# Patient Record
Sex: Male | Born: 1940
Health system: Southern US, Community
[De-identification: ages and names within clinical notes are randomized; demographics above are authoritative.]

## PROBLEM LIST (undated history)

## (undated) DIAGNOSIS — E139 Other specified diabetes mellitus without complications: Secondary | ICD-10-CM

## (undated) DIAGNOSIS — E1042 Type 1 diabetes mellitus with diabetic polyneuropathy: Secondary | ICD-10-CM

## (undated) DIAGNOSIS — E785 Hyperlipidemia, unspecified: Secondary | ICD-10-CM

## (undated) DIAGNOSIS — N183 Chronic kidney disease, stage 3 unspecified: Secondary | ICD-10-CM

## (undated) DIAGNOSIS — K274 Chronic or unspecified peptic ulcer, site unspecified, with hemorrhage: Secondary | ICD-10-CM

## (undated) DIAGNOSIS — Z951 Presence of aortocoronary bypass graft: Secondary | ICD-10-CM

## (undated) DIAGNOSIS — R0609 Other forms of dyspnea: Secondary | ICD-10-CM

## (undated) DIAGNOSIS — I350 Nonrheumatic aortic (valve) stenosis: Secondary | ICD-10-CM

## (undated) DIAGNOSIS — R600 Localized edema: Secondary | ICD-10-CM

## (undated) DIAGNOSIS — J189 Pneumonia, unspecified organism: Secondary | ICD-10-CM

## (undated) DIAGNOSIS — I10 Essential (primary) hypertension: Secondary | ICD-10-CM

## (undated) DIAGNOSIS — G4733 Obstructive sleep apnea (adult) (pediatric): Secondary | ICD-10-CM

## (undated) DIAGNOSIS — M109 Gout, unspecified: Secondary | ICD-10-CM

## (undated) DIAGNOSIS — I639 Cerebral infarction, unspecified: Secondary | ICD-10-CM

## (undated) DIAGNOSIS — I219 Acute myocardial infarction, unspecified: Secondary | ICD-10-CM

## (undated) DIAGNOSIS — R1310 Dysphagia, unspecified: Secondary | ICD-10-CM

## (undated) DIAGNOSIS — G629 Polyneuropathy, unspecified: Secondary | ICD-10-CM

## (undated) DIAGNOSIS — E6609 Other obesity due to excess calories: Secondary | ICD-10-CM

## (undated) DIAGNOSIS — R413 Other amnesia: Secondary | ICD-10-CM

## (undated) DIAGNOSIS — IMO0001 Reserved for inherently not codable concepts without codable children: Secondary | ICD-10-CM

## (undated) DIAGNOSIS — E119 Type 2 diabetes mellitus without complications: Secondary | ICD-10-CM

## (undated) DIAGNOSIS — I2510 Atherosclerotic heart disease of native coronary artery without angina pectoris: Secondary | ICD-10-CM

## (undated) DIAGNOSIS — I5032 Chronic diastolic (congestive) heart failure: Secondary | ICD-10-CM

## (undated) DIAGNOSIS — D696 Thrombocytopenia, unspecified: Secondary | ICD-10-CM

## (undated) DIAGNOSIS — E039 Hypothyroidism, unspecified: Secondary | ICD-10-CM

## (undated) DIAGNOSIS — M6282 Rhabdomyolysis: Secondary | ICD-10-CM

## (undated) DIAGNOSIS — E669 Obesity, unspecified: Secondary | ICD-10-CM

## (undated) DIAGNOSIS — Z794 Long term (current) use of insulin: Secondary | ICD-10-CM

## (undated) DIAGNOSIS — I872 Venous insufficiency (chronic) (peripheral): Secondary | ICD-10-CM

## (undated) DIAGNOSIS — I358 Other nonrheumatic aortic valve disorders: Secondary | ICD-10-CM

## (undated) DIAGNOSIS — R531 Weakness: Secondary | ICD-10-CM

## (undated) DIAGNOSIS — M21372 Foot drop, left foot: Secondary | ICD-10-CM

## (undated) DIAGNOSIS — Z9989 Dependence on other enabling machines and devices: Secondary | ICD-10-CM

## (undated) DIAGNOSIS — Z9289 Personal history of other medical treatment: Secondary | ICD-10-CM

## (undated) DIAGNOSIS — C801 Malignant (primary) neoplasm, unspecified: Secondary | ICD-10-CM

## (undated) HISTORY — DX: Atherosclerotic heart disease of native coronary artery without angina pectoris: I25.10

## (undated) HISTORY — DX: Other obesity due to excess calories: E66.09

## (undated) HISTORY — DX: Obstructive sleep apnea (adult) (pediatric): Z99.89

## (undated) HISTORY — DX: Hyperlipidemia, unspecified: E78.5

## (undated) HISTORY — DX: Obstructive sleep apnea (adult) (pediatric): G47.33

## (undated) HISTORY — PX: COLONOSCOPY: SHX174

## (undated) HISTORY — DX: Other amnesia: R41.3

## (undated) HISTORY — PX: SINUS ENDO W/FUSION: SHX777

## (undated) HISTORY — DX: Essential (primary) hypertension: I10

## (undated) HISTORY — DX: Polyneuropathy, unspecified: G62.9

## (undated) HISTORY — DX: Type 2 diabetes mellitus without complications: E11.9

## (undated) HISTORY — DX: Venous insufficiency (chronic) (peripheral): I87.2

## (undated) HISTORY — PX: ROTATOR CUFF REPAIR: SHX139

## (undated) HISTORY — DX: Long term (current) use of insulin: Z79.4

## (undated) HISTORY — PX: EYE SURGERY: SHX253

## (undated) HISTORY — PX: TONSILLECTOMY: SUR1361

## (undated) HISTORY — DX: Rhabdomyolysis: M62.82

## (undated) HISTORY — DX: Foot drop, left foot: M21.372

## (undated) HISTORY — PX: CHOLECYSTECTOMY: SHX55

## (undated) HISTORY — DX: Personal history of other medical treatment: Z92.89

## (undated) HISTORY — DX: Reserved for inherently not codable concepts without codable children: IMO0001

## (undated) HISTORY — DX: Cerebral infarction, unspecified: I63.9

## (undated) HISTORY — DX: Gout, unspecified: M10.9

---

## 1989-09-21 HISTORY — PX: CORONARY ANGIOPLASTY: SHX604

## 1996-10-13 HISTORY — PX: CORONARY ANGIOPLASTY: SHX604

## 1997-12-03 HISTORY — PX: CORONARY ANGIOPLASTY: SHX604

## 1999-02-01 ENCOUNTER — Ambulatory Visit (HOSPITAL_COMMUNITY): Admission: RE | Admit: 1999-02-01 | Discharge: 1999-02-01 | Payer: Self-pay | Admitting: Neurological Surgery

## 1999-02-01 ENCOUNTER — Encounter: Payer: Self-pay | Admitting: Neurological Surgery

## 1999-02-21 ENCOUNTER — Encounter: Payer: Self-pay | Admitting: Neurological Surgery

## 1999-02-25 ENCOUNTER — Inpatient Hospital Stay (HOSPITAL_COMMUNITY): Admission: RE | Admit: 1999-02-25 | Discharge: 1999-02-25 | Payer: Self-pay | Admitting: Neurological Surgery

## 1999-02-25 ENCOUNTER — Encounter: Payer: Self-pay | Admitting: Neurological Surgery

## 1999-05-19 ENCOUNTER — Ambulatory Visit (HOSPITAL_COMMUNITY): Admission: RE | Admit: 1999-05-19 | Discharge: 1999-05-19 | Payer: Self-pay | Admitting: Neurological Surgery

## 1999-05-19 ENCOUNTER — Encounter: Payer: Self-pay | Admitting: Neurological Surgery

## 1999-06-30 ENCOUNTER — Ambulatory Visit (HOSPITAL_COMMUNITY): Admission: RE | Admit: 1999-06-30 | Discharge: 1999-07-01 | Payer: Self-pay | Admitting: Neurological Surgery

## 1999-06-30 ENCOUNTER — Encounter: Payer: Self-pay | Admitting: Neurological Surgery

## 1999-10-14 ENCOUNTER — Encounter: Payer: Self-pay | Admitting: Cardiovascular Disease

## 1999-10-14 ENCOUNTER — Ambulatory Visit (HOSPITAL_COMMUNITY): Admission: RE | Admit: 1999-10-14 | Discharge: 1999-10-14 | Payer: Self-pay | Admitting: Cardiovascular Disease

## 1999-10-14 HISTORY — PX: CORONARY ANGIOPLASTY: SHX604

## 1999-10-20 ENCOUNTER — Encounter: Admission: RE | Admit: 1999-10-20 | Discharge: 1999-10-20 | Payer: Self-pay | Admitting: Cardiovascular Disease

## 1999-10-20 ENCOUNTER — Encounter: Payer: Self-pay | Admitting: Cardiovascular Disease

## 1999-10-29 ENCOUNTER — Encounter: Admission: RE | Admit: 1999-10-29 | Discharge: 1999-10-29 | Payer: Self-pay | Admitting: Neurological Surgery

## 1999-10-29 ENCOUNTER — Encounter: Payer: Self-pay | Admitting: Neurological Surgery

## 2000-02-18 ENCOUNTER — Encounter: Admission: RE | Admit: 2000-02-18 | Discharge: 2000-02-18 | Payer: Self-pay | Admitting: Family Medicine

## 2000-02-18 ENCOUNTER — Encounter: Payer: Self-pay | Admitting: Family Medicine

## 2000-03-10 ENCOUNTER — Encounter: Admission: RE | Admit: 2000-03-10 | Discharge: 2000-03-10 | Payer: Self-pay | Admitting: Family Medicine

## 2000-03-10 ENCOUNTER — Encounter: Payer: Self-pay | Admitting: Family Medicine

## 2000-04-09 ENCOUNTER — Encounter: Payer: Self-pay | Admitting: Family Medicine

## 2000-04-09 ENCOUNTER — Encounter: Admission: RE | Admit: 2000-04-09 | Discharge: 2000-04-09 | Payer: Self-pay | Admitting: Family Medicine

## 2000-10-07 ENCOUNTER — Encounter: Admission: RE | Admit: 2000-10-07 | Discharge: 2001-01-05 | Payer: Self-pay | Admitting: Internal Medicine

## 2000-12-14 HISTORY — PX: BACK SURGERY: SHX140

## 2000-12-30 ENCOUNTER — Ambulatory Visit (HOSPITAL_COMMUNITY): Admission: RE | Admit: 2000-12-30 | Discharge: 2000-12-30 | Payer: Self-pay | Admitting: Neurological Surgery

## 2000-12-30 ENCOUNTER — Encounter: Payer: Self-pay | Admitting: Neurological Surgery

## 2001-01-11 ENCOUNTER — Encounter: Payer: Self-pay | Admitting: Neurological Surgery

## 2001-01-13 ENCOUNTER — Observation Stay (HOSPITAL_COMMUNITY): Admission: RE | Admit: 2001-01-13 | Discharge: 2001-01-14 | Payer: Self-pay | Admitting: Neurological Surgery

## 2001-02-09 ENCOUNTER — Encounter: Payer: Self-pay | Admitting: Neurological Surgery

## 2001-02-09 ENCOUNTER — Encounter: Admission: RE | Admit: 2001-02-09 | Discharge: 2001-02-09 | Payer: Self-pay | Admitting: Neurological Surgery

## 2001-06-28 ENCOUNTER — Inpatient Hospital Stay (HOSPITAL_COMMUNITY): Admission: RE | Admit: 2001-06-28 | Discharge: 2001-06-29 | Payer: Self-pay | Admitting: Neurological Surgery

## 2001-06-28 ENCOUNTER — Encounter: Payer: Self-pay | Admitting: Neurological Surgery

## 2001-09-09 ENCOUNTER — Encounter: Admission: RE | Admit: 2001-09-09 | Discharge: 2001-09-09 | Payer: Self-pay | Admitting: Neurological Surgery

## 2001-09-09 ENCOUNTER — Encounter: Payer: Self-pay | Admitting: Neurological Surgery

## 2002-06-01 ENCOUNTER — Encounter: Admission: RE | Admit: 2002-06-01 | Discharge: 2002-06-01 | Payer: Self-pay | Admitting: Family Medicine

## 2002-06-01 ENCOUNTER — Encounter: Payer: Self-pay | Admitting: Family Medicine

## 2002-08-17 ENCOUNTER — Ambulatory Visit (HOSPITAL_BASED_OUTPATIENT_CLINIC_OR_DEPARTMENT_OTHER): Admission: RE | Admit: 2002-08-17 | Discharge: 2002-08-17 | Payer: Self-pay | Admitting: Family Medicine

## 2002-10-03 ENCOUNTER — Encounter (HOSPITAL_BASED_OUTPATIENT_CLINIC_OR_DEPARTMENT_OTHER): Admission: RE | Admit: 2002-10-03 | Discharge: 2003-01-01 | Payer: Self-pay | Admitting: Internal Medicine

## 2002-10-26 ENCOUNTER — Ambulatory Visit (HOSPITAL_BASED_OUTPATIENT_CLINIC_OR_DEPARTMENT_OTHER): Admission: RE | Admit: 2002-10-26 | Discharge: 2002-10-26 | Payer: Self-pay | Admitting: Family Medicine

## 2003-03-05 ENCOUNTER — Encounter (HOSPITAL_BASED_OUTPATIENT_CLINIC_OR_DEPARTMENT_OTHER): Admission: RE | Admit: 2003-03-05 | Discharge: 2003-06-03 | Payer: Self-pay | Admitting: Internal Medicine

## 2003-06-11 ENCOUNTER — Encounter (HOSPITAL_BASED_OUTPATIENT_CLINIC_OR_DEPARTMENT_OTHER): Admission: RE | Admit: 2003-06-11 | Discharge: 2003-06-12 | Payer: Self-pay | Admitting: Internal Medicine

## 2003-09-13 ENCOUNTER — Encounter (HOSPITAL_BASED_OUTPATIENT_CLINIC_OR_DEPARTMENT_OTHER): Admission: RE | Admit: 2003-09-13 | Discharge: 2003-12-12 | Payer: Self-pay | Admitting: Internal Medicine

## 2004-01-03 ENCOUNTER — Encounter (HOSPITAL_BASED_OUTPATIENT_CLINIC_OR_DEPARTMENT_OTHER): Admission: RE | Admit: 2004-01-03 | Discharge: 2004-01-15 | Payer: Self-pay | Admitting: Internal Medicine

## 2004-03-11 ENCOUNTER — Ambulatory Visit (HOSPITAL_COMMUNITY): Admission: RE | Admit: 2004-03-11 | Discharge: 2004-03-11 | Payer: Self-pay | Admitting: Cardiovascular Disease

## 2004-04-03 ENCOUNTER — Inpatient Hospital Stay (HOSPITAL_COMMUNITY): Admission: EM | Admit: 2004-04-03 | Discharge: 2004-04-06 | Payer: Self-pay | Admitting: Emergency Medicine

## 2004-04-04 HISTORY — PX: CORONARY ANGIOPLASTY WITH STENT PLACEMENT: SHX49

## 2004-04-16 ENCOUNTER — Encounter (HOSPITAL_BASED_OUTPATIENT_CLINIC_OR_DEPARTMENT_OTHER): Admission: RE | Admit: 2004-04-16 | Discharge: 2004-04-24 | Payer: Self-pay | Admitting: Internal Medicine

## 2004-04-24 ENCOUNTER — Observation Stay (HOSPITAL_COMMUNITY): Admission: EM | Admit: 2004-04-24 | Discharge: 2004-04-25 | Payer: Self-pay | Admitting: Emergency Medicine

## 2004-06-19 ENCOUNTER — Encounter: Admission: RE | Admit: 2004-06-19 | Discharge: 2004-06-19 | Payer: Self-pay | Admitting: Family Medicine

## 2004-06-29 ENCOUNTER — Encounter: Admission: RE | Admit: 2004-06-29 | Discharge: 2004-06-29 | Payer: Self-pay | Admitting: Family Medicine

## 2004-07-24 ENCOUNTER — Encounter (HOSPITAL_BASED_OUTPATIENT_CLINIC_OR_DEPARTMENT_OTHER): Admission: RE | Admit: 2004-07-24 | Discharge: 2004-08-01 | Payer: Self-pay | Admitting: Internal Medicine

## 2004-10-28 ENCOUNTER — Encounter (HOSPITAL_BASED_OUTPATIENT_CLINIC_OR_DEPARTMENT_OTHER): Admission: RE | Admit: 2004-10-28 | Discharge: 2004-11-26 | Payer: Self-pay | Admitting: Internal Medicine

## 2004-11-14 DIAGNOSIS — I2510 Atherosclerotic heart disease of native coronary artery without angina pectoris: Secondary | ICD-10-CM | POA: Insufficient documentation

## 2004-11-21 ENCOUNTER — Encounter: Admission: RE | Admit: 2004-11-21 | Discharge: 2004-11-21 | Payer: Self-pay | Admitting: Neurological Surgery

## 2005-02-09 ENCOUNTER — Encounter (HOSPITAL_BASED_OUTPATIENT_CLINIC_OR_DEPARTMENT_OTHER): Admission: RE | Admit: 2005-02-09 | Discharge: 2005-02-20 | Payer: Self-pay | Admitting: Internal Medicine

## 2005-05-29 ENCOUNTER — Ambulatory Visit (HOSPITAL_COMMUNITY): Admission: RE | Admit: 2005-05-29 | Discharge: 2005-05-29 | Payer: Self-pay | Admitting: Family Medicine

## 2005-06-03 ENCOUNTER — Inpatient Hospital Stay (HOSPITAL_COMMUNITY): Admission: EM | Admit: 2005-06-03 | Discharge: 2005-06-09 | Payer: Self-pay | Admitting: Emergency Medicine

## 2005-06-07 ENCOUNTER — Encounter (INDEPENDENT_AMBULATORY_CARE_PROVIDER_SITE_OTHER): Payer: Self-pay | Admitting: Specialist

## 2005-07-24 ENCOUNTER — Ambulatory Visit (HOSPITAL_COMMUNITY): Admission: RE | Admit: 2005-07-24 | Discharge: 2005-07-24 | Payer: Self-pay | Admitting: Internal Medicine

## 2006-07-13 ENCOUNTER — Emergency Department (HOSPITAL_COMMUNITY): Admission: EM | Admit: 2006-07-13 | Discharge: 2006-07-13 | Payer: Self-pay | Admitting: Emergency Medicine

## 2006-09-28 ENCOUNTER — Encounter: Admission: RE | Admit: 2006-09-28 | Discharge: 2006-09-28 | Payer: Self-pay | Admitting: Family Medicine

## 2006-12-22 ENCOUNTER — Encounter: Admission: RE | Admit: 2006-12-22 | Discharge: 2006-12-22 | Payer: Self-pay | Admitting: Family Medicine

## 2007-01-16 ENCOUNTER — Inpatient Hospital Stay (HOSPITAL_COMMUNITY): Admission: EM | Admit: 2007-01-16 | Discharge: 2007-02-02 | Payer: Self-pay | Admitting: Emergency Medicine

## 2007-01-16 ENCOUNTER — Ambulatory Visit: Payer: Self-pay | Admitting: Emergency Medicine

## 2007-01-17 ENCOUNTER — Encounter (INDEPENDENT_AMBULATORY_CARE_PROVIDER_SITE_OTHER): Payer: Self-pay | Admitting: Cardiovascular Disease

## 2007-02-01 ENCOUNTER — Ambulatory Visit: Payer: Self-pay | Admitting: Physical Medicine & Rehabilitation

## 2007-02-02 ENCOUNTER — Inpatient Hospital Stay (HOSPITAL_COMMUNITY)
Admission: RE | Admit: 2007-02-02 | Discharge: 2007-02-16 | Payer: Self-pay | Admitting: Physical Medicine & Rehabilitation

## 2007-03-21 ENCOUNTER — Encounter: Admission: RE | Admit: 2007-03-21 | Discharge: 2007-06-19 | Payer: Self-pay | Admitting: Family Medicine

## 2007-03-23 ENCOUNTER — Encounter
Admission: RE | Admit: 2007-03-23 | Discharge: 2007-06-21 | Payer: Self-pay | Admitting: Physical Medicine & Rehabilitation

## 2007-03-23 ENCOUNTER — Ambulatory Visit: Payer: Self-pay | Admitting: Physical Medicine & Rehabilitation

## 2007-05-12 ENCOUNTER — Encounter: Admission: RE | Admit: 2007-05-12 | Discharge: 2007-05-12 | Payer: Self-pay | Admitting: Endocrinology

## 2007-05-16 ENCOUNTER — Ambulatory Visit: Payer: Self-pay | Admitting: Physical Medicine & Rehabilitation

## 2007-06-20 ENCOUNTER — Encounter: Admission: RE | Admit: 2007-06-20 | Discharge: 2007-09-18 | Payer: Self-pay | Admitting: Family Medicine

## 2007-07-11 ENCOUNTER — Ambulatory Visit: Payer: Self-pay | Admitting: Physical Medicine & Rehabilitation

## 2007-07-11 ENCOUNTER — Encounter
Admission: RE | Admit: 2007-07-11 | Discharge: 2007-09-13 | Payer: Self-pay | Admitting: Physical Medicine & Rehabilitation

## 2007-07-12 ENCOUNTER — Encounter: Admission: RE | Admit: 2007-07-12 | Discharge: 2007-10-10 | Payer: Self-pay | Admitting: Family Medicine

## 2007-12-05 ENCOUNTER — Encounter
Admission: RE | Admit: 2007-12-05 | Discharge: 2008-02-23 | Payer: Self-pay | Admitting: Physical Medicine & Rehabilitation

## 2007-12-05 ENCOUNTER — Ambulatory Visit: Payer: Self-pay | Admitting: Physical Medicine & Rehabilitation

## 2008-01-30 ENCOUNTER — Encounter
Admission: RE | Admit: 2008-01-30 | Discharge: 2008-04-29 | Payer: Self-pay | Admitting: Physical Medicine & Rehabilitation

## 2008-01-30 ENCOUNTER — Ambulatory Visit: Payer: Self-pay | Admitting: Physical Medicine & Rehabilitation

## 2008-03-21 ENCOUNTER — Ambulatory Visit: Payer: Self-pay | Admitting: Physical Medicine & Rehabilitation

## 2010-02-03 ENCOUNTER — Encounter: Admission: RE | Admit: 2010-02-03 | Discharge: 2010-02-03 | Payer: Self-pay | Admitting: Neurological Surgery

## 2010-05-05 ENCOUNTER — Encounter: Admission: RE | Admit: 2010-05-05 | Discharge: 2010-05-05 | Payer: Self-pay | Admitting: Rheumatology

## 2010-07-01 ENCOUNTER — Encounter: Admission: RE | Admit: 2010-07-01 | Discharge: 2010-07-01 | Payer: Self-pay | Admitting: Rheumatology

## 2010-07-22 ENCOUNTER — Encounter: Admission: RE | Admit: 2010-07-22 | Discharge: 2010-07-22 | Payer: Self-pay | Admitting: Rheumatology

## 2010-08-11 ENCOUNTER — Encounter
Admission: RE | Admit: 2010-08-11 | Discharge: 2010-08-11 | Payer: Self-pay | Source: Home / Self Care | Admitting: Endocrinology

## 2011-04-28 NOTE — Assessment & Plan Note (Signed)
The patient is back regarding his pain complaints.  His left ribs are  doing much better after the injection.  His pain is 3/10.  His biggest  problem has been left leg swelling.  He is wearing a compression  stocking, although, he is still having issues.  He came off the  Neurontin and that has helped his swelling a bit, but still symptoms are  there.  He does not know that he is on anything else that would  exacerbate swelling symptoms.  He rates his pain as 3/10 today.  Sleep  is good.  He does have some occasional pain in the right leg when he  walks for longer distances.  He uses his double upright AFO for gait and  stability at the ankle.   REVIEW OF SYSTEMS:  Notable for the above as well as numbness, trouble  walking, easy bleeding.  Full review is in the written health and  history section of the chart.   SOCIAL HISTORY:  Without change.   PHYSICAL EXAMINATION:  VITAL SIGNS:  Blood pressure 142/53, pulse 55,  respiratory rate 16, and saturations 97% on room air.  GENERAL:  The patient is pleasant, alert and oriented x3.  He remains  overweight.  He walks with double upright AFO, still favors the left  side.  Left leg has 2+ edema below the knee.  He has a below the knee  TED stocking in place.  The end of the stocking and the top of his brace  tends to tourniquet his leg effectively.  The sensation is decreased at  1-2 in the sciatic nerve distribution.  He does have persistent weakness  in ankle dorsiflexion and plantar flexion today.  Rib cage is minimally  tender on the right.  Right leg is generally intact.  HEART:  Regular.  CHEST:  Clear.  ABDOMEN:  Soft and nontender.  Cognitively he is appropriate.   ASSESSMENT:  1. Right rotator cuff syndrome.  2. Left sciatic nerve injury.  3. Right leg length discrepancy.  4. Right rib pain improved after injection.   PLAN:  1. Continue ice/stretching for right rib pain.  I gave him Percocet to      use for severe  breakthrough symptoms, although, he uses this      sparingly.  This will replace Oxycodone which he was not able to      fill previously due to a shortage.  2. I discussed left lower extremity edema.  I know that the Neurontin      likely led to some of this, although, his persistent symptoms are      probably a combination of factors including venous insufficiency,      persistent leg weakness, and the setup he has with his brace and      stocking.  I would move to a thigh high stocking to avoid the      bunching at popliteal crease.  He also needs to release his Velcro      strap at the knee a bit when he is resting to take pressure off and      keep from creating a tourniquet effect.  I also recommended      elevation of the leg      during the day and on a pillow at night.  He will continue with his      diuretics per primary medical team.  3. I will see him back on as-needed basis in the future.  [name], M.D.  Electronically Signed     ZTS/MedQ  D:  03/21/2008 11:07:05  T:  03/21/2008 11:38:05  Job #:  [id number]   cc:   Brooke Bonito, M.D.  Fax: [phone]   Bryan Lemma. Manus Gunning, M.D.  Fax: [phone]

## 2011-04-28 NOTE — Assessment & Plan Note (Signed)
Randall Wilkins is here in followup of his pain issues.  He had been doing  well until recently when the right shoulder and flank have been acting  up again.  We have performed shoulder and rib injections last year with  good results.  Today, he is wearing his AFO and shoe build-ups.  He was  advised to stay active.  He does some exercise on a regular basis during  the week.  He can walk 20 to 30 minutes without having to stop.  He  rates his pain as 7/10.  He describes it as sharp, sometimes aching.  He  has been on Neurontin recently for neuropathy.  He states that he was  placed on this for the numbness, although he does have some occasional  pain, which he feels like it provides some benefit for.  Sleep is  reasonably good.   REVIEW OF SYSTEMS:  He notes troube with walking, numbness, weight gain.  Other pertinent positives are listed above.  He is currently on Plavix  and aspirin as opposed to Coumadin for any coagulation.   SOCIAL HISTORY:  Patient is married and is without significant change.   PHYSICAL EXAMINATION:  VITAL SIGNS:  Blood pressure is 159/53, pulse is  60, respiratory rate 18, he is sating at 94% on room air.  GENERAL:  Patient is pleasant, alert, and oriented x3.  He walks with  some rotation of the pelvis and problems with the advancement of the  left gait, but gait is generally functional.  PELVIS:  Pelvis is neutral in position.  He has some pain along the 11th  rib with palpation on the right side near the mid axillary line.  May  have been soft tissue discomfort as well.  Pain was worse with bending  to the left.  RIGHT SHOULDER:  Notable for decreased range of motion with flexion and  rotation today.  He had pain with impingement maneuvers today as well.  Bicipital tendons were minimally uncomfortable.  NEUROLOGICAL EXAM IN THE LEFT LOWER EXTREMITY:  Stable.  HEART:  Regular.  CHEST:  Clear.  ABDOMEN:  Soft, nontender.  Patient is overweight.   ASSESSMENT:  1. Right rotator cuff syndrome.  2. Left sciatic nerve injury.  3. Right leg length discrepancy.  4. Myofascial and postural abnormalities associated with gait, leading      to right rib and flank pain.   PLAN:  1. After informed consent, we injected the right shoulder via      posterior approach in the right 11th rib using 1% lidocaine and      Kenalog.  Patient tolerated it well.  We used 3-in-1 combination      and a 2-in-0.75 combination in the respective areas.  The patient      advised to work on shoulder range of motion at home.  Continue with      bracing and build-up as well.  2. I gave him a few oxycodone for breakthrough pain.  3. I will see him back in the office in about 2 months' time.      [name], M.D.  Electronically Signed     ZTS/MedQ  D:  12/06/2007 09:40:54  T:  12/06/2007 11:03:04  Job #:  [id number]   cc:   Brooke Bonito, M.D.  Fax: [phone]   Bryan Lemma. Manus Gunning, M.D.  Fax: [phone]

## 2011-04-28 NOTE — Assessment & Plan Note (Signed)
HISTORY OF PRESENT ILLNESS:  Randall Wilkins is back regarding his pain. His  right shoulder is doing well. He had a month and a half of relief with  the last rib injection. The rib pain is back, particularly in the  morning, when he first awakens. The pain is a 6 out of 10 in intensity.  Describes it as aching and stabbing at times. Sleep is good, otherwise,  until he first gets up in the morning, when the pain starts. He has some  numbness still in the left foot. He wears his AFO for his peroneal  neuropathy. He continues to exercise as tolerated.   REVIEW OF SYSTEMS:  Notable for the above. Full review as in the written  health and history section of the chart. He is following up with his  family physician regarding __________ and hypertension.   SOCIAL HISTORY:  The patient is married. Lives with his wife. No other  changes are noted.   PHYSICAL EXAMINATION:  VITAL SIGNS:  Blood pressure 156/59, pulse 59,  respiratory rate 18, sating 99% on room air.  GENERAL:  Pleasant, alert, oriented x3. Affect is bright and  appropriate. He walks with a double upright AFO and has a slight limp,  veering to the left side. He continues to have pain along the 11th rib  on the right, near the axillary line. The pain is worse with deep  palpation and twisting. Soft tissue is less tender around the area.  Right shoulder is notable with decreased range of motion  but improved  pain intolerance today. Mild impingement signs are noted as well as mild  bicipital tenderness.  HEART:  Regular.  CHEST:  Clear.  ABDOMEN:  Soft, nontender.  NEUROLOGIC:  Cognitively, he is intact.   ASSESSMENT:  1. Right rotator cuff syndrome.  2. Left sciatic nerve injury.  3. Right leg length discrepancy.  4. Ongoing right 11th rib pain, likely due to occult trauma or      fracture, related to his fall.   PLAN:  1. After informed consent, we injected at the right 11th rib,      performing an intercostal nerve block. The  patient tolerated well      and had relief of his pain before he left the room today.  2. Will add Skelaxin 400 mg at bedtime for night time spasm, to see if      this provides him any relief, if pain should return.  3. Encourage activity and appropriate diet and exercise.  4. I will see him back in 2 months.      [name], M.D.  Electronically Signed     ZTS/MedQ  D:  01/31/2008 09:34:21  T:  01/31/2008 18:38:08  Job #:  [id number]   cc:   Brooke Bonito, M.D.  Fax: [phone]

## 2011-04-28 NOTE — Assessment & Plan Note (Signed)
Randall Wilkins is back regarding his deconditioning.  Since I last saw him he has  had some increasing pain in his right shoulder and right low back.  He  said he was being examined by physical therapy, found a 1/4 to 1/2-inch  leg length discrepancy on the right, and he was fitted for a shoe  buildup, which apparently he tells me he is not wearing the shoes  however today.  The pain worsens when he uses his walker or his cane.  He is graduating off the walker to the cane in general.  The right  shoulder has been bothering him for about a month.  He does have a  history of prior back surgeries which have caused him pain in the past.  He denies any weakness or numbness in the right leg.  His left ankle  seems to be improving from a motor standpoint, although he still has  some numbness there.   REVIEW OF SYSTEMS:  Notable for the above.  Full review is in the  health and history section.  Patient does report that he is coming off  Coumadin per Cardiology's advice.   SOCIAL HISTORY:  Patient is married, lives with his wife without any  current issues.  His wife works.   PHYSICAL EXAM:  VITAL SIGNS:  Blood pressure is 160/79, pulse of 64,  respiratory rate 17.  He is satting 95% on room air.  MUSCULOSKELETAL:  Patient is walking with a straight cane.  He is using his off-the-shelf  left AFO.  He may have some trace ankle dorsiflexion today to 1/5  dorsiflexion.  Plantar flexion is trace to 1+.  Sensation remains  decreased in the peroneal and tibial distributions.  Right hip is lower  than the left on examination today by about half an inch.  He has pain  along the right flank along the 12th rib edge.  Pain is worse with deep  palpation as well as bending to that side.  Right shoulder was notable  for impingement signs and decreased range of motion with blocking in  passive abduction.  HEART:  Was regular.  CHEST:  Was clear.  ABDOMEN:  Soft, nontender.  NEURO:  Cognitively he is intact.   ASSESSMENT:  1. History of deconditioning.  2. Right rotator cuff syndrome.  3. Sciatic nerve injury with left foot drop.  4. Right myofascial pain related to postural abnormalities, leg length      discrepancy, prior back surgeries, etc.   PLAN:  1. After informed consent we injected the right shoulder via posterior      approach with 40 mg of Kenalog and 3 mL of 1% lidocaine.  2. I also injected the right flank musculature along the right 12th      rib for pain relief.  Hopefully we can encourage appropriate      posture and positioning by decreasing his pain.  He needs to have a      shoe adaptation on at all times to account for his leg length      difference.  He needs to work on lumbar flexion and stretching      exercises as well.  He can do this on his own through a home      exercise program.  3. I will see the patient back in about 2 months' time.  Overall he      has done quite nicely.      Randall Wilkins, M.D.  Electronically Signed  ZTS/MedQ  D:  05/17/2007 11:37:48  T:  05/17/2007 12:12:58  Job #:  [id number]   cc:   Gerlene Burdock A. Alanda Amass, M.D.  Fax: [phone]   Bethann Berkshire, Dr.

## 2011-04-28 NOTE — Assessment & Plan Note (Signed)
[name] is back regarding his deconditioning.  He has been fitted  with a left double upright AFO which is accommodating his skin needs  better.  He benefitted from the right shoulder and rib injections last  visit, but he still has a few catches but is moving them better.  Overall his balance has improved.  He is hopeful that he will have some  more return in his motor function but has not really seen a substantial  change in his foot movement since last visit.   REVIEW OF SYSTEMS:  Review of systems is notable for numbness, trouble  walking.  The pertinent positive list above and full review is in the  health and history section.   SOCIAL HISTORY:  Patient is married, living with his wife.   PHYSICAL EXAMINATION:  VITAL SIGNS:  Blood pressure is 171/77, pulse is  65, respiratory rate 18, he is saturating 95% on room air.  The patient  is pleasant, alert and oriented x3.  MUSCULOSKELETAL:  His pelvic positioning is more neutral today.  It does  seem as if he steps down into the left leg now when he walks.  Ankle  dorsiflexion remains 1out of 5 today.  Plantar flexion is 1+ out of 5.  He continues to have decreased sensation of the distal leg.  Right  shoulder range of motion has improved with 90-degrees of abduction today  with some discomfort.  He had some mild tenderness over the right flank.  Cognition was appropriate.  HEART:  Regular.  CHEST:  Clear.  ABDOMEN:  Soft, nontender.   ASSESSMENT:  1. Right rotator cuff syndrome.  2. Sciatic nerve injury on the left.  3. Right leg length discrepancy with shoe buildup in place.  4. Myofascial and postural abnormalities in the right flank and low      back.   PLAN:  1. Continue with range of motion and exercising at home to improve      endurance and function.  2. Continue with double upright AFO on left side, as well as shoe      buildup on the right.  3. Patient should follow up with his cardiologist regarding blood  pressure and heart issues.  He seems to be doing generally well.      Otherwise I will see him back on an as-needed basis in the future.      [name], M.D.  Electronically Signed     ZTS/MedQ  D:  07/12/2007 13:07:21  T:  07/13/2007 09:24:35  Job #:  [id number]   cc:   Gerlene Burdock A. Alanda Amass, M.D.  Fax: [phone]   Bryan Lemma. Manus Gunning, M.D.  Fax: [phone]

## 2011-05-01 NOTE — Discharge Summary (Signed)
NAME:  Randall Wilkins, Randall Wilkins                ACCOUNT NO.:  [id number]   MEDICAL RECORD NO.:  [id number]          PATIENT TYPE:  IPS   LOCATION:  4039                         FACILITY:  [facility]   PHYSICIAN:  [name], M.D.DATE OF BIRTH:  [birth year]   DATE OF ADMISSION:  02/02/2007  DATE OF DISCHARGE:  02/16/2007                               DISCHARGE SUMMARY   DISCHARGE DIAGNOSES:  1. Deconditioning as well as ventilator-dependent respiratory failure.  2. Paroxysmal atrial fibrillation.  3. Rhabdomyolysis, resolved.  4. Insulin-dependent diabetes mellitus.  5. Hypertension.  6. Gout.  7. History of coronary artery disease.  8. Obstructive sleep apnea.   HISTORY:  This is a 70 year old white male with history of coronary  artery disease with insulin-dependent diabetes mellitus, admitted  February 3 with shortness of breath and chest congestion, chest x-ray  with pulmonary vascular congestion.  EKG with ST depressions,  nonspecific, with positive cardiac enzymes.  Creatinine 2.1 and placed  on intravenous fluids.  [facility] followup for bouts  of atrial fibrillation with Coumadin therapy initiated.  Echocardiogram  with ejection fraction 50% to 60% and normal left ventricular function.   VDRF, slowly extubated.  Suspect sepsis, placed on broad-spectrum  antibiotics, blood cultures negative.  Followup chest x-ray, January 29, 2007, with no significant effusion and decreased edema.  Renal  consult for elevated creatinine and responded well to intravenous  fluids.  Panda tube initially in place for nutrition, diet later  advanced to a mechanical soft.   PAST MEDICAL HISTORY:  See discharge diagnoses.   HABITS:  No alcohol.  Remote smoker.   ALLERGIES:  LYRICA, ACE INHIBITORS and ANGIOTENSIN RECEPTORS.   SOCIAL HISTORY:  Lives with his wife.  Wife works day shift.  Son to  assist during the day.  They live in a 1-level home.   MEDICATIONS PRIOR TO ADMISSION:  1. Aspirin 81 mg daily.  2. Niacin 1000 mg twice daily.  3. Norvasc 10 mg twice daily.  4. Plavix 75 mg daily.  5. Demadex 50 mg daily.  6. Allopurinol 300 mg daily.  7. Neurontin 600 mg three times daily.  8. Metoprolol 50 mg twice daily.  9. Humalog three times daily.  10.Lantus insulin 30 units in the a.m., 46 units in the p.m.  11.Morphine extended release 30 mg twice daily.   REHABILITATION HOSPITAL COURSE:  The patient was admitted to inpatient  rehab services with therapies initiated on a 3-hour-daily basis  consisting of physical therapy, occupational therapy, speech therapy and  rehabilitation nursing.  The following issues were addressed during the  patient's rehabilitation stay:  Pertaining to Randall Wilkins's  deconditioning as well as VDRF, he continued to progress in all areas of  functional mobility.  He was now minimal assistance for ambulation,  supervision for upper body dressing, moderate assist for lower body  dressing, minimal assistance for bathing.  He still had some minimal  assistance issues for basic expression and comprehension, needing some  cues for his overall recall.  During his hospital course, he remained on  Coumadin therapy for atrial fibrillation, followed  by [facility].  It was at the family's request he be followed by Dr.  Manus Gunning for his Coumadin therapy, who was contacted prior to the  patient's departure from the hospital.  His latest INR was 3.0 and  cardiac rate controlled.  Blood pressures were monitored on Lopressor,  lisinopril and Lasix with diastolic pressures 65-72.  His blood sugars  had some variables; his Lantus insulin was adjusted, latest blood sugars  129, 141 and 61.  He had a history of peripheral neuropathy related to  his diabetes mellitus with a left foot drop.  He was wearing a PRAFO  boot.  Close monitoring of skin with pressure release.  Santyl was being  used topically for skin maintenance.   Latest  labs showed an INR 3.0, hemoglobin 10.4, hematocrit 31.1,  platelet 331,000, WBC of 9.2; sodium 137, potassium 3.6, BUN 26,  creatinine 1.25.   DISCHARGE MEDICATIONS AT TIME OF DICTATION:  1. Coumadin, with latest dose of 10 mg, adjusted accordingly with goal      INR to be 2.0 to 3.0.  2. Lopressor 25 mg three times daily.  3. Lisinopril 10 mg daily.  4. Protonix 40 mg daily.  5. Potassium chloride 40 mEq daily.  6. Lasix 40 mg twice daily.  7. Flexeril 5 mg three times daily as needed -- spasms.  8. Lantus insulin 44 units in the a.m., 30 units in the p.m.  9. Tylenol as needed.  10.Vicodin 5/325 one or two tablets every 6 hours as needed for pain,      dispense of 60 tablets.   ACTIVITY:  As tolerated.  Patient advised no smoking, no drinking, no  driving.   FOLLOWUP:  With Dr. Manus Gunning for Coumadin therapy, with home health nurse  to be arranged; follow up with Dr. Alanda Amass, [facility], for any issues related to his atrial fibrillation.  much      Mariam Dollar, P.A.      [name], M.D.  Electronically Signed    DA/MEDQ  D:  02/15/2007  T:  02/15/2007  Job:  [id number]   cc:   Bryan Lemma. Manus Gunning, M.D.  Richard A. Alanda Amass, M.D.  Richard F. Caryn Section, M.D.

## 2011-05-01 NOTE — Consult Note (Signed)
NAME:  Randall Wilkins, Randall Wilkins                ACCOUNT NO.:  [id number]   MEDICAL RECORD NO.:  [id number]           PATIENT TYPE:   LOCATION:                               FACILITY:  [facility]   PHYSICIAN:  Anselm Pancoast. Weatherly, M.D.DATE OF BIRTH:  07-09-1941   DATE OF CONSULTATION:  06/06/2005  DATE OF DISCHARGE:                                   CONSULTATION   CONSULTING PHYSICIAN:  Anselm Pancoast. Zachery Dakins, M.D.   CHIEF COMPLAINT:  Abdominal pain secondary to acute cholecystitis.   HISTORY:  Randall Wilkins is a diabetic who was admitted to the hospitalist  service.  Dr. Donia Guiles is his regular physician.  And, he was seen in  the emergency room and I think a CT was performed that was thought to be  related to diverticulitis and was started on antibiotics orally and treated  for this.  However, the patient failed to improve and after approximately 3-  4 days of Cipro and Flagyl saw Dr. Donia Guiles and he suggested the  patient be admitted and was admitted to the hospitalist service, Dr.  Pincus Sanes service.  The patient is a diabetic longstanding on pretty high  dose insulin plus cardiac medications.  And, a CT was repeated now two days  later as his pain is more upper right upper quadrant and not lower  generalized.  He is a pretty large diabetic and the x-ray today really shows  an acute cholecystitis but no evidence of diverticulosis or diverticulitis.  The patient's white count today is not elevated.  It is 7,400 and his  glucose is mildly elevated at 217.  He has not had repeat liver function  studies.  They were normal when he was admitted on the 20th.  Today with the  change in the diagnosis, __________  asked me to see him for a surgical  consultation and I requested that he be seen to get cardiac clearance which  has been done.  The patient at this time is afebrile.  He is mildly tender  with deep palpation in the right upper quadrant but he is sitting up  visiting with his family and  certainly not had any acute problems.  He is on  Plavix and a baby aspirin daily.  But because of the worsening of his  gallbladder, I think that we ought to proceed on with a cholecystectomy and  cholangiogram which hopefully can be done tomorrow.  I do not think that he  has been on any type of Lovenox and his sugar is kind of chronically runs  about the 140 to 200 range, and it has been running about 140 today.  The  patient and his wife understand and agree to the planned procedure.  They  understand that with the aspirin and the Plavix, there may be a little more  bleeding than usual but since these medications would take a week to get out  of his system and he is not improving, we do not have that week but we will  add on plan on the OR tomorrow.  He has been cleared  by the cardiologist  that he is not having an acute heart problem.   He is a on a long list of medications for blood pressure, diabetes, and  hyperlipidemia, etcetera.   1.  The patient is presently on Zosyn which we will continue.  2.  I have added him to the OR schedule for a tentative laparoscopic      cholecystectomy and cholangiogram on Sunday morning.  3.  The patient will be placed NPO.  4.  I am going to repeat a B-MET, a C-MET, and coagulation study, and a CBC      in the morning prior to the planned surgery.           ______________________________  Anselm Pancoast. Zachery Dakins, M.D.     WJW/MEDQ  D:  06/06/2005  T:  06/07/2005  Job:  [id number]   cc:   Anselm Pancoast. Zachery Dakins, M.D.  [address], [address]  [place]  [place] [address]   patient's chart  Room [id number]

## 2011-05-01 NOTE — Discharge Summary (Signed)
NAME:  Randall Wilkins, Garion S                          ACCOUNT NO.:  [id number]   MEDICAL RECORD NO.:  [id number]                   PATIENT TYPE:  INP   LOCATION:  6526                                 FACILITY:  [facility]   PHYSICIAN:  Richard A. Alanda Amass, M.D.          DATE OF BIRTH:  1941-09-13   DATE OF ADMISSION:  04/24/2004  DATE OF DISCHARGE:  04/25/2004                                 DISCHARGE SUMMARY   [name] is a 70 year old Caucasian gentleman, patient of Dr. Alanda Amass,  presented to the emergency room yesterday in the afternoon, after he was  seen by Dr. Alanda Amass in the [place] office earlier the same day.  Recently, the patient underwent stenting of the RCA after abnormal  Cardiolite stress test.  The procedure was done at the end of April and  recent restenosis was suspected.  The patient was admitted for observation  and rule out MI protocol.  At the same time, his pain had a pleuritic and  pericarditic component, and the patient was feeling worse in recumbent  position.  Dr. Allyson Sabal, who assessed the patient on admission, suspected  possible pericarditis and administered Decadron injection.   We cycled serial cardiac enzymes and three sets of enzymes revealed negative  values.   EKG also did not show any acute changes.   Chest x-ray revealed bilateral atelectasis and changes compatible with  chronic bronchitis.   The next morning after admission, the patient felt improved and was pain  free and free of shortness of breath, and after Dr. Allyson Sabal assessed the  patient, the decision was made to discharge him home.   DISCHARGE MEDICATIONS:  There were no changes made to the medication  regimen.  The patient is to remain on the same medications which are:  1. Aspirin 81 mg every day.  2. Plavix 75 mg every day.  3. Lopressor 50 mg b.i.d.  4. Lipitor 40 mg every day.  5. Niacin 1000 mg every day.  6. Protonix 40 mg every day.  7. Norvasc 5 mg every day.  8. Demadex 20 mg  every day.  9. KCl 20 mEq every day.  10.      Colchicine 0.6 mg b.i.d.  11.      Allopurinol 300 mg every day.  12.      Insulin, resume home dose.   ACTIVITY:  As tolerated, no  __________.   DIET:  Low cholesterol and diabetic diet.   LABS:  As I mentioned, cardiac enzymes were negative.  Sodium 142, potassium  3.5, chloride 105, CO2 28, glucose 140, BUN 18, creatinine 1.6.  Liver  function test within normal limits.  CBC showed white blood cell count 7.8,  hemoglobin 13.7, hematocrit 41, and platelet count 164.   DISCHARGE FOLLOWUP:  Dr. Alanda Amass will see the patient on June 21st at  10:45 a.m., per Dr. Hazle Coca recommendation.      [place], [place].A.  Richard A. Alanda Amass, M.D.    MK/MEDQ  D:  04/25/2004  T:  04/26/2004  Job:  [id number]   cc:   [facility]

## 2011-05-01 NOTE — Discharge Summary (Signed)
NAME:  Randall Wilkins, Randall Wilkins                ACCOUNT NO.:  [id number]   MEDICAL RECORD NO.:  [id number]          PATIENT TYPE:  INP   LOCATION:  1511                         FACILITY:  [facility]   PHYSICIAN:  Kela Millin, M.D.DATE OF BIRTH:  04/18/1941   DATE OF ADMISSION:  06/02/2005  DATE OF DISCHARGE:  06/09/2005                           DISCHARGE SUMMARY - REFERRING   DISCHARGE DIAGNOSES:  1.  Acute cholecystitis, status post laparoscopic cholecystectomy.  2.  Diabetes mellitus.  3.  Coronary artery disease - history of myocardial infarction with stent      placement.  4.  Hypertension.  5.  History of chronic renal insufficiency.  6.  History of hyperlipidemia.  7.  Gout.  8.  ? question of congestive heart failure - per patient report, the      patient's ejection fraction per catheterization in April 2005 showed a      preserved ejection fraction.   PROCEDURES:  1.  Interval development of changes of acute cholecystitis.  2.  Laparoscopic cholecystectomy - per Dr. Zachery Dakins on June 07, 2005.   HISTORY OF PRESENT ILLNESS:  The patient is a 70 year old white male with  multiple medical problems who presented to the emergency room with  complaints of worsening abdominal pain. The patient had been diagnosed with  diverticulitis and discharged home from the emergency room on ciprofloxacin  and Flagyl for about 4 days. He had worsening abdominal pain on the a.m. of  presentation and came back to the emergency room. The patient reported that  the pain was across his upper abdomen. He denied diarrhea, also denied  hematochezia. The patient reported decreased appetite. Denied vomiting. He  admitted to feeling bloated. The patient stated that he had been compliant  with his antibiotics.   PHYSICAL EXAMINATION:  VITAL SIGNS:  On examination on admission as per  Crista Curb, M.D. revealed a temperature of 100.3 with a blood pressure  OF 135/78, pulse 85, respiratory rate 22, O2  saturation of 96% on room air.  GENERAL:  The patient was noted to be obese, in no acute distress.  ABDOMEN:  Soft. Bowel sounds present. Upper abdominal tenderness with  voluntary guarding noted.  EXTREMITIES:  He had +1 bilateral edema with a skin tear on the anterior  distal left lower extremity with some surrounding edema.  The rest of his examination was noted to be within normal limits.   LABORATORY DATA:  White cell count was noted to be 14,000 and the rest of  his CBC was within normal limits.   An acute abdominal series was done and it was noted to be unremarkable.   HOSPITAL COURSE BY PROBLEM:  PROBLEM 1:  Acute cholecystitis. Upon  admisison, the patient was started on intravenous antibiotics of  ciprofloxacin and Flagyl and a CT abdomen with contrast was ordered. Because  the patient's creatinine was elevated, 1.8, this could not be done  immediately. The patient was closely monitored and it was noted that his  white cell count was trending upward and so the patient's antibiotics were  changed to Zosyn. The patient's symptoms seemed  to improve on Zosyn and also  his leukocytosis resolved. White blood cell count 7.6. A CT scan of the  abdomen was subsequently done and it showed acute cholecystitis. Following  this finding, surgery was consulted and Dr. Zachery Dakins saw the patient. Dr.  Alanda Amass was also consulted for cardiac clearance prior to the surgery and  Dr. Domingo Sep saw the patient. The patient had a laparoscopic cholecystectomy  done on June 07, 2005. He tolerated the procedure well. His aspirin and  Plavix were held for a couple of days after the surgery. The patient is  doing well at this time. He is tolerating p.o. well and has been advanced to  a diabetic diet at this time. He will be discharged home with the drains as  per Dr. Zachery Dakins and he is to followup with him as scheduled.   PROBLEM 2:  Diabetes mellitus. The patient was on Lantus as well as sliding   scale insulin in the hospital. The dose of his Lantus will be specified at  the time of discharge.   PROBLEM 3:  Coronary artery disease, status post stent placements. As  discussed above, cardiology was consulted for cardiac clearance. The patient  has remained chest pain free and hemodynamically stable throughout his  hospital stay.   PROBLEM 4:  Hypertension. The patient's blood pressure was maintained with  Lopressor during his hospital stay.   PROBLEM 5:  Gout. The patient was maintained on Allopurinol as well as  colchicine during his hospital stay. His colchicine dose was decreased to  0.6 mg q.d. while he was in the hospital.   PROBLEM 6:  History of chronic renal insufficiency. As indicated, the  patient's creatinine upon admission was 1.8, it increased to 1.9 but  subsequently with hydration and decreasing the colchicine to once a day, the  creatinine improved. The creatinine at the time of this discharge today is  1.6 with a BUN of 12.   DISCHARGE MEDICATIONS:  1.  Vicodin 5 mg 1 to 2 tablets p.o. q. 4 to 6 hours p.r.n.  2.  Protonix 40 mg 1 p.o. q.d.  3.  Lantus - dose to be specified at the time of discharge.  4.  The patient to continue pre-admission medications except as indicated      above.  5.  Discontinue ciprofloxacin and Flagyl.   DIET:  Diabetic diet.   FOLLOW UP:  1.  Dr. Zachery Dakins as scheduled.  2.  Dr. Manus Gunning in 1 week.  3.  Dr. Alanda Amass as scheduled.   CONDITION ON DISCHARGE:  Improved and stable.       ACV/MEDQ  D:  06/08/2005  T:  06/08/2005  Job:  [id number]   cc:   Bryan Lemma. Manus Gunning, M.D.  [address] [place]  [place] [address]  Fax: [phone]   Anselm Pancoast. Zachery Dakins, M.D.  [address], [address]  [place]  [place] [address]   Gerlene Burdock A. Alanda Amass, M.D.  [phone] [address], [address]  [place]  [place] [address]  Fax: [phone]

## 2011-05-01 NOTE — Op Note (Signed)
NAME:  Randall Wilkins, Jahfari                ACCOUNT NO.:  [id number]   MEDICAL RECORD NO.:  [id number]          PATIENT TYPE:  INP   LOCATION:  2306                         FACILITY:  [facility]   PHYSICIAN:  Coralyn Helling, MD        DATE OF BIRTH:  Dec 18, 1940   DATE OF PROCEDURE:  01/21/2007  DATE OF DISCHARGE:                               OPERATIVE REPORT   SURGEON:  Coralyn Helling, MD.   PROCEDURE:  Bronchoscopy with airway inspection and suctioning of the  secretions.   PREOPERATIVE DIAGNOSIS:  Respiratory failure and pneumonia.   POSTOPERATIVE DIAGNOSIS:  Respiratory failure and pneumonia.   INDICATIONS FOR PROCEDURE:  [name] is a 66yomi, who is currently in  the intensive care unit, intubated from pneumonia.  He has significant  mucous plug with lung collapse.   DESCRIPTION OF PROCEDURE:  The patient is currently on intravenous  fentanyl and Versed infusions for sedation and analgesia.  He was  preoxygenated to 100% FIO2.  The bronchoscope was entered through the  endotracheal tube.  The carina was visualized.  The airways appeared  hyperemic, more so on the left than the right.  There was a large amount  of thick brownish secretions exuding mostly from the left lower lobe  bronchus, but also from the right middle lobe bronchus.  Approximately  50 mL of saline was instilled for bronchial washings.  The patient had a  transient drop in his oxygen level to 92%.  The procedure was  interrupted temporarily.  His oxygenation was improved, and the  bronchoscope was then reentered into the endotracheal tube.  The  remainder or the secretions were retracted.  There were no other  immediate complications.  As the patient had the recent BAL done and he  has been on antibiotics, specimens were not sent for further testing at  this time.      Coralyn Helling, MD  Electronically Signed     VS/MEDQ  D:  01/21/2007  T:  01/21/2007  Job:  [id number]

## 2011-05-01 NOTE — Discharge Summary (Signed)
NAME:  [name], [name]                ACCOUNT NO.:  [id number]   MEDICAL RECORD NO.:  [id number]          PATIENT TYPE:  INP   LOCATION:  1511                         [facility]ACILITY[facility]  [facility]   PHYSICIAN:  Corinna L. Lendell Caprice, MDDATE OF BIRTH:  04/29/1941   DATE OF ADMISSION:  06/02/2005  DATE OF DISCHARGE:  06/09/2005                                 DISCHARGE SUMMARY   ADDENDUM:  The patient is to take Lantus 70 units in the morning and skip  his evening dose until his appetite returns to normal.  He is also being  discharged with a JP drain in place.  His wife has been instructed on the  care and recording output.  She is to bring outpatient information to Dr.  Zachery Dakins. The patient is to followup with Dr. Zachery Dakins on Friday morning  and is to call for an appointment.       CLS/MEDQ  D:  06/09/2005  T:  06/09/2005  Job:  [id number]

## 2011-05-01 NOTE — Op Note (Signed)
[facility]  Patient:    Randall Wilkins, Randall Wilkins                       MRN: [id number] Proc. Date: 06/28/01 Adm. Date:  [id number] Attending:  Jonne Ply                           Operative Report  PREOPERATIVE DIAGNOSIS:  C3-4 spondylosis with myelopathy.  POSTOPERATIVE DIAGNOSIS:  C3-4 spondylosis with myelopathy.  OPERATION PERFORMED:  Anterior cervical diskectomy and arthrodesis, C3-4. Structural allograft, Synthes fixation.  SURGEON:  Stefani Dama, M.D.  ASSISTANT:  Hewitt Shorts, M.D.  ANESTHESIA:  General endotracheal.  INDICATIONS FOR PROCEDURE:  The patient is a 70 year old individual who has had significant neck, shoulder and arm pain.  He has had a severe spondylitic process at the C3-4 level.  This was felt to be asymptomatic but because of his increase in symptoms, he has been advised regarding surgical decompression and stabilization.  DESCRIPTION OF PROCEDURE:  The patient was brought to the operating room and placed on the table in supine position.  After a smooth induction of general endotracheal anesthesia, he was placed in five pounds of halter traction. The neck was shaved, prepped with DuraPrep and draped in sterile fashion. Cervical incision was made in the left upper portion of the neck above his previously made incision for his C5-6 diskectomy.  This was dissected down through the superficial soft tissues and the platysma and then the prevertebral space was reached.  The first identifiable disk space was noted to be that of C3-4 on the radiograph.  Then by carefully dissecting the esophageal pharyngeal border and esophagus off the front of the neck, a self-retaining retractor retractor was placed behind the longus colli muscle on either side.  The anterior longitudinal ligament in this area was noted to be quite thickened.  This was stripped off the bone.  The disk space was entered and diskectomy was  performed.  There was a remarkable amount of severely degenerated disk material with a remarkable posterior protrusion of the disk from the subligamentous space and there was bone spur that was forming off the inferior margin of the body of C3.  This was taken down with a 2.3 mm dissecting tool and the Anspach drill.  Dissection was carried out laterally and the large uncinate processes were removed from the site at C3-4. Once this area was decompressed and hemostasis was achieved and the posterior longitudinal ligament was completely open, the dura was felt to be well decompressed, the area was checked for hemostasis and a 7 mm tricortical graft was placed into the interspace in the inverted position so that the tricortical bone was facing dorsally.  The ventral edges were cleared smoothly and once this was achieved, then an 18 mm plate was affixed to the ventral aspect of the vertebral body with four locking 4 x 14 mm screws.  This area was then copiously irrigated with antibiotic irrigating solution.  The soft tissues were checked for hemostasis and then the platysma muscle was reapproximated and the subcuticular tissues were closed with 3-0 Vicryl in interrupted fashion.  The patient tolerated the procedure well and was returned to the recovery room in stable condition. DD:  06/28/01 TD:  06/28/01 Job: [id number]

## 2011-05-01 NOTE — Discharge Summary (Signed)
NAME:  Randall Wilkins, Randall Wilkins                          ACCOUNT NO.:  [id number]   MEDICAL RECORD NO.:  [id number]                   PATIENT TYPE:  INP   LOCATION:  2910                                 FACILITY:  [facility]   PHYSICIAN:  Richard A. Alanda Amass, M.D.          DATE OF BIRTH:  [birth year]   DATE OF ADMISSION:  04/03/2004  DATE OF DISCHARGE:  04/06/2004                                 DISCHARGE SUMMARY   DISCHARGE DIAGNOSES:  1. Coronary artery disease, status post myocardial infarction remotely and     multiple interventions, status post stenting of the right coronary artery     lesion this admission.  2. Diabetes mellitus insulin dependent.  3. Hyperlipidemia.  4. Hypertension controlled.  5. Known renal insufficiency, chronic problem.  6. Renal insufficiency, improved.  7. Gouty arthritis, treated.  8. Exogenous obesity.  9. Remote smoker.   HISTORY OF PRESENT ILLNESS:  This is a 70 year old Caucasian gentleman  patient of Richard A. Alanda Amass, M.D., who was evaluated in the office for  complaints of chest pain and underwent Cardiolite stress test which revealed  abnormal results.  His angiography was postponed due to the patient'Wilkins  history of renal insufficiency and at that time elevated BUN and creatinine.  His BUN went up 59 and creatinine was 3.1.  Zestril was stopped and dose of  Demadex as an outpatient was reduced from 100 to 20 mg daily and two weeks  later renal function was rechecked prior to catheterization and showed  significant improvement with creatinine being 1.7 and BUN 36.  The patient  was scheduled for coronary angiography and intervention on April 04, 2004,  and was admitted for hydration prior to the procedure on April 03, 2004.  Prior to the procedure, he was placed on Mucomyst dose.   His BUN and creatinine on April 03, 2004, were 14 and 1.2, respectively.   PROCEDURE:  Cardiac catheterization performed by Dr. Alanda Amass on April 04, 2004.  The patient  underwent angioplasty and stenting of the high grade  lesion of proximal RCA with the reduction of the stenosis from 99 to less  than 0%. He tolerated the procedure well and was transferred to the holding  area and later telemetry unit in stable condition.   HOSPITAL COURSE:  Postprocedure BUN and creatinine were 9 and 1.1.  He was  without any complaints of chest pain or shortness of breath.  His groin  revealed just mild ecchymosis, no bleeding, no hematoma.   On the day of discharge, he was assessed by Loney Laurence, M.D. and  deemed to be stable for discharge home.  Telemetry showed sinus rhythm.  His  creatinine was 1.3 the day of discharge, BUN 13, potassium 3.7, hemoglobin  12.6, hematocrit 36.4.   DISCHARGE MEDICATIONS:  1. Plavix 75 mg daily.  2. Aspirin 81 mg daily.  3. Niaspan 1000 mg daily.  4. Lipitor 40 mg daily.  5.  Metoprolol 50 mg b.i.d.  6. Norvasc 5 mg daily.  7. Demadex 20 mg daily.  8. Kay Ciel 20 mEq daily.  9. Insulin home dose.  10.      Colchicine 0.6 mg b.i.d.  11.      Allopurinol 300 mg daily.  12.      Amitriptyline 25 mg q.h.Wilkins. p.r.n.   ACTIVITY:  No driving, no lifting greater than 5 pounds.  No strenuous  activity for 72 hours after discharge.   DIET:  Low cholesterol, low salt diet.   FOLLOW UP:  The patient was instructed to contact Dr. Kandis Cocking office to  be seen in two to three weeks post catheterization.      Raymon Mutton, P.A.                    Richard A. Alanda Amass, M.D.    MK/MEDQ  D:  04/06/2004  T:  04/07/2004  Job:  [id number]

## 2011-05-01 NOTE — Op Note (Signed)
NAME:  Randall Wilkins, Randall Wilkins                ACCOUNT NO.:  [id number]   MEDICAL RECORD NO.:  [id number]          PATIENT TYPE:  INP   LOCATION:  1511                         FACILITY:  [facility]   PHYSICIAN:  Anselm Pancoast. Weatherly, M.D.DATE OF BIRTH:  [birth year]   DATE OF PROCEDURE:  06/07/2005  DATE OF DISCHARGE:                                 OPERATIVE REPORT   PREOPERATIVE DIAGNOSES:  1.  Acute cholecystitis.  2.  Diabetes mellitus.  3.  Coronary artery disease.  4.  Hypertension.   POSTOPERATIVE DIAGNOSES:  1.  Acute cholecystitis.  2.  Diabetes mellitus.  3.  Coronary artery disease.  4.  Hypertension.   PROCEDURE:  Laparoscopic cholecystectomy.   SURGEON:  Anselm Pancoast. Zachery Dakins, M.D.   ASSISTANT:  Sharlet Salina T. Hoxworth, M.D.   ANESTHESIA:  General.   INDICATIONS:  Everest Randall Wilkins is a 70 year old diabetic disabled because of his  medical problems, hypertension, diabetes and coronary artery disease, and  about two weeks ago he started having abdominal pain.  At first it was kind  of more generalized and I think he came to the emergency room and was  diagnosed as having diverticulitis and started on Flagyl and Cipro.  Several  days later followup with his medical doctor and no improvement, and he was  admitted to the hospital service with the presumptive diagnosis of  diverticulitis.  His antibiotics were then given intravenously and after  several days of no real improvement at his first his BUN and creatinine were  elevated.  They had done one CT and then a second CT was performed, and this  showed no evidence of diverticulitis, but it showed an acutely inflamed  gallbladder.  This was yesterday, and I was asked to see the patient.  We  had a very busy work schedule, and I recommend that we do coronary clearance  which was done and then add him to the OR schedule for today.  The patient  is afebrile but mildly tender in the upper abdomen.  He is on fairly high  dose insulin for  management of his diabetes.  This morning he was held NPO,  switched over to a Glucomander and the procedure explained to the patient.  He was in agreement.  He is on aspirin and Plavix which will take a week to  get out of his system.  He has a markedly inflamed gallbladder, so I think  we have no choice but to proceed on promptly.  Fortunately, his liver  function studies are all normal.   DESCRIPTION OF PROCEDURE:  The patient was taken to the operative suite.  He  got PAS stockings positioned on the operating room table.  Induction of  general anesthesia by Dr. Rica Mast, and then the abdomen was prepped with  Betadine surgical scrub and solution.  He is a large male with very  protuberant abdomen and after draping sterilely, a small incision was made  below  the umbilicus.  The fascia was identified, picked up between two  Kochers and a small opening made.  The underlying peritoneum was identified  and then  we carefully opened it to the peritoneal cavity.  A pursestring  suture of 0 Vicryl was placed, and a Hasson cannula was  introduced.  The  camera was inserted and you could see the omentum just plastered down and a  markedly inflamed gallbladder but fortunately no evidence of any generalized  peritonitis.  The upper 10 mm trocar was placed and then the two lateral 5  mm trocars were placed in the appropriate position.  Very quickly we  switched on over to a 30 degree scope since you could not see anything in  the right upper quadrant because of his size, the angle, etc.  We also then  used an additional 5 mm port and squiggly to push down on the transverse  colon and the omentum so we could see the more proximal gallbladder.  First,  we aspirated this acutely inflamed gallbladder with the __________ aspirator  and then it allowed [place] to be able to grasp it.  The proximal portion of the  gallbladder we kind of teased down the surrounding inflammatory.  Some were  actually divided with  hot scissors and the others kind of bluntly dissected.  The proximal portion of the gallbladder we think there was a big stone  impacted in the neck and this was kind of teased around it and the  peritoneum kind of opened.  As far as the actual cystic duct we never really  saw something that we could definitely identify via cholangiogram. There was  a little area that we felt that was a cystic artery that was doubly clipped  proximally, and then this was divided and then working down around the area  where the cystic duct should have been all of the little strictures were  clipped before being divided but we did not identify anything obviously a  cystic duct so a cholangiogram could be obtained.  We had x-ray available  but after we basically separated this markedly inflamed gallbladder from the  cystic duct, cystic artery area.  We released the C-arm people.  Again, the  gallbladder was kind of teased from its bed using the hook electrocautery  with spatula.  The gallbladder was placed in an EndoCatch bag.  There was a  little bleeding from the liver bed.  It was cauterized and then some  Surgicel placed over the gallbladder fossa.  We did have good hemostasis.  We did place an 66 Blake drain via the lateral port in the gallbladder fossa  since we were sure we were definitely good clip on the cystic duct.  He is  also having enough kind of oozing from the Plavix and aspirin that he ought  to be drained also.  The gallbladder was in the EndoCatch bag and brought  out the umbilicus.  We inserted the camera in the umbilical port.  We  reinspected.  I was satisfied with the hemostasis and drain placement.  Then  the camera was again switched to the upper 10 mm port.  The squiggly and the  umbilical retractor were removed.  We used two figure-of-eight sutures in  addition to the pursestring at the umbilicus and anesthetized the fascia at the umbilicus.  The other 5 mm port had been withdrawn and  then the carbon  dioxide released and the upper 10 mm trocar withdrawn.  The subcutaneous  wounds were closed with 4-0 Vicryl and Benzoin and Steri-Strips on the skin.  I would keep him on the antibiotics for another 24 hours  and would go  lightly duty with his diet and __________ today, and his Regular insulin  doses will be left up to the medical doctors.  I would definitely like to  keep him definitely at least another 24 hours and will discharge him with  the drain in place and will remove it in the office at the later part of the  week if he is not having any bilious drainage from the JP.  The patient  tolerated the procedure well.  Estimated blood loss is probably 150 mL which  is more than usual but at no time were there no obvious arterial type  bleeding.       WJW/MEDQ  D:  06/07/2005  T:  06/07/2005  Job:  [id number]   cc:   Donia Guiles, M.D.  [address] [place]  [place] [address]  Fax: [phone]

## 2011-05-01 NOTE — Consult Note (Signed)
NAME:  Randall Wilkins, Develle                ACCOUNT NO.:  [id number]   MEDICAL RECORD NO.:  [id number]          PATIENT TYPE:  INP   LOCATION:  2306                         FACILITY:  [facility]   PHYSICIAN:  Coralyn Helling, MD        DATE OF BIRTH:  1941/11/26   DATE OF CONSULTATION:  01/17/2007  DATE OF DISCHARGE:                                 CONSULTATION   REASON FOR CONSULTATION:  Acute respiratory failure.   HISTORY OF PRESENT ILLNESS:  [name] is a 70 year old male who was  admitted on February 3 with acute onset of shortness of breath.  He says  that this actually was progressing over the last few days with symptoms  of nasal congestion, postnasal drip, cough and increasing shortness of  breath which got significantly worse on February 3.  The paramedics were  called and initial vital signs showed that he had a blood pressure  152/104, respirations 26, heart rate 100 and oxygen saturation 65%.  He  was subsequently started on supplemental oxygen and then noninvasive  positive pressure ventilation.  He is also given Lasix and nitroglycerin  as well as being started on Rocephin and Zithromax.  His initial chest x-  ray showed pulmonary vascular congestion and possible infiltrate on  right.  Since that time, he says that his symptoms have improved to some  degree, although he still requires a significant amount of supplemental  oxygen as well as noninvasive positive pressure ventilation.  He denied  having any fevers.  He denies having any chest pains also.   PAST MEDICAL HISTORY:  1. Coronary disease.  2. Myocardial infarction status post stenting diabetes.  3. Gout.  4. Acute renal insufficiency secondary to ACE inhibitor and NSAID use.  5. Arthritis  6. Hypertension.  7. Obesity.  8. Cervical spine surgery.  9. Diabetic neuropathy.  10.Cholecystectomy.  11.Sleep apnea for which he uses a CPAP machine at home.   OUTPATIENT MEDICATIONS:  1. Lopressor.  2. Aspirin.  3.  Allopurinol.  4. Demadex.  5. Crestor.  6. Humalog.  7. Lantus.  8. Plavix.  9. Niaspan.  10.Norvasc.  11.Morphine.   ALLERGIES:  He has no known drug allergies.   SOCIAL HISTORY:  He quit smoking approximately 30 years ago.  He is  currently on disability.  He is married.   FAMILY HISTORY:  Noncontributory.   REVIEW OF SYSTEMS:  Negative except as stated above.   PHYSICAL EXAMINATION:  VITAL SIGNS:  T-max is 39.1, temperature is  currently 37.4, blood pressure is 118/47, heart rate 70, oxygen  saturation 97% on BiPAP of 16/8 with a 90% FIO2, respirations 24.  HEENT:  Pupils reactive.  There was no sinus tenderness.  No oral  lesions.  No lymphadenopathy.  HEART:  S1-S2.  CHEST:  He has bilateral crackles.  ABDOMEN:  Obese, soft, nontender.  EXTREMITIES:  He has 1+ edema, but per the patient, has decreased since  the previous day.  NEUROLOGIC:  He was awake and alert, able to follow commands and move  all four extremities.   STUDIES:  Chest x-ray shows pulmonary vascular congestion with a  possible infiltrate on the right.  PH of 7.30, pCO2 is 47.1, pO2 is 52.  WBC is 14.4, hemoglobin is 13%, hematocrit 41, platelet count is 181.  Sodium is 138, potassium is 5, chloride is 104, CO2 is 24, BUN is 40,  creatinine is 2.1, glucose 329.  Troponin went from 0.06 to 3.3 4 to  3.09.  BNP went from 752 to 1307.  Blood cultures and urine cultures are  pending.   IMPRESSION:  1. Acute respiratory failure secondary to pulmonary edema and possibly      pneumonia.  Agree with the choice of antibiotics.  I would continue      him on noninvasive positive pressure ventilation and titrate oxygen      to keep his oxygen saturation greater than 90%.  I would follow up      on his chest x-ray and arterial blood gas.  If he were to      deteriorate, then he may need intubation, mechanical ventilation.      He currently being diuresed and appears to have improved      symptomatically  somewhat in responses.  2. Coronary disease with an elevation in his troponin.  He is being      followed by Cardiology for this.  3. Diabetes mellitus.  He has been started on hyperglycemia protocol.  4. Acute on chronic renal insufficiency.  A nephrology consult has      been requested.      Coralyn Helling, MD  Electronically Signed     VS/MEDQ  D:  01/17/2007  T:  01/17/2007  Job:  [id number]

## 2011-05-01 NOTE — Consult Note (Signed)
NAME:  Randall Wilkins, Randall Wilkins                          ACCOUNT NO.:  [id number]   MEDICAL RECORD NO.:  [id number]                   PATIENT TYPE:  REC   LOCATION:  FOOT                                 FACILITY:  [facility]   PHYSICIAN:  Jonelle Sports. Sevier, M.D.              DATE OF BIRTH:  [birth year]   DATE OF CONSULTATION:  10/05/2002  DATE OF DISCHARGE:                                   CONSULTATION   HISTORY OF PRESENT ILLNESS:  This 70 year old, white male with type 2  diabetes diagnosed some six years ago, is seen for evaluation of foot and  lower extremity care.  The patient is quite obese and has been for some time  and clinically would appear likely to represent metabolic syndrome.  His  diabetes has apparently not been well-controlled despite combination of  insulin oral agents with recent hemoglobin A1C of 9.8%.  he has a history of  coronary artery disease with prior stents.  He has also had chronic venous  insufficiency in the lower extremities and has had numerous ulcers in the  past, but none are active at the present time.  He religiously wears  compression hose and is very happy with these.   The patient has had no ulcerations or foot ulcers, but is sent today for our  advice from a preventive perspective.   PAST MEDICAL HISTORY:  1. Hypertension.  2. Hyperlipidemia.  3. Kidney stones.  4. Coronary artery disease.  5. Diabetes.   ALLERGIES:  No known drug allergies.   MEDICATIONS:  1. Metoprolol.  2. Prinivil.  3. Furosemide.  4. Norvasc.  5. Zaroxolyn.  6. K-Dur.  7. Enteric coated aspirin.  8. Lipitor.  9. Plavix.  10.      Lantus.  11.      Niaspan.  12.      Colchicine.  13.      Glucotrol XL.  14.      Demodex.  15.      70/30 insulin.  16.      Imdur.  17.      Amitriptyline.  18.      Calcium.  19.      Magnesium.  20.      Zinc.   PHYSICAL EXAMINATION:  EXTREMITIES:  The patient has chronic, low-grade  edema of both lower extremities below the knees  and chronic stasis  dermatitis changes.  He has no open ulcerations at this time.  Pulses in his  feet are palpable, but diminished and the toes showed poor capillary  filling.  Skin temperatures are essentially normal and symmetrical.  Monofilament testing shows that he lacks protective sensation on the toes  and metatarsal head areas bilaterally.  He has no significant callous  formation, no open ulcerations.   RECOMMENDATIONS:  1. The patient is given general instructions regarding foot care and     diabetes via video with nurse  and physician reinforcement.  2. The patient is advised that diabetes control is extremely important to     slow the development of his neuropathy and to protect his feet from     future problems.  He is encouraged to make every effort to improve that     control and to work with Dr. Juleen China.  3. It is discussed with the patient since he has gotten to the point with     loss of protective sensation in his feet that we place him in custom     diabetic inserts and he is amenable to this.  Accordingly, he is given     the appropriate prescription and referred to [facility] to obtain these     orthotics.  4. He is advised to take several pairs of shoes at that time so they can     check them for the adequacy and for the fit of the inserts.  5. The patient is given nail care by the nurse.  6. Return visit here will be on a p.r.n. basis, likely simply q.3 months for     nail care.                                               Jonelle Sports. Cheryll Cockayne, M.D.    RES/MEDQ  D:  10/05/2002  T:  10/05/2002  Job:  [id number]   cc:   Brooke Bonito, M.D.  [address]  [place]  [place] [address]  Fax: [phone]
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
## 2011-05-01 NOTE — H&P (Signed)
[place]. [facility]  Patient:    Randall Wilkins, Randall Wilkins                         MRN: [id number] Adm. Date:  06/28/01 Attending:  Stefani Dama, M.D.                         History and Physical  ADMITTING DIAGNOSIS:  Cervical spondylosis with myelopathy, C3-4.  HISTORY OF PRESENT ILLNESS:  [name] is a 70 year old individual who has had problems with neck, shoulder, and left arm pain.  His whole hand will tend to tingle and go numb.  The onset was rather insidious for the past several month.  The patient saw Dr. Gaylan Gerold who suggested further follow-up. The patient notes that he cannot get comfortable in any given position in bed. He feels that the arm has been weak, overall, but does not notice any particularly position that will tend to make the pain worse or alleviate the pain.  He notes that flexing his neck forward will tend to aggravate his pain and extending his neck backwards will also tend to aggravate the pain. Coughing and sneezing do not seem to affect it.  He denies anything that sounds like a Lhermitte phenomenon.  PAST MEDICAL HISTORY:  Reveals that he has had significant spine problems in the past, and, in fact, in January underwent the posterior foraminotomy for a C7 radiculopathy on the left side.  This was relieved post-surgically and the patient states that the current pain and problem seems different than what he had had previously.  The patient has had coronary artery disease in the past, and had other previous disk surgery in 1994.  He has had lumbar disk disease in 1992, with surgery done on his back at that time.  Cardiac catheterization was last performed in 1997.  He has had a heart attack in the past.  He has had lumbar disk surgery in 1970, 1976, 1978, and 1984, aside from the surgeries noted above.  ALLERGIES:  He has no allergies to any medications.  SOCIAL HISTORY:  He does not smoke and does not drink alcohol.  CURRENT  MEDICATIONS:  1. Metoprolol.  2. Zestril.  3. Norvasc.  4. Lipitor.  5. Coated aspirin daily.  6. Lasix.  7. K-Dur.  8. Plavix.  9. Niaspan. 10. Zaroxolyn. 11. Glucovance. 12. Lantus insulin. 13. Celebrex.  REVIEW OF SYSTEMS:  Notable for leg pain on walking.  There is arm pain, neck pain, in addition to history of diabetes on a 14-point review sheet discussed with the patient.  FAMILY HISTORY:  Negative for any significant medical problems, such as diabetes, high blood pressure, liver, heart, or kidney disease.  PHYSICAL EXAMINATION:  VITAL SIGNS:  Reveals that his blood pressure is 160/92, heart rate 72, respirations 16.  HEENT/NEURO:  No masses are palpable in the neck and no bruits are heard. Range of motion of his neck reveals poor turning only 30 degrees to the left and 30 degrees to the right, extension to only 15 degrees, and flexion of 20 degrees, which aggravates pain.  Axial compression also aggravates pain. Motor strength in the upper extremities reveals deltoid, biceps, triceps, grips, and intrinsics all have 4/5 strength on the left compared to the right. Sensation is intact to pin and vibration in the distal upper extremities. Station and gait were normal.  Lower extremity strength and reflexes are normal, as  an cranial nerve examination reveals the pupils are 4 mm and briskly reactive to light and accommodation.  The extraocular movements are full.  Face is symmetric to grimace.  Tongue and uvula are in the midline. Sclerae and conjunctivae are clear.  Fundi reveal the disks to be flat.  NECK:  The neck is thick, no masses are palpable, no bruits are heard.  LUNGS:  Clear to auscultation.  HEART:  Regular rate and rhythm.  ABDOMEN:  Soft, protuberant.  Bowel sounds are positive.  No masses are palpable.  EXTREMITIES:  Reveal no clubbing, cyanosis, or edema.  IMPRESSION:  The patient is a 70 year old individual with significant diabetes and  significant previous spondylitic disease.  He now has a herniated disk at the C3-4 level which has been noted previously, but was felt to be asymptomatic.  He is being admitted to undergo surgical decompression and stabilization at the C3-4 level. DD:  06/28/01 TD:  06/28/01 Job: [id number]

## 2011-05-01 NOTE — Discharge Summary (Signed)
NAME:  Randall Wilkins, Randall Wilkins                ACCOUNT NO.:  [id number]   MEDICAL RECORD NO.:  [id number]          PATIENT TYPE:  INP   LOCATION:  4706                         FACILITY:  [facility]   PHYSICIAN:  Isidor Holts, M.D.  DATE OF BIRTH:  [birth year]   DATE OF ADMISSION:  01/16/2007  DATE OF DISCHARGE:                         DISCHARGE SUMMARY - REFERRING   DISCHARGE DIAGNOSES:  1. Ventilator dependent respiratory failure, secondary to congestive      heart failure/volume overload, pneumonia, acute pericarditis.      Extubated January 27, 2007.  2. Paroxysmal atrial fibrillation.  3. Rhabdomyolysis.  4. Acute on chronic renal insufficiency secondary to rhabdomyolysis.  5. Pneumonia.  6. Hypernatremia.  7. Antibiotic associated diarrhea, Clostridium difficile toxin      negative.  8. Type 2 diabetes mellitus with peripheral neuropathy.  9. Gout.  10.History of coronary artery disease status post previous myocardial      infarction.  11.Hypertension.  12.Obstructive sleep apnea syndrome on home CPAP.  13.Chronic obstructive pulmonary disease.  14.History of previous cervical spine surgery.  15.Moderate protein calorie malnutrition.  16.Oropharyngeal dysphagia.  17.Deconditioning.   DISCHARGE MEDICATIONS:  1. Lovenox 130 mg SQ q.12 hours until INR is therapeutic x48 hours.  2. Coumadin per INR, currently on 15 mg p.o. daily.  3. Lopressor 25 mg p.o. t.i.d.  4. Protonix 40 mg p.o. daily.  5. Gabapentin 600 mg p.o. t.i.d.  6. Lasix 40 mg p.o. b.i.d.  7. Lantus insulin 30 units SQ q.12 hours.  8. K-Dur 40 mEq p.o. daily.  9. Lisinopril 10 mg p.o. daily.  10.Dysphagia-3 diet with thin liquids, until reevaluated by speech      pathologist.   PROCEDURES:  1. Chest x-ray dated January 16, 2007 showed interval extensive      bilateral pneumonia or alveolar edema, greater on the right.  2. Portable chest x-ray dated January 17, 2007 showed improving      pulmonary edematous changes;  perihilar air space disease.  3. Portable chest x-ray dated January 18, 2007 showed persistent bi-      hilar upper lobe air space disease, also improved aeration of the      left base.  4. Chest x-ray dated January 19, 2007 showed improving pulmonary edema      particularly in the right upper lobe.  5. Chest x-ray dated January 20, 2007 showed satisfactory support.      Scant improvement in aeration of the left lung with persistent      dense lower lobe atelectasis; mild right basilar atelectasis is      unchanged.  6. Chest CT scan dated January 20, 2007 showed opacified left      hemithorax primarily due to collapsed lung; a small left pleural      effusion is also evident and small right pleural effusion with new      collapse of right lower lobe; attenuation of right middle and upper      lobe likely due to presence of additional  areas of atelectasis;      cardiomegaly; coronary artery disease.  7. Chest x-ray dated January 21, 2007 showed worsening pulmonary      edema; left lower lobe collapse has progressed.  8. Chest x-ray dated January 22, 2007 showed increased perihilar      edema; persistent dense consolidation in the left lower lobe.  9. Chest x-ray dated January 23, 2007 showed persistent edema;      bibasilar atelectasis.  10.Abdominal x-ray dated February 10/ 2008 showed Panda tube in the      distal stomach.  11.Portable chest x-ray dated January 24, 2007 showed increased right      effusion and basilar air space disease with persistent pulmonary      edema.  12.Chest x-ray dated January 25, 2007 showed decreased right      effusion, improved edema and left air space disease.  13.Abdominal x-ray dated January 25, 2007 showed Panda tube in the      first portion of the duodenum in good position.  14.Chest x-ray dated January 25, 2007 showed right-sided PICC line      just above the superior caval/atrial junction, all the support      aparatus appropriately  positoned; slight improvement in pulmonary      edema with similar air space disease and right-sided pleural      effusion.  15.Chest x-ray dated January 27, 2007 showed slight improvement in      pulmonary edema, right effusion.  16.Chest x-ray dated January 29, 2007 showed no significant change in      bilateral air space disease, most likely due to pulmonary edema.  17.Abdominal x-ray dated January 31, 2007 - no tube identified from      T7 or lower.  18.Two dimensional echocardiogram dated January 17, 2007 showed left      ventricle size upper limits of normal. Left ventricular systolic      function was normal overall. Ejection fraction 50-60%. No      diagnostic evidence of regional wall motion abnormalities. Doppler      parameters are consistent with mean elevated left atrial filling      pressure. There was systolic mitral bowing of anterior leaflet but      without diagnostic evidence of mitral valve prolapse. Left atrial      size upper limits of normal. Left ventricular size upper limits of      normal. Possible trivial pericardial effusion at the apex versus      apical fat pad.   CONSULTATIONS:  Pulmonary/critical care team, Dr. Coralyn Helling.   ADMISSION HISTORY:  As in consultation notes of January 17, 2007  dictated by Dr. Coralyn Helling. However in brief, this is a 70 year old  male, with known history of coronary artery disease status post  myocardial infarction requiring PCI, type 2 diabetes mellitus, gout,  renal insufficiency, arthritis, hypertension, obesity, obstructive sleep  apnea syndrome on home CPAP, status post previous cervical spine  surgery, diabetic neuropathy, who presented on January 16, 2007 with  acute shortness of breath. A chest x-ray showed pulmonary edema and  possible pneumonic infiltrate. He was managed with intravenous Lasix and  was started on Rocephin and Azithromycin, also required noninvasive positive pressure ventilation with BiPAP. He  was subsequently  transferred to the pulmonary service and because of respiratory failure  deemed secondary to a combination of CHF, pneumonia, volume overload and  pericarditis, was intubated.  His clinical course was complicated by  rhabdomyolysis, volume overload, pulmonary edema, hypernatriemia, acute  renal failure on chronic renal insufficiency, as well as diarrhea.  He  was  subsequently successfully extubated on January 27, 2007 and  transferred to the medical service of  [facility] on January 29, 2007.   HOSPITAL COURSE:  PROBLEM #1: Ventilator dependent respiratory failure.  For details of presentation, refer to admission history above. As noted,  patient was extubated successfully on January 27, 2007. In the  subsequent few days, i.e., from January 29, 2007 until the date of this  dictation on February 01, 2007, patient showed no further clinical  evidence of congestive heart failure. He completed his course of Zosyn  for presumed pneumonia on January 29, 2007 and thereafter has shown no  symptoms referable to respiratory tract. He was subsequently switched to  oral Lasix on February 01, 2007 and has been commenced on an ACE  inhibitor.   PROBLEM #2: Paroxysmal atrial fibrillation. The patient reverted to  sinus rhythm, and has remained so ever since. He continues on  anticoagulation with a combination of scheduled Lovenox and Coumadin. As  of the date of this dictation, INR was still subtherapeutic at 1.2. It  is anticipated that he will continue therapeutic Lovenox until 48 hour  overlap with therapeutic INR, is achieved.   PROBLEM #3: Acute renal insufficiency. At the time of presentation, BUN  was 40 with a creatinine of 2.1. This is deemed secondary to  rhabdomyolysis, against a background of preexistent renal insufficiency.  Over the course of his hospitalization creatinine kinase has dropped  from 2865 to 673 on January 31, 2007. The patient's renal  function has  also significantly improved and as of February 01, 2007 his BUN was 28  with a creatinine of 1.15.  I suspect that this is close to patient's  baseline.   PROBLEM #4: Type 2 diabetes mellitus. This has been controlled with a  combination of scheduled Lantus insulin and sliding scale insulin  coverage.   PROBLEM #5: Hypernatremia. This was addressed with hypotonic fluids, and  as of February 01, 2007, he had a sodium level of 143.   PROBLEM #6. Obstructive sleep apnea syndrome. Once patient was extubated  he was commenced on nocturnal CPAP which he has tolerated quite well,  with no problems whatsoever.   PROBLEM #7: Hypertension. This is managed with a combination of beta  blocker and ACE inhibitor treatment as well as diuretics.   PROBLEM #8: Dysphagia. Following extubation, patient underwent speech  pathology evaluation, on January 26, 2007. Unfortunately he failed the FEES at that time, due to severe oropharyngeal dysphagia and was  recommended Panda tube feeds. Repeat evaluation on February 01, 2007  showed markedly improved swallowing without symptoms of overt  aspiration. However he is deemed still at risk for aspiration of liquids  of any consistency, due to residue in the upper esophageal sphincter. He  has been recommended a D-3 diet with thin liquids and strict aspiration  precautions, and Panda tube feeds have ben discontinued. E-Stim has been  recommended, during the course of rehabilitation.   PROBLEM #9. Diarrhea. Patient had diarrheal stools while on antibiotics,  and as tests for C. Difficle toxins, were negative, it was assumed that  this was likely to be antibiotic associated. Predictably, after  completion of antibiotic course, and a brief treatment with probiotics  and Questran, diarrhea quickly resolved.   DISPOSITION:  The patient was considered sufficiently recovered and  clinically stable to be discharged to rehabilitation on February 01, 2007. He has been evaluated by rehabilitation team and will be  transferred to rehabilitation unit,  as soon as feasible.      Isidor Holts, M.D.  Electronically Signed     CO/MEDQ  D:  02/01/2007  T:  02/01/2007  Job:  [id number]

## 2011-05-01 NOTE — H&P (Signed)
NAME:  Randall Wilkins, Randall Wilkins                ACCOUNT NO.:  [id number]   MEDICAL RECORD NO.:  [id number]          PATIENT TYPE:  EMS   LOCATION:  ED                           FACILITY:  [facility]   PHYSICIAN:  Corinna L. Lendell Caprice, MDDATE OF BIRTH:  05-Jan-1941   DATE OF ADMISSION:  06/02/2005  DATE OF DISCHARGE:                                HISTORY & PHYSICAL   CHIEF COMPLAINT:  Abdominal pain.   HISTORY OF PRESENT ILLNESS:  [name] is a 70 year old white male with  multiple medical problems who presents to the emergency room on the advice  of Dr. Arvilla Market, after worsening abdominal pain.  He was diagnosed with  diverticulitis and sent home from the ER with ciprofloxacin and Flagyl about  four days ago.  He had worsening abdominal pain starting this morning.  It  goes across his upper abdomen.  He has had no diarrhea.  No hematochezia.  His appetite has been poor.  He has had no vomiting.  He feels bloated.  He  has not eaten much today.  He has been compliant with his antibiotics.  He  has received Compazine and Dilaudid here in the emergency room.   PAST MEDICAL HISTORY:  1.  Diabetes.  2.  Coronary artery disease.  3.  History of MI with stent placement.  4.  Hypertension.  5.  Gout.  6.  History of renal insufficiency, worsened by ACE inhibitors, nonsteroidal      anti-inflammatories.  7.  He reports a history of congestive heart failure.  His ejection fraction      on catheterization, in April 2005, shows a preserved ejection fraction.   MEDICATIONS:  1.  Lantus insulin 74 units subcutaneously q.h.s.  2.  KCl 40 mEq p.o. b.i.d.  3.  Crestor 20 mg p.o. every day.  4.  Aspirin 81 mg p.o. every day.  5.  Plavix 75 mg p.o. every day.  6.  Demadex 50 mg p.o. every day.  7.  Metoprolol 100 mg p.o. b.i.d.  8.  Norvasc 5 mg p.o. every day.  9.  Allopurinol 600 mg p.o. every day.  10. Humalog 20 units subcutaneously q.a.c.  11. Niaspan 1,000 mg p.o. every day.  12. Folic acid 1 mg p.o.  every day.  13. Nortriptyline 50 mg p.o. every day.  14. Cipro and Flagyl, starting four days ago.  15. Colchicine 0.6 mg p.o. b.i.d.   SOCIAL HISTORY:  The patient is married.  He is disabled.  He does not  smoke, drink, or use drugs.   FAMILY HISTORY:  Noncontributory.   REVIEW OF SYSTEMS:  As above, otherwise negative.   PHYSICAL EXAMINATION:  VITAL SIGNS:  His temperature is 100.3, blood  pressure 135/78, pulse 85, respiratory rate 22, oxygen saturation 96% on  room air.  GENERAL:  The patient is an obese white male in no acute distress.  HEENT:  Normocephalic, atraumatic.  Pupils are equal, round, and reactive to  light.  Sclerae are nonicteric.  Moist mucous membranes.  NECK:  Supple.  No carotid bruits.  No JVD.  LUNGS:  Clear  to auscultation bilaterally without wheezes, rhonchi, or  rales.  CARDIOVASCULAR:  Regular rate and rhythm without murmurs, gallops, rubs.  ABDOMEN:  Normal bowel sounds.  Soft.  He has some upper abdominal  tenderness with voluntary guarding.  GU:  Deferred.  RECTAL:  Deferred.  EXTREMITIES:  No clubbing or cyanosis.  He has 1+ bilateral edema and a skin  tear on his anterior distal left lower extremity with some surrounding  erythema.  Pulses are intact.  SKIN:  No rash.  PSYCHIATRIC:  Normal affect.  NEUROLOGIC:  Alert and oriented.  Cranial nerves and sensory motor exam are  intact.   LABS:  White blood cell count is 14,000, the rest of the CBC is normal.  Acute abdominal series is unremarkable.   ASSESSMENT/PLAN:  1.  Abdominal pain/diverticulitis, failed outpatient therapy.  I will repeat      the CT scan, give intravenous antibiotics, pain control, antiemetics as      needed.  2.  Diabetes.  I will continue Lantus and decrease the Humalog to 10 units      q.a.c.  Start on clear liquids.  His Humalog may need to be increased      further back up to his home dose.  3.  Hypertension.  Continue outpatient medications.  4.  Gout.  5.   Coronary artery disease/stent placement.  Continue his outpatient      medications.  6.  Peripheral edema.  I will hold his Demadex for now.  In addition to      above, I will check a C-MET, amylase, and lipase.       CLS/MEDQ  D:  06/03/2005  T:  06/03/2005  Job:  [id number]   cc:   Bryan Lemma. Manus Gunning, M.D.  [address] [place]  [place] [address]  Fax: [phone]

## 2011-05-01 NOTE — Assessment & Plan Note (Signed)
INTERVAL HISTORY:  Randall Wilkins is here in followup of his rehab stay for  deconditioning and VDRF.  He had a significant problem with his left  foot with weakness and foot drop.  He had not had this prior to his  hospitalization.  He has been fitted with an off-the-shelf AFO and seems  to be fairly happy although still has some problems with the quality of  his gait.  He is still involved at outpatient therapies and doing well  with them.  He denies any pain today.  He has been sleeping well.  His  appetite has been good.  His mood has been improved.  He uses a walker  for gait in and outside the house.  He can walk about 20 minutes without  having to stop.   REVIEW OF SYSTEMS:  Notable for some ongoing numbness of the left leg.  Otherwise, he is stable.  Full review is in the written health and  history section.   SOCIAL HISTORY:  The patient is married.  Family remains supportive.  Wife works days.   PHYSICAL EXAMINATION:  Blood pressure is 178/71, pulse 67, respiratory  rate 16, saturating 100% in room air.  The patient is pleasant, no acute  distress.  He is alert and oriented x3.  Affect is bright and  appropriate.  He walks with shuffling gait on the left foot, has some  difficulty with clearance due to the weakness and the brace.  He is able  to throw the foot out per se using the left knee with some steppage  observed but this was minimal and patient is fairly functional.  He  continues to have some weakness with ankle dorsiflexion which is 0/5.  Plantar flexion is trace out of 5.  He has decreased sensation in both  the peroneal and tibial nerve distribution.  Femoral nerve sensation is  intact.  He has strength at the knee and the hip with adduction and  extension.  Heart was regular.  Chest was clear.  Abdomen soft,  nontender.   ASSESSMENT:  1. History of deconditioning.  2. Rhabdomyolysis which is resolved.  3. Left foot drop likely due to sciatic nerve injury which is  somewhat      improved.  The patient seems to be functioning much better with off-      the-shelf ankle-foot orthosis.   PLAN:  1. I have refilled the patient's Coumadin which he will continue on      for now per Dr. Kandis Cocking advice.  2. Continue with off-the-shelf AFO.  Patient may benefit from custom      device depending on if he improves over the next few months' time.      If we are looking at a more chronic neuropathy at this point may      benefit more from a custom brace.  3. We discussed nerve conduction studies which really would not change      the clinical plan at this point.  May consider at a later date.      Judging by the look of his foot today, I would say that he likely      will have long-term weakness at the left foot.  4. See the patient back in about 2 months' time.  Did recommend that      he follow up with his PCP or cardiologist for his blood pressure      which remains elevated at 178/71 today despite adding a beta  blocker.      [name], M.D.  Electronically Signed     ZTS/MedQ  D:  03/28/2007 13:45:22  T:  03/28/2007 17:29:34  Job #:  [id number]   cc:   Bryan Lemma. Manus Gunning, M.D.  Fax: [phone]   Richard A. Alanda Amass, M.D.  Fax: [phone]

## 2011-05-01 NOTE — Cardiovascular Report (Signed)
NAME:  Randall Wilkins, Randall Wilkins                          ACCOUNT NO.:  [id number]   MEDICAL RECORD NO.:  [id number]                   PATIENT TYPE:  INP   LOCATION:  2910                                 FACILITY:  [facility]   PHYSICIAN:  Richard A. Alanda Amass, M.D.          DATE OF BIRTH:  08-Sep-1941   DATE OF PROCEDURE:  04/04/2004  DATE OF DISCHARGE:                              CARDIAC CATHETERIZATION   PROCEDURES PERFORMED:  1. Retrograde central aortic catheterization.  2. Selective right coronary angiography pre- and post intracoronary     nitroglycerin administration.  3. Predilatation, high-grade disrupted proximal thrombotic right coronary     artery stenosis.  4. AngioJet thrombectomy, right coronary artery.  5. Temporary transvenous pacemaker insertion.  6. Continued Aggrastat infusion, aspirin and an additional 300 milligrams of     Plavix in the laboratory.  7. Large vessel chromium cobalt Guidant proximal right coronary artery     stenting and post dilatation.  8. Cutting balloon atherectomy, high-grade mid posterior descending artery     stenosis.   CARDIOLOGIST:  Richard A. Alanda Amass, M.D.   BRIEF HISTORY:  Please refer to admission H&P and complete cath note of  April 03, 2004 for details.   This 70 year old married grandfather and remote smoker has multiple medical  problems with known coronary disease since suffering a DMI in 20.  He  underwent emergency PTCA at that time and subsequently was treated  medically.  The patient has hyperlipidemia and in the interim he developed  IDDM.  Because of recurrent symptoms he underwent optional diagonal and  large twin diagonal stenting in 1997.  At last follow up study in October  2000 he had no restenosis of his diagonal, optional diagonal, old distal  RCA, or dominant RCA PTCA site; and, had 50% proximal RCA stenosis with  normal LV function and, this was treated medically.  He recently has had  increasing angina compatible with  an accelerated pattern over the last four  to six weeks along with a positive Cardiolite with inferior ischemia.  Catheterization was planned for March 11, 2004; however, because of elevated  BUN and creatinine with creatinine up to 3.1 and BUN 59, this was postponed.  It was found at that time that the patient had been on nonsteroidals with  Indocin because of recent gout.  He was also on ACE inhibitors and high-dose  Demadex for venous edema.  The ACE inhibitors and NSAIDs were discontinued.  Diuretics were decreased and he was  hydrated as an outpatient, and more  intensively treated for his angina, which remained stable angina.  His  creatinine subsequently normalized and on April 02, 2004 was 14/1.2.  He  underwent catheterization as an outpatient April 03, 2004.  This  demonstrated high-grade, disrupted, segmental 95-99% RCA stenosis with TIMI  II to II.5 flow.  The patient did have collaterals to the distal right from  the left coronary system, which were  non-jeopardized and the did not have  any restenosis of his optional diagonal or large twin diagonal stents with  noncritical residual left coronary disease and near normal LV function.   The patient was admitted to [facility].  He was continued on aspirin and  Plavix.  Intravenous heparin was begun, and IIb, IIIa inhibitor with  Aggrastat bolus plus infusion was continued.  He was continued on Mucomyst,  which he had had prior to his catheterization as an outpatient and on the  morning of the procedure was started on a bicarbonate drip as prophylaxis  for PCI.  His creatinine on the day of the procedure  was 1.2.   DESCRIPTION OF PROCEDURE:  The patient was brought to the second floor CP  lab in the post absorptive state.  Heparin was held, Aggrastat was  continued, and he received 300 mg additional Plavix in the lab p.o. and 300  mg the day before; and,  he was continued on aspirin.  He was in stable  condition.   The  common right femoral artery was entered with a single anterior puncture  using an 18 thin-walled needle and the right femoral vein was also entered.  Six French sidearm sheaths were inserted without difficulty using standard  modified Seldinger technique.  Temporary transvenous pacing was established  in anticipation of AngioJet thrombectomy with the pacer in back up in the  right ventricle in good position fluoroscopically, and electrically.  The  patient was given intermittent bolus heparin, monitoring ACTs and received  6,000 units.  He received 2 mg of Versed for sedation.  Visipaque (nonionic  iso-osmolar) was used and the bicarbonate drip was continued.  Aggrastat was  continued.  ACTs were monitored and were therapeutic.  The right coronary  was intubated with 6 [place] JR-4 guiding catheter.   The lesion was crossed carefully with a 0.014 inch Asahi light soft-tipped  guidewire.  We then predilated the lesion with a 3.0/15 undersized Guidant  Voyager balloon at low pressures of four to six atmospheres for 15-30  seconds.  We then crossed the lesion with a Possis 4 French coronary XMI  AngioJet device.  AngioJet runs were done across the stenosis into the mid  RCA.  Because the AngioJet did not move well throughout the lesion it was  withdrawn and not reused.  There was further disruption of the lesion post  AngioJet, so the lesion was with diminished flow.  Initially the lesion was  95-99%, disrupted, segmental, proximal  with streaming of dye distally and  no definite thrombus seen distally.  The PDA and PLA were intact, and  initial TIMI III flow.  After AngioJet thrombectomy TIMI flow was down to II  and after redilatation with a 3.0 Voyager balloon at six atmospheres it  returned to III without further disruption or dissection.  The lesion was  then stented with a Guidant Multi-Link Vision Chromium Cobalt stent,  which was positioned fluoroscopically, 4.0/23 mm, deployed at 12-35  and redilated  at 12-30.  The stent was then crossed using exchange technique with a 5.0/15  Quantum Sci-Med balloon and inflations within the stent were done at 12-50  and 10-35, 10-30 and 10-35.   Prior to stent deployment the patient was given 18 mcg of adenosine IC and  IC nitroglycerin.  He did require transient temporary pacing during AngioJet  thrombectomy, but not during the remainder of the procedure.  There was good  angiographic result proximally and we then addressed the  mid PDA lesion,  which was approximately 85% eccentric in a large PDA branch.  This lesion  was crossed with a 2.5/10 Sci-Med cutting balloon and positioned  fluoroscopically, and dilated 5-40 and 6-35.  The cutting balloon was pulled  back through the stent without difficulty.  IC adenosine was again  administered at 18 mcg.  Final injections were performed by hand.   The proximal stenosis was reduced from 99% to 0.  There was no visible  thrombus or dissection, and good TIMI III flow.  The mid PDA lesion was  reduced from 85% to less than 20% with good flow and no dissection.  The RV,  and PDA/PLA branches were widely patent with no evidence of residual  thrombus and there was no reflow phenomenon.   The patient tolerated the procedure well.  The final ACT was 274 seconds.  The sidearm sheath was flushed, secured to the skin and the patient was  transferred to the holding area for postoperative care in stable condition.  We plan to continue him on IIb, IIIa inhibitor Aggrastat for 18 hours, and  continue aspirin and Plavix.  Obviously this patient needs an exercise  program and weight reduction, which have not been possible in the past, but  fortunately he is a nonsmoker.  He will need continued medical therapy of  his associated problems.  He will need careful follow up of renal function  with recent renal failure as outlined above.   CATHETERIZATION DIAGNOSES:  1. Atherosclerotic heart disease -  unstable angina with positive Cardiolite.  2. Recent renal failure, resolved, off ACE, nonsteroidals and diminished     diuretics.  3. Successful balloon dilatation and AngioJet thrombectomy, and subsequent     large vessel chromium cobalt stenting, and large balloon post dilatation     high-grade disruptive proximal right coronary artery stenosis.  4. Successful mid posterior descending artery cutting balloon atherectomy.  5. Noncritical left coronary disease with widely patent optional diagonal     and twin diagonal stenting from 1997, and collaterals to right.  6. Well-preserved left ventricular function; ejection fraction greater than     55% with segmental wall motion abnormalities on recent angiography, April 03, 2004.  7. Exogenous obesity.  8. Systemic hypertension, remote, normal renal arteries.  9. Insulin-dependent diabetes mellitus.  10.      Hyperuricemia and gout.  11.      Recent renal failure, resolved. 12.      Ex-smoker, quit 30 years ago.  13.      Past back and cervical spine surgeries, Dr. Danielle Dess.                                               Richard A. Alanda Amass, M.D.    RAW/MEDQ  D:  04/04/2004  T:  04/06/2004  Job:  [id number]   cc:   CP Laboratory   Bryan Lemma. Manus Gunning, M.D.  [address] [place]  [place] [address]  Fax: [phone]   Aundra Dubin, M.D.   Brooke Bonito, M.D.  [address]  [place]  [place] [address]  Fax: [phone]   Mindi Slicker. Lowell Guitar, M.D.  [address]  [place]  [place] [address]  Fax: [phone]

## 2011-05-01 NOTE — Op Note (Signed)
[facility]  Patient:    Randall Wilkins, Randall Wilkins                       MRN: [id number] Proc. Date: 01/13/01 Adm. Date:  [id number] Attending:  Jonne Ply                           Operative Report  PREOPERATIVE DIAGNOSIS:  C6-7 left cervical radiculopathy secondary to cervical foraminal stenosis.  POSTOPERATIVE DIAGNOSIS:  C6-7 left cervical radiculopathy secondary to cervical foraminal stenosis.  PROCEDURE:  C6-7 foraminotomy with Met-Rx System and operating microscope, microdissection technique.  SURGEON:  Stefani Dama, M.D.  ASSISTANT:  Tanya Nones. Jeral Fruit, M.D.  ANESTHESIA:  General endotracheal.  INDICATIONS:  The patient is a 70 year old individual, who has had significant neck, shoulder and left arm pain.  He has had previous anterior diskectomy and arthrodesis at C6-7.  A follow-up MRI demonstrated the patient has foraminal stenosis at the left C6-7 foramen.  He was advised regarding surgical decompression via foraminotomy in the sitting position using the Met-Rx System.  PROCEDURE:  The patient was brought to the operating room and placed on the table in supine position.  After smooth induction of general endotracheal anesthesia and placement of a central venous monitoring line, he was placed in a 3-pin headrest into a sitting position.  The back of the neck was prepped with Duraprep and draped in a sterile fashion.  The C6-7 area was localized with fluoroscopy and then a K-wire was passed through the C6-7 laminar facet junction and then using a wanding technique, a series of dilators were placed over this to expose the C6-7 interspace.  A 7 cm long x 18 mm wide endoscope was placed down to the intralaminar space at the facet junction at C6-7.  This was locked to the operating table.  Then, with the microscope being draped and brought into the field, the superficial tissues were cleared using the monopolar cautery and then a  laminotomy and facetectomy of C6-C7 was performed on the left side.  The bone was drilled away using an Anspach drill and a 2.3 mm dissector.  The underlying tissue was then dissected free.  The nerve root was noted to be rather sclerotic in this area.  It was freed and in the lateral recess was freed completely until the nerve root could be sounded out the edge of a foraminal defect.  Once this area was well-decompressed, hemostasis from some bleeding epidural veins particularly underneath the nerve root was contained with some small pledgets of Gelfoam soaked in thrombin, which were later removed.  The area was copiously irrigated with antibiotic irrigating solution.  After all the microdissection was completed, the microscope was removed from the field and then the subcutaneous tissues were closed with 2-0 Vicryl in an interrupted fashion and the skin was closed with 3-0 Vicryl inverted, interrupted fashion. The patient tolerated the procedure well.  Blood loss was estimated at less than 100 cc. DD:  01/13/01 TD:  01/13/01 Job: [id number]

## 2011-07-15 DIAGNOSIS — Z9289 Personal history of other medical treatment: Secondary | ICD-10-CM

## 2011-07-15 HISTORY — DX: Personal history of other medical treatment: Z92.89

## 2011-08-28 ENCOUNTER — Other Ambulatory Visit: Payer: Self-pay | Admitting: Rheumatology

## 2011-08-28 DIAGNOSIS — M5410 Radiculopathy, site unspecified: Secondary | ICD-10-CM

## 2011-09-03 ENCOUNTER — Other Ambulatory Visit: Payer: Self-pay | Admitting: Rheumatology

## 2011-09-03 ENCOUNTER — Ambulatory Visit
Admission: RE | Admit: 2011-09-03 | Discharge: 2011-09-03 | Disposition: A | Discharge status: Home / Self Care | Payer: Medicare Other | Source: Ambulatory Visit | Attending: Rheumatology | Admitting: Rheumatology

## 2011-09-03 DIAGNOSIS — M5410 Radiculopathy, site unspecified: Secondary | ICD-10-CM

## 2011-09-03 MED ORDER — IOHEXOL 180 MG/ML  SOLN
1.0000 mL | Freq: Once | INTRAMUSCULAR | Status: AC | PRN
  Administered 2011-09-03: 1 mL via EPIDURAL

## 2011-09-03 MED ORDER — METHYLPREDNISOLONE ACETATE 40 MG/ML INJ SUSP (RADIOLOG
120.0000 mg | Freq: Once | INTRAMUSCULAR | Status: AC
  Administered 2011-09-03: 120 mg via EPIDURAL

## 2012-01-20 ENCOUNTER — Other Ambulatory Visit: Payer: Self-pay | Admitting: Rheumatology

## 2012-01-20 DIAGNOSIS — M5416 Radiculopathy, lumbar region: Secondary | ICD-10-CM

## 2012-01-26 ENCOUNTER — Ambulatory Visit
Admission: RE | Admit: 2012-01-26 | Discharge: 2012-01-26 | Disposition: A | Discharge status: Home / Self Care | Payer: Medicare Other | Source: Ambulatory Visit | Attending: Rheumatology | Admitting: Rheumatology

## 2012-01-26 VITALS — BP 166/64 | HR 52 | Ht 69.0 in | Wt 272.0 lb

## 2012-01-26 DIAGNOSIS — M5126 Other intervertebral disc displacement, lumbar region: Secondary | ICD-10-CM

## 2012-01-26 DIAGNOSIS — M5416 Radiculopathy, lumbar region: Secondary | ICD-10-CM

## 2012-07-26 ENCOUNTER — Other Ambulatory Visit: Payer: Self-pay | Admitting: Rheumatology

## 2012-07-26 DIAGNOSIS — M542 Cervicalgia: Secondary | ICD-10-CM

## 2012-07-29 ENCOUNTER — Ambulatory Visit
Admission: RE | Admit: 2012-07-29 | Discharge: 2012-07-29 | Disposition: A | Discharge status: Home / Self Care | Payer: Medicare Other | Source: Ambulatory Visit | Attending: Rheumatology | Admitting: Rheumatology

## 2012-07-29 DIAGNOSIS — M542 Cervicalgia: Secondary | ICD-10-CM

## 2012-08-02 ENCOUNTER — Other Ambulatory Visit: Payer: Self-pay | Admitting: Rheumatology

## 2012-08-02 ENCOUNTER — Ambulatory Visit
Admission: RE | Admit: 2012-08-02 | Discharge: 2012-08-02 | Disposition: A | Discharge status: Home / Self Care | Payer: Medicare Other | Source: Ambulatory Visit | Attending: Rheumatology | Admitting: Rheumatology

## 2012-08-02 DIAGNOSIS — R9389 Abnormal findings on diagnostic imaging of other specified body structures: Secondary | ICD-10-CM

## 2012-08-03 ENCOUNTER — Other Ambulatory Visit: Payer: Self-pay | Admitting: Rheumatology

## 2012-08-03 DIAGNOSIS — M4800 Spinal stenosis, site unspecified: Secondary | ICD-10-CM

## 2012-08-09 ENCOUNTER — Ambulatory Visit
Admission: RE | Admit: 2012-08-09 | Discharge: 2012-08-09 | Disposition: A | Discharge status: Home / Self Care | Payer: Medicare Other | Source: Ambulatory Visit | Attending: Rheumatology | Admitting: Rheumatology

## 2012-08-09 DIAGNOSIS — M4800 Spinal stenosis, site unspecified: Secondary | ICD-10-CM

## 2012-08-09 MED ORDER — IOHEXOL 300 MG/ML  SOLN
1.0000 mL | Freq: Once | INTRAMUSCULAR | Status: AC | PRN
  Administered 2012-08-09: 1 mL via EPIDURAL

## 2012-08-09 MED ORDER — TRIAMCINOLONE ACETONIDE 40 MG/ML IJ SUSP (RADIOLOGY)
60.0000 mg | Freq: Once | INTRAMUSCULAR | Status: AC
  Administered 2012-08-09: 60 mg via EPIDURAL

## 2012-08-22 ENCOUNTER — Other Ambulatory Visit: Payer: Self-pay | Admitting: Rheumatology

## 2012-08-22 DIAGNOSIS — M545 Low back pain, unspecified: Secondary | ICD-10-CM

## 2012-08-29 ENCOUNTER — Ambulatory Visit
Admission: RE | Admit: 2012-08-29 | Discharge: 2012-08-29 | Disposition: A | Discharge status: Home / Self Care | Payer: Medicare Other | Source: Ambulatory Visit | Attending: Rheumatology | Admitting: Rheumatology

## 2012-08-29 VITALS — BP 159/61 | HR 46

## 2012-08-29 DIAGNOSIS — M545 Low back pain, unspecified: Secondary | ICD-10-CM

## 2012-08-29 MED ORDER — IOHEXOL 180 MG/ML  SOLN
1.0000 mL | Freq: Once | INTRAMUSCULAR | Status: AC | PRN
  Administered 2012-08-29: 1 mL via EPIDURAL

## 2012-08-29 MED ORDER — METHYLPREDNISOLONE ACETATE 40 MG/ML INJ SUSP (RADIOLOG
120.0000 mg | Freq: Once | INTRAMUSCULAR | Status: AC
  Administered 2012-08-29: 120 mg via EPIDURAL

## 2012-11-24 ENCOUNTER — Other Ambulatory Visit: Payer: Self-pay | Admitting: Cardiovascular Disease

## 2012-11-24 DIAGNOSIS — R413 Other amnesia: Secondary | ICD-10-CM

## 2012-11-30 ENCOUNTER — Ambulatory Visit
Admission: RE | Admit: 2012-11-30 | Discharge: 2012-11-30 | Disposition: A | Discharge status: Home / Self Care | Payer: Medicare Other | Source: Ambulatory Visit | Attending: Cardiovascular Disease | Admitting: Cardiovascular Disease

## 2012-11-30 DIAGNOSIS — R413 Other amnesia: Secondary | ICD-10-CM

## 2013-02-04 ENCOUNTER — Encounter: Payer: Self-pay | Admitting: Nurse Practitioner

## 2013-02-04 DIAGNOSIS — E119 Type 2 diabetes mellitus without complications: Secondary | ICD-10-CM

## 2013-02-04 DIAGNOSIS — R413 Other amnesia: Secondary | ICD-10-CM | POA: Insufficient documentation

## 2013-02-04 DIAGNOSIS — I639 Cerebral infarction, unspecified: Secondary | ICD-10-CM | POA: Insufficient documentation

## 2013-02-04 DIAGNOSIS — E785 Hyperlipidemia, unspecified: Secondary | ICD-10-CM | POA: Insufficient documentation

## 2013-02-04 DIAGNOSIS — E139 Other specified diabetes mellitus without complications: Secondary | ICD-10-CM | POA: Insufficient documentation

## 2013-02-04 DIAGNOSIS — M6282 Rhabdomyolysis: Secondary | ICD-10-CM | POA: Insufficient documentation

## 2013-02-04 HISTORY — DX: Other specified diabetes mellitus without complications: E13.9

## 2013-03-06 ENCOUNTER — Ambulatory Visit (INDEPENDENT_AMBULATORY_CARE_PROVIDER_SITE_OTHER): Payer: Medicare Other | Admitting: Nurse Practitioner

## 2013-03-06 ENCOUNTER — Encounter: Payer: Self-pay | Admitting: Nurse Practitioner

## 2013-03-06 VITALS — BP 126/61 | HR 70 | Ht 69.5 in | Wt 265.0 lb

## 2013-03-06 DIAGNOSIS — I639 Cerebral infarction, unspecified: Secondary | ICD-10-CM

## 2013-03-06 DIAGNOSIS — I6350 Cerebral infarction due to unspecified occlusion or stenosis of unspecified cerebral artery: Secondary | ICD-10-CM

## 2013-03-06 DIAGNOSIS — R413 Other amnesia: Secondary | ICD-10-CM

## 2013-03-06 NOTE — Patient Instructions (Addendum)
Mild memory difficulties, perform strategy games to help memory. Continue Plavix and aspirin for secondary stroke prevention. Continue to be as active as possible and ambulating with a cane to prevent falls. Return to the clinic in 6 months for evaluation of memory.

## 2013-03-06 NOTE — Progress Notes (Signed)
HPI: Pleasant male returns for followup for mild memory trouble. MRI reviewed by Dr. Terrace Arabia with evidence of old left parietal subcortical stroke and mild small vessel disease. Memory is stable according to patient and wife. Labs for treatable causes of memory loss were negative. Diabetes is stable per patient. He denies any falls. He ambulates with a cane and has a AFO to left lower leg. He also has peripheral neuropathy from his diabetes.  ROS:  See above visit information  Physical Exam General: well developed, well nourished, obese male seated, in no evident distress Head: head normocephalic and atraumatic. Orohparynx benign Neck: supple with no carotid or supraclavicular bruits Cardiovascular: regular rate and rhythm, no murmurs  Neurologic Exam Mental Status: Awake and fully alert. Oriented to place and time. Recent and remote memory intact. Attention span, concentration and fund of knowledge appropriate. Mood and affect appropriate.  Cranial Nerves:  Pupils equal, briskly reactive to light. Extraocular movements full without nystagmus. Visual fields full to confrontation. Hearing intact and symmetric to finer snap. Facial sensation intact. Face, tongue, palate move normally and symmetrically. Neck flexion and extension normal.  Motor: Normal bulk and tone on the right. Normal strength on right. Left lower ext weakness, AFO in place.   Sensory.:decreased touch,  pinprick and vibratory to knee left extremity.  Coordination: Rapid alternating movements normal in all extremities. Finger-to-nose and heel-to-shin performed accurately bilaterally. Gait and Station: Arises from chair without difficulty. Stance is wide based. Ambulated short distance with cane, no difficulty with turns.   Reflexes: 1+ and symmetric except absent achilles. Plantars are flexor.     ASSESSMENT: Mild memory difficulty which is stable, vascular risk factors of hypertension, hyperlipidemia, obesity, coronary artery disease  and stroke in left parietal area.     PLAN: Continue aspirin and Plavix for secondary stroke prevention, labs for memory loss returned normal. Followup 6 months.    Nilda Riggs, GNP-BC APRN

## 2013-03-14 HISTORY — PX: OTHER SURGICAL HISTORY: SHX169

## 2013-03-17 ENCOUNTER — Other Ambulatory Visit (HOSPITAL_COMMUNITY): Payer: Self-pay | Admitting: Cardiovascular Disease

## 2013-03-17 DIAGNOSIS — I639 Cerebral infarction, unspecified: Secondary | ICD-10-CM

## 2013-03-17 DIAGNOSIS — R0989 Other specified symptoms and signs involving the circulatory and respiratory systems: Secondary | ICD-10-CM

## 2013-03-28 ENCOUNTER — Ambulatory Visit (HOSPITAL_COMMUNITY)
Admission: RE | Admit: 2013-03-28 | Discharge: 2013-03-28 | Disposition: A | Discharge status: Home / Self Care | Payer: Medicare Other | Source: Ambulatory Visit | Attending: Cardiovascular Disease | Admitting: Cardiovascular Disease

## 2013-03-28 DIAGNOSIS — I6350 Cerebral infarction due to unspecified occlusion or stenosis of unspecified cerebral artery: Secondary | ICD-10-CM | POA: Insufficient documentation

## 2013-03-28 DIAGNOSIS — I639 Cerebral infarction, unspecified: Secondary | ICD-10-CM

## 2013-03-28 DIAGNOSIS — R0989 Other specified symptoms and signs involving the circulatory and respiratory systems: Secondary | ICD-10-CM

## 2013-03-28 NOTE — Progress Notes (Signed)
Carotid Duplex Completed. Randall Wilkins D  

## 2013-07-28 ENCOUNTER — Other Ambulatory Visit: Payer: Self-pay | Admitting: *Deleted

## 2013-07-28 DIAGNOSIS — I350 Nonrheumatic aortic (valve) stenosis: Secondary | ICD-10-CM

## 2013-08-08 ENCOUNTER — Ambulatory Visit (HOSPITAL_COMMUNITY)
Admission: RE | Admit: 2013-08-08 | Discharge: 2013-08-08 | Disposition: A | Discharge status: Home / Self Care | Payer: Medicare Other | Source: Ambulatory Visit | Attending: Cardiology | Admitting: Cardiology

## 2013-08-08 DIAGNOSIS — I517 Cardiomegaly: Secondary | ICD-10-CM | POA: Insufficient documentation

## 2013-08-08 DIAGNOSIS — Z8673 Personal history of transient ischemic attack (TIA), and cerebral infarction without residual deficits: Secondary | ICD-10-CM | POA: Insufficient documentation

## 2013-08-08 DIAGNOSIS — I350 Nonrheumatic aortic (valve) stenosis: Secondary | ICD-10-CM

## 2013-08-08 DIAGNOSIS — I079 Rheumatic tricuspid valve disease, unspecified: Secondary | ICD-10-CM | POA: Insufficient documentation

## 2013-08-08 DIAGNOSIS — I359 Nonrheumatic aortic valve disorder, unspecified: Secondary | ICD-10-CM | POA: Insufficient documentation

## 2013-08-08 DIAGNOSIS — I059 Rheumatic mitral valve disease, unspecified: Secondary | ICD-10-CM | POA: Insufficient documentation

## 2013-08-08 DIAGNOSIS — G4733 Obstructive sleep apnea (adult) (pediatric): Secondary | ICD-10-CM | POA: Insufficient documentation

## 2013-08-08 HISTORY — PX: TRANSTHORACIC ECHOCARDIOGRAM: SHX275

## 2013-08-08 NOTE — Progress Notes (Signed)
[facility]   2D echo completed 08/08/2013.   Cindy Jehieli Brassell, RDCS  

## 2013-09-06 ENCOUNTER — Ambulatory Visit (INDEPENDENT_AMBULATORY_CARE_PROVIDER_SITE_OTHER): Payer: Medicare Other | Admitting: Nurse Practitioner

## 2013-09-06 ENCOUNTER — Encounter: Payer: Self-pay | Admitting: Nurse Practitioner

## 2013-09-06 VITALS — BP 130/80 | HR 68 | Ht 69.0 in | Wt 265.0 lb

## 2013-09-06 DIAGNOSIS — I639 Cerebral infarction, unspecified: Secondary | ICD-10-CM

## 2013-09-06 DIAGNOSIS — R413 Other amnesia: Secondary | ICD-10-CM

## 2013-09-06 DIAGNOSIS — I6350 Cerebral infarction due to unspecified occlusion or stenosis of unspecified cerebral artery: Secondary | ICD-10-CM

## 2013-09-06 MED ORDER — DONEPEZIL HCL 5 MG PO TABS: 5.0000 mg | ORAL_TABLET | Freq: Every day | ORAL | Status: DC

## 2013-09-06 NOTE — Progress Notes (Signed)
[facility]  PATIENT: Randall Wilkins DOB: April 08, 1941   REASON FOR VISIT: Followup for history of stroke and memory loss   HISTORY OF PRESENT ILLNESS: [name], 72 year old male returns for followup. He has not had further stroke or TIA symptoms. He is currently on Plavix and aspirin with moderate bruising. MRI reviewed by Dr. Terrace Arabia with evidence of old left parietal subcortical stroke and mild small vessel disease. Memory is slightly worse  according to patient and wife. Labs for treatable causes of memory loss were negative. Diabetes is stable per patient. He denies any falls. He ambulates with a cane and has a AFO to left lower leg. He also has peripheral neuropathy from his diabetes.   HISTORY: He had a past medical history of hypertension, insulin-dependent diabetes, hyperlipidemia, coronary artery disease, diabetic peripheral neuropathy, He had 12 years of education, was disabled in his early fifties, he worked as a Clinical biochemist, because of back injury, multiple low back surgery, he also had a history of rhabdomyolysis, ventilation hemodialysis dependent for 2 weeks, following a fall accident At baseline he ambulates with a left ankle brace since his fall injury 5 years ago, still active at home, manage his own finances, driving without difficulty, was able to fix home electricity such as lawn mow without difficulty, over the past 6 months, he became forgetful, he tends to repeat himself, forgot name, He had MRI at [facility]   reviewed the film,  there is evidence of old left parietal subcortical stroke, mild small vessel disease,   echocardiogram July 2013 showed ejection fraction 55%,RVSP of 36, mild aortic valvular stenosis,  BUN and creatinine 23/1 point 38, LDL 45, triglyceride 104,      REVIEW OF SYSTEMS: Full 14 system review of systems performed and notable only for:  Constitutional: Fatigue Cardiovascular: Swelling in the  legs Ear/Nose/Throat: N/A  Skin: Easy bruising Eyes: N/A  Respiratory: N/A  Gastroitestinal: N/A  Hematology/Lymphatic: N/A  Endocrine: Increased thirst, feeling cold Musculoskeletal:N/A  Allergy/Immunology: N/A  Neurological: Memory loss Psychiatric: N/A   ALLERGIES: Allergies  Allergen Reactions  . Niaspan [Niacin Er] Itching and Rash    HOME MEDICATIONS: Outpatient Prescriptions Prior to Visit  Medication Sig Dispense Refill  . allopurinol (ZYLOPRIM) 100 MG tablet Take 100 mg by mouth daily.      Marland Kitchen amitriptyline (ELAVIL) 25 MG tablet Take 25 mg by mouth at bedtime.      Marland Kitchen amLODipine (NORVASC) 5 MG tablet Take 5 mg by mouth daily.      Marland Kitchen aspirin 81 MG tablet Take 81 mg by mouth daily.      . clopidogrel (PLAVIX) 75 MG tablet Take 75 mg by mouth daily.      . fish oil-omega-3 fatty acids 1000 MG capsule Take 1 g by mouth daily.      Marland Kitchen HYDROcodone-acetaminophen (NORCO) 10-325 MG per tablet Take 1 tablet by mouth every 6 (six) hours as needed for pain.      Marland Kitchen insulin glargine (LANTUS) 100 UNIT/ML injection Inject into the skin at bedtime.      . insulin lispro (HUMALOG) 100 UNIT/ML injection Inject into the skin 3 (three) times daily before meals.      Marland Kitchen lisinopril (PRINIVIL,ZESTRIL) 40 MG tablet Take 40 mg by mouth daily.      . metoprolol succinate (TOPROL-XL) 25 MG 24 hr tablet Take 25 mg by mouth daily.      . rosuvastatin (CRESTOR) 20 MG tablet Take 20 mg by  mouth daily.      . Tamsulosin HCl (FLOMAX) 0.4 MG CAPS Take by mouth.      . torsemide (DEMADEX) 10 MG tablet Take 50 mg by mouth daily.       No facility-administered medications prior to visit.    PAST MEDICAL HISTORY: Past Medical History  Diagnosis Date  . Rhabdomyolysis   . Rhabdomyolysis   . Rhabdomyolysis   . Rhabdomyolysis   . Rhabdomyolysis   . Memory loss     PAST SURGICAL HISTORY: Past Surgical History  Procedure Laterality Date  . Back surgery    . Cholecystectomy    . Sinus endo w/fusion       FAMILY HISTORY: Family History  Problem Relation Age of Onset  . Heart Problems Father     SOCIAL HISTORY: History   Social History  . Marital Status: Married    Spouse Name: N/A    Number of Children: N/A  . Years of Education: High school   Occupational History  . Not on file.   Social History Main Topics  . Smoking status: Former Smoker -- 2.00 packs/day for 20 years    Types: Cigarettes    Quit date: 01/25/1973  . Smokeless tobacco: Never Used  . Alcohol Use: No  . Drug Use: No  . Sexual Activity: Not on file   Other Topics Concern  . Not on file   Social History Narrative  . No narrative on file     PHYSICAL EXAM  Filed Vitals:   09/06/13 0948  BP: 130/80  Pulse: 68  Height: 5\' 9"  (1.753 m)  Weight: 265 lb (120.203 kg)   Body mass index is 39.12 kg/(m^2).  Generalized: Well developed, in no acute distress  Head: normocephalic and atraumatic,. Oropharynx benign  Neck: Supple, no carotid bruits  Cardiac: Regular rate rhythm, no murmur    Neurological examination   Mentation: Alert oriented to time, place, history taking. MMSE 26/30. AFT 10. Follows all commands speech and language fluent  Cranial nerve II-XII: Pupils were equal round reactive to light extraocular movements were full, visual field were full on confrontational test. Facial sensation and strength were normal. hearing was intact to finger rubbing bilaterally. Uvula tongue midline. head turning and shoulder shrug and were normal and symmetric.Tongue protrusion into cheek strength was normal. Motor: normal bulk and tone on the right, normal strength on the right. Left lower extremity weakness with AFO in place and foot drop Sensory: Decreased touch pinprick and vibratory to knee on the left extremity   Coordination: finger-nose-finger, performed smoothly, unable to perform heel-to-shin on the left. Reflexes 1+ and symmetric except absent Achilles, plantar  responses were flexor  bilaterally. Gait and Station: Rising up from seated position without assistance,wide  stance, ambulate short distance with cane, no difficulty with turns   DIAGNOSTIC DATA (LABS, IMAGING, TESTING) -None to review   ASSESSMENT AND PLAN  72 y.o. year old male  has a past medical history of Rhabdomyolysis; left parietal subcortical stroke, mild small vessel disease and Memory loss. here for followup. No further stroke or TIA symptoms. Memory loss is slightly worse her patient and wife, would like to try a medication to see if it is beneficial   Aricept 5 mg by mouth daily Continue aspirin and Plavix for secondary stroke prevention Continue CPAP for sleep apnea Followup in 6 months Nilda Riggs, [facility], [facility], APRN  [facility] [address], [address] [place], [place] [address] [phone]

## 2013-09-06 NOTE — Patient Instructions (Addendum)
Aricept 5 mg by mouth daily Continue aspirin and Plavix for secondary stroke prevention Continue CPAP for sleep apnea Followup in 6 months

## 2013-09-07 ENCOUNTER — Encounter: Payer: Self-pay | Admitting: Cardiovascular Disease

## 2013-11-13 ENCOUNTER — Encounter: Payer: Self-pay | Admitting: *Deleted

## 2013-11-14 ENCOUNTER — Ambulatory Visit (INDEPENDENT_AMBULATORY_CARE_PROVIDER_SITE_OTHER): Payer: Medicare Other | Admitting: Internal Medicine

## 2013-11-14 ENCOUNTER — Encounter: Payer: Self-pay | Admitting: Internal Medicine

## 2013-11-14 VITALS — BP 128/62 | HR 56 | Ht 69.0 in | Wt 265.2 lb

## 2013-11-14 DIAGNOSIS — I350 Nonrheumatic aortic (valve) stenosis: Secondary | ICD-10-CM | POA: Insufficient documentation

## 2013-11-14 DIAGNOSIS — E1049 Type 1 diabetes mellitus with other diabetic neurological complication: Secondary | ICD-10-CM

## 2013-11-14 DIAGNOSIS — Z23 Encounter for immunization: Secondary | ICD-10-CM

## 2013-11-14 DIAGNOSIS — E1042 Type 1 diabetes mellitus with diabetic polyneuropathy: Secondary | ICD-10-CM

## 2013-11-14 DIAGNOSIS — E1021 Type 1 diabetes mellitus with diabetic nephropathy: Secondary | ICD-10-CM | POA: Insufficient documentation

## 2013-11-14 DIAGNOSIS — I359 Nonrheumatic aortic valve disorder, unspecified: Secondary | ICD-10-CM

## 2013-11-14 DIAGNOSIS — G909 Disorder of the autonomic nervous system, unspecified: Secondary | ICD-10-CM

## 2013-11-14 DIAGNOSIS — E119 Type 2 diabetes mellitus without complications: Secondary | ICD-10-CM

## 2013-11-14 DIAGNOSIS — E669 Obesity, unspecified: Secondary | ICD-10-CM

## 2013-11-14 DIAGNOSIS — N058 Unspecified nephritic syndrome with other morphologic changes: Secondary | ICD-10-CM

## 2013-11-14 DIAGNOSIS — E1029 Type 1 diabetes mellitus with other diabetic kidney complication: Secondary | ICD-10-CM

## 2013-11-14 DIAGNOSIS — I639 Cerebral infarction, unspecified: Secondary | ICD-10-CM

## 2013-11-14 DIAGNOSIS — I2510 Atherosclerotic heart disease of native coronary artery without angina pectoris: Secondary | ICD-10-CM

## 2013-11-14 DIAGNOSIS — I4949 Other premature depolarization: Secondary | ICD-10-CM

## 2013-11-14 DIAGNOSIS — I6350 Cerebral infarction due to unspecified occlusion or stenosis of unspecified cerebral artery: Secondary | ICD-10-CM

## 2013-11-14 DIAGNOSIS — I493 Ventricular premature depolarization: Secondary | ICD-10-CM | POA: Insufficient documentation

## 2013-11-14 HISTORY — DX: Obesity, unspecified: E66.9

## 2013-11-14 HISTORY — DX: Nonrheumatic aortic (valve) stenosis: I35.0

## 2013-11-14 HISTORY — DX: Type 1 diabetes mellitus with diabetic polyneuropathy: E10.42

## 2013-11-14 NOTE — Patient Instructions (Signed)
Your physician wants you to follow-up in: 6 months with Dr. Rennis Golden. You will receive a reminder letter in the mail two months in advance. If you don't receive a letter, please call our office to schedule the follow-up appointment.  You got the FLU vaccine today.

## 2013-11-14 NOTE — Progress Notes (Signed)
OFFICE NOTE  Chief Complaint:  Some LE swelling  Primary Care Physician: Thora Lance, MD  HPI:  Brace S [name] is a pleasant 72 year old male patient who was formerly followed by Dr. Alanda Amass. He is here to establish cardiac care with [place] today. He has a history of obesity, multiple comorbidities including insulin-dependent diabetes, diabetic peripheral neuropathy, diabetic nephropathy, venous insufficiency and peripheral neuropathy. He also has obstructive sleep apnea on CPAP and coronary artery disease. He unfortunately suffered a left parietal stroke with small lacunar infarcts noted on MRI in 2013. He is a left foot drop from an L4/L5 neuropathy and wears a brace for this. From a cardiac standpoint he had a large non-drug-eluting stent placed in the RCA in 2005. He has had a stent placed at 97 to a diagonal which was noted to be patent. He's had some problems with dizziness which is improved. He [place] as large to medium secondary to neuropathy and venous insufficiency. Unfortunately not been able to lose a lot of weight due to his difficulty in ambulating and painful neuropathy.  He denies any cardiac chest pain or worsening shortness of breath.  PMHx:  Past Medical History  Diagnosis Date  . Rhabdomyolysis   . Memory loss   . Exogenous obesity   . Insulin dependent diabetes mellitus   . Peripheral neuropathy   . Venous insufficiency   . OSA on CPAP   . CAD (coronary artery disease)   . Hyperlipidemia   . Gout   . Stroke     L patietal with small scattered lacunar infarcts  . Left foot drop   . Hypertension   . History of nuclear stress test 07/2011    dipyridamole; fixed inferolateral defect, worse at stress than rest; no reversible ischemia; low risk scan     Past Surgical History  Procedure Laterality Date  . Back surgery      lumbosacral  . Cholecystectomy    . Sinus endo w/fusion    . Coronary angioplasty  10/13/1996  . Rotator cuff repair Right   .  Transthoracic echocardiogram  08/08/2013    EF 55-60%, mild conc hypertrophy, grade 1 diastolic dysfunction; AV with mild stenosis; LA & RA mildly dilated  . Coronary angioplasty  09/21/1989    emergency PTCA  . Coronary angioplasty  10/13/1996    Multi-Link diagonal & OD stenting (Dr. Jonette Eva)  . Coronary angioplasty  12/03/1997    disease of mid DX-1 ~50% & in mid PLA & PDA (distal lesions) (Dr. Jonette Eva)   . Coronary angioplasty  10/14/1999    progression of disease distal PLA & PDA; progression of disease prox RCA - moderate (Dr. Jonette Eva)   . Coronary angioplasty with stent placement  04/04/2004    4.0x41mm non-DES (thrombectomy via AngioJet) to RCA for high grade stenosis (Dr. Jonette Eva)  . Carotid doppler  03/2013    bilat bulb/prox ICAs - mild amount of fibrous plaque with no evidence of diameter reduction    FAMHx:  Family History  Problem Relation Age of Onset  . Coronary artery disease Father   . Heart disease Mother   . Cancer Maternal Grandmother   . Heart Problems Maternal Grandfather     SOCHx:   reports that he quit smoking about 40 years ago. His smoking use included Cigarettes. He has a 40 pack-year smoking history. He has never used smokeless tobacco. He reports that he does not drink alcohol or use illicit drugs.  ALLERGIES:  Allergies  Allergen Reactions  . Niaspan [Niacin Er] Itching and Rash    ROS: A comprehensive review of systems was negative except for: Cardiovascular: positive for lower extremity edema Neurological: positive for gait problems and neuropathy Endocrine: positive for diabetic neuropathy  HOME MEDS: Current Outpatient Prescriptions  Medication Sig Dispense Refill  . allopurinol (ZYLOPRIM) 100 MG tablet Take 300 mg by mouth daily.       Marland Kitchen amitriptyline (ELAVIL) 25 MG tablet Take 75 mg by mouth at bedtime.       Marland Kitchen amLODipine (NORVASC) 5 MG tablet Take 5 mg by mouth daily.      Marland Kitchen aspirin EC 81 MG tablet Take 81 mg by mouth  daily.      . clopidogrel (PLAVIX) 75 MG tablet Take 75 mg by mouth daily.      . colchicine 0.6 MG tablet Take 0.6 mg by mouth daily as needed.      . donepezil (ARICEPT) 5 MG tablet Take 1 tablet (5 mg total) by mouth daily.  30 tablet  6  . fish oil-omega-3 fatty acids 1000 MG capsule Take 2 g by mouth daily.       Marland Kitchen HYDROcodone-acetaminophen (NORCO) 10-325 MG per tablet Take 1 tablet by mouth every 6 (six) hours as needed for pain.      Marland Kitchen insulin glargine (LANTUS) 100 UNIT/ML injection Inject 45 Units into the skin at bedtime.       . insulin lispro (HUMALOG) 100 UNIT/ML injection Inject into the skin 3 (three) times daily before meals.      Marland Kitchen lisinopril (PRINIVIL,ZESTRIL) 5 MG tablet Take 5 mg by mouth daily.      . metoprolol succinate (TOPROL-XL) 25 MG 24 hr tablet Take 25 mg by mouth daily.      . NON FORMULARY at bedtime. CPAP      . potassium chloride SA (K-DUR,KLOR-CON) 20 MEQ tablet Take 20 mEq by mouth 2 (two) times daily.      . rosuvastatin (CRESTOR) 20 MG tablet Take 20 mg by mouth daily.      . Tamsulosin HCl (FLOMAX) 0.4 MG CAPS Take by mouth.      . torsemide (DEMADEX) 10 MG tablet Take 50 mg by mouth daily.       No current facility-administered medications for this visit.    LABS/IMAGING: No results found for this or any previous visit (from the past 48 hour(s)). No results found.  VITALS: BP 128/62  Pulse 56  Ht 5\' 9"  (1.753 m)  Wt 265 lb 3.2 oz (120.294 kg)  BMI 39.15 kg/m2  EXAM: General appearance: alert and no distress Neck: no carotid bruit and no JVD Lungs: clear to auscultation bilaterally Heart: regular rate and rhythm and occasional missed beats Abdomen: obese, soft, non-tender, +BS Extremities: edema trace to 1+ bilaterally, brace on the left leg Pulses: 2+ and symmetric Skin: Skin color, texture, turgor normal. No rashes or lesions Neurologic: Mental status: Alert, oriented, thought content appropriate Psych: Pleasant mood,  affect  EKG: Sinus bradycardia with first degree AV block at 56, PVCs  ASSESSMENT: 1. Coronary artery disease status post BMS to the RCA in 2005, and remote BMS to the diagonal and 1997 2. Obesity 3. Hypertension-controlled 4. Dyslipidemia 5. Prior stroke 6. Diabetic neuropathy 7. Diabetic nephropathy 8. Type 1 diabetes on insulin  PLAN: 1.   Mr. [name] is doing fairly well despite multiple comorbidities. He had a stroke in the distant past but has relatively few deficits. Unfortunately he  has some problems with memory loss. He does suffer from painful neuropathy and has a foot drop from the L4-L5 level. His diabetes is fairly well controlled by Dr. Juleen China. He also has dyslipidemia which is followed regularly. He denies any cardiac chest pain.  Unfortunately he is unable to lose weight due to his neuropathy. We'll go ahead and administer a flu shot today as he's not had one this year. Plan to see him back in 6 months along with his wife.  Chrystie Nose, MD, [facility] Attending Cardiologist [facility]  HILTY,Kenneth C 11/14/2013, 9:34 AM

## 2014-03-08 ENCOUNTER — Ambulatory Visit (INDEPENDENT_AMBULATORY_CARE_PROVIDER_SITE_OTHER): Payer: Medicare Other | Admitting: Nurse Practitioner

## 2014-03-08 ENCOUNTER — Encounter: Payer: Self-pay | Admitting: Nurse Practitioner

## 2014-03-08 ENCOUNTER — Encounter (INDEPENDENT_AMBULATORY_CARE_PROVIDER_SITE_OTHER): Payer: Self-pay

## 2014-03-08 VITALS — BP 115/56 | HR 59 | Ht 69.0 in | Wt 263.0 lb

## 2014-03-08 DIAGNOSIS — I639 Cerebral infarction, unspecified: Secondary | ICD-10-CM

## 2014-03-08 DIAGNOSIS — I6350 Cerebral infarction due to unspecified occlusion or stenosis of unspecified cerebral artery: Secondary | ICD-10-CM

## 2014-03-08 DIAGNOSIS — R413 Other amnesia: Secondary | ICD-10-CM

## 2014-03-08 MED ORDER — DONEPEZIL HCL 10 MG PO TABS: 10.0000 mg | ORAL_TABLET | Freq: Every day | ORAL | Status: DC

## 2014-03-08 NOTE — Progress Notes (Signed)
[facility]  PATIENT: Randall Wilkins DOB: 1941/10/02   REASON FOR VISIT: follow up for memory loss and history of stroke    HISTORY OF PRESENT ILLNESS:Randall Wilkins, 73 year old male returns for followup. He has not had further stroke or TIA symtoms. He is currently on Plavix and aspirin with moderate bruising. Aricept was begun for memory at last visit. He denies side effects to the drug.  Labs for treatable causes of memory loss were negative. Diabetes is stable per patient he just had another med added. Marland Kitchen He denies any falls. He ambulates with a cane and has a AFO to left lower leg. He also has peripheral neuropathy from his diabetes. He has a history of gout. He returns for reevaluation.   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, multiple low back surgery, he also had a history of rhabdomyolysis, ventilation hemodialysis dependent for 2 weeks, following a fall accident  At baseline he ambulates with a left ankle brace since his fall injury 5 years ago, still active at home, manage his own finances, driving without difficulty, was able to fix home electricity such as lawn mow without difficulty, over the past 6 months, he became forgetful, he tends to repeat himself, forgot name,  He had MRI at [facility] reviewed the film, there is evidence of old left parietal subcortical stroke, mild small vessel disease,  echocardiogram July 2013 showed ejection fraction 55%,RVSP of 36, mild aortic valvular stenosis,  BUN and creatinine 23/1 point 38, LDL 45, triglyceride 104,      REVIEW OF SYSTEMS: Full 14 system review of systems performed and notable only for those listed, all others are neg:  Constitutional: N/A  Cardiovascular:  leg swelling Ear/Nose/Throat: N/A  Skin: N/A  Eyes: N/A  Respiratory: N/A  Gastroitestinal: N/A  Hematology/Lymphatic: N/A  Endocrine: excessive thirst Musculoskeletal:joint pain, walking difficulty  Allergy/Immunology: N/A  Hematologic: easy bruising Neurological memory loss, headache, numbness Psychiatric: N/A Sleep OSA, daytime sleepiness   ALLERGIES: Allergies  Allergen Reactions  . Niaspan [Niacin Er] Itching and Rash    HOME MEDICATIONS: Outpatient Prescriptions Prior to Visit  Medication Sig Dispense Refill  . allopurinol (ZYLOPRIM) 100 MG tablet Take 300 mg by mouth daily.       Marland Kitchen amitriptyline (ELAVIL) 25 MG tablet Take 75 mg by mouth at bedtime.       Marland Kitchen amLODipine (NORVASC) 5 MG tablet Take 5 mg by mouth daily.      Marland Kitchen aspirin EC 81 MG tablet Take 81 mg by mouth daily.      . clopidogrel (PLAVIX) 75 MG tablet Take 75 mg by mouth daily.      . colchicine 0.6 MG tablet Take 0.6 mg by mouth daily as needed.      . donepezil (ARICEPT) 5 MG tablet Take 1 tablet (5 mg total) by mouth daily.  30 tablet  6  . fish oil-omega-3 fatty acids 1000 MG capsule Take 2 g by mouth daily.       Marland Kitchen HYDROcodone-acetaminophen (NORCO) 10-325 MG per tablet Take 1 tablet by mouth every 6 (six) hours as needed for pain.      Marland Kitchen insulin glargine (LANTUS) 100 UNIT/ML injection Inject 45 Units into the skin at bedtime.       Marland Kitchen  insulin lispro (HUMALOG) 100 UNIT/ML injection Inject into the skin 3 (three) times daily before meals.      Marland Kitchen lisinopril (PRINIVIL,ZESTRIL) 5 MG tablet Take 5 mg by mouth daily.      . metoprolol succinate (TOPROL-XL) 25 MG 24 hr tablet Take 25 mg by mouth daily.      . NON FORMULARY at bedtime. CPAP      . potassium chloride SA (K-DUR,KLOR-CON) 20 MEQ tablet Take 20 mEq by mouth 2 (two) times daily.      . rosuvastatin (CRESTOR) 20 MG tablet Take 20 mg by mouth daily.      . Tamsulosin HCl (FLOMAX) 0.4 MG CAPS Take by mouth.      . torsemide (DEMADEX) 10 MG tablet Take 50  mg by mouth daily.       No facility-administered medications prior to visit.    PAST MEDICAL HISTORY: Past Medical History  Diagnosis Date  . Rhabdomyolysis   . Memory loss   . Exogenous obesity   . Insulin dependent diabetes mellitus   . Peripheral neuropathy   . Venous insufficiency   . OSA on CPAP   . CAD (coronary artery disease)   . Hyperlipidemia   . Gout   . Stroke     L patietal with small scattered lacunar infarcts  . Left foot drop   . Hypertension   . History of nuclear stress test 07/2011    dipyridamole; fixed inferolateral defect, worse at stress than rest; no reversible ischemia; low risk scan     PAST SURGICAL HISTORY: Past Surgical History  Procedure Laterality Date  . Back surgery      lumbosacral  . Cholecystectomy    . Sinus endo w/fusion    . Coronary angioplasty  10/13/1996  . Rotator cuff repair Right   . Transthoracic echocardiogram  08/08/2013    EF 55-60%, mild conc hypertrophy, grade 1 diastolic dysfunction; AV with mild stenosis; LA & RA mildly dilated  . Coronary angioplasty  09/21/1989    emergency PTCA  . Coronary angioplasty  10/13/1996    Multi-Link diagonal & OD stenting (Dr. Marella Chimes)  . Coronary angioplasty  12/03/1997    disease of mid DX-1 ~50% & in mid PLA & PDA (distal lesions) (Dr. Marella Chimes)   . Coronary angioplasty  10/14/1999    progression of disease distal PLA & PDA; progression of disease prox RCA - moderate (Dr. Marella Chimes)   . Coronary angioplasty with stent placement  04/04/2004    4.0x37mm non-DES (thrombectomy via AngioJet) to RCA for high grade stenosis (Dr. Marella Chimes)  . Carotid doppler  03/2013    bilat bulb/prox ICAs - mild amount of fibrous plaque with no evidence of diameter reduction    FAMILY HISTORY: Family History  Problem Relation Age of Onset  . Coronary artery disease Father   . Heart disease Mother   . Cancer Maternal Grandmother   . Heart Problems Maternal Grandfather     SOCIAL  HISTORY: History   Social History  . Marital Status: Married    Spouse Name: N/A    Number of Children: 3  . Years of Education: 12   Occupational History  . Not on file.   Social History Main Topics  . Smoking status: Former Smoker -- 2.00 packs/day for 20 years    Types: Cigarettes    Quit date: 01/25/1973  . Smokeless tobacco: Never Used  . Alcohol Use: No  . Drug Use: No  . Sexual  Activity: Not on file   Other Topics Concern  . Not on file   Social History Narrative  . No narrative on file     PHYSICAL EXAM  Filed Vitals:   03/08/14 0808  BP: 115/56  Pulse: 59  Height: 5\' 9"  (1.753 m)  Weight: 263 lb (119.296 kg)   Body mass index is 38.82 kg/(m^2).  Generalized: Well developed, obese male in no acute distress  Head: normocephalic and atraumatic,. Oropharynx benign  Neck: Supple, no carotid bruits  Cardiac: Regular rate rhythm, no murmur  Musculoskeletal: Left foot drop AFO Neurological examination   Mentation: Alert oriented to time, place, history taking.MMSE 29/30. AFT 11.  Follows all commands speech and language fluent  Cranial nerve II-XII: Pupils were equal round reactive to light extraocular movements were full, visual field were full on confrontational test. Facial sensation and strength were normal. Hearing was intact to finger rubbing bilaterally. Uvula tongue midline. head turning and shoulder shrug were normal and symmetric.Tongue protrusion into cheek strength was normal. Motor: normal bulk and tone, full strength in the BUE, BLE, on the right , left lower extremity weakness with AFO in place and footdrop Sensory: Decreased pinprick and vibratory to last ankle, normal on the right   Coordination: finger-nose-finger, heel-to-shin normal on the right, unable to perform on the left  Reflexes: 1+ and symmetric except absent Achilles plantar responses were flexor bilaterally. Gait and Station: Rising up from seated position without assistance, wide  based stance,  moderate stride, ambulated a short distance with a single-point cane, no difficulty with turns. Tandem gait unsteady  DIAGNOSTIC DATA (LABS, IMAGING, TESTING)     ASSESSMENT AND PLAN  73 y.o. year old male  has a past medical history of Rhabdomyolysis; left parietal subcortical stroke, small vessel disease and Memory loss.here to followup .He is a patient of Dr. Krista Blue is out of the office today  Memory score is stable at 29/30.  Aricept 10mg  daily will refill Multivitamin daily F/U in 6 months Dennie Bible, [facility], [facility], APRN  [facility] [address] [place],  [address] [phone]

## 2014-03-08 NOTE — Patient Instructions (Signed)
Memory score is stable Aricept 10mg  daily will refill Multivitamin daily F/U in 6 months

## 2014-03-08 NOTE — Progress Notes (Signed)
I have read the note, and I agree with the clinical assessment and plan.  WILLIS,CHARLES KEITH   

## 2014-05-17 ENCOUNTER — Ambulatory Visit (INDEPENDENT_AMBULATORY_CARE_PROVIDER_SITE_OTHER): Payer: Medicare Other | Admitting: Internal Medicine

## 2014-05-17 ENCOUNTER — Encounter: Payer: Self-pay | Admitting: Internal Medicine

## 2014-05-17 VITALS — BP 164/68 | HR 56 | Ht 69.0 in | Wt 258.1 lb

## 2014-05-17 DIAGNOSIS — I2510 Atherosclerotic heart disease of native coronary artery without angina pectoris: Secondary | ICD-10-CM

## 2014-05-17 DIAGNOSIS — I4949 Other premature depolarization: Secondary | ICD-10-CM

## 2014-05-17 DIAGNOSIS — E785 Hyperlipidemia, unspecified: Secondary | ICD-10-CM

## 2014-05-17 DIAGNOSIS — E1042 Type 1 diabetes mellitus with diabetic polyneuropathy: Secondary | ICD-10-CM

## 2014-05-17 DIAGNOSIS — I359 Nonrheumatic aortic valve disorder, unspecified: Secondary | ICD-10-CM

## 2014-05-17 DIAGNOSIS — E1142 Type 2 diabetes mellitus with diabetic polyneuropathy: Secondary | ICD-10-CM

## 2014-05-17 DIAGNOSIS — I350 Nonrheumatic aortic (valve) stenosis: Secondary | ICD-10-CM

## 2014-05-17 DIAGNOSIS — E1049 Type 1 diabetes mellitus with other diabetic neurological complication: Secondary | ICD-10-CM

## 2014-05-17 DIAGNOSIS — I493 Ventricular premature depolarization: Secondary | ICD-10-CM

## 2014-05-17 DIAGNOSIS — E669 Obesity, unspecified: Secondary | ICD-10-CM

## 2014-05-17 DIAGNOSIS — R600 Localized edema: Secondary | ICD-10-CM

## 2014-05-17 DIAGNOSIS — R609 Edema, unspecified: Secondary | ICD-10-CM

## 2014-05-17 NOTE — Patient Instructions (Signed)
Your physician wants you to follow-up in:  6 months. You will receive a reminder letter in the mail two months in advance. If you don't receive a letter, please call our office to schedule the follow-up appointment.   

## 2014-05-21 ENCOUNTER — Encounter: Payer: Self-pay | Admitting: Internal Medicine

## 2014-05-21 DIAGNOSIS — R600 Localized edema: Secondary | ICD-10-CM | POA: Insufficient documentation

## 2014-05-21 HISTORY — DX: Localized edema: R60.0

## 2014-05-21 NOTE — Progress Notes (Signed)
OFFICE NOTE  Chief Complaint:  Some LE swelling  Primary Care Physician: Simona Huh, MD  HPI:  Randall Wilkins is a pleasant 73 year old male patient who was formerly followed by Dr. Rollene Fare. He is here to establish cardiac care with [place] today. He has a history of obesity, multiple comorbidities including insulin-dependent diabetes, diabetic peripheral neuropathy, diabetic nephropathy, venous insufficiency and peripheral neuropathy. He also has obstructive sleep apnea on CPAP and coronary artery disease. He unfortunately suffered a left parietal stroke with small lacunar infarcts noted on MRI in 2013. He is a left foot drop from an L4/L5 neuropathy and wears a brace for this. From a cardiac standpoint he had a large non-drug-eluting stent placed in the RCA in 2005. He has had a stent placed at 97 to a diagonal which was noted to be patent. He's had some problems with dizziness which is improved. He [place] as large to medium secondary to neuropathy and venous insufficiency. Unfortunately not been able to lose a lot of weight due to his difficulty in ambulating and painful neuropathy.  He denies any cardiac chest pain or worsening shortness of breath.  Randall Wilkins returns today for a six-month followup. He is having no complaints. He does report some lower extremity swelling which is stable.  PMHx:  Past Medical History  Diagnosis Date  . Rhabdomyolysis   . Memory loss   . Exogenous obesity   . Insulin dependent diabetes mellitus   . Peripheral neuropathy   . Venous insufficiency   . OSA on CPAP   . CAD (coronary artery disease)   . Hyperlipidemia   . Gout   . Stroke     L patietal with small scattered lacunar infarcts  . Left foot drop   . Hypertension   . History of nuclear stress test 07/2011    dipyridamole; fixed inferolateral defect, worse at stress than rest; no reversible ischemia; low risk scan     Past Surgical History  Procedure Laterality Date  . Back surgery     lumbosacral  . Cholecystectomy    . Sinus endo w/fusion    . Coronary angioplasty  10/13/1996  . Rotator cuff repair Right   . Transthoracic echocardiogram  08/08/2013    EF 55-60%, mild conc hypertrophy, grade 1 diastolic dysfunction; AV with mild stenosis; LA & RA mildly dilated  . Coronary angioplasty  09/21/1989    emergency PTCA  . Coronary angioplasty  10/13/1996    Multi-Link diagonal & OD stenting (Dr. Marella Chimes)  . Coronary angioplasty  12/03/1997    disease of mid DX-1 ~50% & in mid PLA & PDA (distal lesions) (Dr. Marella Chimes)   . Coronary angioplasty  10/14/1999    progression of disease distal PLA & PDA; progression of disease prox RCA - moderate (Dr. Marella Chimes)   . Coronary angioplasty with stent placement  04/04/2004    4.0x12mm non-DES (thrombectomy via AngioJet) to RCA for high grade stenosis (Dr. Marella Chimes)  . Carotid doppler  03/2013    bilat bulb/prox ICAs - mild amount of fibrous plaque with no evidence of diameter reduction    FAMHx:  Family History  Problem Relation Age of Onset  . Coronary artery disease Father   . Heart disease Mother   . Cancer Maternal Grandmother   . Heart Problems Maternal Grandfather     SOCHx:   reports that he quit smoking about 41 years ago. His smoking use included Cigarettes. He has a 40 pack-year smoking  history. He has never used smokeless tobacco. He reports that he does not drink alcohol or use illicit drugs.  ALLERGIES:  Allergies  Allergen Reactions  . Niaspan [Niacin Er] Itching and Rash    ROS: A comprehensive review of systems was negative except for: Cardiovascular: positive for lower extremity edema Neurological: positive for gait problems and neuropathy Endocrine: positive for diabetic neuropathy  HOME MEDS: Current Outpatient Prescriptions  Medication Sig Dispense Refill  . allopurinol (ZYLOPRIM) 100 MG tablet Take 300 mg by mouth daily.       Marland Kitchen amLODipine (NORVASC) 5 MG tablet Take 5 mg by mouth  daily.      Marland Kitchen aspirin EC 81 MG tablet Take 81 mg by mouth daily.      . clopidogrel (PLAVIX) 75 MG tablet Take 75 mg by mouth daily.      . colchicine 0.6 MG tablet Take 0.6 mg by mouth daily as needed.      . donepezil (ARICEPT) 10 MG tablet Take 1 tablet (10 mg total) by mouth daily.  30 tablet  6  . Empagliflozin (JARDIANCE) 10 MG TABS Take 10 mg by mouth.      . fish oil-omega-3 fatty acids 1000 MG capsule Take 2 g by mouth daily.       Marland Kitchen HYDROcodone-acetaminophen (VICODIN) 5-500 MG per tablet Take 1 tablet by mouth every 6 (six) hours as needed for pain.      Marland Kitchen insulin glargine (LANTUS) 100 UNIT/ML injection Inject 30 Units into the skin at bedtime.       . insulin lispro (HUMALOG) 100 UNIT/ML injection Inject into the skin 3 (three) times daily before meals.      Marland Kitchen lisinopril (PRINIVIL,ZESTRIL) 5 MG tablet Take 5 mg by mouth daily.      . metoprolol succinate (TOPROL-XL) 25 MG 24 hr tablet Take 25 mg by mouth daily.      . NON FORMULARY at bedtime. CPAP      . potassium chloride SA (K-DUR,KLOR-CON) 20 MEQ tablet Take 20 mEq by mouth 2 (two) times daily.      . rosuvastatin (CRESTOR) 20 MG tablet Take 20 mg by mouth daily.      . Tamsulosin HCl (FLOMAX) 0.4 MG CAPS Take by mouth.      . torsemide (DEMADEX) 10 MG tablet Take 50 mg by mouth daily.       No current facility-administered medications for this visit.    LABS/IMAGING: No results found for this or any previous visit (from the past 48 hour(s)). No results found.  VITALS: BP 164/68  Pulse 56  Ht 5\' 9"  (1.753 m)  Wt 258 lb 1.6 oz (117.073 kg)  BMI 38.10 kg/m2  EXAM: General appearance: alert and no distress Neck: no carotid bruit and no JVD Lungs: clear to auscultation bilaterally Heart: regular rate and rhythm and occasional missed beats Abdomen: obese, soft, non-tender, +BS Extremities: edema trace to 1+ bilaterally, brace on the left leg Pulses: 2+ and symmetric Skin: Skin color, texture, turgor normal. No rashes  or lesions Neurologic: Mental status: Alert, oriented, thought content appropriate Psych: Pleasant mood, affect  EKG: Sinus bradycardia with first degree AV block at 56  ASSESSMENT: 1. Coronary artery disease status post BMS to the RCA in 2005, and remote BMS to the diagonal and 1997 2. Obesity 3. Hypertension-controlled 4. Dyslipidemia 5. Prior stroke 6. Diabetic neuropathy 7. Diabetic nephropathy 8. Type 1 diabetes on insulin 9. LE edema  PLAN: 1.   Randall Wilkins is  doing fairly well despite multiple comorbidities. He had a stroke in the distant past but has relatively few deficits. Unfortunately he has some problems with memory loss. He does suffer from painful neuropathy and has a foot drop from the L4-L5 level. His diabetes is fairly well controlled by Dr. [name]. He has chronic leg edema, probably from neuropathy, which is stable. He also has dyslipidemia which is followed regularly. He denies any cardiac chest pain.  Unfortunately he is unable to lose weight due to his neuropathy. Plan to see him back in 6 months.  Pixie Casino, MD, [facility] Attending Cardiologist [place] 05/21/2014, 5:08 PM

## 2014-06-26 ENCOUNTER — Encounter (INDEPENDENT_AMBULATORY_CARE_PROVIDER_SITE_OTHER): Payer: Self-pay

## 2014-06-26 ENCOUNTER — Ambulatory Visit (INDEPENDENT_AMBULATORY_CARE_PROVIDER_SITE_OTHER): Payer: Medicare Other | Admitting: Neurology

## 2014-06-26 ENCOUNTER — Ambulatory Visit (INDEPENDENT_AMBULATORY_CARE_PROVIDER_SITE_OTHER): Payer: Self-pay

## 2014-06-26 DIAGNOSIS — G5603 Carpal tunnel syndrome, bilateral upper limbs: Secondary | ICD-10-CM

## 2014-06-26 DIAGNOSIS — Z0289 Encounter for other administrative examinations: Secondary | ICD-10-CM

## 2014-06-26 DIAGNOSIS — G5600 Carpal tunnel syndrome, unspecified upper limb: Secondary | ICD-10-CM

## 2014-06-26 NOTE — Procedures (Signed)
     HISTORY:  Randall Wilkins is a 73 year old gentleman with a ten-year history of diabetes. He reports a several year history of numbness in both hands, left greater than right. The symptoms have significantly worsened over the last 2-3 months. He is having increased numbness at night, difficulty sleeping, dropping things from his hands. He denies any neck discomfort. He is being evaluated for possible neuropathy or a cervical radiculopathy. He does have a history of a diabetic peripheral neuropathy.   NERVE CONDUCTION STUDIES:  Nerve conduction studies were performed on both upper extremities. The distal motor latency for the right median nerve was prolonged, with a low motor amplitude for this nerve. No response was seen for the left median nerve. The distal motor latencies and motor amplitudes for the ulnar nerves were normal bilaterally. The F wave latency for the right median nerve was prolonged, and was unobtainable on the left. Nerve conduction velocities for the right median nerve was normal. The F wave latencies for the ulnar nerves were slightly prolonged on the right, borderline normal on the left, with normal nerve conduction velocities seen for these nerves bilaterally. The sensory latencies for the median nerves were unobtainable bilaterally, normal for the ulnar nerves bilaterally.  EMG STUDIES:  EMG study was performed on the left upper extremity:  The first dorsal interosseous muscle reveals 2 to 4 K units with full recruitment. No fibrillations or positive waves were noted. The abductor pollicis brevis muscle reveals no voluntary units with no recruitment. 3+ fibrillations and positive waves were noted. The extensor indicis proprius muscle reveals 1 to 3 K units with full recruitment. No fibrillations or positive waves were noted. The pronator teres muscle reveals 2 to 3 K units with full recruitment. No fibrillations or positive waves were noted. The biceps muscle reveals 1 to 2  K units with full recruitment. No fibrillations or positive waves were noted. The triceps muscle reveals 2 to 4 K units with full recruitment. No fibrillations or positive waves were noted. The anterior deltoid muscle reveals 2 to 3 K units with full recruitment. No fibrillations or positive waves were noted. The cervical paraspinal muscles were tested at 2 levels. No abnormalities of insertional activity were seen at either level tested. There was good relaxation.  A limited EMG study was performed on the right upper extremity:  The first dorsal interosseous muscle reveals 2 to 5 K units with slightly decreased recruitment. No fibrillations or positive waves were noted. The abductor pollicis brevis muscle reveals 2 to 6 K units with decreased recruitment. No fibrillations or positive waves were noted. The extensor indicis proprius muscle reveals 1 to 3 K units with full recruitment. No fibrillations or positive waves were noted.    IMPRESSION:  Nerve conduction studies done on both upper extremities shows evidence of a moderate severity carpal tunnel syndrome on the right. On the left, there is evidence of an end-stage carpal tunnel syndrome. No evidence of a left cervical radiculopathy is seen. EMG evaluation of both arms shows findings consistent with carpal tunnel syndrome.  Jill Alexanders MD 06/26/2014 1:57 PM  [facility] [address] [place], [place] [address]  Phone [phone] Fax [phone]

## 2014-08-01 ENCOUNTER — Telehealth: Payer: Self-pay | Admitting: Internal Medicine

## 2014-08-01 NOTE — Telephone Encounter (Signed)
Randall Wilkins patient is having Bilateral carpel tunnel surgery. Please have Dr.Hilty address if he hasn't already.

## 2014-08-01 NOTE — Telephone Encounter (Signed)
Pt called in stating that he is having surgery in both hands on 08/27 and he would like to know if he should continue to take his plavix and aspirin or not. Please call  Thanks

## 2014-08-03 ENCOUNTER — Other Ambulatory Visit: Payer: Self-pay | Admitting: Orthopedic Surgery

## 2014-08-03 NOTE — Telephone Encounter (Signed)
Ok to stop aspirin 7 days prior to surgery and plavix 5 days prior to surgery. Resume after.  Dr. Lemmie Evens

## 2014-08-03 NOTE — Telephone Encounter (Signed)
LMTCB

## 2014-08-03 NOTE — Telephone Encounter (Signed)
LM for patient to return call about holding medications for surgery.

## 2014-08-06 ENCOUNTER — Encounter (HOSPITAL_BASED_OUTPATIENT_CLINIC_OR_DEPARTMENT_OTHER): Payer: Self-pay | Admitting: *Deleted

## 2014-08-06 NOTE — Progress Notes (Signed)
Saw dr Debara Pickett 6/15-all stress and echos good- Waiting to clear from him-stopping plavix-not asa Is having both ctr done Coming in for bmet Will bring cpap and uses every night

## 2014-08-06 NOTE — Telephone Encounter (Signed)
patient returned call. He had been out of town so was unavailable to communicate information to regarding holding medications. Advised him to hold asa 7 days and plavix for 5 days per Dr. Debara Pickett.   Also advised him to tell Dr. Fredna Dow that he just now got this message and he won't be able to hold the medications as long as Dr. Debara Pickett recommended as he was unable to be reached last week.

## 2014-08-07 ENCOUNTER — Other Ambulatory Visit: Payer: Medicare Other

## 2014-08-07 ENCOUNTER — Encounter (HOSPITAL_BASED_OUTPATIENT_CLINIC_OR_DEPARTMENT_OTHER)
Admission: RE | Admit: 2014-08-07 | Discharge: 2014-08-07 | Disposition: A | Discharge status: Home / Self Care | Payer: Medicare Other | Source: Ambulatory Visit | Attending: Orthopedic Surgery | Admitting: Orthopedic Surgery

## 2014-08-07 DIAGNOSIS — E119 Type 2 diabetes mellitus without complications: Secondary | ICD-10-CM | POA: Diagnosis not present

## 2014-08-07 DIAGNOSIS — Z8673 Personal history of transient ischemic attack (TIA), and cerebral infarction without residual deficits: Secondary | ICD-10-CM | POA: Diagnosis not present

## 2014-08-07 DIAGNOSIS — E785 Hyperlipidemia, unspecified: Secondary | ICD-10-CM | POA: Diagnosis not present

## 2014-08-07 DIAGNOSIS — M19049 Primary osteoarthritis, unspecified hand: Secondary | ICD-10-CM | POA: Diagnosis not present

## 2014-08-07 DIAGNOSIS — Z7902 Long term (current) use of antithrombotics/antiplatelets: Secondary | ICD-10-CM | POA: Diagnosis not present

## 2014-08-07 DIAGNOSIS — E669 Obesity, unspecified: Secondary | ICD-10-CM | POA: Diagnosis not present

## 2014-08-07 DIAGNOSIS — Z7982 Long term (current) use of aspirin: Secondary | ICD-10-CM | POA: Diagnosis not present

## 2014-08-07 DIAGNOSIS — G4733 Obstructive sleep apnea (adult) (pediatric): Secondary | ICD-10-CM | POA: Diagnosis not present

## 2014-08-07 DIAGNOSIS — Z6837 Body mass index (BMI) 37.0-37.9, adult: Secondary | ICD-10-CM | POA: Diagnosis not present

## 2014-08-07 DIAGNOSIS — Z87891 Personal history of nicotine dependence: Secondary | ICD-10-CM | POA: Diagnosis not present

## 2014-08-07 DIAGNOSIS — I2510 Atherosclerotic heart disease of native coronary artery without angina pectoris: Secondary | ICD-10-CM | POA: Diagnosis not present

## 2014-08-07 DIAGNOSIS — I10 Essential (primary) hypertension: Secondary | ICD-10-CM | POA: Diagnosis not present

## 2014-08-07 DIAGNOSIS — G609 Hereditary and idiopathic neuropathy, unspecified: Secondary | ICD-10-CM | POA: Diagnosis not present

## 2014-08-07 DIAGNOSIS — Z794 Long term (current) use of insulin: Secondary | ICD-10-CM | POA: Diagnosis not present

## 2014-08-07 DIAGNOSIS — M109 Gout, unspecified: Secondary | ICD-10-CM | POA: Diagnosis not present

## 2014-08-07 DIAGNOSIS — G5600 Carpal tunnel syndrome, unspecified upper limb: Secondary | ICD-10-CM | POA: Diagnosis present

## 2014-08-07 LAB — BASIC METABOLIC PANEL
Anion gap: 12 (ref 5–15)
BUN: 29 mg/dL — ABNORMAL HIGH (ref 6–23)
CO2: 29 mEq/L (ref 19–32)
Calcium: 9.5 mg/dL (ref 8.4–10.5)
Chloride: 100 mEq/L (ref 96–112)
Creatinine, Ser: 1.49 mg/dL — ABNORMAL HIGH (ref 0.50–1.35)
GFR calc Af Amer: 52 mL/min — ABNORMAL LOW (ref >90)
GFR calc non Af Amer: 45 mL/min — ABNORMAL LOW (ref >90)
Glucose, Bld: 238 mg/dL — ABNORMAL HIGH (ref 70–99)
Potassium: 4.6 mEq/L (ref 3.7–5.3)
Sodium: 141 mEq/L (ref 137–147)

## 2014-08-09 ENCOUNTER — Encounter (HOSPITAL_BASED_OUTPATIENT_CLINIC_OR_DEPARTMENT_OTHER): Payer: Medicare Other | Admitting: Certified Registered"

## 2014-08-09 ENCOUNTER — Ambulatory Visit (HOSPITAL_BASED_OUTPATIENT_CLINIC_OR_DEPARTMENT_OTHER)
Admission: RE | Admit: 2014-08-09 | Discharge: 2014-08-09 | Disposition: A | Discharge status: Home / Self Care | Payer: Medicare Other | Source: Ambulatory Visit | Attending: Orthopedic Surgery | Admitting: Orthopedic Surgery

## 2014-08-09 ENCOUNTER — Encounter (HOSPITAL_BASED_OUTPATIENT_CLINIC_OR_DEPARTMENT_OTHER): Payer: Self-pay | Admitting: Certified Registered"

## 2014-08-09 ENCOUNTER — Ambulatory Visit (HOSPITAL_BASED_OUTPATIENT_CLINIC_OR_DEPARTMENT_OTHER): Payer: Medicare Other | Admitting: Certified Registered"

## 2014-08-09 ENCOUNTER — Encounter (HOSPITAL_BASED_OUTPATIENT_CLINIC_OR_DEPARTMENT_OTHER)
Admission: RE | Disposition: A | Discharge status: Home / Self Care | Payer: Self-pay | Source: Ambulatory Visit | Attending: Orthopedic Surgery

## 2014-08-09 DIAGNOSIS — Z7982 Long term (current) use of aspirin: Secondary | ICD-10-CM | POA: Insufficient documentation

## 2014-08-09 DIAGNOSIS — E119 Type 2 diabetes mellitus without complications: Secondary | ICD-10-CM | POA: Diagnosis not present

## 2014-08-09 DIAGNOSIS — G5600 Carpal tunnel syndrome, unspecified upper limb: Secondary | ICD-10-CM | POA: Diagnosis not present

## 2014-08-09 DIAGNOSIS — Z794 Long term (current) use of insulin: Secondary | ICD-10-CM | POA: Insufficient documentation

## 2014-08-09 DIAGNOSIS — I10 Essential (primary) hypertension: Secondary | ICD-10-CM | POA: Insufficient documentation

## 2014-08-09 DIAGNOSIS — Z87891 Personal history of nicotine dependence: Secondary | ICD-10-CM | POA: Insufficient documentation

## 2014-08-09 DIAGNOSIS — Z7902 Long term (current) use of antithrombotics/antiplatelets: Secondary | ICD-10-CM | POA: Insufficient documentation

## 2014-08-09 DIAGNOSIS — G609 Hereditary and idiopathic neuropathy, unspecified: Secondary | ICD-10-CM | POA: Diagnosis not present

## 2014-08-09 DIAGNOSIS — E785 Hyperlipidemia, unspecified: Secondary | ICD-10-CM | POA: Insufficient documentation

## 2014-08-09 DIAGNOSIS — E669 Obesity, unspecified: Secondary | ICD-10-CM | POA: Insufficient documentation

## 2014-08-09 DIAGNOSIS — M109 Gout, unspecified: Secondary | ICD-10-CM | POA: Insufficient documentation

## 2014-08-09 DIAGNOSIS — Z8673 Personal history of transient ischemic attack (TIA), and cerebral infarction without residual deficits: Secondary | ICD-10-CM | POA: Insufficient documentation

## 2014-08-09 DIAGNOSIS — I2510 Atherosclerotic heart disease of native coronary artery without angina pectoris: Secondary | ICD-10-CM | POA: Insufficient documentation

## 2014-08-09 DIAGNOSIS — Z6837 Body mass index (BMI) 37.0-37.9, adult: Secondary | ICD-10-CM | POA: Insufficient documentation

## 2014-08-09 DIAGNOSIS — G4733 Obstructive sleep apnea (adult) (pediatric): Secondary | ICD-10-CM | POA: Insufficient documentation

## 2014-08-09 DIAGNOSIS — M19049 Primary osteoarthritis, unspecified hand: Secondary | ICD-10-CM | POA: Insufficient documentation

## 2014-08-09 HISTORY — PX: CARPAL TUNNEL RELEASE: SHX101

## 2014-08-09 LAB — GLUCOSE, CAPILLARY
Glucose-Capillary: 119 mg/dL — ABNORMAL HIGH (ref 70–99)
Glucose-Capillary: 138 mg/dL — ABNORMAL HIGH (ref 70–99)

## 2014-08-09 SURGERY — CARPAL TUNNEL RELEASE
Anesthesia: General | Site: Wrist | Laterality: Bilateral

## 2014-08-09 MED ORDER — CEFAZOLIN SODIUM-DEXTROSE 2-3 GM-% IV SOLR: 2.0000 g | INTRAVENOUS | Status: DC

## 2014-08-09 MED ORDER — LIDOCAINE HCL (CARDIAC) 20 MG/ML IV SOLN
INTRAVENOUS | Status: DC | PRN
  Administered 2014-08-09: 60 mg via INTRAVENOUS

## 2014-08-09 MED ORDER — PROPOFOL 10 MG/ML IV BOLUS
INTRAVENOUS | Status: AC
  Filled 2014-08-09: qty 20

## 2014-08-09 MED ORDER — FENTANYL CITRATE 0.05 MG/ML IJ SOLN
INTRAMUSCULAR | Status: AC
  Filled 2014-08-09: qty 4

## 2014-08-09 MED ORDER — FENTANYL CITRATE 0.05 MG/ML IJ SOLN: 25.0000 ug | INTRAMUSCULAR | Status: DC | PRN

## 2014-08-09 MED ORDER — OXYCODONE HCL 5 MG PO TABS: 5.0000 mg | ORAL_TABLET | Freq: Once | ORAL | Status: DC | PRN

## 2014-08-09 MED ORDER — DEXAMETHASONE SODIUM PHOSPHATE 10 MG/ML IJ SOLN
INTRAMUSCULAR | Status: DC | PRN
  Administered 2014-08-09: 4 mg via INTRAVENOUS

## 2014-08-09 MED ORDER — ONDANSETRON HCL 4 MG/2ML IJ SOLN
INTRAMUSCULAR | Status: DC | PRN
  Administered 2014-08-09: 4 mg via INTRAVENOUS

## 2014-08-09 MED ORDER — EPHEDRINE SULFATE 50 MG/ML IJ SOLN
INTRAMUSCULAR | Status: DC | PRN
  Administered 2014-08-09 (×2): 10 mg via INTRAVENOUS
  Administered 2014-08-09: 5 mg via INTRAVENOUS
  Administered 2014-08-09: 10 mg via INTRAVENOUS

## 2014-08-09 MED ORDER — HYDROCODONE-ACETAMINOPHEN 10-325 MG PO TABS: 1.0000 | ORAL_TABLET | Freq: Four times a day (QID) | ORAL | Status: DC | PRN

## 2014-08-09 MED ORDER — LACTATED RINGERS IV SOLN
INTRAVENOUS | Status: DC
  Administered 2014-08-09: 11:00:00 via INTRAVENOUS

## 2014-08-09 MED ORDER — BUPIVACAINE HCL (PF) 0.25 % IJ SOLN
INTRAMUSCULAR | Status: DC | PRN
  Administered 2014-08-09: 12 mL

## 2014-08-09 MED ORDER — OXYCODONE HCL 5 MG/5ML PO SOLN: 5.0000 mg | Freq: Once | ORAL | Status: DC | PRN

## 2014-08-09 MED ORDER — CHLORHEXIDINE GLUCONATE 4 % EX LIQD: 60.0000 mL | Freq: Once | CUTANEOUS | Status: DC

## 2014-08-09 MED ORDER — FENTANYL CITRATE 0.05 MG/ML IJ SOLN
INTRAMUSCULAR | Status: DC | PRN
  Administered 2014-08-09: 50 ug via INTRAVENOUS

## 2014-08-09 MED ORDER — MIDAZOLAM HCL 2 MG/2ML IJ SOLN: 1.0000 mg | INTRAMUSCULAR | Status: DC | PRN

## 2014-08-09 MED ORDER — ONDANSETRON HCL 4 MG/2ML IJ SOLN: 4.0000 mg | Freq: Four times a day (QID) | INTRAMUSCULAR | Status: DC | PRN

## 2014-08-09 MED ORDER — MIDAZOLAM HCL 2 MG/2ML IJ SOLN
INTRAMUSCULAR | Status: AC
  Filled 2014-08-09: qty 2

## 2014-08-09 MED ORDER — CEFAZOLIN SODIUM-DEXTROSE 2-3 GM-% IV SOLR
2.0000 g | INTRAVENOUS | Status: AC
  Administered 2014-08-09: 2 g via INTRAVENOUS

## 2014-08-09 MED ORDER — FENTANYL CITRATE 0.05 MG/ML IJ SOLN: 50.0000 ug | INTRAMUSCULAR | Status: DC | PRN

## 2014-08-09 MED ORDER — CEFAZOLIN SODIUM-DEXTROSE 2-3 GM-% IV SOLR
INTRAVENOUS | Status: AC
  Filled 2014-08-09: qty 50

## 2014-08-09 MED ORDER — PROPOFOL 10 MG/ML IV BOLUS
INTRAVENOUS | Status: DC | PRN
  Administered 2014-08-09: 150 mg via INTRAVENOUS

## 2014-08-09 SURGICAL SUPPLY — 41 items
BLADE SURG 15 STRL LF DISP TIS (BLADE) ×1 IMPLANT
BLADE SURG 15 STRL SS (BLADE) ×1
BNDG COHESIVE 3X5 TAN STRL LF (GAUZE/BANDAGES/DRESSINGS) ×4 IMPLANT
BNDG ESMARK 4X9 LF (GAUZE/BANDAGES/DRESSINGS) ×2 IMPLANT
BNDG GAUZE ELAST 4 BULKY (GAUZE/BANDAGES/DRESSINGS) ×4 IMPLANT
CHLORAPREP W/TINT 26ML (MISCELLANEOUS) ×4 IMPLANT
CORDS BIPOLAR (ELECTRODE) ×2 IMPLANT
COVER MAYO STAND STRL (DRAPES) ×4 IMPLANT
COVER TABLE BACK 60X90 (DRAPES) ×2 IMPLANT
CUFF TOURNIQUET SINGLE 18IN (TOURNIQUET CUFF) ×4 IMPLANT
DERMABOND ADVANCED (GAUZE/BANDAGES/DRESSINGS) ×2
DERMABOND ADVANCED .7 DNX12 (GAUZE/BANDAGES/DRESSINGS) ×2 IMPLANT
DRAPE EXTREMITY T 121X128X90 (DRAPE) ×4 IMPLANT
DRAPE SURG 17X23 STRL (DRAPES) ×4 IMPLANT
DRSG PAD ABDOMINAL 8X10 ST (GAUZE/BANDAGES/DRESSINGS) ×2 IMPLANT
GAUZE SPONGE 4X4 12PLY STRL (GAUZE/BANDAGES/DRESSINGS) ×2 IMPLANT
GAUZE XEROFORM 1X8 LF (GAUZE/BANDAGES/DRESSINGS) ×2 IMPLANT
GLOVE BIOGEL PI IND STRL 7.0 (GLOVE) ×1 IMPLANT
GLOVE BIOGEL PI IND STRL 8.5 (GLOVE) ×1 IMPLANT
GLOVE BIOGEL PI INDICATOR 7.0 (GLOVE) ×1
GLOVE BIOGEL PI INDICATOR 8.5 (GLOVE) ×1
GLOVE ECLIPSE 6.5 STRL STRAW (GLOVE) ×2 IMPLANT
GLOVE SURG ORTHO 8.0 STRL STRW (GLOVE) ×2 IMPLANT
GOWN STRL REUS W/ TWL LRG LVL3 (GOWN DISPOSABLE) ×1 IMPLANT
GOWN STRL REUS W/TWL LRG LVL3 (GOWN DISPOSABLE) ×1
GOWN STRL REUS W/TWL XL LVL3 (GOWN DISPOSABLE) ×2 IMPLANT
NEEDLE 27GAX1X1/2 (NEEDLE) ×2 IMPLANT
NS IRRIG 1000ML POUR BTL (IV SOLUTION) ×2 IMPLANT
PACK BASIN DAY SURGERY FS (CUSTOM PROCEDURE TRAY) ×2 IMPLANT
PAD ALCOHOL SWAB (MISCELLANEOUS) ×4 IMPLANT
PAD CAST 3X4 CTTN HI CHSV (CAST SUPPLIES) IMPLANT
PADDING CAST ABS 4INX4YD NS (CAST SUPPLIES)
PADDING CAST ABS COTTON 4X4 ST (CAST SUPPLIES) IMPLANT
PADDING CAST COTTON 3X4 STRL (CAST SUPPLIES)
STOCKINETTE 4X48 STRL (DRAPES) ×4 IMPLANT
SUT VICRYL 4-0 PS2 18IN ABS (SUTURE) IMPLANT
SUT VICRYL RAPIDE 4/0 PS 2 (SUTURE) ×4 IMPLANT
SYR BULB 3OZ (MISCELLANEOUS) ×2 IMPLANT
SYR CONTROL 10ML LL (SYRINGE) ×2 IMPLANT
TOWEL OR 17X24 6PK STRL BLUE (TOWEL DISPOSABLE) ×2 IMPLANT
UNDERPAD 30X30 INCONTINENT (UNDERPADS AND DIAPERS) IMPLANT

## 2014-08-09 NOTE — Anesthesia Procedure Notes (Signed)
Procedure Name: LMA Insertion Date/Time: 08/09/2014 10:58 AM Performed by: Baxter Flattery Pre-anesthesia Checklist: Patient identified, Emergency Drugs available, Suction available and Patient being monitored Patient Re-evaluated:Patient Re-evaluated prior to inductionOxygen Delivery Method: Circle System Utilized Preoxygenation: Pre-oxygenation with 100% oxygen Intubation Type: IV induction Ventilation: Mask ventilation without difficulty LMA: LMA inserted LMA Size: 5.0 Number of attempts: 1 Airway Equipment and Method: bite block Placement Confirmation: positive ETCO2 and breath sounds checked- equal and bilateral Tube secured with: Tape Dental Injury: Teeth and Oropharynx as per pre-operative assessment

## 2014-08-09 NOTE — Anesthesia Preprocedure Evaluation (Signed)
Anesthesia Evaluation  Patient identified by MRN, date of birth, ID band Patient awake    Reviewed: Allergy & Precautions, H&P , NPO status , Patient's Chart, lab work & pertinent test results  Airway Mallampati: II  Neck ROM: full    Dental   Pulmonary sleep apnea , former smoker,          Cardiovascular hypertension, + CAD, + Cardiac Stents and + Peripheral Vascular Disease     Neuro/Psych  Neuromuscular disease CVA, Residual Symptoms    GI/Hepatic   Endo/Other  diabetes, Type 2obese  Renal/GU      Musculoskeletal   Abdominal   Peds  Hematology   Anesthesia Other Findings   Reproductive/Obstetrics                           Anesthesia Physical Anesthesia Plan  ASA: III  Anesthesia Plan: MAC and Bier Block   Post-op Pain Management:    Induction: Intravenous  Airway Management Planned: Simple Face Mask  Additional Equipment:   Intra-op Plan:   Post-operative Plan:   Informed Consent: I have reviewed the patients History and Physical, chart, labs and discussed the procedure including the risks, benefits and alternatives for the proposed anesthesia with the patient or authorized representative who has indicated his/her understanding and acceptance.     Plan Discussed with: CRNA, Anesthesiologist and Surgeon  Anesthesia Plan Comments:         Anesthesia Quick Evaluation

## 2014-08-09 NOTE — H&P (Signed)
[name] is a 73 year-old right-hand dominant male complaining of bilateral hand numbness and tingling.  He has had nerve conductions done revealing severe carpal tunnel syndrome bilaterally with no response to the nerve on his left side. He complains of constant numbness and tingling on both sides.  This has been going on for the past three to four months.  He has history of diabetes, peripheral neuropathy, he has had neck fusion by Dr. Ellene Route.  He has no history of injury to the hand.  He is awakened 7 out of 7 nights.  He has history of diabetes, arthritis and gout.  No history of thyroid problems.  He complains of a constant, severe aching type pain with numbness and tingling.   Dr. Debara Pickett takes care of his Plavix.  ALLERGIES:       None. MEDICATIONS:      Amitriptyline, K-Dur, Crestor, Metoprolol, amlodipine, Lisinopril, Flomax, Plavix,      torsemide, allopurinol, hydrocodone, donepezil, coated aspirin, Nerve Renew,  fish      oil, cinnamon, Humalog and Lantus. SURGICAL HISTORY:     Rhabdomyolysis, cholecystectomy, tonsillectomy and stents placed. FAMILY MEDICAL HISTORY:    Positive for heart disease, high blood pressure and arthritis. SOCIAL HISTORY:      He does not smoke or drink.   REVIEW OF SYSTEMS:      Positive for weight loss, glasses, heart attack, stroke, sleep disorder, easy bleeding,      bruising, otherwise negative.  Randall Wilkins is an 73 y.o. male.   Chief Complaint: Bilateral Carpal Tunnel Syndrome HPI: see above  Past Medical History  Diagnosis Date  . Rhabdomyolysis   . Memory loss   . Exogenous obesity   . Insulin dependent diabetes mellitus   . Peripheral neuropathy   . Venous insufficiency   . CAD (coronary artery disease)   . Hyperlipidemia   . Gout   . Stroke     L patietal with small scattered lacunar infarcts  . Left foot drop   . Hypertension   . History of nuclear stress test 07/2011    dipyridamole; fixed inferolateral defect, worse at stress than  rest; no reversible ischemia; low risk scan   . OSA on CPAP     uses a cpap    Past Surgical History  Procedure Laterality Date  . Cholecystectomy    . Sinus endo w/fusion    . Coronary angioplasty  10/13/1996  . Rotator cuff repair Right   . Transthoracic echocardiogram  08/08/2013    EF 55-60%, mild conc hypertrophy, grade 1 diastolic dysfunction; AV with mild stenosis; LA & RA mildly dilated  . Coronary angioplasty  09/21/1989    emergency PTCA  . Coronary angioplasty  10/13/1996    Multi-Link diagonal & OD stenting (Dr. Marella Chimes)  . Coronary angioplasty  12/03/1997    disease of mid DX-1 ~50% & in mid PLA & PDA (distal lesions) (Dr. Marella Chimes)   . Coronary angioplasty  10/14/1999    progression of disease distal PLA & PDA; progression of disease prox RCA - moderate (Dr. Marella Chimes)   . Coronary angioplasty with stent placement  04/04/2004    4.0x69mm non-DES (thrombectomy via AngioJet) to RCA for high grade stenosis (Dr. Marella Chimes)  . Carotid doppler  03/2013    bilat bulb/prox ICAs - mild amount of fibrous plaque with no evidence of diameter reduction  . Back surgery  2002    lumbosacral  . Tonsillectomy    .  Colonoscopy      Family History  Problem Relation Age of Onset  . Coronary artery disease Father   . Heart disease Mother   . Cancer Maternal Grandmother   . Heart Problems Maternal Grandfather    Social History:  reports that he quit smoking about 41 years ago. His smoking use included Cigarettes. He has a 40 pack-year smoking history. He has never used smokeless tobacco. He reports that he does not drink alcohol or use illicit drugs.  Allergies:  Allergies  Allergen Reactions  . Niaspan [Niacin Er] Itching and Rash    Medications Prior to Admission  Medication Sig Dispense Refill  . amitriptyline (ELAVIL) 50 MG tablet Take 50 mg by mouth at bedtime.      Marland Kitchen allopurinol (ZYLOPRIM) 100 MG tablet Take 300 mg by mouth daily.       Marland Kitchen amLODipine  (NORVASC) 5 MG tablet Take 5 mg by mouth daily.      Marland Kitchen aspirin EC 81 MG tablet Take 81 mg by mouth daily.      . clopidogrel (PLAVIX) 75 MG tablet Take 75 mg by mouth daily.      . colchicine 0.6 MG tablet Take 0.6 mg by mouth daily as needed.      . donepezil (ARICEPT) 10 MG tablet Take 1 tablet (10 mg total) by mouth daily.  30 tablet  6  . fish oil-omega-3 fatty acids 1000 MG capsule Take 2 g by mouth daily.       Marland Kitchen HYDROcodone-acetaminophen (VICODIN) 5-500 MG per tablet Take 1 tablet by mouth every 6 (six) hours as needed for pain.      Marland Kitchen insulin glargine (LANTUS) 100 UNIT/ML injection Inject 30 Units into the skin at bedtime.       . insulin lispro (HUMALOG) 100 UNIT/ML injection Inject into the skin 3 (three) times daily before meals.      Marland Kitchen lisinopril (PRINIVIL,ZESTRIL) 5 MG tablet Take 5 mg by mouth daily.      . metoprolol succinate (TOPROL-XL) 25 MG 24 hr tablet Take 25 mg by mouth daily.      . NON FORMULARY at bedtime. CPAP      . potassium chloride SA (K-DUR,KLOR-CON) 20 MEQ tablet Take 20 mEq by mouth 2 (two) times daily.      . rosuvastatin (CRESTOR) 20 MG tablet Take 20 mg by mouth daily.      . Tamsulosin HCl (FLOMAX) 0.4 MG CAPS Take by mouth.      . torsemide (DEMADEX) 10 MG tablet Take 50 mg by mouth daily.        No results found for this or any previous visit (from the past 48 hour(s)).  No results found.   Pertinent items are noted in HPI.  Height 5\' 9"  (1.753 m), weight 116.121 kg (256 lb).  General appearance: cooperative and appears stated age Head: Normocephalic, without obvious abnormality, atraumatic Neck: no JVD Resp: clear to auscultation bilaterally Cardio: regular rate and rhythm, S1, S2 normal, no murmur, click, rub or gallop GI: soft, non-tender; bowel sounds normal; no masses,  no organomegaly Extremities: extremities normal, atraumatic, no cyanosis or edema Pulses: 2+ and symmetric Skin: Skin color, texture, turgor normal. No rashes or  lesions Neurologic: Grossly normal Incision/Wound: na  Assessment/Plan RADIOGRAPHS:    X-rays of his hand reveal mild degenerative changes,  STT and DIP joints, otherwise negative.   DIAGNOSIS:      Asymptomatic degenerative arthritis with bilateral carpal tunnel syndrome.   RECOMMENDATIONS/PLAN:  We have discussed the possibility of surgical release, he is seen with his wife, they are aware there is no guarantee with the surgery, possibility of infection, recurrence, injury to arteries, nerves, tendons, incomplete relief of symptoms and dystrophy, that he does have peripheral neuropathy, that he is unlikely to have return to normal, that we are going to attempt to halt the process.  Hopefully, he can see some improvement, but there is no guarantee of that.    He isscheduled as an outpatient for bilateral carpal tunnel release, he would like to have both sides done at the same time.    Charliegh Vasudevan R 08/09/2014, 9:30 AM

## 2014-08-09 NOTE — Transfer of Care (Signed)
Immediate Anesthesia Transfer of Care Note  Patient: Randall Wilkins  Procedure(s) Performed: Procedure(s) with comments: BILATERAL CARPAL TUNNEL RELEASE (Bilateral) - ANESTHESIA:  IV REGIONAL BIL FAB  Patient Location: PACU  Anesthesia Type:General  Level of Consciousness: awake, alert  and oriented  Airway & Oxygen Therapy: Patient Spontanous Breathing and Patient connected to face mask oxygen  Post-op Assessment: Report given to PACU RN, Post -op Vital signs reviewed and stable and Patient moving all extremities  Post vital signs: Reviewed and stable  Complications: No apparent anesthesia complications

## 2014-08-09 NOTE — Brief Op Note (Signed)
08/09/2014  12:01 PM  PATIENT:  Randall Wilkins  73 y.o. male  PRE-OPERATIVE DIAGNOSIS:  BILATERAL CARPAL TUNNEL SYNDROME  POST-OPERATIVE DIAGNOSIS:  BILATERAL CARPAL TUNNEL SYNDROME  PROCEDURE:  Procedure(s) with comments: BILATERAL CARPAL TUNNEL RELEASE (Bilateral) - ANESTHESIA:  IV REGIONAL BIL FAB  SURGEON:  Surgeon(s) and Role:    * Daryll Brod, MD - Primary  PHYSICIAN ASSISTANT:   ASSISTANTS: none   ANESTHESIA:   local and general  EBL:  Total I/O In: 850 [I.V.:850] Out: -   BLOOD ADMINISTERED:none  DRAINS: none   LOCAL MEDICATIONS USED:  BUPIVICAINE   SPECIMEN:  No Specimen  DISPOSITION OF SPECIMEN:  N/A  COUNTS:  YES  TOURNIQUET:   Total Tourniquet Time Documented: Forearm (Right) - 25 minutes Forearm (Right) - 20 minutes Total: Forearm (Right) - 45 minutes   DICTATION: .Other Dictation: Dictation Number [id number]  PLAN OF CARE: Discharge to home after PACU  PATIENT DISPOSITION:  PACU - hemodynamically stable.

## 2014-08-09 NOTE — Discharge Instructions (Addendum)

## 2014-08-09 NOTE — Op Note (Signed)
Dictation Number [id number]

## 2014-08-09 NOTE — Anesthesia Postprocedure Evaluation (Signed)
Anesthesia Post Note  Patient: Randall Wilkins  Procedure(s) Performed: Procedure(s) (LRB): BILATERAL CARPAL TUNNEL RELEASE (Bilateral)  Anesthesia type: General  Patient location: PACU  Post pain: Pain level controlled and Adequate analgesia  Post assessment: Post-op Vital signs reviewed, Patient's Cardiovascular Status Stable, Respiratory Function Stable, Patent Airway and Pain level controlled  Last Vitals:  Filed Vitals:   08/09/14 1346  BP: 110/52  Pulse: 58  Temp: 36.6 C  Resp: 18    Post vital signs: Reviewed and stable  Level of consciousness: awake, alert  and oriented  Complications: No apparent anesthesia complications

## 2014-08-09 NOTE — Op Note (Signed)
NAME:  Randall Wilkins, Randall Wilkins                ACCOUNT NO.:  [id number]  MEDICAL RECORD NO.:  [id number]  LOCATION:                                 FACILITY:  PHYSICIAN:  Daryll Brod, M.D.            DATE OF BIRTH:  DATE OF PROCEDURE:  08/09/2014 DATE OF DISCHARGE:                              OPERATIVE REPORT   PREOPERATIVE DIAGNOSIS:  Bilateral carpal tunnel syndrome.  POSTOPERATIVE DIAGNOSIS:  Bilateral carpal tunnel syndrome.  OPERATION:  Decompression median nerve, left hand.  SURGEON:  Daryll Brod, MD  ANESTHESIA:  General with local infiltration.  ANESTHESIOLOGIST:  Albertha Ghee, MD  HISTORY:  The patient is a 73 year old male with a history of bilateral carpal tunnel syndrome.  Nerve conduction is positive.  This has not responded to conservative treatment.  He has elected to undergo surgical decompression.  Pre, peri, and postoperative course have been discussed along with risks and complications.  He is aware that there is no guarantee with the surgery, possibility of infection, recurrence of injury to arteries, nerves, tendons, incomplete relief of symptoms and dystrophy.  In the preoperative area, the patient is seen, the extremity marked by both patient and surgeon, antibiotic given.  PROCEDURE IN DETAIL:  The patient was brought to the operating room where general anesthetic was carried out without difficulty by Dr. Marcie Bal.  He was prepped using ChloraPrep, supine position, right arm free.  A 3-minute dry time was allowed.  Time-out taken confirming the patient and procedure.  A longitudinal incision was made in the right palm, carried down through subcutaneous tissue.  Bleeders were electrocauterized.  Palmar fascia was split.  Superficial palmar arch identified.  The flexor tendon in the ring and little finger identified. To the ulnar side of median nerve, the carpal retinaculum was incised with sharp dissection.  Right angle and Sewall retractor were placed between  skin and forearm fascia.  The fascia released for approximately 2 cm proximal to the wrist crease under direct vision.  Canal was explored.  Area of compression to the nerve was apparent.  Motor branch entered in the muscle.  The wound was irrigated and closed with a subcuticular 4-0 Vicryl Rapide.  Dermabond was applied.  A local infiltration with 0.25% Marcaine without epinephrine was given, approximately 5 mL was used.  The tourniquet was deflated after placement of a compressive dressing with the fingers free.  All fingers pinked.  He was prepped on the left side with ChloraPrep.  Time-out again taken confirmed patient and procedure.  The limb was exsanguinated with an Esmarch bandage.  Tourniquet placed on forearm, was inflated to 250 mmHg as was done on the right side.  A longitudinal incision was made in the right palm, carried down through subcutaneous tissue. Bleeders again electrocauterized with bipolar.  Palmar fascia was split. Superficial palmar arch was identified.  I traced through Guyon's canal. The carpal retinaculum was incised on its lateral medial side protecting the Guyon's canal and median nerve.  A right angle and Sewall retractor were placed between the forearm fascia and skin and the fascia released for approximately 2 cm proximal to the wrist crease under  direct vision. Canal was explored.  Area of compression again was apparent. No further lesions were identified.  The wound was irrigated.  The skin closed with a subcuticular 4-0 Vicryl.  A local infiltration with 0.25% Marcaine without epinephrine was given. Dermabond applied.  On deflation of the tourniquet after placement of a compressive dressing with the fingers free was applied.  All fingers pinked.  He was taken to the recovery room for observation in satisfactory condition.  He will be discharged home to return to the [facility] in 1 week on Vicodin.           ______________________________ Daryll Brod, M.D.     GK/MEDQ  D:  08/09/2014  T:  08/09/2014  Job:  [id number]

## 2014-08-10 ENCOUNTER — Encounter (HOSPITAL_BASED_OUTPATIENT_CLINIC_OR_DEPARTMENT_OTHER): Payer: Self-pay | Admitting: Orthopedic Surgery

## 2014-08-16 ENCOUNTER — Encounter: Payer: Self-pay | Admitting: Internal Medicine

## 2014-09-10 ENCOUNTER — Encounter (INDEPENDENT_AMBULATORY_CARE_PROVIDER_SITE_OTHER): Payer: Self-pay

## 2014-09-10 ENCOUNTER — Encounter: Payer: Self-pay | Admitting: Neurology

## 2014-09-10 ENCOUNTER — Ambulatory Visit (INDEPENDENT_AMBULATORY_CARE_PROVIDER_SITE_OTHER): Payer: Medicare Other | Admitting: Neurology

## 2014-09-10 VITALS — BP 146/50 | HR 60 | Ht 69.0 in | Wt 258.0 lb

## 2014-09-10 DIAGNOSIS — E1029 Type 1 diabetes mellitus with other diabetic kidney complication: Secondary | ICD-10-CM

## 2014-09-10 DIAGNOSIS — R609 Edema, unspecified: Secondary | ICD-10-CM

## 2014-09-10 DIAGNOSIS — E1021 Type 1 diabetes mellitus with diabetic nephropathy: Secondary | ICD-10-CM

## 2014-09-10 DIAGNOSIS — R413 Other amnesia: Secondary | ICD-10-CM

## 2014-09-10 DIAGNOSIS — G589 Mononeuropathy, unspecified: Secondary | ICD-10-CM

## 2014-09-10 DIAGNOSIS — G629 Polyneuropathy, unspecified: Secondary | ICD-10-CM

## 2014-09-10 DIAGNOSIS — M6282 Rhabdomyolysis: Secondary | ICD-10-CM

## 2014-09-10 DIAGNOSIS — N058 Unspecified nephritic syndrome with other morphologic changes: Secondary | ICD-10-CM

## 2014-09-10 DIAGNOSIS — R600 Localized edema: Secondary | ICD-10-CM

## 2014-09-10 MED ORDER — DONEPEZIL HCL 10 MG PO TABS: 10.0000 mg | ORAL_TABLET | Freq: Every day | ORAL | Status: DC

## 2014-09-10 NOTE — Progress Notes (Signed)
[facility]  PATIENT: Randall Wilkins DOB: 10-10-1941   REASON FOR VISIT: follow up for memory loss and history of stroke  HISTORY OF PRESENT ILLNESS He had a past medical history of hypertension, insulin-dependent diabetes, hyperlipidemia, coronary artery disease, diabetic peripheral neuropathy,  He had 12 years of education, was disabled in his early 61, he worked as a Therapist, art, because of back injury, multiple low back surgery, he also had a history of rhabdomyolysis around 2009, ventilation, hemodialysis dependent for 2 weeks, following a fall accident, also with persistent left ankle drop, left leg, foot numbness.  At baseline he ambulates with a left ankle brace, still active at home, manage his own finances, driving without difficulty, was able to fix home electricity such as lawn mow without difficulty, over the past 6 months, he became forgetful, he tends to repeat himself, forgot name,   He had MRI at [place], there is evidence of old left parietal subcortical stroke, mild small vessel disease,  echocardiogram July 2013 showed ejection fraction 55%,RVSP of 36, mild aortic valvular stenosis,  BUN and creatinine 23/1 point 38, LDL 45, triglyceride 104,   He has not had further stroke or TIA symtoms. He is currently on Plavix and aspirin with moderate bruising.  He is also taking Aricept, tolerating medication well, he has no recurrent strokelike symptoms, he complains of bilateral hand, and feet paresthesia, previously tried and failed different neuropathic pain medications, this including Neurontin, Lyrica, Cymbalta,  He was put on amitriptyline around March 2015, 50 mg twice a day, tolerating it well, it does help him moderately, but he still complains of dense numbness of his bilateral fingertips, and toes,  REVIEW OF SYSTEMS: Full 14 system review of systems performed and notable only for those listed, all others are neg:  Eye  discharge, bruise easily, walking difficulty, neck stiffness, daytime sleepiness, apnea, neck swelling, trouble swallowing, memory loss   ALLERGIES: Allergies  Allergen Reactions  . Niaspan [Niacin Er] Itching and Rash    HOME MEDICATIONS: Outpatient Prescriptions Prior to Visit  Medication Sig Dispense Refill  . allopurinol (ZYLOPRIM) 100 MG tablet Take 300 mg by mouth daily.       Marland Kitchen amitriptyline (ELAVIL) 50 MG tablet Take 50 mg by mouth at bedtime.      Marland Kitchen amLODipine (NORVASC) 5 MG tablet Take 5 mg by mouth daily.      Marland Kitchen aspirin EC 81 MG tablet Take 81 mg by mouth daily.      . clopidogrel (PLAVIX) 75 MG tablet Take 75 mg by mouth daily.      . colchicine 0.6 MG tablet Take 0.6 mg by mouth daily as needed.      . donepezil (ARICEPT) 10 MG tablet Take 1 tablet (10 mg total) by mouth daily.  30 tablet  6  . fish oil-omega-3 fatty acids 1000 MG capsule Take 2 g by mouth daily.       Marland Kitchen HYDROcodone-acetaminophen (NORCO) 10-325 MG per tablet Take 1 tablet by mouth every 6 (six) hours as needed.  30 tablet  0  . HYDROcodone-acetaminophen (VICODIN) 5-500 MG per tablet Take 1 tablet by mouth every 6 (six) hours as needed for pain.      Marland Kitchen insulin glargine (LANTUS) 100 UNIT/ML injection Inject 30 Units into the skin at bedtime.       . insulin lispro (HUMALOG) 100 UNIT/ML injection Inject into the skin 3 (three) times daily before meals.      Marland Kitchen lisinopril (  PRINIVIL,ZESTRIL) 5 MG tablet Take 5 mg by mouth daily.      . metoprolol succinate (TOPROL-XL) 25 MG 24 hr tablet Take 25 mg by mouth daily.      . NON FORMULARY at bedtime. CPAP      . potassium chloride SA (K-DUR,KLOR-CON) 20 MEQ tablet Take 20 mEq by mouth 2 (two) times daily.      . rosuvastatin (CRESTOR) 20 MG tablet Take 20 mg by mouth daily.      . Tamsulosin HCl (FLOMAX) 0.4 MG CAPS Take by mouth.      . torsemide (DEMADEX) 10 MG tablet Take 50 mg by mouth daily.       No facility-administered medications prior to visit.    PAST  MEDICAL HISTORY: Past Medical History  Diagnosis Date  . Rhabdomyolysis   . Memory loss   . Exogenous obesity   . Insulin dependent diabetes mellitus   . Peripheral neuropathy   . Venous insufficiency   . CAD (coronary artery disease)   . Hyperlipidemia   . Gout   . Stroke     L patietal with small scattered lacunar infarcts  . Left foot drop   . Hypertension   . History of nuclear stress test 07/2011    dipyridamole; fixed inferolateral defect, worse at stress than rest; no reversible ischemia; low risk scan   . OSA on CPAP     uses a cpap    PAST SURGICAL HISTORY: Past Surgical History  Procedure Laterality Date  . Cholecystectomy    . Sinus endo w/fusion    . Coronary angioplasty  10/13/1996  . Rotator cuff repair Right   . Transthoracic echocardiogram  08/08/2013    EF 55-60%, mild conc hypertrophy, grade 1 diastolic dysfunction; AV with mild stenosis; LA & RA mildly dilated  . Coronary angioplasty  09/21/1989    emergency PTCA  . Coronary angioplasty  10/13/1996    Multi-Link diagonal & OD stenting (Dr. Marella Chimes)  . Coronary angioplasty  12/03/1997    disease of mid DX-1 ~50% & in mid PLA & PDA (distal lesions) (Dr. Marella Chimes)   . Coronary angioplasty  10/14/1999    progression of disease distal PLA & PDA; progression of disease prox RCA - moderate (Dr. Marella Chimes)   . Coronary angioplasty with stent placement  04/04/2004    4.0x64mm non-DES (thrombectomy via AngioJet) to RCA for high grade stenosis (Dr. Marella Chimes)  . Carotid doppler  03/2013    bilat bulb/prox ICAs - mild amount of fibrous plaque with no evidence of diameter reduction  . Back surgery  2002    lumbosacral  . Tonsillectomy    . Colonoscopy    . Carpal tunnel release Bilateral 08/09/2014    Procedure: BILATERAL CARPAL TUNNEL RELEASE;  Surgeon: Daryll Brod, MD;  Location: [place];  Service: Orthopedics;  Laterality: Bilateral;  ANESTHESIA:  IV REGIONAL BIL FAB    FAMILY  HISTORY: Family History  Problem Relation Age of Onset  . Coronary artery disease Father   . Heart disease Mother   . Cancer Maternal Grandmother   . Heart Problems Maternal Grandfather     SOCIAL HISTORY: History   Social History  . Marital Status: Married    Spouse Name: N/A    Number of Children: 3  . Years of Education: 12   Occupational History  . Not on file.   Social History Main Topics  . Smoking status: Former Smoker -- 2.00 packs/day for  20 years    Types: Cigarettes    Quit date: 01/25/1973  . Smokeless tobacco: Never Used  . Alcohol Use: No  . Drug Use: No  . Sexual Activity: Not on file   Other Topics Concern  . Not on file   Social History Narrative  . No narrative on file     PHYSICAL EXAM  Filed Vitals:   09/10/14 1129  BP: 146/50  Pulse: 60  Height: 5\' 9"  (1.753 m)  Weight: 258 lb (117.028 kg)   Body mass index is 38.08 kg/(m^2).  Generalized: Well developed, obese male in no acute distress  Head: normocephalic and atraumatic,. Oropharynx benign  Neck: Supple, no carotid bruits  Cardiac: Regular rate rhythm, no murmur  Musculoskeletal: Left foot drop AFO Neurological examination   Mentation: Alert oriented to time, place, history taking.MMSE 28/30. Missed 2/3 recalls Cranial nerve II-XII: Pupils were equal round reactive to light extraocular movements were full, visual field were full on confrontational test. Facial sensation and strength were normal. Hearing was intact to finger rubbing bilaterally. Uvula tongue midline. head turning and shoulder shrug were normal and symmetric.Tongue protrusion into cheek strength was normal. Motor: normal bulk and tone, full strength in the BUE, BLE, on the right , left lower extremity weakness with AFO in place and footdrop  Sensory: Decreased pinprick and vibratory at both legs to midshin level, decreased pinprick at finger tips Coordination: finger-nose-finger, heel-to-shin normal on the right, unable  to perform on the left  Reflexes: 1+ and symmetric except absent Achilles plantar responses were flexor bilaterally. Gait and Station: left foot drop with left AFO, cautious, mildly unsteady  ASSESSMENT AND PLAN  73 y.o. year old male  has a past medical history of rhabdomyolysis, with persistent left foot drop, wear left AFO, baseline gait difficulty,; left parietal subcortical stroke, small vessel disease and Memory loss. he continued to complain bilateral hands and feet neuropathic pain, numbness, which has improved with amitriptyline 50 mg twice a day,  1. Refill his Aricept 2. Encouraged moderate exercise 3. If he continued to complains of bilateral hands and feet paresthesia, may consider other neuropathic pain medications such as Trileptal, or higher dose of tricyclic     [facility] [address] [place], [place] [address] [phone]

## 2014-09-11 DIAGNOSIS — G629 Polyneuropathy, unspecified: Secondary | ICD-10-CM | POA: Insufficient documentation

## 2014-10-31 ENCOUNTER — Encounter: Payer: Self-pay | Admitting: Neurology

## 2014-11-06 ENCOUNTER — Encounter: Payer: Self-pay | Admitting: Neurology

## 2014-11-14 ENCOUNTER — Ambulatory Visit (INDEPENDENT_AMBULATORY_CARE_PROVIDER_SITE_OTHER): Payer: Medicare Other | Admitting: Internal Medicine

## 2014-11-14 ENCOUNTER — Encounter: Payer: Self-pay | Admitting: Internal Medicine

## 2014-11-14 VITALS — BP 142/60 | HR 58 | Ht 69.0 in | Wt 257.1 lb

## 2014-11-14 DIAGNOSIS — I350 Nonrheumatic aortic (valve) stenosis: Secondary | ICD-10-CM

## 2014-11-14 DIAGNOSIS — I2510 Atherosclerotic heart disease of native coronary artery without angina pectoris: Secondary | ICD-10-CM

## 2014-11-14 DIAGNOSIS — I493 Ventricular premature depolarization: Secondary | ICD-10-CM

## 2014-11-14 DIAGNOSIS — I2583 Coronary atherosclerosis due to lipid rich plaque: Secondary | ICD-10-CM

## 2014-11-14 DIAGNOSIS — E1021 Type 1 diabetes mellitus with diabetic nephropathy: Secondary | ICD-10-CM

## 2014-11-14 NOTE — Patient Instructions (Signed)
Your physician wants you to follow-up in: 6 Months You will receive a reminder letter in the mail two months in advance. If you don't receive a letter, please call our office to schedule the follow-up appointment.  

## 2014-11-14 NOTE — Progress Notes (Signed)
OFFICE NOTE  Chief Complaint:  Some LE swelling  Primary Care Physician: Simona Huh, MD  HPI:  Randall Wilkins is a pleasant 73 year old male patient who was formerly followed by Dr. Rollene Fare. He is here to establish cardiac care with [place] today. He has a history of obesity, multiple comorbidities including insulin-dependent diabetes, diabetic peripheral neuropathy, diabetic nephropathy, venous insufficiency and peripheral neuropathy. He also has obstructive sleep apnea on CPAP and coronary artery disease. He unfortunately suffered a left parietal stroke with small lacunar infarcts noted on MRI in 2013. He is a left foot drop from an L4/L5 neuropathy and wears a brace for this. From a cardiac standpoint he had a large non-drug-eluting stent placed in the RCA in 2005. He has had a stent placed at 89 to a diagonal which was noted to be patent. He's had some problems with dizziness which is improved. He [place] as large to medium secondary to neuropathy and venous insufficiency. Unfortunately not been able to lose a lot of weight due to his difficulty in ambulating and painful neuropathy.  He denies any cardiac chest pain or worsening shortness of breath.  [name] for a six-month followup. He is having no complaints. He does report some lower extremity swelling which is stable. He has mild aortic stenosis which is been stable as well. His last echo was in August 2014. He denies any chest pain or worsening shortness of breath. He recently had lab work to Dr. Eugenio Hoes office which we will obtain.  PMHx:  Past Medical History  Diagnosis Date  . Rhabdomyolysis   . Memory loss   . Exogenous obesity   . Insulin dependent diabetes mellitus   . Peripheral neuropathy   . Venous insufficiency   . CAD (coronary artery disease)   . Hyperlipidemia   . Gout   . Stroke     L patietal with small scattered lacunar infarcts  . Left foot drop   . Hypertension   . History of nuclear stress  test 07/2011    dipyridamole; fixed inferolateral defect, worse at stress than rest; no reversible ischemia; low risk scan   . OSA on CPAP     uses a cpap    Past Surgical History  Procedure Laterality Date  . Cholecystectomy    . Sinus endo w/fusion    . Coronary angioplasty  10/13/1996  . Rotator cuff repair Right   . Transthoracic echocardiogram  08/08/2013    EF 55-60%, mild conc hypertrophy, grade 1 diastolic dysfunction; AV with mild stenosis; LA & RA mildly dilated  . Coronary angioplasty  09/21/1989    emergency PTCA  . Coronary angioplasty  10/13/1996    Multi-Link diagonal & OD stenting (Dr. Marella Chimes)  . Coronary angioplasty  12/03/1997    disease of mid DX-1 ~50% & in mid PLA & PDA (distal lesions) (Dr. Marella Chimes)   . Coronary angioplasty  10/14/1999    progression of disease distal PLA & PDA; progression of disease prox RCA - moderate (Dr. Marella Chimes)   . Coronary angioplasty with stent placement  04/04/2004    4.0x71mm non-DES (thrombectomy via AngioJet) to RCA for high grade stenosis (Dr. Marella Chimes)  . Carotid doppler  03/2013    bilat bulb/prox ICAs - mild amount of fibrous plaque with no evidence of diameter reduction  . Back surgery  2002    lumbosacral  . Tonsillectomy    . Colonoscopy    . Carpal tunnel release Bilateral  08/09/2014    Procedure: BILATERAL CARPAL TUNNEL RELEASE;  Surgeon: Daryll Brod, MD;  Location: [place];  Service: Orthopedics;  Laterality: Bilateral;  ANESTHESIA:  IV REGIONAL BIL FAB    FAMHx:  Family History  Problem Relation Age of Onset  . Coronary artery disease Father   . Heart disease Mother   . Cancer Maternal Grandmother   . Heart Problems Maternal Grandfather     SOCHx:   reports that he quit smoking about 41 years ago. His smoking use included Cigarettes. He has a 40 pack-year smoking history. He has never used smokeless tobacco. He reports that he does not drink alcohol or use illicit  drugs.  ALLERGIES:  Allergies  Allergen Reactions  . Niaspan [Niacin Er] Itching and Rash    ROS: A comprehensive review of systems was negative except for: Cardiovascular: positive for lower extremity edema Neurological: positive for gait problems and neuropathy Endocrine: positive for diabetic neuropathy  HOME MEDS: Current Outpatient Prescriptions  Medication Sig Dispense Refill  . allopurinol (ZYLOPRIM) 100 MG tablet Take 300 mg by mouth daily.     Marland Kitchen amitriptyline (ELAVIL) 50 MG tablet Take 50 mg by mouth at bedtime.    Marland Kitchen amLODipine (NORVASC) 5 MG tablet Take 5 mg by mouth daily.    Marland Kitchen aspirin EC 81 MG tablet Take 81 mg by mouth daily.    . clopidogrel (PLAVIX) 75 MG tablet Take 75 mg by mouth daily.    . colchicine 0.6 MG tablet Take 0.6 mg by mouth daily as needed.    . donepezil (ARICEPT) 10 MG tablet Take 1 tablet (10 mg total) by mouth daily. 90 tablet 3  . fish oil-omega-3 fatty acids 1000 MG capsule Take 2 g by mouth daily.     Marland Kitchen HYDROcodone-acetaminophen (VICODIN) 5-500 MG per tablet Take 1 tablet by mouth every 6 (six) hours as needed for pain.    Marland Kitchen insulin glargine (LANTUS) 100 UNIT/ML injection Inject 30 Units into the skin at bedtime.     . insulin lispro (HUMALOG) 100 UNIT/ML injection Inject into the skin 3 (three) times daily before meals.    Marland Kitchen lisinopril (PRINIVIL,ZESTRIL) 5 MG tablet Take 5 mg by mouth daily.    . metoprolol succinate (TOPROL-XL) 25 MG 24 hr tablet Take 25 mg by mouth daily.    . NON FORMULARY at bedtime. CPAP    . potassium chloride SA (K-DUR,KLOR-CON) 20 MEQ tablet Take 20 mEq by mouth 2 (two) times daily.    . rosuvastatin (CRESTOR) 20 MG tablet Take 20 mg by mouth daily.    . Tamsulosin HCl (FLOMAX) 0.4 MG CAPS Take by mouth.    . torsemide (DEMADEX) 10 MG tablet Take 50 mg by mouth daily.     No current facility-administered medications for this visit.    LABS/IMAGING: No results found for this or any previous visit (from the past 48  hour(s)). No results found.  VITALS: BP 142/60 mmHg  Pulse 58  Ht 5\' 9"  (1.753 m)  Wt 257 lb 1.6 oz (116.62 kg)  BMI 37.95 kg/m2  EXAM: General appearance: alert and no distress Neck: no carotid bruit and no JVD Lungs: clear to auscultation bilaterally Heart: regular rate and rhythm, S1, S2 normal, systolic murmur: 3/6 early to mid-peaking systolic murmur 3/6, crescendo at 2nd right intercostal space and occasional missed beats Abdomen: obese, soft, non-tender, +BS Extremities: edema trace to 1+ bilaterally, brace on the left leg Pulses: 2+ and symmetric Skin: Skin color, texture,  turgor normal. No rashes or lesions Neurologic: Mental status: Alert, oriented, thought content appropriate Psych: Pleasant mood, affect  EKG: Sinus bradycardia with first degree AV block at 58, PAC  ASSESSMENT: 1. Coronary artery disease status post BMS to the RCA in 2005, and remote BMS to the diagonal and 1997 2. Obesity 3. Hypertension-controlled 4. Dyslipidemia 5. Prior stroke 6. Diabetic neuropathy 7. Diabetic nephropathy 8. Type 1 diabetes on insulin 9. LE edema 10. Mild aortic stenosis  PLAN: 1.   [name] is doing fairly well despite multiple comorbidities. He had a stroke in the distant past but has relatively few deficits. Unfortunately he has some problems with memory loss. He does suffer from painful neuropathy and has a foot drop from the L4-L5 level. His diabetes is fairly well controlled by Dr. [name], as well as his lipid management.. He has chronic leg edema, probably from neuropathy, which is stable. He also has dyslipidemia which is followed regularly. He denies any cardiac chest pain. His aortic stenosis murmur is stable. He will need repeat echocardiography in the next 1-2 years. Unfortunately he is unable to lose weight due to his neuropathy. Plan to see him back in 6 months.  Pixie Casino, MD, [facility] Attending Cardiologist [facility]  HILTY,Kenneth C 11/14/2014,  8:58 AM

## 2014-11-19 ENCOUNTER — Encounter: Payer: Self-pay | Admitting: Internal Medicine

## 2014-12-11 ENCOUNTER — Ambulatory Visit (INDEPENDENT_AMBULATORY_CARE_PROVIDER_SITE_OTHER): Payer: Medicare Other | Admitting: Neurology

## 2014-12-11 ENCOUNTER — Encounter: Payer: Self-pay | Admitting: Neurology

## 2014-12-11 VITALS — Ht 69.0 in | Wt 260.0 lb

## 2014-12-11 DIAGNOSIS — I2510 Atherosclerotic heart disease of native coronary artery without angina pectoris: Secondary | ICD-10-CM

## 2014-12-11 DIAGNOSIS — I2583 Coronary atherosclerosis due to lipid rich plaque: Principal | ICD-10-CM

## 2014-12-11 NOTE — Progress Notes (Signed)
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  At baseline he ambulates with a left ankle brace, still active at home, manage his own finances, driving without difficulty, was able to fix home electricity such as lawn mow without difficulty, over the past 6 months, he became forgetful, he tends to repeat himself, forgot name,   He had MRI at [facility], there is evidence of old left parietal subcortical stroke, mild small vessel disease,  echocardiogram July 2013 showed ejection fraction 55%,RVSP of 36, mild aortic valvular stenosis,  BUN and creatinine 23/1 point 38, LDL 45, triglyceride 104,   He has not had further stroke or TIA symtoms. He is currently on Plavix and aspirin with moderate bruising.  He is also taking Aricept, tolerating medication well, he has no recurrent strokelike symptoms, he complains of bilateral hand, and feet paresthesia, previously tried and failed different neuropathic pain medications, this including Neurontin, Lyrica, Cymbalta,  He was put on amitriptyline around March 2015, 50 mg twice a day, tolerating it well, it does help him moderately, but he still complains of dense numbness of his bilateral fingertips, and toes,  UPDATE 12/11/2014: He is overall doing very well, tolerating current medications, amitriptyline 50 mg every night has helped his sleep, he  continued to ambulate with left ankle brace   REVIEW OF SYSTEMS: Full 14 system review of systems performed and notable only for those listed, all others are neg:  Walking difficulty   ALLERGIES: Allergies  Allergen Reactions  . Niaspan [Niacin Er] Itching and Rash    HOME MEDICATIONS: Outpatient Prescriptions Prior to Visit  Medication Sig Dispense Refill  . allopurinol (ZYLOPRIM) 100 MG tablet Take 300 mg by mouth daily.     Marland Kitchen amitriptyline (ELAVIL) 50 MG tablet Take 50 mg by mouth at bedtime.    Marland Kitchen amLODipine (NORVASC) 5 MG tablet Take 5 mg by mouth daily.    Marland Kitchen aspirin EC 81 MG tablet Take 81 mg by mouth daily.    . clopidogrel (PLAVIX) 75 MG tablet Take 75 mg by mouth daily.    . colchicine 0.6 MG tablet Take 0.6 mg by mouth daily as needed.    . donepezil (ARICEPT) 10 MG tablet Take 1 tablet (10 mg total) by mouth daily. 90 tablet 3  . fish oil-omega-3 fatty acids 1000 MG capsule Take 2 g by mouth daily.     Marland Kitchen HYDROcodone-acetaminophen (VICODIN) 5-500 MG per tablet Take 1 tablet by mouth every 6 (six) hours as needed for pain.    Marland Kitchen insulin glargine (LANTUS) 100 UNIT/ML injection Inject 30 Units into the skin at bedtime.     . insulin lispro (HUMALOG) 100 UNIT/ML injection Inject into the skin 3 (three) times daily before meals.    Marland Kitchen lisinopril (PRINIVIL,ZESTRIL) 5 MG tablet Take 5 mg by mouth daily.    . metoprolol succinate (TOPROL-XL) 25 MG 24 hr tablet Take 25 mg by mouth daily.    Marland Kitchen  NON FORMULARY at bedtime. CPAP    . potassium chloride SA (K-DUR,KLOR-CON) 20 MEQ tablet Take 20 mEq by mouth 2 (two) times daily.    . rosuvastatin (CRESTOR) 20 MG tablet Take 20 mg by mouth daily.    . Tamsulosin HCl (FLOMAX) 0.4 MG CAPS Take by mouth.    . torsemide (DEMADEX) 10 MG tablet Take 50 mg by mouth daily.     No facility-administered medications prior to visit.    PAST MEDICAL HISTORY: Past Medical History  Diagnosis Date  . Rhabdomyolysis   . Memory loss   . Exogenous  obesity   . Insulin dependent diabetes mellitus   . Peripheral neuropathy   . Venous insufficiency   . CAD (coronary artery disease)   . Hyperlipidemia   . Gout   . Stroke     L patietal with small scattered lacunar infarcts  . Left foot drop   . Hypertension   . History of nuclear stress test 07/2011    dipyridamole; fixed inferolateral defect, worse at stress than rest; no reversible ischemia; low risk scan   . OSA on CPAP     uses a cpap    PAST SURGICAL HISTORY: Past Surgical History  Procedure Laterality Date  . Cholecystectomy    . Sinus endo w/fusion    . Coronary angioplasty  10/13/1996  . Rotator cuff repair Right   . Transthoracic echocardiogram  08/08/2013    EF 55-60%, mild conc hypertrophy, grade 1 diastolic dysfunction; AV with mild stenosis; LA & RA mildly dilated  . Coronary angioplasty  09/21/1989    emergency PTCA  . Coronary angioplasty  10/13/1996    Multi-Link diagonal & OD stenting (Dr. Marella Chimes)  . Coronary angioplasty  12/03/1997    disease of mid DX-1 ~50% & in mid PLA & PDA (distal lesions) (Dr. Marella Chimes)   . Coronary angioplasty  10/14/1999    progression of disease distal PLA & PDA; progression of disease prox RCA - moderate (Dr. Marella Chimes)   . Coronary angioplasty with stent placement  04/04/2004    4.0x7mm non-DES (thrombectomy via AngioJet) to RCA for high grade stenosis (Dr. Marella Chimes)  . Carotid doppler  03/2013    bilat bulb/prox ICAs - mild amount of fibrous plaque with no evidence of diameter reduction  . Back surgery  2002    lumbosacral  . Tonsillectomy    . Colonoscopy    . Carpal tunnel release Bilateral 08/09/2014    Procedure: BILATERAL CARPAL TUNNEL RELEASE;  Surgeon: Daryll Brod, MD;  Location: [facility];  Service: Orthopedics;  Laterality: Bilateral;  ANESTHESIA:  IV REGIONAL BIL FAB    FAMILY HISTORY: Family History  Problem Relation Age of Onset  . Coronary artery disease Father   . Heart disease  Mother   . Cancer Maternal Grandmother   . Heart Problems Maternal Grandfather     SOCIAL HISTORY: History   Social History  . Marital Status: Married    Spouse Name: N/A    Number of Children: 3  . Years of Education: 12   Occupational History  . Not on file.   Social History Main Topics  . Smoking status: Former Smoker -- 2.00 packs/day for 20 years    Types: Cigarettes    Quit date: 01/25/1973  . Smokeless tobacco: Never Used  . Alcohol Use: No  . Drug Use: No  . Sexual Activity: Not on file   Other Topics Concern  . Not on file  Social History Narrative     PHYSICAL EXAM  Filed Vitals:   12/11/14 1118  Height: 5\' 9"  (1.753 m)  Weight: 260 lb (117.935 kg)   Body mass index is 38.38 kg/(m^2).  Generalized: Well developed, obese male in no acute distress  Head: normocephalic and atraumatic,. Oropharynx benign  Neck: Supple, no carotid bruits  Cardiac: Regular rate rhythm, no murmur  Musculoskeletal: Left foot drop AFO Neurological examination   Mentation: Alert oriented to time, place, history taking.MMSE 28/30. Missed 2/3 recalls Cranial nerve II-XII: Pupils were equal round reactive to light extraocular movements were full, visual field were full on confrontational test. Facial sensation and strength were normal. Hearing was intact to finger rubbing bilaterally. Uvula tongue midline. head turning and shoulder shrug were normal and symmetric.Tongue protrusion into cheek strength was normal. Motor: normal bulk and tone, full strength in the BUE, BLE, on the right , left lower extremity weakness with AFO in place and footdrop  Sensory: Decreased pinprick and vibratory at both legs to midshin level, decreased pinprick at finger tips Coordination: finger-nose-finger, heel-to-shin normal on the right, unable to perform on the left  Reflexes: 1+ and symmetric except absent Achilles plantar responses were flexor bilaterally. Gait and Station: left foot drop with  left AFO, cautious, mildly unsteady  ASSESSMENT AND PLAN  73 y.o. year old male  has a past medical history of rhabdomyolysis, with persistent left foot drop, wear left AFO, baseline gait difficulty,; left parietal subcortical stroke, small vessel disease and Memory loss. he continued to complain bilateral hands and feet neuropathic pain, numbness, which has improved with amitriptyline 50 mg twice a day,  1. Refill his Aricept 2. Encouraged moderate exercise 3. Return to clinic in one year  Marcial Pacas, M.D. Ph.D.  [facility] [place], [place] [address] Phone: [phone] Fax:      [phone]

## 2015-02-21 ENCOUNTER — Other Ambulatory Visit: Payer: Self-pay | Admitting: Family Medicine

## 2015-02-21 DIAGNOSIS — R1310 Dysphagia, unspecified: Secondary | ICD-10-CM

## 2015-02-26 ENCOUNTER — Other Ambulatory Visit: Payer: Self-pay

## 2015-02-27 ENCOUNTER — Ambulatory Visit
Admission: RE | Admit: 2015-02-27 | Discharge: 2015-02-27 | Disposition: A | Discharge status: Home / Self Care | Payer: Medicare Other | Source: Ambulatory Visit | Attending: Family Medicine | Admitting: Family Medicine

## 2015-02-27 DIAGNOSIS — R1310 Dysphagia, unspecified: Secondary | ICD-10-CM

## 2015-03-21 ENCOUNTER — Inpatient Hospital Stay (HOSPITAL_COMMUNITY)
Admission: EM | Admit: 2015-03-21 | Discharge: 2015-04-03 | DRG: 981 | Disposition: A | Discharge status: Home / Self Care | Payer: Medicare Other | Attending: Thoracic Surgery (Cardiothoracic Vascular Surgery) | Admitting: Thoracic Surgery (Cardiothoracic Vascular Surgery)

## 2015-03-21 ENCOUNTER — Encounter (HOSPITAL_COMMUNITY): Payer: Self-pay | Admitting: Emergency Medicine

## 2015-03-21 ENCOUNTER — Inpatient Hospital Stay (HOSPITAL_COMMUNITY): Payer: Medicare Other

## 2015-03-21 DIAGNOSIS — N3000 Acute cystitis without hematuria: Secondary | ICD-10-CM | POA: Diagnosis not present

## 2015-03-21 DIAGNOSIS — E860 Dehydration: Secondary | ICD-10-CM | POA: Diagnosis present

## 2015-03-21 DIAGNOSIS — R1310 Dysphagia, unspecified: Secondary | ICD-10-CM

## 2015-03-21 DIAGNOSIS — J810 Acute pulmonary edema: Secondary | ICD-10-CM | POA: Diagnosis not present

## 2015-03-21 DIAGNOSIS — Z7902 Long term (current) use of antithrombotics/antiplatelets: Secondary | ICD-10-CM | POA: Diagnosis not present

## 2015-03-21 DIAGNOSIS — Y95 Nosocomial condition: Secondary | ICD-10-CM | POA: Diagnosis not present

## 2015-03-21 DIAGNOSIS — D696 Thrombocytopenia, unspecified: Secondary | ICD-10-CM | POA: Diagnosis present

## 2015-03-21 DIAGNOSIS — R0600 Dyspnea, unspecified: Secondary | ICD-10-CM | POA: Insufficient documentation

## 2015-03-21 DIAGNOSIS — I2582 Chronic total occlusion of coronary artery: Secondary | ICD-10-CM | POA: Diagnosis present

## 2015-03-21 DIAGNOSIS — M109 Gout, unspecified: Secondary | ICD-10-CM | POA: Diagnosis present

## 2015-03-21 DIAGNOSIS — J9611 Chronic respiratory failure with hypoxia: Secondary | ICD-10-CM | POA: Diagnosis not present

## 2015-03-21 DIAGNOSIS — Z951 Presence of aortocoronary bypass graft: Secondary | ICD-10-CM

## 2015-03-21 DIAGNOSIS — D62 Acute posthemorrhagic anemia: Secondary | ICD-10-CM | POA: Diagnosis not present

## 2015-03-21 DIAGNOSIS — N390 Urinary tract infection, site not specified: Secondary | ICD-10-CM | POA: Diagnosis present

## 2015-03-21 DIAGNOSIS — Z7982 Long term (current) use of aspirin: Secondary | ICD-10-CM

## 2015-03-21 DIAGNOSIS — I519 Heart disease, unspecified: Secondary | ICD-10-CM | POA: Diagnosis present

## 2015-03-21 DIAGNOSIS — Z8673 Personal history of transient ischemic attack (TIA), and cerebral infarction without residual deficits: Secondary | ICD-10-CM

## 2015-03-21 DIAGNOSIS — E876 Hypokalemia: Secondary | ICD-10-CM | POA: Diagnosis not present

## 2015-03-21 DIAGNOSIS — I872 Venous insufficiency (chronic) (peripheral): Secondary | ICD-10-CM | POA: Diagnosis present

## 2015-03-21 DIAGNOSIS — A419 Sepsis, unspecified organism: Secondary | ICD-10-CM | POA: Diagnosis not present

## 2015-03-21 DIAGNOSIS — R600 Localized edema: Secondary | ICD-10-CM | POA: Diagnosis present

## 2015-03-21 DIAGNOSIS — Z955 Presence of coronary angioplasty implant and graft: Secondary | ICD-10-CM | POA: Diagnosis not present

## 2015-03-21 DIAGNOSIS — E785 Hyperlipidemia, unspecified: Secondary | ICD-10-CM | POA: Diagnosis present

## 2015-03-21 DIAGNOSIS — E139 Other specified diabetes mellitus without complications: Secondary | ICD-10-CM | POA: Diagnosis present

## 2015-03-21 DIAGNOSIS — R197 Diarrhea, unspecified: Secondary | ICD-10-CM | POA: Insufficient documentation

## 2015-03-21 DIAGNOSIS — K224 Dyskinesia of esophagus: Secondary | ICD-10-CM | POA: Diagnosis present

## 2015-03-21 DIAGNOSIS — R0902 Hypoxemia: Secondary | ICD-10-CM

## 2015-03-21 DIAGNOSIS — Z1623 Resistance to quinolones and fluoroquinolones: Secondary | ICD-10-CM | POA: Diagnosis present

## 2015-03-21 DIAGNOSIS — G3184 Mild cognitive impairment, so stated: Secondary | ICD-10-CM | POA: Diagnosis present

## 2015-03-21 DIAGNOSIS — J9811 Atelectasis: Secondary | ICD-10-CM | POA: Diagnosis not present

## 2015-03-21 DIAGNOSIS — K2210 Ulcer of esophagus without bleeding: Secondary | ICD-10-CM | POA: Diagnosis present

## 2015-03-21 DIAGNOSIS — K921 Melena: Secondary | ICD-10-CM | POA: Diagnosis not present

## 2015-03-21 DIAGNOSIS — I350 Nonrheumatic aortic (valve) stenosis: Secondary | ICD-10-CM

## 2015-03-21 DIAGNOSIS — E669 Obesity, unspecified: Secondary | ICD-10-CM | POA: Diagnosis present

## 2015-03-21 DIAGNOSIS — D649 Anemia, unspecified: Secondary | ICD-10-CM | POA: Diagnosis not present

## 2015-03-21 DIAGNOSIS — J9601 Acute respiratory failure with hypoxia: Secondary | ICD-10-CM | POA: Diagnosis not present

## 2015-03-21 DIAGNOSIS — N170 Acute kidney failure with tubular necrosis: Secondary | ICD-10-CM | POA: Diagnosis not present

## 2015-03-21 DIAGNOSIS — I2510 Atherosclerotic heart disease of native coronary artery without angina pectoris: Secondary | ICD-10-CM | POA: Diagnosis present

## 2015-03-21 DIAGNOSIS — R0689 Other abnormalities of breathing: Secondary | ICD-10-CM

## 2015-03-21 DIAGNOSIS — I5032 Chronic diastolic (congestive) heart failure: Secondary | ICD-10-CM | POA: Diagnosis present

## 2015-03-21 DIAGNOSIS — S301XXA Contusion of abdominal wall, initial encounter: Secondary | ICD-10-CM | POA: Insufficient documentation

## 2015-03-21 DIAGNOSIS — Z794 Long term (current) use of insulin: Secondary | ICD-10-CM

## 2015-03-21 DIAGNOSIS — W19XXXA Unspecified fall, initial encounter: Secondary | ICD-10-CM | POA: Insufficient documentation

## 2015-03-21 DIAGNOSIS — J690 Pneumonitis due to inhalation of food and vomit: Secondary | ICD-10-CM | POA: Diagnosis not present

## 2015-03-21 DIAGNOSIS — R0609 Other forms of dyspnea: Secondary | ICD-10-CM

## 2015-03-21 DIAGNOSIS — E1042 Type 1 diabetes mellitus with diabetic polyneuropathy: Secondary | ICD-10-CM | POA: Diagnosis present

## 2015-03-21 DIAGNOSIS — I509 Heart failure, unspecified: Secondary | ICD-10-CM

## 2015-03-21 DIAGNOSIS — R410 Disorientation, unspecified: Secondary | ICD-10-CM | POA: Insufficient documentation

## 2015-03-21 DIAGNOSIS — N179 Acute kidney failure, unspecified: Secondary | ICD-10-CM | POA: Diagnosis present

## 2015-03-21 DIAGNOSIS — Z8249 Family history of ischemic heart disease and other diseases of the circulatory system: Secondary | ICD-10-CM

## 2015-03-21 DIAGNOSIS — R413 Other amnesia: Secondary | ICD-10-CM | POA: Diagnosis present

## 2015-03-21 DIAGNOSIS — K269 Duodenal ulcer, unspecified as acute or chronic, without hemorrhage or perforation: Secondary | ICD-10-CM | POA: Diagnosis present

## 2015-03-21 DIAGNOSIS — B9620 Unspecified Escherichia coli [E. coli] as the cause of diseases classified elsewhere: Secondary | ICD-10-CM | POA: Diagnosis present

## 2015-03-21 DIAGNOSIS — G4733 Obstructive sleep apnea (adult) (pediatric): Secondary | ICD-10-CM | POA: Diagnosis present

## 2015-03-21 DIAGNOSIS — Z79899 Other long term (current) drug therapy: Secondary | ICD-10-CM | POA: Diagnosis not present

## 2015-03-21 DIAGNOSIS — R531 Weakness: Secondary | ICD-10-CM

## 2015-03-21 DIAGNOSIS — I222 Subsequent non-ST elevation (NSTEMI) myocardial infarction: Secondary | ICD-10-CM | POA: Diagnosis not present

## 2015-03-21 DIAGNOSIS — I639 Cerebral infarction, unspecified: Secondary | ICD-10-CM | POA: Diagnosis present

## 2015-03-21 DIAGNOSIS — I214 Non-ST elevation (NSTEMI) myocardial infarction: Secondary | ICD-10-CM | POA: Diagnosis not present

## 2015-03-21 DIAGNOSIS — Z01810 Encounter for preprocedural cardiovascular examination: Secondary | ICD-10-CM | POA: Diagnosis not present

## 2015-03-21 DIAGNOSIS — G92 Toxic encephalopathy: Secondary | ICD-10-CM | POA: Diagnosis present

## 2015-03-21 DIAGNOSIS — N183 Chronic kidney disease, stage 3 unspecified: Secondary | ICD-10-CM | POA: Diagnosis present

## 2015-03-21 DIAGNOSIS — I129 Hypertensive chronic kidney disease with stage 1 through stage 4 chronic kidney disease, or unspecified chronic kidney disease: Secondary | ICD-10-CM | POA: Diagnosis present

## 2015-03-21 DIAGNOSIS — R739 Hyperglycemia, unspecified: Secondary | ICD-10-CM | POA: Insufficient documentation

## 2015-03-21 DIAGNOSIS — R350 Frequency of micturition: Secondary | ICD-10-CM | POA: Insufficient documentation

## 2015-03-21 DIAGNOSIS — Z87891 Personal history of nicotine dependence: Secondary | ICD-10-CM | POA: Diagnosis not present

## 2015-03-21 DIAGNOSIS — E1022 Type 1 diabetes mellitus with diabetic chronic kidney disease: Secondary | ICD-10-CM | POA: Diagnosis present

## 2015-03-21 DIAGNOSIS — Z9861 Coronary angioplasty status: Secondary | ICD-10-CM

## 2015-03-21 DIAGNOSIS — R4182 Altered mental status, unspecified: Secondary | ICD-10-CM | POA: Diagnosis not present

## 2015-03-21 DIAGNOSIS — K274 Chronic or unspecified peptic ulcer, site unspecified, with hemorrhage: Secondary | ICD-10-CM | POA: Diagnosis not present

## 2015-03-21 DIAGNOSIS — R0602 Shortness of breath: Secondary | ICD-10-CM

## 2015-03-21 HISTORY — DX: Obstructive sleep apnea (adult) (pediatric): G47.33

## 2015-03-21 HISTORY — DX: Obesity, unspecified: E66.9

## 2015-03-21 HISTORY — DX: Other forms of dyspnea: R06.09

## 2015-03-21 HISTORY — DX: Dysphagia, unspecified: R13.10

## 2015-03-21 HISTORY — DX: Chronic or unspecified peptic ulcer, site unspecified, with hemorrhage: K27.4

## 2015-03-21 HISTORY — DX: Chronic diastolic (congestive) heart failure: I50.32

## 2015-03-21 HISTORY — DX: Localized edema: R60.0

## 2015-03-21 HISTORY — DX: Weakness: R53.1

## 2015-03-21 HISTORY — DX: Nonrheumatic aortic (valve) stenosis: I35.0

## 2015-03-21 HISTORY — DX: Dyspnea, unspecified: R06.00

## 2015-03-21 HISTORY — DX: Thrombocytopenia, unspecified: D69.6

## 2015-03-21 HISTORY — DX: Acute myocardial infarction, unspecified: I21.9

## 2015-03-21 HISTORY — DX: Type 1 diabetes mellitus with diabetic polyneuropathy: E10.42

## 2015-03-21 HISTORY — DX: Chronic kidney disease, stage 3 unspecified: N18.30

## 2015-03-21 HISTORY — DX: Other nonrheumatic aortic valve disorders: I35.8

## 2015-03-21 HISTORY — DX: Chronic kidney disease, stage 3 (moderate): N18.3

## 2015-03-21 HISTORY — DX: Other specified diabetes mellitus without complications: E13.9

## 2015-03-21 HISTORY — DX: Presence of aortocoronary bypass graft: Z95.1

## 2015-03-21 HISTORY — DX: Atherosclerotic heart disease of native coronary artery without angina pectoris: I25.10

## 2015-03-21 LAB — COMPREHENSIVE METABOLIC PANEL
ALT: 14 U/L (ref 0–53)
AST: 22 U/L (ref 0–37)
Albumin: 3.2 g/dL — ABNORMAL LOW (ref 3.5–5.2)
Alkaline Phosphatase: 69 U/L (ref 39–117)
Anion gap: 13 (ref 5–15)
BUN: 27 mg/dL — ABNORMAL HIGH (ref 6–23)
CO2: 24 mmol/L (ref 19–32)
Calcium: 8.6 mg/dL (ref 8.4–10.5)
Chloride: 99 mmol/L (ref 96–112)
Creatinine, Ser: 2.06 mg/dL — ABNORMAL HIGH (ref 0.50–1.35)
GFR calc Af Amer: 35 mL/min — ABNORMAL LOW (ref >90)
GFR calc non Af Amer: 30 mL/min — ABNORMAL LOW (ref >90)
Glucose, Bld: 299 mg/dL — ABNORMAL HIGH (ref 70–99)
Potassium: 3.9 mmol/L (ref 3.5–5.1)
Sodium: 136 mmol/L (ref 135–145)
Total Bilirubin: 1.5 mg/dL — ABNORMAL HIGH (ref 0.3–1.2)
Total Protein: 6.2 g/dL (ref 6.0–8.3)

## 2015-03-21 LAB — CBC
HCT: 39.7 % (ref 39.0–52.0)
Hemoglobin: 13.3 g/dL (ref 13.0–17.0)
MCH: 30.4 pg (ref 26.0–34.0)
MCHC: 33.5 g/dL (ref 30.0–36.0)
MCV: 90.6 fL (ref 78.0–100.0)
Platelets: 107 10*3/uL — ABNORMAL LOW (ref 150–400)
RBC: 4.38 MIL/uL (ref 4.22–5.81)
RDW: 14.9 % (ref 11.5–15.5)
WBC: 18.4 10*3/uL — ABNORMAL HIGH (ref 4.0–10.5)

## 2015-03-21 LAB — URINALYSIS, ROUTINE W REFLEX MICROSCOPIC
Glucose, UA: 100 mg/dL — AB
Ketones, ur: NEGATIVE mg/dL
Nitrite: NEGATIVE
Protein, ur: 100 mg/dL — AB
Specific Gravity, Urine: 1.017 (ref 1.005–1.030)
Urobilinogen, UA: 1 mg/dL (ref 0.0–1.0)
pH: 5 (ref 5.0–8.0)

## 2015-03-21 LAB — URINE MICROSCOPIC-ADD ON

## 2015-03-21 LAB — PROTIME-INR
INR: 1.12 (ref 0.00–1.49)
Prothrombin Time: 14.5 seconds (ref 11.6–15.2)

## 2015-03-21 LAB — GLUCOSE, CAPILLARY
Glucose-Capillary: 261 mg/dL — ABNORMAL HIGH (ref 70–99)
Glucose-Capillary: 158 mg/dL — ABNORMAL HIGH (ref 70–99)

## 2015-03-21 MED ORDER — ACETAMINOPHEN 650 MG RE SUPP: 650.0000 mg | Freq: Four times a day (QID) | RECTAL | Status: DC | PRN

## 2015-03-21 MED ORDER — AMLODIPINE BESYLATE 5 MG PO TABS
5.0000 mg | ORAL_TABLET | Freq: Every day | ORAL | Status: DC
  Administered 2015-03-22 – 2015-03-29 (×8): 5 mg via ORAL
  Filled 2015-03-21 (×8): qty 1

## 2015-03-21 MED ORDER — CEFTRIAXONE SODIUM 1 G IJ SOLR
1.0000 g | Freq: Once | INTRAMUSCULAR | Status: AC
  Administered 2015-03-21: 1 g via INTRAVENOUS
  Filled 2015-03-21: qty 10

## 2015-03-21 MED ORDER — INSULIN ASPART 100 UNIT/ML ~~LOC~~ SOLN: 0.0000 [IU] | Freq: Every day | SUBCUTANEOUS | Status: DC

## 2015-03-21 MED ORDER — TAMSULOSIN HCL 0.4 MG PO CAPS
0.4000 mg | ORAL_CAPSULE | Freq: Every day | ORAL | Status: DC
  Filled 2015-03-21 (×2): qty 1

## 2015-03-21 MED ORDER — AMITRIPTYLINE HCL 50 MG PO TABS
50.0000 mg | ORAL_TABLET | Freq: Every day | ORAL | Status: DC
  Administered 2015-03-21 – 2015-03-28 (×7): 50 mg via ORAL
  Filled 2015-03-21 (×10): qty 1

## 2015-03-21 MED ORDER — CEFTRIAXONE SODIUM IN DEXTROSE 20 MG/ML IV SOLN
1.0000 g | INTRAVENOUS | Status: DC
  Administered 2015-03-22: 1 g via INTRAVENOUS
  Filled 2015-03-21: qty 50

## 2015-03-21 MED ORDER — ROSUVASTATIN CALCIUM 20 MG PO TABS
20.0000 mg | ORAL_TABLET | Freq: Every day | ORAL | Status: DC
  Administered 2015-03-22 – 2015-04-03 (×12): 20 mg via ORAL
  Filled 2015-03-21 (×13): qty 1

## 2015-03-21 MED ORDER — ASPIRIN EC 325 MG PO TBEC
325.0000 mg | DELAYED_RELEASE_TABLET | Freq: Every day | ORAL | Status: DC
  Administered 2015-03-21 – 2015-03-25 (×5): 325 mg via ORAL
  Filled 2015-03-21 (×5): qty 1

## 2015-03-21 MED ORDER — INSULIN ASPART 100 UNIT/ML ~~LOC~~ SOLN
0.0000 [IU] | Freq: Three times a day (TID) | SUBCUTANEOUS | Status: DC
  Administered 2015-03-21: 11 [IU] via SUBCUTANEOUS
  Administered 2015-03-22: 4 [IU] via SUBCUTANEOUS
  Administered 2015-03-22 (×2): 3 [IU] via SUBCUTANEOUS

## 2015-03-21 MED ORDER — METOPROLOL SUCCINATE ER 25 MG PO TB24
25.0000 mg | ORAL_TABLET | Freq: Every day | ORAL | Status: DC
  Administered 2015-03-22 – 2015-03-27 (×6): 25 mg via ORAL
  Filled 2015-03-21 (×6): qty 1

## 2015-03-21 MED ORDER — ALUM & MAG HYDROXIDE-SIMETH 200-200-20 MG/5ML PO SUSP: 30.0000 mL | Freq: Four times a day (QID) | ORAL | Status: DC | PRN

## 2015-03-21 MED ORDER — INSULIN ASPART 100 UNIT/ML ~~LOC~~ SOLN
15.0000 [IU] | Freq: Three times a day (TID) | SUBCUTANEOUS | Status: DC
  Administered 2015-03-22 (×3): 15 [IU] via SUBCUTANEOUS

## 2015-03-21 MED ORDER — ONDANSETRON HCL 4 MG/2ML IJ SOLN: 4.0000 mg | Freq: Four times a day (QID) | INTRAMUSCULAR | Status: DC | PRN

## 2015-03-21 MED ORDER — DONEPEZIL HCL 10 MG PO TABS
10.0000 mg | ORAL_TABLET | Freq: Every day | ORAL | Status: DC
  Administered 2015-03-22 – 2015-04-03 (×12): 10 mg via ORAL
  Filled 2015-03-21 (×13): qty 1

## 2015-03-21 MED ORDER — INSULIN ASPART 100 UNIT/ML ~~LOC~~ SOLN: 0.0000 [IU] | Freq: Three times a day (TID) | SUBCUTANEOUS | Status: DC

## 2015-03-21 MED ORDER — OMEGA-3-ACID ETHYL ESTERS 1 G PO CAPS
1.0000 g | ORAL_CAPSULE | Freq: Two times a day (BID) | ORAL | Status: DC
  Administered 2015-03-21 – 2015-04-03 (×18): 1 g via ORAL
  Filled 2015-03-21 (×29): qty 1

## 2015-03-21 MED ORDER — INSULIN GLARGINE 100 UNIT/ML ~~LOC~~ SOLN
50.0000 [IU] | Freq: Every morning | SUBCUTANEOUS | Status: DC
  Administered 2015-03-22: 50 [IU] via SUBCUTANEOUS
  Filled 2015-03-21 (×2): qty 0.5

## 2015-03-21 MED ORDER — CLOPIDOGREL BISULFATE 75 MG PO TABS
75.0000 mg | ORAL_TABLET | Freq: Every day | ORAL | Status: DC
  Administered 2015-03-22 – 2015-03-28 (×7): 75 mg via ORAL
  Filled 2015-03-21 (×7): qty 1

## 2015-03-21 MED ORDER — OMEGA-3 FATTY ACIDS 1000 MG PO CAPS: 1.0000 g | ORAL_CAPSULE | Freq: Two times a day (BID) | ORAL | Status: DC

## 2015-03-21 MED ORDER — OXYCODONE HCL 5 MG PO TABS: 5.0000 mg | ORAL_TABLET | ORAL | Status: DC | PRN

## 2015-03-21 MED ORDER — ACETAMINOPHEN 325 MG PO TABS: 650.0000 mg | ORAL_TABLET | Freq: Four times a day (QID) | ORAL | Status: DC | PRN

## 2015-03-21 MED ORDER — SODIUM CHLORIDE 0.9 % IV SOLN
INTRAVENOUS | Status: DC
  Administered 2015-03-21: 18:00:00 via INTRAVENOUS

## 2015-03-21 MED ORDER — ONDANSETRON HCL 4 MG PO TABS
4.0000 mg | ORAL_TABLET | Freq: Four times a day (QID) | ORAL | Status: DC | PRN
  Administered 2015-03-22: 4 mg via ORAL
  Filled 2015-03-21: qty 1

## 2015-03-21 NOTE — Progress Notes (Signed)
Called report at 54, Martie Round, Therapist, sports.    Penni Bombard, RN 03/21/2015 4:08 PM

## 2015-03-21 NOTE — H&P (Signed)
[facility] History and Physical  Randall Wilkins UXL:[id number] DOB: Mar 30, 1941 DOA: 03/21/2015  Referring physician: EDP PCP: Simona Huh, MD   Chief Complaint: UTI, confusion  HPI: Randall Wilkins is a 74 y.o. male with a PMH of stroke, MI, CAD, DM, Rhabdomyolysis, short term memory loss and hypertension who presents today with 2-3 days of increased confusion, generalized weakness and increased urinary frequency. Wife states for the past 2-3 days she has noticed he has been dragging his feet when he is walking more than usual and also that he normally uses a cane but has had to use a walker during this time. Pt has residual L leg weakness from the rhabdomyolysis 6 years ago but his R leg has had minimal weakness until this current episode. Pt has also been forgetting to take his insulin in the mornings while the wife has been at work for the past week, this is unusual for him. Early this morning pt fell 3 times while he was trying to get to the restroom and the wife had to assist him in standing each time. He denies hitting his head or LOC with each fall. Pt was found to have a large hematoma to his R lateral abdomen this afternoon, was not there yesterday and pt and wife believe it was due to the falls. Pt has had unexplained daily diarrhea for the past two weeks.Today pt's wife has noticed he has had two small episodes of bowel incontinence, new for the patient. Pt was diagnosed prior to arrival in the ED with a UTI by his PCP and was told to come to the ED to have IV abx administered. Pt has also been complaining of worsening exertional dyspnea over the past few days, this is new for him. He denies any cp, abdominal pain, nausea, vomiting, constipation, changes in vision, dizziness.   Of note, when patient had his stroke 2 years ago pt was on plavix at the time from his previous MI and stent placement. His wife states his short term memory loss began after the stroke.   In the ED patient  was found to have a fever, have leukocytosis, mild confusion and evidence of a UTI. [facility] asked for admission for UTI.   Review of Systems:  Constitutional: No fevers, chills. HEENT: No changes in vision Cardio-vascular: No chest pain, PND, worsening swelling in lower extremities, anasarca, dizziness, palpitations  GI: Positive for diarrhea, bowel incontinence. No heartburn, indigestion, abdominal pain, nausea, vomiting. Resp: Positive for exertional dypsnea.  Skin: Positive for large hematoma to R lateral abdomen. GU: Positive for increased urinary frequency Musculoskeletal: No joint pain or swelling. No decreased range of motion. No back pain.  Neuro: Positive for increased weakness of the R leg, generalized weakness, confusion, worsening short term memory loss.  Psych: No change in mood or affect. No depression or anxiety.  Past Medical History  Diagnosis Date  . Rhabdomyolysis   . Memory loss   . Exogenous obesity   . Insulin dependent diabetes mellitus   . Peripheral neuropathy   . Venous insufficiency   . CAD (coronary artery disease)   . Hyperlipidemia   . Gout   . Stroke     L patietal with small scattered lacunar infarcts  . Left foot drop   . Hypertension   . History of nuclear stress test 07/2011    dipyridamole; fixed inferolateral defect, worse at stress than rest; no reversible ischemia; low risk scan   . OSA on CPAP  uses a cpap  . Heart attack    Past Surgical History  Procedure Laterality Date  . Cholecystectomy    . Sinus endo w/fusion    . Coronary angioplasty  10/13/1996  . Rotator cuff repair Right   . Transthoracic echocardiogram  08/08/2013    EF 55-60%, mild conc hypertrophy, grade 1 diastolic dysfunction; AV with mild stenosis; LA & RA mildly dilated  . Coronary angioplasty  09/21/1989    emergency PTCA  . Coronary angioplasty  10/13/1996    Multi-Link diagonal & OD stenting (Dr. Marella Chimes)  . Coronary angioplasty  12/03/1997    disease of mid  DX-1 ~50% & in mid PLA & PDA (distal lesions) (Dr. Marella Chimes)   . Coronary angioplasty  10/14/1999    progression of disease distal PLA & PDA; progression of disease prox RCA - moderate (Dr. Marella Chimes)   . Coronary angioplasty with stent placement  04/04/2004    4.0x75mm non-DES (thrombectomy via AngioJet) to RCA for high grade stenosis (Dr. Marella Chimes)  . Carotid doppler  03/2013    bilat bulb/prox ICAs - mild amount of fibrous plaque with no evidence of diameter reduction  . Back surgery  2002    lumbosacral  . Tonsillectomy    . Colonoscopy    . Carpal tunnel release Bilateral 08/09/2014    Procedure: BILATERAL CARPAL TUNNEL RELEASE;  Surgeon: Daryll Brod, MD;  Location: [facility];  Service: Orthopedics;  Laterality: Bilateral;  ANESTHESIA:  IV REGIONAL BIL FAB   Social History:  reports that he quit smoking about 42 years ago. His smoking use included Cigarettes. He has a 40 pack-year smoking history. He has never used smokeless tobacco. He reports that he does not drink alcohol or use illicit drugs.  Allergies  Allergen Reactions  . Actos [Pioglitazone] Swelling  . Metformin And Related Nausea Only  . Niaspan [Niacin Er] Itching and Rash    Family History  Problem Relation Age of Onset  . Coronary artery disease Father   . Heart disease Mother   . Cancer Maternal Grandmother   . Heart Problems Maternal Grandfather     Prior to Admission medications   Medication Sig Start Date End Date Taking? Authorizing Provider  allopurinol (ZYLOPRIM) 100 MG tablet Take 300 mg by mouth daily.    Yes Historical Provider, MD  amitriptyline (ELAVIL) 50 MG tablet Take 50 mg by mouth at bedtime.   Yes Historical Provider, MD  amLODipine (NORVASC) 5 MG tablet Take 5 mg by mouth daily.   Yes Historical Provider, MD  aspirin EC 81 MG tablet Take 81 mg by mouth daily.   Yes Historical Provider, MD  clopidogrel (PLAVIX) 75 MG tablet Take 75 mg by mouth daily.   Yes Historical  Provider, MD  colchicine 0.6 MG tablet Take 0.6 mg by mouth daily as needed.   Yes Historical Provider, MD  donepezil (ARICEPT) 10 MG tablet Take 1 tablet (10 mg total) by mouth daily. 09/10/14  Yes Marcial Pacas, MD  fish oil-omega-3 fatty acids 1000 MG capsule Take 1 g by mouth 2 (two) times daily.    Yes Historical Provider, MD  HYDROcodone-acetaminophen (VICODIN) 5-500 MG per tablet Take 2 tablets by mouth at bedtime.    Yes Historical Provider, MD  Insulin Glargine 300 UNIT/ML SOPN Inject 60 Units into the skin every morning.   Yes Historical Provider, MD  insulin lispro (HUMALOG) 100 UNIT/ML injection Inject 20-25 Units into the skin 4 (four) times daily. Takes  25u in am, 20u at lunch, 20u in evening and 25u at bedtime   Yes Historical Provider, MD  lisinopril (PRINIVIL,ZESTRIL) 5 MG tablet Take 10 mg by mouth daily.    Yes Historical Provider, MD  metoprolol succinate (TOPROL-XL) 25 MG 24 hr tablet Take 25 mg by mouth daily.   Yes Historical Provider, MD  NON FORMULARY Inhale 1 application into the lungs at bedtime. CPAP   Yes Historical Provider, MD  potassium chloride SA (K-DUR,KLOR-CON) 20 MEQ tablet Take 20 mEq by mouth daily.    Yes Historical Provider, MD  rosuvastatin (CRESTOR) 20 MG tablet Take 20 mg by mouth daily.   Yes Historical Provider, MD  Tamsulosin HCl (FLOMAX) 0.4 MG CAPS Take 0.4 mg by mouth daily.    Yes Historical Provider, MD  torsemide (DEMADEX) 10 MG tablet Take 50 mg by mouth daily.   Yes Historical Provider, MD   Physical Exam: Filed Vitals:   03/21/15 1445 03/21/15 1502 03/21/15 1530 03/21/15 1600  BP: 106/37  97/69 111/71  Pulse: 74  74 71  Temp:  99.7 F (37.6 C)    TempSrc:  Oral    Resp: 16  22 25   Height:      Weight:      SpO2: 94%  92% 93%    Wt Readings from Last 3 Encounters:  03/21/15 117.935 kg (260 lb)  12/11/14 117.935 kg (260 lb)  11/14/14 116.62 kg (257 lb 1.6 oz)    General:  Appears calm and comfortable, wife at bedside. Eyes: PERRL,  normal lids, irises. ENT: grossly normal hearing. Mildly dry mucus membranes Neck: no LAD, masses or thyromegaly Cardiovascular: RRR, no m/r/g. No change in LE edema (baseline L>R) Respiratory: CTA bilaterally, no w/r/r. Normal respiratory effort. Abdomen: soft, ntnd. Large hematoma to R lateral abdomen  9"x5" Skin: no rash or induration seen on limited exam Musculoskeletal: grossly normal tone BUE/BLE Psychiatric: grossly normal mood and affect, speech fluent and appropriate Neurologic: Strength: LLE: 3/5, RLE: 4/5; Bilateral UE: 5/5. EOM intact.           Labs on Admission:  Basic Metabolic Panel:  Recent Labs Lab 03/21/15 1326  NA 136  K 3.9  CL 99  CO2 24  GLUCOSE 299*  BUN 27*  CREATININE 2.06*  CALCIUM 8.6   Liver Function Tests:  Recent Labs Lab 03/21/15 1326  AST 22  ALT 14  ALKPHOS 69  BILITOT 1.5*  PROT 6.2  ALBUMIN 3.2*   CBC:  Recent Labs Lab 03/21/15 1326  WBC 18.4*  HGB 13.3  HCT 39.7  MCV 90.6  PLT 107*    EKG: Independently reviewed. Sinus rhythm.  Assessment/Plan Principal Problem:   UTI (urinary tract infection) Active Problems:   Confusion   Memory impairment   Hyperlipidemia   Diabetes 1.5, managed as type 1   CVA (cerebral infarction)   CAD (coronary artery disease)   Bilateral leg edema   Weakness generalized   Hyperglycemia   Thrombocytopenia   Fall   Hematoma of abdominal wall   Diarrhea   Obstructive sleep apnea   Acute renal failure   Mild diastolic dysfunction   Dyspnea on exertion  UTI - 2-3 day hx of increased urinary frequency.  - Diagnosed in PCP office and sent to ED for treatment. - UA positive, send urine culture.  - IV Rocephin  Acute renal failure: - Creatinine 2.06 (baseline 1.5), likely due to dehydration in the setting of diarrhea, he has also been on Demadex -  gentle hydration overnight.   Acute encephalopathy - improved on our evaluation - continue antibiotics for UTI  Diabetes: - Will  continue to provide lantus and novolog - obtain A1C.   Dyspnea on exertion: - With hx of diastolic dysfunction will obtain 2D echo while admitted.  - CXR pending.   Hematoma of abdominal wall  - following a fall at home - Will monitor size of hematoma as well as HBG and HCT levels.  - Continuing plavix.   Diarrhea:  - Obtaining stool culture and PCR. - If c. Diff negative, will give imodium.   Thrombocytopenia: Have no baseline, unclear etiology, monitor   Fall: - likely due to increased weakness from current UTI - PT eval  OSA: - CPAP QHS   Code Status: Full code DVT Prophylaxis: SCDs Family Communication:  Wife is at bedside and voices understanding and agreement with plan. Disposition Plan: admit    Damaris Hippo, PA-S Imogene Burn, PA-C [facility] Pager [phone]    Sharetta Ricchio M. Cruzita Lederer, MD [facility] [phone]

## 2015-03-21 NOTE — ED Provider Notes (Signed)
CSN: [id number]     Arrival date & time 03/21/15  1255 History   First MD Initiated Contact with Patient 03/21/15 1424     Chief Complaint  Patient presents with  . Urinary Frequency  . Altered Mental Status   Patient is a 74 y.o. male presenting with frequency and altered mental status. The history is provided by the patient and the spouse.  Urinary Frequency This is a new problem. The current episode started yesterday. The problem occurs daily. The problem has been gradually worsening. Pertinent negatives include no chest pain and no abdominal pain. Nothing aggravates the symptoms. Nothing relieves the symptoms.  Altered Mental Status Presenting symptoms: confusion   Associated symptoms: no abdominal pain   Patient presents for increased confusion, urinary frequency and fatigue This has increased over past 24 Hrs No known fever No HA No vomiting He did fall today due to unsteadiness but no injury reported  Seen by PCP today Found to have UTI and sent for admission   Past Medical History  Diagnosis Date  . Rhabdomyolysis   . Memory loss   . Exogenous obesity   . Insulin dependent diabetes mellitus   . Peripheral neuropathy   . Venous insufficiency   . CAD (coronary artery disease)   . Hyperlipidemia   . Gout   . Stroke     L patietal with small scattered lacunar infarcts  . Left foot drop   . Hypertension   . History of nuclear stress test 07/2011    dipyridamole; fixed inferolateral defect, worse at stress than rest; no reversible ischemia; low risk scan   . OSA on CPAP     uses a cpap   Past Surgical History  Procedure Laterality Date  . Cholecystectomy    . Sinus endo w/fusion    . Coronary angioplasty  10/13/1996  . Rotator cuff repair Right   . Transthoracic echocardiogram  08/08/2013    EF 55-60%, mild conc hypertrophy, grade 1 diastolic dysfunction; AV with mild stenosis; LA & RA mildly dilated  . Coronary angioplasty  09/21/1989    emergency PTCA  . Coronary  angioplasty  10/13/1996    Multi-Link diagonal & OD stenting (Dr. Marella Chimes)  . Coronary angioplasty  12/03/1997    disease of mid DX-1 ~50% & in mid PLA & PDA (distal lesions) (Dr. Marella Chimes)   . Coronary angioplasty  10/14/1999    progression of disease distal PLA & PDA; progression of disease prox RCA - moderate (Dr. Marella Chimes)   . Coronary angioplasty with stent placement  04/04/2004    4.0x7mm non-DES (thrombectomy via AngioJet) to RCA for high grade stenosis (Dr. Marella Chimes)  . Carotid doppler  03/2013    bilat bulb/prox ICAs - mild amount of fibrous plaque with no evidence of diameter reduction  . Back surgery  2002    lumbosacral  . Tonsillectomy    . Colonoscopy    . Carpal tunnel release Bilateral 08/09/2014    Procedure: BILATERAL CARPAL TUNNEL RELEASE;  Surgeon: Daryll Brod, MD;  Location: [facility];  Service: Orthopedics;  Laterality: Bilateral;  ANESTHESIA:  IV REGIONAL BIL FAB   Family History  Problem Relation Age of Onset  . Coronary artery disease Father   . Heart disease Mother   . Cancer Maternal Grandmother   . Heart Problems Maternal Grandfather    History  Substance Use Topics  . Smoking status: Former Smoker -- 2.00 packs/day for 20 years    Types:  Cigarettes    Quit date: 01/25/1973  . Smokeless tobacco: Never Used  . Alcohol Use: No    Review of Systems  Constitutional: Positive for fatigue.  Cardiovascular: Negative for chest pain.  Gastrointestinal: Negative for abdominal pain.  Genitourinary: Positive for frequency.  Psychiatric/Behavioral: Positive for confusion.  All other systems reviewed and are negative.     Allergies  Niaspan  Home Medications   Prior to Admission medications   Medication Sig Start Date End Date Taking? Authorizing Provider  allopurinol (ZYLOPRIM) 100 MG tablet Take 300 mg by mouth daily.     Historical Provider, MD  amitriptyline (ELAVIL) 50 MG tablet Take 50 mg by mouth at bedtime.     Historical Provider, MD  amLODipine (NORVASC) 5 MG tablet Take 5 mg by mouth daily.    Historical Provider, MD  aspirin EC 81 MG tablet Take 81 mg by mouth daily.    Historical Provider, MD  clopidogrel (PLAVIX) 75 MG tablet Take 75 mg by mouth daily.    Historical Provider, MD  colchicine 0.6 MG tablet Take 0.6 mg by mouth daily as needed.    Historical Provider, MD  donepezil (ARICEPT) 10 MG tablet Take 1 tablet (10 mg total) by mouth daily. 09/10/14   Marcial Pacas, MD  fish oil-omega-3 fatty acids 1000 MG capsule Take 2 g by mouth daily.     Historical Provider, MD  HYDROcodone-acetaminophen (VICODIN) 5-500 MG per tablet Take 1 tablet by mouth every 6 (six) hours as needed for pain.    Historical Provider, MD  insulin glargine (LANTUS) 100 UNIT/ML injection Inject 30 Units into the skin at bedtime.     Historical Provider, MD  insulin lispro (HUMALOG) 100 UNIT/ML injection Inject into the skin 3 (three) times daily before meals.    Historical Provider, MD  lisinopril (PRINIVIL,ZESTRIL) 5 MG tablet Take 5 mg by mouth daily.    Historical Provider, MD  metoprolol succinate (TOPROL-XL) 25 MG 24 hr tablet Take 25 mg by mouth daily.    Historical Provider, MD  NON FORMULARY at bedtime. CPAP    Historical Provider, MD  potassium chloride SA (K-DUR,KLOR-CON) 20 MEQ tablet Take 20 mEq by mouth 2 (two) times daily.    Historical Provider, MD  rosuvastatin (CRESTOR) 20 MG tablet Take 20 mg by mouth daily.    Historical Provider, MD  Tamsulosin HCl (FLOMAX) 0.4 MG CAPS Take by mouth.    Historical Provider, MD  torsemide (DEMADEX) 10 MG tablet Take 50 mg by mouth daily.    Historical Provider, MD   BP 106/37 mmHg  Pulse 74  Temp(Src) 99.7 F (37.6 C) (Oral)  Resp 16  Ht 5\' 9"  (1.753 m)  Wt 260 lb (117.935 kg)  BMI 38.38 kg/m2  SpO2 94% Physical Exam CONSTITUTIONAL: elderly but no acute distress noted HEAD: Normocephalic/atraumatic EYES: EOMI/PERRL ENMT: Mucous membranes moist NECK: supple no  meningeal signs SPINE/BACK:entire spine nontender CV: S1/S2 noted, no murmurs/rubs/gallops noted LUNGS: Lungs are clear to auscultation bilaterally, no apparent distress ABDOMEN: soft, nontender, no rebound or guarding, bowel sounds noted throughout abdomen GU:no cva tenderness NEURO: Pt is awake/alert/appropriate, moves all extremitiesx4.  No facial droop.  He is not confused currently EXTREMITIES: pulses normal/equal, full ROM, no tenderness, All extremities/joints palpated/ranged and nontender SKIN: warm, color normal PSYCH: no abnormalities of mood noted, alert and oriented to situation  ED Course  Procedures    3:28 PM Pt sent for admission His u/a at office showed UTI He is in no distress  He also has acute on chronic renal failure D/w [facility] will admit (d/w [employer]) Pt is currently awake/alert, he is not confused and is not septic appearing  Labs Review Labs Reviewed  CBC - Abnormal; Notable for the following:    WBC 18.4 (*)    Platelets 107 (*)    All other components within normal limits  COMPREHENSIVE METABOLIC PANEL - Abnormal; Notable for the following:    Glucose, Bld 299 (*)    BUN 27 (*)    Creatinine, Ser 2.06 (*)    Albumin 3.2 (*)    Total Bilirubin 1.5 (*)    GFR calc non Af Amer 30 (*)    GFR calc Af Amer 35 (*)    All other components within normal limits  URINE CULTURE  PROTIME-INR  URINALYSIS, ROUTINE W REFLEX MICROSCOPIC     EKG Interpretation   Date/Time:  Thursday March 21 2015 13:21:43 EDT Ventricular Rate:  78 PR Interval:  248 QRS Duration: 82 QT Interval:  354 QTC Calculation: 403 R Axis:   -33 Text Interpretation:  Sinus rhythm with 1st degree A-V block Left axis  deviation Abnormal ECG artifact noted Confirmed by Christy Gentles  MD, Elenore Rota  [id number]) on 03/21/2015 2:25:30 PM     Medications  cefTRIAXone (ROCEPHIN) 1 g in dextrose 5 % 50 mL IVPB (1 g Intravenous New Bag/Given 03/21/15 1510)    MDM   Final diagnoses:  UTI (lower  urinary tract infection)  Dehydration  AKI (acute kidney injury)    Nursing notes including past medical history and social history reviewed and considered in documentation Labs/vital reviewed myself and considered during evaluation     Ripley Fraise, MD 03/21/15 1529

## 2015-03-21 NOTE — ED Notes (Signed)
Pt went to PCP today for evaluation of urinary frequency with some incontinence that is usually controlled with Flomax but for the past 2 days the medication has not been working.  Pt admits to generalized weakness and has had some confusion that started 2 days ago.  Pt alert and oriented X 4 at present.

## 2015-03-22 ENCOUNTER — Inpatient Hospital Stay (HOSPITAL_COMMUNITY): Payer: Medicare Other

## 2015-03-22 DIAGNOSIS — R0600 Dyspnea, unspecified: Secondary | ICD-10-CM

## 2015-03-22 DIAGNOSIS — N179 Acute kidney failure, unspecified: Secondary | ICD-10-CM

## 2015-03-22 LAB — GLUCOSE, CAPILLARY
Glucose-Capillary: 194 mg/dL — ABNORMAL HIGH (ref 70–99)
Glucose-Capillary: 164 mg/dL — ABNORMAL HIGH (ref 70–99)
Glucose-Capillary: 128 mg/dL — ABNORMAL HIGH (ref 70–99)
Glucose-Capillary: 113 mg/dL — ABNORMAL HIGH (ref 70–99)

## 2015-03-22 LAB — BASIC METABOLIC PANEL
Anion gap: 8 (ref 5–15)
BUN: 38 mg/dL — ABNORMAL HIGH (ref 6–23)
CO2: 26 mmol/L (ref 19–32)
Calcium: 8.3 mg/dL — ABNORMAL LOW (ref 8.4–10.5)
Chloride: 102 mmol/L (ref 96–112)
Creatinine, Ser: 2.38 mg/dL — ABNORMAL HIGH (ref 0.50–1.35)
GFR calc Af Amer: 29 mL/min — ABNORMAL LOW (ref >90)
GFR calc non Af Amer: 25 mL/min — ABNORMAL LOW (ref >90)
Glucose, Bld: 187 mg/dL — ABNORMAL HIGH (ref 70–99)
Potassium: 4 mmol/L (ref 3.5–5.1)
Sodium: 136 mmol/L (ref 135–145)

## 2015-03-22 LAB — SODIUM, URINE, RANDOM: Sodium, Ur: <10 mmol/L

## 2015-03-22 LAB — CLOSTRIDIUM DIFFICILE BY PCR: Toxigenic C. Difficile by PCR: NEGATIVE

## 2015-03-22 LAB — CREATININE, URINE, RANDOM: Creatinine, Urine: 159.56 mg/dL

## 2015-03-22 MED ORDER — TAMSULOSIN HCL 0.4 MG PO CAPS
0.4000 mg | ORAL_CAPSULE | Freq: Every day | ORAL | Status: DC
  Administered 2015-03-22 – 2015-03-31 (×9): 0.4 mg via ORAL
  Filled 2015-03-22 (×13): qty 1

## 2015-03-22 MED ORDER — GLUCERNA SHAKE PO LIQD: 237.0000 mL | ORAL | Status: DC | PRN

## 2015-03-22 NOTE — Progress Notes (Signed)
[facility] PROGRESS NOTE  Randall Wilkins PJK:[id number] DOB: [birth year] DOA: 03/21/2015 PCP: Simona Huh, MD  Assessment/Plan: 1. Confusion and encephalopathy: Unclear etiology. Resolved. He is alert and oriented today.   Urinary tract infection:  On rocephin . Cultures are pending.   Hypertension: controlled.    Hyperlipidemia: Resume statin.   CAD: No chest pain or sob. Resume plavix, BB and statin.    DYSPHAGIA; Speech eval, as per the patient he was supposed to see Dr Oletta Lamas, please call Dr Oletta Lamas in am after speech evaluation.    Diabetes Mellitus: CBG (last 3)   Recent Labs  03/22/15 0744 03/22/15 1126 03/22/15 1644  GLUCAP 194* 164* 128*    Resume lantus SSI.    Acute renal failure: Worsened with hydyration. Stop fluids. Get US renal. Urine studies.    Questionable fall: Get PT EVAL  Mild thrombocytopenia: Monitor.    Leukocytosis: Possibly from UTI   ANEMIA: Normocytic. Monitor.       Code Status: full code  Family Communication: family at bedside Disposition Plan: pending.    Consultants:  none  Procedures:  none  Antibiotics:  rocpehin  HPI/Subjective:feel fine , want to go home.   Objective: Filed Vitals:   03/22/15 1700  BP: 120/50  Pulse: 70  Temp: 98.6 F (37 C)  Resp: 20    Intake/Output Summary (Last 24 hours) at 03/22/15 1822 Last data filed at 03/22/15 0900  Gross per 24 hour  Intake    240 ml  Output      0 ml  Net    240 ml   Filed Weights   03/21/15 1308 03/21/15 2100  Weight: 117.935 kg (260 lb) 117.164 kg (258 lb 4.8 oz)    Exam:   General:  Alert afebrile comfortable  Cardiovascular:s1s2  Respiratory:ctab  Abdomen: soft non tender non distended bowel sounds heard  Musculoskeletal: trace pedal edema.   Data Reviewed: Basic Metabolic Panel:  Recent Labs Lab 03/21/15 1326 03/22/15 0420  NA 136 136  K 3.9 4.0  CL 99 102  CO2 24 26  GLUCOSE 299* 187*  BUN 27*  38*  CREATININE 2.06* 2.38*  CALCIUM 8.6 8.3*   Liver Function Tests:  Recent Labs Lab 03/21/15 1326  AST 22  ALT 14  ALKPHOS 69  BILITOT 1.5*  PROT 6.2  ALBUMIN 3.2*   No results for input(s): LIPASE, AMYLASE in the last 168 hours. No results for input(s): AMMONIA in the last 168 hours. CBC:  Recent Labs Lab 03/21/15 1326 03/22/15 0420  WBC 18.4* 16.7*  HGB 13.3 11.6*  HCT 39.7 35.1*  MCV 90.6 90.0  PLT 107* 109*   Cardiac Enzymes: No results for input(s): CKTOTAL, CKMB, CKMBINDEX, TROPONINI in the last 168 hours. BNP (last 3 results) No results for input(s): BNP in the last 8760 hours.  ProBNP (last 3 results) No results for input(s): PROBNP in the last 8760 hours.  CBG:  Recent Labs Lab 03/21/15 1646 03/21/15 2244 03/22/15 0744 03/22/15 1126 03/22/15 1644  GLUCAP 261* 158* 194* 164* 128*    Recent Results (from the past 240 hour(s))  Urine culture     Status: None (Preliminary result)   Collection Time: 03/21/15  3:02 PM  Result Value Ref Range Status   Specimen Description URINE, CLEAN CATCH  Final   Special Requests NONE  Final   Colony Count   Final    >=100,000 COLONIES/ML Performed at [employer] [facility]   Final  ESCHERICHIA COLI Performed at [facility]    Report Status PENDING  Incomplete  Clostridium Difficile by PCR     Status: None   Collection Time: 03/21/15  9:33 PM  Result Value Ref Range Status   C difficile by pcr NEGATIVE NEGATIVE Final     Studies: X-ray Chest Pa And Lateral  03/22/2015   CLINICAL DATA:  Shortness of breath  EXAM: CHEST  2 VIEW  COMPARISON:  11/14/2009  FINDINGS: No cardiomegaly for technique. Aortic and hilar contours are negative. 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 No edema, consolidation, effusion, or pneumothorax.  IMPRESSION: No active cardiopulmonary disease.   Electronically Signed   By: Monte Fantasia M.D.   On: 03/22/2015 01:10    Scheduled  Meds: . amitriptyline  50 mg Oral QHS  . amLODipine  5 mg Oral Daily  . aspirin EC  325 mg Oral Daily  . cefTRIAXone (ROCEPHIN)  IV  1 g Intravenous Q24H  . clopidogrel  75 mg Oral Daily  . donepezil  10 mg Oral Daily  . insulin aspart  0-20 Units Subcutaneous TID WC  . insulin aspart  0-5 Units Subcutaneous QHS  . insulin aspart  15 Units Subcutaneous TID WC  . insulin glargine  50 Units Subcutaneous q morning - 10a  . metoprolol succinate  25 mg Oral Daily  . omega-3 acid ethyl esters  1 g Oral BID  . rosuvastatin  20 mg Oral Daily  . tamsulosin  0.4 mg Oral QHS   Continuous Infusions: . sodium chloride 50 mL/hr at 03/21/15 1812    Principal Problem:   UTI (urinary tract infection) Active Problems:   Memory impairment   Hyperlipidemia   Diabetes 1.5, managed as type 1   CVA (cerebral infarction)   CAD (coronary artery disease)   Bilateral leg edema   Confusion   Weakness generalized   Hyperglycemia   Thrombocytopenia   Fall   Hematoma of abdominal wall   Diarrhea   Obstructive sleep apnea   Acute renal failure   Mild diastolic dysfunction   Dyspnea on exertion    Time spent: 35 minutes    [facility] Pager [phone]. If 7PM-7AM, please contact night-coverage at [email or web address], password [facility] 03/22/2015, 6:22 PM  LOS: 1 day

## 2015-03-22 NOTE — Evaluation (Signed)
Physical Therapy Evaluation Patient Details Name: Randall Wilkins MRN: [id number] DOB: 10/06/1941 Today's Date: 03/22/2015   History of Present Illness  Patient is a 74 yo male admitted 03/21/15 with UTI, confusion, and recent falls.  PMH:  CVA, CAD, MI, DM, peripheral neuropathy, HTN, LLE weakness post rhabdo, OSA on CPAP    Clinical Impression  Patient presents with problems listed below.  Will benefit from acute PT to maximize independence prior to discharge home with family.    Follow Up Recommendations Home health PT;Supervision/Assistance - 24 hour    Equipment Recommendations  None recommended by PT    Recommendations for Other Services       Precautions / Restrictions Precautions Precautions: Fall Precaution Comments: LLE weak and decreased sensation Required Braces or Orthoses: Other Brace/Splint Other Brace/Splint: Per wife, patient has brace for LLE. Restrictions Weight Bearing Restrictions: No      Mobility  Bed Mobility Overal bed mobility: Needs Assistance;+2 for physical assistance Bed Mobility: Rolling;Supine to Sit Rolling: Mod assist   Supine to sit: Mod assist;+2 for physical assistance     General bed mobility comments: Verbal and tactile cues for technique. Cues for using rail to assist with rolling.  Assist to bring LE's off of bed, and to raise trunk to sitting.  Initially patient with poor sitting balance, with posterior lean.  Assist to scoot to EOB and to shift weight forward over hips.  Able to sit with min guard assist.  Transfers Overall transfer level: Needs assistance Equipment used: Rolling walker (2 wheeled) Transfers: Sit to/from [employer] Sit to Stand: Mod assist;+2 physical assistance Stand pivot transfers: Mod assist;+2 physical assistance       General transfer comment: Verbal cues for hand placement and technique.  Attempted to stand - unable to raise hips from bed.  On second attempt, required +2 mod assist to  power up to standing and for balance.  Patient able to take several shuffle steps to pivot to chair.  Assist to control descent into chair.  Ambulation/Gait                Stairs            Wheelchair Mobility    Modified Rankin (Stroke Patients Only)       Balance Overall balance assessment: Needs assistance Sitting-balance support: Bilateral upper extremity supported;Feet supported Sitting balance-Leahy Scale: Poor Sitting balance - Comments: Required UE support, with posterior lean Postural control: Posterior lean Standing balance support: Bilateral upper extremity supported Standing balance-Leahy Scale: Poor                               Pertinent Vitals/Pain Pain Assessment: No/denies pain    Home Living Family/patient expects to be discharged to:: Private residence Living Arrangements: Spouse/significant other Available Help at Discharge: Family;Available 24 hours/day (wife and son) Type of Home: House Home Access: Ramped entrance     Home Layout: One level Home Equipment: [place] - 2 wheels;Cane - single point;Shower seat;Wheelchair - Education officer, community - power (Elevated toilets)      Prior Function Level of Independence: Independent with assistive device(s)         Comments: Patient using cane for ambulation     Hand Dominance        Extremity/Trunk Assessment   Upper Extremity Assessment: Overall WFL for tasks assessed           Lower Extremity Assessment: Generalized weakness;LLE deficits/detail  LLE Deficits / Details: Strength grossly 4-5 with decreased sensation.  Cervical / Trunk Assessment: Normal  Communication   Communication: No difficulties  Cognition Arousal/Alertness: Awake/alert Behavior During Therapy: WFL for tasks assessed/performed Overall Cognitive Status: Impaired/Different from baseline Area of Impairment: Orientation;Memory;Problem solving Orientation Level: Disoriented to;Time   Memory:  Decreased short-term memory       Problem Solving: Slow processing;Decreased initiation;Difficulty sequencing;Requires verbal cues General Comments: Wife reports cognition is improving, but not yet at baseline.  Patient having difficulty sequencing supine to sit, requiring verbal and tactile cues.      General Comments      Exercises        Assessment/Plan    PT Assessment Patient needs continued PT services  PT Diagnosis Difficulty walking;Generalized weakness;Altered mental status   PT Problem List Decreased strength;Decreased activity tolerance;Decreased balance;Decreased mobility;Decreased cognition;Decreased knowledge of use of DME;Obesity  PT Treatment Interventions DME instruction;Gait training;Functional mobility training;Therapeutic activities;Therapeutic exercise;Cognitive remediation;Patient/family education   PT Goals (Current goals can be found in the Care Plan section) Acute Rehab PT Goals Patient Stated Goal: To get stronger PT Goal Formulation: With patient/family Time For Goal Achievement: 03/29/15 Potential to Achieve Goals: Good    Frequency Min 3X/week   Barriers to discharge        Co-evaluation               End of Session Equipment Utilized During Treatment: Gait belt Activity Tolerance: Patient limited by fatigue Patient left: in chair;with call bell/phone within reach;with family/visitor present Nurse Communication: Mobility status         Time: 9563-8756 PT Time Calculation (min) (ACUTE ONLY): 28 min   Charges:   PT Evaluation $Initial PT Evaluation Tier I: 1 Procedure PT Treatments $Therapeutic Activity: 8-22 mins   PT G Codes:        Randall Wilkins 03-24-2015, 3:01 PM Randall Wilkins, Randall Wilkins Pager [phone]

## 2015-03-22 NOTE — Progress Notes (Signed)
Speech Language Pathology  Patient Details Name: Randall Wilkins MRN: [id number] DOB: [birth year] Today's Date: 03/22/2015 Time:  -       Order received. Will assess swallow next date.  Orbie Pyo [place].Ed [employer] [phone]

## 2015-03-22 NOTE — Progress Notes (Signed)
INITIAL NUTRITION ASSESSMENT  DOCUMENTATION CODES Per approved criteria  -Obesity Unspecified   INTERVENTION: - Glucerna Shake po PRN, each supplement provides 220 kcal and 10 grams of protein  NUTRITION DIAGNOSIS: Inadequate oral intake related to poor appetite as evidenced by poor po.   Goal: Pt to meet >/= 90% of their estimated nutrition needs   Monitor:  Weight trend, po intake, labs  Reason for Assessment: Malnutrition Screening Tool  74 y.o. male  Admitting Dx: UTI (urinary tract infection)  ASSESSMENT: 74 y.o. male with a PMH of stroke, MI, CAD, DM, Rhabdomyolysis, short term memory loss and hypertension who presents today with 2-3 days of increased confusion, generalized weakness and increased urinary frequency.   - Pt asleep during RD visit. Nutritional history obtained from chart history and pt's wife.  - Pt's wife reports that pt's appetite has been poor for the past 3 days. He has only been eating bites of food since admission to [facility]. Meal completion recorded as 75%.  - No recent weight loss - RD to order PRN supplements if pt consumes <50% of meals.  - No signs of fat or muscle depletion.  - Labs and medications reviewed.   Height: Ht Readings from Last 1 Encounters:  03/21/15 5\' 9"  (1.753 m)    Weight: Wt Readings from Last 1 Encounters:  03/21/15 258 lb 4.8 oz (117.164 kg)    Ideal Body Weight: 70.7 kg  % Ideal Body Weight: 166%  Wt Readings from Last 10 Encounters:  03/21/15 258 lb 4.8 oz (117.164 kg)  12/11/14 260 lb (117.935 kg)  11/14/14 257 lb 1.6 oz (116.62 kg)  09/10/14 258 lb (117.028 kg)  08/06/14 256 lb (116.121 kg)  05/17/14 258 lb 1.6 oz (117.073 kg)  03/08/14 263 lb (119.296 kg)  11/14/13 265 lb 3.2 oz (120.294 kg)  09/06/13 265 lb (120.203 kg)  03/06/13 265 lb (120.203 kg)    Usual Body Weight: 260 lbs  % Usual Body Weight: 99%  BMI:  Body mass index is 38.13 kg/(m^2).  Estimated Nutritional Needs: Kcal:  2100-2300 Protein: 140-150 g Fluid: 2.1-2.3 L/day  Skin: intact  Diet Order: Diet heart healthy/carb modified Room service appropriate?: Yes; Fluid consistency:: Thin  EDUCATION NEEDS: -Education needs addressed   Intake/Output Summary (Last 24 hours) at 03/22/15 1350 Last data filed at 03/22/15 0900  Gross per 24 hour  Intake    240 ml  Output      0 ml  Net    240 ml    Last BM: 4/8   Labs:   Recent Labs Lab 03/21/15 1326 03/22/15 0420  NA 136 136  K 3.9 4.0  CL 99 102  CO2 24 26  BUN 27* 38*  CREATININE 2.06* 2.38*  CALCIUM 8.6 8.3*  GLUCOSE 299* 187*    CBG (last 3)   Recent Labs  03/21/15 2244 03/22/15 0744 03/22/15 1126  GLUCAP 158* 194* 164*    Scheduled Meds: . amitriptyline  50 mg Oral QHS  . amLODipine  5 mg Oral Daily  . aspirin EC  325 mg Oral Daily  . cefTRIAXone (ROCEPHIN)  IV  1 g Intravenous Q24H  . clopidogrel  75 mg Oral Daily  . donepezil  10 mg Oral Daily  . insulin aspart  0-20 Units Subcutaneous TID WC  . insulin aspart  0-5 Units Subcutaneous QHS  . insulin aspart  15 Units Subcutaneous TID WC  . insulin glargine  50 Units Subcutaneous q morning - 10a  . metoprolol  succinate  25 mg Oral Daily  . omega-3 acid ethyl esters  1 g Oral BID  . rosuvastatin  20 mg Oral Daily  . tamsulosin  0.4 mg Oral QHS    Continuous Infusions: . sodium chloride 50 mL/hr at 03/21/15 1812    Past Medical History  Diagnosis Date  . Rhabdomyolysis   . Memory loss   . Exogenous obesity   . Insulin dependent diabetes mellitus   . Peripheral neuropathy   . Venous insufficiency   . CAD (coronary artery disease)   . Hyperlipidemia   . Gout   . Stroke     L patietal with small scattered lacunar infarcts  . Left foot drop   . Hypertension   . History of nuclear stress test 07/2011    dipyridamole; fixed inferolateral defect, worse at stress than rest; no reversible ischemia; low risk scan   . OSA on CPAP     uses a cpap  . Heart attack      Past Surgical History  Procedure Laterality Date  . Cholecystectomy    . Sinus endo w/fusion    . Coronary angioplasty  10/13/1996  . Rotator cuff repair Right   . Transthoracic echocardiogram  08/08/2013    EF 55-60%, mild conc hypertrophy, grade 1 diastolic dysfunction; AV with mild stenosis; LA & RA mildly dilated  . Coronary angioplasty  09/21/1989    emergency PTCA  . Coronary angioplasty  10/13/1996    Multi-Link diagonal & OD stenting (Dr. Marella Chimes)  . Coronary angioplasty  12/03/1997    disease of mid DX-1 ~50% & in mid PLA & PDA (distal lesions) (Dr. Marella Chimes)   . Coronary angioplasty  10/14/1999    progression of disease distal PLA & PDA; progression of disease prox RCA - moderate (Dr. Marella Chimes)   . Coronary angioplasty with stent placement  04/04/2004    4.0x67mm non-DES (thrombectomy via AngioJet) to RCA for high grade stenosis (Dr. Marella Chimes)  . Carotid doppler  03/2013    bilat bulb/prox ICAs - mild amount of fibrous plaque with no evidence of diameter reduction  . Back surgery  2002    lumbosacral  . Tonsillectomy    . Colonoscopy    . Carpal tunnel release Bilateral 08/09/2014    Procedure: BILATERAL CARPAL TUNNEL RELEASE;  Surgeon: Daryll Brod, MD;  Location: [place];  Service: Orthopedics;  Laterality: Bilateral;  ANESTHESIA:  IV REGIONAL BIL FAB    Laurette Schimke [place], [place], [place]

## 2015-03-22 NOTE — Progress Notes (Signed)
*  PRELIMINARY RESULTS* Echocardiogram 2D Echocardiogram has been performed.  Randall Wilkins 03/22/2015, 11:02 AM

## 2015-03-22 NOTE — Progress Notes (Signed)
Utilization Chart  Review completed.

## 2015-03-23 ENCOUNTER — Inpatient Hospital Stay (HOSPITAL_COMMUNITY): Payer: Medicare Other

## 2015-03-23 DIAGNOSIS — I519 Heart disease, unspecified: Secondary | ICD-10-CM

## 2015-03-23 DIAGNOSIS — G4733 Obstructive sleep apnea (adult) (pediatric): Secondary | ICD-10-CM

## 2015-03-23 DIAGNOSIS — R0602 Shortness of breath: Secondary | ICD-10-CM

## 2015-03-23 LAB — COMPREHENSIVE METABOLIC PANEL
ALT: 22 U/L (ref 0–53)
AST: 67 U/L — ABNORMAL HIGH (ref 0–37)
Albumin: 2.6 g/dL — ABNORMAL LOW (ref 3.5–5.2)
Alkaline Phosphatase: 64 U/L (ref 39–117)
Anion gap: 15 (ref 5–15)
BUN: 49 mg/dL — ABNORMAL HIGH (ref 6–23)
CO2: 22 mmol/L (ref 19–32)
Calcium: 8.3 mg/dL — ABNORMAL LOW (ref 8.4–10.5)
Chloride: 98 mmol/L (ref 96–112)
Creatinine, Ser: 2.44 mg/dL — ABNORMAL HIGH (ref 0.50–1.35)
GFR calc Af Amer: 28 mL/min — ABNORMAL LOW (ref >90)
GFR calc non Af Amer: 25 mL/min — ABNORMAL LOW (ref >90)
Glucose, Bld: 205 mg/dL — ABNORMAL HIGH (ref 70–99)
Potassium: 3.9 mmol/L (ref 3.5–5.1)
Sodium: 135 mmol/L (ref 135–145)
Total Bilirubin: 1.1 mg/dL (ref 0.3–1.2)
Total Protein: 5.9 g/dL — ABNORMAL LOW (ref 6.0–8.3)

## 2015-03-23 LAB — URINE MICROSCOPIC-ADD ON

## 2015-03-23 LAB — BLOOD GAS, ARTERIAL
Acid-Base Excess: 1.2 mmol/L (ref 0.0–2.0)
Acid-Base Excess: 0.7 mmol/L (ref 0.0–2.0)
Bicarbonate: 25.6 mEq/L — ABNORMAL HIGH (ref 20.0–24.0)
Bicarbonate: 24.5 mEq/L — ABNORMAL HIGH (ref 20.0–24.0)
Delivery systems: POSITIVE
Drawn by: 43098
Drawn by: 257081
Expiratory PAP: 8
FIO2: 1 %
FIO2: 0.6 %
Inspiratory PAP: 16
Mode: POSITIVE
O2 Saturation: 93.3 %
O2 Saturation: 96.6 %
Patient temperature: 98.6
Patient temperature: 100.6
TCO2: 26.9 mmol/L (ref 0–100)
TCO2: 25.7 mmol/L (ref 0–100)
pCO2 arterial: 42.9 mmHg (ref 35.0–45.0)
pCO2 arterial: 39.9 mmHg (ref 35.0–45.0)
pH, Arterial: 7.394 (ref 7.350–7.450)
pH, Arterial: 7.411 (ref 7.350–7.450)
pO2, Arterial: 74.1 mmHg — ABNORMAL LOW (ref 80.0–100.0)
pO2, Arterial: 98.2 mmHg (ref 80.0–100.0)

## 2015-03-23 LAB — GLUCOSE, CAPILLARY
Glucose-Capillary: 186 mg/dL — ABNORMAL HIGH (ref 70–99)
Glucose-Capillary: 228 mg/dL — ABNORMAL HIGH (ref 70–99)
Glucose-Capillary: 186 mg/dL — ABNORMAL HIGH (ref 70–99)
Glucose-Capillary: 216 mg/dL — ABNORMAL HIGH (ref 70–99)

## 2015-03-23 LAB — URINALYSIS, ROUTINE W REFLEX MICROSCOPIC
Bilirubin Urine: NEGATIVE
Glucose, UA: NEGATIVE mg/dL
Ketones, ur: 15 mg/dL — AB
Nitrite: NEGATIVE
Protein, ur: 100 mg/dL — AB
Specific Gravity, Urine: 1.016 (ref 1.005–1.030)
Urobilinogen, UA: 0.2 mg/dL (ref 0.0–1.0)
pH: 5 (ref 5.0–8.0)

## 2015-03-23 LAB — CBC
HCT: 35.1 % — ABNORMAL LOW (ref 39.0–52.0)
HCT: 38.5 % — ABNORMAL LOW (ref 39.0–52.0)
Hemoglobin: 11.6 g/dL — ABNORMAL LOW (ref 13.0–17.0)
Hemoglobin: 12.8 g/dL — ABNORMAL LOW (ref 13.0–17.0)
MCH: 29.7 pg (ref 26.0–34.0)
MCH: 30 pg (ref 26.0–34.0)
MCHC: 33 g/dL (ref 30.0–36.0)
MCHC: 33.2 g/dL (ref 30.0–36.0)
MCV: 90 fL (ref 78.0–100.0)
MCV: 90.4 fL (ref 78.0–100.0)
Platelets: 109 10*3/uL — ABNORMAL LOW (ref 150–400)
Platelets: 114 10*3/uL — ABNORMAL LOW (ref 150–400)
RBC: 3.9 MIL/uL — ABNORMAL LOW (ref 4.22–5.81)
RBC: 4.26 MIL/uL (ref 4.22–5.81)
RDW: 15.1 % (ref 11.5–15.5)
RDW: 15.1 % (ref 11.5–15.5)
WBC: 16.7 10*3/uL — ABNORMAL HIGH (ref 4.0–10.5)
WBC: 12 10*3/uL — ABNORMAL HIGH (ref 4.0–10.5)

## 2015-03-23 LAB — URINE CULTURE
Colony Count: >=100000
Colony Count: NO GROWTH
Culture: NO GROWTH

## 2015-03-23 LAB — SODIUM, URINE, RANDOM: Sodium, Ur: 21 mmol/L

## 2015-03-23 LAB — HEMOGLOBIN A1C
Hgb A1c MFr Bld: 7.7 % — ABNORMAL HIGH (ref 4.8–5.6)
Mean Plasma Glucose: 174 mg/dL

## 2015-03-23 LAB — CREATININE, URINE, RANDOM: Creatinine, Urine: 129.97 mg/dL

## 2015-03-23 LAB — TROPONIN I
Troponin I: 9.21 ng/mL (ref <0.031)
Troponin I: 23.54 ng/mL (ref <0.031)

## 2015-03-23 LAB — HEPARIN LEVEL (UNFRACTIONATED): Heparin Unfractionated: 0.11 IU/mL — ABNORMAL LOW (ref 0.30–0.70)

## 2015-03-23 LAB — MRSA PCR SCREENING: MRSA by PCR: NEGATIVE

## 2015-03-23 LAB — OSMOLALITY, URINE: Osmolality, Ur: 419 mOsm/kg (ref 390–1090)

## 2015-03-23 LAB — BRAIN NATRIURETIC PEPTIDE: B Natriuretic Peptide: 733.8 pg/mL — ABNORMAL HIGH (ref 0.0–100.0)

## 2015-03-23 LAB — LACTIC ACID, PLASMA: Lactic Acid, Venous: 1.3 mmol/L (ref 0.5–2.0)

## 2015-03-23 LAB — OSMOLALITY: Osmolality: 305 mOsm/kg — ABNORMAL HIGH (ref 275–300)

## 2015-03-23 MED ORDER — ALBUTEROL SULFATE (2.5 MG/3ML) 0.083% IN NEBU
2.5000 mg | INHALATION_SOLUTION | RESPIRATORY_TRACT | Status: DC | PRN
  Administered 2015-03-23: 2.5 mg via RESPIRATORY_TRACT
  Filled 2015-03-23: qty 3

## 2015-03-23 MED ORDER — FUROSEMIDE 10 MG/ML IJ SOLN
40.0000 mg | Freq: Once | INTRAMUSCULAR | Status: AC
  Administered 2015-03-23: 40 mg via INTRAVENOUS
  Filled 2015-03-23: qty 4

## 2015-03-23 MED ORDER — HEPARIN (PORCINE) IN NACL 100-0.45 UNIT/ML-% IJ SOLN
1300.0000 [IU]/h | INTRAMUSCULAR | Status: DC
  Administered 2015-03-23: 1000 [IU]/h via INTRAVENOUS
  Filled 2015-03-23 (×2): qty 250

## 2015-03-23 MED ORDER — PIPERACILLIN-TAZOBACTAM 3.375 G IVPB
3.3750 g | Freq: Three times a day (TID) | INTRAVENOUS | Status: DC
  Administered 2015-03-23 – 2015-03-29 (×18): 3.375 g via INTRAVENOUS
  Filled 2015-03-23 (×24): qty 50

## 2015-03-23 MED ORDER — HEPARIN BOLUS VIA INFUSION
2900.0000 [IU] | Freq: Once | INTRAVENOUS | Status: AC
  Administered 2015-03-23: 2900 [IU] via INTRAVENOUS
  Filled 2015-03-23: qty 2900

## 2015-03-23 MED ORDER — INSULIN GLARGINE 100 UNIT/ML ~~LOC~~ SOLN
20.0000 [IU] | Freq: Two times a day (BID) | SUBCUTANEOUS | Status: DC
  Administered 2015-03-23 – 2015-03-28 (×10): 20 [IU] via SUBCUTANEOUS
  Filled 2015-03-23 (×14): qty 0.2

## 2015-03-23 MED ORDER — VANCOMYCIN HCL 10 G IV SOLR
1250.0000 mg | INTRAVENOUS | Status: DC
  Administered 2015-03-23 – 2015-03-24 (×2): 1250 mg via INTRAVENOUS
  Filled 2015-03-23 (×3): qty 1250

## 2015-03-23 MED ORDER — HEPARIN BOLUS VIA INFUSION
4000.0000 [IU] | Freq: Once | INTRAVENOUS | Status: AC
  Administered 2015-03-23: 4000 [IU] via INTRAVENOUS
  Filled 2015-03-23: qty 4000

## 2015-03-23 MED ORDER — INSULIN ASPART 100 UNIT/ML ~~LOC~~ SOLN
0.0000 [IU] | SUBCUTANEOUS | Status: DC
  Administered 2015-03-23 (×2): 7 [IU] via SUBCUTANEOUS
  Administered 2015-03-23 (×2): 4 [IU] via SUBCUTANEOUS
  Administered 2015-03-24: 3 [IU] via SUBCUTANEOUS
  Administered 2015-03-24: 7 [IU] via SUBCUTANEOUS
  Administered 2015-03-24: 4 [IU] via SUBCUTANEOUS
  Administered 2015-03-24: 7 [IU] via SUBCUTANEOUS
  Administered 2015-03-24 (×2): 4 [IU] via SUBCUTANEOUS

## 2015-03-23 MED ORDER — SODIUM CHLORIDE 0.9 % IV SOLN
INTRAVENOUS | Status: AC
  Administered 2015-03-23: 75 mL/h via INTRAVENOUS

## 2015-03-23 NOTE — Progress Notes (Signed)
CRITICAL VALUE ALERT  Critical value received:  Troponin 9.21  Date of notification:  03/23/15  Time of notification:  11:58  Critical value read back:Yes.    Nurse who received alert:  Zigmund Gottron   MD notified (1st page):  Ronnie Derby Time of first page:  12:00  MD notified (2nd page):  Time of second page:  Responding MD: Candiss Norse  Time MD responded:  12:05

## 2015-03-23 NOTE — Progress Notes (Signed)
Patient taken off BiPAP per MD at this time and placed on NRB. RT will continue to monitor.

## 2015-03-23 NOTE — Progress Notes (Signed)
ANTIBIOTIC CONSULT NOTE - INITIAL  Pharmacy Consult for Vanc/Zosyn Indication: rule out sepsis and aspiration penumonia  Allergies  Allergen Reactions  . Actos [Pioglitazone] Swelling  . Metformin And Related Nausea Only  . Niaspan [Niacin Er] Itching and Rash    Patient Measurements: Height: 5\' 9"  (175.3 cm) Weight: 258 lb 4.8 oz (117.164 kg) IBW/kg (Calculated) : 70.7  Vital Signs: Temp: 101.2 F (38.4 C) (04/09 0559) Temp Source: Axillary (04/09 0559) BP: 115/48 mmHg (04/09 0748) Pulse Rate: 88 (04/09 0748) Intake/Output from previous day: 04/08 0701 - 04/09 0700 In: 1480 [P.O.:240; I.V.:1190; IV Piggyback:50] Out: 750 [Urine:750] Intake/Output from this shift:    Labs:  Recent Labs  03/21/15 1326 03/22/15 0420 03/22/15 1949  WBC 18.4* 16.7*  --   HGB 13.3 11.6*  --   PLT 107* 109*  --   LABCREA  --   --  159.56  CREATININE 2.06* 2.38*  --    Estimated Creatinine Clearance: 34.4 mL/min (by C-G formula based on Cr of 2.38). No results for input(s): VANCOTROUGH, VANCOPEAK, VANCORANDOM, GENTTROUGH, GENTPEAK, GENTRANDOM, TOBRATROUGH, TOBRAPEAK, TOBRARND, AMIKACINPEAK, AMIKACINTROU, AMIKACIN in the last 72 hours.   Microbiology: Recent Results (from the past 720 hour(s))  Urine culture     Status: None (Preliminary result)   Collection Time: 03/21/15  3:02 PM  Result Value Ref Range Status   Specimen Description URINE, CLEAN CATCH  Final   Special Requests NONE  Final   Colony Count   Final    >=100,000 COLONIES/ML Performed at [facility]    Culture   Final    ESCHERICHIA COLI Performed at [facility]    Report Status PENDING  Incomplete  Urine culture     Status: None   Collection Time: 03/21/15  6:00 PM  Result Value Ref Range Status   Specimen Description URINE, RANDOM  Final   Special Requests NONE  Final   Colony Count NO GROWTH Performed at [facility]   Final   Culture NO GROWTH Performed at [facility]   Final   Report Status 03/23/2015 FINAL  Final  Clostridium Difficile by PCR     Status: None   Collection Time: 03/21/15  9:33 PM  Result Value Ref Range Status   C difficile by pcr NEGATIVE NEGATIVE Final    Medical History: Past Medical History  Diagnosis Date  . Rhabdomyolysis   . Memory loss   . Exogenous obesity   . Insulin dependent diabetes mellitus   . Peripheral neuropathy   . Venous insufficiency   . CAD (coronary artery disease)   . Hyperlipidemia   . Gout   . Stroke     L patietal with small scattered lacunar infarcts  . Left foot drop   . Hypertension   . History of nuclear stress test 07/2011    dipyridamole; fixed inferolateral defect, worse at stress than rest; no reversible ischemia; low risk scan   . OSA on CPAP     uses a cpap  . Heart attack     Medications:  Scheduled:  . amitriptyline  50 mg Oral QHS  . amLODipine  5 mg Oral Daily  . aspirin EC  325 mg Oral Daily  . clopidogrel  75 mg Oral Daily  . donepezil  10 mg Oral Daily  . insulin aspart  0-20 Units Subcutaneous Q4H  . insulin glargine  20 Units Subcutaneous BID  . metoprolol succinate  25 mg Oral Daily  . omega-3 acid  ethyl esters  1 g Oral BID  . rosuvastatin  20 mg Oral Daily  . tamsulosin  0.4 mg Oral QHS   Assessment: 74yoM presented to the ED on 4/7 w/ generalized weakness and confusion. Pt also complaining of urinary frequency which he recently went to the MD for and was prescribed flomax, without relief. Dx w. UTI at PCP office, told to come to ED for IV abx. CTX started. Pt cyanotic w/ difficulty breathing this AM, WBC 16.7, T 101.2. Pharmacy consulted to broaden abx coverage to vanc and zosyn for possible sepsis/aspiration pneumonia. SCr trending up, now 2.38, BL 1.5. UOP ok 0.57mL/kg/hr  Goal of Therapy:  Vancomycin trough level 15-20 mcg/ml  Plan:  Vanc 1250mg  q24h Zosyn 3.375g IV q8h, EI Montior renal function, cultures, and clinical progress  Obtain VT at [facility] if clinical  warranted  Thank you for allowing pharmacy to be part of this patient's care team  [place], Pharm.D Clinical Pharmacy Resident Pager: [phone] 03/23/2015 .8:29 AM

## 2015-03-23 NOTE — Progress Notes (Signed)
RN called me urgently to assess patient. She stated that the pt had his BIPAP mask off two times during the night. Upon my arrival, pt was tachyphemic, diaphoretic, and confused. She had put him in a PRB mask. His SAT was 95, RR 40, BS dec and clear, HR 94 . ABG drawn and sent per verbal order. I asked RN to call a rapid response, as pts HR dropped to 20's, LOC is a bit worse worse. I remained at bedside until RRT team arrived.

## 2015-03-23 NOTE — Consult Note (Addendum)
CARDIOLOGY CONSULT NOTE   Patient ID: Randall Wilkins MRN: [id number], DOB/AGE: [birth year]   Admit date: 03/21/2015 Date of Consult: 03/23/2015   Primary Physician: Simona Huh, MD Primary Cardiologist: Dr. Debara Pickett  Pt. Profile  74 year old gentleman with known coronary artery disease and multiple previous PCI's admitted with confusion and weakness and urinary tract infection.  This morning had an episode of acute respiratory distress and subsequent troponin is elevated.  Problem List  Past Medical History  Diagnosis Date  . Rhabdomyolysis   . Memory loss   . Exogenous obesity   . Insulin dependent diabetes mellitus   . Peripheral neuropathy   . Venous insufficiency   . CAD (coronary artery disease)   . Hyperlipidemia   . Gout   . Stroke     L patietal with small scattered lacunar infarcts  . Left foot drop   . Hypertension   . History of nuclear stress test 07/2011    dipyridamole; fixed inferolateral defect, worse at stress than rest; no reversible ischemia; low risk scan   . OSA on CPAP     uses a cpap  . Heart attack     Past Surgical History  Procedure Laterality Date  . Cholecystectomy    . Sinus endo w/fusion    . Coronary angioplasty  10/13/1996  . Rotator cuff repair Right   . Transthoracic echocardiogram  08/08/2013    EF 55-60%, mild conc hypertrophy, grade 1 diastolic dysfunction; AV with mild stenosis; LA & RA mildly dilated  . Coronary angioplasty  09/21/1989    emergency PTCA  . Coronary angioplasty  10/13/1996    Multi-Link diagonal & OD stenting (Dr. Marella Chimes)  . Coronary angioplasty  12/03/1997    disease of mid DX-1 ~50% & in mid PLA & PDA (distal lesions) (Dr. Marella Chimes)   . Coronary angioplasty  10/14/1999    progression of disease distal PLA & PDA; progression of disease prox RCA - moderate (Dr. Marella Chimes)   . Coronary angioplasty with stent placement  04/04/2004    4.0x20mm non-DES (thrombectomy via AngioJet) to RCA for high grade  stenosis (Dr. Marella Chimes)  . Carotid doppler  03/2013    bilat bulb/prox ICAs - mild amount of fibrous plaque with no evidence of diameter reduction  . Back surgery  2002    lumbosacral  . Tonsillectomy    . Colonoscopy    . Carpal tunnel release Bilateral 08/09/2014    Procedure: BILATERAL CARPAL TUNNEL RELEASE;  Surgeon: Daryll Brod, MD;  Location: [place];  Service: Orthopedics;  Laterality: Bilateral;  ANESTHESIA:  IV REGIONAL BIL FAB     Allergies  Allergies  Allergen Reactions  . Actos [Pioglitazone] Swelling  . Metformin And Related Nausea Only  . Niaspan [Niacin Er] Itching and Rash    HPI   This 74 year old gentleman is a cardiology patient of Dr. Debara Pickett.  He is a medical patient of Dr. Donnie Coffin.  His wife took him to see Dr. Alroy Dust on 03/21/15 because of weakness and disorientation.  The patient had fallen earlier that day with development of a large ecchymosis of the right flank.  He was found to have evidence of a severe urinary tract infection and was sent to the hospital for IV antibiotics.  He was stable until this morning when the nurse found him tachypnea And diaphoretic and confused.  Was noted to have bradycardia and cyanosis and his oxygen level off CPAP was 85%.  He was placed  on a nonrebreather with improvement in oxygen saturation.  The patient denies any recent chest pain.  Chest x-ray shows a new area of airspace disease in the right lung suspicious for pneumonia.  May also be an element of interstitial bilateral pulmonary vascular congestion and asymmetric pulmonary edema.  Troponin is drawn and came back elevated at 9.21.  His electrocardiogram taken after the episode this morning showed nonspecific anterolateral ST-T wave changes and first-degree heart block but no evidence of a STEMI. Patient does have known ischemic heart disease.  He has had multiple PCI's by Dr. Rollene Fare, most recently in 2005.  His last ischemic workup was in 2012 and  showed no reversible ischemia and showed a fixed inferolateral defect felt to represent artifact and his ejection fraction was 60% and this was felt to be a low risk scan.  Inpatient Medications  . amitriptyline  50 mg Oral QHS  . amLODipine  5 mg Oral Daily  . aspirin EC  325 mg Oral Daily  . clopidogrel  75 mg Oral Daily  . donepezil  10 mg Oral Daily  . insulin aspart  0-20 Units Subcutaneous Q4H  . insulin glargine  20 Units Subcutaneous BID  . metoprolol succinate  25 mg Oral Daily  . omega-3 acid ethyl esters  1 g Oral BID  . piperacillin-tazobactam (ZOSYN)  IV  3.375 g Intravenous 3 times per day  . rosuvastatin  20 mg Oral Daily  . tamsulosin  0.4 mg Oral QHS  . vancomycin  1,250 mg Intravenous Q24H    Family History Family History  Problem Relation Age of Onset  . Coronary artery disease Father   . Heart disease Mother   . Cancer Maternal Grandmother   . Heart Problems Maternal Grandfather      Social History History   Social History  . Marital Status: Married    Spouse Name: N/A  . Number of Children: 3  . Years of Education: 12   Occupational History  . Not on file.   Social History Main Topics  . Smoking status: Former Smoker -- 2.00 packs/day for 20 years    Types: Cigarettes    Quit date: 01/25/1973  . Smokeless tobacco: Never Used  . Alcohol Use: No  . Drug Use: No  . Sexual Activity: Not on file   Other Topics Concern  . Not on file   Social History Narrative     Review of Systems  General:  No chills, fever, night sweats or weight changes.  Cardiovascular:  No chest pain, dyspnea on exertion, edema, orthopnea, palpitations, paroxysmal nocturnal dyspnea. Dermatological: No rash, lesions/masses Respiratory: No cough, dyspnea Urologic: Recent urinary tract infection with urgency and frequency and confusion. Abdominal:   No nausea, vomiting, diarrhea, bright red blood per rectum, melena, or hematemesis Neurologic:  No visual changes, wkns,  changes in mental status. All other systems reviewed and are otherwise negative except as noted above.  Physical Exam  Blood pressure 126/74, pulse 76, temperature 99.2 F (37.3 C), temperature source Oral, resp. rate 32, height 5\' 9"  (1.753 m), weight 258 lb 4.8 oz (117.164 kg), SpO2 98 %.  General: Pleasant, NAD Psych: Normal affect. Neuro: Alert and oriented X 3. Moves all extremities spontaneously. HEENT: Normal  Neck: Supple without bruits .  Jugular venous pressure elevated Lungs:  Bilateral wheezes and rhonchi Heart: RRR no s3, s4, or murmurs. Abdomen: Soft, non-tender, non-distended, BS + x 4.  Extremities: No clubbing, cyanosis or edema. DP/PT/Radials 2+ and equal  bilaterally.  Labs   Recent Labs  03/23/15 1010  TROPONINI 9.21*   Lab Results  Component Value Date   WBC 12.0* 03/23/2015   HGB 12.8* 03/23/2015   HCT 38.5* 03/23/2015   MCV 90.4 03/23/2015   PLT 114* 03/23/2015     Recent Labs Lab 03/23/15 1018  NA 135  K 3.9  CL 98  CO2 22  BUN 49*  CREATININE 2.44*  CALCIUM 8.3*  PROT 5.9*  BILITOT 1.1  ALKPHOS 64  ALT 22  AST 67*  GLUCOSE 205*   No results found for: CHOL, HDL, LDLCALC, TRIG No results found for: DDIMER  Radiology/Studies  X-ray Chest Pa And Lateral  03/22/2015   CLINICAL DATA:  Shortness of breath  EXAM: CHEST  2 VIEW  COMPARISON:  11/14/2009  FINDINGS: No cardiomegaly for technique. Aortic and hilar contours are negative. 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 No edema, consolidation, effusion, or pneumothorax.  IMPRESSION: No active cardiopulmonary disease.   Electronically Signed   By: Monte Fantasia M.D.   On: 03/22/2015 01:10   Dg Esophagus  02/27/2015   CLINICAL DATA:  Dysphagia  EXAM: ESOPHOGRAM / BARIUM SWALLOW / BARIUM TABLET STUDY  TECHNIQUE: Combined double contrast and single contrast examination performed using effervescent crystals, thick barium liquid, and thin barium liquid. The patient  was observed with fluoroscopy swallowing a 47mm barium sulphate tablet.  FLUOROSCOPY TIME:  4 minutes 0 seconds  COMPARISON:  None  FINDINGS: No cervical esophageal narrowing. Cervical plate noted from anterior fusion at C3-4. Separate plate at D5-3.  Fluoroscopic evaluation of swallowing demonstrates disruption of 3 out of 4 primary esophageal peristaltic waves with moderate tertiary contractions. No fold thickening.  There is mild distal esophageal short segment stricture, best seen on the upright views and after administration of the barium tablet. The barium tablet sticks for several minutes in the distal esophagus. The tablet also initially sticks to the right of midline in the cervical esophagus, reproduce in the patient's symptoms. This is presumably within the right piriform sinus. The barium tablet finally passes into the esophagus but lodges distally in area of mild distal esophageal stricture.  IMPRESSION: The barium tablet initially lodges in the right piriform sinus, reproducing the patient's symptoms. This finally passes into the esophagus, but lodges in the distal esophagus at an area of mild short segment distal esophageal stricture.  Esophageal dysmotility with disruption of primary peristaltic waves an moderate tertiary contractions.   Electronically Signed   By: Rolm Baptise M.D.   On: 02/27/2015 12:38   Dg Chest Port 1 View  03/23/2015   CLINICAL DATA:  Shortness of breath.  EXAM: PORTABLE CHEST - 1 VIEW  COMPARISON:  03/21/2015  FINDINGS: Lungs are hypoinflated and demonstrate a new moderate airspace process over the central right lung. No evidence of effusion or pneumothorax. Cardiomediastinal silhouette and remainder of the exam is unchanged.  IMPRESSION: New moderate airspace process over the central right lung likely infection and less likely asymmetric edema.   Electronically Signed   By: Marin Olp M.D.   On: 03/23/2015 08:12    ECG  Normal sinus rhythm with first-degree AV  block.  ST and T-wave abnormalities consistent with lateral ischemia.  Since the previous tracing of 03/21/15, no significant change.  Personally reviewed  ASSESSMENT AND PLAN  1.  Non-STEMI. 2.  History of known ischemic heart disease with remote bare-metal stent to the diagonal in 1997 and bare-metal stent to the right  coronary artery in 2005 3.  Essential hypertension 4.  Dyslipidemia 5.  Diabetes with diabetic neuropathy 6.  Memory disorder 7.  Acute respiratory insufficiency early this morning with marked tachypnea and diaphoresis.  This may have represented aspiration pneumonia.  However cannot also exclude the possibility of asymmetric pulmonary edema with rales.  At home the patient has been on chronic torsemide 50 mg daily, last dose Wednesday.  Plan: Serial troponins.  IV heparin has already been started.  The patient has not been experiencing any chest pain.  He is not currently a candidate for cardiac catheterization because of his renal insufficiency.  We will get follow-up EKGs.  I will give him one dose of IV Lasix now for possibly CHF component to his acute dyspnea.   Will follow with you  Signed, Darlin Coco, MD  03/23/2015, 3:20 PM

## 2015-03-23 NOTE — Consult Note (Addendum)
Referring Provider: Dr. Candiss Norse Primary Care Physician:  Simona Huh, MD Primary Gastroenterologist:  Dr. Oletta Lamas  Reason for Consultation:  Dysphagia  HPI: Randall Wilkins is a 74 y.o. male admitted for a UTI and developed an aspiration pneumonia and non-ST elevated MI and currently is on a non-rebreather after having been on Bipap prior to that. He is on a heparin drip for his MI along with aspirin and Plavix and additional meds per the MI protocol. He is being seen for a GI consult due to dysphagia stating that starting 2 months ago he developed coughing and a strangling sensation when he would eat, drink, or take his pills. His wife says he would complain that he would feel pills stick on the right side of his neck. Denies any trouble swallowing prior to 2 months ago. His wife reports that 10 years ago when he had back surgery his "esophagus was ripped" but he did not have any trouble swallowing until recently. He saw his PCP, Dr. Marisue Humble for the dysphagia on 02/21/15, who sent him for a barium swallow and the barium tablet lodged in the right pyriform sinus reproducing his symptoms and then when the tablet passed it lodged again in the distal esophagus consistent with a short segment distal esophageal stricture. Coughing developed during PO trial by speech path and modified barium swallow ordered. Denies abdominal pain, nausea, or vomiting. Wife and children at bedside.   Past Medical History  Diagnosis Date  . Rhabdomyolysis   . Memory loss   . Exogenous obesity   . Insulin dependent diabetes mellitus   . Peripheral neuropathy   . Venous insufficiency   . CAD (coronary artery disease)   . Hyperlipidemia   . Gout   . Stroke     L patietal with small scattered lacunar infarcts  . Left foot drop   . Hypertension   . History of nuclear stress test 07/2011    dipyridamole; fixed inferolateral defect, worse at stress than rest; no reversible ischemia; low risk scan   . OSA on CPAP      uses a cpap  . Heart attack     Past Surgical History  Procedure Laterality Date  . Cholecystectomy    . Sinus endo w/fusion    . Coronary angioplasty  10/13/1996  . Rotator cuff repair Right   . Transthoracic echocardiogram  08/08/2013    EF 55-60%, mild conc hypertrophy, grade 1 diastolic dysfunction; AV with mild stenosis; LA & RA mildly dilated  . Coronary angioplasty  09/21/1989    emergency PTCA  . Coronary angioplasty  10/13/1996    Multi-Link diagonal & OD stenting (Dr. Marella Chimes)  . Coronary angioplasty  12/03/1997    disease of mid DX-1 ~50% & in mid PLA & PDA (distal lesions) (Dr. Marella Chimes)   . Coronary angioplasty  10/14/1999    progression of disease distal PLA & PDA; progression of disease prox RCA - moderate (Dr. Marella Chimes)   . Coronary angioplasty with stent placement  04/04/2004    4.0x21mm non-DES (thrombectomy via AngioJet) to RCA for high grade stenosis (Dr. Marella Chimes)  . Carotid doppler  03/2013    bilat bulb/prox ICAs - mild amount of fibrous plaque with no evidence of diameter reduction  . Back surgery  2002    lumbosacral  . Tonsillectomy    . Colonoscopy    . Carpal tunnel release Bilateral 08/09/2014    Procedure: BILATERAL CARPAL TUNNEL RELEASE;  Surgeon: Daryll Brod, MD;  Location: [facility];  Service: Orthopedics;  Laterality: Bilateral;  ANESTHESIA:  IV REGIONAL BIL FAB    Prior to Admission medications   Medication Sig Start Date End Date Taking? Authorizing Provider  allopurinol (ZYLOPRIM) 100 MG tablet Take 300 mg by mouth daily.    Yes Historical Provider, MD  amitriptyline (ELAVIL) 50 MG tablet Take 50 mg by mouth at bedtime.   Yes Historical Provider, MD  amLODipine (NORVASC) 5 MG tablet Take 5 mg by mouth daily.   Yes Historical Provider, MD  aspirin EC 81 MG tablet Take 81 mg by mouth daily.   Yes Historical Provider, MD  clopidogrel (PLAVIX) 75 MG tablet Take 75 mg by mouth daily.   Yes Historical Provider, MD   colchicine 0.6 MG tablet Take 0.6 mg by mouth daily as needed.   Yes Historical Provider, MD  donepezil (ARICEPT) 10 MG tablet Take 1 tablet (10 mg total) by mouth daily. 09/10/14  Yes Marcial Pacas, MD  fish oil-omega-3 fatty acids 1000 MG capsule Take 1 g by mouth 2 (two) times daily.    Yes Historical Provider, MD  HYDROcodone-acetaminophen (VICODIN) 5-500 MG per tablet Take 2 tablets by mouth at bedtime.    Yes Historical Provider, MD  Insulin Glargine 300 UNIT/ML SOPN Inject 60 Units into the skin every morning.   Yes Historical Provider, MD  insulin lispro (HUMALOG) 100 UNIT/ML injection Inject 20-25 Units into the skin 4 (four) times daily. Takes 25u in am, 20u at lunch, 20u in evening and 25u at bedtime   Yes Historical Provider, MD  lisinopril (PRINIVIL,ZESTRIL) 5 MG tablet Take 10 mg by mouth daily.    Yes Historical Provider, MD  metoprolol succinate (TOPROL-XL) 25 MG 24 hr tablet Take 25 mg by mouth daily.   Yes Historical Provider, MD  NON FORMULARY Inhale 1 application into the lungs at bedtime. CPAP   Yes Historical Provider, MD  potassium chloride SA (K-DUR,KLOR-CON) 20 MEQ tablet Take 20 mEq by mouth daily.    Yes Historical Provider, MD  rosuvastatin (CRESTOR) 20 MG tablet Take 20 mg by mouth daily.   Yes Historical Provider, MD  Tamsulosin HCl (FLOMAX) 0.4 MG CAPS Take 0.4 mg by mouth daily.    Yes Historical Provider, MD  torsemide (DEMADEX) 10 MG tablet Take 50 mg by mouth daily.   Yes Historical Provider, MD    Scheduled Meds: . amitriptyline  50 mg Oral QHS  . amLODipine  5 mg Oral Daily  . aspirin EC  325 mg Oral Daily  . clopidogrel  75 mg Oral Daily  . donepezil  10 mg Oral Daily  . furosemide  40 mg Intravenous Once  . insulin aspart  0-20 Units Subcutaneous Q4H  . insulin glargine  20 Units Subcutaneous BID  . metoprolol succinate  25 mg Oral Daily  . omega-3 acid ethyl esters  1 g Oral BID  . piperacillin-tazobactam (ZOSYN)  IV  3.375 g Intravenous 3 times per day   . rosuvastatin  20 mg Oral Daily  . tamsulosin  0.4 mg Oral QHS  . vancomycin  1,250 mg Intravenous Q24H   Continuous Infusions: . sodium chloride 75 mL/hr at 03/23/15 0900  . heparin 1,000 Units/hr (03/23/15 1800)   PRN Meds:.acetaminophen **OR** acetaminophen, albuterol, alum & mag hydroxide-simeth, feeding supplement (GLUCERNA SHAKE), ondansetron **OR** ondansetron (ZOFRAN) IV, oxyCODONE  Allergies as of 03/21/2015 - Review Complete 03/21/2015  Allergen Reaction Noted  . Actos [pioglitazone] Swelling 03/21/2015  . Metformin and related  Nausea Only 03/21/2015  . Niaspan [niacin er] Itching and Rash 08/09/2012    Family History  Problem Relation Age of Onset  . Coronary artery disease Father   . Heart disease Mother   . Cancer Maternal Grandmother   . Heart Problems Maternal Grandfather     History   Social History  . Marital Status: Married    Spouse Name: N/A  . Number of Children: 3  . Years of Education: 12   Occupational History  . Not on file.   Social History Main Topics  . Smoking status: Former Smoker -- 2.00 packs/day for 20 years    Types: Cigarettes    Quit date: 01/25/1973  . Smokeless tobacco: Never Used  . Alcohol Use: No  . Drug Use: No  . Sexual Activity: Not on file   Other Topics Concern  . Not on file   Social History Narrative    Review of Systems: All negative from a GI standpoint except as stated above in HPI.  Physical Exam: Vital signs: Filed Vitals:   03/23/15 1715  BP: 136/60  Pulse: 69  Temp: 99.8  Resp: 32   Last BM Date: 03/23/15 General:   Lethargic, Well-developed, well-nourished, pleasant and cooperative, mild acute distress Head: atraumatic and normocephalic Eyes: pupils reactive and anicteric sclerae Neck: supple, nontender Lungs:  Coarse breath sounds Heart:  Regular rate and rhythm; no murmurs, clicks, rubs,  or gallops. Abdomen: soft, nontender, nondistended, +BS  Rectal:  Deferred Ext: no edema Skin: no  rash  GI:  Lab Results:  Recent Labs  03/21/15 1326 03/22/15 0420 03/23/15 1018  WBC 18.4* 16.7* 12.0*  HGB 13.3 11.6* 12.8*  HCT 39.7 35.1* 38.5*  PLT 107* 109* 114*   BMET  Recent Labs  03/21/15 1326 03/22/15 0420 03/23/15 1018  NA 136 136 135  K 3.9 4.0 3.9  CL 99 102 98  CO2 24 26 22   GLUCOSE 299* 187* 205*  BUN 27* 38* 49*  CREATININE 2.06* 2.38* 2.44*  CALCIUM 8.6 8.3* 8.3*   LFT  Recent Labs  03/23/15 1018  PROT 5.9*  ALBUMIN 2.6*  AST 67*  ALT 22  ALKPHOS 64  BILITOT 1.1   PT/INR  Recent Labs  03/21/15 1326  LABPROT 14.5  INR 1.12     Studies/Results: X-ray Chest Pa And Lateral  03/22/2015   CLINICAL DATA:  Shortness of breath  EXAM: CHEST  2 VIEW  COMPARISON:  11/14/2009  FINDINGS: No cardiomegaly for technique. Aortic and hilar contours are negative. 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 No edema, consolidation, effusion, or pneumothorax.  IMPRESSION: No active cardiopulmonary disease.   Electronically Signed   By: Monte Fantasia M.D.   On: 03/22/2015 01:10   Dg Chest Port 1 View  03/23/2015   CLINICAL DATA:  Shortness of breath.  EXAM: PORTABLE CHEST - 1 VIEW  COMPARISON:  03/21/2015  FINDINGS: Lungs are hypoinflated and demonstrate a new moderate airspace process over the central right lung. No evidence of effusion or pneumothorax. Cardiomediastinal silhouette and remainder of the exam is unchanged.  IMPRESSION: New moderate airspace process over the central right lung likely infection and less likely asymmetric edema.   Electronically Signed   By: Marin Olp M.D.   On: 03/23/2015 08:12    Impression/Plan: 74 yo with recent NSTEMI and aspiration pneumonia being seen for a 2 month history of solid and liquid dysphagia. Suspect oropharyngeal component main source of his dysphagia over an  esophageal component. He has a mild short segment distal esophageal stricture that is playing a role with his dysphagia but his  gagging, coughing, and feeling of strangulation suggest an oropharyngeal source from possible aspiration. Agree with modified barium swallow planned by speech path. No role for EGD with recent MI unless modified barium swallow unrevealing and then may need an esophageal dilation prior to discharge to help with PO intake. Will await modified barium swallow and final recs from speech path. Please call us back when that has been finalized. Dr. Watt Climes available next week to see if necessary. Thank you for this consultation.    LOS: 2 days   [place] C.  03/23/2015, 6:42 PM

## 2015-03-23 NOTE — Evaluation (Signed)
Clinical/Bedside Swallow Evaluation Patient Details  Name: Randall Wilkins MRN: [id number] Date of Birth: Dec 27, 1940  Today's Date: 03/23/2015 Time: SLP Start Time (ACUTE ONLY): 1420 SLP Stop Time (ACUTE ONLY): 1435 SLP Time Calculation (min) (ACUTE ONLY): 15 min  Past Medical History:  Past Medical History  Diagnosis Date  . Rhabdomyolysis   . Memory loss   . Exogenous obesity   . Insulin dependent diabetes mellitus   . Peripheral neuropathy   . Venous insufficiency   . CAD (coronary artery disease)   . Hyperlipidemia   . Gout   . Stroke     L patietal with small scattered lacunar infarcts  . Left foot drop   . Hypertension   . History of nuclear stress test 07/2011    dipyridamole; fixed inferolateral defect, worse at stress than rest; no reversible ischemia; low risk scan   . OSA on CPAP     uses a cpap  . Heart attack    Past Surgical History:  Past Surgical History  Procedure Laterality Date  . Cholecystectomy    . Sinus endo w/fusion    . Coronary angioplasty  10/13/1996  . Rotator cuff repair Right   . Transthoracic echocardiogram  08/08/2013    EF 55-60%, mild conc hypertrophy, grade 1 diastolic dysfunction; AV with mild stenosis; LA & RA mildly dilated  . Coronary angioplasty  09/21/1989    emergency PTCA  . Coronary angioplasty  10/13/1996    Multi-Link diagonal & OD stenting (Dr. Marella Chimes)  . Coronary angioplasty  12/03/1997    disease of mid DX-1 ~50% & in mid PLA & PDA (distal lesions) (Dr. Marella Chimes)   . Coronary angioplasty  10/14/1999    progression of disease distal PLA & PDA; progression of disease prox RCA - moderate (Dr. Marella Chimes)   . Coronary angioplasty with stent placement  04/04/2004    4.0x29mm non-DES (thrombectomy via AngioJet) to RCA for high grade stenosis (Dr. Marella Chimes)  . Carotid doppler  03/2013    bilat bulb/prox ICAs - mild amount of fibrous plaque with no evidence of diameter reduction  . Back surgery  2002    lumbosacral   . Tonsillectomy    . Colonoscopy    . Carpal tunnel release Bilateral 08/09/2014    Procedure: BILATERAL CARPAL TUNNEL RELEASE;  Surgeon: Daryll Brod, MD;  Location: [facility];  Service: Orthopedics;  Laterality: Bilateral;  ANESTHESIA:  IV REGIONAL BIL FAB   HPI:  Randall Wilkins is a 74 y.o. male with a PMH of stroke, MI, CAD, DM, Rhabdomyolysis, short term memory loss and hypertension who presents today with 2-3 days of increased confusion, generalized weakness and increased urinary frequency. Wife states for the past 2-3 days she has noticed he has been dragging his feet when he is walking more than usual and also that he normally uses a cane but has had to use a walker during this time. Pt has residual L leg weakness from the rhabdomyolysis 6 years ago but his R leg has had minimal weakness until this current episode. Early this morning pt fell 3 times while he was trying to get to the restroom and the wife had to assist him in standing each time. He denies hitting his head or LOC with each fall. Pt was found to have a large hematoma to his R lateral abdomen this afternoon that was not there yesterday and pt and wife believe it was due to the falls. Pt has had  unexplained daily diarrhea for the past two weeks.Today pt's wife has noticed he has had two small episodes of bowel incontinence which is also new for the patient. Pt was diagnosed prior to arrival in the ED with a UTI by his PCP and was told to come to the ED to have IV abx administered. Pt has also been complaining of worsening exertional dyspnea over the past few days. He denies any cp, abdominal pain, nausea, vomiting, constipation, changes in vision, dizziness. Of note, when patient had his stroke 2 years ago pt was on plavix at the time from his previous MI and stent placement. His wife states his short term memory loss began after the stroke. In the ED patient was found to have a fever, have leukocytosis, mild confusion and  evidence of a UTI. TR[facility] asked for admission for UTI. Current chest x-ray shows bew moderate airspace process over the central right lung likely infection and less likely asymmetric edema.  Of note, the pt had a regular barium swallow 02/27/2015 which showed the following:  barium tablet initially lodges in the right piriform sinus, reproducing the patient's symptoms. This finally passes into the esophagus, but lodges in the distal esophagus at an area of mild   Assessment / Plan / Recommendation Clinical Impression  Clinical evaluation of swallowing was completed.  The pt presents with mild oral/pharyngeal dysphagia characterized by delayed oral transit/manipulation and mildly delayed swallow trigger.  The pt did not present with overt s/s of aspiration across all textures.   Oral stasis was observed for the dry solid.  The pt is currently on a full liquid diet.  Rx advance when appropriate to dysphagia 3 with thin liquids.  ST to follow up for therapeutic diet tolerance and possible diet advancement.      Aspiration Risk  Mild    Diet Recommendation Dysphagia 3 (Mechanical Soft);Thin liquid   Liquid Administration via: Cup;Straw Medication Administration: Crushed with puree Supervision: Staff to assist with self feeding Compensations: Slow rate;Small sips/bites;Follow solids with liquid Postural Changes and/or Swallow Maneuvers: Seated upright 90 degrees;Upright 30-60 min after meal    Other  Recommendations Oral Care Recommendations: Oral care BID   Follow Up Recommendations   (to be determined)    Frequency and Duration min 2x/week  2 weeks    Swallow Study Prior Functional Status       General Date of Onset: 03/21/15 HPI: Randall Wilkins is a 74 y.o. male with a PMH of stroke, MI, CAD, DM, Rhabdomyolysis, short term memory loss and hypertension who presents today with 2-3 days of increased confusion, generalized weakness and increased urinary frequency. Wife states for the past 2-3  days she has noticed he has been dragging his feet when he is walking more than usual and also that he normally uses a cane but has had to use a walker during this time. Pt has residual L leg weakness from the rhabdomyolysis 6 years ago but his R leg has had minimal weakness until this current episode. Early this morning pt fell 3 times while he was trying to get to the restroom and the wife had to assist him in standing each time. He denies hitting his head or LOC with each fall. Pt was found to have a large hematoma to his R lateral abdomen this afternoon that was not there yesterday and pt and wife believe it was due to the falls. Pt has had unexplained daily diarrhea for the past two weeks.Today pt's wife has noticed  he has had two small episodes of bowel incontinence which is also new for the patient. Pt was diagnosed prior to arrival in the ED with a UTI by his PCP and was told to come to the ED to have IV abx administered. Pt has also been complaining of worsening exertional dyspnea over the past few days. He denies any cp, abdominal pain, nausea, vomiting, constipation, changes in vision, dizziness. Of note, when patient had his stroke 2 years ago pt was on plavix at the time from his previous MI and stent placement. His wife states his short term memory loss began after the stroke. In the ED patient was found to have a fever, have leukocytosis, mild confusion and evidence of a UTI. TRH asked for admission for UTI. Current chest x-ray shows bew moderate airspace process over the central right lung likely infection and less likely asymmetric edema.  Of note, the pt had a regular barium swallow 02/27/2015 which showed the following:  barium tablet initially lodges in the right piriform sinus, reproducing the patient's symptoms. This finally passes into the esophagus, but lodges in the distal esophagus at an area of mild Type of Study: Bedside swallow evaluation Previous Swallow Assessment: none noted Diet  Prior to this Study: Regular;Thin liquids Temperature Spikes Noted: Yes Respiratory Status: non-rebreather History of Recent Intubation: No Behavior/Cognition: Alert;Cooperative;Pleasant mood Oral Cavity - Dentition: Adequate natural dentition Self-Feeding Abilities: Needs assist Patient Positioning: Upright in bed Baseline Vocal Quality: Clear Volitional Cough: Strong Volitional Swallow: Able to elicit    Oral/Motor/Sensory Function Overall Oral Motor/Sensory Function: Appears within functional limits for tasks assessed Labial ROM: Within Functional Limits Labial Symmetry: Within Functional Limits Labial Strength: Within Functional Limits Lingual ROM: Within Functional Limits (issues with lingual elevation) Lingual Symmetry: Within Functional Limits Lingual Strength: Within Functional Limits Facial ROM: Within Functional Limits Facial Symmetry: Within Functional Limits Mandible: Within Functional Limits   Ice Chips Ice chips: Not tested   Thin Liquid Thin Liquid: Impaired Presentation: Cup;Spoon;Straw Oral Phase Impairments: Impaired anterior to posterior transit Oral Phase Functional Implications: Prolonged oral transit Pharyngeal  Phase Impairments: Suspected delayed Swallow (mild)    Nectar Thick Nectar Thick Liquid: Not tested   Honey Thick Honey Thick Liquid: Not tested   Puree Puree: Impaired Presentation: Spoon Oral Phase Impairments: Impaired anterior to posterior transit Oral Phase Functional Implications: Prolonged oral transit Pharyngeal Phase Impairments: Suspected delayed Swallow (mild)   Solid   GO    Solid: Impaired Presentation: Self Fed Oral Phase Impairments: Impaired mastication Oral Phase Functional Implications: Oral residue Pharyngeal Phase Impairments: Suspected delayed Swallow (mild)       Shelly Flatten N 03/23/2015,2:48 PM  Shelly Flatten, [place], [facility] SLP [phone]

## 2015-03-23 NOTE — Progress Notes (Signed)
Speech Language Pathology Treatment:    Patient Details Name: Randall Wilkins MRN: [id number] DOB: [birth year] Today's Date: 03/23/2015 Time: 3893-7342 SLP Time Calculation (min) (ACUTE ONLY): 15 min  Assessment / Plan / Recommendation Clinical Impression  While the ST was charting results of the BSE the patient's full liquid tray arrived.  The pt's wife began feeding him using a straw to feed the patient liquids and soup.  An increase in coughing was noted.  Suggested that the use of a straw be discontinued and that the pt try some of the vanilla pudding on his tray and the coughing continued.  Given the increase in the amount of coughing observed rx keep the pt NPO and proceed with an MBS tomorrow to determine safest diet.     HPI HPI: Randall Wilkins is a 74 y.o. male with a PMH of stroke, MI, CAD, DM, Rhabdomyolysis, short term memory loss and hypertension who presents today with 2-3 days of increased confusion, generalized weakness and increased urinary frequency. Wife states for the past 2-3 days she has noticed he has been dragging his feet when he is walking more than usual and also that he normally uses a cane but has had to use a walker during this time. Pt has residual L leg weakness from the rhabdomyolysis 6 years ago but his R leg has had minimal weakness until this current episode. Early this morning pt fell 3 times while he was trying to get to the restroom and the wife had to assist him in standing each time. He denies hitting his head or LOC with each fall. Pt was found to have a large hematoma to his R lateral abdomen this afternoon that was not there yesterday and pt and wife believe it was due to the falls. Pt has had unexplained daily diarrhea for the past two weeks.Today pt's wife has noticed he has had two small episodes of bowel incontinence which is also new for the patient. Pt was diagnosed prior to arrival in the ED with a UTI by his PCP and was told to come to the ED to have IV abx  administered. Pt has also been complaining of worsening exertional dyspnea over the past few days. He denies any cp, abdominal pain, nausea, vomiting, constipation, changes in vision, dizziness. Of note, when patient had his stroke 2 years ago pt was on plavix at the time from his previous MI and stent placement. His wife states his short term memory loss began after the stroke. In the ED patient was found to have a fever, have leukocytosis, mild confusion and evidence of a UTI. TRH asked for admission for UTI. Current chest x-ray shows bew moderate airspace process over the central right lung likely infection and less likely asymmetric edema.  Of note, the pt had a regular barium swallow 02/27/2015 which showed the following:  barium tablet initially lodges in the right piriform sinus, reproducing the patient's symptoms. This finally passes into the esophagus, but lodges in the distal esophagus at an area of mild   Pertinent Vitals    SLP Plan  MBS (keep the pt NPO pending results of MBS.)    Recommendations Diet recommendations: NPO Medication Administration: Crushed with puree Supervision: Staff to assist with self feeding Compensations: Slow rate;Small sips/bites;Follow solids with liquid Postural Changes and/or Swallow Maneuvers: Seated upright 90 degrees;Upright 30-60 min after meal             Oral Care Recommendations: Oral care Q4 per protocol Follow  up Recommendations:  (to be determined) Plan: MBS (keep the pt NPO pending results of MBS.)    GO     Lamar Sprinkles 03/23/2015, 3:03 PM  Shelly Flatten, [place], [facility] SLP [phone]

## 2015-03-23 NOTE — Progress Notes (Signed)
Subjective/Objective Admitted 4/7  2-3 days of increased confusion, generalized weakness and increased urinary frequency.Urine cx with ecoli. Also having diarrhea- cdiff negative, GI pathogen panel pending. Patient found by RN this morning after telemetry showed bradycardia with cyanosis and having difficulty breathing. He had removed his CPAP. Oxygen saturation 85%. Placed on NRB 100% with improvement in oxygen saturation.  Physical exam: General : lethargic, but arousable. Confused Lungs: Scatter wheezes RUL, diminished throughout Heart RRR  Scheduled Meds: . amitriptyline  50 mg Oral QHS  . amLODipine  5 mg Oral Daily  . aspirin EC  325 mg Oral Daily  . cefTRIAXone (ROCEPHIN)  IV  1 g Intravenous Q24H  . clopidogrel  75 mg Oral Daily  . donepezil  10 mg Oral Daily  . insulin aspart  0-20 Units Subcutaneous TID WC  . insulin aspart  0-5 Units Subcutaneous QHS  . insulin aspart  15 Units Subcutaneous TID WC  . insulin glargine  50 Units Subcutaneous q morning - 10a  . metoprolol succinate  25 mg Oral Daily  . omega-3 acid ethyl esters  1 g Oral BID  . rosuvastatin  20 mg Oral Daily  . tamsulosin  0.4 mg Oral QHS   Continuous Infusions:  PRN Meds:acetaminophen **OR** acetaminophen, alum & mag hydroxide-simeth, feeding supplement (GLUCERNA SHAKE), ondansetron **OR** ondansetron (ZOFRAN) IV, oxyCODONE  Vital signs in last 24 hours: Temp:  [98.6 F (37 C)-101.2 F (38.4 C)] 101.2 F (38.4 C) (04/09 0559) Pulse Rate:  [70-96] 96 (04/09 0559) Resp:  [18-40] 40 (04/09 0559) BP: (120-180)/(45-103) 180/71 mmHg (04/09 0559) SpO2:  [85 %-94 %] 92 % (04/09 0559)  Intake/Output last 3 shifts: I/O last 3 completed shifts: In: 1480 [P.O.:240; I.V.:1190; IV Piggyback:50] Out: 750 [Urine:750] Intake/Output this shift:    Problem Assessment/Plan  Hypoxia/respiratory distress: ABG obtained on NRB 7.394     pCO2 arterial 35.0 - 45.0 mmHg 42.9   pO2, Arterial 80.0 - 100.0 mmHg 74.1  (L)   Bicarbonate 20.0 - 24.0 mEq/L 25.6 (H)   TCO2 0 - 100 mmol/L 26.9   Acid-Base Excess 0.0 - 2.0 mmol/L 1.2   O2 Saturation % 93.3   Patient temperature  98.6       Transfer to SDU. Evaluate repeat CXR

## 2015-03-23 NOTE — Progress Notes (Addendum)
Patient Demographics  Randall Wilkins, is a 74 y.o. male, DOB - 05/28/1941, BMW:[id number]  Admit date - 03/21/2015   Admitting Physician Costin Karlyne Greenspan, MD  Outpatient Primary MD for the patient is Simona Huh, MD  LOS - 2   Chief Complaint  Patient presents with  . Urinary Frequency  . Altered Mental Status        Subjective:   [name] today has, No headache, No chest pain, No abdominal pain - No Nausea, No new weakness tingling or numbness, No Cough - +ve SOB.    Assessment & Plan    1.Acute Hypoxic Resp Failure - due to NSTEMI + Aspiration HCAP - has been placed on BiPAP and stepdown,   A. Cardiology consulted, have initiated heparin drip per MI protocol, on aspirin, Plavix, statin and beta blocker, he is chest pain-free. Echo done yesterday reviewed. EF 55-60% does have baseline chronic hypokinesis.   B. Aspiration pneumonia. Likely last night after MI, blood and sputum cultures, IV antibiotics per CAP protocol. BiPAP along with nebulizer treatments and supportive care as needed.    2. Acute renal failure on chronic kidney disease stage III. Baseline creatinine close to 1.5. Sodium is less than 10 on admission, likely dehydration, hydrate gently and monitor.   3. DM type II. Since the status is questionable due to aspiration pneumonia and BiPAP. Have reduced Lantus and short-acting insulin for now.  Lab Results  Component Value Date   HGBA1C 7.7* 03/21/2015    CBG (last 3)   Recent Labs  03/22/15 2210 03/23/15 0915 03/23/15 1127  GLUCAP 113* 186* 228*     4. Toxic encephalopathy due to UTI upon admission. Has resolved. Is baseline. Continue antibiotics as in #1 above.   5.Dyslipidemia - on statin.   6.CAD - as in #1.   7. Dysphagia. Have switched him to  full liquid diet for now, he is likely aspirated, have called speech and will initiate GI evaluation as well. He was due to see Dr. Oletta Lamas in the outpatient setting.     Code Status: Full  Family Communication: Message left for the wife  Disposition Plan: TBD   Procedures   TTE  - Left ventricle: The cavity size was normal. Wall thickness wasincreased in a pattern of mild LVH. Systolic function was normal.The estimated ejection fraction was in the range of 55% to 60%.There is hypokinesis of the basalinferolateral myocardium.Doppler parameters are consistent with abnormal left ventricularrelaxation (grade 1 diastolic dysfunction). Doppler parametersare consistent with high ventricular filling pressure. - Mitral valve: Calcified annulus.  Impressions: Mild hypokinesis of the basal inferolateral wall with overallnormal LV function; grade 1 diastolic dysfunction with elevatedLV filling pressure; calcified aortic valve; mean gradient of 50mmHg suggests mild AS but visually valve opens well.   Consults  Cards   Medications  Scheduled Meds: . amitriptyline  50 mg Oral QHS  . amLODipine  5 mg Oral Daily  . aspirin EC  325 mg Oral Daily  . clopidogrel  75 mg Oral Daily  . donepezil  10 mg Oral Daily  . insulin aspart  0-20 Units Subcutaneous Q4H  . insulin glargine  20 Units Subcutaneous BID  . metoprolol succinate  25 mg Oral Daily  . omega-3 acid  ethyl esters  1 g Oral BID  . piperacillin-tazobactam (ZOSYN)  IV  3.375 g Intravenous 3 times per day  . rosuvastatin  20 mg Oral Daily  . tamsulosin  0.4 mg Oral QHS  . vancomycin  1,250 mg Intravenous Q24H   Continuous Infusions: . sodium chloride 75 mL/hr at 03/23/15 0900  . heparin 1,000 Units/hr (03/23/15 1311)   PRN Meds:.acetaminophen **OR** acetaminophen, albuterol, alum & mag hydroxide-simeth, feeding supplement (GLUCERNA SHAKE), ondansetron **OR** ondansetron (ZOFRAN) IV, oxyCODONE  DVT Prophylaxis  Heparin -  SCDs   Lab Results  Component Value Date   PLT 114* 03/23/2015    Antibiotics     Anti-infectives    Start     Dose/Rate Route Frequency Ordered Stop   03/23/15 0845  vancomycin (VANCOCIN) 1,250 mg in sodium chloride 0.9 % 250 mL IVPB     1,250 mg 166.7 mL/hr over 90 Minutes Intravenous Every 24 hours 03/23/15 0830     03/23/15 0845  piperacillin-tazobactam (ZOSYN) IVPB 3.375 g     3.375 g 12.5 mL/hr over 240 Minutes Intravenous 3 times per day 03/23/15 0830     03/22/15 1500  cefTRIAXone (ROCEPHIN) 1 g in dextrose 5 % 50 mL IVPB - Premix  Status:  Discontinued     1 g 100 mL/hr over 30 Minutes Intravenous Every 24 hours 03/21/15 1710 03/23/15 0752   03/21/15 1500  cefTRIAXone (ROCEPHIN) 1 g in dextrose 5 % 50 mL IVPB     1 g 100 mL/hr over 30 Minutes Intravenous  Once 03/21/15 1452 03/21/15 1540          Objective:   Filed Vitals:   03/23/15 0748 03/23/15 0945 03/23/15 1045 03/23/15 1133  BP: 115/48 141/61 126/74   Pulse: 88 80 77 76  Temp: 100.8 F (38.2 C)   99.2 F (37.3 C)  TempSrc: Axillary   Oral  Resp: 35 25 22 32  Height:      Weight:      SpO2: 98% 99% 100% 98%    Wt Readings from Last 3 Encounters:  03/21/15 117.164 kg (258 lb 4.8 oz)  12/11/14 117.935 kg (260 lb)  11/14/14 116.62 kg (257 lb 1.6 oz)     Intake/Output Summary (Last 24 hours) at 03/23/15 1326 Last data filed at 03/23/15 0908  Gross per 24 hour  Intake 753.75 ml  Output    250 ml  Net 503.75 ml     Physical Exam  Awake Alert, Oriented X 3, No new F.N deficits, Normal affect,  [place].AT,PERRAL Supple Neck,No JVD, No cervical lymphadenopathy appriciated.  Symmetrical Chest wall movement, Good air movement bilaterally, RML rales RRR,No Gallops,Rubs or new Murmurs, No Parasternal Heave +ve B.Sounds, Abd Soft, No tenderness, No organomegaly appriciated, No rebound - guarding or rigidity. No Cyanosis, Clubbing or edema, No new Rash or bruise     Data Review   Micro  Results Recent Results (from the past 240 hour(s))  Urine culture     Status: None (Preliminary result)   Collection Time: 03/21/15  3:02 PM  Result Value Ref Range Status   Specimen Description URINE, CLEAN CATCH  Final   Special Requests NONE  Final   Colony Count   Final    >=100,000 COLONIES/ML Performed at [facility]    Culture   Final    ESCHERICHIA COLI Performed at [facility]    Report Status PENDING  Incomplete  Urine culture     Status: None   Collection  Time: 03/21/15  6:00 PM  Result Value Ref Range Status   Specimen Description URINE, RANDOM  Final   Special Requests NONE  Final   Colony Count NO GROWTH Performed at [facility]   Final   Culture NO GROWTH Performed at [facility]   Final   Report Status 03/23/2015 FINAL  Final  Clostridium Difficile by PCR     Status: None   Collection Time: 03/21/15  9:33 PM  Result Value Ref Range Status   C difficile by pcr NEGATIVE NEGATIVE Final  MRSA PCR Screening     Status: None   Collection Time: 03/23/15  8:06 AM  Result Value Ref Range Status   MRSA by PCR NEGATIVE NEGATIVE Final    Comment:        The GeneXpert MRSA Assay (FDA approved for NASAL specimens only), is one component of a comprehensive MRSA colonization surveillance program. It is not intended to diagnose MRSA infection nor to guide or monitor treatment for MRSA infections.     Radiology Reports X-ray Chest Pa And Lateral  03/22/2015   CLINICAL DATA:  Shortness of breath  EXAM: CHEST  2 VIEW  COMPARISON:  11/14/2009  FINDINGS: No cardiomegaly for technique. Aortic and hilar contours are negative. 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 No edema, consolidation, effusion, or pneumothorax.  IMPRESSION: No active cardiopulmonary disease.   Electronically Signed   By: Monte Fantasia M.D.   On: 03/22/2015 01:10   Dg Esophagus  02/27/2015   CLINICAL DATA:  Dysphagia  EXAM: ESOPHOGRAM /  BARIUM SWALLOW / BARIUM TABLET STUDY  TECHNIQUE: Combined double contrast and single contrast examination performed using effervescent crystals, thick barium liquid, and thin barium liquid. The patient was observed with fluoroscopy swallowing a 65mm barium sulphate tablet.  FLUOROSCOPY TIME:  4 minutes 0 seconds  COMPARISON:  None  FINDINGS: No cervical esophageal narrowing. Cervical plate noted from anterior fusion at C3-4. Separate plate at Z6-1.  Fluoroscopic evaluation of swallowing demonstrates disruption of 3 out of 4 primary esophageal peristaltic waves with moderate tertiary contractions. No fold thickening.  There is mild distal esophageal short segment stricture, best seen on the upright views and after administration of the barium tablet. The barium tablet sticks for several minutes in the distal esophagus. The tablet also initially sticks to the right of midline in the cervical esophagus, reproduce in the patient's symptoms. This is presumably within the right piriform sinus. The barium tablet finally passes into the esophagus but lodges distally in area of mild distal esophageal stricture.  IMPRESSION: The barium tablet initially lodges in the right piriform sinus, reproducing the patient's symptoms. This finally passes into the esophagus, but lodges in the distal esophagus at an area of mild short segment distal esophageal stricture.  Esophageal dysmotility with disruption of primary peristaltic waves an moderate tertiary contractions.   Electronically Signed   By: Rolm Baptise M.D.   On: 02/27/2015 12:38   Dg Chest Port 1 View  03/23/2015   CLINICAL DATA:  Shortness of breath.  EXAM: PORTABLE CHEST - 1 VIEW  COMPARISON:  03/21/2015  FINDINGS: Lungs are hypoinflated and demonstrate a new moderate airspace process over the central right lung. No evidence of effusion or pneumothorax. Cardiomediastinal silhouette and remainder of the exam is unchanged.  IMPRESSION: New moderate airspace process over the  central right lung likely infection and less likely asymmetric edema.   Electronically Signed   By: Marin Olp M.D.  On: 03/23/2015 08:12     CBC  Recent Labs Lab 03/21/15 1326 03/22/15 0420 03/23/15 1018  WBC 18.4* 16.7* 12.0*  HGB 13.3 11.6* 12.8*  HCT 39.7 35.1* 38.5*  PLT 107* 109* 114*  MCV 90.6 90.0 90.4  MCH 30.4 29.7 30.0  MCHC 33.5 33.0 33.2  RDW 14.9 15.1 15.1    Chemistries   Recent Labs Lab 03/21/15 1326 03/22/15 0420 03/23/15 1018  NA 136 136 135  K 3.9 4.0 3.9  CL 99 102 98  CO2 24 26 22   GLUCOSE 299* 187* 205*  BUN 27* 38* 49*  CREATININE 2.06* 2.38* 2.44*  CALCIUM 8.6 8.3* 8.3*  AST 22  --  67*  ALT 14  --  22  ALKPHOS 69  --  64  BILITOT 1.5*  --  1.1   ------------------------------------------------------------------------------------------------------------------ estimated creatinine clearance is 33.5 mL/min (by C-G formula based on Cr of 2.44). ------------------------------------------------------------------------------------------------------------------  Recent Labs  03/21/15 1815  HGBA1C 7.7*   ------------------------------------------------------------------------------------------------------------------ No results for input(s): CHOL, HDL, LDLCALC, TRIG, CHOLHDL, LDLDIRECT in the last 72 hours. ------------------------------------------------------------------------------------------------------------------ No results for input(s): TSH, T4TOTAL, T3FREE, THYROIDAB in the last 72 hours.  Invalid input(s): FREET3 ------------------------------------------------------------------------------------------------------------------ No results for input(s): VITAMINB12, FOLATE, FERRITIN, TIBC, IRON, RETICCTPCT in the last 72 hours.  Coagulation profile  Recent Labs Lab 03/21/15 1326  INR 1.12    No results for input(s): DDIMER in the last 72 hours.  Cardiac Enzymes  Recent Labs Lab 03/23/15 1010  TROPONINI 9.21*    ------------------------------------------------------------------------------------------------------------------ Invalid input(s): POCBNP     Time Spent in minutes   35   Travers Goodley K M.D on 03/23/2015 at 1:26 PM  Between 7am to 7pm - Pager - [phone]  After 7pm go to [email or web address] - password [facility]  [facility]   Office  [phone]

## 2015-03-23 NOTE — H&P (Signed)
Found pt cyanotic and having difficulty breathing, diaphoretic. His CPAP off. This was the second time I found him off CPAP. Placed non- rebreather. Vs 101.2-96-40-180/71. 92% via non-rebreather. Jonette Eva NP notified.

## 2015-03-23 NOTE — Progress Notes (Signed)
ANTICOAGULATION CONSULT NOTE - Initial Consult  Pharmacy Consult for heparin Indication: chest pain/ACS  Allergies  Allergen Reactions  . Actos [Pioglitazone] Swelling  . Metformin And Related Nausea Only  . Niaspan [Niacin Er] Itching and Rash    Patient Measurements: Height: 5\' 9"  (175.3 cm) Weight: 258 lb 4.8 oz (117.164 kg) IBW/kg (Calculated) : 70.7 Heparin Dosing Weight: 97kg   Vital Signs: Temp: 100.8 F (38.2 C) (04/09 0748) Temp Source: Axillary (04/09 0748) BP: 126/74 mmHg (04/09 1045) Pulse Rate: 76 (04/09 1133)  Labs:  Recent Labs  03/21/15 1326 03/22/15 0420 03/23/15 1010 03/23/15 1018  HGB 13.3 11.6*  --  12.8*  HCT 39.7 35.1*  --  38.5*  PLT 107* 109*  --  114*  LABPROT 14.5  --   --   --   INR 1.12  --   --   --   CREATININE 2.06* 2.38*  --  2.44*  TROPONINI  --   --  9.21*  --     Estimated Creatinine Clearance: 33.5 mL/min (by C-G formula based on Cr of 2.44).   Medical History: Past Medical History  Diagnosis Date  . Rhabdomyolysis   . Memory loss   . Exogenous obesity   . Insulin dependent diabetes mellitus   . Peripheral neuropathy   . Venous insufficiency   . CAD (coronary artery disease)   . Hyperlipidemia   . Gout   . Stroke     L patietal with small scattered lacunar infarcts  . Left foot drop   . Hypertension   . History of nuclear stress test 07/2011    dipyridamole; fixed inferolateral defect, worse at stress than rest; no reversible ischemia; low risk scan   . OSA on CPAP     uses a cpap  . Heart attack     Medications:  Scheduled:  . amitriptyline  50 mg Oral QHS  . amLODipine  5 mg Oral Daily  . aspirin EC  325 mg Oral Daily  . clopidogrel  75 mg Oral Daily  . donepezil  10 mg Oral Daily  . insulin aspart  0-20 Units Subcutaneous Q4H  . insulin glargine  20 Units Subcutaneous BID  . metoprolol succinate  25 mg Oral Daily  . omega-3 acid ethyl esters  1 g Oral BID  . piperacillin-tazobactam (ZOSYN)  IV  3.375  g Intravenous 3 times per day  . rosuvastatin  20 mg Oral Daily  . tamsulosin  0.4 mg Oral QHS  . vancomycin  1,250 mg Intravenous Q24H    Assessment: 74yoM presented to the ED on 4/7 w/ generalized weakness and confusion. Troponin found to be elevated at 9.21. Pharmacy consulted to start heparin for ACS. Hgb 12.8, plts 114.     Goal of Therapy:  Heparin level 0.3-0.7 units/ml Monitor platelets by anticoagulation protocol: Yes   Plan:  Heparin bolus 4000 units Heparin gtt 1000 units/hr 8hr HL @ 2030 Daily HL/CBC Monitor s/sx of bleeding  Thank you for allowing pharmacy to be part of this patient's care team  [place], Pharm.D Clinical Pharmacy Resident Pager: [phone] 03/23/2015 .12:21 PM

## 2015-03-24 ENCOUNTER — Inpatient Hospital Stay (HOSPITAL_COMMUNITY): Payer: Medicare Other

## 2015-03-24 DIAGNOSIS — N170 Acute kidney failure with tubular necrosis: Secondary | ICD-10-CM

## 2015-03-24 DIAGNOSIS — J9601 Acute respiratory failure with hypoxia: Secondary | ICD-10-CM

## 2015-03-24 DIAGNOSIS — I214 Non-ST elevation (NSTEMI) myocardial infarction: Secondary | ICD-10-CM

## 2015-03-24 DIAGNOSIS — J810 Acute pulmonary edema: Secondary | ICD-10-CM

## 2015-03-24 DIAGNOSIS — I222 Subsequent non-ST elevation (NSTEMI) myocardial infarction: Secondary | ICD-10-CM

## 2015-03-24 LAB — GLUCOSE, CAPILLARY
Glucose-Capillary: 124 mg/dL — ABNORMAL HIGH (ref 70–99)
Glucose-Capillary: 164 mg/dL — ABNORMAL HIGH (ref 70–99)
Glucose-Capillary: 190 mg/dL — ABNORMAL HIGH (ref 70–99)
Glucose-Capillary: 194 mg/dL — ABNORMAL HIGH (ref 70–99)
Glucose-Capillary: 196 mg/dL — ABNORMAL HIGH (ref 70–99)
Glucose-Capillary: 198 mg/dL — ABNORMAL HIGH (ref 70–99)
Glucose-Capillary: 225 mg/dL — ABNORMAL HIGH (ref 70–99)

## 2015-03-24 LAB — CBC
HCT: 37.6 % — ABNORMAL LOW (ref 39.0–52.0)
Hemoglobin: 12.4 g/dL — ABNORMAL LOW (ref 13.0–17.0)
MCH: 29.4 pg (ref 26.0–34.0)
MCHC: 33 g/dL (ref 30.0–36.0)
MCV: 89.1 fL (ref 78.0–100.0)
Platelets: 123 10*3/uL — ABNORMAL LOW (ref 150–400)
RBC: 4.22 MIL/uL (ref 4.22–5.81)
RDW: 15.2 % (ref 11.5–15.5)
WBC: 10 10*3/uL (ref 4.0–10.5)

## 2015-03-24 LAB — BASIC METABOLIC PANEL
Anion gap: 15 (ref 5–15)
BUN: 52 mg/dL — ABNORMAL HIGH (ref 6–23)
CO2: 21 mmol/L (ref 19–32)
Calcium: 8 mg/dL — ABNORMAL LOW (ref 8.4–10.5)
Chloride: 103 mmol/L (ref 96–112)
Creatinine, Ser: 2.08 mg/dL — ABNORMAL HIGH (ref 0.50–1.35)
GFR calc Af Amer: 34 mL/min — ABNORMAL LOW (ref >90)
GFR calc non Af Amer: 30 mL/min — ABNORMAL LOW (ref >90)
Glucose, Bld: 202 mg/dL — ABNORMAL HIGH (ref 70–99)
Potassium: 4 mmol/L (ref 3.5–5.1)
Sodium: 139 mmol/L (ref 135–145)

## 2015-03-24 LAB — HEPARIN LEVEL (UNFRACTIONATED)
Heparin Unfractionated: 0.28 IU/mL — ABNORMAL LOW (ref 0.30–0.70)
Heparin Unfractionated: 0.3 IU/mL (ref 0.30–0.70)

## 2015-03-24 LAB — TROPONIN I: Troponin I: 26.86 ng/mL (ref <0.031)

## 2015-03-24 MED ORDER — HEPARIN BOLUS VIA INFUSION
1500.0000 [IU] | Freq: Once | INTRAVENOUS | Status: AC
  Administered 2015-03-24: 1500 [IU] via INTRAVENOUS
  Filled 2015-03-24: qty 1500

## 2015-03-24 MED ORDER — SODIUM CHLORIDE 0.9 % IV SOLN
INTRAVENOUS | Status: DC
  Administered 2015-03-24: 1000 mL via INTRAVENOUS
  Administered 2015-03-24: 75 mL/h via INTRAVENOUS

## 2015-03-24 MED ORDER — HEPARIN (PORCINE) IN NACL 100-0.45 UNIT/ML-% IJ SOLN
1650.0000 [IU]/h | INTRAMUSCULAR | Status: DC
  Administered 2015-03-24: 1650 [IU]/h via INTRAVENOUS
  Filled 2015-03-24 (×5): qty 250

## 2015-03-24 NOTE — Progress Notes (Signed)
Patient Demographics  Randall Wilkins, is a 74 y.o. male, DOB - 05-Aug-1941, IFO:[id number]  Admit date - 03/21/2015   Admitting Physician Costin Karlyne Greenspan, MD  Outpatient Primary MD for the patient is Randall Huh, MD  LOS - 3   Chief Complaint  Patient presents with  . Urinary Frequency  . Altered Mental Status        Subjective:   Randall Wilkins today has, No headache, No chest pain, No abdominal pain - No Nausea, No new weakness tingling or numbness, No Cough, much improved shortness of breath.   Assessment & Plan    1.Acute Hypoxic Resp Failure - due to NSTEMI + Aspiration HCAP - has been placed on BiPAP and stepdown,    A. Cardiology consulted, continue on heparin drip per MI protocol, on aspirin, Plavix, statin and beta blocker, he is chest pain-free. Echo done earlier this admission reviewed. EF 55-60% does have baseline chronic hypokinesis. Troponin trend now stabilizing. Certainly had NSTEMI. Likely MI brought in by severe hypoxia from pneumonia in the presence of underlying CAD.   B. Aspiration pneumonia. History of dysphagia, he remembers choking on food night of 03/22/2015, right middle lobe opacity, fever of 101 on 03/23/2015 with hypoxic respiratory failure. We will follow blood and sputum cultures, continue IV antibiotics per HCAP protocol. Hour of BiPAP, and try nasal cannula oxygen along with nebulizer treatments and supportive care as needed.    2. Acute renal failure on chronic kidney disease stage III. Baseline creatinine close to 1.5. Ur Sodium was less than 10 on admission, likely dehydration, proving with IV fluids continue.     3. DM type II. Since the status is questionable due to aspiration pneumonia and BiPAP. Have reduced Lantus and short-acting insulin for  now.  Lab Results  Component Value Date   HGBA1C 7.7* 03/21/2015    CBG (last 3)   Recent Labs  03/23/15 2357 03/24/15 0404 03/24/15 0744  GLUCAP 198* 196* 225*     4. Toxic encephalopathy due to UTI upon admission. Has resolved. Is baseline. Continue antibiotics as in #1 above.   5.Dyslipidemia - on statin.   6.CAD - as in #1.   7. Dysphagia. GI following. Has long-standing history of dysphagia after an episode of esophageal rupture in the past. Currently nothing by mouth, speech following, due for modified barium swallow on 03/24/2015.     Code Status: Full  Family Communication: Wife, son  - clearly explained poor prognosis and critical state.  Disposition Plan: TBD   Procedures   TTE  - Left ventricle: The cavity size was normal. Wall thickness wasincreased in a pattern of mild LVH. Systolic function was normal.The estimated ejection fraction was in the range of 55% to 60%.There is hypokinesis of the basalinferolateral myocardium.Doppler parameters are consistent with abnormal left ventricularrelaxation (grade 1 diastolic dysfunction). Doppler parametersare consistent with high ventricular filling pressure. - Mitral valve: Calcified annulus.  Impressions: Mild hypokinesis of the basal inferolateral wall with overallnormal LV function; grade 1 diastolic dysfunction with elevatedLV filling pressure; calcified aortic valve; mean gradient of 60mmHg suggests mild AS but visually valve opens well.   Consults  Cards, GI, PCCM   Medications  Scheduled Meds: . amitriptyline  50 mg Oral QHS  .  amLODipine  5 mg Oral Daily  . aspirin EC  325 mg Oral Daily  . clopidogrel  75 mg Oral Daily  . donepezil  10 mg Oral Daily  . insulin aspart  0-20 Units Subcutaneous Q4H  . insulin glargine  20 Units Subcutaneous BID  . metoprolol succinate  25 mg Oral Daily  . omega-3 acid ethyl esters  1 g Oral BID  . piperacillin-tazobactam (ZOSYN)  IV  3.375 g Intravenous  3 times per day  . rosuvastatin  20 mg Oral Daily  . tamsulosin  0.4 mg Oral QHS  . vancomycin  1,250 mg Intravenous Q24H   Continuous Infusions: . heparin 1,500 Units/hr (03/24/15 0621)   PRN Meds:.acetaminophen **OR** acetaminophen, albuterol, alum & mag hydroxide-simeth, feeding supplement (GLUCERNA SHAKE), ondansetron **OR** ondansetron (ZOFRAN) IV, oxyCODONE  DVT Prophylaxis  Heparin - SCDs   Lab Results  Component Value Date   PLT 123* 03/24/2015    Antibiotics     Anti-infectives    Start     Dose/Rate Route Frequency Ordered Stop   03/23/15 0845  vancomycin (VANCOCIN) 1,250 mg in sodium chloride 0.9 % 250 mL IVPB     1,250 mg 166.7 mL/hr over 90 Minutes Intravenous Every 24 hours 03/23/15 0830     03/23/15 0845  piperacillin-tazobactam (ZOSYN) IVPB 3.375 g     3.375 g 12.5 mL/hr over 240 Minutes Intravenous 3 times per day 03/23/15 0830     03/22/15 1500  cefTRIAXone (ROCEPHIN) 1 g in dextrose 5 % 50 mL IVPB - Premix  Status:  Discontinued     1 g 100 mL/hr over 30 Minutes Intravenous Every 24 hours 03/21/15 1710 03/23/15 0752   03/21/15 1500  cefTRIAXone (ROCEPHIN) 1 g in dextrose 5 % 50 mL IVPB     1 g 100 mL/hr over 30 Minutes Intravenous  Once 03/21/15 1452 03/21/15 1540          Objective:   Filed Vitals:   03/23/15 2359 03/24/15 0405 03/24/15 0500 03/24/15 0700  BP: 130/63 111/52    Pulse: 71 63    Temp: 100.4 F (38 C) 99.1 F (37.3 C)  97.7 F (36.5 C)  TempSrc: Axillary Axillary  Oral  Resp: 40 32    Height:      Weight:   116.4 kg (256 lb 9.9 oz)   SpO2: 100% 100%      Wt Readings from Last 3 Encounters:  03/24/15 116.4 kg (256 lb 9.9 oz)  12/11/14 117.935 kg (260 lb)  11/14/14 116.62 kg (257 lb 1.6 oz)     Intake/Output Summary (Last 24 hours) at 03/24/15 0922 Last data filed at 03/24/15 0630  Gross per 24 hour  Intake 1547.17 ml  Output   1075 ml  Net 472.17 ml     Physical Exam  Awake Alert, Oriented X 3, No new F.N  deficits, Normal affect,  [place].AT,PERRAL Supple Neck,No JVD, No cervical lymphadenopathy appriciated.  Symmetrical Chest wall movement, Good air movement bilaterally, RML rales RRR,No Gallops,Rubs or new Murmurs, No Parasternal Heave +ve B.Sounds, Abd Soft, No tenderness, No organomegaly appriciated, No rebound - guarding or rigidity. No Cyanosis, Clubbing or edema, No new Rash or bruise     Data Review   Micro Results Recent Results (from the past 240 hour(s))  Urine culture     Status: None   Collection Time: 03/21/15  3:02 PM  Result Value Ref Range Status   Specimen Description URINE, CLEAN CATCH  Final   Special  Requests NONE  Final   Colony Count   Final    >=100,000 COLONIES/ML Performed at [facility]    Culture   Final    ESCHERICHIA COLI Performed at [facility]    Report Status 03/23/2015 FINAL  Final   Organism ID, Bacteria ESCHERICHIA COLI  Final      Susceptibility   Escherichia coli - MIC*    AMPICILLIN 8 SENSITIVE Sensitive     CEFAZOLIN <=4 SENSITIVE Sensitive     CEFTRIAXONE <=1 SENSITIVE Sensitive     CIPROFLOXACIN >=4 RESISTANT Resistant     GENTAMICIN <=1 SENSITIVE Sensitive     LEVOFLOXACIN >=8 RESISTANT Resistant     NITROFURANTOIN 64 INTERMEDIATE Intermediate     TOBRAMYCIN <=1 SENSITIVE Sensitive     TRIMETH/SULFA <=20 SENSITIVE Sensitive     PIP/TAZO <=4 SENSITIVE Sensitive     * ESCHERICHIA COLI  Urine culture     Status: None   Collection Time: 03/21/15  6:00 PM  Result Value Ref Range Status   Specimen Description URINE, RANDOM  Final   Special Requests NONE  Final   Colony Count NO GROWTH Performed at [facility]   Final   Culture NO GROWTH Performed at [facility]   Final   Report Status 03/23/2015 FINAL  Final  Clostridium Difficile by PCR     Status: None   Collection Time: 03/21/15  9:33 PM  Result Value Ref Range Status   C difficile by pcr NEGATIVE NEGATIVE Final  MRSA PCR Screening      Status: None   Collection Time: 03/23/15  8:06 AM  Result Value Ref Range Status   MRSA by PCR NEGATIVE NEGATIVE Final    Comment:        The GeneXpert MRSA Assay (FDA approved for NASAL specimens only), is one component of a comprehensive MRSA colonization surveillance program. It is not intended to diagnose MRSA infection nor to guide or monitor treatment for MRSA infections.   Culture, blood (routine x 2)     Status: None (Preliminary result)   Collection Time: 03/23/15  8:25 AM  Result Value Ref Range Status   Specimen Description BLOOD RIGHT ARM  Final   Special Requests BOTTLES DRAWN AEROBIC AND ANAEROBIC 10CC  Final   Culture   Final           BLOOD CULTURE RECEIVED NO GROWTH TO DATE CULTURE WILL BE HELD FOR 5 DAYS BEFORE ISSUING A FINAL NEGATIVE REPORT Performed at [facility]    Report Status PENDING  Incomplete  Culture, blood (routine x 2)     Status: None (Preliminary result)   Collection Time: 03/23/15  8:32 AM  Result Value Ref Range Status   Specimen Description BLOOD RIGHT ARM  Final   Special Requests BOTTLES DRAWN AEROBIC AND ANAEROBIC [place]  Final   Culture   Final           BLOOD CULTURE RECEIVED NO GROWTH TO DATE CULTURE WILL BE HELD FOR 5 DAYS BEFORE ISSUING A FINAL NEGATIVE REPORT Performed at [facility]    Report Status PENDING  Incomplete  Culture, blood (routine x 2)     Status: None (Preliminary result)   Collection Time: 03/23/15 10:00 AM  Result Value Ref Range Status   Specimen Description BLOOD LEFT HAND  Final   Special Requests BOTTLES DRAWN AEROBIC ONLY [place]  Final   Culture   Final  BLOOD CULTURE RECEIVED NO GROWTH TO DATE CULTURE WILL BE HELD FOR 5 DAYS BEFORE ISSUING A FINAL NEGATIVE REPORT Performed at [facility]    Report Status PENDING  Incomplete  Culture, blood (routine x 2)     Status: None (Preliminary result)   Collection Time: 03/23/15 10:15 AM  Result Value Ref Range Status   Specimen  Description BLOOD RIGHT HAND  Final   Special Requests BOTTLES DRAWN AEROBIC ONLY 2CC  Final   Culture   Final           BLOOD CULTURE RECEIVED NO GROWTH TO DATE CULTURE WILL BE HELD FOR 5 DAYS BEFORE ISSUING A FINAL NEGATIVE REPORT Performed at [facility]    Report Status PENDING  Incomplete    Radiology Reports X-ray Chest Pa And Lateral  03/22/2015   CLINICAL DATA:  Shortness of breath  EXAM: CHEST  2 VIEW  COMPARISON:  11/14/2009  FINDINGS: No cardiomegaly for technique. Aortic and hilar contours are negative. 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 No edema, consolidation, effusion, or pneumothorax.  IMPRESSION: No active cardiopulmonary disease.   Electronically Signed   By: Monte Fantasia M.D.   On: 03/22/2015 01:10   Dg Esophagus  02/27/2015   CLINICAL DATA:  Dysphagia  EXAM: ESOPHOGRAM / BARIUM SWALLOW / BARIUM TABLET STUDY  TECHNIQUE: Combined double contrast and single contrast examination performed using effervescent crystals, thick barium liquid, and thin barium liquid. The patient was observed with fluoroscopy swallowing a 76mm barium sulphate tablet.  FLUOROSCOPY TIME:  4 minutes 0 seconds  COMPARISON:  None  FINDINGS: No cervical esophageal narrowing. Cervical plate noted from anterior fusion at C3-4. Separate plate at W5-4.  Fluoroscopic evaluation of swallowing demonstrates disruption of 3 out of 4 primary esophageal peristaltic waves with moderate tertiary contractions. No fold thickening.  There is mild distal esophageal short segment stricture, best seen on the upright views and after administration of the barium tablet. The barium tablet sticks for several minutes in the distal esophagus. The tablet also initially sticks to the right of midline in the cervical esophagus, reproduce in the patient's symptoms. This is presumably within the right piriform sinus. The barium tablet finally passes into the esophagus but lodges distally in area of  mild distal esophageal stricture.  IMPRESSION: The barium tablet initially lodges in the right piriform sinus, reproducing the patient's symptoms. This finally passes into the esophagus, but lodges in the distal esophagus at an area of mild short segment distal esophageal stricture.  Esophageal dysmotility with disruption of primary peristaltic waves an moderate tertiary contractions.   Electronically Signed   By: Rolm Baptise M.D.   On: 02/27/2015 12:38   Dg Chest Port 1 View  03/23/2015   CLINICAL DATA:  Shortness of breath.  EXAM: PORTABLE CHEST - 1 VIEW  COMPARISON:  03/21/2015  FINDINGS: Lungs are hypoinflated and demonstrate a new moderate airspace process over the central right lung. No evidence of effusion or pneumothorax. Cardiomediastinal silhouette and remainder of the exam is unchanged.  IMPRESSION: New moderate airspace process over the central right lung likely infection and less likely asymmetric edema.   Electronically Signed   By: Marin Olp M.D.   On: 03/23/2015 08:12     CBC  Recent Labs Lab 03/21/15 1326 03/22/15 0420 03/23/15 1018 03/24/15 0445  WBC 18.4* 16.7* 12.0* 10.0  HGB 13.3 11.6* 12.8* 12.4*  HCT 39.7 35.1* 38.5* 37.6*  PLT 107* 109* 114* 123*  MCV  90.6 90.0 90.4 89.1  MCH 30.4 29.7 30.0 29.4  MCHC 33.5 33.0 33.2 33.0  RDW 14.9 15.1 15.1 15.2    Chemistries   Recent Labs Lab 03/21/15 1326 03/22/15 0420 03/23/15 1018 03/24/15 0445  NA 136 136 135 139  K 3.9 4.0 3.9 4.0  CL 99 102 98 103  CO2 24 26 22 21   GLUCOSE 299* 187* 205* 202*  BUN 27* 38* 49* 52*  CREATININE 2.06* 2.38* 2.44* 2.08*  CALCIUM 8.6 8.3* 8.3* 8.0*  AST 22  --  67*  --   ALT 14  --  22  --   ALKPHOS 69  --  64  --   BILITOT 1.5*  --  1.1  --    ------------------------------------------------------------------------------------------------------------------ estimated creatinine clearance is 39.2 mL/min (by C-G formula based on Cr of  2.08). ------------------------------------------------------------------------------------------------------------------  Recent Labs  03/21/15 1815  HGBA1C 7.7*   ------------------------------------------------------------------------------------------------------------------ No results for input(s): CHOL, HDL, LDLCALC, TRIG, CHOLHDL, LDLDIRECT in the last 72 hours. ------------------------------------------------------------------------------------------------------------------ No results for input(s): TSH, T4TOTAL, T3FREE, THYROIDAB in the last 72 hours.  Invalid input(s): FREET3 ------------------------------------------------------------------------------------------------------------------ No results for input(s): VITAMINB12, FOLATE, FERRITIN, TIBC, IRON, RETICCTPCT in the last 72 hours.  Coagulation profile  Recent Labs Lab 03/21/15 1326  INR 1.12    No results for input(s): DDIMER in the last 72 hours.  Cardiac Enzymes  Recent Labs Lab 03/23/15 1010 03/23/15 1628 03/23/15 2240  TROPONINI 9.21* 23.54* 26.86*   ------------------------------------------------------------------------------------------------------------------ Invalid input(s): POCBNP   Time Spent in minutes   35   Jamice Carreno K M.D on 03/24/2015 at 9:22 AM  Between 7am to 7pm - Pager - [phone]  After 7pm go to [email or web address] - password [facility]  [facility]   Office  [phone]

## 2015-03-24 NOTE — Progress Notes (Signed)
ANTICOAGULATION CONSULT NOTE - Follow up [place] for heparin Indication: chest pain/ACS  Allergies  Allergen Reactions  . Actos [Pioglitazone] Swelling  . Metformin And Related Nausea Only  . Niaspan [Niacin Er] Itching and Rash    Patient Measurements: Height: 5\' 9"  (175.3 cm) Weight: 256 lb 9.9 oz (116.4 kg) IBW/kg (Calculated) : 70.7 Heparin Dosing Weight: 97kg   Vital Signs: Temp: 97.9 F (36.6 C) (04/10 1150) Temp Source: Oral (04/10 1150) BP: 125/57 mmHg (04/10 0740) Pulse Rate: 60 (04/10 1030)  Labs:  Recent Labs  03/22/15 0420 03/23/15 1010 03/23/15 1018 03/23/15 1628 03/23/15 2040 03/23/15 2240 03/24/15 0445 03/24/15 1530  HGB 11.6*  --  12.8*  --   --   --  12.4*  --   HCT 35.1*  --  38.5*  --   --   --  37.6*  --   PLT 109*  --  114*  --   --   --  123*  --   HEPARINUNFRC  --   --   --   --  0.11*  --  0.28* 0.30  CREATININE 2.38*  --  2.44*  --   --   --  2.08*  --   TROPONINI  --  9.21*  --  23.54*  --  26.86*  --   --     Estimated Creatinine Clearance: 39.2 mL/min (by C-G formula based on Cr of 2.08).   Medications:  Infusions:  . sodium chloride 75 mL/hr (03/24/15 1004)  . heparin 1,500 Units/hr (03/24/15 1002)    Assessment: 74yoM presented to the ED on 4/7 w/ generalized weakness and confusion. Troponin found to be elevated at 9.21 and pharmacy consulted to start heparin for ACS.  Troponin >> 26.86  Heparin level is 0.3 on 1500 units/hr which is low end of goal Hgb 12.4, plts 123K     No bleeding noted per RN's report.  Goal of Therapy:  Heparin level 0.3-0.7 units/ml Monitor platelets by anticoagulation protocol: Yes   Plan:  Increase heparin drip to 1650 units/hr Daily heparin level and CBC Monitor s/sx of bleeding  [facility], [place].D., BCPS Clinical Pharmacist Pager: [phone] 03/24/2015 4:48 PM

## 2015-03-24 NOTE — Progress Notes (Signed)
SLP Cancellation Note  Patient Details Name: Randall Wilkins MRN: [id number] DOB: [birth year]   Cancelled treatment:       MBS not Completed: Medical issues which prohibited therapy (pt on Bipap when attempted MBS)   Jonel Sick,PAT, M.S., CCC-SLP 03/24/2015, 10:54 AM

## 2015-03-24 NOTE — Progress Notes (Signed)
ANTICOAGULATION CONSULT NOTE -Follow up [place] [facility] for heparin Indication: chest pain/ACS  Allergies  Allergen Reactions  . Actos [Pioglitazone] Swelling  . Metformin And Related Nausea Only  . Niaspan [Niacin Er] Itching and Rash    Patient Measurements: Height: 5\' 9"  (175.3 cm) Weight: 258 lb 4.8 oz (117.164 kg) IBW/kg (Calculated) : 70.7 Heparin Dosing Weight: 97kg   Vital Signs: Temp: 99.1 F (37.3 C) (04/10 0405) Temp Source: Axillary (04/10 0405) BP: 111/52 mmHg (04/10 0405) Pulse Rate: 63 (04/10 0405)  Labs:  Recent Labs  03/21/15 1326 03/22/15 0420 03/23/15 1010 03/23/15 1018 03/23/15 1628 03/23/15 2040 03/23/15 2240 03/24/15 0445  HGB 13.3 11.6*  --  12.8*  --   --   --  12.4*  HCT 39.7 35.1*  --  38.5*  --   --   --  37.6*  PLT 107* 109*  --  114*  --   --   --  123*  LABPROT 14.5  --   --   --   --   --   --   --   INR 1.12  --   --   --   --   --   --   --   HEPARINUNFRC  --   --   --   --   --  0.11*  --  0.28*  CREATININE 2.06* 2.38*  --  2.44*  --   --   --   --   TROPONINI  --   --  9.21*  --  23.54*  --  26.86*  --     Estimated Creatinine Clearance: 33.5 mL/min (by C-G formula based on Cr of 2.44).   Medical History: Past Medical History  Diagnosis Date  . Rhabdomyolysis   . Memory loss   . Exogenous obesity   . Insulin dependent diabetes mellitus   . Peripheral neuropathy   . Venous insufficiency   . CAD (coronary artery disease)   . Hyperlipidemia   . Gout   . Stroke     L patietal with small scattered lacunar infarcts  . Left foot drop   . Hypertension   . History of nuclear stress test 07/2011    dipyridamole; fixed inferolateral defect, worse at stress than rest; no reversible ischemia; low risk scan   . OSA on CPAP     uses a cpap  . Heart attack     Medications:  Scheduled:  . amitriptyline  50 mg Oral QHS  . amLODipine  5 mg Oral Daily  . aspirin EC  325 mg Oral Daily  . clopidogrel  75 mg Oral Daily   . donepezil  10 mg Oral Daily  . insulin aspart  0-20 Units Subcutaneous Q4H  . insulin glargine  20 Units Subcutaneous BID  . metoprolol succinate  25 mg Oral Daily  . omega-3 acid ethyl esters  1 g Oral BID  . piperacillin-tazobactam (ZOSYN)  IV  3.375 g Intravenous 3 times per day  . rosuvastatin  20 mg Oral Daily  . tamsulosin  0.4 mg Oral QHS  . vancomycin  1,250 mg Intravenous Q24H    Assessment: 74yoM presented to the ED on 4/7 w/ generalized weakness and confusion. Troponin found to be elevated at 9.21 Pharmacy consulted to start heparin for ACS.  Troponin >> 26.86  Heparin level = 0.28 on 1300 units/hr Hgb 12.4, plts 123K     No bleeding noted per RN's report.  Goal of Therapy:  Heparin level 0.3-0.7 units/ml Monitor platelets by anticoagulation protocol: Yes   Plan:  Heparin bolus 1500 units x1 Increase Heparin gtt to 1500 units/hr 8hr HL @ 1430 Daily HL/CBC Monitor s/sx of bleeding  Thank you for allowing pharmacy to be part of this patient's care team Nicole Cella, RPh Clinical Pharmacist Pager: [phone] 03/24/2015 .6:06 AM

## 2015-03-24 NOTE — Progress Notes (Signed)
Patient ID: Randall Wilkins, male   DOB: [birth year], 74 y.o.   MRN: [id number]    Subjective:  Denies SSCP, palpitations or Dyspnea Family visiting  Feels much better  Objective:  Filed Vitals:   03/24/15 0700 03/24/15 0740 03/24/15 1030 03/24/15 1150  BP:  125/57    Pulse:  63 60   Temp: 97.7 F (36.5 C)   97.9 F (36.6 C)  TempSrc: Oral   Oral  Resp:  28 24   Height:      Weight:      SpO2:  100% 100%     Intake/Output from previous day:  Intake/Output Summary (Last 24 hours) at 03/24/15 1245 Last data filed at 03/24/15 1150  Gross per 24 hour  Intake 1869.97 ml  Output   1475 ml  Net 394.97 ml    Physical Exam: Affect appropriate Obese white male  HEENT: normal Neck supple with no adenopathy JVP normal no bruits no thyromegaly Lungs basilar crackles left greater than right  no wheezing and good diaphragmatic motion Heart:  S1/S2 no murmur, no rub, gallop or click PMI normal Abdomen: benighn, BS positve, no tenderness, no AAA no bruit.  No HSM or HJR Distal pulses intact with no bruits No edema Neuro non-focal Skin warm and dry No muscular weakness   Lab Results: Basic Metabolic Panel:  Recent Labs  03/23/15 1018 03/24/15 0445  NA 135 139  K 3.9 4.0  CL 98 103  CO2 22 21  GLUCOSE 205* 202*  BUN 49* 52*  CREATININE 2.44* 2.08*  CALCIUM 8.3* 8.0*   Liver Function Tests:  Recent Labs  03/21/15 1326 03/23/15 1018  AST 22 67*  ALT 14 22  ALKPHOS 69 64  BILITOT 1.5* 1.1  PROT 6.2 5.9*  ALBUMIN 3.2* 2.6*   CBC:  Recent Labs  03/23/15 1018 03/24/15 0445  WBC 12.0* 10.0  HGB 12.8* 12.4*  HCT 38.5* 37.6*  MCV 90.4 89.1  PLT 114* 123*   Cardiac Enzymes:  Recent Labs  03/23/15 1010 03/23/15 1628 03/23/15 2240  TROPONINI 9.21* 23.54* 26.86*   Hemoglobin A1C:  Recent Labs  03/21/15 1815  HGBA1C 7.7*    Imaging: US Renal  03/24/2015   CLINICAL DATA:  74 year old male with acute renal failure.  EXAM: RENAL/URINARY TRACT  ULTRASOUND COMPLETE  COMPARISON:  None.  FINDINGS: Right Kidney:  Length: 10.2 cm. A 3.8 x 3.9 cm lower pole cyst is present. Echogenicity within normal limits. No mass or hydronephrosis visualized.  Left Kidney:  Length: 11.5 cm. Echogenicity within normal limits. No mass or hydronephrosis visualized.  Bladder:  Appears normal for degree of bladder distention.  IMPRESSION: Right renal cyst, otherwise unremarkable renal ultrasound. No evidence of hydronephrosis.   Electronically Signed   By: Margarette Canada M.D.   On: 03/24/2015 12:18   Dg Chest Port 1 View  03/24/2015   CLINICAL DATA:  Shortness of breath and respiratory failure.  EXAM: PORTABLE CHEST - 1 VIEW  COMPARISON:  03/23/2015 and prior radiographs  FINDINGS: The cardiomediastinal silhouette is unchanged.  Right lung airspace disease is again identified.  No pneumothorax or definite effusion noted.  Elevation of right hemidiaphragm again identified.  Prior cervical fusion changes noted.  IMPRESSION: Right lung airspace disease - not significantly changed.   Electronically Signed   By: Margarette Canada M.D.   On: 03/24/2015 09:57   Dg Chest Port 1 View  03/23/2015   CLINICAL DATA:  Shortness of breath.  EXAM: PORTABLE CHEST -  1 VIEW  COMPARISON:  03/21/2015  FINDINGS: Lungs are hypoinflated and demonstrate a new moderate airspace process over the central right lung. No evidence of effusion or pneumothorax. Cardiomediastinal silhouette and remainder of the exam is unchanged.  IMPRESSION: New moderate airspace process over the central right lung likely infection and less likely asymmetric edema.   Electronically Signed   By: Marin Olp M.D.   On: 03/23/2015 08:12    Cardiac Studies:  ECG:  SR LAFB  Poor R wave progression nonspecific ST changes no acute ST elevation    Telemetry:  Sinus rates 60's  No VT  Echo: 03/22/15 reviewed   Study Conclusions  - Left ventricle: The cavity size was normal. Wall thickness was increased in a pattern of mild LVH.  Systolic function was normal. The estimated ejection fraction was in the range of 55% to 60%. There is hypokinesis of the basalinferolateral myocardium. Doppler parameters are consistent with abnormal left ventricular relaxation (grade 1 diastolic dysfunction). Doppler parameters are consistent with high ventricular filling pressure. - Mitral valve: Calcified annulus.  Impressions:  - Mild hypokinesis of the basal inferolateral wall with overall normal LV function; grade 1 diastolic dysfunction with elevated LV filling pressure; calcified aortic valve; mean gradient of 18 mmHg suggests mild AS but visually valve opens well.  Medications:   . amitriptyline  50 mg Oral QHS  . amLODipine  5 mg Oral Daily  . aspirin EC  325 mg Oral Daily  . clopidogrel  75 mg Oral Daily  . donepezil  10 mg Oral Daily  . insulin aspart  0-20 Units Subcutaneous Q4H  . insulin glargine  20 Units Subcutaneous BID  . metoprolol succinate  25 mg Oral Daily  . omega-3 acid ethyl esters  1 g Oral BID  . piperacillin-tazobactam (ZOSYN)  IV  3.375 g Intravenous 3 times per day  . rosuvastatin  20 mg Oral Daily  . tamsulosin  0.4 mg Oral QHS  . vancomycin  1,250 mg Intravenous Q24H     . sodium chloride 75 mL/hr (03/24/15 1004)  . heparin 1,500 Units/hr (03/24/15 1002)    Assessment/Plan:  CAD:  Distant history of diagonal stent 97 and RCA stent  2005.  Troponin over 20 in setting of respiratory distress and likely mild CHF.  Surprisingly EF ok by echo Continue heparin aspirin and plavix  And beta blocker   No chest pain.  Comorbidities improving  Discussed cath with patient and family  May be ready for by  Tuesday.  Not scheduled yet  CHF:  BNP 700 range CXR no CHF but responded to one dose lasix  Respiratory distress also from ? Aspiration .  I/O positive and Cr improved will give one dose today 20mg  iv  Elevated CR:  Improving [place] no hydronephroses  Wait on cath and constrast load until  at baseline BMET in am   ID:  Aspiration swallowing study pending on empiric antibiotics  Jenkins Rouge 03/24/2015, 12:45 PM

## 2015-03-24 NOTE — Evaluation (Signed)
Occupational Therapy Evaluation Patient Details Name: Randall Wilkins [place] MRN: [id number] DOB: Dec 17, 1940 Today'Wilkins Date: 03/24/2015    History of Present Illness Patient is a 74 yo male admitted 03/21/15 with UTI, confusion, and recent falls.  PMH:  CVA, CAD, MI, DM, peripheral neuropathy, HTN, LLE weakness post rhabdo, OSA on CPAP. 4/9 respiratory issues with increaesed tropin but NO STEMI (per cardiology note feel increased tropin due to respiratory issues)   Clinical Impression   This 74 yo male admitted with above presents to acute OT with decreased standing balance, O2 sat issues overnight, obesity, decreased mobility all affecting his ability to care for himself as he was pta (with only A for some LBD). He will benefit from acute OT with follow up at home.    Follow Up Recommendations  Home health OT    Equipment Recommendations  None recommended by OT       Precautions / Restrictions Precautions Precautions: Fall Precaution Comments: LLE weak and decreased sensation--from PMHx of rhabdomylosis Required Braces or Orthoses: Other Brace/Splint Other Brace/Splint: for LLE (in closet)--he usually wears compression stockings with them which are also in closet Restrictions Weight Bearing Restrictions: No      Mobility Bed Mobility Overal bed mobility: Modified Independent Bed Mobility: Supine to Sit     Supine to sit: Modified independent (Device/Increase time);HOB elevated        Transfers Overall transfer level: Needs assistance Equipment used: Rolling walker (2 wheeled) Transfers: Sit to/from [facility] Sit to Stand: Mod assist Stand pivot transfers: Mod assist            Balance Overall balance assessment: Needs assistance Sitting-balance support: No upper extremity supported;Feet supported Sitting balance-Leahy Scale: Good     Standing balance support: Bilateral upper extremity supported Standing balance-Leahy Scale: Poor                              ADL Overall ADL'Wilkins : Needs assistance/impaired Eating/Feeding: NPO   Grooming: Set up;Sitting   Upper Body Bathing: Set up;Sitting   Lower Body Bathing: Moderate assistance (with Mod A sit<>stand and to maintain standing)   Upper Body Dressing : Set up;Sitting   Lower Body Dressing: Moderate assistance (with Mod A sit<>stand and to maintain standing)   Toilet Transfer: Moderate assistance;Stand-pivot;RW (bed>recliner going to pt'Wilkins right)   Toileting- Clothing Manipulation and Hygiene: Maximal assistance (with Mod A sit<>stand and to maintain standing)               Vision Additional Comments: No change          Pertinent Vitals/Pain Pain Assessment: No/denies pain     Hand Dominance Right   Extremity/Trunk Assessment Upper Extremity Assessment Upper Extremity Assessment: Overall WFL for tasks assessed           Communication Communication Communication: No difficulties   Cognition Arousal/Alertness: Awake/alert Behavior During Therapy: WFL for tasks assessed/performed Overall Cognitive Status: Within Functional Limits for tasks assessed                                Home Living Family/patient expects to be discharged to:: Private residence Living Arrangements: Spouse/significant other Available Help at Discharge: Family;Available 24 hours/day (wife and son) Type of Home: House Home Access: Ramped entrance     Home Layout: One level     Bathroom Shower/Tub: Walk-in Hydrologist: Handicapped height  Home Equipment: Boykins - 2 wheels;Cane - single point;Shower seat;Wheelchair - manual;Wheelchair - power          Prior Functioning/Environment Level of Independence: Needs assistance    ADL'Wilkins / Homemaking Assistance Needed: For compression stockings, LLE brace and shoes   Comments: Wife reports that most of the time in the house he uses a RW    OT Diagnosis: Generalized weakness   OT  Problem List: Decreased strength;Impaired balance (sitting and/or standing);Cardiopulmonary status limiting activity;Obesity   OT Treatment/Interventions: Self-care/ADL training;Energy conservation;DME and/or AE instruction;Patient/family education;Balance training;Therapeutic activities    OT Goals(Current goals can be found in the care plan section) Acute Rehab OT Goals Patient Stated Goal: to get swallow test OT Goal Formulation: With patient/family Time For Goal Achievement: 04/07/15 Potential to Achieve Goals: Good  OT Frequency: Min 2X/week              End of Session Equipment Utilized During Treatment: Rolling walker Nurse Communication: Mobility status  Activity Tolerance: Patient tolerated treatment well Patient left: in chair;with family/visitor present   Time: 3474-2595 OT Time Calculation (min): 24 min Charges:  OT General Charges $OT Visit: 1 Procedure OT Evaluation $Initial OT Evaluation Tier I: 1 Procedure OT Treatments $Self Care/Home Management : 8-22 mins  Almon Register [phone] 03/24/2015, 4:08 PM

## 2015-03-24 NOTE — Consult Note (Signed)
PULMONARY / CRITICAL CARE MEDICINE   Name: Randall Wilkins MRN: [id number] DOB: 18-Jun-1941    ADMISSION DATE:  03/21/2015 CONSULTATION DATE:  03/24/2015  REFERRING MD :  Candiss Norse  CHIEF COMPLAINT:  Short of breath  INITIAL PRESENTATION:  74 yo male former smoker admitted with weakness, confusion, urinary frequency, dyspnea from UTI.  STUDIES:  4/08 Echo >> EF 55 to 54%, grade 1 diastolic dysfx  SIGNIFICANT EVENTS: 4/08 Admit 4/09 Respiratory distress, cardiology consulted, GI consulted  HISTORY OF PRESENT ILLNESS:   74 yo male presented with weakness, confusion, urinary frequency and progressive dyspnea.  He was found to have UTI and started on Abx.  After admission he developed severe respiratory distress with diaphoresis.  He denies chest pain/pressure.  He was started on BiPAP.  CXR was showed asymmetric infiltrates with ?PNA vs pulmonary edema.  He was seen by cardiology and given lasix.  His abx were also increased.  He feels that after lasix his breathing got better.  He has since been transitioned off BiPAP during the course of the night.  He currently feels his breathing is okay at rest.  He denies chest pain, cough, palpitations, abdominal pain.  His WBC has improved since admission, and no fever during hospital stay.  PAST MEDICAL HISTORY :   has a past medical history of Rhabdomyolysis; Memory loss; Exogenous obesity; Insulin dependent diabetes mellitus; Peripheral neuropathy; Venous insufficiency; CAD (coronary artery disease); Hyperlipidemia; Gout; Stroke; Left foot drop; Hypertension; History of nuclear stress test (07/2011); OSA on CPAP; and Heart attack.  has past surgical history that includes Cholecystectomy; Sinus endo w/fusion; Coronary angioplasty (10/13/1996); Rotator cuff repair (Right); transthoracic echocardiogram (08/08/2013); Coronary angioplasty (09/21/1989); Coronary angioplasty (10/13/1996); Coronary angioplasty (12/03/1997); Coronary angioplasty (10/14/1999);  Coronary angioplasty with stent (04/04/2004); Carotid Doppler (03/2013); Back surgery (2002); Tonsillectomy; Colonoscopy; and Carpal tunnel release (Bilateral, 08/09/2014). Prior to Admission medications   Medication Sig Start Date End Date Taking? Authorizing Provider  allopurinol (ZYLOPRIM) 100 MG tablet Take 300 mg by mouth daily.    Yes Historical Provider, MD  amitriptyline (ELAVIL) 50 MG tablet Take 50 mg by mouth at bedtime.   Yes Historical Provider, MD  amLODipine (NORVASC) 5 MG tablet Take 5 mg by mouth daily.   Yes Historical Provider, MD  aspirin EC 81 MG tablet Take 81 mg by mouth daily.   Yes Historical Provider, MD  clopidogrel (PLAVIX) 75 MG tablet Take 75 mg by mouth daily.   Yes Historical Provider, MD  colchicine 0.6 MG tablet Take 0.6 mg by mouth daily as needed.   Yes Historical Provider, MD  donepezil (ARICEPT) 10 MG tablet Take 1 tablet (10 mg total) by mouth daily. 09/10/14  Yes Marcial Pacas, MD  fish oil-omega-3 fatty acids 1000 MG capsule Take 1 g by mouth 2 (two) times daily.    Yes Historical Provider, MD  HYDROcodone-acetaminophen (VICODIN) 5-500 MG per tablet Take 2 tablets by mouth at bedtime.    Yes Historical Provider, MD  Insulin Glargine 300 UNIT/ML SOPN Inject 60 Units into the skin every morning.   Yes Historical Provider, MD  insulin lispro (HUMALOG) 100 UNIT/ML injection Inject 20-25 Units into the skin 4 (four) times daily. Takes 25u in am, 20u at lunch, 20u in evening and 25u at bedtime   Yes Historical Provider, MD  lisinopril (PRINIVIL,ZESTRIL) 5 MG tablet Take 10 mg by mouth daily.    Yes Historical Provider, MD  metoprolol succinate (TOPROL-XL) 25 MG 24 hr tablet Take 25 mg by mouth  daily.   Yes Historical Provider, MD  NON FORMULARY Inhale 1 application into the lungs at bedtime. CPAP   Yes Historical Provider, MD  potassium chloride SA (K-DUR,KLOR-CON) 20 MEQ tablet Take 20 mEq by mouth daily.    Yes Historical Provider, MD  rosuvastatin (CRESTOR) 20 MG  tablet Take 20 mg by mouth daily.   Yes Historical Provider, MD  Tamsulosin HCl (FLOMAX) 0.4 MG CAPS Take 0.4 mg by mouth daily.    Yes Historical Provider, MD  torsemide (DEMADEX) 10 MG tablet Take 50 mg by mouth daily.   Yes Historical Provider, MD   Allergies  Allergen Reactions  . Actos [Pioglitazone] Swelling  . Metformin And Related Nausea Only  . Niaspan [Niacin Er] Itching and Rash    FAMILY HISTORY:  indicated that his mother is deceased. He indicated that his father is deceased.  SOCIAL HISTORY:  reports that he quit smoking about 42 years ago. His smoking use included Cigarettes. He has a 40 pack-year smoking history. He has never used smokeless tobacco. He reports that he does not drink alcohol or use illicit drugs.  REVIEW OF SYSTEMS:   Negative except above.  SUBJECTIVE:   VITAL SIGNS: Temp:  [98.9 F (37.2 C)-100.8 F (38.2 C)] 99.1 F (37.3 C) (04/10 0405) Pulse Rate:  [63-88] 63 (04/10 0405) Resp:  [22-40] 32 (04/10 0405) BP: (111-141)/(48-74) 111/52 mmHg (04/10 0405) SpO2:  [96 %-100 %] 100 % (04/10 0405) FiO2 (%):  [50 %-100 %] 100 % (04/09 1133) Weight:  [256 lb 9.9 oz (116.4 kg)] 256 lb 9.9 oz (116.4 kg) (04/10 0500) INTAKE / OUTPUT:  Intake/Output Summary (Last 24 hours) at 03/24/15 0705 Last data filed at 03/24/15 0630  Gross per 24 hour  Intake 1850.92 ml  Output   1075 ml  Net 775.92 ml    PHYSICAL EXAMINATION: General:  pleasant Neuro:  Awake, normal strength, CN intact HEENT:  No sinus tenderness, no LAN Cardiovascular:  Regular, no murmur Lungs:  Faint basilar crackles, no wheeze Abdomen:  Soft, non tender Musculoskeletal:  No edema Skin:  No rashes  LABS:  CBC  Recent Labs Lab 03/22/15 0420 03/23/15 1018 03/24/15 0445  WBC 16.7* 12.0* 10.0  HGB 11.6* 12.8* 12.4*  HCT 35.1* 38.5* 37.6*  PLT 109* 114* 123*   Coag's  Recent Labs Lab 03/21/15 1326  INR 1.12   BMET  Recent Labs Lab 03/22/15 0420 03/23/15 1018  03/24/15 0445  NA 136 135 139  K 4.0 3.9 4.0  CL 102 98 103  CO2 26 22 21   BUN 38* 49* 52*  CREATININE 2.38* 2.44* 2.08*  GLUCOSE 187* 205* 202*   Electrolytes  Recent Labs Lab 03/22/15 0420 03/23/15 1018 03/24/15 0445  CALCIUM 8.3* 8.3* 8.0*   Sepsis Markers  Recent Labs Lab 03/23/15 1010  LATICACIDVEN 1.3   ABG  Recent Labs Lab 03/23/15 0620 03/23/15 0900  PHART 7.394 7.411  PCO2ART 42.9 39.9  PO2ART 74.1* 98.2   Liver Enzymes  Recent Labs Lab 03/21/15 1326 03/23/15 1018  AST 22 67*  ALT 14 22  ALKPHOS 69 64  BILITOT 1.5* 1.1  ALBUMIN 3.2* 2.6*   Cardiac Enzymes  Recent Labs Lab 03/23/15 1010 03/23/15 1628 03/23/15 2240  TROPONINI 9.21* 23.54* 26.86*   Glucose  Recent Labs Lab 03/23/15 0915 03/23/15 1127 03/23/15 1546 03/23/15 2140 03/23/15 2357 03/24/15 0404  GLUCAP 186* 228* 186* 216* 198* 196*    Imaging Dg Chest Port 1 View  03/23/2015   CLINICAL  DATA:  Shortness of breath.  EXAM: PORTABLE CHEST - 1 VIEW  COMPARISON:  03/21/2015  FINDINGS: Lungs are hypoinflated and demonstrate a new moderate airspace process over the central right lung. No evidence of effusion or pneumothorax. Cardiomediastinal silhouette and remainder of the exam is unchanged.  IMPRESSION: New moderate airspace process over the central right lung likely infection and less likely asymmetric edema.   Electronically Signed   By: Marin Olp M.D.   On: 03/23/2015 08:12     ASSESSMENT / PLAN:  PULMONARY A: Acute respiratory failure >> given rapid improvement after diuresis my suspicion is that he had pulmonary edema rather than aspiration pneumonia. Hx of OSA. P:   Wean oxygen to keep SpO2 > 92% F/u CXR as needed >> unfortunately computer system is not working, and I can not review CXR this AM >> will f/u when radiology computer system is working Diuresis per primary team and cardiology Continue BiPAP/CPAP qhs for OSA and prn for respiratory distress No  indication for intubation at this time  CARDIOVASCULAR A:  NSTEMI with hx of CAD. Hx of HTN, HLD. P:  Heparin gtt per cardiology ASA, plavix, toprol, crestor, norvasc, lovaza per primary team and cardiology  RENAL A:   CKD stage 3. P:   Monitor renal fx  GASTROINTESTINAL A:   Dysphagia with esophageal dysmotility. P:   D3 diet F/u with GI and speech therapy  HEMATOLOGIC A:   No acute issues. P:  F/u CBC  INFECTIOUS A:   E coli UTI. ?aspiration PNA >> seems less likely given rapid improvement in respiratory status. 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 Hx of gout. P:   SSI with lantus Hold allopurinol  NEUROLOGIC A:   Hx of Rt thalamic hemorrhagic infarct 2007, memory loss. P:   Monitor mental status Elavil, aricept  Updated pt's family at bedside.  SUMMARY: Concern from 4/09 through 4/10 about him needing intubation and mechanical ventilation was not born out.  In fact he has considerable improvement in respiratory status, most likely from diuresis.  I suspect his respiratory distress was related to acute pulmonary edema in setting of NSTEMI rather than aspiration pneumonia.  PCCM can be available as needed if his respiratory status gets worse.  Please call if further help needed.  Chesley Mires, MD [facility] Pulmonary/Critical Care 03/24/2015, 7:43 AM Pager:  [phone] After 3pm call: [phone]

## 2015-03-25 DIAGNOSIS — J9601 Acute respiratory failure with hypoxia: Secondary | ICD-10-CM | POA: Diagnosis not present

## 2015-03-25 DIAGNOSIS — N390 Urinary tract infection, site not specified: Principal | ICD-10-CM

## 2015-03-25 DIAGNOSIS — A419 Sepsis, unspecified organism: Secondary | ICD-10-CM | POA: Diagnosis not present

## 2015-03-25 DIAGNOSIS — I214 Non-ST elevation (NSTEMI) myocardial infarction: Secondary | ICD-10-CM | POA: Diagnosis not present

## 2015-03-25 DIAGNOSIS — J9611 Chronic respiratory failure with hypoxia: Secondary | ICD-10-CM | POA: Diagnosis not present

## 2015-03-25 DIAGNOSIS — R0602 Shortness of breath: Secondary | ICD-10-CM | POA: Insufficient documentation

## 2015-03-25 LAB — CBC
HCT: 36.8 % — ABNORMAL LOW (ref 39.0–52.0)
Hemoglobin: 12.1 g/dL — ABNORMAL LOW (ref 13.0–17.0)
MCH: 30 pg (ref 26.0–34.0)
MCHC: 32.9 g/dL (ref 30.0–36.0)
MCV: 91.1 fL (ref 78.0–100.0)
Platelets: 120 10*3/uL — ABNORMAL LOW (ref 150–400)
RBC: 4.04 MIL/uL — ABNORMAL LOW (ref 4.22–5.81)
RDW: 15.3 % (ref 11.5–15.5)
WBC: 8.4 10*3/uL (ref 4.0–10.5)

## 2015-03-25 LAB — GLUCOSE, CAPILLARY
Glucose-Capillary: 97 mg/dL (ref 70–99)
Glucose-Capillary: 103 mg/dL — ABNORMAL HIGH (ref 70–99)
Glucose-Capillary: 260 mg/dL — ABNORMAL HIGH (ref 70–99)
Glucose-Capillary: 258 mg/dL — ABNORMAL HIGH (ref 70–99)
Glucose-Capillary: 175 mg/dL — ABNORMAL HIGH (ref 70–99)

## 2015-03-25 LAB — BASIC METABOLIC PANEL
Anion gap: 4 — ABNORMAL LOW (ref 5–15)
BUN: 50 mg/dL — ABNORMAL HIGH (ref 6–23)
CO2: 33 mmol/L — ABNORMAL HIGH (ref 19–32)
Calcium: 8.5 mg/dL (ref 8.4–10.5)
Chloride: 109 mmol/L (ref 96–112)
Creatinine, Ser: 1.83 mg/dL — ABNORMAL HIGH (ref 0.50–1.35)
GFR calc Af Amer: 40 mL/min — ABNORMAL LOW (ref >90)
GFR calc non Af Amer: 35 mL/min — ABNORMAL LOW (ref >90)
Glucose, Bld: 112 mg/dL — ABNORMAL HIGH (ref 70–99)
Potassium: 3.8 mmol/L (ref 3.5–5.1)
Sodium: 146 mmol/L — ABNORMAL HIGH (ref 135–145)

## 2015-03-25 LAB — HEMOGLOBIN AND HEMATOCRIT, BLOOD
HCT: 50.6 % (ref 39.0–52.0)
Hemoglobin: 15.8 g/dL (ref 13.0–17.0)

## 2015-03-25 LAB — TYPE AND SCREEN
ABO/RH(D): O NEG
Antibody Screen: NEGATIVE

## 2015-03-25 LAB — ABO/RH: ABO/RH(D): O NEG

## 2015-03-25 LAB — HEPARIN LEVEL (UNFRACTIONATED): Heparin Unfractionated: 0.35 IU/mL (ref 0.30–0.70)

## 2015-03-25 MED ORDER — INSULIN ASPART 100 UNIT/ML ~~LOC~~ SOLN
4.0000 [IU] | Freq: Three times a day (TID) | SUBCUTANEOUS | Status: DC
  Administered 2015-03-25 – 2015-03-26 (×5): 4 [IU] via SUBCUTANEOUS
  Administered 2015-03-27: 9 [IU] via SUBCUTANEOUS
  Administered 2015-03-28: 4 [IU] via SUBCUTANEOUS

## 2015-03-25 MED ORDER — INSULIN ASPART 100 UNIT/ML ~~LOC~~ SOLN
0.0000 [IU] | Freq: Three times a day (TID) | SUBCUTANEOUS | Status: DC
  Administered 2015-03-25 (×2): 8 [IU] via SUBCUTANEOUS
  Administered 2015-03-26 (×3): 5 [IU] via SUBCUTANEOUS
  Administered 2015-03-27 (×2): 3 [IU] via SUBCUTANEOUS
  Administered 2015-03-28: 2 [IU] via SUBCUTANEOUS

## 2015-03-25 MED ORDER — PANTOPRAZOLE SODIUM 40 MG IV SOLR
40.0000 mg | Freq: Two times a day (BID) | INTRAVENOUS | Status: DC
  Administered 2015-03-29: 40 mg via INTRAVENOUS
  Filled 2015-03-25 (×3): qty 40

## 2015-03-25 MED ORDER — SODIUM CHLORIDE 0.9 % IV SOLN
80.0000 mg | Freq: Once | INTRAVENOUS | Status: AC
  Administered 2015-03-25: 80 mg via INTRAVENOUS
  Filled 2015-03-25: qty 80

## 2015-03-25 MED ORDER — SODIUM CHLORIDE 0.9 % IV SOLN
8.0000 mg/h | INTRAVENOUS | Status: AC
  Administered 2015-03-25 – 2015-03-28 (×6): 8 mg/h via INTRAVENOUS
  Filled 2015-03-25 (×14): qty 80

## 2015-03-25 MED ORDER — DEXTROSE 5 % IV SOLN
INTRAVENOUS | Status: AC
  Administered 2015-03-25: 75 mL via INTRAVENOUS
  Administered 2015-03-26: 22:00:00 via INTRAVENOUS

## 2015-03-25 MED ORDER — INSULIN ASPART 100 UNIT/ML ~~LOC~~ SOLN: 0.0000 [IU] | Freq: Every day | SUBCUTANEOUS | Status: DC

## 2015-03-25 NOTE — Progress Notes (Signed)
Physical Therapy Treatment Patient Details Name: Randall Wilkins MRN: [id number] DOB: December 26, 1940 Today's Date: 03/25/2015    History of Present Illness Patient is a 74 yo male admitted 03/21/15 with UTI, confusion, and recent falls.  PMH:  CVA, CAD, MI, DM, peripheral neuropathy, HTN, LLE weakness post rhabdo, OSA on CPAP. 4/9 respiratory issues with increaesed tropin but NO STEMI (per cardiology note feel increased tropin due to respiratory issues)    PT Comments    Patient progressing both with ambulation distance and with less assist for transfers.  Able to wear his shoes and socks which also improves balance and safety with ambulation.  Will continue skilled PT while in acute and follow up HHPT at d/c.  Follow Up Recommendations  Home health PT;Supervision/Assistance - 24 hour     Equipment Recommendations  None recommended by PT    Recommendations for Other Services       Precautions / Restrictions Precautions Precautions: Fall Precaution Comments: LLE weak and decreased sensation--from PMHx of rhabdomylosis Required Braces or Orthoses: Other Brace/Splint Other Brace/Splint: double metal upright for LLE (in closet)--he usually wears compression stockings with them which are also in closet    Mobility  Bed Mobility               General bed mobility comments: Patient up in chair  Transfers Overall transfer level: Needs assistance Equipment used: Rolling walker (2 wheeled) Transfers: Sit to/from Stand Sit to Stand: Min assist         General transfer comment: heavy reliance on UE's to stand  Ambulation/Gait Ambulation/Gait assistance: Min assist (+1 for lines (O2, IV, tele)) Ambulation Distance (Feet): 400 Feet Assistive device: Rolling walker (2 wheeled) Gait Pattern/deviations: Step-through pattern;Trunk flexed;Decreased step length - left     General Gait Details: Donned shoes and metal upright AFO for left LE for ambulation.  Patient demonstrates heavy  steps with heavy brace/shoe on left   Stairs            Wheelchair Mobility    Modified Rankin (Stroke Patients Only)       Balance Overall balance assessment: Needs assistance         Standing balance support: Bilateral upper extremity supported Standing balance-Leahy Scale: Poor Standing balance comment: Relies on UE support for balance.  Wife reports at home was able to walk with cane                    Cognition Arousal/Alertness: Awake/alert Behavior During Therapy: WFL for tasks assessed/performed Overall Cognitive Status: Within Functional Limits for tasks assessed                      Exercises      General Comments        Pertinent Vitals/Pain Pain Assessment: No/denies pain    Home Living                      Prior Function            PT Goals (current goals can now be found in the care plan section) Progress towards PT goals: Progressing toward goals    Frequency  Min 3X/week    PT Plan Current plan remains appropriate    Co-evaluation             End of Session Equipment Utilized During Treatment: Gait belt;Oxygen Activity Tolerance: Patient tolerated treatment well Patient left: in chair;with call bell/phone within reach;with family/visitor present  Time: 3888-2800 PT Time Calculation (min) (ACUTE ONLY): 21 min  Charges:  $Gait Training: 8-22 mins                    G Codes:      WYNN,CYNDI 04/08/2015, 4:52 PM  Magda Kiel, [place] 04/08/2015

## 2015-03-25 NOTE — Progress Notes (Signed)
UR COMPLETED  

## 2015-03-25 NOTE — Progress Notes (Signed)
Patient Name: Randall Wilkins Date of Encounter: 03/25/2015     Principal Problem:   UTI (urinary tract infection) Active Problems:   Memory impairment   Hyperlipidemia   Diabetes 1.5, managed as type 1   CVA (cerebral infarction)   CAD (coronary artery disease)   Bilateral leg edema   Confusion   Weakness generalized   Hyperglycemia   Thrombocytopenia   Fall   Hematoma of abdominal wall   Diarrhea   Obstructive sleep apnea   Acute renal failure   Mild diastolic dysfunction   Dyspnea on exertion   SOB (shortness of breath)   Subendocardial MI subsequent episode care    SUBJECTIVE  Feels well this am. No chest pain or dyspnea. No respiratory distress. Renal function improving.  CURRENT MEDS . amitriptyline  50 mg Oral QHS  . amLODipine  5 mg Oral Daily  . aspirin EC  325 mg Oral Daily  . clopidogrel  75 mg Oral Daily  . donepezil  10 mg Oral Daily  . insulin aspart  0-15 Units Subcutaneous TID WC  . insulin aspart  0-5 Units Subcutaneous QHS  . insulin aspart  4 Units Subcutaneous TID WC  . insulin glargine  20 Units Subcutaneous BID  . metoprolol succinate  25 mg Oral Daily  . omega-3 acid ethyl esters  1 g Oral BID  . piperacillin-tazobactam (ZOSYN)  IV  3.375 g Intravenous 3 times per day  . rosuvastatin  20 mg Oral Daily  . tamsulosin  0.4 mg Oral QHS    OBJECTIVE  Filed Vitals:   03/24/15 2335 03/25/15 0359 03/25/15 0500 03/25/15 0700  BP: 135/59 142/60    Pulse: 68     Temp: 98.4 F (36.9 C) 98.2 F (36.8 C)  98 F (36.7 C)  TempSrc: Axillary Oral  Oral  Resp: 35     Height:      Weight:   253 lb 15.5 oz (115.2 kg)   SpO2: 100% 100%      Intake/Output Summary (Last 24 hours) at 03/25/15 0958 Last data filed at 03/25/15 0700  Gross per 24 hour  Intake 2245.13 ml  Output   1876 ml  Net 369.13 ml   Filed Weights   03/21/15 2100 03/24/15 0500 03/25/15 0500  Weight: 258 lb 4.8 oz (117.164 kg) 256 lb 9.9 oz (116.4 kg) 253 lb 15.5 oz (115.2  kg)    PHYSICAL EXAM  General: Pleasant, NAD. Neuro: Alert and oriented X 3. Moves all extremities spontaneously. Psych: Normal affect. HEENT:  Normal  Neck: Supple without bruits or JVD. Lungs:  Resp regular and unlabored, CTA. Heart: RRR no s3, s4, or murmurs. Abdomen: Soft, non-tender, non-distended, BS + x 4.  Extremities: No clubbing, cyanosis or edema. DP/PT/Radials 2+ and equal bilaterally.  Accessory Clinical Findings  CBC  Recent Labs  03/24/15 0445 03/25/15 0324  WBC 10.0 8.4  HGB 12.4* 12.1*  HCT 37.6* 36.8*  MCV 89.1 91.1  PLT 123* 097*   Basic Metabolic Panel  Recent Labs  03/24/15 0445 03/25/15 0324  NA 139 146*  K 4.0 3.8  CL 103 109  CO2 21 33*  GLUCOSE 202* 112*  BUN 52* 50*  CREATININE 2.08* 1.83*  CALCIUM 8.0* 8.5   Liver Function Tests  Recent Labs  03/23/15 1018  AST 67*  ALT 22  ALKPHOS 64  BILITOT 1.1  PROT 5.9*  ALBUMIN 2.6*   No results for input(s): LIPASE, AMYLASE in the last 72 hours. Cardiac  Enzymes  Recent Labs  03/23/15 1010 03/23/15 1628 03/23/15 2240  TROPONINI 9.21* 23.54* 26.86*   BNP Invalid input(s): POCBNP D-Dimer No results for input(s): DDIMER in the last 72 hours. Hemoglobin A1C No results for input(s): HGBA1C in the last 72 hours. Fasting Lipid Panel No results for input(s): CHOL, HDL, LDLCALC, TRIG, CHOLHDL, LDLDIRECT in the last 72 hours. Thyroid Function Tests No results for input(s): TSH, T4TOTAL, T3FREE, THYROIDAB in the last 72 hours.  Invalid input(s): FREET3  TELE  NSR  ECG    Radiology/Studies  X-ray Chest Pa And Lateral  03/22/2015   CLINICAL DATA:  Shortness of breath  EXAM: CHEST  2 VIEW  COMPARISON:  11/14/2009  FINDINGS: No cardiomegaly for technique. Aortic and hilar contours are negative. 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 No edema, consolidation, effusion, or pneumothorax.  IMPRESSION: No active cardiopulmonary disease.   Electronically  Signed   By: Monte Fantasia M.D.   On: 03/22/2015 01:10   Dg Esophagus  02/27/2015   CLINICAL DATA:  Dysphagia  EXAM: ESOPHOGRAM / BARIUM SWALLOW / BARIUM TABLET STUDY  TECHNIQUE: Combined double contrast and single contrast examination performed using effervescent crystals, thick barium liquid, and thin barium liquid. The patient was observed with fluoroscopy swallowing a 27mm barium sulphate tablet.  FLUOROSCOPY TIME:  4 minutes 0 seconds  COMPARISON:  None  FINDINGS: No cervical esophageal narrowing. Cervical plate noted from anterior fusion at C3-4. Separate plate at E1-7.  Fluoroscopic evaluation of swallowing demonstrates disruption of 3 out of 4 primary esophageal peristaltic waves with moderate tertiary contractions. No fold thickening.  There is mild distal esophageal short segment stricture, best seen on the upright views and after administration of the barium tablet. The barium tablet sticks for several minutes in the distal esophagus. The tablet also initially sticks to the right of midline in the cervical esophagus, reproduce in the patient's symptoms. This is presumably within the right piriform sinus. The barium tablet finally passes into the esophagus but lodges distally in area of mild distal esophageal stricture.  IMPRESSION: The barium tablet initially lodges in the right piriform sinus, reproducing the patient's symptoms. This finally passes into the esophagus, but lodges in the distal esophagus at an area of mild short segment distal esophageal stricture.  Esophageal dysmotility with disruption of primary peristaltic waves an moderate tertiary contractions.   Electronically Signed   By: Rolm Baptise M.D.   On: 02/27/2015 12:38   US Renal  03/24/2015   CLINICAL DATA:  74 year old male with acute renal failure.  EXAM: RENAL/URINARY TRACT ULTRASOUND COMPLETE  COMPARISON:  None.  FINDINGS: Right Kidney:  Length: 10.2 cm. A 3.8 x 3.9 cm lower pole cyst is present. Echogenicity within normal  limits. No mass or hydronephrosis visualized.  Left Kidney:  Length: 11.5 cm. Echogenicity within normal limits. No mass or hydronephrosis visualized.  Bladder:  Appears normal for degree of bladder distention.  IMPRESSION: Right renal cyst, otherwise unremarkable renal ultrasound. No evidence of hydronephrosis.   Electronically Signed   By: Margarette Canada M.D.   On: 03/24/2015 12:18   Dg Chest Port 1 View  03/24/2015   CLINICAL DATA:  Shortness of breath and respiratory failure.  EXAM: PORTABLE CHEST - 1 VIEW  COMPARISON:  03/23/2015 and prior radiographs  FINDINGS: The cardiomediastinal silhouette is unchanged.  Right lung airspace disease is again identified.  No pneumothorax or definite effusion noted.  Elevation of right hemidiaphragm again identified.  Prior cervical fusion changes  noted.  IMPRESSION: Right lung airspace disease - not significantly changed.   Electronically Signed   By: Margarette Canada M.D.   On: 03/24/2015 09:57   Dg Chest Port 1 View  03/23/2015   CLINICAL DATA:  Shortness of breath.  EXAM: PORTABLE CHEST - 1 VIEW  COMPARISON:  03/21/2015  FINDINGS: Lungs are hypoinflated and demonstrate a new moderate airspace process over the central right lung. No evidence of effusion or pneumothorax. Cardiomediastinal silhouette and remainder of the exam is unchanged.  IMPRESSION: New moderate airspace process over the central right lung likely infection and less likely asymmetric edema.   Electronically Signed   By: Marin Olp M.D.   On: 03/23/2015 08:12    ASSESSMENT AND PLAN  1. NSTEMI occurring in the setting of acute respiratory distress. Acute dyspnea responded to restarting diuretic.Distant history of diagonal stent 75 and RCA stent 2005. Troponin over 20  2. CAD. Distant history of diagonal stent 7 and RCA stent 2005. Distant history of diagonal stent 41 and RCA stent 2005.  3. Essential hypertension 4. Dyslipidemia 5. Diabetes with diabetic neuropathy 6. Memory  disorder  Plan:  The patient should have cardiac cath if renal function improves further. Will continue to monitor BMET daily. Possible cath/PCI Tuesday if renal function allows. Will hold breakfast Tuesday am. No lasix today. Signed, Darlin Coco MD

## 2015-03-25 NOTE — Progress Notes (Signed)
ANTICOAGULATION CONSULT NOTE - Follow up [place] for Heparin Indication: chest pain/ACS  Allergies  Allergen Reactions  . Actos [Pioglitazone] Swelling  . Metformin And Related Nausea Only  . Niaspan [Niacin Er] Itching and Rash    Patient Measurements: Height: 5\' 9"  (175.3 cm) Weight: 253 lb 15.5 oz (115.2 kg) IBW/kg (Calculated) : 70.7 Heparin Dosing Weight: 97kg   Vital Signs: Temp: 98 F (36.7 C) (04/11 0700) Temp Source: Oral (04/11 0700) BP: 142/60 mmHg (04/11 0359) Pulse Rate: 68 (04/10 2335)  Labs:  Recent Labs  03/23/15 1010  03/23/15 1018 03/23/15 1628  03/23/15 2240 03/24/15 0445 03/24/15 1530 03/25/15 0324  HGB  --   < > 12.8*  --   --   --  12.4*  --  12.1*  HCT  --   --  38.5*  --   --   --  37.6*  --  36.8*  PLT  --   --  114*  --   --   --  123*  --  120*  HEPARINUNFRC  --   --   --   --   < >  --  0.28* 0.30 0.35  CREATININE  --   --  2.44*  --   --   --  2.08*  --  1.83*  TROPONINI 9.21*  --   --  23.54*  --  26.86*  --   --   --   < > = values in this interval not displayed.  Estimated Creatinine Clearance: 44.3 mL/min (by C-G formula based on Cr of 1.83).   Medications:  Infusions:  . dextrose    . heparin 1,650 Units/hr (03/24/15 2015)    Assessment: 74yoM presented to the ED on 4/7 w/ generalized weakness and confusion. Troponin found to be elevated at 9.21 and pharmacy consulted to start heparin for ACS.  Troponin >> 26.86  Heparin level therapeutic CBC stable  Goal of Therapy:  Heparin level 0.3-0.7 units/ml Monitor platelets by anticoagulation protocol: Yes   Plan:  Continue heparin at 1650 units / hr Follow up AM labs Follow up plans for cath  Thank you Anette Guarneri, PharmD [phone] 03/25/2015 10:30 AM

## 2015-03-25 NOTE — Progress Notes (Signed)
Heparin gtt dc due to loose black tarry stools. H&H ordered, hemoccult to be collected. Protonix gtt started. Vitals stable. Will continue to monitor.

## 2015-03-25 NOTE — Care Management Note (Signed)
    Page 1 of 1   03/29/2015     10:05:04 AM CARE MANAGEMENT NOTE 03/29/2015  Patient:  Randall Wilkins, Randall Wilkins   Account Number:  [id number]  Date Initiated:  03/25/2015  Documentation initiated by:  Whitman Hero  Subjective/Objective Assessment:   PTA  from home with wife , admitted with uti / nstemi.     Action/Plan:   Return to home when medically stable. CM to f/u with disposition needs.   Anticipated DC Date:     Anticipated DC Plan:  HOME/SELF CARE      DC Planning Services  CM consult      Choice offered to / List presented to:             Status of service:  Completed, signed off Medicare Important Message given?  YES (If response is "NO", the following Medicare IM given date fields will be blank) Date Medicare IM given:  03/25/2015 Medicare IM given by:   Date Additional Medicare IM given:  03/29/2015 Additional Medicare IM given by:  [facility] Shaurya Rawdon  Discharge Disposition:    Per UR Regulation:  Reviewed for med. necessity/level of care/duration of stay  If discussed at [place] of Stay Meetings, dates discussed:    Comments:  4/14 1602 debbie Jyquan Kenley rn,bsn pt adm to [facility] post cath. pos cath w left main for cvst consult. has been on plavix. will moniter for needs as pt progresses.

## 2015-03-25 NOTE — Progress Notes (Signed)
Swallowing study results noted and case discussed with my partner Dr. Michail Sermon and hospital computer chart and office computer chart reviewed and please call me if I can help during this hospital stay otherwise follow-up with my partner Dr. Oletta Lamas as an outpatient to evaluate his swallowing further otherwise will wait on cardiology workup and other medical issues to improve

## 2015-03-25 NOTE — Progress Notes (Signed)
Speech Language Pathology Treatment: Dysphagia  Patient Details Name: Randall Wilkins MRN: [id number] DOB: [birth year] Today's Date: 03/25/2015 Time: 7408-1448 SLP Time Calculation (min) (ACUTE ONLY): 16 min  Assessment / Plan / Recommendation Clinical Impression  F/u for dysphagia: Pt's respiratory issues are much improved.  Upon arrival, pt sitting in chair, tolerating nasal cannula.  Reassessment of swallow indicated sufficient mastication, consistent swallow response with necessary exhalation observed after all swallows.  Consumption of water from straw revealed no overt difficulty; no s/s of aspiration.  Difficulty with swallowing over the weekend was likely due to compromised breathing rather than a primary dysphagia.  Recommend resuming a regular diet, thin liquids.  Meds should be crushed in puree (given recent esophagram with barium tablet lodging in pyriforms as well as distal esophagus).   MBS is no longer warranted.  Pt/wife in agreement. SLP to sign off.    HPI HPI: 74 yo male presented with weakness, confusion, urinary frequency and progressive dyspnea. He was found to have UTI and started on Abx. After admission he developed severe respiratory distress with diaphoresis. He denies chest pain/pressure. He was started on BiPAP. CXR was showed asymmetric infiltrates with ?PNA vs pulmonary edema; PCCM suspects pulm edema due to rapid improvement. Pt with Hx of Rt thalamic hemorrhagic infarct 2007, memory loss.Hx cervical fusion C3-4, C5-6. Esophagram 02/27/15: "Barium tablet initially lodges in the right piriform sinus, reproducing the patient's symptoms. This finally passes into the esophagus, but lodges in the distal esophagus at an area of mild short segment distal esophageal stricture.Esophageal dysmotility with disruption of primary peristaltic waves"   Pertinent Vitals Pain Assessment: No/denies pain  SLP Plan  Discharge SLP treatment due to (comment)    Recommendations Diet  recommendations: Regular;Thin liquid Liquids provided via: Cup;Straw Medication Administration: Crushed with puree Supervision: Patient able to self feed Postural Changes and/or Swallow Maneuvers: Seated upright 90 degrees;Upright 30-60 min after meal              Oral Care Recommendations: Oral care BID Follow up Recommendations: None Plan: Discharge SLP treatment due to (comment)   Randall Wilkins L. Tivis Ringer, [place] CCC/SLP Pager [phone]      Randall Wilkins 03/25/2015, 9:26 AM

## 2015-03-25 NOTE — Progress Notes (Addendum)
Patient Demographics  Randall Wilkins, is a 74 y.o. male, DOB - 1941/01/03, POE:[id number]  Admit date - 03/21/2015   Admitting Physician Costin Karlyne Greenspan, MD  Outpatient Primary MD for the patient is Simona Huh, MD  LOS - 4   Chief Complaint  Patient presents with  . Urinary Frequency  . Altered Mental Status        Subjective:   Randall Wilkins today has, No headache, No chest pain, No abdominal pain - No Nausea, No new weakness tingling or numbness, No Cough, much improved shortness of breath.   Assessment & Plan    1.Acute Hypoxic Resp Failure - due to NSTEMI + Aspiration HCAP - post BIPAP 36hrs.    A. Cardiology consulted, continue on heparin drip per MI protocol, on aspirin, Plavix, statin and beta blocker, he is chest pain-free. Echo done earlier this admission reviewed. EF 55-60% does have baseline chronic hypokinesis. Troponin trend now stabilizing. Certainly had NSTEMI. Likely MI brought in by severe hypoxia from pneumonia in the presence of underlying CAD.   B. Aspiration pneumonia. History of dysphagia, he remembers choking on food night of 03/22/2015, right middle lobe opacity, Leukocytosis, fever of 101 on 03/23/2015 with hypoxic respiratory failure. We will follow blood and sputum cultures, was on IV antibiotics per HCAP protocol, will taper ABX as clinically much improved on 03/25/2015, now off BiPAP, on nasal cannula oxygen along with nebulizer treatments and supportive care as needed.    ADDENDUM  -  pt had 2 black tarry stool late in evening, was called at 5.05pm, specimens have been discarded, informed both GI and Cards - stop Hep-ASA, IV PPI, type screen, H&H, clear liquids. GI to see in am,    Goal HB >9     2. Acute renal failure on chronic kidney disease stage III.  Baseline creatinine close to 1.5. Ur Sodium was less than 10 on admission, likely dehydration, improving with IV fluids, switched to D5 as sodium slightly high.    3. DM type II. She on Lantus along with sliding scale, will add pre-meal NovoLog monitor sugars closely.  Lab Results  Component Value Date   HGBA1C 7.7* 03/21/2015    CBG (last 3)   Recent Labs  03/24/15 2338 03/25/15 0353 03/25/15 0741  GLUCAP 124* 97 103*     4.Toxic encephalopathy due to UTI upon admission. Has resolved. Is baseline. Continue antibiotics as in #1 above.    5.Dyslipidemia - on statin.    6.CAD - as in #1.    7. Dysphagia. GI following. Has long-standing history of dysphagia after an episode of esophageal rupture in the past. Was nothing by mouth for 2 days per speech therapy. On 03/25/2015 swallowing improved and now placed on regular diet per speech. Continue to monitor.     Code Status: Full  Family Communication: Wife, son  - clearly explained poor prognosis and critical state.  Disposition Plan: TBD   Procedures   TTE  - Left ventricle: The cavity size was normal. Wall thickness wasincreased in a pattern of mild LVH. Systolic function was normal.The estimated ejection fraction was in the range of 55% to 60%.There is hypokinesis of the basalinferolateral myocardium.Doppler parameters are consistent with abnormal left ventricularrelaxation (grade 1 diastolic  dysfunction). Doppler parametersare consistent with high ventricular filling pressure. - Mitral valve: Calcified annulus.  Impressions: Mild hypokinesis of the basal inferolateral wall with overallnormal LV function; grade 1 diastolic dysfunction with elevatedLV filling pressure; calcified aortic valve; mean gradient of 72mmHg suggests mild AS but visually valve opens well.   Consults  Cards, GI, PCCM   Medications  Scheduled Meds: . amitriptyline  50 mg Oral QHS  . amLODipine  5 mg Oral Daily  . aspirin EC   325 mg Oral Daily  . clopidogrel  75 mg Oral Daily  . donepezil  10 mg Oral Daily  . insulin aspart  0-20 Units Subcutaneous Q4H  . insulin glargine  20 Units Subcutaneous BID  . metoprolol succinate  25 mg Oral Daily  . omega-3 acid ethyl esters  1 g Oral BID  . piperacillin-tazobactam (ZOSYN)  IV  3.375 g Intravenous 3 times per day  . rosuvastatin  20 mg Oral Daily  . tamsulosin  0.4 mg Oral QHS  . vancomycin  1,250 mg Intravenous Q24H   Continuous Infusions: . dextrose    . heparin 1,650 Units/hr (03/24/15 2015)   PRN Meds:.acetaminophen **OR** acetaminophen, albuterol, alum & mag hydroxide-simeth, feeding supplement (GLUCERNA SHAKE), ondansetron **OR** ondansetron (ZOFRAN) IV, oxyCODONE  DVT Prophylaxis  Heparin - SCDs   Lab Results  Component Value Date   PLT 120* 03/25/2015    Antibiotics     Anti-infectives    Start     Dose/Rate Route Frequency Ordered Stop   03/23/15 0845  vancomycin (VANCOCIN) 1,250 mg in sodium chloride 0.9 % 250 mL IVPB     1,250 mg 166.7 mL/hr over 90 Minutes Intravenous Every 24 hours 03/23/15 0830     03/23/15 0845  piperacillin-tazobactam (ZOSYN) IVPB 3.375 g     3.375 g 12.5 mL/hr over 240 Minutes Intravenous 3 times per day 03/23/15 0830     03/22/15 1500  cefTRIAXone (ROCEPHIN) 1 g in dextrose 5 % 50 mL IVPB - Premix  Status:  Discontinued     1 g 100 mL/hr over 30 Minutes Intravenous Every 24 hours 03/21/15 1710 03/23/15 0752   03/21/15 1500  cefTRIAXone (ROCEPHIN) 1 g in dextrose 5 % 50 mL IVPB     1 g 100 mL/hr over 30 Minutes Intravenous  Once 03/21/15 1452 03/21/15 1540          Objective:   Filed Vitals:   03/24/15 2335 03/25/15 0359 03/25/15 0500 03/25/15 0700  BP: 135/59 142/60    Pulse: 68     Temp: 98.4 F (36.9 C) 98.2 F (36.8 C)  98 F (36.7 C)  TempSrc: Axillary Oral  Oral  Resp: 35     Height:      Weight:   115.2 kg (253 lb 15.5 oz)   SpO2: 100% 100%      Wt Readings from Last 3 Encounters:  03/25/15  115.2 kg (253 lb 15.5 oz)  12/11/14 117.935 kg (260 lb)  11/14/14 116.62 kg (257 lb 1.6 oz)     Intake/Output Summary (Last 24 hours) at 03/25/15 0948 Last data filed at 03/25/15 0700  Gross per 24 hour  Intake 2245.13 ml  Output   1876 ml  Net 369.13 ml     Physical Exam  Awake Alert, Oriented X 3, No new F.N deficits, Normal affect,  [place].AT,PERRAL Supple Neck,No JVD, No cervical lymphadenopathy appriciated.  Symmetrical Chest wall movement, Good air movement bilaterally, RML rales improved RRR,No Gallops,Rubs or new Murmurs, No Parasternal  Heave +ve B.Sounds, Abd Soft, No tenderness, No organomegaly appriciated, No rebound - guarding or rigidity. No Cyanosis, Clubbing or edema, No new Rash or bruise     Data Review   Micro Results Recent Results (from the past 240 hour(s))  Urine culture     Status: None   Collection Time: 03/21/15  3:02 PM  Result Value Ref Range Status   Specimen Description URINE, CLEAN CATCH  Final   Special Requests NONE  Final   Colony Count   Final    >=100,000 COLONIES/ML Performed at [facility]    Culture   Final    ESCHERICHIA COLI Performed at [facility]    Report Status 03/23/2015 FINAL  Final   Organism ID, Bacteria ESCHERICHIA COLI  Final      Susceptibility   Escherichia coli - MIC*    AMPICILLIN 8 SENSITIVE Sensitive     CEFAZOLIN <=4 SENSITIVE Sensitive     CEFTRIAXONE <=1 SENSITIVE Sensitive     CIPROFLOXACIN >=4 RESISTANT Resistant     GENTAMICIN <=1 SENSITIVE Sensitive     LEVOFLOXACIN >=8 RESISTANT Resistant     NITROFURANTOIN 64 INTERMEDIATE Intermediate     TOBRAMYCIN <=1 SENSITIVE Sensitive     TRIMETH/SULFA <=20 SENSITIVE Sensitive     PIP/TAZO <=4 SENSITIVE Sensitive     * ESCHERICHIA COLI  Urine culture     Status: None   Collection Time: 03/21/15  6:00 PM  Result Value Ref Range Status   Specimen Description URINE, RANDOM  Final   Special Requests NONE  Final   Colony Count NO  GROWTH Performed at [facility]   Final   Culture NO GROWTH Performed at [facility]   Final   Report Status 03/23/2015 FINAL  Final  Clostridium Difficile by PCR     Status: None   Collection Time: 03/21/15  9:33 PM  Result Value Ref Range Status   C difficile by pcr NEGATIVE NEGATIVE Final  MRSA PCR Screening     Status: None   Collection Time: 03/23/15  8:06 AM  Result Value Ref Range Status   MRSA by PCR NEGATIVE NEGATIVE Final    Comment:        The GeneXpert MRSA Assay (FDA approved for NASAL specimens only), is one component of a comprehensive MRSA colonization surveillance program. It is not intended to diagnose MRSA infection nor to guide or monitor treatment for MRSA infections.   Culture, blood (routine x 2)     Status: None (Preliminary result)   Collection Time: 03/23/15  8:25 AM  Result Value Ref Range Status   Specimen Description BLOOD RIGHT ARM  Final   Special Requests BOTTLES DRAWN AEROBIC AND ANAEROBIC 10CC  Final   Culture   Final           BLOOD CULTURE RECEIVED NO GROWTH TO DATE CULTURE WILL BE HELD FOR 5 DAYS BEFORE ISSUING A FINAL NEGATIVE REPORT Performed at [facility]    Report Status PENDING  Incomplete  Culture, blood (routine x 2)     Status: None (Preliminary result)   Collection Time: 03/23/15  8:32 AM  Result Value Ref Range Status   Specimen Description BLOOD RIGHT ARM  Final   Special Requests BOTTLES DRAWN AEROBIC AND ANAEROBIC [place]  Final   Culture   Final           BLOOD CULTURE RECEIVED NO GROWTH TO DATE CULTURE WILL BE HELD FOR 5 DAYS BEFORE ISSUING  A FINAL NEGATIVE REPORT Performed at [facility]    Report Status PENDING  Incomplete  Culture, blood (routine x 2)     Status: None (Preliminary result)   Collection Time: 03/23/15 10:00 AM  Result Value Ref Range Status   Specimen Description BLOOD LEFT HAND  Final   Special Requests BOTTLES DRAWN AEROBIC ONLY 2CC  Final   Culture   Final            BLOOD CULTURE RECEIVED NO GROWTH TO DATE CULTURE WILL BE HELD FOR 5 DAYS BEFORE ISSUING A FINAL NEGATIVE REPORT Performed at [facility]    Report Status PENDING  Incomplete  Culture, blood (routine x 2)     Status: None (Preliminary result)   Collection Time: 03/23/15 10:15 AM  Result Value Ref Range Status   Specimen Description BLOOD RIGHT HAND  Final   Special Requests BOTTLES DRAWN AEROBIC ONLY 2CC  Final   Culture   Final           BLOOD CULTURE RECEIVED NO GROWTH TO DATE CULTURE WILL BE HELD FOR 5 DAYS BEFORE ISSUING A FINAL NEGATIVE REPORT Performed at [facility]    Report Status PENDING  Incomplete    Radiology Reports X-ray Chest Pa And Lateral  03/22/2015   CLINICAL DATA:  Shortness of breath  EXAM: CHEST  2 VIEW  COMPARISON:  11/14/2009  FINDINGS: No cardiomegaly for technique. Aortic and hilar contours are negative. 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 No edema, consolidation, effusion, or pneumothorax.  IMPRESSION: No active cardiopulmonary disease.   Electronically Signed   By: Monte Fantasia M.D.   On: 03/22/2015 01:10   Dg Esophagus  02/27/2015   CLINICAL DATA:  Dysphagia  EXAM: ESOPHOGRAM / BARIUM SWALLOW / BARIUM TABLET STUDY  TECHNIQUE: Combined double contrast and single contrast examination performed using effervescent crystals, thick barium liquid, and thin barium liquid. The patient was observed with fluoroscopy swallowing a 37mm barium sulphate tablet.  FLUOROSCOPY TIME:  4 minutes 0 seconds  COMPARISON:  None  FINDINGS: No cervical esophageal narrowing. Cervical plate noted from anterior fusion at C3-4. Separate plate at S3-4.  Fluoroscopic evaluation of swallowing demonstrates disruption of 3 out of 4 primary esophageal peristaltic waves with moderate tertiary contractions. No fold thickening.  There is mild distal esophageal short segment stricture, best seen on the upright views and after administration of the  barium tablet. The barium tablet sticks for several minutes in the distal esophagus. The tablet also initially sticks to the right of midline in the cervical esophagus, reproduce in the patient's symptoms. This is presumably within the right piriform sinus. The barium tablet finally passes into the esophagus but lodges distally in area of mild distal esophageal stricture.  IMPRESSION: The barium tablet initially lodges in the right piriform sinus, reproducing the patient's symptoms. This finally passes into the esophagus, but lodges in the distal esophagus at an area of mild short segment distal esophageal stricture.  Esophageal dysmotility with disruption of primary peristaltic waves an moderate tertiary contractions.   Electronically Signed   By: Rolm Baptise M.D.   On: 02/27/2015 12:38   US Renal  03/24/2015   CLINICAL DATA:  74 year old male with acute renal failure.  EXAM: RENAL/URINARY TRACT ULTRASOUND COMPLETE  COMPARISON:  None.  FINDINGS: Right Kidney:  Length: 10.2 cm. A 3.8 x 3.9 cm lower pole cyst is present. Echogenicity within normal limits. No mass or hydronephrosis visualized.  Left Kidney:  Length: 11.5 cm. Echogenicity within normal limits. No mass or hydronephrosis visualized.  Bladder:  Appears normal for degree of bladder distention.  IMPRESSION: Right renal cyst, otherwise unremarkable renal ultrasound. No evidence of hydronephrosis.   Electronically Signed   By: Margarette Canada M.D.   On: 03/24/2015 12:18   Dg Chest Port 1 View  03/24/2015   CLINICAL DATA:  Shortness of breath and respiratory failure.  EXAM: PORTABLE CHEST - 1 VIEW  COMPARISON:  03/23/2015 and prior radiographs  FINDINGS: The cardiomediastinal silhouette is unchanged.  Right lung airspace disease is again identified.  No pneumothorax or definite effusion noted.  Elevation of right hemidiaphragm again identified.  Prior cervical fusion changes noted.  IMPRESSION: Right lung airspace disease - not significantly changed.    Electronically Signed   By: Margarette Canada M.D.   On: 03/24/2015 09:57   Dg Chest Port 1 View  03/23/2015   CLINICAL DATA:  Shortness of breath.  EXAM: PORTABLE CHEST - 1 VIEW  COMPARISON:  03/21/2015  FINDINGS: Lungs are hypoinflated and demonstrate a new moderate airspace process over the central right lung. No evidence of effusion or pneumothorax. Cardiomediastinal silhouette and remainder of the exam is unchanged.  IMPRESSION: New moderate airspace process over the central right lung likely infection and less likely asymmetric edema.   Electronically Signed   By: Marin Olp M.D.   On: 03/23/2015 08:12     CBC  Recent Labs Lab 03/21/15 1326 03/22/15 0420 03/23/15 1018 03/24/15 0445 03/25/15 0324  WBC 18.4* 16.7* 12.0* 10.0 8.4  HGB 13.3 11.6* 12.8* 12.4* 12.1*  HCT 39.7 35.1* 38.5* 37.6* 36.8*  PLT 107* 109* 114* 123* 120*  MCV 90.6 90.0 90.4 89.1 91.1  MCH 30.4 29.7 30.0 29.4 30.0  MCHC 33.5 33.0 33.2 33.0 32.9  RDW 14.9 15.1 15.1 15.2 15.3    Chemistries   Recent Labs Lab 03/21/15 1326 03/22/15 0420 03/23/15 1018 03/24/15 0445 03/25/15 0324  NA 136 136 135 139 146*  K 3.9 4.0 3.9 4.0 3.8  CL 99 102 98 103 109  CO2 24 26 22 21  33*  GLUCOSE 299* 187* 205* 202* 112*  BUN 27* 38* 49* 52* 50*  CREATININE 2.06* 2.38* 2.44* 2.08* 1.83*  CALCIUM 8.6 8.3* 8.3* 8.0* 8.5  AST 22  --  67*  --   --   ALT 14  --  22  --   --   ALKPHOS 69  --  64  --   --   BILITOT 1.5*  --  1.1  --   --    ------------------------------------------------------------------------------------------------------------------ estimated creatinine clearance is 44.3 mL/min (by C-G formula based on Cr of 1.83). ------------------------------------------------------------------------------------------------------------------ No results for input(s): HGBA1C in the last 72 hours. ------------------------------------------------------------------------------------------------------------------ No results  for input(s): CHOL, HDL, LDLCALC, TRIG, CHOLHDL, LDLDIRECT in the last 72 hours. ------------------------------------------------------------------------------------------------------------------ No results for input(s): TSH, T4TOTAL, T3FREE, THYROIDAB in the last 72 hours.  Invalid input(s): FREET3 ------------------------------------------------------------------------------------------------------------------ No results for input(s): VITAMINB12, FOLATE, FERRITIN, TIBC, IRON, RETICCTPCT in the last 72 hours.  Coagulation profile  Recent Labs Lab 03/21/15 1326  INR 1.12    No results for input(s): DDIMER in the last 72 hours.  Cardiac Enzymes  Recent Labs Lab 03/23/15 1010 03/23/15 1628 03/23/15 2240  TROPONINI 9.21* 23.54* 26.86*   ------------------------------------------------------------------------------------------------------------------ Invalid input(s): POCBNP   Time Spent in minutes   35   Lala Lund K M.D on 03/25/2015 at 9:48 AM  Between 7am to 7pm - Pager - [phone]  After 7pm go to [email or web address] - password [facility]  [facility]   Office  [phone]

## 2015-03-25 NOTE — Progress Notes (Signed)
Notified md of pt's large black loose BM.

## 2015-03-26 DIAGNOSIS — R600 Localized edema: Secondary | ICD-10-CM

## 2015-03-26 DIAGNOSIS — N179 Acute kidney failure, unspecified: Secondary | ICD-10-CM | POA: Diagnosis present

## 2015-03-26 LAB — CBC
HCT: 34.6 % — ABNORMAL LOW (ref 39.0–52.0)
Hemoglobin: 11.3 g/dL — ABNORMAL LOW (ref 13.0–17.0)
MCH: 29.7 pg (ref 26.0–34.0)
MCHC: 32.7 g/dL (ref 30.0–36.0)
MCV: 91.1 fL (ref 78.0–100.0)
Platelets: 127 10*3/uL — ABNORMAL LOW (ref 150–400)
RBC: 3.8 MIL/uL — ABNORMAL LOW (ref 4.22–5.81)
RDW: 15.4 % (ref 11.5–15.5)
WBC: 6.9 10*3/uL (ref 4.0–10.5)

## 2015-03-26 LAB — GLUCOSE, CAPILLARY
Glucose-Capillary: 208 mg/dL — ABNORMAL HIGH (ref 70–99)
Glucose-Capillary: 203 mg/dL — ABNORMAL HIGH (ref 70–99)
Glucose-Capillary: 225 mg/dL — ABNORMAL HIGH (ref 70–99)
Glucose-Capillary: 175 mg/dL — ABNORMAL HIGH (ref 70–99)

## 2015-03-26 LAB — OCCULT BLOOD X 1 CARD TO LAB, STOOL: Fecal Occult Bld: POSITIVE — AB

## 2015-03-26 LAB — HEMOGLOBIN AND HEMATOCRIT, BLOOD
HCT: 33.4 % — ABNORMAL LOW (ref 39.0–52.0)
Hemoglobin: 10.8 g/dL — ABNORMAL LOW (ref 13.0–17.0)

## 2015-03-26 LAB — BASIC METABOLIC PANEL
Anion gap: 9 (ref 5–15)
BUN: 41 mg/dL — ABNORMAL HIGH (ref 6–23)
CO2: 25 mmol/L (ref 19–32)
Calcium: 8.5 mg/dL (ref 8.4–10.5)
Chloride: 108 mmol/L (ref 96–112)
Creatinine, Ser: 1.63 mg/dL — ABNORMAL HIGH (ref 0.50–1.35)
GFR calc Af Amer: 46 mL/min — ABNORMAL LOW (ref >90)
GFR calc non Af Amer: 40 mL/min — ABNORMAL LOW (ref >90)
Glucose, Bld: 230 mg/dL — ABNORMAL HIGH (ref 70–99)
Potassium: 3.6 mmol/L (ref 3.5–5.1)
Sodium: 142 mmol/L (ref 135–145)

## 2015-03-26 NOTE — Progress Notes (Addendum)
Patient Demographics  Randall Wilkins, is a 74 y.o. male, DOB - 26-Mar-1941, WIO:[id number]  Admit date - 03/21/2015   Admitting Physician Costin Karlyne Greenspan, MD  Outpatient Primary MD for the patient is Simona Huh, MD  LOS - 5   Chief Complaint  Patient presents with  . Urinary Frequency  . Altered Mental Status      Summary  74 y.o. male with a PMH of stroke, MI, CAD, DM, Rhabdomyolysis, dysphagia, short term memory loss and hypertension who was admitted for encephalopathy caused by UTI. He subsequently developed aspiration pneumonia, hypoxic respiratory failure, MI, upper GI bleed. He was transferred to stepdown unit. GI-cardiology are following the patient. He is due for EGD on 03/26/2015 followed by possible left heart cath later during this admission.   Subjective:   Randall Wilkins today has, No headache, No chest pain, No abdominal pain - No Nausea, No new weakness tingling or numbness, No Cough, much improved shortness of breath. 2 Episodes of loose black stools on 03/25/2015.  Assessment & Plan    1.Acute Hypoxic Resp Failure - due to NSTEMI + Aspiration HCAP - post BIPAP 36hrs.    A. Cardiology consulted . Certainly had NSTEMI. Cardiology following, currently only on Plavix as aspirin and heparin drip had to be stopped due to an episode of upper GI bleed. Remained chest pain-free throughout. We'll likely need left heart cath after cleared by GI from upper GI bleed standpoint. Likely MI brought in by severe hypoxia from pneumonia in the presence of underlying CAD.   B. Aspiration pneumonia. History of dysphagia, he remembers choking on food night of 03/22/2015, right middle lobe opacity, Leukocytosis, fever of 101 on 03/23/2015 with hypoxic respiratory failure. We will follow blood and  sputum cultures, was on IV antibiotics per HCAP protocol, will taper ABX as clinically much improved on 03/25/2015, now off BiPAP, on nasal cannula oxygen along with nebulizer treatments and supportive care as needed.     2. 2 episodes of black loose stools on 03/25/2015. H&H is only mildly dropped, was Hemoccult positive, aspirin and heparin had to be stopped. GI informed. Will likely require EGD. Currently on IV PPI. Type screen done. For now H&H is stable. Once cleared by GI he might require left heart cath for NSTEMI workup.     3. Acute renal failure on chronic kidney disease stage III. Baseline creatinine close to 1.5. Ur Sodium was less than 10 on admission, likely dehydration, improving with IV fluids, switched to D5 as sodium slightly high.    4. DM type II. She on Lantus along with sliding scale, will add pre-meal NovoLog monitor sugars closely.  Lab Results  Component Value Date   HGBA1C 7.7* 03/21/2015    CBG (last 3)   Recent Labs  03/25/15 1637 03/25/15 1942 03/26/15 0754  GLUCAP 258* 175* 208*     5.Toxic encephalopathy due to UTI upon admission. Has resolved. Is baseline. Continue antibiotics as in #1 above.    6.Dyslipidemia - on statin.    7.CAD - as in #1.    8. Dysphagia. GI following. Has long-standing history of dysphagia after an episode of esophageal rupture in the past. Was nothing by mouth for 2 days per speech therapy. On 03/25/2015  swallowing improved and now placed on regular diet per speech. Continue to monitor.     Code Status: Full  Family Communication: Wife, son  - clearly explained poor prognosis and critical state.  Disposition Plan: TBD   Procedures   TTE  - Left ventricle: The cavity size was normal. Wall thickness wasincreased in a pattern of mild LVH. Systolic function was normal.The estimated ejection fraction was in the range of 55% to 60%.There is hypokinesis of the basalinferolateral myocardium.Doppler  parameters are consistent with abnormal left ventricularrelaxation (grade 1 diastolic dysfunction). Doppler parametersare consistent with high ventricular filling pressure. - Mitral valve: Calcified annulus.  Impressions: Mild hypokinesis of the basal inferolateral wall with overallnormal LV function; grade 1 diastolic dysfunction with elevatedLV filling pressure; calcified aortic valve; mean gradient of 67mmHg suggests mild AS but visually valve opens well.   Consults  Cards, GI, PCCM   Medications  Scheduled Meds: . amitriptyline  50 mg Oral QHS  . amLODipine  5 mg Oral Daily  . clopidogrel  75 mg Oral Daily  . donepezil  10 mg Oral Daily  . insulin aspart  0-15 Units Subcutaneous TID WC  . insulin aspart  0-5 Units Subcutaneous QHS  . insulin aspart  4 Units Subcutaneous TID WC  . insulin glargine  20 Units Subcutaneous BID  . metoprolol succinate  25 mg Oral Daily  . omega-3 acid ethyl esters  1 g Oral BID  . [START ON 03/29/2015] pantoprazole (PROTONIX) IV  40 mg Intravenous Q12H  . piperacillin-tazobactam (ZOSYN)  IV  3.375 g Intravenous 3 times per day  . rosuvastatin  20 mg Oral Daily  . tamsulosin  0.4 mg Oral QHS   Continuous Infusions: . dextrose 75 mL/hr at 03/25/15 1900  . pantoprozole (PROTONIX) infusion 8 mg/hr (03/26/15 0600)   PRN Meds:.acetaminophen **OR** acetaminophen, albuterol, alum & mag hydroxide-simeth, feeding supplement (GLUCERNA SHAKE), ondansetron **OR** ondansetron (ZOFRAN) IV, oxyCODONE  DVT Prophylaxis  Heparin - SCDs   Lab Results  Component Value Date   PLT 127* 03/26/2015    Antibiotics     Anti-infectives    Start     Dose/Rate Route Frequency Ordered Stop   03/23/15 0845  vancomycin (VANCOCIN) 1,250 mg in sodium chloride 0.9 % 250 mL IVPB  Status:  Discontinued     1,250 mg 166.7 mL/hr over 90 Minutes Intravenous Every 24 hours 03/23/15 0830 03/25/15 0955   03/23/15 0845  piperacillin-tazobactam (ZOSYN) IVPB 3.375 g     3.375  g 12.5 mL/hr over 240 Minutes Intravenous 3 times per day 03/23/15 0830     03/22/15 1500  cefTRIAXone (ROCEPHIN) 1 g in dextrose 5 % 50 mL IVPB - Premix  Status:  Discontinued     1 g 100 mL/hr over 30 Minutes Intravenous Every 24 hours 03/21/15 1710 03/23/15 0752   03/21/15 1500  cefTRIAXone (ROCEPHIN) 1 g in dextrose 5 % 50 mL IVPB     1 g 100 mL/hr over 30 Minutes Intravenous  Once 03/21/15 1452 03/21/15 1540          Objective:   Filed Vitals:   03/25/15 2340 03/26/15 0420 03/26/15 0700 03/26/15 0800  BP: 143/55 136/58  126/47  Pulse: 54 60  55  Temp:  97.4 F (36.3 C) 98.2 F (36.8 C)   TempSrc:  Axillary Oral   Resp: 24 24  17   Height:      Weight:      SpO2: 100% 100%  98%    Wt  Readings from Last 3 Encounters:  03/25/15 115.2 kg (253 lb 15.5 oz)  12/11/14 117.935 kg (260 lb)  11/14/14 116.62 kg (257 lb 1.6 oz)     Intake/Output Summary (Last 24 hours) at 03/26/15 1034 Last data filed at 03/26/15 0600  Gross per 24 hour  Intake 1856.58 ml  Output   1176 ml  Net 680.58 ml     Physical Exam  Awake Alert, Oriented X 3, No new F.N deficits, Normal affect,  [place].AT,PERRAL Supple Neck,No JVD, No cervical lymphadenopathy appriciated.  Symmetrical Chest wall movement, Good air movement bilaterally, RML rales improved RRR,No Gallops,Rubs or new Murmurs, No Parasternal Heave +ve B.Sounds, Abd Soft, No tenderness, No organomegaly appriciated, No rebound - guarding or rigidity. No Cyanosis, Clubbing or edema, No new Rash or bruise     Data Review   Micro Results Recent Results (from the past 240 hour(s))  Urine culture     Status: None   Collection Time: 03/21/15  3:02 PM  Result Value Ref Range Status   Specimen Description URINE, CLEAN CATCH  Final   Special Requests NONE  Final   Colony Count   Final    >=100,000 COLONIES/ML Performed at [facility]    Culture   Final    ESCHERICHIA COLI Performed at [facility]    Report Status  03/23/2015 FINAL  Final   Organism ID, Bacteria ESCHERICHIA COLI  Final      Susceptibility   Escherichia coli - MIC*    AMPICILLIN 8 SENSITIVE Sensitive     CEFAZOLIN <=4 SENSITIVE Sensitive     CEFTRIAXONE <=1 SENSITIVE Sensitive     CIPROFLOXACIN >=4 RESISTANT Resistant     GENTAMICIN <=1 SENSITIVE Sensitive     LEVOFLOXACIN >=8 RESISTANT Resistant     NITROFURANTOIN 64 INTERMEDIATE Intermediate     TOBRAMYCIN <=1 SENSITIVE Sensitive     TRIMETH/SULFA <=20 SENSITIVE Sensitive     PIP/TAZO <=4 SENSITIVE Sensitive     * ESCHERICHIA COLI  Urine culture     Status: None   Collection Time: 03/21/15  6:00 PM  Result Value Ref Range Status   Specimen Description URINE, RANDOM  Final   Special Requests NONE  Final   Colony Count NO GROWTH Performed at [facility]   Final   Culture NO GROWTH Performed at [facility]   Final   Report Status 03/23/2015 FINAL  Final  Clostridium Difficile by PCR     Status: None   Collection Time: 03/21/15  9:33 PM  Result Value Ref Range Status   C difficile by pcr NEGATIVE NEGATIVE Final  MRSA PCR Screening     Status: None   Collection Time: 03/23/15  8:06 AM  Result Value Ref Range Status   MRSA by PCR NEGATIVE NEGATIVE Final    Comment:        The GeneXpert MRSA Assay (FDA approved for NASAL specimens only), is one component of a comprehensive MRSA colonization surveillance program. It is not intended to diagnose MRSA infection nor to guide or monitor treatment for MRSA infections.   Culture, blood (routine x 2)     Status: None (Preliminary result)   Collection Time: 03/23/15  8:25 AM  Result Value Ref Range Status   Specimen Description BLOOD RIGHT ARM  Final   Special Requests BOTTLES DRAWN AEROBIC AND ANAEROBIC 10CC  Final   Culture   Final           BLOOD CULTURE RECEIVED NO  GROWTH TO DATE CULTURE WILL BE HELD FOR 5 DAYS BEFORE ISSUING A FINAL NEGATIVE REPORT Performed at [facility]    Report Status  PENDING  Incomplete  Culture, blood (routine x 2)     Status: None (Preliminary result)   Collection Time: 03/23/15  8:32 AM  Result Value Ref Range Status   Specimen Description BLOOD RIGHT ARM  Final   Special Requests BOTTLES DRAWN AEROBIC AND ANAEROBIC [place]  Final   Culture   Final           BLOOD CULTURE RECEIVED NO GROWTH TO DATE CULTURE WILL BE HELD FOR 5 DAYS BEFORE ISSUING A FINAL NEGATIVE REPORT Performed at [facility]    Report Status PENDING  Incomplete  Culture, blood (routine x 2)     Status: None (Preliminary result)   Collection Time: 03/23/15 10:00 AM  Result Value Ref Range Status   Specimen Description BLOOD LEFT HAND  Final   Special Requests BOTTLES DRAWN AEROBIC ONLY 2CC  Final   Culture   Final           BLOOD CULTURE RECEIVED NO GROWTH TO DATE CULTURE WILL BE HELD FOR 5 DAYS BEFORE ISSUING A FINAL NEGATIVE REPORT Performed at [facility]    Report Status PENDING  Incomplete  Culture, blood (routine x 2)     Status: None (Preliminary result)   Collection Time: 03/23/15 10:15 AM  Result Value Ref Range Status   Specimen Description BLOOD RIGHT HAND  Final   Special Requests BOTTLES DRAWN AEROBIC ONLY 2CC  Final   Culture   Final           BLOOD CULTURE RECEIVED NO GROWTH TO DATE CULTURE WILL BE HELD FOR 5 DAYS BEFORE ISSUING A FINAL NEGATIVE REPORT Performed at [facility]    Report Status PENDING  Incomplete    Radiology Reports   X-ray Chest Pa And Lateral  03/22/2015   CLINICAL DATA:  Shortness of breath  EXAM: CHEST  2 VIEW  COMPARISON:  11/14/2009  FINDINGS: No cardiomegaly for technique. Aortic and hilar contours are negative. 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 No edema, consolidation, effusion, or pneumothorax.  IMPRESSION: No active cardiopulmonary disease.   Electronically Signed   By: Monte Fantasia M.D.   On: 03/22/2015 01:10   Dg Esophagus  02/27/2015   CLINICAL DATA:  Dysphagia   EXAM: ESOPHOGRAM / BARIUM SWALLOW / BARIUM TABLET STUDY  TECHNIQUE: Combined double contrast and single contrast examination performed using effervescent crystals, thick barium liquid, and thin barium liquid. The patient was observed with fluoroscopy swallowing a 46mm barium sulphate tablet.  FLUOROSCOPY TIME:  4 minutes 0 seconds  COMPARISON:  None  FINDINGS: No cervical esophageal narrowing. Cervical plate noted from anterior fusion at C3-4. Separate plate at J4-7.  Fluoroscopic evaluation of swallowing demonstrates disruption of 3 out of 4 primary esophageal peristaltic waves with moderate tertiary contractions. No fold thickening.  There is mild distal esophageal short segment stricture, best seen on the upright views and after administration of the barium tablet. The barium tablet sticks for several minutes in the distal esophagus. The tablet also initially sticks to the right of midline in the cervical esophagus, reproduce in the patient's symptoms. This is presumably within the right piriform sinus. The barium tablet finally passes into the esophagus but lodges distally in area of mild distal esophageal stricture.  IMPRESSION: The barium tablet initially lodges in the right piriform sinus,  reproducing the patient's symptoms. This finally passes into the esophagus, but lodges in the distal esophagus at an area of mild short segment distal esophageal stricture.  Esophageal dysmotility with disruption of primary peristaltic waves an moderate tertiary contractions.   Electronically Signed   By: Rolm Baptise M.D.   On: 02/27/2015 12:38   US Renal  03/24/2015   CLINICAL DATA:  74 year old male with acute renal failure.  EXAM: RENAL/URINARY TRACT ULTRASOUND COMPLETE  COMPARISON:  None.  FINDINGS: Right Kidney:  Length: 10.2 cm. A 3.8 x 3.9 cm lower pole cyst is present. Echogenicity within normal limits. No mass or hydronephrosis visualized.  Left Kidney:  Length: 11.5 cm. Echogenicity within normal limits. No  mass or hydronephrosis visualized.  Bladder:  Appears normal for degree of bladder distention.  IMPRESSION: Right renal cyst, otherwise unremarkable renal ultrasound. No evidence of hydronephrosis.   Electronically Signed   By: Margarette Canada M.D.   On: 03/24/2015 12:18   Dg Chest Port 1 View  03/24/2015   CLINICAL DATA:  Shortness of breath and respiratory failure.  EXAM: PORTABLE CHEST - 1 VIEW  COMPARISON:  03/23/2015 and prior radiographs  FINDINGS: The cardiomediastinal silhouette is unchanged.  Right lung airspace disease is again identified.  No pneumothorax or definite effusion noted.  Elevation of right hemidiaphragm again identified.  Prior cervical fusion changes noted.  IMPRESSION: Right lung airspace disease - not significantly changed.   Electronically Signed   By: Margarette Canada M.D.   On: 03/24/2015 09:57   Dg Chest Port 1 View  03/23/2015   CLINICAL DATA:  Shortness of breath.  EXAM: PORTABLE CHEST - 1 VIEW  COMPARISON:  03/21/2015  FINDINGS: Lungs are hypoinflated and demonstrate a new moderate airspace process over the central right lung. No evidence of effusion or pneumothorax. Cardiomediastinal silhouette and remainder of the exam is unchanged.  IMPRESSION: New moderate airspace process over the central right lung likely infection and less likely asymmetric edema.   Electronically Signed   By: Marin Olp M.D.   On: 03/23/2015 08:12     CBC  Recent Labs Lab 03/22/15 0420 03/23/15 1018 03/24/15 0445 03/25/15 0324 03/25/15 2004 03/25/15 2300 03/26/15 0501  WBC 16.7* 12.0* 10.0 8.4  --   --  6.9  HGB 11.6* 12.8* 12.4* 12.1* 15.8 10.8* 11.3*  HCT 35.1* 38.5* 37.6* 36.8* 50.6 33.4* 34.6*  PLT 109* 114* 123* 120*  --   --  127*  MCV 90.0 90.4 89.1 91.1  --   --  91.1  MCH 29.7 30.0 29.4 30.0  --   --  29.7  MCHC 33.0 33.2 33.0 32.9  --   --  32.7  RDW 15.1 15.1 15.2 15.3  --   --  15.4    Chemistries   Recent Labs Lab 03/21/15 1326 03/22/15 0420 03/23/15 1018  03/24/15 0445 03/25/15 0324 03/26/15 0501  NA 136 136 135 139 146* 142  K 3.9 4.0 3.9 4.0 3.8 3.6  CL 99 102 98 103 109 108  CO2 24 26 22 21  33* 25  GLUCOSE 299* 187* 205* 202* 112* 230*  BUN 27* 38* 49* 52* 50* 41*  CREATININE 2.06* 2.38* 2.44* 2.08* 1.83* 1.63*  CALCIUM 8.6 8.3* 8.3* 8.0* 8.5 8.5  AST 22  --  67*  --   --   --   ALT 14  --  22  --   --   --   ALKPHOS 69  --  64  --   --   --  BILITOT 1.5*  --  1.1  --   --   --    ------------------------------------------------------------------------------------------------------------------ estimated creatinine clearance is 49.8 mL/min (by C-G formula based on Cr of 1.63). ------------------------------------------------------------------------------------------------------------------ No results for input(s): HGBA1C in the last 72 hours. ------------------------------------------------------------------------------------------------------------------ No results for input(s): CHOL, HDL, LDLCALC, TRIG, CHOLHDL, LDLDIRECT in the last 72 hours. ------------------------------------------------------------------------------------------------------------------ No results for input(s): TSH, T4TOTAL, T3FREE, THYROIDAB in the last 72 hours.  Invalid input(s): FREET3 ------------------------------------------------------------------------------------------------------------------ No results for input(s): VITAMINB12, FOLATE, FERRITIN, TIBC, IRON, RETICCTPCT in the last 72 hours.  Coagulation profile  Recent Labs Lab 03/21/15 1326  INR 1.12    No results for input(s): DDIMER in the last 72 hours.  Cardiac Enzymes  Recent Labs Lab 03/23/15 1010 03/23/15 1628 03/23/15 2240  TROPONINI 9.21* 23.54* 26.86*   ------------------------------------------------------------------------------------------------------------------ Invalid input(s): POCBNP   Time Spent in minutes   35   SINGH,PRASHANT K M.D on 03/26/2015 at 10:34  AM  Between 7am to 7pm - Pager - [phone]  After 7pm go to [email or web address] - password [facility]  [facility]   Office  [phone]

## 2015-03-26 NOTE — Progress Notes (Signed)
Placed patient on BIPAP, previous settings 16/8 R-12, 30% FIO2 via FFM. Patient tolerating well at this time. RT will continue to monitor.

## 2015-03-26 NOTE — Progress Notes (Signed)
Rt note: Pt off bipap at 4am check.

## 2015-03-26 NOTE — Progress Notes (Signed)
Inpatient Diabetes Program Recommendations  AACE/ADA: New Consensus Statement on Inpatient Glycemic Control (2013)  Target Ranges:  Prepandial:   less than 140 mg/dL      Peak postprandial:   less than 180 mg/dL (1-2 hours)      Critically ill patients:  140 - 180 mg/dL    Results for [place], Kwame S (MRN [id number]) as of 03/26/2015 08:03  Ref. Range 03/25/2015 07:41 03/25/2015 11:45 03/25/2015 16:37 03/25/2015 19:42  Glucose-Capillary Latest Range: 70-99 mg/dL 103 (H) 260 (H) 258 (H) 175 (H)   Diabetes history: DM 2 Outpatient Diabetes medications: Lantus 60 units Daily, Humalog 25 units breakfast, 20 units lunch, 20 units supper, 25 units at bedtime. Current orders for Inpatient glycemic control: Lantus 20 units BID, Novolog 0-15 units TID, Novolog 4 units TID meal cov, Novolog 0-5 units QHS  Inpatient Diabetes Program Recommendations Insulin - Meal Coverage: At home, patient takes Humalog 25 units breakfast, 20 units lunch, 20 units supper, 25 units at bedtime. Patient's glucose increased to 260 and 258 mg/dl at meal times yesterday. Please increase Novolog meal coverage to 10 units TID with meals.  Thanks,  Tama Headings RN, MSN, [facility] Inpatient Diabetes Coordinator Team Pager [phone]

## 2015-03-26 NOTE — Progress Notes (Signed)
Randall Wilkins 12:54 PM  Subjective: Patient with no further obvious bleeding and stools are firmer and dark and no current complaints and we reviewed his dysphasia history and barium swallow with he and his wife and he has not had any endoscopy before but has had a colonoscopy in the past and his office computer chart and hospital computer chart was reviewed and his case was discussed with the hospital team yesterday Objective: Vital signs stable afebrile no acute distress lungs are clear heart decreased heart sounds abdomen is soft nontender labs reviewed BUN and creatinine decreased hemoglobin stable  Assessment: Improved dysphasia but now with GI bleeding secondary to blood thinners  Plan: The risks and benefits methods of endoscopy and possible balloon dilatation was discussed with the patient and his wife and will proceed tomorrow at noon based on the schedule with further workup and plans pending those findings  [facility] E

## 2015-03-26 NOTE — Progress Notes (Signed)
Patient Name: Randall Wilkins Date of Encounter: 03/26/2015     Principal Problem:   Sepsis Active Problems:   Memory impairment   Hyperlipidemia   Diabetes 1.5, managed as type 1   CVA (cerebral infarction)   CAD (coronary artery disease)   Bilateral leg edema   Obstructive sleep apnea   Acute renal failure   Mild diastolic dysfunction   Subendocardial MI subsequent episode care   Acute respiratory failure with hypoxia   NSTEMI (non-ST elevated myocardial infarction)    SUBJECTIVE  The patient has had no chest pain or shortness of breath.  Yesterday afternoon he had 2 large black stools which are positive for blood.  His IV heparin was stopped.  He remains on Plavix alone.  His renal function is improving but still creatinine 1.63.  CURRENT MEDS . amitriptyline  50 mg Oral QHS  . amLODipine  5 mg Oral Daily  . clopidogrel  75 mg Oral Daily  . donepezil  10 mg Oral Daily  . insulin aspart  0-15 Units Subcutaneous TID WC  . insulin aspart  0-5 Units Subcutaneous QHS  . insulin aspart  4 Units Subcutaneous TID WC  . insulin glargine  20 Units Subcutaneous BID  . metoprolol succinate  25 mg Oral Daily  . omega-3 acid ethyl esters  1 g Oral BID  . [START ON 03/29/2015] pantoprazole (PROTONIX) IV  40 mg Intravenous Q12H  . piperacillin-tazobactam (ZOSYN)  IV  3.375 g Intravenous 3 times per day  . rosuvastatin  20 mg Oral Daily  . tamsulosin  0.4 mg Oral QHS    OBJECTIVE  Filed Vitals:   03/25/15 2340 03/26/15 0420 03/26/15 0700 03/26/15 0800  BP: 143/55 136/58  126/47  Pulse: 54 60  55  Temp:  97.4 F (36.3 C) 98.2 F (36.8 C)   TempSrc:  Axillary Oral   Resp: 24 24  17   Height:      Weight:      SpO2: 100% 100%  98%    Intake/Output Summary (Last 24 hours) at 03/26/15 0816 Last data filed at 03/26/15 0600  Gross per 24 hour  Intake 1856.58 ml  Output   1176 ml  Net 680.58 ml   Filed Weights   03/21/15 2100 03/24/15 0500 03/25/15 0500  Weight: 258 lb  4.8 oz (117.164 kg) 256 lb 9.9 oz (116.4 kg) 253 lb 15.5 oz (115.2 kg)    PHYSICAL EXAM  General: Pleasant, NAD. Neuro: Alert and oriented X 3. Moves all extremities spontaneously. Psych: Normal affect. HEENT:  Normal  Neck: Supple without bruits or JVD. Lungs:  Resp regular and unlabored, CTA. Heart: RRR no s3, s4, and there is a grade 2/6 systolic murmur at the base. Abdomen: Soft, non-tender, non-distended, BS + x 4.  Extremities: No clubbing, cyanosis or edema. DP/PT/Radials 2+ and equal bilaterally.  Accessory Clinical Findings  CBC  Recent Labs  03/25/15 0324  03/25/15 2300 03/26/15 0501  WBC 8.4  --   --  6.9  HGB 12.1*  < > 10.8* 11.3*  HCT 36.8*  < > 33.4* 34.6*  MCV 91.1  --   --  91.1  PLT 120*  --   --  127*  < > = values in this interval not displayed. Basic Metabolic Panel  Recent Labs  03/25/15 0324 03/26/15 0501  NA 146* 142  K 3.8 3.6  CL 109 108  CO2 33* 25  GLUCOSE 112* 230*  BUN 50* 41*  CREATININE  1.83* 1.63*  CALCIUM 8.5 8.5   Liver Function Tests  Recent Labs  03/23/15 1018  AST 67*  ALT 22  ALKPHOS 64  BILITOT 1.1  PROT 5.9*  ALBUMIN 2.6*   No results for input(s): LIPASE, AMYLASE in the last 72 hours. Cardiac Enzymes  Recent Labs  03/23/15 1010 03/23/15 1628 03/23/15 2240  TROPONINI 9.21* 23.54* 26.86*   BNP Invalid input(s): POCBNP D-Dimer No results for input(s): DDIMER in the last 72 hours. Hemoglobin A1C No results for input(s): HGBA1C in the last 72 hours. Fasting Lipid Panel No results for input(s): CHOL, HDL, LDLCALC, TRIG, CHOLHDL, LDLDIRECT in the last 72 hours. Thyroid Function Tests No results for input(s): TSH, T4TOTAL, T3FREE, THYROIDAB in the last 72 hours.  Invalid input(s): FREET3  TELE  Normal sinus rhythm  ECG    Radiology/Studies  X-ray Chest Pa And Lateral  03/22/2015   CLINICAL DATA:  Shortness of breath  EXAM: CHEST  2 VIEW  COMPARISON:  11/14/2009  FINDINGS: No cardiomegaly for  technique. Aortic and hilar contours are negative. 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 No edema, consolidation, effusion, or pneumothorax.  IMPRESSION: No active cardiopulmonary disease.   Electronically Signed   By: Monte Fantasia M.D.   On: 03/22/2015 01:10   Dg Esophagus  02/27/2015   CLINICAL DATA:  Dysphagia  EXAM: ESOPHOGRAM / BARIUM SWALLOW / BARIUM TABLET STUDY  TECHNIQUE: Combined double contrast and single contrast examination performed using effervescent crystals, thick barium liquid, and thin barium liquid. The patient was observed with fluoroscopy swallowing a 27mm barium sulphate tablet.  FLUOROSCOPY TIME:  4 minutes 0 seconds  COMPARISON:  None  FINDINGS: No cervical esophageal narrowing. Cervical plate noted from anterior fusion at C3-4. Separate plate at O9-7.  Fluoroscopic evaluation of swallowing demonstrates disruption of 3 out of 4 primary esophageal peristaltic waves with moderate tertiary contractions. No fold thickening.  There is mild distal esophageal short segment stricture, best seen on the upright views and after administration of the barium tablet. The barium tablet sticks for several minutes in the distal esophagus. The tablet also initially sticks to the right of midline in the cervical esophagus, reproduce in the patient's symptoms. This is presumably within the right piriform sinus. The barium tablet finally passes into the esophagus but lodges distally in area of mild distal esophageal stricture.  IMPRESSION: The barium tablet initially lodges in the right piriform sinus, reproducing the patient's symptoms. This finally passes into the esophagus, but lodges in the distal esophagus at an area of mild short segment distal esophageal stricture.  Esophageal dysmotility with disruption of primary peristaltic waves an moderate tertiary contractions.   Electronically Signed   By: Rolm Baptise M.D.   On: 02/27/2015 12:38   US Renal  03/24/2015    CLINICAL DATA:  74 year old male with acute renal failure.  EXAM: RENAL/URINARY TRACT ULTRASOUND COMPLETE  COMPARISON:  None.  FINDINGS: Right Kidney:  Length: 10.2 cm. A 3.8 x 3.9 cm lower pole cyst is present. Echogenicity within normal limits. No mass or hydronephrosis visualized.  Left Kidney:  Length: 11.5 cm. Echogenicity within normal limits. No mass or hydronephrosis visualized.  Bladder:  Appears normal for degree of bladder distention.  IMPRESSION: Right renal cyst, otherwise unremarkable renal ultrasound. No evidence of hydronephrosis.   Electronically Signed   By: Margarette Canada M.D.   On: 03/24/2015 12:18   Dg Chest Port 1 View  03/24/2015   CLINICAL DATA:  Shortness of breath  and respiratory failure.  EXAM: PORTABLE CHEST - 1 VIEW  COMPARISON:  03/23/2015 and prior radiographs  FINDINGS: The cardiomediastinal silhouette is unchanged.  Right lung airspace disease is again identified.  No pneumothorax or definite effusion noted.  Elevation of right hemidiaphragm again identified.  Prior cervical fusion changes noted.  IMPRESSION: Right lung airspace disease - not significantly changed.   Electronically Signed   By: Margarette Canada M.D.   On: 03/24/2015 09:57   Dg Chest Port 1 View  03/23/2015   CLINICAL DATA:  Shortness of breath.  EXAM: PORTABLE CHEST - 1 VIEW  COMPARISON:  03/21/2015  FINDINGS: Lungs are hypoinflated and demonstrate a new moderate airspace process over the central right lung. No evidence of effusion or pneumothorax. Cardiomediastinal silhouette and remainder of the exam is unchanged.  IMPRESSION: New moderate airspace process over the central right lung likely infection and less likely asymmetric edema.   Electronically Signed   By: Marin Olp M.D.   On: 03/23/2015 08:12    ASSESSMENT AND PLAN  1. NSTEMI occurring in the setting of acute respiratory distress. Acute dyspnea responded to restarting diuretic.Distant history of diagonal stent 36 and RCA stent 2005. Troponin over 20   2. CAD. Distant history of diagonal stent 41 and RCA stent 2005. Distant history of diagonal stent 61 and RCA stent 2005.  3. Essential hypertension 4. Dyslipidemia 5. Diabetes with diabetic neuropathy 6. Memory disorder 7.  GI bleed with melena , workup in progress  Plan: We will not do heart catheterization today.  We will need to workup his GI tract further.  Continue Plavix alone for now.  Consider heart catheterization later in week if he is stable from his GI bleed Signed, Darlin Coco MD

## 2015-03-27 ENCOUNTER — Inpatient Hospital Stay (HOSPITAL_COMMUNITY): Payer: Medicare Other | Admitting: Certified Registered"

## 2015-03-27 ENCOUNTER — Encounter (HOSPITAL_COMMUNITY)
Admission: EM | Disposition: A | Discharge status: Home / Self Care | Payer: Self-pay | Source: Home / Self Care | Attending: Internal Medicine

## 2015-03-27 ENCOUNTER — Encounter (HOSPITAL_COMMUNITY): Payer: Self-pay

## 2015-03-27 DIAGNOSIS — A419 Sepsis, unspecified organism: Secondary | ICD-10-CM

## 2015-03-27 HISTORY — PX: ESOPHAGOGASTRODUODENOSCOPY: SHX5428

## 2015-03-27 LAB — GI PATHOGEN PANEL BY PCR, STOOL

## 2015-03-27 LAB — BASIC METABOLIC PANEL
Anion gap: 8 (ref 5–15)
BUN: 29 mg/dL — ABNORMAL HIGH (ref 6–23)
CO2: 23 mmol/L (ref 19–32)
Calcium: 8.2 mg/dL — ABNORMAL LOW (ref 8.4–10.5)
Chloride: 110 mmol/L (ref 96–112)
Creatinine, Ser: 1.47 mg/dL — ABNORMAL HIGH (ref 0.50–1.35)
GFR calc Af Amer: 52 mL/min — ABNORMAL LOW (ref >90)
GFR calc non Af Amer: 45 mL/min — ABNORMAL LOW (ref >90)
Glucose, Bld: 178 mg/dL — ABNORMAL HIGH (ref 70–99)
Potassium: 4.3 mmol/L (ref 3.5–5.1)
Sodium: 141 mmol/L (ref 135–145)

## 2015-03-27 LAB — CBC
HCT: 31.4 % — ABNORMAL LOW (ref 39.0–52.0)
Hemoglobin: 10.6 g/dL — ABNORMAL LOW (ref 13.0–17.0)
MCH: 30.3 pg (ref 26.0–34.0)
MCHC: 33.8 g/dL (ref 30.0–36.0)
MCV: 89.7 fL (ref 78.0–100.0)
Platelets: 252 10*3/uL (ref 150–400)
RBC: 3.5 MIL/uL — ABNORMAL LOW (ref 4.22–5.81)
RDW: 15.2 % (ref 11.5–15.5)
WBC: 6.9 10*3/uL (ref 4.0–10.5)

## 2015-03-27 LAB — GLUCOSE, CAPILLARY
Glucose-Capillary: 183 mg/dL — ABNORMAL HIGH (ref 70–99)
Glucose-Capillary: 182 mg/dL — ABNORMAL HIGH (ref 70–99)
Glucose-Capillary: 229 mg/dL — ABNORMAL HIGH (ref 70–99)
Glucose-Capillary: 179 mg/dL — ABNORMAL HIGH (ref 70–99)

## 2015-03-27 SURGERY — EGD (ESOPHAGOGASTRODUODENOSCOPY)
Anesthesia: Monitor Anesthesia Care

## 2015-03-27 MED ORDER — LIDOCAINE HCL (CARDIAC) 20 MG/ML IV SOLN
INTRAVENOUS | Status: DC | PRN
  Administered 2015-03-27: 40 mg via INTRAVENOUS

## 2015-03-27 MED ORDER — BUTAMBEN-TETRACAINE-BENZOCAINE 2-2-14 % EX AERO
INHALATION_SPRAY | CUTANEOUS | Status: DC | PRN
  Administered 2015-03-27: 2 via TOPICAL

## 2015-03-27 MED ORDER — LACTATED RINGERS IV SOLN
INTRAVENOUS | Status: DC | PRN
  Administered 2015-03-27: 11:00:00 via INTRAVENOUS

## 2015-03-27 MED ORDER — METOPROLOL SUCCINATE 12.5 MG HALF TABLET
12.5000 mg | ORAL_TABLET | Freq: Every day | ORAL | Status: DC
  Administered 2015-03-28: 12.5 mg via ORAL
  Filled 2015-03-27 (×2): qty 1

## 2015-03-27 MED ORDER — SODIUM CHLORIDE 0.9 % IV SOLN: INTRAVENOUS | Status: DC

## 2015-03-27 MED ORDER — PROPOFOL INFUSION 10 MG/ML OPTIME
INTRAVENOUS | Status: DC | PRN
  Administered 2015-03-27: 100 ug/kg/min via INTRAVENOUS

## 2015-03-27 NOTE — Anesthesia Preprocedure Evaluation (Signed)
Anesthesia Evaluation  Patient identified by MRN, date of birth, ID band Patient awake    Reviewed: Allergy & Precautions, NPO status , Patient's Chart, lab work & pertinent test results  Airway Mallampati: II  TM Distance: >3 FB Neck ROM: Full    Dental   Pulmonary shortness of breath, sleep apnea , former smoker,  breath sounds clear to auscultation        Cardiovascular hypertension, + CAD, + Past MI and + Peripheral Vascular Disease Rhythm:Regular Rate:Normal     Neuro/Psych  Neuromuscular disease CVA    GI/Hepatic History noted. CE   Endo/Other  diabetes  Renal/GU Renal disease     Musculoskeletal   Abdominal   Peds  Hematology   Anesthesia Other Findings   Reproductive/Obstetrics                             Anesthesia Physical Anesthesia Plan  ASA: IV  Anesthesia Plan: MAC   Post-op Pain Management:    Induction: Intravenous  Airway Management Planned: Simple Face Mask  Additional Equipment:   Intra-op Plan:   Post-operative Plan:   Informed Consent: I have reviewed the patients History and Physical, chart, labs and discussed the procedure including the risks, benefits and alternatives for the proposed anesthesia with the patient or authorized representative who has indicated his/her understanding and acceptance.   Dental advisory given  Plan Discussed with: CRNA and Anesthesiologist  Anesthesia Plan Comments:         Anesthesia Quick Evaluation

## 2015-03-27 NOTE — Progress Notes (Signed)
Medicare Important Message given? YES (If response is "NO", the following Medicare IM given date fields will be blank) Date Medicare IM given:03/27/2015 Medicare IM given by: Whitman Hero

## 2015-03-27 NOTE — Progress Notes (Signed)
Inpatient Diabetes Program Recommendations  AACE/ADA: New Consensus Statement on Inpatient Glycemic Control (2013)  Target Ranges:  Prepandial:   less than 140 mg/dL      Peak postprandial:   less than 180 mg/dL (1-2 hours)      Critically ill patients:  140 - 180 mg/dL   Results for Randall Wilkins, Randall Wilkins (MRN [id number]) as of 03/27/2015 11:42  Ref. Range 03/26/2015 07:54 03/26/2015 11:39 03/26/2015 17:01 03/26/2015 21:26 03/27/2015 08:07  Glucose-Capillary Latest Ref Range: 70-99 mg/dL 208 (H) 203 (H) 225 (H) 175 (H) 183 (H)   Diabetes history: DM2 Outpatient Diabetes medications: Lantus 60 units QAM, Humalog 25 units with breakfast, 20 units with lunch, 20 units with supper, and 25 units at bedtime Current orders for Inpatient glycemic control: Lantus 20 units BID, Novolog 0-15 units TID with meals, Novolog 0-5 units HS, Novolog 4 units TID with meals  Inpatient Diabetes Program Recommendations Insulin - Basal: Please consider increasing morning dose of Lantus to 25 units QAM and leave evening dose at Lantus 20 units QHS. Insulin - Meal Coverage: If patient is eating at least 50% of meals when diet resumed, please consider increasing meal coverage to Novolog 8 units TID with meals.   Thanks, Barnie Alderman, RN, MSN, CCRN, CDE Diabetes Coordinator Inpatient Diabetes Program [phone] (Team Pager from [place] to [place]) [phone] ([facility] office) [phone] [facility] office)

## 2015-03-27 NOTE — Progress Notes (Signed)
Physical Therapy Treatment Patient Details Name: Randall Wilkins MRN: [id number] DOB: Dec 14, 1941 Today's Date: 03/27/2015    History of Present Illness Patient is a 74 yo male admitted 03/21/15 with UTI, confusion, and recent falls.  PMH:  CVA, CAD, MI, DM, peripheral neuropathy, HTN, LLE weakness post rhabdo, OSA on CPAP. 4/9 respiratory issues with increaesed tropin but NO STEMI (per cardiology note feel increased tropin due to respiratory issues)    PT Comments    Patient progressing, has met initial LTG's, updated today.  Still limited with core strength, balance and endurance.  Patient for EGD today and cardiac cath later this week.    Follow Up Recommendations  Home health PT;Supervision/Assistance - 24 hour     Equipment Recommendations  None recommended by PT    Recommendations for Other Services       Precautions / Restrictions Precautions Precautions: Fall Precaution Comments: LLE weak and decreased sensation--from PMHx of rhabdomylosis Required Braces or Orthoses: Other Brace/Splint Other Brace/Splint: double metal upright for LLE (in closet)--he usually wears compression stockings with them which are also in closet    Mobility  Bed Mobility               General bed mobility comments: Patient up in chair  Transfers Overall transfer level: Needs assistance     Sit to Stand: Supervision         General transfer comment: for safety with lines/IV  Ambulation/Gait Ambulation/Gait assistance: Supervision Ambulation Distance (Feet): 400 Feet Assistive device: Rolling walker (2 wheeled) Gait Pattern/deviations: Step-through pattern;Trunk flexed;Decreased step length - left     General Gait Details: ambulated with shoes and AFO; cues for upright posture; decreased hip flexion and closer proximity to walker   Stairs            Wheelchair Mobility    Modified Rankin (Stroke Patients Only)       Balance           Standing balance support:  No upper extremity supported Standing balance-Leahy Scale: Poor Standing balance comment: stands with supervision and without walker close; unstable, so grabbed arm of chair while waiting for walker to be close enough                    Cognition Arousal/Alertness: Awake/alert Behavior During Therapy: WFL for tasks assessed/performed Overall Cognitive Status: Within Functional Limits for tasks assessed                      Exercises Other Exercises Other Exercises: seated trunk flex/ext sitting edge of chair arms over chest x 5 reps Other Exercises: standing hip extension x 10 with UE support Other Exercises: sit<>stand no UE support x 5    General Comments        Pertinent Vitals/Pain Pain Assessment: No/denies pain    Home Living                      Prior Function            PT Goals (current goals can now be found in the care plan section) Acute Rehab PT Goals Time For Goal Achievement: 04/05/15 Potential to Achieve Goals: Good Progress towards PT goals: Goals met and updated - see care plan    Frequency  Min 3X/week    PT Plan Current plan remains appropriate    Co-evaluation             End of Session Equipment  Utilized During Treatment: Gait belt Activity Tolerance: Patient tolerated treatment well Patient left: in chair;with call bell/phone within reach;with family/visitor present     Time: 0910-0945 PT Time Calculation (min) (ACUTE ONLY): 35 min  Charges:  $Gait Training: 8-22 mins $Therapeutic Exercise: 8-22 mins                    G Codes:      Dovie Kapusta,CYNDI Apr 06, 2015, 9:51 AM  Magda Kiel, PT [phone] 04-06-2015

## 2015-03-27 NOTE — Anesthesia Postprocedure Evaluation (Signed)
  Anesthesia Post-op Note  Patient: Randall Wilkins  Procedure(s) Performed: Procedure(s) with comments: ESOPHAGOGASTRODUODENOSCOPY (EGD) (N/A) - possible dilation  Patient Location: PACU  Anesthesia Type:MAC  Level of Consciousness: awake  Airway and Oxygen Therapy: Patient Spontanous Breathing  Post-op Pain: mild  Post-op Assessment: Post-op Vital signs reviewed  Post-op Vital Signs: Reviewed  Last Vitals:  Filed Vitals:   03/27/15 1246  BP: 158/57  Pulse: 50  Temp: 36.6 C  Resp: 22    Complications: No apparent anesthesia complications

## 2015-03-27 NOTE — Op Note (Signed)
[facility] [place], [id number]   ENDOSCOPY PROCEDURE REPORT  PATIENT: Randall Wilkins, Randall Wilkins  MR#: [id number] BIRTHDATE: 04/03/1941 , 74  yrs. old GENDER: male ENDOSCOPIST: Clarene Essex, MD REFERRED BY: PROCEDURE DATE:  04/03/2015 PROCEDURE:  EGD w/ biopsy ASA CLASS:     Class III INDICATIONS:  melena, dysphagia, and acute post hemorrhagic anemia.  MEDICATIONS: Propofol 200 mg IV and Lidocaine 40 mg IV TOPICAL ANESTHETIC: Cetacaine Spray  DESCRIPTION OF PROCEDURE: After the risks benefits and alternatives of the procedure were thoroughly explained, informed consent was obtained.  The Pentax Gastroscope [id number] endoscope was introduced through the mouth and advanced to the second portion of the duodenum , Without limitations.  The instrument was slowly withdrawn as the mucosa was fully examined.    The findings are recorded below       Retroflexed views revealed no abnormalities.     The scope was then withdrawn from the patient and the procedure completed.  COMPLICATIONS: There were no immediate complications.  ENDOSCOPIC IMPRESSION: 1. Normal vocal cords on quick evaluation 2. Tiny hiatal hernia with distal esophageal ulcers and narrowing with minimal trauma with passing the scope 3. Normal stomach status post antral and fundic biopsies to rule out H. pylori 4. Significant bulb ulcerations multiple without signs of bleeding 5. C-loop narrowing with second portion of the duodenum shallow ulcerations as well 6. Otherwise within normal limits EGD without active bleeding  RECOMMENDATIONS: twice a day pump inhibitors probably for 2 months then once a day and consider repeat endoscopy in 2-3 months to document healing   and will need Helicobacter treatment if positive and care with aspirin and nonsteroidals in the future but okay to proceed with catheterization but care with blood thinners post-cath  REPEAT EXAM: as needed or as  above  eSigned:  Clarene Essex, MD 04-03-2015 11:50 AM    CC:  CPT CODES: ICD CODES:  The ICD and CPT codes recommended by this software are interpretations from the data that the clinical staff has captured with the software.  The verification of the translation of this report to the ICD and CPT codes and modifiers is the sole responsibility of the health care institution and practicing physician where this report was generated.  [employer]. will not be held responsible for the validity of the ICD and CPT codes included on this report.  AMA assumes no liability for data contained or not contained herein. CPT is a Designer, television/film set of the [employer].  PATIENT NAME:  Randall Wilkins, Jariel Wilkins MR#: [id number]

## 2015-03-27 NOTE — Progress Notes (Signed)
Patient Demographics  Randall Wilkins, is a 74 y.o. male, DOB - [birth year], KYH:[id number]  Admit date - 03/21/2015   Admitting Physician Costin Karlyne Greenspan, MD  Outpatient Primary MD for the patient is Simona Huh, MD  LOS - 6   Chief Complaint  Patient presents with  . Urinary Frequency  . Altered Mental Status      Summary  74 y.o. male with a PMH of stroke, MI, CAD, DM, Rhabdomyolysis, dysphagia, short term memory loss and hypertension who was admitted for encephalopathy caused by UTI. He subsequently developed aspiration pneumonia, hypoxic respiratory failure, MI, upper GI bleed. He was transferred to stepdown unit. GI-cardiology are following the patient. He is due for EGD on 03/26/2015 followed by possible left heart cath later during this admission.   Subjective:   Randall Wilkins today has, No headache, No chest pain, No abdominal pain - No Nausea, No new weakness tingling or numbness, No Cough, much improved shortness of breath. 2 Episodes of loose black stools on 03/25/2015.  Assessment & Plan    1.Acute Hypoxic Resp Failure - due to NSTEMI + Aspiration HCAP - post BIPAP .   A. Cardiology consulted . Certainly had NSTEMI. Cardiology following, currently only on Plavix as aspirin and heparin drip had to be stopped due to an episode of upper GI bleed. Remained chest pain-free throughout. Will likely need left heart cath after cleared by GI from upper GI bleed standpoint. Likely MI brought in by severe hypoxia from pneumonia in the presence of underlying CAD.   B. Aspiration pneumonia. History of dysphagia, he remembers choking on food night of 03/22/2015, right middle lobe opacity, Leukocytosis, fever of 101 on 03/23/2015 with hypoxic respiratory failure. We will follow blood and sputum  cultures, was on IV antibiotics per HCAP protocol, will taper ABX as clinically much improved on 03/25/2015, now off BiPAP, on nasal cannula oxygen along with nebulizer treatments and supportive care as needed.     2. 2 episodes of black loose stools on 03/25/2015. H&H is only mildly dropped, was Hemoccult positive, aspirin and heparin had to be stopped. GI informed. Due for EGD on 03/27/2015. On IV PPI. Type screen done. For now H&H is stable. Once cleared by GI he might require left heart cath for NSTEMI workup.     3. Acute renal failure on chronic kidney disease stage III. Baseline creatinine close to 1.5. Ur Sodium was less than 10 on admission, likely dehydration, improved with IV fluids, switched to D5 as sodium slightly high.    4. DM type II. She on Lantus along with sliding scale, will add pre-meal NovoLog monitor sugars closely.  Lab Results  Component Value Date   HGBA1C 7.7* 03/21/2015    CBG (last 3)   Recent Labs  03/26/15 1701 03/26/15 2126 03/27/15 0807  GLUCAP 225* 175* 183*     5.Toxic encephalopathy due to UTI upon admission. Has resolved. Is baseline. Continue antibiotics as in #1 above.    6.Dyslipidemia - on statin.    7.CAD - as in #1.    8. Dysphagia. GI following. Has long-standing history of dysphagia after an episode of esophageal rupture in the past. Was nothing by mouth for 2 days per speech therapy. On 03/25/2015 swallowing  improved and now placed on regular diet per speech. Continue to monitor.     Code Status: Full  Family Communication: Wife, son  - clearly explained poor prognosis and critical state.  Disposition Plan: TBD   Procedures    EGD     TTE  - Left ventricle: The cavity size was normal. Wall thickness wasincreased in a pattern of mild LVH. Systolic function was normal.The estimated ejection fraction was in the range of 55% to 60%.There is hypokinesis of the basalinferolateral myocardium.Doppler parameters  are consistent with abnormal left ventricularrelaxation (grade 1 diastolic dysfunction). Doppler parametersare consistent with high ventricular filling pressure. - Mitral valve: Calcified annulus.  Impressions: Mild hypokinesis of the basal inferolateral wall with overallnormal LV function; grade 1 diastolic dysfunction with elevatedLV filling pressure; calcified aortic valve; mean gradient of 35mmHg suggests mild AS but visually valve opens well.   Consults  Cards, GI, PCCM   Medications  Scheduled Meds: . [MAR Hold] amitriptyline  50 mg Oral QHS  . [MAR Hold] amLODipine  5 mg Oral Daily  . [MAR Hold] clopidogrel  75 mg Oral Daily  . [MAR Hold] donepezil  10 mg Oral Daily  . [MAR Hold] insulin aspart  0-15 Units Subcutaneous TID WC  . [MAR Hold] insulin aspart  0-5 Units Subcutaneous QHS  . [MAR Hold] insulin aspart  4 Units Subcutaneous TID WC  . [MAR Hold] insulin glargine  20 Units Subcutaneous BID  . [MAR Hold] metoprolol succinate  25 mg Oral Daily  . [MAR Hold] omega-3 acid ethyl esters  1 g Oral BID  . [MAR Hold] pantoprazole (PROTONIX) IV  40 mg Intravenous Q12H  . [MAR Hold] piperacillin-tazobactam (ZOSYN)  IV  3.375 g Intravenous 3 times per day  . [MAR Hold] rosuvastatin  20 mg Oral Daily  . [MAR Hold] tamsulosin  0.4 mg Oral QHS   Continuous Infusions: . sodium chloride    . pantoprozole (PROTONIX) infusion 8 mg/hr (03/26/15 2225)   PRN Meds:.[MAR Hold] acetaminophen **OR** [MAR Hold] acetaminophen, [MAR Hold] albuterol, [MAR Hold] alum & mag hydroxide-simeth, [MAR Hold] feeding supplement (GLUCERNA SHAKE), [MAR Hold] ondansetron **OR** [MAR Hold] ondansetron (ZOFRAN) IV, [MAR Hold] oxyCODONE  DVT Prophylaxis  Heparin - SCDs   Lab Results  Component Value Date   PLT 252 03/27/2015    Antibiotics     Anti-infectives    Start     Dose/Rate Route Frequency Ordered Stop   03/23/15 0845  vancomycin (VANCOCIN) 1,250 mg in sodium chloride 0.9 % 250 mL IVPB   Status:  Discontinued     1,250 mg 166.7 mL/hr over 90 Minutes Intravenous Every 24 hours 03/23/15 0830 03/25/15 0955   03/23/15 0845  [MAR Hold]  piperacillin-tazobactam (ZOSYN) IVPB 3.375 g     (MAR Hold since 03/27/15 1021)   3.375 g 12.5 mL/hr over 240 Minutes Intravenous 3 times per day 03/23/15 0830     03/22/15 1500  cefTRIAXone (ROCEPHIN) 1 g in dextrose 5 % 50 mL IVPB - Premix  Status:  Discontinued     1 g 100 mL/hr over 30 Minutes Intravenous Every 24 hours 03/21/15 1710 03/23/15 0752   03/21/15 1500  cefTRIAXone (ROCEPHIN) 1 g in dextrose 5 % 50 mL IVPB     1 g 100 mL/hr over 30 Minutes Intravenous  Once 03/21/15 1452 03/21/15 1540          Objective:   Filed Vitals:   03/27/15 0327 03/27/15 0356 03/27/15 0500 03/27/15 0754  BP: 123/46  133/64  Pulse: 49 53 55 53  Temp: 97.5 F (36.4 C)   97.5 F (36.4 C)  TempSrc: Axillary   Oral  Resp: 23 26 27 20   Height:      Weight:   116.5 kg (256 lb 13.4 oz)   SpO2: 99% 100% 100% 100%    Wt Readings from Last 3 Encounters:  03/27/15 116.5 kg (256 lb 13.4 oz)  12/11/14 117.935 kg (260 lb)  11/14/14 116.62 kg (257 lb 1.6 oz)     Intake/Output Summary (Last 24 hours) at 03/27/15 1032 Last data filed at 03/27/15 4917  Gross per 24 hour  Intake 2498.33 ml  Output    800 ml  Net 1698.33 ml     Physical Exam  Awake Alert, Oriented X 3, No new F.N deficits, Normal affect,  [place].AT,PERRAL Supple Neck,No JVD, No cervical lymphadenopathy appriciated.  Symmetrical Chest wall movement, Good air movement bilaterally, RML rales improved RRR,No Gallops,Rubs or new Murmurs, No Parasternal Heave +ve B.Sounds, Abd Soft, No tenderness, No organomegaly appriciated, No rebound - guarding or rigidity. No Cyanosis, Clubbing or edema, No new Rash or bruise     Data Review   Micro Results Recent Results (from the past 240 hour(s))  Urine culture     Status: None   Collection Time: 03/21/15  3:02 PM  Result Value Ref Range  Status   Specimen Description URINE, CLEAN CATCH  Final   Special Requests NONE  Final   Colony Count   Final    >=100,000 COLONIES/ML Performed at [facility]    Culture   Final    ESCHERICHIA COLI Performed at [facility]    Report Status 03/23/2015 FINAL  Final   Organism ID, Bacteria ESCHERICHIA COLI  Final      Susceptibility   Escherichia coli - MIC*    AMPICILLIN 8 SENSITIVE Sensitive     CEFAZOLIN <=4 SENSITIVE Sensitive     CEFTRIAXONE <=1 SENSITIVE Sensitive     CIPROFLOXACIN >=4 RESISTANT Resistant     GENTAMICIN <=1 SENSITIVE Sensitive     LEVOFLOXACIN >=8 RESISTANT Resistant     NITROFURANTOIN 64 INTERMEDIATE Intermediate     TOBRAMYCIN <=1 SENSITIVE Sensitive     TRIMETH/SULFA <=20 SENSITIVE Sensitive     PIP/TAZO <=4 SENSITIVE Sensitive     * ESCHERICHIA COLI  Urine culture     Status: None   Collection Time: 03/21/15  6:00 PM  Result Value Ref Range Status   Specimen Description URINE, RANDOM  Final   Special Requests NONE  Final   Colony Count NO GROWTH Performed at [facility]   Final   Culture NO GROWTH Performed at [facility]   Final   Report Status 03/23/2015 FINAL  Final  Clostridium Difficile by PCR     Status: None   Collection Time: 03/21/15  9:33 PM  Result Value Ref Range Status   C difficile by pcr NEGATIVE NEGATIVE Final  MRSA PCR Screening     Status: None   Collection Time: 03/23/15  8:06 AM  Result Value Ref Range Status   MRSA by PCR NEGATIVE NEGATIVE Final    Comment:        The GeneXpert MRSA Assay (FDA approved for NASAL specimens only), is one component of a comprehensive MRSA colonization surveillance program. It is not intended to diagnose MRSA infection nor to guide or monitor treatment for MRSA infections.   Culture, blood (routine x 2)  Status: None (Preliminary result)   Collection Time: 03/23/15  8:25 AM  Result Value Ref Range Status   Specimen Description BLOOD RIGHT ARM   Final   Special Requests BOTTLES DRAWN AEROBIC AND ANAEROBIC 10CC  Final   Culture   Final           BLOOD CULTURE RECEIVED NO GROWTH TO DATE CULTURE WILL BE HELD FOR 5 DAYS BEFORE ISSUING A FINAL NEGATIVE REPORT Performed at [facility]    Report Status PENDING  Incomplete  Culture, blood (routine x 2)     Status: None (Preliminary result)   Collection Time: 03/23/15  8:32 AM  Result Value Ref Range Status   Specimen Description BLOOD RIGHT ARM  Final   Special Requests BOTTLES DRAWN AEROBIC AND ANAEROBIC [facility]  Final   Culture   Final           BLOOD CULTURE RECEIVED NO GROWTH TO DATE CULTURE WILL BE HELD FOR 5 DAYS BEFORE ISSUING A FINAL NEGATIVE REPORT Performed at [facility]    Report Status PENDING  Incomplete  Culture, blood (routine x 2)     Status: None (Preliminary result)   Collection Time: 03/23/15 10:00 AM  Result Value Ref Range Status   Specimen Description BLOOD LEFT HAND  Final   Special Requests BOTTLES DRAWN AEROBIC ONLY 2CC  Final   Culture   Final           BLOOD CULTURE RECEIVED NO GROWTH TO DATE CULTURE WILL BE HELD FOR 5 DAYS BEFORE ISSUING A FINAL NEGATIVE REPORT Performed at [facility]    Report Status PENDING  Incomplete  Culture, blood (routine x 2)     Status: None (Preliminary result)   Collection Time: 03/23/15 10:15 AM  Result Value Ref Range Status   Specimen Description BLOOD RIGHT HAND  Final   Special Requests BOTTLES DRAWN AEROBIC ONLY 2CC  Final   Culture   Final           BLOOD CULTURE RECEIVED NO GROWTH TO DATE CULTURE WILL BE HELD FOR 5 DAYS BEFORE ISSUING A FINAL NEGATIVE REPORT Performed at [facility]    Report Status PENDING  Incomplete    Radiology Reports   X-ray Chest Pa And Lateral  03/22/2015   CLINICAL DATA:  Shortness of breath  EXAM: CHEST  2 VIEW  COMPARISON:  11/14/2009  FINDINGS: No cardiomegaly for technique. Aortic and hilar contours are negative. 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 No edema, consolidation, effusion, or pneumothorax.  IMPRESSION: No active cardiopulmonary disease.   Electronically Signed   By: Monte Fantasia M.D.   On: 03/22/2015 01:10   Dg Esophagus  02/27/2015   CLINICAL DATA:  Dysphagia  EXAM: ESOPHOGRAM / BARIUM SWALLOW / BARIUM TABLET STUDY  TECHNIQUE: Combined double contrast and single contrast examination performed using effervescent crystals, thick barium liquid, and thin barium liquid. The patient was observed with fluoroscopy swallowing a 40mm barium sulphate tablet.  FLUOROSCOPY TIME:  4 minutes 0 seconds  COMPARISON:  None  FINDINGS: No cervical esophageal narrowing. Cervical plate noted from anterior fusion at C3-4. Separate plate at F6-4.  Fluoroscopic evaluation of swallowing demonstrates disruption of 3 out of 4 primary esophageal peristaltic waves with moderate tertiary contractions. No fold thickening.  There is mild distal esophageal short segment stricture, best seen on the upright views and after administration of the barium tablet. The barium tablet sticks for several minutes in the  distal esophagus. The tablet also initially sticks to the right of midline in the cervical esophagus, reproduce in the patient's symptoms. This is presumably within the right piriform sinus. The barium tablet finally passes into the esophagus but lodges distally in area of mild distal esophageal stricture.  IMPRESSION: The barium tablet initially lodges in the right piriform sinus, reproducing the patient's symptoms. This finally passes into the esophagus, but lodges in the distal esophagus at an area of mild short segment distal esophageal stricture.  Esophageal dysmotility with disruption of primary peristaltic waves an moderate tertiary contractions.   Electronically Signed   By: Rolm Baptise M.D.   On: 02/27/2015 12:38   US Renal  03/24/2015   CLINICAL DATA:  74 year old male with acute renal failure.  EXAM: RENAL/URINARY TRACT  ULTRASOUND COMPLETE  COMPARISON:  None.  FINDINGS: Right Kidney:  Length: 10.2 cm. A 3.8 x 3.9 cm lower pole cyst is present. Echogenicity within normal limits. No mass or hydronephrosis visualized.  Left Kidney:  Length: 11.5 cm. Echogenicity within normal limits. No mass or hydronephrosis visualized.  Bladder:  Appears normal for degree of bladder distention.  IMPRESSION: Right renal cyst, otherwise unremarkable renal ultrasound. No evidence of hydronephrosis.   Electronically Signed   By: Margarette Canada M.D.   On: 03/24/2015 12:18   Dg Chest Port 1 View  03/24/2015   CLINICAL DATA:  Shortness of breath and respiratory failure.  EXAM: PORTABLE CHEST - 1 VIEW  COMPARISON:  03/23/2015 and prior radiographs  FINDINGS: The cardiomediastinal silhouette is unchanged.  Right lung airspace disease is again identified.  No pneumothorax or definite effusion noted.  Elevation of right hemidiaphragm again identified.  Prior cervical fusion changes noted.  IMPRESSION: Right lung airspace disease - not significantly changed.   Electronically Signed   By: Margarette Canada M.D.   On: 03/24/2015 09:57   Dg Chest Port 1 View  03/23/2015   CLINICAL DATA:  Shortness of breath.  EXAM: PORTABLE CHEST - 1 VIEW  COMPARISON:  03/21/2015  FINDINGS: Lungs are hypoinflated and demonstrate a new moderate airspace process over the central right lung. No evidence of effusion or pneumothorax. Cardiomediastinal silhouette and remainder of the exam is unchanged.  IMPRESSION: New moderate airspace process over the central right lung likely infection and less likely asymmetric edema.   Electronically Signed   By: Marin Olp M.D.   On: 03/23/2015 08:12     CBC  Recent Labs Lab 03/23/15 1018 03/24/15 0445 03/25/15 0324 03/25/15 2004 03/25/15 2300 03/26/15 0501 03/27/15 0230  WBC 12.0* 10.0 8.4  --   --  6.9 6.9  HGB 12.8* 12.4* 12.1* 15.8 10.8* 11.3* 10.6*  HCT 38.5* 37.6* 36.8* 50.6 33.4* 34.6* 31.4*  PLT 114* 123* 120*  --   --   127* 252  MCV 90.4 89.1 91.1  --   --  91.1 89.7  MCH 30.0 29.4 30.0  --   --  29.7 30.3  MCHC 33.2 33.0 32.9  --   --  32.7 33.8  RDW 15.1 15.2 15.3  --   --  15.4 15.2    Chemistries   Recent Labs Lab 03/21/15 1326  03/23/15 1018 03/24/15 0445 03/25/15 0324 03/26/15 0501 03/27/15 0230  NA 136  < > 135 139 146* 142 141  K 3.9  < > 3.9 4.0 3.8 3.6 4.3  CL 99  < > 98 103 109 108 110  CO2 24  < > 22 21 33* 25 23  GLUCOSE 299*  < > 205* 202* 112* 230* 178*  BUN 27*  < > 49* 52* 50* 41* 29*  CREATININE 2.06*  < > 2.44* 2.08* 1.83* 1.63* 1.47*  CALCIUM 8.6  < > 8.3* 8.0* 8.5 8.5 8.2*  AST 22  --  67*  --   --   --   --   ALT 14  --  22  --   --   --   --   ALKPHOS 69  --  64  --   --   --   --   BILITOT 1.5*  --  1.1  --   --   --   --   < > = values in this interval not displayed. ------------------------------------------------------------------------------------------------------------------ estimated creatinine clearance is 55.5 mL/min (by C-G formula based on Cr of 1.47). ------------------------------------------------------------------------------------------------------------------ No results for input(s): HGBA1C in the last 72 hours. ------------------------------------------------------------------------------------------------------------------ No results for input(s): CHOL, HDL, LDLCALC, TRIG, CHOLHDL, LDLDIRECT in the last 72 hours. ------------------------------------------------------------------------------------------------------------------ No results for input(s): TSH, T4TOTAL, T3FREE, THYROIDAB in the last 72 hours.  Invalid input(s): FREET3 ------------------------------------------------------------------------------------------------------------------ No results for input(s): VITAMINB12, FOLATE, FERRITIN, TIBC, IRON, RETICCTPCT in the last 72 hours.  Coagulation profile  Recent Labs Lab 03/21/15 1326  INR 1.12    No results for input(s): DDIMER in  the last 72 hours.  Cardiac Enzymes  Recent Labs Lab 03/23/15 1010 03/23/15 1628 03/23/15 2240  TROPONINI 9.21* 23.54* 26.86*   ------------------------------------------------------------------------------------------------------------------ Invalid input(s): POCBNP   Time Spent in minutes   35   SINGH,PRASHANT K M.D on 03/27/2015 at 10:32 AM  Between 7am to 7pm - Pager - [phone]  After 7pm go to [email or web address] - password [facility]  [facility]   Office  [phone]

## 2015-03-27 NOTE — Progress Notes (Signed)
Randall Wilkins 10:58 AM  Subjective: Patient still with black formed stools but feeling fine without complaints  Objective: Vital signs stable afebrile no acute distress exam please see preassessment evaluation hemoglobin fairly stable BUN and creatinine improved  Assessment: GI bleeding secondary to blood thinners dysphasia seemingly resolved  Plan: Okay to proceed with endoscopy this morning with anesthesia assistance  [facility]

## 2015-03-27 NOTE — Transfer of Care (Signed)
Immediate Anesthesia Transfer of Care Note  Patient: Randall Wilkins  Procedure(s) Performed: Procedure(s) with comments: ESOPHAGOGASTRODUODENOSCOPY (EGD) (N/A) - possible dilation  Patient Location: PACU and Endoscopy Unit  Anesthesia Type:MAC  Level of Consciousness: awake, alert  and oriented  Airway & Oxygen Therapy: Patient Spontanous Breathing and Patient connected to nasal cannula oxygen  Post-op Assessment: Report given to RN, Post -op Vital signs reviewed and stable and Patient moving all extremities X 4  Post vital signs: Reviewed and stable  Last Vitals:  Filed Vitals:   03/27/15 1039  BP: 174/53  Pulse: 58  Temp: 36.9 C  Resp: 25    Complications: No apparent anesthesia complications

## 2015-03-27 NOTE — Progress Notes (Signed)
OT Cancellation Note  Patient Details Name: Randall Wilkins MRN: [id number] DOB: June 07, 1941   Cancelled Treatment:    Reason Eval/Treat Not Completed: Patient at procedure or test/ unavailable. Pt at endo for procedure. Acute OT will follow up as available to address POC.   Villa Herb M 03/27/2015, 10:32 AM

## 2015-03-27 NOTE — Progress Notes (Addendum)
Patient: Randall Wilkins / Admit Date: 03/21/2015 / Date of Encounter: 03/27/2015, 12:55 PM   Subjective: Feeling well s/p endoscopy. No CP or SOB.   Objective: Telemetry: NSR with occasional PACs/sinus arrhythmia, occasional sinus bradycardia into the 40s without significant pauses Physical Exam: Blood pressure 158/57, pulse 50, temperature 97.8 F (36.6 C), temperature source Oral, resp. rate 22, height 5\' 9"  (1.753 m), weight 256 lb 13.4 oz (116.5 kg), SpO2 100 %. General: Well developed, well nourished WM, in no acute distress. Head: Normocephalic, atraumatic, sclera non-icteric, no xanthomas, nares are without discharge. Neck:  JVP not elevated. Lungs: Clear bilaterally to auscultation without wheezes, rales, or rhonchi. Breathing is unlabored. Heart: RRR S1 S2. 2/6 SEM at the RUSB, preserved S2.  Abdomen: Soft, non-tender, non-distended with normoactive bowel sounds. No rebound/guarding. Extremities: No clubbing or cyanosis. No edema. Distal pedal pulses are 2+ and equal bilaterally. Neuro: Alert and oriented X 3. Moves all extremities spontaneously. Psych:  Responds to questions appropriately with a normal affect.   Intake/Output Summary (Last 24 hours) at 03/27/15 1255 Last data filed at 03/27/15 1148  Gross per 24 hour  Intake 2698.33 ml  Output    600 ml  Net 2098.33 ml    Inpatient Medications:  . [MAR Hold] amitriptyline  50 mg Oral QHS  . [MAR Hold] amLODipine  5 mg Oral Daily  . [MAR Hold] clopidogrel  75 mg Oral Daily  . [MAR Hold] donepezil  10 mg Oral Daily  . [MAR Hold] insulin aspart  0-15 Units Subcutaneous TID WC  . [MAR Hold] insulin aspart  0-5 Units Subcutaneous QHS  . [MAR Hold] insulin aspart  4 Units Subcutaneous TID WC  . [MAR Hold] insulin glargine  20 Units Subcutaneous BID  . [MAR Hold] metoprolol succinate  25 mg Oral Daily  . [MAR Hold] omega-3 acid ethyl esters  1 g Oral BID  . [MAR Hold] pantoprazole (PROTONIX) IV  40 mg Intravenous Q12H  .  [MAR Hold] piperacillin-tazobactam (ZOSYN)  IV  3.375 g Intravenous 3 times per day  . [MAR Hold] rosuvastatin  20 mg Oral Daily  . [MAR Hold] tamsulosin  0.4 mg Oral QHS   Infusions:  . pantoprozole (PROTONIX) infusion 8 mg/hr (03/26/15 2225)    Labs:  Recent Labs  03/26/15 0501 03/27/15 0230  NA 142 141  K 3.6 4.3  CL 108 110  CO2 25 23  GLUCOSE 230* 178*  BUN 41* 29*  CREATININE 1.63* 1.47*  CALCIUM 8.5 8.2*   No results for input(s): AST, ALT, ALKPHOS, BILITOT, PROT, ALBUMIN in the last 72 hours.  Recent Labs  03/26/15 0501 03/27/15 0230  WBC 6.9 6.9  HGB 11.3* 10.6*  HCT 34.6* 31.4*  MCV 91.1 89.7  PLT 127* 252    Radiology/Studies:  X-ray Chest Pa And Lateral  03/22/2015   CLINICAL DATA:  Shortness of breath  EXAM: CHEST  2 VIEW  COMPARISON:  11/14/2009  FINDINGS: No cardiomegaly for technique. Aortic and hilar contours are negative. 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 No edema, consolidation, effusion, or pneumothorax.  IMPRESSION: No active cardiopulmonary disease.   Electronically Signed   By: Monte Fantasia M.D.   On: 03/22/2015 01:10   Dg Esophagus  02/27/2015   CLINICAL DATA:  Dysphagia  EXAM: ESOPHOGRAM / BARIUM SWALLOW / BARIUM TABLET STUDY  TECHNIQUE: Combined double contrast and single contrast examination performed using effervescent crystals, thick barium liquid, and thin barium liquid. The patient was observed  with fluoroscopy swallowing a 7mm barium sulphate tablet.  FLUOROSCOPY TIME:  4 minutes 0 seconds  COMPARISON:  None  FINDINGS: No cervical esophageal narrowing. Cervical plate noted from anterior fusion at C3-4. Separate plate at A5-4.  Fluoroscopic evaluation of swallowing demonstrates disruption of 3 out of 4 primary esophageal peristaltic waves with moderate tertiary contractions. No fold thickening.  There is mild distal esophageal short segment stricture, best seen on the upright views and after administration of  the barium tablet. The barium tablet sticks for several minutes in the distal esophagus. The tablet also initially sticks to the right of midline in the cervical esophagus, reproduce in the patient's symptoms. This is presumably within the right piriform sinus. The barium tablet finally passes into the esophagus but lodges distally in area of mild distal esophageal stricture.  IMPRESSION: The barium tablet initially lodges in the right piriform sinus, reproducing the patient's symptoms. This finally passes into the esophagus, but lodges in the distal esophagus at an area of mild short segment distal esophageal stricture.  Esophageal dysmotility with disruption of primary peristaltic waves an moderate tertiary contractions.   Electronically Signed   By: Rolm Baptise M.D.   On: 02/27/2015 12:38     Assessment and Plan  40M with CAD (s/p remote stenting 1997, 2005), HTN, dyslipidemia, memory disorder, DM, neuropathy admitted with confusion, weakness, UTI, AKI on CKD, and melena. He developed severe respiratory distress (felt due to aspiration PNA and NSTEMI) with troponin >20.  1. NSTEMI - 2D echo 03/22/15 - mild HK of basal inferolateral wall with EF 55-60%, grade 1 DD, calcified aortic valve with gradient suggesting mild AS but visually valve opens well - he remains on Plavix alone for now - will discuss timing of cath with MD. Cannot consent patient now due to recent sedation and schedule is also quite full per lab staff. May be able to keep NPO after midnight to proceed tomorrow if BMET/CBC stable - given sinus bradycardia will decrease metoprolol to 12.5mg  daily starting tomorrow (already got his dose for today)  2. Melena with mild anemia - endoscopy today showed distal esophageal ulcers, significant bulb ulcerations without signs of bleeding, shallow ulcerations in duodenum, otherwise normal without active bleeding - H Pylori bx pending - based on endoscopy result Dr. Watt Climes feels OK to proceed with  catheterization but care with blood thinners post-cath and BID PPI on board  2. AKI on CKD stage III - Cr continues to improve - avoid nephrotoxins for now - would avoid LV gram during cath given renal insufficiency and known EF - patient actually has upcoming f/u later this month with renal as outpatient  Signed, Dayna Dunn PA-C  Agree with above assessment. Patient is now fully alert after his endoscopy. Discussed left heart cath with patient and wife. He is familiar with cath.  Multiple PCIs in past. The risks and benefits of a cardiac catheterization including, but not limited to, death, stroke, MI, kidney damage and bleeding were discussed with the patient who indicates understanding and agrees to proceed.  We will plan on left heart cath  tomorrow.  Right now posted for 1:30 pm with Dr. Ellyn Hack. Orders entered. Agree no LV gram to limit dye load.

## 2015-03-28 ENCOUNTER — Encounter (HOSPITAL_COMMUNITY)
Admission: EM | Disposition: A | Discharge status: Home / Self Care | Payer: Self-pay | Source: Home / Self Care | Attending: Internal Medicine

## 2015-03-28 ENCOUNTER — Inpatient Hospital Stay (HOSPITAL_COMMUNITY): Payer: Medicare Other

## 2015-03-28 ENCOUNTER — Encounter (HOSPITAL_COMMUNITY): Payer: Self-pay | Admitting: Gastroenterology

## 2015-03-28 DIAGNOSIS — D649 Anemia, unspecified: Secondary | ICD-10-CM

## 2015-03-28 DIAGNOSIS — N183 Chronic kidney disease, stage 3 unspecified: Secondary | ICD-10-CM | POA: Diagnosis present

## 2015-03-28 DIAGNOSIS — K921 Melena: Secondary | ICD-10-CM

## 2015-03-28 DIAGNOSIS — R1310 Dysphagia, unspecified: Secondary | ICD-10-CM

## 2015-03-28 DIAGNOSIS — K274 Chronic or unspecified peptic ulcer, site unspecified, with hemorrhage: Secondary | ICD-10-CM

## 2015-03-28 DIAGNOSIS — I5032 Chronic diastolic (congestive) heart failure: Secondary | ICD-10-CM | POA: Diagnosis present

## 2015-03-28 DIAGNOSIS — I2510 Atherosclerotic heart disease of native coronary artery without angina pectoris: Secondary | ICD-10-CM

## 2015-03-28 HISTORY — DX: Chronic or unspecified peptic ulcer, site unspecified, with hemorrhage: K27.4

## 2015-03-28 HISTORY — PX: LEFT HEART CATHETERIZATION WITH CORONARY ANGIOGRAM: SHX5451

## 2015-03-28 HISTORY — DX: Atherosclerotic heart disease of native coronary artery without angina pectoris: I25.10

## 2015-03-28 LAB — CBC
HCT: 30.3 % — ABNORMAL LOW (ref 39.0–52.0)
Hemoglobin: 10.2 g/dL — ABNORMAL LOW (ref 13.0–17.0)
MCH: 30.5 pg (ref 26.0–34.0)
MCHC: 33.7 g/dL (ref 30.0–36.0)
MCV: 90.7 fL (ref 78.0–100.0)
Platelets: 159 10*3/uL (ref 150–400)
RBC: 3.34 MIL/uL — ABNORMAL LOW (ref 4.22–5.81)
RDW: 15.1 % (ref 11.5–15.5)
WBC: 7 10*3/uL (ref 4.0–10.5)

## 2015-03-28 LAB — BASIC METABOLIC PANEL
Anion gap: 11 (ref 5–15)
BUN: 21 mg/dL (ref 6–23)
CO2: 24 mmol/L (ref 19–32)
Calcium: 8.5 mg/dL (ref 8.4–10.5)
Chloride: 107 mmol/L (ref 96–112)
Creatinine, Ser: 1.36 mg/dL — ABNORMAL HIGH (ref 0.50–1.35)
GFR calc Af Amer: 58 mL/min — ABNORMAL LOW (ref >90)
GFR calc non Af Amer: 50 mL/min — ABNORMAL LOW (ref >90)
Glucose, Bld: 146 mg/dL — ABNORMAL HIGH (ref 70–99)
Potassium: 3.5 mmol/L (ref 3.5–5.1)
Sodium: 142 mmol/L (ref 135–145)

## 2015-03-28 LAB — GLUCOSE, CAPILLARY
Glucose-Capillary: 126 mg/dL — ABNORMAL HIGH (ref 70–99)
Glucose-Capillary: 162 mg/dL — ABNORMAL HIGH (ref 70–99)
Glucose-Capillary: 117 mg/dL — ABNORMAL HIGH (ref 70–99)
Glucose-Capillary: 108 mg/dL — ABNORMAL HIGH (ref 70–99)

## 2015-03-28 LAB — PLATELET INHIBITION P2Y12: Platelet Function  P2Y12: 179 [PRU] — ABNORMAL LOW (ref 194–418)

## 2015-03-28 SURGERY — LEFT HEART CATHETERIZATION WITH CORONARY ANGIOGRAM

## 2015-03-28 MED ORDER — DEXTROSE 5 % IV SOLN
INTRAVENOUS | Status: AC
  Administered 2015-03-28: 10:00:00 via INTRAVENOUS

## 2015-03-28 MED ORDER — SODIUM CHLORIDE 0.9 % IJ SOLN: 3.0000 mL | INTRAMUSCULAR | Status: DC | PRN

## 2015-03-28 MED ORDER — LIDOCAINE HCL (PF) 1 % IJ SOLN
INTRAMUSCULAR | Status: AC
  Filled 2015-03-28: qty 30

## 2015-03-28 MED ORDER — SODIUM CHLORIDE 0.9 % IV SOLN
1.0000 mL/kg/h | INTRAVENOUS | Status: DC
  Administered 2015-03-28: 1 mL/kg/h via INTRAVENOUS

## 2015-03-28 MED ORDER — MIDAZOLAM HCL 2 MG/2ML IJ SOLN
INTRAMUSCULAR | Status: AC
  Filled 2015-03-28: qty 2

## 2015-03-28 MED ORDER — ASPIRIN 81 MG PO CHEW: 81.0000 mg | CHEWABLE_TABLET | ORAL | Status: DC

## 2015-03-28 MED ORDER — FENTANYL CITRATE 0.05 MG/ML IJ SOLN
INTRAMUSCULAR | Status: AC
Start: 2015-03-28 — End: 2015-03-28
  Filled 2015-03-28: qty 2

## 2015-03-28 MED ORDER — CHLORHEXIDINE GLUCONATE CLOTH 2 % EX PADS: 6.0000 | MEDICATED_PAD | Freq: Once | CUTANEOUS | Status: DC

## 2015-03-28 MED ORDER — TEMAZEPAM 15 MG PO CAPS: 15.0000 mg | ORAL_CAPSULE | Freq: Once | ORAL | Status: AC | PRN

## 2015-03-28 MED ORDER — HEPARIN (PORCINE) IN NACL 2-0.9 UNIT/ML-% IJ SOLN
INTRAMUSCULAR | Status: AC
  Filled 2015-03-28: qty 1000

## 2015-03-28 MED ORDER — VERAPAMIL HCL 2.5 MG/ML IV SOLN
INTRAVENOUS | Status: AC
  Filled 2015-03-28: qty 2

## 2015-03-28 MED ORDER — FUROSEMIDE 10 MG/ML IJ SOLN
INTRAMUSCULAR | Status: AC
  Filled 2015-03-28: qty 4

## 2015-03-28 MED ORDER — CHLORHEXIDINE GLUCONATE CLOTH 2 % EX PADS
6.0000 | MEDICATED_PAD | Freq: Once | CUTANEOUS | Status: AC
  Administered 2015-03-28: 6 via TOPICAL

## 2015-03-28 MED ORDER — SODIUM CHLORIDE 0.9 % IV SOLN: 250.0000 mL | INTRAVENOUS | Status: DC | PRN

## 2015-03-28 MED ORDER — SODIUM CHLORIDE 0.9 % IV SOLN: INTRAVENOUS | Status: DC

## 2015-03-28 MED ORDER — METOPROLOL TARTRATE 12.5 MG HALF TABLET
12.5000 mg | ORAL_TABLET | Freq: Once | ORAL | Status: AC
  Administered 2015-03-29: 12.5 mg via ORAL
  Filled 2015-03-28: qty 1

## 2015-03-28 MED ORDER — HEPARIN SODIUM (PORCINE) 1000 UNIT/ML IJ SOLN
INTRAMUSCULAR | Status: AC
  Filled 2015-03-28: qty 1

## 2015-03-28 MED ORDER — NITROGLYCERIN IN D5W 200-5 MCG/ML-% IV SOLN
0.0000 ug/min | INTRAVENOUS | Status: DC
  Administered 2015-03-28: 3 ug/min via INTRAVENOUS

## 2015-03-28 MED ORDER — FUROSEMIDE 10 MG/ML IJ SOLN
20.0000 mg | Freq: Once | INTRAMUSCULAR | Status: AC
  Administered 2015-03-28: 20 mg via INTRAVENOUS
  Filled 2015-03-28: qty 2

## 2015-03-28 MED ORDER — SODIUM CHLORIDE 0.9 % IJ SOLN
3.0000 mL | Freq: Two times a day (BID) | INTRAMUSCULAR | Status: DC
  Administered 2015-03-28: 3 mL via INTRAVENOUS

## 2015-03-28 MED ORDER — BISACODYL 5 MG PO TBEC: 5.0000 mg | DELAYED_RELEASE_TABLET | Freq: Once | ORAL | Status: DC

## 2015-03-28 MED ORDER — HEPARIN (PORCINE) IN NACL 100-0.45 UNIT/ML-% IJ SOLN
1650.0000 [IU]/h | INTRAMUSCULAR | Status: DC
  Administered 2015-03-28: 1650 [IU]/h via INTRAVENOUS
  Filled 2015-03-28 (×2): qty 250

## 2015-03-28 NOTE — Progress Notes (Addendum)
Randall Wilkins 4:57 PM  Subjective: Patient doing well from a GI standpoint and no signs of bleeding and the results of the cath were noted and they're about to restart heparin and his case was discussed with his wife as well  Objective: Vital signs stable afebrile no acute distress abdomen is soft nontender hemoglobin fairly stable BUN decreased  Assessment: Significant ulcers and coronary artery disease  Plan: Twice a day pump inhibitors observe closely for signs of bleeding on heparin please let us know if we can help and happy to see back when necessary and if doing well from open heart surgery consider repeat endoscopy down the road to document healing and await biopsy to rule out H. pylori but if positive would treat at some point in the future when recovered from surgery  [facility]

## 2015-03-28 NOTE — Progress Notes (Signed)
NUTRITION FOLLOW UP  Intervention:   Encourage PO D/c Glucerna supplements PRN  Nutrition Dx:   Inadequate oral intake related to poor appetite as evidenced by poor po. Resolved No nutrition diagnosis at this time  Goal:   Pt to meet >/= 90% of their estimated nutrition needs   Monitor:   Diet advancement, weight trends, po intake, labs  Assessment:   74 y.o. male with a PMH of stroke, MI, CAD, DM, Rhabdomyolysis, short term memory loss and hypertension.   Pt is feeling much better today, stating he is hungry at time of visit. He has been on clear liquid diet this morning in anticipation of cath lab (ate everything except broth which was too salty). He was on [place] yesterday, ate 50% of lunch (did not like all that was on the tray), and 100% of his dinner. Pt denies any need of supplements once diet advances, as he fells he can meet his needs with food alone. He expressed desire to loose weight (been trying for a while now), but was explained that getting better is the priority right now, which includes good PO.  Labs reviewed: Cr 1.36, Glu 146    Height: Ht Readings from Last 1 Encounters:  03/21/15 5\' 9"  (1.753 m)    Weight Status:   Wt Readings from Last 1 Encounters:  03/28/15 259 lb 14.8 oz (117.9 kg)    Re-estimated needs:  Kcal: 2100-2300 Protein: 140-150 g Fluid: 2.0 L/day  Skin: WDL  Diet Order: Diet NPO time specified Except for: Sips with Meds   Intake/Output Summary (Last 24 hours) at 03/28/15 1124 Last data filed at 03/28/15 0509  Gross per 24 hour  Intake   1790 ml  Output   1000 ml  Net    790 ml    Last BM: 4/13   Labs:   Recent Labs Lab 03/26/15 0501 03/27/15 0230 03/28/15 0325  NA 142 141 142  K 3.6 4.3 3.5  CL 108 110 107  CO2 25 23 24   BUN 41* 29* 21  CREATININE 1.63* 1.47* 1.36*  CALCIUM 8.5 8.2* 8.5  GLUCOSE 230* 178* 146*    CBG (last 3)   Recent Labs  03/27/15 1717 03/27/15 2110 03/28/15 0809  GLUCAP 229* 179*  126*    Scheduled Meds: . amitriptyline  50 mg Oral QHS  . amLODipine  5 mg Oral Daily  . [START ON 03/29/2015] aspirin  81 mg Oral Pre-Cath  . clopidogrel  75 mg Oral Daily  . donepezil  10 mg Oral Daily  . insulin aspart  0-15 Units Subcutaneous TID WC  . insulin aspart  0-5 Units Subcutaneous QHS  . insulin aspart  4 Units Subcutaneous TID WC  . insulin glargine  20 Units Subcutaneous BID  . metoprolol succinate  12.5 mg Oral Daily  . omega-3 acid ethyl esters  1 g Oral BID  . [START ON 03/29/2015] pantoprazole (PROTONIX) IV  40 mg Intravenous Q12H  . piperacillin-tazobactam (ZOSYN)  IV  3.375 g Intravenous 3 times per day  . rosuvastatin  20 mg Oral Daily  . sodium chloride  3 mL Intravenous Q12H  . tamsulosin  0.4 mg Oral QHS    Continuous Infusions: . [START ON 03/29/2015] sodium chloride    . dextrose 50 mL/hr at 03/28/15 1007  . pantoprozole (PROTONIX) infusion 8 mg/hr (03/28/15 0949)    Nusaybah Ivie A. Clinton Dietetic Intern Pager: [phone] 03/28/2015 11:37 AM

## 2015-03-28 NOTE — Progress Notes (Signed)
Patient Demographics  Randall Wilkins, is a 74 y.o. male, DOB - 11/25/1941, QZE:[id number]  Admit date - 03/21/2015   Admitting Physician Costin Karlyne Greenspan, MD  Outpatient Primary MD for the patient is Simona Huh, MD  LOS - 7   Chief Complaint  Patient presents with  . Urinary Frequency  . Altered Mental Status      Summary  74 y.o. male with a PMH of stroke, MI, CAD, DM, Rhabdomyolysis, dysphagia, short term memory loss and hypertension who was admitted for encephalopathy caused by UTI. He subsequently developed aspiration pneumonia, hypoxic respiratory failure, MI, upper GI bleed. He was transferred to stepdown unit. GI-cardiology are following the patient. He is due for EGD on 03/26/2015 followed by possible left heart cath later during this admission.   Subjective:   Randall Wilkins today has, No headache, No chest pain, No abdominal pain - No Nausea, No new weakness tingling or numbness, No Cough, much improved shortness of breath. 2 Episodes of loose black stools on 03/25/2015.  Assessment & Plan    1.Acute Hypoxic Resp Failure - due to NSTEMI + Aspiration HCAP - post BIPAP .   A. Cardiology consulted . Certainly had NSTEMI. Cardiology following, currently only on beta blocker, statin and Plavix as aspirin and heparin drip had to be stopped due to an episode of upper GI bleed on 03/25/2015. Remained chest pain-free throughout. Likely MI brought in by severe hypoxia from pneumonia in the presence of underlying CAD.  Cardiology is following throughout he is now due for left heart cath on 03/28/2015 afternoon. Will likely be a good candidate for bare metal stent if needed as upper GI bleed is recent and can recur. We'll defer further management to cardiology.    B. Aspiration pneumonia.  History of dysphagia, he remembers choking on food night of 03/22/2015, right middle lobe opacity, Leukocytosis, fever of 101 on 03/23/2015 with hypoxic respiratory failure. We will follow blood and sputum cultures, was on IV antibiotics per HCAP protocol, will taper ABX as clinically much improved on 03/25/2015, now off BiPAP, on nasal cannula oxygen along with nebulizer treatments and supportive care as needed.     2. 2 episodes of black loose stools on 03/25/2015. H&H is only mildly dropped, was Hemoccult positive, aspirin and heparin had to be stopped on 03/25/2015. Underwent EGD on 03/27/2015 confirming nonbleeding duodenal bulb ulcer. On IV PPI. H. pylori serology pending. GI on board. H&H remains stable when accounted for dilution and mild upper GI bleed.     3. Acute renal failure on chronic kidney disease stage III. Baseline creatinine close to 1.5. Was due to dehydration has resolved after hydration and now better or at baseline.    4. DM type II. She on Lantus along with sliding scale, will add pre-meal NovoLog monitor sugars closely.  Lab Results  Component Value Date   HGBA1C 7.7* 03/21/2015    CBG (last 3)   Recent Labs  03/27/15 1242 03/27/15 1717 03/27/15 2110  GLUCAP 182* 229* 179*     5.Toxic encephalopathy due to UTI upon admission. Has resolved. Is baseline. Continue antibiotics as in #1 above.    6.Dyslipidemia - on statin.    7.CAD - as in #1.    8.  Dysphagia. GI following. Has long-standing history of dysphagia after an episode of esophageal rupture in the past. Was nothing by mouth for 2 days per speech therapy. On 03/25/2015 swallowing improved and now placed on regular diet per speech. Continue to monitor.     Code Status: Full  Family Communication: Wife, son  - clearly explained poor prognosis and critical state.  Disposition Plan: TBD   Procedures    EGD  - Non bleeding Duodenal ulcer.   TTE  - Left ventricle: The cavity size  was normal. Wall thickness wasincreased in a pattern of mild LVH. Systolic function was normal.The estimated ejection fraction was in the range of 55% to 60%.There is hypokinesis of the basalinferolateral myocardium.Doppler parameters are consistent with abnormal left ventricularrelaxation (grade 1 diastolic dysfunction). Doppler parametersare consistent with high ventricular filling pressure. - Mitral valve: Calcified annulus.  Impressions: Mild hypokinesis of the basal inferolateral wall with overallnormal LV function; grade 1 diastolic dysfunction with elevatedLV filling pressure; calcified aortic valve; mean gradient of 70mmHg suggests mild AS but visually valve opens well.   Consults  Cards, GI, PCCM   Medications  Scheduled Meds: . amitriptyline  50 mg Oral QHS  . amLODipine  5 mg Oral Daily  . [START ON 03/29/2015] aspirin  81 mg Oral Pre-Cath  . clopidogrel  75 mg Oral Daily  . donepezil  10 mg Oral Daily  . insulin aspart  0-15 Units Subcutaneous TID WC  . insulin aspart  0-5 Units Subcutaneous QHS  . insulin aspart  4 Units Subcutaneous TID WC  . insulin glargine  20 Units Subcutaneous BID  . metoprolol succinate  12.5 mg Oral Daily  . omega-3 acid ethyl esters  1 g Oral BID  . [START ON 03/29/2015] pantoprazole (PROTONIX) IV  40 mg Intravenous Q12H  . piperacillin-tazobactam (ZOSYN)  IV  3.375 g Intravenous 3 times per day  . rosuvastatin  20 mg Oral Daily  . sodium chloride  3 mL Intravenous Q12H  . tamsulosin  0.4 mg Oral QHS   Continuous Infusions: . [START ON 03/29/2015] sodium chloride    . pantoprozole (PROTONIX) infusion 8 mg/hr (03/27/15 2154)   PRN Meds:.sodium chloride, acetaminophen **OR** acetaminophen, albuterol, alum & mag hydroxide-simeth, feeding supplement (GLUCERNA SHAKE), ondansetron **OR** ondansetron (ZOFRAN) IV, oxyCODONE, sodium chloride  DVT Prophylaxis  Heparin - SCDs   Lab Results  Component Value Date   PLT 159 03/28/2015     Antibiotics     Anti-infectives    Start     Dose/Rate Route Frequency Ordered Stop   03/23/15 0845  vancomycin (VANCOCIN) 1,250 mg in sodium chloride 0.9 % 250 mL IVPB  Status:  Discontinued     1,250 mg 166.7 mL/hr over 90 Minutes Intravenous Every 24 hours 03/23/15 0830 03/25/15 0955   03/23/15 0845  piperacillin-tazobactam (ZOSYN) IVPB 3.375 g     3.375 g 12.5 mL/hr over 240 Minutes Intravenous 3 times per day 03/23/15 0830     03/22/15 1500  cefTRIAXone (ROCEPHIN) 1 g in dextrose 5 % 50 mL IVPB - Premix  Status:  Discontinued     1 g 100 mL/hr over 30 Minutes Intravenous Every 24 hours 03/21/15 1710 03/23/15 0752   03/21/15 1500  cefTRIAXone (ROCEPHIN) 1 g in dextrose 5 % 50 mL IVPB     1 g 100 mL/hr over 30 Minutes Intravenous  Once 03/21/15 1452 03/21/15 1540          Objective:   Filed Vitals:   03/27/15 1928  03/28/15 0010 03/28/15 0422 03/28/15 0423  BP: 147/53 138/56  133/55  Pulse: 56 51  49  Temp: 98.6 F (37 C) 98.5 F (36.9 C)  98.3 F (36.8 C)  TempSrc: Oral Axillary  Axillary  Resp: 14 31  22   Height:      Weight:   117.9 kg (259 lb 14.8 oz)   SpO2: 98% 99%  99%    Wt Readings from Last 3 Encounters:  03/28/15 117.9 kg (259 lb 14.8 oz)  12/11/14 117.935 kg (260 lb)  11/14/14 116.62 kg (257 lb 1.6 oz)     Intake/Output Summary (Last 24 hours) at 03/28/15 0745 Last data filed at 03/28/15 0509  Gross per 24 hour  Intake   1790 ml  Output   1000 ml  Net    790 ml     Physical Exam  Awake Alert, Oriented X 3, No new F.N deficits, Normal affect,  [place].AT,PERRAL Supple Neck,No JVD, No cervical lymphadenopathy appriciated.  Symmetrical Chest wall movement, Good air movement bilaterally, RML rales improved RRR,No Gallops,Rubs or new Murmurs, No Parasternal Heave +ve B.Sounds, Abd Soft, No tenderness, No organomegaly appriciated, No rebound - guarding or rigidity. No Cyanosis, Clubbing or edema, No new Rash or bruise     Data Review    Micro Results Recent Results (from the past 240 hour(s))  Urine culture     Status: None   Collection Time: 03/21/15  3:02 PM  Result Value Ref Range Status   Specimen Description URINE, CLEAN CATCH  Final   Special Requests NONE  Final   Colony Count   Final    >=100,000 COLONIES/ML Performed at [facility]    Culture   Final    ESCHERICHIA COLI Performed at [facility]    Report Status 03/23/2015 FINAL  Final   Organism ID, Bacteria ESCHERICHIA COLI  Final      Susceptibility   Escherichia coli - MIC*    AMPICILLIN 8 SENSITIVE Sensitive     CEFAZOLIN <=4 SENSITIVE Sensitive     CEFTRIAXONE <=1 SENSITIVE Sensitive     CIPROFLOXACIN >=4 RESISTANT Resistant     GENTAMICIN <=1 SENSITIVE Sensitive     LEVOFLOXACIN >=8 RESISTANT Resistant     NITROFURANTOIN 64 INTERMEDIATE Intermediate     TOBRAMYCIN <=1 SENSITIVE Sensitive     TRIMETH/SULFA <=20 SENSITIVE Sensitive     PIP/TAZO <=4 SENSITIVE Sensitive     * ESCHERICHIA COLI  Urine culture     Status: None   Collection Time: 03/21/15  6:00 PM  Result Value Ref Range Status   Specimen Description URINE, RANDOM  Final   Special Requests NONE  Final   Colony Count NO GROWTH Performed at [facility]   Final   Culture NO GROWTH Performed at [facility]   Final   Report Status 03/23/2015 FINAL  Final  Clostridium Difficile by PCR     Status: None   Collection Time: 03/21/15  9:33 PM  Result Value Ref Range Status   C difficile by pcr NEGATIVE NEGATIVE Final  MRSA PCR Screening     Status: None   Collection Time: 03/23/15  8:06 AM  Result Value Ref Range Status   MRSA by PCR NEGATIVE NEGATIVE Final    Comment:        The GeneXpert MRSA Assay (FDA approved for NASAL specimens only), is one component of a comprehensive MRSA colonization surveillance program. It is not intended to diagnose MRSA infection nor to  guide or monitor treatment for MRSA infections.   Culture, blood  (routine x 2)     Status: None (Preliminary result)   Collection Time: 03/23/15  8:25 AM  Result Value Ref Range Status   Specimen Description BLOOD RIGHT ARM  Final   Special Requests BOTTLES DRAWN AEROBIC AND ANAEROBIC 10CC  Final   Culture   Final           BLOOD CULTURE RECEIVED NO GROWTH TO DATE CULTURE WILL BE HELD FOR 5 DAYS BEFORE ISSUING A FINAL NEGATIVE REPORT Performed at [facility]    Report Status PENDING  Incomplete  Culture, blood (routine x 2)     Status: None (Preliminary result)   Collection Time: 03/23/15  8:32 AM  Result Value Ref Range Status   Specimen Description BLOOD RIGHT ARM  Final   Special Requests BOTTLES DRAWN AEROBIC AND ANAEROBIC [place]  Final   Culture   Final           BLOOD CULTURE RECEIVED NO GROWTH TO DATE CULTURE WILL BE HELD FOR 5 DAYS BEFORE ISSUING A FINAL NEGATIVE REPORT Performed at [facility]    Report Status PENDING  Incomplete  Culture, blood (routine x 2)     Status: None (Preliminary result)   Collection Time: 03/23/15 10:00 AM  Result Value Ref Range Status   Specimen Description BLOOD LEFT HAND  Final   Special Requests BOTTLES DRAWN AEROBIC ONLY 2CC  Final   Culture   Final           BLOOD CULTURE RECEIVED NO GROWTH TO DATE CULTURE WILL BE HELD FOR 5 DAYS BEFORE ISSUING A FINAL NEGATIVE REPORT Performed at [facility]    Report Status PENDING  Incomplete  Culture, blood (routine x 2)     Status: None (Preliminary result)   Collection Time: 03/23/15 10:15 AM  Result Value Ref Range Status   Specimen Description BLOOD RIGHT HAND  Final   Special Requests BOTTLES DRAWN AEROBIC ONLY 2CC  Final   Culture   Final           BLOOD CULTURE RECEIVED NO GROWTH TO DATE CULTURE WILL BE HELD FOR 5 DAYS BEFORE ISSUING A FINAL NEGATIVE REPORT Performed at [facility]    Report Status PENDING  Incomplete    Radiology Reports   X-ray Chest Pa And Lateral  03/22/2015   CLINICAL DATA:  Shortness of breath   EXAM: CHEST  2 VIEW  COMPARISON:  11/14/2009  FINDINGS: No cardiomegaly for technique. Aortic and hilar contours are negative. 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 No edema, consolidation, effusion, or pneumothorax.  IMPRESSION: No active cardiopulmonary disease.   Electronically Signed   By: Monte Fantasia M.D.   On: 03/22/2015 01:10   Dg Esophagus  02/27/2015   CLINICAL DATA:  Dysphagia  EXAM: ESOPHOGRAM / BARIUM SWALLOW / BARIUM TABLET STUDY  TECHNIQUE: Combined double contrast and single contrast examination performed using effervescent crystals, thick barium liquid, and thin barium liquid. The patient was observed with fluoroscopy swallowing a 60mm barium sulphate tablet.  FLUOROSCOPY TIME:  4 minutes 0 seconds  COMPARISON:  None  FINDINGS: No cervical esophageal narrowing. Cervical plate noted from anterior fusion at C3-4. Separate plate at N8-2.  Fluoroscopic evaluation of swallowing demonstrates disruption of 3 out of 4 primary esophageal peristaltic waves with moderate tertiary contractions. No fold thickening.  There is mild distal esophageal short segment stricture, best seen on the  upright views and after administration of the barium tablet. The barium tablet sticks for several minutes in the distal esophagus. The tablet also initially sticks to the right of midline in the cervical esophagus, reproduce in the patient's symptoms. This is presumably within the right piriform sinus. The barium tablet finally passes into the esophagus but lodges distally in area of mild distal esophageal stricture.  IMPRESSION: The barium tablet initially lodges in the right piriform sinus, reproducing the patient's symptoms. This finally passes into the esophagus, but lodges in the distal esophagus at an area of mild short segment distal esophageal stricture.  Esophageal dysmotility with disruption of primary peristaltic waves an moderate tertiary contractions.   Electronically Signed    By: Rolm Baptise M.D.   On: 02/27/2015 12:38   US Renal  03/24/2015   CLINICAL DATA:  74 year old male with acute renal failure.  EXAM: RENAL/URINARY TRACT ULTRASOUND COMPLETE  COMPARISON:  None.  FINDINGS: Right Kidney:  Length: 10.2 cm. A 3.8 x 3.9 cm lower pole cyst is present. Echogenicity within normal limits. No mass or hydronephrosis visualized.  Left Kidney:  Length: 11.5 cm. Echogenicity within normal limits. No mass or hydronephrosis visualized.  Bladder:  Appears normal for degree of bladder distention.  IMPRESSION: Right renal cyst, otherwise unremarkable renal ultrasound. No evidence of hydronephrosis.   Electronically Signed   By: Margarette Canada M.D.   On: 03/24/2015 12:18   Dg Chest Port 1 View  03/24/2015   CLINICAL DATA:  Shortness of breath and respiratory failure.  EXAM: PORTABLE CHEST - 1 VIEW  COMPARISON:  03/23/2015 and prior radiographs  FINDINGS: The cardiomediastinal silhouette is unchanged.  Right lung airspace disease is again identified.  No pneumothorax or definite effusion noted.  Elevation of right hemidiaphragm again identified.  Prior cervical fusion changes noted.  IMPRESSION: Right lung airspace disease - not significantly changed.   Electronically Signed   By: Margarette Canada M.D.   On: 03/24/2015 09:57   Dg Chest Port 1 View  03/23/2015   CLINICAL DATA:  Shortness of breath.  EXAM: PORTABLE CHEST - 1 VIEW  COMPARISON:  03/21/2015  FINDINGS: Lungs are hypoinflated and demonstrate a new moderate airspace process over the central right lung. No evidence of effusion or pneumothorax. Cardiomediastinal silhouette and remainder of the exam is unchanged.  IMPRESSION: New moderate airspace process over the central right lung likely infection and less likely asymmetric edema.   Electronically Signed   By: Marin Olp M.D.   On: 03/23/2015 08:12     CBC  Recent Labs Lab 03/24/15 0445 03/25/15 0324 03/25/15 2004 03/25/15 2300 03/26/15 0501 03/27/15 0230 03/28/15 0325  WBC  10.0 8.4  --   --  6.9 6.9 7.0  HGB 12.4* 12.1* 15.8 10.8* 11.3* 10.6* 10.2*  HCT 37.6* 36.8* 50.6 33.4* 34.6* 31.4* 30.3*  PLT 123* 120*  --   --  127* 252 159  MCV 89.1 91.1  --   --  91.1 89.7 90.7  MCH 29.4 30.0  --   --  29.7 30.3 30.5  MCHC 33.0 32.9  --   --  32.7 33.8 33.7  RDW 15.2 15.3  --   --  15.4 15.2 15.1    Chemistries   Recent Labs Lab 03/21/15 1326  03/23/15 1018 03/24/15 0445 03/25/15 0324 03/26/15 0501 03/27/15 0230 03/28/15 0325  NA 136  < > 135 139 146* 142 141 142  K 3.9  < > 3.9 4.0 3.8 3.6 4.3 3.5  CL 99  < > 98 103 109 108 110 107  CO2 24  < > 22 21 33* 25 23 24   GLUCOSE 299*  < > 205* 202* 112* 230* 178* 146*  BUN 27*  < > 49* 52* 50* 41* 29* 21  CREATININE 2.06*  < > 2.44* 2.08* 1.83* 1.63* 1.47* 1.36*  CALCIUM 8.6  < > 8.3* 8.0* 8.5 8.5 8.2* 8.5  AST 22  --  67*  --   --   --   --   --   ALT 14  --  22  --   --   --   --   --   ALKPHOS 69  --  64  --   --   --   --   --   BILITOT 1.5*  --  1.1  --   --   --   --   --   < > = values in this interval not displayed. ------------------------------------------------------------------------------------------------------------------ estimated creatinine clearance is 60.4 mL/min (by C-G formula based on Cr of 1.36). ------------------------------------------------------------------------------------------------------------------ No results for input(s): HGBA1C in the last 72 hours. ------------------------------------------------------------------------------------------------------------------ No results for input(s): CHOL, HDL, LDLCALC, TRIG, CHOLHDL, LDLDIRECT in the last 72 hours. ------------------------------------------------------------------------------------------------------------------ No results for input(s): TSH, T4TOTAL, T3FREE, THYROIDAB in the last 72 hours.  Invalid input(s):  FREET3 ------------------------------------------------------------------------------------------------------------------ No results for input(s): VITAMINB12, FOLATE, FERRITIN, TIBC, IRON, RETICCTPCT in the last 72 hours.  Coagulation profile  Recent Labs Lab 03/21/15 1326  INR 1.12    No results for input(s): DDIMER in the last 72 hours.  Cardiac Enzymes  Recent Labs Lab 03/23/15 1010 03/23/15 1628 03/23/15 2240  TROPONINI 9.21* 23.54* 26.86*   ------------------------------------------------------------------------------------------------------------------ Invalid input(s): POCBNP   Time Spent in minutes   35   Kimball Appleby K M.D on 03/28/2015 at 7:45 AM  Between 7am to 7pm - Pager - [phone]  After 7pm go to [email or web address] - password [facility]  [facility]   Office  [phone]

## 2015-03-28 NOTE — Progress Notes (Signed)
Patient Name: Quinnton S Martinique Date of Encounter: 03/28/2015     Principal Problem:   Sepsis Active Problems:   Memory impairment   Hyperlipidemia   Diabetes 1.5, managed as type 1   CVA (cerebral infarction)   CAD S/P percutaneous coronary angioplasty   Bilateral leg edema   Obstructive sleep apnea   Acute renal failure   Mild diastolic dysfunction   Subendocardial MI subsequent episode care   Acute respiratory failure with hypoxia   NSTEMI (non-ST elevated myocardial infarction)   AKI (acute kidney injury)    SUBJECTIVE  No chest pain.  No shortness of breath.  Oxygen level 100% on room air.  He does have mild peripheral edema.  Lasix currently on hold.  After cardiac catheterization he will need diuretic when he goes home once we know that his renal function is stable post catheterization  CURRENT MEDS . amitriptyline  50 mg Oral QHS  . amLODipine  5 mg Oral Daily  . [START ON 03/29/2015] aspirin  81 mg Oral Pre-Cath  . clopidogrel  75 mg Oral Daily  . donepezil  10 mg Oral Daily  . insulin aspart  0-15 Units Subcutaneous TID WC  . insulin aspart  0-5 Units Subcutaneous QHS  . insulin aspart  4 Units Subcutaneous TID WC  . insulin glargine  20 Units Subcutaneous BID  . metoprolol succinate  12.5 mg Oral Daily  . omega-3 acid ethyl esters  1 g Oral BID  . [START ON 03/29/2015] pantoprazole (PROTONIX) IV  40 mg Intravenous Q12H  . piperacillin-tazobactam (ZOSYN)  IV  3.375 g Intravenous 3 times per day  . rosuvastatin  20 mg Oral Daily  . sodium chloride  3 mL Intravenous Q12H  . tamsulosin  0.4 mg Oral QHS    OBJECTIVE  Filed Vitals:   03/28/15 0010 03/28/15 0422 03/28/15 0423 03/28/15 0806  BP: 138/56  133/55   Pulse: 51  49   Temp: 98.5 F (36.9 C)  98.3 F (36.8 C) 97.4 F (36.3 C)  TempSrc: Axillary  Axillary Oral  Resp: 31  22   Height:      Weight:  259 lb 14.8 oz (117.9 kg)    SpO2: 99%  99%     Intake/Output Summary (Last 24 hours) at 03/28/15  0926 Last data filed at 03/28/15 0509  Gross per 24 hour  Intake   1790 ml  Output   1000 ml  Net    790 ml   Filed Weights   03/25/15 0500 03/27/15 0500 03/28/15 0422  Weight: 253 lb 15.5 oz (115.2 kg) 256 lb 13.4 oz (116.5 kg) 259 lb 14.8 oz (117.9 kg)    PHYSICAL EXAM  General: Pleasant, NAD. Neuro: Alert and oriented X 3. Moves all extremities spontaneously. Psych: Normal affect. HEENT:  Normal  Neck: Supple without bruits or JVD. Lungs:  Resp regular and unlabored, CTA. Heart: RRR no s3, s4, grade 2/6 systolic ejection murmur. Abdomen: Soft, non-tender, non-distended, BS + x 4.  Extremities: 2+ pedal and pretibial edema  Accessory Clinical Findings  CBC  Recent Labs  03/27/15 0230 03/28/15 0325  WBC 6.9 7.0  HGB 10.6* 10.2*  HCT 31.4* 30.3*  MCV 89.7 90.7  PLT 252 416   Basic Metabolic Panel  Recent Labs  03/27/15 0230 03/28/15 0325  NA 141 142  K 4.3 3.5  CL 110 107  CO2 23 24  GLUCOSE 178* 146*  BUN 29* 21  CREATININE 1.47* 1.36*  CALCIUM 8.2*  8.5   Liver Function Tests No results for input(s): AST, ALT, ALKPHOS, BILITOT, PROT, ALBUMIN in the last 72 hours. No results for input(s): LIPASE, AMYLASE in the last 72 hours. Cardiac Enzymes No results for input(s): CKTOTAL, CKMB, CKMBINDEX, TROPONINI in the last 72 hours. BNP Invalid input(s): POCBNP D-Dimer No results for input(s): DDIMER in the last 72 hours. Hemoglobin A1C No results for input(s): HGBA1C in the last 72 hours. Fasting Lipid Panel No results for input(s): CHOL, HDL, LDLCALC, TRIG, CHOLHDL, LDLDIRECT in the last 72 hours. Thyroid Function Tests No results for input(s): TSH, T4TOTAL, T3FREE, THYROIDAB in the last 72 hours.  Invalid input(s): FREET3  TELE normal sinus rhythm  ECG    Radiology/Studies  X-ray Chest Pa And Lateral  03/22/2015   CLINICAL DATA:  Shortness of breath  EXAM: CHEST  2 VIEW  COMPARISON:  11/14/2009  FINDINGS: No cardiomegaly for technique. Aortic  and hilar contours are negative. 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 No edema, consolidation, effusion, or pneumothorax.  IMPRESSION: No active cardiopulmonary disease.   Electronically Signed   By: Monte Fantasia M.D.   On: 03/22/2015 01:10   Dg Esophagus  02/27/2015   CLINICAL DATA:  Dysphagia  EXAM: ESOPHOGRAM / BARIUM SWALLOW / BARIUM TABLET STUDY  TECHNIQUE: Combined double contrast and single contrast examination performed using effervescent crystals, thick barium liquid, and thin barium liquid. The patient was observed with fluoroscopy swallowing a 74mm barium sulphate tablet.  FLUOROSCOPY TIME:  4 minutes 0 seconds  COMPARISON:  None  FINDINGS: No cervical esophageal narrowing. Cervical plate noted from anterior fusion at C3-4. Separate plate at A5-4.  Fluoroscopic evaluation of swallowing demonstrates disruption of 3 out of 4 primary esophageal peristaltic waves with moderate tertiary contractions. No fold thickening.  There is mild distal esophageal short segment stricture, best seen on the upright views and after administration of the barium tablet. The barium tablet sticks for several minutes in the distal esophagus. The tablet also initially sticks to the right of midline in the cervical esophagus, reproduce in the patient's symptoms. This is presumably within the right piriform sinus. The barium tablet finally passes into the esophagus but lodges distally in area of mild distal esophageal stricture.  IMPRESSION: The barium tablet initially lodges in the right piriform sinus, reproducing the patient's symptoms. This finally passes into the esophagus, but lodges in the distal esophagus at an area of mild short segment distal esophageal stricture.  Esophageal dysmotility with disruption of primary peristaltic waves an moderate tertiary contractions.   Electronically Signed   By: Rolm Baptise M.D.   On: 02/27/2015 12:38   US Renal  03/24/2015   CLINICAL DATA:   74 year old male with acute renal failure.  EXAM: RENAL/URINARY TRACT ULTRASOUND COMPLETE  COMPARISON:  None.  FINDINGS: Right Kidney:  Length: 10.2 cm. A 3.8 x 3.9 cm lower pole cyst is present. Echogenicity within normal limits. No mass or hydronephrosis visualized.  Left Kidney:  Length: 11.5 cm. Echogenicity within normal limits. No mass or hydronephrosis visualized.  Bladder:  Appears normal for degree of bladder distention.  IMPRESSION: Right renal cyst, otherwise unremarkable renal ultrasound. No evidence of hydronephrosis.   Electronically Signed   By: Margarette Canada M.D.   On: 03/24/2015 12:18   Dg Chest Port 1 View  03/24/2015   CLINICAL DATA:  Shortness of breath and respiratory failure.  EXAM: PORTABLE CHEST - 1 VIEW  COMPARISON:  03/23/2015 and prior radiographs  FINDINGS: The cardiomediastinal  silhouette is unchanged.  Right lung airspace disease is again identified.  No pneumothorax or definite effusion noted.  Elevation of right hemidiaphragm again identified.  Prior cervical fusion changes noted.  IMPRESSION: Right lung airspace disease - not significantly changed.   Electronically Signed   By: Margarette Canada M.D.   On: 03/24/2015 09:57   Dg Chest Port 1 View  03/23/2015   CLINICAL DATA:  Shortness of breath.  EXAM: PORTABLE CHEST - 1 VIEW  COMPARISON:  03/21/2015  FINDINGS: Lungs are hypoinflated and demonstrate a new moderate airspace process over the central right lung. No evidence of effusion or pneumothorax. Cardiomediastinal silhouette and remainder of the exam is unchanged.  IMPRESSION: New moderate airspace process over the central right lung likely infection and less likely asymmetric edema.   Electronically Signed   By: Marin Olp M.D.   On: 03/23/2015 08:12    ASSESSMENT AND PLAN  1. NSTEMI - 2D echo 03/22/15 - mild HK of basal inferolateral wall with EF 55-60%, grade 1 DD, calcified aortic valve with gradient suggesting mild AS but visually valve opens well - he remains on Plavix  alone for now   2. Melena with mild anemia - endoscopy today showed distal esophageal ulcers, significant bulb ulcerations without signs of bleeding, shallow ulcerations in duodenum, otherwise normal without active bleeding - H Pylori bx pending - based on endoscopy result Dr. Watt Climes feels OK to proceed with catheterization but care with blood thinners post-cath and BID PPI on board  2. AKI on CKD stage III - Cr continues to improve - avoid nephrotoxins for now - would avoid LV gram during cath given renal insufficiency and known EF Consider bare metal stent in view of his history of GI bleeding.  Signed, Darlin Coco MD

## 2015-03-28 NOTE — CV Procedure (Signed)
    Cardiac Catheterization Procedure Note  Name: Randall Wilkins MRN: [id number] DOB: 31-May-1941  Procedure: Left Heart Cath, Selective Coronary Angiography, LV angiography  Indication: 74 yo WM with history of CAD s/p remote stent of the RCA presents with a NSTEMI in the setting of a UTI.    Procedural Details: The right wrist was prepped, draped, and anesthetized with 1% lidocaine. Using the modified Seldinger technique, a 6 French slender sheath was introduced into the right radial artery. 3 mg of verapamil was administered through the sheath, weight-based unfractionated heparin was administered intravenously. Standard Judkins catheters were used for selective coronary angiography and left ventriculography. Catheter exchanges were performed over an exchange length guidewire. There were no immediate procedural complications. A TR band was used for radial hemostasis at the completion of the procedure.  The patient was transferred to the post catheterization recovery area for further monitoring.  Procedural Findings: Hemodynamics: AO 137/50 mean 79 mm Hg LV 144/26 mm Hg   Coronary angiography: Coronary dominance: right  Left mainstem: 95% distal left main stenosis.  Left anterior descending (LAD): There is 95% ostial LAD stenosis. The LAD is a large vessel. The LAD and diagonal otherwise are without significant disease.  There is a large ramus intermediate branch with 99% ostial stenosis.  Left circumflex (LCx): 100% ostial occlusion with faint left to left collaterals.  Right coronary artery (RCA): the RCA is a large dominant branch. The stent in the proximal vessel is widely patent. There is a 60% stenosis in the mid PDA. The third PLOM is moderate in size with a long 90-95% stenosis prior to distal arborization.   Left ventriculography: Not done.  Final Conclusions:   1. Critical left main and three vessel obstructive CAD 2. Elevated LVEDP.  Recommendations: will transfer to  ICU. Patient is without pain and hemodynamically stable. Will start low dose IV Ntg. Resume IV heparin in 8 hours. Plavix will be held. Gentle diuresis. CT surgery consult called.   [name], [place]  03/28/2015, 3:05 PM

## 2015-03-28 NOTE — Progress Notes (Signed)
CPAP has been setup at bedside. Pt states that he will call when he is ready to go on. RT will monitor.

## 2015-03-28 NOTE — Progress Notes (Signed)
ANTIBIOTIC CONSULT  - Follow up   Pharmacy Consult for Zosyn Indication: Aspiration PNA/Sepsis  Allergies  Allergen Reactions  . Actos [Pioglitazone] Swelling  . Metformin And Related Nausea Only  . Niaspan [Niacin Er] Itching and Rash    Patient Measurements: Height: 5\' 9"  (175.3 cm) Weight: 259 lb 14.8 oz (117.9 kg) IBW/kg (Calculated) : 70.7 Heparin Dosing Weight: 97kg   Vital Signs: Temp: 98.1 F (36.7 C) (04/14 1056) Temp Source: Oral (04/14 1056) BP: 143/49 mmHg (04/14 1150) Pulse Rate: 46 (04/14 1150)  Labs:  Recent Labs  03/26/15 0501 03/27/15 0230 03/28/15 0325  HGB 11.3* 10.6* 10.2*  HCT 34.6* 31.4* 30.3*  PLT 127* 252 159  CREATININE 1.63* 1.47* 1.36*    Estimated Creatinine Clearance: 60.4 mL/min (by C-G formula based on Cr of 1.36).   Assessment: 74yoM presented to the ED on 4/7 w/ generalized weakness and confusion. Pt also complaining of urinary frequency which he recently went to the MD for and was prescribed flomax, without relief. Wife states that patient fell 3 times w/out hitting his head or LOC.  Pharmacy consulted to dose zosyn.    PMH: stroke, MI, CAD, DM, Rhabdom short term memory loss, HTN  Infectious Disease: UTI dx in PCPs office, also w/ diarrhea pta; r/o sepsis, PNA WBC wnl, afebrile  CTX 4/7>>4/8 Zosyn 4/9>> Vanc 4/9>>4/11  Cdiff 4/7>>neg UCx 4/7>>Ecoli (I nitro) UCx 4/7>>neg BCx4/9>>ngtd GI path pending  UA 4/7>> large leuks, nitrite neg, few SEs, few bacteria  Goal of Therapy:  Renal adjustment of antibiotics.   Plan:  Zosyn 3.375g IV q8h infuse over 4h Follow up SCr, UOP, cultures, clinical course and adjust as clinically indicated. Follow up what is plan for length of antibiotic therapy  Thank you for allowing pharmacy to be a part of this patients care team.  Rowe Robert Pharm.D., BCPS, AQ-Cardiology Clinical Pharmacist 03/28/2015 1:05 PM Pager: [phone] Phone: [phone]

## 2015-03-28 NOTE — H&P (View-Only) (Signed)
Patient Name: Randall Wilkins Date of Encounter: 03/28/2015     Principal Problem:   Sepsis Active Problems:   Memory impairment   Hyperlipidemia   Diabetes 1.5, managed as type 1   CVA (cerebral infarction)   CAD S/P percutaneous coronary angioplasty   Bilateral leg edema   Obstructive sleep apnea   Acute renal failure   Mild diastolic dysfunction   Subendocardial MI subsequent episode care   Acute respiratory failure with hypoxia   NSTEMI (non-ST elevated myocardial infarction)   AKI (acute kidney injury)    SUBJECTIVE  No chest pain.  No shortness of breath.  Oxygen level 100% on room air.  He does have mild peripheral edema.  Lasix currently on hold.  After cardiac catheterization he will need diuretic when he goes home once we know that his renal function is stable post catheterization  CURRENT MEDS . amitriptyline  50 mg Oral QHS  . amLODipine  5 mg Oral Daily  . [START ON 03/29/2015] aspirin  81 mg Oral Pre-Cath  . clopidogrel  75 mg Oral Daily  . donepezil  10 mg Oral Daily  . insulin aspart  0-15 Units Subcutaneous TID WC  . insulin aspart  0-5 Units Subcutaneous QHS  . insulin aspart  4 Units Subcutaneous TID WC  . insulin glargine  20 Units Subcutaneous BID  . metoprolol succinate  12.5 mg Oral Daily  . omega-3 acid ethyl esters  1 g Oral BID  . [START ON 03/29/2015] pantoprazole (PROTONIX) IV  40 mg Intravenous Q12H  . piperacillin-tazobactam (ZOSYN)  IV  3.375 g Intravenous 3 times per day  . rosuvastatin  20 mg Oral Daily  . sodium chloride  3 mL Intravenous Q12H  . tamsulosin  0.4 mg Oral QHS    OBJECTIVE  Filed Vitals:   03/28/15 0010 03/28/15 0422 03/28/15 0423 03/28/15 0806  BP: 138/56  133/55   Pulse: 51  49   Temp: 98.5 F (36.9 C)  98.3 F (36.8 C) 97.4 F (36.3 C)  TempSrc: Axillary  Axillary Oral  Resp: 31  22   Height:      Weight:  259 lb 14.8 oz (117.9 kg)    SpO2: 99%  99%     Intake/Output Summary (Last 24 hours) at 03/28/15  0926 Last data filed at 03/28/15 0509  Gross per 24 hour  Intake   1790 ml  Output   1000 ml  Net    790 ml   Filed Weights   03/25/15 0500 03/27/15 0500 03/28/15 0422  Weight: 253 lb 15.5 oz (115.2 kg) 256 lb 13.4 oz (116.5 kg) 259 lb 14.8 oz (117.9 kg)    PHYSICAL EXAM  General: Pleasant, NAD. Neuro: Alert and oriented X 3. Moves all extremities spontaneously. Psych: Normal affect. HEENT:  Normal  Neck: Supple without bruits or JVD. Lungs:  Resp regular and unlabored, CTA. Heart: RRR no s3, s4, grade 2/6 systolic ejection murmur. Abdomen: Soft, non-tender, non-distended, BS + x 4.  Extremities: 2+ pedal and pretibial edema  Accessory Clinical Findings  CBC  Recent Labs  03/27/15 0230 03/28/15 0325  WBC 6.9 7.0  HGB 10.6* 10.2*  HCT 31.4* 30.3*  MCV 89.7 90.7  PLT 252 779   Basic Metabolic Panel  Recent Labs  03/27/15 0230 03/28/15 0325  NA 141 142  K 4.3 3.5  CL 110 107  CO2 23 24  GLUCOSE 178* 146*  BUN 29* 21  CREATININE 1.47* 1.36*  CALCIUM 8.2*  8.5   Liver Function Tests No results for input(s): AST, ALT, ALKPHOS, BILITOT, PROT, ALBUMIN in the last 72 hours. No results for input(s): LIPASE, AMYLASE in the last 72 hours. Cardiac Enzymes No results for input(s): CKTOTAL, CKMB, CKMBINDEX, TROPONINI in the last 72 hours. BNP Invalid input(s): POCBNP D-Dimer No results for input(s): DDIMER in the last 72 hours. Hemoglobin A1C No results for input(s): HGBA1C in the last 72 hours. Fasting Lipid Panel No results for input(s): CHOL, HDL, LDLCALC, TRIG, CHOLHDL, LDLDIRECT in the last 72 hours. Thyroid Function Tests No results for input(s): TSH, T4TOTAL, T3FREE, THYROIDAB in the last 72 hours.  Invalid input(s): FREET3  TELE normal sinus rhythm  ECG    Radiology/Studies  X-ray Chest Pa And Lateral  03/22/2015   CLINICAL DATA:  Shortness of breath  EXAM: CHEST  2 VIEW  COMPARISON:  11/14/2009  FINDINGS: No cardiomegaly for technique. Aortic  and hilar contours are negative. 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 No edema, consolidation, effusion, or pneumothorax.  IMPRESSION: No active cardiopulmonary disease.   Electronically Signed   By: Monte Fantasia M.D.   On: 03/22/2015 01:10   Dg Esophagus  02/27/2015   CLINICAL DATA:  Dysphagia  EXAM: ESOPHOGRAM / BARIUM SWALLOW / BARIUM TABLET STUDY  TECHNIQUE: Combined double contrast and single contrast examination performed using effervescent crystals, thick barium liquid, and thin barium liquid. The patient was observed with fluoroscopy swallowing a 14mm barium sulphate tablet.  FLUOROSCOPY TIME:  4 minutes 0 seconds  COMPARISON:  None  FINDINGS: No cervical esophageal narrowing. Cervical plate noted from anterior fusion at C3-4. Separate plate at B7-6.  Fluoroscopic evaluation of swallowing demonstrates disruption of 3 out of 4 primary esophageal peristaltic waves with moderate tertiary contractions. No fold thickening.  There is mild distal esophageal short segment stricture, best seen on the upright views and after administration of the barium tablet. The barium tablet sticks for several minutes in the distal esophagus. The tablet also initially sticks to the right of midline in the cervical esophagus, reproduce in the patient's symptoms. This is presumably within the right piriform sinus. The barium tablet finally passes into the esophagus but lodges distally in area of mild distal esophageal stricture.  IMPRESSION: The barium tablet initially lodges in the right piriform sinus, reproducing the patient's symptoms. This finally passes into the esophagus, but lodges in the distal esophagus at an area of mild short segment distal esophageal stricture.  Esophageal dysmotility with disruption of primary peristaltic waves an moderate tertiary contractions.   Electronically Signed   By: Rolm Baptise M.D.   On: 02/27/2015 12:38   US Renal  03/24/2015   CLINICAL DATA:   74 year old male with acute renal failure.  EXAM: RENAL/URINARY TRACT ULTRASOUND COMPLETE  COMPARISON:  None.  FINDINGS: Right Kidney:  Length: 10.2 cm. A 3.8 x 3.9 cm lower pole cyst is present. Echogenicity within normal limits. No mass or hydronephrosis visualized.  Left Kidney:  Length: 11.5 cm. Echogenicity within normal limits. No mass or hydronephrosis visualized.  Bladder:  Appears normal for degree of bladder distention.  IMPRESSION: Right renal cyst, otherwise unremarkable renal ultrasound. No evidence of hydronephrosis.   Electronically Signed   By: Margarette Canada M.D.   On: 03/24/2015 12:18   Dg Chest Port 1 View  03/24/2015   CLINICAL DATA:  Shortness of breath and respiratory failure.  EXAM: PORTABLE CHEST - 1 VIEW  COMPARISON:  03/23/2015 and prior radiographs  FINDINGS: The cardiomediastinal  silhouette is unchanged.  Right lung airspace disease is again identified.  No pneumothorax or definite effusion noted.  Elevation of right hemidiaphragm again identified.  Prior cervical fusion changes noted.  IMPRESSION: Right lung airspace disease - not significantly changed.   Electronically Signed   By: Margarette Canada M.D.   On: 03/24/2015 09:57   Dg Chest Port 1 View  03/23/2015   CLINICAL DATA:  Shortness of breath.  EXAM: PORTABLE CHEST - 1 VIEW  COMPARISON:  03/21/2015  FINDINGS: Lungs are hypoinflated and demonstrate a new moderate airspace process over the central right lung. No evidence of effusion or pneumothorax. Cardiomediastinal silhouette and remainder of the exam is unchanged.  IMPRESSION: New moderate airspace process over the central right lung likely infection and less likely asymmetric edema.   Electronically Signed   By: Marin Olp M.D.   On: 03/23/2015 08:12    ASSESSMENT AND PLAN  1. NSTEMI - 2D echo 03/22/15 - mild HK of basal inferolateral wall with EF 55-60%, grade 1 DD, calcified aortic valve with gradient suggesting mild AS but visually valve opens well - he remains on Plavix  alone for now   2. Melena with mild anemia - endoscopy today showed distal esophageal ulcers, significant bulb ulcerations without signs of bleeding, shallow ulcerations in duodenum, otherwise normal without active bleeding - H Pylori bx pending - based on endoscopy result Dr. Watt Climes feels OK to proceed with catheterization but care with blood thinners post-cath and BID PPI on board  2. AKI on CKD stage III - Cr continues to improve - avoid nephrotoxins for now - would avoid LV gram during cath given renal insufficiency and known EF Consider bare metal stent in view of his history of GI bleeding.  Signed, Darlin Coco MD

## 2015-03-28 NOTE — Interval H&P Note (Signed)
History and Physical Interval Note:  03/28/2015 2:33 PM  Jimmie S Martinique  has presented today for surgery, with the diagnosis of cp  The various methods of treatment have been discussed with the patient and family. After consideration of risks, benefits and other options for treatment, the patient has consented to  Procedure(s): LEFT HEART CATHETERIZATION WITH CORONARY ANGIOGRAM (N/A) as a surgical intervention .  The patient's history has been reviewed, patient examined, no change in status, stable for surgery.  I have reviewed the patient's chart and labs.  Questions were answered to the patient's satisfaction.    Cath Lab Visit (complete for each Cath Lab visit)  Clinical Evaluation Leading to the Procedure:   ACS: Yes.    Non-ACS:    Anginal Classification: CCS III  Anti-ischemic medical therapy: Maximal Therapy (2 or more classes of medications)  Non-Invasive Test Results: No non-invasive testing performed  Prior CABG: No previous CABG       Collier Salina [facility] 03/28/2015 2:33 PM

## 2015-03-28 NOTE — Progress Notes (Signed)
ANTICOAGULATION CONSULT NOTE - Follow up [facility] for Heparin Indication: chest pain/ACS  Allergies  Allergen Reactions  . Actos [Pioglitazone] Swelling  . Metformin And Related Nausea Only  . Niaspan [Niacin Er] Itching and Rash    Patient Measurements: Height: 5\' 9"  (175.3 cm) Weight: 259 lb 14.8 oz (117.9 kg) IBW/kg (Calculated) : 70.7 Heparin Dosing Weight: 97kg   Vital Signs: Temp: 98.4 F (36.9 C) (04/14 1600) Temp Source: Oral (04/14 1600) BP: 141/46 mmHg (04/14 1600) Pulse Rate: 52 (04/14 1600)  Labs:  Recent Labs  03/26/15 0501 03/27/15 0230 03/28/15 0325  HGB 11.3* 10.6* 10.2*  HCT 34.6* 31.4* 30.3*  PLT 127* 252 159  CREATININE 1.63* 1.47* 1.36*    Estimated Creatinine Clearance: 60.4 mL/min (by C-G formula based on Cr of 1.36).   Medications:  Infusions:  . sodium chloride    . nitroGLYCERIN      Assessment: 74yoM presented to the ED on 4/7 w/ generalized weakness and confusion. Troponin found to be elevated at 9.21 and pharmacy consulted to start heparin for ACS.  Troponin >> 26.86  Heparin level therapeutic CBC stable   Pharmacy is consulted to restart heparin 8h after sheath removal. Sheath was removed at 1500. Patient is being treated for GIB with BID PPI covered by GI. Pt underwent cath today which revealed critical L main and 3 vessel obstructive CAD and elevated LVEDP. Hgb 10.2, Plt 159, sCr 1.4.  Goal of Therapy:  Heparin level 0.3-0.7 units/ml Monitor platelets by anticoagulation protocol: Yes   Plan:  Restart heparin 1650 units/hr at 2300 Daily HL/CBC Monitor s/sx of bleeding  Andrey Cota. Diona Foley, PharmD Clinical Pharmacist Pager [phone]  03/28/2015 5:20 PM

## 2015-03-28 NOTE — Progress Notes (Signed)
OT Cancellation Note  Patient Details Name: Nicky S Martinique MRN: [id number] DOB: January 13, 1941   Cancelled Treatment:    Reason Eval/Treat Not Completed: Patient at procedure or test/ unavailable. Pt at cath lab for procedure. Acute OT to follow up follow cath to continue with POC and potentially re-assess.   [place] [facility] M   Cyndie Chime, OTR/L Occupational Therapist [phone] (pager)  03/28/2015, 2:55 PM

## 2015-03-28 NOTE — Consult Note (Signed)
[address]       [place] [address]             [phone]          CARDIOTHORACIC SURGERY CONSULTATION REPORT  PCP is Simona Huh, MD Referring Provider is Martinique, Ander Slade, MD Primary Cardiologist is Pixie Casino, MD  Reason for consultation:  Left main coronary artery disease s/p acute non-STEMI  HPI:  Patient is a 74 year old obese white male with multiple medical problems including known history of coronary artery disease, hypertension, diabetes mellitus with complication, chronic kidney disease, chronic diastolic congestive heart failure, chronic venous insufficiency, remote history of stroke, mild chronic short term memory impairment, and peripheral neuropathy moderately decreased physical mobility who was in his usual state of health until last week when he developed fairly acute onset increased confusion, urinary frequency and a fall that resulted in a large hematoma in his left flank.  The patient was taken to his primary care physician's office where he was seen by Dr. Alroy Dust and diagnosed with acute urinary tract infection. He was directly admitted to the hospital for intravenous antibiotics and further therapy on 03/21/2015.  Urine culture grew greater than 100,000 colonies Escherichia coli that was resistant to ciprofloxacin But sensitive to ceftriaxone.  At the time of admission the patient had acute renal failure that was felt to be related to dehydration with baseline chronic kidney disease, stage III.  The patient initially did well but developed acute respiratory insufficiency with hypoxemia on the morning of 03/23/2015.  Chest x-ray revealed some right lung opacities suggestive of possible aspiration pneumonia versus unilateral pulmonary edema. Troponin levels were markedly positive and the patient ruled in for an acute non-ST segment elevation myocardial infarction. The patient was started on intravenous heparin and cardiology consultation  was obtained. The patient improved clinically within less than 24 hours. Plans for cardiac catheterization were held until the patient's renal function stabilized near his baseline.  The patient had several melanotic stools while anticoagulated with heparin, aspirin and Plavix associated with a slight fall of hemoglobin from 11.6 on 03/22/2015 to 10.1 on 03/28/2015.  The patient underwent upper GI endoscopy by Dr. Watt Climes on 03/27/2015 that revealed Significant ulcerations in the duodenal bulb without signs of active bleeding, distal esophageal ulcers and narrowing, and a "tiny" hiatal hernia. There was no sign of active bleeding appreciated. The patient underwent elective diagnostic cardiac catheterization This afternoon by Dr. Martinique.  He was found have severe left main and three-vessel coronary artery disease.  Elective cardiothoracic surgical consultation was requested.  The patient is married and lives locally in [place] with his wife. He lives a somewhat sedentary lifestyle but reports that he has been able to get around reasonably well for the last several years. His physical mobility and lifestyle are limited by the presence of chronic peripheral neuropathy, particularly related to severe weakness involving the left lower extremity that dates back to an episode of rhabdomyolysis that occurred following a fall injury in 2009. The patient has mild chronic exertional shortness of breath and moderate chronic bilateral lower extremity edema. The patient has mild short-term memory cognitive impairment that reportedly does not limit him much. He still drives an automobile and does chores around the house. He ambulates using a cane and sometimes with a walker. He uses a wheelchair for a longer duration walks. He reportedly has been quite stable until his acute illness that began last week. He denies any previous history  of exertional chest pain or chest tightness. He denies any history of PND, orthopnea,  palpitations, dizzy spells, or syncope.  He chronically uses CPAP at night for sleeping.   Past Medical History  Diagnosis Date  . Rhabdomyolysis   . Memory loss   . Exogenous obesity   . Insulin dependent diabetes mellitus   . Peripheral neuropathy   . Venous insufficiency   . CAD (coronary artery disease)   . Hyperlipidemia   . Gout   . Stroke     L patietal with small scattered lacunar infarcts  . Left foot drop   . Hypertension   . History of nuclear stress test 07/2011    dipyridamole; fixed inferolateral defect, worse at stress than rest; no reversible ischemia; low risk scan   . OSA on CPAP     uses a cpap  . Heart attack   . Aortic stenosis, mild 11/14/2013  . Bilateral leg edema 05/21/2014  . Diabetic peripheral neuropathy associated with type 1 diabetes mellitus 11/14/2013  . Obesity (BMI 30-39.9) 11/14/2013  . Thrombocytopenia 03/21/2015  . Obstructive sleep apnea 03/21/2015  . Dyspnea on exertion 03/21/2015  . Diabetes 1.5, managed as type 1 02/04/2013  . Weakness generalized 03/21/2015  . Chronic diastolic congestive heart failure   . Chronic kidney disease (CKD), stage III (moderate)   . Dysphagia   . Peptic ulcer with hemorrhage 03/28/2015  . Left main coronary artery disease 03/28/2015    Past Surgical History  Procedure Laterality Date  . Cholecystectomy    . Sinus endo w/fusion    . Coronary angioplasty  10/13/1996  . Rotator cuff repair Right   . Transthoracic echocardiogram  08/08/2013    EF 55-60%, mild conc hypertrophy, grade 1 diastolic dysfunction; AV with mild stenosis; LA & RA mildly dilated  . Coronary angioplasty  09/21/1989    emergency PTCA  . Coronary angioplasty  10/13/1996    Multi-Link diagonal & OD stenting (Dr. Marella Chimes)  . Coronary angioplasty  12/03/1997    disease of mid DX-1 ~50% & in mid PLA & PDA (distal lesions) (Dr. Marella Chimes)   . Coronary angioplasty  10/14/1999    progression of disease distal PLA & PDA; progression of disease  prox RCA - moderate (Dr. Marella Chimes)   . Coronary angioplasty with stent placement  04/04/2004    4.0x80mm non-DES (thrombectomy via AngioJet) to RCA for high grade stenosis (Dr. Marella Chimes)  . Carotid doppler  03/2013    bilat bulb/prox ICAs - mild amount of fibrous plaque with no evidence of diameter reduction  . Back surgery  2002    lumbosacral  . Tonsillectomy    . Colonoscopy    . Carpal tunnel release Bilateral 08/09/2014    Procedure: BILATERAL CARPAL TUNNEL RELEASE;  Surgeon: Daryll Brod, MD;  Location: [name];  Service: Orthopedics;  Laterality: Bilateral;  ANESTHESIA:  IV REGIONAL BIL FAB  . Esophagogastroduodenoscopy N/A 03/27/2015    Procedure: ESOPHAGOGASTRODUODENOSCOPY (EGD);  Surgeon: Clarene Essex, MD;  Location: [facility] ENDOSCOPY;  Service: Endoscopy;  Laterality: N/A;  possible dilation    Family History  Problem Relation Age of Onset  . Coronary artery disease Father   . Heart disease Mother   . Cancer Maternal Grandmother   . Heart Problems Maternal Grandfather     History   Social History  . Marital Status: Married    Spouse Name: N/A  . Number of Children: 3  . Years of Education: 54  Occupational History  . Not on file.   Social History Main Topics  . Smoking status: Former Smoker -- 2.00 packs/day for 20 years    Types: Cigarettes    Quit date: 01/25/1973  . Smokeless tobacco: Never Used  . Alcohol Use: No  . Drug Use: No  . Sexual Activity: Not on file   Other Topics Concern  . Not on file   Social History Narrative    Prior to Admission medications   Medication Sig Start Date End Date Taking? Authorizing Provider  allopurinol (ZYLOPRIM) 100 MG tablet Take 300 mg by mouth daily.    Yes Historical Provider, MD  amitriptyline (ELAVIL) 50 MG tablet Take 50 mg by mouth at bedtime.   Yes Historical Provider, MD  amLODipine (NORVASC) 5 MG tablet Take 5 mg by mouth daily.   Yes Historical Provider, MD  aspirin EC 81 MG tablet Take 81  mg by mouth daily.   Yes Historical Provider, MD  clopidogrel (PLAVIX) 75 MG tablet Take 75 mg by mouth daily.   Yes Historical Provider, MD  colchicine 0.6 MG tablet Take 0.6 mg by mouth daily as needed.   Yes Historical Provider, MD  donepezil (ARICEPT) 10 MG tablet Take 1 tablet (10 mg total) by mouth daily. 09/10/14  Yes Marcial Pacas, MD  fish oil-omega-3 fatty acids 1000 MG capsule Take 1 g by mouth 2 (two) times daily.    Yes Historical Provider, MD  HYDROcodone-acetaminophen (VICODIN) 5-500 MG per tablet Take 2 tablets by mouth at bedtime.    Yes Historical Provider, MD  Insulin Glargine 300 UNIT/ML SOPN Inject 60 Units into the skin every morning.   Yes Historical Provider, MD  insulin lispro (HUMALOG) 100 UNIT/ML injection Inject 20-25 Units into the skin 4 (four) times daily. Takes 25u in am, 20u at lunch, 20u in evening and 25u at bedtime   Yes Historical Provider, MD  lisinopril (PRINIVIL,ZESTRIL) 5 MG tablet Take 10 mg by mouth daily.    Yes Historical Provider, MD  metoprolol succinate (TOPROL-XL) 25 MG 24 hr tablet Take 25 mg by mouth daily.   Yes Historical Provider, MD  NON FORMULARY Inhale 1 application into the lungs at bedtime. CPAP   Yes Historical Provider, MD  potassium chloride SA (K-DUR,KLOR-CON) 20 MEQ tablet Take 20 mEq by mouth daily.    Yes Historical Provider, MD  rosuvastatin (CRESTOR) 20 MG tablet Take 20 mg by mouth daily.   Yes Historical Provider, MD  Tamsulosin HCl (FLOMAX) 0.4 MG CAPS Take 0.4 mg by mouth daily.    Yes Historical Provider, MD  torsemide (DEMADEX) 10 MG tablet Take 50 mg by mouth daily.   Yes Historical Provider, MD    Current Facility-Administered Medications  Medication Dose Route Frequency Provider Last Rate Last Dose  . 0.9 %  sodium chloride infusion  1 mL/kg/hr Intravenous Continuous Peter M Martinique, MD 117.9 mL/hr at 03/28/15 1800 1 mL/kg/hr at 03/28/15 1800  . acetaminophen (TYLENOL) tablet 650 mg  650 mg Oral Q6H PRN Melton Alar, PA-C        Or  . acetaminophen (TYLENOL) suppository 650 mg  650 mg Rectal Q6H PRN Melton Alar, PA-C      . albuterol (PROVENTIL) (2.5 MG/3ML) 0.083% nebulizer solution 2.5 mg  2.5 mg Nebulization Q4H PRN Thurnell Lose, MD   2.5 mg at 03/23/15 0753  . alum & mag hydroxide-simeth (MAALOX/MYLANTA) 200-200-20 MG/5ML suspension 30 mL  30 mL Oral Q6H PRN Melton Alar,  PA-C      . amitriptyline (ELAVIL) tablet 50 mg  50 mg Oral QHS Melton Alar, PA-C   50 mg at 03/27/15 2155  . amLODipine (NORVASC) tablet 5 mg  5 mg Oral Daily Melton Alar, PA-C   5 mg at 03/28/15 0955  . donepezil (ARICEPT) tablet 10 mg  10 mg Oral Daily Melton Alar, PA-C   10 mg at 03/28/15 0957  . heparin ADULT infusion 100 units/mL (25000 units/250 mL)  1,650 Units/hr Intravenous Continuous Rebecka Apley, [facility]      . insulin aspart (novoLOG) injection 0-15 Units  0-15 Units Subcutaneous TID WC Thurnell Lose, MD   2 Units at 03/28/15 [phone]  . insulin aspart (novoLOG) injection 0-5 Units  0-5 Units Subcutaneous QHS Thurnell Lose, MD   0 Units at 03/25/15 2200  . insulin aspart (novoLOG) injection 4 Units  4 Units Subcutaneous TID WC Thurnell Lose, MD   4 Units at 03/28/15 [phone]  . insulin glargine (LANTUS) injection 20 Units  20 Units Subcutaneous BID Thurnell Lose, MD   20 Units at 03/28/15 [phone]  . metoprolol succinate (TOPROL-XL) 24 hr tablet 12.5 mg  12.5 mg Oral Daily Dayna N Dunn, PA-C   12.5 mg at 03/28/15 0955  . nitroGLYCERIN 50 mg in dextrose 5 % 250 mL (0.2 mg/mL) infusion  3 mcg/min Intravenous Titrated Peter M Martinique, MD 0.9 mL/hr at 03/28/15 1600 3 mcg/min at 03/28/15 1600  . omega-3 acid ethyl esters (LOVAZA) capsule 1 g  1 g Oral BID Caren Griffins, MD   1 g at 03/28/15 0956  . ondansetron (ZOFRAN) tablet 4 mg  4 mg Oral Q6H PRN Melton Alar, PA-C   4 mg at 03/22/15 1100   Or  . ondansetron (ZOFRAN) injection 4 mg  4 mg Intravenous Q6H PRN Melton Alar, PA-C      . oxyCODONE (Oxy  IR/ROXICODONE) immediate release tablet 5 mg  5 mg Oral Q4H PRN Melton Alar, PA-C      . [START ON 03/29/2015] pantoprazole (PROTONIX) injection 40 mg  40 mg Intravenous Q12H Thurnell Lose, MD      . piperacillin-tazobactam (ZOSYN) IVPB 3.375 g  3.375 g Intravenous 3 times per day Osborne Oman, RPH   3.375 g at 03/28/15 0509  . rosuvastatin (CRESTOR) tablet 20 mg  20 mg Oral Daily Melton Alar, PA-C   20 mg at 03/28/15 0957  . tamsulosin (FLOMAX) capsule 0.4 mg  0.4 mg Oral QHS Lauren D Bajbus, RPH   0.4 mg at 03/27/15 2155    Allergies  Allergen Reactions  . Actos [Pioglitazone] Swelling  . Metformin And Related Nausea Only  . Niaspan [Niacin Er] Itching and Rash      Review of Systems:   General:  normal appetite, decreased energy, no weight gain, no weight loss, no fever  Cardiac:  no chest pain with exertion, no chest pain at rest, + SOB with exertion, + resting SOB during this admission but not previously, no PND, no orthopnea, no palpitations, no arrhythmia, no atrial fibrillation, + LE edema, no dizzy spells, no syncope  Respiratory:  + shortness of breath, no home oxygen, no productive cough, no dry cough, no bronchitis, no wheezing, no hemoptysis, no asthma, no pain with inspiration or cough, + sleep apnea, + CPAP at night  GI:   + difficulty swallowing, + reflux, no frequent heartburn, + tiny hiatal hernia, no abdominal pain,  no constipation, + diarrhea, no hematochezia, no hematemesis, + melena  GU:   + dysuria,  + frequency, + urinary tract infection, no hematuria, + enlarged prostate, no kidney stones, + kidney disease  Vascular:  no pain suggestive of claudication, no pain in feet, no leg cramps, no varicose veins, no DVT, no non-healing foot ulcer  Neuro:   + stroke, no TIA's, no seizures, no headaches, no temporary blindness one eye,  no slurred speech, + peripheral neuropathy, no chronic pain, + instability of gait, + mild memory/cognitive  dysfunction  Musculoskeletal: no arthritis, no joint swelling, no myalgias, + difficulty walking, moderately limited mobility   Skin:   no rash, no itching, no skin infections, no pressure sores or ulcerations  Psych:   no anxiety, no depression, no nervousness, no unusual recent stress  Eyes:   no blurry vision, no floaters, no recent vision changes, + wears glasses or contacts  ENT:   no hearing loss, no loose or painful teeth  Hematologic:  + easy bruising, + abnormal bleeding, no clotting disorder, no frequent epistaxis  Endocrine:  + diabetes, routinely checks CBG's at home     Physical Exam:   BP 156/56 mmHg  Pulse 51  Temp(Src) 98.4 F (36.9 C) (Oral)  Resp 17  Ht 5\' 9"  (1.753 m)  Wt 117.9 kg (259 lb 14.8 oz)  BMI 38.37 kg/m2  SpO2 95%  General:  Obese but o/w well-appearing  HEENT:  Unremarkable   Neck:   no JVD, no bruits, no adenopathy   Chest:   clear to auscultation, symmetrical breath sounds, no wheezes, no rhonchi   CV:   RRR, grade II/VI systolic murmur   Abdomen:  soft, non-tender, no masses   Extremities:  warm, well-perfused, pulses not palpable at ankle, + bilateral lower extremity edema  Rectal/GU  Deferred  Neuro:   Grossly non-focal and symmetrical throughout  Skin:   Clean and dry, no rashes, no breakdown, + chronic changes both lower legs c/w diabetic microvascular disease and venous insufficiency    Diagnostic Tests:  Transthoracic Echocardiography  Patient:  Martinique, Jamill S MR #:    [id number] Study Date: 03/22/2015 Gender:   M Age:    23 Height:   175.3 cm Weight:   117 kg BSA:    2.44 m^2 Pt. Status: Room:    [place]  Hosie Poisson [id number] SONOGRAPHER [address] [place], [place] REFERRING  Carlos, Marianne L ADMITTING  [place], [place] M PERFORMING  [facility], Inpatient  cc:  ------------------------------------------------------------------- LV EF: 55% -   60%  ------------------------------------------------------------------- Indications:   Dyspnea 786.09.  ------------------------------------------------------------------- History:  PMH: Former Smoker, Asymptomatic PVC, Rhabdomyolysis. Dyspnea. Coronary artery disease. Stroke. Risk factors: Dyslipidemia.  ------------------------------------------------------------------- Study Conclusions  - Left ventricle: The cavity size was normal. Wall thickness was increased in a pattern of mild LVH. Systolic function was normal. The estimated ejection fraction was in the range of 55% to 60%. There is hypokinesis of the basalinferolateral myocardium. Doppler parameters are consistent with abnormal left ventricular relaxation (grade 1 diastolic dysfunction). Doppler parameters are consistent with high ventricular filling pressure. - Mitral valve: Calcified annulus.  Impressions:  - Mild hypokinesis of the basal inferolateral wall with overall normal LV function; grade 1 diastolic dysfunction with elevated LV filling pressure; calcified aortic valve; mean gradient of 18 mmHg suggests mild AS but visually valve opens well.  Transthoracic echocardiography. M-mode, complete 2D, spectral Doppler, and color Doppler. Birthdate: Patient birthdate: 1941/06/25. Age: Patient is  74 yr old. Sex: Gender: male. BMI: 38.1 kg/m^2. Blood pressure:   107/38 Patient status: Inpatient. Study date: Study date: 03/22/2015. Study time: 10:17 AM. Location: Bedside.  -------------------------------------------------------------------  ------------------------------------------------------------------- Left ventricle: The cavity size was normal. Wall thickness was increased in a pattern of mild LVH. Systolic function was normal. The estimated ejection fraction was in the range of 55% to 60%. Regional wall motion abnormalities:  There is hypokinesis of  the basalinferolateral myocardium. Doppler parameters are consistent with abnormal left ventricular relaxation (grade 1 diastolic dysfunction). Doppler parameters are consistent with high ventricular filling pressure.  ------------------------------------------------------------------- Aortic valve:  Trileaflet; moderately calcified leaflets. Mobility was not restricted. Doppler:  There was no stenosis.  There was no regurgitation.  VTI ratio of LVOT to aortic valve: 0.52. Indexed valve area (VTI): 0.67 cm^2/m^2. Indexed valve area (Vmax): 0.66 cm^2/m^2. Mean velocity ratio of LVOT to aortic valve: 0.51. Indexed valve area (Vmean): 0.65 cm^2/m^2.  Mean gradient (S): 16 mm Hg. Peak gradient (S): 29 mm Hg.  ------------------------------------------------------------------- Aorta: Aortic root: The aortic root was normal in size.  ------------------------------------------------------------------- Mitral valve:  Calcified annulus. Mobility was not restricted. Doppler: Transvalvular velocity was within the normal range. There was no evidence for stenosis. There was no regurgitation.  Peak gradient (D): 5 mm Hg.  ------------------------------------------------------------------- Left atrium: The atrium was normal in size.  ------------------------------------------------------------------- Right ventricle: The cavity size was normal. Systolic function was normal.  ------------------------------------------------------------------- Pulmonic valve:  Doppler: Transvalvular velocity was within the normal range. There was no evidence for stenosis.  ------------------------------------------------------------------- Tricuspid valve:  Structurally normal valve.  Doppler: Transvalvular velocity was within the normal range. There was trivial regurgitation.  ------------------------------------------------------------------- Right atrium: The atrium was normal in  size.  ------------------------------------------------------------------- Pericardium: There was no pericardial effusion.  ------------------------------------------------------------------- Systemic veins: Inferior vena cava: The vessel was normal in size.  ------------------------------------------------------------------- Measurements  Left ventricle              Value     Reference LV ID, ED, PLAX chordal          47.3 mm    43 - 52 LV ID, ES, PLAX chordal          31.9 mm    23 - 38 LV fx shortening, PLAX chordal      33  %    >=29 LV PW thickness, ED            10.2 mm    --------- IVS/LV PW ratio, ED            1.25      <=1.3 Stroke volume, 2D             88  ml    --------- Stroke volume/bsa, 2D           36  ml/m^2  --------- LV e&', lateral              7.35 cm/s   --------- LV E/e&', lateral             15.1      --------- LV e&', medial               4.61 cm/s   --------- LV E/e&', medial              24.08     --------- LV e&', average              5.98 cm/s   --------- LV E/e&', average  18.56     ---------  Ventricular septum            Value     Reference IVS thickness, ED             12.7 mm    ---------  LVOT                   Value     Reference LVOT ID, S                20  mm    --------- LVOT area                 3.14 cm^2   --------- LVOT peak velocity, S           138  cm/s   --------- LVOT mean velocity, S           94.5 cm/s   --------- LVOT VTI, S                28  cm    --------- LVOT peak gradient, S           8   mm Hg   ---------  Aortic valve               Value     Reference Aortic valve mean velocity, S       186  cm/s   --------- Aortic valve VTI, S            53.7 cm    --------- Aortic mean gradient, S          16  mm Hg  --------- Aortic peak gradient, S          29  mm Hg  --------- VTI ratio, LVOT/AV            0.52      --------- Aortic valve area/bsa, VTI        0.67 cm^2/m^2 --------- Aortic valve area/bsa, peak        0.66 cm^2/m^2 --------- velocity Velocity ratio, mean, LVOT/AV       0.51      --------- Aortic valve area/bsa, mean        0.65 cm^2/m^2 --------- velocity  Aorta                   Value     Reference Aortic root ID, ED            31  mm    ---------  Left atrium                Value     Reference LA ID, A-P, ES              39  mm    --------- LA ID/bsa, A-P              1.6  cm/m^2  <=2.2 LA volume, S               65  ml    --------- LA volume/bsa, S             26.7 ml/m^2  --------- LA volume, ES, 1-p A4C          56  ml    --------- LA volume/bsa, ES, 1-p A4C        23  ml/m^2  --------- LA volume, ES, 1-p A2C  77  ml    --------- LA volume/bsa, ES, 1-p A2C        31.6 ml/m^2  ---------  Mitral valve               Value     Reference Mitral E-wave peak velocity        111  cm/s   --------- Mitral A-wave peak velocity        107  cm/s   --------- Mitral deceleration time         208  ms    150 - 230 Mitral peak gradient, D          5   mm Hg  --------- Mitral E/A ratio, peak          1       ---------  Systemic veins               Value     Reference Estimated CVP               3   mm Hg  ---------  Legend: (L) and (H) mark values outside specified reference range.  ------------------------------------------------------------------- Prepared and Electronically Authenticated by  Kirk Ruths 2016-04-08T12:06:42    Cardiac Catheterization Procedure Note  Name: Demorio S Martinique MRN: [id number] DOB: 11-22-1941  Procedure: Left Heart Cath, Selective Coronary Angiography, LV angiography  Indication: 74 yo WM with history of CAD s/p remote stent of the RCA presents with a NSTEMI in the setting of a UTI.   Procedural Details: The right wrist was prepped, draped, and anesthetized with 1% lidocaine. Using the modified Seldinger technique, a 6 French slender sheath was introduced into the right radial artery. 3 mg of verapamil was administered through the sheath, weight-based unfractionated heparin was administered intravenously. Standard Judkins catheters were used for selective coronary angiography and left ventriculography. Catheter exchanges were performed over an exchange length guidewire. There were no immediate procedural complications. A TR band was used for radial hemostasis at the completion of the procedure. The patient was transferred to the post catheterization recovery area for further monitoring.  Procedural Findings: Hemodynamics: AO 137/50 mean 79 mm Hg LV 144/26 mm Hg   Coronary angiography: Coronary dominance: right  Left mainstem: 95% distal left main stenosis.  Left anterior descending (LAD): There is 95% ostial LAD stenosis. The LAD is a large vessel. The LAD and diagonal otherwise are without significant disease.  There is a large ramus intermediate branch with 99% ostial stenosis.  Left circumflex (LCx): 100% ostial occlusion with faint left to left collaterals.  Right coronary artery (RCA): the RCA is a large dominant branch.  The stent in the proximal vessel is widely patent. There is a 60% stenosis in the mid PDA. The third PLOM is moderate in size with a long 90-95% stenosis prior to distal arborization.   Left ventriculography: Not done.  Final Conclusions:  1. Critical left main and three vessel obstructive CAD 2. Elevated LVEDP.  Recommendations: will transfer to ICU. Patient is without pain and hemodynamically stable. Will start low dose IV Ntg. Resume IV heparin in 8 hours. Plavix will be held. Gentle diuresis. CT surgery consult called.   Peter Martinique, [place]  03/28/2015, 3:05 PM    Impression:  Patient has critical left main coronary artery stenosis and three-vessel coronary artery disease. He suffered an acute non-ST segment elevation myocardial infarction several days ago after having originally been admitted to the hospital with acute urinary tract infection complicated by confusion  and a fall. The patient has been chronically anticoagulated using Plavix for many years and additionally developed melanotic stools while anticoagulated with heparin earlier this week. Upper GI endoscopy confirmed the presence of active peptic ulcer disease without signs of ongoing bleeding. I have personally reviewed the patient's transthoracic echocardiogram and diagnostic cardiac catheterization films. Transthoracic echocardiogram revealed essentially normal left ventricular systolic function, although it was obtained prior to the patient's acute myocardial infarction.  The patient also has aortic valve sclerosis with perhaps mild aortic stenosis. However, direct visual examination of the echocardiogram reveals that all 3 leaflets of the aortic valve move reasonably well. The velocity measured across the left ventricular outflow tract was elevated and the dimensionless velocity ratio between the left ventricular outflow tract and aortic valve was quite high.  As a result, I do not feel the patient has significant aortic  stenosis although TEE might be helpful.  Cardiac catheterization confirms the presence of critical stenosis of the distal left main coronary artery. The patient has right dominant coronary circulation with a widely patent stent in the right coronary artery. There is otherwise only moderate non-obstructive disease in the right coronary system with exception of the terminal posterolateral branch of the right coronary artery, which is subtotally chronically occluded.  This vessel may not be graftable at the time of bypass surgery.  Options include both high-risk coronary artery bypass grafting and PCI with stenting of the left main coronary artery.  Despite the patient's numerous chronic comorbid medical problems and complex circumstances with multiple acute complications over the past week, I suspect the patient would probably best be treated with coronary artery bypass grafting.  PCI and stenting of the left main coronary artery would probably be feasible, but long-term durability might be problematic and the patient may have difficulties with dual antiplatelet therapy given the recent development of upper GI bleeding. I'm concerned about the patient's ability to recover from open heart surgery, but under the circumstances surgery is probably still the best option.    Plan:  I have reviewed the indications, risks, and potential benefits of coronary artery bypass grafting with the patient and his wife and son.  Alternative treatment strategies have been discussed.  The complicated circumstances associated with his current medical problems were reviewed at length. The high-risk associated with surgery were discussed.  The patient understands and accepts all potential associated risks of surgery including but not limited to risk of death, stroke or other neurologic complication, myocardial infarction, congestive heart failure, respiratory failure, renal failure, bleeding requiring blood transfusion and/or  reexploration, aortic dissection or other major vascular complication, arrhythmia, heart block or bradycardia requiring permanent pacemaker, GI bleeding, pneumonia, pleural effusion, wound infection, pulmonary embolus or other thromboembolic complication, chronic pain or other delayed complications related to median sternotomy, or the late recurrence of symptomatic ischemic heart disease and/or congestive heart failure.  We will check a Plavix platelet inhibition assay and continue to monitor the patient very closely.  All questions answered.   I spent in excess of 120 minutes during the conduct of this hospital consultation and >50% of this time involved direct face-to-face encounter for counseling and/or coordination of the patient's care.   Valentina Gu. Roxy Manns, MD 03/28/2015 7:24 PM

## 2015-03-29 ENCOUNTER — Other Ambulatory Visit: Payer: Self-pay | Admitting: *Deleted

## 2015-03-29 ENCOUNTER — Inpatient Hospital Stay (HOSPITAL_COMMUNITY): Payer: Medicare Other | Admitting: Anesthesiology

## 2015-03-29 ENCOUNTER — Encounter (HOSPITAL_COMMUNITY): Payer: Medicare Other

## 2015-03-29 ENCOUNTER — Encounter (HOSPITAL_COMMUNITY)
Admission: EM | Disposition: A | Discharge status: Home / Self Care | Payer: Medicare Other | Source: Home / Self Care | Attending: Internal Medicine

## 2015-03-29 ENCOUNTER — Encounter (HOSPITAL_COMMUNITY): Payer: Self-pay | Admitting: Thoracic Surgery (Cardiothoracic Vascular Surgery)

## 2015-03-29 ENCOUNTER — Inpatient Hospital Stay (HOSPITAL_COMMUNITY): Payer: Medicare Other

## 2015-03-29 DIAGNOSIS — I2510 Atherosclerotic heart disease of native coronary artery without angina pectoris: Secondary | ICD-10-CM

## 2015-03-29 DIAGNOSIS — I350 Nonrheumatic aortic (valve) stenosis: Secondary | ICD-10-CM

## 2015-03-29 DIAGNOSIS — Z951 Presence of aortocoronary bypass graft: Secondary | ICD-10-CM

## 2015-03-29 DIAGNOSIS — Z01810 Encounter for preprocedural cardiovascular examination: Secondary | ICD-10-CM

## 2015-03-29 DIAGNOSIS — I358 Other nonrheumatic aortic valve disorders: Secondary | ICD-10-CM

## 2015-03-29 HISTORY — DX: Other nonrheumatic aortic valve disorders: I35.8

## 2015-03-29 HISTORY — DX: Presence of aortocoronary bypass graft: Z95.1

## 2015-03-29 HISTORY — PX: CORONARY ARTERY BYPASS GRAFT: SHX141

## 2015-03-29 LAB — POCT I-STAT 3, ART BLOOD GAS (G3+)
Acid-Base Excess: 2 mmol/L (ref 0.0–2.0)
Acid-base deficit: 2 mmol/L (ref 0.0–2.0)
Bicarbonate: 26.6 mEq/L — ABNORMAL HIGH (ref 20.0–24.0)
Bicarbonate: 24 mEq/L (ref 20.0–24.0)
Bicarbonate: 22.9 mEq/L (ref 20.0–24.0)
O2 Saturation: 98 %
O2 Saturation: 100 %
O2 Saturation: 95 %
Patient temperature: 98.4
Patient temperature: 36.3
TCO2: 28 mmol/L (ref 0–100)
TCO2: 25 mmol/L (ref 0–100)
TCO2: 24 mmol/L (ref 0–100)
pCO2 arterial: 38.9 mmHg (ref 35.0–45.0)
pCO2 arterial: 37.1 mmHg (ref 35.0–45.0)
pCO2 arterial: 36.9 mmHg (ref 35.0–45.0)
pH, Arterial: 7.442 (ref 7.350–7.450)
pH, Arterial: 7.418 (ref 7.350–7.450)
pH, Arterial: 7.397 (ref 7.350–7.450)
pO2, Arterial: 92 mmHg (ref 80.0–100.0)
pO2, Arterial: 323 mmHg — ABNORMAL HIGH (ref 80.0–100.0)
pO2, Arterial: 70 mmHg — ABNORMAL LOW (ref 80.0–100.0)

## 2015-03-29 LAB — CBC
HCT: 30.1 % — ABNORMAL LOW (ref 39.0–52.0)
HCT: 28.5 % — ABNORMAL LOW (ref 39.0–52.0)
Hemoglobin: 10 g/dL — ABNORMAL LOW (ref 13.0–17.0)
Hemoglobin: 9.8 g/dL — ABNORMAL LOW (ref 13.0–17.0)
MCH: 29.6 pg (ref 26.0–34.0)
MCH: 30.8 pg (ref 26.0–34.0)
MCHC: 33.2 g/dL (ref 30.0–36.0)
MCHC: 34.4 g/dL (ref 30.0–36.0)
MCV: 89.1 fL (ref 78.0–100.0)
MCV: 89.6 fL (ref 78.0–100.0)
Platelets: 189 10*3/uL (ref 150–400)
Platelets: 159 10*3/uL (ref 150–400)
RBC: 3.38 MIL/uL — ABNORMAL LOW (ref 4.22–5.81)
RBC: 3.18 MIL/uL — ABNORMAL LOW (ref 4.22–5.81)
RDW: 15.2 % (ref 11.5–15.5)
RDW: 14.9 % (ref 11.5–15.5)
WBC: 10 10*3/uL (ref 4.0–10.5)
WBC: 16.8 10*3/uL — ABNORMAL HIGH (ref 4.0–10.5)

## 2015-03-29 LAB — POCT I-STAT, CHEM 8
BUN: 15 mg/dL (ref 6–23)
BUN: 13 mg/dL (ref 6–23)
BUN: 14 mg/dL (ref 6–23)
BUN: 13 mg/dL (ref 6–23)
BUN: 14 mg/dL (ref 6–23)
Calcium, Ion: 1.19 mmol/L (ref 1.13–1.30)
Calcium, Ion: 1.03 mmol/L — ABNORMAL LOW (ref 1.13–1.30)
Calcium, Ion: 1.19 mmol/L (ref 1.13–1.30)
Calcium, Ion: 1.04 mmol/L — ABNORMAL LOW (ref 1.13–1.30)
Calcium, Ion: 1.1 mmol/L — ABNORMAL LOW (ref 1.13–1.30)
Chloride: 105 mmol/L (ref 96–112)
Chloride: 104 mmol/L (ref 96–112)
Chloride: 106 mmol/L (ref 96–112)
Chloride: 103 mmol/L (ref 96–112)
Chloride: 104 mmol/L (ref 96–112)
Creatinine, Ser: 1.2 mg/dL (ref 0.50–1.35)
Creatinine, Ser: 1 mg/dL (ref 0.50–1.35)
Creatinine, Ser: 1.2 mg/dL (ref 0.50–1.35)
Creatinine, Ser: 1.1 mg/dL (ref 0.50–1.35)
Creatinine, Ser: 1.2 mg/dL (ref 0.50–1.35)
Glucose, Bld: 113 mg/dL — ABNORMAL HIGH (ref 70–99)
Glucose, Bld: 111 mg/dL — ABNORMAL HIGH (ref 70–99)
Glucose, Bld: 121 mg/dL — ABNORMAL HIGH (ref 70–99)
Glucose, Bld: 136 mg/dL — ABNORMAL HIGH (ref 70–99)
Glucose, Bld: 143 mg/dL — ABNORMAL HIGH (ref 70–99)
HCT: 27 % — ABNORMAL LOW (ref 39.0–52.0)
HCT: 19 % — ABNORMAL LOW (ref 39.0–52.0)
HCT: 25 % — ABNORMAL LOW (ref 39.0–52.0)
HCT: 24 % — ABNORMAL LOW (ref 39.0–52.0)
HCT: 24 % — ABNORMAL LOW (ref 39.0–52.0)
Hemoglobin: 9.2 g/dL — ABNORMAL LOW (ref 13.0–17.0)
Hemoglobin: 6.5 g/dL — CL (ref 13.0–17.0)
Hemoglobin: 8.5 g/dL — ABNORMAL LOW (ref 13.0–17.0)
Hemoglobin: 8.2 g/dL — ABNORMAL LOW (ref 13.0–17.0)
Hemoglobin: 8.2 g/dL — ABNORMAL LOW (ref 13.0–17.0)
Potassium: 4.1 mmol/L (ref 3.5–5.1)
Potassium: 3.6 mmol/L (ref 3.5–5.1)
Potassium: 5.2 mmol/L — ABNORMAL HIGH (ref 3.5–5.1)
Potassium: 4.3 mmol/L (ref 3.5–5.1)
Potassium: 4.9 mmol/L (ref 3.5–5.1)
Sodium: 142 mmol/L (ref 135–145)
Sodium: 139 mmol/L (ref 135–145)
Sodium: 141 mmol/L (ref 135–145)
Sodium: 140 mmol/L (ref 135–145)
Sodium: 140 mmol/L (ref 135–145)
TCO2: 22 mmol/L (ref 0–100)
TCO2: 20 mmol/L (ref 0–100)
TCO2: 22 mmol/L (ref 0–100)
TCO2: 19 mmol/L (ref 0–100)
TCO2: 21 mmol/L (ref 0–100)

## 2015-03-29 LAB — CULTURE, BLOOD (ROUTINE X 2)
Culture: NO GROWTH
Culture: NO GROWTH
Culture: NO GROWTH
Culture: NO GROWTH

## 2015-03-29 LAB — GLUCOSE, CAPILLARY
Glucose-Capillary: 105 mg/dL — ABNORMAL HIGH (ref 70–99)
Glucose-Capillary: 107 mg/dL — ABNORMAL HIGH (ref 70–99)
Glucose-Capillary: 108 mg/dL — ABNORMAL HIGH (ref 70–99)
Glucose-Capillary: 109 mg/dL — ABNORMAL HIGH (ref 70–99)
Glucose-Capillary: 111 mg/dL — ABNORMAL HIGH (ref 70–99)
Glucose-Capillary: 112 mg/dL — ABNORMAL HIGH (ref 70–99)
Glucose-Capillary: 90 mg/dL (ref 70–99)

## 2015-03-29 LAB — COMPREHENSIVE METABOLIC PANEL
ALT: 29 U/L (ref 0–53)
AST: 29 U/L (ref 0–37)
Albumin: 2.4 g/dL — ABNORMAL LOW (ref 3.5–5.2)
Alkaline Phosphatase: 62 U/L (ref 39–117)
Anion gap: 11 (ref 5–15)
BUN: 15 mg/dL (ref 6–23)
CO2: 24 mmol/L (ref 19–32)
Calcium: 8.2 mg/dL — ABNORMAL LOW (ref 8.4–10.5)
Chloride: 106 mmol/L (ref 96–112)
Creatinine, Ser: 1.3 mg/dL (ref 0.50–1.35)
GFR calc Af Amer: 61 mL/min — ABNORMAL LOW (ref >90)
GFR calc non Af Amer: 52 mL/min — ABNORMAL LOW (ref >90)
Glucose, Bld: 104 mg/dL — ABNORMAL HIGH (ref 70–99)
Potassium: 3.2 mmol/L — ABNORMAL LOW (ref 3.5–5.1)
Sodium: 141 mmol/L (ref 135–145)
Total Bilirubin: 0.7 mg/dL (ref 0.3–1.2)
Total Protein: 5.3 g/dL — ABNORMAL LOW (ref 6.0–8.3)

## 2015-03-29 LAB — SURGICAL PCR SCREEN
MRSA, PCR: NEGATIVE
Staphylococcus aureus: POSITIVE — AB

## 2015-03-29 LAB — PREPARE RBC (CROSSMATCH)

## 2015-03-29 LAB — POCT I-STAT 4, (NA,K, GLUC, HGB,HCT)
Glucose, Bld: 128 mg/dL — ABNORMAL HIGH (ref 70–99)
HCT: 34 % — ABNORMAL LOW (ref 39.0–52.0)
Hemoglobin: 11.6 g/dL — ABNORMAL LOW (ref 13.0–17.0)
Potassium: 3.5 mmol/L (ref 3.5–5.1)
Sodium: 137 mmol/L (ref 135–145)

## 2015-03-29 LAB — PROTIME-INR
INR: 1.16 (ref 0.00–1.49)
INR: 1.33 (ref 0.00–1.49)
Prothrombin Time: 15 seconds (ref 11.6–15.2)
Prothrombin Time: 16.6 seconds — ABNORMAL HIGH (ref 11.6–15.2)

## 2015-03-29 LAB — APTT
aPTT: 51 seconds — ABNORMAL HIGH (ref 24–37)
aPTT: 36 seconds (ref 24–37)

## 2015-03-29 LAB — HEPARIN LEVEL (UNFRACTIONATED): Heparin Unfractionated: 0.12 IU/mL — ABNORMAL LOW (ref 0.30–0.70)

## 2015-03-29 LAB — HEMOGLOBIN AND HEMATOCRIT, BLOOD
HCT: 24.4 % — ABNORMAL LOW (ref 39.0–52.0)
Hemoglobin: 8.3 g/dL — ABNORMAL LOW (ref 13.0–17.0)

## 2015-03-29 LAB — PLATELET COUNT: Platelets: 188 10*3/uL (ref 150–400)

## 2015-03-29 LAB — PLATELET INHIBITION P2Y12

## 2015-03-29 SURGERY — CORONARY ARTERY BYPASS GRAFTING (CABG)
Anesthesia: General | Site: Chest

## 2015-03-29 MED ORDER — METOPROLOL TARTRATE 25 MG/10 ML ORAL SUSPENSION
12.5000 mg | Freq: Two times a day (BID) | ORAL | Status: DC
  Administered 2015-03-29: 12.5 mg
  Filled 2015-03-29 (×3): qty 5

## 2015-03-29 MED ORDER — MIDAZOLAM HCL 2 MG/2ML IJ SOLN: 2.0000 mg | INTRAMUSCULAR | Status: DC | PRN

## 2015-03-29 MED ORDER — CEFTRIAXONE SODIUM IN DEXTROSE 20 MG/ML IV SOLN
1.0000 g | INTRAVENOUS | Status: DC
  Administered 2015-03-31: 1 g via INTRAVENOUS
  Filled 2015-03-29 (×2): qty 50

## 2015-03-29 MED ORDER — FAMOTIDINE IN NACL 20-0.9 MG/50ML-% IV SOLN
20.0000 mg | Freq: Two times a day (BID) | INTRAVENOUS | Status: AC
  Administered 2015-03-29 – 2015-03-30 (×2): 20 mg via INTRAVENOUS
  Filled 2015-03-29: qty 50

## 2015-03-29 MED ORDER — FENTANYL CITRATE (PF) 100 MCG/2ML IJ SOLN
50.0000 ug | Freq: Once | INTRAMUSCULAR | Status: AC
  Administered 2015-03-29: 50 ug via INTRAVENOUS

## 2015-03-29 MED ORDER — VANCOMYCIN HCL 10 G IV SOLR
1500.0000 mg | INTRAVENOUS | Status: DC
  Filled 2015-03-29: qty 1500

## 2015-03-29 MED ORDER — MORPHINE SULFATE 2 MG/ML IJ SOLN: 1.0000 mg | INTRAMUSCULAR | Status: AC | PRN

## 2015-03-29 MED ORDER — LACTATED RINGERS IV SOLN: INTRAVENOUS | Status: DC

## 2015-03-29 MED ORDER — INSULIN REGULAR BOLUS VIA INFUSION
0.0000 [IU] | Freq: Three times a day (TID) | INTRAVENOUS | Status: DC
  Filled 2015-03-29: qty 10

## 2015-03-29 MED ORDER — ROCURONIUM BROMIDE 100 MG/10ML IV SOLN
INTRAVENOUS | Status: DC | PRN
  Administered 2015-03-29 (×3): 50 mg via INTRAVENOUS

## 2015-03-29 MED ORDER — NITROGLYCERIN IN D5W 200-5 MCG/ML-% IV SOLN: 0.0000 ug/min | INTRAVENOUS | Status: DC

## 2015-03-29 MED ORDER — SODIUM CHLORIDE 0.9 % IV SOLN
INTRAVENOUS | Status: DC | PRN
  Administered 2015-03-29: 18:00:00 via INTRAVENOUS

## 2015-03-29 MED ORDER — VANCOMYCIN HCL 1000 MG IV SOLR
INTRAVENOUS | Status: DC
  Filled 2015-03-29: qty 1000

## 2015-03-29 MED ORDER — HEPARIN SODIUM (PORCINE) 1000 UNIT/ML IJ SOLN
INTRAMUSCULAR | Status: DC | PRN
  Administered 2015-03-29: 35000 [IU] via INTRAVENOUS

## 2015-03-29 MED ORDER — METOPROLOL TARTRATE 1 MG/ML IV SOLN
2.5000 mg | INTRAVENOUS | Status: DC | PRN
Start: 2015-03-29 — End: 2015-04-03
  Administered 2015-03-30: 5 mg via INTRAVENOUS

## 2015-03-29 MED ORDER — SODIUM CHLORIDE 0.9 % IV SOLN
INTRAVENOUS | Status: DC
  Administered 2015-03-29: 70 mL/h via INTRAVENOUS
  Filled 2015-03-29: qty 40

## 2015-03-29 MED ORDER — POTASSIUM CHLORIDE 10 MEQ/100ML IV SOLN
10.0000 meq | INTRAVENOUS | Status: AC
  Administered 2015-03-29 (×3): 10 meq via INTRAVENOUS
  Filled 2015-03-29 (×2): qty 100

## 2015-03-29 MED ORDER — LIDOCAINE HCL (CARDIAC) 20 MG/ML IV SOLN
INTRAVENOUS | Status: DC | PRN
  Administered 2015-03-29: 80 mg via INTRAVENOUS

## 2015-03-29 MED ORDER — MORPHINE SULFATE 2 MG/ML IJ SOLN
2.0000 mg | INTRAMUSCULAR | Status: DC | PRN
  Administered 2015-03-30: 2 mg via INTRAVENOUS
  Administered 2015-03-30: 4 mg via INTRAVENOUS
  Filled 2015-03-29: qty 1
  Filled 2015-03-29: qty 2
  Filled 2015-03-29: qty 1

## 2015-03-29 MED ORDER — HEMOSTATIC AGENTS (NO CHARGE) OPTIME
TOPICAL | Status: DC | PRN
  Administered 2015-03-29: 3 via TOPICAL

## 2015-03-29 MED ORDER — MUPIROCIN 2 % EX OINT
1.0000 "application " | TOPICAL_OINTMENT | Freq: Two times a day (BID) | CUTANEOUS | Status: DC
  Administered 2015-03-29: 1 via NASAL
  Filled 2015-03-29: qty 22

## 2015-03-29 MED ORDER — PROTAMINE SULFATE 10 MG/ML IV SOLN
INTRAVENOUS | Status: AC
  Filled 2015-03-29: qty 25

## 2015-03-29 MED ORDER — SODIUM CHLORIDE 0.9 % IJ SOLN: 3.0000 mL | INTRAMUSCULAR | Status: DC | PRN

## 2015-03-29 MED ORDER — SODIUM CHLORIDE 0.9 % IV SOLN
1500.0000 mg | INTRAVENOUS | Status: DC
  Administered 2015-03-29: 1500 mg via INTRAVENOUS
  Filled 2015-03-29: qty 1500

## 2015-03-29 MED ORDER — PLASMA-LYTE 148 IV SOLN
INTRAVENOUS | Status: AC
  Administered 2015-03-29: 500 mL
  Filled 2015-03-29: qty 2.5

## 2015-03-29 MED ORDER — FENTANYL CITRATE (PF) 250 MCG/5ML IJ SOLN
INTRAMUSCULAR | Status: AC
  Filled 2015-03-29: qty 5

## 2015-03-29 MED ORDER — HEPARIN SODIUM (PORCINE) 1000 UNIT/ML IJ SOLN
INTRAMUSCULAR | Status: AC
  Filled 2015-03-29: qty 1

## 2015-03-29 MED ORDER — SODIUM CHLORIDE 0.9 % IV SOLN: 250.0000 mL | INTRAVENOUS | Status: DC

## 2015-03-29 MED ORDER — SODIUM CHLORIDE 0.9 % IJ SOLN
OROMUCOSAL | Status: DC | PRN
  Administered 2015-03-29: 4 mL via TOPICAL

## 2015-03-29 MED ORDER — ONDANSETRON HCL 4 MG/2ML IJ SOLN: 4.0000 mg | Freq: Four times a day (QID) | INTRAMUSCULAR | Status: DC | PRN

## 2015-03-29 MED ORDER — POTASSIUM CHLORIDE 10 MEQ/50ML IV SOLN
10.0000 meq | Freq: Once | INTRAVENOUS | Status: AC
  Administered 2015-03-29: 10 meq via INTRAVENOUS

## 2015-03-29 MED ORDER — OXYCODONE HCL 5 MG PO TABS
5.0000 mg | ORAL_TABLET | ORAL | Status: DC | PRN
  Administered 2015-03-30 – 2015-04-01 (×2): 10 mg via ORAL
  Filled 2015-03-29 (×2): qty 2

## 2015-03-29 MED ORDER — MIDAZOLAM HCL 2 MG/2ML IJ SOLN
INTRAMUSCULAR | Status: AC
  Filled 2015-03-29: qty 2

## 2015-03-29 MED ORDER — MIDAZOLAM HCL 2 MG/2ML IJ SOLN: 1.0000 mg | Freq: Once | INTRAMUSCULAR | Status: DC

## 2015-03-29 MED ORDER — PROTAMINE SULFATE 10 MG/ML IV SOLN
INTRAVENOUS | Status: DC | PRN
  Administered 2015-03-29: 300 mg via INTRAVENOUS

## 2015-03-29 MED ORDER — ASPIRIN EC 325 MG PO TBEC
325.0000 mg | DELAYED_RELEASE_TABLET | Freq: Every day | ORAL | Status: DC
  Administered 2015-03-30 – 2015-04-03 (×5): 325 mg via ORAL
  Filled 2015-03-29 (×5): qty 1

## 2015-03-29 MED ORDER — NITROGLYCERIN IN D5W 200-5 MCG/ML-% IV SOLN
2.0000 ug/min | INTRAVENOUS | Status: DC
  Filled 2015-03-29: qty 250

## 2015-03-29 MED ORDER — PHENYLEPHRINE HCL 10 MG/ML IJ SOLN
10.0000 mg | INTRAVENOUS | Status: DC | PRN
  Administered 2015-03-29: 10 ug/min via INTRAVENOUS

## 2015-03-29 MED ORDER — PLASMA-LYTE 148 IV SOLN
INTRAVENOUS | Status: DC
  Filled 2015-03-29: qty 2.5

## 2015-03-29 MED ORDER — FENTANYL CITRATE (PF) 100 MCG/2ML IJ SOLN
INTRAMUSCULAR | Status: AC
  Filled 2015-03-29: qty 2

## 2015-03-29 MED ORDER — EPHEDRINE SULFATE 50 MG/ML IJ SOLN
INTRAMUSCULAR | Status: DC | PRN
  Administered 2015-03-29: 10 mg via INTRAVENOUS
  Administered 2015-03-29: 5 mg via INTRAVENOUS
  Administered 2015-03-29: 10 mg via INTRAVENOUS

## 2015-03-29 MED ORDER — PHENYLEPHRINE HCL 10 MG/ML IJ SOLN
0.0000 ug/min | INTRAVENOUS | Status: DC
  Filled 2015-03-29: qty 2

## 2015-03-29 MED ORDER — VANCOMYCIN HCL IN DEXTROSE 1-5 GM/200ML-% IV SOLN
1000.0000 mg | Freq: Once | INTRAVENOUS | Status: AC
  Administered 2015-03-30: 1000 mg via INTRAVENOUS
  Filled 2015-03-29: qty 200

## 2015-03-29 MED ORDER — THROMBIN 20000 UNITS EX SOLR
CUTANEOUS | Status: AC
  Filled 2015-03-29: qty 20000

## 2015-03-29 MED ORDER — SODIUM CHLORIDE 0.9 % IV SOLN
INTRAVENOUS | Status: DC
  Filled 2015-03-29: qty 40

## 2015-03-29 MED ORDER — DEXTROSE 5 % IV SOLN
1.5000 g | INTRAVENOUS | Status: DC
  Administered 2015-03-29: 1.5 g via INTRAVENOUS
  Administered 2015-03-29: .75 g via INTRAVENOUS
  Filled 2015-03-29: qty 1.5

## 2015-03-29 MED ORDER — MIDAZOLAM HCL 5 MG/5ML IJ SOLN
INTRAMUSCULAR | Status: DC | PRN
  Administered 2015-03-29 (×4): 2 mg via INTRAVENOUS
  Administered 2015-03-29: 1 mg via INTRAVENOUS
  Administered 2015-03-29: 3 mg via INTRAVENOUS

## 2015-03-29 MED ORDER — HEPARIN SODIUM (PORCINE) 1000 UNIT/ML IJ SOLN
INTRAMUSCULAR | Status: DC
  Filled 2015-03-29: qty 30

## 2015-03-29 MED ORDER — POTASSIUM CHLORIDE 10 MEQ/50ML IV SOLN
10.0000 meq | INTRAVENOUS | Status: AC
  Administered 2015-03-29 (×3): 10 meq via INTRAVENOUS

## 2015-03-29 MED ORDER — BISACODYL 10 MG RE SUPP: 10.0000 mg | Freq: Every day | RECTAL | Status: DC

## 2015-03-29 MED ORDER — PROPOFOL 10 MG/ML IV BOLUS
INTRAVENOUS | Status: DC | PRN
  Administered 2015-03-29: 160 mg via INTRAVENOUS

## 2015-03-29 MED ORDER — DEXTROSE 5 % IV SOLN
INTRAVENOUS | Status: DC
  Administered 2015-03-29: 50 mL via INTRAVENOUS

## 2015-03-29 MED ORDER — METOPROLOL TARTRATE 12.5 MG HALF TABLET
12.5000 mg | ORAL_TABLET | Freq: Two times a day (BID) | ORAL | Status: DC
Start: 2015-03-29 — End: 2015-03-30
  Administered 2015-03-30: 12.5 mg via ORAL
  Filled 2015-03-29 (×3): qty 1

## 2015-03-29 MED ORDER — LACTATED RINGERS IV SOLN
INTRAVENOUS | Status: DC | PRN
  Administered 2015-03-29 (×2): via INTRAVENOUS

## 2015-03-29 MED ORDER — LACTATED RINGERS IV SOLN
Freq: Once | INTRAVENOUS | Status: AC
  Administered 2015-03-29: 12:00:00 via INTRAVENOUS

## 2015-03-29 MED ORDER — ROCURONIUM BROMIDE 50 MG/5ML IV SOLN
INTRAVENOUS | Status: AC
  Filled 2015-03-29: qty 2

## 2015-03-29 MED ORDER — MAGNESIUM SULFATE 4 GM/100ML IV SOLN
4.0000 g | Freq: Once | INTRAVENOUS | Status: AC
  Administered 2015-03-29: 4 g via INTRAVENOUS
  Filled 2015-03-29: qty 100

## 2015-03-29 MED ORDER — SODIUM CHLORIDE 0.45 % IV SOLN
INTRAVENOUS | Status: DC | PRN
  Administered 2015-03-29: 19:00:00 via INTRAVENOUS

## 2015-03-29 MED ORDER — SODIUM CHLORIDE 0.9 % IJ SOLN
3.0000 mL | Freq: Two times a day (BID) | INTRAMUSCULAR | Status: DC
  Administered 2015-03-30 – 2015-03-31 (×4): 3 mL via INTRAVENOUS

## 2015-03-29 MED ORDER — DEXMEDETOMIDINE HCL IN NACL 200 MCG/50ML IV SOLN
0.0000 ug/kg/h | INTRAVENOUS | Status: DC
Start: 2015-03-29 — End: 2015-04-01
  Administered 2015-03-29: 0.4 ug/kg/h via INTRAVENOUS
  Administered 2015-03-29: 0.7 ug/kg/h via INTRAVENOUS
  Filled 2015-03-29 (×2): qty 50

## 2015-03-29 MED ORDER — SUCCINYLCHOLINE CHLORIDE 20 MG/ML IJ SOLN
INTRAMUSCULAR | Status: DC | PRN
  Administered 2015-03-29: 140 mg via INTRAVENOUS

## 2015-03-29 MED ORDER — DEXTROSE 5 % IV SOLN
750.0000 mg | INTRAVENOUS | Status: DC
  Filled 2015-03-29: qty 750

## 2015-03-29 MED ORDER — THROMBIN 20000 UNITS EX SOLR
OROMUCOSAL | Status: DC | PRN
  Administered 2015-03-29: 18:00:00 via TOPICAL

## 2015-03-29 MED ORDER — POTASSIUM CHLORIDE 2 MEQ/ML IV SOLN
80.0000 meq | INTRAVENOUS | Status: DC
Start: 2015-03-29 — End: 2015-03-29
  Filled 2015-03-29: qty 40

## 2015-03-29 MED ORDER — SODIUM CHLORIDE 0.9 % IV SOLN
Freq: Once | INTRAVENOUS | Status: AC
  Administered 2015-03-29: 21:00:00 via INTRAVENOUS

## 2015-03-29 MED ORDER — BISACODYL 5 MG PO TBEC
10.0000 mg | DELAYED_RELEASE_TABLET | Freq: Every day | ORAL | Status: DC
  Administered 2015-03-30 – 2015-04-01 (×3): 10 mg via ORAL
  Filled 2015-03-29 (×4): qty 2

## 2015-03-29 MED ORDER — ACETAMINOPHEN 160 MG/5ML PO SOLN
1000.0000 mg | Freq: Four times a day (QID) | ORAL | Status: DC
Start: 2015-03-30 — End: 2015-04-01

## 2015-03-29 MED ORDER — POTASSIUM CHLORIDE 2 MEQ/ML IV SOLN
80.0000 meq | INTRAVENOUS | Status: DC
  Filled 2015-03-29: qty 40

## 2015-03-29 MED ORDER — MIDAZOLAM HCL 10 MG/2ML IJ SOLN
INTRAMUSCULAR | Status: AC
  Filled 2015-03-29: qty 2

## 2015-03-29 MED ORDER — SODIUM CHLORIDE 0.9 % IV SOLN
INTRAVENOUS | Status: DC
  Filled 2015-03-29: qty 2.5

## 2015-03-29 MED ORDER — PANTOPRAZOLE SODIUM 40 MG PO TBEC
40.0000 mg | DELAYED_RELEASE_TABLET | Freq: Every day | ORAL | Status: DC
  Administered 2015-03-31: 40 mg via ORAL
  Filled 2015-03-29: qty 1

## 2015-03-29 MED ORDER — SODIUM CHLORIDE 0.9 % IV SOLN: INTRAVENOUS | Status: DC

## 2015-03-29 MED ORDER — ACETAMINOPHEN 500 MG PO TABS
1000.0000 mg | ORAL_TABLET | Freq: Four times a day (QID) | ORAL | Status: DC
  Administered 2015-03-30 – 2015-04-03 (×13): 1000 mg via ORAL
  Filled 2015-03-29 (×18): qty 2

## 2015-03-29 MED ORDER — PHENYLEPHRINE HCL 10 MG/ML IJ SOLN
30.0000 ug/min | INTRAVENOUS | Status: DC
  Filled 2015-03-29: qty 2

## 2015-03-29 MED ORDER — ASPIRIN 81 MG PO CHEW: 324.0000 mg | CHEWABLE_TABLET | Freq: Every day | ORAL | Status: DC

## 2015-03-29 MED ORDER — SODIUM CHLORIDE 0.9 % IJ SOLN
INTRAMUSCULAR | Status: AC
  Filled 2015-03-29: qty 20

## 2015-03-29 MED ORDER — MAGNESIUM SULFATE 50 % IJ SOLN
40.0000 meq | INTRAMUSCULAR | Status: DC
  Filled 2015-03-29: qty 10

## 2015-03-29 MED ORDER — DEXTROSE 5 % IV SOLN
1.5000 g | Freq: Two times a day (BID) | INTRAVENOUS | Status: AC
  Administered 2015-03-29 – 2015-03-31 (×4): 1.5 g via INTRAVENOUS
  Filled 2015-03-29 (×4): qty 1.5

## 2015-03-29 MED ORDER — DEXMEDETOMIDINE HCL IN NACL 400 MCG/100ML IV SOLN
0.1000 ug/kg/h | INTRAVENOUS | Status: DC
  Administered 2015-03-29: 0.2 ug/kg/h via INTRAVENOUS
  Filled 2015-03-29: qty 100

## 2015-03-29 MED ORDER — LACTATED RINGERS IV SOLN
INTRAVENOUS | Status: DC | PRN
  Administered 2015-03-29 (×2): via INTRAVENOUS

## 2015-03-29 MED ORDER — INSULIN REGULAR HUMAN 100 UNIT/ML IJ SOLN
INTRAMUSCULAR | Status: DC
  Administered 2015-03-29: 1 [IU]/h via INTRAVENOUS
  Filled 2015-03-29: qty 2.5

## 2015-03-29 MED ORDER — 0.9 % SODIUM CHLORIDE (POUR BTL) OPTIME
TOPICAL | Status: DC | PRN
  Administered 2015-03-29: 2000 mL

## 2015-03-29 MED ORDER — LACTATED RINGERS IV SOLN: 500.0000 mL | Freq: Once | INTRAVENOUS | Status: AC | PRN

## 2015-03-29 MED ORDER — LACTATED RINGERS IV SOLN
INTRAVENOUS | Status: DC
  Administered 2015-03-30: 07:00:00 via INTRAVENOUS

## 2015-03-29 MED ORDER — STERILE WATER FOR INJECTION IJ SOLN
INTRAMUSCULAR | Status: AC
  Filled 2015-03-29: qty 30

## 2015-03-29 MED ORDER — LACTATED RINGERS IV SOLN
INTRAVENOUS | Status: DC | PRN
  Administered 2015-03-29 (×2): via INTRAVENOUS

## 2015-03-29 MED ORDER — FENTANYL CITRATE (PF) 250 MCG/5ML IJ SOLN
INTRAMUSCULAR | Status: DC | PRN
  Administered 2015-03-29: 100 ug via INTRAVENOUS
  Administered 2015-03-29: 50 ug via INTRAVENOUS
  Administered 2015-03-29: 100 ug via INTRAVENOUS
  Administered 2015-03-29: 50 ug via INTRAVENOUS
  Administered 2015-03-29: 150 ug via INTRAVENOUS
  Administered 2015-03-29: 50 ug via INTRAVENOUS
  Administered 2015-03-29 (×4): 100 ug via INTRAVENOUS
  Administered 2015-03-29 (×2): 50 ug via INTRAVENOUS
  Administered 2015-03-29: 150 ug via INTRAVENOUS
  Administered 2015-03-29: 100 ug via INTRAVENOUS
  Administered 2015-03-29 (×2): 50 ug via INTRAVENOUS
  Administered 2015-03-29: 150 ug via INTRAVENOUS
  Administered 2015-03-29: 50 ug via INTRAVENOUS
  Administered 2015-03-29: 100 ug via INTRAVENOUS
  Administered 2015-03-29 (×2): 50 ug via INTRAVENOUS

## 2015-03-29 MED ORDER — ACETAMINOPHEN 650 MG RE SUPP
650.0000 mg | Freq: Once | RECTAL | Status: AC
  Administered 2015-03-29: 650 mg via RECTAL

## 2015-03-29 MED ORDER — MIDAZOLAM HCL 2 MG/2ML IJ SOLN
INTRAMUSCULAR | Status: AC
  Administered 2015-03-29: 1 mg
  Filled 2015-03-29: qty 2

## 2015-03-29 MED ORDER — ACETAMINOPHEN 160 MG/5ML PO SOLN: 650.0000 mg | Freq: Once | ORAL | Status: AC

## 2015-03-29 MED ORDER — CHLORHEXIDINE GLUCONATE CLOTH 2 % EX PADS
6.0000 | MEDICATED_PAD | Freq: Every day | CUTANEOUS | Status: DC
  Administered 2015-03-29: 6 via TOPICAL

## 2015-03-29 MED ORDER — DOCUSATE SODIUM 100 MG PO CAPS
200.0000 mg | ORAL_CAPSULE | Freq: Every day | ORAL | Status: DC
  Administered 2015-03-30 – 2015-04-03 (×5): 200 mg via ORAL
  Filled 2015-03-29 (×6): qty 2

## 2015-03-29 MED ORDER — DEXTROSE 5 % IV SOLN
750.0000 mg | INTRAVENOUS | Status: DC
  Filled 2015-03-29 (×3): qty 750

## 2015-03-29 MED ORDER — ALBUMIN HUMAN 5 % IV SOLN: 250.0000 mL | INTRAVENOUS | Status: AC | PRN

## 2015-03-29 MED ORDER — TRAMADOL HCL 50 MG PO TABS: 50.0000 mg | ORAL_TABLET | ORAL | Status: DC | PRN

## 2015-03-29 MED ORDER — EPINEPHRINE HCL 1 MG/ML IJ SOLN
0.0000 ug/min | INTRAVENOUS | Status: DC
  Filled 2015-03-29: qty 4

## 2015-03-29 MED ORDER — DOPAMINE-DEXTROSE 3.2-5 MG/ML-% IV SOLN
0.0000 ug/kg/min | INTRAVENOUS | Status: DC
  Filled 2015-03-29: qty 250

## 2015-03-29 MED ORDER — SODIUM CHLORIDE 0.9 % IV SOLN
INTRAVENOUS | Status: AC
  Administered 2015-03-29: 1000 mL
  Filled 2015-03-29: qty 1000

## 2015-03-29 MED ORDER — DEXTROSE 5 % IV SOLN
1.5000 g | INTRAVENOUS | Status: DC
  Filled 2015-03-29: qty 1.5

## 2015-03-29 MED ORDER — DEXMEDETOMIDINE HCL IN NACL 400 MCG/100ML IV SOLN
0.1000 ug/kg/h | INTRAVENOUS | Status: DC
Start: 2015-03-29 — End: 2015-03-29
  Filled 2015-03-29: qty 100

## 2015-03-29 MED ORDER — DOPAMINE-DEXTROSE 3.2-5 MG/ML-% IV SOLN: 0.0000 ug/kg/min | INTRAVENOUS | Status: DC

## 2015-03-29 MED ORDER — EPHEDRINE SULFATE 50 MG/ML IJ SOLN
INTRAMUSCULAR | Status: AC
  Filled 2015-03-29: qty 2

## 2015-03-29 SURGICAL SUPPLY — 97 items
BAG DECANTER FOR FLEXI CONT (MISCELLANEOUS) ×4 IMPLANT
BANDAGE ELASTIC 4 VELCRO ST LF (GAUZE/BANDAGES/DRESSINGS) ×2 IMPLANT
BANDAGE ELASTIC 6 VELCRO ST LF (GAUZE/BANDAGES/DRESSINGS) ×2 IMPLANT
BASKET HEART (ORDER IN 25'S) (MISCELLANEOUS) ×1
BASKET HEART (ORDER IN 25S) (MISCELLANEOUS) ×1 IMPLANT
BENZOIN TINCTURE PRP APPL 2/3 (GAUZE/BANDAGES/DRESSINGS) ×2 IMPLANT
BLADE STERNUM SYSTEM 6 (BLADE) ×2 IMPLANT
BLADE SURG 11 STRL SS (BLADE) ×2 IMPLANT
BLADE SURG ROTATE 9660 (MISCELLANEOUS) IMPLANT
BNDG GAUZE ELAST 4 BULKY (GAUZE/BANDAGES/DRESSINGS) ×2 IMPLANT
CANISTER SUCTION 2500CC (MISCELLANEOUS) ×2 IMPLANT
CANNULA EZ GLIDE AORTIC 21FR (CANNULA) ×4 IMPLANT
CATH CPB KIT OWEN (MISCELLANEOUS) ×2 IMPLANT
CATH THORACIC 36FR (CATHETERS) ×2 IMPLANT
CLIP RETRACTION 3.0MM CORONARY (MISCELLANEOUS) ×2 IMPLANT
CLIP TI MEDIUM 24 (CLIP) IMPLANT
CLIP TI WIDE RED SMALL 24 (CLIP) IMPLANT
CRADLE DONUT ADULT HEAD (MISCELLANEOUS) ×2 IMPLANT
DRAIN CHANNEL 32F RND 10.7 FF (WOUND CARE) ×4 IMPLANT
DRAPE CARDIOVASCULAR INCISE (DRAPES) ×1
DRAPE INCISE IOBAN 66X45 STRL (DRAPES) ×2 IMPLANT
DRAPE SLUSH/WARMER DISC (DRAPES) ×2 IMPLANT
DRAPE SRG 135X102X78XABS (DRAPES) ×1 IMPLANT
DRSG COVADERM 4X14 (GAUZE/BANDAGES/DRESSINGS) ×2 IMPLANT
DRSG MEPILEX BORDER 4X12 (GAUZE/BANDAGES/DRESSINGS) ×2 IMPLANT
ELECT BLADE 4.0 EZ CLEAN MEGAD (MISCELLANEOUS) ×2
ELECT REM PT RETURN 9FT ADLT (ELECTROSURGICAL) ×4
ELECTRODE BLDE 4.0 EZ CLN MEGD (MISCELLANEOUS) ×1 IMPLANT
ELECTRODE REM PT RTRN 9FT ADLT (ELECTROSURGICAL) ×2 IMPLANT
GAUZE SPONGE 4X4 12PLY STRL (GAUZE/BANDAGES/DRESSINGS) ×4 IMPLANT
GLOVE ORTHO TXT STRL SZ7.5 (GLOVE) ×4 IMPLANT
GOWN STRL REUS W/ TWL LRG LVL3 (GOWN DISPOSABLE) ×4 IMPLANT
GOWN STRL REUS W/TWL LRG LVL3 (GOWN DISPOSABLE) ×8
HEMOSTAT POWDER SURGIFOAM 1G (HEMOSTASIS) ×16 IMPLANT
INSERT FOGARTY XLG (MISCELLANEOUS) ×2 IMPLANT
KIT BASIN OR (CUSTOM PROCEDURE TRAY) ×2 IMPLANT
KIT ROOM TURNOVER OR (KITS) ×2 IMPLANT
KIT SUCTION CATH 14FR (SUCTIONS) ×6 IMPLANT
KIT VASOVIEW W/TROCAR VH 2000 (KITS) ×2 IMPLANT
LEAD PACING MYOCARDI (MISCELLANEOUS) ×2 IMPLANT
MARKER GRAFT CORONARY BYPASS (MISCELLANEOUS) ×6 IMPLANT
NS IRRIG 1000ML POUR BTL (IV SOLUTION) ×10 IMPLANT
PACK OPEN HEART (CUSTOM PROCEDURE TRAY) ×2 IMPLANT
PAD ARMBOARD 7.5X6 YLW CONV (MISCELLANEOUS) ×4 IMPLANT
PAD ELECT DEFIB RADIOL ZOLL (MISCELLANEOUS) ×2 IMPLANT
PENCIL BUTTON HOLSTER BLD 10FT (ELECTRODE) ×2 IMPLANT
PUNCH AORTIC ROTATE 4.0MM (MISCELLANEOUS) IMPLANT
PUNCH AORTIC ROTATE 4.5MM 8IN (MISCELLANEOUS) IMPLANT
PUNCH AORTIC ROTATE 5MM 8IN (MISCELLANEOUS) IMPLANT
SET CARDIOPLEGIA MPS 5001102 (MISCELLANEOUS) ×2 IMPLANT
SOLUTION ANTI FOG 6CC (MISCELLANEOUS) IMPLANT
SPONGE GAUZE 4X4 12PLY STER LF (GAUZE/BANDAGES/DRESSINGS) ×4 IMPLANT
SPONGE LAP 18X18 X RAY DECT (DISPOSABLE) ×4 IMPLANT
SPONGE LAP 4X18 X RAY DECT (DISPOSABLE) ×2 IMPLANT
STRIP CLOSURE SKIN 1/2X4 (GAUZE/BANDAGES/DRESSINGS) ×2 IMPLANT
SUT BONE WAX W31G (SUTURE) ×2 IMPLANT
SUT ETHIBOND X763 2 0 SH 1 (SUTURE) ×4 IMPLANT
SUT MNCRL AB 3-0 PS2 18 (SUTURE) ×4 IMPLANT
SUT MNCRL AB 4-0 PS2 18 (SUTURE) ×2 IMPLANT
SUT PDS AB 1 CTX 36 (SUTURE) ×4 IMPLANT
SUT PROLENE 2 0 SH DA (SUTURE) IMPLANT
SUT PROLENE 3 0 SH DA (SUTURE) ×2 IMPLANT
SUT PROLENE 3 0 SH1 36 (SUTURE) IMPLANT
SUT PROLENE 4 0 RB 1 (SUTURE) ×1
SUT PROLENE 4 0 SH DA (SUTURE) IMPLANT
SUT PROLENE 4-0 RB1 .5 CRCL 36 (SUTURE) ×1 IMPLANT
SUT PROLENE 5 0 C 1 36 (SUTURE) IMPLANT
SUT PROLENE 6 0 C 1 30 (SUTURE) ×4 IMPLANT
SUT PROLENE 7.0 RB 3 (SUTURE) ×6 IMPLANT
SUT PROLENE 8 0 BV175 6 (SUTURE) ×2 IMPLANT
SUT PROLENE BLUE 7 0 (SUTURE) ×2 IMPLANT
SUT PROLENE POLY MONO (SUTURE) IMPLANT
SUT SILK  1 MH (SUTURE) ×2
SUT SILK 1 MH (SUTURE) ×2 IMPLANT
SUT STEEL 6MS V (SUTURE) IMPLANT
SUT STEEL STERNAL CCS#1 18IN (SUTURE) ×2 IMPLANT
SUT STEEL SZ 6 DBL 3X14 BALL (SUTURE) ×4 IMPLANT
SUT VIC AB 1 CTX 36 (SUTURE)
SUT VIC AB 1 CTX36XBRD ANBCTR (SUTURE) IMPLANT
SUT VIC AB 2-0 CT1 27 (SUTURE)
SUT VIC AB 2-0 CT1 36 (SUTURE) ×2 IMPLANT
SUT VIC AB 2-0 CT1 TAPERPNT 27 (SUTURE) IMPLANT
SUT VIC AB 2-0 CTX 27 (SUTURE) IMPLANT
SUT VIC AB 3-0 SH 27 (SUTURE)
SUT VIC AB 3-0 SH 27X BRD (SUTURE) IMPLANT
SUT VIC AB 3-0 X1 27 (SUTURE) IMPLANT
SUT VICRYL 4-0 PS2 18IN ABS (SUTURE) IMPLANT
SUTURE E-PAK OPEN HEART (SUTURE) ×2 IMPLANT
SYSTEM SAHARA CHEST DRAIN ATS (WOUND CARE) ×2 IMPLANT
TAPE CLOTH SURG 4X10 WHT LF (GAUZE/BANDAGES/DRESSINGS) ×4 IMPLANT
TAPE PAPER 2X10 WHT MICROPORE (GAUZE/BANDAGES/DRESSINGS) ×2 IMPLANT
TOWEL OR 17X24 6PK STRL BLUE (TOWEL DISPOSABLE) ×4 IMPLANT
TOWEL OR 17X26 10 PK STRL BLUE (TOWEL DISPOSABLE) ×4 IMPLANT
TRAY FOLEY IC TEMP SENS 16FR (CATHETERS) ×2 IMPLANT
TUBING INSUFFLATION (TUBING) ×2 IMPLANT
UNDERPAD 30X30 INCONTINENT (UNDERPADS AND DIAPERS) ×2 IMPLANT
WATER STERILE IRR 1000ML POUR (IV SOLUTION) ×4 IMPLANT

## 2015-03-29 NOTE — Progress Notes (Signed)
.       [address]       [place] [address]             [phone]      S/p CABG x 2  Intubated, sedated  BP 120/53 mmHg  Pulse 80  Temp(Src) 97.5 F (36.4 C) (Oral)  Resp 19  Ht 5\' 9"  (1.753 m)  Wt 252 lb 6.8 oz (114.5 kg)  BMI 37.26 kg/m2  SpO2 98%   Intake/Output Summary (Last 24 hours) at 03/29/15 1910 Last data filed at 03/29/15 1830  Gross per 24 hour  Intake 5817.99 ml  Output   2375 ml  Net 3442.99 ml    Ci= 2.1 PA= 40/22  K= 3.5- supplement  Some blood in OG tube  Odin C. Roxan Hockey, MD [facility] [phone]

## 2015-03-29 NOTE — Brief Op Note (Addendum)
03/21/2015 - 03/29/2015  4:52 PM  PATIENT:  Randall Wilkins  74 y.o. male  PRE-OPERATIVE DIAGNOSIS:  Left main CAD  POST-OPERATIVE DIAGNOSIS:  Left main CAD  PROCEDURE:   CORONARY ARTERY BYPASS GRAFTING x 2 (LIMA-D, SVG-OM) ENDOSCOPIC GREATER SAPHENOUS VEIN HARVEST RIGHT THIGH  SURGEON:    Rexene Alberts, MD  ASSISTANTS:  Suzzanne Cloud, PA-C  ANESTHESIA:   Finis Bud, MD  CROSSCLAMP TIME:   73'  CARDIOPULMONARY BYPASS TIME: 17'  FINDINGS:  Normal LV function  Aortic valve sclerosis without aortic stenosis  Good quality LIMA conduit for grafting  Good quality SVG conduit for grafting  Good quality target vessels for grafting  COMPLICATIONS: None  BASELINE WEIGHT: 114 kg  PATIENT DISPOSITION:   TO SICU IN STABLE CONDITION  Rexene Alberts 03/29/2015 6:05 PM

## 2015-03-29 NOTE — Progress Notes (Signed)
Pre-op Cardiac Surgery  Carotid Findings:  Right = 1-39% ICA stenosis. Left = 40-59% ICA stenosis.  Antegrade vertebral flow.   Upper Extremity Right Left  Brachial Pressures    Radial Waveforms    Ulnar Waveforms    Palmar Arch (Allen's Test) Decreases >50% with radial compression, normal with ulnar compression Obliterates with radial compression, normal with ulnar compression   Landry Mellow, RDMS, RVT 03/29/2015

## 2015-03-29 NOTE — Op Note (Signed)
CARDIOTHORACIC SURGERY OPERATIVE NOTE  Date of Procedure: 03/29/2015  Preoperative Diagnosis:   Critical Left Main Coronary Artery Disease  S/P Acute non-ST Segment Elevation Myocardial Infarction  Postoperative Diagnosis: Same  Procedure:    Coronary Artery Bypass Grafting x 2   Left Internal Mammary Artery to First Diagonal Branch of Left Anterior Descending Coronary Artery  Saphenous Vein Graft to Obtuse Marginal Branch of Left Circumflex Coronary Artery  Endoscopic Vein Harvest from Right Thigh  Surgeon: Valentina Gu. Roxy Manns, MD  Assistant: Suzzanne Cloud, PA-C  Anesthesia: Finis Bud, MD  Operative Findings:  Normal LV function  Aortic valve sclerosis without aortic stenosis  Good quality LIMA conduit for grafting  Good quality SVG conduit for grafting  Good quality target vessels for grafting   BRIEF CLINICAL NOTE AND INDICATIONS FOR SURGERY  Patient is a 74 year old obese white male with multiple medical problems including known history of coronary artery disease, hypertension, diabetes mellitus with complication, chronic kidney disease, chronic diastolic congestive heart failure, chronic venous insufficiency, remote history of stroke, mild chronic short term memory impairment, and peripheral neuropathy moderately decreased physical mobility who was in his usual state of health until last week when he developed fairly acute onset increased confusion, urinary frequency and a fall that resulted in a large hematoma in his left flank. The patient was taken to his primary care physician's office where he was seen by Dr. Alroy Dust and diagnosed with acute urinary tract infection. He was directly admitted to the hospital for intravenous antibiotics and further therapy on 03/21/2015. Urine culture grew greater than 100,000 colonies Escherichia coli that was resistant to ciprofloxacin But sensitive to ceftriaxone. At the time of admission the patient had acute renal failure  that was felt to be related to dehydration with baseline chronic kidney disease, stage III. The patient initially did well but developed acute respiratory insufficiency with hypoxemia on the morning of 03/23/2015. Chest x-ray revealed some right lung opacities suggestive of possible aspiration pneumonia versus unilateral pulmonary edema. Troponin levels were markedly positive and the patient ruled in for an acute non-ST segment elevation myocardial infarction. The patient was started on intravenous heparin and cardiology consultation was obtained. The patient improved clinically within less than 24 hours. Plans for cardiac catheterization were held until the patient's renal function stabilized near his baseline. The patient had several melanotic stools while anticoagulated with heparin, aspirin and Plavix associated with a slight fall of hemoglobin from 11.6 on 03/22/2015 to 10.1 on 03/28/2015. The patient underwent upper GI endoscopy by Dr. Watt Climes on 03/27/2015 that revealed Significant ulcerations in the duodenal bulb without signs of active bleeding, distal esophageal ulcers and narrowing, and a "tiny" hiatal hernia. There was no sign of active bleeding appreciated. The patient underwent elective diagnostic cardiac catheterization This afternoon by Dr. Martinique. He was found have severe left main and three-vessel coronary artery disease. Cardiothoracic surgical consultation was requested.  The patient has been seen in consultation and counseled at length regarding the indications, risks and potential benefits of surgery.  All questions have been answered, and the patient provides full informed consent for the operation as described.    DETAILS OF THE OPERATIVE PROCEDURE  Preparation:  The patient is brought to the operating room on the above mentioned date and central monitoring was established by the anesthesia team including placement of Swan-Ganz catheter and radial arterial line. The patient is  placed in the supine position on the operating table.  Intravenous antibiotics are administered. General endotracheal anesthesia is induced uneventfully.  A Foley catheter is placed.  Baseline transesophageal echocardiogram was performed.  Findings were notable for normal LV function.  The aortic valve was tricuspid and mildly sclerotic but all 3 leaflets moved well.  Peak velocity across the aortic valve measured approximately 2.5 m/sec corresponding to a mean transvalvular gradient estimated 14 mmHg, but there was relatively high velocity across the LV outflow tract, such that the dimensionless velocity ratio was elevated to 0.44.    The patient's chest, abdomen, both groins, and both lower extremities are prepared and draped in a sterile manner. A time out procedure is performed.   Surgical Approach and Conduit Harvest:  A median sternotomy incision was performed and the left internal mammary artery is dissected from the chest wall and prepared for bypass grafting. The left internal mammary artery is notably good quality conduit. Simultaneously, the greater saphenous vein is obtained from the patient's right thigh using endoscopic vein harvest technique. The saphenous vein is notably good quality conduit. After removal of the saphenous vein, the small surgical incisions in the lower extremity are closed with absorbable suture. Following systemic heparinization, the left internal mammary artery was transected distally noted to have excellent flow.   Extracorporeal Cardiopulmonary Bypass and Myocardial Protection:  The pericardium is opened. The ascending aorta is normal in appearance. The ascending aorta and the right atrium are cannulated for cardiopulmonary bypass.  Adequate heparinization is verified.  The entire pre-bypass portion of the operation was notable for stable hemodynamics.  Cardiopulmonary bypass was begun and the surface of the heart is inspected. Distal target vessels are selected  for coronary artery bypass grafting. A cardioplegia cannula is placed in the ascending aorta.  A temperature probe was placed in the interventricular septum.  The patient is allowed to cool passively to [facility] systemic temperature.  The aortic cross clamp is applied and cold blood cardioplegia is delivered initially in an antegrade fashion through the aortic root.  Iced saline slush is applied for topical hypothermia.  The initial cardioplegic arrest is rapid with early diastolic arrest.  Repeat doses of cardioplegia are administered intermittently throughout the entire cross clamp portion of the operation through the aortic root and through subsequently placed vein graft in order to maintain completely flat electrocardiogram and septal myocardial temperature below 15C.  Myocardial protection was felt to be excellent.  Coronary Artery Bypass Grafting:   The obtuse marginal branch of the left circumflex coronary artery was grafted using a reversed saphenous vein graft in an end-to-side fashion.  At the site of distal anastomosis the target vessel was good quality and measured approximately 2.0 mm in diameter.  The first diagonal branch of the left anterior descending coronary artery was grafted using the left internal mammary artery in an end-to-side fashion.  The diagonal branch was the dominant branch supplying the anterior wall.  The left internal mammary artery was much smaller and dove intramyocardial to supply the interventricular septum.  At the site of distal anastomosis the target vessel was good quality and measured approximately 2.0 mm in diameter.  All proximal vein graft anastomoses were placed directly to the ascending aorta prior to removal of the aortic cross clamp.  The septal myocardial temperature rose rapidly after reperfusion of the left internal mammary artery graft.  The aortic cross clamp was removed after a total cross clamp time of 47 minutes.   Procedure Completion:  All  proximal and distal coronary anastomoses were inspected for hemostasis and appropriate graft orientation. Epicardial pacing wires are fixed to the  right ventricular outflow tract and to the right atrial appendage. The patient is rewarmed to 37C temperature. The patient is weaned and disconnected from cardiopulmonary bypass.  The patient's rhythm at separation from bypass was sinus.  The patient was weaned from cardiopulmonary bypass without any inotropic support. Total cardiopulmonary bypass time for the operation was 56 minutes.  Followup transesophageal echocardiogram performed after separation from bypass revealed no changes from the preoperative exam.  The aortic and venous cannula were removed uneventfully. Protamine was administered to reverse the anticoagulation. The mediastinum and pleural space were inspected for hemostasis and irrigated with saline solution.  The patient received a total of 2 packs adult platelets due to coagulopathy and history of Plavix use after separation from cardiopulmonary bypass and reversal of heparin with protamine.  The mediastinum and the left pleural space were drained using 3 chest tubes placed through separate stab incisions inferiorly.  The soft tissues anterior to the aorta were reapproximated loosely. The sternum is closed with double strength sternal wire. The soft tissues anterior to the sternum were closed in multiple layers and the skin is closed with a running subcuticular skin closure.  The post-bypass portion of the operation was notable for stable rhythm and hemodynamics.  The patient received 2 units packed red blood cells during the procedure due to anemia which was present preoperatively and exacerbated by acute blood loss and hemodilution during cardiopulmonary bypass.   Disposition:  The patient tolerated the procedure well and is transported to the surgical intensive care in stable condition. There are no intraoperative complications. All  sponge instrument and needle counts are verified correct at completion of the operation.    Valentina Gu. Roxy Manns MD 03/29/2015 6:07 PM

## 2015-03-29 NOTE — Anesthesia Preprocedure Evaluation (Signed)
Anesthesia Evaluation  Patient identified by MRN, date of birth, ID band Patient awake    Reviewed: Allergy & Precautions, NPO status , Patient's Chart, lab work & pertinent test results, reviewed documented beta blocker date and time   Airway Mallampati: III  TM Distance: <3 FB Neck ROM: Full    Dental  (+) Teeth Intact, Caps, Dental Advisory Given   Pulmonary shortness of breath, with exertion and lying, sleep apnea and Continuous Positive Airway Pressure Ventilation , former smoker,          Cardiovascular hypertension, + CAD, + Past MI, + Cardiac Stents, + Peripheral Vascular Disease and +CHF     Neuro/Psych CVA    GI/Hepatic PUD,   Endo/Other  diabetes  Renal/GU Renal InsufficiencyRenal disease     Musculoskeletal   Abdominal   Peds  Hematology   Anesthesia Other Findings   Reproductive/Obstetrics                             Anesthesia Physical Anesthesia Plan  ASA: III  Anesthesia Plan: General   Post-op Pain Management:    Induction: Intravenous  Airway Management Planned: Oral ETT  Additional Equipment: Arterial line, PA Cath, TEE and Ultrasound Guidance Line Placement  Intra-op Plan:   Post-operative Plan: Post-operative intubation/ventilation  Informed Consent: I have reviewed the patients History and Physical, chart, labs and discussed the procedure including the risks, benefits and alternatives for the proposed anesthesia with the patient or authorized representative who has indicated his/her understanding and acceptance.   Dental advisory given  Plan Discussed with: CRNA, Anesthesiologist and Surgeon  Anesthesia Plan Comments:         Anesthesia Quick Evaluation

## 2015-03-29 NOTE — Progress Notes (Signed)
[address]       [place] [address]             [phone]     CARDIOTHORACIC SURGERY PROGRESS NOTE  Subjective: Feels well.  No chest pain or SOB  Objective: Vital signs in last 24 hours: Temp:  [97.4 F (36.3 C)-98.4 F (36.9 C)] 98.1 F (36.7 C) (04/15 0400) Pulse Rate:  [46-57] 52 (04/15 0600) Cardiac Rhythm:  [-] Sinus bradycardia (04/15 0400) Resp:  [12-32] 24 (04/15 0600) BP: (124-166)/(44-103) 129/49 mmHg (04/15 0600) SpO2:  [95 %-100 %] 100 % (04/15 0600) FiO2 (%):  [28 %] 28 % (04/14 2249) Weight:  [114.5 kg (252 lb 6.8 oz)] 114.5 kg (252 lb 6.8 oz) (04/15 0500)  Physical Exam:  Rhythm:   Sinus brady  Breath sounds: clear  Heart sounds:  RRR  Incisions:  n/a  Abdomen:  soft  Extremities:  warm   Intake/Output from previous day: 04/14 0701 - 04/15 0700 In: 4270 [P.O.:410; I.V.:1238; IV Piggyback:100] Out: 2425 [Urine:2425] Intake/Output this shift:    Lab Results:  Recent Labs  03/27/15 0230 03/28/15 0325  WBC 6.9 7.0  HGB 10.6* 10.2*  HCT 31.4* 30.3*  PLT 252 159   BMET:  Recent Labs  03/28/15 0325 03/29/15 0054  NA 142 141  K 3.5 3.2*  CL 107 106  CO2 24 24  GLUCOSE 146* 104*  BUN 21 15  CREATININE 1.36* 1.30  CALCIUM 8.5 8.2*    CBG (last 3)   Recent Labs  03/28/15 1211 03/28/15 1920 03/28/15 2246  GLUCAP 162* 117* 108*   PT/INR:   Recent Labs  03/29/15 0325  LABPROT 15.0  INR 1.16   Platelet Inhibition Assay: Results for Martinique, Dequincy S (MRN [id number]) as of 03/29/2015 07:18  Ref. Range 03/28/2015 18:22  Platelet Function  P2Y12 Latest Ref Range: 194-418 PRU 179 (L)     CXR:  PORTABLE CHEST - 1 VIEW  COMPARISON: Radiographs 03/24/2015 and 03/23/2015.  FINDINGS: 1842 hours. Two views obtained. The heart size and mediastinal contours are stable. There is mild vascular congestion with near-complete interval clearing of the previously demonstrated right perihilar airspace disease.  There is no edema or significant pleural effusion. There is no pneumothorax. Postsurgical changes in the lower cervical spine status post multilevel fusion noted.  IMPRESSION: Resolving right perihilar airspace disease. No acute findings demonstrated.   Electronically Signed  By: Richardean Sale M.D.  On: 03/28/2015 18:51  Assessment/Plan:  I have discussed options again with the patient and his family at the bedside this morning.  We discussed the possibility of proceeding with CABG today despite the fact that platelet function assay confirms the presence of some degree of platelet inhibition related to long term Plavix therapy versus waiting until early next week.  Although delay would likely be safe on IV heparin, the patient's left main stenosis is critically tight, and there would unquestionably be some associated risk.  We have decided to proceed with surgery today.  The patient understands the significant risk of bleeding, the high likelihood that he will need to receive blood products, and the potential for other complications related to bleeding.  The patient understands and accepts all potential associated risks of surgery including but not limited to risk of death, stroke or other neurologic complication, myocardial infarction, congestive heart failure, respiratory failure, renal failure, bleeding requiring blood transfusion and/or reexploration, aortic dissection or other major vascular complication, arrhythmia, heart block or bradycardia requiring  permanent pacemaker, pneumonia, pleural effusion, wound infection, pulmonary embolus or other thromboembolic complication, chronic pain or other delayed complications related to median sternotomy, or the late recurrence of symptomatic ischemic heart disease and/or congestive heart failure.  The importance of long term risk modification have been emphasized.  All questions answered.  For OR today.   I spent in excess of 15 minutes during  the conduct of this hospital encounter and >50% of this time involved direct face-to-face encounter with the patient for counseling and/or coordination of their care.   Rexene Alberts 03/29/2015 7:02 AM

## 2015-03-29 NOTE — Progress Notes (Signed)
ANTIBIOTIC CONSULT NOTE - INITIAL  Pharmacy Consult for Rocephin Indication: UTI  Allergies  Allergen Reactions  . Actos [Pioglitazone] Swelling  . Metformin And Related Nausea Only  . Niaspan [Niacin Er] Itching and Rash    Patient Measurements: Height: 5\' 9"  (175.3 cm) Weight: 252 lb 6.8 oz (114.5 kg) IBW/kg (Calculated) : 70.7  Vital Signs: Temp: 97.9 F (36.6 C) (04/15 2015) Temp Source: Core (Comment) (04/15 1840) BP: 107/60 mmHg (04/15 2000) Pulse Rate: 80 (04/15 2015) Intake/Output from previous day: 04/14 0701 - 04/15 0700 In: 1540 [P.O.:410; I.V.:1238; IV Piggyback:100] Out: 2425 [Urine:2425] Intake/Output from this shift: Total I/O In: 80 [I.V.:30; IV Piggyback:50] Out: -   Labs:  Recent Labs  03/28/15 0325  03/29/15 0848  03/29/15 1613 03/29/15 1640 03/29/15 1643 03/29/15 1722 03/29/15 1845 03/29/15 1851  WBC 7.0  --  10.0  --   --   --   --   --  16.8*  --   HGB 10.2*  --  10.0*  < > 6.5* 8.3* 8.2* 8.2* 9.8* 11.6*  PLT 159  --  189  --   --  188  --   --  159  --   CREATININE 1.36*  < >  --   < > 1.00  --  1.10 1.20  --   --   < > = values in this interval not displayed. Estimated Creatinine Clearance: 67.4 mL/min (by C-G formula based on Cr of 1.2). No results for input(s): VANCOTROUGH, VANCOPEAK, VANCORANDOM, GENTTROUGH, GENTPEAK, GENTRANDOM, TOBRATROUGH, TOBRAPEAK, TOBRARND, AMIKACINPEAK, AMIKACINTROU, AMIKACIN in the last 72 hours.   Microbiology: Recent Results (from the past 720 hour(s))  Urine culture     Status: None   Collection Time: 03/21/15  3:02 PM  Result Value Ref Range Status   Specimen Description URINE, CLEAN CATCH  Final   Special Requests NONE  Final   Colony Count   Final    >=100,000 COLONIES/ML Performed at [facility]    Culture   Final    ESCHERICHIA COLI Performed at [facility]    Report Status 03/23/2015 FINAL  Final   Organism ID, Bacteria ESCHERICHIA COLI  Final      Susceptibility    Escherichia coli - MIC*    AMPICILLIN 8 SENSITIVE Sensitive     CEFAZOLIN <=4 SENSITIVE Sensitive     CEFTRIAXONE <=1 SENSITIVE Sensitive     CIPROFLOXACIN >=4 RESISTANT Resistant     GENTAMICIN <=1 SENSITIVE Sensitive     LEVOFLOXACIN >=8 RESISTANT Resistant     NITROFURANTOIN 64 INTERMEDIATE Intermediate     TOBRAMYCIN <=1 SENSITIVE Sensitive     TRIMETH/SULFA <=20 SENSITIVE Sensitive     PIP/TAZO <=4 SENSITIVE Sensitive     * ESCHERICHIA COLI  Urine culture     Status: None   Collection Time: 03/21/15  6:00 PM  Result Value Ref Range Status   Specimen Description URINE, RANDOM  Final   Special Requests NONE  Final   Colony Count NO GROWTH Performed at [facility]   Final   Culture NO GROWTH Performed at [facility]   Final   Report Status 03/23/2015 FINAL  Final  Clostridium Difficile by PCR     Status: None   Collection Time: 03/21/15  9:33 PM  Result Value Ref Range Status   C difficile by pcr NEGATIVE NEGATIVE Final  MRSA PCR Screening     Status: None   Collection Time: 03/23/15  8:06 AM  Result Value Ref Range Status   MRSA by PCR NEGATIVE NEGATIVE Final    Comment:        The GeneXpert MRSA Assay (FDA approved for NASAL specimens only), is one component of a comprehensive MRSA colonization surveillance program. It is not intended to diagnose MRSA infection nor to guide or monitor treatment for MRSA infections.   Culture, blood (routine x 2)     Status: None   Collection Time: 03/23/15  8:25 AM  Result Value Ref Range Status   Specimen Description BLOOD RIGHT ARM  Final   Special Requests BOTTLES DRAWN AEROBIC AND ANAEROBIC 10CC  Final   Culture   Final    NO GROWTH 5 DAYS Note: Culture results may be compromised due to an excessive volume of blood received in culture bottles. Performed at [facility]    Report Status 03/29/2015 FINAL  Final  Culture, blood (routine x 2)     Status: None   Collection Time: 03/23/15  8:32 AM   Result Value Ref Range Status   Specimen Description BLOOD RIGHT ARM  Final   Special Requests BOTTLES DRAWN AEROBIC AND ANAEROBIC [place]  Final   Culture   Final    NO GROWTH 5 DAYS Performed at [facility]    Report Status 03/29/2015 FINAL  Final  Culture, blood (routine x 2)     Status: None   Collection Time: 03/23/15 10:00 AM  Result Value Ref Range Status   Specimen Description BLOOD LEFT HAND  Final   Special Requests BOTTLES DRAWN AEROBIC ONLY 2CC  Final   Culture   Final    NO GROWTH 5 DAYS Note: Culture results may be compromised due to an inadequate volume of blood received in culture bottles. Performed at [facility]    Report Status 03/29/2015 FINAL  Final  Culture, blood (routine x 2)     Status: None   Collection Time: 03/23/15 10:15 AM  Result Value Ref Range Status   Specimen Description BLOOD RIGHT HAND  Final   Special Requests BOTTLES DRAWN AEROBIC ONLY 2CC  Final   Culture   Final    NO GROWTH 5 DAYS Note: Culture results may be compromised due to an inadequate volume of blood received in culture bottles. Performed at [facility]    Report Status 03/29/2015 FINAL  Final  Surgical pcr screen     Status: Abnormal   Collection Time: 03/28/15 11:50 PM  Result Value Ref Range Status   MRSA, PCR NEGATIVE NEGATIVE Final   Staphylococcus aureus POSITIVE (A) NEGATIVE Final    Comment:        The Xpert SA Assay (FDA approved for NASAL specimens in patients over 55 years of age), is one component of a comprehensive surveillance program.  Test performance has been validated by [facility] for patients greater than or equal to 22 year old. It is not intended to diagnose infection nor to guide or monitor treatment. RESULT CALLED TO, READ BACK BY AND VERIFIED WITHElmon Kirschner RN [phone] GREEN R     Medical History: Past Medical History  Diagnosis Date  . Rhabdomyolysis   . Memory loss   . Exogenous obesity   . Insulin  dependent diabetes mellitus   . Peripheral neuropathy   . Venous insufficiency   . CAD (coronary artery disease)   . Hyperlipidemia   . Gout   . Stroke     L patietal with small scattered lacunar infarcts  .  Left foot drop   . Hypertension   . History of nuclear stress test 07/2011    dipyridamole; fixed inferolateral defect, worse at stress than rest; no reversible ischemia; low risk scan   . OSA on CPAP     uses a cpap  . Heart attack   . Aortic stenosis, mild 11/14/2013  . Bilateral leg edema 05/21/2014  . Diabetic peripheral neuropathy associated with type 1 diabetes mellitus 11/14/2013  . Obesity (BMI 30-39.9) 11/14/2013  . Thrombocytopenia 03/21/2015  . Obstructive sleep apnea 03/21/2015  . Dyspnea on exertion 03/21/2015  . Diabetes 1.5, managed as type 1 02/04/2013  . Weakness generalized 03/21/2015  . Chronic diastolic congestive heart failure   . Chronic kidney disease (CKD), stage III (moderate)   . Dysphagia   . Peptic ulcer with hemorrhage 03/28/2015  . Left main coronary artery disease 03/28/2015  . S/P CABG x 2 03/29/2015    LIMA to Diagonal, SVG to OM, EVH via right thigh    Medications:  Scheduled:  . [START ON 03/30/2015] acetaminophen  1,000 mg Oral 4 times per day   Or  . [START ON 03/30/2015] acetaminophen (TYLENOL) oral liquid 160 mg/5 mL  1,000 mg Per Tube 4 times per day  . [START ON 03/30/2015] aspirin EC  325 mg Oral Daily   Or  . [START ON 03/30/2015] aspirin  324 mg Per Tube Daily  . [START ON 03/30/2015] bisacodyl  10 mg Oral Daily   Or  . [START ON 03/30/2015] bisacodyl  10 mg Rectal Daily  . [START ON 03/31/2015] cefTRIAXone (ROCEPHIN)  IV  1 g Intravenous Q24H  . cefUROXime (ZINACEF)  IV  1.5 g Intravenous Q12H  . [START ON 03/30/2015] docusate sodium  200 mg Oral Daily  . donepezil  10 mg Oral Daily  . famotidine (PEPCID) IV  20 mg Intravenous Q12H  . fentaNYL      . [START ON 03/30/2015] insulin regular  0-10 Units Intravenous TID WC  . magnesium sulfate  4 g  Intravenous Once  . metoprolol tartrate  12.5 mg Oral BID   Or  . metoprolol tartrate  12.5 mg Per Tube BID  . omega-3 acid ethyl esters  1 g Oral BID  . [START ON 03/31/2015] pantoprazole  40 mg Oral Daily  . potassium chloride  10 mEq Intravenous Q1 Hr x 3  . rosuvastatin  20 mg Oral Daily  . [START ON 03/30/2015] sodium chloride  3 mL Intravenous Q12H  . tamsulosin  0.4 mg Oral QHS  . [START ON 03/30/2015] vancomycin  1,000 mg Intravenous Once  . [DISCONTINUED] vancomycin  1,500 mg Intravenous To OR   Assessment: 74 y.o. male well known to pharmacy from previous antibiotic dosing and OHS monitoring. Consult for pharmacy to dose Rocephin for UTI. Noted that pt has been on antibiotics for 9 days. Ecoli UTI on urine culture from 4/7. Pt currently has cefuroxime x48 hours post OHS so will not need Rocephin to start until Cefuroxime is complete as Cefuroxime will cover UTI.  Goal of Therapy:  Resolution of infection  Plan:  Rocephin 1gm IV q24h - will start on 4/17 (after post-op Cefuroxime completed) Pharmacy will sign off - please reconsult if needed Consider d/c antibiotics unless new UTI suspected.  Sherlon Handing, PharmD, BCPS Clinical pharmacist, pager [phone] 03/29/2015,8:21 PM

## 2015-03-29 NOTE — Transfer of Care (Signed)
Immediate Anesthesia Transfer of Care Note  Patient: Randall Wilkins  Procedure(s) Performed: Procedure(s): CORONARY ARTERY BYPASS GRAFTING TIMES TWO USING LEFT INTERNAL MAMMARY ARTERY AND RIGHT LEG GREATER SAPHENOUS VEIN HARVESTED ENDOSCOPICALLY. (N/A)  Patient Location: SICU  Anesthesia Type:General  Level of Consciousness: Patient remains intubated per anesthesia plan  Airway & Oxygen Therapy: Patient remains intubated per anesthesia plan and Patient placed on Ventilator (see vital sign flow sheet for setting)  Post-op Assessment: Report given to RN and Post -op Vital signs reviewed and stable  Post vital signs: Reviewed and stable  Last Vitals:  Filed Vitals:   03/29/15 1850  BP:   Pulse: 80  Temp: 36.4 C  Resp: 19    Complications: No apparent anesthesia complications

## 2015-03-29 NOTE — Anesthesia Procedure Notes (Signed)
Procedure Name: Intubation Date/Time: 03/29/2015 2:07 PM Performed by: Maude Leriche D Pre-anesthesia Checklist: Patient identified, Emergency Drugs available, Suction available, Patient being monitored and Timeout performed Patient Re-evaluated:Patient Re-evaluated prior to inductionOxygen Delivery Method: Circle system utilized Preoxygenation: Pre-oxygenation with 100% oxygen Intubation Type: IV induction Ventilation: Mask ventilation without difficulty and Oral airway inserted - appropriate to patient size Laryngoscope Size: Glidescope, Miller and 2 (One attempt with Miller 2. Successful attempt with Glidescope.) Grade View: Grade I Tube type: Subglottic suction tube Tube size: 8.0 mm Number of attempts: 2 (Grade 3 view with Miller 2. Handle  light went out during DL- no attempt to place ETT. Successful placement with Glide.) Airway Equipment and Method: Stylet and Video-laryngoscopy Placement Confirmation: ETT inserted through vocal cords under direct vision,  positive ETCO2 and breath sounds checked- equal and bilateral Secured at: 23 cm Tube secured with: Tape Dental Injury: Teeth and Oropharynx as per pre-operative assessment

## 2015-03-29 NOTE — Progress Notes (Signed)
Echocardiogram 2D Echocardiogram has been performed.  Joelene Millin 03/29/2015, 2:59 PM

## 2015-03-29 NOTE — Progress Notes (Signed)
Patient Demographics  Randall Wilkins, is a 74 y.o. male, DOB - [birth year], DTO:[id number]  Admit date - 03/21/2015   Admitting Physician Randall Karlyne Greenspan, MD  Outpatient Primary MD for the patient is Randall Huh, MD  LOS - 8   Chief Complaint  Patient presents with  . Urinary Frequency  . Altered Mental Status      Summary  74 y.o. male with a PMH of stroke, MI, CAD, DM, Rhabdomyolysis, dysphagia, short term memory loss and hypertension who was admitted for encephalopathy caused by UTI. He subsequently developed aspiration pneumonia, hypoxic respiratory failure, MI, upper GI bleed. He was transferred to stepdown unit. GI-cardiology are following the patient.   He underwent EGD on 03/27/2015 showing non-bleeding duodenal bulb ulcer, he also had left heart catheterization by Dr. Martinique on 03/28/2015 showing left main along with 3 vessel disease, is now seen by cardiothoracic surgery for CABG.  Subjective:   Randall Wilkins today has, No headache, No chest pain, No abdominal pain - No Nausea, No new weakness tingling or numbness, No Cough, much improved shortness of breath. No further black stools.  Assessment & Plan    1.Acute Hypoxic Resp Failure - due to NSTEMI + Aspiration HCAP - post BIPAP .   A. Cardiology consulted . Always remained chest pain-free, seen by cardiology underwent left heart cath. EKG was nonacute but troponin was significantly elevated requiring a left heart catheterization on 03/28/2015 by Dr. [name]. After cath revealed left main disease along with three-vessel disease, he is currently on medical treatment including IV heparin, beta blocker and statin per cardiology. Currently not on aspirin and Plavix per cardiology likely due to recent GI bleed. Along with consideration  for CABG.   He is due for CABG by cardiothoracic surgery on 03/29/2015.   B. Aspiration pneumonia. History of dysphagia, he remembers choking on food night of 03/22/2015, right middle lobe opacity, Leukocytosis, fever of 101 on 03/23/2015 with hypoxic respiratory failure. He is now symptom free. Has been tapered down to IV Zosyn. Postop he can be switched to Augmentin for 3 more days and then it can be stopped.     2. 2 episodes of black loose stools on 03/25/2015. H&H is only mildly dropped, was Hemoccult positive, aspirin and heparin had to be stopped on 03/25/2015. Underwent EGD on 03/27/2015 confirming nonbleeding duodenal bulb ulcer. On IV PPI. H. pylori serology pending. GI on board. H&H remains stable when accounted for dilution and mild upper GI bleed.     3. Acute renal failure on chronic kidney disease stage III. Baseline creatinine close to 1.5. Was due to dehydration has resolved after hydration and now better or at baseline.    4. DM type II. She on Lantus along with sliding scale, will add pre-meal NovoLog monitor sugars closely. While nothing by mouth Will assume on a gentle D5 drip with every 2 hour Accu-Cheks.  Lab Results  Component Value Date   HGBA1C 7.7* 03/21/2015    CBG (last 3)   Recent Labs  03/28/15 1920 03/28/15 2246 03/29/15 0801  GLUCAP 117* 108* 108*     5.Toxic encephalopathy due to UTI upon admission. Has resolved. Is baseline. Continue antibiotics as in #1 above.    6.Dyslipidemia -  on statin.    7.CAD - as in #1.    8. Dysphagia. GI following. Has long-standing history of dysphagia after an episode of esophageal rupture in the past. Was nothing by mouth for 2 days per speech therapy. On 03/25/2015 swallowing improved and now placed on regular diet per speech. Continue to monitor.   9. OSA. Uses C Pap at night.   10. Hypokalemia. Replaced via IV KCl on 03/29/2015 8 AM. 30 meq replaced.      Code Status: Full  Family  Communication: Wife, son  - clearly explained poor prognosis and critical state.  Disposition Plan: TBD   Procedures   L heart Cath  Final Conclusions:  1. Critical left main and three vessel obstructive CAD 2. Elevated LVEDP.  Recommendations: will transfer to ICU. Patient is without pain and hemodynamically stable. Will start low dose IV Ntg. Resume IV heparin in 8 hours. Plavix will be held. Gentle diuresis. CT surgery consult called.    EGD  - Non bleeding Duodenal ulcer.   TTE  - Left ventricle: The cavity size was normal. Wall thickness wasincreased in a pattern of mild LVH. Systolic function was normal.The estimated ejection fraction was in the range of 55% to 60%.There is hypokinesis of the basalinferolateral myocardium.Doppler parameters are consistent with abnormal left ventricularrelaxation (grade 1 diastolic dysfunction). Doppler parametersare consistent with high ventricular filling pressure. - Mitral valve: Calcified annulus.  Impressions: Mild hypokinesis of the basal inferolateral wall with overallnormal LV function; grade 1 diastolic dysfunction with elevatedLV filling pressure; calcified aortic valve; mean gradient of 59mmHg suggests mild AS but visually valve opens well.   Consults  Cards, GI, PCCM   Medications  Scheduled Meds: . aminocaproic acid (AMICAR) for OHS   Intravenous To OR  . amitriptyline  50 mg Oral QHS  . amLODipine  5 mg Oral Daily  . bisacodyl  5 mg Oral Once  . cefUROXime (ZINACEF)  IV  1.5 g Intravenous To OR  . cefUROXime (ZINACEF)  IV  750 mg Intravenous To OR  . Chlorhexidine Gluconate Cloth  6 each Topical Daily  . dexmedetomidine  0.1-0.7 mcg/kg/hr Intravenous To OR  . donepezil  10 mg Oral Daily  . DOPamine  0-10 mcg/kg/min Intravenous To OR  . epinephrine  0-10 mcg/min Intravenous To OR  . heparin-papaverine-plasmalyte irrigation   Irrigation To OR  . heparin 30,000 units/NS 1000 mL solution for CELLSAVER   Other To  OR  . insulin aspart  0-15 Units Subcutaneous TID WC  . insulin aspart  0-5 Units Subcutaneous QHS  . insulin aspart  4 Units Subcutaneous TID WC  . insulin glargine  20 Units Subcutaneous BID  . insulin (NOVOLIN-R) infusion   Intravenous To OR  . magnesium sulfate  40 mEq Other To OR  . metoprolol succinate  12.5 mg Oral Daily  . mupirocin ointment  1 application Nasal BID  . nitroGLYCERIN  2-200 mcg/min Intravenous To OR  . omega-3 acid ethyl esters  1 g Oral BID  . pantoprazole (PROTONIX) IV  40 mg Intravenous Q12H  . phenylephrine (NEO-SYNEPHRINE) Adult infusion  30-200 mcg/min Intravenous To OR  . piperacillin-tazobactam (ZOSYN)  IV  3.375 g Intravenous 3 times per day  . potassium chloride  10 mEq Intravenous Q1 Hr x 4  . potassium chloride  80 mEq Other To OR  . rosuvastatin  20 mg Oral Daily  . tamsulosin  0.4 mg Oral QHS  . vancomycin 1000 mg in NS (1000 ml) irrigation  for Dr. Roxy Manns case   Irrigation To OR  . vancomycin  1,500 mg Intravenous To OR   Continuous Infusions: . dextrose 50 mL (03/29/15 0809)  . nitroGLYCERIN 17 mcg/min (03/29/15 0426)   PRN Meds:.acetaminophen **OR** acetaminophen, albuterol, alum & mag hydroxide-simeth, ondansetron **OR** ondansetron (ZOFRAN) IV, oxyCODONE  DVT Prophylaxis  Heparin - SCDs   Lab Results  Component Value Date   PLT 189 03/29/2015    Antibiotics     Anti-infectives    Start     Dose/Rate Route Frequency Ordered Stop   03/29/15 0745  vancomycin (VANCOCIN) 1,500 mg in sodium chloride 0.9 % 250 mL IVPB     1,500 mg 125 mL/hr over 120 Minutes Intravenous To Surgery 03/29/15 0743 03/30/15 0745   03/29/15 0745  cefUROXime (ZINACEF) 1.5 g in dextrose 5 % 50 mL IVPB     1.5 g 100 mL/hr over 30 Minutes Intravenous To Surgery 03/29/15 0743 03/30/15 0745   03/29/15 0745  cefUROXime (ZINACEF) 750 mg in dextrose 5 % 50 mL IVPB     750 mg 100 mL/hr over 30 Minutes Intravenous To Surgery 03/29/15 0736 03/30/15 0745   03/29/15 0745   vancomycin (VANCOCIN) 1,000 mg in sodium chloride 0.9 % 1,000 mL irrigation      Irrigation To Surgery 03/29/15 0736 03/30/15 0745   03/23/15 0845  vancomycin (VANCOCIN) 1,250 mg in sodium chloride 0.9 % 250 mL IVPB  Status:  Discontinued     1,250 mg 166.7 mL/hr over 90 Minutes Intravenous Every 24 hours 03/23/15 0830 03/25/15 0955   03/23/15 0845  piperacillin-tazobactam (ZOSYN) IVPB 3.375 g     3.375 g 12.5 mL/hr over 240 Minutes Intravenous 3 times per day 03/23/15 0830     03/22/15 1500  cefTRIAXone (ROCEPHIN) 1 g in dextrose 5 % 50 mL IVPB - Premix  Status:  Discontinued     1 g 100 mL/hr over 30 Minutes Intravenous Every 24 hours 03/21/15 1710 03/23/15 0752   03/21/15 1500  cefTRIAXone (ROCEPHIN) 1 g in dextrose 5 % 50 mL IVPB     1 g 100 mL/hr over 30 Minutes Intravenous  Once 03/21/15 1452 03/21/15 1540          Objective:   Filed Vitals:   03/29/15 0600 03/29/15 0800 03/29/15 0900 03/29/15 0930  BP: 129/49 125/88 134/41   Pulse: 52 53 53 55  Temp:  97.7 F (36.5 C)    TempSrc:  Oral    Resp: 24 17 20    Height:      Weight:      SpO2: 100% 98% 100%     Wt Readings from Last 3 Encounters:  03/29/15 114.5 kg (252 lb 6.8 oz)  12/11/14 117.935 kg (260 lb)  11/14/14 116.62 kg (257 lb 1.6 oz)     Intake/Output Summary (Last 24 hours) at 03/29/15 1003 Last data filed at 03/29/15 0900  Gross per 24 hour  Intake 1913.29 ml  Output   2425 ml  Net -511.71 ml     Physical Exam  Awake Alert, Oriented X 3, No new F.N deficits, Normal affect,  [place].AT,PERRAL Supple Neck,No JVD, No cervical lymphadenopathy appriciated.  Symmetrical Chest wall movement, Good air movement bilaterally, RML rales improved RRR,No Gallops,Rubs or new Murmurs, No Parasternal Heave +ve B.Sounds, Abd Soft, No tenderness, No organomegaly appriciated, No rebound - guarding or rigidity. No Cyanosis, Clubbing or edema, No new Rash or bruise     Data Review   Micro Results Recent Results  (  from the past 240 hour(s))  Urine culture     Status: None   Collection Time: 03/21/15  3:02 PM  Result Value Ref Range Status   Specimen Description URINE, CLEAN CATCH  Final   Special Requests NONE  Final   Colony Count   Final    >=100,000 COLONIES/ML Performed at [facility]    Culture   Final    ESCHERICHIA COLI Performed at [facility]    Report Status 03/23/2015 FINAL  Final   Organism ID, Bacteria ESCHERICHIA COLI  Final      Susceptibility   Escherichia coli - MIC*    AMPICILLIN 8 SENSITIVE Sensitive     CEFAZOLIN <=4 SENSITIVE Sensitive     CEFTRIAXONE <=1 SENSITIVE Sensitive     CIPROFLOXACIN >=4 RESISTANT Resistant     GENTAMICIN <=1 SENSITIVE Sensitive     LEVOFLOXACIN >=8 RESISTANT Resistant     NITROFURANTOIN 64 INTERMEDIATE Intermediate     TOBRAMYCIN <=1 SENSITIVE Sensitive     TRIMETH/SULFA <=20 SENSITIVE Sensitive     PIP/TAZO <=4 SENSITIVE Sensitive     * ESCHERICHIA COLI  Urine culture     Status: None   Collection Time: 03/21/15  6:00 PM  Result Value Ref Range Status   Specimen Description URINE, RANDOM  Final   Special Requests NONE  Final   Colony Count NO GROWTH Performed at [facility]   Final   Culture NO GROWTH Performed at [facility]   Final   Report Status 03/23/2015 FINAL  Final  Clostridium Difficile by PCR     Status: None   Collection Time: 03/21/15  9:33 PM  Result Value Ref Range Status   C difficile by pcr NEGATIVE NEGATIVE Final  MRSA PCR Screening     Status: None   Collection Time: 03/23/15  8:06 AM  Result Value Ref Range Status   MRSA by PCR NEGATIVE NEGATIVE Final    Comment:        The GeneXpert MRSA Assay (FDA approved for NASAL specimens only), is one component of a comprehensive MRSA colonization surveillance program. It is not intended to diagnose MRSA infection nor to guide or monitor treatment for MRSA infections.   Culture, blood (routine x 2)     Status: None    Collection Time: 03/23/15  8:25 AM  Result Value Ref Range Status   Specimen Description BLOOD RIGHT ARM  Final   Special Requests BOTTLES DRAWN AEROBIC AND ANAEROBIC 10CC  Final   Culture   Final    NO GROWTH 5 DAYS Note: Culture results may be compromised due to an excessive volume of blood received in culture bottles. Performed at [facility]    Report Status 03/29/2015 FINAL  Final  Culture, blood (routine x 2)     Status: None   Collection Time: 03/23/15  8:32 AM  Result Value Ref Range Status   Specimen Description BLOOD RIGHT ARM  Final   Special Requests BOTTLES DRAWN AEROBIC AND ANAEROBIC [place]  Final   Culture   Final    NO GROWTH 5 DAYS Performed at [facility]    Report Status 03/29/2015 FINAL  Final  Culture, blood (routine x 2)     Status: None   Collection Time: 03/23/15 10:00 AM  Result Value Ref Range Status   Specimen Description BLOOD LEFT HAND  Final   Special Requests BOTTLES DRAWN AEROBIC ONLY [place]  Final   Culture   Final  NO GROWTH 5 DAYS Note: Culture results may be compromised due to an inadequate volume of blood received in culture bottles. Performed at [facility]    Report Status 03/29/2015 FINAL  Final  Culture, blood (routine x 2)     Status: None   Collection Time: 03/23/15 10:15 AM  Result Value Ref Range Status   Specimen Description BLOOD RIGHT HAND  Final   Special Requests BOTTLES DRAWN AEROBIC ONLY 2CC  Final   Culture   Final    NO GROWTH 5 DAYS Note: Culture results may be compromised due to an inadequate volume of blood received in culture bottles. Performed at [facility]    Report Status 03/29/2015 FINAL  Final  Surgical pcr screen     Status: Abnormal   Collection Time: 03/28/15 11:50 PM  Result Value Ref Range Status   MRSA, PCR NEGATIVE NEGATIVE Final   Staphylococcus aureus POSITIVE (A) NEGATIVE Final    Comment:        The Xpert SA Assay (FDA approved for NASAL specimens in patients  over 100 years of age), is one component of a comprehensive surveillance program.  Test performance has been validated by [facility] for patients greater than or equal to 37 year old. It is not intended to diagnose infection nor to guide or monitor treatment. RESULT CALLED TO, READ BACK BY AND VERIFIED WITHElmon Kirschner RN [phone] GREEN R     Radiology Reports   X-ray Chest Pa And Lateral  03/22/2015   CLINICAL DATA:  Shortness of breath  EXAM: CHEST  2 VIEW  COMPARISON:  11/14/2009  FINDINGS: No cardiomegaly for technique. Aortic and hilar contours are negative. 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 No edema, consolidation, effusion, or pneumothorax.  IMPRESSION: No active cardiopulmonary disease.   Electronically Signed   By: Monte Fantasia M.D.   On: 03/22/2015 01:10   Dg Esophagus  02/27/2015   CLINICAL DATA:  Dysphagia  EXAM: ESOPHOGRAM / BARIUM SWALLOW / BARIUM TABLET STUDY  TECHNIQUE: Combined double contrast and single contrast examination performed using effervescent crystals, thick barium liquid, and thin barium liquid. The patient was observed with fluoroscopy swallowing a 86mm barium sulphate tablet.  FLUOROSCOPY TIME:  4 minutes 0 seconds  COMPARISON:  None  FINDINGS: No cervical esophageal narrowing. Cervical plate noted from anterior fusion at C3-4. Separate plate at E4-2.  Fluoroscopic evaluation of swallowing demonstrates disruption of 3 out of 4 primary esophageal peristaltic waves with moderate tertiary contractions. No fold thickening.  There is mild distal esophageal short segment stricture, best seen on the upright views and after administration of the barium tablet. The barium tablet sticks for several minutes in the distal esophagus. The tablet also initially sticks to the right of midline in the cervical esophagus, reproduce in the patient's symptoms. This is presumably within the right piriform sinus. The barium tablet finally passes into  the esophagus but lodges distally in area of mild distal esophageal stricture.  IMPRESSION: The barium tablet initially lodges in the right piriform sinus, reproducing the patient's symptoms. This finally passes into the esophagus, but lodges in the distal esophagus at an area of mild short segment distal esophageal stricture.  Esophageal dysmotility with disruption of primary peristaltic waves an moderate tertiary contractions.   Electronically Signed   By: Rolm Baptise M.D.   On: 02/27/2015 12:38   US Renal  03/24/2015   CLINICAL DATA:  74 year old male with acute renal failure.  EXAM: RENAL/URINARY TRACT ULTRASOUND COMPLETE  COMPARISON:  None.  FINDINGS: Right Kidney:  Length: 10.2 cm. A 3.8 x 3.9 cm lower pole cyst is present. Echogenicity within normal limits. No mass or hydronephrosis visualized.  Left Kidney:  Length: 11.5 cm. Echogenicity within normal limits. No mass or hydronephrosis visualized.  Bladder:  Appears normal for degree of bladder distention.  IMPRESSION: Right renal cyst, otherwise unremarkable renal ultrasound. No evidence of hydronephrosis.   Electronically Signed   By: Margarette Canada M.D.   On: 03/24/2015 12:18   Dg Chest Port 1 View  03/28/2015   CLINICAL DATA:  Chest pain.  Cardiac catheterization today.  EXAM: PORTABLE CHEST - 1 VIEW  COMPARISON:  Radiographs 03/24/2015 and 03/23/2015.  FINDINGS: 1842 hours. Two views obtained. The heart size and mediastinal contours are stable. There is mild vascular congestion with near-complete interval clearing of the previously demonstrated right perihilar airspace disease. There is no edema or significant pleural effusion. There is no pneumothorax. Postsurgical changes in the lower cervical spine status post multilevel fusion noted.  IMPRESSION: Resolving right perihilar airspace disease. No acute findings demonstrated.   Electronically Signed   By: Richardean Sale M.D.   On: 03/28/2015 18:51   Dg Chest Port 1 View  03/24/2015   CLINICAL  DATA:  Shortness of breath and respiratory failure.  EXAM: PORTABLE CHEST - 1 VIEW  COMPARISON:  03/23/2015 and prior radiographs  FINDINGS: The cardiomediastinal silhouette is unchanged.  Right lung airspace disease is again identified.  No pneumothorax or definite effusion noted.  Elevation of right hemidiaphragm again identified.  Prior cervical fusion changes noted.  IMPRESSION: Right lung airspace disease - not significantly changed.   Electronically Signed   By: Margarette Canada M.D.   On: 03/24/2015 09:57   Dg Chest Port 1 View  03/23/2015   CLINICAL DATA:  Shortness of breath.  EXAM: PORTABLE CHEST - 1 VIEW  COMPARISON:  03/21/2015  FINDINGS: Lungs are hypoinflated and demonstrate a new moderate airspace process over the central right lung. No evidence of effusion or pneumothorax. Cardiomediastinal silhouette and remainder of the exam is unchanged.  IMPRESSION: New moderate airspace process over the central right lung likely infection and less likely asymmetric edema.   Electronically Signed   By: Marin Olp M.D.   On: 03/23/2015 08:12     CBC  Recent Labs Lab 03/25/15 0324  03/25/15 2300 03/26/15 0501 03/27/15 0230 03/28/15 0325 03/29/15 0848  WBC 8.4  --   --  6.9 6.9 7.0 10.0  HGB 12.1*  < > 10.8* 11.3* 10.6* 10.2* 10.0*  HCT 36.8*  < > 33.4* 34.6* 31.4* 30.3* 30.1*  PLT 120*  --   --  127* 252 159 189  MCV 91.1  --   --  91.1 89.7 90.7 89.1  MCH 30.0  --   --  29.7 30.3 30.5 29.6  MCHC 32.9  --   --  32.7 33.8 33.7 33.2  RDW 15.3  --   --  15.4 15.2 15.1 15.2  < > = values in this interval not displayed.  Chemistries   Recent Labs Lab 03/23/15 1018  03/25/15 0324 03/26/15 0501 03/27/15 0230 03/28/15 0325 03/29/15 0054  NA 135  < > 146* 142 141 142 141  K 3.9  < > 3.8 3.6 4.3 3.5 3.2*  CL 98  < > 109 108 110 107 106  CO2 22  < > 33* 25 23 24 24   GLUCOSE 205*  < >  112* 230* 178* 146* 104*  BUN 49*  < > 50* 41* 29* 21 15  CREATININE 2.44*  < > 1.83* 1.63* 1.47* 1.36*  1.30  CALCIUM 8.3*  < > 8.5 8.5 8.2* 8.5 8.2*  AST 67*  --   --   --   --   --  29  ALT 22  --   --   --   --   --  29  ALKPHOS 64  --   --   --   --   --  62  BILITOT 1.1  --   --   --   --   --  0.7  < > = values in this interval not displayed. ------------------------------------------------------------------------------------------------------------------ estimated creatinine clearance is 62.2 mL/min (by C-G formula based on Cr of 1.3). ------------------------------------------------------------------------------------------------------------------ No results for input(s): HGBA1C in the last 72 hours. ------------------------------------------------------------------------------------------------------------------ No results for input(s): CHOL, HDL, LDLCALC, TRIG, CHOLHDL, LDLDIRECT in the last 72 hours. ------------------------------------------------------------------------------------------------------------------ No results for input(s): TSH, T4TOTAL, T3FREE, THYROIDAB in the last 72 hours.  Invalid input(s): FREET3 ------------------------------------------------------------------------------------------------------------------ No results for input(s): VITAMINB12, FOLATE, FERRITIN, TIBC, IRON, RETICCTPCT in the last 72 hours.  Coagulation profile  Recent Labs Lab 03/29/15 0325  INR 1.16    No results for input(s): DDIMER in the last 72 hours.  Cardiac Enzymes  Recent Labs Lab 03/23/15 1010 03/23/15 1628 03/23/15 2240  TROPONINI 9.21* 23.54* 26.86*   ------------------------------------------------------------------------------------------------------------------ Invalid input(s): POCBNP   Time Spent in minutes   35   Treyvonne Tata K M.D on 03/29/2015 at 10:03 AM  Between 7am to 7pm - Pager - [phone]  After 7pm go to [email or web address] - password [email or web address]  [facility]   Office  [phone]

## 2015-03-29 NOTE — Progress Notes (Signed)
Patient Name: Randall Wilkins Date of Encounter: 03/29/2015     Principal Problem:   Sepsis Active Problems:   Memory impairment   Hyperlipidemia   Diabetes 1.5, managed as type 1   CVA (cerebral infarction)   CAD S/P percutaneous coronary angioplasty   Bilateral leg edema   Obstructive sleep apnea   Acute renal failure   Mild diastolic dysfunction   Subendocardial MI subsequent episode care   Acute respiratory failure with hypoxia   NSTEMI (non-ST elevated myocardial infarction)   AKI (acute kidney injury)   Chronic diastolic congestive heart failure   Chronic kidney disease (CKD), stage III (moderate)   Dysphagia   Peptic ulcer with hemorrhage   Left main coronary artery disease    SUBJECTIVE  No chest pain overnight. For CABG later today.  CURRENT MEDS . aminocaproic acid (AMICAR) for OHS   Intravenous To OR  . amitriptyline  50 mg Oral QHS  . amLODipine  5 mg Oral Daily  . bisacodyl  5 mg Oral Once  . cefUROXime (ZINACEF)  IV  1.5 g Intravenous To OR  . cefUROXime (ZINACEF)  IV  750 mg Intravenous To OR  . Chlorhexidine Gluconate Cloth  6 each Topical Daily  . dexmedetomidine  0.1-0.7 mcg/kg/hr Intravenous To OR  . donepezil  10 mg Oral Daily  . DOPamine  0-10 mcg/kg/min Intravenous To OR  . epinephrine  0-10 mcg/min Intravenous To OR  . heparin-papaverine-plasmalyte irrigation   Irrigation To OR  . heparin 30,000 units/NS 1000 mL solution for CELLSAVER   Other To OR  . insulin aspart  0-15 Units Subcutaneous TID WC  . insulin aspart  0-5 Units Subcutaneous QHS  . insulin aspart  4 Units Subcutaneous TID WC  . insulin glargine  20 Units Subcutaneous BID  . insulin (NOVOLIN-R) infusion   Intravenous To OR  . magnesium sulfate  40 mEq Other To OR  . metoprolol succinate  12.5 mg Oral Daily  . metoprolol tartrate  12.5 mg Oral Once  . mupirocin ointment  1 application Nasal BID  . nitroGLYCERIN  2-200 mcg/min Intravenous To OR  . omega-3 acid ethyl esters  1  g Oral BID  . pantoprazole (PROTONIX) IV  40 mg Intravenous Q12H  . phenylephrine (NEO-SYNEPHRINE) Adult infusion  30-200 mcg/min Intravenous To OR  . piperacillin-tazobactam (ZOSYN)  IV  3.375 g Intravenous 3 times per day  . potassium chloride  10 mEq Intravenous Q1 Hr x 4  . potassium chloride  80 mEq Other To OR  . rosuvastatin  20 mg Oral Daily  . tamsulosin  0.4 mg Oral QHS  . vancomycin 1000 mg in NS (1000 ml) irrigation for Dr. Roxy Manns case   Irrigation To OR  . vancomycin  1,500 mg Intravenous To OR    OBJECTIVE  Filed Vitals:   03/29/15 0500 03/29/15 0600 03/29/15 0800 03/29/15 0900  BP: 137/50 129/49 125/88 134/41  Pulse: 52 52 53 53  Temp:   97.7 F (36.5 C)   TempSrc:   Oral   Resp: 22 24 17 20   Height:      Weight: 252 lb 6.8 oz (114.5 kg)     SpO2: 100% 100% 98% 100%    Intake/Output Summary (Last 24 hours) at 03/29/15 0921 Last data filed at 03/29/15 0900  Gross per 24 hour  Intake 1913.29 ml  Output   2425 ml  Net -511.71 ml   Filed Weights   03/27/15 0500 03/28/15 0422 03/29/15 0500  Weight: 256 lb 13.4 oz (116.5 kg) 259 lb 14.8 oz (117.9 kg) 252 lb 6.8 oz (114.5 kg)    PHYSICAL EXAM  General: Pleasant, NAD. Neuro: Alert and oriented X 3. Moves all extremities spontaneously. Psych: Normal affect. HEENT:  Normal  Neck: Supple without bruits or JVD. Lungs:  Resp regular and unlabored, CTA. Heart: RRR no s3, s4, Grade 2/6 systolic murmur at base. Abdomen: Soft, non-tender, non-distended, BS + x 4.  Extremities: No clubbing, cyanosis.  DP/PT/Radials 2+ and equal bilaterally. 1+edema  Accessory Clinical Findings  CBC  Recent Labs  03/28/15 0325 03/29/15 0848  WBC 7.0 10.0  HGB 10.2* 10.0*  HCT 30.3* 30.1*  MCV 90.7 89.1  PLT 159 469   Basic Metabolic Panel  Recent Labs  03/28/15 0325 03/29/15 0054  NA 142 141  K 3.5 3.2*  CL 107 106  CO2 24 24  GLUCOSE 146* 104*  BUN 21 15  CREATININE 1.36* 1.30  CALCIUM 8.5 8.2*   Liver  Function Tests  Recent Labs  03/29/15 0054  AST 29  ALT 29  ALKPHOS 62  BILITOT 0.7  PROT 5.3*  ALBUMIN 2.4*   No results for input(s): LIPASE, AMYLASE in the last 72 hours. Cardiac Enzymes No results for input(s): CKTOTAL, CKMB, CKMBINDEX, TROPONINI in the last 72 hours. BNP Invalid input(s): POCBNP D-Dimer No results for input(s): DDIMER in the last 72 hours. Hemoglobin A1C No results for input(s): HGBA1C in the last 72 hours. Fasting Lipid Panel No results for input(s): CHOL, HDL, LDLCALC, TRIG, CHOLHDL, LDLDIRECT in the last 72 hours. Thyroid Function Tests No results for input(s): TSH, T4TOTAL, T3FREE, THYROIDAB in the last 72 hours.  Invalid input(s): FREET3  TELE  Sinus bradycardia.  ECG    Radiology/Studies  X-ray Chest Pa And Lateral  03/22/2015   CLINICAL DATA:  Shortness of breath  EXAM: CHEST  2 VIEW  COMPARISON:  11/14/2009  FINDINGS: No cardiomegaly for technique. Aortic and hilar contours are negative. 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 No edema, consolidation, effusion, or pneumothorax.  IMPRESSION: No active cardiopulmonary disease.   Electronically Signed   By: Monte Fantasia M.D.   On: 03/22/2015 01:10   Dg Esophagus  02/27/2015   CLINICAL DATA:  Dysphagia  EXAM: ESOPHOGRAM / BARIUM SWALLOW / BARIUM TABLET STUDY  TECHNIQUE: Combined double contrast and single contrast examination performed using effervescent crystals, thick barium liquid, and thin barium liquid. The patient was observed with fluoroscopy swallowing a 71mm barium sulphate tablet.  FLUOROSCOPY TIME:  4 minutes 0 seconds  COMPARISON:  None  FINDINGS: No cervical esophageal narrowing. Cervical plate noted from anterior fusion at C3-4. Separate plate at G2-9.  Fluoroscopic evaluation of swallowing demonstrates disruption of 3 out of 4 primary esophageal peristaltic waves with moderate tertiary contractions. No fold thickening.  There is mild distal esophageal  short segment stricture, best seen on the upright views and after administration of the barium tablet. The barium tablet sticks for several minutes in the distal esophagus. The tablet also initially sticks to the right of midline in the cervical esophagus, reproduce in the patient's symptoms. This is presumably within the right piriform sinus. The barium tablet finally passes into the esophagus but lodges distally in area of mild distal esophageal stricture.  IMPRESSION: The barium tablet initially lodges in the right piriform sinus, reproducing the patient's symptoms. This finally passes into the esophagus, but lodges in the distal esophagus at an area of mild short segment distal  esophageal stricture.  Esophageal dysmotility with disruption of primary peristaltic waves an moderate tertiary contractions.   Electronically Signed   By: Rolm Baptise M.D.   On: 02/27/2015 12:38   US Renal  03/24/2015   CLINICAL DATA:  74 year old male with acute renal failure.  EXAM: RENAL/URINARY TRACT ULTRASOUND COMPLETE  COMPARISON:  None.  FINDINGS: Right Kidney:  Length: 10.2 cm. A 3.8 x 3.9 cm lower pole cyst is present. Echogenicity within normal limits. No mass or hydronephrosis visualized.  Left Kidney:  Length: 11.5 cm. Echogenicity within normal limits. No mass or hydronephrosis visualized.  Bladder:  Appears normal for degree of bladder distention.  IMPRESSION: Right renal cyst, otherwise unremarkable renal ultrasound. No evidence of hydronephrosis.   Electronically Signed   By: Margarette Canada M.D.   On: 03/24/2015 12:18   Dg Chest Port 1 View  03/28/2015   CLINICAL DATA:  Chest pain.  Cardiac catheterization today.  EXAM: PORTABLE CHEST - 1 VIEW  COMPARISON:  Radiographs 03/24/2015 and 03/23/2015.  FINDINGS: 1842 hours. Two views obtained. The heart size and mediastinal contours are stable. There is mild vascular congestion with near-complete interval clearing of the previously demonstrated right perihilar airspace  disease. There is no edema or significant pleural effusion. There is no pneumothorax. Postsurgical changes in the lower cervical spine status post multilevel fusion noted.  IMPRESSION: Resolving right perihilar airspace disease. No acute findings demonstrated.   Electronically Signed   By: Richardean Sale M.D.   On: 03/28/2015 18:51   Dg Chest Port 1 View  03/24/2015   CLINICAL DATA:  Shortness of breath and respiratory failure.  EXAM: PORTABLE CHEST - 1 VIEW  COMPARISON:  03/23/2015 and prior radiographs  FINDINGS: The cardiomediastinal silhouette is unchanged.  Right lung airspace disease is again identified.  No pneumothorax or definite effusion noted.  Elevation of right hemidiaphragm again identified.  Prior cervical fusion changes noted.  IMPRESSION: Right lung airspace disease - not significantly changed.   Electronically Signed   By: Margarette Canada M.D.   On: 03/24/2015 09:57   Dg Chest Port 1 View  03/23/2015   CLINICAL DATA:  Shortness of breath.  EXAM: PORTABLE CHEST - 1 VIEW  COMPARISON:  03/21/2015  FINDINGS: Lungs are hypoinflated and demonstrate a new moderate airspace process over the central right lung. No evidence of effusion or pneumothorax. Cardiomediastinal silhouette and remainder of the exam is unchanged.  IMPRESSION: New moderate airspace process over the central right lung likely infection and less likely asymmetric edema.   Electronically Signed   By: Marin Olp M.D.   On: 03/23/2015 08:12    ASSESSMENT AND PLAN 1. NSTEMI  With left main disease. 2. Mild chronic kidney disease stable after cath 3. Recent GI bleed  Plan:CABG today.  Signed, Darlin Coco MD

## 2015-03-30 ENCOUNTER — Inpatient Hospital Stay (HOSPITAL_COMMUNITY): Payer: Medicare Other

## 2015-03-30 DIAGNOSIS — I2510 Atherosclerotic heart disease of native coronary artery without angina pectoris: Secondary | ICD-10-CM | POA: Diagnosis present

## 2015-03-30 LAB — BASIC METABOLIC PANEL
Anion gap: 7 (ref 5–15)
BUN: 13 mg/dL (ref 6–23)
CO2: 22 mmol/L (ref 19–32)
Calcium: 7.5 mg/dL — ABNORMAL LOW (ref 8.4–10.5)
Chloride: 111 mmol/L (ref 96–112)
Creatinine, Ser: 1.27 mg/dL (ref 0.50–1.35)
GFR calc Af Amer: 63 mL/min — ABNORMAL LOW (ref >90)
GFR calc non Af Amer: 54 mL/min — ABNORMAL LOW (ref >90)
Glucose, Bld: 147 mg/dL — ABNORMAL HIGH (ref 70–99)
Potassium: 4.5 mmol/L (ref 3.5–5.1)
Sodium: 140 mmol/L (ref 135–145)

## 2015-03-30 LAB — POCT I-STAT 3, ART BLOOD GAS (G3+)
Acid-base deficit: 3 mmol/L — ABNORMAL HIGH (ref 0.0–2.0)
Acid-base deficit: 4 mmol/L — ABNORMAL HIGH (ref 0.0–2.0)
Bicarbonate: 21.4 mEq/L (ref 20.0–24.0)
Bicarbonate: 22.8 mEq/L (ref 20.0–24.0)
O2 Saturation: 95 %
O2 Saturation: 96 %
Patient temperature: 37.4
Patient temperature: 37.5
TCO2: 23 mmol/L (ref 0–100)
TCO2: 24 mmol/L (ref 0–100)
pCO2 arterial: 38.4 mmHg (ref 35.0–45.0)
pCO2 arterial: 45.1 mmHg — ABNORMAL HIGH (ref 35.0–45.0)
pH, Arterial: 7.314 — ABNORMAL LOW (ref 7.350–7.450)
pH, Arterial: 7.356 (ref 7.350–7.450)
pO2, Arterial: 82 mmHg (ref 80.0–100.0)
pO2, Arterial: 95 mmHg (ref 80.0–100.0)

## 2015-03-30 LAB — CBC
HCT: 29.6 % — ABNORMAL LOW (ref 39.0–52.0)
HCT: 29.6 % — ABNORMAL LOW (ref 39.0–52.0)
HCT: 29.5 % — ABNORMAL LOW (ref 39.0–52.0)
Hemoglobin: 10.1 g/dL — ABNORMAL LOW (ref 13.0–17.0)
Hemoglobin: 9.9 g/dL — ABNORMAL LOW (ref 13.0–17.0)
Hemoglobin: 10 g/dL — ABNORMAL LOW (ref 13.0–17.0)
MCH: 30.1 pg (ref 26.0–34.0)
MCH: 30.1 pg (ref 26.0–34.0)
MCH: 30.2 pg (ref 26.0–34.0)
MCHC: 34.1 g/dL (ref 30.0–36.0)
MCHC: 33.4 g/dL (ref 30.0–36.0)
MCHC: 33.9 g/dL (ref 30.0–36.0)
MCV: 88.4 fL (ref 78.0–100.0)
MCV: 90 fL (ref 78.0–100.0)
MCV: 89.1 fL (ref 78.0–100.0)
Platelets: 184 10*3/uL (ref 150–400)
Platelets: 181 10*3/uL (ref 150–400)
Platelets: 175 10*3/uL (ref 150–400)
RBC: 3.35 MIL/uL — ABNORMAL LOW (ref 4.22–5.81)
RBC: 3.29 MIL/uL — ABNORMAL LOW (ref 4.22–5.81)
RBC: 3.31 MIL/uL — ABNORMAL LOW (ref 4.22–5.81)
RDW: 15.4 % (ref 11.5–15.5)
RDW: 15.5 % (ref 11.5–15.5)
RDW: 15.6 % — ABNORMAL HIGH (ref 11.5–15.5)
WBC: 15.8 10*3/uL — ABNORMAL HIGH (ref 4.0–10.5)
WBC: 16.7 10*3/uL — ABNORMAL HIGH (ref 4.0–10.5)
WBC: 16.2 10*3/uL — ABNORMAL HIGH (ref 4.0–10.5)

## 2015-03-30 LAB — GLUCOSE, CAPILLARY
Glucose-Capillary: 109 mg/dL — ABNORMAL HIGH (ref 70–99)
Glucose-Capillary: 107 mg/dL — ABNORMAL HIGH (ref 70–99)
Glucose-Capillary: 135 mg/dL — ABNORMAL HIGH (ref 70–99)
Glucose-Capillary: 125 mg/dL — ABNORMAL HIGH (ref 70–99)
Glucose-Capillary: 115 mg/dL — ABNORMAL HIGH (ref 70–99)
Glucose-Capillary: 125 mg/dL — ABNORMAL HIGH (ref 70–99)
Glucose-Capillary: 119 mg/dL — ABNORMAL HIGH (ref 70–99)
Glucose-Capillary: 119 mg/dL — ABNORMAL HIGH (ref 70–99)
Glucose-Capillary: 128 mg/dL — ABNORMAL HIGH (ref 70–99)
Glucose-Capillary: 135 mg/dL — ABNORMAL HIGH (ref 70–99)
Glucose-Capillary: 131 mg/dL — ABNORMAL HIGH (ref 70–99)
Glucose-Capillary: 143 mg/dL — ABNORMAL HIGH (ref 70–99)
Glucose-Capillary: 147 mg/dL — ABNORMAL HIGH (ref 70–99)
Glucose-Capillary: 143 mg/dL — ABNORMAL HIGH (ref 70–99)
Glucose-Capillary: 218 mg/dL — ABNORMAL HIGH (ref 70–99)

## 2015-03-30 LAB — PREPARE PLATELET PHERESIS
Unit division: 0
Unit division: 0

## 2015-03-30 LAB — POCT I-STAT, CHEM 8
BUN: 13 mg/dL (ref 6–23)
Calcium, Ion: 1.18 mmol/L (ref 1.13–1.30)
Chloride: 102 mmol/L (ref 96–112)
Creatinine, Ser: 1.4 mg/dL — ABNORMAL HIGH (ref 0.50–1.35)
Glucose, Bld: 156 mg/dL — ABNORMAL HIGH (ref 70–99)
HCT: 29 % — ABNORMAL LOW (ref 39.0–52.0)
Hemoglobin: 9.9 g/dL — ABNORMAL LOW (ref 13.0–17.0)
Potassium: 3.9 mmol/L (ref 3.5–5.1)
Sodium: 137 mmol/L (ref 135–145)
TCO2: 19 mmol/L (ref 0–100)

## 2015-03-30 LAB — CREATININE, SERUM
Creatinine, Ser: 1.34 mg/dL (ref 0.50–1.35)
Creatinine, Ser: 1.4 mg/dL — ABNORMAL HIGH (ref 0.50–1.35)
GFR calc Af Amer: 59 mL/min — ABNORMAL LOW (ref >90)
GFR calc Af Amer: 56 mL/min — ABNORMAL LOW (ref >90)
GFR calc non Af Amer: 51 mL/min — ABNORMAL LOW (ref >90)
GFR calc non Af Amer: 48 mL/min — ABNORMAL LOW (ref >90)

## 2015-03-30 LAB — MAGNESIUM
Magnesium: 2.5 mg/dL (ref 1.5–2.5)
Magnesium: 2.2 mg/dL (ref 1.5–2.5)

## 2015-03-30 LAB — HEMOGLOBIN A1C
Hgb A1c MFr Bld: 7.7 % — ABNORMAL HIGH (ref 4.8–5.6)
Mean Plasma Glucose: 174 mg/dL

## 2015-03-30 MED ORDER — INSULIN ASPART 100 UNIT/ML ~~LOC~~ SOLN
4.0000 [IU] | Freq: Three times a day (TID) | SUBCUTANEOUS | Status: DC
  Administered 2015-03-30 – 2015-03-31 (×3): 4 [IU] via SUBCUTANEOUS

## 2015-03-30 MED ORDER — ENOXAPARIN SODIUM 40 MG/0.4ML ~~LOC~~ SOLN
40.0000 mg | Freq: Every day | SUBCUTANEOUS | Status: DC
  Administered 2015-03-30 – 2015-03-31 (×2): 40 mg via SUBCUTANEOUS
  Filled 2015-03-30 (×3): qty 0.4

## 2015-03-30 MED ORDER — INSULIN ASPART 100 UNIT/ML ~~LOC~~ SOLN
0.0000 [IU] | SUBCUTANEOUS | Status: DC
  Administered 2015-03-30: 8 [IU] via SUBCUTANEOUS
  Administered 2015-03-30 – 2015-03-31 (×4): 2 [IU] via SUBCUTANEOUS

## 2015-03-30 MED ORDER — METOPROLOL TARTRATE 25 MG PO TABS
25.0000 mg | ORAL_TABLET | Freq: Two times a day (BID) | ORAL | Status: DC
  Administered 2015-03-31 (×3): 25 mg via ORAL
  Filled 2015-03-30 (×5): qty 1

## 2015-03-30 MED ORDER — INSULIN DETEMIR 100 UNIT/ML ~~LOC~~ SOLN
40.0000 [IU] | Freq: Once | SUBCUTANEOUS | Status: AC
  Administered 2015-03-30: 40 [IU] via SUBCUTANEOUS
  Filled 2015-03-30: qty 0.4

## 2015-03-30 MED ORDER — AMITRIPTYLINE HCL 50 MG PO TABS
50.0000 mg | ORAL_TABLET | Freq: Every day | ORAL | Status: DC
  Administered 2015-03-30 – 2015-04-02 (×4): 50 mg via ORAL
  Filled 2015-03-30 (×5): qty 1

## 2015-03-30 MED ORDER — POTASSIUM CHLORIDE 10 MEQ/50ML IV SOLN
10.0000 meq | INTRAVENOUS | Status: AC
  Administered 2015-03-30 (×2): 10 meq via INTRAVENOUS

## 2015-03-30 MED ORDER — FUROSEMIDE 10 MG/ML IJ SOLN
40.0000 mg | Freq: Once | INTRAMUSCULAR | Status: AC
  Administered 2015-03-30: 40 mg via INTRAVENOUS
  Filled 2015-03-30: qty 4

## 2015-03-30 MED ORDER — INSULIN DETEMIR 100 UNIT/ML ~~LOC~~ SOLN
40.0000 [IU] | Freq: Every day | SUBCUTANEOUS | Status: DC
  Administered 2015-03-31: 40 [IU] via SUBCUTANEOUS
  Filled 2015-03-30: qty 0.4

## 2015-03-30 MED ORDER — CLOPIDOGREL BISULFATE 75 MG PO TABS
75.0000 mg | ORAL_TABLET | Freq: Every day | ORAL | Status: DC
  Administered 2015-03-30 – 2015-04-03 (×5): 75 mg via ORAL
  Filled 2015-03-30 (×5): qty 1

## 2015-03-30 MED ORDER — METOPROLOL TARTRATE 25 MG/10 ML ORAL SUSPENSION
25.0000 mg | Freq: Two times a day (BID) | ORAL | Status: DC
  Filled 2015-03-30 (×5): qty 10

## 2015-03-30 NOTE — Procedures (Signed)
Extubation Procedure Note  Patient Details:   Name: Randall Wilkins DOB: 1941-08-22 MRN: [id number]   Airway Documentation:     Evaluation  O2 sats: stable throughout Complications: No apparent complications Patient did tolerate procedure well. Bilateral Breath Sounds: Clear, Diminished   Yes  [name] [facility] 03/30/2015, 12:27 AM

## 2015-03-30 NOTE — Progress Notes (Signed)
      [address]       [place] [address]             [phone]      Up in chair  BP 149/61 mmHg  Pulse 70  Temp(Src) 97.9 F (36.6 C) (Oral)  Resp 33  Ht 5\' 9"  (1.753 m)  Wt 263 lb 10.7 oz (119.6 kg)  BMI 38.92 kg/m2  SpO2 96%   Intake/Output Summary (Last 24 hours) at 03/30/15 1923 Last data filed at 03/30/15 1800  Gross per 24 hour  Intake 4306.15 ml  Output   2795 ml  Net 1511.15 ml   Creatinine up slightly at 1.40, K= 3.9 good UO Hct 29  Doing well POD # 1  Saaya Procell C. Roxan Hockey, MD [facility] [phone]

## 2015-03-30 NOTE — Progress Notes (Signed)
NIF -30, VC .75 extubated to BIPAP 10/5 40% for OSA. IS 500

## 2015-03-31 ENCOUNTER — Inpatient Hospital Stay (HOSPITAL_COMMUNITY): Payer: Medicare Other

## 2015-03-31 LAB — CBC
HCT: 29.1 % — ABNORMAL LOW (ref 39.0–52.0)
Hemoglobin: 9.7 g/dL — ABNORMAL LOW (ref 13.0–17.0)
MCH: 29.8 pg (ref 26.0–34.0)
MCHC: 33.3 g/dL (ref 30.0–36.0)
MCV: 89.5 fL (ref 78.0–100.0)
Platelets: 161 10*3/uL (ref 150–400)
RBC: 3.25 MIL/uL — ABNORMAL LOW (ref 4.22–5.81)
RDW: 15.6 % — ABNORMAL HIGH (ref 11.5–15.5)
WBC: 16.3 10*3/uL — ABNORMAL HIGH (ref 4.0–10.5)

## 2015-03-31 LAB — GLUCOSE, CAPILLARY
Glucose-Capillary: 106 mg/dL — ABNORMAL HIGH (ref 70–99)
Glucose-Capillary: 126 mg/dL — ABNORMAL HIGH (ref 70–99)
Glucose-Capillary: 132 mg/dL — ABNORMAL HIGH (ref 70–99)
Glucose-Capillary: 157 mg/dL — ABNORMAL HIGH (ref 70–99)
Glucose-Capillary: 204 mg/dL — ABNORMAL HIGH (ref 70–99)
Glucose-Capillary: 221 mg/dL — ABNORMAL HIGH (ref 70–99)

## 2015-03-31 LAB — BASIC METABOLIC PANEL
Anion gap: 7 (ref 5–15)
BUN: 12 mg/dL (ref 6–23)
CO2: 25 mmol/L (ref 19–32)
Calcium: 7.8 mg/dL — ABNORMAL LOW (ref 8.4–10.5)
Chloride: 104 mmol/L (ref 96–112)
Creatinine, Ser: 1.31 mg/dL (ref 0.50–1.35)
GFR calc Af Amer: 60 mL/min — ABNORMAL LOW (ref >90)
GFR calc non Af Amer: 52 mL/min — ABNORMAL LOW (ref >90)
Glucose, Bld: 122 mg/dL — ABNORMAL HIGH (ref 70–99)
Potassium: 3.7 mmol/L (ref 3.5–5.1)
Sodium: 136 mmol/L (ref 135–145)

## 2015-03-31 MED ORDER — POTASSIUM CHLORIDE CRYS ER 20 MEQ PO TBCR
20.0000 meq | EXTENDED_RELEASE_TABLET | Freq: Two times a day (BID) | ORAL | Status: DC
  Administered 2015-03-31 (×2): 20 meq via ORAL
  Filled 2015-03-31 (×4): qty 1

## 2015-03-31 MED ORDER — INSULIN ASPART 100 UNIT/ML ~~LOC~~ SOLN
6.0000 [IU] | Freq: Three times a day (TID) | SUBCUTANEOUS | Status: DC
  Administered 2015-03-31 (×2): 6 [IU] via SUBCUTANEOUS

## 2015-03-31 MED ORDER — INSULIN DETEMIR 100 UNIT/ML ~~LOC~~ SOLN
50.0000 [IU] | Freq: Every day | SUBCUTANEOUS | Status: DC
  Filled 2015-03-31: qty 0.5

## 2015-03-31 MED ORDER — POTASSIUM CHLORIDE 10 MEQ/50ML IV SOLN
10.0000 meq | INTRAVENOUS | Status: AC
  Administered 2015-03-31 (×3): 10 meq via INTRAVENOUS
  Filled 2015-03-31 (×2): qty 50

## 2015-03-31 MED ORDER — FUROSEMIDE 40 MG PO TABS
40.0000 mg | ORAL_TABLET | Freq: Every day | ORAL | Status: DC
  Administered 2015-03-31: 40 mg via ORAL
  Filled 2015-03-31 (×2): qty 1

## 2015-03-31 MED ORDER — INSULIN ASPART 100 UNIT/ML ~~LOC~~ SOLN
0.0000 [IU] | Freq: Every day | SUBCUTANEOUS | Status: DC
Start: 2015-03-31 — End: 2015-04-03
  Administered 2015-04-01: 2 [IU] via SUBCUTANEOUS

## 2015-03-31 MED ORDER — IPRATROPIUM-ALBUTEROL 0.5-2.5 (3) MG/3ML IN SOLN
3.0000 mL | Freq: Four times a day (QID) | RESPIRATORY_TRACT | Status: DC
  Administered 2015-03-31 – 2015-04-01 (×4): 3 mL via RESPIRATORY_TRACT
  Filled 2015-03-31 (×4): qty 3

## 2015-03-31 MED ORDER — INSULIN ASPART 100 UNIT/ML ~~LOC~~ SOLN
0.0000 [IU] | Freq: Three times a day (TID) | SUBCUTANEOUS | Status: DC
  Administered 2015-03-31 (×2): 7 [IU] via SUBCUTANEOUS
  Administered 2015-04-01: 2 [IU] via SUBCUTANEOUS
  Administered 2015-04-01 – 2015-04-03 (×3): 4 [IU] via SUBCUTANEOUS

## 2015-03-31 NOTE — Progress Notes (Signed)
      [address]       [place] [address]             [phone]       No complaints  BP 136/46 mmHg  Pulse 68  Temp(Src) 97.7 F (36.5 C) (Oral)  Resp 20  Ht 5\' 9"  (1.753 m)  Wt 268 lb 11.9 oz (121.9 kg)  BMI 39.67 kg/m2  SpO2 96%   Intake/Output Summary (Last 24 hours) at 03/31/15 1836 Last data filed at 03/31/15 1800  Gross per 24 hour  Intake   1525 ml  Output   1650 ml  Net   -125 ml   CBG (last 3)   Recent Labs  03/31/15 0812 03/31/15 1156 03/31/15 1631  GLUCAP 126* 204* 221*    CBG still elevated despite increased levimir and meal coverage- monitor and adjust as needed  Remo Lipps C. Roxan Hockey, MD [facility] [phone]

## 2015-03-31 NOTE — Progress Notes (Signed)
2 Days Post-Op Procedure(s) (LRB): CORONARY ARTERY BYPASS GRAFTING TIMES TWO USING LEFT INTERNAL MAMMARY ARTERY AND RIGHT LEG GREATER SAPHENOUS VEIN HARVESTED ENDOSCOPICALLY. (N/A) Subjective: No complaints, ambulated around unit this AM  Objective: Vital signs in last 24 hours: Temp:  [96.7 F (35.9 C)-98.7 F (37.1 C)] 98.1 F (36.7 C) (04/17 0700) Pulse Rate:  [60-79] 64 (04/17 0700) Cardiac Rhythm:  [-] Normal sinus rhythm;Heart block (04/17 0400) Resp:  [16-36] 29 (04/17 0700) BP: (94-162)/(42-86) 125/58 mmHg (04/17 0700) SpO2:  [88 %-98 %] 96 % (04/17 0700) Weight:  [268 lb 11.9 oz (121.9 kg)] 268 lb 11.9 oz (121.9 kg) (04/17 1497)  Hemodynamic parameters for last 24 hours:    Intake/Output from previous day: 04/16 0701 - 04/17 0700 In: 2349.1 [P.O.:1440; I.V.:559.1; IV Piggyback:350] Out: 2320 [Urine:2190; Chest Tube:130] Intake/Output this shift:    General appearance: alert and no distress Neurologic: intact Heart: regular rate and rhythm Lungs: wheezes bilaterally Abdomen: distended, tympanitic, nontender, +BS  Lab Results:  Recent Labs  03/30/15 1630 03/30/15 1631 03/31/15 0355  WBC 16.2*  --  16.3*  HGB 10.0* 9.9* 9.7*  HCT 29.5* 29.0* 29.1*  PLT 175  --  161   BMET:  Recent Labs  03/30/15 0300  03/30/15 1631 03/31/15 0355  NA 140  --  137 136  K 4.5  --  3.9 3.7  CL 111  --  102 104  CO2 22  --   --  25  GLUCOSE 147*  --  156* 122*  BUN 13  --  13 12  CREATININE 1.27  < > 1.40* 1.31  CALCIUM 7.5*  --   --  7.8*  < > = values in this interval not displayed.  PT/INR:  Recent Labs  03/29/15 1845  LABPROT 16.6*  INR 1.33   ABG    Component Value Date/Time   PHART 7.314* 03/30/2015 0110   HCO3 22.8 03/30/2015 0110   TCO2 19 03/30/2015 1631   ACIDBASEDEF 3.0* 03/30/2015 0110   O2SAT 96.0 03/30/2015 0110   CBG (last 3)   Recent Labs  03/31/15 0031 03/31/15 0355 03/31/15 0812  GLUCAP 132* 106* 126*    Assessment/Plan: S/P  Procedure(s) (LRB): CORONARY ARTERY BYPASS GRAFTING TIMES TWO USING LEFT INTERNAL MAMMARY ARTERY AND RIGHT LEG GREATER SAPHENOUS VEIN HARVESTED ENDOSCOPICALLY. (N/A) -  Continues to do well  CV- stable in SR  RESP- he is wheezing this AM- will add combivent MDI  IS for atelectasis  Diuresis  RENAL- creatinine better this AM  Diurese, supplement K  ENDO- CBG running a little high- will increase levimir and meal coverage  CBG and SSI AC and HS  Anemia secondary to ABL- mild, follow  No signs of GI bleed  On enoxaparin + SCD for DVT prophylaxis  ON Rocephin for preop UTI   LOS: 10 days    Melrose Nakayama 03/31/2015

## 2015-04-01 ENCOUNTER — Inpatient Hospital Stay (HOSPITAL_COMMUNITY): Payer: Medicare Other

## 2015-04-01 ENCOUNTER — Encounter (HOSPITAL_COMMUNITY): Payer: Medicare Other

## 2015-04-01 ENCOUNTER — Encounter (HOSPITAL_COMMUNITY): Payer: Self-pay | Admitting: Thoracic Surgery (Cardiothoracic Vascular Surgery)

## 2015-04-01 DIAGNOSIS — Z951 Presence of aortocoronary bypass graft: Secondary | ICD-10-CM

## 2015-04-01 LAB — BASIC METABOLIC PANEL
Anion gap: 8 (ref 5–15)
BUN: 12 mg/dL (ref 6–23)
CO2: 25 mmol/L (ref 19–32)
Calcium: 7.6 mg/dL — ABNORMAL LOW (ref 8.4–10.5)
Chloride: 104 mmol/L (ref 96–112)
Creatinine, Ser: 1.28 mg/dL (ref 0.50–1.35)
GFR calc Af Amer: 62 mL/min — ABNORMAL LOW (ref >90)
GFR calc non Af Amer: 53 mL/min — ABNORMAL LOW (ref >90)
Glucose, Bld: 174 mg/dL — ABNORMAL HIGH (ref 70–99)
Potassium: 4.1 mmol/L (ref 3.5–5.1)
Sodium: 137 mmol/L (ref 135–145)

## 2015-04-01 LAB — CBC
HCT: 28.7 % — ABNORMAL LOW (ref 39.0–52.0)
Hemoglobin: 9.6 g/dL — ABNORMAL LOW (ref 13.0–17.0)
MCH: 29.8 pg (ref 26.0–34.0)
MCHC: 33.4 g/dL (ref 30.0–36.0)
MCV: 89.1 fL (ref 78.0–100.0)
Platelets: 191 10*3/uL (ref 150–400)
RBC: 3.22 MIL/uL — ABNORMAL LOW (ref 4.22–5.81)
RDW: 15.5 % (ref 11.5–15.5)
WBC: 16.3 10*3/uL — ABNORMAL HIGH (ref 4.0–10.5)

## 2015-04-01 LAB — GLUCOSE, CAPILLARY
Glucose-Capillary: 165 mg/dL — ABNORMAL HIGH (ref 70–99)
Glucose-Capillary: 151 mg/dL — ABNORMAL HIGH (ref 70–99)
Glucose-Capillary: 245 mg/dL — ABNORMAL HIGH (ref 70–99)

## 2015-04-01 MED ORDER — SODIUM CHLORIDE 0.9 % IJ SOLN
3.0000 mL | Freq: Two times a day (BID) | INTRAMUSCULAR | Status: DC
  Administered 2015-04-01 – 2015-04-02 (×3): 3 mL via INTRAVENOUS

## 2015-04-01 MED ORDER — MOVING RIGHT ALONG BOOK
Freq: Once | Status: AC
  Administered 2015-04-01: 08:00:00
  Filled 2015-04-01: qty 1

## 2015-04-01 MED ORDER — PANTOPRAZOLE SODIUM 40 MG PO TBEC
40.0000 mg | DELAYED_RELEASE_TABLET | Freq: Two times a day (BID) | ORAL | Status: DC
  Administered 2015-04-01 – 2015-04-03 (×5): 40 mg via ORAL
  Filled 2015-04-01 (×5): qty 1

## 2015-04-01 MED ORDER — METOPROLOL SUCCINATE ER 25 MG PO TB24
25.0000 mg | ORAL_TABLET | Freq: Every day | ORAL | Status: DC
  Administered 2015-04-01: 25 mg via ORAL
  Filled 2015-04-01 (×2): qty 1

## 2015-04-01 MED ORDER — POTASSIUM CHLORIDE CRYS ER 20 MEQ PO TBCR: 20.0000 meq | EXTENDED_RELEASE_TABLET | Freq: Every day | ORAL | Status: DC

## 2015-04-01 MED ORDER — TORSEMIDE 20 MG PO TABS
50.0000 mg | ORAL_TABLET | Freq: Two times a day (BID) | ORAL | Status: DC
  Administered 2015-04-01 (×2): 50 mg via ORAL
  Filled 2015-04-01 (×5): qty 1

## 2015-04-01 MED ORDER — SODIUM CHLORIDE 0.9 % IJ SOLN
10.0000 mL | INTRAMUSCULAR | Status: DC | PRN
  Administered 2015-04-01 – 2015-04-03 (×3): 10 mL
  Filled 2015-04-01 (×3): qty 40

## 2015-04-01 MED ORDER — TAMSULOSIN HCL 0.4 MG PO CAPS
0.4000 mg | ORAL_CAPSULE | Freq: Every day | ORAL | Status: DC
  Administered 2015-04-01 – 2015-04-02 (×2): 0.4 mg via ORAL
  Filled 2015-04-01 (×4): qty 1

## 2015-04-01 MED ORDER — SODIUM CHLORIDE 0.9 % IV SOLN: 250.0000 mL | INTRAVENOUS | Status: DC | PRN

## 2015-04-01 MED ORDER — SODIUM CHLORIDE 0.9 % IJ SOLN: 3.0000 mL | INTRAMUSCULAR | Status: DC | PRN

## 2015-04-01 MED ORDER — LISINOPRIL 10 MG PO TABS
10.0000 mg | ORAL_TABLET | Freq: Every day | ORAL | Status: DC
  Administered 2015-04-01 – 2015-04-03 (×3): 10 mg via ORAL
  Filled 2015-04-01 (×3): qty 1

## 2015-04-01 MED ORDER — POTASSIUM CHLORIDE CRYS ER 20 MEQ PO TBCR
20.0000 meq | EXTENDED_RELEASE_TABLET | Freq: Two times a day (BID) | ORAL | Status: DC
Start: 2015-04-01 — End: 2015-04-02
  Administered 2015-04-01 (×2): 20 meq via ORAL
  Filled 2015-04-01 (×3): qty 1

## 2015-04-01 MED ORDER — INSULIN DETEMIR 100 UNIT/ML ~~LOC~~ SOLN
30.0000 [IU] | Freq: Two times a day (BID) | SUBCUTANEOUS | Status: DC
  Administered 2015-04-01 – 2015-04-03 (×4): 30 [IU] via SUBCUTANEOUS
  Filled 2015-04-01 (×6): qty 0.3

## 2015-04-01 MED ORDER — TORSEMIDE 20 MG PO TABS
50.0000 mg | ORAL_TABLET | Freq: Every day | ORAL | Status: DC
  Filled 2015-04-01: qty 1

## 2015-04-01 MED ORDER — INSULIN ASPART 100 UNIT/ML ~~LOC~~ SOLN
15.0000 [IU] | Freq: Three times a day (TID) | SUBCUTANEOUS | Status: DC
  Administered 2015-04-01 – 2015-04-03 (×6): 15 [IU] via SUBCUTANEOUS

## 2015-04-01 MED ORDER — IPRATROPIUM-ALBUTEROL 0.5-2.5 (3) MG/3ML IN SOLN: 3.0000 mL | Freq: Four times a day (QID) | RESPIRATORY_TRACT | Status: DC | PRN

## 2015-04-01 MED ORDER — ALLOPURINOL 300 MG PO TABS
300.0000 mg | ORAL_TABLET | Freq: Every day | ORAL | Status: DC
  Administered 2015-04-01 – 2015-04-03 (×3): 300 mg via ORAL
  Filled 2015-04-01 (×3): qty 1

## 2015-04-01 MED FILL — Electrolyte-R (PH 7.4) Solution: INTRAVENOUS | Qty: 4000 | Status: AC

## 2015-04-01 MED FILL — Sodium Bicarbonate IV Soln 8.4%: INTRAVENOUS | Qty: 50 | Status: AC

## 2015-04-01 MED FILL — Lidocaine HCl IV Inj 20 MG/ML: INTRAVENOUS | Qty: 5 | Status: AC

## 2015-04-01 MED FILL — Heparin Sodium (Porcine) Inj 1000 Unit/ML: INTRAMUSCULAR | Qty: 10 | Status: AC

## 2015-04-01 MED FILL — Sodium Chloride IV Soln 0.9%: INTRAVENOUS | Qty: 2000 | Status: AC

## 2015-04-01 MED FILL — Mannitol IV Soln 20%: INTRAVENOUS | Qty: 500 | Status: AC

## 2015-04-01 NOTE — Progress Notes (Signed)
RT placed patient on CPAP of 12cmH2O via FFM with 2 l of O2 bled in due to the patient stated that the [place] didn't feel like he had enough pressure. Patient is tolerating the CPAP with no complications at this time. RT will continue to monitor.    04/01/15 2200  BiPAP/CPAP/SIPAP  BiPAP/CPAP/SIPAP Pt Type Adult  Mask Type Full face mask  Mask Size Large  Respiratory Rate 20 breaths/min  EPAP 12 cmH2O  Oxygen Percent 28 %  Flow Rate 2 lpm  BiPAP/CPAP/SIPAP CPAP  Patient Home Equipment No  Auto Titrate No  BiPAP/CPAP /SiPAP Vitals  Pulse Rate 77  Resp 20  SpO2 94 %  Bilateral Breath Sounds Clear;Diminished

## 2015-04-01 NOTE — Progress Notes (Signed)
Pt transferred from [place]. Order to pull EPW, EPW pulled per protocol and as ordered. All ends intact pt reminded to lie supine approximately one hour. bp 121/55 heart rate 72 will monitor patient. Mance Vallejo, Bettina Gavia RN

## 2015-04-01 NOTE — Progress Notes (Addendum)
Transfer report received from 2S at 1148 and pt arrived to the unit at 1300; pt A&O x4; denies any pain; vss; telemetry applied and verified; mid sternum incision open to air, clean, dry and intact with no drainage noted. Pacing wires taped to abd intact. Foam dsg to right elbow remains intact; bruisings to right side remains intact; no pressure ulcer noted; BLE edema noted; Pt in bed comfortably with call light within reach and wife at side. Will continue to monitor quietly. Francis Gaines Lan En[place]minger RN.

## 2015-04-01 NOTE — Progress Notes (Signed)
Medicare Important Message given? YES  Date Medicare IM given:  04/01/2015  Medicare IM given by: Jonnie Finner

## 2015-04-01 NOTE — Progress Notes (Signed)
      [address]       [place] [address]             [phone]        CARDIOTHORACIC SURGERY PROGRESS NOTE   R3 Days Post-Op Procedure(s) (LRB): CORONARY ARTERY BYPASS GRAFTING TIMES TWO USING LEFT INTERNAL MAMMARY ARTERY AND RIGHT LEG GREATER SAPHENOUS VEIN HARVESTED ENDOSCOPICALLY. (N/A)  Subjective: Feels well.  Minimal soreness in chest  Objective: Vital signs: BP Readings from Last 1 Encounters:  04/01/15 135/58   Pulse Readings from Last 1 Encounters:  04/01/15 80   Resp Readings from Last 1 Encounters:  04/01/15 33   Temp Readings from Last 1 Encounters:  04/01/15 97.4 F (36.3 C) Oral    Hemodynamics:    Physical Exam:  Rhythm:   sinus  Breath sounds: clear  Heart sounds:  RRR  Incisions:  Clean and dry  Abdomen:  Soft, non-distended, non-tender  Extremities:  Warm, well-perfused    Intake/Output from previous day: 04/17 0701 - 04/18 0700 In: 1160 [P.O.:840; I.V.:320] Out: 2070 [Urine:2070] Intake/Output this shift:    Lab Results:  CBC: Recent Labs  03/31/15 0355 04/01/15 0352  WBC 16.3* 16.3*  HGB 9.7* 9.6*  HCT 29.1* 28.7*  PLT 161 191    BMET:  Recent Labs  03/31/15 0355 04/01/15 0352  NA 136 137  K 3.7 4.1  CL 104 104  CO2 25 25  GLUCOSE 122* 174*  BUN 12 12  CREATININE 1.31 1.28  CALCIUM 7.8* 7.6*     CBG (last 3)   Recent Labs  03/31/15 1156 03/31/15 1631 03/31/15 2152  GLUCAP 204* 221* 157*    ABG    Component Value Date/Time   PHART 7.314* 03/30/2015 0110   PCO2ART 45.1* 03/30/2015 0110   PO2ART 95.0 03/30/2015 0110   HCO3 22.8 03/30/2015 0110   TCO2 19 03/30/2015 1631   ACIDBASEDEF 3.0* 03/30/2015 0110   O2SAT 96.0 03/30/2015 0110    CXR: PORTABLE CHEST - 1 VIEW  COMPARISON: Portable chest x-ray of March 31, 2015  FINDINGS: The lungs are borderline hypoinflated. The retrocardiac region on the left remains dense and the hemidiaphragm remains obscured. There is minimal  blunting of the costophrenic angles. The cardiac silhouette is mildly enlarged though stable. The central pulmonary vascularity is less engorged. There are 8 intact sternal wires. The right internal jugular venous catheters are unchanged in position.  IMPRESSION: Slight interval improvement in the appearance of the pulmonary interstitium. There remains left lower lobe atelectasis and trace bilateral pleural effusions.   Electronically Signed  By: David Martinique  On: 04/01/2015 07:42   Assessment/Plan: S/P Procedure(s) (LRB): CORONARY ARTERY BYPASS GRAFTING TIMES TWO USING LEFT INTERNAL MAMMARY ARTERY AND RIGHT LEG GREATER SAPHENOUS VEIN HARVESTED ENDOSCOPICALLY. (N/A)  Doing well POD3 Maintaining NSR w/ stable BP Ambulating around SICU - overall progressing well Pre-op acute non-STEMI Pre-op upper GI bleed w/ active peptic ulcer disease Pre-op E coli UTI on IV Rocephin  Expected post op acute blood loss anemia, stable Expected post op volume excess, mild, diuresing, still several kg above preop weight Expected post op atelectasis, mild Type II diabetes mellitus, improving glycemic control but CBG's still high   Mobilize  Continue bid diuretic for another day or two  Resume home dosage of insulin  Transfer 2W   Rexene Alberts 04/01/2015 8:13 AM

## 2015-04-01 NOTE — Plan of Care (Signed)
Problem: Phase I - Pre-Op Goal: Point person for discharge identified Outcome: Completed/Met Date Met:  04/01/15 Wife

## 2015-04-01 NOTE — Progress Notes (Signed)
CARDIAC REHAB PHASE I   PRE:  Rate/Rhythm: 73 SR  BP:  Supine:   Sitting: 118/50  Standing:    SaO2: 90-92% 1L, RA same at 90-92%  MODE:  Ambulation: 460 ft   POST:  Rate/Rhythm: 97 SR  BP:  Supine:   Sitting: 132/60  Standing:    SaO2: 89-91% RA  To 94%  With rest 1300-1400 Pt walked 460 ft on RA with rolling walker and asst x 1 after going to bathroom. Pt stopped three times to rest. Tolerated well. Left off oxygen and notified RN. Back to bed for pacing wire removal.   Graylon Good, RN BSN  04/01/2015 1:55 PM

## 2015-04-02 ENCOUNTER — Inpatient Hospital Stay (HOSPITAL_COMMUNITY): Payer: Medicare Other

## 2015-04-02 LAB — TYPE AND SCREEN
ABO/RH(D): O NEG
Antibody Screen: NEGATIVE
Unit division: 0
Unit division: 0
Unit division: 0
Unit division: 0
Unit division: 0
Unit division: 0

## 2015-04-02 LAB — BASIC METABOLIC PANEL
Anion gap: 9 (ref 5–15)
BUN: 16 mg/dL (ref 6–23)
CO2: 30 mmol/L (ref 19–32)
Calcium: 8.1 mg/dL — ABNORMAL LOW (ref 8.4–10.5)
Chloride: 101 mmol/L (ref 96–112)
Creatinine, Ser: 1.41 mg/dL — ABNORMAL HIGH (ref 0.50–1.35)
GFR calc Af Amer: 55 mL/min — ABNORMAL LOW (ref >90)
GFR calc non Af Amer: 48 mL/min — ABNORMAL LOW (ref >90)
Glucose, Bld: 123 mg/dL — ABNORMAL HIGH (ref 70–99)
Potassium: 3.6 mmol/L (ref 3.5–5.1)
Sodium: 140 mmol/L (ref 135–145)

## 2015-04-02 LAB — GLUCOSE, CAPILLARY
Glucose-Capillary: 253 mg/dL — ABNORMAL HIGH (ref 70–99)
Glucose-Capillary: 104 mg/dL — ABNORMAL HIGH (ref 70–99)
Glucose-Capillary: 158 mg/dL — ABNORMAL HIGH (ref 70–99)
Glucose-Capillary: 111 mg/dL — ABNORMAL HIGH (ref 70–99)

## 2015-04-02 LAB — CBC
HCT: 29.3 % — ABNORMAL LOW (ref 39.0–52.0)
Hemoglobin: 9.8 g/dL — ABNORMAL LOW (ref 13.0–17.0)
MCH: 29.7 pg (ref 26.0–34.0)
MCHC: 33.4 g/dL (ref 30.0–36.0)
MCV: 88.8 fL (ref 78.0–100.0)
Platelets: 230 10*3/uL (ref 150–400)
RBC: 3.3 MIL/uL — ABNORMAL LOW (ref 4.22–5.81)
RDW: 15.4 % (ref 11.5–15.5)
WBC: 12 10*3/uL — ABNORMAL HIGH (ref 4.0–10.5)

## 2015-04-02 MED ORDER — TORSEMIDE 20 MG PO TABS
50.0000 mg | ORAL_TABLET | Freq: Every day | ORAL | Status: DC
  Administered 2015-04-03: 50 mg via ORAL
  Filled 2015-04-02: qty 1

## 2015-04-02 MED ORDER — METOPROLOL SUCCINATE ER 50 MG PO TB24
50.0000 mg | ORAL_TABLET | Freq: Every day | ORAL | Status: DC
  Administered 2015-04-02 – 2015-04-03 (×2): 50 mg via ORAL
  Filled 2015-04-02 (×2): qty 1

## 2015-04-02 MED ORDER — POTASSIUM CHLORIDE CRYS ER 20 MEQ PO TBCR
20.0000 meq | EXTENDED_RELEASE_TABLET | Freq: Every day | ORAL | Status: DC
  Administered 2015-04-02 – 2015-04-03 (×2): 20 meq via ORAL
  Filled 2015-04-02 (×2): qty 1

## 2015-04-02 MED FILL — Heparin Sodium (Porcine) Inj 1000 Unit/ML: INTRAMUSCULAR | Qty: 30 | Status: AC

## 2015-04-02 MED FILL — Potassium Chloride Inj 2 mEq/ML: INTRAVENOUS | Qty: 40 | Status: AC

## 2015-04-02 MED FILL — Magnesium Sulfate Inj 50%: INTRAMUSCULAR | Qty: 10 | Status: AC

## 2015-04-02 NOTE — Progress Notes (Signed)
Physician Discharge Summary       [address]       [place] [address]             [phone]    Patient ID: Randall Wilkins MRN: [id number] DOB/AGE: Oct 25, 1941 74 y.o.  Admit date: 03/21/2015 Discharge date: 04/03/2015  Admission Diagnoses: 1. S/p NSTEMI 2. History of CAD (s/p PCI with stents 11', now with critical left main disease) 3. History of hyperlipidemia 4. History of hypertension 5. History of DM 6. History of tobacco abuse 7. History of CKD (stage III) 8. History of CVA (cerebral infarction, L patietal with small scattered lacunar infarcts) 9. History of chronic diastolic congestive heart failure 10. History of exogenous obesity 11. History of aortic stenosis, mild 12. History of left foot drop 13. History of venous insufficiency 14. History of peripheral neuropathy 15. History of Rhabdomyolysis 16. History of obstructive sleep apnea 17.  Peptic ulcer with hemorrhage 18. UTI  Discharge Diagnoses:  1. S/p NSTEMI 2. History of CAD (s/p PCI with stents 36', now with critical left main disease) 3. History of hyperlipidemia 4. History of hypertension 5. History of DM 6. History of tobacco abuse 7. History of CKD (stage III) 8. History of CVA (cerebral infarction, L patietal with small scattered lacunar infarcts) 9. History of chronic diastolic congestive heart failure 10. History of exogenous obesity 11. History of aortic stenosis, mild 12. History of left foot drop 13. History of venous insufficiency 14. History of peripheral neuropathy 15. History of Rhabdomyolysis 16. History of obstructive sleep apnea 17. Peptic ulcer with hemorrhage 18. UTI 19. ABL anemia  Consults: Cardiology (Dr. Mare Ferrari), GI (Dr. Michail Sermon), CCM/Pulmonary (Dr. Halford Chessman)  Procedure (s):  1.EGD w/ biopsy by Dr. Watt Climes on 03/27/2015 2. Left Heart Cath, Selective Coronary Angiography, LV angiography by Dr. Martinique on 03/28/2015: Coronary dominance:  right  Left mainstem: 95% distal left main stenosis.  Left anterior descending (LAD): There is 95% ostial LAD stenosis. The LAD is a large vessel. The LAD and diagonal otherwise are without significant disease.  There is a large ramus intermediate branch with 99% ostial stenosis.  Left circumflex (LCx): 100% ostial occlusion with faint left to left collaterals.  Right coronary artery (RCA): the RCA is a large dominant branch. The stent in the proximal vessel is widely patent. There is a 60% stenosis in the mid PDA. The third PLOM is moderate in size with a long 90-95% stenosis prior to distal arborization.   Left ventriculography: Not done.  Final Conclusions:  1. Critical left main and three vessel obstructive CAD 2. Elevated LVEDP.  3.Emergent Coronary Artery Bypass Grafting x 2 by Dr. Roxy Manns on 03/29/2015.  History of Presenting Illness: This is a 74 year old obese white male with multiple medical problems including known history of coronary artery disease, hypertension, diabetes mellitus with complication, chronic kidney disease, chronic diastolic congestive heart failure, chronic venous insufficiency, remote history of stroke, mild chronic short term memory impairment, and peripheral neuropathy moderately decreased physical mobility who was in his usual state of health until last week when he developed fairly acute onset increased confusion, urinary frequency and a fall that resulted in a large hematoma in his left flank. The patient was taken to his primary care physician's office where he was seen by Dr. Alroy Dust and diagnosed with acute urinary tract infection. He was directly admitted to the hospital for intravenous antibiotics and further therapy on 03/21/2015. Urine culture grew greater than 100,000 colonies Escherichia coli that was  resistant to ciprofloxacin But sensitive to ceftriaxone. At the time of admission the patient had acute renal failure that was felt to be related to  dehydration with baseline chronic kidney disease, stage III. The patient initially did well but developed acute respiratory insufficiency with hypoxemia on the morning of 03/23/2015. Chest x-ray revealed some right lung opacities suggestive of possible aspiration pneumonia versus unilateral pulmonary edema. Troponin levels were markedly positive and the patient ruled in for an acute non-ST segment elevation myocardial infarction. The patient was started on intravenous heparin and cardiology consultation was obtained. The patient improved clinically within less than 24 hours. Plans for cardiac catheterization were held until the patient's renal function stabilized near his baseline. The patient had several melanotic stools while anticoagulated with heparin, aspirin and Plavix associated with a slight fall of hemoglobin from 11.6 on 03/22/2015 to 10.1 on 03/28/2015. The patient underwent upper GI endoscopy by Dr. Watt Climes on 03/27/2015 that revealed Significant ulcerations in the duodenal bulb without signs of active bleeding, distal esophageal ulcers and narrowing, and a "tiny" hiatal hernia. There was no sign of active bleeding appreciated. The patient underwent elective diagnostic cardiac catheterization This afternoon by Dr. Martinique. He was found have severe left main and three-vessel coronary artery disease. Elective cardiothoracic surgical consultation was requested.  The patient is married and lives locally in [place] with his wife. He lives a somewhat sedentary lifestyle but reports that he has been able to get around reasonably well for the last several years. His physical mobility and lifestyle are limited by the presence of chronic peripheral neuropathy, particularly related to severe weakness involving the left lower extremity that dates back to an episode of rhabdomyolysis that occurred following a fall injury in 2009. The patient has mild chronic exertional shortness of breath and moderate chronic  bilateral lower extremity edema. The patient has mild short-term memory cognitive impairment that reportedly does not limit him much. He still drives an automobile and does chores around the house. He ambulates using a cane and sometimes with a walker. He uses a wheelchair for a longer duration walks. He reportedly has been quite stable until his acute illness that began last week. He denies any previous history of exertional chest pain or chest tightness. He denies any history of PND, orthopnea, palpitations, dizzy spells, or syncope. He chronically uses CPAP at night for sleeping. Patient has critical left main coronary artery stenosis and three-vessel coronary artery disease. He suffered an acute non-ST segment elevation myocardial infarction several days ago after having originally been admitted to the hospital with acute urinary tract infection complicated by confusion and a fall. The patient has been chronically anticoagulated using Plavix for many years and additionally developed melanotic stools while anticoagulated with heparin earlier this week. Upper GI endoscopy confirmed the presence of active peptic ulcer disease without signs of ongoing bleeding. I have personally reviewed the patient's transthoracic echocardiogram and diagnostic cardiac catheterization films. Transthoracic echocardiogram revealed essentially normal left ventricular systolic function, although it was obtained prior to the patient's acute myocardial infarction. The patient also has aortic valve sclerosis with perhaps mild aortic stenosis. However, direct visual examination of the echocardiogram reveals that all 3 leaflets of the aortic valve move reasonably well. The velocity measured across the left ventricular outflow tract was elevated and the dimensionless velocity ratio between the left ventricular outflow tract and aortic valve was quite high. As a result, I do not feel the patient has significant aortic stenosis although TEE  might be helpful. Cardiac  catheterization confirms the presence of critical stenosis of the distal left main coronary artery. Dr. Roxy Manns reviewed the indications, risks, and potential benefits of coronary artery bypass grafting with the patient and his wife and son. Alternative treatment strategies have been discussed. The complicated circumstances associated with his current medical problems were reviewed at length. The high-risk associated with surgery were discussed. The patient understands and accepts all potential associated risks of surgery. He underwent emergent CABG x 2 on 03/29/2015.  Brief Hospital Course:  The patient was extubated early the morning of post operative day one without difficulty. He remained afebrile and hemodynamically stable. Gordy Councilman, a line, chest tubes, and foley were removed early in the post operative course. He was continued on Protonix 40 mg bid for PUD. Also, Rocephin was continued for a few days post op as hed a pre op UTI. Lopressor was started and titrated accordingly. He was volume over loaded and diuresed. He had ABL anemia. He did not require a post op transfusion. His last H and H was stable at 9.8 and 29.3. He was weaned off the insulin drip. The patient's HGA1C pre op was 7.7. Once he was tolerating a diet, Insulin was restarted.  The patient's glucose remained well controlled. The patient was felt surgically stable for transfer from the ICU to PCTU for further convalescence on 04/01/2015. He continues to progress with cardiac rehab. He was ambulating on room air. He has been tolerating a diet and has had a bowel movement. Epicardial pacing wires and chest tube sutures will be removed prior to discharge. The patient is felt surgically stable for discharge 04/03/2015   Latest Vital Signs: Blood pressure 120/43, pulse 71, temperature 99 F (37.2 C), temperature source Oral, resp. rate 18, height '5\' 9"'$  (1.753 m), weight 257 lb 4.8 oz (116.711 kg), SpO2 92  %.  Physical Exam: Cardiovascular: RRR Pulmonary: Clear to auscultation bilaterally; no rales, wheezes, or rhonchi. Abdomen: Soft, non tender, bowel sounds present. Extremities: Mild bilateral lower extremity edema. Wounds: Clean and dry. No erythema or signs of infection.  Discharge Condition:Stable and discharged home  Recent laboratory studies:  Lab Results  Component Value Date   WBC 12.0* 04/02/2015   HGB 9.8* 04/02/2015   HCT 29.3* 04/02/2015   MCV 88.8 04/02/2015   PLT 230 04/02/2015   Lab Results  Component Value Date   NA 140 04/03/2015   K 4.5 04/03/2015   CL 102 04/03/2015   CO2 29 04/03/2015   CREATININE 1.61* 04/03/2015   GLUCOSE 107* 04/03/2015      Diagnostic Studies: Dg Chest 2 View  04/02/2015   CLINICAL DATA:  Congestive heart failure. Open heart surgery 04/01/2015  EXAM: CHEST  2 VIEW  COMPARISON:  04/01/2015  FINDINGS: Prior CABG. Right central line remains in place, unchanged. Left lower lobe airspace opacity, likely a combination of atelectasis and left effusion. Small right effusion without confluent airspace opacity on the right. Heart is borderline in size. No pneumothorax.  IMPRESSION: Left effusion with left lower lobe opacity, likely atelectasis.   Electronically Signed   By: Rolm Baptise M.D.   On: 04/02/2015 08:15   US Renal  03/24/2015   CLINICAL DATA:  74 year old male with acute renal failure.  EXAM: RENAL/URINARY TRACT ULTRASOUND COMPLETE  COMPARISON:  None.  FINDINGS: Right Kidney:  Length: 10.2 cm. A 3.8 x 3.9 cm lower pole cyst is present. Echogenicity within normal limits. No mass or hydronephrosis visualized.  Left Kidney:  Length: 11.5 cm. Echogenicity within  normal limits. No mass or hydronephrosis visualized.  Bladder:  Appears normal for degree of bladder distention.  IMPRESSION: Right renal cyst, otherwise unremarkable renal ultrasound. No evidence of hydronephrosis.   Electronically Signed   By: Margarette Canada M.D.   On: 03/24/2015 12:18   Discharge Instructions    Amb Referral to Cardiac Rehabilitation    Complete by:  As directed            Discharge Medications:   The patient has been discharged on:   1.Beta Blocker:  Yes [ x  ]                              No   [   ]                              If No, reason:  2.Ace Inhibitor/ARB: Yes [ x  ]                                     No  [    ]                                     If No, reason:  3.Statin:   Yes [ x  ]                  No  [   ]                  If No, reason:  4.Ecasa:  Yes  [ x ]                  No   [   ]                  If No, reason:    Medication List    STOP taking these medications        amLODipine 5 MG tablet  Commonly known as:  NORVASC      TAKE these medications        allopurinol 100 MG tablet  Commonly known as:  ZYLOPRIM  Take 300 mg by mouth daily.     amitriptyline 50 MG tablet  Commonly known as:  ELAVIL  Take 50 mg by mouth at bedtime.     aspirin 325 MG EC tablet  Take 1 tablet (325 mg total) by mouth daily.     clopidogrel 75 MG tablet  Commonly known as:  PLAVIX  Take 75 mg by mouth daily.     colchicine 0.6 MG tablet  Take 0.6 mg by mouth daily as needed.     donepezil 10 MG tablet  Commonly known as:  ARICEPT  Take 1 tablet (10 mg total) by mouth daily.     fish oil-omega-3 fatty acids 1000 MG capsule  Take 1 g by mouth 2 (two) times daily.     HYDROcodone-acetaminophen 5-500 MG per tablet  Commonly known as:  VICODIN  Take 2 tablets by mouth at bedtime.     Insulin Glargine 300 UNIT/ML Sopn  Inject 60 Units into the skin every morning.     insulin lispro 100 UNIT/ML injection  Commonly known as:  HUMALOG  Inject 20-25 Units into the skin 4 (four) times daily. Takes 25u in am, 20u at lunch, 20u in evening and 25u at bedtime     lisinopril 5 MG tablet  Commonly known as:  PRINIVIL,ZESTRIL  Take 10 mg by mouth daily.     metoprolol succinate 50 MG 24 hr tablet  Commonly known as:   TOPROL-XL  Take 1 tablet (50 mg total) by mouth daily. Take with or immediately following a meal.     NON FORMULARY  Inhale 1 application into the lungs at bedtime. CPAP     pantoprazole 40 MG tablet  Commonly known as:  PROTONIX  Take 1 tablet (40 mg total) by mouth 2 (two) times daily.     potassium chloride SA 20 MEQ tablet  Commonly known as:  K-DUR,KLOR-CON  Take 20 mEq by mouth daily.     rosuvastatin 20 MG tablet  Commonly known as:  CRESTOR  Take 20 mg by mouth daily.     tamsulosin 0.4 MG Caps capsule  Commonly known as:  FLOMAX  Take 0.4 mg by mouth daily.     torsemide 10 MG tablet  Commonly known as:  DEMADEX  Take 50 mg by mouth daily.     traMADol 50 MG tablet  Commonly known as:  ULTRAM  Take 1-2 tablets (50-100 mg total) by mouth every 4 (four) hours as needed for moderate pain.         Follow Up Appointments: Follow-up Information    Follow up with Pixie Casino, MD.   Specialty:  Cardiology   Why:  Call for an appointment time for 2 weeks   Contact information:   [place] [address] [phone]       Follow up with Rexene Alberts, MD On 04/29/2015.   Specialty:  Cardiothoracic Surgery   Why:  PA/LAT CXR to be taken (at [facility] which is in the same building as Dr. Guy Sandifer office) on 04/29/2015 at 11:00 am;Appointment date and time with Dr. Roxy Manns is at 12:00 pm   Contact information:   [place] [address] [phone]       Follow up with Simona Huh, MD.   Specialty:  Family Medicine   Why:  Call for a follow up for further diabetes management and surveillnace of HGA1C 7.7   Contact information:   [address] [place] [address] [phone]       Follow up with [facility], MD.   Specialty:  Gastroenterology   Why:  Call for a follow up appointment   Contact information:   [address] [place]  [address] [phone]       Signed: Cinda Quest 04/03/2015, 11:09 AM

## 2015-04-02 NOTE — Progress Notes (Addendum)
      [address]       [place] [address]             [phone]        4 Days Post-Op Procedure(s) (LRB): CORONARY ARTERY BYPASS GRAFTING TIMES TWO USING LEFT INTERNAL MAMMARY ARTERY AND RIGHT LEG GREATER SAPHENOUS VEIN HARVESTED ENDOSCOPICALLY. (N/A)  Subjective: Patient has no complaints.  Objective: Vital signs in last 24 hours: Temp:  [97.4 F (36.3 C)-98.3 F (36.8 C)] 97.5 F (36.4 C) (04/19 0512) Pulse Rate:  [63-79] 79 (04/19 0512) Cardiac Rhythm:  [-] Normal sinus rhythm;Heart block (04/18 2007) Resp:  [14-38] 20 (04/19 0512) BP: (86-141)/(46-75) 141/56 mmHg (04/18 2100) SpO2:  [92 %-100 %] 96 % (04/19 0512) Weight:  [259 lb 11.2 oz (117.8 kg)] 259 lb 11.2 oz (117.8 kg) (04/19 0512)  Pre op weight 114 kg Current Weight  04/02/15 259 lb 11.2 oz (117.8 kg)      Intake/Output from previous day: 04/18 0701 - 04/19 0700 In: 395 [P.O.:395] Out: 2650 [Urine:2650]   Physical Exam:  Cardiovascular: RRR Pulmonary: Clear to auscultation bilaterally; no rales, wheezes, or rhonchi. Abdomen: Soft, non tender, bowel sounds present. Extremities: Mild bilateral lower extremity edema. Wounds: Clean and dry.  No erythema or signs of infection.  Lab Results: CBC: Recent Labs  04/01/15 0352 04/02/15 0415  WBC 16.3* 12.0*  HGB 9.6* 9.8*  HCT 28.7* 29.3*  PLT 191 230   BMET:  Recent Labs  04/01/15 0352 04/02/15 0415  NA 137 140  K 4.1 3.6  CL 104 101  CO2 25 30  GLUCOSE 174* 123*  BUN 12 16  CREATININE 1.28 1.41*  CALCIUM 7.6* 8.1*    PT/INR:  Lab Results  Component Value Date   INR 1.33 03/29/2015   INR 1.16 03/29/2015   INR 1.12 03/21/2015   ABG:  INR: Will add last result for INR, ABG once components are confirmed Will add last 4 CBG results once components are confirmed  Assessment/Plan:  1. CV - S/p NSTEMI. SR in the 80's. On Toprol XL 25 mg daily, Lisinopril 10 mg daily, Plavix 75 mg daily. 2.  Pulmonary - On room  air. CXR appears to show LLL atelectasis, no pneumothorax, and small bilateral pleural effusions.Encourage incentive spirometer 3. Volume Overload - On Demadex 50 mg daily pre op. Will discuss with Dr. Roxy Manns if should give Lasix or Demadex. 4.  Acute blood loss anemia - H and H stable at 9.8 and 29.3 5. Supplement potassium 6. Creatinine slightly increased from 1.28 to 1.41. Has a history of CKD (stage III). On ACE. Will discuss with Dr. Roxy Manns. Check BMET in am 7. DM-CBGs 151/245/104. On Insulin. Pre op HGA1C 7.7.  Will need follow up with medical doctor after discharge. 8. PUD- had GI bleed pre op. On Protonix 40 mg bid 9. Remove central line in am 10. Possible discharge in 1-2 days  ZIMMERMAN,DONIELLE MPA-C 04/02/2015,7:58 AM  I have seen and examined the patient and agree with the assessment and plan as outlined.  Will decrease Demadex to pre-op dose.  Increase Toprol XL to 50 mg/day.  Possible d/c home 1-2 days.    Rexene Alberts 04/02/2015 8:47 AM

## 2015-04-02 NOTE — Progress Notes (Signed)
CARDIAC REHAB PHASE I   PRE:  Rate/Rhythm: 81 SR  BP:  Supine:   Sitting: 90/50  Standing:    SaO2: 89-90%RA  MODE:  Ambulation: 450 ft   POST:  Rate/Rhythm: 101 ST  BP:  Supine:   Sitting: 150/52  Standing:    SaO2: 91%RA [id number] Pt walked 450 ft on RA with rolling walker with minimal asst. Tolerated well. Had to stop 3 or 4 times to rest. Encouraged IS and walks for pulmonary toilet. Pt is considering Phase 2 [place] as he has attended before. Will let us know prior to discharge.   Graylon Good, RN BSN  04/02/2015 9:36 AM

## 2015-04-02 NOTE — Discharge Instructions (Signed)
Activity: 1.May walk up steps °               2.No lifting more than ten pounds for four weeks.  °               3.No driving for four weeks. °               4.Stop any activity that causes chest pain, shortness of breath, dizziness, sweating or excessive weakness. °               5.Avoid straining. °               6.Continue with your breathing exercises daily. ° °Diet: Diabetic diet and Low fat, Low salt diet ° °Wound Care: May shower.  Clean wounds with mild soap and water daily. Contact the office at [phone] if any problems arise. ° °Coronary Artery Bypass Grafting, Care After °Refer to this sheet in the next few weeks. These instructions provide you with information on caring for yourself after your procedure. Your health care provider may also give you more specific instructions. Your treatment has been planned according to current medical practices, but problems sometimes occur. Call your health care provider if you have any problems or questions after your procedure. °WHAT TO EXPECT AFTER THE PROCEDURE °Recovery from surgery will be different for everyone. Some people feel well after 3 or 4 weeks, while for others it takes longer. After your procedure, it is typical to have the following: °· Nausea and a lack of appetite.   °· Constipation. °· Weakness and fatigue.   °· Depression or irritability.   °· Pain or discomfort at your incision site. °HOME CARE INSTRUCTIONS °· Take medicines only as directed by your health care provider. Do not stop taking medicines or start any new medicines without first checking with your health care provider. °· Take your pulse as directed by your health care provider. °· Perform deep breathing as directed by your health care provider. If you were given a device called an incentive spirometer, use it to practice deep breathing several times a day. Support your chest with a pillow or your arms when you take deep breaths or cough. °· Keep incision areas clean, dry, and  protected. Remove or change any bandages (dressings) only as directed by your health care provider. You may have skin adhesive strips over the incision areas. Do not take the strips off. They will fall off on their own. °· Check incision areas daily for any swelling, redness, or drainage. °· If incisions were made in your legs, do the following: °¨ Avoid crossing your legs.   °¨ Avoid sitting for long periods of time. Change positions every 30 minutes.   °¨ Elevate your legs when you are sitting. °· Wear compression stockings as directed by your health care provider. These stockings help keep blood clots from forming in your legs. °· Take showers once your health care provider approves. Until then, only take sponge baths. Pat incisions dry. Do not rub incisions with a washcloth or towel. Do not take baths, swim, or use a hot tub until your health care provider approves. °· Eat foods that are high in fiber, such as raw fruits and vegetables, whole grains, beans, and nuts. Meats should be lean cut. Avoid canned, processed, and fried foods. °· Drink enough fluid to keep your urine clear or pale yellow. °· Weigh yourself every day. This helps identify if you are retaining fluid that may make your heart and lungs   work harder. °· Rest and limit activity as directed by your health care provider. You may be instructed to: °¨ Stop any activity at once if you have chest pain, shortness of breath, irregular heartbeats, or dizziness. Get help right away if you have any of these symptoms. °¨ Move around frequently for short periods or take short walks as directed by your health care provider. Increase your activities gradually. You may need physical therapy or cardiac rehabilitation to help strengthen your muscles and build your endurance. °¨ Avoid lifting, pushing, or pulling anything heavier than 10 lb (4.5 kg) for at least 6 weeks after surgery. °· Do not drive until your health care provider approves.  °· Ask your health  care provider when you may return to work. °· Ask your health care provider when you may resume sexual activity. °· Keep all follow-up visits as directed by your health care provider. This is important. °SEEK MEDICAL CARE IF: °· You have swelling, redness, increasing pain, or drainage at the site of an incision. °· You have a fever. °· You have swelling in your ankles or legs. °· You have pain in your legs.   °· You gain 2 or more pounds (0.9 kg) a day. °· You are nauseous or vomit. °· You have diarrhea.  °SEEK IMMEDIATE MEDICAL CARE IF: °· You have chest pain that goes to your jaw or arms. °· You have shortness of breath.   °· You have a fast or irregular heartbeat.   °· You notice a "clicking" in your breastbone (sternum) when you move.   °· You have numbness or weakness in your arms or legs. °· You feel dizzy or light-headed.   °MAKE SURE YOU: °· Understand these instructions. °· Will watch your condition. °· Will get help right away if you are not doing well or get worse. °Document Released: 06/19/2005 Document Revised: 04/16/2014 Document Reviewed: 05/09/2013 °Exit[employer]® Patient Information ©2015 [employer]. This information is not intended to replace advice given to you by your health care provider. Make sure you discuss any questions you have with your health care provider. ° ° ° °

## 2015-04-03 LAB — BASIC METABOLIC PANEL
Anion gap: 9 (ref 5–15)
BUN: 21 mg/dL (ref 6–23)
CO2: 29 mmol/L (ref 19–32)
Calcium: 8.2 mg/dL — ABNORMAL LOW (ref 8.4–10.5)
Chloride: 102 mmol/L (ref 96–112)
Creatinine, Ser: 1.61 mg/dL — ABNORMAL HIGH (ref 0.50–1.35)
GFR calc Af Amer: 47 mL/min — ABNORMAL LOW (ref >90)
GFR calc non Af Amer: 40 mL/min — ABNORMAL LOW (ref >90)
Glucose, Bld: 107 mg/dL — ABNORMAL HIGH (ref 70–99)
Potassium: 4.5 mmol/L (ref 3.5–5.1)
Sodium: 140 mmol/L (ref 135–145)

## 2015-04-03 LAB — GLUCOSE, CAPILLARY
Glucose-Capillary: 88 mg/dL (ref 70–99)
Glucose-Capillary: 192 mg/dL — ABNORMAL HIGH (ref 70–99)

## 2015-04-03 MED ORDER — BACITRACIN ZINC 500 UNIT/GM EX OINT
TOPICAL_OINTMENT | Freq: Two times a day (BID) | CUTANEOUS | Status: DC
  Administered 2015-04-03: 1 via TOPICAL
  Filled 2015-04-03: qty 28.35

## 2015-04-03 MED ORDER — PANTOPRAZOLE SODIUM 40 MG PO TBEC: 40.0000 mg | DELAYED_RELEASE_TABLET | Freq: Two times a day (BID) | ORAL | Status: DC

## 2015-04-03 MED ORDER — BACITRACIN ZINC 500 UNIT/GM EX OINT: TOPICAL_OINTMENT | Freq: Two times a day (BID) | CUTANEOUS | Status: DC

## 2015-04-03 MED ORDER — ASPIRIN 325 MG PO TBEC: 325.0000 mg | DELAYED_RELEASE_TABLET | Freq: Every day | ORAL | Status: DC

## 2015-04-03 MED ORDER — METOPROLOL SUCCINATE ER 50 MG PO TB24: 50.0000 mg | ORAL_TABLET | Freq: Every day | ORAL | Status: DC

## 2015-04-03 MED ORDER — TRAMADOL HCL 50 MG PO TABS: 50.0000 mg | ORAL_TABLET | ORAL | Status: DC | PRN

## 2015-04-03 NOTE — Progress Notes (Signed)
0930-1000 Pt and wife state pt going home. Education completed with them. Understanding voiced. Gave diabetic and heart healthy diets and briefly reviewed carb counting. Pt has attended CRP 2 before and considering doing again. Will refer to [facility] program. Put on discharge video for them to watch. Pt has rolling walker at home. Graylon Good RN BSN 04/03/2015 10:00 AM

## 2015-04-03 NOTE — Progress Notes (Signed)
TCTS BRIEF PROGRESS NOTE   Doing well Plan d/c home today  Rexene Alberts 04/03/2015 8:45 AM

## 2015-04-03 NOTE — Progress Notes (Signed)
Pt currently in AFib.  EKG done to confirm.  Dr. Servando Snare called and notified.  Pt is asymptomatic, HR 65-75, BP 122/54.  No new orders at this time.  RN will continue to monitor pt closely.  Claudette Stapler, RN

## 2015-04-03 NOTE — Discharge Summary (Signed)
Freddrick March, PA-C Physician Assistant Certified Signed Cardiothoracic Surgery Progress Notes 04/02/2015 8:27 AM    Expand All Collapse All   Physician Discharge Summary      [address]  [place] [address]  [phone]  Patient ID: Randall Wilkins MRN: [id number] DOB/AGE: 1941-08-06 74 y.o.  Admit date: 03/21/2015 Discharge date: 04/03/2015  Admission Diagnoses: 1. S/p NSTEMI 2. History of CAD (s/p PCI with stents 77', now with critical left main disease) 3. History of hyperlipidemia 4. History of hypertension 5. History of DM 6. History of tobacco abuse 7. History of CKD (stage III) 8. History of CVA (cerebral infarction, L patietal with small scattered lacunar infarcts) 9. History of chronic diastolic congestive heart failure 10. History of exogenous obesity 11. History of aortic stenosis, mild 12. History of left foot drop 13. History of venous insufficiency 14. History of peripheral neuropathy 15. History of Rhabdomyolysis 16. History of obstructive sleep apnea 17. Peptic ulcer with hemorrhage 18. UTI  Discharge Diagnoses:  1. S/p NSTEMI 2. History of CAD (s/p PCI with stents 28', now with critical left main disease) 3. History of hyperlipidemia 4. History of hypertension 5. History of DM 6. History of tobacco abuse 7. History of CKD (stage III) 8. History of CVA (cerebral infarction, L patietal with small scattered lacunar infarcts) 9. History of chronic diastolic congestive heart failure 10. History of exogenous obesity 11. History of aortic stenosis, mild 12. History of left foot drop 13. History of venous insufficiency 14. History of peripheral neuropathy 15. History of Rhabdomyolysis 16. History of obstructive sleep apnea 17. Peptic ulcer with hemorrhage 18. UTI 19. ABL anemia  Consults: Cardiology (Dr. Mare Ferrari), GI (Dr. Michail Sermon), CCM/Pulmonary (Dr. Halford Chessman)  Procedure (s):  1.EGD w/  biopsy by Dr. Watt Climes on 03/27/2015 2. Left Heart Cath, Selective Coronary Angiography, LV angiography by Dr. Martinique on 03/28/2015: Coronary dominance: right  Left mainstem: 95% distal left main stenosis.  Left anterior descending (LAD): There is 95% ostial LAD stenosis. The LAD is a large vessel. The LAD and diagonal otherwise are without significant disease.  There is a large ramus intermediate branch with 99% ostial stenosis.  Left circumflex (LCx): 100% ostial occlusion with faint left to left collaterals.  Right coronary artery (RCA): the RCA is a large dominant branch. The stent in the proximal vessel is widely patent. There is a 60% stenosis in the mid PDA. The third PLOM is moderate in size with a long 90-95% stenosis prior to distal arborization.   Left ventriculography: Not done.  Final Conclusions:  1. Critical left main and three vessel obstructive CAD 2. Elevated LVEDP.  3.Emergent Coronary Artery Bypass Grafting x 2 by Dr. Roxy Manns on 03/29/2015.  History of Presenting Illness: This is a 73 year old obese white male with multiple medical problems including known history of coronary artery disease, hypertension, diabetes mellitus with complication, chronic kidney disease, chronic diastolic congestive heart failure, chronic venous insufficiency, remote history of stroke, mild chronic short term memory impairment, and peripheral neuropathy moderately decreased physical mobility who was in his usual state of health until last week when he developed fairly acute onset increased confusion, urinary frequency and a fall that resulted in a large hematoma in his left flank. The patient was taken to his primary care physician's office where he was seen by Dr. Alroy Dust and diagnosed with acute urinary tract infection. He was directly admitted to the hospital for intravenous antibiotics and further therapy on 03/21/2015. Urine culture grew greater than 100,000 colonies  Escherichia coli that  was resistant to ciprofloxacin But sensitive to ceftriaxone. At the time of admission the patient had acute renal failure that was felt to be related to dehydration with baseline chronic kidney disease, stage III. The patient initially did well but developed acute respiratory insufficiency with hypoxemia on the morning of 03/23/2015. Chest x-ray revealed some right lung opacities suggestive of possible aspiration pneumonia versus unilateral pulmonary edema. Troponin levels were markedly positive and the patient ruled in for an acute non-ST segment elevation myocardial infarction. The patient was started on intravenous heparin and cardiology consultation was obtained. The patient improved clinically within less than 24 hours. Plans for cardiac catheterization were held until the patient's renal function stabilized near his baseline. The patient had several melanotic stools while anticoagulated with heparin, aspirin and Plavix associated with a slight fall of hemoglobin from 11.6 on 03/22/2015 to 10.1 on 03/28/2015. The patient underwent upper GI endoscopy by Dr. Watt Climes on 03/27/2015 that revealed Significant ulcerations in the duodenal bulb without signs of active bleeding, distal esophageal ulcers and narrowing, and a "tiny" hiatal hernia. There was no sign of active bleeding appreciated. The patient underwent elective diagnostic cardiac catheterization This afternoon by Dr. Martinique. He was found have severe left main and three-vessel coronary artery disease. Elective cardiothoracic surgical consultation was requested.  The patient is married and lives locally in [place] with his wife. He lives a somewhat sedentary lifestyle but reports that he has been able to get around reasonably well for the last several years. His physical mobility and lifestyle are limited by the presence of chronic peripheral neuropathy, particularly related to severe weakness involving the left lower extremity that dates back to an  episode of rhabdomyolysis that occurred following a fall injury in 2009. The patient has mild chronic exertional shortness of breath and moderate chronic bilateral lower extremity edema. The patient has mild short-term memory cognitive impairment that reportedly does not limit him much. He still drives an automobile and does chores around the house. He ambulates using a cane and sometimes with a walker. He uses a wheelchair for a longer duration walks. He reportedly has been quite stable until his acute illness that began last week. He denies any previous history of exertional chest pain or chest tightness. He denies any history of PND, orthopnea, palpitations, dizzy spells, or syncope. He chronically uses CPAP at night for sleeping. Patient has critical left main coronary artery stenosis and three-vessel coronary artery disease. He suffered an acute non-ST segment elevation myocardial infarction several days ago after having originally been admitted to the hospital with acute urinary tract infection complicated by confusion and a fall. The patient has been chronically anticoagulated using Plavix for many years and additionally developed melanotic stools while anticoagulated with heparin earlier this week. Upper GI endoscopy confirmed the presence of active peptic ulcer disease without signs of ongoing bleeding. I have personally reviewed the patient's transthoracic echocardiogram and diagnostic cardiac catheterization films. Transthoracic echocardiogram revealed essentially normal left ventricular systolic function, although it was obtained prior to the patient's acute myocardial infarction. The patient also has aortic valve sclerosis with perhaps mild aortic stenosis. However, direct visual examination of the echocardiogram reveals that all 3 leaflets of the aortic valve move reasonably well. The velocity measured across the left ventricular outflow tract was elevated and the dimensionless velocity ratio  between the left ventricular outflow tract and aortic valve was quite high. As a result, I do not feel the patient has significant aortic stenosis although TEE  might be helpful. Cardiac catheterization confirms the presence of critical stenosis of the distal left main coronary artery. Dr. Roxy Manns reviewed the indications, risks, and potential benefits of coronary artery bypass grafting with the patient and his wife and son. Alternative treatment strategies have been discussed. The complicated circumstances associated with his current medical problems were reviewed at length. The high-risk associated with surgery were discussed. The patient understands and accepts all potential associated risks of surgery. He underwent emergent CABG x 2 on 03/29/2015.  Brief Hospital Course:  The patient was extubated early the morning of post operative day one without difficulty. He remained afebrile and hemodynamically stable. Gordy Councilman, a line, chest tubes, and foley were removed early in the post operative course. He was continued on Protonix 40 mg bid for PUD. Also, Rocephin was continued for a few days post op as hed a pre op UTI. Lopressor was started and titrated accordingly. He was volume over loaded and diuresed. He had ABL anemia. He did not require a post op transfusion. His last H and H was stable at 9.8 and 29.3. He was weaned off the insulin drip. The patient's HGA1C pre op was 7.7. Once he was tolerating a diet, Insulin was restarted. The patient's glucose remained well controlled. The patient was felt surgically stable for transfer from the ICU to [facility]U for further convalescence on 04/01/2015. He continues to progress with cardiac rehab. He was ambulating on room air. He has been tolerating a diet and has had a bowel movement. Epicardial pacing wires and chest tube sutures will be removed prior to discharge. The patient is felt surgically stable for discharge 04/03/2015   Latest Vital Signs: Blood pressure  120/43, pulse 71, temperature 99 F (37.2 C), temperature source Oral, resp. rate 18, height '5\' 9"'$  (1.753 m), weight 257 lb 4.8 oz (116.711 kg), SpO2 92 %.  Physical Exam: Cardiovascular: RRR Pulmonary: Clear to auscultation bilaterally; no rales, wheezes, or rhonchi. Abdomen: Soft, non tender, bowel sounds present. Extremities: Mild bilateral lower extremity edema. Wounds: Clean and dry. No erythema or signs of infection.  Discharge Condition:Stable and discharged home  Recent laboratory studies:   Recent Labs    Lab Results  Component Value Date   WBC 12.0* 04/02/2015   HGB 9.8* 04/02/2015   HCT 29.3* 04/02/2015   MCV 88.8 04/02/2015   PLT 230 04/02/2015      Recent Labs    Lab Results  Component Value Date   NA 140 04/03/2015   K 4.5 04/03/2015   CL 102 04/03/2015   CO2 29 04/03/2015   CREATININE 1.61* 04/03/2015   GLUCOSE 107* 04/03/2015       Diagnostic Studies: Dg Chest 2 View  04/02/2015 CLINICAL DATA: Congestive heart failure. Open heart surgery 04/01/2015 EXAM: CHEST 2 VIEW COMPARISON: 04/01/2015 FINDINGS: Prior CABG. Right central line remains in place, unchanged. Left lower lobe airspace opacity, likely a combination of atelectasis and left effusion. Small right effusion without confluent airspace opacity on the right. Heart is borderline in size. No pneumothorax. IMPRESSION: Left effusion with left lower lobe opacity, likely atelectasis. Electronically Signed By: Rolm Baptise M.D. On: 04/02/2015 08:15   US Renal  03/24/2015 CLINICAL DATA: 74 year old male with acute renal failure. EXAM: RENAL/URINARY TRACT ULTRASOUND COMPLETE COMPARISON: None. FINDINGS: Right Kidney: Length: 10.2 cm. A 3.8 x 3.9 cm lower pole cyst is present. Echogenicity within normal limits. No mass or hydronephrosis visualized. Left Kidney: Length: 11.5 cm. Echogenicity within normal limits. No mass or hydronephrosis  visualized.  Bladder: Appears normal for degree of bladder distention. IMPRESSION: Right renal cyst, otherwise unremarkable renal ultrasound. No evidence of hydronephrosis. Electronically Signed By: Margarette Canada M.D. On: 03/24/2015 12:18  Discharge Instructions    Amb Referral to Cardiac Rehabilitation  Complete by: As directed            Discharge Medications:   The patient has been discharged on:   1.Beta Blocker: Yes [ x ]  No '[ ]'$   If No, reason:  2.Ace Inhibitor/ARB: Yes [ x ]  No '[ ]'$   If No, reason:  3.Statin: Yes [ x ]  No '[ ]'$   If No, reason:  4.Ecasa: Yes [ x ]  No '[ ]'$   If No, reason:    Medication List    STOP taking these medications       amLODipine 5 MG tablet  Commonly known as: NORVASC      TAKE these medications       allopurinol 100 MG tablet  Commonly known as: ZYLOPRIM  Take 300 mg by mouth daily.     amitriptyline 50 MG tablet  Commonly known as: ELAVIL  Take 50 mg by mouth at bedtime.     aspirin 325 MG EC tablet  Take 1 tablet (325 mg total) by mouth daily.     clopidogrel 75 MG tablet  Commonly known as: PLAVIX  Take 75 mg by mouth daily.     colchicine 0.6 MG tablet  Take 0.6 mg by mouth daily as needed.     donepezil 10 MG tablet  Commonly known as: ARICEPT  Take 1 tablet (10 mg total) by mouth daily.     fish oil-omega-3 fatty acids 1000 MG capsule  Take 1 g by mouth 2 (two) times daily.     HYDROcodone-acetaminophen 5-500 MG per tablet  Commonly known as: VICODIN  Take 2 tablets by mouth at bedtime.     Insulin Glargine 300 UNIT/ML Sopn  Inject 60 Units into the skin every morning.     insulin lispro 100 UNIT/ML injection    Commonly known as: HUMALOG  Inject 20-25 Units into the skin 4 (four) times daily. Takes 25u in am, 20u at lunch, 20u in evening and 25u at bedtime     lisinopril 5 MG tablet  Commonly known as: PRINIVIL,ZESTRIL  Take 10 mg by mouth daily.     metoprolol succinate 50 MG 24 hr tablet  Commonly known as: TOPROL-XL  Take 1 tablet (50 mg total) by mouth daily. Take with or immediately following a meal.     NON FORMULARY  Inhale 1 application into the lungs at bedtime. CPAP     pantoprazole 40 MG tablet  Commonly known as: PROTONIX  Take 1 tablet (40 mg total) by mouth 2 (two) times daily.     potassium chloride SA 20 MEQ tablet  Commonly known as: K-DUR,KLOR-CON  Take 20 mEq by mouth daily.     rosuvastatin 20 MG tablet  Commonly known as: CRESTOR  Take 20 mg by mouth daily.     tamsulosin 0.4 MG Caps capsule  Commonly known as: FLOMAX  Take 0.4 mg by mouth daily.     torsemide 10 MG tablet  Commonly known as: DEMADEX  Take 50 mg by mouth daily.     traMADol 50 MG tablet  Commonly known as: ULTRAM  Take 1-2 tablets (50-100 mg total) by mouth every 4 (four) hours as needed for moderate pain.  Follow Up Appointments: Follow-up Information    Follow up with Pixie Casino, MD.   Specialty: Cardiology   Why: Call for an appointment time for 2 weeks   Contact information:   [place] [address] [phone]       Follow up with Rexene Alberts, MD On 04/29/2015.   Specialty: Cardiothoracic Surgery   Why: PA/LAT CXR to be taken (at [facility] which is in the same building as Dr. Guy Sandifer office) on 04/29/2015 at 11:00 am;Appointment date and time with Dr. Roxy Manns is at 12:00 pm   Contact information:   [place] [address] [phone]       Follow up with Simona Huh, MD.   Specialty: Family Medicine    Why: Call for a follow up for further diabetes management and surveillnace of HGA1C 7.7   Contact information:   [address] [place] [address] [phone]       Follow up with [facility], MD.   Specialty: Gastroenterology   Why: Call for a follow up appointment   Contact information:   [address] [place] [address] [phone]       Signed: Cinda Quest 04/03/2015, 11:09 AM        Revision History     Date/Time User Provider Type Action   04/03/2015 11:10 AM Freddrick March, PA-C Physician Assistant Certified Sign   01/25/2481 9:09 AM Lake Bells Liston Alba, PA-C Physician Assistant Certified [place]   View Details Report

## 2015-04-03 NOTE — Progress Notes (Signed)
Pt discharge education and instructions completed with pt and spouse at bedside. All voices understanding denies any questions. Pt IJ and sutures removed by RN Erasmo Downer per order; pt telemetry removed; clean dsg applied to right pinky toe clean, dry and intact; pt handed his prescriptions for Lopressor, tramadol and protonix. Pt chest incision open to air, clean, dry and intact with no drainage or discharge noted. Pt discharge home with wife to transport him home. Pt transported off unit via wheelchair with spouse and belongings to the side. Francis Gaines Sandrea Boer RN.

## 2015-04-04 NOTE — Progress Notes (Signed)
Late entry from  04/03/15 1300 Pt Central line DCd as ordered and per protocol, tip intact, pressure held and dressing applied. Pt reminded to lie supine 24mnutes. Pt understood. Will continue to monitor patient. Lyfe Monger, KBettina GaviaRN

## 2015-04-05 ENCOUNTER — Emergency Department (HOSPITAL_COMMUNITY): Payer: Medicare Other

## 2015-04-05 ENCOUNTER — Encounter (HOSPITAL_COMMUNITY): Payer: Self-pay | Admitting: Emergency Medicine

## 2015-04-05 ENCOUNTER — Emergency Department (HOSPITAL_COMMUNITY)
Admission: EM | Admit: 2015-04-05 | Discharge: 2015-04-05 | Disposition: A | Discharge status: Home / Self Care | Payer: Medicare Other | Attending: Emergency Medicine | Admitting: Emergency Medicine

## 2015-04-05 DIAGNOSIS — I5032 Chronic diastolic (congestive) heart failure: Secondary | ICD-10-CM | POA: Insufficient documentation

## 2015-04-05 DIAGNOSIS — Z8673 Personal history of transient ischemic attack (TIA), and cerebral infarction without residual deficits: Secondary | ICD-10-CM | POA: Insufficient documentation

## 2015-04-05 DIAGNOSIS — Z7982 Long term (current) use of aspirin: Secondary | ICD-10-CM | POA: Diagnosis not present

## 2015-04-05 DIAGNOSIS — Z951 Presence of aortocoronary bypass graft: Secondary | ICD-10-CM | POA: Insufficient documentation

## 2015-04-05 DIAGNOSIS — Z87440 Personal history of urinary (tract) infections: Secondary | ICD-10-CM | POA: Insufficient documentation

## 2015-04-05 DIAGNOSIS — N289 Disorder of kidney and ureter, unspecified: Secondary | ICD-10-CM

## 2015-04-05 DIAGNOSIS — Z9981 Dependence on supplemental oxygen: Secondary | ICD-10-CM | POA: Insufficient documentation

## 2015-04-05 DIAGNOSIS — Z9861 Coronary angioplasty status: Secondary | ICD-10-CM | POA: Diagnosis not present

## 2015-04-05 DIAGNOSIS — I129 Hypertensive chronic kidney disease with stage 1 through stage 4 chronic kidney disease, or unspecified chronic kidney disease: Secondary | ICD-10-CM | POA: Diagnosis not present

## 2015-04-05 DIAGNOSIS — Z79899 Other long term (current) drug therapy: Secondary | ICD-10-CM | POA: Diagnosis not present

## 2015-04-05 DIAGNOSIS — N183 Chronic kidney disease, stage 3 (moderate): Secondary | ICD-10-CM | POA: Diagnosis not present

## 2015-04-05 DIAGNOSIS — Z8711 Personal history of peptic ulcer disease: Secondary | ICD-10-CM | POA: Insufficient documentation

## 2015-04-05 DIAGNOSIS — Z7902 Long term (current) use of antithrombotics/antiplatelets: Secondary | ICD-10-CM | POA: Insufficient documentation

## 2015-04-05 DIAGNOSIS — E669 Obesity, unspecified: Secondary | ICD-10-CM | POA: Diagnosis not present

## 2015-04-05 DIAGNOSIS — Z8669 Personal history of other diseases of the nervous system and sense organs: Secondary | ICD-10-CM | POA: Diagnosis not present

## 2015-04-05 DIAGNOSIS — E1042 Type 1 diabetes mellitus with diabetic polyneuropathy: Secondary | ICD-10-CM | POA: Diagnosis not present

## 2015-04-05 DIAGNOSIS — R55 Syncope and collapse: Secondary | ICD-10-CM | POA: Diagnosis not present

## 2015-04-05 DIAGNOSIS — I2510 Atherosclerotic heart disease of native coronary artery without angina pectoris: Secondary | ICD-10-CM | POA: Insufficient documentation

## 2015-04-05 DIAGNOSIS — M109 Gout, unspecified: Secondary | ICD-10-CM | POA: Diagnosis not present

## 2015-04-05 DIAGNOSIS — Z794 Long term (current) use of insulin: Secondary | ICD-10-CM | POA: Insufficient documentation

## 2015-04-05 DIAGNOSIS — Z8719 Personal history of other diseases of the digestive system: Secondary | ICD-10-CM | POA: Diagnosis not present

## 2015-04-05 DIAGNOSIS — Z87891 Personal history of nicotine dependence: Secondary | ICD-10-CM | POA: Insufficient documentation

## 2015-04-05 LAB — CBC WITH DIFFERENTIAL/PLATELET
Basophils Absolute: 0.1 10*3/uL (ref 0.0–0.1)
Basophils Relative: 0 % (ref 0–1)
Eosinophils Absolute: 0.2 10*3/uL (ref 0.0–0.7)
Eosinophils Relative: 1 % (ref 0–5)
HCT: 33.3 % — ABNORMAL LOW (ref 39.0–52.0)
Hemoglobin: 10.7 g/dL — ABNORMAL LOW (ref 13.0–17.0)
Lymphocytes Relative: 10 % — ABNORMAL LOW (ref 12–46)
Lymphs Abs: 1.3 10*3/uL (ref 0.7–4.0)
MCH: 29 pg (ref 26.0–34.0)
MCHC: 32.1 g/dL (ref 30.0–36.0)
MCV: 90.2 fL (ref 78.0–100.0)
Monocytes Absolute: 1 10*3/uL (ref 0.1–1.0)
Monocytes Relative: 8 % (ref 3–12)
Neutro Abs: 10.5 10*3/uL — ABNORMAL HIGH (ref 1.7–7.7)
Neutrophils Relative %: 81 % — ABNORMAL HIGH (ref 43–77)
Platelets: 280 10*3/uL (ref 150–400)
RBC: 3.69 MIL/uL — ABNORMAL LOW (ref 4.22–5.81)
RDW: 15.4 % (ref 11.5–15.5)
WBC: 13 10*3/uL — ABNORMAL HIGH (ref 4.0–10.5)

## 2015-04-05 LAB — URINALYSIS, ROUTINE W REFLEX MICROSCOPIC
Bilirubin Urine: NEGATIVE
Glucose, UA: NEGATIVE mg/dL
Hgb urine dipstick: NEGATIVE
Ketones, ur: NEGATIVE mg/dL
Leukocytes, UA: NEGATIVE
Nitrite: NEGATIVE
Protein, ur: NEGATIVE mg/dL
Specific Gravity, Urine: 1.009 (ref 1.005–1.030)
Urobilinogen, UA: 0.2 mg/dL (ref 0.0–1.0)
pH: 5.5 (ref 5.0–8.0)

## 2015-04-05 LAB — BASIC METABOLIC PANEL
Anion gap: 14 (ref 5–15)
BUN: 28 mg/dL — ABNORMAL HIGH (ref 6–23)
CO2: 27 mmol/L (ref 19–32)
Calcium: 8.7 mg/dL (ref 8.4–10.5)
Chloride: 99 mmol/L (ref 96–112)
Creatinine, Ser: 2.01 mg/dL — ABNORMAL HIGH (ref 0.50–1.35)
GFR calc Af Amer: 36 mL/min — ABNORMAL LOW (ref >90)
GFR calc non Af Amer: 31 mL/min — ABNORMAL LOW (ref >90)
Glucose, Bld: 145 mg/dL — ABNORMAL HIGH (ref 70–99)
Potassium: 4.1 mmol/L (ref 3.5–5.1)
Sodium: 140 mmol/L (ref 135–145)

## 2015-04-05 LAB — TROPONIN I: Troponin I: 0.08 ng/mL — ABNORMAL HIGH (ref <0.031)

## 2015-04-05 NOTE — ED Provider Notes (Signed)
Echo results reviewed.  Trivial pericardial effusion.  Dr. Alvino Chapel and Dr. Sung Amabile state patient to be discharged if echo without significant abnormality.  Patient feels well and wishes discharge.   Results for orders placed or performed during the hospital encounter of 83/25/49  Basic metabolic panel  Result Value Ref Range   Sodium 140 135 - 145 mmol/L   Potassium 4.1 3.5 - 5.1 mmol/L   Chloride 99 96 - 112 mmol/L   CO2 27 19 - 32 mmol/L   Glucose, Bld 145 (H) 70 - 99 mg/dL   BUN 28 (H) 6 - 23 mg/dL   Creatinine, Ser 2.01 (H) 0.50 - 1.35 mg/dL   Calcium 8.7 8.4 - 10.5 mg/dL   GFR calc non Af Amer 31 (L) >90 mL/min   GFR calc Af Amer 36 (L) >90 mL/min   Anion gap 14 5 - 15  Troponin I  Result Value Ref Range   Troponin I 0.08 (H) <0.031 ng/mL  Urinalysis, Routine w reflex microscopic  Result Value Ref Range   Color, Urine YELLOW YELLOW   APPearance CLEAR CLEAR   Specific Gravity, Urine 1.009 1.005 - 1.030   pH 5.5 5.0 - 8.0   Glucose, UA NEGATIVE NEGATIVE mg/dL   Hgb urine dipstick NEGATIVE NEGATIVE   Bilirubin Urine NEGATIVE NEGATIVE   Ketones, ur NEGATIVE NEGATIVE mg/dL   Protein, ur NEGATIVE NEGATIVE mg/dL   Urobilinogen, UA 0.2 0.0 - 1.0 mg/dL   Nitrite NEGATIVE NEGATIVE   Leukocytes, UA NEGATIVE NEGATIVE  CBC with Differential  Result Value Ref Range   WBC 13.0 (H) 4.0 - 10.5 K/uL   RBC 3.69 (L) 4.22 - 5.81 MIL/uL   Hemoglobin 10.7 (L) 13.0 - 17.0 g/dL   HCT 33.3 (L) 39.0 - 52.0 %   MCV 90.2 78.0 - 100.0 fL   MCH 29.0 26.0 - 34.0 pg   MCHC 32.1 30.0 - 36.0 g/dL   RDW 15.4 11.5 - 15.5 %   Platelets 280 150 - 400 K/uL   Neutrophils Relative % 81 (H) 43 - 77 %   Neutro Abs 10.5 (H) 1.7 - 7.7 K/uL   Lymphocytes Relative 10 (L) 12 - 46 %   Lymphs Abs 1.3 0.7 - 4.0 K/uL   Monocytes Relative 8 3 - 12 %   Monocytes Absolute 1.0 0.1 - 1.0 K/uL   Eosinophils Relative 1 0 - 5 %   Eosinophils Absolute 0.2 0.0 - 0.7 K/uL   Basophils Relative 0 0 - 1 %   Basophils  Absolute 0.1 0.0 - 0.1 K/uL     Pattricia Boss, MD 04/05/15 1930

## 2015-04-05 NOTE — ED Notes (Signed)
Echo tech at bedside performing study

## 2015-04-05 NOTE — ED Notes (Signed)
Per ems, pt w/syncopal episode this am w/subsequent vomiting.  Pt arrives awake, alert, oriented, no complaints at this time.  Pt 1 week post op cabg x 2

## 2015-04-05 NOTE — ED Notes (Signed)
Heart healthy meal ordered for pt.

## 2015-04-05 NOTE — Progress Notes (Signed)
  Echocardiogram 2D Echocardiogram has been performed.  Diamond Nickel 04/05/2015, 3:30 PM

## 2015-04-05 NOTE — Discharge Instructions (Signed)
Follow up with your cardiologist as scheduled. Return if worse at any time. Drink plenty of fluids.

## 2015-04-05 NOTE — ED Provider Notes (Signed)
CSN: [id number]     Arrival date & time 04/05/15  1114 History   First MD Initiated Contact with Patient 04/05/15 1125     Chief Complaint  Patient presents with  . Loss of Consciousness     (Consider location/radiation/quality/duration/timing/severity/associated sxs/prior Treatment) Patient is a 74 y.o. male presenting with syncope. The history is provided by the patient.  Loss of Consciousness Associated symptoms: chest pain   Associated symptoms: no headaches, no nausea, no shortness of breath, no vomiting and no weakness    patient just got out of the hospital 2 days ago after a UTI and non-STEMI with CABG. He was going to the bathroom today and passed out. Reportedly did not have a palpable blood pressure for EMS.   he was confused. States he was going to bathroom but then does not remember passing out. Denies chest pain. Denies fever. States he has been walking and doing well since surgery. No blood in the stool. Past Medical History  Diagnosis Date  . Rhabdomyolysis   . Memory loss   . Exogenous obesity   . Insulin dependent diabetes mellitus   . Peripheral neuropathy   . Venous insufficiency   . CAD (coronary artery disease)   . Hyperlipidemia   . Gout   . Stroke     L patietal with small scattered lacunar infarcts  . Left foot drop   . Hypertension   . History of nuclear stress test 07/2011    dipyridamole; fixed inferolateral defect, worse at stress than rest; no reversible ischemia; low risk scan   . OSA on CPAP     uses a cpap  . Heart attack   . Aortic stenosis, mild 11/14/2013  . Bilateral leg edema 05/21/2014  . Diabetic peripheral neuropathy associated with type 1 diabetes mellitus 11/14/2013  . Obesity (BMI 30-39.9) 11/14/2013  . Thrombocytopenia 03/21/2015  . Obstructive sleep apnea 03/21/2015  . Dyspnea on exertion 03/21/2015  . Diabetes 1.5, managed as type 1 02/04/2013  . Weakness generalized 03/21/2015  . Chronic diastolic congestive heart failure   . Chronic  kidney disease (CKD), stage III (moderate)   . Dysphagia   . Peptic ulcer with hemorrhage 03/28/2015  . Left main coronary artery disease 03/28/2015  . S/P CABG x 2 03/29/2015    LIMA to Diagonal, SVG to OM, EVH via right thigh  . Aortic valve sclerosis 03/29/2015   Past Surgical History  Procedure Laterality Date  . Cholecystectomy    . Sinus endo w/fusion    . Coronary angioplasty  10/13/1996  . Rotator cuff repair Right   . Transthoracic echocardiogram  08/08/2013    EF 55-60%, mild conc hypertrophy, grade 1 diastolic dysfunction; AV with mild stenosis; LA & RA mildly dilated  . Coronary angioplasty  09/21/1989    emergency PTCA  . Coronary angioplasty  10/13/1996    Multi-Link diagonal & OD stenting (Dr. Marella Chimes)  . Coronary angioplasty  12/03/1997    disease of mid DX-1 ~50% & in mid PLA & PDA (distal lesions) (Dr. Marella Chimes)   . Coronary angioplasty  10/14/1999    progression of disease distal PLA & PDA; progression of disease prox RCA - moderate (Dr. Marella Chimes)   . Coronary angioplasty with stent placement  04/04/2004    4.0x60m non-DES (thrombectomy via AngioJet) to RCA for high grade stenosis (Dr. RMarella Chimes  . Carotid doppler  03/2013    bilat bulb/prox ICAs - mild amount of fibrous plaque with no evidence of  diameter reduction  . Back surgery  2002    lumbosacral  . Tonsillectomy    . Colonoscopy    . Carpal tunnel release Bilateral 08/09/2014    Procedure: BILATERAL CARPAL TUNNEL RELEASE;  Surgeon: Daryll Brod, MD;  Location: [place];  Service: Orthopedics;  Laterality: Bilateral;  ANESTHESIA:  IV REGIONAL BIL FAB  . Esophagogastroduodenoscopy N/A 03/27/2015    Procedure: ESOPHAGOGASTRODUODENOSCOPY (EGD);  Surgeon: Clarene Essex, MD;  Location[facility] END[facility];  Service: Endoscopy;  Laterality: N/A;  possible dilation  . Left heart catheterization with coronary angiogram N/A 03/28/2015    Procedure: LEFT HEART CATHETERIZATION WITH CORONARY ANGIOGRAM;   Surgeon: Peter M Martinique, MD;  Location: [facility] CATH LAB;  Service: Cardiovascular;  Laterality: N/A;  . Coronary artery bypass graft N/A 03/29/2015    Procedure: CORONARY ARTERY BYPASS GRAFTING TIMES TWO USING LEFT INTERNAL MAMMARY ARTERY AND RIGHT LEG GREATER SAPHENOUS VEIN HARVESTED ENDOSCOPICALLY.;  Surgeon: Rexene Alberts, MD;  Location: [facility];  Service: Open Heart Surgery;  Laterality: N/A;   Family History  Problem Relation Age of Onset  . Coronary artery disease Father   . Heart disease Mother   . Cancer Maternal Grandmother   . Heart Problems Maternal Grandfather    History  Substance Use Topics  . Smoking status: Former Smoker -- 2.00 packs/day for 20 years    Types: Cigarettes    Quit date: 01/25/1973  . Smokeless tobacco: Never Used  . Alcohol Use: No    Review of Systems  Constitutional: Negative for activity change and appetite change.  Eyes: Negative for pain.  Respiratory: Negative for chest tightness and shortness of breath.   Cardiovascular: Positive for chest pain and syncope. Negative for leg swelling.  Gastrointestinal: Negative for nausea, vomiting, abdominal pain and diarrhea.  Genitourinary: Negative for flank pain.  Musculoskeletal: Negative for back pain and neck stiffness.  Skin: Negative for rash.  Neurological: Positive for syncope. Negative for weakness, numbness and headaches.  Psychiatric/Behavioral: Negative for behavioral problems.      Allergies  Actos; Metformin and related; and Niaspan  Home Medications   Prior to Admission medications   Medication Sig Start Date End Date Taking? Authorizing Provider  allopurinol (ZYLOPRIM) 100 MG tablet Take 300 mg by mouth daily.     Historical Provider, MD  amitriptyline (ELAVIL) 50 MG tablet Take 50 mg by mouth at bedtime.    Historical Provider, MD  aspirin EC 325 MG EC tablet Take 1 tablet (325 mg total) by mouth daily. 04/03/15   Erin R Barrett, PA-C  bacitracin ointment Apply topically 2 (two) times  daily. 04/03/15   Erin R Barrett, PA-C  clopidogrel (PLAVIX) 75 MG tablet Take 75 mg by mouth daily.    Historical Provider, MD  colchicine 0.6 MG tablet Take 0.6 mg by mouth daily as needed.    Historical Provider, MD  donepezil (ARICEPT) 10 MG tablet Take 1 tablet (10 mg total) by mouth daily. 09/10/14   Marcial Pacas, MD  fish oil-omega-3 fatty acids 1000 MG capsule Take 1 g by mouth 2 (two) times daily.     Historical Provider, MD  HYDROcodone-acetaminophen (VICODIN) 5-500 MG per tablet Take 2 tablets by mouth at bedtime.     Historical Provider, MD  Insulin Glargine 300 UNIT/ML SOPN Inject 60 Units into the skin every morning.    Historical Provider, MD  insulin lispro (HUMALOG) 100 UNIT/ML injection Inject 20-25 Units into the skin 4 (four) times daily. Takes 25u in am, 20u  at lunch, 20u in evening and 25u at bedtime    Historical Provider, MD  lisinopril (PRINIVIL,ZESTRIL) 5 MG tablet Take 10 mg by mouth daily.     Historical Provider, MD  metoprolol succinate (TOPROL-XL) 50 MG 24 hr tablet Take 1 tablet (50 mg total) by mouth daily. Take with or immediately following a meal. 04/03/15   Erin R Barrett, PA-C  NON FORMULARY Inhale 1 application into the lungs at bedtime. CPAP    Historical Provider, MD  pantoprazole (PROTONIX) 40 MG tablet Take 1 tablet (40 mg total) by mouth 2 (two) times daily. 04/03/15   Erin R Barrett, PA-C  potassium chloride SA (K-DUR,KLOR-CON) 20 MEQ tablet Take 20 mEq by mouth daily.     Historical Provider, MD  rosuvastatin (CRESTOR) 20 MG tablet Take 20 mg by mouth daily.    Historical Provider, MD  Tamsulosin HCl (FLOMAX) 0.4 MG CAPS Take 0.4 mg by mouth daily.     Historical Provider, MD  torsemide (DEMADEX) 10 MG tablet Take 50 mg by mouth daily.    Historical Provider, MD  traMADol (ULTRAM) 50 MG tablet Take 1-2 tablets (50-100 mg total) by mouth every 4 (four) hours as needed for moderate pain. 04/03/15   Erin R Barrett, PA-C   BP 138/99 mmHg  Pulse 66  Temp(Src) 98.2  F (36.8 C) (Oral)  Resp 20  Ht '5\' 9"'$  (1.753 m)  Wt 260 lb (117.935 kg)  BMI 38.38 kg/m2  SpO2 100% Physical Exam  Constitutional: He appears well-developed and well-nourished.  HENT:  Head: Atraumatic.  Eyes: Pupils are equal, round, and reactive to light.  Neck: Neck supple.  Cardiovascular: Normal rate.   Pulmonary/Chest:  Well-healing median sternotomy  Abdominal: There is no tenderness.  Musculoskeletal: He exhibits edema.  Some bilateral edema, worse on the left.  Neurological: He is alert.  Skin:  Multiple areas of ecchymosis, right upper arm and large amount to right flank. Chronic per patient.    ED Course  Procedures (including critical care time) Labs Review Labs Reviewed  BASIC METABOLIC PANEL - Abnormal; Notable for the following:    Glucose, Bld 145 (*)    BUN 28 (*)    Creatinine, Ser 2.01 (*)    GFR calc non Af Amer 31 (*)    GFR calc Af Amer 36 (*)    All other components within normal limits  TROPONIN I - Abnormal; Notable for the following:    Troponin I 0.08 (*)    All other components within normal limits  CBC WITH DIFFERENTIAL/PLATELET - Abnormal; Notable for the following:    WBC 13.0 (*)    RBC 3.69 (*)    Hemoglobin 10.7 (*)    HCT 33.3 (*)    Neutrophils Relative % 81 (*)    Neutro Abs 10.5 (*)    Lymphocytes Relative 10 (*)    All other components within normal limits  CBC WITH DIFFERENTIAL/PLATELET  URINALYSIS, ROUTINE W REFLEX MICROSCOPIC    Imaging Review Dg Chest 2 View  04/05/2015   CLINICAL DATA:  74 year old male with a history of loss of consciousness. Recent median sternotomy/ cardiac surgery  EXAM: CHEST - 2 VIEW  COMPARISON:  04/02/2015, 04/01/2015, 03/31/2015  FINDINGS: Cardiomediastinal silhouette similar to most recent plain film comparison. Mild widening of the vascular pedicle, similar to prior.  No pneumothorax.  Improved opacity at the left base though there is still partial obscuration left hemidiaphragm and blunting  of the left costophrenic angle. Blunting of the  costophrenic sulcus on the lateral view.  Interstitial opacities. Right lung relatively well aerated. Ill-defined opacities at the base the lung on the lateral view.  Surgical changes of median sternotomy.  Atherosclerosis of the aortic arch.  Anterior cervical discectomy and fusion.  Unremarkable appearance of the upper abdomen.  IMPRESSION: Persisting interstitial/ airspace opacity at the left base, may represent atelectasis/scarring in this patient status post recent cardiac surgery, however, a developing infection cannot be excluded.  Likely small left pleural effusion.  Surgical changes of median sternotomy.  Atherosclerosis.  Signed,  Dulcy Fanny. Earleen Newport, DO  [facility]  [facility]   Electronically Signed   By: Corrie Mckusick D.O.   On: 04/05/2015 13:17     EKG Interpretation   Date/Time:  Friday April 05 2015 11:17:24 EDT Ventricular Rate:  73 PR Interval:  230 QRS Duration: 87 QT Interval:  481 QTC Calculation: 530 R Axis:   26 Text Interpretation:  Sinus rhythm Prolonged PR interval Minimal ST  depression, anterolateral leads ST elevation, consider inferior injury  Prolonged QT interval Confirmed by Alvino Chapel  MD, Ovid Curd [phone]) on  04/05/2015 12:23:00 PM      MDM   Final diagnoses:  Vasovagal syncope  Renal insufficiency    Patient with syncope. May be vasovagal. Began while he was going to the bathroom.he feels much better now. Blood pressure is improved. Creatinine has somewhat increased from recent. X-ray shows interstitial opacity. May be scarring versus atelectasis. Patient is not short of breath and does not have a cough. No fevers. Discussed with cardiology and will get 2-D echo to evaluate for pericardial effusion due to recent bypass. Urine also pending. Likely discharge home.    Davonna Belling, MD 04/05/15 [phone]

## 2015-04-09 ENCOUNTER — Telehealth: Payer: Self-pay | Admitting: *Deleted

## 2015-04-09 NOTE — Telephone Encounter (Signed)
Faxed order for phase 2 cardiac rehab - no gxt - to [facility]

## 2015-04-18 ENCOUNTER — Telehealth: Payer: Self-pay | Admitting: *Deleted

## 2015-04-18 NOTE — Telephone Encounter (Signed)
Faxed orders for phase 2 cardiac rehab - no GXT required.  

## 2015-04-23 ENCOUNTER — Other Ambulatory Visit: Payer: Self-pay | Admitting: Thoracic Surgery (Cardiothoracic Vascular Surgery)

## 2015-04-23 DIAGNOSIS — Z951 Presence of aortocoronary bypass graft: Secondary | ICD-10-CM

## 2015-04-24 ENCOUNTER — Encounter: Payer: Self-pay | Admitting: Internal Medicine

## 2015-04-24 NOTE — Progress Notes (Signed)
04/25/2015 Randall Wilkins   [birth year]  [id number]  Primary Physician Simona Huh, MD Primary Cardiologist: Dr. Debara Pickett  Reason for visit/CC: Post hospital follow-up for CAD/status post CABG  HPI:  Patient is a 74 year old male with history of CAD, hypertension, hyperlipidemia, diabetes, stage III chronic kidney disease and mild aortic stenosis who presents to clinic today for post hospital follow-up. He recently was admitted to [facility] for chest pain and ruled in for non-STEMI. Subsequent left heart catheterization revealed critical left main disease. 95% ostial LAD stenosis. Also with 99% ostial ramus intermediate branch stenosis and 100% ostial occlusion of the left circumflex with faint left-to-right collaterals. The RCA had 60% mid stenosis. He required emergent coronary artery bypass grafting 2 by Dr. Roxy Manns on 03/29/2015. Status post LIMA to LAD and SVG to obtuse marginal branches of the left circumflex coronary artery. Left ventricular function slightly depressed with EF at 40-45%.  He reports that he has done well. No recurrent CP or dyspnea. No orthopnea, PND, LEE, syncope/ near syncope. He reports full medication compliance. He is scheduled to see Dr. Roxy Manns on 5/16.   Current Outpatient Prescriptions  Medication Sig Dispense Refill  . allopurinol (ZYLOPRIM) 100 MG tablet Take 300 mg by mouth daily.     Marland Kitchen amitriptyline (ELAVIL) 50 MG tablet Take 50 mg by mouth at bedtime.    Marland Kitchen aspirin EC 325 MG EC tablet Take 1 tablet (325 mg total) by mouth daily. 30 tablet 0  . bacitracin ointment Apply topically 2 (two) times daily. 120 g 0  . clopidogrel (PLAVIX) 75 MG tablet Take 75 mg by mouth daily.    . colchicine 0.6 MG tablet Take 0.6 mg by mouth daily as needed.    . donepezil (ARICEPT) 10 MG tablet Take 1 tablet (10 mg total) by mouth daily. 90 tablet 3  . fish oil-omega-3 fatty acids 1000 MG capsule Take 1 g by mouth 2 (two) times daily.     Marland Kitchen HYDROcodone-acetaminophen  (VICODIN) 5-500 MG per tablet Take 2 tablets by mouth at bedtime.     . Insulin Glargine 300 UNIT/ML SOPN Inject 60 Units into the skin every morning.    . insulin lispro (HUMALOG) 100 UNIT/ML injection Inject 20-25 Units into the skin 4 (four) times daily. Takes 25u in am, 20u at lunch, 20u in evening and 25u at bedtime    . lisinopril (PRINIVIL,ZESTRIL) 5 MG tablet Take 10 mg by mouth daily.     . metoprolol succinate (TOPROL-XL) 50 MG 24 hr tablet Take 1 tablet (50 mg total) by mouth daily. Take with or immediately following a meal. 30 tablet 3  . NON FORMULARY Inhale 1 application into the lungs at bedtime. CPAP    . pantoprazole (PROTONIX) 40 MG tablet Take 1 tablet (40 mg total) by mouth 2 (two) times daily. 60 tablet 3  . potassium chloride SA (K-DUR,KLOR-CON) 20 MEQ tablet Take 20 mEq by mouth daily.     . rosuvastatin (CRESTOR) 20 MG tablet Take 20 mg by mouth daily.    . Tamsulosin HCl (FLOMAX) 0.4 MG CAPS Take 0.4 mg by mouth daily.     Marland Kitchen torsemide (DEMADEX) 10 MG tablet Take 50 mg by mouth daily.    . traMADol (ULTRAM) 50 MG tablet Take 1-2 tablets (50-100 mg total) by mouth every 4 (four) hours as needed for moderate pain. 30 tablet 0   No current facility-administered medications for this visit.    Allergies  Allergen Reactions  .  Actos [Pioglitazone] Swelling  . Metformin And Related Nausea Only  . Niaspan [Niacin Er] Itching and Rash    History   Social History  . Marital Status: Married    Spouse Name: N/A  . Number of Children: 3  . Years of Education: 12   Occupational History  . Not on file.   Social History Main Topics  . Smoking status: Former Smoker -- 2.00 packs/day for 20 years    Types: Cigarettes    Quit date: 01/25/1973  . Smokeless tobacco: Never Used  . Alcohol Use: No  . Drug Use: No  . Sexual Activity: Not on file   Other Topics Concern  . Not on file   Social History Narrative     Review of Systems: General: negative for chills,  fever, night sweats or weight changes.  Cardiovascular: negative for chest pain, dyspnea on exertion, edema, orthopnea, palpitations, paroxysmal nocturnal dyspnea or shortness of breath Dermatological: negative for rash Respiratory: negative for cough or wheezing Urologic: negative for hematuria Abdominal: negative for nausea, vomiting, diarrhea, bright red blood per rectum, melena, or hematemesis Neurologic: negative for visual changes, syncope, or dizziness All other systems reviewed and are otherwise negative except as noted above.    Blood pressure 132/54, pulse 66, height '5\' 9"'$  (1.753 m), weight 255 lb 9.6 oz (115.939 kg).  General appearance: alert, cooperative and no distress Neck: no carotid bruit and no JVD Lungs: clear to auscultation bilaterally Heart: no LEE Extremities: no LEE Pulses: 2+ and symmetric Skin: warm and dry Neurologic: Grossly normal  EKG NSR 66 bpm  ASSESSMENT AND PLAN:   1. CAD: Status post recent CABG 2 with LIMA to LAD and saphenous vein graft to OM branch of left circumflex. Stable w/o recurrent CP. Continue ASA, Plavix, Crestor and metoprolol.   2. HTN: well controlled on current regimen.  3. HLD: LP followed by PCP. On Crestor.   4. DM: followed by PCP.  5. LV Dysfunction: Mild. EF 40-45% on recent echo. Stable w/o dyspnea or edema. Euvolemic on exam. Continue BB, ACE-I and diuretic.    PLAN  Patient doing well. No change in therapy. Keep f/u with Dr. Debara Pickett on 05/14/14  SIMMONS, [facility] 04/25/2015 9:44 AM

## 2015-04-25 ENCOUNTER — Ambulatory Visit (INDEPENDENT_AMBULATORY_CARE_PROVIDER_SITE_OTHER): Payer: Medicare Other | Admitting: Cardiology

## 2015-04-25 ENCOUNTER — Encounter: Payer: Self-pay | Admitting: Cardiology

## 2015-04-25 VITALS — BP 132/54 | HR 66 | Ht 69.0 in | Wt 255.6 lb

## 2015-04-25 DIAGNOSIS — E785 Hyperlipidemia, unspecified: Secondary | ICD-10-CM | POA: Diagnosis not present

## 2015-04-25 NOTE — Patient Instructions (Signed)
Your physician recommends that you schedule a follow-up appointment in: AS SCHEDULED

## 2015-04-29 ENCOUNTER — Encounter: Payer: Self-pay | Admitting: Thoracic Surgery (Cardiothoracic Vascular Surgery)

## 2015-04-29 ENCOUNTER — Ambulatory Visit (INDEPENDENT_AMBULATORY_CARE_PROVIDER_SITE_OTHER): Payer: Self-pay | Admitting: Thoracic Surgery (Cardiothoracic Vascular Surgery)

## 2015-04-29 ENCOUNTER — Ambulatory Visit
Admission: RE | Admit: 2015-04-29 | Discharge: 2015-04-29 | Disposition: A | Discharge status: Home / Self Care | Payer: Medicare Other | Source: Ambulatory Visit | Attending: Thoracic Surgery (Cardiothoracic Vascular Surgery) | Admitting: Thoracic Surgery (Cardiothoracic Vascular Surgery)

## 2015-04-29 VITALS — BP 125/68 | HR 71 | Resp 16 | Ht 69.0 in | Wt 252.0 lb

## 2015-04-29 DIAGNOSIS — Z951 Presence of aortocoronary bypass graft: Secondary | ICD-10-CM

## 2015-04-29 DIAGNOSIS — I2510 Atherosclerotic heart disease of native coronary artery without angina pectoris: Secondary | ICD-10-CM

## 2015-04-29 MED ORDER — ASPIRIN EC 81 MG PO TBEC: 81.0000 mg | DELAYED_RELEASE_TABLET | Freq: Every day | ORAL | Status: DC

## 2015-04-29 NOTE — Progress Notes (Signed)
[address]       [place] [address]             [phone]     CARDIOTHORACIC SURGERY OFFICE NOTE  Referring Provider is Pixie Casino, MD PCP is Simona Huh, MD   HPI:  Patient returns for routine follow-up status post coronary artery bypass grafting 2 on 03/29/2015 for critical left main coronary artery disease status post acute non-ST segment elevation myocardial infarction.  He originally presented with urosepsis related to E coli urinary tract infection, and while in the hospital the patient suffered an acute non-ST segment elevation myocardial infarction.  His postoperative course was uncomplicated and he was discharged from the hospital on the fifth postoperative day. He completed his course of antibiotics for his urinary tract infection prior to hospital discharge.  Since discharge he has been seen in follow-up at Dr. Lysbeth Penner office last week, and he returns to our office for routine follow-up today. The patient states that he feels quite well. He has no significant pain in his chest and he has not taken any sort of pain relievers since hospital discharge.   His appetite is good and he is sleeping well at night. He denies any shortness of breath. His mobility is limited by severe chronic pain in both knees and problems with his left lower leg. He thinks that he will not be able to participate in outpatient cardiac rehabilitation program. He states his blood sugars have been stable. Overall he feels quite well and he has no complaints.  Current Outpatient Prescriptions  Medication Sig Dispense Refill  . allopurinol (ZYLOPRIM) 100 MG tablet Take 300 mg by mouth daily.     Marland Kitchen amitriptyline (ELAVIL) 50 MG tablet Take 50 mg by mouth at bedtime.    Marland Kitchen aspirin 81 MG tablet Take 1 tablet (81 mg total) by mouth daily.    . bacitracin ointment Apply topically 2 (two) times daily. 120 g 0  . clopidogrel (PLAVIX) 75 MG tablet Take 75 mg by mouth daily.    .  colchicine 0.6 MG tablet Take 0.6 mg by mouth daily as needed.    . donepezil (ARICEPT) 10 MG tablet Take 1 tablet (10 mg total) by mouth daily. 90 tablet 3  . fish oil-omega-3 fatty acids 1000 MG capsule Take 1 g by mouth 2 (two) times daily.     Marland Kitchen HYDROcodone-acetaminophen (VICODIN) 5-500 MG per tablet Take 2 tablets by mouth at bedtime.     . Insulin Glargine 300 UNIT/ML SOPN Inject 60 Units into the skin every morning.    . insulin lispro (HUMALOG) 100 UNIT/ML injection Inject 20-25 Units into the skin 4 (four) times daily. Takes 25u in am, 20u at lunch, 20u in evening and 25u at bedtime    . lisinopril (PRINIVIL,ZESTRIL) 5 MG tablet Take 10 mg by mouth daily.     . metoprolol succinate (TOPROL-XL) 50 MG 24 hr tablet Take 1 tablet (50 mg total) by mouth daily. Take with or immediately following a meal. 30 tablet 3  . NON FORMULARY Inhale 1 application into the lungs at bedtime. CPAP    . pantoprazole (PROTONIX) 40 MG tablet Take 1 tablet (40 mg total) by mouth 2 (two) times daily. 60 tablet 3  . potassium chloride SA (K-DUR,KLOR-CON) 20 MEQ tablet Take 20 mEq by mouth daily.     . rosuvastatin (CRESTOR) 20 MG tablet Take 20 mg by mouth daily.    Marland Kitchen torsemide (DEMADEX)  10 MG tablet Take 50 mg by mouth daily.    . traMADol (ULTRAM) 50 MG tablet Take 1-2 tablets (50-100 mg total) by mouth every 4 (four) hours as needed for moderate pain. 30 tablet 0  . Tamsulosin HCl (FLOMAX) 0.4 MG CAPS Take 0.4 mg by mouth daily.      No current facility-administered medications for this visit.      Physical Exam:   BP 125/68 mmHg  Pulse 71  Resp 16  Ht '5\' 9"'$  (1.753 m)  Wt 252 lb (114.306 kg)  BMI 37.20 kg/m2  SpO2 98%  General:  Well-appearing  Chest:   clear  CV:   Regular rate and rhythm with grade II/VI systolic murmur heard along right upper sternal border  Incisions:  Clean and dry and healing nicely, sternum is stable  Abdomen:  Soft and nontender  Extremities:  Warm and  well-perfused  Diagnostic Tests:  CHEST 2 VIEW  COMPARISON: 04/05/2015  FINDINGS: Improvement in small left pleural effusion. Improvement in left lower lobe aeration with minimal residual atelectasis.  Negative for heart failure or pneumonia. Right lung is clear.  IMPRESSION: Significant improvement in left lower lobe atelectasis and effusion. Otherwise negative.   Electronically Signed  By: Franchot Gallo M.D.  On: 04/29/2015 11:36   Impression:  Patient is doing very well approximately 4 weeks status post coronary artery bypass grafting 2. At the time of surgery he was noted to have aortic valve sclerosis without significant aortic stenosis. This will need to be followed indefinitely in the future. The patient currently appears to be doing well and I feel he would benefit from participation in outpatient cardiac rehabilitation program.  Plan:  I have encouraged the patient to continue to gradually increase his physical activity as tolerated with his primary limitation remaining that he refrain from heavy lifting or strenuous use of his arms or shoulders for least another 2 months. I've encouraged the patient to participate in outpatient cardiac rehabilitation program and I have reassured him that they will be able to take and will count is limitations regarding his problems with arthritis and decreased mobility. We have not recommended any changes to the patient's current medications, although we did clarify the fact the patient should be taking aspirin 81 mg daily in addition to Plavix 75 mg daily.    Valentina Gu. Roxy Manns, MD 04/29/2015 12:14 PM

## 2015-04-29 NOTE — Patient Instructions (Signed)
The patient should continue to avoid any heavy lifting or strenuous use of arms or shoulders for at least a total of three months from the time of surgery.  The patient is encouraged to enroll and participate in the outpatient cardiac rehab program beginning as soon as practical.  The patient is reminded to make every effort to keep their diabetes under very tight control.  They should follow up closely with their primary care physician or endocrinologist and strive to keep their hemoglobin A1c levels as low as possible, preferably near or below 6.0

## 2015-05-02 ENCOUNTER — Telehealth: Payer: Self-pay | Admitting: Internal Medicine

## 2015-05-02 NOTE — Telephone Encounter (Signed)
Received records from [place] for appointment on 05/15/15 with Dr Debara Pickett.  Records given to [facility] (medical records) for Dr Lysbeth Penner schedule on 05/15/15. lp

## 2015-05-09 ENCOUNTER — Encounter (HOSPITAL_COMMUNITY)
Admission: RE | Admit: 2015-05-09 | Discharge: 2015-05-09 | Disposition: A | Discharge status: Home / Self Care | Payer: Medicare Other | Source: Ambulatory Visit | Attending: Internal Medicine | Admitting: Internal Medicine

## 2015-05-09 NOTE — Progress Notes (Signed)
Cardiac Rehab Medication Review by a Pharmacist  Does the patient  feel that his/her medications are working for him/her?  yes  Has the patient been experiencing any side effects to the medications prescribed?  no  Does the patient measure his/her own blood pressure or blood glucose at home?  Yes. BP running 120-130/40-50 - some dizziness. Has appt with Dr. Debara Pickett next week - will let him know. CBGs running 150s-180s fasting - f/u in August.   Does the patient have any problems obtaining medications due to transportation or finances?   no  Understanding of regimen: excellent Understanding of indications: good Potential of compliance: excellent    Pharmacist comments: Pt presents to cardiac rehab today with his wife for review of his medications. He reports no issues with adherence or side effects, although he does state that his fasting CBGs have been elevated in the 150s-180s since he left the hospital last month. He has a DM f/u coming up in August but states that he has been able to titrate up his Toujeo on his own when he sees high readings. He also reports orthostatic hypotension. He checks his BP at home regularly and systolic has been normal in the [id number] but his diastolic has been low, no more than 50. He has a cardiology f/u appt with Dr. Debara Pickett next week and plans to bring up the issue at that time. Encouraged patient to stay well hydrated and to take his time when going from sitting to standing.  Randall Wilkins, Pharm.D Clinical Pharmacy Resident Pager: [phone] 05/09/2015 8:21 AM     Wilkins, Randall Wilkins 05/09/2015 8:12 AM

## 2015-05-15 ENCOUNTER — Ambulatory Visit (INDEPENDENT_AMBULATORY_CARE_PROVIDER_SITE_OTHER): Payer: Medicare Other | Admitting: Internal Medicine

## 2015-05-15 ENCOUNTER — Encounter (HOSPITAL_COMMUNITY)
Admission: RE | Admit: 2015-05-15 | Discharge: 2015-05-15 | Disposition: A | Discharge status: Home / Self Care | Payer: Medicare Other | Source: Ambulatory Visit | Attending: Internal Medicine | Admitting: Internal Medicine

## 2015-05-15 ENCOUNTER — Encounter: Payer: Self-pay | Admitting: Internal Medicine

## 2015-05-15 VITALS — BP 136/56 | HR 56 | Ht 69.0 in | Wt 258.2 lb

## 2015-05-15 DIAGNOSIS — Z951 Presence of aortocoronary bypass graft: Secondary | ICD-10-CM

## 2015-05-15 DIAGNOSIS — I5032 Chronic diastolic (congestive) heart failure: Secondary | ICD-10-CM | POA: Diagnosis not present

## 2015-05-15 DIAGNOSIS — N183 Chronic kidney disease, stage 3 unspecified: Secondary | ICD-10-CM

## 2015-05-15 DIAGNOSIS — I214 Non-ST elevation (NSTEMI) myocardial infarction: Secondary | ICD-10-CM | POA: Insufficient documentation

## 2015-05-15 LAB — GLUCOSE, CAPILLARY
Glucose-Capillary: 141 mg/dL — ABNORMAL HIGH (ref 65–99)
Glucose-Capillary: 122 mg/dL — ABNORMAL HIGH (ref 65–99)

## 2015-05-15 NOTE — Progress Notes (Signed)
Pt started cardiac rehab today.  Pt tolerated light exercise with some difficulty due to his knee and left leg which he wears a brace. Pt unable to walk with the assistance walker and for safety reasons pt placed on equipment that involved seating.  Pt with complaint of dizziness and was advised to cut his toprol xl to 37.5.  Pt had already taken his usual dose(32m)this morning.  Orthostatic bp checked Pt sitting bp 110/50 standing 100/50, pt denies any dizziness and stated it usual starts after I walk a bit.  VSS during exercise, telemetry showed SR with first degree block which is present on his most recent 12 lead ekg on 5/12.  Medication list reconciled.  Pt verbalized compliance with medications and denies barriers to compliance. PSYCHOSOCIAL ASSESSMENT:  PHQ-0. Pt has a supportive wife who is his primary caregiver. Pt wife was present for orientation class and for todays exercise session.  Pt wife is a former participant in the cardiac rehab phase II program in 2015. Pt denies any depression and Pt exhibits positive coping skills, hopeful outlook. No psychosocial needs identified at this time, no psychosocial interventions necessary.    Pt enjoys spending time with family.   Pt cardiac rehab  goal is  to increase his mobility. Pt would like to be able to walk more without difficulty.  Pt needs a knee replacement but has not made the decision in his head to move forward. Pt remarks that he hasn't hurt enough yet for consideration of the knee surgery. Pt is hopeful that the exercise program will help him achieve this goal.  Pt encouraged to participate in home exercise as able  to increase ability to achieve these goals.   Pt long term cardiac rehab goal is increase strength and stability. Will monitor his progress toward meeting this goal with MET level progression and pt perception of increased strength.  Pt oriented to exercise equipment and routine.  Understanding verbalized. CCherre Huger BSN

## 2015-05-15 NOTE — Patient Instructions (Signed)
Your physician has recommended you make the following change in your medication: DECREASE metoprolol to 37.5 mg daily   Your physician recommends that you schedule a follow-up appointment in 3 months.

## 2015-05-15 NOTE — Progress Notes (Signed)
OFFICE NOTE  Chief Complaint:  Hospital follow-up  Primary Care Physician: Randall Huh, Randall Wilkins  HPI:  Randall Wilkins is a pleasant 74 year old male patient who was formerly followed by Randall Wilkins. He is here to establish cardiac care with [place] today. He has a history of obesity, multiple comorbidities including insulin-dependent diabetes, diabetic peripheral neuropathy, diabetic nephropathy, venous insufficiency and peripheral neuropathy. He also has obstructive sleep apnea on CPAP and coronary artery disease. He unfortunately suffered a left parietal stroke with small lacunar infarcts noted on MRI in 2013. He is a left foot drop from an L4/L5 neuropathy and wears a brace for this. From a cardiac standpoint he had a large non-drug-eluting stent placed in the RCA in 2005. He has had a stent placed at 97 to a diagonal which was noted to be patent. He's had some problems with dizziness which is improved. He [place] as large to medium secondary to neuropathy and venous insufficiency. Unfortunately not been able to lose a lot of weight due to his difficulty in ambulating and painful neuropathy.  He denies any cardiac chest pain or worsening shortness of breath.  [name] for a six-month followup. He is having no complaints. He does report some lower extremity swelling which is stable. He has mild aortic stenosis which is been stable as well. His last echo was in August 2014. He denies any chest pain or worsening shortness of breath. He recently had lab work to Dr. Eugenio Wilkins office which we will obtain.  I saw [name] today for hospital follow-up. He is reportedly doing much better. He has more energy and no significant chest discomfort. Between our last appointment see presented with a urinary tract infection and ultimately had chest pain and profuse diaphoresis. He was found to have elevated troponin and underwent cardiac catheterization. This demonstrated the following:  Coronary  angiography: Coronary dominance: right  Left mainstem: 95% distal left main stenosis.  Left anterior descending (LAD): There is 95% ostial LAD stenosis. The LAD is a large vessel. The LAD and diagonal otherwise are without significant disease.  There is a large ramus intermediate branch with 99% ostial stenosis.  Left circumflex (LCx): 100% ostial occlusion with faint left to left collaterals.  Right coronary artery (RCA): the RCA is a large dominant branch. The stent in the proximal vessel is widely patent. There is a 60% stenosis in the mid PDA. The third PLOM is moderate in size with a long 90-95% stenosis prior to distal arborization.   Subsequently he underwent emergent coronary artery bypass grafting with a LIMA to LAD and SVG to OM branch. He is noted to have mild aortic sclerosis versus stenosis, however the valve was not severe enough to be addressed. Since discharge he is more active and is starting cardiac rehabilitation today.  PMHx:  Past Medical History  Diagnosis Date  . Rhabdomyolysis   . Memory loss   . Exogenous obesity   . Insulin dependent diabetes mellitus   . Peripheral neuropathy   . Venous insufficiency   . CAD (coronary artery disease)   . Hyperlipidemia   . Gout   . Stroke     L patietal with small scattered lacunar infarcts  . Left foot drop   . Hypertension   . History of nuclear stress test 07/2011    dipyridamole; fixed inferolateral defect, worse at stress than rest; no reversible ischemia; low risk scan   . OSA on CPAP     uses  a cpap  . Heart attack   . Aortic stenosis, mild 11/14/2013  . Bilateral leg edema 05/21/2014  . Diabetic peripheral neuropathy associated with type 1 diabetes mellitus 11/14/2013  . Obesity (BMI 30-39.9) 11/14/2013  . Thrombocytopenia 03/21/2015  . Obstructive sleep apnea 03/21/2015  . Dyspnea on exertion 03/21/2015  . Diabetes 1.5, managed as type 1 02/04/2013  . Weakness generalized 03/21/2015  . Chronic diastolic congestive  heart failure   . Chronic kidney disease (CKD), stage III (moderate)   . Dysphagia   . Peptic ulcer with hemorrhage 03/28/2015  . Left main coronary artery disease 03/28/2015  . S/P CABG x 2 03/29/2015    LIMA to Diagonal, SVG to OM, EVH via right thigh  . Aortic valve sclerosis 03/29/2015    Past Surgical History  Procedure Laterality Date  . Cholecystectomy    . Sinus endo w/fusion    . Coronary angioplasty  10/13/1996  . Rotator cuff repair Right   . Transthoracic echocardiogram  08/08/2013    EF 55-60%, mild conc hypertrophy, grade 1 diastolic dysfunction; AV with mild stenosis; LA & RA mildly dilated  . Coronary angioplasty  09/21/1989    emergency PTCA  . Coronary angioplasty  10/13/1996    Multi-Link diagonal & OD stenting (Dr. Marella Wilkins)  . Coronary angioplasty  12/03/1997    disease of mid DX-1 ~50% & in mid PLA & PDA (distal lesions) (Dr. Marella Wilkins)   . Coronary angioplasty  10/14/1999    progression of disease distal PLA & PDA; progression of disease prox RCA - moderate (Dr. Marella Wilkins)   . Coronary angioplasty with stent placement  04/04/2004    4.0x73m non-DES (thrombectomy via Randall Wilkins) to RCA for high grade stenosis (Dr. RMarella Wilkins  . Carotid doppler  03/2013    bilat bulb/prox ICAs - mild amount of fibrous plaque with no evidence of diameter reduction  . Back surgery  2002    lumbosacral  . Tonsillectomy    . Colonoscopy    . Carpal tunnel release Bilateral 08/09/2014    Procedure: BILATERAL CARPAL TUNNEL RELEASE;  Surgeon: Randall Brod Randall Wilkins;  [facility]  Service: Orthopedics;  Laterality: Bilateral;  ANESTHESIA:  IV REGIONAL BIL FAB  . Esophagogastroduodenoscopy N/A 03/27/2015    Procedure: ESOPHAGOGASTRODUODENOSCOPY (EGD);  Surgeon: MClarene Essex Randall Wilkins;  [facility];  Service: Endoscopy;  Laterality: N/A;  possible dilation  . Left heart catheterization with coronary angiogram N/A 03/28/2015    Procedure: LEFT HEART  CATHETERIZATION WITH CORONARY ANGIOGRAM;  Surgeon: Randall M JMartinique Randall Wilkins;  [facility];  Service: Cardiovascular;  Laterality: N/A;  . Coronary artery bypass graft N/A 03/29/2015    Procedure: CORONARY ARTERY BYPASS GRAFTING TIMES TWO USING LEFT INTERNAL MAMMARY ARTERY AND RIGHT LEG GREATER SAPHENOUS VEIN HARVESTED ENDOSCOPICALLY.;  Surgeon: CRexene Alberts Randall Wilkins;  [place] [facility]  Service: Open Heart Surgery;  Laterality: N/A;    FAMHx:  Family History  Problem Relation Age of Onset  . Coronary artery disease Father   . Heart disease Mother   . Cancer Maternal Grandmother   . Heart Problems Maternal Grandfather     SOCHx:   reports that he quit smoking about 42 years ago. His smoking use included Cigarettes. He has a 40 pack-year smoking history. He has never used smokeless tobacco. He reports that he does not drink alcohol or use illicit drugs.  ALLERGIES:  Allergies  Allergen Reactions  . Actos [Pioglitazone] Swelling  . Metformin And Related  Nausea Only  . Niaspan [Niacin Er] Itching and Rash    ROS: A comprehensive review of systems was negative except for: Cardiovascular: positive for lower extremity edema Neurological: positive for gait problems and neuropathy Endocrine: positive for diabetic neuropathy  HOME MEDS: Current Outpatient Prescriptions  Medication Sig Dispense Refill  . allopurinol (ZYLOPRIM) 100 MG tablet Take 300 mg by mouth daily.     Marland Kitchen amitriptyline (ELAVIL) 50 MG tablet Take 50 mg by mouth 2 (two) times daily.     Marland Kitchen aspirin 81 MG tablet Take 1 tablet (81 mg total) by mouth daily.    . clopidogrel (PLAVIX) 75 MG tablet Take 75 mg by mouth daily.    . colchicine 0.6 MG tablet Take 0.6 mg by mouth daily as needed.    . donepezil (ARICEPT) 10 MG tablet Take 1 tablet (10 mg total) by mouth daily. 90 tablet 3  . fish oil-omega-3 fatty acids 1000 MG capsule Take 1 g by mouth 2 (two) times daily.     Marland Kitchen HYDROcodone-acetaminophen (VICODIN) 5-500 MG per  tablet Take 2 tablets by mouth at bedtime.     . Insulin Glargine 300 UNIT/ML SOPN Inject 60 Units into the skin every morning.    . insulin lispro (HUMALOG) 100 UNIT/ML injection Inject 20-25 Units into the skin 4 (four) times daily. Takes 25u in am, 20u at lunch, 20u in evening and 25u at bedtime    . lisinopril (PRINIVIL,ZESTRIL) 5 MG tablet Take 5 mg by mouth daily.     . metoprolol succinate (TOPROL-XL) 25 MG 24 hr tablet Take 37.5 mg by mouth daily.    . NON FORMULARY Inhale 1 application into the lungs at bedtime. CPAP    . pantoprazole (PROTONIX) 40 MG tablet Take 1 tablet (40 mg total) by mouth 2 (two) times daily. (Patient taking differently: Take 40 mg by mouth daily. ) 60 tablet 3  . potassium chloride SA (K-DUR,KLOR-CON) 20 MEQ tablet Take 20 mEq by mouth daily.     . rosuvastatin (CRESTOR) 20 MG tablet Take 20 mg by mouth daily.    . Tamsulosin HCl (FLOMAX) 0.4 MG CAPS Take 0.4 mg by mouth daily.     Marland Kitchen torsemide (DEMADEX) 10 MG tablet Take 50 mg by mouth daily.     No current facility-administered medications for this visit.    LABS/IMAGING: No results found for this or any previous visit (from the past 48 hour(s)). No results found.  VITALS: BP 136/56 mmHg  Pulse 56  Ht '5\' 9"'$  (1.753 m)  Wt 258 lb 3.2 oz (117.119 kg)  BMI 38.11 kg/m2  EXAM: General appearance: alert and no distress Neck: no carotid bruit and no JVD Lungs: clear to auscultation bilaterally Heart: regular rate and rhythm, S1, S2 normal, systolic murmur: 3/6 early to mid-peaking systolic murmur 3/6, crescendo at 2nd right intercostal space and occasional missed beats Abdomen: obese, soft, non-tender, +BS Extremities: edema trace Pulses: 2+ and symmetric Skin: Skin color, texture, turgor normal. No rashes or lesions Neurologic: Mental status: Alert, oriented, thought content appropriate Psych: Pleasant mood, affect  EKG: Sinus bradycardia with first degree AV block at 56,  PAC  ASSESSMENT: 1. Coronary artery disease status post BMS to the RCA in 2005, recent CABG 2 (LIMA to LAD - for critical left main disease, SVG to OM1)  2. Obesity 3. Hypertension-controlled 4. Dyslipidemia 5. Prior stroke 6. Diabetic neuropathy 7. Diabetic nephropathy 8. Type 1 diabetes on insulin 9. LE edema 10. Mild aortic stenosis  PLAN: 1.   [name] is recovering from recent 2 vessel bypass. He had critical left main disease which became apparent during an infection. Fortunately he is getting stronger and is going to start cardiac rehabilitation. I reviewed his medications and they do seem to be appropriate. He reports being a little "swimmy headed" with exertion. I suspect this may be from high dose metoprolol. His dose was doubled in the hospital. I recommend decreasing that down to 37.5 mg daily. I'm encouraging him to start cardiac rehabilitation later today. He's asked about driving and I think that's okay now. Follow-up with me will be in 3 months for close monitoring.   Pixie Casino, Randall Wilkins, [facility] Randall Wilkins Cardiologist [place] 05/15/2015, 5:51 PM

## 2015-05-17 ENCOUNTER — Encounter (HOSPITAL_COMMUNITY)
Admission: RE | Admit: 2015-05-17 | Discharge: 2015-05-17 | Disposition: A | Discharge status: Home / Self Care | Payer: Medicare Other | Source: Ambulatory Visit | Attending: Internal Medicine | Admitting: Internal Medicine

## 2015-05-17 DIAGNOSIS — I214 Non-ST elevation (NSTEMI) myocardial infarction: Secondary | ICD-10-CM | POA: Diagnosis not present

## 2015-05-17 LAB — GLUCOSE, CAPILLARY
Glucose-Capillary: 144 mg/dL — ABNORMAL HIGH (ref 65–99)
Glucose-Capillary: 153 mg/dL — ABNORMAL HIGH (ref 65–99)

## 2015-05-17 NOTE — Progress Notes (Signed)
Quality of Life Survey Results Review - Pt completed Quality of Life Survey as a participant.  Pt with scores well above 21.  Overall 27.61, Health and Function 25.50, socioeconomic 28.00, Physical and Spiritual 30 and Family 30.Scores less than 21 are considerd low.  Pt with no further needs identified.  No further interventions needed. Continue to monitor and intervene as needed. Cherre Huger, BSN

## 2015-05-20 ENCOUNTER — Encounter (HOSPITAL_COMMUNITY)
Admission: RE | Admit: 2015-05-20 | Discharge: 2015-05-20 | Disposition: A | Discharge status: Home / Self Care | Payer: Medicare Other | Source: Ambulatory Visit | Attending: Internal Medicine | Admitting: Internal Medicine

## 2015-05-20 DIAGNOSIS — I214 Non-ST elevation (NSTEMI) myocardial infarction: Secondary | ICD-10-CM | POA: Diagnosis not present

## 2015-05-20 LAB — GLUCOSE, CAPILLARY
Glucose-Capillary: 212 mg/dL — ABNORMAL HIGH (ref 65–99)
Glucose-Capillary: 159 mg/dL — ABNORMAL HIGH (ref 65–99)

## 2015-05-22 ENCOUNTER — Encounter (HOSPITAL_COMMUNITY)
Admission: RE | Admit: 2015-05-22 | Discharge: 2015-05-22 | Disposition: A | Discharge status: Home / Self Care | Payer: Medicare Other | Source: Ambulatory Visit | Attending: Internal Medicine | Admitting: Internal Medicine

## 2015-05-22 DIAGNOSIS — I214 Non-ST elevation (NSTEMI) myocardial infarction: Secondary | ICD-10-CM | POA: Diagnosis not present

## 2015-05-22 LAB — GLUCOSE, CAPILLARY
Glucose-Capillary: 111 mg/dL — ABNORMAL HIGH (ref 65–99)
Glucose-Capillary: 128 mg/dL — ABNORMAL HIGH (ref 65–99)

## 2015-05-22 NOTE — Progress Notes (Signed)
Reviewed home exercise with pt and wife today.  Pt plans to walk at home using walker for exercise.  Reviewed THR, pulse, RPE, sign and symptoms, and when to call 911 or MD.  He struggles to find his own pulse, so he was encouraged to focus on RPE and to let wife help check pulse.  Pt voiced understanding. Alberteen Sam, MA, ACSM RCEP

## 2015-05-24 ENCOUNTER — Encounter (HOSPITAL_COMMUNITY)
Admission: RE | Admit: 2015-05-24 | Discharge: 2015-05-24 | Disposition: A | Discharge status: Home / Self Care | Payer: Medicare Other | Source: Ambulatory Visit | Attending: Internal Medicine | Admitting: Internal Medicine

## 2015-05-24 DIAGNOSIS — I214 Non-ST elevation (NSTEMI) myocardial infarction: Secondary | ICD-10-CM | POA: Diagnosis not present

## 2015-05-24 LAB — GLUCOSE, CAPILLARY
Glucose-Capillary: 123 mg/dL — ABNORMAL HIGH (ref 65–99)
Glucose-Capillary: 127 mg/dL — ABNORMAL HIGH (ref 65–99)

## 2015-05-27 ENCOUNTER — Encounter (HOSPITAL_COMMUNITY)
Admission: RE | Admit: 2015-05-27 | Discharge: 2015-05-27 | Disposition: A | Discharge status: Home / Self Care | Payer: Medicare Other | Source: Ambulatory Visit | Attending: Internal Medicine | Admitting: Internal Medicine

## 2015-05-27 DIAGNOSIS — I214 Non-ST elevation (NSTEMI) myocardial infarction: Secondary | ICD-10-CM | POA: Diagnosis not present

## 2015-05-27 LAB — GLUCOSE, CAPILLARY
Glucose-Capillary: 146 mg/dL — ABNORMAL HIGH (ref 65–99)
Glucose-Capillary: 132 mg/dL — ABNORMAL HIGH (ref 65–99)

## 2015-05-29 ENCOUNTER — Encounter (HOSPITAL_COMMUNITY)
Admission: RE | Admit: 2015-05-29 | Discharge: 2015-05-29 | Disposition: A | Discharge status: Home / Self Care | Payer: Medicare Other | Source: Ambulatory Visit | Attending: Internal Medicine | Admitting: Internal Medicine

## 2015-05-29 DIAGNOSIS — I214 Non-ST elevation (NSTEMI) myocardial infarction: Secondary | ICD-10-CM | POA: Diagnosis not present

## 2015-05-29 LAB — GLUCOSE, CAPILLARY
Glucose-Capillary: 162 mg/dL — ABNORMAL HIGH (ref 65–99)
Glucose-Capillary: 144 mg/dL — ABNORMAL HIGH (ref 65–99)

## 2015-05-31 ENCOUNTER — Encounter (HOSPITAL_COMMUNITY)
Admission: RE | Admit: 2015-05-31 | Discharge: 2015-05-31 | Disposition: A | Discharge status: Home / Self Care | Payer: Medicare Other | Source: Ambulatory Visit | Attending: Internal Medicine | Admitting: Internal Medicine

## 2015-05-31 DIAGNOSIS — I214 Non-ST elevation (NSTEMI) myocardial infarction: Secondary | ICD-10-CM | POA: Diagnosis not present

## 2015-05-31 LAB — GLUCOSE, CAPILLARY
Glucose-Capillary: 189 mg/dL — ABNORMAL HIGH (ref 65–99)
Glucose-Capillary: 196 mg/dL — ABNORMAL HIGH (ref 65–99)

## 2015-06-03 ENCOUNTER — Encounter (HOSPITAL_COMMUNITY)
Admission: RE | Admit: 2015-06-03 | Discharge: 2015-06-03 | Disposition: A | Discharge status: Home / Self Care | Payer: Medicare Other | Source: Ambulatory Visit | Attending: Internal Medicine | Admitting: Internal Medicine

## 2015-06-03 DIAGNOSIS — I214 Non-ST elevation (NSTEMI) myocardial infarction: Secondary | ICD-10-CM | POA: Diagnosis not present

## 2015-06-03 LAB — GLUCOSE, CAPILLARY
Glucose-Capillary: 114 mg/dL — ABNORMAL HIGH (ref 65–99)
Glucose-Capillary: 107 mg/dL — ABNORMAL HIGH (ref 65–99)

## 2015-06-05 ENCOUNTER — Encounter (HOSPITAL_COMMUNITY)
Admission: RE | Admit: 2015-06-05 | Discharge: 2015-06-05 | Disposition: A | Discharge status: Home / Self Care | Payer: Medicare Other | Source: Ambulatory Visit | Attending: Internal Medicine | Admitting: Internal Medicine

## 2015-06-05 DIAGNOSIS — I214 Non-ST elevation (NSTEMI) myocardial infarction: Secondary | ICD-10-CM | POA: Diagnosis not present

## 2015-06-05 LAB — GLUCOSE, CAPILLARY
Glucose-Capillary: 110 mg/dL — ABNORMAL HIGH (ref 65–99)
Glucose-Capillary: 115 mg/dL — ABNORMAL HIGH (ref 65–99)

## 2015-06-07 ENCOUNTER — Encounter (HOSPITAL_COMMUNITY)
Admission: RE | Admit: 2015-06-07 | Discharge: 2015-06-07 | Disposition: A | Discharge status: Home / Self Care | Payer: Medicare Other | Source: Ambulatory Visit | Attending: Internal Medicine | Admitting: Internal Medicine

## 2015-06-07 DIAGNOSIS — I214 Non-ST elevation (NSTEMI) myocardial infarction: Secondary | ICD-10-CM | POA: Diagnosis not present

## 2015-06-07 LAB — GLUCOSE, CAPILLARY: Glucose-Capillary: 134 mg/dL — ABNORMAL HIGH (ref 65–99)

## 2015-06-10 ENCOUNTER — Encounter (HOSPITAL_COMMUNITY)
Admission: RE | Admit: 2015-06-10 | Discharge: 2015-06-10 | Disposition: A | Discharge status: Home / Self Care | Payer: Medicare Other | Source: Ambulatory Visit | Attending: Internal Medicine | Admitting: Internal Medicine

## 2015-06-10 DIAGNOSIS — I214 Non-ST elevation (NSTEMI) myocardial infarction: Secondary | ICD-10-CM | POA: Diagnosis not present

## 2015-06-10 LAB — GLUCOSE, CAPILLARY
Glucose-Capillary: 101 mg/dL — ABNORMAL HIGH (ref 65–99)
Glucose-Capillary: 109 mg/dL — ABNORMAL HIGH (ref 65–99)
Glucose-Capillary: 122 mg/dL — ABNORMAL HIGH (ref 65–99)

## 2015-06-10 NOTE — Progress Notes (Signed)
Randall Wilkins 74 y.o. male Nutrition Note Spoke with pt. Nutrition Plan and Nutrition Survey goals reviewed with pt. Pt is following Step 2 of the Therapeutic Lifestyle Changes diet. Pt wants to lose wt. Wt loss tips reviewed. Pt is diabetic. Last A1c indicates blood glucose not optimally controlled. This Probation officer went over Diabetes Education test results. Pt checks fasting CBG's daily, which typically range from 134-140 mg/dL. Pt states his fasting CBG this morning was 185 mg/dL.Pt reports he ate chocolate cake yesterday, "which I don't typically do." Pt reports he ate 3 mini powdered donuts this morning "because I didn't feel like eggs today. Pre-exercise CBG 101 mg/dL. Eating a well-balanced meal before exercise emphasized. Pt with dx of CHF. Per discussion, pt does not use canned/convenience foods often. Pt states he rinses canned food "if it has salt in it." Pt rarely adds salt to food.  Pt expressed understanding of the information reviewed. Pt aware of nutrition education classes offered.  Lab Results  Component Value Date   HGBA1C 7.7* 03/29/2015    Nutrition Diagnosis ? Food-and nutrition-related knowledge deficit related to lack of exposure to information as related to diagnosis of: ? CVD ? DM  ? Obesity related to excessive energy intake as evidenced by a BMI of 39.0  Nutrition RX/ Estimated Daily Nutrition Needs for: wt loss 1650-2150 Kcal, 45-60 gm fat, 10-14 gm sat fat, 1.6-2.2 gm trans-fat, <1500 mg sodium, 175-250 gm CHO   Nutrition Intervention ? Pt's individual nutrition plan reviewed with pt. ? Benefits of adopting Therapeutic Lifestyle Changes discussed when Medficts reviewed. ? Pt to attend the Portion Distortion class - met; 06/07/15 ? Pt to attend the Diabetes Q & A class - met; 05/24/15  ? Pt given handouts for: ? Nutrition I class ? Nutrition II class ? Diabetes Blitz Class ? Continue client-centered nutrition education by RD, as part of interdisciplinary  care. Goal(s) ? Pt to identify food quantities necessary to achieve: ? wt loss to a goal wt of 234-252 lb (106.3-114.5 kg) at graduation from cardiac rehab.  ? Pt to describe the benefit of including fruits, vegetables, whole grains, and low-fat dairy products in a heart healthy meal plan. ? CBG concentrations in the normal range or as close to normal as is safely possible. Monitor and Evaluate progress toward nutrition goal with team. Nutrition Risk: Change to Moderate Derek Mound, M.Ed, RD, LDN, CDE 06/10/2015 12:08 PM

## 2015-06-12 ENCOUNTER — Encounter (HOSPITAL_COMMUNITY)
Admission: RE | Admit: 2015-06-12 | Discharge: 2015-06-12 | Disposition: A | Discharge status: Home / Self Care | Payer: Medicare Other | Source: Ambulatory Visit | Attending: Internal Medicine | Admitting: Internal Medicine

## 2015-06-12 DIAGNOSIS — I214 Non-ST elevation (NSTEMI) myocardial infarction: Secondary | ICD-10-CM | POA: Diagnosis not present

## 2015-06-12 LAB — GLUCOSE, CAPILLARY: Glucose-Capillary: 90 mg/dL (ref 65–99)

## 2015-06-12 NOTE — Progress Notes (Signed)
CBG 90 this morning at cardiac rehab. Randall Wilkins said he ate banana bread at home for breakfast this morning. Randall Wilkins did not exercise due to low CBG.  Patient given a banana. I encouraged Randall Wilkins to eat some protein prior to coming to exercise. Randall Wilkins wife Randall Wilkins picked the patient up and took him to lunch to eat. Randall Wilkins plans to return to exercise next Wednesday. Randall Wilkins will be absent on Friday.

## 2015-06-14 ENCOUNTER — Encounter (HOSPITAL_COMMUNITY): Admission: RE | Admit: 2015-06-14 | Payer: Medicare Other | Source: Ambulatory Visit

## 2015-06-19 ENCOUNTER — Encounter (HOSPITAL_COMMUNITY)
Admission: RE | Admit: 2015-06-19 | Discharge: 2015-06-19 | Disposition: A | Discharge status: Home / Self Care | Payer: Medicare Other | Source: Ambulatory Visit | Attending: Internal Medicine | Admitting: Internal Medicine

## 2015-06-19 DIAGNOSIS — Z951 Presence of aortocoronary bypass graft: Secondary | ICD-10-CM | POA: Diagnosis present

## 2015-06-19 DIAGNOSIS — I214 Non-ST elevation (NSTEMI) myocardial infarction: Secondary | ICD-10-CM | POA: Diagnosis present

## 2015-06-19 LAB — GLUCOSE, CAPILLARY: Glucose-Capillary: 153 mg/dL — ABNORMAL HIGH (ref 65–99)

## 2015-06-20 NOTE — Progress Notes (Signed)
  30 day Psychosocial followup assessment  Patient psychosocial assessment reveals no barriers to cardiac rehab participation.  Psychosocial areas that are affecting patient's rehab experience include concerns about mobility.  Patient  does  Exhibit positive healthy coping skills to deal with psychosocial concerns.  Patient at times  Feel he is making progress towards cardiac rehab goals. However pt does not engage in home exercise that would greatly benefit him.  Pt uses walker in rehab for assistive device otherwise pt uses cane. Questioned pt regarding the safety of ambulation and the need to use a walker at home.  Pt reluctant to try. Difficult to motivate pt to increase activity on his off days. Patient reports feeling complacent about current and projected progress toward cardiac rehab goals.  Patient's rate of progress towards goals is fair.  Plan of action to help patient continue to work towards rehab goals include continued motivation and seeking the support of his wife.  Will continue to monitor and evaluate progress toward psychosocial goals.   Goals in progress: Help patient work toward returning to meaningful activities that improve patient's quality of life and are attainable.  Cherre Huger, BSN

## 2015-06-21 ENCOUNTER — Encounter (HOSPITAL_COMMUNITY)
Admission: RE | Admit: 2015-06-21 | Discharge: 2015-06-21 | Disposition: A | Discharge status: Home / Self Care | Payer: Medicare Other | Source: Ambulatory Visit | Attending: Internal Medicine | Admitting: Internal Medicine

## 2015-06-21 DIAGNOSIS — I214 Non-ST elevation (NSTEMI) myocardial infarction: Secondary | ICD-10-CM | POA: Diagnosis not present

## 2015-06-21 LAB — GLUCOSE, CAPILLARY: Glucose-Capillary: 163 mg/dL — ABNORMAL HIGH (ref 65–99)

## 2015-06-24 ENCOUNTER — Encounter (HOSPITAL_COMMUNITY)
Admission: RE | Admit: 2015-06-24 | Discharge: 2015-06-24 | Disposition: A | Discharge status: Home / Self Care | Payer: Medicare Other | Source: Ambulatory Visit | Attending: Internal Medicine | Admitting: Internal Medicine

## 2015-06-24 DIAGNOSIS — I214 Non-ST elevation (NSTEMI) myocardial infarction: Secondary | ICD-10-CM | POA: Diagnosis not present

## 2015-06-24 LAB — GLUCOSE, CAPILLARY: Glucose-Capillary: 179 mg/dL — ABNORMAL HIGH (ref 65–99)

## 2015-06-26 ENCOUNTER — Encounter (HOSPITAL_COMMUNITY)
Admission: RE | Admit: 2015-06-26 | Discharge: 2015-06-26 | Disposition: A | Discharge status: Home / Self Care | Payer: Medicare Other | Source: Ambulatory Visit | Attending: Internal Medicine | Admitting: Internal Medicine

## 2015-06-26 DIAGNOSIS — I214 Non-ST elevation (NSTEMI) myocardial infarction: Secondary | ICD-10-CM | POA: Diagnosis not present

## 2015-06-27 LAB — GLUCOSE, CAPILLARY: Glucose-Capillary: 235 mg/dL — ABNORMAL HIGH (ref 65–99)

## 2015-06-28 ENCOUNTER — Encounter (HOSPITAL_COMMUNITY)
Admission: RE | Admit: 2015-06-28 | Discharge: 2015-06-28 | Disposition: A | Discharge status: Home / Self Care | Payer: Medicare Other | Source: Ambulatory Visit | Attending: Internal Medicine | Admitting: Internal Medicine

## 2015-06-28 DIAGNOSIS — I214 Non-ST elevation (NSTEMI) myocardial infarction: Secondary | ICD-10-CM | POA: Diagnosis not present

## 2015-06-28 LAB — GLUCOSE, CAPILLARY
Glucose-Capillary: 126 mg/dL — ABNORMAL HIGH (ref 65–99)
Glucose-Capillary: 137 mg/dL — ABNORMAL HIGH (ref 65–99)

## 2015-07-01 ENCOUNTER — Ambulatory Visit: Payer: Medicare Other | Admitting: Thoracic Surgery (Cardiothoracic Vascular Surgery)

## 2015-07-01 ENCOUNTER — Encounter (HOSPITAL_COMMUNITY): Payer: Medicare Other

## 2015-07-02 ENCOUNTER — Telehealth (HOSPITAL_COMMUNITY): Payer: Self-pay | Admitting: *Deleted

## 2015-07-03 ENCOUNTER — Encounter (HOSPITAL_COMMUNITY): Payer: Medicare Other

## 2015-07-05 ENCOUNTER — Encounter (HOSPITAL_COMMUNITY): Payer: Medicare Other

## 2015-07-08 ENCOUNTER — Encounter (HOSPITAL_COMMUNITY): Payer: Medicare Other

## 2015-07-10 ENCOUNTER — Encounter (HOSPITAL_COMMUNITY): Payer: Medicare Other

## 2015-07-12 ENCOUNTER — Encounter (HOSPITAL_COMMUNITY): Payer: Medicare Other

## 2015-07-15 ENCOUNTER — Encounter (HOSPITAL_COMMUNITY): Payer: Medicare Other

## 2015-07-17 ENCOUNTER — Encounter (HOSPITAL_COMMUNITY): Payer: Medicare Other

## 2015-07-19 ENCOUNTER — Encounter (HOSPITAL_COMMUNITY): Payer: Medicare Other

## 2015-07-22 ENCOUNTER — Encounter (HOSPITAL_COMMUNITY): Payer: Medicare Other

## 2015-07-24 ENCOUNTER — Encounter (HOSPITAL_COMMUNITY): Payer: Medicare Other

## 2015-07-26 ENCOUNTER — Encounter (HOSPITAL_COMMUNITY): Payer: Medicare Other

## 2015-07-29 ENCOUNTER — Ambulatory Visit (INDEPENDENT_AMBULATORY_CARE_PROVIDER_SITE_OTHER): Payer: Medicare Other | Admitting: Thoracic Surgery (Cardiothoracic Vascular Surgery)

## 2015-07-29 ENCOUNTER — Encounter: Payer: Self-pay | Admitting: Thoracic Surgery (Cardiothoracic Vascular Surgery)

## 2015-07-29 ENCOUNTER — Encounter (HOSPITAL_COMMUNITY): Payer: Medicare Other

## 2015-07-29 VITALS — BP 140/64 | HR 67 | Resp 16 | Ht 69.0 in | Wt 265.0 lb

## 2015-07-29 DIAGNOSIS — Z951 Presence of aortocoronary bypass graft: Secondary | ICD-10-CM

## 2015-07-29 DIAGNOSIS — I2510 Atherosclerotic heart disease of native coronary artery without angina pectoris: Secondary | ICD-10-CM | POA: Diagnosis not present

## 2015-07-29 NOTE — Progress Notes (Signed)
[address]       [place] [address]             [phone]     CARDIOTHORACIC SURGERY OFFICE NOTE  Referring Provider is Pixie Casino, MD PCP is Simona Huh, MD   HPI:  Patient returns for routine follow-up status post coronary artery bypass grafting 2 on 03/29/2015 for critical left main coronary artery disease status post acute non-ST segment elevation myocardial infarction. His postoperative recovery has been uncomplicated, and he was last seen in our office on 04/29/2015.  Since then he was seen in follow-up by Dr. Debara Pickett on 05/15/2015. Patient returns for routine follow-up today and states that he feels very well. Unfortunately, he had to quit the outpatient cardiac rehabilitation program because of problems with severe degenerative arthritis in his right knee. He states that he feels better than he has in a long time. He denies any exertional chest discomfort or shortness of breath. Overall he has no complaints. He claims that his diabetes is under good control. At the time of his surgery his hemoglobin A1c measured 7.7.   Current Outpatient Prescriptions  Medication Sig Dispense Refill  . allopurinol (ZYLOPRIM) 100 MG tablet Take 300 mg by mouth daily.     Marland Kitchen amitriptyline (ELAVIL) 50 MG tablet Take 50 mg by mouth 2 (two) times daily.     Marland Kitchen aspirin 81 MG tablet Take 1 tablet (81 mg total) by mouth daily.    . clopidogrel (PLAVIX) 75 MG tablet Take 75 mg by mouth daily.    . colchicine 0.6 MG tablet Take 0.6 mg by mouth daily as needed.    . donepezil (ARICEPT) 10 MG tablet Take 1 tablet (10 mg total) by mouth daily. 90 tablet 3  . fish oil-omega-3 fatty acids 1000 MG capsule Take 1 g by mouth 2 (two) times daily.     Marland Kitchen HYDROcodone-acetaminophen (VICODIN) 5-500 MG per tablet Take 2 tablets by mouth at bedtime.     . Insulin Glargine 300 UNIT/ML SOPN Inject 60 Units into the skin every morning.    . insulin lispro (HUMALOG) 100 UNIT/ML injection  Inject 20-25 Units into the skin 4 (four) times daily. Takes 25u in am, 20u at lunch, 20u in evening and 25u at bedtime    . lisinopril (PRINIVIL,ZESTRIL) 5 MG tablet Take 5 mg by mouth daily.     . metoprolol succinate (TOPROL-XL) 25 MG 24 hr tablet Take 37.5 mg by mouth daily.    . NON FORMULARY Inhale 1 application into the lungs at bedtime. CPAP    . pantoprazole (PROTONIX) 40 MG tablet Take 1 tablet (40 mg total) by mouth 2 (two) times daily. (Patient taking differently: Take 40 mg by mouth daily. ) 60 tablet 3  . potassium chloride SA (K-DUR,KLOR-CON) 20 MEQ tablet Take 20 mEq by mouth daily.     . rosuvastatin (CRESTOR) 20 MG tablet Take 20 mg by mouth daily.    . Tamsulosin HCl (FLOMAX) 0.4 MG CAPS Take 0.4 mg by mouth daily.     Marland Kitchen torsemide (DEMADEX) 10 MG tablet Take 50 mg by mouth daily.     No current facility-administered medications for this visit.      Physical Exam:   BP 140/64 mmHg  Pulse 67  Resp 16  Ht '5\' 9"'$  (1.753 m)  Wt 265 lb (120.203 kg)  BMI 39.12 kg/m2  SpO2 97%  General:  Obese but well appearing  Chest:  Clear to auscultation  CV:   Regular rate and rhythm with systolic murmur  Incisions:  Completely healed, sternum is stable  Abdomen:  Soft and nontender  Extremities:  Warm and well perfused  Diagnostic Tests:  n/a   Impression:  Patient is doing well 4 months status post coronary artery bypass grafting  Plan:  I have encouraged patient to continue to increase his activity without any limitations at this time. He has been reminded regarding the importance of weight loss and regular exercise. He has been reminded regarding the importance of keeping his diabetes under very strict control. We have not recommended any changes to his current medications at this time. All of his questions been addressed.  The patient will return for follow-up next April, approximately 1 year following his original surgery.  I spent in excess of 10 minutes during the  conduct of this office consultation and >50% of this time involved direct face-to-face encounter with the patient for counseling and/or coordination of their care.    Valentina Gu. Roxy Manns, MD 07/29/2015 10:55 AM

## 2015-07-29 NOTE — Patient Instructions (Signed)
Patient may resume unrestricted physical activity without any particular limitations at this time.  The patient is reminded to make every effort to keep their diabetes under very tight control.  They should follow up closely with their primary care physician or endocrinologist and strive to keep their hemoglobin A1c levels as low as possible, preferably near or below 6.0  The patient is reminded of the numerous potential long-term benefits of regular exercise and adherence to a "heart healthy" diet

## 2015-07-31 ENCOUNTER — Encounter (HOSPITAL_COMMUNITY): Payer: Medicare Other

## 2015-08-02 ENCOUNTER — Encounter (HOSPITAL_COMMUNITY): Payer: Medicare Other

## 2015-08-05 ENCOUNTER — Encounter (HOSPITAL_COMMUNITY): Payer: Medicare Other

## 2015-08-07 ENCOUNTER — Encounter (HOSPITAL_COMMUNITY): Payer: Medicare Other

## 2015-08-09 ENCOUNTER — Encounter (HOSPITAL_COMMUNITY): Payer: Medicare Other

## 2015-08-12 ENCOUNTER — Encounter (HOSPITAL_COMMUNITY): Payer: Medicare Other

## 2015-08-14 ENCOUNTER — Encounter (HOSPITAL_COMMUNITY): Payer: Medicare Other

## 2015-08-16 ENCOUNTER — Encounter (HOSPITAL_COMMUNITY): Payer: Medicare Other

## 2015-08-26 ENCOUNTER — Encounter: Payer: Self-pay | Admitting: Internal Medicine

## 2015-08-26 ENCOUNTER — Ambulatory Visit (INDEPENDENT_AMBULATORY_CARE_PROVIDER_SITE_OTHER): Payer: Medicare Other | Admitting: Internal Medicine

## 2015-08-26 VITALS — BP 132/72 | HR 58 | Ht 69.0 in | Wt 266.8 lb

## 2015-08-26 DIAGNOSIS — I2510 Atherosclerotic heart disease of native coronary artery without angina pectoris: Secondary | ICD-10-CM

## 2015-08-26 DIAGNOSIS — I519 Heart disease, unspecified: Secondary | ICD-10-CM

## 2015-08-26 DIAGNOSIS — Z951 Presence of aortocoronary bypass graft: Secondary | ICD-10-CM | POA: Diagnosis not present

## 2015-08-26 DIAGNOSIS — Z9861 Coronary angioplasty status: Secondary | ICD-10-CM

## 2015-08-26 DIAGNOSIS — E139 Other specified diabetes mellitus without complications: Secondary | ICD-10-CM

## 2015-08-26 NOTE — Patient Instructions (Signed)
Your physician has recommended you make the following change in your medication: NO CHANGES  Your physician recommends that you schedule a follow-up appointment in: [place] DR. HILTY

## 2015-08-26 NOTE — Progress Notes (Signed)
OFFICE NOTE  Chief Complaint:  Routinel follow-up, no complaints  Primary Care Physician: Simona Huh, MD  HPI:  Randall Wilkins is a pleasant 74 year old male patient who was formerly followed by Dr. Rollene Fare. He is here to establish cardiac care with [place] today. He has a history of obesity, multiple comorbidities including insulin-dependent diabetes, diabetic peripheral neuropathy, diabetic nephropathy, venous insufficiency and peripheral neuropathy. He also has obstructive sleep apnea on CPAP and coronary artery disease. He unfortunately suffered a left parietal stroke with small lacunar infarcts noted on MRI in 2013. He is a left foot drop from an L4/L5 neuropathy and wears a brace for this. From a cardiac standpoint he had a large non-drug-eluting stent placed in the RCA in 2005. He has had a stent placed at 89 to a diagonal which was noted to be patent. He's had some problems with dizziness which is improved. He [place] as large to medium secondary to neuropathy and venous insufficiency. Unfortunately not been able to lose a lot of weight due to his difficulty in ambulating and painful neuropathy.  He denies any cardiac chest pain or worsening shortness of breath.  Randall Wilkins returns today for a six-month followup. He is having no complaints. He does report some lower extremity swelling which is stable. He has mild aortic stenosis which is been stable as well. His last echo was in August 2014. He denies any chest pain or worsening shortness of breath. He recently had lab work to Dr. Eugenio Hoes office which we will obtain.  I saw Randall Wilkins back today for hospital follow-up. He is reportedly doing much better. He has more energy and no significant chest discomfort. Between our last appointment see presented with a urinary tract infection and ultimately had chest pain and profuse diaphoresis. He was found to have elevated troponin and underwent cardiac catheterization. This demonstrated the  following:  Coronary angiography: Coronary dominance: right  Left mainstem: 95% distal left main stenosis.  Left anterior descending (LAD): There is 95% ostial LAD stenosis. The LAD is a large vessel. The LAD and diagonal otherwise are without significant disease.  There is a large ramus intermediate branch with 99% ostial stenosis.  Left circumflex (LCx): 100% ostial occlusion with faint left to left collaterals.  Right coronary artery (RCA): the RCA is a large dominant branch. The stent in the proximal vessel is widely patent. There is a 60% stenosis in the mid PDA. The third PLOM is moderate in size with a long 90-95% stenosis prior to distal arborization.   Subsequently he underwent emergent coronary artery bypass grafting with a LIMA to LAD and SVG to OM branch. He is noted to have mild aortic sclerosis versus stenosis, however the valve was not severe enough to be addressed. Since discharge he is more active and is starting cardiac rehabilitation today.  Randall Wilkins returns today for follow-up. Overall he is feeling well and continues to improve in his ambulation and stamina. He denies any chest pain or shortness of breath. He is rehabilitating well. Recent laboratory work shows excellent cholesterol control and total cholesterol 130, HDL 42 LDL 61 and triglycerides 137. His only concern is the cost of medications.  PMHx:  Past Medical History  Diagnosis Date  . Rhabdomyolysis   . Memory loss   . Exogenous obesity   . Insulin dependent diabetes mellitus   . Peripheral neuropathy   . Venous insufficiency   . CAD (coronary artery disease)   . Hyperlipidemia   . Gout   .  Stroke     L patietal with small scattered lacunar infarcts  . Left foot drop   . Hypertension   . History of nuclear stress test 07/2011    dipyridamole; fixed inferolateral defect, worse at stress than rest; no reversible ischemia; low risk scan   . OSA on CPAP     uses a cpap  . Heart attack   . Aortic  stenosis, mild 11/14/2013  . Bilateral leg edema 05/21/2014  . Diabetic peripheral neuropathy associated with type 1 diabetes mellitus 11/14/2013  . Obesity (BMI 30-39.9) 11/14/2013  . Thrombocytopenia 03/21/2015  . Obstructive sleep apnea 03/21/2015  . Dyspnea on exertion 03/21/2015  . Diabetes 1.5, managed as type 1 02/04/2013  . Weakness generalized 03/21/2015  . Chronic diastolic congestive heart failure   . Chronic kidney disease (CKD), stage III (moderate)   . Dysphagia   . Peptic ulcer with hemorrhage 03/28/2015  . Left main coronary artery disease 03/28/2015  . S/P CABG x 2 03/29/2015    LIMA to Diagonal, SVG to OM, EVH via right thigh  . Aortic valve sclerosis 03/29/2015    Past Surgical History  Procedure Laterality Date  . Cholecystectomy    . Sinus endo w/fusion    . Coronary angioplasty  10/13/1996  . Rotator cuff repair Right   . Transthoracic echocardiogram  08/08/2013    EF 55-60%, mild conc hypertrophy, grade 1 diastolic dysfunction; AV with mild stenosis; LA & RA mildly dilated  . Coronary angioplasty  09/21/1989    emergency PTCA  . Coronary angioplasty  10/13/1996    Multi-Link diagonal & OD stenting (Dr. Marella Chimes)  . Coronary angioplasty  12/03/1997    disease of mid DX-1 ~50% & in mid PLA & PDA (distal lesions) (Dr. Marella Chimes)   . Coronary angioplasty  10/14/1999    progression of disease distal PLA & PDA; progression of disease prox RCA - moderate (Dr. Marella Chimes)   . Coronary angioplasty with stent placement  04/04/2004    4.0x54m non-DES (thrombectomy via AngioJet) to RCA for high grade stenosis (Dr. RMarella Chimes  . Carotid doppler  03/2013    bilat bulb/prox ICAs - mild amount of fibrous plaque with no evidence of diameter reduction  . Back surgery  2002    lumbosacral  . Tonsillectomy    . Colonoscopy    . Carpal tunnel release Bilateral 08/09/2014    Procedure: BILATERAL CARPAL TUNNEL RELEASE;  Surgeon: GDaryll Brod MD;  [facility]   Service: Orthopedics;  Laterality: Bilateral;  ANESTHESIA:  IV REGIONAL BIL FAB  . Esophagogastroduodenoscopy N/A 03/27/2015    Procedure: ESOPHAGOGASTRODUODENOSCOPY (EGD);  Surgeon: MClarene Essex MD;  [facility]  Service: Endoscopy;  Laterality: N/A;  possible dilation  . Left heart catheterization with coronary angiogram N/A 03/28/2015    Procedure: LEFT HEART CATHETERIZATION WITH CORONARY ANGIOGRAM;  Surgeon: Peter M JMartinique MD;  [facility];  Service: Cardiovascular;  Laterality: N/A;  . Coronary artery bypass graft N/A 03/29/2015    Procedure: CORONARY ARTERY BYPASS GRAFTING TIMES TWO USING LEFT INTERNAL MAMMARY ARTERY AND RIGHT LEG GREATER SAPHENOUS VEIN HARVESTED ENDOSCOPICALLY.;  Surgeon: CRexene Alberts MD;  [facility]  Service: Open Heart Surgery;  Laterality: N/A;    FAMHx:  Family History  Problem Relation Age of Onset  . Coronary artery disease Father   . Heart disease Mother   . Cancer Maternal Grandmother   . Heart Problems Maternal Grandfather  SOCHx:   reports that he quit smoking about 42 years ago. His smoking use included Cigarettes. He has a 40 pack-year smoking history. He has never used smokeless tobacco. He reports that he does not drink alcohol or use illicit drugs.  ALLERGIES:  Allergies  Allergen Reactions  . Actos [Pioglitazone] Swelling  . Metformin And Related Nausea Only  . Niaspan [Niacin Er] Itching and Rash    ROS: A comprehensive review of systems was negative.  HOME MEDS: Current Outpatient Prescriptions  Medication Sig Dispense Refill  . allopurinol (ZYLOPRIM) 100 MG tablet Take 300 mg by mouth daily.     Marland Kitchen amitriptyline (ELAVIL) 50 MG tablet Take 50 mg by mouth 2 (two) times daily.     Marland Kitchen aspirin 81 MG tablet Take 1 tablet (81 mg total) by mouth daily.    . clopidogrel (PLAVIX) 75 MG tablet Take 75 mg by mouth daily.    . colchicine 0.6 MG tablet Take 0.6 mg by mouth daily as needed.    . donepezil (ARICEPT) 10  MG tablet Take 1 tablet (10 mg total) by mouth daily. 90 tablet 3  . fish oil-omega-3 fatty acids 1000 MG capsule Take 1 g by mouth 2 (two) times daily.     Marland Kitchen HYDROcodone-acetaminophen (VICODIN) 5-500 MG per tablet Take 2 tablets by mouth at bedtime.     . Insulin Glargine 300 UNIT/ML SOPN Inject 60 Units into the skin every morning.    . insulin lispro (HUMALOG) 100 UNIT/ML injection Inject 20-25 Units into the skin 4 (four) times daily. Takes 25u in am, 20u at lunch, 20u in evening and 25u at bedtime    . lisinopril (PRINIVIL,ZESTRIL) 5 MG tablet Take 5 mg by mouth daily.     . metoprolol succinate (TOPROL-XL) 25 MG 24 hr tablet Take 37.5 mg by mouth daily.    . NON FORMULARY Inhale 1 application into the lungs at bedtime. CPAP    . pantoprazole (PROTONIX) 40 MG tablet Take 1 tablet (40 mg total) by mouth 2 (two) times daily. (Patient taking differently: Take 40 mg by mouth daily. ) 60 tablet 3  . potassium chloride SA (K-DUR,KLOR-CON) 20 MEQ tablet Take 20 mEq by mouth daily.     . rosuvastatin (CRESTOR) 20 MG tablet Take 20 mg by mouth daily.    . Tamsulosin HCl (FLOMAX) 0.4 MG CAPS Take 0.4 mg by mouth daily.     Marland Kitchen torsemide (DEMADEX) 10 MG tablet Take 50 mg by mouth daily.     No current facility-administered medications for this visit.    LABS/IMAGING: No results found for this or any previous visit (from the past 48 hour(s)). No results found.  VITALS: BP 132/72 mmHg  Pulse 58  Ht '5\' 9"'$  (1.753 m)  Wt 266 lb 12.8 oz (121.02 kg)  BMI 39.38 kg/m2  EXAM: General appearance: alert and no distress Neck: no carotid bruit and no JVD Lungs: clear to auscultation bilaterally Heart: regular rate and rhythm, S1, S2 normal, systolic murmur: 3/6 early to mid-peaking systolic murmur 3/6, crescendo at 2nd right intercostal space and occasional missed beats Abdomen: obese, soft, non-tender, +BS Extremities: edema trace Pulses: 2+ and symmetric Skin: Skin color, texture, turgor normal. No  rashes or lesions Neurologic: Mental status: Alert, oriented, thought content appropriate Psych: Pleasant mood, affect  EKG: Sinus bradycardia with first degree AV block at 58  ASSESSMENT: 1. Coronary artery disease status post BMS to the RCA in 2005, recent CABG 2 (LIMA to LAD -  for critical left main disease, SVG to OM1)  2. Obesity 3. Hypertension-controlled 4. Dyslipidemia 5. Prior stroke 6. Diabetic neuropathy 7. Diabetic nephropathy - left foot drop 8. Type 1 diabetes on insulin 9. LE edema 10. Mild aortic stenosis  PLAN: 1.   Randall Wilkins is doing very well now several months after coronary bypass grafting. He denies any chest pain or shortness of breath. He's improving back to baseline function and has completely recovered from renal failure. He continues to improve with regards to neuropathy and ambulation. Blood pressure is now well controlled. Hemoglobin A1c is at goal. Recent laboratory work also shows a well controlled cholesterol profile on Crestor. I would recommend no changes to his medicines at this time and plan to see him back in 6 months.   Pixie Casino, MD, [facility] Attending Cardiologist  [facility] 08/26/2015, 8:06 AM

## 2015-08-30 ENCOUNTER — Encounter: Payer: Self-pay | Admitting: Internal Medicine

## 2015-09-09 ENCOUNTER — Encounter: Payer: Self-pay | Admitting: Internal Medicine

## 2015-10-18 ENCOUNTER — Other Ambulatory Visit: Payer: Self-pay | Admitting: Gastroenterology

## 2015-10-21 ENCOUNTER — Other Ambulatory Visit: Payer: Self-pay | Admitting: Neurology

## 2015-11-11 ENCOUNTER — Other Ambulatory Visit: Payer: Self-pay

## 2015-11-11 MED ORDER — METOPROLOL SUCCINATE ER 25 MG PO TB24: 37.5000 mg | ORAL_TABLET | Freq: Every day | ORAL | Status: DC

## 2015-11-11 NOTE — Telephone Encounter (Signed)
RX has been sent to the pharmacy for refill

## 2015-11-13 ENCOUNTER — Other Ambulatory Visit: Payer: Self-pay | Admitting: Internal Medicine

## 2015-11-13 MED ORDER — METOPROLOL SUCCINATE ER 25 MG PO TB24: 37.5000 mg | ORAL_TABLET | Freq: Every day | ORAL | Status: DC

## 2015-11-13 MED ORDER — METOPROLOL SUCCINATE ER 25 MG PO TB24
37.5000 mg | ORAL_TABLET | Freq: Every day | ORAL | Status: DC
Start: 2015-11-13 — End: 2015-11-15

## 2015-11-13 NOTE — Telephone Encounter (Signed)
°*  STAT* If patient is at the pharmacy, call can be transferred to refill team.   1. Which medications need to be refilled? (please list name of each medication and dose if known) Metoprolol '50mg'$   2. Which pharmacy/location (including street and city if local pharmacy) is medication to be sent to?[facility]   3. Do they need a 30 day or 90 day supply? He needs a 30 day from [employer] [facility] [employer], and 90 day from [employer]

## 2015-11-13 NOTE — Telephone Encounter (Signed)
Rx(s) sent to pharmacy electronically.  

## 2015-11-14 ENCOUNTER — Telehealth: Payer: Self-pay | Admitting: Internal Medicine

## 2015-11-14 NOTE — Telephone Encounter (Signed)
Returned [employer].  Dose and form clarification given for metoprolol succinate 37.'5mg'$  daily.

## 2015-11-15 ENCOUNTER — Other Ambulatory Visit: Payer: Self-pay | Admitting: *Deleted

## 2015-11-15 MED ORDER — METOPROLOL SUCCINATE ER 25 MG PO TB24: 37.5000 mg | ORAL_TABLET | Freq: Every day | ORAL | Status: DC

## 2015-12-12 ENCOUNTER — Encounter: Payer: Self-pay | Admitting: Nurse Practitioner

## 2015-12-12 ENCOUNTER — Ambulatory Visit (INDEPENDENT_AMBULATORY_CARE_PROVIDER_SITE_OTHER): Payer: Medicare Other | Admitting: Nurse Practitioner

## 2015-12-12 VITALS — BP 122/58 | HR 64 | Ht 69.0 in | Wt 267.8 lb

## 2015-12-12 DIAGNOSIS — Z8673 Personal history of transient ischemic attack (TIA), and cerebral infarction without residual deficits: Secondary | ICD-10-CM

## 2015-12-12 DIAGNOSIS — R531 Weakness: Secondary | ICD-10-CM | POA: Diagnosis not present

## 2015-12-12 DIAGNOSIS — R269 Unspecified abnormalities of gait and mobility: Secondary | ICD-10-CM | POA: Diagnosis not present

## 2015-12-12 DIAGNOSIS — R413 Other amnesia: Secondary | ICD-10-CM

## 2015-12-12 NOTE — Progress Notes (Signed)
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 At baseline he ambulates with a left ankle brace, still active at home, manage his own finances, driving without difficulty, was able to fix home electricity such as lawn mow without difficulty, over the past 6 months, he became forgetful, he tends to repeat himself, forgot name,   He had MRI at [facility], there is evidence of old left parietal subcortical stroke, mild small vessel disease,  echocardiogram July 2013 showed ejection fraction 55%,RVSP of 36, mild aortic valvular stenosis,  BUN and creatinine 23/1 point 38, LDL 45, triglyceride 104,  He has not had further stroke or TIA symtoms. He is currently on Plavix and aspirin with moderate bruising. He is also taking Aricept, tolerating medication well, he has no recurrent strokelike symptoms, he complains of bilateral hand, and feet paresthesia, previously tried and failed different neuropathic pain medications, this including Neurontin, Lyrica, Cymbalta, He was put on amitriptyline around March 2015, 50 mg twice a day, tolerating it well, it does help him moderately, but he still complains of dense numbness of his bilateral fingertips, and toes,  UPDATE 12/11/2014:YYHe is overall doing very well,  tolerating current medications, amitriptyline 50 mg every night has helped his sleep, he continued to ambulate with left ankle brace UPDATE 12/12/15 Randall Wilkins, 74 year old male returns for follow-up. Has a history of stroke found on MRI, evidence of left parietal subcortical stroke and mild small vessel disease in 2013. He is currently on Plavix and aspirin with moderate bruising. He has not had further stroke or TIA symptoms. He did have a heart attack with open-heart surgery in April 2016. His cardiac rehabilitation has completed. He also has a history of obstructive sleep apnea and claims he is compliant with CPAP. He has some mild memory loss and is currently taking Aricept without side effects. He also has insulin-dependent diabetic and has bilateral hand and feet paresthesias, currently controlled with amitriptyline . He uses a single-point cane to ambulate and denies any recent falls. He has a AFO to left lower leg. He returns for reevaluation   REVIEW OF SYSTEMS: Full 14 system review of systems performed and notable only for those listed, all others are neg:  Constitutional: neg  Cardiovascular: neg Ear/Nose/Throat: neg  Skin: neg Eyes: neg Respiratory: neg Gastroitestinal: Diarrhea Hematology/Lymphatic: Easy bruising Endocrine: Excessive thirst Musculoskeletal: Joint pain walking difficulty Allergy/Immunology: neg Neurological: Memory loss, numbness Psychiatric: neg Sleep : Obstructive sleep apnea with CPAP   ALLERGIES: Allergies  Allergen Reactions  . Actos [Pioglitazone] Swelling  . Metformin And Related Nausea Only  . Niaspan [Niacin Er] Itching and Rash    HOME MEDICATIONS: Outpatient Prescriptions Prior to Visit  Medication Sig Dispense Refill  . allopurinol (ZYLOPRIM) 100 MG tablet Take 300 mg by mouth daily.     Marland Kitchen amitriptyline (ELAVIL) 50 MG tablet Take 50 mg by mouth 2 (two) times daily.     Marland Kitchen  aspirin 81 MG tablet Take 1 tablet (81 mg total) by mouth daily.    .  clopidogrel (PLAVIX) 75 MG tablet Take 75 mg by mouth daily.    . colchicine 0.6 MG tablet Take 0.6 mg by mouth daily as needed.    . donepezil (ARICEPT) 10 MG tablet TAKE 1 BY MOUTH DAILY 90 tablet 0  . fish oil-omega-3 fatty acids 1000 MG capsule Take 1 g by mouth 2 (two) times daily.     Marland Kitchen HYDROcodone-acetaminophen (VICODIN) 5-500 MG per tablet Take 2 tablets by mouth at bedtime.     . Insulin Glargine 300 UNIT/ML SOPN Inject 60 Units into the skin every morning.    . insulin lispro (HUMALOG) 100 UNIT/ML injection Inject 20-25 Units into the skin 4 (four) times daily. Takes 25u in am, 20u at lunch, 20u in evening and 25u at bedtime    . lisinopril (PRINIVIL,ZESTRIL) 5 MG tablet Take 5 mg by mouth daily.     . metoprolol succinate (TOPROL-XL) 25 MG 24 hr tablet Take 1.5 tablets (37.5 mg total) by mouth daily. 90 tablet 3  . NON FORMULARY Inhale 1 application into the lungs at bedtime. CPAP    . pantoprazole (PROTONIX) 40 MG tablet Take 1 tablet (40 mg total) by mouth 2 (two) times daily. (Patient taking differently: Take 40 mg by mouth daily. ) 60 tablet 3  . potassium chloride SA (K-DUR,KLOR-CON) 20 MEQ tablet Take 20 mEq by mouth daily.     . rosuvastatin (CRESTOR) 20 MG tablet Take 20 mg by mouth daily.    . Tamsulosin HCl (FLOMAX) 0.4 MG CAPS Take 0.4 mg by mouth daily.     Marland Kitchen torsemide (DEMADEX) 10 MG tablet Take 50 mg by mouth daily.     No facility-administered medications prior to visit.    PAST MEDICAL HISTORY: Past Medical History  Diagnosis Date  . Rhabdomyolysis   . Memory loss   . Exogenous obesity   . Insulin dependent diabetes mellitus ([place])   . Peripheral neuropathy ([place])   . Venous insufficiency   . CAD (coronary artery disease)   . Hyperlipidemia   . Gout   . Stroke ([place])     L patietal with small scattered lacunar infarcts  . Left foot drop   . Hypertension   . History of nuclear stress test 07/2011    dipyridamole; fixed inferolateral defect, worse at stress  than rest; no reversible ischemia; low risk scan   . OSA on CPAP     uses a cpap  . Heart attack ([place])   . Aortic stenosis, mild 11/14/2013  . Bilateral leg edema 05/21/2014  . Diabetic peripheral neuropathy associated with type 1 diabetes mellitus ([place]) 11/14/2013  . Obesity (BMI 30-39.9) 11/14/2013  . Thrombocytopenia ([place]) 03/21/2015  . Obstructive sleep apnea 03/21/2015  . Dyspnea on exertion 03/21/2015  . Diabetes 1.5, managed as type 1 ([place]) 02/04/2013  . Weakness generalized 03/21/2015  . Chronic diastolic congestive heart failure ([place])   . Chronic kidney disease (CKD), stage III (moderate)   . Dysphagia   . Peptic ulcer with hemorrhage 03/28/2015  . Left main coronary artery disease 03/28/2015  . S/P CABG x 2 03/29/2015    LIMA to Diagonal, SVG to OM, EVH via right thigh  . Aortic valve sclerosis 03/29/2015    PAST SURGICAL HISTORY: Past Surgical History  Procedure Laterality Date  . Cholecystectomy    . Sinus endo w/fusion    . Coronary angioplasty  10/13/1996  .  Rotator cuff repair Right   . Transthoracic echocardiogram  08/08/2013    EF 55-60%, mild conc hypertrophy, grade 1 diastolic dysfunction; AV with mild stenosis; LA & RA mildly dilated  . Coronary angioplasty  09/21/1989    emergency PTCA  . Coronary angioplasty  10/13/1996    Multi-Link diagonal & OD stenting (Dr. Marella Chimes)  . Coronary angioplasty  12/03/1997    disease of mid DX-1 ~50% & in mid PLA & PDA (distal lesions) (Dr. Marella Chimes)   . Coronary angioplasty  10/14/1999    progression of disease distal PLA & PDA; progression of disease prox RCA - moderate (Dr. Marella Chimes)   . Coronary angioplasty with stent placement  04/04/2004    4.0x53m non-DES (thrombectomy via AngioJet) to RCA for high grade stenosis (Dr. RMarella Chimes  . Carotid doppler  03/2013    bilat bulb/prox ICAs - mild amount of fibrous plaque with no evidence of diameter reduction  . Back surgery  2002    lumbosacral  . Tonsillectomy    .  Colonoscopy    . Carpal tunnel release Bilateral 08/09/2014    Procedure: BILATERAL CARPAL TUNNEL RELEASE;  Surgeon: GDaryll Brod MD;  [facility]  Service: Orthopedics;  Laterality: Bilateral;  ANESTHESIA:  IV REGIONAL BIL FAB  . Esophagogastroduodenoscopy N/A 03/27/2015    Procedure: ESOPHAGOGASTRODUODENOSCOPY (EGD);  Surgeon: MClarene Essex MD;  [facility];  Service: Endoscopy;  Laterality: N/A;  possible dilation  . Left heart catheterization with coronary angiogram N/A 03/28/2015    Procedure: LEFT HEART CATHETERIZATION WITH CORONARY ANGIOGRAM;  Surgeon: Peter M JMartinique MD;  [facility];  Service: Cardiovascular;  Laterality: N/A;  . Coronary artery bypass graft N/A 03/29/2015    Procedure: CORONARY ARTERY BYPASS GRAFTING TIMES TWO USING LEFT INTERNAL MAMMARY ARTERY AND RIGHT LEG GREATER SAPHENOUS VEIN HARVESTED ENDOSCOPICALLY.;  Surgeon: CRexene Alberts MD;  [facility]  Service: Open Heart Surgery;  Laterality: N/A;    FAMILY HISTORY: Family History  Problem Relation Age of Onset  . Coronary artery disease Father   . Heart disease Mother   . Cancer Maternal Grandmother   . Heart Problems Maternal Grandfather     SOCIAL HISTORY: Social History   Social History  . Marital Status: Married    Spouse Name: N/A  . Number of Children: 3  . Years of Education: 12   Occupational History  . Not on file.   Social History Main Topics  . Smoking status: Former Smoker -- 2.00 packs/day for 20 years    Types: Cigarettes    Quit date: 01/25/1973  . Smokeless tobacco: Never Used  . Alcohol Use: No  . Drug Use: No  . Sexual Activity: Not on file   Other Topics Concern  . Not on file   Social History Narrative   Lives with     PHYSICAL EXAM  Filed Vitals:   12/12/15 0906  BP: 122/58  Pulse: 64  Height: '5\' 9"'$  (1.753 m)  Weight: 267 lb 12.8 oz (121.473 kg)   Body mass index is 39.53 kg/(m^2). Generalized: Well developed, obese  male in no acute distress  Head: normocephalic and atraumatic,. Oropharynx benign  Neck: Supple, no carotid bruits  Cardiac: Regular rate rhythm, no murmur  Musculoskeletal: Left foot drop AFO Neurological examination   Mentation: Alert oriented to time, place, history taking.MMSE 27/30 missing items in orientation and 2 of 3 recall. AFT 10. Clock drawing 4/4. MMSE 28/30 Cranial nerve II-XII:  Pupils were equal round reactive to light extraocular movements were full, visual field were full on confrontational test. Facial sensation and strength were normal. Hearing was intact to finger rubbing bilaterally. Uvula tongue midline. head turning and shoulder shrug were normal and symmetric.Tongue protrusion into cheek strength was normal. Motor: normal bulk and tone, full strength in the BUE, BLE, on the right , left lower extremity weakness with AFO in place and footdrop  Sensory: Decreased pinprick and vibratory at both legs to midshin level, decreased pinprick at finger tips Coordination: finger-nose-finger, heel-to-shin normal on the right, unable to perform on the left  Reflexes: 1+ and symmetric except absent Achilles plantar responses were flexor bilaterally. Gait and Station: left foot drop with left AFO, cautious, mildly unsteady gait with single-point cane  DIAGNOSTIC DATA (LABS, IMAGING, TESTING) - I reviewed patient records, labs, notes, testing and imaging myself where available.  Lab Results  Component Value Date   WBC 13.0* 04/05/2015   HGB 10.7* 04/05/2015   HCT 33.3* 04/05/2015   MCV 90.2 04/05/2015   PLT 280 04/05/2015      Component Value Date/Time   NA 140 04/05/2015 1209   K 4.1 04/05/2015 1209   CL 99 04/05/2015 1209   CO2 27 04/05/2015 1209   GLUCOSE 145* 04/05/2015 1209   BUN 28* 04/05/2015 1209   CREATININE 2.01* 04/05/2015 1209   CALCIUM 8.7 04/05/2015 1209   PROT 5.3* 03/29/2015 0054   ALBUMIN 2.4* 03/29/2015 0054   AST 29 03/29/2015 0054   ALT 29  03/29/2015 0054   ALKPHOS 62 03/29/2015 0054   BILITOT 0.7 03/29/2015 0054   GFRNONAA 31* 04/05/2015 1209   GFRAA 36* 04/05/2015 1209    Lab Results  Component Value Date   HGBA1C 7.7* 03/29/2015     ASSESSMENT AND PLAN  74 y.o. year old male has a past medical history of rhabdomyolysis, with persistent left foot drop, wear left AFO, baseline gait difficulty,; left parietal subcortical stroke, small vessel disease and Memory loss.He has bilateral hands and feet neuropathic pain, numbness, which has improved with amitriptyline 50 mg twice a day.  Continue Aricept at current dose Continue Plavix and ASA for secondary stroke prevention Continue Amitriptyline twice daily Memory score is stableVst time 25 min Dennie Bible, [facility], APRN  [facility] [address] [place], [place] [address] [phone]

## 2015-12-12 NOTE — Progress Notes (Signed)
I have reviewed and agreed above plan. 

## 2015-12-12 NOTE — Patient Instructions (Signed)
Continue Aricept at current dose Continue Plavix and ASA for secondary stroke prevention Continue Amitriptyline twice daily Memory score is stable

## 2016-01-16 DIAGNOSIS — E789 Disorder of lipoprotein metabolism, unspecified: Secondary | ICD-10-CM | POA: Diagnosis not present

## 2016-01-16 DIAGNOSIS — E118 Type 2 diabetes mellitus with unspecified complications: Secondary | ICD-10-CM | POA: Diagnosis not present

## 2016-01-20 ENCOUNTER — Telehealth: Payer: Self-pay | Admitting: Nurse Practitioner

## 2016-01-20 MED ORDER — DONEPEZIL HCL 10 MG PO TABS: ORAL_TABLET | ORAL | Status: DC

## 2016-01-20 NOTE — Telephone Encounter (Signed)
Patient called to request a 90 day supply of donepezil (ARICEPT) 10 MG tablet for 1 year be faxed to [facility] Fax# [phone].

## 2016-01-23 DIAGNOSIS — E039 Hypothyroidism, unspecified: Secondary | ICD-10-CM | POA: Diagnosis not present

## 2016-01-23 DIAGNOSIS — I2510 Atherosclerotic heart disease of native coronary artery without angina pectoris: Secondary | ICD-10-CM | POA: Diagnosis not present

## 2016-01-23 DIAGNOSIS — E118 Type 2 diabetes mellitus with unspecified complications: Secondary | ICD-10-CM | POA: Diagnosis not present

## 2016-01-23 DIAGNOSIS — E032 Hypothyroidism due to medicaments and other exogenous substances: Secondary | ICD-10-CM | POA: Diagnosis not present

## 2016-01-31 DIAGNOSIS — E039 Hypothyroidism, unspecified: Secondary | ICD-10-CM | POA: Diagnosis not present

## 2016-02-18 DIAGNOSIS — A09 Infectious gastroenteritis and colitis, unspecified: Secondary | ICD-10-CM | POA: Diagnosis not present

## 2016-02-25 DIAGNOSIS — G4733 Obstructive sleep apnea (adult) (pediatric): Secondary | ICD-10-CM | POA: Diagnosis not present

## 2016-02-26 ENCOUNTER — Other Ambulatory Visit: Payer: Self-pay | Admitting: Rheumatology

## 2016-02-26 DIAGNOSIS — M545 Low back pain: Secondary | ICD-10-CM | POA: Diagnosis not present

## 2016-02-26 DIAGNOSIS — G8929 Other chronic pain: Secondary | ICD-10-CM

## 2016-02-26 DIAGNOSIS — M109 Gout, unspecified: Secondary | ICD-10-CM | POA: Diagnosis not present

## 2016-02-26 DIAGNOSIS — M5416 Radiculopathy, lumbar region: Secondary | ICD-10-CM | POA: Diagnosis not present

## 2016-02-26 DIAGNOSIS — N189 Chronic kidney disease, unspecified: Secondary | ICD-10-CM | POA: Diagnosis not present

## 2016-02-27 ENCOUNTER — Telehealth: Payer: Self-pay | Admitting: Internal Medicine

## 2016-02-27 NOTE — Telephone Encounter (Signed)
1. Type of surgery: lumbar epidural steroid injection  2. Date of surgery: TBD upon clearance  3. Surgeon: Madison - no MD specified 4. Medications that need to be held & how long: plavix - 5 days prior (also on aspirin) 5. Fax and/or Phone: (p) [phone]  (f) [phone]  Routed to MD

## 2016-03-02 NOTE — Telephone Encounter (Signed)
Clearance routed via EPIC 

## 2016-03-02 NOTE — Telephone Encounter (Signed)
Patient notified

## 2016-03-02 NOTE — Telephone Encounter (Signed)
Low risk for injection - ok to hold plavix x 5 days and restart after injection.  Dr. Lemmie Evens

## 2016-03-04 ENCOUNTER — Encounter: Payer: Self-pay | Admitting: Internal Medicine

## 2016-03-04 ENCOUNTER — Ambulatory Visit (INDEPENDENT_AMBULATORY_CARE_PROVIDER_SITE_OTHER): Payer: Medicare Other | Admitting: Internal Medicine

## 2016-03-04 VITALS — BP 114/52 | HR 58 | Ht 69.0 in | Wt 264.2 lb

## 2016-03-04 DIAGNOSIS — Z9861 Coronary angioplasty status: Secondary | ICD-10-CM

## 2016-03-04 DIAGNOSIS — Z951 Presence of aortocoronary bypass graft: Secondary | ICD-10-CM

## 2016-03-04 DIAGNOSIS — E785 Hyperlipidemia, unspecified: Secondary | ICD-10-CM | POA: Diagnosis not present

## 2016-03-04 DIAGNOSIS — I2510 Atherosclerotic heart disease of native coronary artery without angina pectoris: Secondary | ICD-10-CM

## 2016-03-04 DIAGNOSIS — I358 Other nonrheumatic aortic valve disorders: Secondary | ICD-10-CM

## 2016-03-04 NOTE — Patient Instructions (Signed)
Your physician wants you to follow-up in: 1 year with Dr. Hilty. You will receive a reminder letter in the mail two months in advance. If you don't receive a letter, please call our office to schedule the follow-up appointment.  

## 2016-03-04 NOTE — Progress Notes (Signed)
OFFICE NOTE  Chief Complaint:  Routine follow-up, no complaints  Primary Care Physician: Randall Huh, MD  HPI:  Randall Wilkins is a pleasant 75 year old male patient who was formerly followed by Randall Wilkins. He is here to establish cardiac care with [place] today. He has a history of obesity, multiple comorbidities including insulin-dependent diabetes, diabetic peripheral neuropathy, diabetic nephropathy, venous insufficiency and peripheral neuropathy. He also has obstructive sleep apnea on CPAP and coronary artery disease. He unfortunately suffered a left parietal stroke with small lacunar infarcts noted on MRI in 2013. He is a left foot drop from an L4/L5 neuropathy and wears a brace for this. From a cardiac standpoint he had a large non-drug-eluting stent placed in the RCA in 2005. He has had a stent placed at 97 to a diagonal which was noted to be patent. He's had some problems with dizziness which is improved. He [place] as large to medium secondary to neuropathy and venous insufficiency. Unfortunately not been able to lose a lot of weight due to his difficulty in ambulating and painful neuropathy.  He denies any cardiac chest pain or worsening shortness of breath.  [name] for a six-month followup. He is having no complaints. He does report some lower extremity swelling which is stable. He has mild aortic stenosis which is been stable as well. His last echo was in August 2014. He denies any chest pain or worsening shortness of breath. He recently had lab work to Randall Wilkins office which we will obtain.  I saw [name] today for hospital follow-up. He is reportedly doing much better. He has more energy and no significant chest discomfort. Between our last appointment see presented with a urinary tract infection and ultimately had chest pain and profuse diaphoresis. He was found to have elevated troponin and underwent cardiac catheterization. This demonstrated the  following:  Coronary angiography: Coronary dominance: right  Left mainstem: 95% distal left main stenosis.  Left anterior descending (LAD): There is 95% ostial LAD stenosis. The LAD is a large vessel. The LAD and diagonal otherwise are without significant disease.  There is a large ramus intermediate branch with 99% ostial stenosis.  Left circumflex (LCx): 100% ostial occlusion with faint left to left collaterals.  Right coronary artery (RCA): the RCA is a large dominant branch. The stent in the proximal vessel is widely patent. There is a 60% stenosis in the mid PDA. The third PLOM is moderate in size with a long 90-95% stenosis prior to distal arborization.   Subsequently he underwent emergent coronary artery bypass grafting with a LIMA to LAD and SVG to OM branch. He is noted to have mild aortic sclerosis versus stenosis, however the valve was not severe enough to be addressed. Since discharge he is more active and is starting cardiac rehabilitation today.  [name] for follow-up. Overall he is feeling well and continues to improve in his ambulation and stamina. He denies any chest pain or shortness of breath. He is rehabilitating well. Recent laboratory work shows excellent cholesterol control and total cholesterol 130, HDL 42 LDL 61 and triglycerides 137. His only concern is the cost of medications.  I saw [name] today in the office. He denies any chest pain or worsening shortness of breath. Unfortunately he's had about several weeks of diarrhea which started when he went to the [facility] area. It was not felt to be infectious any seeing a gastroenterologist about this. Otherwise he is  without cardiac complaints. Blood pressure today was low at 114/52. He's managed to lose only a couple of pounds but generally his appetite stools been pretty good despite the diarrhea. He is planning on having an upcoming injection and I received a request to hold his Plavix for 5  days prior to that. He is currently holding it will have his injection on Monday. Based on this I reviewed his records and I do not see a clear ongoing indication for the Plavix in addition to aspirin. Now that he's been surgically revascularized.   PMHx:  Past Medical History  Diagnosis Date  . Rhabdomyolysis   . Memory loss   . Exogenous obesity   . Insulin dependent diabetes mellitus ([place])   . Peripheral neuropathy ([place])   . Venous insufficiency   . CAD (coronary artery disease)   . Hyperlipidemia   . Gout   . Stroke ([place])     L patietal with small scattered lacunar infarcts  . Left foot drop   . Hypertension   . History of nuclear stress test 07/2011    dipyridamole; fixed inferolateral defect, worse at stress than rest; no reversible ischemia; low risk scan   . OSA on CPAP     uses a cpap  . Heart attack ([place])   . Aortic stenosis, mild 11/14/2013  . Bilateral leg edema 05/21/2014  . Diabetic peripheral neuropathy associated with type 1 diabetes mellitus ([place]) 11/14/2013  . Obesity (BMI 30-39.9) 11/14/2013  . Thrombocytopenia ([place]) 03/21/2015  . Obstructive sleep apnea 03/21/2015  . Dyspnea on exertion 03/21/2015  . Diabetes 1.5, managed as type 1 ([place]) 02/04/2013  . Weakness generalized 03/21/2015  . Chronic diastolic congestive heart failure ([place])   . Chronic kidney disease (CKD), stage III (moderate)   . Dysphagia   . Peptic ulcer with hemorrhage 03/28/2015  . Left main coronary artery disease 03/28/2015  . S/P CABG x 2 03/29/2015    LIMA to Diagonal, SVG to OM, EVH via right thigh  . Aortic valve sclerosis 03/29/2015    Past Surgical History  Procedure Laterality Date  . Cholecystectomy    . Sinus endo w/fusion    . Coronary angioplasty  10/13/1996  . Rotator cuff repair Right   . Transthoracic echocardiogram  08/08/2013    EF 55-60%, mild conc hypertrophy, grade 1 diastolic dysfunction; AV with mild stenosis; LA & RA mildly dilated  . Coronary angioplasty  09/21/1989     emergency PTCA  . Coronary angioplasty  10/13/1996    Multi-Link diagonal & OD stenting (Dr. Marella Wilkins)  . Coronary angioplasty  12/03/1997    disease of mid DX-1 ~50% & in mid PLA & PDA (distal lesions) (Dr. Marella Wilkins)   . Coronary angioplasty  10/14/1999    progression of disease distal PLA & PDA; progression of disease prox RCA - moderate (Dr. Marella Wilkins)   . Coronary angioplasty with stent placement  04/04/2004    4.0x58m non-DES (thrombectomy via AngioJet) to RCA for high grade stenosis (Dr. RMarella Wilkins  . Carotid doppler  03/2013    bilat bulb/prox ICAs - mild amount of fibrous plaque with no evidence of diameter reduction  . Back surgery  2002    lumbosacral  . Tonsillectomy    . Colonoscopy    . Carpal tunnel release Bilateral 08/09/2014    Procedure: BILATERAL CARPAL TUNNEL RELEASE;  Surgeon: GDaryll Brod MD;  Location: [facility]  Service: Orthopedics;  Laterality: Bilateral;  ANESTHESIA:  IV REGIONAL  BIL FAB  . Esophagogastroduodenoscopy N/A 03/27/2015    Procedure: ESOPHAGOGASTRODUODENOSCOPY (EGD);  Surgeon: Clarene Essex, MD;  Location: [facility] ENDOSCOPY;  Service: Endoscopy;  Laterality: N/A;  possible dilation  . Left heart catheterization with coronary angiogram N/A 03/28/2015    Procedure: LEFT HEART CATHETERIZATION WITH CORONARY ANGIOGRAM;  Surgeon: Peter M Martinique, MD;  Location: [facility] CATH LAB;  Service: Cardiovascular;  Laterality: N/A;  . Coronary artery bypass graft N/A 03/29/2015    Procedure: CORONARY ARTERY BYPASS GRAFTING TIMES TWO USING LEFT INTERNAL MAMMARY ARTERY AND RIGHT LEG GREATER SAPHENOUS VEIN HARVESTED ENDOSCOPICALLY.;  Surgeon: Rexene Alberts, MD;  Location: [facility];  Service: Open Heart Surgery;  Laterality: N/A;    FAMHx:  Family History  Problem Relation Age of Onset  . Coronary artery disease Father   . Heart disease Mother   . Cancer Maternal Grandmother   . Heart Problems Maternal Grandfather     SOCHx:   reports that he quit  smoking about 43 years ago. His smoking use included Cigarettes. He has a 40 pack-year smoking history. He has never used smokeless tobacco. He reports that he does not drink alcohol or use illicit drugs.  ALLERGIES:  Allergies  Allergen Reactions  . Actos [Pioglitazone] Swelling  . Metformin And Related Nausea Only  . Niaspan [Niacin Er] Itching and Rash    ROS: A comprehensive review of systems was negative.  HOME MEDS: Current Outpatient Prescriptions  Medication Sig Dispense Refill  . allopurinol (ZYLOPRIM) 300 MG tablet Take 1 tablet by mouth daily. Take 1 tab by my mouth daily  2  . amitriptyline (ELAVIL) 50 MG tablet Take 50 mg by mouth 2 (two) times daily.     Marland Kitchen aspirin 81 MG tablet Take 1 tablet (81 mg total) by mouth daily.    . clopidogrel (PLAVIX) 75 MG tablet Take 75 mg by mouth daily.    . colchicine 0.6 MG tablet Take 0.6 mg by mouth daily as needed.    . colestipol (COLESTID) 1 g tablet Take by mouth. Take 2 tabs in the morning and 2.5 tabs in the eveninh    . donepezil (ARICEPT) 10 MG tablet TAKE 1 TABLET BY MOUTH DAILY 90 tablet 2  . fish oil-omega-3 fatty acids 1000 MG capsule Take 1 g by mouth 2 (two) times daily.     Marland Kitchen HYDROcodone-acetaminophen (VICODIN) 5-500 MG per tablet Take 2 tablets by mouth at bedtime.     . Insulin Glargine 300 UNIT/ML SOPN Inject 60 Units into the skin every morning.    . insulin lispro (HUMALOG) 100 UNIT/ML injection Inject 20-25 Units into the skin 4 (four) times daily. Takes 25u in am, 20u at lunch, 20u in evening and 25u at bedtime    . LANTUS SOLOSTAR 100 UNIT/ML Solostar Pen   2  . levothyroxine (SYNTHROID, LEVOTHROID) 25 MCG tablet Take 25 mcg by mouth daily.   0  . lisinopril (PRINIVIL,ZESTRIL) 5 MG tablet Take 5 mg by mouth daily.     . metoprolol succinate (TOPROL-XL) 25 MG 24 hr tablet Take 1.5 tablets (37.5 mg total) by mouth daily. 90 tablet 3  . NON FORMULARY Inhale 1 application into the lungs at bedtime. CPAP    .  pantoprazole (PROTONIX) 40 MG tablet Take 40 mg by mouth daily.    . potassium chloride SA (K-DUR,KLOR-CON) 20 MEQ tablet Take 20 mEq by mouth daily.     . rosuvastatin (CRESTOR) 20 MG tablet Take 20 mg by mouth daily.    Marland Kitchen  Tamsulosin HCl (FLOMAX) 0.4 MG CAPS Take 0.4 mg by mouth daily.     Marland Kitchen torsemide (DEMADEX) 10 MG tablet Take 50 mg by mouth daily.     No current facility-administered medications for this visit.    LABS/IMAGING: No results found for this or any previous visit (from the past 48 hour(s)). No results found.  VITALS: BP 114/52 mmHg  Pulse 58  Ht '5\' 9"'$  (1.753 m)  Wt 264 lb 4 oz (119.863 kg)  BMI 39.01 kg/m2  EXAM: General appearance: alert and no distress Neck: no carotid bruit and no JVD Lungs: clear to auscultation bilaterally Heart: regular rate and rhythm, S1, S2 normal, systolic murmur: 3/6 early to mid-peaking systolic murmur 3/6, crescendo at 2nd right intercostal space and occasional missed beats Abdomen: obese, soft, non-tender, +BS Extremities: edema trace Pulses: 2+ and symmetric Skin: Skin color, texture, turgor normal. No rashes or lesions Neurologic: Mental status: Alert, oriented, thought content appropriate Psych: Pleasant mood, affect  EKG: Sinus bradycardia at 58 with first-degree AV block, nonspecific T-wave changes  ASSESSMENT: 1. Coronary artery disease status post BMS to the RCA in 2005, recent CABG 2 (LIMA to LAD - for critical left main disease, SVG to OM1)  2. Obesity 3. Hypertension-controlled 4. Dyslipidemia 5. Prior stroke 6. Diabetic neuropathy 7. Diabetic nephropathy - left foot drop 8. Type 1 diabetes on insulin 9. LE edema 10. Mild aortic stenosis  PLAN: 1.   [name] is doing fairly well from a cardiac standpoint. He denies any chest pain or worsening shortness of breath. I'm okay with him holding his Plavix for his upcoming injection, but actually in reviewing his chart I'm not sure if he continues to need to be on  it. There is some data for at least a year or longer after cardiovascular revascularization by bypass, but at this point I'm not sure that there is ongoing indication for Plavix. I would pose a question to Dr. Roxy Manns who is seeing next month. If he agrees we may discontinue his Plavix. Otherwise remain on aspirin and I've encouraged him to continue to work on weight loss and glycemic control. Hemoglobin A1c was 7.7 recently in February which is up from 7.2. Cholesterol seems to be well-controlled with total cholesterol 130 and LDL 62.  Follow-up annually.   Pixie Casino, MD, [facility] Attending Cardiologist  Adams C Olajuwon Fosdick 03/04/2016, 1:20 PM

## 2016-03-09 ENCOUNTER — Ambulatory Visit
Admission: RE | Admit: 2016-03-09 | Discharge: 2016-03-09 | Disposition: A | Discharge status: Home / Self Care | Payer: Medicare Other | Source: Ambulatory Visit | Attending: Rheumatology | Admitting: Rheumatology

## 2016-03-09 DIAGNOSIS — G8929 Other chronic pain: Secondary | ICD-10-CM

## 2016-03-09 DIAGNOSIS — M545 Low back pain: Principal | ICD-10-CM

## 2016-03-09 MED ORDER — IOHEXOL 180 MG/ML  SOLN
1.0000 mL | Freq: Once | INTRAMUSCULAR | Status: AC | PRN
  Administered 2016-03-09: 1 mL via EPIDURAL

## 2016-03-09 MED ORDER — METHYLPREDNISOLONE ACETATE 40 MG/ML INJ SUSP (RADIOLOG
120.0000 mg | Freq: Once | INTRAMUSCULAR | Status: AC
  Administered 2016-03-09: 120 mg via EPIDURAL

## 2016-03-09 NOTE — Discharge Instructions (Signed)

## 2016-03-25 ENCOUNTER — Ambulatory Visit
Admission: RE | Admit: 2016-03-25 | Discharge: 2016-03-25 | Disposition: A | Discharge status: Home / Self Care | Payer: Medicare Other | Source: Ambulatory Visit | Attending: Family Medicine | Admitting: Family Medicine

## 2016-03-25 ENCOUNTER — Other Ambulatory Visit: Payer: Self-pay | Admitting: Family Medicine

## 2016-03-25 DIAGNOSIS — R0781 Pleurodynia: Secondary | ICD-10-CM

## 2016-03-25 DIAGNOSIS — R109 Unspecified abdominal pain: Secondary | ICD-10-CM | POA: Diagnosis not present

## 2016-03-30 ENCOUNTER — Ambulatory Visit: Payer: Medicare Other | Admitting: Thoracic Surgery (Cardiothoracic Vascular Surgery)

## 2016-03-30 ENCOUNTER — Ambulatory Visit (INDEPENDENT_AMBULATORY_CARE_PROVIDER_SITE_OTHER): Payer: Medicare Other | Admitting: Physician Assistant

## 2016-03-30 VITALS — BP 134/62 | HR 69 | Resp 20 | Wt 258.0 lb

## 2016-03-30 DIAGNOSIS — Z951 Presence of aortocoronary bypass graft: Secondary | ICD-10-CM | POA: Diagnosis not present

## 2016-03-30 DIAGNOSIS — I2510 Atherosclerotic heart disease of native coronary artery without angina pectoris: Secondary | ICD-10-CM

## 2016-03-30 NOTE — Progress Notes (Signed)
HPI:  Patient returns for 1 year post operative follow up.  He is S/P Emergent CABG x 2 performed in April of last year.  The patient states he is doing very well.  He states he feels better than he did prior to surgery.  He continues to be limited with mobility due to diabetic neuropathy.  He also has been having some rib pain which was much improved after a steroid injection for a bulging disc in his back.  He continues to have routine follow up by Dr. Debara Pickett.  Most recent follow up there was question if the patient could stop taking his Plavix.  He does not exercise on regular basis, but has been able to lose 30 lbs.   Current Outpatient Prescriptions  Medication Sig Dispense Refill  . allopurinol (ZYLOPRIM) 300 MG tablet Take 1 tablet by mouth daily. Take 1 tab by my mouth daily  2  . amitriptyline (ELAVIL) 50 MG tablet Take 50 mg by mouth 2 (two) times daily.     Marland Kitchen aspirin 81 MG tablet Take 1 tablet (81 mg total) by mouth daily.    . clopidogrel (PLAVIX) 75 MG tablet Take 75 mg by mouth daily.    . colchicine 0.6 MG tablet Take 0.6 mg by mouth daily as needed.    . colestipol (COLESTID) 1 g tablet Take by mouth. Take 2 tabs in the morning and 2.5 tabs in the eveninh    . donepezil (ARICEPT) 10 MG tablet TAKE 1 TABLET BY MOUTH DAILY 90 tablet 2  . fish oil-omega-3 fatty acids 1000 MG capsule Take 1 g by mouth 2 (two) times daily.     Marland Kitchen HYDROcodone-acetaminophen (VICODIN) 5-500 MG per tablet Take 2 tablets by mouth at bedtime.     . Insulin Glargine 300 UNIT/ML SOPN Inject 60 Units into the skin every morning.    . insulin lispro (HUMALOG) 100 UNIT/ML injection Inject 20-25 Units into the skin 4 (four) times daily. Takes 25u in am, 20u at lunch, 20u in evening and 25u at bedtime    . LANTUS SOLOSTAR 100 UNIT/ML Solostar Pen   2  . levothyroxine (SYNTHROID, LEVOTHROID) 25 MCG tablet Take 25 mcg by mouth daily.   0  . lisinopril (PRINIVIL,ZESTRIL) 5 MG tablet Take 5 mg by mouth daily.     .  metoprolol succinate (TOPROL-XL) 25 MG 24 hr tablet Take 1.5 tablets (37.5 mg total) by mouth daily. 90 tablet 3  . NON FORMULARY Inhale 1 application into the lungs at bedtime. CPAP    . pantoprazole (PROTONIX) 40 MG tablet Take 40 mg by mouth daily.    . potassium chloride SA (K-DUR,KLOR-CON) 20 MEQ tablet Take 20 mEq by mouth daily.     . rosuvastatin (CRESTOR) 20 MG tablet Take 20 mg by mouth daily.    . Tamsulosin HCl (FLOMAX) 0.4 MG CAPS Take 0.4 mg by mouth daily.     Marland Kitchen torsemide (DEMADEX) 10 MG tablet Take 50 mg by mouth daily.     No current facility-administered medications for this visit.    Physical Exam:  BP 134/62 mmHg  Pulse 69  Resp 20  Wt 258 lb (117.028 kg)  SpO2 96%  Gen: alert, cooperative Heart: RRR Lungs: CTA bilaterally Abd: soft non-tender, non- distended Ext: no edema appreciated Sternum: incision well healed, no instability noted   A/P  1. S/P CABG x 2- performed 03/2015, patient is doing great.  Continues routine follow up with Cardiology.  We are okay  with cessation of Plavix 2. DM, complicated by Neuropathy- encouraged patient to try water based exercise which may be tolerated despite leg pain 3. HTN 4. Dispo- patient doing great 1 yr post op from emergent CABG, continue follow up with Cardiology.... RTC prn   Ellwood Handler, PA-C [facility] [phone]

## 2016-03-31 IMAGING — CR DG CHEST 1V PORT
2 series · 2 of 2 positions shown · non-contrast
Comparison: Radiographs 03/24/2015 and 03/23/2015.

CLINICAL DATA: Chest pain.  Cardiac catheterization today.

EXAM:
PORTABLE CHEST - 1 VIEW

[AP (1 of 2)]
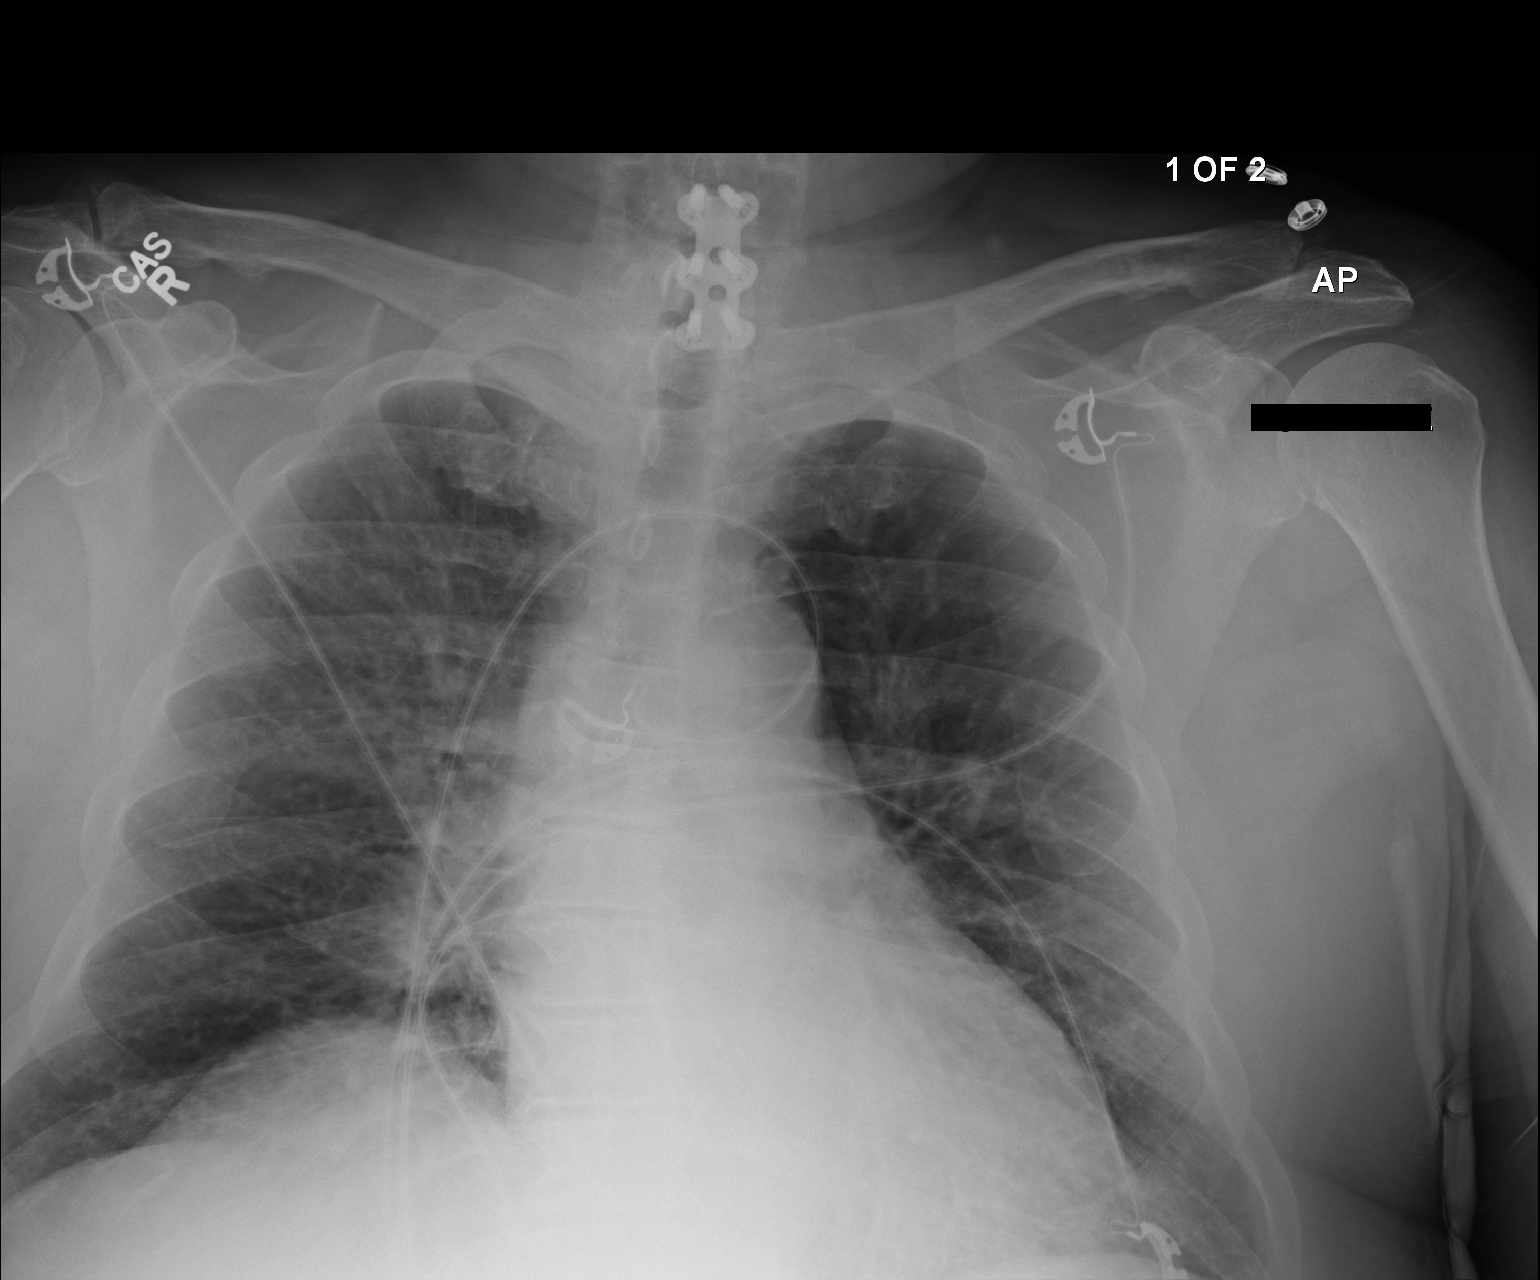

[AP (2 of 2)]
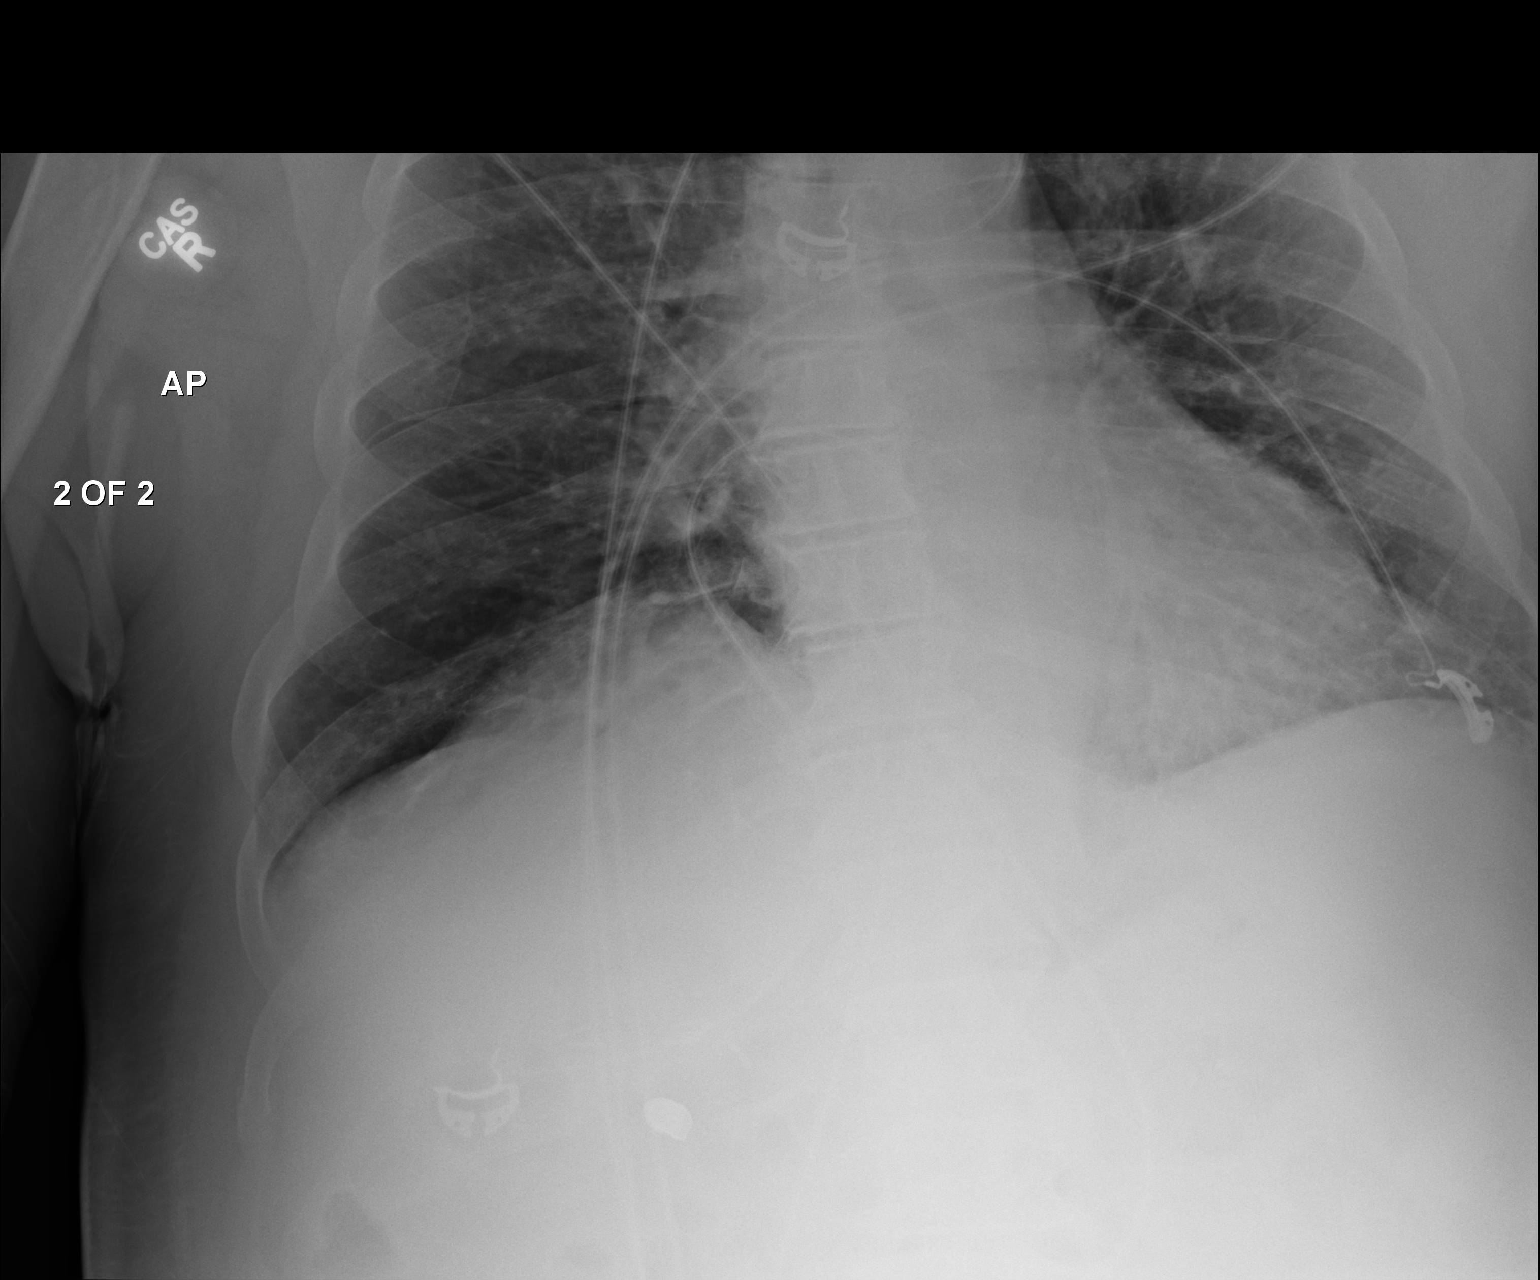

[2 of 2 positions shown; findings below may reference images not displayed]

FINDINGS: 3947 hours. Two views obtained. The heart size and mediastinal
contours are stable. There is mild vascular congestion with
near-complete interval clearing of the previously demonstrated right
perihilar airspace disease. There is no edema or significant pleural
effusion. There is no pneumothorax. Postsurgical changes in the
lower cervical spine status post multilevel fusion noted.
IMPRESSION: Resolving right perihilar airspace disease. No acute findings
demonstrated.

## 2016-04-01 ENCOUNTER — Telehealth: Payer: Self-pay | Admitting: Internal Medicine

## 2016-04-01 IMAGING — CR DG CHEST 1V PORT
1 series · 1 of 1 positions shown · non-contrast
Comparison: Preoperative radiographs obtained yesterday.

CLINICAL DATA: Postop CABG.

EXAM:
PORTABLE CHEST - 1 VIEW

[AP]
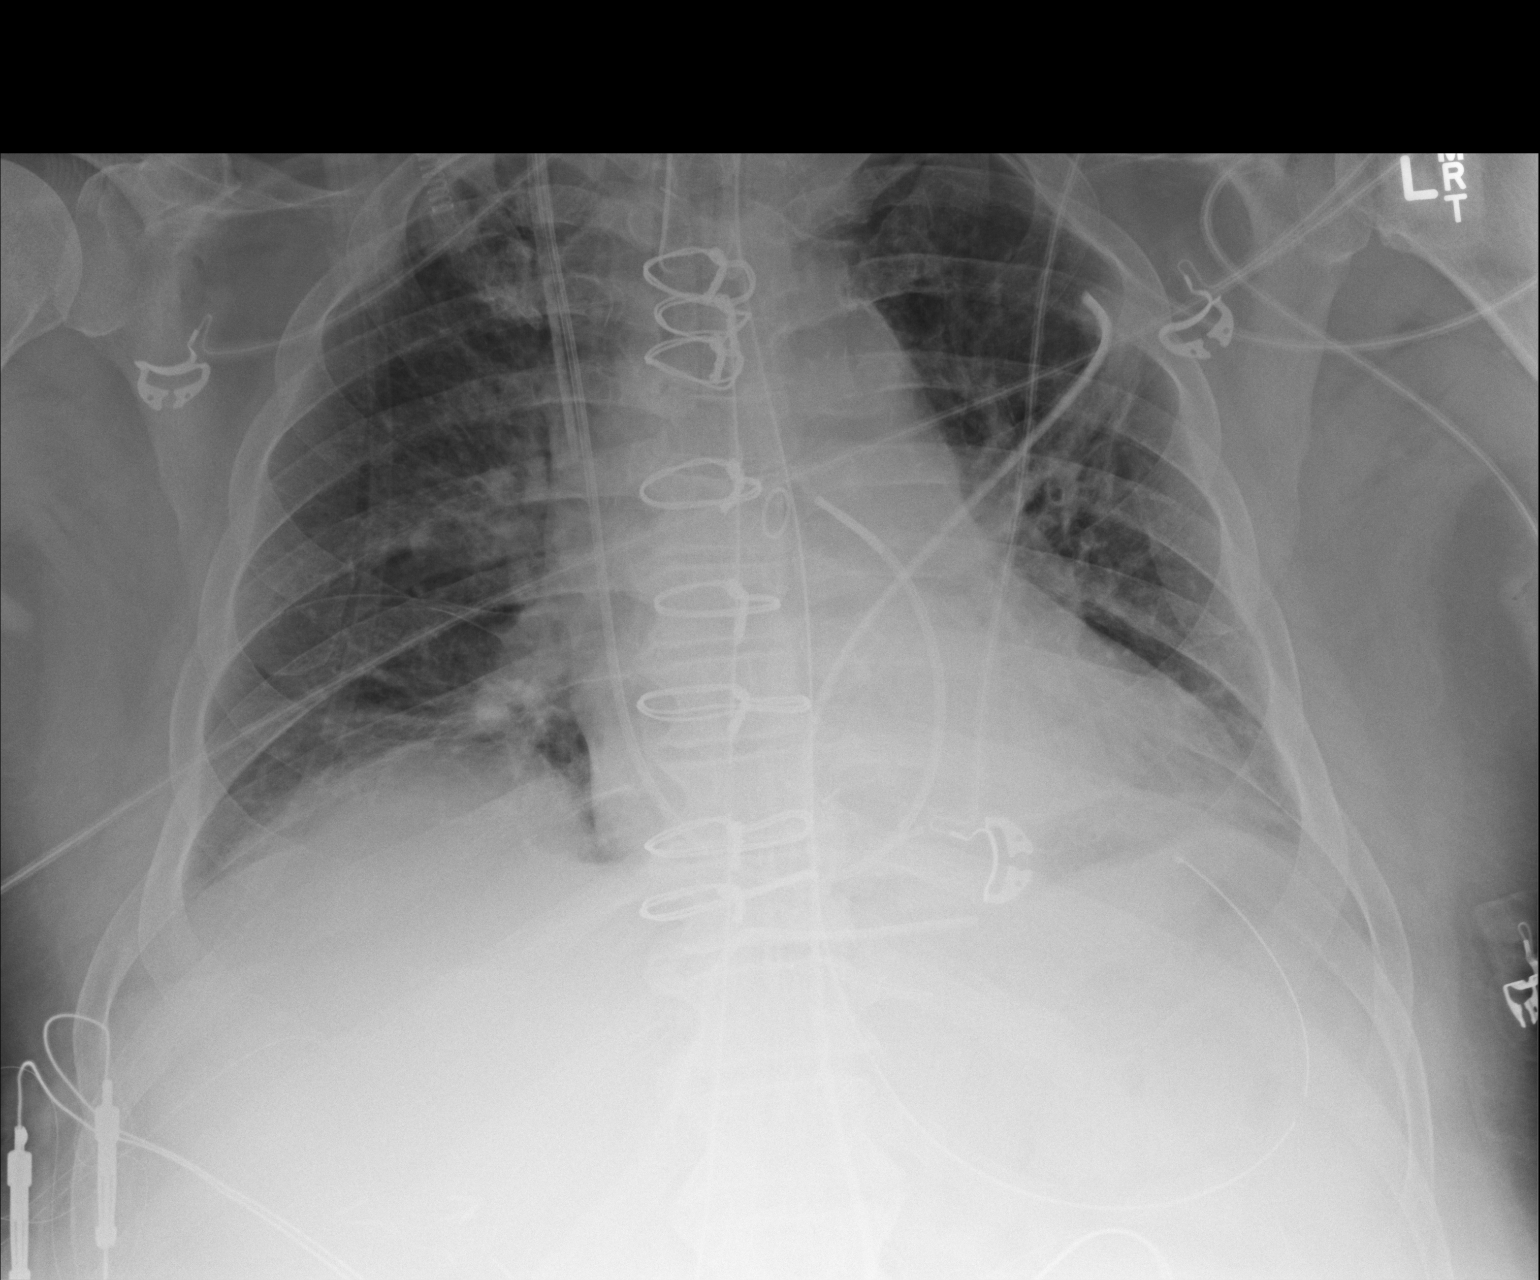

[1 of 1 positions shown; findings below may reference images not displayed]

FINDINGS: Endotracheal tube at the thoracic inlet. Enteric tube in place, tip
and side-port in the stomach. Tip of the right internal jugular
Swan-Ganz catheter in the region the pulmonary outflow tract.
Question additional right internal jugular line, tip in the mid SVC.
Mediastinal drain and left chest tube in place. Post median
sternotomy and CABG. No definite pneumothorax. The cardiomediastinal
contours are unchanged. Mild bibasilar atelectasis. No frank
pulmonary edema.
IMPRESSION: 1. Post median sternotomy with support apparatus as described.
2. Mild bibasilar atelectasis.

## 2016-04-01 NOTE — Telephone Encounter (Signed)
Patient notified of advice below:   Please advise patient its okay to discontinue Plavix permanently. Continue aspirin.         Dr. Lemmie Evens        ----- Message -----     From: Rexene Alberts, MD     Sent: 03/30/2016  3:35 PM      To: Pixie Casino, MD    Med list updated.

## 2016-04-02 DIAGNOSIS — E118 Type 2 diabetes mellitus with unspecified complications: Secondary | ICD-10-CM | POA: Diagnosis not present

## 2016-04-02 DIAGNOSIS — E789 Disorder of lipoprotein metabolism, unspecified: Secondary | ICD-10-CM | POA: Diagnosis not present

## 2016-04-02 IMAGING — CR DG CHEST 1V PORT
1 series · 1 of 1 positions shown · non-contrast
Comparison: 03/29/2015

CLINICAL DATA: Postop day 2 following CABG surgery.

EXAM:
PORTABLE CHEST - 1 VIEW

[AP]
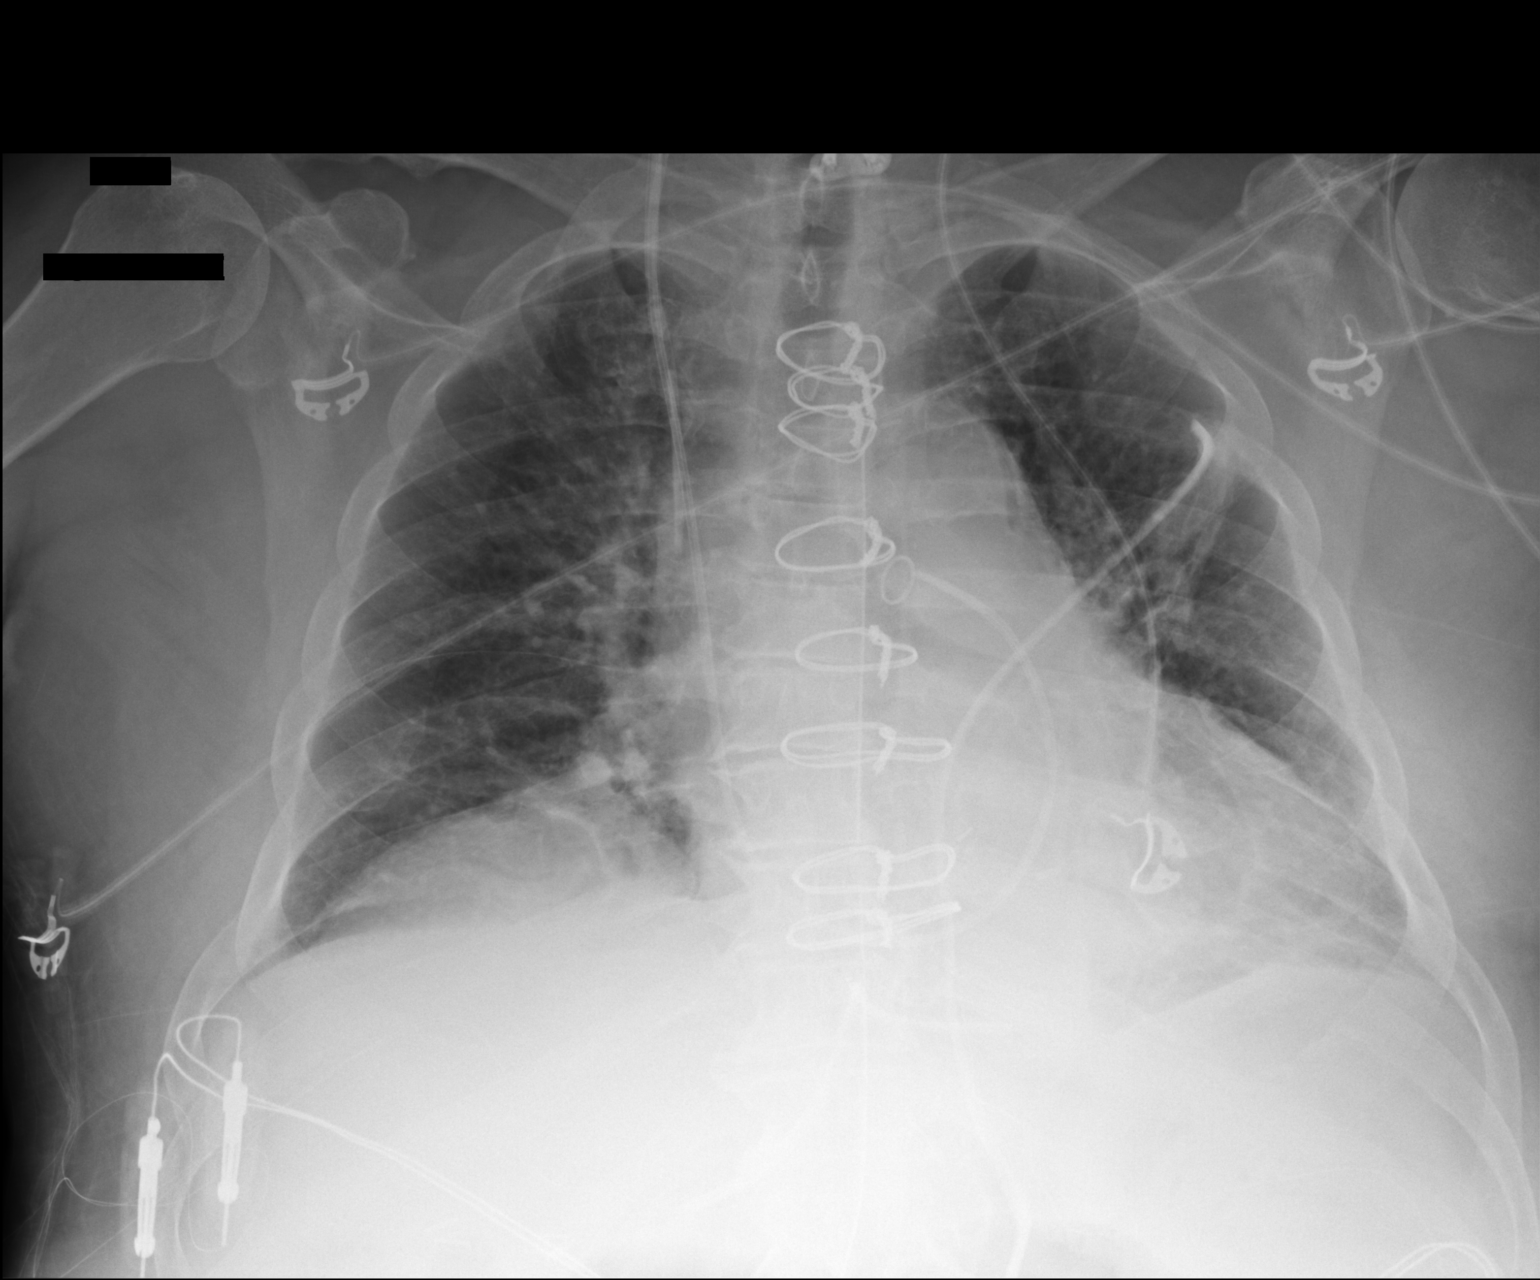

[1 of 1 positions shown; findings below may reference images not displayed]

FINDINGS: Since prior exam, the endotracheal tube and nasogastric tube have
been removed. Knee right internal jugular Swan-Ganz catheter,
mediastinal tube and left-sided chest tube are unchanged in
position.

There is no mediastinal widening. No pulmonary edema or
pneumothorax. There is perihilar and medial lung base atelectasis
similar to the prior exam.
IMPRESSION: 1. No evidence of an operative complication. No pulmonary edema,
mediastinal widening or obvious pneumothorax. Mild persistent
perihilar and medial lung base atelectasis.
2. Remaining support apparatus is stable and well positioned.

## 2016-04-03 IMAGING — CR DG CHEST 1V PORT
1 series · 1 of 1 positions shown · non-contrast
Comparison: 03/30/2015

CLINICAL DATA: BYPASS SURGERY.

EXAM:
PORTABLE CHEST - 1 VIEW

[AP]
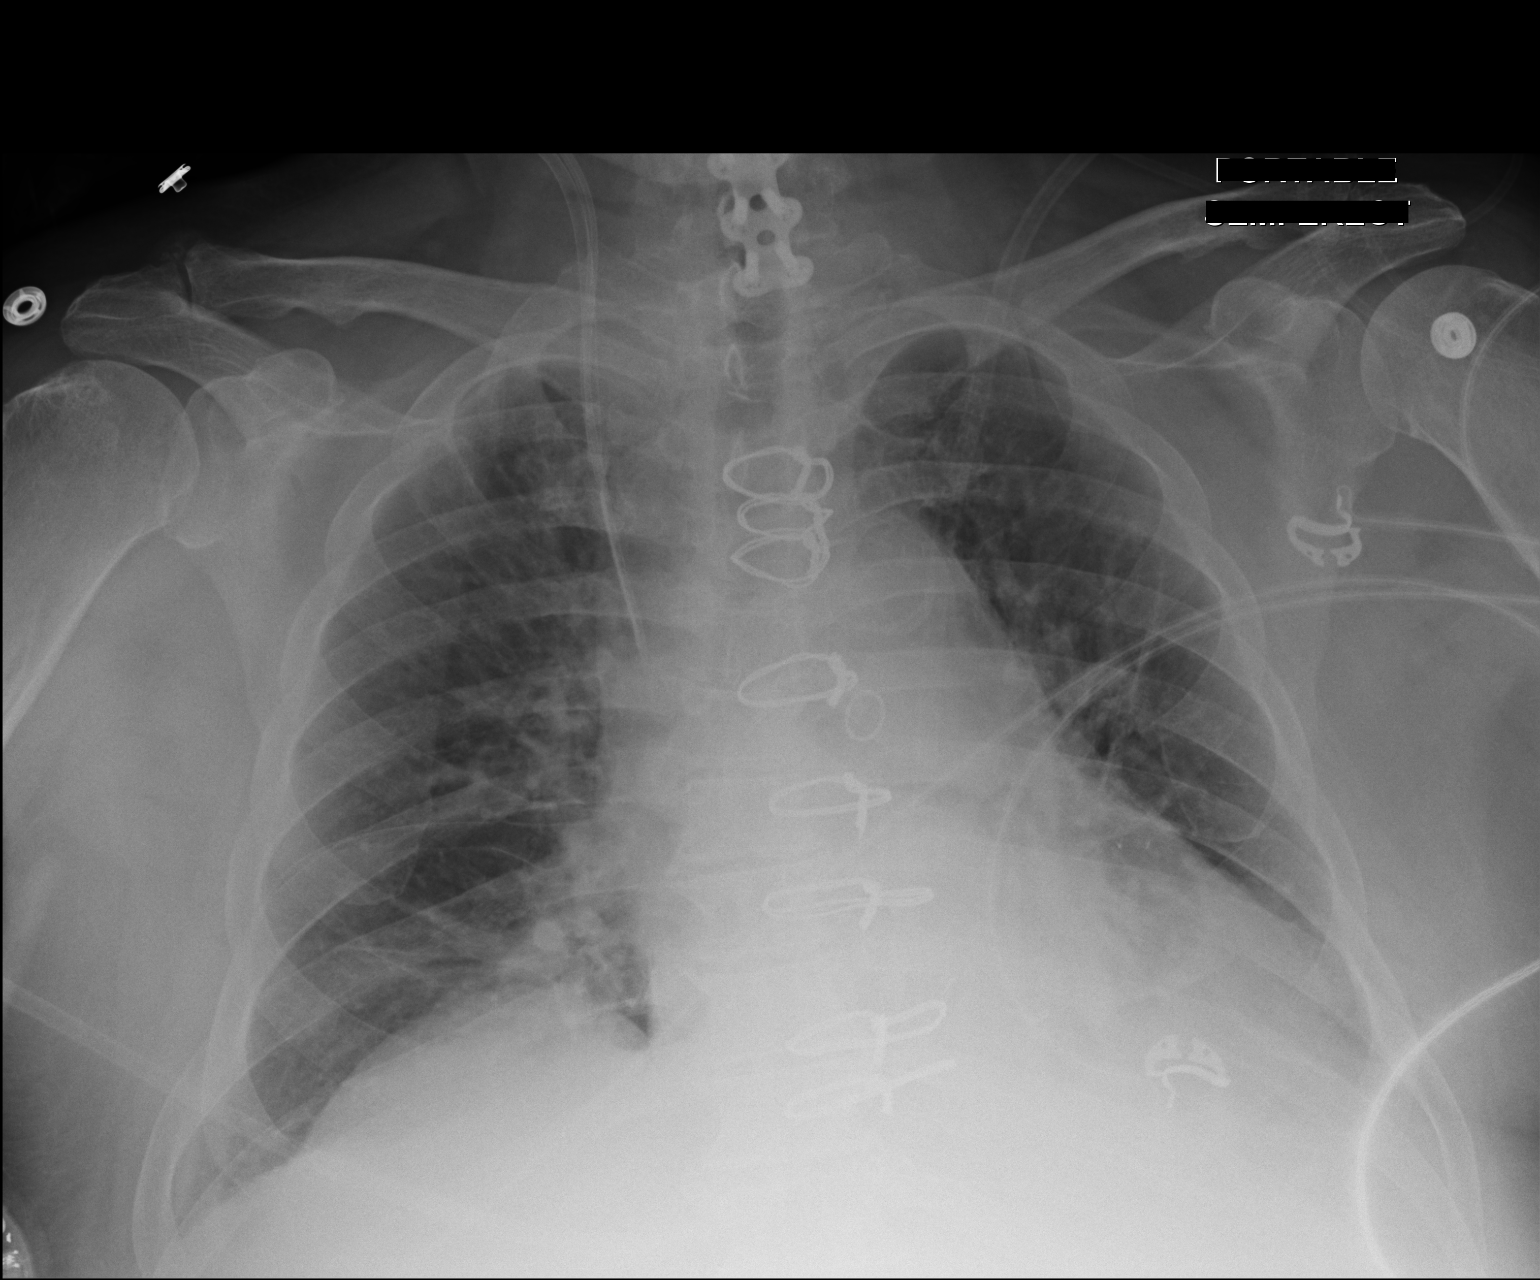

[1 of 1 positions shown; findings below may reference images not displayed]

FINDINGS: The Swan-Ganz catheter has been removed. The right IJ Cordis is
still in place. The right IJ central venous catheter is stable. The
mediastinal drain tube is been removed. The left-sided chest tube is
been removed. No pneumothorax is identified. Persistent vascular
congestion, small left effusion and bibasilar atelectasis. No
pulmonary edema.
IMPRESSION: Removal of Swan-Ganz catheter, mediastinal drain tube and left-sided
chest tube. No pneumothorax.

Persistent low lung volumes with vascular congestion, vascular
crowding, bibasilar atelectasis and small left effusion.

## 2016-04-09 DIAGNOSIS — E118 Type 2 diabetes mellitus with unspecified complications: Secondary | ICD-10-CM | POA: Diagnosis not present

## 2016-04-09 DIAGNOSIS — E032 Hypothyroidism due to medicaments and other exogenous substances: Secondary | ICD-10-CM | POA: Diagnosis not present

## 2016-04-09 DIAGNOSIS — M109 Gout, unspecified: Secondary | ICD-10-CM | POA: Diagnosis not present

## 2016-05-14 DIAGNOSIS — A09 Infectious gastroenteritis and colitis, unspecified: Secondary | ICD-10-CM | POA: Diagnosis not present

## 2016-05-25 DIAGNOSIS — D692 Other nonthrombocytopenic purpura: Secondary | ICD-10-CM | POA: Diagnosis not present

## 2016-05-25 DIAGNOSIS — M546 Pain in thoracic spine: Secondary | ICD-10-CM | POA: Diagnosis not present

## 2016-06-25 DIAGNOSIS — E8770 Fluid overload, unspecified: Secondary | ICD-10-CM | POA: Diagnosis not present

## 2016-06-25 DIAGNOSIS — N183 Chronic kidney disease, stage 3 (moderate): Secondary | ICD-10-CM | POA: Diagnosis not present

## 2016-06-25 DIAGNOSIS — Z951 Presence of aortocoronary bypass graft: Secondary | ICD-10-CM | POA: Diagnosis not present

## 2016-06-25 DIAGNOSIS — I509 Heart failure, unspecified: Secondary | ICD-10-CM | POA: Diagnosis not present

## 2016-06-25 DIAGNOSIS — E669 Obesity, unspecified: Secondary | ICD-10-CM | POA: Diagnosis not present

## 2016-06-30 DIAGNOSIS — E118 Type 2 diabetes mellitus with unspecified complications: Secondary | ICD-10-CM | POA: Diagnosis not present

## 2016-06-30 DIAGNOSIS — E789 Disorder of lipoprotein metabolism, unspecified: Secondary | ICD-10-CM | POA: Diagnosis not present

## 2016-06-30 DIAGNOSIS — Z793 Long term (current) use of hormonal contraceptives: Secondary | ICD-10-CM | POA: Diagnosis not present

## 2016-07-07 DIAGNOSIS — I2510 Atherosclerotic heart disease of native coronary artery without angina pectoris: Secondary | ICD-10-CM | POA: Diagnosis not present

## 2016-07-07 DIAGNOSIS — I10 Essential (primary) hypertension: Secondary | ICD-10-CM | POA: Diagnosis not present

## 2016-07-07 DIAGNOSIS — E789 Disorder of lipoprotein metabolism, unspecified: Secondary | ICD-10-CM | POA: Diagnosis not present

## 2016-07-07 DIAGNOSIS — E118 Type 2 diabetes mellitus with unspecified complications: Secondary | ICD-10-CM | POA: Diagnosis not present

## 2016-07-20 DIAGNOSIS — E118 Type 2 diabetes mellitus with unspecified complications: Secondary | ICD-10-CM | POA: Diagnosis not present

## 2016-07-24 DIAGNOSIS — E789 Disorder of lipoprotein metabolism, unspecified: Secondary | ICD-10-CM | POA: Diagnosis not present

## 2016-07-24 DIAGNOSIS — E118 Type 2 diabetes mellitus with unspecified complications: Secondary | ICD-10-CM | POA: Diagnosis not present

## 2016-07-24 DIAGNOSIS — Z793 Long term (current) use of hormonal contraceptives: Secondary | ICD-10-CM | POA: Diagnosis not present

## 2016-07-28 DIAGNOSIS — I10 Essential (primary) hypertension: Secondary | ICD-10-CM | POA: Diagnosis not present

## 2016-07-28 DIAGNOSIS — E789 Disorder of lipoprotein metabolism, unspecified: Secondary | ICD-10-CM | POA: Diagnosis not present

## 2016-07-28 DIAGNOSIS — E118 Type 2 diabetes mellitus with unspecified complications: Secondary | ICD-10-CM | POA: Diagnosis not present

## 2016-07-28 DIAGNOSIS — E032 Hypothyroidism due to medicaments and other exogenous substances: Secondary | ICD-10-CM | POA: Diagnosis not present

## 2016-07-29 DIAGNOSIS — M5126 Other intervertebral disc displacement, lumbar region: Secondary | ICD-10-CM | POA: Diagnosis not present

## 2016-07-29 DIAGNOSIS — M5416 Radiculopathy, lumbar region: Secondary | ICD-10-CM | POA: Diagnosis not present

## 2016-08-25 DIAGNOSIS — M25561 Pain in right knee: Secondary | ICD-10-CM | POA: Diagnosis not present

## 2016-08-26 DIAGNOSIS — Z79899 Other long term (current) drug therapy: Secondary | ICD-10-CM | POA: Diagnosis not present

## 2016-08-26 DIAGNOSIS — D696 Thrombocytopenia, unspecified: Secondary | ICD-10-CM | POA: Diagnosis not present

## 2016-08-26 DIAGNOSIS — M109 Gout, unspecified: Secondary | ICD-10-CM | POA: Diagnosis not present

## 2016-08-26 DIAGNOSIS — M25561 Pain in right knee: Secondary | ICD-10-CM | POA: Diagnosis not present

## 2016-08-26 DIAGNOSIS — M545 Low back pain: Secondary | ICD-10-CM | POA: Diagnosis not present

## 2016-09-02 DIAGNOSIS — G4733 Obstructive sleep apnea (adult) (pediatric): Secondary | ICD-10-CM | POA: Diagnosis not present

## 2016-09-16 ENCOUNTER — Ambulatory Visit (INDEPENDENT_AMBULATORY_CARE_PROVIDER_SITE_OTHER): Payer: Medicare Other | Admitting: Orthopedic Surgery

## 2016-09-16 DIAGNOSIS — M1711 Unilateral primary osteoarthritis, right knee: Secondary | ICD-10-CM | POA: Diagnosis not present

## 2016-09-18 DIAGNOSIS — Z23 Encounter for immunization: Secondary | ICD-10-CM | POA: Diagnosis not present

## 2016-11-19 DIAGNOSIS — E118 Type 2 diabetes mellitus with unspecified complications: Secondary | ICD-10-CM | POA: Diagnosis not present

## 2016-11-26 DIAGNOSIS — I10 Essential (primary) hypertension: Secondary | ICD-10-CM | POA: Diagnosis not present

## 2016-11-26 DIAGNOSIS — E118 Type 2 diabetes mellitus with unspecified complications: Secondary | ICD-10-CM | POA: Diagnosis not present

## 2016-11-26 DIAGNOSIS — E032 Hypothyroidism due to medicaments and other exogenous substances: Secondary | ICD-10-CM | POA: Diagnosis not present

## 2016-11-26 DIAGNOSIS — M109 Gout, unspecified: Secondary | ICD-10-CM | POA: Diagnosis not present

## 2016-12-02 DIAGNOSIS — A09 Infectious gastroenteritis and colitis, unspecified: Secondary | ICD-10-CM | POA: Diagnosis not present

## 2016-12-16 ENCOUNTER — Ambulatory Visit (INDEPENDENT_AMBULATORY_CARE_PROVIDER_SITE_OTHER): Payer: Medicare Other | Admitting: Nurse Practitioner

## 2016-12-16 ENCOUNTER — Encounter: Payer: Self-pay | Admitting: Nurse Practitioner

## 2016-12-16 VITALS — BP 142/69 | HR 69 | Ht 69.0 in | Wt 263.2 lb

## 2016-12-16 DIAGNOSIS — E785 Hyperlipidemia, unspecified: Secondary | ICD-10-CM | POA: Diagnosis not present

## 2016-12-16 DIAGNOSIS — R269 Unspecified abnormalities of gait and mobility: Secondary | ICD-10-CM | POA: Diagnosis not present

## 2016-12-16 DIAGNOSIS — R413 Other amnesia: Secondary | ICD-10-CM | POA: Diagnosis not present

## 2016-12-16 DIAGNOSIS — G629 Polyneuropathy, unspecified: Secondary | ICD-10-CM

## 2016-12-16 DIAGNOSIS — I639 Cerebral infarction, unspecified: Secondary | ICD-10-CM

## 2016-12-16 NOTE — Progress Notes (Signed)
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 At baseline he ambulates with a left ankle brace, still active at home, manage his own finances, driving without difficulty, was able to fix home electricity such as lawn mow without difficulty, over the past 6 months, he became forgetful, he tends to repeat himself, forgot name,   He had MRI at [facility], there is evidence of old left parietal subcortical stroke, mild small vessel disease,  echocardiogram July 2013 showed ejection fraction 55%,RVSP of 36, mild aortic valvular stenosis,  BUN and creatinine 23/1 point 38, LDL 45, triglyceride 104,  He has not had further stroke or TIA symtoms. He is currently on Plavix and aspirin with moderate bruising. He is also taking Aricept, tolerating medication well, he has no recurrent strokelike symptoms, he complains of bilateral hand, and feet paresthesia, previously tried and failed different neuropathic pain medications, this including Neurontin, Lyrica, Cymbalta, He was put on amitriptyline around March 2015, 50 mg twice a day, tolerating it well, it does help him moderately, but he still complains of dense numbness of his bilateral fingertips, and toes,  UPDATE 12/11/2014:YYHe is overall doing very well, tolerating current  medications, amitriptyline 50 mg every night has helped his sleep, he continued to ambulate with left ankle brace UPDATE 12/12/15 Randall Wilkins, 76 year old male returns for follow-up. Has a history of stroke found on MRI, evidence of left parietal subcortical stroke and mild small vessel disease in 2013. He is currently on Plavix and aspirin with moderate bruising. He has not had further stroke or TIA symptoms. He did have a heart attack with open-heart surgery in April 2016. His cardiac rehabilitation has completed. He also has a history of obstructive sleep apnea and claims he is compliant with CPAP. He has some mild memory loss and is currently taking Aricept without side effects. He also has insulin-dependent diabetic and has bilateral hand and feet paresthesias, currently controlled with amitriptyline . He uses a single-point cane to ambulate and denies any recent falls. He has a AFO to left lower leg. He returns for reevaluation UPDATE 01/03/2018CM Randall Wilkins, 76 year old male returns for follow-up. He has a history of stroke found on MRI evidence of left parietal subcortical stroke and multiple small vessel disease in 2013 he is currently on aspirin. He was taken off Plavix in April by Dr. Ricard Dillon. He has not had further stroke or TIA symptoms he had open-heart surgery in April 2016 and has done well he also has a history of obstructive sleep apnea and states he is compliant with CPAP. He is currently on Aricept for mild memory loss. He denies any side effects to the medication. He also has a diabetic neuropathy and this bilateral hand and feet paresthesias. He denies any recent falls. He has an AFO to the left lower leg and uses point cane to ambulate. He is also on amitriptyline for symptom control. He returns for reevaluation  REVIEW OF SYSTEMS: Full 14 system review of systems performed and notable only for those listed, all others are neg:  Constitutional: neg  Cardiovascular: neg Ear/Nose/Throat: neg   Skin: neg Eyes: neg Respiratory: neg Gastroitestinal: Diarrhea Hematology/Lymphatic: Easy bruising Endocrine: Intolerance to cold Musculoskeletal:  walking difficulty Allergy/Immunology: neg Neurological: Memory loss, numbness Psychiatric: neg Sleep : Obstructive sleep apnea with CPAP   ALLERGIES: Allergies  Allergen Reactions  . Actos [Pioglitazone] Swelling  . Metformin And Related Nausea Only  . Niaspan [Niacin Er] Itching and Rash    HOME MEDICATIONS: Outpatient Medications Prior to Visit  Medication Sig Dispense Refill  . allopurinol (ZYLOPRIM) 300 MG tablet Take 1 tablet by mouth daily. Take 1 tab by my mouth daily  2  . amitriptyline (ELAVIL) 50 MG tablet Take 50 mg by mouth 2 (two) times daily.     Marland Kitchen aspirin 81 MG tablet Take 1 tablet (81 mg total) by mouth daily.    . colchicine 0.6 MG tablet Take 0.6 mg by mouth daily as needed.    . donepezil (ARICEPT) 10 MG tablet TAKE 1 TABLET BY MOUTH DAILY 90 tablet 2  . Insulin Glargine 300 UNIT/ML SOPN Inject 60 Units into the skin every morning.    . insulin lispro (HUMALOG) 100 UNIT/ML injection Inject 20-25 Units into the skin 4 (four) times daily. Takes 25u in am, 20u at lunch, 20u in evening and 25u at bedtime    . LANTUS SOLOSTAR 100 UNIT/ML Solostar Pen   2  . levothyroxine (SYNTHROID, LEVOTHROID) 25 MCG tablet Take 25 mcg by mouth daily.   0  . lisinopril (PRINIVIL,ZESTRIL) 5 MG tablet Take 5 mg by mouth daily.     . metoprolol succinate (TOPROL-XL) 25 MG 24 hr tablet Take 1.5 tablets (37.5 mg total) by mouth daily. 90 tablet 3  . NON FORMULARY Inhale 1 application into the lungs at bedtime. CPAP    . pantoprazole (PROTONIX) 40 MG tablet Take 40 mg by mouth daily.    . potassium chloride SA (K-DUR,KLOR-CON) 20 MEQ tablet Take 20 mEq by mouth daily.     . rosuvastatin (CRESTOR) 20 MG tablet Take 20 mg by mouth daily.    . Tamsulosin HCl (FLOMAX) 0.4 MG CAPS Take 0.4 mg by mouth daily.     Marland Kitchen torsemide (DEMADEX) 10 MG  tablet Take 50 mg by mouth daily.    . colestipol (COLESTID) 1 g tablet Take by mouth. Take 2 tabs in the morning and 2.5 tabs in the eveninh    . fish oil-omega-3 fatty acids 1000 MG capsule Take 1 g by mouth 2 (two) times daily.     Marland Kitchen HYDROcodone-acetaminophen (VICODIN) 5-500 MG per tablet Take 2 tablets by mouth at bedtime.      No facility-administered medications prior to visit.     PAST MEDICAL HISTORY: Past Medical History:  Diagnosis Date  . Aortic stenosis, mild 11/14/2013  . Aortic valve sclerosis 03/29/2015  . Bilateral leg edema 05/21/2014  . CAD (coronary artery disease)   . Chronic diastolic congestive heart failure ([place])   . Chronic kidney disease (CKD), stage III (moderate)   . Diabetes 1.5, managed as type 1 ([place]) 02/04/2013  . Diabetic peripheral neuropathy associated with type 1 diabetes mellitus ([place]) 11/14/2013  . Dysphagia   . Dyspnea on exertion 03/21/2015  . Exogenous obesity   . Gout   . Heart attack   . History of nuclear stress test 07/2011   dipyridamole; fixed inferolateral defect, worse at  stress than rest; no reversible ischemia; low risk scan   . Hyperlipidemia   . Hypertension   . Insulin dependent diabetes mellitus ([place])   . Left foot drop   . Left main coronary artery disease 03/28/2015  . Memory loss   . Obesity (BMI 30-39.9) 11/14/2013  . Obstructive sleep apnea 03/21/2015  . OSA on CPAP    uses a cpap  . Peptic ulcer with hemorrhage 03/28/2015  . Peripheral neuropathy ([place])   . Rhabdomyolysis   . 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 Stroke ([place])    L patietal with small scattered lacunar infarcts  . Thrombocytopenia ([place]) 03/21/2015  . Venous insufficiency   . Weakness generalized 03/21/2015    PAST SURGICAL HISTORY: Past Surgical History:  Procedure Laterality Date  . BACK SURGERY  2002   lumbosacral  . Carotid Doppler  03/2013   bilat bulb/prox ICAs - mild amount of fibrous plaque with no evidence of  diameter reduction  . CARPAL TUNNEL RELEASE Bilateral 08/09/2014   Procedure: BILATERAL CARPAL TUNNEL RELEASE;  Surgeon: Daryll Brod, MD;  Location: [facility];  Service: Orthopedics;  Laterality: Bilateral;  ANESTHESIA:  IV REGIONAL BIL FAB  . CHOLECYSTECTOMY    . COLONOSCOPY    . CORONARY ANGIOPLASTY  10/13/1996  . CORONARY ANGIOPLASTY  09/21/1989   emergency PTCA  . CORONARY ANGIOPLASTY  10/13/1996   Multi-Link diagonal & OD stenting (Dr. Marella Chimes)  . CORONARY ANGIOPLASTY  12/03/1997   disease of mid DX-1 ~50% & in mid PLA & PDA (distal lesions) (Dr. Marella Chimes)   . CORONARY ANGIOPLASTY  10/14/1999   progression of disease distal PLA & PDA; progression of disease prox RCA - moderate (Dr. Marella Chimes)   . CORONARY ANGIOPLASTY WITH STENT PLACEMENT  04/04/2004   4.0x27m non-DES (thrombectomy via AngioJet) to RCA for high grade stenosis (Dr. RMarella Chimes  . CORONARY ARTERY BYPASS GRAFT N/A 03/29/2015   Procedure: CORONARY ARTERY BYPASS GRAFTING TIMES TWO USING LEFT INTERNAL MAMMARY ARTERY AND RIGHT LEG GREATER SAPHENOUS VEIN HARVESTED ENDOSCOPICALLY.;  Surgeon: CRexene Alberts MD;  [facility]  Service: Open Heart Surgery;  Laterality: N/A;  . ESOPHAGOGASTRODUODENOSCOPY N/A 03/27/2015   Procedure: ESOPHAGOGASTRODUODENOSCOPY (EGD);  Surgeon: MClarene Essex MD;  [facility];  Service: Endoscopy;  Laterality: N/A;  possible dilation  . LEFT HEART CATHETERIZATION WITH CORONARY ANGIOGRAM N/A 03/28/2015   Procedure: LEFT HEART CATHETERIZATION WITH CORONARY ANGIOGRAM;  Surgeon: Peter M JMartinique MD;  [facility];  Service: Cardiovascular;  Laterality: N/A;  . ROTATOR CUFF REPAIR Right   . SINUS ENDO W/FUSION    . TONSILLECTOMY    . TRANSTHORACIC ECHOCARDIOGRAM  08/08/2013   EF 55-60%, mild conc hypertrophy, grade 1 diastolic dysfunction; AV with mild stenosis; LA & RA mildly dilated    FAMILY HISTORY: Family History  Problem Relation Age of Onset  . Heart  disease Mother   . Coronary artery disease Father   . Cancer Maternal Grandmother   . Heart Problems Maternal Grandfather     SOCIAL HISTORY: Social History   Social History  . Marital status: Married    Spouse name: N/A  . Number of children: 3  . Years of education: 12   Occupational History  . Not on file.   Social History Main Topics  . Smoking status: Former Smoker    Packs/day: 2.00    Years: 20.00  Types: Cigarettes    Quit date: 01/25/1973  . Smokeless tobacco: Never Used  . Alcohol use No  . Drug use: No  . Sexual activity: Not on file   Other Topics Concern  . Not on file   Social History Narrative   Lives with wife.      PHYSICAL EXAM  Vitals:   12/16/16 0806  BP: (!) 142/69  Pulse: 69  Weight: 263 lb 3.2 oz (119.4 kg)  Height: '5\' 9"'$  (1.753 m)   Body mass index is 38.87 kg/m. Generalized: Well developed, obese male in no acute distress  Head: normocephalic and atraumatic,. Oropharynx benign  Neck: Supple, no carotid bruits  Cardiac: Regular rate rhythm, no murmur  Musculoskeletal: Left foot drop AFO In place Neurological examination   Mentation: Alert oriented to time, place, history taking. MMSE - Mini Mental State Exam 12/16/2016 12/12/2015 12/11/2014  Orientation to time '4 4 4  '$ Orientation to Place '5 5 5  '$ Registration '3 3 3  '$ Attention/ Calculation '4 5 5  '$ Recall 0 1 3  Language- name 2 objects '2 2 2  '$ Language- repeat '1 1 1  '$ Language- follow 3 step command '3 3 2  '$ Language- read & follow direction '1 1 1  '$ Write a sentence '1 1 1  '$ Copy design '1 1 1  '$ Total score '25 27 28  '$ . AFT 8. Clock drawing 4/4.  Cranial nerve II-XII: Pupils were equal round reactive to light extraocular movements were full, visual field were full on confrontational test. Facial sensation and strength were normal. Hearing was intact to finger rubbing bilaterally. Uvula tongue midline. head turning and shoulder shrug were normal and symmetric.Tongue protrusion into  cheek strength was normal. Motor: normal bulk and tone, full strength in the BUE, BLE, on the right , left lower extremity weakness with AFO in place and footdrop  Sensory: Decreased pinprick and vibratory at both legs to midshin level, decreased pinprick at finger tips Coordination: finger-nose-finger, heel-to-shin normal on the right, unable to perform on the left  Reflexes: 1+ and symmetric except absent Achilles plantar responses were flexor bilaterally. Gait and Station: left foot drop with left AFO, cautious, mildly unsteady gait with single-point cane  DIAGNOSTIC DATA (LABS, IMAGING, TESTING) - I reviewed patient records, labs, notes, testing and imaging myself where available.  Lab Results  Component Value Date   WBC 13.0 (H) 04/05/2015   HGB 10.7 (L) 04/05/2015   HCT 33.3 (L) 04/05/2015   MCV 90.2 04/05/2015   PLT 280 04/05/2015      Component Value Date/Time   NA 140 04/05/2015 1209   K 4.1 04/05/2015 1209   CL 99 04/05/2015 1209   CO2 27 04/05/2015 1209   GLUCOSE 145 (H) 04/05/2015 1209   BUN 28 (H) 04/05/2015 1209   CREATININE 2.01 (H) 04/05/2015 1209   CALCIUM 8.7 04/05/2015 1209   PROT 5.3 (L) 03/29/2015 0054   ALBUMIN 2.4 (L) 03/29/2015 0054   AST 29 03/29/2015 0054   ALT 29 03/29/2015 0054   ALKPHOS 62 03/29/2015 0054   BILITOT 0.7 03/29/2015 0054   GFRNONAA 31 (L) 04/05/2015 1209   GFRAA 36 (L) 04/05/2015 1209    Lab Results  Component Value Date   HGBA1C 7.7 (H) 03/29/2015     ASSESSMENT AND PLAN  76 y.o. year old male has a past medical history of rhabdomyolysis, with persistent left foot drop, wear left AFO, baseline gait difficulty,; left parietal subcortical stroke, small vessel disease and Memory loss.He has bilateral  hands and feet neuropathic pain, numbness, which has improved with amitriptyline 50 mg twice a day.The patient is a current patient of Dr. Krista Blue  who is out of the office today . This note is sent to the work in doctor.      Continue Aricept at current dose for memory loss Continue ASA for secondary stroke prevention Continue Crestor for hyperlipidemia Keep blood pressure less than 130/90 today's reading 142/69 continue antihypertensive medications Continue Amitriptyline twice daily for diabetic neuropathy AFO to left leg at all times, use cane for stable ambulation, risk for falls Memory score is stable Follow-up yearly next with Dr. Luan Pulling, [facility], [facility], [facility] [address] [place], [place] [address] [phone]

## 2016-12-16 NOTE — Patient Instructions (Signed)
Continue Aricept at current dose for memory loss Continue ASA for secondary stroke prevention Continue Amitriptyline twice daily for diabetic neuropathy AFO to left leg at all times, use cane for stable ambulation Memory score is stable

## 2016-12-18 NOTE — Progress Notes (Signed)
I agree with the above plan 

## 2017-01-04 DIAGNOSIS — L570 Actinic keratosis: Secondary | ICD-10-CM | POA: Diagnosis not present

## 2017-01-04 DIAGNOSIS — G4733 Obstructive sleep apnea (adult) (pediatric): Secondary | ICD-10-CM | POA: Diagnosis not present

## 2017-02-17 DIAGNOSIS — E119 Type 2 diabetes mellitus without complications: Secondary | ICD-10-CM | POA: Diagnosis not present

## 2017-02-17 DIAGNOSIS — I2510 Atherosclerotic heart disease of native coronary artery without angina pectoris: Secondary | ICD-10-CM | POA: Diagnosis not present

## 2017-02-17 DIAGNOSIS — I10 Essential (primary) hypertension: Secondary | ICD-10-CM | POA: Diagnosis not present

## 2017-02-17 DIAGNOSIS — E039 Hypothyroidism, unspecified: Secondary | ICD-10-CM | POA: Diagnosis not present

## 2017-02-18 DIAGNOSIS — E118 Type 2 diabetes mellitus with unspecified complications: Secondary | ICD-10-CM | POA: Diagnosis not present

## 2017-02-18 DIAGNOSIS — E789 Disorder of lipoprotein metabolism, unspecified: Secondary | ICD-10-CM | POA: Diagnosis not present

## 2017-02-23 DIAGNOSIS — M545 Low back pain: Secondary | ICD-10-CM | POA: Diagnosis not present

## 2017-02-23 DIAGNOSIS — M109 Gout, unspecified: Secondary | ICD-10-CM | POA: Diagnosis not present

## 2017-02-23 DIAGNOSIS — M25562 Pain in left knee: Secondary | ICD-10-CM | POA: Diagnosis not present

## 2017-02-23 DIAGNOSIS — M25561 Pain in right knee: Secondary | ICD-10-CM | POA: Diagnosis not present

## 2017-02-25 DIAGNOSIS — E118 Type 2 diabetes mellitus with unspecified complications: Secondary | ICD-10-CM | POA: Diagnosis not present

## 2017-02-25 DIAGNOSIS — I10 Essential (primary) hypertension: Secondary | ICD-10-CM | POA: Diagnosis not present

## 2017-02-25 DIAGNOSIS — E789 Disorder of lipoprotein metabolism, unspecified: Secondary | ICD-10-CM | POA: Diagnosis not present

## 2017-02-25 DIAGNOSIS — N400 Enlarged prostate without lower urinary tract symptoms: Secondary | ICD-10-CM | POA: Diagnosis not present

## 2017-03-01 ENCOUNTER — Other Ambulatory Visit: Payer: Self-pay | Admitting: Dermatology

## 2017-03-01 DIAGNOSIS — L570 Actinic keratosis: Secondary | ICD-10-CM | POA: Diagnosis not present

## 2017-03-01 DIAGNOSIS — D485 Neoplasm of uncertain behavior of skin: Secondary | ICD-10-CM | POA: Diagnosis not present

## 2017-03-04 ENCOUNTER — Encounter: Payer: Self-pay | Admitting: Internal Medicine

## 2017-03-04 ENCOUNTER — Ambulatory Visit (INDEPENDENT_AMBULATORY_CARE_PROVIDER_SITE_OTHER): Payer: Medicare Other | Admitting: Internal Medicine

## 2017-03-04 VITALS — BP 130/60 | HR 59 | Ht 67.0 in | Wt 270.0 lb

## 2017-03-04 DIAGNOSIS — I2510 Atherosclerotic heart disease of native coronary artery without angina pectoris: Secondary | ICD-10-CM

## 2017-03-04 DIAGNOSIS — N183 Chronic kidney disease, stage 3 unspecified: Secondary | ICD-10-CM

## 2017-03-04 DIAGNOSIS — I519 Heart disease, unspecified: Secondary | ICD-10-CM

## 2017-03-04 DIAGNOSIS — R0602 Shortness of breath: Secondary | ICD-10-CM | POA: Diagnosis not present

## 2017-03-04 DIAGNOSIS — R011 Cardiac murmur, unspecified: Secondary | ICD-10-CM | POA: Diagnosis not present

## 2017-03-04 DIAGNOSIS — Z9861 Coronary angioplasty status: Secondary | ICD-10-CM | POA: Diagnosis not present

## 2017-03-04 NOTE — Patient Instructions (Addendum)
Your physician has requested that you have an echocardiogram @ [address] - [address]. Echocardiography is a painless test that uses sound waves to create images of your heart. It provides your doctor with information about the size and shape of your heart and how well your heart's chambers and valves are working. This procedure takes approximately one hour. There are no restrictions for this procedure.  Your physician wants you to follow-up in: ONE YEAR with Dr. Debara Pickett. You will receive a reminder letter in the mail two months in advance. If you don't receive a letter, please call our office to schedule the follow-up appointment.  PLEASE CALL WITH YOUR LISINOPRIL DOSE

## 2017-03-04 NOTE — Progress Notes (Signed)
OFFICE NOTE  Chief Complaint:  No complaints  Primary Care Physician: Simona Huh, MD  HPI:  Randall Wilkins is a pleasant 76 year old male patient who was formerly followed by Dr. Rollene Fare. He is here to establish cardiac care with [place] today. He has a history of obesity, multiple comorbidities including insulin-dependent diabetes, diabetic peripheral neuropathy, diabetic nephropathy, venous insufficiency and peripheral neuropathy. He also has obstructive sleep apnea on CPAP and coronary artery disease. He unfortunately suffered a left parietal stroke with small lacunar infarcts noted on MRI in 2013. He is a left foot drop from an L4/L5 neuropathy and wears a brace for this. From a cardiac standpoint he had a large non-drug-eluting stent placed in the RCA in 2005. He has had a stent placed at 89 to a diagonal which was noted to be patent. He's had some problems with dizziness which is improved. He [place] as large to medium secondary to neuropathy and venous insufficiency. Unfortunately not been able to lose a lot of weight due to his difficulty in ambulating and painful neuropathy.  He denies any cardiac chest pain or worsening shortness of breath.  [name] for a six-month followup. He is having no complaints. He does report some lower extremity swelling which is stable. He has mild aortic stenosis which is been stable as well. His last echo was in August 2014. He denies any chest pain or worsening shortness of breath. He recently had lab work to Dr. Eugenio Hoes office which we will obtain.  I saw [name] today for hospital follow-up. He is reportedly doing much better. He has more energy and no significant chest discomfort. Between our last appointment see presented with a urinary tract infection and ultimately had chest pain and profuse diaphoresis. He was found to have elevated troponin and underwent cardiac catheterization. This demonstrated the following:  Coronary  angiography: Coronary dominance: right  Left mainstem: 95% distal left main stenosis.  Left anterior descending (LAD): There is 95% ostial LAD stenosis. The LAD is a large vessel. The LAD and diagonal otherwise are without significant disease.  There is a large ramus intermediate branch with 99% ostial stenosis.  Left circumflex (LCx): 100% ostial occlusion with faint left to left collaterals.  Right coronary artery (RCA): the RCA is a large dominant branch. The stent in the proximal vessel is widely patent. There is a 60% stenosis in the mid PDA. The third PLOM is moderate in size with a long 90-95% stenosis prior to distal arborization.   Subsequently he underwent emergent coronary artery bypass grafting with a LIMA to LAD and SVG to OM branch. He is noted to have mild aortic sclerosis versus stenosis, however the valve was not severe enough to be addressed. Since discharge he is more active and is starting cardiac rehabilitation today.  [name] for follow-up. Overall he is feeling well and continues to improve in his ambulation and stamina. He denies any chest pain or shortness of breath. He is rehabilitating well. Recent laboratory work shows excellent cholesterol control and total cholesterol 130, HDL 42 LDL 61 and triglycerides 137. His only concern is the cost of medications.  I saw [name] today in the office. He denies any chest pain or worsening shortness of breath. Unfortunately he's had about several weeks of diarrhea which started when he went to the [facility] area. It was not felt to be infectious any seeing a gastroenterologist about this. Otherwise he is without cardiac  complaints. Blood pressure today was low at 114/52. He's managed to lose only a couple of pounds but generally his appetite stools been pretty good despite the diarrhea. He is planning on having an upcoming injection and I received a request to hold his Plavix for 5 days prior to that. He  is currently holding it will have his injection on Monday. Based on this I reviewed his records and I do not see a clear ongoing indication for the Plavix in addition to aspirin. Now that he's been surgically revascularized.   03/04/2017  [name] for follow-up. Recently he's had some dizziness and apparently had lower blood pressure. He backed off on his lisinopril to 2.5 mg daily, or at least he says he is taking a half tablet. Will try to figure out what the dose was. EKG today shows sinus bradycardia with either a first degree AV block of great significance or possibly atrial fibrillation. It's difficult to see consistent P waves with normal PP intervals. The RR intervals are slightly irregular. He has had sinus with a long first-degree AV block of 340 ms in the past. This is important to determine as he has had prior stroke and would necessitate anticoagulation.  PMHx:  Past Medical History:  Diagnosis Date  . Aortic stenosis, mild 11/14/2013  . Aortic valve sclerosis 03/29/2015  . Bilateral leg edema 05/21/2014  . CAD (coronary artery disease)   . Chronic diastolic congestive heart failure ([place])   . Chronic kidney disease (CKD), stage III (moderate)   . Diabetes 1.5, managed as type 1 ([place]) 02/04/2013  . Diabetic peripheral neuropathy associated with type 1 diabetes mellitus ([place]) 11/14/2013  . Dysphagia   . Dyspnea on exertion 03/21/2015  . Exogenous obesity   . Gout   . Heart attack   . History of nuclear stress test 07/2011   dipyridamole; fixed inferolateral defect, worse at stress than rest; no reversible ischemia; low risk scan   . Hyperlipidemia   . Hypertension   . Insulin dependent diabetes mellitus ([place])   . Left foot drop   . Left main coronary artery disease 03/28/2015  . Memory loss   . Obesity (BMI 30-39.9) 11/14/2013  . Obstructive sleep apnea 03/21/2015  . OSA on CPAP    uses a cpap  . Peptic ulcer with hemorrhage 03/28/2015  . Peripheral neuropathy ([place])     . Rhabdomyolysis   . 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 Stroke ([place])    L patietal with small scattered lacunar infarcts  . Thrombocytopenia ([place]) 03/21/2015  . Venous insufficiency   . Weakness generalized 03/21/2015    Past Surgical History:  Procedure Laterality Date  . BACK SURGERY  2002   lumbosacral  . Carotid Doppler  03/2013   bilat bulb/prox ICAs - mild amount of fibrous plaque with no evidence of diameter reduction  . CARPAL TUNNEL RELEASE Bilateral 08/09/2014   Procedure: BILATERAL CARPAL TUNNEL RELEASE;  Surgeon: Daryll Brod, MD;  Location: [facility];  Service: Orthopedics;  Laterality: Bilateral;  ANESTHESIA:  IV REGIONAL BIL FAB  . CHOLECYSTECTOMY    . COLONOSCOPY    . CORONARY ANGIOPLASTY  10/13/1996  . CORONARY ANGIOPLASTY  09/21/1989   emergency PTCA  . CORONARY ANGIOPLASTY  10/13/1996   Multi-Link diagonal & OD stenting (Dr. Marella Chimes)  . CORONARY ANGIOPLASTY  12/03/1997   disease of mid DX-1 ~50% & in mid PLA &  PDA (distal lesions) (Dr. Marella Chimes)   . CORONARY ANGIOPLASTY  10/14/1999   progression of disease distal PLA & PDA; progression of disease prox RCA - moderate (Dr. Marella Chimes)   . CORONARY ANGIOPLASTY WITH STENT PLACEMENT  04/04/2004   4.0x74m non-DES (thrombectomy via AngioJet) to RCA for high grade stenosis (Dr. RMarella Chimes  . CORONARY ARTERY BYPASS GRAFT N/A 03/29/2015   Procedure: CORONARY ARTERY BYPASS GRAFTING TIMES TWO USING LEFT INTERNAL MAMMARY ARTERY AND RIGHT LEG GREATER SAPHENOUS VEIN HARVESTED ENDOSCOPICALLY.;  Surgeon: CRexene Alberts MD;  [facility]  Service: Open Heart Surgery;  Laterality: N/A;  . ESOPHAGOGASTRODUODENOSCOPY N/A 03/27/2015   Procedure: ESOPHAGOGASTRODUODENOSCOPY (EGD);  Surgeon: MClarene Essex MD;  [facility];  Service: Endoscopy;  Laterality: N/A;  possible dilation  . LEFT HEART CATHETERIZATION WITH CORONARY ANGIOGRAM N/A 03/28/2015   Procedure:  LEFT HEART CATHETERIZATION WITH CORONARY ANGIOGRAM;  Surgeon: Peter M JMartinique MD;  [facility];  Service: Cardiovascular;  Laterality: N/A;  . ROTATOR CUFF REPAIR Right   . SINUS ENDO W/FUSION    . TONSILLECTOMY    . TRANSTHORACIC ECHOCARDIOGRAM  08/08/2013   EF 55-60%, mild conc hypertrophy, grade 1 diastolic dysfunction; AV with mild stenosis; LA & RA mildly dilated    FAMHx:  Family History  Problem Relation Age of Onset  . Heart disease Mother   . Coronary artery disease Father   . Cancer Maternal Grandmother   . Heart Problems Maternal Grandfather     SOCHx:   reports that he quit smoking about 44 years ago. His smoking use included Cigarettes. He has a 40.00 pack-year smoking history. He has never used smokeless tobacco. He reports that he does not drink alcohol or use drugs.  ALLERGIES:  Allergies  Allergen Reactions  . Actos [Pioglitazone] Swelling  . Metformin And Related Nausea Only  . Niaspan [Niacin Er] Itching and Rash    ROS: Pertinent items noted in HPI and remainder of comprehensive ROS otherwise negative.  HOME MEDS: Current Outpatient Prescriptions  Medication Sig Dispense Refill  . allopurinol (ZYLOPRIM) 300 MG tablet Take 1 tablet by mouth daily. Take 1 tab by my mouth daily  2  . amitriptyline (ELAVIL) 75 MG tablet Take 1 tablet by mouth daily.  0  . aspirin 81 MG tablet Take 1 tablet (81 mg total) by mouth daily.    . colchicine 0.6 MG tablet Take 0.6 mg by mouth daily as needed.    . donepezil (ARICEPT) 10 MG tablet TAKE 1 TABLET BY MOUTH DAILY 90 tablet 2  . insulin lispro (HUMALOG) 100 UNIT/ML injection Inject 20-25 Units into the skin 4 (four) times daily. Takes 25u in am, 20u at lunch, 20u in evening and 25u at bedtime    . LANTUS SOLOSTAR 100 UNIT/ML Solostar Pen Inject 60 Units into the skin 2 (two) times daily.   2  . levothyroxine (SYNTHROID, LEVOTHROID) 50 MCG tablet Take 50 mcg by mouth daily before breakfast.    . lisinopril  (PRINIVIL,ZESTRIL) 5 MG tablet Take 5 mg by mouth daily.     . metoprolol succinate (TOPROL-XL) 25 MG 24 hr tablet Take 1.5 tablets (37.5 mg total) by mouth daily. 90 tablet 3  . NON FORMULARY Inhale 1 application into the lungs at bedtime. CPAP    . pantoprazole (PROTONIX) 40 MG tablet Take 40 mg by mouth daily.    . potassium chloride SA (K-DUR,KLOR-CON) 20 MEQ tablet Take 20 mEq by mouth daily.     .Marland Kitchen  rosuvastatin (CRESTOR) 20 MG tablet Take 20 mg by mouth daily.    . Tamsulosin HCl (FLOMAX) 0.4 MG CAPS Take 0.4 mg by mouth daily.     Marland Kitchen torsemide (DEMADEX) 10 MG tablet Take '50mg'$  by mouth alternating with '100mg'$ .     No current facility-administered medications for this visit.     LABS/IMAGING: No results found for this or any previous visit (from the past 48 hour(s)). No results found.  VITALS: BP 130/60   Pulse (!) 59   Ht '5\' 7"'$  (1.702 m)   Wt 270 lb (122.5 kg)   BMI 42.29 kg/m   EXAM: General appearance: alert and no distress Neck: no carotid bruit and no JVD Lungs: clear to auscultation bilaterally Heart: regular rate and rhythm, S1, S2 normal, systolic murmur: 3/6 early to mid-peaking systolic murmur 3/6, crescendo at 2nd right intercostal space and occasional missed beats Abdomen: obese, soft, non-tender, +BS Extremities: edema trace Pulses: 2+ and symmetric Skin: Skin color, texture, turgor normal. No rashes or lesions Neurologic: Mental status: Alert, oriented, thought content appropriate Psych: Pleasant mood, affect  EKG: Suspect sinus bradycardia 59 with long first-degree AV block, although cannot rule out atrial fibrillation  ASSESSMENT: 1. Coronary artery disease status post BMS to the RCA in 2005, recent CABG 2 (LIMA to LAD - for critical left main disease, SVG to OM1)  2. Obesity 3. Hypertension-controlled 4. Dyslipidemia 5. Prior stroke 6. Diabetic neuropathy 7. Diabetic nephropathy - left foot drop 8. Type 1 diabetes on insulin 9. LE edema 10. Mild  aortic stenosis  PLAN: 1.   [name] is a borderline EKG which I believe is sinus rhythm with a long first-degree AV block, however there is some slight irregularity of the rhythm and I cannot exclude A. fib. I like to get an echocardiogram to see if he has any active atrial activity. In addition he's recently been told that he has a louder murmur. In the past he had some mild aortic stenosis. He is due for repeat echo to reassess his aortic valve. I'll contact him with those results and if they're still continued question of possible A. fib we may need to place him on a monitor for 30 days to see if we can identify it.  Follow-up annually or sooner as necessary.   Pixie Casino, MD, [facility] Attending Cardiologist  [place] [facility] Rola Lennon 03/04/2017, 1:35 PM

## 2017-03-19 ENCOUNTER — Other Ambulatory Visit (HOSPITAL_COMMUNITY): Payer: Medicare Other

## 2017-04-01 ENCOUNTER — Ambulatory Visit (HOSPITAL_COMMUNITY): Payer: Medicare Other | Attending: Cardiology

## 2017-04-01 ENCOUNTER — Other Ambulatory Visit: Payer: Self-pay

## 2017-04-01 DIAGNOSIS — R011 Cardiac murmur, unspecified: Secondary | ICD-10-CM | POA: Diagnosis not present

## 2017-04-01 DIAGNOSIS — Z6841 Body Mass Index (BMI) 40.0 and over, adult: Secondary | ICD-10-CM | POA: Diagnosis not present

## 2017-04-01 DIAGNOSIS — G4733 Obstructive sleep apnea (adult) (pediatric): Secondary | ICD-10-CM | POA: Insufficient documentation

## 2017-04-01 DIAGNOSIS — E669 Obesity, unspecified: Secondary | ICD-10-CM | POA: Diagnosis not present

## 2017-04-01 DIAGNOSIS — E785 Hyperlipidemia, unspecified: Secondary | ICD-10-CM | POA: Diagnosis not present

## 2017-04-01 DIAGNOSIS — I350 Nonrheumatic aortic (valve) stenosis: Secondary | ICD-10-CM | POA: Insufficient documentation

## 2017-04-01 DIAGNOSIS — N189 Chronic kidney disease, unspecified: Secondary | ICD-10-CM | POA: Diagnosis not present

## 2017-04-01 DIAGNOSIS — I509 Heart failure, unspecified: Secondary | ICD-10-CM | POA: Diagnosis not present

## 2017-04-01 DIAGNOSIS — I130 Hypertensive heart and chronic kidney disease with heart failure and stage 1 through stage 4 chronic kidney disease, or unspecified chronic kidney disease: Secondary | ICD-10-CM | POA: Diagnosis not present

## 2017-04-01 DIAGNOSIS — Z951 Presence of aortocoronary bypass graft: Secondary | ICD-10-CM | POA: Diagnosis not present

## 2017-04-01 DIAGNOSIS — I2510 Atherosclerotic heart disease of native coronary artery without angina pectoris: Secondary | ICD-10-CM | POA: Diagnosis not present

## 2017-04-01 DIAGNOSIS — E1122 Type 2 diabetes mellitus with diabetic chronic kidney disease: Secondary | ICD-10-CM | POA: Insufficient documentation

## 2017-04-01 DIAGNOSIS — Z8249 Family history of ischemic heart disease and other diseases of the circulatory system: Secondary | ICD-10-CM | POA: Diagnosis not present

## 2017-04-07 ENCOUNTER — Telehealth: Payer: Self-pay | Admitting: Internal Medicine

## 2017-04-07 MED ORDER — METOPROLOL SUCCINATE ER 25 MG PO TB24: 37.5000 mg | ORAL_TABLET | Freq: Every day | ORAL | 3 refills | Status: DC

## 2017-04-07 NOTE — Telephone Encounter (Signed)
New Message      *STAT* If patient is at the pharmacy, call can be transferred to refill team.   1. Which medications need to be refilled? (please list name of each medication and dose if known)  metoprolol succinate (TOPROL-XL) 25 MG 24 hr tablet Take 1.5 tablets (37.5 mg total) by mouth daily.     2. Which pharmacy/location (including street and city if local pharmacy) is medication to be sent to? [facility]  3. Do they need a 30 day or 90 day supply? [place]

## 2017-04-07 NOTE — Telephone Encounter (Signed)
Pt's medication was sent to pt's pharmacy as requested. Confirmation received.  °

## 2017-04-12 ENCOUNTER — Encounter: Payer: Self-pay | Admitting: Endocrinology

## 2017-04-12 DIAGNOSIS — E039 Hypothyroidism, unspecified: Secondary | ICD-10-CM | POA: Diagnosis not present

## 2017-05-25 ENCOUNTER — Other Ambulatory Visit: Payer: Self-pay | Admitting: Family Medicine

## 2017-05-25 ENCOUNTER — Ambulatory Visit
Admission: RE | Admit: 2017-05-25 | Discharge: 2017-05-25 | Disposition: A | Discharge status: Home / Self Care | Payer: Medicare Other | Source: Ambulatory Visit | Attending: Family Medicine | Admitting: Family Medicine

## 2017-05-25 DIAGNOSIS — R059 Cough, unspecified: Secondary | ICD-10-CM

## 2017-05-25 DIAGNOSIS — I700 Atherosclerosis of aorta: Secondary | ICD-10-CM | POA: Diagnosis not present

## 2017-05-25 DIAGNOSIS — E1149 Type 2 diabetes mellitus with other diabetic neurological complication: Secondary | ICD-10-CM | POA: Diagnosis not present

## 2017-05-25 DIAGNOSIS — R05 Cough: Secondary | ICD-10-CM

## 2017-05-25 DIAGNOSIS — M109 Gout, unspecified: Secondary | ICD-10-CM | POA: Diagnosis not present

## 2017-06-09 ENCOUNTER — Encounter: Payer: Self-pay | Admitting: Internal Medicine

## 2017-06-09 ENCOUNTER — Telehealth: Payer: Self-pay | Admitting: Internal Medicine

## 2017-06-09 NOTE — Telephone Encounter (Signed)
Ok for dental work - will not likely need to hold aspirin unless requested by dentist.  Dr. Lemmie Evens

## 2017-06-09 NOTE — Telephone Encounter (Signed)
Clearance note routed via EPIC

## 2017-06-09 NOTE — Telephone Encounter (Signed)
No SBE prophylaxis needed.  Dr. Lemmie Evens

## 2017-06-09 NOTE — Telephone Encounter (Signed)
Dr. [place] office called in and need in writing from MD a note stating if patient does/does not need premedication for dental procedures.   Routed to Dr. Debara Pickett

## 2017-06-09 NOTE — Telephone Encounter (Signed)
° °  [facility] Pre-operative Risk Assessment    Request for surgical clearance:  1. What type of surgery is being performed? Dental   2. When is this surgery scheduled? Unknown    3. Are there any medications that need to be held prior to surgery and how long? Depends on Dr.hilty   4. Name of physician performing surgery? Dr.David Spong DD  5. What is your office phone and fax number?  Fax [phone]   Randall Wilkins 06/09/2017, 11:40 AM  _________________________________________________________________   (provider comments below)

## 2017-06-10 NOTE — Telephone Encounter (Signed)
Clearance note faxed via Epic.

## 2017-06-15 ENCOUNTER — Telehealth: Payer: Self-pay | Admitting: Endocrinology

## 2017-06-15 NOTE — Telephone Encounter (Signed)
Scheduled np appt Randall Wilkins 07/22/17 @ 2pm. Placed PCP and [name] records in dr tray front office.

## 2017-06-21 DIAGNOSIS — E039 Hypothyroidism, unspecified: Secondary | ICD-10-CM | POA: Diagnosis not present

## 2017-06-22 ENCOUNTER — Other Ambulatory Visit: Payer: Self-pay | Admitting: Internal Medicine

## 2017-06-22 DIAGNOSIS — E119 Type 2 diabetes mellitus without complications: Secondary | ICD-10-CM | POA: Diagnosis not present

## 2017-06-22 DIAGNOSIS — I4891 Unspecified atrial fibrillation: Secondary | ICD-10-CM | POA: Diagnosis not present

## 2017-06-22 DIAGNOSIS — E039 Hypothyroidism, unspecified: Secondary | ICD-10-CM | POA: Diagnosis not present

## 2017-06-22 DIAGNOSIS — I10 Essential (primary) hypertension: Secondary | ICD-10-CM | POA: Diagnosis not present

## 2017-06-22 NOTE — Telephone Encounter (Signed)
New message     *STAT* If patient is at the pharmacy, call can be transferred to refill team.   1. Which medications need to be refilled? (please list name of each medication and dose if known) Lisinorpil 20 mg  2. Which pharmacy/location (including street and city if local pharmacy) is medication to be sent to? [facility]  3. Do they need a 30 day or 90 day supply? 90 day

## 2017-06-23 MED ORDER — LISINOPRIL 5 MG PO TABS: 5.0000 mg | ORAL_TABLET | Freq: Every day | ORAL | 2 refills | Status: DC

## 2017-06-23 NOTE — Telephone Encounter (Signed)
Rx sent to pharmacy, patient notified directly. Rx sent to [facility] in error, called pharmacy to cancel rx. Correct rx sent to [facility].

## 2017-07-07 DIAGNOSIS — G4733 Obstructive sleep apnea (adult) (pediatric): Secondary | ICD-10-CM | POA: Diagnosis not present

## 2017-07-22 ENCOUNTER — Other Ambulatory Visit: Payer: Self-pay

## 2017-07-22 ENCOUNTER — Ambulatory Visit (INDEPENDENT_AMBULATORY_CARE_PROVIDER_SITE_OTHER): Payer: Medicare Other | Admitting: Endocrinology

## 2017-07-22 ENCOUNTER — Encounter: Payer: Self-pay | Admitting: Endocrinology

## 2017-07-22 VITALS — BP 138/68 | HR 63 | Ht 67.0 in | Wt 271.6 lb

## 2017-07-22 DIAGNOSIS — E1165 Type 2 diabetes mellitus with hyperglycemia: Secondary | ICD-10-CM | POA: Diagnosis not present

## 2017-07-22 DIAGNOSIS — Z794 Long term (current) use of insulin: Secondary | ICD-10-CM

## 2017-07-22 DIAGNOSIS — E1142 Type 2 diabetes mellitus with diabetic polyneuropathy: Secondary | ICD-10-CM | POA: Diagnosis not present

## 2017-07-22 LAB — POCT GLYCOSYLATED HEMOGLOBIN (HGB A1C): Hemoglobin A1C: 7.1

## 2017-07-22 MED ORDER — LANTUS SOLOSTAR 100 UNIT/ML ~~LOC~~ SOPN: PEN_INJECTOR | SUBCUTANEOUS | 2 refills | Status: DC

## 2017-07-22 MED ORDER — LIRAGLUTIDE 18 MG/3ML ~~LOC~~ SOPN: 1.2000 mg | PEN_INJECTOR | Freq: Every day | SUBCUTANEOUS | 2 refills | Status: DC

## 2017-07-22 NOTE — Patient Instructions (Signed)
Please find out from insurance company if a One Touch, contour, Freestyle or Accu-Chek brand are covered  Check blood sugars on waking up  daily for now and sometimes before supper  Also check blood sugars about 2 hours after a meal and do this after different meals by rotation  Recommended blood sugar levels on waking up is 90-130 and about 2 hours after meal is 130-160  Please bring your blood sugar monitor or record to each visit, thank you   Start VICTOZA injection as shown once daily at the same time of the day.   Dial the dose to 0.6 mg on the pen for the first week.    You may inject in the stomach, thigh or arm. You may experience nausea in the first few days which usually goes away.  You will feel fullness of the stomach with starting the medication and should try to keep the portions at meals small.   After 1 week increase the dose to 1.2mg  daily if no nausea present.   If any questions or concerns are present call the office or the [facility] helpline at [phone]. Visit [email or web address].info/ for more useful information  INSULIN doses:  Increase the evening LANTUS dose to 40 units and take it around 8 PM Increase the dose every 3 days as directed to get morning sugars below 130, may need to reduce by 4 units also if blood sugars are below 90  STOP Humalog at bedtime  HUMALOG doses at dinnertime will be 30 units.  If blood sugars after evening meal are still over her and 80 increase the dose to 34 units  ALWAYS try to take insulin right before eating  Try to have an egg and toast or another kind of protein with breakfast instead of donuts  Discuss using alternatives to amitriptyline with primary care doctor

## 2017-07-22 NOTE — Progress Notes (Signed)
Patient ID: Randall Wilkins, male   DOB: 04-Jun-1941, 76 y.o.   MRN: [id number]           Reason for Appointment: Consultation for Type 2 Diabetes   History of Present Illness:          Date of diagnosis of type 2 diabetes mellitus:   1998       Background history:   Has been on insulin since about 2001 He thinks he was given metformin only for a month and may have stopped because of side effects, does not remember specifics. Also Actos was tried and not clear what side effects he had Does not remember other medication he may have tried but has not tried Victoza or Byetta  Recent history:   INSULIN regimen is:   Lantus 30 units twice a day at breakfast and bedtime .  Humalog 20-25 units 4 times a day ac-pc     Non-insulin hypoglycemic drugs the patient is taking are: None  His last A1c in March was 8.2 and now is 7.1  Current management, blood sugar patterns and problems identified:  He has been on the same regimen of Lantus and Humalog for quite some time  He thinks he was tried on Toujeo but apparently did not work as well  He takes Lantus in the morning and at bedtime with a pen  He takes Humalog with a syringe since he does not like using pens  He usually takes his insulin at variable times around the meals and sometimes 10-15 minutes later  Currently appears to have relatively good fasting readings but they are higher at bedtime.  Does not check any other time during the day  He has had difficulty with weight loss, previously weighed about 250 pounds He is currently using a Generic monitor and not keeping a record of blood sugars       Side effects from medications have been: Unknown  Compliance with the medical regimen: Fair Hypoglycemia: Only once when he took Humalog and did not eat for an hour    Glucose monitoring:  done 2 times a day         Glucometer:  [facility] brand     Blood Glucose readings by recall:   PREMEAL Breakfast Lunch Dinner Bedtime  Overall    Glucose range: 115-140  ? 200   Median:        Self-care: The diet that the patient has been following is none  Meal times are:  Breakfast is at 8 Lunch: 12 noon Dinner:6   Typical meal intake: Breakfast is usually 3 small donuts, lunch is a sandwich, dinner is meat and vegetables/solid    Snacks: Sugar-free Jell-O or pudding, crackers          Dietician visit, most recent: Several years ago               Exercise:  unable to do any  Weight history: Range 252-280 previously  Wt Readings from Last 3 Encounters:  07/22/17 271 lb 9.6 oz (123.2 kg)  03/04/17 270 lb (122.5 kg)  12/16/16 263 lb 3.2 oz (119.4 kg)    Glycemic control:   Lab Results  Component Value Date   HGBA1C 7.7 (H) 03/29/2015   HGBA1C 7.7 (H) 03/21/2015   Lab Results  Component Value Date   CREATININE 2.01 (H) 04/05/2015   No results found for: MICRALBCREAT  No results found for: FRUCTOSAMINE    Allergies as of 07/22/2017  Reactions   Actos [pioglitazone] Swelling   Metformin And Related Nausea Only   Niaspan [niacin Er] Itching, Rash      Medication List       Accurate as of 07/22/17  4:57 PM. Always use your most recent med list.          allopurinol 300 MG tablet Commonly known as:  ZYLOPRIM Take 1 tablet by mouth daily. Take 1 tab by my mouth daily   amitriptyline 75 MG tablet Commonly known as:  ELAVIL Take 1 tablet by mouth daily.   aspirin EC 81 MG tablet Take 1 tablet (81 mg total) by mouth daily.   colchicine 0.6 MG tablet Take 0.6 mg by mouth daily as needed.   donepezil 10 MG tablet Commonly known as:  ARICEPT TAKE 1 TABLET BY MOUTH DAILY   insulin lispro 100 UNIT/ML injection Commonly known as:  HUMALOG Inject 20-25 Units into the skin 4 (four) times daily. Takes 25u in am, 20u at lunch, 20u in evening and 25u at bedtime   LANTUS SOLOSTAR 100 UNIT/ML Solostar Pen Generic drug:  Insulin Glargine Inject 30 Units into the skin 2 (two) times daily.   levothyroxine  75 MCG tablet Commonly known as:  SYNTHROID, LEVOTHROID   lisinopril 5 MG tablet Commonly known as:  PRINIVIL,ZESTRIL Take 1 tablet (5 mg total) by mouth daily.   metoprolol succinate 25 MG 24 hr tablet Commonly known as:  TOPROL-XL Take 1.5 tablets (37.5 mg total) by mouth daily.   NON FORMULARY Inhale 1 application into the lungs at bedtime. CPAP   potassium chloride SA 20 MEQ tablet Commonly known as:  K-DUR,KLOR-CON Take 20 mEq by mouth daily.   rosuvastatin 20 MG tablet Commonly known as:  CRESTOR Take 20 mg by mouth daily.   tamsulosin 0.4 MG Caps capsule Commonly known as:  FLOMAX Take 0.4 mg by mouth daily.   torsemide 10 MG tablet Commonly known as:  DEMADEX Take 50mg  by mouth alternating with 100mg .       Allergies:  Allergies  Allergen Reactions  . Actos [Pioglitazone] Swelling  . Metformin And Related Nausea Only  . Niaspan [Niacin Er] Itching and Rash    Past Medical History:  Diagnosis Date  . Aortic stenosis, mild 11/14/2013  . Aortic valve sclerosis 03/29/2015  . Bilateral leg edema 05/21/2014  . CAD (coronary artery disease)   . Chronic diastolic congestive heart failure ([place])   . Chronic kidney disease (CKD), stage III (moderate)   . Diabetes 1.5, managed as type 1 ([place]) 02/04/2013  . Diabetic peripheral neuropathy associated with type 1 diabetes mellitus ([place]) 11/14/2013  . Dysphagia   . Dyspnea on exertion 03/21/2015  . Exogenous obesity   . Gout   . Heart attack ([place])   . History of nuclear stress test 07/2011   dipyridamole; fixed inferolateral defect, worse at stress than rest; no reversible ischemia; low risk scan   . Hyperlipidemia   . Hypertension   . Insulin dependent diabetes mellitus ([place])   . Left foot drop   . Left main coronary artery disease 03/28/2015  . Memory loss   . Obesity (BMI 30-39.9) 11/14/2013  . Obstructive sleep apnea 03/21/2015  . OSA on CPAP    uses a cpap  . Peptic ulcer with hemorrhage 03/28/2015  . Peripheral  neuropathy   . Rhabdomyolysis   . 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 Stroke [facility])  L patietal with small scattered lacunar infarcts  . Thrombocytopenia ([place]) 03/21/2015  . Venous insufficiency   . Weakness generalized 03/21/2015    Past Surgical History:  Procedure Laterality Date  . BACK SURGERY  2002   lumbosacral  . Carotid Doppler  03/2013   bilat bulb/prox ICAs - mild amount of fibrous plaque with no evidence of diameter reduction  . CARPAL TUNNEL RELEASE Bilateral 08/09/2014   Procedure: BILATERAL CARPAL TUNNEL RELEASE;  Surgeon: Daryll Brod, MD;  Location: [place];  Service: Orthopedics;  Laterality: Bilateral;  ANESTHESIA:  IV REGIONAL BIL FAB  . CHOLECYSTECTOMY    . COLONOSCOPY    . CORONARY ANGIOPLASTY  10/13/1996  . CORONARY ANGIOPLASTY  09/21/1989   emergency PTCA  . CORONARY ANGIOPLASTY  10/13/1996   Multi-Link diagonal & OD stenting (Dr. Marella Chimes)  . CORONARY ANGIOPLASTY  12/03/1997   disease of mid DX-1 ~50% & in mid PLA & PDA (distal lesions) (Dr. Marella Chimes)   . CORONARY ANGIOPLASTY  10/14/1999   progression of disease distal PLA & PDA; progression of disease prox RCA - moderate (Dr. Marella Chimes)   . CORONARY ANGIOPLASTY WITH STENT PLACEMENT  04/04/2004   4.0x59mm non-DES (thrombectomy via AngioJet) to RCA for high grade stenosis (Dr. Marella Chimes)  . CORONARY ARTERY BYPASS GRAFT N/A 03/29/2015   Procedure: CORONARY ARTERY BYPASS GRAFTING TIMES TWO USING LEFT INTERNAL MAMMARY ARTERY AND RIGHT LEG GREATER SAPHENOUS VEIN HARVESTED ENDOSCOPICALLY.;  Surgeon: Rexene Alberts, MD;  Location: [place];  Service: Open Heart Surgery;  Laterality: N/A;  . ESOPHAGOGASTRODUODENOSCOPY N/A 03/27/2015   Procedure: ESOPHAGOGASTRODUODENOSCOPY (EGD);  Surgeon: Clarene Essex, MD;  Location: [facility] ENDOSCOPY;  Service: Endoscopy;  Laterality: N/A;  possible dilation  . LEFT HEART CATHETERIZATION WITH CORONARY ANGIOGRAM N/A 03/28/2015    Procedure: LEFT HEART CATHETERIZATION WITH CORONARY ANGIOGRAM;  Surgeon: Peter M Martinique, MD;  Location: [place] CATH LAB;  Service: Cardiovascular;  Laterality: N/A;  . ROTATOR CUFF REPAIR Right   . SINUS ENDO W/FUSION    . TONSILLECTOMY    . TRANSTHORACIC ECHOCARDIOGRAM  08/08/2013   EF 55-60%, mild conc hypertrophy, grade 1 diastolic dysfunction; AV with mild stenosis; LA & RA mildly dilated    Family History  Problem Relation Age of Onset  . Heart disease Mother   . Coronary artery disease Father   . Cancer Maternal Grandmother   . Heart Problems Maternal Grandfather     Social History:  reports that he quit smoking about 44 years ago. His smoking use included Cigarettes. He has a 40.00 pack-year smoking history. He has never used smokeless tobacco. He reports that he does not drink alcohol or use drugs.   Review of Systems  Constitutional: Negative for weight loss.  HENT: Negative for trouble swallowing.   Eyes: Negative for blurred vision.  Respiratory: Negative for shortness of breath.   Cardiovascular: Positive for leg swelling. Negative for palpitations.  Gastrointestinal: Negative for diarrhea.  Endocrine: Positive for fatigue and cold intolerance.       Mild hypothyroidism probably with baseline TSH 10 in 2016.  He has not felt any different with thyroxine supplementation including with increase in his dosage this year.  Most recent TSH 2.3 done in 7/18 by PCP  Musculoskeletal: Negative for joint pain.  Skin: Negative for rash.  Neurological: Positive for weakness, numbness and tingling.       Memory issues present Burning and tingling in his feet present, controlled with amitriptyline.  Reportedly gabapentin not effective He  has weakness in his left leg especially lower part after episode of rhabdomyolysis     Lipid history: Last LDL was 54 in March 2018, currently on 20 mg rosuvastatin   No results found for: CHOL, HDL, LDLCALC, LDLDIRECT, TRIG, CHOLHDL           Hypertension: Relatively mild and controlled with the 5 mg lisinopril and metoprolol  Most recent eye exam was in 2017, Dr. Gershon Crane and reportedly no retinopathy  Most recent foot exam: 8/18    LABS:  No visits with results within 1 Week(s) from this visit.  Latest known visit with results is:  Hospital Outpatient Visit on 06/28/2015  Component Date Value Ref Range Status  . Glucose-Capillary 06/28/2015 126* 65 - 99 mg/dL Final  . Glucose-Capillary 06/28/2015 137* 65 - 99 mg/dL Final    Physical Examination:  BP 138/68   Pulse 63   Ht 5\' 7"  (1.702 m)   Wt 271 lb 9.6 oz (123.2 kg)   SpO2 97%   BMI 42.54 kg/m   GENERAL:         Patient has generalized obesity.   HEENT:         Eye exam shows normal external appearance. Fundus exam shows no retinopathy, Exam limited by small pupils.  Oral exam shows normal mucosa .  NECK:   There is no lymphadenopathy Thyroid is not enlarged and no nodules felt.  Carotids are normal to palpation, and right carotid bruit/transmitted murmur heard  LUNGS:         Chest is symmetrical. Lungs are clear to auscultation.Marland Kitchen   HEART:         Heart sounds:  S1 and S2 are normal.  Long ejection systolic murmur heard all over the precordium and basis No  S3 or S4.   ABDOMEN:   There is no distention present. Liver and spleen are not palpable.  No other mass or tenderness present.   NEUROLOGICAL:   Ankle jerks are absent bilaterally.    Diabetic Foot Exam - Simple   Simple Foot Form Diabetic Foot exam was performed with the following findings:  Yes 07/22/2017  2:56 PM  Visual Inspection See comments:  Yes Sensation Testing See comments:  Yes Pulse Check See comments:  Yes Comments Lower leg and pedal edema present especially on the left with stasis pigmentation Some flattening of the arches present Monofilament sensation absent in distal feet bilaterally Pedal pulses not palpable on the left            Vibration sense is Moderately reduced in  distal first toes. MUSCULOSKELETAL:  There is no swelling or deformity of the peripheral joints. Spine is normal to inspection.   EXTREMITIES:     There is 2-3+ lower leg edema, more on the left.   SKIN:       No rash or lesions of concern.        ASSESSMENT:  Diabetes type 2, uncontrolled with marked obesity  Currently patient is only on insulin with using relatively large doses of Humalog 4 times a day along with Lantus twice a day Although he has had A1c readings mostly over 7.5% it is relatively lower at 7.1 today This is despite his having fairly consistently high postprandial readings at dinner He is not checking his blood sugars after breakfast or lunch Meals are not controlled in portion size and he also has a mostly carbohydrate breakfast in the morning  Currently not able to exercise because of neuropathy and left  leg weakness   Complications of diabetes: Mark peripheral neuropathy with sensory loss.  Renal dysfunction, relatively mild.  Unknown status of his urine microalbumin.  Apparently followed by nephrologist  Cardiac problems: He has chronic diastolic heart failure, aortic sclerosis and history of coronary artery disease  LIPIDS: LDL has been below 70, on high intensity statin  HYPOTHYROIDISM: Mild and probably asymptomatic, managed by PCP with last TSH 2.3 on current regimen of 75 g levothyroxine  PLAN:    See patient instructions  He will need to stop taking Humalog at bedtime to avoid potential nocturnal hypoglycemia  To improve his postprandial readings after supper he will need to increase his Humalog by at least 5 units at dinnertime  Since he thinks that his fasting readings are high without Humalog at bedtime he will increase his evening Lantus by 10 units for now  Given him a flowsheet with instructions for adjusting his evening Lantus every 3 days by 2 units to keep his blood sugars around 130 or below.  He can also take his Lantus earlier at about 8  PM, 12 hours after his morning dose  He is a good candidate for [place] but this appears to be not covered by his insurance  Since he does need to lose weight significantly he is a good candidate for a GLP-1 drug like Victoza Discussed with the patient the nature of GLP-1 drugs, the actions on various organ systems, how they benefit blood glucose control, as well as the benefit of weight loss and  increase satiety . Explained possible side effects especially nausea and vomiting initially; discussed safety information in package insert.  Described the injection technique and dosage titration of VICTOZA  starting with 0.6 mg once a day at the same time for the first week and then increasing to 1.2 mg if no symptoms of nausea.  Educational brochure on Victoza given  He will start checking his blood sugars more consistently after eating but also as do some readings after breakfast or lunch  He will need to modify his diet with cutting back on high glycemic index foods like donuts  Will benefit from consultation with the diabetes educator which will be scheduled in about 10 days  Consider using the Humalog pen for convenience at mealtimes  Follow-up in 4 weeks  Patient Instructions  Please find out from insurance company if a One Touch, contour, Freestyle or Accu-Chek brand are covered  Check blood sugars on waking up  daily for now and sometimes before supper  Also check blood sugars about 2 hours after a meal and do this after different meals by rotation  Recommended blood sugar levels on waking up is 90-130 and about 2 hours after meal is 130-160  Please bring your blood sugar monitor or record to each visit, thank you   Start VICTOZA injection as shown once daily at the same time of the day.   Dial the dose to 0.6 mg on the pen for the first week.    You may inject in the stomach, thigh or arm. You may experience nausea in the first few days which usually goes away.  You will feel  fullness of the stomach with starting the medication and should try to keep the portions at meals small.   After 1 week increase the dose to 1.2mg  daily if no nausea present.   If any questions or concerns are present call the office or the [place] helpline at [phone]. Visit http://www.wall.info/ for more  useful information  INSULIN doses:  Increase the evening LANTUS dose to 40 units and take it around 8 PM Increase the dose every 3 days as directed to get morning sugars below 130, may need to reduce by 4 units also if blood sugars are below 90  STOP Humalog at bedtime  HUMALOG doses at dinnertime will be 30 units.  If blood sugars after evening meal are still over her and 80 increase the dose to 34 units  ALWAYS try to take insulin right before eating  Try to have an egg and toast or another kind of protein with breakfast instead of donuts  Discuss using alternatives to amitriptyline with primary care doctor    Total visit time for evaluation and management of multiple problems in counseling = 60 minutes  Consultation note has been sent to the referring physician  [facility] 07/22/2017, 4:57 PM   Note: This office note was prepared with Dragon voice recognition system technology. Any transcriptional errors that result from this process are unintentional.

## 2017-07-23 ENCOUNTER — Telehealth: Payer: Self-pay | Admitting: Endocrinology

## 2017-07-23 NOTE — Telephone Encounter (Signed)
Patient calling to confirm that his insurance will cover One Touch, Accu Chek, Contour, and Freestyle blood sugar monitoring devices.  Please advise.   Ty,  -LL

## 2017-07-23 NOTE — Telephone Encounter (Signed)
Please advise which one you prefer for patient to have.

## 2017-07-25 NOTE — Telephone Encounter (Signed)
Please send prescription for Contour next meter and test strips, checking 3 times a day

## 2017-07-26 ENCOUNTER — Other Ambulatory Visit: Payer: Self-pay

## 2017-07-26 MED ORDER — GLUCOSE BLOOD VI STRP: ORAL_STRIP | 3 refills | Status: DC

## 2017-07-26 MED ORDER — CONTOUR NEXT MONITOR W/DEVICE KIT: 1.0000 | PACK | Freq: Three times a day (TID) | 2 refills | Status: DC

## 2017-07-26 NOTE — Telephone Encounter (Signed)
Called and spoke to wife of patient and let her know that I am sending in his meter and test strips to the [facility].

## 2017-07-27 ENCOUNTER — Other Ambulatory Visit: Payer: Self-pay

## 2017-07-27 MED ORDER — GLUCOSE BLOOD VI STRP: ORAL_STRIP | 3 refills | Status: DC

## 2017-07-27 MED ORDER — CONTOUR NEXT MONITOR W/DEVICE KIT: 1.0000 | PACK | Freq: Three times a day (TID) | 2 refills | Status: DC

## 2017-07-27 NOTE — Telephone Encounter (Signed)
Malcolm found out they are not listed for coverage on the patient's insurance.  Please resend the rx for the meter and test strips to [place], [place] - [address]  Please update patient also.

## 2017-07-27 NOTE — Telephone Encounter (Signed)
Called patient and spoke to his wife and let her know that I have sent over the blood glucose meter and test strips to the [facility] at [facility].

## 2017-07-28 DIAGNOSIS — M5136 Other intervertebral disc degeneration, lumbar region: Secondary | ICD-10-CM | POA: Diagnosis not present

## 2017-07-28 DIAGNOSIS — M150 Primary generalized (osteo)arthritis: Secondary | ICD-10-CM | POA: Diagnosis not present

## 2017-07-28 DIAGNOSIS — E039 Hypothyroidism, unspecified: Secondary | ICD-10-CM | POA: Diagnosis not present

## 2017-07-28 DIAGNOSIS — E119 Type 2 diabetes mellitus without complications: Secondary | ICD-10-CM | POA: Diagnosis not present

## 2017-07-28 DIAGNOSIS — I10 Essential (primary) hypertension: Secondary | ICD-10-CM | POA: Diagnosis not present

## 2017-07-28 DIAGNOSIS — I4891 Unspecified atrial fibrillation: Secondary | ICD-10-CM | POA: Diagnosis not present

## 2017-07-28 DIAGNOSIS — M1A09X0 Idiopathic chronic gout, multiple sites, without tophus (tophi): Secondary | ICD-10-CM | POA: Diagnosis not present

## 2017-07-28 DIAGNOSIS — Z6837 Body mass index (BMI) 37.0-37.9, adult: Secondary | ICD-10-CM | POA: Diagnosis not present

## 2017-07-29 ENCOUNTER — Other Ambulatory Visit: Payer: Self-pay

## 2017-07-29 MED ORDER — GLUCOSE BLOOD VI STRP: ORAL_STRIP | 3 refills | Status: DC

## 2017-07-29 MED ORDER — CONTOUR NEXT MONITOR W/DEVICE KIT: 1.0000 | PACK | Freq: Three times a day (TID) | 2 refills | Status: DC

## 2017-07-29 NOTE — Telephone Encounter (Signed)
Patient has basic Medicare and script needs a diagnosis code to be filled. Please advise.   Ty,  -LL

## 2017-07-29 NOTE — Telephone Encounter (Signed)
Called patient and spoke to Randall Wilkins and let her know that the diagnosis code is on both prescriptions and she should be able to receive now.

## 2017-07-30 ENCOUNTER — Encounter: Payer: Self-pay | Admitting: Endocrinology

## 2017-07-30 DIAGNOSIS — E669 Obesity, unspecified: Secondary | ICD-10-CM | POA: Diagnosis not present

## 2017-07-30 DIAGNOSIS — E8770 Fluid overload, unspecified: Secondary | ICD-10-CM | POA: Diagnosis not present

## 2017-07-30 DIAGNOSIS — E139 Other specified diabetes mellitus without complications: Secondary | ICD-10-CM | POA: Diagnosis not present

## 2017-07-30 DIAGNOSIS — N183 Chronic kidney disease, stage 3 (moderate): Secondary | ICD-10-CM | POA: Diagnosis not present

## 2017-07-30 DIAGNOSIS — I509 Heart failure, unspecified: Secondary | ICD-10-CM | POA: Diagnosis not present

## 2017-08-09 ENCOUNTER — Encounter: Payer: Medicare Other | Attending: Endocrinology | Admitting: Nutrition

## 2017-08-09 ENCOUNTER — Other Ambulatory Visit (INDEPENDENT_AMBULATORY_CARE_PROVIDER_SITE_OTHER): Payer: Medicare Other

## 2017-08-09 ENCOUNTER — Other Ambulatory Visit: Payer: Self-pay

## 2017-08-09 DIAGNOSIS — Z794 Long term (current) use of insulin: Secondary | ICD-10-CM | POA: Diagnosis not present

## 2017-08-09 DIAGNOSIS — E1165 Type 2 diabetes mellitus with hyperglycemia: Secondary | ICD-10-CM | POA: Diagnosis not present

## 2017-08-09 DIAGNOSIS — E1042 Type 1 diabetes mellitus with diabetic polyneuropathy: Secondary | ICD-10-CM

## 2017-08-09 DIAGNOSIS — Z713 Dietary counseling and surveillance: Secondary | ICD-10-CM | POA: Diagnosis not present

## 2017-08-09 LAB — BASIC METABOLIC PANEL
BUN: 24 mg/dL — ABNORMAL HIGH (ref 6–23)
CO2: 35 mEq/L — ABNORMAL HIGH (ref 19–32)
Calcium: 9.8 mg/dL (ref 8.4–10.5)
Chloride: 101 mEq/L (ref 96–112)
Creatinine, Ser: 1.4 mg/dL (ref 0.40–1.50)
GFR: 52.29 mL/min — ABNORMAL LOW (ref >60.00)
Glucose, Bld: 152 mg/dL — ABNORMAL HIGH (ref 70–99)
Potassium: 3.6 mEq/L (ref 3.5–5.1)
Sodium: 142 mEq/L (ref 135–145)

## 2017-08-09 MED ORDER — GLUCOSE BLOOD VI STRP: ORAL_STRIP | 3 refills | Status: DC

## 2017-08-12 ENCOUNTER — Ambulatory Visit (INDEPENDENT_AMBULATORY_CARE_PROVIDER_SITE_OTHER): Payer: Medicare Other | Admitting: Endocrinology

## 2017-08-12 ENCOUNTER — Encounter: Payer: Self-pay | Admitting: Endocrinology

## 2017-08-12 VITALS — BP 136/74 | HR 67 | Ht 67.0 in | Wt 264.0 lb

## 2017-08-12 DIAGNOSIS — E1165 Type 2 diabetes mellitus with hyperglycemia: Secondary | ICD-10-CM | POA: Diagnosis not present

## 2017-08-12 DIAGNOSIS — Z794 Long term (current) use of insulin: Secondary | ICD-10-CM | POA: Diagnosis not present

## 2017-08-12 LAB — FRUCTOSAMINE: Fructosamine: 233 umol/L (ref 0–285)

## 2017-08-12 NOTE — Progress Notes (Signed)
Pt. Is here with his wife to get new meter and to review blood sugars.  He did not bring his meter.  Says FBSs are now around 120-150. Insulin:   Lantus:  AM: 30 ,    PM: 42 Humalog dose  30/30/40.. Typical day: 8:15: takes Lantus with coffeee that has cream and Sweet and low 8:30 Humalog dose: 30u, 1 egg, 1 toast with jelly,1 bacon, 1 glass of milk (8oz),  Sometimes sweetened cereal, sometimes just toast with peanut butter. 12-1 PM: sandwich with 45 calorie bread, 2 cookies (oreo, or choc. Chip), and diet coke 5:30 Protein of some kind-4-6 ounces likes baked fish, ususally no potatoes, but has other carbs like peas and corn,  Slaw or other non starchy veg..  No bread 8-9PM: 2X/wk: fruit, or sugar free jello with fruit  He reports no difficulty in understanding how to adjust the Humalog dose acS, if high at HS.    Says is taking Victoza, and has increased the dose to 1.2.  Says nausea is less now, but will happen occasionally for about 20-30 min.  He admits he is eating less, especially at meal time, and also after supper.  Wife is helpful with this.  Appears very knowledgeable with carb portions. Says used to eat a lot of sweets like donuts for breakfast, but has stopped this "for the most part". Activity: None  Suggestions: 1.  If eating cereal, add some protein.  Does not like cheese, so suggested peanut butter, and to increase the Humalog dose by 5u before eating this.  2.  Gave him 2 Contour Next meters, because he did not like the small one he had.  He was having difficulty reading the small numbers.   An extra meter to keep at his [place] because he often forgets it, when he leaves the house.   3.  Suggested the idea of adjusting the Humalog dose upward/downward by 2-5u when eating more carbs before the meal, or when active after the meal-like when he goes to the [place] and must cut grass, and clean the first day they get there.  His blood sugar usually drops in the afternoon on  these days.

## 2017-08-12 NOTE — Progress Notes (Signed)
Patient ID: Randall Wilkins, male   DOB: 1941-04-25, 76 y.o.   MRN: [id number]           Reason for Appointment: Follow-up for Type 2 Diabetes   History of Present Illness:          Date of diagnosis of type 2 diabetes mellitus:   1998       Background history:   Has been on insulin since about 2001 He thinks he was given metformin only for a month and may have stopped because of side effects, does not remember specifics. Also Actos was tried and not clear what side effects he had Does not remember other medication he may have tried but has not tried Victoza or Byetta   Recent history:   INSULIN regimen is:   Lantus 30 at breakfast and 42 units at around 8 PM .  Humalog 0-15 units 4 times a day ac-pc     Non-insulin hypoglycemic drugs the patient is taking are: Victoza 1.2 mg daily  His last A1c was 7.1 Fructosamine is 233  Current management, blood sugar patterns and problems identified:  He has been on VICTOZA since his initial consultation earlier this month  He had some nausea in the first week but he apparently has had no difficulty with 1.2 mg daily  HUMALOG insulin was stopped at bedtime  Although he was told to continue his Humalog at meals and take more at suppertime he has actually cut back on his Humalog since he felt his sugars to be lower and he was able to cut back on portions with starting Victoza  He thinks he had a low sugar of 47 about 11 AM and not clear why, otherwise no low sugars  He now takes HUMALOG only when his blood sugar is relatively high before eating but not based on what he is planning to eat  LANTUS was increased in the evening on his last visit but his fasting readings are still mildly increased  He has mostly not taking any Humalog at suppertime because of low normal sugars around 90+ recently, last night had 2 small servings of carbohydrate but blood sugar did not go up and was 101 at bedtime  Overall postprandial readings appear to be  better also  Only in the last 4 days he has had readings on his contour meter        Side effects from medications have been: Unknown  Compliance with the medical regimen: Fair  Glucose monitoring:  done 3-4 times a day         Glucometer:  contour     Blood Glucose readings by download off  Mean values apply above for all meters except median for One Touch  PRE-MEAL Fasting Lunch Dinner Bedtime Overall  Glucose range:       Average 165 133   94   129       Self-care: The diet that the patient has been following is none  Meal times are:  Breakfast is at 8 Lunch: 12 noon Dinner:6   Typical meal intake: Breakfast is usually cereal/PB toast  lunch is a sandwich, dinner is meat and vegetables/solid    Snacks: Sugar-free Jell-O or pudding, crackers          Dietician visit, most recent: Several years ago               CDE 8/18 Exercise:  unable to do any  Weight history: Range 252-280 previously  Wt Readings  from Last 3 Encounters:  08/12/17 264 lb (119.7 kg)  07/22/17 271 lb 9.6 oz (123.2 kg)  03/04/17 270 lb (122.5 kg)    Glycemic control:   Lab Results  Component Value Date   HGBA1C 7.1 07/22/2017   HGBA1C 7.7 (H) 03/29/2015   HGBA1C 7.7 (H) 03/21/2015   Lab Results  Component Value Date   CREATININE 1.40 08/09/2017   No results found for: MICRALBCREAT  No results found for: FRUCTOSAMINE    Allergies as of 08/12/2017      Reactions   Actos [pioglitazone] Swelling   Metformin And Related Nausea Only   Niaspan [niacin Er] Itching, Rash      Medication List       Accurate as of 08/12/17 11:49 AM. Always use your most recent med list.          allopurinol 300 MG tablet Commonly known as:  ZYLOPRIM Take 1 tablet by mouth daily. Take 1 tab by my mouth daily   amitriptyline 75 MG tablet Commonly known as:  ELAVIL Take 1 tablet by mouth daily.   aspirin EC 81 MG tablet Take 1 tablet (81 mg total) by mouth daily.   colchicine 0.6 MG tablet Take  0.6 mg by mouth daily as needed.   CONTOUR NEXT MONITOR w/Device Kit 1 Device by Does not apply route 3 (three) times daily. Use to check blood sugars 3 times daily. Dx Code E13.9   donepezil 10 MG tablet Commonly known as:  ARICEPT TAKE 1 TABLET BY MOUTH DAILY   glucose blood test strip Commonly known as:  CONTOUR NEXT TEST Use to check blood sugars 4 times daily. Dx Code E13.9   insulin lispro 100 UNIT/ML injection Commonly known as:  HUMALOG Inject 20-25 Units into the skin 4 (four) times daily. Takes 15u in am, 20u at lunch, 20u in evening and 25u at bedtime   LANTUS SOLOSTAR 100 UNIT/ML Solostar Pen Generic drug:  Insulin Glargine Inject 30 units in the morning and inject 40 units in the evening   levothyroxine 75 MCG tablet Commonly known as:  SYNTHROID, LEVOTHROID   liraglutide 18 MG/3ML Sopn Commonly known as:  VICTOZA Inject 0.2 mLs (1.2 mg total) into the skin daily.   lisinopril 5 MG tablet Commonly known as:  PRINIVIL,ZESTRIL Take 1 tablet (5 mg total) by mouth daily.   metoprolol succinate 25 MG 24 hr tablet Commonly known as:  TOPROL-XL Take 1.5 tablets (37.5 mg total) by mouth daily.   NON FORMULARY Inhale 1 application into the lungs at bedtime. CPAP   potassium chloride SA 20 MEQ tablet Commonly known as:  K-DUR,KLOR-CON Take 20 mEq by mouth daily.   rosuvastatin 20 MG tablet Commonly known as:  CRESTOR Take 20 mg by mouth daily.   tamsulosin 0.4 MG Caps capsule Commonly known as:  FLOMAX Take 0.4 mg by mouth daily.   torsemide 10 MG tablet Commonly known as:  DEMADEX Take 27m by mouth alternating with 1040m       Allergies:  Allergies  Allergen Reactions  . Actos [Pioglitazone] Swelling  . Metformin And Related Nausea Only  . Niaspan [Niacin Er] Itching and Rash    Past Medical History:  Diagnosis Date  . Aortic stenosis, mild 11/14/2013  . Aortic valve sclerosis 03/29/2015  . Bilateral leg edema 05/21/2014  . CAD (coronary artery  disease)   . [place]ive [place]  . Chronic kidney disease (CKD), stage III (moderate)   . Diabetes 1.5,  managed as type 1 ([place]) 02/04/2013  . Diabetic peripheral neuropathy associated with type 1 diabetes mellitus ([place]) 11/14/2013  . Dysphagia   . Dyspnea on exertion 03/21/2015  . Exogenous obesity   . Gout   . Heart attack ([place])   . History of nuclear stress test 07/2011   dipyridamole; fixed inferolateral defect, worse at stress than rest; no reversible ischemia; low risk scan   . Hyperlipidemia   . Hypertension   . Insulin dependent diabetes mellitus ([place])   . Left foot drop   . Left main coronary artery disease 03/28/2015  . Memory loss   . Obesity (BMI 30-39.9) 11/14/2013  . Obstructive sleep apnea 03/21/2015  . OSA on CPAP    uses a cpap  . Peptic ulcer with hemorrhage 03/28/2015  . Peripheral neuropathy   . Rhabdomyolysis   . 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 Stroke ([place])    L patietal with small scattered lacunar infarcts  . Thrombocytopenia ([place]) 03/21/2015  . Venous insufficiency   . Weakness generalized 03/21/2015    Past Surgical History:  Procedure Laterality Date  . BACK SURGERY  2002   lumbosacral  . Carotid Doppler  03/2013   bilat bulb/prox ICAs - mild amount of fibrous plaque with no evidence of diameter reduction  . CARPAL TUNNEL RELEASE Bilateral 08/09/2014   Procedure: BILATERAL CARPAL TUNNEL RELEASE;  Surgeon: Daryll Brod, MD;  Location: [facility];  Service: Orthopedics;  Laterality: Bilateral;  ANESTHESIA:  IV REGIONAL BIL FAB  . CHOLECYSTECTOMY    . COLONOSCOPY    . CORONARY ANGIOPLASTY  10/13/1996  . CORONARY ANGIOPLASTY  09/21/1989   emergency PTCA  . CORONARY ANGIOPLASTY  10/13/1996   Multi-Link diagonal & OD stenting (Dr. Marella Chimes)  . CORONARY ANGIOPLASTY  12/03/1997   disease of mid DX-1 ~50% & in mid PLA & PDA (distal lesions) (Dr. Marella Chimes)   . CORONARY  ANGIOPLASTY  10/14/1999   progression of disease distal PLA & PDA; progression of disease prox RCA - moderate (Dr. Marella Chimes)   . CORONARY ANGIOPLASTY WITH STENT PLACEMENT  04/04/2004   4.0x67m non-DES (thrombectomy via AngioJet) to RCA for high grade stenosis (Dr. RMarella Chimes  . CORONARY ARTERY BYPASS GRAFT N/A 03/29/2015   Procedure: CORONARY ARTERY BYPASS GRAFTING TIMES TWO USING LEFT INTERNAL MAMMARY ARTERY AND RIGHT LEG GREATER SAPHENOUS VEIN HARVESTED ENDOSCOPICALLY.;  Surgeon: CRexene Alberts MD;  [facility]  Service: Open Heart Surgery;  Laterality: N/A;  . ESOPHAGOGASTRODUODENOSCOPY N/A 03/27/2015   Procedure: ESOPHAGOGASTRODUODENOSCOPY (EGD);  Surgeon: MClarene Essex MD;  [facility];  Service: Endoscopy;  Laterality: N/A;  possible dilation  . LEFT HEART CATHETERIZATION WITH CORONARY ANGIOGRAM N/A 03/28/2015   Procedure: LEFT HEART CATHETERIZATION WITH CORONARY ANGIOGRAM;  Surgeon: Peter M JMartinique MD;  [facility];  Service: Cardiovascular;  Laterality: N/A;  . ROTATOR CUFF REPAIR Right   . SINUS ENDO W/FUSION    . TONSILLECTOMY    . TRANSTHORACIC ECHOCARDIOGRAM  08/08/2013   EF 55-60%, mild conc hypertrophy, grade 1 diastolic dysfunction; AV with mild stenosis; LA & RA mildly dilated    Family History  Problem Relation Age of Onset  . Heart disease Mother   . Coronary artery disease Father   . Cancer Maternal Grandmother   . Heart Problems Maternal Grandfather     Social History:  reports that he quit smoking about 44 years ago.  His smoking use included Cigarettes. He has a 40.00 pack-year smoking history. He has never used smokeless tobacco. He reports that he does not drink alcohol or use drugs.   Review of Systems   Lipid history: Last LDL was 54 in March 2018, currently on 20 mg rosuvastatin   No results found for: CHOL, HDL, LDLCALC, LDLDIRECT, TRIG, CHOLHDL         Hypertension: Relatively mild and controlled with the 5 mg lisinopril and  metoprolol  Previously was having significant edema that was persistent, this is better now  Most recent eye exam was in 2017, Dr. Gershon Crane and reportedly no retinopathy  Most recent foot exam: 5/83  Complications of diabetes: Mark peripheral neuropathy with sensory loss.   LABS:  Lab on 08/09/2017  Component Date Value Ref Range Status  . Sodium 08/09/2017 142  135 - 145 mEq/L Final  . Potassium 08/09/2017 3.6  3.5 - 5.1 mEq/L Final  . Chloride 08/09/2017 101  96 - 112 mEq/L Final  . CO2 08/09/2017 35* 19 - 32 mEq/L Final  . Glucose, Bld 08/09/2017 152* 70 - 99 mg/dL Final  . BUN 08/09/2017 24* 6 - 23 mg/dL Final  . Creatinine, Ser 08/09/2017 1.40  0.40 - 1.50 mg/dL Final  . Calcium 08/09/2017 9.8  8.4 - 10.5 mg/dL Final  . GFR 08/09/2017 52.29* >60.00 mL/min Final    Physical Examination:  BP 136/74   Pulse 67   Ht '5\' 7"'  (1.702 m)   Wt 264 lb (119.7 kg)   SpO2 96%   BMI 41.35 kg/m   No edema of legs      ASSESSMENT:  Diabetes type 2, uncontrolled with marked obesity  See history of present illness for detailed discussion of current diabetes management, blood sugar patterns and problems identified  He is doing much better with his diabetes control with adding Victoza He has considerably reduced his dose of HUMALOG Also is starting to lose weight partly because of reduced edemathe Fasting readings are around 160 recently even with increasing his evening LANTUS Postprandial readings are excellent even when he has some carbohydrate Currently taking Humalog only sporadically and he thinks this is mostly when his blood sugars are high  PLAN:    He will stay on the current regimen of Humalog small doses Up to 15 units for higher carbohydrate meals  Reduce Lantus by 4 units in the morning and increase it by 2 units at night  He will continue to check his blood sugars with his new monitor but he can reduce the frequency of monitoring but continued to rotate monitoring  times  Continued to have some protein at breakfast consistently  Continue Victoza 1.2 mg daily   Patient Instructions  Check blood sugars on waking up  4/7 days  Also check blood sugars about 2 hours after a meal and do this after different meals by rotation  Recommended blood sugar levels on waking up is 90-130 and about 2 hours after meal is 130-180  Please bring your blood sugar monitor to each visit, thank you  L[facility]us 26 units in am and 44 [facility] 08/12/2017, 11:49 AM   Note: This office note was prepared with Estate agent. Any transcriptional errors that result from this process are unintentional.

## 2017-08-12 NOTE — Patient Instructions (Signed)
IF eating Honey nut Chereos add 5extra units to the breakfast Humalog dose, and eat 2T of peanut butter, or other protein with this. Call blood sugar readings next week to me.

## 2017-08-12 NOTE — Patient Instructions (Signed)
Check blood sugars on waking up  4/7 days  Also check blood sugars about 2 hours after a meal and do this after different meals by rotation  Recommended blood sugar levels on waking up is 90-130 and about 2 hours after meal is 130-180  Please bring your blood sugar monitor to each visit, thank you  Lantus 26 units in am and 44 at nite

## 2017-08-18 ENCOUNTER — Telehealth: Payer: Self-pay | Admitting: Endocrinology

## 2017-08-18 NOTE — Telephone Encounter (Signed)
MEDICATION: glucose blood (CONTOUR NEXT TEST) test strip  PHARMACY: [facility] [place] [place] - [address]    IS THIS A 90 DAY SUPPLY : yes  IS PATIENT OUT OF MEDICATION: yes  IF NOT; HOW MUCH IS LEFT: n/a  LAST APPOINTMENT DATE:08/09/17  NEXT APPOINTMENT DATE:10/18/17  OTHER COMMENTS: Patient states that the [facility] [address] needed addiitional information so send to the pharmacy above.   **Let patient know to contact pharmacy at the end of the day to make sure medication is ready. **  ** Please notify patient to allow 48-72 hours to process**  **Encourage patient to contact the pharmacy for refills or they can request refills through [facility]**

## 2017-08-19 ENCOUNTER — Other Ambulatory Visit: Payer: Self-pay

## 2017-08-19 DIAGNOSIS — E139 Other specified diabetes mellitus without complications: Secondary | ICD-10-CM | POA: Diagnosis not present

## 2017-08-19 MED ORDER — GLUCOSE BLOOD VI STRP: ORAL_STRIP | 3 refills | Status: DC

## 2017-08-19 NOTE — Telephone Encounter (Signed)
Called Mickel Baas and let her know that I have sent in a prescription for his Contour test strips to the [facility] on [address] in [place].

## 2017-09-06 DIAGNOSIS — Z23 Encounter for immunization: Secondary | ICD-10-CM | POA: Diagnosis not present

## 2017-10-15 ENCOUNTER — Other Ambulatory Visit (INDEPENDENT_AMBULATORY_CARE_PROVIDER_SITE_OTHER): Payer: Medicare Other

## 2017-10-15 DIAGNOSIS — Z794 Long term (current) use of insulin: Secondary | ICD-10-CM | POA: Diagnosis not present

## 2017-10-15 DIAGNOSIS — E1165 Type 2 diabetes mellitus with hyperglycemia: Secondary | ICD-10-CM

## 2017-10-15 LAB — BASIC METABOLIC PANEL
BUN: 22 mg/dL (ref 6–23)
CO2: 34 mEq/L — ABNORMAL HIGH (ref 19–32)
Calcium: 9.1 mg/dL (ref 8.4–10.5)
Chloride: 100 mEq/L (ref 96–112)
Creatinine, Ser: 1.45 mg/dL (ref 0.40–1.50)
GFR: 50.19 mL/min — ABNORMAL LOW (ref >60.00)
Glucose, Bld: 100 mg/dL — ABNORMAL HIGH (ref 70–99)
Potassium: 3.3 mEq/L — ABNORMAL LOW (ref 3.5–5.1)
Sodium: 141 mEq/L (ref 135–145)

## 2017-10-15 LAB — HEMOGLOBIN A1C: Hgb A1c MFr Bld: 7.4 % — ABNORMAL HIGH (ref 4.6–6.5)

## 2017-10-18 ENCOUNTER — Encounter: Payer: Self-pay | Admitting: Endocrinology

## 2017-10-18 ENCOUNTER — Ambulatory Visit: Payer: Medicare Other | Admitting: Endocrinology

## 2017-10-18 VITALS — BP 128/78 | HR 66 | Ht 67.0 in | Wt 267.8 lb

## 2017-10-18 DIAGNOSIS — Z794 Long term (current) use of insulin: Secondary | ICD-10-CM

## 2017-10-18 DIAGNOSIS — E1165 Type 2 diabetes mellitus with hyperglycemia: Secondary | ICD-10-CM | POA: Diagnosis not present

## 2017-10-18 MED ORDER — LIRAGLUTIDE 18 MG/3ML ~~LOC~~ SOPN: 1.2000 mg | PEN_INJECTOR | Freq: Every day | SUBCUTANEOUS | 2 refills | Status: DC

## 2017-10-18 NOTE — Patient Instructions (Addendum)
Check blood sugars on waking up  4-5/7  Also check blood sugars about 2 hours after a meal and do this after different meals by rotation  Recommended blood sugar levels on waking up is 90-130 and about 2 hours after meal is 130-160  Please bring your blood sugar monitor to each visit, thank you  Take 48 Lantus at nite  Call before refilling it  Victoza add 5 clicks then 1.2mg 

## 2017-10-18 NOTE — Progress Notes (Signed)
Patient ID: Randall Wilkins, male   DOB: Sep 01, 1941, 76 y.o.   MRN: [id number]           Reason for Appointment: Follow-up for Type 2 Diabetes   History of Present Illness:          Date of diagnosis of type 2 diabetes mellitus:   1998       Background history:   Has been on insulin since about 2001 He thinks he was given metformin only for a month and may have stopped because of side effects, does not remember specifics. Also Actos was tried and not clear what side effects he had Does not remember other medication he may have tried but has not tried Victoza or Byetta   Recent history:   INSULIN regimen is:   Lantus 26 at breakfast and 44 units at around 8 PM .  Humalog 25-30 units 3 times a day ac-pc     Non-insulin hypoglycemic drugs the patient is taking are: Victoza 0.6 mg daily  His last A1c is 7.4, previously 7.1 but has been as high as 8.1 this year  Current management, blood sugar patterns and problems identified:  He has been on VICTOZA since his initial consultation earlier this month  He had significant amount of nausea abruptly sometime last month for 3 days and he stopped taking it and is only taking 0.6 mg now without notifying us  HUMALOG insulin has been continued at mealtimes and he is back up to the relatively higher dose he was taking; before this when he was taking Victoza 1.2 mg he was taking minimal doses  LANTUS is still being taken twice a day  He says that his FASTING blood sugars are still high but he has not gone up and the dose as directed  Did not bring his monitor today and not clear what his blood sugar patterns are  He does not think that his blood sugars consistently high after meals and only occasionally at suppertime  He is trying to be consistent with taking his Humalog before meals  His weight has gone up somewhat without Victoza which was helping him control portions  Only in the last 4 days he has had readings on his contour meter        Side effects from medications have been: Unknown  Compliance with the medical regimen: Fair  Glucose monitoring:  done 3-4 times a day         Glucometer:  contour     Blood Glucose readings by recall:   Mean values apply above for all meters except median for One Touch  PRE-MEAL Fasting Lunch Dinner Bedtime Overall  Glucose range: 160-170 99-120  155   Mean/median:           Self-care: The diet that the patient has been following is none  Meal times are:  Breakfast is at 8 Lunch: 12 noon Dinner:6   Typical meal intake: Breakfast is usually cereal/PB toast  lunch is a sandwich, dinner is meat and vegetables/solid    Snacks: Sugar-free Jell-O or pudding, crackers          Dietician visit, most recent: Several years ago               CDE 8/18 Exercise:  unable to do any  Weight history: Range 252-280 previously  Wt Readings from Last 3 Encounters:  10/18/17 267 lb 12.8 oz (121.5 kg)  08/12/17 264 lb (119.7 kg)  07/22/17 271 lb 9.6  oz (123.2 kg)    Glycemic control:   Lab Results  Component Value Date   HGBA1C 7.4 (H) 10/15/2017   HGBA1C 7.1 07/22/2017   HGBA1C 7.7 (H) 03/29/2015   Lab Results  Component Value Date   CREATININE 1.45 10/15/2017   No results found for: [facility]  Lab Results  Component Value Date   FRUCTOSAMINE 233 08/09/2017      Allergies as of 10/18/2017      Reactions   Actos [pioglitazone] Swelling   Metformin And Related Nausea Only   Niaspan [niacin Er] Itching, Rash      Medication List        Accurate as of 10/18/17  1:45 PM. Always use your most recent med list.          allopurinol 300 MG tablet Commonly known as:  ZYLOPRIM Take 1 tablet by mouth daily. Take 1 tab by my mouth daily   amitriptyline 75 MG tablet Commonly known as:  ELAVIL Take 1 tablet by mouth daily.   aspirin EC 81 MG tablet Take 1 tablet (81 mg total) by mouth daily.   colchicine 0.6 MG tablet Take 0.6 mg by mouth daily as needed.     CONTOUR NEXT MONITOR w/Device Kit 1 Device by Does not apply route 3 (three) times daily. Use to check blood sugars 3 times daily. Dx Code E13.9   donepezil 10 MG tablet Commonly known as:  ARICEPT TAKE 1 TABLET BY MOUTH DAILY   glucose blood test strip Commonly known as:  CONTOUR NEXT TEST Use to check blood sugars 4 times daily. Dx Code E13.9   insulin lispro 100 UNIT/ML injection Commonly known as:  HUMALOG Inject 25-30 Units 4 (four) times daily into the skin. Takes 25-30u in am, 30u at lunch, 30u in evening   LANTUS SOLOSTAR 100 UNIT/ML Solostar Pen Generic drug:  Insulin Glargine Inject 30 units in the morning and inject 40 units in the evening   levothyroxine 75 MCG tablet Commonly known as:  SYNTHROID, LEVOTHROID   liraglutide 18 MG/3ML Sopn Commonly known as:  VICTOZA Inject 0.2 mLs (1.2 mg total) into the skin daily.   lisinopril 5 MG tablet Commonly known as:  PRINIVIL,ZESTRIL Take 1 tablet (5 mg total) by mouth daily.   metoprolol succinate 25 MG 24 hr tablet Commonly known as:  TOPROL-XL Take 1.5 tablets (37.5 mg total) by mouth daily.   NON FORMULARY Inhale 1 application into the lungs at bedtime. CPAP   potassium chloride SA 20 MEQ tablet Commonly known as:  K-DUR,KLOR-CON Take 20 mEq by mouth daily.   rosuvastatin 20 MG tablet Commonly known as:  CRESTOR Take 20 mg by mouth daily.   tamsulosin 0.4 MG Caps capsule Commonly known as:  FLOMAX Take 0.4 mg by mouth daily.   torsemide 10 MG tablet Commonly known as:  DEMADEX Take 37m by mouth alternating with 1058m       Allergies:  Allergies  Allergen Reactions  . Actos [Pioglitazone] Swelling  . Metformin And Related Nausea Only  . Niaspan [Niacin Er] Itching and Rash    Past Medical History:  Diagnosis Date  . Aortic stenosis, mild 11/14/2013  . Aortic valve sclerosis 03/29/2015  . Bilateral leg edema 05/21/2014  . CAD (coronary artery disease)   . Chronic diastolic congestive heart  failure ([facility]  . Chronic kidney disease (CKD), stage III (moderate) (HCC)   . Diabetes 1.5, managed as type 1 ([facility]2/22/2014  . Diabetic peripheral neuropathy associated with  type 1 diabetes mellitus ([place]) 11/14/2013  . Dysphagia   . Dyspnea on exertion 03/21/2015  . Exogenous obesity   . Gout   . Heart attack ([place])   . History of nuclear stress test 07/2011   dipyridamole; fixed inferolateral defect, worse at stress than rest; no reversible ischemia; low risk scan   . Hyperlipidemia   . Hypertension   . Insulin dependent diabetes mellitus ([place])   . Left foot drop   . Left main coronary artery disease 03/28/2015  . Memory loss   . Obesity (BMI 30-39.9) 11/14/2013  . Obstructive sleep apnea 03/21/2015  . OSA on CPAP    uses a cpap  . Peptic ulcer with hemorrhage 03/28/2015  . Peripheral neuropathy   . Rhabdomyolysis   . 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 Stroke ([place])    L patietal with small scattered lacunar infarcts  . Thrombocytopenia ([place]) 03/21/2015  . Venous insufficiency   . Weakness generalized 03/21/2015    Past Surgical History:  Procedure Laterality Date  . BACK SURGERY  2002   lumbosacral  . Carotid Doppler  03/2013   bilat bulb/prox ICAs - mild amount of fibrous plaque with no evidence of diameter reduction  . CHOLECYSTECTOMY    . COLONOSCOPY    . CORONARY ANGIOPLASTY  10/13/1996  . CORONARY ANGIOPLASTY  09/21/1989   emergency PTCA  . CORONARY ANGIOPLASTY  10/13/1996   Multi-Link diagonal & OD stenting (Dr. Marella Chimes)  . CORONARY ANGIOPLASTY  12/03/1997   disease of mid DX-1 ~50% & in mid PLA & PDA (distal lesions) (Dr. Marella Chimes)   . CORONARY ANGIOPLASTY  10/14/1999   progression of disease distal PLA & PDA; progression of disease prox RCA - moderate (Dr. Marella Chimes)   . CORONARY ANGIOPLASTY WITH STENT PLACEMENT  04/04/2004   4.0x8m non-DES (thrombectomy via AngioJet) to RCA for high grade stenosis (Dr. RMarella Chimes  . ROTATOR CUFF REPAIR Right   . SINUS ENDO W/FUSION    . TONSILLECTOMY    . TRANSTHORACIC ECHOCARDIOGRAM  08/08/2013   EF 55-60%, mild conc hypertrophy, grade 1 diastolic dysfunction; AV with mild stenosis; LA & RA mildly dilated    Family History  Problem Relation Age of Onset  . Heart disease Mother   . Coronary artery disease Father   . Cancer Maternal Grandmother   . Heart Problems Maternal Grandfather     Social History:  reports that he quit smoking about 44 years ago. His smoking use included cigarettes. He has a 40.00 pack-year smoking history. he has never used smokeless tobacco. He reports that he does not drink alcohol or use drugs.   Review of Systems   Lipid history: Last LDL was 54 in March 2018, currently on 20 mg rosuvastatin, followed by PCP   No results found for: CHOL, HDL, LDLCALC, LDLDIRECT, TRIG, CHOLHDL         Hypertension: Relatively mild and controlled with the 5 mg lisinopril and metoprolol   Most recent eye exam was in 2017, Dr. SGershon Craneand reportedly no retinopathy  Most recent foot exam: 81/74 Complications of diabetes: Mark peripheral neuropathy with sensory loss.   LABS:  Lab on 10/15/2017  Component Date Value Ref Range Status  . Hgb A1c MFr Bld 10/15/2017 7.4* 4.6 - 6.5 % Final   Glycemic Control Guidelines for People with Diabetes:Non Diabetic:  <6%Goal of Therapy: <7%Additional Action Suggested:  >8%   .  Sodium 10/15/2017 141  135 - 145 mEq/L Final  . Potassium 10/15/2017 3.3* 3.5 - 5.1 mEq/L Final  . Chloride 10/15/2017 100  96 - 112 mEq/L Final  . CO2 10/15/2017 34* 19 - 32 mEq/L Final  . Glucose, Bld 10/15/2017 100* 70 - 99 mg/dL Final  . BUN 10/15/2017 22  6 - 23 mg/dL Final  . Creatinine, Ser 10/15/2017 1.45  0.40 - 1.50 mg/dL Final  . Calcium 10/15/2017 9.1  8.4 - 10.5 mg/dL Final  . GFR 10/15/2017 50.19* >60.00 mL/min Final    Physical Examination:  BP 128/78   Pulse 66   Ht '5\' 7"'  (1.702 m)   Wt 267 lb  12.8 oz (121.5 kg)   SpO2 97%   BMI 41.94 kg/m      ASSESSMENT:  Diabetes type 2, uncontrolled with marked obesity and history of neuropathy  See history of present illness for detailed discussion of current diabetes management, blood sugar patterns and problems identified  Although he did remarkably well with adding Victoza to his basal bolus insulin regimen he has cut back the dose to 0.6 mg again and is needing significant amount of Humalog for his meals Difficult to assess his blood sugars as he did not bring his monitor He thinks his fasting readings are more consistently higher than any other time Weight is starting to go up again   PLAN:    He was shown how to dial up 5 clicks beyond the 0.6 dose on the Victoza pen  If he tolerates this he can go to 1.2 mg after 5 days  He may need to cut back his Humalog once Victoza is more effective  Also advised his wife not to give him Humalog if he is going to be fasting for lab work or other reasons  Meanwhile will increase his Lantus to 48 at night to get his morning sugars within the target range  Will consider using [place] or Toujeo before his next refill on Lantus for once a convenience Follow-up in 2 months  Patient Instructions  Check blood sugars on waking up  4-5/7  Also check blood sugars about 2 hours after a meal and do this after different meals by rotation  Recommended blood sugar levels on waking up is 90-130 and about 2 hours after meal is 130-160  Please bring your blood sugar monitor to each visit, thank you  Take 48 Lantus at nite  Call before refilling it  Victoza add 5 clicks then 0.0FV    Tranesha Lessner 10/18/2017, 1:45 PM   Note: This office note was prepared with Estate agent. Any transcriptional errors that result from this process are unintentional.

## 2017-11-01 ENCOUNTER — Other Ambulatory Visit: Payer: Self-pay

## 2017-11-01 ENCOUNTER — Telehealth: Payer: Self-pay | Admitting: Endocrinology

## 2017-11-01 MED ORDER — INSULIN DEGLUDEC 200 UNIT/ML ~~LOC~~ SOPN: 48.0000 [IU] | PEN_INJECTOR | Freq: Every day | SUBCUTANEOUS | 2 refills | Status: DC

## 2017-11-01 NOTE — Telephone Encounter (Signed)
Patient wife stated that dr Dwyane Dee told patient to let him know when he is on his last lantus pen. Please advise

## 2017-11-01 NOTE — Telephone Encounter (Signed)
Send prescription for Tresiba U-200, 48 units once daily to replace Lantus

## 2017-11-01 NOTE — Telephone Encounter (Signed)
Please advise 

## 2017-11-01 NOTE — Telephone Encounter (Signed)
I have sent this prescription to [facility] since this is where the Lantus went. I tried to call the patient at home but no answering machine came on.

## 2017-11-02 NOTE — Telephone Encounter (Signed)
Re: Prescription for Traceba-Pharmacy sent back prescription to Dr Dwyane Dee because it was not correctly coded. Please contact [facility] on [facility] in [place] and give them the correct code, per Mrs. Martinique

## 2017-11-02 NOTE — Telephone Encounter (Signed)
Try Toujeo instead of [place]

## 2017-11-02 NOTE — Telephone Encounter (Signed)
Called pharmacy and spoke to pharmacist and he stated that the patient needs a PA for the [place]. He has only had Lantus and they may not approve since he has not tried and failed 2 different medications (I only see Lantus). Please advise if you want to proceed with [place] PA or if you want to use Lantus.

## 2017-11-03 ENCOUNTER — Other Ambulatory Visit: Payer: Self-pay

## 2017-11-03 MED ORDER — INSULIN GLARGINE 300 UNIT/ML ~~LOC~~ SOPN: 48.0000 [IU] | PEN_INJECTOR | Freq: Every day | SUBCUTANEOUS | 0 refills | Status: DC

## 2017-11-03 NOTE — Telephone Encounter (Signed)
I have sent the new prescription for Toujeo to [facility] for the patient.

## 2017-11-03 NOTE — Telephone Encounter (Signed)
Same dose 

## 2017-11-03 NOTE — Telephone Encounter (Signed)
Please advise if we are going to use the same dosage for Toujeo as prescribed for the Tresiba.

## 2017-11-12 ENCOUNTER — Telehealth: Payer: Self-pay | Admitting: Endocrinology

## 2017-11-12 NOTE — Telephone Encounter (Signed)
[employer] needs clinical for tresedia flex touch-2nd attempt for clinical.  Lack of information can result in a denial-Please call ph# [phone]

## 2017-11-12 NOTE — Telephone Encounter (Signed)
Randall Wilkins with [employer] received the information that she needed and will reach out to the patient and to [place] once this is reviewed.

## 2017-11-12 NOTE — Telephone Encounter (Signed)
Called [employer] and spoke to Rogers and he stated that he is bringing [place] on the line to complete the clinical information that they need.

## 2017-11-15 DIAGNOSIS — E139 Other specified diabetes mellitus without complications: Secondary | ICD-10-CM | POA: Diagnosis not present

## 2017-11-16 ENCOUNTER — Telehealth: Payer: Self-pay

## 2017-11-16 NOTE — Telephone Encounter (Signed)
[employer] has approved patient for Randall Wilkins from now until 11/12/18- patient has been notified

## 2017-12-17 ENCOUNTER — Other Ambulatory Visit (INDEPENDENT_AMBULATORY_CARE_PROVIDER_SITE_OTHER): Payer: Medicare Other

## 2017-12-17 DIAGNOSIS — Z794 Long term (current) use of insulin: Secondary | ICD-10-CM

## 2017-12-17 DIAGNOSIS — E1165 Type 2 diabetes mellitus with hyperglycemia: Secondary | ICD-10-CM

## 2017-12-17 LAB — BASIC METABOLIC PANEL
BUN: 20 mg/dL (ref 6–23)
CO2: 33 mEq/L — ABNORMAL HIGH (ref 19–32)
Calcium: 8.9 mg/dL (ref 8.4–10.5)
Chloride: 101 mEq/L (ref 96–112)
Creatinine, Ser: 1.37 mg/dL (ref 0.40–1.50)
GFR: 53.56 mL/min — ABNORMAL LOW (ref >60.00)
Glucose, Bld: 79 mg/dL (ref 70–99)
Potassium: 3.6 mEq/L (ref 3.5–5.1)
Sodium: 142 mEq/L (ref 135–145)

## 2017-12-17 LAB — MICROALBUMIN / CREATININE URINE RATIO
Creatinine,U: 72.3 mg/dL
Microalb Creat Ratio: 27.7 mg/g (ref 0.0–30.0)
Microalb, Ur: 20 mg/dL — ABNORMAL HIGH (ref 0.0–1.9)

## 2017-12-18 LAB — FRUCTOSAMINE: Fructosamine: 252 umol/L (ref 0–285)

## 2017-12-19 NOTE — Progress Notes (Signed)
Patient ID: Randall Wilkins, male   DOB: 1941/10/16, 77 y.o.   MRN: [id number]           Reason for Appointment: Follow-up for Type 2 Diabetes   History of Present Illness:          Date of diagnosis of type 2 diabetes mellitus:   1998       Background history:   Has been on insulin since about 2001 He thinks he was given metformin only for a month and may have stopped because of side effects, does not remember specifics. Also Actos was tried and not clear what side effects he had Does not remember other medication he may have tried but has not tried Victoza or Byetta   Recent history:   INSULIN regimen is:   [place] 48 units at around 8 PM .  Humalog 25-30 units 3 times a day ac-pc     Non-insulin hypoglycemic drugs the patient is taking are: Victoza 1.2 mg daily  His last A1c is 7.4, previously 7.1 but has been as high as 8.1  Fructosamine is 252, previously better  Current management, blood sugar patterns and problems identified:  He has been on 1.2 mg VICTOZA since his last visit  He does not have any nausea currently  However he is having decreased appetite overall and frequently will skip lunch  He is only eating a blueberry muffin at breakfast but is still taking 25-30 units of Humalog in the morning, lab glucose was 79 probably after his morning Humalog  FASTING blood sugars are extremely variable but mostly high and still above target  He was switched to [place] from Lantus and currently is taking a lower total dose than he was taking Lantus  He is checking his blood sugars sometimes after breakfast and also at bedtime but not after lunch or before suppertime usually  He may have had some higher reading lately because of a respiratory infection but is feeling better now and not clear why his blood sugar was 298 this morning  His wife is helping him with his insulin regimen, however she has questions about his diet and he has not seen the nutritionist, this was  recommended in August last year  No hypoglycemia with lowest blood sugar 83 late afternoon        Side effects from medications have been: Unknown  Compliance with the medical regimen: Fair  Glucose monitoring:  done 3 times a day         Glucometer:  contour     Blood Glucose readings by :   Mean values apply above for all meters except median for One Touch  PRE-MEAL Fasting Lunch Dinner Bedtime Overall  Glucose range: 150-298    97-302    Mean/median: 201   186     POST-MEAL PC Breakfast PC Lunch PC Dinner  Glucose range:     Mean/median: 191       Self-care: The diet that the patient has been following is none  Meal times are:  Breakfast is at 8 Lunch: 12 noon Dinner:6   Typical meal intake: Breakfast is usually a blueberry muffin   lunch is a sandwich, dinner is meat and vegetables/solid    Snacks: Sugar-free Jell-O or pudding, crackers          Dietician visit, most recent: Several years ago               CDE 8/18 Exercise:  unable to do any  Weight history: Range 252-280 previously  Wt Readings from Last 3 Encounters:  12/20/17 266 lb (120.7 kg)  10/18/17 267 lb 12.8 oz (121.5 kg)  08/12/17 264 lb (119.7 kg)    Glycemic control:   Lab Results  Component Value Date   HGBA1C 7.4 (H) 10/15/2017   HGBA1C 7.1 07/22/2017   HGBA1C 7.7 (H) 03/29/2015   Lab Results  Component Value Date   MICROALBUR 20.0 (H) 12/17/2017   CREATININE 1.37 12/17/2017   Lab Results  Component Value Date   MICRALBCREAT 27.7 12/17/2017    Lab Results  Component Value Date   FRUCTOSAMINE 252 12/17/2017   FRUCTOSAMINE 233 08/09/2017      Allergies as of 12/20/2017      Reactions   Actos [pioglitazone] Swelling   Metformin And Related Nausea Only   Niaspan [niacin Er] Itching, Rash      Medication List        Accurate as of 12/20/17  8:25 AM. Always use your most recent med list.          allopurinol 300 MG tablet Commonly known as:  ZYLOPRIM Take 1 tablet by  mouth daily. Take 1 tab by my mouth daily   amitriptyline 75 MG tablet Commonly known as:  ELAVIL Take 1 tablet by mouth daily.   aspirin EC 81 MG tablet Take 1 tablet (81 mg total) by mouth daily.   colchicine 0.6 MG tablet Take 0.6 mg by mouth daily as needed.   CONTOUR NEXT MONITOR w/Device Kit 1 Device by Does not apply route 3 (three) times daily. Use to check blood sugars 3 times daily. Dx Code E13.9   donepezil 10 MG tablet Commonly known as:  ARICEPT TAKE 1 TABLET BY MOUTH DAILY   glucose blood test strip Commonly known as:  CONTOUR NEXT TEST Use to check blood sugars 4 times daily. Dx Code E13.9   Insulin Degludec 200 UNIT/ML Sopn Commonly known as:  TRESIBA FLEXTOUCH Inject 48 Units daily into the skin.   insulin lispro 100 UNIT/ML injection Commonly known as:  HUMALOG Inject 25-30 Units 4 (four) times daily into the skin. Takes 25-30u in am, 30u at lunch, 30u in evening   LANTUS SOLOSTAR 100 UNIT/ML Solostar Pen Generic drug:  Insulin Glargine Inject 30 units in the morning and inject 40 units in the evening   Insulin Glargine 300 UNIT/ML Sopn Commonly known as:  TOUJEO SOLOSTAR Inject 48 Units into the skin daily.   levothyroxine 75 MCG tablet Commonly known as:  SYNTHROID, LEVOTHROID   liraglutide 18 MG/3ML Sopn Commonly known as:  VICTOZA Inject 0.2 mLs (1.2 mg total) daily into the skin.   lisinopril 5 MG tablet Commonly known as:  PRINIVIL,ZESTRIL Take 1 tablet (5 mg total) by mouth daily.   metoprolol succinate 25 MG 24 hr tablet Commonly known as:  TOPROL-XL Take 1.5 tablets (37.5 mg total) by mouth daily.   NON FORMULARY Inhale 1 application into the lungs at bedtime. CPAP   potassium chloride SA 20 MEQ tablet Commonly known as:  K-DUR,KLOR-CON Take 20 mEq by mouth 2 (two) times daily.   rosuvastatin 20 MG tablet Commonly known as:  CRESTOR Take 20 mg by mouth daily.   tamsulosin 0.4 MG Caps capsule Commonly known as:  FLOMAX Take  0.4 mg by mouth daily.   torsemide 10 MG tablet Commonly known as:  DEMADEX Take 31m by mouth alternating with 1061m       Allergies:  Allergies  Allergen Reactions  .  Actos [Pioglitazone] Swelling  . Metformin And Related Nausea Only  . Niaspan [Niacin Er] Itching and Rash    Past Medical History:  Diagnosis Date  . Aortic stenosis, mild 11/14/2013  . Aortic valve sclerosis 03/29/2015  . Bilateral leg edema 05/21/2014  . CAD (coronary artery disease)   . Chronic diastolic congestive heart failure ([place])   . Chronic kidney disease (CKD), stage III (moderate) ([facility])   . Diabetes 1.5, managed as type 1 ([place]) 02/04/2013  . Diabetic peripheral neuropathy associated with type 1 diabetes mellitus ([place]) 11/14/2013  . Dysphagia   . Dyspnea on exertion 03/21/2015  . Exogenous obesity   . Gout   . Heart attack ([place])   . History of nuclear stress test 07/2011   dipyridamole; fixed inferolateral defect, worse at stress than rest; no reversible ischemia; low risk scan   . Hyperlipidemia   . Hypertension   . Insulin dependent diabetes mellitus ([place])   . Left foot drop   . Left main coronary artery disease 03/28/2015  . Memory loss   . Obesity (BMI 30-39.9) 11/14/2013  . Obstructive sleep apnea 03/21/2015  . OSA on CPAP    uses a cpap  . Peptic ulcer with hemorrhage 03/28/2015  . Peripheral neuropathy   . Rhabdomyolysis   . 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 Stroke ([place])    L patietal with small scattered lacunar infarcts  . Thrombocytopenia ([place]) 03/21/2015  . Venous insufficiency   . Weakness generalized 03/21/2015    Past Surgical History:  Procedure Laterality Date  . BACK SURGERY  2002   lumbosacral  . Carotid Doppler  03/2013   bilat bulb/prox ICAs - mild amount of fibrous plaque with no evidence of diameter reduction  . CARPAL TUNNEL RELEASE Bilateral 08/09/2014   Procedure: BILATERAL CARPAL TUNNEL RELEASE;  Surgeon: Daryll Brod, MD;   Location: [facility];  Service: Orthopedics;  Laterality: Bilateral;  ANESTHESIA:  IV REGIONAL BIL FAB  . CHOLECYSTECTOMY    . COLONOSCOPY    . CORONARY ANGIOPLASTY  10/13/1996  . CORONARY ANGIOPLASTY  09/21/1989   emergency PTCA  . CORONARY ANGIOPLASTY  10/13/1996   Multi-Link diagonal & OD stenting (Dr. Marella Chimes)  . CORONARY ANGIOPLASTY  12/03/1997   disease of mid DX-1 ~50% & in mid PLA & PDA (distal lesions) (Dr. Marella Chimes)   . CORONARY ANGIOPLASTY  10/14/1999   progression of disease distal PLA & PDA; progression of disease prox RCA - moderate (Dr. Marella Chimes)   . CORONARY ANGIOPLASTY WITH STENT PLACEMENT  04/04/2004   4.0x54m non-DES (thrombectomy via AngioJet) to RCA for high grade stenosis (Dr. RMarella Chimes  . CORONARY ARTERY BYPASS GRAFT N/A 03/29/2015   Procedure: CORONARY ARTERY BYPASS GRAFTING TIMES TWO USING LEFT INTERNAL MAMMARY ARTERY AND RIGHT LEG GREATER SAPHENOUS VEIN HARVESTED ENDOSCOPICALLY.;  Surgeon: CRexene Alberts MD;  [facility] [facility]  Service: Open Heart Surgery;  Laterality: N/A;  . ESOPHAGOGASTRODUODENOSCOPY N/A 03/27/2015   Procedure: ESOPHAGOGASTRODUODENOSCOPY (EGD);  Surgeon: MClarene Essex MD;  [facility];  Service: Endoscopy;  Laterality: N/A;  possible dilation  . LEFT HEART CATHETERIZATION WITH CORONARY ANGIOGRAM N/A 03/28/2015   Procedure: LEFT HEART CATHETERIZATION WITH CORONARY ANGIOGRAM;  Surgeon: Peter M JMartinique MD;  [facility];  Service: Cardiovascular;  Laterality: N/A;  . ROTATOR CUFF REPAIR Right   . SINUS ENDO W/FUSION    . TONSILLECTOMY    .  TRANSTHORACIC ECHOCARDIOGRAM  08/08/2013   EF 55-60%, mild conc hypertrophy, grade 1 diastolic dysfunction; AV with mild stenosis; LA & RA mildly dilated    Family History  Problem Relation Age of Onset  . Heart disease Mother   . Coronary artery disease Father   . Cancer Maternal Grandmother   . Heart Problems Maternal Grandfather     Social History:   reports that he quit smoking about 44 years ago. His smoking use included cigarettes. He has a 40.00 pack-year smoking history. he has never used smokeless tobacco. He reports that he does not drink alcohol or use drugs.   Review of Systems   Lipid history: Last LDL was 54 in March 2018, currently on 20 mg rosuvastatin, followed by PCP   No results found for: CHOL, HDL, LDLCALC, LDLDIRECT, TRIG, CHOLHDL         Hypertension: Relatively mild and controlled with the 5 mg lisinopril and metoprolol   Most recent eye exam was in 2017, Dr. Gershon Crane and reportedly no retinopathy  Most recent foot exam: 6/37  Complications of diabetes: Mark peripheral neuropathy with sensory loss.   LABS:  Lab on 12/17/2017  Component Date Value Ref Range Status  . Sodium 12/17/2017 142  135 - 145 mEq/L Final  . Potassium 12/17/2017 3.6  3.5 - 5.1 mEq/L Final  . Chloride 12/17/2017 101  96 - 112 mEq/L Final  . CO2 12/17/2017 33* 19 - 32 mEq/L Final  . Glucose, Bld 12/17/2017 79  70 - 99 mg/dL Final  . BUN 12/17/2017 20  6 - 23 mg/dL Final  . Creatinine, Ser 12/17/2017 1.37  0.40 - 1.50 mg/dL Final  . Calcium 12/17/2017 8.9  8.4 - 10.5 mg/dL Final  . GFR 12/17/2017 53.56* >60.00 mL/min Final  . Microalb, Ur 12/17/2017 20.0* 0.0 - 1.9 mg/dL Final  . Creatinine,U 12/17/2017 72.3  mg/dL Final  . Microalb Creat Ratio 12/17/2017 27.7  0.0 - 30.0 mg/g Final  . Fructosamine 12/17/2017 252  0 - 285 umol/L Final   Comment: Published reference interval for apparently healthy subjects between age 71 and 60 is 2 - 285 umol/L and in a poorly controlled diabetic population is 228 - 563 umol/L with a mean of 396 umol/L.     Physical Examination:  BP 120/60   Pulse 60   Ht _0  (1.702 m)   Wt 266 lb (120.7 kg)   SpO2 97%   BMI 41.66 kg/m      ASSESSMENT:  Diabetes type 2, uncontrolled with marked obesity and history of neuropathy  See history of present illness for detailed discussion of current  diabetes management, blood sugar patterns and problems identified  Although he initially did better with Victoza now his fasting readings are as high as 298 and not clear why they fluctuate His blood sugars are not consistently high after supper but based on his diet may be as high as 302 He may benefit from consultation with dietitian Also eating somewhat unbalanced breakfast in the morning   PLAN:    He was advised on increasing his [place] Aas to get morning sugars down  For now he will increase by 4 units and another 2 units every 2 weeks until morning sugars at least 150 consistently, discussed actions of basal insulin  Continue 1.2 Victoza, discussed benefits of this  Discussed timing of Humalog before meals  To use smaller doses of Humalog when eating lighter meals in the evenings and probably needs no more than 20-25 at  breakfast   He does need to do more readings after about 2 hours of eating various meals and discussed targets for after meal blood sugars  He really needs to have more consistent protein in the morning Consultation with the dietitian   Patient Instructions  Check blood sugars on waking up    Also check blood sugars about 2 hours after a meal and do this after different meals by rotation  Recommended blood sugar levels on waking up is 90-130 and about 2 hours after meal is 130-160  Please bring your blood sugar monitor to each visit, thank you  [place] 52 units  Get am sugar < 150  Counseling time on subjects discussed in assessment and plan sections is over 50% of today's 25 minute visit   Elayne Snare 12/20/2017, 8:25 AM   Note: This office note was prepared with Dragon voice recognition system technology. Any transcriptional errors that result from this process are unintentional.

## 2017-12-20 ENCOUNTER — Ambulatory Visit: Payer: Medicare Other | Admitting: Endocrinology

## 2017-12-20 ENCOUNTER — Encounter: Payer: Self-pay | Admitting: Endocrinology

## 2017-12-20 ENCOUNTER — Other Ambulatory Visit: Payer: Self-pay

## 2017-12-20 VITALS — BP 120/60 | HR 60 | Ht 67.0 in | Wt 266.0 lb

## 2017-12-20 DIAGNOSIS — E1165 Type 2 diabetes mellitus with hyperglycemia: Secondary | ICD-10-CM | POA: Diagnosis not present

## 2017-12-20 DIAGNOSIS — E139 Other specified diabetes mellitus without complications: Secondary | ICD-10-CM | POA: Diagnosis not present

## 2017-12-20 DIAGNOSIS — Z794 Long term (current) use of insulin: Secondary | ICD-10-CM

## 2017-12-20 MED ORDER — INSULIN LISPRO 100 UNIT/ML ~~LOC~~ SOLN: 25.0000 [IU] | Freq: Four times a day (QID) | SUBCUTANEOUS | 4 refills | Status: DC

## 2017-12-20 NOTE — Patient Instructions (Addendum)
Check blood sugars on waking up    Also check blood sugars about 2 hours after a meal and do this after different meals by rotation  Recommended blood sugar levels on waking up is 90-130 and about 2 hours after meal is 130-160  Please bring your blood sugar monitor to each visit, thank you  Tresiba 52 units  Get am sugar < 150  Humalog 20-25 vat Bfst

## 2017-12-22 ENCOUNTER — Ambulatory Visit: Payer: Medicare Other | Admitting: Neurology

## 2017-12-22 ENCOUNTER — Encounter: Payer: Self-pay | Admitting: Neurology

## 2017-12-22 VITALS — BP 167/64 | HR 81 | Ht 67.0 in | Wt 267.5 lb

## 2017-12-22 DIAGNOSIS — R269 Unspecified abnormalities of gait and mobility: Secondary | ICD-10-CM

## 2017-12-22 DIAGNOSIS — G3184 Mild cognitive impairment, so stated: Secondary | ICD-10-CM | POA: Diagnosis not present

## 2017-12-22 MED ORDER — AMITRIPTYLINE HCL 25 MG PO TABS: 75.0000 mg | ORAL_TABLET | Freq: Every day | ORAL | 11 refills | Status: DC

## 2017-12-22 MED ORDER — DONEPEZIL HCL 10 MG PO TABS: ORAL_TABLET | ORAL | 4 refills | Status: DC

## 2017-12-22 MED ORDER — AMITRIPTYLINE HCL 75 MG PO TABS: 75.0000 mg | ORAL_TABLET | Freq: Every day | ORAL | 11 refills | Status: DC

## 2017-12-22 NOTE — Progress Notes (Signed)
[facility]  PATIENT: Randall Wilkins DOB: 1941/05/18  HISTORY OF PRESENT ILLNESS: He had a past medical history of hypertension, insulin-dependent diabetes, hyperlipidemia, coronary artery disease, diabetic peripheral neuropathy.  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  At baseline he ambulates with a left ankle brace, still active at home, manage his own finances, driving without difficulty, was able to fix home electricity such as lawn mow without difficulty, over the past 6 months, he became forgetful, he tends to repeat himself, forgot name,   He had MRI at [place], there is evidence of old left parietal subcortical stroke, mild small vessel disease,  echocardiogram July 2013 showed ejection fraction 55%,RVSP of 36, mild aortic valvular stenosis,  BUN and creatinine 23/1 point 38, LDL 45, triglyceride 104,  He has not had further stroke or TIA symtoms. He is currently on Plavix and aspirin with moderate bruising. He is also taking Aricept, tolerating medication well, he has no recurrent strokelike symptoms, he complains of bilateral hand, and feet paresthesia, previously tried and failed different neuropathic pain medications, this including Neurontin, Lyrica, Cymbalta, He was put on amitriptyline around March 2015, 50 mg twice a day, tolerating it well, it does help him moderately, but he still complains of dense numbness of his bilateral fingertips, and toes.  UPDATE 12/11/2014: He is overall doing very well, tolerating current medications, amitriptyline 50 mg every night has helped his sleep, he continued to ambulate with left ankle brace  UPDATE Dec 22 2017:  He has  worsening short term memory loss, he only drive short distance, he is less active,  He has gait abnormality, rely on his cane all the time now, his wife help him with insulin shots,   REVIEW OF SYSTEMS: Full 14 system review of systems performed and notable only for those listed, all others are neg:  Appetite change, hearing loss, runny nose, cough, leg swelling, cold intolerance, apnea, daytime sleepiness, joint pain, walking difficulty, memory loss, numbness, confusion   ALLERGIES: Allergies  Allergen Reactions  . Actos [Pioglitazone] Swelling  . Metformin And Related Nausea Only  . Niaspan [Niacin Er] Itching and Rash    HOME MEDICATIONS: Outpatient Medications Prior to Visit  Medication Sig Dispense Refill  . allopurinol (ZYLOPRIM) 300 MG tablet Take 1 tablet by mouth daily. Take 1 tab by my mouth daily  2  . amitriptyline (ELAVIL) 75 MG tablet Take 1 tablet by mouth daily.  0  . aspirin 81 MG tablet Take 1 tablet (81 mg total) by mouth daily.    . Blood Glucose Monitoring Suppl (CONTOUR NEXT MONITOR) w/Device KIT 1 Device by Does not apply route 3 (three) times daily. Use to check blood sugars 3 times daily. Dx Code E13.9 1 kit 2  . colchicine 0.6 MG tablet Take 0.6 mg by mouth daily as needed.    . donepezil (ARICEPT) 10 MG tablet TAKE 1 TABLET BY MOUTH DAILY 90 tablet 2  . glucose blood (CONTOUR NEXT TEST) test strip Use to check blood sugars 4 times daily. Dx Code E13.9 125 each 3  . Insulin Degludec (TRESIBA FLEXTOUCH) 200 UNIT/ML SOPN Inject 48 Units daily into the skin. (Patient taking differently: Inject 48 Units into the skin daily. Reports  taking 52 units each night.) 21 mL 2  . insulin lispro (HUMALOG) 100 UNIT/ML injection Inject 0.25-0.3 mLs (25-30 Units total) into the skin 4 (four) times daily. Takes 25-30u in am, 30u at lunch, 30u in evening 60 mL 4  . levothyroxine (SYNTHROID, LEVOTHROID) 75 MCG tablet   1  . liraglutide (VICTOZA) 18 MG/3ML SOPN Inject 0.2 mLs (1.2  mg total) daily into the skin. 2 pen 2  . lisinopril (PRINIVIL,ZESTRIL) 5 MG tablet Take 1 tablet (5 mg total) by mouth daily. 90 tablet 2  . metoprolol succinate (TOPROL-XL) 25 MG 24 hr tablet Take 1.5 tablets (37.5 mg total) by mouth daily. 135 tablet 3  . NON FORMULARY Inhale 1 application into the lungs at bedtime. CPAP    . potassium chloride SA (K-DUR,KLOR-CON) 20 MEQ tablet Take 20 mEq by mouth 2 (two) times daily.     . rosuvastatin (CRESTOR) 20 MG tablet Take 20 mg by mouth daily.    . Tamsulosin HCl (FLOMAX) 0.4 MG CAPS Take 0.4 mg by mouth daily.     Marland Kitchen torsemide (DEMADEX) 10 MG tablet Take 30m by mouth alternating with 1015m    . Insulin Glargine (TOUJEO SOLOSTAR) 300 UNIT/ML SOPN Inject 48 Units into the skin daily. (Patient not taking: Reported on 12/20/2017) 15 mL 0  . LANTUS SOLOSTAR 100 UNIT/ML Solostar Pen Inject 30 units in the morning and inject 40 units in the evening (Patient not taking: Reported on 12/20/2017) 30 mL 2   No facility-administered medications prior to visit.     PAST MEDICAL HISTORY: Past Medical History:  Diagnosis Date  . Aortic stenosis, mild 11/14/2013  . Aortic valve sclerosis 03/29/2015  . Bilateral leg edema 05/21/2014  . CAD (coronary artery disease)   . Chronic diastolic congestive heart failure ([facility]  . Chronic kidney disease (CKD), stage III (moderate) (HCC)   . Diabetes 1.5, managed as type 1 ([place]2/22/2014  . Diabetic peripheral neuropathy associated with type 1 diabetes mellitus (HCGulf12/01/2013  . Dysphagia   . Dyspnea on exertion 03/21/2015  . Exogenous obesity   . Gout   . Heart attack ([facility]  . History of nuclear stress test 07/2011   dipyridamole; fixed inferolateral defect, worse at stress than rest; no reversible ischemia; low risk scan   . Hyperlipidemia   . Hypertension   . Insulin dependent diabetes mellitus (HCAmo  . Left foot drop   . Left main coronary artery disease 03/28/2015  . Memory loss   . Obesity (BMI 30-39.9)  11/14/2013  . Obstructive sleep apnea 03/21/2015  . OSA on CPAP    uses a cpap  . Peptic ulcer with hemorrhage 03/28/2015  . Peripheral neuropathy   . Rhabdomyolysis   . 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 Stroke ([place]CF[place]   L patietal with small scattered lacunar infarcts  . Thrombocytopenia (HCAtl[place]4/06/2015  . Venous insufficiency   . Weakness generalized 03/21/2015    PAST SURGICAL HISTORY: Past Surgical History:  Procedure Laterality Date  . BACK SURGERY  2002   lumbosacral  . Carotid Doppler  03/2013   bilat bulb/prox ICAs - mild amount of fibrous plaque with no evidence of diameter reduction  . CARPAL TUNNEL RELEASE Bilateral 08/09/2014   Procedure: BILATERAL CARPAL TUNNEL RELEASE;  Surgeon: GaDaryll BrodMD;  [facility] Service: [facility];  Laterality: Bilateral;  ANESTHESIA:  IV REGIONAL BIL FAB  .  CHOLECYSTECTOMY    . COLONOSCOPY    . CORONARY ANGIOPLASTY  10/13/1996  . CORONARY ANGIOPLASTY  09/21/1989   emergency PTCA  . CORONARY ANGIOPLASTY  10/13/1996   Multi-Link diagonal & OD stenting (Dr. Marella Chimes)  . CORONARY ANGIOPLASTY  12/03/1997   disease of mid DX-1 ~50% & in mid PLA & PDA (distal lesions) (Dr. Marella Chimes)   . CORONARY ANGIOPLASTY  10/14/1999   progression of disease distal PLA & PDA; progression of disease prox RCA - moderate (Dr. Marella Chimes)   . CORONARY ANGIOPLASTY WITH STENT PLACEMENT  04/04/2004   4.0x46m non-DES (thrombectomy via AngioJet) to RCA for high grade stenosis (Dr. RMarella Chimes  . CORONARY ARTERY BYPASS GRAFT N/A 03/29/2015   Procedure: CORONARY ARTERY BYPASS GRAFTING TIMES TWO USING LEFT INTERNAL MAMMARY ARTERY AND RIGHT LEG GREATER SAPHENOUS VEIN HARVESTED ENDOSCOPICALLY.;  Surgeon: CRexene Alberts MD;  [facility]  Service: Open Heart Surgery;  Laterality: N/A;  . ESOPHAGOGASTRODUODENOSCOPY N/A 03/27/2015   Procedure: ESOPHAGOGASTRODUODENOSCOPY (EGD);  Surgeon: MClarene Essex MD;  [facility];  Service: Endoscopy;  Laterality: N/A;  possible dilation  . LEFT HEART CATHETERIZATION WITH CORONARY ANGIOGRAM N/A 03/28/2015   Procedure: LEFT HEART CATHETERIZATION WITH CORONARY ANGIOGRAM;  Surgeon: Peter M JMartinique MD;  [facility];  Service: Cardiovascular;  Laterality: N/A;  . ROTATOR CUFF REPAIR Right   . SINUS ENDO W/FUSION    . TONSILLECTOMY    . TRANSTHORACIC ECHOCARDIOGRAM  08/08/2013   EF 55-60%, mild conc hypertrophy, grade 1 diastolic dysfunction; AV with mild stenosis; LA & RA mildly dilated    FAMILY HISTORY: Family History  Problem Relation Age of Onset  . Heart disease Mother   . Coronary artery disease Father   . Cancer Maternal Grandmother   . Heart Problems Maternal Grandfather     SOCIAL HISTORY: Social History   Socioeconomic History  . Marital status: Married    Spouse name: Not on file  . Number of children: 3  . Years of education: 181 . Highest education level: Not on file  Social Needs  . Financial resource strain: Not on file  . Food insecurity - worry: Not on file  . Food insecurity - inability: Not on file  . Transportation needs - medical: Not on file  . Transportation needs - non-medical: Not on file  Occupational History  . Not on file  Tobacco Use  . Smoking status: Former Smoker    Packs/day: 2.00    Years: 20.00    Pack years: 40.00    Types: Cigarettes    Last attempt to quit: 01/25/1973    Years since quitting: 44.9  . Smokeless tobacco: Never Used  Substance and Sexual Activity  . Alcohol use: No    Alcohol/week: 0.0 oz  . Drug use: No  . Sexual activity: Not on file  Other Topics Concern  . Not on file  Social History Narrative   Lives with wife.      PHYSICAL EXAM  Vitals:   12/22/17 0844  BP: (!) 167/64  Pulse: 81  Weight: 267 lb 8 oz (121.3 kg)  Height: '5\' 7"'  (1.702 m)   Body mass index is 41.9 kg/m. Generalized: Well developed, obese male in no acute distress   Head: normocephalic and atraumatic,. Oropharynx benign  Neck: Supple, no carotid bruits  Cardiac: Regular rate rhythm, no murmur  Musculoskeletal: Left foot drop AFO In place Neurological examination   Mentation: Alert oriented to time, place, history  taking. MMSE - Mini Mental State Exam 12/22/2017 12/16/2016 12/12/2015  Orientation to time '4 4 4  ' Orientation to Place '5 5 5  ' Registration '3 3 3  ' Attention/ Calculation '4 4 5  ' Recall 2 0 1  Language- name 2 objects '2 2 2  ' Language- repeat '1 1 1  ' Language- follow 3 step command '3 3 3  ' Language- read & follow direction '1 1 1  ' Write a sentence '1 1 1  ' Copy design '1 1 1  ' Total score '27 25 27  ' Animal naming 14  Cranial nerve II-XII: Pupils were equal round reactive to light extraocular movements were full, visual field were full on confrontational test. Facial sensation and strength were normal. Hearing was intact to finger rubbing bilaterally. Uvula tongue midline. head turning and shoulder shrug were normal and symmetric.Tongue protrusion into cheek strength was normal. Motor: normal bulk and tone, full strength in the BUE, BLE, on the right , left lower extremity weakness with AFO in place  Sensory: Decreased pinprick and vibratory at both legs to midshin level, decreased pinprick at finger tips Coordination: finger-nose-finger, heel-to-shin normal on the right, unable to perform on the left  Reflexes: 1+ and symmetric except absent Achilles plantar responses were flexor bilaterally. Gait and Station: Wear left ankle brace, rely on his cane to get up from seated position, antalgic, mildly unsteady  DIAGNOSTIC DATA (LABS, IMAGING, TESTING) - I reviewed patient records, labs, notes, testing and imaging myself where available.  Lab Results  Component Value Date   WBC 13.0 (H) 04/05/2015   HGB 10.7 (L) 04/05/2015   HCT 33.3 (L) 04/05/2015   MCV 90.2 04/05/2015   PLT 280 04/05/2015      Component Value Date/Time   NA 142 12/17/2017  1057   K 3.6 12/17/2017 1057   CL 101 12/17/2017 1057   CO2 33 (H) 12/17/2017 1057   GLUCOSE 79 12/17/2017 1057   BUN 20 12/17/2017 1057   CREATININE 1.37 12/17/2017 1057   CALCIUM 8.9 12/17/2017 1057   PROT 5.3 (L) 03/29/2015 0054   ALBUMIN 2.4 (L) 03/29/2015 0054   AST 29 03/29/2015 0054   ALT 29 03/29/2015 0054   ALKPHOS 62 03/29/2015 0054   BILITOT 0.7 03/29/2015 0054   GFRNONAA 31 (L) 04/05/2015 1209   GFRAA 36 (L) 04/05/2015 1209    Lab Results  Component Value Date   HGBA1C 7.4 (H) 10/15/2017     ASSESSMENT AND PLAN  77 y.o. year old male Gait abnormality  Multifactorial at this including previous left ankle injury, he wears left AFO, with his cane, obesity, deconditioning Mild cognitive impairment  Slow worsening, most likely central nervous system degenerative disorder.  Likely a component of vascular risk factor, deconditioning,   Is taking amitriptyline 75 mg every night for insomnia, I have advised him to gradually tapering it off to avoid long-term side effects,  Continue Aricept 10 mg daily,  Home physical therapy  Marcial Pacas, M.D. Ph.D.  [facility] [place], [place] [address] Phone: [phone] Fax:      [phone]

## 2017-12-24 DIAGNOSIS — G3184 Mild cognitive impairment, so stated: Secondary | ICD-10-CM | POA: Insufficient documentation

## 2017-12-24 DIAGNOSIS — R269 Unspecified abnormalities of gait and mobility: Secondary | ICD-10-CM | POA: Insufficient documentation

## 2017-12-31 ENCOUNTER — Encounter: Payer: Self-pay | Admitting: Dietician

## 2017-12-31 ENCOUNTER — Encounter: Payer: Medicare Other | Attending: Endocrinology | Admitting: Dietician

## 2017-12-31 DIAGNOSIS — Z713 Dietary counseling and surveillance: Secondary | ICD-10-CM | POA: Diagnosis not present

## 2017-12-31 DIAGNOSIS — Z794 Long term (current) use of insulin: Secondary | ICD-10-CM | POA: Diagnosis not present

## 2017-12-31 DIAGNOSIS — IMO0002 Reserved for concepts with insufficient information to code with codable children: Secondary | ICD-10-CM

## 2017-12-31 DIAGNOSIS — E1165 Type 2 diabetes mellitus with hyperglycemia: Secondary | ICD-10-CM | POA: Insufficient documentation

## 2017-12-31 DIAGNOSIS — E118 Type 2 diabetes mellitus with unspecified complications: Secondary | ICD-10-CM

## 2017-12-31 NOTE — Progress Notes (Signed)
Diabetes Self-Management Education  Visit Type: First/Initial  Appt. Start Time: 0900 Appt. End Time: 1030  12/31/2017  [name], identified by name and date of birth, is a 77 y.o. male with a diagnosis of Diabetes: Type 2. Other history includes:  OSA on C-pap, HTN, hyperlipidemia, CHF, CVA, MI, CABG, 11 back surgeries due to degenerative joint disease, rhabdomyolysis.  Medications include: Tresiba 52 units q HS.   Humalog 25-30 units before each meal. Victoza  Patient is retired from [employer].  He lives with his wife and son and his wife does the sopping and cooking.  His wife helps him with diabetes management.  He notices that his am blood sugar is lower when he does not eat a big dessert after dinner.  Meals need more balance with protein and decrease carbohydrates.   ASSESSMENT  Height 5\' 9"  (1.753 m), weight 266 lb (120.7 kg). Body mass index is 39.28 kg/m.  Diabetes Self-Management Education - 12/31/17 0920      Visit Information   Visit Type  First/Initial      Initial Visit   Diabetes Type  Type 2    Are you currently following a meal plan?  Yes    What type of meal plan do you follow?  baked or grilled, more vegetables    Are you taking your medications as prescribed?  Yes    Date Diagnosed  1998 insulin since 2001      Health Coping   How would you rate your overall health?  Good      Psychosocial Assessment   Patient Belief/Attitude about Diabetes  Motivated to manage diabetes    Self-care barriers  None    Self-management support  Doctor's office;Family    Other persons present  Patient;Spouse/SO    Patient Concerns  Nutrition/Meal planning;Glycemic Control;Weight Control    Special Needs  None    Preferred Learning Style  No preference indicated    Learning Readiness  Ready    How often do you need to have someone help you when you read instructions, pamphlets, or other written materials from your doctor or pharmacy?  1 - Never    What  is the last grade level you completed in school?  12th grade      Pre-Education Assessment   Patient understands the diabetes disease and treatment process.  Needs Review    Patient understands incorporating nutritional management into lifestyle.  Needs Review    Patient undertands incorporating physical activity into lifestyle.  Needs Review    Patient understands using medications safely.  Needs Review    Patient understands monitoring blood glucose, interpreting and using results  Needs Review    Patient understands prevention, detection, and treatment of acute complications.  Needs Review    Patient understands prevention, detection, and treatment of chronic complications.  Needs Review    Patient understands how to develop strategies to address psychosocial issues.  Needs Review    Patient understands how to develop strategies to promote health/change behavior.  Needs Review      Complications   Last HgB A1C per patient/outside source  7.4 % 10/15/17    How often do you check your blood sugar?  3-4 times/day    Fasting Blood glucose range (mg/dL)  180-200    Postprandial Blood glucose range (mg/dL)  180-200    Number of hypoglycemic episodes per month  1    Can you tell when your blood sugar is low?  Yes  What do you do if your blood sugar is low?  eat chocolate    Number of hyperglycemic episodes per week  7    Can you tell when your blood sugar is high?  No    Have you had a dilated eye exam in the past 12 months?  No    Have you had a dental exam in the past 12 months?  Yes    Are you checking your feet?  Yes    How many days per week are you checking your feet?  7      Dietary Intake   Breakfast  [employer] toast (2 slices 45 cal), PB, 8 ounces chocolate fairlife milk (13 gm carbs), coffee, with sweet and low OR blueberry muffin, ch fairlife milk OR egg, 2 strips bacon, or sausage, 1 slice diet toast, occasional grits 8    Snack (morning)  none    Lunch  banana sandwich with or  without PB or ham sandwich or homemade PB crackers 12    Snack (afternoon)  none    Dinner  baked chicken, fish, pork chops, or hamburger steak, 1 small potato, 1/2 cup pintos or peas, occasional salad, cake or ice cream    Snack (evening)  fruit    Beverage(s)  diet coke, unsweetened tea with swt and low, water, occasional OJ, fairlife chocolate milk (13g carbs)      Exercise   Exercise Type  ADL's drop foot and unable to exercise much    How many days per week to you exercise?  0    How many minutes per day do you exercise?  0    Total minutes per week of exercise  0      Patient Education   Previous Diabetes Education  Yes (please comment) 07/2017 and several times prior    Nutrition management   Role of diet in the treatment of diabetes and the relationship between the three main macronutrients and blood glucose level;Meal timing in regards to the patients' current diabetes medication.;Meal options for control of blood glucose level and chronic complications.;Food label reading, portion sizes and measuring food.    Physical activity and exercise   Role of exercise on diabetes management, blood pressure control and cardiac health.;Helped patient identify appropriate exercises in relation to his/her diabetes, diabetes complications and other health issue.    Medications  Reviewed patients medication for diabetes, action, purpose, timing of dose and side effects.    Monitoring  Purpose and frequency of SMBG.;Identified appropriate SMBG and/or A1C goals.;Daily foot exams;Yearly dilated eye exam    Acute complications  Taught treatment of hypoglycemia - the 15 rule.    Chronic complications  Relationship between chronic complications and blood glucose control    Psychosocial adjustment  Worked with patient to identify barriers to care and solutions;Role of stress on diabetes    Personal strategies to promote health  Lifestyle issues that need to be addressed for better diabetes care       Individualized Goals (developed by patient)   Nutrition  General guidelines for healthy choices and portions discussed    Physical Activity  Not Applicable    Medications  take my medication as prescribed    Monitoring   test my blood glucose as discussed    Reducing Risk  examine blood glucose patterns    Health Coping  discuss diabetes with (comment) MD, RD, CDE      Post-Education Assessment   Patient understands the diabetes disease and treatment  process.  Demonstrates understanding / competency    Patient understands incorporating nutritional management into lifestyle.  Demonstrates understanding / competency    Patient undertands incorporating physical activity into lifestyle.  Demonstrates understanding / competency    Patient understands using medications safely.  Demonstrates understanding / competency    Patient understands monitoring blood glucose, interpreting and using results  Demonstrates understanding / competency    Patient understands prevention, detection, and treatment of acute complications.  Demonstrates understanding / competency    Patient understands prevention, detection, and treatment of chronic complications.  Demonstrates understanding / competency    Patient understands how to develop strategies to address psychosocial issues.  Demonstrates understanding / competency    Patient understands how to develop strategies to promote health/change behavior.  Demonstrates understanding / competency      Outcomes   Expected Outcomes  Demonstrated interest in learning. Expect positive outcomes    Future DMSE  PRN    Program Status  Completed       Individualized Plan for Diabetes Self-Management Training:   Learning Objective:  Patient will have a greater understanding of diabetes self-management. Patient education plan is to attend individual and/or group sessions per assessed needs and concerns.   Plan:   Patient Instructions  - Check your blood sugar 2 hours  after you start a meal more frequently.  Especially do this after you eat out and compare to other meals eaten at home. - Rethink your desserts.  Consider small piece of dark chocolate or sugar free jello with fruit or portion controlled dessert occasionally when less carbohydrates are eaten with a meal. - Continue to bake, broil, or grill - Continue to cook without salt.  Recommend changing the ham to something lower sodium. - Consider adding a fruit to lunch to balance out the carbohydrate. - Consider alternatives to the blueberry muffin the the am such as peanut butter toast and fruit. - Consider physical therapy or armchair exercises. - Consider reading labels - Add small portion of protein with breakfast and evening snack.   Aim for 3-4 Carb Choices per meal (45-60 grams) +/- 1 either way  Aim for 0-1 Carbs per snack if hungry  Include protein in moderation with your meals and snacks      Expected Outcomes:  Demonstrated interest in learning. Expect positive outcomes  Education material provided: Food label handouts, A1C conversion sheet, Meal plan card, My Plate and Snack sheet, Freestyle Libre pamphlet  If problems or questions, patient to contact team via:  Phone  Future DSME appointment: PRN

## 2017-12-31 NOTE — Patient Instructions (Signed)
-   Check your blood sugar 2 hours after you start a meal more frequently.  Especially do this after you eat out and compare to other meals eaten at home. - Rethink your desserts.  Consider small piece of dark chocolate or sugar free jello with fruit or portion controlled dessert occasionally when less carbohydrates are eaten with a meal. - Continue to bake, broil, or grill - Continue to cook without salt.  Recommend changing the ham to something lower sodium. - Consider adding a fruit to lunch to balance out the carbohydrate. - Consider alternatives to the blueberry muffin the the am such as peanut butter toast and fruit. - Consider physical therapy or armchair exercises. - Consider reading labels - Add small portion of protein with breakfast and evening snack.   Aim for 3-4 Carb Choices per meal (45-60 grams) +/- 1 either way  Aim for 0-1 Carbs per snack if hungry  Include protein in moderation with your meals and snacks

## 2018-01-06 DIAGNOSIS — G4733 Obstructive sleep apnea (adult) (pediatric): Secondary | ICD-10-CM | POA: Diagnosis not present

## 2018-01-14 ENCOUNTER — Telehealth: Payer: Self-pay | Admitting: Endocrinology

## 2018-01-14 NOTE — Telephone Encounter (Signed)
Pharmacy stated they need a  Dx code for insulin lispro (HUMALOG) 100 UNIT/ML injection [[id number]]  Send to  Pharmacy:  [place], [place] #:  [id number]

## 2018-01-17 MED ORDER — INSULIN LISPRO 100 UNIT/ML ~~LOC~~ SOLN: SUBCUTANEOUS | 4 refills | Status: DC

## 2018-01-17 NOTE — Telephone Encounter (Signed)
I have resent this prescription for Humalog to the [facility].

## 2018-01-17 NOTE — Telephone Encounter (Signed)
Pt states that script could not be filled because the pharmacy could not tell how he was suppose to take it. There was two different instructions on the script and pharmacy needed to know which one pt is suppose to be doing.      [facility], [address] AT [address] [phone] (Phone) [phone] (Fax)

## 2018-01-21 DIAGNOSIS — H524 Presbyopia: Secondary | ICD-10-CM | POA: Diagnosis not present

## 2018-01-21 DIAGNOSIS — H5213 Myopia, bilateral: Secondary | ICD-10-CM | POA: Diagnosis not present

## 2018-01-21 DIAGNOSIS — E119 Type 2 diabetes mellitus without complications: Secondary | ICD-10-CM | POA: Diagnosis not present

## 2018-01-21 DIAGNOSIS — Z794 Long term (current) use of insulin: Secondary | ICD-10-CM | POA: Diagnosis not present

## 2018-01-24 ENCOUNTER — Other Ambulatory Visit: Payer: Self-pay | Admitting: Endocrinology

## 2018-01-27 DIAGNOSIS — M150 Primary generalized (osteo)arthritis: Secondary | ICD-10-CM | POA: Diagnosis not present

## 2018-01-27 DIAGNOSIS — M5136 Other intervertebral disc degeneration, lumbar region: Secondary | ICD-10-CM | POA: Diagnosis not present

## 2018-01-27 DIAGNOSIS — Z6837 Body mass index (BMI) 37.0-37.9, adult: Secondary | ICD-10-CM | POA: Diagnosis not present

## 2018-01-27 DIAGNOSIS — M1A09X0 Idiopathic chronic gout, multiple sites, without tophus (tophi): Secondary | ICD-10-CM | POA: Diagnosis not present

## 2018-02-15 ENCOUNTER — Other Ambulatory Visit (INDEPENDENT_AMBULATORY_CARE_PROVIDER_SITE_OTHER): Payer: Medicare Other

## 2018-02-15 DIAGNOSIS — E1165 Type 2 diabetes mellitus with hyperglycemia: Secondary | ICD-10-CM

## 2018-02-15 DIAGNOSIS — Z794 Long term (current) use of insulin: Secondary | ICD-10-CM | POA: Diagnosis not present

## 2018-02-15 LAB — LIPID PANEL
Cholesterol: 96 mg/dL (ref 0–200)
HDL: 37.6 mg/dL — ABNORMAL LOW (ref >39.00)
LDL Cholesterol: 40 mg/dL (ref 0–99)
NonHDL: 58.1
Total CHOL/HDL Ratio: 3
Triglycerides: 93 mg/dL (ref 0.0–149.0)
VLDL: 18.6 mg/dL (ref 0.0–40.0)

## 2018-02-15 LAB — BASIC METABOLIC PANEL
BUN: 18 mg/dL (ref 6–23)
CO2: 32 mEq/L (ref 19–32)
Calcium: 9.5 mg/dL (ref 8.4–10.5)
Chloride: 104 mEq/L (ref 96–112)
Creatinine, Ser: 1.31 mg/dL (ref 0.40–1.50)
GFR: 56.38 mL/min — ABNORMAL LOW (ref >60.00)
Glucose, Bld: 103 mg/dL — ABNORMAL HIGH (ref 70–99)
Potassium: 3.9 mEq/L (ref 3.5–5.1)
Sodium: 142 mEq/L (ref 135–145)

## 2018-02-15 LAB — HEMOGLOBIN A1C: Hgb A1c MFr Bld: 7.1 % — ABNORMAL HIGH (ref 4.6–6.5)

## 2018-02-17 ENCOUNTER — Encounter: Payer: Self-pay | Admitting: Endocrinology

## 2018-02-17 ENCOUNTER — Ambulatory Visit: Payer: Medicare Other | Admitting: Endocrinology

## 2018-02-17 VITALS — BP 114/62 | HR 71 | Wt 263.0 lb

## 2018-02-17 DIAGNOSIS — I2510 Atherosclerotic heart disease of native coronary artery without angina pectoris: Secondary | ICD-10-CM | POA: Diagnosis not present

## 2018-02-17 DIAGNOSIS — E1165 Type 2 diabetes mellitus with hyperglycemia: Secondary | ICD-10-CM | POA: Diagnosis not present

## 2018-02-17 DIAGNOSIS — E039 Hypothyroidism, unspecified: Secondary | ICD-10-CM | POA: Diagnosis not present

## 2018-02-17 DIAGNOSIS — E119 Type 2 diabetes mellitus without complications: Secondary | ICD-10-CM | POA: Diagnosis not present

## 2018-02-17 DIAGNOSIS — Z794 Long term (current) use of insulin: Secondary | ICD-10-CM | POA: Diagnosis not present

## 2018-02-17 DIAGNOSIS — I10 Essential (primary) hypertension: Secondary | ICD-10-CM | POA: Diagnosis not present

## 2018-02-17 NOTE — Progress Notes (Addendum)
Patient ID: Randall Wilkins, male   DOB: Apr 04, 1941, 77 y.o.   MRN: [id number]           Reason for Appointment: Follow-up for Type 2 Diabetes   History of Present Illness:          Date of diagnosis of type 2 diabetes mellitus:   1998       Background history:   Has been on insulin since about 2001 He thinks he was given metformin only for a month and may have stopped because of side effects, does not remember specifics. Also Actos was tried and not clear what side effects he had Does not remember other medication he may have tried but has not tried Victoza or Byetta   Recent history:   INSULIN regimen is:   [place] 54 units at around 8 PM .  Humalog 25-30 units 3 times a day ac-pc     Non-insulin hypoglycemic drugs the patient is taking are: Victoza 1.2 mg daily  His last A1c is 7.4, previously 7.1 but has been as high as 8.1  Fructosamine is 252, previously better  Current management, blood sugar patterns and problems identified:  He has taken relatively higher dose of [place]  However not sure why his blood sugars are fluctuating in the morning and although they were improving they are mostly high again recently  He has talked to the dietitian and is trying to make changes  Also has lost 3 pounds  His blood sugars after evening meal are quite variable and tend to be higher when he has no carbohydrate  Also after lunch his blood sugars are usually much lower than any other meal even with taking less insulin; he is either eating a sandwich or cottage cheese and fruit  His wife says that if he is eating starchy foods like potatoes or corn bread his blood sugars will go up significantly but he only takes up to 30 units of Humalog at suppertime regardless of what he is eating  Blood sugars at lunchtime are also variable based on his carbohydrate content of breakfast  He is compliant with his Victoza        Side effects from medications have been: Unknown  Compliance  with the medical regimen: Fair  Glucose monitoring:  done 3 times a day         Glucometer:  contour     Blood Glucose readings by DOWNLOAD:   Mean values apply above for all meters except median for One Touch  PRE-MEAL Fasting Lunch Dinner Bedtime Overall  Glucose range:  113-205      Mean/median:  168  158    157   POST-MEAL PC Breakfast PC Lunch PC Dinner  Glucose range:   81  76-268  Mean/median:   67-111 164    Previous blood sugar range:  Mean values apply above for all meters except median for One Touch  PRE-MEAL Fasting Lunch Dinner Bedtime Overall  Glucose range: 150-298    97-302    Mean/median: 201   186     POST-MEAL PC Breakfast PC Lunch PC Dinner  Glucose range:     Mean/median: 191       Self-care:  Meal times are:  Breakfast is at 8 a.m., lunch: 12 noon Dinner: 6 PM   Typical meal intake: Breakfast is usually egg or peanut butter sandwich   lunch is a sandwich, dinner is meat and vegetables/solid    Snacks: Sugar-free Jell-O or pudding, crackers  Dietician visit, most recent: 12/2017               CDE 8/18  Exercise:  unable to do any  Weight history: Range 252-280 previously  Wt Readings from Last 3 Encounters:  02/17/18 263 lb (119.3 kg)  12/31/17 266 lb (120.7 kg)  12/22/17 267 lb 8 oz (121.3 kg)    Glycemic control:   Lab Results  Component Value Date   HGBA1C 7.1 (H) 02/15/2018   HGBA1C 7.4 (H) 10/15/2017   HGBA1C 7.1 07/22/2017   Lab Results  Component Value Date   MICROALBUR 20.0 (H) 12/17/2017   LDLCALC 40 02/15/2018   CREATININE 1.31 02/15/2018   Lab Results  Component Value Date   MICRALBCREAT 27.7 12/17/2017    Lab Results  Component Value Date   FRUCTOSAMINE 252 12/17/2017   FRUCTOSAMINE 233 08/09/2017      Allergies as of 02/17/2018      Reactions   Actos [pioglitazone] Swelling   Metformin And Related Nausea Only   Niaspan [niacin Er] Itching, Rash      Medication List        Accurate as of  02/17/18 11:19 AM. Always use your most recent med list.          allopurinol 300 MG tablet Commonly known as:  ZYLOPRIM Take 1 tablet by mouth daily. Take 1 tab by my mouth daily   amitriptyline 75 MG tablet Commonly known as:  ELAVIL Take 1 tablet (75 mg total) by mouth daily.   aspirin EC 81 MG tablet Take 1 tablet (81 mg total) by mouth daily.   colchicine 0.6 MG tablet Take 0.6 mg by mouth daily as needed.   CONTOUR NEXT MONITOR w/Device Kit 1 Device by Does not apply route 3 (three) times daily. Use to check blood sugars 3 times daily. Dx Code E13.9   donepezil 10 MG tablet Commonly known as:  ARICEPT TAKE 1 TABLET BY MOUTH DAILY   glucose blood test strip Commonly known as:  CONTOUR NEXT TEST Use to check blood sugars 4 times daily. Dx Code E13.9   Insulin Degludec 200 UNIT/ML Sopn Commonly known as:  TRESIBA FLEXTOUCH Inject 48 Units daily into the skin.   insulin lispro 100 UNIT/ML injection Commonly known as:  HUMALOG Inject 25-30u in am, 30u at lunch, 30u in evening   levothyroxine 75 MCG tablet Commonly known as:  SYNTHROID, LEVOTHROID   lisinopril 5 MG tablet Commonly known as:  PRINIVIL,ZESTRIL Take 1 tablet (5 mg total) by mouth daily.   metoprolol succinate 25 MG 24 hr tablet Commonly known as:  TOPROL-XL Take 1.5 tablets (37.5 mg total) by mouth daily.   NON FORMULARY Inhale 1 application into the lungs at bedtime. CPAP   potassium chloride SA 20 MEQ tablet Commonly known as:  K-DUR,KLOR-CON Take 20 mEq by mouth 2 (two) times daily.   rosuvastatin 20 MG tablet Commonly known as:  CRESTOR Take 20 mg by mouth daily.   tamsulosin 0.4 MG Caps capsule Commonly known as:  FLOMAX Take 0.4 mg by mouth daily.   torsemide 10 MG tablet Commonly known as:  DEMADEX Take 60m by mouth alternating with 1042m   VICTOZA 18 MG/3ML Sopn Generic drug:  liraglutide INJECT 0.2MLS INTO THE SKIN DAILY       Allergies:  Allergies  Allergen Reactions   . Actos [Pioglitazone] Swelling  . Metformin And Related Nausea Only  . Niaspan [Niacin Er] Itching and Rash    Past Medical History:  Diagnosis Date  . Aortic stenosis, mild 11/14/2013  . Aortic valve sclerosis 03/29/2015  . Bilateral leg edema 05/21/2014  . CAD (coronary artery disease)   . Chronic diastolic congestive heart failure ([place])   . Chronic kidney disease (CKD), stage III (moderate) (HCC)   . Diabetes 1.5, managed as type 1 ([place]) 02/04/2013  . Diabetic peripheral neuropathy associated with type 1 diabetes mellitus ([place]) 11/14/2013  . Dysphagia   . Dyspnea on exertion 03/21/2015  . Exogenous obesity   . Gout   . Heart attack ([place])   . History of nuclear stress test 07/2011   dipyridamole; fixed inferolateral defect, worse at stress than rest; no reversible ischemia; low risk scan   . Hyperlipidemia   . Hypertension   . Insulin dependent diabetes mellitus ([place])   . Left foot drop   . Left main coronary artery disease 03/28/2015  . Memory loss   . Obesity (BMI 30-39.9) 11/14/2013  . Obstructive sleep apnea 03/21/2015  . OSA on CPAP    uses a cpap  . Peptic ulcer with hemorrhage 03/28/2015  . Peripheral neuropathy   . Rhabdomyolysis   . 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 Stroke ([place])    L patietal with small scattered lacunar infarcts  . Thrombocytopenia ([place]) 03/21/2015  . Venous insufficiency   . Weakness generalized 03/21/2015    Past Surgical History:  Procedure Laterality Date  . BACK SURGERY  2002   lumbosacral  . Carotid Doppler  03/2013   bilat bulb/prox ICAs - mild amount of fibrous plaque with no evidence of diameter reduction  . CARPAL TUNNEL RELEASE Bilateral 08/09/2014   Procedure: BILATERAL CARPAL TUNNEL RELEASE;  Surgeon: Daryll Brod, MD;  Location: [facility];  Service: Orthopedics;  Laterality: Bilateral;  ANESTHESIA:  IV REGIONAL BIL FAB  . CHOLECYSTECTOMY    . COLONOSCOPY    . CORONARY ANGIOPLASTY   10/13/1996  . CORONARY ANGIOPLASTY  09/21/1989   emergency PTCA  . CORONARY ANGIOPLASTY  10/13/1996   Multi-Link diagonal & OD stenting (Dr. Marella Chimes)  . CORONARY ANGIOPLASTY  12/03/1997   disease of mid DX-1 ~50% & in mid PLA & PDA (distal lesions) (Dr. Marella Chimes)   . CORONARY ANGIOPLASTY  10/14/1999   progression of disease distal PLA & PDA; progression of disease prox RCA - moderate (Dr. Marella Chimes)   . CORONARY ANGIOPLASTY WITH STENT PLACEMENT  04/04/2004   4.0x35m non-DES (thrombectomy via AngioJet) to RCA for high grade stenosis (Dr. RMarella Chimes  . CORONARY ARTERY BYPASS GRAFT N/A 03/29/2015   Procedure: CORONARY ARTERY BYPASS GRAFTING TIMES TWO USING LEFT INTERNAL MAMMARY ARTERY AND RIGHT LEG GREATER SAPHENOUS VEIN HARVESTED ENDOSCOPICALLY.;  Surgeon: CRexene Alberts MD;  [facility]  Service: Open Heart Surgery;  Laterality: N/A;  . ESOPHAGOGASTRODUODENOSCOPY N/A 03/27/2015   Procedure: ESOPHAGOGASTRODUODENOSCOPY (EGD);  Surgeon: MClarene Essex MD;  [facility]  Service: Endoscopy;  Laterality: N/A;  possible dilation  . LEFT HEART CATHETERIZATION WITH CORONARY ANGIOGRAM N/A 03/28/2015   Procedure: LEFT HEART CATHETERIZATION WITH CORONARY ANGIOGRAM;  Surgeon: Peter M JMartinique MD;  [facility];  Service: Cardiovascular;  Laterality: N/A;  . ROTATOR CUFF REPAIR Right   . SINUS ENDO W/FUSION    . TONSILLECTOMY    . TRANSTHORACIC ECHOCARDIOGRAM  08/08/2013   EF 55-60%, mild conc hypertrophy, grade 1 diastolic dysfunction; AV with mild stenosis; LA & RA mildly dilated  Family History  Problem Relation Age of Onset  . Heart disease Mother   . Coronary artery disease Father   . Cancer Maternal Grandmother   . Heart Problems Maternal Grandfather     Social History:  reports that he quit smoking about 45 years ago. His smoking use included cigarettes. He has a 40.00 pack-year smoking history. he has never used smokeless tobacco. He reports that he does  not drink alcohol or use drugs.   Review of Systems   Lipid history: Last LDL was 54 in March 2018, currently on 20 mg rosuvastatin, followed by PCP    Lab Results  Component Value Date   CHOL 96 02/15/2018   HDL 37.60 (L) 02/15/2018   LDLCALC 40 02/15/2018   TRIG 93.0 02/15/2018   CHOLHDL 3 02/15/2018           Hypertension: Relatively mild and controlled with the 5 mg lisinopril and metoprolol   Most recent eye exam was in 2017, Dr. Gershon Crane and reportedly no retinopathy  Most recent foot exam: 6/33  Complications of diabetes: Mark peripheral neuropathy with sensory loss.   LABS:  Lab on 02/15/2018  Component Date Value Ref Range Status  . Cholesterol 02/15/2018 96  0 - 200 mg/dL Final   ATP III Classification       Desirable:  < 200 mg/dL               Borderline High:  200 - 239 mg/dL          High:  > = 240 mg/dL  . Triglycerides 02/15/2018 93.0  0.0 - 149.0 mg/dL Final   Normal:  <150 mg/dLBorderline High:  150 - 199 mg/dL  . HDL 02/15/2018 37.60* >39.00 mg/dL Final  . VLDL 02/15/2018 18.6  0.0 - 40.0 mg/dL Final  . LDL Cholesterol 02/15/2018 40  0 - 99 mg/dL Final  . Total CHOL/HDL Ratio 02/15/2018 3   Final                  Men          Women1/2 Average Risk     3.4          3.3Average Risk          5.0          4.42X Average Risk          9.6          7.13X Average Risk          15.0          11.0                      . NonHDL 02/15/2018 58.10   Final   NOTE:  Non-HDL goal should be 30 mg/dL higher than patient's LDL goal (i.e. LDL goal of < 70 mg/dL, would have non-HDL goal of < 100 mg/dL)  . Sodium 02/15/2018 142  135 - 145 mEq/L Final  . Potassium 02/15/2018 3.9  3.5 - 5.1 mEq/L Final  . Chloride 02/15/2018 104  96 - 112 mEq/L Final  . CO2 02/15/2018 32  19 - 32 mEq/L Final  . Glucose, Bld 02/15/2018 103* 70 - 99 mg/dL Final  . BUN 02/15/2018 18  6 - 23 mg/dL Final  . Creatinine, Ser 02/15/2018 1.31  0.40 - 1.50 mg/dL Final  . Calcium 02/15/2018 9.5  8.4 -  10.5 mg/dL Final  . GFR 02/15/2018 56.38* >60.00 mL/min Final  .  Hgb A1c MFr Bld 02/15/2018 7.1* 4.6 - 6.5 % Final   Glycemic Control Guidelines for People with Diabetes:Non Diabetic:  <6%Goal of Therapy: <7%Additional Action Suggested:  >8%     Physical Examination:  BP 114/62 (BP Location: Left Arm, Patient Position: Sitting, Cuff Size: Large)   Pulse 71   Wt 263 lb (119.3 kg)   SpO2 94%   BMI 38.84 kg/m      ASSESSMENT:  Diabetes type 2, uncontrolled, insulin-dependent with obesity and history of neuropathy  See history of present illness for detailed discussion of current diabetes management, blood sugar patterns and problems identified  His blood sugars are overall improving However A1c is somewhat higher than expected for his home readings He has difficulty with high readings after mostly evening meal and sometimes after breakfast along with overnight hypoglycemia  Recently with trying to improve his diet and working with the dietitian he is having less tendency to hypoglycemia However even with Victoza he is still requiring significant amount of mealtime insulin coverage Not clear why his fasting readings have started to go up in the last 10 days even though they were near normal late last month   PLAN:    He will increase his [place] to 56 at least and more fasting readings are consistently high  He probably needs to take 35 units for his dinner if he is eating any carbohydrate  Otherwise he can cut down 5 units on his lunchtime dose, based on what he is eating Consider increasing Victoza to 1.8  Patient Instructions  Take 15 Units Humalog at lunch, 20 for sandwich,   Humalog BEFORE EATING  Tresiba 56 units          Aramis Zobel 02/17/2018, 11:19 AM   Note: This office note was prepared with Dragon voice recognition system technology. Any transcriptional errors that result from this process are unintentional.

## 2018-02-17 NOTE — Patient Instructions (Addendum)
Take 15 Units Humalog at lunch, 20 for sandwich,   Humalog BEFORE EATING  Tresiba 56 units

## 2018-03-06 ENCOUNTER — Other Ambulatory Visit: Payer: Self-pay | Admitting: Endocrinology

## 2018-03-07 ENCOUNTER — Other Ambulatory Visit: Payer: Self-pay | Admitting: *Deleted

## 2018-03-07 ENCOUNTER — Other Ambulatory Visit: Payer: Self-pay

## 2018-03-07 MED ORDER — GLUCOSE BLOOD VI STRP: ORAL_STRIP | 3 refills | Status: DC

## 2018-03-07 MED ORDER — LISINOPRIL 5 MG PO TABS: 5.0000 mg | ORAL_TABLET | Freq: Every day | ORAL | 0 refills | Status: DC

## 2018-03-11 ENCOUNTER — Telehealth: Payer: Self-pay | Admitting: Endocrinology

## 2018-03-11 NOTE — Telephone Encounter (Signed)
glucose blood (CONTOUR NEXT TEST) test strip  Patients wife stated she took patients prescription to the pharmacy and they stated the prescription needed a Dx code before they could refill it. Stated that the pharmacy has reached out to our office and has not received anything back from [place],   Please advise    [place], [place] - [address]

## 2018-03-14 ENCOUNTER — Telehealth: Payer: Self-pay | Admitting: Endocrinology

## 2018-03-14 NOTE — Telephone Encounter (Signed)
Patient need dx code for his test strips Original Order:  CONTOUR NEXT TEST test strip [[id number]]    Pharmacy:  [facility] [address] [place] - [address] DEA #:  --

## 2018-03-15 ENCOUNTER — Other Ambulatory Visit: Payer: Self-pay

## 2018-03-15 MED ORDER — GLUCOSE BLOOD VI STRP: ORAL_STRIP | 3 refills | Status: DC

## 2018-03-15 NOTE — Telephone Encounter (Signed)
This has been done.

## 2018-03-16 DIAGNOSIS — E109 Type 1 diabetes mellitus without complications: Secondary | ICD-10-CM | POA: Diagnosis not present

## 2018-03-16 NOTE — Telephone Encounter (Signed)
Done on 03/15/18. See meds.

## 2018-03-19 ENCOUNTER — Other Ambulatory Visit: Payer: Self-pay | Admitting: Internal Medicine

## 2018-03-23 ENCOUNTER — Encounter: Payer: Self-pay | Admitting: Internal Medicine

## 2018-03-23 ENCOUNTER — Ambulatory Visit: Payer: Medicare Other | Admitting: Internal Medicine

## 2018-03-23 VITALS — BP 110/60 | HR 61 | Ht 69.0 in | Wt 260.4 lb

## 2018-03-23 DIAGNOSIS — Z9861 Coronary angioplasty status: Secondary | ICD-10-CM | POA: Diagnosis not present

## 2018-03-23 DIAGNOSIS — I350 Nonrheumatic aortic (valve) stenosis: Secondary | ICD-10-CM | POA: Diagnosis not present

## 2018-03-23 DIAGNOSIS — E785 Hyperlipidemia, unspecified: Secondary | ICD-10-CM | POA: Diagnosis not present

## 2018-03-23 DIAGNOSIS — I10 Essential (primary) hypertension: Secondary | ICD-10-CM

## 2018-03-23 DIAGNOSIS — I2510 Atherosclerotic heart disease of native coronary artery without angina pectoris: Secondary | ICD-10-CM

## 2018-03-23 NOTE — Patient Instructions (Signed)
Your physician wants you to follow-up in: ONE YEAR with Dr. Hilty. You will receive a reminder letter in the mail two months in advance. If you don't receive a letter, please call our office to schedule the follow-up appointment.  

## 2018-03-23 NOTE — Progress Notes (Signed)
OFFICE NOTE  Chief Complaint:  No complaints  Primary Care Physician: [name], MD  HPI:  Randall Wilkins is a pleasant 77 year old male patient who was formerly followed by Dr. Rollene Fare. He is here to establish cardiac care with [place] today. He has a history of obesity, multiple comorbidities including insulin-dependent diabetes, diabetic peripheral neuropathy, diabetic nephropathy, venous insufficiency and peripheral neuropathy. He also has obstructive sleep apnea on CPAP and coronary artery disease. He unfortunately suffered a left parietal stroke with small lacunar infarcts noted on MRI in 2013. He is a left foot drop from an L4/L5 neuropathy and wears a brace for this. From a cardiac standpoint he had a large non-drug-eluting stent placed in the RCA in 2005. He has had a stent placed at 97 to a diagonal which was noted to be patent. He's had some problems with dizziness which is improved. He [place] as large to medium secondary to neuropathy and venous insufficiency. Unfortunately not been able to lose a lot of weight due to his difficulty in ambulating and painful neuropathy.  He denies any cardiac chest pain or worsening shortness of breath.  Randall Wilkins returns today for a six-month followup. He is having no complaints. He does report some lower extremity swelling which is stable. He has mild aortic stenosis which is been stable as well. His last echo was in August 2014. He denies any chest pain or worsening shortness of breath. He recently had lab work to Dr. Eugenio Hoes office which we will obtain.  I saw Randall Wilkins back today for hospital follow-up. He is reportedly doing much better. He has more energy and no significant chest discomfort. Between our last appointment see presented with a urinary tract infection and ultimately had chest pain and profuse diaphoresis. He was found to have elevated troponin and underwent cardiac catheterization. This demonstrated the following:  Coronary  angiography: Coronary dominance: right  Left mainstem: 95% distal left main stenosis.  Left anterior descending (LAD): There is 95% ostial LAD stenosis. The LAD is a large vessel. The LAD and diagonal otherwise are without significant disease.  There is a large ramus intermediate branch with 99% ostial stenosis.  Left circumflex (LCx): 100% ostial occlusion with faint left to left collaterals.  Right coronary artery (RCA): the RCA is a large dominant branch. The stent in the proximal vessel is widely patent. There is a 60% stenosis in the mid PDA. The third PLOM is moderate in size with a long 90-95% stenosis prior to distal arborization.   Subsequently he underwent emergent coronary artery bypass grafting with a LIMA to LAD and SVG to OM branch. He is noted to have mild aortic sclerosis versus stenosis, however the valve was not severe enough to be addressed. Since discharge he is more active and is starting cardiac rehabilitation today.  Randall Wilkins returns today for follow-up. Overall he is feeling well and continues to improve in his ambulation and stamina. He denies any chest pain or shortness of breath. He is rehabilitating well. Recent laboratory work shows excellent cholesterol control and total cholesterol 130, HDL 42 LDL 61 and triglycerides 137. His only concern is the cost of medications.  I saw Randall Wilkins back today in the office. He denies any chest pain or worsening shortness of breath. Unfortunately he's had about several weeks of diarrhea which started when he went to the [facility] area. It was not felt to be infectious any seeing a gastroenterologist about this. Otherwise he is without cardiac  complaints. Blood pressure today was low at 114/52. He's managed to lose only a couple of pounds but generally his appetite stools been pretty good despite the diarrhea. He is planning on having an upcoming injection and I received a request to hold his Plavix for 5 days prior to that. He  is currently holding it will have his injection on Monday. Based on this I reviewed his records and I do not see a clear ongoing indication for the Plavix in addition to aspirin. Now that he's been surgically revascularized.   03/04/2017  Randall Wilkins returns today for follow-up. Recently he's had some dizziness and apparently had lower blood pressure. He backed off on his lisinopril to 2.5 mg daily, or at least he says he is taking a half tablet. Will try to figure out what the dose was. EKG today shows sinus bradycardia with either a first degree AV block of great significance or possibly atrial fibrillation. It's difficult to see consistent P waves with normal PP intervals. The RR intervals are slightly irregular. He has had sinus with a long first-degree AV block of 340 ms in the past. This is important to determine as he has had prior stroke and would necessitate anticoagulation.  03/23/2018  Randall Wilkins returns today for follow-up.  He has no specific complaints.  He continues to have a little instability with his gait.  He is using a cane today but has a walker at home.  Recently he has had the best blood sugars that he has had in a while.  He started seeing Dr. Dwyane Dee, who added Victoza.  He is also on [place] and Humalog.  He was in a clinical trial with Jardiance, and is to be considered given his history of coronary disease and diabetes for mortality benefit.  I will defer to Dr. Dwyane Dee.  Blood pressure is well controlled today.  Weight remains excessive.  EKG personally reviewed shows sinus rhythm with PVCs for which he is unaware of.  PMHx:  Past Medical History:  Diagnosis Date  . Aortic stenosis, mild 11/14/2013  . Aortic valve sclerosis 03/29/2015  . Bilateral leg edema 05/21/2014  . CAD (coronary artery disease)   . Chronic diastolic congestive heart failure ([place])   . Chronic kidney disease (CKD), stage III (moderate) (HCC)   . Diabetes 1.5, managed as type 1 ([place]) 02/04/2013  . Diabetic  peripheral neuropathy associated with type 1 diabetes mellitus ([place]) 11/14/2013  . Dysphagia   . Dyspnea on exertion 03/21/2015  . Exogenous obesity   . Gout   . Heart attack ([place])   . History of nuclear stress test 07/2011   dipyridamole; fixed inferolateral defect, worse at stress than rest; no reversible ischemia; low risk scan   . Hyperlipidemia   . Hypertension   . Insulin dependent diabetes mellitus ([place])   . Left foot drop   . Left main coronary artery disease 03/28/2015  . Memory loss   . Obesity (BMI 30-39.9) 11/14/2013  . Obstructive sleep apnea 03/21/2015  . OSA on CPAP    uses a cpap  . Peptic ulcer with hemorrhage 03/28/2015  . Peripheral neuropathy   . Rhabdomyolysis   . 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 Stroke ([place])    L patietal with small scattered lacunar infarcts  . Thrombocytopenia ([place]) 03/21/2015  . Venous insufficiency   . Weakness generalized 03/21/2015    Past Surgical History:  Procedure Laterality  Date  . BACK SURGERY  2002   lumbosacral  . Carotid Doppler  03/2013   bilat bulb/prox ICAs - mild amount of fibrous plaque with no evidence of diameter reduction  . CARPAL TUNNEL RELEASE Bilateral 08/09/2014   Procedure: BILATERAL CARPAL TUNNEL RELEASE;  Surgeon: Daryll Brod, MD;  Location: [facility];  Service: Orthopedics;  Laterality: Bilateral;  ANESTHESIA:  IV REGIONAL BIL FAB  . CHOLECYSTECTOMY    . COLONOSCOPY    . CORONARY ANGIOPLASTY  10/13/1996  . CORONARY ANGIOPLASTY  09/21/1989   emergency PTCA  . CORONARY ANGIOPLASTY  10/13/1996   Multi-Link diagonal & OD stenting (Dr. Marella Chimes)  . CORONARY ANGIOPLASTY  12/03/1997   disease of mid DX-1 ~50% & in mid PLA & PDA (distal lesions) (Dr. Marella Chimes)   . CORONARY ANGIOPLASTY  10/14/1999   progression of disease distal PLA & PDA; progression of disease prox RCA - moderate (Dr. Marella Chimes)   . CORONARY ANGIOPLASTY WITH STENT PLACEMENT  04/04/2004     4.0x30m non-DES (thrombectomy via AngioJet) to RCA for high grade stenosis (Dr. RMarella Chimes  . CORONARY ARTERY BYPASS GRAFT N/A 03/29/2015   Procedure: CORONARY ARTERY BYPASS GRAFTING TIMES TWO USING LEFT INTERNAL MAMMARY ARTERY AND RIGHT LEG GREATER SAPHENOUS VEIN HARVESTED ENDOSCOPICALLY.;  Surgeon: CRexene Alberts MD;  [place] [facility]  Service: Open Heart Surgery;  Laterality: N/A;  . ESOPHAGOGASTRODUODENOSCOPY N/A 03/27/2015   Procedure: ESOPHAGOGASTRODUODENOSCOPY (EGD);  Surgeon: MClarene Essex MD;  [facility]  Service: Endoscopy;  Laterality: N/A;  possible dilation  . LEFT HEART CATHETERIZATION WITH CORONARY ANGIOGRAM N/A 03/28/2015   Procedure: LEFT HEART CATHETERIZATION WITH CORONARY ANGIOGRAM;  Surgeon: Peter M JMartinique MD;  [facility];  Service: Cardiovascular;  Laterality: N/A;  . ROTATOR CUFF REPAIR Right   . SINUS ENDO W/FUSION    . TONSILLECTOMY    . TRANSTHORACIC ECHOCARDIOGRAM  08/08/2013   EF 55-60%, mild conc hypertrophy, grade 1 diastolic dysfunction; AV with mild stenosis; LA & RA mildly dilated    FAMHx:  Family History  Problem Relation Age of Onset  . Heart disease Mother   . Coronary artery disease Father   . Cancer Maternal Grandmother   . Heart Problems Maternal Grandfather     SOCHx:   reports that he quit smoking about 45 years ago. His smoking use included cigarettes. He has a 40.00 pack-year smoking history. He has never used smokeless tobacco. He reports that he does not drink alcohol or use drugs.  ALLERGIES:  Allergies  Allergen Reactions  . Actos [Pioglitazone] Swelling  . Metformin And Related Nausea Only  . Niaspan [Niacin Er] Itching and Rash    ROS: Pertinent items noted in HPI and remainder of comprehensive ROS otherwise negative.  HOME MEDS: Current Outpatient Medications  Medication Sig Dispense Refill  . allopurinol (ZYLOPRIM) 300 MG tablet Take 1 tablet by mouth daily. Take 1 tab by my mouth daily  2  .  amitriptyline (ELAVIL) 50 MG tablet Take 50 mg by mouth daily.    .Marland Kitchenaspirin 81 MG tablet Take 1 tablet (81 mg total) by mouth daily.    . Blood Glucose Monitoring Suppl (CONTOUR NEXT MONITOR) w/Device KIT 1 Device by Does not apply route 3 (three) times daily. Use to check blood sugars 3 times daily. Dx Code E13.9 1 kit 2  . colchicine 0.6 MG tablet Take 0.6 mg by mouth daily as needed.    . donepezil (ARICEPT) 10 MG tablet TAKE  1 TABLET BY MOUTH DAILY 90 tablet 4  . glucose blood (CONTOUR NEXT TEST) test strip USE  STRIP TO CHECK GLUCOSE 4 TIMES DAILY  Dx: E10.9 125 each 3  . Insulin Degludec (TRESIBA FLEXTOUCH) 200 UNIT/ML SOPN Inject 56 Units into the skin daily.    . insulin lispro (HUMALOG) 100 UNIT/ML injection Inject 25-30u in am, 30u at lunch, 30u in evening 60 mL 4  . levothyroxine (SYNTHROID, LEVOTHROID) 75 MCG tablet   1  . lisinopril (PRINIVIL,ZESTRIL) 5 MG tablet TAKE 1 TABLET BY MOUTH DAILY 90 tablet 0  . metoprolol succinate (TOPROL-XL) 25 MG 24 hr tablet Take 1.5 tablets (37.5 mg total) by mouth daily. 135 tablet 3  . NON FORMULARY Inhale 1 application into the lungs at bedtime. CPAP    . potassium chloride SA (K-DUR,KLOR-CON) 20 MEQ tablet Take 20 mEq by mouth 2 (two) times daily.     . rosuvastatin (CRESTOR) 20 MG tablet Take 20 mg by mouth daily.    . Tamsulosin HCl (FLOMAX) 0.4 MG CAPS Take 0.4 mg by mouth daily.     Marland Kitchen torsemide (DEMADEX) 10 MG tablet Take 77m by mouth alternating with 1035m    . Marland KitchenICTOZA 18 MG/3ML SOPN INJECT 0.2MLS INTO THE SKIN DAILY 6 mL 2   No current facility-administered medications for this visit.     LABS/IMAGING: No results found for this or any previous visit (from the past 48 hour(s)). No results found.  VITALS: BP 110/60 (BP Location: Left Arm, Patient Position: Sitting, Cuff Size: Large)   Pulse 61   Ht '5\' 9"'  (1.753 m)   Wt 260 lb 6.4 oz (118.1 kg)   BMI 38.45 kg/m   EXAM: General appearance: alert and no distress Neck: no carotid  bruit and no JVD Lungs: clear to auscultation bilaterally Heart: regular rate and rhythm, S1, S2 normal, systolic murmur: 3/6 early to mid-peaking systolic murmur 3/6, crescendo at 2nd right intercostal space and occasional missed beats Abdomen: obese, soft, non-tender, +BS Extremities: edema trace Pulses: 2+ and symmetric Skin: Skin color, texture, turgor normal. No rashes or lesions Neurologic: Mental status: Alert, oriented, thought content appropriate Psych: Pleasant mood, affect  EKG: Sinus rhythm with first-degree AV block and PVCs at 61, nonspecific ST and T wave changes-personally reviewed  ASSESSMENT: 1. Coronary artery disease status post BMS to the RCA in 2005, recent CABG 2 (LIMA to LAD - for critical left main disease, SVG to OM1)  2. Obesity 3. Hypertension-controlled 4. Dyslipidemia 5. Prior stroke 6. Diabetic neuropathy 7. Diabetic nephropathy - left foot drop 8. Type 1 diabetes on insulin 9. LE edema 10. Mild aortic stenosis  PLAN: 1.   Mr. JoMartiniqueontinues to be asymptomatic from a cardiac standpoint.  His hemoglobin A1c is 7.1, representing a marked improvement in his cholesterol.  He would likely benefit from the addition of Jardiance, given his history of coronary disease and diabetes.  I will defer to Dr. KuDwyane Deen this.  Cholesterol is well controlled with LDL at 40.  Blood pressure is at goal.  He does have mild aortic stenosis which is clinically unchanged.  We will reassess his by echo in 2-3 years.  Follow-up annually or sooner as necessary.  KePixie CasinoMD, FAWhite Fence Surgical SuitesFAFranktownirector of the [facility] Lip[facility]  Card[facility]ovascular [facility] Reduction [facility] Diplomate of the American Board of Clinical Lipidology Attending Cardiologist  Direct Dial: [phone]: [phone]:  [email or web address]  Nadean Corwin Hilty 03/23/2018, 8:36 AM

## 2018-04-10 ENCOUNTER — Other Ambulatory Visit: Payer: Self-pay | Admitting: Endocrinology

## 2018-04-12 DIAGNOSIS — E109 Type 1 diabetes mellitus without complications: Secondary | ICD-10-CM | POA: Diagnosis not present

## 2018-04-18 ENCOUNTER — Other Ambulatory Visit: Payer: Self-pay

## 2018-04-18 MED ORDER — METOPROLOL SUCCINATE ER 25 MG PO TB24: 37.5000 mg | ORAL_TABLET | Freq: Every day | ORAL | 3 refills | Status: DC

## 2018-05-12 DIAGNOSIS — N183 Chronic kidney disease, stage 3 (moderate): Secondary | ICD-10-CM | POA: Diagnosis not present

## 2018-05-12 DIAGNOSIS — I10 Essential (primary) hypertension: Secondary | ICD-10-CM | POA: Diagnosis not present

## 2018-05-12 DIAGNOSIS — I4891 Unspecified atrial fibrillation: Secondary | ICD-10-CM | POA: Diagnosis not present

## 2018-05-12 DIAGNOSIS — E039 Hypothyroidism, unspecified: Secondary | ICD-10-CM | POA: Diagnosis not present

## 2018-05-17 ENCOUNTER — Other Ambulatory Visit (INDEPENDENT_AMBULATORY_CARE_PROVIDER_SITE_OTHER): Payer: Medicare Other

## 2018-05-17 DIAGNOSIS — Z794 Long term (current) use of insulin: Secondary | ICD-10-CM | POA: Diagnosis not present

## 2018-05-17 DIAGNOSIS — E1165 Type 2 diabetes mellitus with hyperglycemia: Secondary | ICD-10-CM | POA: Diagnosis not present

## 2018-05-17 LAB — HEMOGLOBIN A1C: Hgb A1c MFr Bld: 7.3 % — ABNORMAL HIGH (ref 4.6–6.5)

## 2018-05-17 LAB — GLUCOSE, RANDOM: Glucose, Bld: 119 mg/dL — ABNORMAL HIGH (ref 70–99)

## 2018-05-19 ENCOUNTER — Telehealth: Payer: Self-pay | Admitting: Endocrinology

## 2018-05-19 NOTE — Telephone Encounter (Signed)
Called pharmacy and provided clarification and gave pharmacist directions that are listed for Humalog in pt chart.

## 2018-05-19 NOTE — Telephone Encounter (Signed)
[facility] called re: Clarification  on script for Humalog insulin-2 sets of directions. On January Rx said take 4 x daily. The next sentence says take 3 x daily. Please call ph# [phone] to clarify. A later script says 3 x daily, they want to verify that is the correct dosage

## 2018-05-20 ENCOUNTER — Ambulatory Visit: Payer: Medicare Other | Admitting: Endocrinology

## 2018-05-20 ENCOUNTER — Encounter: Payer: Self-pay | Admitting: Endocrinology

## 2018-05-20 VITALS — BP 130/80 | HR 63 | Ht 69.0 in | Wt 259.4 lb

## 2018-05-20 DIAGNOSIS — E1165 Type 2 diabetes mellitus with hyperglycemia: Secondary | ICD-10-CM

## 2018-05-20 DIAGNOSIS — Z794 Long term (current) use of insulin: Secondary | ICD-10-CM | POA: Diagnosis not present

## 2018-05-20 DIAGNOSIS — I10 Essential (primary) hypertension: Secondary | ICD-10-CM | POA: Diagnosis not present

## 2018-05-20 NOTE — Patient Instructions (Addendum)
Tresiba 56   Novolog 15 in am  Lunch 15 if not eating sandwich or 20   Supper 25 at supper  More sugars AFTER supper, less before

## 2018-05-20 NOTE — Progress Notes (Signed)
Patient ID: Randall Wilkins, male   DOB: Jun 17, 1941, 77 y.o.   MRN: [id number]           Reason for Appointment: Follow-up for Type 2 Diabetes   History of Present Illness:          Date of diagnosis of type 2 diabetes mellitus:   1998       Background history:   Has been on insulin since about 2001 He thinks he was given metformin only for a month and may have stopped because of side effects, does not remember specifics. Also Actos was tried and not clear what side effects he had Does not remember other medication he may have tried but has not tried Victoza or Byetta   Recent history:   INSULIN regimen is:   [place] 56 units at around 8 PM .  Humalog 15-20 units 3 times a day ac-pc     Non-insulin hypoglycemic drugs the patient is taking are: Victoza 1.2 mg daily  His last A1c is 7.3, previously 7.1 but has been as high as 8.1  Current management, blood sugar patterns and problems identified:  He has since the last visit also has taken on the 15 to 20 units of Humalog compared to 30 or 35 as recommended with his main meals  He does not understand the need to take Humalog to cover his postprandial hyperglycemia and is taking it based on his blood sugar before eating  When he was on vacation last month he was eating poorly and still not taking enough Humalog causing his overnight blood sugars to be much higher for about 10 days  Also he is monitoring his blood sugars mostly before meals and particularly in the morning but has also some readings at lunch and dinner  Has only 3 or 4 readings after supper which are mostly high  He thinks but does have some cut back on portions  With this he has lost about 4 pounds  For breakfast he continues to eat 2 doughnuts and does not change his eating habits  He does eat relatively lighter meal at lunchtime and either has a half sandwich or cottage cheese and fruit  Overall FASTING blood sugars are high but may be as low as 98  Blood  sugars are averaging fairly good at lunch and dinner  He is compliant with his Victoza in the mornings without any side effects        Side effects from medications have been: Unknown  Compliance with the medical regimen: Fair  Glucose monitoring:  done 3 times a day         Glucometer:  contour      Blood Glucose readings by DOWNLOAD:   Mean values apply above for all meters except median for One Touch  PRE-MEAL Fasting Lunch Dinner Bedtime Overall  Glucose range:  98-255      Mean/median:  175 119  126    152+/-42   POST-MEAL PC Breakfast PC Lunch PC Dinner  Glucose range:    157-236  Mean/median:       Previous readings  Mean values apply above for all meters except median for One Touch  PRE-MEAL Fasting Lunch Dinner Bedtime Overall  Glucose range:  113-205      Mean/median:  168  158    157   POST-MEAL PC Breakfast PC Lunch PC Dinner  Glucose range:   81  76-268  Mean/median:   67-111 164  Self-care:  Meal times are:  Breakfast is at 8 a.m., lunch: 12 noon Dinner: 6 PM   Typical meal intake: Breakfast is usually doughnuts or sometimes egg sandwich   lunch is a sandwich or cottage cheese/food, dinner is meat and vegetables/solid    Snacks: Sugar-free Jell-O or pudding, crackers          Dietician visit, most recent: 12/2017               CDE 8/18  Exercise:  unable to do any  Weight history: Range 252-280 previously  Wt Readings from Last 3 Encounters:  05/20/18 259 lb 6.4 oz (117.7 kg)  03/23/18 260 lb 6.4 oz (118.1 kg)  02/17/18 263 lb (119.3 kg)    Glycemic control:   Lab Results  Component Value Date   HGBA1C 7.3 (H) 05/17/2018   HGBA1C 7.1 (H) 02/15/2018   HGBA1C 7.4 (H) 10/15/2017   Lab Results  Component Value Date   MICROALBUR 20.0 (H) 12/17/2017   LDLCALC 40 02/15/2018   CREATININE 1.31 02/15/2018   Lab Results  Component Value Date   MICRALBCREAT 27.7 12/17/2017    Lab Results  Component Value Date   FRUCTOSAMINE 252  12/17/2017   FRUCTOSAMINE 233 08/09/2017      Allergies as of 05/20/2018      Reactions   Actos [pioglitazone] Swelling   Metformin And Related Nausea Only   Niaspan [niacin Er] Itching, Rash      Medication List        Accurate as of 05/20/18  9:30 AM. Always use your most recent med list.          allopurinol 300 MG tablet Commonly known as:  ZYLOPRIM Take 1 tablet by mouth daily. Take 1 tab by my mouth daily   amitriptyline 50 MG tablet Commonly known as:  ELAVIL Take 50 mg by mouth daily.   aspirin EC 81 MG tablet Take 1 tablet (81 mg total) by mouth daily.   colchicine 0.6 MG tablet Take 0.6 mg by mouth daily as needed.   CONTOUR NEXT MONITOR w/Device Kit 1 Device by Does not apply route 3 (three) times daily. Use to check blood sugars 3 times daily. Dx Code E13.9   donepezil 10 MG tablet Commonly known as:  ARICEPT TAKE 1 TABLET BY MOUTH DAILY   glucose blood test strip Commonly known as:  CONTOUR NEXT TEST USE  STRIP TO CHECK GLUCOSE 4 TIMES DAILY  Dx: E10.9   insulin lispro 100 UNIT/ML injection Commonly known as:  HUMALOG Inject 25-30u in am, 30u at lunch, 30u in evening   levothyroxine 75 MCG tablet Commonly known as:  SYNTHROID, LEVOTHROID   lisinopril 5 MG tablet Commonly known as:  PRINIVIL,ZESTRIL TAKE 1 TABLET BY MOUTH DAILY   metoprolol succinate 25 MG 24 hr tablet Commonly known as:  TOPROL-XL Take 1.5 tablets (37.5 mg total) by mouth daily.   NON FORMULARY Inhale 1 application into the lungs at bedtime. CPAP   potassium chloride SA 20 MEQ tablet Commonly known as:  K-DUR,KLOR-CON Take 20 mEq by mouth 2 (two) times daily.   rosuvastatin 20 MG tablet Commonly known as:  CRESTOR Take 20 mg by mouth daily.   tamsulosin 0.4 MG Caps capsule Commonly known as:  FLOMAX Take 0.4 mg by mouth daily.   torsemide 10 MG tablet Commonly known as:  DEMADEX Take 55m by mouth alternating with 1071m   TRESIBA FLEXTOUCH 200 UNIT/ML  Sopn Generic drug:  Insulin Degludec Inject  56 Units into the skin daily.   VICTOZA 18 MG/3ML Sopn Generic drug:  liraglutide INJECT 0.2 MLS INTO THE SKIN DAILY       Allergies:  Allergies  Allergen Reactions  . Actos [Pioglitazone] Swelling  . Metformin And Related Nausea Only  . Niaspan [Niacin Er] Itching and Rash    Past Medical History:  Diagnosis Date  . Aortic stenosis, mild 11/14/2013  . Aortic valve sclerosis 03/29/2015  . Bilateral leg edema 05/21/2014  . CAD (coronary artery disease)   . Chronic diastolic congestive heart failure ([place])   . Chronic kidney disease (CKD), stage III (moderate) ([facility])   . Diabetes 1.5, managed as type 1 ([place]) 02/04/2013  . Diabetic peripheral neuropathy associated with type 1 diabetes mellitus ([place]) 11/14/2013  . Dysphagia   . Dyspnea on exertion 03/21/2015  . Exogenous obesity   . Gout   . Heart attack ([place])   . History of nuclear stress test 07/2011   dipyridamole; fixed inferolateral defect, worse at stress than rest; no reversible ischemia; low risk scan   . Hyperlipidemia   . Hypertension   . Insulin dependent diabetes mellitus ([place])   . Left foot drop   . Left main coronary artery disease 03/28/2015  . Memory loss   . Obesity (BMI 30-39.9) 11/14/2013  . Obstructive sleep apnea 03/21/2015  . OSA on CPAP    uses a cpap  . Peptic ulcer with hemorrhage 03/28/2015  . Peripheral neuropathy   . Rhabdomyolysis   . 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 Stroke ([place])    L patietal with small scattered lacunar infarcts  . Thrombocytopenia ([place]) 03/21/2015  . Venous insufficiency   . Weakness generalized 03/21/2015    Past Surgical History:  Procedure Laterality Date  . BACK SURGERY  2002   lumbosacral  . Carotid Doppler  03/2013   bilat bulb/prox ICAs - mild amount of fibrous plaque with no evidence of diameter reduction  . CARPAL TUNNEL RELEASE Bilateral 08/09/2014   Procedure: BILATERAL CARPAL  TUNNEL RELEASE;  Surgeon: Daryll Brod, MD;  Location: [facility];  Service: Orthopedics;  Laterality: Bilateral;  ANESTHESIA:  IV REGIONAL BIL FAB  . CHOLECYSTECTOMY    . COLONOSCOPY    . CORONARY ANGIOPLASTY  10/13/1996  . CORONARY ANGIOPLASTY  09/21/1989   emergency PTCA  . CORONARY ANGIOPLASTY  10/13/1996   Multi-Link diagonal & OD stenting (Dr. Marella Chimes)  . CORONARY ANGIOPLASTY  12/03/1997   disease of mid DX-1 ~50% & in mid PLA & PDA (distal lesions) (Dr. Marella Chimes)   . CORONARY ANGIOPLASTY  10/14/1999   progression of disease distal PLA & PDA; progression of disease prox RCA - moderate (Dr. Marella Chimes)   . CORONARY ANGIOPLASTY WITH STENT PLACEMENT  04/04/2004   4.0x37m non-DES (thrombectomy via AngioJet) to RCA for high grade stenosis (Dr. RMarella Chimes  . CORONARY ARTERY BYPASS GRAFT N/A 03/29/2015   Procedure: CORONARY ARTERY BYPASS GRAFTING TIMES TWO USING LEFT INTERNAL MAMMARY ARTERY AND RIGHT LEG GREATER SAPHENOUS VEIN HARVESTED ENDOSCOPICALLY.;  Surgeon: CRexene Alberts MD;  [facility]  Service: Open Heart Surgery;  Laterality: N/A;  . ESOPHAGOGASTRODUODENOSCOPY N/A 03/27/2015   Procedure: ESOPHAGOGASTRODUODENOSCOPY (EGD);  Surgeon: MClarene Essex MD;  [facility];  Service: Endoscopy;  Laterality: N/A;  possible dilation  . LEFT HEART CATHETERIZATION WITH CORONARY ANGIOGRAM N/A 03/28/2015   Procedure: LEFT HEART CATHETERIZATION WITH CORONARY ANGIOGRAM;  Surgeon: Peter M Martinique, MD;  Location[facility];  Service: Cardiovascular;  Laterality: N/A;  . ROTATOR CUFF REPAIR Right   . SINUS ENDO W/FUSION    . TONSILLECTOMY    . TRANSTHORACIC ECHOCARDIOGRAM  08/08/2013   EF 55-60%, mild conc hypertrophy, grade 1 diastolic dysfunction; AV with mild stenosis; LA & RA mildly dilated    Family History  Problem Relation Age of Onset  . Heart disease Mother   . Coronary artery disease Father   . Cancer Maternal Grandmother   . Heart Problems Maternal  Grandfather     Social History:  reports that he quit smoking about 45 years ago. His smoking use included cigarettes. He has a 40.00 pack-year smoking history. He has never used smokeless tobacco. He reports that he does not drink alcohol or use drugs.   Review of Systems   Lipid history: Last LDL  currently on 20 mg rosuvastatin, followed by PCP    Lab Results  Component Value Date   CHOL 96 02/15/2018   HDL 37.60 (L) 02/15/2018   LDLCALC 40 02/15/2018   TRIG 93.0 02/15/2018   CHOLHDL 3 02/15/2018           Hypertension: Mild and controlled with the 5 mg lisinopril and metoprolol   Most recent eye exam was in 2017, Dr. Gershon Crane and reportedly no retinopathy  Most recent foot exam: 9/24  Complications of diabetes: Mark peripheral neuropathy with sensory loss.   LABS:  Lab on 05/17/2018  Component Date Value Ref Range Status  . Glucose, Bld 05/17/2018 119* 70 - 99 mg/dL Final  . Hgb A1c MFr Bld 05/17/2018 7.3* 4.6 - 6.5 % Final   Glycemic Control Guidelines for People with Diabetes:Non Diabetic:  <6%Goal of Therapy: <7%Additional Action Suggested:  >8%     Physical Examination:  BP 130/80 (BP Location: Left Arm, Patient Position: Sitting, Cuff Size: Normal)   Pulse 63   Ht _0  (1.753 m)   Wt 259 lb 6.4 oz (117.7 kg)   SpO2 96%   BMI 38.31 kg/m      ASSESSMENT:  Diabetes type 2, uncontrolled, insulin-dependent with obesity and history of neuropathy  See history of present illness for detailed discussion of current diabetes management, blood sugar patterns and problems identified  His blood sugars are overall improving However A1c is somewhat higher than expected for his home readings He has difficulty with high readings after mostly evening meal and sometimes after breakfast along with overnight hypoglycemia  Recently with trying to improve his diet and working with the dietitian he is having less tendency to hypoglycemia However even with Victoza he is  still requiring significant amount of mealtime insulin coverage Not clear why his fasting readings have started to go up in the last 10 days even though they were near normal late last month  HYPERTENSION: Mild and well controlled  PLAN:   He will need to take Humalog consistently with every meal but more based on what he is eating rather than the pre-meal blood sugar  Discussed that if he is eating a lighter meal especially at lunch when he has cottage cheese he can take only 15 units Continue at least 15 units in the morning if blood sugars are normal He will need to take 25 units consistently at suppertime regardless of pre-meal blood sugar and probably 30 if he is eating a large meal Need to start checking readings after supper and not as many before eating Call if persistently  high readings continue in the morning Discussed blood sugar targets at various times Counseling time on subjects discussed in assessment and plan sections is over 50% of today's 25 minute visit  No change in the [place] as yet Need to have balanced and lower glycemic index food at breakfast instead of eating donuts every day  Patient Instructions  [place] 56   Novolog 15 in am  Lunch 15 if not eating sandwich or 20   Supper 25 at supper  More sugars AFTER supper, less before    Counseling time on subjects discussed in assessment and plan sections is over 50% of today's 25 minute visit    Elayne Snare 05/20/2018, 9:30 AM   Note: This office note was prepared with Dragon voice recognition system technology. Any transcriptional errors that result from this process are unintentional.

## 2018-06-08 DIAGNOSIS — E109 Type 1 diabetes mellitus without complications: Secondary | ICD-10-CM | POA: Diagnosis not present

## 2018-06-30 ENCOUNTER — Other Ambulatory Visit: Payer: Self-pay | Admitting: Family Medicine

## 2018-06-30 ENCOUNTER — Ambulatory Visit
Admission: RE | Admit: 2018-06-30 | Discharge: 2018-06-30 | Disposition: A | Discharge status: Home / Self Care | Payer: Medicare Other | Source: Ambulatory Visit | Attending: Family Medicine | Admitting: Family Medicine

## 2018-06-30 DIAGNOSIS — R05 Cough: Secondary | ICD-10-CM | POA: Diagnosis not present

## 2018-06-30 DIAGNOSIS — J3489 Other specified disorders of nose and nasal sinuses: Secondary | ICD-10-CM | POA: Diagnosis not present

## 2018-06-30 DIAGNOSIS — R0989 Other specified symptoms and signs involving the circulatory and respiratory systems: Secondary | ICD-10-CM

## 2018-06-30 DIAGNOSIS — M21372 Foot drop, left foot: Secondary | ICD-10-CM | POA: Diagnosis not present

## 2018-07-05 DIAGNOSIS — M21372 Foot drop, left foot: Secondary | ICD-10-CM | POA: Diagnosis not present

## 2018-07-11 ENCOUNTER — Telehealth (INDEPENDENT_AMBULATORY_CARE_PROVIDER_SITE_OTHER): Payer: Self-pay | Admitting: Orthopedic Surgery

## 2018-07-11 ENCOUNTER — Telehealth: Payer: Self-pay | Admitting: Neurology

## 2018-07-11 MED ORDER — DONEPEZIL HCL 10 MG PO TABS: ORAL_TABLET | ORAL | 3 refills | Status: DC

## 2018-07-11 NOTE — Telephone Encounter (Signed)
Patient called needing to get the gel injection again in his right knee. Patient advised the injection need approval by his insurance company. Randall Wilkins) Patient said he can hardly walk or put weight on his knee. The number to contact patient is [phone]

## 2018-07-11 NOTE — Telephone Encounter (Signed)
Can you please see about getting patient approved for gel injection in his knee. I offered him an appt to see Dr Marlou Sa for asp/injection with cortisone but he refused stating he wanted to wait until gel approved.

## 2018-07-11 NOTE — Telephone Encounter (Signed)
sure

## 2018-07-11 NOTE — Telephone Encounter (Signed)
Are you ok with me bringing him in to asp/inject cortisone until we can get him approved for gel injection?

## 2018-07-11 NOTE — Telephone Encounter (Signed)
Pts wife Mickel Baas called stating that [facility] is unable to get medication donepezil (ARICEPT) 10 MG tablet with no access to a release date of medication. Mickel Baas requesting a call to discuss sending in an alternate medication.

## 2018-07-11 NOTE — Telephone Encounter (Signed)
Spoke to [facility] - donepezil is temporarily backordered.    Spoke to patient's wife and she would like the refills sent to the local pharmacy.

## 2018-07-12 DIAGNOSIS — R0789 Other chest pain: Secondary | ICD-10-CM | POA: Diagnosis not present

## 2018-07-12 NOTE — Telephone Encounter (Signed)
Noted  

## 2018-07-15 ENCOUNTER — Telehealth (INDEPENDENT_AMBULATORY_CARE_PROVIDER_SITE_OTHER): Payer: Self-pay

## 2018-07-15 ENCOUNTER — Telehealth (INDEPENDENT_AMBULATORY_CARE_PROVIDER_SITE_OTHER): Payer: Self-pay | Admitting: Orthopedic Surgery

## 2018-07-15 NOTE — Telephone Encounter (Signed)
Synvisc One on 09/16/16  Can you please advise on what type injection you are getting authorized? It looks as if patient is in discussion with his insurance company.

## 2018-07-15 NOTE — Telephone Encounter (Signed)
Submitted application online for SynviscOne, right knee.

## 2018-07-15 NOTE — Telephone Encounter (Signed)
Patient called advised the insurance company need to know the name of the gel injection Dr Marlou Sa will use and when was the last time patient had the gel injection. The number to contact patient is [phone]

## 2018-07-15 NOTE — Telephone Encounter (Signed)
Talked with patient and advised him that he doesn't have to do anything with his insurance to submit for SynviscOne, right knee injection.  Advised patient that I will submit application through MySynviscOne portal.

## 2018-07-23 ENCOUNTER — Other Ambulatory Visit: Payer: Self-pay | Admitting: Endocrinology

## 2018-07-26 ENCOUNTER — Other Ambulatory Visit: Payer: Self-pay | Admitting: Endocrinology

## 2018-07-27 DIAGNOSIS — E669 Obesity, unspecified: Secondary | ICD-10-CM | POA: Diagnosis not present

## 2018-07-27 DIAGNOSIS — M150 Primary generalized (osteo)arthritis: Secondary | ICD-10-CM | POA: Diagnosis not present

## 2018-07-27 DIAGNOSIS — M5136 Other intervertebral disc degeneration, lumbar region: Secondary | ICD-10-CM | POA: Diagnosis not present

## 2018-07-27 DIAGNOSIS — M1A09X0 Idiopathic chronic gout, multiple sites, without tophus (tophi): Secondary | ICD-10-CM | POA: Diagnosis not present

## 2018-08-02 ENCOUNTER — Telehealth (INDEPENDENT_AMBULATORY_CARE_PROVIDER_SITE_OTHER): Payer: Self-pay

## 2018-08-02 NOTE — Telephone Encounter (Signed)
Patient is approved for SynviscOne, Right Knee. [place] Patient will be responsible for 20% OOP No Co-pay No PA required. Appt.scheduled 08/18/2018

## 2018-08-18 ENCOUNTER — Ambulatory Visit (INDEPENDENT_AMBULATORY_CARE_PROVIDER_SITE_OTHER): Payer: Medicare Other | Admitting: Orthopedic Surgery

## 2018-08-18 ENCOUNTER — Encounter (INDEPENDENT_AMBULATORY_CARE_PROVIDER_SITE_OTHER): Payer: Self-pay | Admitting: Orthopedic Surgery

## 2018-08-18 DIAGNOSIS — M1711 Unilateral primary osteoarthritis, right knee: Secondary | ICD-10-CM | POA: Diagnosis not present

## 2018-08-18 MED ORDER — HYLAN G-F 20 48 MG/6ML IX SOSY
48.0000 mg | PREFILLED_SYRINGE | INTRA_ARTICULAR | Status: AC | PRN
  Administered 2018-08-18: 48 mg via INTRA_ARTICULAR

## 2018-08-18 MED ORDER — LIDOCAINE HCL 1 % IJ SOLN
5.0000 mL | INTRAMUSCULAR | Status: AC | PRN
  Administered 2018-08-18: 5 mL

## 2018-08-18 NOTE — Progress Notes (Signed)
Office Visit Note   Patient: Randall Wilkins           Date of Birth: Jan 02, 1941           MRN: [id number] Visit Date: 08/18/2018 Requested by: Randall Arabian, MD [address] [place], [place] [address] PCP: Randall Arabian, MD  Subjective: Chief Complaint  Patient presents with  . Right Knee - Pain, Follow-up    HPI: Randall Wilkins is a patient with right knee pain.  Has known history of right knee arthritis.  Requesting gel injection today.  Last injection 3 years ago and he did well with that.  Has not had any interval injury.  He is able to get around.  Uses a brace on his left ankle.              ROS: All systems reviewed are negative as they relate to the chief complaint within the history of present illness.  Patient denies  fevers or chills.   Assessment & Plan: Visit Diagnoses:  1. Unilateral primary osteoarthritis, right knee     Plan: Impression is right knee pain with arthritis.  Plan is gel injection.  Hopefully this will last him several more years.  I will see him back as needed.  Follow-Up Instructions: Return if symptoms worsen or fail to improve.   Orders:  No orders of the defined types were placed in this encounter.  No orders of the defined types were placed in this encounter.     Procedures: Large Joint Inj: R knee on 08/18/2018 9:01 AM Indications: pain, joint swelling and diagnostic evaluation Details: 18 G 1.5 in needle, superolateral approach  Arthrogram: No  Medications: 5 mL lidocaine 1 %; 48 mg Hylan 48 MG/6ML Outcome: tolerated well, no immediate complications Procedure, treatment alternatives, risks and benefits explained, specific risks discussed. Consent was given by the patient. Immediately prior to procedure a time out was called to verify the correct patient, procedure, equipment, support staff and site/side marked as required. Patient was prepped and draped in the usual sterile fashion.       Clinical Data: No additional  findings.  Objective: Vital Signs: There were no vitals taken for this visit.  Physical Exam:   Constitutional: Patient appears well-developed HEENT:  Head: Normocephalic Eyes:EOM are normal Neck: Normal range of motion Cardiovascular: Normal rate Pulmonary/chest: Effort normal Neurologic: Patient is alert Skin: Skin is warm Psychiatric: Patient has normal mood and affect    Ortho Exam: Ortho exam demonstrates 10 degree flexion contracture on the right with flexion to about 90 degrees.  Extensor mechanism is intact.  No groin pain with internal and external rotation of the leg.  Mild edema present in both lower extremities.  Ankle dorsiflexion plantarflexion intact on the right.  Collaterals are stable.  Specialty Comments:  No specialty comments available.  Imaging: No results found.   PMFS History: Patient Active Problem List   Diagnosis Date Noted  . Essential hypertension 03/23/2018  . Mild cognitive impairment 12/24/2017  . Gait abnormality 12/24/2017  . Murmur, cardiac 03/04/2017  . History of stroke 12/12/2015  . Gait disorder 12/12/2015  . Coronary artery disease involving native coronary artery of native heart without angina pectoris   . S/P CABG x 2 03/29/2015  . Aortic valve sclerosis 03/29/2015  . Peptic ulcer with hemorrhage 03/28/2015  . Left main coronary artery disease 03/28/2015  . Chronic diastolic congestive heart failure ([place])   . Chronic kidney disease (CKD), stage III (moderate) (HCC)   .  Dysphagia   . AKI (acute kidney injury) ([place])   . Acute respiratory failure with hypoxia ([place]) 03/25/2015  . NSTEMI (non-ST elevated myocardial infarction) ([place]) 03/25/2015  . Shortness of breath   . UTI (lower urinary tract infection)   . Subendocardial MI subsequent episode care ([place]) 03/24/2015  . SOB (shortness of breath)   . Confusion 03/21/2015  . Weakness generalized 03/21/2015  . Increased urinary frequency 03/21/2015  . Hyperglycemia 03/21/2015    . Thrombocytopenia ([place]) 03/21/2015  . UTI (urinary tract infection) 03/21/2015  . Fall 03/21/2015  . Hematoma of abdominal wall 03/21/2015  . Diarrhea 03/21/2015  . Obstructive sleep apnea 03/21/2015  . Acute renal failure ([place]) 03/21/2015  . Mild diastolic dysfunction 79/89/2119  . Dyspnea on exertion 03/21/2015  . Neuropathy 09/11/2014  . Bilateral leg edema 05/21/2014  . Diabetic peripheral neuropathy associated with type 1 diabetes mellitus ([place]) 11/14/2013  . Type 1 diabetes mellitus with nephropathy ([place]) 11/14/2013  . Aortic valve stenosis 11/14/2013  . Obesity (BMI 30-39.9) 11/14/2013  . Asymptomatic PVCs 11/14/2013  . Memory impairment 02/04/2013  . Hyperlipidemia 02/04/2013  . Diabetes 1.5, managed as type 1 ([place]) 02/04/2013  . Cerebral infarction ([place]) 02/04/2013  . CAD S/P percutaneous coronary angioplasty 11/14/2004   Past Medical History:  Diagnosis Date  . Aortic stenosis, mild 11/14/2013  . Aortic valve sclerosis 03/29/2015  . Bilateral leg edema 05/21/2014  . CAD (coronary artery disease)   . Chronic diastolic congestive heart failure ([place])   . Chronic kidney disease (CKD), stage III (moderate) (HCC)   . Diabetes 1.5, managed as type 1 ([place]) 02/04/2013  . Diabetic peripheral neuropathy associated with type 1 diabetes mellitus ([place]) 11/14/2013  . Dysphagia   . Dyspnea on exertion 03/21/2015  . Exogenous obesity   . Gout   . Heart attack ([place])   . History of nuclear stress test 07/2011   dipyridamole; fixed inferolateral defect, worse at stress than rest; no reversible ischemia; low risk scan   . Hyperlipidemia   . Hypertension   . Insulin dependent diabetes mellitus ([place])   . Left foot drop   . Left main coronary artery disease 03/28/2015  . Memory loss   . Obesity (BMI 30-39.9) 11/14/2013  . Obstructive sleep apnea 03/21/2015  . OSA on CPAP    uses a cpap  . Peptic ulcer with hemorrhage 03/28/2015  . Peripheral neuropathy   . Rhabdomyolysis   . S/P CABG x 2  03/29/2015   LIMA to Diagonal, SVG to OM, EVH via right thigh  . Stroke ([place])    L patietal with small scattered lacunar infarcts  . Thrombocytopenia ([place]) 03/21/2015  . Venous insufficiency   . Weakness generalized 03/21/2015    Family History  Problem Relation Age of Onset  . Heart disease Mother   . Coronary artery disease Father   . Cancer Maternal Grandmother   . Heart Problems Maternal Grandfather     Past Surgical History:  Procedure Laterality Date  . BACK SURGERY  2002   lumbosacral  . Carotid Doppler  03/2013   bilat bulb/prox ICAs - mild amount of fibrous plaque with no evidence of diameter reduction  . CARPAL TUNNEL RELEASE Bilateral 08/09/2014   Procedure: BILATERAL CARPAL TUNNEL RELEASE;  Surgeon: Daryll Brod, MD;  Location: [facility];  Service: Orthopedics;  Laterality: Bilateral;  ANESTHESIA:  IV REGIONAL BIL FAB  . CHOLECYSTECTOMY    . COLONOSCOPY    . CORONARY ANGIOPLASTY  10/13/1996  . CORONARY  ANGIOPLASTY  09/21/1989   emergency PTCA  . CORONARY ANGIOPLASTY  10/13/1996   Multi-Link diagonal & OD stenting (Dr. Marella Chimes)  . CORONARY ANGIOPLASTY  12/03/1997   disease of mid DX-1 ~50% & in mid PLA & PDA (distal lesions) (Dr. Marella Chimes)   . CORONARY ANGIOPLASTY  10/14/1999   progression of disease distal PLA & PDA; progression of disease prox RCA - moderate (Dr. Marella Chimes)   . CORONARY ANGIOPLASTY WITH STENT PLACEMENT  04/04/2004   4.0x50mm non-DES (thrombectomy via AngioJet) to RCA for high grade stenosis (Dr. Marella Chimes)  . CORONARY ARTERY BYPASS GRAFT N/A 03/29/2015   Procedure: CORONARY ARTERY BYPASS GRAFTING TIMES TWO USING LEFT INTERNAL MAMMARY ARTERY AND RIGHT LEG GREATER SAPHENOUS VEIN HARVESTED ENDOSCOPICALLY.;  Surgeon: Rexene Alberts, MD;  Location: [facility];  Service: Open Heart Surgery;  Laterality: N/A;  . ESOPHAGOGASTRODUODENOSCOPY N/A 03/27/2015   Procedure: ESOPHAGOGASTRODUODENOSCOPY (EGD);  Surgeon: Clarene Essex, MD;  Location: [facility]  ENDOSCOPY;  Service: Endoscopy;  Laterality: N/A;  possible dilation  . LEFT HEART CATHETERIZATION WITH CORONARY ANGIOGRAM N/A 03/28/2015   Procedure: LEFT HEART CATHETERIZATION WITH CORONARY ANGIOGRAM;  Surgeon: Peter M Martinique, MD;  [facility];  Service: Cardiovascular;  Laterality: N/A;  . ROTATOR CUFF REPAIR Right   . SINUS ENDO W/FUSION    . TONSILLECTOMY    . TRANSTHORACIC ECHOCARDIOGRAM  08/08/2013   EF 55-60%, mild conc hypertrophy, grade 1 diastolic dysfunction; AV with mild stenosis; LA & RA mildly dilated   Social History   Occupational History  . Not on file  Tobacco Use  . Smoking status: Former Smoker    Packs/day: 2.00    Years: 20.00    Pack years: 40.00    Types: Cigarettes    Last attempt to quit: 01/25/1973    Years since quitting: 45.5  . Smokeless tobacco: Never Used  Substance and Sexual Activity  . Alcohol use: No    Alcohol/week: 0.0 standard drinks  . Drug use: No  . Sexual activity: Not on file

## 2018-08-22 ENCOUNTER — Other Ambulatory Visit (INDEPENDENT_AMBULATORY_CARE_PROVIDER_SITE_OTHER): Payer: Medicare Other

## 2018-08-22 DIAGNOSIS — Z794 Long term (current) use of insulin: Secondary | ICD-10-CM | POA: Diagnosis not present

## 2018-08-22 DIAGNOSIS — E1165 Type 2 diabetes mellitus with hyperglycemia: Secondary | ICD-10-CM

## 2018-08-22 LAB — HEMOGLOBIN A1C: Hgb A1c MFr Bld: 7.1 % — ABNORMAL HIGH (ref 4.6–6.5)

## 2018-08-22 LAB — BASIC METABOLIC PANEL
BUN: 15 mg/dL (ref 6–23)
CO2: 32 mEq/L (ref 19–32)
Calcium: 9.1 mg/dL (ref 8.4–10.5)
Chloride: 103 mEq/L (ref 96–112)
Creatinine, Ser: 1.27 mg/dL (ref 0.40–1.50)
GFR: 58.35 mL/min — ABNORMAL LOW (ref >60.00)
Glucose, Bld: 97 mg/dL (ref 70–99)
Potassium: 3.9 mEq/L (ref 3.5–5.1)
Sodium: 142 mEq/L (ref 135–145)

## 2018-08-25 ENCOUNTER — Ambulatory Visit: Payer: Medicare Other | Admitting: Endocrinology

## 2018-08-25 ENCOUNTER — Encounter: Payer: Self-pay | Admitting: Endocrinology

## 2018-08-25 VITALS — BP 148/86 | HR 76 | Ht 69.0 in | Wt 258.0 lb

## 2018-08-25 DIAGNOSIS — Z794 Long term (current) use of insulin: Secondary | ICD-10-CM

## 2018-08-25 DIAGNOSIS — E1165 Type 2 diabetes mellitus with hyperglycemia: Secondary | ICD-10-CM

## 2018-08-25 NOTE — Patient Instructions (Addendum)
Take Humalog just before the meal   10-15 at lunch; 20 for sandwich  25 at supper

## 2018-08-25 NOTE — Progress Notes (Signed)
Patient ID: Randall Wilkins, male   DOB: 12/13/1941, 77 y.o.   MRN: [id number]           Reason for Appointment: Follow-up for Type 2 Diabetes   History of Present Illness:          Date of diagnosis of type 2 diabetes mellitus:   1998       Background history:   Has been on insulin since about 2001 He thinks he was given metformin only for a month and may have stopped because of side effects, does not remember specifics. Also Actos was tried and not clear what side effects he had Does not remember other medication he may have tried but has not tried Victoza or Byetta   Recent history:   INSULIN regimen is:   [place] 56 units at around 8 PM .  Humalog 15-20 units after breakfast and lunch and 20-25 at dinner     Non-insulin hypoglycemic drugs the patient is taking are: Victoza 1.2 mg daily  His A1c is slightly better at 7.1 but has been as high as 8.1  Current management, blood sugar patterns and problems identified:   Blood sugar readings were reviewed from his monitor download  He is checking primarily fasting readings which are generally done around 9 AM  He cannot explain why his blood sugars are extremely variable in the morning although his wife thinks that some of the high readings were from eating a few grapes at bedtime  HIGHEST blood sugars are still after supper and fairly consistently but only has about 5 readings in the last month  He is again sometimes taking only 20 units at suppertime on the Humalog if his blood sugar is near normal but does not consider how much he is eating; even with only about 1 or 2 small carbohydrate servings at suppertime he has higher readings postprandially  Also appears to be eating a variable amount at lunchtime as he is not always hungry which may sometimes result in blood sugars being low normal before dinnertime  No hypoglycemia  Blood sugar before lunch, usually fairly good even though he is not always eating a balanced  breakfast and still eating things like donuts  Not able to do any exercise because of joint pains  Weight is about the same  His wife is giving him his Humalog but doing this after eating  Patient takes [place] by himself but rarely may forget to take this  Although he had a steroid injection in his knee last week does not appear to have had higher sugars with this        Side effects from medications have been: Unknown  Compliance with the medical regimen: Fair  Glucose monitoring:  done 3 times a day         Glucometer:  contour      Blood Glucose readings by DOWNLOAD:    PRE-MEAL Fasting Lunch Dinner Bedtime Overall  Glucose range:  90-222   77-119    Mean/median:  155       POST-MEAL PC Breakfast PC Lunch PC Dinner  Glucose range:    169-268  Mean/median:       Previous readings  Mean values apply above for all meters except median for One Touch  PRE-MEAL Fasting Lunch Dinner Bedtime Overall  Glucose range:  98-255      Mean/median:  175 119  126    152+/-42   POST-MEAL PC Breakfast PC Lunch PC Dinner  Glucose  range:    157-236  Mean/median:         Self-care:  Meal times are:  Breakfast is at 8 a.m., lunch: 12 noon Dinner: 6 PM   Typical meal intake: Breakfast is usually doughnuts or sometimes egg sandwich   lunch is a sandwich or cottage cheese/food, dinner is meat and vegetables/solid    Snacks: Sugar-free Jell-O or pudding, crackers          Dietician visit, most recent: 12/2017               CDE 8/18  Exercise:  unable to do any  Weight history: Range 252-280 previously  Wt Readings from Last 3 Encounters:  08/25/18 258 lb (117 kg)  05/20/18 259 lb 6.4 oz (117.7 kg)  03/23/18 260 lb 6.4 oz (118.1 kg)    Glycemic control:   Lab Results  Component Value Date   HGBA1C 7.1 (H) 08/22/2018   HGBA1C 7.3 (H) 05/17/2018   HGBA1C 7.1 (H) 02/15/2018   Lab Results  Component Value Date   MICROALBUR 20.0 (H) 12/17/2017   LDLCALC 40 02/15/2018    CREATININE 1.27 08/22/2018   Lab Results  Component Value Date   MICRALBCREAT 27.7 12/17/2017    Lab Results  Component Value Date   FRUCTOSAMINE 252 12/17/2017   FRUCTOSAMINE 233 08/09/2017      Allergies as of 08/25/2018      Reactions   Actos [pioglitazone] Swelling   Metformin And Related Nausea Only   Niaspan [niacin Er] Itching, Rash      Medication List        Accurate as of 08/25/18  8:51 AM. Always use your most recent med list.          allopurinol 300 MG tablet Commonly known as:  ZYLOPRIM Take 1 tablet by mouth daily. Take 1 tab by my mouth daily   amitriptyline 50 MG tablet Commonly known as:  ELAVIL Take 50 mg by mouth daily.   aspirin EC 81 MG tablet Take 1 tablet (81 mg total) by mouth daily.   colchicine 0.6 MG tablet Take 0.6 mg by mouth daily as needed.   CONTOUR NEXT MONITOR w/Device Kit 1 Device by Does not apply route 3 (three) times daily. Use to check blood sugars 3 times daily. Dx Code E13.9   donepezil 10 MG tablet Commonly known as:  ARICEPT TAKE 1 TABLET BY MOUTH DAILY   glucose blood test strip USE  STRIP TO CHECK GLUCOSE 4 TIMES DAILY  Dx: E10.9   Insulin Degludec 200 UNIT/ML Sopn Inject 56 Units into the skin daily. Reports taking 52 units each night.   insulin lispro 100 UNIT/ML injection Commonly known as:  HUMALOG Inject 25-30u in am, 30u at lunch, 30u in evening   levothyroxine 75 MCG tablet Commonly known as:  SYNTHROID, LEVOTHROID   lisinopril 5 MG tablet Commonly known as:  PRINIVIL,ZESTRIL TAKE 1 TABLET BY MOUTH DAILY   metoprolol succinate 25 MG 24 hr tablet Commonly known as:  TOPROL-XL Take 1.5 tablets (37.5 mg total) by mouth daily.   NON FORMULARY Inhale 1 application into the lungs at bedtime. CPAP   potassium chloride SA 20 MEQ tablet Commonly known as:  K-DUR,KLOR-CON Take 20 mEq by mouth 2 (two) times daily.   rosuvastatin 20 MG tablet Commonly known as:  CRESTOR Take 20 mg by mouth  daily.   tamsulosin 0.4 MG Caps capsule Commonly known as:  FLOMAX Take 0.4 mg by mouth daily.   torsemide  10 MG tablet Commonly known as:  DEMADEX Take '50mg'$  by mouth alternating with '100mg'$ .   VICTOZA 18 MG/3ML Sopn Generic drug:  liraglutide INJECT 0.2 ML INTO THE SKIN DAILY       Allergies:  Allergies  Allergen Reactions  . Actos [Pioglitazone] Swelling  . Metformin And Related Nausea Only  . Niaspan [Niacin Er] Itching and Rash    Past Medical History:  Diagnosis Date  . Aortic stenosis, mild 11/14/2013  . Aortic valve sclerosis 03/29/2015  . Bilateral leg edema 05/21/2014  . CAD (coronary artery disease)   . Chronic diastolic congestive heart failure ([place])   . Chronic kidney disease (CKD), stage III (moderate) (HCC)   . Diabetes 1.5, managed as type 1 ([place]) 02/04/2013  . Diabetic peripheral neuropathy associated with type 1 diabetes mellitus ([place]) 11/14/2013  . Dysphagia   . Dyspnea on exertion 03/21/2015  . Exogenous obesity   . Gout   . Heart attack ([place])   . History of nuclear stress test 07/2011   dipyridamole; fixed inferolateral defect, worse at stress than rest; no reversible ischemia; low risk scan   . Hyperlipidemia   . Hypertension   . Insulin dependent diabetes mellitus ([place])   . Left foot drop   . Left main coronary artery disease 03/28/2015  . Memory loss   . Obesity (BMI 30-39.9) 11/14/2013  . Obstructive sleep apnea 03/21/2015  . OSA on CPAP    uses a cpap  . Peptic ulcer with hemorrhage 03/28/2015  . Peripheral neuropathy   . Rhabdomyolysis   . 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 Stroke ([place])    L patietal with small scattered lacunar infarcts  . Thrombocytopenia ([place]) 03/21/2015  . Venous insufficiency   . Weakness generalized 03/21/2015    Past Surgical History:  Procedure Laterality Date  . BACK SURGERY  2002   lumbosacral  . Carotid Doppler  03/2013   bilat bulb/prox ICAs - mild amount of fibrous plaque  with no evidence of diameter reduction  . CARPAL TUNNEL RELEASE Bilateral 08/09/2014   Procedure: BILATERAL CARPAL TUNNEL RELEASE;  Surgeon: Daryll Brod, MD;  Location: [facility];  Service: Orthopedics;  Laterality: Bilateral;  ANESTHESIA:  IV REGIONAL BIL FAB  . CHOLECYSTECTOMY    . COLONOSCOPY    . CORONARY ANGIOPLASTY  10/13/1996  . CORONARY ANGIOPLASTY  09/21/1989   emergency PTCA  . CORONARY ANGIOPLASTY  10/13/1996   Multi-Link diagonal & OD stenting (Dr. Marella Chimes)  . CORONARY ANGIOPLASTY  12/03/1997   disease of mid DX-1 ~50% & in mid PLA & PDA (distal lesions) (Dr. Marella Chimes)   . CORONARY ANGIOPLASTY  10/14/1999   progression of disease distal PLA & PDA; progression of disease prox RCA - moderate (Dr. Marella Chimes)   . CORONARY ANGIOPLASTY WITH STENT PLACEMENT  04/04/2004   4.0x59m non-DES (thrombectomy via AngioJet) to RCA for high grade stenosis (Dr. RMarella Chimes  . CORONARY ARTERY BYPASS GRAFT N/A 03/29/2015   Procedure: CORONARY ARTERY BYPASS GRAFTING TIMES TWO USING LEFT INTERNAL MAMMARY ARTERY AND RIGHT LEG GREATER SAPHENOUS VEIN HARVESTED ENDOSCOPICALLY.;  Surgeon: CRexene Alberts MD;  [place] [facility]  Service: Open Heart Surgery;  Laterality: N/A;  . ESOPHAGOGASTRODUODENOSCOPY N/A 03/27/2015   Procedure: ESOPHAGOGASTRODUODENOSCOPY (EGD);  Surgeon: MClarene Essex MD;  [facility]  Service: Endoscopy;  Laterality: N/A;  possible dilation  . LEFT HEART CATHETERIZATION WITH CORONARY ANGIOGRAM N/A 03/28/2015  Procedure: LEFT HEART CATHETERIZATION WITH CORONARY ANGIOGRAM;  Surgeon: Peter M Martinique, MD;  Location: [facility] CATH LAB;  Service: Cardiovascular;  Laterality: N/A;  . ROTATOR CUFF REPAIR Right   . SINUS ENDO W/FUSION    . TONSILLECTOMY    . TRANSTHORACIC ECHOCARDIOGRAM  08/08/2013   EF 55-60%, mild conc hypertrophy, grade 1 diastolic dysfunction; AV with mild stenosis; LA & RA mildly dilated    Family History  Problem Relation Age of Onset  .  Heart disease Mother   . Coronary artery disease Father   . Cancer Maternal Grandmother   . Heart Problems Maternal Grandfather     Social History:  reports that he quit smoking about 45 years ago. His smoking use included cigarettes. He has a 40.00 pack-year smoking history. He has never used smokeless tobacco. He reports that he does not drink alcohol or use drugs.   Review of Systems   Lipid history: Last LDL  currently on 20 mg rosuvastatin, followed by PCP    Lab Results  Component Value Date   CHOL 96 02/15/2018   HDL 37.60 (L) 02/15/2018   LDLCALC 40 02/15/2018   TRIG 93.0 02/15/2018   CHOLHDL 3 02/15/2018           Hypertension: Mild and controlled with the 5 mg lisinopril and metoprolol   Most recent eye exam was in 2017, Dr. Gershon Crane and reportedly no retinopathy  Most recent foot exam: 8/34  Complications of diabetes: Mark peripheral neuropathy with sensory loss.   LABS:  Lab on 08/22/2018  Component Date Value Ref Range Status  . Sodium 08/22/2018 142  135 - 145 mEq/L Final  . Potassium 08/22/2018 3.9  3.5 - 5.1 mEq/L Final  . Chloride 08/22/2018 103  96 - 112 mEq/L Final  . CO2 08/22/2018 32  19 - 32 mEq/L Final  . Glucose, Bld 08/22/2018 97  70 - 99 mg/dL Final  . BUN 08/22/2018 15  6 - 23 mg/dL Final  . Creatinine, Ser 08/22/2018 1.27  0.40 - 1.50 mg/dL Final  . Calcium 08/22/2018 9.1  8.4 - 10.5 mg/dL Final  . GFR 08/22/2018 58.35* >60.00 mL/min Final  . Hgb A1c MFr Bld 08/22/2018 7.1* 4.6 - 6.5 % Final   Glycemic Control Guidelines for People with Diabetes:Non Diabetic:  <6%Goal of Therapy: <7%Additional Action Suggested:  >8%     Physical Examination:  BP (!) 148/86   Pulse 76   Ht '5\' 9"'$  (1.753 m)   Wt 258 lb (117 kg)   SpO2 95%   BMI 38.10 kg/m      ASSESSMENT:  Diabetes type 2, uncontrolled, insulin-dependent with obesity and history of neuropathy  See history of present illness for detailed discussion of current diabetes  management, blood sugar patterns and problems identified  His blood sugars are overall fairly good but fluctuating and not always related to dietary changes However with not taking insulin before eating and not adjusting based on what he is eating his blood sugars are not consistent Mostly to high readings are after supper which he does not monitor enough   HYPERTENSION: Blood pressure is relatively higher and he will follow-up with the treating physicians    PLAN:   He will need to take Humalog consistently BEFORE every meal instead of after He will take 25 units at suppertime as discussed before even if blood sugar is normal before eating However with his lunch meal he can take 10 units for very small carbohydrate meals and continue 20  units for sandwiches He does need to start checking blood sugars more often after meals and less in the morning Avoid high carbohydrate foods especially at breakfast Discussed day-to-day management of diet, insulin adjustment based on carbohydrates and blood sugar targets Continue Victoza 1.2 mg  Counseling time on subjects discussed in assessment and plan sections is over 50% of today's 25 minute visit   Patient Instructions  Take Humalog just before the meal   10-15 at lunch; 20 for sandwich  25 at supper           Elayne Snare 08/25/2018, 8:51 AM   Note: This office note was prepared with Dragon voice recognition system technology. Any transcriptional errors that result from this process are unintentional.

## 2018-08-28 ENCOUNTER — Other Ambulatory Visit: Payer: Self-pay | Admitting: Internal Medicine

## 2018-08-31 DIAGNOSIS — E669 Obesity, unspecified: Secondary | ICD-10-CM | POA: Diagnosis not present

## 2018-08-31 DIAGNOSIS — I509 Heart failure, unspecified: Secondary | ICD-10-CM | POA: Diagnosis not present

## 2018-08-31 DIAGNOSIS — E8770 Fluid overload, unspecified: Secondary | ICD-10-CM | POA: Diagnosis not present

## 2018-08-31 DIAGNOSIS — N183 Chronic kidney disease, stage 3 (moderate): Secondary | ICD-10-CM | POA: Diagnosis not present

## 2018-08-31 DIAGNOSIS — Z23 Encounter for immunization: Secondary | ICD-10-CM | POA: Diagnosis not present

## 2018-10-14 ENCOUNTER — Telehealth: Payer: Self-pay | Admitting: Internal Medicine

## 2018-10-14 NOTE — Telephone Encounter (Signed)
Patient will need appointment to see Dr Claiborne Billings. Message will be routed to schedulers to give patient first available sleep clinic appointment.

## 2018-10-14 NOTE — Telephone Encounter (Signed)
Called patients wife, she states that her husband is unable to get his supplies from the old doctor that use to see him for his sleep apnea as they do not go to him anymore.  She would like for her husband to come see Dr.Kelly as a sleep patient if possible.

## 2018-10-14 NOTE — Telephone Encounter (Signed)
New message   Patient's wife would like a referral for patient's sleep apnea. Please advise.

## 2018-10-18 DIAGNOSIS — E109 Type 1 diabetes mellitus without complications: Secondary | ICD-10-CM | POA: Diagnosis not present

## 2018-10-20 DIAGNOSIS — G629 Polyneuropathy, unspecified: Secondary | ICD-10-CM | POA: Diagnosis not present

## 2018-10-20 DIAGNOSIS — M21372 Foot drop, left foot: Secondary | ICD-10-CM | POA: Diagnosis not present

## 2018-10-20 DIAGNOSIS — R5383 Other fatigue: Secondary | ICD-10-CM | POA: Diagnosis not present

## 2018-10-31 ENCOUNTER — Telehealth: Payer: Self-pay | Admitting: Endocrinology

## 2018-10-31 ENCOUNTER — Other Ambulatory Visit: Payer: Self-pay

## 2018-10-31 MED ORDER — LIRAGLUTIDE 18 MG/3ML ~~LOC~~ SOPN: PEN_INJECTOR | SUBCUTANEOUS | 2 refills | Status: DC

## 2018-10-31 NOTE — Telephone Encounter (Signed)
Patients wife called stating that she will like a RX Victoza sent to [facility] [place] - [address] Please Advise, thanks Ph # [phone]

## 2018-10-31 NOTE — Telephone Encounter (Signed)
This has been sent

## 2018-11-21 ENCOUNTER — Other Ambulatory Visit: Payer: Self-pay | Admitting: Endocrinology

## 2018-11-21 ENCOUNTER — Other Ambulatory Visit (INDEPENDENT_AMBULATORY_CARE_PROVIDER_SITE_OTHER): Payer: Medicare Other

## 2018-11-21 DIAGNOSIS — E1165 Type 2 diabetes mellitus with hyperglycemia: Secondary | ICD-10-CM

## 2018-11-21 DIAGNOSIS — Z794 Long term (current) use of insulin: Secondary | ICD-10-CM

## 2018-11-21 LAB — MICROALBUMIN / CREATININE URINE RATIO
Creatinine,U: 130.9 mg/dL
Microalb Creat Ratio: 63.8 mg/g — ABNORMAL HIGH (ref 0.0–30.0)
Microalb, Ur: 83.5 mg/dL — ABNORMAL HIGH (ref 0.0–1.9)

## 2018-11-21 LAB — BASIC METABOLIC PANEL
BUN: 20 mg/dL (ref 6–23)
CO2: 33 mEq/L — ABNORMAL HIGH (ref 19–32)
Calcium: 9.1 mg/dL (ref 8.4–10.5)
Chloride: 101 mEq/L (ref 96–112)
Creatinine, Ser: 1.44 mg/dL (ref 0.40–1.50)
GFR: 50.45 mL/min — ABNORMAL LOW (ref >60.00)
Glucose, Bld: 106 mg/dL — ABNORMAL HIGH (ref 70–99)
Potassium: 3.5 mEq/L (ref 3.5–5.1)
Sodium: 143 mEq/L (ref 135–145)

## 2018-11-21 LAB — HEMOGLOBIN A1C: Hgb A1c MFr Bld: 7.5 % — ABNORMAL HIGH (ref 4.6–6.5)

## 2018-11-25 ENCOUNTER — Ambulatory Visit: Payer: Medicare Other | Admitting: Endocrinology

## 2018-11-25 ENCOUNTER — Encounter: Payer: Self-pay | Admitting: Endocrinology

## 2018-11-25 ENCOUNTER — Other Ambulatory Visit: Payer: Self-pay

## 2018-11-25 VITALS — BP 115/70 | HR 68 | Ht 69.0 in | Wt 264.0 lb

## 2018-11-25 DIAGNOSIS — E1165 Type 2 diabetes mellitus with hyperglycemia: Secondary | ICD-10-CM

## 2018-11-25 DIAGNOSIS — E1129 Type 2 diabetes mellitus with other diabetic kidney complication: Secondary | ICD-10-CM | POA: Diagnosis not present

## 2018-11-25 DIAGNOSIS — E1142 Type 2 diabetes mellitus with diabetic polyneuropathy: Secondary | ICD-10-CM

## 2018-11-25 DIAGNOSIS — E7800 Pure hypercholesterolemia, unspecified: Secondary | ICD-10-CM

## 2018-11-25 DIAGNOSIS — Z794 Long term (current) use of insulin: Secondary | ICD-10-CM

## 2018-11-25 DIAGNOSIS — R809 Proteinuria, unspecified: Secondary | ICD-10-CM

## 2018-11-25 MED ORDER — GLUCOSE BLOOD VI STRP: ORAL_STRIP | 3 refills | Status: DC

## 2018-11-25 NOTE — Patient Instructions (Signed)
Check blood sugars on waking up 5 days a week  Also check blood sugars about 2 hours after meals and do this after different meals by rotation  Recommended blood sugar levels on waking up are 90-130 and about 2 hours after meal is 130-160  Please bring your blood sugar monitor to each visit, thank you  Must take Humalog before MEALS

## 2018-11-25 NOTE — Progress Notes (Signed)
Patient ID: Randall Wilkins, male   DOB: 28-Jul-1941, 77 y.o.   MRN: [id number]           Reason for Appointment: Follow-up for Type 2 Diabetes   History of Present Illness:          Date of diagnosis of type 2 diabetes mellitus:   1998       Background history:   Has been on insulin since about 2001 He thinks he was given metformin only for a month and may have stopped because of renal dysfunction Also Actos was tried and not clear what side effects he had Does not remember other medication he may have tried but has not tried Victoza or Byetta   Recent history:   INSULIN regimen is:   [place] 56 units at around 8 PM .  Humalog 15-20 units after breakfast and lunch and  25 at dinner     Non-insulin hypoglycemic drugs the patient is taking are: Victoza 1.2 mg daily  His A1c is relatively higher at 7.5, previous range about 7.1-8.1   Current management, blood sugar patterns and problems identified:   Blood sugar has been checked mostly before meals and usually at variable times before his breakfast and some before lunch and dinner  He forgets to check his sugars after supper  Also he now says that he will check his sugar and take his Humalog insulin a few minutes after eating in the evening despite reminders to take the Humalog before eating  FASTING readings are again quite inconsistent with some significantly high readings  He is not clear why some of his morning readings are high but at least this morning it was because of not taking his Humalog last evening  Occasionally may have a relatively higher fat meal or fruit late at night  No overnight hypoglycemia however  Blood sugars are generally somewhat better around lunchtime and suppertime  On his last visit he was told to take 25 minutes consistently at suppertime because of tendency to high readings postprandially, he has not monitored these   Compliance with the medical regimen: Fair  Glucose monitoring:  done  2-3 times a day         Glucometer:  contour      Blood Glucose readings by DOWNLOAD:    PRE-MEAL Fasting Lunch  afternoon  6-8 PM Overall  Glucose range:  107-267 ?   104-219  102-201   Mean/median:  178   163  136  166   POST-MEAL PC Breakfast PC Lunch PC Dinner  Glucose range:    191  Mean/median:      PREVIOUS readings:  PRE-MEAL Fasting Lunch Dinner Bedtime Overall  Glucose range:  90-222   77-119    Mean/median:  155       POST-MEAL PC Breakfast PC Lunch PC Dinner  Glucose range:    169-268  Mean/median:       Self-care:  Meal times are:  Breakfast is at 8 a.m., lunch: 12 noon Dinner: 6 PM   Typical meal intake: Breakfast is sometimes doughnuts or sometimes egg sandwich   lunch is a sandwich or cottage cheese/food, dinner is meat and vegetables/solid    Snacks: Sugar-free Jell-O or pudding, crackers          Dietician visit, most recent: 12/2017               CDE 8/18  Exercise:  unable to do any  Weight history: Range 252-280 previously  Wt Readings from Last 3 Encounters:  11/25/18 264 lb (119.7 kg)  08/25/18 258 lb (117 kg)  05/20/18 259 lb 6.4 oz (117.7 kg)    Glycemic control:   Lab Results  Component Value Date   HGBA1C 7.5 (H) 11/21/2018   HGBA1C 7.1 (H) 08/22/2018   HGBA1C 7.3 (H) 05/17/2018   Lab Results  Component Value Date   MICROALBUR 83.5 (H) 11/21/2018   LDLCALC 40 02/15/2018   CREATININE 1.44 11/21/2018   Lab Results  Component Value Date   MICRALBCREAT 63.8 (H) 11/21/2018    Lab Results  Component Value Date   FRUCTOSAMINE 252 12/17/2017   FRUCTOSAMINE 233 08/09/2017      Allergies as of 11/25/2018      Reactions   Actos [pioglitazone] Swelling   Metformin And Related Nausea Only   Niaspan [niacin Er] Itching, Rash      Medication List       Accurate as of November 25, 2018  6:38 PM. Always use your most recent med list.        allopurinol 300 MG tablet Commonly known as:  ZYLOPRIM Take 1 tablet by mouth  daily. Take 1 tab by my mouth daily   amitriptyline 50 MG tablet Commonly known as:  ELAVIL Take 50 mg by mouth daily.   aspirin EC 81 MG tablet Take 1 tablet (81 mg total) by mouth daily.   colchicine 0.6 MG tablet Take 0.6 mg by mouth daily as needed.   CONTOUR NEXT MONITOR w/Device Kit 1 Device by Does not apply route 3 (three) times daily. Use to check blood sugars 3 times daily. Dx Code E13.9   donepezil 10 MG tablet Commonly known as:  ARICEPT TAKE 1 TABLET BY MOUTH DAILY   glucose blood test strip Commonly known as:  CONTOUR NEXT TEST USE TO CHECK GLUCOSE 4 TIMES DAILY. DX:E11.65   Insulin Degludec 200 UNIT/ML Sopn Commonly known as:  TRESIBA FLEXTOUCH Inject 56 Units into the skin daily. Reports taking 52 units each night.   insulin lispro 100 UNIT/ML injection Commonly known as:  HUMALOG Inject 25-30u in am, 30u at lunch, 30u in evening   levothyroxine 75 MCG tablet Commonly known as:  SYNTHROID, LEVOTHROID   liraglutide 18 MG/3ML Sopn Commonly known as:  VICTOZA INJECT 0.2 ML INTO THE SKIN DAILY   lisinopril 5 MG tablet Commonly known as:  PRINIVIL,ZESTRIL TAKE 1 TABLET BY MOUTH DAILY   metoprolol succinate 25 MG 24 hr tablet Commonly known as:  TOPROL-XL Take 1.5 tablets (37.5 mg total) by mouth daily.   NON FORMULARY Inhale 1 application into the lungs at bedtime. CPAP   potassium chloride SA 20 MEQ tablet Commonly known as:  K-DUR,KLOR-CON Take 20 mEq by mouth 2 (two) times daily.   rosuvastatin 20 MG tablet Commonly known as:  CRESTOR Take 20 mg by mouth daily.   tamsulosin 0.4 MG Caps capsule Commonly known as:  FLOMAX Take 0.4 mg by mouth daily.   torsemide 10 MG tablet Commonly known as:  DEMADEX Take 73m by mouth alternating with 1052m       Allergies:  Allergies  Allergen Reactions  . Actos [Pioglitazone] Swelling  . Metformin And Related Nausea Only  . Niaspan [Niacin Er] Itching and Rash    Past Medical History:    Diagnosis Date  . Aortic stenosis, mild 11/14/2013  . Aortic valve sclerosis 03/29/2015  . Bilateral leg edema 05/21/2014  . CAD (coronary artery disease)   . Chronic diastolic congestive  heart failure ([place])   . Chronic kidney disease (CKD), stage III (moderate) ([facility])   . Diabetes 1.5, managed as type 1 ([place]) 02/04/2013  . Diabetic peripheral neuropathy associated with type 1 diabetes mellitus ([place]) 11/14/2013  . Dysphagia   . Dyspnea on exertion 03/21/2015  . Exogenous obesity   . Gout   . Heart attack ([place])   . History of nuclear stress test 07/2011   dipyridamole; fixed inferolateral defect, worse at stress than rest; no reversible ischemia; low risk scan   . Hyperlipidemia   . Hypertension   . Insulin dependent diabetes mellitus ([place])   . Left foot drop   . Left main coronary artery disease 03/28/2015  . Memory loss   . Obesity (BMI 30-39.9) 11/14/2013  . Obstructive sleep apnea 03/21/2015  . OSA on CPAP    uses a cpap  . Peptic ulcer with hemorrhage 03/28/2015  . Peripheral neuropathy   . Rhabdomyolysis   . 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 Stroke ([place])    L patietal with small scattered lacunar infarcts  . Thrombocytopenia ([place]) 03/21/2015  . Venous insufficiency   . Weakness generalized 03/21/2015    Past Surgical History:  Procedure Laterality Date  . BACK SURGERY  2002   lumbosacral  . Carotid Doppler  03/2013   bilat bulb/prox ICAs - mild amount of fibrous plaque with no evidence of diameter reduction  . CARPAL TUNNEL RELEASE Bilateral 08/09/2014   Procedure: BILATERAL CARPAL TUNNEL RELEASE;  Surgeon: Daryll Brod, MD;  Location: [facility];  Service: Orthopedics;  Laterality: Bilateral;  ANESTHESIA:  IV REGIONAL BIL FAB  . CHOLECYSTECTOMY    . COLONOSCOPY    . CORONARY ANGIOPLASTY  10/13/1996  . CORONARY ANGIOPLASTY  09/21/1989   emergency PTCA  . CORONARY ANGIOPLASTY  10/13/1996   Multi-Link diagonal & OD stenting  (Dr. Marella Chimes)  . CORONARY ANGIOPLASTY  12/03/1997   disease of mid DX-1 ~50% & in mid PLA & PDA (distal lesions) (Dr. Marella Chimes)   . CORONARY ANGIOPLASTY  10/14/1999   progression of disease distal PLA & PDA; progression of disease prox RCA - moderate (Dr. Marella Chimes)   . CORONARY ANGIOPLASTY WITH STENT PLACEMENT  04/04/2004   4.0x22m non-DES (thrombectomy via AngioJet) to RCA for high grade stenosis (Dr. RMarella Chimes  . CORONARY ARTERY BYPASS GRAFT N/A 03/29/2015   Procedure: CORONARY ARTERY BYPASS GRAFTING TIMES TWO USING LEFT INTERNAL MAMMARY ARTERY AND RIGHT LEG GREATER SAPHENOUS VEIN HARVESTED ENDOSCOPICALLY.;  Surgeon: CRexene Alberts MD;  [place]  Service: Open Heart Surgery;  Laterality: N/A;  . ESOPHAGOGASTRODUODENOSCOPY N/A 03/27/2015   Procedure: ESOPHAGOGASTRODUODENOSCOPY (EGD);  Surgeon: MClarene Essex MD;  [facility];  Service: Endoscopy;  Laterality: N/A;  possible dilation  . LEFT HEART CATHETERIZATION WITH CORONARY ANGIOGRAM N/A 03/28/2015   Procedure: LEFT HEART CATHETERIZATION WITH CORONARY ANGIOGRAM;  Surgeon: Peter M JMartinique MD;  [facility];  Service: Cardiovascular;  Laterality: N/A;  . ROTATOR CUFF REPAIR Right   . SINUS ENDO W/FUSION    . TONSILLECTOMY    . TRANSTHORACIC ECHOCARDIOGRAM  08/08/2013   EF 55-60%, mild conc hypertrophy, grade 1 diastolic dysfunction; AV with mild stenosis; LA & RA mildly dilated    Family History  Problem Relation Age of Onset  . Heart disease Mother   . Coronary artery disease Father   . Cancer Maternal Grandmother   . Heart  Problems Maternal Grandfather     Social History:  reports that he quit smoking about 45 years ago. His smoking use included cigarettes. He has a 40.00 pack-year smoking history. He has never used smokeless tobacco. He reports that he does not drink alcohol or use drugs.   Review of Systems   Lipid history: Last LDL  on 20 mg rosuvastatin, followed by PCP    Lab  Results  Component Value Date   CHOL 96 02/15/2018   HDL 37.60 (L) 02/15/2018   LDLCALC 40 02/15/2018   TRIG 93.0 02/15/2018   CHOLHDL 3 02/15/2018           Hypertension: Mild and treated with the 5 mg lisinopril and metoprolol His standing blood pressure was only 269 systolic  CKD: This has been variable He says he has been told that he does not need any follow-up by the nephrologist  Lab Results  Component Value Date   CREATININE 1.44 11/21/2018   CREATININE 1.27 08/22/2018   CREATININE 1.31 02/15/2018    Most recent eye exam was in 2017, Dr. Gershon Crane and reportedly no retinopathy  Most recent foot exam: 4/85  Complications of diabetes: Peripheral neuropathy with sensory loss.  Dementia: Followed by neurologist and is on Aricept   LABS:  Lab on 11/21/2018  Component Date Value Ref Range Status  . Microalb, Ur 11/21/2018 83.5* 0.0 - 1.9 mg/dL Final  . Creatinine,U 11/21/2018 130.9  mg/dL Final  . Microalb Creat Ratio 11/21/2018 63.8* 0.0 - 30.0 mg/g Final  . Sodium 11/21/2018 143  135 - 145 mEq/L Final  . Potassium 11/21/2018 3.5  3.5 - 5.1 mEq/L Final  . Chloride 11/21/2018 101  96 - 112 mEq/L Final  . CO2 11/21/2018 33* 19 - 32 mEq/L Final  . Glucose, Bld 11/21/2018 106* 70 - 99 mg/dL Final  . BUN 11/21/2018 20  6 - 23 mg/dL Final  . Creatinine, Ser 11/21/2018 1.44  0.40 - 1.50 mg/dL Final  . Calcium 11/21/2018 9.1  8.4 - 10.5 mg/dL Final  . GFR 11/21/2018 50.45* >60.00 mL/min Final  . Hgb A1c MFr Bld 11/21/2018 7.5* 4.6 - 6.5 % Final   Glycemic Control Guidelines for People with Diabetes:Non Diabetic:  <6%Goal of Therapy: <7%Additional Action Suggested:  >8%     Physical Examination:  BP 115/70 (Patient Position: Standing, Cuff Size: Large)   Pulse 68   Ht '5\' 9"'  (1.753 m)   Wt 264 lb (119.7 kg)   SpO2 98%   BMI 38.99 kg/m      ASSESSMENT:  Diabetes type 2, BMI 39, insulin-dependent with obesity and history of neuropathy  See history of present  illness for detailed discussion of current diabetes management, blood sugar patterns and problems identified  His A1c is 7.5  Considering his age, duration of diabetes and comorbid conditions his control level is probably adequate However difficult to get his fasting readings consistently controlled He may be getting high readings after his evening meal which he does not monitor Also as discussed above he is taking his Humalog postprandially and this may cause some variability Not able to exercise much  Although he may benefit from metformin his renal function is somewhat variable, currently GFR 50 but gradually decreasing   HYPERTENSION: Blood pressure is controlled, currently only on 5 mg lisinopril Standing blood pressure is only 462 systolic  CKD/microalbuminuria: He is apparently not going to be followed by nephrologist from now 1   PLAN:   He was reminded about needing  to take Humalog consistently BEFORE every meal instead of after No change in [place] as yet To check more readings after supper If he has consistently high readings after evening meal he needs to increase his Humalog to 30 units and also may take this amount for restaurant meals or high fat meals Consider using metformin on the next visit if renal function stable for multiple benefits  Continue Victoza 1.2 mg  May consider increasing lisinopril dose because of his microalbuminuria which is relatively new However blood pressure is low normal today systolic and renal dysfunction may worsen with increasing lisinopril, will defer to PCP  Patient Instructions  Check blood sugars on waking up 5 days a week  Also check blood sugars about 2 hours after meals and do this after different meals by rotation  Recommended blood sugar levels on waking up are 90-130 and about 2 hours after meal is 130-160  Please bring your blood sugar monitor to each visit, thank you  Must take Humalog before MEALS      Total visit  time for evaluation and management of multiple problems and counseling =25 minutes  Elayne Snare 11/25/2018, 6:38 PM   Note: This office note was prepared with Dragon voice recognition system technology. Any transcriptional errors that result from this process are unintentional.

## 2018-11-27 ENCOUNTER — Other Ambulatory Visit: Payer: Self-pay | Admitting: Internal Medicine

## 2018-12-01 ENCOUNTER — Other Ambulatory Visit: Payer: Self-pay | Admitting: Endocrinology

## 2018-12-22 DIAGNOSIS — R6883 Chills (without fever): Secondary | ICD-10-CM | POA: Diagnosis not present

## 2018-12-22 DIAGNOSIS — R197 Diarrhea, unspecified: Secondary | ICD-10-CM | POA: Diagnosis not present

## 2019-01-05 ENCOUNTER — Telehealth: Payer: Self-pay | Admitting: Endocrinology

## 2019-01-05 DIAGNOSIS — E1165 Type 2 diabetes mellitus with hyperglycemia: Secondary | ICD-10-CM | POA: Diagnosis not present

## 2019-01-05 NOTE — Telephone Encounter (Signed)
error 

## 2019-01-24 DIAGNOSIS — H52203 Unspecified astigmatism, bilateral: Secondary | ICD-10-CM | POA: Diagnosis not present

## 2019-01-24 DIAGNOSIS — H5213 Myopia, bilateral: Secondary | ICD-10-CM | POA: Diagnosis not present

## 2019-01-24 DIAGNOSIS — E119 Type 2 diabetes mellitus without complications: Secondary | ICD-10-CM | POA: Diagnosis not present

## 2019-01-24 DIAGNOSIS — Z794 Long term (current) use of insulin: Secondary | ICD-10-CM | POA: Diagnosis not present

## 2019-01-24 LAB — HM DIABETES EYE EXAM

## 2019-01-26 ENCOUNTER — Other Ambulatory Visit: Payer: Self-pay

## 2019-01-26 ENCOUNTER — Ambulatory Visit: Payer: Medicare Other | Admitting: Cardiovascular Disease

## 2019-01-26 ENCOUNTER — Encounter: Payer: Self-pay | Admitting: Cardiovascular Disease

## 2019-01-26 VITALS — BP 148/72 | HR 55 | Ht 69.0 in | Wt 266.0 lb

## 2019-01-26 DIAGNOSIS — I10 Essential (primary) hypertension: Secondary | ICD-10-CM | POA: Diagnosis not present

## 2019-01-26 DIAGNOSIS — G4733 Obstructive sleep apnea (adult) (pediatric): Secondary | ICD-10-CM

## 2019-01-26 DIAGNOSIS — I441 Atrioventricular block, second degree: Secondary | ICD-10-CM

## 2019-01-26 DIAGNOSIS — Z951 Presence of aortocoronary bypass graft: Secondary | ICD-10-CM

## 2019-01-26 DIAGNOSIS — I2510 Atherosclerotic heart disease of native coronary artery without angina pectoris: Secondary | ICD-10-CM | POA: Diagnosis not present

## 2019-01-26 DIAGNOSIS — Z8673 Personal history of transient ischemic attack (TIA), and cerebral infarction without residual deficits: Secondary | ICD-10-CM

## 2019-01-26 MED ORDER — METOPROLOL SUCCINATE ER 25 MG PO TB24: 25.0000 mg | ORAL_TABLET | Freq: Every day | ORAL | 2 refills | Status: DC

## 2019-01-26 NOTE — Progress Notes (Signed)
Cardiology Office Note    Date:  01/28/2019   ID:  Randall Wilkins, DOB 1941/12/12, MRN [id number]  PCP:  [name], MD  Cardiologist:  Shelva Majestic, MD (sleep); Dr. Debara Pickett  New sleep evaluation  History of Present Illness:  Randall Wilkins is a 78 y.o. male who is followed by Dr. Debara Pickett for cardiology care and Dr. Dwyane Dee for diabetes.  He presents for his initial sleep evaluation with me and remotely had undergone his sleep Dr. Thayer Jew Phar's office.  [name] is a former patient of Dr. 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  He has a longstanding history of apparent sleep apnea with an initial sleep study done at [facility] in August 2006.  Apparently, he was found to have severe sleep apnea with an AHI of 37.7.  Reportedly he was started on BiPAP therapy at 14/9 and he was advised not to sleep supine.  Apparently, the patient received a new machine in 2017 and now has an air sense 10 AutoSet unit which is a CPAP rather than BiPAP unit and since that time he has been on a set pressure of 14 cm.  He has had significant difficulties with his mask breaking.  He has not had any sleep follow-up over the last several years.  He now presents to me to assume his sleep apnea treatment and care.  The patient last saw Dr. Debara Pickett in April 2019 and saw Dr. Dwyane Dee in September 2019.  He has a history of prior stroke in 2013 and has residual left foot drop and L4-5 neuropathy for which he wears a brace.  In the office today I obtained a download of his CPAP from December 26, 2018 through January 24, 2019.  He is 100% compliant and is averaging 8 hours and 40 minutes of use per night.  At his 14 cm pressure, apnea hypopnea index is 2.5.  His mask had broken and he was given a mask from his daughter who also had a Mirage Activa LT medium size mask for which she has been using.  He has heated coil tubing.  Presently,  he is unaware of breakthrough snoring.  An Epworth Sleepiness Scale score was calculated in the office today and this endorsed at 10 with his highest chance of dozing while watching TV, lying down to rest in the afternoon when circumstances persist, and sitting quietly after lunch without alcohol with slight chance of dozing while sitting inactive in a public place.  He denies any bruxism, he denies restless legs.  He denies hypnagogic hallucinations.  He presents for initial evaluation.  Past Medical History:  Diagnosis Date  . Aortic stenosis, mild 11/14/2013  . Aortic valve sclerosis 03/29/2015  . Bilateral leg edema 05/21/2014  . CAD (coronary artery disease)   . Chronic diastolic congestive heart failure ([place])   . Chronic kidney disease (CKD), stage III (moderate) (HCC)   . Diabetes 1.5, managed as type 1 ([place]) 02/04/2013  . Diabetic peripheral neuropathy associated with type 1 diabetes mellitus ([place]) 11/14/2013  . Dysphagia   . Dyspnea on exertion 03/21/2015  . Exogenous obesity   . Gout   . Heart attack ([place])   . History of nuclear stress test 07/2011   dipyridamole; fixed inferolateral defect, worse at stress than rest; no reversible ischemia; low risk scan   . Hyperlipidemia   . Hypertension   . Insulin dependent diabetes mellitus ([place])   .  Left foot drop   . Left main coronary artery disease 03/28/2015  . Memory loss   . Obesity (BMI 30-39.9) 11/14/2013  . Obstructive sleep apnea 03/21/2015  . OSA on CPAP    uses a cpap  . Peptic ulcer with hemorrhage 03/28/2015  . Peripheral neuropathy   . Rhabdomyolysis   . 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 Stroke ([place])    L patietal with small scattered lacunar infarcts  . Thrombocytopenia ([place]) 03/21/2015  . Venous insufficiency   . Weakness generalized 03/21/2015    Past Surgical History:  Procedure Laterality Date  . BACK SURGERY  2002   lumbosacral  . Carotid Doppler  03/2013   bilat bulb/prox  ICAs - mild amount of fibrous plaque with no evidence of diameter reduction  . CARPAL TUNNEL RELEASE Bilateral 08/09/2014   Procedure: BILATERAL CARPAL TUNNEL RELEASE;  Surgeon: Daryll Brod, MD;  Location: [facility];  Service: Orthopedics;  Laterality: Bilateral;  ANESTHESIA:  IV REGIONAL BIL FAB  . CHOLECYSTECTOMY    . COLONOSCOPY    . CORONARY ANGIOPLASTY  10/13/1996  . CORONARY ANGIOPLASTY  09/21/1989   emergency PTCA  . CORONARY ANGIOPLASTY  10/13/1996   Multi-Link diagonal & OD stenting (Dr. Marella Chimes)  . CORONARY ANGIOPLASTY  12/03/1997   disease of mid DX-1 ~50% & in mid PLA & PDA (distal lesions) (Dr. Marella Chimes)   . CORONARY ANGIOPLASTY  10/14/1999   progression of disease distal PLA & PDA; progression of disease prox RCA - moderate (Dr. Marella Chimes)   . CORONARY ANGIOPLASTY WITH STENT PLACEMENT  04/04/2004   4.0x11m non-DES (thrombectomy via AngioJet) to RCA for high grade stenosis (Dr. RMarella Chimes  . CORONARY ARTERY BYPASS GRAFT N/A 03/29/2015   Procedure: CORONARY ARTERY BYPASS GRAFTING TIMES TWO USING LEFT INTERNAL MAMMARY ARTERY AND RIGHT LEG GREATER SAPHENOUS VEIN HARVESTED ENDOSCOPICALLY.;  Surgeon: CRexene Alberts MD;  [facility]  Service: Open Heart Surgery;  Laterality: N/A;  . ESOPHAGOGASTRODUODENOSCOPY N/A 03/27/2015   Procedure: ESOPHAGOGASTRODUODENOSCOPY (EGD);  Surgeon: MClarene Essex MD;  [facility];  Service: Endoscopy;  Laterality: N/A;  possible dilation  . LEFT HEART CATHETERIZATION WITH CORONARY ANGIOGRAM N/A 03/28/2015   Procedure: LEFT HEART CATHETERIZATION WITH CORONARY ANGIOGRAM;  Surgeon: Peter M JMartinique MD;  [facility];  Service: Cardiovascular;  Laterality: N/A;  . ROTATOR CUFF REPAIR Right   . SINUS ENDO W/FUSION    . TONSILLECTOMY    . TRANSTHORACIC ECHOCARDIOGRAM  08/08/2013   EF 55-60%, mild conc hypertrophy, grade 1 diastolic dysfunction; AV with mild stenosis; LA & RA mildly dilated    Current  Medications: Outpatient Medications Prior to Visit  Medication Sig Dispense Refill  . allopurinol (ZYLOPRIM) 300 MG tablet Take 1 tablet by mouth daily. Take 1 tab by my mouth daily  2  . amitriptyline (ELAVIL) 50 MG tablet Take 50 mg by mouth daily.    .Marland Kitchenaspirin 81 MG tablet Take 1 tablet (81 mg total) by mouth daily.    . Blood Glucose Monitoring Suppl (CONTOUR NEXT MONITOR) w/Device KIT 1 Device by Does not apply route 3 (three) times daily. Use to check blood sugars 3 times daily. Dx Code E13.9 1 kit 2  . colchicine 0.6 MG tablet Take 0.6 mg by mouth daily as needed.    . donepezil (ARICEPT) 10 MG tablet TAKE 1 TABLET BY MOUTH DAILY 90 tablet 3  .  glucose blood (CONTOUR NEXT TEST) test strip USE TO CHECK GLUCOSE 4 TIMES DAILY. DX:E11.65 125 each 3  . Insulin Degludec (TRESIBA FLEXTOUCH) 200 UNIT/ML SOPN Inject 56 Units into the skin daily. Reports taking 52 units each night. (Patient taking differently: Inject 56 Units into the skin daily. ) 3 pen 3  . insulin lispro (HUMALOG) 100 UNIT/ML injection INJECT 25 TO 30 UNITS UNDER THE SKIN IN THE MORNING, 30 UNITS AT LUNCH AND 30 UNITS IN THE EVENING 80 mL 2  . levothyroxine (SYNTHROID, LEVOTHROID) 75 MCG tablet   1  . liraglutide (VICTOZA) 18 MG/3ML SOPN INJECT 0.2 ML INTO THE SKIN DAILY (Patient taking differently: INJECT 1.'2MG'$   INTO THE SKIN DAILY) 6 pen 2  . lisinopril (PRINIVIL,ZESTRIL) 5 MG tablet TAKE 1 TABLET BY MOUTH DAILY 90 tablet 1  . NON FORMULARY Inhale 1 application into the lungs at bedtime. CPAP    . potassium chloride SA (K-DUR,KLOR-CON) 20 MEQ tablet Take 20 mEq by mouth 2 (two) times daily.     . rosuvastatin (CRESTOR) 20 MG tablet Take 20 mg by mouth daily.    . Tamsulosin HCl (FLOMAX) 0.4 MG CAPS Take 0.4 mg by mouth daily.     Marland Kitchen torsemide (DEMADEX) 10 MG tablet Take '50mg'$  by mouth alternating with '100mg'$ .    . metoprolol succinate (TOPROL-XL) 25 MG 24 hr tablet Take 1.5 tablets (37.5 mg total) by mouth daily. 135 tablet 3    No facility-administered medications prior to visit.      Allergies:   Actos [pioglitazone]; Metformin and related; and Niaspan [niacin er]   Social History   Socioeconomic History  . Marital status: Married    Spouse name: Not on file  . Number of children: 3  . Years of education: 41  . Highest education level: Not on file  Occupational History  . Not on file  Social Needs  . Financial resource strain: Not on file  . Food insecurity:    Worry: Not on file    Inability: Not on file  . Transportation needs:    Medical: Not on file    Non-medical: Not on file  Tobacco Use  . Smoking status: Former Smoker    Packs/day: 2.00    Years: 20.00    Pack years: 40.00    Types: Cigarettes    Last attempt to quit: 01/25/1973    Years since quitting: 46.0  . Smokeless tobacco: Never Used  Substance and Sexual Activity  . Alcohol use: No    Alcohol/week: 0.0 standard drinks  . Drug use: No  . Sexual activity: Not on file  Lifestyle  . Physical activity:    Days per week: Not on file    Minutes per session: Not on file  . Stress: Not on file  Relationships  . Social connections:    Talks on phone: Not on file    Gets together: Not on file    Attends religious service: Not on file    Active member of club or organization: Not on file    Attends meetings of clubs or organizations: Not on file    Relationship status: Not on file  Other Topics Concern  . Not on file  Social History Narrative   Lives with wife.      Family History:  The patient's family history includes Cancer in his maternal grandmother; Coronary artery disease in his father; Heart Problems in his maternal grandfather; Heart disease in his mother.   ROS General: Negative;  No fevers, chills, or night sweats;  HEENT: Negative; No changes in vision or hearing, sinus congestion, difficulty swallowing Pulmonary: Negative; No cough, wheezing, shortness of breath, hemoptysis Cardiovascular: Negative; No chest  pain, presyncope, syncope, palpitations GI: Negative; No nausea, vomiting, diarrhea, or abdominal pain GU: Negative; No dysuria, hematuria, or difficulty voiding Musculoskeletal: Negative; no myalgias, joint pain, or weakness Hematologic/Oncology: Negative; no easy bruising, bleeding Endocrine: Negative; no heat/cold intolerance; no diabetes Neuro: History of CVA, peripheral neuropathy  Skin: Negative; No rashes or skin lesions Psychiatric: Negative; No behavioral problems, depression Sleep: See HPI  other comprehensive 14 point system review is negative.   PHYSICAL EXAM:   VS:  BP (!) 148/72   Pulse (!) 55   Ht '5\' 9"'$  (1.753 m)   Wt 266 lb (120.7 kg)   BMI 39.28 kg/m     Repeat blood pressure by me was 140/70  Wt Readings from Last 3 Encounters:  01/26/19 266 lb (120.7 kg)  11/25/18 264 lb (119.7 kg)  08/25/18 258 lb (117 kg)    General: Alert, oriented, no distress.  Skin: normal turgor, no rashes, warm and dry HEENT: Normocephalic, atraumatic. Pupils equal round and reactive to light; sclera anicteric; extraocular muscles intact; fundi: Without hemorrhages or exudates. Nose: Rhinophyma  without nasal septal hypertrophy Mouth/Parynx benign; Mallinpatti scale Neck: No JVD, no carotid bruits; normal carotid upstroke Lungs: clear to ausculatation and percussion; no wheezing or rales Chest wall: without tenderness to palpitation Heart: PMI not displaced, RRR, s1 s2 normal, 1/6 systolic murmur, no diastolic murmur, no rubs, gallops, thrills, or heaves Abdomen: soft, nontender; no hepatosplenomehaly, BS+; abdominal aorta nontender and not dilated by palpation. Back: no CVA tenderness Pulses 2+ Musculoskeletal: full range of motion, normal strength, no joint deformities Extremities: no clubbing cyanosis or edema, Homan's sign negative  Neurologic: grossly nonfocal; Cranial nerves grossly wnl Psychologic: Normal mood and affect   Studies/Labs Reviewed:   EKG:  EKG is ordered  today.  ECG (independently read by me): Probable second-degree type I Wenckebach block with PR prolongation to greater than 400 ms.  Nonspecific ST changes.  Regular rate approximately 55 bpm.  Recent Labs: BMP Latest Ref Rng & Units 11/21/2018 08/22/2018 05/17/2018  Glucose 70 - 99 mg/dL 106(H) 97 119(H)  BUN 6 - 23 mg/dL 20 15 -  Creatinine 0.40 - 1.50 mg/dL 1.44 1.27 -  Sodium 135 - 145 mEq/L 143 142 -  Potassium 3.5 - 5.1 mEq/L 3.5 3.9 -  Chloride 96 - 112 mEq/L 101 103 -  CO2 19 - 32 mEq/L 33(H) 32 -  Calcium 8.4 - 10.5 mg/dL 9.1 9.1 -     Hepatic Function Latest Ref Rng & Units 03/29/2015 03/23/2015 03/21/2015  Total Protein 6.0 - 8.3 g/dL 5.3(L) 5.9(L) 6.2  Albumin 3.5 - 5.2 g/dL 2.4(L) 2.6(L) 3.2(L)  AST 0 - 37 U/L 29 67(H) 22  ALT 0 - 53 U/L '29 22 14  '$ Alk Phosphatase 39 - 117 U/L 62 64 69  Total Bilirubin 0.3 - 1.2 mg/dL 0.7 1.1 1.5(H)    CBC Latest Ref Rng & Units 04/05/2015 04/02/2015 04/01/2015  WBC 4.0 - 10.5 K/uL 13.0(H) 12.0(H) 16.3(H)  Hemoglobin 13.0 - 17.0 g/dL 10.7(L) 9.8(L) 9.6(L)  Hematocrit 39.0 - 52.0 % 33.3(L) 29.3(L) 28.7(L)  Platelets 150 - 400 K/uL 280 230 191   Lab Results  Component Value Date   MCV 90.2 04/05/2015   MCV 88.8 04/02/2015   MCV 89.1 04/01/2015   No results found for: TSH Lab  Results  Component Value Date   HGBA1C 7.5 (H) 11/21/2018     BNP    Component Value Date/Time   BNP 733.8 (H) 03/23/2015 1628    ProBNP No results found for: PROBNP   Lipid Panel     Component Value Date/Time   CHOL 96 02/15/2018 0931   TRIG 93.0 02/15/2018 0931   HDL 37.60 (L) 02/15/2018 0931   CHOLHDL 3 02/15/2018 0931   VLDL 18.6 02/15/2018 0931   LDLCALC 40 02/15/2018 0931     RADIOLOGY: No results found.   Additional studies/ records that were reviewed today include:   I reviewed the patient's sleep study from [facility] dated July 02, 2005.  I have obtained a download from most recent CPAP use.   ASSESSMENT:    1. OSA  (obstructive sleep apnea)   2. Essential hypertension   3. Coronary artery disease involving native coronary artery of native heart without angina pectoris   4. Hx of CABG   5. Second degree atrioventricular block, Mobitz (type) I   6. History of stroke     PLAN:  Mr. Sante Wilkins is a 79 year old patient who has significant cardiovascular comorbidities including CAD, left parietal stroke, status post CABG revascularization surgery, diabetes mellitus, and has been documented to have severe obstructive sleep apnea since his initial evaluation in 2006.  Apparently he was on BiPAP therapy but in 2017 received a new machine and he now has a ResMed air sense 10 AutoSet CPAP unit rather than BiPAP.  I reviewed his most recent download.  He is 100% compliant.  AHI is 2.5 may be contributed by significant mask leak. He recently had difficulty with his mask breaking but he has received a similar mask which was his daughters and he continues to use a Mirage Activa LT medium size mask.  I reviewed with him potential new options for mask particularly the new ResMed airfit F 30i which he may be better for added comfort and this may be an option in the future.  He has issues with full edema and continues to take torsemide.  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  I have recommended  close follow-up with Dr. Debara Pickett in the next several weeks.  I will see him in 1 year for sleep reevaluation..   Medication Adjustments/Labs and Tests Ordered: Current medicines are reviewed at length with the patient today.  Concerns regarding medicines are outlined above.  Medication changes, Labs and Tests ordered today are listed in the Patient Instructions below. Patient Instructions  Medication Instructions:  Change Metoprolol to 25 mg (1 tablet)  If you need a refill on your  cardiac medications before your next appointment, please call your pharmacy.    Follow-Up: At [facility], you and your health needs are our priority.  As part of our continuing mission to provide you with exceptional heart care, we have created designated Provider Care Teams.  These Care Teams include your primary Cardiologist (physician) and Advanced Practice Providers (APPs -  Physician Assistants and Nurse Practitioners) who all work together to provide you with the care you need, when you need it. . Follow up with Dr.Hilty in 2 week. . Follow up with Dr.Kelly (sleep) in 12 months.         Signed, Shelva Majestic, MD  01/28/2019 3:26 PM  [facility] [address] [place], [place], [place]  [address] Phone: [phone]

## 2019-01-26 NOTE — Patient Instructions (Addendum)
Medication Instructions:  Change Metoprolol to 25 mg (1 tablet)  If you need a refill on your cardiac medications before your next appointment, please call your pharmacy.    Follow-Up: At [facility], you and your health needs are our priority.  As part of our continuing mission to provide you with exceptional heart care, we have created designated Provider Care Teams.  These Care Teams include your primary Cardiologist (physician) and Advanced Practice Providers (APPs -  Physician Assistants and Nurse Practitioners) who all work together to provide you with the care you need, when you need it. . Follow up with Dr.Hilty in 2 week. . Follow up with Dr.Kelly (sleep) in 12 months.

## 2019-01-28 ENCOUNTER — Encounter: Payer: Self-pay | Admitting: Cardiovascular Disease

## 2019-01-28 NOTE — Addendum Note (Signed)
Addended by: Shelva Majestic A on: 01/28/2019 03:28 PM   Modules accepted: Level of Service

## 2019-02-07 ENCOUNTER — Ambulatory Visit: Payer: Medicare Other | Admitting: Internal Medicine

## 2019-02-07 ENCOUNTER — Encounter: Payer: Self-pay | Admitting: Internal Medicine

## 2019-02-07 VITALS — BP 155/71 | HR 68 | Ht 69.0 in | Wt 260.0 lb

## 2019-02-07 DIAGNOSIS — I2510 Atherosclerotic heart disease of native coronary artery without angina pectoris: Secondary | ICD-10-CM

## 2019-02-07 DIAGNOSIS — G4733 Obstructive sleep apnea (adult) (pediatric): Secondary | ICD-10-CM | POA: Diagnosis not present

## 2019-02-07 DIAGNOSIS — Z951 Presence of aortocoronary bypass graft: Secondary | ICD-10-CM

## 2019-02-07 DIAGNOSIS — I10 Essential (primary) hypertension: Secondary | ICD-10-CM | POA: Diagnosis not present

## 2019-02-07 NOTE — Patient Instructions (Signed)
Medication Instructions:  Continue same medications If you need a refill on your cardiac medications before your next appointment, please call your pharmacy.   Lab work: None ordered   Testing/Procedures: None ordered  Follow-Up: At [facility], you and your health needs are our priority.  As part of our continuing mission to provide you with exceptional heart care, we have created designated Provider Care Teams.  These Care Teams include your primary Cardiologist (physician) and Advanced Practice Providers (APPs -  Physician Assistants and Nurse Practitioners) who all work together to provide you with the care you need, when you need it. . Follow Up with Dr.Hilty in 6 months  Call 3 months before to schedule

## 2019-02-07 NOTE — Progress Notes (Signed)
OFFICE NOTE  Chief Complaint:  No complaints  Primary Care Physician: Randall Arabian, MD  HPI:  Randall Wilkins is a pleasant 78 year old male patient who was formerly followed by Randall Wilkins. He is here to establish cardiac care with [place] today. He has a history of obesity, multiple comorbidities including insulin-dependent diabetes, diabetic peripheral neuropathy, diabetic nephropathy, venous insufficiency and peripheral neuropathy. He also has obstructive sleep apnea on CPAP and coronary artery disease. He unfortunately suffered a left parietal stroke with small lacunar infarcts noted on MRI in 2013. He is a left foot drop from an L4/L5 neuropathy and wears a brace for this. From a cardiac standpoint he had a large non-drug-eluting stent placed in the RCA in 2005. He has had a stent placed at 97 to a diagonal which was noted to be patent. He's had some problems with dizziness which is improved. He [place] as large to medium secondary to neuropathy and venous insufficiency. Unfortunately not been able to lose a lot of weight due to his difficulty in ambulating and painful neuropathy.  He denies any cardiac chest pain or worsening shortness of breath.  [name] for a six-month followup. He is having no complaints. He does report some lower extremity swelling which is stable. He has mild aortic stenosis which is been stable as well. His last echo was in August 2014. He denies any chest pain or worsening shortness of breath. He recently had lab work to Randall Wilkins office which we will obtain.  I saw [name] today for hospital follow-up. He is reportedly doing much better. He has more energy and no significant chest discomfort. Between our last appointment see presented with a urinary tract infection and ultimately had chest pain and profuse diaphoresis. He was found to have elevated troponin and underwent cardiac catheterization. This demonstrated the following:  Coronary  angiography: Coronary dominance: right  Left mainstem: 95% distal left main stenosis.  Left anterior descending (LAD): There is 95% ostial LAD stenosis. The LAD is a large vessel. The LAD and diagonal otherwise are without significant disease.  There is a large ramus intermediate branch with 99% ostial stenosis.  Left circumflex (LCx): 100% ostial occlusion with faint left to left collaterals.  Right coronary artery (RCA): the RCA is a large dominant branch. The stent in the proximal vessel is widely patent. There is a 60% stenosis in the mid PDA. The third PLOM is moderate in size with a long 90-95% stenosis prior to distal arborization.   Subsequently he underwent emergent coronary artery bypass grafting with a LIMA to LAD and SVG to OM branch. He is noted to have mild aortic sclerosis versus stenosis, however the valve was not severe enough to be addressed. Since discharge he is more active and is starting cardiac rehabilitation today.  [name] for follow-up. Overall he is feeling well and continues to improve in his ambulation and stamina. He denies any chest pain or shortness of breath. He is rehabilitating well. Recent laboratory work shows excellent cholesterol control and total cholesterol 130, HDL 42 LDL 61 and triglycerides 137. His only concern is the cost of medications.  I saw [name] today in the office. He denies any chest pain or worsening shortness of breath. Unfortunately he's had about several weeks of diarrhea which started when he went to the [facility] area. It was not felt to be infectious any seeing a gastroenterologist about this. Otherwise he is without cardiac  complaints. Blood pressure today was low at 114/52. He's managed to lose only a couple of pounds but generally his appetite stools been pretty good despite the diarrhea. He is planning on having an upcoming injection and I received a request to hold his Plavix for 5 days prior to that. He  is currently holding it will have his injection on Monday. Based on this I reviewed his records and I do not see a clear ongoing indication for the Plavix in addition to aspirin. Now that he's been surgically revascularized.   03/04/2017  [name] for follow-up. Recently he's had some dizziness and apparently had lower blood pressure. He backed off on his lisinopril to 2.5 mg daily, or at least he says he is taking a half tablet. Will try to figure out what the dose was. EKG today shows sinus bradycardia with either a first degree AV block of great significance or possibly atrial fibrillation. It's difficult to see consistent P waves with normal PP intervals. The RR intervals are slightly irregular. He has had sinus with a long first-degree AV block of 340 ms in the past. This is important to determine as he has had prior stroke and would necessitate anticoagulation.  03/23/2018  [name] for follow-up.  He has no specific complaints.  He continues to have a little instability with his gait.  He is using a cane today but has a walker at home.  Recently he has had the best blood sugars that he has had in a while.  He started seeing Randall Wilkins, who added Victoza.  He is also on [place] and Humalog.  He was in a clinical trial with [employer], and is to be considered given his history of coronary disease and diabetes for mortality benefit.  I will defer to Randall Wilkins.  Blood pressure is well controlled today.  Weight remains excessive.  EKG personally reviewed shows sinus rhythm with PVCs for which he is unaware of.  02/07/2019  Randall Wilkins returns today for follow-up.  He is done well and has no new complaints.  Recently seen by Randall Wilkins and adjustments were made to his CPAP.  He is reportedly 100% compliant.  He was noted to have a type I winky block on EKG a couple weeks ago and his Toprol-XL was reduced from 37.5 to 25 mg.  He seems to be tolerating this however blood pressures  accordingly gone up.  Blood pressure today was 155/71 and home blood pressures are in the 150s.  PMHx:  Past Medical History:  Diagnosis Date  . Aortic stenosis, mild 11/14/2013  . Aortic valve sclerosis 03/29/2015  . Bilateral leg edema 05/21/2014  . CAD (coronary artery disease)   . Chronic diastolic congestive heart failure ([place])   . Chronic kidney disease (CKD), stage III (moderate) (HCC)   . Diabetes 1.5, managed as type 1 ([place]) 02/04/2013  . Diabetic peripheral neuropathy associated with type 1 diabetes mellitus ([place]) 11/14/2013  . Dysphagia   . Dyspnea on exertion 03/21/2015  . Exogenous obesity   . Gout   . Heart attack ([place])   . History of nuclear stress test 07/2011   dipyridamole; fixed inferolateral defect, worse at stress than rest; no reversible ischemia; low risk scan   . Hyperlipidemia   . Hypertension   . Insulin dependent diabetes mellitus ([place])   . Left foot drop   . Left main coronary artery disease 03/28/2015  . Memory loss   . Obesity (BMI 30-39.9)  11/14/2013  . Obstructive sleep apnea 03/21/2015  . OSA on CPAP    uses a cpap  . Peptic ulcer with hemorrhage 03/28/2015  . Peripheral neuropathy   . Rhabdomyolysis   . 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 Stroke ([place])    L patietal with small scattered lacunar infarcts  . Thrombocytopenia ([place]) 03/21/2015  . Venous insufficiency   . Weakness generalized 03/21/2015    Past Surgical History:  Procedure Laterality Date  . BACK SURGERY  2002   lumbosacral  . Carotid Doppler  03/2013   bilat bulb/prox ICAs - mild amount of fibrous plaque with no evidence of diameter reduction  . CARPAL TUNNEL RELEASE Bilateral 08/09/2014   Procedure: BILATERAL CARPAL TUNNEL RELEASE;  Surgeon: Daryll Brod, MD;  Location: [facility];  Service: Orthopedics;  Laterality: Bilateral;  ANESTHESIA:  IV REGIONAL BIL FAB  . CHOLECYSTECTOMY    . COLONOSCOPY    . CORONARY ANGIOPLASTY  10/13/1996    . CORONARY ANGIOPLASTY  09/21/1989   emergency PTCA  . CORONARY ANGIOPLASTY  10/13/1996   Multi-Link diagonal & OD stenting (Dr. Marella Chimes)  . CORONARY ANGIOPLASTY  12/03/1997   disease of mid DX-1 ~50% & in mid PLA & PDA (distal lesions) (Dr. Marella Chimes)   . CORONARY ANGIOPLASTY  10/14/1999   progression of disease distal PLA & PDA; progression of disease prox RCA - moderate (Dr. Marella Chimes)   . CORONARY ANGIOPLASTY WITH STENT PLACEMENT  04/04/2004   4.0x52m non-DES (thrombectomy via AngioJet) to RCA for high grade stenosis (Dr. RMarella Chimes  . CORONARY ARTERY BYPASS GRAFT N/A 03/29/2015   Procedure: CORONARY ARTERY BYPASS GRAFTING TIMES TWO USING LEFT INTERNAL MAMMARY ARTERY AND RIGHT LEG GREATER SAPHENOUS VEIN HARVESTED ENDOSCOPICALLY.;  Surgeon: CRexene Alberts MD;  [place] [facility]  Service: Open Heart Surgery;  Laterality: N/A;  . ESOPHAGOGASTRODUODENOSCOPY N/A 03/27/2015   Procedure: ESOPHAGOGASTRODUODENOSCOPY (EGD);  Surgeon: MClarene Essex MD;  [facility];  Service: Endoscopy;  Laterality: N/A;  possible dilation  . LEFT HEART CATHETERIZATION WITH CORONARY ANGIOGRAM N/A 03/28/2015   Procedure: LEFT HEART CATHETERIZATION WITH CORONARY ANGIOGRAM;  Surgeon: Peter M JMartinique MD;  [facility];  Service: Cardiovascular;  Laterality: N/A;  . ROTATOR CUFF REPAIR Right   . SINUS ENDO W/FUSION    . TONSILLECTOMY    . TRANSTHORACIC ECHOCARDIOGRAM  08/08/2013   EF 55-60%, mild conc hypertrophy, grade 1 diastolic dysfunction; AV with mild stenosis; LA & RA mildly dilated    FAMHx:  Family History  Problem Relation Age of Onset  . Heart disease Mother   . Coronary artery disease Father   . Cancer Maternal Grandmother   . Heart Problems Maternal Grandfather     SOCHx:   reports that he quit smoking about 46 years ago. His smoking use included cigarettes. He has a 40.00 pack-year smoking history. He has never used smokeless tobacco. He reports that he does not drink  alcohol or use drugs.  ALLERGIES:  Allergies  Allergen Reactions  . Actos [Pioglitazone] Swelling  . Metformin And Related Nausea Only  . Niaspan [Niacin Er] Itching and Rash    ROS: Pertinent items noted in HPI and remainder of comprehensive ROS otherwise negative.  HOME MEDS: Current Outpatient Medications  Medication Sig Dispense Refill  . allopurinol (ZYLOPRIM) 300 MG tablet Take 1 tablet by mouth daily. Take 1 tab by my mouth daily  2  .  amitriptyline (ELAVIL) 50 MG tablet Take 50 mg by mouth daily.    Marland Kitchen aspirin 81 MG tablet Take 1 tablet (81 mg total) by mouth daily.    . Blood Glucose Monitoring Suppl (CONTOUR NEXT MONITOR) w/Device KIT 1 Device by Does not apply route 3 (three) times daily. Use to check blood sugars 3 times daily. Dx Code E13.9 1 kit 2  . colchicine 0.6 MG tablet Take 0.6 mg by mouth daily as needed.    . donepezil (ARICEPT) 10 MG tablet TAKE 1 TABLET BY MOUTH DAILY 90 tablet 3  . glucose blood (CONTOUR NEXT TEST) test strip USE TO CHECK GLUCOSE 4 TIMES DAILY. DX:E11.65 125 each 3  . Insulin Degludec (TRESIBA FLEXTOUCH) 200 UNIT/ML SOPN Inject 56 Units into the skin daily. Reports taking 52 units each night. (Patient taking differently: Inject 56 Units into the skin daily. ) 3 pen 3  . insulin lispro (HUMALOG) 100 UNIT/ML injection INJECT 25 TO 30 UNITS UNDER THE SKIN IN THE MORNING, 30 UNITS AT LUNCH AND 30 UNITS IN THE EVENING 80 mL 2  . levothyroxine (SYNTHROID, LEVOTHROID) 75 MCG tablet   1  . liraglutide (VICTOZA) 18 MG/3ML SOPN INJECT 0.2 ML INTO THE SKIN DAILY (Patient taking differently: INJECT 1.2MG  INTO THE SKIN DAILY) 6 pen 2  . lisinopril (PRINIVIL,ZESTRIL) 5 MG tablet TAKE 1 TABLET BY MOUTH DAILY 90 tablet 1  . metoprolol succinate (TOPROL-XL) 25 MG 24 hr tablet Take 1 tablet (25 mg total) by mouth daily. 90 tablet 2  . NON FORMULARY Inhale 1 application into the lungs at bedtime. CPAP    . potassium chloride SA (K-DUR,KLOR-CON) 20 MEQ tablet Take  20 mEq by mouth 2 (two) times daily.     . rosuvastatin (CRESTOR) 20 MG tablet Take 20 mg by mouth daily.    . Tamsulosin HCl (FLOMAX) 0.4 MG CAPS Take 0.4 mg by mouth daily.     Marland Kitchen torsemide (DEMADEX) 10 MG tablet Take 65m by mouth alternating with 1023m     No current facility-administered medications for this visit.     LABS/IMAGING: No results found for this or any previous visit (from the past 48 hour(s)). No results found.  VITALS: BP (!) 155/71   Pulse 68   Ht '5\' 9"'  (1.753 m)   Wt 260 lb (117.9 kg)   SpO2 96%   BMI 38.40 kg/m   EXAM: General appearance: alert and no distress Neck: no carotid bruit and no JVD Lungs: clear to auscultation bilaterally Heart: regular rate and rhythm, S1, S2 normal, systolic murmur: 3/6 early to mid-peaking systolic murmur 3/6, crescendo at 2nd right intercostal space and occasional missed beats Abdomen: obese, soft, non-tender, +BS Extremities: edema trace Pulses: 2+ and symmetric Skin: Skin color, texture, turgor normal. No rashes or lesions Neurologic: Mental status: Alert, oriented, thought content appropriate Psych: Pleasant mood, affect  EKG: Deferred  ASSESSMENT: 1. Coronary artery disease status post BMS to the RCA in 2005, recent CABG 2 (LIMA to LAD - for critical left main disease, SVG to OM1)  2. Obesity 3. Hypertension-controlled 4. Dyslipidemia 5. Prior stroke 6. Diabetic neuropathy 7. Diabetic nephropathy - left foot drop 8. Type 1 diabetes on insulin 9. LE edema 10. Mild aortic stenosis  PLAN: 1.   Mr. JoMartiniqueas had an increase in heart rate after decreasing his beta-blocker but his blood pressure remains elevated.  Blood pressure readings at home are similar but perhaps slightly improved.  I encouraged him to continue to  follow this.  He has an upcoming appointment with his endocrinologist in March.  We will see what his blood pressure looks like then.  No other changes were made to his medications today.   Follow-up with me in 6 months or sooner as necessary.  Pixie Casino, MD, [facility], [place] Director of the Advanced Lipid Disorders &  Cardiovascular Risk Reduction Clinic Diplomate of the American Board of Clinical Lipidology Attending Cardiologist  Direct Dial: [phone]  Fax: [phone]  Website:  [email or web address] 02/07/2019, 3:41 PM

## 2019-02-09 ENCOUNTER — Telehealth: Payer: Self-pay | Admitting: Endocrinology

## 2019-02-09 NOTE — Telephone Encounter (Signed)
Patient is going to see his PCP on Monday 02/13/2019 and wants to know if his lab work is done at the PCP office on that date can it be sent to Dr Dwyane Dee instead of patient coming into our office for labs on 02/17/2019  Please let them know, if no answer OK to leave message on machine

## 2019-02-09 NOTE — Telephone Encounter (Signed)
Pt was advised that he can have his bloodwork done by PCP and faxed to this office, provided PCP is doing the same blood work Dr. Dwyane Dee is needing. Pt was given the names of the labwork that Dr. Dwyane Dee has ordered to see if Dr. Marisue Humble will draw these as well, if not already ordered by Dr. Marisue Humble, and then faxed to this office.

## 2019-02-11 ENCOUNTER — Encounter: Payer: Self-pay | Admitting: Internal Medicine

## 2019-02-13 ENCOUNTER — Other Ambulatory Visit: Payer: Self-pay | Admitting: Endocrinology

## 2019-02-13 DIAGNOSIS — I2510 Atherosclerotic heart disease of native coronary artery without angina pectoris: Secondary | ICD-10-CM | POA: Diagnosis not present

## 2019-02-13 DIAGNOSIS — I10 Essential (primary) hypertension: Secondary | ICD-10-CM | POA: Diagnosis not present

## 2019-02-13 DIAGNOSIS — Z0000 Encounter for general adult medical examination without abnormal findings: Secondary | ICD-10-CM | POA: Diagnosis not present

## 2019-02-13 DIAGNOSIS — E039 Hypothyroidism, unspecified: Secondary | ICD-10-CM | POA: Diagnosis not present

## 2019-02-13 LAB — HEMOGLOBIN A1C: Hemoglobin A1C: 7.1

## 2019-02-13 LAB — MICROALBUMIN, URINE: Microalb, Ur: 47.46

## 2019-02-15 DIAGNOSIS — E1165 Type 2 diabetes mellitus with hyperglycemia: Secondary | ICD-10-CM | POA: Diagnosis not present

## 2019-02-17 ENCOUNTER — Other Ambulatory Visit: Payer: Medicare Other

## 2019-02-21 ENCOUNTER — Ambulatory Visit: Payer: Medicare Other | Admitting: Endocrinology

## 2019-02-21 ENCOUNTER — Other Ambulatory Visit: Payer: Self-pay

## 2019-02-21 ENCOUNTER — Encounter: Payer: Self-pay | Admitting: Endocrinology

## 2019-02-21 VITALS — BP 140/70 | HR 64 | Ht 69.0 in | Wt 260.2 lb

## 2019-02-21 DIAGNOSIS — E1165 Type 2 diabetes mellitus with hyperglycemia: Secondary | ICD-10-CM | POA: Diagnosis not present

## 2019-02-21 DIAGNOSIS — E1142 Type 2 diabetes mellitus with diabetic polyneuropathy: Secondary | ICD-10-CM | POA: Diagnosis not present

## 2019-02-21 DIAGNOSIS — N183 Chronic kidney disease, stage 3 unspecified: Secondary | ICD-10-CM

## 2019-02-21 DIAGNOSIS — Z794 Long term (current) use of insulin: Secondary | ICD-10-CM

## 2019-02-21 NOTE — Patient Instructions (Addendum)
Take Humalog 15 at lunch not 20 Must take this just before meals  T[place]jeo: 60 units daily and 2-4 more if am sugar stays>150  Tresiba 58 units daily

## 2019-02-21 NOTE — Progress Notes (Signed)
Patient ID: Randall Wilkins, male   DOB: 1941-07-04, 78 y.o.   MRN: [id number]           Reason for Appointment: Follow-up for Type 2 Diabetes   History of Present Illness:          Date of diagnosis of type 2 diabetes mellitus:   1998       Background history:   Has been on insulin since about 2001 He thinks he was given metformin only for a month and may have stopped because of renal dysfunction Also Actos was tried and not clear what side effects he had Does not remember other medication he may have tried but has not tried Victoza or Byetta   Recent history:   INSULIN regimen is:   [place] 56 units at around 8 PM .  Humalog 15-20 units after breakfast and 20-25 after lunch and  25 at dinner     Non-insulin hypoglycemic drugs the patient is taking are: Victoza 1.2 mg daily  His A1c is relatively better at 7.1 done through PCP office His previous range about 7.1-8.1   Current management, blood sugar patterns and problems identified:   Blood sugar readings are still the highest before breakfast  However his blood sugars fluctuate and have been as low as 107 in the morning  Despite reminders he does not check his readings after supper usually and does have a couple of high readings  His wife who has been helping him with his insulin said that he does not want to take his insulin before eating and will take it a few minutes after his meal  His lunch meal is usually smaller except occasionally on Sundays  With this his blood sugars are the lowest before suppertime and he has had a couple of readings in the 60s before dinnertime  His weight is down slightly but he did have a viral infection  Usually not adjusting his mealtime dose especially in the evening based on what he is eating  However his wife and cut back his lunchtime dose to 10 to 12 units if eating a very small meal   Compliance with the medical regimen: Fair  Glucose monitoring:  done 2-3 times a day          Glucometer:  contour      Blood Glucose readings by DOWNLOAD:    PRE-MEAL Fasting Lunch Dinner Bedtime Overall  Glucose range:  108-227  90-279  67-210  153, 239   Mean/median:  162  144  116  141   POST-MEAL PC Breakfast PC Lunch PC Dinner  Glucose range:    173  Mean/median:      PREVIOUS readings:  PRE-MEAL Fasting Lunch  afternoon  6-8 PM Overall  Glucose range:  107-267 ?   104-219  102-201   Mean/median:  178   163  136  166   POST-MEAL PC Breakfast PC Lunch PC Dinner  Glucose range:    191  Mean/median:        Self-care:  Meal times are:  Breakfast is at 8 a.m., lunch: 12 noon Dinner: 6 PM   Typical meal intake: Breakfast is sometimes doughnuts or sometimes egg sandwich   lunch is a sandwich or cottage cheese/food, dinner is meat and vegetables/solid    Snacks: Sugar-free Jell-O or pudding, crackers          Dietician visit, most recent: 12/2017  CDE 8/18  Exercise:  unable to do any  Weight history: Range 252-280 previously  Wt Readings from Last 3 Encounters:  02/21/19 260 lb 3.2 oz (118 kg)  02/07/19 260 lb (117.9 kg)  01/26/19 266 lb (120.7 kg)    Glycemic control:   Lab Results  Component Value Date   HGBA1C 7.1 02/13/2019   HGBA1C 7.5 (H) 11/21/2018   HGBA1C 7.1 (H) 08/22/2018   Lab Results  Component Value Date   MICROALBUR 47.46 02/13/2019   LDLCALC 40 02/15/2018   CREATININE 1.44 11/21/2018   Lab Results  Component Value Date   MICRALBCREAT 63.8 (H) 11/21/2018    Lab Results  Component Value Date   FRUCTOSAMINE 252 12/17/2017   FRUCTOSAMINE 233 08/09/2017      Allergies as of 02/21/2019      Reactions   Actos [pioglitazone] Swelling   Metformin And Related Nausea Only   Niaspan [niacin Er] Itching, Rash      Medication List       Accurate as of February 21, 2019 10:04 AM. Always use your most recent med list.        allopurinol 300 MG tablet Commonly known as:  ZYLOPRIM Take 1 tablet by mouth daily. Take  1 tab by my mouth daily   amitriptyline 50 MG tablet Commonly known as:  ELAVIL Take 50 mg by mouth daily.   aspirin EC 81 MG tablet Take 1 tablet (81 mg total) by mouth daily.   colchicine 0.6 MG tablet Take 0.6 mg by mouth daily as needed.   Contour Next Monitor w/Device Kit 1 Device by Does not apply route 3 (three) times daily. Use to check blood sugars 3 times daily. Dx Code E13.9   donepezil 10 MG tablet Commonly known as:  ARICEPT TAKE 1 TABLET BY MOUTH DAILY   glucose blood test strip Commonly known as:  Contour Next Test USE TO CHECK GLUCOSE 4 TIMES DAILY. DX:E11.65   Insulin Degludec 200 UNIT/ML Sopn Commonly known as:  [place] FlexTouch Inject 56 Units into the skin daily. Reports taking 52 units each night.   insulin lispro 100 UNIT/ML injection Commonly known as:  HumaLOG INJECT 25 TO 30 UNITS UNDER THE SKIN IN THE MORNING, 30 UNITS AT LUNCH AND 30 UNITS IN THE EVENING   levothyroxine 75 MCG tablet Commonly known as:  SYNTHROID, LEVOTHROID   liraglutide 18 MG/3ML Sopn Commonly known as:  Victoza INJECT 1.2MG  INTO THE SKIN DAILY   lisinopril 5 MG tablet Commonly known as:  PRINIVIL,ZESTRIL TAKE 1 TABLET BY MOUTH DAILY   metoprolol succinate 25 MG 24 hr tablet Commonly known as:  TOPROL-XL Take 1 tablet (25 mg total) by mouth daily.   NON FORMULARY Inhale 1 application into the lungs at bedtime. CPAP   potassium chloride SA 20 MEQ tablet Commonly known as:  K-DUR,KLOR-CON Take 20 mEq by mouth 2 (two) times daily.   rosuvastatin 20 MG tablet Commonly known as:  CRESTOR Take 20 mg by mouth daily.   tamsulosin 0.4 MG Caps capsule Commonly known as:  FLOMAX Take 0.4 mg by mouth daily.   torsemide 10 MG tablet Commonly known as:  DEMADEX Take 52m by mouth alternating with 1063m       Allergies:  Allergies  Allergen Reactions  . Actos [Pioglitazone] Swelling  . Metformin And Related Nausea Only  . Niaspan [Niacin Er] Itching and Rash     Past Medical History:  Diagnosis Date  . Aortic stenosis, mild 11/14/2013  .  Aortic valve sclerosis 03/29/2015  . Bilateral leg edema 05/21/2014  . CAD (coronary artery disease)   . Chronic diastolic congestive heart failure ([place])   . Chronic kidney disease (CKD), stage III (moderate) (HCC)   . Diabetes 1.5, managed as type 1 ([place]) 02/04/2013  . Diabetic peripheral neuropathy associated with type 1 diabetes mellitus ([place]) 11/14/2013  . Dysphagia   . Dyspnea on exertion 03/21/2015  . Exogenous obesity   . Gout   . Heart attack ([place])   . History of nuclear stress test 07/2011   dipyridamole; fixed inferolateral defect, worse at stress than rest; no reversible ischemia; low risk scan   . Hyperlipidemia   . Hypertension   . Insulin dependent diabetes mellitus ([place])   . Left foot drop   . Left main coronary artery disease 03/28/2015  . Memory loss   . Obesity (BMI 30-39.9) 11/14/2013  . Obstructive sleep apnea 03/21/2015  . OSA on CPAP    uses a cpap  . Peptic ulcer with hemorrhage 03/28/2015  . Peripheral neuropathy   . Rhabdomyolysis   . 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 Stroke ([place])    L patietal with small scattered lacunar infarcts  . Thrombocytopenia ([place]) 03/21/2015  . Venous insufficiency   . Weakness generalized 03/21/2015    Past Surgical History:  Procedure Laterality Date  . BACK SURGERY  2002   lumbosacral  . Carotid Doppler  03/2013   bilat bulb/prox ICAs - mild amount of fibrous plaque with no evidence of diameter reduction  . CARPAL TUNNEL RELEASE Bilateral 08/09/2014   Procedure: BILATERAL CARPAL TUNNEL RELEASE;  Surgeon: Daryll Brod, MD;  Location: [facility];  Service: Orthopedics;  Laterality: Bilateral;  ANESTHESIA:  IV REGIONAL BIL FAB  . CHOLECYSTECTOMY    . COLONOSCOPY    . CORONARY ANGIOPLASTY  10/13/1996  . CORONARY ANGIOPLASTY  09/21/1989   emergency PTCA  . CORONARY ANGIOPLASTY  10/13/1996    Multi-Link diagonal & OD stenting (Dr. Marella Chimes)  . CORONARY ANGIOPLASTY  12/03/1997   disease of mid DX-1 ~50% & in mid PLA & PDA (distal lesions) (Dr. Marella Chimes)   . CORONARY ANGIOPLASTY  10/14/1999   progression of disease distal PLA & PDA; progression of disease prox RCA - moderate (Dr. Marella Chimes)   . CORONARY ANGIOPLASTY WITH STENT PLACEMENT  04/04/2004   4.0x75m non-DES (thrombectomy via AngioJet) to RCA for high grade stenosis (Dr. RMarella Chimes  . CORONARY ARTERY BYPASS GRAFT N/A 03/29/2015   Procedure: CORONARY ARTERY BYPASS GRAFTING TIMES TWO USING LEFT INTERNAL MAMMARY ARTERY AND RIGHT LEG GREATER SAPHENOUS VEIN HARVESTED ENDOSCOPICALLY.;  Surgeon: CRexene Alberts MD;  [place] [facility]  Service: Open Heart Surgery;  Laterality: N/A;  . ESOPHAGOGASTRODUODENOSCOPY N/A 03/27/2015   Procedure: ESOPHAGOGASTRODUODENOSCOPY (EGD);  Surgeon: MClarene Essex MD;  [facility];  Service: Endoscopy;  Laterality: N/A;  possible dilation  . LEFT HEART CATHETERIZATION WITH CORONARY ANGIOGRAM N/A 03/28/2015   Procedure: LEFT HEART CATHETERIZATION WITH CORONARY ANGIOGRAM;  Surgeon: Peter M JMartinique MD;  [facility];  Service: Cardiovascular;  Laterality: N/A;  . ROTATOR CUFF REPAIR Right   . SINUS ENDO W/FUSION    . TONSILLECTOMY    . TRANSTHORACIC ECHOCARDIOGRAM  08/08/2013   EF 55-60%, mild conc hypertrophy, grade 1 diastolic dysfunction; AV with mild stenosis; LA & RA mildly dilated    Family History  Problem Relation Age of Onset  .  Heart disease Mother   . Coronary artery disease Father   . Cancer Maternal Grandmother   . Heart Problems Maternal Grandfather     Social History:  reports that he quit smoking about 46 years ago. His smoking use included cigarettes. He has a 40.00 pack-year smoking history. He has never used smokeless tobacco. He reports that he does not drink alcohol or use drugs.   Review of Systems   Lipid history: Last LDL  on 20 mg  rosuvastatin, followed by PCP Has history of CAD Last LDL was 47 in 3/20   Lab Results  Component Value Date   CHOL 96 02/15/2018   HDL 37.60 (L) 02/15/2018   LDLCALC 40 02/15/2018   TRIG 93.0 02/15/2018   CHOLHDL 3 02/15/2018           Hypertension: Mild and treated with the 5 mg lisinopril and metoprolol  Renal function: Mildly abnormal with last creatinine 1.44 again  Lab Results  Component Value Date   CREATININE 1.44 11/21/2018   CREATININE 1.27 08/22/2018   CREATININE 1.31 02/15/2018    Most recent eye exam was in 2/20, Dr. Gershon Crane and reportedly no retinopathy  Most recent foot exam: 7/41  Complications of diabetes: Peripheral neuropathy with sensory loss.  Dementia: Followed by neurologist and is on Aricept   LABS:  Abstract on 02/20/2019  Component Date Value Ref Range Status  . Microalb, Ur 02/13/2019 47.46   Final  . Hemoglobin A1C 02/13/2019 7.1   Final    Physical Examination:  BP 140/70 (BP Location: Left Arm, Patient Position: Sitting, Cuff Size: Normal)   Pulse 64   Ht '5\' 9"'  (1.753 m)   Wt 260 lb 3.2 oz (118 kg)   SpO2 96%   BMI 38.42 kg/m      ASSESSMENT:  Diabetes type 2, BMI 38+, insulin-dependent with obesity and history of neuropathy  See history of present illness for detailed discussion of current diabetes management, blood sugar patterns and problems identified  His A1c is 7.1  He is on basal bolus insulin regimen As discussed above his postprandial readings are likely higher after supper but based on his diet not consistent Also blood sugars may be relatively lower in the afternoon with a light meal at lunch periodically He is taking his Humalog sometime after eating and not before meals as directed Also does not understand how to adjust his mealtime dose based on what he is eating at breakfast and suppertime Still has difficulty losing weight consistently Fasting readings are probably fluctuating based on his readings the  night before  He is complaining about the cost of Tresiba  Lipids: Recently well controlled   PLAN:   He was reminded about needing to take Humalog consistently BEFORE every meal instead of after He will go up at least to 58 on the [place] On the next prescription can try Toujeo 60 units daily with the free co-pay card and call us if this appears to be less expensive than [place]  To check more blood sugars about 2 hours after supper Most likely needs to reduce his lunchtime dose to an average of 15 units but still take 20-25 for larger meals at lunch  Continue Victoza 1.2 mg for helping with control, weight maintenance and cardiovascular risk reduction   Patient Instructions  Take Humalog 15 at lunch not 20 Must take this just before meals  Toujeo: 60 units daily and 2-4 more if am sugar stays>150  Tresiba 58 units daily  Counseling time on subjects discussed in assessment and plan sections is over 50% of today's 25 minute visit   Elayne Snare 02/21/2019, 10:04 AM   Note: This office note was prepared with Dragon voice recognition system technology. Any transcriptional errors that result from this process are unintentional.

## 2019-03-20 ENCOUNTER — Other Ambulatory Visit: Payer: Self-pay | Admitting: Internal Medicine

## 2019-03-20 NOTE — Telephone Encounter (Signed)
Lisinopril refilled.

## 2019-03-25 DIAGNOSIS — E1165 Type 2 diabetes mellitus with hyperglycemia: Secondary | ICD-10-CM | POA: Diagnosis not present

## 2019-05-01 DIAGNOSIS — E1165 Type 2 diabetes mellitus with hyperglycemia: Secondary | ICD-10-CM | POA: Diagnosis not present

## 2019-05-22 ENCOUNTER — Other Ambulatory Visit: Payer: Self-pay | Admitting: Endocrinology

## 2019-05-23 ENCOUNTER — Other Ambulatory Visit (INDEPENDENT_AMBULATORY_CARE_PROVIDER_SITE_OTHER): Payer: Medicare Other

## 2019-05-23 ENCOUNTER — Other Ambulatory Visit: Payer: Self-pay

## 2019-05-23 DIAGNOSIS — R809 Proteinuria, unspecified: Secondary | ICD-10-CM | POA: Diagnosis not present

## 2019-05-23 DIAGNOSIS — E1129 Type 2 diabetes mellitus with other diabetic kidney complication: Secondary | ICD-10-CM | POA: Diagnosis not present

## 2019-05-23 DIAGNOSIS — Z794 Long term (current) use of insulin: Secondary | ICD-10-CM

## 2019-05-23 DIAGNOSIS — E1165 Type 2 diabetes mellitus with hyperglycemia: Secondary | ICD-10-CM

## 2019-05-23 DIAGNOSIS — E7800 Pure hypercholesterolemia, unspecified: Secondary | ICD-10-CM

## 2019-05-23 LAB — LIPID PANEL
Cholesterol: 98 mg/dL (ref 0–200)
HDL: 38.4 mg/dL — ABNORMAL LOW (ref >39.00)
LDL Cholesterol: 43 mg/dL (ref 0–99)
NonHDL: 59.72
Total CHOL/HDL Ratio: 3
Triglycerides: 83 mg/dL (ref 0.0–149.0)
VLDL: 16.6 mg/dL (ref 0.0–40.0)

## 2019-05-23 LAB — MICROALBUMIN / CREATININE URINE RATIO
Creatinine,U: 31.2 mg/dL
Microalb Creat Ratio: 70.2 mg/g — ABNORMAL HIGH (ref 0.0–30.0)
Microalb, Ur: 21.9 mg/dL — ABNORMAL HIGH (ref 0.0–1.9)

## 2019-05-23 LAB — COMPREHENSIVE METABOLIC PANEL
ALT: 9 U/L (ref 0–53)
AST: 14 U/L (ref 0–37)
Albumin: 3.6 g/dL (ref 3.5–5.2)
Alkaline Phosphatase: 72 U/L (ref 39–117)
BUN: 19 mg/dL (ref 6–23)
CO2: 31 mEq/L (ref 19–32)
Calcium: 8.8 mg/dL (ref 8.4–10.5)
Chloride: 104 mEq/L (ref 96–112)
Creatinine, Ser: 1.4 mg/dL (ref 0.40–1.50)
GFR: 48.97 mL/min — ABNORMAL LOW (ref >60.00)
Glucose, Bld: 130 mg/dL — ABNORMAL HIGH (ref 70–99)
Potassium: 4.4 mEq/L (ref 3.5–5.1)
Sodium: 142 mEq/L (ref 135–145)
Total Bilirubin: 0.7 mg/dL (ref 0.2–1.2)
Total Protein: 6.2 g/dL (ref 6.0–8.3)

## 2019-05-23 LAB — HEMOGLOBIN A1C: Hgb A1c MFr Bld: 7 % — ABNORMAL HIGH (ref 4.6–6.5)

## 2019-05-25 ENCOUNTER — Other Ambulatory Visit: Payer: Self-pay

## 2019-05-25 NOTE — Progress Notes (Signed)
Patient ID: Randall Wilkins, male   DOB: September 07, 1941, 78 y.o.   MRN: [id number]           Reason for Appointment: Follow-up for Type 2 Diabetes  Today's office visit was provided via telemedicine using a telephone call to the patient Patient has been explained the limitations of evaluation and management by telemedicine and the availability of in person appointments.  The patient understood the limitations and agreed to proceed. Patient also understood that the telehealth visit is billable. . Location of the patient: Home . Location of the provider: Office Only the patient and myself were participating in the encounter  History of Present Illness:          Date of diagnosis of type 2 diabetes mellitus:   1998       Background history:   Has been on insulin since about 2001 He thinks he was given metformin only for a month and may have stopped because of renal dysfunction Also Actos was tried and not clear what side effects he had Does not remember other medication he may have tried but has not tried Victoza or Byetta   Recent history:   INSULIN regimen is:   [place] 60 units at around 8 PM .  Humalog 15-20 units after breakfast and 20-25 after lunch and  25 at dinner     Non-insulin hypoglycemic drugs the patient is taking are: Victoza 1.2 mg daily  His A1c is 7%, previously 7.1 through his PCP   His previous range about 7.1-8.1 He is here for his 64-monthvisit  Current management, blood sugar patterns and problems identified:   Blood sugar readings have been checked fairly frequently and almost 4 times a day  He has done more readings after dinner  As before he checks his eating mostly before meals otherwise especially in the morning  His blood sugars are higher last month in the mornings but they are relatively better in the last several days  On an average blood sugars are still the highest before breakfast  He said that increase his Tresiba up to 60 units last  month when his blood sugars were high in the morning  He was told to try Toujeo on the last visit to help with the cost but he did not  More recently has been new cutting back on his overall mealtime insulin especially at lunchtime  Even with eating a sandwich his blood sugar before dinnertime is averaging only about 100  No significant HYPOGLYCEMIA and lowest reading was 66 on 05/03/2019  He is not able to do much physical activity  He thinks his weight is about 250, previously was 260 in the office   Compliance with the medical regimen: Fair  Glucose monitoring:  done about 3 + times a day         Glucometer:  contour      Blood Glucose readings by DOWNLOAD:    PRE-MEAL Fasting Lunch Dinner Bedtime Overall  Glucose range:  100-237  74-207  66-151    Mean/median:  163  133  102   130   POST-MEAL PC Breakfast PC Lunch PC Dinner  Glucose range:    75-170  Mean/median:    104    Previous readings  PRE-MEAL Fasting Lunch Dinner Bedtime Overall  Glucose range:  108-227  90-279  67-210  153, 239   Mean/median:  162  144  116  141   POST-MEAL PC Breakfast PC Lunch PC Dinner  Glucose range:    173  Mean/median:         Self-care:  Meal times are:  Breakfast is at 8 a.m., lunch: 12 noon Dinner: 6 PM   Typical meal intake: Breakfast is sometimes doughnuts or sometimes egg sandwich   lunch is a sandwich or cottage cheese/food, dinner is meat and vegetables/solid    Snacks: Sugar-free Jell-O or pudding, crackers          Dietician visit, most recent: 12/2017               CDE 8/18  Exercise:  unable to do any  Weight history: Range 252-280 previously  Wt Readings from Last 3 Encounters:  02/21/19 260 lb 3.2 oz (118 kg)  02/07/19 260 lb (117.9 kg)  01/26/19 266 lb (120.7 kg)    Glycemic control:   Lab Results  Component Value Date   HGBA1C 7.0 (H) 05/23/2019   HGBA1C 7.1 02/13/2019   HGBA1C 7.5 (H) 11/21/2018   Lab Results  Component Value Date    MICROALBUR 21.9 (H) 05/23/2019   LDLCALC 43 05/23/2019   CREATININE 1.40 05/23/2019   Lab Results  Component Value Date   MICRALBCREAT 70.2 (H) 05/23/2019    Lab Results  Component Value Date   FRUCTOSAMINE 252 12/17/2017   FRUCTOSAMINE 233 08/09/2017      Allergies as of 05/26/2019      Reactions   Actos [pioglitazone] Swelling   Metformin And Related Nausea Only   Niaspan [niacin Er] Itching, Rash      Medication List       Accurate as of May 25, 2019  9:02 PM. If you have any questions, ask your nurse or doctor.        allopurinol 300 MG tablet Commonly known as: ZYLOPRIM Take 1 tablet by mouth daily. Take 1 tab by my mouth daily   amitriptyline 50 MG tablet Commonly known as: ELAVIL Take 50 mg by mouth daily.   aspirin EC 81 MG tablet Take 1 tablet (81 mg total) by mouth daily.   colchicine 0.6 MG tablet Take 0.6 mg by mouth daily as needed.   Contour Next Monitor w/Device Kit 1 Device by Does not apply route 3 (three) times daily. Use to check blood sugars 3 times daily. Dx Code E13.9   donepezil 10 MG tablet Commonly known as: ARICEPT TAKE 1 TABLET BY MOUTH DAILY   glucose blood test strip Commonly known as: Contour Next Test USE TO CHECK GLUCOSE 4 TIMES DAILY. DX:E11.65   Insulin Degludec 200 UNIT/ML Sopn Commonly known as: [place] FlexTouch Inject 56 Units into the skin daily.   insulin lispro 100 UNIT/ML injection Commonly known as: HumaLOG INJECT 25 TO 30 UNITS UNDER THE SKIN IN THE MORNING, 30 UNITS AT LUNCH AND 30 UNITS IN THE EVENING   levothyroxine 75 MCG tablet Commonly known as: SYNTHROID   liraglutide 18 MG/3ML Sopn Commonly known as: Victoza INJECT 1.2MG  INTO THE SKIN DAILY   lisinopril 5 MG tablet Commonly known as: ZESTRIL TAKE 1 TABLET BY MOUTH DAILY   metoprolol succinate 25 MG 24 hr tablet Commonly known as: TOPROL-XL Take 1 tablet (25 mg total) by mouth daily.   NON FORMULARY Inhale 1 application into the lungs  at bedtime. CPAP   potassium chloride SA 20 MEQ tablet Commonly known as: K-DUR Take 20 mEq by mouth 2 (two) times daily.   rosuvastatin 20 MG tablet Commonly known as: CRESTOR Take 20 mg by mouth daily.  tamsulosin 0.4 MG Caps capsule Commonly known as: FLOMAX Take 0.4 mg by mouth daily.   torsemide 10 MG tablet Commonly known as: DEMADEX Take 50mg by mouth alternating with 100mg.       Allergies:  Allergies  Allergen Reactions  . Actos [Pioglitazone] Swelling  . Metformin And Related Nausea Only  . Niaspan [Niacin Er] Itching and Rash    Past Medical History:  Diagnosis Date  . Aortic stenosis, mild 11/14/2013  . Aortic valve sclerosis 03/29/2015  . Bilateral leg edema 05/21/2014  . CAD (coronary artery disease)   . Chronic diastolic congestive heart failure (HCC)   . Chronic kidney disease (CKD), stage III (moderate) (HCC)   . 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 Diabetic peripheral neuropathy associated with type 1 diabetes mellitus (HCC) 11/14/2013  . Dysphagia   . Dyspnea on exertion 03/21/2015  . Exogenous obesity   . Gout   . Heart attack (HCC)   . History of nuclear stress test 07/2011   dipyridamole; fixed inferolateral defect, worse at stress than rest; no reversible ischemia; low risk scan   . Hyperlipidemia   . Hypertension   . Insulin dependent diabetes mellitus (HCC)   . Left foot drop   . Left main coronary artery disease 03/28/2015  . Memory loss   . Obesity (BMI 30-39.9) 11/14/2013  . Obstructive sleep apnea 03/21/2015  . OSA on CPAP    uses a cpap  . Peptic ulcer with hemorrhage 03/28/2015  . Peripheral neuropathy   . Rhabdomyolysis   . 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 Stroke (HCC)    L patietal with small scattered lacunar infarcts  . Thrombocytopenia (HCC) 03/21/2015  . Venous insufficiency   . Weakness generalized 03/21/2015    Past Surgical History:  Procedure Laterality Date  . BACK  SURGERY  2002   lumbosacral  . Carotid Doppler  03/2013   bilat bulb/prox ICAs - mild amount of fibrous plaque with no evidence of diameter reduction  . CARPAL TUNNEL RELEASE Bilateral 08/09/2014   Procedure: BILATERAL CARPAL TUNNEL RELEASE;  Surgeon: Gary Kuzma, MD;  Location: [facility];  Service: Orthopedics;  Laterality: Bilateral;  ANESTHESIA:  IV REGIONAL BIL FAB  . CHOLECYSTECTOMY    . COLONOSCOPY    . CORONARY ANGIOPLASTY  10/13/1996  . CORONARY ANGIOPLASTY  09/21/1989   emergency PTCA  . CORONARY ANGIOPLASTY  10/13/1996   Multi-Link diagonal & OD stenting (Dr. R. Weintraub)  . CORONARY ANGIOPLASTY  12/03/1997   disease of mid DX-1 ~50% & in mid PLA & PDA (distal lesions) (Dr. R. Weintraub)   . CORONARY ANGIOPLASTY  10/14/1999   progression of disease distal PLA & PDA; progression of disease prox RCA - moderate (Dr. R. Weintraub)   . CORONARY ANGIOPLASTY WITH STENT PLACEMENT  04/04/2004   4.0x23mm non-DES (thrombectomy via AngioJet) to RCA for high grade stenosis (Dr. R. Weintraub)  . CORONARY ARTERY BYPASS GRAFT N/A 03/29/2015   Procedure: CORONARY ARTERY BYPASS GRAFTING TIMES TWO USING LEFT INTERNAL MAMMARY ARTERY AND RIGHT LEG GREATER SAPHENOUS VEIN HARVESTED ENDOSCOPICALLY.;  Surgeon: Clarence H Owen, MD;  Location: [facility] OR;  Service: Open Heart Surgery;  Laterality: N/A;  . ESOPHAGOGASTRODUODENOSCOPY N/A 03/27/2015   Procedure: ESOPHAGOGASTRODUODENOSCOPY (EGD);  Surgeon: Marc Magod, MD;  Location: [facility];  Service: Endoscopy;  Laterality: N/A;  possible dilation  . LEFT HEART CATHETERIZATION WITH CORONARY ANGIOGRAM N/A 03/28/2015     Procedure: LEFT HEART CATHETERIZATION WITH CORONARY ANGIOGRAM;  Surgeon: Peter M Martinique, MD;  Location: [facility] CATH LAB;  Service: Cardiovascular;  Laterality: N/A;  . ROTATOR CUFF REPAIR Right   . SINUS ENDO W/FUSION    . TONSILLECTOMY    . TRANSTHORACIC ECHOCARDIOGRAM  08/08/2013   EF 55-60%, mild conc hypertrophy, grade 1 diastolic  dysfunction; AV with mild stenosis; LA & RA mildly dilated    Family History  Problem Relation Age of Onset  . Heart disease Mother   . Coronary artery disease Father   . Cancer Maternal Grandmother   . Heart Problems Maternal Grandfather     Social History:  reports that he quit smoking about 46 years ago. His smoking use included cigarettes. He has a 40.00 pack-year smoking history. He has never used smokeless tobacco. He reports that he does not drink alcohol or use drugs.   Review of Systems   Lipid history: Last LDL  on 20 mg rosuvastatin, followed by PCP Has history of CAD Last LDL was 47 in 3/20   Lab Results  Component Value Date   CHOL 98 05/23/2019   HDL 38.40 (L) 05/23/2019   LDLCALC 43 05/23/2019   TRIG 83.0 05/23/2019   CHOLHDL 3 05/23/2019           Hypertension: Mild and treated with the 5 mg lisinopril and metoprolol He says his blood pressure is now about 130/60 His cardiologist had reduced his lisinopril when blood pressure was low with a 20 mg dose  BP Readings from Last 3 Encounters:  02/21/19 140/70  02/07/19 (!) 155/71  01/26/19 (!) 148/72     Renal function: Mildly abnormal as before:  Lab Results  Component Value Date   CREATININE 1.40 05/23/2019   CREATININE 1.44 11/21/2018   CREATININE 1.27 08/22/2018    Most recent eye exam was in 2/20, Dr. Gershon Crane and reportedly no retinopathy   Complications of diabetes: Peripheral neuropathy with sensory loss.  Dementia: Followed by neurologist and is on Aricept   LABS:  Lab on 05/23/2019  Component Date Value Ref Range Status  . Cholesterol 05/23/2019 98  0 - 200 mg/dL Final   ATP III Classification       Desirable:  < 200 mg/dL               Borderline High:  200 - 239 mg/dL          High:  > = 240 mg/dL  . Triglycerides 05/23/2019 83.0  0.0 - 149.0 mg/dL Final   Normal:  <150 mg/dLBorderline High:  150 - 199 mg/dL  . HDL 05/23/2019 38.40* >39.00 mg/dL Final  . VLDL 05/23/2019 16.6   0.0 - 40.0 mg/dL Final  . LDL Cholesterol 05/23/2019 43  0 - 99 mg/dL Final  . Total CHOL/HDL Ratio 05/23/2019 3   Final                  Men          Women1/2 Average Risk     3.4          3.3Average Risk          5.0          4.42X Average Risk          9.6          7.13X Average Risk          15.0          11.0                      .  NonHDL 05/23/2019 59.72   Final   NOTE:  Non-HDL goal should be 30 mg/dL higher than patient's LDL goal (i.e. LDL goal of < 70 mg/dL, would have non-HDL goal of < 100 mg/dL)  . Microalb, Ur 05/23/2019 21.9* 0.0 - 1.9 mg/dL Final  . Creatinine,U 05/23/2019 31.2  mg/dL Final  . Microalb Creat Ratio 05/23/2019 70.2* 0.0 - 30.0 mg/g Final  . Sodium 05/23/2019 142  135 - 145 mEq/L Final  . Potassium 05/23/2019 4.4  3.5 - 5.1 mEq/L Final  . Chloride 05/23/2019 104  96 - 112 mEq/L Final  . CO2 05/23/2019 31  19 - 32 mEq/L Final  . Glucose, Bld 05/23/2019 130* 70 - 99 mg/dL Final  . BUN 05/23/2019 19  6 - 23 mg/dL Final  . Creatinine, Ser 05/23/2019 1.40  0.40 - 1.50 mg/dL Final  . Total Bilirubin 05/23/2019 0.7  0.2 - 1.2 mg/dL Final  . Alkaline Phosphatase 05/23/2019 72  39 - 117 U/L Final  . AST 05/23/2019 14  0 - 37 U/L Final  . ALT 05/23/2019 9  0 - 53 U/L Final  . Total Protein 05/23/2019 6.2  6.0 - 8.3 g/dL Final  . Albumin 05/23/2019 3.6  3.5 - 5.2 g/dL Final  . Calcium 05/23/2019 8.8  8.4 - 10.5 mg/dL Final  . GFR 05/23/2019 48.97* >60.00 mL/min Final  . Hgb A1c MFr Bld 05/23/2019 7.0* 4.6 - 6.5 % Final   Glycemic Control Guidelines for People with Diabetes:Non Diabetic:  <6%Goal of Therapy: <7%Additional Action Suggested:  >8%     Physical Examination:  There were no vitals taken for this visit.     ASSESSMENT:  Diabetes type 2, BMI 38+, insulin-dependent with obesity and history of neuropathy  See history of present illness for detailed discussion of current diabetes management, blood sugar patterns and problems identified  His A1c is 7.0 and  stable  He is on basal bolus insulin regimen and Victoza  His blood sugars are generally well controlled but not consistent Not clear why his blood sugars were higher last month, likely to be from poor diet in the evenings and more snacks at night As before he is not able to exercise Although he thinks his weight is down to 250 not clear if this is accurate compared to the office weight machine  Recently blood sugars have been checked after dinner and they are not consistently high As before lowest blood sugars before dinner Discussed the need to balance insulin doses with what he is planning to eat especially at lunch and dinner All questions regarding his management, insulin, monitoring and diet were reviewed  Currently monitoring blood sugar over 3 times a day but will require 4 times a day monitoring to qualify for the freestyle libre  Lipids: Have been well controlled  HYPERTENSION with microalbuminuria: His microalbumin is persistently high   PLAN:   As before he was told that he needs to take his Humalog before starting to eat Continue 60 units of Tresiba but if his morning sugars start getting lower he can reduce it back He can probably cut down his lunchtime coverage to 8 units and take larger doses for eating more carbohydrate or larger meals Make sure he has protein with every meal  He will go up at least to 58 on the [place] On the next prescription can try Toujeo 60 units daily with the free co-pay card and call us if this appears to be less expensive than [place]  As  before encouraged him to check more readings after meals  For his hypertension will discuss with his cardiologist increasing the dose of his lisinopril to 5 mg twice daily and next prescription to be 10 mg  Continue Victoza 1.2 mg which he is tolerating, weight maintenance and cardiovascular risk reduction  The duration of telephone call for office visit today =11 minutes  There are no Patient  Instructions on file for this visit.         05/25/2019, 9:02 PM   Note: This office note was prepared with Dragon voice recognition system technology. Any transcriptional errors that result from this process are unintentional.  

## 2019-05-26 ENCOUNTER — Ambulatory Visit (INDEPENDENT_AMBULATORY_CARE_PROVIDER_SITE_OTHER): Payer: Medicare Other | Admitting: Endocrinology

## 2019-05-26 ENCOUNTER — Encounter: Payer: Self-pay | Admitting: Endocrinology

## 2019-05-26 ENCOUNTER — Other Ambulatory Visit: Payer: Self-pay

## 2019-05-26 DIAGNOSIS — E1165 Type 2 diabetes mellitus with hyperglycemia: Secondary | ICD-10-CM

## 2019-05-26 DIAGNOSIS — E1129 Type 2 diabetes mellitus with other diabetic kidney complication: Secondary | ICD-10-CM | POA: Diagnosis not present

## 2019-05-26 DIAGNOSIS — R809 Proteinuria, unspecified: Secondary | ICD-10-CM

## 2019-05-26 DIAGNOSIS — Z794 Long term (current) use of insulin: Secondary | ICD-10-CM

## 2019-05-26 MED ORDER — CONTOUR NEXT TEST VI STRP: ORAL_STRIP | 3 refills | Status: DC

## 2019-05-29 ENCOUNTER — Telehealth: Payer: Self-pay | Admitting: Endocrinology

## 2019-05-29 ENCOUNTER — Other Ambulatory Visit: Payer: Self-pay

## 2019-05-29 MED ORDER — PEN NEEDLES 33G X 4 MM MISC: 1.0000 | 3 refills | Status: DC

## 2019-05-29 NOTE — Telephone Encounter (Signed)
Rx sent 

## 2019-05-29 NOTE — Telephone Encounter (Signed)
MEDICATION: Pen Needles  PHARMACY: [facility] [place]   IS THIS A 90 DAY SUPPLY :   IS PATIENT OUT OF MEDICATION:   IF NOT; HOW MUCH IS LEFT: 6  LAST APPOINTMENT DATE: @6 /11/2019  NEXT APPOINTMENT DATE:@Visit  date not found  DO WE HAVE YOUR PERMISSION TO LEAVE A DETAILED MESSAGE:  OTHER COMMENTS:    **Let patient know to contact pharmacy at the end of the day to make sure medication is ready. **  ** Please notify patient to allow 48-72 hours to process**  **Encourage patient to contact the pharmacy for refills or they can request refills through [facility]**

## 2019-05-30 DIAGNOSIS — E1165 Type 2 diabetes mellitus with hyperglycemia: Secondary | ICD-10-CM | POA: Diagnosis not present

## 2019-05-31 ENCOUNTER — Other Ambulatory Visit: Payer: Self-pay

## 2019-05-31 MED ORDER — EASY COMFORT PEN NEEDLES 33G X 4 MM MISC: 1.0000 | 3 refills | Status: DC

## 2019-06-14 ENCOUNTER — Ambulatory Visit (INDEPENDENT_AMBULATORY_CARE_PROVIDER_SITE_OTHER): Payer: Medicare Other | Admitting: Orthopedic Surgery

## 2019-06-14 ENCOUNTER — Other Ambulatory Visit: Payer: Self-pay

## 2019-06-14 ENCOUNTER — Encounter: Payer: Self-pay | Admitting: Orthopedic Surgery

## 2019-06-14 DIAGNOSIS — M1711 Unilateral primary osteoarthritis, right knee: Secondary | ICD-10-CM

## 2019-06-14 MED ORDER — BUPIVACAINE HCL 0.25 % IJ SOLN
4.0000 mL | INTRAMUSCULAR | Status: AC | PRN
  Administered 2019-06-14: 4 mL via INTRA_ARTICULAR

## 2019-06-14 MED ORDER — LIDOCAINE HCL 1 % IJ SOLN
5.0000 mL | INTRAMUSCULAR | Status: AC | PRN
  Administered 2019-06-14: 5 mL

## 2019-06-14 MED ORDER — METHYLPREDNISOLONE ACETATE 40 MG/ML IJ SUSP
40.0000 mg | INTRAMUSCULAR | Status: AC | PRN
  Administered 2019-06-14: 40 mg via INTRA_ARTICULAR

## 2019-06-14 NOTE — Progress Notes (Signed)
Office Visit Note   Patient: Randall Wilkins           Date of Birth: [birth year]           MRN: [id number] Visit Date: 06/14/2019 Requested by: Randall Arabian, MD [address] [place],  [place] [address] PCP: Randall Arabian, MD  Subjective: Chief Complaint  Patient presents with  . Right Knee - Pain    HPI: Randall Wilkins is a patient with right knee and leg pain.  He had a fall 05/21/2019.  Knee gave way.  Reports some pain with activity and some grinding.  Has known history of significant arthritis.  Been ambulating with a walker.  Describes swelling.  He has left leg weakness from nerve injury.  Would like to have an injection today.  1 injections have given him in the past lasted 3 years.  He would like to have that repeated.              ROS: All systems reviewed are negative as they relate to the chief complaint within the history of present illness.  Patient denies  fevers or chills.   Assessment & Plan: Visit Diagnoses:  1. Unilateral primary osteoarthritis, right knee     Plan: Impression is right knee pain with exacerbation of arthritis from a fall.  Plan is aspiration and injection of the right knee today.  Risk benefits are discussed.  Hopefully lasting a long time.  He may need knee replacement in the future.  Follow-up as needed  Follow-Up Instructions: Return if symptoms worsen or fail to improve.   Orders:  No orders of the defined types were placed in this encounter.  No orders of the defined types were placed in this encounter.     Procedures: Large Joint Inj: R knee on 06/14/2019 5:26 PM Indications: diagnostic evaluation, joint swelling and pain Details: 18 G 1.5 in needle, superolateral approach  Arthrogram: No  Medications: 5 mL lidocaine 1 %; 40 mg methylPREDNISolone acetate 40 MG/ML; 4 mL bupivacaine 0.25 % Aspirate: 30 mL yellow Outcome: tolerated well, no immediate complications Procedure, treatment alternatives, risks and benefits  explained, specific risks discussed. Consent was given by the patient. Immediately prior to procedure a time out was called to verify the correct patient, procedure, equipment, support staff and site/side marked as required. Patient was prepped and draped in the usual sterile fashion.       Clinical Data: No additional findings.  Objective: Vital Signs: There were no vitals taken for this visit.  Physical Exam:   Constitutional: Patient appears well-developed HEENT:  Head: Normocephalic Eyes:EOM are normal Neck: Normal range of motion Cardiovascular: Normal rate Pulmonary/chest: Effort normal Neurologic: Patient is alert Skin: Skin is warm Psychiatric: Patient has normal mood and affect    Ortho Exam: Ortho exam demonstrates bilateral varus alignment.  Pedal pulses palpable.  No masses lymphadenopathy or skin changes noted in that right knee region.  He is got about a 10 degree flexion contracture.  Collaterals are stable.  Extensor mechanism is intact with palpable quad and hamstring tendon.  Trace effusion is present in the knee.  Specialty Comments:  No specialty comments available.  Imaging: No results found.   PMFS History: Patient Active Problem List   Diagnosis Date Noted  . Essential hypertension 03/23/2018  . Mild cognitive impairment 12/24/2017  . Gait abnormality 12/24/2017  . Murmur, cardiac 03/04/2017  . History of stroke 12/12/2015  . Gait disorder 12/12/2015  . Coronary artery disease  involving native coronary artery of native heart without angina pectoris   . S/P CABG x 2 03/29/2015  . Aortic valve sclerosis 03/29/2015  . Peptic ulcer with hemorrhage 03/28/2015  . Left main coronary artery disease 03/28/2015  . Chronic diastolic congestive heart failure ([place])   . Chronic kidney disease (CKD), stage III (moderate) (HCC)   . Dysphagia   . AKI (acute kidney injury) ([place])   . Acute respiratory failure with hypoxia ([place]) 03/25/2015  . NSTEMI (non-ST  elevated myocardial infarction) ([place]) 03/25/2015  . Shortness of breath   . UTI (lower urinary tract infection)   . Subendocardial MI subsequent episode care ([place]) 03/24/2015  . SOB (shortness of breath)   . Confusion 03/21/2015  . Weakness generalized 03/21/2015  . Increased urinary frequency 03/21/2015  . Hyperglycemia 03/21/2015  . Thrombocytopenia ([place]) 03/21/2015  . UTI (urinary tract infection) 03/21/2015  . Fall 03/21/2015  . Hematoma of abdominal wall 03/21/2015  . Diarrhea 03/21/2015  . Obstructive sleep apnea 03/21/2015  . Acute renal failure ([place]) 03/21/2015  . Mild diastolic dysfunction 17/51/0258  . Dyspnea on exertion 03/21/2015  . Neuropathy 09/11/2014  . Bilateral leg edema 05/21/2014  . Diabetic peripheral neuropathy associated with type 1 diabetes mellitus ([place]) 11/14/2013  . Type 1 diabetes mellitus with nephropathy ([place]) 11/14/2013  . Aortic valve stenosis 11/14/2013  . Obesity (BMI 30-39.9) 11/14/2013  . Asymptomatic PVCs 11/14/2013  . Memory impairment 02/04/2013  . Hyperlipidemia 02/04/2013  . Diabetes 1.5, managed as type 1 ([place]) 02/04/2013  . Cerebral infarction ([place]) 02/04/2013  . CAD S/P percutaneous coronary angioplasty 11/14/2004   Past Medical History:  Diagnosis Date  . Aortic stenosis, mild 11/14/2013  . Aortic valve sclerosis 03/29/2015  . Bilateral leg edema 05/21/2014  . CAD (coronary artery disease)   . Chronic diastolic congestive heart failure ([place])   . Chronic kidney disease (CKD), stage III (moderate) (HCC)   . Diabetes 1.5, managed as type 1 ([place]) 02/04/2013  . Diabetic peripheral neuropathy associated with type 1 diabetes mellitus ([place]) 11/14/2013  . Dysphagia   . Dyspnea on exertion 03/21/2015  . Exogenous obesity   . Gout   . Heart attack ([place])   . History of nuclear stress test 07/2011   dipyridamole; fixed inferolateral defect, worse at stress than rest; no reversible ischemia; low risk scan   . Hyperlipidemia   . Hypertension   .  Insulin dependent diabetes mellitus ([place])   . Left foot drop   . Left main coronary artery disease 03/28/2015  . Memory loss   . Obesity (BMI 30-39.9) 11/14/2013  . Obstructive sleep apnea 03/21/2015  . OSA on CPAP    uses a cpap  . Peptic ulcer with hemorrhage 03/28/2015  . Peripheral neuropathy   . Rhabdomyolysis   . 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 Stroke ([place])    L patietal with small scattered lacunar infarcts  . Thrombocytopenia ([place]) 03/21/2015  . Venous insufficiency   . Weakness generalized 03/21/2015    Family History  Problem Relation Age of Onset  . Heart disease Mother   . Coronary artery disease Father   . Cancer Maternal Grandmother   . Heart Problems Maternal Grandfather     Past Surgical History:  Procedure Laterality Date  . BACK SURGERY  2002   lumbosacral  . Carotid Doppler  03/2013   bilat bulb/prox ICAs - mild amount of fibrous plaque with no evidence  of diameter reduction  . CARPAL TUNNEL RELEASE Bilateral 08/09/2014   Procedure: BILATERAL CARPAL TUNNEL RELEASE;  Surgeon: Daryll Brod, MD;  Location: [facility];  Service: Orthopedics;  Laterality: Bilateral;  ANESTHESIA:  IV REGIONAL BIL FAB  . CHOLECYSTECTOMY    . COLONOSCOPY    . CORONARY ANGIOPLASTY  10/13/1996  . CORONARY ANGIOPLASTY  09/21/1989   emergency PTCA  . CORONARY ANGIOPLASTY  10/13/1996   Multi-Link diagonal & OD stenting (Dr. Marella Chimes)  . CORONARY ANGIOPLASTY  12/03/1997   disease of mid DX-1 ~50% & in mid PLA & PDA (distal lesions) (Dr. Marella Chimes)   . CORONARY ANGIOPLASTY  10/14/1999   progression of disease distal PLA & PDA; progression of disease prox RCA - moderate (Dr. Marella Chimes)   . CORONARY ANGIOPLASTY WITH STENT PLACEMENT  04/04/2004   4.0x53mm non-DES (thrombectomy via AngioJet) to RCA for high grade stenosis (Dr. Marella Chimes)  . CORONARY ARTERY BYPASS GRAFT N/A 03/29/2015   Procedure: CORONARY ARTERY BYPASS GRAFTING  TIMES TWO USING LEFT INTERNAL MAMMARY ARTERY AND RIGHT LEG GREATER SAPHENOUS VEIN HARVESTED ENDOSCOPICALLY.;  Surgeon: Rexene Alberts, MD;  Location: [facility];  Service: Open Heart Surgery;  Laterality: N/A;  . ESOPHAGOGASTRODUODENOSCOPY N/A 03/27/2015   Procedure: ESOPHAGOGASTRODUODENOSCOPY (EGD);  Surgeon: Clarene Essex, MD;  Location: [facility] ENDOSCOPY;  Service: Endoscopy;  Laterality: N/A;  possible dilation  . LEFT HEART CATHETERIZATION WITH CORONARY ANGIOGRAM N/A 03/28/2015   Procedure: LEFT HEART CATHETERIZATION WITH CORONARY ANGIOGRAM;  Surgeon: Peter M Martinique, MD;  Location: [facility] CATH LAB;  Service: Cardiovascular;  Laterality: N/A;  . ROTATOR CUFF REPAIR Right   . SINUS ENDO W/FUSION    . TONSILLECTOMY    . TRANSTHORACIC ECHOCARDIOGRAM  08/08/2013   EF 55-60%, mild conc hypertrophy, grade 1 diastolic dysfunction; AV with mild stenosis; LA & RA mildly dilated   Social History   Occupational History  . Not on file  Tobacco Use  . Smoking status: Former Smoker    Packs/day: 2.00    Years: 20.00    Pack years: 40.00    Types: Cigarettes    Quit date: 01/25/1973    Years since quitting: 46.4  . Smokeless tobacco: Never Used  Substance and Sexual Activity  . Alcohol use: No    Alcohol/week: 0.0 standard drinks  . Drug use: No  . Sexual activity: Not on file

## 2019-06-20 ENCOUNTER — Other Ambulatory Visit: Payer: Self-pay | Admitting: Endocrinology

## 2019-06-29 DIAGNOSIS — E1165 Type 2 diabetes mellitus with hyperglycemia: Secondary | ICD-10-CM | POA: Diagnosis not present

## 2019-07-09 ENCOUNTER — Other Ambulatory Visit: Payer: Self-pay | Admitting: Internal Medicine

## 2019-07-10 ENCOUNTER — Other Ambulatory Visit: Payer: Self-pay | Admitting: Neurology

## 2019-07-28 DIAGNOSIS — M150 Primary generalized (osteo)arthritis: Secondary | ICD-10-CM | POA: Diagnosis not present

## 2019-07-28 DIAGNOSIS — M5136 Other intervertebral disc degeneration, lumbar region: Secondary | ICD-10-CM | POA: Diagnosis not present

## 2019-07-28 DIAGNOSIS — E669 Obesity, unspecified: Secondary | ICD-10-CM | POA: Diagnosis not present

## 2019-07-28 DIAGNOSIS — M1A09X0 Idiopathic chronic gout, multiple sites, without tophus (tophi): Secondary | ICD-10-CM | POA: Diagnosis not present

## 2019-08-01 DIAGNOSIS — E1165 Type 2 diabetes mellitus with hyperglycemia: Secondary | ICD-10-CM | POA: Diagnosis not present

## 2019-08-09 ENCOUNTER — Other Ambulatory Visit: Payer: Self-pay | Admitting: Physician Assistant

## 2019-08-09 DIAGNOSIS — G8929 Other chronic pain: Secondary | ICD-10-CM

## 2019-08-09 DIAGNOSIS — M545 Low back pain, unspecified: Secondary | ICD-10-CM

## 2019-08-14 ENCOUNTER — Other Ambulatory Visit: Payer: Self-pay

## 2019-08-14 ENCOUNTER — Ambulatory Visit
Admission: RE | Admit: 2019-08-14 | Discharge: 2019-08-14 | Disposition: A | Discharge status: Home / Self Care | Payer: Medicare Other | Source: Ambulatory Visit | Attending: Physician Assistant | Admitting: Physician Assistant

## 2019-08-14 DIAGNOSIS — G8929 Other chronic pain: Secondary | ICD-10-CM

## 2019-08-14 DIAGNOSIS — M545 Low back pain, unspecified: Secondary | ICD-10-CM

## 2019-08-14 MED ORDER — IOPAMIDOL (ISOVUE-M 200) INJECTION 41%: 1.0000 mL | Freq: Once | INTRAMUSCULAR | Status: DC

## 2019-08-14 MED ORDER — METHYLPREDNISOLONE ACETATE 40 MG/ML INJ SUSP (RADIOLOG: 120.0000 mg | Freq: Once | INTRAMUSCULAR | Status: DC

## 2019-08-14 NOTE — Discharge Instructions (Signed)

## 2019-08-28 DIAGNOSIS — Z23 Encounter for immunization: Secondary | ICD-10-CM | POA: Diagnosis not present

## 2019-08-29 ENCOUNTER — Other Ambulatory Visit: Payer: Self-pay

## 2019-08-29 ENCOUNTER — Other Ambulatory Visit (INDEPENDENT_AMBULATORY_CARE_PROVIDER_SITE_OTHER): Payer: Medicare Other

## 2019-08-29 DIAGNOSIS — Z794 Long term (current) use of insulin: Secondary | ICD-10-CM

## 2019-08-29 DIAGNOSIS — R809 Proteinuria, unspecified: Secondary | ICD-10-CM | POA: Diagnosis not present

## 2019-08-29 DIAGNOSIS — E1129 Type 2 diabetes mellitus with other diabetic kidney complication: Secondary | ICD-10-CM

## 2019-08-29 DIAGNOSIS — E1165 Type 2 diabetes mellitus with hyperglycemia: Secondary | ICD-10-CM | POA: Diagnosis not present

## 2019-08-29 LAB — URINALYSIS, ROUTINE W REFLEX MICROSCOPIC
Bilirubin Urine: NEGATIVE
Ketones, ur: NEGATIVE
Leukocytes,Ua: NEGATIVE
Nitrite: NEGATIVE
Specific Gravity, Urine: 1.02 (ref 1.000–1.030)
Total Protein, Urine: 100 — AB
Urine Glucose: NEGATIVE
Urobilinogen, UA: 0.2 (ref 0.0–1.0)
pH: 6 (ref 5.0–8.0)

## 2019-08-29 LAB — HEMOGLOBIN A1C: Hgb A1c MFr Bld: 7 % — ABNORMAL HIGH (ref 4.6–6.5)

## 2019-08-29 LAB — BASIC METABOLIC PANEL
BUN: 19 mg/dL (ref 6–23)
CO2: 32 mEq/L (ref 19–32)
Calcium: 8.7 mg/dL (ref 8.4–10.5)
Chloride: 103 mEq/L (ref 96–112)
Creatinine, Ser: 1.47 mg/dL (ref 0.40–1.50)
GFR: 46.25 mL/min — ABNORMAL LOW (ref >60.00)
Glucose, Bld: 150 mg/dL — ABNORMAL HIGH (ref 70–99)
Potassium: 3.7 mEq/L (ref 3.5–5.1)
Sodium: 141 mEq/L (ref 135–145)

## 2019-08-29 LAB — MICROALBUMIN / CREATININE URINE RATIO
Creatinine,U: 96.1 mg/dL
Microalb Creat Ratio: 72.4 mg/g — ABNORMAL HIGH (ref 0.0–30.0)
Microalb, Ur: 69.6 mg/dL — ABNORMAL HIGH (ref 0.0–1.9)

## 2019-08-31 ENCOUNTER — Other Ambulatory Visit: Payer: Self-pay

## 2019-08-31 ENCOUNTER — Encounter: Payer: Self-pay | Admitting: Endocrinology

## 2019-08-31 ENCOUNTER — Ambulatory Visit: Payer: Medicare Other | Admitting: Endocrinology

## 2019-08-31 VITALS — BP 120/70 | HR 68 | Ht 69.0 in | Wt 267.0 lb

## 2019-08-31 DIAGNOSIS — E1142 Type 2 diabetes mellitus with diabetic polyneuropathy: Secondary | ICD-10-CM | POA: Diagnosis not present

## 2019-08-31 DIAGNOSIS — E1165 Type 2 diabetes mellitus with hyperglycemia: Secondary | ICD-10-CM | POA: Diagnosis not present

## 2019-08-31 DIAGNOSIS — Z794 Long term (current) use of insulin: Secondary | ICD-10-CM

## 2019-08-31 DIAGNOSIS — E1129 Type 2 diabetes mellitus with other diabetic kidney complication: Secondary | ICD-10-CM

## 2019-08-31 DIAGNOSIS — R809 Proteinuria, unspecified: Secondary | ICD-10-CM

## 2019-08-31 MED ORDER — METFORMIN HCL ER 750 MG PO TB24: ORAL_TABLET | ORAL | 2 refills | Status: DC

## 2019-08-31 MED ORDER — LISINOPRIL 10 MG PO TABS: 5.0000 mg | ORAL_TABLET | Freq: Every day | ORAL | 2 refills | Status: DC

## 2019-08-31 NOTE — Patient Instructions (Addendum)
If you are usually taking 15 units of Humalog before lunch reduce the dose to 12 units unless blood sugars are more than 150 before the meal  May increase or reduce the HUMALOG insulin at all meals by 5 units if eating a larger or smaller meals Continue taking insulin 10 to 15 minutes before the meal  METFORMIN ER: This will help keep the morning sugar better and if the morning sugars are starting to be below 100 reduce the dose of Tresiba by 4 units  However if morning sugars continue to stay over 150 increase the dose to 64  Check blood pressure once a week  If blood sugars are higher from getting steroid injection increase the Humalog by 15 to 20 units based on the blood sugar level

## 2019-08-31 NOTE — Progress Notes (Signed)
Patient ID: Randall Wilkins, male   DOB: [birth year], 78 y.o.   MRN: [id number]           Reason for Appointment: Follow-up for Type 2 Diabetes   History of Present Illness:          Date of diagnosis of type 2 diabetes mellitus:   1998       Background history:   Has been on insulin since about 2001 He thinks he was given metformin only for a month and may have stopped because of renal dysfunction Also Actos was tried and not clear what side effects he had Does not remember other medication he may have tried but has not tried Victoza or Byetta   Recent history:   INSULIN regimen is:   [place] 60 units at around 8 PM .  Humalog 15-20 units at breakfast and 15 after lunch and  25 at dinner     Non-insulin hypoglycemic drugs the patient is taking are: Victoza 1.2 mg daily  His A1c is 7% again  His previous range about 7.1-8.1 He is here for his 80-monthvisit  Current management, blood sugar patterns and problems identified:   Blood sugar readings were reviewed from his Contour meter download  He did have a couple of injections of steroids including on 8/31 which made his sugar go up to 394 but blood sugars came down the next day  However FASTING blood sugars appear to be mostly high and not explained by any snacks or change in compliance with his insulin  As before he is adjusting his insulin only based on what his pre-meal blood sugar is  With this his blood sugars still tend to be low normal at times at dinnertime  However he will sporadically have higher readings after supper although less frequently  He has done better with trying to check his sugars after supper  Also now trying to take his Humalog before starting to eat or after eating  Even with eating a sandwich his blood sugar before dinnertime is averaging only about 100  No significant HYPOGLYCEMIA with only rare blood sugars in the 60s, once after dinner  As before he is not able to do any exercise   Although his chart lists that he has had nausea from metformin he does not remember this    Glucose monitoring:  done about 3 + times a day         Glucometer:  contour      Blood Glucose readings by DOWNLOAD:    PRE-MEAL Fasting Lunch Dinner Bedtime Overall  Glucose range:  87-394  76-340  70-204    Mean/median:  160  125  115   137   POST-MEAL PC Breakfast PC Lunch  6 PM +  Glucose range:    63-229  Mean/median:    124   Previous readings:  PRE-MEAL Fasting Lunch Dinner Bedtime Overall  Glucose range:  100-237  74-207  66-151    Mean/median:  163  133  102   130   POST-MEAL PC Breakfast PC Lunch PC Dinner  Glucose range:    75-170  Mean/median:    104       Self-care:  Meal times are:  Breakfast is at 8 a.m., lunch: 12 noon Dinner: 5-6 PM   Typical meal intake: Breakfast is sometimes doughnuts or sometimes egg sandwich   lunch is a sandwich or cottage cheese/food, dinner is meat and vegetables/solid    Snacks: Sugar-free Jell-O or  pudding, crackers          Dietician visit, most recent: 12/2017               CDE 8/18  Exercise:  unable to do any  Weight history: Range 252-280 previously  Wt Readings from Last 3 Encounters:  08/31/19 267 lb (121.1 kg)  02/21/19 260 lb 3.2 oz (118 kg)  02/07/19 260 lb (117.9 kg)    Glycemic control:   Lab Results  Component Value Date   HGBA1C 7.0 (H) 08/29/2019   HGBA1C 7.0 (H) 05/23/2019   HGBA1C 7.1 02/13/2019   Lab Results  Component Value Date   MICROALBUR 69.6 (H) 08/29/2019   LDLCALC 43 05/23/2019   CREATININE 1.47 08/29/2019   Lab Results  Component Value Date   MICRALBCREAT 72.4 (H) 08/29/2019    Lab Results  Component Value Date   FRUCTOSAMINE 252 12/17/2017   FRUCTOSAMINE 233 08/09/2017      Allergies as of 08/31/2019      Reactions   Actos [pioglitazone] Swelling   Metformin And Related Nausea Only   Niaspan [niacin Er] Itching, Rash      Medication List       Accurate as of August 31, 2019  9:14 AM. If you have any questions, ask your nurse or doctor.        allopurinol 300 MG tablet Commonly known as: ZYLOPRIM Take 1 tablet by mouth daily. Take 1 tab by my mouth daily   amitriptyline 50 MG tablet Commonly known as: ELAVIL Take 50 mg by mouth daily.   aspirin EC 81 MG tablet Take 1 tablet (81 mg total) by mouth daily.   colchicine 0.6 MG tablet Take 0.6 mg by mouth daily as needed.   Contour Next Monitor w/Device Kit 1 Device by Does not apply route 3 (three) times daily. Use to check blood sugars 3 times daily. Dx Code E13.9   Contour Next Test test strip Generic drug: glucose blood USE TO CHECK GLUCOSE 4 TIMES DAILY. DX:E11.65   donepezil 10 MG tablet Commonly known as: ARICEPT TAKE ONE TABLET DAILY.  PLEASE CALL [phone] TO SCHEDULE FOLLOW UP.   Easy Comfort Pen Needles 33G X 4 MM Misc Generic drug: Insulin Pen Needle 1 each by Does not apply route See admin instructions. Use to inject insulin 5 times daily.   Insulin Degludec 200 UNIT/ML Sopn Commonly known as: [place] FlexTouch Inject 56 Units into the skin daily.   insulin lispro 100 UNIT/ML injection Commonly known as: HumaLOG INJECT 25 TO 30 UNITS UNDER THE SKIN IN THE MORNING, 30 UNITS AT LUNCH AND 30 UNITS IN THE EVENING   levothyroxine 75 MCG tablet Commonly known as: SYNTHROID   lisinopril 10 MG tablet Commonly known as: ZESTRIL Take 0.5 tablets (5 mg total) by mouth daily. What changed: medication strength Changed by: Jayme Cloud, LPN   metFORMIN 270 MG 24 hr tablet Commonly known as: GLUCOPHAGE-XR Take 1 tablet with dinner Started by: Jayme Cloud, LPN   metoprolol succinate 25 MG 24 hr tablet Commonly known as: TOPROL-XL Take 1 tablet (25 mg total) by mouth daily.   NON FORMULARY Inhale 1 application into the lungs at bedtime. CPAP   potassium chloride SA 20 MEQ tablet Commonly known as: K-DUR Take 20 mEq by mouth 2 (two) times daily.   rosuvastatin 20 MG  tablet Commonly known as: CRESTOR Take 20 mg by mouth daily.   tamsulosin 0.4 MG Caps capsule Commonly known as: FLOMAX  Take 0.4 mg by mouth daily.   torsemide 10 MG tablet Commonly known as: DEMADEX Take 74m by mouth alternating with 10235m   Victoza 18 MG/3ML Sopn Generic drug: liraglutide INJECT 1.2MG (0.35m46mINTO SKIN DAILY       Allergies:  Allergies  Allergen Reactions  . Actos [Pioglitazone] Swelling  . Metformin And Related Nausea Only  . Niaspan [Niacin Er] Itching and Rash    Past Medical History:  Diagnosis Date  . Aortic stenosis, mild 11/14/2013  . Aortic valve sclerosis 03/29/2015  . Bilateral leg edema 05/21/2014  . CAD (coronary artery disease)   . Chronic diastolic congestive heart failure ([place]CC[place] . Chronic kidney disease (CKD), stage III (moderate) (HCC)   . Diabetes 1.5, managed as type 1 ([place]/22/2014  . Diabetic peripheral neuropathy associated with type 1 diabetes mellitus ([place]CC[place]2/01/2013  . Dysphagia   . Dyspnea on exertion 03/21/2015  . Exogenous obesity   . Gout   . Heart attack ([place]CC[place] . History of nuclear stress test 07/2011   dipyridamole; fixed inferolateral defect, worse at stress than rest; no reversible ischemia; low risk scan   . Hyperlipidemia   . Hypertension   . Insulin dependent diabetes mellitus ([place]CC[place] . Left foot drop   . Left main coronary artery disease 03/28/2015  . Memory loss   . Obesity (BMI 30-39.9) 11/14/2013  . Obstructive sleep apnea 03/21/2015  . OSA on CPAP    uses a cpap  . Peptic ulcer with hemorrhage 03/28/2015  . Peripheral neuropathy   . Rhabdomyolysis   . 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 Stroke ([place]  L patietal with small scattered lacunar infarcts  . Thrombocytopenia (HCC[place]/06/2015  . Venous insufficiency   . Weakness generalized 03/21/2015    Past Surgical History:  Procedure Laterality Date  . BACK SURGERY  2002   lumbosacral  . Carotid Doppler  03/2013    bilat bulb/prox ICAs - mild amount of fibrous plaque with no evidence of diameter reduction  . CARPAL TUNNEL RELEASE Bilateral 08/09/2014   Procedure: BILATERAL CARPAL TUNNEL RELEASE;  Surgeon: GarDaryll BrodD;  [facility] [place] [place];  Laterality: Bilateral;  ANESTHESIA:  IV REGIONAL BIL FAB  . CHOLECYSTECTOMY    . COLONOSCOPY    . CORONARY ANGIOPLASTY  10/13/1996  . CORONARY ANGIOPLASTY  09/21/1989   emergency PTCA  . CORONARY ANGIOPLASTY  10/13/1996   Multi-Link diagonal & OD stenting (Dr. R. Marella Chimes. CORONARY ANGIOPLASTY  12/03/1997   disease of mid DX-1 ~50% & in mid PLA & PDA (distal lesions) (Dr. R. Marella Chimes . CORONARY ANGIOPLASTY  10/14/1999   progression of disease distal PLA & PDA; progression of disease prox RCA - moderate (Dr. R. Marella Chimes . CORONARY ANGIOPLASTY WITH STENT PLACEMENT  04/04/2004   [id number]-DES (thrombectomy via AngioJet) to RCA for high grade stenosis (Dr. R. WMarella Chimes CORONARY ARTERY BYPASS GRAFT N/A 03/29/2015   Procedure: CORONARY ARTERY BYPASS GRAFTING TIMES TWO USING LEFT INTERNAL MAMMARY ARTERY AND RIGHT LEG GREATER SAPHENOUS VEIN HARVESTED ENDOSCOPICALLY.;  Surgeon: ClarRexene Alberts;  [facility] [place] [facility];  Laterality: N/A;  . ESOPHAGOGASTRODUODENOSCOPY N/A 03/27/2015   Procedure: ESOPHAGOGASTRODUODENOSCOPY (EGD);  Surgeon: MarcClarene Essex;  [facility];  Service: Endoscopy;  Laterality: N/A;  possible dilation  . LEFT HEART CATHETERIZATION  WITH CORONARY ANGIOGRAM N/A 03/28/2015   Procedure: LEFT HEART CATHETERIZATION WITH CORONARY ANGIOGRAM;  Surgeon: Peter M Martinique, MD;  [facility];  Service: Cardiovascular;  Laterality: N/A;  . ROTATOR CUFF REPAIR Right   . SINUS ENDO W/FUSION    . TONSILLECTOMY    . TRANSTHORACIC ECHOCARDIOGRAM  08/08/2013   EF 55-60%, mild conc hypertrophy, grade 1 diastolic dysfunction; AV with mild stenosis; LA & RA mildly dilated     Family History  Problem Relation Age of Onset  . Heart disease Mother   . Coronary artery disease Father   . Cancer Maternal Grandmother   . Heart Problems Maternal Grandfather     Social History:  reports that he quit smoking about 46 years ago. His smoking use included cigarettes. He has a 40.00 pack-year smoking history. He has never used smokeless tobacco. He reports that he does not drink alcohol or use drugs.   Review of Systems   Lipid history: Last LDL  on 20 mg rosuvastatin, followed by PCP Has history of CAD    Lab Results  Component Value Date   CHOL 98 05/23/2019   HDL 38.40 (L) 05/23/2019   LDLCALC 43 05/23/2019   TRIG 83.0 05/23/2019   CHOLHDL 3 05/23/2019           Hypertension: Mild and treated with the 5 mg lisinopril which he takes twice a day now and metoprolol  His cardiologist had reduced his lisinopril when blood pressure was low with a 20 mg dose This was increased on his last visit He has not checked his blood pressure at home much Also blood pressure was high when he first came in  BP Readings from Last 3 Encounters:  08/31/19 120/70  08/14/19 (!) 198/66  02/21/19 140/70     Renal function: Mildly abnormal as before but stable increase in lisinopril dose:  Lab Results  Component Value Date   CREATININE 1.47 08/29/2019   CREATININE 1.40 05/23/2019   CREATININE 1.44 11/21/2018    Most recent eye exam was in 2/20, Dr. Gershon Crane and reportedly no retinopathy   Complications of diabetes: Peripheral neuropathy with sensory loss.  Dementia: Followed by neurologist and is on Aricept   LABS:  Lab on 08/29/2019  Component Date Value Ref Range Status  . Color, Urine 08/29/2019 YELLOW  Yellow;Lt. Yellow;Straw;Dark Yellow;Amber;Green;Red;Brown Final  . APPearance 08/29/2019 CLEAR  Clear;Turbid;Slightly Cloudy;Cloudy Final  . Specific Gravity, Urine 08/29/2019 1.020  1.000 - 1.030 Final  . pH 08/29/2019 6.0  5.0 - 8.0 Final  . Total  Protein, Urine 08/29/2019 100* Negative Final  . Urine Glucose 08/29/2019 NEGATIVE  Negative Final  . Ketones, ur 08/29/2019 NEGATIVE  Negative Final  . Bilirubin Urine 08/29/2019 NEGATIVE  Negative Final  . Hgb urine dipstick 08/29/2019 TRACE-INTACT* Negative Final  . Urobilinogen, UA 08/29/2019 0.2  0.0 - 1.0 Final  . Leukocytes,Ua 08/29/2019 NEGATIVE  Negative Final  . Nitrite 08/29/2019 NEGATIVE  Negative Final  . WBC, UA 08/29/2019 3-6/hpf* 0-2/hpf Final  . RBC / HPF 08/29/2019 0-2/hpf  0-2/hpf Final  . Squamous Epithelial / LPF 08/29/2019 Rare(0-4/hpf)  Rare(0-4/hpf) Final  . Hyaline Casts, UA 08/29/2019 Presence of* None Final  . Microalb, Ur 08/29/2019 69.6* 0.0 - 1.9 mg/dL Final  . Creatinine,U 08/29/2019 96.1  mg/dL Final  . Microalb Creat Ratio 08/29/2019 72.4* 0.0 - 30.0 mg/g Final  . Sodium 08/29/2019 141  135 - 145 mEq/L Final  . Potassium 08/29/2019 3.7  3.5 - 5.1 mEq/L Final  .  Chloride 08/29/2019 103  96 - 112 mEq/L Final  . CO2 08/29/2019 32  19 - 32 mEq/L Final  . Glucose, Bld 08/29/2019 150* 70 - 99 mg/dL Final  . BUN 08/29/2019 19  6 - 23 mg/dL Final  . Creatinine, Ser 08/29/2019 1.47  0.40 - 1.50 mg/dL Final  . Calcium 08/29/2019 8.7  8.4 - 10.5 mg/dL Final  . GFR 08/29/2019 46.25* >60.00 mL/min Final  . Hgb A1c MFr Bld 08/29/2019 7.0* 4.6 - 6.5 % Final   Glycemic Control Guidelines for People with Diabetes:Non Diabetic:  <6%Goal of Therapy: <7%Additional Action Suggested:  >8%     Physical Examination:  BP 120/70 (Cuff Size: Large)   Pulse 68   Ht '5\' 9"'$  (1.753 m)   Wt 267 lb (121.1 kg)   SpO2 96%   BMI 39.43 kg/m      ASSESSMENT:  Diabetes type 2, BMI 38+, insulin-dependent with obesity and history of neuropathy  See history of present illness for detailed discussion of current diabetes management, blood sugar patterns and problems identified  His A1c is 7.0 and stable  He is on basal bolus insulin regimen and Victoza  His blood sugars are  variable especially in the morning Again discussed adjustment of insulin, managing mealtime insulin based on type of meal he is planning to eat Not just pre-meal blood sugar readings Discussed benefits of metformin and Victoza in conjunction with insulin  Currently monitoring blood sugar over 3 times a day but will require 4 times a day monitoring to qualify for the freestyle libre  Lipids: Have been well controlled  HYPERTENSION with microalbuminuria: His microalbumin is still high despite increasing lisinopril to 10 mg However lisinopril dose is limited by his blood pressure Recent renal function stable   PLAN:   Check blood sugars malnutrition at various times  Continue 60 units of Tresiba but if his morning sugars are consistently below 100 he can reduce it down to 56 Alternatively if blood sugars continue to stay over 150 will go up to 64 Given written instructions on adjusting Humalog based on meals, if he has another steroid injection To take it before eating consistently  Trial of metformin ER 750 mg which will hopefully reduce his insulin dosage, improved fasting blood sugars consistently and work in conjunction with Victoza He will need to continue Victoza for cardiovascular benefits and hopefully will start losing weight also  As before encouraged him to check more readings after meals  For his hypertension he will continue 10 mg of lisinopril current blood pressure and renal function is stable Also no further increase in his urine microalbumin New prescription sent Counseling time on subjects discussed in assessment and plan sections is over 50% of today's 25 minute visit     Patient Instructions  If you are usually taking 15 units of Humalog before lunch reduce the dose to 12 units unless blood sugars are more than 150 before the meal  May increase or reduce the HUMALOG insulin at all meals by 5 units if eating a larger or smaller meals Continue taking insulin 10  to 15 minutes before the meal  METFORMIN ER: This will help keep the morning sugar better and if the morning sugars are starting to be below 100 reduce the dose of Tresiba by 4 units  However if morning sugars continue to stay over 150 increase the dose to 64  Check blood pressure once a week  If blood sugars are higher from getting steroid injection increase  the Humalog by 15 to 20 units based on the blood sugar level        Elayne Snare 08/31/2019, 9:14 AM   Note: This office note was prepared with Dragon voice recognition system technology. Any transcriptional errors that result from this process are unintentional.

## 2019-09-04 ENCOUNTER — Telehealth: Payer: Self-pay | Admitting: Endocrinology

## 2019-09-04 NOTE — Telephone Encounter (Signed)
error 

## 2019-09-05 DIAGNOSIS — E1165 Type 2 diabetes mellitus with hyperglycemia: Secondary | ICD-10-CM | POA: Diagnosis not present

## 2019-09-15 ENCOUNTER — Other Ambulatory Visit: Payer: Self-pay | Admitting: Endocrinology

## 2019-10-16 ENCOUNTER — Other Ambulatory Visit: Payer: Self-pay | Admitting: Neurology

## 2019-10-17 DIAGNOSIS — E1165 Type 2 diabetes mellitus with hyperglycemia: Secondary | ICD-10-CM | POA: Diagnosis not present

## 2019-11-02 DIAGNOSIS — R05 Cough: Secondary | ICD-10-CM | POA: Diagnosis not present

## 2019-11-02 DIAGNOSIS — R110 Nausea: Secondary | ICD-10-CM | POA: Diagnosis not present

## 2019-11-03 DIAGNOSIS — R05 Cough: Secondary | ICD-10-CM | POA: Diagnosis not present

## 2019-11-13 ENCOUNTER — Other Ambulatory Visit: Payer: Medicare Other

## 2019-11-14 ENCOUNTER — Other Ambulatory Visit: Payer: Self-pay

## 2019-11-14 ENCOUNTER — Other Ambulatory Visit (INDEPENDENT_AMBULATORY_CARE_PROVIDER_SITE_OTHER): Payer: Medicare Other

## 2019-11-14 DIAGNOSIS — Z794 Long term (current) use of insulin: Secondary | ICD-10-CM | POA: Diagnosis not present

## 2019-11-14 DIAGNOSIS — E1165 Type 2 diabetes mellitus with hyperglycemia: Secondary | ICD-10-CM | POA: Diagnosis not present

## 2019-11-14 LAB — COMPREHENSIVE METABOLIC PANEL
ALT: 10 U/L (ref 0–53)
AST: 13 U/L (ref 0–37)
Albumin: 3.5 g/dL (ref 3.5–5.2)
Alkaline Phosphatase: 73 U/L (ref 39–117)
BUN: 20 mg/dL (ref 6–23)
CO2: 31 mEq/L (ref 19–32)
Calcium: 9.2 mg/dL (ref 8.4–10.5)
Chloride: 103 mEq/L (ref 96–112)
Creatinine, Ser: 1.31 mg/dL (ref 0.40–1.50)
GFR: 52.8 mL/min — ABNORMAL LOW (ref >60.00)
Glucose, Bld: 126 mg/dL — ABNORMAL HIGH (ref 70–99)
Potassium: 3.9 mEq/L (ref 3.5–5.1)
Sodium: 141 mEq/L (ref 135–145)
Total Bilirubin: 0.6 mg/dL (ref 0.2–1.2)
Total Protein: 5.9 g/dL — ABNORMAL LOW (ref 6.0–8.3)

## 2019-11-14 LAB — HEMOGLOBIN A1C: Hgb A1c MFr Bld: 6.8 % — ABNORMAL HIGH (ref 4.6–6.5)

## 2019-11-15 ENCOUNTER — Other Ambulatory Visit: Payer: Self-pay | Admitting: Endocrinology

## 2019-11-15 NOTE — Progress Notes (Signed)
Patient ID: Randall Wilkins, male   DOB: 09/08/1941, 77 y.o.   MRN: [id number]           Reason for Appointment: Follow-up for Type 2 Diabetes   History of Present Illness:          Date of diagnosis of type 2 diabetes mellitus:   1998       Background history:   Has been on insulin since about 2001 He thinks he was given metformin only for a month and may have stopped because of renal dysfunction Also Actos was tried and not clear what side effects he had Does not remember other medication he may have tried but has not tried Victoza or Byetta   Recent history:    INSULIN regimen is:   [place] 60 units at around 8 PM .  Humalog 15-20 units at breakfast and 15 after lunch and  20 at dinner     Non-insulin hypoglycemic drugs the patient is taking are: Victoza 1.2 mg daily, Metformin 750 mg daily  His A1c is 6.8 compared to 7% His previous range about 7.1-8.1  He is here for his 74-monthvisit  Current management, blood sugar patterns and problems identified:   Blood sugar readings were reviewed from his Contour meter download.  He agreed to start Metformin on his visit in 9/20 and has had no nausea from this, previously also may been told to stop this when he had some renal dysfunction  His blood sugars are overall fluctuating much less and A1c is the best he has had  Also no hypoglycemia  He is not checking sugars enough after meals and at night and difficult to adjust his mealtime  However he says he is taking only 20 units with the NovoLog insulin instead of 25 but mostly basing it on the premeal blood sugar  Only occasionally will have a random reading about 190+, highest reading 271 on Thanksgiving day  Otherwise has lost 3 pounds since last visit  Previously had blood sugars sporadically over 300 also    Glucose monitoring:  done about 3 + times a day         Glucometer:  contour      Blood Glucose readings by DOWNLOAD for the last 2 weeks:    PRE-MEAL  Fasting Lunch Dinner Bedtime Overall  Glucose range:  86-148  79-178  88-133    Mean/median:  121  133  109   134   POST-MEAL PC Breakfast PC Lunch PC Dinner  Glucose range:  137, 195   115, 271  Mean/median:       Previous readings: PRE-MEAL Fasting Lunch Dinner Bedtime Overall  Glucose range:  87-394  76-340  70-204    Mean/median:  160  125  115   137   POST-MEAL PC Breakfast PC Lunch  6 PM +  Glucose range:    63-229  Mean/median:    124        Self-care:  Meal times are:  Breakfast is at 8 a.m., lunch: 12 noon Dinner: 5-6 PM   Typical meal intake: Breakfast is sometimes doughnuts or sometimes egg sandwich   lunch is a sandwich or cottage cheese/food, dinner is meat and vegetables/solid    Snacks: Sugar-free Jell-O or pudding, crackers          Dietician visit, most recent: 12/2017               CDE 8/18  Exercise:  unable to do any  Weight history: Range 252-280 previously  Wt Readings from Last 3 Encounters:  11/16/19 264 lb 3.2 oz (119.8 kg)  08/31/19 267 lb (121.1 kg)  02/21/19 260 lb 3.2 oz (118 kg)    Glycemic control:   Lab Results  Component Value Date   HGBA1C 6.8 (H) 11/14/2019   HGBA1C 7.0 (H) 08/29/2019   HGBA1C 7.0 (H) 05/23/2019   Lab Results  Component Value Date   MICROALBUR 69.6 (H) 08/29/2019   LDLCALC 43 05/23/2019   CREATININE 1.31 11/14/2019   Lab Results  Component Value Date   MICRALBCREAT 72.4 (H) 08/29/2019    Lab Results  Component Value Date   FRUCTOSAMINE 252 12/17/2017   FRUCTOSAMINE 233 08/09/2017      Allergies as of 11/16/2019      Reactions   Actos [pioglitazone] Swelling   Metformin And Related Nausea Only   Niaspan [niacin Er] Itching, Rash      Medication List       Accurate as of November 16, 2019  8:23 AM. If you have any questions, ask your nurse or doctor.        allopurinol 300 MG tablet Commonly known as: ZYLOPRIM Take 1 tablet by mouth daily. Take 1 tab by my mouth daily    amitriptyline 50 MG tablet Commonly known as: ELAVIL Take 50 mg by mouth daily.   aspirin EC 81 MG tablet Take 1 tablet (81 mg total) by mouth daily.   colchicine 0.6 MG tablet Take 0.6 mg by mouth daily as needed.   Contour Next Monitor w/Device Kit 1 Device by Does not apply route 3 (three) times daily. Use to check blood sugars 3 times daily. Dx Code E13.9   Contour Next Test test strip Generic drug: glucose blood USE TO CHECK GLUCOSE 4 TIMES DAILY   donepezil 10 MG tablet Commonly known as: ARICEPT TAKE ONE TABLET DAILY.  PLEASE CALL [phone] TO SCHEDULE FOLLOW UP.   Easy Comfort Pen Needles 33G X 4 MM Misc Generic drug: Insulin Pen Needle 1 each by Does not apply route See admin instructions. Use to inject insulin 5 times daily.   insulin lispro 100 UNIT/ML injection Commonly known as: HumaLOG INJECT 25 TO 30 UNITS UNDER THE SKIN IN THE MORNING, 30 UNITS AT LUNCH AND 30 UNITS IN THE EVENING What changed: additional instructions   levothyroxine 75 MCG tablet Commonly known as: SYNTHROID   lisinopril 10 MG tablet Commonly known as: ZESTRIL Take 0.5 tablets (5 mg total) by mouth daily.   metFORMIN 750 MG 24 hr tablet Commonly known as: GLUCOPHAGE-XR Take 1 tablet with dinner   metoprolol succinate 25 MG 24 hr tablet Commonly known as: TOPROL-XL Take 1 tablet (25 mg total) by mouth daily.   NON FORMULARY Inhale 1 application into the lungs at bedtime. CPAP   potassium chloride SA 20 MEQ tablet Commonly known as: KLOR-CON Take 20 mEq by mouth 2 (two) times daily.   rosuvastatin 20 MG tablet Commonly known as: CRESTOR Take 20 mg by mouth daily.   tamsulosin 0.4 MG Caps capsule Commonly known as: FLOMAX Take 0.4 mg by mouth daily.   torsemide 10 MG tablet Commonly known as: DEMADEX Take 470m by mouth alternating with 1065m   TrTyler AaslexTouch 200 UNIT/ML Sopn Generic drug: Insulin Degludec INJECT 56 UNITS INTO SKIN DAILY What changed: See the new  instructions.   Victoza 18 MG/3ML Sopn Generic drug: liraglutide INJECT 1.2MG (0.70m60mINTO SKIN DAILY       Allergies:  Allergies  Allergen Reactions  . Actos [Pioglitazone] Swelling  . Metformin And Related Nausea Only  . Niaspan [Niacin Er] Itching and Rash    Past Medical History:  Diagnosis Date  . Aortic stenosis, mild 11/14/2013  . Aortic valve sclerosis 03/29/2015  . Bilateral leg edema 05/21/2014  . CAD (coronary artery disease)   . Chronic diastolic congestive heart failure ([place])   . Chronic kidney disease (CKD), stage III (moderate)   . Diabetes 1.5, managed as type 1 ([place]) 02/04/2013  . Diabetic peripheral neuropathy associated with type 1 diabetes mellitus ([place]) 11/14/2013  . Dysphagia   . Dyspnea on exertion 03/21/2015  . Exogenous obesity   . Gout   . Heart attack ([place])   . History of nuclear stress test 07/2011   dipyridamole; fixed inferolateral defect, worse at stress than rest; no reversible ischemia; low risk scan   . Hyperlipidemia   . Hypertension   . Insulin dependent diabetes mellitus   . Left foot drop   . Left main coronary artery disease 03/28/2015  . Memory loss   . Obesity (BMI 30-39.9) 11/14/2013  . Obstructive sleep apnea 03/21/2015  . OSA on CPAP    uses a cpap  . Peptic ulcer with hemorrhage 03/28/2015  . Peripheral neuropathy   . Rhabdomyolysis   . 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 Stroke ([place])    L patietal with small scattered lacunar infarcts  . Thrombocytopenia ([place]) 03/21/2015  . Venous insufficiency   . Weakness generalized 03/21/2015    Past Surgical History:  Procedure Laterality Date  . BACK SURGERY  2002   lumbosacral  . Carotid Doppler  03/2013   bilat bulb/prox ICAs - mild amount of fibrous plaque with no evidence of diameter reduction  . CARPAL TUNNEL RELEASE Bilateral 08/09/2014   Procedure: BILATERAL CARPAL TUNNEL RELEASE;  Surgeon: Daryll Brod, MD;  Location: [facility];  Service: Orthopedics;  Laterality: Bilateral;  ANESTHESIA:  IV REGIONAL BIL FAB  . CHOLECYSTECTOMY    . COLONOSCOPY    . CORONARY ANGIOPLASTY  10/13/1996  . CORONARY ANGIOPLASTY  09/21/1989   emergency PTCA  . CORONARY ANGIOPLASTY  10/13/1996   Multi-Link diagonal & OD stenting (Dr. Marella Chimes)  . CORONARY ANGIOPLASTY  12/03/1997   disease of mid DX-1 ~50% & in mid PLA & PDA (distal lesions) (Dr. Marella Chimes)   . CORONARY ANGIOPLASTY  10/14/1999   progression of disease distal PLA & PDA; progression of disease prox RCA - moderate (Dr. Marella Chimes)   . CORONARY ANGIOPLASTY WITH STENT PLACEMENT  04/04/2004   4.0x58m non-DES (thrombectomy via AngioJet) to RCA for high grade stenosis (Dr. RMarella Chimes  . CORONARY ARTERY BYPASS GRAFT N/A 03/29/2015   Procedure: CORONARY ARTERY BYPASS GRAFTING TIMES TWO USING LEFT INTERNAL MAMMARY ARTERY AND RIGHT LEG GREATER SAPHENOUS VEIN HARVESTED ENDOSCOPICALLY.;  Surgeon: CRexene Alberts MD;  [facility]  Service: Open Heart Surgery;  Laterality: N/A;  . ESOPHAGOGASTRODUODENOSCOPY N/A 03/27/2015   Procedure: ESOPHAGOGASTRODUODENOSCOPY (EGD);  Surgeon: MClarene Essex MD;  [facility];  Service: Endoscopy;  Laterality: N/A;  possible dilation  . LEFT HEART CATHETERIZATION WITH CORONARY ANGIOGRAM N/A 03/28/2015   Procedure: LEFT HEART CATHETERIZATION WITH CORONARY ANGIOGRAM;  Surgeon: Peter M JMartinique MD;  [facility];  Service: Cardiovascular;  Laterality: N/A;  . ROTATOR CUFF REPAIR Right   . SINUS ENDO W/FUSION    . TONSILLECTOMY    .  TRANSTHORACIC ECHOCARDIOGRAM  08/08/2013   EF 55-60%, mild conc hypertrophy, grade 1 diastolic dysfunction; AV with mild stenosis; LA & RA mildly dilated    Family History  Problem Relation Age of Onset  . Heart disease Mother   . Coronary artery disease Father   . Cancer Maternal Grandmother   . Heart Problems Maternal Grandfather     Social History:  reports that he quit smoking about  46 years ago. His smoking use included cigarettes. He has a 40.00 pack-year smoking history. He has never used smokeless tobacco. He reports that he does not drink alcohol or use drugs.   Review of Systems   Lipid history: Last LDL  on 20 mg rosuvastatin, followed by PCP Has history of CAD    Lab Results  Component Value Date   CHOL 98 05/23/2019   HDL 38.40 (L) 05/23/2019   LDLCALC 43 05/23/2019   TRIG 83.0 05/23/2019   CHOLHDL 3 05/23/2019           Hypertension: Mild and treated with the 5 mg lisinopril which he takes twice a day now and metoprolol  His cardiologist had reduced his lisinopril when blood pressure was low with a 20 mg dose He is now taking 5 mg twice daily  He has not checked his blood pressure at home Occasionally may have mild whitecoat syndrome  BP Readings from Last 3 Encounters:  11/16/19 (!) 142/72  08/31/19 120/70  08/14/19 (!) 198/66     Renal function: Mildly abnormal with some fluctuation  Lab Results  Component Value Date   CREATININE 1.31 11/14/2019   CREATININE 1.47 08/29/2019   CREATININE 1.40 05/23/2019    Most recent eye exam was in 2/20, Dr. Gershon Crane and reportedly no retinopathy   Complications of diabetes: Peripheral neuropathy with sensory loss.  Dementia: Followed by neurologist and is on Aricept   LABS:  Lab on 11/14/2019  Component Date Value Ref Range Status  . Sodium 11/14/2019 141  135 - 145 mEq/L Final  . Potassium 11/14/2019 3.9  3.5 - 5.1 mEq/L Final  . Chloride 11/14/2019 103  96 - 112 mEq/L Final  . CO2 11/14/2019 31  19 - 32 mEq/L Final  . Glucose, Bld 11/14/2019 126* 70 - 99 mg/dL Final  . BUN 11/14/2019 20  6 - 23 mg/dL Final  . Creatinine, Ser 11/14/2019 1.31  0.40 - 1.50 mg/dL Final  . Total Bilirubin 11/14/2019 0.6  0.2 - 1.2 mg/dL Final  . Alkaline Phosphatase 11/14/2019 73  39 - 117 U/L Final  . AST 11/14/2019 13  0 - 37 U/L Final  . ALT 11/14/2019 10  0 - 53 U/L Final  . Total Protein 11/14/2019  5.9* 6.0 - 8.3 g/dL Final  . Albumin 11/14/2019 3.5  3.5 - 5.2 g/dL Final  . GFR 11/14/2019 52.80* >60.00 mL/min Final  . Calcium 11/14/2019 9.2  8.4 - 10.5 mg/dL Final  . Hgb A1c MFr Bld 11/14/2019 6.8* 4.6 - 6.5 % Final   Glycemic Control Guidelines for People with Diabetes:Non Diabetic:  <6%Goal of Therapy: <7%Additional Action Suggested:  >8%     Physical Examination:  BP (!) 142/72 (BP Location: Left Arm, Patient Position: Sitting, Cuff Size: Large)   Pulse 69   Ht '5\' 9"'  (1.753 m)   Wt 264 lb 3.2 oz (119.8 kg)   SpO2 92%   BMI 39.02 kg/m   No ankle edema present    ASSESSMENT:  Diabetes type 2, BMI 38+, insulin-dependent with obesity and  history of neuropathy  See history of present illness for detailed discussion of current diabetes management, blood sugar patterns and problems identified  His A1c is slightly better at 6.8  He is on basal bolus insulin regimen, Metformin and Victoza  Even with adding only 750 mg Metformin his blood sugars are overall better and he has much less tendency to postprandial and overnight hyperglycemia Also has lost 3 pounds Again checking blood sugars mostly blood sugar before meals and still does not understand the need to check some readings after dinner regularly He seems to do fairly well with current dose of 60 units basal insulin and 15 to 20 units of mealtime insulin   HYPERTENSION with microalbuminuria: We will check microalbumin on the next visit again  We will continue 10 mg lisinopril but take this once a day  PLAN:   Check blood sugars more often after dinner  Continue 60 units of Tresiba, again discussed that if he has low normal readings in the mornings consistently he will need to reduce the dose Continue Metformin Follow-up in 3 months    Patient Instructions  More sugars after supper        Elayne Snare 11/16/2019, 8:23 AM   Note: This office note was prepared with Dragon voice recognition system technology.  Any transcriptional errors that result from this process are unintentional.

## 2019-11-16 ENCOUNTER — Other Ambulatory Visit: Payer: Self-pay

## 2019-11-16 ENCOUNTER — Encounter: Payer: Self-pay | Admitting: Endocrinology

## 2019-11-16 ENCOUNTER — Ambulatory Visit (INDEPENDENT_AMBULATORY_CARE_PROVIDER_SITE_OTHER): Payer: Medicare Other | Admitting: Endocrinology

## 2019-11-16 VITALS — BP 142/72 | HR 69 | Ht 69.0 in | Wt 264.2 lb

## 2019-11-16 DIAGNOSIS — Z794 Long term (current) use of insulin: Secondary | ICD-10-CM | POA: Diagnosis not present

## 2019-11-16 DIAGNOSIS — E1142 Type 2 diabetes mellitus with diabetic polyneuropathy: Secondary | ICD-10-CM | POA: Diagnosis not present

## 2019-11-16 DIAGNOSIS — E1165 Type 2 diabetes mellitus with hyperglycemia: Secondary | ICD-10-CM | POA: Diagnosis not present

## 2019-11-16 DIAGNOSIS — E1129 Type 2 diabetes mellitus with other diabetic kidney complication: Secondary | ICD-10-CM | POA: Diagnosis not present

## 2019-11-16 DIAGNOSIS — R809 Proteinuria, unspecified: Secondary | ICD-10-CM

## 2019-11-16 MED ORDER — CONTOUR NEXT TEST VI STRP: ORAL_STRIP | 0 refills | Status: DC

## 2019-11-16 MED ORDER — LISINOPRIL 10 MG PO TABS: 10.0000 mg | ORAL_TABLET | Freq: Every day | ORAL | 2 refills | Status: DC

## 2019-11-16 NOTE — Patient Instructions (Signed)
More sugars after supper

## 2019-11-20 ENCOUNTER — Telehealth: Payer: Self-pay | Admitting: Endocrinology

## 2019-11-20 ENCOUNTER — Other Ambulatory Visit: Payer: Self-pay

## 2019-11-20 ENCOUNTER — Other Ambulatory Visit: Payer: Self-pay | Admitting: Endocrinology

## 2019-11-20 MED ORDER — CONTOUR NEXT TEST VI STRP: ORAL_STRIP | 3 refills | Status: DC

## 2019-11-20 NOTE — Telephone Encounter (Signed)
Rx sent 

## 2019-11-20 NOTE — Telephone Encounter (Signed)
MEDICATION: Contour Next Strips  PHARMACY:  [facility] on [address]  IS THIS A 90 DAY SUPPLY :   IS PATIENT OUT OF MEDICATION:   IF NOT; HOW MUCH IS LEFT:   LAST APPOINTMENT DATE: @12 /06/2019  NEXT APPOINTMENT DATE:@3 /12/2019  DO WE HAVE YOUR PERMISSION TO LEAVE A DETAILED MESSAGE:  OTHER COMMENTS:    **Let patient know to contact pharmacy at the end of the day to make sure medication is ready. **  ** Please notify patient to allow 48-72 hours to process**  **Encourage patient to contact the pharmacy for refills or they can request refills through [facility]**

## 2019-12-06 ENCOUNTER — Telehealth: Payer: Self-pay

## 2019-12-06 NOTE — Telephone Encounter (Signed)
PA initiated via [email or web address] for Tresiba U200 injecting 60 units under the skin once daily.   Yadriel Martinique (Key: [id number]) Tyler Aas FlexTouch (insulin degludec injection) 100 Units/mL solution   Form [employer] [place] Medicare Part D General Authorization Form Created 2 hours ago Sent to Plan 3 minutes ago Plan Response 3 minutes ago Submit Clinical Questions less than a minute ago Determination Wait for Determination Please wait for [employer] [place] Med[employer] cb central to return a determination.   Your information has been submitted to [place]. [employer] [place] will review the request and notify you of the determination decision directly, typically within 3 business days of your submission and once all necessary information is received.  You will also receive your request decision electronically. To check for an update later, open the request again from your dashboard.  If [employer] [place] has not responded within the specified timeframe or if you have any questions about your PA submission, contact [place]  directly at [facility]) [phone] or ([place]) [phone].

## 2019-12-12 NOTE — Telephone Encounter (Signed)
Received fax from [facility] [place] stating that the pt has been approved for coverage of Tresiba Flextouch. Authorization for this will expire on 12/05/2020.

## 2020-01-01 ENCOUNTER — Other Ambulatory Visit: Payer: Self-pay | Admitting: Endocrinology

## 2020-01-25 DIAGNOSIS — E119 Type 2 diabetes mellitus without complications: Secondary | ICD-10-CM | POA: Diagnosis not present

## 2020-01-25 DIAGNOSIS — H52203 Unspecified astigmatism, bilateral: Secondary | ICD-10-CM | POA: Diagnosis not present

## 2020-01-25 DIAGNOSIS — H5213 Myopia, bilateral: Secondary | ICD-10-CM | POA: Diagnosis not present

## 2020-01-25 DIAGNOSIS — H524 Presbyopia: Secondary | ICD-10-CM | POA: Diagnosis not present

## 2020-01-30 ENCOUNTER — Telehealth: Payer: Self-pay | Admitting: Orthopedic Surgery

## 2020-01-30 DIAGNOSIS — M1711 Unilateral primary osteoarthritis, right knee: Secondary | ICD-10-CM

## 2020-01-30 NOTE — Telephone Encounter (Signed)
Pt called in stating that Dr. Marlou Sa told him if he ever needed a rheumatologist Dr. Marlou Sa would refer him to someone.   [phone]

## 2020-01-30 NOTE — Telephone Encounter (Signed)
Please advise. Thanks.  

## 2020-01-31 NOTE — Telephone Encounter (Signed)
Pls rf to [facility] thx

## 2020-01-31 NOTE — Telephone Encounter (Signed)
Referral placed. Called LMVM for patient advising

## 2020-02-12 ENCOUNTER — Other Ambulatory Visit: Payer: Self-pay

## 2020-02-12 ENCOUNTER — Other Ambulatory Visit (INDEPENDENT_AMBULATORY_CARE_PROVIDER_SITE_OTHER): Payer: Medicare Other

## 2020-02-12 DIAGNOSIS — E1165 Type 2 diabetes mellitus with hyperglycemia: Secondary | ICD-10-CM

## 2020-02-12 DIAGNOSIS — E1129 Type 2 diabetes mellitus with other diabetic kidney complication: Secondary | ICD-10-CM

## 2020-02-12 DIAGNOSIS — Z794 Long term (current) use of insulin: Secondary | ICD-10-CM | POA: Diagnosis not present

## 2020-02-12 DIAGNOSIS — R809 Proteinuria, unspecified: Secondary | ICD-10-CM

## 2020-02-12 LAB — BASIC METABOLIC PANEL
BUN: 20 mg/dL (ref 6–23)
CO2: 32 mEq/L (ref 19–32)
Calcium: 9.3 mg/dL (ref 8.4–10.5)
Chloride: 103 mEq/L (ref 96–112)
Creatinine, Ser: 1.46 mg/dL (ref 0.40–1.50)
GFR: 46.57 mL/min — ABNORMAL LOW (ref >60.00)
Glucose, Bld: 142 mg/dL — ABNORMAL HIGH (ref 70–99)
Potassium: 3.9 mEq/L (ref 3.5–5.1)
Sodium: 143 mEq/L (ref 135–145)

## 2020-02-12 LAB — HEMOGLOBIN A1C: Hgb A1c MFr Bld: 7.3 % — ABNORMAL HIGH (ref 4.6–6.5)

## 2020-02-12 LAB — MICROALBUMIN / CREATININE URINE RATIO
Creatinine,U: 67.3 mg/dL
Microalb Creat Ratio: 52.7 mg/g — ABNORMAL HIGH (ref 0.0–30.0)
Microalb, Ur: 35.5 mg/dL — ABNORMAL HIGH (ref 0.0–1.9)

## 2020-02-13 DIAGNOSIS — R05 Cough: Secondary | ICD-10-CM | POA: Diagnosis not present

## 2020-02-15 ENCOUNTER — Other Ambulatory Visit: Payer: Self-pay

## 2020-02-15 ENCOUNTER — Encounter: Payer: Self-pay | Admitting: Endocrinology

## 2020-02-15 ENCOUNTER — Ambulatory Visit: Payer: Medicare Other | Admitting: Endocrinology

## 2020-02-15 VITALS — BP 126/70 | HR 71 | Ht 69.0 in | Wt 259.0 lb

## 2020-02-15 DIAGNOSIS — E1165 Type 2 diabetes mellitus with hyperglycemia: Secondary | ICD-10-CM

## 2020-02-15 DIAGNOSIS — R809 Proteinuria, unspecified: Secondary | ICD-10-CM | POA: Diagnosis not present

## 2020-02-15 DIAGNOSIS — E1129 Type 2 diabetes mellitus with other diabetic kidney complication: Secondary | ICD-10-CM | POA: Diagnosis not present

## 2020-02-15 DIAGNOSIS — Z794 Long term (current) use of insulin: Secondary | ICD-10-CM | POA: Diagnosis not present

## 2020-02-15 NOTE — Patient Instructions (Signed)
Check blood sugars on waking up 5 days a week  Also check blood sugars about 2 hours after meals and do this after different meals by rotation  Recommended blood sugar levels on waking up are 90-130 and about 2 hours after meal is 130-180  Please bring your blood sugar monitor to each visit, thank you

## 2020-02-15 NOTE — Progress Notes (Signed)
Patient ID: Randall Wilkins, male   DOB: June 17, 1941, 79 y.o.   MRN: [id number]           Reason for Appointment: Follow-up for Type 2 Diabetes   History of Present Illness:          Date of diagnosis of type 2 diabetes mellitus:   1998       Background history:   Has been on insulin since about 2001 He thinks he was given metformin only for a month and may have stopped because of renal dysfunction Also Actos was tried and not clear what side effects he had Does not remember other medication he may have tried but has not tried Victoza or Byetta   Recent history:    INSULIN regimen is:   [place] 60 units at around 8 PM .  Humalog 15-20 units at breakfast and 15 after lunch and 15- 20 at dinner     Non-insulin hypoglycemic drugs the patient is taking are: Victoza 1.2 mg daily, Metformin 750 mg daily  His A1c is 7.3, was 6.8 His previous range about 7.1-8.1  He is here for his 51-monthvisit  Current management, blood sugar patterns and problems identified:   Blood sugar readings were reviewed from his Contour meter download.  His meter had a long time programmed as seen on his download and is off almost 12 hours and difficult to assess his patterns  He thinks he is continuing his Metformin as before although he cannot seem to remember correctly  He says about 2 weeks ago his blood sugars were much higher in the mornings for no apparent reason but they have been generally fairly good for the last week or 10 days  As usual his blood sugars appear to be the lowest before dinnertime and highest in the morning  Recently does not appear to be checking his blood sugars after dinner but difficult to determine this on his download today  He has gradually lost some weight since last year, previously as much is 267 pounds  He has not changed his TTyler Aasdespite variability in his blood sugars and may take some more Humalog  Although he thinks he is adjusting his Humalog based on what  he is eating is likely doing it also based on premeal blood sugars and may take between 15 and 25 minutes at any given meal  He does not mind taking multiple injections including Victoza every morning  No hypoglycemia  As before occasionally may have some sweets at times    Glucose monitoring:  done about 3 + times a day         Glucometer:  contour      Blood Glucose readings by DOWNLOAD for the last 2 weeks:    PRE-MEAL Fasting Lunch Dinner Bedtime Overall  Glucose range:  114-236  93-216  89-167 ?   Mean/median:  166  147  136   153   Previous readings:   PRE-MEAL Fasting Lunch Dinner Bedtime Overall  Glucose range:  86-148  79-178  88-133    Mean/median:  121  133  109   134   POST-MEAL PC Breakfast PC Lunch PC Dinner  Glucose range:  137, 195   115, 271  Mean/median:        Self-care:  Meal times are:  Breakfast is at 8 a.m., lunch: 12 noon Dinner: 5-6 PM   Typical meal intake: Breakfast is sometimes doughnuts or sometimes egg sandwich   lunch is a  sandwich or cottage cheese/food, dinner is meat and vegetables/solid    Snacks: Sugar-free Jell-O or pudding, crackers          Dietician visit, most recent: 12/2017               CDE 8/18  Exercise:  unable to do any  Weight history: Range 252-280 previously  Wt Readings from Last 3 Encounters:  02/15/20 259 lb (117.5 kg)  11/16/19 264 lb 3.2 oz (119.8 kg)  08/31/19 267 lb (121.1 kg)    Glycemic control:   Lab Results  Component Value Date   HGBA1C 7.3 (H) 02/12/2020   HGBA1C 6.8 (H) 11/14/2019   HGBA1C 7.0 (H) 08/29/2019   Lab Results  Component Value Date   MICROALBUR 35.5 (H) 02/12/2020   LDLCALC 43 05/23/2019   CREATININE 1.46 02/12/2020   Lab Results  Component Value Date   MICRALBCREAT 52.7 (H) 02/12/2020    Lab Results  Component Value Date   FRUCTOSAMINE 252 12/17/2017   FRUCTOSAMINE 233 08/09/2017      Allergies as of 02/15/2020      Reactions   Actos [pioglitazone] Swelling    Metformin And Related Nausea Only   Niaspan [niacin Er] Itching, Rash      Medication List       Accurate as of February 15, 2020 12:53 PM. If you have any questions, ask your nurse or doctor.        allopurinol 300 MG tablet Commonly known as: ZYLOPRIM Take 1 tablet by mouth daily. Take 1 tab by my mouth daily   amitriptyline 50 MG tablet Commonly known as: ELAVIL Take 50 mg by mouth daily.   aspirin EC 81 MG tablet Take 1 tablet (81 mg total) by mouth daily.   colchicine 0.6 MG tablet Take 0.6 mg by mouth daily as needed.   Contour Next Monitor w/Device Kit 1 Device by Does not apply route 3 (three) times daily. Use to check blood sugars 3 times daily. Dx Code E13.9   Contour Next Test test strip Generic drug: glucose blood USE TO CHECK GLUCOSE 4 TIMES DAILY. DX:E11.65   donepezil 10 MG tablet Commonly known as: ARICEPT TAKE ONE TABLET DAILY.  PLEASE CALL [phone] TO SCHEDULE FOLLOW UP.   Easy Comfort Pen Needles 33G X 4 MM Misc Generic drug: Insulin Pen Needle 1 each by Does not apply route See admin instructions. Use to inject insulin 5 times daily.   insulin lispro 100 UNIT/ML injection Commonly known as: HumaLOG INJECT 25 TO 30 UNITS UNDER THE SKIN IN THE MORNING, 30 UNITS AT LUNCH AND 30 UNITS IN THE EVENING What changed: additional instructions   levothyroxine 75 MCG tablet Commonly known as: SYNTHROID   lisinopril 10 MG tablet Commonly known as: ZESTRIL Take 1 tablet (10 mg total) by mouth daily.   metFORMIN 750 MG 24 hr tablet Commonly known as: GLUCOPHAGE-XR Take 1 tablet with dinner   metoprolol succinate 25 MG 24 hr tablet Commonly known as: TOPROL-XL Take 1 tablet (25 mg total) by mouth daily.   NON FORMULARY Inhale 1 application into the lungs at bedtime. CPAP   potassium chloride SA 20 MEQ tablet Commonly known as: KLOR-CON Take 20 mEq by mouth 2 (two) times daily.   rosuvastatin 20 MG tablet Commonly known as: CRESTOR Take 20 mg by  mouth daily.   tamsulosin 0.4 MG Caps capsule Commonly known as: FLOMAX Take 0.4 mg by mouth daily.   torsemide 10 MG tablet Commonly known as:  DEMADEX Take 25m by mouth alternating with 1010m   TrTyler AaslexTouch 200 UNIT/ML FlexTouch Pen Generic drug: insulin degludec Inject 60 Units into the skin daily.   Victoza 18 MG/3ML Sopn Generic drug: liraglutide INJECT 1.2MG (0.25m80mINTO SKIN DAILY       Allergies:  Allergies  Allergen Reactions  . Actos [Pioglitazone] Swelling  . Metformin And Related Nausea Only  . Niaspan [Niacin Er] Itching and Rash    Past Medical History:  Diagnosis Date  . Aortic stenosis, mild 11/14/2013  . Aortic valve sclerosis 03/29/2015  . Bilateral leg edema 05/21/2014  . CAD (coronary artery disease)   . Chronic diastolic congestive heart failure ([facility] . Chronic kidney disease (CKD), stage III (moderate)   . Diabetes 1.5, managed as type 1 ([facility]CC[place]/22/2014  . Diabetic peripheral neuropathy associated with type 1 diabetes mellitus (HCC[place]2/01/2013  . Dysphagia   . Dyspnea on exertion 03/21/2015  . Exogenous obesity   . Gout   . Heart attack ([facility] . History of nuclear stress test 07/2011   dipyridamole; fixed inferolateral defect, worse at stress than rest; no reversible ischemia; low risk scan   . Hyperlipidemia   . Hypertension   . Insulin dependent diabetes mellitus   . Left foot drop   . Left main coronary artery disease 03/28/2015  . Memory loss   . Obesity (BMI 30-39.9) 11/14/2013  . Obstructive sleep apnea 03/21/2015  . OSA on CPAP    uses a cpap  . Peptic ulcer with hemorrhage 03/28/2015  . Peripheral neuropathy   . Rhabdomyolysis   . 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 Stroke ([facility]CC[place]  L patietal with small scattered lacunar infarcts  . Thrombocytopenia (HCC[place]/06/2015  . Venous insufficiency   . Weakness generalized 03/21/2015    Past Surgical History:  Procedure Laterality Date  . BACK  SURGERY  2002   lumbosacral  . Carotid Doppler  03/2013   bilat bulb/prox ICAs - mild amount of fibrous plaque with no evidence of diameter reduction  . CARPAL TUNNEL RELEASE Bilateral 08/09/2014   Procedure: BILATERAL CARPAL TUNNEL RELEASE;  Surgeon: GarDaryll BrodD;  [facility];  Laterality: Bilateral;  ANESTHESIA:  IV REGIONAL BIL FAB  . CHOLECYSTECTOMY    . COLONOSCOPY    . CORONARY ANGIOPLASTY  10/13/1996  . CORONARY ANGIOPLASTY  09/21/1989   emergency PTCA  . CORONARY ANGIOPLASTY  10/13/1996   Multi-Link diagonal & OD stenting (Dr. R. Marella Chimes. CORONARY ANGIOPLASTY  12/03/1997   disease of mid DX-1 ~50% & in mid PLA & PDA (distal lesions) (Dr. R. Marella Chimes . CORONARY ANGIOPLASTY  10/14/1999   progression of disease distal PLA & PDA; progression of disease prox RCA - moderate (Dr. R. Marella Chimes . CORONARY ANGIOPLASTY WITH STENT PLACEMENT  04/04/2004   [id number]-DES (thrombectomy via AngioJet) to RCA for high grade stenosis (Dr. R. WMarella Chimes CORONARY ARTERY BYPASS GRAFT N/A 03/29/2015   Procedure: CORONARY ARTERY BYPASS GRAFTING TIMES TWO USING LEFT INTERNAL MAMMARY ARTERY AND RIGHT LEG GREATER SAPHENOUS VEIN HARVESTED ENDOSCOPICALLY.;  Surgeon: ClarRexene Alberts;  [facility];  Laterality: N/A;  . ESOPHAGOGASTRODUODENOSCOPY N/A 03/27/2015   Procedure: ESOPHAGOGASTRODUODENOSCOPY (EGD);  Surgeon: MarcClarene Essex;  [facility];  Service: Endoscopy;  Laterality: N/A;  possible dilation  . LEFT  HEART CATHETERIZATION WITH CORONARY ANGIOGRAM N/A 03/28/2015   Procedure: LEFT HEART CATHETERIZATION WITH CORONARY ANGIOGRAM;  Surgeon: Peter M Martinique, MD;  Location[facility] CATH LAB;  Service: Cardiovascular;  Laterality: N/A;  . ROTATOR CUFF REPAIR Right   . SINUS ENDO W/FUSION    . TONSILLECTOMY    . TRANSTHORACIC ECHOCARDIOGRAM  08/08/2013   EF 55-60%, mild conc hypertrophy, grade 1 diastolic  dysfunction; AV with mild stenosis; LA & RA mildly dilated    Family History  Problem Relation Age of Onset  . Heart disease Mother   . Coronary artery disease Father   . Cancer Maternal Grandmother   . Heart Problems Maternal Grandfather     Social History:  reports that he quit smoking about 47 years ago. His smoking use included cigarettes. He has a 40.00 pack-year smoking history. He has never used smokeless tobacco. He reports that he does not drink alcohol or use drugs.   Review of Systems   Lipid history: Last LDL  on 20 mg rosuvastatin, followed by PCP Has history of CAD    Lab Results  Component Value Date   CHOL 98 05/23/2019   HDL 38.40 (L) 05/23/2019   LDLCALC 43 05/23/2019   TRIG 83.0 05/23/2019   CHOLHDL 3 05/23/2019           Hypertension: Mild and treated with 5 mg lisinopril which he takes twice a day now and metoprolol  His cardiologist had reduced his lisinopril when blood pressure was low with a 20 mg dose He is now taking 46m that was prescribed on his last visit  He has not checked his blood pressure at home Blood pressure appears better today  BP Readings from Last 3 Encounters:  02/15/20 126/70  11/16/19 (!) 142/72  08/31/19 120/70     Renal function: Mildly abnormal with some fluctuation Microalbuminuria present  Lab Results  Component Value Date   CREATININE 1.46 02/12/2020   CREATININE 1.31 11/14/2019   CREATININE 1.47 08/29/2019    Most recent eye exam was in 2/20, Dr. SGershon Craneand reportedly no retinopathy   Complications of diabetes: Peripheral neuropathy with sensory loss.  Dementia: Followed by neurologist and is on Aricept   LABS:  Lab on 02/12/2020  Component Date Value Ref Range Status  . Microalb, Ur 02/12/2020 35.5* 0.0 - 1.9 mg/dL Final  . Creatinine,U 02/12/2020 67.3  mg/dL Final  . Microalb Creat Ratio 02/12/2020 52.7* 0.0 - 30.0 mg/g Final  . Sodium 02/12/2020 143  135 - 145 mEq/L Final  . Potassium  02/12/2020 3.9  3.5 - 5.1 mEq/L Final  . Chloride 02/12/2020 103  96 - 112 mEq/L Final  . CO2 02/12/2020 32  19 - 32 mEq/L Final  . Glucose, Bld 02/12/2020 142* 70 - 99 mg/dL Final  . BUN 02/12/2020 20  6 - 23 mg/dL Final  . Creatinine, Ser 02/12/2020 1.46  0.40 - 1.50 mg/dL Final  . GFR 02/12/2020 46.57* >60.00 mL/min Final  . Calcium 02/12/2020 9.3  8.4 - 10.5 mg/dL Final  . Hgb A1c MFr Bld 02/12/2020 7.3* 4.6 - 6.5 % Final   Glycemic Control Guidelines for People with Diabetes:Non Diabetic:  <6%Goal of Therapy: <7%Additional Action Suggested:  >8%     Physical Examination:  BP 126/70 (BP Location: Left Arm, Patient Position: Sitting, Cuff Size: Large)   Pulse 71   Ht '5\' 9"'  (1.753 m)   Wt 259 lb (117.5 kg)   SpO2 94%   BMI 38.25 kg/m  ASSESSMENT:  Diabetes type 2, BMI 38+, insulin-dependent with obesity and history of neuropathy  See history of present illness for detailed discussion of current diabetes management, blood sugar patterns and problems identified  His A1c is slightly higher at 7.3 compared to 6.8  He is on basal bolus insulin regimen, Metformin and Victoza Overall still taking relatively large doses of insulin and about 120 units/day on an average  He is gradually losing weight Some of his high A1c results may be related to higher readings in the mornings last month which he cannot explain but possibly from not refilling his Metformin Without change in his insulin regimen he is appearing to have excellent readings in the last week at least He does try to adjust his mealtime dose based on what he is eating Again discussed that without checking readings after dinner cannot see whether his suppertime dose is appropriately adjusted  Monitor today does not have the right date and time programmed   HYPERTENSION with microalbuminuria: Blood pressure is improved with lisinopril 10 mg Also slightly better microalbumin this time  Renal function is variable and  creatinine high normal now  He is asking about checking for uric acid and has stopped allopurinol on his own  PLAN:    No change in insulin or Victoza Consider increasing Metformin if fasting blood sugars start increasing again More readings couple of hours after dinner and may check your readings before dinner or fasting Given values for target blood sugars on his printout  If he has difficulty with keeping the right date and time consistently on his meter will need to get him a new one Encouraged him to be as active as possible No change in lisinopril He will check with his PCP regarding allopurinol Check microalbumin every 6 months  Patient Instructions  Check blood sugars on waking up 5 days a week  Also check blood sugars about 2 hours after meals and do this after different meals by rotation  Recommended blood sugar levels on waking up are 90-130 and about 2 hours after meal is 130-180  Please bring your blood sugar monitor to each visit, thank you          Elayne Snare 02/15/2020, 12:53 PM   Note: This office note was prepared with Dragon voice recognition system technology. Any transcriptional errors that result from this process are unintentional.

## 2020-02-19 DIAGNOSIS — E1129 Type 2 diabetes mellitus with other diabetic kidney complication: Secondary | ICD-10-CM | POA: Diagnosis not present

## 2020-02-19 DIAGNOSIS — E039 Hypothyroidism, unspecified: Secondary | ICD-10-CM | POA: Diagnosis not present

## 2020-02-19 DIAGNOSIS — I4891 Unspecified atrial fibrillation: Secondary | ICD-10-CM | POA: Diagnosis not present

## 2020-02-19 DIAGNOSIS — I10 Essential (primary) hypertension: Secondary | ICD-10-CM | POA: Diagnosis not present

## 2020-03-07 ENCOUNTER — Ambulatory Visit
Admission: RE | Admit: 2020-03-07 | Discharge: 2020-03-07 | Disposition: A | Discharge status: Home / Self Care | Payer: Medicare Other | Source: Ambulatory Visit | Attending: Family Medicine | Admitting: Family Medicine

## 2020-03-07 ENCOUNTER — Other Ambulatory Visit: Payer: Self-pay | Admitting: Family Medicine

## 2020-03-07 DIAGNOSIS — R05 Cough: Secondary | ICD-10-CM

## 2020-03-07 DIAGNOSIS — Z0000 Encounter for general adult medical examination without abnormal findings: Secondary | ICD-10-CM | POA: Diagnosis not present

## 2020-03-07 DIAGNOSIS — I2510 Atherosclerotic heart disease of native coronary artery without angina pectoris: Secondary | ICD-10-CM | POA: Diagnosis not present

## 2020-03-07 DIAGNOSIS — E039 Hypothyroidism, unspecified: Secondary | ICD-10-CM | POA: Diagnosis not present

## 2020-03-07 DIAGNOSIS — R059 Cough, unspecified: Secondary | ICD-10-CM

## 2020-03-07 DIAGNOSIS — I10 Essential (primary) hypertension: Secondary | ICD-10-CM | POA: Diagnosis not present

## 2020-03-11 ENCOUNTER — Other Ambulatory Visit: Payer: Self-pay | Admitting: Cardiovascular Disease

## 2020-03-11 NOTE — Telephone Encounter (Signed)
New Message   *STAT* If patient is at the pharmacy, call can be transferred to refill team.   1. Which medications need to be refilled? (please list name of each medication and dose if known) metoprolol succinate (TOPROL-XL) 25 MG 24 hr tablet  2. Which pharmacy/location (including street and city if local pharmacy) is medication to be sent to? [facility] [place] [place]  3. Do they need a 30 day or 90 day supply? 90 day     Patient is out of this medication and needs prescriptions called in today. Patient needs the 90 day supply sent to [place] and a short supply sent to     *STAT* If patient is at the pharmacy, call can be transferred to refill team.   1. Which medications need to be refilled? (please list name of each medication and dose if known) metoprolol succinate (TOPROL-XL) 25 MG 24 hr tablet  2. Which pharmacy/location (including street and city if local pharmacy) is medication to be sent to? [facility] [place], [place] - [address]  3. Do they need a 30 day or 90 day supply? 90 day

## 2020-03-13 ENCOUNTER — Other Ambulatory Visit: Payer: Self-pay

## 2020-03-13 ENCOUNTER — Encounter: Payer: Self-pay | Admitting: Internal Medicine

## 2020-03-13 ENCOUNTER — Ambulatory Visit: Payer: Medicare Other | Admitting: Internal Medicine

## 2020-03-13 VITALS — BP 142/70 | HR 76 | Temp 97.9°F | Wt 254.0 lb

## 2020-03-13 DIAGNOSIS — I350 Nonrheumatic aortic (valve) stenosis: Secondary | ICD-10-CM

## 2020-03-13 DIAGNOSIS — I4891 Unspecified atrial fibrillation: Secondary | ICD-10-CM

## 2020-03-13 DIAGNOSIS — Z951 Presence of aortocoronary bypass graft: Secondary | ICD-10-CM | POA: Diagnosis not present

## 2020-03-13 DIAGNOSIS — G4733 Obstructive sleep apnea (adult) (pediatric): Secondary | ICD-10-CM | POA: Diagnosis not present

## 2020-03-13 DIAGNOSIS — Z8673 Personal history of transient ischemic attack (TIA), and cerebral infarction without residual deficits: Secondary | ICD-10-CM

## 2020-03-13 MED ORDER — APIXABAN 5 MG PO TABS: 5.0000 mg | ORAL_TABLET | Freq: Two times a day (BID) | ORAL | 11 refills | Status: DC

## 2020-03-13 MED ORDER — METOPROLOL SUCCINATE ER 25 MG PO TB24: 25.0000 mg | ORAL_TABLET | Freq: Every day | ORAL | 3 refills | Status: DC

## 2020-03-13 NOTE — Telephone Encounter (Signed)
Pt has not been seen by [facility] since 01-2019, d/w wife she states that pt is still sleeping and she could help. D/w wife all questions answered, appt made for today he will arrive 130pm for this appt.

## 2020-03-13 NOTE — Patient Instructions (Signed)
Medication Instructions:  START eliquis 5mg  twice daily Continue other current medications   *If you need a refill on your cardiac medications before your next appointment, please call your pharmacy*   Testing/Procedures: Your physician has requested that you have an echocardiogram. Echocardiography is a painless test that uses sound waves to create images of your heart. It provides your doctor with information about the size and shape of your heart and how well your heart's chambers and valves are working. This procedure takes approximately one hour. There are no restrictions for this procedure. -- to be scheduled @ [address]   Follow-Up: At [facility], you and your health needs are our priority.  As part of our continuing mission to provide you with exceptional heart care, we have created designated Provider Care Teams.  These Care Teams include your primary Cardiologist (physician) and Advanced Practice Providers (APPs -  Physician Assistants and Nurse Practitioners) who all work together to provide you with the care you need, when you need it.  We recommend signing up for the patient portal called "MyChart".  Sign up information is provided on this After Visit Summary.  MyChart is used to connect with patients for Virtual Visits (Telemedicine).  Patients are able to view lab/test results, encounter notes, upcoming appointments, etc.  Non-urgent messages can be sent to your provider as well.   To learn more about what you can do with MyChart, go to [email or web address].    Your next appointment:   4 week(s)  The format for your next appointment:   In Person  Provider:   You may see Dr. Debara Pickett or one of the following Advanced Practice Providers on your designated Care Team:    Almyra Deforest, PA-C  Fabian Sharp, [place] or   Roby Lofts, [place]    Other Instructions

## 2020-03-13 NOTE — Progress Notes (Signed)
OFFICE NOTE  Chief Complaint:  Fatigue, shortness of breath  Primary Care Physician: [name], MD  HPI:  Randall Wilkins is a pleasant 79 year old male patient who was formerly followed by Dr. Rollene Fare. He is here to establish cardiac care with [place] today. He has a history of obesity, multiple comorbidities including insulin-dependent diabetes, diabetic peripheral neuropathy, diabetic nephropathy, venous insufficiency and peripheral neuropathy. He also has obstructive sleep apnea on CPAP and coronary artery disease. He unfortunately suffered a left parietal stroke with small lacunar infarcts noted on MRI in 2013. He is a left foot drop from an L4/L5 neuropathy and wears a brace for this. From a cardiac standpoint he had a large non-drug-eluting stent placed in the RCA in 2005. He has had a stent placed at 89 to a diagonal which was noted to be patent. He's had some problems with dizziness which is improved. He [place] as large to medium secondary to neuropathy and venous insufficiency. Unfortunately not been able to lose a lot of weight due to his difficulty in ambulating and painful neuropathy.  He denies any cardiac chest pain or worsening shortness of breath.  Randall Wilkins returns today for a six-month followup. He is having no complaints. He does report some lower extremity swelling which is stable. He has mild aortic stenosis which is been stable as well. His last echo was in August 2014. He denies any chest pain or worsening shortness of breath. He recently had lab work to Dr. Eugenio Hoes office which we will obtain.  I saw Randall Wilkins back today for hospital follow-up. He is reportedly doing much better. He has more energy and no significant chest discomfort. Between our last appointment see presented with a urinary tract infection and ultimately had chest pain and profuse diaphoresis. He was found to have elevated troponin and underwent cardiac catheterization. This demonstrated the following:   Coronary angiography: Coronary dominance: right  Left mainstem: 95% distal left main stenosis.  Left anterior descending (LAD): There is 95% ostial LAD stenosis. The LAD is a large vessel. The LAD and diagonal otherwise are without significant disease.  There is a large ramus intermediate branch with 99% ostial stenosis.  Left circumflex (LCx): 100% ostial occlusion with faint left to left collaterals.  Right coronary artery (RCA): the RCA is a large dominant branch. The stent in the proximal vessel is widely patent. There is a 60% stenosis in the mid PDA. The third PLOM is moderate in size with a long 90-95% stenosis prior to distal arborization.   Subsequently he underwent emergent coronary artery bypass grafting with a LIMA to LAD and SVG to OM branch. He is noted to have mild aortic sclerosis versus stenosis, however the valve was not severe enough to be addressed. Since discharge he is more active and is starting cardiac rehabilitation today.  Randall Wilkins returns today for follow-up. Overall he is feeling well and continues to improve in his ambulation and stamina. He denies any chest pain or shortness of breath. He is rehabilitating well. Recent laboratory work shows excellent cholesterol control and total cholesterol 130, HDL 42 LDL 61 and triglycerides 137. His only concern is the cost of medications.  I saw Randall Wilkins back today in the office. He denies any chest pain or worsening shortness of breath. Unfortunately he's had about several weeks of diarrhea which started when he went to the [facility] area. It was not felt to be infectious any seeing a gastroenterologist about this. Otherwise he is  without cardiac complaints. Blood pressure today was low at 114/52. He's managed to lose only a couple of pounds but generally his appetite stools been pretty good despite the diarrhea. He is planning on having an upcoming injection and I received a request to hold his Plavix for 5 days prior to  that. He is currently holding it will have his injection on Monday. Based on this I reviewed his records and I do not see a clear ongoing indication for the Plavix in addition to aspirin. Now that he's been surgically revascularized.   03/04/2017  Randall Wilkins returns today for follow-up. Recently he's had some dizziness and apparently had lower blood pressure. He backed off on his lisinopril to 2.5 mg daily, or at least he says he is taking a half tablet. Will try to figure out what the dose was. EKG today shows sinus bradycardia with either a first degree AV block of great significance or possibly atrial fibrillation. It's difficult to see consistent P waves with normal PP intervals. The RR intervals are slightly irregular. He has had sinus with a long first-degree AV block of 340 ms in the past. This is important to determine as he has had prior stroke and would necessitate anticoagulation.  03/23/2018  Randall Wilkins returns today for follow-up.  He has no specific complaints.  He continues to have a little instability with his gait.  He is using a cane today but has a walker at home.  Recently he has had the best blood sugars that he has had in a while.  He started seeing Dr. Dwyane Dee, who added Victoza.  He is also on [place] and Humalog.  He was in a clinical trial with [employer], and is to be considered given his history of coronary disease and diabetes for mortality benefit.  I will defer to Dr. Dwyane Dee.  Blood pressure is well controlled today.  Weight remains excessive.  EKG personally reviewed shows sinus rhythm with PVCs for which he is unaware of.  02/07/2019  Randall Wilkins returns today for follow-up.  He is done well and has no new complaints.  Recently seen by Dr. Claiborne Billings and adjustments were made to his CPAP.  He is reportedly 100% compliant.  He was noted to have a type I winky block on EKG a couple weeks ago and his Toprol-XL was reduced from 37.5 to 25 mg.  He seems to be tolerating this however blood  pressures accordingly gone up.  Blood pressure today was 155/71 and home blood pressures are in the 150s.  03/13/2020  Randall Wilkins is seen today in follow-up.  He is accompanied by his wife.  He scheduled to see Dr. Claiborne Billings in a couple weeks.  Recently he has been struggling with shortness of breath and fatigue.  He saw his primary care provider and diagnosed with a pneumonia.  He has been on antibiotics and does seem to be improving.  He has somewhat of a productive cough.  An EKG was performed today and shows atrial fibrillation controlled ventricular response.  This is newly documented.  He does not have known history of this although had had a prior stroke.  He is on low-dose aspirin.  PMHx:  Past Medical History:  Diagnosis Date  . Aortic stenosis, mild 11/14/2013  . Aortic valve sclerosis 03/29/2015  . Bilateral leg edema 05/21/2014  . CAD (coronary artery disease)   . Chronic diastolic congestive heart failure ([place])   . Chronic kidney disease (CKD), stage III (moderate)   . Diabetes  1.5, managed as type 1 ([place]) 02/04/2013  . Diabetic peripheral neuropathy associated with type 1 diabetes mellitus ([place]) 11/14/2013  . Dysphagia   . Dyspnea on exertion 03/21/2015  . Exogenous obesity   . Gout   . Heart attack ([place])   . History of nuclear stress test 07/2011   dipyridamole; fixed inferolateral defect, worse at stress than rest; no reversible ischemia; low risk scan   . Hyperlipidemia   . Hypertension   . Insulin dependent diabetes mellitus   . Left foot drop   . Left main coronary artery disease 03/28/2015  . Memory loss   . Obesity (BMI 30-39.9) 11/14/2013  . Obstructive sleep apnea 03/21/2015  . OSA on CPAP    uses a cpap  . Peptic ulcer with hemorrhage 03/28/2015  . Peripheral neuropathy   . Rhabdomyolysis   . 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 Stroke ([place])    L patietal with small scattered lacunar infarcts  . Thrombocytopenia ([place]) 03/21/2015  .  Venous insufficiency   . Weakness generalized 03/21/2015    Past Surgical History:  Procedure Laterality Date  . BACK SURGERY  2002   lumbosacral  . Carotid Doppler  03/2013   bilat bulb/prox ICAs - mild amount of fibrous plaque with no evidence of diameter reduction  . CARPAL TUNNEL RELEASE Bilateral 08/09/2014   Procedure: BILATERAL CARPAL TUNNEL RELEASE;  Surgeon: Daryll Brod, MD;  Location: [facility];  Service: Orthopedics;  Laterality: Bilateral;  ANESTHESIA:  IV REGIONAL BIL FAB  . CHOLECYSTECTOMY    . COLONOSCOPY    . CORONARY ANGIOPLASTY  10/13/1996  . CORONARY ANGIOPLASTY  09/21/1989   emergency PTCA  . CORONARY ANGIOPLASTY  10/13/1996   Multi-Link diagonal & OD stenting (Dr. Marella Chimes)  . CORONARY ANGIOPLASTY  12/03/1997   disease of mid DX-1 ~50% & in mid PLA & PDA (distal lesions) (Dr. Marella Chimes)   . CORONARY ANGIOPLASTY  10/14/1999   progression of disease distal PLA & PDA; progression of disease prox RCA - moderate (Dr. Marella Chimes)   . CORONARY ANGIOPLASTY WITH STENT PLACEMENT  04/04/2004   4.0x64m non-DES (thrombectomy via AngioJet) to RCA for high grade stenosis (Dr. RMarella Chimes  . CORONARY ARTERY BYPASS GRAFT N/A 03/29/2015   Procedure: CORONARY ARTERY BYPASS GRAFTING TIMES TWO USING LEFT INTERNAL MAMMARY ARTERY AND RIGHT LEG GREATER SAPHENOUS VEIN HARVESTED ENDOSCOPICALLY.;  Surgeon: CRexene Alberts MD;  [facility]  Service: Open Heart Surgery;  Laterality: N/A;  . ESOPHAGOGASTRODUODENOSCOPY N/A 03/27/2015   Procedure: ESOPHAGOGASTRODUODENOSCOPY (EGD);  Surgeon: MClarene Essex MD;  [facility];  Service: Endoscopy;  Laterality: N/A;  possible dilation  . LEFT HEART CATHETERIZATION WITH CORONARY ANGIOGRAM N/A 03/28/2015   Procedure: LEFT HEART CATHETERIZATION WITH CORONARY ANGIOGRAM;  Surgeon: Peter M JMartinique MD;  [facility];  Service: Cardiovascular;  Laterality: N/A;  . ROTATOR CUFF REPAIR Right   . SINUS ENDO W/FUSION     . TONSILLECTOMY    . TRANSTHORACIC ECHOCARDIOGRAM  08/08/2013   EF 55-60%, mild conc hypertrophy, grade 1 diastolic dysfunction; AV with mild stenosis; LA & RA mildly dilated    FAMHx:  Family History  Problem Relation Age of Onset  . Heart disease Mother   . Coronary artery disease Father   . Cancer Maternal Grandmother   . Heart Problems Maternal Grandfather     SOCHx:   reports that he quit smoking about  47 years ago. His smoking use included cigarettes. He has a 40.00 pack-year smoking history. He has never used smokeless tobacco. He reports that he does not drink alcohol or use drugs.  ALLERGIES:  Allergies  Allergen Reactions  . Actos [Pioglitazone] Swelling  . Metformin And Related Nausea Only  . Niaspan [Niacin Er] Itching and Rash    ROS: Pertinent items noted in HPI and remainder of comprehensive ROS otherwise negative.  HOME MEDS: Current Outpatient Medications  Medication Sig Dispense Refill  . allopurinol (ZYLOPRIM) 300 MG tablet Take 1 tablet by mouth daily. Take 1 tab by my mouth daily  2  . amitriptyline (ELAVIL) 50 MG tablet Take 50 mg by mouth daily.    Marland Kitchen aspirin 81 MG tablet Take 1 tablet (81 mg total) by mouth daily.    . Blood Glucose Monitoring Suppl (CONTOUR NEXT MONITOR) w/Device KIT 1 Device by Does not apply route 3 (three) times daily. Use to check blood sugars 3 times daily. Dx Code E13.9 1 kit 2  . colchicine 0.6 MG tablet Take 0.6 mg by mouth daily as needed.    . donepezil (ARICEPT) 10 MG tablet TAKE ONE TABLET DAILY.  PLEASE CALL [phone] TO SCHEDULE FOLLOW UP. 90 tablet 0  . glucose blood (CONTOUR NEXT TEST) test strip USE TO CHECK GLUCOSE 4 TIMES DAILY. DX:E11.65 150 each 3  . Insulin Degludec (TRESIBA FLEXTOUCH) 200 UNIT/ML SOPN Inject 60 Units into the skin daily. 15 mL 1  . insulin lispro (HUMALOG) 100 UNIT/ML injection INJECT 25 TO 30 UNITS UNDER THE SKIN IN THE MORNING, 30 UNITS AT LUNCH AND 30 UNITS IN THE EVENING (Patient taking  differently: Inject 15-25 units under the skin three times daily before meals.) 80 mL 2  . Insulin Pen Needle (EASY COMFORT PEN NEEDLES) 33G X 4 MM MISC 1 each by Does not apply route See admin instructions. Use to inject insulin 5 times daily. 150 each 3  . levothyroxine (SYNTHROID, LEVOTHROID) 75 MCG tablet   1  . lisinopril (ZESTRIL) 10 MG tablet Take 1 tablet (10 mg total) by mouth daily. 90 tablet 2  . metFORMIN (GLUCOPHAGE-XR) 750 MG 24 hr tablet Take 1 tablet with dinner 90 tablet 2  . metoprolol succinate (TOPROL-XL) 25 MG 24 hr tablet Take 1 tablet (25 mg total) by mouth daily. 90 tablet 2  . NON FORMULARY Inhale 1 application into the lungs at bedtime. CPAP    . potassium chloride SA (K-DUR,KLOR-CON) 20 MEQ tablet Take 20 mEq by mouth 2 (two) times daily.     . rosuvastatin (CRESTOR) 20 MG tablet Take 20 mg by mouth daily.    . Tamsulosin HCl (FLOMAX) 0.4 MG CAPS Take 0.4 mg by mouth daily.     Marland Kitchen torsemide (DEMADEX) 10 MG tablet Take 56m by mouth alternating with 1053m    . Marland KitchenICTOZA 18 MG/3ML SOPN INJECT 1.2MG (0.98m90mINTO SKIN DAILY 6 mL 3  . amoxicillin-clavulanate (AUGMENTIN) 875-125 MG tablet Take 1 tablet by mouth 2 (two) times daily.    . furosemide (LASIX) 40 MG tablet Take 80 mg by mouth daily.     No current facility-administered medications for this visit.    LABS/IMAGING: No results found for this or any previous visit (from the past 48 hour(s)). No results found.  VITALS: BP (!) 142/70   Pulse 76   Temp 97.9 F (36.6 C)   Wt 254 lb (115.2 kg)   SpO2 93%   BMI 37.51 kg/m   EXAM:  General appearance: alert and no distress Neck: no carotid bruit and no JVD Lungs: dullness to percussion RLL and rhonchi RLL Heart: regular rate and rhythm, S1, S2 normal, systolic murmur: 3/6 early to mid-peaking systolic murmur 3/6, crescendo at 2nd right intercostal space and No rub Abdomen: obese, soft, non-tender, +BS Extremities: edema trace Pulses: 2+ and symmetric Skin:  Skin color, texture, turgor normal. No rashes or lesions Neurologic: Mental status: Alert, oriented, thought content appropriate, does have memory issues Psych: Pleasant  EKG: A. fib at 63-personally reviewed  ASSESSMENT: 1. New onset atrial fibrillation - CHADSVASC score of 6 2. Recent pneumonia 3. Coronary artery disease status post BMS to the RCA in 2005, recent CABG 2 (LIMA to LAD - for critical left main disease, SVG to OM1)- 2016 4. Obesity 5. Hypertension-controlled 6. Dyslipidemia 7. Prior stroke 8. Diabetic neuropathy 9. Diabetic nephropathy - left foot drop 10. Type 1 diabetes on insulin 11. LE edema 12. Mild aortic stenosis  PLAN: 1.   Randall Wilkins is found today to be in an irregular rhythm and EKG demonstrates atrial fibrillation.  This is a new finding for him and given his history of prior stroke, he has a high CHA2DS2-VASc score of 6.  I am recommending starting Eliquis 5 mg twice daily.  Creatinine was 1.47.  He is also undergoing treatment for a pneumonia.  He does have coarse rhonchi and dull breath sounds in the right base.  He does seem to be improving.  A repeat chest x-ray has been ordered.  It could be that the pneumonia put him out of rhythm.  I would like for him to try to recover from this before we would consider possibly cardioverted back to sinus rhythm.  We will plan at least 4 to 6 weeks of anticoagulation.  They are also planning to go to the beach for a month from mid April to mid May.  I would like to see them back afterwards and if he persists in A. fib we will consider an elective cardioversion.  Finally we will repeat an echocardiogram since his last study was several years ago to further assess his aortic stenosis and LV function in the setting of A. fib.  Pixie Casino, MD, [facility], [place] Director of the [facility]  [facility] Diplomate of the American Board of Clinical  Lipidology Attending Cardiologist  Direct Dial: [phone]  Fax: [phone]  Website:  [email or web address] 03/13/2020, 1:53 PM

## 2020-03-22 ENCOUNTER — Other Ambulatory Visit: Payer: Self-pay

## 2020-03-22 ENCOUNTER — Ambulatory Visit (HOSPITAL_COMMUNITY): Payer: Medicare Other | Attending: Internal Medicine

## 2020-03-22 DIAGNOSIS — I4891 Unspecified atrial fibrillation: Secondary | ICD-10-CM | POA: Insufficient documentation

## 2020-03-22 DIAGNOSIS — I350 Nonrheumatic aortic (valve) stenosis: Secondary | ICD-10-CM | POA: Diagnosis not present

## 2020-03-25 ENCOUNTER — Telehealth: Payer: Self-pay | Admitting: Cardiovascular Disease

## 2020-03-25 NOTE — Telephone Encounter (Signed)
Called and spoke with pt's wife (per DPR) notified that it was fine for her to accompany her husband to his appt on the 14th. Wife verbalized understanding and had no other questions at this time.

## 2020-03-25 NOTE — Telephone Encounter (Signed)
New message:    Patient wife calling stating that she needs to be with the patient for his appt he needs her help with walking and patient doesn't hear well. Please call patient wife if any questions.

## 2020-03-26 ENCOUNTER — Ambulatory Visit
Admission: RE | Admit: 2020-03-26 | Discharge: 2020-03-26 | Disposition: A | Discharge status: Home / Self Care | Payer: Medicare Other | Source: Ambulatory Visit | Attending: Family Medicine | Admitting: Family Medicine

## 2020-03-26 ENCOUNTER — Other Ambulatory Visit: Payer: Self-pay | Admitting: Family Medicine

## 2020-03-26 DIAGNOSIS — J189 Pneumonia, unspecified organism: Secondary | ICD-10-CM | POA: Diagnosis not present

## 2020-03-27 ENCOUNTER — Ambulatory Visit
Admission: RE | Admit: 2020-03-27 | Discharge: 2020-03-27 | Disposition: A | Discharge status: Home / Self Care | Payer: Medicare Other | Source: Ambulatory Visit | Attending: Family Medicine | Admitting: Family Medicine

## 2020-03-27 ENCOUNTER — Other Ambulatory Visit: Payer: Self-pay | Admitting: Family Medicine

## 2020-03-27 ENCOUNTER — Other Ambulatory Visit: Payer: Self-pay

## 2020-03-27 ENCOUNTER — Encounter: Payer: Self-pay | Admitting: Cardiovascular Disease

## 2020-03-27 ENCOUNTER — Ambulatory Visit: Payer: Medicare Other | Admitting: Cardiovascular Disease

## 2020-03-27 VITALS — BP 130/62 | HR 61 | Ht 69.0 in | Wt 253.6 lb

## 2020-03-27 DIAGNOSIS — J690 Pneumonitis due to inhalation of food and vomit: Secondary | ICD-10-CM

## 2020-03-27 DIAGNOSIS — J189 Pneumonia, unspecified organism: Secondary | ICD-10-CM | POA: Diagnosis not present

## 2020-03-27 DIAGNOSIS — I10 Essential (primary) hypertension: Secondary | ICD-10-CM | POA: Diagnosis not present

## 2020-03-27 DIAGNOSIS — G4733 Obstructive sleep apnea (adult) (pediatric): Secondary | ICD-10-CM

## 2020-03-27 DIAGNOSIS — Z7901 Long term (current) use of anticoagulants: Secondary | ICD-10-CM

## 2020-03-27 DIAGNOSIS — I4811 Longstanding persistent atrial fibrillation: Secondary | ICD-10-CM

## 2020-03-27 DIAGNOSIS — I350 Nonrheumatic aortic (valve) stenosis: Secondary | ICD-10-CM | POA: Diagnosis not present

## 2020-03-27 DIAGNOSIS — Z951 Presence of aortocoronary bypass graft: Secondary | ICD-10-CM

## 2020-03-27 MED ORDER — IOPAMIDOL (ISOVUE-300) INJECTION 61%
75.0000 mL | Freq: Once | INTRAVENOUS | Status: AC | PRN
  Administered 2020-03-27: 75 mL via INTRAVENOUS

## 2020-03-27 NOTE — Patient Instructions (Signed)

## 2020-03-27 NOTE — Progress Notes (Signed)
Cardiology Office Note    Date:  04/03/2020   ID:  Randall Wilkins, DOB 1941/09/14, MRN [id number]  PCP:  [name], MD  Cardiologist:  Shelva Majestic, MD (sleep); Dr. Debara Pickett  F/U sleep evaluation  History of Present Illness:  Randall Wilkins is a 79 y.o. male presents for a 49-monthfollow-up sleep evaluation.  Mr. JMartiniqueis a former patient of Dr. 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  He has a longstanding history of sleep apnea with [facility].  Apparently, he was found to have severe sleep apnea with an AHI of 37.7.  Reportedly he was started on BiPAP therapy at 14/9 and he was advised not to sleep supine.  Apparently, the patient received a new machine in 2017 and now has an air sense 10 AutoSet unit which is a CPAP rather than BiPAP unit and since that time he has been on a set pressure of 14 cm.  He has had significant difficulties with his mask breaking.  He has not had any sleep follow-up over the last several years.  He now presents to me to assume his sleep apnea treatment and care.  The patient last saw Dr. HDebara Pickettin April 2019 and saw Dr. KDwyane Deein September 2019.  He has a history of prior stroke in 2013 and has residual left foot drop and L4-5 neuropathy for which he wears a brace.  I saw him for an initial sleep evaluation with me in February 2020.  I obtained a download of his CPAP from December 26, 2018 through January 24, 2019.  He was 100% compliant and is averaging 8 hours and 40 minutes of use per night.  At his 14 cm pressure, apnea hypopnea index was 2.5.  His mask had broken and he was given a mask from his daughter who also had a Mirage Activa LT medium size mask for which she has been using.  He has heated coil tubing.  Presently, he was unaware of breakthrough snoring.  An Epworth Sleepiness Scale score was calculated in the  office and this endorsed at 10 with his highest chance of dozing while watching TV, lying down to rest in the afternoon when circumstances persist, and sitting quietly after lunch without alcohol with slight chance of dozing while sitting inactive in a public place.  He denied any bruxism or restless legs.  He denied hypnagogic hallucinations.   Over the past year, Mr. JMartiniqueadmits to continued compliance.  He has continued to use his old Activa LT mask but on a download obtained in the office today from March 13 through March 24, 2020 he has significant mask leak.  He is 100% compliant with average usage at 9 hours and 12 minutes.  At a set 14 cm pressure AHI is 4.9.  He recently has had issues with foot drop of his left leg for which he wears a left leg brace as well as swelling.  He was recently found to have pneumonia and will be undergoing a CT scan.  He presents for evaluation.  Past Medical History:  Diagnosis Date  . Aortic stenosis, mild 11/14/2013  . Aortic valve sclerosis 03/29/2015  . Bilateral leg edema 05/21/2014  . CAD (coronary artery disease)   . Chronic diastolic congestive heart failure ([facility]   . Chronic kidney disease (CKD), stage III (moderate)   . Diabetes 1.5, managed  as type 1 ([place]) 02/04/2013  . Diabetic peripheral neuropathy associated with type 1 diabetes mellitus ([place]) 11/14/2013  . Dysphagia   . Dyspnea on exertion 03/21/2015  . Exogenous obesity   . Gout   . Heart attack ([place])   . History of nuclear stress test 07/2011   dipyridamole; fixed inferolateral defect, worse at stress than rest; no reversible ischemia; low risk scan   . Hyperlipidemia   . Hypertension   . Insulin dependent diabetes mellitus   . Left foot drop   . Left main coronary artery disease 03/28/2015  . Memory loss   . Obesity (BMI 30-39.9) 11/14/2013  . Obstructive sleep apnea 03/21/2015  . OSA on CPAP    uses a cpap  . Peptic ulcer with hemorrhage 03/28/2015  . Peripheral neuropathy   .  Rhabdomyolysis   . 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 Stroke ([place])    L patietal with small scattered lacunar infarcts  . Thrombocytopenia ([place]) 03/21/2015  . Venous insufficiency   . Weakness generalized 03/21/2015    Past Surgical History:  Procedure Laterality Date  . BACK SURGERY  2002   lumbosacral  . Carotid Doppler  03/2013   bilat bulb/prox ICAs - mild amount of fibrous plaque with no evidence of diameter reduction  . CARPAL TUNNEL RELEASE Bilateral 08/09/2014   Procedure: BILATERAL CARPAL TUNNEL RELEASE;  Surgeon: Daryll Brod, MD;  Location: [facility];  Service: Orthopedics;  Laterality: Bilateral;  ANESTHESIA:  IV REGIONAL BIL FAB  . CHOLECYSTECTOMY    . COLONOSCOPY    . CORONARY ANGIOPLASTY  10/13/1996  . CORONARY ANGIOPLASTY  09/21/1989   emergency PTCA  . CORONARY ANGIOPLASTY  10/13/1996   Multi-Link diagonal & OD stenting (Dr. Marella Chimes)  . CORONARY ANGIOPLASTY  12/03/1997   disease of mid DX-1 ~50% & in mid PLA & PDA (distal lesions) (Dr. Marella Chimes)   . CORONARY ANGIOPLASTY  10/14/1999   progression of disease distal PLA & PDA; progression of disease prox RCA - moderate (Dr. Marella Chimes)   . CORONARY ANGIOPLASTY WITH STENT PLACEMENT  04/04/2004   4.0x57m non-DES (thrombectomy via AngioJet) to RCA for high grade stenosis (Dr. RMarella Chimes  . CORONARY ARTERY BYPASS GRAFT N/A 03/29/2015   Procedure: CORONARY ARTERY BYPASS GRAFTING TIMES TWO USING LEFT INTERNAL MAMMARY ARTERY AND RIGHT LEG GREATER SAPHENOUS VEIN HARVESTED ENDOSCOPICALLY.;  Surgeon: CRexene Alberts MD;  [facility]  Service: Open Heart Surgery;  Laterality: N/A;  . ESOPHAGOGASTRODUODENOSCOPY N/A 03/27/2015   Procedure: ESOPHAGOGASTRODUODENOSCOPY (EGD);  Surgeon: MClarene Essex MD;  [facility];  Service: Endoscopy;  Laterality: N/A;  possible dilation  . LEFT HEART CATHETERIZATION WITH CORONARY ANGIOGRAM N/A 03/28/2015   Procedure: LEFT  HEART CATHETERIZATION WITH CORONARY ANGIOGRAM;  Surgeon: Peter M JMartinique MD;  [facility];  Service: Cardiovascular;  Laterality: N/A;  . ROTATOR CUFF REPAIR Right   . SINUS ENDO W/FUSION    . TONSILLECTOMY    . TRANSTHORACIC ECHOCARDIOGRAM  08/08/2013   EF 55-60%, mild conc hypertrophy, grade 1 diastolic dysfunction; AV with mild stenosis; LA & RA mildly dilated    Current Medications: Outpatient Medications Prior to Visit  Medication Sig Dispense Refill  . allopurinol (ZYLOPRIM) 300 MG tablet Take 1 tablet by mouth daily. Take 1 tab by my mouth daily  2  . amitriptyline (ELAVIL) 50 MG tablet Take 50 mg by mouth daily.    .Marland Kitchen  amoxicillin-clavulanate (AUGMENTIN) 875-125 MG tablet Take 1 tablet by mouth 2 (two) times daily.    Marland Kitchen apixaban (ELIQUIS) 5 MG TABS tablet Take 1 tablet (5 mg total) by mouth 2 (two) times daily. 60 tablet 11  . aspirin 81 MG tablet Take 1 tablet (81 mg total) by mouth daily.    . Blood Glucose Monitoring Suppl (CONTOUR NEXT MONITOR) w/Device KIT 1 Device by Does not apply route 3 (three) times daily. Use to check blood sugars 3 times daily. Dx Code E13.9 1 kit 2  . colchicine 0.6 MG tablet Take 0.6 mg by mouth daily as needed.    . donepezil (ARICEPT) 10 MG tablet TAKE ONE TABLET DAILY.  PLEASE CALL [phone] TO SCHEDULE FOLLOW UP. 90 tablet 0  . furosemide (LASIX) 40 MG tablet Take 80 mg by mouth daily.    Marland Kitchen glucose blood (CONTOUR NEXT TEST) test strip USE TO CHECK GLUCOSE 4 TIMES DAILY. DX:E11.65 150 each 3  . Insulin Degludec (TRESIBA FLEXTOUCH) 200 UNIT/ML SOPN Inject 60 Units into the skin daily. 15 mL 1  . insulin lispro (HUMALOG) 100 UNIT/ML injection INJECT 25 TO 30 UNITS UNDER THE SKIN IN THE MORNING, 30 UNITS AT LUNCH AND 30 UNITS IN THE EVENING (Patient taking differently: No sig reported) 80 mL 2  . Insulin Pen Needle (EASY COMFORT PEN NEEDLES) 33G X 4 MM MISC 1 each by Does not apply route See admin instructions. Use to inject insulin 5 times  daily. 150 each 3  . levothyroxine (SYNTHROID, LEVOTHROID) 75 MCG tablet   1  . lisinopril (ZESTRIL) 10 MG tablet Take 1 tablet (10 mg total) by mouth daily. 90 tablet 2  . metFORMIN (GLUCOPHAGE-XR) 750 MG 24 hr tablet Take 1 tablet with dinner 90 tablet 2  . metoprolol succinate (TOPROL-XL) 25 MG 24 hr tablet Take 1 tablet (25 mg total) by mouth daily. 90 tablet 3  . NON FORMULARY Inhale 1 application into the lungs at bedtime. CPAP    . potassium chloride SA (K-DUR,KLOR-CON) 20 MEQ tablet Take 20 mEq by mouth 2 (two) times daily.     . rosuvastatin (CRESTOR) 20 MG tablet Take 20 mg by mouth daily.    . Tamsulosin HCl (FLOMAX) 0.4 MG CAPS Take 0.4 mg by mouth daily.     Marland Kitchen torsemide (DEMADEX) 10 MG tablet Take 73m by mouth alternating with 1057m    . Marland KitchenICTOZA 18 MG/3ML SOPN INJECT 1.2MG (0.46m26mINTO SKIN DAILY 6 mL 3   No facility-administered medications prior to visit.     Allergies:   Actos [pioglitazone], Metformin and related, and Niaspan [niacin er]   Social History   Socioeconomic History  . Marital status: Married    Spouse name: Not on file  . Number of children: 3  . Years of education: 12 50 Highest education level: Not on file  Occupational History  . Not on file  Tobacco Use  . Smoking status: Former Smoker    Packs/day: 2.00    Years: 20.00    Pack years: 40.00    Types: Cigarettes    Quit date: 01/25/1973    Years since quitting: 47.2  . Smokeless tobacco: Never Used  Substance and Sexual Activity  . Alcohol use: No    Alcohol/week: 0.0 standard drinks  . Drug use: No  . Sexual activity: Not on file  Other Topics Concern  . Not on file  Social History Narrative   Lives with wife.    Social Determinants of  Health   Financial Resource Strain:   . Difficulty of Paying Living Expenses:   Food Insecurity:   . Worried About Charity fundraiser in the Last Year:   . Arboriculturist in the Last Year:   Transportation Needs:   . Film/video editor  (Medical):   Marland Kitchen Lack of Transportation (Non-Medical):   Physical Activity:   . Days of Exercise per Week:   . Minutes of Exercise per Session:   Stress:   . Feeling of Stress :   Social Connections:   . Frequency of Communication with Friends and Family:   . Frequency of Social Gatherings with Friends and Family:   . Attends Religious Services:   . Active Member of Clubs or Organizations:   . Attends Archivist Meetings:   Marland Kitchen Marital Status:      Family History:  The patient's family history includes Cancer in his maternal grandmother; Coronary artery disease in his father; Heart Problems in his maternal grandfather; Heart disease in his mother.   ROS General: Negative; No fevers, chills, or night sweats;  HEENT: Negative; No changes in vision or hearing, sinus congestion, difficulty swallowing Pulmonary: Recent pneumonia Cardiovascular: Negative; No chest pain, presyncope, syncope, palpitations GI: Negative; No nausea, vomiting, diarrhea, or abdominal pain GU: Negative; No dysuria, hematuria, or difficulty voiding Musculoskeletal: Negative; no myalgias, joint pain, or weakness Hematologic/Oncology: Negative; no easy bruising, bleeding Endocrine: Negative; no heat/cold intolerance; no diabetes Neuro: History of CVA, peripheral neuropathy  Skin: Negative; No rashes or skin lesions Psychiatric: Negative; No behavioral problems, depression Sleep: See HPI  other comprehensive 14 point system review is negative.   PHYSICAL EXAM:   VS:  BP 130/62   Pulse 61   Ht '5\' 9"'  (1.753 m)   Wt 253 lb 9.6 oz (115 kg)   SpO2 96%   BMI 37.45 kg/m     Repeat blood pressure by me 114/70  Wt Readings from Last 3 Encounters:  03/27/20 253 lb 9.6 oz (115 kg)  03/13/20 254 lb (115.2 kg)  02/15/20 259 lb (117.5 kg)      Physical Exam BP 130/62   Pulse 61   Ht '5\' 9"'  (1.753 m)   Wt 253 lb 9.6 oz (115 kg)   SpO2 96%   BMI 37.45 kg/m  General: Alert, oriented, no distress.    Skin: normal turgor, no rashes, warm and dry HEENT: Normocephalic, atraumatic. Pupils equal round and reactive to light; sclera anicteric; extraocular muscles intact;  Nose without nasal septal hypertrophy; he has very small left nares compared to his right.  Positive for rhinophyma. Mouth/Parynx benign; Mallinpatti scale 3 Neck: No JVD, no carotid bruits; normal carotid upstroke Lungs: clear to ausculatation and percussion; no wheezing or rales Chest wall: without tenderness to palpitation Heart: PMI not displaced, RRR, s1 s2 normal, 2/6 systolic murmur, no diastolic murmur, no rubs, gallops, thrills, or heaves Abdomen: soft, nontender; no hepatosplenomehaly, BS+; abdominal aorta nontender and not dilated by palpation. Back: no CVA tenderness Pulses 2+ Musculoskeletal: full range of motion, normal strength, no joint deformities Extremities: 1+ edema left leg, trace right no clubbing cyanosis or edema, Homan's sign negative  Neurologic: grossly nonfocal; Cranial nerves grossly wnl Psychologic: Normal mood and affect   Studies/Labs Reviewed:   EKG:  EKG is not ordered today.  I personally reviewed the ECG from March 13, 2020 which showed atrial fibrillation at 63 bpm.  February 2020 ECG (independently read by me): Probable second-degree type I  Wenckebach block with PR prolongation to greater than 400 ms.  Nonspecific ST changes.  Regular rate approximately 55 bpm.  Recent Labs: BMP Latest Ref Rng & Units 02/12/2020 11/14/2019 08/29/2019  Glucose 70 - 99 mg/dL 142(H) 126(H) 150(H)  BUN 6 - 23 mg/dL '20 20 19  ' Creatinine 0.40 - 1.50 mg/dL 1.46 1.31 1.47  Sodium 135 - 145 mEq/L 143 141 141  Potassium 3.5 - 5.1 mEq/L 3.9 3.9 3.7  Chloride 96 - 112 mEq/L 103 103 103  CO2 19 - 32 mEq/L 32 31 32  Calcium 8.4 - 10.5 mg/dL 9.3 9.2 8.7     Hepatic Function Latest Ref Rng & Units 11/14/2019 05/23/2019 03/29/2015  Total Protein 6.0 - 8.3 g/dL 5.9(L) 6.2 5.3(L)  Albumin 3.5 - 5.2 g/dL 3.5 3.6 2.4(L)   AST 0 - 37 U/L '13 14 29  ' ALT 0 - 53 U/L '10 9 29  ' Alk Phosphatase 39 - 117 U/L 73 72 62  Total Bilirubin 0.2 - 1.2 mg/dL 0.6 0.7 0.7    CBC Latest Ref Rng & Units 04/05/2015 04/02/2015 04/01/2015  WBC 4.0 - 10.5 K/uL 13.0(H) 12.0(H) 16.3(H)  Hemoglobin 13.0 - 17.0 g/dL 10.7(L) 9.8(L) 9.6(L)  Hematocrit 39.0 - 52.0 % 33.3(L) 29.3(L) 28.7(L)  Platelets 150 - 400 K/uL 280 230 191   Lab Results  Component Value Date   MCV 90.2 04/05/2015   MCV 88.8 04/02/2015   MCV 89.1 04/01/2015   No results found for: TSH Lab Results  Component Value Date   HGBA1C 7.3 (H) 02/12/2020     BNP    Component Value Date/Time   BNP 733.8 (H) 03/23/2015 1628    ProBNP No results found for: PROBNP   Lipid Panel     Component Value Date/Time   CHOL 98 05/23/2019 0832   TRIG 83.0 05/23/2019 0832   HDL 38.40 (L) 05/23/2019 0832   CHOLHDL 3 05/23/2019 0832   VLDL 16.6 05/23/2019 0832   LDLCALC 43 05/23/2019 0832     RADIOLOGY: DG Chest 2 View  Result Date: 03/26/2020 CLINICAL DATA:  Pneumonia. EXAM: CHEST - 2 VIEW COMPARISON:  03/07/2020 FINDINGS: Lungs are adequately inflated demonstrate persistent and worsening bilateral multifocal consolidation suggesting multifocal infection. Possible small amount right pleural fluid. Cardiomediastinal silhouette and remainder the exam is unchanged. IMPRESSION: Persistent and worsening bilateral multifocal consolidation with possible small amount right pleural fluid. Recommend contrast enhanced chest CT for further evaluation to exclude underlying mass. Electronically Signed   By: Marin Olp M.D.   On: 03/26/2020 14:03   DG Chest 2 View  Result Date: 03/07/2020 CLINICAL DATA:  Chest pain and cough EXAM: CHEST - 2 VIEW COMPARISON:  June 30, 2018 FINDINGS: There is airspace consolidation in the right anterior perihilar region as well as in portions of the right lower lobe posteriorly and in the superior segment left lower lobe. There is a small left pleural  effusion. Heart size and pulmonary vascularity are normal. Patient is status post coronary artery bypass grafting. No adenopathy is appreciable by radiography. There is postoperative change in the lower cervical region. IMPRESSION: Multifocal airspace opacity consistent with multifocal pneumonia. Small right pleural effusion. Heart size normal.  Status post coronary artery bypass grafting. Followup PA and lateral chest radiographs recommended in 3-4 weeks following trial of antibiotic therapy to ensure resolution and exclude underlying malignancy. These results will be called to the ordering clinician or representative by the Radiologist Assistant, and communication documented in the PACS or [employer]. Electronically Signed  By: Lowella Grip III M.D.   On: 03/07/2020 13:04   CT CHEST W CONTRAST  Result Date: 03/27/2020 CLINICAL DATA:  Follow-up pneumonia EXAM: CT CHEST WITH CONTRAST TECHNIQUE: Multidetector CT imaging of the chest was performed during intravenous contrast administration. CONTRAST:  85m ISOVUE-300 IOPAMIDOL (ISOVUE-300) INJECTION 61% COMPARISON:  Chest x-ray 03/07/2020, 03/26/2020, 01/20/2007 FINDINGS: Cardiovascular: Prior CABG. Mild cardiomegaly. No pericardial effusion. Thoracic aorta is nonaneurysmal. Atherosclerotic calcifications of the aorta and coronary arteries. Main pulmonary trunk measures 3.3 cm in diameter. Mediastinum/Nodes: No axillary, mediastinal, or hilar lymphadenopathy. Right thyroid lobe nodule measuring 2.0 cm, stable compared to 01/20/2007. Stability for greater than 5 years implies benignity; no biopsy or followup indicated (ref: J Am Coll Radiol. 2015 Feb;12(2): 143-50). Trachea and esophagus appear within normal limits. Lungs/Pleura: Dense airspace consolidation within the posterior segment of the right upper lobe and involving the majority of the right lower lobe. Trace right pleural effusion. Additional wedge-shaped dense airspace consolidation within the  superior lingular segment of the left upper lobe. There is also a small focal opacity within the left infrahilar region (series 5, image 96) which could reflect an additional focus of pneumonia or possibly a reactive hilar lymph node. No pneumothorax. Upper Abdomen: No acute abnormality. Musculoskeletal: Median sternotomy and partially visualized lower cervical ACDF. No acute osseous findings. No suspicious osseous lesion. IMPRESSION: 1. Dense airspace consolidations within the right upper lobe, right lower lobe, and left upper lobe compatible with multifocal pneumonia. No findings are seen to suggest underlying malignancy. Continued radiographic follow-up is recommended to document resolution. 2. Trace right parapneumonic effusion. 3. There is also a small focal opacity within the left infrahilar region which could reflect an additional focus of pneumonia or possibly a reactive hilar lymph node. 4. Dilated main pulmonary trunk, can be seen with pulmonary arterial hypertension. 5. Aortic Atherosclerosis (ICD10-I70.0). Electronically Signed   By: NDavina PokeD.O.   On: 03/27/2020 12:27   ECHOCARDIOGRAM COMPLETE  Result Date: 03/22/2020    ECHOCARDIOGRAM REPORT   Patient Name:   Randall S JMartiniqueDate of Exam: 03/22/2020 Medical Rec #:  [id number]     Height:       69.0 in Accession #:    [id number]    Weight:       254.0 lb Date of Birth:  1Apr 18, 1942     BSA:          2.287 m Patient Age:    89years       BP:           142/70 mmHg Patient Gender: M              HR:           61 bpm. [facility], 3D Echo, Cardiac Doppler, Color Doppler and Strain Analysis Indications:    I48.91 Atrial fibrillation  History:        Patient has prior history of Echocardiogram examinations, most                 recent 04/01/2017. CHF, CAD and Previous Myocardial Infarction,                 Prior CABG, Stroke, Aortic Valve Disease, Arrythmias:PVC,                 Signs/Symptoms:Shortness of Breath  and Murmur; Risk                 Factors:Hypertension,  Sleep Apnea, Diabetes, Dyslipidemia,                 Obesity and Former Smoker. CKD stage 3. [place].  Sonographer:    Jessee Avers, RDCS Referring Phys: [place]  1. Left ventricular ejection fraction, by estimation, is 45 to 50%. Left ventricular ejection fraction by 3D volume is 48 %. The left ventricle has mildly decreased function. The left ventricle demonstrates global hypokinesis. There is mild left ventricular hypertrophy. Left ventricular diastolic parameters are indeterminate. The average left ventricular global longitudinal strain is -14.0 %.  2. Right ventricular systolic function is mildly reduced. The right ventricular size is mildly enlarged. There is mildly elevated pulmonary artery systolic pressure. The estimated right ventricular systolic pressure is 81.8 mmHg.  3. The mitral valve is normal in structure. No evidence of mitral valve regurgitation.  4. The aortic valve is abnormal. Moderately calcified. Aortic valve regurgitation is trivial. Mild to moderate aortic valve stenosis. Vmax 2.8 m/s, MG 17 mmHg, AVA 1.1 m/s, DI 0.35  5. The inferior vena cava is normal in size with greater than 50% respiratory variability, suggesting right atrial pressure of 3 mmHg. Comparison(s): 04/01/17 EF 55-60%. Mild AS 15mHg mean, 213mg peak. FINDINGS  Left Ventricle: Left ventricular ejection fraction, by estimation, is 45 to 50%. Left ventricular ejection fraction by 3D volume is 48 %. The left ventricle has mildly decreased function. The left ventricle demonstrates global hypokinesis. The average left ventricular global longitudinal strain is -14.0 %. The left ventricular internal cavity size was normal in size. There is mild left ventricular hypertrophy. Left ventricular diastolic parameters are indeterminate. Right Ventricle: The right ventricular size is mildly enlarged. Right vetricular wall thickness was not assessed. Right  ventricular systolic function is mildly reduced. There is mildly elevated pulmonary artery systolic pressure. The tricuspid regurgitant velocity is 3.02 m/s, and with an assumed right atrial pressure of 3 mmHg, the estimated right ventricular systolic pressure is 3956.3mHg. Left Atrium: Left atrial size was normal in size. Right Atrium: Right atrial size was normal in size. Pericardium: Trivial pericardial effusion is present. Mitral Valve: The mitral valve is normal in structure. No evidence of mitral valve regurgitation. Tricuspid Valve: The tricuspid valve is normal in structure. Tricuspid valve regurgitation is trivial. Aortic Valve: The aortic valve is abnormal. Aortic valve regurgitation is trivial. Mild to moderate aortic stenosis is present. There is moderate calcification of the aortic valve. Aortic valve mean gradient measures 17.0 mmHg. Aortic valve peak gradient  measures 29.7 mmHg. Aortic valve area, by VTI measures 1.09 cm. Pulmonic Valve: The pulmonic valve was not well visualized. Pulmonic valve regurgitation is not visualized. Aorta: The aortic root and ascending aorta are structurally normal, with no evidence of dilitation. Venous: The inferior vena cava is normal in size with greater than 50% respiratory variability, suggesting right atrial pressure of 3 mmHg. IAS/Shunts: The interatrial septum was not well visualized.  LEFT VENTRICLE PLAX 2D LVIDd:         5.30 cm LVIDs:         4.00 cm         2D LV PW:         1.10 cm         Longitudinal LV IVS:        1.00 cm         Strain LVOT diam:     2.00 cm         2D Strain GLS  -  13.2 % LV SV:         68              (A2C): LV SV Index:   30              2D Strain GLS  -15.7 % LVOT Area:     3.14 cm        (A3C):                                2D Strain GLS  -13.2 %                                (A4C):                                2D Strain GLS  -14.0 %                                Avg:                                 3D Volume EF                                 LV 3D EF:    Left                                             ventricular                                             ejection                                             fraction by                                             3D volume                                             is 48 %.                                 3D Volume EF:                                3D EF:        48 %  LV EDV:       113 ml                                LV ESV:       59 ml                                LV SV:        54 ml RIGHT VENTRICLE RV Basal diam:  3.60 cm RV S prime:     7.83 cm/s TAPSE (M-mode): 1.4 cm RVSP:           44.5 mmHg LEFT ATRIUM             Index       RIGHT ATRIUM           Index LA diam:        4.70 cm 2.06 cm/m  RA Pressure: 8.00 mmHg LA Vol (A2C):   48.4 ml 21.16 ml/m RA Area:     14.30 cm LA Vol (A4C):   38.8 ml 16.97 ml/m RA Volume:   37.00 ml  16.18 ml/m LA Biplane Vol: 46.1 ml 20.16 ml/m  AORTIC VALVE AV Area (Vmax):    1.09 cm AV Area (Vmean):   1.08 cm AV Area (VTI):     1.09 cm AV Vmax:           272.50 cm/s AV Vmean:          194.500 cm/s AV VTI:            0.624 m AV Peak Grad:      29.7 mmHg AV Mean Grad:      17.0 mmHg LVOT Vmax:         94.20 cm/s LVOT Vmean:        66.800 cm/s LVOT VTI:          0.217 m LVOT/AV VTI ratio: 0.35  AORTA Ao Root diam: 3.30 cm Ao Asc diam:  3.50 cm MITRAL VALVE                TRICUSPID VALVE                             TR Peak grad:   36.5 mmHg                             TR Vmax:        302.00 cm/s MV E velocity: 146.00 cm/s  Estimated RAP:  8.00 mmHg MV A velocity: 45.10 cm/s   RVSP:           44.5 mmHg MV E/A ratio:  3.24                             SHUNTS                             Systemic VTI:  0.22 m                             Systemic Diam: 2.00 cm Oswaldo Milian MD Electronically signed by Oswaldo Milian MD Signature Date/Time: 03/22/2020/3:37:29 PM  Final      Additional studies/ records  that were reviewed today include:   I reviewed the patient's sleep study from [place] dated July 02, 2005.  I have obtained a download from most recent CPAP use.   ASSESSMENT:    1. OSA (obstructive sleep apnea)   2. Essential hypertension   3. Aortic valve stenosis, etiology of cardiac valve disease unspecified   4. Longstanding persistent atrial fibrillation ([place])   5. Hx of CABG   6. Anticoagulation adequate     PLAN:  Randall Wilkins is a 79 year old patient who has significant cardiovascular comorbidities including CAD, left parietal stroke, status post CABG revascularization surgery, diabetes mellitus, mild to moderate aortic valve stenosis , atrial fibrillation on Eliquis anticoagulation and has been documented to have severe obstructive sleep apnea since his initial evaluation in 2006.  Apparently he was on BiPAP therapy but in 2017 received a new machine and he now has a ResMed AirSense 10 AutoSet CPAP unit rather than BiPAP.  He continues to be compliant on his most recent download with 100% usage and average usage at 9 hours and 12 minutes.  At a 14 cm pressure AHI is stable at 4.9 but I suspect this is elevated due to his mask leak.  On inspection of his nose he has a significantly reduced left nares.  He has been using an old Activa LT nasal mask.  With his nares disparity and his significant leak I have recommended he change his mask since he still prefers a nasal mask I have suggested the ResMed AirFit N 30i.  I am changing his CPAP to an auto mode with a range of 10 to 18 cm of water.  His most recent ECG shows atrial fibrillation which is now chronic and he is on Eliquis anticoagulation.  His blood pressure on repeat by me was stable.  He continues to be on furosemide 80 mg daily in addition to lisinopril 10 mg as well as Toprol-XL 25 mg daily.  He is diabetic on metformin Victoza and [place].  He continues to be on rosuvastatin for hyperlipidemia.  He will follow-up  with Dr. Debara Pickett.  I will see him in 1 year for reevaluation or sooner as needed.   I reviewed his most recent download.  He is 100% compliant.  AHI is 2.5 may be contributed by significant mask leak. He recently had difficulty with his mask breaking but he has received a similar mask which was his daughters and he continues to use a Mirage Activa LT medium size mask.  I reviewed with him potential new options for mask particularly the new ResMed airfit F 30i which he may be better for added comfort and this may be an option in the future.  He has issues with full edema and continues to take torsemide.  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  I have recommended  close follow-up with Dr. Debara Pickett in the next several weeks.  I will see him in 1 year for sleep reevaluation..   Medication Adjustments/Labs and Tests Ordered: Current medicines are reviewed at length with the patient today.  Concerns regarding medicines are outlined above.  Medication changes, Labs and Tests ordered today are listed in the Patient Instructions below. Patient Instructions  Medication Instructions:  CONTINUE WITH CURRENT MEDICATIONS. NO CHANGES.  *If you  need a refill on your cardiac medications before your next appointment, please call your pharmacy*   Follow-Up: At [facility], you and your health needs are our priority.  As part of our continuing mission to provide you with exceptional heart care, we have created designated Provider Care Teams.  These Care Teams include your primary Cardiologist (physician) and Advanced Practice Providers (APPs -  Physician Assistants and Nurse Practitioners) who all work together to provide you with the care you need, when you need it.  We recommend signing up for the patient portal called "MyChart".  Sign up information is provided on this  After Visit Summary.  MyChart is used to connect with patients for Virtual Visits (Telemedicine).  Patients are able to view lab/test results, encounter notes, upcoming appointments, etc.  Non-urgent messages can be sent to your provider as well.   To learn more about what you can do with MyChart, go to [email or web address].    Your next appointment:   12 month(s)  The format for your next appointment:   In Person  Provider:   Shelva Majestic, MD        Signed, Shelva Majestic, MD  04/03/2020 7:04 PM    [facility] [address] [place], [place],   [address] Phone: [phone]

## 2020-04-09 ENCOUNTER — Other Ambulatory Visit: Payer: Self-pay | Admitting: Endocrinology

## 2020-04-12 DIAGNOSIS — I10 Essential (primary) hypertension: Secondary | ICD-10-CM | POA: Diagnosis not present

## 2020-04-12 DIAGNOSIS — E039 Hypothyroidism, unspecified: Secondary | ICD-10-CM | POA: Diagnosis not present

## 2020-04-12 DIAGNOSIS — I4891 Unspecified atrial fibrillation: Secondary | ICD-10-CM | POA: Diagnosis not present

## 2020-04-12 DIAGNOSIS — E1129 Type 2 diabetes mellitus with other diabetic kidney complication: Secondary | ICD-10-CM | POA: Diagnosis not present

## 2020-04-19 NOTE — Progress Notes (Signed)
Office Visit Note  Patient: Randall Wilkins             Date of Birth: [birth year]           MRN: [id number]             PCP: [name], MD Referring: Meredith Pel, MD Visit Date: 04/29/2020 Occupation: @GUAROCC @  Mickel Baas- wife  Subjective:  Medication management.   History of Present Illness: Randall Wilkins is a 79 y.o. male seen in consultation per request of Dr. Marlou Sa.  He is accompanied by his wife Mickel Baas today.  According to them he was diagnosed with gout approximately 10 years ago by Dr. Charlestine Night and has been on allopurinol 300 mg a day for many years.  He has not had a flare of gout in many years.  He was followed at [facility] rheumatology before but due to the transportation issues he decided to come to this practice now.  According to his wife about 15 years ago while he was on a ventilator and in coma after recovering from it he started experiencing left lower extremity numbness and weakness.  He had rhabdomyolysis at the time.  He has been wearing a brace in his left lower extremity.  In the last 4 years he has been experiencing increased pain in his right knee joint for which she has been seeing Dr. Marlou Sa.  Dr. Marlou Sa has done cortisone and Visco supplement injections to his knee joints.  On [facility] record review I found his last Hyalgan injection was on August 18, 2018 and he had a right knee joint aspiration on June 14, 2019.  He has fallen twice in the last month and has been experiencing increased pain in his right knee.  He has numbness in his hands as well.  Activities of Daily Living:  Patient reports morning stiffness for 24 hours.   Patient Denies nocturnal pain.  Difficulty dressing/grooming: Reports Difficulty climbing stairs: Reports Difficulty getting out of chair: Reports Difficulty using hands for taps, buttons, cutlery, and/or writing: Reports  Review of Systems  Constitutional: Positive for fatigue. Negative for night sweats.  HENT: Positive for mouth  dryness. Negative for mouth sores and nose dryness.   Eyes: Negative for redness, itching and dryness.  Respiratory: Positive for shortness of breath. Negative for difficulty breathing.   Cardiovascular: Positive for swelling in legs/feet. Negative for chest pain, palpitations, hypertension and irregular heartbeat.  Gastrointestinal: Positive for diarrhea. Negative for blood in stool and constipation.  Endocrine: Negative for increased urination.  Genitourinary: Negative for difficulty urinating.  Musculoskeletal: Positive for arthralgias, gait problem, joint pain, joint swelling and morning stiffness. Negative for myalgias, muscle weakness, muscle tenderness and myalgias.  Skin: Negative for color change, rash, hair loss, nodules/bumps, redness, skin tightness, ulcers and sensitivity to sunlight.  Allergic/Immunologic: Negative for susceptible to infections.  Neurological: Positive for numbness, memory loss and weakness. Negative for dizziness, fainting, headaches and night sweats.  Hematological: Positive for bruising/bleeding tendency. Negative for swollen glands.  Psychiatric/Behavioral: Positive for confusion. Negative for depressed mood and sleep disturbance. The patient is not nervous/anxious.     PMFS History:  Patient Active Problem List   Diagnosis Date Noted  . Essential hypertension 03/23/2018  . Mild cognitive impairment 12/24/2017  . Gait abnormality 12/24/2017  . Murmur, cardiac 03/04/2017  . History of stroke 12/12/2015  . Gait disorder 12/12/2015  . Coronary artery disease involving native coronary artery of native heart without angina pectoris   . S/P CABG  x 2 03/29/2015  . Aortic valve sclerosis 03/29/2015  . Peptic ulcer with hemorrhage 03/28/2015  . Left main coronary artery disease 03/28/2015  . Chronic diastolic congestive heart failure ([place])   . Chronic kidney disease (CKD), stage III (moderate)   . Dysphagia   . AKI (acute kidney injury) ([place])   . Acute  respiratory failure with hypoxia ([place]) 03/25/2015  . NSTEMI (non-ST elevated myocardial infarction) ([place]) 03/25/2015  . Shortness of breath   . UTI (lower urinary tract infection)   . Subendocardial MI subsequent episode care ([place]) 03/24/2015  . SOB (shortness of breath)   . Confusion 03/21/2015  . Weakness generalized 03/21/2015  . Increased urinary frequency 03/21/2015  . Hyperglycemia 03/21/2015  . Thrombocytopenia ([place]) 03/21/2015  . UTI (urinary tract infection) 03/21/2015  . Fall 03/21/2015  . Hematoma of abdominal wall 03/21/2015  . Diarrhea 03/21/2015  . Obstructive sleep apnea 03/21/2015  . Acute renal failure ([place]) 03/21/2015  . Mild diastolic dysfunction 56/43/3295  . Dyspnea on exertion 03/21/2015  . Neuropathy 09/11/2014  . Bilateral leg edema 05/21/2014  . Diabetic peripheral neuropathy associated with type 1 diabetes mellitus ([place]) 11/14/2013  . Type 1 diabetes mellitus with nephropathy ([place]) 11/14/2013  . Aortic valve stenosis 11/14/2013  . Obesity (BMI 30-39.9) 11/14/2013  . Asymptomatic PVCs 11/14/2013  . Memory impairment 02/04/2013  . Hyperlipidemia 02/04/2013  . Diabetes 1.5, managed as type 1 ([place]) 02/04/2013  . Cerebral infarction ([place]) 02/04/2013  . CAD S/P percutaneous coronary angioplasty 11/14/2004    Past Medical History:  Diagnosis Date  . Aortic stenosis, mild 11/14/2013  . Aortic valve sclerosis 03/29/2015  . Bilateral leg edema 05/21/2014  . CAD (coronary artery disease)   . Chronic diastolic congestive heart failure ([place])   . Chronic kidney disease (CKD), stage III (moderate)   . 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 Diabetic peripheral neuropathy associated with type 1 diabetes mellitus ([place]) 11/14/2013  . Dysphagia   . Dyspnea on exertion 03/21/2015  . Exogenous obesity   . Gout   . Heart attack ([place])   . History of nuclear stress test 07/2011   dipyridamole; fixed inferolateral defect, worse at stress than rest; no reversible  ischemia; low risk scan   . Hyperlipidemia   . Hypertension   . Insulin dependent diabetes mellitus   . Left foot drop   . Left main coronary artery disease 03/28/2015  . Memory loss   . Obesity (BMI 30-39.9) 11/14/2013  . Obstructive sleep apnea 03/21/2015  . OSA on CPAP    uses a cpap  . Peptic ulcer with hemorrhage 03/28/2015  . Peripheral neuropathy   . Rhabdomyolysis   . 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 Stroke ([place])    L patietal with small scattered lacunar infarcts  . Thrombocytopenia ([place]) 03/21/2015  . Venous insufficiency   . Weakness generalized 03/21/2015    Family History  Problem Relation Age of Onset  . Heart disease Mother   . Coronary artery disease Father   . Cancer Maternal Grandmother   . Heart Problems Maternal Grandfather   . Diabetes Son        borderline    Past Surgical History:  Procedure Laterality Date  . BACK SURGERY  2002   lumbosacral. 11 back surgeries total  . Carotid Doppler  03/2013   bilat bulb/prox ICAs - mild amount of fibrous plaque with no evidence of  diameter reduction  . CARPAL TUNNEL RELEASE Bilateral 08/09/2014   Procedure: BILATERAL CARPAL TUNNEL RELEASE;  Surgeon: Daryll Brod, MD;  Location: [place];  Service: Orthopedics;  Laterality: Bilateral;  ANESTHESIA:  IV REGIONAL BIL FAB  . CHOLECYSTECTOMY    . COLONOSCOPY    . CORONARY ANGIOPLASTY  10/13/1996  . CORONARY ANGIOPLASTY  09/21/1989   emergency PTCA  . CORONARY ANGIOPLASTY  10/13/1996   Multi-Link diagonal & OD stenting (Dr. Marella Chimes)  . CORONARY ANGIOPLASTY  12/03/1997   disease of mid DX-1 ~50% & in mid PLA & PDA (distal lesions) (Dr. Marella Chimes)   . CORONARY ANGIOPLASTY  10/14/1999   progression of disease distal PLA & PDA; progression of disease prox RCA - moderate (Dr. Marella Chimes)   . CORONARY ANGIOPLASTY WITH STENT PLACEMENT  04/04/2004   4.0x40mm non-DES (thrombectomy via AngioJet) to RCA for high grade  stenosis (Dr. Marella Chimes)  . CORONARY ARTERY BYPASS GRAFT N/A 03/29/2015   Procedure: CORONARY ARTERY BYPASS GRAFTING TIMES TWO USING LEFT INTERNAL MAMMARY ARTERY AND RIGHT LEG GREATER SAPHENOUS VEIN HARVESTED ENDOSCOPICALLY.;  Surgeon: Rexene Alberts, MD;  Location: [facility];  Service: Open Heart Surgery;  Laterality: N/A;  . ESOPHAGOGASTRODUODENOSCOPY N/A 03/27/2015   Procedure: ESOPHAGOGASTRODUODENOSCOPY (EGD);  Surgeon: Clarene Essex, MD;  [facility] ENDOSCOPY;  Service: Endoscopy;  Laterality: N/A;  possible dilation  . LEFT HEART CATHETERIZATION WITH CORONARY ANGIOGRAM N/A 03/28/2015   Procedure: LEFT HEART CATHETERIZATION WITH CORONARY ANGIOGRAM;  Surgeon: Peter M Martinique, MD;  Location: [facility] CATH LAB;  Service: Cardiovascular;  Laterality: N/A;  . SINUS ENDO W/FUSION    . TONSILLECTOMY    . TRANSTHORACIC ECHOCARDIOGRAM  08/08/2013   EF 55-60%, mild conc hypertrophy, grade 1 diastolic dysfunction; AV with mild stenosis; LA & RA mildly dilated   Social History   Social History Narrative   Lives with wife.    Immunization History  Administered Date(s) Administered  . Influenza,inj,Quad PF,6+ Mos 11/14/2013  . Influenza-Unspecified 08/29/2018     Objective: Vital Signs: BP (!) 174/73 (BP Location: Right Arm, Patient Position: Sitting, Cuff Size: Normal)   Pulse (!) 58   Resp 17   Ht 5' 7.5" (1.715 m)   Wt 256 lb (116.1 kg)   BMI 39.50 kg/m    Physical Exam Vitals and nursing note reviewed.  Constitutional:      Appearance: He is well-developed.  HENT:     Head: Normocephalic and atraumatic.  Eyes:     Conjunctiva/sclera: Conjunctivae normal.     Pupils: Pupils are equal, round, and reactive to light.  Cardiovascular:     Rate and Rhythm: Normal rate and regular rhythm.     Heart sounds: Normal heart sounds.  Pulmonary:     Effort: Pulmonary effort is normal.     Breath sounds: Normal breath sounds.  Abdominal:     General: Bowel sounds are normal.     Palpations: Abdomen  is soft.  Musculoskeletal:     Cervical back: Normal range of motion and neck supple.  Skin:    General: Skin is warm and dry.     Capillary Refill: Capillary refill takes less than 2 seconds.  Neurological:     Mental Status: He is alert and oriented to person, place, and time.     Comments: Left lower extremity weakness and numbness  Psychiatric:        Behavior: Behavior normal.      Musculoskeletal Exam: Patient mobilizes with the help of a  walker.  He has limited range of motion of his cervical spine and lumbar spine.  Shoulder joints had abduction about 110 degrees.  Elbow joints with good range of motion.  He has some DIP and PIP thickening with no synovitis.  Hip joints were difficult to assess.  Knee joints were in full range of motion.  He has warmth on palpation of his right knee joint without any effusion.  There was no tenderness over ankles or MTPs.  CDAI Exam: CDAI Score: -- Patient Global: --; Provider Global: -- Swollen: --; Tender: -- Joint Exam 04/29/2020   No joint exam has been documented for this visit   There is currently no information documented on the homunculus. Go to the Rheumatology activity and complete the homunculus joint exam.  Investigation: No additional findings.  Imaging: No results found.  Recent Labs: Lab Results  Component Value Date   WBC 13.0 (H) 04/05/2015   HGB 10.7 (L) 04/05/2015   PLT 280 04/05/2015   NA 143 02/12/2020   K 3.9 02/12/2020   CL 103 02/12/2020   CO2 32 02/12/2020   GLUCOSE 142 (H) 02/12/2020   BUN 20 02/12/2020   CREATININE 1.46 02/12/2020   BILITOT 0.6 11/14/2019   ALKPHOS 73 11/14/2019   AST 13 11/14/2019   ALT 10 11/14/2019   PROT 5.9 (L) 11/14/2019   ALBUMIN 3.5 11/14/2019   CALCIUM 9.3 02/12/2020   GFRAA 36 (L) 04/05/2015    Speciality Comments: No specialty comments available.  Procedures:  No procedures performed Allergies: Actos [pioglitazone], Metformin and related, and Niaspan [niacin er]    Assessment / Plan:     Visit Diagnoses: Idiopathic chronic gout of multiple sites without tophus -patient has history of gout for at least 10 years.  He was initially treated by Dr. Charlestine Night and then was under care of [facility] rheumatology.  He has been on allopurinol 300 mg p.o. daily.  He does not recall having a gout flare in many years.  The allopurinol was recently filled by his PCP.  Plan: Uric acid  Medication management - Plan: CBC with Differential/Platelet, COMPLETE METABOLIC PANEL WITH GFR today.  There is mention of thrombocytopenia in the chart but I do not have any recent CBC on him.  Primary osteoarthritis of right knee -he had warmth on palpation of his right knee joint.  I also found that Dr. Marlou Sa had aspirated his right knee joint in the past.  I do not see synovial fluid analysis.  He had good response to Visco supplement injections in the past.  Have advised him to schedule any other appointment with Dr. Marlou Sa for repeat Visco supplement injections.  He has been followed by Dr. Marlou Sa - Plan: XR KNEE 3 VIEW RIGHT, x-ray today revealed severe end-stage osteoarthritis and severe chondromalacia patella.  Rheumatoid factor, Cyclic citrul peptide antibody, IgG  DDD (degenerative disc disease), cervical -he has limited range of motion of his cervical spine.  Status post fusion by Dr. Ellene Route  DDD (degenerative disc disease), lumbar -he has noted range of motion of his lumbar spine and chronic pain.  Status post fusion by Dr. Ellene Route  Other medical problems are listed as follows:  Aortic valve sclerosis  Asymptomatic PVCs  Essential hypertension  Chronic diastolic congestive heart failure (HCC) - Followed by Dr. Debara Pickett  Coronary artery disease involving native coronary artery of native heart without angina pectoris  NSTEMI (non-ST elevated myocardial infarction) ([place])  Peptic ulcer with hemorrhage - Followed by Dr. Watt Climes.  Diabetic  peripheral neuropathy associated with type 1  diabetes mellitus (HCC)  Neuropathy  Stage 3 chronic kidney disease, unspecified whether stage 3a or 3b CKD  Thrombocytopenia (HCC)  Obstructive sleep apnea  Memory impairment    Orders: Orders Placed This Encounter  Procedures  . XR KNEE 3 VIEW RIGHT  . CBC with Differential/Platelet  . COMPLETE METABOLIC PANEL WITH GFR  . Uric acid  . Rheumatoid factor  . Cyclic citrul peptide antibody, IgG   No orders of the defined types were placed in this encounter.   Face-to-face time spent with patient was 45 minutes. Greater than 50% of time was spent in counseling and coordination of care.  Follow-Up Instructions: Return for Gout.   Bo Merino, MD  Note - This record has been created using Editor, commissioning.  Chart creation errors have been sought, but may not always  have been located. Such creation errors do not reflect on  the standard of medical care.

## 2020-04-29 ENCOUNTER — Encounter: Payer: Self-pay | Admitting: Rheumatology

## 2020-04-29 ENCOUNTER — Ambulatory Visit: Payer: Self-pay

## 2020-04-29 ENCOUNTER — Other Ambulatory Visit: Payer: Self-pay

## 2020-04-29 ENCOUNTER — Ambulatory Visit
Admission: RE | Admit: 2020-04-29 | Discharge: 2020-04-29 | Disposition: A | Discharge status: Home / Self Care | Payer: Medicare Other | Source: Ambulatory Visit | Attending: Family Medicine | Admitting: Family Medicine

## 2020-04-29 ENCOUNTER — Other Ambulatory Visit: Payer: Self-pay | Admitting: Family Medicine

## 2020-04-29 ENCOUNTER — Ambulatory Visit: Payer: Medicare Other | Admitting: Rheumatology

## 2020-04-29 VITALS — BP 174/73 | HR 58 | Resp 17 | Ht 67.5 in | Wt 256.0 lb

## 2020-04-29 DIAGNOSIS — I214 Non-ST elevation (NSTEMI) myocardial infarction: Secondary | ICD-10-CM

## 2020-04-29 DIAGNOSIS — J189 Pneumonia, unspecified organism: Secondary | ICD-10-CM | POA: Diagnosis not present

## 2020-04-29 DIAGNOSIS — M5136 Other intervertebral disc degeneration, lumbar region: Secondary | ICD-10-CM | POA: Diagnosis not present

## 2020-04-29 DIAGNOSIS — Z79899 Other long term (current) drug therapy: Secondary | ICD-10-CM

## 2020-04-29 DIAGNOSIS — K274 Chronic or unspecified peptic ulcer, site unspecified, with hemorrhage: Secondary | ICD-10-CM

## 2020-04-29 DIAGNOSIS — M1A09X0 Idiopathic chronic gout, multiple sites, without tophus (tophi): Secondary | ICD-10-CM | POA: Diagnosis not present

## 2020-04-29 DIAGNOSIS — R413 Other amnesia: Secondary | ICD-10-CM

## 2020-04-29 DIAGNOSIS — I493 Ventricular premature depolarization: Secondary | ICD-10-CM

## 2020-04-29 DIAGNOSIS — N183 Chronic kidney disease, stage 3 unspecified: Secondary | ICD-10-CM

## 2020-04-29 DIAGNOSIS — I10 Essential (primary) hypertension: Secondary | ICD-10-CM

## 2020-04-29 DIAGNOSIS — M51369 Other intervertebral disc degeneration, lumbar region without mention of lumbar back pain or lower extremity pain: Secondary | ICD-10-CM

## 2020-04-29 DIAGNOSIS — G4733 Obstructive sleep apnea (adult) (pediatric): Secondary | ICD-10-CM

## 2020-04-29 DIAGNOSIS — D696 Thrombocytopenia, unspecified: Secondary | ICD-10-CM

## 2020-04-29 DIAGNOSIS — M1711 Unilateral primary osteoarthritis, right knee: Secondary | ICD-10-CM

## 2020-04-29 DIAGNOSIS — E1042 Type 1 diabetes mellitus with diabetic polyneuropathy: Secondary | ICD-10-CM

## 2020-04-29 DIAGNOSIS — Z9861 Coronary angioplasty status: Secondary | ICD-10-CM

## 2020-04-29 DIAGNOSIS — I2510 Atherosclerotic heart disease of native coronary artery without angina pectoris: Secondary | ICD-10-CM

## 2020-04-29 DIAGNOSIS — I5032 Chronic diastolic (congestive) heart failure: Secondary | ICD-10-CM

## 2020-04-29 DIAGNOSIS — M5030 Other cervical disc degeneration, unspecified cervical region: Secondary | ICD-10-CM | POA: Diagnosis not present

## 2020-04-29 DIAGNOSIS — I358 Other nonrheumatic aortic valve disorders: Secondary | ICD-10-CM

## 2020-04-29 DIAGNOSIS — E139 Other specified diabetes mellitus without complications: Secondary | ICD-10-CM

## 2020-04-29 DIAGNOSIS — G629 Polyneuropathy, unspecified: Secondary | ICD-10-CM

## 2020-04-30 ENCOUNTER — Other Ambulatory Visit: Payer: Self-pay | Admitting: Endocrinology

## 2020-04-30 DIAGNOSIS — M1711 Unilateral primary osteoarthritis, right knee: Secondary | ICD-10-CM | POA: Diagnosis not present

## 2020-04-30 DIAGNOSIS — Z79899 Other long term (current) drug therapy: Secondary | ICD-10-CM | POA: Diagnosis not present

## 2020-04-30 DIAGNOSIS — M1A09X0 Idiopathic chronic gout, multiple sites, without tophus (tophi): Secondary | ICD-10-CM | POA: Diagnosis not present

## 2020-05-01 LAB — CBC WITH DIFFERENTIAL/PLATELET
Absolute Monocytes: 832 cells/uL (ref 200–950)
Basophils Absolute: 44 cells/uL (ref 0–200)
Basophils Relative: 0.6 %
Eosinophils Absolute: 73 cells/uL (ref 15–500)
Eosinophils Relative: 1 %
HCT: 46.2 % (ref 38.5–50.0)
Hemoglobin: 15 g/dL (ref 13.2–17.1)
Lymphs Abs: 1372 cells/uL (ref 850–3900)
MCH: 29.8 pg (ref 27.0–33.0)
MCHC: 32.5 g/dL (ref 32.0–36.0)
MCV: 91.7 fL (ref 80.0–100.0)
MPV: 12.1 fL (ref 7.5–12.5)
Monocytes Relative: 11.4 %
Neutro Abs: 4979 cells/uL (ref 1500–7800)
Neutrophils Relative %: 68.2 %
Platelets: 151 10*3/uL (ref 140–400)
RBC: 5.04 10*6/uL (ref 4.20–5.80)
RDW: 14.5 % (ref 11.0–15.0)
Total Lymphocyte: 18.8 %
WBC: 7.3 10*3/uL (ref 3.8–10.8)

## 2020-05-01 LAB — COMPLETE METABOLIC PANEL WITH GFR
AG Ratio: 1.6 (calc) (ref 1.0–2.5)
ALT: 7 U/L — ABNORMAL LOW (ref 9–46)
AST: 13 U/L (ref 10–35)
Albumin: 3.6 g/dL (ref 3.6–5.1)
Alkaline phosphatase (APISO): 71 U/L (ref 35–144)
BUN/Creatinine Ratio: 15 (calc) (ref 6–22)
BUN: 20 mg/dL (ref 7–25)
CO2: 28 mmol/L (ref 20–32)
Calcium: 9.2 mg/dL (ref 8.6–10.3)
Chloride: 104 mmol/L (ref 98–110)
Creat: 1.3 mg/dL — ABNORMAL HIGH (ref 0.70–1.18)
GFR, Est African American: 60 mL/min/{1.73_m2} (ref >=60)
GFR, Est Non African American: 52 mL/min/{1.73_m2} — ABNORMAL LOW (ref >=60)
Globulin: 2.3 g/dL (calc) (ref 1.9–3.7)
Glucose, Bld: 81 mg/dL (ref 65–99)
Potassium: 4 mmol/L (ref 3.5–5.3)
Sodium: 143 mmol/L (ref 135–146)
Total Bilirubin: 0.7 mg/dL (ref 0.2–1.2)
Total Protein: 5.9 g/dL — ABNORMAL LOW (ref 6.1–8.1)

## 2020-05-01 LAB — RHEUMATOID FACTOR: Rheumatoid fact SerPl-aCnc: <14 IU/mL (ref <14)

## 2020-05-01 LAB — URIC ACID: Uric Acid, Serum: 3.7 mg/dL — ABNORMAL LOW (ref 4.0–8.0)

## 2020-05-01 LAB — CYCLIC CITRUL PEPTIDE ANTIBODY, IGG: Cyclic Citrullin Peptide Ab: <16 UNITS

## 2020-05-01 NOTE — Progress Notes (Signed)
GFR is low but is stable.  Uric acid is in desirable range.

## 2020-05-03 ENCOUNTER — Ambulatory Visit: Payer: Medicare Other | Admitting: Internal Medicine

## 2020-05-03 ENCOUNTER — Encounter: Payer: Self-pay | Admitting: Internal Medicine

## 2020-05-03 ENCOUNTER — Other Ambulatory Visit: Payer: Self-pay

## 2020-05-03 VITALS — BP 145/70 | HR 60 | Ht 69.0 in | Wt 249.0 lb

## 2020-05-03 DIAGNOSIS — I4811 Longstanding persistent atrial fibrillation: Secondary | ICD-10-CM | POA: Diagnosis not present

## 2020-05-03 DIAGNOSIS — Z951 Presence of aortocoronary bypass graft: Secondary | ICD-10-CM | POA: Diagnosis not present

## 2020-05-03 DIAGNOSIS — J189 Pneumonia, unspecified organism: Secondary | ICD-10-CM

## 2020-05-03 NOTE — Progress Notes (Signed)
OFFICE NOTE  Chief Complaint:  Follow-up A. fib  Primary Care Physician: Randall Arabian, MD  HPI:  Randall Wilkins is a pleasant 79 year old male patient who was formerly followed by Dr. Rollene Wilkins. He is here to establish cardiac care with [place] today. He has a history of obesity, multiple comorbidities including insulin-dependent diabetes, diabetic peripheral neuropathy, diabetic nephropathy, venous insufficiency and peripheral neuropathy. He also has obstructive sleep apnea on CPAP and coronary artery disease. He unfortunately suffered a left parietal stroke with small lacunar infarcts noted on MRI in 2013. He is a left foot drop from an L4/L5 neuropathy and wears a brace for this. From a cardiac standpoint he had a large non-drug-eluting stent placed in the RCA in 2005. He has had a stent placed at 89 to a diagonal which was noted to be patent. He's had some problems with dizziness which is improved. He [place] as large to medium secondary to neuropathy and venous insufficiency. Unfortunately not been able to lose a lot of weight due to his difficulty in ambulating and painful neuropathy.  He denies any cardiac chest pain or worsening shortness of breath.  [name] for a six-month followup. He is having no complaints. He does report some lower extremity swelling which is stable. He has mild aortic stenosis which is been stable as well. His last echo was in August 2014. He denies any chest pain or worsening shortness of breath. He recently had lab work to Dr. Eugenio Wilkins office which we will obtain.  I saw [name] today for hospital follow-up. He is reportedly doing much better. He has more energy and no significant chest discomfort. Between our last appointment see presented with a urinary tract infection and ultimately had chest pain and profuse diaphoresis. He was found to have elevated troponin and underwent cardiac catheterization. This demonstrated the following:  Coronary  angiography: Coronary dominance: right  Left mainstem: 95% distal left main stenosis.  Left anterior descending (LAD): There is 95% ostial LAD stenosis. The LAD is a large vessel. The LAD and diagonal otherwise are without significant disease.  There is a large ramus intermediate branch with 99% ostial stenosis.  Left circumflex (LCx): 100% ostial occlusion with faint left to left collaterals.  Right coronary artery (RCA): the RCA is a large dominant branch. The stent in the proximal vessel is widely patent. There is a 60% stenosis in the mid PDA. The third PLOM is moderate in size with a long 90-95% stenosis prior to distal arborization.   Subsequently he underwent emergent coronary artery bypass grafting with a LIMA to LAD and SVG to OM branch. He is noted to have mild aortic sclerosis versus stenosis, however the valve was not severe enough to be addressed. Since discharge he is more active and is starting cardiac rehabilitation today.  [name] for follow-up. Overall he is feeling well and continues to improve in his ambulation and stamina. He denies any chest pain or shortness of breath. He is rehabilitating well. Recent laboratory work shows excellent cholesterol control and total cholesterol 130, HDL 42 LDL 61 and triglycerides 137. His only concern is the cost of medications.  I saw [name] today in the office. He denies any chest pain or worsening shortness of breath. Unfortunately he's had about several weeks of diarrhea which started when he went to the [facility] area. It was not felt to be infectious any seeing a gastroenterologist about this. Otherwise he is without  cardiac complaints. Blood pressure today was low at 114/52. He's managed to lose only a couple of pounds but generally his appetite stools been pretty good despite the diarrhea. He is planning on having an upcoming injection and I received a request to hold his Plavix for 5 days prior to that. He  is currently holding it will have his injection on Monday. Based on this I reviewed his records and I do not see a clear ongoing indication for the Plavix in addition to aspirin. Now that he's been surgically revascularized.   03/04/2017  [name] for follow-up. Recently he's had some dizziness and apparently had lower blood pressure. He backed off on his lisinopril to 2.5 mg daily, or at least he says he is taking a half tablet. Will try to figure out what the dose was. EKG today shows sinus bradycardia with either a first degree AV block of great significance or possibly atrial fibrillation. It's difficult to see consistent P waves with normal PP intervals. The RR intervals are slightly irregular. He has had sinus with a long first-degree AV block of 340 ms in the past. This is important to determine as he has had prior stroke and would necessitate anticoagulation.  03/23/2018  [name] for follow-up.  He has no specific complaints.  He continues to have a little instability with his gait.  He is using a cane today but has a walker at home.  Recently he has had the best blood sugars that he has had in a while.  He started seeing Randall Wilkins, who added Victoza.  He is also on [place] and Humalog.  He was in a clinical trial with [employer], and is to be considered given his history of coronary disease and diabetes for mortality benefit.  I will defer to Randall Wilkins.  Blood pressure is well controlled today.  Weight remains excessive.  EKG personally reviewed shows sinus rhythm with PVCs for which he is unaware of.  02/07/2019  Randall Wilkins returns today for follow-up.  He is done well and has no new complaints.  Recently seen by Randall Wilkins and adjustments were made to his CPAP.  He is reportedly 100% compliant.  He was noted to have a type I winky block on EKG a couple weeks ago and his Toprol-XL was reduced from 37.5 to 25 mg.  He seems to be tolerating this however blood pressures  accordingly gone up.  Blood pressure today was 155/71 and home blood pressures are in the 150s.  03/13/2020  Randall Wilkins is seen today in follow-up.  He is accompanied by his wife.  He scheduled to see Randall Wilkins in a couple weeks.  Recently he has been struggling with shortness of breath and fatigue.  He saw his primary care provider and diagnosed with a pneumonia.  He has been on antibiotics and does seem to be improving.  He has somewhat of a productive cough.  An EKG was performed today and shows atrial fibrillation controlled ventricular response.  This is newly documented.  He does not have known history of this although had had a prior stroke.  He is on low-dose aspirin.  05/03/2020  Randall Wilkins is seen today in follow-up.  [place] from the beach.  He remains in A. fib.  Fortunately his pneumonia is not resolving.  Repeat imaging still suggest multifocal infiltrates and his PCP apparently is going to refer him to pulmonary.  I suspect he may end up getting bronchoscopy  or other procedures.  This will complicate things if he were to have a cardioversion, particularly I would not want to stop his anticoagulation to allow for those procedures within a month after it.  At this point he remains in A. fib today but is rate controlled.  He has been fatigued again I think this is related his A. fib and his pneumonias.  He is essentially nonambulatory and difficult for his wife to push around.  They have asked about a powered wheelchair.  PMHx:  Past Medical History:  Diagnosis Date  . Aortic stenosis, mild 11/14/2013  . Aortic valve sclerosis 03/29/2015  . Bilateral leg edema 05/21/2014  . CAD (coronary artery disease)   . Chronic diastolic congestive heart failure ([place])   . Chronic kidney disease (CKD), stage III (moderate)   . Diabetes 1.5, managed as type 1 ([place]) 02/04/2013  . Diabetic peripheral neuropathy associated with type 1 diabetes mellitus ([place]) 11/14/2013  . Dysphagia   . Dyspnea on exertion  03/21/2015  . Exogenous obesity   . Gout   . Heart attack ([place])   . History of nuclear stress test 07/2011   dipyridamole; fixed inferolateral defect, worse at stress than rest; no reversible ischemia; low risk scan   . Hyperlipidemia   . Hypertension   . Insulin dependent diabetes mellitus   . Left foot drop   . Left main coronary artery disease 03/28/2015  . Memory loss   . Obesity (BMI 30-39.9) 11/14/2013  . Obstructive sleep apnea 03/21/2015  . OSA on CPAP    uses a cpap  . Peptic ulcer with hemorrhage 03/28/2015  . Peripheral neuropathy   . Rhabdomyolysis   . 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 Stroke ([place])    L patietal with small scattered lacunar infarcts  . Thrombocytopenia ([place]) 03/21/2015  . Venous insufficiency   . Weakness generalized 03/21/2015    Past Surgical History:  Procedure Laterality Date  . BACK SURGERY  2002   lumbosacral. 11 back surgeries total  . Carotid Doppler  03/2013   bilat bulb/prox ICAs - mild amount of fibrous plaque with no evidence of diameter reduction  . CARPAL TUNNEL RELEASE Bilateral 08/09/2014   Procedure: BILATERAL CARPAL TUNNEL RELEASE;  Surgeon: Daryll Brod, MD;  Location: [facility];  Service: Orthopedics;  Laterality: Bilateral;  ANESTHESIA:  IV REGIONAL BIL FAB  . CHOLECYSTECTOMY    . COLONOSCOPY    . CORONARY ANGIOPLASTY  10/13/1996  . CORONARY ANGIOPLASTY  09/21/1989   emergency PTCA  . CORONARY ANGIOPLASTY  10/13/1996   Multi-Link diagonal & OD stenting (Dr. Marella Chimes)  . CORONARY ANGIOPLASTY  12/03/1997   disease of mid DX-1 ~50% & in mid PLA & PDA (distal lesions) (Dr. Marella Chimes)   . CORONARY ANGIOPLASTY  10/14/1999   progression of disease distal PLA & PDA; progression of disease prox RCA - moderate (Dr. Marella Chimes)   . CORONARY ANGIOPLASTY WITH STENT PLACEMENT  04/04/2004   4.0x57m non-DES (thrombectomy via AngioJet) to RCA for high grade stenosis (Dr. RMarella Chimes    . CORONARY ARTERY BYPASS GRAFT N/A 03/29/2015   Procedure: CORONARY ARTERY BYPASS GRAFTING TIMES TWO USING LEFT INTERNAL MAMMARY ARTERY AND RIGHT LEG GREATER SAPHENOUS VEIN HARVESTED ENDOSCOPICALLY.;  Surgeon: CRexene Alberts MD;  [facility]  Service: Open Heart Surgery;  Laterality: N/A;  . ESOPHAGOGASTRODUODENOSCOPY N/A 03/27/2015   Procedure: ESOPHAGOGASTRODUODENOSCOPY (EGD);  Surgeon: MAltamese Dilling  Magod, MD;  Location: [facility];  Service: Endoscopy;  Laterality: N/A;  possible dilation  . LEFT HEART CATHETERIZATION WITH CORONARY ANGIOGRAM N/A 03/28/2015   Procedure: LEFT HEART CATHETERIZATION WITH CORONARY ANGIOGRAM;  Surgeon: Peter M Martinique, MD;  Location: [facility] C[facility] LAB;  Service: Cardiovascular;  Laterality: N/A;  . SINUS ENDO W/FUSION    . TONSILLECTOMY    . TRANSTHORACIC ECHOCARDIOGRAM  08/08/2013   EF 55-60%, mild conc hypertrophy, grade 1 diastolic dysfunction; AV with mild stenosis; LA & RA mildly dilated    FAMHx:  Family History  Problem Relation Age of Onset  . Heart disease Mother   . Coronary artery disease Father   . Cancer Maternal Grandmother   . Heart Problems Maternal Grandfather   . Diabetes Son        borderline     SOCHx:   reports that he quit smoking about 47 years ago. His smoking use included cigarettes. He has a 40.00 pack-year smoking history. He has never used smokeless tobacco. He reports that he does not drink alcohol or use drugs.  ALLERGIES:  Allergies  Allergen Reactions  . Actos [Pioglitazone] Swelling  . Metformin And Related Nausea Only  . Niaspan [Niacin Er] Itching and Rash    ROS: Pertinent items noted in HPI and remainder of comprehensive ROS otherwise negative.  HOME MEDS: Current Outpatient Medications  Medication Sig Dispense Refill  . allopurinol (ZYLOPRIM) 300 MG tablet Take 1 tablet by mouth daily. Take 1 tab by my mouth daily  2  . amitriptyline (ELAVIL) 50 MG tablet Take 50 mg by mouth daily.    Marland Kitchen apixaban (ELIQUIS) 5 MG TABS  tablet Take 1 tablet (5 mg total) by mouth 2 (two) times daily. 60 tablet 11  . aspirin 81 MG tablet Take 1 tablet (81 mg total) by mouth daily.    . Blood Glucose Monitoring Suppl (CONTOUR NEXT MONITOR) w/Device KIT 1 Device by Does not apply route 3 (three) times daily. Use to check blood sugars 3 times daily. Dx Code E13.9 1 kit 2  . colchicine 0.6 MG tablet Take 0.6 mg by mouth daily as needed.    . donepezil (ARICEPT) 10 MG tablet TAKE ONE TABLET DAILY.  PLEASE CALL [phone] TO SCHEDULE FOLLOW UP. 90 tablet 0  . furosemide (LASIX) 40 MG tablet Take 80 mg by mouth daily.    Marland Kitchen glucose blood (CONTOUR NEXT TEST) test strip USE TO CHECK GLUCOSE 4 TIMES DAILY. DX:E11.65 150 each 3  . insulin lispro (HUMALOG) 100 UNIT/ML injection INJECT 25 TO 30 UNITS UNDER THE SKIN IN THE MORNING, 30 UNITS AT LUNCH AND 30 UNITS IN THE EVENING (Patient taking differently: No sig reported) 80 mL 2  . Insulin Pen Needle (EASY COMFORT PEN NEEDLES) 33G X 4 MM MISC 1 each by Does not apply route See admin instructions. Use to inject insulin 5 times daily. 150 each 3  . levothyroxine (SYNTHROID, LEVOTHROID) 75 MCG tablet   1  . lisinopril (ZESTRIL) 10 MG tablet Take 1 tablet (10 mg total) by mouth daily. 90 tablet 2  . metFORMIN (GLUCOPHAGE-XR) 750 MG 24 hr tablet TAKE 1 TABLET WITH DINNER 90 tablet 0  . metoprolol succinate (TOPROL-XL) 25 MG 24 hr tablet Take 1 tablet (25 mg total) by mouth daily. 90 tablet 3  . NON FORMULARY Inhale 1 application into the lungs at bedtime. CPAP    . potassium chloride SA (K-DUR,KLOR-CON) 20 MEQ tablet Take 20 mEq by mouth 2 (two) times daily.     Marland Kitchen  rosuvastatin (CRESTOR) 20 MG tablet Take 20 mg by mouth daily.    . Tamsulosin HCl (FLOMAX) 0.4 MG CAPS Take 0.4 mg by mouth daily.     Marland Kitchen torsemide (DEMADEX) 10 MG tablet Take '50mg'$  by mouth alternating with '100mg'$ .    . TRESIBA FLEXTOUCH 200 UNIT/ML FlexTouch Pen INJECT 60 UNITS INTO SKIN DAILY 15 mL 1  . VICTOZA 18 MG/3ML SOPN INJECT  1.'2MG'$  (0.85m) INTO SKIN DAILY 6 mL 3   No current facility-administered medications for this visit.    LABS/IMAGING: No results found for this or any previous visit (from the past 48 hour(s)). No results found.  VITALS: BP (!) 145/70   Pulse 60   Ht '5\' 9"'$  (1.753 m)   Wt 249 lb (112.9 kg)   SpO2 96%   BMI 36.77 kg/m   EXAM: General appearance: alert and no distress Neck: no carotid bruit and no JVD Lungs: dullness to percussion RLL and rhonchi RLL Heart: regular rate and rhythm, S1, S2 normal, systolic murmur: 3/6 early to mid-peaking systolic murmur 3/6, crescendo at 2nd right intercostal space and No rub Abdomen: obese, soft, non-tender, +BS Extremities: edema trace Pulses: 2+ and symmetric Skin: Skin color, texture, turgor normal. No rashes or lesions Neurologic: Mental status: Alert, oriented, thought content appropriate, does have memory issues Psych: Pleasant  EKG: A. fib at 60-personally reviewed  ASSESSMENT: 1. New onset atrial fibrillation - CHADSVASC score of 6 2. Recent multifocal pneumonia 3. Coronary artery disease status post BMS to the RCA in 2005, recent CABG 2 (LIMA to LAD - for critical left main disease, SVG to OM1)- 2016 4. Obesity 5. Hypertension-controlled 6. Dyslipidemia 7. Prior stroke 8. Diabetic neuropathy 9. Diabetic nephropathy - left foot drop 10. Type 1 diabetes on insulin 11. LE edema 12. Mild aortic stenosis  PLAN: 1.   Mr. JMartiniquecontinues to struggle with what appears to be a multifocal pneumonia.  A repeat chest x-ray shows no significant resolution.  Apparently he will be referred to pulmonary.  I suspect that may want do further work-up including possible bronchoscopy.  He is in rate controlled A. fib which is persistent and I think is symptomatic with it.  He reports significant fatigue and is hard to know if this is the A. fib, pneumonia or likely combination of both.  He is appropriately anticoagulated.  I would recommend  continuing current therapies.  I would like to see if we could get resolution of his pneumonia and or those procedures out of the way first before we consider a cardioversion.  Plan follow-up with me in 2 months.  From my standpoint, would be okay to hold Eliquis for up to 2 days prior to a procedure if necessary and then restart afterwards.  KPixie Casino MD, FPark[facility] FCambriaDirector of the Advanced Lipid Disorders &  Cardiovascular Risk Reduction Clinic Diplomate of the American Board of Clinical Lipidology Attending Cardiologist  Direct Dial: [phone] Fax: [phone] Website:  [email or web address] 05/03/2020, 11:19 AM

## 2020-05-03 NOTE — Patient Instructions (Signed)
Medication Instructions:  Your physician recommends that you continue on your current medications as directed. Please refer to the Current Medication list given to you today.  *If you need a refill on your cardiac medications before your next appointment, please call your pharmacy*   Lab Work: NONE If you have labs (blood work) drawn today and your tests are completely normal, you will receive your results only by: Marland Kitchen MyChart Message (if you have MyChart) OR . A paper copy in the mail If you have any lab test that is abnormal or we need to change your treatment, we will call you to review the results.   Testing/Procedures: NONE   Follow-Up: At [facility], you and your health needs are our priority.  As part of our continuing mission to provide you with exceptional heart care, we have created designated Provider Care Teams.  These Care Teams include your primary Cardiologist (physician) and Advanced Practice Providers (APPs -  Physician Assistants and Nurse Practitioners) who all work together to provide you with the care you need, when you need it.  We recommend signing up for the patient portal called "MyChart".  Sign up information is provided on this After Visit Summary.  MyChart is used to connect with patients for Virtual Visits (Telemedicine).  Patients are able to view lab/test results, encounter notes, upcoming appointments, etc.  Non-urgent messages can be sent to your provider as well.   To learn more about what you can do with MyChart, go to [email or web address].    Your next appointment:   2-3 month(s)  The format for your next appointment:   In Person  Provider:   You may see Pixie Casino, MD or one of the following Advanced Practice Providers on your designated Care Team:    Almyra Deforest, PA-C  Fabian Sharp, PA-C or   Roby Lofts, [place]    Other Instructions

## 2020-05-06 DIAGNOSIS — G4733 Obstructive sleep apnea (adult) (pediatric): Secondary | ICD-10-CM | POA: Diagnosis not present

## 2020-05-07 DIAGNOSIS — E039 Hypothyroidism, unspecified: Secondary | ICD-10-CM | POA: Diagnosis not present

## 2020-05-07 DIAGNOSIS — E1129 Type 2 diabetes mellitus with other diabetic kidney complication: Secondary | ICD-10-CM | POA: Diagnosis not present

## 2020-05-07 DIAGNOSIS — I10 Essential (primary) hypertension: Secondary | ICD-10-CM | POA: Diagnosis not present

## 2020-05-07 DIAGNOSIS — I4891 Unspecified atrial fibrillation: Secondary | ICD-10-CM | POA: Diagnosis not present

## 2020-05-10 ENCOUNTER — Telehealth: Payer: Self-pay

## 2020-05-10 NOTE — Telephone Encounter (Signed)
FAXED [place]: [place] [employer] [place]  Document: medication order form Other records requested: none at this time.  All above requested information has been faxed successfully to [employer] listed above. Documents and fax confirmation have been placed in the faxed file for future reference.

## 2020-05-10 NOTE — Telephone Encounter (Signed)
FAXED [place]: medication order form Other records requested: none at this time.  All above requested information has been faxed successfully to [employer] listed above. Documents and fax confirmation have been placed in the faxed file for future reference.

## 2020-05-14 ENCOUNTER — Telehealth: Payer: Self-pay | Admitting: Internal Medicine

## 2020-05-14 DIAGNOSIS — R269 Unspecified abnormalities of gait and mobility: Secondary | ICD-10-CM

## 2020-05-14 DIAGNOSIS — R531 Weakness: Secondary | ICD-10-CM

## 2020-05-14 DIAGNOSIS — R600 Localized edema: Secondary | ICD-10-CM

## 2020-05-14 DIAGNOSIS — Z951 Presence of aortocoronary bypass graft: Secondary | ICD-10-CM

## 2020-05-14 DIAGNOSIS — I350 Nonrheumatic aortic (valve) stenosis: Secondary | ICD-10-CM

## 2020-05-14 NOTE — Telephone Encounter (Signed)
Patient and wife discussed a hoveround or motorized scooter at last OV with MD.  Randall Wilkins with wife today. Explained that prescription can be provided for them to shop around at medical supply store for what they want and if paperwork is needed from MD, they can bring to office for Dr. Debara Pickett to sign. Will call patient/wife when prescription is ready for pick up.

## 2020-05-15 NOTE — Telephone Encounter (Signed)
Received a fax from [place] [facility] stating that the patient's application is missing the patient's signature. This office handled only the provider portion of the application. The rest of the application was handled by [place] review dept. Contacted Christy Patille,CPP at [phone], which is who is listed on the application that was sent to this office and requested a signature by Dr. Dwyane Dee for the pt's insulin. Currently waiting on a call back after leaving a voicemail for [place].

## 2020-05-19 NOTE — Progress Notes (Signed)
Office Visit Note  Patient: Randall Wilkins             Date of Birth: 01/03/1941           MRN: [id number]             PCP: [name], MD Referring: [name], MD Visit Date: 05/28/2020 Occupation: @GUAROCC @  Subjective:  Medication management.   History of Present Illness: Randall Wilkins is a 79 y.o. male with history of polyarthropathy and joint pain.  He states his joint pain is better now.  He denies any joint swelling.  He denies any gout flares.  He states his right knee joint has been better since the injection.  He has not scheduled a Visco supplement injection with the orthopedic surgeon.  He continues to have some neck and back discomfort which is tolerable.  Activities of Daily Living:  Patient reports morning stiffness for 0 minutes.   Patient Denies nocturnal pain.  Difficulty dressing/grooming: Reports Difficulty climbing stairs: Reports Difficulty getting out of chair: Reports Difficulty using hands for taps, buttons, cutlery, and/or writing: Reports  Review of Systems  Constitutional: Positive for fatigue.  HENT: Positive for mouth dryness. Negative for mouth sores and nose dryness.   Eyes: Negative for itching and dryness.  Respiratory: Negative for shortness of breath and difficulty breathing.   Cardiovascular: Positive for swelling in legs/feet. Negative for chest pain and palpitations.  Gastrointestinal: Negative for blood in stool, constipation and diarrhea.  Endocrine: Negative for increased urination.  Genitourinary: Negative for difficulty urinating.  Musculoskeletal: Negative for arthralgias, joint pain, joint swelling, myalgias, morning stiffness, muscle tenderness and myalgias.  Skin: Negative for color change, rash and redness.  Allergic/Immunologic: Negative for susceptible to infections.  Neurological: Positive for numbness, memory loss and weakness. Negative for dizziness and headaches.  Hematological: Positive for bruising/bleeding  tendency.  Psychiatric/Behavioral: Negative for confusion.    PMFS History:  Patient Active Problem List   Diagnosis Date Noted  . Essential hypertension 03/23/2018  . Mild cognitive impairment 12/24/2017  . Gait abnormality 12/24/2017  . Murmur, cardiac 03/04/2017  . History of stroke 12/12/2015  . Gait disorder 12/12/2015  . Coronary artery disease involving native coronary artery of native heart without angina pectoris   . S/P CABG x 2 03/29/2015  . Aortic valve sclerosis 03/29/2015  . Peptic ulcer with hemorrhage 03/28/2015  . Left main coronary artery disease 03/28/2015  . Chronic diastolic congestive heart failure ([place])   . Chronic kidney disease (CKD), stage III (moderate)   . Dysphagia   . AKI (acute kidney injury) ([place])   . Acute respiratory failure with hypoxia ([place]) 03/25/2015  . NSTEMI (non-ST elevated myocardial infarction) ([place]) 03/25/2015  . Shortness of breath   . UTI (lower urinary tract infection)   . Subendocardial MI subsequent episode care ([place]) 03/24/2015  . SOB (shortness of breath)   . Confusion 03/21/2015  . Weakness generalized 03/21/2015  . Increased urinary frequency 03/21/2015  . Hyperglycemia 03/21/2015  . Thrombocytopenia ([place]) 03/21/2015  . UTI (urinary tract infection) 03/21/2015  . Fall 03/21/2015  . Hematoma of abdominal wall 03/21/2015  . Diarrhea 03/21/2015  . Obstructive sleep apnea 03/21/2015  . Acute renal failure ([place]) 03/21/2015  . Mild diastolic dysfunction 32/95/1884  . Dyspnea on exertion 03/21/2015  . Neuropathy 09/11/2014  . Bilateral leg edema 05/21/2014  . Diabetic peripheral neuropathy associated with type 1 diabetes mellitus ([place]) 11/14/2013  . Type 1 diabetes mellitus with nephropathy ([place]) 11/14/2013  .  Aortic valve stenosis 11/14/2013  . Obesity (BMI 30-39.9) 11/14/2013  . Asymptomatic PVCs 11/14/2013  . Memory impairment 02/04/2013  . Hyperlipidemia 02/04/2013  . Diabetes 1.5, managed as type 1 ([place]) 02/04/2013    . Cerebral infarction ([place]) 02/04/2013  . CAD S/P percutaneous coronary angioplasty 11/14/2004    Past Medical History:  Diagnosis Date  . Aortic stenosis, mild 11/14/2013  . Aortic valve sclerosis 03/29/2015  . Bilateral leg edema 05/21/2014  . CAD (coronary artery disease)   . Chronic diastolic congestive heart failure ([place])   . Chronic kidney disease (CKD), stage III (moderate)   . Diabetes 1.5, managed as type 1 ([place]) 02/04/2013  . Diabetic peripheral neuropathy associated with type 1 diabetes mellitus ([place]) 11/14/2013  . Dysphagia   . Dyspnea on exertion 03/21/2015  . Exogenous obesity   . Gout   . Heart attack ([place])   . History of nuclear stress test 07/2011   dipyridamole; fixed inferolateral defect, worse at stress than rest; no reversible ischemia; low risk scan   . Hyperlipidemia   . Hypertension   . Insulin dependent diabetes mellitus   . Left foot drop   . Left main coronary artery disease 03/28/2015  . Memory loss   . Obesity (BMI 30-39.9) 11/14/2013  . Obstructive sleep apnea 03/21/2015  . OSA on CPAP    uses a cpap  . Peptic ulcer with hemorrhage 03/28/2015  . Peripheral neuropathy   . Rhabdomyolysis   . 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 Stroke ([place])    L patietal with small scattered lacunar infarcts  . Thrombocytopenia ([place]) 03/21/2015  . Venous insufficiency   . Weakness generalized 03/21/2015    Family History  Problem Relation Age of Onset  . Heart disease Mother   . Coronary artery disease Father   . Cancer Maternal Grandmother   . Heart Problems Maternal Grandfather   . Diabetes Son        borderline    Past Surgical History:  Procedure Laterality Date  . BACK SURGERY  2002   lumbosacral. 11 back surgeries total  . Carotid Doppler  03/2013   bilat bulb/prox ICAs - mild amount of fibrous plaque with no evidence of diameter reduction  . CARPAL TUNNEL RELEASE Bilateral 08/09/2014   Procedure: BILATERAL CARPAL TUNNEL  RELEASE;  Surgeon: Daryll Brod, MD;  Location: [facility];  Service: Orthopedics;  Laterality: Bilateral;  ANESTHESIA:  IV REGIONAL BIL FAB  . CHOLECYSTECTOMY    . COLONOSCOPY    . CORONARY ANGIOPLASTY  10/13/1996  . CORONARY ANGIOPLASTY  09/21/1989   emergency PTCA  . CORONARY ANGIOPLASTY  10/13/1996   Multi-Link diagonal & OD stenting (Dr. Marella Chimes)  . CORONARY ANGIOPLASTY  12/03/1997   disease of mid DX-1 ~50% & in mid PLA & PDA (distal lesions) (Dr. Marella Chimes)   . CORONARY ANGIOPLASTY  10/14/1999   progression of disease distal PLA & PDA; progression of disease prox RCA - moderate (Dr. Marella Chimes)   . CORONARY ANGIOPLASTY WITH STENT PLACEMENT  04/04/2004   4.0x35mm non-DES (thrombectomy via AngioJet) to RCA for high grade stenosis (Dr. Marella Chimes)  . CORONARY ARTERY BYPASS GRAFT N/A 03/29/2015   Procedure: CORONARY ARTERY BYPASS GRAFTING TIMES TWO USING LEFT INTERNAL MAMMARY ARTERY AND RIGHT LEG GREATER SAPHENOUS VEIN HARVESTED ENDOSCOPICALLY.;  Surgeon: Rexene Alberts, MD;  Location: [facility];  Service: Open Heart Surgery;  Laterality: N/A;  .  ESOPHAGOGASTRODUODENOSCOPY N/A 03/27/2015   Procedure: ESOPHAGOGASTRODUODENOSCOPY (EGD);  Surgeon: Clarene Essex, MD;  Location: [facility] ENDOSCOPY;  Service: Endoscopy;  Laterality: N/A;  possible dilation  . LEFT HEART CATHETERIZATION WITH CORONARY ANGIOGRAM N/A 03/28/2015   Procedure: LEFT HEART CATHETERIZATION WITH CORONARY ANGIOGRAM;  Surgeon: Peter M Martinique, MD;  Location[facility] CATH LAB;  Service: Cardiovascular;  Laterality: N/A;  . SINUS ENDO W/FUSION    . TONSILLECTOMY    . TRANSTHORACIC ECHOCARDIOGRAM  08/08/2013   EF 55-60%, mild conc hypertrophy, grade 1 diastolic dysfunction; AV with mild stenosis; LA & RA mildly dilated   Social History   Social History Narrative   Lives with wife.    Immunization History  Administered Date(s) Administered  . Influenza,inj,Quad PF,6+ Mos 11/14/2013  . Influenza-Unspecified 08/29/2018    . PFIZER SARS-COV-2 Vaccination 01/26/2020, 02/26/2020     Objective: Vital Signs: BP (!) 142/69 (BP Location: Left Arm, Patient Position: Sitting, Cuff Size: Normal)   Pulse 61   Resp 17   Ht 5\' 7"  (1.702 m)   Wt 243 lb 6.4 oz (110.4 kg)   BMI 38.12 kg/m    Physical Exam Vitals and nursing note reviewed.  Constitutional:      Appearance: He is well-developed.  HENT:     Head: Normocephalic and atraumatic.  Eyes:     Conjunctiva/sclera: Conjunctivae normal.     Pupils: Pupils are equal, round, and reactive to light.  Cardiovascular:     Rate and Rhythm: Normal rate and regular rhythm.     Heart sounds: Normal heart sounds.  Pulmonary:     Effort: Pulmonary effort is normal.     Breath sounds: Normal breath sounds.  Abdominal:     General: Bowel sounds are normal.     Palpations: Abdomen is soft.  Musculoskeletal:     Cervical back: Normal range of motion and neck supple.  Skin:    General: Skin is warm and dry.     Capillary Refill: Capillary refill takes less than 2 seconds.  Neurological:     Mental Status: He is alert and oriented to person, place, and time.     Comments: Left foot drop  Psychiatric:        Behavior: Behavior normal.      Musculoskeletal Exam: He has some limitation range of motion of the cervical spine.  Shoulder joints, elbow joints and wrist joints with good range of motion.  He had bilateral DIP and PIP thickening.  Knee joints with good range of motion.  He has left foot drop.  No synovitis was noted. CDAI Exam: CDAI Score: -- Patient Global: --; Provider Global: -- Swollen: --; Tender: -- Joint Exam 05/28/2020   No joint exam has been documented for this visit   There is currently no information documented on the homunculus. Go to the Rheumatology activity and complete the homunculus joint exam.  Investigation: No additional findings.  Imaging: DG Chest 2 View  Result Date: 04/29/2020 CLINICAL DATA:  Pneumonia EXAM: CHEST - 2  VIEW COMPARISON:  03/26/2020, CT 03/27/2020 FINDINGS: Areas of consolidation in the medial right lung and peripheral mid left lung are again noted. The left lung opacity is rounded. Small right pleural effusion. Heart is normal size. Prior CABG. No acute bony abnormality. IMPRESSION: Persistent bilateral airspace opacities are unchanged and likely reflect persistent multifocal pneumonia. Underlying mass lesion cannot be excluded and recommend continued follow-up to resolution. Electronically Signed   By: Rolm Baptise M.D.   On: 04/29/2020 15:26  XR KNEE 3 VIEW RIGHT  Result Date: 04/29/2020 Severe medial compartment narrowing and severe patellofemoral narrowing was noted.  No chondrocalcinosis was noted. Impression: These findings are consistent with severe end-stage osteoarthritis and severe chondromalacia patella.   Recent Labs: Lab Results  Component Value Date   WBC 7.3 04/30/2020   HGB 15.0 04/30/2020   PLT 151 04/30/2020   NA 141 05/20/2020   K 4.0 05/20/2020   CL 102 05/20/2020   CO2 32 05/20/2020   GLUCOSE 153 (H) 05/20/2020   BUN 23 05/20/2020   CREATININE 1.32 05/20/2020   BILITOT 0.8 05/20/2020   ALKPHOS 72 05/20/2020   AST 14 05/20/2020   ALT 10 05/20/2020   PROT 6.6 05/20/2020   ALBUMIN 3.9 05/20/2020   CALCIUM 9.7 05/20/2020   GFRAA 60 04/30/2020  Apr 30, 2020 uric acid 3.7, RF negative, anti-CCP negative  Speciality Comments: No specialty comments available.  Procedures:  No procedures performed Allergies: Actos [pioglitazone], Metformin and related, and Niaspan [niacin er]   Assessment / Plan:     Visit Diagnoses: Idiopathic chronic gout of multiple sites without tophus - Previous patient of Dr. Charlestine Night and [facility] rheumatology.  He is on allopurinol 300 mg p.o. daily without any flares.  His uric acid is in desirable range.  He denies having any gout flares.  Medication management-most recent labs were stable.  Primary osteoarthritis of right knee -  Severe end-stage osteoarthritis and severe chondromalacia patella.  Knee joint was aspirated by Dr. Marlou Sa in the past.  We will be getting Visco supplements.  DDD (degenerative disc disease), cervical - status post fusion by Dr. Ellene Route with limited range of motion.  DDD (degenerative disc disease), lumbar - Status post fusion by Dr. Ellene Route.  He has limited range of motion chronic pain.  Other medical problems are listed as follows:  Essential hypertension  Aortic valve sclerosis  Asymptomatic PVCs  Chronic diastolic congestive heart failure (HCC)  Coronary artery disease involving native coronary artery of native heart without angina pectoris  NSTEMI (non-ST elevated myocardial infarction) ([place])  Diabetic peripheral neuropathy associated with type 1 diabetes mellitus (HCC)  Neuropathy  Stage 3 chronic kidney disease, unspecified whether stage 3a or 3b CKD  Peptic ulcer with hemorrhage  Obstructive sleep apnea  Memory impairment  Thrombocytopenia (HCC) - Recent platelet count was normal.  Orders: No orders of the defined types were placed in this encounter.  No orders of the defined types were placed in this encounter.    Follow-Up Instructions: Return in about 6 months (around 11/27/2020) for Gout, Osteoarthritis,DDD.   Bo Merino, MD  Note - This record has been created using Editor, commissioning.  Chart creation errors have been sought, but may not always  have been located. Such creation errors do not reflect on  the standard of medical care.

## 2020-05-20 ENCOUNTER — Other Ambulatory Visit (INDEPENDENT_AMBULATORY_CARE_PROVIDER_SITE_OTHER): Payer: Medicare Other

## 2020-05-20 ENCOUNTER — Other Ambulatory Visit: Payer: Self-pay

## 2020-05-20 DIAGNOSIS — Z794 Long term (current) use of insulin: Secondary | ICD-10-CM | POA: Diagnosis not present

## 2020-05-20 DIAGNOSIS — E1165 Type 2 diabetes mellitus with hyperglycemia: Secondary | ICD-10-CM | POA: Diagnosis not present

## 2020-05-20 LAB — COMPREHENSIVE METABOLIC PANEL
ALT: 10 U/L (ref 0–53)
AST: 14 U/L (ref 0–37)
Albumin: 3.9 g/dL (ref 3.5–5.2)
Alkaline Phosphatase: 72 U/L (ref 39–117)
BUN: 23 mg/dL (ref 6–23)
CO2: 32 mEq/L (ref 19–32)
Calcium: 9.7 mg/dL (ref 8.4–10.5)
Chloride: 102 mEq/L (ref 96–112)
Creatinine, Ser: 1.32 mg/dL (ref 0.40–1.50)
GFR: 52.27 mL/min — ABNORMAL LOW (ref >60.00)
Glucose, Bld: 153 mg/dL — ABNORMAL HIGH (ref 70–99)
Potassium: 4 mEq/L (ref 3.5–5.1)
Sodium: 141 mEq/L (ref 135–145)
Total Bilirubin: 0.8 mg/dL (ref 0.2–1.2)
Total Protein: 6.6 g/dL (ref 6.0–8.3)

## 2020-05-20 LAB — HEMOGLOBIN A1C: Hgb A1c MFr Bld: 6.4 % (ref 4.6–6.5)

## 2020-05-22 NOTE — Addendum Note (Signed)
Addended by: Fidel Levy on: 05/22/2020 08:17 AM   Modules accepted: Orders

## 2020-05-23 ENCOUNTER — Ambulatory Visit: Payer: Medicare Other | Admitting: Endocrinology

## 2020-05-23 ENCOUNTER — Encounter: Payer: Self-pay | Admitting: Endocrinology

## 2020-05-23 ENCOUNTER — Other Ambulatory Visit: Payer: Self-pay

## 2020-05-23 VITALS — BP 116/76 | HR 83 | Ht 69.0 in | Wt 245.8 lb

## 2020-05-23 DIAGNOSIS — Z794 Long term (current) use of insulin: Secondary | ICD-10-CM

## 2020-05-23 DIAGNOSIS — E1129 Type 2 diabetes mellitus with other diabetic kidney complication: Secondary | ICD-10-CM

## 2020-05-23 DIAGNOSIS — I10 Essential (primary) hypertension: Secondary | ICD-10-CM

## 2020-05-23 DIAGNOSIS — R809 Proteinuria, unspecified: Secondary | ICD-10-CM

## 2020-05-23 DIAGNOSIS — E1165 Type 2 diabetes mellitus with hyperglycemia: Secondary | ICD-10-CM

## 2020-05-23 NOTE — Telephone Encounter (Signed)
LM for patient/wife that prescription is ready for pick up at [place] office

## 2020-05-23 NOTE — Patient Instructions (Signed)
Check blood sugars on waking up 7 days a week  Also check blood sugars about 2 hours after meals and do this after different meals by rotation  Recommended blood sugar levels on waking up are 90-130 and about 2 hours after meal is 130-160  Please bring your blood sugar monitor to each visit, thank you  Stop Victoza

## 2020-05-23 NOTE — Progress Notes (Signed)
Patient ID: Randall Wilkins, male   DOB: [birth year], 79 y.o.   MRN: [id number]           Reason for Appointment: Follow-up for Type 2 Diabetes   History of Present Illness:          Date of diagnosis of type 2 diabetes mellitus:   1998       Background history:   Has been on insulin since about 2001 He thinks he was given metformin only for a month and may have stopped because of renal dysfunction Also Actos was tried and not clear what side effects he had Does not remember other medication he may have tried but has not tried Victoza or Byetta   Recent history:    INSULIN regimen is:   [place] 56 units at around 8 PM .  Humalog 5 to 10 units before meals  Non-insulin hypoglycemic drugs the patient is taking are: Victoza 0.6 mg daily, Metformin 750 mg daily  His A1c is down to 6.4 compared to 7.3  He is here for his 34-monthvisit  Current management, blood sugar patterns and problems identified:   Blood sugar will be monitored 4 times a day at home recently  Apparently in April he had pneumonia and his appetite was decreased and he started having relatively low blood sugars  His wife cut down his Victoza to 0.6 mg instead of 1.2 and also reduced on his insulin doses  Previously taking up to 20 units of Humalog and now is taking usually not more than 10 units  However not always checking blood sugars after meals to see if the doses are accurate  He does have sporadic significantly high readings but may be from missing some insulin doses  Based on his Premeal blood sugar his wife is giving him 5 to 10 units of Humalog were reviewed from his Contour meter download.  His weight has come down about 14 pounds since his last visit  He still is not eating much especially at lunchtime when he does not get hungry and his wife may be forcing him to eat  He has taken his Metformin in the evening once daily  FASTING blood sugars are still somewhat variable but overall averaging  much better than before    Glucose monitoring:  Checking blood sugars 4 times a day       Glucometer:  contour      Blood Glucose readings by DOWNLOAD for the last 4 weeks:    PRE-MEAL Fasting Lunch Dinner Bedtime Overall  Glucose range:  69-197  72-288 60-358    Mean/median:  111  119  108   112   POST-MEAL PC Breakfast PC Lunch PC Dinner  Glucose range:    157  Mean/median:      Previous readings  PRE-MEAL Fasting Lunch Dinner Bedtime Overall  Glucose range:  114-236  93-216  89-167 ?   Mean/median:  166  147  136   153      Self-care:  Meal times are:  Breakfast is at 8 a.m., lunch: 12 noon Dinner: 5-6 PM   Typical meal intake: Breakfast is sometimes doughnuts or sometimes egg sandwich   lunch is a sandwich or cottage cheese/food, dinner is meat and vegetables/solid    Snacks: Sugar-free Jell-O or pudding, crackers          Dietician visit, most recent: 12/2017               CDE 8/18  Exercise:  unable to do any, using walker for ambulation  Weight history: Range 252-280 previously  Wt Readings from Last 3 Encounters:  05/23/20 245 lb 12.8 oz (111.5 kg)  05/03/20 249 lb (112.9 kg)  04/29/20 256 lb (116.1 kg)    Glycemic control:   Lab Results  Component Value Date   HGBA1C 6.4 05/20/2020   HGBA1C 7.3 (H) 02/12/2020   HGBA1C 6.8 (H) 11/14/2019   Lab Results  Component Value Date   MICROALBUR 35.5 (H) 02/12/2020   LDLCALC 43 05/23/2019   CREATININE 1.32 05/20/2020   Lab Results  Component Value Date   MICRALBCREAT 52.7 (H) 02/12/2020    Lab Results  Component Value Date   FRUCTOSAMINE 252 12/17/2017   FRUCTOSAMINE 233 08/09/2017      Allergies as of 05/23/2020      Reactions   Actos [pioglitazone] Swelling   Metformin And Related Nausea Only   Niaspan [niacin Er] Itching, Rash      Medication List       Accurate as of May 23, 2020 11:49 AM. If you have any questions, ask your nurse or doctor.        allopurinol 300 MG  tablet Commonly known as: ZYLOPRIM Take 1 tablet by mouth daily. Take 1 tab by my mouth daily   amitriptyline 50 MG tablet Commonly known as: ELAVIL Take 50 mg by mouth daily.   apixaban 5 MG Tabs tablet Commonly known as: ELIQUIS Take 1 tablet (5 mg total) by mouth 2 (two) times daily.   aspirin EC 81 MG tablet Take 1 tablet (81 mg total) by mouth daily.   colchicine 0.6 MG tablet Take 0.6 mg by mouth daily as needed.   Contour Next Monitor w/Device Kit 1 Device by Does not apply route 3 (three) times daily. Use to check blood sugars 3 times daily. Dx Code E13.9   Contour Next Test test strip Generic drug: glucose blood USE TO CHECK GLUCOSE 4 TIMES DAILY. DX:E11.65   donepezil 10 MG tablet Commonly known as: ARICEPT TAKE ONE TABLET DAILY.  PLEASE CALL [phone] TO SCHEDULE FOLLOW UP.   Easy Comfort Pen Needles 33G X 4 MM Misc Generic drug: Insulin Pen Needle 1 each by Does not apply route See admin instructions. Use to inject insulin 5 times daily.   furosemide 40 MG tablet Commonly known as: LASIX Take 80 mg by mouth daily.   HumaLOG 100 UNIT/ML cartridge Generic drug: insulin lispro Inject 5-15 Units into the skin 3 (three) times daily with meals. What changed: Another medication with the same name was removed. Continue taking this medication, and follow the directions you see here. Changed by: Elayne Snare, MD   levothyroxine 75 MCG tablet Commonly known as: SYNTHROID Take 75 mcg by mouth daily before breakfast.   lisinopril 10 MG tablet Commonly known as: ZESTRIL Take 1 tablet (10 mg total) by mouth daily.   metFORMIN 750 MG 24 hr tablet Commonly known as: GLUCOPHAGE-XR TAKE 1 TABLET WITH DINNER   metoprolol succinate 25 MG 24 hr tablet Commonly known as: TOPROL-XL Take 1 tablet (25 mg total) by mouth daily.   NON FORMULARY Inhale 1 application into the lungs at bedtime. CPAP   potassium chloride SA 20 MEQ tablet Commonly known as: KLOR-CON Take  20 mEq by mouth 2 (two) times daily.   rosuvastatin 20 MG tablet Commonly known as: CRESTOR Take 20 mg by mouth daily.   tamsulosin 0.4 MG Caps capsule Commonly known as: FLOMAX Take 0.4  mg by mouth daily.   torsemide 10 MG tablet Commonly known as: DEMADEX Take 10 mg by mouth daily.   Tyler Aas FlexTouch 200 UNIT/ML FlexTouch Pen Generic drug: insulin degludec Inject 56 Units into the skin daily. What changed: Another medication with the same name was removed. Continue taking this medication, and follow the directions you see here. Changed by: Elayne Snare, MD   Victoza 18 MG/3ML Sopn Generic drug: liraglutide Inject 0.6 mg into the skin daily. What changed: Another medication with the same name was removed. Continue taking this medication, and follow the directions you see here. Changed by: Elayne Snare, MD       Allergies:  Allergies  Allergen Reactions  . Actos [Pioglitazone] Swelling  . Metformin And Related Nausea Only  . Niaspan [Niacin Er] Itching and Rash    Past Medical History:  Diagnosis Date  . Aortic stenosis, mild 11/14/2013  . Aortic valve sclerosis 03/29/2015  . Bilateral leg edema 05/21/2014  . CAD (coronary artery disease)   . Chronic diastolic congestive heart failure ([place])   . Chronic kidney disease (CKD), stage III (moderate)   . Diabetes 1.5, managed as type 1 ([place]) 02/04/2013  . Diabetic peripheral neuropathy associated with type 1 diabetes mellitus ([place]) 11/14/2013  . Dysphagia   . Dyspnea on exertion 03/21/2015  . Exogenous obesity   . Gout   . Heart attack ([place])   . History of nuclear stress test 07/2011   dipyridamole; fixed inferolateral defect, worse at stress than rest; no reversible ischemia; low risk scan   . Hyperlipidemia   . Hypertension   . Insulin dependent diabetes mellitus   . Left foot drop   . Left main coronary artery disease 03/28/2015  . Memory loss   . Obesity (BMI 30-39.9) 11/14/2013  . Obstructive sleep apnea 03/21/2015  . OSA  on CPAP    uses a cpap  . Peptic ulcer with hemorrhage 03/28/2015  . Peripheral neuropathy   . Rhabdomyolysis   . 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 Stroke ([place])    L patietal with small scattered lacunar infarcts  . Thrombocytopenia ([place]) 03/21/2015  . Venous insufficiency   . Weakness generalized 03/21/2015    Past Surgical History:  Procedure Laterality Date  . BACK SURGERY  2002   lumbosacral. 11 back surgeries total  . Carotid Doppler  03/2013   bilat bulb/prox ICAs - mild amount of fibrous plaque with no evidence of diameter reduction  . CARPAL TUNNEL RELEASE Bilateral 08/09/2014   Procedure: BILATERAL CARPAL TUNNEL RELEASE;  Surgeon: Daryll Brod, MD;  Location: [facility];  Service: Orthopedics;  Laterality: Bilateral;  ANESTHESIA:  IV REGIONAL BIL FAB  . CHOLECYSTECTOMY    . COLONOSCOPY    . CORONARY ANGIOPLASTY  10/13/1996  . CORONARY ANGIOPLASTY  09/21/1989   emergency PTCA  . CORONARY ANGIOPLASTY  10/13/1996   Multi-Link diagonal & OD stenting (Dr. Marella Chimes)  . CORONARY ANGIOPLASTY  12/03/1997   disease of mid DX-1 ~50% & in mid PLA & PDA (distal lesions) (Dr. Marella Chimes)   . CORONARY ANGIOPLASTY  10/14/1999   progression of disease distal PLA & PDA; progression of disease prox RCA - moderate (Dr. Marella Chimes)   . CORONARY ANGIOPLASTY WITH STENT PLACEMENT  04/04/2004   4.0x28m non-DES (thrombectomy via AngioJet) to RCA for high grade stenosis (Dr. RMarella Chimes  . CORONARY ARTERY BYPASS GRAFT N/A 03/29/2015  Procedure: CORONARY ARTERY BYPASS GRAFTING TIMES TWO USING LEFT INTERNAL MAMMARY ARTERY AND RIGHT LEG GREATER SAPHENOUS VEIN HARVESTED ENDOSCOPICALLY.;  Surgeon: Rexene Alberts, MD;  Location: [facility];  Service: Open Heart Surgery;  Laterality: N/A;  . ESOPHAGOGASTRODUODENOSCOPY N/A 03/27/2015   Procedure: ESOPHAGOGASTRODUODENOSCOPY (EGD);  Surgeon: Clarene Essex, MD;  Location: [facility] ENDOSCOPY;  Service: Endoscopy;   Laterality: N/A;  possible dilation  . LEFT HEART CATHETERIZATION WITH CORONARY ANGIOGRAM N/A 03/28/2015   Procedure: LEFT HEART CATHETERIZATION WITH CORONARY ANGIOGRAM;  Surgeon: Peter M Martinique, MD;  Location: [facility] CATH LAB;  Service: Cardiovascular;  Laterality: N/A;  . SINUS ENDO W/FUSION    . TONSILLECTOMY    . TRANSTHORACIC ECHOCARDIOGRAM  08/08/2013   EF 55-60%, mild conc hypertrophy, grade 1 diastolic dysfunction; AV with mild stenosis; LA & RA mildly dilated    Family History  Problem Relation Age of Onset  . Heart disease Mother   . Coronary artery disease Father   . Cancer Maternal Grandmother   . Heart Problems Maternal Grandfather   . Diabetes Son        borderline     Social History:  reports that he quit smoking about 47 years ago. His smoking use included cigarettes. He has a 40.00 pack-year smoking history. He has never used smokeless tobacco. He reports that he does not drink alcohol and does not use drugs.   Review of Systems   Lipid history: Last LDL  on 20 mg rosuvastatin, followed by PCP Has history of CAD    Lab Results  Component Value Date   CHOL 98 05/23/2019   HDL 38.40 (L) 05/23/2019   LDLCALC 43 05/23/2019   TRIG 83.0 05/23/2019   CHOLHDL 3 05/23/2019           Hypertension: Mild and treated with 10 mg lisinopril Also has been followed by PCP  BP Readings from Last 3 Encounters:  05/23/20 116/76  05/03/20 (!) 145/70  04/29/20 (!) 174/73     CKD: His creatinine is somewhat variable but generally stable  His last microalbumin ratio was 53  Lab Results  Component Value Date   CREATININE 1.32 05/20/2020   CREATININE 1.30 (H) 04/30/2020   CREATININE 1.46 02/12/2020    Most recent eye exam was in 2/20, Dr. Gershon Crane and reportedly no retinopathy   Complications of diabetes: Peripheral neuropathy with sensory loss.  Dementia: Followed by neurologist and is on Aricept   LABS:  Lab on 05/20/2020  Component Date Value Ref Range Status    . Sodium 05/20/2020 141  135 - 145 mEq/L Final  . Potassium 05/20/2020 4.0  3.5 - 5.1 mEq/L Final  . Chloride 05/20/2020 102  96 - 112 mEq/L Final  . CO2 05/20/2020 32  19 - 32 mEq/L Final  . Glucose, Bld 05/20/2020 153* 70 - 99 mg/dL Final  . BUN 05/20/2020 23  6 - 23 mg/dL Final  . Creatinine, Ser 05/20/2020 1.32  0.40 - 1.50 mg/dL Final  . Total Bilirubin 05/20/2020 0.8  0.2 - 1.2 mg/dL Final  . Alkaline Phosphatase 05/20/2020 72  39 - 117 U/L Final  . AST 05/20/2020 14  0 - 37 U/L Final  . ALT 05/20/2020 10  0 - 53 U/L Final  . Total Protein 05/20/2020 6.6  6.0 - 8.3 g/dL Final  . Albumin 05/20/2020 3.9  3.5 - 5.2 g/dL Final  . GFR 05/20/2020 52.27* >60.00 mL/min Final  . Calcium 05/20/2020 9.7  8.4 - 10.5 mg/dL Final  . Hgb  A1c MFr Bld 05/20/2020 6.4  4.6 - 6.5 % Final   Glycemic Control Guidelines for People with Diabetes:Non Diabetic:  <6%Goal of Therapy: <7%Additional Action Suggested:  >8%     Physical Examination:  BP 116/76 (BP Location: Left Arm, Patient Position: Sitting, Cuff Size: Large)   Pulse 83   Ht '5\' 9"'  (1.753 m)   Wt 245 lb 12.8 oz (111.5 kg)   SpO2 96%   BMI 36.30 kg/m      ASSESSMENT:  Diabetes type 2, insulin-dependent with obesity and history of neuropathy  See history of present illness for detailed discussion of current diabetes management, blood sugar patterns and problems identified  His A1c is much better at 6.4  He is on basal bolus insulin regimen, Metformin and Victoza  With his having decreased appetite secondary to a pneumonia over the last couple of months he appears to be needing much less mealtime insulin Also despite cutting back on his Victoza he still complains of decreased appetite Has lost about 15 pounds per With this his blood sugars are fluctuating somewhat less and only is getting high readings of his underestimating his insulin dose at mealtimes or forgetting one of the doses Has had only minimal hypoglycemia  recently   HYPERTENSION with microalbuminuria: Blood pressure is treated with lisinopril 10 mg Blood pressure is controlled, no lightheadedness recently Also has microalbuminuria which will need to be followed  PLAN:    Continue same dose of [place] but start taking this at dinnertime for ease of compliance and remembering to take the medication Check blood sugars after meals more often to help assess the amount of Humalog he needs Discussed that especially with his main meal his after meal blood sugars should be usually 130-160 range His wife will try to adjust the Humalog based on amount of carbohydrate that he is eating May leave off Victoza for now Need short-term follow-up in 6 weeks May reduce Tresiba if morning sugars start getting relatively low Also if he is given prednisone for his pulmonary problem will need to adjust his insulin upwards Also no change in metformin for now  Discussed continuous glucose monitoring which will benefit him and since he is checking up to 4 times a day adjustment of his mealtime insulin based on blood sugars he should be able to qualify.  Discussed how this works and given him patient information on this along with list of DME suppliers  Patient Instructions  Check blood sugars on waking up 7 days a week  Also check blood sugars about 2 hours after meals and do this after different meals by rotation  Recommended blood sugar levels on waking up are 90-130 and about 2 hours after meal is 130-160  Please bring your blood sugar monitor to each visit, thank you  Stop Victoza        Elayne Snare 05/23/2020, 11:49 AM   Note: This office note was prepared with Dragon voice recognition system technology. Any transcriptional errors that result from this process are unintentional.

## 2020-05-28 ENCOUNTER — Ambulatory Visit: Payer: Medicare Other | Admitting: Rheumatology

## 2020-05-28 ENCOUNTER — Encounter: Payer: Self-pay | Admitting: Rheumatology

## 2020-05-28 ENCOUNTER — Other Ambulatory Visit: Payer: Self-pay

## 2020-05-28 VITALS — BP 142/69 | HR 61 | Resp 17 | Ht 67.0 in | Wt 243.4 lb

## 2020-05-28 DIAGNOSIS — E1042 Type 1 diabetes mellitus with diabetic polyneuropathy: Secondary | ICD-10-CM

## 2020-05-28 DIAGNOSIS — M5030 Other cervical disc degeneration, unspecified cervical region: Secondary | ICD-10-CM

## 2020-05-28 DIAGNOSIS — I214 Non-ST elevation (NSTEMI) myocardial infarction: Secondary | ICD-10-CM

## 2020-05-28 DIAGNOSIS — N183 Chronic kidney disease, stage 3 unspecified: Secondary | ICD-10-CM

## 2020-05-28 DIAGNOSIS — M1A09X0 Idiopathic chronic gout, multiple sites, without tophus (tophi): Secondary | ICD-10-CM | POA: Diagnosis not present

## 2020-05-28 DIAGNOSIS — G4733 Obstructive sleep apnea (adult) (pediatric): Secondary | ICD-10-CM

## 2020-05-28 DIAGNOSIS — I358 Other nonrheumatic aortic valve disorders: Secondary | ICD-10-CM

## 2020-05-28 DIAGNOSIS — M5136 Other intervertebral disc degeneration, lumbar region: Secondary | ICD-10-CM

## 2020-05-28 DIAGNOSIS — Z79899 Other long term (current) drug therapy: Secondary | ICD-10-CM | POA: Diagnosis not present

## 2020-05-28 DIAGNOSIS — R413 Other amnesia: Secondary | ICD-10-CM

## 2020-05-28 DIAGNOSIS — D696 Thrombocytopenia, unspecified: Secondary | ICD-10-CM

## 2020-05-28 DIAGNOSIS — I2510 Atherosclerotic heart disease of native coronary artery without angina pectoris: Secondary | ICD-10-CM

## 2020-05-28 DIAGNOSIS — I5032 Chronic diastolic (congestive) heart failure: Secondary | ICD-10-CM

## 2020-05-28 DIAGNOSIS — I10 Essential (primary) hypertension: Secondary | ICD-10-CM

## 2020-05-28 DIAGNOSIS — I493 Ventricular premature depolarization: Secondary | ICD-10-CM

## 2020-05-28 DIAGNOSIS — K274 Chronic or unspecified peptic ulcer, site unspecified, with hemorrhage: Secondary | ICD-10-CM

## 2020-05-28 DIAGNOSIS — M1711 Unilateral primary osteoarthritis, right knee: Secondary | ICD-10-CM | POA: Diagnosis not present

## 2020-05-28 DIAGNOSIS — G629 Polyneuropathy, unspecified: Secondary | ICD-10-CM

## 2020-06-05 ENCOUNTER — Encounter: Payer: Self-pay | Admitting: Emergency Medicine

## 2020-06-05 ENCOUNTER — Other Ambulatory Visit: Payer: Self-pay

## 2020-06-05 ENCOUNTER — Ambulatory Visit: Payer: Medicare Other | Admitting: Emergency Medicine

## 2020-06-05 VITALS — BP 132/78 | HR 75 | Temp 98.2°F | Ht 67.0 in | Wt 236.4 lb

## 2020-06-05 DIAGNOSIS — R918 Other nonspecific abnormal finding of lung field: Secondary | ICD-10-CM | POA: Diagnosis not present

## 2020-06-05 NOTE — Telephone Encounter (Signed)
Received another request to complete the same application from pt's PCP. This was done and faxed back.  FAXED [place]: [employer].  Attn:Lori Document: [employer] PAP application (faxed on 98/26/41) Other records requested: none at this time.  All above requested information has been faxed successfully to [employer] listed above. Documents and fax confirmation have been placed in the faxed file for future reference.

## 2020-06-05 NOTE — Assessment & Plan Note (Signed)
Bilateral rounded opacities that did not clear when treated appropriately for possible pneumonia.  I am concerned about possible malignancy.  I will repeat his CT chest now if there has not been interval improvement or resolution compared with his prior in April then he needs bronchoscopy ASAP.  I will contact him after the CT is done and we will plan next steps accordingly.

## 2020-06-05 NOTE — Progress Notes (Signed)
Subjective:    Patient ID: Randall Wilkins, male    DOB: 02-26-1941, 79 y.o.   MRN: [id number]  HPI 79 year old former smoker (40 pack years), with diabetes, hypertension, hyperlipidemia, aortic stenosis, CAD/CABG, CVA, A fib, OSA on CPAP. He is referred today for evaluation of dyspnea on exertion, cough, abnormal chest imaging  He reports that has been dealing with cough, intermittently productive of mucus since the beginning of 2021.  Has been treated with Mucinex, antibiotics in January.  He had a chest x-ray 03/08/2020 that reportedly showed bilateral pulmonary infiltrates.  He was treated with antibiotics for bacterial pneumonia, slow to to respond and was treated with serial courses - poorly tolerated levaquin. His CT scan of the chest 03/27/2020 was performed, showed dense right upper lobe and right lower lobe, left upper lobe infiltrates with a small associated right pleural effusion, possible left infrahilar lymph node.  He had a repeat chest x-ray done 04/29/2020 which I have reviewed, showed persistent bilateral pulmonary infiltrates.   No hx of cancer, had an uncle w lung CA.   He is followed by Dr Debara Pickett   Review of Systems As per HPI  Past Medical History:  Diagnosis Date  . Aortic stenosis, mild 11/14/2013  . Aortic valve sclerosis 03/29/2015  . Bilateral leg edema 05/21/2014  . CAD (coronary artery disease)   . Chronic diastolic congestive heart failure ([place])   . Chronic kidney disease (CKD), stage III (moderate)   . Diabetes 1.5, managed as type 1 ([place]) 02/04/2013  . Diabetic peripheral neuropathy associated with type 1 diabetes mellitus ([place]) 11/14/2013  . Dysphagia   . Dyspnea on exertion 03/21/2015  . Exogenous obesity   . Gout   . Heart attack ([place])   . History of nuclear stress test 07/2011   dipyridamole; fixed inferolateral defect, worse at stress than rest; no reversible ischemia; low risk scan   . Hyperlipidemia   . Hypertension   . Insulin dependent diabetes  mellitus   . Left foot drop   . Left main coronary artery disease 03/28/2015  . Memory loss   . Obesity (BMI 30-39.9) 11/14/2013  . Obstructive sleep apnea 03/21/2015  . OSA on CPAP    uses a cpap  . Peptic ulcer with hemorrhage 03/28/2015  . Peripheral neuropathy   . Rhabdomyolysis   . 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 Stroke ([place])    L patietal with small scattered lacunar infarcts  . Thrombocytopenia ([place]) 03/21/2015  . Venous insufficiency   . Weakness generalized 03/21/2015     Family History  Problem Relation Age of Onset  . Heart disease Mother   . Coronary artery disease Father   . Cancer Maternal Grandmother   . Heart Problems Maternal Grandfather   . Diabetes Son        borderline      Social History   Socioeconomic History  . Marital status: Married    Spouse name: Not on file  . Number of children: 3  . Years of education: 93  . Highest education level: Not on file  Occupational History  . Not on file  Tobacco Use  . Smoking status: Former Smoker    Packs/day: 2.00    Years: 20.00    Pack years: 40.00    Types: Cigarettes    Quit date: 01/25/1974    Years since quitting: 46.3  . Smokeless tobacco: Never Used  Vaping  Use  . Vaping Use: Never used  Substance and Sexual Activity  . Alcohol use: No    Alcohol/week: 0.0 standard drinks  . Drug use: No  . Sexual activity: Not on file  Other Topics Concern  . Not on file  Social History Narrative   Lives with wife.    Social Determinants of Health   Financial Resource Strain:   . Difficulty of Paying Living Expenses:   Food Insecurity:   . Worried About Charity fundraiser in the Last Year:   . Arboriculturist in the Last Year:   Transportation Needs:   . Film/video editor (Medical):   Marland Kitchen Lack of Transportation (Non-Medical):   Physical Activity:   . Days of Exercise per Week:   . Minutes of Exercise per Session:   Stress:   . Feeling of Stress :     Social Connections:   . Frequency of Communication with Friends and Family:   . Frequency of Social Gatherings with Friends and Family:   . Attends Religious Services:   . Active Member of Clubs or Organizations:   . Attends Archivist Meetings:   Marland Kitchen Marital Status:   Intimate Partner Violence:   . Fear of Current or Ex-Partner:   . Emotionally Abused:   Marland Kitchen Physically Abused:   . Sexually Abused:       Allergies  Allergen Reactions  . Actos [Pioglitazone] Swelling  . Metformin And Related Nausea Only  . Niaspan [Niacin Er] Itching and Rash     Outpatient Medications Prior to Visit  Medication Sig Dispense Refill  . allopurinol (ZYLOPRIM) 300 MG tablet Take 1 tablet by mouth daily. Take 1 tab by my mouth daily  2  . amitriptyline (ELAVIL) 50 MG tablet Take 50 mg by mouth daily.    Marland Kitchen apixaban (ELIQUIS) 5 MG TABS tablet Take 1 tablet (5 mg total) by mouth 2 (two) times daily. 60 tablet 11  . aspirin 81 MG tablet Take 1 tablet (81 mg total) by mouth daily.    . Blood Glucose Monitoring Suppl (CONTOUR NEXT MONITOR) w/Device KIT 1 Device by Does not apply route 3 (three) times daily. Use to check blood sugars 3 times daily. Dx Code E13.9 1 kit 2  . colchicine 0.6 MG tablet Take 0.6 mg by mouth daily as needed.    . donepezil (ARICEPT) 10 MG tablet TAKE ONE TABLET DAILY.  PLEASE CALL [phone] TO SCHEDULE FOLLOW UP. 90 tablet 0  . furosemide (LASIX) 40 MG tablet Take 80 mg by mouth daily.    Marland Kitchen glucose blood (CONTOUR NEXT TEST) test strip USE TO CHECK GLUCOSE 4 TIMES DAILY. DX:E11.65 150 each 3  . insulin degludec (TRESIBA FLEXTOUCH) 200 UNIT/ML FlexTouch Pen Inject 56 Units into the skin daily.    . insulin lispro (HUMALOG) 100 UNIT/ML cartridge Inject 5-15 Units into the skin 3 (three) times daily with meals.    . Insulin Pen Needle (EASY COMFORT PEN NEEDLES) 33G X 4 MM MISC 1 each by Does not apply route See admin instructions. Use to inject insulin 5 times daily. 150 each  3  . levothyroxine (SYNTHROID, LEVOTHROID) 75 MCG tablet Take 75 mcg by mouth daily before breakfast.   1  . lisinopril (ZESTRIL) 10 MG tablet Take 1 tablet (10 mg total) by mouth daily. 90 tablet 2  . metFORMIN (GLUCOPHAGE-XR) 750 MG 24 hr tablet TAKE 1 TABLET WITH DINNER 90 tablet 0  . metoprolol succinate (TOPROL-XL) 25  MG 24 hr tablet Take 1 tablet (25 mg total) by mouth daily. 90 tablet 3  . NON FORMULARY Inhale 1 application into the lungs at bedtime. CPAP    . potassium chloride SA (K-DUR,KLOR-CON) 20 MEQ tablet Take 20 mEq by mouth 2 (two) times daily.     . rosuvastatin (CRESTOR) 20 MG tablet Take 20 mg by mouth daily.    . Tamsulosin HCl (FLOMAX) 0.4 MG CAPS Take 0.4 mg by mouth daily.     Marland Kitchen torsemide (DEMADEX) 10 MG tablet Take 10 mg by mouth daily.     Marland Kitchen liraglutide (VICTOZA) 18 MG/3ML SOPN Inject 0.6 mg into the skin daily. (Patient not taking: Reported on 06/05/2020)     No facility-administered medications prior to visit.        Objective:   Physical Exam Vitals:   06/05/20 1416  BP: 132/78  Pulse: 75  Temp: 98.2 F (36.8 C)  TempSrc: Oral  SpO2: 96%  Weight: 236 lb 6.4 oz (107.2 kg)  Height: '5\' 7"'  (1.702 m)   Gen: Pleasant, obese, in no distress,  normal affect  ENT: No lesions,  mouth clear,  oropharynx clear, no postnasal drip  Neck: No JVD, no stridor  Lungs: No use of accessory muscles, few scattered rhonchi  Cardiovascular: RRR, heart sounds normal, no murmur or gallops, no peripheral edema  Musculoskeletal: No deformities, brace on his LLE  Neuro: alert, awake, non focal  Skin: Warm, no lesions or rash       Assessment & Plan:  Lung mass Bilateral rounded opacities that did not clear when treated appropriately for possible pneumonia.  I am concerned about possible malignancy.  I will repeat his CT chest now if there has not been interval improvement or resolution compared with his prior in April then he needs bronchoscopy ASAP.  I will contact  him after the CT is done and we will plan next steps accordingly.  Baltazar Apo, MD, PhD 06/05/2020, 2:45 PM [facility] [phone] or if no answer [phone]

## 2020-06-05 NOTE — Patient Instructions (Signed)
We will repeat your CT chest as soon as possible.  Depending on the results of your CT we will probably arrange for bronchoscopy to further evaluate the abnormalities in the lungs.  We will discuss the results by phone after the CT is done and make arrangements. Follow Dr. Lamonte Sakai in 1 month or next available

## 2020-06-07 DIAGNOSIS — E1149 Type 2 diabetes mellitus with other diabetic neurological complication: Secondary | ICD-10-CM | POA: Diagnosis not present

## 2020-06-07 DIAGNOSIS — N183 Chronic kidney disease, stage 3 unspecified: Secondary | ICD-10-CM | POA: Diagnosis not present

## 2020-06-07 DIAGNOSIS — I10 Essential (primary) hypertension: Secondary | ICD-10-CM | POA: Diagnosis not present

## 2020-06-07 DIAGNOSIS — E1129 Type 2 diabetes mellitus with other diabetic kidney complication: Secondary | ICD-10-CM | POA: Diagnosis not present

## 2020-06-13 NOTE — Telephone Encounter (Signed)
FAXED [place]: Development worker, community (Faxed on 06/12/20) Document: [employer] reorder form Other records requested: none at this time.  All above requested information has been faxed successfully to [employer] listed above. Documents and fax confirmation have been placed in the faxed file for future reference.

## 2020-06-14 ENCOUNTER — Telehealth: Payer: Self-pay | Admitting: Internal Medicine

## 2020-06-14 NOTE — Telephone Encounter (Signed)
Spoke with McCoole from [place]. She states on 6/16 she faxed over MD portion of [employer] patient assistance application for Dr. Debara Pickett to sign and return. Explained that MD has been OOO since the week prior to this date and will be back next week and can sign paperwork then. If paperwork has not been received, will complete MD portion and then send to her via fax @ [phone]

## 2020-06-14 NOTE — Telephone Encounter (Signed)
   Lory from [place] physician calling to follow up if DR. Hilty signed pt's patient assistance request

## 2020-06-18 ENCOUNTER — Other Ambulatory Visit: Payer: Self-pay | Admitting: Endocrinology

## 2020-06-20 NOTE — Telephone Encounter (Signed)
MD portion of patient assistance application completed and returned to [facility] via fax

## 2020-06-21 ENCOUNTER — Ambulatory Visit
Admission: RE | Admit: 2020-06-21 | Discharge: 2020-06-21 | Disposition: A | Discharge status: Home / Self Care | Payer: Medicare Other | Source: Ambulatory Visit | Attending: Emergency Medicine | Admitting: Emergency Medicine

## 2020-06-21 DIAGNOSIS — R918 Other nonspecific abnormal finding of lung field: Secondary | ICD-10-CM | POA: Diagnosis not present

## 2020-06-21 DIAGNOSIS — J90 Pleural effusion, not elsewhere classified: Secondary | ICD-10-CM | POA: Diagnosis not present

## 2020-07-03 DIAGNOSIS — E118 Type 2 diabetes mellitus with unspecified complications: Secondary | ICD-10-CM | POA: Diagnosis not present

## 2020-07-03 DIAGNOSIS — Z794 Long term (current) use of insulin: Secondary | ICD-10-CM | POA: Diagnosis not present

## 2020-07-04 ENCOUNTER — Other Ambulatory Visit: Payer: Self-pay

## 2020-07-04 ENCOUNTER — Encounter: Payer: Self-pay | Admitting: Endocrinology

## 2020-07-04 ENCOUNTER — Ambulatory Visit: Payer: Medicare Other | Admitting: Endocrinology

## 2020-07-04 VITALS — BP 130/72 | HR 73 | Ht 67.0 in | Wt 248.4 lb

## 2020-07-04 DIAGNOSIS — E1165 Type 2 diabetes mellitus with hyperglycemia: Secondary | ICD-10-CM | POA: Diagnosis not present

## 2020-07-04 DIAGNOSIS — E7800 Pure hypercholesterolemia, unspecified: Secondary | ICD-10-CM | POA: Diagnosis not present

## 2020-07-04 DIAGNOSIS — Z794 Long term (current) use of insulin: Secondary | ICD-10-CM

## 2020-07-04 DIAGNOSIS — R600 Localized edema: Secondary | ICD-10-CM

## 2020-07-04 NOTE — Progress Notes (Signed)
Patient ID: Randall Wilkins, male   DOB: Jan 02, 1941, 79 y.o.   MRN: [id number]           Reason for Appointment: Follow-up for Type 2 Diabetes   History of Present Illness:          Date of diagnosis of type 2 diabetes mellitus:   1998       Background history:   Has been on insulin since about 2001 He thinks he was given metformin only for a month and may have stopped because of renal dysfunction Also Actos was tried and not clear what side effects he had Does not remember other medication he may have tried but has not tried Victoza or Byetta   Recent history:    INSULIN regimen is:   [place] 56 units at around 8 PM .  Humalog 5 to 7 units before meals  Non-insulin hypoglycemic drugs the patient is taking are:  Metformin 750 mg daily  His A1c is down to 6.4 in June compared to 7.3  He is here for his 25-monthvisit  Current management, blood sugar patterns and problems identified:   His wife is adjusting his insulin doses and helping with his monitoring and medications.  Victoza was stopped because of decreased appetite  However he thinks he is eating more normally especially recently when he has started eating tomato sandwiches at lunch  Because of difficulty with finger sticking he is not monitoring blood sugars enough  However all of his readings at bedtime are significantly high even though these are about 4-5 hours after eating  His wife was told to adjust his Humalog based on how much he is eating but she is only adjusting it based on his Premeal blood sugar  With this his blood sugars usually are fairly good at lunch, higher on one occasion when he forgot his morning insulin  He has taken his Metformin in the evening once daily  FASTING blood sugars are staying close to normal with a couple of readings in the 60s also  He was told to reduce his TTyler Aasif morning sugars are low normal but he did not do so  No overnight hypoglycemia    Glucose monitoring:   Checking blood sugars 4 times a day       Glucometer:  contour      Blood Glucose readings by DOWNLOAD for the last 4 weeks:    PRE-MEAL Fasting Lunch Dinner Bedtime Overall  Glucose range:  60- 130  69-338  75-199  198-292   Mean/median:  93  109  109  110   Previous readings:  PRE-MEAL Fasting Lunch Dinner Bedtime Overall  Glucose range:  69-197  72-288 60-358    Mean/median:  111  119  108   112   POST-MEAL PC Breakfast PC Lunch PC Dinner  Glucose range:    157  Mean/median:        Self-care:  Meal times are:  Breakfast is at 8 a.m., lunch: 12 noon Dinner: 5-6 PM   Typical meal intake: Breakfast is sometimes doughnuts or sometimes egg sandwich   lunch is a sandwich or cottage cheese/food, dinner is meat and vegetables/solid    Snacks: Sugar-free Jell-O or pudding, crackers          Dietician visit, most recent: 12/2017               CDE 8/18  Exercise:  unable to do any, using walker for ambulation  Weight history: Range  252-280 previously  Wt Readings from Last 3 Encounters:  07/04/20 (!) 248 lb 6.4 oz (112.7 kg)  06/05/20 236 lb 6.4 oz (107.2 kg)  05/28/20 243 lb 6.4 oz (110.4 kg)    Glycemic control:   Lab Results  Component Value Date   HGBA1C 6.4 05/20/2020   HGBA1C 7.3 (H) 02/12/2020   HGBA1C 6.8 (H) 11/14/2019   Lab Results  Component Value Date   MICROALBUR 35.5 (H) 02/12/2020   LDLCALC 43 05/23/2019   CREATININE 1.32 05/20/2020   Lab Results  Component Value Date   MICRALBCREAT 52.7 (H) 02/12/2020    Lab Results  Component Value Date   FRUCTOSAMINE 252 12/17/2017   FRUCTOSAMINE 233 08/09/2017      Allergies as of 07/04/2020      Reactions   Actos [pioglitazone] Swelling   Metformin And Related Nausea Only   Niaspan [niacin Er] Itching, Rash      Medication List       Accurate as of July 04, 2020  2:50 PM. If you have any questions, ask your nurse or doctor.        allopurinol 300 MG tablet Commonly known as: ZYLOPRIM Take  1 tablet by mouth daily. Take 1 tab by my mouth daily   amitriptyline 50 MG tablet Commonly known as: ELAVIL Take 50 mg by mouth daily.   apixaban 5 MG Tabs tablet Commonly known as: ELIQUIS Take 1 tablet (5 mg total) by mouth 2 (two) times daily.   aspirin EC 81 MG tablet Take 1 tablet (81 mg total) by mouth daily.   colchicine 0.6 MG tablet Take 0.6 mg by mouth daily as needed.   Contour Next Monitor w/Device Kit 1 Device by Does not apply route 3 (three) times daily. Use to check blood sugars 3 times daily. Dx Code E13.9   Contour Next Test test strip Generic drug: glucose blood USE 1 STRIP TO CHECK GLUCOSE 4 TIMES DAILY   donepezil 10 MG tablet Commonly known as: ARICEPT TAKE ONE TABLET DAILY.  PLEASE CALL [phone] TO SCHEDULE FOLLOW UP.   Easy Comfort Pen Needles 33G X 4 MM Misc Generic drug: Insulin Pen Needle 1 each by Does not apply route See admin instructions. Use to inject insulin 5 times daily.   furosemide 40 MG tablet Commonly known as: LASIX Take 80 mg by mouth daily.   HumaLOG 100 UNIT/ML cartridge Generic drug: insulin lispro Inject 5-15 Units into the skin 3 (three) times daily with meals.   levothyroxine 75 MCG tablet Commonly known as: SYNTHROID Take 75 mcg by mouth daily before breakfast.   lisinopril 10 MG tablet Commonly known as: ZESTRIL Take 1 tablet (10 mg total) by mouth daily.   metFORMIN 750 MG 24 hr tablet Commonly known as: GLUCOPHAGE-XR TAKE 1 TABLET WITH DINNER   metoprolol succinate 25 MG 24 hr tablet Commonly known as: TOPROL-XL Take 1 tablet (25 mg total) by mouth daily.   NON FORMULARY Inhale 1 application into the lungs at bedtime. CPAP   potassium chloride SA 20 MEQ tablet Commonly known as: KLOR-CON Take 20 mEq by mouth 2 (two) times daily.   rosuvastatin 20 MG tablet Commonly known as: CRESTOR Take 20 mg by mouth daily.   tamsulosin 0.4 MG Caps capsule Commonly known as: FLOMAX Take 0.4 mg by mouth  daily.   torsemide 10 MG tablet Commonly known as: DEMADEX Take 10 mg by mouth daily.   Tyler Aas FlexTouch 200 UNIT/ML FlexTouch Pen Generic drug: insulin degludec Inject  56 Units into the skin daily.       Allergies:  Allergies  Allergen Reactions  . Actos [Pioglitazone] Swelling  . Metformin And Related Nausea Only  . Niaspan [Niacin Er] Itching and Rash    Past Medical History:  Diagnosis Date  . Aortic stenosis, mild 11/14/2013  . Aortic valve sclerosis 03/29/2015  . Bilateral leg edema 05/21/2014  . CAD (coronary artery disease)   . Chronic diastolic congestive heart failure ([place])   . Chronic kidney disease (CKD), stage III (moderate)   . Diabetes 1.5, managed as type 1 ([place]) 02/04/2013  . Diabetic peripheral neuropathy associated with type 1 diabetes mellitus ([place]) 11/14/2013  . Dysphagia   . Dyspnea on exertion 03/21/2015  . Exogenous obesity   . Gout   . Heart attack ([place])   . History of nuclear stress test 07/2011   dipyridamole; fixed inferolateral defect, worse at stress than rest; no reversible ischemia; low risk scan   . Hyperlipidemia   . Hypertension   . Insulin dependent diabetes mellitus   . Left foot drop   . Left main coronary artery disease 03/28/2015  . Memory loss   . Obesity (BMI 30-39.9) 11/14/2013  . Obstructive sleep apnea 03/21/2015  . OSA on CPAP    uses a cpap  . Peptic ulcer with hemorrhage 03/28/2015  . Peripheral neuropathy   . Rhabdomyolysis   . 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 Stroke ([place])    L patietal with small scattered lacunar infarcts  . Thrombocytopenia ([place]) 03/21/2015  . Venous insufficiency   . Weakness generalized 03/21/2015    Past Surgical History:  Procedure Laterality Date  . BACK SURGERY  2002   lumbosacral. 11 back surgeries total  . Carotid Doppler  03/2013   bilat bulb/prox ICAs - mild amount of fibrous plaque with no evidence of diameter reduction  . CARPAL TUNNEL RELEASE  Bilateral 08/09/2014   Procedure: BILATERAL CARPAL TUNNEL RELEASE;  Surgeon: Daryll Brod, MD;  Location: [facility];  Service: Orthopedics;  Laterality: Bilateral;  ANESTHESIA:  IV REGIONAL BIL FAB  . CHOLECYSTECTOMY    . COLONOSCOPY    . CORONARY ANGIOPLASTY  10/13/1996  . CORONARY ANGIOPLASTY  09/21/1989   emergency PTCA  . CORONARY ANGIOPLASTY  10/13/1996   Multi-Link diagonal & OD stenting (Dr. Marella Chimes)  . CORONARY ANGIOPLASTY  12/03/1997   disease of mid DX-1 ~50% & in mid PLA & PDA (distal lesions) (Dr. Marella Chimes)   . CORONARY ANGIOPLASTY  10/14/1999   progression of disease distal PLA & PDA; progression of disease prox RCA - moderate (Dr. Marella Chimes)   . CORONARY ANGIOPLASTY WITH STENT PLACEMENT  04/04/2004   4.0x7m non-DES (thrombectomy via AngioJet) to RCA for high grade stenosis (Dr. RMarella Chimes  . CORONARY ARTERY BYPASS GRAFT N/A 03/29/2015   Procedure: CORONARY ARTERY BYPASS GRAFTING TIMES TWO USING LEFT INTERNAL MAMMARY ARTERY AND RIGHT LEG GREATER SAPHENOUS VEIN HARVESTED ENDOSCOPICALLY.;  Surgeon: CRexene Alberts MD;  [facility]  Service: Open Heart Surgery;  Laterality: N/A;  . ESOPHAGOGASTRODUODENOSCOPY N/A 03/27/2015   Procedure: ESOPHAGOGASTRODUODENOSCOPY (EGD);  Surgeon: MClarene Essex MD;  [facility];  Service: Endoscopy;  Laterality: N/A;  possible dilation  . LEFT HEART CATHETERIZATION WITH CORONARY ANGIOGRAM N/A 03/28/2015   Procedure: LEFT HEART CATHETERIZATION WITH CORONARY ANGIOGRAM;  Surgeon: Peter M JMartinique MD;  [facility];  Service: Cardiovascular;  Laterality:  N/A;  . SINUS ENDO W/FUSION    . TONSILLECTOMY    . TRANSTHORACIC ECHOCARDIOGRAM  08/08/2013   EF 55-60%, mild conc hypertrophy, grade 1 diastolic dysfunction; AV with mild stenosis; LA & RA mildly dilated    Family History  Problem Relation Age of Onset  . Heart disease Mother   . Coronary artery disease Father   . Cancer Maternal Grandmother   .  Heart Problems Maternal Grandfather   . Diabetes Son        borderline     Social History:  reports that he quit smoking about 46 years ago. His smoking use included cigarettes. He has a 40.00 pack-year smoking history. He has never used smokeless tobacco. He reports that he does not drink alcohol and does not use drugs.   Review of Systems   Lipid history: Last LDL  on 20 mg rosuvastatin, followed by PCP No recent labs available  Has history of CAD    Lab Results  Component Value Date   CHOL 98 05/23/2019   HDL 38.40 (L) 05/23/2019   LDLCALC 43 05/23/2019   TRIG 83.0 05/23/2019   CHOLHDL 3 05/23/2019           Hypertension: Mild and treated with 10 mg lisinopril Also has been followed by PCP  BP Readings from Last 3 Encounters:  07/04/20 (!) 130/72  06/05/20 132/78  05/28/20 (!) 142/69     CKD: His creatinine is somewhat variable but generally stable  His last microalbumin ratio was 53  Lab Results  Component Value Date   CREATININE 1.32 05/20/2020   CREATININE 1.30 (H) 04/30/2020   CREATININE 1.46 02/12/2020    Most recent eye exam was in 2/20, Dr. Gershon Crane and reportedly no retinopathy   Complications of diabetes: Peripheral neuropathy with sensory loss.  Dementia: Followed by neurologist and is on Aricept   LABS:  No visits with results within 1 Week(s) from this visit.  Latest known visit with results is:  Lab on 05/20/2020  Component Date Value Ref Range Status  . Sodium 05/20/2020 141  135 - 145 mEq/L Final  . Potassium 05/20/2020 4.0  3.5 - 5.1 mEq/L Final  . Chloride 05/20/2020 102  96 - 112 mEq/L Final  . CO2 05/20/2020 32  19 - 32 mEq/L Final  . Glucose, Bld 05/20/2020 153* 70 - 99 mg/dL Final  . BUN 05/20/2020 23  6 - 23 mg/dL Final  . Creatinine, Ser 05/20/2020 1.32  0.40 - 1.50 mg/dL Final  . Total Bilirubin 05/20/2020 0.8  0.2 - 1.2 mg/dL Final  . Alkaline Phosphatase 05/20/2020 72  39 - 117 U/L Final  . AST 05/20/2020 14  0 - 37 U/L  Final  . ALT 05/20/2020 10  0 - 53 U/L Final  . Total Protein 05/20/2020 6.6  6.0 - 8.3 g/dL Final  . Albumin 05/20/2020 3.9  3.5 - 5.2 g/dL Final  . GFR 05/20/2020 52.27* >60.00 mL/min Final  . Calcium 05/20/2020 9.7  8.4 - 10.5 mg/dL Final  . Hgb A1c MFr Bld 05/20/2020 6.4  4.6 - 6.5 % Final   Glycemic Control Guidelines for People with Diabetes:Non Diabetic:  <6%Goal of Therapy: <7%Additional Action Suggested:  >8%     Physical Examination:  BP (!) 130/72 (BP Location: Left Arm, Patient Position: Sitting, Cuff Size: Large)   Pulse 73   Ht '5\' 7"'  (1.702 m)   Wt (!) 248 lb 6.4 oz (112.7 kg)   SpO2 95%   BMI 38.90  kg/m      1+ lower leg edema present  ASSESSMENT:  Diabetes type 2, insulin-dependent with obesity and history of neuropathy  See history of present illness for detailed discussion of current diabetes management, blood sugar patterns and problems identified  His A1c is 6.4 last month  He is on basal bolus insulin regimen, Metformin and currently not on Victoza  He has recovered his appetite more recently and weight is starting to go up also However despite his Premeal blood sugars being mostly near normal and occasionally low his blood sugars are usually high when checked at bedtime Not checking blood sugars enough after supper or any other meals  His wife is usually adjusting his insulin but she is still adjusting his premeal insulin based on the blood sugar level rather than postprandial readings are what he is eating Has had at least 3 readings in the 60s in the morning   HYPERTENSION with microalbuminuria: Blood pressure is controlled with lisinopril 10 mg Still appears to have some edema although reportedly improved, needs to follow-up with his PCP or cardiologist   PLAN:    Restart Victoza since his postprandial readings are higher and he is able to get the patient assistance program To start with 0.6 mg for 10 days and then 1.2 Reduce TRESIBA down to  50 Increase suppertime Humalog by at least 5 units Less monitoring in the morning and more blood sugar testing 2 to 3 hours after dinner Adjust Humalog further based on whether his blood sugars are in the 130-180 range after eating and more insulin up to 15 units for larger meals Discussed blood sugar targets both morning and afternoon To call if he has any difficulties with blood sugar control or more frequent low sugars  We will send a referral to the diabetes educator to start the freestyle libre when he is able to get this  Check lipids on the next visit  Patient Instructions  Tresiba 50 units  Humalog 10-12 units  More sugars at 7-9 pm  Victoza 0.22m daily for 10 days then 1.257m       AjElayne Snare/22/2021, 2:50 PM   Note: This office note was prepared with Dragon voice recognition system technology. Any transcriptional errors that result from this process are unintentional.

## 2020-07-04 NOTE — Patient Instructions (Signed)
Tresiba 50 units  Humalog 10-12 units  More sugars at 7-9 pm  Victoza 0.6mg  daily for 10 days then 1.2mg 

## 2020-07-08 ENCOUNTER — Other Ambulatory Visit: Payer: Self-pay

## 2020-07-08 ENCOUNTER — Encounter: Payer: Self-pay | Admitting: Internal Medicine

## 2020-07-08 ENCOUNTER — Ambulatory Visit: Payer: Medicare Other | Admitting: Internal Medicine

## 2020-07-08 VITALS — BP 177/78 | HR 62 | Ht 67.0 in | Wt 246.6 lb

## 2020-07-08 DIAGNOSIS — Z951 Presence of aortocoronary bypass graft: Secondary | ICD-10-CM

## 2020-07-08 DIAGNOSIS — J984 Other disorders of lung: Secondary | ICD-10-CM

## 2020-07-08 DIAGNOSIS — I10 Essential (primary) hypertension: Secondary | ICD-10-CM

## 2020-07-08 DIAGNOSIS — I4891 Unspecified atrial fibrillation: Secondary | ICD-10-CM | POA: Diagnosis not present

## 2020-07-08 NOTE — Patient Instructions (Signed)
Medication Instructions:  No Changes *If you need a refill on your cardiac medications before your next appointment, please call your pharmacy*   Lab Work: None Ordered If you have labs (blood work) drawn today and your tests are completely normal, you will receive your results only by: Marland Kitchen MyChart Message (if you have MyChart) OR . A paper copy in the mail If you have any lab test that is abnormal or we need to change your treatment, we will call you to review the results.   Testing/Procedures: None Ordered   Follow-Up: At [facility], you and your health needs are our priority.  As part of our continuing mission to provide you with exceptional heart care, we have created designated Provider Care Teams.  These Care Teams include your primary Cardiologist (physician) and Advanced Practice Providers (APPs -  Physician Assistants and Nurse Practitioners) who all work together to provide you with the care you need, when you need it.  We recommend signing up for the patient portal called "MyChart".  Sign up information is provided on this After Visit Summary.  MyChart is used to connect with patients for Virtual Visits (Telemedicine).  Patients are able to view lab/test results, encounter notes, upcoming appointments, etc.  Non-urgent messages can be sent to your provider as well.   To learn more about what you can do with MyChart, go to [email or web address].    Your next appointment:   6 month(s)  The format for your next appointment:   In Person  Provider:   You may see Pixie Casino, MD or one of the following Advanced Practice Providers on your designated Care Team:    Almyra Deforest, PA-C  Fabian Sharp, PA-C or   Roby Lofts, [place]    Other Instructions

## 2020-07-08 NOTE — Progress Notes (Signed)
OFFICE NOTE  Chief Complaint:  Follow-up A. fib  Primary Care Physician: [name], MD  HPI:  Randall Wilkins is a pleasant 79 year old male patient who was formerly followed by Dr. Rollene Fare. He is here to establish cardiac care with [place] today. He has a history of obesity, multiple comorbidities including insulin-dependent diabetes, diabetic peripheral neuropathy, diabetic nephropathy, venous insufficiency and peripheral neuropathy. He also has obstructive sleep apnea on CPAP and coronary artery disease. He unfortunately suffered a left parietal stroke with small lacunar infarcts noted on MRI in 2013. He is a left foot drop from an L4/L5 neuropathy and wears a brace for this. From a cardiac standpoint he had a large non-drug-eluting stent placed in the RCA in 2005. He has had a stent placed at 97 to a diagonal which was noted to be patent. He's had some problems with dizziness which is improved. He [place] as large to medium secondary to neuropathy and venous insufficiency. Unfortunately not been able to lose a lot of weight due to his difficulty in ambulating and painful neuropathy.  He denies any cardiac chest pain or worsening shortness of breath.  [name] for a six-month followup. He is having no complaints. He does report some lower extremity swelling which is stable. He has mild aortic stenosis which is been stable as well. His last echo was in August 2014. He denies any chest pain or worsening shortness of breath. He recently had lab work to Randall Wilkins office which we will obtain.  I saw [name] today for hospital follow-up. He is reportedly doing much better. He has more energy and no significant chest discomfort. Between our last appointment see presented with a urinary tract infection and ultimately had chest pain and profuse diaphoresis. He was found to have elevated troponin and underwent cardiac catheterization. This demonstrated the following:  Coronary  angiography: Coronary dominance: right  Left mainstem: 95% distal left main stenosis.  Left anterior descending (LAD): There is 95% ostial LAD stenosis. The LAD is a large vessel. The LAD and diagonal otherwise are without significant disease.  There is a large ramus intermediate branch with 99% ostial stenosis.  Left circumflex (LCx): 100% ostial occlusion with faint left to left collaterals.  Right coronary artery (RCA): the RCA is a large dominant branch. The stent in the proximal vessel is widely patent. There is a 60% stenosis in the mid PDA. The third PLOM is moderate in size with a long 90-95% stenosis prior to distal arborization.   Subsequently he underwent emergent coronary artery bypass grafting with a LIMA to LAD and SVG to OM branch. He is noted to have mild aortic sclerosis versus stenosis, however the valve was not severe enough to be addressed. Since discharge he is more active and is starting cardiac rehabilitation today.  [name] for follow-up. Overall he is feeling well and continues to improve in his ambulation and stamina. He denies any chest pain or shortness of breath. He is rehabilitating well. Recent laboratory work shows excellent cholesterol control and total cholesterol 130, HDL 42 LDL 61 and triglycerides 137. His only concern is the cost of medications.  I saw [name] today in the office. He denies any chest pain or worsening shortness of breath. Unfortunately he's had about several weeks of diarrhea which started when he went to the [facility] area. It was not felt to be infectious any seeing a gastroenterologist about this. Otherwise he is without  cardiac complaints. Blood pressure today was low at 114/52. He's managed to lose only a couple of pounds but generally his appetite stools been pretty good despite the diarrhea. He is planning on having an upcoming injection and I received a request to hold his Plavix for 5 days prior to that. He  is currently holding it will have his injection on Monday. Based on this I reviewed his records and I do not see a clear ongoing indication for the Plavix in addition to aspirin. Now that he's been surgically revascularized.   03/04/2017  [name] for follow-up. Recently he's had some dizziness and apparently had lower blood pressure. He backed off on his lisinopril to 2.5 mg daily, or at least he says he is taking a half tablet. Will try to figure out what the dose was. EKG today shows sinus bradycardia with either a first degree AV block of great significance or possibly atrial fibrillation. It's difficult to see consistent P waves with normal PP intervals. The RR intervals are slightly irregular. He has had sinus with a long first-degree AV block of 340 ms in the past. This is important to determine as he has had prior stroke and would necessitate anticoagulation.  03/23/2018  [name] for follow-up.  He has no specific complaints.  He continues to have a little instability with his gait.  He is using a cane today but has a walker at home.  Recently he has had the best blood sugars that he has had in a while.  He started seeing Randall Wilkins, who added Victoza.  He is also on [place] and Humalog.  He was in a clinical trial with [employer], and is to be considered given his history of coronary disease and diabetes for mortality benefit.  I will defer to Randall Wilkins.  Blood pressure is well controlled today.  Weight remains excessive.  EKG personally reviewed shows sinus rhythm with PVCs for which he is unaware of.  02/07/2019  Randall Wilkins returns today for follow-up.  He is done well and has no new complaints.  Recently seen by Dr. Claiborne Billings and adjustments were made to his CPAP.  He is reportedly 100% compliant.  He was noted to have a type I winky block on EKG a couple weeks ago and his Toprol-XL was reduced from 37.5 to 25 mg.  He seems to be tolerating this however blood pressures  accordingly gone up.  Blood pressure today was 155/71 and home blood pressures are in the 150s.  03/13/2020  [place] is seen today in follow-up.  He is accompanied by his wife.  He scheduled to see Dr. Claiborne Billings in a couple weeks.  Recently he has been struggling with shortness of breath and fatigue.  He saw his primary care provider and diagnosed with a pneumonia.  He has been on antibiotics and does seem to be improving.  He has somewhat of a productive cough.  An EKG was performed today and shows atrial fibrillation controlled ventricular response.  This is newly documented.  He does not have known history of this although had had a prior stroke.  He is on low-dose aspirin.  05/03/2020  Alex is seen today in follow-up.  He just got [place] from the beach.  He remains in A. fib.  Fortunately his pneumonia is not resolving.  Repeat imaging still suggest multifocal infiltrates and his PCP apparently is going to refer him to pulmonary.  I suspect he may end up getting bronchoscopy  or other procedures.  This will complicate things if he were to have a cardioversion, particularly I would not want to stop his anticoagulation to allow for those procedures within a month after it.  At this point he remains in A. fib today but is rate controlled.  He has been fatigued again I think this is related his A. fib and his pneumonias.  He is essentially nonambulatory and difficult for his wife to push around.  They have asked about a powered wheelchair.  07/08/2020  Phill[place] returns today for follow-up.  He appears to be an sinus rhythm with Mobitz 1 AV block.  He had this previously and has had some intermittent A. fib however there is no indication for cardioversion.  He is anticoagulated on Eliquis.  His breathing is worsened and recently had a super D CT scan of the chest ordered by Dr. Lamonte Sakai.  Unfortunate this shows a large 5 cm cavitary lung mass in the right lower lobe as well as some persistent lung mass in the left upper  lobe.  Is not clear to me whether this is tumor versus a possible pneumonic process.  He also reported that they had had some remodeling done in her house and they found extensive amounts of mold in the kitchen.  PMHx:  Past Medical History:  Diagnosis Date  . Aortic stenosis, mild 11/14/2013  . Aortic valve sclerosis 03/29/2015  . Bilateral leg edema 05/21/2014  . CAD (coronary artery disease)   . Chronic diastolic congestive heart failure ([place])   . Chronic kidney disease (CKD), stage III (moderate)   . Diabetes 1.5, managed as type 1 ([place]) 02/04/2013  . Diabetic peripheral neuropathy associated with type 1 diabetes mellitus ([place]) 11/14/2013  . Dysphagia   . Dyspnea on exertion 03/21/2015  . Exogenous obesity   . Gout   . Heart attack ([place])   . History of nuclear stress test 07/2011   dipyridamole; fixed inferolateral defect, worse at stress than rest; no reversible ischemia; low risk scan   . Hyperlipidemia   . Hypertension   . Insulin dependent diabetes mellitus   . Left foot drop   . Left main coronary artery disease 03/28/2015  . Memory loss   . Obesity (BMI 30-39.9) 11/14/2013  . Obstructive sleep apnea 03/21/2015  . OSA on CPAP    uses a cpap  . Peptic ulcer with hemorrhage 03/28/2015  . Peripheral neuropathy   . Rhabdomyolysis   . 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 Stroke ([place])    L patietal with small scattered lacunar infarcts  . Thrombocytopenia ([place]) 03/21/2015  . Venous insufficiency   . Weakness generalized 03/21/2015    Past Surgical History:  Procedure Laterality Date  . BACK SURGERY  2002   lumbosacral. 11 back surgeries total  . Carotid Doppler  03/2013   bilat bulb/prox ICAs - mild amount of fibrous plaque with no evidence of diameter reduction  . CARPAL TUNNEL RELEASE Bilateral 08/09/2014   Procedure: BILATERAL CARPAL TUNNEL RELEASE;  Surgeon: Daryll Brod, MD;  Location: [place];  Service: Orthopedics;   Laterality: Bilateral;  ANESTHESIA:  IV REGIONAL BIL FAB  . CHOLECYSTECTOMY    . COLONOSCOPY    . CORONARY ANGIOPLASTY  10/13/1996  . CORONARY ANGIOPLASTY  09/21/1989   emergency PTCA  . CORONARY ANGIOPLASTY  10/13/1996   Multi-Link diagonal & OD stenting (Dr. Marella Chimes)  . CORONARY ANGIOPLASTY  12/03/1997   disease of mid DX-1 ~50% & in mid PLA & PDA (distal lesions) (Dr. Marella Chimes)   . CORONARY ANGIOPLASTY  10/14/1999   progression of disease distal PLA & PDA; progression of disease prox RCA - moderate (Dr. Marella Chimes)   . CORONARY ANGIOPLASTY WITH STENT PLACEMENT  04/04/2004   4.0x22m non-DES (thrombectomy via AngioJet) to RCA for high grade stenosis (Dr. RMarella Chimes  . CORONARY ARTERY BYPASS GRAFT N/A 03/29/2015   Procedure: CORONARY ARTERY BYPASS GRAFTING TIMES TWO USING LEFT INTERNAL MAMMARY ARTERY AND RIGHT LEG GREATER SAPHENOUS VEIN HARVESTED ENDOSCOPICALLY.;  Surgeon: CRexene Alberts MD;  [place]  Service: Open Heart Surgery;  Laterality: N/A;  . ESOPHAGOGASTRODUODENOSCOPY N/A 03/27/2015   Procedure: ESOPHAGOGASTRODUODENOSCOPY (EGD);  Surgeon: MClarene Essex MD;  [facility]  Service: Endoscopy;  Laterality: N/A;  possible dilation  . LEFT HEART CATHETERIZATION WITH CORONARY ANGIOGRAM N/A 03/28/2015   Procedure: LEFT HEART CATHETERIZATION WITH CORONARY ANGIOGRAM;  Surgeon: Peter M JMartinique MD;  [facility];  Service: Cardiovascular;  Laterality: N/A;  . SINUS ENDO W/FUSION    . TONSILLECTOMY    . TRANSTHORACIC ECHOCARDIOGRAM  08/08/2013   EF 55-60%, mild conc hypertrophy, grade 1 diastolic dysfunction; AV with mild stenosis; LA & RA mildly dilated    FAMHx:  Family History  Problem Relation Age of Onset  . Heart disease Mother   . Coronary artery disease Father   . Cancer Maternal Grandmother   . Heart Problems Maternal Grandfather   . Diabetes Son        borderline     SOCHx:   reports that he quit smoking about 46 years ago. His  smoking use included cigarettes. He has a 40.00 pack-year smoking history. He has never used smokeless tobacco. He reports that he does not drink alcohol and does not use drugs.  ALLERGIES:  Allergies  Allergen Reactions  . Actos [Pioglitazone] Swelling  . Metformin And Related Nausea Only  . Niaspan [Niacin Er] Itching and Rash    ROS: Pertinent items noted in HPI and remainder of comprehensive ROS otherwise negative.  HOME MEDS: Current Outpatient Medications  Medication Sig Dispense Refill  . allopurinol (ZYLOPRIM) 300 MG tablet Take 1 tablet by mouth daily. Take 1 tab by my mouth daily  2  . amitriptyline (ELAVIL) 50 MG tablet Take 50 mg by mouth daily.    .Marland Kitchenapixaban (ELIQUIS) 5 MG TABS tablet Take 1 tablet (5 mg total) by mouth 2 (two) times daily. 60 tablet 11  . aspirin 81 MG tablet Take 1 tablet (81 mg total) by mouth daily.    . Blood Glucose Monitoring Suppl (CONTOUR NEXT MONITOR) w/Device KIT 1 Device by Does not apply route 3 (three) times daily. Use to check blood sugars 3 times daily. Dx Code E13.9 1 kit 2  . colchicine 0.6 MG tablet Take 0.6 mg by mouth daily as needed.    . donepezil (ARICEPT) 10 MG tablet TAKE ONE TABLET DAILY.  PLEASE CALL [phone] SCHEDULE FOLLOW UP. 90 tablet 0  . furosemide (LASIX) 40 MG tablet Take 80 mg by mouth daily.    .Marland Kitchenglucose blood (CONTOUR NEXT TEST) test strip USE 1 STRIP TO CHECK GLUCOSE 4 TIMES DAILY 150 each 0  . insulin degludec (TRESIBA FLEXTOUCH) 200 UNIT/ML FlexTouch Pen Inject 50 Units into the skin daily.     . insulin lispro (HUMALOG) 100 UNIT/ML cartridge Inject 5-15 Units into the skin 3 (three) times daily with  meals.    . Insulin Pen Needle (EASY COMFORT PEN NEEDLES) 33G X 4 MM MISC 1 each by Does not apply route See admin instructions. Use to inject insulin 5 times daily. 150 each 3  . levothyroxine (SYNTHROID, LEVOTHROID) 75 MCG tablet Take 75 mcg by mouth daily before breakfast.   1  . lisinopril (ZESTRIL) 10 MG  tablet Take 1 tablet (10 mg total) by mouth daily. 90 tablet 2  . metFORMIN (GLUCOPHAGE-XR) 750 MG 24 hr tablet TAKE 1 TABLET WITH DINNER 90 tablet 0  . metoprolol succinate (TOPROL-XL) 25 MG 24 hr tablet Take 1 tablet (25 mg total) by mouth daily. 90 tablet 3  . NON FORMULARY Inhale 1 application into the lungs at bedtime. CPAP    . potassium chloride SA (K-DUR,KLOR-CON) 20 MEQ tablet Take 20 mEq by mouth 2 (two) times daily.     . rosuvastatin (CRESTOR) 20 MG tablet Take 20 mg by mouth daily.    . Tamsulosin HCl (FLOMAX) 0.4 MG CAPS Take 0.4 mg by mouth daily.     Marland Kitchen torsemide (DEMADEX) 10 MG tablet Take 10 mg by mouth daily.      No current facility-administered medications for this visit.    LABS/IMAGING: No results found for this or any previous visit (from the past 48 hour(s)). No results found.  VITALS: BP (!) 177/78   Pulse 62   Ht 5' 7" (1.702 m)   Wt (!) 246 lb 9.6 oz (111.9 kg)   SpO2 95%   BMI 38.62 kg/m   EXAM: Deferred  EKG: Sinus rhythm with Mobitz 1 AV block, LAFB at 62-personally reviewed  ASSESSMENT: 1. New onset atrial fibrillation - CHADSVASC score of 6 2. Recent multifocal pneumonia 3. Coronary artery disease status post BMS to the RCA in 2005, recent CABG 2 (LIMA to LAD - for critical left main disease, SVG to OM1)- 2016 4. Obesity 5. Hypertension-controlled 6. Dyslipidemia 7. Prior stroke 8. Diabetic neuropathy 9. Diabetic nephropathy - left foot drop 10. Type 1 diabetes on insulin 11. LE edema 12. Mild aortic stenosis  PLAN: 1.   [name] was found to have a right lower lobe cavitary lesion which is quite large.  There is also a left upper lobe mass as well.  He has follow-up with Dr. Lamonte Sakai in a couple weeks.  They had not heard about the results of the CT scan yet but I felt important to share with them.  I did not try to make a diagnosis based on that as I think he should follow-up with Dr. Lamonte Sakai to discuss further work-up.  Rhythm is sinus  today with winky block and no evidence of recurrent A. fib but would I would recommend remaining on Eliquis.  He could hold this for 2 to 3 days prior to an elective bronchoscopy if necessary.  Follow-up with me in 6 months.  Pixie Casino, MD, [facility], [place] Director of the Advanced Lipid Disorders &  Cardiovascular Risk Reduction Clinic Diplomate of the American Board of Clinical Lipidology Attending Cardiologist  Direct Dial: [phone]  Fax: [phone]  Website:  [email or web address]  Nadean Corwin  07/08/2020, 11:12 AM

## 2020-07-11 DIAGNOSIS — E1149 Type 2 diabetes mellitus with other diabetic neurological complication: Secondary | ICD-10-CM | POA: Diagnosis not present

## 2020-07-11 DIAGNOSIS — I10 Essential (primary) hypertension: Secondary | ICD-10-CM | POA: Diagnosis not present

## 2020-07-11 DIAGNOSIS — N183 Chronic kidney disease, stage 3 unspecified: Secondary | ICD-10-CM | POA: Diagnosis not present

## 2020-07-11 DIAGNOSIS — E1129 Type 2 diabetes mellitus with other diabetic kidney complication: Secondary | ICD-10-CM | POA: Diagnosis not present

## 2020-07-22 ENCOUNTER — Telehealth: Payer: Self-pay | Admitting: Emergency Medicine

## 2020-07-22 ENCOUNTER — Other Ambulatory Visit: Payer: Self-pay

## 2020-07-22 ENCOUNTER — Ambulatory Visit: Payer: Medicare Other | Admitting: Emergency Medicine

## 2020-07-22 ENCOUNTER — Encounter: Payer: Self-pay | Admitting: Emergency Medicine

## 2020-07-22 DIAGNOSIS — R918 Other nonspecific abnormal finding of lung field: Secondary | ICD-10-CM

## 2020-07-22 DIAGNOSIS — Z5181 Encounter for therapeutic drug level monitoring: Secondary | ICD-10-CM

## 2020-07-22 LAB — CBC
HCT: 47.6 % (ref 39.0–52.0)
Hemoglobin: 15.8 g/dL (ref 13.0–17.0)
MCHC: 33.1 g/dL (ref 30.0–36.0)
MCV: 91.7 fl (ref 78.0–100.0)
Platelets: 143 10*3/uL — ABNORMAL LOW (ref 150.0–400.0)
RBC: 5.19 Mil/uL (ref 4.22–5.81)
RDW: 16.4 % — ABNORMAL HIGH (ref 11.5–15.5)
WBC: 9.5 10*3/uL (ref 4.0–10.5)

## 2020-07-22 LAB — BASIC METABOLIC PANEL
BUN: 21 mg/dL (ref 6–23)
CO2: 31 mEq/L (ref 19–32)
Calcium: 9.6 mg/dL (ref 8.4–10.5)
Chloride: 104 mEq/L (ref 96–112)
Creatinine, Ser: 1.64 mg/dL — ABNORMAL HIGH (ref 0.40–1.50)
GFR: 40.67 mL/min — ABNORMAL LOW (ref >60.00)
Glucose, Bld: 123 mg/dL — ABNORMAL HIGH (ref 70–99)
Potassium: 4.2 mEq/L (ref 3.5–5.1)
Sodium: 140 mEq/L (ref 135–145)

## 2020-07-22 LAB — PROTIME-INR
INR: 1.4 ratio — ABNORMAL HIGH (ref 0.8–1.0)
Prothrombin Time: 15.7 s — ABNORMAL HIGH (ref 9.6–13.1)

## 2020-07-22 NOTE — Telephone Encounter (Signed)
Pt has been scheduled at [facility] on 8/24 at 7:30 for ENB/EBUS.  Covid test 8/21 at 9:15.  I gave info to pt's wife.  He will come to the office next Monday (8/16) to get labs.  I told he he will need to stop Eliquis 3 days prior to procedure and Aspirin 2 days prior.  I have called [facility] about getting disk for super D.

## 2020-07-22 NOTE — Addendum Note (Signed)
Addended by: Gavin Potters R on: 07/22/2020 12:20 PM   Modules accepted: Orders

## 2020-07-22 NOTE — Patient Instructions (Addendum)
We will work on setting up bronchoscopy as soon as possible.  We will confirm with Dr Debara Pickett that it is ok to proceed with bronchoscopy/ You will need to stop your Eliquis 3 days before the bronchoscopy.  Blood work today Follow with Dr Lamonte Sakai in 1 month

## 2020-07-22 NOTE — Assessment & Plan Note (Signed)
Bilateral lung opacities that did not clear with antibiotics and persist on the CT scan from 06/23/20.  I am concerned about malignancy.  He also has a significant fungus exposure and is at risk for invasive fungal disease.  I believe he needs bronchoscopy is the best diagnostic test to evaluate for both.  He has been evaluated by Dr. Debara Pickett and its okay to stop his anticoagulation for 3 days.  I will confirm also that he is okay for general anesthesia.  I will try to get this scheduled on 8/17

## 2020-07-22 NOTE — Addendum Note (Signed)
Addended by: Collene Gobble on: 07/22/2020 01:29 PM   Modules accepted: Orders

## 2020-07-22 NOTE — Addendum Note (Signed)
Addended by: Suzzanne Cloud E on: 07/22/2020 12:18 PM   Modules accepted: Orders

## 2020-07-22 NOTE — Progress Notes (Signed)
 Subjective:    Patient ID: Randall Wilkins, male    DOB: 01/16/1941, 79 y.o.   MRN: [id number]  HPI 79-year-old former smoker (40 pack years), with diabetes, hypertension, hyperlipidemia, aortic stenosis, CAD/CABG, CVA, A fib, OSA on CPAP. He is referred today for evaluation of dyspnea on exertion, cough, abnormal chest imaging  He reports that has been dealing with cough, intermittently productive of mucus since the beginning of 2021.  Has been treated with Mucinex, antibiotics in January.  He had a chest x-ray 03/08/2020 that reportedly showed bilateral pulmonary infiltrates.  He was treated with antibiotics for bacterial pneumonia, slow to to respond and was treated with serial courses - poorly tolerated levaquin. His CT scan of the chest 03/27/2020 was performed, showed dense right upper lobe and right lower lobe, left upper lobe infiltrates with a small associated right pleural effusion, possible left infrahilar lymph node.  He had a repeat chest x-ray done 04/29/2020 which I have reviewed, showed persistent bilateral pulmonary infiltrates.   No hx of cancer, had an uncle w lung CA.   He is followed by Dr Hilty  ROV 07/22/20 --79-year-old former smoker with diabetes, hypertension, hyperlipidemia, left ear, CAD/CABG, OSA on CPAP.  I saw him last month for exertional dyspnea, cough, pulmonary infiltrates on chest imaging following pneumonia in March and April of this year.  Bilateral rounded opacities.  We repeated a CT scan of the chest 06/23/2020 and I have reviewed.  This shows right lower lobe rounded opacity now with some cavitation, persistent large left upper lobe rounded opacity probable mass and interval enlargement of mediastinal hilar lymphadenopathy.  All very concerning for malignancy. He has been sleeping more. His exertional SOB persists, able to walk only 25-50 feet. His wife reports a mold exposure in the home - discovered in April.    Review of Systems As per HPI  Past Medical  History:  Diagnosis Date  . Aortic stenosis, mild 11/14/2013  . Aortic valve sclerosis 03/29/2015  . Bilateral leg edema 05/21/2014  . CAD (coronary artery disease)   . Chronic diastolic congestive heart failure (HCC)   . Chronic kidney disease (CKD), stage III (moderate)   . 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 Diabetic peripheral neuropathy associated with type 1 diabetes mellitus (HCC) 11/14/2013  . Dysphagia   . Dyspnea on exertion 03/21/2015  . Exogenous obesity   . Gout   . Heart attack (HCC)   . History of nuclear stress test 07/2011   dipyridamole; fixed inferolateral defect, worse at stress than rest; no reversible ischemia; low risk scan   . Hyperlipidemia   . Hypertension   . Insulin dependent diabetes mellitus   . Left foot drop   . Left main coronary artery disease 03/28/2015  . Memory loss   . Obesity (BMI 30-39.9) 11/14/2013  . Obstructive sleep apnea 03/21/2015  . OSA on CPAP    uses a cpap  . Peptic ulcer with hemorrhage 03/28/2015  . Peripheral neuropathy   . Rhabdomyolysis   . 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 Stroke (HCC)    L patietal with small scattered lacunar infarcts  . Thrombocytopenia (HCC) 03/21/2015  . Venous insufficiency   . Weakness generalized 03/21/2015     Family History  Problem Relation Age of Onset  . Heart disease Mother   . Coronary artery disease Father   . Cancer Maternal Grandmother   .   Heart Problems Maternal Grandfather   . Diabetes Son        borderline      Social History   Socioeconomic History  . Marital status: Married    Spouse name: Not on file  . Number of children: 3  . Years of education: 58  . Highest education level: Not on file  Occupational History  . Not on file  Tobacco Use  . Smoking status: Former Smoker    Packs/day: 2.00    Years: 20.00    Pack years: 40.00    Types: Cigarettes    Quit date: 01/25/1974    Years since quitting: 46.5  . Smokeless  tobacco: Never Used  Vaping Use  . Vaping Use: Never used  Substance and Sexual Activity  . Alcohol use: No    Alcohol/week: 0.0 standard drinks  . Drug use: No  . Sexual activity: Not on file  Other Topics Concern  . Not on file  Social History Narrative   Lives with wife.    Social Determinants of Health   Financial Resource Strain:   . Difficulty of Paying Living Expenses:   Food Insecurity:   . Worried About Charity fundraiser in the Last Year:   . Arboriculturist in the Last Year:   Transportation Needs:   . Film/video editor (Medical):   Marland Kitchen Lack of Transportation (Non-Medical):   Physical Activity:   . Days of Exercise per Week:   . Minutes of Exercise per Session:   Stress:   . Feeling of Stress :   Social Connections:   . Frequency of Communication with Friends and Family:   . Frequency of Social Gatherings with Friends and Family:   . Attends Religious Services:   . Active Member of Clubs or Organizations:   . Attends Archivist Meetings:   Marland Kitchen Marital Status:   Intimate Partner Violence:   . Fear of Current or Ex-Partner:   . Emotionally Abused:   Marland Kitchen Physically Abused:   . Sexually Abused:       Allergies  Allergen Reactions  . Actos [Pioglitazone] Swelling  . Metformin And Related Nausea Only  . Niaspan [Niacin Er] Itching and Rash     Outpatient Medications Prior to Visit  Medication Sig Dispense Refill  . allopurinol (ZYLOPRIM) 300 MG tablet Take 1 tablet by mouth daily. Take 1 tab by my mouth daily  2  . amitriptyline (ELAVIL) 50 MG tablet Take 50 mg by mouth daily.    Marland Kitchen apixaban (ELIQUIS) 5 MG TABS tablet Take 1 tablet (5 mg total) by mouth 2 (two) times daily. 60 tablet 11  . aspirin 81 MG tablet Take 1 tablet (81 mg total) by mouth daily.    . Blood Glucose Monitoring Suppl (CONTOUR NEXT MONITOR) w/Device KIT 1 Device by Does not apply route 3 (three) times daily. Use to check blood sugars 3 times daily. Dx Code E13.9 1 kit 2  .  colchicine 0.6 MG tablet Take 0.6 mg by mouth daily as needed.    . donepezil (ARICEPT) 10 MG tablet TAKE ONE TABLET DAILY.  PLEASE CALL [phone] TO SCHEDULE FOLLOW UP. 90 tablet 0  . furosemide (LASIX) 40 MG tablet Take 80 mg by mouth daily.    Marland Kitchen glucose blood (CONTOUR NEXT TEST) test strip USE 1 STRIP TO CHECK GLUCOSE 4 TIMES DAILY 150 each 0  . insulin degludec (TRESIBA FLEXTOUCH) 200 UNIT/ML FlexTouch Pen Inject 50 Units into the skin  daily.     . insulin lispro (HUMALOG) 100 UNIT/ML cartridge Inject 5-15 Units into the skin 3 (three) times daily with meals.    . Insulin Pen Needle (EASY COMFORT PEN NEEDLES) 33G X 4 MM MISC 1 each by Does not apply route See admin instructions. Use to inject insulin 5 times daily. 150 each 3  . levothyroxine (SYNTHROID, LEVOTHROID) 75 MCG tablet Take 75 mcg by mouth daily before breakfast.   1  . lisinopril (ZESTRIL) 10 MG tablet Take 1 tablet (10 mg total) by mouth daily. 90 tablet 2  . metFORMIN (GLUCOPHAGE-XR) 750 MG 24 hr tablet TAKE 1 TABLET WITH DINNER 90 tablet 0  . metoprolol succinate (TOPROL-XL) 25 MG 24 hr tablet Take 1 tablet (25 mg total) by mouth daily. 90 tablet 3  . NON FORMULARY Inhale 1 application into the lungs at bedtime. CPAP    . potassium chloride SA (K-DUR,KLOR-CON) 20 MEQ tablet Take 20 mEq by mouth 2 (two) times daily.     . rosuvastatin (CRESTOR) 20 MG tablet Take 20 mg by mouth daily.    . Tamsulosin HCl (FLOMAX) 0.4 MG CAPS Take 0.4 mg by mouth daily.     Marland Kitchen torsemide (DEMADEX) 10 MG tablet Take 10 mg by mouth daily.      No facility-administered medications prior to visit.        Objective:   Physical Exam Vitals:   07/22/20 1141  BP: 128/70  Pulse: 84  Temp: 98.2 F (36.8 C)  TempSrc: Temporal  SpO2: 96%  Weight: 242 lb 9.6 oz (110 kg)  Height: 5' 7" (1.702 m)   Gen: Pleasant, obese, in no distress,  normal affect  ENT: No lesions,  mouth clear,  oropharynx clear, no postnasal drip  Neck: No JVD, no  stridor  Lungs: No use of accessory muscles, few scattered rhonchi, R basilar insp crackles.   Cardiovascular: RRR, heart sounds normal, no murmur or gallops, no peripheral edema  Musculoskeletal: No deformities, brace on his LLE  Neuro: alert, awake, non focal  Skin: Warm, no lesions or rash       Assessment & Plan:  Lung mass Bilateral lung opacities that did not clear with antibiotics and persist on the CT scan from 06/23/20.  I am concerned about malignancy.  He also has a significant fungus exposure and is at risk for invasive fungal disease.  I believe he needs bronchoscopy is the best diagnostic test to evaluate for both.  He has been evaluated by Dr. Debara Pickett and its okay to stop his anticoagulation for 3 days.  I will confirm also that he is okay for general anesthesia.  I will try to get this scheduled on 8/17  Baltazar Apo, MD, PhD 07/22/2020, 12:08 PM [facility] [phone] or if no answer [phone]

## 2020-07-22 NOTE — Telephone Encounter (Signed)
Pt had CT done at [place].  Spoke to CT at [facility] they said to let RB know they will have disk there for him to pick up.

## 2020-07-22 NOTE — H&P (View-Only) (Signed)
Subjective:    Patient ID: Randall Wilkins, male    DOB: 04/11/1941, 79 y.o.   MRN: [id number]  HPI 79 year old former smoker (40 pack years), with diabetes, hypertension, hyperlipidemia, aortic stenosis, CAD/CABG, CVA, A fib, OSA on CPAP. He is referred today for evaluation of dyspnea on exertion, cough, abnormal chest imaging  He reports that has been dealing with cough, intermittently productive of mucus since the beginning of 2021.  Has been treated with Mucinex, antibiotics in January.  He had a chest x-ray 03/08/2020 that reportedly showed bilateral pulmonary infiltrates.  He was treated with antibiotics for bacterial pneumonia, slow to to respond and was treated with serial courses - poorly tolerated levaquin. His CT scan of the chest 03/27/2020 was performed, showed dense right upper lobe and right lower lobe, left upper lobe infiltrates with a small associated right pleural effusion, possible left infrahilar lymph node.  He had a repeat chest x-ray done 04/29/2020 which I have reviewed, showed persistent bilateral pulmonary infiltrates.   No hx of cancer, had an uncle w lung CA.   He is followed by Dr Debara Pickett  ROV 07/22/20 --79 year old former smoker with diabetes, hypertension, hyperlipidemia, left ear, CAD/CABG, OSA on CPAP.  I saw him last month for exertional dyspnea, cough, pulmonary infiltrates on chest imaging following pneumonia in March and April of this year.  Bilateral rounded opacities.  We repeated a CT scan of the chest 06/23/2020 and I have reviewed.  This shows right lower lobe rounded opacity now with some cavitation, persistent large left upper lobe rounded opacity probable mass and interval enlargement of mediastinal hilar lymphadenopathy.  All very concerning for malignancy. He has been sleeping more. His exertional SOB persists, able to walk only 25-50 feet. His wife reports a mold exposure in the home - discovered in April.    Review of Systems As per HPI  Past Medical  History:  Diagnosis Date  . Aortic stenosis, mild 11/14/2013  . Aortic valve sclerosis 03/29/2015  . Bilateral leg edema 05/21/2014  . CAD (coronary artery disease)   . Chronic diastolic congestive heart failure ([place])   . Chronic kidney disease (CKD), stage III (moderate)   . Diabetes 1.5, managed as type 1 ([place]) 02/04/2013  . Diabetic peripheral neuropathy associated with type 1 diabetes mellitus ([place]) 11/14/2013  . Dysphagia   . Dyspnea on exertion 03/21/2015  . Exogenous obesity   . Gout   . Heart attack ([place])   . History of nuclear stress test 07/2011   dipyridamole; fixed inferolateral defect, worse at stress than rest; no reversible ischemia; low risk scan   . Hyperlipidemia   . Hypertension   . Insulin dependent diabetes mellitus   . Left foot drop   . Left main coronary artery disease 03/28/2015  . Memory loss   . Obesity (BMI 30-39.9) 11/14/2013  . Obstructive sleep apnea 03/21/2015  . OSA on CPAP    uses a cpap  . Peptic ulcer with hemorrhage 03/28/2015  . Peripheral neuropathy   . Rhabdomyolysis   . 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 Stroke ([place])    L patietal with small scattered lacunar infarcts  . Thrombocytopenia ([place]) 03/21/2015  . Venous insufficiency   . Weakness generalized 03/21/2015     Family History  Problem Relation Age of Onset  . Heart disease Mother   . Coronary artery disease Father   . Cancer Maternal Grandmother   .  Heart Problems Maternal Grandfather   . Diabetes Son        borderline      Social History   Socioeconomic History  . Marital status: Married    Spouse name: Not on file  . Number of children: 3  . Years of education: 11  . Highest education level: Not on file  Occupational History  . Not on file  Tobacco Use  . Smoking status: Former Smoker    Packs/day: 2.00    Years: 20.00    Pack years: 40.00    Types: Cigarettes    Quit date: 01/25/1974    Years since quitting: 46.5  . Smokeless  tobacco: Never Used  Vaping Use  . Vaping Use: Never used  Substance and Sexual Activity  . Alcohol use: No    Alcohol/week: 0.0 standard drinks  . Drug use: No  . Sexual activity: Not on file  Other Topics Concern  . Not on file  Social History Narrative   Lives with wife.    Social Determinants of Health   Financial Resource Strain:   . Difficulty of Paying Living Expenses:   Food Insecurity:   . Worried About Charity fundraiser in the Last Year:   . Arboriculturist in the Last Year:   Transportation Needs:   . Film/video editor (Medical):   Marland Kitchen Lack of Transportation (Non-Medical):   Physical Activity:   . Days of Exercise per Week:   . Minutes of Exercise per Session:   Stress:   . Feeling of Stress :   Social Connections:   . Frequency of Communication with Friends and Family:   . Frequency of Social Gatherings with Friends and Family:   . Attends Religious Services:   . Active Member of Clubs or Organizations:   . Attends Archivist Meetings:   Marland Kitchen Marital Status:   Intimate Partner Violence:   . Fear of Current or Ex-Partner:   . Emotionally Abused:   Marland Kitchen Physically Abused:   . Sexually Abused:       Allergies  Allergen Reactions  . Actos [Pioglitazone] Swelling  . Metformin And Related Nausea Only  . Niaspan [Niacin Er] Itching and Rash     Outpatient Medications Prior to Visit  Medication Sig Dispense Refill  . allopurinol (ZYLOPRIM) 300 MG tablet Take 1 tablet by mouth daily. Take 1 tab by my mouth daily  2  . amitriptyline (ELAVIL) 50 MG tablet Take 50 mg by mouth daily.    Marland Kitchen apixaban (ELIQUIS) 5 MG TABS tablet Take 1 tablet (5 mg total) by mouth 2 (two) times daily. 60 tablet 11  . aspirin 81 MG tablet Take 1 tablet (81 mg total) by mouth daily.    . Blood Glucose Monitoring Suppl (CONTOUR NEXT MONITOR) w/Device KIT 1 Device by Does not apply route 3 (three) times daily. Use to check blood sugars 3 times daily. Dx Code E13.9 1 kit 2  .  colchicine 0.6 MG tablet Take 0.6 mg by mouth daily as needed.    . donepezil (ARICEPT) 10 MG tablet TAKE ONE TABLET DAILY.  PLEASE CALL [phone] TO SCHEDULE FOLLOW UP. 90 tablet 0  . furosemide (LASIX) 40 MG tablet Take 80 mg by mouth daily.    Marland Kitchen glucose blood (CONTOUR NEXT TEST) test strip USE 1 STRIP TO CHECK GLUCOSE 4 TIMES DAILY 150 each 0  . insulin degludec (TRESIBA FLEXTOUCH) 200 UNIT/ML FlexTouch Pen Inject 50 Units into the skin  daily.     . insulin lispro (HUMALOG) 100 UNIT/ML cartridge Inject 5-15 Units into the skin 3 (three) times daily with meals.    . Insulin Pen Needle (EASY COMFORT PEN NEEDLES) 33G X 4 MM MISC 1 each by Does not apply route See admin instructions. Use to inject insulin 5 times daily. 150 each 3  . levothyroxine (SYNTHROID, LEVOTHROID) 75 MCG tablet Take 75 mcg by mouth daily before breakfast.   1  . lisinopril (ZESTRIL) 10 MG tablet Take 1 tablet (10 mg total) by mouth daily. 90 tablet 2  . metFORMIN (GLUCOPHAGE-XR) 750 MG 24 hr tablet TAKE 1 TABLET WITH DINNER 90 tablet 0  . metoprolol succinate (TOPROL-XL) 25 MG 24 hr tablet Take 1 tablet (25 mg total) by mouth daily. 90 tablet 3  . NON FORMULARY Inhale 1 application into the lungs at bedtime. CPAP    . potassium chloride SA (K-DUR,KLOR-CON) 20 MEQ tablet Take 20 mEq by mouth 2 (two) times daily.     . rosuvastatin (CRESTOR) 20 MG tablet Take 20 mg by mouth daily.    . Tamsulosin HCl (FLOMAX) 0.4 MG CAPS Take 0.4 mg by mouth daily.     Marland Kitchen torsemide (DEMADEX) 10 MG tablet Take 10 mg by mouth daily.      No facility-administered medications prior to visit.        Objective:   Physical Exam Vitals:   07/22/20 1141  BP: 128/70  Pulse: 84  Temp: 98.2 F (36.8 C)  TempSrc: Temporal  SpO2: 96%  Weight: 242 lb 9.6 oz (110 kg)  Height: 5' 7" (1.702 m)   Gen: Pleasant, obese, in no distress,  normal affect  ENT: No lesions,  mouth clear,  oropharynx clear, no postnasal drip  Neck: No JVD, no  stridor  Lungs: No use of accessory muscles, few scattered rhonchi, R basilar insp crackles.   Cardiovascular: RRR, heart sounds normal, no murmur or gallops, no peripheral edema  Musculoskeletal: No deformities, brace on his LLE  Neuro: alert, awake, non focal  Skin: Warm, no lesions or rash       Assessment & Plan:  Lung mass Bilateral lung opacities that did not clear with antibiotics and persist on the CT scan from 06/23/20.  I am concerned about malignancy.  He also has a significant fungus exposure and is at risk for invasive fungal disease.  I believe he needs bronchoscopy is the best diagnostic test to evaluate for both.  He has been evaluated by Dr. Debara Pickett and its okay to stop his anticoagulation for 3 days.  I will confirm also that he is okay for general anesthesia.  I will try to get this scheduled on 8/17  Baltazar Apo, MD, PhD 07/22/2020, 12:08 PM [facility] [phone] or if no answer [phone]

## 2020-07-23 ENCOUNTER — Encounter: Payer: Self-pay | Admitting: Neurology

## 2020-07-23 ENCOUNTER — Ambulatory Visit: Payer: Medicare Other | Admitting: Neurology

## 2020-07-23 VITALS — BP 162/80 | HR 77 | Ht 67.0 in | Wt 243.0 lb

## 2020-07-23 DIAGNOSIS — R413 Other amnesia: Secondary | ICD-10-CM

## 2020-07-23 DIAGNOSIS — R269 Unspecified abnormalities of gait and mobility: Secondary | ICD-10-CM | POA: Diagnosis not present

## 2020-07-23 NOTE — Progress Notes (Signed)
PATIENT: Randall Wilkins DOB: [birth year]  REASON FOR VISIT: follow up HISTORY FROM: patient  HISTORY OF PRESENT ILLNESS: Today 07/23/20  HISTORY  He had a past medical history of hypertension, insulin-dependent diabetes, hyperlipidemia, coronary artery disease, diabetic peripheral neuropathy.  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  At baseline he ambulates with a left ankle brace, still active at home, manage his own finances, driving without difficulty, was able to fix home electricity such as lawn mow without difficulty, over the past 6 months, he became forgetful, he tends to repeat himself, forgot name,   He had MRI at [facility], there is evidence of old left parietal subcortical stroke, mild small vessel disease,  echocardiogram July 2013 showed ejection fraction 55%,RVSP of 36, mild aortic valvular stenosis,  BUN and creatinine 23/1 point 38, LDL 45, triglyceride 104,  He has not had further stroke or TIA symtoms. He is currently on Plavix and aspirin with moderate bruising. He is also taking Aricept, tolerating medication well, he has no recurrent strokelike symptoms, he complains of bilateral hand, and feet paresthesia, previously tried and failed different neuropathic pain medications, this including Neurontin, Lyrica, Cymbalta, He was put on amitriptyline around March 2015, 50 mg twice a day, tolerating it well, it does help him moderately, but he still complains of dense numbness of his bilateral fingertips, and toes.  UPDATE 12/11/2014: He is overall doing very well, tolerating current medications, amitriptyline 50 mg every night has helped his sleep, he continued to  ambulate with left ankle brace  UPDATE Dec 22 2017:  He has worsening short term memory loss, he only drive short distance, he is less active,  He has gait abnormality, rely on his cane all the time now, his wife help him with insulin shots,  Update July 23, 2020 SS: Has not been seen since January 2019. Here today with his wife. Lives with his wife. Going through medical problems, had PNA in winter, won't clear up, seeing pulmonology, having bronchoscopy, multiple antibiotics. Has always handled their finances, but his wife noticed discrepancies, a few months ago, she had to take over. Feels it has gotten worse, can't remember appointments or medications. Isn't left alone, due to balance issues, worried he may fall. When he sits he leans to the left, wonder if another stroke? Going on for few months. He isn't very active, sleeps a lot, sits in recliner. Speech is normal. Has hearing problems wears hearing aids, he now talks low. History of AFIB, is on Eliquis. Remains on Aricept for memory. Last fall was in April. Doesn't do any chores at home, needs helps with ADLs. Wearing left AFO brace, using walker. He takes B12. Primary checks thyroid. Sometimes at night, his hands will be shaky with eating, need helps cutting his meat.  REVIEW OF SYSTEMS: Out of a complete 14 system review of symptoms, the patient complains only of the following symptoms, and all other reviewed systems are negative.  Memory loss, balance problems  ALLERGIES: Allergies  Allergen Reactions  . Actos [Pioglitazone] Swelling  . Metformin And Related Nausea Only  . Niaspan [Niacin Er] Itching and Rash    HOME MEDICATIONS: Outpatient Medications Prior to Visit  Medication Sig Dispense Refill  .  liraglutide (VICTOZA) 18 MG/3ML SOPN Inject into the skin.    Marland Kitchen allopurinol (ZYLOPRIM) 300 MG tablet Take 1 tablet by mouth daily. Take 1 tab by my mouth daily  2  . amitriptyline (ELAVIL) 50 MG tablet Take 50 mg by mouth daily.     Marland Kitchen apixaban (ELIQUIS) 5 MG TABS tablet Take 1 tablet (5 mg total) by mouth 2 (two) times daily. 60 tablet 11  . aspirin 81 MG tablet Take 1 tablet (81 mg total) by mouth daily.    . Blood Glucose Monitoring Suppl (CONTOUR NEXT MONITOR) w/Device KIT 1 Device by Does not apply route 3 (three) times daily. Use to check blood sugars 3 times daily. Dx Code E13.9 1 kit 2  . colchicine 0.6 MG tablet Take 0.6 mg by mouth daily as needed.    . donepezil (ARICEPT) 10 MG tablet TAKE ONE TABLET DAILY.  PLEASE CALL [phone] TO SCHEDULE FOLLOW UP. 90 tablet 0  . furosemide (LASIX) 40 MG tablet Take 80 mg by mouth daily.    Marland Kitchen glucose blood (CONTOUR NEXT TEST) test strip USE 1 STRIP TO CHECK GLUCOSE 4 TIMES DAILY 150 each 0  . insulin degludec (TRESIBA FLEXTOUCH) 200 UNIT/ML FlexTouch Pen Inject 50 Units into the skin daily.     . insulin lispro (HUMALOG) 100 UNIT/ML cartridge Inject 5-15 Units into the skin 3 (three) times daily with meals.    . Insulin Pen Needle (EASY COMFORT PEN NEEDLES) 33G X 4 MM MISC 1 each by Does not apply route See admin instructions. Use to inject insulin 5 times daily. 150 each 3  . levothyroxine (SYNTHROID, LEVOTHROID) 75 MCG tablet Take 75 mcg by mouth daily before breakfast.   1  . lisinopril (ZESTRIL) 10 MG tablet Take 1 tablet (10 mg total) by mouth daily. 90 tablet 2  . metFORMIN (GLUCOPHAGE-XR) 750 MG 24 hr tablet TAKE 1 TABLET WITH DINNER 90 tablet 0  . metoprolol succinate (TOPROL-XL) 25 MG 24 hr tablet Take 1 tablet (25 mg total) by mouth daily. 90 tablet 3  . NON FORMULARY Inhale 1 application into the lungs at bedtime. CPAP    . potassium chloride SA (K-DUR,KLOR-CON) 20 MEQ tablet Take 20 mEq by mouth 2 (two) times daily.     . rosuvastatin (CRESTOR) 20 MG tablet Take 20 mg by mouth daily.    . Tamsulosin HCl (FLOMAX) 0.4 MG CAPS Take 0.4 mg by mouth daily.     Marland Kitchen torsemide (DEMADEX) 10 MG tablet Take 10 mg by mouth daily.      No facility-administered medications  prior to visit.    PAST MEDICAL HISTORY: Past Medical History:  Diagnosis Date  . Aortic stenosis, mild 11/14/2013  . Aortic valve sclerosis 03/29/2015  . Bilateral leg edema 05/21/2014  . CAD (coronary artery disease)   . Chronic diastolic congestive heart failure ([place])   . Chronic kidney disease (CKD), stage III (moderate)   . Diabetes 1.5, managed as type 1 ([place]) 02/04/2013  . Diabetic peripheral neuropathy associated with type 1 diabetes mellitus ([place]) 11/14/2013  . Dysphagia   . Dyspnea on exertion 03/21/2015  . Exogenous obesity   . Gout   . Heart attack ([place])   . History of nuclear stress test 07/2011   dipyridamole; fixed inferolateral defect, worse at stress than rest; no reversible ischemia; low risk scan   . Hyperlipidemia   . Hypertension   . Insulin dependent diabetes mellitus   . Left foot drop   . Left main  coronary artery disease 03/28/2015  . Memory loss   . Obesity (BMI 30-39.9) 11/14/2013  . Obstructive sleep apnea 03/21/2015  . OSA on CPAP    uses a cpap  . Peptic ulcer with hemorrhage 03/28/2015  . Peripheral neuropathy   . Rhabdomyolysis   . 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 Stroke ([place])    L patietal with small scattered lacunar infarcts  . Thrombocytopenia ([place]) 03/21/2015  . Venous insufficiency   . Weakness generalized 03/21/2015    PAST SURGICAL HISTORY: Past Surgical History:  Procedure Laterality Date  . BACK SURGERY  2002   lumbosacral. 11 back surgeries total  . Carotid Doppler  03/2013   bilat bulb/prox ICAs - mild amount of fibrous plaque with no evidence of diameter reduction  . CARPAL TUNNEL RELEASE Bilateral 08/09/2014   Procedure: BILATERAL CARPAL TUNNEL RELEASE;  Surgeon: Daryll Brod, MD;  Location: [facility];  Service: Orthopedics;  Laterality: Bilateral;  ANESTHESIA:  IV REGIONAL BIL FAB  . CHOLECYSTECTOMY    . COLONOSCOPY    . CORONARY ANGIOPLASTY  10/13/1996  . CORONARY ANGIOPLASTY   09/21/1989   emergency PTCA  . CORONARY ANGIOPLASTY  10/13/1996   Multi-Link diagonal & OD stenting (Dr. Marella Chimes)  . CORONARY ANGIOPLASTY  12/03/1997   disease of mid DX-1 ~50% & in mid PLA & PDA (distal lesions) (Dr. Marella Chimes)   . CORONARY ANGIOPLASTY  10/14/1999   progression of disease distal PLA & PDA; progression of disease prox RCA - moderate (Dr. Marella Chimes)   . CORONARY ANGIOPLASTY WITH STENT PLACEMENT  04/04/2004   4.0x31m non-DES (thrombectomy via AngioJet) to RCA for high grade stenosis (Dr. RMarella Chimes  . CORONARY ARTERY BYPASS GRAFT N/A 03/29/2015   Procedure: CORONARY ARTERY BYPASS GRAFTING TIMES TWO USING LEFT INTERNAL MAMMARY ARTERY AND RIGHT LEG GREATER SAPHENOUS VEIN HARVESTED ENDOSCOPICALLY.;  Surgeon: CRexene Alberts MD;  [facility]  Service: Open Heart Surgery;  Laterality: N/A;  . ESOPHAGOGASTRODUODENOSCOPY N/A 03/27/2015   Procedure: ESOPHAGOGASTRODUODENOSCOPY (EGD);  Surgeon: MClarene Essex MD;  [facility];  Service: Endoscopy;  Laterality: N/A;  possible dilation  . LEFT HEART CATHETERIZATION WITH CORONARY ANGIOGRAM N/A 03/28/2015   Procedure: LEFT HEART CATHETERIZATION WITH CORONARY ANGIOGRAM;  Surgeon: Peter M JMartinique MD;  [facility];  Service: Cardiovascular;  Laterality: N/A;  . SINUS ENDO W/FUSION    . TONSILLECTOMY    . TRANSTHORACIC ECHOCARDIOGRAM  08/08/2013   EF 55-60%, mild conc hypertrophy, grade 1 diastolic dysfunction; AV with mild stenosis; LA & RA mildly dilated    FAMILY HISTORY: Family History  Problem Relation Age of Onset  . Heart disease Mother   . Coronary artery disease Father   . Cancer Maternal Grandmother   . Heart Problems Maternal Grandfather   . Diabetes Son        borderline     SOCIAL HISTORY: Social History   Socioeconomic History  . Marital status: Married    Spouse name: Not on file  . Number of children: 3  . Years of education: 168 . Highest education level: Not on file   Occupational History  . Not on file  Tobacco Use  . Smoking status: Former Smoker    Packs/day: 2.00    Years: 20.00    Pack years: 40.00    Types: Cigarettes    Quit date: 01/25/1974    Years since quitting: 46.5  .  Smokeless tobacco: Never Used  Vaping Use  . Vaping Use: Never used  Substance and Sexual Activity  . Alcohol use: No    Alcohol/week: 0.0 standard drinks  . Drug use: No  . Sexual activity: Not on file  Other Topics Concern  . Not on file  Social History Narrative   Lives with wife.    Social Determinants of Health   Financial Resource Strain:   . Difficulty of Paying Living Expenses:   Food Insecurity:   . Worried About Charity fundraiser in the Last Year:   . Arboriculturist in the Last Year:   Transportation Needs:   . Film/video editor (Medical):   Marland Kitchen Lack of Transportation (Non-Medical):   Physical Activity:   . Days of Exercise per Week:   . Minutes of Exercise per Session:   Stress:   . Feeling of Stress :   Social Connections:   . Frequency of Communication with Friends and Family:   . Frequency of Social Gatherings with Friends and Family:   . Attends Religious Services:   . Active Member of Clubs or Organizations:   . Attends Archivist Meetings:   Marland Kitchen Marital Status:   Intimate Partner Violence:   . Fear of Current or Ex-Partner:   . Emotionally Abused:   Marland Kitchen Physically Abused:   . Sexually Abused:    PHYSICAL EXAM  Vitals:   07/23/20 1534  BP: (!) 162/80  Pulse: 77  Weight: 243 lb (110.2 kg)  Height: _0  (1.702 m)   Body mass index is 38.06 kg/m.  Generalized: Well developed, in no acute distress  MMSE - Mini Mental State Exam 07/23/2020 12/22/2017 12/16/2016  Orientation to time _1 Orientation to Place _2 Registration _3 Attention/ Calculation _4 Recall 1 2 0  Language- name 2 objects _5 Language- repeat _6 Language- follow 3 step command _7 Language- read & follow direction _8 Write a sentence _9 Copy design 0 1 1  Total score _10 Neurological examination  Mentation: Alert oriented to time, place, most history is provided by his wife. Follows all commands speech and language fluent, speech is somewhat soft Cranial nerve II-XII: Pupils were equal round reactive to light. Extraocular movements were full, visual field were full on confrontational test. Facial sensation and strength were normal. Head turning and shoulder shrug  were normal and symmetric. Motor: there is overall generalized weakness that is symmetric, wearing left AFO Sensory: Decreased sensation to soft touch to left lower extremity up to mid thigh Coordination: Cerebellar testing reveals good finger-nose-finger bilaterally, difficulty performing heel-to-shin bilaterally Gait and station: Has to push off to stand, reliant on walker or examiner to ambulate, is wide-based, slow, cautious, unsteady Reflexes: Deep tendon reflexes are symmetric but depressed throughout  DIAGNOSTIC DATA (LABS, IMAGING, TESTING) - I reviewed patient records, labs, notes, testing and imaging myself where available.  Lab Results  Component Value Date   WBC 9.5 07/22/2020   HGB 15.8 07/22/2020   HCT 47.6 07/22/2020   MCV 91.7 07/22/2020   PLT 143.0 (L) 07/22/2020      Component Value Date/Time   NA 140 07/22/2020 1218   K 4.2 07/22/2020 1218   CL 104 07/22/2020 1218   CO2 31 07/22/2020 1218   GLUCOSE 123 (H) 07/22/2020 1218  BUN 21 07/22/2020 1218   CREATININE 1.64 (H) 07/22/2020 1218   CREATININE 1.30 (H) 04/30/2020 1044   CALCIUM 9.6 07/22/2020 1218   PROT 6.6 05/20/2020 0953   ALBUMIN 3.9 05/20/2020 0953   AST 14 05/20/2020 0953   ALT 10 05/20/2020 0953   ALKPHOS 72 05/20/2020 0953   BILITOT 0.8 05/20/2020 0953   GFRNONAA 52 (L) 04/30/2020 1044   GFRAA 60 04/30/2020 1044   Lab Results  Component Value Date   CHOL 98 05/23/2019   HDL 38.40 (L) 05/23/2019   LDLCALC 43 05/23/2019   TRIG  83.0 05/23/2019   CHOLHDL 3 05/23/2019   Lab Results  Component Value Date   HGBA1C 6.4 05/20/2020   No results found for: VITAMINB12 No results found for: TSH    ASSESSMENT AND PLAN 79 y.o. year old male  has a past medical history of Aortic stenosis, mild (11/14/2013), Aortic valve sclerosis (03/29/2015), Bilateral leg edema (05/21/2014), CAD (coronary artery disease), Chronic diastolic congestive heart failure ([place]), Chronic kidney disease (CKD), stage III (moderate), Diabetes 1.5, managed as type 1 ([place]) (02/04/2013), Diabetic peripheral neuropathy associated with type 1 diabetes mellitus ([place]) (11/14/2013), Dysphagia, Dyspnea on exertion (03/21/2015), Exogenous obesity, Gout, Heart attack ([place]), History of nuclear stress test (07/2011), Hyperlipidemia, Hypertension, Insulin dependent diabetes mellitus, Left foot drop, Left main coronary artery disease (03/28/2015), Memory loss, Obesity (BMI 30-39.9) (11/14/2013), Obstructive sleep apnea (03/21/2015), OSA on CPAP, Peptic ulcer with hemorrhage (03/28/2015), Peripheral neuropathy, Rhabdomyolysis, S/P CABG x 2 (03/29/2015), Stroke ([place]), Thrombocytopenia ([place]) (03/21/2015), Venous insufficiency, and Weakness generalized (03/21/2015). here with:  1.  Gait abnormality 2.  Cognitive impairment  -Memory issues since around 2013, however within the last several months, have significantly worsened, also report of left side leaning   -Will obtain MRI of the brain to rule out new stroke or structure abnormality contributing to changes  -MRI of the brain in 2013 showed evidence of old left parietal subcortical stroke, mild small vessel disease  -Has history of A. fib, is on Eliquis, also aspirin  -MMSE 21/30 today  -Continue Aricept 10 mg daily, discussed Namenda, may add on after MRI if no significant abnormalities  -Is on B12 supplement, primary follows TSH  -Follow-up in 5 months or sooner if needed  I spent 30 minutes of face-to-face and non-face-to-face  time with patient.  This included previsit chart review, lab review, study review, order entry, electronic health record documentation, patient education.  Butler Denmark, AGNP-C, DNP 07/23/2020, 3:49 PM [facility] [address] [place], [place] [address] [phone]

## 2020-07-23 NOTE — Patient Instructions (Addendum)
Will order MRI of the brain  Continue Aricept at current dosing  See you back in 5 months or sooner if needed

## 2020-07-24 ENCOUNTER — Telehealth: Payer: Self-pay | Admitting: Neurology

## 2020-07-24 NOTE — Telephone Encounter (Signed)
[employer] medicare order sent to [facility]. No auth they will reach out to the patient to schedule.

## 2020-07-29 ENCOUNTER — Other Ambulatory Visit (INDEPENDENT_AMBULATORY_CARE_PROVIDER_SITE_OTHER): Payer: Medicare Other

## 2020-07-29 DIAGNOSIS — Z5181 Encounter for therapeutic drug level monitoring: Secondary | ICD-10-CM

## 2020-07-29 DIAGNOSIS — R918 Other nonspecific abnormal finding of lung field: Secondary | ICD-10-CM

## 2020-07-29 LAB — COMPREHENSIVE METABOLIC PANEL
ALT: 11 U/L (ref 0–53)
AST: 14 U/L (ref 0–37)
Albumin: 3.8 g/dL (ref 3.5–5.2)
Alkaline Phosphatase: 63 U/L (ref 39–117)
BUN: 22 mg/dL (ref 6–23)
CO2: 32 mEq/L (ref 19–32)
Calcium: 9.7 mg/dL (ref 8.4–10.5)
Chloride: 102 mEq/L (ref 96–112)
Creatinine, Ser: 1.63 mg/dL — ABNORMAL HIGH (ref 0.40–1.50)
GFR: 40.96 mL/min — ABNORMAL LOW (ref >60.00)
Glucose, Bld: 152 mg/dL — ABNORMAL HIGH (ref 70–99)
Potassium: 4.1 mEq/L (ref 3.5–5.1)
Sodium: 142 mEq/L (ref 135–145)
Total Bilirubin: 0.7 mg/dL (ref 0.2–1.2)
Total Protein: 6.3 g/dL (ref 6.0–8.3)

## 2020-07-29 LAB — CBC WITH DIFFERENTIAL/PLATELET
Basophils Absolute: 0 10*3/uL (ref 0.0–0.1)
Basophils Relative: 0.5 % (ref 0.0–3.0)
Eosinophils Absolute: 0.1 10*3/uL (ref 0.0–0.7)
Eosinophils Relative: 0.7 % (ref 0.0–5.0)
HCT: 46.4 % (ref 39.0–52.0)
Hemoglobin: 15.3 g/dL (ref 13.0–17.0)
Lymphocytes Relative: 21.5 % (ref 12.0–46.0)
Lymphs Abs: 1.8 10*3/uL (ref 0.7–4.0)
MCHC: 33 g/dL (ref 30.0–36.0)
MCV: 92.3 fl (ref 78.0–100.0)
Monocytes Absolute: 0.8 10*3/uL (ref 0.1–1.0)
Monocytes Relative: 10.1 % (ref 3.0–12.0)
Neutro Abs: 5.6 10*3/uL (ref 1.4–7.7)
Neutrophils Relative %: 67.2 % (ref 43.0–77.0)
Platelets: 127 10*3/uL — ABNORMAL LOW (ref 150.0–400.0)
RBC: 5.02 Mil/uL (ref 4.22–5.81)
RDW: 16.4 % — ABNORMAL HIGH (ref 11.5–15.5)
WBC: 8.3 10*3/uL (ref 4.0–10.5)

## 2020-07-29 LAB — PROTIME-INR
INR: 1.5 ratio — ABNORMAL HIGH (ref 0.8–1.0)
Prothrombin Time: 16.4 s — ABNORMAL HIGH (ref 9.6–13.1)

## 2020-07-29 LAB — APTT: aPTT: 35.7 s — ABNORMAL HIGH (ref 23.4–32.7)

## 2020-07-29 NOTE — Addendum Note (Signed)
Addended by: Amado Coe on: 07/29/2020 09:58 AM   Modules accepted: Orders

## 2020-07-30 ENCOUNTER — Encounter: Payer: Medicare Other | Attending: Endocrinology | Admitting: Nutrition

## 2020-07-30 ENCOUNTER — Other Ambulatory Visit: Payer: Self-pay

## 2020-07-30 DIAGNOSIS — N183 Chronic kidney disease, stage 3 unspecified: Secondary | ICD-10-CM | POA: Diagnosis not present

## 2020-07-30 DIAGNOSIS — E139 Other specified diabetes mellitus without complications: Secondary | ICD-10-CM | POA: Insufficient documentation

## 2020-07-30 DIAGNOSIS — E1129 Type 2 diabetes mellitus with other diabetic kidney complication: Secondary | ICD-10-CM | POA: Diagnosis not present

## 2020-07-30 DIAGNOSIS — I10 Essential (primary) hypertension: Secondary | ICD-10-CM | POA: Diagnosis not present

## 2020-07-30 DIAGNOSIS — E1149 Type 2 diabetes mellitus with other diabetic neurological complication: Secondary | ICD-10-CM | POA: Diagnosis not present

## 2020-07-30 NOTE — Patient Instructions (Signed)
1.Scan sensor at least every 8 hours.  Remember to do this before bed, and first thing in the AM 2.Read over directions for use. 3. Apply a new sensor every 14 days.

## 2020-07-30 NOTE — Progress Notes (Signed)
Patient was identified by name and DOB.  He is here with his wife,to learn about and start the [place] 2 sensor.  We discussed the difference between sensor readings and blood glucose readings,and they reported good understanding of this.   He uses his upper arms for insulin injections, so we placed the sensor onto his upper right abdomen.  His reader was set with date and time/and alerts for lows (below 80),and highs (over 220).  HE was also linked to [facility] and a email was sent to confirm for continuous streaming.   They had no final questions.

## 2020-08-03 ENCOUNTER — Other Ambulatory Visit (HOSPITAL_COMMUNITY)
Admission: RE | Admit: 2020-08-03 | Discharge: 2020-08-03 | Disposition: A | Discharge status: Home / Self Care | Payer: Medicare Other | Source: Ambulatory Visit | Attending: Emergency Medicine | Admitting: Emergency Medicine

## 2020-08-03 DIAGNOSIS — Z20822 Contact with and (suspected) exposure to covid-19: Secondary | ICD-10-CM | POA: Diagnosis not present

## 2020-08-03 DIAGNOSIS — Z01812 Encounter for preprocedural laboratory examination: Secondary | ICD-10-CM | POA: Insufficient documentation

## 2020-08-03 LAB — SARS CORONAVIRUS 2 (TAT 6-24 HRS): SARS Coronavirus 2: NEGATIVE

## 2020-08-05 ENCOUNTER — Encounter (HOSPITAL_COMMUNITY): Payer: Self-pay | Admitting: Emergency Medicine

## 2020-08-05 ENCOUNTER — Telehealth: Payer: Self-pay | Admitting: Nutrition

## 2020-08-05 NOTE — Progress Notes (Signed)
Anesthesia Chart Review: Same-day work-up  Follows with cardiology for history of CAD status post stent to RCA in 2005.  Has had a stent placed in 1997 to diagonal which was noted to be patent.  More recently recently he underwent CABG x2 with LIMA to LAD and SVG to OM1 in 2016.  History of intermittent A. fib anticoagulated on Eliquis.  He also has mild to moderate aortic stenosis with mean gradient 17 mmHg on most recent echo April 2021.  Last seen by Dr. Debara Pickett 07/08/2020 and noted to be in sinus rhythm with Mobitz 1 AV block at that time.  He also advised there would be okay to hold Eliquis 2 to 3 days prior to elective bronchoscopy if necessary.  CVA 2013.  IDDM 2 followed by Dr. Dwyane Dee.  Last A1c 6.4 on 05/20/2020.  OSA on CPAP.  Labs from 07/29/2020 reviewed.  CMP shows stable CKD with creatinine 1.63 (up slightly from June when it was 1.32), CBC shows mildly low platelets at 127, otherwise unremarkable.  EKG 06/08/2020: Sinus rhythm with second-degree AV block (Mobitz 1).  Rate 62.  LAFB.  Nonspecific ST abnormality.  CT chest 06/21/2020: IMPRESSION: 1. Large right lower lobe lung mass now demonstrates central cavitation. 2. Persistent large left upper lobe lung mass. 3. Interval enlargement of mediastinal and hilar lymph nodes. 4. Small right pleural effusion. 5. Stable cardiac enlargement and dense three-vessel coronary artery calcifications. 6. Emphysema and aortic atherosclerosis.  TTE 03/22/20: 1. Left ventricular ejection fraction, by estimation, is 45 to 50%. Left  ventricular ejection fraction by 3D volume is 48 %. The left ventricle has  mildly decreased function. The left ventricle demonstrates global  hypokinesis. There is mild left  ventricular hypertrophy. Left ventricular diastolic parameters are  indeterminate. The average left ventricular global longitudinal strain is  -14.0 %.  2. Right ventricular systolic function is mildly reduced. The right  ventricular size is  mildly enlarged. There is mildly elevated pulmonary  artery systolic pressure. The estimated right ventricular systolic  pressure is 02.5 mmHg.  3. The mitral valve is normal in structure. No evidence of mitral valve  regurgitation.  4. The aortic valve is abnormal. Moderately calcified. Aortic valve  regurgitation is trivial. Mild to moderate aortic valve stenosis. Vmax 2.8  m/s, MG 17 mmHg, AVA 1.1 m/s, DI 0.35  5. The inferior vena cava is normal in size with greater than 50%  respiratory variability, suggesting right atrial pressure of 3 mmHg.   Comparison(s): 04/01/17 EF 55-60%. Mild AS 18mmHg mean, 102mmHg peak.    Wynonia Musty [facility] Short Stay Center/Anesthesiology Phone [phone] 08/05/2020 2:23 PM

## 2020-08-05 NOTE — Telephone Encounter (Signed)
Need to know if he has compared his freestyle libre to fingersticks to see if he is truly having low sugars.  Need to know specifically when his blood sugars are low confirmed by fingerstick

## 2020-08-05 NOTE — Telephone Encounter (Signed)
Message from nurse Michele Rockers RN ) "Hello! I wanted to let you know that short stay had paged the inpatient diabetes team about this pt (coming in tomorrow for a procedure then discharging after procedure). They reported that pt has dementia and his wife seems stressed about the [place] and she noted pt has been having some low readings lately. I saw you seen them on 07/30/20 regarding the [place]. I just wanted you to be aware as you may want to reach out to them to follow up. Thanks, Lelan Pons (Inpatient Diabetes Coordinator) I phoned the wife to let her know that she will have to contact Dr. Ronnie Derby office with low blood sugar readings.  She agreed to do this.

## 2020-08-05 NOTE — Anesthesia Preprocedure Evaluation (Addendum)
Anesthesia Evaluation  Patient identified by MRN, date of birth, ID band Patient awake    Reviewed: Allergy & Precautions, NPO status , Patient's Chart, lab work & pertinent test results  Airway Mallampati: IV   Neck ROM: Limited  Mouth opening: Limited Mouth Opening  Dental no notable dental hx.    Pulmonary shortness of breath, sleep apnea and Continuous Positive Airway Pressure Ventilation , former smoker,     + decreased breath sounds      Cardiovascular hypertension, + CAD, + Past MI and +CHF  + Valvular Problems/Murmurs AS  Rhythm:Regular Rate:Normal + Systolic murmurs    Neuro/Psych PSYCHIATRIC DISORDERS CVA, Residual Symptoms    GI/Hepatic PUD,   Endo/Other  diabetesHypothyroidism Morbid obesity  Renal/GU Renal InsufficiencyRenal disease     Musculoskeletal negative musculoskeletal ROS (+)   Abdominal (+) + obese,   Peds  Hematology thrombocytopenia   Anesthesia Other Findings   Reproductive/Obstetrics                            Anesthesia Physical Anesthesia Plan  ASA: IV  Anesthesia Plan: General   Post-op Pain Management:    Induction:   PONV Risk Score and Plan: 2 and Ondansetron and Propofol infusion  Airway Management Planned: Oral ETT and Video Laryngoscope Planned  Additional Equipment:   Intra-op Plan:   Post-operative Plan: Extubation in OR  Informed Consent:   Plan Discussed with:   Anesthesia Plan Comments: (PAT note by Karoline Caldwell, PA-C: Follows with cardiology for history of CAD status post stent to RCA in 2005.  Has had a stent placed in 1997 to diagonal which was noted to be patent.  More recently recently he underwent CABG x2 with LIMA to LAD and SVG to OM1 in 2016.  History of intermittent A. fib anticoagulated on Eliquis.  He also has mild to moderate aortic stenosis with mean gradient 17 mmHg on most recent echo April 2021.  Last seen by Dr. Debara Pickett  07/08/2020 and noted to be in sinus rhythm with Mobitz 1 AV block at that time.  He also advised there would be okay to hold Eliquis 2 to 3 days prior to elective bronchoscopy if necessary.  CVA 2013.  IDDM 2 followed by Dr. Dwyane Dee.  Last A1c 6.4 on 05/20/2020.  OSA on CPAP.  Labs from 07/29/2020 reviewed.  CMP shows stable CKD with creatinine 1.63 (up slightly from June when it was 1.32), CBC shows mildly low platelets at 127, otherwise unremarkable.  EKG 06/08/2020: Sinus rhythm with second-degree AV block (Mobitz 1).  Rate 62.  LAFB.  Nonspecific ST abnormality.  CT chest 06/21/2020: IMPRESSION: 1. Large right lower lobe lung mass now demonstrates central cavitation. 2. Persistent large left upper lobe lung mass. 3. Interval enlargement of mediastinal and hilar lymph nodes. 4. Small right pleural effusion. 5. Stable cardiac enlargement and dense three-vessel coronary artery calcifications. 6. Emphysema and aortic atherosclerosis.  TTE 03/22/20: 1. Left ventricular ejection fraction, by estimation, is 45 to 50%. Left  ventricular ejection fraction by 3D volume is 48 %. The left ventricle has  mildly decreased function. The left ventricle demonstrates global  hypokinesis. There is mild left  ventricular hypertrophy. Left ventricular diastolic parameters are  indeterminate. The average left ventricular global longitudinal strain is  -14.0 %.  2. Right ventricular systolic function is mildly reduced. The right  ventricular size is mildly enlarged. There is mildly elevated pulmonary  artery systolic pressure. The estimated right  ventricular systolic  pressure is 84.2 mmHg.  3. The mitral valve is normal in structure. No evidence of mitral valve  regurgitation.  4. The aortic valve is abnormal. Moderately calcified. Aortic valve  regurgitation is trivial. Mild to moderate aortic valve stenosis. Vmax 2.8  m/s, MG 17 mmHg, AVA 1.1 m/s, DI 0.35  5. The inferior vena cava is normal in  size with greater than 50%  respiratory variability, suggesting right atrial pressure of 3 mmHg.   Comparison(s): 04/01/17 EF 55-60%. Mild AS 38mmHg mean, 57mmHg peak.  )      Anesthesia Quick Evaluation

## 2020-08-05 NOTE — Progress Notes (Signed)
Spoke to wife for pre-op phone call due to patient's history of dementia. She is cleared to come back to Short Stay with patient due to same. She states patient has not had any chest pain or shob. Cardiologist is Dr. Debara Pickett. Clearance note in chart. Last dose of Eliquis 08/01/20. Reports patient has been in quarantine since COVID test.    She ask if patient could leave new continuous blood glucose monitor on. I advised the would let her know tomorrow but to bring an extra. When ask about patient's blood sugars she reports that patient has had some low blood sugars. She appeared worried about same. I spoke to the DM coordinator on call. They will attempt to follow-up with patient/ wife tomorrow. She will also contact Dr. Ronnie Derby DM educator about the wife's concern.   Below instructions provided regarding DM.    How to Manage Your Diabetes Before and After Surgery   How do I manage my blood sugar before surgery? . Check your blood sugar at least 4 times a day, starting 2 days before surgery, to make sure that the level is not too high or low. o Check your blood sugar the morning of your surgery when you wake up and every 2 hours until you get to the Short Stay unit. . If your blood sugar is less than 70 mg/dL, you will need to treat for low blood sugar: o Do not take insulin. o Treat a low blood sugar (less than 70 mg/dL) with  cup of clear juice (cranberry or apple), 4 glucose tablets, OR glucose gel. Recheck blood sugar in 15 minutes after treatment (to make sure it is greater than 70 mg/dL). If your blood sugar is not greater than 70 mg/dL on recheck, call [phone] o  for further instructions. . Report your blood sugar to the short stay nurse when you get to Short Stay.  . If you are admitted to the hospital after surgery: o Your blood sugar will be checked by the staff and you will probably be given insulin after surgery (instead of oral diabetes medicines) to make sure you have good  blood sugar levels. o The goal for blood sugar control after surgery is 80-180 mg/dL.  WHAT DO I DO ABOUT MY DIABETES MEDICATION?   Marland Kitchen Do not take oral diabetes medicines (pills) the morning of surgery.  . The day of surgery, do not take other diabetes injectables, including Byetta (exenatide), Bydureon (exenatide ER), Victoza (liraglutide), or Trulicity (dulaglutide).  . If your CBG is greater than 220 mg/dL, you may take  of your sliding scale (correction) dose of insulin.

## 2020-08-06 ENCOUNTER — Other Ambulatory Visit: Payer: Self-pay

## 2020-08-06 ENCOUNTER — Ambulatory Visit (HOSPITAL_COMMUNITY): Payer: Medicare Other | Admitting: Physician Assistant

## 2020-08-06 ENCOUNTER — Encounter (HOSPITAL_COMMUNITY): Payer: Self-pay | Admitting: Emergency Medicine

## 2020-08-06 ENCOUNTER — Encounter (HOSPITAL_COMMUNITY)
Admission: RE | Disposition: A | Discharge status: Home / Self Care | Payer: Self-pay | Source: Home / Self Care | Attending: Emergency Medicine

## 2020-08-06 ENCOUNTER — Ambulatory Visit (HOSPITAL_COMMUNITY): Payer: Medicare Other

## 2020-08-06 ENCOUNTER — Ambulatory Visit (HOSPITAL_COMMUNITY)
Admission: RE | Admit: 2020-08-06 | Discharge: 2020-08-06 | Disposition: A | Discharge status: Home / Self Care | Payer: Medicare Other | Attending: Emergency Medicine | Admitting: Emergency Medicine

## 2020-08-06 DIAGNOSIS — Z7982 Long term (current) use of aspirin: Secondary | ICD-10-CM | POA: Insufficient documentation

## 2020-08-06 DIAGNOSIS — E1340 Other specified diabetes mellitus with diabetic neuropathy, unspecified: Secondary | ICD-10-CM | POA: Insufficient documentation

## 2020-08-06 DIAGNOSIS — Z801 Family history of malignant neoplasm of trachea, bronchus and lung: Secondary | ICD-10-CM | POA: Diagnosis not present

## 2020-08-06 DIAGNOSIS — I6990 Unspecified sequelae of unspecified cerebrovascular disease: Secondary | ICD-10-CM | POA: Diagnosis not present

## 2020-08-06 DIAGNOSIS — Z9889 Other specified postprocedural states: Secondary | ICD-10-CM | POA: Diagnosis not present

## 2020-08-06 DIAGNOSIS — Z79899 Other long term (current) drug therapy: Secondary | ICD-10-CM | POA: Insufficient documentation

## 2020-08-06 DIAGNOSIS — E1322 Other specified diabetes mellitus with diabetic chronic kidney disease: Secondary | ICD-10-CM | POA: Diagnosis not present

## 2020-08-06 DIAGNOSIS — Z6838 Body mass index (BMI) 38.0-38.9, adult: Secondary | ICD-10-CM | POA: Diagnosis not present

## 2020-08-06 DIAGNOSIS — N183 Chronic kidney disease, stage 3 unspecified: Secondary | ICD-10-CM | POA: Diagnosis not present

## 2020-08-06 DIAGNOSIS — Z794 Long term (current) use of insulin: Secondary | ICD-10-CM | POA: Diagnosis not present

## 2020-08-06 DIAGNOSIS — Z951 Presence of aortocoronary bypass graft: Secondary | ICD-10-CM | POA: Insufficient documentation

## 2020-08-06 DIAGNOSIS — I5032 Chronic diastolic (congestive) heart failure: Secondary | ICD-10-CM | POA: Diagnosis not present

## 2020-08-06 DIAGNOSIS — Z7901 Long term (current) use of anticoagulants: Secondary | ICD-10-CM | POA: Insufficient documentation

## 2020-08-06 DIAGNOSIS — R590 Localized enlarged lymph nodes: Secondary | ICD-10-CM

## 2020-08-06 DIAGNOSIS — R918 Other nonspecific abnormal finding of lung field: Secondary | ICD-10-CM

## 2020-08-06 DIAGNOSIS — I4891 Unspecified atrial fibrillation: Secondary | ICD-10-CM | POA: Diagnosis not present

## 2020-08-06 DIAGNOSIS — C3412 Malignant neoplasm of upper lobe, left bronchus or lung: Secondary | ICD-10-CM | POA: Diagnosis not present

## 2020-08-06 DIAGNOSIS — G4733 Obstructive sleep apnea (adult) (pediatric): Secondary | ICD-10-CM | POA: Diagnosis not present

## 2020-08-06 DIAGNOSIS — Z79890 Hormone replacement therapy (postmenopausal): Secondary | ICD-10-CM | POA: Insufficient documentation

## 2020-08-06 DIAGNOSIS — E785 Hyperlipidemia, unspecified: Secondary | ICD-10-CM | POA: Insufficient documentation

## 2020-08-06 DIAGNOSIS — I130 Hypertensive heart and chronic kidney disease with heart failure and stage 1 through stage 4 chronic kidney disease, or unspecified chronic kidney disease: Secondary | ICD-10-CM | POA: Insufficient documentation

## 2020-08-06 DIAGNOSIS — Z87891 Personal history of nicotine dependence: Secondary | ICD-10-CM | POA: Diagnosis not present

## 2020-08-06 DIAGNOSIS — C3411 Malignant neoplasm of upper lobe, right bronchus or lung: Secondary | ICD-10-CM | POA: Diagnosis not present

## 2020-08-06 DIAGNOSIS — I517 Cardiomegaly: Secondary | ICD-10-CM | POA: Diagnosis not present

## 2020-08-06 DIAGNOSIS — I252 Old myocardial infarction: Secondary | ICD-10-CM | POA: Insufficient documentation

## 2020-08-06 DIAGNOSIS — I2510 Atherosclerotic heart disease of native coronary artery without angina pectoris: Secondary | ICD-10-CM | POA: Diagnosis not present

## 2020-08-06 HISTORY — PX: BRONCHIAL BRUSHINGS: SHX5108

## 2020-08-06 HISTORY — PX: VIDEO BRONCHOSCOPY WITH ENDOBRONCHIAL NAVIGATION: SHX6175

## 2020-08-06 HISTORY — PX: BRONCHIAL BIOPSY: SHX5109

## 2020-08-06 HISTORY — PX: BRONCHIAL NEEDLE ASPIRATION BIOPSY: SHX5106

## 2020-08-06 HISTORY — DX: Hypothyroidism, unspecified: E03.9

## 2020-08-06 HISTORY — PX: VIDEO BRONCHOSCOPY WITH ENDOBRONCHIAL ULTRASOUND: SHX6177

## 2020-08-06 HISTORY — PX: FINE NEEDLE ASPIRATION: SHX5430

## 2020-08-06 HISTORY — DX: Pneumonia, unspecified organism: J18.9

## 2020-08-06 LAB — GLUCOSE, CAPILLARY: Glucose-Capillary: 151 mg/dL — ABNORMAL HIGH (ref 70–99)

## 2020-08-06 LAB — PROTIME-INR
INR: 1 (ref 0.8–1.2)
Prothrombin Time: 12.5 seconds (ref 11.4–15.2)

## 2020-08-06 SURGERY — VIDEO BRONCHOSCOPY WITH ENDOBRONCHIAL NAVIGATION
Anesthesia: General

## 2020-08-06 MED ORDER — PROPOFOL 10 MG/ML IV BOLUS
INTRAVENOUS | Status: DC | PRN
  Administered 2020-08-06: 150 mg via INTRAVENOUS

## 2020-08-06 MED ORDER — SUCCINYLCHOLINE CHLORIDE 200 MG/10ML IV SOSY
PREFILLED_SYRINGE | INTRAVENOUS | Status: DC | PRN
  Administered 2020-08-06: 180 mg via INTRAVENOUS

## 2020-08-06 MED ORDER — LACTATED RINGERS IV SOLN: INTRAVENOUS | Status: DC

## 2020-08-06 MED ORDER — EPHEDRINE SULFATE-NACL 50-0.9 MG/10ML-% IV SOSY
PREFILLED_SYRINGE | INTRAVENOUS | Status: DC | PRN
  Administered 2020-08-06: 5 mg via INTRAVENOUS
  Administered 2020-08-06: 10 mg via INTRAVENOUS

## 2020-08-06 MED ORDER — LIDOCAINE 2% (20 MG/ML) 5 ML SYRINGE
INTRAMUSCULAR | Status: DC | PRN
  Administered 2020-08-06: 100 mg via INTRAVENOUS

## 2020-08-06 MED ORDER — CHLORHEXIDINE GLUCONATE 0.12 % MT SOLN
OROMUCOSAL | Status: AC
  Administered 2020-08-06: 15 mL
  Filled 2020-08-06: qty 15

## 2020-08-06 MED ORDER — ONDANSETRON HCL 4 MG/2ML IJ SOLN
INTRAMUSCULAR | Status: DC | PRN
  Administered 2020-08-06: 4 mg via INTRAVENOUS

## 2020-08-06 MED ORDER — FENTANYL CITRATE (PF) 100 MCG/2ML IJ SOLN: 25.0000 ug | INTRAMUSCULAR | Status: DC | PRN

## 2020-08-06 MED ORDER — SUGAMMADEX SODIUM 200 MG/2ML IV SOLN
INTRAVENOUS | Status: DC | PRN
  Administered 2020-08-06 (×2): 100 mg via INTRAVENOUS

## 2020-08-06 MED ORDER — PHENYLEPHRINE 40 MCG/ML (10ML) SYRINGE FOR IV PUSH (FOR BLOOD PRESSURE SUPPORT)
PREFILLED_SYRINGE | INTRAVENOUS | Status: DC | PRN
  Administered 2020-08-06: 80 ug via INTRAVENOUS
  Administered 2020-08-06: 40 ug via INTRAVENOUS
  Administered 2020-08-06: 120 ug via INTRAVENOUS
  Administered 2020-08-06: 80 ug via INTRAVENOUS
  Administered 2020-08-06: 40 ug via INTRAVENOUS
  Administered 2020-08-06 (×4): 80 ug via INTRAVENOUS

## 2020-08-06 MED ORDER — APIXABAN 5 MG PO TABS
5.0000 mg | ORAL_TABLET | Freq: Two times a day (BID) | ORAL | 11 refills | Status: DC
Start: 2020-08-06 — End: 2020-10-08

## 2020-08-06 MED ORDER — ROCURONIUM BROMIDE 10 MG/ML (PF) SYRINGE
PREFILLED_SYRINGE | INTRAVENOUS | Status: DC | PRN
  Administered 2020-08-06: 10 mg via INTRAVENOUS
  Administered 2020-08-06: 50 mg via INTRAVENOUS

## 2020-08-06 NOTE — Anesthesia Procedure Notes (Signed)
Procedure Name: Intubation Date/Time: 08/06/2020 7:46 AM Performed by: Trinna Post., CRNA Pre-anesthesia Checklist: Patient identified, Emergency Drugs available, Suction available, Patient being monitored and Timeout performed Patient Re-evaluated:Patient Re-evaluated prior to induction Oxygen Delivery Method: Circle system utilized Preoxygenation: Pre-oxygenation with 100% oxygen Induction Type: IV induction Ventilation: Mask ventilation without difficulty Laryngoscope Size: Glidescope and 4 Grade View: Grade I Tube type: Oral Tube size: 8.5 mm Number of attempts: 1 Airway Equipment and Method: Rigid stylet and Video-laryngoscopy Placement Confirmation: ETT inserted through vocal cords under direct vision,  positive ETCO2 and breath sounds checked- equal and bilateral Secured at: 23 cm Tube secured with: Tape Dental Injury: Teeth and Oropharynx as per pre-operative assessment

## 2020-08-06 NOTE — Anesthesia Postprocedure Evaluation (Signed)
Anesthesia Post Note  Patient: Johnathin S Martinique  Procedure(s) Performed: VIDEO BRONCHOSCOPY WITH ENDOBRONCHIAL NAVIGATION (N/A ) VIDEO BRONCHOSCOPY WITH ENDOBRONCHIAL ULTRASOUND (N/A ) BRONCHIAL BRUSHINGS BRONCHIAL BIOPSIES BRONCHIAL NEEDLE ASPIRATION BIOPSIES FINE NEEDLE ASPIRATION (FNA) LINEAR     Patient location during evaluation: PACU Anesthesia Type: General Level of consciousness: awake and alert Pain management: pain level controlled Vital Signs Assessment: post-procedure vital signs reviewed and stable Respiratory status: spontaneous breathing, nonlabored ventilation and respiratory function stable Cardiovascular status: blood pressure returned to baseline and stable Postop Assessment: no apparent nausea or vomiting Anesthetic complications: no   No complications documented.  Last Vitals:  Vitals:   08/06/20 0956 08/06/20 1009  BP: 136/66 133/72  Pulse: 66 66  Resp: (!) 24 14  Temp:  (!) 36.2 C  SpO2: 100% 95%    Last Pain:  Vitals:   08/06/20 1009  TempSrc:   PainSc: 0-No pain                 Merlinda Frederick

## 2020-08-06 NOTE — Transfer of Care (Signed)
Immediate Anesthesia Transfer of Care Note  Patient: Randall Wilkins  Procedure(s) Performed: VIDEO BRONCHOSCOPY WITH ENDOBRONCHIAL NAVIGATION (N/A ) VIDEO BRONCHOSCOPY WITH ENDOBRONCHIAL ULTRASOUND (N/A ) BRONCHIAL BRUSHINGS BRONCHIAL BIOPSIES BRONCHIAL NEEDLE ASPIRATION BIOPSIES FINE NEEDLE ASPIRATION (FNA) LINEAR  Patient Location: PACU  Anesthesia Type:General  Level of Consciousness: awake and drowsy  Airway & Oxygen Therapy: Patient Spontanous Breathing and Patient connected to face mask oxygen  Post-op Assessment: Report given to RN and Post -op Vital signs reviewed and stable  Post vital signs: Reviewed and stable  Last Vitals:  Vitals Value Taken Time  BP 150/89 08/06/20 0939  Temp 36.2 C 08/06/20 0939  Pulse 62 08/06/20 0944  Resp 22 08/06/20 0944  SpO2 100 % 08/06/20 0944  Vitals shown include unvalidated device data.  Last Pain:  Vitals:   08/06/20 0629  TempSrc:   PainSc: 0-No pain         Complications: No complications documented.

## 2020-08-06 NOTE — Interval H&P Note (Signed)
History and Physical Interval Note:  08/06/2020 7:19 AM  Randall Wilkins  has presented today for surgery, with the diagnosis of LUNG MASS with MEDIASTINAL LYMPHADENOPATHY.  The various methods of treatment have been discussed with the patient and family. After consideration of risks, benefits and other options for treatment, the patient has consented to  Procedure(s): VIDEO BRONCHOSCOPY WITH ENDOBRONCHIAL NAVIGATION (N/A) VIDEO BRONCHOSCOPY WITH ENDOBRONCHIAL ULTRASOUND (N/A) as a surgical intervention.  The patient's history has been reviewed, patient examined, no change in status, stable for surgery.  I have reviewed the patient's chart and labs.  Questions were answered to the patient's satisfaction.    He stopped his anticoagulation last Wednesday.    Collene Gobble

## 2020-08-06 NOTE — Discharge Instructions (Signed)
Flexible Bronchoscopy, Care After This sheet gives you information about how to care for yourself after your test. Your doctor may also give you more specific instructions. If you have problems or questions, contact your doctor. Follow these instructions at home: Eating and drinking  Do not eat or drink anything (not even water) for 2 hours after your test, or until your numbing medicine (local anesthetic) wears off.  When your numbness is gone and your cough and gag reflexes have come back, you may: ? Eat only soft foods. ? Slowly drink liquids.  The day after the test, go back to your normal diet. Driving  Do not drive for 24 hours if you were given a medicine to help you relax (sedative).  Do not drive or use heavy machinery while taking prescription pain medicine. General instructions   Take over-the-counter and prescription medicines only as told by your doctor.  Return to your normal activities as told. Ask what activities are safe for you.  Do not use any products that have nicotine or tobacco in them. This includes cigarettes and e-cigarettes. If you need help quitting, ask your doctor.  Keep all follow-up visits as told by your doctor. This is important. It is very important if you had a tissue sample (biopsy) taken. Get help right away if:  You have shortness of breath that gets worse.  You get light-headed.  You feel like you are going to pass out (faint).  You have chest pain.  You cough up: ? More than a little blood. ? More blood than before. Summary  Do not eat or drink anything (not even water) for 2 hours after your test, or until your numbing medicine wears off.  Do not use cigarettes. Do not use e-cigarettes.  Get help right away if you have chest pain.   Please call our office for any questions or concerns.  [phone].  You may restart your Eliquis on Thursday, 08/08/2020.  This information is not intended to replace advice given to you by  your health care provider. Make sure you discuss any questions you have with your health care provider. Document Revised: 11/12/2017 Document Reviewed: 12/18/2016 Elsevier Patient Education  2020 [employer].

## 2020-08-06 NOTE — Op Note (Signed)
Video Bronchoscopy with Electromagnetic Navigation and Endobronchial Ultrasound Procedure Note  Date of Operation: 08/06/2020  Pre-op Diagnosis: Bilateral upper lobe lung masses, mediastinal lymphadenopathy  Post-op Diagnosis: Same  Surgeon: Baltazar Apo  Assistants: None  Anesthesia: General endotracheal anesthesia  Operation: Flexible video fiberoptic bronchoscopy with electromagnetic navigation and biopsies.  Estimated Blood Loss: Minimal  Complications: None apparent  Indications and History: Randall Wilkins is a 79 y.o. male with history of tobacco use.  He was treated for bilateral upper lobe pneumonias when opacities were noted on chest x-ray and subsequent CT chest.  The bilateral upper lobe rounded opacities persisted despite appropriate antibiotic therapy concerning for possible malignancy.  Mediastinal lymphadenopathy was also noted.  Recommendation was made to achieve a tissue diagnosis via navigational bronchoscopy with endobronchial ultrasound.  The risks, benefits, complications, treatment options and expected outcomes were discussed with the patient.  The possibilities of pneumothorax, pneumonia, reaction to medication, pulmonary aspiration, perforation of a viscus, bleeding, failure to diagnose a condition and creating a complication requiring transfusion or operation were discussed with the patient who freely signed the consent.    Description of Procedure: The patient was seen in the Preoperative Area, was examined and was deemed appropriate to proceed.  The patient was taken to [facility] endoscopy room 2, identified as Randall Wilkins and the procedure verified as Flexible Video Fiberoptic Bronchoscopy.  A Time Out was held and the above information confirmed.   Prior to the date of the procedure a high-resolution CT scan of the chest was performed. Utilizing [place] a virtual tracheobronchial tree was generated to allow the creation of distinct navigation  pathways to the patient's parenchymal abnormalities. After being taken to the operating room general anesthesia was initiated and the patient  was orally intubated. The video fiberoptic bronchoscope was introduced via the endotracheal tube and a general inspection was performed which showed normal airways throughout.  There was some scant yellow/white secretions that were easily suctioned.  No endobronchial lesions. The extendable working channel and locator guide were introduced into the bronchoscope. The distinct navigation pathways prepared prior to this procedure were then utilized to navigate to within 0.3 cm of patient's lesion(s) identified on CT scan. The extendable working channel was secured into place at each location and the locator guide was withdrawn. Under fluoroscopic guidance transbronchial needle brushings, transbronchial Wang needle biopsies, and transbronchial forceps biopsies were performed to be sent for cytology and pathology.  Target 1 right upper lobe opacity, target 2 left upper lobe opacity.   The standard scope was then withdrawn and the endobronchial ultrasound was used to identify and characterize the peritracheal, hilar and bronchial lymph nodes. Inspection showed slight enlargement at station 4R, station 7, station 10 R. Using real-time ultrasound guidance Wang needle biopsies were take from [place], 7, 10 R nodes and were sent for cytology.   At the end of the procedure a general airway inspection was performed and there was no evidence of active bleeding. The bronchoscope was removed.  The patient tolerated the procedure well. There was no significant blood loss and there were no obvious complications. A post-procedural chest x-ray is pending.   Samples: 1. Transbronchial needle brushings from right upper lobe opacity, target 1 2. Transbronchial Wang needle biopsies from right upper lobe opacity, target 1 3. Transbronchial forceps biopsies from upper lobe opacity,  target 1 4. Transbronchial needle brushings from left upper lobe opacity, target 2 5. Transbronchial Wang needle biopsies from left upper lobe opacity, target  2 6. Transbronchial forceps biopsies from left upper lobe opacity, target 2 7. Wang needle biopsies from 4R node 8. Wang needle biopsies from 7 node 9. Wang needle biopsies from 10 R node   Plans:  The patient will be discharged from the PACU to home when recovered from anesthesia and after chest x-ray is reviewed. We will review the cytology, pathology results with the patient when they become available. Outpatient followup will be with Dr Lamonte Sakai.    Baltazar Apo, MD, PhD 08/06/2020, 9:39 AM  Pulmonary and Critical Care [phone] or if no answer [phone]

## 2020-08-07 ENCOUNTER — Encounter (HOSPITAL_COMMUNITY): Payer: Self-pay | Admitting: Emergency Medicine

## 2020-08-07 ENCOUNTER — Other Ambulatory Visit: Payer: Self-pay

## 2020-08-07 NOTE — Telephone Encounter (Signed)
Called pt's wife and gave her MD message. She verbalized understanding and med list was updated.

## 2020-08-07 NOTE — Telephone Encounter (Signed)
Spoke with pt's wife and she states that she confirmed all low readings with a  finger stick. Some readings were off by about 30 points, while some were only off by 5 points. All low readings were either before breakfast or between lunch and supper.  Readings today were 100 at 0930 (other meter said 122).

## 2020-08-07 NOTE — Telephone Encounter (Signed)
If he is taking 50 units of [place] he can reduce it to 46

## 2020-08-08 ENCOUNTER — Ambulatory Visit
Admission: RE | Admit: 2020-08-08 | Discharge: 2020-08-08 | Disposition: A | Discharge status: Home / Self Care | Payer: Medicare Other | Source: Ambulatory Visit | Attending: Neurology | Admitting: Neurology

## 2020-08-08 DIAGNOSIS — R413 Other amnesia: Secondary | ICD-10-CM

## 2020-08-09 ENCOUNTER — Telehealth: Payer: Self-pay | Admitting: Emergency Medicine

## 2020-08-09 ENCOUNTER — Other Ambulatory Visit: Payer: Self-pay | Admitting: Endocrinology

## 2020-08-09 DIAGNOSIS — C3490 Malignant neoplasm of unspecified part of unspecified bronchus or lung: Secondary | ICD-10-CM

## 2020-08-09 NOTE — Telephone Encounter (Signed)
Called to speak to Wife and also to son Randall Wilkins. Left keith a message, will try back.

## 2020-08-11 NOTE — Telephone Encounter (Signed)
Opened in error

## 2020-08-12 ENCOUNTER — Other Ambulatory Visit: Payer: Self-pay | Admitting: Endocrinology

## 2020-08-12 LAB — CYTOLOGY - NON PAP

## 2020-08-12 LAB — SURGICAL PATHOLOGY

## 2020-08-12 NOTE — Telephone Encounter (Signed)
Explained bronchoscopy findings to the patient's wife by phone.  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 He had a MRI brain on 08/09/2020 that did not show any metastatic disease. They would like to be referred to the [facility] and I will arrange.  We will also order PET scan

## 2020-08-12 NOTE — Telephone Encounter (Signed)
Routing to Dr. Lamonte Sakai so he can contact pt.

## 2020-08-13 ENCOUNTER — Encounter: Payer: Self-pay | Admitting: *Deleted

## 2020-08-13 ENCOUNTER — Telehealth: Payer: Self-pay | Admitting: *Deleted

## 2020-08-13 DIAGNOSIS — R590 Localized enlarged lymph nodes: Secondary | ICD-10-CM

## 2020-08-13 NOTE — Telephone Encounter (Signed)
Okay, we can add on when they are ready.

## 2020-08-13 NOTE — Telephone Encounter (Signed)
LMVM to return call for MRI results.

## 2020-08-13 NOTE — Telephone Encounter (Signed)
No answer. Will try later

## 2020-08-13 NOTE — Telephone Encounter (Signed)
-----   Message from Suzzanne Cloud, NP sent at 08/12/2020  2:55 PM EDT ----- Please call for MRI, MRI of the brain shows moderate generalized cortical atrophy, significantly progressed compared to prior in 2013, correlates with memory decline.  Left parietal infarction was seen, is not new, was seen on 2013 MRI.  Remote hemorrhage of posterior thalamus was unchanged.  No acute findings. No new stroke was noted. He remains on Aricept, we can add on Namenda for memory if he would like. He is already taking Eliquis and aspirin.   IMPRESSION: This MRI of the brain without contrast shows the following: 1.   Moderate generalized cortical atrophy that has significantly progressed compared to the 2013 MRI. 2.   Remote left parietal infarction with associated encephalomalacia and gliosis.  This was seen on the 2013 MRI. 3.   T2/FLAIR hyperintense foci in the hemispheres consistent with chronic microvascular ischemic change 4.   Changes on susceptibility weighted images consistent with calcification possibly related to a remote hemorrhage in the posterior thalamus that was noted on the 2007 CT scan.  The appearance of these images is unchanged compared to the 2013 MRI.  There is a chronic microhemorrhage in the anterior left thalamus, also noted on the previous MRI. 5.   There were no acute findings.

## 2020-08-13 NOTE — Telephone Encounter (Signed)
I spoke to pts wife.  I gave her the MRI results.   Relayed that MRI showed moderate atrophy compared to prior 2013.  No acute changes to new strokes.  She was thankful for that.  She was interested in the namenda titration, but she stated he has new lung cancer diagnosis ( from Dr. Ander Slade).  Will be seeing MD this week or next.  Wanted to let us know relating to this delaying adding another medications.

## 2020-08-13 NOTE — Progress Notes (Signed)
I received referral on Randall Wilkins today.  I updated new patient coordinator to call and schedule him to be seen next week.  Labs entered

## 2020-08-14 ENCOUNTER — Telehealth: Payer: Self-pay | Admitting: Internal Medicine

## 2020-08-14 NOTE — Telephone Encounter (Signed)
Received a new pt referral from Dr. Lamonte Sakai for  Adenocarcinoma of lung. Randall Wilkins has been scheduled to see Dr. Julien Nordmann on 9/7 at 4pm w/labs at 330pm. Appt date and time has been given to the pt's wife. Aware to arrive 15 minutes early.

## 2020-08-14 NOTE — Telephone Encounter (Signed)
I called pts wife.  I relayed that SS/NP was good to do what the wife and pt wanted to do re: adding new medication.  She stated he has appt with cancer MD 08-20-20 relating to plan and will try to ask about adding this namenda to his regimen and then get back with [place] about it.

## 2020-08-20 ENCOUNTER — Inpatient Hospital Stay: Payer: Medicare Other | Attending: Oncology

## 2020-08-20 ENCOUNTER — Inpatient Hospital Stay: Payer: Medicare Other | Admitting: Internal Medicine

## 2020-08-20 ENCOUNTER — Other Ambulatory Visit: Payer: Self-pay | Admitting: Medical Oncology

## 2020-08-20 ENCOUNTER — Inpatient Hospital Stay: Payer: Medicare Other

## 2020-08-20 ENCOUNTER — Encounter: Payer: Self-pay | Admitting: Internal Medicine

## 2020-08-20 ENCOUNTER — Other Ambulatory Visit: Payer: Self-pay

## 2020-08-20 VITALS — BP 130/62 | HR 62 | Temp 96.6°F | Resp 18 | Ht 67.0 in | Wt 238.7 lb

## 2020-08-20 DIAGNOSIS — C3412 Malignant neoplasm of upper lobe, left bronchus or lung: Secondary | ICD-10-CM | POA: Insufficient documentation

## 2020-08-20 DIAGNOSIS — E1122 Type 2 diabetes mellitus with diabetic chronic kidney disease: Secondary | ICD-10-CM

## 2020-08-20 DIAGNOSIS — Z5112 Encounter for antineoplastic immunotherapy: Secondary | ICD-10-CM | POA: Insufficient documentation

## 2020-08-20 DIAGNOSIS — Z7189 Other specified counseling: Secondary | ICD-10-CM

## 2020-08-20 DIAGNOSIS — Z87891 Personal history of nicotine dependence: Secondary | ICD-10-CM

## 2020-08-20 DIAGNOSIS — N189 Chronic kidney disease, unspecified: Secondary | ICD-10-CM | POA: Diagnosis not present

## 2020-08-20 DIAGNOSIS — R590 Localized enlarged lymph nodes: Secondary | ICD-10-CM

## 2020-08-20 DIAGNOSIS — E039 Hypothyroidism, unspecified: Secondary | ICD-10-CM

## 2020-08-20 DIAGNOSIS — C3490 Malignant neoplasm of unspecified part of unspecified bronchus or lung: Secondary | ICD-10-CM

## 2020-08-20 DIAGNOSIS — Z5111 Encounter for antineoplastic chemotherapy: Secondary | ICD-10-CM | POA: Insufficient documentation

## 2020-08-20 DIAGNOSIS — C3411 Malignant neoplasm of upper lobe, right bronchus or lung: Secondary | ICD-10-CM | POA: Diagnosis not present

## 2020-08-20 DIAGNOSIS — E669 Obesity, unspecified: Secondary | ICD-10-CM

## 2020-08-20 DIAGNOSIS — I2510 Atherosclerotic heart disease of native coronary artery without angina pectoris: Secondary | ICD-10-CM | POA: Diagnosis not present

## 2020-08-20 DIAGNOSIS — G629 Polyneuropathy, unspecified: Secondary | ICD-10-CM

## 2020-08-20 DIAGNOSIS — Z79899 Other long term (current) drug therapy: Secondary | ICD-10-CM | POA: Insufficient documentation

## 2020-08-20 DIAGNOSIS — I252 Old myocardial infarction: Secondary | ICD-10-CM

## 2020-08-20 DIAGNOSIS — I129 Hypertensive chronic kidney disease with stage 1 through stage 4 chronic kidney disease, or unspecified chronic kidney disease: Secondary | ICD-10-CM

## 2020-08-20 LAB — CBC WITH DIFFERENTIAL (CANCER CENTER ONLY)
Abs Immature Granulocytes: 0.05 10*3/uL (ref 0.00–0.07)
Basophils Absolute: 0.1 10*3/uL (ref 0.0–0.1)
Basophils Relative: 1 %
Eosinophils Absolute: 0.1 10*3/uL (ref 0.0–0.5)
Eosinophils Relative: 1 %
HCT: 46.7 % (ref 39.0–52.0)
Hemoglobin: 15.1 g/dL (ref 13.0–17.0)
Immature Granulocytes: 1 %
Lymphocytes Relative: 21 %
Lymphs Abs: 2.1 10*3/uL (ref 0.7–4.0)
MCH: 29.6 pg (ref 26.0–34.0)
MCHC: 32.3 g/dL (ref 30.0–36.0)
MCV: 91.6 fL (ref 80.0–100.0)
Monocytes Absolute: 1 10*3/uL (ref 0.1–1.0)
Monocytes Relative: 9 %
Neutro Abs: 6.9 10*3/uL (ref 1.7–7.7)
Neutrophils Relative %: 67 %
Platelet Count: 198 10*3/uL (ref 150–400)
RBC: 5.1 MIL/uL (ref 4.22–5.81)
RDW: 14.8 % (ref 11.5–15.5)
WBC Count: 10.2 10*3/uL (ref 4.0–10.5)
nRBC: 0 % (ref 0.0–0.2)

## 2020-08-20 LAB — CMP (CANCER CENTER ONLY)
ALT: 10 U/L (ref 0–44)
AST: 10 U/L — ABNORMAL LOW (ref 15–41)
Albumin: 3.3 g/dL — ABNORMAL LOW (ref 3.5–5.0)
Alkaline Phosphatase: 89 U/L (ref 38–126)
Anion gap: 8 (ref 5–15)
BUN: 19 mg/dL (ref 8–23)
CO2: 30 mmol/L (ref 22–32)
Calcium: 9.5 mg/dL (ref 8.9–10.3)
Chloride: 102 mmol/L (ref 98–111)
Creatinine: 1.42 mg/dL — ABNORMAL HIGH (ref 0.61–1.24)
GFR, Est AFR Am: 54 mL/min — ABNORMAL LOW (ref >60)
GFR, Estimated: 47 mL/min — ABNORMAL LOW (ref >60)
Glucose, Bld: 140 mg/dL — ABNORMAL HIGH (ref 70–99)
Potassium: 4.4 mmol/L (ref 3.5–5.1)
Sodium: 140 mmol/L (ref 135–145)
Total Bilirubin: 0.7 mg/dL (ref 0.3–1.2)
Total Protein: 6.9 g/dL (ref 6.5–8.1)

## 2020-08-20 NOTE — Progress Notes (Signed)
[place] [phone]   [phone]  CONSULT NOTE  REFERRING PHYSICIAN: Dr. Baltazar Apo  REASON FOR CONSULTATION:  79 years old white male recently diagnosed with lung cancer.  HPI Randall Wilkins is a 79 y.o. male with past medical history significant for multiple medical problems including coronary artery disease, congestive heart failure, chronic kidney disease, diabetes mellitus, hypertension, dyslipidemia, gout, hypothyroidism, obstructive sleep apnea, peripheral neuropathy, stroke as well as multiple back surgeries and rhabdomyolysis and peripheral vascular disease.  The patient also has a history of smoking but quit in 1975.  In April 2021 has been complaining of feeling bad and rattling in his chest.  He was seen by his primary care physician and chest x-ray on 03/26/2020 showed persistent and worsening bilateral multifocal consolidation with possible small amount of right pleural effusion.  This was followed by CT scan of the chest with contrast on 03/27/2020 and it showed dense airspace consolidations within the right upper lobe, right lower lobe and left upper lobe compatible with multifocal pneumonia.  There was no findings to suggest underlying malignancy and there was traced right parapneumonic effusion.  The patient was referred to Dr. Lamonte Sakai and repeat CT super D of the chest without contrast on 06/21/2020 showed large right lower lobe lung mass medially measured 5.7 x 5.7 cm with central cavitation.  There appears to be a fairly abrupt cut of of the right lower lobe bronchus in this area.  There was also a persistent large left upper lobe lung mass measured maximum of 8.0 cm.  The scan also showed interval enlargement of scattered mediastinal and hilar lymph nodes.  The pretracheal node measured 1.0 cm.  On 08/06/2020 the patient underwent video bronchoscopy with electromagnetic navigation and endobronchial ultrasound procedure under the care of Dr.  Lamonte Sakai. The final pathology ([id number]) from the right upper lobe as well as the left upper lobe biopsy were consistent with mucinous adenocarcinoma. On 08/08/2020 the patient had MRI of the brain without contrast that showed no evidence of metastatic disease to the brain.  The patient is also scheduled for a PET scan on August 22, 2020. He was referred by Dr. Lamonte Sakai to me today for evaluation and recommendation regarding treatment of his condition. When seen today he continues to have shortness of breath at baseline increased with exertion as well as dry cough but no significant chest pain or hemoptysis.  He lost around 50 pounds in the last 6 months.  He denied having any nausea, vomiting, diarrhea or constipation.  He denied having any headache or visual changes.  He has no fever or chills. Family history significant for mother died from old age at age 46 and father died from heart disease. The patient is married and has 1 son and 2 stepchildren.  He is to work as a Physiological scientist.  He was accompanied today by his wife Randall Wilkins.  The patient has a history of smoking 2 pack/day for around 20 years but quit in 1975.  He has no history of alcohol or drug abuse. HPI  Past Medical History:  Diagnosis Date  . Aortic stenosis, mild 11/14/2013  . Aortic valve sclerosis 03/29/2015  . Bilateral leg edema 05/21/2014  . CAD (coronary artery disease)   . Chronic diastolic congestive heart failure ([place])   . Chronic kidney disease (CKD), stage III (moderate)   . Diabetes 1.5, managed as type 1 ([place]) 02/04/2013  . Diabetic peripheral neuropathy associated with type 1 diabetes mellitus (  [place]) 11/14/2013  . Dysphagia   . Dyspnea on exertion 03/21/2015  . Exogenous obesity   . Gout   . Heart attack ([place])   . History of nuclear stress test 07/2011   dipyridamole; fixed inferolateral defect, worse at stress than rest; no reversible ischemia; low risk scan   . Hyperlipidemia   . Hypertension   . Hypothyroidism     . Insulin dependent diabetes mellitus   . Left foot drop   . Left main coronary artery disease 03/28/2015  . Memory loss   . Obesity (BMI 30-39.9) 11/14/2013  . Obstructive sleep apnea 03/21/2015  . OSA on CPAP    uses a cpap  . Peptic ulcer with hemorrhage 03/28/2015  . Peripheral neuropathy   . Pneumonia   . Rhabdomyolysis   . 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 Stroke ([place])    L patietal with small scattered lacunar infarcts  . Thrombocytopenia ([place]) 03/21/2015  . Venous insufficiency   . Weakness generalized 03/21/2015    Past Surgical History:  Procedure Laterality Date  . BACK SURGERY  2002   lumbosacral. 11 back surgeries total  . BRONCHIAL BIOPSY  08/06/2020   Procedure: BRONCHIAL BIOPSIES;  Surgeon: Collene Gobble, MD;  Location[facility] ENDOSCOPY;  Service: Pulmonary;;  . BRONCHIAL BRUSHINGS  08/06/2020   Procedure: BRONCHIAL BRUSHINGS;  Surgeon: Collene Gobble, MD;  Location: [facility] ENDOSCOPY;  Service: Pulmonary;;  . BRONCHIAL NEEDLE ASPIRATION BIOPSY  08/06/2020   Procedure: BRONCHIAL NEEDLE ASPIRATION BIOPSIES;  Surgeon: Collene Gobble, MD;  Location: [facility] ENDOSCOPY;  Service: Pulmonary;;  . Carotid Doppler  03/2013   bilat bulb/prox ICAs - mild amount of fibrous plaque with no evidence of diameter reduction  . CARPAL TUNNEL RELEASE Bilateral 08/09/2014   Procedure: BILATERAL CARPAL TUNNEL RELEASE;  Surgeon: Daryll Brod, MD;  Location: [place];  Service: Orthopedics;  Laterality: Bilateral;  ANESTHESIA:  IV REGIONAL BIL FAB  . CHOLECYSTECTOMY    . COLONOSCOPY    . CORONARY ANGIOPLASTY  10/13/1996  . CORONARY ANGIOPLASTY  09/21/1989   emergency PTCA  . CORONARY ANGIOPLASTY  10/13/1996   Multi-Link diagonal & OD stenting (Dr. Marella Chimes)  . CORONARY ANGIOPLASTY  12/03/1997   disease of mid DX-1 ~50% & in mid PLA & PDA (distal lesions) (Dr. Marella Chimes)   . CORONARY ANGIOPLASTY  10/14/1999   progression of disease distal  PLA & PDA; progression of disease prox RCA - moderate (Dr. Marella Chimes)   . CORONARY ANGIOPLASTY WITH STENT PLACEMENT  04/04/2004   4.0x55m non-DES (thrombectomy via AngioJet) to RCA for high grade stenosis (Dr. RMarella Chimes  . CORONARY ARTERY BYPASS GRAFT N/A 03/29/2015   Procedure: CORONARY ARTERY BYPASS GRAFTING TIMES TWO USING LEFT INTERNAL MAMMARY ARTERY AND RIGHT LEG GREATER SAPHENOUS VEIN HARVESTED ENDOSCOPICALLY.;  Surgeon: CRexene Alberts MD;  Location: [facility]  Service: Open Heart Surgery;  Laterality: N/A;  . ESOPHAGOGASTRODUODENOSCOPY N/A 03/27/2015   Procedure: ESOPHAGOGASTRODUODENOSCOPY (EGD);  Surgeon: MClarene Essex MD;  [facility];  Service: Endoscopy;  Laterality: N/A;  possible dilation  . FINE NEEDLE ASPIRATION  08/06/2020   Procedure: FINE NEEDLE ASPIRATION (FNA) LINEAR;  Surgeon: BCollene Gobble MD;  [facility];  Service: Pulmonary;;  . LEFT HEART CATHETERIZATION WITH CORONARY ANGIOGRAM N/A 03/28/2015   Procedure: LEFT HEART CATHETERIZATION WITH CORONARY ANGIOGRAM;  Surgeon: Peter M JMartinique MD;  [facility];  Service: Cardiovascular;  Laterality: N/A;  . SINUS ENDO W/FUSION    . TONSILLECTOMY    . TRANSTHORACIC ECHOCARDIOGRAM  08/08/2013   EF 55-60%, mild conc hypertrophy, grade 1 diastolic dysfunction; AV with mild stenosis; LA & RA mildly dilated  . VIDEO BRONCHOSCOPY WITH ENDOBRONCHIAL NAVIGATION N/A 08/06/2020   Procedure: VIDEO BRONCHOSCOPY WITH ENDOBRONCHIAL NAVIGATION;  Surgeon: Collene Gobble, MD;  Location: [place] ENDOSCOPY;  Service: Pulmonary;  Laterality: N/A;  . VIDEO BRONCHOSCOPY WITH ENDOBRONCHIAL ULTRASOUND N/A 08/06/2020   Procedure: VIDEO BRONCHOSCOPY WITH ENDOBRONCHIAL ULTRASOUND;  Surgeon: Collene Gobble, MD;  Location[facility] ENDOSCOPY;  Service: Pulmonary;  Laterality: N/A;    Family History  Problem Relation Age of Onset  . Heart disease Mother   . Coronary artery disease Father   . Cancer Maternal Grandmother   . Heart  Problems Maternal Grandfather   . Diabetes Son        borderline     Social History Social History   Tobacco Use  . Smoking status: Former Smoker    Packs/day: 2.00    Years: 20.00    Pack years: 40.00    Types: Cigarettes    Quit date: 01/25/1974    Years since quitting: 46.6  . Smokeless tobacco: Never Used  Vaping Use  . Vaping Use: Never used  Substance Use Topics  . Alcohol use: No    Alcohol/week: 0.0 standard drinks  . Drug use: No    Allergies  Allergen Reactions  . Actos [Pioglitazone] Swelling  . Metformin And Related Nausea Only  . Niaspan [Niacin Er] Itching and Rash    Current Outpatient Medications  Medication Sig Dispense Refill  . allopurinol (ZYLOPRIM) 300 MG tablet Take 300 mg by mouth daily.   2  . amitriptyline (ELAVIL) 50 MG tablet Take 50 mg by mouth at bedtime.     Marland Kitchen apixaban (ELIQUIS) 5 MG TABS tablet Take 1 tablet (5 mg total) by mouth 2 (two) times daily. You may restart this medication on Thursday, 08/08/2020. 60 tablet 11  . aspirin 81 MG tablet Take 1 tablet (81 mg total) by mouth daily.    . Blood Glucose Monitoring Suppl (CONTOUR NEXT MONITOR) w/Device KIT 1 Device by Does not apply route 3 (three) times daily. Use to check blood sugars 3 times daily. Dx Code E13.9 1 kit 2  . colchicine 0.6 MG tablet Take 0.6 mg by mouth daily as needed (gout flare).     . CONTOUR NEXT TEST test strip USE 1 STRIP TO CHECK GLUCOSE 4 TIMES DAILY 150 each 0  . donepezil (ARICEPT) 10 MG tablet TAKE ONE TABLET DAILY.  PLEASE CALL [phone] TO SCHEDULE FOLLOW UP. (Patient taking differently: Take 10 mg by mouth daily. ) 90 tablet 0  . furosemide (LASIX) 40 MG tablet Take 80 mg by mouth in the morning.     . insulin degludec (TRESIBA FLEXTOUCH) 200 UNIT/ML FlexTouch Pen Inject 46 Units into the skin at bedtime.    . insulin lispro (HUMALOG) 100 UNIT/ML cartridge Inject 5-15 Units into the skin 3 (three) times daily with meals.    . Insulin Pen Needle (EASY COMFORT  PEN NEEDLES) 33G X 4 MM MISC 1 each by Does not apply route See admin instructions. Use to inject insulin 5 times daily. 150 each 3  . levothyroxine (SYNTHROID, LEVOTHROID) 75 MCG tablet Take 75 mcg by mouth daily before breakfast.   1  . liraglutide (VICTOZA) 18 MG/3ML SOPN Inject 1.2 mg into the skin daily.     Marland Kitchen  lisinopril (ZESTRIL) 10 MG tablet TAKE ONE TABLET DAILY 90 tablet 2  . metFORMIN (GLUCOPHAGE-XR) 750 MG 24 hr tablet TAKE 1 TABLET WITH DINNER 90 tablet 0  . metoprolol succinate (TOPROL-XL) 25 MG 24 hr tablet Take 1 tablet (25 mg total) by mouth daily. 90 tablet 3  . NON FORMULARY Inhale 1 application into the lungs at bedtime. CPAP    . potassium chloride SA (K-DUR,KLOR-CON) 20 MEQ tablet Take 20 mEq by mouth 2 (two) times daily.     . rosuvastatin (CRESTOR) 20 MG tablet Take 20 mg by mouth daily.    . Tamsulosin HCl (FLOMAX) 0.4 MG CAPS Take 0.4 mg by mouth daily.     Marland Kitchen torsemide (DEMADEX) 10 MG tablet Take 10 mg by mouth every evening.     . vitamin B-12 (CYANOCOBALAMIN) 1000 MCG tablet Take 1,000 mcg by mouth daily.     No current facility-administered medications for this visit.    Review of Systems  Constitutional: positive for fatigue and weight loss Eyes: negative Ears, nose, mouth, throat, and face: negative Respiratory: positive for cough and dyspnea on exertion Cardiovascular: negative Gastrointestinal: negative Genitourinary:negative Integument/breast: negative Hematologic/lymphatic: negative Musculoskeletal:positive for muscle weakness Neurological: negative Behavioral/Psych: negative Endocrine: negative Allergic/Immunologic: negative  Physical Exam  EZM:OQHUT, healthy, no distress, well nourished and well developed SKIN: skin color, texture, turgor are normal, no rashes or significant lesions HEAD: Normocephalic, No masses, lesions, tenderness or abnormalities EYES: normal, PERRLA, Conjunctiva are pink and non-injected EARS: External ears normal,  Canals clear OROPHARYNX:no exudate, no erythema and lips, buccal mucosa, and tongue normal  NECK: supple, no adenopathy, no JVD LYMPH:  no palpable lymphadenopathy, no hepatosplenomegaly LUNGS: clear to auscultation , and palpation HEART: regular rate & rhythm, no murmurs and no gallops ABDOMEN:abdomen soft, non-tender, normal bowel sounds and no masses or organomegaly BACK: No CVA tenderness, Range of motion is normal EXTREMITIES:no joint deformities, effusion, or inflammation, no edema  NEURO: alert & oriented x 3 with fluent speech, no focal motor/sensory deficits  PERFORMANCE STATUS: ECOG 1  LABORATORY DATA: Lab Results  Component Value Date   WBC 10.2 08/20/2020   HGB 15.1 08/20/2020   HCT 46.7 08/20/2020   MCV 91.6 08/20/2020   PLT 198 08/20/2020      Chemistry      Component Value Date/Time   NA 140 08/20/2020 1437   K 4.4 08/20/2020 1437   CL 102 08/20/2020 1437   CO2 30 08/20/2020 1437   BUN 19 08/20/2020 1437   CREATININE 1.42 (H) 08/20/2020 1437   CREATININE 1.30 (H) 04/30/2020 1044      Component Value Date/Time   CALCIUM 9.5 08/20/2020 1437   ALKPHOS 89 08/20/2020 1437   AST 10 (L) 08/20/2020 1437   ALT 10 08/20/2020 1437   BILITOT 0.7 08/20/2020 1437       RADIOGRAPHIC STUDIES: MR BRAIN WO CONTRAST  Result Date: 08/09/2020  [facility] [address], [place], [place] [address] [phone] NEUROIMAGING REPORT STUDY DATE: 08/08/2020 PATIENT NAME: Randall Wilkins DOB: March 28, 1941 MRN: [id number] EXAM: MRI Brain without contrast ORDERING CLINICIAN: Butler Denmark, NP CLINICAL HISTORY: 79 year old man with memory loss COMPARISON FILMS: 11/30/2012 MRI and 09/28/2006 CT scan TECHNIQUE: MRI of the brain without contrast was obtained utilizing 5 mm axial slices with T1, T2, T2 flair, SWI and diffusion weighted views.  T1 sagittal and T2 coronal views were obtained. CONTRAST: none IMAGING SITE: [facility], [place], [place]  FINDINGS: On sagittal images, the spinal  cord is imaged caudally to C3 and is normal in caliber.   The contents of the posterior fossa are of normal size and position.   The pituitary gland and optic chiasm appear normal.    Moderate generalized cortical atrophy that has progressed significantly compared to the 2013 MRI.Marland Kitchen  There are no abnormal extra-axial collections of fluid.  There is a remote left parietal stroke with associated encephalomalacia and gliosis, similar in appearance to the 2013 MRI.  It was also present on the 2007 CT scan.  Elsewhere in the hemispheres, there are scattered T2/FLAIR hyperintense foci consistent with chronic microvascular ischemic change, mildly progressed compared to the 2013 MRI.  The cerebellum and brainstem appears normal.   The deep gray matter appears normal.  The cerebral hemispheres appear normal.  Diffusion weighted images are normal.  Susceptibility weighted images show a focus of susceptibility in the posterior right thalamus, unchanged compared to the 2013 MRI.  This region appeared calcified on CT scan from 09/28/2006.  This could represent dystrophic calcification associated with a remote hemorrhagic infarction.  Additionally, there is a chronic microhemorrhage in the anterior left thalamus also noted on the 2013 MRI. There have been bilateral lens replacements. The orbits appear normal.   The VIIth/VIIIth nerve complex appears normal.  The mastoid air cells appear normal.  The left maxillary sinus is hypoplastic.  The other paranasal sinuses appear normal.  Flow voids are identified within the major intracerebral arteries.     This MRI of the brain without contrast shows the following: 1.   Moderate generalized cortical atrophy that has significantly progressed compared to the 2013 MRI. 2.   Remote left parietal infarction with associated encephalomalacia and gliosis.  This was seen on the 2013 MRI. 3.   T2/FLAIR hyperintense foci in the hemispheres consistent with  chronic microvascular ischemic change 4.   Changes on susceptibility weighted images consistent with calcification possibly related to a remote hemorrhage in the posterior thalamus that was noted on the 2007 CT scan.  The appearance of these images is unchanged compared to the 2013 MRI.  There is a chronic microhemorrhage in the anterior left thalamus, also noted on the previous MRI. 5.   There were no acute findings. INTERPRETING PHYSICIAN: Richard A. Felecia Shelling, MD, PhD, FAAN Certified in  Neuroimaging by [employer]   DG Chest [place] 1 View  Result Date: 08/06/2020 CLINICAL DATA:  Status post bronchoscopy, biopsy EXAM: PORTABLE CHEST 1 VIEW COMPARISON:  Chest radiographs, 04/29/2020, CT chest, 06/21/2020 FINDINGS: Low volume AP portable examination with large bilateral masses, as seen on prior examinations. No acute appearing airspace opacity or pneumothorax. Cardiomegaly status post median sternotomy. IMPRESSION: 1. Low volume AP portable examination with large bilateral masses, as seen on prior examinations. No acute appearing airspace opacity or pneumothorax. 2.  Cardiomegaly status post median sternotomy. Electronically Signed   By: Eddie Candle M.D.   On: 08/06/2020 09:58   DG C-ARM BRONCHOSCOPY  Result Date: 08/06/2020 C-ARM BRONCHOSCOPY: Fluoroscopy was utilized by the requesting physician.  No radiographic interpretation.    ASSESSMENT: This is a very pleasant 79 years old white male recently diagnosed with stage IV (T4, N2, M1 a) non-small cell lung cancer, mucinous adenocarcinoma presented with bilateral large masses in the left upper lobe as well as the right lower lobe in addition to other small nodules in the right upper lobe with hilar and mediastinal lymphadenopathy diagnosed in August 2021.   PLAN: I had a lengthy discussion with the  patient and his wife today about his current disease stage, prognosis and treatment options. I strongly recommend for the patient to keep  his appointment for the PET scan on 08/22/2020 to complete the staging work-up. I also recommend for the patient to have blood test sent to [facility] for molecular studies and if negative we may consider testing the tissue. I had a lengthy discussion with the patient about his treatment options.  I explained to him that he has incurable condition and all the treatment will be of palliative nature. I gave the patient the option of palliative care versus palliative systemic chemotherapy with carboplatin for AUC of 5, Alimta 500 mg/M2 and Keytruda 200 mg IV every 3 weeks if he has no actionable mutations. If the patient has an actionable mutation, he will be treated with targeted therapy. I will arrange for the patient to come back for follow-up visit in around 2 weeks for evaluation and discussion of his treatment options in details based on the molecular studies. For the multiple other medical problems, the patient will continue his routine follow-up visit and evaluation by his primary care physician. He was advised to call immediately if he has any other concerning symptoms in the interval. The patient voices understanding of current disease status and treatment options and is in agreement with the current care plan.  All questions were answered. The patient knows to call the clinic with any problems, questions or concerns. We can certainly see the patient much sooner if necessary.  Thank you so much for allowing me to participate in the care of Elfego S Wilkins. I will continue to follow up the patient with you and assist in his care.  The total time spent in the appointment was 90 minutes.  Disclaimer: This note was dictated with voice recognition software. Similar sounding words can inadvertently be transcribed and may not be corrected upon review.   Eilleen Kempf August 20, 2020, 3:40 PM

## 2020-08-21 ENCOUNTER — Ambulatory Visit: Payer: Medicare Other

## 2020-08-21 DIAGNOSIS — E1149 Type 2 diabetes mellitus with other diabetic neurological complication: Secondary | ICD-10-CM | POA: Diagnosis not present

## 2020-08-21 DIAGNOSIS — I10 Essential (primary) hypertension: Secondary | ICD-10-CM | POA: Diagnosis not present

## 2020-08-21 DIAGNOSIS — N183 Chronic kidney disease, stage 3 unspecified: Secondary | ICD-10-CM | POA: Diagnosis not present

## 2020-08-21 DIAGNOSIS — E1129 Type 2 diabetes mellitus with other diabetic kidney complication: Secondary | ICD-10-CM | POA: Diagnosis not present

## 2020-08-22 ENCOUNTER — Other Ambulatory Visit: Payer: Self-pay | Admitting: *Deleted

## 2020-08-22 ENCOUNTER — Other Ambulatory Visit: Payer: Self-pay

## 2020-08-22 ENCOUNTER — Encounter (HOSPITAL_COMMUNITY)
Admission: RE | Admit: 2020-08-22 | Discharge: 2020-08-22 | Disposition: A | Discharge status: Home / Self Care | Payer: Medicare Other | Source: Ambulatory Visit | Attending: Emergency Medicine | Admitting: Emergency Medicine

## 2020-08-22 DIAGNOSIS — I700 Atherosclerosis of aorta: Secondary | ICD-10-CM | POA: Insufficient documentation

## 2020-08-22 DIAGNOSIS — J90 Pleural effusion, not elsewhere classified: Secondary | ICD-10-CM | POA: Diagnosis not present

## 2020-08-22 DIAGNOSIS — I2510 Atherosclerotic heart disease of native coronary artery without angina pectoris: Secondary | ICD-10-CM | POA: Diagnosis not present

## 2020-08-22 DIAGNOSIS — C3490 Malignant neoplasm of unspecified part of unspecified bronchus or lung: Secondary | ICD-10-CM | POA: Diagnosis not present

## 2020-08-22 LAB — GLUCOSE, CAPILLARY: Glucose-Capillary: 115 mg/dL — ABNORMAL HIGH (ref 70–99)

## 2020-08-22 MED ORDER — FLUDEOXYGLUCOSE F - 18 (FDG) INJECTION
12.2000 | Freq: Once | INTRAVENOUS | Status: AC
  Administered 2020-08-22: 12.2 via INTRAVENOUS

## 2020-08-22 NOTE — Progress Notes (Signed)
The proposed treatment discussed in cancer conference is for discussion purpose only and is not a binding recommendation. The patient was not physically examined nor present for their treatment options. Therefore, final treatment plans cannot be decided.  ?

## 2020-08-26 DIAGNOSIS — C3491 Malignant neoplasm of unspecified part of right bronchus or lung: Secondary | ICD-10-CM | POA: Diagnosis not present

## 2020-08-26 DIAGNOSIS — C3492 Malignant neoplasm of unspecified part of left bronchus or lung: Secondary | ICD-10-CM | POA: Diagnosis not present

## 2020-08-27 ENCOUNTER — Other Ambulatory Visit: Payer: Medicare Other

## 2020-08-27 ENCOUNTER — Encounter: Payer: Self-pay | Admitting: Internal Medicine

## 2020-08-27 ENCOUNTER — Ambulatory Visit: Payer: Medicare Other | Admitting: Internal Medicine

## 2020-08-27 ENCOUNTER — Ambulatory Visit: Payer: Medicare Other | Attending: Internal Medicine

## 2020-08-27 ENCOUNTER — Telehealth: Payer: Self-pay | Admitting: Internal Medicine

## 2020-08-27 DIAGNOSIS — Z23 Encounter for immunization: Secondary | ICD-10-CM

## 2020-08-27 LAB — GUARDANT 360

## 2020-08-27 NOTE — Progress Notes (Signed)
   Covid-19 Vaccination Clinic  Name:  Randall Wilkins    MRN: [id number] DOB: 27-May-1941  08/27/2020  [name] was observed post Covid-19 immunization for 15 minutes without incident. He was provided with Vaccine Information Sheet and instruction to access the V-Safe system.   [name] was instructed to call 911 with any severe reactions post vaccine: Marland Kitchen Difficulty breathing  . Swelling of face and throat  . A fast heartbeat  . A bad rash all over body  . Dizziness and weakness

## 2020-08-27 NOTE — Telephone Encounter (Signed)
Scheduled appt per 9/14 sch msg - pt wife is aware of appt date and time

## 2020-08-28 ENCOUNTER — Encounter: Payer: Self-pay | Admitting: *Deleted

## 2020-08-28 NOTE — Progress Notes (Signed)
I reached out to [facility] to get an update on test results.  Wait to hear back

## 2020-08-30 ENCOUNTER — Encounter: Payer: Self-pay | Admitting: *Deleted

## 2020-08-30 NOTE — Progress Notes (Signed)
I followed up on Mr. [place] moleculars.  I printed and placed on Dr. Worthy Flank desk.

## 2020-08-30 NOTE — Progress Notes (Signed)
I printed [facility] test results and placed on Dr. Worthy Flank desk.

## 2020-09-02 ENCOUNTER — Encounter: Payer: Self-pay | Admitting: Internal Medicine

## 2020-09-02 ENCOUNTER — Inpatient Hospital Stay: Payer: Medicare Other | Admitting: Internal Medicine

## 2020-09-02 ENCOUNTER — Other Ambulatory Visit: Payer: Self-pay

## 2020-09-02 ENCOUNTER — Telehealth: Payer: Self-pay | Admitting: Medical Oncology

## 2020-09-02 ENCOUNTER — Telehealth: Payer: Self-pay | Admitting: Internal Medicine

## 2020-09-02 ENCOUNTER — Other Ambulatory Visit: Payer: Self-pay | Admitting: Medical Oncology

## 2020-09-02 ENCOUNTER — Inpatient Hospital Stay: Payer: Medicare Other

## 2020-09-02 ENCOUNTER — Encounter: Payer: Self-pay | Admitting: *Deleted

## 2020-09-02 VITALS — BP 143/91 | HR 60 | Temp 97.2°F | Resp 17 | Ht 67.0 in | Wt 236.7 lb

## 2020-09-02 DIAGNOSIS — Z5112 Encounter for antineoplastic immunotherapy: Secondary | ICD-10-CM

## 2020-09-02 DIAGNOSIS — C3490 Malignant neoplasm of unspecified part of unspecified bronchus or lung: Secondary | ICD-10-CM

## 2020-09-02 DIAGNOSIS — C3491 Malignant neoplasm of unspecified part of right bronchus or lung: Secondary | ICD-10-CM

## 2020-09-02 DIAGNOSIS — Z5111 Encounter for antineoplastic chemotherapy: Secondary | ICD-10-CM

## 2020-09-02 DIAGNOSIS — C3412 Malignant neoplasm of upper lobe, left bronchus or lung: Secondary | ICD-10-CM | POA: Diagnosis not present

## 2020-09-02 DIAGNOSIS — Z7189 Other specified counseling: Secondary | ICD-10-CM | POA: Diagnosis not present

## 2020-09-02 LAB — CBC WITH DIFFERENTIAL (CANCER CENTER ONLY)
Abs Immature Granulocytes: 0.03 10*3/uL (ref 0.00–0.07)
Basophils Absolute: 0.1 10*3/uL (ref 0.0–0.1)
Basophils Relative: 1 %
Eosinophils Absolute: 0.1 10*3/uL (ref 0.0–0.5)
Eosinophils Relative: 1 %
HCT: 47.7 % (ref 39.0–52.0)
Hemoglobin: 15.4 g/dL (ref 13.0–17.0)
Immature Granulocytes: 0 %
Lymphocytes Relative: 18 %
Lymphs Abs: 1.4 10*3/uL (ref 0.7–4.0)
MCH: 29.6 pg (ref 26.0–34.0)
MCHC: 32.3 g/dL (ref 30.0–36.0)
MCV: 91.7 fL (ref 80.0–100.0)
Monocytes Absolute: 0.8 10*3/uL (ref 0.1–1.0)
Monocytes Relative: 10 %
Neutro Abs: 5.4 10*3/uL (ref 1.7–7.7)
Neutrophils Relative %: 70 %
Platelet Count: 139 10*3/uL — ABNORMAL LOW (ref 150–400)
RBC: 5.2 MIL/uL (ref 4.22–5.81)
RDW: 15 % (ref 11.5–15.5)
WBC Count: 7.7 10*3/uL (ref 4.0–10.5)
nRBC: 0 % (ref 0.0–0.2)

## 2020-09-02 LAB — CMP (CANCER CENTER ONLY)
ALT: 11 U/L (ref 0–44)
AST: 16 U/L (ref 15–41)
Albumin: 3.3 g/dL — ABNORMAL LOW (ref 3.5–5.0)
Alkaline Phosphatase: 85 U/L (ref 38–126)
Anion gap: 8 (ref 5–15)
BUN: 20 mg/dL (ref 8–23)
CO2: 31 mmol/L (ref 22–32)
Calcium: 9.5 mg/dL (ref 8.9–10.3)
Chloride: 102 mmol/L (ref 98–111)
Creatinine: 1.39 mg/dL — ABNORMAL HIGH (ref 0.61–1.24)
GFR, Est AFR Am: 55 mL/min — ABNORMAL LOW (ref >60)
GFR, Estimated: 48 mL/min — ABNORMAL LOW (ref >60)
Glucose, Bld: 135 mg/dL — ABNORMAL HIGH (ref 70–99)
Potassium: 3.9 mmol/L (ref 3.5–5.1)
Sodium: 141 mmol/L (ref 135–145)
Total Bilirubin: 0.8 mg/dL (ref 0.3–1.2)
Total Protein: 6.6 g/dL (ref 6.5–8.1)

## 2020-09-02 MED ORDER — CYANOCOBALAMIN 1000 MCG/ML IJ SOLN
INTRAMUSCULAR | Status: AC
  Filled 2020-09-02: qty 1

## 2020-09-02 MED ORDER — CYANOCOBALAMIN 1000 MCG/ML IJ SOLN
1000.0000 ug | Freq: Once | INTRAMUSCULAR | Status: AC
  Administered 2020-09-02: 1000 ug via INTRAMUSCULAR

## 2020-09-02 MED ORDER — FOLIC ACID 1 MG PO TABS: 1.0000 mg | ORAL_TABLET | Freq: Every day | ORAL | 4 refills | Status: DC

## 2020-09-02 MED ORDER — MIRTAZAPINE 30 MG PO TABS: 30.0000 mg | ORAL_TABLET | Freq: Every day | ORAL | 2 refills | Status: DC

## 2020-09-02 MED ORDER — PROCHLORPERAZINE MALEATE 10 MG PO TABS: 10.0000 mg | ORAL_TABLET | Freq: Four times a day (QID) | ORAL | 0 refills | Status: DC | PRN

## 2020-09-02 NOTE — Telephone Encounter (Signed)
Wife said she picked up mirtazepine from the pharmacy . Can he take it  if he takes  amytriptilline HS?    Marland Kitchen

## 2020-09-02 NOTE — Telephone Encounter (Signed)
Scheduled per 9/20 los. Printed avs and calendar for pt.

## 2020-09-02 NOTE — Progress Notes (Signed)
I spoke to Mr and Ms. Martinique today at clinic.  He is newly dx stage IV lung cancer.  He is in today to discuss treatment.  I help to explain and give information regarding his plan of care and medications.

## 2020-09-02 NOTE — Telephone Encounter (Signed)
I decided not to continue with the prescription after I found that he is already on amitriptyline.  Thank you.

## 2020-09-02 NOTE — Progress Notes (Signed)
START ON PATHWAY REGIMEN - Non-Small Cell Lung     A cycle is every 21 days:     Pembrolizumab      Pemetrexed      Carboplatin   **Always confirm dose/schedule in your pharmacy ordering system**  Patient Characteristics: Stage IV Metastatic, Nonsquamous, Initial Chemotherapy/Immunotherapy, PS = 0, 1, ALK Rearrangement Negative and ROS1 Rearrangement Negative and NTRK Gene Fusion?Negative and RET Gene Fusion?Negative and EGFR Mutation Negative, PD-L1 Expression Positive  1-49% (TPS) / Negative / Not Tested / Awaiting Test Results and Immunotherapy Candidate Therapeutic Status: Stage IV Metastatic Histology: Nonsquamous Cell ROS1 Rearrangement Status: Negative Other Mutations/Biomarkers: No Other Actionable Mutations Chemotherapy/Immunotherapy LOT: Initial Chemotherapy/Immunotherapy Molecular Targeted Therapy: Not Appropriate KRAS G12C Mutation Status: G12C Positive MET Exon 14 Mutation Status: Negative RET Gene Fusion Status: Negative EGFR Mutation Status: Negative/Wild Type NTRK Gene Fusion Status: Negative PD-L1 Expression Status: PD-L1 Positive 1-49% (TPS) ALK Rearrangement Status: Negative BRAF V600E Mutation Status: Negative ECOG Performance Status: 1 Biomarker Assessment Status Confirmation: All Genomic Markers Negative, or Only MET+ or BRAF+ or KRAS G12C+ Immunotherapy Candidate Status: Candidate for Immunotherapy Intent of Therapy: Non-Curative / Palliative Intent, Discussed with Patient

## 2020-09-02 NOTE — Progress Notes (Signed)
[facility] [phone]   [phone]  OFFICE PROGRESS NOTE  [name], MD [address] [place] [address]  DIAGNOSIS: Stage IV (T4, N2, M1 a) non-small cell lung cancer, mucinous adenocarcinoma presented with bilateral large masses in the left upper lobe as well as the right lower lobe in addition to other small nodules in the right upper lobe with hilar and mediastinal lymphadenopathy diagnosed in August 2021.  Biomarker Findings Microsatellite status - MS-Stable Tumor Mutational Burden - 4 Muts/Mb Genomic Findings For a complete list of the genes assayed, please refer to the Appendix. KRAS G12C GNAS R201H 7 Disease relevant genes with no reportable alterations: ALK, BRAF, EGFR, ERBB2, MET, RET, ROS1  PDL1 Expression: 20%.  PRIOR THERAPY: None  CURRENT THERAPY: Systemic chemotherapy with carboplatin for AUC of 5, Alimta 500 mg/M2 and Keytruda 200 mg IV every 3 weeks.  First dose 09/09/2020.  INTERVAL HISTORY: Margie S Martinique 79 y.o. male returns to the clinic today for follow-up visit accompanied by his wife.  The patient is feeling fine today with no concerning complaints except for the fatigue and shortness of breath with exertion.  He denied having any current chest pain or hemoptysis.  He denied having any fever or chills.  He has no nausea, vomiting, diarrhea or constipation.  He denied having any headache or visual changes.  He has no recent weight loss or night sweats.  The patient had molecular studies by foundation 1 that showed positive KRAS G12C mutation.  His PD-L1 expression was 20%.  The patient is here today for evaluation and discussion of his treatment options based on the recent molecular studies.  MEDICAL HISTORY: Past Medical History:  Diagnosis Date  . Aortic stenosis, mild 11/14/2013  . Aortic valve sclerosis 03/29/2015  . Bilateral leg edema 05/21/2014  . CAD (coronary artery disease)   . Chronic  diastolic congestive heart failure ([place])   . Chronic kidney disease (CKD), stage III (moderate)   . Diabetes 1.5, managed as type 1 ([place]) 02/04/2013  . Diabetic peripheral neuropathy associated with type 1 diabetes mellitus ([place]) 11/14/2013  . Dysphagia   . Dyspnea on exertion 03/21/2015  . Exogenous obesity   . Gout   . Heart attack ([place])   . History of nuclear stress test 07/2011   dipyridamole; fixed inferolateral defect, worse at stress than rest; no reversible ischemia; low risk scan   . Hyperlipidemia   . Hypertension   . Hypothyroidism   . Insulin dependent diabetes mellitus   . Left foot drop   . Left main coronary artery disease 03/28/2015  . Memory loss   . Obesity (BMI 30-39.9) 11/14/2013  . Obstructive sleep apnea 03/21/2015  . OSA on CPAP    uses a cpap  . Peptic ulcer with hemorrhage 03/28/2015  . Peripheral neuropathy   . Pneumonia   . Rhabdomyolysis   . 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 Stroke ([place])    L patietal with small scattered lacunar infarcts  . Thrombocytopenia ([place]) 03/21/2015  . Venous insufficiency   . Weakness generalized 03/21/2015    ALLERGIES:  is allergic to actos [pioglitazone], metformin and related, and niaspan [niacin er].  MEDICATIONS:  Current Outpatient Medications  Medication Sig Dispense Refill  . allopurinol (ZYLOPRIM) 300 MG tablet Take 300 mg by mouth daily.   2  . amitriptyline (ELAVIL) 50 MG tablet  Take 50 mg by mouth at bedtime.     Marland Kitchen apixaban (ELIQUIS) 5 MG TABS tablet Take 1 tablet (5 mg total) by mouth 2 (two) times daily. You may restart this medication on Thursday, 08/08/2020. 60 tablet 11  . aspirin 81 MG tablet Take 1 tablet (81 mg total) by mouth daily.    . Blood Glucose Monitoring Suppl (CONTOUR NEXT MONITOR) w/Device KIT 1 Device by Does not apply route 3 (three) times daily. Use to check blood sugars 3 times daily. Dx Code E13.9 1 kit 2  . colchicine 0.6 MG tablet Take 0.6 mg by mouth  daily as needed (gout flare).     . CONTOUR NEXT TEST test strip USE 1 STRIP TO CHECK GLUCOSE 4 TIMES DAILY 150 each 0  . donepezil (ARICEPT) 10 MG tablet TAKE ONE TABLET DAILY.  PLEASE CALL [phone] TO SCHEDULE FOLLOW UP. (Patient taking differently: Take 10 mg by mouth daily. ) 90 tablet 0  . furosemide (LASIX) 40 MG tablet Take 80 mg by mouth in the morning.     . insulin degludec (TRESIBA FLEXTOUCH) 200 UNIT/ML FlexTouch Pen Inject 46 Units into the skin at bedtime.    . insulin lispro (HUMALOG) 100 UNIT/ML cartridge Inject 5-15 Units into the skin 3 (three) times daily with meals.    . Insulin Pen Needle (EASY COMFORT PEN NEEDLES) 33G X 4 MM MISC 1 each by Does not apply route See admin instructions. Use to inject insulin 5 times daily. 150 each 3  . levothyroxine (SYNTHROID, LEVOTHROID) 75 MCG tablet Take 75 mcg by mouth daily before breakfast.   1  . liraglutide (VICTOZA) 18 MG/3ML SOPN Inject 1.2 mg into the skin daily.     Marland Kitchen lisinopril (ZESTRIL) 10 MG tablet TAKE ONE TABLET DAILY 90 tablet 2  . metFORMIN (GLUCOPHAGE-XR) 750 MG 24 hr tablet TAKE 1 TABLET WITH DINNER 90 tablet 0  . metoprolol succinate (TOPROL-XL) 25 MG 24 hr tablet Take 1 tablet (25 mg total) by mouth daily. 90 tablet 3  . NON FORMULARY Inhale 1 application into the lungs at bedtime. CPAP    . potassium chloride SA (K-DUR,KLOR-CON) 20 MEQ tablet Take 20 mEq by mouth 2 (two) times daily.     . rosuvastatin (CRESTOR) 20 MG tablet Take 20 mg by mouth daily.    . Tamsulosin HCl (FLOMAX) 0.4 MG CAPS Take 0.4 mg by mouth daily.     Marland Kitchen torsemide (DEMADEX) 10 MG tablet Take 10 mg by mouth every evening.     . vitamin B-12 (CYANOCOBALAMIN) 1000 MCG tablet Take 1,000 mcg by mouth daily.     No current facility-administered medications for this visit.    SURGICAL HISTORY:  Past Surgical History:  Procedure Laterality Date  . BACK SURGERY  2002   lumbosacral. 11 back surgeries total  . BRONCHIAL BIOPSY  08/06/2020    Procedure: BRONCHIAL BIOPSIES;  Surgeon: Collene Gobble, MD;  Location: [facility] ENDOSCOPY;  Service: Pulmonary;;  . BRONCHIAL BRUSHINGS  08/06/2020   Procedure: BRONCHIAL BRUSHINGS;  Surgeon: Collene Gobble, MD;  Location: [facility] ENDOSCOPY;  Service: Pulmonary;;  . BRONCHIAL NEEDLE ASPIRATION BIOPSY  08/06/2020   Procedure: BRONCHIAL NEEDLE ASPIRATION BIOPSIES;  Surgeon: Collene Gobble, MD;  Location: [facility] ENDOSCOPY;  Service: Pulmonary;;  . Carotid Doppler  03/2013   bilat bulb/prox ICAs - mild amount of fibrous plaque with no evidence of diameter reduction  . CARPAL TUNNEL RELEASE Bilateral 08/09/2014   Procedure: BILATERAL CARPAL TUNNEL RELEASE;  Surgeon:  Daryll Brod, MD;  Location: [facility];  Service: Orthopedics;  Laterality: Bilateral;  ANESTHESIA:  IV REGIONAL BIL FAB  . CHOLECYSTECTOMY    . COLONOSCOPY    . CORONARY ANGIOPLASTY  10/13/1996  . CORONARY ANGIOPLASTY  09/21/1989   emergency PTCA  . CORONARY ANGIOPLASTY  10/13/1996   Multi-Link diagonal & OD stenting (Dr. Marella Chimes)  . CORONARY ANGIOPLASTY  12/03/1997   disease of mid DX-1 ~50% & in mid PLA & PDA (distal lesions) (Dr. Marella Chimes)   . CORONARY ANGIOPLASTY  10/14/1999   progression of disease distal PLA & PDA; progression of disease prox RCA - moderate (Dr. Marella Chimes)   . CORONARY ANGIOPLASTY WITH STENT PLACEMENT  04/04/2004   4.0x71m non-DES (thrombectomy via AngioJet) to RCA for high grade stenosis (Dr. RMarella Chimes  . CORONARY ARTERY BYPASS GRAFT N/A 03/29/2015   Procedure: CORONARY ARTERY BYPASS GRAFTING TIMES TWO USING LEFT INTERNAL MAMMARY ARTERY AND RIGHT LEG GREATER SAPHENOUS VEIN HARVESTED ENDOSCOPICALLY.;  Surgeon: CRexene Alberts MD;  [facility]  Service: Open Heart Surgery;  Laterality: N/A;  . ESOPHAGOGASTRODUODENOSCOPY N/A 03/27/2015   Procedure: ESOPHAGOGASTRODUODENOSCOPY (EGD);  Surgeon: MClarene Essex MD;  [facility];  Service: Endoscopy;  Laterality: N/A;  possible dilation  .  FINE NEEDLE ASPIRATION  08/06/2020   Procedure: FINE NEEDLE ASPIRATION (FNA) LINEAR;  Surgeon: BCollene Gobble MD;  [facility];  Service: Pulmonary;;  . LEFT HEART CATHETERIZATION WITH CORONARY ANGIOGRAM N/A 03/28/2015   Procedure: LEFT HEART CATHETERIZATION WITH CORONARY ANGIOGRAM;  Surgeon: Peter M JMartinique MD;  [facility];  Service: Cardiovascular;  Laterality: N/A;  . SINUS ENDO W/FUSION    . TONSILLECTOMY    . TRANSTHORACIC ECHOCARDIOGRAM  08/08/2013   EF 55-60%, mild conc hypertrophy, grade 1 diastolic dysfunction; AV with mild stenosis; LA & RA mildly dilated  . VIDEO BRONCHOSCOPY WITH ENDOBRONCHIAL NAVIGATION N/A 08/06/2020   Procedure: VIDEO BRONCHOSCOPY WITH ENDOBRONCHIAL NAVIGATION;  Surgeon: BCollene Gobble MD;  [facility];  Service: Pulmonary;  Laterality: N/A;  . VIDEO BRONCHOSCOPY WITH ENDOBRONCHIAL ULTRASOUND N/A 08/06/2020   Procedure: VIDEO BRONCHOSCOPY WITH ENDOBRONCHIAL ULTRASOUND;  Surgeon: BCollene Gobble MD;  [facility];  Service: Pulmonary;  Laterality: N/A;    REVIEW OF SYSTEMS:  Constitutional: positive for fatigue Eyes: negative Ears, nose, mouth, throat, and face: negative Respiratory: positive for cough and dyspnea on exertion Cardiovascular: negative Gastrointestinal: negative Genitourinary:negative Integument/breast: negative Hematologic/lymphatic: negative Musculoskeletal:positive for muscle weakness Neurological: negative Behavioral/Psych: negative Endocrine: negative Allergic/Immunologic: negative   PHYSICAL EXAMINATION: General appearance: alert, cooperative, fatigued and no distress Head: Normocephalic, without obvious abnormality, atraumatic Neck: no adenopathy, no JVD, supple, symmetrical, trachea midline and thyroid not enlarged, symmetric, no tenderness/mass/nodules Lymph nodes: Cervical, supraclavicular, and axillary nodes normal. Resp: clear to auscultation bilaterally Back: symmetric, no  curvature. ROM normal. No CVA tenderness. Cardio: regular rate and rhythm, S1, S2 normal, no murmur, click, rub or gallop GI: soft, non-tender; bowel sounds normal; no masses,  no organomegaly Extremities: extremities normal, atraumatic, no cyanosis or edema Neurologic: Alert and oriented X 3, normal strength and tone. Normal symmetric reflexes. Normal coordination and gait  ECOG PERFORMANCE STATUS: 1 - Symptomatic but completely ambulatory  Blood pressure (!) 143/91, pulse 60, temperature (!) 97.2 F (36.2 C), temperature source Tympanic, resp. rate 17, height '5\' 7"'  (1.702 m), weight 236 lb 11.2 oz (107.4 kg), SpO2 96 %.  LABORATORY DATA: Lab Results  Component Value Date   WBC 7.7 09/02/2020  HGB 15.4 09/02/2020   HCT 47.7 09/02/2020   MCV 91.7 09/02/2020   PLT 139 (L) 09/02/2020      Chemistry      Component Value Date/Time   NA 141 09/02/2020 1033   K 3.9 09/02/2020 1033   CL 102 09/02/2020 1033   CO2 31 09/02/2020 1033   BUN 20 09/02/2020 1033   CREATININE 1.39 (H) 09/02/2020 1033   CREATININE 1.30 (H) 04/30/2020 1044      Component Value Date/Time   CALCIUM 9.5 09/02/2020 1033   ALKPHOS 85 09/02/2020 1033   AST 16 09/02/2020 1033   ALT 11 09/02/2020 1033   BILITOT 0.8 09/02/2020 1033       RADIOGRAPHIC STUDIES: MR BRAIN WO CONTRAST  Result Date: 08/09/2020  [facility] [address], [place], [place] [address] [phone] NEUROIMAGING REPORT STUDY DATE: 08/08/2020 PATIENT NAME: Mousa S Martinique DOB: 26-Aug-1941 MRN: [id number] EXAM: MRI Brain without contrast ORDERING CLINICIAN: Butler Denmark, NP CLINICAL HISTORY: 79 year old man with memory loss COMPARISON FILMS: 11/30/2012 MRI and 09/28/2006 CT scan TECHNIQUE: MRI of the brain without contrast was obtained utilizing 5 mm axial slices with T1, T2, T2 flair, SWI and diffusion weighted views.  T1 sagittal and T2 coronal views were obtained. CONTRAST: none IMAGING SITE: [facility], [place], [place] FINDINGS: On sagittal images, the spinal cord is imaged caudally to C3 and is normal in caliber.   The contents of the posterior fossa are of normal size and position.   The pituitary gland and optic chiasm appear normal.    Moderate generalized cortical atrophy that has progressed significantly compared to the 2013 MRI.Marland Kitchen  There are no abnormal extra-axial collections of fluid.  There is a remote left parietal stroke with associated encephalomalacia and gliosis, similar in appearance to the 2013 MRI.  It was also present on the 2007 CT scan.  Elsewhere in the hemispheres, there are scattered T2/FLAIR hyperintense foci consistent with chronic microvascular ischemic change, mildly progressed compared to the 2013 MRI.  The cerebellum and brainstem appears normal.   The deep gray matter appears normal.  The cerebral hemispheres appear normal.  Diffusion weighted images are normal.  Susceptibility weighted images show a focus of susceptibility in the posterior right thalamus, unchanged compared to the 2013 MRI.  This region appeared calcified on CT scan from 09/28/2006.  This could represent dystrophic calcification associated with a remote hemorrhagic infarction.  Additionally, there is a chronic microhemorrhage in the anterior left thalamus also noted on the 2013 MRI. There have been bilateral lens replacements. The orbits appear normal.   The VIIth/VIIIth nerve complex appears normal.  The mastoid air cells appear normal.  The left maxillary sinus is hypoplastic.  The other paranasal sinuses appear normal.  Flow voids are identified within the major intracerebral arteries.     This MRI of the brain without contrast shows the following: 1.   Moderate generalized cortical atrophy that has significantly progressed compared to the 2013 MRI. 2.   Remote left parietal infarction with associated encephalomalacia and gliosis.  This was seen on the 2013 MRI. 3.   T2/FLAIR hyperintense foci in the  hemispheres consistent with chronic microvascular ischemic change 4.   Changes on susceptibility weighted images consistent with calcification possibly related to a remote hemorrhage in the posterior thalamus that was noted on the 2007 CT scan.  The appearance of these images is unchanged compared to the 2013 MRI.  There is a chronic microhemorrhage in the  anterior left thalamus, also noted on the previous MRI. 5.   There were no acute findings. INTERPRETING PHYSICIAN: Richard A. Felecia Shelling, MD, PhD, FAAN Certified in  Neuroimaging by [employer]   NM PET Image Initial ([place]) Skull Base To Thigh  Result Date: 08/22/2020 CLINICAL DATA:  Initial treatment strategy for non-small cell lung cancer. EXAM: NUCLEAR MEDICINE PET SKULL BASE TO THIGH TECHNIQUE: 12.2 mCi F-18 FDG was injected intravenously. Full-ring PET imaging was performed from the skull base to thigh after the radiotracer. CT data was obtained and used for attenuation correction and anatomic localization. Fasting blood glucose: 115 mg/dl COMPARISON:  CT of the chest of June 21, 2020 FINDINGS: Mediastinal blood pool activity: SUV max 2.67 Liver activity: SUV max 3.74 NECK: No hypermetabolic lymph nodes in the neck. Incidental CT findings: None CHEST: Bulky pulmonary masses bilaterally. Largest on the LEFT in the upper lobe measures approximately 9.8 x 5 cm (SUVmax = 12.0) Second area of hypermetabolic activity in the LEFT chest is in the LEFT infrahilar region measuring approximately 3.5 by 1.7 cm (SUVmax = 9.8) this is in the LEFT lower lobe/LEFT infrahilar region Cavitary mass in the RIGHT chest measuring 5.9 x 4.1 cm adjacent to the RIGHT hilum involving RIGHT upper and RIGHT lower lobe. Th (SUVmax = 13.2 increased metabolic activity tracks from the more masslike area in the upper lobe into the lower lobe just above the RIGHT hemidiaphragm.) No hypermetabolic lymph nodes in the chest. Incidental CT findings: Post median sternotomy for  CABG. Extensive calcified coronary artery disease. No pericardial effusion. Mild cardiac enlargement. Small RIGHT pleural effusion. ABDOMEN/PELVIS: No abnormal hypermetabolic activity within the liver, pancreas, adrenal glands, or spleen. No hypermetabolic lymph nodes in the abdomen or pelvis. Incidental CT findings: Liver is unremarkable on CT. Post cholecystectomy. Spleen normal size and contour. No acute peripancreatic inflammation. No sign of hydronephrosis. Lower pole cyst of the RIGHT kidney. Atheromatous plaque in the abdominal aorta without aneurysmal dilation. Prostate unremarkable by CT imaging. SKELETON: No focal hypermetabolic activity to suggest skeletal metastasis. Incidental CT findings: Partially calcified lobulated soft tissue mass about the LEFT gluteal region in the subcutaneous fat primarily but also affecting LEFT gluteal musculature 10 x 6 cm displaying faint metabolic activity at the periphery which is similar to blood pool activity. IMPRESSION: 1. Bilateral pulmonary masses and consolidative changes with marked hypermetabolic activity compatible with pulmonary neoplasm with multifocal/bilateral involvement. 2. LEFT infrahilar adenopathy versus parenchymal disease. 3. No mediastinal adenopathy or distant disease. 4. Partially calcified soft tissue mass in the LEFT gluteal region with central lack of uptake and mild peripheral uptake is likely sequela of prior trauma or infection. Given size and other findings on the current scan biopsy may however be helpful to exclude neoplasm. This does not have an appearance that would be compatible with metastatic disease comparison with previous imaging could also be helpful. Aortic Atherosclerosis (ICD10-I70.0). Electronically Signed   By: Zetta Bills M.D.   On: 08/22/2020 15:37   DG Chest Port 1 View  Result Date: 08/06/2020 CLINICAL DATA:  Status post bronchoscopy, biopsy EXAM: PORTABLE CHEST 1 VIEW COMPARISON:  Chest radiographs, 04/29/2020, CT  chest, 06/21/2020 FINDINGS: Low volume AP portable examination with large bilateral masses, as seen on prior examinations. No acute appearing airspace opacity or pneumothorax. Cardiomegaly status post median sternotomy. IMPRESSION: 1. Low volume AP portable examination with large bilateral masses, as seen on prior examinations. No acute appearing airspace opacity or pneumothorax. 2.  Cardiomegaly status post median sternotomy.  Electronically Signed   By: Eddie Candle M.D.   On: 08/06/2020 09:58   DG C-ARM BRONCHOSCOPY  Result Date: 08/06/2020 C-ARM BRONCHOSCOPY: Fluoroscopy was utilized by the requesting physician.  No radiographic interpretation.    ASSESSMENT AND PLAN: This is a very pleasant 79 years old white male diagnosed with a stage IV (T4, N2, M1 a) non-small cell lung cancer, mucinous adenocarcinoma presented with bilateral large masses in the left upper lobe as well as the right upper lobe in addition to other pulmonary nodules in the right upper lobe with hilar and mediastinal lymphadenopathy diagnosed in August 2021. Molecular study showed positive KRAS G12C mutation and PD-L1 expression of 20%. I had a lengthy discussion with the patient and his wife today about his current condition and treatment options. I gave the patient the option of palliative care and hospice referral versus palliative systemic chemotherapy with carboplatin for AUC of 5, Alimta 500 mg/M2 and Keytruda 200 mg IV every 3 weeks.  The patient is interested in proceeding with the systemic chemotherapy. He is expected to start the first cycle of this treatment on 09/09/2020. I discussed with him the adverse effect of this treatment including but not limited to alopecia, myelosuppression, nausea and vomiting, peripheral neuropathy, liver or renal dysfunction in addition to the immunotherapy adverse effect. If the patient has evidence for disease progression or intolerance to the first-line treatment, he would be considered  for treatment with Lumakras (Sotorasib) because of the positive KRAS G12C mutation. The patient will have a chemotherapy education class before the first dose of his treatment. He will receive vitamin B12 injection today. I will call his pharmacy with prescription for Compazine 10 mg p.o. every 6 hours as needed for nausea, and folic acid 1 mg p.o. daily. The patient will come back for follow-up visit in 2 weeks for evaluation and management of any adverse effect of his treatment. He was advised to call immediately if he has any concerning symptoms in the interval. The patient voices understanding of current disease status and treatment options and is in agreement with the current care plan.  All questions were answered. The patient knows to call the clinic with any problems, questions or concerns. We can certainly see the patient much sooner if necessary.  The total time spent in the appointment was 50 minutes.  Disclaimer: This note was dictated with voice recognition software. Similar sounding words can inadvertently be transcribed and may not be corrected upon review.

## 2020-09-03 ENCOUNTER — Telehealth: Payer: Self-pay | Admitting: Medical Oncology

## 2020-09-03 DIAGNOSIS — I878 Other specified disorders of veins: Secondary | ICD-10-CM

## 2020-09-03 NOTE — Telephone Encounter (Signed)
Medication problem Per Dr Julien Nordmann he discontinued the Remeron when he saw pt was on Elavil. I called wife and she said pt will stay on elavil and she will return mirtazepine to pharmacy.  Poor venous access- Pt would like a port a cath and this is okay per Dr Julien Nordmann. Order placed.

## 2020-09-03 NOTE — Progress Notes (Signed)
Pharmacist Chemotherapy Monitoring - Initial Assessment    Anticipated start date: 09/09/20   Regimen:  . Are orders appropriate based on the patient's diagnosis, regimen, and cycle? Yes . Does the plan date match the patient's scheduled date? Yes . Is the sequencing of drugs appropriate? Yes . Are the premedications appropriate for the patient's regimen? Yes . Prior Authorization for treatment is: Pending o If applicable, is the correct biosimilar selected based on the patient's insurance? not applicable  Organ Function and Labs: Marland Kitchen Are dose adjustments needed based on the patient's renal function, hepatic function, or hematologic function? No . Are appropriate labs ordered prior to the start of patient's treatment? Yes . Other organ system assessment, if indicated: N/A . The following baseline labs, if indicated, have been ordered: pembrolizumab: baseline TSH +/- T4  Dose Assessment: . Are the drug doses appropriate? Yes . Are the following correct: o Drug concentrations Yes o IV fluid compatible with drug Yes o Administration routes Yes o Timing of therapy Yes . If applicable, does the patient have documented access for treatment and/or plans for port-a-cath placement? not applicable . If applicable, have lifetime cumulative doses been properly documented and assessed? not applicable Lifetime Dose Tracking  No doses have been documented on this patient for the following tracked chemicals: Doxorubicin, Epirubicin, Idarubicin, Daunorubicin, Mitoxantrone, Bleomycin, Oxaliplatin, Carboplatin, Liposomal Doxorubicin  o   Toxicity Monitoring/Prevention: . The patient has the following take home antiemetics prescribed: N/A . The patient has the following take home medications prescribed: B12 for pemetrexed and folic acid for pemetrexed . Medication allergies and previous infusion related reactions, if applicable, have been reviewed and addressed. Yes . The patient's current medication  list has been assessed for drug-drug interactions with their chemotherapy regimen. no significant drug-drug interactions were identified on review.  Order Review: . Are the treatment plan orders signed? Yes . Is the patient scheduled to see a provider prior to their treatment? Yes  I verify that I have reviewed each item in the above checklist and answered each question accordingly.  Randall Wilkins K 09/03/2020 1:37 PM

## 2020-09-04 ENCOUNTER — Other Ambulatory Visit: Payer: Self-pay

## 2020-09-04 ENCOUNTER — Encounter: Payer: Self-pay | Admitting: Emergency Medicine

## 2020-09-04 ENCOUNTER — Ambulatory Visit: Payer: Medicare Other | Admitting: Emergency Medicine

## 2020-09-04 DIAGNOSIS — C3491 Malignant neoplasm of unspecified part of right bronchus or lung: Secondary | ICD-10-CM | POA: Diagnosis not present

## 2020-09-04 DIAGNOSIS — R0600 Dyspnea, unspecified: Secondary | ICD-10-CM | POA: Diagnosis not present

## 2020-09-04 DIAGNOSIS — G4733 Obstructive sleep apnea (adult) (pediatric): Secondary | ICD-10-CM | POA: Diagnosis not present

## 2020-09-04 DIAGNOSIS — R0609 Other forms of dyspnea: Secondary | ICD-10-CM

## 2020-09-04 MED ORDER — ALBUTEROL SULFATE HFA 108 (90 BASE) MCG/ACT IN AERS: 2.0000 | INHALATION_SPRAY | Freq: Four times a day (QID) | RESPIRATORY_TRACT | 6 refills | Status: DC | PRN

## 2020-09-04 NOTE — Progress Notes (Signed)
Subjective:    Patient ID: Randall Wilkins, male    DOB: 1940/12/16, 79 y.o.   MRN: [id number]  HPI 79 year old former smoker (40 pack years), with diabetes, hypertension, hyperlipidemia, aortic stenosis, CAD/CABG, CVA, A fib, OSA on CPAP. He is referred today for evaluation of dyspnea on exertion, cough, abnormal chest imaging  He reports that has been dealing with cough, intermittently productive of mucus since the beginning of 2021.  Has been treated with Mucinex, antibiotics in January.  He had a chest x-ray 03/08/2020 that reportedly showed bilateral pulmonary infiltrates.  He was treated with antibiotics for bacterial pneumonia, slow to to respond and was treated with serial courses - poorly tolerated levaquin. His CT scan of the chest 03/27/2020 was performed, showed dense right upper lobe and right lower lobe, left upper lobe infiltrates with a small associated right pleural effusion, possible left infrahilar lymph node.  He had a repeat chest x-ray done 04/29/2020 which I have reviewed, showed persistent bilateral pulmonary infiltrates.   No hx of cancer, had an uncle w lung CA.   He is followed by Dr Debara Pickett  ROV 07/22/20 --79 year old former smoker with diabetes, hypertension, hyperlipidemia, left ear, CAD/CABG, OSA on CPAP.  I saw him last month for exertional dyspnea, cough, pulmonary infiltrates on chest imaging following pneumonia in March and April of this year.  Bilateral rounded opacities.  We repeated a CT scan of the chest 06/23/2020 and I have reviewed.  This shows right lower lobe rounded opacity now with some cavitation, persistent large left upper lobe rounded opacity probable mass and interval enlargement of mediastinal hilar lymphadenopathy.  All very concerning for malignancy. He has been sleeping more. His exertional SOB persists, able to walk only 25-50 feet. His wife reports a mold exposure in the home - discovered in April.   ROV 09/04/20 --follow-up visit for 79 year old  former smoker with multiple medical problems include diabetes, hypertension, hyperlipidemia, CAD/CABG, OSA on CPAP.  He underwent bronchoscopy on 08/06/2020 for bilateral rounded opacities and mediastinal lymphadenopathy.  This showed mucinous adenocarcinoma.  He is planning to start systemic chemotherapy and Keytruda on 9/27.  His breathing is doing ok. He is able to get around some, walk through through the house. No cough or wheeze at this time. His biggest barrier to mobility is his legs and imbalance, falls.   Review of Systems As per HPI      Objective:   Physical Exam Vitals:   09/04/20 1041  BP: 112/66  Pulse: 60  Temp: 98.1 F (36.7 C)  TempSrc: Oral  SpO2: 97%  Weight: 239 lb (108.4 kg)  Height: 5\' 7"  (1.702 m)   Gen: Pleasant, obese, in no distress,  normal affect  ENT: No lesions,  mouth clear,  oropharynx clear, no postnasal drip  Neck: No JVD, no stridor  Lungs: No use of accessory muscles, clear, few B exp wheezes on a forced exp  Cardiovascular: RRR, heart sounds normal, no murmur or gallops, no peripheral edema  Musculoskeletal: No deformities, brace on his LLE  Neuro: alert, awake, non focal  Skin: Warm, no lesions or rash       Assessment & Plan:  Adenocarcinoma of right lung, stage 4 (HCC) Planning to start chemotherapy followed by [facility]. Following with Dr. Julien Nordmann. Interval imaging as per oncology plans.  Obstructive sleep apnea Continue CPAP nightly  Dyspnea on exertion  Multifactorial but suspect he does have at least some COPD based on his tobacco history. Not significantly limited. Mobility is  more limited by his legs, balance. I will give him an albuterol to use as needed, defer schedule bronchodilator at this time  Baltazar Apo, MD, PhD 09/04/2020, 11:05 AM [facility] [phone] or if no answer [phone]

## 2020-09-04 NOTE — Addendum Note (Signed)
Addended by: Gavin Potters R on: 09/04/2020 12:27 PM   Modules accepted: Orders

## 2020-09-04 NOTE — Assessment & Plan Note (Signed)
Continue CPAP nightly. °

## 2020-09-04 NOTE — Patient Instructions (Signed)
Follow-up with Dr. Julien Nordmann as planned We will prescribe an albuterol inhaler for you to keep available in case you develop shortness of breath. You could also consider using 2 puffs prior to exertion to see if this helps your breathing. Follow with Dr Lamonte Sakai in 6 months or sooner if you have any problems

## 2020-09-04 NOTE — Assessment & Plan Note (Signed)
Planning to start chemotherapy followed by [facility]. Following with Dr. Julien Nordmann. Interval imaging as per oncology plans.

## 2020-09-04 NOTE — Assessment & Plan Note (Signed)
Multifactorial but suspect he does have at least some COPD based on his tobacco history. Not significantly limited. Mobility is more limited by his legs, balance. I will give him an albuterol to use as needed, defer schedule bronchodilator at this time

## 2020-09-06 ENCOUNTER — Other Ambulatory Visit: Payer: Self-pay

## 2020-09-06 ENCOUNTER — Inpatient Hospital Stay: Payer: Medicare Other

## 2020-09-07 DIAGNOSIS — Z23 Encounter for immunization: Secondary | ICD-10-CM | POA: Diagnosis not present

## 2020-09-09 ENCOUNTER — Inpatient Hospital Stay: Payer: Medicare Other

## 2020-09-09 ENCOUNTER — Other Ambulatory Visit: Payer: Self-pay

## 2020-09-09 VITALS — BP 127/67 | HR 62 | Temp 98.0°F | Resp 18

## 2020-09-09 DIAGNOSIS — C3491 Malignant neoplasm of unspecified part of right bronchus or lung: Secondary | ICD-10-CM

## 2020-09-09 DIAGNOSIS — Z5112 Encounter for antineoplastic immunotherapy: Secondary | ICD-10-CM | POA: Diagnosis not present

## 2020-09-09 DIAGNOSIS — C3412 Malignant neoplasm of upper lobe, left bronchus or lung: Secondary | ICD-10-CM | POA: Diagnosis not present

## 2020-09-09 DIAGNOSIS — Z79899 Other long term (current) drug therapy: Secondary | ICD-10-CM | POA: Diagnosis not present

## 2020-09-09 DIAGNOSIS — Z5111 Encounter for antineoplastic chemotherapy: Secondary | ICD-10-CM | POA: Diagnosis not present

## 2020-09-09 LAB — CBC WITH DIFFERENTIAL (CANCER CENTER ONLY)
Abs Immature Granulocytes: 0.02 10*3/uL (ref 0.00–0.07)
Basophils Absolute: 0.1 10*3/uL (ref 0.0–0.1)
Basophils Relative: 1 %
Eosinophils Absolute: 0.2 10*3/uL (ref 0.0–0.5)
Eosinophils Relative: 2 %
HCT: 44.3 % (ref 39.0–52.0)
Hemoglobin: 14.2 g/dL (ref 13.0–17.0)
Immature Granulocytes: 0 %
Lymphocytes Relative: 21 %
Lymphs Abs: 1.6 10*3/uL (ref 0.7–4.0)
MCH: 29.6 pg (ref 26.0–34.0)
MCHC: 32.1 g/dL (ref 30.0–36.0)
MCV: 92.5 fL (ref 80.0–100.0)
Monocytes Absolute: 0.9 10*3/uL (ref 0.1–1.0)
Monocytes Relative: 11 %
Neutro Abs: 4.8 10*3/uL (ref 1.7–7.7)
Neutrophils Relative %: 65 %
Platelet Count: 122 10*3/uL — ABNORMAL LOW (ref 150–400)
RBC: 4.79 MIL/uL (ref 4.22–5.81)
RDW: 15.1 % (ref 11.5–15.5)
WBC Count: 7.6 10*3/uL (ref 4.0–10.5)
nRBC: 0 % (ref 0.0–0.2)

## 2020-09-09 LAB — CMP (CANCER CENTER ONLY)
ALT: 12 U/L (ref 0–44)
AST: 13 U/L — ABNORMAL LOW (ref 15–41)
Albumin: 3 g/dL — ABNORMAL LOW (ref 3.5–5.0)
Alkaline Phosphatase: 74 U/L (ref 38–126)
Anion gap: 8 (ref 5–15)
BUN: 16 mg/dL (ref 8–23)
CO2: 29 mmol/L (ref 22–32)
Calcium: 9 mg/dL (ref 8.9–10.3)
Chloride: 105 mmol/L (ref 98–111)
Creatinine: 1.34 mg/dL — ABNORMAL HIGH (ref 0.61–1.24)
GFR, Est AFR Am: 58 mL/min — ABNORMAL LOW (ref >60)
GFR, Estimated: 50 mL/min — ABNORMAL LOW (ref >60)
Glucose, Bld: 146 mg/dL — ABNORMAL HIGH (ref 70–99)
Potassium: 4.2 mmol/L (ref 3.5–5.1)
Sodium: 142 mmol/L (ref 135–145)
Total Bilirubin: 0.6 mg/dL (ref 0.3–1.2)
Total Protein: 6.2 g/dL — ABNORMAL LOW (ref 6.5–8.1)

## 2020-09-09 LAB — TSH: TSH: 0.973 u[IU]/mL (ref 0.320–4.118)

## 2020-09-09 MED ORDER — SODIUM CHLORIDE 0.9 % IV SOLN
150.0000 mg | Freq: Once | INTRAVENOUS | Status: AC
  Administered 2020-09-09: 150 mg via INTRAVENOUS
  Filled 2020-09-09: qty 150

## 2020-09-09 MED ORDER — SODIUM CHLORIDE 0.9 % IV SOLN
452.5000 mg | Freq: Once | INTRAVENOUS | Status: AC
  Administered 2020-09-09: 450 mg via INTRAVENOUS
  Filled 2020-09-09: qty 45

## 2020-09-09 MED ORDER — PALONOSETRON HCL INJECTION 0.25 MG/5ML
0.2500 mg | Freq: Once | INTRAVENOUS | Status: AC
  Administered 2020-09-09: 0.25 mg via INTRAVENOUS

## 2020-09-09 MED ORDER — SODIUM CHLORIDE 0.9 % IV SOLN
500.0000 mg/m2 | Freq: Once | INTRAVENOUS | Status: AC
  Administered 2020-09-09: 1100 mg via INTRAVENOUS
  Filled 2020-09-09: qty 40

## 2020-09-09 MED ORDER — SODIUM CHLORIDE 0.9 % IV SOLN
200.0000 mg | Freq: Once | INTRAVENOUS | Status: AC
  Administered 2020-09-09: 200 mg via INTRAVENOUS
  Filled 2020-09-09: qty 8

## 2020-09-09 MED ORDER — SODIUM CHLORIDE 0.9 % IV SOLN
Freq: Once | INTRAVENOUS | Status: AC
  Filled 2020-09-09: qty 250

## 2020-09-09 MED ORDER — PALONOSETRON HCL INJECTION 0.25 MG/5ML
INTRAVENOUS | Status: AC
  Filled 2020-09-09: qty 5

## 2020-09-09 MED ORDER — SODIUM CHLORIDE 0.9 % IV SOLN
10.0000 mg | Freq: Once | INTRAVENOUS | Status: AC
  Administered 2020-09-09: 10 mg via INTRAVENOUS
  Filled 2020-09-09: qty 10

## 2020-09-09 NOTE — Patient Instructions (Signed)
[facility] Discharge Instructions for Patients Receiving Chemotherapy  Today you received the following chemotherapy agents: pembrolizumab/pemetrexed/carboplatin.   To help prevent nausea and vomiting after your treatment, we encourage you to take your nausea medication as directed.   If you develop nausea and vomiting that is not controlled by your nausea medication, call the clinic.   BELOW ARE SYMPTOMS THAT SHOULD BE REPORTED IMMEDIATELY:  *FEVER GREATER THAN 100.5 F  *CHILLS WITH OR WITHOUT FEVER  NAUSEA AND VOMITING THAT IS NOT CONTROLLED WITH YOUR NAUSEA MEDICATION  *UNUSUAL SHORTNESS OF BREATH  *UNUSUAL BRUISING OR BLEEDING  TENDERNESS IN MOUTH AND THROAT WITH OR WITHOUT PRESENCE OF ULCERS  *URINARY PROBLEMS  *BOWEL PROBLEMS  UNUSUAL RASH Items with * indicate a potential emergency and should be followed up as soon as possible.  Feel free to call the clinic should you have any questions or concerns. The clinic phone number is [phone].  Please show the [facility] at check-in to the Emergency Department and triage nurse.  Pembrolizumab injection What is this medicine? PEMBROLIZUMAB (pem broe liz ue mab) is a monoclonal antibody. It is used to treat certain types of cancer. This medicine may be used for other purposes; ask your health care provider or pharmacist if you have questions. COMMON BRAND NAME(S): Keytruda What should I tell my health care provider before I take this medicine? They need to know if you have any of these conditions:  diabetes  immune system problems  inflammatory bowel disease  liver disease  lung or breathing disease  lupus  received or scheduled to receive an organ transplant or a stem-cell transplant that uses donor stem cells  an unusual or allergic reaction to pembrolizumab, other medicines, foods, dyes, or preservatives  pregnant or trying to get pregnant  breast-feeding How should I use this  medicine? This medicine is for infusion into a vein. It is given by a health care professional in a hospital or clinic setting. A special MedGuide will be given to you before each treatment. Be sure to read this information carefully each time. Talk to your pediatrician regarding the use of this medicine in children. While this drug may be prescribed for children as young as 6 months for selected conditions, precautions do apply. Overdosage: If you think you have taken too much of this medicine contact a poison control center or emergency room at once. NOTE: This medicine is only for you. Do not share this medicine with others. What if I miss a dose? It is important not to miss your dose. Call your doctor or health care professional if you are unable to keep an appointment. What may interact with this medicine? Interactions have not been studied. Give your health care provider a list of all the medicines, herbs, non-prescription drugs, or dietary supplements you use. Also tell them if you smoke, drink alcohol, or use illegal drugs. Some items may interact with your medicine. This list may not describe all possible interactions. Give your health care provider a list of all the medicines, herbs, non-prescription drugs, or dietary supplements you use. Also tell them if you smoke, drink alcohol, or use illegal drugs. Some items may interact with your medicine. What should I watch for while using this medicine? Your condition will be monitored carefully while you are receiving this medicine. You may need blood work done while you are taking this medicine. Do not become pregnant while taking this medicine or for 4 months after stopping it. Women  should inform their doctor if they wish to become pregnant or think they might be pregnant. There is a potential for serious side effects to an unborn child. Talk to your health care professional or pharmacist for more information. Do not breast-feed an infant while  taking this medicine or for 4 months after the last dose. What side effects may I notice from receiving this medicine? Side effects that you should report to your doctor or health care professional as soon as possible:  allergic reactions like skin rash, itching or hives, swelling of the face, lips, or tongue  bloody or black, tarry  breathing problems  changes in vision  chest pain  chills  confusion  constipation  cough  diarrhea  dizziness or feeling faint or lightheaded  fast or irregular heartbeat  fever  flushing  joint pain  low blood counts - this medicine may decrease the number of white blood cells, red blood cells and platelets. You may be at increased risk for infections and bleeding.  muscle pain  muscle weakness  pain, tingling, numbness in the hands or feet  persistent headache  redness, blistering, peeling or loosening of the skin, including inside the mouth  signs and symptoms of high blood sugar such as dizziness; dry mouth; dry skin; fruity breath; nausea; stomach pain; increased hunger or thirst; increased urination  signs and symptoms of kidney injury like trouble passing urine or change in the amount of urine  signs and symptoms of liver injury like dark urine, light-colored stools, loss of appetite, nausea, right upper belly pain, yellowing of the eyes or skin  sweating  swollen lymph nodes  weight loss Side effects that usually do not require medical attention (report to your doctor or health care professional if they continue or are bothersome):  decreased appetite  hair loss  muscle pain  tiredness This list may not describe all possible side effects. Call your doctor for medical advice about side effects. You may report side effects to FDA at 1-800-FDA-1088. Where should I keep my medicine? This drug is given in a hospital or clinic and will not be stored at home. NOTE: This sheet is a summary. It may not cover all  possible information. If you have questions about this medicine, talk to your doctor, pharmacist, or health care provider.  2020 Elsevier/Gold Standard (2019-10-06 18:07:58)  Pemetrexed injection What is this medicine? PEMETREXED (PEM e TREX ed) is a chemotherapy drug used to treat lung cancers like non-small cell lung cancer and mesothelioma. It may also be used to treat other cancers. This medicine may be used for other purposes; ask your health care provider or pharmacist if you have questions. COMMON BRAND NAME(S): Alimta What should I tell my health care provider before I take this medicine? They need to know if you have any of these conditions:  infection (especially a virus infection such as chickenpox, cold sores, or herpes)  kidney disease  low blood counts, like low white cell, platelet, or red cell counts  lung or breathing disease, like asthma  radiation therapy  an unusual or allergic reaction to pemetrexed, other medicines, foods, dyes, or preservative  pregnant or trying to get pregnant  breast-feeding How should I use this medicine? This drug is given as an infusion into a vein. It is administered in a hospital or clinic by a specially trained health care professional. Talk to your pediatrician regarding the use of this medicine in children. Special care may be needed. Overdosage: If you  think you have taken too much of this medicine contact a poison control center or emergency room at once. NOTE: This medicine is only for you. Do not share this medicine with others. What if I miss a dose? It is important not to miss your dose. Call your doctor or health care professional if you are unable to keep an appointment. What may interact with this medicine? This medicine may interact with the following medications:  Ibuprofen This list may not describe all possible interactions. Give your health care provider a list of all the medicines, herbs, non-prescription drugs,  or dietary supplements you use. Also tell them if you smoke, drink alcohol, or use illegal drugs. Some items may interact with your medicine. What should I watch for while using this medicine? Visit your doctor for checks on your progress. This drug may make you feel generally unwell. This is not uncommon, as chemotherapy can affect healthy cells as well as cancer cells. Report any side effects. Continue your course of treatment even though you feel ill unless your doctor tells you to stop. In some cases, you may be given additional medicines to help with side effects. Follow all directions for their use. Call your doctor or health care professional for advice if you get a fever, chills or sore throat, or other symptoms of a cold or flu. Do not treat yourself. This drug decreases your body's ability to fight infections. Try to avoid being around people who are sick. This medicine may increase your risk to bruise or bleed. Call your doctor or health care professional if you notice any unusual bleeding. Be careful brushing and flossing your teeth or using a toothpick because you may get an infection or bleed more easily. If you have any dental work done, tell your dentist you are receiving this medicine. Avoid taking products that contain aspirin, acetaminophen, ibuprofen, naproxen, or ketoprofen unless instructed by your doctor. These medicines may hide a fever. Call your doctor or health care professional if you get diarrhea or mouth sores. Do not treat yourself. To protect your kidneys, drink water or other fluids as directed while you are taking this medicine. Do not become pregnant while taking this medicine or for 6 months after stopping it. Women should inform their doctor if they wish to become pregnant or think they might be pregnant. Men should not father a child while taking this medicine and for 3 months after stopping it. This may interfere with the ability to father a child. You should talk to  your doctor or health care professional if you are concerned about your fertility. There is a potential for serious side effects to an unborn child. Talk to your health care professional or pharmacist for more information. Do not breast-feed an infant while taking this medicine or for 1 week after stopping it. What side effects may I notice from receiving this medicine? Side effects that you should report to your doctor or health care professional as soon as possible:  allergic reactions like skin rash, itching or hives, swelling of the face, lips, or tongue  breathing problems  redness, blistering, peeling or loosening of the skin, including inside the mouth  signs and symptoms of bleeding such as bloody or black, tarry stools; red or dark-brown urine; spitting up blood or brown material that looks like coffee grounds; red spots on the skin; unusual bruising or bleeding from the eye, gums, or nose  signs and symptoms of infection like fever or chills; cough; sore  throat; pain or trouble passing urine  signs and symptoms of kidney injury like trouble passing urine or change in the amount of urine  signs and symptoms of liver injury like dark yellow or brown urine; general ill feeling or flu-like symptoms; light-colored stools; loss of appetite; nausea; right upper belly pain; unusually weak or tired; yellowing of the eyes or skin Side effects that usually do not require medical attention (report to your doctor or health care professional if they continue or are bothersome):  constipation  mouth sores  nausea, vomiting  unusually weak or tired This list may not describe all possible side effects. Call your doctor for medical advice about side effects. You may report side effects to FDA at 1-800-FDA-1088. Where should I keep my medicine? This drug is given in a hospital or clinic and will not be stored at home. NOTE: This sheet is a summary. It may not cover all possible information. If  you have questions about this medicine, talk to your doctor, pharmacist, or health care provider.  2020 Elsevier/Gold Standard (2018-01-19 16:11:33)  Carboplatin injection What is this medicine? CARBOPLATIN (KAR boe pla tin) is a chemotherapy drug. It targets fast dividing cells, like cancer cells, and causes these cells to die. This medicine is used to treat ovarian cancer and many other cancers. This medicine may be used for other purposes; ask your health care provider or pharmacist if you have questions. COMMON BRAND NAME(S): Paraplatin What should I tell my health care provider before I take this medicine? They need to know if you have any of these conditions:  blood disorders  hearing problems  kidney disease  recent or ongoing radiation therapy  an unusual or allergic reaction to carboplatin, cisplatin, other chemotherapy, other medicines, foods, dyes, or preservatives  pregnant or trying to get pregnant  breast-feeding How should I use this medicine? This drug is usually given as an infusion into a vein. It is administered in a hospital or clinic by a specially trained health care professional. Talk to your pediatrician regarding the use of this medicine in children. Special care may be needed. Overdosage: If you think you have taken too much of this medicine contact a poison control center or emergency room at once. NOTE: This medicine is only for you. Do not share this medicine with others. What if I miss a dose? It is important not to miss a dose. Call your doctor or health care professional if you are unable to keep an appointment. What may interact with this medicine?  medicines for seizures  medicines to increase blood counts like filgrastim, pegfilgrastim, sargramostim  some antibiotics like amikacin, gentamicin, neomycin, streptomycin, tobramycin  vaccines Talk to your doctor or health care professional before taking any of these  medicines:  acetaminophen  aspirin  ibuprofen  ketoprofen  naproxen This list may not describe all possible interactions. Give your health care provider a list of all the medicines, herbs, non-prescription drugs, or dietary supplements you use. Also tell them if you smoke, drink alcohol, or use illegal drugs. Some items may interact with your medicine. What should I watch for while using this medicine? Your condition will be monitored carefully while you are receiving this medicine. You will need important blood work done while you are taking this medicine. This drug may make you feel generally unwell. This is not uncommon, as chemotherapy can affect healthy cells as well as cancer cells. Report any side effects. Continue your course of treatment even though you feel  ill unless your doctor tells you to stop. In some cases, you may be given additional medicines to help with side effects. Follow all directions for their use. Call your doctor or health care professional for advice if you get a fever, chills or sore throat, or other symptoms of a cold or flu. Do not treat yourself. This drug decreases your body's ability to fight infections. Try to avoid being around people who are sick. This medicine may increase your risk to bruise or bleed. Call your doctor or health care professional if you notice any unusual bleeding. Be careful brushing and flossing your teeth or using a toothpick because you may get an infection or bleed more easily. If you have any dental work done, tell your dentist you are receiving this medicine. Avoid taking products that contain aspirin, acetaminophen, ibuprofen, naproxen, or ketoprofen unless instructed by your doctor. These medicines may hide a fever. Do not become pregnant while taking this medicine. Women should inform their doctor if they wish to become pregnant or think they might be pregnant. There is a potential for serious side effects to an unborn child. Talk  to your health care professional or pharmacist for more information. Do not breast-feed an infant while taking this medicine. What side effects may I notice from receiving this medicine? Side effects that you should report to your doctor or health care professional as soon as possible:  allergic reactions like skin rash, itching or hives, swelling of the face, lips, or tongue  signs of infection - fever or chills, cough, sore throat, pain or difficulty passing urine  signs of decreased platelets or bleeding - bruising, pinpoint red spots on the skin, black, tarry stools, nosebleeds  signs of decreased red blood cells - unusually weak or tired, fainting spells, lightheadedness  breathing problems  changes in hearing  changes in vision  chest pain  high blood pressure  low blood counts - This drug may decrease the number of white blood cells, red blood cells and platelets. You may be at increased risk for infections and bleeding.  nausea and vomiting  pain, swelling, redness or irritation at the injection site  pain, tingling, numbness in the hands or feet  problems with balance, talking, walking  trouble passing urine or change in the amount of urine Side effects that usually do not require medical attention (report to your doctor or health care professional if they continue or are bothersome):  hair loss  loss of appetite  metallic taste in the mouth or changes in taste This list may not describe all possible side effects. Call your doctor for medical advice about side effects. You may report side effects to FDA at 1-800-FDA-1088. Where should I keep my medicine? This drug is given in a hospital or clinic and will not be stored at home. NOTE: This sheet is a summary. It may not cover all possible information. If you have questions about this medicine, talk to your doctor, pharmacist, or health care provider.  2020 Elsevier/Gold Standard (2008-03-06 14:38:05)

## 2020-09-10 ENCOUNTER — Telehealth: Payer: Self-pay | Admitting: *Deleted

## 2020-09-16 ENCOUNTER — Encounter: Payer: Self-pay | Admitting: Internal Medicine

## 2020-09-16 ENCOUNTER — Encounter: Payer: Self-pay | Admitting: *Deleted

## 2020-09-16 ENCOUNTER — Inpatient Hospital Stay: Payer: Medicare Other

## 2020-09-16 ENCOUNTER — Inpatient Hospital Stay: Payer: Medicare Other | Attending: Internal Medicine | Admitting: Internal Medicine

## 2020-09-16 ENCOUNTER — Other Ambulatory Visit: Payer: Self-pay

## 2020-09-16 VITALS — BP 149/75 | HR 70 | Temp 100.1°F | Resp 20 | Ht 67.0 in | Wt 240.0 lb

## 2020-09-16 DIAGNOSIS — Z79899 Other long term (current) drug therapy: Secondary | ICD-10-CM | POA: Insufficient documentation

## 2020-09-16 DIAGNOSIS — C3491 Malignant neoplasm of unspecified part of right bronchus or lung: Secondary | ICD-10-CM | POA: Diagnosis not present

## 2020-09-16 DIAGNOSIS — Z5189 Encounter for other specified aftercare: Secondary | ICD-10-CM | POA: Insufficient documentation

## 2020-09-16 DIAGNOSIS — Z5112 Encounter for antineoplastic immunotherapy: Secondary | ICD-10-CM | POA: Insufficient documentation

## 2020-09-16 DIAGNOSIS — C3412 Malignant neoplasm of upper lobe, left bronchus or lung: Secondary | ICD-10-CM | POA: Insufficient documentation

## 2020-09-16 DIAGNOSIS — I10 Essential (primary) hypertension: Secondary | ICD-10-CM

## 2020-09-16 DIAGNOSIS — C3431 Malignant neoplasm of lower lobe, right bronchus or lung: Secondary | ICD-10-CM | POA: Insufficient documentation

## 2020-09-16 DIAGNOSIS — Z5111 Encounter for antineoplastic chemotherapy: Secondary | ICD-10-CM | POA: Diagnosis not present

## 2020-09-16 LAB — CBC WITH DIFFERENTIAL (CANCER CENTER ONLY)
Abs Immature Granulocytes: 0.02 10*3/uL (ref 0.00–0.07)
Basophils Absolute: 0 10*3/uL (ref 0.0–0.1)
Basophils Relative: 1 %
Eosinophils Absolute: 0.2 10*3/uL (ref 0.0–0.5)
Eosinophils Relative: 4 %
HCT: 44.1 % (ref 39.0–52.0)
Hemoglobin: 14.1 g/dL (ref 13.0–17.0)
Immature Granulocytes: 0 %
Lymphocytes Relative: 26 %
Lymphs Abs: 1.2 10*3/uL (ref 0.7–4.0)
MCH: 28.9 pg (ref 26.0–34.0)
MCHC: 32 g/dL (ref 30.0–36.0)
MCV: 90.4 fL (ref 80.0–100.0)
Monocytes Absolute: 0.1 10*3/uL (ref 0.1–1.0)
Monocytes Relative: 2 %
Neutro Abs: 3.1 10*3/uL (ref 1.7–7.7)
Neutrophils Relative %: 67 %
Platelet Count: 75 10*3/uL — ABNORMAL LOW (ref 150–400)
RBC: 4.88 MIL/uL (ref 4.22–5.81)
RDW: 15 % (ref 11.5–15.5)
WBC Count: 4.7 10*3/uL (ref 4.0–10.5)
nRBC: 0 % (ref 0.0–0.2)

## 2020-09-16 LAB — CMP (CANCER CENTER ONLY)
ALT: 12 U/L (ref 0–44)
AST: 14 U/L — ABNORMAL LOW (ref 15–41)
Albumin: 2.7 g/dL — ABNORMAL LOW (ref 3.5–5.0)
Alkaline Phosphatase: 63 U/L (ref 38–126)
Anion gap: 6 (ref 5–15)
BUN: 28 mg/dL — ABNORMAL HIGH (ref 8–23)
CO2: 28 mmol/L (ref 22–32)
Calcium: 8.8 mg/dL — ABNORMAL LOW (ref 8.9–10.3)
Chloride: 106 mmol/L (ref 98–111)
Creatinine: 1.19 mg/dL (ref 0.61–1.24)
GFR, Est AFR Am: >60 mL/min (ref >60)
GFR, Estimated: 58 mL/min — ABNORMAL LOW (ref >60)
Glucose, Bld: 118 mg/dL — ABNORMAL HIGH (ref 70–99)
Potassium: 4.8 mmol/L (ref 3.5–5.1)
Sodium: 140 mmol/L (ref 135–145)
Total Bilirubin: 0.7 mg/dL (ref 0.3–1.2)
Total Protein: 5.9 g/dL — ABNORMAL LOW (ref 6.5–8.1)

## 2020-09-16 NOTE — Progress Notes (Signed)
Oncology Nurse Navigator Documentation  Oncology Nurse Navigator Flowsheets 09/16/2020  Abnormal Finding Date 03/27/2020  Confirmed Diagnosis Date 06/21/2020  Diagnosis Status Confirmed Diagnosis Complete  Planned Course of Treatment Chemotherapy;Targeted Therapy  Phase of Treatment Targeted Therapy  Chemotherapy Actual Start Date: 09/09/2020  Targeted Therapy Actual Start Date: 09/09/2020  Navigator Follow Up Date: 09/30/2020  Navigator Follow Up Reason: Follow-up Appointment  Navigator Location [facility]  Navigator Encounter Type Follow-up Appt  Treatment Initiated Date 09/09/2020  Patient Visit Type MedOnc  Treatment Phase Treatment  Barriers/Navigation Needs Education  Education Other  Interventions Education;Psycho-Social Support  Acuity Level 2-Minimal Needs (1-2 Barriers Identified)  Education Method Verbal  Time Spent with Patient 15

## 2020-09-16 NOTE — Progress Notes (Signed)
I reached out to [place] regarding Randall Wilkins.  He has advance cancer and is receiving treatment.  I do not identify any barriers to care right now but due to he and his wife's age, I notified CSW.

## 2020-09-16 NOTE — Progress Notes (Signed)
Met with patient and accompanying adult at registration to introduce myself as Financial Resource Specialist and to offer available resources.  Discussed one-time $1000 [employer] grant and qualifications to assist with personal expenses while going through treatment.  Gave them my card if interested in applying and for any additional financial questions or concerns.   

## 2020-09-16 NOTE — Progress Notes (Signed)
[place] [phone]   Fax:([phone]) [phone]  OFFICE PROGRESS NOTE  [name], MD [address] [place] [address]  DIAGNOSIS: Stage IV (T4, N2, M1 a) non-small cell lung cancer, mucinous adenocarcinoma presented with bilateral large masses in the left upper lobe as well as the right lower lobe in addition to other small nodules in the right upper lobe with hilar and mediastinal lymphadenopathy diagnosed in August 2021.  Biomarker Findings Microsatellite status - MS-Stable Tumor Mutational Burden - 4 Muts/Mb Genomic Findings For a complete list of the genes assayed, please refer to the Appendix. KRAS G12C GNAS R201H 7 Disease relevant genes with no reportable alterations: ALK, BRAF, EGFR, ERBB2, MET, RET, ROS1  PDL1 Expression: 20%.  PRIOR THERAPY: None  CURRENT THERAPY: Systemic chemotherapy with carboplatin for AUC of 5, Alimta 500 mg/M2 and Keytruda 200 mg IV every 3 weeks.  First dose 09/09/2020.  Status post 1 cycle.   INTERVAL HISTORY: Randall Wilkins 79 y.o. male returns to the clinic today for follow-up visit accompanied by his wife.  The patient is feeling fine today with no concerning complaints except for mild fatigue and mild rash on the face.  He tolerated the first week of his treatment with carboplatin, Alimta and Keytruda fairly well.  He denied having any significant weight loss or night sweats.  He has no nausea, vomiting, diarrhea or constipation.  He has no headache or visual changes.  He is here today for evaluation and repeat blood work.  MEDICAL HISTORY: Past Medical History:  Diagnosis Date  . Aortic stenosis, mild 11/14/2013  . Aortic valve sclerosis 03/29/2015  . Bilateral leg edema 05/21/2014  . CAD (coronary artery disease)   . Chronic diastolic congestive heart failure ([place])   . Chronic kidney disease (CKD), stage III (moderate)   . Diabetes 1.5, managed as type 1 ([place]) 02/04/2013  . Diabetic  peripheral neuropathy associated with type 1 diabetes mellitus ([place]) 11/14/2013  . Dysphagia   . Dyspnea on exertion 03/21/2015  . Exogenous obesity   . Gout   . Heart attack ([place])   . History of nuclear stress test 07/2011   dipyridamole; fixed inferolateral defect, worse at stress than rest; no reversible ischemia; low risk scan   . Hyperlipidemia   . Hypertension   . Hypothyroidism   . Insulin dependent diabetes mellitus   . Left foot drop   . Left main coronary artery disease 03/28/2015  . Memory loss   . Obesity (BMI 30-39.9) 11/14/2013  . Obstructive sleep apnea 03/21/2015  . OSA on CPAP    uses a cpap  . Peptic ulcer with hemorrhage 03/28/2015  . Peripheral neuropathy   . Pneumonia   . Rhabdomyolysis   . 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 Stroke ([place])    L patietal with small scattered lacunar infarcts  . Thrombocytopenia ([place]) 03/21/2015  . Venous insufficiency   . Weakness generalized 03/21/2015    ALLERGIES:  is allergic to actos [pioglitazone], metformin and related, and niaspan [niacin er].  MEDICATIONS:  Current Outpatient Medications  Medication Sig Dispense Refill  . albuterol (VENTOLIN HFA) 108 (90 Base) MCG/ACT inhaler Inhale 2 puffs into the lungs every 6 (six) hours as needed for wheezing or shortness of breath. 8 g 6  . allopurinol (ZYLOPRIM) 300 MG tablet Take 300 mg by mouth daily.   2  .  amitriptyline (ELAVIL) 50 MG tablet Take 50 mg by mouth at bedtime.     Marland Kitchen apixaban (ELIQUIS) 5 MG TABS tablet Take 1 tablet (5 mg total) by mouth 2 (two) times daily. You may restart this medication on Thursday, 08/08/2020. 60 tablet 11  . aspirin 81 MG tablet Take 1 tablet (81 mg total) by mouth daily.    . Blood Glucose Monitoring Suppl (CONTOUR NEXT MONITOR) w/Device KIT 1 Device by Does not apply route 3 (three) times daily. Use to check blood sugars 3 times daily. Dx Code E13.9 1 kit 2  . colchicine 0.6 MG tablet Take 0.6 mg by mouth  daily as needed (gout flare).     . CONTOUR NEXT TEST test strip USE 1 STRIP TO CHECK GLUCOSE 4 TIMES DAILY 150 each 0  . donepezil (ARICEPT) 10 MG tablet TAKE ONE TABLET DAILY.  PLEASE CALL [phone] TO SCHEDULE FOLLOW UP. (Patient taking differently: Take 10 mg by mouth daily. ) 90 tablet 0  . folic acid (FOLVITE) 1 MG tablet Take 1 tablet (1 mg total) by mouth daily. 30 tablet 4  . furosemide (LASIX) 40 MG tablet Take 80 mg by mouth in the morning.     . insulin degludec (TRESIBA FLEXTOUCH) 200 UNIT/ML FlexTouch Pen Inject 46 Units into the skin at bedtime.    . insulin lispro (HUMALOG) 100 UNIT/ML cartridge Inject 5-15 Units into the skin 3 (three) times daily with meals.    . Insulin Pen Needle (EASY COMFORT PEN NEEDLES) 33G X 4 MM MISC 1 each by Does not apply route See admin instructions. Use to inject insulin 5 times daily. 150 each 3  . levothyroxine (SYNTHROID, LEVOTHROID) 75 MCG tablet Take 75 mcg by mouth daily before breakfast.   1  . liraglutide (VICTOZA) 18 MG/3ML SOPN Inject 1.2 mg into the skin daily.     Marland Kitchen lisinopril (ZESTRIL) 10 MG tablet TAKE ONE TABLET DAILY 90 tablet 2  . metFORMIN (GLUCOPHAGE-XR) 750 MG 24 hr tablet TAKE 1 TABLET WITH DINNER 90 tablet 0  . metoprolol succinate (TOPROL-XL) 25 MG 24 hr tablet Take 1 tablet (25 mg total) by mouth daily. 90 tablet 3  . NON FORMULARY Inhale 1 application into the lungs at bedtime. CPAP    . potassium chloride SA (K-DUR,KLOR-CON) 20 MEQ tablet Take 20 mEq by mouth 2 (two) times daily.     . prochlorperazine (COMPAZINE) 10 MG tablet Take 1 tablet (10 mg total) by mouth every 6 (six) hours as needed for nausea or vomiting. 30 tablet 0  . rosuvastatin (CRESTOR) 20 MG tablet Take 20 mg by mouth daily.    . Tamsulosin HCl (FLOMAX) 0.4 MG CAPS Take 0.4 mg by mouth daily.     Marland Kitchen torsemide (DEMADEX) 10 MG tablet Take 10 mg by mouth every evening.     . vitamin B-12 (CYANOCOBALAMIN) 1000 MCG tablet Take 1,000 mcg by mouth daily.     No  current facility-administered medications for this visit.    SURGICAL HISTORY:  Past Surgical History:  Procedure Laterality Date  . BACK SURGERY  2002   lumbosacral. 11 back surgeries total  . BRONCHIAL BIOPSY  08/06/2020   Procedure: BRONCHIAL BIOPSIES;  Surgeon: Collene Gobble, MD;  [facility];  Service: Pulmonary;;  . BRONCHIAL BRUSHINGS  08/06/2020   Procedure: BRONCHIAL BRUSHINGS;  Surgeon: Collene Gobble, MD;  Location: [facility] ENDOSCOPY;  Service: Pulmonary;;  . BRONCHIAL NEEDLE ASPIRATION BIOPSY  08/06/2020   Procedure: BRONCHIAL NEEDLE ASPIRATION  BIOPSIES;  Surgeon: Collene Gobble, MD;  Location: [facility] ENDOSCOPY;  Service: Pulmonary;;  . Carotid Doppler  03/2013   bilat bulb/prox ICAs - mild amount of fibrous plaque with no evidence of diameter reduction  . CARPAL TUNNEL RELEASE Bilateral 08/09/2014   Procedure: BILATERAL CARPAL TUNNEL RELEASE;  Surgeon: Daryll Brod, MD;  Location: [place];  Service: Orthopedics;  Laterality: Bilateral;  ANESTHESIA:  IV REGIONAL BIL FAB  . CHOLECYSTECTOMY    . COLONOSCOPY    . CORONARY ANGIOPLASTY  10/13/1996  . CORONARY ANGIOPLASTY  09/21/1989   emergency PTCA  . CORONARY ANGIOPLASTY  10/13/1996   Multi-Link diagonal & OD stenting (Dr. Marella Chimes)  . CORONARY ANGIOPLASTY  12/03/1997   disease of mid DX-1 ~50% & in mid PLA & PDA (distal lesions) (Dr. Marella Chimes)   . CORONARY ANGIOPLASTY  10/14/1999   progression of disease distal PLA & PDA; progression of disease prox RCA - moderate (Dr. Marella Chimes)   . CORONARY ANGIOPLASTY WITH STENT PLACEMENT  04/04/2004   4.0x54m non-DES (thrombectomy via AngioJet) to RCA for high grade stenosis (Dr. RMarella Chimes  . CORONARY ARTERY BYPASS GRAFT N/A 03/29/2015   Procedure: CORONARY ARTERY BYPASS GRAFTING TIMES TWO USING LEFT INTERNAL MAMMARY ARTERY AND RIGHT LEG GREATER SAPHENOUS VEIN HARVESTED ENDOSCOPICALLY.;  Surgeon: CRexene Alberts MD;  [place]  Service: Open Heart  Surgery;  Laterality: N/A;  . ESOPHAGOGASTRODUODENOSCOPY N/A 03/27/2015   Procedure: ESOPHAGOGASTRODUODENOSCOPY (EGD);  Surgeon: MClarene Essex MD;  [facility];  Service: Endoscopy;  Laterality: N/A;  possible dilation  . FINE NEEDLE ASPIRATION  08/06/2020   Procedure: FINE NEEDLE ASPIRATION (FNA) LINEAR;  Surgeon: BCollene Gobble MD;  [facility];  Service: Pulmonary;;  . LEFT HEART CATHETERIZATION WITH CORONARY ANGIOGRAM N/A 03/28/2015   Procedure: LEFT HEART CATHETERIZATION WITH CORONARY ANGIOGRAM;  Surgeon: Peter M JMartinique MD;  [facility];  Service: Cardiovascular;  Laterality: N/A;  . SINUS ENDO W/FUSION    . TONSILLECTOMY    . TRANSTHORACIC ECHOCARDIOGRAM  08/08/2013   EF 55-60%, mild conc hypertrophy, grade 1 diastolic dysfunction; AV with mild stenosis; LA & RA mildly dilated  . VIDEO BRONCHOSCOPY WITH ENDOBRONCHIAL NAVIGATION N/A 08/06/2020   Procedure: VIDEO BRONCHOSCOPY WITH ENDOBRONCHIAL NAVIGATION;  Surgeon: BCollene Gobble MD;  [facility];  Service: Pulmonary;  Laterality: N/A;  . VIDEO BRONCHOSCOPY WITH ENDOBRONCHIAL ULTRASOUND N/A 08/06/2020   Procedure: VIDEO BRONCHOSCOPY WITH ENDOBRONCHIAL ULTRASOUND;  Surgeon: BCollene Gobble MD;  [facility];  Service: Pulmonary;  Laterality: N/A;    REVIEW OF SYSTEMS:  A comprehensive review of systems was negative except for: Constitutional: positive for fatigue Integument/breast: positive for rash   PHYSICAL EXAMINATION: General appearance: alert, cooperative, fatigued and no distress Head: Normocephalic, without obvious abnormality, atraumatic Neck: no adenopathy, no JVD, supple, symmetrical, trachea midline and thyroid not enlarged, symmetric, no tenderness/mass/nodules Lymph nodes: Cervical, supraclavicular, and axillary nodes normal. Resp: clear to auscultation bilaterally Back: symmetric, no curvature. ROM normal. No CVA tenderness. Cardio: regular rate and rhythm, S1, S2 normal,  no murmur, click, rub or gallop GI: soft, non-tender; bowel sounds normal; no masses,  no organomegaly Extremities: extremities normal, atraumatic, no cyanosis or edema  ECOG PERFORMANCE STATUS: 1 - Symptomatic but completely ambulatory  Blood pressure (!) 149/75, pulse 70, temperature 100.1 F (37.8 C), temperature source Tympanic, resp. rate 20, height 5' 7" (1.702 m), weight 240 lb (108.9 kg), SpO2 98 %.  LABORATORY DATA: Lab Results  Component  Value Date   WBC 7.6 09/09/2020   HGB 14.2 09/09/2020   HCT 44.3 09/09/2020   MCV 92.5 09/09/2020   PLT 122 (L) 09/09/2020      Chemistry      Component Value Date/Time   NA 142 09/09/2020 0745   K 4.2 09/09/2020 0745   CL 105 09/09/2020 0745   CO2 29 09/09/2020 0745   BUN 16 09/09/2020 0745   CREATININE 1.34 (H) 09/09/2020 0745   CREATININE 1.30 (H) 04/30/2020 1044      Component Value Date/Time   CALCIUM 9.0 09/09/2020 0745   ALKPHOS 74 09/09/2020 0745   AST 13 (L) 09/09/2020 0745   ALT 12 09/09/2020 0745   BILITOT 0.6 09/09/2020 0745       RADIOGRAPHIC STUDIES: NM PET Image Initial (PI) Skull Base To Thigh  Result Date: 08/22/2020 CLINICAL DATA:  Initial treatment strategy for non-small cell lung cancer. EXAM: NUCLEAR MEDICINE PET SKULL BASE TO THIGH TECHNIQUE: 12.2 mCi F-18 FDG was injected intravenously. Full-ring PET imaging was performed from the skull base to thigh after the radiotracer. CT data was obtained and used for attenuation correction and anatomic localization. Fasting blood glucose: 115 mg/dl COMPARISON:  CT of the chest of June 21, 2020 FINDINGS: Mediastinal blood pool activity: SUV max 2.67 Liver activity: SUV max 3.74 NECK: No hypermetabolic lymph nodes in the neck. Incidental CT findings: None CHEST: Bulky pulmonary masses bilaterally. Largest on the LEFT in the upper lobe measures approximately 9.8 x 5 cm (SUVmax = 12.0) Second area of hypermetabolic activity in the LEFT chest is in the LEFT infrahilar region  measuring approximately 3.5 by 1.7 cm (SUVmax = 9.8) this is in the LEFT lower lobe/LEFT infrahilar region Cavitary mass in the RIGHT chest measuring 5.9 x 4.1 cm adjacent to the RIGHT hilum involving RIGHT upper and RIGHT lower lobe. Th (SUVmax = 13.2 increased metabolic activity tracks from the more masslike area in the upper lobe into the lower lobe just above the RIGHT hemidiaphragm.) No hypermetabolic lymph nodes in the chest. Incidental CT findings: Post median sternotomy for CABG. Extensive calcified coronary artery disease. No pericardial effusion. Mild cardiac enlargement. Small RIGHT pleural effusion. ABDOMEN/PELVIS: No abnormal hypermetabolic activity within the liver, pancreas, adrenal glands, or spleen. No hypermetabolic lymph nodes in the abdomen or pelvis. Incidental CT findings: Liver is unremarkable on CT. Post cholecystectomy. Spleen normal size and contour. No acute peripancreatic inflammation. No sign of hydronephrosis. Lower pole cyst of the RIGHT kidney. Atheromatous plaque in the abdominal aorta without aneurysmal dilation. Prostate unremarkable by CT imaging. SKELETON: No focal hypermetabolic activity to suggest skeletal metastasis. Incidental CT findings: Partially calcified lobulated soft tissue mass about the LEFT gluteal region in the subcutaneous fat primarily but also affecting LEFT gluteal musculature 10 x 6 cm displaying faint metabolic activity at the periphery which is similar to blood pool activity. IMPRESSION: 1. Bilateral pulmonary masses and consolidative changes with marked hypermetabolic activity compatible with pulmonary neoplasm with multifocal/bilateral involvement. 2. LEFT infrahilar adenopathy versus parenchymal disease. 3. No mediastinal adenopathy or distant disease. 4. Partially calcified soft tissue mass in the LEFT gluteal region with central lack of uptake and mild peripheral uptake is likely sequela of prior trauma or infection. Given size and other findings on the  current scan biopsy may however be helpful to exclude neoplasm. This does not have an appearance that would be compatible with metastatic disease comparison with previous imaging could also be helpful. Aortic Atherosclerosis (ICD10-I70.0). Electronically Signed   By:  Zetta Bills M.D.   On: 08/22/2020 15:37    ASSESSMENT AND PLAN: This is a very pleasant 79 years old white male diagnosed with a stage IV (T4, N2, M1 a) non-small cell lung cancer, mucinous adenocarcinoma presented with bilateral large masses in the left upper lobe as well as the right upper lobe in addition to other pulmonary nodules in the right upper lobe with hilar and mediastinal lymphadenopathy diagnosed in August 2021. Molecular study showed positive KRAS G12C mutation and PD-L1 expression of 20%. The patient is currently undergoing systemic chemotherapy with carboplatin for AUC of 5, Alimta 500 mg/M2 and Keytruda 200 mg IV every 3 weeks status post 1 cycle started last week. The patient tolerated the first week of his treatment fairly well with no concerning adverse effects. I recommended for the patient to continue his treatment as planned and he is expected to come back for follow-up visit in 2 weeks for evaluation before starting cycle #2. The patient was advised to call immediately if he has any concerning symptoms in the interval. The patient voices understanding of current disease status and treatment options and is in agreement with the current care plan.  All questions were answered. The patient knows to call the clinic with any problems, questions or concerns. We can certainly see the patient much sooner if necessary.  Disclaimer: This note was dictated with voice recognition software. Similar sounding words can inadvertently be transcribed and may not be corrected upon review.

## 2020-09-17 ENCOUNTER — Telehealth: Payer: Self-pay | Admitting: Medical Oncology

## 2020-09-17 NOTE — Telephone Encounter (Signed)
Sore throat started yesterday. Wife looked inside his mouth and noted redness on one  Side. She denies white spots or fever .  He drank a  warm beverage and it helped. She thinks it is related to sinus drainage. The pain is diminshed today .  Can he take Tylenol for sore throat?  I told her he can take Tylenol to see if it helps his throat and try Mucinex. I asked her to call back in am with update on symptom.

## 2020-09-18 ENCOUNTER — Encounter: Payer: Self-pay | Admitting: General Practice

## 2020-09-18 ENCOUNTER — Telehealth: Payer: Self-pay | Admitting: *Deleted

## 2020-09-18 NOTE — Telephone Encounter (Signed)
He can use mouthwash with salt water as well as honey cough drops.  Continue to monitor for now.

## 2020-09-18 NOTE — Progress Notes (Signed)
[employer] CSW Progress Notes  Call to patient/wife at request of nurse navigator, D Designer, multimedia.  Check in call to determine needs/resources  No answer at home number and no VM.  Mobile # was not in service at this time.  Will call again.  Edwyna Shell, LCSW Clinical Social Worker Phone:  [phone]

## 2020-09-18 NOTE — Telephone Encounter (Signed)
Wife states that Randall Wilkins took mucinex as directed yesterday for sore throat. He is now sore on both sides of his throat and is having a hard time swallowing. No fever. Please advise

## 2020-09-18 NOTE — Telephone Encounter (Signed)
Notified of message below

## 2020-09-19 ENCOUNTER — Other Ambulatory Visit: Payer: Self-pay

## 2020-09-19 ENCOUNTER — Encounter (HOSPITAL_COMMUNITY): Payer: Self-pay

## 2020-09-19 ENCOUNTER — Emergency Department (HOSPITAL_COMMUNITY)
Admission: EM | Admit: 2020-09-19 | Discharge: 2020-09-20 | Disposition: A | Discharge status: Home / Self Care | Payer: Medicare Other | Attending: Emergency Medicine | Admitting: Emergency Medicine

## 2020-09-19 ENCOUNTER — Emergency Department (HOSPITAL_COMMUNITY): Payer: Medicare Other

## 2020-09-19 DIAGNOSIS — N183 Chronic kidney disease, stage 3 unspecified: Secondary | ICD-10-CM | POA: Insufficient documentation

## 2020-09-19 DIAGNOSIS — K2980 Duodenitis without bleeding: Secondary | ICD-10-CM | POA: Diagnosis not present

## 2020-09-19 DIAGNOSIS — E1140 Type 2 diabetes mellitus with diabetic neuropathy, unspecified: Secondary | ICD-10-CM | POA: Insufficient documentation

## 2020-09-19 DIAGNOSIS — Z87891 Personal history of nicotine dependence: Secondary | ICD-10-CM | POA: Insufficient documentation

## 2020-09-19 DIAGNOSIS — I10 Essential (primary) hypertension: Secondary | ICD-10-CM | POA: Diagnosis not present

## 2020-09-19 DIAGNOSIS — E039 Hypothyroidism, unspecified: Secondary | ICD-10-CM | POA: Diagnosis not present

## 2020-09-19 DIAGNOSIS — C3490 Malignant neoplasm of unspecified part of unspecified bronchus or lung: Secondary | ICD-10-CM | POA: Insufficient documentation

## 2020-09-19 DIAGNOSIS — I130 Hypertensive heart and chronic kidney disease with heart failure and stage 1 through stage 4 chronic kidney disease, or unspecified chronic kidney disease: Secondary | ICD-10-CM | POA: Diagnosis not present

## 2020-09-19 DIAGNOSIS — Z951 Presence of aortocoronary bypass graft: Secondary | ICD-10-CM | POA: Diagnosis not present

## 2020-09-19 DIAGNOSIS — K1230 Oral mucositis (ulcerative), unspecified: Secondary | ICD-10-CM

## 2020-09-19 DIAGNOSIS — T451X5A Adverse effect of antineoplastic and immunosuppressive drugs, initial encounter: Secondary | ICD-10-CM

## 2020-09-19 DIAGNOSIS — Z20822 Contact with and (suspected) exposure to covid-19: Secondary | ICD-10-CM | POA: Insufficient documentation

## 2020-09-19 DIAGNOSIS — Z7984 Long term (current) use of oral hypoglycemic drugs: Secondary | ICD-10-CM | POA: Diagnosis not present

## 2020-09-19 DIAGNOSIS — I5032 Chronic diastolic (congestive) heart failure: Secondary | ICD-10-CM | POA: Diagnosis not present

## 2020-09-19 DIAGNOSIS — I2510 Atherosclerotic heart disease of native coronary artery without angina pectoris: Secondary | ICD-10-CM | POA: Insufficient documentation

## 2020-09-19 DIAGNOSIS — D701 Agranulocytosis secondary to cancer chemotherapy: Secondary | ICD-10-CM | POA: Insufficient documentation

## 2020-09-19 DIAGNOSIS — R109 Unspecified abdominal pain: Secondary | ICD-10-CM | POA: Diagnosis not present

## 2020-09-19 LAB — DIFFERENTIAL
Abs Immature Granulocytes: 0 10*3/uL (ref 0.00–0.07)
Basophils Absolute: 0 10*3/uL (ref 0.0–0.1)
Basophils Relative: 1 %
Eosinophils Absolute: 0.1 10*3/uL (ref 0.0–0.5)
Eosinophils Relative: 3 %
Immature Granulocytes: 0 %
Lymphocytes Relative: 50 %
Lymphs Abs: 1.1 10*3/uL (ref 0.7–4.0)
Monocytes Absolute: 0.1 10*3/uL (ref 0.1–1.0)
Monocytes Relative: 7 %
Neutro Abs: 0.8 10*3/uL — ABNORMAL LOW (ref 1.7–7.7)
Neutrophils Relative %: 39 %

## 2020-09-19 LAB — RESPIRATORY PANEL BY RT PCR (FLU A&B, COVID)
Influenza A by PCR: NEGATIVE
Influenza B by PCR: NEGATIVE
SARS Coronavirus 2 by RT PCR: NEGATIVE

## 2020-09-19 LAB — COMPREHENSIVE METABOLIC PANEL
ALT: 16 U/L (ref 0–44)
AST: 22 U/L (ref 15–41)
Albumin: 3 g/dL — ABNORMAL LOW (ref 3.5–5.0)
Alkaline Phosphatase: 60 U/L (ref 38–126)
Anion gap: 10 (ref 5–15)
BUN: 27 mg/dL — ABNORMAL HIGH (ref 8–23)
CO2: 29 mmol/L (ref 22–32)
Calcium: 8.4 mg/dL — ABNORMAL LOW (ref 8.9–10.3)
Chloride: 101 mmol/L (ref 98–111)
Creatinine, Ser: 1.24 mg/dL (ref 0.61–1.24)
GFR calc non Af Amer: 55 mL/min — ABNORMAL LOW (ref >60)
Glucose, Bld: 107 mg/dL — ABNORMAL HIGH (ref 70–99)
Potassium: 4.4 mmol/L (ref 3.5–5.1)
Sodium: 140 mmol/L (ref 135–145)
Total Bilirubin: 0.7 mg/dL (ref 0.3–1.2)
Total Protein: 6.2 g/dL — ABNORMAL LOW (ref 6.5–8.1)

## 2020-09-19 LAB — CBC
HCT: 39 % (ref 39.0–52.0)
Hemoglobin: 12.7 g/dL — ABNORMAL LOW (ref 13.0–17.0)
MCH: 30 pg (ref 26.0–34.0)
MCHC: 32.6 g/dL (ref 30.0–36.0)
MCV: 92 fL (ref 80.0–100.0)
Platelets: 38 10*3/uL — ABNORMAL LOW (ref 150–400)
RBC: 4.24 MIL/uL (ref 4.22–5.81)
RDW: 14.7 % (ref 11.5–15.5)
WBC: 1.9 10*3/uL — ABNORMAL LOW (ref 4.0–10.5)
nRBC: 0 % (ref 0.0–0.2)

## 2020-09-19 LAB — URINALYSIS, ROUTINE W REFLEX MICROSCOPIC
Bilirubin Urine: NEGATIVE
Glucose, UA: NEGATIVE mg/dL
Hgb urine dipstick: NEGATIVE
Ketones, ur: NEGATIVE mg/dL
Leukocytes,Ua: NEGATIVE
Nitrite: NEGATIVE
Protein, ur: NEGATIVE mg/dL
Specific Gravity, Urine: 1.008 (ref 1.005–1.030)
pH: 7 (ref 5.0–8.0)

## 2020-09-19 LAB — LACTIC ACID, PLASMA
Lactic Acid, Venous: 1.4 mmol/L (ref 0.5–1.9)
Lactic Acid, Venous: 1.2 mmol/L (ref 0.5–1.9)

## 2020-09-19 LAB — GROUP A STREP BY PCR: Group A Strep by PCR: NOT DETECTED

## 2020-09-19 LAB — LIPASE, BLOOD: Lipase: 42 U/L (ref 11–51)

## 2020-09-19 MED ORDER — FAMOTIDINE IN NACL 20-0.9 MG/50ML-% IV SOLN
20.0000 mg | Freq: Once | INTRAVENOUS | Status: AC
  Administered 2020-09-19: 20 mg via INTRAVENOUS
  Filled 2020-09-19: qty 50

## 2020-09-19 MED ORDER — IOHEXOL 300 MG/ML  SOLN
100.0000 mL | Freq: Once | INTRAMUSCULAR | Status: AC | PRN
  Administered 2020-09-19: 100 mL via INTRAVENOUS

## 2020-09-19 MED ORDER — CYANOCOBALAMIN 1000 MCG/ML IJ SOLN
1000.0000 ug | Freq: Once | INTRAMUSCULAR | Status: AC
  Administered 2020-09-19: 1000 ug via INTRAMUSCULAR
  Filled 2020-09-19: qty 1

## 2020-09-19 MED ORDER — MORPHINE SULFATE (PF) 4 MG/ML IV SOLN
4.0000 mg | Freq: Once | INTRAVENOUS | Status: AC
  Administered 2020-09-19: 4 mg via INTRAVENOUS
  Filled 2020-09-19: qty 1

## 2020-09-19 MED ORDER — LIDOCAINE VISCOUS HCL 2 % MT SOLN
15.0000 mL | Freq: Once | OROMUCOSAL | Status: AC
  Administered 2020-09-19: 15 mL via ORAL
  Filled 2020-09-19: qty 15

## 2020-09-19 MED ORDER — ALUM & MAG HYDROXIDE-SIMETH 200-200-20 MG/5ML PO SUSP
30.0000 mL | Freq: Once | ORAL | Status: AC
  Administered 2020-09-19: 30 mL via ORAL
  Filled 2020-09-19: qty 30

## 2020-09-19 MED ORDER — LIDOCAINE VISCOUS HCL 2 % MT SOLN: 15.0000 mL | OROMUCOSAL | 0 refills | Status: DC | PRN

## 2020-09-19 NOTE — Discharge Instructions (Addendum)
Please call Dr Inda Merlin tomorrow morning for close follow up   We talked to Dr Irene Limbo who recommends doubling up folic acid.  Mix tsp of salt and baking soda in 0.5 L room temperature water and use to do gargles for throat discomfort.  Soft pureed foods to prevent more inflammation.   Return for fever greater than 100, vomiting, diarrhea

## 2020-09-19 NOTE — ED Triage Notes (Signed)
Pt presents with c/o abdominal pain since yesterday. Pt denies any N/V/D. Pt reports he is a cancer pt, last chemo was 2 weeks ago.

## 2020-09-19 NOTE — ED Notes (Signed)
Patient aware we need urine sample, urinal at bedside.

## 2020-09-19 NOTE — ED Provider Notes (Addendum)
[facility]-EMERGENCY DEPT Provider Note   CSN: [id number] Arrival date & time: 09/19/20  1740     History Chief Complaint  Patient presents with  . Abdominal Pain    Randall Wilkins is a 79 y.o. 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 Sore throat worse on the right, worse with swallowing for the last 2-3 days. Denies fever, chills, congestion, cough, CP, SOB.  They called his oncologist Dr Mohammad who told them to take honey lozanges and take mucinex.  Wife states patient drank orange juice and "vomited" mucus after wards.  No nausea or vomiting since.  Developed periumbilical abdominal pain, constant non radiating this afternoon.  Denies nausea, vomiting, diarrhea, constipation, urinary symptoms. History of kidney stones and cholecystectomy. H/o bleeding stomach ulcer many years ago. Not on any anti-acid medicines. He denies black or blood stools.   HPI     Past Medical History:  Diagnosis Date  . Aortic stenosis, mild 11/14/2013  . Aortic valve sclerosis 03/29/2015  . Bilateral leg edema 05/21/2014  . CAD (coronary artery disease)   . Chronic diastolic congestive heart failure (HCC)   . Chronic kidney disease (CKD), stage III (moderate) (HCC)   . 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 Diabetic peripheral neuropathy associated with type 1 diabetes mellitus (HCC) 11/14/2013  . Dysphagia   . Dyspnea on exertion 03/21/2015  . Exogenous obesity   . Gout   . Heart attack (HCC)   . History of nuclear stress test 07/2011   dipyridamole; fixed inferolateral defect, worse at stress than rest; no reversible ischemia; low risk scan   . Hyperlipidemia   . Hypertension   . Hypothyroidism   . Insulin dependent diabetes mellitus   . Left foot drop   . Left main  coronary artery disease 03/28/2015  . Memory loss   . Obesity (BMI 30-39.9) 11/14/2013  . Obstructive sleep apnea 03/21/2015  . OSA on CPAP    uses a cpap  . Peptic ulcer with hemorrhage 03/28/2015  . Peripheral neuropathy   . Pneumonia   . Rhabdomyolysis   . 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 Stroke (HCC)    L patietal with small scattered lacunar infarcts  . Thrombocytopenia (HCC) 03/21/2015  . Venous insufficiency   . Weakness generalized 03/21/2015    Patient Active Problem List   Diagnosis Date Noted  . Adenocarcinoma of right lung, stage 4 (HCC) 09/02/2020  . Encounter for antineoplastic immunotherapy 09/02/2020  . Goals of care, counseling/discussion 08/20/2020  . Encounter for antineoplastic chemotherapy 08/20/2020  . Mediastinal lymphadenopathy 08/06/2020  . Lung mass 06/05/2020  . Essential hypertension 03/23/2018  . Mild cognitive impairment 12/24/2017  . Gait abnormality 12/24/2017  . Murmur, cardiac 03/04/2017  . History of stroke 12/12/2015  . Gait disorder 12/12/2015  . Coronary artery disease involving native coronary artery of native heart without angina pectoris   . S/P CABG x 2 03/29/2015  . Aortic valve sclerosis 03/29/2015  . Peptic ulcer with hemorrhage 03/28/2015  . Left main coronary artery disease 03/28/2015  . Chronic diastolic congestive heart failure (HCC)   . Chronic kidney disease (CKD), stage III (moderate) (HCC)   . Dysphagia   . AKI (acute kidney injury) (HCC)   .   Acute respiratory failure with hypoxia (HCC) 03/25/2015  . NSTEMI (non-ST elevated myocardial infarction) (HCC) 03/25/2015  . Shortness of breath   . UTI (lower urinary tract infection)   . Subendocardial MI subsequent episode care (HCC) 03/24/2015  . SOB (shortness of breath)   . Confusion 03/21/2015  . Weakness generalized 03/21/2015  . Increased urinary frequency 03/21/2015  . Hyperglycemia 03/21/2015  . Thrombocytopenia (HCC) 03/21/2015   . UTI (urinary tract infection) 03/21/2015  . Fall 03/21/2015  . Hematoma of abdominal wall 03/21/2015  . Diarrhea 03/21/2015  . Obstructive sleep apnea 03/21/2015  . Acute renal failure (HCC) 03/21/2015  . Mild diastolic dysfunction 03/21/2015  . Dyspnea on exertion 03/21/2015  . Neuropathy 09/11/2014  . Bilateral leg edema 05/21/2014  . Diabetic peripheral neuropathy associated with type 1 diabetes mellitus (HCC) 11/14/2013  . Type 1 diabetes mellitus with nephropathy (HCC) 11/14/2013  . Aortic valve stenosis 11/14/2013  . Obesity (BMI 30-39.9) 11/14/2013  . Asymptomatic PVCs 11/14/2013  . Memory impairment 02/04/2013  . Hyperlipidemia 02/04/2013  . 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 Cerebral infarction (HCC) 02/04/2013  . CAD S/P percutaneous coronary angioplasty 11/14/2004    Past Surgical History:  Procedure Laterality Date  . BACK SURGERY  2002   lumbosacral. 11 back surgeries total  . BRONCHIAL BIOPSY  08/06/2020   Procedure: BRONCHIAL BIOPSIES;  Surgeon: Byrum, Robert S, MD;  Location: [facility];  Service: Pulmonary;;  . BRONCHIAL BRUSHINGS  08/06/2020   Procedure: BRONCHIAL BRUSHINGS;  Surgeon: Byrum, Robert S, MD;  Location: [facility];  Service: Pulmonary;;  . BRONCHIAL NEEDLE ASPIRATION BIOPSY  08/06/2020   Procedure: BRONCHIAL NEEDLE ASPIRATION BIOPSIES;  Surgeon: Byrum, Robert S, MD;  Location: [facility];  Service: Pulmonary;;  . Carotid Doppler  03/2013   bilat bulb/prox ICAs - mild amount of fibrous plaque with no evidence of diameter reduction  . CARPAL TUNNEL RELEASE Bilateral 08/09/2014   Procedure: BILATERAL CARPAL TUNNEL RELEASE;  Surgeon: Gary Kuzma, MD;  Location: [facility];  Service: Orthopedics;  Laterality: Bilateral;  ANESTHESIA:  IV REGIONAL BIL FAB  . CHOLECYSTECTOMY    . COLONOSCOPY    . CORONARY ANGIOPLASTY  10/13/1996  . CORONARY ANGIOPLASTY  09/21/1989   emergency PTCA  . CORONARY ANGIOPLASTY  10/13/1996    Multi-Link diagonal & OD stenting (Dr. R. Weintraub)  . CORONARY ANGIOPLASTY  12/03/1997   disease of mid DX-1 ~50% & in mid PLA & PDA (distal lesions) (Dr. R. Weintraub)   . CORONARY ANGIOPLASTY  10/14/1999   progression of disease distal PLA & PDA; progression of disease prox RCA - moderate (Dr. R. Weintraub)   . CORONARY ANGIOPLASTY WITH STENT PLACEMENT  04/04/2004   4.0x23mm non-DES (thrombectomy via AngioJet) to RCA for high grade stenosis (Dr. R. Weintraub)  . CORONARY ARTERY BYPASS GRAFT N/A 03/29/2015   Procedure: CORONARY ARTERY BYPASS GRAFTING TIMES TWO USING LEFT INTERNAL MAMMARY ARTERY AND RIGHT LEG GREATER SAPHENOUS VEIN HARVESTED ENDOSCOPICALLY.;  Surgeon: Clarence H Owen, MD;  Location: [facility];  Service: Open Heart Surgery;  Laterality: N/A;  . ESOPHAGOGASTRODUODENOSCOPY N/A 03/27/2015   Procedure: ESOPHAGOGASTRODUODENOSCOPY (EGD);  Surgeon: Marc Magod, MD;  Location: [facility];  Service: Endoscopy;  Laterality: N/A;  possible dilation  . FINE NEEDLE ASPIRATION  08/06/2020   Procedure: FINE NEEDLE ASPIRATION (FNA) LINEAR;  Surgeon: Byrum, Robert S, MD;  Location: [facility];  Service: Pulmonary;;  . LEFT HEART CATHETERIZATION WITH CORONARY ANGIOGRAM N/A 03/28/2015   Procedure: LEFT HEART CATHETERIZATION   WITH CORONARY ANGIOGRAM;  Surgeon: Peter M Mcavoy, MD;  Location: [facility];  Service: Cardiovascular;  Laterality: N/A;  . SINUS ENDO W/FUSION    . TONSILLECTOMY    . TRANSTHORACIC ECHOCARDIOGRAM  08/08/2013   EF 55-60%, mild conc hypertrophy, grade 1 diastolic dysfunction; AV with mild stenosis; LA & RA mildly dilated  . VIDEO BRONCHOSCOPY WITH ENDOBRONCHIAL NAVIGATION N/A 08/06/2020   Procedure: VIDEO BRONCHOSCOPY WITH ENDOBRONCHIAL NAVIGATION;  Surgeon: Byrum, Robert S, MD;  Location: [facility];  Service: Pulmonary;  Laterality: N/A;  . VIDEO BRONCHOSCOPY WITH ENDOBRONCHIAL ULTRASOUND N/A 08/06/2020   Procedure: VIDEO BRONCHOSCOPY WITH ENDOBRONCHIAL ULTRASOUND;  Surgeon:  Byrum, Robert S, MD;  Location: [facility];  Service: Pulmonary;  Laterality: N/A;       Family History  Problem Relation Age of Onset  . Heart disease Mother   . Coronary artery disease Father   . Cancer Maternal Grandmother   . Heart Problems Maternal Grandfather   . Diabetes Son        borderline     Social History   Tobacco Use  . Smoking status: Former Smoker    Packs/day: 2.00    Years: 20.00    Pack years: 40.00    Types: Cigarettes    Quit date: 01/25/1974    Years since quitting: 46.6  . Smokeless tobacco: Never Used  Vaping Use  . Vaping Use: Never used  Substance Use Topics  . Alcohol use: No    Alcohol/week: 0.0 standard drinks  . Drug use: No    Home Medications Prior to Admission medications   Medication Sig Start Date End Date Taking? Authorizing Provider  albuterol (VENTOLIN HFA) 108 (90 Base) MCG/ACT inhaler Inhale 2 puffs into the lungs every 6 (six) hours as needed for wheezing or shortness of breath. 09/04/20  Yes Byrum, Robert S, MD  allopurinol (ZYLOPRIM) 300 MG tablet Take 300 mg by mouth daily.  02/20/16  Yes [provider]  amitriptyline (ELAVIL) 50 MG tablet Take 50 mg by mouth at bedtime.    Yes [provider]  apixaban (ELIQUIS) 5 MG TABS tablet Take 1 tablet (5 mg total) by mouth 2 (two) times daily. You may restart this medication on Thursday, 08/08/2020. Patient taking differently: Take 5 mg by mouth 2 (two) times daily.  08/06/20  Yes Byrum, Robert S, MD  aspirin 81 MG tablet Take 1 tablet (81 mg total) by mouth daily. 04/29/15  Yes Owen, Clarence H, MD  donepezil (ARICEPT) 10 MG tablet TAKE ONE TABLET DAILY.  PLEASE CALL [phone] TO SCHEDULE FOLLOW UP. Patient taking differently: Take 10 mg by mouth daily.  07/10/19  Yes Yan, Yijun, MD  folic acid (FOLVITE) 1 MG tablet Take 1 tablet (1 mg total) by mouth daily. 09/02/20  Yes Mohamed, Mohamed, MD  furosemide (LASIX) 40 MG tablet Take 80 mg by mouth in the morning.  03/11/20   Yes [provider]  insulin degludec (TRESIBA FLEXTOUCH) 200 UNIT/ML FlexTouch Pen Inject 46 Units into the skin at bedtime.   Yes [provider]  insulin lispro (HUMALOG) 100 UNIT/ML cartridge Inject 5-15 Units into the skin 3 (three) times daily with meals. Sliding scale.   Yes [provider]  levothyroxine (SYNTHROID, LEVOTHROID) 75 MCG tablet Take 75 mcg by mouth daily before breakfast.  05/14/17  Yes [provider]  liraglutide (VICTOZA) 18 MG/3ML SOPN Inject 1.2 mg into the skin daily.    Yes [provider]  lisinopril (ZESTRIL) 10 MG tablet   TAKE ONE TABLET DAILY Patient taking differently: Take 10 mg by mouth daily.  08/12/20  Yes Kumar, Ajay, MD  metFORMIN (GLUCOPHAGE-XR) 750 MG 24 hr tablet TAKE 1 TABLET WITH DINNER Patient taking differently: Take 750 mg by mouth daily.  08/12/20  Yes Kumar, Ajay, MD  metoprolol succinate (TOPROL-XL) 25 MG 24 hr tablet Take 1 tablet (25 mg total) by mouth daily. 03/13/20  Yes Hilty, Kenneth C, MD  potassium chloride SA (K-DUR,KLOR-CON) 20 MEQ tablet Take 20 mEq by mouth 2 (two) times daily.    Yes [provider]  rosuvastatin (CRESTOR) 20 MG tablet Take 20 mg by mouth daily.   Yes [provider]  Tamsulosin HCl (FLOMAX) 0.4 MG CAPS Take 0.4 mg by mouth daily.    Yes [provider]  torsemide (DEMADEX) 10 MG tablet Take 10 mg by mouth every evening.    Yes [provider]  vitamin B-12 (CYANOCOBALAMIN) 1000 MCG tablet Take 1,000 mcg by mouth daily.   Yes [provider]  Blood Glucose Monitoring Suppl (CONTOUR NEXT MONITOR) w/Device KIT 1 Device by Does not apply route 3 (three) times daily. Use to check blood sugars 3 times daily. Dx Code E13.9 07/29/17   Kumar, Ajay, MD  CONTOUR NEXT TEST test strip USE 1 STRIP TO CHECK GLUCOSE 4 TIMES DAILY 08/09/20   Kumar, Ajay, MD  Insulin Pen Needle (EASY COMFORT PEN NEEDLES) 33G X 4 MM MISC 1 each by Does not apply route See  admin instructions. Use to inject insulin 5 times daily. 05/31/19   Kumar, Ajay, MD  lidocaine (XYLOCAINE) 2 % solution Use as directed 15 mLs in the mouth or throat as needed for mouth pain. 09/19/20   ,  J, PA-C  NON FORMULARY Inhale 1 application into the lungs at bedtime. CPAP    [provider]  prochlorperazine (COMPAZINE) 10 MG tablet Take 1 tablet (10 mg total) by mouth every 6 (six) hours as needed for nausea or vomiting. 09/02/20   Mohamed, Mohamed, MD    Allergies    Actos [pioglitazone], Metformin and related, and Niaspan [niacin er]  Review of Systems   Review of Systems  HENT: Positive for sore throat.   Gastrointestinal: Positive for abdominal pain.  All other systems reviewed and are negative.   Physical Exam Updated Vital Signs BP 96/76   Pulse 76   Temp 99.3 F (37.4 C) (Rectal)   Resp 20   SpO2 95%   Physical Exam Vitals and nursing note reviewed.  Constitutional:      Appearance: He is well-developed.     Comments: Non toxic.  HENT:     Head: Normocephalic and atraumatic.     Nose: Nose normal.     Mouth/Throat:     Pharynx: Posterior oropharyngeal erythema present.     Comments: Diffuse erythema tonsillar pillars, oropharynx and uvula. No asymmetry, edema, exudates. Normal SL space. Normal tongue protrusion, pronation. Dry lips and MM. No vesicular lesions. No thrush. Tolerating secretions.  Eyes:     Conjunctiva/sclera: Conjunctivae normal.  Neck:     Comments: Mildly tender submandibular lymph nodes, symmetric. Trachea midline. No significant APL neck edema.  Cardiovascular:     Rate and Rhythm: Normal rate and regular rhythm.     Heart sounds: Normal heart sounds.  Pulmonary:     Effort: Pulmonary effort is normal.     Breath sounds: Normal breath sounds.  Abdominal:     General: Bowel sounds are normal.       Palpations: Abdomen is soft.     Tenderness: There is abdominal tenderness in the periumbilical area. There is  guarding.     Comments: Epigastric and periumbilical tenderness with guarding.  Obese, protuberant abdomen. No fluid wave. No firmness/rigidity. suprapubic or CVA tenderness. Negative Murphy's and McBurney's  Musculoskeletal:        General: Normal range of motion.     Cervical back: Normal range of motion.  Lymphadenopathy:     Cervical: Cervical adenopathy present.  Skin:    General: Skin is warm and dry.     Capillary Refill: Capillary refill takes less than 2 seconds.  Neurological:     Mental Status: He is alert.  Psychiatric:        Behavior: Behavior normal.     ED Results / Procedures / Treatments   Labs (all labs ordered are listed, but only abnormal results are displayed) Labs Reviewed  COMPREHENSIVE METABOLIC PANEL - Abnormal; Notable for the following components:      Result Value   Glucose, Bld 107 (*)    BUN 27 (*)    Calcium 8.4 (*)    Total Protein 6.2 (*)    Albumin 3.0 (*)    GFR calc non Af Amer 55 (*)    All other components within normal limits  CBC - Abnormal; Notable for the following components:   WBC 1.9 (*)    Hemoglobin 12.7 (*)    Platelets 38 (*)    All other components within normal limits  DIFFERENTIAL - Abnormal; Notable for the following components:   Neutro Abs 0.8 (*)    All other components within normal limits  GROUP A STREP BY PCR  RESPIRATORY PANEL BY RT PCR (FLU A&B, COVID)  LIPASE, BLOOD  URINALYSIS, ROUTINE W REFLEX MICROSCOPIC  LACTIC ACID, PLASMA  LACTIC ACID, PLASMA    EKG EKG Interpretation  Date/Time:  Thursday September 19 2020 19:39:14 EDT Ventricular Rate:  73 PR Interval:    QRS Duration: 92 QT Interval:  416 QTC Calculation: 459 R Axis:   -40 Text Interpretation: Atrial fibrillation Left anterior fascicular block Abnormal R-wave progression, late transition Confirmed by Adam, Curatolo ([id number]) on 09/19/2020 8:47:08 PM   Radiology CT ABDOMEN PELVIS W CONTRAST  Result Date: 09/19/2020 CLINICAL DATA:  History  of non-small fall lung cancer, epigastric abdominal pain EXAM: CT ABDOMEN AND PELVIS WITH CONTRAST TECHNIQUE: Multidetector CT imaging of the abdomen and pelvis was performed using the standard protocol following bolus administration of intravenous contrast. CONTRAST:  100mL OMNIPAQUE IOHEXOL 300 MG/ML  SOLN COMPARISON:  Head CT August 22, 2020 FINDINGS: Lower chest: There is mild cardiomegaly. Aortic atherosclerosis seen. Again noted is a partially visualized rounded airspace consolidation/mass in the posterior right lower lobe and left infrahilar region. A trace right pleural effusion is present. There is also a 9 mm posterior left lower lobe nodule which was seen on the prior exam. Hepatobiliary: The liver is normal in density without focal abnormality.The main portal vein is patent. The patient is status post cholecystectomy. No biliary ductal dilation. Pancreas: There is mild inflammatory changes which there seen adjacent to the pancreatic head which could be from the duodenum. No pancreatic ductal dilatation, or fluid collections. Spleen: Normal in size without focal abnormality. Adrenals/Urinary Tract: Both adrenal glands appear normal. A 3 cm low-density lesion seen off lower pole of the right kidney. Bladder is unremarkable. Stomach/Bowel: A small hiatal hernia is present. The stomach is otherwise unremarkable. There is diffuse wall thickening seen throughout   the duodenum with hyperenhancement and surrounding mesenteric fat stranding changes. A possible focal diverticulum is within the second portion of the duodenum. No definite loculated fluid collections or free air however are noted. Inflammatory changes are seen tracking into the right pericolic gutter. The remainder of the small bowel is unremarkable. There is a moderate amount of stool however it is unremarkable. The appendix is unremarkable Vascular/Lymphatic: There are no enlarged mesenteric, retroperitoneal, or pelvic lymph nodes. Scattered  aortic atherosclerotic calcifications are seen without aneurysmal dilatation. Reproductive: The prostate is unremarkable. Other: No evidence of abdominal wall mass or hernia. Musculoskeletal: No acute or significant osseous findings. Again noted is a lobular soft tissue mass seen overlying the left gluteal soft tissues with internal rounded calcifications. This may be due to prior injury. IMPRESSION: Findings suggestive of extensive acute duodenitis which may be due to infectious or inflammatory process. Probable duodenal diverticulum is noted however no loculated fluid collections or free air. Mild inflammatory changes around the pancreatic head which may be due to the adjacent duodenum versus concomitant mild pancreatitis. Rounded areas of masslike consolidation in the bilateral lungs as on the recent PET CT August 22, 2020. Stable 9 mm pulmonary nodule in the posterior left lower lung Aortic Atherosclerosis (ICD10-I70.0). Electronically Signed   By: Bindu  Avutu M.D.   On: 09/19/2020 22:23    Procedures Procedures (including critical care time)  Medications Ordered in ED Medications  cyanocobalamin ((VITAMIN B-12)) injection 1,000 mcg (has no administration in time range)  morphine 4 MG/ML injection 4 mg (4 mg Intravenous Given 09/19/20 1932)  alum & mag hydroxide-simeth (MAALOX/MYLANTA) 200-200-20 MG/5ML suspension 30 mL (30 mLs Oral Given 09/19/20 2245)    And  lidocaine (XYLOCAINE) 2 % viscous mouth solution 15 mL (15 mLs Oral Given 09/19/20 2245)  famotidine (PEPCID) IVPB 20 mg premix (20 mg Intravenous New Bag/Given 09/19/20 2258)  iohexol (OMNIPAQUE) 300 MG/ML solution 100 mL (100 mLs Intravenous Contrast Given 09/19/20 2159)    ED Course  I have reviewed the triage vital signs and the nursing notes.  Pertinent labs & imaging results that were available during my care of the patient were reviewed by me and considered in my medical decision making (see chart for details).  Clinical Course  as of Sep 19 2334  Thu Sep 19, 2020  2046 WBC(!): 1.9 [CG]  2046 SARS Coronavirus 2 by RT PCR: NEGATIVE [CG]  2046 Influenza A By PCR: NEGATIVE [CG]  2046 Influenza B By PCR: NEGATIVE [CG]  2046 Group A Strep by PCR: NOT DETECTED [CG]  2046 Lactic Acid, Venous: 1.4 [CG]  2046 Negative   Urinalysis, Routine w reflex microscopic [CG]  2046 AST: 22 [CG]  2046 ALT: 16 [CG]  2046 Alkaline Phosphatase: 60 [CG]  2230 NEUT#(!): 0.8 [CG]  2231 IMPRESSION: Findings suggestive of extensive acute duodenitis which may be due to infectious or inflammatory process. Probable duodenal diverticulum is noted however no loculated fluid collections or free air.  Mild inflammatory changes around the pancreatic head which may be due to the adjacent duodenum versus concomitant mild pancreatitis.  Rounded areas of masslike consolidation in the bilateral lungs as on the recent PET CT August 22, 2020.  Stable 9 mm pulmonary nodule in the posterior left lower lung  Aortic Atherosclerosis (ICD10-I70.0).   CT ABDOMEN PELVIS W CONTRAST [CG]  2249 Spoke to Dr Kale.  Suspects symptoms due to chemotherapy.  Does not appear patient had growth factors after chemotherapy. Recommends doubling folic acid dose, 100 mc B12   IM here. Salt/baking soda 1 tsp in room temperature 0.5L water, call office in AM.    [CG]  2256 Lipase: 42 [CG]  2307 Atrial fibrillation Left anterior fascicular block Abnormal R-wave progression, late transition Confirmed by Adam, Curatolo ([id number]) on 09/19/2020 8:47:08 PM  Repeat EKG [CG]  2307 Second degree AV block, mobitz 1 Left atrial enlargement Borderline left axis deviation Abnormal R-wave progression, late transition Confirmed by Adam, Curatolo ([id number]) on 09/19/2020 8:47:30 PM  ED EKG [CG]    Clinical Course User Index [CG] ,  J, PA-C   MDM Rules/Calculators/A&P                          79 yo male with stage IV non small cell lung cancer, mucinous  adenocarcinoma diagnosed August 2021 on systemic chemotherapy (carboplatin, Alimta and Keytruda) s/p cycle 1 on 9/27/202 presents for sore throat and periumbilical abdominal pain. No fever, chills. No vomiting, diarrhea. No URI symptoms.  H/o ulcers. Not on anti-acid medicines.  EMR triage and nursing notes reviewed to assist with MDM and obtain more history.   ER work up initiated in triage including CBC CMP lipase UA.  I have added lactic acid, respiratory panel, CTAP.  EKG obtained given periumbilical epigastric abdominal pain, previous h/o CAD s/p CABG however patient has no cardiac symptoms, CP, SOB. No neuro or pulse deficits distally.   ER work up personally visualized and interpreted.   ER work up reveals low WBC 1.9, neutrophil # 0.8, PLT 28. Hgb 12.7.  Normal LFT, lipase, creatinine. UA without infection or RBCs. Normal lactic acid. EKGs reveals atrial fibrillation with history of same. Repeat with Mobitz two block. Again patient without cardiac symptoms.    CTAP reveals acute duodenitis infectious vs inflammatory, favoring the latter as patient has no fever, vomiting, diarrhea and in setting of recent chemotherapy.  Some mild inflammatory changes around pancreatic head just adjacent to duodenum likely reactive because lipase normal.   Consulted Dr Kale with oncology who suspects duodenitis/pharyngitis likely due to chemotherapy.  Recommendations above.  Patient to follow up with oncology tomorrow morning. No recommendation to obtain blood culture, antibiotics. Appropriate for discharge.   Patient care shared with EDP who also evaluated patient.  Final Clinical Impression(s) / ED Diagnoses Final diagnoses:  Duodenitis  Mucositis  Chemotherapy-induced neutropenia (HCC)    Rx / DC Orders ED Discharge Orders         Ordered    lidocaine (XYLOCAINE) 2 % solution  As needed        09/19/20 2334             ,  J, PA-C 09/19/20 2336    Curatolo, Adam,  DO 09/22/20 1554    Curatolo, Adam, DO 09/22/20 1554  

## 2020-09-19 NOTE — ED Provider Notes (Signed)
Medical screening examination/treatment/procedure(s) were conducted as a shared visit with non-physician practitioner(s) and myself.  I personally evaluated the patient during the encounter. Briefly, the patient is a 79 y.o. male with history of lung cancer on active chemotherapy, heart failure, CKD, CAD who presents the ED with abdominal pain, sore throat. Last round of chemo was about 10 days ago. Denies any fevers or chills. Tender mostly in the epigastric region. He has some erythema in the back of his throat but no obvious abscess or swelling. No trismus. No drooling. EKG shows second-degree heart block [place] 1. Similar to prior. Patient with negative strep test. No fever. No leukocytosis, no lactic acidosis. Does have diminished white count and platelets from baseline. Hemoglobin slightly below baseline as well at 12.7. ANC is 0.8. CT scan of the abdomen and pelvis shows acute duodenitis. Likely inflammatory versus infectious. There are no free fluid or free air. Lipase is normal. Doubt pancreatitis. Felt better after GI cocktail. My PA talked with oncology who recommended B12 and folate acid. Likely inflammatory process secondary to chemo. Will follow-up outpatient with oncology. Understands return precautions. At this time no antibiotics as low concern for infectious process. Discharged in good condition.  This chart was dictated using voice recognition software.  Despite best efforts to proofread,  errors can occur which can change the documentation meaning.     EKG Interpretation  Date/Time:  Thursday September 19 2020 19:39:14 EDT Ventricular Rate:  73 PR Interval:    QRS Duration: 92 QT Interval:  416 QTC Calculation: 459 R Axis:   -40 Text Interpretation: Atrial fibrillation Left anterior fascicular block Abnormal R-wave progression, late transition Confirmed by Lennice Sites [phone]) on 09/19/2020 8:47:08 PM           Lennice Sites, DO 09/19/20 2302

## 2020-09-20 ENCOUNTER — Telehealth: Payer: Self-pay

## 2020-09-20 NOTE — Telephone Encounter (Signed)
Pts wife, Mickel Baas, [place] advising pt was seen in the ED 09/19/20 with abdominal pain and vomiting mucus.  I spoke with pts wife and advised per [facility], pt to hold Eliquis until lab appt 09/23/20. I also advised Mrs. Martinique to stay in the building on Monday until the results are reviewed to ensure there are no further instructions needed. She v/u of this information.

## 2020-09-23 ENCOUNTER — Inpatient Hospital Stay: Payer: Medicare Other

## 2020-09-23 ENCOUNTER — Telehealth: Payer: Self-pay

## 2020-09-23 ENCOUNTER — Other Ambulatory Visit: Payer: Self-pay

## 2020-09-23 DIAGNOSIS — C3491 Malignant neoplasm of unspecified part of right bronchus or lung: Secondary | ICD-10-CM

## 2020-09-23 DIAGNOSIS — Z5111 Encounter for antineoplastic chemotherapy: Secondary | ICD-10-CM | POA: Diagnosis not present

## 2020-09-23 DIAGNOSIS — Z5112 Encounter for antineoplastic immunotherapy: Secondary | ICD-10-CM | POA: Diagnosis not present

## 2020-09-23 DIAGNOSIS — Z79899 Other long term (current) drug therapy: Secondary | ICD-10-CM | POA: Diagnosis not present

## 2020-09-23 DIAGNOSIS — C3431 Malignant neoplasm of lower lobe, right bronchus or lung: Secondary | ICD-10-CM | POA: Diagnosis not present

## 2020-09-23 DIAGNOSIS — Z5189 Encounter for other specified aftercare: Secondary | ICD-10-CM | POA: Diagnosis not present

## 2020-09-23 DIAGNOSIS — C3412 Malignant neoplasm of upper lobe, left bronchus or lung: Secondary | ICD-10-CM | POA: Diagnosis not present

## 2020-09-23 LAB — CMP (CANCER CENTER ONLY)
ALT: 19 U/L (ref 0–44)
AST: 19 U/L (ref 15–41)
Albumin: 2.4 g/dL — ABNORMAL LOW (ref 3.5–5.0)
Alkaline Phosphatase: 75 U/L (ref 38–126)
Anion gap: 4 — ABNORMAL LOW (ref 5–15)
BUN: 19 mg/dL (ref 8–23)
CO2: 32 mmol/L (ref 22–32)
Calcium: 8.9 mg/dL (ref 8.9–10.3)
Chloride: 105 mmol/L (ref 98–111)
Creatinine: 1.41 mg/dL — ABNORMAL HIGH (ref 0.61–1.24)
GFR, Estimated: 47 mL/min — ABNORMAL LOW (ref >60)
Glucose, Bld: 74 mg/dL (ref 70–99)
Potassium: 4.1 mmol/L (ref 3.5–5.1)
Sodium: 141 mmol/L (ref 135–145)
Total Bilirubin: 0.3 mg/dL (ref 0.3–1.2)
Total Protein: 5.9 g/dL — ABNORMAL LOW (ref 6.5–8.1)

## 2020-09-23 LAB — CBC WITH DIFFERENTIAL (CANCER CENTER ONLY)
Abs Immature Granulocytes: 0.01 10*3/uL (ref 0.00–0.07)
Basophils Absolute: 0 10*3/uL (ref 0.0–0.1)
Basophils Relative: 0 %
Eosinophils Absolute: 0 10*3/uL (ref 0.0–0.5)
Eosinophils Relative: 1 %
HCT: 34.7 % — ABNORMAL LOW (ref 39.0–52.0)
Hemoglobin: 11.2 g/dL — ABNORMAL LOW (ref 13.0–17.0)
Immature Granulocytes: 1 %
Lymphocytes Relative: 58 %
Lymphs Abs: 0.8 10*3/uL (ref 0.7–4.0)
MCH: 29.3 pg (ref 26.0–34.0)
MCHC: 32.3 g/dL (ref 30.0–36.0)
MCV: 90.8 fL (ref 80.0–100.0)
Monocytes Absolute: 0.3 10*3/uL (ref 0.1–1.0)
Monocytes Relative: 22 %
Neutro Abs: 0.2 10*3/uL — CL (ref 1.7–7.7)
Neutrophils Relative %: 18 %
Platelet Count: 66 10*3/uL — ABNORMAL LOW (ref 150–400)
RBC: 3.82 MIL/uL — ABNORMAL LOW (ref 4.22–5.81)
RDW: 14.5 % (ref 11.5–15.5)
WBC Count: 1.3 10*3/uL — ABNORMAL LOW (ref 4.0–10.5)
nRBC: 0 % (ref 0.0–0.2)

## 2020-09-23 MED ORDER — FILGRASTIM-SNDZ 480 MCG/0.8ML IJ SOSY
PREFILLED_SYRINGE | INTRAMUSCULAR | Status: AC
  Filled 2020-09-23: qty 0.8

## 2020-09-23 MED ORDER — FILGRASTIM-SNDZ 480 MCG/0.8ML IJ SOSY
480.0000 ug | PREFILLED_SYRINGE | Freq: Once | INTRAMUSCULAR | Status: AC
  Administered 2020-09-23: 480 ug via SUBCUTANEOUS

## 2020-09-23 MED ORDER — TBO-FILGRASTIM 480 MCG/0.8ML ~~LOC~~ SOSY: 480.0000 ug | PREFILLED_SYRINGE | Freq: Once | SUBCUTANEOUS | Status: DC

## 2020-09-23 NOTE — Patient Instructions (Signed)

## 2020-09-23 NOTE — Progress Notes (Signed)
Per Dr. Julien Nordmann orders placed for Granix 480 mcg. Patient to receive Granix today and tomorrow. Patient is aware.

## 2020-09-23 NOTE — Progress Notes (Signed)
CRITICAL VALUE STICKER  CRITICAL VALUE: ANC 0.2  RECEIVER (on-site recipient of call): Wendall Mola, RN  DATE & TIME NOTIFIED: 09/23/2020 @ 1022  MESSENGER (representative from lab): M. Nicole Kindred MD NOTIFIED: Dr. Curt Bears  TIME OF NOTIFICATION: 2080  RESPONSE: Acknowledged. Patient to receive Granix today and tomorrow.

## 2020-09-23 NOTE — Telephone Encounter (Signed)
Today's labs reviewed by Dr. Julien Nordmann and per Dr. Julien Nordmann it is ok for patient to resume taking Eliquis. Patient and his wife made aware and verbalized understanding.

## 2020-09-24 ENCOUNTER — Inpatient Hospital Stay: Payer: Medicare Other

## 2020-09-24 ENCOUNTER — Other Ambulatory Visit: Payer: Self-pay

## 2020-09-24 VITALS — BP 131/52 | HR 86 | Temp 98.3°F | Resp 18

## 2020-09-24 DIAGNOSIS — Z5112 Encounter for antineoplastic immunotherapy: Secondary | ICD-10-CM | POA: Diagnosis not present

## 2020-09-24 DIAGNOSIS — C3412 Malignant neoplasm of upper lobe, left bronchus or lung: Secondary | ICD-10-CM | POA: Diagnosis not present

## 2020-09-24 DIAGNOSIS — Z5111 Encounter for antineoplastic chemotherapy: Secondary | ICD-10-CM | POA: Diagnosis not present

## 2020-09-24 DIAGNOSIS — C3431 Malignant neoplasm of lower lobe, right bronchus or lung: Secondary | ICD-10-CM | POA: Diagnosis not present

## 2020-09-24 DIAGNOSIS — Z5189 Encounter for other specified aftercare: Secondary | ICD-10-CM | POA: Diagnosis not present

## 2020-09-24 DIAGNOSIS — Z79899 Other long term (current) drug therapy: Secondary | ICD-10-CM | POA: Diagnosis not present

## 2020-09-24 DIAGNOSIS — C3491 Malignant neoplasm of unspecified part of right bronchus or lung: Secondary | ICD-10-CM

## 2020-09-24 MED ORDER — FILGRASTIM-SNDZ 480 MCG/0.8ML IJ SOSY
PREFILLED_SYRINGE | INTRAMUSCULAR | Status: AC
  Filled 2020-09-24: qty 0.8

## 2020-09-24 MED ORDER — FILGRASTIM-SNDZ 480 MCG/0.8ML IJ SOSY
480.0000 ug | PREFILLED_SYRINGE | Freq: Once | INTRAMUSCULAR | Status: AC
  Administered 2020-09-24: 480 ug via SUBCUTANEOUS

## 2020-09-24 NOTE — Patient Instructions (Signed)

## 2020-09-26 DIAGNOSIS — E1149 Type 2 diabetes mellitus with other diabetic neurological complication: Secondary | ICD-10-CM | POA: Diagnosis not present

## 2020-09-26 DIAGNOSIS — E1129 Type 2 diabetes mellitus with other diabetic kidney complication: Secondary | ICD-10-CM | POA: Diagnosis not present

## 2020-09-26 DIAGNOSIS — I10 Essential (primary) hypertension: Secondary | ICD-10-CM | POA: Diagnosis not present

## 2020-09-26 DIAGNOSIS — N183 Chronic kidney disease, stage 3 unspecified: Secondary | ICD-10-CM | POA: Diagnosis not present

## 2020-09-30 ENCOUNTER — Other Ambulatory Visit: Payer: Self-pay | Admitting: Medical Oncology

## 2020-09-30 ENCOUNTER — Other Ambulatory Visit: Payer: Self-pay

## 2020-09-30 ENCOUNTER — Inpatient Hospital Stay (HOSPITAL_BASED_OUTPATIENT_CLINIC_OR_DEPARTMENT_OTHER): Payer: Medicare Other | Admitting: Internal Medicine

## 2020-09-30 ENCOUNTER — Encounter: Payer: Self-pay | Admitting: Internal Medicine

## 2020-09-30 ENCOUNTER — Telehealth: Payer: Self-pay | Admitting: Medical Oncology

## 2020-09-30 ENCOUNTER — Inpatient Hospital Stay: Payer: Medicare Other

## 2020-09-30 VITALS — BP 130/53 | HR 64 | Temp 97.3°F | Resp 18 | Ht 67.0 in | Wt 243.9 lb

## 2020-09-30 DIAGNOSIS — R4020 Unspecified coma: Secondary | ICD-10-CM | POA: Diagnosis not present

## 2020-09-30 DIAGNOSIS — Z951 Presence of aortocoronary bypass graft: Secondary | ICD-10-CM | POA: Diagnosis not present

## 2020-09-30 DIAGNOSIS — R0902 Hypoxemia: Secondary | ICD-10-CM | POA: Diagnosis not present

## 2020-09-30 DIAGNOSIS — Z5111 Encounter for antineoplastic chemotherapy: Secondary | ICD-10-CM | POA: Diagnosis not present

## 2020-09-30 DIAGNOSIS — M6282 Rhabdomyolysis: Secondary | ICD-10-CM | POA: Diagnosis not present

## 2020-09-30 DIAGNOSIS — R55 Syncope and collapse: Secondary | ICD-10-CM | POA: Diagnosis not present

## 2020-09-30 DIAGNOSIS — N179 Acute kidney failure, unspecified: Secondary | ICD-10-CM | POA: Diagnosis not present

## 2020-09-30 DIAGNOSIS — D62 Acute posthemorrhagic anemia: Secondary | ICD-10-CM | POA: Diagnosis not present

## 2020-09-30 DIAGNOSIS — K2980 Duodenitis without bleeding: Secondary | ICD-10-CM | POA: Diagnosis not present

## 2020-09-30 DIAGNOSIS — N170 Acute kidney failure with tubular necrosis: Secondary | ICD-10-CM | POA: Diagnosis not present

## 2020-09-30 DIAGNOSIS — C3491 Malignant neoplasm of unspecified part of right bronchus or lung: Secondary | ICD-10-CM

## 2020-09-30 DIAGNOSIS — Z5112 Encounter for antineoplastic immunotherapy: Secondary | ICD-10-CM | POA: Diagnosis not present

## 2020-09-30 DIAGNOSIS — K921 Melena: Secondary | ICD-10-CM | POA: Diagnosis not present

## 2020-09-30 DIAGNOSIS — K222 Esophageal obstruction: Secondary | ICD-10-CM | POA: Diagnosis not present

## 2020-09-30 DIAGNOSIS — Z955 Presence of coronary angioplasty implant and graft: Secondary | ICD-10-CM | POA: Diagnosis not present

## 2020-09-30 DIAGNOSIS — E139 Other specified diabetes mellitus without complications: Secondary | ICD-10-CM | POA: Diagnosis not present

## 2020-09-30 DIAGNOSIS — C3490 Malignant neoplasm of unspecified part of unspecified bronchus or lung: Secondary | ICD-10-CM | POA: Diagnosis not present

## 2020-09-30 DIAGNOSIS — I5032 Chronic diastolic (congestive) heart failure: Secondary | ICD-10-CM | POA: Diagnosis not present

## 2020-09-30 DIAGNOSIS — I130 Hypertensive heart and chronic kidney disease with heart failure and stage 1 through stage 4 chronic kidney disease, or unspecified chronic kidney disease: Secondary | ICD-10-CM | POA: Diagnosis not present

## 2020-09-30 DIAGNOSIS — E118 Type 2 diabetes mellitus with unspecified complications: Secondary | ICD-10-CM | POA: Diagnosis not present

## 2020-09-30 DIAGNOSIS — R110 Nausea: Secondary | ICD-10-CM | POA: Diagnosis not present

## 2020-09-30 DIAGNOSIS — K3189 Other diseases of stomach and duodenum: Secondary | ICD-10-CM | POA: Diagnosis not present

## 2020-09-30 DIAGNOSIS — Z794 Long term (current) use of insulin: Secondary | ICD-10-CM | POA: Diagnosis not present

## 2020-09-30 DIAGNOSIS — K315 Obstruction of duodenum: Secondary | ICD-10-CM | POA: Diagnosis not present

## 2020-09-30 DIAGNOSIS — E1042 Type 1 diabetes mellitus with diabetic polyneuropathy: Secondary | ICD-10-CM | POA: Diagnosis present

## 2020-09-30 DIAGNOSIS — I350 Nonrheumatic aortic (valve) stenosis: Secondary | ICD-10-CM | POA: Diagnosis not present

## 2020-09-30 DIAGNOSIS — I10 Essential (primary) hypertension: Secondary | ICD-10-CM | POA: Diagnosis not present

## 2020-09-30 DIAGNOSIS — E860 Dehydration: Secondary | ICD-10-CM | POA: Diagnosis not present

## 2020-09-30 DIAGNOSIS — E1065 Type 1 diabetes mellitus with hyperglycemia: Secondary | ICD-10-CM | POA: Diagnosis present

## 2020-09-30 DIAGNOSIS — E870 Hyperosmolality and hypernatremia: Secondary | ICD-10-CM | POA: Diagnosis not present

## 2020-09-30 DIAGNOSIS — K449 Diaphragmatic hernia without obstruction or gangrene: Secondary | ICD-10-CM | POA: Diagnosis not present

## 2020-09-30 DIAGNOSIS — D6859 Other primary thrombophilia: Secondary | ICD-10-CM | POA: Diagnosis not present

## 2020-09-30 DIAGNOSIS — Z9861 Coronary angioplasty status: Secondary | ICD-10-CM | POA: Diagnosis not present

## 2020-09-30 DIAGNOSIS — R531 Weakness: Secondary | ICD-10-CM | POA: Diagnosis not present

## 2020-09-30 DIAGNOSIS — E1165 Type 2 diabetes mellitus with hyperglycemia: Secondary | ICD-10-CM | POA: Diagnosis not present

## 2020-09-30 DIAGNOSIS — E1022 Type 1 diabetes mellitus with diabetic chronic kidney disease: Secondary | ICD-10-CM | POA: Diagnosis present

## 2020-09-30 DIAGNOSIS — I2510 Atherosclerotic heart disease of native coronary artery without angina pectoris: Secondary | ICD-10-CM | POA: Diagnosis not present

## 2020-09-30 DIAGNOSIS — E039 Hypothyroidism, unspecified: Secondary | ICD-10-CM | POA: Diagnosis present

## 2020-09-30 DIAGNOSIS — K2091 Esophagitis, unspecified with bleeding: Secondary | ICD-10-CM | POA: Diagnosis not present

## 2020-09-30 DIAGNOSIS — D61818 Other pancytopenia: Secondary | ICD-10-CM | POA: Diagnosis not present

## 2020-09-30 DIAGNOSIS — N1831 Chronic kidney disease, stage 3a: Secondary | ICD-10-CM | POA: Diagnosis present

## 2020-09-30 DIAGNOSIS — Z20822 Contact with and (suspected) exposure to covid-19: Secondary | ICD-10-CM | POA: Diagnosis not present

## 2020-09-30 DIAGNOSIS — Z95828 Presence of other vascular implants and grafts: Secondary | ICD-10-CM

## 2020-09-30 DIAGNOSIS — W1830XA Fall on same level, unspecified, initial encounter: Secondary | ICD-10-CM | POA: Diagnosis present

## 2020-09-30 LAB — CMP (CANCER CENTER ONLY)
ALT: 13 U/L (ref 0–44)
AST: 17 U/L (ref 15–41)
Albumin: 2.6 g/dL — ABNORMAL LOW (ref 3.5–5.0)
Alkaline Phosphatase: 95 U/L (ref 38–126)
Anion gap: 7 (ref 5–15)
BUN: 15 mg/dL (ref 8–23)
CO2: 31 mmol/L (ref 22–32)
Calcium: 9.2 mg/dL (ref 8.9–10.3)
Chloride: 104 mmol/L (ref 98–111)
Creatinine: 1.38 mg/dL — ABNORMAL HIGH (ref 0.61–1.24)
GFR, Estimated: 48 mL/min — ABNORMAL LOW (ref >60)
Glucose, Bld: 103 mg/dL — ABNORMAL HIGH (ref 70–99)
Potassium: 3.8 mmol/L (ref 3.5–5.1)
Sodium: 142 mmol/L (ref 135–145)
Total Bilirubin: 0.3 mg/dL (ref 0.3–1.2)
Total Protein: 6.1 g/dL — ABNORMAL LOW (ref 6.5–8.1)

## 2020-09-30 LAB — CBC WITH DIFFERENTIAL (CANCER CENTER ONLY)
Abs Immature Granulocytes: 1.07 10*3/uL — ABNORMAL HIGH (ref 0.00–0.07)
Basophils Absolute: 0 10*3/uL (ref 0.0–0.1)
Basophils Relative: 1 %
Eosinophils Absolute: 0 10*3/uL (ref 0.0–0.5)
Eosinophils Relative: 0 %
HCT: 36.4 % — ABNORMAL LOW (ref 39.0–52.0)
Hemoglobin: 11.7 g/dL — ABNORMAL LOW (ref 13.0–17.0)
Immature Granulocytes: 13 %
Lymphocytes Relative: 17 %
Lymphs Abs: 1.4 10*3/uL (ref 0.7–4.0)
MCH: 29.1 pg (ref 26.0–34.0)
MCHC: 32.1 g/dL (ref 30.0–36.0)
MCV: 90.5 fL (ref 80.0–100.0)
Monocytes Absolute: 1.4 10*3/uL — ABNORMAL HIGH (ref 0.1–1.0)
Monocytes Relative: 18 %
Neutro Abs: 4.2 10*3/uL (ref 1.7–7.7)
Neutrophils Relative %: 51 %
Platelet Count: 221 10*3/uL (ref 150–400)
RBC: 4.02 MIL/uL — ABNORMAL LOW (ref 4.22–5.81)
RDW: 15.2 % (ref 11.5–15.5)
WBC Count: 8.2 10*3/uL (ref 4.0–10.5)
nRBC: 0 % (ref 0.0–0.2)

## 2020-09-30 LAB — TSH: TSH: 2.933 u[IU]/mL (ref 0.320–4.118)

## 2020-09-30 MED ORDER — SODIUM CHLORIDE 0.9 % IV SOLN
Freq: Once | INTRAVENOUS | Status: AC
  Filled 2020-09-30: qty 250

## 2020-09-30 MED ORDER — LIDOCAINE-PRILOCAINE 2.5-2.5 % EX CREA: 1.0000 "application " | TOPICAL_CREAM | CUTANEOUS | 0 refills | Status: DC | PRN

## 2020-09-30 MED ORDER — PALONOSETRON HCL INJECTION 0.25 MG/5ML
0.2500 mg | Freq: Once | INTRAVENOUS | Status: AC
  Administered 2020-09-30: 0.25 mg via INTRAVENOUS

## 2020-09-30 MED ORDER — SODIUM CHLORIDE 0.9 % IV SOLN
10.0000 mg | Freq: Once | INTRAVENOUS | Status: AC
  Administered 2020-09-30: 10 mg via INTRAVENOUS
  Filled 2020-09-30: qty 10

## 2020-09-30 MED ORDER — PALONOSETRON HCL INJECTION 0.25 MG/5ML
INTRAVENOUS | Status: AC
  Filled 2020-09-30: qty 5

## 2020-09-30 MED ORDER — SODIUM CHLORIDE 0.9 % IV SOLN
447.5000 mg | Freq: Once | INTRAVENOUS | Status: AC
  Administered 2020-09-30: 450 mg via INTRAVENOUS
  Filled 2020-09-30: qty 45

## 2020-09-30 MED ORDER — SODIUM CHLORIDE 0.9 % IV SOLN
500.0000 mg/m2 | Freq: Once | INTRAVENOUS | Status: AC
  Administered 2020-09-30: 1100 mg via INTRAVENOUS
  Filled 2020-09-30: qty 40

## 2020-09-30 MED ORDER — SODIUM CHLORIDE 0.9 % IV SOLN
200.0000 mg | Freq: Once | INTRAVENOUS | Status: AC
  Administered 2020-09-30: 200 mg via INTRAVENOUS
  Filled 2020-09-30: qty 8

## 2020-09-30 MED ORDER — SODIUM CHLORIDE 0.9 % IV SOLN
150.0000 mg | Freq: Once | INTRAVENOUS | Status: AC
  Administered 2020-09-30: 150 mg via INTRAVENOUS
  Filled 2020-09-30: qty 150

## 2020-09-30 NOTE — Patient Instructions (Signed)
[facility] Discharge Instructions for Patients Receiving Chemotherapy  Today you received the following chemotherapy agents: pembrolizumab/pemetrexed/carboplatin.   To help prevent nausea and vomiting after your treatment, we encourage you to take your nausea medication as directed.   If you develop nausea and vomiting that is not controlled by your nausea medication, call the clinic.   BELOW ARE SYMPTOMS THAT SHOULD BE REPORTED IMMEDIATELY:  *FEVER GREATER THAN 100.5 F  *CHILLS WITH OR WITHOUT FEVER  NAUSEA AND VOMITING THAT IS NOT CONTROLLED WITH YOUR NAUSEA MEDICATION  *UNUSUAL SHORTNESS OF BREATH  *UNUSUAL BRUISING OR BLEEDING  TENDERNESS IN MOUTH AND THROAT WITH OR WITHOUT PRESENCE OF ULCERS  *URINARY PROBLEMS  *BOWEL PROBLEMS  UNUSUAL RASH Items with * indicate a potential emergency and should be followed up as soon as possible.  Feel free to call the clinic should you have any questions or concerns. The clinic phone number is [phone].  Please show the [place] at check-in to the Emergency Department and triage nurse.  Pembrolizumab injection What is this medicine? PEMBROLIZUMAB (pem broe liz ue mab) is a monoclonal antibody. It is used to treat certain types of cancer. This medicine may be used for other purposes; ask your health care provider or pharmacist if you have questions. COMMON BRAND NAME(S): Keytruda What should I tell my health care provider before I take this medicine? They need to know if you have any of these conditions:  diabetes  immune system problems  inflammatory bowel disease  liver disease  lung or breathing disease  lupus  received or scheduled to receive an organ transplant or a stem-cell transplant that uses donor stem cells  an unusual or allergic reaction to pembrolizumab, other medicines, foods, dyes, or preservatives  pregnant or trying to get pregnant  breast-feeding How should I use this  medicine? This medicine is for infusion into a vein. It is given by a health care professional in a hospital or clinic setting. A special MedGuide will be given to you before each treatment. Be sure to read this information carefully each time. Talk to your pediatrician regarding the use of this medicine in children. While this drug may be prescribed for children as young as 6 months for selected conditions, precautions do apply. Overdosage: If you think you have taken too much of this medicine contact a poison control center or emergency room at once. NOTE: This medicine is only for you. Do not share this medicine with others. What if I miss a dose? It is important not to miss your dose. Call your doctor or health care professional if you are unable to keep an appointment. What may interact with this medicine? Interactions have not been studied. Give your health care provider a list of all the medicines, herbs, non-prescription drugs, or dietary supplements you use. Also tell them if you smoke, drink alcohol, or use illegal drugs. Some items may interact with your medicine. This list may not describe all possible interactions. Give your health care provider a list of all the medicines, herbs, non-prescription drugs, or dietary supplements you use. Also tell them if you smoke, drink alcohol, or use illegal drugs. Some items may interact with your medicine. What should I watch for while using this medicine? Your condition will be monitored carefully while you are receiving this medicine. You may need blood work done while you are taking this medicine. Do not become pregnant while taking this medicine or for 4 months after stopping it. Women  should inform their doctor if they wish to become pregnant or think they might be pregnant. There is a potential for serious side effects to an unborn child. Talk to your health care professional or pharmacist for more information. Do not breast-feed an infant while  taking this medicine or for 4 months after the last dose. What side effects may I notice from receiving this medicine? Side effects that you should report to your doctor or health care professional as soon as possible:  allergic reactions like skin rash, itching or hives, swelling of the face, lips, or tongue  bloody or black, tarry  breathing problems  changes in vision  chest pain  chills  confusion  constipation  cough  diarrhea  dizziness or feeling faint or lightheaded  fast or irregular heartbeat  fever  flushing  joint pain  low blood counts - this medicine may decrease the number of white blood cells, red blood cells and platelets. You may be at increased risk for infections and bleeding.  muscle pain  muscle weakness  pain, tingling, numbness in the hands or feet  persistent headache  redness, blistering, peeling or loosening of the skin, including inside the mouth  signs and symptoms of high blood sugar such as dizziness; dry mouth; dry skin; fruity breath; nausea; stomach pain; increased hunger or thirst; increased urination  signs and symptoms of kidney injury like trouble passing urine or change in the amount of urine  signs and symptoms of liver injury like dark urine, light-colored stools, loss of appetite, nausea, right upper belly pain, yellowing of the eyes or skin  sweating  swollen lymph nodes  weight loss Side effects that usually do not require medical attention (report to your doctor or health care professional if they continue or are bothersome):  decreased appetite  hair loss  muscle pain  tiredness This list may not describe all possible side effects. Call your doctor for medical advice about side effects. You may report side effects to FDA at 1-800-FDA-1088. Where should I keep my medicine? This drug is given in a hospital or clinic and will not be stored at home. NOTE: This sheet is a summary. It may not cover all  possible information. If you have questions about this medicine, talk to your doctor, pharmacist, or health care provider.  2020 Elsevier/Gold Standard (2019-10-06 18:07:58)  Pemetrexed injection What is this medicine? PEMETREXED (PEM e TREX ed) is a chemotherapy drug used to treat lung cancers like non-small cell lung cancer and mesothelioma. It may also be used to treat other cancers. This medicine may be used for other purposes; ask your health care provider or pharmacist if you have questions. COMMON BRAND NAME(S): Alimta What should I tell my health care provider before I take this medicine? They need to know if you have any of these conditions:  infection (especially a virus infection such as chickenpox, cold sores, or herpes)  kidney disease  low blood counts, like low white cell, platelet, or red cell counts  lung or breathing disease, like asthma  radiation therapy  an unusual or allergic reaction to pemetrexed, other medicines, foods, dyes, or preservative  pregnant or trying to get pregnant  breast-feeding How should I use this medicine? This drug is given as an infusion into a vein. It is administered in a hospital or clinic by a specially trained health care professional. Talk to your pediatrician regarding the use of this medicine in children. Special care may be needed. Overdosage: If you  think you have taken too much of this medicine contact a poison control center or emergency room at once. NOTE: This medicine is only for you. Do not share this medicine with others. What if I miss a dose? It is important not to miss your dose. Call your doctor or health care professional if you are unable to keep an appointment. What may interact with this medicine? This medicine may interact with the following medications:  Ibuprofen This list may not describe all possible interactions. Give your health care provider a list of all the medicines, herbs, non-prescription drugs,  or dietary supplements you use. Also tell them if you smoke, drink alcohol, or use illegal drugs. Some items may interact with your medicine. What should I watch for while using this medicine? Visit your doctor for checks on your progress. This drug may make you feel generally unwell. This is not uncommon, as chemotherapy can affect healthy cells as well as cancer cells. Report any side effects. Continue your course of treatment even though you feel ill unless your doctor tells you to stop. In some cases, you may be given additional medicines to help with side effects. Follow all directions for their use. Call your doctor or health care professional for advice if you get a fever, chills or sore throat, or other symptoms of a cold or flu. Do not treat yourself. This drug decreases your body's ability to fight infections. Try to avoid being around people who are sick. This medicine may increase your risk to bruise or bleed. Call your doctor or health care professional if you notice any unusual bleeding. Be careful brushing and flossing your teeth or using a toothpick because you may get an infection or bleed more easily. If you have any dental work done, tell your dentist you are receiving this medicine. Avoid taking products that contain aspirin, acetaminophen, ibuprofen, naproxen, or ketoprofen unless instructed by your doctor. These medicines may hide a fever. Call your doctor or health care professional if you get diarrhea or mouth sores. Do not treat yourself. To protect your kidneys, drink water or other fluids as directed while you are taking this medicine. Do not become pregnant while taking this medicine or for 6 months after stopping it. Women should inform their doctor if they wish to become pregnant or think they might be pregnant. Men should not father a child while taking this medicine and for 3 months after stopping it. This may interfere with the ability to father a child. You should talk to  your doctor or health care professional if you are concerned about your fertility. There is a potential for serious side effects to an unborn child. Talk to your health care professional or pharmacist for more information. Do not breast-feed an infant while taking this medicine or for 1 week after stopping it. What side effects may I notice from receiving this medicine? Side effects that you should report to your doctor or health care professional as soon as possible:  allergic reactions like skin rash, itching or hives, swelling of the face, lips, or tongue  breathing problems  redness, blistering, peeling or loosening of the skin, including inside the mouth  signs and symptoms of bleeding such as bloody or black, tarry stools; red or dark-brown urine; spitting up blood or brown material that looks like coffee grounds; red spots on the skin; unusual bruising or bleeding from the eye, gums, or nose  signs and symptoms of infection like fever or chills; cough; sore  throat; pain or trouble passing urine  signs and symptoms of kidney injury like trouble passing urine or change in the amount of urine  signs and symptoms of liver injury like dark yellow or brown urine; general ill feeling or flu-like symptoms; light-colored stools; loss of appetite; nausea; right upper belly pain; unusually weak or tired; yellowing of the eyes or skin Side effects that usually do not require medical attention (report to your doctor or health care professional if they continue or are bothersome):  constipation  mouth sores  nausea, vomiting  unusually weak or tired This list may not describe all possible side effects. Call your doctor for medical advice about side effects. You may report side effects to FDA at 1-800-FDA-1088. Where should I keep my medicine? This drug is given in a hospital or clinic and will not be stored at home. NOTE: This sheet is a summary. It may not cover all possible information. If  you have questions about this medicine, talk to your doctor, pharmacist, or health care provider.  2020 Elsevier/Gold Standard (2018-01-19 16:11:33)  Carboplatin injection What is this medicine? CARBOPLATIN (KAR boe pla tin) is a chemotherapy drug. It targets fast dividing cells, like cancer cells, and causes these cells to die. This medicine is used to treat ovarian cancer and many other cancers. This medicine may be used for other purposes; ask your health care provider or pharmacist if you have questions. COMMON BRAND NAME(S): Paraplatin What should I tell my health care provider before I take this medicine? They need to know if you have any of these conditions:  blood disorders  hearing problems  kidney disease  recent or ongoing radiation therapy  an unusual or allergic reaction to carboplatin, cisplatin, other chemotherapy, other medicines, foods, dyes, or preservatives  pregnant or trying to get pregnant  breast-feeding How should I use this medicine? This drug is usually given as an infusion into a vein. It is administered in a hospital or clinic by a specially trained health care professional. Talk to your pediatrician regarding the use of this medicine in children. Special care may be needed. Overdosage: If you think you have taken too much of this medicine contact a poison control center or emergency room at once. NOTE: This medicine is only for you. Do not share this medicine with others. What if I miss a dose? It is important not to miss a dose. Call your doctor or health care professional if you are unable to keep an appointment. What may interact with this medicine?  medicines for seizures  medicines to increase blood counts like filgrastim, pegfilgrastim, sargramostim  some antibiotics like amikacin, gentamicin, neomycin, streptomycin, tobramycin  vaccines Talk to your doctor or health care professional before taking any of these  medicines:  acetaminophen  aspirin  ibuprofen  ketoprofen  naproxen This list may not describe all possible interactions. Give your health care provider a list of all the medicines, herbs, non-prescription drugs, or dietary supplements you use. Also tell them if you smoke, drink alcohol, or use illegal drugs. Some items may interact with your medicine. What should I watch for while using this medicine? Your condition will be monitored carefully while you are receiving this medicine. You will need important blood work done while you are taking this medicine. This drug may make you feel generally unwell. This is not uncommon, as chemotherapy can affect healthy cells as well as cancer cells. Report any side effects. Continue your course of treatment even though you feel  ill unless your doctor tells you to stop. In some cases, you may be given additional medicines to help with side effects. Follow all directions for their use. Call your doctor or health care professional for advice if you get a fever, chills or sore throat, or other symptoms of a cold or flu. Do not treat yourself. This drug decreases your body's ability to fight infections. Try to avoid being around people who are sick. This medicine may increase your risk to bruise or bleed. Call your doctor or health care professional if you notice any unusual bleeding. Be careful brushing and flossing your teeth or using a toothpick because you may get an infection or bleed more easily. If you have any dental work done, tell your dentist you are receiving this medicine. Avoid taking products that contain aspirin, acetaminophen, ibuprofen, naproxen, or ketoprofen unless instructed by your doctor. These medicines may hide a fever. Do not become pregnant while taking this medicine. Women should inform their doctor if they wish to become pregnant or think they might be pregnant. There is a potential for serious side effects to an unborn child. Talk  to your health care professional or pharmacist for more information. Do not breast-feed an infant while taking this medicine. What side effects may I notice from receiving this medicine? Side effects that you should report to your doctor or health care professional as soon as possible:  allergic reactions like skin rash, itching or hives, swelling of the face, lips, or tongue  signs of infection - fever or chills, cough, sore throat, pain or difficulty passing urine  signs of decreased platelets or bleeding - bruising, pinpoint red spots on the skin, black, tarry stools, nosebleeds  signs of decreased red blood cells - unusually weak or tired, fainting spells, lightheadedness  breathing problems  changes in hearing  changes in vision  chest pain  high blood pressure  low blood counts - This drug may decrease the number of white blood cells, red blood cells and platelets. You may be at increased risk for infections and bleeding.  nausea and vomiting  pain, swelling, redness or irritation at the injection site  pain, tingling, numbness in the hands or feet  problems with balance, talking, walking  trouble passing urine or change in the amount of urine Side effects that usually do not require medical attention (report to your doctor or health care professional if they continue or are bothersome):  hair loss  loss of appetite  metallic taste in the mouth or changes in taste This list may not describe all possible side effects. Call your doctor for medical advice about side effects. You may report side effects to FDA at 1-800-FDA-1088. Where should I keep my medicine? This drug is given in a hospital or clinic and will not be stored at home. NOTE: This sheet is a summary. It may not cover all possible information. If you have questions about this medicine, talk to your doctor, pharmacist, or health care provider.  2020 Elsevier/Gold Standard (2008-03-06 14:38:05)

## 2020-09-30 NOTE — Telephone Encounter (Signed)
Reviewed port a cath pre procedure instructions to wife who voiced understanding. Emla cream sent to pharmacy.

## 2020-09-30 NOTE — Progress Notes (Signed)
Per Dr Julien Nordmann ,it is okay to treat pt today with ALimta , [place] an dcarbolplatin and creatinine of 1.38.

## 2020-09-30 NOTE — Progress Notes (Signed)
[place] [phone]   [phone]  OFFICE PROGRESS NOTE  [name], MD [address] [place] [address]  DIAGNOSIS: Stage IV (T4, N2, M1 a) non-small cell lung cancer, mucinous adenocarcinoma presented with bilateral large masses in the left upper lobe as well as the right lower lobe in addition to other small nodules in the right upper lobe with hilar and mediastinal lymphadenopathy diagnosed in August 2021.  Biomarker Findings Microsatellite status - MS-Stable Tumor Mutational Burden - 4 Muts/Mb Genomic Findings For a complete list of the genes assayed, please refer to the Appendix. KRAS G12C GNAS R201H 7 Disease relevant genes with no reportable alterations: ALK, BRAF, EGFR, ERBB2, MET, RET, ROS1  PDL1 Expression: 20%.  PRIOR THERAPY: None  CURRENT THERAPY: Systemic chemotherapy with carboplatin for AUC of 5, Alimta 500 mg/M2 and Keytruda 200 mg IV every 3 weeks.  First dose 09/09/2020.  Status post 1 cycle.   INTERVAL HISTORY: Randall Wilkins 79 y.o. male returns to the clinic today for follow-up visit accompanied by his wife.  The patient is feeling fine today with no concerning complaints.  He was seen at the emergency department 10 days ago.  He had abdominal pain and few episodes of diarrhea and CT scan of the abdomen showed duodenitis.  The patient started feeling better with no concerning findings over the last week.  He denied having any current chest pain, shortness of breath, cough or hemoptysis.  He denied having any nausea, vomiting, diarrhea or constipation.  He has no headache or visual changes.  He is here today for evaluation before starting cycle #2.  MEDICAL HISTORY: Past Medical History:  Diagnosis Date  . Aortic stenosis, mild 11/14/2013  . Aortic valve sclerosis 03/29/2015  . Bilateral leg edema 05/21/2014  . CAD (coronary artery disease)   . Chronic diastolic congestive heart failure ([place])   .  Chronic kidney disease (CKD), stage III (moderate) ([facility])   . Diabetes 1.5, managed as type 1 ([place]) 02/04/2013  . Diabetic peripheral neuropathy associated with type 1 diabetes mellitus ([place]) 11/14/2013  . Dysphagia   . Dyspnea on exertion 03/21/2015  . Exogenous obesity   . Gout   . Heart attack ([place])   . History of nuclear stress test 07/2011   dipyridamole; fixed inferolateral defect, worse at stress than rest; no reversible ischemia; low risk scan   . Hyperlipidemia   . Hypertension   . Hypothyroidism   . Insulin dependent diabetes mellitus   . Left foot drop   . Left main coronary artery disease 03/28/2015  . Memory loss   . Obesity (BMI 30-39.9) 11/14/2013  . Obstructive sleep apnea 03/21/2015  . OSA on CPAP    uses a cpap  . Peptic ulcer with hemorrhage 03/28/2015  . Peripheral neuropathy   . Pneumonia   . Rhabdomyolysis   . 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 Stroke ([place])    L patietal with small scattered lacunar infarcts  . Thrombocytopenia ([place]) 03/21/2015  . Venous insufficiency   . Weakness generalized 03/21/2015    ALLERGIES:  is allergic to actos [pioglitazone], metformin and related, and niaspan [niacin er].  MEDICATIONS:  Current Outpatient Medications  Medication Sig Dispense Refill  . albuterol (VENTOLIN HFA) 108 (90 Base) MCG/ACT inhaler Inhale 2 puffs into the lungs every 6 (six) hours as needed for wheezing or shortness of  breath. 8 g 6  . allopurinol (ZYLOPRIM) 300 MG tablet Take 300 mg by mouth daily.   2  . amitriptyline (ELAVIL) 50 MG tablet Take 50 mg by mouth at bedtime.     Marland Kitchen apixaban (ELIQUIS) 5 MG TABS tablet Take 1 tablet (5 mg total) by mouth 2 (two) times daily. You may restart this medication on Thursday, 08/08/2020. (Patient taking differently: Take 5 mg by mouth 2 (two) times daily. ) 60 tablet 11  . aspirin 81 MG tablet Take 1 tablet (81 mg total) by mouth daily.    . Blood Glucose Monitoring Suppl (CONTOUR  NEXT MONITOR) w/Device KIT 1 Device by Does not apply route 3 (three) times daily. Use to check blood sugars 3 times daily. Dx Code E13.9 1 kit 2  . CONTOUR NEXT TEST test strip USE 1 STRIP TO CHECK GLUCOSE 4 TIMES DAILY 150 each 0  . donepezil (ARICEPT) 10 MG tablet TAKE ONE TABLET DAILY.  PLEASE CALL [phone] TO SCHEDULE FOLLOW UP. (Patient taking differently: Take 10 mg by mouth daily. ) 90 tablet 0  . folic acid (FOLVITE) 1 MG tablet Take 1 tablet (1 mg total) by mouth daily. 30 tablet 4  . furosemide (LASIX) 40 MG tablet Take 80 mg by mouth in the morning.     . insulin degludec (TRESIBA FLEXTOUCH) 200 UNIT/ML FlexTouch Pen Inject 46 Units into the skin at bedtime.    . insulin lispro (HUMALOG) 100 UNIT/ML cartridge Inject 5-15 Units into the skin 3 (three) times daily with meals. Sliding scale.    . Insulin Pen Needle (EASY COMFORT PEN NEEDLES) 33G X 4 MM MISC 1 each by Does not apply route See admin instructions. Use to inject insulin 5 times daily. 150 each 3  . levothyroxine (SYNTHROID, LEVOTHROID) 75 MCG tablet Take 75 mcg by mouth daily before breakfast.   1  . lidocaine (XYLOCAINE) 2 % solution Use as directed 15 mLs in the mouth or throat as needed for mouth pain. 100 mL 0  . liraglutide (VICTOZA) 18 MG/3ML SOPN Inject 1.2 mg into the skin daily.     Marland Kitchen lisinopril (ZESTRIL) 10 MG tablet TAKE ONE TABLET DAILY (Patient taking differently: Take 10 mg by mouth daily. ) 90 tablet 2  . metFORMIN (GLUCOPHAGE-XR) 750 MG 24 hr tablet TAKE 1 TABLET WITH DINNER (Patient taking differently: Take 750 mg by mouth daily. ) 90 tablet 0  . metoprolol succinate (TOPROL-XL) 25 MG 24 hr tablet Take 1 tablet (25 mg total) by mouth daily. 90 tablet 3  . NON FORMULARY Inhale 1 application into the lungs at bedtime. CPAP    . potassium chloride SA (K-DUR,KLOR-CON) 20 MEQ tablet Take 20 mEq by mouth 2 (two) times daily.     . prochlorperazine (COMPAZINE) 10 MG tablet Take 1 tablet (10 mg total) by mouth every  6 (six) hours as needed for nausea or vomiting. 30 tablet 0  . rosuvastatin (CRESTOR) 20 MG tablet Take 20 mg by mouth daily.    . Tamsulosin HCl (FLOMAX) 0.4 MG CAPS Take 0.4 mg by mouth daily.     Marland Kitchen torsemide (DEMADEX) 10 MG tablet Take 10 mg by mouth every evening.     . vitamin B-12 (CYANOCOBALAMIN) 1000 MCG tablet Take 1,000 mcg by mouth daily.     No current facility-administered medications for this visit.    SURGICAL HISTORY:  Past Surgical History:  Procedure Laterality Date  . BACK SURGERY  2002   lumbosacral. 11 back surgeries  total  . BRONCHIAL BIOPSY  08/06/2020   Procedure: BRONCHIAL BIOPSIES;  Surgeon: Collene Gobble, MD;  Location: [facility] ENDOSCOPY;  Service: Pulmonary;;  . BRONCHIAL BRUSHINGS  08/06/2020   Procedure: BRONCHIAL BRUSHINGS;  Surgeon: Collene Gobble, MD;  Location: [facility] ENDOSCOPY;  Service: Pulmonary;;  . BRONCHIAL NEEDLE ASPIRATION BIOPSY  08/06/2020   Procedure: BRONCHIAL NEEDLE ASPIRATION BIOPSIES;  Surgeon: Collene Gobble, MD;  Location: [facility] ENDOSCOPY;  Service: Pulmonary;;  . Carotid Doppler  03/2013   bilat bulb/prox ICAs - mild amount of fibrous plaque with no evidence of diameter reduction  . CARPAL TUNNEL RELEASE Bilateral 08/09/2014   Procedure: BILATERAL CARPAL TUNNEL RELEASE;  Surgeon: Daryll Brod, MD;  Location: [place];  Service: Orthopedics;  Laterality: Bilateral;  ANESTHESIA:  IV REGIONAL BIL FAB  . CHOLECYSTECTOMY    . COLONOSCOPY    . CORONARY ANGIOPLASTY  10/13/1996  . CORONARY ANGIOPLASTY  09/21/1989   emergency PTCA  . CORONARY ANGIOPLASTY  10/13/1996   Multi-Link diagonal & OD stenting (Dr. Marella Chimes)  . CORONARY ANGIOPLASTY  12/03/1997   disease of mid DX-1 ~50% & in mid PLA & PDA (distal lesions) (Dr. Marella Chimes)   . CORONARY ANGIOPLASTY  10/14/1999   progression of disease distal PLA & PDA; progression of disease prox RCA - moderate (Dr. Marella Chimes)   . CORONARY ANGIOPLASTY WITH STENT PLACEMENT  04/04/2004    4.0x9mm non-DES (thrombectomy via AngioJet) to RCA for high grade stenosis (Dr. Marella Chimes)  . CORONARY ARTERY BYPASS GRAFT N/A 03/29/2015   Procedure: CORONARY ARTERY BYPASS GRAFTING TIMES TWO USING LEFT INTERNAL MAMMARY ARTERY AND RIGHT LEG GREATER SAPHENOUS VEIN HARVESTED ENDOSCOPICALLY.;  Surgeon: Rexene Alberts, MD;  Location: [place];  Service: Open Heart Surgery;  Laterality: N/A;  . ESOPHAGOGASTRODUODENOSCOPY N/A 03/27/2015   Procedure: ESOPHAGOGASTRODUODENOSCOPY (EGD);  Surgeon: Clarene Essex, MD;  Location[facility] ENDOSCOPY;  Service: Endoscopy;  Laterality: N/A;  possible dilation  . FINE NEEDLE ASPIRATION  08/06/2020   Procedure: FINE NEEDLE ASPIRATION (FNA) LINEAR;  Surgeon: Collene Gobble, MD;  Location: [facility] ENDOSCOPY;  Service: Pulmonary;;  . LEFT HEART CATHETERIZATION WITH CORONARY ANGIOGRAM N/A 03/28/2015   Procedure: LEFT HEART CATHETERIZATION WITH CORONARY ANGIOGRAM;  Surgeon: Peter M Martinique, MD;  Location: [facility] CATH LAB;  Service: Cardiovascular;  Laterality: N/A;  . SINUS ENDO W/FUSION    . TONSILLECTOMY    . TRANSTHORACIC ECHOCARDIOGRAM  08/08/2013   EF 55-60%, mild conc hypertrophy, grade 1 diastolic dysfunction; AV with mild stenosis; LA & RA mildly dilated  . VIDEO BRONCHOSCOPY WITH ENDOBRONCHIAL NAVIGATION N/A 08/06/2020   Procedure: VIDEO BRONCHOSCOPY WITH ENDOBRONCHIAL NAVIGATION;  Surgeon: Collene Gobble, MD;  Location: [place] ENDOSCOPY;  Service: Pulmonary;  Laterality: N/A;  . VIDEO BRONCHOSCOPY WITH ENDOBRONCHIAL ULTRASOUND N/A 08/06/2020   Procedure: VIDEO BRONCHOSCOPY WITH ENDOBRONCHIAL ULTRASOUND;  Surgeon: Collene Gobble, MD;  Location: [facility] ENDOSCOPY;  Service: Pulmonary;  Laterality: N/A;    REVIEW OF SYSTEMS:  A comprehensive review of systems was negative except for: Constitutional: positive for fatigue   PHYSICAL EXAMINATION: General appearance: alert, cooperative, fatigued and no distress Head: Normocephalic, without obvious abnormality, atraumatic Neck: no adenopathy,  no JVD, supple, symmetrical, trachea midline and thyroid not enlarged, symmetric, no tenderness/mass/nodules Lymph nodes: Cervical, supraclavicular, and axillary nodes normal. Resp: clear to auscultation bilaterally Back: symmetric, no curvature. ROM normal. No CVA tenderness. Cardio: regular rate and rhythm, S1, S2 normal, no murmur, click, rub or gallop GI: soft, non-tender; bowel sounds normal; no masses,  no organomegaly Extremities: extremities normal, atraumatic, no cyanosis or edema  ECOG PERFORMANCE STATUS: 1 - Symptomatic but completely ambulatory  Blood pressure (!) 130/53, pulse 64, temperature (!) 97.3 F (36.3 C), temperature source Tympanic, resp. rate 18, height $RemoveBe'5\' 7"'[id number]$  (1.702 m), weight 243 lb 14.4 oz (110.6 kg), SpO2 93 %.  LABORATORY DATA: Lab Results  Component Value Date   WBC 8.2 09/30/2020   HGB 11.7 (L) 09/30/2020   HCT 36.4 (L) 09/30/2020   MCV 90.5 09/30/2020   PLT 221 09/30/2020      Chemistry      Component Value Date/Time   NA 141 09/23/2020 1005   K 4.1 09/23/2020 1005   CL 105 09/23/2020 1005   CO2 32 09/23/2020 1005   BUN 19 09/23/2020 1005   CREATININE 1.41 (H) 09/23/2020 1005   CREATININE 1.30 (H) 04/30/2020 1044      Component Value Date/Time   CALCIUM 8.9 09/23/2020 1005   ALKPHOS 75 09/23/2020 1005   AST 19 09/23/2020 1005   ALT 19 09/23/2020 1005   BILITOT 0.3 09/23/2020 1005       RADIOGRAPHIC STUDIES: CT ABDOMEN PELVIS W CONTRAST  Result Date: 09/19/2020 CLINICAL DATA:  History of non-small fall lung cancer, epigastric abdominal pain EXAM: CT ABDOMEN AND PELVIS WITH CONTRAST TECHNIQUE: Multidetector CT imaging of the abdomen and pelvis was performed using the standard protocol following bolus administration of intravenous contrast. CONTRAST:  166mL OMNIPAQUE IOHEXOL 300 MG/ML  SOLN COMPARISON:  Head CT August 22, 2020 FINDINGS: Lower chest: There is mild cardiomegaly. Aortic atherosclerosis seen. Again noted is a partially  visualized rounded airspace consolidation/mass in the posterior right lower lobe and left infrahilar region. A trace right pleural effusion is present. There is also a 9 mm posterior left lower lobe nodule which was seen on the prior exam. Hepatobiliary: The liver is normal in density without focal abnormality.The main portal vein is patent. The patient is status post cholecystectomy. No biliary ductal dilation. Pancreas: There is mild inflammatory changes which there seen adjacent to the pancreatic head which could be from the duodenum. No pancreatic ductal dilatation, or fluid collections. Spleen: Normal in size without focal abnormality. Adrenals/Urinary Tract: Both adrenal glands appear normal. A 3 cm low-density lesion seen off lower pole of the right kidney. Bladder is unremarkable. Stomach/Bowel: A small hiatal hernia is present. The stomach is otherwise unremarkable. There is diffuse wall thickening seen throughout the duodenum with hyperenhancement and surrounding mesenteric fat stranding changes. A possible focal diverticulum is within the second portion of the duodenum. No definite loculated fluid collections or free air however are noted. Inflammatory changes are seen tracking into the right pericolic gutter. The remainder of the small bowel is unremarkable. There is a moderate amount of stool however it is unremarkable. The appendix is unremarkable Vascular/Lymphatic: There are no enlarged mesenteric, retroperitoneal, or pelvic lymph nodes. Scattered aortic atherosclerotic calcifications are seen without aneurysmal dilatation. Reproductive: The prostate is unremarkable. Other: No evidence of abdominal wall mass or hernia. Musculoskeletal: No acute or significant osseous findings. Again noted is a lobular soft tissue mass seen overlying the left gluteal soft tissues with internal rounded calcifications. This may be due to prior injury. IMPRESSION: Findings suggestive of extensive acute duodenitis which  may be due to infectious or inflammatory process. Probable duodenal diverticulum is noted however no loculated fluid collections or free air. Mild inflammatory changes around the pancreatic head which may be due to the adjacent duodenum versus concomitant mild pancreatitis. Rounded areas of  masslike consolidation in the bilateral lungs as on the recent PET CT August 22, 2020. Stable 9 mm pulmonary nodule in the posterior left lower lung Aortic Atherosclerosis (ICD10-I70.0). Electronically Signed   By: Prudencio Pair M.D.   On: 09/19/2020 22:23    ASSESSMENT AND PLAN: This is a very pleasant 79 years old white male diagnosed with a stage IV (T4, N2, M1 a) non-small cell lung cancer, mucinous adenocarcinoma presented with bilateral large masses in the left upper lobe as well as the right upper lobe in addition to other pulmonary nodules in the right upper lobe with hilar and mediastinal lymphadenopathy diagnosed in August 2021. Molecular study showed positive KRAS G12C mutation and PD-L1 expression of 20%. The patient is currently undergoing systemic chemotherapy with carboplatin for AUC of 5, Alimta 500 mg/M2 and Keytruda 200 mg IV every 3 weeks status post 1 cycle. He tolerated the first cycle of his treatment well with no concerning adverse effect except for few episodes of diarrhea and abdominal pain that completely resolved.  The symptoms could be related to his immunotherapy. I recommended for the patient to proceed with cycle #2 today as planned. I will see him back for follow-up visit in 3 weeks for evaluation before starting cycle #3. He was advised to call immediately if he has any concerning symptoms in the interval. The patient voices understanding of current disease status and treatment options and is in agreement with the current care plan.  All questions were answered. The patient knows to call the clinic with any problems, questions or concerns. We can certainly see the patient much sooner  if necessary.  Disclaimer: This note was dictated with voice recognition software. Similar sounding words can inadvertently be transcribed and may not be corrected upon review.

## 2020-10-02 ENCOUNTER — Other Ambulatory Visit: Payer: Self-pay

## 2020-10-02 ENCOUNTER — Encounter (HOSPITAL_COMMUNITY): Payer: Self-pay | Admitting: *Deleted

## 2020-10-02 ENCOUNTER — Telehealth: Payer: Self-pay | Admitting: Medical Oncology

## 2020-10-02 ENCOUNTER — Emergency Department (HOSPITAL_COMMUNITY): Payer: Medicare Other

## 2020-10-02 ENCOUNTER — Other Ambulatory Visit: Payer: Self-pay | Admitting: Radiology

## 2020-10-02 ENCOUNTER — Inpatient Hospital Stay (HOSPITAL_COMMUNITY)
Admission: EM | Admit: 2020-10-02 | Discharge: 2020-10-08 | DRG: 673 | Disposition: A | Discharge status: Home Health | Payer: Medicare Other | Attending: Internal Medicine | Admitting: Internal Medicine

## 2020-10-02 DIAGNOSIS — D61818 Other pancytopenia: Secondary | ICD-10-CM | POA: Diagnosis present

## 2020-10-02 DIAGNOSIS — T39015A Adverse effect of aspirin, initial encounter: Secondary | ICD-10-CM | POA: Diagnosis present

## 2020-10-02 DIAGNOSIS — K921 Melena: Secondary | ICD-10-CM | POA: Diagnosis not present

## 2020-10-02 DIAGNOSIS — Z9861 Coronary angioplasty status: Secondary | ICD-10-CM | POA: Diagnosis not present

## 2020-10-02 DIAGNOSIS — K315 Obstruction of duodenum: Secondary | ICD-10-CM | POA: Diagnosis present

## 2020-10-02 DIAGNOSIS — E10649 Type 1 diabetes mellitus with hypoglycemia without coma: Secondary | ICD-10-CM | POA: Diagnosis not present

## 2020-10-02 DIAGNOSIS — E870 Hyperosmolality and hypernatremia: Secondary | ICD-10-CM | POA: Diagnosis present

## 2020-10-02 DIAGNOSIS — G4733 Obstructive sleep apnea (adult) (pediatric): Secondary | ICD-10-CM | POA: Diagnosis present

## 2020-10-02 DIAGNOSIS — K2091 Esophagitis, unspecified with bleeding: Secondary | ICD-10-CM | POA: Diagnosis not present

## 2020-10-02 DIAGNOSIS — Z87891 Personal history of nicotine dependence: Secondary | ICD-10-CM

## 2020-10-02 DIAGNOSIS — C3491 Malignant neoplasm of unspecified part of right bronchus or lung: Secondary | ICD-10-CM | POA: Diagnosis present

## 2020-10-02 DIAGNOSIS — W1830XA Fall on same level, unspecified, initial encounter: Secondary | ICD-10-CM | POA: Diagnosis present

## 2020-10-02 DIAGNOSIS — I2510 Atherosclerotic heart disease of native coronary artery without angina pectoris: Secondary | ICD-10-CM | POA: Diagnosis present

## 2020-10-02 DIAGNOSIS — Z79890 Hormone replacement therapy (postmenopausal): Secondary | ICD-10-CM

## 2020-10-02 DIAGNOSIS — E139 Other specified diabetes mellitus without complications: Secondary | ICD-10-CM | POA: Diagnosis not present

## 2020-10-02 DIAGNOSIS — R630 Anorexia: Secondary | ICD-10-CM | POA: Diagnosis present

## 2020-10-02 DIAGNOSIS — Z20822 Contact with and (suspected) exposure to covid-19: Secondary | ICD-10-CM | POA: Diagnosis present

## 2020-10-02 DIAGNOSIS — Z79899 Other long term (current) drug therapy: Secondary | ICD-10-CM

## 2020-10-02 DIAGNOSIS — Z7982 Long term (current) use of aspirin: Secondary | ICD-10-CM

## 2020-10-02 DIAGNOSIS — Z8711 Personal history of peptic ulcer disease: Secondary | ICD-10-CM

## 2020-10-02 DIAGNOSIS — E785 Hyperlipidemia, unspecified: Secondary | ICD-10-CM | POA: Diagnosis present

## 2020-10-02 DIAGNOSIS — M6282 Rhabdomyolysis: Secondary | ICD-10-CM | POA: Diagnosis present

## 2020-10-02 DIAGNOSIS — E669 Obesity, unspecified: Secondary | ICD-10-CM | POA: Diagnosis present

## 2020-10-02 DIAGNOSIS — N1831 Chronic kidney disease, stage 3a: Secondary | ICD-10-CM | POA: Diagnosis present

## 2020-10-02 DIAGNOSIS — Z5111 Encounter for antineoplastic chemotherapy: Secondary | ICD-10-CM

## 2020-10-02 DIAGNOSIS — E1065 Type 1 diabetes mellitus with hyperglycemia: Secondary | ICD-10-CM | POA: Diagnosis present

## 2020-10-02 DIAGNOSIS — I350 Nonrheumatic aortic (valve) stenosis: Secondary | ICD-10-CM

## 2020-10-02 DIAGNOSIS — Z833 Family history of diabetes mellitus: Secondary | ICD-10-CM

## 2020-10-02 DIAGNOSIS — E1042 Type 1 diabetes mellitus with diabetic polyneuropathy: Secondary | ICD-10-CM | POA: Diagnosis present

## 2020-10-02 DIAGNOSIS — K3189 Other diseases of stomach and duodenum: Secondary | ICD-10-CM | POA: Diagnosis present

## 2020-10-02 DIAGNOSIS — I358 Other nonrheumatic aortic valve disorders: Secondary | ICD-10-CM | POA: Diagnosis present

## 2020-10-02 DIAGNOSIS — Z888 Allergy status to other drugs, medicaments and biological substances status: Secondary | ICD-10-CM

## 2020-10-02 DIAGNOSIS — R0602 Shortness of breath: Secondary | ICD-10-CM | POA: Diagnosis present

## 2020-10-02 DIAGNOSIS — D72829 Elevated white blood cell count, unspecified: Secondary | ICD-10-CM | POA: Diagnosis present

## 2020-10-02 DIAGNOSIS — Z9181 History of falling: Secondary | ICD-10-CM

## 2020-10-02 DIAGNOSIS — I130 Hypertensive heart and chronic kidney disease with heart failure and stage 1 through stage 4 chronic kidney disease, or unspecified chronic kidney disease: Secondary | ICD-10-CM | POA: Diagnosis present

## 2020-10-02 DIAGNOSIS — Z955 Presence of coronary angioplasty implant and graft: Secondary | ICD-10-CM | POA: Diagnosis not present

## 2020-10-02 DIAGNOSIS — D62 Acute posthemorrhagic anemia: Secondary | ICD-10-CM | POA: Diagnosis not present

## 2020-10-02 DIAGNOSIS — I480 Paroxysmal atrial fibrillation: Secondary | ICD-10-CM | POA: Diagnosis present

## 2020-10-02 DIAGNOSIS — R55 Syncope and collapse: Secondary | ICD-10-CM | POA: Diagnosis present

## 2020-10-02 DIAGNOSIS — I5032 Chronic diastolic (congestive) heart failure: Secondary | ICD-10-CM | POA: Diagnosis present

## 2020-10-02 DIAGNOSIS — E860 Dehydration: Secondary | ICD-10-CM | POA: Diagnosis present

## 2020-10-02 DIAGNOSIS — I10 Essential (primary) hypertension: Secondary | ICD-10-CM | POA: Diagnosis not present

## 2020-10-02 DIAGNOSIS — E1021 Type 1 diabetes mellitus with diabetic nephropathy: Secondary | ICD-10-CM

## 2020-10-02 DIAGNOSIS — D6859 Other primary thrombophilia: Secondary | ICD-10-CM | POA: Diagnosis present

## 2020-10-02 DIAGNOSIS — N183 Chronic kidney disease, stage 3 unspecified: Secondary | ICD-10-CM | POA: Diagnosis present

## 2020-10-02 DIAGNOSIS — K2980 Duodenitis without bleeding: Secondary | ICD-10-CM | POA: Diagnosis present

## 2020-10-02 DIAGNOSIS — Z9221 Personal history of antineoplastic chemotherapy: Secondary | ICD-10-CM

## 2020-10-02 DIAGNOSIS — Z794 Long term (current) use of insulin: Secondary | ICD-10-CM

## 2020-10-02 DIAGNOSIS — E039 Hypothyroidism, unspecified: Secondary | ICD-10-CM | POA: Diagnosis present

## 2020-10-02 DIAGNOSIS — M109 Gout, unspecified: Secondary | ICD-10-CM | POA: Diagnosis present

## 2020-10-02 DIAGNOSIS — E1022 Type 1 diabetes mellitus with diabetic chronic kidney disease: Secondary | ICD-10-CM | POA: Diagnosis present

## 2020-10-02 DIAGNOSIS — K222 Esophageal obstruction: Secondary | ICD-10-CM | POA: Diagnosis present

## 2020-10-02 DIAGNOSIS — N170 Acute kidney failure with tubular necrosis: Secondary | ICD-10-CM | POA: Diagnosis not present

## 2020-10-02 DIAGNOSIS — Z951 Presence of aortocoronary bypass graft: Secondary | ICD-10-CM | POA: Diagnosis not present

## 2020-10-02 DIAGNOSIS — R590 Localized enlarged lymph nodes: Secondary | ICD-10-CM | POA: Diagnosis present

## 2020-10-02 DIAGNOSIS — Z8249 Family history of ischemic heart disease and other diseases of the circulatory system: Secondary | ICD-10-CM

## 2020-10-02 DIAGNOSIS — N179 Acute kidney failure, unspecified: Secondary | ICD-10-CM | POA: Diagnosis present

## 2020-10-02 DIAGNOSIS — Z6837 Body mass index (BMI) 37.0-37.9, adult: Secondary | ICD-10-CM

## 2020-10-02 DIAGNOSIS — D6481 Anemia due to antineoplastic chemotherapy: Secondary | ICD-10-CM | POA: Diagnosis present

## 2020-10-02 DIAGNOSIS — T502X5A Adverse effect of carbonic-anhydrase inhibitors, benzothiadiazides and other diuretics, initial encounter: Secondary | ICD-10-CM | POA: Diagnosis present

## 2020-10-02 DIAGNOSIS — E878 Other disorders of electrolyte and fluid balance, not elsewhere classified: Secondary | ICD-10-CM | POA: Diagnosis present

## 2020-10-02 DIAGNOSIS — K449 Diaphragmatic hernia without obstruction or gangrene: Secondary | ICD-10-CM | POA: Diagnosis present

## 2020-10-02 DIAGNOSIS — T451X5A Adverse effect of antineoplastic and immunosuppressive drugs, initial encounter: Secondary | ICD-10-CM | POA: Diagnosis present

## 2020-10-02 DIAGNOSIS — Z8673 Personal history of transient ischemic attack (TIA), and cerebral infarction without residual deficits: Secondary | ICD-10-CM

## 2020-10-02 DIAGNOSIS — Z7901 Long term (current) use of anticoagulants: Secondary | ICD-10-CM

## 2020-10-02 DIAGNOSIS — R54 Age-related physical debility: Secondary | ICD-10-CM | POA: Diagnosis present

## 2020-10-02 DIAGNOSIS — R1310 Dysphagia, unspecified: Secondary | ICD-10-CM | POA: Diagnosis present

## 2020-10-02 DIAGNOSIS — N400 Enlarged prostate without lower urinary tract symptoms: Secondary | ICD-10-CM | POA: Diagnosis present

## 2020-10-02 DIAGNOSIS — R110 Nausea: Secondary | ICD-10-CM | POA: Diagnosis present

## 2020-10-02 LAB — CBC WITH DIFFERENTIAL/PLATELET
Abs Immature Granulocytes: 0.65 10*3/uL — ABNORMAL HIGH (ref 0.00–0.07)
Basophils Absolute: 0 10*3/uL (ref 0.0–0.1)
Basophils Relative: 0 %
Eosinophils Absolute: 0 10*3/uL (ref 0.0–0.5)
Eosinophils Relative: 0 %
HCT: 26 % — ABNORMAL LOW (ref 39.0–52.0)
Hemoglobin: 8.4 g/dL — ABNORMAL LOW (ref 13.0–17.0)
Immature Granulocytes: 5 %
Lymphocytes Relative: 9 %
Lymphs Abs: 1.3 10*3/uL (ref 0.7–4.0)
MCH: 30.8 pg (ref 26.0–34.0)
MCHC: 32.3 g/dL (ref 30.0–36.0)
MCV: 95.2 fL (ref 80.0–100.0)
Monocytes Absolute: 1.4 10*3/uL — ABNORMAL HIGH (ref 0.1–1.0)
Monocytes Relative: 10 %
Neutro Abs: 11 10*3/uL — ABNORMAL HIGH (ref 1.7–7.7)
Neutrophils Relative %: 76 %
Platelets: 292 10*3/uL (ref 150–400)
RBC: 2.73 MIL/uL — ABNORMAL LOW (ref 4.22–5.81)
RDW: 15.6 % — ABNORMAL HIGH (ref 11.5–15.5)
WBC: 14.2 10*3/uL — ABNORMAL HIGH (ref 4.0–10.5)
nRBC: 0.3 % — ABNORMAL HIGH (ref 0.0–0.2)

## 2020-10-02 LAB — COMPREHENSIVE METABOLIC PANEL
ALT: 20 U/L (ref 0–44)
AST: 29 U/L (ref 15–41)
Albumin: 2.6 g/dL — ABNORMAL LOW (ref 3.5–5.0)
Alkaline Phosphatase: 60 U/L (ref 38–126)
Anion gap: 11 (ref 5–15)
BUN: 82 mg/dL — ABNORMAL HIGH (ref 8–23)
CO2: 26 mmol/L (ref 22–32)
Calcium: 8.5 mg/dL — ABNORMAL LOW (ref 8.9–10.3)
Chloride: 100 mmol/L (ref 98–111)
Creatinine, Ser: 1.7 mg/dL — ABNORMAL HIGH (ref 0.61–1.24)
GFR, Estimated: 38 mL/min — ABNORMAL LOW (ref >60)
Glucose, Bld: 247 mg/dL — ABNORMAL HIGH (ref 70–99)
Potassium: 4.9 mmol/L (ref 3.5–5.1)
Sodium: 137 mmol/L (ref 135–145)
Total Bilirubin: 0.4 mg/dL (ref 0.3–1.2)
Total Protein: 5.2 g/dL — ABNORMAL LOW (ref 6.5–8.1)

## 2020-10-02 LAB — BRAIN NATRIURETIC PEPTIDE: B Natriuretic Peptide: 344.4 pg/mL — ABNORMAL HIGH (ref 0.0–100.0)

## 2020-10-02 LAB — RESPIRATORY PANEL BY RT PCR (FLU A&B, COVID)
Influenza A by PCR: NEGATIVE
Influenza B by PCR: NEGATIVE
SARS Coronavirus 2 by RT PCR: NEGATIVE

## 2020-10-02 LAB — TROPONIN I (HIGH SENSITIVITY)
Troponin I (High Sensitivity): 55 ng/L — ABNORMAL HIGH (ref <18)
Troponin I (High Sensitivity): 52 ng/L — ABNORMAL HIGH (ref <18)

## 2020-10-02 LAB — LIPASE, BLOOD: Lipase: 28 U/L (ref 11–51)

## 2020-10-02 MED ORDER — ZOLPIDEM TARTRATE 5 MG PO TABS
5.0000 mg | ORAL_TABLET | Freq: Every evening | ORAL | Status: DC | PRN
  Administered 2020-10-06: 5 mg via ORAL
  Filled 2020-10-02: qty 1

## 2020-10-02 MED ORDER — NEPRO/CARBSTEADY PO LIQD
237.0000 mL | Freq: Three times a day (TID) | ORAL | Status: DC | PRN
  Filled 2020-10-02: qty 237

## 2020-10-02 MED ORDER — SODIUM CHLORIDE 0.9 % IV BOLUS: 1000.0000 mL | Freq: Once | INTRAVENOUS | Status: DC

## 2020-10-02 MED ORDER — ONDANSETRON HCL 4 MG PO TABS: 4.0000 mg | ORAL_TABLET | Freq: Four times a day (QID) | ORAL | Status: DC | PRN

## 2020-10-02 MED ORDER — SODIUM CHLORIDE 0.9 % IV SOLN: INTRAVENOUS | Status: DC

## 2020-10-02 MED ORDER — ACETAMINOPHEN 650 MG RE SUPP: 650.0000 mg | Freq: Four times a day (QID) | RECTAL | Status: DC | PRN

## 2020-10-02 MED ORDER — CALCIUM CARBONATE ANTACID 1250 MG/5ML PO SUSP
500.0000 mg | Freq: Four times a day (QID) | ORAL | Status: DC | PRN
  Filled 2020-10-02: qty 5

## 2020-10-02 MED ORDER — CAMPHOR-MENTHOL 0.5-0.5 % EX LOTN
1.0000 "application " | TOPICAL_LOTION | Freq: Three times a day (TID) | CUTANEOUS | Status: DC | PRN
  Filled 2020-10-02: qty 222

## 2020-10-02 MED ORDER — ONDANSETRON HCL 4 MG/2ML IJ SOLN: 4.0000 mg | Freq: Four times a day (QID) | INTRAMUSCULAR | Status: DC | PRN

## 2020-10-02 MED ORDER — SODIUM CHLORIDE 0.9 % IV BOLUS
500.0000 mL | Freq: Once | INTRAVENOUS | Status: AC
  Administered 2020-10-02: 500 mL via INTRAVENOUS

## 2020-10-02 MED ORDER — APIXABAN 5 MG PO TABS
5.0000 mg | ORAL_TABLET | Freq: Two times a day (BID) | ORAL | Status: DC
  Administered 2020-10-03 (×3): 5 mg via ORAL
  Filled 2020-10-02 (×3): qty 1

## 2020-10-02 MED ORDER — DOCUSATE SODIUM 283 MG RE ENEM
1.0000 | ENEMA | RECTAL | Status: DC | PRN
  Filled 2020-10-02: qty 1

## 2020-10-02 MED ORDER — SORBITOL 70 % SOLN
30.0000 mL | Status: DC | PRN
  Filled 2020-10-02: qty 30

## 2020-10-02 MED ORDER — HYDROXYZINE HCL 25 MG PO TABS: 25.0000 mg | ORAL_TABLET | Freq: Three times a day (TID) | ORAL | Status: DC | PRN

## 2020-10-02 MED ORDER — ACETAMINOPHEN 325 MG PO TABS: 650.0000 mg | ORAL_TABLET | Freq: Four times a day (QID) | ORAL | Status: DC | PRN

## 2020-10-02 MED ORDER — ONDANSETRON HCL 4 MG/2ML IJ SOLN
4.0000 mg | Freq: Once | INTRAMUSCULAR | Status: DC
  Filled 2020-10-02: qty 2

## 2020-10-02 NOTE — ED Provider Notes (Signed)
[facility] DEPT Provider Note   CSN: [id number] Arrival date & time: 10/02/20  1623     History Chief Complaint  Patient presents with  . Loss of Consciousness    Randall Wilkins is a 79 y.o. male.  History of stage IV non-small cell lung cancer currently on chemotherapy presenting to ER with concern for syncope, generalized fatigue, nausea.  Patient reports that he recently has been feeling very fatigued, nauseous, has struggled since being on chemotherapy.  States that he ate today he felt more fatigued and generally weak, no focal weakness, left similar to right.  Has had difficulty walking due to his weakness.  He had an episode this morning where he felt very lightheaded and almost passed out but not have complete loss of consciousness.  Currently does not feel lightheaded.  No abdominal pain or chest pain.  Patient additionally has history of coronary artery disease, diastolic heart failure, diabetes, CKD, hypertension.  HPI     Past Medical History:  Diagnosis Date  . Aortic stenosis, mild 11/14/2013  . Aortic valve sclerosis 03/29/2015  . Bilateral leg edema 05/21/2014  . CAD (coronary artery disease)   . Chronic diastolic congestive heart failure ([place])   . Chronic kidney disease (CKD), stage III (moderate) (HCC)   . Diabetes 1.5, managed as type 1 ([place]) 02/04/2013  . Diabetic peripheral neuropathy associated with type 1 diabetes mellitus ([place]) 11/14/2013  . Dysphagia   . Dyspnea on exertion 03/21/2015  . Exogenous obesity   . Gout   . Heart attack ([place])   . History of nuclear stress test 07/2011   dipyridamole; fixed inferolateral defect, worse at stress than rest; no reversible ischemia; low risk scan   . Hyperlipidemia   . Hypertension   . Hypothyroidism   . Insulin dependent diabetes mellitus   . Left foot drop   . Left main coronary artery disease 03/28/2015  . Memory loss   . Obesity (BMI 30-39.9) 11/14/2013  . Obstructive sleep apnea  03/21/2015  . OSA on CPAP    uses a cpap  . Peptic ulcer with hemorrhage 03/28/2015  . Peripheral neuropathy   . Pneumonia   . Rhabdomyolysis   . 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 Stroke ([place])    L patietal with small scattered lacunar infarcts  . Thrombocytopenia ([place]) 03/21/2015  . Venous insufficiency   . Weakness generalized 03/21/2015    Patient Active Problem List   Diagnosis Date Noted  . Adenocarcinoma of right lung, stage 4 ([place]) 09/02/2020  . Encounter for antineoplastic immunotherapy 09/02/2020  . Goals of care, counseling/discussion 08/20/2020  . Encounter for antineoplastic chemotherapy 08/20/2020  . Mediastinal lymphadenopathy 08/06/2020  . Lung mass 06/05/2020  . Essential hypertension 03/23/2018  . Mild cognitive impairment 12/24/2017  . Gait abnormality 12/24/2017  . Murmur, cardiac 03/04/2017  . History of stroke 12/12/2015  . Gait disorder 12/12/2015  . Coronary artery disease involving native coronary artery of native heart without angina pectoris   . S/P CABG x 2 03/29/2015  . Aortic valve sclerosis 03/29/2015  . Peptic ulcer with hemorrhage 03/28/2015  . Left main coronary artery disease 03/28/2015  . Chronic diastolic congestive heart failure ([place])   . Chronic kidney disease (CKD), stage III (moderate) (HCC)   . Dysphagia   . AKI (acute kidney injury) ([place])   . Acute respiratory failure with hypoxia ([place]) 03/25/2015  . NSTEMI (non-ST  elevated myocardial infarction) ([place]) 03/25/2015  . Shortness of breath   . UTI (lower urinary tract infection)   . Subendocardial MI subsequent episode care ([place]) 03/24/2015  . SOB (shortness of breath)   . Confusion 03/21/2015  . Weakness generalized 03/21/2015  . Increased urinary frequency 03/21/2015  . Hyperglycemia 03/21/2015  . Thrombocytopenia ([place]) 03/21/2015  . UTI (urinary tract infection) 03/21/2015  . Fall 03/21/2015  . Hematoma of abdominal wall 03/21/2015  .  Diarrhea 03/21/2015  . Obstructive sleep apnea 03/21/2015  . Acute renal failure ([place]) 03/21/2015  . Mild diastolic dysfunction 79/48/0165  . Dyspnea on exertion 03/21/2015  . Neuropathy 09/11/2014  . Bilateral leg edema 05/21/2014  . Diabetic peripheral neuropathy associated with type 1 diabetes mellitus ([place]) 11/14/2013  . Type 1 diabetes mellitus with nephropathy ([place]) 11/14/2013  . Aortic valve stenosis 11/14/2013  . Obesity (BMI 30-39.9) 11/14/2013  . Asymptomatic PVCs 11/14/2013  . Memory impairment 02/04/2013  . Hyperlipidemia 02/04/2013  . Diabetes 1.5, managed as type 1 ([place]) 02/04/2013  . Cerebral infarction ([place]) 02/04/2013  . CAD S/P percutaneous coronary angioplasty 11/14/2004    Past Surgical History:  Procedure Laterality Date  . BACK SURGERY  2002   lumbosacral. 11 back surgeries total  . BRONCHIAL BIOPSY  08/06/2020   Procedure: BRONCHIAL BIOPSIES;  Surgeon: Collene Gobble, MD;  Location: [facility] ENDOSCOPY;  Service: Pulmonary;;  . BRONCHIAL BRUSHINGS  08/06/2020   Procedure: BRONCHIAL BRUSHINGS;  Surgeon: Collene Gobble, MD;  Location: [facility] ENDOSCOPY;  Service: Pulmonary;;  . BRONCHIAL NEEDLE ASPIRATION BIOPSY  08/06/2020   Procedure: BRONCHIAL NEEDLE ASPIRATION BIOPSIES;  Surgeon: Collene Gobble, MD;  Location: [facility] ENDOSCOPY;  Service: Pulmonary;;  . Carotid Doppler  03/2013   bilat bulb/prox ICAs - mild amount of fibrous plaque with no evidence of diameter reduction  . CARPAL TUNNEL RELEASE Bilateral 08/09/2014   Procedure: BILATERAL CARPAL TUNNEL RELEASE;  Surgeon: Daryll Brod, MD;  Location: [facility];  Service: Orthopedics;  Laterality: Bilateral;  ANESTHESIA:  IV REGIONAL BIL FAB  . CHOLECYSTECTOMY    . COLONOSCOPY    . CORONARY ANGIOPLASTY  10/13/1996  . CORONARY ANGIOPLASTY  09/21/1989   emergency PTCA  . CORONARY ANGIOPLASTY  10/13/1996   Multi-Link diagonal & OD stenting (Dr. Marella Chimes)  . CORONARY ANGIOPLASTY  12/03/1997   disease of mid  DX-1 ~50% & in mid PLA & PDA (distal lesions) (Dr. Marella Chimes)   . CORONARY ANGIOPLASTY  10/14/1999   progression of disease distal PLA & PDA; progression of disease prox RCA - moderate (Dr. Marella Chimes)   . CORONARY ANGIOPLASTY WITH STENT PLACEMENT  04/04/2004   4.0x32m non-DES (thrombectomy via AngioJet) to RCA for high grade stenosis (Dr. RMarella Chimes  . CORONARY ARTERY BYPASS GRAFT N/A 03/29/2015   Procedure: CORONARY ARTERY BYPASS GRAFTING TIMES TWO USING LEFT INTERNAL MAMMARY ARTERY AND RIGHT LEG GREATER SAPHENOUS VEIN HARVESTED ENDOSCOPICALLY.;  Surgeon: CRexene Alberts MD;  [facility] [facility]  Service: Open Heart Surgery;  Laterality: N/A;  . ESOPHAGOGASTRODUODENOSCOPY N/A 03/27/2015   Procedure: ESOPHAGOGASTRODUODENOSCOPY (EGD);  Surgeon: MClarene Essex MD;  [facility];  Service: Endoscopy;  Laterality: N/A;  possible dilation  . FINE NEEDLE ASPIRATION  08/06/2020   Procedure: FINE NEEDLE ASPIRATION (FNA) LINEAR;  Surgeon: BCollene Gobble MD;  [facility];  Service: Pulmonary;;  . LEFT HEART CATHETERIZATION WITH CORONARY ANGIOGRAM N/A 03/28/2015   Procedure: LEFT HEART CATHETERIZATION WITH CORONARY ANGIOGRAM;  Surgeon: Peter M JMartinique MD;  Location:  [facility] CATH LAB;  Service: Cardiovascular;  Laterality: N/A;  . SINUS ENDO W/FUSION    . TONSILLECTOMY    . TRANSTHORACIC ECHOCARDIOGRAM  08/08/2013   EF 55-60%, mild conc hypertrophy, grade 1 diastolic dysfunction; AV with mild stenosis; LA & RA mildly dilated  . VIDEO BRONCHOSCOPY WITH ENDOBRONCHIAL NAVIGATION N/A 08/06/2020   Procedure: VIDEO BRONCHOSCOPY WITH ENDOBRONCHIAL NAVIGATION;  Surgeon: Collene Gobble, MD;  Location: [facility] ENDOSCOPY;  Service: Pulmonary;  Laterality: N/A;  . VIDEO BRONCHOSCOPY WITH ENDOBRONCHIAL ULTRASOUND N/A 08/06/2020   Procedure: VIDEO BRONCHOSCOPY WITH ENDOBRONCHIAL ULTRASOUND;  Surgeon: Collene Gobble, MD;  Location: [facility] ENDOSCOPY;  Service: Pulmonary;  Laterality: N/A;       Family History    Problem Relation Age of Onset  . Heart disease Mother   . Coronary artery disease Father   . Cancer Maternal Grandmother   . Heart Problems Maternal Grandfather   . Diabetes Son        borderline     Social History   Tobacco Use  . Smoking status: Former Smoker    Packs/day: 2.00    Years: 20.00    Pack years: 40.00    Types: Cigarettes    Quit date: 01/25/1974    Years since quitting: 46.7  . Smokeless tobacco: Never Used  Vaping Use  . Vaping Use: Never used  Substance Use Topics  . Alcohol use: No    Alcohol/week: 0.0 standard drinks  . Drug use: No    Home Medications Prior to Admission medications   Medication Sig Start Date End Date Taking? Authorizing Provider  albuterol (VENTOLIN HFA) 108 (90 Base) MCG/ACT inhaler Inhale 2 puffs into the lungs every 6 (six) hours as needed for wheezing or shortness of breath. 09/04/20   Collene Gobble, MD  allopurinol (ZYLOPRIM) 300 MG tablet Take 300 mg by mouth daily.  02/20/16   [provider]  amitriptyline (ELAVIL) 50 MG tablet Take 75 mg by mouth at bedtime.     [provider]  apixaban (ELIQUIS) 5 MG TABS tablet Take 1 tablet (5 mg total) by mouth 2 (two) times daily. You may restart this medication on Thursday, 08/08/2020. Patient taking differently: Take 5 mg by mouth 2 (two) times daily.  08/06/20   Collene Gobble, MD  aspirin 81 MG tablet Take 1 tablet (81 mg total) by mouth daily. 04/29/15   Rexene Alberts, MD  Blood Glucose Monitoring Suppl (CONTOUR NEXT MONITOR) w/Device KIT 1 Device by Does not apply route 3 (three) times daily. Use to check blood sugars 3 times daily. Dx Code E13.9 07/29/17   Elayne Snare, MD  CONTOUR NEXT TEST test strip USE 1 STRIP TO CHECK GLUCOSE 4 TIMES DAILY 08/09/20   Elayne Snare, MD  donepezil (ARICEPT) 10 MG tablet TAKE ONE TABLET DAILY.  PLEASE CALL [phone] TO SCHEDULE FOLLOW UP. Patient taking differently: Take 10 mg by mouth daily.  07/10/19   Marcial Pacas, MD  folic acid  (FOLVITE) 1 MG tablet Take 1 tablet (1 mg total) by mouth daily. 09/02/20   Curt Bears, MD  furosemide (LASIX) 40 MG tablet Take 80 mg by mouth in the morning.  03/11/20   [provider]  insulin degludec (TRESIBA FLEXTOUCH) 200 UNIT/ML FlexTouch Pen Inject 46 Units into the skin at bedtime.    [provider]  insulin lispro (HUMALOG) 100 UNIT/ML cartridge Inject 5-15 Units into the skin 3 (three) times daily with meals. Sliding scale.    [provider]  Insulin Pen Needle (EASY COMFORT PEN NEEDLES) 33G X 4 MM MISC 1 each by Does not apply route See admin instructions. Use to inject insulin 5 times daily. 05/31/19   Elayne Snare, MD  levothyroxine (SYNTHROID, LEVOTHROID) 75 MCG tablet Take 75 mcg by mouth daily before breakfast.  05/14/17   [provider]  lidocaine (XYLOCAINE) 2 % solution Use as directed 15 mLs in the mouth or throat as needed for mouth pain. 09/19/20   Kinnie Feil, PA-C  lidocaine-prilocaine (EMLA) cream Apply 1 application topically as needed. Apply a teaspoon over port site at least 1 hour prior to lab appt. Do not rub in and cover with plastic wrap. 09/30/20   Curt Bears, MD  liraglutide (VICTOZA) 18 MG/3ML SOPN Inject 1.2 mg into the skin daily.     [provider]  lisinopril (ZESTRIL) 10 MG tablet TAKE ONE TABLET DAILY Patient taking differently: Take 10 mg by mouth daily.  08/12/20   Elayne Snare, MD  metFORMIN (GLUCOPHAGE-XR) 750 MG 24 hr tablet TAKE 1 TABLET WITH DINNER Patient taking differently: Take 750 mg by mouth daily.  08/12/20   Elayne Snare, MD  metoprolol succinate (TOPROL-XL) 25 MG 24 hr tablet Take 1 tablet (25 mg total) by mouth daily. 03/13/20   Hilty, Nadean Corwin, MD  NON FORMULARY Inhale 1 application into the lungs at bedtime. CPAP    [provider]  potassium chloride SA (K-DUR,KLOR-CON) 20 MEQ tablet Take 20 mEq by mouth 2 (two) times daily.     [provider]  prochlorperazine  (COMPAZINE) 10 MG tablet Take 1 tablet (10 mg total) by mouth every 6 (six) hours as needed for nausea or vomiting. 09/02/20   Curt Bears, MD  rosuvastatin (CRESTOR) 20 MG tablet Take 20 mg by mouth daily.    [provider]  Tamsulosin HCl (FLOMAX) 0.4 MG CAPS Take 0.4 mg by mouth daily.     [provider]  torsemide (DEMADEX) 10 MG tablet Take 10 mg by mouth every evening.     [provider]  vitamin B-12 (CYANOCOBALAMIN) 1000 MCG tablet Take 1,000 mcg by mouth daily.    [provider]    Allergies    Actos [pioglitazone], Metformin and related, and Niaspan [niacin er]  Review of Systems   Review of Systems  Constitutional: Negative for chills and fever.  HENT: Negative for ear pain and sore throat.   Eyes: Negative for pain and visual disturbance.  Respiratory: Negative for cough and shortness of breath.   Cardiovascular: Negative for chest pain and palpitations.  Gastrointestinal: Positive for nausea. Negative for abdominal pain and vomiting.  Genitourinary: Negative for dysuria and hematuria.  Musculoskeletal: Negative for arthralgias and back pain.  Skin: Negative for color change and rash.  Neurological: Positive for syncope and light-headedness. Negative for seizures.  All other systems reviewed and are negative.   Physical Exam Updated Vital Signs BP 105/65 (BP Location: Left Arm)   Pulse 92   Temp 98.6 F (37 C) (Oral)   Resp 20   Ht '5\' 7"'  (1.702 m)   Wt 108.9 kg   SpO2 100%   BMI 37.59 kg/m   Physical Exam Vitals and nursing note reviewed.  Constitutional:      Appearance: He is well-developed.  HENT:     Head: Normocephalic and atraumatic.  Eyes:     Conjunctiva/sclera: Conjunctivae normal.  Cardiovascular:     Rate and Rhythm: Normal rate and regular rhythm.  Heart sounds: No murmur heard.   Pulmonary:     Effort: Pulmonary effort is normal. No respiratory distress.     Breath sounds: Normal breath sounds.   Abdominal:     Palpations: Abdomen is soft.     Tenderness: There is no abdominal tenderness.  Musculoskeletal:        General: No deformity or signs of injury.     Cervical back: Neck supple.  Skin:    General: Skin is warm and dry.  Neurological:     General: No focal deficit present.     Mental Status: He is alert.  Psychiatric:        Mood and Affect: Mood normal.        Behavior: Behavior normal.     ED Results / Procedures / Treatments   Labs (all labs ordered are listed, but only abnormal results are displayed) Labs Reviewed  CBC WITH DIFFERENTIAL/PLATELET - Abnormal; Notable for the following components:      Result Value   WBC 14.2 (*)    RBC 2.73 (*)    Hemoglobin 8.4 (*)    HCT 26.0 (*)    RDW 15.6 (*)    nRBC 0.3 (*)    Neutro Abs 11.0 (*)    Monocytes Absolute 1.4 (*)    Abs Immature Granulocytes 0.65 (*)    All other components within normal limits  COMPREHENSIVE METABOLIC PANEL - Abnormal; Notable for the following components:   Glucose, Bld 247 (*)    BUN 82 (*)    Creatinine, Ser 1.70 (*)    Calcium 8.5 (*)    Total Protein 5.2 (*)    Albumin 2.6 (*)    GFR, Estimated 38 (*)    All other components within normal limits  TROPONIN I (HIGH SENSITIVITY) - Abnormal; Notable for the following components:   Troponin I (High Sensitivity) 55 (*)    All other components within normal limits  RESPIRATORY PANEL BY RT PCR (FLU A&B, COVID)  LIPASE, BLOOD  BRAIN NATRIURETIC PEPTIDE  TROPONIN I (HIGH SENSITIVITY)    EKG EKG Interpretation  Date/Time:  Wednesday October 02 2020 17:22:00 EDT Ventricular Rate:  70 PR Interval:    QRS Duration: 88 QT Interval:  382 QTC Calculation: 413 R Axis:   -11 Text Interpretation: Sinus rhythm Prolonged PR interval Probable left atrial enlargement Abnormal R-wave progression, late transition Borderline T wave abnormalities Confirmed by Madalyn Rob [phone]) on 10/02/2020 6:01:46 PM   Radiology DG Chest Portable  1 View  Result Date: 10/02/2020 CLINICAL DATA:  History of known lung cancer with weakness, initial encounter EXAM: PORTABLE CHEST 1 VIEW COMPARISON:  PET-CT from 08/22/2020 FINDINGS: Cardiac shadow is stable. Postsurgical changes are again seen. Bilateral soft tissue lung masses are noted consistent with the given clinical history. Elevation the right hemidiaphragm is again noted. No sizable effusion or pneumothorax is seen. No acute bony abnormality is noted. IMPRESSION: Bilateral masses consistent with the given clinical history of lung carcinoma. No acute abnormality noted. Electronically Signed   By: Inez Catalina M.D.   On: 10/02/2020 17:51    Procedures Procedures (including critical care time)  Medications Ordered in ED Medications  sodium chloride 0.9 % bolus 1,000 mL (has no administration in time range)  ondansetron (ZOFRAN) injection 4 mg (has no administration in time range)    ED Course  I have reviewed the triage vital signs and the nursing notes.  Pertinent labs & imaging results that were available during my care  of the patient were reviewed by me and considered in my medical decision making (see chart for details).    MDM Rules/Calculators/A&P                         79 year old male with history of lung cancer currently on chemotherapy presents to ER with concern for generalized weakness, nausea and syncope.  On exam patient is well-appearing in no distress, noted mild hypoxia on room air, did well on 2 L nasal cannula.  Abdomen is soft, neuro exam nonfocal.  No events on telemetry monitoring, EKG without ischemic changes, no chest pain, troponin was mildly elevated, low suspicion for ACS at this time but believe this should be further monitored.  BMP notable for AKI, creatinine increased from baseline, suspect dehydration, nausea secondary to chemo side effects.  CXR stable.  Patient anticoagulated already for A. Fib history -lower suspicion for PE at this time.  We will  touch base with oncology, admit to medicine for further management, observation due to his mild hypoxia, AKI, nausea.  Dr. Alvy Bimler with oncology recommends admission, requests someone reach out tomorrow morning to ask Dr. Mckinley Jewel pt primary onc to see.  D/w hosp who will admit.  Alimta and Keytruda are current chemo agents.    Final Clinical Impression(s) / ED Diagnoses Final diagnoses:  AKI (acute kidney injury) ([place])  Nausea    Rx / DC Orders ED Discharge Orders    None       Lucrezia Starch, MD 10/02/20 2050

## 2020-10-02 NOTE — H&P (Signed)
History and Physical   Randall Wilkins XTG:[id number] DOB: December 22, 1940 DOA: 10/02/2020  Referring MD/NP/PA: Dr. Reubin Milan  PCP: [name], MD   Outpatient Specialists: Dr. Lorna Few  Patient coming from: Home  Chief Complaint: Weakness and nausea  HPI: Randall Wilkins is a 79 y.o. male with medical history significant of hypertension, diabetes, aortic stenosis, hypothyroidism, coronary artery disease status post PCI, chronic anticoagulation and stage IV lung cancer currently on chemo whose last chemo was 2 days ago that came to the ER with weakness shortness of breath as well as nausea.  Patient did get his chemo on Monday was feeling better at that time.  He went home but then started feeling weak and tired.  This morning he woke up with significant nausea.  He took some nausea medication and then suddenly became weak debilitated and not himself.  He was slightly confused so wife brought him to the ER where he was evaluated.  He was found to be significantly dehydrated.  Has acute on chronic kidney disease as well as elevated blood sugar.  Patient therefore being admitted to the hospital for evaluation.  ED Course: Temperature 98.6 blood pressure 130/54 pulse 90 respirate of 20 oxygen sat 90% on room air.  White count is 14 point hemoglobin 8.4 platelet count of 292.  Chemistry showed BUN of 82 creatinine 1.70 and calcium 8.5.  Glucose is 247.  Troponin is 55 and 52.  COVID-19 screen is negative.  Chest x-ray shows bilateral masses consistent with lung cancer.  Patient being admitted for further evaluation and treatment.  Review of Systems: As per HPI otherwise 10 point review of systems negative.    Past Medical History:  Diagnosis Date  . Aortic stenosis, mild 11/14/2013  . Aortic valve sclerosis 03/29/2015  . Bilateral leg edema 05/21/2014  . CAD (coronary artery disease)   . Chronic diastolic congestive heart failure ([place])   . Chronic kidney disease (CKD), stage III (moderate)  (HCC)   . Diabetes 1.5, managed as type 1 ([place]) 02/04/2013  . Diabetic peripheral neuropathy associated with type 1 diabetes mellitus ([place]) 11/14/2013  . Dysphagia   . Dyspnea on exertion 03/21/2015  . Exogenous obesity   . Gout   . Heart attack ([place])   . History of nuclear stress test 07/2011   dipyridamole; fixed inferolateral defect, worse at stress than rest; no reversible ischemia; low risk scan   . Hyperlipidemia   . Hypertension   . Hypothyroidism   . Insulin dependent diabetes mellitus   . Left foot drop   . Left main coronary artery disease 03/28/2015  . Memory loss   . Obesity (BMI 30-39.9) 11/14/2013  . Obstructive sleep apnea 03/21/2015  . OSA on CPAP    uses a cpap  . Peptic ulcer with hemorrhage 03/28/2015  . Peripheral neuropathy   . Pneumonia   . Rhabdomyolysis   . 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 Stroke ([place])    L patietal with small scattered lacunar infarcts  . Thrombocytopenia ([place]) 03/21/2015  . Venous insufficiency   . Weakness generalized 03/21/2015    Past Surgical History:  Procedure Laterality Date  . BACK SURGERY  2002   lumbosacral. 11 back surgeries total  . BRONCHIAL BIOPSY  08/06/2020   Procedure: BRONCHIAL BIOPSIES;  Surgeon: Collene Gobble, MD;  Location: [facility] ENDOSCOPY;  Service: Pulmonary;;  . BRONCHIAL BRUSHINGS  08/06/2020   Procedure: BRONCHIAL BRUSHINGS;  Surgeon: Collene Gobble, MD;  Location: [facility] ENDOSCOPY;  Service: Pulmonary;;  . BRONCHIAL NEEDLE ASPIRATION BIOPSY  08/06/2020   Procedure: BRONCHIAL NEEDLE ASPIRATION BIOPSIES;  Surgeon: Collene Gobble, MD;  Location: [place] ENDOSCOPY;  Service: Pulmonary;;  . Carotid Doppler  03/2013   bilat bulb/prox ICAs - mild amount of fibrous plaque with no evidence of diameter reduction  . CARPAL TUNNEL RELEASE Bilateral 08/09/2014   Procedure: BILATERAL CARPAL TUNNEL RELEASE;  Surgeon: Daryll Brod, MD;  Location: [place];  Service: Orthopedics;   Laterality: Bilateral;  ANESTHESIA:  IV REGIONAL BIL FAB  . CHOLECYSTECTOMY    . COLONOSCOPY    . CORONARY ANGIOPLASTY  10/13/1996  . CORONARY ANGIOPLASTY  09/21/1989   emergency PTCA  . CORONARY ANGIOPLASTY  10/13/1996   Multi-Link diagonal & OD stenting (Dr. Marella Chimes)  . CORONARY ANGIOPLASTY  12/03/1997   disease of mid DX-1 ~50% & in mid PLA & PDA (distal lesions) (Dr. Marella Chimes)   . CORONARY ANGIOPLASTY  10/14/1999   progression of disease distal PLA & PDA; progression of disease prox RCA - moderate (Dr. Marella Chimes)   . CORONARY ANGIOPLASTY WITH STENT PLACEMENT  04/04/2004   4.0x13m non-DES (thrombectomy via AngioJet) to RCA for high grade stenosis (Dr. RMarella Chimes  . CORONARY ARTERY BYPASS GRAFT N/A 03/29/2015   Procedure: CORONARY ARTERY BYPASS GRAFTING TIMES TWO USING LEFT INTERNAL MAMMARY ARTERY AND RIGHT LEG GREATER SAPHENOUS VEIN HARVESTED ENDOSCOPICALLY.;  Surgeon: CRexene Alberts MD;  [place]  Service: Open Heart Surgery;  Laterality: N/A;  . ESOPHAGOGASTRODUODENOSCOPY N/A 03/27/2015   Procedure: ESOPHAGOGASTRODUODENOSCOPY (EGD);  Surgeon: MClarene Essex MD;  [facility];  Service: Endoscopy;  Laterality: N/A;  possible dilation  . FINE NEEDLE ASPIRATION  08/06/2020   Procedure: FINE NEEDLE ASPIRATION (FNA) LINEAR;  Surgeon: BCollene Gobble MD;  [facility];  Service: Pulmonary;;  . LEFT HEART CATHETERIZATION WITH CORONARY ANGIOGRAM N/A 03/28/2015   Procedure: LEFT HEART CATHETERIZATION WITH CORONARY ANGIOGRAM;  Surgeon: Peter M JMartinique MD;  [facility];  Service: Cardiovascular;  Laterality: N/A;  . SINUS ENDO W/FUSION    . TONSILLECTOMY    . TRANSTHORACIC ECHOCARDIOGRAM  08/08/2013   EF 55-60%, mild conc hypertrophy, grade 1 diastolic dysfunction; AV with mild stenosis; LA & RA mildly dilated  . VIDEO BRONCHOSCOPY WITH ENDOBRONCHIAL NAVIGATION N/A 08/06/2020   Procedure: VIDEO BRONCHOSCOPY WITH ENDOBRONCHIAL NAVIGATION;  Surgeon:  BCollene Gobble MD;  [facility];  Service: Pulmonary;  Laterality: N/A;  . VIDEO BRONCHOSCOPY WITH ENDOBRONCHIAL ULTRASOUND N/A 08/06/2020   Procedure: VIDEO BRONCHOSCOPY WITH ENDOBRONCHIAL ULTRASOUND;  Surgeon: BCollene Gobble MD;  [facility];  Service: Pulmonary;  Laterality: N/A;     reports that he quit smoking about 46 years ago. His smoking use included cigarettes. He has a 40.00 pack-year smoking history. He has never used smokeless tobacco. He reports that he does not drink alcohol and does not use drugs.  Allergies  Allergen Reactions  . Actos [Pioglitazone] Swelling  . Metformin And Related Nausea Only  . Niaspan [Niacin Er] Itching and Rash    Family History  Problem Relation Age of Onset  . Heart disease Mother   . Coronary artery disease Father   . Cancer Maternal Grandmother   . Heart Problems Maternal Grandfather   . Diabetes Son        borderline      Prior to Admission medications   Medication Sig Start Date End Date  Taking? Authorizing Provider  albuterol (VENTOLIN HFA) 108 (90 Base) MCG/ACT inhaler Inhale 2 puffs into the lungs every 6 (six) hours as needed for wheezing or shortness of breath. 09/04/20   Collene Gobble, MD  allopurinol (ZYLOPRIM) 300 MG tablet Take 300 mg by mouth daily.  02/20/16   [provider]  amitriptyline (ELAVIL) 50 MG tablet Take 75 mg by mouth at bedtime.     [provider]  apixaban (ELIQUIS) 5 MG TABS tablet Take 1 tablet (5 mg total) by mouth 2 (two) times daily. You may restart this medication on Thursday, 08/08/2020. Patient taking differently: Take 5 mg by mouth 2 (two) times daily.  08/06/20   Collene Gobble, MD  aspirin 81 MG tablet Take 1 tablet (81 mg total) by mouth daily. 04/29/15   Rexene Alberts, MD  Blood Glucose Monitoring Suppl (CONTOUR NEXT MONITOR) w/Device KIT 1 Device by Does not apply route 3 (three) times daily. Use to check blood sugars 3 times daily. Dx Code E13.9 07/29/17    Elayne Snare, MD  CONTOUR NEXT TEST test strip USE 1 STRIP TO CHECK GLUCOSE 4 TIMES DAILY 08/09/20   Elayne Snare, MD  donepezil (ARICEPT) 10 MG tablet TAKE ONE TABLET DAILY.  PLEASE CALL [phone] TO SCHEDULE FOLLOW UP. Patient taking differently: Take 10 mg by mouth daily.  07/10/19   Marcial Pacas, MD  folic acid (FOLVITE) 1 MG tablet Take 1 tablet (1 mg total) by mouth daily. 09/02/20   Curt Bears, MD  furosemide (LASIX) 40 MG tablet Take 80 mg by mouth in the morning.  03/11/20   [provider]  insulin degludec (TRESIBA FLEXTOUCH) 200 UNIT/ML FlexTouch Pen Inject 46 Units into the skin at bedtime.    [provider]  insulin lispro (HUMALOG) 100 UNIT/ML cartridge Inject 5-15 Units into the skin 3 (three) times daily with meals. Sliding scale.    [provider]  Insulin Pen Needle (EASY COMFORT PEN NEEDLES) 33G X 4 MM MISC 1 each by Does not apply route See admin instructions. Use to inject insulin 5 times daily. 05/31/19   Elayne Snare, MD  levothyroxine (SYNTHROID, LEVOTHROID) 75 MCG tablet Take 75 mcg by mouth daily before breakfast.  05/14/17   [provider]  lidocaine (XYLOCAINE) 2 % solution Use as directed 15 mLs in the mouth or throat as needed for mouth pain. 09/19/20   Kinnie Feil, PA-C  lidocaine-prilocaine (EMLA) cream Apply 1 application topically as needed. Apply a teaspoon over port site at least 1 hour prior to lab appt. Do not rub in and cover with plastic wrap. 09/30/20   Curt Bears, MD  liraglutide (VICTOZA) 18 MG/3ML SOPN Inject 1.2 mg into the skin daily.     [provider]  lisinopril (ZESTRIL) 10 MG tablet TAKE ONE TABLET DAILY Patient taking differently: Take 10 mg by mouth daily.  08/12/20   Elayne Snare, MD  metFORMIN (GLUCOPHAGE-XR) 750 MG 24 hr tablet TAKE 1 TABLET WITH DINNER Patient taking differently: Take 750 mg by mouth daily.  08/12/20   Elayne Snare, MD  metoprolol succinate (TOPROL-XL) 25 MG 24 hr tablet  Take 1 tablet (25 mg total) by mouth daily. 03/13/20   Hilty, Nadean Corwin, MD  NON FORMULARY Inhale 1 application into the lungs at bedtime. CPAP    [provider]  potassium chloride SA (K-DUR,KLOR-CON) 20 MEQ tablet Take 20 mEq by mouth 2 (two) times daily.     [provider]  prochlorperazine (COMPAZINE) 10 MG tablet Take 1 tablet (10 mg total) by mouth every 6 (six) hours as needed for nausea or vomiting. 09/02/20   Curt Bears, MD  rosuvastatin (CRESTOR) 20 MG tablet Take 20 mg by mouth daily.    [provider]  Tamsulosin HCl (FLOMAX) 0.4 MG CAPS Take 0.4 mg by mouth daily.     [provider]  torsemide (DEMADEX) 10 MG tablet Take 10 mg by mouth every evening.     [provider]  vitamin B-12 (CYANOCOBALAMIN) 1000 MCG tablet Take 1,000 mcg by mouth daily.    [provider]    Physical Exam: Vitals:   10/02/20 1635 10/02/20 1640 10/02/20 1659 10/02/20 1940  BP:  105/65  (!) 131/54  Pulse:  92  79  Resp:  20  16  Temp:  98.6 F (37 C)    TempSrc:  Oral    SpO2: 100% 90% 100% 90%  Weight:  108.9 kg    Height:  '5\' 7"'  (1.702 m)        Constitutional: Acutely ill looking, obese, no distress Vitals:   10/02/20 1635 10/02/20 1640 10/02/20 1659 10/02/20 1940  BP:  105/65  (!) 131/54  Pulse:  92  79  Resp:  20  16  Temp:  98.6 F (37 C)    TempSrc:  Oral    SpO2: 100% 90% 100% 90%  Weight:  108.9 kg    Height:  '5\' 7"'  (1.702 m)     Eyes: PERRL, lids and conjunctivae normal ENMT: Mucous membranes are dry. Posterior pharynx clear of any exudate or lesions.Normal dentition.  Neck: normal, supple, no masses, no thyromegaly Respiratory: Decreased air entry bilaterally, no wheezing, no crackles. Normal respiratory effort. No accessory muscle use.  Cardiovascular: Sinus tachycardia with systolic murmur, no  rubs / gallops. No extremity edema. 2+ pedal pulses. No carotid bruits.  Abdomen: no tenderness, no masses palpated. No  hepatosplenomegaly. Bowel sounds positive.  Musculoskeletal: no clubbing / cyanosis. No joint deformity upper and lower extremities. Good ROM, no contractures. Normal muscle tone.  Skin: no rashes, lesions, ulcers. No induration,  Neurologic: CN 2-12 grossly intact. Sensation intact, DTR normal. Strength 5/5 in all 4.  Psychiatric: Normal judgment and insight. Alert and oriented x 3. Normal mood.     Labs on Admission: I have personally reviewed following labs and imaging studies  CBC: Recent Labs  Lab 09/30/20 0818 10/02/20 1747  WBC 8.2 14.2*  NEUTROABS 4.2 11.0*  HGB 11.7* 8.4*  HCT 36.4* 26.0*  MCV 90.5 95.2  PLT 221 680   Basic Metabolic Panel: Recent Labs  Lab 09/30/20 0818 10/02/20 1747  NA 142 137  K 3.8 4.9  CL 104 100  CO2 31 26  GLUCOSE 103* 247*  BUN 15 82*  CREATININE 1.38* 1.70*  CALCIUM 9.2 8.5*   GFR: Estimated Creatinine Clearance: 41.5 mL/min (A) (by C-G formula based on SCr of 1.7 mg/dL (H)). Liver Function Tests: Recent Labs  Lab 09/30/20 0818 10/02/20 1747  AST 17 29  ALT 13 20  ALKPHOS 95 60  BILITOT 0.3 0.4  PROT 6.1* 5.2*  ALBUMIN 2.6* 2.6*   Recent Labs  Lab 10/02/20 1747  LIPASE 28   No results for input(s): AMMONIA in the last 168 hours. Coagulation Profile: No results for input(s): INR, PROTIME in the last 168 hours. Cardiac Enzymes: No results for input(s): CKTOTAL, CKMB, CKMBINDEX, TROPONINI in the last 168 hours. BNP (last 3 results) No results for  input(s): PROBNP in the last 8760 hours. HbA1C: No results for input(s): HGBA1C in the last 72 hours. CBG: No results for input(s): GLUCAP in the last 168 hours. Lipid Profile: No results for input(s): CHOL, HDL, LDLCALC, TRIG, CHOLHDL, LDLDIRECT in the last 72 hours. Thyroid Function Tests: Recent Labs    09/30/20 0817  TSH 2.933   Anemia Panel: No results for input(s): VITAMINB12, FOLATE, FERRITIN, TIBC, IRON, RETICCTPCT in the last 72 hours. Urine analysis:      Component Value Date/Time   COLORURINE YELLOW 09/19/2020 1935   APPEARANCEUR CLEAR 09/19/2020 1935   LABSPEC 1.008 09/19/2020 1935   PHURINE 7.0 09/19/2020 1935   GLUCOSEU NEGATIVE 09/19/2020 1935   GLUCOSEU NEGATIVE 08/29/2019 0959   HGBUR NEGATIVE 09/19/2020 1935   BILIRUBINUR NEGATIVE 09/19/2020 1935   KETONESUR NEGATIVE 09/19/2020 1935   PROTEINUR NEGATIVE 09/19/2020 1935   UROBILINOGEN 0.2 08/29/2019 0959   NITRITE NEGATIVE 09/19/2020 1935   LEUKOCYTESUR NEGATIVE 09/19/2020 1935   Sepsis Labs: '@LABRCNTIP' (procalcitonin:4,lacticidven:4) ) Recent Results (from the past 240 hour(s))  Respiratory Panel by RT PCR (Flu A&B, Covid) - Nasopharyngeal Swab     Status: None   Collection Time: 10/02/20  5:57 PM   Specimen: Nasopharyngeal Swab  Result Value Ref Range Status   SARS Coronavirus 2 by RT PCR NEGATIVE NEGATIVE Final    Comment: (NOTE) SARS-CoV-2 target nucleic acids are NOT DETECTED.  The SARS-CoV-2 RNA is generally detectable in upper respiratoy specimens during the acute phase of infection. The lowest concentration of SARS-CoV-2 viral copies this assay can detect is 131 copies/mL. A negative result does not preclude SARS-Cov-2 infection and should not be used as the sole basis for treatment or other patient management decisions. A negative result may occur with  improper specimen collection/handling, submission of specimen other than nasopharyngeal swab, presence of viral mutation(s) within the areas targeted by this assay, and inadequate number of viral copies (<131 copies/mL). A negative result must be combined with clinical observations, patient history, and epidemiological information. The expected result is Negative.  Fact Sheet for Patients:  PinkCheek.be  Fact Sheet for Healthcare Providers:  GravelBags.it  This test is no t yet approved or cleared by the [place] FDA and  has been authorized  for detection and/or diagnosis of SARS-CoV-2 by FDA under an Emergency Use Authorization (EUA). This EUA will remain  in effect (meaning this test can be used) for the duration of the COVID-19 declaration under Section 564(b)(1) of the Act, 21 U.S.C. section 360bbb-3(b)(1), unless the authorization is terminated or revoked sooner.     Influenza A by PCR NEGATIVE NEGATIVE Final   Influenza B by PCR NEGATIVE NEGATIVE Final    Comment: (NOTE) The Xpert Xpress SARS-CoV-2/FLU/RSV assay is intended as an aid in  the diagnosis of influenza from Nasopharyngeal swab specimens and  should not be used as a sole basis for treatment. Nasal washings and  aspirates are unacceptable for Xpert Xpress SARS-CoV-2/FLU/RSV  testing.  Fact Sheet for Patients: PinkCheek.be  Fact Sheet for Healthcare Providers: GravelBags.it  This test is not yet approved or cleared by the [place] FDA and  has been authorized for detection and/or diagnosis of SARS-CoV-2 by  FDA under an Emergency Use Authorization (EUA). This EUA will remain  in effect (meaning this test can be used) for the duration of the  Covid-19 declaration under Section 564(b)(1) of the Act, 21  U.S.C. section 360bbb-3(b)(1), unless the authorization is  terminated or revoked. Performed at [facility],  [place] [address] [place], [place] [id number]      Radiological Exams on Admission: DG Chest Portable 1 View  Result Date: 10/02/2020 CLINICAL DATA:  History of known lung cancer with weakness, initial encounter EXAM: PORTABLE CHEST 1 VIEW COMPARISON:  PET-CT from 08/22/2020 FINDINGS: Cardiac shadow is stable. Postsurgical changes are again seen. Bilateral soft tissue lung masses are noted consistent with the given clinical history. Elevation the right hemidiaphragm is again noted. No sizable effusion or pneumothorax is seen. No acute bony abnormality is noted.  IMPRESSION: Bilateral masses consistent with the given clinical history of lung carcinoma. No acute abnormality noted. Electronically Signed   By: Inez Catalina M.D.   On: 10/02/2020 17:51      Assessment/Plan Active Problems:   Diabetes 1.5, managed as type 1 ([place])   CAD S/P percutaneous coronary angioplasty   Aortic valve stenosis   Acute renal failure (HCC)   Chronic diastolic congestive heart failure (HCC)   Chronic kidney disease (CKD), stage III (moderate) (HCC)   Essential hypertension   Adenocarcinoma of right lung, stage 4 (HCC)   ARF (acute renal failure) ([place])     #1 acute on chronic kidney disease stage III: Patient has known history of chronic kidney disease stage III.  Currently dehydrated as a result of chemo.  Wife is giving him some fluids but appears to be dehydrated.  We will admit the patient aggressively hydrate.  Consult oncology in the morning.  #2 dehydration: Aggressively hydrate.  Hold his Lasix.  #3 lung cancer: Currently on chemotherapy.  Will consult oncology in the morning.  #4 diastolic heart failure: Patient at this point is dehydrated and will require fluid resuscitation.  Will be careful about the amount of fluid down the road.  #5 coronary artery disease: Stable.  No acute decompensation  #6 aortic valve sclerosis: Stable  #7 anorexia: Patient has significant anorexia and nausea.  Symptomatically treat.   DVT prophylaxis: Eliquis Code Status: Full code Family Communication: Wife at bedside Disposition Plan: Home Consults called: Dr. Earlie Server in the morning Admission status: Inpatient  Severity of Illness: The appropriate patient status for this patient is INPATIENT. Inpatient status is judged to be reasonable and necessary in order to provide the required intensity of service to ensure the patient's safety. The patient's presenting symptoms, physical exam findings, and initial radiographic and laboratory data in the context of their chronic  comorbidities is felt to place them at high risk for further clinical deterioration. Furthermore, it is not anticipated that the patient will be medically stable for discharge from the hospital within 2 midnights of admission. The following factors support the patient status of inpatient.   " The patient's presenting symptoms include weakness and dehydration. " The worrisome physical exam findings include dry mucous membrane weakness. " The initial radiographic and laboratory data are worrisome because of worsening renal function. " The chronic co-morbidities include lung cancer.   * I certify that at the point of admission it is my clinical judgment that the patient will require inpatient hospital care spanning beyond 2 midnights from the point of admission due to high intensity of service, high risk for further deterioration and high frequency of surveillance required.Barbette Merino MD [facility] Pager [phone]  If 7PM-7AM, please contact night-coverage [email or web address] Password [email or web address]  10/02/2020, 8:50 PM

## 2020-10-02 NOTE — ED Triage Notes (Addendum)
PT BIBA from home-  Per EMS- Pt reports feeling weak, lowering himself to floor.  Wife reports pt became unresponsive for "a couple of minutes." Pt had chemo on Monday. Pt AOx4.    EMS reports O2 sats 92% on RA, 100% on 4L [place].  Pt scheduled to have port placed tomorrow.

## 2020-10-02 NOTE — Telephone Encounter (Signed)
?   Compazine adverse reaction- "Weak and shaky one hour after first dose of compazine . He has never had compazine until today.  It helped with nausea".  Rod Can came and got him back in bed. He is resting now. I told Mickel Baas to hold Compazine , keep pt in bed and assist him to BR . If he has the symptoms again or falls to call 911.   Blood glucose was 195 today .  It  was 335 Monday after treatment .

## 2020-10-02 NOTE — Telephone Encounter (Signed)
"  VM-Leeandre got worse he was unresponsive. EMS is taking him to the  Hospital"

## 2020-10-02 NOTE — ED Notes (Addendum)
Pt O2 sats noted to be 86% on RA.  Pt placed on 2L O2 via [place], O2 sats increased to 100%.  Good waveform noted.

## 2020-10-03 ENCOUNTER — Ambulatory Visit (HOSPITAL_COMMUNITY): Admission: RE | Admit: 2020-10-03 | Payer: Medicare Other | Source: Ambulatory Visit

## 2020-10-03 DIAGNOSIS — C3491 Malignant neoplasm of unspecified part of right bronchus or lung: Secondary | ICD-10-CM

## 2020-10-03 DIAGNOSIS — E139 Other specified diabetes mellitus without complications: Secondary | ICD-10-CM

## 2020-10-03 DIAGNOSIS — I2510 Atherosclerotic heart disease of native coronary artery without angina pectoris: Secondary | ICD-10-CM | POA: Diagnosis not present

## 2020-10-03 DIAGNOSIS — N1831 Chronic kidney disease, stage 3a: Secondary | ICD-10-CM

## 2020-10-03 DIAGNOSIS — Z9861 Coronary angioplasty status: Secondary | ICD-10-CM

## 2020-10-03 DIAGNOSIS — I5032 Chronic diastolic (congestive) heart failure: Secondary | ICD-10-CM | POA: Diagnosis not present

## 2020-10-03 DIAGNOSIS — I350 Nonrheumatic aortic (valve) stenosis: Secondary | ICD-10-CM | POA: Diagnosis not present

## 2020-10-03 DIAGNOSIS — I10 Essential (primary) hypertension: Secondary | ICD-10-CM

## 2020-10-03 LAB — COMPREHENSIVE METABOLIC PANEL
ALT: 19 U/L (ref 0–44)
AST: 30 U/L (ref 15–41)
Albumin: 2.6 g/dL — ABNORMAL LOW (ref 3.5–5.0)
Alkaline Phosphatase: 54 U/L (ref 38–126)
Anion gap: 12 (ref 5–15)
BUN: 78 mg/dL — ABNORMAL HIGH (ref 8–23)
CO2: 23 mmol/L (ref 22–32)
Calcium: 8.1 mg/dL — ABNORMAL LOW (ref 8.9–10.3)
Chloride: 105 mmol/L (ref 98–111)
Creatinine, Ser: 1.61 mg/dL — ABNORMAL HIGH (ref 0.61–1.24)
GFR, Estimated: 43 mL/min — ABNORMAL LOW (ref >60)
Glucose, Bld: 163 mg/dL — ABNORMAL HIGH (ref 70–99)
Potassium: 4.2 mmol/L (ref 3.5–5.1)
Sodium: 140 mmol/L (ref 135–145)
Total Bilirubin: 0.4 mg/dL (ref 0.3–1.2)
Total Protein: 5.2 g/dL — ABNORMAL LOW (ref 6.5–8.1)

## 2020-10-03 LAB — CBC WITH DIFFERENTIAL/PLATELET
Abs Immature Granulocytes: 0.11 10*3/uL — ABNORMAL HIGH (ref 0.00–0.07)
Basophils Absolute: 0 10*3/uL (ref 0.0–0.1)
Basophils Relative: 0 %
Eosinophils Absolute: 0 10*3/uL (ref 0.0–0.5)
Eosinophils Relative: 0 %
HCT: 23 % — ABNORMAL LOW (ref 39.0–52.0)
Hemoglobin: 7.5 g/dL — ABNORMAL LOW (ref 13.0–17.0)
Immature Granulocytes: 1 %
Lymphocytes Relative: 15 %
Lymphs Abs: 1.5 10*3/uL (ref 0.7–4.0)
MCH: 30.5 pg (ref 26.0–34.0)
MCHC: 32.6 g/dL (ref 30.0–36.0)
MCV: 93.5 fL (ref 80.0–100.0)
Monocytes Absolute: 0.5 10*3/uL (ref 0.1–1.0)
Monocytes Relative: 5 %
Neutro Abs: 8 10*3/uL — ABNORMAL HIGH (ref 1.7–7.7)
Neutrophils Relative %: 79 %
Platelets: 233 10*3/uL (ref 150–400)
RBC: 2.46 MIL/uL — ABNORMAL LOW (ref 4.22–5.81)
RDW: 15.8 % — ABNORMAL HIGH (ref 11.5–15.5)
WBC: 10.1 10*3/uL (ref 4.0–10.5)
nRBC: 0 % (ref 0.0–0.2)

## 2020-10-03 LAB — GLUCOSE, CAPILLARY
Glucose-Capillary: 129 mg/dL — ABNORMAL HIGH (ref 70–99)
Glucose-Capillary: 164 mg/dL — ABNORMAL HIGH (ref 70–99)
Glucose-Capillary: 170 mg/dL — ABNORMAL HIGH (ref 70–99)
Glucose-Capillary: 214 mg/dL — ABNORMAL HIGH (ref 70–99)

## 2020-10-03 LAB — MAGNESIUM: Magnesium: 2.1 mg/dL (ref 1.7–2.4)

## 2020-10-03 LAB — PHOSPHORUS: Phosphorus: 4.4 mg/dL (ref 2.5–4.6)

## 2020-10-03 MED ORDER — FOLIC ACID 1 MG PO TABS
1.0000 mg | ORAL_TABLET | Freq: Every day | ORAL | Status: DC
  Administered 2020-10-03 – 2020-10-08 (×6): 1 mg via ORAL
  Filled 2020-10-03 (×6): qty 1

## 2020-10-03 MED ORDER — INSULIN ASPART 100 UNIT/ML ~~LOC~~ SOLN
0.0000 [IU] | Freq: Three times a day (TID) | SUBCUTANEOUS | Status: DC
  Administered 2020-10-04: 3 [IU] via SUBCUTANEOUS
  Administered 2020-10-04: 2 [IU] via SUBCUTANEOUS
  Administered 2020-10-04 – 2020-10-05 (×2): 3 [IU] via SUBCUTANEOUS

## 2020-10-03 MED ORDER — INSULIN GLARGINE 100 UNIT/ML ~~LOC~~ SOLN
46.0000 [IU] | Freq: Every day | SUBCUTANEOUS | Status: DC
  Administered 2020-10-03 – 2020-10-05 (×3): 46 [IU] via SUBCUTANEOUS
  Filled 2020-10-03 (×3): qty 0.46

## 2020-10-03 MED ORDER — VITAMIN B-12 1000 MCG PO TABS
1000.0000 ug | ORAL_TABLET | Freq: Every day | ORAL | Status: DC
  Administered 2020-10-03 – 2020-10-08 (×6): 1000 ug via ORAL
  Filled 2020-10-03 (×6): qty 1

## 2020-10-03 MED ORDER — GLUCERNA SHAKE PO LIQD
237.0000 mL | Freq: Three times a day (TID) | ORAL | Status: DC
  Administered 2020-10-03 – 2020-10-06 (×4): 237 mL via ORAL
  Filled 2020-10-03 (×17): qty 237

## 2020-10-03 MED ORDER — METOPROLOL SUCCINATE ER 25 MG PO TB24
25.0000 mg | ORAL_TABLET | Freq: Every day | ORAL | Status: DC
  Administered 2020-10-04 – 2020-10-08 (×4): 25 mg via ORAL
  Filled 2020-10-03 (×6): qty 1

## 2020-10-03 MED ORDER — SODIUM CHLORIDE 0.9 % IV SOLN: INTRAVENOUS | Status: AC

## 2020-10-03 MED ORDER — ROSUVASTATIN CALCIUM 20 MG PO TABS
20.0000 mg | ORAL_TABLET | Freq: Every day | ORAL | Status: DC
  Administered 2020-10-04 – 2020-10-08 (×5): 20 mg via ORAL
  Filled 2020-10-03 (×5): qty 1

## 2020-10-03 MED ORDER — LEVOTHYROXINE SODIUM 75 MCG PO TABS
75.0000 ug | ORAL_TABLET | Freq: Every day | ORAL | Status: DC
  Administered 2020-10-04 – 2020-10-08 (×4): 75 ug via ORAL
  Filled 2020-10-03 (×4): qty 1

## 2020-10-03 MED ORDER — INSULIN ASPART 100 UNIT/ML ~~LOC~~ SOLN
0.0000 [IU] | Freq: Every day | SUBCUTANEOUS | Status: DC
  Administered 2020-10-03: 3 [IU] via SUBCUTANEOUS
  Administered 2020-10-05: 2 [IU] via SUBCUTANEOUS

## 2020-10-03 MED ORDER — ALBUTEROL SULFATE HFA 108 (90 BASE) MCG/ACT IN AERS
2.0000 | INHALATION_SPRAY | Freq: Four times a day (QID) | RESPIRATORY_TRACT | Status: DC | PRN
  Filled 2020-10-03: qty 6.7

## 2020-10-03 MED ORDER — DONEPEZIL HCL 10 MG PO TABS
10.0000 mg | ORAL_TABLET | Freq: Every day | ORAL | Status: DC
  Administered 2020-10-03 – 2020-10-07 (×5): 10 mg via ORAL
  Filled 2020-10-03 (×5): qty 1

## 2020-10-03 NOTE — Evaluation (Signed)
Physical Therapy Evaluation Patient Details Name: Randall Wilkins MRN: [id number] DOB: 1941/04/28 Today's Date: 10/03/2020   History of Present Illness  Patient is 79 y.o. 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 Pt presented to [facility] via EMS after feeling weak, nauseaous at home. Pt's wife reports she helped him slowly lower to the ground on his knees and called for EMS as he briefly lost consciousness. Pt admitted for AKI.   Clinical Impression  Randall Wilkins is 79 y.o. male admitted with above HPI and diagnosis. Patient is currently limited by functional impairments below (see PT problem list). Patient lives with his wife and using RW for household mobility and requires assist for ADL's and some transfers at baseline. Patient currently requires min assist for transfers with use of RW and was easily fatigued. HR elevated from 80's to 110's with sit<>stand transfer and SpO2 dropped to 88% on RA, increased to 96% on 2L/min while sitting in recliner. Patient will benefit from continued skilled PT interventions to address impairments and progress independence with mobility, recommending HHPT. Acute PT will follow and progress as able.       10/03/20 1100  PT Visit Information  Last PT Received On 10/03/20  Assistance Needed +1 (chair follow for gait)  History of Present Illness Patient is 79 y.o. 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 Pt presented to [facility] via EMS after feeling weak, nauseaous at home. Pt's wife reports she helped him slowly lower to the ground on his knees and called for EMS as he briefly lost consciousness. Pt admitted for AKI.  Precautions  Precautions Fall  Restrictions  Weight Bearing Restrictions No  Home Living  Family/patient expects to be discharged to: Private residence  Living Arrangements  Spouse/significant other;Children  Available Help at Discharge Family  Type of [place] One level  Bathroom Shower/Tub Walk-in shower  [place] bars - tub/shower;Walker - 2 wheels;Shower seat;Hand held Tourist information centre manager - 4 wheels;Wheelchair - power;Electric scooter  Prior Function  Level of Independence Independent with assistive device(s);Needs assistance  Gait / Transfers Assistance Needed pt hase AFO for Lt LE and ambulated short household distances with RW. he has an Transport planner and power wheelchair for [place] distances.   ADL's / Homemaking Assistance Needed pt can dress upper body but requires assist from wife to dress lower body. he needs assist to get into/out of shower but can bathe self while sitting on chair with hand helpd showerhead.  Communication / Swallowing Assistance Needed HOH  Comments pt and wife report increased weakness over the last couple months  Communication  Communication HOH  Pain Assessment  Pain Assessment No/denies pain  Cognition  Arousal/Alertness Awake/alert  Behavior During Therapy WFL for tasks assessed/performed  Overall Cognitive Status Within Functional Limits for tasks assessed  Upper Extremity Assessment  Upper Extremity Assessment Defer to OT evaluation;Generalized weakness  Lower Extremity Assessment  Lower Extremity Assessment Generalized weakness  Cervical / Trunk Assessment  Cervical / Trunk Assessment Kyphotic  Bed Mobility  Overal bed mobility Needs Assistance  Bed Mobility Supine to Sit  Supine to sit [facility] elevated;Min assist  General bed mobility comments cues for use of bed rail, pt waiting for help to sit up despite being able to do so  independently per NT staff. Pt required min assist to raise trunk fully and cues to bring LE's off EOB.   Transfers  Overall transfer level Needs assistance   Equipment used Rolling walker (2 wheeled)  Transfers Stand Pivot Transfers;Sit to/from Stand  Sit to [employer] assist  Stand pivot transfers Min assist;Mod assist  General transfer comment cues for hand placement on RW for power up. Pt required min assist to rise and steady with sit<>stand from EOB. Min-Mod assist with verbal cues to sequence side steps and forward steps for stand pivot transfer to recliner. Assist for walker position as well.   Balance  Overall balance assessment Needs assistance  Sitting-balance support Feet supported  Sitting balance-Leahy Scale Fair  Standing balance support Bilateral upper extremity supported;During functional activity  Standing balance-Leahy Scale Poor  Standing balance comment reliant on external support for stability  PT - End of Session  Equipment Utilized During Treatment Gait belt  Activity Tolerance Patient tolerated treatment well  Patient left in chair;with call bell/phone within reach;with chair alarm set;with family/visitor present  Nurse Communication Mobility status  PT Assessment  PT Recommendation/Assessment Patient needs continued PT services  PT Visit Diagnosis Muscle weakness (generalized) (M62.81);Other abnormalities of gait and mobility (R26.89);Unsteadiness on feet (R26.81);Difficulty in walking, not elsewhere classified (R26.2)  PT Problem List Decreased strength;Decreased activity tolerance;Decreased balance;Decreased mobility;Decreased knowledge of use of DME;Decreased knowledge of precautions;Decreased safety awareness  PT Plan  PT Frequency (ACUTE ONLY) Min 3X/week  PT Treatment/Interventions (ACUTE ONLY) DME instruction;Gait training;Functional mobility training;Therapeutic activities;Therapeutic exercise;Balance training;Patient/family education  AM-PAC PT "6 Clicks" Mobility Outcome Measure (Version 2)  Help needed turning from your back to your side while in a flat bed without using bedrails? 3  Help needed moving from  lying on your back to sitting on the side of a flat bed without using bedrails? 3  Help needed moving to and from a bed to a chair (including a wheelchair)? 3  Help needed standing up from a chair using your arms (e.g., wheelchair or bedside chair)? 3  Help needed to walk in hospital room? 3  Help needed climbing 3-5 steps with a railing?  2  6 Click Score 17  Consider Recommendation of Discharge To: Home with [facility]  PT Recommendation  Recommendations for Other Services OT consult  Follow Up Recommendations Home health PT  PT equipment None recommended by PT  Individuals Consulted  Consulted and Agree with Results and Recommendations Patient;Family member/caregiver  Family Member Consulted pt's wife  Acute Rehab PT Goals  Patient Stated Goal return home and improve strength/mobility  PT Goal Formulation With patient/family  Time For Goal Achievement 10/17/20  Potential to Achieve Goals Good  PT Time Calculation  PT Start Time (ACUTE ONLY) 1126 1202  PT Stop Time (ACUTE ONLY) 1134 1229  PT Time Calculation (min) (ACUTE ONLY) 8 min 27 min (sesison split by IV team arrival, break to allow IV insertion and then resumed evaluation)  PT General Charges  $$ ACUTE PT VISIT 1 Visit  PT Evaluation  $PT Eval Low Complexity 1 Low  PT Treatments  $Therapeutic Activity 8-22 mins  Written Expression  Dominant Hand Right     Verner Mould, DPT Acute Rehabilitation Services  Office [phone] Pager [phone]  10/03/2020 3:57 PM

## 2020-10-03 NOTE — Progress Notes (Signed)
Initial Nutrition Assessment  DOCUMENTATION CODES:   Obesity unspecified  INTERVENTION:   -Glucerna Shake po TID, each supplement provides 220 kcal and 10 grams of protein  NUTRITION DIAGNOSIS:   Increased nutrient needs related to cancer and cancer related treatments as evidenced by estimated needs.  GOAL:   Patient will meet greater than or equal to 90% of their needs  MONITOR:   PO intake, Supplement acceptance, Labs, Weight trends, I & O's  REASON FOR ASSESSMENT:   Malnutrition Screening Tool    ASSESSMENT:   79 y.o. male with medical history significant of hypertension, diabetes, aortic stenosis, hypothyroidism, coronary artery disease status post PCI, chronic anticoagulation and stage IV lung cancer currently on chemo whose last chemo was 2 days ago that came to the ER with weakness shortness of breath as well as nausea.  Patient in room receiving patient care at time of visit.  Per chart review, pt with poor appetite which started following chemotherapy treatment on 10/18. Pt was having nausea PTA.  Pt not eating since diet was advanced at lunchtime. Will order Glucerna shakes for added protein.   Per weight records, pt has lost 15 lbs since 5/17 (5% wt loss x 5 months, insignificant for time frame).   Medications: Folic acid, Vitamin R-94 Labs reviewed: CBGs: 129-164  NUTRITION - FOCUSED PHYSICAL EXAM:  Unable to complete  Diet Order:   Diet Order            Diet heart healthy/carb modified Room service appropriate? Yes; Fluid consistency: Thin  Diet effective now                 EDUCATION NEEDS:   No education needs have been identified at this time  Skin:  Skin Assessment: Reviewed RN Assessment  Last BM:  10/21 -type 4  Height:   Ht Readings from Last 1 Encounters:  10/02/20 5\' 7"  (1.702 m)    Weight:   Wt Readings from Last 1 Encounters:  10/02/20 108.9 kg   BMI:  Body mass index is 37.59 kg/m.  Estimated Nutritional Needs:    Kcal:  5859-2924  Protein:  85-100g  Fluid:  2.1L/day  Clayton Bibles, MS, RD, LDN Inpatient Clinical Dietitian Contact information available via Amion

## 2020-10-03 NOTE — Progress Notes (Signed)
PROGRESS NOTE    Randall Wilkins  UEA:[id number] DOB: 05-03-1941 DOA: 10/02/2020 PCP: [name], MD   Brief Narrative:  HPI per Dr. Gala Romney on 10/02/20 Randall Wilkins is a 79 y.o. male with medical history significant of hypertension, diabetes, aortic stenosis, hypothyroidism, coronary artery disease status post PCI, chronic anticoagulation and stage IV lung cancer currently on chemo whose last chemo was 2 days ago that came to the ER with weakness shortness of breath as well as nausea.  Patient did get his chemo on Monday was feeling better at that time.  He went home but then started feeling weak and tired.  This morning he woke up with significant nausea.  He took some nausea medication and then suddenly became weak debilitated and not himself.  He was slightly confused so wife brought him to the ER where he was evaluated.  He was found to be significantly dehydrated.  Has acute on chronic kidney disease as well as elevated blood sugar.  Patient therefore being admitted to the hospital for evaluation.  ED Course: Temperature 98.6 blood pressure 130/54 pulse 90 respirate of 20 oxygen sat 90% on room air.  White count is 14 point hemoglobin 8.4 platelet count of 292.  Chemistry showed BUN of 82 creatinine 1.70 and calcium 8.5.  Glucose is 247.  Troponin is 55 and 52.  COVID-19 screen is negative.  Chest x-ray shows bilateral masses consistent with lung cancer.  Patient being admitted for further evaluation and treatment.  **Interim History  Patient states he is feeling a little bit better today but his renal function still elevated. We will continue him fluid hydration and obtain a PT and OT evaluation given his weakness.   Assessment & Plan:   Active Problems:   Diabetes 1.5, managed as type 1 ([place])   CAD S/P percutaneous coronary angioplasty   Aortic valve stenosis   Acute renal failure (HCC)   Chronic diastolic congestive heart failure (HCC)   Chronic kidney disease (CKD),  stage III (moderate) (HCC)   Essential hypertension   Adenocarcinoma of right lung, stage 4 (HCC)   ARF (acute renal failure) (HCC)  Acute on chronic kidney disease stage IIIa  -Patient has known history of chronic kidney disease stage IIIa.  -Currently dehydrated as a result of chemo and diuretics. -Will need to Clarify Diuretics as he has Furosemide and Torsemide listed both on his MAR; see below -Wife is giving him some fluids but appears to be dehydrated. -Initially given aggressive IV fluid hydration with normal saline at 125 mL's per hour but will decrease rate to 75 MLS per hour for 1 day  -BUN/creatinine on 09/30/2020 was 15/1.38 and slightly worsened yesterday to 82/1.70; now slowly improving and today is 78/1.61 Plan continue IV fluid hydration with normal saline 25 MLS per hour -Avoid nephrotoxic medications and will hold his furosemide/torsemide, lisinopril, allopurinol, Metformin, and tamsulosin -Avoid contrast dyes, and renally adjust medications -Continue to monitor and trend renal function panel and repeat CMP in a.m.  Dehydration -Initially started on aggressive hydration and will hold his furosemide/torsemide; apparently the patient has been taking 80 mg of furosemide in the morning and 10 mg of torsemide in the evening without patient's PCP not.  He is only prescribed furosemide 81 p.o. 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 daily before breakfast  History of gout -Currently holding  allopurinol 300 mg p.o. daily given his worsened renal function  Chronic Diastolic CHF  -BNP on admission was 344.4 -Currently appears dehydrated and was given IV fluid resuscitation and have change the rate from 125 MLS per hour to 75 MLS per hour for 1 day -We will hold his furosemide and discontinue his torsemide -Continue with metoprolol succinate 25 mg p.o. daily and will hold his lisinopril 10 mg p.o. daily for now -Strict I's and O's and daily weights We will continue to monitor for signs and symptoms of volume overload and repeat a chest x-ray in a.m.  Insulin-Dependent Diabetes Mellitus Type 2 complicated by  -Resume home Tresiba 46 units SQ nightly and hold his home Metformin and Trulicity as well as a sliding scale insulin -We will start on a moderate NovoLog/scale insulin before meals and at bedtime -Continue to monitor CBGs carefully -As hemoglobin A1c was 6.4 on 05/20/2020  History of CAD status post CABG x2 -Resume aspirin 81 mg p.o. daily, metoprolol succinate 25 mg p.o. daily, and will hold his lisinopril tomorrow daily  -resume rosuvastatin 20 mg nightly -Currently denies any chest pain  History of left parietal CVA with small lacunar infarcts -Resume aspirin 81 mg p.o. daily as well as apixaban 5 milligrams p.o. twice daily and simvastatin 20 once daily  Paroxysmal Atrial Fibrillation (New Onset Earlier this year) -CHA2DS2-VASc score of 6 -Continue with apixaban 5 mg p.o. twice daily given his history of prior stroke -Continue telemetry Monitoring' -Continue with metoprolol succinate 25 mg daily  Dyslipidemia/Hyperlipidemia -Continue rosuvastatin 20 mg daily  BPH -Continue to hold his tamsulosin 0.4 mg p.o. daily  Anorexia -Patient has significant anorexia and nausea.   -Symptomatically treat. -We will consult nutritionist for further evaluation recommendation  Obesity -Complicates overall prognosis and care -Estimated body mass index is 37.59 kg/m as  calculated from the following:   Height as of this encounter: 5\' 7"  (1.702 m).   Weight as of this encounter: 108.9 kg. -Weight Loss and Dietary Counseling given   OSA -C/w CPAP nightly   Normocytic Anemia/Anemia secondary to Antineoplastic therapy -Patient's hemoglobin/hematocrit on admission was 8.4/26.0 and then trended down to 7.5/23.0 -check anemia panel in the a.m. -Continue to monitor for signs and symptoms of bleeding; currently no overt bleeding noted -Repeat CBC in a.m.  Leukocytosis -Likely hemoconcentration in the setting of dehydration  -WBC on admission was 14.2 is now 10.1 -Continue to monitor for signs and symptoms of infection next-repeat CBC in a.m  Generalized Weakness -In the setting of dehydration -PT/OT to Evaluate and Treat  DVT prophylaxis: Anticoagulated with Apixaban Code Status: FULL CODE  Family Communication: No family present at bedside  Disposition Plan: Pending further clinical improvement and back to baseline and evaluation by PT OT  Status is: Inpatient  Remains inpatient appropriate because:Unsafe d/c plan, IV treatments appropriate due to intensity of illness or inability to take PO and Inpatient level of care appropriate due to severity of illness   Dispo: The patient is from: Home              Anticipated  d/c is to: TBD              Anticipated d/c date is:1-2 days              Patient currently is not medically stable to d/c.  Consultants:   None   Procedures: None  Antimicrobials:  Anti-infectives (From admission, onward)   None       Subjective: Seen and examined at bedside and states that his feet all bit better but still felt weak. No nausea or vomiting. Denies any chest pain, lightheadedness or dizziness. States that he fell a few times given his weakness but fell to the floor and did not hit his head. He states that he gradually ended up sitting down from his fall. States he hurt his shins. No other concerns or complaints  at this time.  Objective: Vitals:   10/02/20 2300 10/03/20 0001 10/03/20 0408 10/03/20 1347  BP: (!) 167/74 110/62 (!) 134/58 138/66  Pulse: 80 77 86 77  Resp: 13 16 16 18   Temp:  98.2 F (36.8 C)  98.4 F (36.9 C)  TempSrc:  Oral  Oral  SpO2: 98% 99% 92% 94%  Weight:      Height:        Intake/Output Summary (Last 24 hours) at 10/03/2020 1442 Last data filed at 10/03/2020 1347 Gross per 24 hour  Intake 1265.56 ml  Output 1300 ml  Net -34.44 ml   Filed Weights   10/02/20 1640  Weight: 108.9 kg   Examination: Physical Exam:  Constitutional: WN/WD obese Caucasian male currently in no acute distress appears mildly anxious Eyes: Lids and conjunctivae normal, sclerae anicteric  ENMT: External Ears, Nose appear normal. Grossly normal hearing.  Neck: Appears normal, supple, no cervical masses, normal ROM, no appreciable thyromegaly; no JVD Respiratory: Diminished to auscultation bilaterally, no wheezing, rales, rhonchi or crackles. Normal respiratory effort and patient is not tachypenic. No accessory muscle use. Unlabored breathing Cardiovascular: RRR, no murmurs / rubs / gallops. S1 and S2 auscultated. No appreciable extremity edema Abdomen: Soft, non-tender, distended secondary to body habitus. Bowel sounds positive.  GU: Deferred. Musculoskeletal: No clubbing / cyanosis of digits/nails. No joint deformity upper and lower extremities.  Skin: No rashes, lesions, ulcers on limited skin evaluation. No induration; Warm and dry.  Neurologic: CN 2-12 grossly intact with no focal deficits.  Romberg sign and cerebellar reflexes not assessed.  Psychiatric: Normal judgment and insight. Alert and oriented x 3. Slightly anxious mood and appropriate affect.   Data Reviewed: I have personally reviewed following labs and imaging studies  CBC: Recent Labs  Lab 09/30/20 0818 10/02/20 1747 10/03/20 0809  WBC 8.2 14.2* 10.1  NEUTROABS 4.2 11.0* 8.0*  HGB 11.7* 8.4* 7.5*  HCT 36.4*  26.0* 23.0*  MCV 90.5 95.2 93.5  PLT 221 292 081   Basic Metabolic Panel: Recent Labs  Lab 09/30/20 0818 10/02/20 1747 10/03/20 0809  NA 142 137 140  K 3.8 4.9 4.2  CL 104 100 105  CO2 31 26 23   GLUCOSE 103* 247* 163*  BUN 15 82* 78*  CREATININE 1.38* 1.70* 1.61*  CALCIUM 9.2 8.5* 8.1*  MG  --   --  2.1  PHOS  --   --  4.4   GFR: Estimated Creatinine Clearance: 43.8 mL/min (A) (by C-G formula based on SCr of 1.61 mg/dL (H)). Liver Function Tests: Recent Labs  Lab 09/30/20 0818 10/02/20 1747 10/03/20 0809  AST 17 29 30   ALT 13 20 19  ALKPHOS 95 60 54  BILITOT 0.3 0.4 0.4  PROT 6.1* 5.2* 5.2*  ALBUMIN 2.6* 2.6* 2.6*   Recent Labs  Lab 10/02/20 1747  LIPASE 28   No results for input(s): AMMONIA in the last 168 hours. Coagulation Profile: No results for input(s): INR, PROTIME in the last 168 hours. Cardiac Enzymes: No results for input(s): CKTOTAL, CKMB, CKMBINDEX, TROPONINI in the last 168 hours. BNP (last 3 results) No results for input(s): PROBNP in the last 8760 hours. HbA1C: No results for input(s): HGBA1C in the last 72 hours. CBG: Recent Labs  Lab 10/03/20 0029 10/03/20 0820 10/03/20 1309  GLUCAP 170* 164* 129*   Lipid Profile: No results for input(s): CHOL, HDL, LDLCALC, TRIG, CHOLHDL, LDLDIRECT in the last 72 hours. Thyroid Function Tests: No results for input(s): TSH, T4TOTAL, FREET4, T3FREE, THYROIDAB in the last 72 hours. Anemia Panel: No results for input(s): VITAMINB12, FOLATE, FERRITIN, TIBC, IRON, RETICCTPCT in the last 72 hours. Sepsis Labs: No results for input(s): PROCALCITON, LATICACIDVEN in the last 168 hours.  Recent Results (from the past 240 hour(s))  Respiratory Panel by RT PCR (Flu A&B, Covid) - Nasopharyngeal Swab     Status: None   Collection Time: 10/02/20  5:57 PM   Specimen: Nasopharyngeal Swab  Result Value Ref Range Status   SARS Coronavirus 2 by RT PCR NEGATIVE NEGATIVE Final    Comment: (NOTE) SARS-CoV-2 target  nucleic acids are NOT DETECTED.  The SARS-CoV-2 RNA is generally detectable in upper respiratoy specimens during the acute phase of infection. The lowest concentration of SARS-CoV-2 viral copies this assay can detect is 131 copies/mL. A negative result does not preclude SARS-Cov-2 infection and should not be used as the sole basis for treatment or other patient management decisions. A negative result may occur with  improper specimen collection/handling, submission of specimen other than nasopharyngeal swab, presence of viral mutation(s) within the areas targeted by this assay, and inadequate number of viral copies (<131 copies/mL). A negative result must be combined with clinical observations, patient history, and epidemiological information. The expected result is Negative.  Fact Sheet for Patients:  PinkCheek.be  Fact Sheet for Healthcare Providers:  GravelBags.it  This test is no t yet approved or cleared by the [place] FDA and  has been authorized for detection and/or diagnosis of SARS-CoV-2 by FDA under an Emergency Use Authorization (EUA). This EUA will remain  in effect (meaning this test can be used) for the duration of the COVID-19 declaration under Section 564(b)(1) of the Act, 21 U.S.C. section 360bbb-3(b)(1), unless the authorization is terminated or revoked sooner.     Influenza A by PCR NEGATIVE NEGATIVE Final   Influenza B by PCR NEGATIVE NEGATIVE Final    Comment: (NOTE) The Xpert Xpress SARS-CoV-2/FLU/RSV assay is intended as an aid in  the diagnosis of influenza from Nasopharyngeal swab specimens and  should not be used as a sole basis for treatment. Nasal washings and  aspirates are unacceptable for Xpert Xpress SARS-CoV-2/FLU/RSV  testing.  Fact Sheet for Patients: PinkCheek.be  Fact Sheet for Healthcare  Providers: GravelBags.it  This test is not yet approved or cleared by the [place] FDA and  has been authorized for detection and/or diagnosis of SARS-CoV-2 by  FDA under an Emergency Use Authorization (EUA). This EUA will remain  in effect (meaning this test can be used) for the duration of the  Covid-19 declaration under Section 564(b)(1) of the Act, 21  U.S.C. section 360bbb-3(b)(1), unless the authorization is  terminated or  revoked. Performed at [facility], [place] [address], [place], [place] [address]     RN Pressure Injury Documentation:     Estimated body mass index is 37.59 kg/m as calculated from the following:   Height as of this encounter: 5\' 7"  (1.702 m).   Weight as of this encounter: 108.9 kg.  Malnutrition Type:      Malnutrition Characteristics:      Nutrition Interventions:    Radiology Studies: DG Chest Portable 1 View  Result Date: 10/02/2020 CLINICAL DATA:  History of known lung cancer with weakness, initial encounter EXAM: PORTABLE CHEST 1 VIEW COMPARISON:  PET-CT from 08/22/2020 FINDINGS: Cardiac shadow is stable. Postsurgical changes are again seen. Bilateral soft tissue lung masses are noted consistent with the given clinical history. Elevation the right hemidiaphragm is again noted. No sizable effusion or pneumothorax is seen. No acute bony abnormality is noted. IMPRESSION: Bilateral masses consistent with the given clinical history of lung carcinoma. No acute abnormality noted. Electronically Signed   By: Inez Catalina M.D.   On: 10/02/2020 17:51   Scheduled Meds: . apixaban  5 mg Oral BID  . insulin aspart  0-15 Units Subcutaneous TID WC  . insulin aspart  0-5 Units Subcutaneous QHS  . insulin glargine  46 Units Subcutaneous QHS  . ondansetron (ZOFRAN) IV  4 mg Intravenous Once  . rosuvastatin  20 mg Oral Daily   Continuous Infusions: . sodium chloride 125 mL/hr at 10/03/20 0006    LOS: 1  day   Kerney Elbe, DO [facility] PAGER is on [place]  If 7PM-7AM, please contact night-coverage [email or web address]

## 2020-10-04 DIAGNOSIS — I2510 Atherosclerotic heart disease of native coronary artery without angina pectoris: Secondary | ICD-10-CM | POA: Diagnosis not present

## 2020-10-04 DIAGNOSIS — C3491 Malignant neoplasm of unspecified part of right bronchus or lung: Secondary | ICD-10-CM | POA: Diagnosis not present

## 2020-10-04 DIAGNOSIS — K921 Melena: Secondary | ICD-10-CM

## 2020-10-04 DIAGNOSIS — I5032 Chronic diastolic (congestive) heart failure: Secondary | ICD-10-CM | POA: Diagnosis not present

## 2020-10-04 DIAGNOSIS — I350 Nonrheumatic aortic (valve) stenosis: Secondary | ICD-10-CM | POA: Diagnosis not present

## 2020-10-04 LAB — COMPREHENSIVE METABOLIC PANEL
ALT: 21 U/L (ref 0–44)
AST: 25 U/L (ref 15–41)
Albumin: 2.5 g/dL — ABNORMAL LOW (ref 3.5–5.0)
Alkaline Phosphatase: 53 U/L (ref 38–126)
Anion gap: 7 (ref 5–15)
BUN: 59 mg/dL — ABNORMAL HIGH (ref 8–23)
CO2: 25 mmol/L (ref 22–32)
Calcium: 8.1 mg/dL — ABNORMAL LOW (ref 8.9–10.3)
Chloride: 108 mmol/L (ref 98–111)
Creatinine, Ser: 1.42 mg/dL — ABNORMAL HIGH (ref 0.61–1.24)
GFR, Estimated: 50 mL/min — ABNORMAL LOW (ref >60)
Glucose, Bld: 174 mg/dL — ABNORMAL HIGH (ref 70–99)
Potassium: 4.1 mmol/L (ref 3.5–5.1)
Sodium: 140 mmol/L (ref 135–145)
Total Bilirubin: 0.5 mg/dL (ref 0.3–1.2)
Total Protein: 5 g/dL — ABNORMAL LOW (ref 6.5–8.1)

## 2020-10-04 LAB — CBC WITH DIFFERENTIAL/PLATELET
Abs Immature Granulocytes: 0.06 10*3/uL (ref 0.00–0.07)
Basophils Absolute: 0 10*3/uL (ref 0.0–0.1)
Basophils Relative: 1 %
Eosinophils Absolute: 0 10*3/uL (ref 0.0–0.5)
Eosinophils Relative: 0 %
HCT: 20.2 % — ABNORMAL LOW (ref 39.0–52.0)
Hemoglobin: 6.6 g/dL — CL (ref 13.0–17.0)
Immature Granulocytes: 1 %
Lymphocytes Relative: 15 %
Lymphs Abs: 0.9 10*3/uL (ref 0.7–4.0)
MCH: 30.1 pg (ref 26.0–34.0)
MCHC: 32.7 g/dL (ref 30.0–36.0)
MCV: 92.2 fL (ref 80.0–100.0)
Monocytes Absolute: 0.1 10*3/uL (ref 0.1–1.0)
Monocytes Relative: 2 %
Neutro Abs: 5 10*3/uL (ref 1.7–7.7)
Neutrophils Relative %: 81 %
Platelets: 197 10*3/uL (ref 150–400)
RBC: 2.19 MIL/uL — ABNORMAL LOW (ref 4.22–5.81)
RDW: 15.7 % — ABNORMAL HIGH (ref 11.5–15.5)
WBC: 6 10*3/uL (ref 4.0–10.5)
nRBC: 0 % (ref 0.0–0.2)

## 2020-10-04 LAB — FOLATE: Folate: 22.3 ng/mL (ref >5.9)

## 2020-10-04 LAB — RETICULOCYTES
Immature Retic Fract: 6.1 % (ref 2.3–15.9)
RBC.: 2.16 MIL/uL — ABNORMAL LOW (ref 4.22–5.81)
Retic Count, Absolute: 30.2 10*3/uL (ref 19.0–186.0)
Retic Ct Pct: 1.4 % (ref 0.4–3.1)

## 2020-10-04 LAB — VITAMIN B12: Vitamin B-12: 3201 pg/mL — ABNORMAL HIGH (ref 180–914)

## 2020-10-04 LAB — GLUCOSE, CAPILLARY
Glucose-Capillary: 262 mg/dL — ABNORMAL HIGH (ref 70–99)
Glucose-Capillary: 150 mg/dL — ABNORMAL HIGH (ref 70–99)
Glucose-Capillary: 192 mg/dL — ABNORMAL HIGH (ref 70–99)
Glucose-Capillary: 171 mg/dL — ABNORMAL HIGH (ref 70–99)
Glucose-Capillary: 134 mg/dL — ABNORMAL HIGH (ref 70–99)

## 2020-10-04 LAB — IRON AND TIBC
Iron: 192 ug/dL — ABNORMAL HIGH (ref 45–182)
Saturation Ratios: 92 % — ABNORMAL HIGH (ref 17.9–39.5)
TIBC: 209 ug/dL — ABNORMAL LOW (ref 250–450)
UIBC: 17 ug/dL

## 2020-10-04 LAB — PREPARE RBC (CROSSMATCH)

## 2020-10-04 LAB — FERRITIN: Ferritin: 410 ng/mL — ABNORMAL HIGH (ref 24–336)

## 2020-10-04 LAB — MAGNESIUM: Magnesium: 2.1 mg/dL (ref 1.7–2.4)

## 2020-10-04 LAB — HEMOGLOBIN AND HEMATOCRIT, BLOOD
HCT: 18.4 % — ABNORMAL LOW (ref 39.0–52.0)
Hemoglobin: 6 g/dL — CL (ref 13.0–17.0)

## 2020-10-04 LAB — PHOSPHORUS: Phosphorus: 3.9 mg/dL (ref 2.5–4.6)

## 2020-10-04 MED ORDER — PANTOPRAZOLE SODIUM 40 MG IV SOLR: 40.0000 mg | Freq: Two times a day (BID) | INTRAVENOUS | Status: DC

## 2020-10-04 MED ORDER — SODIUM CHLORIDE 0.9 % IV SOLN
8.0000 mg/h | INTRAVENOUS | Status: DC
  Administered 2020-10-05: 8 mg/h via INTRAVENOUS
  Filled 2020-10-04 (×5): qty 80

## 2020-10-04 MED ORDER — SODIUM CHLORIDE 0.9 % IV SOLN
80.0000 mg | Freq: Once | INTRAVENOUS | Status: AC
  Administered 2020-10-05: 80 mg via INTRAVENOUS
  Filled 2020-10-04 (×2): qty 80

## 2020-10-04 MED ORDER — SODIUM CHLORIDE 0.9% FLUSH
10.0000 mL | Freq: Two times a day (BID) | INTRAVENOUS | Status: DC
  Administered 2020-10-05: 10 mL

## 2020-10-04 MED ORDER — SODIUM CHLORIDE 0.9% IV SOLUTION: Freq: Once | INTRAVENOUS | Status: DC

## 2020-10-04 MED ORDER — SODIUM CHLORIDE 0.9% FLUSH: 10.0000 mL | INTRAVENOUS | Status: DC | PRN

## 2020-10-04 NOTE — Progress Notes (Signed)
PROGRESS NOTE    Randall Wilkins  HBZ:[id number] DOB: [birth year] DOA: 10/02/2020 PCP: [name], MD   Brief Narrative:  HPI per Dr. Gala Romney on 10/02/20 Randall Wilkins is a 79 y.o. male with medical history significant of hypertension, diabetes, aortic stenosis, hypothyroidism, coronary artery disease status post PCI, chronic anticoagulation and stage IV lung cancer currently on chemo whose last chemo was 2 days ago that came to the ER with weakness shortness of breath as well as nausea.  Patient did get his chemo on Monday was feeling better at that time.  He went home but then started feeling weak and tired.  This morning he woke up with significant nausea.  He took some nausea medication and then suddenly became weak debilitated and not himself.  He was slightly confused so wife brought him to the ER where he was evaluated.  He was found to be significantly dehydrated.  Has acute on chronic kidney disease as well as elevated blood sugar.  Patient therefore being admitted to the hospital for evaluation.  ED Course: Temperature 98.6 blood pressure 130/54 pulse 90 respirate of 20 oxygen sat 90% on room air.  White count is 14 point hemoglobin 8.4 platelet count of 292.  Chemistry showed BUN of 82 creatinine 1.70 and calcium 8.5.  Glucose is 247.  Troponin is 55 and 52.  COVID-19 screen is negative.  Chest x-ray shows bilateral masses consistent with lung cancer.  Patient being admitted for further evaluation and treatment.  **Interim History  Patient states he is feeling a little bit better today but his renal function still elevated. We will continue him fluid hydration and obtain a PT and OT evaluation given his weakness.  10/04/20: Overnight patient had dark black tarry stools and his hemoglobin further dropped and is 6.6 today.  Initially thought that he had anemia due to his chemotherapy but now feels like he is actively having a GI bleed.  We will type and screen and transfuse 1  unit of PRBCs and place the patient on Protonix drip and hold his Eliquis and aspirin.  GI has been consulted and FOBT has been ordered.  GI planning on doing EGD tomorrow given the patient ate breakfast today and because there is a lack of anesthesia availability in the endoscopy unit today.  Patient is to be n.p.o. after midnight and will be placed on a clear liquid diet today with his Eliquis being held.   Assessment & Plan:   Active Problems:   Diabetes 1.5, managed as type 1 ([place])   CAD S/P percutaneous coronary angioplasty   Aortic valve stenosis   Acute renal failure (HCC)   Chronic diastolic congestive heart failure (HCC)   Chronic kidney disease (CKD), stage III (moderate) (HCC)   Essential hypertension   Adenocarcinoma of right lung, stage 4 (HCC)   ARF (acute renal failure) (HCC)  Acute on chronic kidney disease stage IIIa , improving and is thought to be dehydration but now likely in setting of GI bleeding and diuretic use -Patient has known history of chronic kidney disease stage IIIa.  -Currently dehydrated as a result of chemo and diuretics. -Will need to Clarify Diuretics as he has Furosemide and Torsemide listed both on his MAR; see below -Wife is giving him some fluids but appears to be dehydrated. -Initially given aggressive IV fluid hydration with normal saline at 125 mL's per hour but will decrease rate to 75 MLS per hour for 1 day  -BUN/creatinine on 09/30/2020 was  15/1.38 and slightly worsened to 82/1.70; now slowly improving and yesterday was 78/1.61 and today is 59/1.42 -Patient was given IV fluid hydration with normal saline at 75 MLS per hour and this is now expired and stopped -Avoid nephrotoxic medications and will hold his furosemide/torsemide, lisinopril, allopurinol, Metformin, and tamsulosin -Avoid contrast dyes, and renally adjust medications -Continue to monitor and trend renal function panel and repeat CMP in a.m.  Dehydration -Initially started on  aggressive hydration and will hold his furosemide/torsemide; apparently the patient has been taking 80 mg of furosemide in the morning and 10 mg of torsemide in the evening without patient's PCP not.  He is only prescribed furosemide 80 mg p.o. 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 daily before breakfast  History of gout -Currently holding allopurinol 300 mg p.o. daily given his worsened renal function  Chronic Diastolic CHF  -BNP on admission was 344.4 -Currently appears dehydrated and was given IV fluid resuscitation and have change the rate from 125 MLS per hour to 75 MLS per hour for 1 day and this is now been stopped and he is getting a unit of blood today -We will hold his furosemide and discontinue his torsemide -Continue with metoprolol succinate 25 mg p.o. daily and will hold his lisinopril 10 mg p.o. daily for now -Strict I's and O's and daily weights We will continue to monitor for signs and symptoms of volume overload and repeat a chest x-ray in a.m.  Insulin-Dependent Diabetes Mellitus Type 2 complicated by  -Resume home Tresiba 46 units SQ nightly and hold his home Metformin and Trulicity as well as a sliding scale insulin -We will start on a moderate NovoLog/scale insulin before meals and at bedtime -Continue to monitor CBGs carefully is ranging  from 129-262 -As hemoglobin A1c was 6.4 on 05/20/2020  History of CAD status post CABG x2 -We will hold his aspirin 81 mg daily due to his GI bleeding and will continue metoprolol succinate 25 mg p.o. daily, and will hold his lisinopril 10 mg daily -resume rosuvastatin 20 mg nightly -Currently denies any chest pain  History of left parietal CVA with small lacunar infarcts -Discontinued aspirin 81 mg p.o. daily as well as apixaban 5 milligrams p.o. twice daily for now given his GI bleeding but will continue simvastatin 20 once daily  Paroxysmal Atrial Fibrillation (New Onset Earlier this year) -CHA2DS2-VASc score of 6 -We will hold apixaban 5 mg p.o. twice daily given that he is having a GI bleed now despite having history of CVA -Continue telemetry Monitoring' -Continue with metoprolol succinate 25 mg daily  Dyslipidemia/Hyperlipidemia -Continue rosuvastatin 20 mg daily  BPH -Continue to hold his tamsulosin 0.4 mg p.o. daily  Anorexia -Patient has significant anorexia and nausea.   -Symptomatically treat. -We will consult nutritionist for further evaluation recommendation  Obesity -Complicates overall prognosis and care -Estimated body mass index is 37.59 kg/m as calculated from the following:   Height as of this encounter: 5\' 7"  (1.702 m).   Weight as of this encounter: 108.9 kg. -Weight Loss and Dietary Counseling given   OSA -C/w CPAP nightly   Normocytic Anemia/Anemia secondary to Antineoplastic therapy/acute blood loss anemia -Patient's hemoglobin/hematocrit on admission was 8.4/26.0 and then trended down to 7.5/23.0 today and today was 6.6/20.2 -Checked anemia panel and showed an iron level of 192, TIBC of 17, TIBC of 209, saturation ratios of 92%, ferritin level 14, folate levels 22.3, and vitamin B12 of 3201 -Initially thought to be from antineoplastic therapy and dilutional drop however patient was having significant melanotic and dark tarry stools  overnight -Because his acute drop in hemoglobin to 6.6 we will type and screen and transfuse 1 unit PRBCs -We will hold his Eliquis and his aspirin -We will start 2 large-bore IVs and place the patient on Protonix drip -We will obtain FOBT -GI consulted for further evaluation recommendations and because the patient ate today and because there is no availability endoscopy unit today his EGD will be postponed until tomorrow -Continue to monitor for signs and symptoms of bleeding; currently having melanotic and dark tarry stools now -Repeat CBC in a.m.  Leukocytosis -Likely hemoconcentration in the setting of dehydration  -WBC on admission was 14.2 is now 10.1 yesterday and today is 6.0 -Continue to monitor for signs and symptoms of infection next-repeat CBC in a.m  Generalized Weakness -In the setting of dehydration and acute blood loss anemia -PT/OT to Evaluate and Treat  DVT prophylaxis: Anticoagulated with Apixaban Code Status: FULL CODE  Family Communication: Discussed with the wife at bedside Disposition Plan: Pending further clinical improvement and back to baseline and evaluation by PT OT  Status is: Inpatient  Remains inpatient appropriate because:Unsafe d/c plan, IV treatments appropriate due to intensity of illness or inability to take PO and Inpatient level of care appropriate due to severity of illness   Dispo: The patient is from: Home              Anticipated d/c is to: TBD              Anticipated d/c date is:1-2 days              Patient currently is not medically stable to d/c.  Consultants:  Gastroenterology Notified Oncology via epic   Procedures: None  Antimicrobials:  Anti-infectives (From admission, onward)   None       Subjective: Seen and examined at bedside and he states that he had a "terrible night".  No nausea or vomiting.  Denies any chest pain, lightheadedness or dizziness but had bloody bowel movements all night last night.  Still feels  extremely weak.  Wife at bedside provided additional history.  No other concerns or complaints at this time and will consult GI for further evaluation.  Objective: Vitals:   10/03/20 1347 10/03/20 2011 10/03/20 2044 10/04/20 0434  BP: 138/66 (!) 117/48 123/60 (!) 169/67  Pulse: 77 76 78 (!) 51  Resp: 18  18 16   Temp: 98.4 F (36.9 C)  (!) 97.5 F (36.4 C) (!) 97.4 F (36.3 C)  TempSrc: Oral  Oral Oral  SpO2: 94%  96% 100%  Weight:      Height:        Intake/Output Summary (Last 24 hours) at 10/04/2020 1244 Last data filed at 10/04/2020 1035 Gross per 24  hour  Intake 1968.18 ml  Output 1300 ml  Net 668.18 ml   Filed Weights   10/02/20 1640  Weight: 108.9 kg   Examination: Physical Exam:  Constitutional: WN/WD obese Caucasian male currently no acute distress appears calm but slightly uncomfortable Eyes: Lids and conjunctivae normal, sclerae anicteric  ENMT: External Ears, Nose appear normal.  Slightly hard of hearing Neck: Appears normal, supple, no cervical masses, normal ROM, no appreciable thyromegaly; no JVD Respiratory: Diminished to auscultation bilaterally, no wheezing, rales, rhonchi or crackles. Normal respiratory effort and patient is not tachypenic. No accessory muscle use.  Unlabored breathing Cardiovascular: RRR, no murmurs / rubs / gallops. S1 and S2 auscultated.  Minimal extremity edema Abdomen: Soft, non-tender, distended secondary to body habitus.  Bowel sounds positive.  GU: Deferred. Musculoskeletal: No clubbing / cyanosis of digits/nails. No joint deformity upper and lower extremities.  Skin: Has bruising and ecchymosis in the upper extremities where his IV sites were.  No induration; Warm and dry.  Neurologic: CN 2-12 grossly intact with no focal deficits. Romberg sign and cerebellar reflexes not assessed.  Psychiatric: Normal judgment and insight. Alert and oriented x 3. Normal mood and appropriate affect.   Data Reviewed: I have personally reviewed  following labs and imaging studies  CBC: Recent Labs  Lab 09/30/20 0818 10/02/20 1747 10/03/20 0809 10/04/20 0604  WBC 8.2 14.2* 10.1 6.0  NEUTROABS 4.2 11.0* 8.0* 5.0  HGB 11.7* 8.4* 7.5* 6.6*  HCT 36.4* 26.0* 23.0* 20.2*  MCV 90.5 95.2 93.5 92.2  PLT 221 292 233 563   Basic Metabolic Panel: Recent Labs  Lab 09/30/20 0818 10/02/20 1747 10/03/20 0809 10/04/20 0604  NA 142 137 140 140  K 3.8 4.9 4.2 4.1  CL 104 100 105 108  CO2 31 26 23 25   GLUCOSE 103* 247* 163* 174*  BUN 15 82* 78* 59*  CREATININE 1.38* 1.70* 1.61* 1.42*  CALCIUM 9.2 8.5* 8.1* 8.1*  MG  --   --  2.1 2.1  PHOS  --   --  4.4 3.9   GFR: Estimated Creatinine Clearance: 49.6 mL/min (A) (by C-G formula based on SCr of 1.42 mg/dL (H)). Liver Function Tests: Recent Labs  Lab 09/30/20 0818 10/02/20 1747 10/03/20 0809 10/04/20 0604  AST 17 29 30 25   ALT 13 20 19 21   ALKPHOS 95 60 54 53  BILITOT 0.3 0.4 0.4 0.5  PROT 6.1* 5.2* 5.2* 5.0*  ALBUMIN 2.6* 2.6* 2.6* 2.5*   Recent Labs  Lab 10/02/20 1747  LIPASE 28   No results for input(s): AMMONIA in the last 168 hours. Coagulation Profile: No results for input(s): INR, PROTIME in the last 168 hours. Cardiac Enzymes: No results for input(s): CKTOTAL, CKMB, CKMBINDEX, TROPONINI in the last 168 hours. BNP (last 3 results) No results for input(s): PROBNP in the last 8760 hours. HbA1C: No results for input(s): HGBA1C in the last 72 hours. CBG: Recent Labs  Lab 10/03/20 1309 10/03/20 1815 10/03/20 2040 10/04/20 0745 10/04/20 1157  GLUCAP 129* 214* 262* 150* 192*   Lipid Profile: No results for input(s): CHOL, HDL, LDLCALC, TRIG, CHOLHDL, LDLDIRECT in the last 72 hours. Thyroid Function Tests: No results for input(s): TSH, T4TOTAL, FREET4, T3FREE, THYROIDAB in the last 72 hours. Anemia Panel: Recent Labs    10/04/20 0604  VITAMINB12 3,201*  FOLATE 22.3  FERRITIN 410*  TIBC 209*  IRON 192*  RETICCTPCT 1.4   Sepsis Labs: No results  for input(s): PROCALCITON, LATICACIDVEN in the last 168 hours.  Recent Results (from the past 240 hour(s))  Respiratory Panel by RT PCR (Flu A&B, Covid) - Nasopharyngeal Swab     Status: None   Collection Time: 10/02/20  5:57 PM   Specimen: Nasopharyngeal Swab  Result Value Ref Range Status   SARS Coronavirus 2 by RT PCR NEGATIVE NEGATIVE Final    Comment: (NOTE) SARS-CoV-2 target nucleic acids are NOT DETECTED.  The SARS-CoV-2 RNA is generally detectable in upper respiratoy specimens during the acute phase of infection. The lowest concentration of SARS-CoV-2 viral copies this assay can detect is 131 copies/mL. A negative result does not preclude SARS-Cov-2 infection and should not be used as the sole basis for treatment or other patient management decisions. A negative result may occur with  improper specimen collection/handling, submission of specimen other than nasopharyngeal swab, presence of viral mutation(s) within the areas targeted by this assay, and inadequate number of viral copies (<131 copies/mL). A negative result must be combined with clinical observations, patient history, and epidemiological information. The expected result is Negative.  Fact Sheet for Patients:  PinkCheek.be  Fact Sheet for Healthcare Providers:  GravelBags.it  This test is no t yet approved or cleared by the [place] FDA and  has been authorized for detection and/or diagnosis of SARS-CoV-2 by FDA under an Emergency Use Authorization (EUA). This EUA will remain  in effect (meaning this test can be used) for the duration of the COVID-19 declaration under Section 564(b)(1) of the Act, 21 U.S.C. section 360bbb-3(b)(1), unless the authorization is terminated or revoked sooner.     Influenza A by PCR NEGATIVE NEGATIVE Final   Influenza B by PCR NEGATIVE NEGATIVE Final    Comment: (NOTE) The Xpert Xpress SARS-CoV-2/FLU/RSV assay is  intended as an aid in  the diagnosis of influenza from Nasopharyngeal swab specimens and  should not be used as a sole basis for treatment. Nasal washings and  aspirates are unacceptable for Xpert Xpress SARS-CoV-2/FLU/RSV  testing.  Fact Sheet for Patients: PinkCheek.be  Fact Sheet for Healthcare Providers: GravelBags.it  This test is not yet approved or cleared by the [place] FDA and  has been authorized for detection and/or diagnosis of SARS-CoV-2 by  FDA under an Emergency Use Authorization (EUA). This EUA will remain  in effect (meaning this test can be used) for the duration of the  Covid-19 declaration under Section 564(b)(1) of the Act, 21  U.S.C. section 360bbb-3(b)(1), unless the authorization is  terminated or revoked. Performed at [facility], [place] [address] [place], [place] [address]     RN Pressure Injury Documentation:     Estimated body mass index is 37.59 kg/m as calculated from the following:   Height as of this encounter: 5\' 7"  (1.702 m).   Weight as of this encounter: 108.9 kg.  Malnutrition Type:  Nutrition Problem: Increased nutrient needs Etiology: cancer and cancer related treatments   Malnutrition Characteristics:  Signs/Symptoms: estimated needs   Nutrition Interventions:  Interventions: Glucerna shake Radiology Studies: DG Chest Portable 1 View  Result Date: 10/02/2020 CLINICAL DATA:  History of known lung cancer with weakness, initial encounter EXAM: PORTABLE CHEST 1 VIEW COMPARISON:  PET-CT from 08/22/2020 FINDINGS: Cardiac shadow is stable. Postsurgical changes are again seen. Bilateral soft tissue lung masses are noted consistent with the given clinical history. Elevation the right hemidiaphragm is again noted. No sizable effusion or pneumothorax is seen. No acute bony abnormality is noted. IMPRESSION: Bilateral masses consistent with the given clinical  history of lung carcinoma. No  acute abnormality noted. Electronically Signed   By: Inez Catalina M.D.   On: 10/02/2020 17:51   Scheduled Meds: . sodium chloride   Intravenous Once  . donepezil  10 mg Oral QHS  . feeding supplement (GLUCERNA SHAKE)  237 mL Oral TID BM  . folic acid  1 mg Oral Daily  . insulin aspart  0-15 Units Subcutaneous TID WC  . insulin aspart  0-5 Units Subcutaneous QHS  . insulin glargine  46 Units Subcutaneous QHS  . levothyroxine  75 mcg Oral QAC breakfast  . metoprolol succinate  25 mg Oral Daily  . ondansetron (ZOFRAN) IV  4 mg Intravenous Once  . [START ON 10/07/2020] pantoprazole  40 mg Intravenous Q12H  . rosuvastatin  20 mg Oral Daily  . sodium chloride flush  10-40 mL Intracatheter Q12H  . vitamin B-12  1,000 mcg Oral Daily   Continuous Infusions: . pantoprozole (PROTONIX) infusion    . pantoprazole (PROTONIX) IVPB      LOS: 2 days   Kerney Elbe, DO [facility] PAGER is on AMION  If 7PM-7AM, please contact night-coverage [email or web address]

## 2020-10-04 NOTE — Progress Notes (Signed)
PT Cancellation Note  Patient Details Name: Randall Wilkins MRN: [id number] DOB: June 01, 1941   Cancelled Treatment:    Reason Eval/Treat Not Completed: Medical issues which prohibited therapy (Patient with drop in hemoglobin (6.6) GI consulted for suspected upper GI bleeding; pt pending transfusion. Will follow up at later date/time as schedule allows and pt medically able to participate.)   Gwynneth Albright PT, [place]  Office [phone] Pager [phone]  10/04/2020 11:12 AM

## 2020-10-04 NOTE — Progress Notes (Signed)
OT Cancellation Note  Patient Details Name: Randall Wilkins MRN: [id number] DOB: [birth year]   Cancelled Treatment:    Reason Eval/Treat Not Completed: Patient not medically ready. Patient with low hemoglobin, pending blood transfusion. Will check back as schedule permits.  [name] OT OT pager: [place] 10/04/2020, 10:52 AM

## 2020-10-04 NOTE — H&P (View-Only) (Signed)
Referring Provider: Dr. Raiford Noble  Primary Care Physician:  [name], MD Primary Gastroenterologist:  Dr. Watt Climes  Reason for Consultation:  Suspected upper GI bleeding  HPI: Randall Wilkins is a 79 y.o. male with past medical history pertinent for CHF (LVEF by 3D volume is 48%), stage IV non-small cell lung cancer (on chemo), CAD s/p PCI, A fib (on Eliquis and ASA), CVA, and CKD stage 3 presenting for consultation of suspected upper GI bleeding.    Patient was admitted yesterday for nausea, weakness, and syncope.  He denies seeing any black stools at home, though started to have loose melenic stools yesterday after admission.  Denies any hematochezia.  Denies abdominal pain, vomiting, hematemesis.  Further denies recent changes in appetite, unexplained weight loss, or early satiety.  He has had a 5g drop in Hgb over the last 4 days.  Hgb currently 6.6.    Patient has a history of GI bleeding in 2016 from distal esophageal ulcers and duodenal ulcerations.  Patient is currently on 81 mg ASA and Eliquis, last dose of Eliquis yesterday. Denies any other NSAID use.  -EGD 03/2015: tiny hiatal hernia with distal esophageal ulcers and narrowing with minimal trauma with passing the scope, normal stomach status post antral and fundic biopsies to rule out H. Pylori (mild chronic inactive gastritis, H pylori negative). Significant bulb ulcerations multiple without signs of bleeding. C-loop narrowing with second portion of the duodenum shallow ulcerations as well.  -EGD 07/2015: chronic gastritis, normal duodenum and duodenal bulb   Past Medical History:  Diagnosis Date  . Aortic stenosis, mild 11/14/2013  . Aortic valve sclerosis 03/29/2015  . Bilateral leg edema 05/21/2014  . CAD (coronary artery disease)   . Chronic diastolic congestive heart failure ([place])   . Chronic kidney disease (CKD), stage III (moderate) (HCC)   . Diabetes 1.5, managed as type 1 ([place]) 02/04/2013  . Diabetic peripheral  neuropathy associated with type 1 diabetes mellitus ([place]) 11/14/2013  . Dysphagia   . Dyspnea on exertion 03/21/2015  . Exogenous obesity   . Gout   . Heart attack ([place])   . History of nuclear stress test 07/2011   dipyridamole; fixed inferolateral defect, worse at stress than rest; no reversible ischemia; low risk scan   . Hyperlipidemia   . Hypertension   . Hypothyroidism   . Insulin dependent diabetes mellitus   . Left foot drop   . Left main coronary artery disease 03/28/2015  . Memory loss   . Obesity (BMI 30-39.9) 11/14/2013  . Obstructive sleep apnea 03/21/2015  . OSA on CPAP    uses a cpap  . Peptic ulcer with hemorrhage 03/28/2015  . Peripheral neuropathy   . Pneumonia   . Rhabdomyolysis   . 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 Stroke ([place])    L patietal with small scattered lacunar infarcts  . Thrombocytopenia ([place]) 03/21/2015  . Venous insufficiency   . Weakness generalized 03/21/2015    Past Surgical History:  Procedure Laterality Date  . BACK SURGERY  2002   lumbosacral. 11 back surgeries total  . BRONCHIAL BIOPSY  08/06/2020   Procedure: BRONCHIAL BIOPSIES;  Surgeon: Collene Gobble, MD;  Location: [facility] END[facility]COPY;  Service: Pulmonary;;  . BRONCHIAL BRUSHINGS  08/06/2020   Procedure: BRONCHIAL BRUSHINGS;  Surgeon: Collene Gobble, MD;  Location: [facility] ENDOSCOPY;  Service: Pulmonary;;  . BRONCHIAL NEEDLE ASPIRATION BIOPSY  08/06/2020   Procedure: BRONCHIAL  NEEDLE ASPIRATION BIOPSIES;  Surgeon: Collene Gobble, MD;  Location: [facility] ENDOSCOPY;  Service: Pulmonary;;  . Carotid Doppler  03/2013   bilat bulb/prox ICAs - mild amount of fibrous plaque with no evidence of diameter reduction  . CARPAL TUNNEL RELEASE Bilateral 08/09/2014   Procedure: BILATERAL CARPAL TUNNEL RELEASE;  Surgeon: Daryll Brod, MD;  Location: [place];  Service: Orthopedics;  Laterality: Bilateral;  ANESTHESIA:  IV REGIONAL BIL FAB  . CHOLECYSTECTOMY    .  COLONOSCOPY    . CORONARY ANGIOPLASTY  10/13/1996  . CORONARY ANGIOPLASTY  09/21/1989   emergency PTCA  . CORONARY ANGIOPLASTY  10/13/1996   Multi-Link diagonal & OD stenting (Dr. Marella Chimes)  . CORONARY ANGIOPLASTY  12/03/1997   disease of mid DX-1 ~50% & in mid PLA & PDA (distal lesions) (Dr. Marella Chimes)   . CORONARY ANGIOPLASTY  10/14/1999   progression of disease distal PLA & PDA; progression of disease prox RCA - moderate (Dr. Marella Chimes)   . CORONARY ANGIOPLASTY WITH STENT PLACEMENT  04/04/2004   4.0x61m non-DES (thrombectomy via AngioJet) to RCA for high grade stenosis (Dr. RMarella Chimes  . CORONARY ARTERY BYPASS GRAFT N/A 03/29/2015   Procedure: CORONARY ARTERY BYPASS GRAFTING TIMES TWO USING LEFT INTERNAL MAMMARY ARTERY AND RIGHT LEG GREATER SAPHENOUS VEIN HARVESTED ENDOSCOPICALLY.;  Surgeon: CRexene Alberts MD;  [place]  Service: Open Heart Surgery;  Laterality: N/A;  . ESOPHAGOGASTRODUODENOSCOPY N/A 03/27/2015   Procedure: ESOPHAGOGASTRODUODENOSCOPY (EGD);  Surgeon: MClarene Essex MD;  [facility];  Service: Endoscopy;  Laterality: N/A;  possible dilation  . FINE NEEDLE ASPIRATION  08/06/2020   Procedure: FINE NEEDLE ASPIRATION (FNA) LINEAR;  Surgeon: BCollene Gobble MD;  [facility];  Service: Pulmonary;;  . LEFT HEART CATHETERIZATION WITH CORONARY ANGIOGRAM N/A 03/28/2015   Procedure: LEFT HEART CATHETERIZATION WITH CORONARY ANGIOGRAM;  Surgeon: Peter M JMartinique MD;  [facility];  Service: Cardiovascular;  Laterality: N/A;  . SINUS ENDO W/FUSION    . TONSILLECTOMY    . TRANSTHORACIC ECHOCARDIOGRAM  08/08/2013   EF 55-60%, mild conc hypertrophy, grade 1 diastolic dysfunction; AV with mild stenosis; LA & RA mildly dilated  . VIDEO BRONCHOSCOPY WITH ENDOBRONCHIAL NAVIGATION N/A 08/06/2020   Procedure: VIDEO BRONCHOSCOPY WITH ENDOBRONCHIAL NAVIGATION;  Surgeon: BCollene Gobble MD;  [facility];  Service: Pulmonary;  Laterality: N/A;   . VIDEO BRONCHOSCOPY WITH ENDOBRONCHIAL ULTRASOUND N/A 08/06/2020   Procedure: VIDEO BRONCHOSCOPY WITH ENDOBRONCHIAL ULTRASOUND;  Surgeon: BCollene Gobble MD;  [facility];  Service: Pulmonary;  Laterality: N/A;    Prior to Admission medications   Medication Sig Start Date End Date Taking? Authorizing Provider  albuterol (VENTOLIN HFA) 108 (90 Base) MCG/ACT inhaler Inhale 2 puffs into the lungs every 6 (six) hours as needed for wheezing or shortness of breath. 09/04/20  Yes BCollene Gobble MD  allopurinol (ZYLOPRIM) 300 MG tablet Take 300 mg by mouth daily.  02/20/16  Yes [provider]  amitriptyline (ELAVIL) 50 MG tablet Take 75 mg by mouth at bedtime.    Yes [provider]  apixaban (ELIQUIS) 5 MG TABS tablet Take 1 tablet (5 mg total) by mouth 2 (two) times daily. You may restart this medication on Thursday, 08/08/2020. Patient taking differently: Take 5 mg by mouth 2 (two) times daily.  08/06/20  Yes BCollene Gobble MD  aspirin 81 MG tablet Take 1 tablet (81 mg total) by mouth daily. 04/29/15  Yes ORexene Alberts MD  donepezil (ARICEPT) 10 MG tablet TAKE ONE TABLET DAILY.  PLEASE CALL [phone] TO SCHEDULE FOLLOW UP. Patient taking differently: Take 10 mg by mouth daily.  07/10/19  Yes Marcial Pacas, MD  folic acid (FOLVITE) 1 MG tablet Take 1 tablet (1 mg total) by mouth daily. 09/02/20  Yes Curt Bears, MD  furosemide (LASIX) 40 MG tablet Take 80 mg by mouth in the morning.  03/11/20  Yes [provider]  insulin degludec (TRESIBA FLEXTOUCH) 200 UNIT/ML FlexTouch Pen Inject 46 Units into the skin at bedtime.   Yes [provider]  insulin lispro (HUMALOG) 100 UNIT/ML cartridge Inject 5-15 Units into the skin 3 (three) times daily with meals. Sliding scale.   Yes [provider]  levothyroxine (SYNTHROID, LEVOTHROID) 75 MCG tablet Take 75 mcg by mouth daily before breakfast.  05/14/17  Yes [provider]  lidocaine  (XYLOCAINE) 2 % solution Use as directed 15 mLs in the mouth or throat as needed for mouth pain. 09/19/20  Yes Carmon Sails J, PA-C  lidocaine-prilocaine (EMLA) cream Apply 1 application topically as needed. Apply a teaspoon over port site at least 1 hour prior to lab appt. Do not rub in and cover with plastic wrap. 09/30/20  Yes Curt Bears, MD  lisinopril (ZESTRIL) 10 MG tablet TAKE ONE TABLET DAILY Patient taking differently: Take 10 mg by mouth daily.  08/12/20  Yes Elayne Snare, MD  metFORMIN (GLUCOPHAGE-XR) 750 MG 24 hr tablet TAKE 1 TABLET WITH DINNER Patient taking differently: Take 750 mg by mouth daily.  08/12/20  Yes Elayne Snare, MD  metoprolol succinate (TOPROL-XL) 25 MG 24 hr tablet Take 1 tablet (25 mg total) by mouth daily. 03/13/20  Yes Hilty, Nadean Corwin, MD  NON FORMULARY Inhale 1 application into the lungs at bedtime. CPAP   Yes [provider]  potassium chloride SA (K-DUR,KLOR-CON) 20 MEQ tablet Take 20 mEq by mouth 2 (two) times daily.    Yes [provider]  prochlorperazine (COMPAZINE) 10 MG tablet Take 1 tablet (10 mg total) by mouth every 6 (six) hours as needed for nausea or vomiting. 09/02/20  Yes Curt Bears, MD  rosuvastatin (CRESTOR) 20 MG tablet Take 20 mg by mouth daily.   Yes [provider]  Tamsulosin HCl (FLOMAX) 0.4 MG CAPS Take 0.4 mg by mouth daily.    Yes [provider]  torsemide (DEMADEX) 10 MG tablet Take 10 mg by mouth every evening.    Yes [provider]  vitamin B-12 (CYANOCOBALAMIN) 1000 MCG tablet Take 1,000 mcg by mouth daily.   Yes [provider]  Blood Glucose Monitoring Suppl (CONTOUR NEXT MONITOR) w/Device KIT 1 Device by Does not apply route 3 (three) times daily. Use to check blood sugars 3 times daily. Dx Code E13.9 07/29/17   Elayne Snare, MD  CONTOUR NEXT TEST test strip USE 1 STRIP TO CHECK GLUCOSE 4 TIMES DAILY 08/09/20   Elayne Snare, MD  Insulin Pen Needle (EASY COMFORT PEN  NEEDLES) 33G X 4 MM MISC 1 each by Does not apply route See admin instructions. Use to inject insulin 5 times daily. 05/31/19   Elayne Snare, MD  liraglutide (VICTOZA) 18 MG/3ML SOPN Inject 1.2 mg into the skin daily.     [provider]    Current Facility-Administered Medications  Medication Dose Route Frequency Provider Last Rate Last Admin  . 0.9 %  sodium chloride infusion (Manually program via Guardrails IV Fluids)   Intravenous Once Avery, [place], DO      .  acetaminophen (TYLENOL) tablet 650 mg  650 mg Oral Q6H PRN Elwyn Reach, MD       Or  . acetaminophen (TYLENOL) suppository 650 mg  650 mg Rectal Q6H PRN Jonelle Sidle, Mohammad L, MD      . albuterol (VENTOLIN HFA) 108 (90 Base) MCG/ACT inhaler 2 puff  2 puff Inhalation Q6H PRN Sheikh, Omair Latif, DO      . calcium carbonate (dosed in mg elemental calcium) suspension 500 mg of elemental calcium  500 mg of elemental calcium Oral Q6H PRN Elwyn Reach, MD      . camphor-menthol (SARNA) lotion 1 application  1 application Topical O1H PRN Elwyn Reach, MD       And  . hydrOXYzine (ATARAX/VISTARIL) tablet 25 mg  25 mg Oral Q8H PRN Elwyn Reach, MD      . docusate sodium (ENEMEEZ) enema 283 mg  1 enema Rectal PRN Elwyn Reach, MD      . donepezil (ARICEPT) tablet 10 mg  10 mg Oral QHS Sheikh, Georgina Quint [place], DO   10 mg at 10/03/20 2132  . feeding supplement (GLUCERNA SHAKE) (GLUCERNA SHAKE) liquid 237 mL  237 mL Oral TID BM SheikhGeorgina Quint [name], DO   237 mL at 10/03/20 2014  . folic acid (FOLVITE) tablet 1 mg  1 mg Oral Daily Raiford Noble [place], DO   1 mg at 10/04/20 0865  . insulin aspart (novoLOG) injection 0-15 Units  0-15 Units Subcutaneous TID WC Raiford Noble [place], DO   2 Units at 10/04/20 [phone]  . insulin aspart (novoLOG) injection 0-5 Units  0-5 Units Subcutaneous QHS Raiford Noble [place], [place]   3 Units at 10/03/20 2132  . insulin glargine (LANTUS) injection 46 Units  46 Units Subcutaneous QHS Raiford Noble  [place], [place]   46 Units at 10/03/20 2133  . levothyroxine (SYNTHROID) tablet 75 mcg  75 mcg Oral QAC breakfast Raiford Noble [place], [place]   75 mcg at 10/04/20 9629  . metoprolol succinate (TOPROL-XL) 24 hr tablet 25 mg  25 mg Oral Daily Raiford Noble [place], DO   25 mg at 10/04/20 0844  . ondansetron (ZOFRAN) injection 4 mg  4 mg Intravenous Once Lucrezia Starch, MD      . ondansetron [facility]) tablet 4 mg  4 mg Oral Q6H PRN Elwyn Reach, MD       Or  . ondansetron (ZOFRAN) injection 4 mg  4 mg Intravenous Q6H PRN Elwyn Reach, MD      . pantoprazole (PROTONIX) 80 mg in sodium chloride 0.9 % 100 mL (0.8 mg/mL) infusion  8 mg/hr Intravenous Continuous Sheikh, Omair Latif, DO      . pantoprazole (PROTONIX) 80 mg in sodium chloride 0.9 % 100 mL IVPB  80 mg Intravenous Once Sheikh, Omair Latif, DO      . [START ON 10/07/2020] pantoprazole (PROTONIX) injection 40 mg  40 mg Intravenous Q12H Sheikh, Omair Latif, DO      . rosuvastatin (CRESTOR) tablet 20 mg  20 mg Oral Daily Raiford Noble [place], DO   20 mg at 10/04/20 0844  . sodium chloride flush (NS) 0.9 % injection 10-40 mL  10-40 mL Intracatheter Q12H Sheikh, Omair Latif, DO      . sodium chloride flush (NS) 0.9 % injection 10-40 mL  10-40 mL Intracatheter PRN Sheikh, Omair Latif, DO      . sorbitol 70 % solution 30 mL  30 mL Oral PRN Elwyn Reach, MD      .  vitamin B-12 (CYANOCOBALAMIN) tablet 1,000 mcg  1,000 mcg Oral Daily Raiford Noble [place], DO   1,000 mcg at 10/04/20 7672  . zolpidem (AMBIEN) tablet 5 mg  5 mg Oral QHS PRN Elwyn Reach, MD        Allergies as of 10/02/2020 - Review Complete 10/02/2020  Allergen Reaction Noted  . Actos [pioglitazone] Swelling 03/21/2015  . Metformin and related Nausea Only 03/21/2015  . Niaspan [niacin er] Itching and Rash 08/09/2012    Family History  Problem Relation Age of Onset  . Heart disease Mother   . Coronary artery disease Father   . Cancer Maternal Grandmother   . Heart  Problems Maternal Grandfather   . Diabetes Son        borderline     Social History   Socioeconomic History  . Marital status: Married    Spouse name: Not on file  . Number of children: 3  . Years of education: 45  . Highest education level: Not on file  Occupational History  . Not on file  Tobacco Use  . Smoking status: Former Smoker    Packs/day: 2.00    Years: 20.00    Pack years: 40.00    Types: Cigarettes    Quit date: 01/25/1974    Years since quitting: 46.7  . Smokeless tobacco: Never Used  Vaping Use  . Vaping Use: Never used  Substance and Sexual Activity  . Alcohol use: No    Alcohol/week: 0.0 standard drinks  . Drug use: No  . Sexual activity: Not on file  Other Topics Concern  . Not on file  Social History Narrative   Lives with wife.    Social Determinants of Health   Financial Resource Strain:   . Difficulty of Paying Living Expenses: Not on file  Food Insecurity:   . Worried About Charity fundraiser in the Last Year: Not on file  . Ran Out of Food in the Last Year: Not on file  Transportation Needs:   . Lack of Transportation (Medical): Not on file  . Lack of Transportation (Non-Medical): Not on file  Physical Activity:   . Days of Exercise per Week: Not on file  . Minutes of Exercise per Session: Not on file  Stress:   . Feeling of Stress : Not on file  Social Connections:   . Frequency of Communication with Friends and Family: Not on file  . Frequency of Social Gatherings with Friends and Family: Not on file  . Attends Religious Services: Not on file  . Active Member of Clubs or Organizations: Not on file  . Attends Archivist Meetings: Not on file  . Marital Status: Not on file  Intimate Partner Violence:   . Fear of Current or Ex-Partner: Not on file  . Emotionally Abused: Not on file  . Physically Abused: Not on file  . Sexually Abused: Not on file    Review of Systems: Review of Systems  Constitutional: Positive for  malaise/fatigue. Negative for chills and fever.  HENT: Negative for sore throat.   Eyes: Negative for pain and redness.  Respiratory: Negative for cough, shortness of breath and stridor.   Cardiovascular: Negative for chest pain and palpitations.  Gastrointestinal: Positive for diarrhea, melena and nausea. Negative for abdominal pain, blood in stool, constipation, heartburn and vomiting.  Genitourinary: Negative for flank pain and hematuria.  Musculoskeletal: Negative for back pain and joint pain.  Skin: Negative for itching and rash.  Neurological: Positive  for loss of consciousness and weakness.  Endo/Heme/Allergies: Negative for polydipsia. Bruises/bleeds easily.  Psychiatric/Behavioral: Negative for substance abuse. The patient is not nervous/anxious.     Physical Exam: Vital signs in last 24 hours: Temp:  [97.4 F (36.3 C)-98.4 F (36.9 C)] 97.4 F (36.3 C) (10/22 0434) Pulse Rate:  [51-78] 51 (10/22 0434) Resp:  [16-18] 16 (10/22 0434) BP: (117-169)/(48-67) 169/67 (10/22 0434) SpO2:  [94 %-100 %] 100 % (10/22 0434) Last BM Date: 10/03/20 General: Lethatgic,  Well-developed, well-nourished, pleasant and cooperative in NAD Head: Normocephalic and atraumatic; wife at bedside. Eyes: Conjunctival pallor. Sclera clear, no icterus.   Mouth:  No ulcerations or lesions.  Oropharynx pink & moist. Neck:  No masses or thyromegaly. Lungs: Clear throughout to auscultation.   No wheezes, crackles, or rhonchi. No evident respiratory distress. Heart:  Regular rate and rhythm; +murmur Abdomen: Soft, nontender, and nondistended. No masses, hepatosplenomegaly or ventral hernias noted. Normal bowel sounds, without bruits, guarding, or rebound.   Msk:  Symmetrical without gross deformities. Pulses: Normal pulses noted. Extremities:  Without clubbing or edema. Neurologic: Lethargic but oriented and coherent;  grossly normal neurologically. Skin: Ecchymosis present on bilateral upper and lower  extremities. Cervical Nodes:  No significant cervical adenopathy. Psych:  Normal mood and affect.  Intake/Output from previous day: 10/21 0701 - 10/22 0700 In: 1608.2 [P.O.:720; I.V.:888.2] Out: 1500 [Urine:1500] Intake/Output this shift: Total I/O In: -  Out: 500 [Urine:500]  Lab Results: Recent Labs    10/02/20 1747 10/03/20 0809 10/04/20 0604  WBC 14.2* 10.1 6.0  HGB 8.4* 7.5* 6.6*  HCT 26.0* 23.0* 20.2*  PLT 292 233 197   BMET Recent Labs    10/02/20 1747 10/03/20 0809 10/04/20 0604  NA 137 140 140  K 4.9 4.2 4.1  CL 100 105 108  CO2 _0 GLUCOSE 247* 163* 174*  BUN 82* 78* 59*  CREATININE 1.70* 1.61* 1.42*  CALCIUM 8.5* 8.1* 8.1*   LFT Recent Labs    10/04/20 0604  PROT 5.0*  ALBUMIN 2.5*  AST 25  ALT 21  ALKPHOS 53  BILITOT 0.5   PT/INR No results for input(s): LABPROT, INR in the last 72 hours.  Studies/Results: DG Chest Portable 1 View  Result Date: 10/02/2020 CLINICAL DATA:  History of known lung cancer with weakness, initial encounter EXAM: PORTABLE CHEST 1 VIEW COMPARISON:  PET-CT from 08/22/2020 FINDINGS: Cardiac shadow is stable. Postsurgical changes are again seen. Bilateral soft tissue lung masses are noted consistent with the given clinical history. Elevation the right hemidiaphragm is again noted. No sizable effusion or pneumothorax is seen. No acute bony abnormality is noted. IMPRESSION: Bilateral masses consistent with the given clinical history of lung carcinoma. No acute abnormality noted. Electronically Signed   By: Inez Catalina M.D.   On: 10/02/2020 17:51    Impression: Suspected upper GI bleeding: Melena and 5 g drop in hemoglobin over the past 4 days.   -Hemoglobin 6.6 today.   -Elevated BUN out of proportion to creatinine over the last 2 days, suggestive of upper GI bleeding, though it should be noted that patient has CKD stage III.  BUN 59/Cr 1.42 today as compared to BUN 78/Cr 1.61 yesterday.  BUN was normal at 15 with  creatinine 1.38 four days ago.  A. fib, history of CVA: On Eliquis (last dose 10/21 PM) and 81 mg aspirin  CHF (LVEF by 3D volume is 48%)  Stage IV non-small cell lung cancer (on chemo)  Plan: Initiate Protonix  IV drip.  Due to lack of anesthesia availability in the endoscopy unit today, coupled with the fact that the patient had a regular diet for breakfast, we will proceed with EGD tomorrow.  I thoroughly discussed the procedure with the patient and the patient's wife to include nature, alternatives, benefits, and risks (including but not limited to bleeding, infection, perforation, anesthesia/cardiac and pulmonary complications).  Patient verbalized understanding and gave verbal consent to proceed with EGD.   Continue to hold Eliquis.  Clear liquid diet with NPO after midnight.  Continue to monitor H&H with transfusion as needed to maintain hemoglobin greater than 7-8.  [facility] will follow.   LOS: 2 days   Salley Slaughter  10/04/2020, 9:45 AM [phone]

## 2020-10-04 NOTE — Progress Notes (Signed)
CRITICAL VALUE ALERT  Critical Value: =Hemoglobin 6.6  Date & Time Notied: 10/04/2020 @0645   Provider Notified: MD Hal Hope  Orders Received/Actions taken: None

## 2020-10-04 NOTE — Consult Note (Addendum)
Referring Provider: Dr. Raiford Noble  Primary Care Physician:  [name], MD Primary Gastroenterologist:  Dr. Watt Climes  Reason for Consultation:  Suspected upper GI bleeding  HPI: Randall Wilkins is a 79 y.o. male with past medical history pertinent for CHF (LVEF by 3D volume is 48%), stage IV non-small cell lung cancer (on chemo), CAD s/p PCI, A fib (on Eliquis and ASA), CVA, and CKD stage 3 presenting for consultation of suspected upper GI bleeding.    Patient was admitted yesterday for nausea, weakness, and syncope.  He denies seeing any black stools at home, though started to have loose melenic stools yesterday after admission.  Denies any hematochezia.  Denies abdominal pain, vomiting, hematemesis.  Further denies recent changes in appetite, unexplained weight loss, or early satiety.  He has had a 5g drop in Hgb over the last 4 days.  Hgb currently 6.6.    Patient has a history of GI bleeding in 2016 from distal esophageal ulcers and duodenal ulcerations.  Patient is currently on 81 mg ASA and Eliquis, last dose of Eliquis yesterday. Denies any other NSAID use.  -EGD 03/2015: tiny hiatal hernia with distal esophageal ulcers and narrowing with minimal trauma with passing the scope, normal stomach status post antral and fundic biopsies to rule out H. Pylori (mild chronic inactive gastritis, H pylori negative). Significant bulb ulcerations multiple without signs of bleeding. C-loop narrowing with second portion of the duodenum shallow ulcerations as well.  -EGD 07/2015: chronic gastritis, normal duodenum and duodenal bulb   Past Medical History:  Diagnosis Date  . Aortic stenosis, mild 11/14/2013  . Aortic valve sclerosis 03/29/2015  . Bilateral leg edema 05/21/2014  . CAD (coronary artery disease)   . Chronic diastolic congestive heart failure ([place])   . Chronic kidney disease (CKD), stage III (moderate) (HCC)   . Diabetes 1.5, managed as type 1 ([place]) 02/04/2013  . Diabetic peripheral  neuropathy associated with type 1 diabetes mellitus ([place]) 11/14/2013  . Dysphagia   . Dyspnea on exertion 03/21/2015  . Exogenous obesity   . Gout   . Heart attack ([place])   . History of nuclear stress test 07/2011   dipyridamole; fixed inferolateral defect, worse at stress than rest; no reversible ischemia; low risk scan   . Hyperlipidemia   . Hypertension   . Hypothyroidism   . Insulin dependent diabetes mellitus   . Left foot drop   . Left main coronary artery disease 03/28/2015  . Memory loss   . Obesity (BMI 30-39.9) 11/14/2013  . Obstructive sleep apnea 03/21/2015  . OSA on CPAP    uses a cpap  . Peptic ulcer with hemorrhage 03/28/2015  . Peripheral neuropathy   . Pneumonia   . Rhabdomyolysis   . 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 Stroke ([place])    L patietal with small scattered lacunar infarcts  . Thrombocytopenia ([place]) 03/21/2015  . Venous insufficiency   . Weakness generalized 03/21/2015    Past Surgical History:  Procedure Laterality Date  . BACK SURGERY  2002   lumbosacral. 11 back surgeries total  . BRONCHIAL BIOPSY  08/06/2020   Procedure: BRONCHIAL BIOPSIES;  Surgeon: Collene Gobble, MD;  Location: [facility] END[facility]COPY;  Service: Pulmonary;;  . BRONCHIAL BRUSHINGS  08/06/2020   Procedure: BRONCHIAL BRUSHINGS;  Surgeon: Collene Gobble, MD;  Location: [facility] ENDOSCOPY;  Service: Pulmonary;;  . BRONCHIAL NEEDLE ASPIRATION BIOPSY  08/06/2020   Procedure: BRONCHIAL  NEEDLE ASPIRATION BIOPSIES;  Surgeon: Collene Gobble, MD;  Location: [facility] ENDOSCOPY;  Service: Pulmonary;;  . Carotid Doppler  03/2013   bilat bulb/prox ICAs - mild amount of fibrous plaque with no evidence of diameter reduction  . CARPAL TUNNEL RELEASE Bilateral 08/09/2014   Procedure: BILATERAL CARPAL TUNNEL RELEASE;  Surgeon: Daryll Brod, MD;  Location: [place];  Service: Orthopedics;  Laterality: Bilateral;  ANESTHESIA:  IV REGIONAL BIL FAB  . CHOLECYSTECTOMY    .  COLONOSCOPY    . CORONARY ANGIOPLASTY  10/13/1996  . CORONARY ANGIOPLASTY  09/21/1989   emergency PTCA  . CORONARY ANGIOPLASTY  10/13/1996   Multi-Link diagonal & OD stenting (Dr. Marella Chimes)  . CORONARY ANGIOPLASTY  12/03/1997   disease of mid DX-1 ~50% & in mid PLA & PDA (distal lesions) (Dr. Marella Chimes)   . CORONARY ANGIOPLASTY  10/14/1999   progression of disease distal PLA & PDA; progression of disease prox RCA - moderate (Dr. Marella Chimes)   . CORONARY ANGIOPLASTY WITH STENT PLACEMENT  04/04/2004   4.0x61m non-DES (thrombectomy via AngioJet) to RCA for high grade stenosis (Dr. RMarella Chimes  . CORONARY ARTERY BYPASS GRAFT N/A 03/29/2015   Procedure: CORONARY ARTERY BYPASS GRAFTING TIMES TWO USING LEFT INTERNAL MAMMARY ARTERY AND RIGHT LEG GREATER SAPHENOUS VEIN HARVESTED ENDOSCOPICALLY.;  Surgeon: CRexene Alberts MD;  [place]  Service: Open Heart Surgery;  Laterality: N/A;  . ESOPHAGOGASTRODUODENOSCOPY N/A 03/27/2015   Procedure: ESOPHAGOGASTRODUODENOSCOPY (EGD);  Surgeon: MClarene Essex MD;  [facility];  Service: Endoscopy;  Laterality: N/A;  possible dilation  . FINE NEEDLE ASPIRATION  08/06/2020   Procedure: FINE NEEDLE ASPIRATION (FNA) LINEAR;  Surgeon: BCollene Gobble MD;  [facility];  Service: Pulmonary;;  . LEFT HEART CATHETERIZATION WITH CORONARY ANGIOGRAM N/A 03/28/2015   Procedure: LEFT HEART CATHETERIZATION WITH CORONARY ANGIOGRAM;  Surgeon: Peter M JMartinique MD;  [facility];  Service: Cardiovascular;  Laterality: N/A;  . SINUS ENDO W/FUSION    . TONSILLECTOMY    . TRANSTHORACIC ECHOCARDIOGRAM  08/08/2013   EF 55-60%, mild conc hypertrophy, grade 1 diastolic dysfunction; AV with mild stenosis; LA & RA mildly dilated  . VIDEO BRONCHOSCOPY WITH ENDOBRONCHIAL NAVIGATION N/A 08/06/2020   Procedure: VIDEO BRONCHOSCOPY WITH ENDOBRONCHIAL NAVIGATION;  Surgeon: BCollene Gobble MD;  [facility];  Service: Pulmonary;  Laterality: N/A;   . VIDEO BRONCHOSCOPY WITH ENDOBRONCHIAL ULTRASOUND N/A 08/06/2020   Procedure: VIDEO BRONCHOSCOPY WITH ENDOBRONCHIAL ULTRASOUND;  Surgeon: BCollene Gobble MD;  [facility];  Service: Pulmonary;  Laterality: N/A;    Prior to Admission medications   Medication Sig Start Date End Date Taking? Authorizing Provider  albuterol (VENTOLIN HFA) 108 (90 Base) MCG/ACT inhaler Inhale 2 puffs into the lungs every 6 (six) hours as needed for wheezing or shortness of breath. 09/04/20  Yes BCollene Gobble MD  allopurinol (ZYLOPRIM) 300 MG tablet Take 300 mg by mouth daily.  02/20/16  Yes [provider]  amitriptyline (ELAVIL) 50 MG tablet Take 75 mg by mouth at bedtime.    Yes [provider]  apixaban (ELIQUIS) 5 MG TABS tablet Take 1 tablet (5 mg total) by mouth 2 (two) times daily. You may restart this medication on Thursday, 08/08/2020. Patient taking differently: Take 5 mg by mouth 2 (two) times daily.  08/06/20  Yes BCollene Gobble MD  aspirin 81 MG tablet Take 1 tablet (81 mg total) by mouth daily. 04/29/15  Yes ORexene Alberts MD  donepezil (ARICEPT) 10 MG tablet TAKE ONE TABLET DAILY.  PLEASE CALL [phone] TO SCHEDULE FOLLOW UP. Patient taking differently: Take 10 mg by mouth daily.  07/10/19  Yes Marcial Pacas, MD  folic acid (FOLVITE) 1 MG tablet Take 1 tablet (1 mg total) by mouth daily. 09/02/20  Yes Curt Bears, MD  furosemide (LASIX) 40 MG tablet Take 80 mg by mouth in the morning.  03/11/20  Yes [provider]  insulin degludec (TRESIBA FLEXTOUCH) 200 UNIT/ML FlexTouch Pen Inject 46 Units into the skin at bedtime.   Yes [provider]  insulin lispro (HUMALOG) 100 UNIT/ML cartridge Inject 5-15 Units into the skin 3 (three) times daily with meals. Sliding scale.   Yes [provider]  levothyroxine (SYNTHROID, LEVOTHROID) 75 MCG tablet Take 75 mcg by mouth daily before breakfast.  05/14/17  Yes [provider]  lidocaine  (XYLOCAINE) 2 % solution Use as directed 15 mLs in the mouth or throat as needed for mouth pain. 09/19/20  Yes Carmon Sails J, PA-C  lidocaine-prilocaine (EMLA) cream Apply 1 application topically as needed. Apply a teaspoon over port site at least 1 hour prior to lab appt. Do not rub in and cover with plastic wrap. 09/30/20  Yes Curt Bears, MD  lisinopril (ZESTRIL) 10 MG tablet TAKE ONE TABLET DAILY Patient taking differently: Take 10 mg by mouth daily.  08/12/20  Yes Elayne Snare, MD  metFORMIN (GLUCOPHAGE-XR) 750 MG 24 hr tablet TAKE 1 TABLET WITH DINNER Patient taking differently: Take 750 mg by mouth daily.  08/12/20  Yes Elayne Snare, MD  metoprolol succinate (TOPROL-XL) 25 MG 24 hr tablet Take 1 tablet (25 mg total) by mouth daily. 03/13/20  Yes Hilty, Nadean Corwin, MD  NON FORMULARY Inhale 1 application into the lungs at bedtime. CPAP   Yes [provider]  potassium chloride SA (K-DUR,KLOR-CON) 20 MEQ tablet Take 20 mEq by mouth 2 (two) times daily.    Yes [provider]  prochlorperazine (COMPAZINE) 10 MG tablet Take 1 tablet (10 mg total) by mouth every 6 (six) hours as needed for nausea or vomiting. 09/02/20  Yes Curt Bears, MD  rosuvastatin (CRESTOR) 20 MG tablet Take 20 mg by mouth daily.   Yes [provider]  Tamsulosin HCl (FLOMAX) 0.4 MG CAPS Take 0.4 mg by mouth daily.    Yes [provider]  torsemide (DEMADEX) 10 MG tablet Take 10 mg by mouth every evening.    Yes [provider]  vitamin B-12 (CYANOCOBALAMIN) 1000 MCG tablet Take 1,000 mcg by mouth daily.   Yes [provider]  Blood Glucose Monitoring Suppl (CONTOUR NEXT MONITOR) w/Device KIT 1 Device by Does not apply route 3 (three) times daily. Use to check blood sugars 3 times daily. Dx Code E13.9 07/29/17   Elayne Snare, MD  CONTOUR NEXT TEST test strip USE 1 STRIP TO CHECK GLUCOSE 4 TIMES DAILY 08/09/20   Elayne Snare, MD  Insulin Pen Needle (EASY COMFORT PEN  NEEDLES) 33G X 4 MM MISC 1 each by Does not apply route See admin instructions. Use to inject insulin 5 times daily. 05/31/19   Elayne Snare, MD  liraglutide (VICTOZA) 18 MG/3ML SOPN Inject 1.2 mg into the skin daily.     [provider]    Current Facility-Administered Medications  Medication Dose Route Frequency Provider Last Rate Last Admin  . 0.9 %  sodium chloride infusion (Manually program via Guardrails IV Fluids)   Intravenous Once Avery, [place], DO      .  acetaminophen (TYLENOL) tablet 650 mg  650 mg Oral Q6H PRN Elwyn Reach, MD       Or  . acetaminophen (TYLENOL) suppository 650 mg  650 mg Rectal Q6H PRN Jonelle Sidle, Mohammad L, MD      . albuterol (VENTOLIN HFA) 108 (90 Base) MCG/ACT inhaler 2 puff  2 puff Inhalation Q6H PRN Sheikh, Omair Latif, DO      . calcium carbonate (dosed in mg elemental calcium) suspension 500 mg of elemental calcium  500 mg of elemental calcium Oral Q6H PRN Elwyn Reach, MD      . camphor-menthol (SARNA) lotion 1 application  1 application Topical O1H PRN Elwyn Reach, MD       And  . hydrOXYzine (ATARAX/VISTARIL) tablet 25 mg  25 mg Oral Q8H PRN Elwyn Reach, MD      . docusate sodium (ENEMEEZ) enema 283 mg  1 enema Rectal PRN Elwyn Reach, MD      . donepezil (ARICEPT) tablet 10 mg  10 mg Oral QHS Sheikh, Georgina Quint [place], DO   10 mg at 10/03/20 2132  . feeding supplement (GLUCERNA SHAKE) (GLUCERNA SHAKE) liquid 237 mL  237 mL Oral TID BM SheikhGeorgina Quint [name], DO   237 mL at 10/03/20 2014  . folic acid (FOLVITE) tablet 1 mg  1 mg Oral Daily Raiford Noble [place], DO   1 mg at 10/04/20 0865  . insulin aspart (novoLOG) injection 0-15 Units  0-15 Units Subcutaneous TID WC Raiford Noble [place], DO   2 Units at 10/04/20 [phone]  . insulin aspart (novoLOG) injection 0-5 Units  0-5 Units Subcutaneous QHS Raiford Noble [place], [place]   3 Units at 10/03/20 2132  . insulin glargine (LANTUS) injection 46 Units  46 Units Subcutaneous QHS Raiford Noble  [place], [place]   46 Units at 10/03/20 2133  . levothyroxine (SYNTHROID) tablet 75 mcg  75 mcg Oral QAC breakfast Raiford Noble [place], [place]   75 mcg at 10/04/20 9629  . metoprolol succinate (TOPROL-XL) 24 hr tablet 25 mg  25 mg Oral Daily Raiford Noble [place], DO   25 mg at 10/04/20 0844  . ondansetron (ZOFRAN) injection 4 mg  4 mg Intravenous Once Lucrezia Starch, MD      . ondansetron [facility]) tablet 4 mg  4 mg Oral Q6H PRN Elwyn Reach, MD       Or  . ondansetron (ZOFRAN) injection 4 mg  4 mg Intravenous Q6H PRN Elwyn Reach, MD      . pantoprazole (PROTONIX) 80 mg in sodium chloride 0.9 % 100 mL (0.8 mg/mL) infusion  8 mg/hr Intravenous Continuous Sheikh, Omair Latif, DO      . pantoprazole (PROTONIX) 80 mg in sodium chloride 0.9 % 100 mL IVPB  80 mg Intravenous Once Sheikh, Omair Latif, DO      . [START ON 10/07/2020] pantoprazole (PROTONIX) injection 40 mg  40 mg Intravenous Q12H Sheikh, Omair Latif, DO      . rosuvastatin (CRESTOR) tablet 20 mg  20 mg Oral Daily Raiford Noble [place], DO   20 mg at 10/04/20 0844  . sodium chloride flush (NS) 0.9 % injection 10-40 mL  10-40 mL Intracatheter Q12H Sheikh, Omair Latif, DO      . sodium chloride flush (NS) 0.9 % injection 10-40 mL  10-40 mL Intracatheter PRN Sheikh, Omair Latif, DO      . sorbitol 70 % solution 30 mL  30 mL Oral PRN Elwyn Reach, MD      .  vitamin B-12 (CYANOCOBALAMIN) tablet 1,000 mcg  1,000 mcg Oral Daily Raiford Noble [place], DO   1,000 mcg at 10/04/20 7672  . zolpidem (AMBIEN) tablet 5 mg  5 mg Oral QHS PRN Elwyn Reach, MD        Allergies as of 10/02/2020 - Review Complete 10/02/2020  Allergen Reaction Noted  . Actos [pioglitazone] Swelling 03/21/2015  . Metformin and related Nausea Only 03/21/2015  . Niaspan [niacin er] Itching and Rash 08/09/2012    Family History  Problem Relation Age of Onset  . Heart disease Mother   . Coronary artery disease Father   . Cancer Maternal Grandmother   . Heart  Problems Maternal Grandfather   . Diabetes Son        borderline     Social History   Socioeconomic History  . Marital status: Married    Spouse name: Not on file  . Number of children: 3  . Years of education: 45  . Highest education level: Not on file  Occupational History  . Not on file  Tobacco Use  . Smoking status: Former Smoker    Packs/day: 2.00    Years: 20.00    Pack years: 40.00    Types: Cigarettes    Quit date: 01/25/1974    Years since quitting: 46.7  . Smokeless tobacco: Never Used  Vaping Use  . Vaping Use: Never used  Substance and Sexual Activity  . Alcohol use: No    Alcohol/week: 0.0 standard drinks  . Drug use: No  . Sexual activity: Not on file  Other Topics Concern  . Not on file  Social History Narrative   Lives with wife.    Social Determinants of Health   Financial Resource Strain:   . Difficulty of Paying Living Expenses: Not on file  Food Insecurity:   . Worried About Charity fundraiser in the Last Year: Not on file  . Ran Out of Food in the Last Year: Not on file  Transportation Needs:   . Lack of Transportation (Medical): Not on file  . Lack of Transportation (Non-Medical): Not on file  Physical Activity:   . Days of Exercise per Week: Not on file  . Minutes of Exercise per Session: Not on file  Stress:   . Feeling of Stress : Not on file  Social Connections:   . Frequency of Communication with Friends and Family: Not on file  . Frequency of Social Gatherings with Friends and Family: Not on file  . Attends Religious Services: Not on file  . Active Member of Clubs or Organizations: Not on file  . Attends Archivist Meetings: Not on file  . Marital Status: Not on file  Intimate Partner Violence:   . Fear of Current or Ex-Partner: Not on file  . Emotionally Abused: Not on file  . Physically Abused: Not on file  . Sexually Abused: Not on file    Review of Systems: Review of Systems  Constitutional: Positive for  malaise/fatigue. Negative for chills and fever.  HENT: Negative for sore throat.   Eyes: Negative for pain and redness.  Respiratory: Negative for cough, shortness of breath and stridor.   Cardiovascular: Negative for chest pain and palpitations.  Gastrointestinal: Positive for diarrhea, melena and nausea. Negative for abdominal pain, blood in stool, constipation, heartburn and vomiting.  Genitourinary: Negative for flank pain and hematuria.  Musculoskeletal: Negative for back pain and joint pain.  Skin: Negative for itching and rash.  Neurological: Positive  for loss of consciousness and weakness.  Endo/Heme/Allergies: Negative for polydipsia. Bruises/bleeds easily.  Psychiatric/Behavioral: Negative for substance abuse. The patient is not nervous/anxious.     Physical Exam: Vital signs in last 24 hours: Temp:  [97.4 F (36.3 C)-98.4 F (36.9 C)] 97.4 F (36.3 C) (10/22 0434) Pulse Rate:  [51-78] 51 (10/22 0434) Resp:  [16-18] 16 (10/22 0434) BP: (117-169)/(48-67) 169/67 (10/22 0434) SpO2:  [94 %-100 %] 100 % (10/22 0434) Last BM Date: 10/03/20 General: Lethatgic,  Well-developed, well-nourished, pleasant and cooperative in NAD Head: Normocephalic and atraumatic; wife at bedside. Eyes: Conjunctival pallor. Sclera clear, no icterus.   Mouth:  No ulcerations or lesions.  Oropharynx pink & moist. Neck:  No masses or thyromegaly. Lungs: Clear throughout to auscultation.   No wheezes, crackles, or rhonchi. No evident respiratory distress. Heart:  Regular rate and rhythm; +murmur Abdomen: Soft, nontender, and nondistended. No masses, hepatosplenomegaly or ventral hernias noted. Normal bowel sounds, without bruits, guarding, or rebound.   Msk:  Symmetrical without gross deformities. Pulses: Normal pulses noted. Extremities:  Without clubbing or edema. Neurologic: Lethargic but oriented and coherent;  grossly normal neurologically. Skin: Ecchymosis present on bilateral upper and lower  extremities. Cervical Nodes:  No significant cervical adenopathy. Psych:  Normal mood and affect.  Intake/Output from previous day: 10/21 0701 - 10/22 0700 In: 1608.2 [P.O.:720; I.V.:888.2] Out: 1500 [Urine:1500] Intake/Output this shift: Total I/O In: -  Out: 500 [Urine:500]  Lab Results: Recent Labs    10/02/20 1747 10/03/20 0809 10/04/20 0604  WBC 14.2* 10.1 6.0  HGB 8.4* 7.5* 6.6*  HCT 26.0* 23.0* 20.2*  PLT 292 233 197   BMET Recent Labs    10/02/20 1747 10/03/20 0809 10/04/20 0604  NA 137 140 140  K 4.9 4.2 4.1  CL 100 105 108  CO2 _0 GLUCOSE 247* 163* 174*  BUN 82* 78* 59*  CREATININE 1.70* 1.61* 1.42*  CALCIUM 8.5* 8.1* 8.1*   LFT Recent Labs    10/04/20 0604  PROT 5.0*  ALBUMIN 2.5*  AST 25  ALT 21  ALKPHOS 53  BILITOT 0.5   PT/INR No results for input(s): LABPROT, INR in the last 72 hours.  Studies/Results: DG Chest Portable 1 View  Result Date: 10/02/2020 CLINICAL DATA:  History of known lung cancer with weakness, initial encounter EXAM: PORTABLE CHEST 1 VIEW COMPARISON:  PET-CT from 08/22/2020 FINDINGS: Cardiac shadow is stable. Postsurgical changes are again seen. Bilateral soft tissue lung masses are noted consistent with the given clinical history. Elevation the right hemidiaphragm is again noted. No sizable effusion or pneumothorax is seen. No acute bony abnormality is noted. IMPRESSION: Bilateral masses consistent with the given clinical history of lung carcinoma. No acute abnormality noted. Electronically Signed   By: Inez Catalina M.D.   On: 10/02/2020 17:51    Impression: Suspected upper GI bleeding: Melena and 5 g drop in hemoglobin over the past 4 days.   -Hemoglobin 6.6 today.   -Elevated BUN out of proportion to creatinine over the last 2 days, suggestive of upper GI bleeding, though it should be noted that patient has CKD stage III.  BUN 59/Cr 1.42 today as compared to BUN 78/Cr 1.61 yesterday.  BUN was normal at 15 with  creatinine 1.38 four days ago.  A. fib, history of CVA: On Eliquis (last dose 10/21 PM) and 81 mg aspirin  CHF (LVEF by 3D volume is 48%)  Stage IV non-small cell lung cancer (on chemo)  Plan: Initiate Protonix  IV drip.  Due to lack of anesthesia availability in the endoscopy unit today, coupled with the fact that the patient had a regular diet for breakfast, we will proceed with EGD tomorrow.  I thoroughly discussed the procedure with the patient and the patient's wife to include nature, alternatives, benefits, and risks (including but not limited to bleeding, infection, perforation, anesthesia/cardiac and pulmonary complications).  Patient verbalized understanding and gave verbal consent to proceed with EGD.   Continue to hold Eliquis.  Clear liquid diet with NPO after midnight.  Continue to monitor H&H with transfusion as needed to maintain hemoglobin greater than 7-8.  [facility] will follow.   LOS: 2 days   Salley Slaughter  10/04/2020, 9:45 AM [phone]

## 2020-10-04 NOTE — Anesthesia Preprocedure Evaluation (Addendum)
Anesthesia Evaluation  Patient identified by MRN, date of birth, ID band Patient awake    Reviewed: Allergy & Precautions, NPO status , Patient's Chart, lab work & pertinent test results, reviewed documented beta blocker date and time   Airway Mallampati: III  TM Distance: >3 FB Neck ROM: Full    Dental no notable dental hx. (+) Teeth Intact, Dental Advisory Given   Pulmonary shortness of breath and with exertion, sleep apnea and Continuous Positive Airway Pressure Ventilation , former smoker,  Quit smoking 1975, 40 pack year history   Lung ca on chemo   Pulmonary exam normal  + decreased breath sounds      Cardiovascular hypertension, Pt. on medications and Pt. on home beta blockers + CAD, + Past MI, + Cardiac Stents (2005, on chronic eliquis- being held since admission), + CABG (CABG x 2 2016) and +CHF (LLVEF 45-50%)  Normal cardiovascular exam+ Valvular Problems/Murmurs (mild-mod AS) AS  Rhythm:Regular Rate:Normal  Last echo 03/22/20: 1. Left ventricular ejection fraction, by estimation, is 45 to 50%. Left  ventricular ejection fraction by 3D volume is 48 %. The left ventricle has  mildly decreased function. The left ventricle demonstrates global  hypokinesis. There is mild left  ventricular hypertrophy. Left ventricular diastolic parameters are  indeterminate. The average left ventricular global longitudinal strain is  -14.0 %.  2. Right ventricular systolic function is mildly reduced. The right  ventricular size is mildly enlarged. There is mildly elevated pulmonary  artery systolic pressure. The estimated right ventricular systolic  pressure is 00.9 mmHg.  3. The mitral valve is normal in structure. No evidence of mitral valve  regurgitation.  4. The aortic valve is abnormal. Moderately calcified. Aortic valve  regurgitation is trivial. Mild to moderate aortic valve stenosis. Vmax 2.8  m/s, MG 17 mmHg, AVA 1.1 m/s, DI  0.35  5. The inferior vena cava is normal in size with greater than 50%  respiratory variability, suggesting right atrial pressure of 3 mmHg.   Comparison(s): 04/01/17 EF 55-60%. Mild AS 30mHg mean, 224mg peak.    Neuro/Psych PSYCHIATRIC DISORDERS Depression Dementia CVA (L parietal)    GI/Hepatic Neg liver ROS, PUD, Melena, anemia   Endo/Other  diabetes, Well Controlled, Type 2, Insulin Dependent, Oral Hypoglycemic AgentsHypothyroidism Obesity BMI 38 Last a1c 6.4  Renal/GU Renal Insufficiency and CRFRenal diseaseCr 1.17 today from 1.42 yesterday, CKD 3  negative genitourinary   Musculoskeletal negative musculoskeletal ROS (+)   Abdominal (+) + obese,   Peds  Hematology  (+) Blood dyscrasia, anemia , H/H 6/18, plt 197 on presentation Today 9.5/29.3 s/p 2 units pRBCs yesterday    Anesthesia Other Findings stage IV lung cancer currently on chemo whose last chemo was 2 days ago that came to the ER with weakness shortness of breath as well as nausea.  No recent nausea or vomiting, denies hematemesis   Reproductive/Obstetrics negative OB ROS                           Anesthesia Physical Anesthesia Plan  ASA: IV  Anesthesia Plan: MAC   Post-op Pain Management:    Induction:   PONV Risk Score and Plan: 2 and Propofol infusion and TIVA  Airway Management Planned: Natural Airway and Simple Face Mask  Additional Equipment: None  Intra-op Plan:   Post-operative Plan:   Informed Consent: I have reviewed the patients History and Physical, chart, labs and discussed the procedure including the risks, benefits and alternatives for  the proposed anesthesia with the patient or authorized representative who has indicated his/her understanding and acceptance.       Plan Discussed with: CRNA  Anesthesia Plan Comments:        Anesthesia Quick Evaluation

## 2020-10-04 NOTE — Progress Notes (Signed)
CRITICAL VALUE ALERT  Critical Value:  Hgb 6.0  Date & Time Notied: 10/04/2020 1500  Provider NotifiedDr. Alfredia Ferguson  Orders Received/Actions taken: see new orders

## 2020-10-05 ENCOUNTER — Encounter (HOSPITAL_COMMUNITY)
Admission: EM | Disposition: A | Discharge status: Home Health | Payer: Self-pay | Source: Home / Self Care | Attending: Internal Medicine

## 2020-10-05 ENCOUNTER — Inpatient Hospital Stay (HOSPITAL_COMMUNITY): Payer: Medicare Other | Admitting: Anesthesiology

## 2020-10-05 ENCOUNTER — Encounter (HOSPITAL_COMMUNITY): Payer: Self-pay | Admitting: Internal Medicine

## 2020-10-05 DIAGNOSIS — I5032 Chronic diastolic (congestive) heart failure: Secondary | ICD-10-CM | POA: Diagnosis not present

## 2020-10-05 DIAGNOSIS — I2510 Atherosclerotic heart disease of native coronary artery without angina pectoris: Secondary | ICD-10-CM | POA: Diagnosis not present

## 2020-10-05 DIAGNOSIS — I350 Nonrheumatic aortic (valve) stenosis: Secondary | ICD-10-CM | POA: Diagnosis not present

## 2020-10-05 DIAGNOSIS — C3491 Malignant neoplasm of unspecified part of right bronchus or lung: Secondary | ICD-10-CM | POA: Diagnosis not present

## 2020-10-05 HISTORY — PX: ESOPHAGOGASTRODUODENOSCOPY: SHX5428

## 2020-10-05 LAB — GLUCOSE, CAPILLARY
Glucose-Capillary: 73 mg/dL (ref 70–99)
Glucose-Capillary: 88 mg/dL (ref 70–99)
Glucose-Capillary: 91 mg/dL (ref 70–99)
Glucose-Capillary: 177 mg/dL — ABNORMAL HIGH (ref 70–99)
Glucose-Capillary: 242 mg/dL — ABNORMAL HIGH (ref 70–99)

## 2020-10-05 LAB — TYPE AND SCREEN
ABO/RH(D): O NEG
Antibody Screen: NEGATIVE
Unit division: 0
Unit division: 0
Unit division: 0
Unit division: 0

## 2020-10-05 LAB — COMPREHENSIVE METABOLIC PANEL
ALT: 17 U/L (ref 0–44)
AST: 22 U/L (ref 15–41)
Albumin: 2.5 g/dL — ABNORMAL LOW (ref 3.5–5.0)
Alkaline Phosphatase: 56 U/L (ref 38–126)
Anion gap: 7 (ref 5–15)
BUN: 39 mg/dL — ABNORMAL HIGH (ref 8–23)
CO2: 28 mmol/L (ref 22–32)
Calcium: 8.4 mg/dL — ABNORMAL LOW (ref 8.9–10.3)
Chloride: 112 mmol/L — ABNORMAL HIGH (ref 98–111)
Creatinine, Ser: 1.17 mg/dL (ref 0.61–1.24)
GFR, Estimated: >60 mL/min (ref >60)
Glucose, Bld: 89 mg/dL (ref 70–99)
Potassium: 4.2 mmol/L (ref 3.5–5.1)
Sodium: 147 mmol/L — ABNORMAL HIGH (ref 135–145)
Total Bilirubin: 0.8 mg/dL (ref 0.3–1.2)
Total Protein: 5 g/dL — ABNORMAL LOW (ref 6.5–8.1)

## 2020-10-05 LAB — CBC WITH DIFFERENTIAL/PLATELET
Abs Immature Granulocytes: 0.03 10*3/uL (ref 0.00–0.07)
Basophils Absolute: 0 10*3/uL (ref 0.0–0.1)
Basophils Relative: 0 %
Eosinophils Absolute: 0 10*3/uL (ref 0.0–0.5)
Eosinophils Relative: 0 %
HCT: 26.2 % — ABNORMAL LOW (ref 39.0–52.0)
Hemoglobin: 8.6 g/dL — ABNORMAL LOW (ref 13.0–17.0)
Immature Granulocytes: 0 %
Lymphocytes Relative: 16 %
Lymphs Abs: 1.1 10*3/uL (ref 0.7–4.0)
MCH: 30.2 pg (ref 26.0–34.0)
MCHC: 32.8 g/dL (ref 30.0–36.0)
MCV: 91.9 fL (ref 80.0–100.0)
Monocytes Absolute: 0.1 10*3/uL (ref 0.1–1.0)
Monocytes Relative: 1 %
Neutro Abs: 5.7 10*3/uL (ref 1.7–7.7)
Neutrophils Relative %: 83 %
Platelets: 168 10*3/uL (ref 150–400)
RBC: 2.85 MIL/uL — ABNORMAL LOW (ref 4.22–5.81)
RDW: 15.1 % (ref 11.5–15.5)
WBC: 7 10*3/uL (ref 4.0–10.5)
nRBC: 0 % (ref 0.0–0.2)

## 2020-10-05 LAB — BPAM RBC
Blood Product Expiration Date: 202111222359
Blood Product Expiration Date: 202111222359
Blood Product Expiration Date: 202111222359
Blood Product Expiration Date: 202111222359
ISSUE DATE / TIME: 202110221609
ISSUE DATE / TIME: 202110222053
ISSUE DATE / TIME: 202110230021
Unit Type and Rh: 5100
Unit Type and Rh: 5100
Unit Type and Rh: 5100
Unit Type and Rh: 5100

## 2020-10-05 LAB — HEMOGLOBIN AND HEMATOCRIT, BLOOD
HCT: 29.3 % — ABNORMAL LOW (ref 39.0–52.0)
HCT: 28.8 % — ABNORMAL LOW (ref 39.0–52.0)
Hemoglobin: 9.5 g/dL — ABNORMAL LOW (ref 13.0–17.0)
Hemoglobin: 9.4 g/dL — ABNORMAL LOW (ref 13.0–17.0)

## 2020-10-05 LAB — PHOSPHORUS: Phosphorus: 3.9 mg/dL (ref 2.5–4.6)

## 2020-10-05 LAB — MAGNESIUM: Magnesium: 2 mg/dL (ref 1.7–2.4)

## 2020-10-05 SURGERY — EGD (ESOPHAGOGASTRODUODENOSCOPY)
Anesthesia: Monitor Anesthesia Care

## 2020-10-05 MED ORDER — DEXTROSE 5 % IV SOLN: INTRAVENOUS | Status: AC

## 2020-10-05 MED ORDER — PROPOFOL 500 MG/50ML IV EMUL
INTRAVENOUS | Status: DC | PRN
  Administered 2020-10-05 (×3): 30 mg via INTRAVENOUS
  Administered 2020-10-05: 50 mg via INTRAVENOUS

## 2020-10-05 MED ORDER — LACTATED RINGERS IV SOLN: INTRAVENOUS | Status: DC | PRN

## 2020-10-05 NOTE — Progress Notes (Signed)
PROGRESS NOTE    Randall Wilkins  EYC:[id number] DOB: [birth year] DOA: 10/02/2020 PCP: [name], MD   Brief Narrative:  HPI per Dr. Gala Romney on 10/02/20 Randall Wilkins is a 79 y.o. male with medical history significant of hypertension, diabetes, aortic stenosis, hypothyroidism, coronary artery disease status post PCI, chronic anticoagulation and stage IV lung cancer currently on chemo whose last chemo was 2 days ago that came to the ER with weakness shortness of breath as well as nausea.  Patient did get his chemo on Monday was feeling better at that time.  He went home but then started feeling weak and tired.  This morning he woke up with significant nausea.  He took some nausea medication and then suddenly became weak debilitated and not himself.  He was slightly confused so wife brought him to the ER where he was evaluated.  He was found to be significantly dehydrated.  Has acute on chronic kidney disease as well as elevated blood sugar.  Patient therefore being admitted to the hospital for evaluation.  ED Course: Temperature 98.6 blood pressure 130/54 pulse 90 respirate of 20 oxygen sat 90% on room air.  White count is 14 point hemoglobin 8.4 platelet count of 292.  Chemistry showed BUN of 82 creatinine 1.70 and calcium 8.5.  Glucose is 247.  Troponin is 55 and 52.  COVID-19 screen is negative.  Chest x-ray shows bilateral masses consistent with lung cancer.  Patient being admitted for further evaluation and treatment.  **Interim History  Patient states he is feeling a little bit better today but his renal function still elevated. We will continue him fluid hydration and obtain a PT and OT evaluation given his weakness.  10/04/20: Overnight patient had dark black tarry stools and his hemoglobin further dropped and is 6.6 today.  Initially thought that he had anemia due to his chemotherapy but now feels like he is actively having a GI bleed.  We will type and screen and transfuse 1  unit of PRBCs and place the patient on Protonix drip and hold his Eliquis and aspirin.  GI has been consulted and FOBT has been ordered.  GI planning on doing EGD tomorrow given the patient ate breakfast today and because there is a lack of anesthesia availability in the endoscopy unit today.  Patient is to be n.p.o. after midnight and will be placed on a clear liquid diet today with his Eliquis being held.  10/05/20: Did not have any more bleeding overnight per patient.  He is to undergo endoscopy later this afternoon and his sodium and chloride was elevated so we will start gentle IV fluid hydration with D5W to bring this down.  His blood count has adequately responded to the 2 units of transfusion and repeat this morning was 9.5/29.3.  Renal function is improved as well and will discuss with surgery about possibly getting a port while he is in-house.   Assessment & Plan:   Active Problems:   Diabetes 1.5, managed as type 1 ([place])   CAD S/P percutaneous coronary angioplasty   Aortic valve stenosis   Acute renal failure (HCC)   Chronic diastolic congestive heart failure (HCC)   Chronic kidney disease (CKD), stage III (moderate) (HCC)   Essential hypertension   Adenocarcinoma of right lung, stage 4 (HCC)   ARF (acute renal failure) (HCC)  Acute on chronic kidney disease stage IIIa , improving and is thought to be dehydration but now likely in setting of GI bleeding and diuretic  use  -Patient has known history of chronic kidney disease stage IIIa.  -Currently dehydrated as a result of chemo and diuretics. -Will need to Clarify Diuretics as he has Furosemide and Torsemide listed both on his MAR; see below -Wife is giving him some fluids but appears to be dehydrated. -Initially given aggressive IV fluid hydration with normal saline at 125 mL's per hour but will decrease rate to 75 MLS per hour for 1 day and this was stopped but will resume fluids at D5W given his hypernatremia and  hyperchloremia -BUN/creatinine on 09/30/2020 was 15/1.38 and slightly worsened to 82/1.70; now slowly improving and went to 78/1.61 -> is 59/1.42 -> 39/1.17 -Patient was given IV fluid hydration with normal saline at 75 MLS per hour and this is now expired and stopped but we will resume D5W at 75 mils per hour for now given his hypernatremia and hyperchloremia -Avoid nephrotoxic medications and will hold his furosemide/torsemide, lisinopril, allopurinol, Metformin, and tamsulosin -Avoid contrast dyes, and renally adjust medications -Continue to monitor and trend renal function panel and repeat CMP in a.m.  Dehydration, improving -Initially started on aggressive hydration and will hold his furosemide/torsemide; apparently the patient has been taking 80 mg of furosemide in the morning and 10 mg of torsemide in the evening without patient's PCP not.  He is only prescribed furosemide 80 mg p.o. 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 daily before breakfast  History of gout -Currently holding allopurinol 300 mg p.o. daily given his worsened renal function  Chronic Diastolic CHF  -BNP on admission was 344.4 -Currently appears dehydrated and was given IV fluid  resuscitation and have change the rate from 125 MLS per hour to 75 MLS per hour for 1 day and this is now been stopped and he is getting a unit of blood yesterday; fluids have been stopped but will resume today D5W -We will hold his furosemide and discontinue his torsemide -Continue with metoprolol succinate 25 mg p.o. daily and will hold his lisinopril 10 mg p.o. daily for now -Strict I's and O's and daily weights; is +48 mL since admission and weight is 240 pounds but not repeated the last few days -We will continue to monitor for signs and symptoms of volume overload and repeat a chest x-ray in a.m.  Insulin-Dependent Diabetes Mellitus Type 2 complicated by  -Resume home Tresiba 46 units SQ nightly and hold his home Metformin and Trulicity as well as a sliding scale insulin -We will start on a moderate NovoLog/scale insulin before meals and at bedtime -Continue to monitor CBGs carefully is ranging from 73-192 -We will start D5W given his hypernatremia and hyperchloremia -As hemoglobin A1c was 6.4 on 05/20/2020  History of  CAD status post CABG x2 -We will hold his aspirin 81 mg daily due to his GI bleeding and will continue metoprolol succinate 25 mg p.o. daily, and will hold his lisinopril 10 mg daily -resume rosuvastatin 20 mg nightly -Currently denies any chest pain  History of left parietal CVA with small lacunar infarcts -Discontinued aspirin 81 mg p.o. daily as well as apixaban 5 milligrams p.o. twice daily for now given his GI bleeding but will continue simvastatin 20 once daily  Paroxysmal Atrial Fibrillation (New Onset Earlier this year) -CHA2DS2-VASc score of 6 -We will hold apixaban 5 mg p.o. twice daily given that he is having a GI bleed now despite having history of CVA -Continue telemetry Monitoring -Continue with metoprolol succinate 25 mg daily  Dyslipidemia/Hyperlipidemia -Continue rosuvastatin 20 mg daily  BPH -Continue to hold his tamsulosin 0.4 mg p.o.  daily  Anorexia -Patient has significant anorexia and nausea.   -Symptomatically treat. -We will consult nutritionist for further evaluation recommendation  Hypernatremia/Hyperchloremia -Patient sodium trended up to 147 his chloride level of 112 -Started D5W at 75 mils per hour for 12 hours -Continue monitor and repeat CMP in a.m.  Obesity -Complicates overall prognosis and care -Estimated body mass index is 37.59 kg/m as calculated from the following:   Height as of this encounter: 5\' 7"  (1.702 m).   Weight as of this encounter: 108.9 kg. -Weight Loss and Dietary Counseling given   OSA -C/w CPAP nightly   Normocytic Anemia/Anemia secondary to Antineoplastic therapy/acute blood loss anemia -Patient's hemoglobin/hematocrit on admission was 8.4/26.0 and then trended down to 7.5/23.0 today and today was 6.6/20.2 -Checked anemia panel and showed an iron level of 192, TIBC of 17, TIBC of 209, saturation ratios of 92%, ferritin level 14, folate levels 22.3, and vitamin B12 of 3201 -Initially thought to be from antineoplastic therapy and dilutional drop however patient was having significant melanotic and dark tarry stools overnight -Because his acute drop in hemoglobin to 6.6 we will type and screen and transfuse 1 unit PRBCs -We will hold his Eliquis and his aspirin -We will start 2 large-bore IVs and place the patient on Protonix drip -We will obtain FOBT; this is pending to be done -Hemoglobin/hematocrit after blood transfusion trended up to 8.6/26.2 this morning and then repeat this morning was also eight 9.5/29.3 -GI consulted for further evaluation recommendations and because the patient ate today and because there is no availability endoscopy unit today his EGD will be postponed until tomorrow -Continue to monitor for signs and symptoms of bleeding; currently having melanotic and dark tarry stools now -Repeat CBC in a.m.  Leukocytosis, improved -Likely hemoconcentration in the  setting of dehydration  -WBC on admission was 14.2 ->10.1 -> 6.0 -> 7.0 -Continue to monitor for signs and symptoms of infection next-repeat CBC in a.m  Generalized Weakness -In the setting of dehydration and acute blood loss anemia -PT/OT to Evaluate and Treat  DVT prophylaxis: Anticoagulated with Apixaban Code Status: FULL CODE  Family Communication: Discussed with the wife at bedside Disposition Plan: Pending further clinical improvement and back to baseline and evaluation by PT OT  Status is: Inpatient  Remains inpatient appropriate because:Unsafe d/c plan, IV treatments appropriate due to intensity of illness or inability to take PO and Inpatient level of care appropriate due to severity of illness   Dispo: The patient is from: Home              Anticipated d/c is to: TBD  Anticipated d/c date is:1-2 days              Patient currently is not medically stable to d/c.  Consultants:  Gastroenterology Notified Oncology via epic   Procedures:   EGD to be done today  Antimicrobials:  Anti-infectives (From admission, onward)   None       Subjective: Seen and examined at bedside and feels as if he is doing better today.  Denied any nausea or vomiting.  No abdominal pain.  He did not have any more bowel movement overnight.  Ready for his endoscopy.  Feels a little bit stronger.  No other concerns or complaints at this time but asking if his port can be done in-house.  Objective: Vitals:   10/04/20 2115 10/04/20 2216 10/05/20 0031 10/05/20 0520  BP: (!) 146/60  (!) 144/70 (!) 157/56  Pulse: 75  67 75  Resp: 16 18 16 18   Temp: (!) 97.3 F (36.3 C)  97.7 F (36.5 C) 98.3 F (36.8 C)  TempSrc: Oral  Oral Oral  SpO2: 99%  96% 99%  Weight:      Height:        Intake/Output Summary (Last 24 hours) at 10/05/2020 1109 Last data filed at 10/05/2020 1000 Gross per 24 hour  Intake 2284.24 ml  Output 2750 ml  Net -465.76 ml   Filed Weights   10/02/20 1640   Weight: 108.9 kg   Examination: Physical Exam:  Constitutional: WN/WD obese Caucasian male currently no acute distress sitting in chair at bedside and appears more comfortable today than he did yesterday Eyes: Lids and conjunctivae normal, sclerae anicteric  ENMT: External Ears, Nose appear normal.  Hard of hearing Neck: Appears normal, supple, no cervical masses, normal ROM, no appreciable thyromegaly; no JVD Respiratory: Diminished to auscultation bilaterally, no wheezing, rales, rhonchi or crackles. Normal respiratory effort and patient is not tachypenic. No accessory muscle use.  Unlabored breathing Cardiovascular: RRR, no murmurs / rubs / gallops. S1 and S2 auscultated.  Trace extremity edema and chronic venous stasis changes with darkening skin pigmentation L extremities Abdomen: Soft, non-tender, distended secondary by habitus.  Bowel sounds positive.  GU: Deferred. Musculoskeletal: No clubbing / cyanosis of digits/nails. No joint deformity upper and lower extremities.  Skin: No rashes, lesions, ulcers on limited skin evaluation but does have some bruising ecchymosis upper extremities. No induration; Warm and dry.  Neurologic: CN 2-12 grossly intact with no focal deficits. Romberg sign and cerebellar reflexes not assessed.  Psychiatric: Normal judgment and insight. Alert and oriented x 3. Normal mood and appropriate affect.   Data Reviewed: I have personally reviewed following labs and imaging studies  CBC: Recent Labs  Lab 09/30/20 0818 09/30/20 0818 10/02/20 1747 10/02/20 1747 10/03/20 0809 10/04/20 0604 10/04/20 1403 10/05/20 0309 10/05/20 0846  WBC 8.2  --  14.2*  --  10.1 6.0  --  7.0  --   NEUTROABS 4.2  --  11.0*  --  8.0* 5.0  --  5.7  --   HGB 11.7*  --  8.4*   < > 7.5* 6.6* 6.0* 8.6* 9.5*  HCT 36.4*   < > 26.0*   < > 23.0* 20.2* 18.4* 26.2* 29.3*  MCV 90.5  --  95.2  --  93.5 92.2  --  91.9  --   PLT 221  --  292  --  233 197  --  168  --    < > = values in  this interval not displayed.  Basic Metabolic Panel: Recent Labs  Lab 09/30/20 0818 10/02/20 1747 10/03/20 0809 10/04/20 0604 10/05/20 0309  NA 142 137 140 140 147*  K 3.8 4.9 4.2 4.1 4.2  CL 104 100 105 108 112*  CO2 31 26 23 25 28   GLUCOSE 103* 247* 163* 174* 89  BUN 15 82* 78* 59* 39*  CREATININE 1.38* 1.70* 1.61* 1.42* 1.17  CALCIUM 9.2 8.5* 8.1* 8.1* 8.4*  MG  --   --  2.1 2.1 2.0  PHOS  --   --  4.4 3.9 3.9   GFR: Estimated Creatinine Clearance: 60.2 mL/min (by C-G formula based on SCr of 1.17 mg/dL). Liver Function Tests: Recent Labs  Lab 09/30/20 0818 10/02/20 1747 10/03/20 0809 10/04/20 0604 10/05/20 0309  AST 17 29 30 25 22   ALT 13 20 19 21 17   ALKPHOS 95 60 54 53 56  BILITOT 0.3 0.4 0.4 0.5 0.8  PROT 6.1* 5.2* 5.2* 5.0* 5.0*  ALBUMIN 2.6* 2.6* 2.6* 2.5* 2.5*   Recent Labs  Lab 10/02/20 1747  LIPASE 28   No results for input(s): AMMONIA in the last 168 hours. Coagulation Profile: No results for input(s): INR, PROTIME in the last 168 hours. Cardiac Enzymes: No results for input(s): CKTOTAL, CKMB, CKMBINDEX, TROPONINI in the last 168 hours. BNP (last 3 results) No results for input(s): PROBNP in the last 8760 hours. HbA1C: No results for input(s): HGBA1C in the last 72 hours. CBG: Recent Labs  Lab 10/04/20 0745 10/04/20 1157 10/04/20 1708 10/04/20 2107 10/05/20 0729  GLUCAP 150* 192* 171* 134* 73   Lipid Profile: No results for input(s): CHOL, HDL, LDLCALC, TRIG, CHOLHDL, LDLDIRECT in the last 72 hours. Thyroid Function Tests: No results for input(s): TSH, T4TOTAL, FREET4, T3FREE, THYROIDAB in the last 72 hours. Anemia Panel: Recent Labs    10/04/20 0604  VITAMINB12 3,201*  FOLATE 22.3  FERRITIN 410*  TIBC 209*  IRON 192*  RETICCTPCT 1.4   Sepsis Labs: No results for input(s): PROCALCITON, LATICACIDVEN in the last 168 hours.  Recent Results (from the past 240 hour(s))  Respiratory Panel by RT PCR (Flu A&B, Covid) -  Nasopharyngeal Swab     Status: None   Collection Time: 10/02/20  5:57 PM   Specimen: Nasopharyngeal Swab  Result Value Ref Range Status   SARS Coronavirus 2 by RT PCR NEGATIVE NEGATIVE Final    Comment: (NOTE) SARS-CoV-2 target nucleic acids are NOT DETECTED.  The SARS-CoV-2 RNA is generally detectable in upper respiratoy specimens during the acute phase of infection. The lowest concentration of SARS-CoV-2 viral copies this assay can detect is 131 copies/mL. A negative result does not preclude SARS-Cov-2 infection and should not be used as the sole basis for treatment or other patient management decisions. A negative result may occur with  improper specimen collection/handling, submission of specimen other than nasopharyngeal swab, presence of viral mutation(s) within the areas targeted by this assay, and inadequate number of viral copies (<131 copies/mL). A negative result must be combined with clinical observations, patient history, and epidemiological information. The expected result is Negative.  Fact Sheet for Patients:  PinkCheek.be  Fact Sheet for Healthcare Providers:  GravelBags.it  This test is no t yet approved or cleared by the [place] FDA and  has been authorized for detection and/or diagnosis of SARS-CoV-2 by FDA under an Emergency Use Authorization (EUA). This EUA will remain  in effect (meaning this test can be used) for the duration of the COVID-19 declaration under Section 564(b)(1) of the  Act, 21 U.S.C. section 360bbb-3(b)(1), unless the authorization is terminated or revoked sooner.     Influenza A by PCR NEGATIVE NEGATIVE Final   Influenza B by PCR NEGATIVE NEGATIVE Final    Comment: (NOTE) The Xpert Xpress SARS-CoV-2/FLU/RSV assay is intended as an aid in  the diagnosis of influenza from Nasopharyngeal swab specimens and  should not be used as a sole basis for treatment. Nasal washings and   aspirates are unacceptable for Xpert Xpress SARS-CoV-2/FLU/RSV  testing.  Fact Sheet for Patients: PinkCheek.be  Fact Sheet for Healthcare Providers: GravelBags.it  This test is not yet approved or cleared by the [place] FDA and  has been authorized for detection and/or diagnosis of SARS-CoV-2 by  FDA under an Emergency Use Authorization (EUA). This EUA will remain  in effect (meaning this test can be used) for the duration of the  Covid-19 declaration under Section 564(b)(1) of the Act, 21  U.S.C. section 360bbb-3(b)(1), unless the authorization is  terminated or revoked. Performed at [facility], [place] [address] [place], [place] [address]     RN Pressure Injury Documentation:     Estimated body mass index is 37.59 kg/m as calculated from the following:   Height as of this encounter: 5\' 7"  (1.702 m).   Weight as of this encounter: 108.9 kg.  Malnutrition Type:  Nutrition Problem: Increased nutrient needs Etiology: cancer and cancer related treatments   Malnutrition Characteristics:  Signs/Symptoms: estimated needs   Nutrition Interventions:  Interventions: Glucerna shake Radiology Studies: No results found. Scheduled Meds: . sodium chloride   Intravenous Once  . donepezil  10 mg Oral QHS  . feeding supplement (GLUCERNA SHAKE)  237 mL Oral TID BM  . folic acid  1 mg Oral Daily  . insulin aspart  0-15 Units Subcutaneous TID WC  . insulin aspart  0-5 Units Subcutaneous QHS  . insulin glargine  46 Units Subcutaneous QHS  . levothyroxine  75 mcg Oral QAC breakfast  . metoprolol succinate  25 mg Oral Daily  . ondansetron (ZOFRAN) IV  4 mg Intravenous Once  . [START ON 10/07/2020] pantoprazole  40 mg Intravenous Q12H  . rosuvastatin  20 mg Oral Daily  . sodium chloride flush  10-40 mL Intracatheter Q12H  . vitamin B-12  1,000 mcg Oral Daily   Continuous Infusions: . dextrose 75  mL/hr at 10/05/20 0807  . pantoprozole (PROTONIX) infusion 8 mg/hr (10/05/20 0304)    LOS: 3 days   Kerney Elbe, DO [facility] PAGER is on AMION  If 7PM-7AM, please contact night-coverage [email or web address]

## 2020-10-05 NOTE — Progress Notes (Signed)
PT Cancellation Note  Patient Details Name: Deral S Martinique MRN: [id number] DOB: 05-31-1941   Cancelled Treatment:    Reason Eval/Treat Not Completed: Medical issues which prohibited therapy;Patient at procedure or test/unavailable  - pt awaiting EGD today for suspected GIB, will check back post-procedure.   [place] Pager [phone]  Office [phone]     Julien Girt 10/05/2020, 10:52 AM

## 2020-10-05 NOTE — Anesthesia Postprocedure Evaluation (Signed)
Anesthesia Post Note  Patient: Gurfateh S Martinique  Procedure(s) Performed: ESOPHAGOGASTRODUODENOSCOPY (EGD) (N/A )     Patient location during evaluation: PACU Anesthesia Type: MAC Level of consciousness: awake and alert Pain management: pain level controlled Vital Signs Assessment: post-procedure vital signs reviewed and stable Respiratory status: spontaneous breathing, nonlabored ventilation and respiratory function stable Cardiovascular status: blood pressure returned to baseline and stable Postop Assessment: no apparent nausea or vomiting Anesthetic complications: no   No complications documented.  Last Vitals:  Vitals:   10/05/20 1303 10/05/20 1331  BP: (!) 115/49 (!) 141/50  Pulse: (!) 55 (!) 55  Resp: (!) 21 18  Temp:  36.6 C  SpO2: 97% 100%    Last Pain:  Vitals:   10/05/20 1331  TempSrc: Oral  PainSc:                  Pervis Hocking

## 2020-10-05 NOTE — Transfer of Care (Signed)
Immediate Anesthesia Transfer of Care Note  Patient: Randall Wilkins  Procedure(s) Performed: Procedure(s): ESOPHAGOGASTRODUODENOSCOPY (EGD) (N/A)  Patient Location: PACU and Endoscopy Unit  Anesthesia Type:MAC  Level of Consciousness: awake, alert  and oriented  Airway & Oxygen Therapy: Patient Spontanous Breathing and Patient connected to nasal cannula oxygen  Post-op Assessment: Report given to RN and Post -op Vital signs reviewed and stable  Post vital signs: Reviewed and stable  Last Vitals:  Vitals:   10/05/20 1204 10/05/20 1215  BP: (!) 210/53 (!) 189/72  Pulse: (!) 7   Resp: (!) 24   Temp: 36.8 C   SpO2: 57%     Complications: No apparent anesthesia complications

## 2020-10-05 NOTE — Op Note (Signed)
[facility] Patient Name: Randall Wilkins Procedure Date: 10/05/2020 MRN: [id number] Attending MD: Arta Silence , MD Date of Birth: 1941/10/12 CSN: [id number] Age: 79 Admit Type: Inpatient Procedure:                Upper GI endoscopy Indications:              Acute post hemorrhagic anemia, Melena Providers:                Arta Silence, MD, Benetta Spar RN, RN, Laverda Sorenson, Technician, Eliberto Ivory, CRNA Referring MD:             [facility] Medicines:                Monitored Anesthesia Care Complications:            No immediate complications. Estimated Blood Loss:     Estimated blood loss: none. Procedure:                Pre-Anesthesia Assessment:                           - Prior to the procedure, a History and Physical                            was performed, and patient medications and                            allergies were reviewed. The patient's tolerance of                            previous anesthesia was also reviewed. The risks                            and benefits of the procedure and the sedation                            options and risks were discussed with the patient.                            All questions were answered, and informed consent                            was obtained. Prior Anticoagulants: The patient has                            taken Eliquis (apixaban), last dose was 2 days                            prior to procedure. ASA Grade Assessment: IV - A                            patient with severe systemic disease that is a  constant threat to life. After reviewing the risks                            and benefits, the patient was deemed in                            satisfactory condition to undergo the procedure.                           After obtaining informed consent, the endoscope was                            passed under direct vision. Throughout the                             procedure, the patient's blood pressure, pulse, and                            oxygen saturations were monitored continuously. The                            GIF-H190 ([id number] Olympus gastroscope was                            introduced through the mouth, and advanced to the                            second part of duodenum. The upper GI endoscopy was                            accomplished without difficulty. The patient                            tolerated the procedure well. Scope In: Scope Out: Findings:      A small hiatal hernia was present.      One benign-appearing, intrinsic mild stenosis was found. The stenosis       was traversed.      LA Grade A (one or more mucosal breaks less than 5 mm, not extending       between tops of 2 mucosal folds) esophagitis was found.      The exam of the esophagus was otherwise normal.      A few dispersed small erosions with no stigmata of recent bleeding were       found in the gastric body and in the gastric antrum.      The exam of the stomach was otherwise normal.      Mild inflammation was found in the duodenal bulb.      An acquired moderate stenosis was found in the second portion of the       duodenum and was non-traversed.      No old or fresh blood was seen to the extent of our examination. Impression:               - Small hiatal hernia.                           -  Benign-appearing esophageal stenosis.                           - LA Grade A esophagitis.                           - Erosive gastropathy with no stigmata of recent                            bleeding.                           - Bulbar duodenitis.                           - Acquired duodenal stenosis. Likely peptic                            stricture from prior episode of duodenal ulcer                            disease noted on EGD in 2016.                           - Overall bleeding most likely from esophagitis. Moderate Sedation:       None Recommendation:           - Return patient to hospital ward for ongoing care.                           - Clear liquid diet today.                           - Continue present medications, including PPI.                           [name] will follow. Procedure Code(s):        --- Professional ---                           [phone], Esophagogastroduodenoscopy, flexible,                            transoral; diagnostic, including collection of                            specimen(s) by brushing or washing, when performed                            (separate procedure) Diagnosis Code(s):        --- Professional ---                           K44.9, Diaphragmatic hernia without obstruction or                            gangrene  K22.2, Esophageal obstruction                           K20.90, Esophagitis, unspecified without bleeding                           K31.89, Other diseases of stomach and duodenum                           K29.80, Duodenitis without bleeding                           K31.5, Obstruction of duodenum                           D62, Acute posthemorrhagic anemia                           K92.1, Melena (includes Hematochezia) CPT copyright 2019 [employer]. All rights reserved. The codes documented in this report are preliminary and upon coder review may  be revised to meet current compliance requirements. Arta Silence, MD 10/05/2020 12:55:29 PM This report has been signed electronically. Number of Addenda: 0

## 2020-10-05 NOTE — Interval H&P Note (Signed)
History and Physical Interval Note:  10/05/2020 12:19 PM  Randall Wilkins  has presented today for surgery, with the diagnosis of melena, anemia.  The various methods of treatment have been discussed with the patient and family. After consideration of risks, benefits and other options for treatment, the patient has consented to  Procedure(s): ESOPHAGOGASTRODUODENOSCOPY (EGD) (N/A) as a surgical intervention.  The patient's history has been reviewed, patient examined, no change in status, stable for surgery.  I have reviewed the patient's chart and labs.  Questions were answered to the patient's satisfaction.     Landry Dyke

## 2020-10-05 NOTE — Progress Notes (Signed)
OT Cancellation Note  Patient Details Name: Randall Wilkins MRN: [id number] DOB: 1941/03/02   Cancelled Treatment:    Reason Eval/Treat Not Completed: Patient at procedure or test/ unavailable  Jaliya Siegmann L Lulu Hirschmann 10/05/2020, 2:24 PM

## 2020-10-06 DIAGNOSIS — C3491 Malignant neoplasm of unspecified part of right bronchus or lung: Secondary | ICD-10-CM | POA: Diagnosis not present

## 2020-10-06 DIAGNOSIS — I350 Nonrheumatic aortic (valve) stenosis: Secondary | ICD-10-CM | POA: Diagnosis not present

## 2020-10-06 DIAGNOSIS — I2510 Atherosclerotic heart disease of native coronary artery without angina pectoris: Secondary | ICD-10-CM | POA: Diagnosis not present

## 2020-10-06 DIAGNOSIS — I5032 Chronic diastolic (congestive) heart failure: Secondary | ICD-10-CM | POA: Diagnosis not present

## 2020-10-06 LAB — COMPREHENSIVE METABOLIC PANEL
ALT: 17 U/L (ref 0–44)
AST: 25 U/L (ref 15–41)
Albumin: 2.7 g/dL — ABNORMAL LOW (ref 3.5–5.0)
Alkaline Phosphatase: 60 U/L (ref 38–126)
Anion gap: 8 (ref 5–15)
BUN: 29 mg/dL — ABNORMAL HIGH (ref 8–23)
CO2: 26 mmol/L (ref 22–32)
Calcium: 8.2 mg/dL — ABNORMAL LOW (ref 8.9–10.3)
Chloride: 109 mmol/L (ref 98–111)
Creatinine, Ser: 1.14 mg/dL (ref 0.61–1.24)
GFR, Estimated: >60 mL/min (ref >60)
Glucose, Bld: 60 mg/dL — ABNORMAL LOW (ref 70–99)
Potassium: 4 mmol/L (ref 3.5–5.1)
Sodium: 143 mmol/L (ref 135–145)
Total Bilirubin: 0.8 mg/dL (ref 0.3–1.2)
Total Protein: 5.3 g/dL — ABNORMAL LOW (ref 6.5–8.1)

## 2020-10-06 LAB — CBC WITH DIFFERENTIAL/PLATELET
Abs Immature Granulocytes: 0.01 10*3/uL (ref 0.00–0.07)
Basophils Absolute: 0.1 10*3/uL (ref 0.0–0.1)
Basophils Relative: 1 %
Eosinophils Absolute: 0 10*3/uL (ref 0.0–0.5)
Eosinophils Relative: 0 %
HCT: 26.6 % — ABNORMAL LOW (ref 39.0–52.0)
Hemoglobin: 8.6 g/dL — ABNORMAL LOW (ref 13.0–17.0)
Immature Granulocytes: 0 %
Lymphocytes Relative: 18 %
Lymphs Abs: 1.2 10*3/uL (ref 0.7–4.0)
MCH: 29.8 pg (ref 26.0–34.0)
MCHC: 32.3 g/dL (ref 30.0–36.0)
MCV: 92 fL (ref 80.0–100.0)
Monocytes Absolute: 0.1 10*3/uL (ref 0.1–1.0)
Monocytes Relative: 1 %
Neutro Abs: 5.4 10*3/uL (ref 1.7–7.7)
Neutrophils Relative %: 80 %
Platelets: 148 10*3/uL — ABNORMAL LOW (ref 150–400)
RBC: 2.89 MIL/uL — ABNORMAL LOW (ref 4.22–5.81)
RDW: 14.9 % (ref 11.5–15.5)
WBC: 6.7 10*3/uL (ref 4.0–10.5)
nRBC: 0 % (ref 0.0–0.2)

## 2020-10-06 LAB — GLUCOSE, CAPILLARY
Glucose-Capillary: 38 mg/dL — CL (ref 70–99)
Glucose-Capillary: 49 mg/dL — ABNORMAL LOW (ref 70–99)
Glucose-Capillary: 140 mg/dL — ABNORMAL HIGH (ref 70–99)
Glucose-Capillary: 134 mg/dL — ABNORMAL HIGH (ref 70–99)
Glucose-Capillary: 101 mg/dL — ABNORMAL HIGH (ref 70–99)
Glucose-Capillary: 156 mg/dL — ABNORMAL HIGH (ref 70–99)

## 2020-10-06 LAB — MAGNESIUM: Magnesium: 1.9 mg/dL (ref 1.7–2.4)

## 2020-10-06 LAB — PHOSPHORUS: Phosphorus: 3.7 mg/dL (ref 2.5–4.6)

## 2020-10-06 MED ORDER — INSULIN GLARGINE 100 UNIT/ML ~~LOC~~ SOLN
32.0000 [IU] | Freq: Every day | SUBCUTANEOUS | Status: DC
  Administered 2020-10-06: 32 [IU] via SUBCUTANEOUS
  Filled 2020-10-06: qty 0.32

## 2020-10-06 MED ORDER — INSULIN ASPART 100 UNIT/ML ~~LOC~~ SOLN
0.0000 [IU] | Freq: Three times a day (TID) | SUBCUTANEOUS | Status: DC
  Administered 2020-10-06: 1 [IU] via SUBCUTANEOUS

## 2020-10-06 MED ORDER — DEXTROSE 50 % IV SOLN
INTRAVENOUS | Status: AC
  Filled 2020-10-06: qty 50

## 2020-10-06 MED ORDER — DEXTROSE 50 % IV SOLN
1.0000 | Freq: Once | INTRAVENOUS | Status: AC
  Administered 2020-10-06: 50 mL via INTRAVENOUS

## 2020-10-06 MED ORDER — PANTOPRAZOLE SODIUM 40 MG PO TBEC
40.0000 mg | DELAYED_RELEASE_TABLET | Freq: Two times a day (BID) | ORAL | Status: DC
  Administered 2020-10-06 – 2020-10-08 (×4): 40 mg via ORAL
  Filled 2020-10-06 (×4): qty 1

## 2020-10-06 NOTE — Evaluation (Signed)
Occupational Therapy Evaluation Patient Details Name: Randall Wilkins MRN: [id number] DOB: [birth year] Today's Date: 10/06/2020    History of Present Illness Patient is 79 y.o. 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 Pt presented to [facility] via EMS after feelign weak, nauseaous at home. Pt's wife reports she helped him slowly lower to the ground on his knees and called for EMS as he briefly lost consciousness. Pt admitted for AKI. s/p EGD.   Clinical Impression   Randall Wilkins is a 79 year old man admitted with the above medical history who presents with normal ROM and strength of upper extremities, ability to perform baseline ADLs (needs assistance with LB dressing) and demonstrated ability to ambulate to bathroom with RW and perform toilet transfer. Patient appears to be at his baseline in regards to self care tasks. Patient has all needed DME at home and the support of his wife and family. No OT needs at this time.     Follow Up Recommendations  No OT follow up    Equipment Recommendations  None recommended by OT    Recommendations for Other Services       Precautions / Restrictions Precautions Precautions: Fall Restrictions Weight Bearing Restrictions: No      Mobility Bed Mobility               General bed mobility comments: Up in recliner.    Transfers Overall transfer level: Modified independent Equipment used: Rolling walker (2 wheeled) Transfers: Sit to/from [facility] Sit to Stand: Min guard Stand pivot transfers: Min guard       General transfer comment: Min guard (for safety) to ambulate and perform toilet transfer. Use of RW and grab bars.    Balance Overall balance assessment: Mild deficits observed, not formally tested                                         ADL either performed or assessed with clinical judgement   ADL Overall  ADL's : At baseline                                       General ADL Comments: Needs assistance for LB dressing.     Vision   Vision Assessment?: No apparent visual deficits     Perception     Praxis      Pertinent Vitals/Pain Pain Assessment: No/denies pain     Hand Dominance Right   Extremity/Trunk Assessment Upper Extremity Assessment Upper Extremity Assessment: Overall WFL for tasks assessed   Lower Extremity Assessment Lower Extremity Assessment: Defer to PT evaluation   Cervical / Trunk Assessment Cervical / Trunk Assessment: Kyphotic   Communication Communication Communication: HOH   Cognition Arousal/Alertness: Awake/alert Behavior During Therapy: WFL for tasks assessed/performed Overall Cognitive Status: Within Functional Limits for tasks assessed                                     General Comments       Exercises     Shoulder Instructions      Home Living Family/patient expects to be discharged to:: Private residence Living Arrangements: Spouse/significant other;Children Available Help  at Discharge: Family;Available 24 hours/day Type of Home: House Home Access: Ramped entrance     Home Layout: One level     Bathroom Shower/Tub: Occupational psychologist: Handicapped height Bathroom Accessibility: No   Home Equipment: Grab bars - tub/shower;Walker - 2 wheels;Hand held Tourist information centre manager - 4 wheels;Wheelchair - power;Electric scooter;Shower seat - built in;Grab bars - toilet          Prior Functioning/Environment Level of Independence: Independent with assistive device(s);Needs assistance  Gait / Transfers Assistance Needed: pt hase AFO for Lt LE and ambulated short household distances with RW. he has an Transport planner and power wheelchair for [place] distances.  ADL's / Homemaking Assistance Needed: pt can dress upper body but requires assist from wife to dress lower body. he needs assist  to get into/out of shower but can bathe self while sitting on chair with hand helpd showerhead. Communication / Swallowing Assistance Needed: HOH Comments: pt and wife report increased weakness over the last couple months        OT Problem List:        OT Treatment/Interventions:      OT Goals(Current goals can be found in the care plan section) Acute Rehab OT Goals Patient Stated Goal: to go home  OT Frequency:     Barriers to D/C:            Co-evaluation              AM-PAC OT "6 Clicks" Daily Activity     Outcome Measure Help from another person eating meals?: None Help from another person taking care of personal grooming?: None Help from another person toileting, which includes using toliet, bedpan, or urinal?: None Help from another person bathing (including washing, rinsing, drying)?: A Little Help from another person to put on and taking off regular upper body clothing?: None Help from another person to put on and taking off regular lower body clothing?: A Little 6 Click Score: 22   End of Session Equipment Utilized During Treatment: Gait belt;Rolling walker Nurse Communication:  (okay to see per rn)  Activity Tolerance: Patient tolerated treatment well Patient left: in chair;with call bell/phone within reach;with family/visitor present                   Time: 3790-2409 OT Time Calculation (min): 15 min Charges:  OT General Charges $OT Visit: 1 Visit OT Evaluation $OT Eval Low Complexity: 1 Low  Masaru Chamberlin, OTR/[place]  Office [phone] Pager: [phone]   Lenward Chancellor 10/06/2020, 12:30 PM

## 2020-10-06 NOTE — Progress Notes (Signed)
Subjective: No blood in stool. No abdominal pain.  Objective: Vital signs in last 24 hours: Temp:  [97.6 F (36.4 C)-98.4 F (36.9 C)] 97.6 F (36.4 C) (10/24 0640) Pulse Rate:  [24-71] 48 (10/24 0640) Resp:  [18-23] 20 (10/24 0640) BP: (115-159)/(23-61) 149/57 (10/24 0640) SpO2:  [92 %-100 %] 92 % (10/24 0640) Weight:  [109.6 kg] 109.6 kg (10/24 0500) Weight change:  Last BM Date: 10/04/20  PE: GEN:  NAD ABD:  Soft, non-tender SKIN:  Scattered ecchymoses  Lab Results: CBC    Component Value Date/Time   WBC 6.7 10/06/2020 0657   RBC 2.89 (L) 10/06/2020 0657   HGB 8.6 (L) 10/06/2020 0657   HGB 11.7 (L) 09/30/2020 0818   HCT 26.6 (L) 10/06/2020 0657   PLT 148 (L) 10/06/2020 0657   PLT 221 09/30/2020 0818   MCV 92.0 10/06/2020 0657   MCH 29.8 10/06/2020 0657   MCHC 32.3 10/06/2020 0657   RDW 14.9 10/06/2020 0657   LYMPHSABS 1.2 10/06/2020 0657   MONOABS 0.1 10/06/2020 0657   EOSABS 0.0 10/06/2020 0657   BASOSABS 0.1 10/06/2020 0657   CMP     Component Value Date/Time   NA 143 10/06/2020 0657   K 4.0 10/06/2020 0657   CL 109 10/06/2020 0657   CO2 26 10/06/2020 0657   GLUCOSE 60 (L) 10/06/2020 0657   BUN 29 (H) 10/06/2020 0657   CREATININE 1.14 10/06/2020 0657   CREATININE 1.38 (H) 09/30/2020 0818   CREATININE 1.30 (H) 04/30/2020 1044   CALCIUM 8.2 (L) 10/06/2020 0657   PROT 5.3 (L) 10/06/2020 0657   ALBUMIN 2.7 (L) 10/06/2020 0657   AST 25 10/06/2020 0657   AST 17 09/30/2020 0818   ALT 17 10/06/2020 0657   ALT 13 09/30/2020 0818   ALKPHOS 60 10/06/2020 0657   BILITOT 0.8 10/06/2020 0657   BILITOT 0.3 09/30/2020 0818   GFRNONAA >60 10/06/2020 0657   GFRNONAA 48 (L) 09/30/2020 0818   GFRNONAA 52 (L) 04/30/2020 1044   GFRAA >60 09/16/2020 0907   GFRAA 60 04/30/2020 1044   Assessment:  1.  GI bleeding, melena. 2.  Anemia, likely multifactorial and acute on chronic. 3.  Lung cancer, in midst of chemotherapy.  Plan:  1.  Transition to po PPI; would  do pantoprazole 40 mg po bid x 6 weeks, then 40 mg po qd indefinitely. 2.  Hold Eliquis for another one week, if possible, then ok to resume. 3.  Advance diet as tolerated. 4.  No further GI testing planned. 5.  Will arrange outpatient GI follow-up with Dr. Watt Climes (his primary gastroenterologist) in the next several weeks. 6.  [facility] will sign-off; please call with questions; thank you for the consultation.   Randall Wilkins 10/06/2020, 12:45 PM   Cell [phone] If no answer or after 5 PM call [phone]

## 2020-10-06 NOTE — Progress Notes (Signed)
PROGRESS NOTE    Randall Wilkins  KGM:[id number] DOB: [birth year] DOA: 10/02/2020 PCP: [name], MD   Brief Narrative:  HPI per Dr. Gala Romney on 10/02/20 Randall Wilkins is a 79 y.o. male with medical history significant of hypertension, diabetes, aortic stenosis, hypothyroidism, coronary artery disease status post PCI, chronic anticoagulation and stage IV lung cancer currently on chemo whose last chemo was 2 days ago that came to the ER with weakness shortness of breath as well as nausea.  Patient did get his chemo on Monday was feeling better at that time.  He went home but then started feeling weak and tired.  This morning he woke up with significant nausea.  He took some nausea medication and then suddenly became weak debilitated and not himself.  He was slightly confused so wife brought him to the ER where he was evaluated.  He was found to be significantly dehydrated.  Has acute on chronic kidney disease as well as elevated blood sugar.  Patient therefore being admitted to the hospital for evaluation.  ED Course: Temperature 98.6 blood pressure 130/54 pulse 90 respirate of 20 oxygen sat 90% on room air.  White count is 14 point hemoglobin 8.4 platelet count of 292.  Chemistry showed BUN of 82 creatinine 1.70 and calcium 8.5.  Glucose is 247.  Troponin is 55 and 52.  COVID-19 screen is negative.  Chest x-ray shows bilateral masses consistent with lung cancer.  Patient being admitted for further evaluation and treatment.  **Interim History  Patient states he is feeling a little bit better today but his renal function still elevated. We will continue him fluid hydration and obtain a PT and OT evaluation given his weakness.  10/04/20: Overnight patient had dark black tarry stools and his hemoglobin further dropped and is 6.6 today.  Initially thought that he had anemia due to his chemotherapy but now feels like he is actively having a GI bleed.  We will type and screen and transfuse 1  unit of PRBCs and place the patient on Protonix drip and hold his Eliquis and aspirin.  GI has been consulted and FOBT has been ordered.  GI planning on doing EGD tomorrow given the patient ate breakfast today and because there is a lack of anesthesia availability in the endoscopy unit today.  Patient is to be n.p.o. after midnight and will be placed on a clear liquid diet today with his Eliquis being held.  10/05/20: Did not have any more bleeding overnight per patient.  He is to undergo endoscopy later this afternoon and his sodium and chloride was elevated so we will start gentle IV fluid hydration with D5W to bring this down.  His blood count has adequately responded to the 2 units of transfusion and repeat this morning was 9.5/29.3.  Renal function is improved as well and will discuss with surgery about possibly getting a port while he is in-house.  10/06/20: EGD done and bleeding was thought to be from esophagitis. He is on a PPI and GI recommending transitioning to twice daily PPI and advancing diet. Recommendation is to hold Eliquis for least a week and then resume. Will consult interventional radiology for likely port placement and patient to be n.p.o. at midnight. Overnight he was hypoglycemic and this morning had to be given D50 amp and insulin has been adjusted and reduced.  Assessment & Plan:   Active Problems:   Diabetes 1.5, managed as type 1 ([place])   CAD S/P percutaneous coronary angioplasty  Aortic valve stenosis   Acute renal failure (HCC)   Chronic diastolic congestive heart failure (HCC)   Chronic kidney disease (CKD), stage III (moderate) (HCC)   Essential hypertension   Adenocarcinoma of right lung, stage 4 (HCC)   ARF (acute renal failure) (HCC)  Acute on chronic kidney disease stage IIIa , improving and is thought to be dehydration but now likely in setting of GI bleeding and diuretic use proved and stabilized -Patient has known history of chronic kidney disease stage  IIIa.  -Currently dehydrated as a result of chemo and diuretics. -Will need to Clarify Diuretics as he has Furosemide and Torsemide listed both on his MAR; see below -Wife is giving him some fluids but appears to be dehydrated. -Initially given aggressive IV fluid hydration with normal saline at 125 mL's per hour but will decrease rate to 75 MLS per hour for 1 day and this was stopped but will resume fluids at D5W given his hypernatremia and hyperchloremia -BUN/creatinine on 09/30/2020 was 15/1.38 and slightly worsened to 82/1.70; now slowly improving and went to 78/1.61 -> is 59/1.42 -> 39/1.17 and is now 29/1.14 -Patient was given IV fluid hydration with normal saline at 75 MLS per hour and this is now expired and stopped but we will resume D5W at 75 mils per hour for now given his hypernatremia and hyperchloremia but this is now stopped -Avoid nephrotoxic medications and will hold his furosemide/torsemide, lisinopril, allopurinol, Metformin, and tamsulosin -Avoid contrast dyes, and renally adjust medications -Continue to monitor and trend renal function panel and repeat CMP in a.m.  Dehydration, improved -Initially started on aggressive hydration and will hold his furosemide/torsemide; apparently the patient has been taking 80 mg of furosemide in the morning and 10 mg of torsemide in the evening without patient's PCP not.  He is only prescribed furosemide 80 mg p.o. 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 after midnight  Hypothyroidism -Resume levothyroxine 75 mcg p.o. daily before breakfast  History of gout -Currently holding allopurinol 300 mg p.o. daily given his worsened renal function  Chronic Diastolic CHF  -BNP on admission was 344.4 -Currently appears dehydrated and was given IV fluid resuscitation and have change the rate from 125 MLS per hour to 75 MLS per hour for 1 day and this is now been stopped and he is getting a unit of blood yesterday; fluids have been stopped but will resume today D5W -We will hold his furosemide and discontinue his torsemide -Continue with metoprolol succinate 25 mg p.o. daily and will hold his lisinopril 10  mg p.o. daily for now -Strict I's and O's and daily weights; is + 290,.9 mL since admission and weight is up 1 pound since admission -We will continue to monitor for signs and symptoms of volume overload and repeat a chest x-ray in a.m.  Insulin-Dependent Diabetes Mellitus Type 2 complicated by Hypoglycemia -Resume home Tresiba 46 units SQ nightly and hold his home Metformin and Trulicity as well as a sliding scale insulin; we will reduce the Lantus to 32 units -We will start on a moderate NovoLog/scale insulin before meals and at bedtime I reduce to sensitive NovoLog sign scale AC -Continue to monitor CBGs carefully is ranging from 30-242 -We will start D5W given his hypernatremia and hyperchloremia -As hemoglobin A1c was 6.4 on 05/20/2020 -We'll need to watch his blood sugars carefully given his hypoglycemia -Advance diet as tolerated  History of CAD status post CABG  x2 -We will hold his aspirin 81 mg daily due to his GI bleeding and will continue metoprolol succinate 25 mg p.o. daily, and will hold his lisinopril 10 mg daily -resume rosuvastatin 20 mg nightly -Currently denies any chest pain  History of left parietal CVA with small lacunar infarcts -Discontinued aspirin 81 mg p.o. daily as well as apixaban 5 milligrams p.o. twice daily for now given his GI bleeding but will continue simvastatin 20 once daily  Paroxysmal Atrial Fibrillation (New Onset Earlier this year) -CHA2DS2-VASc score of 6 -We will hold apixaban 5 mg p.o. twice daily given that he is having a GI bleed now despite having history of CVA -Continue telemetry Monitoring -Continue with metoprolol succinate 25 mg daily  Acquired Thrombophilia -In the setting of atrial fibrillation -CHA2DS2-VASc score is 6 -Continue to hold Eliquis and resume after a week per GI recommendations  Dyslipidemia/Hyperlipidemia -Continue Rosuvastatin 20 mg daily  BPH -Continue to hold his tamsulosin 0.4 mg p.o. daily  Anorexia -Patient has significant anorexia and nausea.   -Symptomatically treat. -We will consult nutritionist for further evaluation recommendation  Hypernatremia/Hyperchloremia -Patient sodium trended up to 147 his chloride level of 112 -Started D5W at 75 mils per hour for 12 hours and now stopped and sodium is now 143 and chloride is 109 -Continue monitor and repeat CMP in a.m.  Obesity -Complicates overall prognosis and care -Estimated body mass index is 37.84 kg/m as calculated from the following:   Height as of this encounter: 5\' 7"  (1.702 m).   Weight as of this encounter: 109.6 kg. -Weight Loss and Dietary Counseling given   OSA -C/w CPAP nightly   Normocytic Anemia/Anemia secondary to Antineoplastic therapy/acute blood loss anemia GI Bleeding in the setting of esophagitis -Patient's hemoglobin/hematocrit on admission was 8.4/26.0 and then trended down to 7.5/23.0  ->and -> 6.6/20.2 -Checked anemia panel and showed an iron level of 192, TIBC of 17, TIBC of 209, saturation ratios of 92%, ferritin level 14, folate levels 22.3, and vitamin B12 of 3201 -Initially thought to be from antineoplastic therapy and dilutional drop however patient was having significant melanotic and dark tarry stools overnight -Because his acute drop in hemoglobin to 6.0 we will type and screen and transfuse 2 unit PRBCs -We will hold his Eliquis and his aspirin; GI recommending holding Eliquis for another week if possible and then resuming -We will start 2 large-bore IVs and place the patient on Protonix drip and now changed to  -We will obtain FOBT; this is pending to be done -Hemoglobin/hematocrit after blood transfusions trended up to 8.6/26.2 yesterday morning and then repeat this morning  was also eight 9.5/29.3 -> 9.4/28.8 -> 8.6/26.6 -GI consulted for further evaluation recommendations and because the patient ate today and because there is no availability endoscopy unit today his EGD done yesterday afternoon and showed "LA Grade A esophagitis. Erosive gastropathy with no stigmata of recent bleeding. Bulbar duodenitis. Acquired duodenal stenosis. Likely peptic stricture from prior episode of duodenal ulcer disease noted on EGD in 2016. Overall bleeding most likely from esophagitis." -Continue to monitor for signs and symptoms of bleeding; no longer having the melanotic and dark tarry stools as he states this is improved -Diet is being advanced and patient will need to follow-up with Dr. Watt Climes in the outpatient setting -Repeat CBC in a.m.  Esophagitis -On PPI; Please See GI recc's  Leukocytosis, improved -Likely hemoconcentration in the setting of dehydration  -WBC on admission was 14.2 ->10.1 -> 6.0 -> 7.0 -> 6.7 -Continue to monitor for signs and symptoms of infection next-repeat CBC in a.m  Generalized Weakness -In the setting of dehydration and acute blood loss  anemia -PT/OT to Evaluate and Treat and recommending no Follow up   DVT prophylaxis: Anticoagulated with Apixaban Code Status: FULL CODE  Family Communication: Discussed with the wife at bedside Disposition Plan: Pending further clinical improvement and back to baseline and evaluation by PT OT and advancement of Diet; Will be getting a Port in the AM  Status is: Inpatient  Remains inpatient appropriate because:Unsafe d/c plan, IV treatments appropriate due to intensity of illness or inability to take PO and Inpatient level of care appropriate due to severity of illness   Dispo: The patient is from: Home              Anticipated d/c is to: TBD              Anticipated d/c date is:1-2 days              Patient currently is not medically stable to d/c.  Consultants:  Gastroenterology Notified Oncology via epic IR for port Placement    Procedures:   EGD  Findings:      A small hiatal hernia was present.      One benign-appearing, intrinsic mild stenosis was found. The stenosis       was traversed.      LA Grade A (one or more mucosal breaks less than 5 mm, not extending       between tops of 2 mucosal folds) esophagitis was found.      The exam of the esophagus was otherwise normal.      A few dispersed small erosions with no stigmata of recent bleeding were       found in the gastric body and in the gastric antrum.      The exam of the stomach was otherwise normal.      Mild inflammation was found in the duodenal bulb.      An acquired moderate stenosis was found in the second portion of the       duodenum and was non-traversed.      No old or fresh blood was seen to the extent of our examination. Impression:               - Small hiatal hernia.                           - Benign-appearing esophageal stenosis.                           -  LA Grade A esophagitis.                           - Erosive gastropathy with no stigmata of recent                            bleeding.                            - Bulbar duodenitis.                           - Acquired duodenal stenosis. Likely peptic                            stricture from prior episode of duodenal ulcer                            disease noted on EGD in 2016.                           - Overall bleeding most likely from esophagitis. Moderate Sedation:      None Recommendation:           - Return patient to hospital ward for ongoing care.                           - Clear liquid diet today.                           - Continue present medications, including PPI.  Antimicrobials:  Anti-infectives (From admission, onward)   None       Subjective: Seen and examined at bedside and is doing better today and feels stronger but denies any nausea or vomiting.  Not having any more bloody bowel movements and states last bowel movement he had had no blood in it.  Denies any chest pain.  Left pinky toe is bleeding this morning.  GI recommending advancing diet.  We will consult IR for port placement.  Objective: Vitals:   10/05/20 2020 10/06/20 0500 10/06/20 0640 10/06/20 1401  BP: 129/61  (!) 149/57 (!) 156/61  Pulse: 71  (!) 48 71  Resp: 18  20 18   Temp: 98.4 F (36.9 C)  97.6 F (36.4 C) 97.8 F (36.6 C)  TempSrc: Oral  Oral Oral  SpO2: 95%  92% 99%  Weight:  109.6 kg    Height:        Intake/Output Summary (Last 24 hours) at 10/06/2020 1440 Last data filed at 10/06/2020 1000 Gross per 24 hour  Intake 1940 ml  Output 2000 ml  Net -60 ml   Filed Weights   10/02/20 1640 10/06/20 0500  Weight: 108.9 kg 109.6 kg   Examination: Physical Exam:  Constitutional: WN/WD obese Caucasian male currently in sitting in the chair bedside in no acute distress and feels well.   Eyes: Lids and conjunctivae normal, sclerae anicteric  ENMT: External Ears, Nose appear normal. Grossly normal hearing. Neck: Appears normal, supple, no cervical masses, normal ROM, no appreciable thyromegaly; no JVD Respiratory:  Diminished to auscultation bilaterally, no wheezing, rales, rhonchi or crackles. Normal respiratory effort and patient is  not tachypenic. No accessory muscle use.  Unlabored breathing Cardiovascular: RRR, no murmurs / rubs / gallops. S1 and S2 auscultated.  Has some mild lower extremity edema Abdomen: Soft, non-tender, distended secondary body habitus. Bowel sounds positive.  GU: Deferred. Musculoskeletal: No clubbing / cyanosis of digits/nails. No joint deformity upper and lower extremities.  Skin: Has lower extremity skin changes and discoloration from chronic venous stasis with hemosiderin deposition. No induration; Warm and dry.  Left pinky toes bleeding Neurologic: CN 2-12 grossly intact with no focal deficits. Romberg sign and cerebellar reflexes not assessed.  Psychiatric: Normal judgment and insight. Alert and oriented x 3. Normal mood and appropriate affect.   Data Reviewed: I have personally reviewed following labs and imaging studies  CBC: Recent Labs  Lab 10/02/20 1747 10/02/20 1747 10/03/20 0809 10/03/20 0809 10/04/20 0604 10/04/20 0604 10/04/20 1403 10/05/20 0309 10/05/20 0846 10/05/20 1415 10/06/20 0657  WBC 14.2*  --  10.1  --  6.0  --   --  7.0  --   --  6.7  NEUTROABS 11.0*  --  8.0*  --  5.0  --   --  5.7  --   --  5.4  HGB 8.4*   < > 7.5*   < > 6.6*   < > 6.0* 8.6* 9.5* 9.4* 8.6*  HCT 26.0*   < > 23.0*   < > 20.2*   < > 18.4* 26.2* 29.3* 28.8* 26.6*  MCV 95.2  --  93.5  --  92.2  --   --  91.9  --   --  92.0  PLT 292  --  233  --  197  --   --  168  --   --  148*   < > = values in this interval not displayed.   Basic Metabolic Panel: Recent Labs  Lab 10/02/20 1747 10/03/20 0809 10/04/20 0604 10/05/20 0309 10/06/20 0657  NA 137 140 140 147* 143  K 4.9 4.2 4.1 4.2 4.0  CL 100 105 108 112* 109  CO2 26 23 25 28 26   GLUCOSE 247* 163* 174* 89 60*  BUN 82* 78* 59* 39* 29*  CREATININE 1.70* 1.61* 1.42* 1.17 1.14  CALCIUM 8.5* 8.1* 8.1* 8.4* 8.2*  MG  --   2.1 2.1 2.0 1.9  PHOS  --  4.4 3.9 3.9 3.7   GFR: Estimated Creatinine Clearance: 62.1 mL/min (by C-G formula based on SCr of 1.14 mg/dL). Liver Function Tests: Recent Labs  Lab 10/02/20 1747 10/03/20 0809 10/04/20 0604 10/05/20 0309 10/06/20 0657  AST 29 30 25 22 25   ALT 20 19 21 17 17   ALKPHOS 60 54 53 56 60  BILITOT 0.4 0.4 0.5 0.8 0.8  PROT 5.2* 5.2* 5.0* 5.0* 5.3*  ALBUMIN 2.6* 2.6* 2.5* 2.5* 2.7*   Recent Labs  Lab 10/02/20 1747  LIPASE 28   No results for input(s): AMMONIA in the last 168 hours. Coagulation Profile: No results for input(s): INR, PROTIME in the last 168 hours. Cardiac Enzymes: No results for input(s): CKTOTAL, CKMB, CKMBINDEX, TROPONINI in the last 168 hours. BNP (last 3 results) No results for input(s): PROBNP in the last 8760 hours. HbA1C: No results for input(s): HGBA1C in the last 72 hours. CBG: Recent Labs  Lab 10/06/20 0742 10/06/20 0822 10/06/20 0824 10/06/20 0924 10/06/20 1151  GLUCAP 38* 38* 49* 140* 134*   Lipid Profile: No results for input(s): CHOL, HDL, LDLCALC, TRIG, CHOLHDL, LDLDIRECT in the last 72 hours. Thyroid Function Tests: No  results for input(s): TSH, T4TOTAL, FREET4, T3FREE, THYROIDAB in the last 72 hours. Anemia Panel: Recent Labs    10/04/20 0604  VITAMINB12 3,201*  FOLATE 22.3  FERRITIN 410*  TIBC 209*  IRON 192*  RETICCTPCT 1.4   Sepsis Labs: No results for input(s): PROCALCITON, LATICACIDVEN in the last 168 hours.  Recent Results (from the past 240 hour(s))  Respiratory Panel by RT PCR (Flu A&B, Covid) - Nasopharyngeal Swab     Status: None   Collection Time: 10/02/20  5:57 PM   Specimen: Nasopharyngeal Swab  Result Value Ref Range Status   SARS Coronavirus 2 by RT PCR NEGATIVE NEGATIVE Final    Comment: (NOTE) SARS-CoV-2 target nucleic acids are NOT DETECTED.  The SARS-CoV-2 RNA is generally detectable in upper respiratoy specimens during the acute phase of infection. The lowest concentration  of SARS-CoV-2 viral copies this assay can detect is 131 copies/mL. A negative result does not preclude SARS-Cov-2 infection and should not be used as the sole basis for treatment or other patient management decisions. A negative result may occur with  improper specimen collection/handling, submission of specimen other than nasopharyngeal swab, presence of viral mutation(s) within the areas targeted by this assay, and inadequate number of viral copies (<131 copies/mL). A negative result must be combined with clinical observations, patient history, and epidemiological information. The expected result is Negative.  Fact Sheet for Patients:  PinkCheek.be  Fact Sheet for Healthcare Providers:  GravelBags.it  This test is no t yet approved or cleared by the [place] FDA and  has been authorized for detection and/or diagnosis of SARS-CoV-2 by FDA under an Emergency Use Authorization (EUA). This EUA will remain  in effect (meaning this test can be used) for the duration of the COVID-19 declaration under Section 564(b)(1) of the Act, 21 U.S.C. section 360bbb-3(b)(1), unless the authorization is terminated or revoked sooner.     Influenza A by PCR NEGATIVE NEGATIVE Final   Influenza B by PCR NEGATIVE NEGATIVE Final    Comment: (NOTE) The Xpert Xpress SARS-CoV-2/FLU/RSV assay is intended as an aid in  the diagnosis of influenza from Nasopharyngeal swab specimens and  should not be used as a sole basis for treatment. Nasal washings and  aspirates are unacceptable for Xpert Xpress SARS-CoV-2/FLU/RSV  testing.  Fact Sheet for Patients: PinkCheek.be  Fact Sheet for Healthcare Providers: GravelBags.it  This test is not yet approved or cleared by the [place] FDA and  has been authorized for detection and/or diagnosis of SARS-CoV-2 by  FDA under an Emergency Use  Authorization (EUA). This EUA will remain  in effect (meaning this test can be used) for the duration of the  Covid-19 declaration under Section 564(b)(1) of the Act, 21  U.S.C. section 360bbb-3(b)(1), unless the authorization is  terminated or revoked. Performed at [facility], [place] [address] [place], [place] [address]     RN Pressure Injury Documentation:     Estimated body mass index is 37.84 kg/m as calculated from the following:   Height as of this encounter: 5\' 7"  (1.702 m).   Weight as of this encounter: 109.6 kg.  Malnutrition Type:  Nutrition Problem: Increased nutrient needs Etiology: cancer and cancer related treatments   Malnutrition Characteristics:  Signs/Symptoms: estimated needs   Nutrition Interventions:  Interventions: Glucerna shake Radiology Studies: No results found. Scheduled Meds: . sodium chloride   Intravenous Once  . dextrose      . donepezil  10 mg Oral QHS  . feeding supplement (  GLUCERNA SHAKE)  237 mL Oral TID BM  . folic acid  1 mg Oral Daily  . insulin aspart  0-9 Units Subcutaneous TID WC  . insulin glargine  32 Units Subcutaneous QHS  . levothyroxine  75 mcg Oral QAC breakfast  . metoprolol succinate  25 mg Oral Daily  . ondansetron (ZOFRAN) IV  4 mg Intravenous Once  . pantoprazole  40 mg Oral BID AC  . rosuvastatin  20 mg Oral Daily  . vitamin B-12  1,000 mcg Oral Daily   Continuous Infusions:   LOS: 4 days   Kerney Elbe, DO [facility] PAGER is on AMION  If 7PM-7AM, please contact night-coverage [email or web address]

## 2020-10-06 NOTE — Progress Notes (Signed)
Physical Therapy Treatment Patient Details Name: Randall Wilkins MRN: [id number] DOB: 04-26-1941 Today's Date: 10/06/2020    History of Present Illness Patient is 79 y.o. 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 Pt presented to [facility] via EMS after feelign weak, nauseaous at home. Pt's wife reports she helped him slowly lower to the ground on his knees and called for EMS as he briefly lost consciousness. Pt admitted for AKI. s/p EGD.    PT Comments    Pt continues to participate well. He did c/o dyspnea with ambulation-O2 >90% on RA   Follow Up Recommendations  Home health PT     Equipment Recommendations  None recommended by PT    Recommendations for Other Services       Precautions / Restrictions Precautions Precautions: Fall Restrictions Weight Bearing Restrictions: No    Mobility  Bed Mobility               General bed mobility comments: Up in recliner.  Transfers Overall transfer level: Needs assistance   Transfers: Sit to/from Stand Sit to Stand: Supervision         General transfer comment: Increased time. Cues for safety.  Ambulation/Gait Ambulation/Gait assistance: Min guard Gait Distance (Feet): 60 Feet Assistive device: Rolling walker (2 wheeled) Gait Pattern/deviations: Step-through pattern;Decreased stride length     General Gait Details: AFO worn for ambulation. Pt denied lightheadedness but he did report dyspnea. O2 >90% on RA, dyspnea 2/4. Min guard for safety.   Stairs             Wheelchair Mobility    Modified Rankin (Stroke Patients Only)       Balance Overall balance assessment: Needs assistance         Standing balance support: Bilateral upper extremity supported Standing balance-Leahy Scale: Poor                              Cognition Arousal/Alertness: Awake/alert Behavior During Therapy: WFL for tasks  assessed/performed Overall Cognitive Status: Within Functional Limits for tasks assessed                                        Exercises      General Comments        Pertinent Vitals/Pain Pain Assessment: No/denies pain    Home Living                      Prior Function            PT Goals (current goals can now be found in the care plan section) Progress towards PT goals: Progressing toward goals    Frequency    Min 3X/week      PT Plan Current plan remains appropriate    Co-evaluation              AM-PAC PT "6 Clicks" Mobility   Outcome Measure  Help needed turning from your back to your side while in a flat bed without using bedrails?: A Little Help needed moving from lying on your back to sitting on the side of a flat bed without using bedrails?: A Little Help needed moving to and from a bed to a chair (including a wheelchair)?: A Little Help needed standing up from a  chair using your arms (e.g., wheelchair or bedside chair)?: A Little Help needed to walk in hospital room?: A Little Help needed climbing 3-5 steps with a railing? : A Little 6 Click Score: 18    End of Session Equipment Utilized During Treatment: Gait belt Activity Tolerance: Patient tolerated treatment well Patient left: in chair;with bed alarm set;with family/visitor present   PT Visit Diagnosis: Muscle weakness (generalized) (M62.81);Other abnormalities of gait and mobility (R26.89);Unsteadiness on feet (R26.81);Difficulty in walking, not elsewhere classified (R26.2)     Time: 4540-9811 PT Time Calculation (min) (ACUTE ONLY): 17 min  Charges:  $Gait Training: 8-22 mins                        Doreatha Massed, PT Acute Rehabilitation  Office: [phone] Pager: [phone]

## 2020-10-06 NOTE — Progress Notes (Signed)
Patient CBG was 38. 8 oz of juice was given. Repeat CBG was 49. MD made aware. Patient is AO and sitting in bed.

## 2020-10-07 ENCOUNTER — Inpatient Hospital Stay (HOSPITAL_COMMUNITY): Payer: Medicare Other

## 2020-10-07 ENCOUNTER — Encounter (HOSPITAL_COMMUNITY): Payer: Self-pay | Admitting: Gastroenterology

## 2020-10-07 ENCOUNTER — Inpatient Hospital Stay: Payer: Medicare Other

## 2020-10-07 DIAGNOSIS — C3491 Malignant neoplasm of unspecified part of right bronchus or lung: Secondary | ICD-10-CM | POA: Diagnosis not present

## 2020-10-07 DIAGNOSIS — I2510 Atherosclerotic heart disease of native coronary artery without angina pectoris: Secondary | ICD-10-CM | POA: Diagnosis not present

## 2020-10-07 DIAGNOSIS — I5032 Chronic diastolic (congestive) heart failure: Secondary | ICD-10-CM | POA: Diagnosis not present

## 2020-10-07 DIAGNOSIS — I350 Nonrheumatic aortic (valve) stenosis: Secondary | ICD-10-CM | POA: Diagnosis not present

## 2020-10-07 HISTORY — PX: IR IMAGING GUIDED PORT INSERTION: IMG5740

## 2020-10-07 LAB — GLUCOSE, CAPILLARY
Glucose-Capillary: 95 mg/dL (ref 70–99)
Glucose-Capillary: 68 mg/dL — ABNORMAL LOW (ref 70–99)
Glucose-Capillary: 67 mg/dL — ABNORMAL LOW (ref 70–99)
Glucose-Capillary: 101 mg/dL — ABNORMAL HIGH (ref 70–99)
Glucose-Capillary: 101 mg/dL — ABNORMAL HIGH (ref 70–99)
Glucose-Capillary: 110 mg/dL — ABNORMAL HIGH (ref 70–99)

## 2020-10-07 LAB — PHOSPHORUS: Phosphorus: 4 mg/dL (ref 2.5–4.6)

## 2020-10-07 LAB — HEMOGLOBIN A1C
Hgb A1c MFr Bld: 6.7 % — ABNORMAL HIGH (ref 4.8–5.6)
Mean Plasma Glucose: 146 mg/dL

## 2020-10-07 LAB — CBC WITH DIFFERENTIAL/PLATELET
Abs Immature Granulocytes: 0.01 10*3/uL (ref 0.00–0.07)
Basophils Absolute: 0 10*3/uL (ref 0.0–0.1)
Basophils Relative: 1 %
Eosinophils Absolute: 0 10*3/uL (ref 0.0–0.5)
Eosinophils Relative: 0 %
HCT: 26.5 % — ABNORMAL LOW (ref 39.0–52.0)
Hemoglobin: 8.5 g/dL — ABNORMAL LOW (ref 13.0–17.0)
Immature Granulocytes: 0 %
Lymphocytes Relative: 17 %
Lymphs Abs: 0.8 10*3/uL (ref 0.7–4.0)
MCH: 29.7 pg (ref 26.0–34.0)
MCHC: 32.1 g/dL (ref 30.0–36.0)
MCV: 92.7 fL (ref 80.0–100.0)
Monocytes Absolute: 0.1 10*3/uL (ref 0.1–1.0)
Monocytes Relative: 2 %
Neutro Abs: 3.8 10*3/uL (ref 1.7–7.7)
Neutrophils Relative %: 80 %
Platelets: 128 10*3/uL — ABNORMAL LOW (ref 150–400)
RBC: 2.86 MIL/uL — ABNORMAL LOW (ref 4.22–5.81)
RDW: 14.7 % (ref 11.5–15.5)
WBC: 4.7 10*3/uL (ref 4.0–10.5)
nRBC: 0 % (ref 0.0–0.2)

## 2020-10-07 LAB — COMPREHENSIVE METABOLIC PANEL
ALT: 20 U/L (ref 0–44)
AST: 24 U/L (ref 15–41)
Albumin: 2.8 g/dL — ABNORMAL LOW (ref 3.5–5.0)
Alkaline Phosphatase: 68 U/L (ref 38–126)
Anion gap: 8 (ref 5–15)
BUN: 28 mg/dL — ABNORMAL HIGH (ref 8–23)
CO2: 25 mmol/L (ref 22–32)
Calcium: 8.5 mg/dL — ABNORMAL LOW (ref 8.9–10.3)
Chloride: 110 mmol/L (ref 98–111)
Creatinine, Ser: 1.16 mg/dL (ref 0.61–1.24)
GFR, Estimated: >60 mL/min (ref >60)
Glucose, Bld: 101 mg/dL — ABNORMAL HIGH (ref 70–99)
Potassium: 4.9 mmol/L (ref 3.5–5.1)
Sodium: 143 mmol/L (ref 135–145)
Total Bilirubin: 0.8 mg/dL (ref 0.3–1.2)
Total Protein: 5.6 g/dL — ABNORMAL LOW (ref 6.5–8.1)

## 2020-10-07 LAB — PROTIME-INR
INR: 1.1 (ref 0.8–1.2)
Prothrombin Time: 13.7 seconds (ref 11.4–15.2)

## 2020-10-07 LAB — MAGNESIUM: Magnesium: 1.8 mg/dL (ref 1.7–2.4)

## 2020-10-07 MED ORDER — MIDAZOLAM HCL 2 MG/2ML IJ SOLN
INTRAMUSCULAR | Status: AC
  Filled 2020-10-07: qty 2

## 2020-10-07 MED ORDER — MAGNESIUM SULFATE 2 GM/50ML IV SOLN
2.0000 g | Freq: Once | INTRAVENOUS | Status: AC
  Administered 2020-10-07: 2 g via INTRAVENOUS
  Filled 2020-10-07: qty 50

## 2020-10-07 MED ORDER — HEPARIN SOD (PORK) LOCK FLUSH 100 UNIT/ML IV SOLN
INTRAVENOUS | Status: AC
  Filled 2020-10-07: qty 5

## 2020-10-07 MED ORDER — TAMSULOSIN HCL 0.4 MG PO CAPS
0.4000 mg | ORAL_CAPSULE | Freq: Every day | ORAL | Status: DC
  Administered 2020-10-07 – 2020-10-08 (×2): 0.4 mg via ORAL
  Filled 2020-10-07 (×2): qty 1

## 2020-10-07 MED ORDER — DEXTROSE 50 % IV SOLN
12.5000 g | INTRAVENOUS | Status: AC
  Administered 2020-10-07: 12.5 g via INTRAVENOUS
  Filled 2020-10-07: qty 50

## 2020-10-07 MED ORDER — LIDOCAINE-EPINEPHRINE 1 %-1:100000 IJ SOLN
INTRAMUSCULAR | Status: AC
  Filled 2020-10-07: qty 1

## 2020-10-07 MED ORDER — CEFAZOLIN SODIUM-DEXTROSE 2-4 GM/100ML-% IV SOLN
2.0000 g | INTRAVENOUS | Status: AC
  Filled 2020-10-07: qty 100

## 2020-10-07 MED ORDER — CHLORHEXIDINE GLUCONATE CLOTH 2 % EX PADS
6.0000 | MEDICATED_PAD | Freq: Every day | CUTANEOUS | Status: DC
  Administered 2020-10-07: 6 via TOPICAL

## 2020-10-07 MED ORDER — FENTANYL CITRATE (PF) 100 MCG/2ML IJ SOLN
INTRAMUSCULAR | Status: AC | PRN
  Administered 2020-10-07: 50 ug via INTRAVENOUS

## 2020-10-07 MED ORDER — MIDAZOLAM HCL 2 MG/2ML IJ SOLN
INTRAMUSCULAR | Status: AC | PRN
  Administered 2020-10-07: 1 mg via INTRAVENOUS

## 2020-10-07 MED ORDER — CEFAZOLIN SODIUM-DEXTROSE 2-4 GM/100ML-% IV SOLN
INTRAVENOUS | Status: AC
  Administered 2020-10-07: 2 g via INTRAVENOUS
  Filled 2020-10-07: qty 100

## 2020-10-07 MED ORDER — LIDOCAINE-EPINEPHRINE 1 %-1:100000 IJ SOLN
INTRAMUSCULAR | Status: AC | PRN
  Administered 2020-10-07 (×2): 10 mL via INTRADERMAL

## 2020-10-07 MED ORDER — GLUCAGON HCL RDNA (DIAGNOSTIC) 1 MG IJ SOLR
INTRAMUSCULAR | Status: AC
  Filled 2020-10-07: qty 1

## 2020-10-07 MED ORDER — INSULIN GLARGINE 100 UNIT/ML ~~LOC~~ SOLN
28.0000 [IU] | Freq: Every day | SUBCUTANEOUS | Status: DC
  Administered 2020-10-07: 28 [IU] via SUBCUTANEOUS
  Filled 2020-10-07: qty 0.28

## 2020-10-07 MED ORDER — FENTANYL CITRATE (PF) 100 MCG/2ML IJ SOLN
INTRAMUSCULAR | Status: AC
  Filled 2020-10-07: qty 2

## 2020-10-07 NOTE — Care Management Important Message (Signed)
Important Message  Patient Details IM Letter given to the Patient Name: Randall Wilkins MRN: [id number] Date of Birth: August 28, 1941   Medicare Important Message Given:  Yes     Kerin Salen 10/07/2020, 1:38 PM

## 2020-10-07 NOTE — Progress Notes (Signed)
PROGRESS NOTE    Randall Wilkins  YNW:[id number] DOB: October 05, 1941 DOA: 10/02/2020 PCP: [name], MD   Brief Narrative:  HPI per Dr. Gala Romney on 10/02/20 Randall Wilkins is a 79 y.o. male with medical history significant of hypertension, diabetes, aortic stenosis, hypothyroidism, coronary artery disease status post PCI, chronic anticoagulation and stage IV lung cancer currently on chemo whose last chemo was 2 days ago that came to the ER with weakness shortness of breath as well as nausea.  Patient did get his chemo on Monday was feeling better at that time.  He went home but then started feeling weak and tired.  This morning he woke up with significant nausea.  He took some nausea medication and then suddenly became weak debilitated and not himself.  He was slightly confused so wife brought him to the ER where he was evaluated.  He was found to be significantly dehydrated.  Has acute on chronic kidney disease as well as elevated blood sugar.  Patient therefore being admitted to the hospital for evaluation.  ED Course: Temperature 98.6 blood pressure 130/54 pulse 90 respirate of 20 oxygen sat 90% on room air.  White count is 14 point hemoglobin 8.4 platelet count of 292.  Chemistry showed BUN of 82 creatinine 1.70 and calcium 8.5.  Glucose is 247.  Troponin is 55 and 52.  COVID-19 screen is negative.  Chest x-ray shows bilateral masses consistent with lung cancer.  Patient being admitted for further evaluation and treatment.  **Interim History  Patient states he is feeling a little bit better today but his renal function still elevated. We will continue him fluid hydration and obtain a PT and OT evaluation given his weakness.  10/04/20: Overnight patient had dark black tarry stools and his hemoglobin further dropped and is 6.6 today.  Initially thought that he had anemia due to his chemotherapy but now feels like he is actively having a GI bleed.  We will type and screen and transfuse 1  unit of PRBCs and place the patient on Protonix drip and hold his Eliquis and aspirin.  GI has been consulted and FOBT has been ordered.  GI planning on doing EGD tomorrow given the patient ate breakfast today and because there is a lack of anesthesia availability in the endoscopy unit today.  Patient is to be n.p.o. after midnight and will be placed on a clear liquid diet today with his Eliquis being held.  10/05/20: Did not have any more bleeding overnight per patient.  He is to undergo endoscopy later this afternoon and his sodium and chloride was elevated so we will start gentle IV fluid hydration with D5W to bring this down.  His blood count has adequately responded to the 2 units of transfusion and repeat this morning was 9.5/29.3.  Renal function is improved as well and will discuss with surgery about possibly getting a port while he is in-house.  10/06/20: EGD done and bleeding was thought to be from esophagitis. He is on a PPI and GI recommending transitioning to twice daily PPI and advancing diet. Recommendation is to hold Eliquis for least a week and then resume. Will consult interventional radiology for likely port placement and patient to be n.p.o. at midnight. Overnight he was hypoglycemic and this morning had to be given D50 amp and insulin has been adjusted and reduced.  10/07/2020: Patient is to have a port placement today and PT recommending home health PT.  Assessment & Plan:   Active Problems:  Diabetes 1.5, managed as type 1 ([place])   CAD S/P percutaneous coronary angioplasty   Aortic valve stenosis   Acute renal failure (HCC)   Chronic diastolic congestive heart failure (HCC)   Chronic kidney disease (CKD), stage III (moderate) (HCC)   Essential hypertension   Adenocarcinoma of right lung, stage 4 (HCC)   ARF (acute renal failure) (HCC)  Acute on chronic kidney disease stage IIIa , improving and is thought to be dehydration but now likely in setting of GI bleeding and  diuretic use, improved and stabilized -Patient has known history of chronic kidney disease stage IIIa.  -Currently dehydrated as a result of chemo and diuretics. -Will need to Clarify Diuretics as he has Furosemide and Torsemide listed both on his MAR; see below -Appears Euvolemic now  -BUN/creatinine on 09/30/2020 was 15/1.38 and slightly worsened to 82/1.70; now slowly improving and went to 78/1.61 -> is 59/1.42 -> 39/1.17 and is now 29/1.14 yesterday and is now 28/1.16 -IVF now stopped  -Avoid nephrotoxic medications and currently holding his furosemide/torsemide, lisinopril, allopurinol, Metformin, and tamsulosin; Will resume Tamsulosin today, Lisinopril in the AM and Furosemide likely at D/C -Avoid contrast dyes, and renally adjust medications -Continue to monitor and trend renal function panel and repeat CMP in a.m.  Dehydration, improved -Initially started on aggressive hydration and will hold his furosemide/torsemide; apparently the patient has been taking 80 mg of furosemide in the morning and 10 mg of torsemide in the evening without patient's PCP not.  He is only prescribed furosemide 80 mg p.o. 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 currently awaiting it to get done  Hypothyroidism -Resume levothyroxine 75 mcg p.o. daily before breakfast  History of  gout -Currently holding allopurinol 300 mg p.o. daily given his worsened renal function  Chronic Diastolic CHF  -BNP on admission was 344.4 -Currently appears dehydrated and was given IV fluid resuscitation and have change the rate from 125 MLS per hour to 75 MLS per hour for 1 day and this is now been stopped and he is getting a unit of blood yesterday; fluids have been stopped but will resume today D5W -We will hold his furosemide and discontinue his torsemide altogether; Resume Furosemide in the AM likely at D/C  -Continue with metoprolol succinate 25 mg p.o. daily and will hold his lisinopril 10 mg p.o. daily for now -Strict I's and O's and daily weights; is + 85.9 mL since admission and weight is up 1 pound since admission -We will continue to monitor for signs and symptoms of volume overload  Insulin-Dependent Diabetes Mellitus Type 2 complicated by Hypoglycemia -Resume home Tresiba 46 units SQ nightly and hold his home Metformin and Trulicity as well as a sliding scale insulin; we will reduce the Lantus to 32 units -We will start on a moderate NovoLog/scale insulin before  meals and at bedtime I reduce to sensitive NovoLog sign scale AC -Continue to monitor CBGs carefully is ranging from 95-156 -We will start D5W given his hypernatremia and hyperchloremia -As hemoglobin A1c was 6.4 on 05/20/2020 -We'll need to watch his blood sugars carefully given his hypoglycemia -Advance diet as tolerated  History of CAD status post CABG x2 -We will hold his aspirin 81 mg daily due to his GI bleeding and will continue metoprolol succinate 25 mg p.o. daily, and will hold his lisinopril 10 mg daily and resume in the AM  -resume rosuvastatin 20 mg nightly -Currently denies any chest pain  History of left parietal CVA with small lacunar infarcts -Discontinued aspirin 81 mg p.o. daily as well as apixaban 5 milligrams p.o. twice daily for now given his GI bleeding but will continue simvastatin 20 once  daily; Resume Apxiaban 5 mg BID  Paroxysmal Atrial Fibrillation (New Onset Earlier this year) -CHA2DS2-VASc score of 6 -We will hold apixaban 5 mg p.o. twice daily given that he is having a GI bleed now despite having history of CVA -Continue telemetry Monitoring -Continue with metoprolol succinate 25 mg daily  Acquired Thrombophilia -In the setting of atrial fibrillation -CHA2DS2-VASc score is 6 -Continue to hold Eliquis and resume after a week per GI recommendations  Dyslipidemia/Hyperlipidemia -Continue Rosuvastatin 20 mg daily  BPH -Will resume Tamsulosin 0.4 mg p.o. daily  Anorexia -Patient has significant anorexia and nausea.   -Symptomatically treat. -We will consult nutritionist for further evaluation recommendation  Hypernatremia/Hyperchloremia -Patient sodium trended up to 147 his chloride level of 112; Now Na+ is 143 and Chloride is 110 -IVF with D5W Stopped -Continue monitor and repeat CMP in a.m.  Obesity -Complicates overall prognosis and care -Estimated body mass index is 37.84 kg/m as calculated from the following:   Height as of this encounter: 5\' 7"  (1.702 m).   Weight as of this encounter: 109.6 kg. -Weight Loss and Dietary Counseling given   OSA -C/w CPAP nightly   Normocytic Anemia/Anemia secondary to Antineoplastic therapy/acute blood loss anemia GI Bleeding in the setting of esophagitis -Patient's hemoglobin/hematocrit on admission was 8.4/26.0 and then trended down to 7.5/23.0 ->and -> 6.6/20.2 -Checked anemia panel and showed an iron level of 192, TIBC of 17, TIBC of 209, saturation ratios of 92%, ferritin level 14, folate levels 22.3, and vitamin B12 of 3201 -Initially thought to be from antineoplastic therapy and dilutional drop however patient was having significant melanotic and dark tarry stools overnight -Because his acute drop in hemoglobin to 6.0 we will type and screen and transfuse 2 unit PRBCs -We will hold his Eliquis and his  aspirin; GI recommending holding Eliquis for another week if possible and then resuming -We will start 2 large-bore IVs -BID PPI with Pantorprazole 40 mg po x6 weeks and then po 40 mg Daily indefinitely per GI recc's -We will obtain FOBT; this is pending to be done -Hemoglobin/hematocrit after blood transfusions trended up to 8.6/26.2 yesterday morning and then repeat this morning was also eight 9.5/29.3 -> 9.4/28.8 -> 8.6/26.6 -> and is stable at 8.5/26.5 -GI consulted for further evaluation recommendations and because the patient ate today and because there is no availability endoscopy unit today his EGD done yesterday afternoon and showed "LA Grade A esophagitis. Erosive gastropathy with no stigmata of recent bleeding. Bulbar duodenitis. Acquired duodenal stenosis. Likely peptic stricture from prior episode of duodenal ulcer disease noted on EGD in 2016. Overall bleeding most likely from esophagitis." -Continue to monitor for signs and  symptoms of bleeding; no longer having the melanotic and dark tarry stools as he states this is improved -Diet is being advanced and patient will need to follow-up with Dr. Watt Climes in the outpatient setting -Repeat CBC in a.m.  Esophagitis -On PPI; Please See GI recc's and will need to be on it BID  Leukocytosis, improved -Likely hemoconcentration in the setting of dehydration  -WBC on admission was 14.2 ->10.1 -> 6.0 -> 7.0 -> 6.7 -Continue to monitor for signs and symptoms of infection next-repeat CBC in a.m  Generalized Weakness -In the setting of dehydration and acute blood loss anemia -PT/OT to Evaluate and Treat and recommending no Follow up   DVT prophylaxis: Anticoagulation with Apixaban held; Resume in 1 week; SCDs  Code Status: FULL CODE  Family Communication: Discussed with the wife at bedside Disposition Plan: Pending further clinical improvement and back to baseline and evaluation by PT OT and advancement of Diet; Will be getting a Por t this AM     Status is: Inpatient  Remains inpatient appropriate because:Unsafe d/c plan, IV treatments appropriate due to intensity of illness or inability to take PO and Inpatient level of care appropriate due to severity of illness   Dispo: The patient is from: Home              Anticipated d/c is to: Home with Home Health              Anticipated d/c date is:1 day              Patient currently is not medically stable to d/c.  Consultants:  Gastroenterology Notified Oncology via epic IR for port Placement    Procedures:   EGD  Findings:      A small hiatal hernia was present.      One benign-appearing, intrinsic mild stenosis was found. The stenosis       was traversed.      LA Grade A (one or more mucosal breaks less than 5 mm, not extending       between tops of 2 mucosal folds) esophagitis was found.      The exam of the esophagus was otherwise normal.      A few dispersed small erosions with no stigmata of recent bleeding were       found in the gastric body and in the gastric antrum.      The exam of the stomach was otherwise normal.      Mild inflammation was found in the duodenal bulb.      An acquired moderate stenosis was found in the second portion of the       duodenum and was non-traversed.      No old or fresh blood was seen to the extent of our examination. Impression:               - Small hiatal hernia.                           - Benign-appearing esophageal stenosis.                           - LA Grade A esophagitis.                           - Erosive gastropathy with no stigmata of recent  bleeding.                           - Bulbar duodenitis.                           - Acquired duodenal stenosis. Likely peptic                            stricture from prior episode of duodenal ulcer                            disease noted on EGD in 2016.                           - Overall bleeding most likely from esophagitis. Moderate  Sedation:      None Recommendation:           - Return patient to hospital ward for ongoing care.                           - Clear liquid diet today.                           - Continue present medications, including PPI.  Antimicrobials:  Anti-infectives (From admission, onward)   None       Subjective: Seen and examined at bedside in the chair at bedside had no complaints.  Felt well and stronger.  No nausea or vomiting.  Had no further bleeding episodes.  Denies any lightheadedness or dizziness.  Still has some lower extremity swelling.  No other concerns or complaints at this time.  Objective: Vitals:   10/06/20 1401 10/06/20 2022 10/07/20 0447 10/07/20 0500  BP: (!) 156/61 (!) 131/58 (!) 142/66   Pulse: 71 81 83   Resp: 18 18 18    Temp: 97.8 F (36.6 C) 98.5 F (36.9 C) 98.2 F (36.8 C)   TempSrc: Oral Oral Oral   SpO2: 99% 97% 94%   Weight:    109.6 kg  Height:        Intake/Output Summary (Last 24 hours) at 10/07/2020 1105 Last data filed at 10/07/2020 1028 Gross per 24 hour  Intake 970 ml  Output 1175 ml  Net -205 ml   Filed Weights   10/02/20 1640 10/06/20 0500 10/07/20 0500  Weight: 108.9 kg 109.6 kg 109.6 kg   Examination: Physical Exam:  Constitutional: WN/WD obese Caucasian male currently in no acute distress sitting in the chair at bedside and feels well and calm comfortable Eyes: Lids and conjunctivae normal, sclerae anicteric  ENMT: External Ears, Nose appear normal. Grossly normal hearing.  Neck: Appears normal, supple, no cervical masses, normal ROM, no appreciable thyromegaly; no JVD Respiratory: Diminished to auscultation bilaterally, no wheezing, rales, rhonchi or crackles. Normal respiratory effort and patient is not tachypenic. No accessory muscle use.  Unlabored breathing Cardiovascular: RRR, no murmurs / rubs / gallops. S1 and S2 auscultated.  Mild 1+ lower extremity edema Abdomen: Soft, non-tender, distended secondary by habitus. Bowel  sounds positive.  GU: Deferred. Musculoskeletal: No clubbing / cyanosis of digits/nails. No joint deformity upper and lower extremities.  Skin: No rashes, lesions, ulcers on limited skin evaluation but has some venous stasis changes with hemosiderin deposition  and chronic discoloration his lower extremities. No induration; Warm and dry.  Neurologic: CN 2-12 grossly intact with no focal deficits.  Romberg sign and cerebellar reflexes not assessed.  Psychiatric: Normal judgment and insight. Alert and oriented x 3. Normal mood and appropriate affect.   Data Reviewed: I have personally reviewed following labs and imaging studies  CBC: Recent Labs  Lab 10/03/20 0809 10/03/20 0809 10/04/20 0604 10/04/20 1403 10/05/20 0309 10/05/20 0846 10/05/20 1415 10/06/20 0657 10/07/20 0559  WBC 10.1  --  6.0  --  7.0  --   --  6.7 4.7  NEUTROABS 8.0*  --  5.0  --  5.7  --   --  5.4 3.8  HGB 7.5*   < > 6.6*   < > 8.6* 9.5* 9.4* 8.6* 8.5*  HCT 23.0*   < > 20.2*   < > 26.2* 29.3* 28.8* 26.6* 26.5*  MCV 93.5  --  92.2  --  91.9  --   --  92.0 92.7  PLT 233  --  197  --  168  --   --  148* 128*   < > = values in this interval not displayed.   Basic Metabolic Panel: Recent Labs  Lab 10/03/20 0809 10/04/20 0604 10/05/20 0309 10/06/20 0657 10/07/20 0559  NA 140 140 147* 143 143  K 4.2 4.1 4.2 4.0 4.9  CL 105 108 112* 109 110  CO2 23 25 28 26 25   GLUCOSE 163* 174* 89 60* 101*  BUN 78* 59* 39* 29* 28*  CREATININE 1.61* 1.42* 1.17 1.14 1.16  CALCIUM 8.1* 8.1* 8.4* 8.2* 8.5*  MG 2.1 2.1 2.0 1.9 1.8  PHOS 4.4 3.9 3.9 3.7 4.0   GFR: Estimated Creatinine Clearance: 61 mL/min (by C-G formula based on SCr of 1.16 mg/dL). Liver Function Tests: Recent Labs  Lab 10/03/20 0809 10/04/20 0604 10/05/20 0309 10/06/20 0657 10/07/20 0559  AST 30 25 22 25 24   ALT 19 21 17 17 20   ALKPHOS 54 53 56 60 68  BILITOT 0.4 0.5 0.8 0.8 0.8  PROT 5.2* 5.0* 5.0* 5.3* 5.6*  ALBUMIN 2.6* 2.5* 2.5* 2.7* 2.8*    Recent Labs  Lab 10/02/20 1747  LIPASE 28   No results for input(s): AMMONIA in the last 168 hours. Coagulation Profile: Recent Labs  Lab 10/07/20 0559  INR 1.1   Cardiac Enzymes: No results for input(s): CKTOTAL, CKMB, CKMBINDEX, TROPONINI in the last 168 hours. BNP (last 3 results) No results for input(s): PROBNP in the last 8760 hours. HbA1C: No results for input(s): HGBA1C in the last 72 hours. CBG: Recent Labs  Lab 10/06/20 0924 10/06/20 1151 10/06/20 1657 10/06/20 2232 10/07/20 0719  GLUCAP 140* 134* 101* 156* 95   Lipid Profile: No results for input(s): CHOL, HDL, LDLCALC, TRIG, CHOLHDL, LDLDIRECT in the last 72 hours. Thyroid Function Tests: No results for input(s): TSH, T4TOTAL, FREET4, T3FREE, THYROIDAB in the last 72 hours. Anemia Panel: No results for input(s): VITAMINB12, FOLATE, FERRITIN, TIBC, IRON, RETICCTPCT in the last 72 hours. Sepsis Labs: No results for input(s): PROCALCITON, LATICACIDVEN in the last 168 hours.  Recent Results (from the past 240 hour(s))  Respiratory Panel by RT PCR (Flu A&B, Covid) - Nasopharyngeal Swab     Status: None   Collection Time: 10/02/20  5:57 PM   Specimen: Nasopharyngeal Swab  Result Value Ref Range Status   SARS Coronavirus 2 by RT PCR NEGATIVE NEGATIVE Final    Comment: (NOTE) SARS-CoV-2 target nucleic acids are NOT  DETECTED.  The SARS-CoV-2 RNA is generally detectable in upper respiratoy specimens during the acute phase of infection. The lowest concentration of SARS-CoV-2 viral copies this assay can detect is 131 copies/mL. A negative result does not preclude SARS-Cov-2 infection and should not be used as the sole basis for treatment or other patient management decisions. A negative result may occur with  improper specimen collection/handling, submission of specimen other than nasopharyngeal swab, presence of viral mutation(s) within the areas targeted by this assay, and inadequate number of viral  copies (<131 copies/mL). A negative result must be combined with clinical observations, patient history, and epidemiological information. The expected result is Negative.  Fact Sheet for Patients:  PinkCheek.be  Fact Sheet for Healthcare Providers:  GravelBags.it  This test is no t yet approved or cleared by the [place] FDA and  has been authorized for detection and/or diagnosis of SARS-CoV-2 by FDA under an Emergency Use Authorization (EUA). This EUA will remain  in effect (meaning this test can be used) for the duration of the COVID-19 declaration under Section 564(b)(1) of the Act, 21 U.S.C. section 360bbb-3(b)(1), unless the authorization is terminated or revoked sooner.     Influenza A by PCR NEGATIVE NEGATIVE Final   Influenza B by PCR NEGATIVE NEGATIVE Final    Comment: (NOTE) The Xpert Xpress SARS-CoV-2/FLU/RSV assay is intended as an aid in  the diagnosis of influenza from Nasopharyngeal swab specimens and  should not be used as a sole basis for treatment. Nasal washings and  aspirates are unacceptable for Xpert Xpress SARS-CoV-2/FLU/RSV  testing.  Fact Sheet for Patients: PinkCheek.be  Fact Sheet for Healthcare Providers: GravelBags.it  This test is not yet approved or cleared by the [place] FDA and  has been authorized for detection and/or diagnosis of SARS-CoV-2 by  FDA under an Emergency Use Authorization (EUA). This EUA will remain  in effect (meaning this test can be used) for the duration of the  Covid-19 declaration under Section 564(b)(1) of the Act, 21  U.S.C. section 360bbb-3(b)(1), unless the authorization is  terminated or revoked. Performed at [facility], [place] [address] [place], [place] [address]     RN Pressure Injury Documentation:     Estimated body mass index is 37.84 kg/m as calculated from the  following:   Height as of this encounter: 5\' 7"  (1.702 m).   Weight as of this encounter: 109.6 kg.  Malnutrition Type:  Nutrition Problem: Increased nutrient needs Etiology: cancer and cancer related treatments   Malnutrition Characteristics:  Signs/Symptoms: estimated needs   Nutrition Interventions:  Interventions: Glucerna shake Radiology Studies: No results found. Scheduled Meds: . sodium chloride   Intravenous Once  . donepezil  10 mg Oral QHS  . feeding supplement (GLUCERNA SHAKE)  237 mL Oral TID BM  . folic acid  1 mg Oral Daily  . insulin aspart  0-9 Units Subcutaneous TID WC  . insulin glargine  32 Units Subcutaneous QHS  . levothyroxine  75 mcg Oral QAC breakfast  . metoprolol succinate  25 mg Oral Daily  . ondansetron (ZOFRAN) IV  4 mg Intravenous Once  . pantoprazole  40 mg Oral BID AC  . rosuvastatin  20 mg Oral Daily  . vitamin B-12  1,000 mcg Oral Daily   Continuous Infusions: . magnesium sulfate bolus IVPB 2 g (10/07/20 1028)    LOS: 5 days   Kerney Elbe, DO [facility] PAGER is on [place]  If 7PM-7AM, please contact night-coverage [email or web address]

## 2020-10-07 NOTE — Progress Notes (Signed)
HEMATOLOGY-ONCOLOGY PROGRESS NOTE  SUBJECTIVE: Mr. Martinique presented to the hospital with weakness and nausea.  He was also experiencing some shortness of breath.  He took an antiemetic at home and became weaker and not himself.  He was slightly confused and was brought to the emergency room for evaluation.  He was found to have significant dehydration.  Labs on admission showed a WBC of 14, hemoglobin 8.4, platelet 292,000, BUN 82, creatinine 1.7.  Chest x-ray showed bilateral masses consistent with lung cancer.  He developed black, tarry stools this admission and his hemoglobin dropped to 6.6.  He was evaluated by GI and had an EGD.  Which showed esophagitis.  He was placed on a PPI and Eliquis has been placed on hold for at least a week.  The patient was seen today and his wife is at the bedside.  He reports that he feels much better.  He is no longer having any black, tarry stools.  Breathing is stable.  No nausea or vomiting reported.  He is scheduled for Port-A-Cath placement by IR today.  Oncology History  Adenocarcinoma of right lung, stage 4 ([place])  09/02/2020 Initial Diagnosis   Adenocarcinoma of right lung, stage 4 ([place])   09/09/2020 -  Chemotherapy   The patient had palonosetron (ALOXI) injection 0.25 mg, 0.25 mg, Intravenous,  Once, 2 of 4 cycles Administration: 0.25 mg (09/09/2020), 0.25 mg (09/30/2020) PEMEtrexed (ALIMTA) 1,100 mg in sodium chloride 0.9 % 100 mL chemo infusion, 500 mg/m2 = 1,100 mg, Intravenous,  Once, 2 of 6 cycles Administration: 1,100 mg (09/09/2020), 1,100 mg (09/30/2020) CARBOplatin (PARAPLATIN) 450 mg in sodium chloride 0.9 % 250 mL chemo infusion, 450 mg (100 % of original dose 452.5 mg), Intravenous,  Once, 2 of 4 cycles Dose modification: 452.5 mg (original dose 452.5 mg, Cycle 1), 447.5 mg (original dose 447.5 mg, Cycle 2) Administration: 450 mg (09/09/2020), 450 mg (09/30/2020) fosaprepitant (EMEND) 150 mg in sodium chloride 0.9 % 145 mL IVPB, 150 mg,  Intravenous,  Once, 2 of 4 cycles Administration: 150 mg (09/09/2020), 150 mg (09/30/2020) pembrolizumab (KEYTRUDA) 200 mg in sodium chloride 0.9 % 50 mL chemo infusion, 200 mg, Intravenous, Once, 2 of 6 cycles Administration: 200 mg (09/09/2020), 200 mg (09/30/2020)  for chemotherapy treatment.       REVIEW OF SYSTEMS:   Constitutional: Denies fevers, chills  Respiratory: Shortness of breath improved. Cardiovascular: Denies palpitation, chest discomfort Gastrointestinal:  Denies nausea, heartburn or change in bowel habits Skin: Denies abnormal skin rashes Lymphatics: Denies new lymphadenopathy or easy bruising Neurological:Denies numbness, tingling or new weaknesses Behavioral/Psych: Mood is stable, no new changes  Extremities: No lower extremity edema All other systems were reviewed with the patient and are negative.  I have reviewed the past medical history, past surgical history, social history and family history with the patient and they are unchanged from previous note.   PHYSICAL EXAMINATION: ECOG PERFORMANCE STATUS: 2 - Symptomatic, <50% confined to bed  Vitals:   10/07/20 1417 10/07/20 1528  BP: (!) 159/81 (!) 156/68  Pulse: 73 66  Resp: 18 (!) 27  Temp: (!) 97.5 F (36.4 C)   SpO2: 98% 96%   Filed Weights   10/02/20 1640 10/06/20 0500 10/07/20 0500  Weight: 108.9 kg 109.6 kg 109.6 kg    Intake/Output from previous day: 10/24 0701 - 10/25 0700 In: 1320 [P.O.:1320] Out: 1375 [Urine:1375]  GENERAL:alert, no distress and comfortable SKIN: skin color, texture, turgor are normal, no rashes or significant lesions LUNGS: Diminished breath sounds  HEART: regular rate & rhythm and no murmurs, trace bilateral lower extremity edema ABDOMEN:abdomen soft, non-tender and normal bowel sounds Musculoskeletal:no cyanosis of digits and no clubbing  NEURO: alert & oriented x 3 with fluent speech, no focal motor/sensory deficits  LABORATORY DATA:  I have reviewed the data as  listed CMP Latest Ref Rng & Units 10/07/2020 10/06/2020 10/05/2020  Glucose 70 - 99 mg/dL 101(H) 60(L) 89  BUN 8 - 23 mg/dL 28(H) 29(H) 39(H)  Creatinine 0.61 - 1.24 mg/dL 1.16 1.14 1.17  Sodium 135 - 145 mmol/L 143 143 147(H)  Potassium 3.5 - 5.1 mmol/L 4.9 4.0 4.2  Chloride 98 - 111 mmol/L 110 109 112(H)  CO2 22 - 32 mmol/L '25 26 28  ' Calcium 8.9 - 10.3 mg/dL 8.5(L) 8.2(L) 8.4(L)  Total Protein 6.5 - 8.1 g/dL 5.6(L) 5.3(L) 5.0(L)  Total Bilirubin 0.3 - 1.2 mg/dL 0.8 0.8 0.8  Alkaline Phos 38 - 126 U/L 68 60 56  AST 15 - 41 U/L '24 25 22  ' ALT 0 - 44 U/L '20 17 17    ' Lab Results  Component Value Date   WBC 4.7 10/07/2020   HGB 8.5 (L) 10/07/2020   HCT 26.5 (L) 10/07/2020   MCV 92.7 10/07/2020   PLT 128 (L) 10/07/2020   NEUTROABS 3.8 10/07/2020    CT ABDOMEN PELVIS W CONTRAST  Result Date: 09/19/2020 CLINICAL DATA:  History of non-small fall lung cancer, epigastric abdominal pain EXAM: CT ABDOMEN AND PELVIS WITH CONTRAST TECHNIQUE: Multidetector CT imaging of the abdomen and pelvis was performed using the standard protocol following bolus administration of intravenous contrast. CONTRAST:  161m OMNIPAQUE IOHEXOL 300 MG/ML  SOLN COMPARISON:  Head CT August 22, 2020 FINDINGS: Lower chest: There is mild cardiomegaly. Aortic atherosclerosis seen. Again noted is a partially visualized rounded airspace consolidation/mass in the posterior right lower lobe and left infrahilar region. A trace right pleural effusion is present. There is also a 9 mm posterior left lower lobe nodule which was seen on the prior exam. Hepatobiliary: The liver is normal in density without focal abnormality.The main portal vein is patent. The patient is status post cholecystectomy. No biliary ductal dilation. Pancreas: There is mild inflammatory changes which there seen adjacent to the pancreatic head which could be from the duodenum. No pancreatic ductal dilatation, or fluid collections. Spleen: Normal in size without  focal abnormality. Adrenals/Urinary Tract: Both adrenal glands appear normal. A 3 cm low-density lesion seen off lower pole of the right kidney. Bladder is unremarkable. Stomach/Bowel: A small hiatal hernia is present. The stomach is otherwise unremarkable. There is diffuse wall thickening seen throughout the duodenum with hyperenhancement and surrounding mesenteric fat stranding changes. A possible focal diverticulum is within the second portion of the duodenum. No definite loculated fluid collections or free air however are noted. Inflammatory changes are seen tracking into the right pericolic gutter. The remainder of the small bowel is unremarkable. There is a moderate amount of stool however it is unremarkable. The appendix is unremarkable Vascular/Lymphatic: There are no enlarged mesenteric, retroperitoneal, or pelvic lymph nodes. Scattered aortic atherosclerotic calcifications are seen without aneurysmal dilatation. Reproductive: The prostate is unremarkable. Other: No evidence of abdominal wall mass or hernia. Musculoskeletal: No acute or significant osseous findings. Again noted is a lobular soft tissue mass seen overlying the left gluteal soft tissues with internal rounded calcifications. This may be due to prior injury. IMPRESSION: Findings suggestive of extensive acute duodenitis which may be due to infectious or inflammatory process. Probable duodenal diverticulum  is noted however no loculated fluid collections or free air. Mild inflammatory changes around the pancreatic head which may be due to the adjacent duodenum versus concomitant mild pancreatitis. Rounded areas of masslike consolidation in the bilateral lungs as on the recent PET CT August 22, 2020. Stable 9 mm pulmonary nodule in the posterior left lower lung Aortic Atherosclerosis (ICD10-I70.0). Electronically Signed   By: Prudencio Pair M.D.   On: 09/19/2020 22:23   DG Chest Portable 1 View  Result Date: 10/02/2020 CLINICAL DATA:  History  of known lung cancer with weakness, initial encounter EXAM: PORTABLE CHEST 1 VIEW COMPARISON:  PET-CT from 08/22/2020 FINDINGS: Cardiac shadow is stable. Postsurgical changes are again seen. Bilateral soft tissue lung masses are noted consistent with the given clinical history. Elevation the right hemidiaphragm is again noted. No sizable effusion or pneumothorax is seen. No acute bony abnormality is noted. IMPRESSION: Bilateral masses consistent with the given clinical history of lung carcinoma. No acute abnormality noted. Electronically Signed   By: Inez Catalina M.D.   On: 10/02/2020 17:51    ASSESSMENT AND PLAN: This is a very pleasant 79 year old white male diagnosed with stage IV (T4, N2, M1a) non-small cell lung cancer, mucinous adenocarcinoma who presented with bilateral large masses in the left upper lobe as well as the right upper lobe in addition to other pulmonary nodules in the right upper lobe with hilar and mediastinal lymphadenopathy diagnosed in August 2021.  Molecular studies showed positive KRAS G12C mutation and PD-L1 expression of 20% The patient is currently receiving systemic chemotherapy with carboplatin for an AUC of 5, Alimta 500 mg/m, and Keytruda 200 mg IV every 3 weeks.  Status post 2 cycles.  He is now admitted to the hospital due to dehydration and acute on chronic CKD.  He developed a GI bleed this admission and underwent upper endoscopy and was found to have esophagitis.  His Eliquis has been stopped. Now on PPI and no recurrent melena.  His hemoglobin is currently stable.  The patient is for Port-A-Cath placement by IR later today.  I will arrange for a flush appointment with all of his labs in our office.  The patient's wife states that she already has EMLA cream on hand.  She was advised not to use this for at least 2 weeks as it will loosen the Dermabond on the incision site.  The patient was advised to keep his previously scheduled appointments.    LOS: 5 days    Mikey Bussing, DNP, AGPCNP-BC, AOCNP 10/07/20

## 2020-10-07 NOTE — TOC Progression Note (Signed)
Transition of Care [facility]) - Progression Note    Patient Details  Name: Randall Wilkins MRN: [id number] Date of Birth: 08-Nov-1941  [facility] CM/SW Contact  Joaquin Courts, RN Phone Number: 10/07/2020, 1:28 PM  Clinical Narrative:    CM spoke with patient regarding recommendation for HHPT/OT services.  Referral given to [facility] for HHPT/OT.  MD please place [place] orders prior to dc.     Expected Discharge Plan: [place] Barriers to Discharge: Continued Medical Work up  Expected Discharge Plan and Services Expected Discharge Plan: [place]   Discharge Planning Services: CM Consult Post Acute Care Choice: [place] arrangements for the past 2 months: Single Family Home                           HH Arranged: PT, OT [place] Agency: [place] ([place]) Date [place]: 10/07/20 Time [place]: [id number] Representative spoke with at [place]: [place] ([place]) Interventions    Readmission Risk Interventions No flowsheet data found.

## 2020-10-07 NOTE — Progress Notes (Signed)
Chief Complaint: Patient was seen in consultation today for port placement  Supervising Physician: Dr. Serafina Royals  Patient Status: [facility] - In-pt  History of Present Illness: Randall Wilkins is a 79 y.o. male with hx of lung cancer. Was scheduled to have port placed as outpt but had top be admitted for GI bleed. Has stabilized from that and IR is now asked to place port during this admission. PMHx, meds, labs, imaging, allergies reviewed. Anticoagulation has been stopped Feels well, no recent fevers, chills, illness. Has been NPO today as directed. Family at bedside.   Past Medical History:  Diagnosis Date  . Aortic stenosis, mild 11/14/2013  . Aortic valve sclerosis 03/29/2015  . Bilateral leg edema 05/21/2014  . CAD (coronary artery disease)   . Chronic diastolic congestive heart failure ([place])   . Chronic kidney disease (CKD), stage III (moderate) (HCC)   . Diabetes 1.5, managed as type 1 ([place]) 02/04/2013  . Diabetic peripheral neuropathy associated with type 1 diabetes mellitus ([place]) 11/14/2013  . Dysphagia   . Dyspnea on exertion 03/21/2015  . Exogenous obesity   . Gout   . Heart attack ([place])   . History of nuclear stress test 07/2011   dipyridamole; fixed inferolateral defect, worse at stress than rest; no reversible ischemia; low risk scan   . Hyperlipidemia   . Hypertension   . Hypothyroidism   . Insulin dependent diabetes mellitus   . Left foot drop   . Left main coronary artery disease 03/28/2015  . Memory loss   . Obesity (BMI 30-39.9) 11/14/2013  . Obstructive sleep apnea 03/21/2015  . OSA on CPAP    uses a cpap  . Peptic ulcer with hemorrhage 03/28/2015  . Peripheral neuropathy   . Pneumonia   . Rhabdomyolysis   . 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 Stroke ([place])    L patietal with small scattered lacunar infarcts  . Thrombocytopenia ([place]) 03/21/2015  . Venous insufficiency   . Weakness generalized 03/21/2015    Past Surgical  History:  Procedure Laterality Date  . BACK SURGERY  2002   lumbosacral. 11 back surgeries total  . BRONCHIAL BIOPSY  08/06/2020   Procedure: BRONCHIAL BIOPSIES;  Surgeon: Collene Gobble, MD;  Location: [facility] ENDOSCOPY;  Service: Pulmonary;;  . BRONCHIAL BRUSHINGS  08/06/2020   Procedure: BRONCHIAL BRUSHINGS;  Surgeon: Collene Gobble, MD;  Location: [facility] ENDOSCOPY;  Service: Pulmonary;;  . BRONCHIAL NEEDLE ASPIRATION BIOPSY  08/06/2020   Procedure: BRONCHIAL NEEDLE ASPIRATION BIOPSIES;  Surgeon: Collene Gobble, MD;  Location: [facility] ENDOSCOPY;  Service: Pulmonary;;  . Carotid Doppler  03/2013   bilat bulb/prox ICAs - mild amount of fibrous plaque with no evidence of diameter reduction  . CARPAL TUNNEL RELEASE Bilateral 08/09/2014   Procedure: BILATERAL CARPAL TUNNEL RELEASE;  Surgeon: Daryll Brod, MD;  Location: [place];  Service: Orthopedics;  Laterality: Bilateral;  ANESTHESIA:  IV REGIONAL BIL FAB  . CHOLECYSTECTOMY    . COLONOSCOPY    . CORONARY ANGIOPLASTY  10/13/1996  . CORONARY ANGIOPLASTY  09/21/1989   emergency PTCA  . CORONARY ANGIOPLASTY  10/13/1996   Multi-Link diagonal & OD stenting (Dr. Marella Chimes)  . CORONARY ANGIOPLASTY  12/03/1997   disease of mid DX-1 ~50% & in mid PLA & PDA (distal lesions) (Dr. Marella Chimes)   . CORONARY ANGIOPLASTY  10/14/1999   progression of disease distal PLA & PDA; progression of disease  prox RCA - moderate (Dr. Marella Chimes)   . CORONARY ANGIOPLASTY WITH STENT PLACEMENT  04/04/2004   4.0x25mm non-DES (thrombectomy via AngioJet) to RCA for high grade stenosis (Dr. Marella Chimes)  . CORONARY ARTERY BYPASS GRAFT N/A 03/29/2015   Procedure: CORONARY ARTERY BYPASS GRAFTING TIMES TWO USING LEFT INTERNAL MAMMARY ARTERY AND RIGHT LEG GREATER SAPHENOUS VEIN HARVESTED ENDOSCOPICALLY.;  Surgeon: Rexene Alberts, MD;  Location: [place];  Service: Open Heart Surgery;  Laterality: N/A;  . ESOPHAGOGASTRODUODENOSCOPY N/A 03/27/2015   Procedure:  ESOPHAGOGASTRODUODENOSCOPY (EGD);  Surgeon: Clarene Essex, MD;  Location: [facility] ENDOSCOPY;  Service: Endoscopy;  Laterality: N/A;  possible dilation  . FINE NEEDLE ASPIRATION  08/06/2020   Procedure: FINE NEEDLE ASPIRATION (FNA) LINEAR;  Surgeon: Collene Gobble, MD;  Location: [place] ENDOSCOPY;  Service: Pulmonary;;  . LEFT HEART CATHETERIZATION WITH CORONARY ANGIOGRAM N/A 03/28/2015   Procedure: LEFT HEART CATHETERIZATION WITH CORONARY ANGIOGRAM;  Surgeon: Peter M Martinique, MD;  Location: [facility] CATH LAB;  Service: Cardiovascular;  Laterality: N/A;  . SINUS ENDO W/FUSION    . TONSILLECTOMY    . TRANSTHORACIC ECHOCARDIOGRAM  08/08/2013   EF 55-60%, mild conc hypertrophy, grade 1 diastolic dysfunction; AV with mild stenosis; LA & RA mildly dilated  . VIDEO BRONCHOSCOPY WITH ENDOBRONCHIAL NAVIGATION N/A 08/06/2020   Procedure: VIDEO BRONCHOSCOPY WITH ENDOBRONCHIAL NAVIGATION;  Surgeon: Collene Gobble, MD;  Location: [facility] ENDOSCOPY;  Service: Pulmonary;  Laterality: N/A;  . VIDEO BRONCHOSCOPY WITH ENDOBRONCHIAL ULTRASOUND N/A 08/06/2020   Procedure: VIDEO BRONCHOSCOPY WITH ENDOBRONCHIAL ULTRASOUND;  Surgeon: Collene Gobble, MD;  Location: [facility] ENDOSCOPY;  Service: Pulmonary;  Laterality: N/A;    Allergies: Actos [pioglitazone], Metformin and related, and Niaspan [niacin er]  Medications:  Current Facility-Administered Medications:  .  0.9 %  sodium chloride infusion (Manually program via Guardrails IV Fluids), , Intravenous, Once, Arta Silence, MD .  acetaminophen (TYLENOL) tablet 650 mg, 650 mg, Oral, Q6H PRN **OR** acetaminophen (TYLENOL) suppository 650 mg, 650 mg, Rectal, Q6H PRN, Arta Silence, MD .  albuterol (VENTOLIN HFA) 108 (90 Base) MCG/ACT inhaler 2 puff, 2 puff, Inhalation, Q6H PRN, Arta Silence, MD .  calcium carbonate (dosed in mg elemental calcium) suspension 500 mg of elemental calcium, 500 mg of elemental calcium, Oral, Q6H PRN, Arta Silence, MD .  camphor-menthol [facility]) lotion 1  application, 1 application, Topical, L9F PRN **AND** hydrOXYzine (ATARAX/VISTARIL) tablet 25 mg, 25 mg, Oral, Q8H PRN, Arta Silence, MD .  docusate sodium (ENEMEEZ) enema 283 mg, 1 enema, Rectal, PRN, Arta Silence, MD .  donepezil (ARICEPT) tablet 10 mg, 10 mg, Oral, QHS, Arta Silence, MD, 10 mg at 10/06/20 2226 .  feeding supplement (GLUCERNA SHAKE) (GLUCERNA SHAKE) liquid 237 mL, 237 mL, Oral, TID BM, Arta Silence, MD, 237 mL at 10/06/20 2000 .  folic acid (FOLVITE) tablet 1 mg, 1 mg, Oral, Daily, Arta Silence, MD, 1 mg at 10/07/20 1014 .  insulin aspart (novoLOG) injection 0-9 Units, 0-9 Units, Subcutaneous, TID WC, Raiford Noble [place], [place], 1 Units at 10/06/20 1211 .  insulin glargine (LANTUS) injection 32 Units, 32 Units, Subcutaneous, QHS, Raiford Noble [place], [place], [place] Units at 10/06/20 2226 .  levothyroxine (SYNTHROID) tablet 75 mcg, 75 mcg, Oral, QAC breakfast, Arta Silence, MD, 75 mcg at 10/07/20 0521 .  metoprolol succinate (TOPROL-XL) 24 hr tablet 25 mg, 25 mg, Oral, Daily, Arta Silence, MD, 25 mg at 10/07/20 1014 .  ondansetron (ZOFRAN) injection 4 mg, 4 mg, Intravenous, Once, Arta Silence, MD .  ondansetron [facility]) tablet 4  mg, 4 mg, Oral, Q6H PRN **OR** ondansetron (ZOFRAN) injection 4 mg, 4 mg, Intravenous, Q6H PRN, Arta Silence, MD .  pantoprazole (PROTONIX) EC tablet 40 mg, 40 mg, Oral, BID AC, Arta Silence, MD, 40 mg at 10/07/20 0724 .  rosuvastatin (CRESTOR) tablet 20 mg, 20 mg, Oral, Daily, Arta Silence, MD, 20 mg at 10/07/20 1014 .  sorbitol 70 % solution 30 mL, 30 mL, Oral, PRN, Arta Silence, MD .  tamsulosin [facility]) capsule 0.4 mg, 0.4 mg, Oral, Daily, Sheikh, Omair Latif, DO .  vitamin B-12 (CYANOCOBALAMIN) tablet 1,000 mcg, 1,000 mcg, Oral, Daily, Arta Silence, MD, 1,000 mcg at 10/07/20 1014 .  zolpidem (AMBIEN) tablet 5 mg, 5 mg, Oral, QHS PRN, Arta Silence, MD, 5 mg at 10/06/20 2226    Family History  Problem Relation Age of  Onset  . Heart disease Mother   . Coronary artery disease Father   . Cancer Maternal Grandmother   . Heart Problems Maternal Grandfather   . Diabetes Son        borderline     Social History   Socioeconomic History  . Marital status: Married    Spouse name: Not on file  . Number of children: 3  . Years of education: 42  . Highest education level: Not on file  Occupational History  . Not on file  Tobacco Use  . Smoking status: Former Smoker    Packs/day: 2.00    Years: 20.00    Pack years: 40.00    Types: Cigarettes    Quit date: 01/25/1974    Years since quitting: 46.7  . Smokeless tobacco: Never Used  Vaping Use  . Vaping Use: Never used  Substance and Sexual Activity  . Alcohol use: No    Alcohol/week: 0.0 standard drinks  . Drug use: No  . Sexual activity: Not on file  Other Topics Concern  . Not on file  Social History Narrative   Lives with wife.    Social Determinants of Health   Financial Resource Strain:   . Difficulty of Paying Living Expenses: Not on file  Food Insecurity:   . Worried About Charity fundraiser in the Last Year: Not on file  . Ran Out of Food in the Last Year: Not on file  Transportation Needs:   . Lack of Transportation (Medical): Not on file  . Lack of Transportation (Non-Medical): Not on file  Physical Activity:   . Days of Exercise per Week: Not on file  . Minutes of Exercise per Session: Not on file  Stress:   . Feeling of Stress : Not on file  Social Connections:   . Frequency of Communication with Friends and Family: Not on file  . Frequency of Social Gatherings with Friends and Family: Not on file  . Attends Religious Services: Not on file  . Active Member of Clubs or Organizations: Not on file  . Attends Archivist Meetings: Not on file  . Marital Status: Not on file    Review of Systems: A 12 point ROS discussed and pertinent positives are indicated in the HPI above.  All other systems are negative.  Review  of Systems  Vital Signs: BP (!) 142/66 (BP Location: Left Arm)   Pulse 83   Temp 98.2 F (36.8 C) (Oral)   Resp 18   Ht 5\' 7"  (1.702 m)   Wt 109.6 kg   SpO2 94%   BMI 37.84 kg/m   Physical Exam Constitutional:  Appearance: Normal appearance.  HENT:     Mouth/Throat:     Mouth: Mucous membranes are moist.     Pharynx: Oropharynx is clear.  Cardiovascular:     Rate and Rhythm: Normal rate and regular rhythm.     Heart sounds: Normal heart sounds.  Pulmonary:     Effort: Pulmonary effort is normal. No respiratory distress.     Breath sounds: Normal breath sounds.  Skin:    General: Skin is warm and dry.  Neurological:     General: No focal deficit present.     Mental Status: He is alert and oriented to person, place, and time.  Psychiatric:        Mood and Affect: Mood normal.        Thought Content: Thought content normal.        Judgment: Judgment normal.     Imaging: CT ABDOMEN PELVIS W CONTRAST  Result Date: 09/19/2020 CLINICAL DATA:  History of non-small fall lung cancer, epigastric abdominal pain EXAM: CT ABDOMEN AND PELVIS WITH CONTRAST TECHNIQUE: Multidetector CT imaging of the abdomen and pelvis was performed using the standard protocol following bolus administration of intravenous contrast. CONTRAST:  186mL OMNIPAQUE IOHEXOL 300 MG/ML  SOLN COMPARISON:  Head CT August 22, 2020 FINDINGS: Lower chest: There is mild cardiomegaly. Aortic atherosclerosis seen. Again noted is a partially visualized rounded airspace consolidation/mass in the posterior right lower lobe and left infrahilar region. A trace right pleural effusion is present. There is also a 9 mm posterior left lower lobe nodule which was seen on the prior exam. Hepatobiliary: The liver is normal in density without focal abnormality.The main portal vein is patent. The patient is status post cholecystectomy. No biliary ductal dilation. Pancreas: There is mild inflammatory changes which there seen adjacent to  the pancreatic head which could be from the duodenum. No pancreatic ductal dilatation, or fluid collections. Spleen: Normal in size without focal abnormality. Adrenals/Urinary Tract: Both adrenal glands appear normal. A 3 cm low-density lesion seen off lower pole of the right kidney. Bladder is unremarkable. Stomach/Bowel: A small hiatal hernia is present. The stomach is otherwise unremarkable. There is diffuse wall thickening seen throughout the duodenum with hyperenhancement and surrounding mesenteric fat stranding changes. A possible focal diverticulum is within the second portion of the duodenum. No definite loculated fluid collections or free air however are noted. Inflammatory changes are seen tracking into the right pericolic gutter. The remainder of the small bowel is unremarkable. There is a moderate amount of stool however it is unremarkable. The appendix is unremarkable Vascular/Lymphatic: There are no enlarged mesenteric, retroperitoneal, or pelvic lymph nodes. Scattered aortic atherosclerotic calcifications are seen without aneurysmal dilatation. Reproductive: The prostate is unremarkable. Other: No evidence of abdominal wall mass or hernia. Musculoskeletal: No acute or significant osseous findings. Again noted is a lobular soft tissue mass seen overlying the left gluteal soft tissues with internal rounded calcifications. This may be due to prior injury. IMPRESSION: Findings suggestive of extensive acute duodenitis which may be due to infectious or inflammatory process. Probable duodenal diverticulum is noted however no loculated fluid collections or free air. Mild inflammatory changes around the pancreatic head which may be due to the adjacent duodenum versus concomitant mild pancreatitis. Rounded areas of masslike consolidation in the bilateral lungs as on the recent PET CT August 22, 2020. Stable 9 mm pulmonary nodule in the posterior left lower lung Aortic Atherosclerosis (ICD10-I70.0).  Electronically Signed   By: Prudencio Pair M.D.   On: 09/19/2020  22:23   DG Chest Portable 1 View  Result Date: 10/02/2020 CLINICAL DATA:  History of known lung cancer with weakness, initial encounter EXAM: PORTABLE CHEST 1 VIEW COMPARISON:  PET-CT from 08/22/2020 FINDINGS: Cardiac shadow is stable. Postsurgical changes are again seen. Bilateral soft tissue lung masses are noted consistent with the given clinical history. Elevation the right hemidiaphragm is again noted. No sizable effusion or pneumothorax is seen. No acute bony abnormality is noted. IMPRESSION: Bilateral masses consistent with the given clinical history of lung carcinoma. No acute abnormality noted. Electronically Signed   By: Inez Catalina M.D.   On: 10/02/2020 17:51    Labs:  CBC: Recent Labs    10/04/20 0604 10/04/20 1403 10/05/20 0309 10/05/20 0309 10/05/20 0846 10/05/20 1415 10/06/20 0657 10/07/20 0559  WBC 6.0  --  7.0  --   --   --  6.7 4.7  HGB 6.6*   < > 8.6*   < > 9.5* 9.4* 8.6* 8.5*  HCT 20.2*   < > 26.2*   < > 29.3* 28.8* 26.6* 26.5*  PLT 197  --  168  --   --   --  148* 128*   < > = values in this interval not displayed.    COAGS: Recent Labs    07/22/20 1218 07/29/20 1000 08/06/20 0558 10/07/20 0559  INR 1.4* 1.5* 1.0 1.1  APTT  --  35.7*  --   --     BMP: Recent Labs    08/20/20 1437 08/20/20 1437 09/02/20 1033 09/02/20 1033 09/09/20 0745 09/09/20 0745 09/16/20 7416 09/19/20 1821 10/04/20 0604 10/05/20 0309 10/06/20 0657 10/07/20 0559  NA 140   < > 141   < > 142   < > 140   < > 140 147* 143 143  K 4.4   < > 3.9   < > 4.2   < > 4.8   < > 4.1 4.2 4.0 4.9  CL 102   < > 102   < > 105   < > 106   < > 108 112* 109 110  CO2 30   < > 31   < > 29   < > 28   < > 25 28 26 25   GLUCOSE 140*   < > 135*   < > 146*   < > 118*   < > 174* 89 60* 101*  BUN 19   < > 20   < > 16   < > 28*   < > 59* 39* 29* 28*  CALCIUM 9.5   < > 9.5   < > 9.0   < > 8.8*   < > 8.1* 8.4* 8.2* 8.5*  CREATININE 1.42*    < > 1.39*   < > 1.34*   < > 1.19   < > 1.42* 1.17 1.14 1.16  GFRNONAA 47*   < > 48*   < > 50*   < > 58*   < > 50* >60 >60 >60  GFRAA 54*  --  55*  --  58*  --  >60  --   --   --   --   --    < > = values in this interval not displayed.    LIVER FUNCTION TESTS: Recent Labs    10/04/20 0604 10/05/20 0309 10/06/20 0657 10/07/20 0559  BILITOT 0.5 0.8 0.8 0.8  AST 25 22 25 24   ALT 21 17 17 20   ALKPHOS 53 56 60 68  PROT 5.0* 5.0* 5.3* 5.6*  ALBUMIN 2.5* 2.5* 2.7* 2.8*    TUMOR MARKERS: No results for input(s): AFPTM, CEA, CA199, CHROMGRNA in the last 8760 hours.  Assessment and Plan: Lung cancer For port placement Labs reviewed, stable Risks and benefits of image guided port-a-catheter placement was discussed with the patient including, but not limited to bleeding, infection, pneumothorax, or fibrin sheath development and need for additional procedures.  All of the patient's questions were answered, patient is agreeable to proceed. Consent signed and in chart.    Thank you for this interesting consult.  I greatly enjoyed meeting Jiovany S Wilkins and look forward to participating in their care.  A copy of this report was sent to the requesting provider on this date.  Electronically Signed: Ascencion Dike, PA-C 10/07/2020, 11:48 AM   I spent a total of 20 minutes in face to face in clinical consultation, greater than 50% of which was counseling/coordinating care for port

## 2020-10-07 NOTE — Progress Notes (Signed)
Physical Therapy Treatment Patient Details Name: Randall Wilkins MRN: [id number] DOB: 02-07-1941 Today's Date: 10/07/2020    History of Present Illness Patient is 79 y.o. 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 Pt [facility] via EMS after feelign weak, nauseaous at home. Pt's wife reports she helped him slowly lower to the ground on his knees and called for EMS as he briefly lost consciousness. Pt admitted for AKI. s/p EGD.    PT Comments    Pt OOB in recliner on RA with spouse visiting.  Feeling better.  Assisted with a short walk.  General Gait Details: decreased distancve due to dyspnea.  RA started at 1005 and decreased to 91% with 2/4 dyspnea.  "that's about how I walk"  Pt plans to D/C to home with wife.    Follow Up Recommendations  Home health PT     Equipment Recommendations  None recommended by PT    Recommendations for Other Services       Precautions / Restrictions Precautions Precaution Comments: Hx Lung CA    Mobility  Bed Mobility               General bed mobility comments: OOB in recliner  Transfers Overall transfer level: Needs assistance Equipment used: Rolling walker (2 wheeled) Transfers: Sit to/from Stand Sit to Stand: Supervision;Min guard Stand pivot transfers: Min assist       General transfer comment: Increased time. Cues for safety.  Slight effort.  Ambulation/Gait Ambulation/Gait assistance: Min guard Gait Distance (Feet): 44 Feet Assistive device: Rolling walker (2 wheeled) Gait Pattern/deviations: Step-through pattern;Decreased stride length Gait velocity: decreased   General Gait Details: decreased distancve due to dyspnea.  RA started at 1005 and decreased to 91% with 2/4 dyspnea.  "that's about how I walk"   Stairs             Wheelchair Mobility    Modified Rankin (Stroke Patients Only)       Balance                                             Cognition Arousal/Alertness: Awake/alert Behavior During Therapy: WFL for tasks assessed/performed Overall Cognitive Status: Within Functional Limits for tasks assessed                                 General Comments: AxO x 3 pleasant      Exercises      General Comments        Pertinent Vitals/Pain Pain Assessment: No/denies pain    Home Living                      Prior Function            PT Goals (current goals can now be found in the care plan section) Progress towards PT goals: Progressing toward goals    Frequency    Min 3X/week      PT Plan Current plan remains appropriate    Co-evaluation              AM-PAC PT "6 Clicks" Mobility   Outcome Measure  Help needed turning from your back to your side while in a flat bed without using bedrails?:  A Little Help needed moving from lying on your back to sitting on the side of a flat bed without using bedrails?: A Little Help needed moving to and from a bed to a chair (including a wheelchair)?: A Little Help needed standing up from a chair using your arms (e.g., wheelchair or bedside chair)?: A Little Help needed to walk in hospital room?: A Little Help needed climbing 3-5 steps with a railing? : A Little 6 Click Score: 18    End of Session Equipment Utilized During Treatment: Gait belt Activity Tolerance: Patient tolerated treatment well Patient left: in chair;with bed alarm set;with family/visitor present Nurse Communication: Mobility status PT Visit Diagnosis: Muscle weakness (generalized) (M62.81);Other abnormalities of gait and mobility (R26.89);Unsteadiness on feet (R26.81);Difficulty in walking, not elsewhere classified (R26.2)     Time: 8937-3428 PT Time Calculation (min) (ACUTE ONLY): 17 min  Charges:  $Gait Training: 8-22 mins                     Rica Koyanagi  PTA Acute  Rehabilitation Services Pager       [phone] Office      [phone]

## 2020-10-07 NOTE — Procedures (Signed)
Interventional Radiology Procedure Note  Procedure: Single Lumen Power Port Placement    Access:  Right internal jugular vein  Findings: Catheter tip positioned at cavoatrial junction. Port is ready for immediate use.   Complications: None  EBL: < 10 mL  Recommendations:  - Ok to shower in 24 hours - Do not submerge for 7 days - Routine line care    Ruthann Cancer, MD Pager: [phone]

## 2020-10-08 DIAGNOSIS — I350 Nonrheumatic aortic (valve) stenosis: Secondary | ICD-10-CM | POA: Diagnosis not present

## 2020-10-08 DIAGNOSIS — N170 Acute kidney failure with tubular necrosis: Secondary | ICD-10-CM

## 2020-10-08 DIAGNOSIS — C3491 Malignant neoplasm of unspecified part of right bronchus or lung: Secondary | ICD-10-CM | POA: Diagnosis not present

## 2020-10-08 DIAGNOSIS — I2510 Atherosclerotic heart disease of native coronary artery without angina pectoris: Secondary | ICD-10-CM | POA: Diagnosis not present

## 2020-10-08 LAB — COMPREHENSIVE METABOLIC PANEL
ALT: 16 U/L (ref 0–44)
AST: 19 U/L (ref 15–41)
Albumin: 2.6 g/dL — ABNORMAL LOW (ref 3.5–5.0)
Alkaline Phosphatase: 66 U/L (ref 38–126)
Anion gap: 9 (ref 5–15)
BUN: 25 mg/dL — ABNORMAL HIGH (ref 8–23)
CO2: 25 mmol/L (ref 22–32)
Calcium: 7.9 mg/dL — ABNORMAL LOW (ref 8.9–10.3)
Chloride: 110 mmol/L (ref 98–111)
Creatinine, Ser: 1.17 mg/dL (ref 0.61–1.24)
GFR, Estimated: >60 mL/min (ref >60)
Glucose, Bld: 78 mg/dL (ref 70–99)
Potassium: 3.9 mmol/L (ref 3.5–5.1)
Sodium: 144 mmol/L (ref 135–145)
Total Bilirubin: 0.4 mg/dL (ref 0.3–1.2)
Total Protein: 5.2 g/dL — ABNORMAL LOW (ref 6.5–8.1)

## 2020-10-08 LAB — CBC WITH DIFFERENTIAL/PLATELET
Abs Immature Granulocytes: 0.02 10*3/uL (ref 0.00–0.07)
Basophils Absolute: 0 10*3/uL (ref 0.0–0.1)
Basophils Relative: 2 %
Eosinophils Absolute: 0 10*3/uL (ref 0.0–0.5)
Eosinophils Relative: 1 %
HCT: 24.9 % — ABNORMAL LOW (ref 39.0–52.0)
Hemoglobin: 7.9 g/dL — ABNORMAL LOW (ref 13.0–17.0)
Immature Granulocytes: 1 %
Lymphocytes Relative: 36 %
Lymphs Abs: 0.7 10*3/uL (ref 0.7–4.0)
MCH: 29.6 pg (ref 26.0–34.0)
MCHC: 31.7 g/dL (ref 30.0–36.0)
MCV: 93.3 fL (ref 80.0–100.0)
Monocytes Absolute: 0.1 10*3/uL (ref 0.1–1.0)
Monocytes Relative: 3 %
Neutro Abs: 1.2 10*3/uL — ABNORMAL LOW (ref 1.7–7.7)
Neutrophils Relative %: 57 %
Platelets: 96 10*3/uL — ABNORMAL LOW (ref 150–400)
RBC: 2.67 MIL/uL — ABNORMAL LOW (ref 4.22–5.81)
RDW: 14.6 % (ref 11.5–15.5)
WBC: 2.1 10*3/uL — ABNORMAL LOW (ref 4.0–10.5)
nRBC: 0 % (ref 0.0–0.2)

## 2020-10-08 LAB — GLUCOSE, CAPILLARY
Glucose-Capillary: 53 mg/dL — ABNORMAL LOW (ref 70–99)
Glucose-Capillary: 53 mg/dL — ABNORMAL LOW (ref 70–99)
Glucose-Capillary: 55 mg/dL — ABNORMAL LOW (ref 70–99)
Glucose-Capillary: 119 mg/dL — ABNORMAL HIGH (ref 70–99)
Glucose-Capillary: 226 mg/dL — ABNORMAL HIGH (ref 70–99)

## 2020-10-08 LAB — MAGNESIUM: Magnesium: 2 mg/dL (ref 1.7–2.4)

## 2020-10-08 LAB — PHOSPHORUS: Phosphorus: 3.5 mg/dL (ref 2.5–4.6)

## 2020-10-08 MED ORDER — LISINOPRIL 10 MG PO TABS: 10.0000 mg | ORAL_TABLET | Freq: Every day | ORAL | Status: DC

## 2020-10-08 MED ORDER — APIXABAN 5 MG PO TABS: 5.0000 mg | ORAL_TABLET | Freq: Two times a day (BID) | ORAL | Status: DC

## 2020-10-08 MED ORDER — PANTOPRAZOLE SODIUM 40 MG PO TBEC
40.0000 mg | DELAYED_RELEASE_TABLET | Freq: Two times a day (BID) | ORAL | 0 refills | Status: DC
Start: 2020-10-08 — End: 2021-01-13

## 2020-10-08 MED ORDER — FUROSEMIDE 40 MG PO TABS: 80.0000 mg | ORAL_TABLET | Freq: Every morning | ORAL | Status: DC

## 2020-10-08 MED ORDER — TRESIBA FLEXTOUCH 200 UNIT/ML ~~LOC~~ SOPN: 24.0000 [IU] | PEN_INJECTOR | Freq: Every day | SUBCUTANEOUS | Status: DC

## 2020-10-08 MED ORDER — GLUCERNA SHAKE PO LIQD
237.0000 mL | Freq: Three times a day (TID) | ORAL | 0 refills | Status: DC
Start: 2020-10-08 — End: 2021-01-13

## 2020-10-08 MED ORDER — INSULIN GLARGINE 100 UNIT/ML ~~LOC~~ SOLN: 24.0000 [IU] | Freq: Every day | SUBCUTANEOUS | Status: DC

## 2020-10-08 MED ORDER — ONDANSETRON HCL 4 MG PO TABS: 4.0000 mg | ORAL_TABLET | Freq: Four times a day (QID) | ORAL | 0 refills | Status: DC | PRN

## 2020-10-08 NOTE — Discharge Summary (Signed)
Physician Discharge Summary  [name]:[id number] DOB: Jun 26, 1941 DOA: 10/02/2020  PCP: [name], MD  Admit date: 10/02/2020 Discharge date: 10/08/2020  Admitted From: Home Disposition: Home with Home Health  Recommendations for Outpatient Follow-up:  1. Follow up with PCP in 1-2 weeks 2. Follow up with Oncology within 1 week 3. Follow up with Cardiology within 1 week 4. Follow up with Gastroenterology within 2-4 weeks  5. Please obtain CMP/CBC, Mag, Phos in one week 6. Please follow up on the following pending results:  Home Health: YES  Equipment/Devices: None   Discharge Condition: Stable CODE STATUS: FULL CODE Diet recommendation: Heart Healthy Carb Modified Diet    Brief/Interim Summary: HPI per Dr. Gala Romney on 10/02/20 Randall Wilkins a 79 y.o.malewith medical history significant ofhypertension, diabetes, aortic stenosis, hypothyroidism, coronary artery disease status post PCI, chronic anticoagulation and stage IV lung cancer currently on chemo whose last chemo was 2 days ago that came to the ER with weakness shortness of breath as well as nausea. Patient did get his chemo on Monday was feeling better at that time. He went home but then started feeling weak and tired. This morning he woke up with significant nausea. He took some nausea medication and then suddenly became weak debilitated and not himself. He was slightly confused so wife brought him to the ER where he was evaluated. He was found to be significantly dehydrated. Has acute on chronic kidney disease as well as elevated blood sugar. Patient therefore being admitted to the hospital for evaluation.  ED Course:Temperature 98.6 blood pressure 130/54 pulse 90 respirate of 20 oxygen sat 90% on room air. White count is 14 point hemoglobin 8.4 platelet count of 292. Chemistry showed BUN of 82 creatinine 1.70 and calcium 8.5. Glucose is 247. Troponin is 55 and 52. COVID-19 screen is  negative. Chest x-ray shows bilateral masses consistent with lung cancer. Patient being admitted for further evaluation and treatment.  **Interim History  Patient states he is feeling a little bit better today but his renal function still elevated. We will continue him fluid hydration and obtain a PT and OT evaluation given his weakness.  10/04/20: Overnight patient had dark black tarry stools and his hemoglobin further dropped and is 6.6 today.  Initially thought that he had anemia due to his chemotherapy but now feels like he is actively having a GI bleed.  We will type and screen and transfuse 1 unit of PRBCs and place the patient on Protonix drip and hold his Eliquis and aspirin.  GI has been consulted and FOBT has been ordered.  GI planning on doing EGD tomorrow given the patient ate breakfast today and because there is a lack of anesthesia availability in the endoscopy unit today.  Patient is to be n.p.o. after midnight and will be placed on a clear liquid diet today with his Eliquis being held.  10/05/20: Did not have any more bleeding overnight per patient.  He is to undergo endoscopy later this afternoon and his sodium and chloride was elevated so we will start gentle IV fluid hydration with D5W to bring this down.  His blood count has adequately responded to the 2 units of transfusion and repeat this morning was 9.5/29.3.  Renal function is improved as well and will discuss with surgery about possibly getting a port while he is in-house.  10/06/20: EGD done and bleeding was thought to be from esophagitis. He is on a PPI and GI recommending transitioning to twice daily PPI  and advancing diet. Recommendation is to hold Eliquis for least a week and then resume. Will consult interventional radiology for likely port placement and patient to be n.p.o. at midnight. Overnight he was hypoglycemic and this morning had to be given D50 amp and insulin has been adjusted and reduced.  10/07/2020:  Patient is to have a port placement today and PT recommending home health PT.  10/08/2020: Patient is stable to be discharged and he is improved.  No further bleeding episodes.  We have resumed his Lasix.  He did have a pancytopenia but this is stable and likely in the setting of chemotherapy.  Had no active bleeding and no signs of infection.  Oncology will follow up with him repeat blood work within the week.  We will need to follow-up with PCP, oncology, cardiology as well as gastroenterology and he understands agrees with plan of care  Discharge Diagnoses:  Active Problems:   Diabetes 1.5, managed as type 1 ([place])   CAD S/P percutaneous coronary angioplasty   Aortic valve stenosis   Acute renal failure (HCC)   Chronic diastolic congestive heart failure (HCC)   Chronic kidney disease (CKD), stage III (moderate) (HCC)   Essential hypertension   Adenocarcinoma of right lung, stage 4 (HCC)   ARF (acute renal failure) (HCC)  Acute on chronic kidney disease stage IIIa, improving and is thought to be dehydration but now likely in setting of GI bleeding and diuretic use, improved and stabilized -Patient has known history of chronic kidney disease stage IIIa.  -Currently dehydrated as a result of chemo and diuretics. -Will need to Clarify Diuretics as he has Furosemide and Torsemide listed both on his MAR; see below -Appears Euvolemic now  -BUN/creatinine on 09/30/2020 was 15/1.38 and slightly worsened to 82/1.70; now slowly improving and went to 78/1.61 -> is 59/1.42 -> 39/1.17 and is now 29/1.14 yesterday and is now 28/1.16 -IVF now stopped  -Avoid nephrotoxic medications and currently holding his furosemide/torsemide, lisinopril, allopurinol, Metformin, and tamsulosin; Will resume Tamsulosin today, Lisinopril in the AM and Furosemide likely at D/C -Avoid contrast dyes, and renally adjust medications -Continue to monitor and trend renal function panel and repeat CMP within 1  week  Dehydration, improved -Initially started on aggressive hydration and will hold his furosemide/torsemide; apparently the patient has been taking 80 mg of furosemide in the morning and 10 mg of torsemide in the evening without patient's PCP not.  He is only prescribed furosemide 80 mg p.o. 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 daily before breakfast  History of gout -Currently holding allopurinol 300 mg p.o. daily given his worsened renal function  Chronic Diastolic CHF  -BNP on admission was 344.4 -Currently appears dehydrated  and was given IV fluid resuscitation and have change the rate from 125 MLS per hour to 75 MLS per hour for 1 day and this is now been stopped and he is getting a unit of blood yesterday; fluids have been stopped but will resume today D5W -We will hold his furosemide and discontinue his torsemide altogether; Resume Furosemide this a.m. -Continue with metoprolol succinate 25 mg p.o. daily and will hold his lisinopril 10 mg p.o. daily for now -Strict I's and O's and daily weights; is -254.1 mL since admission and weight is up 1 pound since admission -We will continue to monitor for signs and symptoms of  volume overload -Follow-up with cardiology in outpatient setting  Insulin-Dependent Diabetes Mellitus Type 2 complicated by Hypoglycemia -Resume home Tresiba 46 units SQ nightly and hold his home Metformin and Trulicity as well as a sliding scale insulin; we will reduce the Lantus to 32 units and further reduced to 24 units -Resume home Trulicity and Metformin at discharge and 24 units of Tresiba -We will start on a moderate NovoLog/scale insulin before meals and at bedtime I reduce to sensitive NovoLog sign scale AC -Continue to monitor CBGs carefully is ranging from  53-226 -We will start D5W given his hypernatremia and hyperchloremia -As hemoglobin A1c was 6.4 on 05/20/2020 -We'll need to watch his blood sugars carefully given his hypoglycemia -Advance diet as tolerated  History of CAD status post CABG x2 -We will hold his aspirin 81 mg daily due to his GI bleeding and will continue metoprolol succinate 25 mg p.o. daily, and will hold his lisinopril 10 mg daily and resume in the AM  -resume rosuvastatin 20 mg nightly -Currently denies any chest pain  History of left parietal CVA with small lacunar infarcts -Discontinued aspirin 81 mg p.o. daily as well as apixaban 5 milligrams p.o. twice daily for now given his GI bleeding but will continue simvastatin 20 once daily; Resume Apxiaban 5 mg BID on October 12, 2020  Paroxysmal Atrial Fibrillation (New Onset Earlier this year) -CHA2DS2-VASc score of 6 -We will hold apixaban 5 mg p.o. twice daily given that he is having a GI bleed now despite having history of CVA; -Continue telemetry Monitoring -Continue with metoprolol succinate 25 mg daily  Acquired Thrombophilia -In the setting of atrial fibrillation -CHA2DS2-VASc score is 6 -Continue to hold Eliquis and resume after a week per GI recommendations; resume Eliquis on October 12, 2020  Dyslipidemia/Hyperlipidemia -Continue Rosuvastatin 20 mg daily  BPH -Will resume Tamsulosin  0.4 mg p.o. daily  Anorexia -Patient has significant anorexia and nausea.  -Symptomatically treat. -We will consult nutritionist for further evaluation recommendation  Hypernatremia/Hyperchloremia -Patient sodium trended up to 147 his chloride level of 112; Now Na+ is 144 and Chloride is 110 -IVF with D5W Stopped -Continue monitor and repeat CMP in a.m.  Obesity -Complicates overall prognosis and care -Estimated body mass index is 37.84 kg/m as calculated from the following:   Height as of this encounter: $RemoveBeforeD'5\' 7"'[id number]$  (1.702 m).   Weight as of this encounter: 109.6 kg. -Weight Loss and Dietary Counseling given   OSA -C/w CPAP nightly   Normocytic Anemia/Anemia secondary to Antineoplastic therapy/acute blood loss anemia GI Bleeding in the setting of esophagitis -Patient's hemoglobin/hematocrit on admission was 8.4/26.0 and then trended down to 7.5/23.0 ->and -> 6.6/20.2 -Checked anemia panel and showed an iron level of 192, TIBC of 17, TIBC of 209, saturation ratios of 92%, ferritin level 14, folate levels 22.3, and vitamin B12 of 3201 -Initially  thought to be from antineoplastic therapy and dilutional drop however patient was having significant melanotic and dark tarry stools overnight -Because his acute drop in hemoglobin to 6.0 we will type and screen and transfuse 2 unit PRBCs -We will hold his Eliquis and his aspirin; GI recommending holding Eliquis for another week if possible and then resuming -We will start 2 large-bore IVs -BID PPI with Pantorprazole 40 mg po x6 weeks and then po 40 mg Daily indefinitely per GI recc's -We will obtain FOBT; this is pending to be done -Hemoglobin/hematocrit after blood transfusions trended up to 8.6/26.2 yesterday morning and then repeat this morning was also eight 9.5/29.3 -> 9.4/28.8 -> 8.6/26.6 -> and is stable at 8.5/26.5 yesterday and today is 7.9/24.9 -GI consulted for further evaluation recommendations and because the patient ate today  and because there is no availability endoscopy unit today his EGD done yesterday afternoon and showed "LA Grade A esophagitis. Erosive gastropathy with no stigmata of recentbleeding. Bulbar duodenitis. Acquired duodenal stenosis. Likely peptic stricture from prior episode of duodenal ulcer disease noted on EGD in 2016. Overall bleeding most likely from esophagitis." -Continue to monitor for signs and symptoms of bleeding; no longer having the melanotic and dark tarry stools as he states this is improved -Diet is being advanced and patient will need to follow-up with Dr. Watt Climes in the outpatient setting -Repeat CBC within a week  Esophagitis -On PPI; Please See GI recc's and will need to be on it BID  Leukocytosis, improved -Likely hemoconcentration in the setting of dehydration  -WBC on admission was 14.2 ->10.1 -> 6.0 -> 7.0 -> 6.7 and dropped to 2.1 today -Continue to monitor for signs and symptoms of infection  -repeat CBC within 1 week  Pancytopenia -In the setting of his recent chemo -I spoke with the oncology PA who feels that he is still stable to be discharged at their way to get lab work this week and the patient on Monday. -Oncology follow-up with them within a week -Continue to monitor for signs and symptoms of infection and there is none; no more overt bleeding noted.  Generalized Weakness -In the setting of dehydration and acute blood loss anemia -PT/OT to Evaluate and Treat and recommending Home Health PT now  Discharge Instructions  Discharge Instructions    (HEART FAILURE PATIENTS) Call MD:  Anytime you have any of the following symptoms: 1) 3 pound weight gain in 24 hours or 5 pounds in 1 week 2) shortness of breath, with or without a dry hacking cough 3) swelling in the hands, feet or stomach 4) if you have to sleep on extra pillows at night in order to breathe.   Complete by: As directed    Call MD for:  difficulty breathing, headache or visual disturbances    Complete by: As directed    Call MD for:  extreme fatigue   Complete by: As directed    Call MD for:  hives   Complete by: As directed    Call MD for:  persistant dizziness or light-headedness   Complete by: As directed    Call MD for:  persistant nausea and vomiting   Complete by: As directed    Call MD for:  redness, tenderness, or signs of infection (pain, swelling, redness, odor or green/yellow discharge around incision site)   Complete by: As directed    Call MD for:  severe uncontrolled pain   Complete by: As directed    Diet - low sodium heart healthy  Complete by: As directed    Diet Carb Modified   Complete by: As directed    Discharge instructions   Complete by: As directed    You were cared for by a hospitalist during your hospital stay. If you have any questions about your discharge medications or the care you received while you were in the hospital after you are discharged, you can call the unit and ask to speak with the hospitalist on call if the hospitalist that took care of you is not available. Once you are discharged, your primary care physician will handle any further medical issues. Please note that NO REFILLS for any discharge medications will be authorized once you are discharged, as it is imperative that you return to your primary care physician (or establish a relationship with a primary care physician if you do not have one) for your aftercare needs so that they can reassess your need for medications and monitor your lab values.  Follow up with PCP, Cardiology, Gastroenterology, and Oncology within 1 week. Take all medications as prescribed. If symptoms change or worsen please return to the ED for evaluation   Discharge wound care:   Complete by: As directed    Per IR reccs   Increase activity slowly   Complete by: As directed      Allergies as of 10/08/2020      Reactions   Actos [pioglitazone] Swelling   Metformin And Related Nausea Only   Niaspan [niacin  Er] Itching, Rash      Medication List    STOP taking these medications   aspirin EC 81 MG tablet   torsemide 10 MG tablet Commonly known as: DEMADEX     TAKE these medications   albuterol 108 (90 Base) MCG/ACT inhaler Commonly known as: VENTOLIN HFA Inhale 2 puffs into the lungs every 6 (six) hours as needed for wheezing or shortness of breath.   allopurinol 300 MG tablet Commonly known as: ZYLOPRIM Take 300 mg by mouth daily.   amitriptyline 50 MG tablet Commonly known as: ELAVIL Take 75 mg by mouth at bedtime.   apixaban 5 MG Tabs tablet Commonly known as: ELIQUIS Take 1 tablet (5 mg total) by mouth 2 (two) times daily. Start taking on: October 12, 2020 What changed:   additional instructions  These instructions start on October 12, 2020. If you are unsure what to do until then, ask your doctor or other care provider.   Contour Next Monitor w/Device Kit 1 Device by Does not apply route 3 (three) times daily. Use to check blood sugars 3 times daily. Dx Code E13.9   Contour Next Test test strip Generic drug: glucose blood USE 1 STRIP TO CHECK GLUCOSE 4 TIMES DAILY   donepezil 10 MG tablet Commonly known as: ARICEPT TAKE ONE TABLET DAILY.  PLEASE CALL [phone] TO SCHEDULE FOLLOW UP. What changed:   how much to take  how to take this  when to take this  additional instructions   Easy Comfort Pen Needles 33G X 4 MM Misc Generic drug: Insulin Pen Needle 1 each by Does not apply route See admin instructions. Use to inject insulin 5 times daily.   feeding supplement (GLUCERNA SHAKE) Liqd Take 237 mLs by mouth 3 (three) times daily between meals.   folic acid 1 MG tablet Commonly known as: FOLVITE Take 1 tablet (1 mg total) by mouth daily.   furosemide 40 MG tablet Commonly known as: LASIX Take 80 mg by mouth in the morning.  HumaLOG 100 UNIT/ML cartridge Generic drug: insulin lispro Inject 5-15 Units into the skin 3 (three) times daily with  meals. Sliding scale.   levothyroxine 75 MCG tablet Commonly known as: SYNTHROID Take 75 mcg by mouth daily before breakfast.   lidocaine 2 % solution Commonly known as: XYLOCAINE Use as directed 15 mLs in the mouth or throat as needed for mouth pain.   lidocaine-prilocaine cream Commonly known as: EMLA Apply 1 application topically as needed. Apply a teaspoon over port site at least 1 hour prior to lab appt. Do not rub in and cover with plastic wrap.   lisinopril 10 MG tablet Commonly known as: ZESTRIL TAKE ONE TABLET DAILY   metFORMIN 750 MG 24 hr tablet Commonly known as: GLUCOPHAGE-XR TAKE 1 TABLET WITH DINNER What changed: See the new instructions.   metoprolol succinate 25 MG 24 hr tablet Commonly known as: TOPROL-XL Take 1 tablet (25 mg total) by mouth daily.   NON FORMULARY Inhale 1 application into the lungs at bedtime. CPAP   ondansetron 4 MG tablet Commonly known as: ZOFRAN Take 1 tablet (4 mg total) by mouth every 6 (six) hours as needed for nausea.   pantoprazole 40 MG tablet Commonly known as: PROTONIX Take 1 tablet (40 mg total) by mouth 2 (two) times daily before a meal.   potassium chloride SA 20 MEQ tablet Commonly known as: KLOR-CON Take 20 mEq by mouth 2 (two) times daily.   prochlorperazine 10 MG tablet Commonly known as: COMPAZINE Take 1 tablet (10 mg total) by mouth every 6 (six) hours as needed for nausea or vomiting.   rosuvastatin 20 MG tablet Commonly known as: CRESTOR Take 20 mg by mouth daily.   tamsulosin 0.4 MG Caps capsule Commonly known as: FLOMAX Take 0.4 mg by mouth daily.   Tyler Aas FlexTouch 200 UNIT/ML FlexTouch Pen Generic drug: insulin degludec Inject 24 Units into the skin at bedtime. What changed: how much to take   Victoza 18 MG/3ML Sopn Generic drug: liraglutide Inject 1.2 mg into the skin daily.   vitamin B-12 1000 MCG tablet Commonly known as: CYANOCOBALAMIN Take 1,000 mcg by mouth daily.             Discharge Care Instructions  (From admission, onward)         Start     Ordered   10/08/20 0000  Discharge wound care:       Comments: Per IR reccs   10/08/20 [place], [place] Follow up.   Why: agency will provide home health therapy. Contact information: [address] [place] [address] [phone]              Allergies  Allergen Reactions  . Actos [Pioglitazone] Swelling  . Metformin And Related Nausea Only  . Niaspan [Niacin Er] Itching and Rash   Consultations: Gastroenterology Notified Oncology via epic IR for port Placement   Procedures/Studies: CT ABDOMEN PELVIS W CONTRAST  Result Date: 09/19/2020 CLINICAL DATA:  History of non-small fall lung cancer, epigastric abdominal pain EXAM: CT ABDOMEN AND PELVIS WITH CONTRAST TECHNIQUE: Multidetector CT imaging of the abdomen and pelvis was performed using the standard protocol following bolus administration of intravenous contrast. CONTRAST:  19mL OMNIPAQUE IOHEXOL 300 MG/ML  SOLN COMPARISON:  Head CT August 22, 2020 FINDINGS: Lower chest: There is mild cardiomegaly. Aortic atherosclerosis seen. Again noted is a partially visualized rounded airspace consolidation/mass in  the posterior right lower lobe and left infrahilar region. A trace right pleural effusion is present. There is also a 9 mm posterior left lower lobe nodule which was seen on the prior exam. Hepatobiliary: The liver is normal in density without focal abnormality.The main portal vein is patent. The patient is status post cholecystectomy. No biliary ductal dilation. Pancreas: There is mild inflammatory changes which there seen adjacent to the pancreatic head which could be from the duodenum. No pancreatic ductal dilatation, or fluid collections. Spleen: Normal in size without focal abnormality. Adrenals/Urinary Tract: Both adrenal glands appear normal. A 3 cm low-density lesion seen  off lower pole of the right kidney. Bladder is unremarkable. Stomach/Bowel: A small hiatal hernia is present. The stomach is otherwise unremarkable. There is diffuse wall thickening seen throughout the duodenum with hyperenhancement and surrounding mesenteric fat stranding changes. A possible focal diverticulum is within the second portion of the duodenum. No definite loculated fluid collections or free air however are noted. Inflammatory changes are seen tracking into the right pericolic gutter. The remainder of the small bowel is unremarkable. There is a moderate amount of stool however it is unremarkable. The appendix is unremarkable Vascular/Lymphatic: There are no enlarged mesenteric, retroperitoneal, or pelvic lymph nodes. Scattered aortic atherosclerotic calcifications are seen without aneurysmal dilatation. Reproductive: The prostate is unremarkable. Other: No evidence of abdominal wall mass or hernia. Musculoskeletal: No acute or significant osseous findings. Again noted is a lobular soft tissue mass seen overlying the left gluteal soft tissues with internal rounded calcifications. This may be due to prior injury. IMPRESSION: Findings suggestive of extensive acute duodenitis which may be due to infectious or inflammatory process. Probable duodenal diverticulum is noted however no loculated fluid collections or free air. Mild inflammatory changes around the pancreatic head which may be due to the adjacent duodenum versus concomitant mild pancreatitis. Rounded areas of masslike consolidation in the bilateral lungs as on the recent PET CT August 22, 2020. Stable 9 mm pulmonary nodule in the posterior left lower lung Aortic Atherosclerosis (ICD10-I70.0). Electronically Signed   By: Prudencio Pair M.D.   On: 09/19/2020 22:23   DG Chest Portable 1 View  Result Date: 10/02/2020 CLINICAL DATA:  History of known lung cancer with weakness, initial encounter EXAM: PORTABLE CHEST 1 VIEW COMPARISON:  PET-CT from  08/22/2020 FINDINGS: Cardiac shadow is stable. Postsurgical changes are again seen. Bilateral soft tissue lung masses are noted consistent with the given clinical history. Elevation the right hemidiaphragm is again noted. No sizable effusion or pneumothorax is seen. No acute bony abnormality is noted. IMPRESSION: Bilateral masses consistent with the given clinical history of lung carcinoma. No acute abnormality noted. Electronically Signed   By: Inez Catalina M.D.   On: 10/02/2020 17:51   IR IMAGING GUIDED PORT INSERTION  Result Date: 10/07/2020 INDICATION: 79 year old male with advanced stage lung cancer requiring central venous access for chemotherapy. EXAM: IMPLANTED PORT A CATH PLACEMENT WITH ULTRASOUND AND FLUOROSCOPIC GUIDANCE COMPARISON:  PET-CT from 08/22/2020 MEDICATIONS: Ancef 2 gm IV; The antibiotic was administered within an appropriate time interval prior to skin puncture. ANESTHESIA/SEDATION: Moderate (conscious) sedation was employed during this procedure. A total of Versed 2 mg and Fentanyl 100 mcg was administered intravenously. Moderate Sedation Time: 20 minutes. The patient's level of consciousness and vital signs were monitored continuously by radiology nursing throughout the procedure under my direct supervision. CONTRAST:  None FLUOROSCOPY TIME:  0 minutes, 36 seconds (18 mGy) COMPLICATIONS: None immediate. PROCEDURE: The procedure, risks, benefits, and alternatives were  explained to the patient. Questions regarding the procedure were encouraged and answered. The patient understands and consents to the procedure. The right neck and chest were prepped with chlorhexidine in a sterile fashion, and a sterile drape was applied covering the operative field. Maximum barrier sterile technique with sterile gowns and gloves were used for the procedure. A timeout was performed prior to the initiation of the procedure. Ultrasound was used to examine the jugular vein which was compressible and free of  internal echoes. A skin marker was used to demarcate the planned venotomy and port pocket incision sites. Local anesthesia was provided to these sites and the subcutaneous tunnel track with 1% lidocaine with 1:100,000 epinephrine. A small incision was created at the jugular access site and blunt dissection was performed of the subcutaneous tissues. Under real time ultrasound guidance, the jugular vein was accessed with a 21 ga micropuncture needle and an 0.018" wire was inserted to the superior vena cava. A 5 Fr micopuncture set was then used, through which a 0.035" Rosen wire was passed under fluoroscopic guidance into the inferior vena cava. An 8 Fr dilator was then placed over the wire. A subcutaneous port pocket was then created along the upper chest wall utilizing a combination of sharp and blunt dissection. The pocket was irrigated with sterile saline, packed with gauze, and observed for hemorrhage. A single lumen "ISP" sized power injectable port was chosen for placement. The 8 Fr catheter was tunneled from the port pocket site to the venotomy incision. The port was placed in the pocket. The external catheter was trimmed to appropriate length. The dilator was exchanged for an 8 Fr peel-away sheath under fluoroscopic guidance. The catheter was then placed through the sheath and the sheath was removed. Final catheter positioning was confirmed and documented with a fluoroscopic spot radiograph. The port was accessed with a Huber needle, aspirated, and flushed with heparinized saline. The deep dermal layer of the port pocket incision was closed with interrupted 3-0 Vicryl suture. Dermabond was then placed over the port pocket and neck incisions. The patient tolerated the procedure well without immediate post procedural complication. FINDINGS: After catheter placement, the tip lies within the cavoatrial junction the catheter aspirates and flushes normally and is ready for immediate use. IMPRESSION: Successful  placement of a power injectable Port-A-Cath via the right internal jugular vein. The catheter is ready for immediate use. Marliss Coots, MD [facility] Radiology Electronically Signed   By: Marliss Coots MD   On: 10/07/2020 16:26     EGD  Findings: A small hiatal hernia was present. One benign-appearing, intrinsic mild stenosis was found. The stenosis  was traversed. LA Grade A (one or more mucosal breaks less than 5 mm, not extending  between tops of 2 mucosal folds) esophagitis was found. The exam of the esophagus was otherwise normal. A few dispersed small erosions with no stigmata of recent bleeding were  found in the gastric body and in the gastric antrum. The exam of the stomach was otherwise normal. Mild inflammation was found in the duodenal bulb. An acquired moderate stenosis was found in the second portion of the  duodenum and was non-traversed. No old or fresh blood was seen to the extent of our examination. Impression: - Small hiatal hernia. - Benign-appearing esophageal stenosis. - LA Grade A esophagitis. - Erosive gastropathy with no stigmata of recent  bleeding. - Bulbar duodenitis. - Acquired duodenal stenosis. Likely peptic  stricture from prior episode of duodenal ulcer  disease noted on EGD in 2016. - Overall bleeding most likely from esophagitis. Moderate Sedation: None Recommendation: - Return patient to hospital ward for ongoing care. - Clear liquid diet today. - Continue present medications, including PPI.  Subjective: Seen and examined at  bedside and he is doing much better and had no complaints.  Felt well and denied any bleeding.  No nausea or vomiting.  Will resume his Lasix and he understands agrees with the plan of care and understands that he is to resume his apixaban on October 12, 2020.  He only to follow-up with PCP and cardiology outpatient setting and follow-up with oncology within a week and have repeat blood work done.  He is stable to be discharged now and wife is in agreement with the plan of care and all questions were answered to their satisfaction.  Discharge Exam: Vitals:   10/08/20 0154 10/08/20 0619  BP: (!) 139/59 (!) 120/52  Pulse: 72 74  Resp: 18 18  Temp: (!) 97.4 F (36.3 C) 97.9 F (36.6 C)  SpO2: 100% 95%   Vitals:   10/07/20 1625 10/07/20 2027 10/08/20 0154 10/08/20 0619  BP: (!) 167/84 129/72 (!) 139/59 (!) 120/52  Pulse: 81 79 72 74  Resp: $Remo'16 18 18 18  'IsFBP$ Temp: 98.4 F (36.9 C) 98 F (36.7 C) (!) 97.4 F (36.3 C) 97.9 F (36.6 C)  TempSrc: Oral Oral Oral Oral  SpO2: 100% 96% 100% 95%  Weight:      Height:       General: Pt is alert, awake, not in acute distress Cardiovascular: RRR, S1/S2 +, no rubs, no gallops Respiratory: CTA bilaterally diminished, no wheezing, no rhonchi; unlabored breathing Abdominal: Soft, NT, distended secondary by habitus, bowel sounds + Extremities: 1+ lower extremity edema, no cyanosis; has lower extremity venous stasis changes with hemosiderin deposition  The results of significant diagnostics from this hospitalization (including imaging, microbiology, ancillary and laboratory) are listed below for reference.    Microbiology: Recent Results (from the past 240 hour(s))  Respiratory Panel by RT PCR (Flu A&B, Covid) - Nasopharyngeal Swab     Status: None   Collection Time: 10/02/20  5:57 PM   Specimen: Nasopharyngeal Swab  Result Value Ref Range Status   SARS Coronavirus 2 by RT PCR NEGATIVE NEGATIVE Final    Comment: (NOTE) SARS-CoV-2 target nucleic acids  are NOT DETECTED.  The SARS-CoV-2 RNA is generally detectable in upper respiratoy specimens during the acute phase of infection. The lowest concentration of SARS-CoV-2 viral copies this assay can detect is 131 copies/mL. A negative result does not preclude SARS-Cov-2 infection and should not be used as the sole basis for treatment or other patient management decisions. A negative result may occur with  improper specimen collection/handling, submission of specimen other than nasopharyngeal swab, presence of viral mutation(s) within the areas targeted by this assay, and inadequate number of viral copies (<131 copies/mL). A negative result must be combined with clinical observations, patient history, and epidemiological information. The expected result is Negative.  Fact Sheet for Patients:  PinkCheek.be  Fact Sheet for Healthcare Providers:  GravelBags.it  This test is no t yet approved or cleared by the [place] FDA and  has been authorized for detection and/or diagnosis of SARS-CoV-2 by FDA under an Emergency Use Authorization (EUA). This EUA will remain  in effect (meaning this test can be used) for the duration of the COVID-19 declaration under Section 564(b)(1) of the Act, 21 U.S.C. section 360bbb-3(b)(1), unless the authorization is  terminated or revoked sooner.     Influenza A by PCR NEGATIVE NEGATIVE Final   Influenza B by PCR NEGATIVE NEGATIVE Final    Comment: (NOTE) The Xpert Xpress SARS-CoV-2/FLU/RSV assay is intended as an aid in  the diagnosis of influenza from Nasopharyngeal swab specimens and  should not be used as a sole basis for treatment. Nasal washings and  aspirates are unacceptable for Xpert Xpress SARS-CoV-2/FLU/RSV  testing.  Fact Sheet for Patients: PinkCheek.be  Fact Sheet for Healthcare Providers: GravelBags.it  This test is not  yet approved or cleared by the [place] FDA and  has been authorized for detection and/or diagnosis of SARS-CoV-2 by  FDA under an Emergency Use Authorization (EUA). This EUA will remain  in effect (meaning this test can be used) for the duration of the  Covid-19 declaration under Section 564(b)(1) of the Act, 21  U.S.C. section 360bbb-3(b)(1), unless the authorization is  terminated or revoked. Performed at [facility], [place] [address], [place], [place] [address]     Labs: BNP (last 3 results) Recent Labs    10/02/20 1747  BNP 812.7*   Basic Metabolic Panel: Recent Labs  Lab 10/04/20 0604 10/05/20 0309 10/06/20 0657 10/07/20 0559 10/08/20 0453  NA 140 147* 143 143 144  K 4.1 4.2 4.0 4.9 3.9  CL 108 112* 109 110 110  CO2 $Re'25 28 26 25 25  'CTl$ GLUCOSE 174* 89 60* 101* 78  BUN 59* 39* 29* 28* 25*  CREATININE 1.42* 1.17 1.14 1.16 1.17  CALCIUM 8.1* 8.4* 8.2* 8.5* 7.9*  MG 2.1 2.0 1.9 1.8 2.0  PHOS 3.9 3.9 3.7 4.0 3.5   Liver Function Tests: Recent Labs  Lab 10/04/20 0604 10/05/20 0309 10/06/20 0657 10/07/20 0559 10/08/20 0453  AST $Re'25 22 25 24 19  'Aof$ ALT $R'21 17 17 20 16  'CO$ ALKPHOS 53 56 60 68 66  BILITOT 0.5 0.8 0.8 0.8 0.4  PROT 5.0* 5.0* 5.3* 5.6* 5.2*  ALBUMIN 2.5* 2.5* 2.7* 2.8* 2.6*   Recent Labs  Lab 10/02/20 1747  LIPASE 28   No results for input(s): AMMONIA in the last 168 hours. CBC: Recent Labs  Lab 10/04/20 0604 10/04/20 1403 10/05/20 0309 10/05/20 0309 10/05/20 0846 10/05/20 1415 10/06/20 0657 10/07/20 0559 10/08/20 0453  WBC 6.0  --  7.0  --   --   --  6.7 4.7 2.1*  NEUTROABS 5.0  --  5.7  --   --   --  5.4 3.8 1.2*  HGB 6.6*   < > 8.6*   < > 9.5* 9.4* 8.6* 8.5* 7.9*  HCT 20.2*   < > 26.2*   < > 29.3* 28.8* 26.6* 26.5* 24.9*  MCV 92.2  --  91.9  --   --   --  92.0 92.7 93.3  PLT 197  --  168  --   --   --  148* 128* 96*   < > = values in this interval not displayed.   Cardiac Enzymes: No results for input(s): CKTOTAL,  CKMB, CKMBINDEX, TROPONINI in the last 168 hours. BNP: Invalid input(s): POCBNP CBG: Recent Labs  Lab 10/08/20 0725 10/08/20 0754 10/08/20 0824 10/08/20 0954 10/08/20 1157  GLUCAP 53* 55* 53* 119* 226*   D-Dimer No results for input(s): DDIMER in the last 72 hours. Hgb A1c Recent Labs    10/06/20 0657  HGBA1C 6.7*   Lipid Profile No results for input(s): CHOL, HDL, LDLCALC, TRIG, CHOLHDL, LDLDIRECT in the last 72 hours. Thyroid  function studies No results for input(s): TSH, T4TOTAL, T3FREE, THYROIDAB in the last 72 hours.  Invalid input(s): FREET3 Anemia work up No results for input(s): VITAMINB12, FOLATE, FERRITIN, TIBC, IRON, RETICCTPCT in the last 72 hours. Urinalysis    Component Value Date/Time   COLORURINE YELLOW 09/19/2020 1935   APPEARANCEUR CLEAR 09/19/2020 1935   LABSPEC 1.008 09/19/2020 1935   PHURINE 7.0 09/19/2020 1935   GLUCOSEU NEGATIVE 09/19/2020 1935   GLUCOSEU NEGATIVE 08/29/2019 0959   HGBUR NEGATIVE 09/19/2020 1935   BILIRUBINUR NEGATIVE 09/19/2020 1935   KETONESUR NEGATIVE 09/19/2020 1935   PROTEINUR NEGATIVE 09/19/2020 1935   UROBILINOGEN 0.2 08/29/2019 0959   NITRITE NEGATIVE 09/19/2020 1935   LEUKOCYTESUR NEGATIVE 09/19/2020 1935   Sepsis Labs Invalid input(s): PROCALCITONIN,  WBC,  LACTICIDVEN Microbiology Recent Results (from the past 240 hour(s))  Respiratory Panel by RT PCR (Flu A&B, Covid) - Nasopharyngeal Swab     Status: None   Collection Time: 10/02/20  5:57 PM   Specimen: Nasopharyngeal Swab  Result Value Ref Range Status   SARS Coronavirus 2 by RT PCR NEGATIVE NEGATIVE Final    Comment: (NOTE) SARS-CoV-2 target nucleic acids are NOT DETECTED.  The SARS-CoV-2 RNA is generally detectable in upper respiratoy specimens during the acute phase of infection. The lowest concentration of SARS-CoV-2 viral copies this assay can detect is 131 copies/mL. A negative result does not preclude SARS-Cov-2 infection and should not be used  as the sole basis for treatment or other patient management decisions. A negative result may occur with  improper specimen collection/handling, submission of specimen other than nasopharyngeal swab, presence of viral mutation(s) within the areas targeted by this assay, and inadequate number of viral copies (<131 copies/mL). A negative result must be combined with clinical observations, patient history, and epidemiological information. The expected result is Negative.  Fact Sheet for Patients:  https://www.moore.com/  Fact Sheet for Healthcare Providers:  https://www.young.biz/  This test is no t yet approved or cleared by the [place] FDA and  has been authorized for detection and/or diagnosis of SARS-CoV-2 by FDA under an Emergency Use Authorization (EUA). This EUA will remain  in effect (meaning this test can be used) for the duration of the COVID-19 declaration under Section 564(b)(1) of the Act, 21 U.S.C. section 360bbb-3(b)(1), unless the authorization is terminated or revoked sooner.     Influenza A by PCR NEGATIVE NEGATIVE Final   Influenza B by PCR NEGATIVE NEGATIVE Final    Comment: (NOTE) The Xpert Xpress SARS-CoV-2/FLU/RSV assay is intended as an aid in  the diagnosis of influenza from Nasopharyngeal swab specimens and  should not be used as a sole basis for treatment. Nasal washings and  aspirates are unacceptable for Xpert Xpress SARS-CoV-2/FLU/RSV  testing.  Fact Sheet for Patients: https://www.moore.com/  Fact Sheet for Healthcare Providers: https://www.young.biz/  This test is not yet approved or cleared by the [place] FDA and  has been authorized for detection and/or diagnosis of SARS-CoV-2 by  FDA under an Emergency Use Authorization (EUA). This EUA will remain  in effect (meaning this test can be used) for the duration of the  Covid-19 declaration under Section  564(b)(1) of the Act, 21  U.S.C. section 360bbb-3(b)(1), unless the authorization is  terminated or revoked. Performed at [facility], [address], [place], [place] [address]    Time coordinating discharge: 35 minutes  SIGNED:  Merlene Laughter, DO [facility] 10/08/2020, 7:39 PM Pager is on AMION  If 7PM-7AM, please contact night-coverage  [email or web address]

## 2020-10-08 NOTE — Progress Notes (Signed)
Physical Therapy Treatment Patient Details Name: Randall Wilkins MRN: [id number] DOB: August 14, 1941 Today's Date: 10/08/2020    History of Present Illness Patient is 79 y.o. 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 Pt presented to [facility] via EMS after feelign weak, nauseaous at home. Pt's wife reports she helped him slowly lower to the ground on his knees and called for EMS as he briefly lost consciousness. Pt admitted for AKI. s/p EGD.    PT Comments    Pt progressing well. incr tolerance to activity/incr gait distance with minimal DOE. Pt and wife are hopeful that he will be able to d/c today. Continue to recommend HHPT   Follow Up Recommendations  Home health PT;Supervision for mobility/OOB     Equipment Recommendations  None recommended by PT    Recommendations for Other Services       Precautions / Restrictions Precautions Precautions: Fall Precaution Comments: Hx Lung CA Restrictions Weight Bearing Restrictions: No L AFO    Mobility  Bed Mobility               General bed mobility comments: OOB in recliner  Transfers   Equipment used: Rolling walker (2 wheeled) Transfers: Sit to/from Stand Sit to Stand: Supervision;Min guard         General transfer comment: Increased time. Cues for safety.    Ambulation/Gait Ambulation/Gait assistance: Supervision;Min guard Gait Distance (Feet): 80 Feet Assistive device: Rolling walker (2 wheeled) Gait Pattern/deviations: Step-through pattern;Decreased stride length Gait velocity: decreased   General Gait Details: cues for RW position, safety, and posture   Stairs             Wheelchair Mobility    Modified Rankin (Stroke Patients Only)       Balance   Sitting-balance support: Feet supported Sitting balance-Leahy Scale: Fair     Standing balance support: Bilateral upper extremity supported Standing balance-Leahy  Scale: Poor Standing balance comment: reliant on UEs                             Cognition Arousal/Alertness: Awake/alert Behavior During Therapy: WFL for tasks assessed/performed Overall Cognitive Status: Within Functional Limits for tasks assessed                                 General Comments: AxO x 3 pleasant      Exercises      General Comments        Pertinent Vitals/Pain Pain Assessment: No/denies pain    Home Living                      Prior Function            PT Goals (current goals can now be found in the care plan section) Acute Rehab PT Goals PT Goal Formulation: With patient/family Time For Goal Achievement: 10/17/20 Potential to Achieve Goals: Good Progress towards PT goals: Progressing toward goals    Frequency    Min 3X/week      PT Plan Current plan remains appropriate    Co-evaluation              AM-PAC PT "6 Clicks" Mobility   Outcome Measure  Help needed turning from your back to your side while in a flat bed without using bedrails?: A Little Help  needed moving from lying on your back to sitting on the side of a flat bed without using bedrails?: A Little Help needed moving to and from a bed to a chair (including a wheelchair)?: A Little Help needed standing up from a chair using your arms (e.g., wheelchair or bedside chair)?: A Little Help needed to walk in hospital room?: A Little Help needed climbing 3-5 steps with a railing? : A Little 6 Click Score: 18    End of Session Equipment Utilized During Treatment: Gait belt Activity Tolerance: Patient tolerated treatment well Patient left: in chair;with bed alarm set;with family/visitor present   PT Visit Diagnosis: Muscle weakness (generalized) (M62.81);Other abnormalities of gait and mobility (R26.89);Unsteadiness on feet (R26.81);Difficulty in walking, not elsewhere classified (R26.2)     Time: 1152-1202 PT Time Calculation (min)  (ACUTE ONLY): 10 min  Charges:  $Gait Training: 8-22 mins                     Baxter Flattery, PT  Acute Rehab Dept ([place]) [phone] Pager [phone]  10/08/2020    [facility] 10/08/2020, 12:27 PM

## 2020-10-08 NOTE — Progress Notes (Signed)
Discharge instructions discussed with patient and family, verbalized agreement and understanding 

## 2020-10-09 DIAGNOSIS — G4733 Obstructive sleep apnea (adult) (pediatric): Secondary | ICD-10-CM | POA: Diagnosis not present

## 2020-10-09 DIAGNOSIS — I2510 Atherosclerotic heart disease of native coronary artery without angina pectoris: Secondary | ICD-10-CM | POA: Diagnosis not present

## 2020-10-09 DIAGNOSIS — C3491 Malignant neoplasm of unspecified part of right bronchus or lung: Secondary | ICD-10-CM | POA: Diagnosis not present

## 2020-10-09 DIAGNOSIS — I130 Hypertensive heart and chronic kidney disease with heart failure and stage 1 through stage 4 chronic kidney disease, or unspecified chronic kidney disease: Secondary | ICD-10-CM | POA: Diagnosis not present

## 2020-10-09 DIAGNOSIS — E1342 Other specified diabetes mellitus with diabetic polyneuropathy: Secondary | ICD-10-CM | POA: Diagnosis not present

## 2020-10-09 DIAGNOSIS — E1151 Type 2 diabetes mellitus with diabetic peripheral angiopathy without gangrene: Secondary | ICD-10-CM | POA: Diagnosis not present

## 2020-10-09 DIAGNOSIS — N179 Acute kidney failure, unspecified: Secondary | ICD-10-CM | POA: Diagnosis not present

## 2020-10-09 DIAGNOSIS — K2091 Esophagitis, unspecified with bleeding: Secondary | ICD-10-CM | POA: Diagnosis not present

## 2020-10-09 DIAGNOSIS — N1831 Chronic kidney disease, stage 3a: Secondary | ICD-10-CM | POA: Diagnosis not present

## 2020-10-09 DIAGNOSIS — I480 Paroxysmal atrial fibrillation: Secondary | ICD-10-CM | POA: Diagnosis not present

## 2020-10-09 DIAGNOSIS — E785 Hyperlipidemia, unspecified: Secondary | ICD-10-CM | POA: Diagnosis not present

## 2020-10-09 DIAGNOSIS — I5032 Chronic diastolic (congestive) heart failure: Secondary | ICD-10-CM | POA: Diagnosis not present

## 2020-10-09 DIAGNOSIS — D62 Acute posthemorrhagic anemia: Secondary | ICD-10-CM | POA: Diagnosis not present

## 2020-10-09 DIAGNOSIS — E1322 Other specified diabetes mellitus with diabetic chronic kidney disease: Secondary | ICD-10-CM | POA: Diagnosis not present

## 2020-10-09 DIAGNOSIS — M109 Gout, unspecified: Secondary | ICD-10-CM | POA: Diagnosis not present

## 2020-10-09 DIAGNOSIS — E039 Hypothyroidism, unspecified: Secondary | ICD-10-CM | POA: Diagnosis not present

## 2020-10-10 ENCOUNTER — Telehealth: Payer: Self-pay | Admitting: Medical Oncology

## 2020-10-10 NOTE — Telephone Encounter (Signed)
[place] for PT visits ? 1x week x 1    2 x week x3    1 week x 4 weeks Per Julien Nordmann , ok for PT

## 2020-10-12 ENCOUNTER — Other Ambulatory Visit: Payer: Self-pay | Admitting: Endocrinology

## 2020-10-14 ENCOUNTER — Other Ambulatory Visit: Payer: Self-pay

## 2020-10-14 ENCOUNTER — Other Ambulatory Visit: Payer: Self-pay | Admitting: Medical Oncology

## 2020-10-14 ENCOUNTER — Inpatient Hospital Stay: Payer: Medicare Other | Attending: Internal Medicine

## 2020-10-14 ENCOUNTER — Inpatient Hospital Stay: Payer: Medicare Other

## 2020-10-14 ENCOUNTER — Telehealth: Payer: Self-pay | Admitting: *Deleted

## 2020-10-14 VITALS — BP 125/52 | HR 62 | Temp 98.0°F | Resp 18

## 2020-10-14 DIAGNOSIS — Z5111 Encounter for antineoplastic chemotherapy: Secondary | ICD-10-CM | POA: Insufficient documentation

## 2020-10-14 DIAGNOSIS — C3431 Malignant neoplasm of lower lobe, right bronchus or lung: Secondary | ICD-10-CM | POA: Insufficient documentation

## 2020-10-14 DIAGNOSIS — Z79899 Other long term (current) drug therapy: Secondary | ICD-10-CM | POA: Insufficient documentation

## 2020-10-14 DIAGNOSIS — E1165 Type 2 diabetes mellitus with hyperglycemia: Secondary | ICD-10-CM | POA: Diagnosis not present

## 2020-10-14 DIAGNOSIS — Z794 Long term (current) use of insulin: Secondary | ICD-10-CM | POA: Diagnosis not present

## 2020-10-14 DIAGNOSIS — Z5112 Encounter for antineoplastic immunotherapy: Secondary | ICD-10-CM | POA: Insufficient documentation

## 2020-10-14 DIAGNOSIS — D6481 Anemia due to antineoplastic chemotherapy: Secondary | ICD-10-CM | POA: Diagnosis not present

## 2020-10-14 DIAGNOSIS — C3491 Malignant neoplasm of unspecified part of right bronchus or lung: Secondary | ICD-10-CM

## 2020-10-14 DIAGNOSIS — C3412 Malignant neoplasm of upper lobe, left bronchus or lung: Secondary | ICD-10-CM | POA: Insufficient documentation

## 2020-10-14 DIAGNOSIS — C781 Secondary malignant neoplasm of mediastinum: Secondary | ICD-10-CM | POA: Insufficient documentation

## 2020-10-14 DIAGNOSIS — E7800 Pure hypercholesterolemia, unspecified: Secondary | ICD-10-CM | POA: Diagnosis not present

## 2020-10-14 DIAGNOSIS — D649 Anemia, unspecified: Secondary | ICD-10-CM

## 2020-10-14 LAB — CBC WITH DIFFERENTIAL (CANCER CENTER ONLY)
Abs Immature Granulocytes: 0 10*3/uL (ref 0.00–0.07)
Basophils Absolute: 0 10*3/uL (ref 0.0–0.1)
Basophils Relative: 1 %
Eosinophils Absolute: 0 10*3/uL (ref 0.0–0.5)
Eosinophils Relative: 1 %
HCT: 21.6 % — ABNORMAL LOW (ref 39.0–52.0)
Hemoglobin: 7.1 g/dL — ABNORMAL LOW (ref 13.0–17.0)
Immature Granulocytes: 0 %
Lymphocytes Relative: 52 %
Lymphs Abs: 0.6 10*3/uL — ABNORMAL LOW (ref 0.7–4.0)
MCH: 29.6 pg (ref 26.0–34.0)
MCHC: 32.9 g/dL (ref 30.0–36.0)
MCV: 90 fL (ref 80.0–100.0)
Monocytes Absolute: 0.3 10*3/uL (ref 0.1–1.0)
Monocytes Relative: 30 %
Neutro Abs: 0.2 10*3/uL — CL (ref 1.7–7.7)
Neutrophils Relative %: 16 %
Platelet Count: 28 10*3/uL — ABNORMAL LOW (ref 150–400)
RBC: 2.4 MIL/uL — ABNORMAL LOW (ref 4.22–5.81)
RDW: 14.1 % (ref 11.5–15.5)
WBC Count: 1 10*3/uL — ABNORMAL LOW (ref 4.0–10.5)
nRBC: 0 % (ref 0.0–0.2)

## 2020-10-14 LAB — CMP (CANCER CENTER ONLY)
ALT: 13 U/L (ref 0–44)
AST: 16 U/L (ref 15–41)
Albumin: 2.7 g/dL — ABNORMAL LOW (ref 3.5–5.0)
Alkaline Phosphatase: 87 U/L (ref 38–126)
Anion gap: 10 (ref 5–15)
BUN: 20 mg/dL (ref 8–23)
CO2: 27 mmol/L (ref 22–32)
Calcium: 8.6 mg/dL — ABNORMAL LOW (ref 8.9–10.3)
Chloride: 104 mmol/L (ref 98–111)
Creatinine: 1.56 mg/dL — ABNORMAL HIGH (ref 0.61–1.24)
GFR, Estimated: 45 mL/min — ABNORMAL LOW (ref >60)
Glucose, Bld: 173 mg/dL — ABNORMAL HIGH (ref 70–99)
Potassium: 4.2 mmol/L (ref 3.5–5.1)
Sodium: 141 mmol/L (ref 135–145)
Total Bilirubin: 0.5 mg/dL (ref 0.3–1.2)
Total Protein: 5.7 g/dL — ABNORMAL LOW (ref 6.5–8.1)

## 2020-10-14 LAB — PREPARE RBC (CROSSMATCH)

## 2020-10-14 LAB — GLUCOSE, CAPILLARY: Glucose-Capillary: 38 mg/dL — CL (ref 70–99)

## 2020-10-14 MED ORDER — FILGRASTIM-SNDZ 480 MCG/0.8ML IJ SOSY
480.0000 ug | PREFILLED_SYRINGE | Freq: Once | INTRAMUSCULAR | Status: AC
  Administered 2020-10-14: 480 ug via SUBCUTANEOUS

## 2020-10-14 MED ORDER — DIPHENHYDRAMINE HCL 25 MG PO CAPS
25.0000 mg | ORAL_CAPSULE | Freq: Once | ORAL | Status: AC
  Administered 2020-10-14: 25 mg via ORAL

## 2020-10-14 MED ORDER — SODIUM CHLORIDE 0.9% IV SOLUTION
250.0000 mL | Freq: Once | INTRAVENOUS | Status: AC
  Administered 2020-10-14: 250 mL via INTRAVENOUS
  Filled 2020-10-14: qty 250

## 2020-10-14 MED ORDER — HEPARIN SOD (PORK) LOCK FLUSH 100 UNIT/ML IV SOLN
500.0000 [IU] | Freq: Every day | INTRAVENOUS | Status: AC | PRN
  Administered 2020-10-14: 500 [IU]
  Filled 2020-10-14: qty 5

## 2020-10-14 MED ORDER — FILGRASTIM-SNDZ 480 MCG/0.8ML IJ SOSY
PREFILLED_SYRINGE | INTRAMUSCULAR | Status: AC
  Filled 2020-10-14: qty 0.8

## 2020-10-14 MED ORDER — ACETAMINOPHEN 325 MG PO TABS
650.0000 mg | ORAL_TABLET | Freq: Once | ORAL | Status: AC
  Administered 2020-10-14: 650 mg via ORAL

## 2020-10-14 MED ORDER — DIPHENHYDRAMINE HCL 25 MG PO CAPS
ORAL_CAPSULE | ORAL | Status: AC
  Filled 2020-10-14: qty 1

## 2020-10-14 MED ORDER — SODIUM CHLORIDE 0.9% FLUSH
10.0000 mL | INTRAVENOUS | Status: AC | PRN
  Administered 2020-10-14: 10 mL
  Filled 2020-10-14: qty 10

## 2020-10-14 MED ORDER — ACETAMINOPHEN 325 MG PO TABS
ORAL_TABLET | ORAL | Status: AC
  Filled 2020-10-14: qty 2

## 2020-10-14 NOTE — Telephone Encounter (Signed)
Received call from lab. ANC is 0.2   Diane, RN with Dr. Julien Nordmann made aware

## 2020-10-14 NOTE — Patient Instructions (Signed)

## 2020-10-15 ENCOUNTER — Other Ambulatory Visit: Payer: Self-pay

## 2020-10-15 ENCOUNTER — Other Ambulatory Visit: Payer: Self-pay | Admitting: Medical Oncology

## 2020-10-15 ENCOUNTER — Other Ambulatory Visit: Payer: Medicare Other

## 2020-10-15 ENCOUNTER — Inpatient Hospital Stay: Payer: Medicare Other

## 2020-10-15 ENCOUNTER — Telehealth: Payer: Self-pay

## 2020-10-15 VITALS — BP 131/59 | HR 61 | Temp 98.1°F | Resp 18

## 2020-10-15 DIAGNOSIS — C3412 Malignant neoplasm of upper lobe, left bronchus or lung: Secondary | ICD-10-CM | POA: Diagnosis not present

## 2020-10-15 DIAGNOSIS — C781 Secondary malignant neoplasm of mediastinum: Secondary | ICD-10-CM | POA: Diagnosis not present

## 2020-10-15 DIAGNOSIS — Z5112 Encounter for antineoplastic immunotherapy: Secondary | ICD-10-CM | POA: Diagnosis not present

## 2020-10-15 DIAGNOSIS — D6481 Anemia due to antineoplastic chemotherapy: Secondary | ICD-10-CM | POA: Diagnosis not present

## 2020-10-15 DIAGNOSIS — C3431 Malignant neoplasm of lower lobe, right bronchus or lung: Secondary | ICD-10-CM | POA: Diagnosis not present

## 2020-10-15 DIAGNOSIS — Z79899 Other long term (current) drug therapy: Secondary | ICD-10-CM | POA: Diagnosis not present

## 2020-10-15 DIAGNOSIS — Z5111 Encounter for antineoplastic chemotherapy: Secondary | ICD-10-CM | POA: Diagnosis not present

## 2020-10-15 DIAGNOSIS — C3491 Malignant neoplasm of unspecified part of right bronchus or lung: Secondary | ICD-10-CM

## 2020-10-15 LAB — PREPARE RBC (CROSSMATCH)

## 2020-10-15 MED ORDER — FILGRASTIM-SNDZ 480 MCG/0.8ML IJ SOSY
480.0000 ug | PREFILLED_SYRINGE | Freq: Once | INTRAMUSCULAR | Status: AC
  Administered 2020-10-15: 480 ug via SUBCUTANEOUS

## 2020-10-15 MED ORDER — DIPHENHYDRAMINE HCL 25 MG PO CAPS
25.0000 mg | ORAL_CAPSULE | Freq: Once | ORAL | Status: AC
  Administered 2020-10-15: 25 mg via ORAL

## 2020-10-15 MED ORDER — DIPHENHYDRAMINE HCL 25 MG PO CAPS
ORAL_CAPSULE | ORAL | Status: AC
  Filled 2020-10-15: qty 1

## 2020-10-15 MED ORDER — HEPARIN SOD (PORK) LOCK FLUSH 100 UNIT/ML IV SOLN
500.0000 [IU] | Freq: Every day | INTRAVENOUS | Status: AC | PRN
  Administered 2020-10-15: 500 [IU]
  Filled 2020-10-15: qty 5

## 2020-10-15 MED ORDER — ACETAMINOPHEN 325 MG PO TABS
ORAL_TABLET | ORAL | Status: AC
  Filled 2020-10-15: qty 2

## 2020-10-15 MED ORDER — SODIUM CHLORIDE 0.9% IV SOLUTION
250.0000 mL | Freq: Once | INTRAVENOUS | Status: AC
  Administered 2020-10-15: 250 mL via INTRAVENOUS
  Filled 2020-10-15: qty 250

## 2020-10-15 MED ORDER — SODIUM CHLORIDE 0.9% FLUSH
10.0000 mL | INTRAVENOUS | Status: AC | PRN
  Administered 2020-10-15: 10 mL
  Filled 2020-10-15: qty 10

## 2020-10-15 MED ORDER — ACETAMINOPHEN 325 MG PO TABS
650.0000 mg | ORAL_TABLET | Freq: Once | ORAL | Status: AC
  Administered 2020-10-15: 650 mg via ORAL

## 2020-10-15 MED ORDER — FILGRASTIM-SNDZ 480 MCG/0.8ML IJ SOSY
PREFILLED_SYRINGE | INTRAMUSCULAR | Status: AC
  Filled 2020-10-15: qty 0.8

## 2020-10-15 NOTE — Patient Instructions (Signed)
Tbo-Filgrastim injection What is this medicine? TBO-FILGRASTIM (T B O fil GRA stim) is a granulocyte colony-stimulating factor that helps you make more neutrophils, a type of white blood cell. Neutrophils are important for fighting infections. Some chemotherapy affects your bone marrow and lowers your neutrophils. This medicine helps decrease the length of time that neutrophils are very low (severe neutropenia). This medicine may be used for other purposes; ask your health care provider or pharmacist if you have questions. COMMON BRAND NAME(S): Granix What should I tell my health care provider before I take this medicine? They need to know if you have any of these conditions:  bone scan or tests planned  kidney disease  sickle cell anemia  an unusual or allergic reaction to tbo-filgrastim, filgrastim, pegfilgrastim, other medicines, foods, dyes, or preservatives  pregnant or trying to get pregnant  breast-feeding How should I use this medicine? This medicine is for injection under the skin. If you get this medicine at home, you will be taught how to prepare and give this medicine. Refer to the Instructions for Use that come with your medication packaging. Use exactly as directed. Take your medicine at regular intervals. Do not take your medicine more often than directed. It is important that you put your used needles and syringes in a special sharps container. Do not put them in a trash can. If you do not have a sharps container, call your pharmacist or healthcare provider to get one. Talk to your pediatrician regarding the use of this medicine in children. While this drug may be prescribed for children as young as 9 month of age for selected conditions, precautions do apply. Overdosage: If you think you have taken too much of this medicine contact a poison control center or emergency room at once. NOTE: This medicine is only for you. Do not share this medicine with others. What if I miss a  dose? It is important not to miss your dose. Call your doctor or health care professional if you miss a dose. What may interact with this medicine? This medicine may interact with the following medications:  medicines that may cause a release of neutrophils, such as lithium This list may not describe all possible interactions. Give your health care provider a list of all the medicines, herbs, non-prescription drugs, or dietary supplements you use. Also tell them if you smoke, drink alcohol, or use illegal drugs. Some items may interact with your medicine. What should I watch for while using this medicine? You may need blood work done while you are taking this medicine. What side effects may I notice from receiving this medicine? Side effects that you should report to your doctor or health care professional as soon as possible:  allergic reactions like skin rash, itching or hives, swelling of the face, lips, or tongue  back pain  blood in the urine  dark urine  dizziness  fast heartbeat  feeling faint  shortness of breath or breathing problems  signs and symptoms of infection like fever or chills; cough; or sore throat  signs and symptoms of kidney injury like trouble passing urine or change in the amount of urine  stomach or side pain, or pain at the shoulder  sweating  swelling of the legs, ankles, or abdomen  tiredness Side effects that usually do not require medical attention (report to your doctor or health care professional if they continue or are bothersome):  bone pain  diarrhea  headache  muscle pain  vomiting This list may  not describe all possible side effects. Call your doctor for medical advice about side effects. You may report side effects to FDA at 1-800-FDA-1088. Where should I keep my medicine? Keep out of the reach of children. Store in a refrigerator between 2 and 8 degrees C (36 and 46 degrees F). Keep in carton to protect from light. Throw  away this medicine if it is left out of the refrigerator for more than 5 consecutive days. Throw away any unused medicine after the expiration date. NOTE: This sheet is a summary. It may not cover all possible information. If you have questions about this medicine, talk to your doctor, pharmacist, or health care provider.  2020 Elsevier/Gold Standard (2018-10-01 19:58:39)  Blood Transfusion, Adult, Care After This sheet gives you information about how to care for yourself after your procedure. Your doctor may also give you more specific instructions. If you have problems or questions, contact your doctor. What can I expect after the procedure? After the procedure, it is common to have:  Bruising and soreness at the IV site.  A fever or chills on the day of the procedure. This may be your body's response to the new blood cells received.  A headache. Follow these instructions at home: Insertion site care      Follow instructions from your doctor about how to take care of your insertion site. This is where an IV tube was put into your vein. Make sure you: ? Wash your hands with soap and water before and after you change your bandage (dressing). If you cannot use soap and water, use hand sanitizer. ? Change your bandage as told by your doctor.  Check your insertion site every day for signs of infection. Check for: ? Redness, swelling, or pain. ? Bleeding from the site. ? Warmth. ? Pus or a bad smell. General instructions  Take over-the-counter and prescription medicines only as told by your doctor.  Rest as told by your doctor.  Go back to your normal activities as told by your doctor.  Keep all follow-up visits as told by your doctor. This is important. Contact a doctor if:  You have itching or red, swollen areas of skin (hives).  You feel worried or nervous (anxious).  You feel weak after doing your normal activities.  You have redness, swelling, warmth, or pain around  the insertion site.  You have blood coming from the insertion site, and the blood does not stop with pressure.  You have pus or a bad smell coming from the insertion site. Get help right away if:  You have signs of a serious reaction. This may be coming from an allergy or the body's defense system (immune system). Signs include: ? Trouble breathing or shortness of breath. ? Swelling of the face or feeling warm (flushed). ? Fever or chills. ? Head, chest, or back pain. ? Dark pee (urine) or blood in the pee. ? Widespread rash. ? Fast heartbeat. ? Feeling dizzy or light-headed. You may receive your blood transfusion in an outpatient setting. If so, you will be told whom to contact to report any reactions. These symptoms may be an emergency. Do not wait to see if the symptoms will go away. Get medical help right away. Call your local emergency services (911 in the [place] Do not drive yourself to the hospital. Summary  Bruising and soreness at the IV site are common.  Check your insertion site every day for signs of infection.  Rest as told by your  doctor. Go back to your normal activities as told by your doctor.  Get help right away if you have signs of a serious reaction. This information is not intended to replace advice given to you by your health care provider. Make sure you discuss any questions you have with your health care provider. Document Revised: 05/25/2019 Document Reviewed: 05/25/2019 Elsevier Patient Education  [place].

## 2020-10-15 NOTE — Telephone Encounter (Signed)
Randall Wilkins with [facility] wanting to know if occupational therapy once a week for up to 6 weeks is ok.  I have called brandon back and advised per Dr. Julien Nordmann, this is ok.

## 2020-10-16 ENCOUNTER — Other Ambulatory Visit: Payer: Self-pay

## 2020-10-16 ENCOUNTER — Inpatient Hospital Stay: Payer: Medicare Other

## 2020-10-16 VITALS — BP 137/53 | HR 57 | Temp 98.5°F | Resp 17

## 2020-10-16 DIAGNOSIS — C3412 Malignant neoplasm of upper lobe, left bronchus or lung: Secondary | ICD-10-CM | POA: Diagnosis not present

## 2020-10-16 DIAGNOSIS — Z79899 Other long term (current) drug therapy: Secondary | ICD-10-CM | POA: Diagnosis not present

## 2020-10-16 DIAGNOSIS — Z5112 Encounter for antineoplastic immunotherapy: Secondary | ICD-10-CM | POA: Diagnosis not present

## 2020-10-16 DIAGNOSIS — Z5111 Encounter for antineoplastic chemotherapy: Secondary | ICD-10-CM | POA: Diagnosis not present

## 2020-10-16 DIAGNOSIS — C781 Secondary malignant neoplasm of mediastinum: Secondary | ICD-10-CM | POA: Diagnosis not present

## 2020-10-16 DIAGNOSIS — D6481 Anemia due to antineoplastic chemotherapy: Secondary | ICD-10-CM | POA: Diagnosis not present

## 2020-10-16 DIAGNOSIS — C3491 Malignant neoplasm of unspecified part of right bronchus or lung: Secondary | ICD-10-CM

## 2020-10-16 DIAGNOSIS — C3431 Malignant neoplasm of lower lobe, right bronchus or lung: Secondary | ICD-10-CM | POA: Diagnosis not present

## 2020-10-16 LAB — BPAM RBC
Blood Product Expiration Date: 202111122359
Blood Product Expiration Date: 202111302359
ISSUE DATE / TIME: 202111011517
ISSUE DATE / TIME: 202111021500
Unit Type and Rh: 9500
Unit Type and Rh: 9500

## 2020-10-16 LAB — TYPE AND SCREEN
ABO/RH(D): O NEG
Antibody Screen: NEGATIVE
Unit division: 0
Unit division: 0

## 2020-10-16 MED ORDER — FILGRASTIM-SNDZ 480 MCG/0.8ML IJ SOSY
480.0000 ug | PREFILLED_SYRINGE | Freq: Once | INTRAMUSCULAR | Status: AC
  Administered 2020-10-16: 480 ug via SUBCUTANEOUS

## 2020-10-16 MED ORDER — FILGRASTIM-SNDZ 480 MCG/0.8ML IJ SOSY
PREFILLED_SYRINGE | INTRAMUSCULAR | Status: AC
  Filled 2020-10-16: qty 0.8

## 2020-10-16 NOTE — Patient Instructions (Signed)

## 2020-10-17 ENCOUNTER — Encounter: Payer: Self-pay | Admitting: Endocrinology

## 2020-10-17 ENCOUNTER — Encounter (HOSPITAL_COMMUNITY): Payer: Self-pay | Admitting: Internal Medicine

## 2020-10-17 ENCOUNTER — Ambulatory Visit (INDEPENDENT_AMBULATORY_CARE_PROVIDER_SITE_OTHER): Payer: Medicare Other | Admitting: Endocrinology

## 2020-10-17 VITALS — BP 110/70 | HR 85 | Ht 67.0 in | Wt 248.4 lb

## 2020-10-17 DIAGNOSIS — I10 Essential (primary) hypertension: Secondary | ICD-10-CM | POA: Diagnosis not present

## 2020-10-17 DIAGNOSIS — E7800 Pure hypercholesterolemia, unspecified: Secondary | ICD-10-CM

## 2020-10-17 DIAGNOSIS — Z794 Long term (current) use of insulin: Secondary | ICD-10-CM | POA: Diagnosis not present

## 2020-10-17 DIAGNOSIS — E1165 Type 2 diabetes mellitus with hyperglycemia: Secondary | ICD-10-CM

## 2020-10-17 NOTE — Progress Notes (Signed)
Patient ID: Randall Wilkins, male   DOB: 05-23-1941, 79 y.o.   MRN: [id number]           Reason for Appointment: Follow-up for Type 2 Diabetes   History of Present Illness:          Date of diagnosis of type 2 diabetes mellitus:   1998       Background history:   Has been on insulin since about 2001 He thinks he was given metformin only for a month and may have stopped because of renal dysfunction Also Actos was tried and not clear what side effects he had Does not remember other medication he may have tried but has not tried Victoza or Byetta   Recent history:    INSULIN regimen is:   [place] 24 units at 8 PM .  Humalog 5 to 10 units before meals  Non-insulin hypoglycemic drugs the patient is taking are:  Metformin 750 mg daily  His A1c is 6.7  Current management, blood sugar patterns and problems identified:   His wife is adjusting his insulin doses and helping with his glucose monitoring  He is still off Victoza with decreased appetite  When hospitalized for lung cancer treatment his blood sugars were low and his Tyler Aas has been reduced significantly, previously on 56 units  However he gets steroids with his chemotherapy every 3 weeks and blood sugar has been as high as 349 when he gets steroid  His wife is giving him as much as 22 units of Humalog for high sugars without consistent improvement  Also last month he was still tending to have some low sugars at all different times  hypoglycemia has been minimal recently with only 1 low normal reading of 68 before dinner  As before he is usually not checking readings after dinner even with using the freestyle libre  His wife thinks that his freestyle Elenor Legato is now also is accurate although by comparison most of his sugars are close to the fingersticks   medications.  Interpretation of the freestyle Elenor Legato is as follows, this is for 2 weeks subsequent to his chemotherapy dose  Blood sugars are not being monitored at  bedtime otherwise that is fairly complete at least overnight  Blood sugars are fairly stable throughout the day with slight increase after breakfast  However OVERNIGHT blood sugars are variably high, likely to be higher when he is having a bedtime snacks like Ensure  Otherwise HYPERGLYCEMIA above 180 is present only occasionally overnight or after breakfast  No HYPOGLYCEMIA except once low normal around 6 PM  His weight is overall the same    Glucose monitoring:  Checking blood sugars 3-4 times a day       Glucometer:  Libre/contour   CGM use % of time  45  2-week average/SD  129, GV 29  Time in range       89%  % Time Above 180  10  % Time above 250   % Time Below 70 1   Glucose ranges are from fingerstick and averages from freestyle libre   PRE-MEAL Fasting Lunch Dinner Bedtime Overall  Glucose range:  51-310  57-349  61-140    Averages:  115  145  114  140    POST-MEAL PC Breakfast PC Lunch PC Dinner  Glucose range:    68-327  Averages:           PRE-MEAL Fasting Lunch Dinner Bedtime Overall  Glucose range:  60- 130  69-338  75-199  810-175   Mean/median:  93  109  109  110   Previous readings:  PRE-MEAL Fasting Lunch Dinner Bedtime Overall  Glucose range:  69-197  72-288 60-358    Mean/median:  111  119  108   112   POST-MEAL PC Breakfast PC Lunch PC Dinner  Glucose range:    157  Mean/median:        Self-care:  Meal times are:  Breakfast is at 8 a.m., lunch: 12 noon Dinner: 5-6 PM   Typical meal intake: Breakfast is sometimes doughnuts or sometimes egg sandwich   lunch is a sandwich or cottage cheese/food, dinner is meat and vegetables/solid    Snacks: Sugar-free Jell-O or pudding, crackers          Dietician visit, most recent: 12/2017               CDE 8/18  Exercise:  unable to do any, using walker for ambulation  Weight history: Range 252-280 previously  Wt Readings from Last 3 Encounters:  10/18/20 248 lb (112.5 kg)  10/17/20 248 lb 6.4  oz (112.7 kg)  10/07/20 241 lb 10 oz (109.6 kg)    Glycemic control:   Lab Results  Component Value Date   HGBA1C 6.7 (H) 10/06/2020   HGBA1C 6.4 05/20/2020   HGBA1C 7.3 (H) 02/12/2020   Lab Results  Component Value Date   MICROALBUR 35.5 (H) 02/12/2020   LDLCALC 43 05/23/2019   CREATININE 1.56 (H) 10/14/2020   Lab Results  Component Value Date   MICRALBCREAT 52.7 (H) 02/12/2020    Lab Results  Component Value Date   FRUCTOSAMINE 252 12/17/2017   FRUCTOSAMINE 233 08/09/2017      Allergies as of 10/17/2020      Reactions   Actos [pioglitazone] Swelling   Metformin And Related Nausea Only   Niaspan [niacin Er] Itching, Rash      Medication List       Accurate as of October 17, 2020 11:59 PM. If you have any questions, ask your nurse or doctor.        albuterol 108 (90 Base) MCG/ACT inhaler Commonly known as: VENTOLIN HFA Inhale 2 puffs into the lungs every 6 (six) hours as needed for wheezing or shortness of breath.   allopurinol 300 MG tablet Commonly known as: ZYLOPRIM Take 300 mg by mouth daily.   amitriptyline 50 MG tablet Commonly known as: ELAVIL Take 75 mg by mouth at bedtime.   apixaban 5 MG Tabs tablet Commonly known as: ELIQUIS Take 1 tablet (5 mg total) by mouth 2 (two) times daily.   Contour Next Monitor w/Device Kit 1 Device by Does not apply route 3 (three) times daily. Use to check blood sugars 3 times daily. Dx Code E13.9   Contour Next Test test strip Generic drug: glucose blood USE 1 STRIP TO CHECK GLUCOSE 4 TIMES DAILY   donepezil 10 MG tablet Commonly known as: ARICEPT TAKE ONE TABLET DAILY.  PLEASE CALL [phone] TO SCHEDULE FOLLOW UP. What changed:   how much to take  how to take this  when to take this  additional instructions   Easy Comfort Pen Needles 33G X 4 MM Misc Generic drug: Insulin Pen Needle 1 each by Does not apply route See admin instructions. Use to inject insulin 5 times daily.   feeding  supplement (GLUCERNA SHAKE) Liqd Take 237 mLs by mouth 3 (three) times daily between meals.   folic acid 1 MG tablet Commonly known as:  FOLVITE Take 1 tablet (1 mg total) by mouth daily.   furosemide 40 MG tablet Commonly known as: LASIX Take 80 mg by mouth in the morning.   HumaLOG 100 UNIT/ML cartridge Generic drug: insulin lispro Inject 5-15 Units into the skin 3 (three) times daily with meals. Sliding scale.   levothyroxine 75 MCG tablet Commonly known as: SYNTHROID Take 75 mcg by mouth daily before breakfast.   lidocaine 2 % solution Commonly known as: XYLOCAINE Use as directed 15 mLs in the mouth or throat as needed for mouth pain.   lidocaine-prilocaine cream Commonly known as: EMLA Apply 1 application topically as needed. Apply a teaspoon over port site at least 1 hour prior to lab appt. Do not rub in and cover with plastic wrap.   lisinopril 10 MG tablet Commonly known as: ZESTRIL TAKE ONE TABLET DAILY   metFORMIN 750 MG 24 hr tablet Commonly known as: GLUCOPHAGE-XR TAKE 1 TABLET WITH DINNER What changed: See the new instructions.   metoprolol succinate 25 MG 24 hr tablet Commonly known as: TOPROL-XL Take 1 tablet (25 mg total) by mouth daily.   NON FORMULARY Inhale 1 application into the lungs at bedtime. CPAP   ondansetron 4 MG tablet Commonly known as: ZOFRAN Take 1 tablet (4 mg total) by mouth every 6 (six) hours as needed for nausea.   pantoprazole 40 MG tablet Commonly known as: PROTONIX Take 1 tablet (40 mg total) by mouth 2 (two) times daily before a meal.   potassium chloride SA 20 MEQ tablet Commonly known as: KLOR-CON Take 20 mEq by mouth 2 (two) times daily.   prochlorperazine 10 MG tablet Commonly known as: COMPAZINE Take 1 tablet (10 mg total) by mouth every 6 (six) hours as needed for nausea or vomiting.   rosuvastatin 20 MG tablet Commonly known as: CRESTOR Take 20 mg by mouth daily.   tamsulosin 0.4 MG Caps capsule Commonly  known as: FLOMAX Take 0.4 mg by mouth daily.   Tyler Aas FlexTouch 200 UNIT/ML FlexTouch Pen Generic drug: insulin degludec Inject 24 Units into the skin at bedtime.   Victoza 18 MG/3ML Sopn Generic drug: liraglutide Inject 1.2 mg into the skin daily.   vitamin B-12 1000 MCG tablet Commonly known as: CYANOCOBALAMIN Take 1,000 mcg by mouth daily.       Allergies:  Allergies  Allergen Reactions  . Actos [Pioglitazone] Swelling  . Metformin And Related Nausea Only  . Niaspan [Niacin Er] Itching and Rash    Past Medical History:  Diagnosis Date  . Aortic stenosis, mild 11/14/2013  . Aortic valve sclerosis 03/29/2015  . Bilateral leg edema 05/21/2014  . CAD (coronary artery disease)   . Chronic diastolic congestive heart failure ([place])   . Chronic kidney disease (CKD), stage III (moderate) (HCC)   . Diabetes 1.5, managed as type 1 ([place]) 02/04/2013  . Diabetic peripheral neuropathy associated with type 1 diabetes mellitus ([place]) 11/14/2013  . Dysphagia   . Dyspnea on exertion 03/21/2015  . Exogenous obesity   . Gout   . Heart attack ([place])   . History of nuclear stress test 07/2011   dipyridamole; fixed inferolateral defect, worse at stress than rest; no reversible ischemia; low risk scan   . Hyperlipidemia   . Hypertension   . Hypothyroidism   . Insulin dependent diabetes mellitus   . Left foot drop   . Left main coronary artery disease 03/28/2015  . Memory loss   . Obesity (BMI 30-39.9) 11/14/2013  . Obstructive sleep apnea  03/21/2015  . OSA on CPAP    uses a cpap  . Peptic ulcer with hemorrhage 03/28/2015  . Peripheral neuropathy   . Pneumonia   . Rhabdomyolysis   . 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 Stroke ([place])    L patietal with small scattered lacunar infarcts  . Thrombocytopenia ([place]) 03/21/2015  . Venous insufficiency   . Weakness generalized 03/21/2015    Past Surgical History:  Procedure Laterality Date  . BACK SURGERY  2002    lumbosacral. 11 back surgeries total  . BRONCHIAL BIOPSY  08/06/2020   Procedure: BRONCHIAL BIOPSIES;  Surgeon: Collene Gobble, MD;  Location: [facility] ENDOSCOPY;  Service: Pulmonary;;  . BRONCHIAL BRUSHINGS  08/06/2020   Procedure: BRONCHIAL BRUSHINGS;  Surgeon: Collene Gobble, MD;  Location: [facility] ENDOSCOPY;  Service: Pulmonary;;  . BRONCHIAL NEEDLE ASPIRATION BIOPSY  08/06/2020   Procedure: BRONCHIAL NEEDLE ASPIRATION BIOPSIES;  Surgeon: Collene Gobble, MD;  Location: [facility] ENDOSCOPY;  Service: Pulmonary;;  . Carotid Doppler  03/2013   bilat bulb/prox ICAs - mild amount of fibrous plaque with no evidence of diameter reduction  . CARPAL TUNNEL RELEASE Bilateral 08/09/2014   Procedure: BILATERAL CARPAL TUNNEL RELEASE;  Surgeon: Daryll Brod, MD;  Location: [place];  Service: Orthopedics;  Laterality: Bilateral;  ANESTHESIA:  IV REGIONAL BIL FAB  . CHOLECYSTECTOMY    . COLONOSCOPY    . CORONARY ANGIOPLASTY  10/13/1996  . CORONARY ANGIOPLASTY  09/21/1989   emergency PTCA  . CORONARY ANGIOPLASTY  10/13/1996   Multi-Link diagonal & OD stenting (Dr. Marella Chimes)  . CORONARY ANGIOPLASTY  12/03/1997   disease of mid DX-1 ~50% & in mid PLA & PDA (distal lesions) (Dr. Marella Chimes)   . CORONARY ANGIOPLASTY  10/14/1999   progression of disease distal PLA & PDA; progression of disease prox RCA - moderate (Dr. Marella Chimes)   . CORONARY ANGIOPLASTY WITH STENT PLACEMENT  04/04/2004   4.0x49m non-DES (thrombectomy via AngioJet) to RCA for high grade stenosis (Dr. RMarella Chimes  . CORONARY ARTERY BYPASS GRAFT N/A 03/29/2015   Procedure: CORONARY ARTERY BYPASS GRAFTING TIMES TWO USING LEFT INTERNAL MAMMARY ARTERY AND RIGHT LEG GREATER SAPHENOUS VEIN HARVESTED ENDOSCOPICALLY.;  Surgeon: CRexene Alberts MD;  [facility]  Service: Open Heart Surgery;  Laterality: N/A;  . ESOPHAGOGASTRODUODENOSCOPY N/A 03/27/2015   Procedure: ESOPHAGOGASTRODUODENOSCOPY (EGD);  Surgeon: MClarene Essex MD;  [facility];  Service: Endoscopy;  Laterality: N/A;  possible dilation  . ESOPHAGOGASTRODUODENOSCOPY N/A 10/05/2020   Procedure: ESOPHAGOGASTRODUODENOSCOPY (EGD);  Surgeon: OArta Silence MD;  [facility];  Service: Endoscopy;  Laterality: N/A;  . FINE NEEDLE ASPIRATION  08/06/2020   Procedure: FINE NEEDLE ASPIRATION (FNA) LINEAR;  Surgeon: BCollene Gobble MD;  [facility];  Service: Pulmonary;;  . IR IMAGING GUIDED PORT INSERTION  10/07/2020  . LEFT HEART CATHETERIZATION WITH CORONARY ANGIOGRAM N/A 03/28/2015   Procedure: LEFT HEART CATHETERIZATION WITH CORONARY ANGIOGRAM;  Surgeon: Peter M JMartinique MD;  [facility];  Service: Cardiovascular;  Laterality: N/A;  . SINUS ENDO W/FUSION    . TONSILLECTOMY    . TRANSTHORACIC ECHOCARDIOGRAM  08/08/2013   EF 55-60%, mild conc hypertrophy, grade 1 diastolic dysfunction; AV with mild stenosis; LA & RA mildly dilated  . VIDEO BRONCHOSCOPY WITH ENDOBRONCHIAL NAVIGATION N/A 08/06/2020   Procedure: VIDEO BRONCHOSCOPY WITH ENDOBRONCHIAL NAVIGATION;  Surgeon: BCollene Gobble MD;  [facility];  Service: Pulmonary;  Laterality: N/A;  . VIDEO BRONCHOSCOPY WITH ENDOBRONCHIAL ULTRASOUND N/A 08/06/2020   Procedure: VIDEO BRONCHOSCOPY WITH ENDOBRONCHIAL ULTRASOUND;  Surgeon: Collene Gobble, MD;  Location: [facility] ENDOSCOPY;  Service: Pulmonary;  Laterality: N/A;    Family History  Problem Relation Age of Onset  . Heart disease Mother   . Coronary artery disease Father   . Cancer Maternal Grandmother   . Heart Problems Maternal Grandfather   . Diabetes Son        borderline     Social History:  reports that he quit smoking about 46 years ago. His smoking use included cigarettes. He has a 40.00 pack-year smoking history. He has never used smokeless tobacco. He reports that he does not drink alcohol and does not use drugs.   Review of Systems   Lipid history: Last LDL  on 20 mg rosuvastatin, followed by PCP No recent  labs available, labs drawn at cancer center are not reported  Has history of CAD    Lab Results  Component Value Date   CHOL 98 05/23/2019   HDL 38.40 (L) 05/23/2019   LDLCALC 43 05/23/2019   TRIG 83.0 05/23/2019   CHOLHDL 3 05/23/2019           Hypertension: Mild and treated with 10 mg lisinopril Also has been followed by PCP  BP Readings from Last 3 Encounters:  10/18/20 110/70  10/17/20 110/70  10/16/20 (!) 137/53     CKD: His creatinine is somewhat variable but recently higher  His last microalbumin ratio was 53  Lab Results  Component Value Date   CREATININE 1.56 (H) 10/14/2020   CREATININE 1.17 10/08/2020   CREATININE 1.16 10/07/2020    Most recent eye exam was in 2/20, Dr. Gershon Crane and reportedly no retinopathy   Complications of diabetes: Peripheral neuropathy with sensory loss.  Dementia: Followed by neurologist and is on Aricept     Physical Examination:  BP 110/70   Pulse 85   Ht $R'5\' 7"'bU$  (1.702 m)   Wt 248 lb 6.4 oz (112.7 kg)   SpO2 96%   BMI 38.90 kg/m       ASSESSMENT:  Diabetes type 2, insulin-dependent with obesity and history of neuropathy  See history of present illness for detailed discussion of current diabetes management, blood sugar patterns from his meter and freestyle [employer] and problems identified  His A1c is 6.7  He is on basal bolus insulin regimen, Metformin and not on Victoza  His blood sugars have been somewhat erratic partly because of getting steroids with chemotherapy However now with his diagnosis of stage IV lung cancer his treatment for diabetes will need to be tempered  Most of his blood sugar patterns recently indicate fairly good control with current basal bolus regimen including relatively low doses of mealtime insulin He has not having a large appetite and only occasionally has high sugars overnight when he takes Ensure without Humalog coverage Discussed that his freestyle Elenor Legato is reasonably  accurate   HYPERTENSION with microalbuminuria: Blood pressure is controlled with lisinopril 10 mg    PLAN:    We will reduce his Tyler Aas down to 20 since his fasting blood sugars in the last few days have been low normal He can take again 5 to 7 units Humalog for most of his meals except when eating more carbohydrate he can take more especially if Premeal hyperglycemia Continue Metformin for now since this is a low dose He can do fingersticks only if freestyle libre  indicates blood sugar below 80 or more than 200 since insulin will not be adjusted for other readings Increase [place] by 10 units when getting his chemo/steroids for at least 3 days Also on those days he will take double the amount of Humalog At least 3 units coverage for drinking Ensure at bedtime   Check results of lipid panel that are not available  Patient Instructions  3 Units For Ensure at nite  TRESIBA 20 AND with Chemo 30 units  More sugars at bedtime  Use Contour meter only if sugars <80 or >200          Elayne Snare 10/18/2020, 3:08 PM   Note: This office note was prepared with Dragon voice recognition system technology. Any transcriptional errors that result from this process are unintentional.

## 2020-10-17 NOTE — Patient Instructions (Addendum)
3 Units For Ensure at nite  TRESIBA 20 AND with Chemo 30 units  More sugars at bedtime  Use Contour meter only if sugars <80 or >200

## 2020-10-18 ENCOUNTER — Telehealth: Payer: Self-pay | Admitting: Internal Medicine

## 2020-10-18 ENCOUNTER — Encounter: Payer: Self-pay | Admitting: Internal Medicine

## 2020-10-18 ENCOUNTER — Telehealth (INDEPENDENT_AMBULATORY_CARE_PROVIDER_SITE_OTHER): Payer: Medicare Other | Admitting: Internal Medicine

## 2020-10-18 VITALS — BP 110/70 | HR 85 | Wt 248.0 lb

## 2020-10-18 DIAGNOSIS — E860 Dehydration: Secondary | ICD-10-CM | POA: Diagnosis not present

## 2020-10-18 DIAGNOSIS — I4891 Unspecified atrial fibrillation: Secondary | ICD-10-CM

## 2020-10-18 DIAGNOSIS — Z951 Presence of aortocoronary bypass graft: Secondary | ICD-10-CM

## 2020-10-18 DIAGNOSIS — R531 Weakness: Secondary | ICD-10-CM | POA: Diagnosis not present

## 2020-10-18 MED ORDER — APIXABAN 2.5 MG PO TABS
2.5000 mg | ORAL_TABLET | Freq: Two times a day (BID) | ORAL | Status: DC
Start: 2020-10-18 — End: 2020-10-18

## 2020-10-18 MED ORDER — APIXABAN 2.5 MG PO TABS
2.5000 mg | ORAL_TABLET | Freq: Two times a day (BID) | ORAL | 3 refills | Status: DC
Start: 2020-10-18 — End: 2021-03-11

## 2020-10-18 MED ORDER — ALLOPURINOL 100 MG PO TABS: 100.0000 mg | ORAL_TABLET | Freq: Every day | ORAL | 6 refills | Status: DC

## 2020-10-18 MED ORDER — FUROSEMIDE 40 MG PO TABS
40.0000 mg | ORAL_TABLET | Freq: Every morning | ORAL | 6 refills | Status: DC
Start: 2020-10-18 — End: 2021-02-21

## 2020-10-18 NOTE — Telephone Encounter (Signed)
Spoke with patient's spouse. Refill of Eliquis sent to preferred pharmacy for 90 days. No further questions.

## 2020-10-18 NOTE — Telephone Encounter (Signed)
° ° °  Pt c/o medication issue:  1. Name of Medication:apixaban (ELIQUIS) 2.5 MG TABS tablet  2. How are you currently taking this medication (dosage and times per day)? Take 1 tablet (2.5 mg total) by mouth 2 (two) times daily.  3. Are you having a reaction (difficulty breathing--STAT)?   4. What is your medication issue? Pt's wife said she went to pharmacy and able to pick up the allopurinol and lasix but not the eliquis. Per pharmacy they did not received refill for eliquis. Pt would like to re-send prescription at [facility], [place] - [address]

## 2020-10-18 NOTE — Patient Instructions (Addendum)
Medication Instructions:  DECREASE ELIQUIS 2.5MG  TWICE DAILY  DECREASE ALLOPURINOL 100MG  DAILY  DECREASE FUROSEMIDE 40MG  DAILY  *If you need a refill on your cardiac medications before your next appointment, please call your pharmacy*  Lab Work:   Testing/Procedures:  NONE    NONE  Follow-Up: Your next appointment:  3 month(s) In Person with K. Mali Hilty, MD   At [facility], you and your health needs are our priority.  As part of our continuing mission to provide you with exceptional heart care, we have created designated Provider Care Teams.  These Care Teams include your primary Cardiologist (physician) and Advanced Practice Providers (APPs -  Physician Assistants and Nurse Practitioners) who all work together to provide you with the care you need, when you need it.

## 2020-10-18 NOTE — Progress Notes (Signed)
Virtual Visit via Telephone Note   This visit type was conducted due to national recommendations for restrictions regarding the COVID-19 Pandemic (e.g. social distancing) in an effort to limit this patient's exposure and mitigate transmission in our community.  Due to his co-morbid illnesses, this patient is at least at moderate risk for complications without adequate follow up.  This format is felt to be most appropriate for this patient at this time.  The patient did not have access to video technology/had technical difficulties with video requiring transitioning to audio format only (telephone).  All issues noted in this document were discussed and addressed.  No physical exam could be performed with this format.  Please refer to the patient's chart for his  consent to telehealth for [facility]   Date:  10/18/2020   ID:  Randall Wilkins, DOB Jul 13, 1941, MRN [id number] The patient was identified using 2 identifiers.  Evaluation Performed:  Follow-Up Visit  Patient Location:  [place] [id number]  Provider location:   [address], [place], [place] [address]  PCP:  [name], MD  Cardiologist:  Pixie Casino, MD Electrophysiologist:  None   Chief Complaint:  Fatigue, recent hospitalization  History of Present Illness:    Randall Wilkins is a 79 y.o. male who presents via audio/video conferencing for a telehealth visit today.  [name] is seen today via telephone visit.  I spoke with him and his wife as he was just hospitalized with complications from chemotherapy.  Unfortunately had been diagnosed with stage IV lung cancer and has been undergoing chemotherapy for this.  He presented with acute on chronic renal failure, dehydration and GI bleeding requiring transfusion.  Creatinine initially was as high as 1.6-1.7 but improved with hydration down to 1.1.  After discharge however a few days ago his creatinine has gone up over 1.5.  His  hemoglobin was low at around 7 and transfused up but has drifted down.  He received additional transfusions through the cancer center.  He was told to hold his allopurinol and his aspirin was stopped.  His Eliquis was initially stopped they have resumed it at 5 mg twice daily for A. fib.  He still reports fatigue and weakness.    The patient does not have symptoms concerning for COVID-19 infection (fever, chills, cough, or new SHORTNESS OF BREATH).    Prior CV studies:   The following studies were reviewed today:  Chart reviewed including recent hospitalization  PMHx:  Past Medical History:  Diagnosis Date  . Aortic stenosis, mild 11/14/2013  . Aortic valve sclerosis 03/29/2015  . Bilateral leg edema 05/21/2014  . CAD (coronary artery disease)   . Chronic diastolic congestive heart failure ([place])   . Chronic kidney disease (CKD), stage III (moderate) (HCC)   . Diabetes 1.5, managed as type 1 ([place]) 02/04/2013  . Diabetic peripheral neuropathy associated with type 1 diabetes mellitus ([place]) 11/14/2013  . Dysphagia   . Dyspnea on exertion 03/21/2015  . Exogenous obesity   . Gout   . Heart attack ([place])   . History of nuclear stress test 07/2011   dipyridamole; fixed inferolateral defect, worse at stress than rest; no reversible ischemia; low risk scan   . Hyperlipidemia   . Hypertension   . Hypothyroidism   . Insulin dependent diabetes mellitus   . Left foot drop   . Left main coronary artery disease 03/28/2015  . Memory loss   . Obesity (BMI 30-39.9) 11/14/2013  .  Obstructive sleep apnea 03/21/2015  . OSA on CPAP    uses a cpap  . Peptic ulcer with hemorrhage 03/28/2015  . Peripheral neuropathy   . Pneumonia   . Rhabdomyolysis   . 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 Stroke ([place])    L patietal with small scattered lacunar infarcts  . Thrombocytopenia ([place]) 03/21/2015  . Venous insufficiency   . Weakness generalized 03/21/2015    Past Surgical  History:  Procedure Laterality Date  . BACK SURGERY  2002   lumbosacral. 11 back surgeries total  . BRONCHIAL BIOPSY  08/06/2020   Procedure: BRONCHIAL BIOPSIES;  Surgeon: Collene Gobble, MD;  Location: [facility] ENDOSCOPY;  Service: Pulmonary;;  . BRONCHIAL BRUSHINGS  08/06/2020   Procedure: BRONCHIAL BRUSHINGS;  Surgeon: Collene Gobble, MD;  Location[facility]COPY;  Service: Pulmonary;;  . BRONCHIAL NEEDLE ASPIRATION BIOPSY  08/06/2020   Procedure: BRONCHIAL NEEDLE ASPIRATION BIOPSIES;  Surgeon: Collene Gobble, MD;  Location: [facility] ENDOSCOPY;  Service: Pulmonary;;  . Carotid Doppler  03/2013   bilat bulb/prox ICAs - mild amount of fibrous plaque with no evidence of diameter reduction  . CARPAL TUNNEL RELEASE Bilateral 08/09/2014   Procedure: BILATERAL CARPAL TUNNEL RELEASE;  Surgeon: Daryll Brod, MD;  Location: [place];  Service: Orthopedics;  Laterality: Bilateral;  ANESTHESIA:  IV REGIONAL BIL FAB  . CHOLECYSTECTOMY    . COLONOSCOPY    . CORONARY ANGIOPLASTY  10/13/1996  . CORONARY ANGIOPLASTY  09/21/1989   emergency PTCA  . CORONARY ANGIOPLASTY  10/13/1996   Multi-Link diagonal & OD stenting (Dr. Marella Chimes)  . CORONARY ANGIOPLASTY  12/03/1997   disease of mid DX-1 ~50% & in mid PLA & PDA (distal lesions) (Dr. Marella Chimes)   . CORONARY ANGIOPLASTY  10/14/1999   progression of disease distal PLA & PDA; progression of disease prox RCA - moderate (Dr. Marella Chimes)   . CORONARY ANGIOPLASTY WITH STENT PLACEMENT  04/04/2004   4.0x21m non-DES (thrombectomy via AngioJet) to RCA for high grade stenosis (Dr. RMarella Chimes  . CORONARY ARTERY BYPASS GRAFT N/A 03/29/2015   Procedure: CORONARY ARTERY BYPASS GRAFTING TIMES TWO USING LEFT INTERNAL MAMMARY ARTERY AND RIGHT LEG GREATER SAPHENOUS VEIN HARVESTED ENDOSCOPICALLY.;  Surgeon: CRexene Alberts MD;  [place]  Service: Open Heart Surgery;  Laterality: N/A;  . ESOPHAGOGASTRODUODENOSCOPY N/A 03/27/2015   Procedure:  ESOPHAGOGASTRODUODENOSCOPY (EGD);  Surgeon: MClarene Essex MD;  [facility];  Service: Endoscopy;  Laterality: N/A;  possible dilation  . ESOPHAGOGASTRODUODENOSCOPY N/A 10/05/2020   Procedure: ESOPHAGOGASTRODUODENOSCOPY (EGD);  Surgeon: OArta Silence MD;  [facility];  Service: Endoscopy;  Laterality: N/A;  . FINE NEEDLE ASPIRATION  08/06/2020   Procedure: FINE NEEDLE ASPIRATION (FNA) LINEAR;  Surgeon: BCollene Gobble MD;  [facility];  Service: Pulmonary;;  . IR IMAGING GUIDED PORT INSERTION  10/07/2020  . LEFT HEART CATHETERIZATION WITH CORONARY ANGIOGRAM N/A 03/28/2015   Procedure: LEFT HEART CATHETERIZATION WITH CORONARY ANGIOGRAM;  Surgeon: Peter M JMartinique MD;  [facility];  Service: Cardiovascular;  Laterality: N/A;  . SINUS ENDO W/FUSION    . TONSILLECTOMY    . TRANSTHORACIC ECHOCARDIOGRAM  08/08/2013   EF 55-60%, mild conc hypertrophy, grade 1 diastolic dysfunction; AV with mild stenosis; LA & RA mildly dilated  . VIDEO BRONCHOSCOPY WITH ENDOBRONCHIAL NAVIGATION N/A 08/06/2020   Procedure: VIDEO BRONCHOSCOPY WITH ENDOBRONCHIAL NAVIGATION;  Surgeon: BCollene Gobble MD;  Location:  [place] ENDOSCOPY;  Service: Pulmonary;  Laterality: N/A;  . VIDEO BRONCHOSCOPY WITH ENDOBRONCHIAL ULTRASOUND N/A 08/06/2020   Procedure: VIDEO BRONCHOSCOPY WITH ENDOBRONCHIAL ULTRASOUND;  Surgeon: Collene Gobble, MD;  Location[facility] END[facility]COPY;  Service: Pulmonary;  Laterality: N/A;    FAMHx:  Family History  Problem Relation Age of Onset  . Heart disease Mother   . Coronary artery disease Father   . Cancer Maternal Grandmother   . Heart Problems Maternal Grandfather   . Diabetes Son        borderline     SOCHx:   reports that he quit smoking about 46 years ago. His smoking use included cigarettes. He has a 40.00 pack-year smoking history. He has never used smokeless tobacco. He reports that he does not drink alcohol and does not use drugs.  ALLERGIES:  Allergies   Allergen Reactions  . Actos [Pioglitazone] Swelling  . Metformin And Related Nausea Only  . Niaspan [Niacin Er] Itching and Rash    MEDS:  Current Meds  Medication Sig  . albuterol (VENTOLIN HFA) 108 (90 Base) MCG/ACT inhaler Inhale 2 puffs into the lungs every 6 (six) hours as needed for wheezing or shortness of breath.  Marland Kitchen amitriptyline (ELAVIL) 50 MG tablet Take 75 mg by mouth at bedtime.   Marland Kitchen apixaban (ELIQUIS) 5 MG TABS tablet Take 1 tablet (5 mg total) by mouth 2 (two) times daily.  . Blood Glucose Monitoring Suppl (CONTOUR NEXT MONITOR) w/Device KIT 1 Device by Does not apply route 3 (three) times daily. Use to check blood sugars 3 times daily. Dx Code E13.9  . CONTOUR NEXT TEST test strip USE 1 STRIP TO CHECK GLUCOSE 4 TIMES DAILY  . donepezil (ARICEPT) 10 MG tablet TAKE ONE TABLET DAILY.  PLEASE CALL [phone] TO SCHEDULE FOLLOW UP. (Patient taking differently: Take 10 mg by mouth daily. )  . feeding supplement, GLUCERNA SHAKE, (GLUCERNA SHAKE) LIQD Take 237 mLs by mouth 3 (three) times daily between meals.  . folic acid (FOLVITE) 1 MG tablet Take 1 tablet (1 mg total) by mouth daily.  . furosemide (LASIX) 40 MG tablet Take 80 mg by mouth in the morning.   . insulin degludec (TRESIBA FLEXTOUCH) 200 UNIT/ML FlexTouch Pen Inject 24 Units into the skin at bedtime. (Patient taking differently: Inject 20 Units into the skin at bedtime. 24 units on Chemo days)  . insulin lispro (HUMALOG) 100 UNIT/ML cartridge Inject 5-15 Units into the skin 3 (three) times daily with meals. Sliding scale.  . Insulin Pen Needle (EASY COMFORT PEN NEEDLES) 33G X 4 MM MISC 1 each by Does not apply route See admin instructions. Use to inject insulin 5 times daily.  Marland Kitchen levothyroxine (SYNTHROID, LEVOTHROID) 75 MCG tablet Take 75 mcg by mouth daily before breakfast.   . lidocaine (XYLOCAINE) 2 % solution Use as directed 15 mLs in the mouth or throat as needed for mouth pain.  Marland Kitchen lidocaine-prilocaine (EMLA) cream  Apply 1 application topically as needed. Apply a teaspoon over port site at least 1 hour prior to lab appt. Do not rub in and cover with plastic wrap.  . lisinopril (ZESTRIL) 10 MG tablet TAKE ONE TABLET DAILY (Patient taking differently: Take 10 mg by mouth daily. )  . metFORMIN (GLUCOPHAGE-XR) 750 MG 24 hr tablet TAKE 1 TABLET WITH DINNER (Patient taking differently: Take 750 mg by mouth daily. )  . metoprolol succinate (TOPROL-XL) 25 MG 24 hr tablet Take 1 tablet (25 mg total) by mouth daily.  . NON FORMULARY  Inhale 1 application into the lungs at bedtime. CPAP  . ondansetron (ZOFRAN) 4 MG tablet Take 1 tablet (4 mg total) by mouth every 6 (six) hours as needed for nausea.  . pantoprazole (PROTONIX) 40 MG tablet Take 1 tablet (40 mg total) by mouth 2 (two) times daily before a meal.  . potassium chloride SA (K-DUR,KLOR-CON) 20 MEQ tablet Take 20 mEq by mouth 2 (two) times daily.   . prochlorperazine (COMPAZINE) 10 MG tablet Take 1 tablet (10 mg total) by mouth every 6 (six) hours as needed for nausea or vomiting.  . rosuvastatin (CRESTOR) 20 MG tablet Take 20 mg by mouth daily.  . Tamsulosin HCl (FLOMAX) 0.4 MG CAPS Take 0.4 mg by mouth daily.   . vitamin B-12 (CYANOCOBALAMIN) 1000 MCG tablet Take 1,000 mcg by mouth daily.     ROS: Pertinent items noted in HPI and remainder of comprehensive ROS otherwise negative.  Labs/Other Tests and Data Reviewed:    Recent Labs: 09/30/2020: TSH 2.933 10/02/2020: B Natriuretic Peptide 344.4 10/08/2020: Magnesium 2.0 10/14/2020: ALT 13; BUN 20; Creatinine 1.56; Hemoglobin 7.1; Platelet Count 28; Potassium 4.2; Sodium 141   Recent Lipid Panel Lab Results  Component Value Date/Time   CHOL 98 05/23/2019 08:32 AM   TRIG 83.0 05/23/2019 08:32 AM   HDL 38.40 (L) 05/23/2019 08:32 AM   CHOLHDL 3 05/23/2019 08:32 AM   LDLCALC 43 05/23/2019 08:32 AM    Wt Readings from Last 3 Encounters:  10/18/20 248 lb (112.5 kg)  10/17/20 248 lb 6.4 oz (112.7 kg)   10/07/20 241 lb 10 oz (109.6 kg)     Exam:    Vital Signs:  BP 110/70   Pulse 85   Wt 248 lb (112.5 kg) Comment: at MD appt 10-17-20  BMI 38.84 kg/m    Exam not performed due to telephone visit  ASSESSMENT & PLAN:    1. Stage IV lung cancer 2. New onset atrial fibrillation - CHADSVASC score of 6 3. Recent multifocal pneumonia 4. Coronary artery disease status post BMS to the RCA in 2005, recent CABG 2 (LIMA to LAD - for critical left main disease, SVG to OM1)- 2016 5. Obesity 6. Hypertension-controlled 7. Dyslipidemia 8. Prior stroke 9. Diabetic neuropathy 10. Diabetic nephropathy - left foot drop 11. Type 1 diabetes on insulin 12. LE edema 13. Mild aortic stenosis 14. AKI on CKD 3B  [name] has been struggling with chemotherapy related to stage IV lung cancer.  He was just hospitalized and discharged about a week ago after having acute GI bleeding, dehydration and requiring transfusion.  Post hospital blood work in the beginning of November showed persistent anemia for which she has been transfused as well as a jump in his creatinine from 1.14 up to 1.56.  I suspect his lower creatinine numbers during the hospitalization were due to his hydration.  He was however restarted on 80 mg of Lasix.  His oral intake is decreased and his weight is declining.  I advised them to decrease his Lasix from 80 to 40 mg daily and we may be able to decrease it further.  In addition they advised him to stop his allopurinol however with diuresis he is at risk for developing gout, especially with transfusions.  He should restart it but would recommend a dose reduction from 300 to 100 mg daily given some chronic kidney disease.  Additionally, I agree with holding his aspirin and treating with Eliquis provided there is no evidence of recurrent bleeding.  Since  he was not tolerating the 5 mg twice daily dose and given the fact his age is near 41 (70) and creatinine now over 1.5, would recommend  decreasing the Eliquis dose to 2.5 mg twice daily.  He will have close lab work through the [facility] center.  Plan follow-up with me in 3 months.  COVID-19 Education: The signs and symptoms of COVID-19 were discussed with the patient and how to seek care for testing (follow up with PCP or arrange E-visit).  The importance of social distancing was discussed today.  Patient Risk:   After full review of this patients clinical status, I feel that they are at least moderate risk at this time.  Time:   Today, I have spent 25 minutes with the patient with telehealth technology discussing recent acute renal failure, GI bleeding, anemia, gout prevention, anticoagulation with regards to A. fib.     Medication Adjustments/Labs and Tests Ordered: Current medicines are reviewed at length with the patient today.  Concerns regarding medicines are outlined above.   Tests Ordered: No orders of the defined types were placed in this encounter.   Medication Changes: No orders of the defined types were placed in this encounter.   Disposition:  in 3 month(s)  Pixie Casino, MD, [facility], [place] Director of the Advanced Lipid Disorders &  Cardiovascular Risk Reduction Clinic Diplomate of the American Board of Clinical Lipidology Attending Cardiologist  Direct Dial: [phone]  Fax: [phone]  Website:  [email or web address]  Pixie Casino, MD  10/18/2020 8:26 AM

## 2020-10-21 ENCOUNTER — Inpatient Hospital Stay (HOSPITAL_BASED_OUTPATIENT_CLINIC_OR_DEPARTMENT_OTHER): Payer: Medicare Other | Admitting: Internal Medicine

## 2020-10-21 ENCOUNTER — Inpatient Hospital Stay: Payer: Medicare Other

## 2020-10-21 ENCOUNTER — Inpatient Hospital Stay: Payer: Medicare Other | Admitting: Nutrition

## 2020-10-21 ENCOUNTER — Other Ambulatory Visit: Payer: Self-pay

## 2020-10-21 ENCOUNTER — Encounter: Payer: Self-pay | Admitting: Internal Medicine

## 2020-10-21 VITALS — BP 132/75 | HR 100 | Temp 98.9°F | Resp 13 | Ht 67.0 in | Wt 240.0 lb

## 2020-10-21 VITALS — BP 120/55 | HR 87 | Resp 16

## 2020-10-21 DIAGNOSIS — D6481 Anemia due to antineoplastic chemotherapy: Secondary | ICD-10-CM | POA: Diagnosis not present

## 2020-10-21 DIAGNOSIS — I10 Essential (primary) hypertension: Secondary | ICD-10-CM | POA: Diagnosis not present

## 2020-10-21 DIAGNOSIS — C3491 Malignant neoplasm of unspecified part of right bronchus or lung: Secondary | ICD-10-CM | POA: Diagnosis not present

## 2020-10-21 DIAGNOSIS — C3490 Malignant neoplasm of unspecified part of unspecified bronchus or lung: Secondary | ICD-10-CM

## 2020-10-21 DIAGNOSIS — C3431 Malignant neoplasm of lower lobe, right bronchus or lung: Secondary | ICD-10-CM | POA: Diagnosis not present

## 2020-10-21 DIAGNOSIS — Z5111 Encounter for antineoplastic chemotherapy: Secondary | ICD-10-CM | POA: Diagnosis not present

## 2020-10-21 DIAGNOSIS — C781 Secondary malignant neoplasm of mediastinum: Secondary | ICD-10-CM | POA: Diagnosis not present

## 2020-10-21 DIAGNOSIS — Z5112 Encounter for antineoplastic immunotherapy: Secondary | ICD-10-CM

## 2020-10-21 DIAGNOSIS — C3412 Malignant neoplasm of upper lobe, left bronchus or lung: Secondary | ICD-10-CM | POA: Diagnosis not present

## 2020-10-21 DIAGNOSIS — Z79899 Other long term (current) drug therapy: Secondary | ICD-10-CM | POA: Diagnosis not present

## 2020-10-21 LAB — CBC WITH DIFFERENTIAL (CANCER CENTER ONLY)
Abs Immature Granulocytes: 2.67 10*3/uL — ABNORMAL HIGH (ref 0.00–0.07)
Basophils Absolute: 0 10*3/uL (ref 0.0–0.1)
Basophils Relative: 0 %
Eosinophils Absolute: 0.1 10*3/uL (ref 0.0–0.5)
Eosinophils Relative: 0 %
HCT: 33.6 % — ABNORMAL LOW (ref 39.0–52.0)
Hemoglobin: 10.6 g/dL — ABNORMAL LOW (ref 13.0–17.0)
Immature Granulocytes: 21 %
Lymphocytes Relative: 12 %
Lymphs Abs: 1.5 10*3/uL (ref 0.7–4.0)
MCH: 29.7 pg (ref 26.0–34.0)
MCHC: 31.5 g/dL (ref 30.0–36.0)
MCV: 94.1 fL (ref 80.0–100.0)
Monocytes Absolute: 2.3 10*3/uL — ABNORMAL HIGH (ref 0.1–1.0)
Monocytes Relative: 18 %
Neutro Abs: 6.1 10*3/uL (ref 1.7–7.7)
Neutrophils Relative %: 49 %
Platelet Count: 184 10*3/uL (ref 150–400)
RBC: 3.57 MIL/uL — ABNORMAL LOW (ref 4.22–5.81)
RDW: 18.8 % — ABNORMAL HIGH (ref 11.5–15.5)
WBC Count: 12.6 10*3/uL — ABNORMAL HIGH (ref 4.0–10.5)
nRBC: 0.6 % — ABNORMAL HIGH (ref 0.0–0.2)

## 2020-10-21 LAB — CMP (CANCER CENTER ONLY)
ALT: 11 U/L (ref 0–44)
AST: 19 U/L (ref 15–41)
Albumin: 2.7 g/dL — ABNORMAL LOW (ref 3.5–5.0)
Alkaline Phosphatase: 100 U/L (ref 38–126)
Anion gap: 11 (ref 5–15)
BUN: 16 mg/dL (ref 8–23)
CO2: 26 mmol/L (ref 22–32)
Calcium: 8.6 mg/dL — ABNORMAL LOW (ref 8.9–10.3)
Chloride: 105 mmol/L (ref 98–111)
Creatinine: 1.78 mg/dL — ABNORMAL HIGH (ref 0.61–1.24)
GFR, Estimated: 38 mL/min — ABNORMAL LOW (ref >60)
Glucose, Bld: 205 mg/dL — ABNORMAL HIGH (ref 70–99)
Potassium: 4.5 mmol/L (ref 3.5–5.1)
Sodium: 142 mmol/L (ref 135–145)
Total Bilirubin: 0.5 mg/dL (ref 0.3–1.2)
Total Protein: 5.8 g/dL — ABNORMAL LOW (ref 6.5–8.1)

## 2020-10-21 LAB — TSH: TSH: 4.374 u[IU]/mL — ABNORMAL HIGH (ref 0.320–4.118)

## 2020-10-21 MED ORDER — SODIUM CHLORIDE 0.9 % IV SOLN
10.0000 mg | Freq: Once | INTRAVENOUS | Status: AC
  Administered 2020-10-21: 10 mg via INTRAVENOUS
  Filled 2020-10-21: qty 10

## 2020-10-21 MED ORDER — SODIUM CHLORIDE 0.9 % IV SOLN
Freq: Once | INTRAVENOUS | Status: AC
  Filled 2020-10-21: qty 250

## 2020-10-21 MED ORDER — PALONOSETRON HCL INJECTION 0.25 MG/5ML
INTRAVENOUS | Status: AC
  Filled 2020-10-21: qty 5

## 2020-10-21 MED ORDER — PALONOSETRON HCL INJECTION 0.25 MG/5ML
0.2500 mg | Freq: Once | INTRAVENOUS | Status: AC
  Administered 2020-10-21: 0.25 mg via INTRAVENOUS

## 2020-10-21 MED ORDER — SODIUM CHLORIDE 0.9 % IV SOLN
200.0000 mg | Freq: Once | INTRAVENOUS | Status: AC
  Administered 2020-10-21: 200 mg via INTRAVENOUS
  Filled 2020-10-21: qty 8

## 2020-10-21 MED ORDER — SODIUM CHLORIDE 0.9% FLUSH
10.0000 mL | INTRAVENOUS | Status: DC | PRN
  Administered 2020-10-21: 10 mL
  Filled 2020-10-21: qty 10

## 2020-10-21 MED ORDER — HEPARIN SOD (PORK) LOCK FLUSH 100 UNIT/ML IV SOLN
500.0000 [IU] | Freq: Once | INTRAVENOUS | Status: AC | PRN
  Administered 2020-10-21: 500 [IU]
  Filled 2020-10-21: qty 5

## 2020-10-21 MED ORDER — CYANOCOBALAMIN 1000 MCG/ML IJ SOLN
INTRAMUSCULAR | Status: AC
  Filled 2020-10-21: qty 1

## 2020-10-21 MED ORDER — CYANOCOBALAMIN 1000 MCG/ML IJ SOLN
1000.0000 ug | Freq: Once | INTRAMUSCULAR | Status: AC
  Administered 2020-10-21: 1000 ug via INTRAMUSCULAR

## 2020-10-21 MED ORDER — SODIUM CHLORIDE 0.9 % IV SOLN
310.0000 mg | Freq: Once | INTRAVENOUS | Status: AC
  Administered 2020-10-21: 310 mg via INTRAVENOUS
  Filled 2020-10-21: qty 31

## 2020-10-21 MED ORDER — SODIUM CHLORIDE 0.9 % IV SOLN
150.0000 mg | Freq: Once | INTRAVENOUS | Status: AC
  Administered 2020-10-21: 150 mg via INTRAVENOUS
  Filled 2020-10-21: qty 150

## 2020-10-21 NOTE — Progress Notes (Signed)
Per Dr. Julien Nordmann, pt okay to treat with Carbo and Keytruda with creatinine of 1.78, however, pt will not receive Alimta today.

## 2020-10-21 NOTE — Progress Notes (Signed)
[name] [phone]   [phone]  OFFICE PROGRESS NOTE  [name], MD [address] [place]  [place] [address]  DIAGNOSIS: Stage IV (T4, N2, M1 a) non-small cell lung cancer, mucinous adenocarcinoma presented with bilateral large masses in the left upper lobe as well as the right lower lobe in addition to other small nodules in the right upper lobe with hilar and mediastinal lymphadenopathy diagnosed in August 2021.  Biomarker Findings Microsatellite status - MS-Stable Tumor Mutational Burden - 4 Muts/Mb Genomic Findings For a complete list of the genes assayed, please refer to the Appendix. KRAS G12C GNAS R201H 7 Disease relevant genes with no reportable alterations: ALK, BRAF, EGFR, ERBB2, MET, RET, ROS1  PDL1 Expression: 20%.  PRIOR THERAPY: None  CURRENT THERAPY: Systemic chemotherapy with carboplatin for AUC of 5, Alimta 500 mg/M2 and Keytruda 200 mg IV every 3 weeks.  First dose 09/09/2020.  Status post 2 cycles.   INTERVAL HISTORY: Randall Wilkins 79 y.o. male returns to the clinic today for follow-up visit.  The patient is feeling fine today with no concerning complaints.  He was admitted to the hospital recently with dehydration and also anemia secondary to gastrointestinal blood loss.  He was seen by his gastroenterologist and had upper endoscopy that showed some bleeding areas.  He started on Protonix.  He denied having any current chest pain, shortness of breath, cough or hemoptysis.  He denied having any fever or chills.  He has no nausea, vomiting, diarrhea or constipation.  He denied having any headache or visual changes.  Is here today for evaluation before starting cycle #3 of his treatment.   MEDICAL HISTORY: Past Medical History:  Diagnosis Date  . Aortic stenosis, mild 11/14/2013  . Aortic valve sclerosis 03/29/2015  . Bilateral leg edema 05/21/2014  . CAD (coronary artery disease)   . Chronic diastolic  congestive heart failure ([place])   . Chronic kidney disease (CKD), stage III (moderate) (HCC)   . Diabetes 1.5, managed as type 1 ([place]) 02/04/2013  . Diabetic peripheral neuropathy associated with type 1 diabetes mellitus ([place]) 11/14/2013  . Dysphagia   . Dyspnea on exertion 03/21/2015  . Exogenous obesity   . Gout   . Heart attack ([place])   . History of nuclear stress test 07/2011   dipyridamole; fixed inferolateral defect, worse at stress than rest; no reversible ischemia; low risk scan   . Hyperlipidemia   . Hypertension   . Hypothyroidism   . Insulin dependent diabetes mellitus   . Left foot drop   . Left main coronary artery disease 03/28/2015  . Memory loss   . Obesity (BMI 30-39.9) 11/14/2013  . Obstructive sleep apnea 03/21/2015  . OSA on CPAP    uses a cpap  . Peptic ulcer with hemorrhage 03/28/2015  . Peripheral neuropathy   . Pneumonia   . Rhabdomyolysis   . 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 Stroke ([place])    L patietal with small scattered lacunar infarcts  . Thrombocytopenia ([place]) 03/21/2015  . Venous insufficiency   . Weakness generalized 03/21/2015    ALLERGIES:  is allergic to actos [pioglitazone], metformin and related, and niaspan [niacin er].  MEDICATIONS:  Current Outpatient Medications  Medication Sig Dispense Refill  . albuterol (VENTOLIN HFA) 108 (90 Base) MCG/ACT inhaler Inhale 2 puffs into the lungs every 6 (six) hours as needed for wheezing  or shortness of breath. 8 g 6  . allopurinol (ZYLOPRIM) 100 MG tablet Take 1 tablet (100 mg total) by mouth daily. 30 tablet 6  . amitriptyline (ELAVIL) 50 MG tablet Take 75 mg by mouth at bedtime.     Marland Kitchen apixaban (ELIQUIS) 2.5 MG TABS tablet Take 1 tablet (2.5 mg total) by mouth 2 (two) times daily. 180 tablet 3  . Blood Glucose Monitoring Suppl (CONTOUR NEXT MONITOR) w/Device KIT 1 Device by Does not apply route 3 (three) times daily. Use to check blood sugars 3 times daily. Dx Code  E13.9 1 kit 2  . CONTOUR NEXT TEST test strip USE 1 STRIP TO CHECK GLUCOSE 4 TIMES DAILY 300 each 1  . donepezil (ARICEPT) 10 MG tablet TAKE ONE TABLET DAILY.  PLEASE CALL [phone] TO SCHEDULE FOLLOW UP. (Patient taking differently: Take 10 mg by mouth daily. ) 90 tablet 0  . feeding supplement, GLUCERNA SHAKE, (GLUCERNA SHAKE) LIQD Take 237 mLs by mouth 3 (three) times daily between meals. 2370 mL 0  . folic acid (FOLVITE) 1 MG tablet Take 1 tablet (1 mg total) by mouth daily. 30 tablet 4  . furosemide (LASIX) 40 MG tablet Take 1 tablet (40 mg total) by mouth in the morning. 30 tablet 6  . insulin degludec (TRESIBA FLEXTOUCH) 200 UNIT/ML FlexTouch Pen Inject 24 Units into the skin at bedtime. (Patient taking differently: Inject 20 Units into the skin at bedtime. 24 units on Chemo days)    . insulin lispro (HUMALOG) 100 UNIT/ML cartridge Inject 5-15 Units into the skin 3 (three) times daily with meals. Sliding scale.    . Insulin Pen Needle (EASY COMFORT PEN NEEDLES) 33G X 4 MM MISC 1 each by Does not apply route See admin instructions. Use to inject insulin 5 times daily. 150 each 3  . levothyroxine (SYNTHROID, LEVOTHROID) 75 MCG tablet Take 75 mcg by mouth daily before breakfast.   1  . lidocaine (XYLOCAINE) 2 % solution Use as directed 15 mLs in the mouth or throat as needed for mouth pain. 100 mL 0  . lidocaine-prilocaine (EMLA) cream Apply 1 application topically as needed. Apply a teaspoon over port site at least 1 hour prior to lab appt. Do not rub in and cover with plastic wrap. 30 g 0  . lisinopril (ZESTRIL) 10 MG tablet TAKE ONE TABLET DAILY (Patient taking differently: Take 10 mg by mouth daily. ) 90 tablet 2  . metFORMIN (GLUCOPHAGE-XR) 750 MG 24 hr tablet TAKE 1 TABLET WITH DINNER (Patient taking differently: Take 750 mg by mouth daily. ) 90 tablet 0  . metoprolol succinate (TOPROL-XL) 25 MG 24 hr tablet Take 1 tablet (25 mg total) by mouth daily. 90 tablet 3  . NON FORMULARY Inhale 1  application into the lungs at bedtime. CPAP    . ondansetron (ZOFRAN) 4 MG tablet Take 1 tablet (4 mg total) by mouth every 6 (six) hours as needed for nausea. 20 tablet 0  . pantoprazole (PROTONIX) 40 MG tablet Take 1 tablet (40 mg total) by mouth 2 (two) times daily before a meal. 60 tablet 0  . potassium chloride SA (K-DUR,KLOR-CON) 20 MEQ tablet Take 20 mEq by mouth 2 (two) times daily.     . prochlorperazine (COMPAZINE) 10 MG tablet Take 1 tablet (10 mg total) by mouth every 6 (six) hours as needed for nausea or vomiting. 30 tablet 0  . rosuvastatin (CRESTOR) 20 MG tablet Take 20 mg by mouth daily.    . Tamsulosin  HCl (FLOMAX) 0.4 MG CAPS Take 0.4 mg by mouth daily.     . vitamin B-12 (CYANOCOBALAMIN) 1000 MCG tablet Take 1,000 mcg by mouth daily.     No current facility-administered medications for this visit.    SURGICAL HISTORY:  Past Surgical History:  Procedure Laterality Date  . BACK SURGERY  2002   lumbosacral. 11 back surgeries total  . BRONCHIAL BIOPSY  08/06/2020   Procedure: BRONCHIAL BIOPSIES;  Surgeon: Collene Gobble, MD;  Location: [facility] ENDOSCOPY;  Service: Pulmonary;;  . BRONCHIAL BRUSHINGS  08/06/2020   Procedure: BRONCHIAL BRUSHINGS;  Surgeon: Collene Gobble, MD;  Location: [facility] ENDOSCOPY;  Service: Pulmonary;;  . BRONCHIAL NEEDLE ASPIRATION BIOPSY  08/06/2020   Procedure: BRONCHIAL NEEDLE ASPIRATION BIOPSIES;  Surgeon: Collene Gobble, MD;  Location: [facility] ENDOSCOPY;  Service: Pulmonary;;  . Carotid Doppler  03/2013   bilat bulb/prox ICAs - mild amount of fibrous plaque with no evidence of diameter reduction  . CARPAL TUNNEL RELEASE Bilateral 08/09/2014   Procedure: BILATERAL CARPAL TUNNEL RELEASE;  Surgeon: Daryll Brod, MD;  Location: [facility];  Service: Orthopedics;  Laterality: Bilateral;  ANESTHESIA:  IV REGIONAL BIL FAB  . CHOLECYSTECTOMY    . COLONOSCOPY    . CORONARY ANGIOPLASTY  10/13/1996  . CORONARY ANGIOPLASTY  09/21/1989   emergency PTCA  .  CORONARY ANGIOPLASTY  10/13/1996   Multi-Link diagonal & OD stenting (Dr. Marella Chimes)  . CORONARY ANGIOPLASTY  12/03/1997   disease of mid DX-1 ~50% & in mid PLA & PDA (distal lesions) (Dr. Marella Chimes)   . CORONARY ANGIOPLASTY  10/14/1999   progression of disease distal PLA & PDA; progression of disease prox RCA - moderate (Dr. Marella Chimes)   . CORONARY ANGIOPLASTY WITH STENT PLACEMENT  04/04/2004   4.0x33mm non-DES (thrombectomy via AngioJet) to RCA for high grade stenosis (Dr. Marella Chimes)  . CORONARY ARTERY BYPASS GRAFT N/A 03/29/2015   Procedure: CORONARY ARTERY BYPASS GRAFTING TIMES TWO USING LEFT INTERNAL MAMMARY ARTERY AND RIGHT LEG GREATER SAPHENOUS VEIN HARVESTED ENDOSCOPICALLY.;  Surgeon: Rexene Alberts, MD;  Location: [facility];  Service: Open Heart Surgery;  Laterality: N/A;  . ESOPHAGOGASTRODUODENOSCOPY N/A 03/27/2015   Procedure: ESOPHAGOGASTRODUODENOSCOPY (EGD);  Surgeon: Clarene Essex, MD;  Location: [facility] ENDOSCOPY;  Service: Endoscopy;  Laterality: N/A;  possible dilation  . ESOPHAGOGASTRODUODENOSCOPY N/A 10/05/2020   Procedure: ESOPHAGOGASTRODUODENOSCOPY (EGD);  Surgeon: Arta Silence, MD;  Location: [facility] ENDOSCOPY;  Service: Endoscopy;  Laterality: N/A;  . FINE NEEDLE ASPIRATION  08/06/2020   Procedure: FINE NEEDLE ASPIRATION (FNA) LINEAR;  Surgeon: Collene Gobble, MD;  Location: [facility] ENDOSCOPY;  Service: Pulmonary;;  . IR IMAGING GUIDED PORT INSERTION  10/07/2020  . LEFT HEART CATHETERIZATION WITH CORONARY ANGIOGRAM N/A 03/28/2015   Procedure: LEFT HEART CATHETERIZATION WITH CORONARY ANGIOGRAM;  Surgeon: Peter M Martinique, MD;  Location: [facility] CATH LAB;  Service: Cardiovascular;  Laterality: N/A;  . SINUS ENDO W/FUSION    . TONSILLECTOMY    . TRANSTHORACIC ECHOCARDIOGRAM  08/08/2013   EF 55-60%, mild conc hypertrophy, grade 1 diastolic dysfunction; AV with mild stenosis; LA & RA mildly dilated  . VIDEO BRONCHOSCOPY WITH ENDOBRONCHIAL NAVIGATION N/A 08/06/2020   Procedure: VIDEO  BRONCHOSCOPY WITH ENDOBRONCHIAL NAVIGATION;  Surgeon: Collene Gobble, MD;  Location: [facility] ENDOSCOPY;  Service: Pulmonary;  Laterality: N/A;  . VIDEO BRONCHOSCOPY WITH ENDOBRONCHIAL ULTRASOUND N/A 08/06/2020   Procedure: VIDEO BRONCHOSCOPY WITH ENDOBRONCHIAL ULTRASOUND;  Surgeon: Collene Gobble, MD;  Location: [facility] ENDOSCOPY;  Service: Pulmonary;  Laterality: N/A;  REVIEW OF SYSTEMS:  A comprehensive review of systems was negative except for: Constitutional: positive for fatigue   PHYSICAL EXAMINATION: General appearance: alert, cooperative, fatigued and no distress Head: Normocephalic, without obvious abnormality, atraumatic Neck: no adenopathy, no JVD, supple, symmetrical, trachea midline and thyroid not enlarged, symmetric, no tenderness/mass/nodules Lymph nodes: Cervical, supraclavicular, and axillary nodes normal. Resp: clear to auscultation bilaterally Back: symmetric, no curvature. ROM normal. No CVA tenderness. Cardio: regular rate and rhythm, S1, S2 normal, no murmur, click, rub or gallop GI: soft, non-tender; bowel sounds normal; no masses,  no organomegaly Extremities: extremities normal, atraumatic, no cyanosis or edema  ECOG PERFORMANCE STATUS: 1 - Symptomatic but completely ambulatory  Blood pressure 132/75, pulse 100, temperature 98.9 F (37.2 C), temperature source Tympanic, resp. rate 13, height $RemoveBe'5\' 7"'[id number]$  (1.702 m), weight 240 lb (108.9 kg), SpO2 96 %.  LABORATORY DATA: Lab Results  Component Value Date   WBC 12.6 (H) 10/21/2020   HGB 10.6 (L) 10/21/2020   HCT 33.6 (L) 10/21/2020   MCV 94.1 10/21/2020   PLT 184 10/21/2020      Chemistry      Component Value Date/Time   NA 141 10/14/2020 1016   K 4.2 10/14/2020 1016   CL 104 10/14/2020 1016   CO2 27 10/14/2020 1016   BUN 20 10/14/2020 1016   CREATININE 1.56 (H) 10/14/2020 1016   CREATININE 1.30 (H) 04/30/2020 1044      Component Value Date/Time   CALCIUM 8.6 (L) 10/14/2020 1016   ALKPHOS 87 10/14/2020 1016    AST 16 10/14/2020 1016   ALT 13 10/14/2020 1016   BILITOT 0.5 10/14/2020 1016       RADIOGRAPHIC STUDIES: DG Chest Portable 1 View  Result Date: 10/02/2020 CLINICAL DATA:  History of known lung cancer with weakness, initial encounter EXAM: PORTABLE CHEST 1 VIEW COMPARISON:  PET-CT from 08/22/2020 FINDINGS: Cardiac shadow is stable. Postsurgical changes are again seen. Bilateral soft tissue lung masses are noted consistent with the given clinical history. Elevation the right hemidiaphragm is again noted. No sizable effusion or pneumothorax is seen. No acute bony abnormality is noted. IMPRESSION: Bilateral masses consistent with the given clinical history of lung carcinoma. No acute abnormality noted. Electronically Signed   By: Inez Catalina M.D.   On: 10/02/2020 17:51   IR IMAGING GUIDED PORT INSERTION  Result Date: 10/07/2020 INDICATION: 79 year old male with advanced stage lung cancer requiring central venous access for chemotherapy. EXAM: IMPLANTED PORT A CATH PLACEMENT WITH ULTRASOUND AND FLUOROSCOPIC GUIDANCE COMPARISON:  PET-CT from 08/22/2020 MEDICATIONS: Ancef 2 gm IV; The antibiotic was administered within an appropriate time interval prior to skin puncture. ANESTHESIA/SEDATION: Moderate (conscious) sedation was employed during this procedure. A total of Versed 2 mg and Fentanyl 100 mcg was administered intravenously. Moderate Sedation Time: 20 minutes. The patient's level of consciousness and vital signs were monitored continuously by radiology nursing throughout the procedure under my direct supervision. CONTRAST:  None FLUOROSCOPY TIME:  0 minutes, 36 seconds (18 mGy) COMPLICATIONS: None immediate. PROCEDURE: The procedure, risks, benefits, and alternatives were explained to the patient. Questions regarding the procedure were encouraged and answered. The patient understands and consents to the procedure. The right neck and chest were prepped with chlorhexidine in a sterile fashion, and a  sterile drape was applied covering the operative field. Maximum barrier sterile technique with sterile gowns and gloves were used for the procedure. A timeout was performed prior to the initiation of the procedure. Ultrasound was used to examine the jugular vein  which was compressible and free of internal echoes. A skin marker was used to demarcate the planned venotomy and port pocket incision sites. Local anesthesia was provided to these sites and the subcutaneous tunnel track with 1% lidocaine with 1:100,000 epinephrine. A small incision was created at the jugular access site and blunt dissection was performed of the subcutaneous tissues. Under real time ultrasound guidance, the jugular vein was accessed with a 21 ga micropuncture needle and an 0.018" wire was inserted to the superior vena cava. A 5 Fr micopuncture set was then used, through which a 0.035" Rosen wire was passed under fluoroscopic guidance into the inferior vena cava. An 8 Fr dilator was then placed over the wire. A subcutaneous port pocket was then created along the upper chest wall utilizing a combination of sharp and blunt dissection. The pocket was irrigated with sterile saline, packed with gauze, and observed for hemorrhage. A single lumen "ISP" sized power injectable port was chosen for placement. The 8 Fr catheter was tunneled from the port pocket site to the venotomy incision. The port was placed in the pocket. The external catheter was trimmed to appropriate length. The dilator was exchanged for an 8 Fr peel-away sheath under fluoroscopic guidance. The catheter was then placed through the sheath and the sheath was removed. Final catheter positioning was confirmed and documented with a fluoroscopic spot radiograph. The port was accessed with a Huber needle, aspirated, and flushed with heparinized saline. The deep dermal layer of the port pocket incision was closed with interrupted 3-0 Vicryl suture. Dermabond was then placed over the port  pocket and neck incisions. The patient tolerated the procedure well without immediate post procedural complication. FINDINGS: After catheter placement, the tip lies within the cavoatrial junction the catheter aspirates and flushes normally and is ready for immediate use. IMPRESSION: Successful placement of a power injectable Port-A-Cath via the right internal jugular vein. The catheter is ready for immediate use. Ruthann Cancer, MD [facility] Radiology Electronically Signed   By: Ruthann Cancer MD   On: 10/07/2020 16:26    ASSESSMENT AND PLAN: This is a very pleasant 79 years old white male diagnosed with a stage IV (T4, N2, M1 a) non-small cell lung cancer, mucinous adenocarcinoma presented with bilateral large masses in the left upper lobe as well as the right upper lobe in addition to other pulmonary nodules in the right upper lobe with hilar and mediastinal lymphadenopathy diagnosed in August 2021. Molecular study showed positive KRAS G12C mutation and PD-L1 expression of 20%. The patient is currently undergoing systemic chemotherapy with carboplatin for AUC of 5, Alimta 500 mg/M2 and Keytruda 200 mg IV every 3 weeks status post 2 cycles. The patient has been tolerating his treatment well with no concerning adverse effects except for fatigue secondary to anemia from gastrointestinal blood loss as well as chemotherapy-induced anemia. The patient received PRBCs transfusion few times recently. I recommended for him to proceed with cycle #3 today as planned but I will reduce the dose of carboplatin to AUC of 4 and I will discontinue Alimta the cycle because of the renal insufficiency.  He will come back for follow-up visit in 3 weeks for evaluation with repeat CT scan of the chest, abdomen pelvis for restaging of his disease. The patient was advised to call immediately if he has any concerning symptoms in the interval. The patient voices understanding of  current disease status and treatment options and is in agreement with the current care plan.  All questions were answered. The patient knows to call the clinic with any problems, questions or concerns. We can certainly see the patient much sooner if necessary.  Disclaimer: This note was dictated with voice recognition software. Similar sounding words can inadvertently be transcribed and may not be corrected upon review.

## 2020-10-21 NOTE — Patient Instructions (Signed)
[facility] Discharge Instructions for Patients Receiving Chemotherapy  Today you received the following chemotherapy agents: Keytruda, carboplatin  To help prevent nausea and vomiting after your treatment, we encourage you to take your nausea medication as directed.   If you develop nausea and vomiting that is not controlled by your nausea medication, call the clinic.   BELOW ARE SYMPTOMS THAT SHOULD BE REPORTED IMMEDIATELY:  *FEVER GREATER THAN 100.5 F  *CHILLS WITH OR WITHOUT FEVER  NAUSEA AND VOMITING THAT IS NOT CONTROLLED WITH YOUR NAUSEA MEDICATION  *UNUSUAL SHORTNESS OF BREATH  *UNUSUAL BRUISING OR BLEEDING  TENDERNESS IN MOUTH AND THROAT WITH OR WITHOUT PRESENCE OF ULCERS  *URINARY PROBLEMS  *BOWEL PROBLEMS  UNUSUAL RASH Items with * indicate a potential emergency and should be followed up as soon as possible.  Feel free to call the clinic should you have any questions or concerns. The clinic phone number is [phone].  Please show the [facility] at check-in to the Emergency Department and triage nurse.

## 2020-10-21 NOTE — Progress Notes (Signed)
Patient was identified to be at risk for malnutrition on the MST secondary to poor appetite and weight loss.  79 year old male diagnosed with lung cancer followed by Dr. Julien Nordmann.  Past medical history includes stroke, CABG x2, insulin-dependent diabetes, hypertension, hyperlipidemia, MI, gout, dysphagia, chronic kidney disease, and bilateral edema.  Medications include Lasix, Humalog, Victoza, Glucophage, Zofran, Protonix, Compazine, and vitamin B12.  Labs include albumin 2.6.  Height: 5 feet 7 inches. Weight:  244 pounds October 18,  241.6 pounds October 25.   Patient weighed 248 pounds today.   BMI: 38.84.  Noted patient with recent weight gain. He reports he has continued to have a poor appetite. He currently denies nausea, vomiting, diarrhea, and constipation. Reports he saw his doctor who manages his diabetes last week.  Reports things have improved. He is drinking some type of Ensure but patient cannot tell me which kind.  Reports he is consuming 3 every day. 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  Intervention: Educated patient on the importance of small, frequent meals and snacks with adequate calories and protein for weight maintenance. Encouraged Ensure between meals for weight maintenance.  I provided coupons. Provided fact sheets.  Contact information given.  Questions and concerns were addressed.  Monitoring, evaluation, goals: Patient will tolerate adequate calories and protein to minimize weight loss throughout treatment.  Next visit: To be scheduled as needed with treatment.  He has my contact information if he develops questions or concerns.  **Disclaimer: This note was dictated with voice recognition software. Similar sounding words can inadvertently be transcribed and this note may contain transcription  errors which may not have been corrected upon publication of note.**

## 2020-10-23 ENCOUNTER — Telehealth: Payer: Self-pay | Admitting: Internal Medicine

## 2020-10-23 DIAGNOSIS — E1129 Type 2 diabetes mellitus with other diabetic kidney complication: Secondary | ICD-10-CM | POA: Diagnosis not present

## 2020-10-23 DIAGNOSIS — I10 Essential (primary) hypertension: Secondary | ICD-10-CM | POA: Diagnosis not present

## 2020-10-23 DIAGNOSIS — N183 Chronic kidney disease, stage 3 unspecified: Secondary | ICD-10-CM | POA: Diagnosis not present

## 2020-10-23 DIAGNOSIS — E1149 Type 2 diabetes mellitus with other diabetic neurological complication: Secondary | ICD-10-CM | POA: Diagnosis not present

## 2020-10-23 NOTE — Telephone Encounter (Signed)
Scheduled per los. Called and left msg. Mailed printout  °

## 2020-10-28 ENCOUNTER — Inpatient Hospital Stay: Payer: Medicare Other

## 2020-10-28 ENCOUNTER — Other Ambulatory Visit: Payer: Self-pay

## 2020-10-28 DIAGNOSIS — C3491 Malignant neoplasm of unspecified part of right bronchus or lung: Secondary | ICD-10-CM

## 2020-10-28 DIAGNOSIS — C3412 Malignant neoplasm of upper lobe, left bronchus or lung: Secondary | ICD-10-CM | POA: Diagnosis not present

## 2020-10-28 LAB — CBC WITH DIFFERENTIAL (CANCER CENTER ONLY)
Abs Immature Granulocytes: 0.03 10*3/uL (ref 0.00–0.07)
Basophils Absolute: 0.1 10*3/uL (ref 0.0–0.1)
Basophils Relative: 1 %
Eosinophils Absolute: 0 10*3/uL (ref 0.0–0.5)
Eosinophils Relative: 0 %
HCT: 29.3 % — ABNORMAL LOW (ref 39.0–52.0)
Hemoglobin: 9.4 g/dL — ABNORMAL LOW (ref 13.0–17.0)
Immature Granulocytes: 1 %
Lymphocytes Relative: 13 %
Lymphs Abs: 0.7 10*3/uL (ref 0.7–4.0)
MCH: 30.1 pg (ref 26.0–34.0)
MCHC: 32.1 g/dL (ref 30.0–36.0)
MCV: 93.9 fL (ref 80.0–100.0)
Monocytes Absolute: 0.6 10*3/uL (ref 0.1–1.0)
Monocytes Relative: 11 %
Neutro Abs: 4.1 10*3/uL (ref 1.7–7.7)
Neutrophils Relative %: 74 %
Platelet Count: 101 10*3/uL — ABNORMAL LOW (ref 150–400)
RBC: 3.12 MIL/uL — ABNORMAL LOW (ref 4.22–5.81)
RDW: 18.1 % — ABNORMAL HIGH (ref 11.5–15.5)
WBC Count: 5.5 10*3/uL (ref 4.0–10.5)
nRBC: 0 % (ref 0.0–0.2)

## 2020-10-28 LAB — CMP (CANCER CENTER ONLY)
ALT: 10 U/L (ref 0–44)
AST: 19 U/L (ref 15–41)
Albumin: 2.9 g/dL — ABNORMAL LOW (ref 3.5–5.0)
Alkaline Phosphatase: 73 U/L (ref 38–126)
Anion gap: 8 (ref 5–15)
BUN: 26 mg/dL — ABNORMAL HIGH (ref 8–23)
CO2: 26 mmol/L (ref 22–32)
Calcium: 8.1 mg/dL — ABNORMAL LOW (ref 8.9–10.3)
Chloride: 107 mmol/L (ref 98–111)
Creatinine: 1.51 mg/dL — ABNORMAL HIGH (ref 0.61–1.24)
GFR, Estimated: 47 mL/min — ABNORMAL LOW (ref >60)
Glucose, Bld: 176 mg/dL — ABNORMAL HIGH (ref 70–99)
Potassium: 4.7 mmol/L (ref 3.5–5.1)
Sodium: 141 mmol/L (ref 135–145)
Total Bilirubin: 0.8 mg/dL (ref 0.3–1.2)
Total Protein: 5.8 g/dL — ABNORMAL LOW (ref 6.5–8.1)

## 2020-10-29 ENCOUNTER — Telehealth: Payer: Self-pay | Admitting: Internal Medicine

## 2020-10-29 NOTE — Telephone Encounter (Signed)
Patient portion of eliquis assistance application mailed - to be completed/returned

## 2020-11-04 ENCOUNTER — Inpatient Hospital Stay: Payer: Medicare Other

## 2020-11-04 ENCOUNTER — Other Ambulatory Visit: Payer: Self-pay

## 2020-11-04 DIAGNOSIS — C3412 Malignant neoplasm of upper lobe, left bronchus or lung: Secondary | ICD-10-CM | POA: Diagnosis not present

## 2020-11-04 DIAGNOSIS — C3491 Malignant neoplasm of unspecified part of right bronchus or lung: Secondary | ICD-10-CM

## 2020-11-04 LAB — CMP (CANCER CENTER ONLY)
ALT: 9 U/L (ref 0–44)
AST: 15 U/L (ref 15–41)
Albumin: 3.1 g/dL — ABNORMAL LOW (ref 3.5–5.0)
Alkaline Phosphatase: 78 U/L (ref 38–126)
Anion gap: 9 (ref 5–15)
BUN: 21 mg/dL (ref 8–23)
CO2: 25 mmol/L (ref 22–32)
Calcium: 8.8 mg/dL — ABNORMAL LOW (ref 8.9–10.3)
Chloride: 108 mmol/L (ref 98–111)
Creatinine: 1.68 mg/dL — ABNORMAL HIGH (ref 0.61–1.24)
GFR, Estimated: 41 mL/min — ABNORMAL LOW (ref >60)
Glucose, Bld: 128 mg/dL — ABNORMAL HIGH (ref 70–99)
Potassium: 4.4 mmol/L (ref 3.5–5.1)
Sodium: 142 mmol/L (ref 135–145)
Total Bilirubin: 0.5 mg/dL (ref 0.3–1.2)
Total Protein: 6.2 g/dL — ABNORMAL LOW (ref 6.5–8.1)

## 2020-11-04 LAB — CBC WITH DIFFERENTIAL (CANCER CENTER ONLY)
Abs Immature Granulocytes: 0.01 10*3/uL (ref 0.00–0.07)
Basophils Absolute: 0.1 10*3/uL (ref 0.0–0.1)
Basophils Relative: 1 %
Eosinophils Absolute: 0 10*3/uL (ref 0.0–0.5)
Eosinophils Relative: 1 %
HCT: 26.7 % — ABNORMAL LOW (ref 39.0–52.0)
Hemoglobin: 8.5 g/dL — ABNORMAL LOW (ref 13.0–17.0)
Immature Granulocytes: 0 %
Lymphocytes Relative: 18 %
Lymphs Abs: 0.8 10*3/uL (ref 0.7–4.0)
MCH: 30.7 pg (ref 26.0–34.0)
MCHC: 31.8 g/dL (ref 30.0–36.0)
MCV: 96.4 fL (ref 80.0–100.0)
Monocytes Absolute: 0.7 10*3/uL (ref 0.1–1.0)
Monocytes Relative: 17 %
Neutro Abs: 2.7 10*3/uL (ref 1.7–7.7)
Neutrophils Relative %: 63 %
Platelet Count: 43 10*3/uL — ABNORMAL LOW (ref 150–400)
RBC: 2.77 MIL/uL — ABNORMAL LOW (ref 4.22–5.81)
RDW: 19.6 % — ABNORMAL HIGH (ref 11.5–15.5)
WBC Count: 4.3 10*3/uL (ref 4.0–10.5)
nRBC: 0 % (ref 0.0–0.2)

## 2020-11-04 NOTE — Progress Notes (Signed)
I have reviewed and agreed above plan. 

## 2020-11-05 ENCOUNTER — Other Ambulatory Visit: Payer: Self-pay

## 2020-11-05 ENCOUNTER — Ambulatory Visit (HOSPITAL_COMMUNITY)
Admission: RE | Admit: 2020-11-05 | Discharge: 2020-11-05 | Disposition: A | Discharge status: Home / Self Care | Payer: Medicare Other | Source: Ambulatory Visit | Attending: Medical | Admitting: Medical

## 2020-11-05 ENCOUNTER — Inpatient Hospital Stay: Payer: Medicare Other

## 2020-11-05 ENCOUNTER — Telehealth: Payer: Self-pay | Admitting: Medical Oncology

## 2020-11-05 ENCOUNTER — Inpatient Hospital Stay (HOSPITAL_BASED_OUTPATIENT_CLINIC_OR_DEPARTMENT_OTHER): Payer: Medicare Other | Admitting: Medical

## 2020-11-05 VITALS — BP 135/61 | HR 57 | Temp 97.7°F | Resp 19 | Ht 67.0 in | Wt 244.0 lb

## 2020-11-05 DIAGNOSIS — J90 Pleural effusion, not elsewhere classified: Secondary | ICD-10-CM | POA: Diagnosis not present

## 2020-11-05 DIAGNOSIS — Z95828 Presence of other vascular implants and grafts: Secondary | ICD-10-CM

## 2020-11-05 DIAGNOSIS — C3490 Malignant neoplasm of unspecified part of unspecified bronchus or lung: Secondary | ICD-10-CM

## 2020-11-05 DIAGNOSIS — C3491 Malignant neoplasm of unspecified part of right bronchus or lung: Secondary | ICD-10-CM | POA: Diagnosis not present

## 2020-11-05 DIAGNOSIS — D6481 Anemia due to antineoplastic chemotherapy: Secondary | ICD-10-CM | POA: Diagnosis not present

## 2020-11-05 DIAGNOSIS — R0602 Shortness of breath: Secondary | ICD-10-CM | POA: Diagnosis not present

## 2020-11-05 DIAGNOSIS — D649 Anemia, unspecified: Secondary | ICD-10-CM

## 2020-11-05 DIAGNOSIS — R0682 Tachypnea, not elsewhere classified: Secondary | ICD-10-CM

## 2020-11-05 DIAGNOSIS — R0902 Hypoxemia: Secondary | ICD-10-CM | POA: Diagnosis not present

## 2020-11-05 DIAGNOSIS — R001 Bradycardia, unspecified: Secondary | ICD-10-CM

## 2020-11-05 DIAGNOSIS — C781 Secondary malignant neoplasm of mediastinum: Secondary | ICD-10-CM | POA: Diagnosis not present

## 2020-11-05 DIAGNOSIS — Z5112 Encounter for antineoplastic immunotherapy: Secondary | ICD-10-CM | POA: Diagnosis not present

## 2020-11-05 DIAGNOSIS — C3412 Malignant neoplasm of upper lobe, left bronchus or lung: Secondary | ICD-10-CM | POA: Diagnosis not present

## 2020-11-05 DIAGNOSIS — C3431 Malignant neoplasm of lower lobe, right bronchus or lung: Secondary | ICD-10-CM | POA: Diagnosis not present

## 2020-11-05 DIAGNOSIS — Z5111 Encounter for antineoplastic chemotherapy: Secondary | ICD-10-CM | POA: Diagnosis not present

## 2020-11-05 DIAGNOSIS — Z79899 Other long term (current) drug therapy: Secondary | ICD-10-CM | POA: Diagnosis not present

## 2020-11-05 LAB — CBC WITH DIFFERENTIAL (CANCER CENTER ONLY)
Abs Immature Granulocytes: 0.02 10*3/uL (ref 0.00–0.07)
Basophils Absolute: 0.1 10*3/uL (ref 0.0–0.1)
Basophils Relative: 1 %
Eosinophils Absolute: 0 10*3/uL (ref 0.0–0.5)
Eosinophils Relative: 0 %
HCT: 26.1 % — ABNORMAL LOW (ref 39.0–52.0)
Hemoglobin: 8.5 g/dL — ABNORMAL LOW (ref 13.0–17.0)
Immature Granulocytes: 1 %
Lymphocytes Relative: 22 %
Lymphs Abs: 0.9 10*3/uL (ref 0.7–4.0)
MCH: 30.9 pg (ref 26.0–34.0)
MCHC: 32.6 g/dL (ref 30.0–36.0)
MCV: 94.9 fL (ref 80.0–100.0)
Monocytes Absolute: 0.6 10*3/uL (ref 0.1–1.0)
Monocytes Relative: 14 %
Neutro Abs: 2.6 10*3/uL (ref 1.7–7.7)
Neutrophils Relative %: 62 %
Platelet Count: 41 10*3/uL — ABNORMAL LOW (ref 150–400)
RBC: 2.75 MIL/uL — ABNORMAL LOW (ref 4.22–5.81)
RDW: 20.4 % — ABNORMAL HIGH (ref 11.5–15.5)
WBC Count: 4.2 10*3/uL (ref 4.0–10.5)
nRBC: 0 % (ref 0.0–0.2)

## 2020-11-05 LAB — SAMPLE TO BLOOD BANK

## 2020-11-05 MED ORDER — SODIUM CHLORIDE 0.9% FLUSH
10.0000 mL | INTRAVENOUS | Status: DC | PRN
  Administered 2020-11-05: 10 mL via INTRAVENOUS
  Filled 2020-11-05: qty 10

## 2020-11-05 NOTE — Telephone Encounter (Signed)
PT reports "oxygen sat 85-92 range. Lungs clear. Resp 20-22. He does not feel well today and is pale". I spoke to wife and she reiterated these symptoms.  Per Dr Julien Nordmann -pt needs to come to [facility]. Labs done yesterday . Will add cbc and sample to blood bank. Schedule message sent.

## 2020-11-05 NOTE — Progress Notes (Signed)
Patient seen and assessed by Sandi Mealy, PA.

## 2020-11-08 ENCOUNTER — Ambulatory Visit (HOSPITAL_COMMUNITY)
Admission: RE | Admit: 2020-11-08 | Discharge: 2020-11-08 | Disposition: A | Discharge status: Home / Self Care | Payer: Medicare Other | Source: Ambulatory Visit | Attending: Internal Medicine | Admitting: Internal Medicine

## 2020-11-08 ENCOUNTER — Other Ambulatory Visit: Payer: Self-pay

## 2020-11-08 ENCOUNTER — Encounter (HOSPITAL_COMMUNITY): Payer: Self-pay

## 2020-11-08 DIAGNOSIS — E1342 Other specified diabetes mellitus with diabetic polyneuropathy: Secondary | ICD-10-CM | POA: Diagnosis not present

## 2020-11-08 DIAGNOSIS — I358 Other nonrheumatic aortic valve disorders: Secondary | ICD-10-CM | POA: Diagnosis not present

## 2020-11-08 DIAGNOSIS — M109 Gout, unspecified: Secondary | ICD-10-CM | POA: Diagnosis not present

## 2020-11-08 DIAGNOSIS — D62 Acute posthemorrhagic anemia: Secondary | ICD-10-CM | POA: Diagnosis not present

## 2020-11-08 DIAGNOSIS — E039 Hypothyroidism, unspecified: Secondary | ICD-10-CM | POA: Diagnosis not present

## 2020-11-08 DIAGNOSIS — I480 Paroxysmal atrial fibrillation: Secondary | ICD-10-CM | POA: Diagnosis not present

## 2020-11-08 DIAGNOSIS — N1831 Chronic kidney disease, stage 3a: Secondary | ICD-10-CM | POA: Diagnosis not present

## 2020-11-08 DIAGNOSIS — N400 Enlarged prostate without lower urinary tract symptoms: Secondary | ICD-10-CM | POA: Diagnosis not present

## 2020-11-08 DIAGNOSIS — I5032 Chronic diastolic (congestive) heart failure: Secondary | ICD-10-CM | POA: Diagnosis not present

## 2020-11-08 DIAGNOSIS — E785 Hyperlipidemia, unspecified: Secondary | ICD-10-CM | POA: Diagnosis not present

## 2020-11-08 DIAGNOSIS — E1151 Type 2 diabetes mellitus with diabetic peripheral angiopathy without gangrene: Secondary | ICD-10-CM | POA: Diagnosis not present

## 2020-11-08 DIAGNOSIS — N179 Acute kidney failure, unspecified: Secondary | ICD-10-CM | POA: Diagnosis not present

## 2020-11-08 DIAGNOSIS — I130 Hypertensive heart and chronic kidney disease with heart failure and stage 1 through stage 4 chronic kidney disease, or unspecified chronic kidney disease: Secondary | ICD-10-CM | POA: Diagnosis not present

## 2020-11-08 DIAGNOSIS — K3189 Other diseases of stomach and duodenum: Secondary | ICD-10-CM | POA: Diagnosis not present

## 2020-11-08 DIAGNOSIS — C3490 Malignant neoplasm of unspecified part of unspecified bronchus or lung: Secondary | ICD-10-CM | POA: Insufficient documentation

## 2020-11-08 DIAGNOSIS — C3491 Malignant neoplasm of unspecified part of right bronchus or lung: Secondary | ICD-10-CM | POA: Diagnosis not present

## 2020-11-08 DIAGNOSIS — G4733 Obstructive sleep apnea (adult) (pediatric): Secondary | ICD-10-CM | POA: Diagnosis not present

## 2020-11-08 DIAGNOSIS — N281 Cyst of kidney, acquired: Secondary | ICD-10-CM | POA: Diagnosis not present

## 2020-11-08 DIAGNOSIS — E1322 Other specified diabetes mellitus with diabetic chronic kidney disease: Secondary | ICD-10-CM | POA: Diagnosis not present

## 2020-11-08 DIAGNOSIS — I2510 Atherosclerotic heart disease of native coronary artery without angina pectoris: Secondary | ICD-10-CM | POA: Diagnosis not present

## 2020-11-08 DIAGNOSIS — K2091 Esophagitis, unspecified with bleeding: Secondary | ICD-10-CM | POA: Diagnosis not present

## 2020-11-08 MED ORDER — IOHEXOL 300 MG/ML  SOLN
80.0000 mL | Freq: Once | INTRAMUSCULAR | Status: AC | PRN
  Administered 2020-11-08: 80 mL via INTRAVENOUS

## 2020-11-08 MED ORDER — HEPARIN SOD (PORK) LOCK FLUSH 100 UNIT/ML IV SOLN
INTRAVENOUS | Status: AC
  Filled 2020-11-08: qty 5

## 2020-11-08 MED ORDER — HEPARIN SOD (PORK) LOCK FLUSH 100 UNIT/ML IV SOLN
500.0000 [IU] | Freq: Once | INTRAVENOUS | Status: AC
  Administered 2020-11-08: 500 [IU] via INTRAVENOUS

## 2020-11-09 ENCOUNTER — Other Ambulatory Visit: Payer: Self-pay | Admitting: Physician Assistant

## 2020-11-09 DIAGNOSIS — D6481 Anemia due to antineoplastic chemotherapy: Secondary | ICD-10-CM

## 2020-11-09 DIAGNOSIS — T451X5A Adverse effect of antineoplastic and immunosuppressive drugs, initial encounter: Secondary | ICD-10-CM

## 2020-11-09 NOTE — Progress Notes (Unsigned)
[facility] OFFICE PROGRESS NOTE  [name], MD [address] [place]  [id number]  DIAGNOSIS: Stage IV (T4, N2, M1 a) non-small cell lung cancer, mucinous adenocarcinoma presented with bilateral large masses in the left upper lobe as well as the right lower lobe in addition to other small nodules in the right upper lobe with hilar and mediastinal lymphadenopathy diagnosed in August 2021.  Biomarker Findings Microsatellite status - MS-Stable Tumor Mutational Burden - 4 Muts/Mb Genomic Findings For a complete list of the genes assayed, please refer to the Appendix. KRAS G12C GNAS R201H 7 Disease relevant genes with no reportable alterations: ALK, BRAF, EGFR, ERBB2, MET, RET, ROS1  PDL1 Expression: 20%.  PRIOR THERAPY: None  CURRENT THERAPY: Systemic chemotherapy with carboplatin for AUC of 5, Alimta 500 mg/M2 and Keytruda 200 mg IV every 3 weeks.  First dose 09/09/2020.  Status post 2 cycles. Dose reduced to carboplatin for an AUC of 4 from cycle #4 and Alimta held with cycle #4.   INTERVAL HISTORY: Jurell S Martinique 79 y.o. male returns to the clinic today for follow-up visit. The patient is for 9_today without any concerning complaints except for _. In the interval since his last appointment, the patient was seen in the symptom management clinic for the chief complaint of generalized feeling unwell and pale mass and shortness of breath. The patient was set up with home oxygen via nasal cannula. For the patient's last cycle of treatment, Alimta was held due to elevated creatinine. He also had carboplatin reduced to an AUC of 4 which she tolerated _. Otherwise the patient denies any recent fever, chills, night sweats, weight loss? He is followed by member the nutritionist team. The patient denies any chest pain, cough, or hemoptysis but reports shortness of breath? He denies any nausea or vomiting. He denies any diarrhea or constipation. She denies any  headache or visual changes. Denies any rashes or skin changes. The patient recently had a restaging CT scan performed. Patient is here today for evaluation and to review his scan results before starting cycle #4.     MEDICAL HISTORY: Past Medical History:  Diagnosis Date  . Aortic stenosis, mild 11/14/2013  . Aortic valve sclerosis 03/29/2015  . Bilateral leg edema 05/21/2014  . CAD (coronary artery disease)   . Chronic diastolic congestive heart failure ([place])   . Chronic kidney disease (CKD), stage III (moderate) (HCC)   . Diabetes 1.5, managed as type 1 ([place]) 02/04/2013  . Diabetic peripheral neuropathy associated with type 1 diabetes mellitus ([place]) 11/14/2013  . Dysphagia   . Dyspnea on exertion 03/21/2015  . Exogenous obesity   . Gout   . Heart attack ([place])   . History of nuclear stress test 07/2011   dipyridamole; fixed inferolateral defect, worse at stress than rest; no reversible ischemia; low risk scan   . Hyperlipidemia   . Hypertension   . Hypothyroidism   . Insulin dependent diabetes mellitus   . Left foot drop   . Left main coronary artery disease 03/28/2015  . Memory loss   . Obesity (BMI 30-39.9) 11/14/2013  . Obstructive sleep apnea 03/21/2015  . OSA on CPAP    uses a cpap  . Peptic ulcer with hemorrhage 03/28/2015  . Peripheral neuropathy   . Pneumonia   . Rhabdomyolysis   . 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 Stroke ([place])    L patietal  with small scattered lacunar infarcts  . Thrombocytopenia ([place]) 03/21/2015  . Venous insufficiency   . Weakness generalized 03/21/2015    ALLERGIES:  is allergic to actos [pioglitazone], metformin and related, and niaspan [niacin er].  MEDICATIONS:  Current Outpatient Medications  Medication Sig Dispense Refill  . albuterol (VENTOLIN HFA) 108 (90 Base) MCG/ACT inhaler Inhale 2 puffs into the lungs every 6 (six) hours as needed for wheezing or shortness of breath. 8 g 6  . allopurinol  (ZYLOPRIM) 100 MG tablet Take 1 tablet (100 mg total) by mouth daily. 30 tablet 6  . amitriptyline (ELAVIL) 50 MG tablet Take 75 mg by mouth at bedtime.     Marland Kitchen apixaban (ELIQUIS) 2.5 MG TABS tablet Take 1 tablet (2.5 mg total) by mouth 2 (two) times daily. 180 tablet 3  . Blood Glucose Monitoring Suppl (CONTOUR NEXT MONITOR) w/Device KIT 1 Device by Does not apply route 3 (three) times daily. Use to check blood sugars 3 times daily. Dx Code E13.9 1 kit 2  . CONTOUR NEXT TEST test strip USE 1 STRIP TO CHECK GLUCOSE 4 TIMES DAILY 300 each 1  . donepezil (ARICEPT) 10 MG tablet TAKE ONE TABLET DAILY.  PLEASE CALL [phone] TO SCHEDULE FOLLOW UP. (Patient taking differently: Take 10 mg by mouth daily. ) 90 tablet 0  . feeding supplement, GLUCERNA SHAKE, (GLUCERNA SHAKE) LIQD Take 237 mLs by mouth 3 (three) times daily between meals. 3235 mL 0  . folic acid (FOLVITE) 1 MG tablet Take 1 tablet (1 mg total) by mouth daily. 30 tablet 4  . furosemide (LASIX) 40 MG tablet Take 1 tablet (40 mg total) by mouth in the morning. 30 tablet 6  . insulin degludec (TRESIBA FLEXTOUCH) 200 UNIT/ML FlexTouch Pen Inject 24 Units into the skin at bedtime. (Patient taking differently: Inject 20 Units into the skin at bedtime. 24 units on Chemo days)    . insulin lispro (HUMALOG) 100 UNIT/ML cartridge Inject 5-15 Units into the skin 3 (three) times daily with meals. Sliding scale.    . Insulin Pen Needle (EASY COMFORT PEN NEEDLES) 33G X 4 MM MISC 1 each by Does not apply route See admin instructions. Use to inject insulin 5 times daily. 150 each 3  . levothyroxine (SYNTHROID, LEVOTHROID) 75 MCG tablet Take 75 mcg by mouth daily before breakfast.   1  . lidocaine (XYLOCAINE) 2 % solution Use as directed 15 mLs in the mouth or throat as needed for mouth pain. 100 mL 0  . lidocaine-prilocaine (EMLA) cream Apply 1 application topically as needed. Apply a teaspoon over port site at least 1 hour prior to lab appt. Do not rub in and  cover with plastic wrap. 30 g 0  . liraglutide (VICTOZA) 18 MG/3ML SOPN Inject 18 mg into the skin daily. Inject 1.29m into skin daily    . lisinopril (ZESTRIL) 10 MG tablet TAKE ONE TABLET DAILY (Patient taking differently: Take 10 mg by mouth daily. ) 90 tablet 2  . metFORMIN (GLUCOPHAGE-XR) 750 MG 24 hr tablet TAKE 1 TABLET WITH DINNER (Patient taking differently: Take 750 mg by mouth daily. ) 90 tablet 0  . metoprolol succinate (TOPROL-XL) 25 MG 24 hr tablet Take 1 tablet (25 mg total) by mouth daily. 90 tablet 3  . NON FORMULARY Inhale 1 application into the lungs at bedtime. CPAP    . ondansetron (ZOFRAN) 4 MG tablet Take 1 tablet (4 mg total) by mouth every 6 (six) hours as needed for nausea. 20 tablet  0  . pantoprazole (PROTONIX) 40 MG tablet Take 1 tablet (40 mg total) by mouth 2 (two) times daily before a meal. 60 tablet 0  . potassium chloride SA (K-DUR,KLOR-CON) 20 MEQ tablet Take 20 mEq by mouth 2 (two) times daily.     . prochlorperazine (COMPAZINE) 10 MG tablet Take 1 tablet (10 mg total) by mouth every 6 (six) hours as needed for nausea or vomiting. 30 tablet 0  . rosuvastatin (CRESTOR) 20 MG tablet Take 20 mg by mouth daily.    . Tamsulosin HCl (FLOMAX) 0.4 MG CAPS Take 0.4 mg by mouth daily.     . vitamin B-12 (CYANOCOBALAMIN) 1000 MCG tablet Take 1,000 mcg by mouth daily.     No current facility-administered medications for this visit.    SURGICAL HISTORY:  Past Surgical History:  Procedure Laterality Date  . BACK SURGERY  2002   lumbosacral. 11 back surgeries total  . BRONCHIAL BIOPSY  08/06/2020   Procedure: BRONCHIAL BIOPSIES;  Surgeon: Collene Gobble, MD;  Location[facility] ENDOSCOPY;  Service: Pulmonary;;  . BRONCHIAL BRUSHINGS  08/06/2020   Procedure: BRONCHIAL BRUSHINGS;  Surgeon: Collene Gobble, MD;  Location: [facility] ENDOSCOPY;  Service: Pulmonary;;  . BRONCHIAL NEEDLE ASPIRATION BIOPSY  08/06/2020   Procedure: BRONCHIAL NEEDLE ASPIRATION BIOPSIES;  Surgeon: Collene Gobble, MD;  Location: [facility] ENDOSCOPY;  Service: Pulmonary;;  . Carotid Doppler  03/2013   bilat bulb/prox ICAs - mild amount of fibrous plaque with no evidence of diameter reduction  . CARPAL TUNNEL RELEASE Bilateral 08/09/2014   Procedure: BILATERAL CARPAL TUNNEL RELEASE;  Surgeon: Daryll Brod, MD;  Location: [facility];  Service: Orthopedics;  Laterality: Bilateral;  ANESTHESIA:  IV REGIONAL BIL FAB  . CHOLECYSTECTOMY    . COLONOSCOPY    . CORONARY ANGIOPLASTY  10/13/1996  . CORONARY ANGIOPLASTY  09/21/1989   emergency PTCA  . CORONARY ANGIOPLASTY  10/13/1996   Multi-Link diagonal & OD stenting (Dr. Marella Chimes)  . CORONARY ANGIOPLASTY  12/03/1997   disease of mid DX-1 ~50% & in mid PLA & PDA (distal lesions) (Dr. Marella Chimes)   . CORONARY ANGIOPLASTY  10/14/1999   progression of disease distal PLA & PDA; progression of disease prox RCA - moderate (Dr. Marella Chimes)   . CORONARY ANGIOPLASTY WITH STENT PLACEMENT  04/04/2004   4.0x76m non-DES (thrombectomy via AngioJet) to RCA for high grade stenosis (Dr. RMarella Chimes  . CORONARY ARTERY BYPASS GRAFT N/A 03/29/2015   Procedure: CORONARY ARTERY BYPASS GRAFTING TIMES TWO USING LEFT INTERNAL MAMMARY ARTERY AND RIGHT LEG GREATER SAPHENOUS VEIN HARVESTED ENDOSCOPICALLY.;  Surgeon: CRexene Alberts MD;  [facility] [facility]  Service: Open Heart Surgery;  Laterality: N/A;  . ESOPHAGOGASTRODUODENOSCOPY N/A 03/27/2015   Procedure: ESOPHAGOGASTRODUODENOSCOPY (EGD);  Surgeon: MClarene Essex MD;  [facility];  Service: Endoscopy;  Laterality: N/A;  possible dilation  . ESOPHAGOGASTRODUODENOSCOPY N/A 10/05/2020   Procedure: ESOPHAGOGASTRODUODENOSCOPY (EGD);  Surgeon: OArta Silence MD;  [facility];  Service: Endoscopy;  Laterality: N/A;  . FINE NEEDLE ASPIRATION  08/06/2020   Procedure: FINE NEEDLE ASPIRATION (FNA) LINEAR;  Surgeon: BCollene Gobble MD;  [facility];  Service: Pulmonary;;  . IR IMAGING GUIDED PORT  INSERTION  10/07/2020  . LEFT HEART CATHETERIZATION WITH CORONARY ANGIOGRAM N/A 03/28/2015   Procedure: LEFT HEART CATHETERIZATION WITH CORONARY ANGIOGRAM;  Surgeon: Peter M JMartinique MD;  [facility];  Service: Cardiovascular;  Laterality: N/A;  . SINUS ENDO W/FUSION    . TONSILLECTOMY    .  TRANSTHORACIC ECHOCARDIOGRAM  08/08/2013   EF 55-60%, mild conc hypertrophy, grade 1 diastolic dysfunction; AV with mild stenosis; LA & RA mildly dilated  . VIDEO BRONCHOSCOPY WITH ENDOBRONCHIAL NAVIGATION N/A 08/06/2020   Procedure: VIDEO BRONCHOSCOPY WITH ENDOBRONCHIAL NAVIGATION;  Surgeon: Collene Gobble, MD;  Location: [facility] ENDOSCOPY;  Service: Pulmonary;  Laterality: N/A;  . VIDEO BRONCHOSCOPY WITH ENDOBRONCHIAL ULTRASOUND N/A 08/06/2020   Procedure: VIDEO BRONCHOSCOPY WITH ENDOBRONCHIAL ULTRASOUND;  Surgeon: Collene Gobble, MD;  Location: [facility] ENDOSCOPY;  Service: Pulmonary;  Laterality: N/A;    REVIEW OF SYSTEMS:   Review of Systems  Constitutional: Negative for appetite change, chills, fatigue, fever and unexpected weight change.  HENT:   Negative for mouth sores, nosebleeds, sore throat and trouble swallowing.   Eyes: Negative for eye problems and icterus.  Respiratory: Negative for cough, hemoptysis, shortness of breath and wheezing.   Cardiovascular: Negative for chest pain and leg swelling.  Gastrointestinal: Negative for abdominal pain, constipation, diarrhea, nausea and vomiting.  Genitourinary: Negative for bladder incontinence, difficulty urinating, dysuria, frequency and hematuria.   Musculoskeletal: Negative for back pain, gait problem, neck pain and neck stiffness.  Skin: Negative for itching and rash.  Neurological: Negative for dizziness, extremity weakness, gait problem, headaches, light-headedness and seizures.  Hematological: Negative for adenopathy. Does not bruise/bleed easily.  Psychiatric/Behavioral: Negative for confusion, depression and sleep disturbance. The patient is  not nervous/anxious.     PHYSICAL EXAMINATION:  There were no vitals taken for this visit.  ECOG PERFORMANCE STATUS: {CHL ONC ECOG [id number]  Physical Exam  Constitutional: Oriented to person, place, and time and well-developed, well-nourished, and in no distress. No distress.  HENT:  Head: Normocephalic and atraumatic.  Mouth/Throat: Oropharynx is clear and moist. No oropharyngeal exudate.  Eyes: Conjunctivae are normal. Right eye exhibits no discharge. Left eye exhibits no discharge. No scleral icterus.  Neck: Normal range of motion. Neck supple.  Cardiovascular: Normal rate, regular rhythm, normal heart sounds and intact distal pulses.   Pulmonary/Chest: Effort normal and breath sounds normal. No respiratory distress. No wheezes. No rales.  Abdominal: Soft. Bowel sounds are normal. Exhibits no distension and no mass. There is no tenderness.  Musculoskeletal: Normal range of motion. Exhibits no edema.  Lymphadenopathy:    No cervical adenopathy.  Neurological: Alert and oriented to person, place, and time. Exhibits normal muscle tone. Gait normal. Coordination normal.  Skin: Skin is warm and dry. No rash noted. Not diaphoretic. No erythema. No pallor.  Psychiatric: Mood, memory and judgment normal.  Vitals reviewed.  LABORATORY DATA: Lab Results  Component Value Date   WBC 4.2 11/05/2020   HGB 8.5 (L) 11/05/2020   HCT 26.1 (L) 11/05/2020   MCV 94.9 11/05/2020   PLT 41 (L) 11/05/2020      Chemistry      Component Value Date/Time   NA 142 11/04/2020 0947   K 4.4 11/04/2020 0947   CL 108 11/04/2020 0947   CO2 25 11/04/2020 0947   BUN 21 11/04/2020 0947   CREATININE 1.68 (H) 11/04/2020 0947   CREATININE 1.30 (H) 04/30/2020 1044      Component Value Date/Time   CALCIUM 8.8 (L) 11/04/2020 0947   ALKPHOS 78 11/04/2020 0947   AST 15 11/04/2020 0947   ALT 9 11/04/2020 0947   BILITOT 0.5 11/04/2020 0947       RADIOGRAPHIC STUDIES:  DG Chest 2 View  Result  Date: 11/05/2020 CLINICAL DATA:  History of metastatic lung carcinoma with shortness of breath. EXAM: CHEST - 2  VIEW COMPARISON:  10/02/2020 FINDINGS: Right-sided chest wall port is noted new from the prior exam. Cardiac shadow remains enlarged. There are persistent mass lesions identified bilaterally but decreased in size when compared with the prior exam. Persistent right-sided pleural effusion is noted with lower lobe consolidation. No pneumothorax is noted. Postsurgical changes are seen. IMPRESSION: Reduction in size of bilateral chest masses. Persistent consolidation in the right base is noted with small associated effusion. Electronically Signed   By: Inez Catalina M.D.   On: 11/05/2020 14:53   CT Chest W Contrast  Result Date: 11/08/2020 CLINICAL DATA:  Non-small cell lung cancer diagnosed 2 months ago. Chemotherapy in progress. Restaging. EXAM: CT CHEST, ABDOMEN, AND PELVIS WITH CONTRAST TECHNIQUE: Multidetector CT imaging of the chest, abdomen and pelvis was performed following the standard protocol during bolus administration of intravenous contrast. CONTRAST:  23m OMNIPAQUE IOHEXOL 300 MG/ML SOLN. Contrast volume reduced due to renal insufficiency with estimated GFR of 41. COMPARISON:  Chest CT 06/21/2020, abdominal CT 09/19/2020 and PET-CT 08/22/2020. FINDINGS: CT CHEST FINDINGS Cardiovascular: Diffuse atherosclerosis of the aorta, great vessels and coronary arteries status post median sternotomy and CABG. There are dense calcifications of the aortic valve. Right IJ Port-A-Cath extends to superior cavoatrial junction. Stable mild cardiomegaly without significant pericardial fluid. Mediastinum/Nodes: Stable 11 mm pretracheal node on image 22/2, not hypermetabolic on prior PET-CT. Stable left infrahilar fullness. No progressive mediastinal, hilar or axillary lymphadenopathy. Stable 1.6 cm low-density right thyroid nodule on image 4/2, not hypermetabolic on PET-CT and previously evaluated by  ultrasound in 2013. The trachea and esophagus demonstrate no significant findings. Lungs/Pleura: Small dependent right-greater-than-left pleural effusions have mildly enlarged. There is no pneumothorax. The large irregular left upper lobe mass which was hypermetabolic on PET-CT appears slightly smaller, now measuring approximately 7.4 x 4.0 cm on image 64/6 (previously up to 8.1 cm). Cavitary mass posteriorly in the right upper lobe also appears slightly smaller, measuring approximately 5.1 x 3.5 cm on image 53/6 (previously 5.7 x 5.7 cm). There is progressive volume loss, parenchymal opacity and bronchiectasis in the right lower lobe. Underlying mild centrilobular emphysema noted. No new nodules identified within the aerated portions of the lungs. Musculoskeletal/Chest wall: No chest wall mass or suspicious osseous findings. CT ABDOMEN AND PELVIS FINDINGS Hepatobiliary: The liver is normal in density without suspicious focal abnormality. Mild periportal edema. No significant biliary dilatation post cholecystectomy. Pancreas: Unremarkable. No pancreatic ductal dilatation or surrounding inflammatory changes. Spleen: Normal in size without focal abnormality. Adrenals/Urinary Tract: Both adrenal glands appear normal. There is suspicion of an enhancing mass in the interpolar region of the left kidney, most obvious on the coronal images. This measures approximately 2.0 cm on image 74/2 and is relatively hypodense on the delayed post-contrast images. Stable 4.8 cm cyst in the lower pole of the right kidney. There are additional subcentimeter low-density renal lesions bilaterally which are likely all cysts. No evidence of urinary tract calculus or hydronephrosis. The bladder appears unremarkable. Stomach/Bowel: The stomach is incompletely distended, likely accounting for diffuse wall thickening. The previously demonstrated wall thickening and inflammation surrounding the proximal duodenum have resolved. No other  significant bowel wall thickening, distention or surrounding inflammation. The appendix appears normal. A moderate amount of stool is present throughout the colon. Vascular/Lymphatic: There are no enlarged abdominal or pelvic lymph nodes. Aortic and branch vessel atherosclerosis without acute vascular findings. The portal, superior mesenteric and splenic veins are patent. Reproductive: Stable mild enlargement of the prostate gland. Other: No ascites or peritoneal nodularity. Musculoskeletal:  At large partially calcified soft tissue mass within the subcutaneous fat and gluteus maximus muscle posterior to the left sacrum and iliac bone is unchanged. This was not significantly hypermetabolic on PET-CT. There are smaller contralateral calcifications on the right. Multilevel spondylosis noted. IMPRESSION: 1. Interval slight decrease in size of bilateral upper lobe lung masses consistent with response to therapy. These masses are not well defined, in part due to increasing atelectasis and bilateral pleural effusions. 2. No evidence of progressive thoracic disease. No new pulmonary nodules identified. 3. No evidence of metastatic disease within the abdomen or pelvis. 4. Probable enhancing mass in the interpolar region of the left kidney, suspicious for primary renal cell carcinoma. 5. Stable partially calcified soft tissue mass within the left buttocks compared with recent prior imaging. This was partially imaged on abdominal CT performed 05/12/2007 and appeared fatty. Etiology for this is uncertain, and potentially this represents chronic fat necrosis although a liposarcoma cannot be excluded. Metastatic lung cancer unlikely. 6. Aortic Atherosclerosis (ICD10-I70.0). Electronically Signed   By: Richardean Sale M.D.   On: 11/08/2020 13:33   CT Abdomen Pelvis W Contrast  Result Date: 11/08/2020 CLINICAL DATA:  Non-small cell lung cancer diagnosed 2 months ago. Chemotherapy in progress. Restaging. EXAM: CT CHEST,  ABDOMEN, AND PELVIS WITH CONTRAST TECHNIQUE: Multidetector CT imaging of the chest, abdomen and pelvis was performed following the standard protocol during bolus administration of intravenous contrast. CONTRAST:  46m OMNIPAQUE IOHEXOL 300 MG/ML SOLN. Contrast volume reduced due to renal insufficiency with estimated GFR of 41. COMPARISON:  Chest CT 06/21/2020, abdominal CT 09/19/2020 and PET-CT 08/22/2020. FINDINGS: CT CHEST FINDINGS Cardiovascular: Diffuse atherosclerosis of the aorta, great vessels and coronary arteries status post median sternotomy and CABG. There are dense calcifications of the aortic valve. Right IJ Port-A-Cath extends to superior cavoatrial junction. Stable mild cardiomegaly without significant pericardial fluid. Mediastinum/Nodes: Stable 11 mm pretracheal node on image 22/2, not hypermetabolic on prior PET-CT. Stable left infrahilar fullness. No progressive mediastinal, hilar or axillary lymphadenopathy. Stable 1.6 cm low-density right thyroid nodule on image 4/2, not hypermetabolic on PET-CT and previously evaluated by ultrasound in 2013. The trachea and esophagus demonstrate no significant findings. Lungs/Pleura: Small dependent right-greater-than-left pleural effusions have mildly enlarged. There is no pneumothorax. The large irregular left upper lobe mass which was hypermetabolic on PET-CT appears slightly smaller, now measuring approximately 7.4 x 4.0 cm on image 64/6 (previously up to 8.1 cm). Cavitary mass posteriorly in the right upper lobe also appears slightly smaller, measuring approximately 5.1 x 3.5 cm on image 53/6 (previously 5.7 x 5.7 cm). There is progressive volume loss, parenchymal opacity and bronchiectasis in the right lower lobe. Underlying mild centrilobular emphysema noted. No new nodules identified within the aerated portions of the lungs. Musculoskeletal/Chest wall: No chest wall mass or suspicious osseous findings. CT ABDOMEN AND PELVIS FINDINGS Hepatobiliary: The  liver is normal in density without suspicious focal abnormality. Mild periportal edema. No significant biliary dilatation post cholecystectomy. Pancreas: Unremarkable. No pancreatic ductal dilatation or surrounding inflammatory changes. Spleen: Normal in size without focal abnormality. Adrenals/Urinary Tract: Both adrenal glands appear normal. There is suspicion of an enhancing mass in the interpolar region of the left kidney, most obvious on the coronal images. This measures approximately 2.0 cm on image 74/2 and is relatively hypodense on the delayed post-contrast images. Stable 4.8 cm cyst in the lower pole of the right kidney. There are additional subcentimeter low-density renal lesions bilaterally which are likely all cysts. No evidence of urinary tract calculus  or hydronephrosis. The bladder appears unremarkable. Stomach/Bowel: The stomach is incompletely distended, likely accounting for diffuse wall thickening. The previously demonstrated wall thickening and inflammation surrounding the proximal duodenum have resolved. No other significant bowel wall thickening, distention or surrounding inflammation. The appendix appears normal. A moderate amount of stool is present throughout the colon. Vascular/Lymphatic: There are no enlarged abdominal or pelvic lymph nodes. Aortic and branch vessel atherosclerosis without acute vascular findings. The portal, superior mesenteric and splenic veins are patent. Reproductive: Stable mild enlargement of the prostate gland. Other: No ascites or peritoneal nodularity. Musculoskeletal: At large partially calcified soft tissue mass within the subcutaneous fat and gluteus maximus muscle posterior to the left sacrum and iliac bone is unchanged. This was not significantly hypermetabolic on PET-CT. There are smaller contralateral calcifications on the right. Multilevel spondylosis noted. IMPRESSION: 1. Interval slight decrease in size of bilateral upper lobe lung masses consistent  with response to therapy. These masses are not well defined, in part due to increasing atelectasis and bilateral pleural effusions. 2. No evidence of progressive thoracic disease. No new pulmonary nodules identified. 3. No evidence of metastatic disease within the abdomen or pelvis. 4. Probable enhancing mass in the interpolar region of the left kidney, suspicious for primary renal cell carcinoma. 5. Stable partially calcified soft tissue mass within the left buttocks compared with recent prior imaging. This was partially imaged on abdominal CT performed 05/12/2007 and appeared fatty. Etiology for this is uncertain, and potentially this represents chronic fat necrosis although a liposarcoma cannot be excluded. Metastatic lung cancer unlikely. 6. Aortic Atherosclerosis (ICD10-I70.0). Electronically Signed   By: Richardean Sale M.D.   On: 11/08/2020 13:33     ASSESSMENT/PLAN:  This is a very pleasant 79 year old Caucasian male diagnosed with stage IV (T4, and 2, and 1A) non-small cell lung cancer, mucinous adenocarcinoma. The patient presented with bilateral large masses in the left upper lobe and the right upper lobe as well as other pulmonary nodules in the right upper lobe with hilar and mediastinal lymphadenopathy. The patient was diagnosed in August 2021. The patient's PD-L1 expression is 20%. 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  The patient is currently undergoing palliative systemic chemotherapy with carboplatin for an AUC of 5, Alimta 500 mg per metered squared, Keytruda 200 mg IV every 3 weeks. He is status post 3 cycles. For cycle #3, Alimta was held due to elevated creatinine. The patient's carboplatin was also reduced to an AUC of 4.  The patient recently had a restaging CT scan performed. Dr. Earlie Server personally and independently reviewed the scan and discussed the results  with the patient today. The scan showed a positive response to therapy with a decrease in size of the lung masses. Dr. Earlie Server recommends that the patient proceed with cycle 4 today as scheduled. Dose? Creatinine?  We will see the patient back for follow-up visit in 3 weeks for evaluation before starting cycle #5.  Primary kidney lesion?  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 I will add a sample to blood bank to weekly labs for the next 3 weeks we will arrange for a blood transfusion if his hemoglobin is less than 8.  The patient was advised to call immediately if she has any concerning symptoms in the interval. The patient voices understanding of current disease status and treatment options and is in agreement with the current  care plan. All questions were answered. The patient knows to call the clinic with any problems, questions or concerns. We can certainly see the patient much sooner if necessary       Orders Placed This Encounter  Procedures  . Sample to Blood Bank    Standing Status:   Standing    Number of Occurrences:   4    Standing Expiration Date:   11/09/2021     Yecenia Dalgleish L Glenys Snader, PA-C 11/09/20

## 2020-11-09 NOTE — Progress Notes (Signed)
[facility] OFFICE PROGRESS NOTE  Randall Arabian, MD Randall Wilkins [address]  [place] [address]   Stage IV (T4, N2, M1 a) non-small cell lung cancer, mucinous adenocarcinoma presented with bilateral large masses in the left upper lobe as well as the right lower lobe in addition to other small nodules in the right upper lobe with hilar and mediastinal lymphadenopathy diagnosed in August 2021.  Biomarker Findings Microsatellite status - MS-Stable Tumor Mutational Burden - 4 Muts/Mb Genomic Findings For a complete list of the genes assayed, please refer to the Appendix. KRAS G12C GNAS R201H 7 Disease relevant genes with no reportable alterations: ALK, BRAF, EGFR, ERBB2, MET, RET, ROS1  PDL1 Expression: 20%.  PRIOR THERAPY: None  CURRENT THERAPY: Systemic chemotherapy with carboplatin for AUC of 5, Alimta 500 mg/M2 and Keytruda 200 mg IV every 3 weeks. First dose 09/09/2020. Status post 2cycles. Dose reduced to carboplatin for an AUC of 4 from cycle #3 and Alimta held with cycle #4. Chemotherapy with alimta and carboplatin removed from treatment plan starting from cycle #4 and he will start single agent Keytruda  INTERVAL HISTORY: Randall Wilkins 79 y.o. male returns returns to the clinic today for a follow-up visit accompanied by his wife. The patient feeling fair today without any concerning complaints except for increased shortness of breath with exertion. In the interval since his last appointment, the patient was seen in the symptom management clinic for the chief complaint of generalized feeling unwell and pale mass and shortness of breath. The patient was set up with home oxygen via nasal cannula. He has not received his oxygen yet and are in the process of receiving this from the company. For the patient's last cycle of treatment, Alimta was held due to elevated creatinine. He also had carboplatin reduced to an AUC of 4 and [place]. Otherwise the patient  denies any recent fever, chills, night sweats, or weight loss. He is followed by member the nutritionist team. The patient denies any chest pain or hemoptysis. He had a "tickle" in his throat for which he used a cough drop which helped his symptoms. He denies any nausea or vomiting. He denies any diarrhea or constipation. He denies any headache or visual changes. Denies any rashes or skin changes. The patient recently had a restaging CT scan performed. Patient is here today for evaluation and to review his scan results before starting cycle #4.  MEDICAL HISTORY: Past Medical History:  Diagnosis Date  . Aortic stenosis, mild 11/14/2013  . Aortic valve sclerosis 03/29/2015  . Bilateral leg edema 05/21/2014  . CAD (coronary artery disease)   . Chronic diastolic congestive heart failure ([place])   . Chronic kidney disease (CKD), stage III (moderate) (HCC)   . Diabetes 1.5, managed as type 1 ([place]) 02/04/2013  . Diabetic peripheral neuropathy associated with type 1 diabetes mellitus ([place]) 11/14/2013  . Dysphagia   . Dyspnea on exertion 03/21/2015  . Exogenous obesity   . Gout   . Heart attack ([place])   . History of nuclear stress test 07/2011   dipyridamole; fixed inferolateral defect, worse at stress than rest; no reversible ischemia; low risk scan   . Hyperlipidemia   . Hypertension   . Hypothyroidism   . Insulin dependent diabetes mellitus   . Left foot drop   . Left main coronary artery disease 03/28/2015  . Memory loss   . Obesity (BMI 30-39.9) 11/14/2013  . Obstructive sleep apnea 03/21/2015  . OSA on CPAP  uses a cpap  . Peptic ulcer with hemorrhage 03/28/2015  . Peripheral neuropathy   . Pneumonia   . Rhabdomyolysis   . 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 Stroke ([place])    L patietal with small scattered lacunar infarcts  . Thrombocytopenia ([place]) 03/21/2015  . Venous insufficiency   . Weakness generalized 03/21/2015    ALLERGIES:  is allergic to actos  [pioglitazone], metformin and related, and niaspan [niacin er].  MEDICATIONS:  Current Outpatient Medications  Medication Sig Dispense Refill  . albuterol (VENTOLIN HFA) 108 (90 Base) MCG/ACT inhaler Inhale 2 puffs into the lungs every 6 (six) hours as needed for wheezing or shortness of breath. 8 g 6  . allopurinol (ZYLOPRIM) 100 MG tablet Take 1 tablet (100 mg total) by mouth daily. 30 tablet 6  . amitriptyline (ELAVIL) 50 MG tablet Take 75 mg by mouth at bedtime.     Marland Kitchen apixaban (ELIQUIS) 2.5 MG TABS tablet Take 1 tablet (2.5 mg total) by mouth 2 (two) times daily. 180 tablet 3  . Blood Glucose Monitoring Suppl (CONTOUR NEXT MONITOR) w/Device KIT 1 Device by Does not apply route 3 (three) times daily. Use to check blood sugars 3 times daily. Dx Code E13.9 1 kit 2  . CONTOUR NEXT TEST test strip USE 1 STRIP TO CHECK GLUCOSE 4 TIMES DAILY 300 each 1  . donepezil (ARICEPT) 10 MG tablet TAKE ONE TABLET DAILY.  PLEASE CALL [phone] TO SCHEDULE FOLLOW UP. (Patient taking differently: Take 10 mg by mouth daily. ) 90 tablet 0  . feeding supplement, GLUCERNA SHAKE, (GLUCERNA SHAKE) LIQD Take 237 mLs by mouth 3 (three) times daily between meals. 2633 mL 0  . folic acid (FOLVITE) 1 MG tablet Take 1 tablet (1 mg total) by mouth daily. 30 tablet 4  . furosemide (LASIX) 40 MG tablet Take 1 tablet (40 mg total) by mouth in the morning. 30 tablet 6  . insulin degludec (TRESIBA FLEXTOUCH) 200 UNIT/ML FlexTouch Pen Inject 24 Units into the skin at bedtime. (Patient taking differently: Inject 20 Units into the skin at bedtime. 24 units on Chemo days)    . insulin lispro (HUMALOG) 100 UNIT/ML cartridge Inject 5-15 Units into the skin 3 (three) times daily with meals. Sliding scale.    . Insulin Pen Needle (EASY COMFORT PEN NEEDLES) 33G X 4 MM MISC 1 each by Does not apply route See admin instructions. Use to inject insulin 5 times daily. 150 each 3  . levothyroxine (SYNTHROID, LEVOTHROID) 75 MCG tablet Take 75  mcg by mouth daily before breakfast.   1  . lidocaine (XYLOCAINE) 2 % solution Use as directed 15 mLs in the mouth or throat as needed for mouth pain. 100 mL 0  . lidocaine-prilocaine (EMLA) cream Apply 1 application topically as needed. Apply a teaspoon over port site at least 1 hour prior to lab appt. Do not rub in and cover with plastic wrap. 30 g 0  . lisinopril (ZESTRIL) 10 MG tablet TAKE ONE TABLET DAILY (Patient taking differently: Take 10 mg by mouth daily. ) 90 tablet 2  . metFORMIN (GLUCOPHAGE-XR) 750 MG 24 hr tablet TAKE 1 TABLET WITH DINNER (Patient taking differently: Take 750 mg by mouth daily. ) 90 tablet 0  . metoprolol succinate (TOPROL-XL) 25 MG 24 hr tablet Take 1 tablet (25 mg total) by mouth daily. 90 tablet 3  . NON FORMULARY Inhale 1 application into the lungs  at bedtime. CPAP    . ondansetron (ZOFRAN) 4 MG tablet Take 1 tablet (4 mg total) by mouth every 6 (six) hours as needed for nausea. 20 tablet 0  . pantoprazole (PROTONIX) 40 MG tablet Take 1 tablet (40 mg total) by mouth 2 (two) times daily before a meal. 60 tablet 0  . potassium chloride SA (K-DUR,KLOR-CON) 20 MEQ tablet Take 20 mEq by mouth 2 (two) times daily.     . prochlorperazine (COMPAZINE) 10 MG tablet Take 1 tablet (10 mg total) by mouth every 6 (six) hours as needed for nausea or vomiting. 30 tablet 0  . rosuvastatin (CRESTOR) 20 MG tablet Take 20 mg by mouth daily.    . Tamsulosin HCl (FLOMAX) 0.4 MG CAPS Take 0.4 mg by mouth daily.     . vitamin B-12 (CYANOCOBALAMIN) 1000 MCG tablet Take 1,000 mcg by mouth daily.    Marland Kitchen liraglutide (VICTOZA) 18 MG/3ML SOPN Inject 18 mg into the skin daily. Inject 1.11m into skin daily (Patient not taking: Reported on 11/11/2020)     No current facility-administered medications for this visit.    SURGICAL HISTORY:  Past Surgical History:  Procedure Laterality Date  . BACK SURGERY  2002   lumbosacral. 11 back surgeries total  . BRONCHIAL BIOPSY  08/06/2020   Procedure:  BRONCHIAL BIOPSIES;  Surgeon: BCollene Gobble MD;  [facility];  Service: Pulmonary;;  . BRONCHIAL BRUSHINGS  08/06/2020   Procedure: BRONCHIAL BRUSHINGS;  Surgeon: BCollene Gobble MD;  [facility];  Service: Pulmonary;;  . BRONCHIAL NEEDLE ASPIRATION BIOPSY  08/06/2020   Procedure: BRONCHIAL NEEDLE ASPIRATION BIOPSIES;  Surgeon: BCollene Gobble MD;  [facility]: [facility] END[facility];  Service: Pulmonary;;  . Carotid Doppler  03/2013   bilat bulb/prox ICAs - mild amount of fibrous plaque with no evidence of diameter reduction  . CARPAL TUNNEL RELEASE Bilateral 08/09/2014   Procedure: BILATERAL CARPAL TUNNEL RELEASE;  Surgeon: GDaryll Brod MD;  [facility] [facility]  Service: Orthopedics;  Laterality: Bilateral;  ANESTHESIA:  IV REGIONAL BIL FAB  . CHOLECYSTECTOMY    . COLONOSCOPY    . CORONARY ANGIOPLASTY  10/13/1996  . CORONARY ANGIOPLASTY  09/21/1989   emergency PTCA  . CORONARY ANGIOPLASTY  10/13/1996   Multi-Link diagonal & OD stenting (Dr. RMarella Chimes  . CORONARY ANGIOPLASTY  12/03/1997   disease of mid DX-1 ~50% & in mid PLA & PDA (distal lesions) (Dr. RMarella Chimes   . CORONARY ANGIOPLASTY  10/14/1999   progression of disease distal PLA & PDA; progression of disease prox RCA - moderate (Dr. RMarella Chimes   . CORONARY ANGIOPLASTY WITH STENT PLACEMENT  04/04/2004   4.0x245mnon-DES (thrombectomy via AngioJet) to RCA for high grade stenosis (Dr. R.Marella Chimes . CORONARY ARTERY BYPASS GRAFT N/A 03/29/2015   Procedure: CORONARY ARTERY BYPASS GRAFTING TIMES TWO USING LEFT INTERNAL MAMMARY ARTERY AND RIGHT LEG GREATER SAPHENOUS VEIN HARVESTED ENDOSCOPICALLY.;  Surgeon: ClRexene AlbertsMD;  [facility];  Laterality: N/A;  . ESOPHAGOGASTRODUODENOSCOPY N/A 03/27/2015   Procedure: ESOPHAGOGASTRODUODENOSCOPY (EGD);  Surgeon: MaClarene EssexMD;  [facility];  Service: Endoscopy;  Laterality: N/A;  possible dilation  .  ESOPHAGOGASTRODUODENOSCOPY N/A 10/05/2020   Procedure: ESOPHAGOGASTRODUODENOSCOPY (EGD);  Surgeon: OuArta SilenceMD;  [facility];  Service: Endoscopy;  Laterality: N/A;  . FINE NEEDLE ASPIRATION  08/06/2020   Procedure: FINE NEEDLE ASPIRATION (FNA) LINEAR;  Surgeon: ByCollene GobbleMD;  [facility] Service:  Pulmonary;;  . IR IMAGING GUIDED PORT INSERTION  10/07/2020  . LEFT HEART CATHETERIZATION WITH CORONARY ANGIOGRAM N/A 03/28/2015   Procedure: LEFT HEART CATHETERIZATION WITH CORONARY ANGIOGRAM;  Surgeon: Peter M Martinique, MD;  [facility] LAB;  Service: Cardiovascular;  Laterality: N/A;  . SINUS ENDO W/FUSION    . TONSILLECTOMY    . TRANSTHORACIC ECHOCARDIOGRAM  08/08/2013   EF 55-60%, mild conc hypertrophy, grade 1 diastolic dysfunction; AV with mild stenosis; LA & RA mildly dilated  . VIDEO BRONCHOSCOPY WITH ENDOBRONCHIAL NAVIGATION N/A 08/06/2020   Procedure: VIDEO BRONCHOSCOPY WITH ENDOBRONCHIAL NAVIGATION;  Surgeon: Collene Gobble, MD;  Location: [place] ENDOSCOPY;  Service: Pulmonary;  Laterality: N/A;  . VIDEO BRONCHOSCOPY WITH ENDOBRONCHIAL ULTRASOUND N/A 08/06/2020   Procedure: VIDEO BRONCHOSCOPY WITH ENDOBRONCHIAL ULTRASOUND;  Surgeon: Collene Gobble, MD;  [facility];  Service: Pulmonary;  Laterality: N/A;    REVIEW OF SYSTEMS:   Review of Systems  Constitutional:Positive for fatigue. Negative for appetite change, chills, fever and unexpected weight change.  HENT:   Negative for mouth sores, nosebleeds, sore throat and trouble swallowing.   Eyes: Negative for eye problems and icterus.  Respiratory: Positive for dyspnea on exertion. negative for cough, hemoptysis, and wheezing.   Cardiovascular: Negative for chest pain and leg swelling.  Gastrointestinal: Negative for abdominal pain, constipation, diarrhea, nausea and vomiting.  Genitourinary: Negative for bladder incontinence, difficulty urinating, dysuria, frequency and hematuria.    Musculoskeletal: Negative for back pain, gait problem, neck pain and neck stiffness.  Skin: Negative for itching and rash.  Neurological: Negative for dizziness, extremity weakness, gait problem, headaches, light-headedness and seizures.  Hematological: Negative for adenopathy. Does not bruise/bleed easily.  Psychiatric/Behavioral: Negative for confusion, depression and sleep disturbance. The patient is not nervous/anxious.     PHYSICAL EXAMINATION:  Blood pressure (!) 154/65, pulse 77, temperature (!) 97 F (36.1 C), temperature source Tympanic, resp. rate 19, height '5\' 7"'  (1.702 m), weight 248 lb 9.6 oz (112.8 kg), SpO2 91 %.  ECOG PERFORMANCE STATUS: 2 - Symptomatic, <50% confined to bed  Physical Exam  Constitutional: Oriented to person, place, and time and well-developed, well-nourished, and in no distress.  HENT:  Head: Normocephalic and atraumatic.  Mouth/Throat: Oropharynx is clear and moist. No oropharyngeal exudate.  Eyes: Conjunctivae are normal. Right eye exhibits no discharge. Left eye exhibits no discharge. No scleral icterus.  Neck: Normal range of motion. Neck supple.  Cardiovascular: Normal rate, regular rhythm, systolic murmur noted and intact distal pulses.   Pulmonary/Chest: Effort normal. Decreased breath sounds at the base of both lungs. No respiratory distress. No wheezes. No rales.  Abdominal: Soft. Bowel sounds are normal. Exhibits no distension and no mass. There is no tenderness.  Musculoskeletal: Bilateral lower extremity swelling. Normal range of motion.   Lymphadenopathy:    No cervical adenopathy.  Neurological: Alert and oriented to person, place, and time. Exhibits normal muscle tone. Examined in the wheelchair.  Skin: Skin is warm and dry. No rash noted. Not diaphoretic. No erythema. No pallor.  Psychiatric: Mood, memory and judgment normal.  Vitals reviewed.  LABORATORY DATA: Lab Results  Component Value Date   WBC 3.4 (L) 11/11/2020   HGB 9.3  (L) 11/11/2020   HCT 29.4 (L) 11/11/2020   MCV 98.0 11/11/2020   PLT 86 (L) 11/11/2020      Chemistry      Component Value Date/Time   NA 143 11/11/2020 0758   K 4.2 11/11/2020 0758   CL 108 11/11/2020 0758  CO2 21 (L) 11/11/2020 0758   BUN 23 11/11/2020 0758   CREATININE 1.66 (H) 11/11/2020 0758   CREATININE 1.30 (H) 04/30/2020 1044      Component Value Date/Time   CALCIUM 9.2 11/11/2020 0758   ALKPHOS 94 11/11/2020 0758   AST 13 (L) 11/11/2020 0758   ALT 8 11/11/2020 0758   BILITOT 0.6 11/11/2020 0758       RADIOGRAPHIC STUDIES:  DG Chest 2 View  Result Date: 11/05/2020 CLINICAL DATA:  History of metastatic lung carcinoma with shortness of breath. EXAM: CHEST - 2 VIEW COMPARISON:  10/02/2020 FINDINGS: Right-sided chest wall port is noted new from the prior exam. Cardiac shadow remains enlarged. There are persistent mass lesions identified bilaterally but decreased in size when compared with the prior exam. Persistent right-sided pleural effusion is noted with lower lobe consolidation. No pneumothorax is noted. Postsurgical changes are seen. IMPRESSION: Reduction in size of bilateral chest masses. Persistent consolidation in the right base is noted with small associated effusion. Electronically Signed   By: Inez Catalina M.D.   On: 11/05/2020 14:53   CT Chest W Contrast  Result Date: 11/08/2020 CLINICAL DATA:  Non-small cell lung cancer diagnosed 2 months ago. Chemotherapy in progress. Restaging. EXAM: CT CHEST, ABDOMEN, AND PELVIS WITH CONTRAST TECHNIQUE: Multidetector CT imaging of the chest, abdomen and pelvis was performed following the standard protocol during bolus administration of intravenous contrast. CONTRAST:  66m OMNIPAQUE IOHEXOL 300 MG/ML SOLN. Contrast volume reduced due to renal insufficiency with estimated GFR of 41. COMPARISON:  Chest CT 06/21/2020, abdominal CT 09/19/2020 and PET-CT 08/22/2020. FINDINGS: CT CHEST FINDINGS Cardiovascular: Diffuse  atherosclerosis of the aorta, great vessels and coronary arteries status post median sternotomy and CABG. There are dense calcifications of the aortic valve. Right IJ Port-A-Cath extends to superior cavoatrial junction. Stable mild cardiomegaly without significant pericardial fluid. Mediastinum/Nodes: Stable 11 mm pretracheal node on image 22/2, not hypermetabolic on prior PET-CT. Stable left infrahilar fullness. No progressive mediastinal, hilar or axillary lymphadenopathy. Stable 1.6 cm low-density right thyroid nodule on image 4/2, not hypermetabolic on PET-CT and previously evaluated by ultrasound in 2013. The trachea and esophagus demonstrate no significant findings. Lungs/Pleura: Small dependent right-greater-than-left pleural effusions have mildly enlarged. There is no pneumothorax. The large irregular left upper lobe mass which was hypermetabolic on PET-CT appears slightly smaller, now measuring approximately 7.4 x 4.0 cm on image 64/6 (previously up to 8.1 cm). Cavitary mass posteriorly in the right upper lobe also appears slightly smaller, measuring approximately 5.1 x 3.5 cm on image 53/6 (previously 5.7 x 5.7 cm). There is progressive volume loss, parenchymal opacity and bronchiectasis in the right lower lobe. Underlying mild centrilobular emphysema noted. No new nodules identified within the aerated portions of the lungs. Musculoskeletal/Chest wall: No chest wall mass or suspicious osseous findings. CT ABDOMEN AND PELVIS FINDINGS Hepatobiliary: The liver is normal in density without suspicious focal abnormality. Mild periportal edema. No significant biliary dilatation post cholecystectomy. Pancreas: Unremarkable. No pancreatic ductal dilatation or surrounding inflammatory changes. Spleen: Normal in size without focal abnormality. Adrenals/Urinary Tract: Both adrenal glands appear normal. There is suspicion of an enhancing mass in the interpolar region of the left kidney, most obvious on the coronal  images. This measures approximately 2.0 cm on image 74/2 and is relatively hypodense on the delayed post-contrast images. Stable 4.8 cm cyst in the lower pole of the right kidney. There are additional subcentimeter low-density renal lesions bilaterally which are likely all cysts. No evidence of urinary tract calculus or  hydronephrosis. The bladder appears unremarkable. Stomach/Bowel: The stomach is incompletely distended, likely accounting for diffuse wall thickening. The previously demonstrated wall thickening and inflammation surrounding the proximal duodenum have resolved. No other significant bowel wall thickening, distention or surrounding inflammation. The appendix appears normal. A moderate amount of stool is present throughout the colon. Vascular/Lymphatic: There are no enlarged abdominal or pelvic lymph nodes. Aortic and branch vessel atherosclerosis without acute vascular findings. The portal, superior mesenteric and splenic veins are patent. Reproductive: Stable mild enlargement of the prostate gland. Other: No ascites or peritoneal nodularity. Musculoskeletal: At large partially calcified soft tissue mass within the subcutaneous fat and gluteus maximus muscle posterior to the left sacrum and iliac bone is unchanged. This was not significantly hypermetabolic on PET-CT. There are smaller contralateral calcifications on the right. Multilevel spondylosis noted. IMPRESSION: 1. Interval slight decrease in size of bilateral upper lobe lung masses consistent with response to therapy. These masses are not well defined, in part due to increasing atelectasis and bilateral pleural effusions. 2. No evidence of progressive thoracic disease. No new pulmonary nodules identified. 3. No evidence of metastatic disease within the abdomen or pelvis. 4. Probable enhancing mass in the interpolar region of the left kidney, suspicious for primary renal cell carcinoma. 5. Stable partially calcified soft tissue mass within the  left buttocks compared with recent prior imaging. This was partially imaged on abdominal CT performed 05/12/2007 and appeared fatty. Etiology for this is uncertain, and potentially this represents chronic fat necrosis although a liposarcoma cannot be excluded. Metastatic lung cancer unlikely. 6. Aortic Atherosclerosis (ICD10-I70.0). Electronically Signed   By: Richardean Sale M.D.   On: 11/08/2020 13:33   CT Abdomen Pelvis W Contrast  Result Date: 11/08/2020 CLINICAL DATA:  Non-small cell lung cancer diagnosed 2 months ago. Chemotherapy in progress. Restaging. EXAM: CT CHEST, ABDOMEN, AND PELVIS WITH CONTRAST TECHNIQUE: Multidetector CT imaging of the chest, abdomen and pelvis was performed following the standard protocol during bolus administration of intravenous contrast. CONTRAST:  50m OMNIPAQUE IOHEXOL 300 MG/ML SOLN. Contrast volume reduced due to renal insufficiency with estimated GFR of 41. COMPARISON:  Chest CT 06/21/2020, abdominal CT 09/19/2020 and PET-CT 08/22/2020. FINDINGS: CT CHEST FINDINGS Cardiovascular: Diffuse atherosclerosis of the aorta, great vessels and coronary arteries status post median sternotomy and CABG. There are dense calcifications of the aortic valve. Right IJ Port-A-Cath extends to superior cavoatrial junction. Stable mild cardiomegaly without significant pericardial fluid. Mediastinum/Nodes: Stable 11 mm pretracheal node on image 22/2, not hypermetabolic on prior PET-CT. Stable left infrahilar fullness. No progressive mediastinal, hilar or axillary lymphadenopathy. Stable 1.6 cm low-density right thyroid nodule on image 4/2, not hypermetabolic on PET-CT and previously evaluated by ultrasound in 2013. The trachea and esophagus demonstrate no significant findings. Lungs/Pleura: Small dependent right-greater-than-left pleural effusions have mildly enlarged. There is no pneumothorax. The large irregular left upper lobe mass which was hypermetabolic on PET-CT appears slightly  smaller, now measuring approximately 7.4 x 4.0 cm on image 64/6 (previously up to 8.1 cm). Cavitary mass posteriorly in the right upper lobe also appears slightly smaller, measuring approximately 5.1 x 3.5 cm on image 53/6 (previously 5.7 x 5.7 cm). There is progressive volume loss, parenchymal opacity and bronchiectasis in the right lower lobe. Underlying mild centrilobular emphysema noted. No new nodules identified within the aerated portions of the lungs. Musculoskeletal/Chest wall: No chest wall mass or suspicious osseous findings. CT ABDOMEN AND PELVIS FINDINGS Hepatobiliary: The liver is normal in density without suspicious focal abnormality. Mild periportal edema. No significant  biliary dilatation post cholecystectomy. Pancreas: Unremarkable. No pancreatic ductal dilatation or surrounding inflammatory changes. Spleen: Normal in size without focal abnormality. Adrenals/Urinary Tract: Both adrenal glands appear normal. There is suspicion of an enhancing mass in the interpolar region of the left kidney, most obvious on the coronal images. This measures approximately 2.0 cm on image 74/2 and is relatively hypodense on the delayed post-contrast images. Stable 4.8 cm cyst in the lower pole of the right kidney. There are additional subcentimeter low-density renal lesions bilaterally which are likely all cysts. No evidence of urinary tract calculus or hydronephrosis. The bladder appears unremarkable. Stomach/Bowel: The stomach is incompletely distended, likely accounting for diffuse wall thickening. The previously demonstrated wall thickening and inflammation surrounding the proximal duodenum have resolved. No other significant bowel wall thickening, distention or surrounding inflammation. The appendix appears normal. A moderate amount of stool is present throughout the colon. Vascular/Lymphatic: There are no enlarged abdominal or pelvic lymph nodes. Aortic and branch vessel atherosclerosis without acute vascular  findings. The portal, superior mesenteric and splenic veins are patent. Reproductive: Stable mild enlargement of the prostate gland. Other: No ascites or peritoneal nodularity. Musculoskeletal: At large partially calcified soft tissue mass within the subcutaneous fat and gluteus maximus muscle posterior to the left sacrum and iliac bone is unchanged. This was not significantly hypermetabolic on PET-CT. There are smaller contralateral calcifications on the right. Multilevel spondylosis noted. IMPRESSION: 1. Interval slight decrease in size of bilateral upper lobe lung masses consistent with response to therapy. These masses are not well defined, in part due to increasing atelectasis and bilateral pleural effusions. 2. No evidence of progressive thoracic disease. No new pulmonary nodules identified. 3. No evidence of metastatic disease within the abdomen or pelvis. 4. Probable enhancing mass in the interpolar region of the left kidney, suspicious for primary renal cell carcinoma. 5. Stable partially calcified soft tissue mass within the left buttocks compared with recent prior imaging. This was partially imaged on abdominal CT performed 05/12/2007 and appeared fatty. Etiology for this is uncertain, and potentially this represents chronic fat necrosis although a liposarcoma cannot be excluded. Metastatic lung cancer unlikely. 6. Aortic Atherosclerosis (ICD10-I70.0). Electronically Signed   By: Richardean Sale M.D.   On: 11/08/2020 13:33     ASSESSMENT/PLAN:  This is a very pleasant 79 year old Caucasian male diagnosed with stage IV (T4, N2, M1A) non-small cell lung cancer, mucinous adenocarcinoma. The patient presented with bilateral large masses in the left upper lobe and the right upper lobe as well as other pulmonary nodules in the right upper lobe with hilar and mediastinal lymphadenopathy. The patient was diagnosed in August 2021. The patient's PD-L1 expression is 20%. The patient's molecular studies show  that he is positive for K-ras G12 C mutation which may be a targetable mutation in the second line setting in the future.  The patient is currently undergoing palliative systemic chemotherapy with carboplatin for an AUC of 5, Alimta 500 mg per metered squared, Keytruda 200 mg IV every 3 weeks. He is status post 3 cycles. For cycle #3, Alimta was held due to elevated creatinine. The patient's carboplatin was also reduced to an AUC of 4.  The patient recently had a restaging CT scan performed. Dr. Julien Nordmann personally and independently reviewed the scan and discussed the results with the patient today. The scan showed a positive response to therapy with a decrease in size of the lung masses. Dr. Julien Nordmann recommends that the patient proceed with cycle 4 today as scheduled; however, given his elevated  creatinine and pancytopenia, Dr. Julien Nordmann recommends that he proceed with single agent immunotherapy with [facility] today.   We will see the patient back for follow-up visit in 3 weeks for evaluation before starting cycle #5.  The scan noted an enhancing lesion in the kidney. Dr. Julien Nordmann recommends that we continue to monitor this for now.  The patient is in the process of having home oxygen set up at his house.   The patient was advised to call immediately if she has any concerning symptoms in the interval. The patient voices understanding of current disease status and treatment options and is in agreement with the current care plan. All questions were answered. The patient knows to call the clinic with any problems, questions or concerns. We can certainly see the patient much sooner if necessary  No orders of the defined types were placed in this encounter.   Julann Mcgilvray L Angelie Kram, PA-C 11/11/20  ADDENDUM: Hematology/Oncology Attending: I had a face-to-face encounter with the patient today.  I recommended his care plan.  This is a very pleasant 79 years old white male diagnosed with a stage IV  non-small cell lung cancer, mucinous adenocarcinoma with no KRAS G12C and PD-L1 expression of 20%.  The patient started induction systemic chemotherapy with carboplatin, Alimta and Keytruda but Alimta was discontinued secondary to worsening renal insufficiency.  He completed 3 cycles of his treatment.  He continues to tolerate the treatment well except for fatigue and weakness. He had repeat CT scan of the chest, abdomen pelvis performed recently.  I personally and independently reviewed the scans and discussed the results with the patient and his wife. His scan showed no concerning findings for disease progression and there was improvement and decrease in the size of the lung masses. I recommended for the patient to continue his treatment but only with single agent Keytruda at this point because of his renal insufficiency and the intolerability to the chemotherapy. He will proceed with cycle #4 today as planned. We will see him back for follow-up visit in 3 weeks for evaluation before the next cycle of his treatment. He was advised to call immediately if he has any concerning symptoms in the interval.  Disclaimer: This note was dictated with voice recognition software. Similar sounding words can inadvertently be transcribed and may be missed upon review. Eilleen Kempf, MD 11/11/20

## 2020-11-09 NOTE — Progress Notes (Signed)
[facility] Progress Note   Randall Wilkins [id number] 07/31/1941 79 y.o.  Randall Wilkins is managed by Dr. Fanny Bien. Randall Wilkins  Actively treated with chemotherapy/immunotherapy/hormonal therapy: yes  Current therapy: Carboplatin and Keytruda  Last treated: 10/21/2020 (cycle 3, day 1)  Next scheduled appointment with provider: 11/11/2020  Assessment: Plan:    Bradycardia - Plan: EKG 12-Lead  Shortness of breath - Plan: DG Chest 2 View, For home use only DME oxygen  Hypoxia - Plan: For home use only DME oxygen  Adenocarcinoma of right lung, stage 4 ([place]) - Plan: For home use only DME oxygen   Bradycardia: The patient's vital signs showed a pulse of 57.  An EKG was completed which showed sinus bradycardia with first-degree AV block at 53 bpm.  Also noted were frequent PVCs.  This EKG was compared with prior EKGs and was found to be consistent with those studies.   Shortness of breath with hypoxia: Randall Wilkins was referred for a chest x-ray which returned showing:  FINDINGS: Right-sided chest wall port is noted new from the prior exam. Cardiac shadow remains enlarged.  There are persistent mass lesions identified bilaterally but decreased in size when compared with the prior exam.  Persistent right-sided pleural effusion is noted with lower lobe consolidation.  No pneumothorax is noted.  Postsurgical changes are seen.  IMPRESSION: Reduction in size of bilateral chest masses.  Persistent consolidation in the right base is noted with small associated effusion.  The patient's oxygen saturation was checked and was found to be:  Room air/at rest:  93% Room air/at exertion: 85% On 2 Wilkins/min with exertion: 97%  An order was placed and a fax was sent with confirmation received to [facility] for home oxygen.  The facility was also called at [phone] regarding this order.  The office was spoken to and advised that I sent a fax copy of the order to their office.   This was done as requested.   Stage IV adenocarcinoma of the right lung: Randall Wilkins is scheduled to have restaging CT scans completed on 11/08/2020 and is scheduled to be seen in follow-up on 11/11/2020.  Please see After Visit Summary for patient specific instructions.  Future Appointments  Date Time Provider [place]  11/11/2020  8:00 AM [facility] None  11/11/2020  8:30 AM Randall Wilkins, Randall L, PA-C [facility] None  11/11/2020  9:30 AM [facility] INFUSION [facility] None  11/18/2020 10:15 AM [facility] None  11/25/2020 10:15 AM [facility] None  11/26/2020 10:30 AM Deveshwar, Abel Presto, MD [facility] None  12/02/2020  8:30 AM [facility] LAB [facility] None  12/02/2020  9:00 AM Randall Wilkins, Randall L, PA-C [facility] None  12/02/2020 10:00 AM [facility] INFUSION [facility] None  12/09/2020 10:30 AM [facility] LAB [facility] None  12/16/2020 10:30 AM [facility] None  12/23/2020 11:30 AM Marcial Pacas, MD [facility] None  12/24/2020  8:30 AM [facility] None  12/24/2020  9:00 AM Curt Bears, MD [facility] None  12/24/2020 10:00 AM [facility] INFUSION [facility] None  01/06/2021  9:00 AM [facility] LAB [facility] None  01/09/2021  1:00 PM Elayne Snare, MD [facility] None  01/21/2021  8:45 AM Hilty, Nadean Corwin, MD [facility]    Orders Placed This Encounter  Procedures  . For home use only DME oxygen  . DG Chest 2 View  . EKG 12-Lead       Subjective:   Patient ID:  Randall Wilkins is a 79 y.o. (DOB 07-May-1941)  male.  Chief Complaint: No chief complaint on file.   HPI Randall Wilkins  is a 79 y.o. male with a diagnosis of a stage IV adenocarcinoma of the right lung.  He is managed by Dr. Julien Nordmann and is status post cycle 3, day 1 of carboplatin and Keytruda which was dosed on 10/21/2020.  He is scheduled to have restaging CT scans completed on 11/08/2020 and then  will follow up on 11/11/2020.  He has a history of a non-ST elevated myocardial infarction and has a history of a cerebral infarction.  He continues on Eliquis at 2.5 mg twice daily.  He was previously on 5 mg twice daily but had this changed to a renal dosing of 2.5 mg twice daily 2 weeks ago.  He has not missed any of his medications.  He presents to the clinic today with shortness of breath which has been increasing in severity since this morning.  His oxygen saturations at home have ranged from 85 to 92%.  He presents with his wife.  He denies fevers, chills, sweats, or a cough.  She reports that he stays cold.  His hemoglobin returned at 8.5 today.  He was referred for a chest x-ray today which returned showing:  FINDINGS: Right-sided chest wall port is noted new from the prior exam. Cardiac shadow remains enlarged.  There are persistent mass lesions identified bilaterally but decreased in size when compared with the prior exam.  Persistent right-sided pleural effusion is noted with lower lobe consolidation.  No pneumothorax is noted.  Postsurgical changes are seen.  IMPRESSION: Reduction in size of bilateral chest masses.  Persistent consolidation in the right base is noted with small associated effusion.   Medications: I have reviewed the patient's current medications.  Allergies:  Allergies  Allergen Reactions  . Actos [Pioglitazone] Swelling  . Metformin And Related Nausea Only  . Niaspan [Niacin Er] Itching and Rash    Past Medical History:  Diagnosis Date  . Aortic stenosis, mild 11/14/2013  . Aortic valve sclerosis 03/29/2015  . Bilateral leg edema 05/21/2014  . CAD (coronary artery disease)   . Chronic diastolic congestive heart failure ([place])   . Chronic kidney disease (CKD), stage III (moderate) ([facility])   . Diabetes 1.5, managed as type 1 ([place]) 02/04/2013  . Diabetic peripheral neuropathy associated with type 1 diabetes mellitus ([place]) 11/14/2013  . Dysphagia   . Dyspnea on  exertion 03/21/2015  . Exogenous obesity   . Gout   . Heart attack ([place])   . History of nuclear stress test 07/2011   dipyridamole; fixed inferolateral defect, worse at stress than rest; no reversible ischemia; low risk scan   . Hyperlipidemia   . Hypertension   . Hypothyroidism   . Insulin dependent diabetes mellitus   . Left foot drop   . Left main coronary artery disease 03/28/2015  . Memory loss   . Obesity (BMI 30-39.9) 11/14/2013  . Obstructive sleep apnea 03/21/2015  . OSA on CPAP    uses a cpap  . Peptic ulcer with hemorrhage 03/28/2015  . Peripheral neuropathy   . Pneumonia   . Rhabdomyolysis   . 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 Stroke ([place])    Wilkins patietal with small scattered lacunar infarcts  . Thrombocytopenia ([place]) 03/21/2015  . Venous insufficiency   . Weakness generalized 03/21/2015    Past Surgical History:  Procedure Laterality Date  . BACK SURGERY  2002   lumbosacral. 11 back surgeries total  . BRONCHIAL BIOPSY  08/06/2020   Procedure: BRONCHIAL BIOPSIES;  Surgeon: Collene Gobble, MD;  Location: [facility] ENDOSCOPY;  Service: Pulmonary;;  . BRONCHIAL BRUSHINGS  08/06/2020   Procedure: BRONCHIAL BRUSHINGS;  Surgeon: Collene Gobble, MD;  Location[facility] ENDOSCOPY;  Service: Pulmonary;;  . BRONCHIAL NEEDLE ASPIRATION BIOPSY  08/06/2020   Procedure: BRONCHIAL NEEDLE ASPIRATION BIOPSIES;  Surgeon: Collene Gobble, MD;  Location: [facility] ENDOSCOPY;  Service: Pulmonary;;  . Carotid Doppler  03/2013   bilat bulb/prox ICAs - mild amount of fibrous plaque with no evidence of diameter reduction  . CARPAL TUNNEL RELEASE Bilateral 08/09/2014   Procedure: BILATERAL CARPAL TUNNEL RELEASE;  Surgeon: Daryll Brod, MD;  Location: [place];  Service: Orthopedics;  Laterality: Bilateral;  ANESTHESIA:  IV REGIONAL BIL FAB  . CHOLECYSTECTOMY    . COLONOSCOPY    . CORONARY ANGIOPLASTY  10/13/1996  . CORONARY ANGIOPLASTY  09/21/1989   emergency  PTCA  . CORONARY ANGIOPLASTY  10/13/1996   Multi-Link diagonal & OD stenting (Dr. Marella Chimes)  . CORONARY ANGIOPLASTY  12/03/1997   disease of mid DX-1 ~50% & in mid PLA & PDA (distal lesions) (Dr. Marella Chimes)   . CORONARY ANGIOPLASTY  10/14/1999   progression of disease distal PLA & PDA; progression of disease prox RCA - moderate (Dr. Marella Chimes)   . CORONARY ANGIOPLASTY WITH STENT PLACEMENT  04/04/2004   4.0x57mm non-DES (thrombectomy via AngioJet) to RCA for high grade stenosis (Dr. Marella Chimes)  . CORONARY ARTERY BYPASS GRAFT N/A 03/29/2015   Procedure: CORONARY ARTERY BYPASS GRAFTING TIMES TWO USING LEFT INTERNAL MAMMARY ARTERY AND RIGHT LEG GREATER SAPHENOUS VEIN HARVESTED ENDOSCOPICALLY.;  Surgeon: Rexene Alberts, MD;  Location: [place];  Service: Open Heart Surgery;  Laterality: N/A;  . ESOPHAGOGASTRODUODENOSCOPY N/A 03/27/2015   Procedure: ESOPHAGOGASTRODUODENOSCOPY (EGD);  Surgeon: Clarene Essex, MD;  Location: [facility] ENDOSCOPY;  Service: Endoscopy;  Laterality: N/A;  possible dilation  . ESOPHAGOGASTRODUODENOSCOPY N/A 10/05/2020   Procedure: ESOPHAGOGASTRODUODENOSCOPY (EGD);  Surgeon: Arta Silence, MD;  Location: [facility] ENDOSCOPY;  Service: Endoscopy;  Laterality: N/A;  . FINE NEEDLE ASPIRATION  08/06/2020   Procedure: FINE NEEDLE ASPIRATION (FNA) LINEAR;  Surgeon: Collene Gobble, MD;  Location: [facility] ENDOSCOPY;  Service: Pulmonary;;  . IR IMAGING GUIDED PORT INSERTION  10/07/2020  . LEFT HEART CATHETERIZATION WITH CORONARY ANGIOGRAM N/A 03/28/2015   Procedure: LEFT HEART CATHETERIZATION WITH CORONARY ANGIOGRAM;  Surgeon: Peter M Martinique, MD;  Location: [facility] CATH LAB;  Service: Cardiovascular;  Laterality: N/A;  . SINUS ENDO W/FUSION    . TONSILLECTOMY    . TRANSTHORACIC ECHOCARDIOGRAM  08/08/2013   EF 55-60%, mild conc hypertrophy, grade 1 diastolic dysfunction; AV with mild stenosis; LA & RA mildly dilated  . VIDEO BRONCHOSCOPY WITH ENDOBRONCHIAL NAVIGATION N/A 08/06/2020   Procedure: VIDEO  BRONCHOSCOPY WITH ENDOBRONCHIAL NAVIGATION;  Surgeon: Collene Gobble, MD;  Location: [facility] ENDOSCOPY;  Service: Pulmonary;  Laterality: N/A;  . VIDEO BRONCHOSCOPY WITH ENDOBRONCHIAL ULTRASOUND N/A 08/06/2020   Procedure: VIDEO BRONCHOSCOPY WITH ENDOBRONCHIAL ULTRASOUND;  Surgeon: Collene Gobble, MD;  Location: [facility] ENDOSCOPY;  Service: Pulmonary;  Laterality: N/A;    Family History  Problem Relation Age of Onset  . Heart disease Mother   . Coronary artery disease Father   . Cancer Maternal Grandmother   . Heart Problems Maternal Grandfather   . Diabetes Son        borderline     Social History  Socioeconomic History  . Marital status: Married    Spouse name: Not on file  . Number of children: 3  . Years of education: 83  . Highest education level: Not on file  Occupational History  . Not on file  Tobacco Use  . Smoking status: Former Smoker    Packs/day: 2.00    Years: 20.00    Pack years: 40.00    Types: Cigarettes    Quit date: 01/25/1974    Years since quitting: 46.8  . Smokeless tobacco: Never Used  Vaping Use  . Vaping Use: Never used  Substance and Sexual Activity  . Alcohol use: No    Alcohol/week: 0.0 standard drinks  . Drug use: No  . Sexual activity: Not on file  Other Topics Concern  . Not on file  Social History Narrative   Lives with wife.    Social Determinants of Health   Financial Resource Strain:   . Difficulty of Paying Living Expenses: Not on file  Food Insecurity:   . Worried About Charity fundraiser in the Last Year: Not on file  . Ran Out of Food in the Last Year: Not on file  Transportation Needs:   . Lack of Transportation (Medical): Not on file  . Lack of Transportation (Non-Medical): Not on file  Physical Activity:   . Days of Exercise per Week: Not on file  . Minutes of Exercise per Session: Not on file  Stress:   . Feeling of Stress : Not on file  Social Connections:   . Frequency of Communication with Friends and Family: Not on  file  . Frequency of Social Gatherings with Friends and Family: Not on file  . Attends Religious Services: Not on file  . Active Member of Clubs or Organizations: Not on file  . Attends Archivist Meetings: Not on file  . Marital Status: Not on file  Intimate Partner Violence:   . Fear of Current or Ex-Partner: Not on file  . Emotionally Abused: Not on file  . Physically Abused: Not on file  . Sexually Abused: Not on file    Past Medical History, Surgical history, Social history, and Family history were reviewed and updated as appropriate.   Please see review of systems for further details on the patient's review from today.   Review of Systems:  Review of Systems  Constitutional: Negative for chills, diaphoresis, fatigue and fever.  HENT: Negative for congestion, postnasal drip, rhinorrhea and sore throat.   Respiratory: Positive for shortness of breath. Negative for cough, chest tightness and wheezing.   Cardiovascular: Negative for palpitations.  Gastrointestinal: Negative for constipation, diarrhea, nausea and vomiting.  Neurological: Negative for headaches.    Objective:   Physical Exam:  BP 135/61 (BP Location: Left Arm, Patient Position: Sitting)   Pulse (!) 57   Temp 97.7 F (36.5 C) (Tympanic)   Resp 19   Ht 5\' 7"  (1.702 m)   Wt 244 lb (110.7 kg)   SpO2 (!) 87%   BMI 38.22 kg/m  ECOG: 1  Physical Exam Constitutional:      General: He is not in acute distress.    Appearance: He is not diaphoretic.  HENT:     Head: Normocephalic and atraumatic.     Right Ear: External ear normal.     Left Ear: External ear normal.     Mouth/Throat:     Pharynx: No oropharyngeal exudate.  Eyes:     General: No scleral icterus.  Right eye: No discharge.        Left eye: No discharge.     Conjunctiva/sclera: Conjunctivae normal.  Cardiovascular:     Rate and Rhythm: Normal rate and regular rhythm.     Heart sounds: Normal heart sounds. No murmur heard.    No friction rub. No gallop.   Pulmonary:     Effort: Pulmonary effort is normal. No respiratory distress.     Breath sounds: Examination of the right-upper field reveals wheezing. Examination of the left-upper field reveals wheezing. Examination of the right-middle field reveals wheezing. Examination of the left-middle field reveals wheezing. Examination of the right-lower field reveals wheezing. Examination of the left-lower field reveals wheezing. Wheezing present. No rales.  Musculoskeletal:     Cervical back: Normal range of motion and neck supple.  Lymphadenopathy:     Cervical: No cervical adenopathy.  Skin:    General: Skin is warm and dry.     Findings: No erythema or rash.  Neurological:     Mental Status: He is alert.     Coordination: Coordination normal.  Psychiatric:        Behavior: Behavior normal.        Thought Content: Thought content normal.        Judgment: Judgment normal.     Lab Review:     Component Value Date/Time   NA 142 11/04/2020 0947   K 4.4 11/04/2020 0947   CL 108 11/04/2020 0947   CO2 25 11/04/2020 0947   GLUCOSE 128 (H) 11/04/2020 0947   BUN 21 11/04/2020 0947   CREATININE 1.68 (H) 11/04/2020 0947   CREATININE 1.30 (H) 04/30/2020 1044   CALCIUM 8.8 (Wilkins) 11/04/2020 0947   PROT 6.2 (Wilkins) 11/04/2020 0947   ALBUMIN 3.1 (Wilkins) 11/04/2020 0947   AST 15 11/04/2020 0947   ALT 9 11/04/2020 0947   ALKPHOS 78 11/04/2020 0947   BILITOT 0.5 11/04/2020 0947   GFRNONAA 41 (Wilkins) 11/04/2020 0947   GFRNONAA 52 (Wilkins) 04/30/2020 1044   GFRAA >60 09/16/2020 0907   GFRAA 60 04/30/2020 1044       Component Value Date/Time   WBC 4.2 11/05/2020 1234   WBC 2.1 (Wilkins) 10/08/2020 0453   RBC 2.75 (Wilkins) 11/05/2020 1234   HGB 8.5 (Wilkins) 11/05/2020 1234   HCT 26.1 (Wilkins) 11/05/2020 1234   PLT 41 (Wilkins) 11/05/2020 1234   MCV 94.9 11/05/2020 1234   MCH 30.9 11/05/2020 1234   MCHC 32.6 11/05/2020 1234   RDW 20.4 (H) 11/05/2020 1234   LYMPHSABS 0.9 11/05/2020 1234   MONOABS 0.6  11/05/2020 1234   EOSABS 0.0 11/05/2020 1234   BASOSABS 0.1 11/05/2020 1234   -------------------------------  Imaging from last 24 hours (if applicable):  Radiology interpretation: DG Chest 2 View  Result Date: 11/05/2020 CLINICAL DATA:  History of metastatic lung carcinoma with shortness of breath. EXAM: CHEST - 2 VIEW COMPARISON:  10/02/2020 FINDINGS: Right-sided chest wall port is noted new from the prior exam. Cardiac shadow remains enlarged. There are persistent mass lesions identified bilaterally but decreased in size when compared with the prior exam. Persistent right-sided pleural effusion is noted with lower lobe consolidation. No pneumothorax is noted. Postsurgical changes are seen. IMPRESSION: Reduction in size of bilateral chest masses. Persistent consolidation in the right base is noted with small associated effusion. Electronically Signed   By: Inez Catalina M.D.   On: 11/05/2020 14:53   CT Chest W Contrast  Result Date: 11/08/2020 CLINICAL DATA:  Non-small cell lung cancer diagnosed  2 months ago. Chemotherapy in progress. Restaging. EXAM: CT CHEST, ABDOMEN, AND PELVIS WITH CONTRAST TECHNIQUE: Multidetector CT imaging of the chest, abdomen and pelvis was performed following the standard protocol during bolus administration of intravenous contrast. CONTRAST:  37mL OMNIPAQUE IOHEXOL 300 MG/ML SOLN. Contrast volume reduced due to renal insufficiency with estimated GFR of 41. COMPARISON:  Chest CT 06/21/2020, abdominal CT 09/19/2020 and PET-CT 08/22/2020. FINDINGS: CT CHEST FINDINGS Cardiovascular: Diffuse atherosclerosis of the aorta, great vessels and coronary arteries status post median sternotomy and CABG. There are dense calcifications of the aortic valve. Right IJ Port-A-Cath extends to superior cavoatrial junction. Stable mild cardiomegaly without significant pericardial fluid. Mediastinum/Nodes: Stable 11 mm pretracheal node on image 22/2, not hypermetabolic on prior PET-CT. Stable  left infrahilar fullness. No progressive mediastinal, hilar or axillary lymphadenopathy. Stable 1.6 cm low-density right thyroid nodule on image 4/2, not hypermetabolic on PET-CT and previously evaluated by ultrasound in 2013. The trachea and esophagus demonstrate no significant findings. Lungs/Pleura: Small dependent right-greater-than-left pleural effusions have mildly enlarged. There is no pneumothorax. The large irregular left upper lobe mass which was hypermetabolic on PET-CT appears slightly smaller, now measuring approximately 7.4 x 4.0 cm on image 64/6 (previously up to 8.1 cm). Cavitary mass posteriorly in the right upper lobe also appears slightly smaller, measuring approximately 5.1 x 3.5 cm on image 53/6 (previously 5.7 x 5.7 cm). There is progressive volume loss, parenchymal opacity and bronchiectasis in the right lower lobe. Underlying mild centrilobular emphysema noted. No new nodules identified within the aerated portions of the lungs. Musculoskeletal/Chest wall: No chest wall mass or suspicious osseous findings. CT ABDOMEN AND PELVIS FINDINGS Hepatobiliary: The liver is normal in density without suspicious focal abnormality. Mild periportal edema. No significant biliary dilatation post cholecystectomy. Pancreas: Unremarkable. No pancreatic ductal dilatation or surrounding inflammatory changes. Spleen: Normal in size without focal abnormality. Adrenals/Urinary Tract: Both adrenal glands appear normal. There is suspicion of an enhancing mass in the interpolar region of the left kidney, most obvious on the coronal images. This measures approximately 2.0 cm on image 74/2 and is relatively hypodense on the delayed post-contrast images. Stable 4.8 cm cyst in the lower pole of the right kidney. There are additional subcentimeter low-density renal lesions bilaterally which are likely all cysts. No evidence of urinary tract calculus or hydronephrosis. The bladder appears unremarkable. Stomach/Bowel: The  stomach is incompletely distended, likely accounting for diffuse wall thickening. The previously demonstrated wall thickening and inflammation surrounding the proximal duodenum have resolved. No other significant bowel wall thickening, distention or surrounding inflammation. The appendix appears normal. A moderate amount of stool is present throughout the colon. Vascular/Lymphatic: There are no enlarged abdominal or pelvic lymph nodes. Aortic and branch vessel atherosclerosis without acute vascular findings. The portal, superior mesenteric and splenic veins are patent. Reproductive: Stable mild enlargement of the prostate gland. Other: No ascites or peritoneal nodularity. Musculoskeletal: At large partially calcified soft tissue mass within the subcutaneous fat and gluteus maximus muscle posterior to the left sacrum and iliac bone is unchanged. This was not significantly hypermetabolic on PET-CT. There are smaller contralateral calcifications on the right. Multilevel spondylosis noted. IMPRESSION: 1. Interval slight decrease in size of bilateral upper lobe lung masses consistent with response to therapy. These masses are not well defined, in part due to increasing atelectasis and bilateral pleural effusions. 2. No evidence of progressive thoracic disease. No new pulmonary nodules identified. 3. No evidence of metastatic disease within the abdomen or pelvis. 4. Probable enhancing mass in the interpolar  region of the left kidney, suspicious for primary renal cell carcinoma. 5. Stable partially calcified soft tissue mass within the left buttocks compared with recent prior imaging. This was partially imaged on abdominal CT performed 05/12/2007 and appeared fatty. Etiology for this is uncertain, and potentially this represents chronic fat necrosis although a liposarcoma cannot be excluded. Metastatic lung cancer unlikely. 6. Aortic Atherosclerosis (ICD10-I70.0). Electronically Signed   By: Richardean Sale M.D.   On:  11/08/2020 13:33   CT Abdomen Pelvis W Contrast  Result Date: 11/08/2020 CLINICAL DATA:  Non-small cell lung cancer diagnosed 2 months ago. Chemotherapy in progress. Restaging. EXAM: CT CHEST, ABDOMEN, AND PELVIS WITH CONTRAST TECHNIQUE: Multidetector CT imaging of the chest, abdomen and pelvis was performed following the standard protocol during bolus administration of intravenous contrast. CONTRAST:  49mL OMNIPAQUE IOHEXOL 300 MG/ML SOLN. Contrast volume reduced due to renal insufficiency with estimated GFR of 41. COMPARISON:  Chest CT 06/21/2020, abdominal CT 09/19/2020 and PET-CT 08/22/2020. FINDINGS: CT CHEST FINDINGS Cardiovascular: Diffuse atherosclerosis of the aorta, great vessels and coronary arteries status post median sternotomy and CABG. There are dense calcifications of the aortic valve. Right IJ Port-A-Cath extends to superior cavoatrial junction. Stable mild cardiomegaly without significant pericardial fluid. Mediastinum/Nodes: Stable 11 mm pretracheal node on image 22/2, not hypermetabolic on prior PET-CT. Stable left infrahilar fullness. No progressive mediastinal, hilar or axillary lymphadenopathy. Stable 1.6 cm low-density right thyroid nodule on image 4/2, not hypermetabolic on PET-CT and previously evaluated by ultrasound in 2013. The trachea and esophagus demonstrate no significant findings. Lungs/Pleura: Small dependent right-greater-than-left pleural effusions have mildly enlarged. There is no pneumothorax. The large irregular left upper lobe mass which was hypermetabolic on PET-CT appears slightly smaller, now measuring approximately 7.4 x 4.0 cm on image 64/6 (previously up to 8.1 cm). Cavitary mass posteriorly in the right upper lobe also appears slightly smaller, measuring approximately 5.1 x 3.5 cm on image 53/6 (previously 5.7 x 5.7 cm). There is progressive volume loss, parenchymal opacity and bronchiectasis in the right lower lobe. Underlying mild centrilobular emphysema noted.  No new nodules identified within the aerated portions of the lungs. Musculoskeletal/Chest wall: No chest wall mass or suspicious osseous findings. CT ABDOMEN AND PELVIS FINDINGS Hepatobiliary: The liver is normal in density without suspicious focal abnormality. Mild periportal edema. No significant biliary dilatation post cholecystectomy. Pancreas: Unremarkable. No pancreatic ductal dilatation or surrounding inflammatory changes. Spleen: Normal in size without focal abnormality. Adrenals/Urinary Tract: Both adrenal glands appear normal. There is suspicion of an enhancing mass in the interpolar region of the left kidney, most obvious on the coronal images. This measures approximately 2.0 cm on image 74/2 and is relatively hypodense on the delayed post-contrast images. Stable 4.8 cm cyst in the lower pole of the right kidney. There are additional subcentimeter low-density renal lesions bilaterally which are likely all cysts. No evidence of urinary tract calculus or hydronephrosis. The bladder appears unremarkable. Stomach/Bowel: The stomach is incompletely distended, likely accounting for diffuse wall thickening. The previously demonstrated wall thickening and inflammation surrounding the proximal duodenum have resolved. No other significant bowel wall thickening, distention or surrounding inflammation. The appendix appears normal. A moderate amount of stool is present throughout the colon. Vascular/Lymphatic: There are no enlarged abdominal or pelvic lymph nodes. Aortic and branch vessel atherosclerosis without acute vascular findings. The portal, superior mesenteric and splenic veins are patent. Reproductive: Stable mild enlargement of the prostate gland. Other: No ascites or peritoneal nodularity. Musculoskeletal: At large partially calcified soft tissue mass within the  subcutaneous fat and gluteus maximus muscle posterior to the left sacrum and iliac bone is unchanged. This was not significantly hypermetabolic on  PET-CT. There are smaller contralateral calcifications on the right. Multilevel spondylosis noted. IMPRESSION: 1. Interval slight decrease in size of bilateral upper lobe lung masses consistent with response to therapy. These masses are not well defined, in part due to increasing atelectasis and bilateral pleural effusions. 2. No evidence of progressive thoracic disease. No new pulmonary nodules identified. 3. No evidence of metastatic disease within the abdomen or pelvis. 4. Probable enhancing mass in the interpolar region of the left kidney, suspicious for primary renal cell carcinoma. 5. Stable partially calcified soft tissue mass within the left buttocks compared with recent prior imaging. This was partially imaged on abdominal CT performed 05/12/2007 and appeared fatty. Etiology for this is uncertain, and potentially this represents chronic fat necrosis although a liposarcoma cannot be excluded. Metastatic lung cancer unlikely. 6. Aortic Atherosclerosis (ICD10-I70.0). Electronically Signed   By: Richardean Sale M.D.   On: 11/08/2020 13:33

## 2020-11-11 ENCOUNTER — Other Ambulatory Visit: Payer: Self-pay | Admitting: Endocrinology

## 2020-11-11 ENCOUNTER — Inpatient Hospital Stay: Payer: Medicare Other

## 2020-11-11 ENCOUNTER — Inpatient Hospital Stay (HOSPITAL_BASED_OUTPATIENT_CLINIC_OR_DEPARTMENT_OTHER): Payer: Medicare Other | Admitting: Physician Assistant

## 2020-11-11 ENCOUNTER — Other Ambulatory Visit: Payer: Self-pay

## 2020-11-11 ENCOUNTER — Telehealth: Payer: Self-pay | Admitting: Medical Oncology

## 2020-11-11 VITALS — BP 154/65 | HR 77 | Temp 97.0°F | Resp 19 | Ht 67.0 in | Wt 248.6 lb

## 2020-11-11 DIAGNOSIS — Z79899 Other long term (current) drug therapy: Secondary | ICD-10-CM | POA: Diagnosis not present

## 2020-11-11 DIAGNOSIS — T451X5A Adverse effect of antineoplastic and immunosuppressive drugs, initial encounter: Secondary | ICD-10-CM

## 2020-11-11 DIAGNOSIS — C3491 Malignant neoplasm of unspecified part of right bronchus or lung: Secondary | ICD-10-CM

## 2020-11-11 DIAGNOSIS — C3431 Malignant neoplasm of lower lobe, right bronchus or lung: Secondary | ICD-10-CM | POA: Diagnosis not present

## 2020-11-11 DIAGNOSIS — Z5112 Encounter for antineoplastic immunotherapy: Secondary | ICD-10-CM | POA: Diagnosis not present

## 2020-11-11 DIAGNOSIS — C781 Secondary malignant neoplasm of mediastinum: Secondary | ICD-10-CM | POA: Diagnosis not present

## 2020-11-11 DIAGNOSIS — Z5111 Encounter for antineoplastic chemotherapy: Secondary | ICD-10-CM | POA: Diagnosis not present

## 2020-11-11 DIAGNOSIS — C3412 Malignant neoplasm of upper lobe, left bronchus or lung: Secondary | ICD-10-CM | POA: Diagnosis not present

## 2020-11-11 DIAGNOSIS — D6481 Anemia due to antineoplastic chemotherapy: Secondary | ICD-10-CM

## 2020-11-11 LAB — CBC WITH DIFFERENTIAL (CANCER CENTER ONLY)
Abs Immature Granulocytes: 0 10*3/uL (ref 0.00–0.07)
Basophils Absolute: 0 10*3/uL (ref 0.0–0.1)
Basophils Relative: 1 %
Eosinophils Absolute: 0 10*3/uL (ref 0.0–0.5)
Eosinophils Relative: 0 %
HCT: 29.4 % — ABNORMAL LOW (ref 39.0–52.0)
Hemoglobin: 9.3 g/dL — ABNORMAL LOW (ref 13.0–17.0)
Immature Granulocytes: 0 %
Lymphocytes Relative: 24 %
Lymphs Abs: 0.8 10*3/uL (ref 0.7–4.0)
MCH: 31 pg (ref 26.0–34.0)
MCHC: 31.6 g/dL (ref 30.0–36.0)
MCV: 98 fL (ref 80.0–100.0)
Monocytes Absolute: 0.6 10*3/uL (ref 0.1–1.0)
Monocytes Relative: 18 %
Neutro Abs: 2 10*3/uL (ref 1.7–7.7)
Neutrophils Relative %: 57 %
Platelet Count: 86 10*3/uL — ABNORMAL LOW (ref 150–400)
RBC: 3 MIL/uL — ABNORMAL LOW (ref 4.22–5.81)
RDW: 21 % — ABNORMAL HIGH (ref 11.5–15.5)
WBC Count: 3.4 10*3/uL — ABNORMAL LOW (ref 4.0–10.5)
nRBC: 0 % (ref 0.0–0.2)

## 2020-11-11 LAB — TSH: TSH: 4.01 u[IU]/mL (ref 0.320–4.118)

## 2020-11-11 LAB — CMP (CANCER CENTER ONLY)
ALT: 8 U/L (ref 0–44)
AST: 13 U/L — ABNORMAL LOW (ref 15–41)
Albumin: 3.1 g/dL — ABNORMAL LOW (ref 3.5–5.0)
Alkaline Phosphatase: 94 U/L (ref 38–126)
Anion gap: 14 (ref 5–15)
BUN: 23 mg/dL (ref 8–23)
CO2: 21 mmol/L — ABNORMAL LOW (ref 22–32)
Calcium: 9.2 mg/dL (ref 8.9–10.3)
Chloride: 108 mmol/L (ref 98–111)
Creatinine: 1.66 mg/dL — ABNORMAL HIGH (ref 0.61–1.24)
GFR, Estimated: 42 mL/min — ABNORMAL LOW (ref >60)
Glucose, Bld: 71 mg/dL (ref 70–99)
Potassium: 4.2 mmol/L (ref 3.5–5.1)
Sodium: 143 mmol/L (ref 135–145)
Total Bilirubin: 0.6 mg/dL (ref 0.3–1.2)
Total Protein: 6.3 g/dL — ABNORMAL LOW (ref 6.5–8.1)

## 2020-11-11 LAB — SAMPLE TO BLOOD BANK

## 2020-11-11 MED ORDER — SODIUM CHLORIDE 0.9% FLUSH
10.0000 mL | INTRAVENOUS | Status: DC | PRN
  Administered 2020-11-11: 10 mL
  Filled 2020-11-11: qty 10

## 2020-11-11 MED ORDER — HEPARIN SOD (PORK) LOCK FLUSH 100 UNIT/ML IV SOLN
500.0000 [IU] | Freq: Once | INTRAVENOUS | Status: AC | PRN
  Administered 2020-11-11: 500 [IU]
  Filled 2020-11-11: qty 5

## 2020-11-11 MED ORDER — SODIUM CHLORIDE 0.9 % IV SOLN
Freq: Once | INTRAVENOUS | Status: AC
  Filled 2020-11-11: qty 250

## 2020-11-11 MED ORDER — SODIUM CHLORIDE 0.9 % IV SOLN
200.0000 mg | Freq: Once | INTRAVENOUS | Status: AC
  Administered 2020-11-11: 200 mg via INTRAVENOUS
  Filled 2020-11-11: qty 8

## 2020-11-11 NOTE — Patient Instructions (Signed)
[facility] Discharge Instructions for Patients Receiving Chemotherapy  Today you received the following chemotherapy agents:  Keytruda.  To help prevent nausea and vomiting after your treatment, we encourage you to take your nausea medication as directed.   If you develop nausea and vomiting that is not controlled by your nausea medication, call the clinic.   BELOW ARE SYMPTOMS THAT SHOULD BE REPORTED IMMEDIATELY:  *FEVER GREATER THAN 100.5 F  *CHILLS WITH OR WITHOUT FEVER  NAUSEA AND VOMITING THAT IS NOT CONTROLLED WITH YOUR NAUSEA MEDICATION  *UNUSUAL SHORTNESS OF BREATH  *UNUSUAL BRUISING OR BLEEDING  TENDERNESS IN MOUTH AND THROAT WITH OR WITHOUT PRESENCE OF ULCERS  *URINARY PROBLEMS  *BOWEL PROBLEMS  UNUSUAL RASH Items with * indicate a potential emergency and should be followed up as soon as possible.  Feel free to call the clinic should you have any questions or concerns. The clinic phone number is [phone].  Please show the CHEMO ALERT CARD at check-in to the Emergency Department and triage nurse.    

## 2020-11-11 NOTE — Progress Notes (Signed)
Ok to treat with plts 86 and SCr 1.66 per Cassie H, PA   Pt stable on discharge, d/c in wheelchair to lobby.

## 2020-11-11 NOTE — Telephone Encounter (Signed)
Notes faxed with oxygen sats and order for 2 liters [place].

## 2020-11-12 NOTE — Progress Notes (Signed)
Office Visit Note  Patient: Randall Wilkins             Date of Birth: 1941/09/02           MRN: [id number]             PCP: [name], MD Referring: [name], MD Visit Date: 11/26/2020 Occupation: @GUAROCC @  Subjective:  Other (Patient reports he received epidural steroid injections in the past. Patient is requesting an order for one. )   History of Present Illness: Randall Wilkins is a 79 y.o. male with history of gout and degenerative disc disease.  He has been having a lot of pain and discomfort in his lower back.  He denies any radiculopathy.  He states the pain is mostly in the middle and spreads to both sides.  He has not had any gout flare.  He was diagnosed with stage IV adenocarcinoma of the lung in September 2021.  He has been undergoing chemotherapy.  He is under care of Dr. Julien Nordmann.  He would like a referral for lumbar spine injections.  Activities of Daily Living:  Patient reports morning stiffness for 0 minutes.   Patient Denies nocturnal pain.  Difficulty dressing/grooming: Reports Difficulty climbing stairs: Reports Difficulty getting out of chair: Reports Difficulty using hands for taps, buttons, cutlery, and/or writing: Reports  Review of Systems  Constitutional: Positive for fatigue.  HENT: Positive for mouth dryness. Negative for mouth sores and nose dryness.   Eyes: Negative for pain, itching and dryness.  Respiratory: Positive for shortness of breath and difficulty breathing.   Cardiovascular: Negative for chest pain and palpitations.  Gastrointestinal: Negative for blood in stool, constipation and diarrhea.  Endocrine: Negative for increased urination.  Genitourinary: Negative for difficulty urinating.  Musculoskeletal: Negative for arthralgias, joint pain, joint swelling, myalgias, morning stiffness, muscle tenderness and myalgias.  Skin: Positive for color change. Negative for rash and redness.  Allergic/Immunologic: Positive for susceptible  to infections.  Neurological: Positive for numbness, memory loss and weakness. Negative for dizziness and headaches.  Hematological: Positive for bruising/bleeding tendency.  Psychiatric/Behavioral: Positive for confusion.    PMFS History:  Patient Active Problem List   Diagnosis Date Noted  . ARF (acute renal failure) ([place]) 10/02/2020  . Adenocarcinoma of right lung, stage 4 ([place]) 09/02/2020  . Encounter for antineoplastic immunotherapy 09/02/2020  . Goals of care, counseling/discussion 08/20/2020  . Encounter for antineoplastic chemotherapy 08/20/2020  . Mediastinal lymphadenopathy 08/06/2020  . Lung mass 06/05/2020  . Essential hypertension 03/23/2018  . Mild cognitive impairment 12/24/2017  . Gait abnormality 12/24/2017  . Murmur, cardiac 03/04/2017  . History of stroke 12/12/2015  . Gait disorder 12/12/2015  . Coronary artery disease involving native coronary artery of native heart without angina pectoris   . S/P CABG x 2 03/29/2015  . Aortic valve sclerosis 03/29/2015  . Peptic ulcer with hemorrhage 03/28/2015  . Left main coronary artery disease 03/28/2015  . Chronic diastolic congestive heart failure ([place])   . Chronic kidney disease (CKD), stage III (moderate) (HCC)   . Dysphagia   . AKI (acute kidney injury) ([place])   . Acute respiratory failure with hypoxia ([place]) 03/25/2015  . NSTEMI (non-ST elevated myocardial infarction) ([place]) 03/25/2015  . Shortness of breath   . UTI (lower urinary tract infection)   . Subendocardial MI subsequent episode care ([place]) 03/24/2015  . SOB (shortness of breath)   . Confusion 03/21/2015  . Weakness generalized 03/21/2015  . Increased urinary frequency 03/21/2015  . Hyperglycemia  03/21/2015  . Thrombocytopenia ([place]) 03/21/2015  . UTI (urinary tract infection) 03/21/2015  . Fall 03/21/2015  . Hematoma of abdominal wall 03/21/2015  . Diarrhea 03/21/2015  . Obstructive sleep apnea 03/21/2015  . Acute renal failure ([place]) 03/21/2015  .  Mild diastolic dysfunction 26/83/4196  . Dyspnea on exertion 03/21/2015  . Neuropathy 09/11/2014  . Bilateral leg edema 05/21/2014  . Diabetic peripheral neuropathy associated with type 1 diabetes mellitus ([place]) 11/14/2013  . Type 1 diabetes mellitus with nephropathy ([place]) 11/14/2013  . Aortic valve stenosis 11/14/2013  . Obesity (BMI 30-39.9) 11/14/2013  . Asymptomatic PVCs 11/14/2013  . Memory impairment 02/04/2013  . Hyperlipidemia 02/04/2013  . Diabetes 1.5, managed as type 1 ([place]) 02/04/2013  . Cerebral infarction ([place]) 02/04/2013  . CAD S/P percutaneous coronary angioplasty 11/14/2004    Past Medical History:  Diagnosis Date  . Aortic stenosis, mild 11/14/2013  . Aortic valve sclerosis 03/29/2015  . Bilateral leg edema 05/21/2014  . CAD (coronary artery disease)   . Chronic diastolic congestive heart failure ([place])   . Chronic kidney disease (CKD), stage III (moderate) (HCC)   . Diabetes 1.5, managed as type 1 ([place]) 02/04/2013  . Diabetic peripheral neuropathy associated with type 1 diabetes mellitus ([place]) 11/14/2013  . Dysphagia   . Dyspnea on exertion 03/21/2015  . Exogenous obesity   . Gout   . Heart attack ([place])   . History of nuclear stress test 07/2011   dipyridamole; fixed inferolateral defect, worse at stress than rest; no reversible ischemia; low risk scan   . Hyperlipidemia   . Hypertension   . Hypothyroidism   . Insulin dependent diabetes mellitus   . Left foot drop   . Left main coronary artery disease 03/28/2015  . Memory loss   . Obesity (BMI 30-39.9) 11/14/2013  . Obstructive sleep apnea 03/21/2015  . OSA on CPAP    uses a cpap  . Peptic ulcer with hemorrhage 03/28/2015  . Peripheral neuropathy   . Pneumonia   . Rhabdomyolysis   . 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 Stroke ([place])    L patietal with small scattered lacunar infarcts  . Thrombocytopenia ([place]) 03/21/2015  . Venous insufficiency   . Weakness generalized  03/21/2015    Family History  Problem Relation Age of Onset  . Heart disease Mother   . Coronary artery disease Father   . Cancer Maternal Grandmother   . Heart Problems Maternal Grandfather   . Diabetes Son        borderline    Past Surgical History:  Procedure Laterality Date  . BACK SURGERY  2002   lumbosacral. 11 back surgeries total  . BRONCHIAL BIOPSY  08/06/2020   Procedure: BRONCHIAL BIOPSIES;  Surgeon: Collene Gobble, MD;  Location: [facility] ENDOSCOPY;  Service: Pulmonary;;  . BRONCHIAL BRUSHINGS  08/06/2020   Procedure: BRONCHIAL BRUSHINGS;  Surgeon: Collene Gobble, MD;  Location: [facility] ENDOSCOPY;  Service: Pulmonary;;  . BRONCHIAL NEEDLE ASPIRATION BIOPSY  08/06/2020   Procedure: BRONCHIAL NEEDLE ASPIRATION BIOPSIES;  Surgeon: Collene Gobble, MD;  Location: [facility] ENDOSCOPY;  Service: Pulmonary;;  . Carotid Doppler  03/2013   bilat bulb/prox ICAs - mild amount of fibrous plaque with no evidence of diameter reduction  . CARPAL TUNNEL RELEASE Bilateral 08/09/2014   Procedure: BILATERAL CARPAL TUNNEL RELEASE;  Surgeon: Daryll Brod, MD;  Location: [facility];  Service: Orthopedics;  Laterality: Bilateral;  ANESTHESIA:  IV REGIONAL BIL FAB  . CHOLECYSTECTOMY    . COLONOSCOPY    . CORONARY ANGIOPLASTY  10/13/1996  . CORONARY ANGIOPLASTY  09/21/1989   emergency PTCA  . CORONARY ANGIOPLASTY  10/13/1996   Multi-Link diagonal & OD stenting (Dr. Marella Chimes)  . CORONARY ANGIOPLASTY  12/03/1997   disease of mid DX-1 ~50% & in mid PLA & PDA (distal lesions) (Dr. Marella Chimes)   . CORONARY ANGIOPLASTY  10/14/1999   progression of disease distal PLA & PDA; progression of disease prox RCA - moderate (Dr. Marella Chimes)   . CORONARY ANGIOPLASTY WITH STENT PLACEMENT  04/04/2004   4.0x71mm non-DES (thrombectomy via AngioJet) to RCA for high grade stenosis (Dr. Marella Chimes)  . CORONARY ARTERY BYPASS GRAFT N/A 03/29/2015   Procedure: CORONARY ARTERY BYPASS GRAFTING TIMES TWO USING LEFT  INTERNAL MAMMARY ARTERY AND RIGHT LEG GREATER SAPHENOUS VEIN HARVESTED ENDOSCOPICALLY.;  Surgeon: Rexene Alberts, MD;  Location: [facility];  Service: Open Heart Surgery;  Laterality: N/A;  . ESOPHAGOGASTRODUODENOSCOPY N/A 03/27/2015   Procedure: ESOPHAGOGASTRODUODENOSCOPY (EGD);  Surgeon: Clarene Essex, MD;  Location: [facility] ENDOSCOPY;  Service: Endoscopy;  Laterality: N/A;  possible dilation  . ESOPHAGOGASTRODUODENOSCOPY N/A 10/05/2020   Procedure: ESOPHAGOGASTRODUODENOSCOPY (EGD);  Surgeon: Arta Silence, MD;  Location: [facility] ENDOSCOPY;  Service: Endoscopy;  Laterality: N/A;  . FINE NEEDLE ASPIRATION  08/06/2020   Procedure: FINE NEEDLE ASPIRATION (FNA) LINEAR;  Surgeon: Collene Gobble, MD;  Location: [facility] ENDOSCOPY;  Service: Pulmonary;;  . IR IMAGING GUIDED PORT INSERTION  10/07/2020  . LEFT HEART CATHETERIZATION WITH CORONARY ANGIOGRAM N/A 03/28/2015   Procedure: LEFT HEART CATHETERIZATION WITH CORONARY ANGIOGRAM;  Surgeon: Peter M Martinique, MD;  Location: [facility] CATH LAB;  Service: Cardiovascular;  Laterality: N/A;  . SINUS ENDO W/FUSION    . TONSILLECTOMY    . TRANSTHORACIC ECHOCARDIOGRAM  08/08/2013   EF 55-60%, mild conc hypertrophy, grade 1 diastolic dysfunction; AV with mild stenosis; LA & RA mildly dilated  . VIDEO BRONCHOSCOPY WITH ENDOBRONCHIAL NAVIGATION N/A 08/06/2020   Procedure: VIDEO BRONCHOSCOPY WITH ENDOBRONCHIAL NAVIGATION;  Surgeon: Collene Gobble, MD;  Location: [facility] ENDOSCOPY;  Service: Pulmonary;  Laterality: N/A;  . VIDEO BRONCHOSCOPY WITH ENDOBRONCHIAL ULTRASOUND N/A 08/06/2020   Procedure: VIDEO BRONCHOSCOPY WITH ENDOBRONCHIAL ULTRASOUND;  Surgeon: Collene Gobble, MD;  Location: [facility] ENDOSCOPY;  Service: Pulmonary;  Laterality: N/A;   Social History   Social History Narrative   Lives with wife.    Immunization History  Administered Date(s) Administered  . Influenza,inj,Quad PF,6+ Mos 11/14/2013  . Influenza-Unspecified 08/29/2018  . PFIZER SARS-COV-2 Vaccination 01/26/2020, 02/26/2020,  08/27/2020     Objective: Vital Signs: BP (!) 149/90 (BP Location: Left Arm, Patient Position: Sitting, Cuff Size: Normal)   Pulse 70   Resp 18   Ht 5\' 7"  (1.702 m)   Wt 242 lb 3.2 oz (109.9 kg)   BMI 37.93 kg/m    Physical Exam Vitals and nursing note reviewed.  Constitutional:      Appearance: He is well-developed and well-nourished.  HENT:     Head: Normocephalic and atraumatic.  Eyes:     Extraocular Movements: EOM normal.     Conjunctiva/sclera: Conjunctivae normal.     Pupils: Pupils are equal, round, and reactive to light.  Cardiovascular:     Rate and Rhythm: Normal rate and regular rhythm.     Heart sounds: Normal heart sounds.  Pulmonary:     Effort: Pulmonary effort is normal.     Breath sounds: Normal breath sounds.  Abdominal:  General: Bowel sounds are normal.     Palpations: Abdomen is soft.  Musculoskeletal:     Cervical back: Normal range of motion and neck supple.  Skin:    General: Skin is warm and dry.     Capillary Refill: Capillary refill takes less than 2 seconds.  Neurological:     Mental Status: He is alert and oriented to person, place, and time.  Psychiatric:        Mood and Affect: Mood and affect normal.        Behavior: Behavior normal.      Musculoskeletal Exam: He has limited range of motion of the cervical spine.  He has painful limited range of motion of the lumbar spine.  He had tenderness in the L4-L5 region.  Shoulder joints, elbow joints, wrist joints with good range of motion.  He had discomfort range of motion of his right shoulder.  He has PIP and DIP thickening.  Hip joints were difficult to assess in the sitting position.  Knee joints with good range of motion.  He had no tenderness over MTPs.  CDAI Exam: CDAI Score: -- Patient Global: --; Provider Global: -- Swollen: --; Tender: -- Joint Exam 11/26/2020   No joint exam has been documented for this visit   There is currently no information documented on the  homunculus. Go to the Rheumatology activity and complete the homunculus joint exam.  Investigation: No additional findings.  Imaging: DG Chest 2 View  Result Date: 11/05/2020 CLINICAL DATA:  History of metastatic lung carcinoma with shortness of breath. EXAM: CHEST - 2 VIEW COMPARISON:  10/02/2020 FINDINGS: Right-sided chest wall port is noted new from the prior exam. Cardiac shadow remains enlarged. There are persistent mass lesions identified bilaterally but decreased in size when compared with the prior exam. Persistent right-sided pleural effusion is noted with lower lobe consolidation. No pneumothorax is noted. Postsurgical changes are seen. IMPRESSION: Reduction in size of bilateral chest masses. Persistent consolidation in the right base is noted with small associated effusion. Electronically Signed   By: Inez Catalina M.D.   On: 11/05/2020 14:53   CT Chest W Contrast  Result Date: 11/08/2020 CLINICAL DATA:  Non-small cell lung cancer diagnosed 2 months ago. Chemotherapy in progress. Restaging. EXAM: CT CHEST, ABDOMEN, AND PELVIS WITH CONTRAST TECHNIQUE: Multidetector CT imaging of the chest, abdomen and pelvis was performed following the standard protocol during bolus administration of intravenous contrast. CONTRAST:  66mL OMNIPAQUE IOHEXOL 300 MG/ML SOLN. Contrast volume reduced due to renal insufficiency with estimated GFR of 41. COMPARISON:  Chest CT 06/21/2020, abdominal CT 09/19/2020 and PET-CT 08/22/2020. FINDINGS: CT CHEST FINDINGS Cardiovascular: Diffuse atherosclerosis of the aorta, great vessels and coronary arteries status post median sternotomy and CABG. There are dense calcifications of the aortic valve. Right IJ Port-A-Cath extends to superior cavoatrial junction. Stable mild cardiomegaly without significant pericardial fluid. Mediastinum/Nodes: Stable 11 mm pretracheal node on image 22/2, not hypermetabolic on prior PET-CT. Stable left infrahilar fullness. No progressive  mediastinal, hilar or axillary lymphadenopathy. Stable 1.6 cm low-density right thyroid nodule on image 4/2, not hypermetabolic on PET-CT and previously evaluated by ultrasound in 2013. The trachea and esophagus demonstrate no significant findings. Lungs/Pleura: Small dependent right-greater-than-left pleural effusions have mildly enlarged. There is no pneumothorax. The large irregular left upper lobe mass which was hypermetabolic on PET-CT appears slightly smaller, now measuring approximately 7.4 x 4.0 cm on image 64/6 (previously up to 8.1 cm). Cavitary mass posteriorly in the right upper lobe also appears  slightly smaller, measuring approximately 5.1 x 3.5 cm on image 53/6 (previously 5.7 x 5.7 cm). There is progressive volume loss, parenchymal opacity and bronchiectasis in the right lower lobe. Underlying mild centrilobular emphysema noted. No new nodules identified within the aerated portions of the lungs. Musculoskeletal/Chest wall: No chest wall mass or suspicious osseous findings. CT ABDOMEN AND PELVIS FINDINGS Hepatobiliary: The liver is normal in density without suspicious focal abnormality. Mild periportal edema. No significant biliary dilatation post cholecystectomy. Pancreas: Unremarkable. No pancreatic ductal dilatation or surrounding inflammatory changes. Spleen: Normal in size without focal abnormality. Adrenals/Urinary Tract: Both adrenal glands appear normal. There is suspicion of an enhancing mass in the interpolar region of the left kidney, most obvious on the coronal images. This measures approximately 2.0 cm on image 74/2 and is relatively hypodense on the delayed post-contrast images. Stable 4.8 cm cyst in the lower pole of the right kidney. There are additional subcentimeter low-density renal lesions bilaterally which are likely all cysts. No evidence of urinary tract calculus or hydronephrosis. The bladder appears unremarkable. Stomach/Bowel: The stomach is incompletely distended, likely  accounting for diffuse wall thickening. The previously demonstrated wall thickening and inflammation surrounding the proximal duodenum have resolved. No other significant bowel wall thickening, distention or surrounding inflammation. The appendix appears normal. A moderate amount of stool is present throughout the colon. Vascular/Lymphatic: There are no enlarged abdominal or pelvic lymph nodes. Aortic and branch vessel atherosclerosis without acute vascular findings. The portal, superior mesenteric and splenic veins are patent. Reproductive: Stable mild enlargement of the prostate gland. Other: No ascites or peritoneal nodularity. Musculoskeletal: At large partially calcified soft tissue mass within the subcutaneous fat and gluteus maximus muscle posterior to the left sacrum and iliac bone is unchanged. This was not significantly hypermetabolic on PET-CT. There are smaller contralateral calcifications on the right. Multilevel spondylosis noted. IMPRESSION: 1. Interval slight decrease in size of bilateral upper lobe lung masses consistent with response to therapy. These masses are not well defined, in part due to increasing atelectasis and bilateral pleural effusions. 2. No evidence of progressive thoracic disease. No new pulmonary nodules identified. 3. No evidence of metastatic disease within the abdomen or pelvis. 4. Probable enhancing mass in the interpolar region of the left kidney, suspicious for primary renal cell carcinoma. 5. Stable partially calcified soft tissue mass within the left buttocks compared with recent prior imaging. This was partially imaged on abdominal CT performed 05/12/2007 and appeared fatty. Etiology for this is uncertain, and potentially this represents chronic fat necrosis although a liposarcoma cannot be excluded. Metastatic lung cancer unlikely. 6. Aortic Atherosclerosis (ICD10-I70.0). Electronically Signed   By: Richardean Sale M.D.   On: 11/08/2020 13:33   CT Abdomen Pelvis W  Contrast  Result Date: 11/08/2020 CLINICAL DATA:  Non-small cell lung cancer diagnosed 2 months ago. Chemotherapy in progress. Restaging. EXAM: CT CHEST, ABDOMEN, AND PELVIS WITH CONTRAST TECHNIQUE: Multidetector CT imaging of the chest, abdomen and pelvis was performed following the standard protocol during bolus administration of intravenous contrast. CONTRAST:  50mL OMNIPAQUE IOHEXOL 300 MG/ML SOLN. Contrast volume reduced due to renal insufficiency with estimated GFR of 41. COMPARISON:  Chest CT 06/21/2020, abdominal CT 09/19/2020 and PET-CT 08/22/2020. FINDINGS: CT CHEST FINDINGS Cardiovascular: Diffuse atherosclerosis of the aorta, great vessels and coronary arteries status post median sternotomy and CABG. There are dense calcifications of the aortic valve. Right IJ Port-A-Cath extends to superior cavoatrial junction. Stable mild cardiomegaly without significant pericardial fluid. Mediastinum/Nodes: Stable 11 mm pretracheal node on image 22/2,  not hypermetabolic on prior PET-CT. Stable left infrahilar fullness. No progressive mediastinal, hilar or axillary lymphadenopathy. Stable 1.6 cm low-density right thyroid nodule on image 4/2, not hypermetabolic on PET-CT and previously evaluated by ultrasound in 2013. The trachea and esophagus demonstrate no significant findings. Lungs/Pleura: Small dependent right-greater-than-left pleural effusions have mildly enlarged. There is no pneumothorax. The large irregular left upper lobe mass which was hypermetabolic on PET-CT appears slightly smaller, now measuring approximately 7.4 x 4.0 cm on image 64/6 (previously up to 8.1 cm). Cavitary mass posteriorly in the right upper lobe also appears slightly smaller, measuring approximately 5.1 x 3.5 cm on image 53/6 (previously 5.7 x 5.7 cm). There is progressive volume loss, parenchymal opacity and bronchiectasis in the right lower lobe. Underlying mild centrilobular emphysema noted. No new nodules identified within the  aerated portions of the lungs. Musculoskeletal/Chest wall: No chest wall mass or suspicious osseous findings. CT ABDOMEN AND PELVIS FINDINGS Hepatobiliary: The liver is normal in density without suspicious focal abnormality. Mild periportal edema. No significant biliary dilatation post cholecystectomy. Pancreas: Unremarkable. No pancreatic ductal dilatation or surrounding inflammatory changes. Spleen: Normal in size without focal abnormality. Adrenals/Urinary Tract: Both adrenal glands appear normal. There is suspicion of an enhancing mass in the interpolar region of the left kidney, most obvious on the coronal images. This measures approximately 2.0 cm on image 74/2 and is relatively hypodense on the delayed post-contrast images. Stable 4.8 cm cyst in the lower pole of the right kidney. There are additional subcentimeter low-density renal lesions bilaterally which are likely all cysts. No evidence of urinary tract calculus or hydronephrosis. The bladder appears unremarkable. Stomach/Bowel: The stomach is incompletely distended, likely accounting for diffuse wall thickening. The previously demonstrated wall thickening and inflammation surrounding the proximal duodenum have resolved. No other significant bowel wall thickening, distention or surrounding inflammation. The appendix appears normal. A moderate amount of stool is present throughout the colon. Vascular/Lymphatic: There are no enlarged abdominal or pelvic lymph nodes. Aortic and branch vessel atherosclerosis without acute vascular findings. The portal, superior mesenteric and splenic veins are patent. Reproductive: Stable mild enlargement of the prostate gland. Other: No ascites or peritoneal nodularity. Musculoskeletal: At large partially calcified soft tissue mass within the subcutaneous fat and gluteus maximus muscle posterior to the left sacrum and iliac bone is unchanged. This was not significantly hypermetabolic on PET-CT. There are smaller  contralateral calcifications on the right. Multilevel spondylosis noted. IMPRESSION: 1. Interval slight decrease in size of bilateral upper lobe lung masses consistent with response to therapy. These masses are not well defined, in part due to increasing atelectasis and bilateral pleural effusions. 2. No evidence of progressive thoracic disease. No new pulmonary nodules identified. 3. No evidence of metastatic disease within the abdomen or pelvis. 4. Probable enhancing mass in the interpolar region of the left kidney, suspicious for primary renal cell carcinoma. 5. Stable partially calcified soft tissue mass within the left buttocks compared with recent prior imaging. This was partially imaged on abdominal CT performed 05/12/2007 and appeared fatty. Etiology for this is uncertain, and potentially this represents chronic fat necrosis although a liposarcoma cannot be excluded. Metastatic lung cancer unlikely. 6. Aortic Atherosclerosis (ICD10-I70.0). Electronically Signed   By: Richardean Sale M.D.   On: 11/08/2020 13:33    Recent Labs: Lab Results  Component Value Date   WBC 3.4 (L) 11/11/2020   HGB 9.3 (L) 11/11/2020   PLT 86 (L) 11/11/2020   NA 143 11/11/2020   K 4.2 11/11/2020   CL  108 11/11/2020   CO2 21 (L) 11/11/2020   GLUCOSE 71 11/11/2020   BUN 23 11/11/2020   CREATININE 1.66 (H) 11/11/2020   BILITOT 0.6 11/11/2020   ALKPHOS 94 11/11/2020   AST 13 (L) 11/11/2020   ALT 8 11/11/2020   PROT 6.3 (L) 11/11/2020   ALBUMIN 3.1 (L) 11/11/2020   CALCIUM 9.2 11/11/2020   GFRAA >60 09/16/2020    Speciality Comments: No specialty comments available.  Procedures:  No procedures performed Allergies: Actos [pioglitazone], Metformin and related, and Niaspan [niacin er]   Assessment / Plan:     Visit Diagnoses: Idiopathic chronic gout of multiple sites without tophus - Previous patient of Dr. Charlestine Night and [facility] rheumatology.  He is on allopurinol 100 mg p.o. daily. uric acid: 3.7 on  04/30/2020 -he has not had any gout flare.  Have advised him to get uric acid level whenever he had next labs with hematology.  Plan: Uric acid  Medication management-he has been tolerating medications well.  Primary osteoarthritis of right knee - Severe end-stage osteoarthritis and severe chondromalacia patella.  Knee joint was aspirated by Dr. Marlou Sa in the past.  DDD (degenerative disc disease), cervical - status post fusion by Dr. Ellene Route with limited range of motion.  He has limited range of motion.  DDD (degenerative disc disease), lumbar - Status post fusion by Dr. Ellene Route.  He has been having increased lower back pain.  He requests referral for injections.  I will make a referral to Dr. Ernestina Patches.  A handout on lumbar spine exercises were given.  Essential hypertension-his blood pressure is elevated today.  Have advised him to monitor his blood pressure closely.  Adenocarcinoma of right lung, stage 4 ([place]) - dxd 08/2020 followed by Dr. Julien Nordmann on chemotherapy.  Other medical problems are listed as follows:  Asymptomatic PVCs  Aortic valve sclerosis  Coronary artery disease involving native coronary artery of native heart without angina pectoris  Chronic diastolic congestive heart failure (HCC)  NSTEMI (non-ST elevated myocardial infarction) (HCC)  Neuropathy  Stage 3 chronic kidney disease, unspecified whether stage 3a or 3b CKD ([place])  Peptic ulcer with hemorrhage  Diabetic peripheral neuropathy associated with type 1 diabetes mellitus ([place])  Obstructive sleep apnea  Memory impairment  Thrombocytopenia ([place])  Orders: Orders Placed This Encounter  Procedures  . Uric acid   No orders of the defined types were placed in this encounter.     Follow-Up Instructions: Return in about 6 months (around 05/27/2021) for Osteoarthritis, Gout.   Bo Merino, MD  Note - This record has been created using Editor, commissioning.  Chart creation errors have been sought, but may  not always  have been located. Such creation errors do not reflect on  the standard of medical care.

## 2020-11-18 ENCOUNTER — Inpatient Hospital Stay: Payer: Medicare Other

## 2020-11-22 ENCOUNTER — Telehealth: Payer: Self-pay | Admitting: Medical Oncology

## 2020-11-22 NOTE — Telephone Encounter (Signed)
Oxygen concentrator and portable oxygen system authorized 11/08/20-11/2512024 auth number [id number]   Oxygen regulator not approved.

## 2020-11-25 ENCOUNTER — Other Ambulatory Visit: Payer: Medicare Other

## 2020-11-25 NOTE — Progress Notes (Signed)
[facility] OFFICE PROGRESS NOTE  [name], MD [address] [place] [address]  DIAGNOSIS: Stage IV (T4, N2, M1 a) non-small cell lung cancer, mucinous adenocarcinoma presented with bilateral large masses in the left upper lobe as well as the right lower lobe in addition to other small nodules in the right upper lobe with hilar and mediastinal lymphadenopathy diagnosed in August 2021.  Biomarker Findings Microsatellite status - MS-Stable Tumor Mutational Burden - 4 Muts/Mb Genomic Findings For a complete list of the genes assayed, please refer to the Appendix. KRAS G12C GNAS R201H 7 Disease relevant genes with no reportable alterations: ALK, BRAF, EGFR, ERBB2, MET, RET, ROS1  PDL1 Expression: 20%.  PRIOR THERAPY: None  CURRENT THERAPY: Systemic chemotherapy with carboplatin for AUC of 5, Alimta 500 mg/M2 and Keytruda 200 mg IV every 3 weeks. First dose 09/09/2020. Status post 4cycles.Dose reduced to carboplatin for an AUC of 4 from cycle #3 and Alimta held with cycle #4.Chemotherapy with alimta and carboplatin removed from treatment plan starting from cycle #4 and he will start single agent Keytruda  INTERVAL HISTORY: Randall Wilkins 79 y.o. male returns to the clinic today for a follow-up visit accompanied by his wife. The patient feeling fair today without any concerning complaints except forfalling over the weekend. The patient is currently undergoing physical thearpy. He denies vertigo. He states that when he tries to reach forward he loses his balance, especially if he uses the bathroom and does not bring his walker in. He has assistive devices with a cane/walker. Discussed bedside commodes and the patient's family members have a spare that they may start using.   At his last few appointments, the patient had been endorsing increased DOE for which he recently received supplemental oxygen which has been helpful for him. He uses it when he  is exerting himself such as during PT and when he is showering. The patient tolerated his last cycle of treatment well with single agent [place]. He had less fatigue with the last cycle of treatment. Alimta has been held due to elevated creatinine. Otherwise the patient denies any recent fever, chills, or night sweats. The patient lost a few pounds but states that he has a good appetite, especially at lunch/night. He drinks ensure in the AM. He is followed by member the nutritionist team. Of note, the patient does take lasix. The patient denies any chest pain or hemoptysis. He denies any nausea or vomiting. He denies any significant diarrhea or constipation. He states he occasionally has a loose stool. His last BM this morning was normal formed. He denies any headache. He states he has a harder time reading computer screens and has an appointment with his eye doctor next month. Denies any rashes or skin changes except for easy bruising. The patient is here today for evaluation before starting cycle #5.   MEDICAL HISTORY: Past Medical History:  Diagnosis Date  . Aortic stenosis, mild 11/14/2013  . Aortic valve sclerosis 03/29/2015  . Bilateral leg edema 05/21/2014  . CAD (coronary artery disease)   . Chronic diastolic congestive heart failure ([facility])   . Chronic kidney disease (CKD), stage III (moderate) (HCC)   . Diabetes 1.5, managed as type 1 ([facility]) 02/04/2013  . Diabetic peripheral neuropathy associated with type 1 diabetes mellitus ([facility]) 11/14/2013  . Dysphagia   . Dyspnea on exertion 03/21/2015  . Exogenous obesity   . Gout   . Heart attack ([facility])   . History of nuclear  stress test 07/2011   dipyridamole; fixed inferolateral defect, worse at stress than rest; no reversible ischemia; low risk scan   . Hyperlipidemia   . Hypertension   . Hypothyroidism   . Insulin dependent diabetes mellitus   . Left foot drop   . Left main coronary artery disease 03/28/2015  . Memory loss   . Obesity (BMI 30-39.9)  11/14/2013  . Obstructive sleep apnea 03/21/2015  . OSA on CPAP    uses a cpap  . Peptic ulcer with hemorrhage 03/28/2015  . Peripheral neuropathy   . Pneumonia   . Rhabdomyolysis   . 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 Stroke ([facility])    L patietal with small scattered lacunar infarcts  . Thrombocytopenia ([facility]) 03/21/2015  . Venous insufficiency   . Weakness generalized 03/21/2015    ALLERGIES:  is allergic to actos [pioglitazone], metformin and related, and niaspan [niacin er].  MEDICATIONS:  Current Outpatient Medications  Medication Sig Dispense Refill  . albuterol (VENTOLIN HFA) 108 (90 Base) MCG/ACT inhaler Inhale 2 puffs into the lungs every 6 (six) hours as needed for wheezing or shortness of breath. 8 g 6  . allopurinol (ZYLOPRIM) 100 MG tablet Take 1 tablet (100 mg total) by mouth daily. 30 tablet 6  . amitriptyline (ELAVIL) 50 MG tablet Take 75 mg by mouth at bedtime.     Marland Kitchen apixaban (ELIQUIS) 2.5 MG TABS tablet Take 1 tablet (2.5 mg total) by mouth 2 (two) times daily. 180 tablet 3  . Blood Glucose Monitoring Suppl (CONTOUR NEXT MONITOR) w/Device KIT 1 Device by Does not apply route 3 (three) times daily. Use to check blood sugars 3 times daily. Dx Code E13.9 1 kit 2  . CONTOUR NEXT TEST test strip USE 1 STRIP TO CHECK GLUCOSE 4 TIMES DAILY 300 each 1  . donepezil (ARICEPT) 10 MG tablet TAKE ONE TABLET DAILY.  PLEASE CALL [phone] TO SCHEDULE FOLLOW UP. (Patient taking differently: Take 10 mg by mouth daily.) 90 tablet 0  . feeding supplement, GLUCERNA SHAKE, (GLUCERNA SHAKE) LIQD Take 237 mLs by mouth 3 (three) times daily between meals. 8295 mL 0  . folic acid (FOLVITE) 1 MG tablet Take 1 tablet (1 mg total) by mouth daily. 30 tablet 4  . furosemide (LASIX) 40 MG tablet Take 1 tablet (40 mg total) by mouth in the morning. 30 tablet 6  . insulin degludec (TRESIBA FLEXTOUCH) 200 UNIT/ML FlexTouch Pen Inject 24 Units into the skin at  bedtime. (Patient taking differently: Inject 20 Units into the skin at bedtime. 24 units on Chemo days)    . insulin lispro (HUMALOG) 100 UNIT/ML cartridge Inject 5-15 Units into the skin 3 (three) times daily with meals. Sliding scale.    . Insulin Pen Needle (EASY COMFORT PEN NEEDLES) 33G X 4 MM MISC 1 each by Does not apply route See admin instructions. Use to inject insulin 5 times daily. 150 each 3  . levothyroxine (SYNTHROID, LEVOTHROID) 75 MCG tablet Take 75 mcg by mouth daily before breakfast.   1  . lidocaine (XYLOCAINE) 2 % solution Use as directed 15 mLs in the mouth or throat as needed for mouth pain. 100 mL 0  . lidocaine-prilocaine (EMLA) cream Apply 1 application topically as needed. Apply a teaspoon over port site at least 1 hour prior to lab appt. Do not rub in and cover with plastic wrap. 30 g 0  . lisinopril (ZESTRIL) 10  MG tablet TAKE ONE TABLET DAILY (Patient taking differently: Take 10 mg by mouth daily.) 90 tablet 2  . metFORMIN (GLUCOPHAGE-XR) 750 MG 24 hr tablet TAKE 1 TABLET WITH DINNER 90 tablet 0  . metoprolol succinate (TOPROL-XL) 25 MG 24 hr tablet Take 1 tablet (25 mg total) by mouth daily. 90 tablet 3  . Nutritional Supplements (ENSURE PO) Take by mouth as needed.    . ondansetron (ZOFRAN) 4 MG tablet Take 1 tablet (4 mg total) by mouth every 6 (six) hours as needed for nausea. 20 tablet 0  . OXYGEN Inhale 2 L into the lungs. At bedtime and during the day prn    . pantoprazole (PROTONIX) 40 MG tablet Take 1 tablet (40 mg total) by mouth 2 (two) times daily before a meal. 60 tablet 0  . potassium chloride SA (K-DUR,KLOR-CON) 20 MEQ tablet Take 20 mEq by mouth 2 (two) times daily.     . prochlorperazine (COMPAZINE) 10 MG tablet Take 1 tablet (10 mg total) by mouth every 6 (six) hours as needed for nausea or vomiting. 30 tablet 0  . rosuvastatin (CRESTOR) 20 MG tablet Take 20 mg by mouth daily.    . Tamsulosin HCl (FLOMAX) 0.4 MG CAPS Take 0.4 mg by mouth daily.     .  vitamin B-12 (CYANOCOBALAMIN) 1000 MCG tablet Take 1,000 mcg by mouth daily.     No current facility-administered medications for this visit.    SURGICAL HISTORY:  Past Surgical History:  Procedure Laterality Date  . BACK SURGERY  2002   lumbosacral. 11 back surgeries total  . BRONCHIAL BIOPSY  08/06/2020   Procedure: BRONCHIAL BIOPSIES;  Surgeon: Collene Gobble, MD;  Location: [facility] ENDOSCOPY;  Service: Pulmonary;;  . BRONCHIAL BRUSHINGS  08/06/2020   Procedure: BRONCHIAL BRUSHINGS;  Surgeon: Collene Gobble, MD;  Location: [facility] ENDOSCOPY;  Service: Pulmonary;;  . BRONCHIAL NEEDLE ASPIRATION BIOPSY  08/06/2020   Procedure: BRONCHIAL NEEDLE ASPIRATION BIOPSIES;  Surgeon: Collene Gobble, MD;  Location: [facility] ENDOSCOPY;  Service: Pulmonary;;  . Carotid Doppler  03/2013   bilat bulb/prox ICAs - mild amount of fibrous plaque with no evidence of diameter reduction  . CARPAL TUNNEL RELEASE Bilateral 08/09/2014   Procedure: BILATERAL CARPAL TUNNEL RELEASE;  Surgeon: Daryll Brod, MD;  Location: [facility];  Service: Orthopedics;  Laterality: Bilateral;  ANESTHESIA:  IV REGIONAL BIL FAB  . CHOLECYSTECTOMY    . COLONOSCOPY    . CORONARY ANGIOPLASTY  10/13/1996  . CORONARY ANGIOPLASTY  09/21/1989   emergency PTCA  . CORONARY ANGIOPLASTY  10/13/1996   Multi-Link diagonal & OD stenting (Dr. Marella Chimes)  . CORONARY ANGIOPLASTY  12/03/1997   disease of mid DX-1 ~50% & in mid PLA & PDA (distal lesions) (Dr. Marella Chimes)   . CORONARY ANGIOPLASTY  10/14/1999   progression of disease distal PLA & PDA; progression of disease prox RCA - moderate (Dr. Marella Chimes)   . CORONARY ANGIOPLASTY WITH STENT PLACEMENT  04/04/2004   4.0x40m non-DES (thrombectomy via AngioJet) to RCA for high grade stenosis (Dr. RMarella Chimes  . CORONARY ARTERY BYPASS GRAFT N/A 03/29/2015   Procedure: CORONARY ARTERY BYPASS GRAFTING TIMES TWO USING LEFT INTERNAL MAMMARY ARTERY AND RIGHT LEG GREATER SAPHENOUS VEIN HARVESTED  ENDOSCOPICALLY.;  Surgeon: CRexene Alberts MD;  [facility]  Service: Open Heart Surgery;  Laterality: N/A;  . ESOPHAGOGASTRODUODENOSCOPY N/A 03/27/2015   Procedure: ESOPHAGOGASTRODUODENOSCOPY (EGD);  Surgeon: MClarene Essex MD;  [facility];  Service: Endoscopy;  Laterality:  N/A;  possible dilation  . ESOPHAGOGASTRODUODENOSCOPY N/A 10/05/2020   Procedure: ESOPHAGOGASTRODUODENOSCOPY (EGD);  Surgeon: Arta Silence, MD;  Location: [facility] END[facility];  Service: Endoscopy;  Laterality: N/A;  . FINE NEEDLE ASPIRATION  08/06/2020   Procedure: FINE NEEDLE ASPIRATION (FNA) LINEAR;  Surgeon: Collene Gobble, MD;  Location: [facility] ENDOSCOPY;  Service: Pulmonary;;  . IR IMAGING GUIDED PORT INSERTION  10/07/2020  . LEFT HEART CATHETERIZATION WITH CORONARY ANGIOGRAM N/A 03/28/2015   Procedure: LEFT HEART CATHETERIZATION WITH CORONARY ANGIOGRAM;  Surgeon: Peter M Martinique, MD;  Location: [facility] CATH LAB;  Service: Cardiovascular;  Laterality: N/A;  . SINUS ENDO W/FUSION    . TONSILLECTOMY    . TRANSTHORACIC ECHOCARDIOGRAM  08/08/2013   EF 55-60%, mild conc hypertrophy, grade 1 diastolic dysfunction; AV with mild stenosis; LA & RA mildly dilated  . VIDEO BRONCHOSCOPY WITH ENDOBRONCHIAL NAVIGATION N/A 08/06/2020   Procedure: VIDEO BRONCHOSCOPY WITH ENDOBRONCHIAL NAVIGATION;  Surgeon: Collene Gobble, MD;  Location: [facility] ENDOSCOPY;  Service: Pulmonary;  Laterality: N/A;  . VIDEO BRONCHOSCOPY WITH ENDOBRONCHIAL ULTRASOUND N/A 08/06/2020   Procedure: VIDEO BRONCHOSCOPY WITH ENDOBRONCHIAL ULTRASOUND;  Surgeon: Collene Gobble, MD;  Location: [facility] ENDOSCOPY;  Service: Pulmonary;  Laterality: N/A;    REVIEW OF SYSTEMS:   Review of Systems  Constitutional: Positive for fatigue. Negative for appetite change, chills, fever and unexpected weight change.  HENT: Negative for mouth sores, nosebleeds, sore throat and trouble swallowing.   Eyes: Positive for blurry vision with reading computer screens. Negative for  icterus.   Respiratory: Positive for dyspnea on exertion. negative for cough, hemoptysis, and wheezing.   Cardiovascular: Positive for baseline lower extremity swelling. Negative for chest pain. Gastrointestinal: Negative for abdominal pain, constipation, diarrhea, nausea and vomiting.  Genitourinary: Negative for bladder incontinence, difficulty urinating, dysuria, frequency and hematuria.   Musculoskeletal: Negative for back pain, neck pain and neck stiffness.  Skin: Negative for itching and rash.  Neurological: Positive for off balance. Negative for dizziness, extremity weakness, headaches, light-headedness and seizures.  Hematological: Positive for senile purpura. Negative for adenopathy.  Psychiatric/Behavioral: Negative for confusion, depression and sleep disturbance. The patient is not nervous/anxious.     PHYSICAL EXAMINATION:  Blood pressure 130/62, pulse 64, temperature 98.6 F (37 C), temperature source Tympanic, resp. rate 18, height 5' 7" (1.702 m), weight 239 lb 11.2 oz (108.7 kg), SpO2 96 %.  ECOG PERFORMANCE STATUS: 2 - Symptomatic, <50% confined to bed  Physical Exam  Constitutional: Oriented to person, place, and time and well-developed, well-nourished, and in no distress.  HENT:  Head: Normocephalic and atraumatic.  Mouth/Throat: Oropharynx is clear and moist. No oropharyngeal exudate.  Eyes: Conjunctivae are normal. Right eye exhibits no discharge. Left eye exhibits no discharge. No scleral icterus.  Neck: Normal range of motion. Neck supple.  Cardiovascular: Normal rate, regular rhythm, systolic murmur noted and intact distal pulses.   Pulmonary/Chest: Effort normal. Decreased breath sounds at the base of both lungs. No respiratory distress. No wheezes. No rales.  Abdominal: Soft. Bowel sounds are normal. Exhibits no distension and no mass. There is no tenderness.  Musculoskeletal: Bilateral lower extremity swelling. Normal range of motion.   Lymphadenopathy:    No cervical  adenopathy.  Neurological: Alert and oriented to person, place, and time. Exhibits normal muscle tone. Examined in the wheelchair.  Skin: Skin is warm and dry. No rash noted. Not diaphoretic. No erythema. No pallor.  Psychiatric: Mood, memory and judgment normal.  Vitals reviewed.  LABORATORY DATA: Lab Results  Component Value Date  WBC 5.2 12/02/2020   HGB 10.1 (L) 12/02/2020   HCT 31.8 (L) 12/02/2020   MCV 95.5 12/02/2020   PLT 126 (L) 12/02/2020      Chemistry      Component Value Date/Time   NA 142 12/02/2020 0845   K 4.6 12/02/2020 0845   CL 105 12/02/2020 0845   CO2 29 12/02/2020 0845   BUN 22 12/02/2020 0845   CREATININE 1.56 (H) 12/02/2020 0845   CREATININE 1.30 (H) 04/30/2020 1044      Component Value Date/Time   CALCIUM 9.0 12/02/2020 0845   ALKPHOS 98 12/02/2020 0845   AST 17 12/02/2020 0845   ALT 11 12/02/2020 0845   BILITOT 0.7 12/02/2020 0845       RADIOGRAPHIC STUDIES:  DG Chest 2 View  Result Date: 11/05/2020 CLINICAL DATA:  History of metastatic lung carcinoma with shortness of breath. EXAM: CHEST - 2 VIEW COMPARISON:  10/02/2020 FINDINGS: Right-sided chest wall port is noted new from the prior exam. Cardiac shadow remains enlarged. There are persistent mass lesions identified bilaterally but decreased in size when compared with the prior exam. Persistent right-sided pleural effusion is noted with lower lobe consolidation. No pneumothorax is noted. Postsurgical changes are seen. IMPRESSION: Reduction in size of bilateral chest masses. Persistent consolidation in the right base is noted with small associated effusion. Electronically Signed   By: Inez Catalina M.D.   On: 11/05/2020 14:53   CT Chest W Contrast  Result Date: 11/08/2020 CLINICAL DATA:  Non-small cell lung cancer diagnosed 2 months ago. Chemotherapy in progress. Restaging. EXAM: CT CHEST, ABDOMEN, AND PELVIS WITH CONTRAST TECHNIQUE: Multidetector CT imaging of the chest, abdomen and pelvis  was performed following the standard protocol during bolus administration of intravenous contrast. CONTRAST:  39m OMNIPAQUE IOHEXOL 300 MG/ML SOLN. Contrast volume reduced due to renal insufficiency with estimated GFR of 41. COMPARISON:  Chest CT 06/21/2020, abdominal CT 09/19/2020 and PET-CT 08/22/2020. FINDINGS: CT CHEST FINDINGS Cardiovascular: Diffuse atherosclerosis of the aorta, great vessels and coronary arteries status post median sternotomy and CABG. There are dense calcifications of the aortic valve. Right IJ Port-A-Cath extends to superior cavoatrial junction. Stable mild cardiomegaly without significant pericardial fluid. Mediastinum/Nodes: Stable 11 mm pretracheal node on image 22/2, not hypermetabolic on prior PET-CT. Stable left infrahilar fullness. No progressive mediastinal, hilar or axillary lymphadenopathy. Stable 1.6 cm low-density right thyroid nodule on image 4/2, not hypermetabolic on PET-CT and previously evaluated by ultrasound in 2013. The trachea and esophagus demonstrate no significant findings. Lungs/Pleura: Small dependent right-greater-than-left pleural effusions have mildly enlarged. There is no pneumothorax. The large irregular left upper lobe mass which was hypermetabolic on PET-CT appears slightly smaller, now measuring approximately 7.4 x 4.0 cm on image 64/6 (previously up to 8.1 cm). Cavitary mass posteriorly in the right upper lobe also appears slightly smaller, measuring approximately 5.1 x 3.5 cm on image 53/6 (previously 5.7 x 5.7 cm). There is progressive volume loss, parenchymal opacity and bronchiectasis in the right lower lobe. Underlying mild centrilobular emphysema noted. No new nodules identified within the aerated portions of the lungs. Musculoskeletal/Chest wall: No chest wall mass or suspicious osseous findings. CT ABDOMEN AND PELVIS FINDINGS Hepatobiliary: The liver is normal in density without suspicious focal abnormality. Mild periportal edema. No significant  biliary dilatation post cholecystectomy. Pancreas: Unremarkable. No pancreatic ductal dilatation or surrounding inflammatory changes. Spleen: Normal in size without focal abnormality. Adrenals/Urinary Tract: Both adrenal glands appear normal. There is suspicion of an enhancing mass in the interpolar region of  the left kidney, most obvious on the coronal images. This measures approximately 2.0 cm on image 74/2 and is relatively hypodense on the delayed post-contrast images. Stable 4.8 cm cyst in the lower pole of the right kidney. There are additional subcentimeter low-density renal lesions bilaterally which are likely all cysts. No evidence of urinary tract calculus or hydronephrosis. The bladder appears unremarkable. Stomach/Bowel: The stomach is incompletely distended, likely accounting for diffuse wall thickening. The previously demonstrated wall thickening and inflammation surrounding the proximal duodenum have resolved. No other significant bowel wall thickening, distention or surrounding inflammation. The appendix appears normal. A moderate amount of stool is present throughout the colon. Vascular/Lymphatic: There are no enlarged abdominal or pelvic lymph nodes. Aortic and branch vessel atherosclerosis without acute vascular findings. The portal, superior mesenteric and splenic veins are patent. Reproductive: Stable mild enlargement of the prostate gland. Other: No ascites or peritoneal nodularity. Musculoskeletal: At large partially calcified soft tissue mass within the subcutaneous fat and gluteus maximus muscle posterior to the left sacrum and iliac bone is unchanged. This was not significantly hypermetabolic on PET-CT. There are smaller contralateral calcifications on the right. Multilevel spondylosis noted. IMPRESSION: 1. Interval slight decrease in size of bilateral upper lobe lung masses consistent with response to therapy. These masses are not well defined, in part due to increasing atelectasis and  bilateral pleural effusions. 2. No evidence of progressive thoracic disease. No new pulmonary nodules identified. 3. No evidence of metastatic disease within the abdomen or pelvis. 4. Probable enhancing mass in the interpolar region of the left kidney, suspicious for primary renal cell carcinoma. 5. Stable partially calcified soft tissue mass within the left buttocks compared with recent prior imaging. This was partially imaged on abdominal CT performed 05/12/2007 and appeared fatty. Etiology for this is uncertain, and potentially this represents chronic fat necrosis although a liposarcoma cannot be excluded. Metastatic lung cancer unlikely. 6. Aortic Atherosclerosis (ICD10-I70.0). Electronically Signed   By: Richardean Sale M.D.   On: 11/08/2020 13:33   CT Abdomen Pelvis W Contrast  Result Date: 11/08/2020 CLINICAL DATA:  Non-small cell lung cancer diagnosed 2 months ago. Chemotherapy in progress. Restaging. EXAM: CT CHEST, ABDOMEN, AND PELVIS WITH CONTRAST TECHNIQUE: Multidetector CT imaging of the chest, abdomen and pelvis was performed following the standard protocol during bolus administration of intravenous contrast. CONTRAST:  87m OMNIPAQUE IOHEXOL 300 MG/ML SOLN. Contrast volume reduced due to renal insufficiency with estimated GFR of 41. COMPARISON:  Chest CT 06/21/2020, abdominal CT 09/19/2020 and PET-CT 08/22/2020. FINDINGS: CT CHEST FINDINGS Cardiovascular: Diffuse atherosclerosis of the aorta, great vessels and coronary arteries status post median sternotomy and CABG. There are dense calcifications of the aortic valve. Right IJ Port-A-Cath extends to superior cavoatrial junction. Stable mild cardiomegaly without significant pericardial fluid. Mediastinum/Nodes: Stable 11 mm pretracheal node on image 22/2, not hypermetabolic on prior PET-CT. Stable left infrahilar fullness. No progressive mediastinal, hilar or axillary lymphadenopathy. Stable 1.6 cm low-density right thyroid nodule on image 4/2,  not hypermetabolic on PET-CT and previously evaluated by ultrasound in 2013. The trachea and esophagus demonstrate no significant findings. Lungs/Pleura: Small dependent right-greater-than-left pleural effusions have mildly enlarged. There is no pneumothorax. The large irregular left upper lobe mass which was hypermetabolic on PET-CT appears slightly smaller, now measuring approximately 7.4 x 4.0 cm on image 64/6 (previously up to 8.1 cm). Cavitary mass posteriorly in the right upper lobe also appears slightly smaller, measuring approximately 5.1 x 3.5 cm on image 53/6 (previously 5.7 x 5.7 cm). There is progressive  volume loss, parenchymal opacity and bronchiectasis in the right lower lobe. Underlying mild centrilobular emphysema noted. No new nodules identified within the aerated portions of the lungs. Musculoskeletal/Chest wall: No chest wall mass or suspicious osseous findings. CT ABDOMEN AND PELVIS FINDINGS Hepatobiliary: The liver is normal in density without suspicious focal abnormality. Mild periportal edema. No significant biliary dilatation post cholecystectomy. Pancreas: Unremarkable. No pancreatic ductal dilatation or surrounding inflammatory changes. Spleen: Normal in size without focal abnormality. Adrenals/Urinary Tract: Both adrenal glands appear normal. There is suspicion of an enhancing mass in the interpolar region of the left kidney, most obvious on the coronal images. This measures approximately 2.0 cm on image 74/2 and is relatively hypodense on the delayed post-contrast images. Stable 4.8 cm cyst in the lower pole of the right kidney. There are additional subcentimeter low-density renal lesions bilaterally which are likely all cysts. No evidence of urinary tract calculus or hydronephrosis. The bladder appears unremarkable. Stomach/Bowel: The stomach is incompletely distended, likely accounting for diffuse wall thickening. The previously demonstrated wall thickening and inflammation surrounding  the proximal duodenum have resolved. No other significant bowel wall thickening, distention or surrounding inflammation. The appendix appears normal. A moderate amount of stool is present throughout the colon. Vascular/Lymphatic: There are no enlarged abdominal or pelvic lymph nodes. Aortic and branch vessel atherosclerosis without acute vascular findings. The portal, superior mesenteric and splenic veins are patent. Reproductive: Stable mild enlargement of the prostate gland. Other: No ascites or peritoneal nodularity. Musculoskeletal: At large partially calcified soft tissue mass within the subcutaneous fat and gluteus maximus muscle posterior to the left sacrum and iliac bone is unchanged. This was not significantly hypermetabolic on PET-CT. There are smaller contralateral calcifications on the right. Multilevel spondylosis noted. IMPRESSION: 1. Interval slight decrease in size of bilateral upper lobe lung masses consistent with response to therapy. These masses are not well defined, in part due to increasing atelectasis and bilateral pleural effusions. 2. No evidence of progressive thoracic disease. No new pulmonary nodules identified. 3. No evidence of metastatic disease within the abdomen or pelvis. 4. Probable enhancing mass in the interpolar region of the left kidney, suspicious for primary renal cell carcinoma. 5. Stable partially calcified soft tissue mass within the left buttocks compared with recent prior imaging. This was partially imaged on abdominal CT performed 05/12/2007 and appeared fatty. Etiology for this is uncertain, and potentially this represents chronic fat necrosis although a liposarcoma cannot be excluded. Metastatic lung cancer unlikely. 6. Aortic Atherosclerosis (ICD10-I70.0). Electronically Signed   By: Richardean Sale M.D.   On: 11/08/2020 13:33     ASSESSMENT/PLAN:  This is a very pleasant 79 year old Caucasian male diagnosed with stage IV (T4, N2, M1A) non-small cell lung  cancer, mucinous adenocarcinoma. The patient presented with bilateral large masses in the left upper lobe and the right upper lobe as well as other pulmonary nodules in the right upper lobe with hilar and mediastinal lymphadenopathy. The patient was diagnosed in August 2021. The patient's PD-L1 expression is 20%. The patient's molecular studies show that he is positive for K-ras G12 C mutation which may be a targetable mutation in the second line setting in the future.  The patient is currently undergoing palliative systemic chemotherapy with carboplatin for an AUC of 5, Alimta 500 mg per metered squared, Keytruda 200 mg IV every 3 weeks. He is status post 4 cycles. Starting from cycle #3, Alimta was held due to elevated creatinine. The patient's carboplatin was also reduced to an AUC of 4. Starting  from cycle 4, the patient has been on maintenance single agent immunotherapy with Keytruda.    Labs were reviewed. Recommend that he proceed with cycle #5 today as scheduled.   We will see him back for a follow up visit in 3 weeks for evaluation before starting cycle #6.   Regarding his frequent falls, the patient is undergoing physical therapy. I asked the patient if they require any extra assistive devices. They already have a cane, walker, bedside commode, supplemental oxygen, as well as handles/grab bars in their house. Advised the patient to continue physical therapy as well as be cautious not to move to quickly and risk falling.   The patient was advised to call immediately if he has any concerning symptoms in the interval. The patient voices understanding of current disease status and treatment options and is in agreement with the current care plan. All questions were answered. The patient knows to call the clinic with any problems, questions or concerns. We can certainly see the patient much sooner if necessary         No orders of the defined types were placed in this encounter.      L , PA-C 12/02/20

## 2020-11-26 ENCOUNTER — Other Ambulatory Visit: Payer: Self-pay | Admitting: *Deleted

## 2020-11-26 ENCOUNTER — Other Ambulatory Visit: Payer: Self-pay

## 2020-11-26 ENCOUNTER — Ambulatory Visit (INDEPENDENT_AMBULATORY_CARE_PROVIDER_SITE_OTHER): Payer: Medicare Other | Admitting: Rheumatology

## 2020-11-26 ENCOUNTER — Encounter: Payer: Self-pay | Admitting: Rheumatology

## 2020-11-26 VITALS — BP 149/90 | HR 70 | Resp 18 | Ht 67.0 in | Wt 242.2 lb

## 2020-11-26 DIAGNOSIS — M1A09X0 Idiopathic chronic gout, multiple sites, without tophus (tophi): Secondary | ICD-10-CM | POA: Diagnosis not present

## 2020-11-26 DIAGNOSIS — R413 Other amnesia: Secondary | ICD-10-CM

## 2020-11-26 DIAGNOSIS — K274 Chronic or unspecified peptic ulcer, site unspecified, with hemorrhage: Secondary | ICD-10-CM

## 2020-11-26 DIAGNOSIS — M5030 Other cervical disc degeneration, unspecified cervical region: Secondary | ICD-10-CM

## 2020-11-26 DIAGNOSIS — I214 Non-ST elevation (NSTEMI) myocardial infarction: Secondary | ICD-10-CM

## 2020-11-26 DIAGNOSIS — I358 Other nonrheumatic aortic valve disorders: Secondary | ICD-10-CM

## 2020-11-26 DIAGNOSIS — M5136 Other intervertebral disc degeneration, lumbar region: Secondary | ICD-10-CM

## 2020-11-26 DIAGNOSIS — N183 Chronic kidney disease, stage 3 unspecified: Secondary | ICD-10-CM

## 2020-11-26 DIAGNOSIS — Z79899 Other long term (current) drug therapy: Secondary | ICD-10-CM

## 2020-11-26 DIAGNOSIS — I493 Ventricular premature depolarization: Secondary | ICD-10-CM

## 2020-11-26 DIAGNOSIS — M1711 Unilateral primary osteoarthritis, right knee: Secondary | ICD-10-CM

## 2020-11-26 DIAGNOSIS — I2510 Atherosclerotic heart disease of native coronary artery without angina pectoris: Secondary | ICD-10-CM

## 2020-11-26 DIAGNOSIS — C3491 Malignant neoplasm of unspecified part of right bronchus or lung: Secondary | ICD-10-CM

## 2020-11-26 DIAGNOSIS — M51369 Other intervertebral disc degeneration, lumbar region without mention of lumbar back pain or lower extremity pain: Secondary | ICD-10-CM

## 2020-11-26 DIAGNOSIS — G629 Polyneuropathy, unspecified: Secondary | ICD-10-CM

## 2020-11-26 DIAGNOSIS — I10 Essential (primary) hypertension: Secondary | ICD-10-CM

## 2020-11-26 DIAGNOSIS — E1042 Type 1 diabetes mellitus with diabetic polyneuropathy: Secondary | ICD-10-CM

## 2020-11-26 DIAGNOSIS — I5032 Chronic diastolic (congestive) heart failure: Secondary | ICD-10-CM

## 2020-11-26 DIAGNOSIS — G4733 Obstructive sleep apnea (adult) (pediatric): Secondary | ICD-10-CM

## 2020-11-26 DIAGNOSIS — D696 Thrombocytopenia, unspecified: Secondary | ICD-10-CM

## 2020-11-26 NOTE — Patient Instructions (Signed)

## 2020-11-28 ENCOUNTER — Telehealth: Payer: Self-pay

## 2020-11-28 NOTE — Telephone Encounter (Signed)
Jerilyn with [facility] called to give report that pt fell 11/26/20. Pt has a small skin tear on his right lower leg, tenderness with deep breathing on his lower right side. Pt is also on 2L of O2 at home and his saturation decreases to 86% with exertion.  She is requesting to extend home health with PT for 60 days. I advised her to fax the request for review by Dr. Julien Nordmann. She voiced understanding of this.

## 2020-12-02 ENCOUNTER — Other Ambulatory Visit: Payer: Self-pay

## 2020-12-02 ENCOUNTER — Encounter: Payer: Self-pay | Admitting: Physician Assistant

## 2020-12-02 ENCOUNTER — Inpatient Hospital Stay: Payer: Medicare Other

## 2020-12-02 ENCOUNTER — Other Ambulatory Visit: Payer: Self-pay | Admitting: Rheumatology

## 2020-12-02 ENCOUNTER — Inpatient Hospital Stay: Payer: Medicare Other | Attending: Internal Medicine | Admitting: Physician Assistant

## 2020-12-02 VITALS — BP 130/62 | HR 64 | Temp 98.6°F | Resp 18 | Ht 67.0 in | Wt 239.7 lb

## 2020-12-02 DIAGNOSIS — C3411 Malignant neoplasm of upper lobe, right bronchus or lung: Secondary | ICD-10-CM | POA: Diagnosis not present

## 2020-12-02 DIAGNOSIS — C3412 Malignant neoplasm of upper lobe, left bronchus or lung: Secondary | ICD-10-CM | POA: Insufficient documentation

## 2020-12-02 DIAGNOSIS — Z5112 Encounter for antineoplastic immunotherapy: Secondary | ICD-10-CM

## 2020-12-02 DIAGNOSIS — M1A09X0 Idiopathic chronic gout, multiple sites, without tophus (tophi): Secondary | ICD-10-CM | POA: Diagnosis not present

## 2020-12-02 DIAGNOSIS — T451X5A Adverse effect of antineoplastic and immunosuppressive drugs, initial encounter: Secondary | ICD-10-CM

## 2020-12-02 DIAGNOSIS — C3491 Malignant neoplasm of unspecified part of right bronchus or lung: Secondary | ICD-10-CM

## 2020-12-02 DIAGNOSIS — D6481 Anemia due to antineoplastic chemotherapy: Secondary | ICD-10-CM

## 2020-12-02 DIAGNOSIS — Z79899 Other long term (current) drug therapy: Secondary | ICD-10-CM | POA: Diagnosis not present

## 2020-12-02 LAB — CMP (CANCER CENTER ONLY)
ALT: 11 U/L (ref 0–44)
AST: 17 U/L (ref 15–41)
Albumin: 3.2 g/dL — ABNORMAL LOW (ref 3.5–5.0)
Alkaline Phosphatase: 98 U/L (ref 38–126)
Anion gap: 8 (ref 5–15)
BUN: 22 mg/dL (ref 8–23)
CO2: 29 mmol/L (ref 22–32)
Calcium: 9 mg/dL (ref 8.9–10.3)
Chloride: 105 mmol/L (ref 98–111)
Creatinine: 1.56 mg/dL — ABNORMAL HIGH (ref 0.61–1.24)
GFR, Estimated: 45 mL/min — ABNORMAL LOW (ref >60)
Glucose, Bld: 176 mg/dL — ABNORMAL HIGH (ref 70–99)
Potassium: 4.6 mmol/L (ref 3.5–5.1)
Sodium: 142 mmol/L (ref 135–145)
Total Bilirubin: 0.7 mg/dL (ref 0.3–1.2)
Total Protein: 6.7 g/dL (ref 6.5–8.1)

## 2020-12-02 LAB — CBC WITH DIFFERENTIAL (CANCER CENTER ONLY)
Abs Immature Granulocytes: 0.01 10*3/uL (ref 0.00–0.07)
Basophils Absolute: 0.1 10*3/uL (ref 0.0–0.1)
Basophils Relative: 1 %
Eosinophils Absolute: 0 10*3/uL (ref 0.0–0.5)
Eosinophils Relative: 1 %
HCT: 31.8 % — ABNORMAL LOW (ref 39.0–52.0)
Hemoglobin: 10.1 g/dL — ABNORMAL LOW (ref 13.0–17.0)
Immature Granulocytes: 0 %
Lymphocytes Relative: 18 %
Lymphs Abs: 1 10*3/uL (ref 0.7–4.0)
MCH: 30.3 pg (ref 26.0–34.0)
MCHC: 31.8 g/dL (ref 30.0–36.0)
MCV: 95.5 fL (ref 80.0–100.0)
Monocytes Absolute: 0.6 10*3/uL (ref 0.1–1.0)
Monocytes Relative: 11 %
Neutro Abs: 3.6 10*3/uL (ref 1.7–7.7)
Neutrophils Relative %: 69 %
Platelet Count: 126 10*3/uL — ABNORMAL LOW (ref 150–400)
RBC: 3.33 MIL/uL — ABNORMAL LOW (ref 4.22–5.81)
RDW: 17.4 % — ABNORMAL HIGH (ref 11.5–15.5)
WBC Count: 5.2 10*3/uL (ref 4.0–10.5)
nRBC: 0 % (ref 0.0–0.2)

## 2020-12-02 LAB — SAMPLE TO BLOOD BANK

## 2020-12-02 LAB — TSH: TSH: 3.288 u[IU]/mL (ref 0.320–4.118)

## 2020-12-02 MED ORDER — SODIUM CHLORIDE 0.9 % IV SOLN
200.0000 mg | Freq: Once | INTRAVENOUS | Status: AC
  Administered 2020-12-02: 200 mg via INTRAVENOUS
  Filled 2020-12-02: qty 8

## 2020-12-02 MED ORDER — SODIUM CHLORIDE 0.9% FLUSH
10.0000 mL | INTRAVENOUS | Status: DC | PRN
Start: 2020-12-02 — End: 2020-12-02
  Administered 2020-12-02: 10 mL
  Filled 2020-12-02: qty 10

## 2020-12-02 MED ORDER — SODIUM CHLORIDE 0.9 % IV SOLN
Freq: Once | INTRAVENOUS | Status: AC
  Filled 2020-12-02: qty 250

## 2020-12-02 MED ORDER — HEPARIN SOD (PORK) LOCK FLUSH 100 UNIT/ML IV SOLN
500.0000 [IU] | Freq: Once | INTRAVENOUS | Status: AC | PRN
  Administered 2020-12-02: 500 [IU]
  Filled 2020-12-02: qty 5

## 2020-12-02 NOTE — Patient Instructions (Signed)
[facility] Discharge Instructions for Patients Receiving Chemotherapy  Today you received the following chemotherapy agents:  Keytruda.  To help prevent nausea and vomiting after your treatment, we encourage you to take your nausea medication as directed.   If you develop nausea and vomiting that is not controlled by your nausea medication, call the clinic.   BELOW ARE SYMPTOMS THAT SHOULD BE REPORTED IMMEDIATELY:  *FEVER GREATER THAN 100.5 F  *CHILLS WITH OR WITHOUT FEVER  NAUSEA AND VOMITING THAT IS NOT CONTROLLED WITH YOUR NAUSEA MEDICATION  *UNUSUAL SHORTNESS OF BREATH  *UNUSUAL BRUISING OR BLEEDING  TENDERNESS IN MOUTH AND THROAT WITH OR WITHOUT PRESENCE OF ULCERS  *URINARY PROBLEMS  *BOWEL PROBLEMS  UNUSUAL RASH Items with * indicate a potential emergency and should be followed up as soon as possible.  Feel free to call the clinic should you have any questions or concerns. The clinic phone number is [phone].  Please show the CHEMO ALERT CARD at check-in to the Emergency Department and triage nurse.    

## 2020-12-02 NOTE — Progress Notes (Signed)
Per Cassie Heilingoetter, PA, patient is okay to treat with Scr 1.56.

## 2020-12-03 ENCOUNTER — Telehealth: Payer: Self-pay | Admitting: Physician Assistant

## 2020-12-03 ENCOUNTER — Telehealth: Payer: Self-pay | Admitting: Medical Oncology

## 2020-12-03 LAB — URIC ACID: Uric Acid, Serum: 7 mg/dL (ref 4.0–8.0)

## 2020-12-03 LAB — CLIENT EDUCATION TRACKING

## 2020-12-03 NOTE — Telephone Encounter (Signed)
Per Dr. Julien Nordmann ,I told Erlene Quan , OT it is okay to treat pt for 1 week x 6 weeks.

## 2020-12-03 NOTE — Progress Notes (Signed)
We will continue to monitor.

## 2020-12-03 NOTE — Progress Notes (Signed)
Uric acid is elevated.  I question the compliance with taking allopurinol.  His uric acid always has been very well controlled in the past on the same dose.  Please remind patient to take allopurinol on a regular basis.

## 2020-12-03 NOTE — Telephone Encounter (Signed)
Scheduled appts per 12/20 los. Pt to get updated appt calendar at next visit per appt notes.  

## 2020-12-03 NOTE — Progress Notes (Signed)
I would recommend patient to follow the recommendations of the oncologist.  The oncologist must have reduced the dose on purpose.

## 2020-12-08 DIAGNOSIS — M109 Gout, unspecified: Secondary | ICD-10-CM | POA: Diagnosis not present

## 2020-12-08 DIAGNOSIS — K2091 Esophagitis, unspecified with bleeding: Secondary | ICD-10-CM | POA: Diagnosis not present

## 2020-12-08 DIAGNOSIS — I2510 Atherosclerotic heart disease of native coronary artery without angina pectoris: Secondary | ICD-10-CM | POA: Diagnosis not present

## 2020-12-08 DIAGNOSIS — E1322 Other specified diabetes mellitus with diabetic chronic kidney disease: Secondary | ICD-10-CM | POA: Diagnosis not present

## 2020-12-08 DIAGNOSIS — C3491 Malignant neoplasm of unspecified part of right bronchus or lung: Secondary | ICD-10-CM | POA: Diagnosis not present

## 2020-12-08 DIAGNOSIS — E1351 Other specified diabetes mellitus with diabetic peripheral angiopathy without gangrene: Secondary | ICD-10-CM | POA: Diagnosis not present

## 2020-12-08 DIAGNOSIS — I480 Paroxysmal atrial fibrillation: Secondary | ICD-10-CM | POA: Diagnosis not present

## 2020-12-08 DIAGNOSIS — E1342 Other specified diabetes mellitus with diabetic polyneuropathy: Secondary | ICD-10-CM | POA: Diagnosis not present

## 2020-12-08 DIAGNOSIS — N1831 Chronic kidney disease, stage 3a: Secondary | ICD-10-CM | POA: Diagnosis not present

## 2020-12-08 DIAGNOSIS — I130 Hypertensive heart and chronic kidney disease with heart failure and stage 1 through stage 4 chronic kidney disease, or unspecified chronic kidney disease: Secondary | ICD-10-CM | POA: Diagnosis not present

## 2020-12-08 DIAGNOSIS — D62 Acute posthemorrhagic anemia: Secondary | ICD-10-CM | POA: Diagnosis not present

## 2020-12-08 DIAGNOSIS — G4733 Obstructive sleep apnea (adult) (pediatric): Secondary | ICD-10-CM | POA: Diagnosis not present

## 2020-12-08 DIAGNOSIS — E785 Hyperlipidemia, unspecified: Secondary | ICD-10-CM | POA: Diagnosis not present

## 2020-12-08 DIAGNOSIS — I872 Venous insufficiency (chronic) (peripheral): Secondary | ICD-10-CM | POA: Diagnosis not present

## 2020-12-08 DIAGNOSIS — E039 Hypothyroidism, unspecified: Secondary | ICD-10-CM | POA: Diagnosis not present

## 2020-12-08 DIAGNOSIS — I5032 Chronic diastolic (congestive) heart failure: Secondary | ICD-10-CM | POA: Diagnosis not present

## 2020-12-09 ENCOUNTER — Other Ambulatory Visit: Payer: Medicare Other

## 2020-12-11 DIAGNOSIS — E1149 Type 2 diabetes mellitus with other diabetic neurological complication: Secondary | ICD-10-CM | POA: Diagnosis not present

## 2020-12-11 DIAGNOSIS — E1129 Type 2 diabetes mellitus with other diabetic kidney complication: Secondary | ICD-10-CM | POA: Diagnosis not present

## 2020-12-11 DIAGNOSIS — N183 Chronic kidney disease, stage 3 unspecified: Secondary | ICD-10-CM | POA: Diagnosis not present

## 2020-12-11 DIAGNOSIS — I10 Essential (primary) hypertension: Secondary | ICD-10-CM | POA: Diagnosis not present

## 2020-12-16 ENCOUNTER — Other Ambulatory Visit: Payer: Medicare Other

## 2020-12-20 DIAGNOSIS — K529 Noninfective gastroenteritis and colitis, unspecified: Secondary | ICD-10-CM | POA: Diagnosis not present

## 2020-12-20 DIAGNOSIS — K921 Melena: Secondary | ICD-10-CM | POA: Diagnosis not present

## 2020-12-20 DIAGNOSIS — D500 Iron deficiency anemia secondary to blood loss (chronic): Secondary | ICD-10-CM | POA: Diagnosis not present

## 2020-12-23 ENCOUNTER — Other Ambulatory Visit: Payer: Self-pay

## 2020-12-23 ENCOUNTER — Encounter: Payer: Self-pay | Admitting: Neurology

## 2020-12-23 ENCOUNTER — Ambulatory Visit: Payer: Medicare Other | Admitting: Neurology

## 2020-12-23 VITALS — BP 150/62 | HR 62 | Ht 67.0 in | Wt 237.0 lb

## 2020-12-23 DIAGNOSIS — R269 Unspecified abnormalities of gait and mobility: Secondary | ICD-10-CM | POA: Diagnosis not present

## 2020-12-23 DIAGNOSIS — R413 Other amnesia: Secondary | ICD-10-CM | POA: Diagnosis not present

## 2020-12-23 DIAGNOSIS — I639 Cerebral infarction, unspecified: Secondary | ICD-10-CM | POA: Insufficient documentation

## 2020-12-23 MED ORDER — DONEPEZIL HCL 10 MG PO TABS: 10.0000 mg | ORAL_TABLET | Freq: Every day | ORAL | 4 refills | Status: DC

## 2020-12-23 NOTE — Progress Notes (Signed)
HISTORY OF PRESENT ILLNESS: He had a past medical history of hypertension, insulin-dependent diabetes, hyperlipidemia, coronary artery disease, diabetic peripheral neuropathy.  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  At baseline he ambulates with a left ankle brace, still active at home, manage his own finances, driving without difficulty, was able to fix home electricity such as lawn mow without difficulty, over the past 6 months, he became forgetful, he tends to repeat himself, forgot name,   He had MRI at [facility], there is evidence of old left parietal subcortical stroke, mild small vessel disease,  echocardiogram July 2013 showed ejection fraction 55%,RVSP of 36, mild aortic valvular stenosis,  BUN and creatinine 23/1 point 38, LDL 45, triglyceride 104,  He has not had further stroke or TIA symtoms. He is currently on Plavix and aspirin with moderate bruising. He is also taking Aricept, tolerating medication well, he has no recurrent strokelike symptoms, he complains of bilateral hand, and feet paresthesia, previously tried and failed different neuropathic pain medications, this including Neurontin, Lyrica, Cymbalta, He was put on amitriptyline around March 2015, 50 mg twice a day, tolerating it well, it does help him moderately, but he still complains of dense numbness of his bilateral fingertips, and toes.  UPDATE 12/11/2014: He is overall doing very well, tolerating current medications, amitriptyline 50 mg every night has helped his sleep, he continued to ambulate with left ankle brace  UPDATE Dec 22 2017:  He has worsening short term memory loss, he only drive short distance, he is  less active,  He has gait abnormality, rely on his cane all the time now, his wife help him with insulin shots,  UPDATE Dec 23 2020: Shortly after previous visit with Judson Roch in August 2021, he was found to have non-small cell lung cancer, presented with bilateral large mass in the left upper lobe, right lower lobe, with hilar and mediastinal lymphadenopathy  He was treated with chemotherapy, wife reported significant reaction, fatigue, later also admitted to the hospital for GI bleeding, anemia requiring blood transfusion  He was noted to have significant decline in functional status, worsening gait abnormality, increased fatigue, memory loss,  We personally reviewed MRI of the brain in August 2021, that was taken for his complaining of worsening memory loss, evidence of moderate generalized cortical atrophy, significantly progressed compared to previous scan in 2013, remote left parietal infarction with associated encephalomalacia, moderate periventricular small vessel disease, evidence of right posterior thalamus remote hemorrhage  Wife reported that since chemotherapy in August 2021, he had recurrent spells of staring off, lasting few seconds to minutes, but there was no seizure-like activity  He is taking Eliquis with diagnosis of atrial fibrillation  REVIEW OF SYSTEMS: Out of a complete 14 system review of symptoms, the patient complains only of the following symptoms, and all other reviewed systems are negative.   ALLERGIES: Allergies  Allergen Reactions  . Actos [Pioglitazone] Swelling  . Metformin And Related Nausea Only  . Niaspan [Niacin Er] Itching and Rash    HOME MEDICATIONS: Outpatient Medications Prior to Visit  Medication Sig Dispense Refill  . albuterol (VENTOLIN HFA) 108 (90 Base) MCG/ACT inhaler Inhale 2 puffs into the lungs every 6 (six) hours as needed for wheezing  or shortness of breath. 8 g 6  . allopurinol (ZYLOPRIM) 100 MG tablet Take 1 tablet (100 mg total) by  mouth daily. 30 tablet 6  . amitriptyline (ELAVIL) 50 MG tablet Take 75 mg by mouth at bedtime.     Marland Kitchen apixaban (ELIQUIS) 2.5 MG TABS tablet Take 1 tablet (2.5 mg total) by mouth 2 (two) times daily. 180 tablet 3  . Blood Glucose Monitoring Suppl (CONTOUR NEXT MONITOR) w/Device KIT 1 Device by Does not apply route 3 (three) times daily. Use to check blood sugars 3 times daily. Dx Code E13.9 1 kit 2  . CONTOUR NEXT TEST test strip USE 1 STRIP TO CHECK GLUCOSE 4 TIMES DAILY 300 each 1  . donepezil (ARICEPT) 10 MG tablet TAKE ONE TABLET DAILY.  PLEASE CALL [phone] TO SCHEDULE FOLLOW UP. (Patient taking differently: Take 10 mg by mouth daily.) 90 tablet 0  . feeding supplement, GLUCERNA SHAKE, (GLUCERNA SHAKE) LIQD Take 237 mLs by mouth 3 (three) times daily between meals. 1062 mL 0  . folic acid (FOLVITE) 1 MG tablet Take 1 tablet (1 mg total) by mouth daily. 30 tablet 4  . furosemide (LASIX) 40 MG tablet Take 1 tablet (40 mg total) by mouth in the morning. 30 tablet 6  . insulin degludec (TRESIBA FLEXTOUCH) 200 UNIT/ML FlexTouch Pen Inject 24 Units into the skin at bedtime. (Patient taking differently: Inject 20 Units into the skin at bedtime. 24 units on Chemo days)    . insulin lispro (HUMALOG) 100 UNIT/ML cartridge Inject 5-15 Units into the skin 3 (three) times daily with meals. Sliding scale.    . Insulin Pen Needle (EASY COMFORT PEN NEEDLES) 33G X 4 MM MISC 1 each by Does not apply route See admin instructions. Use to inject insulin 5 times daily. 150 each 3  . levothyroxine (SYNTHROID, LEVOTHROID) 75 MCG tablet Take 75 mcg by mouth daily before breakfast.   1  . lidocaine (XYLOCAINE) 2 % solution Use as directed 15 mLs in the mouth or throat as needed for mouth pain. 100 mL 0  . lidocaine-prilocaine (EMLA) cream Apply 1 application topically as needed. Apply a teaspoon over port site at least 1 hour prior to lab appt. Do not rub in and cover with plastic wrap. 30 g 0  . lisinopril (ZESTRIL)  10 MG tablet TAKE ONE TABLET DAILY (Patient taking differently: Take 10 mg by mouth daily.) 90 tablet 2  . metFORMIN (GLUCOPHAGE-XR) 750 MG 24 hr tablet TAKE 1 TABLET WITH DINNER 90 tablet 0  . metoprolol succinate (TOPROL-XL) 25 MG 24 hr tablet Take 1 tablet (25 mg total) by mouth daily. 90 tablet 3  . Nutritional Supplements (ENSURE PO) Take by mouth as needed.    . ondansetron (ZOFRAN) 4 MG tablet Take 1 tablet (4 mg total) by mouth every 6 (six) hours as needed for nausea. 20 tablet 0  . OXYGEN Inhale 2 L into the lungs. At bedtime and during the day prn    . pantoprazole (PROTONIX) 40 MG tablet Take 1 tablet (40 mg total) by mouth 2 (two) times daily before a meal. 60 tablet 0  . potassium chloride SA (K-DUR,KLOR-CON) 20 MEQ tablet Take 20 mEq by mouth 2 (two) times daily.     . prochlorperazine (COMPAZINE) 10 MG tablet Take 1 tablet (10 mg total) by mouth every 6 (six) hours as needed for nausea or vomiting. 30 tablet 0  . rosuvastatin (CRESTOR) 20 MG tablet Take 20 mg by mouth daily.    Marland Kitchen  Tamsulosin HCl (FLOMAX) 0.4 MG CAPS Take 0.4 mg by mouth daily.     . vitamin B-12 (CYANOCOBALAMIN) 1000 MCG tablet Take 1,000 mcg by mouth daily.     No facility-administered medications prior to visit.    PAST MEDICAL HISTORY: Past Medical History:  Diagnosis Date  . Aortic stenosis, mild 11/14/2013  . Aortic valve sclerosis 03/29/2015  . Bilateral leg edema 05/21/2014  . CAD (coronary artery disease)   . Chronic diastolic congestive heart failure ([place])   . Chronic kidney disease (CKD), stage III (moderate) (HCC)   . Diabetes 1.5, managed as type 1 ([place]) 02/04/2013  . Diabetic peripheral neuropathy associated with type 1 diabetes mellitus ([place]) 11/14/2013  . Dysphagia   . Dyspnea on exertion 03/21/2015  . Exogenous obesity   . Gout   . Heart attack ([place])   . History of nuclear stress test 07/2011   dipyridamole; fixed inferolateral defect, worse at stress than rest; no reversible ischemia; low risk  scan   . Hyperlipidemia   . Hypertension   . Hypothyroidism   . Insulin dependent diabetes mellitus   . Left foot drop   . Left main coronary artery disease 03/28/2015  . Memory loss   . Obesity (BMI 30-39.9) 11/14/2013  . Obstructive sleep apnea 03/21/2015  . OSA on CPAP    uses a cpap  . Peptic ulcer with hemorrhage 03/28/2015  . Peripheral neuropathy   . Pneumonia   . Rhabdomyolysis   . 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 Stroke ([place])    L patietal with small scattered lacunar infarcts  . Thrombocytopenia ([place]) 03/21/2015  . Venous insufficiency   . Weakness generalized 03/21/2015    PAST SURGICAL HISTORY: Past Surgical History:  Procedure Laterality Date  . BACK SURGERY  2002   lumbosacral. 11 back surgeries total  . BRONCHIAL BIOPSY  08/06/2020   Procedure: BRONCHIAL BIOPSIES;  Surgeon: Collene Gobble, MD;  Location: [facility] ENDOSCOPY;  Service: Pulmonary;;  . BRONCHIAL BRUSHINGS  08/06/2020   Procedure: BRONCHIAL BRUSHINGS;  Surgeon: Collene Gobble, MD;  Location: [facility] ENDOSCOPY;  Service: Pulmonary;;  . BRONCHIAL NEEDLE ASPIRATION BIOPSY  08/06/2020   Procedure: BRONCHIAL NEEDLE ASPIRATION BIOPSIES;  Surgeon: Collene Gobble, MD;  Location: [facility] ENDOSCOPY;  Service: Pulmonary;;  . Carotid Doppler  03/2013   bilat bulb/prox ICAs - mild amount of fibrous plaque with no evidence of diameter reduction  . CARPAL TUNNEL RELEASE Bilateral 08/09/2014   Procedure: BILATERAL CARPAL TUNNEL RELEASE;  Surgeon: Daryll Brod, MD;  Location: [facility];  Service: Orthopedics;  Laterality: Bilateral;  ANESTHESIA:  IV REGIONAL BIL FAB  . CHOLECYSTECTOMY    . COLONOSCOPY    . CORONARY ANGIOPLASTY  10/13/1996  . CORONARY ANGIOPLASTY  09/21/1989   emergency PTCA  . CORONARY ANGIOPLASTY  10/13/1996   Multi-Link diagonal & OD stenting (Dr. Marella Chimes)  . CORONARY ANGIOPLASTY  12/03/1997   disease of mid DX-1 ~50% & in mid PLA & PDA (distal lesions)  (Dr. Marella Chimes)   . CORONARY ANGIOPLASTY  10/14/1999   progression of disease distal PLA & PDA; progression of disease prox RCA - moderate (Dr. Marella Chimes)   . CORONARY ANGIOPLASTY WITH STENT PLACEMENT  04/04/2004   4.0x68m non-DES (thrombectomy via AngioJet) to RCA for high grade stenosis (Dr. RMarella Chimes  . CORONARY ARTERY BYPASS GRAFT N/A 03/29/2015   Procedure: CORONARY ARTERY BYPASS GRAFTING TIMES TWO USING  LEFT INTERNAL MAMMARY ARTERY AND RIGHT LEG GREATER SAPHENOUS VEIN HARVESTED ENDOSCOPICALLY.;  Surgeon: Rexene Alberts, MD;  Location: [facility];  Service: Open Heart Surgery;  Laterality: N/A;  . ESOPHAGOGASTRODUODENOSCOPY N/A 03/27/2015   Procedure: ESOPHAGOGASTRODUODENOSCOPY (EGD);  Surgeon: Clarene Essex, MD;  Location: [facility] ENDOSCOPY;  Service: Endoscopy;  Laterality: N/A;  possible dilation  . ESOPHAGOGASTRODUODENOSCOPY N/A 10/05/2020   Procedure: ESOPHAGOGASTRODUODENOSCOPY (EGD);  Surgeon: Arta Silence, MD;  Location: [facility] ENDOSCOPY;  Service: Endoscopy;  Laterality: N/A;  . FINE NEEDLE ASPIRATION  08/06/2020   Procedure: FINE NEEDLE ASPIRATION (FNA) LINEAR;  Surgeon: Collene Gobble, MD;  Location: [facility] ENDOSCOPY;  Service: Pulmonary;;  . IR IMAGING GUIDED PORT INSERTION  10/07/2020  . LEFT HEART CATHETERIZATION WITH CORONARY ANGIOGRAM N/A 03/28/2015   Procedure: LEFT HEART CATHETERIZATION WITH CORONARY ANGIOGRAM;  Surgeon: Peter M Martinique, MD;  Location: [facility] CATH LAB;  Service: Cardiovascular;  Laterality: N/A;  . SINUS ENDO W/FUSION    . TONSILLECTOMY    . TRANSTHORACIC ECHOCARDIOGRAM  08/08/2013   EF 55-60%, mild conc hypertrophy, grade 1 diastolic dysfunction; AV with mild stenosis; LA & RA mildly dilated  . VIDEO BRONCHOSCOPY WITH ENDOBRONCHIAL NAVIGATION N/A 08/06/2020   Procedure: VIDEO BRONCHOSCOPY WITH ENDOBRONCHIAL NAVIGATION;  Surgeon: Collene Gobble, MD;  Location: [facility] ENDOSCOPY;  Service: Pulmonary;  Laterality: N/A;  . VIDEO BRONCHOSCOPY WITH ENDOBRONCHIAL ULTRASOUND N/A  08/06/2020   Procedure: VIDEO BRONCHOSCOPY WITH ENDOBRONCHIAL ULTRASOUND;  Surgeon: Collene Gobble, MD;  Location: [facility] ENDOSCOPY;  Service: Pulmonary;  Laterality: N/A;    FAMILY HISTORY: Family History  Problem Relation Age of Onset  . Heart disease Mother   . Coronary artery disease Father   . Cancer Maternal Grandmother   . Heart Problems Maternal Grandfather   . Diabetes Son        borderline     SOCIAL HISTORY: Social History   Socioeconomic History  . Marital status: Married    Spouse name: Not on file  . Number of children: 3  . Years of education: 10  . Highest education level: Not on file  Occupational History  . Not on file  Tobacco Use  . Smoking status: Former Smoker    Packs/day: 2.00    Years: 20.00    Pack years: 40.00    Types: Cigarettes    Quit date: 01/25/1974    Years since quitting: 46.9  . Smokeless tobacco: Never Used  Vaping Use  . Vaping Use: Never used  Substance and Sexual Activity  . Alcohol use: No    Alcohol/week: 0.0 standard drinks  . Drug use: No  . Sexual activity: Not on file  Other Topics Concern  . Not on file  Social History Narrative   Lives with wife and son   Right handed    Caffeine: maybe 1 cup/day   Social Determinants of Health   Financial Resource Strain: Not on file  Food Insecurity: Not on file  Transportation Needs: Not on file  Physical Activity: Not on file  Stress: Not on file  Social Connections: Not on file  Intimate Partner Violence: Not on file   PHYSICAL EXAM  Vitals:   12/23/20 1132  BP: (!) 150/62  Pulse: 62  SpO2: 91%  Weight: 237 lb (107.5 kg)  Height: 5' 7" (1.702 m)   Body mass index is 37.12 kg/m.  Generalized: Elderly male sitting in wheelchair, slow reaction time MMSE - Mini Mental State Exam 12/23/2020 07/23/2020 12/22/2017  Orientation to time 2 4 4  Orientation to Place _0 Registration _1 Attention/ Calculation 0 1 4  Recall _2 Language- name 2 objects _3 Language- repeat _4 Language- follow 3 step command _5 Language- read & follow direction _6 Write a sentence 0 1 1  Copy design 0 0 1  Total score _7 Neurological examination  Mentation: Slow in following commands, history is mostly provided by his wife  Cranial nerve II-XII: Pupils were equal round reactive to light. Extraocular movements were full, visual field were full on confrontational test. Facial sensation and strength were normal. Head turning and shoulder shrug  were normal and symmetric. Motor: there is overall generalized weakness that is symmetric, wearing left AFO Coordination: No finger and truncal ataxia Reflexes: Depressed symmetric bilaterally Gait and station: Need to push hard to get up from seated position, unsteady, wearing left AFO, dragging left leg  DIAGNOSTIC DATA (LABS, IMAGING, TESTING) - I reviewed patient records, labs, notes, testing and imaging myself where available.  Lab Results  Component Value Date   WBC 5.2 12/02/2020   HGB 10.1 (L) 12/02/2020   HCT 31.8 (L) 12/02/2020   MCV 95.5 12/02/2020   PLT 126 (L) 12/02/2020      Component Value Date/Time   NA 142 12/02/2020 0845   K 4.6 12/02/2020 0845   CL 105 12/02/2020 0845   CO2 29 12/02/2020 0845   GLUCOSE 176 (H) 12/02/2020 0845   BUN 22 12/02/2020 0845   CREATININE 1.56 (H) 12/02/2020 0845   CREATININE 1.30 (H) 04/30/2020 1044   CALCIUM 9.0 12/02/2020 0845   PROT 6.7 12/02/2020 0845   ALBUMIN 3.2 (L) 12/02/2020 0845   AST 17 12/02/2020 0845   ALT 11 12/02/2020 0845   ALKPHOS 98 12/02/2020 0845   BILITOT 0.7 12/02/2020 0845   GFRNONAA 45 (L) 12/02/2020 0845   GFRNONAA 52 (L) 04/30/2020 1044   GFRAA >60 09/16/2020 0907   GFRAA 60 04/30/2020 1044   Lab Results  Component Value Date   CHOL 98 05/23/2019   HDL 38.40 (L) 05/23/2019   LDLCALC 43 05/23/2019   TRIG 83.0 05/23/2019   CHOLHDL 3 05/23/2019   Lab Results  Component Value Date   HGBA1C 6.7 (H)  10/06/2020   Lab Results  Component Value Date   VITAMINB12 3,201 (H) 10/04/2020   Lab Results  Component Value Date   TSH 3.288 12/02/2020      ASSESSMENT AND PLAN 80 y.o. year old male   Chronic left parietal stroke  MRI showed evidence of left parietal encephalomalacia  Vascular risk factors of aging, hypertension, diabetes, hyperlipidemia, chronic atrial fibrillation, on Eliquis  Wife reported patient has frequent spells of staring off, but there was no seizure activity, this could be related to his recent chemotherapy, deconditioning, excessive fatigue, but could not rule out possibility of partial seizure, I have advised his wife to continue observing the symptoms, call clinic if there is more frequent occurrence, or seizure-like activity  Cognitive impairment  Worsening generalized atrophy, and evidence of chronic left parietal stroke encephalomalacia,  Likely combination of central nervous system degenerative disorder with a vascular component, also made worse by his recent diagnosis of lung cancer, chemotherapy  Refilled his Aricept 10 mg daily, may hold off if he continue complains of decreased appetite  Gait abnormalities  Left AFO, history of previous injury with rhabdomyolysis,  Receiving home physical  therapy   Marcial Pacas, M.D. Ph.D.  [facility] [place], [place] [address] Phone: [phone] Fax:      [phone]

## 2020-12-24 ENCOUNTER — Encounter: Payer: Self-pay | Admitting: Internal Medicine

## 2020-12-24 ENCOUNTER — Inpatient Hospital Stay: Payer: Medicare Other | Attending: Internal Medicine | Admitting: Internal Medicine

## 2020-12-24 ENCOUNTER — Inpatient Hospital Stay: Payer: Medicare Other

## 2020-12-24 ENCOUNTER — Other Ambulatory Visit: Payer: Self-pay

## 2020-12-24 VITALS — BP 128/52 | HR 61 | Temp 98.8°F | Resp 14 | Ht 67.0 in | Wt 238.7 lb

## 2020-12-24 DIAGNOSIS — N183 Chronic kidney disease, stage 3 unspecified: Secondary | ICD-10-CM | POA: Insufficient documentation

## 2020-12-24 DIAGNOSIS — C3491 Malignant neoplasm of unspecified part of right bronchus or lung: Secondary | ICD-10-CM

## 2020-12-24 DIAGNOSIS — C3490 Malignant neoplasm of unspecified part of unspecified bronchus or lung: Secondary | ICD-10-CM

## 2020-12-24 DIAGNOSIS — E039 Hypothyroidism, unspecified: Secondary | ICD-10-CM | POA: Insufficient documentation

## 2020-12-24 DIAGNOSIS — C3411 Malignant neoplasm of upper lobe, right bronchus or lung: Secondary | ICD-10-CM | POA: Insufficient documentation

## 2020-12-24 DIAGNOSIS — Z5112 Encounter for antineoplastic immunotherapy: Secondary | ICD-10-CM | POA: Diagnosis not present

## 2020-12-24 DIAGNOSIS — E1122 Type 2 diabetes mellitus with diabetic chronic kidney disease: Secondary | ICD-10-CM | POA: Insufficient documentation

## 2020-12-24 DIAGNOSIS — I130 Hypertensive heart and chronic kidney disease with heart failure and stage 1 through stage 4 chronic kidney disease, or unspecified chronic kidney disease: Secondary | ICD-10-CM | POA: Diagnosis not present

## 2020-12-24 DIAGNOSIS — Z95828 Presence of other vascular implants and grafts: Secondary | ICD-10-CM

## 2020-12-24 DIAGNOSIS — D6481 Anemia due to antineoplastic chemotherapy: Secondary | ICD-10-CM

## 2020-12-24 DIAGNOSIS — Z79899 Other long term (current) drug therapy: Secondary | ICD-10-CM | POA: Insufficient documentation

## 2020-12-24 DIAGNOSIS — T451X5A Adverse effect of antineoplastic and immunosuppressive drugs, initial encounter: Secondary | ICD-10-CM

## 2020-12-24 DIAGNOSIS — C3412 Malignant neoplasm of upper lobe, left bronchus or lung: Secondary | ICD-10-CM | POA: Diagnosis not present

## 2020-12-24 DIAGNOSIS — I2510 Atherosclerotic heart disease of native coronary artery without angina pectoris: Secondary | ICD-10-CM | POA: Insufficient documentation

## 2020-12-24 LAB — CBC WITH DIFFERENTIAL (CANCER CENTER ONLY)
Abs Immature Granulocytes: 0.02 10*3/uL (ref 0.00–0.07)
Basophils Absolute: 0 10*3/uL (ref 0.0–0.1)
Basophils Relative: 1 %
Eosinophils Absolute: 0.1 10*3/uL (ref 0.0–0.5)
Eosinophils Relative: 1 %
HCT: 31.8 % — ABNORMAL LOW (ref 39.0–52.0)
Hemoglobin: 10.1 g/dL — ABNORMAL LOW (ref 13.0–17.0)
Immature Granulocytes: 0 %
Lymphocytes Relative: 19 %
Lymphs Abs: 1.1 10*3/uL (ref 0.7–4.0)
MCH: 29.6 pg (ref 26.0–34.0)
MCHC: 31.8 g/dL (ref 30.0–36.0)
MCV: 93.3 fL (ref 80.0–100.0)
Monocytes Absolute: 0.8 10*3/uL (ref 0.1–1.0)
Monocytes Relative: 13 %
Neutro Abs: 3.9 10*3/uL (ref 1.7–7.7)
Neutrophils Relative %: 66 %
Platelet Count: 106 10*3/uL — ABNORMAL LOW (ref 150–400)
RBC: 3.41 MIL/uL — ABNORMAL LOW (ref 4.22–5.81)
RDW: 16 % — ABNORMAL HIGH (ref 11.5–15.5)
WBC Count: 5.9 10*3/uL (ref 4.0–10.5)
nRBC: 0 % (ref 0.0–0.2)

## 2020-12-24 LAB — CMP (CANCER CENTER ONLY)
ALT: 8 U/L (ref 0–44)
AST: 14 U/L — ABNORMAL LOW (ref 15–41)
Albumin: 3.1 g/dL — ABNORMAL LOW (ref 3.5–5.0)
Alkaline Phosphatase: 95 U/L (ref 38–126)
Anion gap: 7 (ref 5–15)
BUN: 18 mg/dL (ref 8–23)
CO2: 29 mmol/L (ref 22–32)
Calcium: 9.2 mg/dL (ref 8.9–10.3)
Chloride: 106 mmol/L (ref 98–111)
Creatinine: 1.56 mg/dL — ABNORMAL HIGH (ref 0.61–1.24)
GFR, Estimated: 45 mL/min — ABNORMAL LOW (ref >60)
Glucose, Bld: 114 mg/dL — ABNORMAL HIGH (ref 70–99)
Potassium: 4.2 mmol/L (ref 3.5–5.1)
Sodium: 142 mmol/L (ref 135–145)
Total Bilirubin: 0.5 mg/dL (ref 0.3–1.2)
Total Protein: 6.5 g/dL (ref 6.5–8.1)

## 2020-12-24 LAB — SAMPLE TO BLOOD BANK

## 2020-12-24 LAB — TSH: TSH: 2.585 u[IU]/mL (ref 0.320–4.118)

## 2020-12-24 MED ORDER — SODIUM CHLORIDE 0.9% FLUSH
10.0000 mL | INTRAVENOUS | Status: DC | PRN
  Administered 2020-12-24: 10 mL
  Filled 2020-12-24: qty 10

## 2020-12-24 MED ORDER — SODIUM CHLORIDE 0.9 % IV SOLN
Freq: Once | INTRAVENOUS | Status: AC
  Filled 2020-12-24: qty 250

## 2020-12-24 MED ORDER — SODIUM CHLORIDE 0.9 % IV SOLN
200.0000 mg | Freq: Once | INTRAVENOUS | Status: AC
  Administered 2020-12-24: 200 mg via INTRAVENOUS
  Filled 2020-12-24: qty 8

## 2020-12-24 MED ORDER — SODIUM CHLORIDE 0.9% FLUSH
10.0000 mL | INTRAVENOUS | Status: DC | PRN
  Administered 2020-12-24: 10 mL via INTRAVENOUS
  Filled 2020-12-24: qty 10

## 2020-12-24 MED ORDER — HEPARIN SOD (PORK) LOCK FLUSH 100 UNIT/ML IV SOLN
500.0000 [IU] | Freq: Once | INTRAVENOUS | Status: AC | PRN
  Administered 2020-12-24: 500 [IU]
  Filled 2020-12-24: qty 5

## 2020-12-24 NOTE — Patient Instructions (Signed)
[facility] Discharge Instructions for Patients Receiving Chemotherapy  Today you received the following chemotherapy agents: pembrolizumab.  To help prevent nausea and vomiting after your treatment, we encourage you to take your nausea medication as directed.   If you develop nausea and vomiting that is not controlled by your nausea medication, call the clinic.   BELOW ARE SYMPTOMS THAT SHOULD BE REPORTED IMMEDIATELY:  *FEVER GREATER THAN 100.5 F  *CHILLS WITH OR WITHOUT FEVER  NAUSEA AND VOMITING THAT IS NOT CONTROLLED WITH YOUR NAUSEA MEDICATION  *UNUSUAL SHORTNESS OF BREATH  *UNUSUAL BRUISING OR BLEEDING  TENDERNESS IN MOUTH AND THROAT WITH OR WITHOUT PRESENCE OF ULCERS  *URINARY PROBLEMS  *BOWEL PROBLEMS  UNUSUAL RASH Items with * indicate a potential emergency and should be followed up as soon as possible.  Feel free to call the clinic should you have any questions or concerns. The clinic phone number is [phone].  Please show the CHEMO ALERT CARD at check-in to the Emergency Department and triage nurse.   

## 2020-12-24 NOTE — Progress Notes (Signed)
Per Dr. Julien Nordmann, ok to treat with elevated creatinine 1.56.

## 2020-12-24 NOTE — Progress Notes (Signed)
[place] [phone]   [phone]) [phone]  OFFICE PROGRESS NOTE  [name], MD [address]  [place] [address]  DIAGNOSIS: Stage IV (T4, N2, M1 a) non-small cell lung cancer, mucinous adenocarcinoma presented with bilateral large masses in the left upper lobe as well as the right lower lobe in addition to other small nodules in the right upper lobe with hilar and mediastinal lymphadenopathy diagnosed in August 2021.  Biomarker Findings Microsatellite status - MS-Stable Tumor Mutational Burden - 4 Muts/Mb Genomic Findings For a complete list of the genes assayed, please refer to the Appendix. KRAS G12C GNAS R201H 7 Disease relevant genes with no reportable alterations: ALK, BRAF, EGFR, ERBB2, MET, RET, ROS1  PDL1 Expression: 20%.  PRIOR THERAPY: None  CURRENT THERAPY: Systemic chemotherapy with carboplatin for AUC of 5, Alimta 500 mg/M2 and Keytruda 200 mg IV every 3 weeks.  First dose 09/09/2020.  Status post 5 cycles.   INTERVAL HISTORY: Randall Wilkins 80 y.o. male returns to the clinic today for follow-up visit accompanied by his wife. The patient is feeling fine today with no concerning complaints except for shortness of breath with exertion. He denied having any current chest pain, cough or hemoptysis. He denied having any nausea, vomiting, diarrhea or constipation. He has a lack of appetite but he did not lose much weight. He denied having any headache or visual changes. He is here today for evaluation before starting cycle #6 of his treatment.  MEDICAL HISTORY: Past Medical History:  Diagnosis Date  . Aortic stenosis, mild 11/14/2013  . Aortic valve sclerosis 03/29/2015  . Bilateral leg edema 05/21/2014  . CAD (coronary artery disease)   . Chronic diastolic congestive heart failure ([place])   . Chronic kidney disease (CKD), stage III (moderate) ([facility])   . Diabetes 1.5, managed as type 1 ([place]) 02/04/2013  . Diabetic  peripheral neuropathy associated with type 1 diabetes mellitus ( [place]) 11/14/2013  . Dysphagia   . Dyspnea on exertion 03/21/2015  . Exogenous obesity   . Gout   . Heart attack ([place])   . History of nuclear stress test 07/2011   dipyridamole; fixed inferolateral defect, worse at stress than rest; no reversible ischemia; low risk scan   . Hyperlipidemia   . Hypertension   . Hypothyroidism   . Insulin dependent diabetes mellitus   . Left foot drop   . Left main coronary artery disease 03/28/2015  . Memory loss   . Obesity (BMI 30-39.9) 11/14/2013  . Obstructive sleep apnea 03/21/2015  . OSA on CPAP    uses a cpap  . Peptic ulcer with hemorrhage 03/28/2015  . Peripheral neuropathy   . Pneumonia   . Rhabdomyolysis   . 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 Stroke ([place])    L patietal with small scattered lacunar infarcts  . Thrombocytopenia ([place]) 03/21/2015  . Venous insufficiency   . Weakness generalized 03/21/2015    ALLERGIES:  is allergic to actos [pioglitazone], metformin and related, and niaspan [niacin er].  MEDICATIONS:  Current Outpatient Medications  Medication Sig Dispense Refill  . albuterol (VENTOLIN HFA) 108 (90 Base) MCG/ACT inhaler Inhale 2 puffs into the lungs every 6 (six) hours as needed for wheezing or shortness of breath. 8 g 6  . allopurinol (ZYLOPRIM) 100 MG tablet Take 1 tablet (100 mg total) by mouth daily. 30 tablet 6  . amitriptyline (ELAVIL)  50 MG tablet Take 75 mg by mouth at bedtime.     Marland Kitchen apixaban (ELIQUIS) 2.5 MG TABS tablet Take 1 tablet (2.5 mg total) by mouth 2 (two) times daily. 180 tablet 3  . Blood Glucose Monitoring Suppl (CONTOUR NEXT MONITOR) w/Device KIT 1 Device by Does not apply route 3 (three) times daily. Use to check blood sugars 3 times daily. Dx Code E13.9 1 kit 2  . CONTOUR NEXT TEST test strip USE 1 STRIP TO CHECK GLUCOSE 4 TIMES DAILY 300 each 1  . donepezil (ARICEPT) 10 MG tablet Take 1 tablet (10 mg  total) by mouth daily. 90 tablet 4  . feeding supplement, GLUCERNA SHAKE, (GLUCERNA SHAKE) LIQD Take 237 mLs by mouth 3 (three) times daily between meals. 1610 mL 0  . folic acid (FOLVITE) 1 MG tablet Take 1 tablet (1 mg total) by mouth daily. 30 tablet 4  . furosemide (LASIX) 40 MG tablet Take 1 tablet (40 mg total) by mouth in the morning. 30 tablet 6  . insulin degludec (TRESIBA FLEXTOUCH) 200 UNIT/ML FlexTouch Pen Inject 24 Units into the skin at bedtime. (Patient taking differently: Inject 20 Units into the skin at bedtime. 24 units on Chemo days)    . insulin lispro (HUMALOG) 100 UNIT/ML cartridge Inject 5-15 Units into the skin 3 (three) times daily with meals. Sliding scale.    . Insulin Pen Needle (EASY COMFORT PEN NEEDLES) 33G X 4 MM MISC 1 each by Does not apply route See admin instructions. Use to inject insulin 5 times daily. 150 each 3  . levothyroxine (SYNTHROID, LEVOTHROID) 75 MCG tablet Take 75 mcg by mouth daily before breakfast.   1  . lidocaine (XYLOCAINE) 2 % solution Use as directed 15 mLs in the mouth or throat as needed for mouth pain. 100 mL 0  . lidocaine-prilocaine (EMLA) cream Apply 1 application topically as needed. Apply a teaspoon over port site at least 1 hour prior to lab appt. Do not rub in and cover with plastic wrap. 30 g 0  . lisinopril (ZESTRIL) 10 MG tablet TAKE ONE TABLET DAILY (Patient taking differently: Take 10 mg by mouth daily.) 90 tablet 2  . metFORMIN (GLUCOPHAGE-XR) 750 MG 24 hr tablet TAKE 1 TABLET WITH DINNER 90 tablet 0  . metoprolol succinate (TOPROL-XL) 25 MG 24 hr tablet Take 1 tablet (25 mg total) by mouth daily. 90 tablet 3  . Nutritional Supplements (ENSURE PO) Take by mouth as needed.    . ondansetron (ZOFRAN) 4 MG tablet Take 1 tablet (4 mg total) by mouth every 6 (six) hours as needed for nausea. 20 tablet 0  . OXYGEN Inhale 2 L into the lungs. At bedtime and during the day prn    . pantoprazole (PROTONIX) 40 MG tablet Take 1 tablet (40 mg  total) by mouth 2 (two) times daily before a meal. 60 tablet 0  . potassium chloride SA (K-DUR,KLOR-CON) 20 MEQ tablet Take 20 mEq by mouth 2 (two) times daily.     . prochlorperazine (COMPAZINE) 10 MG tablet Take 1 tablet (10 mg total) by mouth every 6 (six) hours as needed for nausea or vomiting. 30 tablet 0  . rosuvastatin (CRESTOR) 20 MG tablet Take 20 mg by mouth daily.    . Tamsulosin HCl (FLOMAX) 0.4 MG CAPS Take 0.4 mg by mouth daily.     . vitamin B-12 (CYANOCOBALAMIN) 1000 MCG tablet Take 1,000 mcg by mouth daily.     Current Facility-Administered Medications  Medication Dose  Route Frequency Provider Last Rate Last Admin  . sodium chloride flush (NS) 0.9 % injection 10 mL  10 mL Intravenous PRN Curt Bears, MD   10 mL at 12/24/20 0940    SURGICAL HISTORY:  Past Surgical History:  Procedure Laterality Date  . BACK SURGERY  2002   lumbosacral. 11 back surgeries total  . BRONCHIAL BIOPSY  08/06/2020   Procedure: BRONCHIAL BIOPSIES;  Surgeon: Collene Gobble, MD;  Location: [facility] ENDOSCOPY;  Service: Pulmonary;;  . BRONCHIAL BRUSHINGS  08/06/2020   Procedure: BRONCHIAL BRUSHINGS;  Surgeon: Collene Gobble, MD;  Location: [facility] ENDOSCOPY;  Service: Pulmonary;;  . BRONCHIAL NEEDLE ASPIRATION BIOPSY  08/06/2020   Procedure: BRONCHIAL NEEDLE ASPIRATION BIOPSIES;  Surgeon: Collene Gobble, MD;  Location: [facility] ENDOSCOPY;  Service: Pulmonary;;  . Carotid Doppler  03/2013   bilat bulb/prox ICAs - mild amount of fibrous plaque with no evidence of diameter reduction  . CARPAL TUNNEL RELEASE Bilateral 08/09/2014   Procedure: BILATERAL CARPAL TUNNEL RELEASE;  Surgeon: Daryll Brod, MD;  Location: [facility];  Service: Orthopedics;  Laterality: Bilateral;  ANESTHESIA:  IV REGIONAL BIL FAB  . CHOLECYSTECTOMY    . COLONOSCOPY    . CORONARY ANGIOPLASTY  10/13/1996  . CORONARY ANGIOPLASTY  09/21/1989   emergency PTCA  . CORONARY ANGIOPLASTY  10/13/1996   Multi-Link diagonal & OD stenting  (Dr. Marella Chimes)  . CORONARY ANGIOPLASTY  12/03/1997   disease of mid DX-1 ~50% & in mid PLA & PDA (distal lesions) (Dr. Marella Chimes)   . CORONARY ANGIOPLASTY  10/14/1999   progression of disease distal PLA & PDA; progression of disease prox RCA - moderate (Dr. Marella Chimes)   . CORONARY ANGIOPLASTY WITH STENT PLACEMENT  04/04/2004   4.0x40m non-DES (thrombectomy via AngioJet) to RCA for high grade stenosis (Dr. RMarella Chimes  . CORONARY ARTERY BYPASS GRAFT N/A 03/29/2015   Procedure: CORONARY ARTERY BYPASS GRAFTING TIMES TWO USING LEFT INTERNAL MAMMARY ARTERY AND RIGHT LEG GREATER SAPHENOUS VEIN HARVESTED ENDOSCOPICALLY.;  Surgeon: CRexene Alberts MD;  [place] [facility]  Service: Open Heart Surgery;  Laterality: N/A;  . ESOPHAGOGASTRODUODENOSCOPY N/A 03/27/2015   Procedure: ESOPHAGOGASTRODUODENOSCOPY (EGD);  Surgeon: MClarene Essex MD;  [facility];  Service: Endoscopy;  Laterality: N/A;  possible dilation  . ESOPHAGOGASTRODUODENOSCOPY N/A 10/05/2020   Procedure: ESOPHAGOGASTRODUODENOSCOPY (EGD);  Surgeon: OArta Silence MD;  [facility];  Service: Endoscopy;  Laterality: N/A;  . FINE NEEDLE ASPIRATION  08/06/2020   Procedure: FINE NEEDLE ASPIRATION (FNA) LINEAR;  Surgeon: BCollene Gobble MD;  [facility];  Service: Pulmonary;;  . IR IMAGING GUIDED PORT INSERTION  10/07/2020  . LEFT HEART CATHETERIZATION WITH CORONARY ANGIOGRAM N/A 03/28/2015   Procedure: LEFT HEART CATHETERIZATION WITH CORONARY ANGIOGRAM;  Surgeon: Peter M JMartinique MD;  [facility];  Service: Cardiovascular;  Laterality: N/A;  . SINUS ENDO W/FUSION    . TONSILLECTOMY    . TRANSTHORACIC ECHOCARDIOGRAM  08/08/2013   EF 55-60%, mild conc hypertrophy, grade 1 diastolic dysfunction; AV with mild stenosis; LA & RA mildly dilated  . VIDEO BRONCHOSCOPY WITH ENDOBRONCHIAL NAVIGATION N/A 08/06/2020   Procedure: VIDEO BRONCHOSCOPY WITH ENDOBRONCHIAL NAVIGATION;  Surgeon: BCollene Gobble MD;   [facility];  Service: Pulmonary;  Laterality: N/A;  . VIDEO BRONCHOSCOPY WITH ENDOBRONCHIAL ULTRASOUND N/A 08/06/2020   Procedure: VIDEO BRONCHOSCOPY WITH ENDOBRONCHIAL ULTRASOUND;  Surgeon: BCollene Gobble MD;  [facility];  Service: Pulmonary;  Laterality: N/A;    REVIEW OF SYSTEMS:  A comprehensive review of systems was negative except for: Constitutional: positive for anorexia and fatigue Respiratory: positive for dyspnea on exertion   PHYSICAL EXAMINATION: General appearance: alert, cooperative, fatigued and no distress Head: Normocephalic, without obvious abnormality, atraumatic Neck: no adenopathy, no JVD, supple, symmetrical, trachea midline and thyroid not enlarged, symmetric, no tenderness/mass/nodules Lymph nodes: Cervical, supraclavicular, and axillary nodes normal. Resp: clear to auscultation bilaterally Back: symmetric, no curvature. ROM normal. No CVA tenderness. Cardio: regular rate and rhythm, S1, S2 normal, no murmur, click, rub or gallop GI: soft, non-tender; bowel sounds normal; no masses,  no organomegaly Extremities: extremities normal, atraumatic, no cyanosis or edema  ECOG PERFORMANCE STATUS: 1 - Symptomatic but completely ambulatory  Blood pressure (!) 128/52, pulse 61, temperature 98.8 F (37.1 C), temperature source Tympanic, resp. rate 14, height '5\' 7"'  (1.702 m), weight 238 lb 11.2 oz (108.3 kg), SpO2 95 %.  LABORATORY DATA: Lab Results  Component Value Date   WBC 5.2 12/02/2020   HGB 10.1 (L) 12/02/2020   HCT 31.8 (L) 12/02/2020   MCV 95.5 12/02/2020   PLT 126 (L) 12/02/2020      Chemistry      Component Value Date/Time   NA 142 12/02/2020 0845   K 4.6 12/02/2020 0845   CL 105 12/02/2020 0845   CO2 29 12/02/2020 0845   BUN 22 12/02/2020 0845   CREATININE 1.56 (H) 12/02/2020 0845   CREATININE 1.30 (H) 04/30/2020 1044      Component Value Date/Time   CALCIUM 9.0 12/02/2020 0845   ALKPHOS 98 12/02/2020 0845   AST 17  12/02/2020 0845   ALT 11 12/02/2020 0845   BILITOT 0.7 12/02/2020 0845       RADIOGRAPHIC STUDIES: No results found.  ASSESSMENT AND PLAN: This is a very pleasant 80 years old white male diagnosed with a stage IV (T4, N2, M1 a) non-small cell lung cancer, mucinous adenocarcinoma presented with bilateral large masses in the left upper lobe as well as the right upper lobe in addition to other pulmonary nodules in the right upper lobe with hilar and mediastinal lymphadenopathy diagnosed in August 2021. Molecular study showed positive KRAS G12C mutation and PD-L1 expression of 20%. The patient is currently undergoing systemic chemotherapy with carboplatin for AUC of 5, Alimta 500 mg/M2 and Keytruda 200 mg IV every 3 weeks status post 5 cycles. Starting from cycle #5 the patient is on maintenance treatment with Alimta and Keytruda every 3 weeks. He tolerated the last cycle of his treatment much better. I recommended for him to proceed with cycle #6 today as planned. I will see him back for follow-up visit in 3 weeks for evaluation and repeat CT scan of the chest, abdomen pelvis for restaging of his disease. The patient was advised to call immediately if he has any concerning symptoms in the interval. The patient voices understanding of current disease status and treatment options and is in agreement with the current care plan.  All questions were answered. The patient knows to call the clinic with any problems, questions or concerns. We can certainly see the patient much sooner if necessary.  Disclaimer: This note was dictated with voice recognition software. Similar sounding words can inadvertently be transcribed and may not be corrected upon review.

## 2020-12-25 ENCOUNTER — Ambulatory Visit: Payer: Self-pay

## 2020-12-25 ENCOUNTER — Telehealth: Payer: Self-pay | Admitting: Internal Medicine

## 2020-12-25 ENCOUNTER — Encounter: Payer: Self-pay | Admitting: Physical Medicine and Rehabilitation

## 2020-12-25 ENCOUNTER — Ambulatory Visit (INDEPENDENT_AMBULATORY_CARE_PROVIDER_SITE_OTHER): Payer: Medicare Other | Admitting: Physical Medicine and Rehabilitation

## 2020-12-25 VITALS — BP 124/67 | HR 71

## 2020-12-25 DIAGNOSIS — M5416 Radiculopathy, lumbar region: Secondary | ICD-10-CM | POA: Diagnosis not present

## 2020-12-25 MED ORDER — METHYLPREDNISOLONE ACETATE 80 MG/ML IJ SUSP
80.0000 mg | Freq: Once | INTRAMUSCULAR | Status: AC
  Administered 2020-12-25: 80 mg

## 2020-12-25 NOTE — Telephone Encounter (Signed)
Scheduled appt per 1/11 los - pt to get an updated schedule next visit.

## 2020-12-25 NOTE — Progress Notes (Signed)
Pt state lower back pain. Pt state walking makes the pain worse.Pt has to walk with walker and sum time use his O2. Pt state he take pain meds and use heating pads to help ease the pain.  Numeric Pain Rating Scale and Functional Assessment Average Pain 6   In the last MONTH (on 0-10 scale) has pain interfered with the following?  1. General activity like being  able to carry out your everyday physical activities such as walking, climbing stairs, carrying groceries, or moving a chair?  Rating(10)   +Driver, +BT, -Dye Allergies.

## 2020-12-25 NOTE — Patient Instructions (Signed)

## 2021-01-06 ENCOUNTER — Other Ambulatory Visit (INDEPENDENT_AMBULATORY_CARE_PROVIDER_SITE_OTHER): Payer: Medicare Other

## 2021-01-06 ENCOUNTER — Other Ambulatory Visit: Payer: Self-pay

## 2021-01-06 DIAGNOSIS — E7800 Pure hypercholesterolemia, unspecified: Secondary | ICD-10-CM | POA: Diagnosis not present

## 2021-01-06 DIAGNOSIS — Z794 Long term (current) use of insulin: Secondary | ICD-10-CM | POA: Diagnosis not present

## 2021-01-06 DIAGNOSIS — E1165 Type 2 diabetes mellitus with hyperglycemia: Secondary | ICD-10-CM | POA: Diagnosis not present

## 2021-01-06 DIAGNOSIS — R2689 Other abnormalities of gait and mobility: Secondary | ICD-10-CM | POA: Diagnosis not present

## 2021-01-06 DIAGNOSIS — R262 Difficulty in walking, not elsewhere classified: Secondary | ICD-10-CM | POA: Diagnosis not present

## 2021-01-06 DIAGNOSIS — M6281 Muscle weakness (generalized): Secondary | ICD-10-CM | POA: Diagnosis not present

## 2021-01-06 LAB — LIPID PANEL
Cholesterol: 94 mg/dL (ref 0–200)
HDL: 36.2 mg/dL — ABNORMAL LOW (ref >39.00)
LDL Cholesterol: 36 mg/dL (ref 0–99)
NonHDL: 57.94
Total CHOL/HDL Ratio: 3
Triglycerides: 109 mg/dL (ref 0.0–149.0)
VLDL: 21.8 mg/dL (ref 0.0–40.0)

## 2021-01-06 LAB — BASIC METABOLIC PANEL
BUN: 24 mg/dL — ABNORMAL HIGH (ref 6–23)
CO2: 32 mEq/L (ref 19–32)
Calcium: 9.5 mg/dL (ref 8.4–10.5)
Chloride: 103 mEq/L (ref 96–112)
Creatinine, Ser: 1.43 mg/dL (ref 0.40–1.50)
GFR: 46.45 mL/min — ABNORMAL LOW (ref >60.00)
Glucose, Bld: 106 mg/dL — ABNORMAL HIGH (ref 70–99)
Potassium: 4.1 mEq/L (ref 3.5–5.1)
Sodium: 143 mEq/L (ref 135–145)

## 2021-01-06 LAB — HEMOGLOBIN A1C: Hgb A1c MFr Bld: 6.4 % (ref 4.6–6.5)

## 2021-01-07 DIAGNOSIS — E1342 Other specified diabetes mellitus with diabetic polyneuropathy: Secondary | ICD-10-CM | POA: Diagnosis not present

## 2021-01-07 DIAGNOSIS — E1322 Other specified diabetes mellitus with diabetic chronic kidney disease: Secondary | ICD-10-CM | POA: Diagnosis not present

## 2021-01-07 DIAGNOSIS — I5032 Chronic diastolic (congestive) heart failure: Secondary | ICD-10-CM | POA: Diagnosis not present

## 2021-01-07 DIAGNOSIS — I130 Hypertensive heart and chronic kidney disease with heart failure and stage 1 through stage 4 chronic kidney disease, or unspecified chronic kidney disease: Secondary | ICD-10-CM | POA: Diagnosis not present

## 2021-01-07 DIAGNOSIS — E039 Hypothyroidism, unspecified: Secondary | ICD-10-CM | POA: Diagnosis not present

## 2021-01-07 DIAGNOSIS — C3491 Malignant neoplasm of unspecified part of right bronchus or lung: Secondary | ICD-10-CM | POA: Diagnosis not present

## 2021-01-07 DIAGNOSIS — E1351 Other specified diabetes mellitus with diabetic peripheral angiopathy without gangrene: Secondary | ICD-10-CM | POA: Diagnosis not present

## 2021-01-07 DIAGNOSIS — N1831 Chronic kidney disease, stage 3a: Secondary | ICD-10-CM | POA: Diagnosis not present

## 2021-01-07 DIAGNOSIS — I2510 Atherosclerotic heart disease of native coronary artery without angina pectoris: Secondary | ICD-10-CM | POA: Diagnosis not present

## 2021-01-07 DIAGNOSIS — G4733 Obstructive sleep apnea (adult) (pediatric): Secondary | ICD-10-CM | POA: Diagnosis not present

## 2021-01-07 DIAGNOSIS — I872 Venous insufficiency (chronic) (peripheral): Secondary | ICD-10-CM | POA: Diagnosis not present

## 2021-01-07 DIAGNOSIS — I480 Paroxysmal atrial fibrillation: Secondary | ICD-10-CM | POA: Diagnosis not present

## 2021-01-07 DIAGNOSIS — M109 Gout, unspecified: Secondary | ICD-10-CM | POA: Diagnosis not present

## 2021-01-07 DIAGNOSIS — D62 Acute posthemorrhagic anemia: Secondary | ICD-10-CM | POA: Diagnosis not present

## 2021-01-07 DIAGNOSIS — K2091 Esophagitis, unspecified with bleeding: Secondary | ICD-10-CM | POA: Diagnosis not present

## 2021-01-07 DIAGNOSIS — E785 Hyperlipidemia, unspecified: Secondary | ICD-10-CM | POA: Diagnosis not present

## 2021-01-08 DIAGNOSIS — E118 Type 2 diabetes mellitus with unspecified complications: Secondary | ICD-10-CM | POA: Diagnosis not present

## 2021-01-08 DIAGNOSIS — Z794 Long term (current) use of insulin: Secondary | ICD-10-CM | POA: Diagnosis not present

## 2021-01-08 DIAGNOSIS — C3491 Malignant neoplasm of unspecified part of right bronchus or lung: Secondary | ICD-10-CM | POA: Diagnosis not present

## 2021-01-09 ENCOUNTER — Other Ambulatory Visit: Payer: Self-pay

## 2021-01-09 ENCOUNTER — Encounter: Payer: Self-pay | Admitting: Endocrinology

## 2021-01-09 ENCOUNTER — Ambulatory Visit: Payer: Medicare Other | Admitting: Endocrinology

## 2021-01-09 VITALS — BP 134/84 | HR 61 | Ht 67.0 in | Wt 234.4 lb

## 2021-01-09 DIAGNOSIS — I10 Essential (primary) hypertension: Secondary | ICD-10-CM

## 2021-01-09 DIAGNOSIS — E1165 Type 2 diabetes mellitus with hyperglycemia: Secondary | ICD-10-CM | POA: Diagnosis not present

## 2021-01-09 DIAGNOSIS — Z794 Long term (current) use of insulin: Secondary | ICD-10-CM

## 2021-01-09 DIAGNOSIS — E7800 Pure hypercholesterolemia, unspecified: Secondary | ICD-10-CM

## 2021-01-09 NOTE — Progress Notes (Signed)
[facility] OFFICE PROGRESS NOTE  Wilkins, Robert, MD [address] [place] [address]  DIAGNOSIS: Stage IV (T4, N2, M1 a) non-small cell lung cancer, mucinous adenocarcinoma presented with bilateral large masses in the left upper lobe as well as the right lower lobe in addition to other small nodules in the right upper lobe with hilar and mediastinal lymphadenopathy diagnosed in August 2021.  Biomarker Findings Microsatellite status - MS-Stable Tumor Mutational Burden - 4 Muts/Mb Genomic Findings For a complete list of the genes assayed, please refer to the Appendix. KRAS G12C GNAS R201H 7 Disease relevant genes with no reportable alterations: ALK, BRAF, EGFR, ERBB2, MET, RET, ROS1  PDL1 Expression: 20%.  PRIOR THERAPY:  1) Systemic chemotherapy with carboplatin for AUC of 5, Alimta 500 mg/M2 and Keytruda 200 mg IV every 3 weeks. First dose 09/09/2020. Status post 6cycles.Dose reduced to carboplatin for an AUC of 4 from cycle #3and Alimta held with cycle #4.Chemotherapy with alimta and carboplatin removed from treatment plan starting from cycle #4 and he will start single agent Keytruda  CURRENT THERAPY: Sotorisib (Lumakras) 960 mg daily. First dose expected in the next few days.   INTERVAL HISTORY: Randall Wilkins 80 y.o. male returns to the clinic today for a follow-up visit accompanied by his wife.  The patient is feeling fairly well today without any concerning complaints. The patient tolerated his last cycle of treatment well without any concerning adverse side effects.  The patient is currently on single agent immunotherapy with Keytruda.  Alimta was discontinued due to elevated creatinine.  The patient denies any fever, chills, or night sweats.  He denies any weight loss. The patient states his breathing is "good". He wears supplemental oxygen as needed at home at night and with exertion such as bathing/showering. He has shortness of breath  "every now and then".  He denies cough.The patient denies any chest pain or hemoptysis. He denies sore throat or nasal congestion. He denies any sick contacts. He denies any nausea, vomiting, diarrhea, or constipation.  He denies any headaches. The patient denies any rashes or skin changes.  The patient recently had a restaging CT scan performed.  The patient is here today for evaluation to review his scan results before starting cycle #7.    MEDICAL HISTORY: Past Medical History:  Diagnosis Date  . Aortic stenosis, mild 11/14/2013  . Aortic valve sclerosis 03/29/2015  . Bilateral leg edema 05/21/2014  . CAD (coronary artery disease)   . Chronic diastolic congestive heart failure (HCC)   . Chronic kidney disease (CKD), stage III (moderate) (HCC)   . 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 Diabetic peripheral neuropathy associated with type 1 diabetes mellitus (HCC) 11/14/2013  . Dysphagia   . Dyspnea on exertion 03/21/2015  . Exogenous obesity   . Gout   . Heart attack (HCC)   . History of nuclear stress test 07/2011   dipyridamole; fixed inferolateral defect, worse at stress than rest; no reversible ischemia; low risk scan   . Hyperlipidemia   . Hypertension   . Hypothyroidism   . Insulin dependent diabetes mellitus   . Left foot drop   . Left main coronary artery disease 03/28/2015  . lung ca dx'd 08/2020  . Memory loss   . Obesity (BMI 30-39.9) 11/14/2013  . Obstructive sleep apnea 03/21/2015  . OSA on CPAP    uses a cpap  . Peptic ulcer with hemorrhage 03/28/2015  . Peripheral neuropathy   .   Pneumonia   . Rhabdomyolysis   . 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 Stroke (HCC)    L patietal with small scattered lacunar infarcts  . Thrombocytopenia (HCC) 03/21/2015  . Venous insufficiency   . Weakness generalized 03/21/2015    ALLERGIES:  is allergic to actos [pioglitazone], metformin and related, and niaspan [niacin er].  MEDICATIONS:   Current Outpatient Medications  Medication Sig Dispense Refill  . albuterol (VENTOLIN HFA) 108 (90 Base) MCG/ACT inhaler Inhale 2 puffs into the lungs every 6 (six) hours as needed for wheezing or shortness of breath. 8 g 6  . allopurinol (ZYLOPRIM) 100 MG tablet Take 1 tablet (100 mg total) by mouth daily. 30 tablet 6  . amitriptyline (ELAVIL) 75 MG tablet Take 75 mg by mouth at bedtime.    . apixaban (ELIQUIS) 2.5 MG TABS tablet Take 1 tablet (2.5 mg total) by mouth 2 (two) times daily. 180 tablet 3  . Blood Glucose Monitoring Suppl (CONTOUR NEXT MONITOR) w/Device KIT 1 Device by Does not apply route 3 (three) times daily. Use to check blood sugars 3 times daily. Dx Code E13.9 1 kit 2  . CONTOUR NEXT TEST test strip USE 1 STRIP TO CHECK GLUCOSE 4 TIMES DAILY 300 each 1  . donepezil (ARICEPT) 10 MG tablet Take 1 tablet (10 mg total) by mouth daily. 90 tablet 4  . folic acid (FOLVITE) 1 MG tablet Take 1 tablet (1 mg total) by mouth daily. 30 tablet 4  . furosemide (LASIX) 40 MG tablet Take 1 tablet (40 mg total) by mouth in the morning. 30 tablet 6  . insulin degludec (TRESIBA FLEXTOUCH) 200 UNIT/ML FlexTouch Pen Inject 24 Units into the skin at bedtime. (Patient taking differently: Inject 20 Units into the skin at bedtime. 24 units on Chemo days)    . insulin lispro (HUMALOG) 100 UNIT/ML cartridge Inject 5-15 Units into the skin 3 (three) times daily with meals. Sliding scale.    . Insulin Pen Needle (EASY COMFORT PEN NEEDLES) 33G X 4 MM MISC 1 each by Does not apply route See admin instructions. Use to inject insulin 5 times daily. 150 each 3  . levothyroxine (SYNTHROID, LEVOTHROID) 75 MCG tablet Take 75 mcg by mouth daily before breakfast.   1  . lidocaine (XYLOCAINE) 2 % injection See admin instructions.    . lidocaine (XYLOCAINE) 2 % solution Use as directed 15 mLs in the mouth or throat as needed for mouth pain. 100 mL 0  . lidocaine-prilocaine (EMLA) cream Apply 1 application topically as  needed. Apply a teaspoon over port site at least 1 hour prior to lab appt. Do not rub in and cover with plastic wrap. 30 g 0  . lisinopril (ZESTRIL) 10 MG tablet TAKE ONE TABLET DAILY (Patient taking differently: Take 10 mg by mouth daily.) 90 tablet 2  . metFORMIN (GLUCOPHAGE-XR) 750 MG 24 hr tablet TAKE 1 TABLET WITH DINNER 90 tablet 0  . metoprolol succinate (TOPROL-XL) 25 MG 24 hr tablet Take 1 tablet (25 mg total) by mouth daily. 90 tablet 3  . Nutritional Supplements (ENSURE MAX PROTEIN PO) Take by mouth.    . ondansetron (ZOFRAN) 4 MG tablet Take 1 tablet (4 mg total) by mouth every 6 (six) hours as needed for nausea. 20 tablet 0  . OXYGEN Inhale 2 L into the lungs. At bedtime and during the day prn    . potassium chloride SA (K-DUR,KLOR-CON)   20 MEQ tablet Take 20 mEq by mouth 2 (two) times daily.     . prochlorperazine (COMPAZINE) 10 MG tablet Take 1 tablet (10 mg total) by mouth every 6 (six) hours as needed for nausea or vomiting. 30 tablet 0  . rosuvastatin (CRESTOR) 20 MG tablet Take 20 mg by mouth daily.    . sotorasib (LUMAKRAS) 120 MG TABS Take 960 mg by mouth every morning. 240 tablet 3  . Tamsulosin HCl (FLOMAX) 0.4 MG CAPS Take 0.4 mg by mouth daily.     . vitamin B-12 (CYANOCOBALAMIN) 1000 MCG tablet Take 1,000 mcg by mouth daily.     Current Facility-Administered Medications  Medication Dose Route Frequency Provider Last Rate Last Admin  . methylPREDNISolone acetate (DEPO-MEDROL) injection 80 mg  80 mg Other Once Magnus Sinning, MD        SURGICAL HISTORY:  Past Surgical History:  Procedure Laterality Date  . BACK SURGERY  2002   lumbosacral. 11 back surgeries total  . BRONCHIAL BIOPSY  08/06/2020   Procedure: BRONCHIAL BIOPSIES;  Surgeon: Collene Gobble, MD;  Location: [facility] ENDOSCOPY;  Service: Pulmonary;;  . BRONCHIAL BRUSHINGS  08/06/2020   Procedure: BRONCHIAL BRUSHINGS;  Surgeon: Collene Gobble, MD;  Location: [facility] ENDOSCOPY;  Service: Pulmonary;;  . BRONCHIAL  NEEDLE ASPIRATION BIOPSY  08/06/2020   Procedure: BRONCHIAL NEEDLE ASPIRATION BIOPSIES;  Surgeon: Collene Gobble, MD;  Location: [facility] ENDOSCOPY;  Service: Pulmonary;;  . Carotid Doppler  03/2013   bilat bulb/prox ICAs - mild amount of fibrous plaque with no evidence of diameter reduction  . CARPAL TUNNEL RELEASE Bilateral 08/09/2014   Procedure: BILATERAL CARPAL TUNNEL RELEASE;  Surgeon: Daryll Brod, MD;  Location: [place];  Service: Orthopedics;  Laterality: Bilateral;  ANESTHESIA:  IV REGIONAL BIL FAB  . CHOLECYSTECTOMY    . COLONOSCOPY    . CORONARY ANGIOPLASTY  10/13/1996  . CORONARY ANGIOPLASTY  09/21/1989   emergency PTCA  . CORONARY ANGIOPLASTY  10/13/1996   Multi-Link diagonal & OD stenting (Dr. Marella Chimes)  . CORONARY ANGIOPLASTY  12/03/1997   disease of mid DX-1 ~50% & in mid PLA & PDA (distal lesions) (Dr. Marella Chimes)   . CORONARY ANGIOPLASTY  10/14/1999   progression of disease distal PLA & PDA; progression of disease prox RCA - moderate (Dr. Marella Chimes)   . CORONARY ANGIOPLASTY WITH STENT PLACEMENT  04/04/2004   4.0x61m non-DES (thrombectomy via AngioJet) to RCA for high grade stenosis (Dr. RMarella Chimes  . CORONARY ARTERY BYPASS GRAFT N/A 03/29/2015   Procedure: CORONARY ARTERY BYPASS GRAFTING TIMES TWO USING LEFT INTERNAL MAMMARY ARTERY AND RIGHT LEG GREATER SAPHENOUS VEIN HARVESTED ENDOSCOPICALLY.;  Surgeon: CRexene Alberts MD;  [place] [place]  Service: Open [facility] Surgery;  Laterality: N/A;  . ESOPHAGOGASTRODUODENOSCOPY N/A 03/27/2015   Procedure: ESOPHAGOGASTRODUODENOSCOPY (EGD);  Surgeon: MClarene [facility] MD;  [facility];  Service: Endoscopy;  Laterality: N/A;  possible dilation  . ESOPHAGOGASTRODUODENOSCOPY N/A 10/05/2020   Procedure: ESOPHAGOGASTRODUODENOSCOPY (EGD);  Surgeon: OArta Silence MD;  [facility];  Service: Endoscopy;  Laterality: N/A;  . FINE NEEDLE ASPIRATION  08/06/2020   Procedure: FINE NEEDLE ASPIRATION (FNA) LINEAR;   Surgeon: BCollene Gobble MD;  [facility];  Service: Pulmonary;;  . IR IMAGING GUIDED PORT INSERTION  10/07/2020  . LEFT HEART CATHETERIZATION WITH CORONARY ANGIOGRAM N/A 03/28/2015   Procedure: LEFT HEART CATHETERIZATION WITH CORONARY ANGIOGRAM;  Surgeon: Peter M JMartinique MD;  [facility];  Service: Cardiovascular;  Laterality: N/A;  .  SINUS ENDO W/FUSION    . TONSILLECTOMY    . TRANSTHORACIC ECHOCARDIOGRAM  08/08/2013   EF 55-60%, mild conc hypertrophy, grade 1 diastolic dysfunction; AV with mild stenosis; LA & RA mildly dilated  . VIDEO BRONCHOSCOPY WITH ENDOBRONCHIAL NAVIGATION N/A 08/06/2020   Procedure: VIDEO BRONCHOSCOPY WITH ENDOBRONCHIAL NAVIGATION;  Surgeon: Collene Gobble, MD;  Location: [facility] ENDOSCOPY;  Service: Pulmonary;  Laterality: N/A;  . VIDEO BRONCHOSCOPY WITH ENDOBRONCHIAL ULTRASOUND N/A 08/06/2020   Procedure: VIDEO BRONCHOSCOPY WITH ENDOBRONCHIAL ULTRASOUND;  Surgeon: Collene Gobble, MD;  Location: [facility] ENDOSCOPY;  Service: Pulmonary;  Laterality: N/A;    REVIEW OF SYSTEMS:   Review of Systems  Constitutional: Negative for appetite change, chills, fatigue, fever and unexpected weight change.  HENT:   Negative for mouth sores, nosebleeds, sore throat and trouble swallowing.   Eyes: Negative for eye problems and icterus.  Respiratory: Positive for mild dyspnea. Negative for cough, hemoptysis,  and wheezing.   Cardiovascular: Positive for baseline bilateral lower extremity swelling. Negative for chest pain.  Gastrointestinal: Negative for abdominal pain, constipation, diarrhea, nausea and vomiting.  Genitourinary: Negative for bladder incontinence, difficulty urinating, dysuria, frequency and hematuria.   Musculoskeletal: Negative for back pain, gait problem, neck pain and neck stiffness.  Skin: Negative for itching and rash.  Neurological: Negative for dizziness, extremity weakness, gait problem, headaches, light-headedness and seizures.  Hematological:  Negative for adenopathy. Positive for senile purpura.  Psychiatric/Behavioral: Negative for confusion, depression and sleep disturbance. The patient is not nervous/anxious.     PHYSICAL EXAMINATION:  Blood pressure (!) 131/54, pulse 87, temperature 97.7 F (36.5 C), temperature source Tympanic, resp. rate 16, height 5' 7" (1.702 m), weight 234 lb 9.6 oz (106.4 kg), SpO2 92 %.  ECOG PERFORMANCE STATUS: 2  Physical Exam  Constitutional: Oriented to person, place, and time and well-developed, well-nourished, and in no distress.  HENT:  Head: Normocephalic and atraumatic.  Mouth/Throat: Oropharynx is clear and moist. No oropharyngeal exudate.  Eyes: Conjunctivae are normal. Right eye exhibits no discharge. Left eye exhibits no discharge. No scleral icterus.  Neck: Normal range of motion. Neck supple.  Cardiovascular: Normal rate, regular rhythm,systolic murmur notedand intact distal pulses.  Pulmonary/Chest: Effort normal. Decreased breath sounds at the base of both lungs.No respiratory distress. No wheezes. No rales.  Abdominal: Soft. Bowel sounds are normal. Exhibits no distension and no mass. There is no tenderness.  Musculoskeletal: Bilateral lower extremity swelling.Normal range of motion.  Lymphadenopathy:  No cervical adenopathy.  Neurological: Alert and oriented to person, place, and time. Exhibits normal muscle tone. Examined in the wheelchair. Skin: Skin is warm and dry. No rash noted. Not diaphoretic. No erythema. No pallor.  Psychiatric: Mood, memory and judgment normal.  Vitals reviewed.  LABORATORY DATA: Lab Results  Component Value Date   WBC 5.1 01/13/2021   HGB 10.5 (L) 01/13/2021   HCT 33.4 (L) 01/13/2021   MCV 93.0 01/13/2021   PLT 90 (L) 01/13/2021      Chemistry      Component Value Date/Time   NA 142 01/13/2021 0940   K 3.8 01/13/2021 0940   CL 105 01/13/2021 0940   CO2 30 01/13/2021 0940   BUN 26 (H) 01/13/2021 0940   CREATININE 1.49 (H)  01/13/2021 0940   CREATININE 1.30 (H) 04/30/2020 1044      Component Value Date/Time   CALCIUM 9.1 01/13/2021 0940   ALKPHOS 87 01/13/2021 0940   AST 15 01/13/2021 0940   ALT 10 01/13/2021 0940   BILITOT 0.5  01/13/2021 0940       RADIOGRAPHIC STUDIES:  CT Chest W Contrast  Result Date: 01/10/2021 CLINICAL DATA:  Restaging non-small cell lung cancer, chemotherapy in progress. EXAM: CT CHEST, ABDOMEN, AND PELVIS WITH CONTRAST TECHNIQUE: Multidetector CT imaging of the chest, abdomen and pelvis was performed following the standard protocol during bolus administration of intravenous contrast. CONTRAST:  80mL OMNIPAQUE IOHEXOL 300 MG/ML  SOLN COMPARISON:  Multiple exams, including 11/08/2020 FINDINGS: CT CHEST FINDINGS Cardiovascular: Right Port-A-Cath tip: Right atrium. Coronary, aortic arch, and branch vessel atherosclerotic vascular disease. Prior CABG. Mild cardiomegaly. Suspected flow related phenomenon in the right lower lobe pulmonary arteries which demonstrate higher density, potentially related to vascular shunting around the right-sided tumor or slower drainage of this region. The appearance is not characteristic for pulmonary embolus. A similar appearance was present on 11/08/2020. Aortic valve calcifications noted. Mediastinum/Nodes: Hypodense 1.6 cm right thyroid nodule in the setting of other thyroid nodules, previously evaluated on prior thyroid ultrasound of 2013. This has been evaluated on previous imaging. (ref: J Am Coll Radiol. 2015 Feb;12(2): 143-50).Right paratracheal node 1.1 cm in short axis on image 25 series 2, stable. Lungs/Pleura: Trace left pleural effusion reduced in size compared to prior. Small to moderate right pleural effusion appear stable. Mildly increased airspace opacity around the periphery of the posterior right upper lobe mass. Small amount of central cavitation. The mass itself is difficult to measure separate from the surrounding airspace opacity making  measurements less reliable, but subjectively appears grossly similar to prior. The consolidation in the right lower lobe likewise appears similar to prior. Similarly the left upper lobe mass is obscured by surrounding airspace opacity and accordingly difficult to measure, but the degree of surrounding airspace opacity is increased from previous. This could relate to postobstructive pneumonitis but could also be from radiation pneumonitis, correlate with radiation therapy history. There is some increased nodular opacity in the left lower lobe including a pleural-based 2.2 by 1.7 cm nodule on image 109 of series 6, previously about 1.3 by 1.1 cm. Local consolidation with possible associated nodule medially in the left lower lobe measures 4.4 by 2.2 cm on image 95 of series 6, previously 2.4 by 1.3 cm. Additional nodularity in the posterior basal segment left lower lobe noted, new from previous. Musculoskeletal: Thoracic spondylosis. Lower cervical plate and screw fixator. Probable sebaceous cyst in the subcutaneous tissues posterior to the left shoulder on image 9 of series 2, stable. CT ABDOMEN PELVIS FINDINGS Hepatobiliary: Cholecystectomy.  No discrete liver mass identified. Pancreas: Unremarkable Spleen: Unremarkable Adrenals/Urinary Tract: Both adrenal glands appear normal. Right kidney lower pole cyst. Enhancing 2.0 cm left mid kidney mass on image 92 of series 4 and image 25 of series 7 common no change from prior. Stomach/Bowel: Unremarkable Vascular/Lymphatic: Aortoiliac atherosclerotic vascular disease. Reproductive: Mild prostatomegaly. Other: No supplemental non-categorized findings. Musculoskeletal: Stable soft tissue mass along the left gluteal musculature posterior to the sacrum with speckled associated calcifications, smaller lesion on the right. No bony destructive findings. Mild calcification in the hamstring tendons. Levoconvex lumbar scoliosis with rotary component. Bridging spurring of the left  sacroiliac joint. IMPRESSION: 1. The upper lobe lung mass is are roughly similar in size but have an increased degree of surrounding consolidation compared to previous. Some of this could certainly be from radiation pneumonitis. Adjacent loculated pleural fluid is also noted. However, there is some increase in nodularity in the left lower lobe and progressive metastatic disease to the left lower lobe is not excluded. Infectious nodularity in   the left lower lobe is a differential diagnostic consideration. 2. Stable consolidation in the right lower lobe. 3. Stable small to moderate right pleural effusion. Trace left pleural effusion reduced in size compared to prior. 4. Stable 2.0 cm enhancing left mid kidney mass, suspicious for renal cell carcinoma. 5. Stable soft tissue mass along the left gluteal musculature posterior to the sacrum with speckled associated calcifications. This could be an unusual region of fat necrosis but merit surveillance. 6. Other imaging findings of potential clinical significance: Coronary, aortic arch, and branch vessel atherosclerotic vascular disease. Mild cardiomegaly. Prior CABG. Aortic valve calcifications. Mild prostatomegaly. Levoconvex lumbar scoliosis with rotary component. Bridging spurring of the left sacroiliac joint. 7. Aortic atherosclerosis. Aortic Atherosclerosis (ICD10-I70.0). Electronically Signed   By: Walter  Liebkemann M.D.   On: 01/10/2021 18:44   CT Abdomen Pelvis W Contrast  Result Date: 01/10/2021 CLINICAL DATA:  Restaging non-small cell lung cancer, chemotherapy in progress. EXAM: CT CHEST, ABDOMEN, AND PELVIS WITH CONTRAST TECHNIQUE: Multidetector CT imaging of the chest, abdomen and pelvis was performed following the standard protocol during bolus administration of intravenous contrast. CONTRAST:  80mL OMNIPAQUE IOHEXOL 300 MG/ML  SOLN COMPARISON:  Multiple exams, including 11/08/2020 FINDINGS: CT CHEST FINDINGS Cardiovascular: Right Port-A-Cath tip: Right  atrium. Coronary, aortic arch, and branch vessel atherosclerotic vascular disease. Prior CABG. Mild cardiomegaly. Suspected flow related phenomenon in the right lower lobe pulmonary arteries which demonstrate higher density, potentially related to vascular shunting around the right-sided tumor or slower drainage of this region. The appearance is not characteristic for pulmonary embolus. A similar appearance was present on 11/08/2020. Aortic valve calcifications noted. Mediastinum/Nodes: Hypodense 1.6 cm right thyroid nodule in the setting of other thyroid nodules, previously evaluated on prior thyroid ultrasound of 2013. This has been evaluated on previous imaging. (ref: J Am Coll Radiol. 2015 Feb;12(2): 143-50).Right paratracheal node 1.1 cm in short axis on image 25 series 2, stable. Lungs/Pleura: Trace left pleural effusion reduced in size compared to prior. Small to moderate right pleural effusion appear stable. Mildly increased airspace opacity around the periphery of the posterior right upper lobe mass. Small amount of central cavitation. The mass itself is difficult to measure separate from the surrounding airspace opacity making measurements less reliable, but subjectively appears grossly similar to prior. The consolidation in the right lower lobe likewise appears similar to prior. Similarly the left upper lobe mass is obscured by surrounding airspace opacity and accordingly difficult to measure, but the degree of surrounding airspace opacity is increased from previous. This could relate to postobstructive pneumonitis but could also be from radiation pneumonitis, correlate with radiation therapy history. There is some increased nodular opacity in the left lower lobe including a pleural-based 2.2 by 1.7 cm nodule on image 109 of series 6, previously about 1.3 by 1.1 cm. Local consolidation with possible associated nodule medially in the left lower lobe measures 4.4 by 2.2 cm on image 95 of series 6,  previously 2.4 by 1.3 cm. Additional nodularity in the posterior basal segment left lower lobe noted, new from previous. Musculoskeletal: Thoracic spondylosis. Lower cervical plate and screw fixator. Probable sebaceous cyst in the subcutaneous tissues posterior to the left shoulder on image 9 of series 2, stable. CT ABDOMEN PELVIS FINDINGS Hepatobiliary: Cholecystectomy.  No discrete liver mass identified. Pancreas: Unremarkable Spleen: Unremarkable Adrenals/Urinary Tract: Both adrenal glands appear normal. Right kidney lower pole cyst. Enhancing 2.0 cm left mid kidney mass on image 92 of series 4 and image 25 of series 7 common no change   from prior. Stomach/Bowel: Unremarkable Vascular/Lymphatic: Aortoiliac atherosclerotic vascular disease. Reproductive: Mild prostatomegaly. Other: No supplemental non-categorized findings. Musculoskeletal: Stable soft tissue mass along the left gluteal musculature posterior to the sacrum with speckled associated calcifications, smaller lesion on the right. No bony destructive findings. Mild calcification in the hamstring tendons. Levoconvex lumbar scoliosis with rotary component. Bridging spurring of the left sacroiliac joint. IMPRESSION: 1. The upper lobe lung mass is are roughly similar in size but have an increased degree of surrounding consolidation compared to previous. Some of this could certainly be from radiation pneumonitis. Adjacent loculated pleural fluid is also noted. However, there is some increase in nodularity in the left lower lobe and progressive metastatic disease to the left lower lobe is not excluded. Infectious nodularity in the left lower lobe is a differential diagnostic consideration. 2. Stable consolidation in the right lower lobe. 3. Stable small to moderate right pleural effusion. Trace left pleural effusion reduced in size compared to prior. 4. Stable 2.0 cm enhancing left mid kidney mass, suspicious for renal cell carcinoma. 5. Stable soft tissue mass  along the left gluteal musculature posterior to the sacrum with speckled associated calcifications. This could be an unusual region of fat necrosis but merit surveillance. 6. Other imaging findings of potential clinical significance: Coronary, aortic arch, and branch vessel atherosclerotic vascular disease. Mild cardiomegaly. Prior CABG. Aortic valve calcifications. Mild prostatomegaly. Levoconvex lumbar scoliosis with rotary component. Bridging spurring of the left sacroiliac joint. 7. Aortic atherosclerosis. Aortic Atherosclerosis (ICD10-I70.0). Electronically Signed   By: Van Clines M.D.   On: 01/10/2021 18:44   XR C-ARM NO REPORT  Result Date: 12/25/2020 Please see Notes tab for imaging impression.    ASSESSMENT/PLAN:  This is a very pleasant 80 year old Caucasian male diagnosed with stage IV (T4,N2,M1A) non-small cell lung cancer, mucinous adenocarcinoma. The patient presented with bilateral large masses in the left upper lobe and the right upper lobe as well as other pulmonary nodules in the right upper lobe with hilar and mediastinal lymphadenopathy. The patient was diagnosed in August 2021. The patient's PD-L1 expression is 20%. The patient's molecular studies show that he is positive for KRAS G12 C mutation which may be a targetable mutation in the second line setting in the future.  The patient is currently undergoing palliative systemic chemotherapy with carboplatin for an AUC of 5, Alimta 500 mg per metered squared, Keytruda 200 mg IV every 3 weeks. He is status post 6 cycles. Starting from cycle #3, Alimta was held due to elevated creatinine. The patient's carboplatin was also reduced to an AUC of 4. Starting from cycle 4, the patient has been on maintenance single agent immunotherapy with Keytruda.   The patient recently had a restaging CT scan performed. Dr. Julien Nordmann personally and independently reviewed the scan and discussed the results with the patient today. The scan showed  that while the upper lobe lung mass was roughly the same size, there was increased surrounding consolidation and increased nodularity in the left lower lobe. The patient denies history of radiation. He denies any signs or symptoms of infection. Dr. Julien Nordmann is concerned this is secondary to disease progression and recommends discontinuing his immunotherapy.   The patient has a targetable mutation (KRAS G12C) in the second line setting. Dr. Julien Nordmann recommends we switch his treated to targeted therapy with Sotorasib (Lumakras) 960 mg p.o. daily. He is interested in this option and is expected to start this in the next few days.   We will arrange for the patient to meet  with the pharmacy staff today to have formal teaching about this new treatment. Due to drug interactions, the patient was instructed to discontinue Protonix as well as ensure/glycena supplements.    We will see the patient back for follow-up visit in 3 weeks for evaluation and repeat blood work.   The patient's wife had questions about his amitriptyline which was prescribed by another provider for neuropathy. The patient's endocrinologist mentioned at a recent appointment that may be contributing to his fatigue. She is concerned about his fatigue and that he sleeps a lot. I directed her to the prescribing provider to see if this medication can be discontinued, continued, or switched to an alterative medication.   The patient was advised to call immediately if he has any concerning symptoms in the interval. The patient voices understanding of current disease status and treatment options and is in agreement with the current care plan. All questions were answered. The patient knows to call the clinic with any problems, questions or concerns. We can certainly see the patient much sooner if necessary     Orders Placed This Encounter  Procedures  . CBC with Differential (Cancer Center Only)    Standing Status:   Future    Standing  Expiration Date:   01/13/2022  . CMP (Cancer Center only)    Standing Status:   Future    Standing Expiration Date:   01/13/2022     I spent 20-29 minutes in this encounter.    L , PA-C 01/13/21  ADDENDUM: Hematology/Oncology Attending: I had a face-to-face encounter with the patient today. I reviewed his labs, scans and recommended his care plan. 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 He is status post induction systemic chemotherapy with carboplatin, Alimta and Keytruda for four cycles followed by maintenance treatment with single agent Keytruda for two more cycles. The patient has been tolerating this treatment well with no concerning complaints except for worsening dyspnea recently. He had repeat CT scan of the chest, abdomen pelvis performed recently. I personally and independently reviewed the scan images and discussed the results with the patient today. Unfortunately has a scan showed evidence for disease progression with worsening consolidation of the lung masses. I recommended for the patient to discontinue his current treatment with maintenance Keytruda at this point. I discussed with the patient second line treatment with targeted therapy with Lumakras (Sotorasib) because of the presence of the K-ras G12C mutation in his molecular studies. The patient is interested in this treatment. He will start Lumakras (Sotorasib) at a dose of 960 mg p.o. daily. I discussed with the patient the adverse effect of this treatment and he will meet with the pharmacist for oral oncolytic for more discussion of this treatment as well as the adverse effects and precautions and also to help the patient obtaining his medications. I will see the patient back for follow-up visit in 3 weeks for evaluation and management of any adverse effect of his  treatment. He was advised to call immediately if he has any other concerning symptoms in the interval.  Disclaimer: This note was dictated with voice recognition software. Similar sounding words can inadvertently be transcribed and may be missed upon review. Mohamed K Mohamed, MD 01/13/21  

## 2021-01-09 NOTE — Patient Instructions (Addendum)
Tresiba 18 units, reduce if am sugar <90 go to 16.  More sugars at bedtime  Check blood sugars on waking up 7 days a week  Also check blood sugars about 2 hours after meals and do this after different meals by rotation  Recommended blood sugar levels on waking up are 90-130 and about 2 hours after meal is 130-180  Please bring your blood sugar monitor to each visit, thank you

## 2021-01-09 NOTE — Progress Notes (Signed)
Patient ID: Randall Wilkins, male   DOB: November 30, 1941, 80 y.o.   MRN: [id number]           Reason for Appointment: Follow-up for Type 2 Diabetes   History of Present Illness:          Date of diagnosis of type 2 diabetes mellitus:   1998       Background history:   Has been on insulin since about 2001 He thinks he was given metformin only for a month and may have stopped because of renal dysfunction Also Actos was tried and not clear what side effects he had Does not remember other medication he may have tried but has not tried Victoza or Byetta   Recent history:    INSULIN regimen is:   [place] 20 units at 8 PM .  Humalog 2 to 7 units before meals  Non-insulin hypoglycemic drugs the patient is taking are:  Metformin 750 mg daily  His A1c is 6.4 compared to 6.7  Current management, blood sugar patterns and problems identified:   His wife is adjusting his insulin doses and helping with his glucose monitoring  His blood sugars are still generally well controlled with relatively lower doses of insulin and no Victoza  Blood sugar monitoring is done mostly with the freestyle libre which appears to be accurate although not clear if it is reading falsely low occasionally overnight  His main meal is likely at dinnertime but he does not check readings after eating  Since his morning meal is relatively small and he has some juice his wife gives him only about 2 units usually of the Humalog but this appears to be sufficient  Usually will give him about 7 units at lunch and dinner based on his sugar level  She did go up slightly about 4 units his sugars were higher after an epidural steroid but this did not control his blood sugar which had gone up 307  Current blood sugar patterns as follows   Blood sugars are low normal or low sugar diet and gradually increasing between 8 AM until 3 PM  Has only occasional postprandial spikes after lunch and some rebound from low sugars  No data  available about blood sugars after about 6 PM  Hypoglycemia none documented at least on 2 nights; since active CGM time is only 60% his low blood sugar is time in the low sugar ranges up to 9%    Glucose monitoring:  Checking blood sugars 3-4 times a day       Glucometer:  Libre/contour   Data for the last 2 weeks from freestyle libre version 2 download  CGM use % of time  60  2-week average/GV  112/31  Time in range     87   %  % Time Above 180 4  % Time above 250   % Time Below 70 9     PRE-MEAL Fasting Lunch Dinner  12-2 AM Overall  Glucose range:       Averages:  90  118   80    POST-MEAL PC Breakfast PC Lunch PC Dinner  Glucose range:     Averages:  95  150 ?  145    PREVIOUS data:  CGM use % of time  45  2-week average/SD  129, GV 29  Time in range       89%  % Time Above 180  10  % Time above 250   % Time Below 70  1   Glucose ranges are from fingerstick and averages from freestyle libre   PRE-MEAL Fasting Lunch Dinner Bedtime Overall  Glucose range:  51-310  57-349  61-140    Averages:  115  145  114  140    POST-MEAL PC Breakfast PC Lunch PC Dinner  Glucose range:    68-327  Averages:         Self-care:  Meal times are:  Breakfast is at 8 a.m., lunch: 12 noon Dinner: 5-6 PM   Typical meal intake: Breakfast is sometimes doughnuts or sometimes egg sandwich   lunch is a sandwich or cottage cheese/food, dinner is meat and vegetables/solid    Snacks: Sugar-free Jell-O or pudding, crackers          Dietician visit, most recent: 12/2017               CDE 8/18  Exercise:  unable to do any, using walker for ambulation  Weight history: Range 252-280 previously  Wt Readings from Last 3 Encounters:  01/09/21 234 lb 6.4 oz (106.3 kg)  12/24/20 238 lb 11.2 oz (108.3 kg)  12/23/20 237 lb (107.5 kg)    Glycemic control:   Lab Results  Component Value Date   HGBA1C 6.4 01/06/2021   HGBA1C 6.7 (H) 10/06/2020   HGBA1C 6.4 05/20/2020   Lab Results   Component Value Date   MICROALBUR 35.5 (H) 02/12/2020   LDLCALC 36 01/06/2021   CREATININE 1.43 01/06/2021   Lab Results  Component Value Date   MICRALBCREAT 52.7 (H) 02/12/2020    Lab Results  Component Value Date   FRUCTOSAMINE 252 12/17/2017   FRUCTOSAMINE 233 08/09/2017      Allergies as of 01/09/2021      Reactions   Actos [pioglitazone] Swelling   Metformin And Related Nausea Only   Niaspan [niacin Er] Itching, Rash      Medication List       Accurate as of January 09, 2021  1:28 PM. If you have any questions, ask your nurse or doctor.        albuterol 108 (90 Base) MCG/ACT inhaler Commonly known as: VENTOLIN HFA Inhale 2 puffs into the lungs every 6 (six) hours as needed for wheezing or shortness of breath.   allopurinol 100 MG tablet Commonly known as: ZYLOPRIM Take 1 tablet (100 mg total) by mouth daily.   amitriptyline 50 MG tablet Commonly known as: ELAVIL Take 75 mg by mouth at bedtime.   amitriptyline 75 MG tablet Commonly known as: ELAVIL Take 75 mg by mouth at bedtime.   apixaban 2.5 MG Tabs tablet Commonly known as: ELIQUIS Take 1 tablet (2.5 mg total) by mouth 2 (two) times daily.   Contour Next Monitor w/Device Kit 1 Device by Does not apply route 3 (three) times daily. Use to check blood sugars 3 times daily. Dx Code E13.9   Contour Next Test test strip Generic drug: glucose blood USE 1 STRIP TO CHECK GLUCOSE 4 TIMES DAILY   donepezil 10 MG tablet Commonly known as: ARICEPT Take 1 tablet (10 mg total) by mouth daily.   Easy Comfort Pen Needles 33G X 4 MM Misc Generic drug: Insulin Pen Needle 1 each by Does not apply route See admin instructions. Use to inject insulin 5 times daily.   ENSURE PO Take by mouth as needed.   ENSURE MAX PROTEIN PO Take by mouth.   feeding supplement (GLUCERNA SHAKE) Liqd Take 237 mLs by mouth 3 (three) times daily between meals.  folic acid 1 MG tablet Commonly known as: FOLVITE Take 1  tablet (1 mg total) by mouth daily.   furosemide 40 MG tablet Commonly known as: LASIX Take 1 tablet (40 mg total) by mouth in the morning.   HumaLOG 100 UNIT/ML cartridge Generic drug: insulin lispro Inject 5-15 Units into the skin 3 (three) times daily with meals. Sliding scale.   levothyroxine 75 MCG tablet Commonly known as: SYNTHROID Take 75 mcg by mouth daily before breakfast.   lidocaine 2 % injection Commonly known as: XYLOCAINE See admin instructions.   lidocaine 2 % solution Commonly known as: XYLOCAINE Use as directed 15 mLs in the mouth or throat as needed for mouth pain.   lidocaine-prilocaine cream Commonly known as: EMLA Apply 1 application topically as needed. Apply a teaspoon over port site at least 1 hour prior to lab appt. Do not rub in and cover with plastic wrap.   lisinopril 10 MG tablet Commonly known as: ZESTRIL TAKE ONE TABLET DAILY   metFORMIN 750 MG 24 hr tablet Commonly known as: GLUCOPHAGE-XR TAKE 1 TABLET WITH DINNER   metoprolol succinate 25 MG 24 hr tablet Commonly known as: TOPROL-XL Take 1 tablet (25 mg total) by mouth daily.   ondansetron 4 MG tablet Commonly known as: ZOFRAN Take 1 tablet (4 mg total) by mouth every 6 (six) hours as needed for nausea.   OXYGEN Inhale 2 L into the lungs. At bedtime and during the day prn   pantoprazole 40 MG tablet Commonly known as: PROTONIX Take 1 tablet (40 mg total) by mouth 2 (two) times daily before a meal.   potassium chloride SA 20 MEQ tablet Commonly known as: KLOR-CON Take 20 mEq by mouth 2 (two) times daily.   prochlorperazine 10 MG tablet Commonly known as: COMPAZINE Take 1 tablet (10 mg total) by mouth every 6 (six) hours as needed for nausea or vomiting.   rosuvastatin 20 MG tablet Commonly known as: CRESTOR Take 20 mg by mouth daily.   tamsulosin 0.4 MG Caps capsule Commonly known as: FLOMAX Take 0.4 mg by mouth daily.   Tyler Aas FlexTouch 200 UNIT/ML FlexTouch  Pen Generic drug: insulin degludec Inject 24 Units into the skin at bedtime. What changed:   how much to take  additional instructions   vitamin B-12 1000 MCG tablet Commonly known as: CYANOCOBALAMIN Take 1,000 mcg by mouth daily.       Allergies:  Allergies  Allergen Reactions  . Actos [Pioglitazone] Swelling  . Metformin And Related Nausea Only  . Niaspan [Niacin Er] Itching and Rash    Past Medical History:  Diagnosis Date  . Aortic stenosis, mild 11/14/2013  . Aortic valve sclerosis 03/29/2015  . Bilateral leg edema 05/21/2014  . CAD (coronary artery disease)   . Chronic diastolic congestive heart failure ([place])   . Chronic kidney disease (CKD), stage III (moderate) (HCC)   . 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 Diabetic peripheral neuropathy associated with type 1 diabetes mellitus ([place]) 11/14/2013  . Dysphagia   . Dyspnea on exertion 03/21/2015  . Exogenous obesity   . Gout   . Heart attack ([place])   . History of nuclear stress test 07/2011   dipyridamole; fixed inferolateral defect, worse at stress than rest; no reversible ischemia; low risk scan   . Hyperlipidemia   . Hypertension   . Hypothyroidism   . Insulin dependent diabetes mellitus   . Left foot drop   . Left main coronary artery disease  03/28/2015  . Memory loss   . Obesity (BMI 30-39.9) 11/14/2013  . Obstructive sleep apnea 03/21/2015  . OSA on CPAP    uses a cpap  . Peptic ulcer with hemorrhage 03/28/2015  . Peripheral neuropathy   . Pneumonia   . Rhabdomyolysis   . 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 Stroke ([place])    L patietal with small scattered lacunar infarcts  . Thrombocytopenia ([place]) 03/21/2015  . Venous insufficiency   . Weakness generalized 03/21/2015    Past Surgical History:  Procedure Laterality Date  . BACK SURGERY  2002   lumbosacral. 11 back surgeries total  . BRONCHIAL BIOPSY  08/06/2020   Procedure: BRONCHIAL BIOPSIES;  Surgeon:  Collene Gobble, MD;  Location: [facility] ENDOSCOPY;  Service: Pulmonary;;  . BRONCHIAL BRUSHINGS  08/06/2020   Procedure: BRONCHIAL BRUSHINGS;  Surgeon: Collene Gobble, MD;  Location: [facility] ENDOSCOPY;  Service: Pulmonary;;  . BRONCHIAL NEEDLE ASPIRATION BIOPSY  08/06/2020   Procedure: BRONCHIAL NEEDLE ASPIRATION BIOPSIES;  Surgeon: Collene Gobble, MD;  Location: [facility] ENDOSCOPY;  Service: Pulmonary;;  . Carotid Doppler  03/2013   bilat bulb/prox ICAs - mild amount of fibrous plaque with no evidence of diameter reduction  . CARPAL TUNNEL RELEASE Bilateral 08/09/2014   Procedure: BILATERAL CARPAL TUNNEL RELEASE;  Surgeon: Daryll Brod, MD;  Location: [place];  Service: Orthopedics;  Laterality: Bilateral;  ANESTHESIA:  IV REGIONAL BIL FAB  . CHOLECYSTECTOMY    . COLONOSCOPY    . CORONARY ANGIOPLASTY  10/13/1996  . CORONARY ANGIOPLASTY  09/21/1989   emergency PTCA  . CORONARY ANGIOPLASTY  10/13/1996   Multi-Link diagonal & OD stenting (Dr. Marella Chimes)  . CORONARY ANGIOPLASTY  12/03/1997   disease of mid DX-1 ~50% & in mid PLA & PDA (distal lesions) (Dr. Marella Chimes)   . CORONARY ANGIOPLASTY  10/14/1999   progression of disease distal PLA & PDA; progression of disease prox RCA - moderate (Dr. Marella Chimes)   . CORONARY ANGIOPLASTY WITH STENT PLACEMENT  04/04/2004   4.0x26m non-DES (thrombectomy via AngioJet) to RCA for high grade stenosis (Dr. RMarella Chimes  . CORONARY ARTERY BYPASS GRAFT N/A 03/29/2015   Procedure: CORONARY ARTERY BYPASS GRAFTING TIMES TWO USING LEFT INTERNAL MAMMARY ARTERY AND RIGHT LEG GREATER SAPHENOUS VEIN HARVESTED ENDOSCOPICALLY.;  Surgeon: CRexene Alberts MD;  [facility]  Service: Open Heart Surgery;  Laterality: N/A;  . ESOPHAGOGASTRODUODENOSCOPY N/A 03/27/2015   Procedure: ESOPHAGOGASTRODUODENOSCOPY (EGD);  Surgeon: MClarene Essex MD;  [facility];  Service: Endoscopy;  Laterality: N/A;  possible dilation  . ESOPHAGOGASTRODUODENOSCOPY N/A 10/05/2020    Procedure: ESOPHAGOGASTRODUODENOSCOPY (EGD);  Surgeon: OArta Silence MD;  [facility];  Service: Endoscopy;  Laterality: N/A;  . FINE NEEDLE ASPIRATION  08/06/2020   Procedure: FINE NEEDLE ASPIRATION (FNA) LINEAR;  Surgeon: BCollene Gobble MD;  [facility];  Service: Pulmonary;;  . IR IMAGING GUIDED PORT INSERTION  10/07/2020  . LEFT HEART CATHETERIZATION WITH CORONARY ANGIOGRAM N/A 03/28/2015   Procedure: LEFT HEART CATHETERIZATION WITH CORONARY ANGIOGRAM;  Surgeon: Peter M JMartinique MD;  [facility];  Service: Cardiovascular;  Laterality: N/A;  . SINUS ENDO W/FUSION    . TONSILLECTOMY    . TRANSTHORACIC ECHOCARDIOGRAM  08/08/2013   EF 55-60%, mild conc hypertrophy, grade 1 diastolic dysfunction; AV with mild stenosis; LA & RA mildly dilated  . VIDEO BRONCHOSCOPY WITH ENDOBRONCHIAL NAVIGATION N/A 08/06/2020  Procedure: VIDEO BRONCHOSCOPY WITH ENDOBRONCHIAL NAVIGATION;  Surgeon: Collene Gobble, MD;  [facility]COPY;  Service: Pulmonary;  Laterality: N/A;  . VIDEO BRONCHOSCOPY WITH ENDOBRONCHIAL ULTRASOUND N/A 08/06/2020   Procedure: VIDEO BRONCHOSCOPY WITH ENDOBRONCHIAL ULTRASOUND;  Surgeon: Collene Gobble, MD;  Location: [facility] ENDOSCOPY;  Service: Pulmonary;  Laterality: N/A;    Family History  Problem Relation Age of Onset  . Heart disease Mother   . Coronary artery disease Father   . Cancer Maternal Grandmother   . Heart Problems Maternal Grandfather   . Diabetes Son        borderline     Social History:  reports that he quit smoking about 46 years ago. His smoking use included cigarettes. He has a 40.00 pack-year smoking history. He has never used smokeless tobacco. He reports that he does not drink alcohol and does not use drugs.   Review of Systems   Lipid history: LDL controlled on 20 mg rosuvastatin, followed by PCP  Has history of CAD    Lab Results  Component Value Date   CHOL 94 01/06/2021   HDL 36.20 (L) 01/06/2021   LDLCALC  36 01/06/2021   TRIG 109.0 01/06/2021   CHOLHDL 3 01/06/2021           Hypertension: Mild and treated with 10 mg lisinopril has been followed by PCP  BP Readings from Last 3 Encounters:  01/09/21 134/84  12/25/20 124/67  12/24/20 (!) 128/52     CKD: His creatinine is somewhat variable but recently higher  His last microalbumin ratio was 53  Lab Results  Component Value Date   CREATININE 1.43 01/06/2021   CREATININE 1.56 (H) 12/24/2020   CREATININE 1.56 (H) 12/02/2020    Most recent eye exam was in 2/20, Dr. Gershon Crane and reportedly no retinopathy   Complications of diabetes: Peripheral neuropathy with sensory loss.  Dementia: Followed by neurologist and is on Aricept     Physical Examination:  BP 134/84   Pulse 61   Ht 5' 7" (1.702 m)   Wt 234 lb 6.4 oz (106.3 kg)   SpO2 90%   BMI 36.71 kg/m       ASSESSMENT:  Diabetes type 2, insulin-dependent with obesity and history of neuropathy  See history of present illness for detailed discussion of current diabetes management, blood sugar patterns from his meter and freestyle [employer] and problems identified  His A1c is 6.4 He is on basal bolus insulin regimen, Metformin 750 mg  Blood sugars are excellent now with requiring lower doses of insulin since his weight had gone down Only occasionally when he gets steroids will his sugar go up He has some occasional hypoglycemia overnight as seen on his freestyle libre although not clear if this is consistently accurate His day-to-day management, blood sugar patterns and insulin adjustment was discussed with him and his wife  HYPERTENSION with microalbuminuria: Blood pressure is controlled with lisinopril 10 mg Creatinine 1.4 Last microalbumin ratio was 53, will need follow-up  Lipids: Well-controlled and will continue the same dose  PLAN:    We will reduce his Tyler Aas down to 18 to reduce chances of low sugars He can go down to 16 if waking up with  readings below 90 again We will need to have him check sugars after dinner to help adjust her suppertime dose but in the meantime can still take about 7 units Humalog coverage Stay on Metformin since renal function is adequate for this Since his fasting blood sugars in  the last few days have been low normal Blood sugar targets discussed Also periodically check fingersticks to compare with libre Continue to leave off Victoza Needs additional 2-3 units coverage for drinking Ensure with meals  If he gets any steroids especially epidural he will need to increase both the basal and bolus insulin doses by 10 units each instead of 4 units only    Patient Instructions  Tresiba 18 units, reduce if am sugar <90 go to 16.  More sugars at bedtime  Check blood sugars on waking up 7 days a week  Also check blood sugars about 2 hours after meals and do this after different meals by rotation  Recommended blood sugar levels on waking up are 90-130 and about 2 hours after meal is 130-180  Please bring your blood sugar monitor to each visit, thank you          Elayne Snare 01/09/2021, 1:28 PM   Note: This office note was prepared with Dragon voice recognition system technology. Any transcriptional errors that result from this process are unintentional.

## 2021-01-10 ENCOUNTER — Encounter (HOSPITAL_COMMUNITY): Payer: Self-pay

## 2021-01-10 ENCOUNTER — Ambulatory Visit (HOSPITAL_COMMUNITY)
Admission: RE | Admit: 2021-01-10 | Discharge: 2021-01-10 | Disposition: A | Discharge status: Home / Self Care | Payer: Medicare Other | Source: Ambulatory Visit | Attending: Internal Medicine | Admitting: Internal Medicine

## 2021-01-10 DIAGNOSIS — N2889 Other specified disorders of kidney and ureter: Secondary | ICD-10-CM | POA: Diagnosis not present

## 2021-01-10 DIAGNOSIS — N281 Cyst of kidney, acquired: Secondary | ICD-10-CM | POA: Diagnosis not present

## 2021-01-10 DIAGNOSIS — C3490 Malignant neoplasm of unspecified part of unspecified bronchus or lung: Secondary | ICD-10-CM | POA: Diagnosis not present

## 2021-01-10 DIAGNOSIS — M47814 Spondylosis without myelopathy or radiculopathy, thoracic region: Secondary | ICD-10-CM | POA: Diagnosis not present

## 2021-01-10 DIAGNOSIS — M4186 Other forms of scoliosis, lumbar region: Secondary | ICD-10-CM | POA: Diagnosis not present

## 2021-01-10 HISTORY — DX: Malignant (primary) neoplasm, unspecified: C80.1

## 2021-01-10 MED ORDER — IOHEXOL 300 MG/ML  SOLN
100.0000 mL | Freq: Once | INTRAMUSCULAR | Status: AC | PRN
  Administered 2021-01-10: 80 mL via INTRAVENOUS

## 2021-01-13 ENCOUNTER — Other Ambulatory Visit: Payer: Self-pay

## 2021-01-13 ENCOUNTER — Other Ambulatory Visit: Payer: Self-pay | Admitting: Internal Medicine

## 2021-01-13 ENCOUNTER — Inpatient Hospital Stay: Payer: Medicare Other

## 2021-01-13 ENCOUNTER — Telehealth: Payer: Self-pay

## 2021-01-13 ENCOUNTER — Telehealth: Payer: Self-pay | Admitting: Endocrinology

## 2021-01-13 ENCOUNTER — Inpatient Hospital Stay: Payer: Medicare Other | Admitting: Physician Assistant

## 2021-01-13 ENCOUNTER — Telehealth: Payer: Self-pay | Admitting: Pharmacist

## 2021-01-13 VITALS — BP 131/54 | HR 87 | Temp 97.7°F | Resp 16 | Ht 67.0 in | Wt 234.6 lb

## 2021-01-13 DIAGNOSIS — Z79899 Other long term (current) drug therapy: Secondary | ICD-10-CM | POA: Diagnosis not present

## 2021-01-13 DIAGNOSIS — N183 Chronic kidney disease, stage 3 unspecified: Secondary | ICD-10-CM | POA: Diagnosis not present

## 2021-01-13 DIAGNOSIS — T451X5A Adverse effect of antineoplastic and immunosuppressive drugs, initial encounter: Secondary | ICD-10-CM

## 2021-01-13 DIAGNOSIS — C3411 Malignant neoplasm of upper lobe, right bronchus or lung: Secondary | ICD-10-CM | POA: Diagnosis not present

## 2021-01-13 DIAGNOSIS — C3412 Malignant neoplasm of upper lobe, left bronchus or lung: Secondary | ICD-10-CM | POA: Diagnosis not present

## 2021-01-13 DIAGNOSIS — E1129 Type 2 diabetes mellitus with other diabetic kidney complication: Secondary | ICD-10-CM | POA: Diagnosis not present

## 2021-01-13 DIAGNOSIS — I130 Hypertensive heart and chronic kidney disease with heart failure and stage 1 through stage 4 chronic kidney disease, or unspecified chronic kidney disease: Secondary | ICD-10-CM | POA: Diagnosis not present

## 2021-01-13 DIAGNOSIS — C3490 Malignant neoplasm of unspecified part of unspecified bronchus or lung: Secondary | ICD-10-CM

## 2021-01-13 DIAGNOSIS — D6481 Anemia due to antineoplastic chemotherapy: Secondary | ICD-10-CM

## 2021-01-13 DIAGNOSIS — Z95828 Presence of other vascular implants and grafts: Secondary | ICD-10-CM

## 2021-01-13 DIAGNOSIS — E039 Hypothyroidism, unspecified: Secondary | ICD-10-CM | POA: Diagnosis not present

## 2021-01-13 DIAGNOSIS — C3491 Malignant neoplasm of unspecified part of right bronchus or lung: Secondary | ICD-10-CM | POA: Diagnosis not present

## 2021-01-13 DIAGNOSIS — Z7189 Other specified counseling: Secondary | ICD-10-CM | POA: Diagnosis not present

## 2021-01-13 DIAGNOSIS — Z5112 Encounter for antineoplastic immunotherapy: Secondary | ICD-10-CM | POA: Diagnosis not present

## 2021-01-13 DIAGNOSIS — I10 Essential (primary) hypertension: Secondary | ICD-10-CM | POA: Diagnosis not present

## 2021-01-13 DIAGNOSIS — I2510 Atherosclerotic heart disease of native coronary artery without angina pectoris: Secondary | ICD-10-CM | POA: Diagnosis not present

## 2021-01-13 DIAGNOSIS — E1122 Type 2 diabetes mellitus with diabetic chronic kidney disease: Secondary | ICD-10-CM | POA: Diagnosis not present

## 2021-01-13 DIAGNOSIS — I4891 Unspecified atrial fibrillation: Secondary | ICD-10-CM | POA: Diagnosis not present

## 2021-01-13 LAB — CMP (CANCER CENTER ONLY)
ALT: 10 U/L (ref 0–44)
AST: 15 U/L (ref 15–41)
Albumin: 3.2 g/dL — ABNORMAL LOW (ref 3.5–5.0)
Alkaline Phosphatase: 87 U/L (ref 38–126)
Anion gap: 7 (ref 5–15)
BUN: 26 mg/dL — ABNORMAL HIGH (ref 8–23)
CO2: 30 mmol/L (ref 22–32)
Calcium: 9.1 mg/dL (ref 8.9–10.3)
Chloride: 105 mmol/L (ref 98–111)
Creatinine: 1.49 mg/dL — ABNORMAL HIGH (ref 0.61–1.24)
GFR, Estimated: 47 mL/min — ABNORMAL LOW (ref >60)
Glucose, Bld: 133 mg/dL — ABNORMAL HIGH (ref 70–99)
Potassium: 3.8 mmol/L (ref 3.5–5.1)
Sodium: 142 mmol/L (ref 135–145)
Total Bilirubin: 0.5 mg/dL (ref 0.3–1.2)
Total Protein: 6.4 g/dL — ABNORMAL LOW (ref 6.5–8.1)

## 2021-01-13 LAB — CBC WITH DIFFERENTIAL (CANCER CENTER ONLY)
Abs Immature Granulocytes: 0 10*3/uL (ref 0.00–0.07)
Basophils Absolute: 0.1 10*3/uL (ref 0.0–0.1)
Basophils Relative: 1 %
Eosinophils Absolute: 0.1 10*3/uL (ref 0.0–0.5)
Eosinophils Relative: 1 %
HCT: 33.4 % — ABNORMAL LOW (ref 39.0–52.0)
Hemoglobin: 10.5 g/dL — ABNORMAL LOW (ref 13.0–17.0)
Immature Granulocytes: 0 %
Lymphocytes Relative: 21 %
Lymphs Abs: 1.1 10*3/uL (ref 0.7–4.0)
MCH: 29.2 pg (ref 26.0–34.0)
MCHC: 31.4 g/dL (ref 30.0–36.0)
MCV: 93 fL (ref 80.0–100.0)
Monocytes Absolute: 0.7 10*3/uL (ref 0.1–1.0)
Monocytes Relative: 13 %
Neutro Abs: 3.3 10*3/uL (ref 1.7–7.7)
Neutrophils Relative %: 64 %
Platelet Count: 90 10*3/uL — ABNORMAL LOW (ref 150–400)
RBC: 3.59 MIL/uL — ABNORMAL LOW (ref 4.22–5.81)
RDW: 15.3 % (ref 11.5–15.5)
WBC Count: 5.1 10*3/uL (ref 4.0–10.5)
nRBC: 0 % (ref 0.0–0.2)

## 2021-01-13 LAB — TSH: TSH: 2.387 u[IU]/mL (ref 0.320–4.118)

## 2021-01-13 LAB — SAMPLE TO BLOOD BANK

## 2021-01-13 MED ORDER — HEPARIN SOD (PORK) LOCK FLUSH 100 UNIT/ML IV SOLN
500.0000 [IU] | INTRAVENOUS | Status: AC | PRN
  Administered 2021-01-13: 500 [IU]
  Filled 2021-01-13: qty 5

## 2021-01-13 MED ORDER — SODIUM CHLORIDE 0.9% FLUSH
10.0000 mL | INTRAVENOUS | Status: AC | PRN
  Administered 2021-01-13: 10 mL
  Filled 2021-01-13: qty 10

## 2021-01-13 MED ORDER — LUMAKRAS 120 MG PO TABS: 960.0000 mg | ORAL_TABLET | Freq: Every morning | ORAL | 3 refills | Status: DC

## 2021-01-13 NOTE — Addendum Note (Signed)
Addended by: Dierdre Harness E on: 01/13/2021 11:56 AM   Modules accepted: Orders, SmartSet

## 2021-01-13 NOTE — Telephone Encounter (Signed)
Oral Chemotherapy Pharmacist Encounter  I spoke with patient and his wife for overview of new oral chemotherapy medication: Lumakras (sotorasib) for the treatment of metastatic non-small cell lung cancer, KRAS G12C-mutated, planned duration until disease progression or unacceptable drug toxicity.  Pt is doing well. Counseled patient on administration, dosing, side effects, monitoring, drug-food interactions, safe handling, storage, and disposal.  Patient will take Lumakras 120 mg tablets, 8 tablets (960 mg total) by mouth daily.  Patient knows to avoid proton pump inhibitors, histamine blockers, and grapefruit/grapefruit juice while on Lumakras.   Start date: pending manufacturer assistance approval - will update encounter once medication is obtained through assistance.   Side effects include but not limited to: decrease in blood counts, diarrhea, hepatotoxicity, arthralgias/musculoskeletal pains. Also reviewed rare but serious side effect of interstitial lung disease/pneumonitis that has been reported with use of medication.   Reviewed with patient importance of keeping a medication schedule and plan for any missed doses.  After discussion with patient no patient barriers to medication adherence identified.   Obtained patient's signature in clinic for Lumakras medication assistance application. Patient's copay for medication is $3321, which is unaffordable without patient assistance for patient.   Mr. Martinique voiced understanding and appreciation. All questions answered. Medication handout provided.  Provided patient with [facility] phone number. Patient knows to call the office with questions or concerns. Oral Chemotherapy Navigation Clinic will continue to follow.  Leron Croak, PharmD, BCPS Hematology/Oncology Clinical Pharmacist [facility] [phone] 01/13/2021 2:24 PM

## 2021-01-13 NOTE — Telephone Encounter (Signed)
Oral Oncology Patient Advocate Encounter  Received notification from [facility] that prior authorization for Lumakras is required.  PA submitted on CoverMyMeds Key [id number] Status is pending  Oral Oncology Clinic will continue to follow.  [place] Patient [place] Phone [phone] Fax [phone] 01/13/2021 11:08 AM

## 2021-01-13 NOTE — Telephone Encounter (Signed)
Oral Oncology Pharmacist Encounter  Received new prescription for Lumakras (sotorasib) for the treatment of metastatic non-small cell lung cancer, KRAS G12C-mutated, planned duration until disease progression or unacceptable drug toxicity.  Prescription dose and frequency assessed for appropriateness. Appropriate for therapy initiation.   CMP and CBC w/ Diff from 01/13/21 assessed, noted pltc of 90 K/uL and Hgb 10.5 g/dL. Scr of 1.49 mg/dL (CrCl ~47 mL/min) - no renal dose adjustments required.   Current medication list in Epic reviewed, no relevant/significant DDIs with Lumakras identified.  Evaluated chart and no patient barriers to medication adherence noted.   Prescription has been e-scribed to the [facility] for benefits analysis and approval.  Oral Oncology Clinic will continue to follow for insurance authorization, copayment issues, initial counseling and start date.  Leron Croak, PharmD, BCPS Hematology/Oncology Clinical Pharmacist [facility] [phone] 01/13/2021 11:03 AM

## 2021-01-13 NOTE — Telephone Encounter (Signed)
I set it on your desk for her to pick up

## 2021-01-13 NOTE — Telephone Encounter (Signed)
Oral Oncology Patient Advocate Encounter  Prior Authorization for Randall Wilkins has been approved.    PA# [id number] Effective dates: 01/13/21 through 01/13/22  Patients co-pay is $3321  Oral Oncology Clinic will continue to follow.   [place] Patient [place] Phone [phone] Fax [phone] 01/13/2021 12:04 PM

## 2021-01-13 NOTE — Progress Notes (Signed)
DISCONTINUE ON PATHWAY REGIMEN - Non-Small Cell Lung     A cycle is every 21 days:     Pembrolizumab      Pemetrexed      Carboplatin   **Always confirm dose/schedule in your pharmacy ordering system**  REASON: Disease Progression PRIOR TREATMENT: [id number]: Pembrolizumab 200 mg + Pemetrexed 500 mg/m2 + Carboplatin AUC=5 q21 Days x 4 Cycles TREATMENT RESPONSE: Progressive Disease (PD)  START OFF PATHWAY REGIMEN - Non-Small Cell Lung   [id number]:Sotorasib 960 mg PO Daily D1-28 q28 Days:   A cycle is every 28 days:     Sotorasib   **Always confirm dose/schedule in your pharmacy ordering system**  Patient Characteristics: Stage IV Metastatic, Nonsquamous, Second Line - Molecular Targeted Therapy, KRAS G12C Mutation Positive Therapeutic Status: Stage IV Metastatic Histology: Nonsquamous Cell ROS1 Rearrangement Status: Negative Other Mutations/Biomarkers: No Other Actionable Mutations Chemotherapy/Immunotherapy LOT: Not Appropriate Molecular Targeted Therapy: Second Paediatric nurse Therapy KRAS G12C Mutation Status: G12C Positive MET Exon 14 Mutation Status: Negative RET Gene Fusion Status: Negative EGFR Mutation Status: Negative/Wild Type NTRK Gene Fusion Status: Negative PD-L1 Expression Status: PD-L1 Positive 1-49% (TPS) ALK Rearrangement Status: Negative BRAF V600E Mutation Status: Negative Intent of Therapy: Non-Curative / Palliative Intent, Discussed with Patient

## 2021-01-13 NOTE — Telephone Encounter (Signed)
Aldona Bar with [employer] called stating she had been talking with Suanne Marker about this patient's assistance form for Humalog, she asked if someone could leave it up front for them to pick up to fax from their office.  Lonia Mad will be coming to pick that up.

## 2021-01-14 ENCOUNTER — Telehealth: Payer: Self-pay | Admitting: Internal Medicine

## 2021-01-14 ENCOUNTER — Telehealth: Payer: Self-pay | Admitting: Medical Oncology

## 2021-01-14 NOTE — Telephone Encounter (Signed)
Scheduled appointments per 1/31 los. Cancelled infusion appointments as well. Spoke to patient's wife who is aware of upcoming appointments and that infusions have been cancelled.

## 2021-01-14 NOTE — Telephone Encounter (Signed)
Teeth cleaning-I told wife it was okay for Randall Wilkins to get his teeth cleaned.

## 2021-01-15 ENCOUNTER — Telehealth: Payer: Self-pay | Admitting: *Deleted

## 2021-01-15 ENCOUNTER — Telehealth: Payer: Self-pay

## 2021-01-15 NOTE — Telephone Encounter (Signed)
Oral Oncology Patient Advocate Encounter  Met patient in lobby to complete application for [employer] in an effort to reduce patient's out of pocket expense for Lumakras to $0.    Application completed and faxed to [phone].   [employer] patient assistance phone number for follow up is [phone].   This encounter will be updated until final determination.   [place] Patient [place] Phone [phone] Fax [phone] 01/15/2021 3:00 PM

## 2021-01-16 ENCOUNTER — Other Ambulatory Visit: Payer: Self-pay

## 2021-01-16 ENCOUNTER — Ambulatory Visit: Payer: Medicare Other | Admitting: Emergency Medicine

## 2021-01-16 ENCOUNTER — Encounter: Payer: Self-pay | Admitting: Emergency Medicine

## 2021-01-16 DIAGNOSIS — R0609 Other forms of dyspnea: Secondary | ICD-10-CM

## 2021-01-16 DIAGNOSIS — R0600 Dyspnea, unspecified: Secondary | ICD-10-CM

## 2021-01-16 MED ORDER — SPIRIVA RESPIMAT 2.5 MCG/ACT IN AERS: 2.0000 | INHALATION_SPRAY | Freq: Every day | RESPIRATORY_TRACT | 0 refills | Status: DC

## 2021-01-16 NOTE — Addendum Note (Signed)
Addended by: Gavin Potters R on: 01/16/2021 04:25 PM   Modules accepted: Orders

## 2021-01-16 NOTE — Patient Instructions (Signed)
We will try starting Spiriva Respimat 2 sprays once daily. Keep track of whether this medication helps you. If it does improve your breathing then call us and let us know and we will send prescription to your pharmacy. Okay to keep albuterol available to use 2 puffs if needed for shortness of breath, chest tightness, wheezing. Continue to follow with Dr. Julien Nordmann and have your repeat scans performed as he recommends. Follow with Dr Lamonte Sakai in 4 months or sooner if you have any problems.

## 2021-01-16 NOTE — Assessment & Plan Note (Signed)
I think a lot of his dyspnea is due to the lung cancer itself as well as the deconditioning and effects of his chemotherapy. He did not get significant response from albuterol. He was found to have desaturation with exertion and has been started on 2 L/min. I discussed with him our goals regarding keeping his saturations greater than 90%. He knows that he may need to uptitrate in order accomplish this depending on his level of exertion. I think will be reasonable to do a trial of Spiriva to see if he gets benefit. If he does not then we do not have to continue it.

## 2021-01-16 NOTE — Progress Notes (Signed)
   Subjective:    Patient ID: Randall Wilkins, male    DOB: Dec 31, 1940, 80 y.o.   MRN: [id number]  HPI  ROV 09/04/20 --follow-up visit for 80 year old former smoker with multiple medical problems include diabetes, hypertension, hyperlipidemia, CAD/CABG, OSA on CPAP.  He underwent bronchoscopy on 08/06/2020 for bilateral rounded opacities and mediastinal lymphadenopathy.  This showed mucinous adenocarcinoma.  He is planning to start systemic chemotherapy and Keytruda on 9/27.  His breathing is doing ok. He is able to get around some, walk through through the house. No cough or wheeze at this time. His biggest barrier to mobility is his legs and imbalance, falls.   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  He has been treated with carboplatin [place], now [place].  He is preparing to start sotorasib.  He is not currently on any scheduled inhaled medication.  His most recent CT scan of the chest was 01/10/2021 which I have reviewed, shows similar bilateral upper lobe masses with some increased surrounding consolidation, adjacent loculated pleural fluid, increased left lower lobe nodularity.  He has been weak, having difficulty ambulating. He was started on supplemental O2 2L/min at the cancer center after he desaturated. He has not responded to albuterol.    Review of Systems As per HPI      Objective:   Physical Exam Vitals:   01/16/21 1523  BP: 132/72  Pulse: 68  Temp: (!) 97.2 F (36.2 C)  SpO2: 92%  Weight: 231 lb 3.2 oz (104.9 kg)  Height: 5\' 7"  (1.702 m)   Gen: Pleasant, obese, in no distress,  normal affect  ENT: No lesions,  mouth clear,  oropharynx clear, no postnasal drip  Neck: No JVD, no stridor  Lungs: No use of accessory muscles, scattered bilateral rhonchi, no wheezing  Cardiovascular: RRR, heart sounds normal, no  murmur or gallops, no peripheral edema  Musculoskeletal: No deformities, brace on his LLE  Neuro: alert, awake, non focal  Skin: Warm, no lesions or rash       Assessment & Plan:  Dyspnea on exertion I think a lot of his dyspnea is due to the lung cancer itself as well as the deconditioning and effects of his chemotherapy. He did not get significant response from albuterol. He was found to have desaturation with exertion and has been started on 2 L/min. I discussed with him our goals regarding keeping his saturations greater than 90%. He knows that he may need to uptitrate in order accomplish this depending on his level of exertion. I think will be reasonable to do a trial of Spiriva to see if he gets benefit. If he does not then we do not have to continue it.  Baltazar Apo, MD, PhD 01/16/2021, 4:01 PM [facility] [phone] or if no answer [phone]

## 2021-01-21 ENCOUNTER — Encounter: Payer: Self-pay | Admitting: Internal Medicine

## 2021-01-21 ENCOUNTER — Telehealth (INDEPENDENT_AMBULATORY_CARE_PROVIDER_SITE_OTHER): Payer: Medicare Other | Admitting: Internal Medicine

## 2021-01-21 VITALS — BP 136/75 | HR 63 | Wt 231.0 lb

## 2021-01-21 DIAGNOSIS — Z951 Presence of aortocoronary bypass graft: Secondary | ICD-10-CM | POA: Diagnosis not present

## 2021-01-21 DIAGNOSIS — I4891 Unspecified atrial fibrillation: Secondary | ICD-10-CM

## 2021-01-21 DIAGNOSIS — I10 Essential (primary) hypertension: Secondary | ICD-10-CM

## 2021-01-21 DIAGNOSIS — R531 Weakness: Secondary | ICD-10-CM | POA: Diagnosis not present

## 2021-01-21 NOTE — Progress Notes (Signed)
Virtual Visit via Video Note   This visit type was conducted due to national recommendations for restrictions regarding the COVID-19 Pandemic (e.g. social distancing) in an effort to limit this patient's exposure and mitigate transmission in our community.  Due to his co-morbid illnesses, this patient is at least at moderate risk for complications without adequate follow up.  This format is felt to be most appropriate for this patient at this time.  The patient does have access to video technology.  All issues noted in this document were discussed and addressed.  Limited physical exam was performed with this format.  Please refer to the patient's chart for his  consent to telehealth for [facility].   Caregility Video Visit  Date:  01/21/2021   ID:  Randall Wilkins, DOB 1941-02-24, MRN [id number] The patient was identified using 2 identifiers.  Evaluation Performed:  Follow-Up Visit  Patient Location:  [place] [id number]  Provider location:   [address], [place], [place] [address]  PCP:  [name], MD  Cardiologist:  Pixie Casino, MD Electrophysiologist:  None   Chief Complaint:  Fatigue, recent hospitalization  History of Present Illness:    Randall Wilkins is a 80 y.o. male who presents via audio/video conferencing for a telehealth visit today.  Randall Wilkins is seen today via telephone visit.  I spoke with him and his wife as he was just hospitalized with complications from chemotherapy.  Unfortunately had been diagnosed with stage IV lung cancer and has been undergoing chemotherapy for this.  He presented with acute on chronic renal failure, dehydration and GI bleeding requiring transfusion.  Creatinine initially was as high as 1.6-1.7 but improved with hydration down to 1.1.  After discharge however a few days ago his creatinine has gone up over 1.5.  His hemoglobin was low at around 7 and transfused up but has drifted down.  He received  additional transfusions through the cancer center.  He was told to hold his allopurinol and his aspirin was stopped.  His Eliquis was initially stopped they have resumed it at 5 mg twice daily for A. fib.  He still reports fatigue and weakness.    01/21/2021    The patient does not have symptoms concerning for COVID-19 infection (fever, chills, cough, or new SHORTNESS OF BREATH).    Prior CV studies:   The following studies were reviewed today:  Chart reviewed including recent hospitalization  PMHx:  Past Medical History:  Diagnosis Date  . Aortic stenosis, mild 11/14/2013  . Aortic valve sclerosis 03/29/2015  . Bilateral leg edema 05/21/2014  . CAD (coronary artery disease)   . Chronic diastolic congestive heart failure ([place])   . Chronic kidney disease (CKD), stage III (moderate) (HCC)   . Diabetes 1.5, managed as type 1 ([place]) 02/04/2013  . Diabetic peripheral neuropathy associated with type 1 diabetes mellitus ([place]) 11/14/2013  . Dysphagia   . Dyspnea on exertion 03/21/2015  . Exogenous obesity   . Gout   . Heart attack ([place])   . History of nuclear stress test 07/2011   dipyridamole; fixed inferolateral defect, worse at stress than rest; no reversible ischemia; low risk scan   . Hyperlipidemia   . Hypertension   . Hypothyroidism   . Insulin dependent diabetes mellitus   . Left foot drop   . Left main coronary artery disease 03/28/2015  . lung ca dx'd 08/2020  . Memory loss   . Obesity (BMI 30-39.9) 11/14/2013  .  Obstructive sleep apnea 03/21/2015  . OSA on CPAP    uses a cpap  . Peptic ulcer with hemorrhage 03/28/2015  . Peripheral neuropathy   . Pneumonia   . Rhabdomyolysis   . 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 Stroke ([place])    L patietal with small scattered lacunar infarcts  . Thrombocytopenia ([place]) 03/21/2015  . Venous insufficiency   . Weakness generalized 03/21/2015    Past Surgical History:  Procedure Laterality Date  . BACK  SURGERY  2002   lumbosacral. 11 back surgeries total  . BRONCHIAL BIOPSY  08/06/2020   Procedure: BRONCHIAL BIOPSIES;  Surgeon: Collene Gobble, MD;  Location: [facility] ENDOSCOPY;  Service: Pulmonary;;  . BRONCHIAL BRUSHINGS  08/06/2020   Procedure: BRONCHIAL BRUSHINGS;  Surgeon: Collene Gobble, MD;  Location: [facility] ENDOSCOPY;  Service: Pulmonary;;  . BRONCHIAL NEEDLE ASPIRATION BIOPSY  08/06/2020   Procedure: BRONCHIAL NEEDLE ASPIRATION BIOPSIES;  Surgeon: Collene Gobble, MD;  Location: [facility] ENDOSCOPY;  Service: Pulmonary;;  . Carotid Doppler  03/2013   bilat bulb/prox ICAs - mild amount of fibrous plaque with no evidence of diameter reduction  . CARPAL TUNNEL RELEASE Bilateral 08/09/2014   Procedure: BILATERAL CARPAL TUNNEL RELEASE;  Surgeon: Daryll Brod, MD;  Location: [place];  Service: Orthopedics;  Laterality: Bilateral;  ANESTHESIA:  IV REGIONAL BIL FAB  . CHOLECYSTECTOMY    . COLONOSCOPY    . CORONARY ANGIOPLASTY  10/13/1996  . CORONARY ANGIOPLASTY  09/21/1989   emergency PTCA  . CORONARY ANGIOPLASTY  10/13/1996   Multi-Link diagonal & OD stenting (Dr. Marella Chimes)  . CORONARY ANGIOPLASTY  12/03/1997   disease of mid DX-1 ~50% & in mid PLA & PDA (distal lesions) (Dr. Marella Chimes)   . CORONARY ANGIOPLASTY  10/14/1999   progression of disease distal PLA & PDA; progression of disease prox RCA - moderate (Dr. Marella Chimes)   . CORONARY ANGIOPLASTY WITH STENT PLACEMENT  04/04/2004   4.0x73m non-DES (thrombectomy via AngioJet) to RCA for high grade stenosis (Dr. RMarella Chimes  . CORONARY ARTERY BYPASS GRAFT N/A 03/29/2015   Procedure: CORONARY ARTERY BYPASS GRAFTING TIMES TWO USING LEFT INTERNAL MAMMARY ARTERY AND RIGHT LEG GREATER SAPHENOUS VEIN HARVESTED ENDOSCOPICALLY.;  Surgeon: CRexene Alberts MD;  [place] [facility]  Service: Open Heart Surgery;  Laterality: N/A;  . ESOPHAGOGASTRODUODENOSCOPY N/A 03/27/2015   Procedure: ESOPHAGOGASTRODUODENOSCOPY (EGD);  Surgeon: MClarene Essex MD;   [facility];  Service: Endoscopy;  Laterality: N/A;  possible dilation  . ESOPHAGOGASTRODUODENOSCOPY N/A 10/05/2020   Procedure: ESOPHAGOGASTRODUODENOSCOPY (EGD);  Surgeon: OArta Silence MD;  [facility];  Service: Endoscopy;  Laterality: N/A;  . FINE NEEDLE ASPIRATION  08/06/2020   Procedure: FINE NEEDLE ASPIRATION (FNA) LINEAR;  Surgeon: BCollene Gobble MD;  [facility];  Service: Pulmonary;;  . IR IMAGING GUIDED PORT INSERTION  10/07/2020  . LEFT HEART CATHETERIZATION WITH CORONARY ANGIOGRAM N/A 03/28/2015   Procedure: LEFT HEART CATHETERIZATION WITH CORONARY ANGIOGRAM;  Surgeon: Peter M JMartinique MD;  [facility];  Service: Cardiovascular;  Laterality: N/A;  . SINUS ENDO W/FUSION    . TONSILLECTOMY    . TRANSTHORACIC ECHOCARDIOGRAM  08/08/2013   EF 55-60%, mild conc hypertrophy, grade 1 diastolic dysfunction; AV with mild stenosis; LA & RA mildly dilated  . VIDEO BRONCHOSCOPY WITH ENDOBRONCHIAL NAVIGATION N/A 08/06/2020   Procedure: VIDEO BRONCHOSCOPY WITH ENDOBRONCHIAL NAVIGATION;  Surgeon: BCollene Gobble MD;  Location:  [facility]OSCOPY;  Service: Pulmonary;  Laterality: N/A;  . VIDEO BRONCHOSCOPY WITH ENDOBRONCHIAL ULTRASOUND N/A 08/06/2020   Procedure: VIDEO BRONCHOSCOPY WITH ENDOBRONCHIAL ULTRASOUND;  Surgeon: Collene Gobble, MD;  Location: [facility]COPY;  Service: Pulmonary;  Laterality: N/A;    FAMHx:  Family History  Problem Relation Age of Onset  . Heart disease Mother   . Coronary artery disease Father   . Cancer Maternal Grandmother   . Heart Problems Maternal Grandfather   . Diabetes Son        borderline     SOCHx:   reports that he quit smoking about 47 years ago. His smoking use included cigarettes. He has a 40.00 pack-year smoking history. He has never used smokeless tobacco. He reports that he does not drink alcohol and does not use drugs.  ALLERGIES:  Allergies  Allergen Reactions  . Actos [Pioglitazone] Swelling  .  Metformin And Related Nausea Only  . Niaspan [Niacin Er] Itching and Rash    MEDS:  Current Meds  Medication Sig  . albuterol (VENTOLIN HFA) 108 (90 Base) MCG/ACT inhaler Inhale 2 puffs into the lungs every 6 (six) hours as needed for wheezing or shortness of breath.  . allopurinol (ZYLOPRIM) 100 MG tablet Take 1 tablet (100 mg total) by mouth daily.  Marland Kitchen amitriptyline (ELAVIL) 75 MG tablet Take 75 mg by mouth at bedtime.  Marland Kitchen apixaban (ELIQUIS) 2.5 MG TABS tablet Take 1 tablet (2.5 mg total) by mouth 2 (two) times daily.  . Blood Glucose Monitoring Suppl (CONTOUR NEXT MONITOR) w/Device KIT 1 Device by Does not apply route 3 (three) times daily. Use to check blood sugars 3 times daily. Dx Code E13.9  . CONTOUR NEXT TEST test strip USE 1 STRIP TO CHECK GLUCOSE 4 TIMES DAILY  . donepezil (ARICEPT) 10 MG tablet Take 1 tablet (10 mg total) by mouth daily.  . folic acid (FOLVITE) 1 MG tablet Take 1 tablet (1 mg total) by mouth daily.  . furosemide (LASIX) 40 MG tablet Take 1 tablet (40 mg total) by mouth in the morning.  . insulin degludec (TRESIBA FLEXTOUCH) 200 UNIT/ML FlexTouch Pen Inject 24 Units into the skin at bedtime. (Patient taking differently: Inject 20 Units into the skin at bedtime. 24 units on Chemo days)  . insulin lispro (HUMALOG) 100 UNIT/ML cartridge Inject 5-15 Units into the skin 3 (three) times daily with meals. Sliding scale.  . Insulin Pen Needle (EASY COMFORT PEN NEEDLES) 33G X 4 MM MISC 1 each by Does not apply route See admin instructions. Use to inject insulin 5 times daily.  Marland Kitchen levothyroxine (SYNTHROID, LEVOTHROID) 75 MCG tablet Take 75 mcg by mouth daily before breakfast.   . lidocaine (XYLOCAINE) 2 % injection See admin instructions.  . lidocaine (XYLOCAINE) 2 % solution Use as directed 15 mLs in the mouth or throat as needed for mouth pain.  Marland Kitchen lidocaine-prilocaine (EMLA) cream Apply 1 application topically as needed. Apply a teaspoon over port site at least 1 hour prior to  lab appt. Do not rub in and cover with plastic wrap.  . lisinopril (ZESTRIL) 10 MG tablet TAKE ONE TABLET DAILY (Patient taking differently: Take 10 mg by mouth daily.)  . metFORMIN (GLUCOPHAGE-XR) 750 MG 24 hr tablet TAKE 1 TABLET WITH DINNER  . metoprolol succinate (TOPROL-XL) 25 MG 24 hr tablet Take 1 tablet (25 mg total) by mouth daily.  . Nutritional Supplements (ENSURE MAX PROTEIN PO) Take by mouth.  . ondansetron (ZOFRAN) 4 MG tablet Take 1 tablet (  4 mg total) by mouth every 6 (six) hours as needed for nausea.  . OXYGEN Inhale 2 L into the lungs. At bedtime and during the day prn  . potassium chloride SA (K-DUR,KLOR-CON) 20 MEQ tablet Take 20 mEq by mouth 2 (two) times daily.   . prochlorperazine (COMPAZINE) 10 MG tablet Take 1 tablet (10 mg total) by mouth every 6 (six) hours as needed for nausea or vomiting.  . rosuvastatin (CRESTOR) 20 MG tablet Take 20 mg by mouth daily.  . sotorasib (LUMAKRAS) 120 MG TABS Take 960 mg by mouth every morning.  . Tamsulosin HCl (FLOMAX) 0.4 MG CAPS Take 0.4 mg by mouth daily.   . Tiotropium Bromide Monohydrate (SPIRIVA RESPIMAT) 2.5 MCG/ACT AERS Inhale 2 puffs into the lungs daily.  . vitamin B-12 (CYANOCOBALAMIN) 1000 MCG tablet Take 1,000 mcg by mouth daily.   Current Facility-Administered Medications for the 01/21/21 encounter (Video Visit) with Pixie Casino, MD  Medication  . methylPREDNISolone acetate (DEPO-MEDROL) injection 80 mg     ROS: Pertinent items noted in HPI and remainder of comprehensive ROS otherwise negative.  Labs/Other Tests and Data Reviewed:    Recent Labs: 10/02/2020: B Natriuretic Peptide 344.4 10/08/2020: Magnesium 2.0 01/13/2021: ALT 10; BUN 26; Creatinine 1.49; Hemoglobin 10.5; Platelet Count 90; Potassium 3.8; Sodium 142; TSH 2.387   Recent Lipid Panel Lab Results  Component Value Date/Time   CHOL 94 01/06/2021 09:12 AM   TRIG 109.0 01/06/2021 09:12 AM   HDL 36.20 (L) 01/06/2021 09:12 AM   CHOLHDL 3 01/06/2021  09:12 AM   LDLCALC 36 01/06/2021 09:12 AM    Wt Readings from Last 3 Encounters:  01/21/21 231 lb (104.8 kg)  01/16/21 231 lb 3.2 oz (104.9 kg)  01/13/21 234 lb 9.6 oz (106.4 kg)     Exam:    Vital Signs:  BP 136/75   Pulse 63   Wt 231 lb (104.8 kg)   SpO2 98% Comment: on 2L  BMI 36.18 kg/m    General appearance: alert, no distress and on nasal cannula Lungs: no visible breathing difficulty Abdomen: obese Extremities: edema trivial Skin: Skin color, texture, turgor normal. No rashes or lesions Neurologic: Grossly normal Psych: Flat  ASSESSMENT & PLAN:    1. Stage IV lung cancer 2. New onset atrial fibrillation - CHADSVASC score of 6 3. Recent multifocal pneumonia 4. Coronary artery disease status post BMS to the RCA in 2005, recent CABG 2 (LIMA to LAD - for critical left main disease, SVG to OM1)- 2016 5. Obesity 6. Hypertension-controlled 7. Dyslipidemia 8. Prior stroke 9. Diabetic neuropathy 10. Diabetic nephropathy - left foot drop 11. Type 1 diabetes on insulin 12. LE edema 13. Mild aortic stenosis 14. AKI on CKD 3B  Randall Wilkins continues to have fatigue and dyspnea.  He was just seen by his pulmonologist Dr. Lamonte Sakai a few days ago who felt that most of his symptoms were likely related to the lung cancer and deconditioning.  He was not felt to be volume overloaded.  Per his wife and the video visit there was trace edema.  He is on an oxygen nasal cannula.  Blood pressure is excellent.  Heart rates in the 60s and if he is in A. fib he is rate controlled.  She denies any bleeding on the Eliquis.  His hemoglobin has been stable.  He has not had any gout flares and was restarted on low-dose allopurinol.  Creatinine seems to be stable between 1.4 and 1.5.  I am being  told that he is about to start an 8 pill chemo regimen.  Hopefully this will be more helpful for him.  I would recommend follow-up with me in 6 months with no medicine changes at this time.  COVID-19  Education: The signs and symptoms of COVID-19 were discussed with the patient and how to seek care for testing (follow up with PCP or arrange E-visit).  The importance of social distancing was discussed today.  Patient Risk:   After full review of this patients clinical status, I feel that they are at least moderate risk at this time.  Time:   Today, I have spent 25 minutes with the patient with telehealth technology discussing recent acute renal failure, GI bleeding, anemia, gout prevention, anticoagulation with regards to A. fib.     Medication Adjustments/Labs and Tests Ordered: Current medicines are reviewed at length with the patient today.  Concerns regarding medicines are outlined above.   Tests Ordered: No orders of the defined types were placed in this encounter.   Medication Changes: No orders of the defined types were placed in this encounter.   Disposition:  in 6 month(s)  Pixie Casino, MD, [place], [place] Director of the [facility] Lipid [facility] &  Cardiovascular Risk Reduction Clinic Diplomate of the American Board of Clinical Lipidology Attending Cardiologist  Direct Dial: [phone]  Fax: [phone]  Website:  [email or web address]  Pixie Casino, MD  01/21/2021 8:44 AM

## 2021-01-21 NOTE — Patient Instructions (Signed)

## 2021-01-22 NOTE — Telephone Encounter (Signed)
Oral Oncology Pharmacist Encounter  Patient approved to receive Lumakras (sotorasib) at no cost through [employer].   Approval dates: 01/22/21-12/13/2021  [facility] phone number for follow up is: [place], PharmD, BCPS Hematology/Oncology Clinical Pharmacist [facility] [phone] 01/22/2021 12:55 PM

## 2021-01-23 NOTE — Telephone Encounter (Signed)
Oral Chemotherapy Pharmacist Encounter   Spoke with patient's wife today to follow up regarding patient's oral chemotherapy medication: Lumakras (sotorasib)  Start date of oral chemotherapy: 01/25/21  Patient's wife has set up first shipment of [facility]. This will be delivered to patient's home on 01/24/21. Patient plans to start mediation on 01/25/21 AM. Reviewed with patient's wife refill process for medication and provided her with [employer]'s phone number to set up refills [phone]).  Patient and wife know to call the office with questions or concerns.  Leron Croak, PharmD, BCPS Hematology/Oncology Clinical Pharmacist [facility] [phone] 01/23/2021 8:54 AM

## 2021-01-24 DIAGNOSIS — E119 Type 2 diabetes mellitus without complications: Secondary | ICD-10-CM | POA: Diagnosis not present

## 2021-01-24 DIAGNOSIS — Z794 Long term (current) use of insulin: Secondary | ICD-10-CM | POA: Diagnosis not present

## 2021-01-24 DIAGNOSIS — H26493 Other secondary cataract, bilateral: Secondary | ICD-10-CM | POA: Diagnosis not present

## 2021-01-24 DIAGNOSIS — H3530 Unspecified macular degeneration: Secondary | ICD-10-CM | POA: Diagnosis not present

## 2021-01-24 LAB — HM DIABETES EYE EXAM

## 2021-01-29 ENCOUNTER — Other Ambulatory Visit: Payer: Self-pay

## 2021-01-29 ENCOUNTER — Ambulatory Visit (INDEPENDENT_AMBULATORY_CARE_PROVIDER_SITE_OTHER): Payer: Medicare Other | Admitting: Ophthalmology

## 2021-01-29 ENCOUNTER — Encounter (INDEPENDENT_AMBULATORY_CARE_PROVIDER_SITE_OTHER): Payer: Self-pay | Admitting: Ophthalmology

## 2021-01-29 DIAGNOSIS — E1021 Type 1 diabetes mellitus with diabetic nephropathy: Secondary | ICD-10-CM | POA: Diagnosis not present

## 2021-01-29 DIAGNOSIS — H35372 Puckering of macula, left eye: Secondary | ICD-10-CM | POA: Insufficient documentation

## 2021-01-29 DIAGNOSIS — H35371 Puckering of macula, right eye: Secondary | ICD-10-CM

## 2021-01-29 HISTORY — DX: Puckering of macula, right eye: H35.371

## 2021-01-29 NOTE — Assessment & Plan Note (Addendum)
The nature of macular pucker (epiretinal membrane ERM) was discussed with the patient as well as threshold criteria for vitrectomy surgery. I explained that in rare cases another surgery is needed to actually remove a second wrinkle should it regrow.  Most often, the epiretinal membrane and underlying wrinkled internal limiting membrane are removed with the first surgery, to accomplish the goals.   If the operative eye is [place] (natural lens still present), cataract surgery is often recommended prior to Vitrectomy. This will enable the retina surgeon to have the best view during surgery and the patient to obtain optimal results in the future. Treatment options were discussed.  Suggested to consider surgery.  Patient says he is having significant visual impact to his visual acuity unable see his computer and do the things he enjoys particular in the right eye.  Risk and benefits of surgical intervention were discussed.  Typical course r of recovery were reviewed with the patient, so discussed the typical operative experience as much like cataract surgery yet different part of the eye that is being operated on.  I reviewed with the patient that the recovery typically takes him some weeks even some months to recover as much vision is as possible

## 2021-01-29 NOTE — Progress Notes (Signed)
01/29/2021     CHIEF COMPLAINT Patient presents for Retina Evaluation (NP AMD OU - Ref'd by Dr. Janina Mayo c/o [facility] blur OU x 2 months approx. Pt would like to know if chemo treatments can cause blur OU. Pt has been receiving chemo since sept 2021. Pt denies ocular pain, flashes, or floaters OU. Pt is on oxygen nightly for sleep apnea./A1c: 6.4, 12/2020/LBS: 122 this AM)   HISTORY OF PRESENT ILLNESS: Randall Wilkins is a 80 y.o. male who presents to the clinic today for:   HPI    Retina Evaluation    In both eyes.  This started 2 months ago.  Duration of 2 months.  Context:  distance vision, mid-range vision and near vision.  Treatments tried include no treatments. Additional comments: NP AMD OU - Ref'd by Dr. Gershon Crane  Pt c/o [facility] blur OU x 2 months approx. Pt would like to know if chemo treatments can cause blur OU. Pt has been receiving chemo since sept 2021. Pt denies ocular pain, flashes, or floaters OU. Pt is on oxygen nightly for sleep apnea. A1c: 6.4, 12/2020 LBS: 122 this AM       Last edited by Rockie Neighbours, [place] on 01/29/2021  1:03 PM. (History)      Referring physician: [name], MD [address],  [place] [id number]  HISTORICAL INFORMATION:   Selected notes from the MEDICAL RECORD NUMBER    Lab Results  Component Value Date   HGBA1C 6.4 01/06/2021     CURRENT MEDICATIONS: No current outpatient medications on file. (Ophthalmic Drugs)   No current facility-administered medications for this visit. (Ophthalmic Drugs)   Current Outpatient Medications (Other)  Medication Sig  . albuterol (VENTOLIN HFA) 108 (90 Base) MCG/ACT inhaler Inhale 2 puffs into the lungs every 6 (six) hours as needed for wheezing or shortness of breath.  . allopurinol (ZYLOPRIM) 100 MG tablet Take 1 tablet (100 mg total) by mouth daily.  Marland Kitchen amitriptyline (ELAVIL) 75 MG tablet Take 75 mg by mouth at bedtime.  Marland Kitchen apixaban (ELIQUIS) 2.5 MG TABS tablet Take 1 tablet (2.5  mg total) by mouth 2 (two) times daily.  . Blood Glucose Monitoring Suppl (CONTOUR NEXT MONITOR) w/Device KIT 1 Device by Does not apply route 3 (three) times daily. Use to check blood sugars 3 times daily. Dx Code E13.9  . CONTOUR NEXT TEST test strip USE 1 STRIP TO CHECK GLUCOSE 4 TIMES DAILY  . donepezil (ARICEPT) 10 MG tablet Take 1 tablet (10 mg total) by mouth daily.  . folic acid (FOLVITE) 1 MG tablet Take 1 tablet (1 mg total) by mouth daily.  . furosemide (LASIX) 40 MG tablet Take 1 tablet (40 mg total) by mouth in the morning.  . insulin degludec (TRESIBA FLEXTOUCH) 200 UNIT/ML FlexTouch Pen Inject 24 Units into the skin at bedtime. (Patient taking differently: Inject 20 Units into the skin at bedtime. 24 units on Chemo days)  . insulin lispro (HUMALOG) 100 UNIT/ML cartridge Inject 5-15 Units into the skin 3 (three) times daily with meals. Sliding scale.  . Insulin Pen Needle (EASY COMFORT PEN NEEDLES) 33G X 4 MM MISC 1 each by Does not apply route See admin instructions. Use to inject insulin 5 times daily.  Marland Kitchen levothyroxine (SYNTHROID, LEVOTHROID) 75 MCG tablet Take 75 mcg by mouth daily before breakfast.   . lidocaine (XYLOCAINE) 2 % injection See admin instructions.  . lidocaine (XYLOCAINE) 2 % solution Use as directed 15 mLs in  the mouth or throat as needed for mouth pain.  Marland Kitchen lidocaine-prilocaine (EMLA) cream Apply 1 application topically as needed. Apply a teaspoon over port site at least 1 hour prior to lab appt. Do not rub in and cover with plastic wrap.  . lisinopril (ZESTRIL) 10 MG tablet TAKE ONE TABLET DAILY (Patient taking differently: Take 10 mg by mouth daily.)  . metFORMIN (GLUCOPHAGE-XR) 750 MG 24 hr tablet TAKE 1 TABLET WITH DINNER  . metoprolol succinate (TOPROL-XL) 25 MG 24 hr tablet Take 1 tablet (25 mg total) by mouth daily.  . Nutritional Supplements (ENSURE MAX PROTEIN PO) Take by mouth.  . ondansetron (ZOFRAN) 4 MG tablet Take 1 tablet (4 mg total) by mouth every 6  (six) hours as needed for nausea.  . OXYGEN Inhale 2 L into the lungs. At bedtime and during the day prn  . potassium chloride SA (K-DUR,KLOR-CON) 20 MEQ tablet Take 20 mEq by mouth 2 (two) times daily.   . prochlorperazine (COMPAZINE) 10 MG tablet Take 1 tablet (10 mg total) by mouth every 6 (six) hours as needed for nausea or vomiting.  . rosuvastatin (CRESTOR) 20 MG tablet Take 20 mg by mouth daily.  . sotorasib (LUMAKRAS) 120 MG TABS Take 960 mg by mouth every morning.  . Tamsulosin HCl (FLOMAX) 0.4 MG CAPS Take 0.4 mg by mouth daily.   . Tiotropium Bromide Monohydrate (SPIRIVA RESPIMAT) 2.5 MCG/ACT AERS Inhale 2 puffs into the lungs daily.  . vitamin B-12 (CYANOCOBALAMIN) 1000 MCG tablet Take 1,000 mcg by mouth daily.   Current Facility-Administered Medications (Other)  Medication Route  . methylPREDNISolone acetate (DEPO-MEDROL) injection 80 mg Other      REVIEW OF SYSTEMS:    ALLERGIES Allergies  Allergen Reactions  . Actos [Pioglitazone] Swelling  . Metformin And Related Nausea Only  . Niaspan [Niacin Er] Itching and Rash    PAST MEDICAL HISTORY Past Medical History:  Diagnosis Date  . Aortic stenosis, mild 11/14/2013  . Aortic valve sclerosis 03/29/2015  . Bilateral leg edema 05/21/2014  . CAD (coronary artery disease)   . Chronic diastolic congestive heart failure ([place])   . Chronic kidney disease (CKD), stage III (moderate) (HCC)   . Diabetes 1.5, managed as type 1 ([place]) 02/04/2013  . Diabetic peripheral neuropathy associated with type 1 diabetes mellitus ([place]) 11/14/2013  . Dysphagia   . Dyspnea on exertion 03/21/2015  . Exogenous obesity   . Gout   . Heart attack ([place])   . History of nuclear stress test 07/2011   dipyridamole; fixed inferolateral defect, worse at stress than rest; no reversible ischemia; low risk scan   . Hyperlipidemia   . Hypertension   . Hypothyroidism   . Insulin dependent diabetes mellitus   . Left foot drop   . Left main coronary artery  disease 03/28/2015  . lung ca dx'd 08/2020  . Memory loss   . Obesity (BMI 30-39.9) 11/14/2013  . Obstructive sleep apnea 03/21/2015  . OSA on CPAP    uses a cpap  . Peptic ulcer with hemorrhage 03/28/2015  . Peripheral neuropathy   . Pneumonia   . Rhabdomyolysis   . 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 Stroke ([place])    L patietal with small scattered lacunar infarcts  . Thrombocytopenia ([place]) 03/21/2015  . Venous insufficiency   . Weakness generalized 03/21/2015   Past Surgical History:  Procedure Laterality Date  . BACK SURGERY  2002  lumbosacral. 11 back surgeries total  . BRONCHIAL BIOPSY  08/06/2020   Procedure: BRONCHIAL BIOPSIES;  Surgeon: Collene Gobble, MD;  Location[facility] ENDOSCOPY;  Service: Pulmonary;;  . BRONCHIAL BRUSHINGS  08/06/2020   Procedure: BRONCHIAL BRUSHINGS;  Surgeon: Collene Gobble, MD;  Location[facility] ENDOSCOPY;  Service: Pulmonary;;  . BRONCHIAL NEEDLE ASPIRATION BIOPSY  08/06/2020   Procedure: BRONCHIAL NEEDLE ASPIRATION BIOPSIES;  Surgeon: Collene Gobble, MD;  Location: [facility] ENDOSCOPY;  Service: Pulmonary;;  . Carotid Doppler  03/2013   bilat bulb/prox ICAs - mild amount of fibrous plaque with no evidence of diameter reduction  . CARPAL TUNNEL RELEASE Bilateral 08/09/2014   Procedure: BILATERAL CARPAL TUNNEL RELEASE;  Surgeon: Daryll Brod, MD;  Location: [facility];  Service: Orthopedics;  Laterality: Bilateral;  ANESTHESIA:  IV REGIONAL BIL FAB  . CHOLECYSTECTOMY    . COLONOSCOPY    . CORONARY ANGIOPLASTY  10/13/1996  . CORONARY ANGIOPLASTY  09/21/1989   emergency PTCA  . CORONARY ANGIOPLASTY  10/13/1996   Multi-Link diagonal & OD stenting (Dr. Marella Chimes)  . CORONARY ANGIOPLASTY  12/03/1997   disease of mid DX-1 ~50% & in mid PLA & PDA (distal lesions) (Dr. Marella Chimes)   . CORONARY ANGIOPLASTY  10/14/1999   progression of disease distal PLA & PDA; progression of disease prox RCA - moderate (Dr. Marella Chimes)   . CORONARY ANGIOPLASTY WITH STENT PLACEMENT  04/04/2004   4.0x8mm non-DES (thrombectomy via AngioJet) to RCA for high grade stenosis (Dr. Marella Chimes)  . CORONARY ARTERY BYPASS GRAFT N/A 03/29/2015   Procedure: CORONARY ARTERY BYPASS GRAFTING TIMES TWO USING LEFT INTERNAL MAMMARY ARTERY AND RIGHT LEG GREATER SAPHENOUS VEIN HARVESTED ENDOSCOPICALLY.;  Surgeon: Rexene Alberts, MD;  Location: [facility];  Service: Open Heart Surgery;  Laterality: N/A;  . ESOPHAGOGASTRODUODENOSCOPY N/A 03/27/2015   Procedure: ESOPHAGOGASTRODUODENOSCOPY (EGD);  Surgeon: Clarene Essex, MD;  Location: [facility] ENDOSCOPY;  Service: Endoscopy;  Laterality: N/A;  possible dilation  . ESOPHAGOGASTRODUODENOSCOPY N/A 10/05/2020   Procedure: ESOPHAGOGASTRODUODENOSCOPY (EGD);  Surgeon: Arta Silence, MD;  Location: [facility] ENDOSCOPY;  Service: Endoscopy;  Laterality: N/A;  . FINE NEEDLE ASPIRATION  08/06/2020   Procedure: FINE NEEDLE ASPIRATION (FNA) LINEAR;  Surgeon: Collene Gobble, MD;  Location: [facility] ENDOSCOPY;  Service: Pulmonary;;  . IR IMAGING GUIDED PORT INSERTION  10/07/2020  . LEFT HEART CATHETERIZATION WITH CORONARY ANGIOGRAM N/A 03/28/2015   Procedure: LEFT HEART CATHETERIZATION WITH CORONARY ANGIOGRAM;  Surgeon: Peter M Martinique, MD;  Location: [facility] CATH LAB;  Service: Cardiovascular;  Laterality: N/A;  . SINUS ENDO W/FUSION    . TONSILLECTOMY    . TRANSTHORACIC ECHOCARDIOGRAM  08/08/2013   EF 55-60%, mild conc hypertrophy, grade 1 diastolic dysfunction; AV with mild stenosis; LA & RA mildly dilated  . VIDEO BRONCHOSCOPY WITH ENDOBRONCHIAL NAVIGATION N/A 08/06/2020   Procedure: VIDEO BRONCHOSCOPY WITH ENDOBRONCHIAL NAVIGATION;  Surgeon: Collene Gobble, MD;  Location: [facility] ENDOSCOPY;  Service: Pulmonary;  Laterality: N/A;  . VIDEO BRONCHOSCOPY WITH ENDOBRONCHIAL ULTRASOUND N/A 08/06/2020   Procedure: VIDEO BRONCHOSCOPY WITH ENDOBRONCHIAL ULTRASOUND;  Surgeon: Collene Gobble, MD;  Location: [facility] ENDOSCOPY;  Service: Pulmonary;   Laterality: N/A;    FAMILY HISTORY Family History  Problem Relation Age of Onset  . Heart disease Mother   . Coronary artery disease Father   . Cancer Maternal Grandmother   . Heart Problems Maternal Grandfather   . Diabetes Son        borderline     SOCIAL HISTORY Social History  Tobacco Use  . Smoking status: Former Smoker    Packs/day: 2.00    Years: 20.00    Pack years: 40.00    Types: Cigarettes    Quit date: 01/25/1974    Years since quitting: 47.0  . Smokeless tobacco: Never Used  Vaping Use  . Vaping Use: Never used  Substance Use Topics  . Alcohol use: No    Alcohol/week: 0.0 standard drinks  . Drug use: No         OPHTHALMIC EXAM:  Base Eye Exam    Visual Acuity (ETDRS)      Right Left   Dist [place] 20/50 +2 20/25 -1   Dist ph  NI        Tonometry (Tonopen, 12:59 PM)      Right Left   Pressure 16 19       Pupils      Pupils Dark Light Shape React APD   Right PERRL 2 1 Round Brisk None   Left PERRL 2 1 Round Brisk None       Visual Fields (Counting fingers)      Left Right    Full Full       Extraocular Movement      Right Left    Full Full       Neuro/Psych    Oriented x3: Yes   Mood/Affect: Normal       Dilation    Both eyes: 1.0% Mydriacyl, 2.5% Phenylephrine @ 1:05 PM        Slit Lamp and Fundus Exam    External Exam      Right Left   External Normal Normal       Slit Lamp Exam      Right Left   Lids/Lashes Normal Normal   Conjunctiva/Sclera White and quiet White and quiet   Cornea Clear Clear   Anterior Chamber Deep and quiet Deep and quiet   Iris Round and reactive Round and reactive   Lens Centered posterior chamber intraocular lens Centered posterior chamber intraocular lens   Anterior Vitreous Normal Normal       Fundus Exam      Right Left   Posterior Vitreous Posterior vitreous detachment Posterior vitreous detachment   Disc Normal Normal   C/D Ratio 0.5 0.5   Macula Epiretinal membrane, moderate  topographic distortion, Early age related macular degeneration Early age related macular degeneration   Vessels Normal, no DR Normal, no DR   Periphery Normal Normal          IMAGING AND PROCEDURES  Imaging and Procedures for 01/29/21  OCT, Retina - OU - Both Eyes       Right Eye Quality was good. Scan locations included subfoveal. Central Foveal Thickness: 297. Progression has no prior data. Findings include epiretinal membrane.   Left Eye Quality was good. Scan locations included subfoveal. Central Foveal Thickness: 314. Progression has no prior data. Findings include epiretinal membrane.   Notes Severe retinal thickening of the temporal aspect of the fovea and into the FAZ OD  OS with minor epiretinal membrane in the temporal aspect of the macula, no foveal distortion                ASSESSMENT/PLAN:  Type 1 diabetes mellitus with nephropathy No signs of detectable diabetic retinopathy OU  Macular pucker, right eye The nature of macular pucker (epiretinal membrane ERM) was discussed with the patient as well as threshold criteria for vitrectomy surgery. I explained that in rare  cases another surgery is needed to actually remove a second wrinkle should it regrow.  Most often, the epiretinal membrane and underlying wrinkled internal limiting membrane are removed with the first surgery, to accomplish the goals.   If the operative eye is [place] (natural lens still present), cataract surgery is often recommended prior to Vitrectomy. This will enable the retina surgeon to have the best view during surgery and the patient to obtain optimal results in the future. Treatment options were discussed.  Suggested to consider surgery.  Patient says he is having significant visual impact to his visual acuity unable see his computer and do the things he enjoys particular in the right eye.  Risk and benefits of surgical intervention were discussed.  Typical course r of recovery were  reviewed with the patient, so discussed the typical operative experience as much like cataract surgery yet different part of the eye that is being operated on.  I reviewed with the patient that the recovery typically takes him some weeks even some months to recover as much vision is as possible      ICD-10-CM   1. Macular pucker, right eye  H35.371 OCT, Retina - OU - Both Eyes  2. Macular pucker, left eye  H35.372 OCT, Retina - OU - Both Eyes  3. Type 1 diabetes mellitus with nephropathy (HCC)  E10.21     1.  Surgical intervention is offered in the right eyes so that the eye can begin to its recovery process to maximize its own visual acuity potential with the healing process which I described can take weeks to months to the patient and family 2.  Patient to return follow-up for preoperative visit to discuss the use of preoperative medications and instructions  3.  Ophthalmic Meds Ordered this visit:  No orders of the defined types were placed in this encounter.      No follow-ups on file.  There are no Patient Instructions on file for this visit.   Explained the diagnoses, plan, and follow up with the patient and they expressed understanding.  Patient expressed understanding of the importance of proper follow up care.   Clent Demark Ann Bohne M.D. Diseases & Surgery of the Retina and Vitreous [facility] [place] 01/29/21     Abbreviations: M myopia (nearsighted); A astigmatism; H hyperopia (farsighted); P presbyopia; Mrx spectacle prescription;  CTL contact lenses; OD right eye; OS left eye; OU both eyes  XT exotropia; ET esotropia; PEK punctate epithelial keratitis; PEE punctate epithelial erosions; DES dry eye syndrome; MGD meibomian gland dysfunction; ATs artificial tears; PFAT's preservative free artificial tears; [place] nuclear sclerotic cataract; PSC posterior subcapsular cataract; ERM epi-retinal membrane; PVD posterior vitreous detachment; RD retinal detachment; DM  diabetes mellitus; DR diabetic retinopathy; NPDR non-proliferative diabetic retinopathy; PDR proliferative diabetic retinopathy; CSME clinically significant macular edema; DME diabetic macular edema; dbh dot blot hemorrhages; CWS cotton wool spot; POAG primary open angle glaucoma; C/D cup-to-disc ratio; HVF humphrey visual field; GVF goldmann visual field; OCT optical coherence tomography; IOP intraocular pressure; BRVO Branch retinal vein occlusion; CRVO central retinal vein occlusion; CRAO central retinal artery occlusion; BRAO branch retinal artery occlusion; RT retinal tear; SB scleral buckle; PPV pars plana vitrectomy; VH Vitreous hemorrhage; PRP panretinal laser photocoagulation; IVK intravitreal kenalog; VMT vitreomacular traction; MH Macular hole;  NVD neovascularization of the disc; NVE neovascularization elsewhere; AREDS age related eye disease study; ARMD age related macular degeneration; POAG primary open angle glaucoma; EBMD epithelial/anterior basement membrane dystrophy; ACIOL anterior chamber intraocular lens;  IOL intraocular lens; PCIOL posterior chamber intraocular lens; Phaco/IOL phacoemulsification with intraocular lens placement; [place] photorefractive keratectomy; LASIK laser assisted in situ keratomileusis; HTN hypertension; DM diabetes mellitus; COPD chronic obstructive pulmonary disease

## 2021-01-29 NOTE — Assessment & Plan Note (Signed)
No signs of detectable diabetic retinopathy OU

## 2021-01-31 NOTE — Procedures (Signed)
Lumbar Epidural Steroid Injection - Interlaminar Approach with Fluoroscopic Guidance  Patient: Randall Wilkins      Date of Birth: Feb 19, 1941 MRN: [id number] PCP: [name], MD      Visit Date: 12/25/2020   Universal Protocol:     Consent Given By: the patient  Position: PRONE  Additional Comments: Vital signs were monitored before and after the procedure. Patient was prepped and draped in the usual sterile fashion. The correct patient, procedure, and site was verified.   Injection Procedure Details:   Procedure diagnoses: Lumbar radiculopathy [M54.16]   Meds Administered:  Meds ordered this encounter  Medications  . methylPREDNISolone acetate (DEPO-MEDROL) injection 80 mg     Laterality: Right  Location/Site:  L3-L4  Needle: 5 inch 22-gauge spinal needle  Needle Placement: Paramedian epidural  Findings:   -Comments: Excellent flow of contrast into the epidural space.  Procedure Details: Using a paramedian approach from the side mentioned above, the region overlying the inferior lamina was localized under fluoroscopic visualization and the soft tissues overlying this structure were infiltrated with 4 ml. of 1% Lidocaine without Epinephrine. The Tuohy needle was inserted into the epidural space using a paramedian approach.   The epidural space was localized using loss of resistance along with counter oblique bi-planar fluoroscopic views.  After negative aspirate for air, blood, and CSF, a 2 ml. volume of Isovue-250 was injected into the epidural space and the flow of contrast was observed. Radiographs were obtained for documentation purposes.    The injectate was administered into the level noted above.   Additional Comments:  The patient tolerated the procedure well Dressing: 2 x 2 sterile gauze and Band-Aid    Post-procedure details: Patient was observed during the procedure. Post-procedure instructions were reviewed.  Patient left the clinic in stable  condition.

## 2021-01-31 NOTE — Progress Notes (Signed)
Randall Wilkins - 80 y.o. male MRN [id number]  Date of birth: [birth year]  Office Visit Note: Visit Date: 12/25/2020 PCP: [name], MD Referred by: [name], MD  Subjective: Chief Complaint  Patient presents with  . Lower Back - Pain   HPI:  Randall Wilkins is a 80 y.o. male who comes in today At the request of Dr. Cy Blamer for lumbar epidural injection.  Patient had last epidural injection in 2020 at [facility].  This was a right L2-3 interlaminar epidural steroid injection.  Patient unfortunately has been diagnosed with stage IV adenocarcinoma of the lung.  He has multiple imaging studies looking for various findings throughout the abdomen and chest and we were able to review CT scans of the lumbar spine showing levels of multilevel stenosis but nothing severe.  He does have history of disc herniation and lateral recess narrowing in the upper lumbar spine.  This does fit with his pain pattern.  He has tried and failed all manner of other conservative care.  We actually seen the patient remotely in the past for epidural injection.  We elected today to complete a right L3-4 interlaminar epidural steroid injection.  He had appropriately stopped anticoagulation prior to the procedure.  ROS Otherwise per HPI.  Assessment & Plan: Visit Diagnoses:    ICD-10-CM   1. Lumbar radiculopathy  M54.16 XR C-ARM NO REPORT    Epidural Steroid injection    methylPREDNISolone acetate (DEPO-MEDROL) injection 80 mg    Plan: No additional findings.   Meds & Orders:  Meds ordered this encounter  Medications  . methylPREDNISolone acetate (DEPO-MEDROL) injection 80 mg    Orders Placed This Encounter  Procedures  . XR C-ARM NO REPORT  . Epidural Steroid injection    Follow-up: Return if symptoms worsen or fail to improve.   Procedures: No procedures performed  Lumbar Epidural Steroid Injection - Interlaminar Approach with Fluoroscopic Guidance  Patient: Randall Wilkins      Date of Birth: April 03, 1941 MRN: [id number] PCP: [name], MD      Visit Date: 12/25/2020   Universal Protocol:     Consent Given By: the patient  Position: PRONE  Additional Comments: Vital signs were monitored before and after the procedure. Patient was prepped and draped in the usual sterile fashion. The correct patient, procedure, and site was verified.   Injection Procedure Details:   Procedure diagnoses: Lumbar radiculopathy [M54.16]   Meds Administered:  Meds ordered this encounter  Medications  . methylPREDNISolone acetate (DEPO-MEDROL) injection 80 mg     Laterality: Right  Location/Site:  L3-L4  Needle: 5 inch 22-gauge spinal needle  Needle Placement: Paramedian epidural  Findings:   -Comments: Excellent flow of contrast into the epidural space.  Procedure Details: Using a paramedian approach from the side mentioned above, the region overlying the inferior lamina was localized under fluoroscopic visualization and the soft tissues overlying this structure were infiltrated with 4 ml. of 1% Lidocaine without Epinephrine. The Tuohy needle was inserted into the epidural space using a paramedian approach.   The epidural space was localized using loss of resistance along with counter oblique bi-planar fluoroscopic views.  After negative aspirate for air, blood, and CSF, a 2 ml. volume of Isovue-250 was injected into the epidural space and the flow of contrast was observed. Radiographs were obtained for documentation purposes.    The injectate was administered into the level noted above.   Additional Comments:  The patient tolerated the procedure  well Dressing: 2 x 2 sterile gauze and Band-Aid    Post-procedure details: Patient was observed during the procedure. Post-procedure instructions were reviewed.  Patient left the clinic in stable condition.     Clinical History: No specialty comments available.     Objective:  VS:  HT:    WT:    BMI:     BP:124/67  HR:71bpm  TEMP: ( )  RESP:  Physical Exam Vitals and nursing note reviewed.  Constitutional:      General: He is not in acute distress.    Appearance: Normal appearance. He is not ill-appearing.  HENT:     Head: Normocephalic and atraumatic.     Right Ear: External ear normal.     Left Ear: External ear normal.     Nose: No congestion.  Eyes:     Extraocular Movements: Extraocular movements intact.  Cardiovascular:     Rate and Rhythm: Normal rate.     Pulses: Normal pulses.  Pulmonary:     Effort: Pulmonary effort is normal. No respiratory distress.  Abdominal:     General: There is no distension.     Palpations: Abdomen is soft.  Musculoskeletal:        General: No tenderness or signs of injury.     Cervical back: Neck supple.     Right lower leg: No edema.     Left lower leg: No edema.     Comments: Patient has good distal strength without clonus.  Skin:    Findings: No erythema or rash.  Neurological:     General: No focal deficit present.     Mental Status: He is alert and oriented to person, place, and time.     Sensory: No sensory deficit.     Motor: No weakness or abnormal muscle tone.     Coordination: Coordination normal.  Psychiatric:        Mood and Affect: Mood normal.        Behavior: Behavior normal.      Imaging: No results found.

## 2021-02-03 ENCOUNTER — Ambulatory Visit: Payer: Medicare Other | Admitting: Physician Assistant

## 2021-02-03 ENCOUNTER — Inpatient Hospital Stay: Payer: Medicare Other | Admitting: Internal Medicine

## 2021-02-03 ENCOUNTER — Telehealth: Payer: Self-pay | Admitting: Internal Medicine

## 2021-02-03 ENCOUNTER — Other Ambulatory Visit: Payer: Self-pay | Admitting: Internal Medicine

## 2021-02-03 ENCOUNTER — Other Ambulatory Visit: Payer: Medicare Other

## 2021-02-03 ENCOUNTER — Other Ambulatory Visit: Payer: Self-pay

## 2021-02-03 ENCOUNTER — Inpatient Hospital Stay: Payer: Medicare Other

## 2021-02-03 ENCOUNTER — Inpatient Hospital Stay: Payer: Medicare Other | Attending: Internal Medicine

## 2021-02-03 ENCOUNTER — Encounter: Payer: Self-pay | Admitting: Internal Medicine

## 2021-02-03 ENCOUNTER — Ambulatory Visit: Payer: Medicare Other

## 2021-02-03 VITALS — BP 136/60 | HR 80 | Temp 97.8°F | Resp 15 | Ht 67.0 in | Wt 232.7 lb

## 2021-02-03 DIAGNOSIS — C3412 Malignant neoplasm of upper lobe, left bronchus or lung: Secondary | ICD-10-CM | POA: Insufficient documentation

## 2021-02-03 DIAGNOSIS — Z8673 Personal history of transient ischemic attack (TIA), and cerebral infarction without residual deficits: Secondary | ICD-10-CM | POA: Insufficient documentation

## 2021-02-03 DIAGNOSIS — Z794 Long term (current) use of insulin: Secondary | ICD-10-CM | POA: Diagnosis not present

## 2021-02-03 DIAGNOSIS — Z79899 Other long term (current) drug therapy: Secondary | ICD-10-CM | POA: Insufficient documentation

## 2021-02-03 DIAGNOSIS — C3411 Malignant neoplasm of upper lobe, right bronchus or lung: Secondary | ICD-10-CM | POA: Diagnosis not present

## 2021-02-03 DIAGNOSIS — I130 Hypertensive heart and chronic kidney disease with heart failure and stage 1 through stage 4 chronic kidney disease, or unspecified chronic kidney disease: Secondary | ICD-10-CM | POA: Insufficient documentation

## 2021-02-03 DIAGNOSIS — N183 Chronic kidney disease, stage 3 unspecified: Secondary | ICD-10-CM | POA: Diagnosis not present

## 2021-02-03 DIAGNOSIS — I10 Essential (primary) hypertension: Secondary | ICD-10-CM

## 2021-02-03 DIAGNOSIS — E1122 Type 2 diabetes mellitus with diabetic chronic kidney disease: Secondary | ICD-10-CM | POA: Insufficient documentation

## 2021-02-03 DIAGNOSIS — C3491 Malignant neoplasm of unspecified part of right bronchus or lung: Secondary | ICD-10-CM

## 2021-02-03 DIAGNOSIS — C3490 Malignant neoplasm of unspecified part of unspecified bronchus or lung: Secondary | ICD-10-CM

## 2021-02-03 DIAGNOSIS — Z95828 Presence of other vascular implants and grafts: Secondary | ICD-10-CM

## 2021-02-03 DIAGNOSIS — Z5111 Encounter for antineoplastic chemotherapy: Secondary | ICD-10-CM | POA: Diagnosis not present

## 2021-02-03 DIAGNOSIS — Z7901 Long term (current) use of anticoagulants: Secondary | ICD-10-CM | POA: Diagnosis not present

## 2021-02-03 LAB — CBC WITH DIFFERENTIAL (CANCER CENTER ONLY)
Abs Immature Granulocytes: 0.02 10*3/uL (ref 0.00–0.07)
Basophils Absolute: 0 10*3/uL (ref 0.0–0.1)
Basophils Relative: 1 %
Eosinophils Absolute: 0.1 10*3/uL (ref 0.0–0.5)
Eosinophils Relative: 1 %
HCT: 35.7 % — ABNORMAL LOW (ref 39.0–52.0)
Hemoglobin: 10.9 g/dL — ABNORMAL LOW (ref 13.0–17.0)
Immature Granulocytes: 0 %
Lymphocytes Relative: 22 %
Lymphs Abs: 1.2 10*3/uL (ref 0.7–4.0)
MCH: 28.5 pg (ref 26.0–34.0)
MCHC: 30.5 g/dL (ref 30.0–36.0)
MCV: 93.5 fL (ref 80.0–100.0)
Monocytes Absolute: 0.6 10*3/uL (ref 0.1–1.0)
Monocytes Relative: 11 %
Neutro Abs: 3.6 10*3/uL (ref 1.7–7.7)
Neutrophils Relative %: 65 %
Platelet Count: 125 10*3/uL — ABNORMAL LOW (ref 150–400)
RBC: 3.82 MIL/uL — ABNORMAL LOW (ref 4.22–5.81)
RDW: 15.1 % (ref 11.5–15.5)
WBC Count: 5.6 10*3/uL (ref 4.0–10.5)
nRBC: 0 % (ref 0.0–0.2)

## 2021-02-03 LAB — CMP (CANCER CENTER ONLY)
ALT: 10 U/L (ref 0–44)
AST: 16 U/L (ref 15–41)
Albumin: 3.2 g/dL — ABNORMAL LOW (ref 3.5–5.0)
Alkaline Phosphatase: 86 U/L (ref 38–126)
Anion gap: 10 (ref 5–15)
BUN: 20 mg/dL (ref 8–23)
CO2: 27 mmol/L (ref 22–32)
Calcium: 8.8 mg/dL — ABNORMAL LOW (ref 8.9–10.3)
Chloride: 104 mmol/L (ref 98–111)
Creatinine: 1.45 mg/dL — ABNORMAL HIGH (ref 0.61–1.24)
GFR, Estimated: 49 mL/min — ABNORMAL LOW (ref >60)
Glucose, Bld: 178 mg/dL — ABNORMAL HIGH (ref 70–99)
Potassium: 3.9 mmol/L (ref 3.5–5.1)
Sodium: 141 mmol/L (ref 135–145)
Total Bilirubin: 0.4 mg/dL (ref 0.3–1.2)
Total Protein: 6.2 g/dL — ABNORMAL LOW (ref 6.5–8.1)

## 2021-02-03 MED ORDER — HEPARIN SOD (PORK) LOCK FLUSH 100 UNIT/ML IV SOLN
500.0000 [IU] | Freq: Once | INTRAVENOUS | Status: AC
Start: 2021-02-03 — End: 2021-02-03
  Administered 2021-02-03: 500 [IU]
  Filled 2021-02-03: qty 5

## 2021-02-03 MED ORDER — SODIUM CHLORIDE 0.9% FLUSH
10.0000 mL | Freq: Once | INTRAVENOUS | Status: AC
  Administered 2021-02-03: 10 mL
  Filled 2021-02-03: qty 10

## 2021-02-03 NOTE — Telephone Encounter (Signed)
Attempted to call patient, left message for patient to call back to office.   

## 2021-02-03 NOTE — Progress Notes (Signed)
[place] [phone]   [phone]  OFFICE PROGRESS NOTE  [name], MD [address] [place] [address]  DIAGNOSIS: Stage IV (T4, N2, M1 a) non-small cell lung cancer, mucinous adenocarcinoma presented with bilateral large masses in the left upper lobe as well as the right lower lobe in addition to other small nodules in the right upper lobe with hilar and mediastinal lymphadenopathy diagnosed in August 2021.  Biomarker Findings Microsatellite status - MS-Stable Tumor Mutational Burden - 4 Muts/Mb Genomic Findings For a complete list of the genes assayed, please refer to the Appendix. KRAS G12C GNAS R201H 7 Disease relevant genes with no reportable alterations: ALK, BRAF, EGFR, ERBB2, MET, RET, ROS1  PDL1 Expression: 20%.  PRIOR THERAPY: Systemic chemotherapy with carboplatin for AUC of 5, Alimta 500 mg/M2 and Keytruda 200 mg IV every 3 weeks.  First dose 09/09/2020.  Status post 6 cycles.  Starting from cycle #5 he has been on maintenance treatment with Alimta and Keytruda.  CURRENT THERAPY: Lumakras (Sotorasib) 960 mg p.o. daily.  First dose started 01/25/2021.  INTERVAL HISTORY: Randall Wilkins 80 y.o. male returns to the clinic today for follow-up visit accompanied by his wife.  The patient is feeling fine today with no concerning complaints.  He has a fall last week when he tripped on his walker.  The patient is also undergoing PT/OT weekly.  He started treatment with Lumakras (Sotorasib) on January 25, 2021 and has been tolerating it fairly well.  He denied having any nausea, vomiting, diarrhea or constipation.  He has no current chest pain but has shortness of breath with exertion with no cough or hemoptysis.  He denied having any headache or visual changes.  The patient is here today for evaluation and repeat blood work.  MEDICAL HISTORY: Past Medical History:  Diagnosis Date  . Aortic stenosis, mild 11/14/2013   . Aortic valve sclerosis 03/29/2015  . Bilateral leg edema 05/21/2014  . CAD (coronary artery disease)   . Chronic diastolic congestive heart failure ([facility])   . Chronic kidney disease (CKD), stage III (moderate) (H[facility])   . Diabetes 1.5, managed as type 1 ([place]) 02/04/2013  . Diabetic peripheral neuropathy associated with type 1 diabetes mellitus ([place] [facility]) 11/14/2013  . Dysphagia   . Dyspnea on exertion 03/21/2015  . Exogenous obesity   . Gout   . Heart attack ([place])   . History of nuclear stress test 07/2011   dipyridamole; fixed inferolateral defect, worse at stress than rest; no reversible ischemia; low risk scan   . Hyperlipidemia   . Hypertension   . Hypothyroidism   . Insulin dependent diabetes mellitus   . Left foot drop   . Left main coronary artery disease 03/28/2015  . lung ca dx'd 08/2020  . Memory loss   . Obesity (BMI 30-39.9) 11/14/2013  . Obstructive sleep apnea 03/21/2015  . OSA on CPAP    uses a cpap  . Peptic ulcer with hemorrhage 03/28/2015  . Peripheral neuropathy   . Pneumonia   . Rhabdomyolysis   . 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 Stroke ([facility])    L patietal with small scattered lacunar infarcts  . Thrombocytopenia ([facility]) 03/21/2015  . Venous insufficiency   . Weakness generalized 03/21/2015    ALLERGIES:  is allergic to actos [pioglitazone], metformin and related, and niaspan [niacin er].  MEDICATIONS:  Current Outpatient  Medications  Medication Sig Dispense Refill  . albuterol (VENTOLIN HFA) 108 (90 Base) MCG/ACT inhaler Inhale 2 puffs into the lungs every 6 (six) hours as needed for wheezing or shortness of breath. 8 g 6  . allopurinol (ZYLOPRIM) 100 MG tablet Take 1 tablet (100 mg total) by mouth daily. 30 tablet 6  . amitriptyline (ELAVIL) 75 MG tablet Take 75 mg by mouth at bedtime.    Marland Kitchen apixaban (ELIQUIS) 2.5 MG TABS tablet Take 1 tablet (2.5 mg total) by mouth 2 (two) times daily. 180 tablet 3  . Blood Glucose Monitoring  Suppl (CONTOUR NEXT MONITOR) w/Device KIT 1 Device by Does not apply route 3 (three) times daily. Use to check blood sugars 3 times daily. Dx Code E13.9 1 kit 2  . CONTOUR NEXT TEST test strip USE 1 STRIP TO CHECK GLUCOSE 4 TIMES DAILY 300 each 1  . donepezil (ARICEPT) 10 MG tablet Take 1 tablet (10 mg total) by mouth daily. 90 tablet 4  . folic acid (FOLVITE) 1 MG tablet Take 1 tablet (1 mg total) by mouth daily. 30 tablet 4  . furosemide (LASIX) 40 MG tablet Take 1 tablet (40 mg total) by mouth in the morning. 30 tablet 6  . insulin degludec (TRESIBA FLEXTOUCH) 200 UNIT/ML FlexTouch Pen Inject 24 Units into the skin at bedtime. (Patient taking differently: Inject 20 Units into the skin at bedtime. 24 units on Chemo days)    . insulin lispro (HUMALOG) 100 UNIT/ML cartridge Inject 5-15 Units into the skin 3 (three) times daily with meals. Sliding scale.    . Insulin Pen Needle (EASY COMFORT PEN NEEDLES) 33G X 4 MM MISC 1 each by Does not apply route See admin instructions. Use to inject insulin 5 times daily. 150 each 3  . levothyroxine (SYNTHROID, LEVOTHROID) 75 MCG tablet Take 75 mcg by mouth daily before breakfast.   1  . lidocaine (XYLOCAINE) 2 % injection See admin instructions.    . lidocaine (XYLOCAINE) 2 % solution Use as directed 15 mLs in the mouth or throat as needed for mouth pain. 100 mL 0  . lidocaine-prilocaine (EMLA) cream Apply 1 application topically as needed. Apply a teaspoon over port site at least 1 hour prior to lab appt. Do not rub in and cover with plastic wrap. 30 g 0  . lisinopril (ZESTRIL) 10 MG tablet TAKE ONE TABLET DAILY (Patient taking differently: Take 10 mg by mouth daily.) 90 tablet 2  . metFORMIN (GLUCOPHAGE-XR) 750 MG 24 hr tablet TAKE 1 TABLET WITH DINNER 90 tablet 0  . metoprolol succinate (TOPROL-XL) 25 MG 24 hr tablet Take 1 tablet (25 mg total) by mouth daily. 90 tablet 3  . Nutritional Supplements (ENSURE MAX PROTEIN PO) Take by mouth.    . ondansetron  (ZOFRAN) 4 MG tablet Take 1 tablet (4 mg total) by mouth every 6 (six) hours as needed for nausea. 20 tablet 0  . OXYGEN Inhale 2 L into the lungs. At bedtime and during the day prn    . potassium chloride SA (K-DUR,KLOR-CON) 20 MEQ tablet Take 20 mEq by mouth 2 (two) times daily.     . prochlorperazine (COMPAZINE) 10 MG tablet Take 1 tablet (10 mg total) by mouth every 6 (six) hours as needed for nausea or vomiting. 30 tablet 0  . rosuvastatin (CRESTOR) 20 MG tablet Take 20 mg by mouth daily.    . sotorasib (LUMAKRAS) 120 MG TABS Take 960 mg by mouth every morning. 240 tablet 3  .  Tamsulosin HCl (FLOMAX) 0.4 MG CAPS Take 0.4 mg by mouth daily.     . Tiotropium Bromide Monohydrate (SPIRIVA RESPIMAT) 2.5 MCG/ACT AERS Inhale 2 puffs into the lungs daily. 4 g 0  . vitamin B-12 (CYANOCOBALAMIN) 1000 MCG tablet Take 1,000 mcg by mouth daily.     No current facility-administered medications for this visit.    SURGICAL HISTORY:  Past Surgical History:  Procedure Laterality Date  . BACK SURGERY  2002   lumbosacral. 11 back surgeries total  . BRONCHIAL BIOPSY  08/06/2020   Procedure: BRONCHIAL BIOPSIES;  Surgeon: Collene Gobble, MD;  Location: [facility] ENDOSCOPY;  Service: Pulmonary;;  . BRONCHIAL BRUSHINGS  08/06/2020   Procedure: BRONCHIAL BRUSHINGS;  Surgeon: Collene Gobble, MD;  Location: [facility] ENDOSCOPY;  Service: Pulmonary;;  . BRONCHIAL NEEDLE ASPIRATION BIOPSY  08/06/2020   Procedure: BRONCHIAL NEEDLE ASPIRATION BIOPSIES;  Surgeon: Collene Gobble, MD;  Location: [facility] ENDOSCOPY;  Service: Pulmonary;;  . Carotid Doppler  03/2013   bilat bulb/prox ICAs - mild amount of fibrous plaque with no evidence of diameter reduction  . CARPAL TUNNEL RELEASE Bilateral 08/09/2014   Procedure: BILATERAL CARPAL TUNNEL RELEASE;  Surgeon: Daryll Brod, MD;  Location: [facility];  Service: Orthopedics;  Laterality: Bilateral;  ANESTHESIA:  IV REGIONAL BIL FAB  . CHOLECYSTECTOMY    . COLONOSCOPY    . CORONARY  ANGIOPLASTY  10/13/1996  . CORONARY ANGIOPLASTY  09/21/1989   emergency PTCA  . CORONARY ANGIOPLASTY  10/13/1996   Multi-Link diagonal & OD stenting (Dr. Marella Chimes)  . CORONARY ANGIOPLASTY  12/03/1997   disease of mid DX-1 ~50% & in mid PLA & PDA (distal lesions) (Dr. Marella Chimes)   . CORONARY ANGIOPLASTY  10/14/1999   progression of disease distal PLA & PDA; progression of disease prox RCA - moderate (Dr. Marella Chimes)   . CORONARY ANGIOPLASTY WITH STENT PLACEMENT  04/04/2004   4.0x18m non-DES (thrombectomy via AngioJet) to RCA for high grade stenosis (Dr. RMarella Chimes  . CORONARY ARTERY BYPASS GRAFT N/A 03/29/2015   Procedure: CORONARY ARTERY BYPASS GRAFTING TIMES TWO USING LEFT INTERNAL MAMMARY ARTERY AND RIGHT LEG GREATER SAPHENOUS VEIN HARVESTED ENDOSCOPICALLY.;  Surgeon: CRexene Alberts MD;  [facility]  Service: Open Heart Surgery;  Laterality: N/A;  . ESOPHAGOGASTRODUODENOSCOPY N/A 03/27/2015   Procedure: ESOPHAGOGASTRODUODENOSCOPY (EGD);  Surgeon: MClarene Essex MD;  [facility];  Service: Endoscopy;  Laterality: N/A;  possible dilation  . ESOPHAGOGASTRODUODENOSCOPY N/A 10/05/2020   Procedure: ESOPHAGOGASTRODUODENOSCOPY (EGD);  Surgeon: OArta Silence MD;  [facility];  Service: Endoscopy;  Laterality: N/A;  . FINE NEEDLE ASPIRATION  08/06/2020   Procedure: FINE NEEDLE ASPIRATION (FNA) LINEAR;  Surgeon: BCollene Gobble MD;  [facility];  Service: Pulmonary;;  . IR IMAGING GUIDED PORT INSERTION  10/07/2020  . LEFT HEART CATHETERIZATION WITH CORONARY ANGIOGRAM N/A 03/28/2015   Procedure: LEFT HEART CATHETERIZATION WITH CORONARY ANGIOGRAM;  Surgeon: Peter M JMartinique MD;  [facility];  Service: Cardiovascular;  Laterality: N/A;  . SINUS ENDO W/FUSION    . TONSILLECTOMY    . TRANSTHORACIC ECHOCARDIOGRAM  08/08/2013   EF 55-60%, mild conc hypertrophy, grade 1 diastolic dysfunction; AV with mild stenosis; LA & RA mildly dilated  . VIDEO  BRONCHOSCOPY WITH ENDOBRONCHIAL NAVIGATION N/A 08/06/2020   Procedure: VIDEO BRONCHOSCOPY WITH ENDOBRONCHIAL NAVIGATION;  Surgeon: BCollene Gobble MD;  [facility];  Service: Pulmonary;  Laterality: N/A;  . VIDEO BRONCHOSCOPY WITH ENDOBRONCHIAL ULTRASOUND N/A 08/06/2020   Procedure:  VIDEO BRONCHOSCOPY WITH ENDOBRONCHIAL ULTRASOUND;  Surgeon: Collene Gobble, MD;  Location[facility] ENDOSCOPY;  Service: Pulmonary;  Laterality: N/A;    REVIEW OF SYSTEMS:  A comprehensive review of systems was negative except for: Constitutional: positive for fatigue Respiratory: positive for dyspnea on exertion   PHYSICAL EXAMINATION: General appearance: alert, cooperative, fatigued and no distress Head: Normocephalic, without obvious abnormality, atraumatic Neck: no adenopathy, no JVD, supple, symmetrical, trachea midline and thyroid not enlarged, symmetric, no tenderness/mass/nodules Lymph nodes: Cervical, supraclavicular, and axillary nodes normal. Resp: clear to auscultation bilaterally Back: symmetric, no curvature. ROM normal. No CVA tenderness. Cardio: regular rate and rhythm, S1, S2 normal, no murmur, click, rub or gallop GI: soft, non-tender; bowel sounds normal; no masses,  no organomegaly Extremities: extremities normal, atraumatic, no cyanosis or edema  ECOG PERFORMANCE STATUS: 1 - Symptomatic but completely ambulatory  Blood pressure 136/60, pulse 80, temperature 97.8 F (36.6 C), temperature source Tympanic, resp. rate 15, height 5' 7" (1.702 m), weight 232 lb 11.2 oz (105.6 kg), SpO2 99 %.  LABORATORY DATA: Lab Results  Component Value Date   WBC 5.6 02/03/2021   HGB 10.9 (L) 02/03/2021   HCT 35.7 (L) 02/03/2021   MCV 93.5 02/03/2021   PLT 125 (L) 02/03/2021      Chemistry      Component Value Date/Time   NA 142 01/13/2021 0940   K 3.8 01/13/2021 0940   CL 105 01/13/2021 0940   CO2 30 01/13/2021 0940   BUN 26 (H) 01/13/2021 0940   CREATININE 1.49 (H) 01/13/2021 0940    CREATININE 1.30 (H) 04/30/2020 1044      Component Value Date/Time   CALCIUM 9.1 01/13/2021 0940   ALKPHOS 87 01/13/2021 0940   AST 15 01/13/2021 0940   ALT 10 01/13/2021 0940   BILITOT 0.5 01/13/2021 0940       RADIOGRAPHIC STUDIES: CT Chest W Contrast  Result Date: 01/10/2021 CLINICAL DATA:  Restaging non-small cell lung cancer, chemotherapy in progress. EXAM: CT CHEST, ABDOMEN, AND PELVIS WITH CONTRAST TECHNIQUE: Multidetector CT imaging of the chest, abdomen and pelvis was performed following the standard protocol during bolus administration of intravenous contrast. CONTRAST:  13m OMNIPAQUE IOHEXOL 300 MG/ML  SOLN COMPARISON:  Multiple exams, including 11/08/2020 FINDINGS: CT CHEST FINDINGS Cardiovascular: Right Port-A-Cath tip: Right atrium. Coronary, aortic arch, and branch vessel atherosclerotic vascular disease. Prior CABG. Mild cardiomegaly. Suspected flow related phenomenon in the right lower lobe pulmonary arteries which demonstrate higher density, potentially related to vascular shunting around the right-sided tumor or slower drainage of this region. The appearance is not characteristic for pulmonary embolus. A similar appearance was present on 11/08/2020. Aortic valve calcifications noted. Mediastinum/Nodes: Hypodense 1.6 cm right thyroid nodule in the setting of other thyroid nodules, previously evaluated on prior thyroid ultrasound of 2013. This has been evaluated on previous imaging. (ref: J Am Coll Radiol. 2015 Feb;12(2): 143-50).Right paratracheal node 1.1 cm in short axis on image 25 series 2, stable. Lungs/Pleura: Trace left pleural effusion reduced in size compared to prior. Small to moderate right pleural effusion appear stable. Mildly increased airspace opacity around the periphery of the posterior right upper lobe mass. Small amount of central cavitation. The mass itself is difficult to measure separate from the surrounding airspace opacity making measurements less reliable,  but subjectively appears grossly similar to prior. The consolidation in the right lower lobe likewise appears similar to prior. Similarly the left upper lobe mass is obscured by surrounding airspace opacity and accordingly difficult to measure, but  the degree of surrounding airspace opacity is increased from previous. This could relate to postobstructive pneumonitis but could also be from radiation pneumonitis, correlate with radiation therapy history. There is some increased nodular opacity in the left lower lobe including a pleural-based 2.2 by 1.7 cm nodule on image 109 of series 6, previously about 1.3 by 1.1 cm. Local consolidation with possible associated nodule medially in the left lower lobe measures 4.4 by 2.2 cm on image 95 of series 6, previously 2.4 by 1.3 cm. Additional nodularity in the posterior basal segment left lower lobe noted, new from previous. Musculoskeletal: Thoracic spondylosis. Lower cervical plate and screw fixator. Probable sebaceous cyst in the subcutaneous tissues posterior to the left shoulder on image 9 of series 2, stable. CT ABDOMEN PELVIS FINDINGS Hepatobiliary: Cholecystectomy.  No discrete liver mass identified. Pancreas: Unremarkable Spleen: Unremarkable Adrenals/Urinary Tract: Both adrenal glands appear normal. Right kidney lower pole cyst. Enhancing 2.0 cm left mid kidney mass on image 92 of series 4 and image 25 of series 7 common no change from prior. Stomach/Bowel: Unremarkable Vascular/Lymphatic: Aortoiliac atherosclerotic vascular disease. Reproductive: Mild prostatomegaly. Other: No supplemental non-categorized findings. Musculoskeletal: Stable soft tissue mass along the left gluteal musculature posterior to the sacrum with speckled associated calcifications, smaller lesion on the right. No bony destructive findings. Mild calcification in the hamstring tendons. Levoconvex lumbar scoliosis with rotary component. Bridging spurring of the left sacroiliac joint. IMPRESSION:  1. The upper lobe lung mass is are roughly similar in size but have an increased degree of surrounding consolidation compared to previous. Some of this could certainly be from radiation pneumonitis. Adjacent loculated pleural fluid is also noted. However, there is some increase in nodularity in the left lower lobe and progressive metastatic disease to the left lower lobe is not excluded. Infectious nodularity in the left lower lobe is a differential diagnostic consideration. 2. Stable consolidation in the right lower lobe. 3. Stable small to moderate right pleural effusion. Trace left pleural effusion reduced in size compared to prior. 4. Stable 2.0 cm enhancing left mid kidney mass, suspicious for renal cell carcinoma. 5. Stable soft tissue mass along the left gluteal musculature posterior to the sacrum with speckled associated calcifications. This could be an unusual region of fat necrosis but merit surveillance. 6. Other imaging findings of potential clinical significance: Coronary, aortic arch, and branch vessel atherosclerotic vascular disease. Mild cardiomegaly. Prior CABG. Aortic valve calcifications. Mild prostatomegaly. Levoconvex lumbar scoliosis with rotary component. Bridging spurring of the left sacroiliac joint. 7. Aortic atherosclerosis. Aortic Atherosclerosis (ICD10-I70.0). Electronically Signed   By: Van Clines M.D.   On: 01/10/2021 18:44   CT Abdomen Pelvis W Contrast  Result Date: 01/10/2021 CLINICAL DATA:  Restaging non-small cell lung cancer, chemotherapy in progress. EXAM: CT CHEST, ABDOMEN, AND PELVIS WITH CONTRAST TECHNIQUE: Multidetector CT imaging of the chest, abdomen and pelvis was performed following the standard protocol during bolus administration of intravenous contrast. CONTRAST:  78m OMNIPAQUE IOHEXOL 300 MG/ML  SOLN COMPARISON:  Multiple exams, including 11/08/2020 FINDINGS: CT CHEST FINDINGS Cardiovascular: Right Port-A-Cath tip: Right atrium. Coronary, aortic arch,  and branch vessel atherosclerotic vascular disease. Prior CABG. Mild cardiomegaly. Suspected flow related phenomenon in the right lower lobe pulmonary arteries which demonstrate higher density, potentially related to vascular shunting around the right-sided tumor or slower drainage of this region. The appearance is not characteristic for pulmonary embolus. A similar appearance was present on 11/08/2020. Aortic valve calcifications noted. Mediastinum/Nodes: Hypodense 1.6 cm right thyroid nodule in the setting of other  thyroid nodules, previously evaluated on prior thyroid ultrasound of 2013. This has been evaluated on previous imaging. (ref: J Am Coll Radiol. 2015 Feb;12(2): 143-50).Right paratracheal node 1.1 cm in short axis on image 25 series 2, stable. Lungs/Pleura: Trace left pleural effusion reduced in size compared to prior. Small to moderate right pleural effusion appear stable. Mildly increased airspace opacity around the periphery of the posterior right upper lobe mass. Small amount of central cavitation. The mass itself is difficult to measure separate from the surrounding airspace opacity making measurements less reliable, but subjectively appears grossly similar to prior. The consolidation in the right lower lobe likewise appears similar to prior. Similarly the left upper lobe mass is obscured by surrounding airspace opacity and accordingly difficult to measure, but the degree of surrounding airspace opacity is increased from previous. This could relate to postobstructive pneumonitis but could also be from radiation pneumonitis, correlate with radiation therapy history. There is some increased nodular opacity in the left lower lobe including a pleural-based 2.2 by 1.7 cm nodule on image 109 of series 6, previously about 1.3 by 1.1 cm. Local consolidation with possible associated nodule medially in the left lower lobe measures 4.4 by 2.2 cm on image 95 of series 6, previously 2.4 by 1.3 cm. Additional  nodularity in the posterior basal segment left lower lobe noted, new from previous. Musculoskeletal: Thoracic spondylosis. Lower cervical plate and screw fixator. Probable sebaceous cyst in the subcutaneous tissues posterior to the left shoulder on image 9 of series 2, stable. CT ABDOMEN PELVIS FINDINGS Hepatobiliary: Cholecystectomy.  No discrete liver mass identified. Pancreas: Unremarkable Spleen: Unremarkable Adrenals/Urinary Tract: Both adrenal glands appear normal. Right kidney lower pole cyst. Enhancing 2.0 cm left mid kidney mass on image 92 of series 4 and image 25 of series 7 common no change from prior. Stomach/Bowel: Unremarkable Vascular/Lymphatic: Aortoiliac atherosclerotic vascular disease. Reproductive: Mild prostatomegaly. Other: No supplemental non-categorized findings. Musculoskeletal: Stable soft tissue mass along the left gluteal musculature posterior to the sacrum with speckled associated calcifications, smaller lesion on the right. No bony destructive findings. Mild calcification in the hamstring tendons. Levoconvex lumbar scoliosis with rotary component. Bridging spurring of the left sacroiliac joint. IMPRESSION: 1. The upper lobe lung mass is are roughly similar in size but have an increased degree of surrounding consolidation compared to previous. Some of this could certainly be from radiation pneumonitis. Adjacent loculated pleural fluid is also noted. However, there is some increase in nodularity in the left lower lobe and progressive metastatic disease to the left lower lobe is not excluded. Infectious nodularity in the left lower lobe is a differential diagnostic consideration. 2. Stable consolidation in the right lower lobe. 3. Stable small to moderate right pleural effusion. Trace left pleural effusion reduced in size compared to prior. 4. Stable 2.0 cm enhancing left mid kidney mass, suspicious for renal cell carcinoma. 5. Stable soft tissue mass along the left gluteal musculature  posterior to the sacrum with speckled associated calcifications. This could be an unusual region of fat necrosis but merit surveillance. 6. Other imaging findings of potential clinical significance: Coronary, aortic arch, and branch vessel atherosclerotic vascular disease. Mild cardiomegaly. Prior CABG. Aortic valve calcifications. Mild prostatomegaly. Levoconvex lumbar scoliosis with rotary component. Bridging spurring of the left sacroiliac joint. 7. Aortic atherosclerosis. Aortic Atherosclerosis (ICD10-I70.0). Electronically Signed   By: Van Clines M.D.   On: 01/10/2021 18:44   OCT, Retina - OU - Both Eyes  Result Date: 01/29/2021 Right Eye Quality was good. Scan locations included  subfoveal. Central Foveal Thickness: 297. Progression has no prior data. Findings include epiretinal membrane. Left Eye Quality was good. Scan locations included subfoveal. Central Foveal Thickness: 314. Progression has no prior data. Findings include epiretinal membrane. Notes Severe retinal thickening of the temporal aspect of the fovea and into the FAZ OD OS with minor epiretinal membrane in the temporal aspect of the macula, no foveal distortion   ASSESSMENT AND PLAN: This is a very pleasant 80 years old white male diagnosed with a stage IV (T4, N2, M1 a) non-small cell lung cancer, mucinous adenocarcinoma presented with bilateral large masses in the left upper lobe as well as the right upper lobe in addition to other pulmonary nodules in the right upper lobe with hilar and mediastinal lymphadenopathy diagnosed in August 2021. Molecular study showed positive KRAS G12C mutation and PD-L1 expression of 20%. The patient is currently undergoing systemic chemotherapy with carboplatin for AUC of 5, Alimta 500 mg/M2 and Keytruda 200 mg IV every 3 weeks status post 6 cycles. Starting from cycle #5 the patient is on maintenance treatment with Alimta and Keytruda every 3 weeks.  His treatment was discontinued secondary to  disease progression.  The patient is currently on oral treatment with Lumakras (Sotorasib) 960 mg p.o. daily started on 01/25/2021.  He tolerated the first 10 days of his treatment well with no concerning adverse effects.  He denied having any bruising or gastrointestinal problem. I recommended for the patient to continue his current treatment with Lumakras (Sotorasib) with the same dose. I will see him back for follow-up visit in 2 weeks for evaluation and repeat blood work. The patient was advised to call immediately if he has any other concerning symptoms in the interval. The patient voices understanding of current disease status and treatment options and is in agreement with the current care plan.  All questions were answered. The patient knows to call the clinic with any problems, questions or concerns. We can certainly see the patient much sooner if necessary.  Disclaimer: This note was dictated with voice recognition software. Similar sounding words can inadvertently be transcribed and may not be corrected upon review.

## 2021-02-03 NOTE — Telephone Encounter (Signed)
Patient's wife returning call. 

## 2021-02-03 NOTE — Telephone Encounter (Signed)
Returned the call to the patient's spouse. She stated that the patient has become more short winded then normal. He now wears 2 liters of oxygen at home. Whenever he takes it off to shower, the wife stated that he can barely breath and turns white. He denies chest pain and shortness of breath at rest.   He did see the provider at  the cancer center today: blood pressure 136/60, pulse 80   His home health nurse stated that today it sounded like his heart was skipping.   He is currently taking Metoprolol 25 mg once daily.  The wife is concerned that he could be in afib.

## 2021-02-03 NOTE — Telephone Encounter (Signed)
Scheduled appointments per 2/21 los. Spoke to patient who is aware of appointments date and times. Gave patient calendar print out.

## 2021-02-03 NOTE — Telephone Encounter (Signed)
Pt c/o Shortness Of Breath: STAT if SOB developed within the last 24 hours or pt is noticeably SOB on the phone  1. Are you currently SOB (can you hear that pt is SOB on the phone)? No   2. How long have you been experiencing SOB?   Patient's wife states the patient is always SOB due to stage 4 lung cancer, but over the past few weeks the patient has been extremely SOB with exertion. She states the cancer center put the patient on oxygen.  3. Are you SOB when sitting or when up moving around? When up and moving around   4. Are you currently experiencing any other symptoms? No

## 2021-02-04 ENCOUNTER — Other Ambulatory Visit: Payer: Self-pay | Admitting: Medical Oncology

## 2021-02-04 NOTE — Telephone Encounter (Signed)
Left a message for the patient to call back to offer an appointment. One has been held with Dr. Debara Pickett on 2/24 at 9:45.

## 2021-02-04 NOTE — Telephone Encounter (Signed)
Could be afib - he is on eliquis and metoprolol. Probably would not be aggressive about cardioversion given his other issues. Worsening shortness of breath could be many things - possibly CHF. If they wish to be seen, we could offer them an appt with me or APP.  Dr Lemmie Evens

## 2021-02-04 NOTE — Telephone Encounter (Signed)
Appointment made for 2/24

## 2021-02-05 ENCOUNTER — Encounter (INDEPENDENT_AMBULATORY_CARE_PROVIDER_SITE_OTHER): Payer: Self-pay

## 2021-02-05 ENCOUNTER — Other Ambulatory Visit: Payer: Self-pay

## 2021-02-05 ENCOUNTER — Ambulatory Visit (INDEPENDENT_AMBULATORY_CARE_PROVIDER_SITE_OTHER): Payer: Medicare Other

## 2021-02-05 DIAGNOSIS — H35371 Puckering of macula, right eye: Secondary | ICD-10-CM

## 2021-02-05 MED ORDER — OFLOXACIN 0.3 % OP SOLN: 1.0000 [drp] | OPHTHALMIC | 0 refills | Status: AC

## 2021-02-05 MED ORDER — PREDNISOLONE ACETATE 1 % OP SUSP: 1.0000 [drp] | Freq: Four times a day (QID) | OPHTHALMIC | 0 refills | Status: DC

## 2021-02-06 ENCOUNTER — Ambulatory Visit: Payer: Medicare Other | Admitting: Internal Medicine

## 2021-02-06 ENCOUNTER — Telehealth: Payer: Self-pay | Admitting: Emergency Medicine

## 2021-02-06 VITALS — BP 178/78 | HR 66 | Ht 67.0 in | Wt 232.0 lb

## 2021-02-06 DIAGNOSIS — D62 Acute posthemorrhagic anemia: Secondary | ICD-10-CM | POA: Diagnosis not present

## 2021-02-06 DIAGNOSIS — I2510 Atherosclerotic heart disease of native coronary artery without angina pectoris: Secondary | ICD-10-CM | POA: Diagnosis not present

## 2021-02-06 DIAGNOSIS — I5032 Chronic diastolic (congestive) heart failure: Secondary | ICD-10-CM | POA: Diagnosis not present

## 2021-02-06 DIAGNOSIS — R0609 Other forms of dyspnea: Secondary | ICD-10-CM

## 2021-02-06 DIAGNOSIS — C3491 Malignant neoplasm of unspecified part of right bronchus or lung: Secondary | ICD-10-CM

## 2021-02-06 DIAGNOSIS — M109 Gout, unspecified: Secondary | ICD-10-CM | POA: Diagnosis not present

## 2021-02-06 DIAGNOSIS — N1831 Chronic kidney disease, stage 3a: Secondary | ICD-10-CM | POA: Diagnosis not present

## 2021-02-06 DIAGNOSIS — G4733 Obstructive sleep apnea (adult) (pediatric): Secondary | ICD-10-CM | POA: Diagnosis not present

## 2021-02-06 DIAGNOSIS — R0602 Shortness of breath: Secondary | ICD-10-CM

## 2021-02-06 DIAGNOSIS — K2091 Esophagitis, unspecified with bleeding: Secondary | ICD-10-CM | POA: Diagnosis not present

## 2021-02-06 DIAGNOSIS — E039 Hypothyroidism, unspecified: Secondary | ICD-10-CM | POA: Diagnosis not present

## 2021-02-06 DIAGNOSIS — E785 Hyperlipidemia, unspecified: Secondary | ICD-10-CM | POA: Diagnosis not present

## 2021-02-06 DIAGNOSIS — I350 Nonrheumatic aortic (valve) stenosis: Secondary | ICD-10-CM | POA: Diagnosis not present

## 2021-02-06 DIAGNOSIS — R0600 Dyspnea, unspecified: Secondary | ICD-10-CM | POA: Diagnosis not present

## 2021-02-06 DIAGNOSIS — E1351 Other specified diabetes mellitus with diabetic peripheral angiopathy without gangrene: Secondary | ICD-10-CM | POA: Diagnosis not present

## 2021-02-06 DIAGNOSIS — E1322 Other specified diabetes mellitus with diabetic chronic kidney disease: Secondary | ICD-10-CM | POA: Diagnosis not present

## 2021-02-06 DIAGNOSIS — E1342 Other specified diabetes mellitus with diabetic polyneuropathy: Secondary | ICD-10-CM | POA: Diagnosis not present

## 2021-02-06 DIAGNOSIS — I872 Venous insufficiency (chronic) (peripheral): Secondary | ICD-10-CM | POA: Diagnosis not present

## 2021-02-06 DIAGNOSIS — I130 Hypertensive heart and chronic kidney disease with heart failure and stage 1 through stage 4 chronic kidney disease, or unspecified chronic kidney disease: Secondary | ICD-10-CM | POA: Diagnosis not present

## 2021-02-06 DIAGNOSIS — I480 Paroxysmal atrial fibrillation: Secondary | ICD-10-CM | POA: Diagnosis not present

## 2021-02-06 MED ORDER — SPIRIVA RESPIMAT 2.5 MCG/ACT IN AERS: 2.0000 | INHALATION_SPRAY | Freq: Every day | RESPIRATORY_TRACT | 3 refills | Status: DC

## 2021-02-06 NOTE — Telephone Encounter (Signed)
Call made to patient, spoke with his wife, confirmed DOB. Requesting a prescription for spiriva respimat be sent to his pharmacy. She reports he was given a sample and was told to call back if it helped. Confirmed pharmacy. Medication sent in. Voiced understanding.   Nothing further needed at this time.

## 2021-02-06 NOTE — Progress Notes (Signed)
OFFICE NOTE  Chief Complaint:  Dyspnea  Primary Care Physician: [name], MD  HPI:  Randall Wilkins is a pleasant 80 year old male patient who was formerly followed by Dr. Rollene Fare. He is here to establish cardiac care with [place] today. He has a history of obesity, multiple comorbidities including insulin-dependent diabetes, diabetic peripheral neuropathy, diabetic nephropathy, venous insufficiency and peripheral neuropathy. He also has obstructive sleep apnea on CPAP and coronary artery disease. He unfortunately suffered a left parietal stroke with small lacunar infarcts noted on MRI in 2013. He is a left foot drop from an L4/L5 neuropathy and wears a brace for this. From a cardiac standpoint he had a large non-drug-eluting stent placed in the RCA in 2005. He has had a stent placed at 89 to a diagonal which was noted to be patent. He's had some problems with dizziness which is improved. He [place] as large to medium secondary to neuropathy and venous insufficiency. Unfortunately not been able to lose a lot of weight due to his difficulty in ambulating and painful neuropathy.  He denies any cardiac chest pain or worsening shortness of breath.  Randall Wilkins returns today for a six-month followup. He is having no complaints. He does report some lower extremity swelling which is stable. He has mild aortic stenosis which is been stable as well. His last echo was in August 2014. He denies any chest pain or worsening shortness of breath. He recently had lab work to Dr. Eugenio Hoes office which we will obtain.  I saw Randall Wilkins back today for hospital follow-up. He is reportedly doing much better. He has more energy and no significant chest discomfort. Between our last appointment see presented with a urinary tract infection and ultimately had chest pain and profuse diaphoresis. He was found to have elevated troponin and underwent cardiac catheterization. This demonstrated the following:  Coronary  angiography: Coronary dominance: right  Left mainstem: 95% distal left main stenosis.  Left anterior descending (LAD): There is 95% ostial LAD stenosis. The LAD is a large vessel. The LAD and diagonal otherwise are without significant disease.  There is a large ramus intermediate branch with 99% ostial stenosis.  Left circumflex (LCx): 100% ostial occlusion with faint left to left collaterals.  Right coronary artery (RCA): the RCA is a large dominant branch. The stent in the proximal vessel is widely patent. There is a 60% stenosis in the mid PDA. The third PLOM is moderate in size with a long 90-95% stenosis prior to distal arborization.   Subsequently he underwent emergent coronary artery bypass grafting with a LIMA to LAD and SVG to OM branch. He is noted to have mild aortic sclerosis versus stenosis, however the valve was not severe enough to be addressed. Since discharge he is more active and is starting cardiac rehabilitation today.  Randall Wilkins returns today for follow-up. Overall he is feeling well and continues to improve in his ambulation and stamina. He denies any chest pain or shortness of breath. He is rehabilitating well. Recent laboratory work shows excellent cholesterol control and total cholesterol 130, HDL 42 LDL 61 and triglycerides 137. His only concern is the cost of medications.  I saw Randall Wilkins back today in the office. He denies any chest pain or worsening shortness of breath. Unfortunately he's had about several weeks of diarrhea which started when he went to the [facility] area. It was not felt to be infectious any seeing a gastroenterologist about this. Otherwise he is without cardiac complaints.  Blood pressure today was low at 114/52. He's managed to lose only a couple of pounds but generally his appetite stools been pretty good despite the diarrhea. He is planning on having an upcoming injection and I received a request to hold his Plavix for 5 days prior to that. He  is currently holding it will have his injection on Monday. Based on this I reviewed his records and I do not see a clear ongoing indication for the Plavix in addition to aspirin. Now that he's been surgically revascularized.   03/04/2017  Randall Wilkins returns today for follow-up. Recently he's had some dizziness and apparently had lower blood pressure. He backed off on his lisinopril to 2.5 mg daily, or at least he says he is taking a half tablet. Will try to figure out what the dose was. EKG today shows sinus bradycardia with either a first degree AV block of great significance or possibly atrial fibrillation. It's difficult to see consistent P waves with normal PP intervals. The RR intervals are slightly irregular. He has had sinus with a long first-degree AV block of 340 ms in the past. This is important to determine as he has had prior stroke and would necessitate anticoagulation.  03/23/2018  Randall Wilkins returns today for follow-up.  He has no specific complaints.  He continues to have a little instability with his gait.  He is using a cane today but has a walker at home.  Recently he has had the best blood sugars that he has had in a while.  He started seeing Dr. Dwyane Dee, who added Victoza.  He is also on [place] and Humalog.  He was in a clinical trial with Jardiance, and is to be considered given his history of coronary disease and diabetes for mortality benefit.  I will defer to Dr. Dwyane Dee.  Blood pressure is well controlled today.  Weight remains excessive.  EKG personally reviewed shows sinus rhythm with PVCs for which he is unaware of.  02/07/2019  Randall Wilkins returns today for follow-up.  He is done well and has no new complaints.  Recently seen by Dr. Claiborne Billings and adjustments were made to his CPAP.  He is reportedly 100% compliant.  He was noted to have a type I winky block on EKG a couple weeks ago and his Toprol-XL was reduced from 37.5 to 25 mg.  He seems to be tolerating this however blood pressures  accordingly gone up.  Blood pressure today was 155/71 and home blood pressures are in the 150s.  03/13/2020  Randall Wilkins is seen today in follow-up.  He is accompanied by his wife.  He scheduled to see Dr. Claiborne Billings in a couple weeks.  Recently he has been struggling with shortness of breath and fatigue.  He saw his primary care provider and diagnosed with a pneumonia.  He has been on antibiotics and does seem to be improving.  He has somewhat of a productive cough.  An EKG was performed today and shows atrial fibrillation controlled ventricular response.  This is newly documented.  He does not have known history of this although had had a prior stroke.  He is on low-dose aspirin.  05/03/2020  Randall Wilkins is seen today in follow-up.  He just got back from the beach.  He remains in A. fib.  Fortunately his pneumonia is not resolving.  Repeat imaging still suggest multifocal infiltrates and his PCP apparently is going to refer him to pulmonary.  I suspect he may end up getting bronchoscopy or other  procedures.  This will complicate things if he were to have a cardioversion, particularly I would not want to stop his anticoagulation to allow for those procedures within a month after it.  At this point he remains in A. fib today but is rate controlled.  He has been fatigued again I think this is related his A. fib and his pneumonias.  He is essentially nonambulatory and difficult for his wife to push around.  They have asked about a powered wheelchair.  07/08/2020  Randall Wilkins returns today for follow-up.  He appears to be an sinus rhythm with Mobitz 1 AV block.  He had this previously and has had some intermittent A. fib however there is no indication for cardioversion.  He is anticoagulated on Eliquis.  His breathing is worsened and recently had a super D CT scan of the chest ordered by Dr. Lamonte Sakai.  Unfortunate this shows a large 5 cm cavitary lung mass in the right lower lobe as well as some persistent lung mass in the left upper  lobe.  Is not clear to me whether this is tumor versus a possible pneumonic process.  He also reported that they had had some remodeling done in her house and they found extensive amounts of mold in the kitchen.  02/06/2021  Randall Wilkins is seen today in the office for follow-up.  He has had some progressive dyspnea.  EKG today shows what appears to be a long first-degree AV block, probably sinus rhythm with a Mobitz 1.  Looking back he had an EKG in October which is read as A. fib but appears to be regular with a long first-degree AV block.  His initial diagnosis of A. fib I think came from me earlier last year with an EKG in the office that was questionable for A. fib but also probably showed a long first-degree AV block.  Nevertheless he remains on Eliquis 2.5 mg twice daily.  His dyspnea has progressed somewhat.  He saw pulmonary and was started on Spiriva which may have improved a little.  He also has extensive lung cancer which may be causing his dyspnea.  He has reported a little bit of leg swelling.  Echo last year showed an EF of 48% with mild to moderate aortic stenosis.  PMHx:  Past Medical History:  Diagnosis Date  . Aortic stenosis, mild 11/14/2013  . Aortic valve sclerosis 03/29/2015  . Bilateral leg edema 05/21/2014  . CAD (coronary artery disease)   . Chronic diastolic congestive heart failure ([place])   . Chronic kidney disease (CKD), stage III (moderate) (HCC)   . Diabetes 1.5, managed as type 1 ([place]) 02/04/2013  . Diabetic peripheral neuropathy associated with type 1 diabetes mellitus ([place]) 11/14/2013  . Dysphagia   . Dyspnea on exertion 03/21/2015  . Exogenous obesity   . Gout   . Heart attack ([place])   . History of nuclear stress test 07/2011   dipyridamole; fixed inferolateral defect, worse at stress than rest; no reversible ischemia; low risk scan   . Hyperlipidemia   . Hypertension   . Hypothyroidism   . Insulin dependent diabetes mellitus   . Left foot drop   . Left main coronary  artery disease 03/28/2015  . lung ca dx'd 08/2020  . Memory loss   . Obesity (BMI 30-39.9) 11/14/2013  . Obstructive sleep apnea 03/21/2015  . OSA on CPAP    uses a cpap  . Peptic ulcer with hemorrhage 03/28/2015  . Peripheral neuropathy   . Pneumonia   .  Rhabdomyolysis   . 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 Stroke ([place])    L patietal with small scattered lacunar infarcts  . Thrombocytopenia ([place]) 03/21/2015  . Venous insufficiency   . Weakness generalized 03/21/2015    Past Surgical History:  Procedure Laterality Date  . BACK SURGERY  2002   lumbosacral. 11 back surgeries total  . BRONCHIAL BIOPSY  08/06/2020   Procedure: BRONCHIAL BIOPSIES;  Surgeon: Collene Gobble, MD;  Location[facility];  Service: Pulmonary;;  . BRONCHIAL BRUSHINGS  08/06/2020   Procedure: BRONCHIAL BRUSHINGS;  Surgeon: Collene Gobble, MD;  Location: [facility] ENDOSCOPY;  Service: Pulmonary;;  . BRONCHIAL NEEDLE ASPIRATION BIOPSY  08/06/2020   Procedure: BRONCHIAL NEEDLE ASPIRATION BIOPSIES;  Surgeon: Collene Gobble, MD;  Location: [facility] ENDOSCOPY;  Service: Pulmonary;;  . Carotid Doppler  03/2013   bilat bulb/prox ICAs - mild amount of fibrous plaque with no evidence of diameter reduction  . CARPAL TUNNEL RELEASE Bilateral 08/09/2014   Procedure: BILATERAL CARPAL TUNNEL RELEASE;  Surgeon: Daryll Brod, MD;  Location: [place];  Service: Orthopedics;  Laterality: Bilateral;  ANESTHESIA:  IV REGIONAL BIL FAB  . CHOLECYSTECTOMY    . COLONOSCOPY    . CORONARY ANGIOPLASTY  10/13/1996  . CORONARY ANGIOPLASTY  09/21/1989   emergency PTCA  . CORONARY ANGIOPLASTY  10/13/1996   Multi-Link diagonal & OD stenting (Dr. Marella Chimes)  . CORONARY ANGIOPLASTY  12/03/1997   disease of mid DX-1 ~50% & in mid PLA & PDA (distal lesions) (Dr. Marella Chimes)   . CORONARY ANGIOPLASTY  10/14/1999   progression of disease distal PLA & PDA; progression of disease prox RCA - moderate (Dr.  Marella Chimes)   . CORONARY ANGIOPLASTY WITH STENT PLACEMENT  04/04/2004   4.0x64m non-DES (thrombectomy via AngioJet) to RCA for high grade stenosis (Dr. RMarella Chimes  . CORONARY ARTERY BYPASS GRAFT N/A 03/29/2015   Procedure: CORONARY ARTERY BYPASS GRAFTING TIMES TWO USING LEFT INTERNAL MAMMARY ARTERY AND RIGHT LEG GREATER SAPHENOUS VEIN HARVESTED ENDOSCOPICALLY.;  Surgeon: CRexene Alberts MD;  [place]  Service: Open Heart Surgery;  Laterality: N/A;  . ESOPHAGOGASTRODUODENOSCOPY N/A 03/27/2015   Procedure: ESOPHAGOGASTRODUODENOSCOPY (EGD);  Surgeon: MClarene Essex MD;  [facility];  Service: Endoscopy;  Laterality: N/A;  possible dilation  . ESOPHAGOGASTRODUODENOSCOPY N/A 10/05/2020   Procedure: ESOPHAGOGASTRODUODENOSCOPY (EGD);  Surgeon: OArta Silence MD;  [facility];  Service: Endoscopy;  Laterality: N/A;  . FINE NEEDLE ASPIRATION  08/06/2020   Procedure: FINE NEEDLE ASPIRATION (FNA) LINEAR;  Surgeon: BCollene Gobble MD;  [facility];  Service: Pulmonary;;  . IR IMAGING GUIDED PORT INSERTION  10/07/2020  . LEFT HEART CATHETERIZATION WITH CORONARY ANGIOGRAM N/A 03/28/2015   Procedure: LEFT HEART CATHETERIZATION WITH CORONARY ANGIOGRAM;  Surgeon: Peter M JMartinique MD;  [facility];  Service: Cardiovascular;  Laterality: N/A;  . SINUS ENDO W/FUSION    . TONSILLECTOMY    . TRANSTHORACIC ECHOCARDIOGRAM  08/08/2013   EF 55-60%, mild conc hypertrophy, grade 1 diastolic dysfunction; AV with mild stenosis; LA & RA mildly dilated  . VIDEO BRONCHOSCOPY WITH ENDOBRONCHIAL NAVIGATION N/A 08/06/2020   Procedure: VIDEO BRONCHOSCOPY WITH ENDOBRONCHIAL NAVIGATION;  Surgeon: BCollene Gobble MD;  [facility];  Service: Pulmonary;  Laterality: N/A;  . VIDEO BRONCHOSCOPY WITH ENDOBRONCHIAL ULTRASOUND N/A 08/06/2020   Procedure: VIDEO BRONCHOSCOPY WITH ENDOBRONCHIAL ULTRASOUND;  Surgeon: BCollene Gobble MD;  [facility];  Service: Pulmonary;   Laterality: N/A;    FAMHx:  Family History  Problem Relation Age of Onset  . Heart disease Mother   . Coronary artery disease Father   . Cancer Maternal Grandmother   . Heart Problems Maternal Grandfather   . Diabetes Son        borderline     SOCHx:   reports that he quit smoking about 47 years ago. His smoking use included cigarettes. He has a 40.00 pack-year smoking history. He has never used smokeless tobacco. He reports that he does not drink alcohol and does not use drugs.  ALLERGIES:  Allergies  Allergen Reactions  . Actos [Pioglitazone] Swelling  . Metformin And Related Nausea Only  . Niaspan [Niacin Er] Itching and Rash    ROS: Pertinent items noted in HPI and remainder of comprehensive ROS otherwise negative.  HOME MEDS: Current Outpatient Medications  Medication Sig Dispense Refill  . albuterol (VENTOLIN HFA) 108 (90 Base) MCG/ACT inhaler Inhale 2 puffs into the lungs every 6 (six) hours as needed for wheezing or shortness of breath. 8 g 6  . allopurinol (ZYLOPRIM) 100 MG tablet Take 1 tablet (100 mg total) by mouth daily. 30 tablet 6  . amitriptyline (ELAVIL) 75 MG tablet Take 75 mg by mouth at bedtime.    Marland Kitchen apixaban (ELIQUIS) 2.5 MG TABS tablet Take 1 tablet (2.5 mg total) by mouth 2 (two) times daily. 180 tablet 3  . Blood Glucose Monitoring Suppl (CONTOUR NEXT MONITOR) w/Device KIT 1 Device by Does not apply route 3 (three) times daily. Use to check blood sugars 3 times daily. Dx Code E13.9 1 kit 2  . CONTOUR NEXT TEST test strip USE 1 STRIP TO CHECK GLUCOSE 4 TIMES DAILY 300 each 1  . donepezil (ARICEPT) 10 MG tablet Take 1 tablet (10 mg total) by mouth daily. 90 tablet 4  . furosemide (LASIX) 40 MG tablet Take 1 tablet (40 mg total) by mouth in the morning. 30 tablet 6  . insulin degludec (TRESIBA FLEXTOUCH) 200 UNIT/ML FlexTouch Pen Inject 24 Units into the skin at bedtime. (Patient taking differently: Inject 20 Units into the skin at bedtime. 24 units on  Chemo days)    . insulin lispro (HUMALOG) 100 UNIT/ML cartridge Inject 5-15 Units into the skin 3 (three) times daily with meals. Sliding scale.    . Insulin Pen Needle (EASY COMFORT PEN NEEDLES) 33G X 4 MM MISC 1 each by Does not apply route See admin instructions. Use to inject insulin 5 times daily. 150 each 3  . levothyroxine (SYNTHROID, LEVOTHROID) 75 MCG tablet Take 75 mcg by mouth daily before breakfast.   1  . lidocaine (XYLOCAINE) 2 % injection See admin instructions.    . lidocaine (XYLOCAINE) 2 % solution Use as directed 15 mLs in the mouth or throat as needed for mouth pain. 100 mL 0  . lidocaine-prilocaine (EMLA) cream Apply 1 application topically as needed. Apply a teaspoon over port site at least 1 hour prior to lab appt. Do not rub in and cover with plastic wrap. 30 g 0  . lisinopril (ZESTRIL) 10 MG tablet TAKE ONE TABLET DAILY (Patient taking differently: Take 10 mg by mouth daily.) 90 tablet 2  . metFORMIN (GLUCOPHAGE-XR) 750 MG 24 hr tablet TAKE 1 TABLET WITH DINNER 90 tablet 0  . metoprolol succinate (TOPROL-XL) 25 MG 24 hr tablet Take 1 tablet (25 mg total) by mouth daily. 90 tablet 3  . Nutritional Supplements (ENSURE MAX  PROTEIN PO) Take by mouth.    Marland Kitchen ofloxacin (OCUFLOX) 0.3 % ophthalmic solution Place 1 drop into the right eye every 4 (four) hours for 10 days. 5 mL 0  . ondansetron (ZOFRAN) 4 MG tablet Take 1 tablet (4 mg total) by mouth every 6 (six) hours as needed for nausea. 20 tablet 0  . OXYGEN Inhale 2 L into the lungs. At bedtime and during the day prn    . potassium chloride SA (K-DUR,KLOR-CON) 20 MEQ tablet Take 20 mEq by mouth 2 (two) times daily.     . prednisoLONE acetate (PRED FORTE) 1 % ophthalmic suspension Place 1 drop into the right eye 4 (four) times daily. 10 mL 0  . prochlorperazine (COMPAZINE) 10 MG tablet Take 1 tablet (10 mg total) by mouth every 6 (six) hours as needed for nausea or vomiting. 30 tablet 0  . rosuvastatin (CRESTOR) 20 MG tablet Take  20 mg by mouth daily.    . sotorasib (LUMAKRAS) 120 MG TABS Take 960 mg by mouth every morning. 240 tablet 3  . Tamsulosin HCl (FLOMAX) 0.4 MG CAPS Take 0.4 mg by mouth daily.     . Tiotropium Bromide Monohydrate (SPIRIVA RESPIMAT) 2.5 MCG/ACT AERS Inhale 2 puffs into the lungs daily. 4 g 0  . vitamin B-12 (CYANOCOBALAMIN) 1000 MCG tablet Take 1,000 mcg by mouth daily.     No current facility-administered medications for this visit.    LABS/IMAGING: No results found for this or any previous visit (from the past 48 hour(s)). No results found.  VITALS: BP (!) 178/78   Pulse 66   Ht '5\' 7"'  (1.702 m)   Wt 232 lb (105.2 kg)   SpO2 90%   BMI 36.34 kg/m   EXAM: General appearance: alert, no distress and in wheelchair Neck: JVD - 3 cm above sternal notch, no carotid bruit and thyroid not enlarged, symmetric, no tenderness/mass/nodules Lungs: diminished breath sounds bibasilar and rales bibasilar Heart: irregularly irregular rhythm Abdomen: soft, non-tender; bowel sounds normal; no masses,  no organomegaly Extremities: edema 1+ RLE and LLE in a brace Pulses: 2+ and symmetric Skin: Skin color, texture, turgor normal. No rashes or lesions Neurologic: Grossly normal Psych: Pleasant  EKG: Sinus rhythm with Mobitz 1 AV block, pulmonary disease pattern LAFB at 66-personally reviewed  ASSESSMENT: 1. Paroxysmal atrial fibrillation - CHADSVASC score of 6 2. Lung Cancer 3. Recent multifocal pneumonia 4. Coronary artery disease status post BMS to the RCA in 2005, recent CABG 2 (LIMA to LAD - for critical left main disease, SVG to OM1)- 2016 5. Obesity 6. Hypertension-controlled 7. Dyslipidemia 8. Prior stroke 9. Diabetic neuropathy 10. Diabetic nephropathy - left foot drop 11. Type 1 diabetes on insulin 12. LE edema 13. Mild aortic stenosis 14. LVEF 48% with mild to moderate AS (03/2020)  PLAN: 1.   Randall Wilkins has had progressive dyspnea.  This most likely is pulmonary with some  improvement on Spiriva but he also has lung cancer.  He had a recent multifocal pneumonia.  He was thought to have PAF although his EKGs have been borderline with a first-degree AV block which is long and today what looks like a type I Mobitz 2 block.  He is on Eliquis.  There is some lower extremity edema and this may be a degree of heart failure.  His last EF was 48% with some aortic stenosis.  I would like to repeat his echo to see if the LV function is worsened.  In addition we will  get a BNP.  We may need to increase his diuretics further.  Follow-up with me in 3 months.  Pixie Casino, MD, [facility], [place] Director of the [facility] Lipid Disorders &  Cardiovascular Risk Reduction Clinic Diplomate of the American Board of Clinical Lipidology Attending Cardiologist  Direct Dial: [phone]  Fax: [phone]  Website:  [email or web address] 02/06/2021, 9:58 AM

## 2021-02-06 NOTE — Patient Instructions (Signed)
Medication Instructions:  Your physician recommends that you continue on your current medications as directed. Please refer to the Current Medication list given to you today. *If you need a refill on your cardiac medications before your next appointment, please call your pharmacy*   Lab Work: BNP - can be done at [facility] If you have labs (blood work) drawn today and your tests are completely normal, you will receive your results only by: Marland Kitchen MyChart Message (if you have MyChart) OR . A paper copy in the mail If you have any lab test that is abnormal or we need to change your treatment, we will call you to review the results.   Testing/Procedures: Your physician has requested that you have an echocardiogram. Echocardiography is a painless test that uses sound waves to create images of your heart. It provides your doctor with information about the size and shape of your heart and how well your heart's chambers and valves are working. This procedure takes approximately one hour. There are no restrictions for this procedure. -- [address] - [address] [place]  Follow-Up: At [facility], you and your health needs are our priority.  As part of our continuing mission to provide you with exceptional heart care, we have created designated Provider Care Teams.  These Care Teams include your primary Cardiologist (physician) and Advanced Practice Providers (APPs -  Physician Assistants and Nurse Practitioners) who all work together to provide you with the care you need, when you need it.  We recommend signing up for the patient portal called "MyChart".  Sign up information is provided on this After Visit Summary.  MyChart is used to connect with patients for Virtual Visits (Telemedicine).  Patients are able to view lab/test results, encounter notes, upcoming appointments, etc.  Non-urgent messages can be sent to your provider as well.   To learn more about what you can do with MyChart, go to  [email or web address].    Your next appointment:   3 month(s)  The format for your next appointment:   In Person  Provider:   You may see Pixie Casino, MD or one of the following Advanced Practice Providers on your designated Care Team:    Almyra Deforest, PA-C  Fabian Sharp, PA-C or   Roby Lofts, [place]    Other Instructions

## 2021-02-08 DIAGNOSIS — C3491 Malignant neoplasm of unspecified part of right bronchus or lung: Secondary | ICD-10-CM | POA: Diagnosis not present

## 2021-02-10 DIAGNOSIS — I10 Essential (primary) hypertension: Secondary | ICD-10-CM | POA: Diagnosis not present

## 2021-02-10 DIAGNOSIS — E1129 Type 2 diabetes mellitus with other diabetic kidney complication: Secondary | ICD-10-CM | POA: Diagnosis not present

## 2021-02-10 DIAGNOSIS — E1149 Type 2 diabetes mellitus with other diabetic neurological complication: Secondary | ICD-10-CM | POA: Diagnosis not present

## 2021-02-10 DIAGNOSIS — N183 Chronic kidney disease, stage 3 unspecified: Secondary | ICD-10-CM | POA: Diagnosis not present

## 2021-02-12 ENCOUNTER — Encounter (AMBULATORY_SURGERY_CENTER): Payer: Medicare Other | Admitting: Ophthalmology

## 2021-02-12 DIAGNOSIS — H35371 Puckering of macula, right eye: Secondary | ICD-10-CM | POA: Diagnosis not present

## 2021-02-13 ENCOUNTER — Encounter (INDEPENDENT_AMBULATORY_CARE_PROVIDER_SITE_OTHER): Payer: Self-pay | Admitting: Ophthalmology

## 2021-02-13 ENCOUNTER — Other Ambulatory Visit: Payer: Self-pay

## 2021-02-13 ENCOUNTER — Encounter (INDEPENDENT_AMBULATORY_CARE_PROVIDER_SITE_OTHER): Payer: Self-pay

## 2021-02-13 ENCOUNTER — Ambulatory Visit (INDEPENDENT_AMBULATORY_CARE_PROVIDER_SITE_OTHER): Payer: Medicare Other | Admitting: Ophthalmology

## 2021-02-13 DIAGNOSIS — H35371 Puckering of macula, right eye: Secondary | ICD-10-CM

## 2021-02-13 NOTE — Assessment & Plan Note (Signed)
Postop day #1, looks great OD  No lifting and bending for 1 week. No water in the eye for 10 days. Do not rub the eye. Wear shield at night for 1-3 days.  Wear your CPAP as normal, if instructed by your doctor.  Continue your topical medications for a total of 3 weeks.  Do not refill your postoperative medications unless instructed.  Ofloxacin  4 times daily to the operative eye  Prednisolone acetate 1 drop to the operative eye 4 times daily  Patient instructed not to refill the medications and use them for maximum of 3 weeks.  Patient instructed do not rub the eye.  Patient has the option to use the patch at night.

## 2021-02-13 NOTE — Progress Notes (Signed)
02/13/2021     CHIEF COMPLAINT Patient presents for Post-op Follow-up (1 day s/p vitrectomy membrane peel OD for ERM/Pt denies ocular pain/discomfort.)   HISTORY OF PRESENT ILLNESS: Randall Wilkins is a 80 y.o. male who presents to the clinic today for:   HPI    Post-op Follow-up    In right eye.  Discomfort includes none.  Vision is stable.  I, the attending physician,  performed the HPI with the patient and updated documentation appropriately. Additional comments: 1 day s/p vitrectomy membrane peel OD for ERM Pt denies ocular pain/discomfort.       Last edited by Tilda Franco on 02/13/2021  8:48 AM. (History)      Referring physician: [name], MD [address],  [place] [address]  HISTORICAL INFORMATION:   Selected notes from the MEDICAL RECORD NUMBER    Lab Results  Component Value Date   HGBA1C 6.4 01/06/2021     CURRENT MEDICATIONS: Current Outpatient Medications (Ophthalmic Drugs)  Medication Sig  . ofloxacin (OCUFLOX) 0.3 % ophthalmic solution Place 1 drop into the right eye every 4 (four) hours for 10 days.  . prednisoLONE acetate (PRED FORTE) 1 % ophthalmic suspension Place 1 drop into the right eye 4 (four) times daily.   No current facility-administered medications for this visit. (Ophthalmic Drugs)   Current Outpatient Medications (Other)  Medication Sig  . albuterol (VENTOLIN HFA) 108 (90 Base) MCG/ACT inhaler Inhale 2 puffs into the lungs every 6 (six) hours as needed for wheezing or shortness of breath.  . allopurinol (ZYLOPRIM) 100 MG tablet Take 1 tablet (100 mg total) by mouth daily.  Marland Kitchen amitriptyline (ELAVIL) 75 MG tablet Take 75 mg by mouth at bedtime.  Marland Kitchen apixaban (ELIQUIS) 2.5 MG TABS tablet Take 1 tablet (2.5 mg total) by mouth 2 (two) times daily.  . Blood Glucose Monitoring Suppl (CONTOUR NEXT MONITOR) w/Device KIT 1 Device by Does not apply route 3 (three) times daily. Use to check blood sugars 3 times daily. Dx  Code E13.9  . CONTOUR NEXT TEST test strip USE 1 STRIP TO CHECK GLUCOSE 4 TIMES DAILY  . donepezil (ARICEPT) 10 MG tablet Take 1 tablet (10 mg total) by mouth daily.  . furosemide (LASIX) 40 MG tablet Take 1 tablet (40 mg total) by mouth in the morning.  . insulin degludec (TRESIBA FLEXTOUCH) 200 UNIT/ML FlexTouch Pen Inject 24 Units into the skin at bedtime. (Patient taking differently: Inject 20 Units into the skin at bedtime. 24 units on Chemo days)  . insulin lispro (HUMALOG) 100 UNIT/ML cartridge Inject 5-15 Units into the skin 3 (three) times daily with meals. Sliding scale.  . Insulin Pen Needle (EASY COMFORT PEN NEEDLES) 33G X 4 MM MISC 1 each by Does not apply route See admin instructions. Use to inject insulin 5 times daily.  Marland Kitchen levothyroxine (SYNTHROID, LEVOTHROID) 75 MCG tablet Take 75 mcg by mouth daily before breakfast.   . lidocaine (XYLOCAINE) 2 % injection See admin instructions.  . lidocaine (XYLOCAINE) 2 % solution Use as directed 15 mLs in the mouth or throat as needed for mouth pain.  Marland Kitchen lidocaine-prilocaine (EMLA) cream Apply 1 application topically as needed. Apply a teaspoon over port site at least 1 hour prior to lab appt. Do not rub in and cover with plastic wrap.  . lisinopril (ZESTRIL) 10 MG tablet TAKE ONE TABLET DAILY (Patient taking differently: Take 10 mg by mouth daily.)  . metFORMIN (GLUCOPHAGE-XR) 750 MG 24  hr tablet TAKE 1 TABLET WITH DINNER  . metoprolol succinate (TOPROL-XL) 25 MG 24 hr tablet Take 1 tablet (25 mg total) by mouth daily.  . Nutritional Supplements (ENSURE MAX PROTEIN PO) Take by mouth.  . ondansetron (ZOFRAN) 4 MG tablet Take 1 tablet (4 mg total) by mouth every 6 (six) hours as needed for nausea.  . OXYGEN Inhale 2 L into the lungs. At bedtime and during the day prn  . potassium chloride SA (K-DUR,KLOR-CON) 20 MEQ tablet Take 20 mEq by mouth 2 (two) times daily.   . prochlorperazine (COMPAZINE) 10 MG tablet Take 1 tablet (10 mg total) by mouth  every 6 (six) hours as needed for nausea or vomiting.  . rosuvastatin (CRESTOR) 20 MG tablet Take 20 mg by mouth daily.  . sotorasib (LUMAKRAS) 120 MG TABS Take 960 mg by mouth every morning.  . Tamsulosin HCl (FLOMAX) 0.4 MG CAPS Take 0.4 mg by mouth daily.   . Tiotropium Bromide Monohydrate (SPIRIVA RESPIMAT) 2.5 MCG/ACT AERS Inhale 2 puffs into the lungs daily.  . vitamin B-12 (CYANOCOBALAMIN) 1000 MCG tablet Take 1,000 mcg by mouth daily.   No current facility-administered medications for this visit. (Other)      REVIEW OF SYSTEMS:    ALLERGIES Allergies  Allergen Reactions  . Actos [Pioglitazone] Swelling  . Metformin And Related Nausea Only  . Niaspan [Niacin Er] Itching and Rash    PAST MEDICAL HISTORY Past Medical History:  Diagnosis Date  . Aortic stenosis, mild 11/14/2013  . Aortic valve sclerosis 03/29/2015  . Bilateral leg edema 05/21/2014  . CAD (coronary artery disease)   . Chronic diastolic congestive heart failure ([place])   . Chronic kidney disease (CKD), stage III (moderate) (HCC)   . Diabetes 1.5, managed as type 1 ([place]) 02/04/2013  . Diabetic peripheral neuropathy associated with type 1 diabetes mellitus ([place]) 11/14/2013  . Dysphagia   . Dyspnea on exertion 03/21/2015  . Exogenous obesity   . Gout   . Heart attack ([place])   . History of nuclear stress test 07/2011   dipyridamole; fixed inferolateral defect, worse at stress than rest; no reversible ischemia; low risk scan   . Hyperlipidemia   . Hypertension   . Hypothyroidism   . Insulin dependent diabetes mellitus   . Left foot drop   . Left main coronary artery disease 03/28/2015  . lung ca dx'd 08/2020  . Memory loss   . Obesity (BMI 30-39.9) 11/14/2013  . Obstructive sleep apnea 03/21/2015  . OSA on CPAP    uses a cpap  . Peptic ulcer with hemorrhage 03/28/2015  . Peripheral neuropathy   . Pneumonia   . Rhabdomyolysis   . 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 Stroke  ([place])    L patietal with small scattered lacunar infarcts  . Thrombocytopenia ([place]) 03/21/2015  . Venous insufficiency   . Weakness generalized 03/21/2015   Past Surgical History:  Procedure Laterality Date  . BACK SURGERY  2002   lumbosacral. 11 back surgeries total  . BRONCHIAL BIOPSY  08/06/2020   Procedure: BRONCHIAL BIOPSIES;  Surgeon: Collene Gobble, MD;  Location: [facility] ENDOSCOPY;  Service: Pulmonary;;  . BRONCHIAL BRUSHINGS  08/06/2020   Procedure: BRONCHIAL BRUSHINGS;  Surgeon: Collene Gobble, MD;  Location: [facility] ENDOSCOPY;  Service: Pulmonary;;  . BRONCHIAL NEEDLE ASPIRATION BIOPSY  08/06/2020   Procedure: BRONCHIAL NEEDLE ASPIRATION BIOPSIES;  Surgeon: Collene Gobble, MD;  Location:  [facility] ENDOSCOPY;  Service: Pulmonary;;  . Carotid Doppler  03/2013   bilat bulb/prox ICAs - mild amount of fibrous plaque with no evidence of diameter reduction  . CARPAL TUNNEL RELEASE Bilateral 08/09/2014   Procedure: BILATERAL CARPAL TUNNEL RELEASE;  Surgeon: Daryll Brod, MD;  Location: [facility];  Service: Orthopedics;  Laterality: Bilateral;  ANESTHESIA:  IV REGIONAL BIL FAB  . CHOLECYSTECTOMY    . COLONOSCOPY    . CORONARY ANGIOPLASTY  10/13/1996  . CORONARY ANGIOPLASTY  09/21/1989   emergency PTCA  . CORONARY ANGIOPLASTY  10/13/1996   Multi-Link diagonal & OD stenting (Dr. Marella Chimes)  . CORONARY ANGIOPLASTY  12/03/1997   disease of mid DX-1 ~50% & in mid PLA & PDA (distal lesions) (Dr. Marella Chimes)   . CORONARY ANGIOPLASTY  10/14/1999   progression of disease distal PLA & PDA; progression of disease prox RCA - moderate (Dr. Marella Chimes)   . CORONARY ANGIOPLASTY WITH STENT PLACEMENT  04/04/2004   4.0x56m non-DES (thrombectomy via AngioJet) to RCA for high grade stenosis (Dr. RMarella Chimes  . CORONARY ARTERY BYPASS GRAFT N/A 03/29/2015   Procedure: CORONARY ARTERY BYPASS GRAFTING TIMES TWO USING LEFT INTERNAL MAMMARY ARTERY AND RIGHT LEG GREATER SAPHENOUS VEIN HARVESTED  ENDOSCOPICALLY.;  Surgeon: CRexene Alberts MD;  [place] [facility]  Service: Open Heart Surgery;  Laterality: N/A;  . ESOPHAGOGASTRODUODENOSCOPY N/A 03/27/2015   Procedure: ESOPHAGOGASTRODUODENOSCOPY (EGD);  Surgeon: MClarene Essex MD;  [facility];  Service: Endoscopy;  Laterality: N/A;  possible dilation  . ESOPHAGOGASTRODUODENOSCOPY N/A 10/05/2020   Procedure: ESOPHAGOGASTRODUODENOSCOPY (EGD);  Surgeon: OArta Silence MD;  [facility];  Service: Endoscopy;  Laterality: N/A;  . FINE NEEDLE ASPIRATION  08/06/2020   Procedure: FINE NEEDLE ASPIRATION (FNA) LINEAR;  Surgeon: BCollene Gobble MD;  [facility];  Service: Pulmonary;;  . IR IMAGING GUIDED PORT INSERTION  10/07/2020  . LEFT HEART CATHETERIZATION WITH CORONARY ANGIOGRAM N/A 03/28/2015   Procedure: LEFT HEART CATHETERIZATION WITH CORONARY ANGIOGRAM;  Surgeon: Peter M JMartinique MD;  [facility];  Service: Cardiovascular;  Laterality: N/A;  . SINUS ENDO W/FUSION    . TONSILLECTOMY    . TRANSTHORACIC ECHOCARDIOGRAM  08/08/2013   EF 55-60%, mild conc hypertrophy, grade 1 diastolic dysfunction; AV with mild stenosis; LA & RA mildly dilated  . VIDEO BRONCHOSCOPY WITH ENDOBRONCHIAL NAVIGATION N/A 08/06/2020   Procedure: VIDEO BRONCHOSCOPY WITH ENDOBRONCHIAL NAVIGATION;  Surgeon: BCollene Gobble MD;  [facility];  Service: Pulmonary;  Laterality: N/A;  . VIDEO BRONCHOSCOPY WITH ENDOBRONCHIAL ULTRASOUND N/A 08/06/2020   Procedure: VIDEO BRONCHOSCOPY WITH ENDOBRONCHIAL ULTRASOUND;  Surgeon: BCollene Gobble MD;  [facility];  Service: Pulmonary;  Laterality: N/A;    FAMILY HISTORY Family History  Problem Relation Age of Onset  . Heart disease Mother   . Coronary artery disease Father   . Cancer Maternal Grandmother   . Heart Problems Maternal Grandfather   . Diabetes Son        borderline     SOCIAL HISTORY Social History   Tobacco Use  . Smoking status: Former Smoker     Packs/day: 2.00    Years: 20.00    Pack years: 40.00    Types: Cigarettes    Quit date: 01/25/1974    Years since quitting: 47.0  . Smokeless tobacco: Never Used  Vaping Use  . Vaping Use: Never used  Substance Use Topics  . Alcohol use: No    Alcohol/week: 0.0 standard drinks  .  Drug use: No         OPHTHALMIC EXAM: Base Eye Exam    Visual Acuity (Snellen - Linear)      Right Left   Dist [place] 20/100 +1 20/50 +1   Dist ph [place] 20/70 -1 20/40 -2       Tonometry (Tonopen, 8:55 AM)      Right Left   Pressure 12 8       Neuro/Psych    Oriented x3: Yes   Mood/Affect: Normal        Slit Lamp and Fundus Exam    External Exam      Right Left   External Normal Normal       Slit Lamp Exam      Right Left   Lids/Lashes Normal Normal   Conjunctiva/Sclera White and quiet White and quiet   Cornea Clear Clear   Anterior Chamber Deep and quiet Deep and quiet   Iris Round and reactive Round and reactive   Lens Centered posterior chamber intraocular lens Centered posterior chamber intraocular lens   Anterior Vitreous Normal Normal       Fundus Exam      Right Left   Posterior Vitreous Clear, avitric    Disc Normal    C/D Ratio 0.5    Macula No topographic distortion    Vessels Normal, no DR    Periphery Normal           IMAGING AND PROCEDURES  Imaging and Procedures for 02/13/21           ASSESSMENT/PLAN:  Macular pucker, right eye Postop day #1, looks great OD  No lifting and bending for 1 week. No water in the eye for 10 days. Do not rub the eye. Wear shield at night for 1-3 days.  Wear your CPAP as normal, if instructed by your doctor.  Continue your topical medications for a total of 3 weeks.  Do not refill your postoperative medications unless instructed.  Ofloxacin  4 times daily to the operative eye  Prednisolone acetate 1 drop to the operative eye 4 times daily  Patient instructed not to refill the medications and use them for maximum of 3  weeks.  Patient instructed do not rub the eye.  Patient has the option to use the patch at night.      ICD-10-CM   1. Macular pucker, right eye  H35.371     1.  Postop day #1 status post vitrectomy membrane peel for macular pucker  Patient findings today look great, clear media.  2.  Patient instructed to continue and to resume topical drop therapy in the right eye as outlined above  3.  Patient instructed not to mash or compress or rub the eye  Ophthalmic Meds Ordered this visit:  No orders of the defined types were placed in this encounter.      Return in about 1 week (around 02/20/2021) for POST OP, OCT, OD.  There are no Patient Instructions on file for this visit.   Explained the diagnoses, plan, and follow up with the patient and they expressed understanding.  Patient expressed understanding of the importance of proper follow up care.   Clent Demark Lexianna Weinrich M.D. Diseases & Surgery of the Retina and Vitreous [facility] 02/13/21     Abbreviations: M myopia (nearsighted); A astigmatism; H hyperopia (farsighted); P presbyopia; Mrx spectacle prescription;  CTL contact lenses; OD right eye; OS left eye; OU both eyes  XT exotropia;  ET esotropia; PEK punctate epithelial keratitis; PEE punctate epithelial erosions; DES dry eye syndrome; MGD meibomian gland dysfunction; ATs artificial tears; PFAT's preservative free artificial tears; [place] nuclear sclerotic cataract; PSC posterior subcapsular cataract; ERM epi-retinal membrane; PVD posterior vitreous detachment; RD retinal detachment; DM diabetes mellitus; DR diabetic retinopathy; NPDR non-proliferative diabetic retinopathy; PDR proliferative diabetic retinopathy; CSME clinically significant macular edema; DME diabetic macular edema; dbh dot blot hemorrhages; CWS cotton wool spot; POAG primary open angle glaucoma; C/D cup-to-disc ratio; HVF humphrey visual field; GVF goldmann visual field; OCT optical coherence tomography;  IOP intraocular pressure; BRVO Branch retinal vein occlusion; CRVO central retinal vein occlusion; CRAO central retinal artery occlusion; BRAO branch retinal artery occlusion; RT retinal tear; SB scleral buckle; PPV pars plana vitrectomy; VH Vitreous hemorrhage; PRP panretinal laser photocoagulation; IVK intravitreal kenalog; VMT vitreomacular traction; MH Macular hole;  NVD neovascularization of the disc; NVE neovascularization elsewhere; AREDS age related eye disease study; ARMD age related macular degeneration; POAG primary open angle glaucoma; EBMD epithelial/anterior basement membrane dystrophy; ACIOL anterior chamber intraocular lens; IOL intraocular lens; PCIOL posterior chamber intraocular lens; Phaco/IOL phacoemulsification with intraocular lens placement; [place] photorefractive keratectomy; LASIK laser assisted in situ keratomileusis; HTN hypertension; DM diabetes mellitus; COPD chronic obstructive pulmonary disease

## 2021-02-17 ENCOUNTER — Encounter: Payer: Self-pay | Admitting: Endocrinology

## 2021-02-18 ENCOUNTER — Inpatient Hospital Stay: Payer: Medicare Other | Attending: Internal Medicine

## 2021-02-18 ENCOUNTER — Inpatient Hospital Stay: Payer: Medicare Other | Admitting: Internal Medicine

## 2021-02-18 ENCOUNTER — Telehealth: Payer: Self-pay | Admitting: Internal Medicine

## 2021-02-18 ENCOUNTER — Other Ambulatory Visit: Payer: Self-pay

## 2021-02-18 VITALS — BP 152/102 | HR 68 | Temp 97.8°F | Resp 16 | Ht 67.0 in | Wt 226.5 lb

## 2021-02-18 DIAGNOSIS — N183 Chronic kidney disease, stage 3 unspecified: Secondary | ICD-10-CM | POA: Insufficient documentation

## 2021-02-18 DIAGNOSIS — R0602 Shortness of breath: Secondary | ICD-10-CM | POA: Diagnosis not present

## 2021-02-18 DIAGNOSIS — C3412 Malignant neoplasm of upper lobe, left bronchus or lung: Secondary | ICD-10-CM | POA: Insufficient documentation

## 2021-02-18 DIAGNOSIS — R0600 Dyspnea, unspecified: Secondary | ICD-10-CM | POA: Insufficient documentation

## 2021-02-18 DIAGNOSIS — Z5111 Encounter for antineoplastic chemotherapy: Secondary | ICD-10-CM | POA: Diagnosis not present

## 2021-02-18 DIAGNOSIS — E1122 Type 2 diabetes mellitus with diabetic chronic kidney disease: Secondary | ICD-10-CM | POA: Diagnosis not present

## 2021-02-18 DIAGNOSIS — C3491 Malignant neoplasm of unspecified part of right bronchus or lung: Secondary | ICD-10-CM | POA: Diagnosis not present

## 2021-02-18 DIAGNOSIS — I130 Hypertensive heart and chronic kidney disease with heart failure and stage 1 through stage 4 chronic kidney disease, or unspecified chronic kidney disease: Secondary | ICD-10-CM | POA: Insufficient documentation

## 2021-02-18 DIAGNOSIS — C3490 Malignant neoplasm of unspecified part of unspecified bronchus or lung: Secondary | ICD-10-CM | POA: Diagnosis not present

## 2021-02-18 DIAGNOSIS — C3411 Malignant neoplasm of upper lobe, right bronchus or lung: Secondary | ICD-10-CM | POA: Diagnosis not present

## 2021-02-18 LAB — CBC WITH DIFFERENTIAL (CANCER CENTER ONLY)
Abs Immature Granulocytes: 0.01 10*3/uL (ref 0.00–0.07)
Basophils Absolute: 0.1 10*3/uL (ref 0.0–0.1)
Basophils Relative: 1 %
Eosinophils Absolute: 0.1 10*3/uL (ref 0.0–0.5)
Eosinophils Relative: 1 %
HCT: 38.6 % — ABNORMAL LOW (ref 39.0–52.0)
Hemoglobin: 11.8 g/dL — ABNORMAL LOW (ref 13.0–17.0)
Immature Granulocytes: 0 %
Lymphocytes Relative: 17 %
Lymphs Abs: 0.9 10*3/uL (ref 0.7–4.0)
MCH: 28.4 pg (ref 26.0–34.0)
MCHC: 30.6 g/dL (ref 30.0–36.0)
MCV: 93 fL (ref 80.0–100.0)
Monocytes Absolute: 0.7 10*3/uL (ref 0.1–1.0)
Monocytes Relative: 12 %
Neutro Abs: 3.7 10*3/uL (ref 1.7–7.7)
Neutrophils Relative %: 69 %
Platelet Count: 123 10*3/uL — ABNORMAL LOW (ref 150–400)
RBC: 4.15 MIL/uL — ABNORMAL LOW (ref 4.22–5.81)
RDW: 15.1 % (ref 11.5–15.5)
WBC Count: 5.4 10*3/uL (ref 4.0–10.5)
nRBC: 0 % (ref 0.0–0.2)

## 2021-02-18 LAB — CMP (CANCER CENTER ONLY)
ALT: 10 U/L (ref 0–44)
AST: 16 U/L (ref 15–41)
Albumin: 3.3 g/dL — ABNORMAL LOW (ref 3.5–5.0)
Alkaline Phosphatase: 102 U/L (ref 38–126)
Anion gap: 11 (ref 5–15)
BUN: 21 mg/dL (ref 8–23)
CO2: 28 mmol/L (ref 22–32)
Calcium: 9.3 mg/dL (ref 8.9–10.3)
Chloride: 104 mmol/L (ref 98–111)
Creatinine: 1.44 mg/dL — ABNORMAL HIGH (ref 0.61–1.24)
GFR, Estimated: 49 mL/min — ABNORMAL LOW (ref >60)
Glucose, Bld: 170 mg/dL — ABNORMAL HIGH (ref 70–99)
Potassium: 4.4 mmol/L (ref 3.5–5.1)
Sodium: 143 mmol/L (ref 135–145)
Total Bilirubin: 0.5 mg/dL (ref 0.3–1.2)
Total Protein: 6.8 g/dL (ref 6.5–8.1)

## 2021-02-18 NOTE — Telephone Encounter (Signed)
Scheduled appointments per 3/8 los. Spoke to patient who is aware of appointments dates and times. Gave patient calendar print out.

## 2021-02-18 NOTE — Progress Notes (Signed)
[place] [phone]   [phone]  OFFICE PROGRESS NOTE  [name], MD [address] [place] [address]  DIAGNOSIS: Stage IV (T4, N2, M1 a) non-small cell lung cancer, mucinous adenocarcinoma presented with bilateral large masses in the left upper lobe as well as the right lower lobe in addition to other small nodules in the right upper lobe with hilar and mediastinal lymphadenopathy diagnosed in August 2021.  Biomarker Findings Microsatellite status - MS-Stable Tumor Mutational Burden - 4 Muts/Mb Genomic Findings For a complete list of the genes assayed, please refer to the Appendix. KRAS G12C GNAS R201H 7 Disease relevant genes with no reportable alterations: ALK, BRAF, EGFR, ERBB2, MET, RET, ROS1  PDL1 Expression: 20%.  PRIOR THERAPY: Systemic chemotherapy with carboplatin for AUC of 5, Alimta 500 mg/M2 and Keytruda 200 mg IV every 3 weeks.  First dose 09/09/2020.  Status post 6 cycles.  Starting from cycle #5 he has been on maintenance treatment with Alimta and Keytruda.  CURRENT THERAPY: Lumakras (Sotorasib) 960 mg p.o. daily.  First dose started 01/25/2021.  INTERVAL HISTORY: Randall Wilkins 80 y.o. male returns to the clinic today for follow-up visit accompanied by his wife.  The patient is feeling fine today with no concerning complaints except for few episodes of diarrhea but this is improved after his wife caught on the dairy product and his meals.  He had an right eye surgery recently for macular issues and he has redness of the conjunctiva after the surgery.  He denied having any chest pain but has shortness of breath with exertion with mild cough and no hemoptysis.  He denied having any fever or chills.  He has no current nausea, vomiting or constipation.  The patient continues to tolerate his treatment with Lumakras (Sotorasib) fairly well.  He is scheduled to have 2D echo for evaluation of his heart by his  cardiologist in the next few weeks.  The patient is here today for evaluation and repeat blood work.   MEDICAL HISTORY: Past Medical History:  Diagnosis Date  . Aortic stenosis, mild 11/14/2013  . Aortic valve sclerosis 03/29/2015  . Bilateral leg edema 05/21/2014  . CAD (coronary artery disease)   . Chronic diastolic congestive heart failure ([place])   . Chronic kidney disease (CKD), stage III (moderate) (HCC)   . Diabetes 1.5, managed as type 1 ([place]) 02/04/2013  . Diabetic peripheral neuropathy associated with type 1 diabetes mellitus ([place]) 11/14/2013  . Dysphagia   . Dyspnea on exertion 03/21/2015  . Exogenous obesity   . Gout   . Heart attack ([place])   . History of nuclear stress test 07/2011   dipyridamole; fixed inferolateral defect, worse at stress than rest; no reversible ischemia; low risk scan   . Hyperlipidemia   . Hypertension   . Hypothyroidism   . Insulin dependent diabetes mellitus   . Left foot drop   . Left main coronary artery disease 03/28/2015  . lung ca dx'd 08/2020  . Memory loss   . Obesity (BMI 30-39.9) 11/14/2013  . Obstructive sleep apnea 03/21/2015  . OSA on CPAP    uses a cpap  . Peptic ulcer with hemorrhage 03/28/2015  . Peripheral neuropathy   . Pneumonia   . Rhabdomyolysis   . 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 Stroke ([place])    L patietal with small scattered lacunar infarcts  .  Thrombocytopenia (HCC) 03/21/2015  . Venous insufficiency   . Weakness generalized 03/21/2015    ALLERGIES:  is allergic to actos [pioglitazone], metformin and related, and niaspan [niacin er].  MEDICATIONS:  Current Outpatient Medications  Medication Sig Dispense Refill  . albuterol (VENTOLIN HFA) 108 (90 Base) MCG/ACT inhaler Inhale 2 puffs into the lungs every 6 (six) hours as needed for wheezing or shortness of breath. 8 g 6  . allopurinol (ZYLOPRIM) 100 MG tablet Take 1 tablet (100 mg total) by mouth daily. 30 tablet 6  . amitriptyline  (ELAVIL) 75 MG tablet Take 75 mg by mouth at bedtime.    Marland Kitchen apixaban (ELIQUIS) 2.5 MG TABS tablet Take 1 tablet (2.5 mg total) by mouth 2 (two) times daily. 180 tablet 3  . Blood Glucose Monitoring Suppl (CONTOUR NEXT MONITOR) w/Device KIT 1 Device by Does not apply route 3 (three) times daily. Use to check blood sugars 3 times daily. Dx Code E13.9 1 kit 2  . CONTOUR NEXT TEST test strip USE 1 STRIP TO CHECK GLUCOSE 4 TIMES DAILY 300 each 1  . donepezil (ARICEPT) 10 MG tablet Take 1 tablet (10 mg total) by mouth daily. 90 tablet 4  . furosemide (LASIX) 40 MG tablet Take 1 tablet (40 mg total) by mouth in the morning. 30 tablet 6  . insulin degludec (TRESIBA FLEXTOUCH) 200 UNIT/ML FlexTouch Pen Inject 24 Units into the skin at bedtime. (Patient taking differently: Inject 20 Units into the skin at bedtime. 24 units on Chemo days)    . insulin lispro (HUMALOG) 100 UNIT/ML cartridge Inject 5-15 Units into the skin 3 (three) times daily with meals. Sliding scale.    . Insulin Pen Needle (EASY COMFORT PEN NEEDLES) 33G X 4 MM MISC 1 each by Does not apply route See admin instructions. Use to inject insulin 5 times daily. 150 each 3  . levothyroxine (SYNTHROID, LEVOTHROID) 75 MCG tablet Take 75 mcg by mouth daily before breakfast.   1  . lidocaine (XYLOCAINE) 2 % injection See admin instructions.    . lidocaine (XYLOCAINE) 2 % solution Use as directed 15 mLs in the mouth or throat as needed for mouth pain. 100 mL 0  . lidocaine-prilocaine (EMLA) cream Apply 1 application topically as needed. Apply a teaspoon over port site at least 1 hour prior to lab appt. Do not rub in and cover with plastic wrap. 30 g 0  . lisinopril (ZESTRIL) 10 MG tablet TAKE ONE TABLET DAILY (Patient taking differently: Take 10 mg by mouth daily.) 90 tablet 2  . metFORMIN (GLUCOPHAGE-XR) 750 MG 24 hr tablet TAKE 1 TABLET WITH DINNER 90 tablet 0  . metoprolol succinate (TOPROL-XL) 25 MG 24 hr tablet Take 1 tablet (25 mg total) by mouth  daily. 90 tablet 3  . Nutritional Supplements (ENSURE MAX PROTEIN PO) Take by mouth.    . ondansetron (ZOFRAN) 4 MG tablet Take 1 tablet (4 mg total) by mouth every 6 (six) hours as needed for nausea. 20 tablet 0  . OXYGEN Inhale 2 L into the lungs. At bedtime and during the day prn    . potassium chloride SA (K-DUR,KLOR-CON) 20 MEQ tablet Take 20 mEq by mouth 2 (two) times daily.     . prednisoLONE acetate (PRED FORTE) 1 % ophthalmic suspension Place 1 drop into the right eye 4 (four) times daily. 10 mL 0  . prochlorperazine (COMPAZINE) 10 MG tablet Take 1 tablet (10 mg total) by mouth every 6 (six) hours as needed for  nausea or vomiting. 30 tablet 0  . rosuvastatin (CRESTOR) 20 MG tablet Take 20 mg by mouth daily.    . sotorasib (LUMAKRAS) 120 MG TABS Take 960 mg by mouth every morning. 240 tablet 3  . Tamsulosin HCl (FLOMAX) 0.4 MG CAPS Take 0.4 mg by mouth daily.     . Tiotropium Bromide Monohydrate (SPIRIVA RESPIMAT) 2.5 MCG/ACT AERS Inhale 2 puffs into the lungs daily. 4 g 3  . vitamin B-12 (CYANOCOBALAMIN) 1000 MCG tablet Take 1,000 mcg by mouth daily.     No current facility-administered medications for this visit.    SURGICAL HISTORY:  Past Surgical History:  Procedure Laterality Date  . BACK SURGERY  2002   lumbosacral. 11 back surgeries total  . BRONCHIAL BIOPSY  08/06/2020   Procedure: BRONCHIAL BIOPSIES;  Surgeon: Collene Gobble, MD;  Location: [facility] ENDOSCOPY;  Service: Pulmonary;;  . BRONCHIAL BRUSHINGS  08/06/2020   Procedure: BRONCHIAL BRUSHINGS;  Surgeon: Collene Gobble, MD;  Location: [facility] ENDOSCOPY;  Service: Pulmonary;;  . BRONCHIAL NEEDLE ASPIRATION BIOPSY  08/06/2020   Procedure: BRONCHIAL NEEDLE ASPIRATION BIOPSIES;  Surgeon: Collene Gobble, MD;  Location: [facility] ENDOSCOPY;  Service: Pulmonary;;  . Carotid Doppler  03/2013   bilat bulb/prox ICAs - mild amount of fibrous plaque with no evidence of diameter reduction  . CARPAL TUNNEL RELEASE Bilateral 08/09/2014   Procedure:  BILATERAL CARPAL TUNNEL RELEASE;  Surgeon: Daryll Brod, MD;  Location: [place];  Service: Orthopedics;  Laterality: Bilateral;  ANESTHESIA:  IV REGIONAL BIL FAB  . CHOLECYSTECTOMY    . COLONOSCOPY    . CORONARY ANGIOPLASTY  10/13/1996  . CORONARY ANGIOPLASTY  09/21/1989   emergency PTCA  . CORONARY ANGIOPLASTY  10/13/1996   Multi-Link diagonal & OD stenting (Dr. Marella Chimes)  . CORONARY ANGIOPLASTY  12/03/1997   disease of mid DX-1 ~50% & in mid PLA & PDA (distal lesions) (Dr. Marella Chimes)   . CORONARY ANGIOPLASTY  10/14/1999   progression of disease distal PLA & PDA; progression of disease prox RCA - moderate (Dr. Marella Chimes)   . CORONARY ANGIOPLASTY WITH STENT PLACEMENT  04/04/2004   4.0x54m non-DES (thrombectomy via AngioJet) to RCA for high grade stenosis (Dr. RMarella Chimes  . CORONARY ARTERY BYPASS GRAFT N/A 03/29/2015   Procedure: CORONARY ARTERY BYPASS GRAFTING TIMES TWO USING LEFT INTERNAL MAMMARY ARTERY AND RIGHT LEG GREATER SAPHENOUS VEIN HARVESTED ENDOSCOPICALLY.;  Surgeon: CRexene Alberts MD;  [place]  Service: Open Heart Surgery;  Laterality: N/A;  . ESOPHAGOGASTRODUODENOSCOPY N/A 03/27/2015   Procedure: ESOPHAGOGASTRODUODENOSCOPY (EGD);  Surgeon: MClarene Essex MD;  [facility];  Service: Endoscopy;  Laterality: N/A;  possible dilation  . ESOPHAGOGASTRODUODENOSCOPY N/A 10/05/2020   Procedure: ESOPHAGOGASTRODUODENOSCOPY (EGD);  Surgeon: OArta Silence MD;  [facility];  Service: Endoscopy;  Laterality: N/A;  . FINE NEEDLE ASPIRATION  08/06/2020   Procedure: FINE NEEDLE ASPIRATION (FNA) LINEAR;  Surgeon: BCollene Gobble MD;  [facility];  Service: Pulmonary;;  . IR IMAGING GUIDED PORT INSERTION  10/07/2020  . LEFT HEART CATHETERIZATION WITH CORONARY ANGIOGRAM N/A 03/28/2015   Procedure: LEFT HEART CATHETERIZATION WITH CORONARY ANGIOGRAM;  Surgeon: Peter M JMartinique MD;  [facility];  Service: Cardiovascular;   Laterality: N/A;  . SINUS ENDO W/FUSION    . TONSILLECTOMY    . TRANSTHORACIC ECHOCARDIOGRAM  08/08/2013   EF 55-60%, mild conc hypertrophy, grade 1 diastolic dysfunction; AV with mild stenosis; LA & RA mildly dilated  . VIDEO BRONCHOSCOPY WITH  ENDOBRONCHIAL NAVIGATION N/A 08/06/2020   Procedure: VIDEO BRONCHOSCOPY WITH ENDOBRONCHIAL NAVIGATION;  Surgeon: Collene Gobble, MD;  Location: [facility] ENDOSCOPY;  Service: Pulmonary;  Laterality: N/A;  . VIDEO BRONCHOSCOPY WITH ENDOBRONCHIAL ULTRASOUND N/A 08/06/2020   Procedure: VIDEO BRONCHOSCOPY WITH ENDOBRONCHIAL ULTRASOUND;  Surgeon: Collene Gobble, MD;  Location: [facility] ENDOSCOPY;  Service: Pulmonary;  Laterality: N/A;    REVIEW OF SYSTEMS:  A comprehensive review of systems was negative except for: Constitutional: positive for fatigue Respiratory: positive for dyspnea on exertion   PHYSICAL EXAMINATION: General appearance: alert, cooperative, fatigued and no distress Head: Normocephalic, without obvious abnormality, atraumatic Neck: no adenopathy, no JVD, supple, symmetrical, trachea midline and thyroid not enlarged, symmetric, no tenderness/mass/nodules Lymph nodes: Cervical, supraclavicular, and axillary nodes normal. Resp: clear to auscultation bilaterally Back: symmetric, no curvature. ROM normal. No CVA tenderness. Cardio: regular rate and rhythm, S1, S2 normal, no murmur, click, rub or gallop GI: soft, non-tender; bowel sounds normal; no masses,  no organomegaly Extremities: extremities normal, atraumatic, no cyanosis or edema  ECOG PERFORMANCE STATUS: 1 - Symptomatic but completely ambulatory  Blood pressure (!) 152/102, pulse 68, temperature 97.8 F (36.6 C), temperature source Tympanic, resp. rate 16, height _0  (1.702 m), weight 226 lb 8 oz (102.7 kg), SpO2 92 %.  LABORATORY DATA: Lab Results  Component Value Date   WBC 5.4 02/18/2021   HGB 11.8 (L) 02/18/2021   HCT 38.6 (L) 02/18/2021   MCV 93.0 02/18/2021   PLT 123 (L)  02/18/2021      Chemistry      Component Value Date/Time   NA 143 02/18/2021 0950   K 4.4 02/18/2021 0950   CL 104 02/18/2021 0950   CO2 28 02/18/2021 0950   BUN 21 02/18/2021 0950   CREATININE 1.44 (H) 02/18/2021 0950   CREATININE 1.30 (H) 04/30/2020 1044      Component Value Date/Time   CALCIUM 9.3 02/18/2021 0950   ALKPHOS 102 02/18/2021 0950   AST 16 02/18/2021 0950   ALT 10 02/18/2021 0950   BILITOT 0.5 02/18/2021 0950       RADIOGRAPHIC STUDIES: OCT, Retina - OU - Both Eyes  Result Date: 01/29/2021 Right Eye Quality was good. Scan locations included subfoveal. Central Foveal Thickness: 297. Progression has no prior data. Findings include epiretinal membrane. Left Eye Quality was good. Scan locations included subfoveal. Central Foveal Thickness: 314. Progression has no prior data. Findings include epiretinal membrane. Notes Severe retinal thickening of the temporal aspect of the fovea and into the FAZ OD OS with minor epiretinal membrane in the temporal aspect of the macula, no foveal distortion   ASSESSMENT AND PLAN: This is a very pleasant 80 years old white male diagnosed with a stage IV (T4, N2, M1 a) non-small cell lung cancer, mucinous adenocarcinoma presented with bilateral large masses in the left upper lobe as well as the right upper lobe in addition to other pulmonary nodules in the right upper lobe with hilar and mediastinal lymphadenopathy diagnosed in August 2021. Molecular study showed positive KRAS G12C mutation and PD-L1 expression of 20%. The patient is currently undergoing systemic chemotherapy with carboplatin for AUC of 5, Alimta 500 mg/M2 and Keytruda 200 mg IV every 3 weeks status post 6 cycles. Starting from cycle #5 the patient is on maintenance treatment with Alimta and Keytruda every 3 weeks.  His treatment was discontinued secondary to disease progression.  The patient is currently on oral treatment with Lumakras (Sotorasib) 960 mg p.o. daily started on  01/25/2021.  He has been tolerating this treatment fairly well with no concerning adverse effects. I recommended for him to continue his current treatment with Lumakras (Sotorasib) with the same dose. I will see him back for follow-up visit in 1 months for evaluation with repeat CT scan of the chest, abdomen pelvis for restaging of his disease. For the hypertension, it was advised to take his blood pressure medication as prescribed and to discuss with Dr. Duwayne Heck and his primary care physician for adjustment of his medication. The patient was advised to call immediately if he has any other concerning symptoms in the interval. The patient voices understanding of current disease status and treatment options and is in agreement with the current care plan.  All questions were answered. The patient knows to call the clinic with any problems, questions or concerns. We can certainly see the patient much sooner if necessary.  Disclaimer: This note was dictated with voice recognition software. Similar sounding words can inadvertently be transcribed and may not be corrected upon review.

## 2021-02-19 ENCOUNTER — Ambulatory Visit (INDEPENDENT_AMBULATORY_CARE_PROVIDER_SITE_OTHER): Payer: Medicare Other | Admitting: Ophthalmology

## 2021-02-19 ENCOUNTER — Encounter (INDEPENDENT_AMBULATORY_CARE_PROVIDER_SITE_OTHER): Payer: Self-pay | Admitting: Ophthalmology

## 2021-02-19 DIAGNOSIS — H35371 Puckering of macula, right eye: Secondary | ICD-10-CM

## 2021-02-19 LAB — BRAIN NATRIURETIC PEPTIDE: BNP: 877.5 pg/mL — ABNORMAL HIGH (ref 0.0–100.0)

## 2021-02-19 NOTE — Assessment & Plan Note (Signed)
1 week post vitrectomy membrane peel, macular improving, acuity improving,  Topical medications to be completed over the next 2 weeks and not refilled should they be continued before that time.

## 2021-02-19 NOTE — Progress Notes (Signed)
02/19/2021     CHIEF COMPLAINT Patient presents for Post-op Follow-up (1 Wk POV OD//Pt sts [facility] OD has improved significantly. Pt c/o redness OD. Pt denies ocular pain.)   HISTORY OF PRESENT ILLNESS: Randall Wilkins is a 80 y.o. male who presents to the clinic today for:   HPI    Post-op Follow-up    In right eye.  Vision is improved. Additional comments: 1 Wk POV OD  Pt sts [facility] OD has improved significantly. Pt c/o redness OD. Pt denies ocular pain.       Last edited by Rockie Neighbours, [place] on 02/19/2021  9:30 AM. (History)      Referring physician: [name], MD [address],  [place] [address]  HISTORICAL INFORMATION:   Selected notes from the MEDICAL RECORD NUMBER    Lab Results  Component Value Date   HGBA1C 6.4 01/06/2021     CURRENT MEDICATIONS: Current Outpatient Medications (Ophthalmic Drugs)  Medication Sig  . prednisoLONE acetate (PRED FORTE) 1 % ophthalmic suspension Place 1 drop into the right eye 4 (four) times daily.   No current facility-administered medications for this visit. (Ophthalmic Drugs)   Current Outpatient Medications (Other)  Medication Sig  . albuterol (VENTOLIN HFA) 108 (90 Base) MCG/ACT inhaler Inhale 2 puffs into the lungs every 6 (six) hours as needed for wheezing or shortness of breath.  . allopurinol (ZYLOPRIM) 100 MG tablet Take 1 tablet (100 mg total) by mouth daily.  Marland Kitchen amitriptyline (ELAVIL) 75 MG tablet Take 75 mg by mouth at bedtime.  Marland Kitchen apixaban (ELIQUIS) 2.5 MG TABS tablet Take 1 tablet (2.5 mg total) by mouth 2 (two) times daily.  . Blood Glucose Monitoring Suppl (CONTOUR NEXT MONITOR) w/Device KIT 1 Device by Does not apply route 3 (three) times daily. Use to check blood sugars 3 times daily. Dx Code E13.9  . CONTOUR NEXT TEST test strip USE 1 STRIP TO CHECK GLUCOSE 4 TIMES DAILY  . donepezil (ARICEPT) 10 MG tablet Take 1 tablet (10 mg total) by mouth daily.  . furosemide (LASIX) 40 MG tablet Take 1  tablet (40 mg total) by mouth in the morning.  . insulin degludec (TRESIBA FLEXTOUCH) 200 UNIT/ML FlexTouch Pen Inject 24 Units into the skin at bedtime. (Patient taking differently: Inject 20 Units into the skin at bedtime. 24 units on Chemo days)  . insulin lispro (HUMALOG) 100 UNIT/ML cartridge Inject 5-15 Units into the skin 3 (three) times daily with meals. Sliding scale.  . Insulin Pen Needle (EASY COMFORT PEN NEEDLES) 33G X 4 MM MISC 1 each by Does not apply route See admin instructions. Use to inject insulin 5 times daily.  Marland Kitchen levothyroxine (SYNTHROID, LEVOTHROID) 75 MCG tablet Take 75 mcg by mouth daily before breakfast.   . lidocaine (XYLOCAINE) 2 % injection See admin instructions.  . lidocaine (XYLOCAINE) 2 % solution Use as directed 15 mLs in the mouth or throat as needed for mouth pain.  Marland Kitchen lidocaine-prilocaine (EMLA) cream Apply 1 application topically as needed. Apply a teaspoon over port site at least 1 hour prior to lab appt. Do not rub in and cover with plastic wrap.  . lisinopril (ZESTRIL) 10 MG tablet TAKE ONE TABLET DAILY (Patient taking differently: Take 10 mg by mouth daily.)  . metFORMIN (GLUCOPHAGE-XR) 750 MG 24 hr tablet TAKE 1 TABLET WITH DINNER  . metoprolol succinate (TOPROL-XL) 25 MG 24 hr tablet Take 1 tablet (25 mg total) by mouth daily.  . Nutritional  Supplements (ENSURE MAX PROTEIN PO) Take by mouth.  . ondansetron (ZOFRAN) 4 MG tablet Take 1 tablet (4 mg total) by mouth every 6 (six) hours as needed for nausea.  . OXYGEN Inhale 2 L into the lungs. At bedtime and during the day prn  . potassium chloride SA (K-DUR,KLOR-CON) 20 MEQ tablet Take 20 mEq by mouth 2 (two) times daily.   . prochlorperazine (COMPAZINE) 10 MG tablet Take 1 tablet (10 mg total) by mouth every 6 (six) hours as needed for nausea or vomiting.  . rosuvastatin (CRESTOR) 20 MG tablet Take 20 mg by mouth daily.  . sotorasib (LUMAKRAS) 120 MG TABS Take 960 mg by mouth every morning.  . Tamsulosin HCl  (FLOMAX) 0.4 MG CAPS Take 0.4 mg by mouth daily.   . Tiotropium Bromide Monohydrate (SPIRIVA RESPIMAT) 2.5 MCG/ACT AERS Inhale 2 puffs into the lungs daily.  . vitamin B-12 (CYANOCOBALAMIN) 1000 MCG tablet Take 1,000 mcg by mouth daily.   No current facility-administered medications for this visit. (Other)      REVIEW OF SYSTEMS:    ALLERGIES Allergies  Allergen Reactions  . Actos [Pioglitazone] Swelling  . Metformin And Related Nausea Only  . Niaspan [Niacin Er] Itching and Rash    PAST MEDICAL HISTORY Past Medical History:  Diagnosis Date  . Aortic stenosis, mild 11/14/2013  . Aortic valve sclerosis 03/29/2015  . Bilateral leg edema 05/21/2014  . CAD (coronary artery disease)   . Chronic diastolic congestive heart failure (HCC)   . Chronic kidney disease (CKD), stage III (moderate) (HCC)   . 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 Diabetic peripheral neuropathy associated with type 1 diabetes mellitus (HCC) 11/14/2013  . Dysphagia   . Dyspnea on exertion 03/21/2015  . Exogenous obesity   . Gout   . Heart attack (HCC)   . History of nuclear stress test 07/2011   dipyridamole; fixed inferolateral defect, worse at stress than rest; no reversible ischemia; low risk scan   . Hyperlipidemia   . Hypertension   . Hypothyroidism   . Insulin dependent diabetes mellitus   . Left foot drop   . Left main coronary artery disease 03/28/2015  . lung ca dx'd 08/2020  . Memory loss   . Obesity (BMI 30-39.9) 11/14/2013  . Obstructive sleep apnea 03/21/2015  . OSA on CPAP    uses a cpap  . Peptic ulcer with hemorrhage 03/28/2015  . Peripheral neuropathy   . Pneumonia   . Rhabdomyolysis   . 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 Stroke (HCC)    L patietal with small scattered lacunar infarcts  . Thrombocytopenia (HCC) 03/21/2015  . Venous insufficiency   . Weakness generalized 03/21/2015   Past Surgical History:  Procedure Laterality Date   . BACK SURGERY  2002   lumbosacral. 11 back surgeries total  . BRONCHIAL BIOPSY  08/06/2020   Procedure: BRONCHIAL BIOPSIES;  Surgeon: Byrum, Robert S, MD;  Location: [facility];  Service: Pulmonary;;  . BRONCHIAL BRUSHINGS  08/06/2020   Procedure: BRONCHIAL BRUSHINGS;  Surgeon: Byrum, Robert S, MD;  Location: [facility];  Service: Pulmonary;;  . BRONCHIAL NEEDLE ASPIRATION BIOPSY  08/06/2020   Procedure: BRONCHIAL NEEDLE ASPIRATION BIOPSIES;  Surgeon: Byrum, Robert S, MD;  Location: [facility];  Service: Pulmonary;;  . Carotid Doppler  03/2013   bilat bulb/prox ICAs - mild amount of fibrous plaque with no evidence of diameter reduction  .   CARPAL TUNNEL RELEASE Bilateral 08/09/2014   Procedure: BILATERAL CARPAL TUNNEL RELEASE;  Surgeon: Daryll Brod, MD;  Location: [facility];  Service: Orthopedics;  Laterality: Bilateral;  ANESTHESIA:  IV REGIONAL BIL FAB  . CHOLECYSTECTOMY    . COLONOSCOPY    . CORONARY ANGIOPLASTY  10/13/1996  . CORONARY ANGIOPLASTY  09/21/1989   emergency PTCA  . CORONARY ANGIOPLASTY  10/13/1996   Multi-Link diagonal & OD stenting (Dr. Marella Chimes)  . CORONARY ANGIOPLASTY  12/03/1997   disease of mid DX-1 ~50% & in mid PLA & PDA (distal lesions) (Dr. Marella Chimes)   . CORONARY ANGIOPLASTY  10/14/1999   progression of disease distal PLA & PDA; progression of disease prox RCA - moderate (Dr. Marella Chimes)   . CORONARY ANGIOPLASTY WITH STENT PLACEMENT  04/04/2004   4.0x38m non-DES (thrombectomy via AngioJet) to RCA for high grade stenosis (Dr. RMarella Chimes  . CORONARY ARTERY BYPASS GRAFT N/A 03/29/2015   Procedure: CORONARY ARTERY BYPASS GRAFTING TIMES TWO USING LEFT INTERNAL MAMMARY ARTERY AND RIGHT LEG GREATER SAPHENOUS VEIN HARVESTED ENDOSCOPICALLY.;  Surgeon: CRexene Alberts MD;  [facility]  Service: Open Heart Surgery;  Laterality: N/A;  . ESOPHAGOGASTRODUODENOSCOPY N/A 03/27/2015   Procedure: ESOPHAGOGASTRODUODENOSCOPY (EGD);  Surgeon: MClarene Essex MD;  [facility];  Service: Endoscopy;  Laterality: N/A;  possible dilation  . ESOPHAGOGASTRODUODENOSCOPY N/A 10/05/2020   Procedure: ESOPHAGOGASTRODUODENOSCOPY (EGD);  Surgeon: OArta Silence MD;  [facility];  Service: Endoscopy;  Laterality: N/A;  . FINE NEEDLE ASPIRATION  08/06/2020   Procedure: FINE NEEDLE ASPIRATION (FNA) LINEAR;  Surgeon: BCollene Gobble MD;  [facility];  Service: Pulmonary;;  . IR IMAGING GUIDED PORT INSERTION  10/07/2020  . LEFT HEART CATHETERIZATION WITH CORONARY ANGIOGRAM N/A 03/28/2015   Procedure: LEFT HEART CATHETERIZATION WITH CORONARY ANGIOGRAM;  Surgeon: Peter M JMartinique MD;  [facility];  Service: Cardiovascular;  Laterality: N/A;  . SINUS ENDO W/FUSION    . TONSILLECTOMY    . TRANSTHORACIC ECHOCARDIOGRAM  08/08/2013   EF 55-60%, mild conc hypertrophy, grade 1 diastolic dysfunction; AV with mild stenosis; LA & RA mildly dilated  . VIDEO BRONCHOSCOPY WITH ENDOBRONCHIAL NAVIGATION N/A 08/06/2020   Procedure: VIDEO BRONCHOSCOPY WITH ENDOBRONCHIAL NAVIGATION;  Surgeon: BCollene Gobble MD;  [facility];  Service: Pulmonary;  Laterality: N/A;  . VIDEO BRONCHOSCOPY WITH ENDOBRONCHIAL ULTRASOUND N/A 08/06/2020   Procedure: VIDEO BRONCHOSCOPY WITH ENDOBRONCHIAL ULTRASOUND;  Surgeon: BCollene Gobble MD;  [facility];  Service: Pulmonary;  Laterality: N/A;    FAMILY HISTORY Family History  Problem Relation Age of Onset  . Heart disease Mother   . Coronary artery disease Father   . Cancer Maternal Grandmother   . Heart Problems Maternal Grandfather   . Diabetes Son        borderline     SOCIAL HISTORY Social History   Tobacco Use  . Smoking status: Former Smoker    Packs/day: 2.00    Years: 20.00    Pack years: 40.00    Types: Cigarettes    Quit date: 01/25/1974    Years since quitting: 47.1  . Smokeless tobacco: Never Used  Vaping Use  . Vaping Use: Never used  Substance Use  Topics  . Alcohol use: No    Alcohol/week: 0.0 standard drinks  . Drug use: No         OPHTHALMIC EXAM: Base Eye Exam    Visual Acuity (ETDRS)      Right Left  Dist [place] 20/40 +1 20/30 -2   Dist ph [place] NI 20/30 +2       Tonometry (Tonopen, 9:30 AM)      Right Left   Pressure 14 13       Pupils      Pupils Dark Light Shape React APD   Right PERRL 2 1 Round Brisk None   Left PERRL 2 1 Round Brisk None       Extraocular Movement      Right Left    Full Full       Neuro/Psych    Oriented x3: Yes   Mood/Affect: Normal       Dilation    Right eye: 1.0% Mydriacyl, 2.5% Phenylephrine @ 9:35 AM        Slit Lamp and Fundus Exam    External Exam      Right Left   External Normal Normal       Slit Lamp Exam      Right Left   Lids/Lashes Normal Normal   Conjunctiva/Sclera White and quiet White and quiet   Cornea Clear Clear   Anterior Chamber Deep and quiet Deep and quiet   Iris Round and reactive Round and reactive   Lens Centered posterior chamber intraocular lens Centered posterior chamber intraocular lens   Anterior Vitreous Normal Normal       Fundus Exam      Right Left   Posterior Vitreous Clear, avitric    Disc Normal    C/D Ratio 0.5    Macula No topographic distortion    Vessels Normal, no DR    Periphery Normal           IMAGING AND PROCEDURES  Imaging and Procedures for 02/19/21  OCT, Retina - OU - Both Eyes       Right Eye Quality was good. Scan locations included subfoveal. Central Foveal Thickness: 335. Progression has no prior data. Findings include abnormal foveal contour.   Left Eye Quality was good. Scan locations included subfoveal. Central Foveal Thickness: 314. Progression has been stable. Findings include epiretinal membrane.   Notes Improving OD, 1 week post vitrectomy membrane peel  OS with minor epiretinal membrane in the temporal aspect of the macula, no foveal distortion                 ASSESSMENT/PLAN:  Macular pucker, right eye 1 week post vitrectomy membrane peel, macular improving, acuity improving,  Topical medications to be completed over the next 2 weeks and not refilled should they be continued before that time.      ICD-10-CM   1. Macular pucker, right eye  H35.371 OCT, Retina - OU - Both Eyes    1. O D, 1 week post vitrectomy membrane peel.  Acuity improving already.  2.  Follow-up here in 6 weeks  3.  Patient instructed to complete his medications over the next 2 weeks and no refills to be undertaken should they be bottles be emptied earlier.  Ophthalmic Meds Ordered this visit:  No orders of the defined types were placed in this encounter.      Return in about 6 weeks (around 04/02/2021) for DILATE OU, OCT, POST OP, OD.  There are no Patient Instructions on file for this visit.   Explained the diagnoses, plan, and follow up with the patient and they expressed understanding.  Patient expressed understanding of the importance of proper follow up care.   Gary A. Rankin M.D. Diseases & Surgery of the   Retina and Vitreous Retina [facility] Diabetic [place] 02/19/21     Abbreviations: M myopia (nearsighted); A astigmatism; H hyperopia (farsighted); P presbyopia; Mrx spectacle prescription;  CTL contact lenses; OD right eye; OS left eye; OU both eyes  XT exotropia; ET esotropia; PEK punctate epithelial keratitis; PEE punctate epithelial erosions; DES dry eye syndrome; MGD meibomian gland dysfunction; ATs artificial tears; PFAT's preservative free artificial tears; Vander nuclear sclerotic cataract; PSC posterior subcapsular cataract; ERM epi-retinal membrane; PVD posterior vitreous detachment; RD retinal detachment; DM diabetes mellitus; DR diabetic retinopathy; NPDR non-proliferative diabetic retinopathy; PDR proliferative diabetic retinopathy; CSME clinically significant macular edema; DME diabetic macular edema; dbh dot blot hemorrhages; CWS cotton wool spot;  POAG primary open angle glaucoma; C/D cup-to-disc ratio; HVF humphrey visual field; GVF goldmann visual field; OCT optical coherence tomography; IOP intraocular pressure; BRVO Branch retinal vein occlusion; CRVO central retinal vein occlusion; CRAO central retinal artery occlusion; BRAO branch retinal artery occlusion; RT retinal tear; SB scleral buckle; PPV pars plana vitrectomy; VH Vitreous hemorrhage; PRP panretinal laser photocoagulation; IVK intravitreal kenalog; VMT vitreomacular traction; MH Macular hole;  NVD neovascularization of the disc; NVE neovascularization elsewhere; AREDS age related eye disease study; ARMD age related macular degeneration; POAG primary open angle glaucoma; EBMD epithelial/anterior basement membrane dystrophy; ACIOL anterior chamber intraocular lens; IOL intraocular lens; PCIOL posterior chamber intraocular lens; Phaco/IOL phacoemulsification with intraocular lens placement; [place] photorefractive keratectomy; LASIK laser assisted in situ keratomileusis; HTN hypertension; DM diabetes mellitus; COPD chronic obstructive pulmonary disease

## 2021-02-21 ENCOUNTER — Other Ambulatory Visit: Payer: Self-pay | Admitting: *Deleted

## 2021-02-21 MED ORDER — FUROSEMIDE 40 MG PO TABS: 40.0000 mg | ORAL_TABLET | Freq: Two times a day (BID) | ORAL | 1 refills | Status: DC

## 2021-02-24 ENCOUNTER — Other Ambulatory Visit: Payer: Medicare Other

## 2021-02-24 ENCOUNTER — Ambulatory Visit: Payer: Medicare Other

## 2021-02-24 ENCOUNTER — Ambulatory Visit: Payer: Medicare Other | Admitting: Internal Medicine

## 2021-02-26 ENCOUNTER — Encounter (HOSPITAL_COMMUNITY): Payer: Self-pay | Admitting: Emergency Medicine

## 2021-02-26 ENCOUNTER — Other Ambulatory Visit: Payer: Self-pay

## 2021-02-26 ENCOUNTER — Inpatient Hospital Stay (HOSPITAL_COMMUNITY)
Admission: EM | Admit: 2021-02-26 | Discharge: 2021-03-03 | DRG: 308 | Disposition: A | Discharge status: Home Health | Payer: Medicare Other | Attending: Internal Medicine | Admitting: Internal Medicine

## 2021-02-26 ENCOUNTER — Emergency Department (HOSPITAL_COMMUNITY): Payer: Medicare Other

## 2021-02-26 DIAGNOSIS — I440 Atrioventricular block, first degree: Secondary | ICD-10-CM | POA: Diagnosis present

## 2021-02-26 DIAGNOSIS — I10 Essential (primary) hypertension: Secondary | ICD-10-CM | POA: Diagnosis not present

## 2021-02-26 DIAGNOSIS — Z833 Family history of diabetes mellitus: Secondary | ICD-10-CM

## 2021-02-26 DIAGNOSIS — I441 Atrioventricular block, second degree: Secondary | ICD-10-CM | POA: Diagnosis present

## 2021-02-26 DIAGNOSIS — Z79899 Other long term (current) drug therapy: Secondary | ICD-10-CM

## 2021-02-26 DIAGNOSIS — N400 Enlarged prostate without lower urinary tract symptoms: Secondary | ICD-10-CM | POA: Diagnosis present

## 2021-02-26 DIAGNOSIS — N1831 Chronic kidney disease, stage 3a: Secondary | ICD-10-CM | POA: Diagnosis present

## 2021-02-26 DIAGNOSIS — F0390 Unspecified dementia without behavioral disturbance: Secondary | ICD-10-CM | POA: Diagnosis present

## 2021-02-26 DIAGNOSIS — Z8249 Family history of ischemic heart disease and other diseases of the circulatory system: Secondary | ICD-10-CM | POA: Diagnosis not present

## 2021-02-26 DIAGNOSIS — I498 Other specified cardiac arrhythmias: Secondary | ICD-10-CM

## 2021-02-26 DIAGNOSIS — R21 Rash and other nonspecific skin eruption: Secondary | ICD-10-CM | POA: Diagnosis present

## 2021-02-26 DIAGNOSIS — R0989 Other specified symptoms and signs involving the circulatory and respiratory systems: Secondary | ICD-10-CM | POA: Diagnosis not present

## 2021-02-26 DIAGNOSIS — I25119 Atherosclerotic heart disease of native coronary artery with unspecified angina pectoris: Secondary | ICD-10-CM | POA: Diagnosis not present

## 2021-02-26 DIAGNOSIS — R531 Weakness: Secondary | ICD-10-CM | POA: Diagnosis not present

## 2021-02-26 DIAGNOSIS — I2729 Other secondary pulmonary hypertension: Secondary | ICD-10-CM | POA: Diagnosis not present

## 2021-02-26 DIAGNOSIS — Z20822 Contact with and (suspected) exposure to covid-19: Secondary | ICD-10-CM | POA: Diagnosis not present

## 2021-02-26 DIAGNOSIS — R001 Bradycardia, unspecified: Secondary | ICD-10-CM | POA: Diagnosis not present

## 2021-02-26 DIAGNOSIS — I50810 Right heart failure, unspecified: Secondary | ICD-10-CM | POA: Diagnosis not present

## 2021-02-26 DIAGNOSIS — E860 Dehydration: Secondary | ICD-10-CM | POA: Diagnosis present

## 2021-02-26 DIAGNOSIS — E1122 Type 2 diabetes mellitus with diabetic chronic kidney disease: Secondary | ICD-10-CM | POA: Diagnosis present

## 2021-02-26 DIAGNOSIS — I130 Hypertensive heart and chronic kidney disease with heart failure and stage 1 through stage 4 chronic kidney disease, or unspecified chronic kidney disease: Secondary | ICD-10-CM | POA: Diagnosis present

## 2021-02-26 DIAGNOSIS — Z809 Family history of malignant neoplasm, unspecified: Secondary | ICD-10-CM | POA: Diagnosis not present

## 2021-02-26 DIAGNOSIS — E876 Hypokalemia: Secondary | ICD-10-CM | POA: Diagnosis not present

## 2021-02-26 DIAGNOSIS — G4733 Obstructive sleep apnea (adult) (pediatric): Secondary | ICD-10-CM | POA: Diagnosis not present

## 2021-02-26 DIAGNOSIS — Z79890 Hormone replacement therapy (postmenopausal): Secondary | ICD-10-CM | POA: Diagnosis not present

## 2021-02-26 DIAGNOSIS — E669 Obesity, unspecified: Secondary | ICD-10-CM | POA: Diagnosis present

## 2021-02-26 DIAGNOSIS — Z888 Allergy status to other drugs, medicaments and biological substances status: Secondary | ICD-10-CM

## 2021-02-26 DIAGNOSIS — E039 Hypothyroidism, unspecified: Secondary | ICD-10-CM | POA: Diagnosis present

## 2021-02-26 DIAGNOSIS — I480 Paroxysmal atrial fibrillation: Secondary | ICD-10-CM

## 2021-02-26 DIAGNOSIS — I2510 Atherosclerotic heart disease of native coronary artery without angina pectoris: Secondary | ICD-10-CM | POA: Diagnosis not present

## 2021-02-26 DIAGNOSIS — E1142 Type 2 diabetes mellitus with diabetic polyneuropathy: Secondary | ICD-10-CM | POA: Diagnosis present

## 2021-02-26 DIAGNOSIS — R008 Other abnormalities of heart beat: Secondary | ICD-10-CM | POA: Diagnosis present

## 2021-02-26 DIAGNOSIS — Z794 Long term (current) use of insulin: Secondary | ICD-10-CM | POA: Diagnosis not present

## 2021-02-26 DIAGNOSIS — Z6834 Body mass index (BMI) 34.0-34.9, adult: Secondary | ICD-10-CM

## 2021-02-26 DIAGNOSIS — Z8673 Personal history of transient ischemic attack (TIA), and cerebral infarction without residual deficits: Secondary | ICD-10-CM | POA: Diagnosis not present

## 2021-02-26 DIAGNOSIS — Z951 Presence of aortocoronary bypass graft: Secondary | ICD-10-CM

## 2021-02-26 DIAGNOSIS — Z9049 Acquired absence of other specified parts of digestive tract: Secondary | ICD-10-CM | POA: Diagnosis not present

## 2021-02-26 DIAGNOSIS — Z87891 Personal history of nicotine dependence: Secondary | ICD-10-CM

## 2021-02-26 DIAGNOSIS — T441X5A Adverse effect of other parasympathomimetics [cholinergics], initial encounter: Secondary | ICD-10-CM | POA: Diagnosis not present

## 2021-02-26 DIAGNOSIS — I252 Old myocardial infarction: Secondary | ICD-10-CM | POA: Diagnosis not present

## 2021-02-26 DIAGNOSIS — J90 Pleural effusion, not elsewhere classified: Secondary | ICD-10-CM | POA: Diagnosis not present

## 2021-02-26 DIAGNOSIS — R0600 Dyspnea, unspecified: Secondary | ICD-10-CM | POA: Diagnosis not present

## 2021-02-26 DIAGNOSIS — Y92009 Unspecified place in unspecified non-institutional (private) residence as the place of occurrence of the external cause: Secondary | ICD-10-CM

## 2021-02-26 DIAGNOSIS — E1129 Type 2 diabetes mellitus with other diabetic kidney complication: Secondary | ICD-10-CM | POA: Diagnosis not present

## 2021-02-26 DIAGNOSIS — H9190 Unspecified hearing loss, unspecified ear: Secondary | ICD-10-CM | POA: Diagnosis not present

## 2021-02-26 DIAGNOSIS — N183 Chronic kidney disease, stage 3 unspecified: Secondary | ICD-10-CM | POA: Diagnosis not present

## 2021-02-26 DIAGNOSIS — I5043 Acute on chronic combined systolic (congestive) and diastolic (congestive) heart failure: Secondary | ICD-10-CM | POA: Diagnosis present

## 2021-02-26 DIAGNOSIS — C3491 Malignant neoplasm of unspecified part of right bronchus or lung: Secondary | ICD-10-CM | POA: Diagnosis present

## 2021-02-26 DIAGNOSIS — I350 Nonrheumatic aortic (valve) stenosis: Secondary | ICD-10-CM | POA: Diagnosis not present

## 2021-02-26 DIAGNOSIS — E785 Hyperlipidemia, unspecified: Secondary | ICD-10-CM | POA: Diagnosis present

## 2021-02-26 DIAGNOSIS — I959 Hypotension, unspecified: Secondary | ICD-10-CM | POA: Diagnosis not present

## 2021-02-26 DIAGNOSIS — I361 Nonrheumatic tricuspid (valve) insufficiency: Secondary | ICD-10-CM | POA: Diagnosis not present

## 2021-02-26 DIAGNOSIS — Z955 Presence of coronary angioplasty implant and graft: Secondary | ICD-10-CM

## 2021-02-26 DIAGNOSIS — Z7901 Long term (current) use of anticoagulants: Secondary | ICD-10-CM

## 2021-02-26 DIAGNOSIS — I493 Ventricular premature depolarization: Secondary | ICD-10-CM | POA: Diagnosis not present

## 2021-02-26 LAB — CBC
HCT: 39.5 % (ref 39.0–52.0)
Hemoglobin: 11.8 g/dL — ABNORMAL LOW (ref 13.0–17.0)
MCH: 28.1 pg (ref 26.0–34.0)
MCHC: 29.9 g/dL — ABNORMAL LOW (ref 30.0–36.0)
MCV: 94 fL (ref 80.0–100.0)
Platelets: 131 10*3/uL — ABNORMAL LOW (ref 150–400)
RBC: 4.2 MIL/uL — ABNORMAL LOW (ref 4.22–5.81)
RDW: 15.3 % (ref 11.5–15.5)
WBC: 5.6 10*3/uL (ref 4.0–10.5)
nRBC: 0 % (ref 0.0–0.2)

## 2021-02-26 LAB — CBG MONITORING, ED
Glucose-Capillary: 104 mg/dL — ABNORMAL HIGH (ref 70–99)
Glucose-Capillary: 74 mg/dL (ref 70–99)
Glucose-Capillary: 139 mg/dL — ABNORMAL HIGH (ref 70–99)

## 2021-02-26 LAB — BASIC METABOLIC PANEL
Anion gap: 8 (ref 5–15)
BUN: 22 mg/dL (ref 8–23)
CO2: 29 mmol/L (ref 22–32)
Calcium: 8.9 mg/dL (ref 8.9–10.3)
Chloride: 103 mmol/L (ref 98–111)
Creatinine, Ser: 1.46 mg/dL — ABNORMAL HIGH (ref 0.61–1.24)
GFR, Estimated: 48 mL/min — ABNORMAL LOW (ref >60)
Glucose, Bld: 114 mg/dL — ABNORMAL HIGH (ref 70–99)
Potassium: 3.8 mmol/L (ref 3.5–5.1)
Sodium: 140 mmol/L (ref 135–145)

## 2021-02-26 LAB — URINALYSIS, ROUTINE W REFLEX MICROSCOPIC
Bacteria, UA: NONE SEEN
Bilirubin Urine: NEGATIVE
Glucose, UA: NEGATIVE mg/dL
Ketones, ur: NEGATIVE mg/dL
Leukocytes,Ua: NEGATIVE
Nitrite: NEGATIVE
Protein, ur: 100 mg/dL — AB
Specific Gravity, Urine: 1.009 (ref 1.005–1.030)
pH: 6 (ref 5.0–8.0)

## 2021-02-26 LAB — RESPIRATORY PANEL BY PCR

## 2021-02-26 LAB — TSH: TSH: 1.184 u[IU]/mL (ref 0.350–4.500)

## 2021-02-26 LAB — MAGNESIUM: Magnesium: 1.1 mg/dL — ABNORMAL LOW (ref 1.7–2.4)

## 2021-02-26 LAB — HEMOGLOBIN A1C
Hgb A1c MFr Bld: 6.7 % — ABNORMAL HIGH (ref 4.8–5.6)
Mean Plasma Glucose: 145.59 mg/dL

## 2021-02-26 MED ORDER — MAGNESIUM SULFATE 2 GM/50ML IV SOLN
2.0000 g | Freq: Once | INTRAVENOUS | Status: AC
  Administered 2021-02-26: 2 g via INTRAVENOUS
  Filled 2021-02-26: qty 50

## 2021-02-26 MED ORDER — INSULIN DEGLUDEC 200 UNIT/ML ~~LOC~~ SOPN: 20.0000 [IU] | PEN_INJECTOR | Freq: Every day | SUBCUTANEOUS | Status: DC

## 2021-02-26 MED ORDER — ACETAMINOPHEN 650 MG RE SUPP: 650.0000 mg | Freq: Four times a day (QID) | RECTAL | Status: DC | PRN

## 2021-02-26 MED ORDER — MAGNESIUM SULFATE IN D5W 1-5 GM/100ML-% IV SOLN
1.0000 g | Freq: Once | INTRAVENOUS | Status: AC
  Administered 2021-02-26: 1 g via INTRAVENOUS
  Filled 2021-02-26: qty 100

## 2021-02-26 MED ORDER — LACTATED RINGERS IV SOLN: INTRAVENOUS | Status: DC

## 2021-02-26 MED ORDER — ALBUTEROL SULFATE HFA 108 (90 BASE) MCG/ACT IN AERS
2.0000 | INHALATION_SPRAY | Freq: Four times a day (QID) | RESPIRATORY_TRACT | Status: DC | PRN
  Administered 2021-02-27: 2 via RESPIRATORY_TRACT
  Filled 2021-02-26: qty 6.7

## 2021-02-26 MED ORDER — APIXABAN 2.5 MG PO TABS
2.5000 mg | ORAL_TABLET | Freq: Two times a day (BID) | ORAL | Status: DC
  Administered 2021-02-27 – 2021-03-03 (×9): 2.5 mg via ORAL
  Filled 2021-02-26 (×11): qty 1

## 2021-02-26 MED ORDER — ACETAMINOPHEN 325 MG PO TABS: 650.0000 mg | ORAL_TABLET | Freq: Four times a day (QID) | ORAL | Status: DC | PRN

## 2021-02-26 MED ORDER — TIOTROPIUM BROMIDE MONOHYDRATE 2.5 MCG/ACT IN AERS: 2.0000 | INHALATION_SPRAY | Freq: Every day | RESPIRATORY_TRACT | Status: DC

## 2021-02-26 MED ORDER — VITAMIN B-12 1000 MCG PO TABS
1000.0000 ug | ORAL_TABLET | Freq: Every day | ORAL | Status: DC
  Administered 2021-02-27 – 2021-03-03 (×5): 1000 ug via ORAL
  Filled 2021-02-26 (×5): qty 1

## 2021-02-26 MED ORDER — ONDANSETRON HCL 4 MG/2ML IJ SOLN: 4.0000 mg | Freq: Four times a day (QID) | INTRAMUSCULAR | Status: DC | PRN

## 2021-02-26 MED ORDER — MAGNESIUM CITRATE PO SOLN: 1.0000 | Freq: Once | ORAL | Status: DC | PRN

## 2021-02-26 MED ORDER — TAMSULOSIN HCL 0.4 MG PO CAPS
0.4000 mg | ORAL_CAPSULE | Freq: Every day | ORAL | Status: DC
  Administered 2021-02-26 – 2021-03-03 (×6): 0.4 mg via ORAL
  Filled 2021-02-26 (×6): qty 1

## 2021-02-26 MED ORDER — LEVOTHYROXINE SODIUM 75 MCG PO TABS
75.0000 ug | ORAL_TABLET | Freq: Every day | ORAL | Status: DC
  Administered 2021-02-27 – 2021-03-03 (×5): 75 ug via ORAL
  Filled 2021-02-26 (×5): qty 1

## 2021-02-26 MED ORDER — INSULIN GLARGINE 100 UNIT/ML ~~LOC~~ SOLN
20.0000 [IU] | Freq: Every day | SUBCUTANEOUS | Status: DC
  Administered 2021-02-26 – 2021-03-02 (×5): 20 [IU] via SUBCUTANEOUS
  Filled 2021-02-26 (×6): qty 0.2

## 2021-02-26 MED ORDER — AMLODIPINE BESYLATE 10 MG PO TABS
10.0000 mg | ORAL_TABLET | Freq: Every day | ORAL | Status: DC
  Administered 2021-02-26 – 2021-03-03 (×6): 10 mg via ORAL
  Filled 2021-02-26: qty 2
  Filled 2021-02-26 (×3): qty 1
  Filled 2021-02-26: qty 2
  Filled 2021-02-26: qty 1

## 2021-02-26 MED ORDER — ALLOPURINOL 100 MG PO TABS
100.0000 mg | ORAL_TABLET | Freq: Every day | ORAL | Status: DC
  Administered 2021-02-27 – 2021-03-03 (×5): 100 mg via ORAL
  Filled 2021-02-26 (×5): qty 1

## 2021-02-26 MED ORDER — HYDRALAZINE HCL 20 MG/ML IJ SOLN
10.0000 mg | Freq: Four times a day (QID) | INTRAMUSCULAR | Status: DC | PRN
  Administered 2021-02-27 – 2021-03-03 (×2): 10 mg via INTRAVENOUS
  Filled 2021-02-26 (×2): qty 1

## 2021-02-26 MED ORDER — INSULIN ASPART 100 UNIT/ML ~~LOC~~ SOLN
0.0000 [IU] | Freq: Three times a day (TID) | SUBCUTANEOUS | Status: DC
  Administered 2021-02-27: 1 [IU] via SUBCUTANEOUS
  Administered 2021-02-28: 2 [IU] via SUBCUTANEOUS
  Administered 2021-02-28: 1 [IU] via SUBCUTANEOUS
  Administered 2021-03-01 (×2): 5 [IU] via SUBCUTANEOUS
  Administered 2021-03-02: 2 [IU] via SUBCUTANEOUS
  Administered 2021-03-02: 5 [IU] via SUBCUTANEOUS
  Administered 2021-03-02 – 2021-03-03 (×3): 2 [IU] via SUBCUTANEOUS

## 2021-02-26 MED ORDER — INSULIN ASPART 100 UNIT/ML ~~LOC~~ SOLN: 0.0000 [IU] | Freq: Every day | SUBCUTANEOUS | Status: DC

## 2021-02-26 MED ORDER — ONDANSETRON HCL 4 MG PO TABS: 4.0000 mg | ORAL_TABLET | Freq: Four times a day (QID) | ORAL | Status: DC | PRN

## 2021-02-26 MED ORDER — UMECLIDINIUM-VILANTEROL 62.5-25 MCG/INH IN AEPB
1.0000 | INHALATION_SPRAY | Freq: Every day | RESPIRATORY_TRACT | Status: DC
  Administered 2021-02-28 – 2021-03-03 (×4): 1 via RESPIRATORY_TRACT
  Filled 2021-02-26: qty 14

## 2021-02-26 MED ORDER — SOTORASIB 120 MG PO TABS
960.0000 mg | ORAL_TABLET | Freq: Every morning | ORAL | Status: DC
  Administered 2021-03-02 – 2021-03-03 (×2): 960 mg via ORAL
  Filled 2021-02-26 (×2): qty 8

## 2021-02-26 NOTE — ED Triage Notes (Signed)
Arrived via EMS from home. Patient lives with wife has PT and home health check on patient. Today PT states heart rate was in the 20's via pulse oximetry. EMS called stated HR upper 40's - 70's irregular. Patient complaint feeling tired and normally on 2L [place] pulse oximetry 100%  Alert answering and following commands appropriate.

## 2021-02-26 NOTE — ED Notes (Signed)
Patient transported to X-ray 

## 2021-02-26 NOTE — H&P (Signed)
TRH H&P   Patient Demographics:    Randall Wilkins, is a 80 y.o. male  MRN: [id number]   DOB - 1941/03/09  Admit Date - 02/26/2021  Outpatient Primary MD for the patient is [name], MD  Outpatient Specialists: Dr Debara Pickett and Dr Inda Merlin    Patient coming from: Home  Chief Complaint  Patient presents with  . Fatigue  . Bradycardia      HPI:    Randall Wilkins  is a 80 y.o. 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 Curt Bears on daily oral chemotherapy, paroxysmal A. fib [place] vas 2 score of 6, CAD s/p CABG, stroke in the past, CKD 3 a baseline creatinine around 1.4, hypertension, dyslipidemia, DM type II, aortic valve stenosis, combined chronic systolic and diastolic heart failure last EF around 45%, left leg weakness after bout of severe rhabdomyolysis wears a brace, memory dysfunction placed on Aricept.  Patient was in his usual state of health but was feeling weak since this morning, he had a good visit with his home health nurse yesterday, today home PT came to work with the patient and found that his heart rate was in mid 30s, he was then brought to the ER where sinus bradycardia was confirmed, he was feeling generally weak, cardiology was consulted and we were requested to admit the patient.  Today besides generalized weakness he has no other subjective complaints, no fever chills, no headache, no chest or abdominal pain, no new focal weakness, does have some chronic left lower extremity weakness, no diarrhea or dysuria.    Review of systems:    A full 10 point Review of Systems was done, except as stated above,  all other Review of Systems were negative.   With Past History of the following :    Past Medical History:  Diagnosis Date  . Aortic stenosis, mild 11/14/2013  . Aortic valve sclerosis 03/29/2015  . Bilateral leg edema 05/21/2014  . CAD (coronary artery disease)   . Chronic diastolic congestive heart failure ([place])   . Chronic kidney disease (CKD), stage III (moderate) (HCC)   . Diabetes 1.5, managed as type 1 ([place]) 02/04/2013  . Diabetic peripheral neuropathy associated with type 1 diabetes mellitus ([place]) 11/14/2013  . Dysphagia   .  Dyspnea on exertion 03/21/2015  . Exogenous obesity   . Gout   . Heart attack ([place])   . History of nuclear stress test 07/2011   dipyridamole; fixed inferolateral defect, worse at stress than rest; no reversible ischemia; low risk scan   . Hyperlipidemia   . Hypertension   . Hypothyroidism   . Insulin dependent diabetes mellitus   . Left foot drop   . Left main coronary artery disease 03/28/2015  . lung ca dx'd 08/2020  . Memory loss   . Obesity (BMI 30-39.9) 11/14/2013  . Obstructive sleep apnea 03/21/2015  . OSA on CPAP    uses a cpap  . Peptic ulcer with hemorrhage 03/28/2015  . Peripheral neuropathy   . Pneumonia   . Rhabdomyolysis   . 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 Stroke ([place])    L patietal with small scattered lacunar infarcts  . Thrombocytopenia ([place]) 03/21/2015  . Venous insufficiency   . Weakness generalized 03/21/2015      Past Surgical History:  Procedure Laterality Date  . BACK SURGERY  2002   lumbosacral. 11 back surgeries total  . BRONCHIAL BIOPSY  08/06/2020   Procedure: BRONCHIAL BIOPSIES;  Surgeon: Collene Gobble, MD;  Location: [facility] ENDOSCOPY;  Service: Pulmonary;;  . BRONCHIAL BRUSHINGS  08/06/2020   Procedure: BRONCHIAL BRUSHINGS;  Surgeon: Collene Gobble, MD;  Location[facility] ENDOSCOPY;  Service: Pulmonary;;  . BRONCHIAL NEEDLE ASPIRATION BIOPSY  08/06/2020   Procedure: BRONCHIAL NEEDLE  ASPIRATION BIOPSIES;  Surgeon: Collene Gobble, MD;  Location: [facility] ENDOSCOPY;  Service: Pulmonary;;  . Carotid Doppler  03/2013   bilat bulb/prox ICAs - mild amount of fibrous plaque with no evidence of diameter reduction  . CARPAL TUNNEL RELEASE Bilateral 08/09/2014   Procedure: BILATERAL CARPAL TUNNEL RELEASE;  Surgeon: Daryll Brod, MD;  Location: [place];  Service: Orthopedics;  Laterality: Bilateral;  ANESTHESIA:  IV REGIONAL BIL FAB  . CHOLECYSTECTOMY    . COLONOSCOPY    . CORONARY ANGIOPLASTY  10/13/1996  . CORONARY ANGIOPLASTY  09/21/1989   emergency PTCA  . CORONARY ANGIOPLASTY  10/13/1996   Multi-Link diagonal & OD stenting (Dr. Marella Chimes)  . CORONARY ANGIOPLASTY  12/03/1997   disease of mid DX-1 ~50% & in mid PLA & PDA (distal lesions) (Dr. Marella Chimes)   . CORONARY ANGIOPLASTY  10/14/1999   progression of disease distal PLA & PDA; progression of disease prox RCA - moderate (Dr. Marella Chimes)   . CORONARY ANGIOPLASTY WITH STENT PLACEMENT  04/04/2004   4.0x33m non-DES (thrombectomy via AngioJet) to RCA for high grade stenosis (Dr. RMarella Chimes  . CORONARY ARTERY BYPASS GRAFT N/A 03/29/2015   Procedure: CORONARY ARTERY BYPASS GRAFTING TIMES TWO USING LEFT INTERNAL MAMMARY ARTERY AND RIGHT LEG GREATER SAPHENOUS VEIN HARVESTED ENDOSCOPICALLY.;  Surgeon: CRexene Alberts MD;  [place]  Service: Open Heart Surgery;  Laterality: N/A;  . ESOPHAGOGASTRODUODENOSCOPY N/A 03/27/2015   Procedure: ESOPHAGOGASTRODUODENOSCOPY (EGD);  Surgeon: MClarene Essex MD;  [facility];  Service: Endoscopy;  Laterality: N/A;  possible dilation  . ESOPHAGOGASTRODUODENOSCOPY N/A 10/05/2020   Procedure: ESOPHAGOGASTRODUODENOSCOPY (EGD);  Surgeon: OArta Silence MD;  [facility];  Service: Endoscopy;  Laterality: N/A;  . FINE NEEDLE ASPIRATION  08/06/2020   Procedure: FINE NEEDLE ASPIRATION (FNA) LINEAR;  Surgeon: BCollene Gobble MD;  [facility];  Service:  Pulmonary;;  . IR IMAGING GUIDED PORT INSERTION  10/07/2020  .  LEFT HEART CATHETERIZATION WITH CORONARY ANGIOGRAM N/A 03/28/2015   Procedure: LEFT HEART CATHETERIZATION WITH CORONARY ANGIOGRAM;  Surgeon: Peter M [place]ique, MD;  Location: [facility] CATH LAB;  Service: Cardiovascular;  Laterality: N/A;  . SINUS ENDO W/FUSION    . TONSILLECTOMY    . TRANSTHORACIC ECHOCARDIOGRAM  08/08/2013   EF 55-60%, mild conc hypertrophy, grade 1 diastolic dysfunction; AV with mild stenosis; LA & RA mildly dilated  . VIDEO BRONCHOSCOPY WITH ENDOBRONCHIAL NAVIGATION N/A 08/06/2020   Procedure: VIDEO BRONCHOSCOPY WITH ENDOBRONCHIAL NAVIGATION;  Surgeon: Collene Gobble, MD;  Location: [place] ENDOSCOPY;  Service: Pulmonary;  Laterality: N/A;  . VIDEO BRONCHOSCOPY WITH ENDOBRONCHIAL ULTRASOUND N/A 08/06/2020   Procedure: VIDEO BRONCHOSCOPY WITH ENDOBRONCHIAL ULTRASOUND;  Surgeon: Collene Gobble, MD;  Location: [facility] ENDOSCOPY;  Service: Pulmonary;  Laterality: N/A;      Social History:     Social History   Tobacco Use  . Smoking status: Former Smoker    Packs/day: 2.00    Years: 20.00    Pack years: 40.00    Types: Cigarettes    Quit date: 01/25/1974    Years since quitting: 47.1  . Smokeless tobacco: Never Used  Substance Use Topics  . Alcohol use: No    Alcohol/week: 0.0 standard drinks         Family History :     Family History  Problem Relation Age of Onset  . Heart disease Mother   . Coronary artery disease Father   . Cancer Maternal Grandmother   . Heart Problems Maternal Grandfather   . Diabetes Son        borderline        Home Medications:   Prior to Admission medications   Medication Sig Start Date End Date Taking? Authorizing Provider  albuterol (VENTOLIN HFA) 108 (90 Base) MCG/ACT inhaler Inhale 2 puffs into the lungs every 6 (six) hours as needed for wheezing or shortness of breath. 09/04/20   Collene Gobble, MD  allopurinol (ZYLOPRIM) 100 MG tablet Take 1 tablet (100 mg total) by mouth  daily. 10/18/20   Hilty, Nadean Corwin, MD  amitriptyline (ELAVIL) 75 MG tablet Take 75 mg by mouth at bedtime. 12/10/20   [provider]  apixaban (ELIQUIS) 2.5 MG TABS tablet Take 1 tablet (2.5 mg total) by mouth 2 (two) times daily. 10/18/20   Hilty, Nadean Corwin, MD  Blood Glucose Monitoring Suppl (CONTOUR NEXT MONITOR) w/Device KIT 1 Device by Does not apply route 3 (three) times daily. Use to check blood sugars 3 times daily. Dx Code E13.9 07/29/17   Elayne Snare, MD  CONTOUR NEXT TEST test strip USE 1 STRIP TO CHECK GLUCOSE 4 TIMES DAILY 10/13/20   Elayne Snare, MD  donepezil (ARICEPT) 10 MG tablet Take 1 tablet (10 mg total) by mouth daily. 12/23/20   Marcial Pacas, MD  furosemide (LASIX) 40 MG tablet Take 1 tablet (40 mg total) by mouth 2 (two) times daily. 02/21/21   Hilty, Nadean Corwin, MD  insulin degludec (TRESIBA FLEXTOUCH) 200 UNIT/ML FlexTouch Pen Inject 24 Units into the skin at bedtime. Patient taking differently: Inject 20 Units into the skin at bedtime. 24 units on Chemo days 10/08/20   Raiford Noble Latif, DO  insulin lispro (HUMALOG) 100 UNIT/ML cartridge Inject 5-15 Units into the skin 3 (three) times daily with meals. Sliding scale.    [provider]  Insulin Pen Needle (EASY COMFORT PEN NEEDLES) 33G X 4 MM MISC 1 each by Does not apply route See admin  instructions. Use to inject insulin 5 times daily. 05/31/19   Elayne Snare, MD  levothyroxine (SYNTHROID, LEVOTHROID) 75 MCG tablet Take 75 mcg by mouth daily before breakfast.  05/14/17   [provider]  lidocaine (XYLOCAINE) 2 % injection See admin instructions.    [provider]  lidocaine (XYLOCAINE) 2 % solution Use as directed 15 mLs in the mouth or throat as needed for mouth pain. 09/19/20   Kinnie Feil, PA-C  lidocaine-prilocaine (EMLA) cream Apply 1 application topically as needed. Apply a teaspoon over port site at least 1 hour prior to lab appt. Do not rub in and cover with plastic wrap. 09/30/20    Curt Bears, MD  lisinopril (ZESTRIL) 10 MG tablet TAKE ONE TABLET DAILY Patient taking differently: Take 10 mg by mouth daily. 08/12/20   Elayne Snare, MD  metFORMIN (GLUCOPHAGE-XR) 750 MG 24 hr tablet TAKE 1 TABLET WITH DINNER 11/11/20   Elayne Snare, MD  metoprolol succinate (TOPROL-XL) 25 MG 24 hr tablet Take 1 tablet (25 mg total) by mouth daily. 03/13/20   Hilty, Nadean Corwin, MD  Nutritional Supplements (ENSURE MAX PROTEIN PO) Take by mouth.    [provider]  ondansetron (ZOFRAN) 4 MG tablet Take 1 tablet (4 mg total) by mouth every 6 (six) hours as needed for nausea. 10/08/20   Raiford Noble Latif, DO  OXYGEN Inhale 2 L into the lungs. At bedtime and during the day prn    [provider]  potassium chloride SA (K-DUR,KLOR-CON) 20 MEQ tablet Take 20 mEq by mouth 2 (two) times daily.     [provider]  prednisoLONE acetate (PRED FORTE) 1 % ophthalmic suspension Place 1 drop into the right eye 4 (four) times daily. 02/05/21   Rankin, Clent Demark, MD  prochlorperazine (COMPAZINE) 10 MG tablet Take 1 tablet (10 mg total) by mouth every 6 (six) hours as needed for nausea or vomiting. 09/02/20   Curt Bears, MD  rosuvastatin (CRESTOR) 20 MG tablet Take 20 mg by mouth daily.    [provider]  sotorasib (LUMAKRAS) 120 MG TABS Take 960 mg by mouth every morning. 01/13/21   Curt Bears, MD  Tamsulosin HCl (FLOMAX) 0.4 MG CAPS Take 0.4 mg by mouth daily.     [provider]  Tiotropium Bromide Monohydrate (SPIRIVA RESPIMAT) 2.5 MCG/ACT AERS Inhale 2 puffs into the lungs daily. 02/06/21   Collene Gobble, MD  vitamin B-12 (CYANOCOBALAMIN) 1000 MCG tablet Take 1,000 mcg by mouth daily.    [provider]     Allergies:     Allergies  Allergen Reactions  . Actos [Pioglitazone] Swelling  . Metformin And Related Nausea Only    3.16.2022 pt is currently taking this medication at home  . Niaspan [Niacin Er] Itching and Rash  . Wound Dressing  Adhesive Rash     Physical Exam:   Vitals  Blood pressure (!) 160/51, pulse 68, temperature (!) 97.5 F (36.4 C), temperature source Oral, resp. rate (!) 24, height '5\' 7"'  (1.702 m), weight 100.7 kg, SpO2 99 %.   1. General early Caucasian male lying in hospital bed in no apparent discomfort, right IJ Port-A-Cath appears stable and clean, wearing brace in the left lower extremity  2. Normal affect and insight, Not Suicidal or Homicidal, Awake Alert,   3. No F.N deficits, ALL C.Nerves Intact, Strength 5/5 all 4 extremities, Sensation intact all 4 extremities, Plantars down going.  4. Ears and Eyes appear Normal, Conjunctivae clear, PERRLA. Moist  Oral Mucosa.  5. Supple Neck, No JVD, No cervical lymphadenopathy appriciated, No Carotid Bruits.  6. Symmetrical Chest wall movement, Good air movement bilaterally, CTAB.  7. RRR, No Gallops, Rubs or Murmurs, No Parasternal Heave.  8. Positive Bowel Sounds, Abdomen Soft, No tenderness, No organomegaly appriciated,No rebound -guarding or rigidity.  9.  No Cyanosis, Normal Skin Turgor, No Skin Rash or Bruise.  10. Good muscle tone,  joints appear normal , no effusions, Normal ROM.  11. No Palpable Lymph Nodes in Neck or Axillae      Data Review:    CBC Recent Labs  Lab 02/26/21 1221  WBC 5.6  HGB 11.8*  HCT 39.5  PLT 131*  MCV 94.0  MCH 28.1  MCHC 29.9*  RDW 15.3   ------------------------------------------------------------------------------------------------------------------  Chemistries  Recent Labs  Lab 02/26/21 1221  NA 140  K 3.8  CL 103  CO2 29  GLUCOSE 114*  BUN 22  CREATININE 1.46*  CALCIUM 8.9   ------------------------------------------------------------------------------------------------------------------ estimated creatinine clearance is 45.6 mL/min (A) (by C-G formula based on SCr of 1.46 mg/dL  (H)). ------------------------------------------------------------------------------------------------------------------ No results for input(s): TSH, T4TOTAL, T3FREE, THYROIDAB in the last 72 hours.  Invalid input(s): FREET3  Coagulation profile No results for input(s): INR, PROTIME in the last 168 hours. ------------------------------------------------------------------------------------------------------------------- No results for input(s): DDIMER in the last 72 hours. -------------------------------------------------------------------------------------------------------------------  Cardiac Enzymes No results for input(s): CKMB, TROPONINI, MYOGLOBIN in the last 168 hours.  Invalid input(s): CK ------------------------------------------------------------------------------------------------------------------    Component Value Date/Time   BNP 877.5 (H) 02/18/2021 1018   BNP 344.4 (H) 10/02/2020 1747     ---------------------------------------------------------------------------------------------------------------  Urinalysis    Component Value Date/Time   COLORURINE YELLOW 02/26/2021 [place] 02/26/2021 1221   LABSPEC 1.009 02/26/2021 1221   PHURINE 6.0 02/26/2021 1221   [place] 02/26/2021 1221   GLUCOSEU NEGATIVE 08/29/2019 0959   HGBUR SMALL (A) 02/26/2021 1221   BILIRUBINUR NEGATIVE 02/26/2021 1221   KETONESUR NEGATIVE 02/26/2021 1221   PROTEINUR 100 (A) 02/26/2021 1221   UROBILINOGEN 0.2 08/29/2019 0959   NITRITE NEGATIVE 02/26/2021 1221   LEUKOCYTESUR NEGATIVE 02/26/2021 1221    ----------------------------------------------------------------------------------------------------------------   Imaging Results:    DG Chest 2 View  Result Date: 02/26/2021 CLINICAL DATA:  Weakness on home oxygen. EXAM: CHEST - 2 VIEW COMPARISON:  November 05, 2020 FINDINGS: RIGHT IJ Port-A-Cath in situ. Post median sternotomy for CABG. Heart size remains  enlarged. Opacity at the RIGHT lung base and LEFT mid chest as on the prior study. Potentially diminished effusion in the RIGHT chest compared to previous imaging. On limited assessment no acute skeletal process. IMPRESSION: 1. Opacity at the RIGHT lung base and LEFT mid chest, similar to the prior study. Patient with bilateral lung masses. No definite acute process. Difficult to exclude superimposed pneumonia given pattern of disease. 2. Potentially diminished RIGHT pleural effusion. Electronically Signed   By: Zetta Bills M.D.   On: 02/26/2021 13:28    My personal review of EKG: Rhythm NSR, sinus bradycardia heart rate in mid 50s.   Assessment & Plan:     1.  Symptomatic bradycardia.  This could be due to combination of atrial fib with intermittent Mobitz type II heart block in the past, hypothyroidism, beta-blocker use and recent Aricept use.  He will be kept in 23-hour observation, offending medications which include beta-blocker and Aricept will be held, check TSH for now continue home dose Synthroid,  cardiology has been consulted, monitor on telemetry, PT OT and advance  activity with chronotropic blood pressure response.  2.  Paroxysmal A. fib with [place] vas 2 score of 6.  With last cardiology note suggesting some intermittent Mobitz type II heart block, question if he has underlying sick sinus syndrome, for now continue Eliquis for anticoagulation, hold beta-blocker, cardiology to evaluate, telemetry monitoring.  3.  CAD s/p CABG.  Currently chest pain-free with nonacute EKG, continue Eliquis hold beta-blocker.   4.  Hypothyroidism.  Check TSH continue home dose Synthroid.  5.  BPH.  Continue home dose alpha-blocker.  6.  Rhabdomyolysis induced left leg muscular injury causing left leg weakness.  Continue wearing brace when out of bed, PT OT.  7.  Previous history of stroke.  Supportive care.  Continue Eliquis.  8.  Stage IV non-small cell lung cancer -continue oral  chemotherapy, Dr. Julien Nordmann has been informed.  9.  Obesity with OSA.  Follow with PCP for weight loss BMI 34, nighttime oxygen to be continued when he sleeps.  10.  CKD stage IIIa.  Creatinine appears to be close to baseline.  Will monitor.  11. Dehydration.  Gentle hydration for 12 hours with IV fluids.  13.  Chronic combined systolic and diastolic heart failure last EF around 45%.  Currently dehydrated, gentle IV fluids and monitor.  14.  Aortic stenosis.  Supportive care for now thereafter outpatient cardiology follow-up with Dr. Debara Pickett post discharge.  15.  Hypertension.  Placed on Norvasc along with as needed IV hydralazine.   16.  Early dementia.  Hold Aricept due to bradycardia, at risk for delirium, patient and husband both want that patient can get delirious while he is in the hospital, use Haldol if needed, minimize narcotics and benzodiazepines  17. DM type II.  Check A1c, for now [place] along with sliding scale to continue.    DVT Prophylaxis Eliquis  AM Labs Ordered, also please review Full Orders  Family Communication: Admission, patients condition and plan of care including tests being ordered have been discussed with the patient and wife who indicate understanding and agree with the plan and Code Status.  Code Status Full  Likely DC to  TBD  Condition GUARDED    Consults called: Cards    Admission status: Obs    Time spent in minutes : 35   Lala Lund M.D on 02/26/2021 at 3:13 PM  To page go to [email or web address] - password [facility]

## 2021-02-26 NOTE — Consult Note (Addendum)
Cardiology Consultation:   Patient ID: Randall Wilkins MRN: [id number]; DOB: Feb 19, 1941  Admit date: 02/26/2021 Date of Consult: 02/26/2021  PCP:  [name], MD   Index  Cardiologist:  Pixie Casino, MD  Advanced Practice Provider:  No care team member to display Electrophysiologist:  None  [id number]  Patient Profile:   Randall Wilkins is a 80 y.o. 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 Candiss Norse.  History of Present Illness:   [name] is an 80 yo male with PMH noted above. Had a large DES placed to the RCA in 2005 with previously placed stent to diag noted to be patent at that time. Suffered a stroke in 2013. Underwent CABG x2 in 2016 with LIMA-LAD and SVG-OM1. Diagnosed with Afib 02/2020 and started on Eliquis 2.88m BID.  In May 2021 he was diagnosed with pneumonia which had difficulty resolving.  He had imaging which suggested multifocal infiltrates and his PCP referred him to pulmonology.  He was seen by Dr. BLamonte Sakaiand had a CT chest which showed a large 5 cm cavitary lung mass in the right lower lobe as well as some persistent lung mass in the upper left lobe.  He was ultimately found to have stage IV non-small cell lung cancer.  He has been following with Dr. MEarlie Serverwith the cancer center.  He has been on oral chemotherapy.  He uses 2 L [place] mostly as needed but recently had had to increase use to routinely.  He was last seen in the office by Dr. HDebara Picketton 02/06/2021 for worsening dyspnea.  Notes indicate he was recently started on Spiriva which had improved his dyspnea a little bit.  Also reported increasing lower extremity edema.  EKG done in the office that day appeared to be long  first-degree AV block with Mobitz type I.  There was concern with possible heart failure.  Plan was to get an outpatient echocardiogram to reassess his LV function.   In talking with him and his wife he has PT and OT evaluations on a weekly basis.  Patient reports he was in his usual state of health up until this morning.  He is limited in his mobility with his chronic dyspnea and lower extremity weakness.  PT came to evaluate him today and checked his heart rate noting it in the 20s.  On recheck noted it was only in the 322s  With his complaints of weakness EMS was called.  He denies any chest pain, or worsening shortness of breath.  Did state that he felt lightheaded.  In the ED his labs showed stable electrolytes, creatinine 1.46, magnesium 1.1, WBC 5.6, hemoglobin 11.8, TSH 1.84.  EKG appears to be sinus bradycardia with first-degree AV block with frequent PVCs, nonspecific ST/T wave abnormalities.  Chest x-ray with opacity in the right lung base and left mid chest similar to prior study.  Difficult to exclude superimposed pneumonia.  Of note he has historically been on Toprol 25 mg daily.  Wife reports he last took this this morning around 8 AM.   Past Medical History:  Diagnosis Date   Aortic stenosis, mild 11/14/2013   Aortic valve sclerosis 03/29/2015   Bilateral leg edema 05/21/2014  CAD (coronary artery disease)    Chronic diastolic congestive heart failure (HCC)    Chronic kidney disease (CKD), stage III (moderate) (HCC)    Diabetes 1.5, managed as type 1 ([place]) 02/04/2013   Diabetic peripheral neuropathy associated with type 1 diabetes mellitus ([place]) 11/14/2013   Dysphagia    Dyspnea on exertion 03/21/2015   Exogenous obesity    Gout    Heart attack ([place])    History of nuclear stress test 07/2011   dipyridamole; fixed inferolateral defect, worse at stress than rest; no reversible ischemia; low risk scan    Hyperlipidemia    Hypertension    Hypothyroidism    Insulin  dependent diabetes mellitus    Left foot drop    Left main coronary artery disease 03/28/2015   lung ca dx'd 08/2020   Memory loss    Obesity (BMI 30-39.9) 11/14/2013   Obstructive sleep apnea 03/21/2015   OSA on CPAP    uses a cpap   Peptic ulcer with hemorrhage 03/28/2015   Peripheral neuropathy    Pneumonia    Rhabdomyolysis    S/P CABG x 2 03/29/2015   LIMA to Diagonal, SVG to OM, EVH via right thigh   Stroke ([place])    L patietal with small scattered lacunar infarcts   Thrombocytopenia ([place]) 03/21/2015   Venous insufficiency    Weakness generalized 03/21/2015    Past Surgical History:  Procedure Laterality Date   BACK SURGERY  2002   lumbosacral. 11 back surgeries total   BRONCHIAL BIOPSY  08/06/2020   Procedure: BRONCHIAL BIOPSIES;  Surgeon: Collene Gobble, MD;  Location: [facility];  Service: Pulmonary;;   BRONCHIAL BRUSHINGS  08/06/2020   Procedure: BRONCHIAL BRUSHINGS;  Surgeon: Collene Gobble, MD;  Location: [facility];  Service: Pulmonary;;   BRONCHIAL NEEDLE ASPIRATION BIOPSY  08/06/2020   Procedure: BRONCHIAL NEEDLE ASPIRATION BIOPSIES;  Surgeon: Collene Gobble, MD;  Location: [facility];  Service: Pulmonary;;   Carotid Doppler  03/2013   bilat bulb/prox ICAs - mild amount of fibrous plaque with no evidence of diameter reduction   CARPAL TUNNEL RELEASE Bilateral 08/09/2014   Procedure: BILATERAL CARPAL TUNNEL RELEASE;  Surgeon: Daryll Brod, MD;  Location: [facility];  Service: Orthopedics;  Laterality: Bilateral;  ANESTHESIA:  IV REGIONAL BIL FAB   CHOLECYSTECTOMY     COLONOSCOPY     CORONARY ANGIOPLASTY  10/13/1996   CORONARY ANGIOPLASTY  09/21/1989   emergency PTCA   CORONARY ANGIOPLASTY  10/13/1996   Multi-Link diagonal & OD stenting (Dr. Marella Chimes)   [place]  12/03/1997   disease of mid DX-1 ~50% & in mid PLA & PDA (distal lesions) (Dr. Marella Chimes)    CORONARY ANGIOPLASTY  10/14/1999   progression of  disease distal PLA & PDA; progression of disease prox RCA - moderate (Dr. Marella Chimes)    CORONARY ANGIOPLASTY WITH STENT PLACEMENT  04/04/2004   4.0x66m non-DES (thrombectomy via AngioJet) to RCA for high grade stenosis (Dr. RMarella Chimes   CORONARY ARTERY BYPASS GRAFT N/A 03/29/2015   Procedure: CORONARY ARTERY BYPASS GRAFTING TIMES TWO USING LEFT INTERNAL MAMMARY ARTERY AND RIGHT LEG GREATER SAPHENOUS VEIN HARVESTED ENDOSCOPICALLY.;  Surgeon: CRexene Alberts MD;  [place] [facility]  Service: Open Heart Surgery;  Laterality: N/A;   ESOPHAGOGASTRODUODENOSCOPY N/A 03/27/2015   Procedure: ESOPHAGOGASTRODUODENOSCOPY (EGD);  Surgeon: MClarene Essex MD;  [facility];  Service: Endoscopy;  Laterality: N/A;  possible dilation   ESOPHAGOGASTRODUODENOSCOPY N/A 10/05/2020   Procedure: ESOPHAGOGASTRODUODENOSCOPY (EGD);  Surgeon: Arta Silence, MD;  Location: [facility];  Service: Endoscopy;  Laterality: N/A;   FINE NEEDLE ASPIRATION  08/06/2020   Procedure: FINE NEEDLE ASPIRATION (FNA) LINEAR;  Surgeon: Collene Gobble, MD;  Location: [facility]OSCOPY;  Service: Pulmonary;;   IR IMAGING GUIDED PORT INSERTION  10/07/2020   LEFT HEART CATHETERIZATION WITH CORONARY ANGIOGRAM N/A 03/28/2015   Procedure: LEFT HEART CATHETERIZATION WITH CORONARY ANGIOGRAM;  Surgeon: Peter M Martinique, MD;  Location: [facility] CATH LAB;  Service: Cardiovascular;  Laterality: N/A;   SINUS ENDO W/FUSION     TONSILLECTOMY     TRANSTHORACIC ECHOCARDIOGRAM  08/08/2013   EF 55-60%, mild conc hypertrophy, grade 1 diastolic dysfunction; AV with mild stenosis; LA & RA mildly dilated   VIDEO BRONCHOSCOPY WITH ENDOBRONCHIAL NAVIGATION N/A 08/06/2020   Procedure: VIDEO BRONCHOSCOPY WITH ENDOBRONCHIAL NAVIGATION;  Surgeon: Collene Gobble, MD;  Location: [place] [facility];  Service: Pulmonary;  Laterality: N/A;   VIDEO BRONCHOSCOPY WITH ENDOBRONCHIAL ULTRASOUND N/A 08/06/2020   Procedure: VIDEO BRONCHOSCOPY WITH ENDOBRONCHIAL ULTRASOUND;   Surgeon: Collene Gobble, MD;  Location[facility] ENDOSCOPY;  Service: Pulmonary;  Laterality: N/A;     Home Medications:  Prior to Admission medications   Medication Sig Start Date End Date Taking? Authorizing Provider  albuterol (VENTOLIN HFA) 108 (90 Base) MCG/ACT inhaler Inhale 2 puffs into the lungs every 6 (six) hours as needed for wheezing or shortness of breath. 09/04/20  Yes Collene Gobble, MD  allopurinol (ZYLOPRIM) 100 MG tablet Take 1 tablet (100 mg total) by mouth daily. 10/18/20  Yes Hilty, Nadean Corwin, MD  amitriptyline (ELAVIL) 75 MG tablet Take 75 mg by mouth at bedtime. 12/10/20  Yes [provider]  apixaban (ELIQUIS) 2.5 MG TABS tablet Take 1 tablet (2.5 mg total) by mouth 2 (two) times daily. 10/18/20  Yes Hilty, Nadean Corwin, MD  donepezil (ARICEPT) 10 MG tablet Take 1 tablet (10 mg total) by mouth daily. Patient taking differently: Take 10 mg by mouth at bedtime. 12/23/20  Yes Marcial Pacas, MD  furosemide (LASIX) 40 MG tablet Take 1 tablet (40 mg total) by mouth 2 (two) times daily. Patient taking differently: Take 40 mg by mouth See admin instructions. 54m in the morning and at 1pm 02/21/21  Yes Hilty, KNadean Corwin MD  insulin lispro (HUMALOG) 100 UNIT/ML cartridge Inject 5-15 Units into the skin 3 (three) times daily with meals. Sliding scale.   Yes [provider]  levothyroxine (SYNTHROID, LEVOTHROID) 75 MCG tablet Take 75 mcg by mouth daily before breakfast.  05/14/17  Yes [provider]  lidocaine (XYLOCAINE) 2 % solution Use as directed 15 mLs in the mouth or throat as needed for mouth pain. 09/19/20  Yes GCarmon SailsJ, PA-C  lidocaine-prilocaine (EMLA) cream Apply 1 application topically as needed. Apply a teaspoon over port site at least 1 hour prior to lab appt. Do not rub in and cover with plastic wrap. 09/30/20  Yes MCurt Bears MD  lisinopril (ZESTRIL) 10 MG tablet TAKE ONE TABLET DAILY Patient taking differently: Take 10 mg by mouth daily.  08/12/20  Yes KElayne Snare MD  metFORMIN (GLUCOPHAGE-XR) 750 MG 24 hr tablet TAKE 1 TABLET WITH DINNER 11/11/20  Yes KElayne Snare MD  metoprolol succinate (TOPROL-XL) 25 MG 24 hr tablet Take 1 tablet (25 mg total) by mouth daily. 03/13/20  Yes Hilty, KNadean Corwin MD  ondansetron (ZOFRAN) 4 MG tablet Take 1 tablet (4 mg total) by mouth every 6 (six) hours as needed for nausea. 10/08/20  Yes  Sheikh, Omair Latif, DO  potassium chloride SA (K-DUR,KLOR-CON) 20 MEQ tablet Take 20 mEq by mouth 2 (two) times daily.    Yes [provider]  prednisoLONE acetate (PRED FORTE) 1 % ophthalmic suspension Place 1 drop into the right eye 4 (four) times daily. 02/05/21  Yes Rankin, Clent Demark, MD  prochlorperazine (COMPAZINE) 10 MG tablet Take 1 tablet (10 mg total) by mouth every 6 (six) hours as needed for nausea or vomiting. 09/02/20  Yes Curt Bears, MD  rosuvastatin (CRESTOR) 20 MG tablet Take 20 mg by mouth daily.   Yes [provider]  sotorasib (LUMAKRAS) 120 MG TABS Take 960 mg by mouth every morning. 01/13/21  Yes Curt Bears, MD  Tamsulosin HCl (FLOMAX) 0.4 MG CAPS Take 0.4 mg by mouth at bedtime.   Yes [provider]  Tiotropium Bromide Monohydrate (SPIRIVA RESPIMAT) 2.5 MCG/ACT AERS Inhale 2 puffs into the lungs daily. 02/06/21  Yes Collene Gobble, MD  vitamin B-12 (CYANOCOBALAMIN) 1000 MCG tablet Take 1,000 mcg by mouth daily.   Yes [provider]  Blood Glucose Monitoring Suppl (CONTOUR NEXT MONITOR) w/Device KIT 1 Device by Does not apply route 3 (three) times daily. Use to check blood sugars 3 times daily. Dx Code E13.9 07/29/17   Elayne Snare, MD  CONTOUR NEXT TEST test strip USE 1 STRIP TO CHECK GLUCOSE 4 TIMES DAILY 10/13/20   Elayne Snare, MD  insulin degludec (TRESIBA FLEXTOUCH) 200 UNIT/ML FlexTouch Pen Inject 24 Units into the skin at bedtime. Patient taking differently: Inject 16 Units into the skin at bedtime. 10/08/20   Sheikh, Omair Latif, DO  Insulin Pen  Needle (EASY COMFORT PEN NEEDLES) 33G X 4 MM MISC 1 each by Does not apply route See admin instructions. Use to inject insulin 5 times daily. 05/31/19   Elayne Snare, MD  OXYGEN Inhale 2 L into the lungs. At bedtime and during the day prn    [provider]    Inpatient Medications: Scheduled Meds:  [START ON 02/27/2021] allopurinol  100 mg Oral Daily   amLODipine  10 mg Oral Daily   apixaban  2.5 mg Oral BID   insulin aspart  0-5 Units Subcutaneous QHS   insulin aspart  0-9 Units Subcutaneous TID WC   insulin degludec  20 Units Subcutaneous QHS   [START ON 02/27/2021] levothyroxine  75 mcg Oral QAC breakfast   sotorasib  960 mg Oral q morning   tamsulosin  0.4 mg Oral Daily   Tiotropium Bromide Monohydrate  2 puff Inhalation Daily   [START ON 02/27/2021] vitamin B-12  1,000 mcg Oral Daily   Continuous Infusions:  lactated ringers 75 mL/hr at 02/26/21 1602   magnesium sulfate bolus IVPB     magnesium sulfate bolus IVPB 2 g (02/26/21 1604)   PRN Meds: acetaminophen **OR** acetaminophen, albuterol, hydrALAZINE, ondansetron **OR** ondansetron (ZOFRAN) IV  Allergies:    Allergies  Allergen Reactions   Actos [Pioglitazone] Swelling   Metformin And Related Nausea Only    3.16.2022 pt is currently taking this medication at home   Niaspan [Niacin Er] Itching and Rash   Wound Dressing Adhesive Rash    Social History:   Social History   Socioeconomic History   Marital status: Married    Spouse name: Not on file   Number of children: 3   Years of education: 12   Highest education level: Not on file  Occupational History   Not on file  Tobacco Use   Smoking status:  Former Smoker    Packs/day: 2.00    Years: 20.00    Pack years: 40.00    Types: Cigarettes    Quit date: 01/25/1974    Years since quitting: 47.1   Smokeless tobacco: Never Used  Vaping Use   Vaping Use: Never used  Substance and Sexual Activity   Alcohol use: No     Alcohol/week: 0.0 standard drinks   Drug use: No   Sexual activity: Not on file  Other Topics Concern   Not on file  Social History Narrative   Lives with wife and son   Right handed    Caffeine: maybe 1 cup/day   Social Determinants of Health   Financial Resource Strain: Not on file  Food Insecurity: Not on file  Transportation Needs: Not on file  Physical Activity: Not on file  Stress: Not on file  Social Connections: Not on file  Intimate Partner Violence: Not on file    Family History:    Family History  Problem Relation Age of Onset   Heart disease Mother    Coronary artery disease Father    Cancer Maternal Grandmother    Heart Problems Maternal Grandfather    Diabetes Son        borderline      ROS:  Please see the history of present illness.   All other ROS reviewed and negative.     Physical Exam/Data:   Vitals:   02/26/21 1400 02/26/21 1415 02/26/21 1430 02/26/21 1445  BP: (!) 147/89 (!) 153/56 (!) 154/78 (!) 160/51  Pulse: (!) 56 (!) 38 63 68  Resp: (!) 23 (!) 24 10 (!) 24  Temp:      TempSrc:      SpO2: 100% 99% 98% 99%  Weight:      Height:       No intake or output data in the 24 hours ending 02/26/21 1607 Last 3 Weights 02/26/2021 02/18/2021 02/06/2021  Weight (lbs) 222 lb 226 lb 8 oz 232 lb  Weight (kg) 100.699 kg 102.74 kg 105.235 kg     Body mass index is 34.77 kg/m.  General:  Well nourished, well developed, in no acute distress.  Wearing nasal cannula at 2 L HEENT: normal Lymph: no adenopathy Neck: no JVD Endocrine:  No thryomegaly Vascular: No carotid bruits Cardiac:  normal S1, S2; bradycardia; 2/6 systolic murmur  Lungs:  clear to auscultation bilaterally, no wheezing, rhonchi or rales  Abd: soft, nontender Ext: 1+ bilateral lower extremity edema Musculoskeletal:  No deformities, BUE and BLE strength normal and equal Skin: warm and dry  Neuro:  CNs 2-12 intact, no focal abnormalities noted Psych:  Normal affect   EKG:   The EKG was personally reviewed and demonstrates: Appears sinus bradycardia with first-degree AV block with frequent PVCs Telemetry:  Telemetry was personally reviewed and demonstrates: Sinus bradycardia with first-degree AV block, PVCs, sinus rhythm, sinus bradycardia with Mobitz type I.  No identified high degree AV block  Relevant CV Studies:  Echo: 03/2020  IMPRESSIONS    1. Left ventricular ejection fraction, by estimation, is 45 to 50%. Left  ventricular ejection fraction by 3D volume is 48 %. The left ventricle has  mildly decreased function. The left ventricle demonstrates global  hypokinesis. There is mild left  ventricular hypertrophy. Left ventricular diastolic parameters are  indeterminate. The average left ventricular global longitudinal strain is  -14.0 %.  2. Right ventricular systolic function is mildly reduced. The right  ventricular size is mildly  enlarged. There is mildly elevated pulmonary  artery systolic pressure. The estimated right ventricular systolic  pressure is 54.0 mmHg.  3. The mitral valve is normal in structure. No evidence of mitral valve  regurgitation.  4. The aortic valve is abnormal. Moderately calcified. Aortic valve  regurgitation is trivial. Mild to moderate aortic valve stenosis. Vmax 2.8  m/s, MG 17 mmHg, AVA 1.1 m/s, DI 0.35  5. The inferior vena cava is normal in size with greater than 50%  respiratory variability, suggesting right atrial pressure of 3 mmHg.   Comparison(s): 04/01/17 EF 55-60%. Mild AS 75mHg mean, 283mg peak.   Laboratory Data:  High Sensitivity Troponin:  No results for input(s): TROPONINIHS in the last 720 hours.   Chemistry Recent Labs  Lab 02/26/21 1221  NA 140  K 3.8  CL 103  CO2 29  GLUCOSE 114*  BUN 22  CREATININE 1.46*  CALCIUM 8.9  GFRNONAA 48*  ANIONGAP 8    No results for input(s): PROT, ALBUMIN, AST, ALT, ALKPHOS, BILITOT in the last 168 hours. Hematology Recent Labs  Lab 02/26/21 1221   WBC 5.6  RBC 4.20*  HGB 11.8*  HCT 39.5  MCV 94.0  MCH 28.1  MCHC 29.9*  RDW 15.3  PLT 131*   BNPNo results for input(s): BNP, PROBNP in the last 168 hours.  DDimer No results for input(s): DDIMER in the last 168 hours.   Radiology/Studies:  DG Chest 2 View  Result Date: 02/26/2021 CLINICAL DATA:  Weakness on home oxygen. EXAM: CHEST - 2 VIEW COMPARISON:  November 05, 2020 FINDINGS: RIGHT IJ Port-A-Cath in situ. Post median sternotomy for CABG. Heart size remains enlarged. Opacity at the RIGHT lung base and LEFT mid chest as on the prior study. Potentially diminished effusion in the RIGHT chest compared to previous imaging. On limited assessment no acute skeletal process. IMPRESSION: 1. Opacity at the RIGHT lung base and LEFT mid chest, similar to the prior study. Patient with bilateral lung masses. No definite acute process. Difficult to exclude superimposed pneumonia given pattern of disease. 2. Potentially diminished RIGHT pleural effusion. Electronically Signed   By: GeZetta Bills.D.   On: 02/26/2021 13:28     Assessment and Plan:   [place] S JoMartiniques a [age].o. 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 SiCandiss Norse 1. Bradycardia: Baseline bradycardia based on prior EKGs.  Has been on metoprolol 25 mg daily with last dose this morning around 8 AM.  Also noted to have a magnesium level of 1.1.  Review of telemetry shows intermittent episodes of sinus bradycardia with likely Mobitz type I.  He is well compensated, and blood pressure is stable. --TSH WNL --Beta-blocker has been held appropriately -->  Monitor on telemetry with beta-blocker washout --Supplement electrolytes   -- also having frequent PVCs - off & on in  Ventricular Bigeminy (this is also in setting of Mobitz 1)  2. Paroxysmal Afib: As above EKG telemetry shows sinus rhythm with episodes of Mobitz type I. --CHA2DS2-VASc score of at least 6 --On Eliquis 2.5 mg twice daily  3.  CAD status post two-vessel CABG: No anginal symptoms  4.  Stage IV non-small cell lung cancer: Followed by Dr. MoEarlie Servert the cancer center  5.  Aortic stenosis: Classified as mild to moderate on echocardiogram from 4/21.  Notable murmur on exam --Repeat echocardiogram  6.  Chronic combined CHF: EF of 50% on echocardiogram from 4/21.  Does have lower extremity edema present on exam.  States this is no worse than it has been recently. --Check echo as above  7.  Hypomagnesia: Mag 1.1 on admission. --IV supplement  8.  Diabetes: SSI For questions or updates, please contact [place] Please consult [email or web address] for contact info under    Signed, Reino Bellis, NP  02/26/2021 4:07 PM    ATTENDING ATTESTATION  I have seen, examined and evaluated the patient this PM in ER  along with Reino Bellis, NP-C.  After reviewing all the available data and chart, we discussed the patients laboratory, study & physical findings as well as symptoms in detail. I agree with her findings, examination as well as impression recommendations as per our discussion.    Attending adjustments noted in italics.   Instrument situation where gentleman with significant lung cancer, CAD and A. fib etc. is here having been sent due to bradycardia noted by physical therapy.  His heart rate ranges relatively wide, currently seems to be relatively stable in the 70s and 80s.  Does have some PVCs.  The symptoms portion of his evaluation is that he has had progressive dyspnea and edema.  He has been taking extra furosemide that predated this hospitalization and I question if he could be potentially somewhat intravascularly dry with hypomagnesemia with borderline calcium and potassium  levels.  I do agree with supplementing magnesium. The main therapy right now is to await washout of beta-blocker to allow for the heart rate to respond.  We then need to monitor and see if he reverts into atrial fibrillation or not.  We also need to determine how well he would do back: Diuretic as far as his electrolytes to avoid further recurrence of this phenomenon.  Agree with waiting to have beta-blocker washout.  Monitor renal function. Echo ordered-we can cancel the upcoming outpatient echo for next week.  We will follow up in the morning.    Glenetta Hew, M.D., M.S. Interventional Cardiologist   Pager # [phone] Phone # [phone] [address] [place], [place] [address]

## 2021-02-26 NOTE — ED Provider Notes (Signed)
[facility] EMERGENCY DEPARTMENT Provider Note   CSN: [id number] Arrival date & time: 02/26/21  1209     History Chief Complaint  Patient presents with  . Fatigue  . Bradycardia    Randall Wilkins is a 80 y.o. male.  Patient with history of stage IV lung cancer on home chemotherapy oral dose, hypothyroid, UTI, diabetes, stroke, high blood pressure, heart failure, coronary artery disease, aortic valve sclerosis presents with general weakness, fatigue and low heart rate.  Heart rate in the 20s to 40s for physical therapy and multiple rechecks.  Patient does not have a pacemaker.  Patient did take his metoprolol this morning.  No new medications or changes recently.  No fevers or chills.  No chest pain or new shortness of breath.  Patient on chronic 2 L nasal cannula.  Patient follows with local cardiologist.  Difficulty getting details from patient due to hard of hearing.        Past Medical History:  Diagnosis Date  . Aortic stenosis, mild 11/14/2013  . Aortic valve sclerosis 03/29/2015  . Bilateral leg edema 05/21/2014  . CAD (coronary artery disease)   . Chronic diastolic congestive heart failure ([place])   . Chronic kidney disease (CKD), stage III (moderate) (HCC)   . Diabetes 1.5, managed as type 1 ([place]) 02/04/2013  . Diabetic peripheral neuropathy associated with type 1 diabetes mellitus ([place]) 11/14/2013  . Dysphagia   . Dyspnea on exertion 03/21/2015  . Exogenous obesity   . Gout   . Heart attack ([place])   . History of nuclear stress test 07/2011   dipyridamole; fixed inferolateral defect, worse at stress than rest; no reversible ischemia; low risk scan   . Hyperlipidemia   . Hypertension   . Hypothyroidism   . Insulin dependent diabetes mellitus   . Left foot drop   . Left main coronary artery disease 03/28/2015  . lung ca dx'd 08/2020  . Memory loss   . Obesity (BMI 30-39.9) 11/14/2013  . Obstructive sleep apnea 03/21/2015  . OSA on CPAP    uses a cpap  .  Peptic ulcer with hemorrhage 03/28/2015  . Peripheral neuropathy   . Pneumonia   . Rhabdomyolysis   . 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 Stroke ([place])    L patietal with small scattered lacunar infarcts  . Thrombocytopenia ([place]) 03/21/2015  . Venous insufficiency   . Weakness generalized 03/21/2015    Patient Active Problem List   Diagnosis Date Noted  . Port-A-Cath in place 02/03/2021  . Macular pucker, right eye 01/29/2021  . Macular pucker, left eye 01/29/2021  . Memory loss 12/23/2020  . Cerebrovascular accident (CVA) ([employer]) 12/23/2020  . ARF (acute renal failure) ([place]) 10/02/2020  . Adenocarcinoma of right lung, stage 4 ([place]) 09/02/2020  . Encounter for antineoplastic immunotherapy 09/02/2020  . Goals of care, counseling/discussion 08/20/2020  . Encounter for antineoplastic chemotherapy 08/20/2020  . Mediastinal lymphadenopathy 08/06/2020  . Lung mass 06/05/2020  . Essential hypertension 03/23/2018  . Mild cognitive impairment 12/24/2017  . Gait abnormality 12/24/2017  . Murmur, cardiac 03/04/2017  . History of stroke 12/12/2015  . Gait disorder 12/12/2015  . Coronary artery disease involving native coronary artery of native heart without angina pectoris   . S/P CABG x 2 03/29/2015  . Aortic valve sclerosis 03/29/2015  . Peptic ulcer with hemorrhage 03/28/2015  . Left main coronary artery disease 03/28/2015  . Chronic  diastolic congestive heart failure ([place])   . Chronic kidney disease (CKD), stage III (moderate) (HCC)   . Dysphagia   . AKI (acute kidney injury) ([place])   . Acute respiratory failure with hypoxia ([place]) 03/25/2015  . NSTEMI (non-ST elevated myocardial infarction) ([place]) 03/25/2015  . Shortness of breath   . UTI (lower urinary tract infection)   . Subendocardial MI subsequent episode care ([place]) 03/24/2015  . SOB (shortness of breath)   . Confusion 03/21/2015  . Weakness generalized 03/21/2015  . Increased urinary  frequency 03/21/2015  . Hyperglycemia 03/21/2015  . Thrombocytopenia ([place]) 03/21/2015  . UTI (urinary tract infection) 03/21/2015  . Fall 03/21/2015  . Hematoma of abdominal wall 03/21/2015  . Diarrhea 03/21/2015  . Obstructive sleep apnea 03/21/2015  . Acute renal failure ([place]) 03/21/2015  . Mild diastolic dysfunction 09/73/5329  . Dyspnea on exertion 03/21/2015  . Neuropathy 09/11/2014  . Bilateral leg edema 05/21/2014  . Diabetic peripheral neuropathy associated with type 1 diabetes mellitus ([place]) 11/14/2013  . Type 1 diabetes mellitus with nephropathy ([place]) 11/14/2013  . Aortic valve stenosis 11/14/2013  . Obesity (BMI 30-39.9) 11/14/2013  . Asymptomatic PVCs 11/14/2013  . Memory impairment 02/04/2013  . Hyperlipidemia 02/04/2013  . Diabetes 1.5, managed as type 1 ([place]) 02/04/2013  . Cerebral infarction ([place]) 02/04/2013  . CAD S/P percutaneous coronary angioplasty 11/14/2004    Past Surgical History:  Procedure Laterality Date  . BACK SURGERY  2002   lumbosacral. 11 back surgeries total  . BRONCHIAL BIOPSY  08/06/2020   Procedure: BRONCHIAL BIOPSIES;  Surgeon: Collene Gobble, MD;  Location: [facility] ENDOSCOPY;  Service: Pulmonary;;  . BRONCHIAL BRUSHINGS  08/06/2020   Procedure: BRONCHIAL BRUSHINGS;  Surgeon: Collene Gobble, MD;  Location: [facility] ENDOSCOPY;  Service: Pulmonary;;  . BRONCHIAL NEEDLE ASPIRATION BIOPSY  08/06/2020   Procedure: BRONCHIAL NEEDLE ASPIRATION BIOPSIES;  Surgeon: Collene Gobble, MD;  Location: [facility] ENDOSCOPY;  Service: Pulmonary;;  . Carotid Doppler  03/2013   bilat bulb/prox ICAs - mild amount of fibrous plaque with no evidence of diameter reduction  . CARPAL TUNNEL RELEASE Bilateral 08/09/2014   Procedure: BILATERAL CARPAL TUNNEL RELEASE;  Surgeon: Daryll Brod, MD;  Location: [place];  Service: Orthopedics;  Laterality: Bilateral;  ANESTHESIA:  IV REGIONAL BIL FAB  . CHOLECYSTECTOMY    . COLONOSCOPY    . CORONARY ANGIOPLASTY  10/13/1996  .  CORONARY ANGIOPLASTY  09/21/1989   emergency PTCA  . CORONARY ANGIOPLASTY  10/13/1996   Multi-Link diagonal & OD stenting (Dr. Marella Chimes)  . CORONARY ANGIOPLASTY  12/03/1997   disease of mid DX-1 ~50% & in mid PLA & PDA (distal lesions) (Dr. Marella Chimes)   . CORONARY ANGIOPLASTY  10/14/1999   progression of disease distal PLA & PDA; progression of disease prox RCA - moderate (Dr. Marella Chimes)   . CORONARY ANGIOPLASTY WITH STENT PLACEMENT  04/04/2004   4.0x53m non-DES (thrombectomy via AngioJet) to RCA for high grade stenosis (Dr. RMarella Chimes  . CORONARY ARTERY BYPASS GRAFT N/A 03/29/2015   Procedure: CORONARY ARTERY BYPASS GRAFTING TIMES TWO USING LEFT INTERNAL MAMMARY ARTERY AND RIGHT LEG GREATER SAPHENOUS VEIN HARVESTED ENDOSCOPICALLY.;  Surgeon: CRexene Alberts MD;  [place]  Service: Open Heart Surgery;  Laterality: N/A;  . ESOPHAGOGASTRODUODENOSCOPY N/A 03/27/2015   Procedure: ESOPHAGOGASTRODUODENOSCOPY (EGD);  Surgeon: MClarene Essex MD;  [facility];  Service: Endoscopy;  Laterality: N/A;  possible dilation  . ESOPHAGOGASTRODUODENOSCOPY N/A 10/05/2020   Procedure: ESOPHAGOGASTRODUODENOSCOPY (EGD);  Surgeon: OPaulita Fujita  Gwyndolyn Saxon, MD;  Location: [facility];  Service: Endoscopy;  Laterality: N/A;  . FINE NEEDLE ASPIRATION  08/06/2020   Procedure: FINE NEEDLE ASPIRATION (FNA) LINEAR;  Surgeon: Collene Gobble, MD;  Location: [place] ENDOSCOPY;  Service: Pulmonary;;  . IR IMAGING GUIDED PORT INSERTION  10/07/2020  . LEFT HEART CATHETERIZATION WITH CORONARY ANGIOGRAM N/A 03/28/2015   Procedure: LEFT HEART CATHETERIZATION WITH CORONARY ANGIOGRAM;  Surgeon: Peter M Martinique, MD;  Location: [facility] CATH LAB;  Service: Cardiovascular;  Laterality: N/A;  . SINUS ENDO W/FUSION    . TONSILLECTOMY    . TRANSTHORACIC ECHOCARDIOGRAM  08/08/2013   EF 55-60%, mild conc hypertrophy, grade 1 diastolic dysfunction; AV with mild stenosis; LA & RA mildly dilated  . VIDEO BRONCHOSCOPY WITH ENDOBRONCHIAL  NAVIGATION N/A 08/06/2020   Procedure: VIDEO BRONCHOSCOPY WITH ENDOBRONCHIAL NAVIGATION;  Surgeon: Collene Gobble, MD;  Location: [place] ENDOSCOPY;  Service: Pulmonary;  Laterality: N/A;  . VIDEO BRONCHOSCOPY WITH ENDOBRONCHIAL ULTRASOUND N/A 08/06/2020   Procedure: VIDEO BRONCHOSCOPY WITH ENDOBRONCHIAL ULTRASOUND;  Surgeon: Collene Gobble, MD;  Location: [facility] ENDOSCOPY;  Service: Pulmonary;  Laterality: N/A;       Family History  Problem Relation Age of Onset  . Heart disease Mother   . Coronary artery disease Father   . Cancer Maternal Grandmother   . Heart Problems Maternal Grandfather   . Diabetes Son        borderline     Social History   Tobacco Use  . Smoking status: Former Smoker    Packs/day: 2.00    Years: 20.00    Pack years: 40.00    Types: Cigarettes    Quit date: 01/25/1974    Years since quitting: 47.1  . Smokeless tobacco: Never Used  Vaping Use  . Vaping Use: Never used  Substance Use Topics  . Alcohol use: No    Alcohol/week: 0.0 standard drinks  . Drug use: No    Home Medications Prior to Admission medications   Medication Sig Start Date End Date Taking? Authorizing Provider  albuterol (VENTOLIN HFA) 108 (90 Base) MCG/ACT inhaler Inhale 2 puffs into the lungs every 6 (six) hours as needed for wheezing or shortness of breath. 09/04/20   Collene Gobble, MD  allopurinol (ZYLOPRIM) 100 MG tablet Take 1 tablet (100 mg total) by mouth daily. 10/18/20   Hilty, Nadean Corwin, MD  amitriptyline (ELAVIL) 75 MG tablet Take 75 mg by mouth at bedtime. 12/10/20   [provider]  apixaban (ELIQUIS) 2.5 MG TABS tablet Take 1 tablet (2.5 mg total) by mouth 2 (two) times daily. 10/18/20   Hilty, Nadean Corwin, MD  Blood Glucose Monitoring Suppl (CONTOUR NEXT MONITOR) w/Device KIT 1 Device by Does not apply route 3 (three) times daily. Use to check blood sugars 3 times daily. Dx Code E13.9 07/29/17   Elayne Snare, MD  CONTOUR NEXT TEST test strip USE 1 STRIP TO CHECK GLUCOSE 4  TIMES DAILY 10/13/20   Elayne Snare, MD  donepezil (ARICEPT) 10 MG tablet Take 1 tablet (10 mg total) by mouth daily. 12/23/20   Marcial Pacas, MD  furosemide (LASIX) 40 MG tablet Take 1 tablet (40 mg total) by mouth 2 (two) times daily. 02/21/21   Hilty, Nadean Corwin, MD  insulin degludec (TRESIBA FLEXTOUCH) 200 UNIT/ML FlexTouch Pen Inject 24 Units into the skin at bedtime. Patient taking differently: Inject 20 Units into the skin at bedtime. 24 units on Chemo days 10/08/20   Raiford Noble Latif, DO  insulin lispro (HUMALOG) 100  UNIT/ML cartridge Inject 5-15 Units into the skin 3 (three) times daily with meals. Sliding scale.    [provider]  Insulin Pen Needle (EASY COMFORT PEN NEEDLES) 33G X 4 MM MISC 1 each by Does not apply route See admin instructions. Use to inject insulin 5 times daily. 05/31/19   Elayne Snare, MD  levothyroxine (SYNTHROID, LEVOTHROID) 75 MCG tablet Take 75 mcg by mouth daily before breakfast.  05/14/17   [provider]  lidocaine (XYLOCAINE) 2 % injection See admin instructions.    [provider]  lidocaine (XYLOCAINE) 2 % solution Use as directed 15 mLs in the mouth or throat as needed for mouth pain. 09/19/20   Kinnie Feil, PA-C  lidocaine-prilocaine (EMLA) cream Apply 1 application topically as needed. Apply a teaspoon over port site at least 1 hour prior to lab appt. Do not rub in and cover with plastic wrap. 09/30/20   Curt Bears, MD  lisinopril (ZESTRIL) 10 MG tablet TAKE ONE TABLET DAILY Patient taking differently: Take 10 mg by mouth daily. 08/12/20   Elayne Snare, MD  metFORMIN (GLUCOPHAGE-XR) 750 MG 24 hr tablet TAKE 1 TABLET WITH DINNER 11/11/20   Elayne Snare, MD  metoprolol succinate (TOPROL-XL) 25 MG 24 hr tablet Take 1 tablet (25 mg total) by mouth daily. 03/13/20   Hilty, Nadean Corwin, MD  Nutritional Supplements (ENSURE MAX PROTEIN PO) Take by mouth.    [provider]  ondansetron (ZOFRAN) 4 MG tablet Take 1 tablet (4 mg  total) by mouth every 6 (six) hours as needed for nausea. 10/08/20   Raiford Noble Latif, DO  OXYGEN Inhale 2 L into the lungs. At bedtime and during the day prn    [provider]  potassium chloride SA (K-DUR,KLOR-CON) 20 MEQ tablet Take 20 mEq by mouth 2 (two) times daily.     [provider]  prednisoLONE acetate (PRED FORTE) 1 % ophthalmic suspension Place 1 drop into the right eye 4 (four) times daily. 02/05/21   Rankin, Clent Demark, MD  prochlorperazine (COMPAZINE) 10 MG tablet Take 1 tablet (10 mg total) by mouth every 6 (six) hours as needed for nausea or vomiting. 09/02/20   Curt Bears, MD  rosuvastatin (CRESTOR) 20 MG tablet Take 20 mg by mouth daily.    [provider]  sotorasib (LUMAKRAS) 120 MG TABS Take 960 mg by mouth every morning. 01/13/21   Curt Bears, MD  Tamsulosin HCl (FLOMAX) 0.4 MG CAPS Take 0.4 mg by mouth daily.     [provider]  Tiotropium Bromide Monohydrate (SPIRIVA RESPIMAT) 2.5 MCG/ACT AERS Inhale 2 puffs into the lungs daily. 02/06/21   Collene Gobble, MD  vitamin B-12 (CYANOCOBALAMIN) 1000 MCG tablet Take 1,000 mcg by mouth daily.    [provider]    Allergies    Actos [pioglitazone], Metformin and related, and Niaspan [niacin er]  Review of Systems   Review of Systems  Unable to perform ROS: Other    Physical Exam Updated Vital Signs BP (!) 153/56   Pulse (!) 38   Temp (!) 97.5 F (36.4 C) (Oral)   Resp (!) 24   Ht _0  (1.702 m)   Wt 100.7 kg   SpO2 99%   BMI 34.77 kg/m   Physical Exam Vitals and nursing note reviewed.  Constitutional:      Appearance: He is well-developed.  HENT:     Head: Normocephalic.  Eyes:     General:  Right eye: No discharge.        Left eye: No discharge.     Conjunctiva/sclera: Conjunctivae normal.     Comments: Conjunctiva injection right sclera, mild ecchymosis surrounding (recent procedure)  Neck:     Trachea: No tracheal deviation.   Cardiovascular:     Rate and Rhythm: Regular rhythm. Bradycardia present.     Heart sounds: Murmur (4+SM RUS) heard.    Pulmonary:     Effort: Pulmonary effort is normal.     Breath sounds: Rhonchi present.  Abdominal:     General: There is no distension.     Palpations: Abdomen is soft.     Tenderness: There is no abdominal tenderness. There is no guarding.  Musculoskeletal:        General: Swelling (bilateral LE) present.     Cervical back: Normal range of motion and neck supple.  Skin:    General: Skin is warm.     Capillary Refill: Capillary refill takes 2 to 3 seconds.     Findings: Rash present.  Neurological:     Mental Status: He is alert.     Comments: General weakness on exam all extremities.  Patient will answer focus questions with loud verbal discussion, difficulty with hearing.  Extraocular muscle function intact.  Psychiatric:     Comments: fatigue     ED Results / Procedures / Treatments   Labs (all labs ordered are listed, but only abnormal results are displayed) Labs Reviewed  BASIC METABOLIC PANEL - Abnormal; Notable for the following components:      Result Value   Glucose, Bld 114 (*)    Creatinine, Ser 1.46 (*)    GFR, Estimated 48 (*)    All other components within normal limits  CBC - Abnormal; Notable for the following components:   RBC 4.20 (*)    Hemoglobin 11.8 (*)    MCHC 29.9 (*)    Platelets 131 (*)    All other components within normal limits  CBG MONITORING, ED - Abnormal; Notable for the following components:   Glucose-Capillary 104 (*)    All other components within normal limits  URINALYSIS, ROUTINE W REFLEX MICROSCOPIC  MAGNESIUM  TSH    EKG EKG Interpretation  Date/Time:  Wednesday February 26 2021 12:29:50 EDT Ventricular Rate:  56 PR Interval:    QRS Duration: 88 QT Interval:  462 QTC Calculation: 445 R Axis:   -38 Text Interpretation: Sinus bradycardia Left axis deviation Nonspecific ST and T wave abnormality  Abnormal ECG Confirmed by Elnora Morrison [phone]) on 02/26/2021 2:38:21 PM   Radiology DG Chest 2 View  Result Date: 02/26/2021 CLINICAL DATA:  Weakness on home oxygen. EXAM: CHEST - 2 VIEW COMPARISON:  November 05, 2020 FINDINGS: RIGHT IJ Port-A-Cath in situ. Post median sternotomy for CABG. Heart size remains enlarged. Opacity at the RIGHT lung base and LEFT mid chest as on the prior study. Potentially diminished effusion in the RIGHT chest compared to previous imaging. On limited assessment no acute skeletal process. IMPRESSION: 1. Opacity at the RIGHT lung base and LEFT mid chest, similar to the prior study. Patient with bilateral lung masses. No definite acute process. Difficult to exclude superimposed pneumonia given pattern of disease. 2. Potentially diminished RIGHT pleural effusion. Electronically Signed   By: Zetta Bills M.D.   On: 02/26/2021 13:28    Procedures Procedures   Medications Ordered in ED Medications - No data to display  ED Course  I have reviewed the triage vital signs  and the nursing notes.  Pertinent labs & imaging results that were available during my care of the patient were reviewed by me and considered in my medical decision making (see chart for details).    MDM Rules/Calculators/A&P                          Patient with multiple medical conditions followed outpatient by cardiology primary care doctor and oncology presents with general weakness and symptomatic bradycardia.  EKG reviewed sinus bradycardia with artifact.  Patient did have medication metoprolol this morning.  Instructed holding beta-blockers until advised otherwise by cardiology.  Broad differential diagnosis including metabolic, infectious, cardiac related, medication.  Plan for blood work check for anemia, electrolytes, kidney function, urine infection.  Chest x-ray reviewed showing small pleural effusion.  Urinalysis pending.  Blood work showed chronic kidney disease creatinine 1.4. Hb 11.8.   Discussed with cardiology for consult agreed with holding metoprolol and will see the patient.  Discussed with hospitalist for admission.   Final Clinical Impression(s) / ED Diagnoses Final diagnoses:  General weakness  Symptomatic bradycardia    Rx / DC Orders ED Discharge Orders    None       Elnora Morrison, MD 02/26/21 1447

## 2021-02-26 NOTE — ED Provider Notes (Signed)
  Physical Exam  BP (!) 160/51   Pulse 68   Temp (!) 97.5 F (36.4 C) (Oral)   Resp (!) 24   Ht 5\' 7"  (1.702 m)   Wt 100.7 kg   SpO2 99%   BMI 34.77 kg/m   Physical Exam  ED Course/Procedures     Procedures  MDM  I was called to bedside. Patient admitted for bradycardia. BP normal at bedside. HR in the 50-60s. Repeat rhythm showed afib. He has intermittent 2:1 block. Mag slightly low, will supplement. Cardiology consulted by hospitalist already. Will monitor        Drenda Freeze, MD 02/26/21 [phone]

## 2021-02-26 NOTE — Progress Notes (Signed)
ANTICOAGULATION CONSULT NOTE - Initial Consult  Pharmacy Consult for apixaban Indication: atrial fibrillation  Allergies  Allergen Reactions  . Actos [Pioglitazone] Swelling  . Metformin And Related Nausea Only    3.16.2022 pt is currently taking this medication at home  . Niaspan [Niacin Er] Itching and Rash  . Wound Dressing Adhesive Rash    Patient Measurements: Height: 5\' 7"  (170.2 cm) Weight: 100.7 kg (222 lb) IBW/kg (Calculated) : 66.1  Vital Signs: Temp: 97.5 F (36.4 C) (03/16 1215) Temp Source: Oral (03/16 1215) BP: 160/51 (03/16 1445) Pulse Rate: 68 (03/16 1445)  Labs: Recent Labs    02/26/21 1221  HGB 11.8*  HCT 39.5  PLT 131*  CREATININE 1.46*    Estimated Creatinine Clearance: 45.6 mL/min (A) (by C-G formula based on SCr of 1.46 mg/dL (H)).   Medical History: Past Medical History:  Diagnosis Date  . Aortic stenosis, mild 11/14/2013  . Aortic valve sclerosis 03/29/2015  . Bilateral leg edema 05/21/2014  . CAD (coronary artery disease)   . Chronic diastolic congestive heart failure ([place])   . Chronic kidney disease (CKD), stage III (moderate) (HCC)   . Diabetes 1.5, managed as type 1 ([place]) 02/04/2013  . Diabetic peripheral neuropathy associated with type 1 diabetes mellitus ([place]) 11/14/2013  . Dysphagia   . Dyspnea on exertion 03/21/2015  . Exogenous obesity   . Gout   . Heart attack ([place])   . History of nuclear stress test 07/2011   dipyridamole; fixed inferolateral defect, worse at stress than rest; no reversible ischemia; low risk scan   . Hyperlipidemia   . Hypertension   . Hypothyroidism   . Insulin dependent diabetes mellitus   . Left foot drop   . Left main coronary artery disease 03/28/2015  . lung ca dx'd 08/2020  . Memory loss   . Obesity (BMI 30-39.9) 11/14/2013  . Obstructive sleep apnea 03/21/2015  . OSA on CPAP    uses a cpap  . Peptic ulcer with hemorrhage 03/28/2015  . Peripheral neuropathy   . Pneumonia   . Rhabdomyolysis   . S/P  CABG x 2 03/29/2015   LIMA to Diagonal, SVG to OM, EVH via right thigh  . Stroke ([place])    L patietal with small scattered lacunar infarcts  . Thrombocytopenia ([place]) 03/21/2015  . Venous insufficiency   . Weakness generalized 03/21/2015    Medications:  (Not in a hospital admission)   Assessment: 77 YOM with h/o Afib on apixaban on home who presentd to the ED with generalized weakness and bradycardia. Pharmacy consulted to resume apixaban therapy. Of note, last dose of apixaban was this AM.   H/H low. SCr 1.46  Goal of Therapy:  Primary stroke prevention Monitor platelets by anticoagulation protocol: Yes   Plan:  -Resume home apixaban regimen of 2.5 mg twice daily -Monitor CBC and s/s of bleeding  Albertina Parr, PharmD., BCPS, BCCCP Clinical Pharmacist Please refer to [facility] for unit-specific pharmacist

## 2021-02-27 ENCOUNTER — Observation Stay (HOSPITAL_COMMUNITY): Payer: Medicare Other

## 2021-02-27 DIAGNOSIS — Z794 Long term (current) use of insulin: Secondary | ICD-10-CM | POA: Diagnosis not present

## 2021-02-27 DIAGNOSIS — I493 Ventricular premature depolarization: Secondary | ICD-10-CM | POA: Diagnosis not present

## 2021-02-27 DIAGNOSIS — N1831 Chronic kidney disease, stage 3a: Secondary | ICD-10-CM | POA: Diagnosis present

## 2021-02-27 DIAGNOSIS — I350 Nonrheumatic aortic (valve) stenosis: Secondary | ICD-10-CM

## 2021-02-27 DIAGNOSIS — Z8673 Personal history of transient ischemic attack (TIA), and cerebral infarction without residual deficits: Secondary | ICD-10-CM | POA: Diagnosis not present

## 2021-02-27 DIAGNOSIS — T441X5A Adverse effect of other parasympathomimetics [cholinergics], initial encounter: Secondary | ICD-10-CM | POA: Diagnosis present

## 2021-02-27 DIAGNOSIS — C3491 Malignant neoplasm of unspecified part of right bronchus or lung: Secondary | ICD-10-CM | POA: Diagnosis present

## 2021-02-27 DIAGNOSIS — G4733 Obstructive sleep apnea (adult) (pediatric): Secondary | ICD-10-CM | POA: Diagnosis present

## 2021-02-27 DIAGNOSIS — I252 Old myocardial infarction: Secondary | ICD-10-CM | POA: Diagnosis not present

## 2021-02-27 DIAGNOSIS — R0600 Dyspnea, unspecified: Secondary | ICD-10-CM

## 2021-02-27 DIAGNOSIS — I2510 Atherosclerotic heart disease of native coronary artery without angina pectoris: Secondary | ICD-10-CM | POA: Diagnosis present

## 2021-02-27 DIAGNOSIS — R001 Bradycardia, unspecified: Secondary | ICD-10-CM | POA: Diagnosis present

## 2021-02-27 DIAGNOSIS — Z9049 Acquired absence of other specified parts of digestive tract: Secondary | ICD-10-CM | POA: Diagnosis not present

## 2021-02-27 DIAGNOSIS — Z7901 Long term (current) use of anticoagulants: Secondary | ICD-10-CM | POA: Diagnosis not present

## 2021-02-27 DIAGNOSIS — I480 Paroxysmal atrial fibrillation: Secondary | ICD-10-CM | POA: Diagnosis not present

## 2021-02-27 DIAGNOSIS — Z888 Allergy status to other drugs, medicaments and biological substances status: Secondary | ICD-10-CM | POA: Diagnosis not present

## 2021-02-27 DIAGNOSIS — I441 Atrioventricular block, second degree: Secondary | ICD-10-CM | POA: Diagnosis not present

## 2021-02-27 DIAGNOSIS — Z79890 Hormone replacement therapy (postmenopausal): Secondary | ICD-10-CM | POA: Diagnosis not present

## 2021-02-27 DIAGNOSIS — I5043 Acute on chronic combined systolic (congestive) and diastolic (congestive) heart failure: Secondary | ICD-10-CM | POA: Diagnosis present

## 2021-02-27 DIAGNOSIS — Z87891 Personal history of nicotine dependence: Secondary | ICD-10-CM | POA: Diagnosis not present

## 2021-02-27 DIAGNOSIS — Z79899 Other long term (current) drug therapy: Secondary | ICD-10-CM | POA: Diagnosis not present

## 2021-02-27 DIAGNOSIS — Y92009 Unspecified place in unspecified non-institutional (private) residence as the place of occurrence of the external cause: Secondary | ICD-10-CM | POA: Diagnosis not present

## 2021-02-27 DIAGNOSIS — Z951 Presence of aortocoronary bypass graft: Secondary | ICD-10-CM | POA: Diagnosis not present

## 2021-02-27 DIAGNOSIS — R531 Weakness: Secondary | ICD-10-CM | POA: Diagnosis present

## 2021-02-27 DIAGNOSIS — I498 Other specified cardiac arrhythmias: Secondary | ICD-10-CM | POA: Diagnosis not present

## 2021-02-27 DIAGNOSIS — Z8249 Family history of ischemic heart disease and other diseases of the circulatory system: Secondary | ICD-10-CM | POA: Diagnosis not present

## 2021-02-27 DIAGNOSIS — H9190 Unspecified hearing loss, unspecified ear: Secondary | ICD-10-CM | POA: Diagnosis present

## 2021-02-27 DIAGNOSIS — Z833 Family history of diabetes mellitus: Secondary | ICD-10-CM | POA: Diagnosis not present

## 2021-02-27 DIAGNOSIS — E039 Hypothyroidism, unspecified: Secondary | ICD-10-CM | POA: Diagnosis present

## 2021-02-27 DIAGNOSIS — I361 Nonrheumatic tricuspid (valve) insufficiency: Secondary | ICD-10-CM | POA: Diagnosis not present

## 2021-02-27 DIAGNOSIS — Z809 Family history of malignant neoplasm, unspecified: Secondary | ICD-10-CM | POA: Diagnosis not present

## 2021-02-27 DIAGNOSIS — I130 Hypertensive heart and chronic kidney disease with heart failure and stage 1 through stage 4 chronic kidney disease, or unspecified chronic kidney disease: Secondary | ICD-10-CM | POA: Diagnosis present

## 2021-02-27 DIAGNOSIS — Z20822 Contact with and (suspected) exposure to covid-19: Secondary | ICD-10-CM | POA: Diagnosis present

## 2021-02-27 LAB — ECHOCARDIOGRAM COMPLETE
AR max vel: 0.93 cm2
AV Area VTI: 0.89 cm2
AV Area mean vel: 0.95 cm2
AV Mean grad: 16 mmHg
AV Peak grad: 30.3 mmHg
Ao pk vel: 2.75 m/s
Area-P 1/2: 5.75 cm2
Height: 67 in
S' Lateral: 4 cm
Weight: 3552 oz

## 2021-02-27 LAB — CBC WITH DIFFERENTIAL/PLATELET
Abs Immature Granulocytes: 0.02 10*3/uL (ref 0.00–0.07)
Basophils Absolute: 0.1 10*3/uL (ref 0.0–0.1)
Basophils Relative: 1 %
Eosinophils Absolute: 0.1 10*3/uL (ref 0.0–0.5)
Eosinophils Relative: 1 %
HCT: 36.6 % — ABNORMAL LOW (ref 39.0–52.0)
Hemoglobin: 11 g/dL — ABNORMAL LOW (ref 13.0–17.0)
Immature Granulocytes: 0 %
Lymphocytes Relative: 20 %
Lymphs Abs: 1.2 10*3/uL (ref 0.7–4.0)
MCH: 28.1 pg (ref 26.0–34.0)
MCHC: 30.1 g/dL (ref 30.0–36.0)
MCV: 93.6 fL (ref 80.0–100.0)
Monocytes Absolute: 0.8 10*3/uL (ref 0.1–1.0)
Monocytes Relative: 13 %
Neutro Abs: 3.9 10*3/uL (ref 1.7–7.7)
Neutrophils Relative %: 65 %
Platelets: 137 10*3/uL — ABNORMAL LOW (ref 150–400)
RBC: 3.91 MIL/uL — ABNORMAL LOW (ref 4.22–5.81)
RDW: 15.3 % (ref 11.5–15.5)
WBC: 6 10*3/uL (ref 4.0–10.5)
nRBC: 0 % (ref 0.0–0.2)

## 2021-02-27 LAB — COMPREHENSIVE METABOLIC PANEL
ALT: 15 U/L (ref 0–44)
AST: 22 U/L (ref 15–41)
Albumin: 3 g/dL — ABNORMAL LOW (ref 3.5–5.0)
Alkaline Phosphatase: 114 U/L (ref 38–126)
Anion gap: 8 (ref 5–15)
BUN: 19 mg/dL (ref 8–23)
CO2: 30 mmol/L (ref 22–32)
Calcium: 9 mg/dL (ref 8.9–10.3)
Chloride: 102 mmol/L (ref 98–111)
Creatinine, Ser: 1.42 mg/dL — ABNORMAL HIGH (ref 0.61–1.24)
GFR, Estimated: 50 mL/min — ABNORMAL LOW (ref >60)
Glucose, Bld: 116 mg/dL — ABNORMAL HIGH (ref 70–99)
Potassium: 3.4 mmol/L — ABNORMAL LOW (ref 3.5–5.1)
Sodium: 140 mmol/L (ref 135–145)
Total Bilirubin: 0.8 mg/dL (ref 0.3–1.2)
Total Protein: 6.3 g/dL — ABNORMAL LOW (ref 6.5–8.1)

## 2021-02-27 LAB — MAGNESIUM: Magnesium: 2.3 mg/dL (ref 1.7–2.4)

## 2021-02-27 LAB — GLUCOSE, CAPILLARY
Glucose-Capillary: 144 mg/dL — ABNORMAL HIGH (ref 70–99)
Glucose-Capillary: 179 mg/dL — ABNORMAL HIGH (ref 70–99)

## 2021-02-27 LAB — CBG MONITORING, ED: Glucose-Capillary: 94 mg/dL (ref 70–99)

## 2021-02-27 LAB — BRAIN NATRIURETIC PEPTIDE: B Natriuretic Peptide: 1168.2 pg/mL — ABNORMAL HIGH (ref 0.0–100.0)

## 2021-02-27 LAB — SARS CORONAVIRUS 2 (TAT 6-24 HRS): SARS Coronavirus 2: NEGATIVE

## 2021-02-27 MED ORDER — SODIUM CHLORIDE 0.9% FLUSH
10.0000 mL | INTRAVENOUS | Status: DC | PRN
Start: 2021-02-27 — End: 2021-02-27

## 2021-02-27 MED ORDER — POTASSIUM CHLORIDE CRYS ER 20 MEQ PO TBCR: 40.0000 meq | EXTENDED_RELEASE_TABLET | Freq: Once | ORAL | Status: DC

## 2021-02-27 MED ORDER — SODIUM CHLORIDE 0.9% FLUSH
10.0000 mL | Freq: Two times a day (BID) | INTRAVENOUS | Status: DC
  Administered 2021-02-27 – 2021-03-03 (×9): 10 mL

## 2021-02-27 MED ORDER — CHLORHEXIDINE GLUCONATE CLOTH 2 % EX PADS
6.0000 | MEDICATED_PAD | Freq: Every day | CUTANEOUS | Status: DC
  Administered 2021-02-28 – 2021-03-03 (×4): 6 via TOPICAL

## 2021-02-27 MED ORDER — FUROSEMIDE 10 MG/ML IJ SOLN
80.0000 mg | Freq: Two times a day (BID) | INTRAMUSCULAR | Status: DC
  Administered 2021-02-27: 80 mg via INTRAVENOUS
  Filled 2021-02-27: qty 8

## 2021-02-27 MED ORDER — POTASSIUM CHLORIDE CRYS ER 20 MEQ PO TBCR
40.0000 meq | EXTENDED_RELEASE_TABLET | Freq: Two times a day (BID) | ORAL | Status: DC
  Administered 2021-02-27 – 2021-03-01 (×5): 40 meq via ORAL
  Filled 2021-02-27 (×7): qty 2

## 2021-02-27 MED ORDER — POTASSIUM CHLORIDE CRYS ER 20 MEQ PO TBCR: 40.0000 meq | EXTENDED_RELEASE_TABLET | Freq: Three times a day (TID) | ORAL | Status: DC

## 2021-02-27 NOTE — ED Notes (Signed)
MD at bedside. 

## 2021-02-27 NOTE — ED Notes (Signed)
Full bed change complete. Gown changed. Pt verbalized comfort.

## 2021-02-27 NOTE — Progress Notes (Signed)
  Echocardiogram 2D Echocardiogram has been performed.  Randall Wilkins 02/27/2021, 12:03 PM

## 2021-02-27 NOTE — Evaluation (Signed)
Physical Therapy Evaluation Patient Details Name: Randall Wilkins MRN: [id number] DOB: 1941/11/17 Today's Date: 02/27/2021   History of Present Illness  Pt is an 80 y/o male admitted 3/16 secondary to symptomatic bradycardia. Thought to be secondary to medication. PMH includes CHF, CAD s/p CABG, a fib, and lung cancer.  Clinical Impression  Pt admitted secondary to problem above with deficits below. Pt requiring min A for transfers and short distance gait. Pt reporting increased weakness in BLE compared to baseline. Discussed SNF with pt and pt's wife, however, pt's wife reports she wants to take pt home and continue HHPT/HHOT. Discussed using transport chair inside for increased safety initially. Will continue to follow acutely to maximize functional mobility independence and safety.     Follow Up Recommendations Home health PT;Supervision/Assistance - 24 hour (Pt's wife refusing SNF)    Equipment Recommendations  None recommended by PT    Recommendations for Other Services       Precautions / Restrictions Precautions Precautions: Fall Required Braces or Orthoses: Other Brace Other Brace: L AFO Restrictions Weight Bearing Restrictions: No      Mobility  Bed Mobility               General bed mobility comments: Sitting EOB upon entry    Transfers Overall transfer level: Needs assistance Equipment used: Rolling walker (2 wheeled) Transfers: Sit to/from Stand Sit to Stand: Min assist;From elevated surface         General transfer comment: Min A for steadying assist to stand. Cues for hand placement.  Ambulation/Gait Ambulation/Gait assistance: Min assist Gait Distance (Feet): 3 Feet Assistive device: Rolling walker (2 wheeled) Gait Pattern/deviations: Decreased step length - right;Decreased step length - left;Step-through pattern Gait velocity: Decreased   General Gait Details: Short distance ambulation to chair. BLE very shaky so further mobility limited. Min A  for steadying.  Stairs            Wheelchair Mobility    Modified Rankin (Stroke Patients Only)       Balance Overall balance assessment: Needs assistance Sitting-balance support: No upper extremity supported;Feet supported Sitting balance-Leahy Scale: Good     Standing balance support: Bilateral upper extremity supported;During functional activity Standing balance-Leahy Scale: Poor Standing balance comment: Reliant on BUE support                             Pertinent Vitals/Pain Pain Assessment: No/denies pain    Home Living Family/patient expects to be discharged to:: Private residence Living Arrangements: Spouse/significant other Available Help at Discharge: Family;Available 24 hours/day Type of Home: House Home Access: Ramped entrance     Home Layout: One level Home Equipment: Grab bars - tub/shower;Walker - 2 wheels;Hand held shower head;Walker - 4 wheels;Electric scooter;Shower seat - built in;Grab bars - toilet;Transport chair      Prior Function Level of Independence: Needs assistance   Gait / Transfers Assistance Needed: Pt reports using RW for short ambulation distances. Wife reports using transport chair when out of house.  ADL's / Homemaking Assistance Needed: Pt's wife assists with shower transfers and lower body dressing.        Hand Dominance        Extremity/Trunk Assessment   Upper Extremity Assessment Upper Extremity Assessment: Defer to OT evaluation    Lower Extremity Assessment Lower Extremity Assessment: Generalized weakness;LLE deficits/detail LLE Deficits / Details: L foot drop at baseline    Cervical / Trunk Assessment Cervical / Trunk  Assessment: Kyphotic  Communication   Communication: HOH  Cognition Arousal/Alertness: Awake/alert Behavior During Therapy: WFL for tasks assessed/performed Overall Cognitive Status: History of cognitive impairments - at baseline                                  General Comments: Pt's wife reports pt has memory deficits at baseline.      General Comments General comments (skin integrity, edema, etc.): Pt's wife present throughout session. Discussed SNF, however, pt's wife refusing at this time and reports she wants to take pt home and continue HHPT.    Exercises     Assessment/Plan    PT Assessment Patient needs continued PT services  PT Problem List Decreased strength;Decreased balance;Decreased activity tolerance;Decreased mobility;Decreased safety awareness;Decreased knowledge of precautions;Decreased cognition;Decreased knowledge of use of DME       PT Treatment Interventions DME instruction;Gait training;Functional mobility training;Therapeutic activities;Therapeutic exercise;Balance training;Patient/family education    PT Goals (Current goals can be found in the Care Plan section)  Acute Rehab PT Goals Patient Stated Goal: to go home per wife PT Goal Formulation: With patient/family Time For Goal Achievement: 03/13/21 Potential to Achieve Goals: Good    Frequency Min 3X/week   Barriers to discharge        Co-evaluation               AM-PAC PT "6 Clicks" Mobility  Outcome Measure Help needed turning from your back to your side while in a flat bed without using bedrails?: A Little Help needed moving from lying on your back to sitting on the side of a flat bed without using bedrails?: A Little Help needed moving to and from a bed to a chair (including a wheelchair)?: A Little Help needed standing up from a chair using your arms (e.g., wheelchair or bedside chair)?: A Little Help needed to walk in hospital room?: A Lot Help needed climbing 3-5 steps with a railing? : A Lot 6 Click Score: 16    End of Session Equipment Utilized During Treatment: Gait belt Activity Tolerance: Patient limited by fatigue Patient left: in chair;with call bell/phone within reach;with chair alarm set Nurse Communication: Mobility status PT  Visit Diagnosis: Unsteadiness on feet (R26.81);Muscle weakness (generalized) (M62.81)    Time: 5537-4827 PT Time Calculation (min) (ACUTE ONLY): 23 min   Charges:   PT Evaluation $PT Eval Moderate Complexity: 1 Mod PT Treatments $Therapeutic Activity: 8-22 mins        Lou Miner, DPT  Acute Rehabilitation Services  Pager: [phone] Office: [phone]   Rudean Hitt 02/27/2021, 3:36 PM

## 2021-02-27 NOTE — ED Notes (Signed)
Pharmacy notified, pt wife has chemo meds from home.

## 2021-02-27 NOTE — Evaluation (Signed)
Occupational Therapy Evaluation Patient Details Name: Randall Wilkins MRN: [id number] DOB: [birth year] Today's Date: 02/27/2021    History of Present Illness Pt is an 80 y/o male admitted 3/16 secondary to symptomatic bradycardia. Thought to be secondary to medication. PMH includes CHF, CAD s/p CABG, a fib, and lung cancer.   Clinical Impression   Patient admitted for the above diagnosis.  PTA, he was on home health services and progressing with household distances and ADL, but still needed assist and support.  Deficits are listed below.  Currently, he is needing up to [place] for basic transfers, and up to Mod A for lower body ADL.  OT is indicated to maximize his functional status for an eventual return home with [facility] services.      Follow Up Recommendations  Home health OT    Equipment Recommendations  None recommended by OT    Recommendations for Other Services       Precautions / Restrictions Precautions Precautions: Fall Required Braces or Orthoses: Other Brace Other Brace: L AFO Restrictions Weight Bearing Restrictions: No      Mobility Bed Mobility               General bed mobility comments: Sitting EOB upon entry Patient Response: Cooperative  Transfers Overall transfer level: Needs assistance Equipment used: Rolling walker (2 wheeled) Transfers: Sit to/from Stand Sit to Stand: Min assist         General transfer comment: Min A for steadying assist to stand. Cues for hand placement.    Balance Overall balance assessment: Needs assistance Sitting-balance support: No upper extremity supported;Feet supported Sitting balance-Leahy Scale: Good     Standing balance support: Bilateral upper extremity supported;During functional activity Standing balance-Leahy Scale: Poor Standing balance comment: Reliant on BUE support                           ADL either performed or assessed with clinical judgement   ADL Overall ADL's : Needs  assistance/impaired Eating/Feeding: Set up;Sitting   Grooming: Wash/dry hands;Wash/dry face;Set up;Sitting           Upper Body Dressing : Supervision/safety;Sitting   Lower Body Dressing: Moderate assistance;Sitting/lateral leans                       Vision Patient Visual Report: No change from baseline                  Pertinent Vitals/Pain Pain Assessment: No/denies pain     Hand Dominance Right   Extremity/Trunk Assessment Upper Extremity Assessment Upper Extremity Assessment: Generalized weakness   Lower Extremity Assessment Lower Extremity Assessment: Defer to PT evaluation LLE Deficits / Details: L foot drop at baseline   Cervical / Trunk Assessment Cervical / Trunk Assessment: Kyphotic   Communication Communication Communication: HOH   Cognition Arousal/Alertness: Awake/alert Behavior During Therapy: WFL for tasks assessed/performed Overall Cognitive Status: History of cognitive impairments - at baseline                                 General Comments: Pt's wife reports pt has memory deficits at baseline.   General Comments  Pt's wife present throughout session. Discussed SNF, however, pt's wife refusing at this time and reports she wants to take pt home and continue HHPT.  Home Living Family/patient expects to be discharged to:: Private residence Living Arrangements: Spouse/significant other Available Help at Discharge: Family;Available 24 hours/day Type of Home: House Home Access: Ramped entrance     Home Layout: One level     Bathroom Shower/Tub: Occupational psychologist: Handicapped height     Home Equipment: Grab bars - tub/shower;Walker - 2 wheels;Hand held Tourist information centre manager - 4 wheels;Electric scooter;Shower seat - built in;Grab bars - toilet;Transport chair;Adaptive equipment Adaptive Equipment: Reacher;Long-handled sponge        Prior Functioning/Environment Level of  Independence: Needs assistance  Gait / Transfers Assistance Needed: Pt reports using RW for short ambulation distances. Wife reports using transport chair when out of house. ADL's / Homemaking Assistance Needed: Pt's wife assists with shower transfers and lower body dressing. Communication / Swallowing Assistance Needed: HOH Comments: pt and wife report increased weakness        OT Problem List: Decreased strength;Decreased range of motion;Decreased activity tolerance;Impaired balance (sitting and/or standing);Decreased safety awareness;Decreased knowledge of use of DME or AE      OT Treatment/Interventions: Self-care/ADL training;Therapeutic exercise;DME and/or AE instruction;Therapeutic activities;Balance training    OT Goals(Current goals can be found in the care plan section) Acute Rehab OT Goals Patient Stated Goal: to get home, and start [facility] again. OT Goal Formulation: With patient Time For Goal Achievement: 03/13/21 Potential to Achieve Goals: Good ADL Goals Pt Will Perform Grooming: with supervision;sitting;standing Pt Will Perform Lower Body Bathing: with supervision;sit to/from stand Pt Will Perform Lower Body Dressing: with supervision;sit to/from stand Pt Will Transfer to Toilet: with min guard assist;ambulating;regular height toilet Pt Will Perform Toileting - Clothing Manipulation and hygiene: with min guard assist;sit to/from stand  OT Frequency: Min 2X/week   Barriers to D/C:    none noted                     AM-PAC OT "6 Clicks" Daily Activity     Outcome Measure Help from another person eating meals?: None Help from another person taking care of personal grooming?: A Little Help from another person toileting, which includes using toliet, bedpan, or urinal?: A Lot Help from another person bathing (including washing, rinsing, drying)?: A Lot Help from another person to put on and taking off regular upper body clothing?: A Little Help from another person to  put on and taking off regular lower body clothing?: A Lot 6 Click Score: 16   End of Session Equipment Utilized During Treatment: Rolling walker;Oxygen Nurse Communication: Other (comment) (spouse concerned with chemo med)  Activity Tolerance: Patient tolerated treatment well Patient left: in chair;with call bell/phone within reach;with chair alarm set  OT Visit Diagnosis: Unsteadiness on feet (R26.81);Muscle weakness (generalized) (M62.81);History of falling (Z91.81)                Time: 6270-3500 OT Time Calculation (min): 22 min Charges:  OT General Charges $OT Visit: 1 Visit OT Evaluation $OT Eval Moderate Complexity: 1 Mod  02/27/2021  Rich, OTR/L  Acute Rehabilitation Services  Office:  [place] 02/27/2021, 5:19 PM

## 2021-02-27 NOTE — ED Notes (Signed)
Echo at bedside

## 2021-02-27 NOTE — Progress Notes (Addendum)
Progress Note  Patient Name: Randall Wilkins Date of Encounter: 02/27/2021  [facility] Cardiologist: Pixie Casino, MD   Subjective   No dyspnea or cp. No dizziness. HR improved.   Inpatient Medications    Scheduled Meds: . allopurinol  100 mg Oral Daily  . amLODipine  10 mg Oral Daily  . apixaban  2.5 mg Oral BID  . Chlorhexidine Gluconate Cloth  6 each Topical Daily  . insulin aspart  0-5 Units Subcutaneous QHS  . insulin aspart  0-9 Units Subcutaneous TID WC  . insulin glargine  20 Units Subcutaneous QHS  . levothyroxine  75 mcg Oral QAC breakfast  . potassium chloride  40 mEq Oral TID  . sodium chloride flush  10-40 mL Intracatheter Q12H  . sotorasib  960 mg Oral q morning  . tamsulosin  0.4 mg Oral Daily  . umeclidinium-vilanterol  1 puff Inhalation Daily  . vitamin B-12  1,000 mcg Oral Daily   Continuous Infusions:  PRN Meds: acetaminophen **OR** [DISCONTINUED] acetaminophen, albuterol, hydrALAZINE, [DISCONTINUED] ondansetron **OR** ondansetron (ZOFRAN) IV   Vital Signs    Vitals:   02/26/21 2145 02/27/21 0015 02/27/21 0319 02/27/21 0630  BP: (!) 159/73 (!) 149/70 (!) 145/64 (!) 170/64  Pulse: 76 68 70 63  Resp: (!) 22 20 20 20   Temp:      TempSrc:      SpO2: 95% 92% 100% 99%  Weight:      Height:        Intake/Output Summary (Last 24 hours) at 02/27/2021 0754 Last data filed at 02/26/2021 1750 Gross per 24 hour  Intake 100 ml  Output --  Net 100 ml   Last 3 Weights 02/26/2021 02/18/2021 02/06/2021  Weight (lbs) 222 lb 226 lb 8 oz 232 lb  Weight (kg) 100.699 kg 102.74 kg 105.235 kg      Telemetry    SR with PVCS and ventricular bigeminy - Personally Reviewed  ECG    N/A  Physical Exam   GEN: No acute distress.   Neck: No JVD Cardiac: RRR, + murmurs, rubs, or gallops.  Respiratory: Clear to auscultation bilaterally. GI: Soft, nontender, non-distended  MS: Trace BL LE  edema; No deformity. Neuro:  Nonfocal  Psych: Normal affect    Labs    High Sensitivity Troponin:  No results for input(s): TROPONINIHS in the last 720 hours.    Chemistry Recent Labs  Lab 02/26/21 1221 02/27/21 0439  NA 140 140  K 3.8 3.4*  CL 103 102  CO2 29 30  GLUCOSE 114* 116*  BUN 22 19  CREATININE 1.46* 1.42*  CALCIUM 8.9 9.0  PROT  --  6.3*  ALBUMIN  --  3.0*  AST  --  22  ALT  --  15  ALKPHOS  --  114  BILITOT  --  0.8  GFRNONAA 48* 50*  ANIONGAP 8 8     Hematology Recent Labs  Lab 02/26/21 1221 02/27/21 0439  WBC 5.6 6.0  RBC 4.20* 3.91*  HGB 11.8* 11.0*  HCT 39.5 36.6*  MCV 94.0 93.6  MCH 28.1 28.1  MCHC 29.9* 30.1  RDW 15.3 15.3  PLT 131* 137*    BNP Recent Labs  Lab 02/27/21 0439  BNP 1,168.2*      Radiology    DG Chest 2 View  Result Date: 02/26/2021 CLINICAL DATA:  Weakness on home oxygen. EXAM: CHEST - 2 VIEW COMPARISON:  November 05, 2020 FINDINGS: RIGHT IJ Port-A-Cath in situ. Post median  sternotomy for CABG. Heart size remains enlarged. Opacity at the RIGHT lung base and LEFT mid chest as on the prior study. Potentially diminished effusion in the RIGHT chest compared to previous imaging. On limited assessment no acute skeletal process. IMPRESSION: 1. Opacity at the RIGHT lung base and LEFT mid chest, similar to the prior study. Patient with bilateral lung masses. No definite acute process. Difficult to exclude superimposed pneumonia given pattern of disease. 2. Potentially diminished RIGHT pleural effusion. Electronically Signed   By: Zetta Bills M.D.   On: 02/26/2021 13:28    Cardiac Studies   Pending echo  Patient Profile     80 y.o. 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 Candiss Norse.  Assessment & Plan    1. Bradycardia  with intermittent mobitz 1 - HR improved to 60-70s with DC of metoprolol and Aricept (recently started), due to combination.  2. PVCs/Verticular bigeminy - Frequent - Magnesium corrected - Supplement K - Goal is to keep K > 4 & Mg > 2 - Stopped BB 2nd to bradycardia  3. PAF - Maintaining sinus rhythm - On Eliquis for anticoagulation   4. Chronic combined CHF - BNP 1168 - Pending echo today  - Mild LE edema   5. CAD s/p CABG - No angina  6.  Stage IV non-small cell lung cancer:  - Followed by Dr. Earlie Server at the cancer center  7.  Aortic stenosis:  - pending echo   8. Hypomagnesemia - Resolved   For questions or updates, please contact [place] Please consult [email or web address] for contact info under        SignedLeanor Kail, PA  02/27/2021, 7:54 AM    ATTENDING ATTESTATION  I have seen, examined and evaluated the patient this PM along with Mr. Curly [place], [place].  After reviewing all the available data and chart, we discussed the patients laboratory, study & physical findings as well as symptoms in detail. I agree with his findings, examination as well as impression recommendations as per our discussion.    Echocardiogram complete, EF is down a little bit to 40 and 45% with global HK.  RV pressures seem to be severely elevated.  Aortic valve is thickened and calcified, but mobile-- only mild aortic stenosis.  EF is not that much down below what it had been which could be related to volume overload, however the right ventricular pressures are quite elevated which is somewhat concerning.  Interestingly, he does not appear to have significant amount of JVD.  I suspect that the elevated PA pressures are related to his lung cancer.  Heart rate seems to have notably improved, but he is having PVCs.  The monitor occasionally reads rates in the 20s, but it is actually not accurate and that there are sinus beats.  Agree with holding beta-blocker and Aricept.  At this point  I think we simply need to start diuresing.  Very difficult to diurese right-sided failure.  If not able to get decent diuresis, may consider use of inotrope which such as milrinone.  We will write for furosemide today => we will also need to follow-up follow potassium levels closely.  I will increase potassium supplementation.   Glenetta Hew, M.D., M.S. Interventional Cardiologist   Pager #  [phone] Phone # [phone] [address]. [place], [place] [address]

## 2021-02-27 NOTE — Progress Notes (Signed)
PROGRESS NOTE                                                                                                                                                                                                             Patient Demographics:    Randall Wilkins, is a 80 y.o. male, DOB - 12-Oct-1941, CHE:[id number]  Admit date - 02/26/2021   Admitting Physician Thurnell Lose, MD  Outpatient Primary MD for the patient is Randall Arabian, MD  LOS - 0  Chief Complaint  Patient presents with  . Fatigue  . Bradycardia       Brief Narrative (HPI from H&P) Randall Wilkins  is a 80 y.o. 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 Curt Bears on daily oral chemotherapy, paroxysmal A. fib [place] vas 2 score of 6, CAD s/p CABG, stroke in the past, CKD 3 a baseline creatinine around 1.4, hypertension, dyslipidemia, DM type II, aortic valve stenosis, combined chronic systolic and diastolic heart failure last EF around 45%, left leg weakness after bout of severe rhabdomyolysis wears a brace, memory dysfunction placed on Aricept, he developed severe generalized weakness, difficulty to ambulate, home PT noticed that his heart rate was in high 20s, he came to the hospital where he was found to have sinus bradycardia along with signs of CHF and admitted to the hospital.   Subjective:    Randall Wilkins today has, No headache, No chest pain, No abdominal pain - No Nausea, No new weakness tingling or numbness, No Cough - SOB.     Assessment  & Plan :       1.  Symptomatic bradycardia.  This could be due to combination of beta-blocker use and recent Aricept use. His TSH is stable, holding offending medications, still extremely weak and in mild CHF, PT to evaluate, may require placement to SNF, currently not safe discharge.  Cardiology following.  2.  Paroxysmal A. fib with [place] vas 2 score of 6. Continue Eliquis  for anticoagulation, hold beta-blocker, cardiology on board heart rate improved after prednisone wean.  3.  CAD s/p CABG.  Currently chest pain-free with nonacute EKG, continue Eliquis hold beta-blocker.  4.  Aortic stenosis. TTE ordered, has a very loud murmur, could have high-grade stenosis and may require TAVR, cardiology following.  5.  BPH.  Continue home dose alpha-blocker.  6.  Rhabdomyolysis induced left leg muscular injury causing left leg weakness.  Continue wearing brace when out of bed, PT OT.  7.  Previous history of stroke.  Supportive care.  Continue Eliquis.  8.  Stage IV non-small cell lung cancer -continue oral chemotherapy, Dr. Julien Nordmann has been informed.  9.  Obesity with OSA.  Follow with PCP for weight loss BMI 34, nighttime oxygen to be continued when he sleeps.  10.  CKD stage IIIa.  Creatinine appears to be close to baseline.  Will monitor.  11. Dehydration.    Has been hydrated with IVF.  13.  Chronic combined systolic and diastolic heart failure last EF around 45%.  Lasix PRN now.  14. Hypothyroidism.  stable TSH continue home dose Synthroid.  15.  Hypertension.  Placed on Norvasc along with as needed IV hydralazine.   16.  Early dementia.  Hold Aricept due to bradycardia, at risk for delirium, patient and husband both want that patient can get delirious while he is in the hospital, use Haldol if needed, minimize narcotics and benzodiazepines  17. Hypokalemia.  Replaced.    18. DM type II.  On Tresiba along with sliding scale to continue.  Lab Results  Component Value Date   HGBA1C 6.7 (H) 02/26/2021   CBG (last 3)  Recent Labs    02/26/21 1630 02/26/21 2109 02/27/21 0856  GLUCAP 74 139* 94   Lab Results  Component Value Date   TSH 1.184 02/26/2021      Condition - Extremely Guarded  Family Communication  :  Wife bedside 3/16, 3/17  Code Status :  Full  Consults  :  Cards  Procedures  :    TTE  PUD Prophylaxis :  None  Disposition Plan  :    Status is: Observation  Dispo: The patient is from: Home              Anticipated d/c is to: TBD              Patient currently is not medically stable to d/c.   Difficult to place patient No  DVT Prophylaxis  :  Eliquis  Lab Results  Component Value Date   PLT 137 (L) 02/27/2021    Diet :  Diet Order            Diet Carb Modified Fluid consistency: Thin; Room service appropriate? Yes  Diet effective now                  Inpatient Medications Scheduled Meds: . allopurinol  100 mg Oral Daily  . amLODipine  10 mg Oral Daily  . apixaban  2.5 mg Oral BID  . Chlorhexidine Gluconate Cloth  6 each Topical Daily  . insulin aspart  0-5 Units Subcutaneous QHS  . insulin aspart  0-9 Units Subcutaneous TID WC  . insulin glargine  20 Units Subcutaneous QHS  . levothyroxine  75 mcg Oral QAC breakfast  . potassium chloride  40 mEq Oral BID  . sodium chloride flush  10-40 mL Intracatheter Q12H  . sotorasib  960 mg Oral q morning  . tamsulosin  0.4 mg Oral Daily  . umeclidinium-vilanterol  1 puff Inhalation Daily  . vitamin B-12  1,000 mcg Oral Daily   Continuous Infusions: PRN Meds:.acetaminophen **OR** [DISCONTINUED] acetaminophen, albuterol, hydrALAZINE, [DISCONTINUED] ondansetron **OR** ondansetron (ZOFRAN) IV  Antibiotics  :   Anti-infectives (From admission, onward)   None          Objective:   Vitals:   02/27/21 0759 02/27/21 0900 02/27/21 1010 02/27/21 1043  BP: (!) 162/81 (!) 147/70 (!) 156/74 (!) 156/74  Pulse:  65  68  Resp: 15 15  18   Temp: 98.1 F (36.7 C)     TempSrc: Oral     SpO2: 99% 97%  100%  Weight:      Height:        SpO2: 100 % O2 Flow Rate (L/min): 3 L/min  Wt Readings from Last 3 Encounters:  02/26/21 100.7 kg  02/18/21 102.7 kg  02/06/21 105.2 kg     Intake/Output Summary (Last 24 hours) at 02/27/2021 1055 Last data filed at 02/26/2021 1750 Gross per 24 hour  Intake 100 ml  Output --  Net 100 ml      Physical Exam  Awake Alert, No new F.N deficits, Normal affect [place].AT,PERRAL Supple Neck,No JVD, No cervical lymphadenopathy appriciated.  Symmetrical Chest wall movement, Good air movement bilaterally, few rales RRR,No Gallops, ++ systolic murmur, No Parasternal Heave +ve B.Sounds, Abd Soft, No tenderness, No organomegaly appriciated, No rebound - guarding or rigidity. No Cyanosis, Clubbing or edema, No new Rash or bruise         Data Review:   Recent Labs  Lab 02/26/21 1221 02/27/21 0439  WBC 5.6 6.0  HGB 11.8* 11.0*  HCT 39.5 36.6*  PLT 131* 137*  MCV 94.0 93.6  MCH 28.1 28.1  MCHC 29.9* 30.1  RDW 15.3 15.3  LYMPHSABS  --  1.2  MONOABS  --  0.8  EOSABS  --  0.1  BASOSABS  --  0.1    Recent Labs  Lab 02/26/21 1221 02/26/21 1248 02/26/21 1250 02/26/21 1514 02/27/21 0439  NA 140  --   --   --  140  K 3.8  --   --   --  3.4*  CL 103  --   --   --  102  CO2 29  --   --   --  30  GLUCOSE 114*  --   --   --  116*  BUN 22  --   --   --  19  CREATININE 1.46*  --   --   --  1.42*  CALCIUM 8.9  --   --   --  9.0  AST  --   --   --   --  22  ALT  --   --   --   --  15  ALKPHOS  --   --   --   --  114  BILITOT  --   --   --   --  0.8  ALBUMIN  --   --   --   --  3.0*  MG  --  1.1*  --   --  2.3  TSH  --   --  1.184  --   --   HGBA1C  --   --   --  6.7*  --   BNP  --   --   --   --  1,168.2*    Recent Labs  Lab 02/27/21 0439  BNP 1,168.2*    ------------------------------------------------------------------------------------------------------------------ No results for input(s): CHOL, HDL, LDLCALC, TRIG, CHOLHDL, LDLDIRECT in the last 72 hours.  Lab Results  Component Value Date   HGBA1C 6.7 (H) 02/26/2021   ------------------------------------------------------------------------------------------------------------------ Recent Labs    02/26/21 1250  TSH 1.184    ------------------------------------------------------------------------------------------------------------------ No results for input(s): VITAMINB12, FOLATE, FERRITIN, TIBC, IRON, RETICCTPCT in the last 72 hours.  Coagulation profile No results for input(s): INR,  PROTIME in the last 168 hours.  No results for input(s): DDIMER in the last 72 hours.  Cardiac Enzymes No results for input(s): CKMB, TROPONINI, MYOGLOBIN in the last 168 hours.  Invalid input(s): CK ------------------------------------------------------------------------------------------------------------------    Component Value Date/Time   BNP 1,168.2 (H) 02/27/2021 0439    Micro Results Recent Results (from the past 240 hour(s))  Respiratory (~20 pathogens) panel by PCR     Status: None   Collection Time: 02/26/21  4:20 PM   Specimen: Nasopharyngeal Swab; Respiratory  Result Value Ref Range Status   Adenovirus NOT DETECTED NOT DETECTED Final   Coronavirus 229E NOT DETECTED NOT DETECTED Final    Comment: (NOTE) The Coronavirus on the Respiratory Panel, DOES NOT test for the novel  Coronavirus (2019 nCoV)    Coronavirus HKU1 NOT DETECTED NOT DETECTED Final   Coronavirus NL63 NOT DETECTED NOT DETECTED Final   Coronavirus OC43 NOT DETECTED NOT DETECTED Final   Metapneumovirus NOT DETECTED NOT DETECTED Final   Rhinovirus / Enterovirus NOT DETECTED NOT DETECTED Final   Influenza A NOT DETECTED NOT DETECTED Final   Influenza B NOT DETECTED NOT DETECTED Final   Parainfluenza Virus 1 NOT DETECTED NOT DETECTED Final   Parainfluenza Virus 2 NOT DETECTED NOT DETECTED Final   Parainfluenza Virus 3 NOT DETECTED NOT DETECTED Final   Parainfluenza Virus 4 NOT DETECTED NOT DETECTED Final   Respiratory Syncytial Virus NOT DETECTED NOT DETECTED Final   Bordetella pertussis NOT DETECTED NOT DETECTED Final   Bordetella Parapertussis NOT DETECTED NOT DETECTED Final   Chlamydophila pneumoniae NOT DETECTED NOT DETECTED Final    Mycoplasma pneumoniae NOT DETECTED NOT DETECTED Final    Comment: Performed at [facility], [place]. [address] [place], [place] [address]    Radiology Reports DG Chest 2 View  Result Date: 02/26/2021 CLINICAL DATA:  Weakness on home oxygen. EXAM: CHEST - 2 VIEW COMPARISON:  November 05, 2020 FINDINGS: RIGHT IJ Port-A-Cath in situ. Post median sternotomy for CABG. Heart size remains enlarged. Opacity at the RIGHT lung base and LEFT mid chest as on the prior study. Potentially diminished effusion in the RIGHT chest compared to previous imaging. On limited assessment no acute skeletal process. IMPRESSION: 1. Opacity at the RIGHT lung base and LEFT mid chest, similar to the prior study. Patient with bilateral lung masses. No definite acute process. Difficult to exclude superimposed pneumonia given pattern of disease. 2. Potentially diminished RIGHT pleural effusion. Electronically Signed   By: Zetta Bills M.D.   On: 02/26/2021 13:28   OCT, Retina - OU - Both Eyes  Result Date: 02/19/2021 Right Eye Quality was good. Scan locations included subfoveal. Central Foveal Thickness: 335. Progression has no prior data. Findings include abnormal foveal contour. Left Eye Quality was good. Scan locations included subfoveal. Central Foveal Thickness: 314. Progression has been stable. Findings include epiretinal membrane. Notes Improving OD, 1 week post vitrectomy membrane peel OS with minor epiretinal membrane in the temporal aspect of the macula, no foveal distortion  OCT, Retina - OU - Both Eyes  Result Date: 01/29/2021 Right Eye Quality was good. Scan locations included subfoveal. Central Foveal Thickness: 297. Progression has no prior data. Findings include epiretinal membrane. Left Eye Quality was good. Scan locations included subfoveal. Central Foveal Thickness: 314. Progression has no prior data. Findings include epiretinal membrane. Notes Severe retinal thickening of the temporal aspect of the fovea  and into the FAZ OD OS with minor epiretinal membrane in the temporal aspect of the macula,  no foveal distortion   Time Spent in minutes  30   Lala Lund M.D on 02/27/2021 at 10:55 AM  To page go to [email or web address]

## 2021-02-28 DIAGNOSIS — I480 Paroxysmal atrial fibrillation: Secondary | ICD-10-CM | POA: Diagnosis not present

## 2021-02-28 DIAGNOSIS — I441 Atrioventricular block, second degree: Secondary | ICD-10-CM | POA: Diagnosis not present

## 2021-02-28 DIAGNOSIS — I50810 Right heart failure, unspecified: Secondary | ICD-10-CM

## 2021-02-28 DIAGNOSIS — I350 Nonrheumatic aortic (valve) stenosis: Secondary | ICD-10-CM

## 2021-02-28 DIAGNOSIS — R001 Bradycardia, unspecified: Secondary | ICD-10-CM | POA: Diagnosis not present

## 2021-02-28 DIAGNOSIS — I5043 Acute on chronic combined systolic (congestive) and diastolic (congestive) heart failure: Secondary | ICD-10-CM

## 2021-02-28 DIAGNOSIS — R531 Weakness: Secondary | ICD-10-CM | POA: Diagnosis not present

## 2021-02-28 DIAGNOSIS — I2729 Other secondary pulmonary hypertension: Secondary | ICD-10-CM

## 2021-02-28 DIAGNOSIS — I493 Ventricular premature depolarization: Secondary | ICD-10-CM | POA: Diagnosis not present

## 2021-02-28 LAB — CBC WITH DIFFERENTIAL/PLATELET
Abs Immature Granulocytes: 0.02 10*3/uL (ref 0.00–0.07)
Basophils Absolute: 0 10*3/uL (ref 0.0–0.1)
Basophils Relative: 1 %
Eosinophils Absolute: 0.1 10*3/uL (ref 0.0–0.5)
Eosinophils Relative: 1 %
HCT: 34.8 % — ABNORMAL LOW (ref 39.0–52.0)
Hemoglobin: 10.6 g/dL — ABNORMAL LOW (ref 13.0–17.0)
Immature Granulocytes: 0 %
Lymphocytes Relative: 20 %
Lymphs Abs: 1.2 10*3/uL (ref 0.7–4.0)
MCH: 27.7 pg (ref 26.0–34.0)
MCHC: 30.5 g/dL (ref 30.0–36.0)
MCV: 91.1 fL (ref 80.0–100.0)
Monocytes Absolute: 0.9 10*3/uL (ref 0.1–1.0)
Monocytes Relative: 16 %
Neutro Abs: 3.5 10*3/uL (ref 1.7–7.7)
Neutrophils Relative %: 62 %
Platelets: 129 10*3/uL — ABNORMAL LOW (ref 150–400)
RBC: 3.82 MIL/uL — ABNORMAL LOW (ref 4.22–5.81)
RDW: 15.4 % (ref 11.5–15.5)
WBC: 5.7 10*3/uL (ref 4.0–10.5)
nRBC: 0 % (ref 0.0–0.2)

## 2021-02-28 LAB — COMPREHENSIVE METABOLIC PANEL
ALT: 13 U/L (ref 0–44)
AST: 18 U/L (ref 15–41)
Albumin: 2.9 g/dL — ABNORMAL LOW (ref 3.5–5.0)
Alkaline Phosphatase: 105 U/L (ref 38–126)
Anion gap: 8 (ref 5–15)
BUN: 18 mg/dL (ref 8–23)
CO2: 30 mmol/L (ref 22–32)
Calcium: 9.1 mg/dL (ref 8.9–10.3)
Chloride: 103 mmol/L (ref 98–111)
Creatinine, Ser: 1.43 mg/dL — ABNORMAL HIGH (ref 0.61–1.24)
GFR, Estimated: 50 mL/min — ABNORMAL LOW (ref >60)
Glucose, Bld: 128 mg/dL — ABNORMAL HIGH (ref 70–99)
Potassium: 3.6 mmol/L (ref 3.5–5.1)
Sodium: 141 mmol/L (ref 135–145)
Total Bilirubin: 0.7 mg/dL (ref 0.3–1.2)
Total Protein: 5.9 g/dL — ABNORMAL LOW (ref 6.5–8.1)

## 2021-02-28 LAB — MAGNESIUM: Magnesium: 2.2 mg/dL (ref 1.7–2.4)

## 2021-02-28 LAB — GLUCOSE, CAPILLARY
Glucose-Capillary: 103 mg/dL — ABNORMAL HIGH (ref 70–99)
Glucose-Capillary: 132 mg/dL — ABNORMAL HIGH (ref 70–99)
Glucose-Capillary: 172 mg/dL — ABNORMAL HIGH (ref 70–99)
Glucose-Capillary: 171 mg/dL — ABNORMAL HIGH (ref 70–99)

## 2021-02-28 LAB — BRAIN NATRIURETIC PEPTIDE: B Natriuretic Peptide: 1430.5 pg/mL — ABNORMAL HIGH (ref 0.0–100.0)

## 2021-02-28 MED ORDER — FUROSEMIDE 10 MG/ML IJ SOLN
60.0000 mg | Freq: Two times a day (BID) | INTRAMUSCULAR | Status: DC
  Administered 2021-02-28 – 2021-03-01 (×3): 60 mg via INTRAVENOUS
  Filled 2021-02-28 (×3): qty 6

## 2021-02-28 MED ORDER — ENSURE ENLIVE PO LIQD
237.0000 mL | Freq: Three times a day (TID) | ORAL | Status: DC
  Administered 2021-02-28 – 2021-03-03 (×7): 237 mL via ORAL
  Filled 2021-02-28: qty 237

## 2021-02-28 MED ORDER — ADULT MULTIVITAMIN W/MINERALS CH
1.0000 | ORAL_TABLET | Freq: Every day | ORAL | Status: DC
  Administered 2021-02-28 – 2021-03-03 (×4): 1 via ORAL
  Filled 2021-02-28 (×4): qty 1

## 2021-02-28 MED ORDER — HALOPERIDOL LACTATE 5 MG/ML IJ SOLN
2.0000 mg | Freq: Once | INTRAMUSCULAR | Status: AC
  Administered 2021-02-28: 2 mg via INTRAVENOUS
  Filled 2021-02-28: qty 1

## 2021-02-28 NOTE — Plan of Care (Signed)
  Problem: Education: Goal: Knowledge of General Education information will improve Description: Including pain rating scale, medication(s)/side effects and non-pharmacologic comfort measures Outcome: Progressing   Problem: Health Behavior/Discharge Planning: Goal: Ability to manage health-related needs will improve Outcome: Progressing   Problem: Clinical Measurements: Goal: Respiratory complications will improve Outcome: Progressing   
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
## 2021-02-28 NOTE — Progress Notes (Addendum)
Progress Note  Patient Name: Randall Wilkins Date of Encounter: 02/28/2021  [facility] Cardiologist: Pixie Casino, MD   Subjective   Feeling much better today. Breathing has improved.   Inpatient Medications    Scheduled Meds: . allopurinol  100 mg Oral Daily  . amLODipine  10 mg Oral Daily  . apixaban  2.5 mg Oral BID  . Chlorhexidine Gluconate Cloth  6 each Topical Daily  . furosemide  60 mg Intravenous BID  . insulin aspart  0-5 Units Subcutaneous QHS  . insulin aspart  0-9 Units Subcutaneous TID WC  . insulin glargine  20 Units Subcutaneous QHS  . levothyroxine  75 mcg Oral QAC breakfast  . potassium chloride  40 mEq Oral BID  . sodium chloride flush  10-40 mL Intracatheter Q12H  . sotorasib  960 mg Oral q morning  . tamsulosin  0.4 mg Oral Daily  . umeclidinium-vilanterol  1 puff Inhalation Daily  . vitamin B-12  1,000 mcg Oral Daily   Continuous Infusions:  PRN Meds: acetaminophen **OR** [DISCONTINUED] acetaminophen, albuterol, hydrALAZINE, [DISCONTINUED] ondansetron **OR** ondansetron (ZOFRAN) IV   Vital Signs    Vitals:   02/28/21 0200 02/28/21 0400 02/28/21 0820 02/28/21 0923  BP:  (!) 147/61  (!) 149/93  Pulse:  75  69  Resp:  15  20  Temp:  99.4 F (37.4 C)  97.6 F (36.4 C)  TempSrc:  Oral  Oral  SpO2: 97% 99% 98% 98%  Weight:  100.2 kg    Height:        Intake/Output Summary (Last 24 hours) at 02/28/2021 1042 Last data filed at 02/28/2021 0000 Gross per 24 hour  Intake 600 ml  Output 1150 ml  Net -550 ml   Last 3 Weights 02/28/2021 02/27/2021 02/26/2021  Weight (lbs) 220 lb 14.4 oz 222 lb 11.2 oz 222 lb  Weight (kg) 100.2 kg 101.016 kg 100.699 kg      Telemetry    Intermittent bradycardia with Mobitz 1, Freq PVCs - Personally Reviewed  ECG   No new tracing  Physical Exam   GEN: Chronically ill, older male, No acute distress.   Neck: No JVD Cardiac: RRR, + systolic murmur, no rubs, or gallops.  Respiratory: Clear to  auscultation bilaterally. GI: Soft, nontender, non-distended  MS: Trace bilateral LE edema; No deformity. Neuro:  Nonfocal  Psych: Normal affect   Labs    High Sensitivity Troponin:  No results for input(s): TROPONINIHS in the last 720 hours.    Chemistry Recent Labs  Lab 02/26/21 1221 02/27/21 0439 02/28/21 0416  NA 140 140 141  K 3.8 3.4* 3.6  CL 103 102 103  CO2 29 30 30   GLUCOSE 114* 116* 128*  BUN 22 19 18   CREATININE 1.46* 1.42* 1.43*  CALCIUM 8.9 9.0 9.1  PROT  --  6.3* 5.9*  ALBUMIN  --  3.0* 2.9*  AST  --  22 18  ALT  --  15 13  ALKPHOS  --  114 105  BILITOT  --  0.8 0.7  GFRNONAA 48* 50* 50*  ANIONGAP 8 8 8      Hematology Recent Labs  Lab 02/26/21 1221 02/27/21 0439 02/28/21 0416  WBC 5.6 6.0 5.7  RBC 4.20* 3.91* 3.82*  HGB 11.8* 11.0* 10.6*  HCT 39.5 36.6* 34.8*  MCV 94.0 93.6 91.1  MCH 28.1 28.1 27.7  MCHC 29.9* 30.1 30.5  RDW 15.3 15.3 15.4  PLT 131* 137* 129*    BNP Recent  Labs  Lab 02/27/21 0439 02/28/21 0417  BNP 1,168.2* 1,430.5*     DDimer No results for input(s): DDIMER in the last 168 hours.   Radiology    DG Chest 2 View  Result Date: 02/26/2021 CLINICAL DATA:  Weakness on home oxygen. EXAM: CHEST - 2 VIEW COMPARISON:  November 05, 2020 FINDINGS: RIGHT IJ Port-A-Cath in situ. Post median sternotomy for CABG. Heart size remains enlarged. Opacity at the RIGHT lung base and LEFT mid chest as on the prior study. Potentially diminished effusion in the RIGHT chest compared to previous imaging. On limited assessment no acute skeletal process. IMPRESSION: 1. Opacity at the RIGHT lung base and LEFT mid chest, similar to the prior study. Patient with bilateral lung masses. No definite acute process. Difficult to exclude superimposed pneumonia given pattern of disease. 2. Potentially diminished RIGHT pleural effusion. Electronically Signed   By: Zetta Bills M.D.   On: 02/26/2021 13:28   ECHOCARDIOGRAM COMPLETE  Result Date: 02/27/2021     ECHOCARDIOGRAM REPORT   Patient Name:   Randall Wilkins Date of Exam: 02/27/2021 Medical Rec #:  [id number]      Height:       67.0 in Accession #:    [id number]     Weight:       222.0 lb Date of Birth:  1941/03/02      BSA:          2.114 m Patient Age:    80 years       BP:           156/74 mmHg Patient Gender: M              HR:           57 bpm. Exam Location:  Inpatient Procedure: 2D Echo Indications:    dyspnea  History:        Patient has prior history of Echocardiogram examinations, most                 recent 03/22/2020. CAD, Abnormal ECG, Arrythmias:symptomatic                 bradycardia; Risk Factors:Diabetes, Dyslipidemia and Sleep                 Apnea.  Sonographer:    Johny Chess Referring Phys: Nikolai  1. Left ventricular ejection fraction, by estimation, is 40 to 45%. The left ventricle has mildly decreased function. The left ventricle demonstrates global hypokinesis. There is mild asymmetric left ventricular hypertrophy. Left ventricular diastolic parameters are indeterminate.  2. Right ventricular systolic function was not well visualized. The right ventricular size is not well visualized. There is severely elevated pulmonary artery systolic pressure. The estimated right ventricular systolic pressure is 59.5 mmHg.  3. Right atrial size was mildly dilated.  4. The mitral valve is grossly normal. Trivial mitral valve regurgitation.  5. The aortic valve is grossly normal. There is mild calcification of the aortic valve. There is mild thickening of the aortic valve. Aortic valve regurgitation is trivial. Mild aortic valve stenosis. FINDINGS  Left Ventricle: Left ventricular ejection fraction, by estimation, is 40 to 45%. The left ventricle has mildly decreased function. The left ventricle demonstrates global hypokinesis. The left ventricular internal cavity size was normal in size. There is  mild asymmetric left ventricular hypertrophy. Left ventricular diastolic  parameters are indeterminate. Right Ventricle: The right ventricular size is not well visualized. Right vetricular wall thickness was not  well visualized. Right ventricular systolic function was not well visualized. There is severely elevated pulmonary artery systolic pressure. The tricuspid regurgitant velocity is 4.33 m/s, and with an assumed right atrial pressure of 3 mmHg, the estimated right ventricular systolic pressure is 46.6 mmHg. Left Atrium: Left atrial size was normal in size. Right Atrium: Right atrial size was mildly dilated. Pericardium: There is no evidence of pericardial effusion. Mitral Valve: The mitral valve is grossly normal. Trivial mitral valve regurgitation. Tricuspid Valve: The tricuspid valve is normal in structure. Tricuspid valve regurgitation is mild. Aortic Valve: The aortic valve is grossly normal. There is mild calcification of the aortic valve. There is mild thickening of the aortic valve. Aortic valve regurgitation is trivial. Mild aortic stenosis is present. Aortic valve mean gradient measures 16.0 mmHg. Aortic valve peak gradient measures 30.3 mmHg. Aortic valve area, by VTI measures 0.89 cm. Pulmonic Valve: The pulmonic valve was normal in structure. Pulmonic valve regurgitation is not visualized. Aorta: The aortic root and ascending aorta are structurally normal, with no evidence of dilitation. IAS/Shunts: The atrial septum is grossly normal.  LEFT VENTRICLE PLAX 2D LVIDd:         5.10 cm LVIDs:         4.00 cm LV PW:         1.30 cm LV IVS:        1.10 cm LVOT diam:     2.00 cm LV SV:         54 LV SV Index:   25 LVOT Area:     3.14 cm  IVC IVC diam: 1.90 cm LEFT ATRIUM             Index       RIGHT ATRIUM           Index LA diam:        4.40 cm 2.08 cm/m  RA Area:     19.10 cm LA Vol (A2C):   67.3 ml 31.84 ml/m RA Volume:   49.50 ml  23.42 ml/m LA Vol (A4C):   64.0 ml 30.27 ml/m LA Biplane Vol: 67.1 ml 31.74 ml/m  AORTIC VALVE AV Area (Vmax):    0.93 cm AV Area  (Vmean):   0.95 cm AV Area (VTI):     0.89 cm AV Vmax:           275.33 cm/s AV Vmean:          180.667 cm/s AV VTI:            0.603 m AV Peak Grad:      30.3 mmHg AV Mean Grad:      16.0 mmHg LVOT Vmax:         81.77 cm/s LVOT Vmean:        54.900 cm/s LVOT VTI:          0.171 m LVOT/AV VTI ratio: 0.28  AORTA Ao Root diam: 3.00 cm Ao Asc diam:  3.30 cm MITRAL VALVE                TRICUSPID VALVE MV Area (PHT): 5.75 cm     TR Peak grad:   75.0 mmHg MV Decel Time: 132 msec     TR Vmax:        433.00 cm/s MV E velocity: 136.00 cm/s MV A velocity: 50.80 cm/s   SHUNTS MV E/A ratio:  2.68         Systemic VTI:  0.17 m  Systemic Diam: 2.00 cm Mertie Moores MD Electronically signed by Mertie Moores MD Signature Date/Time: 02/27/2021/1:20:59 PM    Final     Cardiac Studies   Echo: 02/27/21  IMPRESSIONS    1. Left ventricular ejection fraction, by estimation, is 40 to 45%. The  left ventricle has mildly decreased function. The left ventricle  demonstrates global hypokinesis. There is mild asymmetric left ventricular  hypertrophy. Left ventricular diastolic  parameters are indeterminate.  2. Right ventricular systolic function was not well visualized. The right  ventricular size is not well visualized. There is severely elevated  pulmonary artery systolic pressure. The estimated right ventricular  systolic pressure is 18.8 mmHg.  3. Right atrial size was mildly dilated.  4. The mitral valve is grossly normal. Trivial mitral valve  regurgitation.  5. The aortic valve is grossly normal. There is mild calcification of the  aortic valve. There is mild thickening of the aortic valve. Aortic valve  regurgitation is trivial. Mild aortic valve stenosis.   Patient Profile     80 y.o. 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 Candiss Norse.  Assessment & Plan    1. Bradycardia with intermittent mobitz 1: rates are now stable in the 60-70 range. -- metoprolol and Aricept held on admission with improvement  2. PVCs/Verticular Bigeminy: Still with freq PVCs but have improved.  -- low mag on admission, now improved -- unable to resume BB as above with bradycardia  3. Chronic combined CHF: Echo this admission with slight change in EF at 40-45% with global hypokinesis. BNP 1168 with mild LE edema on admission. -- started on IV lasix 60mg  BID yesterday with 1.1L UOP overnight --> he likely has at least 3 to 4 L nasal diuresis before we switch to oral diuretic need to closely monitor electrolytes especially magnesium calcium and potassium.- -- will continue with IV lasix through today and follow up response tomorrow  4. Paroxysmal Afib: maintaining SR  -- on Eliquis dose reduced 2.5mg  BID  5. Stage IV non-small cell lung Ca: followed by Dr. Earlie Server at the [facility]  6. Aortic Stenosis: noted at mild stenosis on echo -> murmur is well proportion to echo findings.  7. Hypomagnesemia: resolved  8. HTN: currently on amlodipine with borderline control -- options are limited, unable to resume BB and no ARB/ACE with elevated Cr -- may benefit from addition of hydralazine given CHF  For questions or updates, please contact [place] Please consult [email or web address] for contact info under      Signed, Reino Bellis, NP  02/28/2021, 10:42 AM    ATTENDING ATTESTATION  I have seen, examined and evaluated the patient this PM along with Reino Bellis, NP-C.  After reviewing all the available data and chart, we discussed the patients laboratory, study & physical findings as well as symptoms in detail. I agree with her findings, examination as well as impression recommendations as per our discussion.     Attending adjustments noted in italics.   He looks and feels much better.  He is eating well, no longer nauseated.  Heart rate is much better.  He still has PVCs with Wenckebach block -> but overall, as the rate comes up, this becomes less significant.  I still think he needs aggressive diuresis,  but need to closely follow his electrolytes.    Glenetta Hew, M.D., M.S. Interventional Cardiologist   Pager # [phone] Phone # [phone] [address]. [place], [place] [address]

## 2021-02-28 NOTE — Progress Notes (Signed)
Physical Therapy Treatment Patient Details Name: Randall Wilkins MRN: [id number] DOB: 11-05-1941 Today's Date: 02/28/2021    History of Present Illness Pt is an 80 y.o. male admitted 02/26/21 with generalized weakness, difficulty ambulating, and HHPT noted HR in 20s. Workup for bradycardia, afib, PVCs/ventricular bigeminy, CHF. PMH includes afib, HTN, mild AS, CAD s/p CABG, CVA, stage IV non-small cell lung CA (oral chemo), L foot drop.   PT Comments    Pt progressing well with mobility. Today's session focused on gait training and strengthening. Pt tolerated bouts of ambulation with RW and intermittent min guard for balance. SpO2 96% on 3L O2 [place], HR 60s. Pt's wife present and supportive; both pt and wife pleased with his improvements in mobility; wife able to provide necessary assist at home. Will continue to follow acutely.    Follow Up Recommendations  Home health PT;Supervision/Assistance - 24 hour     Equipment Recommendations  None recommended by PT    Recommendations for Other Services       Precautions / Restrictions Precautions Precautions: Fall;Other (comment) Precaution Comments: Chronic L foot drop, bradycardia Required Braces or Orthoses: Other Brace Other Brace: L AFO in room Restrictions Weight Bearing Restrictions: No    Mobility  Bed Mobility Overal bed mobility: Needs Assistance Bed Mobility: Supine to Sit     Supine to sit: Min assist     General bed mobility comments: MinA for BLE management (provided by wife); minA for HHA to elevate trunk    Transfers Overall transfer level: Needs assistance Equipment used: Rolling walker (2 wheeled) Transfers: Sit to/from Stand Sit to Stand: Min guard;Supervision         General transfer comment: Initial min guard for balance standing from EOB, increased effort without armrests; multiple additional sit<>stands from recliner, progressing to supervision for safety  Ambulation/Gait Ambulation/Gait assistance:  Min guard Gait Distance (Feet): 24 Feet (+48) Assistive device: Rolling walker (2 wheeled) Gait Pattern/deviations: Step-through pattern;Decreased stride length;Trunk flexed Gait velocity: Decreased   General Gait Details: Pt's AFO donned for ambulation with stability significantly improved; ambulated multiple laps in room with RW and min guard for safety; 1x seated rest break secondary to fatigue   Stairs             Wheelchair Mobility    Modified Rankin (Stroke Patients Only)       Balance Overall balance assessment: Needs assistance Sitting-balance support: No upper extremity supported;Feet supported Sitting balance-Leahy Scale: Fair       Standing balance-Leahy Scale: Poor Standing balance comment: Reliant on BUE support; assist for posterior pericare while standing                            Cognition Arousal/Alertness: Awake/alert Behavior During Therapy: WFL for tasks assessed/performed Overall Cognitive Status: History of cognitive impairments - at baseline                                 General Comments: Pt's wife reports pt has memory deficits at baseline. Pt interacting well and following simple commands appropriately, requiring some repetition due to [facility]. Decreased awareness      Exercises      General Comments General comments (skin integrity, edema, etc.): SpO2 96% on 3L O2 [place]. Pt's wife present and supportive; discussed d/c home with continued HHPT/OT services, wife reports no DME needs      Pertinent Vitals/Pain Pain  Assessment: No/denies pain    Home Living                      Prior Function            PT Goals (current goals can now be found in the care plan section) Progress towards PT goals: Progressing toward goals    Frequency    Min 3X/week      PT Plan Current plan remains appropriate    Co-evaluation              AM-PAC PT "6 Clicks" Mobility   Outcome Measure  Help needed  turning from your back to your side while in a flat bed without using bedrails?: A Little Help needed moving from lying on your back to sitting on the side of a flat bed without using bedrails?: A Little Help needed moving to and from a bed to a chair (including a wheelchair)?: A Little Help needed standing up from a chair using your arms (e.g., wheelchair or bedside chair)?: A Little Help needed to walk in hospital room?: A Little Help needed climbing 3-5 steps with a railing? : A Little 6 Click Score: 18    End of Session Equipment Utilized During Treatment: Gait belt;Other (comment) (pt's L AFO) Activity Tolerance: Patient tolerated treatment well Patient left: in chair;with call bell/phone within reach;with chair alarm set;with family/visitor present Nurse Communication: Mobility status PT Visit Diagnosis: Unsteadiness on feet (R26.81);Muscle weakness (generalized) (M62.81)     Time: 1021-1173 PT Time Calculation (min) (ACUTE ONLY): 25 min  Charges:  $Gait Training: 8-22 mins $Therapeutic Exercise: 8-22 mins                     Mabeline Caras, PT, DPT Acute Rehabilitation Services  Pager [phone] Office [place] 02/28/2021, 11:57 AM

## 2021-02-28 NOTE — Progress Notes (Signed)
Initial Nutrition Assessment  DOCUMENTATION CODES:   Obesity unspecified  INTERVENTION:  -Ensure Enlive TID, each supplement provides 350 kcal, 20 grams protein   -Snacks BID  -MVI with minerals po daily   NUTRITION DIAGNOSIS:   Increased nutrient needs related to chronic illness,cancer and cancer related treatments (stage 4 lung cancer, CHF, CKD3) as evidenced by estimated needs.  GOAL:   Patient will meet greater than or equal to 90% of their needs  MONITOR:   PO intake,Supplement acceptance,Skin,I & O's,Labs,Weight trends  REASON FOR ASSESSMENT:   Malnutrition Screening Tool    ASSESSMENT:   21 YOM admitted for symptomatic bradycardia. PMH of memory loss, rhabdomyolysis induced left leg muscular injury causing left leg weakness, insulin dependent DM, peripheral neuropathy, CAD s/p CABG x 2 (2016), HLD, gout, stroke, HTN, heart attack, CHF, CKD3, dysphagia, stage 4 non-small cell lung ca (2021) on daily oral chemo.  Per MD note, pt was feeling weak on day of admission with difficulty to ambulate and was brought to ER where sinus bradycardia was confirmed. Heart is improving, but pt is having PVCs. Notes mention that pt is feeling much better today, breathing has improved and pt is eating well with no nausea.   Per chart meal documentation, pt has been consuming 50% of meals. Spoke with pt's wife during time of visit while pt napped. Pt's wife reports that pt has had a fair appetite since being admitted and had been consuming 50% or more of his trays. Pt's wife reports that pt has had a fair appetite since starting chemo in October due to taste changes. She reports that pt's appetite has been improving since January when he switched chemotherapy. Pt's wife reports pt's usual intake is:  Breakfast - muffin and coffee  Lunch - ham sandwich with chips  Dinner - baked chicken or seafood, vegetables and potato  Snack - cookie or popsicle  Pt's wife mentioned that pt was  currently consuming 1-2 Ensures per day, but stopped drinking them due to her husband developing lactose intolerance. Intern discussed with her that Ensure Enlives are lactose intolerant and should be tolerated by her husband. She was open to having him try them again to aid in his healing and provide him with extra calories and protein.   Pt's wife reports a UBW or 260#, but reports she feels pt has lost weight in the past few months due to pt's cancer treatment. Pt's weights reviewed and show a 7% weight loss in 3 months which is not significant for time frame but is concerning.   Pt's wife reports that pt is mobile at home and uses a walker for assistance.   Meds reviewed: Lasix (BID), Klor-Con (BID), Sotorasib (daily), Cyanocobalamin (daily) Labs reviewed: Corrected Calcium (9.98), CBG (94-179)  NUTRITION - FOCUSED PHYSICAL EXAM:  Flowsheet Row Most Recent Value  Orbital Region No depletion  Upper Arm Region No depletion  Thoracic and Lumbar Region No depletion  Buccal Region No depletion  Temple Region No depletion  Clavicle Bone Region No depletion  Clavicle and Acromion Bone Region No depletion  Scapular Bone Region No depletion  Dorsal Hand No depletion  Patellar Region No depletion  Anterior Thigh Region No depletion  Posterior Calf Region No depletion  Edema (RD Assessment) Moderate  [bilateral lower extremities]  Hair Reviewed  Eyes Unable to assess  [Unable to assess due to pt sleeping]  Mouth Unable to assess  [Unable to assess due to pt sleeping]  Skin Reviewed  Nails Reviewed  Diet Order:   Diet Order            Diet Carb Modified Fluid consistency: Thin; Room service appropriate? Yes  Diet effective now                 EDUCATION NEEDS:   No education needs have been identified at this time  Skin:  Skin Assessment: Skin Integrity Issues: Skin Integrity Issues:: Other (Comment) Other: L arm skin tear  Last BM:  02/28/21  Height:   Ht Readings  from Last 1 Encounters:  02/27/21 5\' 7"  (1.702 m)    Weight:   Wt Readings from Last 1 Encounters:  02/28/21 100.2 kg    Ideal Body Weight:  67.3 kg  BMI:  Body mass index is 34.6 kg/m.  Estimated Nutritional Needs:   Kcal:  2150-2350  Protein:  120-135 g  Fluid:  2 L    Salvadore Oxford, Dietetic Intern 02/28/2021 2:03 PM

## 2021-02-28 NOTE — Progress Notes (Signed)
PROGRESS NOTE                                                                                                                                                                                                             Patient Demographics:    Randall Wilkins, is a 80 y.o. male, DOB - Nov 17, 1941, DTO:[id number]  Admit date - 02/26/2021   Admitting Physician Pratik Darleen Crocker, DO  Outpatient Primary MD for the patient is [name], MD  LOS - 1  Chief Complaint  Patient presents with  . Fatigue  . Bradycardia       Brief Narrative (HPI from H&P) Randall Wilkins  is a 80 y.o. 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 Curt Bears on daily oral chemotherapy, paroxysmal A. fib [place] vas 2 score of 6, CAD s/p CABG, stroke in the past, CKD 3 a baseline creatinine around 1.4, hypertension, dyslipidemia, DM type II, aortic valve stenosis, combined chronic systolic and diastolic heart failure last EF around 45%, left leg weakness after bout of severe rhabdomyolysis wears a brace, memory dysfunction placed on Aricept, he developed severe generalized weakness, difficulty to ambulate, home PT noticed that his heart rate was in high 20s, he came to the hospital where he was found to have sinus bradycardia along with signs of CHF and admitted to the hospital.   Subjective:   Patient in bed, appears comfortable, denies any headache, no fever, no chest pain or pressure, improved exertional shortness of breath , no abdominal pain. No focal weakness.     Assessment  & Plan :      1.  Symptomatic bradycardia.  This could be due to combination of beta-blocker use and recent Aricept use. His TSH is stable, holding offending medications, still extremely weak and in mild CHF, PT to evaluate, may require placement to SNF, currently not safe discharge.  Cardiology following.  2.  Paroxysmal A. fib with [place]  vas 2 score of 6. Continue Eliquis for anticoagulation, hold beta-blocker, cardiology on board heart rate improved after prednisone wean.  3.  CAD s/p CABG.  Currently chest pain-free with nonacute EKG, continue Eliquis hold beta-blocker.  4.  Aortic stenosis. TTE noted, mild aortic stenosis, cardiology following.  5. Acute on Chronic combined systolic and diastolic heart failure last EF around 45%. On IV lasix, no ACE/ARB/Entresto due to renal insufficiency.   6.  Rhabdomyolysis induced left leg muscular  injury causing left leg weakness.  Continue wearing brace when out of bed, PT OT.  7.  Previous history of stroke.  Supportive care.  Continue Eliquis.  8.  Stage IV non-small cell lung cancer -continue oral chemotherapy, Dr. Julien Nordmann has been informed.  9.  Obesity with OSA.  Follow with PCP for weight loss BMI 34, nighttime oxygen to be continued when he sleeps.  10.  CKD stage IIIa.  Creatinine appears to be close to baseline.  Will monitor.  11. Dehydration. Resolved, has been hydrated with IVF.  13.  BPH.  Continue home dose alpha-blocker.  14. Hypothyroidism.  stable TSH continue home dose Synthroid.  15.  Hypertension.  Placed on Norvasc along with as needed IV hydralazine.   16.  Early dementia.  Hold Aricept due to bradycardia, at risk for delirium, patient and husband both want that patient can get delirious while he is in the hospital, use Haldol if needed, minimize narcotics and benzodiazepines  17. Hypokalemia.  Replaced.    18. DM type II.  On Tresiba along with sliding scale to continue.  Lab Results  Component Value Date   HGBA1C 6.7 (H) 02/26/2021   CBG (last 3)  Recent Labs    02/27/21 1618 02/27/21 2108 02/28/21 0528  GLUCAP 144* 179* 103*   Lab Results  Component Value Date   TSH 1.184 02/26/2021      Condition - Extremely Guarded  Family Communication  :  Wife bedside 3/16, 3/17, 3/18  Code Status :  Full  Consults  :   Cards  Procedures  :    TTE - 1. Left ventricular ejection fraction, by estimation, is 40 to 45%. The left ventricle has mildly decreased function. The left ventricle demonstrates global hypokinesis. There is mild asymmetric left ventricular hypertrophy. Left ventricular diastolic  parameters are indeterminate.  2. Right ventricular systolic function was not well visualized. The right ventricular size is not well visualized. There is severely elevated pulmonary artery systolic pressure. The estimated right ventricular systolic pressure is 25.4 mmHg.  3. Right atrial size was mildly dilated.  4. The mitral valve is grossly normal. Trivial mitral valve regurgitation.  5. The aortic valve is grossly normal. There is mild calcification of the aortic valve. There is mild thickening of the aortic valve. Aortic valve regurgitation is trivial. Mild aortic valve stenosis.   PUD Prophylaxis : None  Disposition Plan  :    Status is: Observation  Dispo: The patient is from: Home              Anticipated d/c is to: TBD              Patient currently is not medically stable to d/c.   Difficult to place patient No  DVT Prophylaxis  :  Eliquis  Lab Results  Component Value Date   PLT 129 (L) 02/28/2021    Diet :  Diet Order            Diet Carb Modified Fluid consistency: Thin; Room service appropriate? Yes  Diet effective now                  Inpatient Medications Scheduled Meds: . allopurinol  100 mg Oral Daily  . amLODipine  10 mg Oral Daily  . apixaban  2.5 mg Oral BID  . Chlorhexidine Gluconate Cloth  6 each Topical Daily  . furosemide  60 mg Intravenous BID  . insulin aspart  0-5 Units Subcutaneous QHS  .  insulin aspart  0-9 Units Subcutaneous TID WC  . insulin glargine  20 Units Subcutaneous QHS  . levothyroxine  75 mcg Oral QAC breakfast  . potassium chloride  40 mEq Oral BID  . sodium chloride flush  10-40 mL Intracatheter Q12H  . sotorasib  960 mg Oral q morning  .  tamsulosin  0.4 mg Oral Daily  . umeclidinium-vilanterol  1 puff Inhalation Daily  . vitamin B-12  1,000 mcg Oral Daily   Continuous Infusions: PRN Meds:.acetaminophen **OR** [DISCONTINUED] acetaminophen, albuterol, hydrALAZINE, [DISCONTINUED] ondansetron **OR** ondansetron (ZOFRAN) IV  Antibiotics  :   Anti-infectives (From admission, onward)   None          Objective:   Vitals:   02/28/21 0010 02/28/21 0200 02/28/21 0400 02/28/21 0820  BP: (!) 155/82  (!) 147/61   Pulse: (!) 46  75   Resp: 20  15   Temp: 97.6 F (36.4 C)  99.4 F (37.4 C)   TempSrc: Oral  Oral   SpO2: 91% 97% 99% 98%  Weight:   100.2 kg   Height:        SpO2: 98 % O2 Flow Rate (L/min): 3 L/min  Wt Readings from Last 3 Encounters:  02/28/21 100.2 kg  02/18/21 102.7 kg  02/06/21 105.2 kg     Intake/Output Summary (Last 24 hours) at 02/28/2021 0845 Last data filed at 02/28/2021 0000 Gross per 24 hour  Intake 600 ml  Output 1150 ml  Net -550 ml     Physical Exam  Awake Alert, No new F.N deficits, Normal affect [place].AT,PERRAL Supple Neck,No JVD, No cervical lymphadenopathy appriciated.  Symmetrical Chest wall movement, Good air movement bilaterally, few rales RRR,No Gallops, +ve systolic murmur, No Parasternal Heave +ve B.Sounds, Abd Soft, No tenderness, No organomegaly appriciated, No rebound - guarding or rigidity. No Cyanosis, Clubbing or edema, No new Rash or bruise     Data Review:   Recent Labs  Lab 02/26/21 1221 02/27/21 0439 02/28/21 0416  WBC 5.6 6.0 5.7  HGB 11.8* 11.0* 10.6*  HCT 39.5 36.6* 34.8*  PLT 131* 137* 129*  MCV 94.0 93.6 91.1  MCH 28.1 28.1 27.7  MCHC 29.9* 30.1 30.5  RDW 15.3 15.3 15.4  LYMPHSABS  --  1.2 1.2  MONOABS  --  0.8 0.9  EOSABS  --  0.1 0.1  BASOSABS  --  0.1 0.0    Recent Labs  Lab 02/26/21 1221 02/26/21 1248 02/26/21 1250 02/26/21 1514 02/27/21 0439 02/28/21 0416 02/28/21 0417  NA 140  --   --   --  140 141  --   K 3.8  --   --    --  3.4* 3.6  --   CL 103  --   --   --  102 103  --   CO2 29  --   --   --  30 30  --   GLUCOSE 114*  --   --   --  116* 128*  --   BUN 22  --   --   --  19 18  --   CREATININE 1.46*  --   --   --  1.42* 1.43*  --   CALCIUM 8.9  --   --   --  9.0 9.1  --   AST  --   --   --   --  22 18  --   ALT  --   --   --   --  15 13  --   ALKPHOS  --   --   --   --  114 105  --   BILITOT  --   --   --   --  0.8 0.7  --   ALBUMIN  --   --   --   --  3.0* 2.9*  --   MG  --  1.1*  --   --  2.3 2.2  --   TSH  --   --  1.184  --   --   --   --   HGBA1C  --   --   --  6.7*  --   --   --   BNP  --   --   --   --  1,751.0*  --  1,430.5*    Recent Labs  Lab 02/27/21 0439 02/27/21 1942 02/28/21 0417  BNP 1,168.2*  --  1,430.5*  SARSCOV2NAA  --  NEGATIVE  --     ------------------------------------------------------------------------------------------------------------------ No results for input(s): CHOL, HDL, LDLCALC, TRIG, CHOLHDL, LDLDIRECT in the last 72 hours.  Lab Results  Component Value Date   HGBA1C 6.7 (H) 02/26/2021   ------------------------------------------------------------------------------------------------------------------ Recent Labs    02/26/21 1250  TSH 1.184   ------------------------------------------------------------------------------------------------------------------ No results for input(s): VITAMINB12, FOLATE, FERRITIN, TIBC, IRON, RETICCTPCT in the last 72 hours.  Coagulation profile No results for input(s): INR, PROTIME in the last 168 hours.  No results for input(s): DDIMER in the last 72 hours.  Cardiac Enzymes No results for input(s): CKMB, TROPONINI, MYOGLOBIN in the last 168 hours.  Invalid input(s): CK ------------------------------------------------------------------------------------------------------------------    Component Value Date/Time   BNP 1,430.5 (H) 02/28/2021 0417    Micro Results Recent Results (from the past 240 hour(s))   Respiratory (~20 pathogens) panel by PCR     Status: None   Collection Time: 02/26/21  4:20 PM   Specimen: Nasopharyngeal Swab; Respiratory  Result Value Ref Range Status   Adenovirus NOT DETECTED NOT DETECTED Final   Coronavirus 229E NOT DETECTED NOT DETECTED Final    Comment: (NOTE) The Coronavirus on the Respiratory Panel, DOES NOT test for the novel  Coronavirus (2019 nCoV)    Coronavirus HKU1 NOT DETECTED NOT DETECTED Final   Coronavirus NL63 NOT DETECTED NOT DETECTED Final   Coronavirus OC43 NOT DETECTED NOT DETECTED Final   Metapneumovirus NOT DETECTED NOT DETECTED Final   Rhinovirus / Enterovirus NOT DETECTED NOT DETECTED Final   Influenza A NOT DETECTED NOT DETECTED Final   Influenza B NOT DETECTED NOT DETECTED Final   Parainfluenza Virus 1 NOT DETECTED NOT DETECTED Final   Parainfluenza Virus 2 NOT DETECTED NOT DETECTED Final   Parainfluenza Virus 3 NOT DETECTED NOT DETECTED Final   Parainfluenza Virus 4 NOT DETECTED NOT DETECTED Final   Respiratory Syncytial Virus NOT DETECTED NOT DETECTED Final   Bordetella pertussis NOT DETECTED NOT DETECTED Final   Bordetella Parapertussis NOT DETECTED NOT DETECTED Final   Chlamydophila pneumoniae NOT DETECTED NOT DETECTED Final   Mycoplasma pneumoniae NOT DETECTED NOT DETECTED Final    Comment: Performed at [facility], [place]. [address], [place], [place] [address]  SARS CORONAVIRUS 2 (TAT 6-24 HRS) Nasopharyngeal Nasopharyngeal Swab     Status: None   Collection Time: 02/27/21  7:42 PM   Specimen: Nasopharyngeal Swab  Result Value Ref Range Status   SARS Coronavirus 2 NEGATIVE NEGATIVE Final    Comment: (NOTE) SARS-CoV-2 target nucleic acids are NOT DETECTED.  The SARS-CoV-2 RNA  is generally detectable in upper and lower respiratory specimens during the acute phase of infection. Negative results do not preclude SARS-CoV-2 infection, do not rule out co-infections with other pathogens, and should not be used as the sole  basis for treatment or other patient management decisions. Negative results must be combined with clinical observations, patient history, and epidemiological information. The expected result is Negative.  Fact Sheet for Patients: SugarRoll.be  Fact Sheet for Healthcare Providers: https://www.woods-mathews.com/  This test is not yet approved or cleared by the [place] FDA and  has been authorized for detection and/or diagnosis of SARS-CoV-2 by FDA under an Emergency Use Authorization (EUA). This EUA will remain  in effect (meaning this test can be used) for the duration of the COVID-19 declaration under Se ction 564(b)(1) of the Act, 21 U.S.C. section 360bbb-3(b)(1), unless the authorization is terminated or revoked sooner.  Performed at [facility], [place] [address] [place], [place] [address]     Radiology Reports DG Chest 2 View  Result Date: 02/26/2021 CLINICAL DATA:  Weakness on home oxygen. EXAM: CHEST - 2 VIEW COMPARISON:  November 05, 2020 FINDINGS: RIGHT IJ Port-A-Cath in situ. Post median sternotomy for CABG. Heart size remains enlarged. Opacity at the RIGHT lung base and LEFT mid chest as on the prior study. Potentially diminished effusion in the RIGHT chest compared to previous imaging. On limited assessment no acute skeletal process. IMPRESSION: 1. Opacity at the RIGHT lung base and LEFT mid chest, similar to the prior study. Patient with bilateral lung masses. No definite acute process. Difficult to exclude superimposed pneumonia given pattern of disease. 2. Potentially diminished RIGHT pleural effusion. Electronically Signed   By: Zetta Bills M.D.   On: 02/26/2021 13:28   ECHOCARDIOGRAM COMPLETE  Result Date: 02/27/2021    ECHOCARDIOGRAM REPORT   Patient Name:   Randall Wilkins Date of Exam: 02/27/2021 Medical Rec #:  [id number]      Height:       67.0 in Accession #:    [id number]     Weight:       222.0 lb Date of Birth:   1941-06-10      BSA:          2.114 m Patient Age:    72 years       BP:           156/74 mmHg Patient Gender: M              HR:           57 bpm. Exam Location:  Inpatient Procedure: 2D Echo Indications:    dyspnea  History:        Patient has prior history of Echocardiogram examinations, most                 recent 03/22/2020. CAD, Abnormal ECG, Arrythmias:symptomatic                 bradycardia; Risk Factors:Diabetes, Dyslipidemia and Sleep                 Apnea.  Sonographer:    Johny Chess Referring Phys: [place]  1. Left ventricular ejection fraction, by estimation, is 40 to 45%. The left ventricle has mildly decreased function. The left ventricle demonstrates global hypokinesis. There is mild asymmetric left ventricular hypertrophy. Left ventricular diastolic parameters are indeterminate.  2. Right ventricular systolic function was not well visualized. The right ventricular size is not well visualized.  There is severely elevated pulmonary artery systolic pressure. The estimated right ventricular systolic pressure is 32.3 mmHg.  3. Right atrial size was mildly dilated.  4. The mitral valve is grossly normal. Trivial mitral valve regurgitation.  5. The aortic valve is grossly normal. There is mild calcification of the aortic valve. There is mild thickening of the aortic valve. Aortic valve regurgitation is trivial. Mild aortic valve stenosis. FINDINGS  Left Ventricle: Left ventricular ejection fraction, by estimation, is 40 to 45%. The left ventricle has mildly decreased function. The left ventricle demonstrates global hypokinesis. The left ventricular internal cavity size was normal in size. There is  mild asymmetric left ventricular hypertrophy. Left ventricular diastolic parameters are indeterminate. Right Ventricle: The right ventricular size is not well visualized. Right vetricular wall thickness was not well visualized. Right ventricular systolic function was not well  visualized. There is severely elevated pulmonary artery systolic pressure. The tricuspid regurgitant velocity is 4.33 m/s, and with an assumed right atrial pressure of 3 mmHg, the estimated right ventricular systolic pressure is 55.7 mmHg. Left Atrium: Left atrial size was normal in size. Right Atrium: Right atrial size was mildly dilated. Pericardium: There is no evidence of pericardial effusion. Mitral Valve: The mitral valve is grossly normal. Trivial mitral valve regurgitation. Tricuspid Valve: The tricuspid valve is normal in structure. Tricuspid valve regurgitation is mild. Aortic Valve: The aortic valve is grossly normal. There is mild calcification of the aortic valve. There is mild thickening of the aortic valve. Aortic valve regurgitation is trivial. Mild aortic stenosis is present. Aortic valve mean gradient measures 16.0 mmHg. Aortic valve peak gradient measures 30.3 mmHg. Aortic valve area, by VTI measures 0.89 cm. Pulmonic Valve: The pulmonic valve was normal in structure. Pulmonic valve regurgitation is not visualized. Aorta: The aortic root and ascending aorta are structurally normal, with no evidence of dilitation. IAS/Shunts: The atrial septum is grossly normal.  LEFT VENTRICLE PLAX 2D LVIDd:         5.10 cm LVIDs:         4.00 cm LV PW:         1.30 cm LV IVS:        1.10 cm LVOT diam:     2.00 cm LV SV:         54 LV SV Index:   25 LVOT Area:     3.14 cm  IVC IVC diam: 1.90 cm LEFT ATRIUM             Index       RIGHT ATRIUM           Index LA diam:        4.40 cm 2.08 cm/m  RA Area:     19.10 cm LA Vol (A2C):   67.3 ml 31.84 ml/m RA Volume:   49.50 ml  23.42 ml/m LA Vol (A4C):   64.0 ml 30.27 ml/m LA Biplane Vol: 67.1 ml 31.74 ml/m  AORTIC VALVE AV Area (Vmax):    0.93 cm AV Area (Vmean):   0.95 cm AV Area (VTI):     0.89 cm AV Vmax:           275.33 cm/s AV Vmean:          180.667 cm/s AV VTI:            0.603 m AV Peak Grad:      30.3 mmHg AV Mean Grad:      16.0 mmHg LVOT Vmax:  81.77 cm/s LVOT Vmean:        54.900 cm/s LVOT VTI:          0.171 m LVOT/AV VTI ratio: 0.28  AORTA Ao Root diam: 3.00 cm Ao Asc diam:  3.30 cm MITRAL VALVE                TRICUSPID VALVE MV Area (PHT): 5.75 cm     TR Peak grad:   75.0 mmHg MV Decel Time: 132 msec     TR Vmax:        433.00 cm/s MV E velocity: 136.00 cm/s MV A velocity: 50.80 cm/s   SHUNTS MV E/A ratio:  2.68         Systemic VTI:  0.17 m                             Systemic Diam: 2.00 cm Mertie Moores MD Electronically signed by Mertie Moores MD Signature Date/Time: 02/27/2021/1:20:59 PM    Final    OCT, Retina - OU - Both Eyes  Result Date: 02/19/2021 Right Eye Quality was good. Scan locations included subfoveal. Central Foveal Thickness: 335. Progression has no prior data. Findings include abnormal foveal contour. Left Eye Quality was good. Scan locations included subfoveal. Central Foveal Thickness: 314. Progression has been stable. Findings include epiretinal membrane. Notes Improving OD, 1 week post vitrectomy membrane peel OS with minor epiretinal membrane in the temporal aspect of the macula, no foveal distortion  OCT, Retina - OU - Both Eyes  Result Date: 01/29/2021 Right Eye Quality was good. Scan locations included subfoveal. Central Foveal Thickness: 297. Progression has no prior data. Findings include epiretinal membrane. Left Eye Quality was good. Scan locations included subfoveal. Central Foveal Thickness: 314. Progression has no prior data. Findings include epiretinal membrane. Notes Severe retinal thickening of the temporal aspect of the fovea and into the FAZ OD OS with minor epiretinal membrane in the temporal aspect of the macula, no foveal distortion   Time Spent in minutes  30   Lala Lund M.D on 02/28/2021 at 8:45 AM  To page go to [email or web address]

## 2021-02-28 NOTE — Progress Notes (Signed)
Patient confused and trying to get out of bed without assistance, patient also removing clothing and telemetry electrodes, even pulled at his port. RN placed safety mittens and will continue to monitor.

## 2021-03-01 DIAGNOSIS — I493 Ventricular premature depolarization: Secondary | ICD-10-CM | POA: Diagnosis not present

## 2021-03-01 DIAGNOSIS — I441 Atrioventricular block, second degree: Secondary | ICD-10-CM | POA: Diagnosis not present

## 2021-03-01 DIAGNOSIS — R001 Bradycardia, unspecified: Secondary | ICD-10-CM | POA: Diagnosis not present

## 2021-03-01 DIAGNOSIS — I5043 Acute on chronic combined systolic (congestive) and diastolic (congestive) heart failure: Secondary | ICD-10-CM | POA: Diagnosis not present

## 2021-03-01 LAB — CBC WITH DIFFERENTIAL/PLATELET
Abs Immature Granulocytes: 0.01 10*3/uL (ref 0.00–0.07)
Basophils Absolute: 0 10*3/uL (ref 0.0–0.1)
Basophils Relative: 1 %
Eosinophils Absolute: 0.1 10*3/uL (ref 0.0–0.5)
Eosinophils Relative: 2 %
HCT: 38.3 % — ABNORMAL LOW (ref 39.0–52.0)
Hemoglobin: 11.7 g/dL — ABNORMAL LOW (ref 13.0–17.0)
Immature Granulocytes: 0 %
Lymphocytes Relative: 20 %
Lymphs Abs: 1.1 10*3/uL (ref 0.7–4.0)
MCH: 27.9 pg (ref 26.0–34.0)
MCHC: 30.5 g/dL (ref 30.0–36.0)
MCV: 91.2 fL (ref 80.0–100.0)
Monocytes Absolute: 0.9 10*3/uL (ref 0.1–1.0)
Monocytes Relative: 16 %
Neutro Abs: 3.4 10*3/uL (ref 1.7–7.7)
Neutrophils Relative %: 61 %
Platelets: 127 10*3/uL — ABNORMAL LOW (ref 150–400)
RBC: 4.2 MIL/uL — ABNORMAL LOW (ref 4.22–5.81)
RDW: 15.4 % (ref 11.5–15.5)
WBC: 5.6 10*3/uL (ref 4.0–10.5)
nRBC: 0 % (ref 0.0–0.2)

## 2021-03-01 LAB — COMPREHENSIVE METABOLIC PANEL
ALT: 11 U/L (ref 0–44)
AST: 25 U/L (ref 15–41)
Albumin: 2.9 g/dL — ABNORMAL LOW (ref 3.5–5.0)
Alkaline Phosphatase: 110 U/L (ref 38–126)
Anion gap: 8 (ref 5–15)
BUN: 18 mg/dL (ref 8–23)
CO2: 30 mmol/L (ref 22–32)
Calcium: 9 mg/dL (ref 8.9–10.3)
Chloride: 102 mmol/L (ref 98–111)
Creatinine, Ser: 1.54 mg/dL — ABNORMAL HIGH (ref 0.61–1.24)
GFR, Estimated: 45 mL/min — ABNORMAL LOW (ref >60)
Glucose, Bld: 105 mg/dL — ABNORMAL HIGH (ref 70–99)
Potassium: 3.7 mmol/L (ref 3.5–5.1)
Sodium: 140 mmol/L (ref 135–145)
Total Bilirubin: 1.4 mg/dL — ABNORMAL HIGH (ref 0.3–1.2)
Total Protein: 5.9 g/dL — ABNORMAL LOW (ref 6.5–8.1)

## 2021-03-01 LAB — BRAIN NATRIURETIC PEPTIDE: B Natriuretic Peptide: 1117.2 pg/mL — ABNORMAL HIGH (ref 0.0–100.0)

## 2021-03-01 LAB — GLUCOSE, CAPILLARY
Glucose-Capillary: 108 mg/dL — ABNORMAL HIGH (ref 70–99)
Glucose-Capillary: 267 mg/dL — ABNORMAL HIGH (ref 70–99)
Glucose-Capillary: 269 mg/dL — ABNORMAL HIGH (ref 70–99)
Glucose-Capillary: 174 mg/dL — ABNORMAL HIGH (ref 70–99)

## 2021-03-01 LAB — MAGNESIUM: Magnesium: 2.1 mg/dL (ref 1.7–2.4)

## 2021-03-01 NOTE — Progress Notes (Signed)
Progress Note  Patient Name: Randall Wilkins Date of Encounter: 03/01/2021  Primary Cardiologist: Pixie Casino, MD   Subjective   No chest pain or sob.   Inpatient Medications    Scheduled Meds: . allopurinol  100 mg Oral Daily  . amLODipine  10 mg Oral Daily  . apixaban  2.5 mg Oral BID  . Chlorhexidine Gluconate Cloth  6 each Topical Daily  . feeding supplement  237 mL Oral TID BM  . insulin aspart  0-5 Units Subcutaneous QHS  . insulin aspart  0-9 Units Subcutaneous TID WC  . insulin glargine  20 Units Subcutaneous QHS  . levothyroxine  75 mcg Oral QAC breakfast  . multivitamin with minerals  1 tablet Oral Daily  . potassium chloride  40 mEq Oral BID  . sodium chloride flush  10-40 mL Intracatheter Q12H  . sotorasib  960 mg Oral q morning  . tamsulosin  0.4 mg Oral Daily  . umeclidinium-vilanterol  1 puff Inhalation Daily  . vitamin B-12  1,000 mcg Oral Daily   Continuous Infusions:  PRN Meds: acetaminophen **OR** [DISCONTINUED] acetaminophen, albuterol, hydrALAZINE, [DISCONTINUED] ondansetron **OR** ondansetron (ZOFRAN) IV   Vital Signs    Vitals:   03/01/21 0713 03/01/21 0730 03/01/21 0859 03/01/21 1038  BP: (!) 159/75  (!) 124/54 (!) 139/108  Pulse: (!) 38  90   Resp: 20  13   Temp: 98.8 F (37.1 C)     TempSrc: Oral     SpO2: 100% 100% 100%   Weight:      Height:        Intake/Output Summary (Last 24 hours) at 03/01/2021 1159 Last data filed at 03/01/2021 0945 Gross per 24 hour  Intake 760 ml  Output 3400 ml  Net -2640 ml   Filed Weights   02/27/21 1245 02/28/21 0400 03/01/21 0347  Weight: 101 kg 100.2 kg 98.9 kg    Telemetry    NSR with AVWB - Personally Reviewed  ECG    none - Personally Reviewed  Physical Exam   GEN: No acute distress.   Neck: No JVD Cardiac: IRRR, no murmurs, rubs, or gallops.  Respiratory: Clear to auscultation bilaterally except for rales in the bases. GI: Soft, nontender, non-distended  MS: No edema; No  deformity. Neuro:  Nonfocal  Psych: Normal affect   Labs    Chemistry Recent Labs  Lab 02/27/21 0439 02/28/21 0416 03/01/21 0302  NA 140 141 140  K 3.4* 3.6 3.7  CL 102 103 102  CO2 30 30 30   GLUCOSE 116* 128* 105*  BUN 19 18 18   CREATININE 1.42* 1.43* 1.54*  CALCIUM 9.0 9.1 9.0  PROT 6.3* 5.9* 5.9*  ALBUMIN 3.0* 2.9* 2.9*  AST 22 18 25   ALT 15 13 11   ALKPHOS 114 105 110  BILITOT 0.8 0.7 1.4*  GFRNONAA 50* 50* 45*  ANIONGAP 8 8 8      Hematology Recent Labs  Lab 02/27/21 0439 02/28/21 0416 03/01/21 0302  WBC 6.0 5.7 5.6  RBC 3.91* 3.82* 4.20*  HGB 11.0* 10.6* 11.7*  HCT 36.6* 34.8* 38.3*  MCV 93.6 91.1 91.2  MCH 28.1 27.7 27.9  MCHC 30.1 30.5 30.5  RDW 15.3 15.4 15.4  PLT 137* 129* 127*    Cardiac EnzymesNo results for input(s): TROPONINI in the last 168 hours. No results for input(s): TROPIPOC in the last 168 hours.   BNP Recent Labs  Lab 02/27/21 0439 02/28/21 0417 03/01/21 0302  BNP 1,168.2* 1,430.5* 1,117.2*  DDimer No results for input(s): DDIMER in the last 168 hours.   Radiology    ECHOCARDIOGRAM COMPLETE  Result Date: 02/27/2021    ECHOCARDIOGRAM REPORT   Patient Name:   Randall Wilkins Date of Exam: 02/27/2021 Medical Rec #:  [id number]      Height:       67.0 in Accession #:    [id number]     Weight:       222.0 lb Date of Birth:  01-08-1941      BSA:          2.114 m Patient Age:    42 years       BP:           156/74 mmHg Patient Gender: M              HR:           57 bpm. Exam Location:  Inpatient Procedure: 2D Echo Indications:    dyspnea  History:        Patient has prior history of Echocardiogram examinations, most                 recent 03/22/2020. CAD, Abnormal ECG, Arrythmias:symptomatic                 bradycardia; Risk Factors:Diabetes, Dyslipidemia and Sleep                 Apnea.  Sonographer:    Johny Chess Referring Phys: [place]  1. Left ventricular ejection fraction, by estimation, is 40 to 45%.  The left ventricle has mildly decreased function. The left ventricle demonstrates global hypokinesis. There is mild asymmetric left ventricular hypertrophy. Left ventricular diastolic parameters are indeterminate.  2. Right ventricular systolic function was not well visualized. The right ventricular size is not well visualized. There is severely elevated pulmonary artery systolic pressure. The estimated right ventricular systolic pressure is 67.1 mmHg.  3. Right atrial size was mildly dilated.  4. The mitral valve is grossly normal. Trivial mitral valve regurgitation.  5. The aortic valve is grossly normal. There is mild calcification of the aortic valve. There is mild thickening of the aortic valve. Aortic valve regurgitation is trivial. Mild aortic valve stenosis. FINDINGS  Left Ventricle: Left ventricular ejection fraction, by estimation, is 40 to 45%. The left ventricle has mildly decreased function. The left ventricle demonstrates global hypokinesis. The left ventricular internal cavity size was normal in size. There is  mild asymmetric left ventricular hypertrophy. Left ventricular diastolic parameters are indeterminate. Right Ventricle: The right ventricular size is not well visualized. Right vetricular wall thickness was not well visualized. Right ventricular systolic function was not well visualized. There is severely elevated pulmonary artery systolic pressure. The tricuspid regurgitant velocity is 4.33 m/s, and with an assumed right atrial pressure of 3 mmHg, the estimated right ventricular systolic pressure is 24.5 mmHg. Left Atrium: Left atrial size was normal in size. Right Atrium: Right atrial size was mildly dilated. Pericardium: There is no evidence of pericardial effusion. Mitral Valve: The mitral valve is grossly normal. Trivial mitral valve regurgitation. Tricuspid Valve: The tricuspid valve is normal in structure. Tricuspid valve regurgitation is mild. Aortic Valve: The aortic valve is grossly  normal. There is mild calcification of the aortic valve. There is mild thickening of the aortic valve. Aortic valve regurgitation is trivial. Mild aortic stenosis is present. Aortic valve mean gradient measures 16.0 mmHg. Aortic valve peak gradient measures 30.3 mmHg. Aortic valve  area, by VTI measures 0.89 cm. Pulmonic Valve: The pulmonic valve was normal in structure. Pulmonic valve regurgitation is not visualized. Aorta: The aortic root and ascending aorta are structurally normal, with no evidence of dilitation. IAS/Shunts: The atrial septum is grossly normal.  LEFT VENTRICLE PLAX 2D LVIDd:         5.10 cm LVIDs:         4.00 cm LV PW:         1.30 cm LV IVS:        1.10 cm LVOT diam:     2.00 cm LV SV:         54 LV SV Index:   25 LVOT Area:     3.14 cm  IVC IVC diam: 1.90 cm LEFT ATRIUM             Index       RIGHT ATRIUM           Index LA diam:        4.40 cm 2.08 cm/m  RA Area:     19.10 cm LA Vol (A2C):   67.3 ml 31.84 ml/m RA Volume:   49.50 ml  23.42 ml/m LA Vol (A4C):   64.0 ml 30.27 ml/m LA Biplane Vol: 67.1 ml 31.74 ml/m  AORTIC VALVE AV Area (Vmax):    0.93 cm AV Area (Vmean):   0.95 cm AV Area (VTI):     0.89 cm AV Vmax:           275.33 cm/s AV Vmean:          180.667 cm/s AV VTI:            0.603 m AV Peak Grad:      30.3 mmHg AV Mean Grad:      16.0 mmHg LVOT Vmax:         81.77 cm/s LVOT Vmean:        54.900 cm/s LVOT VTI:          0.171 m LVOT/AV VTI ratio: 0.28  AORTA Ao Root diam: 3.00 cm Ao Asc diam:  3.30 cm MITRAL VALVE                TRICUSPID VALVE MV Area (PHT): 5.75 cm     TR Peak grad:   75.0 mmHg MV Decel Time: 132 msec     TR Vmax:        433.00 cm/s MV E velocity: 136.00 cm/s MV A velocity: 50.80 cm/s   SHUNTS MV E/A ratio:  2.68         Systemic VTI:  0.17 m                             Systemic Diam: 2.00 cm Mertie Moores MD Electronically signed by Mertie Moores MD Signature Date/Time: 02/27/2021/1:20:59 PM    Final     Cardiac Studies   none  Patient Profile      80 y.o. male admitted with worsening CHF and conduction system disease.   Assessment & Plan    1. Acute on chronic systolic and diastolic heart failure -he has diuresed about 5 lbs. We will continue iv lasix although his creatinine up slightly. 2. AV block - he is still having AVWB but much less. His AV nodal blocking drugs are stopped. 3. PVC's - these are asymptomatic.  4. AS - this is not critical on exam. We will follow.  5.  HTN - his bp is controlled. We will follow.     For questions or updates, please contact [place] Please consult [email or web address] for contact info under Cardiology/STEMI.      Signed, Cristopher Peru, MD  03/01/2021, 11:59 AM  Patient ID: Harrison S Wilkins, male   DOB: 1941-11-27, 80 y.o.   MRN: [id number]

## 2021-03-01 NOTE — Progress Notes (Signed)
PROGRESS NOTE                                                                                                                                                                                                             Patient Demographics:    Randall Wilkins, is a 80 y.o. male, DOB - 06-Jan-1941, NKN:[id number]  Admit date - 02/26/2021   Admitting Physician Pratik Darleen Crocker, DO  Outpatient Primary MD for the patient is [name], MD  LOS - 2  Chief Complaint  Patient presents with  . Fatigue  . Bradycardia       Brief Narrative (HPI from H&P) Randall Wilkins  is a 80 y.o. 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 Curt Bears on daily oral chemotherapy, paroxysmal A. fib [place] vas 2 score of 6, CAD s/p CABG, stroke in the past, CKD 3 a baseline creatinine around 1.4, hypertension, dyslipidemia, DM type II, aortic valve stenosis, combined chronic systolic and diastolic heart failure last EF around 45%, left leg weakness after bout of severe rhabdomyolysis wears a brace, memory dysfunction placed on Aricept, he developed severe generalized weakness, difficulty to ambulate, home PT noticed that his heart rate was in high 20s, he came to the hospital where he was found to have sinus bradycardia along with signs of CHF and admitted to the hospital.   Subjective:   Patient in recliner, appears comfortable, denies any headache, no fever, no chest pain or pressure, no shortness of breath , no abdominal pain. No focal weakness.   Assessment  & Plan :      1.  Symptomatic bradycardia.  This could be due to combination of beta-blocker use and recent Aricept use. His TSH is stable, holding offending medications, still extremely weak and in mild CHF, PT to evaluate, may require placement to SNF, currently not safe discharge.  Cardiology following.  2.  Paroxysmal A. fib with [place] vas 2 score of 6.  Continue Eliquis for anticoagulation, hold beta-blocker, cardiology on board heart rate improved after prednisone wean.  3.  CAD s/p CABG.  Currently chest pain-free with nonacute EKG, continue Eliquis hold beta-blocker.  4.  Aortic stenosis. TTE noted, mild aortic stenosis, cardiology following.  5. Acute on Chronic combined systolic and diastolic heart failure last EF around 45%. On IV lasix as tolerated, no ACE/ARB/Entresto due to renal insufficiency.  Holding PM Lasix on 03/01/2021 as creatinine has bumped.  If better likely discharge home on 03/03/2019.   6.  Rhabdomyolysis induced left leg muscular injury causing left leg weakness.  Continue wearing brace when out of bed, PT OT.  7.  Previous history of stroke.  Supportive care.  Continue Eliquis.  8.  Stage IV non-small cell lung cancer -continue oral chemotherapy, Dr. Julien Nordmann has been informed.  9.  Obesity with OSA.  Follow with PCP for weight loss BMI 34, nighttime oxygen to be continued when he sleeps.  10.  CKD stage IIIa.  Creatinine appears to be close to baseline.  Will monitor.  11. Dehydration. Resolved, has been hydrated with IVF.  13.  BPH.  Continue home dose alpha-blocker.  14. Hypothyroidism.  stable TSH continue home dose Synthroid.  15.  Hypertension.  Placed on Norvasc along with as needed IV hydralazine.   16.  Early dementia.  Hold Aricept due to bradycardia, at risk for delirium, patient and husband both want that patient can get delirious while he is in the hospital, use Haldol if needed, minimize narcotics and benzodiazepines  17. Hypokalemia.  Replaced.    18. DM type II.  On Tresiba along with sliding scale to continue.  Lab Results  Component Value Date   HGBA1C 6.7 (H) 02/26/2021   CBG (last 3)  Recent Labs    02/28/21 1625 02/28/21 2111 03/01/21 0755  GLUCAP 172* 171* 108*   Lab Results  Component Value Date   TSH 1.184 02/26/2021      Condition - Extremely  Guarded  Family Communication  :  Wife bedside 3/16, 3/17, 3/18  Code Status :  Full  Consults  :  Cards  Procedures  :    TTE - 1. Left ventricular ejection fraction, by estimation, is 40 to 45%. The left ventricle has mildly decreased function. The left ventricle demonstrates global hypokinesis. There is mild asymmetric left ventricular hypertrophy. Left ventricular diastolic  parameters are indeterminate.  2. Right ventricular systolic function was not well visualized. The right ventricular size is not well visualized. There is severely elevated pulmonary artery systolic pressure. The estimated right ventricular systolic pressure is 71.0 mmHg.  3. Right atrial size was mildly dilated.  4. The mitral valve is grossly normal. Trivial mitral valve regurgitation.  5. The aortic valve is grossly normal. There is mild calcification of the aortic valve. There is mild thickening of the aortic valve. Aortic valve regurgitation is trivial. Mild aortic valve stenosis.   PUD Prophylaxis : None  Disposition Plan  : Likely discharge 02/02/2021  Status is: Inpt  Dispo: The patient is from: Home              Anticipated d/c is to: TBD              Patient currently is not medically stable to d/c.   Difficult to place patient No  DVT Prophylaxis  :  Eliquis  Lab Results  Component Value Date   PLT 127 (L) 03/01/2021    Diet :  Diet Order            Diet Carb Modified Fluid consistency: Thin; Room service appropriate? Yes  Diet effective now                  Inpatient Medications Scheduled Meds: . allopurinol  100 mg Oral Daily  . amLODipine  10 mg Oral Daily  . apixaban  2.5 mg Oral BID  . Chlorhexidine Gluconate Cloth  6 each Topical Daily  .  feeding supplement  237 mL Oral TID BM  . insulin aspart  0-5 Units Subcutaneous QHS  . insulin aspart  0-9 Units Subcutaneous TID WC  . insulin glargine  20 Units Subcutaneous QHS  . levothyroxine  75 mcg Oral QAC breakfast  .  multivitamin with minerals  1 tablet Oral Daily  . potassium chloride  40 mEq Oral BID  . sodium chloride flush  10-40 mL Intracatheter Q12H  . sotorasib  960 mg Oral q morning  . tamsulosin  0.4 mg Oral Daily  . umeclidinium-vilanterol  1 puff Inhalation Daily  . vitamin B-12  1,000 mcg Oral Daily   Continuous Infusions: PRN Meds:.acetaminophen **OR** [DISCONTINUED] acetaminophen, albuterol, hydrALAZINE, [DISCONTINUED] ondansetron **OR** ondansetron (ZOFRAN) IV  Antibiotics  :   Anti-infectives (From admission, onward)   None          Objective:   Vitals:   03/01/21 0600 03/01/21 0713 03/01/21 0730 03/01/21 0859  BP:  (!) 159/75  (!) 124/54  Pulse: 94 (!) 38  90  Resp: 17 20  13   Temp:  98.8 F (37.1 C)    TempSrc:  Oral    SpO2: 97% 100% 100% 100%  Weight:      Height:        SpO2: 100 % O2 Flow Rate (L/min): 2 L/min  Wt Readings from Last 3 Encounters:  03/01/21 98.9 kg  02/18/21 102.7 kg  02/06/21 105.2 kg     Intake/Output Summary (Last 24 hours) at 03/01/2021 0957 Last data filed at 03/01/2021 0440 Gross per 24 hour  Intake 750 ml  Output 3400 ml  Net -2650 ml     Physical Exam  Awake Alert, No new F.N deficits, Normal affect [place].AT,PERRAL Supple Neck,No JVD, No cervical lymphadenopathy appriciated.  Symmetrical Chest wall movement, Good air movement bilaterally, few rales RRR,No Gallops, +ve systolic Murmur, No Parasternal Heave +ve B.Sounds, Abd Soft, No tenderness, No organomegaly appriciated, No rebound - guarding or rigidity. No Cyanosis, Clubbing or edema, No new Rash or bruise    Data Review:   Recent Labs  Lab 02/26/21 1221 02/27/21 0439 02/28/21 0416 03/01/21 0302  WBC 5.6 6.0 5.7 5.6  HGB 11.8* 11.0* 10.6* 11.7*  HCT 39.5 36.6* 34.8* 38.3*  PLT 131* 137* 129* 127*  MCV 94.0 93.6 91.1 91.2  MCH 28.1 28.1 27.7 27.9  MCHC 29.9* 30.1 30.5 30.5  RDW 15.3 15.3 15.4 15.4  LYMPHSABS  --  1.2 1.2 1.1  MONOABS  --  0.8 0.9 0.9   EOSABS  --  0.1 0.1 0.1  BASOSABS  --  0.1 0.0 0.0    Recent Labs  Lab 02/26/21 1221 02/26/21 1248 02/26/21 1250 02/26/21 1514 02/27/21 0439 02/28/21 0416 02/28/21 0417 03/01/21 0302  NA 140  --   --   --  140 141  --  140  K 3.8  --   --   --  3.4* 3.6  --  3.7  CL 103  --   --   --  102 103  --  102  CO2 29  --   --   --  30 30  --  30  GLUCOSE 114*  --   --   --  116* 128*  --  105*  BUN 22  --   --   --  19 18  --  18  CREATININE 1.46*  --   --   --  1.42* 1.43*  --  1.54*  CALCIUM 8.9  --   --   --  9.0 9.1  --  9.0  AST  --   --   --   --  22 18  --  25  ALT  --   --   --   --  15 13  --  11  ALKPHOS  --   --   --   --  114 105  --  110  BILITOT  --   --   --   --  0.8 0.7  --  1.4*  ALBUMIN  --   --   --   --  3.0* 2.9*  --  2.9*  MG  --  1.1*  --   --  2.3 2.2  --  2.1  TSH  --   --  1.184  --   --   --   --   --   HGBA1C  --   --   --  6.7*  --   --   --   --   BNP  --   --   --   --  3,382.5*  --  1,430.5* 1,117.2*    Recent Labs  Lab 02/27/21 0439 02/27/21 1942 02/28/21 0417 03/01/21 0302  BNP 1,168.2*  --  1,430.5* 1,117.2*  SARSCOV2NAA  --  NEGATIVE  --   --     ------------------------------------------------------------------------------------------------------------------ No results for input(s): CHOL, HDL, LDLCALC, TRIG, CHOLHDL, LDLDIRECT in the last 72 hours.  Lab Results  Component Value Date   HGBA1C 6.7 (H) 02/26/2021   ------------------------------------------------------------------------------------------------------------------ Recent Labs    02/26/21 1250  TSH 1.184   ------------------------------------------------------------------------------------------------------------------ No results for input(s): VITAMINB12, FOLATE, FERRITIN, TIBC, IRON, RETICCTPCT in the last 72 hours.  Coagulation profile No results for input(s): INR, PROTIME in the last 168 hours.  No results for input(s): DDIMER in the last 72 hours.  Cardiac  Enzymes No results for input(s): CKMB, TROPONINI, MYOGLOBIN in the last 168 hours.  Invalid input(s): CK ------------------------------------------------------------------------------------------------------------------    Component Value Date/Time   BNP 1,117.2 (H) 03/01/2021 0302    Micro Results Recent Results (from the past 240 hour(s))  Respiratory (~20 pathogens) panel by PCR     Status: None   Collection Time: 02/26/21  4:20 PM   Specimen: Nasopharyngeal Swab; Respiratory  Result Value Ref Range Status   Adenovirus NOT DETECTED NOT DETECTED Final   Coronavirus 229E NOT DETECTED NOT DETECTED Final    Comment: (NOTE) The Coronavirus on the Respiratory Panel, DOES NOT test for the novel  Coronavirus (2019 nCoV)    Coronavirus HKU1 NOT DETECTED NOT DETECTED Final   Coronavirus NL63 NOT DETECTED NOT DETECTED Final   Coronavirus OC43 NOT DETECTED NOT DETECTED Final   Metapneumovirus NOT DETECTED NOT DETECTED Final   Rhinovirus / Enterovirus NOT DETECTED NOT DETECTED Final   Influenza A NOT DETECTED NOT DETECTED Final   Influenza B NOT DETECTED NOT DETECTED Final   Parainfluenza Virus 1 NOT DETECTED NOT DETECTED Final   Parainfluenza Virus 2 NOT DETECTED NOT DETECTED Final   Parainfluenza Virus 3 NOT DETECTED NOT DETECTED Final   Parainfluenza Virus 4 NOT DETECTED NOT DETECTED Final   Respiratory Syncytial Virus NOT DETECTED NOT DETECTED Final   Bordetella pertussis NOT DETECTED NOT DETECTED Final   Bordetella Parapertussis NOT DETECTED NOT DETECTED Final   Chlamydophila pneumoniae NOT DETECTED NOT DETECTED Final   Mycoplasma pneumoniae NOT DETECTED NOT DETECTED Final    Comment: Performed at [facility], [place]. [address] [place], [place] [address]  SARS CORONAVIRUS 2 (TAT 6-24 HRS) Nasopharyngeal Nasopharyngeal Swab     Status: None   Collection Time: 02/27/21  7:42 PM   Specimen: Nasopharyngeal Swab  Result Value Ref Range Status   SARS Coronavirus 2 NEGATIVE  NEGATIVE Final    Comment: (NOTE) SARS-CoV-2 target nucleic acids are NOT DETECTED.  The SARS-CoV-2 RNA is generally detectable in upper and lower respiratory specimens during the acute phase of infection. Negative results do not preclude SARS-CoV-2 infection, do not rule out co-infections with other pathogens, and should not be used as the sole basis for treatment or other patient management decisions. Negative results must be combined with clinical observations, patient history, and epidemiological information. The expected result is Negative.  Fact Sheet for Patients: SugarRoll.be  Fact Sheet for Healthcare Providers: https://www.woods-mathews.com/  This test is not yet approved or cleared by the [place] FDA and  has been authorized for detection and/or diagnosis of SARS-CoV-2 by FDA under an Emergency Use Authorization (EUA). This EUA will remain  in effect (meaning this test can be used) for the duration of the COVID-19 declaration under Se ction 564(b)(1) of the Act, 21 U.S.C. section 360bbb-3(b)(1), unless the authorization is terminated or revoked sooner.  Performed at [facility], [place] [address], [place], [place] [address]     Radiology Reports DG Chest 2 View  Result Date: 02/26/2021 CLINICAL DATA:  Weakness on home oxygen. EXAM: CHEST - 2 VIEW COMPARISON:  November 05, 2020 FINDINGS: RIGHT IJ Port-A-Cath in situ. Post median sternotomy for CABG. Heart size remains enlarged. Opacity at the RIGHT lung base and LEFT mid chest as on the prior study. Potentially diminished effusion in the RIGHT chest compared to previous imaging. On limited assessment no acute skeletal process. IMPRESSION: 1. Opacity at the RIGHT lung base and LEFT mid chest, similar to the prior study. Patient with bilateral lung masses. No definite acute process. Difficult to exclude superimposed pneumonia given pattern of disease. 2. Potentially  diminished RIGHT pleural effusion. Electronically Signed   By: Zetta Bills M.D.   On: 02/26/2021 13:28   ECHOCARDIOGRAM COMPLETE  Result Date: 02/27/2021    ECHOCARDIOGRAM REPORT   Patient Name:   Alton S Wilkins Date of Exam: 02/27/2021 Medical Rec #:  [id number]      Height:       67.0 in Accession #:    [id number]     Weight:       222.0 lb Date of Birth:  1941-03-12      BSA:          2.114 m Patient Age:    40 years       BP:           156/74 mmHg Patient Gender: M              HR:           57 bpm. Exam Location:  Inpatient Procedure: 2D Echo Indications:    dyspnea  History:        Patient has prior history of Echocardiogram examinations, most                 recent 03/22/2020. CAD, Abnormal ECG, Arrythmias:symptomatic                 bradycardia; Risk Factors:Diabetes, Dyslipidemia and Sleep                 Apnea.  Sonographer:    Johny Chess Referring Phys: [place]  ROBERTS IMPRESSIONS  1. Left ventricular ejection fraction, by estimation, is 40 to 45%. The left ventricle has mildly decreased function. The left ventricle demonstrates global hypokinesis. There is mild asymmetric left ventricular hypertrophy. Left ventricular diastolic parameters are indeterminate.  2. Right ventricular systolic function was not well visualized. The right ventricular size is not well visualized. There is severely elevated pulmonary artery systolic pressure. The estimated right ventricular systolic pressure is 70.7 mmHg.  3. Right atrial size was mildly dilated.  4. The mitral valve is grossly normal. Trivial mitral valve regurgitation.  5. The aortic valve is grossly normal. There is mild calcification of the aortic valve. There is mild thickening of the aortic valve. Aortic valve regurgitation is trivial. Mild aortic valve stenosis. FINDINGS  Left Ventricle: Left ventricular ejection fraction, by estimation, is 40 to 45%. The left ventricle has mildly decreased function. The left ventricle demonstrates global  hypokinesis. The left ventricular internal cavity size was normal in size. There is  mild asymmetric left ventricular hypertrophy. Left ventricular diastolic parameters are indeterminate. Right Ventricle: The right ventricular size is not well visualized. Right vetricular wall thickness was not well visualized. Right ventricular systolic function was not well visualized. There is severely elevated pulmonary artery systolic pressure. The tricuspid regurgitant velocity is 4.33 m/s, and with an assumed right atrial pressure of 3 mmHg, the estimated right ventricular systolic pressure is 86.7 mmHg. Left Atrium: Left atrial size was normal in size. Right Atrium: Right atrial size was mildly dilated. Pericardium: There is no evidence of pericardial effusion. Mitral Valve: The mitral valve is grossly normal. Trivial mitral valve regurgitation. Tricuspid Valve: The tricuspid valve is normal in structure. Tricuspid valve regurgitation is mild. Aortic Valve: The aortic valve is grossly normal. There is mild calcification of the aortic valve. There is mild thickening of the aortic valve. Aortic valve regurgitation is trivial. Mild aortic stenosis is present. Aortic valve mean gradient measures 16.0 mmHg. Aortic valve peak gradient measures 30.3 mmHg. Aortic valve area, by VTI measures 0.89 cm. Pulmonic Valve: The pulmonic valve was normal in structure. Pulmonic valve regurgitation is not visualized. Aorta: The aortic root and ascending aorta are structurally normal, with no evidence of dilitation. IAS/Shunts: The atrial septum is grossly normal.  LEFT VENTRICLE PLAX 2D LVIDd:         5.10 cm LVIDs:         4.00 cm LV PW:         1.30 cm LV IVS:        1.10 cm LVOT diam:     2.00 cm LV SV:         54 LV SV Index:   25 LVOT Area:     3.14 cm  IVC IVC diam: 1.90 cm LEFT ATRIUM             Index       RIGHT ATRIUM           Index LA diam:        4.40 cm 2.08 cm/m  RA Area:     19.10 cm LA Vol (A2C):   67.3 ml 31.84 ml/m RA  Volume:   49.50 ml  23.42 ml/m LA Vol (A4C):   64.0 ml 30.27 ml/m LA Biplane Vol: 67.1 ml 31.74 ml/m  AORTIC VALVE AV Area (Vmax):    0.93 cm AV Area (Vmean):   0.95 cm AV Area (VTI):     0.89 cm AV Vmax:  275.33 cm/s AV Vmean:          180.667 cm/s AV VTI:            0.603 m AV Peak Grad:      30.3 mmHg AV Mean Grad:      16.0 mmHg LVOT Vmax:         81.77 cm/s LVOT Vmean:        54.900 cm/s LVOT VTI:          0.171 m LVOT/AV VTI ratio: 0.28  AORTA Ao Root diam: 3.00 cm Ao Asc diam:  3.30 cm MITRAL VALVE                TRICUSPID VALVE MV Area (PHT): 5.75 cm     TR Peak grad:   75.0 mmHg MV Decel Time: 132 msec     TR Vmax:        433.00 cm/s MV E velocity: 136.00 cm/s MV A velocity: 50.80 cm/s   SHUNTS MV E/A ratio:  2.68         Systemic VTI:  0.17 m                             Systemic Diam: 2.00 cm Mertie Moores MD Electronically signed by Mertie Moores MD Signature Date/Time: 02/27/2021/1:20:59 PM    Final    OCT, Retina - OU - Both Eyes  Result Date: 02/19/2021 Right Eye Quality was good. Scan locations included subfoveal. Central Foveal Thickness: 335. Progression has no prior data. Findings include abnormal foveal contour. Left Eye Quality was good. Scan locations included subfoveal. Central Foveal Thickness: 314. Progression has been stable. Findings include epiretinal membrane. Notes Improving OD, 1 week post vitrectomy membrane peel OS with minor epiretinal membrane in the temporal aspect of the macula, no foveal distortion   Time Spent in minutes  30   Lala Lund M.D on 03/01/2021 at 9:57 AM  To page go to [email or web address]

## 2021-03-01 NOTE — Plan of Care (Signed)

## 2021-03-02 DIAGNOSIS — R001 Bradycardia, unspecified: Secondary | ICD-10-CM | POA: Diagnosis not present

## 2021-03-02 LAB — COMPREHENSIVE METABOLIC PANEL
ALT: 13 U/L (ref 0–44)
AST: 18 U/L (ref 15–41)
Albumin: 2.7 g/dL — ABNORMAL LOW (ref 3.5–5.0)
Alkaline Phosphatase: 95 U/L (ref 38–126)
Anion gap: 4 — ABNORMAL LOW (ref 5–15)
BUN: 20 mg/dL (ref 8–23)
CO2: 31 mmol/L (ref 22–32)
Calcium: 8.9 mg/dL (ref 8.9–10.3)
Chloride: 102 mmol/L (ref 98–111)
Creatinine, Ser: 1.54 mg/dL — ABNORMAL HIGH (ref 0.61–1.24)
GFR, Estimated: 45 mL/min — ABNORMAL LOW (ref >60)
Glucose, Bld: 251 mg/dL — ABNORMAL HIGH (ref 70–99)
Potassium: 4.1 mmol/L (ref 3.5–5.1)
Sodium: 137 mmol/L (ref 135–145)
Total Bilirubin: 0.8 mg/dL (ref 0.3–1.2)
Total Protein: 5.8 g/dL — ABNORMAL LOW (ref 6.5–8.1)

## 2021-03-02 LAB — GLUCOSE, CAPILLARY
Glucose-Capillary: 166 mg/dL — ABNORMAL HIGH (ref 70–99)
Glucose-Capillary: 197 mg/dL — ABNORMAL HIGH (ref 70–99)
Glucose-Capillary: 283 mg/dL — ABNORMAL HIGH (ref 70–99)
Glucose-Capillary: 197 mg/dL — ABNORMAL HIGH (ref 70–99)

## 2021-03-02 LAB — MAGNESIUM: Magnesium: 2.1 mg/dL (ref 1.7–2.4)

## 2021-03-02 MED ORDER — FUROSEMIDE 40 MG PO TABS
40.0000 mg | ORAL_TABLET | Freq: Two times a day (BID) | ORAL | Status: DC
  Administered 2021-03-02 (×2): 40 mg via ORAL
  Filled 2021-03-02 (×2): qty 1

## 2021-03-02 MED ORDER — POTASSIUM CHLORIDE CRYS ER 20 MEQ PO TBCR: 20.0000 meq | EXTENDED_RELEASE_TABLET | Freq: Every day | ORAL | Status: DC

## 2021-03-02 NOTE — Progress Notes (Signed)
PROGRESS NOTE                                                                                                                                                                                                             Patient Demographics:    Randall Wilkins, is a 80 y.o. male, DOB - 1941/08/24, WEX:[id number]  Admit date - 02/26/2021   Admitting Physician Pratik Darleen Crocker, DO  Outpatient Primary MD for the patient is [name], MD  LOS - 3  Chief Complaint  Patient presents with  . Fatigue  . Bradycardia       Brief Narrative (HPI from H&P) Randall Wilkins  is a 80 y.o. 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 Curt Bears on daily oral chemotherapy, paroxysmal A. fib [place] vas 2 score of 6, CAD s/p CABG, stroke in the past, CKD 3 a baseline creatinine around 1.4, hypertension, dyslipidemia, DM type II, aortic valve stenosis, combined chronic systolic and diastolic heart failure last EF around 45%, left leg weakness after bout of severe rhabdomyolysis wears a brace, memory dysfunction placed on Aricept, he developed severe generalized weakness, difficulty to ambulate, home PT noticed that his heart rate was in high 20s, he came to the hospital where he was found to have sinus bradycardia along with signs of CHF and admitted to the hospital.   Subjective:   Patient in bed, appears comfortable, denies any headache, no fever, no chest pain or pressure, no shortness of breath , no abdominal pain. No focal weakness.    Assessment  & Plan :      1.  Symptomatic bradycardia.  This could be due to combination of beta-blocker use and recent Aricept use. His TSH is stable, holding offending medications, still extremely weak and in mild CHF, PT to evaluate, may require placement to SNF, currently not safe discharge.  Cardiology following.  2.  Paroxysmal A. fib with [place] vas 2 score of 6.  Continue Eliquis for anticoagulation, hold beta-blocker, cardiology on board heart rate improved after prednisone wean.  3.  CAD s/p CABG.  Currently chest pain-free with nonacute EKG, continue Eliquis hold beta-blocker.  4.  Aortic stenosis. TTE noted, mild aortic stenosis, cardiology following.  5. Acute on Chronic combined systolic and diastolic heart failure last EF around 45%.  Has been adequately diuresed with high-dose IV Lasix, now creatinine has bumped slightly and he appears euvolemic, on his  home 2 L nasal cannula oxygen now with no shortness of breath, titrate down from 40 mg oral twice daily Lasix and monitor.   6.  Rhabdomyolysis induced left leg muscular injury causing left leg weakness.  Continue wearing brace when out of bed, PT OT.  7.  Previous history of stroke.  Supportive care.  Continue Eliquis.  8.  Stage IV non-small cell lung cancer -continue oral chemotherapy, Dr. Julien Nordmann has been informed.  9.  Obesity with OSA.  Follow with PCP for weight loss BMI 34, nighttime oxygen to be continued when he sleeps.  10.  CKD stage IIIa.  Creatinine appears to be close to baseline.  Will monitor.  11. Dehydration. Resolved, has been hydrated with IVF.  13.  BPH.  Continue home dose alpha-blocker.  14. Hypothyroidism.  stable TSH continue home dose Synthroid.  15.  Hypertension.  Placed on Norvasc along with as needed IV hydralazine.   16.  Early dementia.  Hold Aricept due to bradycardia, at risk for delirium, patient and husband both want that patient can get delirious while he is in the hospital, use Haldol if needed, minimize narcotics and benzodiazepines  17. Hypokalemia.  Replaced.    18. DM type II.  On Tresiba along with sliding scale to continue.  Lab Results  Component Value Date   HGBA1C 6.7 (H) 02/26/2021   CBG (last 3)  Recent Labs    03/01/21 1630 03/01/21 2037 03/02/21 0538  GLUCAP 269* 174* 166*   Lab Results  Component Value Date    TSH 1.184 02/26/2021      Condition - Extremely Guarded  Family Communication  :  Wife bedside 3/16, 3/17, 3/18, 3/20  Code Status :  Full  Consults  :  Cards  Procedures  :    TTE - 1. Left ventricular ejection fraction, by estimation, is 40 to 45%. The left ventricle has mildly decreased function. The left ventricle demonstrates global hypokinesis. There is mild asymmetric left ventricular hypertrophy. Left ventricular diastolic  parameters are indeterminate.  2. Right ventricular systolic function was not well visualized. The right ventricular size is not well visualized. There is severely elevated pulmonary artery systolic pressure. The estimated right ventricular systolic pressure is 50.9 mmHg.  3. Right atrial size was mildly dilated.  4. The mitral valve is grossly normal. Trivial mitral valve regurgitation.  5. The aortic valve is grossly normal. There is mild calcification of the aortic valve. There is mild thickening of the aortic valve. Aortic valve regurgitation is trivial. Mild aortic valve stenosis.   PUD Prophylaxis : None  Disposition Plan  : Likely discharge 02/02/2021  Status is: Inpt  Dispo: The patient is from: Home              Anticipated d/c is to: TBD              Patient currently is not medically stable to d/c.   Difficult to place patient No  DVT Prophylaxis  :  Eliquis  Lab Results  Component Value Date   PLT 127 (L) 03/01/2021    Diet :  Diet Order            Diet Carb Modified Fluid consistency: Thin; Room service appropriate? Yes  Diet effective now                  Inpatient Medications Scheduled Meds: . allopurinol  100 mg Oral Daily  . amLODipine  10 mg Oral Daily  . apixaban  2.5 mg Oral BID  . Chlorhexidine Gluconate Cloth  6 each Topical Daily  . feeding supplement  237 mL Oral TID BM  . furosemide  40 mg Oral BID  . insulin aspart  0-5 Units Subcutaneous QHS  . insulin aspart  0-9 Units Subcutaneous TID WC  .  insulin glargine  20 Units Subcutaneous QHS  . levothyroxine  75 mcg Oral QAC breakfast  . multivitamin with minerals  1 tablet Oral Daily  . [START ON 03/03/2021] potassium chloride  20 mEq Oral Daily  . sodium chloride flush  10-40 mL Intracatheter Q12H  . sotorasib  960 mg Oral q morning  . tamsulosin  0.4 mg Oral Daily  . umeclidinium-vilanterol  1 puff Inhalation Daily  . vitamin B-12  1,000 mcg Oral Daily   Continuous Infusions: PRN Meds:.acetaminophen **OR** [DISCONTINUED] acetaminophen, albuterol, hydrALAZINE, [DISCONTINUED] ondansetron **OR** ondansetron (ZOFRAN) IV  Antibiotics  :   Anti-infectives (From admission, onward)   None          Objective:   Vitals:   03/02/21 0150 03/02/21 0500 03/02/21 0734 03/02/21 0736  BP:  (!) 154/74 140/88   Pulse:  84 87   Resp:  (!) 21 20   Temp:  98.4 F (36.9 C) 99 F (37.2 C)   TempSrc:  Oral Oral   SpO2:  100% 96% 98%  Weight: 100.9 kg 99.9 kg    Height:        SpO2: 98 % O2 Flow Rate (L/min): 2 L/min  Wt Readings from Last 3 Encounters:  03/02/21 99.9 kg  02/18/21 102.7 kg  02/06/21 105.2 kg     Intake/Output Summary (Last 24 hours) at 03/02/2021 1057 Last data filed at 03/02/2021 0905 Gross per 24 hour  Intake 913 ml  Output 1700 ml  Net -787 ml     Physical Exam  Awake Alert, No new F.N deficits, Normal affect [place].AT,PERRAL Supple Neck,No JVD, No cervical lymphadenopathy appriciated.  Symmetrical Chest wall movement, Good air movement bilaterally, CTAB RRR,No Gallops, +ve systolic Murmur +ve B.Sounds, Abd Soft, No tenderness, No organomegaly appriciated, No rebound - guarding or rigidity. No Cyanosis, Clubbing or edema, No new Rash or bruise     Data Review:   Recent Labs  Lab 02/26/21 1221 02/27/21 0439 02/28/21 0416 03/01/21 0302  WBC 5.6 6.0 5.7 5.6  HGB 11.8* 11.0* 10.6* 11.7*  HCT 39.5 36.6* 34.8* 38.3*  PLT 131* 137* 129* 127*  MCV 94.0 93.6 91.1 91.2  MCH 28.1 28.1 27.7 27.9  MCHC  29.9* 30.1 30.5 30.5  RDW 15.3 15.3 15.4 15.4  LYMPHSABS  --  1.2 1.2 1.1  MONOABS  --  0.8 0.9 0.9  EOSABS  --  0.1 0.1 0.1  BASOSABS  --  0.1 0.0 0.0    Recent Labs  Lab 02/26/21 1221 02/26/21 1248 02/26/21 1250 02/26/21 1514 02/27/21 0439 02/28/21 0416 02/28/21 0417 03/01/21 0302 03/02/21 0241  NA 140  --   --   --  140 141  --  140 137  K 3.8  --   --   --  3.4* 3.6  --  3.7 4.1  CL 103  --   --   --  102 103  --  102 102  CO2 29  --   --   --  30 30  --  30 31  GLUCOSE 114*  --   --   --  116* 128*  --  105* 251*  BUN 22  --   --   --  19 18  --  18 20  CREATININE 1.46*  --   --   --  1.42* 1.43*  --  1.54* 1.54*  CALCIUM 8.9  --   --   --  9.0 9.1  --  9.0 8.9  AST  --   --   --   --  22 18  --  25 18  ALT  --   --   --   --  15 13  --  11 13  ALKPHOS  --   --   --   --  114 105  --  110 95  BILITOT  --   --   --   --  0.8 0.7  --  1.4* 0.8  ALBUMIN  --   --   --   --  3.0* 2.9*  --  2.9* 2.7*  MG  --  1.1*  --   --  2.3 2.2  --  2.1 2.1  TSH  --   --  1.184  --   --   --   --   --   --   HGBA1C  --   --   --  6.7*  --   --   --   --   --   BNP  --   --   --   --  7,124.5*  --  1,430.5* 1,117.2*  --     Recent Labs  Lab 02/27/21 0439 02/27/21 1942 02/28/21 0417 03/01/21 0302  BNP 1,168.2*  --  1,430.5* 1,117.2*  SARSCOV2NAA  --  NEGATIVE  --   --     ------------------------------------------------------------------------------------------------------------------ No results for input(s): CHOL, HDL, LDLCALC, TRIG, CHOLHDL, LDLDIRECT in the last 72 hours.  Lab Results  Component Value Date   HGBA1C 6.7 (H) 02/26/2021   ------------------------------------------------------------------------------------------------------------------ No results for input(s): TSH, T4TOTAL, T3FREE, THYROIDAB in the last 72 hours.  Invalid input(s): FREET3 ------------------------------------------------------------------------------------------------------------------ No  results for input(s): VITAMINB12, FOLATE, FERRITIN, TIBC, IRON, RETICCTPCT in the last 72 hours.  Coagulation profile No results for input(s): INR, PROTIME in the last 168 hours.  No results for input(s): DDIMER in the last 72 hours.  Cardiac Enzymes No results for input(s): CKMB, TROPONINI, MYOGLOBIN in the last 168 hours.  Invalid input(s): CK ------------------------------------------------------------------------------------------------------------------    Component Value Date/Time   BNP 1,117.2 (H) 03/01/2021 0302    Micro Results Recent Results (from the past 240 hour(s))  Respiratory (~20 pathogens) panel by PCR     Status: None   Collection Time: 02/26/21  4:20 PM   Specimen: Nasopharyngeal Swab; Respiratory  Result Value Ref Range Status   Adenovirus NOT DETECTED NOT DETECTED Final   Coronavirus 229E NOT DETECTED NOT DETECTED Final    Comment: (NOTE) The Coronavirus on the Respiratory Panel, DOES NOT test for the novel  Coronavirus (2019 nCoV)    Coronavirus HKU1 NOT DETECTED NOT DETECTED Final   Coronavirus NL63 NOT DETECTED NOT DETECTED Final   Coronavirus OC43 NOT DETECTED NOT DETECTED Final   Metapneumovirus NOT DETECTED NOT DETECTED Final   Rhinovirus / Enterovirus NOT DETECTED NOT DETECTED Final   Influenza A NOT DETECTED NOT DETECTED Final   Influenza B NOT DETECTED NOT DETECTED Final   Parainfluenza Virus 1 NOT DETECTED NOT DETECTED Final   Parainfluenza Virus 2 NOT DETECTED NOT DETECTED Final   Parainfluenza Virus 3 NOT DETECTED NOT DETECTED Final   Parainfluenza Virus 4 NOT DETECTED NOT DETECTED Final   Respiratory Syncytial Virus  NOT DETECTED NOT DETECTED Final   Bordetella pertussis NOT DETECTED NOT DETECTED Final   Bordetella Parapertussis NOT DETECTED NOT DETECTED Final   Chlamydophila pneumoniae NOT DETECTED NOT DETECTED Final   Mycoplasma pneumoniae NOT DETECTED NOT DETECTED Final    Comment: Performed at [facility], [place] [address],  [place], [place] [address]  SARS CORONAVIRUS 2 (TAT 6-24 HRS) Nasopharyngeal Nasopharyngeal Swab     Status: None   Collection Time: 02/27/21  7:42 PM   Specimen: Nasopharyngeal Swab  Result Value Ref Range Status   SARS Coronavirus 2 NEGATIVE NEGATIVE Final    Comment: (NOTE) SARS-CoV-2 target nucleic acids are NOT DETECTED.  The SARS-CoV-2 RNA is generally detectable in upper and lower respiratory specimens during the acute phase of infection. Negative results do not preclude SARS-CoV-2 infection, do not rule out co-infections with other pathogens, and should not be used as the sole basis for treatment or other patient management decisions. Negative results must be combined with clinical observations, patient history, and epidemiological information. The expected result is Negative.  Fact Sheet for Patients: SugarRoll.be  Fact Sheet for Healthcare Providers: https://www.woods-mathews.com/  This test is not yet approved or cleared by the [place] FDA and  has been authorized for detection and/or diagnosis of SARS-CoV-2 by FDA under an Emergency Use Authorization (EUA). This EUA will remain  in effect (meaning this test can be used) for the duration of the COVID-19 declaration under Se ction 564(b)(1) of the Act, 21 U.S.C. section 360bbb-3(b)(1), unless the authorization is terminated or revoked sooner.  Performed at [facility], [place] [address], [place], [place] [address]     Radiology Reports DG Chest 2 View  Result Date: 02/26/2021 CLINICAL DATA:  Weakness on home oxygen. EXAM: CHEST - 2 VIEW COMPARISON:  November 05, 2020 FINDINGS: RIGHT IJ Port-A-Cath in situ. Post median sternotomy for CABG. Heart size remains enlarged. Opacity at the RIGHT lung base and LEFT mid chest as on the prior study. Potentially diminished effusion in the RIGHT chest compared to previous imaging. On limited assessment no acute skeletal process.  IMPRESSION: 1. Opacity at the RIGHT lung base and LEFT mid chest, similar to the prior study. Patient with bilateral lung masses. No definite acute process. Difficult to exclude superimposed pneumonia given pattern of disease. 2. Potentially diminished RIGHT pleural effusion. Electronically Signed   By: Zetta Bills M.D.   On: 02/26/2021 13:28   ECHOCARDIOGRAM COMPLETE  Result Date: 02/27/2021    ECHOCARDIOGRAM REPORT   Patient Name:   Randall Wilkins Date of Exam: 02/27/2021 Medical Rec #:  [id number]      Height:       67.0 in Accession #:    [id number]     Weight:       222.0 lb Date of Birth:  [birth year]      BSA:          2.114 m Patient Age:    3 years       BP:           156/74 mmHg Patient Gender: M              HR:           57 bpm. Exam Location:  Inpatient Procedure: 2D Echo Indications:    dyspnea  History:        Patient has prior history of Echocardiogram examinations, most  recent 03/22/2020. CAD, Abnormal ECG, Arrythmias:symptomatic                 bradycardia; Risk Factors:Diabetes, Dyslipidemia and Sleep                 Apnea.  Sonographer:    Johny Chess Referring Phys: [place]  1. Left ventricular ejection fraction, by estimation, is 40 to 45%. The left ventricle has mildly decreased function. The left ventricle demonstrates global hypokinesis. There is mild asymmetric left ventricular hypertrophy. Left ventricular diastolic parameters are indeterminate.  2. Right ventricular systolic function was not well visualized. The right ventricular size is not well visualized. There is severely elevated pulmonary artery systolic pressure. The estimated right ventricular systolic pressure is 23.5 mmHg.  3. Right atrial size was mildly dilated.  4. The mitral valve is grossly normal. Trivial mitral valve regurgitation.  5. The aortic valve is grossly normal. There is mild calcification of the aortic valve. There is mild thickening of the aortic valve.  Aortic valve regurgitation is trivial. Mild aortic valve stenosis. FINDINGS  Left Ventricle: Left ventricular ejection fraction, by estimation, is 40 to 45%. The left ventricle has mildly decreased function. The left ventricle demonstrates global hypokinesis. The left ventricular internal cavity size was normal in size. There is  mild asymmetric left ventricular hypertrophy. Left ventricular diastolic parameters are indeterminate. Right Ventricle: The right ventricular size is not well visualized. Right vetricular wall thickness was not well visualized. Right ventricular systolic function was not well visualized. There is severely elevated pulmonary artery systolic pressure. The tricuspid regurgitant velocity is 4.33 m/s, and with an assumed right atrial pressure of 3 mmHg, the estimated right ventricular systolic pressure is 36.1 mmHg. Left Atrium: Left atrial size was normal in size. Right Atrium: Right atrial size was mildly dilated. Pericardium: There is no evidence of pericardial effusion. Mitral Valve: The mitral valve is grossly normal. Trivial mitral valve regurgitation. Tricuspid Valve: The tricuspid valve is normal in structure. Tricuspid valve regurgitation is mild. Aortic Valve: The aortic valve is grossly normal. There is mild calcification of the aortic valve. There is mild thickening of the aortic valve. Aortic valve regurgitation is trivial. Mild aortic stenosis is present. Aortic valve mean gradient measures 16.0 mmHg. Aortic valve peak gradient measures 30.3 mmHg. Aortic valve area, by VTI measures 0.89 cm. Pulmonic Valve: The pulmonic valve was normal in structure. Pulmonic valve regurgitation is not visualized. Aorta: The aortic root and ascending aorta are structurally normal, with no evidence of dilitation. IAS/Shunts: The atrial septum is grossly normal.  LEFT VENTRICLE PLAX 2D LVIDd:         5.10 cm LVIDs:         4.00 cm LV PW:         1.30 cm LV IVS:        1.10 cm LVOT diam:     2.00 cm  LV SV:         54 LV SV Index:   25 LVOT Area:     3.14 cm  IVC IVC diam: 1.90 cm LEFT ATRIUM             Index       RIGHT ATRIUM           Index LA diam:        4.40 cm 2.08 cm/m  RA Area:     19.10 cm LA Vol (A2C):   67.3 ml 31.84 ml/m RA Volume:  49.50 ml  23.42 ml/m LA Vol (A4C):   64.0 ml 30.27 ml/m LA Biplane Vol: 67.1 ml 31.74 ml/m  AORTIC VALVE AV Area (Vmax):    0.93 cm AV Area (Vmean):   0.95 cm AV Area (VTI):     0.89 cm AV Vmax:           275.33 cm/s AV Vmean:          180.667 cm/s AV VTI:            0.603 m AV Peak Grad:      30.3 mmHg AV Mean Grad:      16.0 mmHg LVOT Vmax:         81.77 cm/s LVOT Vmean:        54.900 cm/s LVOT VTI:          0.171 m LVOT/AV VTI ratio: 0.28  AORTA Ao Root diam: 3.00 cm Ao Asc diam:  3.30 cm MITRAL VALVE                TRICUSPID VALVE MV Area (PHT): 5.75 cm     TR Peak grad:   75.0 mmHg MV Decel Time: 132 msec     TR Vmax:        433.00 cm/s MV E velocity: 136.00 cm/s MV A velocity: 50.80 cm/s   SHUNTS MV E/A ratio:  2.68         Systemic VTI:  0.17 m                             Systemic Diam: 2.00 cm Mertie Moores MD Electronically signed by Mertie Moores MD Signature Date/Time: 02/27/2021/1:20:59 PM    Final    OCT, Retina - OU - Both Eyes  Result Date: 02/19/2021 Right Eye Quality was good. Scan locations included subfoveal. Central Foveal Thickness: 335. Progression has no prior data. Findings include abnormal foveal contour. Left Eye Quality was good. Scan locations included subfoveal. Central Foveal Thickness: 314. Progression has been stable. Findings include epiretinal membrane. Notes Improving OD, 1 week post vitrectomy membrane peel OS with minor epiretinal membrane in the temporal aspect of the macula, no foveal distortion   Time Spent in minutes  30   Lala Lund M.D on 03/02/2021 at 10:57 AM  To page go to [email or web address]

## 2021-03-02 NOTE — Progress Notes (Signed)
Progress Note  Patient Name: Nayib S [place] Date of Encounter: 03/02/2021  Primary Cardiologist: Pixie Casino, MD   Subjective   Dyspnea improved.  Inpatient Medications    Scheduled Meds: . allopurinol  100 mg Oral Daily  . amLODipine  10 mg Oral Daily  . apixaban  2.5 mg Oral BID  . Chlorhexidine Gluconate Cloth  6 each Topical Daily  . feeding supplement  237 mL Oral TID BM  . furosemide  40 mg Oral BID  . insulin aspart  0-5 Units Subcutaneous QHS  . insulin aspart  0-9 Units Subcutaneous TID WC  . insulin glargine  20 Units Subcutaneous QHS  . levothyroxine  75 mcg Oral QAC breakfast  . multivitamin with minerals  1 tablet Oral Daily  . [START ON 03/03/2021] potassium chloride  20 mEq Oral Daily  . sodium chloride flush  10-40 mL Intracatheter Q12H  . sotorasib  960 mg Oral q morning  . tamsulosin  0.4 mg Oral Daily  . umeclidinium-vilanterol  1 puff Inhalation Daily  . vitamin B-12  1,000 mcg Oral Daily   Continuous Infusions:  PRN Meds: acetaminophen **OR** [DISCONTINUED] acetaminophen, albuterol, hydrALAZINE, [DISCONTINUED] ondansetron **OR** ondansetron (ZOFRAN) IV   Vital Signs    Vitals:   03/02/21 0500 03/02/21 0734 03/02/21 0736 03/02/21 1113  BP: (!) 154/74 140/88  (!) 146/84  Pulse: 84 87    Resp: (!) 21 20    Temp: 98.4 F (36.9 C) 99 F (37.2 C)    TempSrc: Oral Oral    SpO2: 100% 96% 98%   Weight: 99.9 kg     Height:        Intake/Output Summary (Last 24 hours) at 03/02/2021 1148 Last data filed at 03/02/2021 1119 Gross per 24 hour  Intake 923 ml  Output 1700 ml  Net -777 ml   Filed Weights   03/01/21 0347 03/02/21 0150 03/02/21 0500  Weight: 98.9 kg 100.9 kg 99.9 kg    Telemetry    Normal sinus rhythm with PVCs and AV Wenke block and marked first-degree AV block- Personally Reviewed  ECG    None- Personally Reviewed  Physical Exam   GEN:  Chronically ill-appearing, no acute distress.   Neck:  7 cm JVD Cardiac: IRRR, no  murmurs, rubs, or gallops.  Respiratory: Clear to auscultation bilaterally. GI: Soft, nontender, non-distended  MS: No edema; No deformity. Neuro:  Nonfocal  Psych: Normal affect   Labs    Chemistry Recent Labs  Lab 02/28/21 0416 03/01/21 0302 03/02/21 0241  NA 141 140 137  K 3.6 3.7 4.1  CL 103 102 102  CO2 30 30 31   GLUCOSE 128* 105* 251*  BUN 18 18 20   CREATININE 1.43* 1.54* 1.54*  CALCIUM 9.1 9.0 8.9  PROT 5.9* 5.9* 5.8*  ALBUMIN 2.9* 2.9* 2.7*  AST 18 25 18   ALT 13 11 13   ALKPHOS 105 110 95  BILITOT 0.7 1.4* 0.8  GFRNONAA 50* 45* 45*  ANIONGAP 8 8 4*     Hematology Recent Labs  Lab 02/27/21 0439 02/28/21 0416 03/01/21 0302  WBC 6.0 5.7 5.6  RBC 3.91* 3.82* 4.20*  HGB 11.0* 10.6* 11.7*  HCT 36.6* 34.8* 38.3*  MCV 93.6 91.1 91.2  MCH 28.1 27.7 27.9  MCHC 30.1 30.5 30.5  RDW 15.3 15.4 15.4  PLT 137* 129* 127*    Cardiac EnzymesNo results for input(s): TROPONINI in the last 168 hours. No results for input(s): TROPIPOC in the last 168 hours.  BNP Recent Labs  Lab 02/27/21 0439 02/28/21 0417 03/01/21 0302  BNP 1,168.2* 1,430.5* 1,117.2*     DDimer No results for input(s): DDIMER in the last 168 hours.   Radiology    No results found.  Cardiac Studies   None  Patient Profile     80 y.o. male admitted with volume overload, status post diuresis.  Assessment & Plan    1.  Acute on chronic systolic and diastolic heart failure.  His weight is down an additional 2 pounds.  He will continue his intravenous diuretic therapy.  His kidney function is stable.  I would anticipate transition to oral diuretic therapy tomorrow. 2.  Heart block -he has mostly AV Wenke block.  He is off of his AV nodal blocking drugs.  Currently no indication for pacemaker insertion.  With lung cancer, we would hope to avoid pacemaker 3.  PVCs -these appear to be asymptomatic.  For questions or updates, please contact [place] Please consult [email or web address] for  contact info under Cardiology/STEMI.   Signed, Cristopher Peru, MD  03/02/2021, 11:48 AM  Patient ID: Jovanne S [place], male   DOB: September 09, 1941, 80 y.o.   MRN: [id number]

## 2021-03-02 NOTE — Progress Notes (Signed)
ANTICOAGULATION CONSULT NOTE - Initial Consult  Pharmacy Consult for apixaban Indication: atrial fibrillation  Allergies  Allergen Reactions  . Actos [Pioglitazone] Swelling  . Metformin And Related Nausea Only    3.16.2022 pt is currently taking this medication at home  . Niaspan [Niacin Er] Itching and Rash  . Wound Dressing Adhesive Rash    Patient Measurements: Height: _0  (170.2 cm) Weight: 99.9 kg (220 lb 3.8 oz) IBW/kg (Calculated) : 66.1  Vital Signs: Temp: 99 F (37.2 C) (03/20 0734) Temp Source: Oral (03/20 0734) BP: 140/88 (03/20 0734) Pulse Rate: 87 (03/20 0734)  Labs: Recent Labs    02/28/21 0416 03/01/21 0302 03/02/21 0241  HGB 10.6* 11.7*  --   HCT 34.8* 38.3*  --   PLT 129* 127*  --   CREATININE 1.43* 1.54* 1.54*    Estimated Creatinine Clearance: 43.1 mL/min (A) (by C-G formula based on SCr of 1.54 mg/dL (H)).   Medical History: Past Medical History:  Diagnosis Date  . Aortic stenosis, mild 11/14/2013  . Aortic valve sclerosis 03/29/2015  . Bilateral leg edema 05/21/2014  . CAD (coronary artery disease)   . Chronic diastolic congestive heart failure ([place])   . Chronic kidney disease (CKD), stage III (moderate) ([facility])   . Diabetes 1.5, managed as type 1 ([place]) 02/04/2013  . Diabetic peripheral neuropathy associated with type 1 diabetes mellitus ([place]) 11/14/2013  . Dysphagia   . Dyspnea on exertion 03/21/2015  . Exogenous obesity   . Gout   . Heart attack ([place])   . History of nuclear stress test 07/2011   dipyridamole; fixed inferolateral defect, worse at stress than rest; no reversible ischemia; low risk scan   . Hyperlipidemia   . Hypertension   . Hypothyroidism   . Insulin dependent diabetes mellitus   . Left foot drop   . Left main coronary artery disease 03/28/2015  . lung ca dx'd 08/2020  . Memory loss   . Obesity (BMI 30-39.9) 11/14/2013  . Obstructive sleep apnea 03/21/2015  . OSA on CPAP    uses a cpap  . Peptic ulcer with hemorrhage  03/28/2015  . Peripheral neuropathy   . Pneumonia   . Rhabdomyolysis   . 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 Stroke ([place])    L patietal with small scattered lacunar infarcts  . Thrombocytopenia ([place]) 03/21/2015  . Venous insufficiency   . Weakness generalized 03/21/2015    Medications:  Medications Prior to Admission  Medication Sig Dispense Refill Last Dose  . albuterol (VENTOLIN HFA) 108 (90 Base) MCG/ACT inhaler Inhale 2 puffs into the lungs every 6 (six) hours as needed for wheezing or shortness of breath. 8 g 6 Past Week at Unknown time  . allopurinol (ZYLOPRIM) 100 MG tablet Take 1 tablet (100 mg total) by mouth daily. 30 tablet 6 02/26/2021 at 0800  . amitriptyline (ELAVIL) 75 MG tablet Take 75 mg by mouth at bedtime.   02/25/2021 at Unknown time  . apixaban (ELIQUIS) 2.5 MG TABS tablet Take 1 tablet (2.5 mg total) by mouth 2 (two) times daily. 180 tablet 3 02/26/2021 at 0800  . donepezil (ARICEPT) 10 MG tablet Take 1 tablet (10 mg total) by mouth daily. (Patient taking differently: Take 10 mg by mouth at bedtime.) 90 tablet 4 02/25/2021 at Unknown time  . furosemide (LASIX) 40 MG tablet Take 1 tablet (40 mg total) by mouth 2 (two) times daily. (Patient taking differently:  Take 40 mg by mouth See admin instructions. 86m in the morning and at 1pm) 180 tablet 1 02/26/2021 at Unknown time  . insulin lispro (HUMALOG) 100 UNIT/ML cartridge Inject 5-15 Units into the skin 3 (three) times daily with meals. Sliding scale.   02/26/2021 at Unknown time  . levothyroxine (SYNTHROID, LEVOTHROID) 75 MCG tablet Take 75 mcg by mouth daily before breakfast.   1 02/26/2021 at 0800  . lidocaine (XYLOCAINE) 2 % solution Use as directed 15 mLs in the mouth or throat as needed for mouth pain. 100 mL 0 unk  . lidocaine-prilocaine (EMLA) cream Apply 1 application topically as needed. Apply a teaspoon over port site at least 1 hour prior to lab appt. Do not rub in and cover with  plastic wrap. 30 g 0 Past Week at Unknown time  . lisinopril (ZESTRIL) 10 MG tablet TAKE ONE TABLET DAILY (Patient taking differently: Take 10 mg by mouth daily.) 90 tablet 2 02/26/2021 at Unknown time  . metFORMIN (GLUCOPHAGE-XR) 750 MG 24 hr tablet TAKE 1 TABLET WITH DINNER 90 tablet 0 02/25/2021 at Unknown time  . metoprolol succinate (TOPROL-XL) 25 MG 24 hr tablet Take 1 tablet (25 mg total) by mouth daily. 90 tablet 3 02/26/2021 at 0800  . ondansetron (ZOFRAN) 4 MG tablet Take 1 tablet (4 mg total) by mouth every 6 (six) hours as needed for nausea. 20 tablet 0 unk  . potassium chloride SA (K-DUR,KLOR-CON) 20 MEQ tablet Take 20 mEq by mouth 2 (two) times daily.    02/26/2021 at Unknown time  . prednisoLONE acetate (PRED FORTE) 1 % ophthalmic suspension Place 1 drop into the right eye 4 (four) times daily. 10 mL 0 02/26/2021 at Unknown time  . prochlorperazine (COMPAZINE) 10 MG tablet Take 1 tablet (10 mg total) by mouth every 6 (six) hours as needed for nausea or vomiting. 30 tablet 0 unk  . rosuvastatin (CRESTOR) 20 MG tablet Take 20 mg by mouth daily.   02/26/2021 at 0800  . sotorasib (LUMAKRAS) 120 MG TABS Take 960 mg by mouth every morning. 240 tablet 3 02/26/2021 at 0800  . Tamsulosin HCl (FLOMAX) 0.4 MG CAPS Take 0.4 mg by mouth at bedtime.   02/25/2021 at Unknown time  . Tiotropium Bromide Monohydrate (SPIRIVA RESPIMAT) 2.5 MCG/ACT AERS Inhale 2 puffs into the lungs daily. 4 g 3 02/26/2021 at Unknown time  . vitamin B-12 (CYANOCOBALAMIN) 1000 MCG tablet Take 1,000 mcg by mouth daily.   02/26/2021 at Unknown time  . Blood Glucose Monitoring Suppl (CONTOUR NEXT MONITOR) w/Device KIT 1 Device by Does not apply route 3 (three) times daily. Use to check blood sugars 3 times daily. Dx Code E13.9 1 kit 2   . CONTOUR NEXT TEST test strip USE 1 STRIP TO CHECK GLUCOSE 4 TIMES DAILY 300 each 1   . insulin degludec (TRESIBA FLEXTOUCH) 200 UNIT/ML FlexTouch Pen Inject 24 Units into the skin at bedtime. (Patient  taking differently: Inject 16 Units into the skin at bedtime.)     . Insulin Pen Needle (EASY COMFORT PEN NEEDLES) 33G X 4 MM MISC 1 each by Does not apply route See admin instructions. Use to inject insulin 5 times daily. 150 each 3   . OXYGEN Inhale 2 L into the lungs. At bedtime and during the day prn       Assessment: [id number] with h/o Afib on apixaban on home who presented to the ED with generalized weakness and bradycardia. Pharmacy consulted to resume apixaban therapy.  H/H low, stable. SCr 1.4-1.5 this admission.   Goal of Therapy:  Primary stroke prevention Monitor platelets by anticoagulation protocol: Yes   Plan:  -Continue home apixaban regimen of 2.5 mg twice daily -Monitor CBC and s/s of bleeding  Mercy Riding, PharmD PGY1 Acute Care Pharmacy Resident Please refer to [facility] for unit-specific pharmacist

## 2021-03-03 ENCOUNTER — Other Ambulatory Visit (HOSPITAL_COMMUNITY): Payer: Medicare Other

## 2021-03-03 ENCOUNTER — Telehealth: Payer: Self-pay | Admitting: Internal Medicine

## 2021-03-03 DIAGNOSIS — R531 Weakness: Secondary | ICD-10-CM | POA: Diagnosis not present

## 2021-03-03 DIAGNOSIS — E039 Hypothyroidism, unspecified: Secondary | ICD-10-CM | POA: Diagnosis not present

## 2021-03-03 DIAGNOSIS — R001 Bradycardia, unspecified: Secondary | ICD-10-CM | POA: Diagnosis not present

## 2021-03-03 DIAGNOSIS — N183 Chronic kidney disease, stage 3 unspecified: Secondary | ICD-10-CM | POA: Diagnosis not present

## 2021-03-03 DIAGNOSIS — E1129 Type 2 diabetes mellitus with other diabetic kidney complication: Secondary | ICD-10-CM | POA: Diagnosis not present

## 2021-03-03 DIAGNOSIS — I10 Essential (primary) hypertension: Secondary | ICD-10-CM | POA: Diagnosis not present

## 2021-03-03 LAB — BASIC METABOLIC PANEL
Anion gap: 7 (ref 5–15)
BUN: 18 mg/dL (ref 8–23)
CO2: 31 mmol/L (ref 22–32)
Calcium: 9 mg/dL (ref 8.9–10.3)
Chloride: 100 mmol/L (ref 98–111)
Creatinine, Ser: 1.4 mg/dL — ABNORMAL HIGH (ref 0.61–1.24)
GFR, Estimated: 51 mL/min — ABNORMAL LOW (ref >60)
Glucose, Bld: 126 mg/dL — ABNORMAL HIGH (ref 70–99)
Potassium: 3.7 mmol/L (ref 3.5–5.1)
Sodium: 138 mmol/L (ref 135–145)

## 2021-03-03 LAB — GLUCOSE, CAPILLARY
Glucose-Capillary: 149 mg/dL — ABNORMAL HIGH (ref 70–99)
Glucose-Capillary: 178 mg/dL — ABNORMAL HIGH (ref 70–99)

## 2021-03-03 LAB — MAGNESIUM: Magnesium: 2 mg/dL (ref 1.7–2.4)

## 2021-03-03 MED ORDER — FUROSEMIDE 40 MG PO TABS
40.0000 mg | ORAL_TABLET | Freq: Two times a day (BID) | ORAL | Status: DC
  Filled 2021-03-03: qty 1

## 2021-03-03 MED ORDER — FUROSEMIDE 10 MG/ML IJ SOLN
60.0000 mg | Freq: Once | INTRAMUSCULAR | Status: AC
  Administered 2021-03-03: 60 mg via INTRAVENOUS
  Filled 2021-03-03: qty 6

## 2021-03-03 MED ORDER — HEPARIN SOD (PORK) LOCK FLUSH 100 UNIT/ML IV SOLN
500.0000 [IU] | INTRAVENOUS | Status: AC | PRN
  Administered 2021-03-03: 500 [IU]
  Filled 2021-03-03: qty 5

## 2021-03-03 MED ORDER — AMLODIPINE BESYLATE 10 MG PO TABS: 10.0000 mg | ORAL_TABLET | Freq: Every day | ORAL | 0 refills | Status: DC

## 2021-03-03 MED ORDER — POTASSIUM CHLORIDE CRYS ER 20 MEQ PO TBCR
40.0000 meq | EXTENDED_RELEASE_TABLET | Freq: Every day | ORAL | Status: DC
  Administered 2021-03-03: 40 meq via ORAL
  Filled 2021-03-03: qty 2

## 2021-03-03 MED ORDER — SPIRONOLACTONE 25 MG PO TABS: 12.5000 mg | ORAL_TABLET | Freq: Every day | ORAL | 0 refills | Status: DC

## 2021-03-03 MED ORDER — HEPARIN SOD (PORK) LOCK FLUSH 100 UNIT/ML IV SOLN
500.0000 [IU] | INTRAVENOUS | Status: DC | PRN
  Filled 2021-03-03: qty 5

## 2021-03-03 MED ORDER — FUROSEMIDE 20 MG PO TABS: ORAL_TABLET | ORAL | 0 refills | Status: DC

## 2021-03-03 NOTE — Discharge Summary (Signed)
Randall Wilkins KMM:[id number] DOB: 1941/03/17 DOA: 02/26/2021  PCP: [name], MD  Admit date: 02/26/2021  Discharge date: 03/03/2021  Admitted From: Home   Disposition:  Home   Recommendations for Outpatient Follow-up:   Follow up with PCP in 1-2 weeks  PCP Please obtain BMP/CBC, 2 view CXR in 1week,  (see Discharge instructions)   PCP Please follow up on the following pending results: Monitor BMP closely kindly check in 7 days and then again in 2 weeks.  Monitor weight and diuretics closely   Home Health: PT, RN  Equipment/Devices: has 2lit o2  Consultations: Cards/EP Discharge Condition: Stable    CODE STATUS: Full    Diet Recommendation: Heart Healthy low carbohydrate with 1.5 L total fluid restriction per day  Diet Order            Diet Carb Modified Fluid consistency: Thin; Room service appropriate? Yes  Diet effective now                  Chief Complaint  Patient presents with  . Fatigue  . 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 Curt Bears on daily oral chemotherapy, paroxysmal A. fib [place] vas 2 score of 6, CAD s/p CABG, stroke in the past, CKD 3 a baseline creatinine around 1.4, hypertension, dyslipidemia, DM type II, aortic valve stenosis, combined chronic systolic and diastolic heart failure last EF around 45%, left leg weakness after bout of severe rhabdomyolysis wears a brace, memory dysfunction placed on Aricept, he developed severe generalized weakness, difficulty to ambulate, home PT noticed that his heart rate was in high 20s, he came to the hospital  where he was found to have sinus bradycardia along with signs of CHF and admitted to the hospital.                                                                  Hospital Course    1.Symptomatic bradycardia. This could be due to combination of beta-blocker use and recent Aricept use. His TSH is stable, holding offending medications, was seen by cardiology/EP no pacemaker for now, offending medications on hold patient now symptom-free, continue outpatient cardiology follow-up post discharge.  2.Paroxysmal A. fib with [place] vas 2 score of 6. Continue Eliquis for anticoagulation, hold beta-blocker, cardiology on board heart rate improved after offending Meds stopped.  3.CAD s/p CABG. Currently chest pain-free with nonacute EKG, continue Eliquis hold beta-blocker.  4.Aortic stenosis. TTE noted, mild aortic stenosis, cardiology was on board.  5.Acute on Chronic combined systolic and diastolic heart failure last EF around 45%.  Has been adequately  diuresed with high-dose IV Lasix, now symptom free and at baseline, home Lasix dose adjusted added low dose Aldactone as well, continue ACE at home dose, off B.Blocker, PCP to monitor BMP, diuretic dosing weight closely.  6.Rhabdomyolysis induced left leg muscular injury causing left leg weakness. Continue wearing brace when out of bed, PT OT.  7.Previous history of stroke. Supportive care. Continue Eliquis.  8.Stage IV non-small cell lung cancer -continue oral chemotherapy, Dr. Julien Nordmann has been informed.  9.Obesity with OSA. Follow with PCP for weight loss BMI 34, nighttime oxygen to be continued when he sleeps.  10.CKD stage IIIa. Creatinine appears to be close to baseline. Will monitor.  11. Dehydration. Resolved, has been hydrated with IVF.  13.BPH. Continue home dose alpha-blocker.  14.Hypothyroidism. stable TSH continue home dose Synthroid.  15.Hypertension. Placed on Norvasc in place of  B.Blocker.   16.Early dementia. Hold Aricept due to bradycardia, will need outpt Neuro follow up for al;ternatives.  17.Hypokalemia.  Replaced and stable.    18. DM type II.-Continue home regimen follow with PCP in 1 week.   Discharge diagnosis     Active Problems:   Symptomatic bradycardia    Discharge instructions    Discharge Instructions    Discharge instructions   Complete by: As directed    Follow with Primary MD [name], MD in 7 days   Get CBC, CMP, 2 view Chest X ray -  checked next visit within 1 week by Primary MD    Activity: As tolerated with Full fall precautions use walker/cane & assistance as needed  Disposition Home   Diet: Heart Healthy Low Carb, 1.5lit/day fluid restriction  Check your Weight same time everyday, if you gain over 2 pounds, or you develop in leg swelling, experience more shortness of breath or chest pain, call your Primary MD immediately. Follow Cardiac Low Salt Diet and 1.5 lit/day fluid restriction.  Accuchecks 4 times/day, Once in AM empty stomach and then before each meal. Log in all results and show them to your Prim.MD in 3 days. If any glucose reading is under 80 or above 300 call your Prim MD immidiately. Follow Low glucose instructions for glucose under 80 as instructed.  Special Instructions: If you have smoked or chewed Tobacco  in the last 2 yrs please stop smoking, stop any regular Alcohol  and or any Recreational drug use.  On your next visit with your primary care physician please Get Medicines reviewed and adjusted.  Please request your Prim.MD to go over all Hospital Tests and Procedure/Radiological results at the follow up, please get all Hospital records sent to your Prim MD by signing hospital release before you go home.  If you experience worsening of your admission symptoms, develop shortness of breath, life threatening emergency, suicidal or homicidal thoughts you must seek medical attention  immediately by calling 911 or calling your MD immediately  if symptoms less severe.  You Must read complete instructions/literature along with all the possible adverse reactions/side effects for all the Medicines you take and that have been prescribed to you. Take any new Medicines after you have completely understood and accpet all the possible adverse reactions/side effects.   Increase activity slowly   Complete by: As directed    No wound care   Complete by: As directed       Discharge Medications   Allergies as of 03/03/2021      Reactions   Actos [pioglitazone] Swelling   Metformin And Related Nausea Only   3.16.2022  pt is currently taking this medication at home   Niaspan [niacin Er] Itching, Rash   Wound Dressing Adhesive Rash      Medication List    STOP taking these medications   donepezil 10 MG tablet Commonly known as: ARICEPT   metoprolol succinate 25 MG 24 hr tablet Commonly known as: TOPROL-XL     TAKE these medications   albuterol 108 (90 Base) MCG/ACT inhaler Commonly known as: VENTOLIN HFA Inhale 2 puffs into the lungs every 6 (six) hours as needed for wheezing or shortness of breath.   allopurinol 100 MG tablet Commonly known as: ZYLOPRIM Take 1 tablet (100 mg total) by mouth daily.   amitriptyline 75 MG tablet Commonly known as: ELAVIL Take 75 mg by mouth at bedtime.   amLODipine 10 MG tablet Commonly known as: NORVASC Take 1 tablet (10 mg total) by mouth daily.   apixaban 2.5 MG Tabs tablet Commonly known as: ELIQUIS Take 1 tablet (2.5 mg total) by mouth 2 (two) times daily.   Contour Next Monitor w/Device Kit 1 Device by Does not apply route 3 (three) times daily. Use to check blood sugars 3 times daily. Dx Code E13.9   Contour Next Test test strip Generic drug: glucose blood USE 1 STRIP TO CHECK GLUCOSE 4 TIMES DAILY   Easy Comfort Pen Needles 33G X 4 MM Misc Generic drug: Insulin Pen Needle 1 each by Does not apply route See admin  instructions. Use to inject insulin 5 times daily.   furosemide 20 MG tablet Commonly known as: LASIX Take 60 mg in the morning and 40 mg What changed:   medication strength  how much to take  how to take this  when to take this  additional instructions   HumaLOG 100 UNIT/ML cartridge Generic drug: insulin lispro Inject 5-15 Units into the skin 3 (three) times daily with meals. Sliding scale.   levothyroxine 75 MCG tablet Commonly known as: SYNTHROID Take 75 mcg by mouth daily before breakfast.   lidocaine 2 % solution Commonly known as: XYLOCAINE Use as directed 15 mLs in the mouth or throat as needed for mouth pain.   lidocaine-prilocaine cream Commonly known as: EMLA Apply 1 application topically as needed. Apply a teaspoon over port site at least 1 hour prior to lab appt. Do not rub in and cover with plastic wrap.   lisinopril 10 MG tablet Commonly known as: ZESTRIL TAKE ONE TABLET DAILY   Lumakras 120 MG Tabs Generic drug: sotorasib Take 960 mg by mouth every morning.   metFORMIN 750 MG 24 hr tablet Commonly known as: GLUCOPHAGE-XR TAKE 1 TABLET WITH DINNER   ondansetron 4 MG tablet Commonly known as: ZOFRAN Take 1 tablet (4 mg total) by mouth every 6 (six) hours as needed for nausea.   OXYGEN Inhale 2 L into the lungs. At bedtime and during the day prn   potassium chloride SA 20 MEQ tablet Commonly known as: KLOR-CON Take 20 mEq by mouth 2 (two) times daily.   prednisoLONE acetate 1 % ophthalmic suspension Commonly known as: PRED FORTE Place 1 drop into the right eye 4 (four) times daily.   prochlorperazine 10 MG tablet Commonly known as: COMPAZINE Take 1 tablet (10 mg total) by mouth every 6 (six) hours as needed for nausea or vomiting.   rosuvastatin 20 MG tablet Commonly known as: CRESTOR Take 20 mg by mouth daily.   Spiriva Respimat 2.5 MCG/ACT Aers Generic drug: Tiotropium Bromide Monohydrate Inhale 2 puffs into the lungs  daily.    spironolactone 25 MG tablet Commonly known as: Aldactone Take 0.5 tablets (12.5 mg total) by mouth daily.   tamsulosin 0.4 MG Caps capsule Commonly known as: FLOMAX Take 0.4 mg by mouth at bedtime.   Tyler Aas FlexTouch 200 UNIT/ML FlexTouch Pen Generic drug: insulin degludec Inject 24 Units into the skin at bedtime. What changed: how much to take   vitamin B-12 1000 MCG tablet Commonly known as: CYANOCOBALAMIN Take 1,000 mcg by mouth daily.        Follow-up Information    [name], MD. Schedule an appointment as soon as possible for a visit today.   Specialty: Family Medicine Why:   Contact information: [address] [place] [address] [phone]        Jerline Pain, MD. Schedule an appointment as soon as possible for a visit in 1 week(s).   Specialty: Cardiology Why: CHF Contact information: [address] [place] [address] [phone]               Major procedures and Radiology Reports - PLEASE review detailed and final reports thoroughly  -        DG Chest 2 View  Result Date: 02/26/2021 CLINICAL DATA:  Weakness on home oxygen. EXAM: CHEST - 2 VIEW COMPARISON:  November 05, 2020 FINDINGS: RIGHT IJ Port-A-Cath in situ. Post median sternotomy for CABG. Heart size remains enlarged. Opacity at the RIGHT lung base and LEFT mid chest as on the prior study. Potentially diminished effusion in the RIGHT chest compared to previous imaging. On limited assessment no acute skeletal process. IMPRESSION: 1. Opacity at the RIGHT lung base and LEFT mid chest, similar to the prior study. Patient with bilateral lung masses. No definite acute process. Difficult to exclude superimposed pneumonia given pattern of disease. 2. Potentially diminished RIGHT pleural effusion. Electronically Signed   By: Zetta Bills M.D.   On: 02/26/2021 13:28   ECHOCARDIOGRAM COMPLETE  Result Date: 02/27/2021    ECHOCARDIOGRAM REPORT   Patient  Name:   Braxtyn S Wilkins Date of Exam: 02/27/2021 Medical Rec #:  [id number]      Height:       67.0 in Accession #:    [id number]     Weight:       222.0 lb Date of Birth:  [birth year]      BSA:          2.114 m Patient Age:    2 years       BP:           156/74 mmHg Patient Gender: M              HR:           57 bpm. Exam Location:  Inpatient Procedure: 2D Echo Indications:    dyspnea  History:        Patient has prior history of Echocardiogram examinations, most                 recent 03/22/2020. CAD, Abnormal ECG, Arrythmias:symptomatic                 bradycardia; Risk Factors:Diabetes, Dyslipidemia and Sleep                 Apnea.  Sonographer:    Johny Chess Referring Phys: [place]  1. Left ventricular ejection fraction, by estimation, is 40 to 45%. The left ventricle has mildly decreased function. The  left ventricle demonstrates global hypokinesis. There is mild asymmetric left ventricular hypertrophy. Left ventricular diastolic parameters are indeterminate.  2. Right ventricular systolic function was not well visualized. The right ventricular size is not well visualized. There is severely elevated pulmonary artery systolic pressure. The estimated right ventricular systolic pressure is 61.5 mmHg.  3. Right atrial size was mildly dilated.  4. The mitral valve is grossly normal. Trivial mitral valve regurgitation.  5. The aortic valve is grossly normal. There is mild calcification of the aortic valve. There is mild thickening of the aortic valve. Aortic valve regurgitation is trivial. Mild aortic valve stenosis. FINDINGS  Left Ventricle: Left ventricular ejection fraction, by estimation, is 40 to 45%. The left ventricle has mildly decreased function. The left ventricle demonstrates global hypokinesis. The left ventricular internal cavity size was normal in size. There is  mild asymmetric left ventricular hypertrophy. Left ventricular diastolic parameters are indeterminate. Right  Ventricle: The right ventricular size is not well visualized. Right vetricular wall thickness was not well visualized. Right ventricular systolic function was not well visualized. There is severely elevated pulmonary artery systolic pressure. The tricuspid regurgitant velocity is 4.33 m/s, and with an assumed right atrial pressure of 3 mmHg, the estimated right ventricular systolic pressure is 37.9 mmHg. Left Atrium: Left atrial size was normal in size. Right Atrium: Right atrial size was mildly dilated. Pericardium: There is no evidence of pericardial effusion. Mitral Valve: The mitral valve is grossly normal. Trivial mitral valve regurgitation. Tricuspid Valve: The tricuspid valve is normal in structure. Tricuspid valve regurgitation is mild. Aortic Valve: The aortic valve is grossly normal. There is mild calcification of the aortic valve. There is mild thickening of the aortic valve. Aortic valve regurgitation is trivial. Mild aortic stenosis is present. Aortic valve mean gradient measures 16.0 mmHg. Aortic valve peak gradient measures 30.3 mmHg. Aortic valve area, by VTI measures 0.89 cm. Pulmonic Valve: The pulmonic valve was normal in structure. Pulmonic valve regurgitation is not visualized. Aorta: The aortic root and ascending aorta are structurally normal, with no evidence of dilitation. IAS/Shunts: The atrial septum is grossly normal.  LEFT VENTRICLE PLAX 2D LVIDd:         5.10 cm LVIDs:         4.00 cm LV PW:         1.30 cm LV IVS:        1.10 cm LVOT diam:     2.00 cm LV SV:         54 LV SV Index:   25 LVOT Area:     3.14 cm  IVC IVC diam: 1.90 cm LEFT ATRIUM             Index       RIGHT ATRIUM           Index LA diam:        4.40 cm 2.08 cm/m  RA Area:     19.10 cm LA Vol (A2C):   67.3 ml 31.84 ml/m RA Volume:   49.50 ml  23.42 ml/m LA Vol (A4C):   64.0 ml 30.27 ml/m LA Biplane Vol: 67.1 ml 31.74 ml/m  AORTIC VALVE AV Area (Vmax):    0.93 cm AV Area (Vmean):   0.95 cm AV Area (VTI):      0.89 cm AV Vmax:           275.33 cm/s AV Vmean:          180.667 cm/s AV VTI:  0.603 m AV Peak Grad:      30.3 mmHg AV Mean Grad:      16.0 mmHg LVOT Vmax:         81.77 cm/s LVOT Vmean:        54.900 cm/s LVOT VTI:          0.171 m LVOT/AV VTI ratio: 0.28  AORTA Ao Root diam: 3.00 cm Ao Asc diam:  3.30 cm MITRAL VALVE                TRICUSPID VALVE MV Area (PHT): 5.75 cm     TR Peak grad:   75.0 mmHg MV Decel Time: 132 msec     TR Vmax:        433.00 cm/s MV E velocity: 136.00 cm/s MV A velocity: 50.80 cm/s   SHUNTS MV E/A ratio:  2.68         Systemic VTI:  0.17 m                             Systemic Diam: 2.00 cm Mertie Moores MD Electronically signed by Mertie Moores MD Signature Date/Time: 02/27/2021/1:20:59 PM    Final    OCT, Retina - OU - Both Eyes  Result Date: 02/19/2021 Right Eye Quality was good. Scan locations included subfoveal. Central Foveal Thickness: 335. Progression has no prior data. Findings include abnormal foveal contour. Left Eye Quality was good. Scan locations included subfoveal. Central Foveal Thickness: 314. Progression has been stable. Findings include epiretinal membrane. Notes Improving OD, 1 week post vitrectomy membrane peel OS with minor epiretinal membrane in the temporal aspect of the macula, no foveal distortion   Micro Results     Recent Results (from the past 240 hour(s))  Respiratory (~20 pathogens) panel by PCR     Status: None   Collection Time: 02/26/21  4:20 PM   Specimen: Nasopharyngeal Swab; Respiratory  Result Value Ref Range Status   Adenovirus NOT DETECTED NOT DETECTED Final   Coronavirus 229E NOT DETECTED NOT DETECTED Final    Comment: (NOTE) The Coronavirus on the Respiratory Panel, DOES NOT test for the novel  Coronavirus (2019 nCoV)    Coronavirus HKU1 NOT DETECTED NOT DETECTED Final   Coronavirus NL63 NOT DETECTED NOT DETECTED Final   Coronavirus OC43 NOT DETECTED NOT DETECTED Final   Metapneumovirus NOT DETECTED NOT DETECTED  Final   Rhinovirus / Enterovirus NOT DETECTED NOT DETECTED Final   Influenza A NOT DETECTED NOT DETECTED Final   Influenza B NOT DETECTED NOT DETECTED Final   Parainfluenza Virus 1 NOT DETECTED NOT DETECTED Final   Parainfluenza Virus 2 NOT DETECTED NOT DETECTED Final   Parainfluenza Virus 3 NOT DETECTED NOT DETECTED Final   Parainfluenza Virus 4 NOT DETECTED NOT DETECTED Final   Respiratory Syncytial Virus NOT DETECTED NOT DETECTED Final   Bordetella pertussis NOT DETECTED NOT DETECTED Final   Bordetella Parapertussis NOT DETECTED NOT DETECTED Final   Chlamydophila pneumoniae NOT DETECTED NOT DETECTED Final   Mycoplasma pneumoniae NOT DETECTED NOT DETECTED Final    Comment: Performed at [facility], [address], [place], [place] [address]  SARS CORONAVIRUS 2 (TAT 6-24 HRS) Nasopharyngeal Nasopharyngeal Swab     Status: None   Collection Time: 02/27/21  7:42 PM   Specimen: Nasopharyngeal Swab  Result Value Ref Range Status   SARS Coronavirus 2 NEGATIVE NEGATIVE Final    Comment: (NOTE) SARS-CoV-2 target nucleic acids are NOT  DETECTED.  The SARS-CoV-2 RNA is generally detectable in upper and lower respiratory specimens during the acute phase of infection. Negative results do not preclude SARS-CoV-2 infection, do not rule out co-infections with other pathogens, and should not be used as the sole basis for treatment or other patient management decisions. Negative results must be combined with clinical observations, patient history, and epidemiological information. The expected result is Negative.  Fact Sheet for Patients: SugarRoll.be  Fact Sheet for Healthcare Providers: https://www.woods-mathews.com/  This test is not yet approved or cleared by the [place] FDA and  has been authorized for detection and/or diagnosis of SARS-CoV-2 by FDA under an Emergency Use Authorization (EUA). This EUA will remain  in effect (meaning  this test can be used) for the duration of the COVID-19 declaration under Se ction 564(b)(1) of the Act, 21 U.S.C. section 360bbb-3(b)(1), unless the authorization is terminated or revoked sooner.  Performed at [facility], [address], [place], [place] [address]     Today   Subjective    Randall Wilkins today has no headache,no chest abdominal pain,no new weakness tingling or numbness, feels much better wants to go home today.     Objective   Blood pressure (!) 166/48, pulse (!) 40, temperature 98.5 F (36.9 C), temperature source Oral, resp. rate 19, height '5\' 7"'  (1.702 m), weight 99.7 kg, SpO2 94 %.   Intake/Output Summary (Last 24 hours) at 03/03/2021 0853 Last data filed at 03/03/2021 0618 Gross per 24 hour  Intake 1081 ml  Output 900 ml  Net 181 ml    Exam  Awake Alert, No new F.N deficits, Normal affect [place].AT,PERRAL Supple Neck,No JVD, No cervical lymphadenopathy appriciated.  Symmetrical Chest wall movement, Good air movement bilaterally, CTAB RRR,No Gallops,Rubs or new Murmurs, No Parasternal Heave +ve B.Sounds, Abd Soft, Non tender, No organomegaly appriciated, No rebound -guarding or rigidity. No Cyanosis, Clubbing or edema, No new Rash or bruise   Data Review   CBC w Diff:  Lab Results  Component Value Date   WBC 5.6 03/01/2021   HGB 11.7 (L) 03/01/2021   HGB 11.8 (L) 02/18/2021   HCT 38.3 (L) 03/01/2021   PLT 127 (L) 03/01/2021   PLT 123 (L) 02/18/2021   LYMPHOPCT 20 03/01/2021   MONOPCT 16 03/01/2021   EOSPCT 2 03/01/2021   BASOPCT 1 03/01/2021    CMP:  Lab Results  Component Value Date   NA 138 03/03/2021   K 3.7 03/03/2021   CL 100 03/03/2021   CO2 31 03/03/2021   BUN 18 03/03/2021   CREATININE 1.40 (H) 03/03/2021   CREATININE 1.44 (H) 02/18/2021   CREATININE 1.30 (H) 04/30/2020   PROT 5.8 (L) 03/02/2021   ALBUMIN 2.7 (L) 03/02/2021   BILITOT 0.8 03/02/2021   BILITOT 0.5 02/18/2021   ALKPHOS 95 03/02/2021   AST 18  03/02/2021   AST 16 02/18/2021   ALT 13 03/02/2021   ALT 10 02/18/2021  .   Total Time in preparing paper work, data evaluation and todays exam - 23 minutes  Lala Lund M.D on 03/03/2021 at 8:53 AM  [facility]

## 2021-03-03 NOTE — Telephone Encounter (Signed)
Pt currently admitted. TOC call for 3/22

## 2021-03-03 NOTE — Care Management Important Message (Signed)
Important Message  Patient Details  Name: Randall Wilkins MRN: [id number] Date of Birth: [birth year]   Medicare Important Message Given:  Yes     [name] 03/03/2021, 10:35 AM

## 2021-03-03 NOTE — Telephone Encounter (Signed)
Rerouting to [facility] pool!

## 2021-03-03 NOTE — Progress Notes (Signed)
Physical Therapy Treatment Patient Details Name: Randall Wilkins MRN: [id number] DOB: 1941-05-17 Today's Date: 03/03/2021    History of Present Illness Pt is an 80 y.o. male admitted 02/26/21 with generalized weakness, difficulty ambulating, and HHPT noted HR in 20s. Workup for bradycardia, afib, PVCs/ventricular bigeminy, CHF. PMH includes afib, HTN, mild AS, CAD s/p CABG, CVA, stage IV non-small cell lung CA (oral chemo), L foot drop.   PT Comments    Pt progressing well with mobility, preparing for likely d/c home today. Tolerated increased ambulation distance for gait training with RW and intermittent min guard for balance. Pt's wife present and supportive. SpO2 down to 85% on 2L O2 with activity; quick return to >/92% with seated rest and cues for pursed lip breathing. If to remain admitted, will continue to follow acutely.   Follow Up Recommendations  Home health PT;Supervision/Assistance - 24 hour     Equipment Recommendations  None recommended by PT    Recommendations for Other Services       Precautions / Restrictions Precautions Precautions: Fall;Other (comment) Precaution Comments: Chronic L foot drop; bradycardia; wears 2L O2 baseline Required Braces or Orthoses: Other Brace Other Brace: L AFO in room Restrictions Weight Bearing Restrictions: No    Mobility  Bed Mobility Overal bed mobility: Needs Assistance Bed Mobility: Supine to Sit     Supine to sit: Min assist     General bed mobility comments: MinA for HHA to elevate trunk    Transfers Overall transfer level: Needs assistance Equipment used: Rolling walker (2 wheeled) Transfers: Sit to/from Stand Sit to Stand: Min guard;Supervision         General transfer comment: Initial min guard for balance standing from EOB, increased effort without armrests; multiple additional sit<>stands from recliner, progressing to supervision for safety  Ambulation/Gait Ambulation/Gait assistance: Min guard Gait  Distance (Feet): 120 Feet Assistive device: Rolling walker (2 wheeled) Gait Pattern/deviations: Step-through pattern;Decreased stride length;Trunk flexed Gait velocity: Decreased   General Gait Details: Pt's AFO donned for ambulation; slow, mostly steady gait with RW and intermittent min guard for balance; cues to maintain closer proximity to RW, cues for activity pacing; SpO2 down to 85% on 2L O2 [place]   Stairs             Wheelchair Mobility    Modified Rankin (Stroke Patients Only)       Balance Overall balance assessment: Needs assistance Sitting-balance support: No upper extremity supported;Feet supported Sitting balance-Leahy Scale: Fair     Standing balance support: Bilateral upper extremity supported;During functional activity Standing balance-Leahy Scale: Poor                              Cognition Arousal/Alertness: Awake/alert Behavior During Therapy: WFL for tasks assessed/performed Overall Cognitive Status: History of cognitive impairments - at baseline                                 General Comments: Pt's wife reports pt has memory deficits at baseline. Pt interacting well and following simple commands appropriately, requiring some repetition due to [facility]. Decreased awareness      Exercises Other Exercises Other Exercises: 5x repeated sit<>stand (heavy reliance on UE support; frequent cues for hand placement)    General Comments General comments (skin integrity, edema, etc.): Wife present and supportive      Pertinent Vitals/Pain Pain Assessment: No/denies pain  Home Living                      Prior Function            PT Goals (current goals can now be found in the care plan section) Progress towards PT goals: Progressing toward goals    Frequency    Min 3X/week      PT Plan Current plan remains appropriate    Co-evaluation              AM-PAC PT "6 Clicks" Mobility   Outcome Measure   Help needed turning from your back to your side while in a flat bed without using bedrails?: None Help needed moving from lying on your back to sitting on the side of a flat bed without using bedrails?: A Little Help needed moving to and from a bed to a chair (including a wheelchair)?: A Little Help needed standing up from a chair using your arms (e.g., wheelchair or bedside chair)?: A Little Help needed to walk in hospital room?: A Little Help needed climbing 3-5 steps with a railing? : A Little 6 Click Score: 19    End of Session Equipment Utilized During Treatment: Gait belt;Oxygen Activity Tolerance: Patient tolerated treatment well Patient left: in chair;with call bell/phone within reach;with chair alarm set;with family/visitor present Nurse Communication: Mobility status PT Visit Diagnosis: Unsteadiness on feet (R26.81);Muscle weakness (generalized) (M62.81)     Time: 7703-4035 PT Time Calculation (min) (ACUTE ONLY): 24 min  Charges:  $Gait Training: 8-22 mins $Therapeutic Exercise: 8-22 mins                     Mabeline Caras, PT, DPT Acute Rehabilitation Services  Pager [phone] Office [place] 03/03/2021, 10:36 AM

## 2021-03-03 NOTE — Telephone Encounter (Signed)
TOC per Roby Lofts on 03/11/21 at 2:15pm with Dr. Debara Pickett

## 2021-03-03 NOTE — Progress Notes (Signed)
Progress Note  Patient Name: Randall Wilkins Date of Encounter: 03/03/2021  [facility] Cardiologist: Pixie Casino, MD   Subjective   No chest pain, no significant shortness of breath.  Family requested I see them before discharge.  Inpatient Medications    Scheduled Meds: . allopurinol  100 mg Oral Daily  . amLODipine  10 mg Oral Daily  . apixaban  2.5 mg Oral BID  . Chlorhexidine Gluconate Cloth  6 each Topical Daily  . feeding supplement  237 mL Oral TID BM  . [START ON 03/04/2021] furosemide  40 mg Oral BID  . insulin aspart  0-5 Units Subcutaneous QHS  . insulin aspart  0-9 Units Subcutaneous TID WC  . insulin glargine  20 Units Subcutaneous QHS  . levothyroxine  75 mcg Oral QAC breakfast  . multivitamin with minerals  1 tablet Oral Daily  . potassium chloride  40 mEq Oral Daily  . sodium chloride flush  10-40 mL Intracatheter Q12H  . sotorasib  960 mg Oral q morning  . tamsulosin  0.4 mg Oral Daily  . umeclidinium-vilanterol  1 puff Inhalation Daily  . vitamin B-12  1,000 mcg Oral Daily   Continuous Infusions:  PRN Meds: acetaminophen **OR** [DISCONTINUED] acetaminophen, albuterol, hydrALAZINE, [DISCONTINUED] ondansetron **OR** ondansetron (ZOFRAN) IV   Vital Signs    Vitals:   03/03/21 0342 03/03/21 0721 03/03/21 0804 03/03/21 1033  BP: (!) 174/48 (!) 166/48  (!) 162/59  Pulse: (!) 34 (!) 40  99  Resp:  19  20  Temp: 98.6 F (37 C) 98.5 F (36.9 C)  98.3 F (36.8 C)  TempSrc: Oral Oral  Oral  SpO2: 100% 100% 94% 100%  Weight: 99.7 kg     Height:        Intake/Output Summary (Last 24 hours) at 03/03/2021 1103 Last data filed at 03/03/2021 1037 Gross per 24 hour  Intake 965 ml  Output 1300 ml  Net -335 ml   Last 3 Weights 03/03/2021 03/02/2021 03/02/2021  Weight (lbs) 219 lb 12.8 oz 220 lb 3.8 oz 222 lb 7.1 oz  Weight (kg) 99.7 kg 99.9 kg 100.9 kg      Telemetry    Previously noted to have AV [name], first-degree AV block.- Personally  Reviewed  ECG    No new - Personally Reviewed  Physical Exam   GEN: No acute distress.  Chronically ill-appearing Neck: No JVD Cardiac:  Irregular, no murmurs, rubs, or gallops.  Respiratory: Clear to auscultation bilaterally. GI: Soft, nontender, non-distended  MS: No edema; No deformity. Neuro:  Nonfocal  Psych: Normal affect   Labs    High Sensitivity Troponin:  No results for input(s): TROPONINIHS in the last 720 hours.    Chemistry Recent Labs  Lab 02/28/21 0416 03/01/21 0302 03/02/21 0241 03/03/21 0328  NA 141 140 137 138  K 3.6 3.7 4.1 3.7  CL 103 102 102 100  CO2 30 30 31 31   GLUCOSE 128* 105* 251* 126*  BUN 18 18 20 18   CREATININE 1.43* 1.54* 1.54* 1.40*  CALCIUM 9.1 9.0 8.9 9.0  PROT 5.9* 5.9* 5.8*  --   ALBUMIN 2.9* 2.9* 2.7*  --   AST 18 25 18   --   ALT 13 11 13   --   ALKPHOS 105 110 95  --   BILITOT 0.7 1.4* 0.8  --   GFRNONAA 50* 45* 45* 51*  ANIONGAP 8 8 4* 7     Hematology Recent Labs  Lab  02/27/21 0439 02/28/21 0416 03/01/21 0302  WBC 6.0 5.7 5.6  RBC 3.91* 3.82* 4.20*  HGB 11.0* 10.6* 11.7*  HCT 36.6* 34.8* 38.3*  MCV 93.6 91.1 91.2  MCH 28.1 27.7 27.9  MCHC 30.1 30.5 30.5  RDW 15.3 15.4 15.4  PLT 137* 129* 127*    BNP Recent Labs  Lab 02/27/21 0439 02/28/21 0417 03/01/21 0302  BNP 1,168.2* 1,430.5* 1,117.2*     DDimer No results for input(s): DDIMER in the last 168 hours.     Patient Profile     80 y.o. male admitted with chronic systolic heart failure, heart block/bradycardia, PVCs  Assessment & Plan    -Seen by electrophysiology, Dr. Lovena Le.  He is off of AV nodal blocking drugs.  No indication for pacemaker insertion.  With lung cancer, hopefully avoid pacemaker.  PVCs appear to be asymptomatic.  -He is now back on oral diuretics.  Agree with plan.  Continue to restrict sodium as well as fluid intake 1.5 L.  Agree with discharge home.   For questions or updates, please contact [place] Please  consult [email or web address] for contact info under        Signed, Candee Furbish, MD  03/03/2021, 11:03 AM

## 2021-03-03 NOTE — Discharge Instructions (Signed)
Follow with Primary MD [name], MD in 7 days   Get CBC, CMP, 2 view Chest X ray -  checked next visit within 1 week by Primary MD    Activity: As tolerated with Full fall precautions use walker/cane & assistance as needed  Disposition Home   Diet: Heart Healthy Low Carb, 1.5lit/day fluid restriction  Check your Weight same time everyday, if you gain over 2 pounds, or you develop in leg swelling, experience more shortness of breath or chest pain, call your Primary MD immediately. Follow Cardiac Low Salt Diet and 1.5 lit/day fluid restriction.  Accuchecks 4 times/day, Once in AM empty stomach and then before each meal. Log in all results and show them to your Prim.MD in 3 days. If any glucose reading is under 80 or above 300 call your Prim MD immidiately. Follow Low glucose instructions for glucose under 80 as instructed.  Special Instructions: If you have smoked or chewed Tobacco  in the last 2 yrs please stop smoking, stop any regular Alcohol  and or any Recreational drug use.  On your next visit with your primary care physician please Get Medicines reviewed and adjusted.  Please request your Prim.MD to go over all Hospital Tests and Procedure/Radiological results at the follow up, please get all Hospital records sent to your Prim MD by signing hospital release before you go home.  If you experience worsening of your admission symptoms, develop shortness of breath, life threatening emergency, suicidal or homicidal thoughts you must seek medical attention immediately by calling 911 or calling your MD immediately  if symptoms less severe.  You Must read complete instructions/literature along with all the possible adverse reactions/side effects for all the Medicines you take and that have been prescribed to you. Take any new Medicines after you have completely understood and accpet all the possible adverse reactions/side effects.

## 2021-03-03 NOTE — Progress Notes (Signed)
OT Cancellation Note  Patient Details Name: Randall Wilkins MRN: [id number] DOB: 07-Sep-1941   Cancelled Treatment:    Reason Eval/Treat Not Completed: Fatigue/lethargy limiting ability to participate;Other (comment) Pt reports fatigue from not getting enough rest last night,  will check back as time allows.  [name], COTA/L Acute Rehabilitation Services [phone]   [name] 03/03/2021, 8:22 AM

## 2021-03-04 DIAGNOSIS — I872 Venous insufficiency (chronic) (peripheral): Secondary | ICD-10-CM | POA: Diagnosis not present

## 2021-03-04 DIAGNOSIS — E785 Hyperlipidemia, unspecified: Secondary | ICD-10-CM | POA: Diagnosis not present

## 2021-03-04 DIAGNOSIS — E1142 Type 2 diabetes mellitus with diabetic polyneuropathy: Secondary | ICD-10-CM | POA: Diagnosis not present

## 2021-03-04 DIAGNOSIS — E1122 Type 2 diabetes mellitus with diabetic chronic kidney disease: Secondary | ICD-10-CM | POA: Diagnosis not present

## 2021-03-04 DIAGNOSIS — M109 Gout, unspecified: Secondary | ICD-10-CM | POA: Diagnosis not present

## 2021-03-04 DIAGNOSIS — I130 Hypertensive heart and chronic kidney disease with heart failure and stage 1 through stage 4 chronic kidney disease, or unspecified chronic kidney disease: Secondary | ICD-10-CM | POA: Diagnosis not present

## 2021-03-04 DIAGNOSIS — I252 Old myocardial infarction: Secondary | ICD-10-CM | POA: Diagnosis not present

## 2021-03-04 DIAGNOSIS — I350 Nonrheumatic aortic (valve) stenosis: Secondary | ICD-10-CM | POA: Diagnosis not present

## 2021-03-04 DIAGNOSIS — C3492 Malignant neoplasm of unspecified part of left bronchus or lung: Secondary | ICD-10-CM | POA: Diagnosis not present

## 2021-03-04 DIAGNOSIS — I480 Paroxysmal atrial fibrillation: Secondary | ICD-10-CM | POA: Diagnosis not present

## 2021-03-04 DIAGNOSIS — N1831 Chronic kidney disease, stage 3a: Secondary | ICD-10-CM | POA: Diagnosis not present

## 2021-03-04 DIAGNOSIS — I2510 Atherosclerotic heart disease of native coronary artery without angina pectoris: Secondary | ICD-10-CM | POA: Diagnosis not present

## 2021-03-04 DIAGNOSIS — C3491 Malignant neoplasm of unspecified part of right bronchus or lung: Secondary | ICD-10-CM | POA: Diagnosis not present

## 2021-03-04 DIAGNOSIS — F0280 Dementia in other diseases classified elsewhere without behavioral disturbance: Secondary | ICD-10-CM | POA: Diagnosis not present

## 2021-03-04 DIAGNOSIS — I5042 Chronic combined systolic (congestive) and diastolic (congestive) heart failure: Secondary | ICD-10-CM | POA: Diagnosis not present

## 2021-03-04 DIAGNOSIS — E039 Hypothyroidism, unspecified: Secondary | ICD-10-CM | POA: Diagnosis not present

## 2021-03-05 NOTE — Telephone Encounter (Signed)
Mickel Baas is returning Randall Wilkins's call. Please advise.

## 2021-03-05 NOTE — Telephone Encounter (Signed)
Patient contacted regarding discharge from [facility] on 03/03/21.  Patient understands to follow up with provider [place] on 03/11/21 at 2:15 pm at [facility]. Patient understands discharge instructions? ye Patient understands medications and regiment? yes Patient understands to bring all medications to this visit? yes  Wife wanted to inquire if amlodipine of spironolactone can cause itching? She report last night and this morning pt c/o itching and scratching.  Will forward to Pharm D for advice.

## 2021-03-05 NOTE — Telephone Encounter (Signed)
Wife updated and verbalized understanding.

## 2021-03-05 NOTE — Telephone Encounter (Signed)
Left message to call back  

## 2021-03-05 NOTE — Telephone Encounter (Signed)
Looks like patient was on amlodipine in the past. Spironolactone may be causing itching. Okay to hold spironolactone for now, and discuss on follow up.

## 2021-03-08 DIAGNOSIS — C3491 Malignant neoplasm of unspecified part of right bronchus or lung: Secondary | ICD-10-CM | POA: Diagnosis not present

## 2021-03-10 ENCOUNTER — Telehealth: Payer: Self-pay | Admitting: Internal Medicine

## 2021-03-10 DIAGNOSIS — I5043 Acute on chronic combined systolic (congestive) and diastolic (congestive) heart failure: Secondary | ICD-10-CM | POA: Diagnosis not present

## 2021-03-10 DIAGNOSIS — E1149 Type 2 diabetes mellitus with other diabetic neurological complication: Secondary | ICD-10-CM | POA: Diagnosis not present

## 2021-03-10 DIAGNOSIS — I4891 Unspecified atrial fibrillation: Secondary | ICD-10-CM | POA: Diagnosis not present

## 2021-03-10 DIAGNOSIS — N183 Chronic kidney disease, stage 3 unspecified: Secondary | ICD-10-CM | POA: Diagnosis not present

## 2021-03-10 NOTE — Telephone Encounter (Signed)
Mary from Dr. Lewayne Bunting office is calling to get a copy of EKG report patient had done. Wants to know if the they could get clearance for dental procedure and for permission to use anesthetic for numbing epinephrine. Please call back

## 2021-03-10 NOTE — Telephone Encounter (Signed)
Spoke with Stanton Kidney at Dr. Lewayne Bunting office who states that the patient is having a dental procedure and wanted to get a copy of the patient's last EKG faxed over to [phone]. She also has questions about using epinephrine for numbing. She will speak with Dr. Sandra Cockayne and send over a pre-op clearance request if she feels necessary. Will fax most recent EKG over.

## 2021-03-10 NOTE — Telephone Encounter (Signed)
Mary from [place] called back to verify the patient dob is Feb 03, 1941

## 2021-03-11 ENCOUNTER — Ambulatory Visit (INDEPENDENT_AMBULATORY_CARE_PROVIDER_SITE_OTHER): Payer: Medicare Other | Admitting: Internal Medicine

## 2021-03-11 ENCOUNTER — Other Ambulatory Visit: Payer: Self-pay

## 2021-03-11 ENCOUNTER — Telehealth: Payer: Self-pay | Admitting: *Deleted

## 2021-03-11 ENCOUNTER — Encounter: Payer: Self-pay | Admitting: Internal Medicine

## 2021-03-11 VITALS — BP 149/84 | HR 73 | Ht 67.0 in | Wt 229.0 lb

## 2021-03-11 DIAGNOSIS — C3491 Malignant neoplasm of unspecified part of right bronchus or lung: Secondary | ICD-10-CM | POA: Diagnosis not present

## 2021-03-11 DIAGNOSIS — I4892 Unspecified atrial flutter: Secondary | ICD-10-CM

## 2021-03-11 DIAGNOSIS — Z951 Presence of aortocoronary bypass graft: Secondary | ICD-10-CM | POA: Diagnosis not present

## 2021-03-11 DIAGNOSIS — R0602 Shortness of breath: Secondary | ICD-10-CM | POA: Diagnosis not present

## 2021-03-11 MED ORDER — APIXABAN 5 MG PO TABS: 5.0000 mg | ORAL_TABLET | Freq: Two times a day (BID) | ORAL | 11 refills | Status: DC

## 2021-03-11 NOTE — Telephone Encounter (Signed)
MD note from 03/11/21 routed to requesting provider via epic fax function

## 2021-03-11 NOTE — Telephone Encounter (Signed)
   Primary Cardiologist: Pixie Casino, MD  Chart reviewed as part of pre-operative protocol coverage.  Pre-op covering staff: - The patient has an appointment today with Dr. Debara Pickett at 2:15pm. [place] Parents add "pre-op clearance" to the appointment notes so provider is aware.   If applicable, this message will also be routed to pharmacy pool and/or primary cardiologist for input on holding anticoagulant/antiplatelet agent as requested below so that this information is available to the clearing provider at time of patient's appointment.   Kathyrn Drown, NP  03/11/2021, 9:36 AM

## 2021-03-11 NOTE — Progress Notes (Signed)
OFFICE NOTE  Chief Complaint:  Dyspnea  Primary Care Physician: [name], MD  HPI:  Randall Wilkins is a pleasant 80 year old male patient who was formerly followed by Dr. Rollene Fare. He is here to establish cardiac care with [place] today. He has a history of obesity, multiple comorbidities including insulin-dependent diabetes, diabetic peripheral neuropathy, diabetic nephropathy, venous insufficiency and peripheral neuropathy. He also has obstructive sleep apnea on CPAP and coronary artery disease. He unfortunately suffered a left parietal stroke with small lacunar infarcts noted on MRI in 2013. He is a left foot drop from an L4/L5 neuropathy and wears a brace for this. From a cardiac standpoint he had a large non-drug-eluting stent placed in the RCA in 2005. He has had a stent placed at 89 to a diagonal which was noted to be patent. He's had some problems with dizziness which is improved. He [place] as large to medium secondary to neuropathy and venous insufficiency. Unfortunately not been able to lose a lot of weight due to his difficulty in ambulating and painful neuropathy.  He denies any cardiac chest pain or worsening shortness of breath.  [name] for a six-month followup. He is having no complaints. He does report some lower extremity swelling which is stable. He has mild aortic stenosis which is been stable as well. His last echo was in August 2014. He denies any chest pain or worsening shortness of breath. He recently had lab work to Dr. Eugenio Hoes office which we will obtain.  I saw [name] today for hospital follow-up. He is reportedly doing much better. He has more energy and no significant chest discomfort. Between our last appointment see presented with a urinary tract infection and ultimately had chest pain and profuse diaphoresis. He was found to have elevated troponin and underwent cardiac catheterization. This demonstrated the following:  Coronary  angiography: Coronary dominance: right  Left mainstem: 95% distal left main stenosis.  Left anterior descending (LAD): There is 95% ostial LAD stenosis. The LAD is a large vessel. The LAD and diagonal otherwise are without significant disease.  There is a large ramus intermediate branch with 99% ostial stenosis.  Left circumflex (LCx): 100% ostial occlusion with faint left to left collaterals.  Right coronary artery (RCA): the RCA is a large dominant branch. The stent in the proximal vessel is widely patent. There is a 60% stenosis in the mid PDA. The third PLOM is moderate in size with a long 90-95% stenosis prior to distal arborization.   Subsequently he underwent emergent coronary artery bypass grafting with a LIMA to LAD and SVG to OM branch. He is noted to have mild aortic sclerosis versus stenosis, however the valve was not severe enough to be addressed. Since discharge he is more active and is starting cardiac rehabilitation today.  [name] for follow-up. Overall he is feeling well and continues to improve in his ambulation and stamina. He denies any chest pain or shortness of breath. He is rehabilitating well. Recent laboratory work shows excellent cholesterol control and total cholesterol 130, HDL 42 LDL 61 and triglycerides 137. His only concern is the cost of medications.  I saw [name] today in the office. He denies any chest pain or worsening shortness of breath. Unfortunately he's had about several weeks of diarrhea which started when he went to the [facility] area. It was not felt to be infectious any seeing a gastroenterologist about this. Otherwise he is without cardiac complaints.  Blood pressure today was low at 114/52. He's managed to lose only a couple of pounds but generally his appetite stools been pretty good despite the diarrhea. He is planning on having an upcoming injection and I received a request to hold his Plavix for 5 days prior to that. He  is currently holding it will have his injection on Monday. Based on this I reviewed his records and I do not see a clear ongoing indication for the Plavix in addition to aspirin. Now that he's been surgically revascularized.   03/04/2017  [name] for follow-up. Recently he's had some dizziness and apparently had lower blood pressure. He backed off on his lisinopril to 2.5 mg daily, or at least he says he is taking a half tablet. Will try to figure out what the dose was. EKG today shows sinus bradycardia with either a first degree AV block of great significance or possibly atrial fibrillation. It's difficult to see consistent P waves with normal PP intervals. The RR intervals are slightly irregular. He has had sinus with a long first-degree AV block of 340 ms in the past. This is important to determine as he has had prior stroke and would necessitate anticoagulation.  03/23/2018  [name] for follow-up.  He has no specific complaints.  He continues to have a little instability with his gait.  He is using a cane today but has a walker at home.  Recently he has had the best blood sugars that he has had in a while.  He started seeing Dr. Dwyane Dee, who added Victoza.  He is also on [place] and Humalog.  He was in a clinical trial with Jardiance, and is to be considered given his history of coronary disease and diabetes for mortality benefit.  I will defer to Dr. Dwyane Dee.  Blood pressure is well controlled today.  Weight remains excessive.  EKG personally reviewed shows sinus rhythm with PVCs for which he is unaware of.  02/07/2019  Leigh returns today for follow-up.  He is done well and has no new complaints.  Recently seen by Dr. Claiborne Billings and adjustments were made to his CPAP.  He is reportedly 100% compliant.  He was noted to have a type I winky block on EKG a couple weeks ago and his Toprol-XL was reduced from 37.5 to 25 mg.  He seems to be tolerating this however blood pressures  accordingly gone up.  Blood pressure today was 155/71 and home blood pressures are in the 150s.  03/13/2020  Deran is seen today in follow-up.  He is accompanied by his wife.  He scheduled to see Dr. Claiborne Billings in a couple weeks.  Recently he has been struggling with shortness of breath and fatigue.  He saw his primary care provider and diagnosed with a pneumonia.  He has been on antibiotics and does seem to be improving.  He has somewhat of a productive cough.  An EKG was performed today and shows atrial fibrillation controlled ventricular response.  This is newly documented.  He does not have known history of this although had had a prior stroke.  He is on low-dose aspirin.  05/03/2020  Allie is seen today in follow-up.  He just got back from the beach.  He remains in A. fib.  Fortunately his pneumonia is not resolving.  Repeat imaging still suggest multifocal infiltrates and his PCP apparently is going to refer him to pulmonary.  I suspect he may end up getting bronchoscopy or other  procedures.  This will complicate things if he were to have a cardioversion, particularly I would not want to stop his anticoagulation to allow for those procedures within a month after it.  At this point he remains in A. fib today but is rate controlled.  He has been fatigued again I think this is related his A. fib and his pneumonias.  He is essentially nonambulatory and difficult for his wife to push around.  They have asked about a powered wheelchair.  07/08/2020  Myshawn returns today for follow-up.  He appears to be an sinus rhythm with Mobitz 1 AV block.  He had this previously and has had some intermittent A. fib however there is no indication for cardioversion.  He is anticoagulated on Eliquis.  His breathing is worsened and recently had a super D CT scan of the chest ordered by Dr. Lamonte Sakai.  Unfortunate this shows a large 5 cm cavitary lung mass in the right lower lobe as well as some persistent lung mass in the left upper  lobe.  Is not clear to me whether this is tumor versus a possible pneumonic process.  He also reported that they had had some remodeling done in her house and they found extensive amounts of mold in the kitchen.  02/06/2021  Mihir is seen today in the office for follow-up.  He has had some progressive dyspnea.  EKG today shows what appears to be a long first-degree AV block, probably sinus rhythm with a Mobitz 1.  Looking back he had an EKG in October which is read as A. fib but appears to be regular with a long first-degree AV block.  His initial diagnosis of A. fib I think came from me earlier last year with an EKG in the office that was questionable for A. fib but also probably showed a long first-degree AV block.  Nevertheless he remains on Eliquis 2.5 mg twice daily.  His dyspnea has progressed somewhat.  He saw pulmonary and was started on Spiriva which may have improved a little.  He also has extensive lung cancer which may be causing his dyspnea.  He has reported a little bit of leg swelling.  Echo last year showed an EF of 48% with mild to moderate aortic stenosis.  03/11/2021  Javante is seen today for a cardiac clearance.  He is supposed to undergo periodontal surgery because apparently he has a cavity or abscess which may affect his bridge.  Apparently this is more urgent.  He would have to stop his Eliquis 2 days prior to the procedure and should start afterwards.  According to our pharmacist it was noted that his creatinine has improved slightly and is now less than 1.5 therefore he will qualify for the 28m twice daily dose.  He does note some improvement in shortness of breath which may have been due to some changes in his chemotherapy.  EKG today shows an atrial flutter at 73.  PMHx:  Past Medical History:  Diagnosis Date  . Aortic stenosis, mild 11/14/2013  . Aortic valve sclerosis 03/29/2015  . Bilateral leg edema 05/21/2014  . CAD (coronary artery disease)   . Chronic diastolic congestive  heart failure ([place]   . Chronic kidney disease (CKD), stage III (moderate) (HCC)   . Diabetes 1.5, managed as type 1 ([place] 02/04/2013  . Diabetic peripheral neuropathy associated with type 1 diabetes mellitus ([place] 11/14/2013  . Dysphagia   . Dyspnea on exertion 03/21/2015  . Exogenous obesity   . Gout   .  Heart attack ([place])   . History of nuclear stress test 07/2011   dipyridamole; fixed inferolateral defect, worse at stress than rest; no reversible ischemia; low risk scan   . Hyperlipidemia   . Hypertension   . Hypothyroidism   . Insulin dependent diabetes mellitus   . Left foot drop   . Left main coronary artery disease 03/28/2015  . lung ca dx'd 08/2020  . Memory loss   . Obesity (BMI 30-39.9) 11/14/2013  . Obstructive sleep apnea 03/21/2015  . OSA on CPAP    uses a cpap  . Peptic ulcer with hemorrhage 03/28/2015  . Peripheral neuropathy   . Pneumonia   . Rhabdomyolysis   . 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 Stroke ([place])    L patietal with small scattered lacunar infarcts  . Thrombocytopenia ([place]) 03/21/2015  . Venous insufficiency   . Weakness generalized 03/21/2015    Past Surgical History:  Procedure Laterality Date  . BACK SURGERY  2002   lumbosacral. 11 back surgeries total  . BRONCHIAL BIOPSY  08/06/2020   Procedure: BRONCHIAL BIOPSIES;  Surgeon: Collene Gobble, MD;  Location: [facility] ENDOSCOPY;  Service: Pulmonary;;  . BRONCHIAL BRUSHINGS  08/06/2020   Procedure: BRONCHIAL BRUSHINGS;  Surgeon: Collene Gobble, MD;  Location: [facility] ENDOSCOPY;  Service: Pulmonary;;  . BRONCHIAL NEEDLE ASPIRATION BIOPSY  08/06/2020   Procedure: BRONCHIAL NEEDLE ASPIRATION BIOPSIES;  Surgeon: Collene Gobble, MD;  Location: [facility] ENDOSCOPY;  Service: Pulmonary;;  . Carotid Doppler  03/2013   bilat bulb/prox ICAs - mild amount of fibrous plaque with no evidence of diameter reduction  . CARPAL TUNNEL RELEASE Bilateral 08/09/2014   Procedure: BILATERAL CARPAL TUNNEL  RELEASE;  Surgeon: Daryll Brod, MD;  Location: [place];  Service: Orthopedics;  Laterality: Bilateral;  ANESTHESIA:  IV REGIONAL BIL FAB  . CHOLECYSTECTOMY    . COLONOSCOPY    . CORONARY ANGIOPLASTY  10/13/1996  . CORONARY ANGIOPLASTY  09/21/1989   emergency PTCA  . CORONARY ANGIOPLASTY  10/13/1996   Multi-Link diagonal & OD stenting (Dr. Marella Chimes)  . CORONARY ANGIOPLASTY  12/03/1997   disease of mid DX-1 ~50% & in mid PLA & PDA (distal lesions) (Dr. Marella Chimes)   . CORONARY ANGIOPLASTY  10/14/1999   progression of disease distal PLA & PDA; progression of disease prox RCA - moderate (Dr. Marella Chimes)   . CORONARY ANGIOPLASTY WITH STENT PLACEMENT  04/04/2004   4.0x33m non-DES (thrombectomy via AngioJet) to RCA for high grade stenosis (Dr. RMarella Chimes  . CORONARY ARTERY BYPASS GRAFT N/A 03/29/2015   Procedure: CORONARY ARTERY BYPASS GRAFTING TIMES TWO USING LEFT INTERNAL MAMMARY ARTERY AND RIGHT LEG GREATER SAPHENOUS VEIN HARVESTED ENDOSCOPICALLY.;  Surgeon: CRexene Alberts MD;  [place]  Service: Open Heart Surgery;  Laterality: N/A;  . ESOPHAGOGASTRODUODENOSCOPY N/A 03/27/2015   Procedure: ESOPHAGOGASTRODUODENOSCOPY (EGD);  Surgeon: MClarene Essex MD;  [facility];  Service: Endoscopy;  Laterality: N/A;  possible dilation  . ESOPHAGOGASTRODUODENOSCOPY N/A 10/05/2020   Procedure: ESOPHAGOGASTRODUODENOSCOPY (EGD);  Surgeon: OArta Silence MD;  [facility];  Service: Endoscopy;  Laterality: N/A;  . FINE NEEDLE ASPIRATION  08/06/2020   Procedure: FINE NEEDLE ASPIRATION (FNA) LINEAR;  Surgeon: BCollene Gobble MD;  [facility];  Service: Pulmonary;;  . IR IMAGING GUIDED PORT INSERTION  10/07/2020  . LEFT HEART CATHETERIZATION WITH CORONARY ANGIOGRAM N/A 03/28/2015   Procedure: LEFT HEART CATHETERIZATION WITH CORONARY  Cyril Loosen;  Surgeon: Peter M Martinique, MD;  Location[facility] C[facility] LAB;  Service: Cardiovascular;  Laterality: N/A;  . SINUS  ENDO W/FUSION    . TONSILLECTOMY    . TRANSTHORACIC ECHOCARDIOGRAM  08/08/2013   EF 55-60%, mild conc hypertrophy, grade 1 diastolic dysfunction; AV with mild stenosis; LA & RA mildly dilated  . VIDEO BRONCHOSCOPY WITH ENDOBRONCHIAL NAVIGATION N/A 08/06/2020   Procedure: VIDEO BRONCHOSCOPY WITH ENDOBRONCHIAL NAVIGATION;  Surgeon: Collene Gobble, MD;  Location: [facility] ENDOSCOPY;  Service: Pulmonary;  Laterality: N/A;  . VIDEO BRONCHOSCOPY WITH ENDOBRONCHIAL ULTRASOUND N/A 08/06/2020   Procedure: VIDEO BRONCHOSCOPY WITH ENDOBRONCHIAL ULTRASOUND;  Surgeon: Collene Gobble, MD;  Location: [facility] ENDOSCOPY;  Service: Pulmonary;  Laterality: N/A;    FAMHx:  Family History  Problem Relation Age of Onset  . Heart disease Mother   . Coronary artery disease Father   . Cancer Maternal Grandmother   . Heart Problems Maternal Grandfather   . Diabetes Son        borderline     SOCHx:   reports that he quit smoking about 47 years ago. His smoking use included cigarettes. He has a 40.00 pack-year smoking history. He has never used smokeless tobacco. He reports that he does not drink alcohol and does not use drugs.  ALLERGIES:  Allergies  Allergen Reactions  . Actos [Pioglitazone] Swelling  . Metformin And Related Nausea Only    3.16.2022 pt is currently taking this medication at home  . Niaspan [Niacin Er] Itching and Rash  . Wound Dressing Adhesive Rash    ROS: Pertinent items noted in HPI and remainder of comprehensive ROS otherwise negative.  HOME MEDS: Current Outpatient Medications  Medication Sig Dispense Refill  . albuterol (VENTOLIN HFA) 108 (90 Base) MCG/ACT inhaler Inhale 2 puffs into the lungs every 6 (six) hours as needed for wheezing or shortness of breath. 8 g 6  . allopurinol (ZYLOPRIM) 100 MG tablet Take 1 tablet (100 mg total) by mouth daily. 30 tablet 6  . amitriptyline (ELAVIL) 75 MG tablet Take 75 mg by mouth at bedtime.    Marland Kitchen amLODipine (NORVASC) 10 MG tablet Take 1 tablet (10 mg  total) by mouth daily. 30 tablet 0  . apixaban (ELIQUIS) 2.5 MG TABS tablet Take 1 tablet (2.5 mg total) by mouth 2 (two) times daily. 180 tablet 3  . Blood Glucose Monitoring Suppl (CONTOUR NEXT MONITOR) w/Device KIT 1 Device by Does not apply route 3 (three) times daily. Use to check blood sugars 3 times daily. Dx Code E13.9 1 kit 2  . CONTOUR NEXT TEST test strip USE 1 STRIP TO CHECK GLUCOSE 4 TIMES DAILY 300 each 1  . furosemide (LASIX) 20 MG tablet Take 60 mg in the morning and 40 mg 120 tablet 0  . insulin degludec (TRESIBA FLEXTOUCH) 200 UNIT/ML FlexTouch Pen Inject 24 Units into the skin at bedtime. (Patient taking differently: Inject 16 Units into the skin at bedtime.)    . insulin lispro (HUMALOG) 100 UNIT/ML cartridge Inject 5-15 Units into the skin 3 (three) times daily with meals. Sliding scale.    . Insulin Pen Needle (EASY COMFORT PEN NEEDLES) 33G X 4 MM MISC 1 each by Does not apply route See admin instructions. Use to inject insulin 5 times daily. 150 each 3  . levothyroxine (SYNTHROID, LEVOTHROID) 75 MCG tablet Take 75 mcg by mouth daily before breakfast.   1  . lidocaine (XYLOCAINE) 2 % solution Use as directed 15 mLs in the mouth or  throat as needed for mouth pain. 100 mL 0  . lidocaine-prilocaine (EMLA) cream Apply 1 application topically as needed. Apply a teaspoon over port site at least 1 hour prior to lab appt. Do not rub in and cover with plastic wrap. 30 g 0  . lisinopril (ZESTRIL) 10 MG tablet TAKE ONE TABLET DAILY (Patient taking differently: Take 10 mg by mouth daily.) 90 tablet 2  . metFORMIN (GLUCOPHAGE-XR) 750 MG 24 hr tablet TAKE 1 TABLET WITH DINNER 90 tablet 0  . ondansetron (ZOFRAN) 4 MG tablet Take 1 tablet (4 mg total) by mouth every 6 (six) hours as needed for nausea. 20 tablet 0  . OXYGEN Inhale 2 L into the lungs. At bedtime and during the day prn    . potassium chloride SA (K-DUR,KLOR-CON) 20 MEQ tablet Take 20 mEq by mouth 2 (two) times daily.     .  prednisoLONE acetate (PRED FORTE) 1 % ophthalmic suspension Place 1 drop into the right eye 4 (four) times daily. 10 mL 0  . prochlorperazine (COMPAZINE) 10 MG tablet Take 1 tablet (10 mg total) by mouth every 6 (six) hours as needed for nausea or vomiting. 30 tablet 0  . rosuvastatin (CRESTOR) 20 MG tablet Take 20 mg by mouth daily.    . sotorasib (LUMAKRAS) 120 MG TABS Take 960 mg by mouth every morning. 240 tablet 3  . spironolactone (ALDACTONE) 25 MG tablet Take 0.5 tablets (12.5 mg total) by mouth daily. 15 tablet 0  . Tamsulosin HCl (FLOMAX) 0.4 MG CAPS Take 0.4 mg by mouth at bedtime.    . Tiotropium Bromide Monohydrate (SPIRIVA RESPIMAT) 2.5 MCG/ACT AERS Inhale 2 puffs into the lungs daily. 4 g 3  . vitamin B-12 (CYANOCOBALAMIN) 1000 MCG tablet Take 1,000 mcg by mouth daily.     No current facility-administered medications for this visit.    LABS/IMAGING: No results found for this or any previous visit (from the past 48 hour(s)). No results found.  VITALS: BP (!) 149/84   Pulse 73   Ht '5\' 7"'  (1.702 m)   Wt 229 lb (103.9 kg)   SpO2 (!) 82%   BMI 35.87 kg/m   EXAM: General appearance: alert, no distress and in wheelchair Neck: JVD - 3 cm above sternal notch, no carotid bruit and thyroid not enlarged, symmetric, no tenderness/mass/nodules Lungs: diminished breath sounds bibasilar and rales bibasilar Heart: irregularly irregular rhythm Abdomen: soft, non-tender; bowel sounds normal; no masses,  no organomegaly Extremities: edema 1+ RLE and LLE in a brace Pulses: 2+ and symmetric Skin: Skin color, texture, turgor normal. No rashes or lesions Neurologic: Grossly normal Psych: Pleasant  EKG: Atrial flutter at 73, pulmonary disease pattern and left anterior fascicular block-personally reviewed  ASSESSMENT: 1. Paroxysmal atrial fibrillation/flutter - CHADSVASC score of 6 2. Lung Cancer 3. Recent multifocal pneumonia 4. Coronary artery disease status post BMS to the RCA in  2005, recent CABG 2 (LIMA to LAD - for critical left main disease, SVG to OM1)- 2016 5. Obesity 6. Hypertension-controlled 7. Dyslipidemia 8. Prior stroke 9. Diabetic neuropathy 10. Diabetic nephropathy - left foot drop 11. Type 1 diabetes on insulin 12. LE edema 13. Mild aortic stenosis 14. LVEF 48% with mild to moderate AS (03/2020)  PLAN: 1.   [name] says he actually has some improvement in his shortness of breath today.  He is in atrial flutter.  He is anticoagulated.  He can hold his Eliquis 2 days prior to his procedure and restart afterwards at the  5 mg twice daily dose because his creatinine has improved slightly.  He already has a scheduled follow-up in June.  Follow-up with me in 3 months.  Pixie Casino, MD, [facility], Tower Director of the Advanced Lipid Disorders &  Cardiovascular Risk Reduction Clinic Diplomate of the American Board of Clinical Lipidology Attending Cardiologist  Direct Dial: [phone]  Fax: [phone]  Website:  [email or web address]  Nadean Corwin Patches Mcdonnell 03/11/2021, 2:25 PM

## 2021-03-11 NOTE — Patient Instructions (Signed)
Medication Instructions:  OK to hold eliquis TWO DAYS prior to dental work  INCREASE eliquis to 5mg  twice daily  *If you need a refill on your cardiac medications before your next appointment, please call your pharmacy*  Follow-Up: At [facility], you and your health needs are our priority.  As part of our continuing mission to provide you with exceptional heart care, we have created designated Provider Care Teams.  These Care Teams include your primary Cardiologist (physician) and Advanced Practice Providers (APPs -  Physician Assistants and Nurse Practitioners) who all work together to provide you with the care you need, when you need it.  We recommend signing up for the patient portal called "MyChart".  Sign up information is provided on this After Visit Summary.  MyChart is used to connect with patients for Virtual Visits (Telemedicine).  Patients are able to view lab/test results, encounter notes, upcoming appointments, etc.  Non-urgent messages can be sent to your provider as well.   To learn more about what you can do with MyChart, go to [email or web address].    Your next appointment:   June 16 as scheduled

## 2021-03-11 NOTE — Telephone Encounter (Signed)
Patient with diagnosis of A Fib on Eliquis for anticoagulation.    Procedure: Localized flap procedure with a subgingival resin restoration   ( this will require reflection of the gum tissue and 2 chrome gut sutures)  Date of procedure: 03/11/21   CHA2DS2-VASc Score = 8  This indicates a 10.8% annual risk of stroke. The patient's score is based upon: CHF History: Yes HTN History: Yes Diabetes History: Yes Stroke History: Yes Vascular Disease History: Yes Age Score: 2 Gender Score: 0  CrCl 59 mL/min Platelet count 127K  Patient does not require pre-op antibiotics for dental procedure.  Patient high risk off anticoagulation. However if procedure is scheduled for today then he would not be able to hold Eliquis regardless.  Will route to Dr. Debara Pickett.  Of note, patient currently on Eliquis 2.5mg  BID.  However, last Scr on 03/03/21 was 1.40 so patient is eligible for dose increase to 5mg  BID.

## 2021-03-11 NOTE — Telephone Encounter (Signed)
   [facility] Pre-operative Risk Assessment     Request for surgical clearance:  1. What type of surgery is being performed? Localized flap procedure with a subgingival resin restoration   ( this will require reflection of the gum tissue and 2 chrome gut sutures)  2. When is this surgery scheduled?  03/11/21  3. What type of clearance is required (medical clearance vs. Pharmacy clearance to hold med vs. Both)? Both   4. Are there any medications that need to be held prior to surgery and how long?  Eliquis  5. Practice name and name of physician performing surgery?[facility] ; Dr Michel Santee DMD  6. What is the office phone number?  [phone]   2.   What is the office fax number? [phone]  8.   Anesthesia type (None, local, MAC, general) ? Articaine HCL 4% with 1:100,000 epinephrine,   Raiford Simmonds 03/11/2021, 8:55 AM  _________________________________________________________________   (provider comments below)

## 2021-03-14 ENCOUNTER — Encounter (HOSPITAL_COMMUNITY): Payer: Self-pay

## 2021-03-14 ENCOUNTER — Emergency Department (HOSPITAL_COMMUNITY)
Admission: EM | Admit: 2021-03-14 | Discharge: 2021-03-14 | Disposition: A | Discharge status: Home / Self Care | Payer: Medicare Other | Attending: Emergency Medicine | Admitting: Emergency Medicine

## 2021-03-14 ENCOUNTER — Telehealth: Payer: Self-pay | Admitting: Cardiology

## 2021-03-14 ENCOUNTER — Other Ambulatory Visit: Payer: Self-pay

## 2021-03-14 ENCOUNTER — Emergency Department (HOSPITAL_COMMUNITY): Payer: Medicare Other

## 2021-03-14 DIAGNOSIS — I130 Hypertensive heart and chronic kidney disease with heart failure and stage 1 through stage 4 chronic kidney disease, or unspecified chronic kidney disease: Secondary | ICD-10-CM | POA: Diagnosis not present

## 2021-03-14 DIAGNOSIS — R002 Palpitations: Secondary | ICD-10-CM | POA: Insufficient documentation

## 2021-03-14 DIAGNOSIS — Z79899 Other long term (current) drug therapy: Secondary | ICD-10-CM | POA: Diagnosis not present

## 2021-03-14 DIAGNOSIS — J9811 Atelectasis: Secondary | ICD-10-CM | POA: Diagnosis not present

## 2021-03-14 DIAGNOSIS — E1022 Type 1 diabetes mellitus with diabetic chronic kidney disease: Secondary | ICD-10-CM | POA: Insufficient documentation

## 2021-03-14 DIAGNOSIS — C3490 Malignant neoplasm of unspecified part of unspecified bronchus or lung: Secondary | ICD-10-CM | POA: Diagnosis not present

## 2021-03-14 DIAGNOSIS — R0602 Shortness of breath: Secondary | ICD-10-CM | POA: Insufficient documentation

## 2021-03-14 DIAGNOSIS — E1040 Type 1 diabetes mellitus with diabetic neuropathy, unspecified: Secondary | ICD-10-CM | POA: Diagnosis not present

## 2021-03-14 DIAGNOSIS — Z951 Presence of aortocoronary bypass graft: Secondary | ICD-10-CM | POA: Diagnosis not present

## 2021-03-14 DIAGNOSIS — N183 Chronic kidney disease, stage 3 unspecified: Secondary | ICD-10-CM | POA: Diagnosis not present

## 2021-03-14 DIAGNOSIS — E039 Hypothyroidism, unspecified: Secondary | ICD-10-CM | POA: Insufficient documentation

## 2021-03-14 DIAGNOSIS — I2510 Atherosclerotic heart disease of native coronary artery without angina pectoris: Secondary | ICD-10-CM | POA: Insufficient documentation

## 2021-03-14 DIAGNOSIS — Z7984 Long term (current) use of oral hypoglycemic drugs: Secondary | ICD-10-CM | POA: Insufficient documentation

## 2021-03-14 DIAGNOSIS — I5032 Chronic diastolic (congestive) heart failure: Secondary | ICD-10-CM | POA: Diagnosis not present

## 2021-03-14 DIAGNOSIS — Z85118 Personal history of other malignant neoplasm of bronchus and lung: Secondary | ICD-10-CM | POA: Diagnosis not present

## 2021-03-14 DIAGNOSIS — R0600 Dyspnea, unspecified: Secondary | ICD-10-CM | POA: Diagnosis not present

## 2021-03-14 DIAGNOSIS — Z87891 Personal history of nicotine dependence: Secondary | ICD-10-CM | POA: Insufficient documentation

## 2021-03-14 DIAGNOSIS — Z794 Long term (current) use of insulin: Secondary | ICD-10-CM | POA: Insufficient documentation

## 2021-03-14 DIAGNOSIS — Z7901 Long term (current) use of anticoagulants: Secondary | ICD-10-CM | POA: Diagnosis not present

## 2021-03-14 DIAGNOSIS — R5383 Other fatigue: Secondary | ICD-10-CM | POA: Insufficient documentation

## 2021-03-14 DIAGNOSIS — I517 Cardiomegaly: Secondary | ICD-10-CM | POA: Diagnosis not present

## 2021-03-14 LAB — BASIC METABOLIC PANEL
Anion gap: 8 (ref 5–15)
BUN: 16 mg/dL (ref 8–23)
CO2: 28 mmol/L (ref 22–32)
Calcium: 9.2 mg/dL (ref 8.9–10.3)
Chloride: 104 mmol/L (ref 98–111)
Creatinine, Ser: 1.42 mg/dL — ABNORMAL HIGH (ref 0.61–1.24)
GFR, Estimated: 50 mL/min — ABNORMAL LOW (ref >60)
Glucose, Bld: 191 mg/dL — ABNORMAL HIGH (ref 70–99)
Potassium: 3.9 mmol/L (ref 3.5–5.1)
Sodium: 140 mmol/L (ref 135–145)

## 2021-03-14 LAB — CBC
HCT: 37.2 % — ABNORMAL LOW (ref 39.0–52.0)
Hemoglobin: 11 g/dL — ABNORMAL LOW (ref 13.0–17.0)
MCH: 28.1 pg (ref 26.0–34.0)
MCHC: 29.6 g/dL — ABNORMAL LOW (ref 30.0–36.0)
MCV: 95.1 fL (ref 80.0–100.0)
Platelets: 128 10*3/uL — ABNORMAL LOW (ref 150–400)
RBC: 3.91 MIL/uL — ABNORMAL LOW (ref 4.22–5.81)
RDW: 15.9 % — ABNORMAL HIGH (ref 11.5–15.5)
WBC: 3.9 10*3/uL — ABNORMAL LOW (ref 4.0–10.5)
nRBC: 0 % (ref 0.0–0.2)

## 2021-03-14 LAB — TROPONIN I (HIGH SENSITIVITY): Troponin I (High Sensitivity): 53 ng/L — ABNORMAL HIGH (ref <18)

## 2021-03-14 LAB — BRAIN NATRIURETIC PEPTIDE: B Natriuretic Peptide: 356 pg/mL — ABNORMAL HIGH (ref 0.0–100.0)

## 2021-03-14 LAB — MAGNESIUM: Magnesium: 1.9 mg/dL (ref 1.7–2.4)

## 2021-03-14 NOTE — ED Notes (Signed)
PT transported to XRAY 

## 2021-03-14 NOTE — ED Triage Notes (Signed)
Pt woke up feeling fatigued and slightly SOB. Wife took vitals and noticed bradycardia. Called PCP and they advise to come to ER for evaluation.

## 2021-03-14 NOTE — Telephone Encounter (Signed)
STAT if HR is under 50 or over 120 (normal HR is 60-100 beats per minute)  1) What is your heart rate? 36  2) Do you have a log of your heart rate readings (document readings)? Yes   3) Do you have any other symptoms?  Sleepy all the time; cancer patient; fatigue

## 2021-03-14 NOTE — ED Notes (Signed)
PT stated he is on 2L of O2 at home. PT is on 2L of O2, saturation at 100%.

## 2021-03-14 NOTE — ED Notes (Signed)
PT's O2 saturation was 74% after arriving from triage. PT stated he wears oxygen at home, PT stated he wears 20 at home, PT stated his wife knows his medications he does not remember how much oxygen he wears at home. Wife is not here at this time to confirm. This tech placed pt on 4L of O2. Patients oxygen saturation was 97% on 4L. RN made aware.

## 2021-03-14 NOTE — Discharge Instructions (Addendum)
Your blood tests are reassuring today.  Specifically we did not see signs of infection, dehydration, worsening heart failure.  We talked about the fact that you are getting false readings on your finger pulse oximeter, the machine that checks your oxygen and heart rate on your finger.  This may be giving you heart rate readings of 30-40 beats per minute.  Randall Wilkins's actual heart rate, when you listen to his chest and check his EKG, is about 65-70 beats per minute.  I believe that the blood pressure machine you have at home may give you more accurate heart rate.  Please bring this up the next time you see his heart doctor, and also make any home health aide, physical therapist, or other aide aware of this fact.

## 2021-03-14 NOTE — ED Provider Notes (Signed)
[facility] EMERGENCY DEPARTMENT Provider Note   CSN: [id number] Arrival date & time: 03/14/21  1020     History Chief Complaint  Patient presents with  . Bradycardia  . Irregular Heart Beat    Randall Wilkins is a 80 y.o. male w/ hx of HTN, Stave IV non-small cell lung cancer on oral chemotherapy, CHF (EF 40-45% on 02/27/21 echo), diabetes, CVA, CKD, CAD s/p CABG x 2, A Fib/flutter on Eliquis, presenting to ED with fatigue and shortness of breath.  Patient's wife noted his HR was 34 on his finger pulse ox when she checked this morning and that he was very fatigued, wanting only to sleep.  His wife reports to me that his HR was 60 bpm when she checked it with the BP cuff.  She also notes that he's had some fatigue since being on chemotherapy, and "he's been slower to get up in the mornings."   When I asked her if there was anything particularly unusual about his fatigue this morning, she states there was not, but the low heart rate prompted her to call the cardiology office  She called the cardiology office and was advised to bring him to the ED for evaluation.  The patient has some early memory difficulties, on Aricept.  He reports that he felt groggy this morning and wanted to go back to bed, but this is not unusual for him.  He states, "I'm always slow in the morning."  He feels normal now.  He was just discharged from the hospital on 03/03/21 for bradycardia and CHF.  He had an echo as noted below.  Seen by cardiology who recommended no pacemaker, continue home meds.  Seen by cardiologist Dr Debara Pickett on 03/11/21 (3 days ago) in the office.    Last echo was two weeks ago, on 02/27/21:  IMPRESSIONS   1. Left ventricular ejection fraction, by estimation, is 40 to 45%. The  left ventricle has mildly decreased function. The left ventricle  demonstrates global hypokinesis. There is mild asymmetric left ventricular  hypertrophy. Left ventricular diastolic  parameters are  indeterminate.  2. Right ventricular systolic function was not well visualized. The right  ventricular size is not well visualized. There is severely elevated  pulmonary artery systolic pressure. The estimated right ventricular  systolic pressure is 81.0 mmHg.  3. Right atrial size was mildly dilated.  4. The mitral valve is grossly normal. Trivial mitral valve  regurgitation.  5. The aortic valve is grossly normal. There is mild calcification of the  aortic valve. There is mild thickening of the aortic valve. Aortic valve  regurgitation is trivial. Mild aortic valve stenosis.    HPI     Past Medical History:  Diagnosis Date  . Aortic stenosis, mild 11/14/2013  . Aortic valve sclerosis 03/29/2015  . Bilateral leg edema 05/21/2014  . CAD (coronary artery disease)   . Chronic diastolic congestive heart failure ([place])   . Chronic kidney disease (CKD), stage III (moderate) (HCC)   . Diabetes 1.5, managed as type 1 ([place]) 02/04/2013  . Diabetic peripheral neuropathy associated with type 1 diabetes mellitus ([place]) 11/14/2013  . Dysphagia   . Dyspnea on exertion 03/21/2015  . Exogenous obesity   . Gout   . Heart attack ([place])   . History of nuclear stress test 07/2011   dipyridamole; fixed inferolateral defect, worse at stress than rest; no reversible ischemia; low risk scan   . Hyperlipidemia   . Hypertension   . Hypothyroidism   .  Insulin dependent diabetes mellitus   . Left foot drop   . Left main coronary artery disease 03/28/2015  . lung ca dx'd 08/2020  . Memory loss   . Obesity (BMI 30-39.9) 11/14/2013  . Obstructive sleep apnea 03/21/2015  . OSA on CPAP    uses a cpap  . Peptic ulcer with hemorrhage 03/28/2015  . Peripheral neuropathy   . Pneumonia   . Rhabdomyolysis   . 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 Stroke ([place])    L patietal with small scattered lacunar infarcts  . Thrombocytopenia ([place]) 03/21/2015  . Venous insufficiency   .  Weakness generalized 03/21/2015    Patient Active Problem List   Diagnosis Date Noted  . Symptomatic bradycardia 02/26/2021  . Port-A-Cath in place 02/03/2021  . Macular pucker, right eye 01/29/2021  . Macular pucker, left eye 01/29/2021  . Memory loss 12/23/2020  . Cerebrovascular accident (CVA) ([place]) 12/23/2020  . ARF (acute renal failure) ([place]) 10/02/2020  . Adenocarcinoma of right lung, stage 4 ([place]) 09/02/2020  . Encounter for antineoplastic immunotherapy 09/02/2020  . Goals of care, counseling/discussion 08/20/2020  . Encounter for antineoplastic chemotherapy 08/20/2020  . Mediastinal lymphadenopathy 08/06/2020  . Lung mass 06/05/2020  . Essential hypertension 03/23/2018  . Mild cognitive impairment 12/24/2017  . Gait abnormality 12/24/2017  . Murmur, cardiac 03/04/2017  . History of stroke 12/12/2015  . Gait disorder 12/12/2015  . Coronary artery disease involving native coronary artery of native heart without angina pectoris   . S/P CABG x 2 03/29/2015  . Aortic valve sclerosis 03/29/2015  . Peptic ulcer with hemorrhage 03/28/2015  . Left main coronary artery disease 03/28/2015  . Chronic diastolic congestive heart failure ([place])   . Chronic kidney disease (CKD), stage III (moderate) (HCC)   . Dysphagia   . AKI (acute kidney injury) ([place])   . Acute respiratory failure with hypoxia ([place]) 03/25/2015  . NSTEMI (non-ST elevated myocardial infarction) ([place]) 03/25/2015  . Shortness of breath   . UTI (lower urinary tract infection)   . Subendocardial MI subsequent episode care ([place]) 03/24/2015  . SOB (shortness of breath)   . Confusion 03/21/2015  . Weakness generalized 03/21/2015  . Increased urinary frequency 03/21/2015  . Hyperglycemia 03/21/2015  . Thrombocytopenia ([place]) 03/21/2015  . UTI (urinary tract infection) 03/21/2015  . Fall 03/21/2015  . Hematoma of abdominal wall 03/21/2015  . Diarrhea 03/21/2015  . Obstructive sleep apnea 03/21/2015  . Acute renal failure  ([place]) 03/21/2015  . Mild diastolic dysfunction 49/44/9675  . Dyspnea on exertion 03/21/2015  . Neuropathy 09/11/2014  . Bilateral leg edema 05/21/2014  . Diabetic peripheral neuropathy associated with type 1 diabetes mellitus ([place]) 11/14/2013  . Type 1 diabetes mellitus with nephropathy ([place]) 11/14/2013  . Aortic valve stenosis 11/14/2013  . Obesity (BMI 30-39.9) 11/14/2013  . Asymptomatic PVCs 11/14/2013  . Memory impairment 02/04/2013  . Hyperlipidemia 02/04/2013  . Diabetes 1.5, managed as type 1 ([place]) 02/04/2013  . Cerebral infarction ([place]) 02/04/2013  . CAD S/P percutaneous coronary angioplasty 11/14/2004    Past Surgical History:  Procedure Laterality Date  . BACK SURGERY  2002   lumbosacral. 11 back surgeries total  . BRONCHIAL BIOPSY  08/06/2020   Procedure: BRONCHIAL BIOPSIES;  Surgeon: Collene Gobble, MD;  Location: [facility] ENDOSCOPY;  Service: Pulmonary;;  . BRONCHIAL BRUSHINGS  08/06/2020   Procedure: BRONCHIAL BRUSHINGS;  Surgeon: Collene Gobble, MD;  Location: [facility] ENDOSCOPY;  Service: Pulmonary;;  . BRONCHIAL NEEDLE ASPIRATION BIOPSY  08/06/2020   Procedure: BRONCHIAL NEEDLE ASPIRATION BIOPSIES;  Surgeon: Collene Gobble, MD;  Location: [facility] ENDOSCOPY;  Service: Pulmonary;;  . Carotid Doppler  03/2013   bilat bulb/prox ICAs - mild amount of fibrous plaque with no evidence of diameter reduction  . CARPAL TUNNEL RELEASE Bilateral 08/09/2014   Procedure: BILATERAL CARPAL TUNNEL RELEASE;  Surgeon: Daryll Brod, MD;  Location: [facility];  Service: Orthopedics;  Laterality: Bilateral;  ANESTHESIA:  IV REGIONAL BIL FAB  . CHOLECYSTECTOMY    . COLONOSCOPY    . CORONARY ANGIOPLASTY  10/13/1996  . CORONARY ANGIOPLASTY  09/21/1989   emergency PTCA  . CORONARY ANGIOPLASTY  10/13/1996   Multi-Link diagonal & OD stenting (Dr. Marella Chimes)  . CORONARY ANGIOPLASTY  12/03/1997   disease of mid DX-1 ~50% & in mid PLA & PDA (distal lesions) (Dr. Marella Chimes)   . CORONARY  ANGIOPLASTY  10/14/1999   progression of disease distal PLA & PDA; progression of disease prox RCA - moderate (Dr. Marella Chimes)   . CORONARY ANGIOPLASTY WITH STENT PLACEMENT  04/04/2004   4.0x60m non-DES (thrombectomy via AngioJet) to RCA for high grade stenosis (Dr. RMarella Chimes  . CORONARY ARTERY BYPASS GRAFT N/A 03/29/2015   Procedure: CORONARY ARTERY BYPASS GRAFTING TIMES TWO USING LEFT INTERNAL MAMMARY ARTERY AND RIGHT LEG GREATER SAPHENOUS VEIN HARVESTED ENDOSCOPICALLY.;  Surgeon: CRexene Alberts MD;  [place] [facility]  Service: Open Heart Surgery;  Laterality: N/A;  . ESOPHAGOGASTRODUODENOSCOPY N/A 03/27/2015   Procedure: ESOPHAGOGASTRODUODENOSCOPY (EGD);  Surgeon: MClarene Essex MD;  [facility];  Service: Endoscopy;  Laterality: N/A;  possible dilation  . ESOPHAGOGASTRODUODENOSCOPY N/A 10/05/2020   Procedure: ESOPHAGOGASTRODUODENOSCOPY (EGD);  Surgeon: OArta Silence MD;  [facility];  Service: Endoscopy;  Laterality: N/A;  . FINE NEEDLE ASPIRATION  08/06/2020   Procedure: FINE NEEDLE ASPIRATION (FNA) LINEAR;  Surgeon: BCollene Gobble MD;  [facility];  Service: Pulmonary;;  . IR IMAGING GUIDED PORT INSERTION  10/07/2020  . LEFT HEART CATHETERIZATION WITH CORONARY ANGIOGRAM N/A 03/28/2015   Procedure: LEFT HEART CATHETERIZATION WITH CORONARY ANGIOGRAM;  Surgeon: Peter M JMartinique MD;  [facility];  Service: Cardiovascular;  Laterality: N/A;  . SINUS ENDO W/FUSION    . TONSILLECTOMY    . TRANSTHORACIC ECHOCARDIOGRAM  08/08/2013   EF 55-60%, mild conc hypertrophy, grade 1 diastolic dysfunction; AV with mild stenosis; LA & RA mildly dilated  . VIDEO BRONCHOSCOPY WITH ENDOBRONCHIAL NAVIGATION N/A 08/06/2020   Procedure: VIDEO BRONCHOSCOPY WITH ENDOBRONCHIAL NAVIGATION;  Surgeon: BCollene Gobble MD;  [facility];  Service: Pulmonary;  Laterality: N/A;  . VIDEO BRONCHOSCOPY WITH ENDOBRONCHIAL ULTRASOUND N/A 08/06/2020   Procedure: VIDEO  BRONCHOSCOPY WITH ENDOBRONCHIAL ULTRASOUND;  Surgeon: BCollene Gobble MD;  [facility];  Service: Pulmonary;  Laterality: N/A;       Family History  Problem Relation Age of Onset  . Heart disease Mother   . Coronary artery disease Father   . Cancer Maternal Grandmother   . Heart Problems Maternal Grandfather   . Diabetes Son        borderline     Social History   Tobacco Use  . Smoking status: Former Smoker    Packs/day: 2.00    Years: 20.00    Pack years: 40.00    Types: Cigarettes    Quit date: 01/25/1974    Years since quitting: 47.1  . Smokeless tobacco: Never Used  Vaping Use  .  Vaping Use: Never used  Substance Use Topics  . Alcohol use: No    Alcohol/week: 0.0 standard drinks  . Drug use: No    Home Medications Prior to Admission medications   Medication Sig Start Date End Date Taking? Authorizing Provider  albuterol (VENTOLIN HFA) 108 (90 Base) MCG/ACT inhaler Inhale 2 puffs into the lungs every 6 (six) hours as needed for wheezing or shortness of breath. 09/04/20  Yes Collene Gobble, MD  allopurinol (ZYLOPRIM) 100 MG tablet Take 1 tablet (100 mg total) by mouth daily. 10/18/20  Yes Hilty, Nadean Corwin, MD  amitriptyline (ELAVIL) 75 MG tablet Take 75 mg by mouth at bedtime. 12/10/20  Yes [provider]  amLODipine (NORVASC) 10 MG tablet Take 1 tablet (10 mg total) by mouth daily. 03/03/21  Yes Thurnell Lose, MD  apixaban (ELIQUIS) 2.5 MG TABS tablet Take 2.5 mg by mouth 2 (two) times daily.   Yes [provider]  Blood Glucose Monitoring Suppl (CONTOUR NEXT MONITOR) w/Device KIT 1 Device by Does not apply route 3 (three) times daily. Use to check blood sugars 3 times daily. Dx Code E13.9 07/29/17  Yes Elayne Snare, MD  CONTOUR NEXT TEST test strip USE 1 STRIP TO CHECK GLUCOSE 4 TIMES DAILY 10/13/20  Yes Elayne Snare, MD  furosemide (LASIX) 20 MG tablet Take 60 mg in the morning and 40 mg Patient taking differently: Take 40-60 mg by mouth  See admin instructions. Take 60 mg in the morning and 40 mg in the evening. 03/03/21  Yes Thurnell Lose, MD  insulin degludec (TRESIBA FLEXTOUCH) 200 UNIT/ML FlexTouch Pen Inject 24 Units into the skin at bedtime. Patient taking differently: Inject 16 Units into the skin at bedtime. 10/08/20  Yes Sheikh, Omair Latif, DO  insulin lispro (HUMALOG) 100 UNIT/ML cartridge Inject 5-15 Units into the skin 3 (three) times daily with meals. Sliding scale.   Yes [provider]  Insulin Pen Needle (EASY COMFORT PEN NEEDLES) 33G X 4 MM MISC 1 each by Does not apply route See admin instructions. Use to inject insulin 5 times daily. 05/31/19  Yes Elayne Snare, MD  levothyroxine (SYNTHROID, LEVOTHROID) 75 MCG tablet Take 75 mcg by mouth daily before breakfast.  05/14/17  Yes [provider]  lidocaine (XYLOCAINE) 2 % solution Use as directed 15 mLs in the mouth or throat as needed for mouth pain. 09/19/20  Yes Carmon Sails J, PA-C  lidocaine-prilocaine (EMLA) cream Apply 1 application topically as needed. Apply a teaspoon over port site at least 1 hour prior to lab appt. Do not rub in and cover with plastic wrap. 09/30/20  Yes Curt Bears, MD  lisinopril (ZESTRIL) 10 MG tablet TAKE ONE TABLET DAILY Patient taking differently: Take 10 mg by mouth daily. 08/12/20  Yes Elayne Snare, MD  metFORMIN (GLUCOPHAGE-XR) 750 MG 24 hr tablet TAKE 1 TABLET WITH DINNER Patient taking differently: Take 750 mg by mouth daily with supper. 11/11/20  Yes Elayne Snare, MD  ondansetron (ZOFRAN) 4 MG tablet Take 1 tablet (4 mg total) by mouth every 6 (six) hours as needed for nausea. 10/08/20  Yes Sheikh, Omair Latif, DO  OXYGEN Inhale 2 L into the lungs. At bedtime and during the day prn   Yes [provider]  potassium chloride SA (K-DUR,KLOR-CON) 20 MEQ tablet Take 20 mEq by mouth 2 (two) times daily.    Yes [provider]  prochlorperazine (COMPAZINE) 10 MG tablet Take 1 tablet (10 mg total)  by mouth  every 6 (six) hours as needed for nausea or vomiting. 09/02/20  Yes Curt Bears, MD  rosuvastatin (CRESTOR) 20 MG tablet Take 20 mg by mouth daily.   Yes [provider]  sotorasib (LUMAKRAS) 120 MG TABS Take 960 mg by mouth every morning. 01/13/21  Yes Curt Bears, MD  Tamsulosin HCl (FLOMAX) 0.4 MG CAPS Take 0.4 mg by mouth at bedtime.   Yes [provider]  Tiotropium Bromide Monohydrate (SPIRIVA RESPIMAT) 2.5 MCG/ACT AERS Inhale 2 puffs into the lungs daily. 02/06/21  Yes Collene Gobble, MD  vitamin B-12 (CYANOCOBALAMIN) 1000 MCG tablet Take 1,000 mcg by mouth at bedtime.   Yes [provider]  apixaban (ELIQUIS) 5 MG TABS tablet Take 1 tablet (5 mg total) by mouth 2 (two) times daily. Patient not taking: Reported on 03/14/2021 03/11/21   Pixie Casino, MD  prednisoLONE acetate (PRED FORTE) 1 % ophthalmic suspension Place 1 drop into the right eye 4 (four) times daily. Patient not taking: Reported on 03/14/2021 02/05/21   Hurman Horn, MD  spironolactone (ALDACTONE) 25 MG tablet Take 0.5 tablets (12.5 mg total) by mouth daily. Patient not taking: Reported on 03/14/2021 03/03/21 04/02/21  Thurnell Lose, MD    Allergies    Actos [pioglitazone], Spironolactone, Metformin and related, Niaspan [niacin er], and Wound dressing adhesive  Review of Systems   Review of Systems  Constitutional: Positive for fatigue. Negative for chills and fever.  Eyes: Negative for pain and visual disturbance.  Respiratory: Negative for cough and shortness of breath.   Cardiovascular: Negative for chest pain and palpitations.  Gastrointestinal: Negative for abdominal pain, nausea and vomiting.  Genitourinary: Negative for dysuria and hematuria.  Musculoskeletal: Negative for arthralgias and back pain.  Skin: Negative for color change and rash.  Neurological: Negative for syncope and headaches.  All other systems reviewed and are negative.   Physical Exam Updated  Vital Signs BP (!) 155/61   Pulse 67   Temp 98.1 F (36.7 C) (Oral)   Resp 20   SpO2 100%   Physical Exam Constitutional:      General: He is not in acute distress. HENT:     Head: Normocephalic and atraumatic.  Eyes:     Conjunctiva/sclera: Conjunctivae normal.     Pupils: Pupils are equal, round, and reactive to light.  Cardiovascular:     Rate and Rhythm: Normal rate. Rhythm irregular.     Pulses: Normal pulses.     Heart sounds: Murmur heard.      Comments: HR 60-70 BPM, frequent PVC Pulmonary:     Effort: Pulmonary effort is normal. No respiratory distress.     Comments: 2L [place] (baseline) Abdominal:     General: There is no distension.     Tenderness: There is no abdominal tenderness.  Skin:    General: Skin is warm and dry.  Neurological:     General: No focal deficit present.     Mental Status: He is alert. Mental status is at baseline.  Psychiatric:        Mood and Affect: Mood normal.        Behavior: Behavior normal.      Above, photo to note the discrepancy between telemetry (ventricular rate) and pulse oximeter heart rate  ED Results / Procedures / Treatments   Labs (all labs ordered are listed, but only abnormal results are displayed) Labs Reviewed  BASIC METABOLIC PANEL - Abnormal; Notable for the following components:      Result  Value   Glucose, Bld 191 (*)    Creatinine, Ser 1.42 (*)    GFR, Estimated 50 (*)    All other components within normal limits  CBC - Abnormal; Notable for the following components:   WBC 3.9 (*)    RBC 3.91 (*)    Hemoglobin 11.0 (*)    HCT 37.2 (*)    MCHC 29.6 (*)    RDW 15.9 (*)    Platelets 128 (*)    All other components within normal limits  BRAIN NATRIURETIC PEPTIDE - Abnormal; Notable for the following components:   B Natriuretic Peptide 356.0 (*)    All other components within normal limits  TROPONIN I (HIGH SENSITIVITY) - Abnormal; Notable for the following components:   Troponin I (High Sensitivity)  53 (*)    All other components within normal limits  MAGNESIUM    EKG EKG Interpretation  Date/Time:  Friday March 14 2021 10:28:10 EDT Ventricular Rate:  83 PR Interval:    QRS Duration: 84 QT Interval:  404 QTC Calculation: 474 R Axis:   -81 Text Interpretation: A Flutter with PVCs Left axis deviation Prolonged QT Abnormal ECG Confirmed by Octaviano Glow [phone]) on 03/14/2021 10:42:11 AM   Radiology DG Chest 2 View  Result Date: 03/14/2021 CLINICAL DATA:  Dyspnea, arrhythmia, lung cancer EXAM: CHEST - 2 VIEW COMPARISON:  02/26/2021 chest radiograph. FINDINGS: Surgical hardware from ACDF is partially visualized. Intact sternotomy wires. Right internal jugular Port-A-Cath terminates over the cavoatrial junction. Stable cardiomediastinal silhouette with mild cardiomegaly. No pneumothorax. Stable mild blunting of the right costophrenic angle. No left pleural effusion. Stable elevation of the right hemidiaphragm. No overt pulmonary edema. Focal right perihilar density and basilar left upper lobe lung mass appear stable. No new consolidative airspace disease. Mild bibasilar atelectasis. IMPRESSION: 1. Stable mild cardiomegaly without overt pulmonary edema. 2. Stable known focal right perihilar density and basilar left upper lobe lung mass. 3. Stable mild blunting of the right costophrenic angle, compatible with small right pleural effusion versus pleuroparenchymal scarring. Chronic elevation of the right hemidiaphragm. Mild bibasilar atelectasis. Electronically Signed   By: Ilona Sorrel M.D.   On: 03/14/2021 11:38    Procedures Procedures   Medications Ordered in ED Medications - No data to display  ED Course  I have reviewed the triage vital signs and the nursing notes.  Pertinent labs & imaging results that were available during my care of the patient were reviewed by me and considered in my medical decision making (see chart for details).  This patient complains of bradycardia,  fatigue (now resolved).   I suspect there may be an erroneous read of his heart rate from finger/pulse ox monitor at home.  See photo above.  His actual ventricular rate is 70 bpm.  The pulse ox records 35-37 bpm - likely related to his COPD, poor peripheral perfusion, and A Flutter artifact.  Echo 2 weeks ago showed stable CHF, no worsening or severe aortic stenosis.  I dont think we need to repeat the echo.  He is stable on his home 2L [place].  NO evidence of significant volume overload or new CHF exacerbation at this time.  I ordered, reviewed, and interpreted labs.  No life-threatening abnormalities were noted on these tests. I ordered imaging studies which included dg chest I independently visualized and interpreted imaging which showed no life-threatening abnormalities, and the monitor tracing which showed A flutter with HR 70, rate controlled, and frequent PVC Additional history was obtained from patient's  wife at bedside Previous records obtained and reviewed showing recent hospitalization course as noted    Clinical Course as of 03/14/21 1728  Fri Mar 14, 2021  1133 Note - HR is 70 bpm, NOT 42.  This is an erroneous reading from his pulse oximeter.  I've included a photo on the media tab [MT]  1254 Trop near recent baseline, doubt ACS. [MT]  1254 Cr also near recent baseline. [MT]  1254 BNP improved from prior hospitalization.  Today is 356 [MT]  1427 Patient is wife have been updated.  On reassessment he remains asymptomatic.  His heart rate is persistently 69 to 70 bpm.  We discussed the fact that a pulse oximeter is likely given false readings, and advised that they tell this to their doctors and any home health aides.  She does have a blood pressure cuff which she states gives an accurate reading, this morning was 70 bpm.  I advised that there is ever any question to check his heart rate with the blood pressure cuff. [MT]    Clinical Course User Index [MT] Quanita Barona, Carola Rhine, MD     Final Clinical Impression(s) / ED Diagnoses Final diagnoses:  Palpitations    Rx / DC Orders ED Discharge Orders    None       Wyvonnia Dusky, MD 03/14/21 1731

## 2021-03-14 NOTE — Telephone Encounter (Signed)
Pt wife called into triage she states that pt's HR is 34 she states that he is alert and oriented an just finished breakfast and is c/o fatigue and he "wants to go back to sleep". Earlier this am his BP was 166/82 HR 53 she did not write down his BP with this HR of 34.. wife-laura states that last time his HR was in the 30's she had to "put him in the hospital". Informed wife to take pt to the ER again. I will call the cardmaster and let her know.

## 2021-03-17 ENCOUNTER — Other Ambulatory Visit: Payer: Self-pay | Admitting: Internal Medicine

## 2021-03-17 ENCOUNTER — Other Ambulatory Visit: Payer: Medicare Other

## 2021-03-17 ENCOUNTER — Ambulatory Visit: Payer: Medicare Other

## 2021-03-17 ENCOUNTER — Ambulatory Visit: Payer: Medicare Other | Admitting: Internal Medicine

## 2021-03-17 ENCOUNTER — Other Ambulatory Visit: Payer: Self-pay | Admitting: Endocrinology

## 2021-03-18 ENCOUNTER — Encounter (HOSPITAL_COMMUNITY): Payer: Self-pay

## 2021-03-18 ENCOUNTER — Other Ambulatory Visit: Payer: Self-pay

## 2021-03-18 ENCOUNTER — Inpatient Hospital Stay: Payer: Medicare Other | Attending: Internal Medicine

## 2021-03-18 ENCOUNTER — Inpatient Hospital Stay: Payer: Medicare Other

## 2021-03-18 ENCOUNTER — Ambulatory Visit (HOSPITAL_COMMUNITY)
Admission: RE | Admit: 2021-03-18 | Discharge: 2021-03-18 | Disposition: A | Discharge status: Home / Self Care | Payer: Medicare Other | Source: Ambulatory Visit | Attending: Internal Medicine | Admitting: Internal Medicine

## 2021-03-18 DIAGNOSIS — Z95828 Presence of other vascular implants and grafts: Secondary | ICD-10-CM

## 2021-03-18 DIAGNOSIS — C3490 Malignant neoplasm of unspecified part of unspecified bronchus or lung: Secondary | ICD-10-CM | POA: Insufficient documentation

## 2021-03-18 DIAGNOSIS — I358 Other nonrheumatic aortic valve disorders: Secondary | ICD-10-CM | POA: Diagnosis not present

## 2021-03-18 DIAGNOSIS — C3412 Malignant neoplasm of upper lobe, left bronchus or lung: Secondary | ICD-10-CM | POA: Diagnosis present

## 2021-03-18 DIAGNOSIS — I110 Hypertensive heart disease with heart failure: Secondary | ICD-10-CM | POA: Diagnosis not present

## 2021-03-18 DIAGNOSIS — C3431 Malignant neoplasm of lower lobe, right bronchus or lung: Secondary | ICD-10-CM | POA: Diagnosis not present

## 2021-03-18 DIAGNOSIS — J90 Pleural effusion, not elsewhere classified: Secondary | ICD-10-CM | POA: Diagnosis not present

## 2021-03-18 DIAGNOSIS — C3491 Malignant neoplasm of unspecified part of right bronchus or lung: Secondary | ICD-10-CM

## 2021-03-18 DIAGNOSIS — I2510 Atherosclerotic heart disease of native coronary artery without angina pectoris: Secondary | ICD-10-CM | POA: Diagnosis not present

## 2021-03-18 LAB — CBC WITH DIFFERENTIAL (CANCER CENTER ONLY)
Abs Immature Granulocytes: 0.01 10*3/uL (ref 0.00–0.07)
Basophils Absolute: 0 10*3/uL (ref 0.0–0.1)
Basophils Relative: 1 %
Eosinophils Absolute: 0 10*3/uL (ref 0.0–0.5)
Eosinophils Relative: 1 %
HCT: 34.6 % — ABNORMAL LOW (ref 39.0–52.0)
Hemoglobin: 10.5 g/dL — ABNORMAL LOW (ref 13.0–17.0)
Immature Granulocytes: 0 %
Lymphocytes Relative: 19 %
Lymphs Abs: 0.8 10*3/uL (ref 0.7–4.0)
MCH: 27.6 pg (ref 26.0–34.0)
MCHC: 30.3 g/dL (ref 30.0–36.0)
MCV: 91.1 fL (ref 80.0–100.0)
Monocytes Absolute: 0.6 10*3/uL (ref 0.1–1.0)
Monocytes Relative: 14 %
Neutro Abs: 2.7 10*3/uL (ref 1.7–7.7)
Neutrophils Relative %: 65 %
Platelet Count: 132 10*3/uL — ABNORMAL LOW (ref 150–400)
RBC: 3.8 MIL/uL — ABNORMAL LOW (ref 4.22–5.81)
RDW: 16.1 % — ABNORMAL HIGH (ref 11.5–15.5)
WBC Count: 4.2 10*3/uL (ref 4.0–10.5)
nRBC: 0 % (ref 0.0–0.2)

## 2021-03-18 LAB — CMP (CANCER CENTER ONLY)
ALT: 16 U/L (ref 0–44)
AST: 23 U/L (ref 15–41)
Albumin: 3.4 g/dL — ABNORMAL LOW (ref 3.5–5.0)
Alkaline Phosphatase: 166 U/L — ABNORMAL HIGH (ref 38–126)
Anion gap: 13 (ref 5–15)
BUN: 20 mg/dL (ref 8–23)
CO2: 27 mmol/L (ref 22–32)
Calcium: 9.3 mg/dL (ref 8.9–10.3)
Chloride: 103 mmol/L (ref 98–111)
Creatinine: 1.49 mg/dL — ABNORMAL HIGH (ref 0.61–1.24)
GFR, Estimated: 47 mL/min — ABNORMAL LOW (ref >60)
Glucose, Bld: 137 mg/dL — ABNORMAL HIGH (ref 70–99)
Potassium: 4 mmol/L (ref 3.5–5.1)
Sodium: 143 mmol/L (ref 135–145)
Total Bilirubin: 0.7 mg/dL (ref 0.3–1.2)
Total Protein: 6.8 g/dL (ref 6.5–8.1)

## 2021-03-18 MED ORDER — SODIUM CHLORIDE 0.9% FLUSH
10.0000 mL | Freq: Once | INTRAVENOUS | Status: AC
  Administered 2021-03-18: 10 mL
  Filled 2021-03-18: qty 10

## 2021-03-18 MED ORDER — HEPARIN SOD (PORK) LOCK FLUSH 100 UNIT/ML IV SOLN
500.0000 [IU] | Freq: Once | INTRAVENOUS | Status: AC
  Administered 2021-03-18: 500 [IU] via INTRAVENOUS

## 2021-03-18 MED ORDER — HEPARIN SOD (PORK) LOCK FLUSH 100 UNIT/ML IV SOLN
INTRAVENOUS | Status: AC
  Filled 2021-03-18: qty 5

## 2021-03-19 NOTE — Telephone Encounter (Signed)
Will refax Dr. Lysbeth Penner note from 03/11/2021 to requesting office.   Darreld Mclean, PA-C 03/19/2021 11:47 AM

## 2021-03-19 NOTE — Telephone Encounter (Signed)
Wife of patient called. Wife states Periodontist never got a fax from our office. Please re-fax clearance to [phone]

## 2021-03-20 ENCOUNTER — Telehealth: Payer: Self-pay | Admitting: Internal Medicine

## 2021-03-20 ENCOUNTER — Other Ambulatory Visit: Payer: Self-pay

## 2021-03-20 ENCOUNTER — Inpatient Hospital Stay (HOSPITAL_BASED_OUTPATIENT_CLINIC_OR_DEPARTMENT_OTHER): Payer: Medicare Other | Admitting: Internal Medicine

## 2021-03-20 ENCOUNTER — Encounter: Payer: Self-pay | Admitting: Internal Medicine

## 2021-03-20 VITALS — BP 149/44 | HR 75 | Temp 96.8°F | Resp 17 | Ht 67.0 in | Wt 225.6 lb

## 2021-03-20 DIAGNOSIS — C3491 Malignant neoplasm of unspecified part of right bronchus or lung: Secondary | ICD-10-CM

## 2021-03-20 DIAGNOSIS — I110 Hypertensive heart disease with heart failure: Secondary | ICD-10-CM | POA: Diagnosis not present

## 2021-03-20 DIAGNOSIS — C3412 Malignant neoplasm of upper lobe, left bronchus or lung: Secondary | ICD-10-CM | POA: Diagnosis not present

## 2021-03-20 DIAGNOSIS — I10 Essential (primary) hypertension: Secondary | ICD-10-CM

## 2021-03-20 DIAGNOSIS — Z5111 Encounter for antineoplastic chemotherapy: Secondary | ICD-10-CM

## 2021-03-20 DIAGNOSIS — C3431 Malignant neoplasm of lower lobe, right bronchus or lung: Secondary | ICD-10-CM | POA: Diagnosis not present

## 2021-03-20 NOTE — Telephone Encounter (Signed)
Scheduled per los. Confirmed appts,

## 2021-03-20 NOTE — Progress Notes (Signed)
[place] [phone]   [phone]  OFFICE PROGRESS NOTE  [name], MD [address] [place] [address]  DIAGNOSIS: Stage IV (T4, N2, M1 a) non-small cell lung cancer, mucinous adenocarcinoma presented with bilateral large masses in the left upper lobe as well as the right lower lobe in addition to other small nodules in the right upper lobe with hilar and mediastinal lymphadenopathy diagnosed in August 2021.  Biomarker Findings Microsatellite status - MS-Stable Tumor Mutational Burden - 4 Muts/Mb Genomic Findings For a complete list of the genes assayed, please refer to the Appendix. KRAS G12C GNAS R201H 7 Disease relevant genes with no reportable alterations: ALK, BRAF, EGFR, ERBB2, MET, RET, ROS1  PDL1 Expression: 20%.  PRIOR THERAPY: Systemic chemotherapy with carboplatin for AUC of 5, Alimta 500 mg/M2 and Keytruda 200 mg IV every 3 weeks.  First dose 09/09/2020.  Status post 6 cycles.  Starting from cycle #5 he has been on maintenance treatment with Alimta and Keytruda.  CURRENT THERAPY: Lumakras (Sotorasib) 960 mg p.o. daily.  First dose started 01/25/2021.  Status post 2 months of treatment.  INTERVAL HISTORY: Randall Wilkins 80 y.o. male returns to the clinic today for follow-up visit accompanied by his wife.  The patient is feeling fine today with no concerning complaints except for the baseline shortness of breath and he is currently on home oxygen.  He has atrial fibrillation and other cardiac conditions that may be contributing to his condition.  He denied having any chest pain, cough or hemoptysis.  He denied having any fever or chills.  He has no nausea, vomiting, diarrhea or constipation.  He has no headache or visual changes.  He lost few pounds since his last visit but he eats good.  The patient continues to tolerate his treatment with Lumakras (Sotorasib) fairly well.  He is here for evaluation and repeat CT  scan of the chest, abdomen pelvis for restaging of his disease.    MEDICAL HISTORY: Past Medical History:  Diagnosis Date  . Aortic stenosis, mild 11/14/2013  . Aortic valve sclerosis 03/29/2015  . Bilateral leg edema 05/21/2014  . CAD (coronary artery disease)   . Chronic diastolic congestive heart failure ([place])   . Chronic kidney disease (CKD), stage III (moderate) (H[facility])   . Diabetes 1.5, managed as type 1 ([place]) 02/04/2013  . Diabetic peripheral neuropathy associated with type 1 diabetes mellitus ([place]) 11/14/2013  . Dysphagia   . Dyspnea on exertion 03/21/2015  . Exogenous obesity   . Gout   . Heart attack ([place])   . History of nuclear stress test 07/2011   dipyridamole; fixed inferolateral defect, worse at stress than rest; no reversible ischemia; low risk scan   . Hyperlipidemia   . Hypertension   . Hypothyroidism   . Insulin dependent diabetes mellitus   . Left foot drop   . Left main coronary artery disease 03/28/2015  . lung ca dx'd 08/2020  . Memory loss   . Obesity (BMI 30-39.9) 11/14/2013  . Obstructive sleep apnea 03/21/2015  . OSA on CPAP    uses a cpap  . Peptic ulcer with hemorrhage 03/28/2015  . Peripheral neuropathy   . Pneumonia   . Rhabdomyolysis   . 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 Stroke ([place])    L patietal with small scattered lacunar infarcts  . Thrombocytopenia ([place]) 03/21/2015  .  Venous insufficiency   . Weakness generalized 03/21/2015    ALLERGIES:  is allergic to actos [pioglitazone], spironolactone, metformin and related, niaspan [niacin er], and wound dressing adhesive.  MEDICATIONS:  Current Outpatient Medications  Medication Sig Dispense Refill  . albuterol (VENTOLIN HFA) 108 (90 Base) MCG/ACT inhaler Inhale 2 puffs into the lungs every 6 (six) hours as needed for wheezing or shortness of breath. 8 g 6  . allopurinol (ZYLOPRIM) 100 MG tablet Take 1 tablet (100 mg total) by mouth daily. 30 tablet 6  .  amitriptyline (ELAVIL) 75 MG tablet Take 75 mg by mouth at bedtime.    Marland Kitchen amLODipine (NORVASC) 10 MG tablet TAKE ONE TABLET BY MOUTH ONCE DAILY 30 tablet 0  . apixaban (ELIQUIS) 2.5 MG TABS tablet Take 2.5 mg by mouth 2 (two) times daily.    Marland Kitchen apixaban (ELIQUIS) 5 MG TABS tablet Take 1 tablet (5 mg total) by mouth 2 (two) times daily. (Patient not taking: Reported on 03/14/2021) 60 tablet 11  . Blood Glucose Monitoring Suppl (CONTOUR NEXT MONITOR) w/Device KIT 1 Device by Does not apply route 3 (three) times daily. Use to check blood sugars 3 times daily. Dx Code E13.9 1 kit 2  . CONTOUR NEXT TEST test strip USE 1 STRIP TO CHECK GLUCOSE 4 TIMES DAILY 300 each 1  . furosemide (LASIX) 20 MG tablet Take 60 mg in the morning and 40 mg (Patient taking differently: Take 40-60 mg by mouth See admin instructions. Take 60 mg in the morning and 40 mg in the evening.) 120 tablet 0  . insulin degludec (TRESIBA FLEXTOUCH) 200 UNIT/ML FlexTouch Pen Inject 24 Units into the skin at bedtime. (Patient taking differently: Inject 16 Units into the skin at bedtime.)    . insulin lispro (HUMALOG) 100 UNIT/ML cartridge Inject 5-15 Units into the skin 3 (three) times daily with meals. Sliding scale.    . Insulin Pen Needle (EASY COMFORT PEN NEEDLES) 33G X 4 MM MISC 1 each by Does not apply route See admin instructions. Use to inject insulin 5 times daily. 150 each 3  . levothyroxine (SYNTHROID, LEVOTHROID) 75 MCG tablet Take 75 mcg by mouth daily before breakfast.   1  . lidocaine (XYLOCAINE) 2 % solution Use as directed 15 mLs in the mouth or throat as needed for mouth pain. 100 mL 0  . lidocaine-prilocaine (EMLA) cream Apply 1 application topically as needed. Apply a teaspoon over port site at least 1 hour prior to lab appt. Do not rub in and cover with plastic wrap. 30 g 0  . lisinopril (ZESTRIL) 10 MG tablet TAKE ONE TABLET DAILY (Patient taking differently: Take 10 mg by mouth daily.) 90 tablet 2  . metFORMIN  (GLUCOPHAGE-XR) 750 MG 24 hr tablet TAKE 1 TABLET WITH DINNER 90 tablet 1  . ondansetron (ZOFRAN) 4 MG tablet Take 1 tablet (4 mg total) by mouth every 6 (six) hours as needed for nausea. 20 tablet 0  . OXYGEN Inhale 2 L into the lungs. At bedtime and during the day prn    . potassium chloride SA (K-DUR,KLOR-CON) 20 MEQ tablet Take 20 mEq by mouth 2 (two) times daily.     . prednisoLONE acetate (PRED FORTE) 1 % ophthalmic suspension Place 1 drop into the right eye 4 (four) times daily. (Patient not taking: Reported on 03/14/2021) 10 mL 0  . prochlorperazine (COMPAZINE) 10 MG tablet Take 1 tablet (10 mg total) by mouth every 6 (six) hours as needed for nausea or vomiting.  30 tablet 0  . rosuvastatin (CRESTOR) 20 MG tablet Take 20 mg by mouth daily.    . sotorasib 120 MG TABS TAKE 960 MG BY MOUTH EVERY MORNING. 240 tablet 3  . spironolactone (ALDACTONE) 25 MG tablet Take 0.5 tablets (12.5 mg total) by mouth daily. (Patient not taking: Reported on 03/14/2021) 15 tablet 0  . Tamsulosin HCl (FLOMAX) 0.4 MG CAPS Take 0.4 mg by mouth at bedtime.    . Tiotropium Bromide Monohydrate (SPIRIVA RESPIMAT) 2.5 MCG/ACT AERS Inhale 2 puffs into the lungs daily. 4 g 3  . vitamin B-12 (CYANOCOBALAMIN) 1000 MCG tablet Take 1,000 mcg by mouth at bedtime.     No current facility-administered medications for this visit.    SURGICAL HISTORY:  Past Surgical History:  Procedure Laterality Date  . BACK SURGERY  2002   lumbosacral. 11 back surgeries total  . BRONCHIAL BIOPSY  08/06/2020   Procedure: BRONCHIAL BIOPSIES;  Surgeon: Collene Gobble, MD;  Location: [facility] ENDOSCOPY;  Service: Pulmonary;;  . BRONCHIAL BRUSHINGS  08/06/2020   Procedure: BRONCHIAL BRUSHINGS;  Surgeon: Collene Gobble, MD;  Location[facility] ENDOSCOPY;  Service: Pulmonary;;  . BRONCHIAL NEEDLE ASPIRATION BIOPSY  08/06/2020   Procedure: BRONCHIAL NEEDLE ASPIRATION BIOPSIES;  Surgeon: Collene Gobble, MD;  Location: [facility] ENDOSCOPY;  Service: Pulmonary;;  .  Carotid Doppler  03/2013   bilat bulb/prox ICAs - mild amount of fibrous plaque with no evidence of diameter reduction  . CARPAL TUNNEL RELEASE Bilateral 08/09/2014   Procedure: BILATERAL CARPAL TUNNEL RELEASE;  Surgeon: Daryll Brod, MD;  Location: [facility];  Service: Orthopedics;  Laterality: Bilateral;  ANESTHESIA:  IV REGIONAL BIL FAB  . CHOLECYSTECTOMY    . COLONOSCOPY    . CORONARY ANGIOPLASTY  10/13/1996  . CORONARY ANGIOPLASTY  09/21/1989   emergency PTCA  . CORONARY ANGIOPLASTY  10/13/1996   Multi-Link diagonal & OD stenting (Dr. Marella Chimes)  . CORONARY ANGIOPLASTY  12/03/1997   disease of mid DX-1 ~50% & in mid PLA & PDA (distal lesions) (Dr. Marella Chimes)   . CORONARY ANGIOPLASTY  10/14/1999   progression of disease distal PLA & PDA; progression of disease prox RCA - moderate (Dr. Marella Chimes)   . CORONARY ANGIOPLASTY WITH STENT PLACEMENT  04/04/2004   4.0x81m non-DES (thrombectomy via AngioJet) to RCA for high grade stenosis (Dr. RMarella Chimes  . CORONARY ARTERY BYPASS GRAFT N/A 03/29/2015   Procedure: CORONARY ARTERY BYPASS GRAFTING TIMES TWO USING LEFT INTERNAL MAMMARY ARTERY AND RIGHT LEG GREATER SAPHENOUS VEIN HARVESTED ENDOSCOPICALLY.;  Surgeon: CRexene Alberts MD;  [place]  Service: Open Heart Surgery;  Laterality: N/A;  . ESOPHAGOGASTRODUODENOSCOPY N/A 03/27/2015   Procedure: ESOPHAGOGASTRODUODENOSCOPY (EGD);  Surgeon: MClarene Essex MD;  [facility];  Service: Endoscopy;  Laterality: N/A;  possible dilation  . ESOPHAGOGASTRODUODENOSCOPY N/A 10/05/2020   Procedure: ESOPHAGOGASTRODUODENOSCOPY (EGD);  Surgeon: OArta Silence MD;  [facility];  Service: Endoscopy;  Laterality: N/A;  . FINE NEEDLE ASPIRATION  08/06/2020   Procedure: FINE NEEDLE ASPIRATION (FNA) LINEAR;  Surgeon: BCollene Gobble MD;  [facility];  Service: Pulmonary;;  . IR IMAGING GUIDED PORT INSERTION  10/07/2020  . LEFT HEART CATHETERIZATION WITH  CORONARY ANGIOGRAM N/A 03/28/2015   Procedure: LEFT HEART CATHETERIZATION WITH CORONARY ANGIOGRAM;  Surgeon: Peter M JMartinique MD;  [facility];  Service: Cardiovascular;  Laterality: N/A;  . SINUS ENDO W/FUSION    . TONSILLECTOMY    . TRANSTHORACIC ECHOCARDIOGRAM  08/08/2013   EF  55-60%, mild conc hypertrophy, grade 1 diastolic dysfunction; AV with mild stenosis; LA & RA mildly dilated  . VIDEO BRONCHOSCOPY WITH ENDOBRONCHIAL NAVIGATION N/A 08/06/2020   Procedure: VIDEO BRONCHOSCOPY WITH ENDOBRONCHIAL NAVIGATION;  Surgeon: Collene Gobble, MD;  Location: [facility] ENDOSCOPY;  Service: Pulmonary;  Laterality: N/A;  . VIDEO BRONCHOSCOPY WITH ENDOBRONCHIAL ULTRASOUND N/A 08/06/2020   Procedure: VIDEO BRONCHOSCOPY WITH ENDOBRONCHIAL ULTRASOUND;  Surgeon: Collene Gobble, MD;  Location: [facility] ENDOSCOPY;  Service: Pulmonary;  Laterality: N/A;    REVIEW OF SYSTEMS:  Constitutional: positive for fatigue and weight loss Eyes: negative Ears, nose, mouth, throat, and face: negative Respiratory: positive for dyspnea on exertion Cardiovascular: negative Gastrointestinal: negative Genitourinary:negative Integument/breast: negative Hematologic/lymphatic: negative Musculoskeletal:positive for muscle weakness Neurological: negative Behavioral/Psych: negative Endocrine: negative Allergic/Immunologic: negative   PHYSICAL EXAMINATION: General appearance: alert, cooperative, fatigued and no distress Head: Normocephalic, without obvious abnormality, atraumatic Neck: no adenopathy, no JVD, supple, symmetrical, trachea midline and thyroid not enlarged, symmetric, no tenderness/mass/nodules Lymph nodes: Cervical, supraclavicular, and axillary nodes normal. Resp: clear to auscultation bilaterally Back: symmetric, no curvature. ROM normal. No CVA tenderness. Cardio: regular rate and rhythm, S1, S2 normal, no murmur, click, rub or gallop GI: soft, non-tender; bowel sounds normal; no masses,  no  organomegaly Extremities: extremities normal, atraumatic, no cyanosis or edema Neurologic: Alert and oriented X 3, normal strength and tone. Normal symmetric reflexes. Normal coordination and gait  ECOG PERFORMANCE STATUS: 1 - Symptomatic but completely ambulatory  Blood pressure (!) 149/44, pulse 75, temperature (!) 96.8 F (36 C), temperature source Tympanic, resp. rate 17, height '5\' 7"'  (1.702 m), weight 225 lb 9.6 oz (102.3 kg), SpO2 (!) 83 %.  LABORATORY DATA: Lab Results  Component Value Date   WBC 4.2 03/18/2021   HGB 10.5 (L) 03/18/2021   HCT 34.6 (L) 03/18/2021   MCV 91.1 03/18/2021   PLT 132 (L) 03/18/2021      Chemistry      Component Value Date/Time   NA 143 03/18/2021 1217   K 4.0 03/18/2021 1217   CL 103 03/18/2021 1217   CO2 27 03/18/2021 1217   BUN 20 03/18/2021 1217   CREATININE 1.49 (H) 03/18/2021 1217   CREATININE 1.30 (H) 04/30/2020 1044      Component Value Date/Time   CALCIUM 9.3 03/18/2021 1217   ALKPHOS 166 (H) 03/18/2021 1217   AST 23 03/18/2021 1217   ALT 16 03/18/2021 1217   BILITOT 0.7 03/18/2021 1217       RADIOGRAPHIC STUDIES: CT Abdomen Pelvis Wo Contrast  Result Date: 03/19/2021 CLINICAL DATA:  Non-small cell lung cancer restaging, ongoing immunotherapy EXAM: CT CHEST, ABDOMEN AND PELVIS WITHOUT CONTRAST TECHNIQUE: Multidetector CT imaging of the chest, abdomen and pelvis was performed following the standard protocol without IV contrast. Oral enteric contrast was administered. COMPARISON:  01/10/2021 FINDINGS: CT CHEST FINDINGS Cardiovascular: Right chest port catheter. Aortic atherosclerosis. Aortic valve calcifications. Cardiomegaly. Three-vessel coronary artery calcifications and/or stents. No pericardial effusion. Mediastinum/Nodes: No enlarged mediastinal, hilar, or axillary lymph nodes. Thyroid gland, trachea, and esophagus demonstrate no significant findings. Lungs/Pleura: Slightly diminished right pleural effusion. Unchanged trace left  pleural effusion. Significant interval decrease in masslike consolidation of the lingula, dominant central component measuring approximately 4.8 x 3.6 cm, previously 7.2 x 4.2 cm when measured similarly (series 6, image 81). Significant interval decrease in masslike consolidation of the dependent right upper lobe, dominant central component measuring approximately 2.9 x 2.3 cm, previously 4.3 x 3.8 cm (series 6, image 71). Interval resolution of previously seen  nodularity in the left lung base (series 6, image 128). There is redemonstrated severe fibrotic scarring or congenital cystic malformation of the right lower lobe (series 6, image 108). Musculoskeletal: No suspicious chest wall mass or suspicious bone lesions identified. Unchanged superficial subcutaneous rounded lesion overlying the left scapula, likely a small cutaneous inclusion cyst (series 2, image 17). CT ABDOMEN PELVIS FINDINGS Hepatobiliary: No focal liver abnormality is seen. Status post cholecystectomy. No biliary dilatation. Pancreas: Unremarkable. No pancreatic ductal dilatation or surrounding inflammatory changes. Spleen: Normal in size without significant abnormality. Adrenals/Urinary Tract: Adrenal glands are unremarkable. Redemonstrated partially exophytic mass of the lateral midportion of the left kidney, better characterized as suspicious for renal cell carcinoma by prior contrast enhanced CT (series 2, image 79). Bladder is unremarkable. Stomach/Bowel: Stomach is within normal limits. Appendix appears diminutive although normal. No evidence of bowel wall thickening, distention, or inflammatory changes. Vascular/Lymphatic: Aortic atherosclerosis. No enlarged abdominal or pelvic lymph nodes. Reproductive: No mass or other abnormality. Other: No abdominal wall hernia or abnormality. No abdominopelvic ascites. Musculoskeletal: No acute or significant osseous findings. Unchanged partially calcified subcutaneous mass overlying the left gluteal  musculature and sacrum, measuring approximately 13.1 x 6.4 cm (series 2, image 116). IMPRESSION: 1. Significant interval decrease in masslike consolidation of the lingula, dependent right upper lobe, and left lung base. Findings are consistent with treatment response. 2. Slightly diminished right pleural effusion. Unchanged trace left pleural effusion. 3. No noncontrast evidence of metastatic disease within the abdomen or pelvis. 4. Redemonstrated partially exophytic mass of the lateral midportion of the left kidney, better characterized as suspicious for renal cell carcinoma by prior contrast enhanced CT. 5. Unchanged partially calcified subcutaneous mass overlying the left gluteal musculature and sacrum, of uncertain nature, soft tissue metastasis or other primary soft tissue malignancy not excluded. 6. Coronary artery disease. 7. Aortic valve calcifications. Correlate for echocardiographic evidence of aortic valve dysfunction. Aortic Atherosclerosis (ICD10-I70.0). Electronically Signed   By: Eddie Candle M.D.   On: 03/19/2021 11:51   DG Chest 2 View  Result Date: 03/14/2021 CLINICAL DATA:  Dyspnea, arrhythmia, lung cancer EXAM: CHEST - 2 VIEW COMPARISON:  02/26/2021 chest radiograph. FINDINGS: Surgical hardware from ACDF is partially visualized. Intact sternotomy wires. Right internal jugular Port-A-Cath terminates over the cavoatrial junction. Stable cardiomediastinal silhouette with mild cardiomegaly. No pneumothorax. Stable mild blunting of the right costophrenic angle. No left pleural effusion. Stable elevation of the right hemidiaphragm. No overt pulmonary edema. Focal right perihilar density and basilar left upper lobe lung mass appear stable. No new consolidative airspace disease. Mild bibasilar atelectasis. IMPRESSION: 1. Stable mild cardiomegaly without overt pulmonary edema. 2. Stable known focal right perihilar density and basilar left upper lobe lung mass. 3. Stable mild blunting of the right  costophrenic angle, compatible with small right pleural effusion versus pleuroparenchymal scarring. Chronic elevation of the right hemidiaphragm. Mild bibasilar atelectasis. Electronically Signed   By: Ilona Sorrel M.D.   On: 03/14/2021 11:38   DG Chest 2 View  Result Date: 02/26/2021 CLINICAL DATA:  Weakness on home oxygen. EXAM: CHEST - 2 VIEW COMPARISON:  November 05, 2020 FINDINGS: RIGHT IJ Port-A-Cath in situ. Post median sternotomy for CABG. Heart size remains enlarged. Opacity at the RIGHT lung base and LEFT mid chest as on the prior study. Potentially diminished effusion in the RIGHT chest compared to previous imaging. On limited assessment no acute skeletal process. IMPRESSION: 1. Opacity at the RIGHT lung base and LEFT mid chest, similar to the prior study. Patient with bilateral  lung masses. No definite acute process. Difficult to exclude superimposed pneumonia given pattern of disease. 2. Potentially diminished RIGHT pleural effusion. Electronically Signed   By: Zetta Bills M.D.   On: 02/26/2021 13:28   CT Chest Wo Contrast  Result Date: 03/19/2021 CLINICAL DATA:  Non-small cell lung cancer restaging, ongoing immunotherapy EXAM: CT CHEST, ABDOMEN AND PELVIS WITHOUT CONTRAST TECHNIQUE: Multidetector CT imaging of the chest, abdomen and pelvis was performed following the standard protocol without IV contrast. Oral enteric contrast was administered. COMPARISON:  01/10/2021 FINDINGS: CT CHEST FINDINGS Cardiovascular: Right chest port catheter. Aortic atherosclerosis. Aortic valve calcifications. Cardiomegaly. Three-vessel coronary artery calcifications and/or stents. No pericardial effusion. Mediastinum/Nodes: No enlarged mediastinal, hilar, or axillary lymph nodes. Thyroid gland, trachea, and esophagus demonstrate no significant findings. Lungs/Pleura: Slightly diminished right pleural effusion. Unchanged trace left pleural effusion. Significant interval decrease in masslike consolidation of the  lingula, dominant central component measuring approximately 4.8 x 3.6 cm, previously 7.2 x 4.2 cm when measured similarly (series 6, image 81). Significant interval decrease in masslike consolidation of the dependent right upper lobe, dominant central component measuring approximately 2.9 x 2.3 cm, previously 4.3 x 3.8 cm (series 6, image 71). Interval resolution of previously seen nodularity in the left lung base (series 6, image 128). There is redemonstrated severe fibrotic scarring or congenital cystic malformation of the right lower lobe (series 6, image 108). Musculoskeletal: No suspicious chest wall mass or suspicious bone lesions identified. Unchanged superficial subcutaneous rounded lesion overlying the left scapula, likely a small cutaneous inclusion cyst (series 2, image 17). CT ABDOMEN PELVIS FINDINGS Hepatobiliary: No focal liver abnormality is seen. Status post cholecystectomy. No biliary dilatation. Pancreas: Unremarkable. No pancreatic ductal dilatation or surrounding inflammatory changes. Spleen: Normal in size without significant abnormality. Adrenals/Urinary Tract: Adrenal glands are unremarkable. Redemonstrated partially exophytic mass of the lateral midportion of the left kidney, better characterized as suspicious for renal cell carcinoma by prior contrast enhanced CT (series 2, image 79). Bladder is unremarkable. Stomach/Bowel: Stomach is within normal limits. Appendix appears diminutive although normal. No evidence of bowel wall thickening, distention, or inflammatory changes. Vascular/Lymphatic: Aortic atherosclerosis. No enlarged abdominal or pelvic lymph nodes. Reproductive: No mass or other abnormality. Other: No abdominal wall hernia or abnormality. No abdominopelvic ascites. Musculoskeletal: No acute or significant osseous findings. Unchanged partially calcified subcutaneous mass overlying the left gluteal musculature and sacrum, measuring approximately 13.1 x 6.4 cm (series 2, image  116). IMPRESSION: 1. Significant interval decrease in masslike consolidation of the lingula, dependent right upper lobe, and left lung base. Findings are consistent with treatment response. 2. Slightly diminished right pleural effusion. Unchanged trace left pleural effusion. 3. No noncontrast evidence of metastatic disease within the abdomen or pelvis. 4. Redemonstrated partially exophytic mass of the lateral midportion of the left kidney, better characterized as suspicious for renal cell carcinoma by prior contrast enhanced CT. 5. Unchanged partially calcified subcutaneous mass overlying the left gluteal musculature and sacrum, of uncertain nature, soft tissue metastasis or other primary soft tissue malignancy not excluded. 6. Coronary artery disease. 7. Aortic valve calcifications. Correlate for echocardiographic evidence of aortic valve dysfunction. Aortic Atherosclerosis (ICD10-I70.0). Electronically Signed   By: Eddie Candle M.D.   On: 03/19/2021 11:51   ECHOCARDIOGRAM COMPLETE  Result Date: 02/27/2021    ECHOCARDIOGRAM REPORT   Patient Name:   Randall Wilkins Date of Exam: 02/27/2021 Medical Rec #:  [id number]      Height:       67.0 in Accession #:  [id number]     Weight:       222.0 lb Date of Birth:  1941/10/24      BSA:          2.114 m Patient Age:    88 years       BP:           156/74 mmHg Patient Gender: M              HR:           57 bpm. Exam Location:  Inpatient Procedure: 2D Echo Indications:    dyspnea  History:        Patient has prior history of Echocardiogram examinations, most                 recent 03/22/2020. CAD, Abnormal ECG, Arrythmias:symptomatic                 bradycardia; Risk Factors:Diabetes, Dyslipidemia and Sleep                 Apnea.  Sonographer:    Johny Chess Referring Phys: [place]  1. Left ventricular ejection fraction, by estimation, is 40 to 45%. The left ventricle has mildly decreased function. The left ventricle demonstrates global  hypokinesis. There is mild asymmetric left ventricular hypertrophy. Left ventricular diastolic parameters are indeterminate.  2. Right ventricular systolic function was not well visualized. The right ventricular size is not well visualized. There is severely elevated pulmonary artery systolic pressure. The estimated right ventricular systolic pressure is 28.4 mmHg.  3. Right atrial size was mildly dilated.  4. The mitral valve is grossly normal. Trivial mitral valve regurgitation.  5. The aortic valve is grossly normal. There is mild calcification of the aortic valve. There is mild thickening of the aortic valve. Aortic valve regurgitation is trivial. Mild aortic valve stenosis. FINDINGS  Left Ventricle: Left ventricular ejection fraction, by estimation, is 40 to 45%. The left ventricle has mildly decreased function. The left ventricle demonstrates global hypokinesis. The left ventricular internal cavity size was normal in size. There is  mild asymmetric left ventricular hypertrophy. Left ventricular diastolic parameters are indeterminate. Right Ventricle: The right ventricular size is not well visualized. Right vetricular wall thickness was not well visualized. Right ventricular systolic function was not well visualized. There is severely elevated pulmonary artery systolic pressure. The tricuspid regurgitant velocity is 4.33 m/s, and with an assumed right atrial pressure of 3 mmHg, the estimated right ventricular systolic pressure is 13.2 mmHg. Left Atrium: Left atrial size was normal in size. Right Atrium: Right atrial size was mildly dilated. Pericardium: There is no evidence of pericardial effusion. Mitral Valve: The mitral valve is grossly normal. Trivial mitral valve regurgitation. Tricuspid Valve: The tricuspid valve is normal in structure. Tricuspid valve regurgitation is mild. Aortic Valve: The aortic valve is grossly normal. There is mild calcification of the aortic valve. There is mild thickening of the  aortic valve. Aortic valve regurgitation is trivial. Mild aortic stenosis is present. Aortic valve mean gradient measures 16.0 mmHg. Aortic valve peak gradient measures 30.3 mmHg. Aortic valve area, by VTI measures 0.89 cm. Pulmonic Valve: The pulmonic valve was normal in structure. Pulmonic valve regurgitation is not visualized. Aorta: The aortic root and ascending aorta are structurally normal, with no evidence of dilitation. IAS/Shunts: The atrial septum is grossly normal.  LEFT VENTRICLE PLAX 2D LVIDd:         5.10 cm LVIDs:  4.00 cm LV PW:         1.30 cm LV IVS:        1.10 cm LVOT diam:     2.00 cm LV SV:         54 LV SV Index:   25 LVOT Area:     3.14 cm  IVC IVC diam: 1.90 cm LEFT ATRIUM             Index       RIGHT ATRIUM           Index LA diam:        4.40 cm 2.08 cm/m  RA Area:     19.10 cm LA Vol (A2C):   67.3 ml 31.84 ml/m RA Volume:   49.50 ml  23.42 ml/m LA Vol (A4C):   64.0 ml 30.27 ml/m LA Biplane Vol: 67.1 ml 31.74 ml/m  AORTIC VALVE AV Area (Vmax):    0.93 cm AV Area (Vmean):   0.95 cm AV Area (VTI):     0.89 cm AV Vmax:           275.33 cm/s AV Vmean:          180.667 cm/s AV VTI:            0.603 m AV Peak Grad:      30.3 mmHg AV Mean Grad:      16.0 mmHg LVOT Vmax:         81.77 cm/s LVOT Vmean:        54.900 cm/s LVOT VTI:          0.171 m LVOT/AV VTI ratio: 0.28  AORTA Ao Root diam: 3.00 cm Ao Asc diam:  3.30 cm MITRAL VALVE                TRICUSPID VALVE MV Area (PHT): 5.75 cm     TR Peak grad:   75.0 mmHg MV Decel Time: 132 msec     TR Vmax:        433.00 cm/s MV E velocity: 136.00 cm/s MV A velocity: 50.80 cm/s   SHUNTS MV E/A ratio:  2.68         Systemic VTI:  0.17 m                             Systemic Diam: 2.00 cm Mertie Moores MD Electronically signed by Mertie Moores MD Signature Date/Time: 02/27/2021/1:20:59 PM    Final    OCT, Retina - OU - Both Eyes  Result Date: 02/19/2021 Right Eye Quality was good. Scan locations included subfoveal. Central Foveal  Thickness: 335. Progression has no prior data. Findings include abnormal foveal contour. Left Eye Quality was good. Scan locations included subfoveal. Central Foveal Thickness: 314. Progression has been stable. Findings include epiretinal membrane. Notes Improving OD, 1 week post vitrectomy membrane peel OS with minor epiretinal membrane in the temporal aspect of the macula, no foveal distortion   ASSESSMENT AND PLAN: This is a very pleasant 80 years old white male diagnosed with a stage IV (T4, N2, M1 a) non-small cell lung cancer, mucinous adenocarcinoma presented with bilateral large masses in the left upper lobe as well as the right upper lobe in addition to other pulmonary nodules in the right upper lobe with hilar and mediastinal lymphadenopathy diagnosed in August 2021. Molecular study showed positive KRAS G12C mutation and PD-L1 expression of 20%. The patient is currently undergoing systemic chemotherapy  with carboplatin for AUC of 5, Alimta 500 mg/M2 and Keytruda 200 mg IV every 3 weeks status post 6 cycles. Starting from cycle #5 the patient is on maintenance treatment with Alimta and Keytruda every 3 weeks.  His treatment was discontinued secondary to disease progression.  The patient is currently on oral treatment with Lumakras (Sotorasib) 960 mg p.o. daily started on 01/25/2021.   The patient has been tolerating this treatment well with no concerning adverse effects. He had repeat CT scan of the chest, abdomen pelvis performed recently.  I personally and independently reviewed the scan images and discussed the results and showed the images to the patient and his wife today.  His scan showed significant improvement of his disease with decrease in the opacity on the right and left lung. I recommended for the patient to continue his current treatment with Lumakras (Sotorasib) with the same dose. I will see him back for follow-up visit in 3 weeks for evaluation before he goes on vacation during the  months of May to the beach. For the hypertension, the patient was advised to continue on his current blood pressure medication and to monitor it closely at home. For the shortness of breath, this is multifactorial secondary to his current disease, COPD and cardiac condition.  He is followed by cardiology. The patient was advised to call immediately if he has any concerning symptoms in the interval.  The patient voices understanding of current disease status and treatment options and is in agreement with the current care plan.  All questions were answered. The patient knows to call the clinic with any problems, questions or concerns. We can certainly see the patient much sooner if necessary.  Disclaimer: This note was dictated with voice recognition software. Similar sounding words can inadvertently be transcribed and may not be corrected upon review.

## 2021-03-31 ENCOUNTER — Other Ambulatory Visit: Payer: Self-pay | Admitting: Internal Medicine

## 2021-03-31 NOTE — Telephone Encounter (Signed)
Rx has been sent to the pharmacy electronically. ° °

## 2021-04-02 ENCOUNTER — Ambulatory Visit (INDEPENDENT_AMBULATORY_CARE_PROVIDER_SITE_OTHER): Payer: Medicare Other | Admitting: Ophthalmology

## 2021-04-02 ENCOUNTER — Encounter (INDEPENDENT_AMBULATORY_CARE_PROVIDER_SITE_OTHER): Payer: Self-pay | Admitting: Ophthalmology

## 2021-04-02 ENCOUNTER — Other Ambulatory Visit: Payer: Self-pay

## 2021-04-02 DIAGNOSIS — H35372 Puckering of macula, left eye: Secondary | ICD-10-CM | POA: Diagnosis not present

## 2021-04-02 DIAGNOSIS — H35351 Cystoid macular degeneration, right eye: Secondary | ICD-10-CM | POA: Diagnosis not present

## 2021-04-02 DIAGNOSIS — H35371 Puckering of macula, right eye: Secondary | ICD-10-CM

## 2021-04-02 MED ORDER — KETOROLAC TROMETHAMINE 0.5 % OP SOLN
1.0000 [drp] | Freq: Four times a day (QID) | OPHTHALMIC | 4 refills | Status: DC
Start: 2021-04-02 — End: 2021-05-29

## 2021-04-02 NOTE — Assessment & Plan Note (Signed)
We will treat with topical NSAID, ketorolac 1 drop right eye 4 times daily.  There may be some role of macular hypoxia at night as the patient does use nightly oxygen and was a former CPAP user.

## 2021-04-02 NOTE — Assessment & Plan Note (Signed)
6 weeks post vitrectomy membrane peel with initial improvement in macular Findings post surgery yet now with new onset CME.

## 2021-04-02 NOTE — Progress Notes (Signed)
04/02/2021     CHIEF COMPLAINT Patient presents for Post-op Follow-up (6 Wk F/U OU, post op OD//Pt sts VA is blurry OD. Pt sts VA stable OS. No ocular pain OU. Pt is off all eye drops at this time.)   HISTORY OF PRESENT ILLNESS: Randall Wilkins is a 80 y.o. male who presents to the clinic today for:   HPI    Post-op Follow-up    In right eye.  Vision is stable. Additional comments: 6 Wk F/U OU, post op OD  Pt sts VA is blurry OD. Pt sts VA stable OS. No ocular pain OU. Pt is off all eye drops at this time.       Last edited by Rockie Neighbours, [place] on 04/02/2021 10:20 AM. (History)      Referring physician: [name], MD [address],  [place] [address]  HISTORICAL INFORMATION:   Selected notes from the MEDICAL RECORD NUMBER    Lab Results  Component Value Date   HGBA1C 6.7 (H) 02/26/2021     CURRENT MEDICATIONS: Current Outpatient Medications (Ophthalmic Drugs)  Medication Sig  . prednisoLONE acetate (PRED FORTE) 1 % ophthalmic suspension Place 1 drop into the right eye 4 (four) times daily. (Patient not taking: No sig reported)   No current facility-administered medications for this visit. (Ophthalmic Drugs)   Current Outpatient Medications (Other)  Medication Sig  . albuterol (VENTOLIN HFA) 108 (90 Base) MCG/ACT inhaler Inhale 2 puffs into the lungs every 6 (six) hours as needed for wheezing or shortness of breath.  . allopurinol (ZYLOPRIM) 100 MG tablet Take 1 tablet (100 mg total) by mouth daily.  Marland Kitchen amitriptyline (ELAVIL) 75 MG tablet Take 75 mg by mouth at bedtime.  Marland Kitchen amLODipine (NORVASC) 10 MG tablet TAKE ONE TABLET BY MOUTH ONCE DAILY  . apixaban (ELIQUIS) 2.5 MG TABS tablet Take 2.5 mg by mouth 2 (two) times daily.  Marland Kitchen apixaban (ELIQUIS) 2.5 MG TABS tablet Take 2.5 mg by mouth in the morning and at bedtime.  Marland Kitchen apixaban (ELIQUIS) 5 MG TABS tablet Take 1 tablet (5 mg total) by mouth 2 (two) times daily. (Patient not taking: No sig  reported)  . Blood Glucose Monitoring Suppl (CONTOUR NEXT MONITOR) w/Device KIT 1 Device by Does not apply route 3 (three) times daily. Use to check blood sugars 3 times daily. Dx Code E13.9  . CONTOUR NEXT TEST test strip USE 1 STRIP TO CHECK GLUCOSE 4 TIMES DAILY  . furosemide (LASIX) 20 MG tablet Take 60 mg in the morning and 40 mg in the evening.  . insulin degludec (TRESIBA FLEXTOUCH) 200 UNIT/ML FlexTouch Pen Inject 24 Units into the skin at bedtime. (Patient taking differently: Inject 16 Units into the skin at bedtime.)  . insulin lispro (HUMALOG) 100 UNIT/ML cartridge Inject 5-15 Units into the skin 3 (three) times daily with meals. Sliding scale.  . Insulin Pen Needle (EASY COMFORT PEN NEEDLES) 33G X 4 MM MISC 1 each by Does not apply route See admin instructions. Use to inject insulin 5 times daily.  Marland Kitchen levothyroxine (SYNTHROID, LEVOTHROID) 75 MCG tablet Take 75 mcg by mouth daily before breakfast.   . lidocaine (XYLOCAINE) 2 % solution Use as directed 15 mLs in the mouth or throat as needed for mouth pain.  Marland Kitchen lidocaine-prilocaine (EMLA) cream Apply 1 application topically as needed. Apply a teaspoon over port site at least 1 hour prior to lab appt. Do not rub in and cover with plastic wrap.  Marland Kitchen  lisinopril (ZESTRIL) 10 MG tablet TAKE ONE TABLET DAILY (Patient taking differently: Take 10 mg by mouth daily.)  . metFORMIN (GLUCOPHAGE-XR) 750 MG 24 hr tablet TAKE 1 TABLET WITH DINNER  . ondansetron (ZOFRAN) 4 MG tablet Take 1 tablet (4 mg total) by mouth every 6 (six) hours as needed for nausea.  . OXYGEN Inhale 2 L into the lungs. At bedtime and during the day prn  . potassium chloride SA (K-DUR,KLOR-CON) 20 MEQ tablet Take 20 mEq by mouth 2 (two) times daily.   . prochlorperazine (COMPAZINE) 10 MG tablet Take 1 tablet (10 mg total) by mouth every 6 (six) hours as needed for nausea or vomiting.  . rosuvastatin (CRESTOR) 20 MG tablet Take 20 mg by mouth daily.  . sotorasib 120 MG TABS TAKE 960  MG BY MOUTH EVERY MORNING.  Marland Kitchen spironolactone (ALDACTONE) 25 MG tablet Take 0.5 tablets (12.5 mg total) by mouth daily. (Patient not taking: Reported on 03/14/2021)  . Tamsulosin HCl (FLOMAX) 0.4 MG CAPS Take 0.4 mg by mouth at bedtime.  . Tiotropium Bromide Monohydrate (SPIRIVA RESPIMAT) 2.5 MCG/ACT AERS Inhale 2 puffs into the lungs daily.  . vitamin B-12 (CYANOCOBALAMIN) 1000 MCG tablet Take 1,000 mcg by mouth at bedtime.   No current facility-administered medications for this visit. (Other)      REVIEW OF SYSTEMS:    ALLERGIES Allergies  Allergen Reactions  . Actos [Pioglitazone] Swelling  . Spironolactone Itching  . Metformin And Related Nausea Only    3.16.2022 pt is currently taking this medication at home  . Niaspan [Niacin Er] Itching and Rash  . Wound Dressing Adhesive Rash    PAST MEDICAL HISTORY Past Medical History:  Diagnosis Date  . Aortic stenosis, mild 11/14/2013  . Aortic valve sclerosis 03/29/2015  . Bilateral leg edema 05/21/2014  . CAD (coronary artery disease)   . Chronic diastolic congestive heart failure ([place])   . Chronic kidney disease (CKD), stage III (moderate) (HCC)   . 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 Diabetic peripheral neuropathy associated with type 1 diabetes mellitus ([place]) 11/14/2013  . Dysphagia   . Dyspnea on exertion 03/21/2015  . Exogenous obesity   . Gout   . Heart attack ([place])   . History of nuclear stress test 07/2011   dipyridamole; fixed inferolateral defect, worse at stress than rest; no reversible ischemia; low risk scan   . Hyperlipidemia   . Hypertension   . Hypothyroidism   . Insulin dependent diabetes mellitus   . Left foot drop   . Left main coronary artery disease 03/28/2015  . lung ca dx'd 08/2020  . Memory loss   . Obesity (BMI 30-39.9) 11/14/2013  . Obstructive sleep apnea 03/21/2015  . OSA on CPAP    uses a cpap  . Peptic ulcer with hemorrhage 03/28/2015  . Peripheral neuropathy   . Pneumonia   .  Rhabdomyolysis   . 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 Stroke ([place])    L patietal with small scattered lacunar infarcts  . Thrombocytopenia ([place]) 03/21/2015  . Venous insufficiency   . Weakness generalized 03/21/2015   Past Surgical History:  Procedure Laterality Date  . BACK SURGERY  2002   lumbosacral. 11 back surgeries total  . BRONCHIAL BIOPSY  08/06/2020   Procedure: BRONCHIAL BIOPSIES;  Surgeon: Collene Gobble, MD;  Location: [facility] ENDOSCOPY;  Service: Pulmonary;;  . BRONCHIAL BRUSHINGS  08/06/2020  Procedure: BRONCHIAL BRUSHINGS;  Surgeon: Collene Gobble, MD;  Location: [facility] ENDOSCOPY;  Service: Pulmonary;;  . BRONCHIAL NEEDLE ASPIRATION BIOPSY  08/06/2020   Procedure: BRONCHIAL NEEDLE ASPIRATION BIOPSIES;  Surgeon: Collene Gobble, MD;  Location: [facility] ENDOSCOPY;  Service: Pulmonary;;  . Carotid Doppler  03/2013   bilat bulb/prox ICAs - mild amount of fibrous plaque with no evidence of diameter reduction  . CARPAL TUNNEL RELEASE Bilateral 08/09/2014   Procedure: BILATERAL CARPAL TUNNEL RELEASE;  Surgeon: Daryll Brod, MD;  Location: [place];  Service: Orthopedics;  Laterality: Bilateral;  ANESTHESIA:  IV REGIONAL BIL FAB  . CHOLECYSTECTOMY    . COLONOSCOPY    . CORONARY ANGIOPLASTY  10/13/1996  . CORONARY ANGIOPLASTY  09/21/1989   emergency PTCA  . CORONARY ANGIOPLASTY  10/13/1996   Multi-Link diagonal & OD stenting (Dr. Marella Chimes)  . CORONARY ANGIOPLASTY  12/03/1997   disease of mid DX-1 ~50% & in mid PLA & PDA (distal lesions) (Dr. Marella Chimes)   . CORONARY ANGIOPLASTY  10/14/1999   progression of disease distal PLA & PDA; progression of disease prox RCA - moderate (Dr. Marella Chimes)   . CORONARY ANGIOPLASTY WITH STENT PLACEMENT  04/04/2004   4.0x28m non-DES (thrombectomy via AngioJet) to RCA for high grade stenosis (Dr. RMarella Chimes  . CORONARY ARTERY BYPASS GRAFT N/A 03/29/2015   Procedure: CORONARY ARTERY BYPASS GRAFTING  TIMES TWO USING LEFT INTERNAL MAMMARY ARTERY AND RIGHT LEG GREATER SAPHENOUS VEIN HARVESTED ENDOSCOPICALLY.;  Surgeon: CRexene Alberts MD;  [place] [facility]  Service: Open Heart Surgery;  Laterality: N/A;  . ESOPHAGOGASTRODUODENOSCOPY N/A 03/27/2015   Procedure: ESOPHAGOGASTRODUODENOSCOPY (EGD);  Surgeon: MClarene Essex MD;  [facility];  Service: Endoscopy;  Laterality: N/A;  possible dilation  . ESOPHAGOGASTRODUODENOSCOPY N/A 10/05/2020   Procedure: ESOPHAGOGASTRODUODENOSCOPY (EGD);  Surgeon: OArta Silence MD;  [facility];  Service: Endoscopy;  Laterality: N/A;  . FINE NEEDLE ASPIRATION  08/06/2020   Procedure: FINE NEEDLE ASPIRATION (FNA) LINEAR;  Surgeon: BCollene Gobble MD;  [facility];  Service: Pulmonary;;  . IR IMAGING GUIDED PORT INSERTION  10/07/2020  . LEFT HEART CATHETERIZATION WITH CORONARY ANGIOGRAM N/A 03/28/2015   Procedure: LEFT HEART CATHETERIZATION WITH CORONARY ANGIOGRAM;  Surgeon: Peter M JMartinique MD;  [facility];  Service: Cardiovascular;  Laterality: N/A;  . SINUS ENDO W/FUSION    . TONSILLECTOMY    . TRANSTHORACIC ECHOCARDIOGRAM  08/08/2013   EF 55-60%, mild conc hypertrophy, grade 1 diastolic dysfunction; AV with mild stenosis; LA & RA mildly dilated  . VIDEO BRONCHOSCOPY WITH ENDOBRONCHIAL NAVIGATION N/A 08/06/2020   Procedure: VIDEO BRONCHOSCOPY WITH ENDOBRONCHIAL NAVIGATION;  Surgeon: BCollene Gobble MD;  [facility];  Service: Pulmonary;  Laterality: N/A;  . VIDEO BRONCHOSCOPY WITH ENDOBRONCHIAL ULTRASOUND N/A 08/06/2020   Procedure: VIDEO BRONCHOSCOPY WITH ENDOBRONCHIAL ULTRASOUND;  Surgeon: BCollene Gobble MD;  [facility];  Service: Pulmonary;  Laterality: N/A;    FAMILY HISTORY Family History  Problem Relation Age of Onset  . Heart disease Mother   . Coronary artery disease Father   . Cancer Maternal Grandmother   . Heart Problems Maternal Grandfather   . Diabetes Son        borderline      SOCIAL HISTORY Social History   Tobacco Use  . Smoking status: Former Smoker    Packs/day: 2.00    Years: 20.00    Pack years: 40.00    Types: Cigarettes    Quit date: 01/25/1974  Years since quitting: 102.2  . Smokeless tobacco: Never Used  Vaping Use  . Vaping Use: Never used  Substance Use Topics  . Alcohol use: No    Alcohol/week: 0.0 standard drinks  . Drug use: No         OPHTHALMIC EXAM:  Base Eye Exam    Visual Acuity (ETDRS)      Right Left   Dist [place] 20/50 +1 20/40 +1   Dist ph  20/50 +2 NI       Tonometry (Tonopen, 10:25 AM)      Right Left   Pressure 13 12       Pupils      Pupils Dark Light Shape React APD   Right PERRL 2 2 Round Minimal None   Left PERRL 2 2 Round Minimal None       Visual Fields (Counting fingers)      Left Right    Full Full       Extraocular Movement      Right Left    Full Full       Neuro/Psych    Oriented x3: Yes   Mood/Affect: Normal       Dilation    Both eyes: 1.0% Mydriacyl, 2.5% Phenylephrine @ 10:25 AM        Slit Lamp and Fundus Exam    External Exam      Right Left   External Normal Normal       Slit Lamp Exam      Right Left   Lids/Lashes Normal Normal   Conjunctiva/Sclera White and quiet White and quiet   Cornea Clear Clear   Anterior Chamber Deep and quiet Deep and quiet   Iris Round and reactive Round and reactive   Lens Centered posterior chamber intraocular lens Centered posterior chamber intraocular lens   Anterior Vitreous Normal Normal       Fundus Exam      Right Left   Posterior Vitreous Clear, avitric Posterior vitreous detachment   Disc Normal Normal   C/D Ratio 0.5 0.5   Macula No topographic distortion Early age related macular degeneration   Vessels Normal, no DR Normal, no DR   Periphery Normal Normal          IMAGING AND PROCEDURES  Imaging and Procedures for 04/02/21  OCT, Retina - OU - Both Eyes       Right Eye Quality was good. Scan locations  included subfoveal. Central Foveal Thickness: 357. Progression has worsened. Findings include abnormal foveal contour, cystoid macular edema.   Left Eye Quality was good. Scan locations included subfoveal. Central Foveal Thickness: 329. Progression has been stable. Findings include epiretinal membrane.   Notes Improving OD, 1 week post vitrectomy membrane peel initially now however with new onset perifoveal CME which could be early MAC-TEL as the patient does have lung and/or cardiovascular disease which required CPAP in the past, and now only nightly oxygen.  OS with minor epiretinal membrane in the temporal aspect of the macula, no foveal distortion                ASSESSMENT/PLAN:  Cystoid macular degeneration of right eye We will treat with topical NSAID, ketorolac 1 drop right eye 4 times daily.  There may be some role of macular hypoxia at night as the patient does use nightly oxygen and was a former CPAP user.  Macular pucker, right eye 6 weeks post vitrectomy membrane peel with initial improvement in macular Findings post  surgery yet now with new onset CME.      ICD-10-CM   1. Cystoid macular degeneration of right eye  H35.351   2. Macular pucker, right eye  H35.371 OCT, Retina - OU - Both Eyes  3. Macular pucker, left eye  H35.372 OCT, Retina - OU - Both Eyes    1.  OD with new onset CME post vitrectomy membrane peel.  Will need topical NSAID to quiet this eye.  Patient instructed not to mash or rub the eye because the risk of chafing of the iris and release of inflammatory material    2.  Patient is on nighttime oxygen and will continue to use it  3.  Ophthalmic Meds Ordered this visit:  No orders of the defined types were placed in this encounter.      Return in about 7 weeks (around 05/21/2021) for dilate, OD, OCT.  There are no Patient Instructions on file for this visit.   Explained the diagnoses, plan, and follow up with the patient and they  expressed understanding.  Patient expressed understanding of the importance of proper follow up care.   Clent Demark Eveline Sauve M.D. Diseases & Surgery of the Retina and Vitreous Retina [facility] Diabetic [place] 04/02/21     Abbreviations: M myopia (nearsighted); A astigmatism; H hyperopia (farsighted); P presbyopia; Mrx spectacle prescription;  CTL contact lenses; OD right eye; OS left eye; OU both eyes  XT exotropia; ET esotropia; PEK punctate epithelial keratitis; PEE punctate epithelial erosions; DES dry eye syndrome; MGD meibomian gland dysfunction; ATs artificial tears; PFAT's preservative free artificial tears; [place] nuclear sclerotic cataract; PSC posterior subcapsular cataract; ERM epi-retinal membrane; PVD posterior vitreous detachment; RD retinal detachment; DM diabetes mellitus; DR diabetic retinopathy; NPDR non-proliferative diabetic retinopathy; PDR proliferative diabetic retinopathy; CSME clinically significant macular edema; DME diabetic macular edema; dbh dot blot hemorrhages; CWS cotton wool spot; POAG primary open angle glaucoma; C/D cup-to-disc ratio; HVF humphrey visual field; GVF goldmann visual field; OCT optical coherence tomography; IOP intraocular pressure; BRVO Branch retinal vein occlusion; CRVO central retinal vein occlusion; CRAO central retinal artery occlusion; BRAO branch retinal artery occlusion; RT retinal tear; SB scleral buckle; PPV pars plana vitrectomy; VH Vitreous hemorrhage; PRP panretinal laser photocoagulation; IVK intravitreal kenalog; VMT vitreomacular traction; MH Macular hole;  NVD neovascularization of the disc; NVE neovascularization elsewhere; AREDS age related eye disease study; ARMD age related macular degeneration; POAG primary open angle glaucoma; EBMD epithelial/anterior basement membrane dystrophy; ACIOL anterior chamber intraocular lens; IOL intraocular lens; PCIOL posterior chamber intraocular lens; Phaco/IOL phacoemulsification with intraocular lens placement;  [place] photorefractive keratectomy; LASIK laser assisted in situ keratomileusis; HTN hypertension; DM diabetes mellitus; COPD chronic obstructive pulmonary disease

## 2021-04-03 DIAGNOSIS — F0280 Dementia in other diseases classified elsewhere without behavioral disturbance: Secondary | ICD-10-CM | POA: Diagnosis not present

## 2021-04-03 DIAGNOSIS — I872 Venous insufficiency (chronic) (peripheral): Secondary | ICD-10-CM | POA: Diagnosis not present

## 2021-04-03 DIAGNOSIS — I5042 Chronic combined systolic (congestive) and diastolic (congestive) heart failure: Secondary | ICD-10-CM | POA: Diagnosis not present

## 2021-04-03 DIAGNOSIS — C3492 Malignant neoplasm of unspecified part of left bronchus or lung: Secondary | ICD-10-CM | POA: Diagnosis not present

## 2021-04-03 DIAGNOSIS — E039 Hypothyroidism, unspecified: Secondary | ICD-10-CM | POA: Diagnosis not present

## 2021-04-03 DIAGNOSIS — I130 Hypertensive heart and chronic kidney disease with heart failure and stage 1 through stage 4 chronic kidney disease, or unspecified chronic kidney disease: Secondary | ICD-10-CM | POA: Diagnosis not present

## 2021-04-03 DIAGNOSIS — I252 Old myocardial infarction: Secondary | ICD-10-CM | POA: Diagnosis not present

## 2021-04-03 DIAGNOSIS — N1831 Chronic kidney disease, stage 3a: Secondary | ICD-10-CM | POA: Diagnosis not present

## 2021-04-03 DIAGNOSIS — I2510 Atherosclerotic heart disease of native coronary artery without angina pectoris: Secondary | ICD-10-CM | POA: Diagnosis not present

## 2021-04-03 DIAGNOSIS — C3491 Malignant neoplasm of unspecified part of right bronchus or lung: Secondary | ICD-10-CM | POA: Diagnosis not present

## 2021-04-03 DIAGNOSIS — E785 Hyperlipidemia, unspecified: Secondary | ICD-10-CM | POA: Diagnosis not present

## 2021-04-03 DIAGNOSIS — I480 Paroxysmal atrial fibrillation: Secondary | ICD-10-CM | POA: Diagnosis not present

## 2021-04-03 DIAGNOSIS — I350 Nonrheumatic aortic (valve) stenosis: Secondary | ICD-10-CM | POA: Diagnosis not present

## 2021-04-03 DIAGNOSIS — E1122 Type 2 diabetes mellitus with diabetic chronic kidney disease: Secondary | ICD-10-CM | POA: Diagnosis not present

## 2021-04-03 DIAGNOSIS — E1142 Type 2 diabetes mellitus with diabetic polyneuropathy: Secondary | ICD-10-CM | POA: Diagnosis not present

## 2021-04-03 DIAGNOSIS — M109 Gout, unspecified: Secondary | ICD-10-CM | POA: Diagnosis not present

## 2021-04-08 ENCOUNTER — Other Ambulatory Visit: Payer: Self-pay | Admitting: Internal Medicine

## 2021-04-08 ENCOUNTER — Other Ambulatory Visit: Payer: Self-pay

## 2021-04-08 ENCOUNTER — Inpatient Hospital Stay: Payer: Medicare Other

## 2021-04-08 ENCOUNTER — Inpatient Hospital Stay (HOSPITAL_BASED_OUTPATIENT_CLINIC_OR_DEPARTMENT_OTHER): Payer: Medicare Other | Admitting: Internal Medicine

## 2021-04-08 ENCOUNTER — Other Ambulatory Visit: Payer: Medicare Other

## 2021-04-08 VITALS — BP 152/47 | HR 74 | Temp 97.4°F | Resp 18 | Ht 67.0 in | Wt 220.8 lb

## 2021-04-08 DIAGNOSIS — I10 Essential (primary) hypertension: Secondary | ICD-10-CM | POA: Diagnosis not present

## 2021-04-08 DIAGNOSIS — C3491 Malignant neoplasm of unspecified part of right bronchus or lung: Secondary | ICD-10-CM

## 2021-04-08 DIAGNOSIS — Z95828 Presence of other vascular implants and grafts: Secondary | ICD-10-CM

## 2021-04-08 DIAGNOSIS — C3412 Malignant neoplasm of upper lobe, left bronchus or lung: Secondary | ICD-10-CM | POA: Diagnosis not present

## 2021-04-08 DIAGNOSIS — C3431 Malignant neoplasm of lower lobe, right bronchus or lung: Secondary | ICD-10-CM | POA: Diagnosis not present

## 2021-04-08 DIAGNOSIS — Z5111 Encounter for antineoplastic chemotherapy: Secondary | ICD-10-CM | POA: Diagnosis not present

## 2021-04-08 DIAGNOSIS — I110 Hypertensive heart disease with heart failure: Secondary | ICD-10-CM | POA: Diagnosis not present

## 2021-04-08 LAB — CBC WITH DIFFERENTIAL (CANCER CENTER ONLY)
Abs Immature Granulocytes: 0.01 10*3/uL (ref 0.00–0.07)
Basophils Absolute: 0 10*3/uL (ref 0.0–0.1)
Basophils Relative: 1 %
Eosinophils Absolute: 0 10*3/uL (ref 0.0–0.5)
Eosinophils Relative: 1 %
HCT: 34.1 % — ABNORMAL LOW (ref 39.0–52.0)
Hemoglobin: 10.4 g/dL — ABNORMAL LOW (ref 13.0–17.0)
Immature Granulocytes: 0 %
Lymphocytes Relative: 19 %
Lymphs Abs: 0.7 10*3/uL (ref 0.7–4.0)
MCH: 28.3 pg (ref 26.0–34.0)
MCHC: 30.5 g/dL (ref 30.0–36.0)
MCV: 92.9 fL (ref 80.0–100.0)
Monocytes Absolute: 0.5 10*3/uL (ref 0.1–1.0)
Monocytes Relative: 14 %
Neutro Abs: 2.4 10*3/uL (ref 1.7–7.7)
Neutrophils Relative %: 65 %
Platelet Count: 106 10*3/uL — ABNORMAL LOW (ref 150–400)
RBC: 3.67 MIL/uL — ABNORMAL LOW (ref 4.22–5.81)
RDW: 16.4 % — ABNORMAL HIGH (ref 11.5–15.5)
WBC Count: 3.7 10*3/uL — ABNORMAL LOW (ref 4.0–10.5)
nRBC: 0 % (ref 0.0–0.2)

## 2021-04-08 LAB — CMP (CANCER CENTER ONLY)
ALT: 11 U/L (ref 0–44)
AST: 18 U/L (ref 15–41)
Albumin: 3.6 g/dL (ref 3.5–5.0)
Alkaline Phosphatase: 131 U/L — ABNORMAL HIGH (ref 38–126)
Anion gap: 8 (ref 5–15)
BUN: 19 mg/dL (ref 8–23)
CO2: 29 mmol/L (ref 22–32)
Calcium: 9.2 mg/dL (ref 8.9–10.3)
Chloride: 105 mmol/L (ref 98–111)
Creatinine: 1.47 mg/dL — ABNORMAL HIGH (ref 0.61–1.24)
GFR, Estimated: 48 mL/min — ABNORMAL LOW (ref >60)
Glucose, Bld: 138 mg/dL — ABNORMAL HIGH (ref 70–99)
Potassium: 4.1 mmol/L (ref 3.5–5.1)
Sodium: 142 mmol/L (ref 135–145)
Total Bilirubin: 0.7 mg/dL (ref 0.3–1.2)
Total Protein: 6.4 g/dL — ABNORMAL LOW (ref 6.5–8.1)

## 2021-04-08 MED ORDER — HEPARIN SOD (PORK) LOCK FLUSH 100 UNIT/ML IV SOLN
500.0000 [IU] | Freq: Once | INTRAVENOUS | Status: AC
  Administered 2021-04-08: 500 [IU]
  Filled 2021-04-08: qty 5

## 2021-04-08 MED ORDER — SODIUM CHLORIDE 0.9% FLUSH
10.0000 mL | Freq: Once | INTRAVENOUS | Status: AC
  Administered 2021-04-08: 10 mL
  Filled 2021-04-08: qty 10

## 2021-04-08 NOTE — Progress Notes (Signed)
[place] [phone]) [phone]   [phone]) [phone]  OFFICE PROGRESS NOTE  [name], MD [address] [place] [address]  DIAGNOSIS: Stage IV (T4, N2, M1 a) non-small cell lung cancer, mucinous adenocarcinoma presented with bilateral large masses in the left upper lobe as well as the right lower lobe in addition to other small nodules in the right upper lobe with hilar and mediastinal lymphadenopathy diagnosed in August 2021.  Biomarker Findings Microsatellite status - MS-Stable Tumor Mutational Burden - 4 Muts/Mb Genomic Findings For a complete list of the genes assayed, please refer to the Appendix. KRAS G12C GNAS R201H 7 Disease relevant genes with no reportable alterations: ALK, BRAF, EGFR, ERBB2, MET, RET, ROS1  PDL1 Expression: 20%.  PRIOR THERAPY: Systemic chemotherapy with carboplatin for AUC of 5, Alimta 500 mg/M2 and Keytruda 200 mg IV every 3 weeks.  First dose 09/09/2020.  Status post 6 cycles.  Starting from cycle #5 he has been on maintenance treatment with Alimta and Keytruda.  CURRENT THERAPY: Lumakras (Sotorasib) 960 mg p.o. daily.  First dose started 01/25/2021.  Status post 3 months of treatment.  INTERVAL HISTORY: Antwian S Martinique 80 y.o. male returns to the clinic today for follow-up visit accompanied by his wife.  The patient is feeling fine today with no concerning complaints except for shortness of breath with exertion.  He is very active these days.  He denied having any current chest pain, cough or hemoptysis.  He denied having any nausea, vomiting, diarrhea or constipation.  He denied having any fever or chills.  He had no headache or visual changes.  He has been tolerating his treatment with Lumakras (Sotorasib) fairly well.  The patient is here today for evaluation and repeat blood work.   MEDICAL HISTORY: Past Medical History:  Diagnosis Date  . Aortic stenosis, mild 11/14/2013  . Aortic valve sclerosis  03/29/2015  . Bilateral leg edema 05/21/2014  . CAD (coronary artery disease)   . Chronic diastolic congestive heart failure ([facility])   . Chronic kidney disease (CKD), stage III (moderate) ([facility])   . Diabetes 1.5, managed as type 1 ([place]) 02/04/2013  . Diabetic peripheral neuropathy associated with type 1 diabetes mellitus ([facility]) 11/14/2013  . Dysphagia   . Dyspnea on exertion 03/21/2015  . Exogenous obesity   . Gout   . Heart attack ([facility])   . History of nuclear stress test 07/2011   dipyridamole; fixed inferolateral defect, worse at stress than rest; no reversible ischemia; low risk scan   . Hyperlipidemia   . Hypertension   . Hypothyroidism   . Insulin dependent diabetes mellitus   . Left foot drop   . Left main coronary artery disease 03/28/2015  . lung ca dx'd 08/2020  . Memory loss   . Obesity (BMI 30-39.9) 11/14/2013  . Obstructive sleep apnea 03/21/2015  . OSA on CPAP    uses a cpap  . Peptic ulcer with hemorrhage 03/28/2015  . Peripheral neuropathy   . Pneumonia   . Rhabdomyolysis   . 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 Stroke ([facility])    L patietal with small scattered lacunar infarcts  . Thrombocytopenia ([facility]) 03/21/2015  . Venous insufficiency   . Weakness generalized 03/21/2015    ALLERGIES:  is allergic to actos [pioglitazone], spironolactone, metformin and related, niaspan [niacin er], and wound dressing adhesive.  MEDICATIONS:  Current Outpatient Medications  Medication Sig Dispense Refill  . albuterol (VENTOLIN HFA) 108 (90 Base) MCG/ACT inhaler Inhale 2 puffs into the lungs every 6 (six) hours as needed for wheezing or shortness of breath. 8 g 6  . allopurinol (ZYLOPRIM) 100 MG tablet Take 1 tablet (100 mg total) by mouth daily. 30 tablet 6  . amitriptyline (ELAVIL) 75 MG tablet Take 75 mg by mouth at bedtime. Take 0.5 tablets daily HS    . amLODipine (NORVASC) 10 MG tablet TAKE ONE TABLET BY MOUTH ONCE DAILY 30 tablet 0  . apixaban  (ELIQUIS) 5 MG TABS tablet Take 1 tablet (5 mg total) by mouth 2 (two) times daily. 60 tablet 11  . Blood Glucose Monitoring Suppl (CONTOUR NEXT MONITOR) w/Device KIT 1 Device by Does not apply route 3 (three) times daily. Use to check blood sugars 3 times daily. Dx Code E13.9 1 kit 2  . CONTOUR NEXT TEST test strip USE 1 STRIP TO CHECK GLUCOSE 4 TIMES DAILY 300 each 1  . furosemide (LASIX) 20 MG tablet Take 60 mg in the morning and 40 mg in the evening. 450 tablet 2  . insulin degludec (TRESIBA FLEXTOUCH) 200 UNIT/ML FlexTouch Pen Inject 24 Units into the skin at bedtime. (Patient taking differently: Inject 16 Units into the skin at bedtime.)    . insulin lispro (HUMALOG) 100 UNIT/ML cartridge Inject 5-15 Units into the skin 3 (three) times daily with meals. Sliding scale.    . Insulin Pen Needle (EASY COMFORT PEN NEEDLES) 33G X 4 MM MISC 1 each by Does not apply route See admin instructions. Use to inject insulin 5 times daily. 150 each 3  . ketorolac (ACULAR) 0.5 % ophthalmic solution Place 1 drop into the right eye 4 (four) times daily. 5 mL 4  . levothyroxine (SYNTHROID, LEVOTHROID) 75 MCG tablet Take 75 mcg by mouth daily before breakfast.   1  . lidocaine (XYLOCAINE) 2 % solution Use as directed 15 mLs in the mouth or throat as needed for mouth pain. 100 mL 0  . lidocaine-prilocaine (EMLA) cream APPLY A TEASPOON OVER PORT SITE AT LEASTONE HOUR BEFORE LAB APPOINTMENT. DO NOT RUB IN AND COVER WITH PLASTIC WRAP 30 g 0  . lisinopril (ZESTRIL) 10 MG tablet TAKE ONE TABLET DAILY (Patient taking differently: Take 10 mg by mouth daily.) 90 tablet 2  . metFORMIN (GLUCOPHAGE-XR) 750 MG 24 hr tablet TAKE 1 TABLET WITH DINNER 90 tablet 1  . ondansetron (ZOFRAN) 4 MG tablet Take 1 tablet (4 mg total) by mouth every 6 (six) hours as needed for nausea. 20 tablet 0  . OXYGEN Inhale 2 L into the lungs. At bedtime and during the day prn    . potassium chloride SA (K-DUR,KLOR-CON) 20 MEQ tablet Take 20 mEq by  mouth 2 (two) times daily.     . prednisoLONE acetate (PRED FORTE) 1 % ophthalmic suspension Place 1 drop into the right eye 4 (four) times daily. 10 mL 0  . prochlorperazine (COMPAZINE) 10 MG tablet Take 1 tablet (10 mg total) by mouth every 6 (six) hours as needed for nausea or vomiting. 30 tablet 0  . rosuvastatin (CRESTOR) 20 MG tablet Take 20 mg by mouth daily.    . sotorasib 120 MG TABS TAKE 960 MG BY MOUTH EVERY MORNING. 240 tablet 3  . Tamsulosin HCl (FLOMAX) 0.4 MG CAPS Take 0.4 mg by mouth at bedtime.    . Tiotropium Bromide Monohydrate (SPIRIVA RESPIMAT) 2.5 MCG/ACT AERS Inhale 2 puffs into the lungs daily.  4 g 3  . vitamin B-12 (CYANOCOBALAMIN) 1000 MCG tablet Take 1,000 mcg by mouth at bedtime.    Marland Kitchen apixaban (ELIQUIS) 2.5 MG TABS tablet Take 2.5 mg by mouth 2 (two) times daily. (Patient not taking: Reported on 04/08/2021)    . apixaban (ELIQUIS) 2.5 MG TABS tablet Take 2.5 mg by mouth in the morning and at bedtime. (Patient not taking: Reported on 04/08/2021)     No current facility-administered medications for this visit.    SURGICAL HISTORY:  Past Surgical History:  Procedure Laterality Date  . BACK SURGERY  2002   lumbosacral. 11 back surgeries total  . BRONCHIAL BIOPSY  08/06/2020   Procedure: BRONCHIAL BIOPSIES;  Surgeon: Collene Gobble, MD;  Location: [facility] ENDOSCOPY;  Service: Pulmonary;;  . BRONCHIAL BRUSHINGS  08/06/2020   Procedure: BRONCHIAL BRUSHINGS;  Surgeon: Collene Gobble, MD;  Location[facility] ENDOSCOPY;  Service: Pulmonary;;  . BRONCHIAL NEEDLE ASPIRATION BIOPSY  08/06/2020   Procedure: BRONCHIAL NEEDLE ASPIRATION BIOPSIES;  Surgeon: Collene Gobble, MD;  Location: [facility] ENDOSCOPY;  Service: Pulmonary;;  . Carotid Doppler  03/2013   bilat bulb/prox ICAs - mild amount of fibrous plaque with no evidence of diameter reduction  . CARPAL TUNNEL RELEASE Bilateral 08/09/2014   Procedure: BILATERAL CARPAL TUNNEL RELEASE;  Surgeon: Daryll Brod, MD;  Location: [facility];  Service: Orthopedics;  Laterality: Bilateral;  ANESTHESIA:  IV REGIONAL BIL FAB  . CHOLECYSTECTOMY    . COLONOSCOPY    . CORONARY ANGIOPLASTY  10/13/1996  . CORONARY ANGIOPLASTY  09/21/1989   emergency PTCA  . CORONARY ANGIOPLASTY  10/13/1996   Multi-Link diagonal & OD stenting (Dr. Marella Chimes)  . CORONARY ANGIOPLASTY  12/03/1997   disease of mid DX-1 ~50% & in mid PLA & PDA (distal lesions) (Dr. Marella Chimes)   . CORONARY ANGIOPLASTY  10/14/1999   progression of disease distal PLA & PDA; progression of disease prox RCA - moderate (Dr. Marella Chimes)   . CORONARY ANGIOPLASTY WITH STENT PLACEMENT  04/04/2004   4.0x27m non-DES (thrombectomy via AngioJet) to RCA for high grade stenosis (Dr. RMarella Chimes  . CORONARY ARTERY BYPASS GRAFT N/A 03/29/2015   Procedure: CORONARY ARTERY BYPASS GRAFTING TIMES TWO USING LEFT INTERNAL MAMMARY ARTERY AND RIGHT LEG GREATER SAPHENOUS VEIN HARVESTED ENDOSCOPICALLY.;  Surgeon: CRexene Alberts MD;  [place] [facility]  Service: Open Heart Surgery;  Laterality: N/A;  . ESOPHAGOGASTRODUODENOSCOPY N/A 03/27/2015   Procedure: ESOPHAGOGASTRODUODENOSCOPY (EGD);  Surgeon: MClarene Essex MD;  [facility];  Service: Endoscopy;  Laterality: N/A;  possible dilation  . ESOPHAGOGASTRODUODENOSCOPY N/A 10/05/2020   Procedure: ESOPHAGOGASTRODUODENOSCOPY (EGD);  Surgeon: OArta Silence MD;  [facility];  Service: Endoscopy;  Laterality: N/A;  . FINE NEEDLE ASPIRATION  08/06/2020   Procedure: FINE NEEDLE ASPIRATION (FNA) LINEAR;  Surgeon: BCollene Gobble MD;  [facility];  Service: Pulmonary;;  . IR IMAGING GUIDED PORT INSERTION  10/07/2020  . LEFT HEART CATHETERIZATION WITH CORONARY ANGIOGRAM N/A 03/28/2015   Procedure: LEFT HEART CATHETERIZATION WITH CORONARY ANGIOGRAM;  Surgeon: Peter M JMartinique MD;  [facility];  Service: Cardiovascular;  Laterality: N/A;  . SINUS ENDO W/FUSION    . TONSILLECTOMY    . TRANSTHORACIC  ECHOCARDIOGRAM  08/08/2013   EF 55-60%, mild conc hypertrophy, grade 1 diastolic dysfunction; AV with mild stenosis; LA & RA mildly dilated  . VIDEO BRONCHOSCOPY WITH ENDOBRONCHIAL NAVIGATION N/A 08/06/2020   Procedure: VIDEO BRONCHOSCOPY WITH ENDOBRONCHIAL NAVIGATION;  Surgeon: BCollene Gobble MD;  Location: [facility] ENDOSCOPY;  Service: Pulmonary;  Laterality: N/A;  . VIDEO BRONCHOSCOPY WITH ENDOBRONCHIAL ULTRASOUND N/A 08/06/2020   Procedure: VIDEO BRONCHOSCOPY WITH ENDOBRONCHIAL ULTRASOUND;  Surgeon: Collene Gobble, MD;  Location: [facility] ENDOSCOPY;  Service: Pulmonary;  Laterality: N/A;    REVIEW OF SYSTEMS:  A comprehensive review of systems was negative except for: Constitutional: positive for fatigue Respiratory: positive for dyspnea on exertion   PHYSICAL EXAMINATION: General appearance: alert, cooperative, fatigued and no distress Head: Normocephalic, without obvious abnormality, atraumatic Neck: no adenopathy, no JVD, supple, symmetrical, trachea midline and thyroid not enlarged, symmetric, no tenderness/mass/nodules Lymph nodes: Cervical, supraclavicular, and axillary nodes normal. Resp: clear to auscultation bilaterally Back: symmetric, no curvature. ROM normal. No CVA tenderness. Cardio: regular rate and rhythm, S1, S2 normal, no murmur, click, rub or gallop GI: soft, non-tender; bowel sounds normal; no masses,  no organomegaly Extremities: extremities normal, atraumatic, no cyanosis or edema  ECOG PERFORMANCE STATUS: 1 - Symptomatic but completely ambulatory  Blood pressure (!) 152/47, pulse 74, temperature (!) 97.4 F (36.3 C), temperature source Tympanic, resp. rate 18, height _0  (1.702 m), weight 220 lb 12.8 oz (100.2 kg), SpO2 (!) 88 %.  LABORATORY DATA: Lab Results  Component Value Date   WBC 3.7 (L) 04/08/2021   HGB 10.4 (L) 04/08/2021   HCT 34.1 (L) 04/08/2021   MCV 92.9 04/08/2021   PLT 106 (L) 04/08/2021      Chemistry      Component Value Date/Time   NA 143  03/18/2021 1217   K 4.0 03/18/2021 1217   CL 103 03/18/2021 1217   CO2 27 03/18/2021 1217   BUN 20 03/18/2021 1217   CREATININE 1.49 (H) 03/18/2021 1217   CREATININE 1.30 (H) 04/30/2020 1044      Component Value Date/Time   CALCIUM 9.3 03/18/2021 1217   ALKPHOS 166 (H) 03/18/2021 1217   AST 23 03/18/2021 1217   ALT 16 03/18/2021 1217   BILITOT 0.7 03/18/2021 1217       RADIOGRAPHIC STUDIES: CT Abdomen Pelvis Wo Contrast  Result Date: 03/19/2021 CLINICAL DATA:  Non-small cell lung cancer restaging, ongoing immunotherapy EXAM: CT CHEST, ABDOMEN AND PELVIS WITHOUT CONTRAST TECHNIQUE: Multidetector CT imaging of the chest, abdomen and pelvis was performed following the standard protocol without IV contrast. Oral enteric contrast was administered. COMPARISON:  01/10/2021 FINDINGS: CT CHEST FINDINGS Cardiovascular: Right chest port catheter. Aortic atherosclerosis. Aortic valve calcifications. Cardiomegaly. Three-vessel coronary artery calcifications and/or stents. No pericardial effusion. Mediastinum/Nodes: No enlarged mediastinal, hilar, or axillary lymph nodes. Thyroid gland, trachea, and esophagus demonstrate no significant findings. Lungs/Pleura: Slightly diminished right pleural effusion. Unchanged trace left pleural effusion. Significant interval decrease in masslike consolidation of the lingula, dominant central component measuring approximately 4.8 x 3.6 cm, previously 7.2 x 4.2 cm when measured similarly (series 6, image 81). Significant interval decrease in masslike consolidation of the dependent right upper lobe, dominant central component measuring approximately 2.9 x 2.3 cm, previously 4.3 x 3.8 cm (series 6, image 71). Interval resolution of previously seen nodularity in the left lung base (series 6, image 128). There is redemonstrated severe fibrotic scarring or congenital cystic malformation of the right lower lobe (series 6, image 108). Musculoskeletal: No suspicious chest wall mass  or suspicious bone lesions identified. Unchanged superficial subcutaneous rounded lesion overlying the left scapula, likely a small cutaneous inclusion cyst (series 2, image 17). CT ABDOMEN PELVIS FINDINGS Hepatobiliary: No focal liver abnormality is seen. Status post cholecystectomy. No biliary dilatation. Pancreas: Unremarkable. No pancreatic ductal  dilatation or surrounding inflammatory changes. Spleen: Normal in size without significant abnormality. Adrenals/Urinary Tract: Adrenal glands are unremarkable. Redemonstrated partially exophytic mass of the lateral midportion of the left kidney, better characterized as suspicious for renal cell carcinoma by prior contrast enhanced CT (series 2, image 79). Bladder is unremarkable. Stomach/Bowel: Stomach is within normal limits. Appendix appears diminutive although normal. No evidence of bowel wall thickening, distention, or inflammatory changes. Vascular/Lymphatic: Aortic atherosclerosis. No enlarged abdominal or pelvic lymph nodes. Reproductive: No mass or other abnormality. Other: No abdominal wall hernia or abnormality. No abdominopelvic ascites. Musculoskeletal: No acute or significant osseous findings. Unchanged partially calcified subcutaneous mass overlying the left gluteal musculature and sacrum, measuring approximately 13.1 x 6.4 cm (series 2, image 116). IMPRESSION: 1. Significant interval decrease in masslike consolidation of the lingula, dependent right upper lobe, and left lung base. Findings are consistent with treatment response. 2. Slightly diminished right pleural effusion. Unchanged trace left pleural effusion. 3. No noncontrast evidence of metastatic disease within the abdomen or pelvis. 4. Redemonstrated partially exophytic mass of the lateral midportion of the left kidney, better characterized as suspicious for renal cell carcinoma by prior contrast enhanced CT. 5. Unchanged partially calcified subcutaneous mass overlying the left gluteal  musculature and sacrum, of uncertain nature, soft tissue metastasis or other primary soft tissue malignancy not excluded. 6. Coronary artery disease. 7. Aortic valve calcifications. Correlate for echocardiographic evidence of aortic valve dysfunction. Aortic Atherosclerosis (ICD10-I70.0). Electronically Signed   By: Eddie Candle M.D.   On: 03/19/2021 11:51   DG Chest 2 View  Result Date: 03/14/2021 CLINICAL DATA:  Dyspnea, arrhythmia, lung cancer EXAM: CHEST - 2 VIEW COMPARISON:  02/26/2021 chest radiograph. FINDINGS: Surgical hardware from ACDF is partially visualized. Intact sternotomy wires. Right internal jugular Port-A-Cath terminates over the cavoatrial junction. Stable cardiomediastinal silhouette with mild cardiomegaly. No pneumothorax. Stable mild blunting of the right costophrenic angle. No left pleural effusion. Stable elevation of the right hemidiaphragm. No overt pulmonary edema. Focal right perihilar density and basilar left upper lobe lung mass appear stable. No new consolidative airspace disease. Mild bibasilar atelectasis. IMPRESSION: 1. Stable mild cardiomegaly without overt pulmonary edema. 2. Stable known focal right perihilar density and basilar left upper lobe lung mass. 3. Stable mild blunting of the right costophrenic angle, compatible with small right pleural effusion versus pleuroparenchymal scarring. Chronic elevation of the right hemidiaphragm. Mild bibasilar atelectasis. Electronically Signed   By: Ilona Sorrel M.D.   On: 03/14/2021 11:38   CT Chest Wo Contrast  Result Date: 03/19/2021 CLINICAL DATA:  Non-small cell lung cancer restaging, ongoing immunotherapy EXAM: CT CHEST, ABDOMEN AND PELVIS WITHOUT CONTRAST TECHNIQUE: Multidetector CT imaging of the chest, abdomen and pelvis was performed following the standard protocol without IV contrast. Oral enteric contrast was administered. COMPARISON:  01/10/2021 FINDINGS: CT CHEST FINDINGS Cardiovascular: Right chest port catheter.  Aortic atherosclerosis. Aortic valve calcifications. Cardiomegaly. Three-vessel coronary artery calcifications and/or stents. No pericardial effusion. Mediastinum/Nodes: No enlarged mediastinal, hilar, or axillary lymph nodes. Thyroid gland, trachea, and esophagus demonstrate no significant findings. Lungs/Pleura: Slightly diminished right pleural effusion. Unchanged trace left pleural effusion. Significant interval decrease in masslike consolidation of the lingula, dominant central component measuring approximately 4.8 x 3.6 cm, previously 7.2 x 4.2 cm when measured similarly (series 6, image 81). Significant interval decrease in masslike consolidation of the dependent right upper lobe, dominant central component measuring approximately 2.9 x 2.3 cm, previously 4.3 x 3.8 cm (series 6, image 71). Interval resolution of previously seen nodularity in the  left lung base (series 6, image 128). There is redemonstrated severe fibrotic scarring or congenital cystic malformation of the right lower lobe (series 6, image 108). Musculoskeletal: No suspicious chest wall mass or suspicious bone lesions identified. Unchanged superficial subcutaneous rounded lesion overlying the left scapula, likely a small cutaneous inclusion cyst (series 2, image 17). CT ABDOMEN PELVIS FINDINGS Hepatobiliary: No focal liver abnormality is seen. Status post cholecystectomy. No biliary dilatation. Pancreas: Unremarkable. No pancreatic ductal dilatation or surrounding inflammatory changes. Spleen: Normal in size without significant abnormality. Adrenals/Urinary Tract: Adrenal glands are unremarkable. Redemonstrated partially exophytic mass of the lateral midportion of the left kidney, better characterized as suspicious for renal cell carcinoma by prior contrast enhanced CT (series 2, image 79). Bladder is unremarkable. Stomach/Bowel: Stomach is within normal limits. Appendix appears diminutive although normal. No evidence of bowel wall thickening,  distention, or inflammatory changes. Vascular/Lymphatic: Aortic atherosclerosis. No enlarged abdominal or pelvic lymph nodes. Reproductive: No mass or other abnormality. Other: No abdominal wall hernia or abnormality. No abdominopelvic ascites. Musculoskeletal: No acute or significant osseous findings. Unchanged partially calcified subcutaneous mass overlying the left gluteal musculature and sacrum, measuring approximately 13.1 x 6.4 cm (series 2, image 116). IMPRESSION: 1. Significant interval decrease in masslike consolidation of the lingula, dependent right upper lobe, and left lung base. Findings are consistent with treatment response. 2. Slightly diminished right pleural effusion. Unchanged trace left pleural effusion. 3. No noncontrast evidence of metastatic disease within the abdomen or pelvis. 4. Redemonstrated partially exophytic mass of the lateral midportion of the left kidney, better characterized as suspicious for renal cell carcinoma by prior contrast enhanced CT. 5. Unchanged partially calcified subcutaneous mass overlying the left gluteal musculature and sacrum, of uncertain nature, soft tissue metastasis or other primary soft tissue malignancy not excluded. 6. Coronary artery disease. 7. Aortic valve calcifications. Correlate for echocardiographic evidence of aortic valve dysfunction. Aortic Atherosclerosis (ICD10-I70.0). Electronically Signed   By: Eddie Candle M.D.   On: 03/19/2021 11:51   OCT, Retina - OU - Both Eyes  Result Date: 04/02/2021 Right Eye Quality was good. Scan locations included subfoveal. Central Foveal Thickness: 357. Progression has worsened. Findings include abnormal foveal contour, cystoid macular edema. Left Eye Quality was good. Scan locations included subfoveal. Central Foveal Thickness: 329. Progression has been stable. Findings include epiretinal membrane. Notes Improving OD, 1 week post vitrectomy membrane peel initially now however with new onset perifoveal CME which  could be early MAC-TEL as the patient does have lung and/or cardiovascular disease which required CPAP in the past, and now only nightly oxygen. OS with minor epiretinal membrane in the temporal aspect of the macula, no foveal distortion   ASSESSMENT AND PLAN: This is a very pleasant 80 years old white male diagnosed with a stage IV (T4, N2, M1 a) non-small cell lung cancer, mucinous adenocarcinoma presented with bilateral large masses in the left upper lobe as well as the right upper lobe in addition to other pulmonary nodules in the right upper lobe with hilar and mediastinal lymphadenopathy diagnosed in August 2021. Molecular study showed positive KRAS G12C mutation and PD-L1 expression of 20%. The patient is currently undergoing systemic chemotherapy with carboplatin for AUC of 5, Alimta 500 mg/M2 and Keytruda 200 mg IV every 3 weeks status post 6 cycles. Starting from cycle #5 the patient is on maintenance treatment with Alimta and Keytruda every 3 weeks.  His treatment was discontinued secondary to disease progression.  The patient is currently on oral treatment with Lumakras (Sotorasib) 960  mg p.o. daily started on 01/25/2021.  Status post 10 weeks of treatment and has been tolerating this treatment fairly well. The patient has been tolerating this treatment well with no concerning adverse effects. I recommended for the patient to continue his treatment with Lumakras (Sotorasib) with the same dose. I will see him back for follow-up visit in 1 months for evaluation and repeat blood work. For the hypertension, he will continue to monitor his blood pressure closely at home and take his medication as prescribed. He was advised to call immediately if he has any other concerning symptoms in the interval.  The patient voices understanding of current disease status and treatment options and is in agreement with the current care plan.  All questions were answered. The patient knows to call the clinic with  any problems, questions or concerns. We can certainly see the patient much sooner if necessary.  Disclaimer: This note was dictated with voice recognition software. Similar sounding words can inadvertently be transcribed and may not be corrected upon review.

## 2021-04-10 ENCOUNTER — Other Ambulatory Visit: Payer: Self-pay

## 2021-04-10 ENCOUNTER — Encounter: Payer: Self-pay | Admitting: Endocrinology

## 2021-04-10 ENCOUNTER — Ambulatory Visit (INDEPENDENT_AMBULATORY_CARE_PROVIDER_SITE_OTHER): Payer: Medicare Other | Admitting: Endocrinology

## 2021-04-10 DIAGNOSIS — Z794 Long term (current) use of insulin: Secondary | ICD-10-CM | POA: Diagnosis not present

## 2021-04-10 DIAGNOSIS — E1165 Type 2 diabetes mellitus with hyperglycemia: Secondary | ICD-10-CM | POA: Diagnosis not present

## 2021-04-10 NOTE — Patient Instructions (Addendum)
Tresiba 12 units  Cereal: 3-4 units Humalog  ?? DEXCOM

## 2021-04-10 NOTE — Addendum Note (Signed)
Addended by: Kaylyn Lim I on: 04/10/2021 01:45 PM   Modules accepted: Orders

## 2021-04-10 NOTE — Progress Notes (Signed)
Patient ID: Randall Wilkins, male   DOB: 1941-08-10, 80 y.o.   MRN: [id number]           Reason for Appointment: Follow-up for Type 2 Diabetes   History of Present Illness:          Date of diagnosis of type 2 diabetes mellitus:   1998       Background history:   Has been on insulin since about 2001 He thinks he was given metformin only for a month and may have stopped because of renal dysfunction Also Actos was tried and not clear what side effects he had Does not remember other medication he may have tried but has not tried Victoza or Byetta   Recent history:    INSULIN regimen is:   [place] 14 units at 8 PM .  Humalog 2 to 7 units before meals  Non-insulin hypoglycemic drugs the patient is taking are:  Metformin 750 mg daily  His A1c is 6.7  Current management, blood sugar patterns and problems identified:   His wife is usually monitoring his sugars and giving him insulin doses appropriately  Insulin requirement has decreased  She is now getting a 14 units Tresiba insulin 16  Again most of his mealtime doses are based on [place] blood sugars  However sometimes in the morning he will have cereal and blood sugars may be higher at lunch  Usually however blood sugars after dinner are fairly good  Recently his freestyle libre sensor has been reading falsely low and she has not been relying on his  This however shows from rising blood sugars midday or early afternoon, usually not after dinner  His appetite has been fair but his oncologist has restricted his diet somewhat  Previously had epidural steroids and he went up on his insulin, highest blood sugar recently is only 198  No recent weight change   Glucose monitoring:  Checking blood sugars 3-4 times a day       Glucometer:  Libre/contour   Data for the last 4 weeks from meter    PRE-MEAL Fasting Lunch Dinner Bedtime Overall  Glucose range:  80-151     69-198  Averages:  115  166  126  132  132     Previous data:   CGM use % of time  60  2-week average/GV  112/31  Time in range     87   %  % Time Above 180 4  % Time above 250   % Time Below 70 9     PRE-MEAL Fasting Lunch Dinner  12-2 AM Overall  Glucose range:       Averages:  90  118   80    POST-MEAL PC Breakfast PC Lunch PC Dinner  Glucose range:     Averages:  95  150 ?  145      Self-care:  Meal times are:  Breakfast is at 8 a.m., lunch: 12 noon Dinner: 5-6 PM   Typical meal intake: Breakfast is sometimes doughnuts or sometimes egg sandwich   lunch is a sandwich or cottage cheese/food, dinner is meat and vegetables/solid    Snacks: Sugar-free Jell-O or pudding, crackers          Dietician visit, most recent: 12/2017               CDE 8/18  Exercise:  unable to do any, using walker for ambulation  Weight history: Range 252-280 previously  Wt Readings from Last 3  Encounters:  04/10/21 224 lb 3.2 oz (101.7 kg)  04/08/21 220 lb 12.8 oz (100.2 kg)  03/20/21 225 lb 9.6 oz (102.3 kg)    Glycemic control:   Lab Results  Component Value Date   HGBA1C 6.7 (H) 02/26/2021   HGBA1C 6.4 01/06/2021   HGBA1C 6.7 (H) 10/06/2020   Lab Results  Component Value Date   MICROALBUR 35.5 (H) 02/12/2020   LDLCALC 36 01/06/2021   CREATININE 1.47 (H) 04/08/2021   Lab Results  Component Value Date   MICRALBCREAT 52.7 (H) 02/12/2020    Lab Results  Component Value Date   FRUCTOSAMINE 252 12/17/2017   FRUCTOSAMINE 233 08/09/2017      Allergies as of 04/10/2021      Reactions   Actos [pioglitazone] Swelling   Spironolactone Itching   Metformin And Related Nausea Only   3.16.2022 pt is currently taking this medication at home   Niaspan [niacin Er] Itching, Rash   Wound Dressing Adhesive Rash      Medication List       Accurate as of April 10, 2021 11:59 PM. If you have any questions, ask your nurse or doctor.        albuterol 108 (90 Base) MCG/ACT inhaler Commonly known as: VENTOLIN HFA Inhale  2 puffs into the lungs every 6 (six) hours as needed for wheezing or shortness of breath.   allopurinol 100 MG tablet Commonly known as: ZYLOPRIM Take 1 tablet (100 mg total) by mouth daily.   amitriptyline 75 MG tablet Commonly known as: ELAVIL Take 75 mg by mouth at bedtime. Take 0.5 tablets daily HS   amLODipine 10 MG tablet Commonly known as: NORVASC TAKE ONE TABLET BY MOUTH ONCE DAILY   apixaban 2.5 MG Tabs tablet Commonly known as: ELIQUIS Take 2.5 mg by mouth 2 (two) times daily.   apixaban 2.5 MG Tabs tablet Commonly known as: ELIQUIS Take 2.5 mg by mouth in the morning and at bedtime.   apixaban 5 MG Tabs tablet Commonly known as: Eliquis Take 1 tablet (5 mg total) by mouth 2 (two) times daily.   Contour Next Monitor w/Device Kit 1 Device by Does not apply route 3 (three) times daily. Use to check blood sugars 3 times daily. Dx Code E13.9   Contour Next Test test strip Generic drug: glucose blood USE 1 STRIP TO CHECK GLUCOSE 4 TIMES DAILY   Easy Comfort Pen Needles 33G X 4 MM Misc Generic drug: Insulin Pen Needle 1 each by Does not apply route See admin instructions. Use to inject insulin 5 times daily.   furosemide 20 MG tablet Commonly known as: LASIX Take 60 mg in the morning and 40 mg in the evening.   HumaLOG 100 UNIT/ML cartridge Generic drug: insulin lispro Inject 5-15 Units into the skin 3 (three) times daily with meals. Sliding scale.   ketorolac 0.5 % ophthalmic solution Commonly known as: ACULAR Place 1 drop into the right eye 4 (four) times daily.   levothyroxine 75 MCG tablet Commonly known as: SYNTHROID Take 75 mcg by mouth daily before breakfast.   lidocaine 2 % solution Commonly known as: XYLOCAINE Use as directed 15 mLs in the mouth or throat as needed for mouth pain.   lidocaine-prilocaine cream Commonly known as: EMLA APPLY A TEASPOON OVER PORT SITE AT LEASTONE HOUR BEFORE LAB APPOINTMENT. DO NOT RUB IN AND COVER WITH PLASTIC  WRAP   lisinopril 10 MG tablet Commonly known as: ZESTRIL TAKE ONE TABLET DAILY   Lum[place]ras 120  MG Tabs Generic drug: sotorasib TAKE 960 MG BY MOUTH EVERY MORNING.   metFORMIN 750 MG 24 hr tablet Commonly known as: GLUCOPHAGE-XR TAKE 1 TABLET WITH DINNER   ondansetron 4 MG tablet Commonly known as: ZOFRAN Take 1 tablet (4 mg total) by mouth every 6 (six) hours as needed for nausea.   OXYGEN Inhale 2 L into the lungs. At bedtime and during the day prn   potassium chloride SA 20 MEQ tablet Commonly known as: KLOR-CON Take 20 mEq by mouth 2 (two) times daily.   prednisoLONE acetate 1 % ophthalmic suspension Commonly known as: PRED FORTE Place 1 drop into the right eye 4 (four) times daily.   prochlorperazine 10 MG tablet Commonly known as: COMPAZINE Take 1 tablet (10 mg total) by mouth every 6 (six) hours as needed for nausea or vomiting.   rosuvastatin 20 MG tablet Commonly known as: CRESTOR Take 20 mg by mouth daily.   Spiriva Respimat 2.5 MCG/ACT Aers Generic drug: Tiotropium Bromide Monohydrate Inhale 2 puffs into the lungs daily.   tamsulosin 0.4 MG Caps capsule Commonly known as: FLOMAX Take 0.4 mg by mouth at bedtime.   Tyler Aas FlexTouch 200 UNIT/ML FlexTouch Pen Generic drug: insulin degludec Inject 24 Units into the skin at bedtime. What changed: how much to take   vitamin B-12 1000 MCG tablet Commonly known as: CYANOCOBALAMIN Take 1,000 mcg by mouth at bedtime.       Allergies:  Allergies  Allergen Reactions  . Actos [Pioglitazone] Swelling  . Spironolactone Itching  . Metformin And Related Nausea Only    3.16.2022 pt is currently taking this medication at home  . Niaspan [Niacin Er] Itching and Rash  . Wound Dressing Adhesive Rash    Past Medical History:  Diagnosis Date  . Aortic stenosis, mild 11/14/2013  . Aortic valve sclerosis 03/29/2015  . Bilateral leg edema 05/21/2014  . CAD (coronary artery disease)   . Chronic diastolic congestive  heart failure ([place])   . Chronic kidney disease (CKD), stage III (moderate) ([facility])   . Diabetes 1.5, managed as type 1 ([place]) 02/04/2013  . Diabetic peripheral neuropathy associated with type 1 diabetes mellitus ([place]) 11/14/2013  . Dysphagia   . Dyspnea on exertion 03/21/2015  . Exogenous obesity   . Gout   . Heart attack ([place])   . History of nuclear stress test 07/2011   dipyridamole; fixed inferolateral defect, worse at stress than rest; no reversible ischemia; low risk scan   . Hyperlipidemia   . Hypertension   . Hypothyroidism   . Insulin dependent diabetes mellitus   . Left foot drop   . Left main coronary artery disease 03/28/2015  . lung ca dx'd 08/2020  . Memory loss   . Obesity (BMI 30-39.9) 11/14/2013  . Obstructive sleep apnea 03/21/2015  . OSA on CPAP    uses a cpap  . Peptic ulcer with hemorrhage 03/28/2015  . Peripheral neuropathy   . Pneumonia   . Rhabdomyolysis   . 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 Stroke ([place])    L patietal with small scattered lacunar infarcts  . Thrombocytopenia ([place]) 03/21/2015  . Venous insufficiency   . Weakness generalized 03/21/2015    Past Surgical History:  Procedure Laterality Date  . BACK SURGERY  2002   lumbosacral. 11 back surgeries total  . BRONCHIAL BIOPSY  08/06/2020   Procedure: BRONCHIAL BIOPSIES;  Surgeon: Collene Gobble, MD;  Location: [facility];  Service: Pulmonary;;  . BRONCHIAL BRUSHINGS  08/06/2020   Procedure: BRONCHIAL BRUSHINGS;  Surgeon: Byrum, Robert S, MD;  Location: [facility];  Service: Pulmonary;;  . BRONCHIAL NEEDLE ASPIRATION BIOPSY  08/06/2020   Procedure: BRONCHIAL NEEDLE ASPIRATION BIOPSIES;  Surgeon: Byrum, Robert S, MD;  Location: [facility];  Service: Pulmonary;;  . Carotid Doppler  03/2013   bilat bulb/prox ICAs - mild amount of fibrous plaque with no evidence of diameter reduction  . CARPAL TUNNEL RELEASE Bilateral 08/09/2014   Procedure: BILATERAL CARPAL TUNNEL  RELEASE;  Surgeon: Gary Kuzma, MD;  Location: [facility];  Service: Orthopedics;  Laterality: Bilateral;  ANESTHESIA:  IV REGIONAL BIL FAB  . CHOLECYSTECTOMY    . COLONOSCOPY    . CORONARY ANGIOPLASTY  10/13/1996  . CORONARY ANGIOPLASTY  09/21/1989   emergency PTCA  . CORONARY ANGIOPLASTY  10/13/1996   Multi-Link diagonal & OD stenting (Dr. R. Weintraub)  . CORONARY ANGIOPLASTY  12/03/1997   disease of mid DX-1 ~50% & in mid PLA & PDA (distal lesions) (Dr. R. Weintraub)   . CORONARY ANGIOPLASTY  10/14/1999   progression of disease distal PLA & PDA; progression of disease prox RCA - moderate (Dr. R. Weintraub)   . CORONARY ANGIOPLASTY WITH STENT PLACEMENT  04/04/2004   4.0x23mm non-DES (thrombectomy via AngioJet) to RCA for high grade stenosis (Dr. R. Weintraub)  . CORONARY ARTERY BYPASS GRAFT N/A 03/29/2015   Procedure: CORONARY ARTERY BYPASS GRAFTING TIMES TWO USING LEFT INTERNAL MAMMARY ARTERY AND RIGHT LEG GREATER SAPHENOUS VEIN HARVESTED ENDOSCOPICALLY.;  Surgeon: Clarence H Owen, MD;  Location: [facility];  Service: Open Heart Surgery;  Laterality: N/A;  . ESOPHAGOGASTRODUODENOSCOPY N/A 03/27/2015   Procedure: ESOPHAGOGASTRODUODENOSCOPY (EGD);  Surgeon: Marc Magod, MD;  Location: [facility];  Service: Endoscopy;  Laterality: N/A;  possible dilation  . ESOPHAGOGASTRODUODENOSCOPY N/A 10/05/2020   Procedure: ESOPHAGOGASTRODUODENOSCOPY (EGD);  Surgeon: Outlaw, William, MD;  Location: [facility];  Service: Endoscopy;  Laterality: N/A;  . FINE NEEDLE ASPIRATION  08/06/2020   Procedure: FINE NEEDLE ASPIRATION (FNA) LINEAR;  Surgeon: Byrum, Robert S, MD;  Location: [facility];  Service: Pulmonary;;  . IR IMAGING GUIDED PORT INSERTION  10/07/2020  . LEFT HEART CATHETERIZATION WITH CORONARY ANGIOGRAM N/A 03/28/2015   Procedure: LEFT HEART CATHETERIZATION WITH CORONARY ANGIOGRAM;  Surgeon: Peter M Townsend, MD;  Location: [facility];  Service: Cardiovascular;  Laterality: N/A;  . SINUS  ENDO W/FUSION    . TONSILLECTOMY    . TRANSTHORACIC ECHOCARDIOGRAM  08/08/2013   EF 55-60%, mild conc hypertrophy, grade 1 diastolic dysfunction; AV with mild stenosis; LA & RA mildly dilated  . VIDEO BRONCHOSCOPY WITH ENDOBRONCHIAL NAVIGATION N/A 08/06/2020   Procedure: VIDEO BRONCHOSCOPY WITH ENDOBRONCHIAL NAVIGATION;  Surgeon: Byrum, Robert S, MD;  Location: [facility];  Service: Pulmonary;  Laterality: N/A;  . VIDEO BRONCHOSCOPY WITH ENDOBRONCHIAL ULTRASOUND N/A 08/06/2020   Procedure: VIDEO BRONCHOSCOPY WITH ENDOBRONCHIAL ULTRASOUND;  Surgeon: Byrum, Robert S, MD;  Location: [facility];  Service: Pulmonary;  Laterality: N/A;    Family History  Problem Relation Age of Onset  . Heart disease Mother   . Coronary artery disease Father   . Cancer Maternal Grandmother   . Heart Problems Maternal Grandfather   . Diabetes Son        borderline     Social History:  reports that he quit smoking about 47 years ago. His smoking use included cigarettes. He has a 40.00 pack-year smoking history. He has never used smokeless tobacco.   He reports that he does not drink alcohol and does not use drugs.   Review of Systems   Lipid history: LDL controlled on 20 mg rosuvastatin, followed by PCP  Has history of CAD    Lab Results  Component Value Date   CHOL 94 01/06/2021   HDL 36.20 (L) 01/06/2021   LDLCALC 36 01/06/2021   TRIG 109.0 01/06/2021   CHOLHDL 3 01/06/2021           Hypertension: Mild and treated with 10 mg lisinopril has been followed by PCP  BP Readings from Last 3 Encounters:  04/10/21 118/68  04/08/21 (!) 152/47  03/20/21 (!) 149/44     CKD: His creatinine is somewhat variable at times  His last microalbumin ratio was 53  Lab Results  Component Value Date   CREATININE 1.47 (H) 04/08/2021   CREATININE 1.49 (H) 03/18/2021   CREATININE 1.42 (H) 03/14/2021    Most recent eye exam was in 2/20, Dr. Shapiro and reportedly no retinopathy   Complications of  diabetes: Peripheral neuropathy with sensory loss.  Dementia: Followed by neurologist and is on Aricept     Physical Examination:  BP 118/68   Pulse (!) 56   Ht 5' 7" (1.702 m)   Wt 224 lb 3.2 oz (101.7 kg)   SpO2 92%   BMI 35.11 kg/m       ASSESSMENT:  Diabetes type 2, insulin-dependent with obesity and history of neuropathy  See history of present illness for detailed discussion of current diabetes management, blood sugar patterns from his meter and freestyle libre download and problems identified  His A1c is 6.7. Currently on basal bolus insulin regimen and low-dose metformin but requiring relatively low doses of insulin now Also continue to be off Victoza  Recently has good control with home average blood sugar 132 However not able to rely on the freestyle libre because of falsely low readings   PLAN:    We will reduce his Tresiba down to 12 units to avoid overnight hypoglycemia Likely needs 3 to 4 units to cover his breakfast if eating cereal in the morning May also need higher dose to cover lunch if eating more carbohydrate Can take the NovoLog right after eating if unsure how much he is going to eat No change in metformin at this time since renal function is still borderline He will check with [facility] to see if he can get Dexcom covered instead of freestyle libre    Patient Instructions  Tresiba 12 units  Cereal: 3-4 units Humalog  ?? DEXCOM            04/11/2021, 8:08 AM   Note: This office note was prepared with Dragon voice recognition system technology. Any transcriptional errors that result from this process are unintentional.  

## 2021-04-11 ENCOUNTER — Other Ambulatory Visit: Payer: Self-pay

## 2021-04-11 ENCOUNTER — Other Ambulatory Visit: Payer: Self-pay | Admitting: Endocrinology

## 2021-04-11 DIAGNOSIS — I10 Essential (primary) hypertension: Secondary | ICD-10-CM | POA: Diagnosis not present

## 2021-04-11 DIAGNOSIS — Z794 Long term (current) use of insulin: Secondary | ICD-10-CM

## 2021-04-11 DIAGNOSIS — E1149 Type 2 diabetes mellitus with other diabetic neurological complication: Secondary | ICD-10-CM | POA: Diagnosis not present

## 2021-04-11 DIAGNOSIS — E1165 Type 2 diabetes mellitus with hyperglycemia: Secondary | ICD-10-CM

## 2021-04-11 DIAGNOSIS — E1129 Type 2 diabetes mellitus with other diabetic kidney complication: Secondary | ICD-10-CM | POA: Diagnosis not present

## 2021-04-11 DIAGNOSIS — N183 Chronic kidney disease, stage 3 unspecified: Secondary | ICD-10-CM | POA: Diagnosis not present

## 2021-04-23 ENCOUNTER — Encounter: Payer: Self-pay | Admitting: Medical

## 2021-04-30 ENCOUNTER — Telehealth: Payer: Self-pay

## 2021-04-30 NOTE — Telephone Encounter (Signed)
Pt's wife Mickel Baas came at 2:35pm on 04/30/2021 to pick up medication for patient

## 2021-04-30 NOTE — Telephone Encounter (Signed)
Called and left a detailed message for pt to pick up Patient Assistance Tresiba (2 boxes) that was delivered.

## 2021-05-01 ENCOUNTER — Other Ambulatory Visit: Payer: Self-pay

## 2021-05-01 ENCOUNTER — Encounter (HOSPITAL_COMMUNITY): Payer: Self-pay | Admitting: Pharmacy Technician

## 2021-05-01 ENCOUNTER — Inpatient Hospital Stay (HOSPITAL_COMMUNITY)
Admission: EM | Admit: 2021-05-01 | Discharge: 2021-05-07 | DRG: 291 | Disposition: A | Discharge status: Home / Self Care | Payer: Medicare Other | Attending: Hospitalist | Admitting: Hospitalist

## 2021-05-01 ENCOUNTER — Emergency Department (HOSPITAL_COMMUNITY): Payer: Medicare Other

## 2021-05-01 DIAGNOSIS — I483 Typical atrial flutter: Secondary | ICD-10-CM | POA: Diagnosis not present

## 2021-05-01 DIAGNOSIS — E1042 Type 1 diabetes mellitus with diabetic polyneuropathy: Secondary | ICD-10-CM | POA: Diagnosis present

## 2021-05-01 DIAGNOSIS — Z794 Long term (current) use of insulin: Secondary | ICD-10-CM | POA: Diagnosis not present

## 2021-05-01 DIAGNOSIS — I252 Old myocardial infarction: Secondary | ICD-10-CM | POA: Diagnosis not present

## 2021-05-01 DIAGNOSIS — Z809 Family history of malignant neoplasm, unspecified: Secondary | ICD-10-CM

## 2021-05-01 DIAGNOSIS — I130 Hypertensive heart and chronic kidney disease with heart failure and stage 1 through stage 4 chronic kidney disease, or unspecified chronic kidney disease: Principal | ICD-10-CM | POA: Diagnosis present

## 2021-05-01 DIAGNOSIS — Z833 Family history of diabetes mellitus: Secondary | ICD-10-CM

## 2021-05-01 DIAGNOSIS — N183 Chronic kidney disease, stage 3 unspecified: Secondary | ICD-10-CM | POA: Diagnosis not present

## 2021-05-01 DIAGNOSIS — Z79899 Other long term (current) drug therapy: Secondary | ICD-10-CM

## 2021-05-01 DIAGNOSIS — E785 Hyperlipidemia, unspecified: Secondary | ICD-10-CM | POA: Diagnosis not present

## 2021-05-01 DIAGNOSIS — Z9119 Patient's noncompliance with other medical treatment and regimen: Secondary | ICD-10-CM

## 2021-05-01 DIAGNOSIS — I509 Heart failure, unspecified: Secondary | ICD-10-CM

## 2021-05-01 DIAGNOSIS — D696 Thrombocytopenia, unspecified: Secondary | ICD-10-CM | POA: Diagnosis present

## 2021-05-01 DIAGNOSIS — N1831 Chronic kidney disease, stage 3a: Secondary | ICD-10-CM | POA: Diagnosis present

## 2021-05-01 DIAGNOSIS — Z951 Presence of aortocoronary bypass graft: Secondary | ICD-10-CM | POA: Diagnosis not present

## 2021-05-01 DIAGNOSIS — C3491 Malignant neoplasm of unspecified part of right bronchus or lung: Secondary | ICD-10-CM | POA: Diagnosis not present

## 2021-05-01 DIAGNOSIS — I441 Atrioventricular block, second degree: Secondary | ICD-10-CM | POA: Diagnosis not present

## 2021-05-01 DIAGNOSIS — I10 Essential (primary) hypertension: Secondary | ICD-10-CM | POA: Diagnosis present

## 2021-05-01 DIAGNOSIS — I872 Venous insufficiency (chronic) (peripheral): Secondary | ICD-10-CM | POA: Diagnosis present

## 2021-05-01 DIAGNOSIS — I4892 Unspecified atrial flutter: Secondary | ICD-10-CM | POA: Diagnosis not present

## 2021-05-01 DIAGNOSIS — J9611 Chronic respiratory failure with hypoxia: Secondary | ICD-10-CM | POA: Diagnosis present

## 2021-05-01 DIAGNOSIS — I5021 Acute systolic (congestive) heart failure: Secondary | ICD-10-CM | POA: Diagnosis not present

## 2021-05-01 DIAGNOSIS — Z6834 Body mass index (BMI) 34.0-34.9, adult: Secondary | ICD-10-CM | POA: Diagnosis not present

## 2021-05-01 DIAGNOSIS — I4819 Other persistent atrial fibrillation: Secondary | ICD-10-CM | POA: Diagnosis present

## 2021-05-01 DIAGNOSIS — G4733 Obstructive sleep apnea (adult) (pediatric): Secondary | ICD-10-CM | POA: Diagnosis present

## 2021-05-01 DIAGNOSIS — Z9861 Coronary angioplasty status: Secondary | ICD-10-CM | POA: Diagnosis not present

## 2021-05-01 DIAGNOSIS — F05 Delirium due to known physiological condition: Secondary | ICD-10-CM | POA: Diagnosis not present

## 2021-05-01 DIAGNOSIS — I272 Pulmonary hypertension, unspecified: Secondary | ICD-10-CM | POA: Diagnosis not present

## 2021-05-01 DIAGNOSIS — M6282 Rhabdomyolysis: Secondary | ICD-10-CM | POA: Diagnosis present

## 2021-05-01 DIAGNOSIS — D61810 Antineoplastic chemotherapy induced pancytopenia: Secondary | ICD-10-CM | POA: Diagnosis not present

## 2021-05-01 DIAGNOSIS — E1021 Type 1 diabetes mellitus with diabetic nephropathy: Secondary | ICD-10-CM | POA: Diagnosis not present

## 2021-05-01 DIAGNOSIS — E669 Obesity, unspecified: Secondary | ICD-10-CM | POA: Diagnosis not present

## 2021-05-01 DIAGNOSIS — Z955 Presence of coronary angioplasty implant and graft: Secondary | ICD-10-CM

## 2021-05-01 DIAGNOSIS — I2510 Atherosclerotic heart disease of native coronary artery without angina pectoris: Secondary | ICD-10-CM | POA: Diagnosis present

## 2021-05-01 DIAGNOSIS — I5031 Acute diastolic (congestive) heart failure: Secondary | ICD-10-CM | POA: Diagnosis not present

## 2021-05-01 DIAGNOSIS — Z20822 Contact with and (suspected) exposure to covid-19: Secondary | ICD-10-CM | POA: Diagnosis present

## 2021-05-01 DIAGNOSIS — R0602 Shortness of breath: Secondary | ICD-10-CM

## 2021-05-01 DIAGNOSIS — E876 Hypokalemia: Secondary | ICD-10-CM | POA: Diagnosis not present

## 2021-05-01 DIAGNOSIS — M109 Gout, unspecified: Secondary | ICD-10-CM | POA: Diagnosis present

## 2021-05-01 DIAGNOSIS — Z7984 Long term (current) use of oral hypoglycemic drugs: Secondary | ICD-10-CM

## 2021-05-01 DIAGNOSIS — J189 Pneumonia, unspecified organism: Secondary | ICD-10-CM | POA: Diagnosis not present

## 2021-05-01 DIAGNOSIS — Z79890 Hormone replacement therapy (postmenopausal): Secondary | ICD-10-CM

## 2021-05-01 DIAGNOSIS — E039 Hypothyroidism, unspecified: Secondary | ICD-10-CM | POA: Diagnosis present

## 2021-05-01 DIAGNOSIS — I350 Nonrheumatic aortic (valve) stenosis: Secondary | ICD-10-CM | POA: Diagnosis not present

## 2021-05-01 DIAGNOSIS — E1022 Type 1 diabetes mellitus with diabetic chronic kidney disease: Secondary | ICD-10-CM | POA: Diagnosis present

## 2021-05-01 DIAGNOSIS — Z9981 Dependence on supplemental oxygen: Secondary | ICD-10-CM

## 2021-05-01 DIAGNOSIS — J90 Pleural effusion, not elsewhere classified: Secondary | ICD-10-CM | POA: Diagnosis not present

## 2021-05-01 DIAGNOSIS — N400 Enlarged prostate without lower urinary tract symptoms: Secondary | ICD-10-CM | POA: Diagnosis present

## 2021-05-01 DIAGNOSIS — I5043 Acute on chronic combined systolic (congestive) and diastolic (congestive) heart failure: Secondary | ICD-10-CM | POA: Diagnosis not present

## 2021-05-01 DIAGNOSIS — Z87891 Personal history of nicotine dependence: Secondary | ICD-10-CM

## 2021-05-01 DIAGNOSIS — Z7901 Long term (current) use of anticoagulants: Secondary | ICD-10-CM

## 2021-05-01 DIAGNOSIS — I5032 Chronic diastolic (congestive) heart failure: Secondary | ICD-10-CM | POA: Diagnosis present

## 2021-05-01 DIAGNOSIS — T451X5A Adverse effect of antineoplastic and immunosuppressive drugs, initial encounter: Secondary | ICD-10-CM | POA: Diagnosis present

## 2021-05-01 LAB — CBC WITH DIFFERENTIAL/PLATELET
Abs Immature Granulocytes: 0.02 10*3/uL (ref 0.00–0.07)
Basophils Absolute: 0.1 10*3/uL (ref 0.0–0.1)
Basophils Relative: 1 %
Eosinophils Absolute: 0 10*3/uL (ref 0.0–0.5)
Eosinophils Relative: 1 %
HCT: 35.9 % — ABNORMAL LOW (ref 39.0–52.0)
Hemoglobin: 11 g/dL — ABNORMAL LOW (ref 13.0–17.0)
Immature Granulocytes: 1 %
Lymphocytes Relative: 21 %
Lymphs Abs: 0.7 10*3/uL (ref 0.7–4.0)
MCH: 29.2 pg (ref 26.0–34.0)
MCHC: 30.6 g/dL (ref 30.0–36.0)
MCV: 95.2 fL (ref 80.0–100.0)
Monocytes Absolute: 0.5 10*3/uL (ref 0.1–1.0)
Monocytes Relative: 14 %
Neutro Abs: 2.2 10*3/uL (ref 1.7–7.7)
Neutrophils Relative %: 62 %
Platelets: 126 10*3/uL — ABNORMAL LOW (ref 150–400)
RBC: 3.77 MIL/uL — ABNORMAL LOW (ref 4.22–5.81)
RDW: 16.3 % — ABNORMAL HIGH (ref 11.5–15.5)
WBC: 3.5 10*3/uL — ABNORMAL LOW (ref 4.0–10.5)
nRBC: 0 % (ref 0.0–0.2)

## 2021-05-01 LAB — RESP PANEL BY RT-PCR (FLU A&B, COVID) ARPGX2
Influenza A by PCR: NEGATIVE
Influenza B by PCR: NEGATIVE
SARS Coronavirus 2 by RT PCR: NEGATIVE

## 2021-05-01 LAB — COMPREHENSIVE METABOLIC PANEL
ALT: 11 U/L (ref 0–44)
AST: 20 U/L (ref 15–41)
Albumin: 3.6 g/dL (ref 3.5–5.0)
Alkaline Phosphatase: 72 U/L (ref 38–126)
Anion gap: 8 (ref 5–15)
BUN: 20 mg/dL (ref 8–23)
CO2: 26 mmol/L (ref 22–32)
Calcium: 9 mg/dL (ref 8.9–10.3)
Chloride: 105 mmol/L (ref 98–111)
Creatinine, Ser: 1.54 mg/dL — ABNORMAL HIGH (ref 0.61–1.24)
GFR, Estimated: 45 mL/min — ABNORMAL LOW (ref >60)
Glucose, Bld: 248 mg/dL — ABNORMAL HIGH (ref 70–99)
Potassium: 4.5 mmol/L (ref 3.5–5.1)
Sodium: 139 mmol/L (ref 135–145)
Total Bilirubin: 1 mg/dL (ref 0.3–1.2)
Total Protein: 6.3 g/dL — ABNORMAL LOW (ref 6.5–8.1)

## 2021-05-01 MED ORDER — SODIUM CHLORIDE 0.9 % IV SOLN
500.0000 mg | Freq: Once | INTRAVENOUS | Status: AC
  Administered 2021-05-01: 500 mg via INTRAVENOUS
  Filled 2021-05-01: qty 500

## 2021-05-01 MED ORDER — SODIUM CHLORIDE 0.9 % IV SOLN
1.0000 g | Freq: Once | INTRAVENOUS | Status: AC
  Administered 2021-05-01: 1 g via INTRAVENOUS
  Filled 2021-05-01: qty 10

## 2021-05-01 MED ORDER — SODIUM CHLORIDE 0.9% FLUSH
10.0000 mL | Freq: Two times a day (BID) | INTRAVENOUS | Status: DC
Start: 2021-05-01 — End: 2021-05-07
  Administered 2021-05-01 – 2021-05-07 (×12): 10 mL

## 2021-05-01 MED ORDER — SODIUM CHLORIDE 0.9% FLUSH: 10.0000 mL | INTRAVENOUS | Status: DC | PRN

## 2021-05-01 MED ORDER — CHLORHEXIDINE GLUCONATE CLOTH 2 % EX PADS
6.0000 | MEDICATED_PAD | Freq: Every day | CUTANEOUS | Status: DC
  Administered 2021-05-03 – 2021-05-07 (×5): 6 via TOPICAL

## 2021-05-01 NOTE — ED Provider Notes (Signed)
Emergency Medicine Provider Triage Evaluation Note  Mike S Martinique , a 80 y.o. male  was evaluated in triage.  Pt complains of shortness of breath.  Patient with stage IV lung cancer, normally on 2 L [place].  Complaining of shortness of breath over the last few days.  History provided by wife at bedside.  Patient went to a funeral several days ago and got very cold.  Has had a dry cough.  Was told by urgent care to come be evaluated.  Not hypoxic here in the ER, no hemoptysis.  He has baseline lower extremity edema and takes Lasix for.  Review of Systems  Positive: As above Negative: As above  Physical Exam  BP (!) 148/62 (BP Location: Left Arm)   Pulse (!) 52   Temp 98.1 F (36.7 C) (Oral)   Resp 17   SpO2 99%  Gen:   Awake, no distress   Resp:  Normal effort  MSK:   Moves extremities without difficulty  Other:  Lung sounds clear, no audible wheezes, no respiratory distress  Medical Decision Making  Medically screening exam initiated at 4:18 PM.  Appropriate orders placed.  Keshun S Martinique was informed that the remainder of the evaluation will be completed by another provider, this initial triage assessment does not replace that evaluation, and the importance of remaining in the ED until their evaluation is complete.     Garald Balding, PA-C 05/01/21 [place], DO 05/01/21 1625

## 2021-05-01 NOTE — ED Provider Notes (Signed)
[facility] EMERGENCY DEPARTMENT Provider Note   CSN: [id number] Arrival date & time: 05/01/21  1543     History Chief Complaint  Patient presents with  . Shortness of Breath    Randall Wilkins is a 80 y.o. male.  HPI patient presents with shortness of breath.  His wife notes that he has had an increased respiratory rate and seems to have more difficulty breathing for the past day.  They note that he was at an outdoor funeral a few days ago and was exposed to the cold.  His cough has been at baseline without change in sputum production.  He has not had any chest pain.  His leg swelling is at baseline without increased or focal pain in either leg.  No fevers at home.  On active chemotherapy for stage IV cancer.  She notes that he takes his oxygen off in his sleep by accident sometimes at night but even when this was reapplied this morning he did not seem to improve.  His family member has been checking his weights frequently at home as she has been instructed and she states that he has not gained more than a pound in the past month or 2.     Past Medical History:  Diagnosis Date  . Aortic stenosis, mild 11/14/2013  . Aortic valve sclerosis 03/29/2015  . Bilateral leg edema 05/21/2014  . CAD (coronary artery disease)   . Chronic diastolic congestive heart failure ([place])   . Chronic kidney disease (CKD), stage III (moderate) (HCC)   . Diabetes 1.5, managed as type 1 ([place]) 02/04/2013  . Diabetic peripheral neuropathy associated with type 1 diabetes mellitus ([place]) 11/14/2013  . Dysphagia   . Dyspnea on exertion 03/21/2015  . Exogenous obesity   . Gout   . Heart attack ([place])   . History of nuclear stress test 07/2011   dipyridamole; fixed inferolateral defect, worse at stress than rest; no reversible ischemia; low risk scan   . Hyperlipidemia   . Hypertension   . Hypothyroidism   . Insulin dependent diabetes mellitus   . Left foot drop   . Left main coronary artery disease  03/28/2015  . lung ca dx'd 08/2020  . Memory loss   . Obesity (BMI 30-39.9) 11/14/2013  . Obstructive sleep apnea 03/21/2015  . OSA on CPAP    uses a cpap  . Peptic ulcer with hemorrhage 03/28/2015  . Peripheral neuropathy   . Pneumonia   . Rhabdomyolysis   . 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 Stroke ([place])    L patietal with small scattered lacunar infarcts  . Thrombocytopenia ([place]) 03/21/2015  . Venous insufficiency   . Weakness generalized 03/21/2015    Patient Active Problem List   Diagnosis Date Noted  . CHF (congestive heart failure) ([place]) 05/02/2021  . CAP (community acquired pneumonia) 05/01/2021  . Cystoid macular degeneration of right eye 04/02/2021  . Symptomatic bradycardia 02/26/2021  . Port-A-Cath in place 02/03/2021  . Macular pucker, right eye 01/29/2021  . Macular pucker, left eye 01/29/2021  . Memory loss 12/23/2020  . Cerebrovascular accident (CVA) ([place]) 12/23/2020  . ARF (acute renal failure) ([place]) 10/02/2020  . Adenocarcinoma of right lung, stage 4 ([place]) 09/02/2020  . Encounter for antineoplastic immunotherapy 09/02/2020  . Goals of care, counseling/discussion 08/20/2020  . Encounter for antineoplastic chemotherapy 08/20/2020  . Mediastinal lymphadenopathy 08/06/2020  . Lung mass 06/05/2020  .  Essential hypertension 03/23/2018  . Mild cognitive impairment 12/24/2017  . Gait abnormality 12/24/2017  . Murmur, cardiac 03/04/2017  . History of stroke 12/12/2015  . Gait disorder 12/12/2015  . Coronary artery disease involving native coronary artery of native heart without angina pectoris   . S/P CABG x 2 03/29/2015  . Aortic valve sclerosis 03/29/2015  . Peptic ulcer with hemorrhage 03/28/2015  . Left main coronary artery disease 03/28/2015  . Chronic diastolic congestive heart failure ([place])   . Chronic kidney disease (CKD), stage III (moderate) (HCC)   . Dysphagia   . AKI (acute kidney injury) ([place])   . Acute  respiratory failure with hypoxia ([place]) 03/25/2015  . NSTEMI (non-ST elevated myocardial infarction) ([place]) 03/25/2015  . Shortness of breath   . UTI (lower urinary tract infection)   . Subendocardial MI subsequent episode care ([place]) 03/24/2015  . SOB (shortness of breath)   . Confusion 03/21/2015  . Weakness generalized 03/21/2015  . Increased urinary frequency 03/21/2015  . Hyperglycemia 03/21/2015  . Thrombocytopenia ([place]) 03/21/2015  . UTI (urinary tract infection) 03/21/2015  . Fall 03/21/2015  . Hematoma of abdominal wall 03/21/2015  . Diarrhea 03/21/2015  . Obstructive sleep apnea 03/21/2015  . Acute renal failure ([place]) 03/21/2015  . Mild diastolic dysfunction 55/73/2202  . Dyspnea on exertion 03/21/2015  . Neuropathy 09/11/2014  . Bilateral leg edema 05/21/2014  . Diabetic peripheral neuropathy associated with type 1 diabetes mellitus ([place]) 11/14/2013  . Type 1 diabetes mellitus with nephropathy ([place]) 11/14/2013  . Aortic valve stenosis 11/14/2013  . Obesity (BMI 30-39.9) 11/14/2013  . Asymptomatic PVCs 11/14/2013  . Memory impairment 02/04/2013  . Hyperlipidemia 02/04/2013  . Diabetes 1.5, managed as type 1 ([place]) 02/04/2013  . Cerebral infarction ([place]) 02/04/2013  . CAD S/P percutaneous coronary angioplasty 11/14/2004    Past Surgical History:  Procedure Laterality Date  . BACK SURGERY  2002   lumbosacral. 11 back surgeries total  . BRONCHIAL BIOPSY  08/06/2020   Procedure: BRONCHIAL BIOPSIES;  Surgeon: Collene Gobble, MD;  Location[facility] END[facility]COPY;  Service: Pulmonary;;  . BRONCHIAL BRUSHINGS  08/06/2020   Procedure: BRONCHIAL BRUSHINGS;  Surgeon: Collene Gobble, MD;  Location[facility]COPY;  Service: Pulmonary;;  . BRONCHIAL NEEDLE ASPIRATION BIOPSY  08/06/2020   Procedure: BRONCHIAL NEEDLE ASPIRATION BIOPSIES;  Surgeon: Collene Gobble, MD;  Location: [facility] ENDOSCOPY;  Service: Pulmonary;;  . Carotid Doppler  03/2013   bilat bulb/prox ICAs - mild amount of fibrous  plaque with no evidence of diameter reduction  . CARPAL TUNNEL RELEASE Bilateral 08/09/2014   Procedure: BILATERAL CARPAL TUNNEL RELEASE;  Surgeon: Daryll Brod, MD;  Location: [facility];  Service: Orthopedics;  Laterality: Bilateral;  ANESTHESIA:  IV REGIONAL BIL FAB  . CHOLECYSTECTOMY    . COLONOSCOPY    . CORONARY ANGIOPLASTY  10/13/1996  . CORONARY ANGIOPLASTY  09/21/1989   emergency PTCA  . CORONARY ANGIOPLASTY  10/13/1996   Multi-Link diagonal & OD stenting (Dr. Marella Chimes)  . CORONARY ANGIOPLASTY  12/03/1997   disease of mid DX-1 ~50% & in mid PLA & PDA (distal lesions) (Dr. Marella Chimes)   . CORONARY ANGIOPLASTY  10/14/1999   progression of disease distal PLA & PDA; progression of disease prox RCA - moderate (Dr. Marella Chimes)   . CORONARY ANGIOPLASTY WITH STENT PLACEMENT  04/04/2004   4.0x89m non-DES (thrombectomy via AngioJet) to RCA for high grade stenosis (Dr. RMarella Chimes  . CORONARY ARTERY BYPASS GRAFT N/A 03/29/2015   Procedure: CORONARY  ARTERY BYPASS GRAFTING TIMES TWO USING LEFT INTERNAL MAMMARY ARTERY AND RIGHT LEG GREATER SAPHENOUS VEIN HARVESTED ENDOSCOPICALLY.;  Surgeon: Rexene Alberts, MD;  Location: [facility];  Service: Open Heart Surgery;  Laterality: N/A;  . ESOPHAGOGASTRODUODENOSCOPY N/A 03/27/2015   Procedure: ESOPHAGOGASTRODUODENOSCOPY (EGD);  Surgeon: Clarene Essex, MD;  Location[facility] ENDOSCOPY;  Service: Endoscopy;  Laterality: N/A;  possible dilation  . ESOPHAGOGASTRODUODENOSCOPY N/A 10/05/2020   Procedure: ESOPHAGOGASTRODUODENOSCOPY (EGD);  Surgeon: Arta Silence, MD;  Location: [facility] ENDOSCOPY;  Service: Endoscopy;  Laterality: N/A;  . FINE NEEDLE ASPIRATION  08/06/2020   Procedure: FINE NEEDLE ASPIRATION (FNA) LINEAR;  Surgeon: Collene Gobble, MD;  Location: [facility] ENDOSCOPY;  Service: Pulmonary;;  . IR IMAGING GUIDED PORT INSERTION  10/07/2020  . LEFT HEART CATHETERIZATION WITH CORONARY ANGIOGRAM N/A 03/28/2015   Procedure: LEFT HEART CATHETERIZATION WITH  CORONARY ANGIOGRAM;  Surgeon: Peter M Martinique, MD;  Location: [facility] CATH LAB;  Service: Cardiovascular;  Laterality: N/A;  . SINUS ENDO W/FUSION    . TONSILLECTOMY    . TRANSTHORACIC ECHOCARDIOGRAM  08/08/2013   EF 55-60%, mild conc hypertrophy, grade 1 diastolic dysfunction; AV with mild stenosis; LA & RA mildly dilated  . VIDEO BRONCHOSCOPY WITH ENDOBRONCHIAL NAVIGATION N/A 08/06/2020   Procedure: VIDEO BRONCHOSCOPY WITH ENDOBRONCHIAL NAVIGATION;  Surgeon: Collene Gobble, MD;  Location: [facility] ENDOSCOPY;  Service: Pulmonary;  Laterality: N/A;  . VIDEO BRONCHOSCOPY WITH ENDOBRONCHIAL ULTRASOUND N/A 08/06/2020   Procedure: VIDEO BRONCHOSCOPY WITH ENDOBRONCHIAL ULTRASOUND;  Surgeon: Collene Gobble, MD;  Location: [facility] ENDOSCOPY;  Service: Pulmonary;  Laterality: N/A;       Family History  Problem Relation Age of Onset  . Heart disease Mother   . Coronary artery disease Father   . Cancer Maternal Grandmother   . Heart Problems Maternal Grandfather   . Diabetes Son        borderline     Social History   Tobacco Use  . Smoking status: Former Smoker    Packs/day: 2.00    Years: 20.00    Pack years: 40.00    Types: Cigarettes    Quit date: 01/25/1974    Years since quitting: 47.2  . Smokeless tobacco: Never Used  Vaping Use  . Vaping Use: Never used  Substance Use Topics  . Alcohol use: No    Alcohol/week: 0.0 standard drinks  . Drug use: No    Home Medications Prior to Admission medications   Medication Sig Start Date End Date Taking? Authorizing Provider  albuterol (VENTOLIN HFA) 108 (90 Base) MCG/ACT inhaler Inhale 2 puffs into the lungs every 6 (six) hours as needed for wheezing or shortness of breath. 09/04/20  Yes Collene Gobble, MD  allopurinol (ZYLOPRIM) 100 MG tablet Take 1 tablet (100 mg total) by mouth daily. 10/18/20  Yes Hilty, Nadean Corwin, MD  amLODipine (NORVASC) 10 MG tablet TAKE ONE TABLET BY MOUTH ONCE DAILY Patient taking differently: Take 10 mg by mouth daily. 03/17/21   Yes Hilty, Nadean Corwin, MD  apixaban (ELIQUIS) 5 MG TABS tablet Take 1 tablet (5 mg total) by mouth 2 (two) times daily. 03/11/21  Yes Hilty, Nadean Corwin, MD  furosemide (LASIX) 20 MG tablet Take 60 mg in the morning and 40 mg in the evening. 03/31/21  Yes Hilty, Nadean Corwin, MD  insulin degludec (TRESIBA FLEXTOUCH) 200 UNIT/ML FlexTouch Pen Inject 24 Units into the skin at bedtime. Patient taking differently: Inject 12 Units into the skin at bedtime. 10/08/20  Yes Sheikh, Omair Latif, DO  insulin lispro (  HUMALOG) 100 UNIT/ML cartridge Inject 5-15 Units into the skin 3 (three) times daily with meals. Sliding scale.   Yes [provider]  ketorolac (ACULAR) 0.5 % ophthalmic solution Place 1 drop into the right eye 4 (four) times daily. 04/02/21 04/02/22 Yes Rankin, Clent Demark, MD  levothyroxine (SYNTHROID, LEVOTHROID) 75 MCG tablet Take 75 mcg by mouth daily before breakfast.  05/14/17  Yes [provider]  lidocaine-prilocaine (EMLA) cream APPLY A TEASPOON OVER PORT SITE AT [place] BEFORE LAB APPOINTMENT. DO NOT RUB IN AND COVER WITH PLASTIC WRAP Patient taking differently: Apply 1 application topically See admin instructions. Apply a teaspoon over port site at least one hour before lab appointment. Do not rub in and cover with plastic wrap. 04/08/21  Yes Curt Bears, MD  lisinopril (ZESTRIL) 10 MG tablet TAKE ONE TABLET DAILY Patient taking differently: Take 10 mg by mouth daily. 08/12/20  Yes Elayne Snare, MD  metFORMIN (GLUCOPHAGE-XR) 750 MG 24 hr tablet TAKE 1 TABLET WITH DINNER Patient taking differently: Take 750 mg by mouth daily with breakfast. Take 1 tablet with dinner 03/17/21  Yes Elayne Snare, MD  ondansetron (ZOFRAN) 4 MG tablet Take 1 tablet (4 mg total) by mouth every 6 (six) hours as needed for nausea. 10/08/20  Yes Sheikh, Omair Latif, DO  potassium chloride SA (K-DUR,KLOR-CON) 20 MEQ tablet Take 20 mEq by mouth 2 (two) times daily.    Yes [provider]   rosuvastatin (CRESTOR) 20 MG tablet Take 20 mg by mouth daily.   Yes [provider]  sotorasib 120 MG TABS TAKE 960 MG BY MOUTH EVERY MORNING. Patient taking differently: Take 960 mg by mouth daily. 01/13/21 01/13/22 Yes Curt Bears, MD  Tamsulosin HCl (FLOMAX) 0.4 MG CAPS Take 0.4 mg by mouth at bedtime.   Yes [provider]  Tiotropium Bromide Monohydrate (SPIRIVA RESPIMAT) 2.5 MCG/ACT AERS Inhale 2 puffs into the lungs daily. 02/06/21  Yes Collene Gobble, MD  vitamin B-12 (CYANOCOBALAMIN) 1000 MCG tablet Take 1,000 mcg by mouth at bedtime.   Yes [provider]  Blood Glucose Monitoring Suppl (CONTOUR NEXT MONITOR) w/Device KIT 1 Device by Does not apply route 3 (three) times daily. Use to check blood sugars 3 times daily. Dx Code E13.9 07/29/17   Elayne Snare, MD  CONTOUR NEXT TEST test strip USE 1 STRIP TO CHECK GLUCOSE 4 TIMES DAILY 10/13/20   Elayne Snare, MD  Insulin Pen Needle (EASY COMFORT PEN NEEDLES) 33G X 4 MM MISC 1 each by Does not apply route See admin instructions. Use to inject insulin 5 times daily. 05/31/19   Elayne Snare, MD  lidocaine (XYLOCAINE) 2 % solution Use as directed 15 mLs in the mouth or throat as needed for mouth pain. Patient not taking: Reported on 05/02/2021 09/19/20   Kinnie Feil, PA-C  OXYGEN Inhale 2 L into the lungs. At bedtime and during the day prn    [provider]  prednisoLONE acetate (PRED FORTE) 1 % ophthalmic suspension Place 1 drop into the right eye 4 (four) times daily. Patient not taking: No sig reported 02/05/21   Rankin, Clent Demark, MD  prochlorperazine (COMPAZINE) 10 MG tablet Take 1 tablet (10 mg total) by mouth every 6 (six) hours as needed for nausea or vomiting. 09/02/20   Curt Bears, MD    Allergies    Actos [pioglitazone], Spironolactone, Metformin and related, Niaspan [niacin er], and Wound dressing adhesive  Review of Systems   Review of Systems  Constitutional:  Negative for chills and  fever.  HENT: Negative for ear pain and sore throat.   Eyes: Negative for pain and visual disturbance.  Respiratory: Positive for shortness of breath. Negative for cough.   Cardiovascular: Negative for chest pain and palpitations.  Gastrointestinal: Negative for abdominal pain and vomiting.  Genitourinary: Negative for dysuria and hematuria.  Musculoskeletal: Negative for arthralgias and back pain.  Skin: Negative for color change and rash.  Neurological: Negative for seizures and syncope.  All other systems reviewed and are negative.   Physical Exam Updated Vital Signs BP (!) 209/64 (BP Location: Right Arm)   Pulse 81   Temp 98 F (36.7 C) (Oral)   Resp 13   SpO2 97%   Physical Exam Vitals and nursing note reviewed.  Constitutional:      Appearance: He is well-developed.  HENT:     Head: Normocephalic and atraumatic.  Eyes:     Extraocular Movements: Extraocular movements intact.     Conjunctiva/sclera: Conjunctivae normal.  Cardiovascular:     Rate and Rhythm: Normal rate and regular rhythm.     Heart sounds: No murmur heard.   Pulmonary:     Effort: Tachypnea present. No respiratory distress.  Abdominal:     Palpations: Abdomen is soft.     Tenderness: There is no abdominal tenderness.  Musculoskeletal:     Cervical back: Normal range of motion and neck supple.     Right lower leg: Edema present.     Left lower leg: Edema present.  Skin:    General: Skin is warm and dry.  Neurological:     General: No focal deficit present.     Mental Status: He is alert.     Motor: No weakness.  Psychiatric:        Behavior: Behavior normal. Behavior is not agitated.     ED Results / Procedures / Treatments   Labs (all labs ordered are listed, but only abnormal results are displayed) Labs Reviewed  CBC WITH DIFFERENTIAL/PLATELET - Abnormal; Notable for the following components:      Result Value   WBC 3.5 (*)    RBC 3.77 (*)    Hemoglobin 11.0 (*)    HCT 35.9 (*)     RDW 16.3 (*)    Platelets 126 (*)    All other components within normal limits  COMPREHENSIVE METABOLIC PANEL - Abnormal; Notable for the following components:   Glucose, Bld 248 (*)    Creatinine, Ser 1.54 (*)    Total Protein 6.3 (*)    GFR, Estimated 45 (*)    All other components within normal limits  HEMOGLOBIN A1C - Abnormal; Notable for the following components:   Hgb A1c MFr Bld 6.0 (*)    All other components within normal limits  BRAIN NATRIURETIC PEPTIDE - Abnormal; Notable for the following components:   B Natriuretic Peptide 692.7 (*)    All other components within normal limits  CBG MONITORING, ED - Abnormal; Notable for the following components:   Glucose-Capillary 106 (*)    All other components within normal limits  CBG MONITORING, ED - Abnormal; Notable for the following components:   Glucose-Capillary 116 (*)    All other components within normal limits  TROPONIN I (HIGH SENSITIVITY) - Abnormal; Notable for the following components:   Troponin I (High Sensitivity) 55 (*)    All other components within normal limits  TROPONIN I (HIGH SENSITIVITY) - Abnormal; Notable for the following components:   Troponin I (High  Sensitivity) 57 (*)    All other components within normal limits  RESP PANEL BY RT-PCR (FLU A&B, COVID) ARPGX2  CULTURE, BLOOD (ROUTINE X 2)  CULTURE, BLOOD (ROUTINE X 2)  MRSA PCR SCREENING  TSH  PROCALCITONIN    EKG EKG Interpretation  Date/Time:  Thursday May 01 2021 16:08:33 EDT Ventricular Rate:  65 PR Interval:    QRS Duration: 82 QT Interval:  326 QTC Calculation: 339 R Axis:   -10 Text Interpretation: Atrial flutter with variable A-V block with premature ventricular or aberrantly conducted complexes Marked ST abnormality, possible anterior subendocardial injury Abnormal ECG When compared to prior, similar a flutter with PVC. No STEMI Confirmed by Antony Blackbird [phone]) on 05/01/2021 8:49:32 PM   Radiology DG Chest 1 View  Result  Date: 05/01/2021 CLINICAL DATA:  Shortness of breath. EXAM: CHEST  1 VIEW COMPARISON:  March 14, 2021 FINDINGS: Stable cardiomediastinal silhouette. Sternotomy wires are noted. No pneumothorax is noted. Right internal jugular Port-A-Cath is unchanged in position. Mild bibasilar atelectasis or infiltrates are noted. Small pleural effusions may be present. Bony thorax is unremarkable. IMPRESSION: Mild bibasilar subsegmental atelectasis or infiltrates. Possible small pleural effusions. Electronically Signed   By: Marijo Conception M.D.   On: 05/01/2021 17:06    Procedures Procedures   Medications Ordered in ED Medications  sodium chloride flush (NS) 0.9 % injection 10-40 mL (10 mLs Intracatheter Given 05/02/21 1105)  sodium chloride flush (NS) 0.9 % injection 10-40 mL (has no administration in time range)  Chlorhexidine Gluconate Cloth 2 % PADS 6 each (6 each Topical Patient Refused/Not Given 05/02/21 1200)  allopurinol (ZYLOPRIM) tablet 100 mg (100 mg Oral Given 05/02/21 1158)  amLODipine (NORVASC) tablet 10 mg (10 mg Oral Given 05/02/21 1105)  lisinopril (ZESTRIL) tablet 10 mg (10 mg Oral Given 05/02/21 1104)  rosuvastatin (CRESTOR) tablet 20 mg (20 mg Oral Given 05/02/21 1105)  amitriptyline (ELAVIL) tablet 75 mg (75 mg Oral Not Given 05/02/21 0042)  insulin glargine (LANTUS) injection 12 Units (12 Units Subcutaneous Given 05/02/21 0046)  levothyroxine (SYNTHROID) tablet 75 mcg (75 mcg Oral Given 05/02/21 0849)  tamsulosin (FLOMAX) capsule 0.4 mg (0.4 mg Oral Given 05/02/21 0045)  apixaban (ELIQUIS) tablet 5 mg (5 mg Oral Given 05/02/21 1105)  vitamin B-12 (CYANOCOBALAMIN) tablet 1,000 mcg (1,000 mcg Oral Given 05/02/21 0044)  potassium chloride SA (KLOR-CON) CR tablet 20 mEq (20 mEq Oral Given 05/02/21 1104)  albuterol (VENTOLIN HFA) 108 (90 Base) MCG/ACT inhaler 2 puff (has no administration in time range)  umeclidinium bromide (INCRUSE ELLIPTA) 62.5 MCG/INH 1 puff (1 puff Inhalation Given 05/02/21 1106)   ketorolac (ACULAR) 0.5 % ophthalmic solution 1 drop (1 drop Right Eye Not Given 05/02/21 1159)  insulin aspart (novoLOG) injection 0-9 Units (0 Units Subcutaneous Not Given 05/02/21 1202)  acetaminophen (TYLENOL) tablet 650 mg (has no administration in time range)    Or  acetaminophen (TYLENOL) suppository 650 mg (has no administration in time range)  furosemide (LASIX) injection 60 mg (60 mg Intravenous Given 05/02/21 0319)  cefTRIAXone (ROCEPHIN) 1 g in sodium chloride 0.9 % 100 mL IVPB (0 g Intravenous Stopped 05/01/21 2337)  azithromycin (ZITHROMAX) 500 mg in sodium chloride 0.9 % 250 mL IVPB (0 mg Intravenous Stopped 05/02/21 0149)    ED Course  I have reviewed the triage vital signs and the nursing notes.  Pertinent labs & imaging results that were available during my care of the patient were reviewed by me and considered in my medical decision making (see  chart for details).    MDM Rules/Calculators/A&P                           Patient presents with shortness of breath.  Notably tachypneic with a persistent respiratory rate between 25 and 30 on my evaluation.  Not respiratory distress, no wheezing, does not appear particularly uncomfortable.  Chest x-ray obtained and reviewed by me; bibasilar opacities which could be atelectasis versus consolidation.  No pulmonary interstitial edema appreciated.  EKG does not have focal ischemic changes, it is atrial flutter with a controlled rate, and is similar to his previous EKGs.  Laboratory studies are overall unremarkable; patient has mild leukopenia which is similar to his baseline and does not have neutropenia.  Fluid overload is possible although the patient is at his baseline weight and baseline edema according to his family member so this is less likely.  Given his clinical appearance with tachypnea, history of cancer, at least partial immunocompromise state, plan is for treatment for pneumonia given the radiographic findings and admission.   Discussed with hospitalist.  Patient and family in agreement with the plan.  Final Clinical Impression(s) / ED Diagnoses Final diagnoses:  SOB (shortness of breath)    Rx / DC Orders ED Discharge Orders    None       Aris Lot, MD 05/02/21 1617    Tegeler, Gwenyth Allegra, MD 05/03/21 1055

## 2021-05-01 NOTE — ED Triage Notes (Signed)
Pt here with reports of increased shob. Pt states he feels  Like he cant catch his breath. Pt with stage 4 lung cancer.

## 2021-05-02 ENCOUNTER — Ambulatory Visit: Payer: Medicare Other | Admitting: Primary Care

## 2021-05-02 ENCOUNTER — Other Ambulatory Visit: Payer: Self-pay

## 2021-05-02 DIAGNOSIS — R0602 Shortness of breath: Secondary | ICD-10-CM | POA: Diagnosis not present

## 2021-05-02 DIAGNOSIS — I5031 Acute diastolic (congestive) heart failure: Secondary | ICD-10-CM | POA: Diagnosis not present

## 2021-05-02 DIAGNOSIS — Z951 Presence of aortocoronary bypass graft: Secondary | ICD-10-CM

## 2021-05-02 DIAGNOSIS — Z794 Long term (current) use of insulin: Secondary | ICD-10-CM | POA: Diagnosis not present

## 2021-05-02 DIAGNOSIS — F05 Delirium due to known physiological condition: Secondary | ICD-10-CM | POA: Diagnosis not present

## 2021-05-02 DIAGNOSIS — I5021 Acute systolic (congestive) heart failure: Secondary | ICD-10-CM

## 2021-05-02 DIAGNOSIS — Z6834 Body mass index (BMI) 34.0-34.9, adult: Secondary | ICD-10-CM | POA: Diagnosis not present

## 2021-05-02 DIAGNOSIS — N1831 Chronic kidney disease, stage 3a: Secondary | ICD-10-CM | POA: Diagnosis present

## 2021-05-02 DIAGNOSIS — Z9861 Coronary angioplasty status: Secondary | ICD-10-CM | POA: Diagnosis not present

## 2021-05-02 DIAGNOSIS — I130 Hypertensive heart and chronic kidney disease with heart failure and stage 1 through stage 4 chronic kidney disease, or unspecified chronic kidney disease: Secondary | ICD-10-CM | POA: Diagnosis not present

## 2021-05-02 DIAGNOSIS — J189 Pneumonia, unspecified organism: Secondary | ICD-10-CM | POA: Diagnosis not present

## 2021-05-02 DIAGNOSIS — I252 Old myocardial infarction: Secondary | ICD-10-CM | POA: Diagnosis not present

## 2021-05-02 DIAGNOSIS — I272 Pulmonary hypertension, unspecified: Secondary | ICD-10-CM | POA: Diagnosis not present

## 2021-05-02 DIAGNOSIS — I483 Typical atrial flutter: Secondary | ICD-10-CM | POA: Diagnosis present

## 2021-05-02 DIAGNOSIS — J9611 Chronic respiratory failure with hypoxia: Secondary | ICD-10-CM | POA: Diagnosis present

## 2021-05-02 DIAGNOSIS — Z20822 Contact with and (suspected) exposure to covid-19: Secondary | ICD-10-CM | POA: Diagnosis not present

## 2021-05-02 DIAGNOSIS — G4733 Obstructive sleep apnea (adult) (pediatric): Secondary | ICD-10-CM | POA: Diagnosis not present

## 2021-05-02 DIAGNOSIS — M6282 Rhabdomyolysis: Secondary | ICD-10-CM | POA: Diagnosis present

## 2021-05-02 DIAGNOSIS — C3491 Malignant neoplasm of unspecified part of right bronchus or lung: Secondary | ICD-10-CM | POA: Diagnosis not present

## 2021-05-02 DIAGNOSIS — I4819 Other persistent atrial fibrillation: Secondary | ICD-10-CM | POA: Diagnosis present

## 2021-05-02 DIAGNOSIS — I2510 Atherosclerotic heart disease of native coronary artery without angina pectoris: Secondary | ICD-10-CM | POA: Diagnosis present

## 2021-05-02 DIAGNOSIS — I5043 Acute on chronic combined systolic (congestive) and diastolic (congestive) heart failure: Secondary | ICD-10-CM

## 2021-05-02 DIAGNOSIS — E1021 Type 1 diabetes mellitus with diabetic nephropathy: Secondary | ICD-10-CM

## 2021-05-02 DIAGNOSIS — E1022 Type 1 diabetes mellitus with diabetic chronic kidney disease: Secondary | ICD-10-CM | POA: Diagnosis not present

## 2021-05-02 DIAGNOSIS — I4892 Unspecified atrial flutter: Secondary | ICD-10-CM | POA: Diagnosis not present

## 2021-05-02 DIAGNOSIS — E669 Obesity, unspecified: Secondary | ICD-10-CM | POA: Diagnosis present

## 2021-05-02 DIAGNOSIS — I509 Heart failure, unspecified: Secondary | ICD-10-CM | POA: Diagnosis present

## 2021-05-02 DIAGNOSIS — E1042 Type 1 diabetes mellitus with diabetic polyneuropathy: Secondary | ICD-10-CM | POA: Diagnosis not present

## 2021-05-02 DIAGNOSIS — E039 Hypothyroidism, unspecified: Secondary | ICD-10-CM | POA: Diagnosis not present

## 2021-05-02 DIAGNOSIS — N183 Chronic kidney disease, stage 3 unspecified: Secondary | ICD-10-CM | POA: Diagnosis not present

## 2021-05-02 DIAGNOSIS — I350 Nonrheumatic aortic (valve) stenosis: Secondary | ICD-10-CM | POA: Diagnosis not present

## 2021-05-02 DIAGNOSIS — D61810 Antineoplastic chemotherapy induced pancytopenia: Secondary | ICD-10-CM | POA: Diagnosis not present

## 2021-05-02 DIAGNOSIS — I441 Atrioventricular block, second degree: Secondary | ICD-10-CM | POA: Diagnosis not present

## 2021-05-02 DIAGNOSIS — E785 Hyperlipidemia, unspecified: Secondary | ICD-10-CM | POA: Diagnosis present

## 2021-05-02 LAB — TROPONIN I (HIGH SENSITIVITY)
Troponin I (High Sensitivity): 55 ng/L — ABNORMAL HIGH (ref <18)
Troponin I (High Sensitivity): 57 ng/L — ABNORMAL HIGH (ref <18)

## 2021-05-02 LAB — PROCALCITONIN: Procalcitonin: <0.1 ng/mL

## 2021-05-02 LAB — CBG MONITORING, ED
Glucose-Capillary: 106 mg/dL — ABNORMAL HIGH (ref 70–99)
Glucose-Capillary: 116 mg/dL — ABNORMAL HIGH (ref 70–99)

## 2021-05-02 LAB — MRSA PCR SCREENING: MRSA by PCR: NEGATIVE

## 2021-05-02 LAB — BRAIN NATRIURETIC PEPTIDE: B Natriuretic Peptide: 692.7 pg/mL — ABNORMAL HIGH (ref 0.0–100.0)

## 2021-05-02 LAB — HEMOGLOBIN A1C
Hgb A1c MFr Bld: 6 % — ABNORMAL HIGH (ref 4.8–5.6)
Mean Plasma Glucose: 125.5 mg/dL

## 2021-05-02 LAB — TSH: TSH: 3.727 u[IU]/mL (ref 0.350–4.500)

## 2021-05-02 LAB — GLUCOSE, CAPILLARY
Glucose-Capillary: 183 mg/dL — ABNORMAL HIGH (ref 70–99)
Glucose-Capillary: 142 mg/dL — ABNORMAL HIGH (ref 70–99)

## 2021-05-02 MED ORDER — FUROSEMIDE 20 MG PO TABS: 40.0000 mg | ORAL_TABLET | Freq: Every day | ORAL | Status: DC

## 2021-05-02 MED ORDER — AMITRIPTYLINE HCL 25 MG PO TABS
75.0000 mg | ORAL_TABLET | Freq: Every day | ORAL | Status: DC
  Administered 2021-05-02 – 2021-05-06 (×4): 75 mg via ORAL
  Filled 2021-05-02 (×7): qty 3

## 2021-05-02 MED ORDER — TAMSULOSIN HCL 0.4 MG PO CAPS
0.4000 mg | ORAL_CAPSULE | Freq: Every day | ORAL | Status: DC
  Administered 2021-05-02 – 2021-05-06 (×5): 0.4 mg via ORAL
  Filled 2021-05-02 (×6): qty 1

## 2021-05-02 MED ORDER — SODIUM CHLORIDE 0.9 % IV SOLN: 500.0000 mg | INTRAVENOUS | Status: DC

## 2021-05-02 MED ORDER — ALBUTEROL SULFATE HFA 108 (90 BASE) MCG/ACT IN AERS: 2.0000 | INHALATION_SPRAY | Freq: Four times a day (QID) | RESPIRATORY_TRACT | Status: DC | PRN

## 2021-05-02 MED ORDER — VITAMIN B-12 1000 MCG PO TABS
1000.0000 ug | ORAL_TABLET | Freq: Every day | ORAL | Status: DC
  Administered 2021-05-02 – 2021-05-06 (×5): 1000 ug via ORAL
  Filled 2021-05-02 (×6): qty 1

## 2021-05-02 MED ORDER — ACETAMINOPHEN 650 MG RE SUPP: 650.0000 mg | Freq: Four times a day (QID) | RECTAL | Status: DC | PRN

## 2021-05-02 MED ORDER — FUROSEMIDE 10 MG/ML IJ SOLN
60.0000 mg | Freq: Two times a day (BID) | INTRAMUSCULAR | Status: DC
  Administered 2021-05-02 – 2021-05-04 (×5): 60 mg via INTRAVENOUS
  Filled 2021-05-02 (×6): qty 6

## 2021-05-02 MED ORDER — ACETAMINOPHEN 325 MG PO TABS: 650.0000 mg | ORAL_TABLET | Freq: Four times a day (QID) | ORAL | Status: DC | PRN

## 2021-05-02 MED ORDER — KETOROLAC TROMETHAMINE 0.5 % OP SOLN
1.0000 [drp] | Freq: Three times a day (TID) | OPHTHALMIC | Status: DC
  Administered 2021-05-02 – 2021-05-07 (×18): 1 [drp] via OPHTHALMIC
  Filled 2021-05-02 (×2): qty 5

## 2021-05-02 MED ORDER — INSULIN GLARGINE 100 UNIT/ML ~~LOC~~ SOLN
12.0000 [IU] | Freq: Every day | SUBCUTANEOUS | Status: DC
  Administered 2021-05-02 – 2021-05-06 (×6): 12 [IU] via SUBCUTANEOUS
  Filled 2021-05-02 (×7): qty 0.12

## 2021-05-02 MED ORDER — LISINOPRIL 10 MG PO TABS
10.0000 mg | ORAL_TABLET | Freq: Every day | ORAL | Status: DC
  Administered 2021-05-02 – 2021-05-07 (×6): 10 mg via ORAL
  Filled 2021-05-02 (×6): qty 1

## 2021-05-02 MED ORDER — ALLOPURINOL 100 MG PO TABS
100.0000 mg | ORAL_TABLET | Freq: Every day | ORAL | Status: DC
  Administered 2021-05-02 – 2021-05-07 (×6): 100 mg via ORAL
  Filled 2021-05-02 (×7): qty 1

## 2021-05-02 MED ORDER — POTASSIUM CHLORIDE CRYS ER 20 MEQ PO TBCR
20.0000 meq | EXTENDED_RELEASE_TABLET | Freq: Two times a day (BID) | ORAL | Status: DC
  Administered 2021-05-02 – 2021-05-04 (×6): 20 meq via ORAL
  Filled 2021-05-02 (×7): qty 1

## 2021-05-02 MED ORDER — ISOSORB DINITRATE-HYDRALAZINE 20-37.5 MG PO TABS
1.0000 | ORAL_TABLET | Freq: Two times a day (BID) | ORAL | Status: DC
  Administered 2021-05-02 – 2021-05-07 (×9): 1 via ORAL
  Filled 2021-05-02 (×10): qty 1

## 2021-05-02 MED ORDER — INSULIN ASPART 100 UNIT/ML IJ SOLN
0.0000 [IU] | Freq: Three times a day (TID) | INTRAMUSCULAR | Status: DC
  Administered 2021-05-02 – 2021-05-05 (×6): 2 [IU] via SUBCUTANEOUS
  Administered 2021-05-05: 1 [IU] via SUBCUTANEOUS
  Administered 2021-05-05: 3 [IU] via SUBCUTANEOUS
  Administered 2021-05-06: 1 [IU] via SUBCUTANEOUS
  Administered 2021-05-07: 5 [IU] via SUBCUTANEOUS
  Administered 2021-05-07: 1 [IU] via SUBCUTANEOUS

## 2021-05-02 MED ORDER — AMLODIPINE BESYLATE 10 MG PO TABS
10.0000 mg | ORAL_TABLET | Freq: Every day | ORAL | Status: DC
  Administered 2021-05-02 – 2021-05-07 (×6): 10 mg via ORAL
  Filled 2021-05-02 (×3): qty 1
  Filled 2021-05-02: qty 2
  Filled 2021-05-02 (×2): qty 1

## 2021-05-02 MED ORDER — UMECLIDINIUM BROMIDE 62.5 MCG/INH IN AEPB
1.0000 | INHALATION_SPRAY | Freq: Every day | RESPIRATORY_TRACT | Status: DC
  Administered 2021-05-02 – 2021-05-07 (×4): 1 via RESPIRATORY_TRACT
  Filled 2021-05-02 (×2): qty 7

## 2021-05-02 MED ORDER — FUROSEMIDE 20 MG PO TABS: 60.0000 mg | ORAL_TABLET | Freq: Every day | ORAL | Status: DC

## 2021-05-02 MED ORDER — ROSUVASTATIN CALCIUM 20 MG PO TABS
20.0000 mg | ORAL_TABLET | Freq: Every day | ORAL | Status: DC
  Administered 2021-05-02 – 2021-05-07 (×6): 20 mg via ORAL
  Filled 2021-05-02 (×6): qty 1

## 2021-05-02 MED ORDER — SODIUM CHLORIDE 0.9 % IV SOLN
2.0000 g | INTRAVENOUS | Status: DC
  Administered 2021-05-02: 2 g via INTRAVENOUS
  Filled 2021-05-02: qty 20

## 2021-05-02 MED ORDER — APIXABAN 5 MG PO TABS
5.0000 mg | ORAL_TABLET | Freq: Two times a day (BID) | ORAL | Status: DC
  Administered 2021-05-02 – 2021-05-07 (×11): 5 mg via ORAL
  Filled 2021-05-02 (×12): qty 1

## 2021-05-02 MED ORDER — LEVOTHYROXINE SODIUM 75 MCG PO TABS
75.0000 ug | ORAL_TABLET | Freq: Every day | ORAL | Status: DC
  Administered 2021-05-02 – 2021-05-07 (×6): 75 ug via ORAL
  Filled 2021-05-02 (×6): qty 1

## 2021-05-02 NOTE — ED Notes (Signed)
I placed pt on 4L 02 per [place]

## 2021-05-02 NOTE — ED Notes (Signed)
Patient sitting up in bed, talking to spouse at bedside, A/A/O, no distress.

## 2021-05-02 NOTE — Progress Notes (Addendum)
80 year old male with history of paroxysmal atrial fibrillation history of CABG hypertension type 2 diabetes non-small cell lung CA stage IV, CKD stage III chronic systolic heart failure did with progressively worsening shortness of breath over the last 48 hours. His wife is very concerned with as he seems to have recurring heart failure.  He had an ER visit last month for bradycardia. Work-up included a Pro-Cal of less than 0.10.  A1c of 6.0.  TSH 3.7.  BNP of 692.  Troponin 55 and 57 mildly elevated Chest x-ray with mild bibasilar subsegmental atelectasis or infiltrates and possible small pleural effusions. He is on O2 at 2 L 24/7 at home.  Currently he is on 4 L of O2.  His saturation is 95%.  Last echo March 2022 ejection fraction 40 to 45%. I have reviewed his chart his medications and his labs.  I have consulted cardiology.  Discussed with patient and wife I also have stopped Rocephin and azithromycin with a low Pro-Cal.

## 2021-05-02 NOTE — Progress Notes (Signed)
Heart Failure Navigator Progress Note  Assessed for Heart & Vascular TOC clinic readiness.  Unfortunately at this time the patient does not meet criteria due to non-small cell lung CA stage IV.   Navigator available for reassessment of patient.   Pricilla Holm, RN, BSN Heart Failure Nurse Navigator [phone]

## 2021-05-02 NOTE — Progress Notes (Signed)
   05/02/21 1517  Assess: MEWS Score  Temp 98 F (36.7 C)  BP (!) 209/64  Pulse Rate 81  ECG Heart Rate 73  Resp 13  Level of Consciousness Alert  SpO2 97 %  O2 Device Nasal Cannula  Patient Activity (if Appropriate) In bed  O2 Flow Rate (L/min) 2 L/min  Assess: MEWS Score  MEWS Temp 0  MEWS Systolic 2  MEWS Pulse 0  MEWS RR 1  MEWS LOC 0  MEWS Score 3  MEWS Score Color Yellow  Assess: if the MEWS score is Yellow or Red  Were vital signs taken at a resting state? Yes  Focused Assessment No change from prior assessment  Early Detection of Sepsis Score *See Row Information* Low  MEWS guidelines implemented *See Row Information* Yes  Treat  MEWS Interventions Escalated (See documentation below)  Pain Scale 0-10  Pain Score 0  Take Vital Signs  Increase Vital Sign Frequency  Yellow: Q 2hr X 2 then Q 4hr X 2, if remains yellow, continue Q 4hrs  Escalate  MEWS: Escalate Yellow: discuss with charge nurse/RN and consider discussing with provider and RRT  Notify: Charge Nurse/RN  Name of Charge Nurse/RN Notified Marita Kansas RN  Date Charge Nurse/RN Notified 05/02/21  Time Charge Nurse/RN Notified 2023  Notify: Provider  Provider Name/Title Eulas Post PA  Date Provider Notified 05/02/21  Time Provider Notified 1525  Notification Type Face-to-face  Notification Reason Other (Comment)  Provider response At bedside  Date of Provider Response 05/02/21  Time of Provider Response 1525  Document  Patient Outcome Stabilized after interventions  Progress note created (see row info) Yes

## 2021-05-02 NOTE — Consult Note (Addendum)
Cardiology Consultation:   Patient ID: Randall Wilkins MRN: [id number]; DOB: 06-09-1941  Admit date: 05/01/2021 Date of Consult: 05/02/2021  PCP:  [name], MD   [facility] Providers Cardiologist:  Pixie Casino, MD        Patient Profile:   Randall Wilkins is a 80 y.o. male with a hx of chronic respiratory failure on home O2, hypertension, hyperlipidemia, IDDM, venous insufficiency, OSA not on CPAP therapy, CAD s/p CABG 2016, persistent atrial fibrillation, history of Mobitz 1 heart block, and stage IV non-small cell lung cancer who is being seen 05/02/2021 for the evaluation of dyspnea at the request of Dr. Hal Hope.  History of Present Illness:   [name] is a hard of hearing 80 year old male with past medical history of chronic respiratory failure on home O2, hypertension, hyperlipidemia, IDDM, venous insufficiency, OSA not on CPAP therapy, CAD s/p CABG 2016, persistent atrial fibrillation, history of Mobitz 1 heart block, and stage IV non-small cell lung cancer.  Patient had remote stenting of diagonal you 97.  He had bare-metal stent to RCA in 2005.  Last cardiac catheterization performed on 03/28/2015 revealed 95% distal left main, 95% ostial LAD, 99% ramus intermediate, 100% ostial left circumflex occlusion with faint left to left collaterals, 60% mid PDA, 90 to 95% in PLOM 3.  He was taken urgently to the operating room and underwent CABG x4 with LIMA to D1 and SVG to OM.  Unfortunately, patient was diagnosed with stage IV non-small cell carcinoma in 2021, this was confirmed with video bronchoscopy and biopsy in August.  Patient started on chemotherapy and has been using 2 L oxygen essentially 24/7.  His wife mentions patient has severe shortness of breath just walking from the bed to the door.  In the past few months, EKG shows patient has been going in and out of atrial fibrillation.  He has occasional sinus rhythm with first-degree AV block and Mobitz 1 heart block as well.   Last echocardiogram obtained on 02/27/2021 showed EF 40 to 45%, RVSP 78 mmHg, trivial MR, trivial AI and mild aortic stenosis.  Compared to the previous echocardiogram in 2021, aortic stenosis appears to have improved.  Patient has been having increasing dyspnea for the past 2 days.  He woke up around 5 AM on Thursday morning telling his wife that he could not breathe laying down.  His wife sent him up and he increased oxygen level.  Due to progressively worsening dyspnea, he sought medical attention at [facility].  Serial troponin was borderline at 55--57.  BNP elevated to 692.7.  Blood culture was negative.  Chest x-ray showed mild bibasilar subsegmental atelectasis or infiltrate, possible small pleural effusions.  There was initial concern for possible pneumonia, patient was placed on antibiotic.  However procalcitonin came back negative, antibiotic was removed and the patient was treated with IV Lasix.    Past Medical History:  Diagnosis Date  . Aortic stenosis, mild 11/14/2013  . Aortic valve sclerosis 03/29/2015  . Bilateral leg edema 05/21/2014  . CAD (coronary artery disease)   . Chronic diastolic congestive heart failure ([place])   . Chronic kidney disease (CKD), stage III (moderate) (HCC)   . Diabetes 1.5, managed as type 1 ([place]) 02/04/2013  . Diabetic peripheral neuropathy associated with type 1 diabetes mellitus ([place]) 11/14/2013  . Dysphagia   . Dyspnea on exertion 03/21/2015  . Exogenous obesity   . Gout   . Heart attack ([place])   . History of nuclear stress  test 07/2011   dipyridamole; fixed inferolateral defect, worse at stress than rest; no reversible ischemia; low risk scan   . Hyperlipidemia   . Hypertension   . Hypothyroidism   . Insulin dependent diabetes mellitus   . Left foot drop   . Left main coronary artery disease 03/28/2015  . lung ca dx'd 08/2020  . Memory loss   . Obesity (BMI 30-39.9) 11/14/2013  . Obstructive sleep apnea 03/21/2015  . OSA on CPAP    uses a cpap   . Peptic ulcer with hemorrhage 03/28/2015  . Peripheral neuropathy   . Pneumonia   . Rhabdomyolysis   . 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 Stroke ([place])    L patietal with small scattered lacunar infarcts  . Thrombocytopenia ([place]) 03/21/2015  . Venous insufficiency   . Weakness generalized 03/21/2015    Past Surgical History:  Procedure Laterality Date  . BACK SURGERY  2002   lumbosacral. 11 back surgeries total  . BRONCHIAL BIOPSY  08/06/2020   Procedure: BRONCHIAL BIOPSIES;  Surgeon: Collene Gobble, MD;  Location: [facility] ENDOSCOPY;  Service: Pulmonary;;  . BRONCHIAL BRUSHINGS  08/06/2020   Procedure: BRONCHIAL BRUSHINGS;  Surgeon: Collene Gobble, MD;  Location: [facility] ENDOSCOPY;  Service: Pulmonary;;  . BRONCHIAL NEEDLE ASPIRATION BIOPSY  08/06/2020   Procedure: BRONCHIAL NEEDLE ASPIRATION BIOPSIES;  Surgeon: Collene Gobble, MD;  Location: [facility] ENDOSCOPY;  Service: Pulmonary;;  . Carotid Doppler  03/2013   bilat bulb/prox ICAs - mild amount of fibrous plaque with no evidence of diameter reduction  . CARPAL TUNNEL RELEASE Bilateral 08/09/2014   Procedure: BILATERAL CARPAL TUNNEL RELEASE;  Surgeon: Daryll Brod, MD;  Location: [place];  Service: Orthopedics;  Laterality: Bilateral;  ANESTHESIA:  IV REGIONAL BIL FAB  . CHOLECYSTECTOMY    . COLONOSCOPY    . CORONARY ANGIOPLASTY  10/13/1996  . CORONARY ANGIOPLASTY  09/21/1989   emergency PTCA  . CORONARY ANGIOPLASTY  10/13/1996   Multi-Link diagonal & OD stenting (Dr. Marella Chimes)  . CORONARY ANGIOPLASTY  12/03/1997   disease of mid DX-1 ~50% & in mid PLA & PDA (distal lesions) (Dr. Marella Chimes)   . CORONARY ANGIOPLASTY  10/14/1999   progression of disease distal PLA & PDA; progression of disease prox RCA - moderate (Dr. Marella Chimes)   . CORONARY ANGIOPLASTY WITH STENT PLACEMENT  04/04/2004   4.0x38m non-DES (thrombectomy via AngioJet) to RCA for high grade stenosis (Dr. RMarella Chimes   . CORONARY ARTERY BYPASS GRAFT N/A 03/29/2015   Procedure: CORONARY ARTERY BYPASS GRAFTING TIMES TWO USING LEFT INTERNAL MAMMARY ARTERY AND RIGHT LEG GREATER SAPHENOUS VEIN HARVESTED ENDOSCOPICALLY.;  Surgeon: CRexene Alberts MD;  [place]  Service: Open Heart Surgery;  Laterality: N/A;  . ESOPHAGOGASTRODUODENOSCOPY N/A 03/27/2015   Procedure: ESOPHAGOGASTRODUODENOSCOPY (EGD);  Surgeon: MClarene Essex MD;  [facility];  Service: Endoscopy;  Laterality: N/A;  possible dilation  . ESOPHAGOGASTRODUODENOSCOPY N/A 10/05/2020   Procedure: ESOPHAGOGASTRODUODENOSCOPY (EGD);  Surgeon: OArta Silence MD;  [facility];  Service: Endoscopy;  Laterality: N/A;  . FINE NEEDLE ASPIRATION  08/06/2020   Procedure: FINE NEEDLE ASPIRATION (FNA) LINEAR;  Surgeon: BCollene Gobble MD;  [facility];  Service: Pulmonary;;  . IR IMAGING GUIDED PORT INSERTION  10/07/2020  . LEFT HEART CATHETERIZATION WITH CORONARY ANGIOGRAM N/A 03/28/2015   Procedure: LEFT HEART CATHETERIZATION WITH CORONARY ANGIOGRAM;  Surgeon: Peter M JMartinique MD;  Location:  [place] CATH LAB;  Service: Cardiovascular;  Laterality: N/A;  . SINUS ENDO W/FUSION    . TONSILLECTOMY    . TRANSTHORACIC ECHOCARDIOGRAM  08/08/2013   EF 55-60%, mild conc hypertrophy, grade 1 diastolic dysfunction; AV with mild stenosis; LA & RA mildly dilated  . VIDEO BRONCHOSCOPY WITH ENDOBRONCHIAL NAVIGATION N/A 08/06/2020   Procedure: VIDEO BRONCHOSCOPY WITH ENDOBRONCHIAL NAVIGATION;  Surgeon: Collene Gobble, MD;  Location: [place] ENDOSCOPY;  Service: Pulmonary;  Laterality: N/A;  . VIDEO BRONCHOSCOPY WITH ENDOBRONCHIAL ULTRASOUND N/A 08/06/2020   Procedure: VIDEO BRONCHOSCOPY WITH ENDOBRONCHIAL ULTRASOUND;  Surgeon: Collene Gobble, MD;  Location: [facility] ENDOSCOPY;  Service: Pulmonary;  Laterality: N/A;     Home Medications:  Prior to Admission medications   Medication Sig Start Date End Date Taking? Authorizing Provider  albuterol (VENTOLIN HFA)  108 (90 Base) MCG/ACT inhaler Inhale 2 puffs into the lungs every 6 (six) hours as needed for wheezing or shortness of breath. 09/04/20  Yes Collene Gobble, MD  allopurinol (ZYLOPRIM) 100 MG tablet Take 1 tablet (100 mg total) by mouth daily. 10/18/20  Yes Hilty, Nadean Corwin, MD  amLODipine (NORVASC) 10 MG tablet TAKE ONE TABLET BY MOUTH ONCE DAILY Patient taking differently: Take 10 mg by mouth daily. 03/17/21  Yes Hilty, Nadean Corwin, MD  apixaban (ELIQUIS) 5 MG TABS tablet Take 1 tablet (5 mg total) by mouth 2 (two) times daily. 03/11/21  Yes Hilty, Nadean Corwin, MD  furosemide (LASIX) 20 MG tablet Take 60 mg in the morning and 40 mg in the evening. 03/31/21  Yes Hilty, Nadean Corwin, MD  insulin degludec (TRESIBA FLEXTOUCH) 200 UNIT/ML FlexTouch Pen Inject 24 Units into the skin at bedtime. Patient taking differently: Inject 12 Units into the skin at bedtime. 10/08/20  Yes Sheikh, Omair Latif, DO  insulin lispro (HUMALOG) 100 UNIT/ML cartridge Inject 5-15 Units into the skin 3 (three) times daily with meals. Sliding scale.   Yes [provider]  ketorolac (ACULAR) 0.5 % ophthalmic solution Place 1 drop into the right eye 4 (four) times daily. 04/02/21 04/02/22 Yes Rankin, Clent Demark, MD  levothyroxine (SYNTHROID, LEVOTHROID) 75 MCG tablet Take 75 mcg by mouth daily before breakfast.  05/14/17  Yes [provider]  lidocaine-prilocaine (EMLA) cream APPLY A TEASPOON OVER PORT SITE AT [place] BEFORE LAB APPOINTMENT. DO NOT RUB IN AND COVER WITH PLASTIC WRAP Patient taking differently: Apply 1 application topically See admin instructions. Apply a teaspoon over port site at least one hour before lab appointment. Do not rub in and cover with plastic wrap. 04/08/21  Yes Curt Bears, MD  lisinopril (ZESTRIL) 10 MG tablet TAKE ONE TABLET DAILY Patient taking differently: Take 10 mg by mouth daily. 08/12/20  Yes Elayne Snare, MD  metFORMIN (GLUCOPHAGE-XR) 750 MG 24 hr tablet TAKE 1 TABLET WITH  DINNER Patient taking differently: Take 750 mg by mouth daily with breakfast. Take 1 tablet with dinner 03/17/21  Yes Elayne Snare, MD  ondansetron (ZOFRAN) 4 MG tablet Take 1 tablet (4 mg total) by mouth every 6 (six) hours as needed for nausea. 10/08/20  Yes Sheikh, Omair Latif, DO  potassium chloride SA (K-DUR,KLOR-CON) 20 MEQ tablet Take 20 mEq by mouth 2 (two) times daily.    Yes [provider]  rosuvastatin (CRESTOR) 20 MG tablet Take 20 mg by mouth daily.   Yes [provider]  sotorasib 120 MG TABS TAKE 960 MG BY MOUTH EVERY MORNING. Patient taking differently: Take 960 mg by mouth daily. 01/13/21 01/13/22 Yes  Curt Bears, MD  Tamsulosin HCl (FLOMAX) 0.4 MG CAPS Take 0.4 mg by mouth at bedtime.   Yes [provider]  Tiotropium Bromide Monohydrate (SPIRIVA RESPIMAT) 2.5 MCG/ACT AERS Inhale 2 puffs into the lungs daily. 02/06/21  Yes Collene Gobble, MD  vitamin B-12 (CYANOCOBALAMIN) 1000 MCG tablet Take 1,000 mcg by mouth at bedtime.   Yes [provider]  Blood Glucose Monitoring Suppl (CONTOUR NEXT MONITOR) w/Device KIT 1 Device by Does not apply route 3 (three) times daily. Use to check blood sugars 3 times daily. Dx Code E13.9 07/29/17   Elayne Snare, MD  CONTOUR NEXT TEST test strip USE 1 STRIP TO CHECK GLUCOSE 4 TIMES DAILY 10/13/20   Elayne Snare, MD  Insulin Pen Needle (EASY COMFORT PEN NEEDLES) 33G X 4 MM MISC 1 each by Does not apply route See admin instructions. Use to inject insulin 5 times daily. 05/31/19   Elayne Snare, MD  lidocaine (XYLOCAINE) 2 % solution Use as directed 15 mLs in the mouth or throat as needed for mouth pain. Patient not taking: Reported on 05/02/2021 09/19/20   Kinnie Feil, PA-C  OXYGEN Inhale 2 L into the lungs. At bedtime and during the day prn    [provider]  prednisoLONE acetate (PRED FORTE) 1 % ophthalmic suspension Place 1 drop into the right eye 4 (four) times daily. Patient not taking: No sig reported  02/05/21   Rankin, Clent Demark, MD  prochlorperazine (COMPAZINE) 10 MG tablet Take 1 tablet (10 mg total) by mouth every 6 (six) hours as needed for nausea or vomiting. 09/02/20   Curt Bears, MD    Inpatient Medications: Scheduled Meds: . allopurinol  100 mg Oral Daily  . amitriptyline  75 mg Oral QHS  . amLODipine  10 mg Oral Daily  . apixaban  5 mg Oral BID  . Chlorhexidine Gluconate Cloth  6 each Topical Daily  . furosemide  60 mg Intravenous BID  . insulin aspart  0-9 Units Subcutaneous TID WC  . insulin glargine  12 Units Subcutaneous QHS  . ketorolac  1 drop Right Eye TID AC & HS  . levothyroxine  75 mcg Oral QAC breakfast  . lisinopril  10 mg Oral Daily  . potassium chloride SA  20 mEq Oral BID  . rosuvastatin  20 mg Oral Daily  . sodium chloride flush  10-40 mL Intracatheter Q12H  . tamsulosin  0.4 mg Oral QHS  . umeclidinium bromide  1 puff Inhalation Daily  . vitamin B-12  1,000 mcg Oral QHS   Continuous Infusions:  PRN Meds: acetaminophen **OR** acetaminophen, albuterol, sodium chloride flush  Allergies:    Allergies  Allergen Reactions  . Actos [Pioglitazone] Swelling  . Spironolactone Itching  . Metformin And Related Nausea Only    3.16.2022 pt is currently taking this medication at home  . Niaspan [Niacin Er] Itching and Rash  . Wound Dressing Adhesive Rash    Social History:   Social History   Socioeconomic History  . Marital status: Married    Spouse name: Not on file  . Number of children: 3  . Years of education: 56  . Highest education level: Not on file  Occupational History  . Not on file  Tobacco Use  . Smoking status: Former Smoker    Packs/day: 2.00    Years: 20.00    Pack years: 40.00    Types: Cigarettes    Quit date: 01/25/1974    Years since quitting: 47.2  .  Smokeless tobacco: Never Used  Vaping Use  . Vaping Use: Never used  Substance and Sexual Activity  . Alcohol use: No    Alcohol/week: 0.0 standard drinks  . Drug use: No   . Sexual activity: Not on file  Other Topics Concern  . Not on file  Social History Narrative   Lives with wife and son   Right handed    Caffeine: maybe 1 cup/day   Social Determinants of Health   Financial Resource Strain: Not on file  Food Insecurity: Not on file  Transportation Needs: Not on file  Physical Activity: Not on file  Stress: Not on file  Social Connections: Not on file  Intimate Partner Violence: Not on file    Family History:    Family History  Problem Relation Age of Onset  . Heart disease Mother   . Coronary artery disease Father   . Cancer Maternal Grandmother   . Heart Problems Maternal Grandfather   . Diabetes Son        borderline      ROS:  Please see the history of present illness.   All other ROS reviewed and negative.     Physical Exam/Data:   Vitals:   05/02/21 1100 05/02/21 1200 05/02/21 1400 05/02/21 1517  BP: (!) 188/57 (!) 155/65 (!) 177/58 (!) 209/64  Pulse: (!) 46 60 66 81  Resp: '20 17 17 13  ' Temp:   97.9 F (36.6 C) 98 F (36.7 C)  TempSrc:   Oral Oral  SpO2: 100% 100% 100% 97%    Intake/Output Summary (Last 24 hours) at 05/02/2021 1531 Last data filed at 05/02/2021 1500 Gross per 24 hour  Intake 341.37 ml  Output 300 ml  Net 41.37 ml   Last 3 Weights 04/10/2021 04/08/2021 03/20/2021  Weight (lbs) 224 lb 3.2 oz 220 lb 12.8 oz 225 lb 9.6 oz  Weight (kg) 101.696 kg 100.154 kg 102.331 kg     There is no height or weight on file to calculate BMI.  General:  HOH, frail, on [place] HEENT: normal Lymph: no adenopathy Neck: no JVD Endocrine:  No thryomegaly Vascular: No carotid bruits; FA pulses 2+ bilaterally without bruits  Cardiac:  normal S1, S2; irregularly irregular; 3/6 systolic murmur at RUSB Lungs:  Mild crackles in the L lateral base  Abd: soft, nontender, no hepatomegaly  Ext: no edema Musculoskeletal:  No deformities, BUE and BLE strength normal and equal Skin: warm and dry  Neuro:  CNs 2-12 intact, no focal  abnormalities noted Psych:  Normal affect   EKG:  The EKG was personally reviewed and demonstrates: atrial flutter with bigeminy Telemetry:  Telemetry was personally reviewed and demonstrates: atrial flutter  Relevant CV Studies:  Echo 02/27/2021 1. Left ventricular ejection fraction, by estimation, is 40 to 45%. The  left ventricle has mildly decreased function. The left ventricle  demonstrates global hypokinesis. There is mild asymmetric left ventricular  hypertrophy. Left ventricular diastolic  parameters are indeterminate.  2. Right ventricular systolic function was not well visualized. The right  ventricular size is not well visualized. There is severely elevated  pulmonary artery systolic pressure. The estimated right ventricular  systolic pressure is 03.0 mmHg.  3. Right atrial size was mildly dilated.  4. The mitral valve is grossly normal. Trivial mitral valve  regurgitation.  5. The aortic valve is grossly normal. There is mild calcification of the  aortic valve. There is mild thickening of the aortic valve. Aortic valve  regurgitation is trivial.  Mild aortic valve stenosis.   Laboratory Data:  High Sensitivity Troponin:   Recent Labs  Lab 05/02/21 0151 05/02/21 0200  TROPONINIHS 55* 57*     Chemistry Recent Labs  Lab 05/01/21 1618  NA 139  K 4.5  CL 105  CO2 26  GLUCOSE 248*  BUN 20  CREATININE 1.54*  CALCIUM 9.0  GFRNONAA 45*  ANIONGAP 8    Recent Labs  Lab 05/01/21 1618  PROT 6.3*  ALBUMIN 3.6  AST 20  ALT 11  ALKPHOS 72  BILITOT 1.0   Hematology Recent Labs  Lab 05/01/21 1618  WBC 3.5*  RBC 3.77*  HGB 11.0*  HCT 35.9*  MCV 95.2  MCH 29.2  MCHC 30.6  RDW 16.3*  PLT 126*   BNP Recent Labs  Lab 05/02/21 0152  BNP 692.7*    DDimer No results for input(s): DDIMER in the last 168 hours.   Radiology/Studies:  DG Chest 1 View  Result Date: 05/01/2021 CLINICAL DATA:  Shortness of breath. EXAM: CHEST  1 VIEW COMPARISON:   March 14, 2021 FINDINGS: Stable cardiomediastinal silhouette. Sternotomy wires are noted. No pneumothorax is noted. Right internal jugular Port-A-Cath is unchanged in position. Mild bibasilar atelectasis or infiltrates are noted. Small pleural effusions may be present. Bony thorax is unremarkable. IMPRESSION: Mild bibasilar subsegmental atelectasis or infiltrates. Possible small pleural effusions. Electronically Signed   By: Marijo Conception M.D.   On: 05/01/2021 17:06     Assessment and Plan:   1. Acute on chronic respiratory insufficiency  -Although initially concern for pneumonia, however procalcitonin was negative.  Presenting symptom is more concerning for heart failure.  Patient has been placed on 60 mg twice a day of IV Lasix.  On exam, he only has trace amount of lower extremity edema.  Majority of his lung is clear other than mild crackles in the left lateral base of the lung.  -Suspect his chronic respiratory insufficiency is multifactorial due to combination of lung cancer, chronic systolic heart failure, persistent atrial fibrillation and deconditioning.  2. Acute on chronic systolic heart failure:   -BNP on arrival 692.7.  Recent echocardiogram revealed very elevated RVSP of 78 mmHg.  -continue IV lasix for now. Suspect patient may decompensate faster even if only mildly volume overloaded.   3. Persistent atrial fibrillation: On Eliquis. No AV nodal blocking agent given prolonged 1st degree AV block and Mobiz 1 on previous EKG  4. Mobitz 1 heart block:  5. Obstructive sleep apnea: Noncompliant with CPAP therapy.  Patient says he feels better on nasal cannula oxygen therapy instead of CPAP therapy.  Wife mentions he previously keep ripping the CPAP mask off in the middle of the night.  6. Hypertension: BP has been labile in the hospital. Consider IV hydralazine. Avoid AV nodal blocking agent as patient has intermittent Mobitz 1 and 1st degree  AVB  7. Hyperlipidemia  8. Insulin-dependent diabetes mellitus  9. Venous insufficiency: trace edema in bilateral lower extremity  10. Stage IV non-small cell carcinoma: Followed by oncology service.  Family is concerned that he has not been receiving his chemotherapy while admitted.   Risk Assessment/Risk Scores:        New York Heart Association (NYHA) Functional Class NYHA Class III  CHA2DS2-VASc Score = 8  This indicates a 10.8% annual risk of stroke. The patient's score is based upon: CHF History: Yes HTN History: Yes Diabetes History: Yes Stroke History: Yes Vascular Disease History: Yes Age Score: 2 Gender Score: 0  For questions or updates, please contact [place] Please consult [email or web address] for contact info under    Hilbert Corrigan, [place]  05/02/2021 3:31 PM

## 2021-05-02 NOTE — Progress Notes (Signed)
Spoke with pt about wearing CPAP. Pt states he does have CPAP machine at home, but does not use it. Pt states he is more comfortable with only O2. RT will continue to monitor.

## 2021-05-02 NOTE — H&P (Addendum)
History and Physical    Randall Wilkins AJO:[id number] DOB: Oct 11, 1941 DOA: 05/01/2021  PCP: [name], MD   Patient coming from: Home.  Chief Complaint: Shortness of breath.  HPI: Randall Wilkins is a 80 y.o. male with history of chronic systolic heart failure, paroxysmal atrial fibrillation, CAD s/p CABG, hypertension, diabetes mellitus and non-small cell lung cancer stage IV being followed by Dr. Julien Nordmann oncologist presents to the ER because of increasing shortness of breath over the last 48 hours.  Patient shortness of breath is increased on exertion and on lying down.  Has had some cough with some sputum production.  Denies fever chills chest pain.  Has not noticed any gaining weight.  ED Course: In the ER patient was in A. fib rate controlled.  Chest x-ray shows possible fluid effusion and possible infiltrate.  Labs show pancytopenia with creatinine 1.5 glucose 248 and COVID test were negative.  Blood cultures were obtained patient was started on empiric antibiotics for possible pneumonia and admitted for further management.  On my exam patient looks mildly short of breath and has features concerning for CHF and I placed patient on Lasix.  Review of Systems: As per HPI, rest all negative.   Past Medical History:  Diagnosis Date  . Aortic stenosis, mild 11/14/2013  . Aortic valve sclerosis 03/29/2015  . Bilateral leg edema 05/21/2014  . CAD (coronary artery disease)   . Chronic diastolic congestive heart failure ([place])   . Chronic kidney disease (CKD), stage III (moderate) (HCC)   . Diabetes 1.5, managed as type 1 ([place]) 02/04/2013  . Diabetic peripheral neuropathy associated with type 1 diabetes mellitus ([place]) 11/14/2013  . Dysphagia   . Dyspnea on exertion 03/21/2015  . Exogenous obesity   . Gout   . Heart attack ([place])   . History of nuclear stress test 07/2011   dipyridamole; fixed inferolateral defect, worse at stress than rest; no reversible ischemia; low risk scan   .  Hyperlipidemia   . Hypertension   . Hypothyroidism   . Insulin dependent diabetes mellitus   . Left foot drop   . Left main coronary artery disease 03/28/2015  . lung ca dx'd 08/2020  . Memory loss   . Obesity (BMI 30-39.9) 11/14/2013  . Obstructive sleep apnea 03/21/2015  . OSA on CPAP    uses a cpap  . Peptic ulcer with hemorrhage 03/28/2015  . Peripheral neuropathy   . Pneumonia   . Rhabdomyolysis   . 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 Stroke ([place])    L patietal with small scattered lacunar infarcts  . Thrombocytopenia ([place]) 03/21/2015  . Venous insufficiency   . Weakness generalized 03/21/2015    Past Surgical History:  Procedure Laterality Date  . BACK SURGERY  2002   lumbosacral. 11 back surgeries total  . BRONCHIAL BIOPSY  08/06/2020   Procedure: BRONCHIAL BIOPSIES;  Surgeon: Collene Gobble, MD;  Location[facility];  Service: Pulmonary;;  . BRONCHIAL BRUSHINGS  08/06/2020   Procedure: BRONCHIAL BRUSHINGS;  Surgeon: Collene Gobble, MD;  Location: [facility] ENDOSCOPY;  Service: Pulmonary;;  . BRONCHIAL NEEDLE ASPIRATION BIOPSY  08/06/2020   Procedure: BRONCHIAL NEEDLE ASPIRATION BIOPSIES;  Surgeon: Collene Gobble, MD;  Location: [facility] ENDOSCOPY;  Service: Pulmonary;;  . Carotid Doppler  03/2013   bilat bulb/prox ICAs - mild amount of fibrous plaque with no evidence of diameter reduction  . CARPAL TUNNEL RELEASE Bilateral  08/09/2014   Procedure: BILATERAL CARPAL TUNNEL RELEASE;  Surgeon: Daryll Brod, MD;  Location: [place];  Service: Orthopedics;  Laterality: Bilateral;  ANESTHESIA:  IV REGIONAL BIL FAB  . CHOLECYSTECTOMY    . COLONOSCOPY    . CORONARY ANGIOPLASTY  10/13/1996  . CORONARY ANGIOPLASTY  09/21/1989   emergency PTCA  . CORONARY ANGIOPLASTY  10/13/1996   Multi-Link diagonal & OD stenting (Dr. Marella Chimes)  . CORONARY ANGIOPLASTY  12/03/1997   disease of mid DX-1 ~50% & in mid PLA & PDA (distal lesions) (Dr. Marella Chimes)   . CORONARY ANGIOPLASTY  10/14/1999   progression of disease distal PLA & PDA; progression of disease prox RCA - moderate (Dr. Marella Chimes)   . CORONARY ANGIOPLASTY WITH STENT PLACEMENT  04/04/2004   4.0x68m non-DES (thrombectomy via AngioJet) to RCA for high grade stenosis (Dr. RMarella Chimes  . CORONARY ARTERY BYPASS GRAFT N/A 03/29/2015   Procedure: CORONARY ARTERY BYPASS GRAFTING TIMES TWO USING LEFT INTERNAL MAMMARY ARTERY AND RIGHT LEG GREATER SAPHENOUS VEIN HARVESTED ENDOSCOPICALLY.;  Surgeon: CRexene Alberts MD;  [place] [facility]  Service: Open Heart Surgery;  Laterality: N/A;  . ESOPHAGOGASTRODUODENOSCOPY N/A 03/27/2015   Procedure: ESOPHAGOGASTRODUODENOSCOPY (EGD);  Surgeon: MClarene Essex MD;  [facility];  Service: Endoscopy;  Laterality: N/A;  possible dilation  . ESOPHAGOGASTRODUODENOSCOPY N/A 10/05/2020   Procedure: ESOPHAGOGASTRODUODENOSCOPY (EGD);  Surgeon: OArta Silence MD;  [facility];  Service: Endoscopy;  Laterality: N/A;  . FINE NEEDLE ASPIRATION  08/06/2020   Procedure: FINE NEEDLE ASPIRATION (FNA) LINEAR;  Surgeon: BCollene Gobble MD;  [facility];  Service: Pulmonary;;  . IR IMAGING GUIDED PORT INSERTION  10/07/2020  . LEFT HEART CATHETERIZATION WITH CORONARY ANGIOGRAM N/A 03/28/2015   Procedure: LEFT HEART CATHETERIZATION WITH CORONARY ANGIOGRAM;  Surgeon: Peter M JMartinique MD;  [facility];  Service: Cardiovascular;  Laterality: N/A;  . SINUS ENDO W/FUSION    . TONSILLECTOMY    . TRANSTHORACIC ECHOCARDIOGRAM  08/08/2013   EF 55-60%, mild conc hypertrophy, grade 1 diastolic dysfunction; AV with mild stenosis; LA & RA mildly dilated  . VIDEO BRONCHOSCOPY WITH ENDOBRONCHIAL NAVIGATION N/A 08/06/2020   Procedure: VIDEO BRONCHOSCOPY WITH ENDOBRONCHIAL NAVIGATION;  Surgeon: BCollene Gobble MD;  [facility];  Service: Pulmonary;  Laterality: N/A;  . VIDEO BRONCHOSCOPY WITH ENDOBRONCHIAL ULTRASOUND N/A 08/06/2020    Procedure: VIDEO BRONCHOSCOPY WITH ENDOBRONCHIAL ULTRASOUND;  Surgeon: BCollene Gobble MD;  [facility];  Service: Pulmonary;  Laterality: N/A;     reports that he quit smoking about 47 years ago. His smoking use included cigarettes. He has a 40.00 pack-year smoking history. He has never used smokeless tobacco. He reports that he does not drink alcohol and does not use drugs.  Allergies  Allergen Reactions  . Actos [Pioglitazone] Swelling  . Spironolactone Itching  . Metformin And Related Nausea Only    3.16.2022 pt is currently taking this medication at home  . Niaspan [Niacin Er] Itching and Rash  . Wound Dressing Adhesive Rash    Family History  Problem Relation Age of Onset  . Heart disease Mother   . Coronary artery disease Father   . Cancer Maternal Grandmother   . Heart Problems Maternal Grandfather   . Diabetes Son        borderline     Prior to Admission medications   Medication Sig Start Date End Date Taking? Authorizing Provider  albuterol (VENTOLIN HFA) 108 (90 Base) MCG/ACT inhaler Inhale 2  puffs into the lungs every 6 (six) hours as needed for wheezing or shortness of breath. 09/04/20   Collene Gobble, MD  allopurinol (ZYLOPRIM) 100 MG tablet Take 1 tablet (100 mg total) by mouth daily. 10/18/20   Hilty, Nadean Corwin, MD  amitriptyline (ELAVIL) 75 MG tablet Take 75 mg by mouth at bedtime. Take 0.5 tablets daily HS 12/10/20   [provider]  amLODipine (NORVASC) 10 MG tablet TAKE ONE TABLET BY MOUTH ONCE DAILY 03/17/21   Hilty, Nadean Corwin, MD  apixaban (ELIQUIS) 2.5 MG TABS tablet Take 2.5 mg by mouth 2 (two) times daily. Patient not taking: No sig reported    [provider]  apixaban (ELIQUIS) 2.5 MG TABS tablet Take 2.5 mg by mouth in the morning and at bedtime. Patient not taking: No sig reported 10/12/20   [provider]  apixaban (ELIQUIS) 5 MG TABS tablet Take 1 tablet (5 mg total) by mouth 2 (two) times daily. 03/11/21   Hilty,  Nadean Corwin, MD  Blood Glucose Monitoring Suppl (CONTOUR NEXT MONITOR) w/Device KIT 1 Device by Does not apply route 3 (three) times daily. Use to check blood sugars 3 times daily. Dx Code E13.9 07/29/17   Elayne Snare, MD  CONTOUR NEXT TEST test strip USE 1 STRIP TO CHECK GLUCOSE 4 TIMES DAILY 10/13/20   Elayne Snare, MD  furosemide (LASIX) 20 MG tablet Take 60 mg in the morning and 40 mg in the evening. 03/31/21   Hilty, Nadean Corwin, MD  insulin degludec (TRESIBA FLEXTOUCH) 200 UNIT/ML FlexTouch Pen Inject 24 Units into the skin at bedtime. Patient taking differently: Inject 14 Units into the skin at bedtime. 10/08/20   Sheikh, Omair Latif, DO  insulin lispro (HUMALOG) 100 UNIT/ML cartridge Inject 5-15 Units into the skin 3 (three) times daily with meals. Sliding scale.    [provider]  Insulin Pen Needle (EASY COMFORT PEN NEEDLES) 33G X 4 MM MISC 1 each by Does not apply route See admin instructions. Use to inject insulin 5 times daily. 05/31/19   Elayne Snare, MD  ketorolac (ACULAR) 0.5 % ophthalmic solution Place 1 drop into the right eye 4 (four) times daily. 04/02/21 04/02/22  Rankin, Clent Demark, MD  levothyroxine (SYNTHROID, LEVOTHROID) 75 MCG tablet Take 75 mcg by mouth daily before breakfast.  05/14/17   [provider]  lidocaine (XYLOCAINE) 2 % solution Use as directed 15 mLs in the mouth or throat as needed for mouth pain. 09/19/20   Kinnie Feil, PA-C  lidocaine-prilocaine (EMLA) cream APPLY A TEASPOON OVER PORT SITE AT LEASTONE HOUR BEFORE LAB APPOINTMENT. DO NOT RUB IN AND COVER WITH PLASTIC WRAP 04/08/21   Curt Bears, MD  lisinopril (ZESTRIL) 10 MG tablet TAKE ONE TABLET DAILY Patient taking differently: Take 10 mg by mouth daily. 08/12/20   Elayne Snare, MD  metFORMIN (GLUCOPHAGE-XR) 750 MG 24 hr tablet TAKE 1 TABLET WITH DINNER 03/17/21   Elayne Snare, MD  ondansetron (ZOFRAN) 4 MG tablet Take 1 tablet (4 mg total) by mouth every 6 (six) hours as needed for nausea. 10/08/20    Raiford Noble Latif, DO  OXYGEN Inhale 2 L into the lungs. At bedtime and during the day prn    [provider]  potassium chloride SA (K-DUR,KLOR-CON) 20 MEQ tablet Take 20 mEq by mouth 2 (two) times daily.     [provider]  prednisoLONE acetate (PRED FORTE) 1 % ophthalmic suspension Place 1 drop into the right eye 4 (four) times  daily. 02/05/21   Rankin, Clent Demark, MD  prochlorperazine (COMPAZINE) 10 MG tablet Take 1 tablet (10 mg total) by mouth every 6 (six) hours as needed for nausea or vomiting. 09/02/20   Curt Bears, MD  rosuvastatin (CRESTOR) 20 MG tablet Take 20 mg by mouth daily.    [provider]  sotorasib 120 MG TABS TAKE 960 MG BY MOUTH EVERY MORNING. 01/13/21 01/13/22  Curt Bears, MD  Tamsulosin HCl (FLOMAX) 0.4 MG CAPS Take 0.4 mg by mouth at bedtime.    [provider]  Tiotropium Bromide Monohydrate (SPIRIVA RESPIMAT) 2.5 MCG/ACT AERS Inhale 2 puffs into the lungs daily. 02/06/21   Collene Gobble, MD  vitamin B-12 (CYANOCOBALAMIN) 1000 MCG tablet Take 1,000 mcg by mouth at bedtime.    [provider]    Physical Exam: Constitutional: Moderately built and nourished. Vitals:   05/01/21 2006 05/01/21 2015 05/01/21 2045 05/01/21 2300  BP: (!) 118/99 (!) 149/56 (!) 147/68 126/74  Pulse: 68 (!) 44 (!) 103 (!) 31  Resp: 19 (!) 21 (!) 28 (!) 23  Temp:      TempSrc:      SpO2: 95% 95% 95% 100%   Eyes: Anicteric no pallor. ENMT: No discharge from the ears eyes nose and mouth: Neck: JVD mildly elevated no mass felt.  No neck rigidity. Respiratory: No rhonchi or crepitations. Cardiovascular: S1-S2 heard. Abdomen: Soft nontender bowel sound present. Musculoskeletal: Mild edema. Skin: No rash. Neurologic: Alert awake oriented to time place and person.  Moves all extremities. Psychiatric: Appears normal.  Normal affect.   Labs on Admission: I have personally reviewed following labs and imaging studies  CBC: Recent Labs   Lab 05/01/21 1618  WBC 3.5*  NEUTROABS 2.2  HGB 11.0*  HCT 35.9*  MCV 95.2  PLT 532*   Basic Metabolic Panel: Recent Labs  Lab 05/01/21 1618  NA 139  K 4.5  CL 105  CO2 26  GLUCOSE 248*  BUN 20  CREATININE 1.54*  CALCIUM 9.0   GFR: CrCl cannot be calculated (Unknown ideal weight.). Liver Function Tests: Recent Labs  Lab 05/01/21 1618  AST 20  ALT 11  ALKPHOS 72  BILITOT 1.0  PROT 6.3*  ALBUMIN 3.6   No results for input(s): LIPASE, AMYLASE in the last 168 hours. No results for input(s): AMMONIA in the last 168 hours. Coagulation Profile: No results for input(s): INR, PROTIME in the last 168 hours. Cardiac Enzymes: No results for input(s): CKTOTAL, CKMB, CKMBINDEX, TROPONINI in the last 168 hours. BNP (last 3 results) No results for input(s): PROBNP in the last 8760 hours. HbA1C: No results for input(s): HGBA1C in the last 72 hours. CBG: No results for input(s): GLUCAP in the last 168 hours. Lipid Profile: No results for input(s): CHOL, HDL, LDLCALC, TRIG, CHOLHDL, LDLDIRECT in the last 72 hours. Thyroid Function Tests: No results for input(s): TSH, T4TOTAL, FREET4, T3FREE, THYROIDAB in the last 72 hours. Anemia Panel: No results for input(s): VITAMINB12, FOLATE, FERRITIN, TIBC, IRON, RETICCTPCT in the last 72 hours. Urine analysis:    Component Value Date/Time   COLORURINE YELLOW 02/26/2021 1221   APPEARANCEUR CLEAR 02/26/2021 1221   LABSPEC 1.009 02/26/2021 1221   PHURINE 6.0 02/26/2021 1221   GLUCOSEU NEGATIVE 02/26/2021 1221   GLUCOSEU NEGATIVE 08/29/2019 0959   HGBUR SMALL (A) 02/26/2021 1221   BILIRUBINUR NEGATIVE 02/26/2021 1221   KETONESUR NEGATIVE 02/26/2021 1221   PROTEINUR 100 (A) 02/26/2021 1221   UROBILINOGEN 0.2 08/29/2019 0959   NITRITE NEGATIVE  02/26/2021 [place] 02/26/2021 1221   Sepsis Labs: _0 (procalcitonin:4,lacticidven:4) ) Recent Results (from the past 240 hour(s))  Resp Panel by RT-PCR (Flu  A&B, Covid) Nasopharyngeal Swab     Status: None   Collection Time: 05/01/21  4:21 PM   Specimen: Nasopharyngeal Swab; Nasopharyngeal(NP) swabs in vial transport medium  Result Value Ref Range Status   SARS Coronavirus 2 by RT PCR NEGATIVE NEGATIVE Final    Comment: (NOTE) SARS-CoV-2 target nucleic acids are NOT DETECTED.  The SARS-CoV-2 RNA is generally detectable in upper respiratory specimens during the acute phase of infection. The lowest concentration of SARS-CoV-2 viral copies this assay can detect is 138 copies/mL. A negative result does not preclude SARS-Cov-2 infection and should not be used as the sole basis for treatment or other patient management decisions. A negative result may occur with  improper specimen collection/handling, submission of specimen other than nasopharyngeal swab, presence of viral mutation(s) within the areas targeted by this assay, and inadequate number of viral copies(<138 copies/mL). A negative result must be combined with clinical observations, patient history, and epidemiological information. The expected result is Negative.  Fact Sheet for Patients:  EntrepreneurPulse.com.au  Fact Sheet for Healthcare Providers:  IncredibleEmployment.be  This test is no t yet approved or cleared by the [place] FDA and  has been authorized for detection and/or diagnosis of SARS-CoV-2 by FDA under an Emergency Use Authorization (EUA). This EUA will remain  in effect (meaning this test can be used) for the duration of the COVID-19 declaration under Section 564(b)(1) of the Act, 21 U.S.C.section 360bbb-3(b)(1), unless the authorization is terminated  or revoked sooner.       Influenza A by PCR NEGATIVE NEGATIVE Final   Influenza B by PCR NEGATIVE NEGATIVE Final    Comment: (NOTE) The Xpert Xpress SARS-CoV-2/FLU/RSV plus assay is intended as an aid in the diagnosis of influenza from Nasopharyngeal swab specimens  and should not be used as a sole basis for treatment. Nasal washings and aspirates are unacceptable for Xpert Xpress SARS-CoV-2/FLU/RSV testing.  Fact Sheet for Patients: EntrepreneurPulse.com.au  Fact Sheet for Healthcare Providers: IncredibleEmployment.be  This test is not yet approved or cleared by the [place] FDA and has been authorized for detection and/or diagnosis of SARS-CoV-2 by FDA under an Emergency Use Authorization (EUA). This EUA will remain in effect (meaning this test can be used) for the duration of the COVID-19 declaration under Section 564(b)(1) of the Act, 21 U.S.C. section 360bbb-3(b)(1), unless the authorization is terminated or revoked.  Performed at [facility], [place] [address] [place], [place] [address]      Radiological Exams on Admission: DG Chest 1 View  Result Date: 05/01/2021 CLINICAL DATA:  Shortness of breath. EXAM: CHEST  1 VIEW COMPARISON:  March 14, 2021 FINDINGS: Stable cardiomediastinal silhouette. Sternotomy wires are noted. No pneumothorax is noted. Right internal jugular Port-A-Cath is unchanged in position. Mild bibasilar atelectasis or infiltrates are noted. Small pleural effusions may be present. Bony thorax is unremarkable. IMPRESSION: Mild bibasilar subsegmental atelectasis or infiltrates. Possible small pleural effusions. Electronically Signed   By: Marijo Conception M.D.   On: 05/01/2021 17:06    EKG: Independently reviewed.  Atrial flutter rate controlled.  Assessment/Plan Principal Problem:   CAP (community acquired pneumonia) Active Problems:   Hyperlipidemia   CAD S/P percutaneous coronary angioplasty   Type 1 diabetes mellitus with nephropathy (HCC)   Thrombocytopenia (HCC)   Obstructive sleep apnea   Chronic diastolic congestive heart  failure (HCC)   Chronic kidney disease (CKD), stage III (moderate) (HCC)   S/P CABG x 2   Essential hypertension   Adenocarcinoma of right  lung, stage 4 ([place])    1. Shortness of breath could be most likely decompensated acute on chronic systolic heart failure with last EF measured in March 2022 was 40 to 45%.  I placed patient on Lasix 60 mg IV every 12.  Patient is already on lisinopril.  Note that patient also has chronic kidney disease so we will closely monitor metabolic panel intake output and daily weights.  Given the patient's cough with chest x-ray showing possible infiltrates antibiotics were empirically started.  Check procalcitonin and if negative may discontinue.  Check BNP and troponin. 2. Chronic kidney disease stage III creatinine appears to be at baseline as discussed earlier patient is on IV Lasix and lisinopril closely monitor metabolic panel. 3. Pancytopenia likely from malignancy and immunotherapy.  Follow CBC. 4. Diabetes mellitus type 2 on Tresiba 20 units. 5. CAD status post CABG denies any chest pain. 6. A. fib rate controlled.  On Eliquis. 7. Stage IV lung cancer being followed by Dr. Julien Nordmann on [place]. 8. History of sleep apnea on CPAP at bedtime. 9. Hypertension on amlodipine and lisinopril. 10. Hypothyroidism on Synthroid.  Check TSH with next blood draw. 11. History of gout on allopurinol.  Given that patient has exertional symptoms with orthopnea consistent with CHF and possible pneumonia immunosuppressed state will need close monitoring for any further worsening in inpatient status.   DVT prophylaxis: Apixaban. Code Status: Full code. Family Communication: Patient's wife. Disposition Plan: Home. Consults called: None. Admission status: Inpatient.   Rise Patience MD [facility] Pager [phone].  If 7PM-7AM, please contact night-coverage [email or web address] Password [email or web address]  05/02/2021, 12:01 AM

## 2021-05-02 NOTE — ED Notes (Addendum)
Pt was found sitting at the end off the bed confused. Pt had rpped all of his monitoring devices out and he ripped his accessed port out. I and another nurse got the pt to get back in the bed and put everything on him. When I asked him orientation questions and he was A&Ox4.

## 2021-05-02 NOTE — ED Notes (Signed)
Report attempted rn to attempt again

## 2021-05-03 DIAGNOSIS — C3491 Malignant neoplasm of unspecified part of right bronchus or lung: Secondary | ICD-10-CM

## 2021-05-03 DIAGNOSIS — G4733 Obstructive sleep apnea (adult) (pediatric): Secondary | ICD-10-CM

## 2021-05-03 DIAGNOSIS — I5043 Acute on chronic combined systolic (congestive) and diastolic (congestive) heart failure: Secondary | ICD-10-CM | POA: Diagnosis not present

## 2021-05-03 DIAGNOSIS — I5021 Acute systolic (congestive) heart failure: Secondary | ICD-10-CM | POA: Diagnosis not present

## 2021-05-03 DIAGNOSIS — N183 Chronic kidney disease, stage 3 unspecified: Secondary | ICD-10-CM | POA: Diagnosis not present

## 2021-05-03 LAB — BASIC METABOLIC PANEL
Anion gap: 6 (ref 5–15)
BUN: 16 mg/dL (ref 8–23)
CO2: 30 mmol/L (ref 22–32)
Calcium: 8.8 mg/dL — ABNORMAL LOW (ref 8.9–10.3)
Chloride: 106 mmol/L (ref 98–111)
Creatinine, Ser: 1.48 mg/dL — ABNORMAL HIGH (ref 0.61–1.24)
GFR, Estimated: 48 mL/min — ABNORMAL LOW (ref >60)
Glucose, Bld: 119 mg/dL — ABNORMAL HIGH (ref 70–99)
Potassium: 3.5 mmol/L (ref 3.5–5.1)
Sodium: 142 mmol/L (ref 135–145)

## 2021-05-03 LAB — GLUCOSE, CAPILLARY
Glucose-Capillary: 104 mg/dL — ABNORMAL HIGH (ref 70–99)
Glucose-Capillary: 197 mg/dL — ABNORMAL HIGH (ref 70–99)
Glucose-Capillary: 167 mg/dL — ABNORMAL HIGH (ref 70–99)
Glucose-Capillary: 141 mg/dL — ABNORMAL HIGH (ref 70–99)

## 2021-05-03 NOTE — Progress Notes (Signed)
DAILY PROGRESS NOTE   Patient Name: Randall Wilkins Date of Encounter: 05/03/2021 Cardiologist: Pixie Casino, MD  Chief Complaint   Breathing better  Patient Profile   Randall Wilkins is a 80 y.o. male with a hx of chronic respiratory failure on home O2, hypertension, hyperlipidemia, IDDM, venous insufficiency, OSA not on CPAP therapy, CAD s/p CABG 2016, persistent atrial fibrillation, history of Mobitz 1 heart block, and stage IV non-small cell lung cancer who is being seen 05/02/2021 for the evaluation of dyspnea at the request of Dr. Hal Hope.   Subjective   Diuresed close to 1 L overnight now 1.6 L negative.  Breathing has improved.  He remains in a rate controlled atrial flutter with a 5-1 conduction.  He did have some visual hallucinations which have improved somewhat.  Pressure has improved today with medication adjustment.  Objective   Vitals:   05/03/21 0400 05/03/21 0634 05/03/21 0745 05/03/21 1058  BP: 139/64  135/60 (!) 160/70  Pulse:   (!) 49 (!) 48  Resp:   20 19  Temp:   97.8 F (36.6 C) (!) 97.4 F (36.3 C)  TempSrc:   Oral Axillary  SpO2:   99% 97%  Weight:  101.2 kg      Intake/Output Summary (Last 24 hours) at 05/03/2021 1318 Last data filed at 05/03/2021 1059 Gross per 24 hour  Intake 850 ml  Output 1825 ml  Net -975 ml   Filed Weights   05/03/21 0634  Weight: 101.2 kg    Physical Exam   General appearance: alert, no distress and pale Neck: JVD - a few cm above sternal notch, no carotid bruit and thyroid not enlarged, symmetric, no tenderness/mass/nodules Lungs: diminished breath sounds bilaterally Heart: regular rate and rhythm, S1, S2 normal and systolic murmur: systolic ejection 3/6, blowing at 2nd right intercostal space Abdomen: soft, non-tender; bowel sounds normal; no masses,  no organomegaly Extremities: extremities normal, atraumatic, no cyanosis or edema Pulses: 2+ and symmetric Skin: Pale, warm, dry Neurologic: Grossly  normal Psych: Pleasant, quiet, very hard of hearing  Inpatient Medications    Scheduled Meds: . allopurinol  100 mg Oral Daily  . amitriptyline  75 mg Oral QHS  . amLODipine  10 mg Oral Daily  . apixaban  5 mg Oral BID  . Chlorhexidine Gluconate Cloth  6 each Topical Daily  . furosemide  60 mg Intravenous BID  . insulin aspart  0-9 Units Subcutaneous TID WC  . insulin glargine  12 Units Subcutaneous QHS  . isosorbide-hydrALAZINE  1 tablet Oral BID  . ketorolac  1 drop Right Eye TID AC & HS  . levothyroxine  75 mcg Oral QAC breakfast  . lisinopril  10 mg Oral Daily  . potassium chloride SA  20 mEq Oral BID  . rosuvastatin  20 mg Oral Daily  . sodium chloride flush  10-40 mL Intracatheter Q12H  . tamsulosin  0.4 mg Oral QHS  . umeclidinium bromide  1 puff Inhalation Daily  . vitamin B-12  1,000 mcg Oral QHS    Continuous Infusions:   PRN Meds: acetaminophen **OR** acetaminophen, albuterol, sodium chloride flush   Labs   Results for orders placed or performed during the hospital encounter of 05/01/21 (from the past 48 hour(s))  CBC with Differential     Status: Abnormal   Collection Time: 05/01/21  4:18 PM  Result Value Ref Range   WBC 3.5 (L) 4.0 - 10.5 K/uL   RBC 3.77 (L) 4.22 -  5.81 MIL/uL   Hemoglobin 11.0 (L) 13.0 - 17.0 g/dL   HCT 35.9 (L) 39.0 - 52.0 %   MCV 95.2 80.0 - 100.0 fL   MCH 29.2 26.0 - 34.0 pg   MCHC 30.6 30.0 - 36.0 g/dL   RDW 16.3 (H) 11.5 - 15.5 %   Platelets 126 (L) 150 - 400 K/uL   nRBC 0.0 0.0 - 0.2 %   Neutrophils Relative % 62 %   Neutro Abs 2.2 1.7 - 7.7 K/uL   Lymphocytes Relative 21 %   Lymphs Abs 0.7 0.7 - 4.0 K/uL   Monocytes Relative 14 %   Monocytes Absolute 0.5 0.1 - 1.0 K/uL   Eosinophils Relative 1 %   Eosinophils Absolute 0.0 0.0 - 0.5 K/uL   Basophils Relative 1 %   Basophils Absolute 0.1 0.0 - 0.1 K/uL   Immature Granulocytes 1 %   Abs Immature Granulocytes 0.02 0.00 - 0.07 K/uL    Comment: Performed at [facility] [address], [place], [place] [address]  Comprehensive metabolic panel     Status: Abnormal   Collection Time: 05/01/21  4:18 PM  Result Value Ref Range   Sodium 139 135 - 145 mmol/L   Potassium 4.5 3.5 - 5.1 mmol/L   Chloride 105 98 - 111 mmol/L   CO2 26 22 - 32 mmol/L   Glucose, Bld 248 (H) 70 - 99 mg/dL    Comment: Glucose reference range applies only to samples taken after fasting for at least 8 hours.   BUN 20 8 - 23 mg/dL   Creatinine, Ser 1.54 (H) 0.61 - 1.24 mg/dL   Calcium 9.0 8.9 - 10.3 mg/dL   Total Protein 6.3 (L) 6.5 - 8.1 g/dL   Albumin 3.6 3.5 - 5.0 g/dL   AST 20 15 - 41 U/L   ALT 11 0 - 44 U/L   Alkaline Phosphatase 72 38 - 126 U/L   Total Bilirubin 1.0 0.3 - 1.2 mg/dL   GFR, Estimated 45 (L) >60 mL/min    Comment: (NOTE) Calculated using the CKD-EPI Creatinine Equation (2021)    Anion gap 8 5 - 15    Comment: Performed at [facility] [address] [place], [place] [address]  Resp Panel by RT-PCR (Flu A&B, Covid) Nasopharyngeal Swab     Status: None   Collection Time: 05/01/21  4:21 PM   Specimen: Nasopharyngeal Swab; Nasopharyngeal(NP) swabs in vial transport medium  Result Value Ref Range   SARS Coronavirus 2 by RT PCR NEGATIVE NEGATIVE    Comment: (NOTE) SARS-CoV-2 target nucleic acids are NOT DETECTED.  The SARS-CoV-2 RNA is generally detectable in upper respiratory specimens during the acute phase of infection. The lowest concentration of SARS-CoV-2 viral copies this assay can detect is 138 copies/mL. A negative result does not preclude SARS-Cov-2 infection and should not be used as the sole basis for treatment or other patient management decisions. A negative result may occur with  improper specimen collection/handling, submission of specimen other than nasopharyngeal swab, presence of viral mutation(s) within the areas targeted by this assay, and inadequate number of viral copies(<138 copies/mL). A negative result must be combined  with clinical observations, patient history, and epidemiological information. The expected result is Negative.  Fact Sheet for Patients:  EntrepreneurPulse.com.au  Fact Sheet for Healthcare Providers:  IncredibleEmployment.be  This test is no t yet approved or cleared by the [place] FDA and  has been authorized for detection and/or diagnosis of  SARS-CoV-2 by FDA under an Emergency Use Authorization (EUA). This EUA will remain  in effect (meaning this test can be used) for the duration of the COVID-19 declaration under Section 564(b)(1) of the Act, 21 U.S.C.section 360bbb-3(b)(1), unless the authorization is terminated  or revoked sooner.       Influenza A by PCR NEGATIVE NEGATIVE   Influenza B by PCR NEGATIVE NEGATIVE    Comment: (NOTE) The Xpert Xpress SARS-CoV-2/FLU/RSV plus assay is intended as an aid in the diagnosis of influenza from Nasopharyngeal swab specimens and should not be used as a sole basis for treatment. Nasal washings and aspirates are unacceptable for Xpert Xpress SARS-CoV-2/FLU/RSV testing.  Fact Sheet for Patients: EntrepreneurPulse.com.au  Fact Sheet for Healthcare Providers: IncredibleEmployment.be  This test is not yet approved or cleared by the [place] FDA and has been authorized for detection and/or diagnosis of SARS-CoV-2 by FDA under an Emergency Use Authorization (EUA). This EUA will remain in effect (meaning this test can be used) for the duration of the COVID-19 declaration under Section 564(b)(1) of the Act, 21 U.S.C. section 360bbb-3(b)(1), unless the authorization is terminated or revoked.  Performed at [facility], [place] [address], [place], [place] [address]   Troponin I (High Sensitivity)     Status: Abnormal   Collection Time: 05/02/21  1:51 AM  Result Value Ref Range   Troponin I (High Sensitivity) 55 (H) <18 ng/L    Comment: (NOTE) Elevated  high sensitivity troponin I (hsTnI) values and significant  changes across serial measurements may suggest ACS but many other  chronic and acute conditions are known to elevate hsTnI results.  Refer to the "Links" section for chest pain algorithms and additional  guidance. Performed at [facility], [place] [address] [place], [place] [address]   Brain natriuretic peptide     Status: Abnormal   Collection Time: 05/02/21  1:52 AM  Result Value Ref Range   B Natriuretic Peptide 692.7 (H) 0.0 - 100.0 pg/mL    Comment: Performed at [facility] [address] [place], [place] [address]  Hemoglobin A1c     Status: Abnormal   Collection Time: 05/02/21  1:53 AM  Result Value Ref Range   Hgb A1c MFr Bld 6.0 (H) 4.8 - 5.6 %    Comment: (NOTE) Pre diabetes:          5.7%-6.4%  Diabetes:              >6.4%  Glycemic control for   <7.0% adults with diabetes    Mean Plasma Glucose 125.5 mg/dL    Comment: Performed at [facility] [address] [place], [place] [address]  TSH     Status: None   Collection Time: 05/02/21  1:53 AM  Result Value Ref Range   TSH 3.727 0.350 - 4.500 uIU/mL    Comment: Performed by a 3rd Generation assay with a functional sensitivity of <=0.01 uIU/mL. Performed at [facility], [place] [address] [place], [place] [address]   Troponin I (High Sensitivity)     Status: Abnormal   Collection Time: 05/02/21  2:00 AM  Result Value Ref Range   Troponin I (High Sensitivity) 57 (H) <18 ng/L    Comment: (NOTE) Elevated high sensitivity troponin I (hsTnI) values and significant  changes across serial measurements may suggest ACS but many other  chronic and acute conditions are known to elevate hsTnI results.  Refer to the "Links" section for chest  pain algorithms and additional  guidance. Performed at [facility], [place] [address] [place], [place] [address]   Procalcitonin - Baseline     Status: None   Collection Time: 05/02/21  2:00 AM   Result Value Ref Range   Procalcitonin <0.10 ng/mL    Comment:        Interpretation: PCT (Procalcitonin) <= 0.5 ng/mL: Systemic infection (sepsis) is not likely. Local bacterial infection is possible. (NOTE)       Sepsis PCT Algorithm           Lower Respiratory Tract                                      Infection PCT Algorithm    ----------------------------     ----------------------------         PCT < 0.25 ng/mL                PCT < 0.10 ng/mL          Strongly encourage             Strongly discourage   discontinuation of antibiotics    initiation of antibiotics    ----------------------------     -----------------------------       PCT 0.25 - 0.50 ng/mL            PCT 0.10 - 0.25 ng/mL               OR       >80% decrease in PCT            Discourage initiation of                                            antibiotics      Encourage discontinuation           of antibiotics    ----------------------------     -----------------------------         PCT >= 0.50 ng/mL              PCT 0.26 - 0.50 ng/mL               AND        <80% decrease in PCT             Encourage initiation of                                             antibiotics       Encourage continuation           of antibiotics    ----------------------------     -----------------------------        PCT >= 0.50 ng/mL                  PCT > 0.50 ng/mL               AND         increase in PCT                  Strongly encourage  initiation of antibiotics    Strongly encourage escalation           of antibiotics                                     -----------------------------                                           PCT <= 0.25 ng/mL                                                 OR                                        > 80% decrease in PCT                                      Discontinue / Do not initiate                                              antibiotics  Performed at [facility], [address] [place], [place] [address]   CBG monitoring, ED     Status: Abnormal   Collection Time: 05/02/21  9:42 AM  Result Value Ref Range   Glucose-Capillary 106 (H) 70 - 99 mg/dL    Comment: Glucose reference range applies only to samples taken after fasting for at least 8 hours.  CBG monitoring, ED     Status: Abnormal   Collection Time: 05/02/21 11:42 AM  Result Value Ref Range   Glucose-Capillary 116 (H) 70 - 99 mg/dL    Comment: Glucose reference range applies only to samples taken after fasting for at least 8 hours.   Comment 1 Notify RN    Comment 2 Document in Chart   MRSA PCR Screening     Status: None   Collection Time: 05/02/21  2:56 PM   Specimen: Nasopharyngeal  Result Value Ref Range   MRSA by PCR NEGATIVE NEGATIVE    Comment:        The GeneXpert MRSA Assay (FDA approved for NASAL specimens only), is one component of a comprehensive MRSA colonization surveillance program. It is not intended to diagnose MRSA infection nor to guide or monitor treatment for MRSA infections. Performed at [facility], [place] [address] [place], [place] [address]   Glucose, capillary     Status: Abnormal   Collection Time: 05/02/21  4:39 PM  Result Value Ref Range   Glucose-Capillary 183 (H) 70 - 99 mg/dL    Comment: Glucose reference range applies only to samples taken after fasting for at least 8 hours.  Glucose, capillary     Status: Abnormal   Collection Time: 05/02/21  8:52 PM  Result Value Ref Range   Glucose-Capillary 142 (H) 70 - 99 mg/dL    Comment: Glucose reference range  applies only to samples taken after fasting for at least 8 hours.  Glucose, capillary     Status: Abnormal   Collection Time: 05/03/21  6:36 AM  Result Value Ref Range   Glucose-Capillary 104 (H) 70 - 99 mg/dL    Comment: Glucose reference range applies only to samples taken after fasting for at least 8 hours.   Comment 1 Notify RN   Basic  metabolic panel     Status: Abnormal   Collection Time: 05/03/21  7:00 AM  Result Value Ref Range   Sodium 142 135 - 145 mmol/L   Potassium 3.5 3.5 - 5.1 mmol/L   Chloride 106 98 - 111 mmol/L   CO2 30 22 - 32 mmol/L   Glucose, Bld 119 (H) 70 - 99 mg/dL    Comment: Glucose reference range applies only to samples taken after fasting for at least 8 hours.   BUN 16 8 - 23 mg/dL   Creatinine, Ser 1.48 (H) 0.61 - 1.24 mg/dL   Calcium 8.8 (L) 8.9 - 10.3 mg/dL   GFR, Estimated 48 (L) >60 mL/min    Comment: (NOTE) Calculated using the CKD-EPI Creatinine Equation (2021)    Anion gap 6 5 - 15    Comment: Performed at [facility] [address] [place], [place] [address]    ECG   N/A  Telemetry   Atrial flutter with 5:1 conduction - Personally Reviewed  Radiology    DG Chest 1 View  Result Date: 05/01/2021 CLINICAL DATA:  Shortness of breath. EXAM: CHEST  1 VIEW COMPARISON:  March 14, 2021 FINDINGS: Stable cardiomediastinal silhouette. Sternotomy wires are noted. No pneumothorax is noted. Right internal jugular Port-A-Cath is unchanged in position. Mild bibasilar atelectasis or infiltrates are noted. Small pleural effusions may be present. Bony thorax is unremarkable. IMPRESSION: Mild bibasilar subsegmental atelectasis or infiltrates. Possible small pleural effusions. Electronically Signed   By: Marijo Conception M.D.   On: 05/01/2021 17:06    Cardiac Studies   N/A  Assessment   1. Principal Problem: 2.   CHF (congestive heart failure) ([facility]) 3. Active Problems: 4.   Hyperlipidemia 5.   CAD S/P percutaneous coronary angioplasty 6.   Type 1 diabetes mellitus with nephropathy ([facility]) 7.   Thrombocytopenia ([facility]) 8.   Obstructive sleep apnea 9.   Chronic diastolic congestive heart failure ([place]) 10.   Chronic kidney disease (CKD), stage III (moderate) ([facility]) 11.   S/P CABG x 2 12.   Essential hypertension 13.   Adenocarcinoma of right lung, stage 4 ([facility]) 14.   CAP (community  acquired pneumonia) 74.   Acute on chronic combined systolic and diastolic CHF (congestive heart failure) ([facility]) 16.   Plan   1. Randall Wilkins is feeling better today with improvement in his shortness of breath after more than 1.5 L of diuresis.  Creatinine had been up to 1.54 but is coming down.  Troponins were minimally elevated.  BNP is elevated consistent with heart failure.  I suspect although his weights have been stable at home that he has been losing body weight but has gained fluid weight back.  Of note his systolic murmur is much louder than appreciated last in the office.  He had only mild aortic stenosis.  I would like to get a repeat limited echo to further assess this. Continue IV diuresis.  Time Spent Directly with Patient:  I have spent a total of 25 minutes with the patient reviewing hospital notes, telemetry, EKGs, labs and  examining the patient as well as establishing an assessment and plan that was discussed personally with the patient.  > 50% of time was spent in direct patient care.  Length of Stay:  LOS: 1 day   Pixie Casino, MD, [facility], [place] Director of the Advanced Lipid Disorders &  Cardiovascular Risk Reduction Clinic Diplomate of the American Board of Clinical Lipidology Attending Cardiologist  Direct Dial: [phone]  Fax: [phone]  Website:  [email or web address] 05/03/2021, 1:18 PM

## 2021-05-03 NOTE — Plan of Care (Signed)
  Problem: Education: Goal: Knowledge of General Education information will improve Description: Including pain rating scale, medication(s)/side effects and non-pharmacologic comfort measures Outcome: Progressing   Problem: Clinical Measurements: Goal: Ability to maintain clinical measurements within normal limits will improve Outcome: Progressing   Problem: Clinical Measurements: Goal: Cardiovascular complication will be avoided Outcome: Progressing   Problem: Nutrition: Goal: Adequate nutrition will be maintained Outcome: Progressing   Problem: Pain Managment: Goal: General experience of comfort will improve Outcome: Progressing   Problem: Skin Integrity: Goal: Risk for impaired skin integrity will decrease Outcome: Progressing

## 2021-05-03 NOTE — Progress Notes (Signed)
PROGRESS NOTE    Randall Wilkins  MGQ:[id number] DOB: 07-Jun-1941 DOA: 05/01/2021 PCP: [name], MD  Brief Narrative: 80 y.o. male with history of chronic systolic heart failure, paroxysmal atrial fibrillation, CAD s/p CABG, hypertension, diabetes mellitus and non-small cell lung cancer stage IV being followed by Dr. Julien Nordmann oncologist presents to the ER because of increasing shortness of breath over the last 48 hours.  Patient shortness of breath is increased on exertion and on lying down.  Has had some cough with some sputum production.  Denies fever chills chest pain.  Has not noticed any gaining weight.  ED Course: In the ER patient was in A. fib rate controlled.  Chest x-ray shows possible fluid effusion and possible infiltrate.  Labs show pancytopenia with creatinine 1.5 glucose 248 and COVID test were negative.  Blood cultures were obtained patient was started on empiric antibiotics for possible pneumonia and admitted for further management.  On my exam patient looks mildly short of breath and has features concerning for CHF and I placed patient on Lasix.   Assessment & Plan:   Principal Problem:   CHF (congestive heart failure) (HCC) Active Problems:   Hyperlipidemia   CAD S/P percutaneous coronary angioplasty   Type 1 diabetes mellitus with nephropathy (HCC)   Thrombocytopenia (HCC)   Obstructive sleep apnea   Chronic diastolic congestive heart failure (HCC)   Chronic kidney disease (CKD), stage III (moderate) (HCC)   S/P CABG x 2   Essential hypertension   Adenocarcinoma of right lung, stage 4 (HCC)   CAP (community acquired pneumonia)   Acute on chronic combined systolic and diastolic CHF (congestive heart failure) ([place])  #1 acute on chronic combined diastolic and systolic heart failure-he reports improvement with diuresis. Negative by 1.3 L Continue diuresis and monitor renal functions On BiDil, lisinopril Echo ordered for increased density of the murmur  #2 CKD  stage IIIa creatinine trending down to 1.48 from 1.54  #3 pancytopenia likely secondary to immunotherapy for malignancy.  #4 history of type 2 diabetes-on ssi and Lantus CBG (last 3)  Recent Labs    05/02/21 2052 05/03/21 0636 05/03/21 1357  GLUCAP 142* 104* 197*     #5 history of CAD and CABG continue current meds  #6 chronic atrial fibrillation on Eliquis  #7 stage IV adenocarcinoma lung CA followed by Dr. Julien Nordmann  #8 history of sleep apnea on CPAP at home  #9 history of hypothyroidism on Synthroid  #10 history of gout on allopurinol  #11 history of hypertension on lisinopril and Norvasc  #12 history of BPH on Flomax  #13 history of hyperlipidemia on Crestor    Estimated body mass index is 34.94 kg/m as calculated from the following:   Height as of 04/10/21: 5\' 7"  (1.702 m).   Weight as of this encounter: 101.2 kg.  DVT prophylaxis: Eliquis  code Status: Full code Family Communication: Discussed with wife at bedside  disposition Plan:  Status is: Inpatient  Dispo: The patient is from: Home              Anticipated d/c is to: Home              Patient currently is not medically stable to d/c.   Difficult to place patient No   Consultants: Cardiology  Procedures: None Antimicrobials: None  Subjective: He is resting in bed he just woke up from sleep his wife is by the bedside he appears a little startled just getting up from sleep asking to  sit up by the side of the bed wife and patient reports his breathing is better  Objective: Vitals:   05/03/21 0400 05/03/21 0634 05/03/21 0745 05/03/21 1058  BP: 139/64  135/60 (!) 160/70  Pulse:   (!) 49 (!) 48  Resp:   20 19  Temp:   97.8 F (36.6 C) (!) 97.4 F (36.3 C)  TempSrc:   Oral Axillary  SpO2:   99% 97%  Weight:  101.2 kg      Intake/Output Summary (Last 24 hours) at 05/03/2021 1457 Last data filed at 05/03/2021 1400 Gross per 24 hour  Intake 1090 ml  Output 2425 ml  Net -1335 ml   Filed  Weights   05/03/21 0634  Weight: 101.2 kg    Examination:  General exam: Appears calm and comfortable  Respiratory system diminished at the bases to auscultation. Respiratory effort normal. Cardiovascular system: S1 & S2 heard, RRR. No JVD, murmurs, rubs, gallops or clicks. No pedal edema. Gastrointestinal system: Abdomen is nondistended, soft and nontender. No organomegaly or masses felt. Normal bowel sounds heard. Central nervous system: Alert and oriented. No focal neurological deficits. Extremities: 1+ edema. Skin: No rashes, lesions or ulcers Psychiatry: Judgement and insight appear normal. Mood & affect appropriate.     Data Reviewed: I have personally reviewed following labs and imaging studies  CBC: Recent Labs  Lab 05/01/21 1618  WBC 3.5*  NEUTROABS 2.2  HGB 11.0*  HCT 35.9*  MCV 95.2  PLT 315*   Basic Metabolic Panel: Recent Labs  Lab 05/01/21 1618 05/03/21 0700  NA 139 142  K 4.5 3.5  CL 105 106  CO2 26 30  GLUCOSE 248* 119*  BUN 20 16  CREATININE 1.54* 1.48*  CALCIUM 9.0 8.8*   GFR: Estimated Creatinine Clearance: 45.1 mL/min (A) (by C-G formula based on SCr of 1.48 mg/dL (H)). Liver Function Tests: Recent Labs  Lab 05/01/21 1618  AST 20  ALT 11  ALKPHOS 72  BILITOT 1.0  PROT 6.3*  ALBUMIN 3.6   No results for input(s): LIPASE, AMYLASE in the last 168 hours. No results for input(s): AMMONIA in the last 168 hours. Coagulation Profile: No results for input(s): INR, PROTIME in the last 168 hours. Cardiac Enzymes: No results for input(s): CKTOTAL, CKMB, CKMBINDEX, TROPONINI in the last 168 hours. BNP (last 3 results) No results for input(s): PROBNP in the last 8760 hours. HbA1C: Recent Labs    05/02/21 0153  HGBA1C 6.0*   CBG: Recent Labs  Lab 05/02/21 1142 05/02/21 1639 05/02/21 2052 05/03/21 0636 05/03/21 1357  GLUCAP 116* 183* 142* 104* 197*   Lipid Profile: No results for input(s): CHOL, HDL, LDLCALC, TRIG, CHOLHDL,  LDLDIRECT in the last 72 hours. Thyroid Function Tests: Recent Labs    05/02/21 0153  TSH 3.727   Anemia Panel: No results for input(s): VITAMINB12, FOLATE, FERRITIN, TIBC, IRON, RETICCTPCT in the last 72 hours. Sepsis Labs: Recent Labs  Lab 05/02/21 0200  PROCALCITON <0.10    Recent Results (from the past 240 hour(s))  Resp Panel by RT-PCR (Flu A&B, Covid) Nasopharyngeal Swab     Status: None   Collection Time: 05/01/21  4:21 PM   Specimen: Nasopharyngeal Swab; Nasopharyngeal(NP) swabs in vial transport medium  Result Value Ref Range Status   SARS Coronavirus 2 by RT PCR NEGATIVE NEGATIVE Final    Comment: (NOTE) SARS-CoV-2 target nucleic acids are NOT DETECTED.  The SARS-CoV-2 RNA is generally detectable in upper respiratory specimens during the acute phase of infection.  The lowest concentration of SARS-CoV-2 viral copies this assay can detect is 138 copies/mL. A negative result does not preclude SARS-Cov-2 infection and should not be used as the sole basis for treatment or other patient management decisions. A negative result may occur with  improper specimen collection/handling, submission of specimen other than nasopharyngeal swab, presence of viral mutation(s) within the areas targeted by this assay, and inadequate number of viral copies(<138 copies/mL). A negative result must be combined with clinical observations, patient history, and epidemiological information. The expected result is Negative.  Fact Sheet for Patients:  EntrepreneurPulse.com.au  Fact Sheet for Healthcare Providers:  IncredibleEmployment.be  This test is no t yet approved or cleared by the [place] FDA and  has been authorized for detection and/or diagnosis of SARS-CoV-2 by FDA under an Emergency Use Authorization (EUA). This EUA will remain  in effect (meaning this test can be used) for the duration of the COVID-19 declaration under Section 564(b)(1) of  the Act, 21 U.S.C.section 360bbb-3(b)(1), unless the authorization is terminated  or revoked sooner.       Influenza A by PCR NEGATIVE NEGATIVE Final   Influenza B by PCR NEGATIVE NEGATIVE Final    Comment: (NOTE) The Xpert Xpress SARS-CoV-2/FLU/RSV plus assay is intended as an aid in the diagnosis of influenza from Nasopharyngeal swab specimens and should not be used as a sole basis for treatment. Nasal washings and aspirates are unacceptable for Xpert Xpress SARS-CoV-2/FLU/RSV testing.  Fact Sheet for Patients: EntrepreneurPulse.com.au  Fact Sheet for Healthcare Providers: IncredibleEmployment.be  This test is not yet approved or cleared by the [place] FDA and has been authorized for detection and/or diagnosis of SARS-CoV-2 by FDA under an Emergency Use Authorization (EUA). This EUA will remain in effect (meaning this test can be used) for the duration of the COVID-19 declaration under Section 564(b)(1) of the Act, 21 U.S.C. section 360bbb-3(b)(1), unless the authorization is terminated or revoked.  Performed at [facility], [place] [address] [place], [place] [address]   MRSA PCR Screening     Status: None   Collection Time: 05/02/21  2:56 PM   Specimen: Nasopharyngeal  Result Value Ref Range Status   MRSA by PCR NEGATIVE NEGATIVE Final    Comment:        The GeneXpert MRSA Assay (FDA approved for NASAL specimens only), is one component of a comprehensive MRSA colonization surveillance program. It is not intended to diagnose MRSA infection nor to guide or monitor treatment for MRSA infections. Performed at [facility], [place] [address], [place], [place] [address]          Radiology Studies: DG Chest 1 View  Result Date: 05/01/2021 CLINICAL DATA:  Shortness of breath. EXAM: CHEST  1 VIEW COMPARISON:  March 14, 2021 FINDINGS: Stable cardiomediastinal silhouette. Sternotomy wires are noted. No pneumothorax is  noted. Right internal jugular Port-A-Cath is unchanged in position. Mild bibasilar atelectasis or infiltrates are noted. Small pleural effusions may be present. Bony thorax is unremarkable. IMPRESSION: Mild bibasilar subsegmental atelectasis or infiltrates. Possible small pleural effusions. Electronically Signed   By: Marijo Conception M.D.   On: 05/01/2021 17:06        Scheduled Meds: . allopurinol  100 mg Oral Daily  . amitriptyline  75 mg Oral QHS  . amLODipine  10 mg Oral Daily  . apixaban  5 mg Oral BID  . Chlorhexidine Gluconate Cloth  6 each Topical Daily  . furosemide  60 mg Intravenous BID  .  insulin aspart  0-9 Units Subcutaneous TID WC  . insulin glargine  12 Units Subcutaneous QHS  . isosorbide-hydrALAZINE  1 tablet Oral BID  . ketorolac  1 drop Right Eye TID AC & HS  . levothyroxine  75 mcg Oral QAC breakfast  . lisinopril  10 mg Oral Daily  . potassium chloride SA  20 mEq Oral BID  . rosuvastatin  20 mg Oral Daily  . sodium chloride flush  10-40 mL Intracatheter Q12H  . tamsulosin  0.4 mg Oral QHS  . umeclidinium bromide  1 puff Inhalation Daily  . vitamin B-12  1,000 mcg Oral QHS   Continuous Infusions:   LOS: 1 day    Georgette Shell, MD  05/03/2021, 2:57 PM

## 2021-05-03 NOTE — Discharge Instructions (Signed)

## 2021-05-04 ENCOUNTER — Inpatient Hospital Stay (HOSPITAL_COMMUNITY): Payer: Medicare Other

## 2021-05-04 DIAGNOSIS — I5031 Acute diastolic (congestive) heart failure: Secondary | ICD-10-CM | POA: Diagnosis not present

## 2021-05-04 DIAGNOSIS — C3491 Malignant neoplasm of unspecified part of right bronchus or lung: Secondary | ICD-10-CM | POA: Diagnosis not present

## 2021-05-04 DIAGNOSIS — I5021 Acute systolic (congestive) heart failure: Secondary | ICD-10-CM | POA: Diagnosis not present

## 2021-05-04 DIAGNOSIS — I350 Nonrheumatic aortic (valve) stenosis: Secondary | ICD-10-CM | POA: Diagnosis not present

## 2021-05-04 DIAGNOSIS — I483 Typical atrial flutter: Secondary | ICD-10-CM

## 2021-05-04 DIAGNOSIS — I5043 Acute on chronic combined systolic (congestive) and diastolic (congestive) heart failure: Secondary | ICD-10-CM | POA: Diagnosis not present

## 2021-05-04 DIAGNOSIS — N183 Chronic kidney disease, stage 3 unspecified: Secondary | ICD-10-CM | POA: Diagnosis not present

## 2021-05-04 DIAGNOSIS — Z951 Presence of aortocoronary bypass graft: Secondary | ICD-10-CM | POA: Diagnosis not present

## 2021-05-04 LAB — GLUCOSE, CAPILLARY
Glucose-Capillary: 114 mg/dL — ABNORMAL HIGH (ref 70–99)
Glucose-Capillary: 185 mg/dL — ABNORMAL HIGH (ref 70–99)
Glucose-Capillary: 164 mg/dL — ABNORMAL HIGH (ref 70–99)
Glucose-Capillary: 246 mg/dL — ABNORMAL HIGH (ref 70–99)

## 2021-05-04 LAB — ECHOCARDIOGRAM LIMITED
AR max vel: 1.1 cm2
AV Area VTI: 1.18 cm2
AV Area mean vel: 1.09 cm2
AV Mean grad: 17.5 mmHg
AV Peak grad: 31.8 mmHg
Ao pk vel: 2.82 m/s
S' Lateral: 3.8 cm
Weight: 3485.03 oz

## 2021-05-04 LAB — BASIC METABOLIC PANEL
Anion gap: 9 (ref 5–15)
BUN: 18 mg/dL (ref 8–23)
CO2: 30 mmol/L (ref 22–32)
Calcium: 8.8 mg/dL — ABNORMAL LOW (ref 8.9–10.3)
Chloride: 102 mmol/L (ref 98–111)
Creatinine, Ser: 1.51 mg/dL — ABNORMAL HIGH (ref 0.61–1.24)
GFR, Estimated: 46 mL/min — ABNORMAL LOW (ref >60)
Glucose, Bld: 122 mg/dL — ABNORMAL HIGH (ref 70–99)
Potassium: 3.5 mmol/L (ref 3.5–5.1)
Sodium: 141 mmol/L (ref 135–145)

## 2021-05-04 MED ORDER — LORAZEPAM 2 MG/ML IJ SOLN
0.5000 mg | Freq: Once | INTRAMUSCULAR | Status: AC
  Administered 2021-05-04: 0.5 mg via INTRAVENOUS
  Filled 2021-05-04: qty 1

## 2021-05-04 MED ORDER — FUROSEMIDE 40 MG PO TABS
60.0000 mg | ORAL_TABLET | Freq: Two times a day (BID) | ORAL | Status: DC
  Administered 2021-05-04 – 2021-05-06 (×4): 60 mg via ORAL
  Filled 2021-05-04 (×3): qty 1

## 2021-05-04 NOTE — Progress Notes (Signed)
PROGRESS NOTE    Randall Wilkins  BZJ:[id number] DOB: 12-28-1940 DOA: 05/01/2021 PCP: [name], MD  Brief Narrative: 80 y.o. male with history of chronic systolic heart failure, paroxysmal atrial fibrillation, CAD s/p CABG, hypertension, diabetes mellitus and non-small cell lung cancer stage IV being followed by Dr. Julien Nordmann oncologist presents to the ER because of increasing shortness of breath over the last 48 hours.  Patient shortness of breath is increased on exertion and on lying down.  Has had some cough with some sputum production.  Denies fever chills chest pain.  Has not noticed any gaining weight.  ED Course: In the ER patient was in A. fib rate controlled.  Chest x-ray shows possible fluid effusion and possible infiltrate.  Labs show pancytopenia with creatinine 1.5 glucose 248 and COVID test were negative.  Blood cultures were obtained patient was started on empiric antibiotics for possible pneumonia and admitted for further management.  On my exam patient looks mildly short of breath and has features concerning for CHF and I placed patient on Lasix.   Assessment & Plan:   Principal Problem:   CHF (congestive heart failure) (HCC) Active Problems:   Hyperlipidemia   CAD S/P percutaneous coronary angioplasty   Type 1 diabetes mellitus with nephropathy (HCC)   Thrombocytopenia (HCC)   Obstructive sleep apnea   Chronic diastolic congestive heart failure (HCC)   Chronic kidney disease (CKD), stage III (moderate) (HCC)   S/P CABG x 2   Essential hypertension   Adenocarcinoma of right lung, stage 4 (HCC)   CAP (community acquired pneumonia)   Acute on chronic combined systolic and diastolic CHF (congestive heart failure) (HCC)   Typical atrial flutter (HCC)  #1 acute on chronic combined diastolic and systolic heart failure-he reports improvement with diuresis. Negative by 1.3 L Continue diuresis and monitor renal functions Creatinine up some 1.51 from 1.48 Will follow.   Lasix changed to p.o.  Cardiology planning for possible cardioversion for persistent a flutter On BiDil, lisinopril Echo ordered for increased intensity of the murmur  #2 CKD stage IIIa creatinine trending down to 1.48 from 1.54  #3 pancytopenia likely secondary to immunotherapy for malignancy.  #4 history of type 2 diabetes-on ssi and Lantus CBG (last 3)  Recent Labs    05/03/21 2115 05/04/21 0637 05/04/21 1100  GLUCAP 141* 114* 185*     #5 history of CAD and CABG continue current meds as above.  #6 chronic atrial fibrillation on Eliquis  #7 stage IV adenocarcinoma lung CA followed by Dr. Julien Nordmann  #8 history of sleep apnea on CPAP at home  #9 history of hypothyroidism on Synthroid  #10 history of gout on allopurinol  #11 history of hypertension on lisinopril and Norvasc  #12 history of BPH on Flomax  #13 history of hyperlipidemia on Crestor    Estimated body mass index is 34.11 kg/m as calculated from the following:   Height as of 04/10/21: 5\' 7"  (1.702 m).   Weight as of this encounter: 98.8 kg.  DVT prophylaxis: Eliquis  code Status: Full code Family Communication: Discussed with wife at bedside  disposition Plan:  Status is: Inpatient  Dispo: The patient is from: Home              Anticipated d/c is to: Home              Patient currently is not medically stable to d/c.   Difficult to place patient No   Consultants: Cardiology  Procedures: None Antimicrobials: None  Subjective: He is resting in bed much more awake and alert than yesterday.  Wife by the bedside.  Overnight he was trying to pull out IVs and pulled and had mittens placed for safety. His wife reports that every time he is in the hospital he behaves like this.  He is awake alert able to answer my questions appropriately.  He feels that he is getting better and he is better breathing is better denies any chest pain. Objective: Vitals:   05/03/21 2025 05/03/21 2323 05/04/21 0222 05/04/21  0739  BP: (!) 127/54 123/75 (!) 143/63 (!) 160/65  Pulse: (!) 53 63 61 68  Resp: 16 16 13  (!) 23  Temp: 98.1 F (36.7 C) 98.4 F (36.9 C) 98.4 F (36.9 C) 98.5 F (36.9 C)  TempSrc: Oral Oral  Oral  SpO2: 100% 95% 98% 97%  Weight:   98.8 kg     Intake/Output Summary (Last 24 hours) at 05/04/2021 1129 Last data filed at 05/04/2021 0800 Gross per 24 hour  Intake 960 ml  Output 850 ml  Net 110 ml   Filed Weights   05/03/21 0634 05/04/21 0222  Weight: 101.2 kg 98.8 kg    Examination:  General exam: Appears calm and comfortable  Respiratory system diminished at the bases to auscultation. Respiratory effort normal. Cardiovascular system: S1 & S2 heard, RRR. No JVD, systolic murmurs, rubs, gallops or clicks. Gastrointestinal system: Abdomen is nondistended, soft and nontender. No organomegaly or masses felt. Normal bowel sounds heard. Central nervous system: Alert and oriented. No focal neurological deficits. Extremities: 2+ pitting edema bilateral lower extremity Skin: No rashes, lesions or ulcers Psychiatry: Judgement and insight appear normal. Mood & affect appropriate.     Data Reviewed: I have personally reviewed following labs and imaging studies  CBC: Recent Labs  Lab 05/01/21 1618  WBC 3.5*  NEUTROABS 2.2  HGB 11.0*  HCT 35.9*  MCV 95.2  PLT 527*   Basic Metabolic Panel: Recent Labs  Lab 05/01/21 1618 05/03/21 0700 05/04/21 0237  NA 139 142 141  K 4.5 3.5 3.5  CL 105 106 102  CO2 26 30 30   GLUCOSE 248* 119* 122*  BUN 20 16 18   CREATININE 1.54* 1.48* 1.51*  CALCIUM 9.0 8.8* 8.8*   GFR: Estimated Creatinine Clearance: 43.7 mL/min (A) (by C-G formula based on SCr of 1.51 mg/dL (H)). Liver Function Tests: Recent Labs  Lab 05/01/21 1618  AST 20  ALT 11  ALKPHOS 72  BILITOT 1.0  PROT 6.3*  ALBUMIN 3.6   No results for input(s): LIPASE, AMYLASE in the last 168 hours. No results for input(s): AMMONIA in the last 168 hours. Coagulation  Profile: No results for input(s): INR, PROTIME in the last 168 hours. Cardiac Enzymes: No results for input(s): CKTOTAL, CKMB, CKMBINDEX, TROPONINI in the last 168 hours. BNP (last 3 results) No results for input(s): PROBNP in the last 8760 hours. HbA1C: Recent Labs    05/02/21 0153  HGBA1C 6.0*   CBG: Recent Labs  Lab 05/03/21 1357 05/03/21 1624 05/03/21 2115 05/04/21 0637 05/04/21 1100  GLUCAP 197* 167* 141* 114* 185*   Lipid Profile: No results for input(s): CHOL, HDL, LDLCALC, TRIG, CHOLHDL, LDLDIRECT in the last 72 hours. Thyroid Function Tests: Recent Labs    05/02/21 0153  TSH 3.727   Anemia Panel: No results for input(s): VITAMINB12, FOLATE, FERRITIN, TIBC, IRON, RETICCTPCT in the last 72 hours. Sepsis Labs: Recent Labs  Lab 05/02/21 0200  PROCALCITON <0.10    Recent Results (  from the past 240 hour(s))  Resp Panel by RT-PCR (Flu A&B, Covid) Nasopharyngeal Swab     Status: None   Collection Time: 05/01/21  4:21 PM   Specimen: Nasopharyngeal Swab; Nasopharyngeal(NP) swabs in vial transport medium  Result Value Ref Range Status   SARS Coronavirus 2 by RT PCR NEGATIVE NEGATIVE Final    Comment: (NOTE) SARS-CoV-2 target nucleic acids are NOT DETECTED.  The SARS-CoV-2 RNA is generally detectable in upper respiratory specimens during the acute phase of infection. The lowest concentration of SARS-CoV-2 viral copies this assay can detect is 138 copies/mL. A negative result does not preclude SARS-Cov-2 infection and should not be used as the sole basis for treatment or other patient management decisions. A negative result may occur with  improper specimen collection/handling, submission of specimen other than nasopharyngeal swab, presence of viral mutation(s) within the areas targeted by this assay, and inadequate number of viral copies(<138 copies/mL). A negative result must be combined with clinical observations, patient history, and  epidemiological information. The expected result is Negative.  Fact Sheet for Patients:  EntrepreneurPulse.com.au  Fact Sheet for Healthcare Providers:  IncredibleEmployment.be  This test is no t yet approved or cleared by the [place] FDA and  has been authorized for detection and/or diagnosis of SARS-CoV-2 by FDA under an Emergency Use Authorization (EUA). This EUA will remain  in effect (meaning this test can be used) for the duration of the COVID-19 declaration under Section 564(b)(1) of the Act, 21 U.S.C.section 360bbb-3(b)(1), unless the authorization is terminated  or revoked sooner.       Influenza A by PCR NEGATIVE NEGATIVE Final   Influenza B by PCR NEGATIVE NEGATIVE Final    Comment: (NOTE) The Xpert Xpress SARS-CoV-2/FLU/RSV plus assay is intended as an aid in the diagnosis of influenza from Nasopharyngeal swab specimens and should not be used as a sole basis for treatment. Nasal washings and aspirates are unacceptable for Xpert Xpress SARS-CoV-2/FLU/RSV testing.  Fact Sheet for Patients: EntrepreneurPulse.com.au  Fact Sheet for Healthcare Providers: IncredibleEmployment.be  This test is not yet approved or cleared by the [place] FDA and has been authorized for detection and/or diagnosis of SARS-CoV-2 by FDA under an Emergency Use Authorization (EUA). This EUA will remain in effect (meaning this test can be used) for the duration of the COVID-19 declaration under Section 564(b)(1) of the Act, 21 U.S.C. section 360bbb-3(b)(1), unless the authorization is terminated or revoked.  Performed at [facility], [place] [address] [place], [place] [address]   Blood culture (routine x 2)     Status: None (Preliminary result)   Collection Time: 05/01/21 10:50 PM   Specimen: BLOOD  Result Value Ref Range Status   Specimen Description BLOOD LEFT ANTECUBITAL  Final   Special Requests    Final    BOTTLES DRAWN AEROBIC AND ANAEROBIC Blood Culture adequate volume   Culture   Final    NO GROWTH 2 DAYS Performed at [facility], [place] [address], [place], [place] [address]    Report Status PENDING  Incomplete  MRSA PCR Screening     Status: None   Collection Time: 05/02/21  2:56 PM   Specimen: Nasopharyngeal  Result Value Ref Range Status   MRSA by PCR NEGATIVE NEGATIVE Final    Comment:        The GeneXpert MRSA Assay (FDA approved for NASAL specimens only), is one component of a comprehensive MRSA colonization surveillance program. It is not intended to diagnose MRSA infection  nor to guide or monitor treatment for MRSA infections. Performed at [facility], [place] [address] [place], [place] [address]          Radiology Studies: No results found.      Scheduled Meds: . allopurinol  100 mg Oral Daily  . amitriptyline  75 mg Oral QHS  . amLODipine  10 mg Oral Daily  . apixaban  5 mg Oral BID  . Chlorhexidine Gluconate Cloth  6 each Topical Daily  . furosemide  60 mg Oral BID  . insulin aspart  0-9 Units Subcutaneous TID WC  . insulin glargine  12 Units Subcutaneous QHS  . isosorbide-hydrALAZINE  1 tablet Oral BID  . ketorolac  1 drop Right Eye TID AC & HS  . levothyroxine  75 mcg Oral QAC breakfast  . lisinopril  10 mg Oral Daily  . potassium chloride SA  20 mEq Oral BID  . rosuvastatin  20 mg Oral Daily  . sodium chloride flush  10-40 mL Intracatheter Q12H  . tamsulosin  0.4 mg Oral QHS  . umeclidinium bromide  1 puff Inhalation Daily  . vitamin B-12  1,000 mcg Oral QHS   Continuous Infusions:   LOS: 2 days    Georgette Shell, MD  05/04/2021, 11:29 AM

## 2021-05-04 NOTE — Plan of Care (Signed)
  Problem: Education: Goal: Knowledge of General Education information will improve Description: Including pain rating scale, medication(s)/side effects and non-pharmacologic comfort measures Outcome: Progressing   Problem: Clinical Measurements: Goal: Ability to maintain clinical measurements within normal limits will improve Outcome: Progressing   Problem: Clinical Measurements: Goal: Diagnostic test results will improve Outcome: Progressing   Problem: Clinical Measurements: Goal: Respiratory complications will improve Outcome: Progressing   Problem: Activity: Goal: Risk for activity intolerance will decrease Outcome: Progressing   Problem: Nutrition: Goal: Adequate nutrition will be maintained Outcome: Progressing   Problem: Pain Managment: Goal: General experience of comfort will improve Outcome: Progressing   Problem: Skin Integrity: Goal: Risk for impaired skin integrity will decrease Outcome: Progressing

## 2021-05-04 NOTE — Progress Notes (Signed)
  Echocardiogram 2D Echocardiogram has been performed.  Randall Wilkins 05/04/2021, 9:57 AM

## 2021-05-04 NOTE — Progress Notes (Signed)
DAILY PROGRESS NOTE   Patient Name: Randall Wilkins Date of Encounter: 05/04/2021 Cardiologist: Pixie Casino, MD  Chief Complaint   No complaints  Patient Profile   Randall Wilkins is a 80 y.o. male with a hx of chronic respiratory failure on home O2, hypertension, hyperlipidemia, IDDM, venous insufficiency, OSA not on CPAP therapy, CAD s/p CABG 2016, persistent atrial fibrillation, history of Mobitz 1 heart block, and stage IV non-small cell lung cancer who is being seen 05/02/2021 for the evaluation of dyspnea at the request of Dr. Hal Hope.   Subjective   Diuresed another 690 cc negative, now 1.3L Negative. Creatinine stable around 1.5. Weight down another 3Kg since yesterday.  Objective   Vitals:   05/03/21 1625 05/03/21 2025 05/03/21 2323 05/04/21 0222  BP: 136/63 (!) 127/54 123/75 (!) 143/63  Pulse: (!) 49 (!) 53 63 61  Resp: 20 16 16 13   Temp: 97.6 F (36.4 C) 98.1 F (36.7 C) 98.4 F (36.9 C) 98.4 F (36.9 C)  TempSrc: Oral Oral Oral   SpO2: 99% 100% 95% 98%  Weight:    98.8 kg    Intake/Output Summary (Last 24 hours) at 05/04/2021 0816 Last data filed at 05/04/2021 0222 Gross per 24 hour  Intake 720 ml  Output 1650 ml  Net -930 ml   Filed Weights   05/03/21 0634 05/04/21 0222  Weight: 101.2 kg 98.8 kg    Physical Exam   General appearance: alert, no distress and pale Neck: JVD - 2 cm above sternal notch, no carotid bruit and thyroid not enlarged, symmetric, no tenderness/mass/nodules Lungs: diminished breath sounds bilaterally Heart: regular rate and rhythm, S1, S2 normal and systolic murmur: systolic ejection 3/6, blowing at 2nd right intercostal space Abdomen: soft, non-tender; bowel sounds normal; no masses,  no organomegaly Extremities: extremities normal, atraumatic, no cyanosis or edema Pulses: 2+ and symmetric Skin: Pale, warm, dry Neurologic: Grossly normal Psych: Pleasant, quiet, very hard of hearing  Inpatient Medications     Scheduled Meds: . allopurinol  100 mg Oral Daily  . amitriptyline  75 mg Oral QHS  . amLODipine  10 mg Oral Daily  . apixaban  5 mg Oral BID  . Chlorhexidine Gluconate Cloth  6 each Topical Daily  . furosemide  60 mg Intravenous BID  . insulin aspart  0-9 Units Subcutaneous TID WC  . insulin glargine  12 Units Subcutaneous QHS  . isosorbide-hydrALAZINE  1 tablet Oral BID  . ketorolac  1 drop Right Eye TID AC & HS  . levothyroxine  75 mcg Oral QAC breakfast  . lisinopril  10 mg Oral Daily  . potassium chloride SA  20 mEq Oral BID  . rosuvastatin  20 mg Oral Daily  . sodium chloride flush  10-40 mL Intracatheter Q12H  . tamsulosin  0.4 mg Oral QHS  . umeclidinium bromide  1 puff Inhalation Daily  . vitamin B-12  1,000 mcg Oral QHS    Continuous Infusions:   PRN Meds: acetaminophen **OR** acetaminophen, albuterol, sodium chloride flush   Labs   Results for orders placed or performed during the hospital encounter of 05/01/21 (from the past 48 hour(s))  CBG monitoring, ED     Status: Abnormal   Collection Time: 05/02/21  9:42 AM  Result Value Ref Range   Glucose-Capillary 106 (H) 70 - 99 mg/dL    Comment: Glucose reference range applies only to samples taken after fasting for at least 8 hours.  CBG monitoring, ED  Status: Abnormal   Collection Time: 05/02/21 11:42 AM  Result Value Ref Range   Glucose-Capillary 116 (H) 70 - 99 mg/dL    Comment: Glucose reference range applies only to samples taken after fasting for at least 8 hours.   Comment 1 Notify RN    Comment 2 Document in Chart   MRSA PCR Screening     Status: None   Collection Time: 05/02/21  2:56 PM   Specimen: Nasopharyngeal  Result Value Ref Range   MRSA by PCR NEGATIVE NEGATIVE    Comment:        The GeneXpert MRSA Assay (FDA approved for NASAL specimens only), is one component of a comprehensive MRSA colonization surveillance program. It is not intended to diagnose MRSA infection nor to guide  or monitor treatment for MRSA infections. Performed at [facility], [place] [address], [place], [place] [address]   Glucose, capillary     Status: Abnormal   Collection Time: 05/02/21  4:39 PM  Result Value Ref Range   Glucose-Capillary 183 (H) 70 - 99 mg/dL    Comment: Glucose reference range applies only to samples taken after fasting for at least 8 hours.  Glucose, capillary     Status: Abnormal   Collection Time: 05/02/21  8:52 PM  Result Value Ref Range   Glucose-Capillary 142 (H) 70 - 99 mg/dL    Comment: Glucose reference range applies only to samples taken after fasting for at least 8 hours.  Glucose, capillary     Status: Abnormal   Collection Time: 05/03/21  6:36 AM  Result Value Ref Range   Glucose-Capillary 104 (H) 70 - 99 mg/dL    Comment: Glucose reference range applies only to samples taken after fasting for at least 8 hours.   Comment 1 Notify RN   Basic metabolic panel     Status: Abnormal   Collection Time: 05/03/21  7:00 AM  Result Value Ref Range   Sodium 142 135 - 145 mmol/L   Potassium 3.5 3.5 - 5.1 mmol/L   Chloride 106 98 - 111 mmol/L   CO2 30 22 - 32 mmol/L   Glucose, Bld 119 (H) 70 - 99 mg/dL    Comment: Glucose reference range applies only to samples taken after fasting for at least 8 hours.   BUN 16 8 - 23 mg/dL   Creatinine, Ser 1.48 (H) 0.61 - 1.24 mg/dL   Calcium 8.8 (L) 8.9 - 10.3 mg/dL   GFR, Estimated 48 (L) >60 mL/min    Comment: (NOTE) Calculated using the CKD-EPI Creatinine Equation (2021)    Anion gap 6 5 - 15    Comment: Performed at [facility] [address] [place], [place] [address]  Glucose, capillary     Status: Abnormal   Collection Time: 05/03/21  1:57 PM  Result Value Ref Range   Glucose-Capillary 197 (H) 70 - 99 mg/dL    Comment: Glucose reference range applies only to samples taken after fasting for at least 8 hours.  Glucose, capillary     Status: Abnormal   Collection Time: 05/03/21  4:24 PM  Result Value  Ref Range   Glucose-Capillary 167 (H) 70 - 99 mg/dL    Comment: Glucose reference range applies only to samples taken after fasting for at least 8 hours.  Glucose, capillary     Status: Abnormal   Collection Time: 05/03/21  9:15 PM  Result Value Ref Range   Glucose-Capillary 141 (H) 70 - 99 mg/dL  Comment: Glucose reference range applies only to samples taken after fasting for at least 8 hours.  Basic metabolic panel     Status: Abnormal   Collection Time: 05/04/21  2:37 AM  Result Value Ref Range   Sodium 141 135 - 145 mmol/L   Potassium 3.5 3.5 - 5.1 mmol/L   Chloride 102 98 - 111 mmol/L   CO2 30 22 - 32 mmol/L   Glucose, Bld 122 (H) 70 - 99 mg/dL    Comment: Glucose reference range applies only to samples taken after fasting for at least 8 hours.   BUN 18 8 - 23 mg/dL   Creatinine, Ser 1.51 (H) 0.61 - 1.24 mg/dL   Calcium 8.8 (L) 8.9 - 10.3 mg/dL   GFR, Estimated 46 (L) >60 mL/min    Comment: (NOTE) Calculated using the CKD-EPI Creatinine Equation (2021)    Anion gap 9 5 - 15    Comment: Performed at [facility] [address], [place], [place] [address]  Glucose, capillary     Status: Abnormal   Collection Time: 05/04/21  6:37 AM  Result Value Ref Range   Glucose-Capillary 114 (H) 70 - 99 mg/dL    Comment: Glucose reference range applies only to samples taken after fasting for at least 8 hours.    ECG   N/A  Telemetry   Atrial flutter with 5:1 conduction - Personally Reviewed  Radiology    No results found.  Cardiac Studies   N/A  Assessment   Principal Problem:   CHF (congestive heart failure) (HCC) Active Problems:   Hyperlipidemia   CAD S/P percutaneous coronary angioplasty   Type 1 diabetes mellitus with nephropathy (HCC)   Thrombocytopenia (HCC)   Obstructive sleep apnea   Chronic diastolic congestive heart failure (HCC)   Chronic kidney disease (CKD), stage III (moderate) (HCC)   S/P CABG x 2   Essential hypertension   Adenocarcinoma  of right lung, stage 4 (HCC)   CAP (community acquired pneumonia)   Acute on chronic combined systolic and diastolic CHF (congestive heart failure) (HCC)   Typical atrial flutter ([place])   Plan   1. Randall Wilkins is better compensated from a heart failure standpoint. Unfortunately, he has had ICU delerium and will require MITT restraints at night as he has been pulling out his port-IV access. Will switch to oral diuretics. As mentioned, murmur is much louder - will get limited echo today to further evalute. As he has been in persistent atrial flutter for several months and anticoagulated without interruption since he had an eye surgery in March, will likely pursue elective cardioversion this week to see if that helps his heart failure. Discussed with patient and his wife at bedside.  Time Spent Directly with Patient:  I have spent a total of 25 minutes with the patient reviewing hospital notes, telemetry, EKGs, labs and examining the patient as well as establishing an assessment and plan that was discussed personally with the patient.  > 50% of time was spent in direct patient care.  Length of Stay:  LOS: 2 days   Pixie Casino, MD, [facility], [place] Director of the Advanced Lipid Disorders &  Cardiovascular Risk Reduction Clinic Diplomate of the American Board of Clinical Lipidology Attending Cardiologist  Direct Dial: [phone]  Fax: [phone]  Website:  [email or web address] 05/04/2021, 8:16 AM

## 2021-05-04 NOTE — Progress Notes (Signed)
Patient sundowning and agitated. Only oriented to person. Declining to take nighttime medications. Attempting to hit staff and climb out of bed when cleaning him up. Mittens placed on patient. TRH Dr. Tonie Griffith paged. 0.5mg  Ativan ordered. Will continue to monitor the patient.

## 2021-05-04 NOTE — Progress Notes (Signed)
Mobility Specialist: Progress Note   05/04/21 1417  Mobility  Activity Ambulated in hall  Level of Assistance Minimal assist, patient does 75% or more  Assistive Device Front wheel walker  Distance Ambulated (ft) 112 ft  Mobility Ambulated with assistance in hallway  Mobility Response Tolerated well  Mobility performed by Mobility specialist  Bed Position Chair  $Mobility charge 1 Mobility   Pre-Mobility: 57 HR, 96% SpO2 Post-Mobility: 58 HR, 98% SpO2  Pt was minA to stand and contact guard during ambulation, asx during. Pt to chair after walk per request, call bell at his side and pt's wife present in room.   [facility] Randall Wilkins Mobility Specialist Mobility Specialist Phone: [phone]

## 2021-05-05 DIAGNOSIS — I5043 Acute on chronic combined systolic (congestive) and diastolic (congestive) heart failure: Secondary | ICD-10-CM | POA: Diagnosis not present

## 2021-05-05 DIAGNOSIS — C3491 Malignant neoplasm of unspecified part of right bronchus or lung: Secondary | ICD-10-CM | POA: Diagnosis not present

## 2021-05-05 DIAGNOSIS — I5021 Acute systolic (congestive) heart failure: Secondary | ICD-10-CM | POA: Diagnosis not present

## 2021-05-05 DIAGNOSIS — I483 Typical atrial flutter: Secondary | ICD-10-CM | POA: Diagnosis not present

## 2021-05-05 LAB — GLUCOSE, CAPILLARY
Glucose-Capillary: 125 mg/dL — ABNORMAL HIGH (ref 70–99)
Glucose-Capillary: 198 mg/dL — ABNORMAL HIGH (ref 70–99)
Glucose-Capillary: 221 mg/dL — ABNORMAL HIGH (ref 70–99)
Glucose-Capillary: 162 mg/dL — ABNORMAL HIGH (ref 70–99)

## 2021-05-05 LAB — PROTIME-INR
INR: 1.1 (ref 0.8–1.2)
Prothrombin Time: 14.6 seconds (ref 11.4–15.2)

## 2021-05-05 LAB — BASIC METABOLIC PANEL
Anion gap: 5 (ref 5–15)
BUN: 18 mg/dL (ref 8–23)
CO2: 30 mmol/L (ref 22–32)
Calcium: 8.7 mg/dL — ABNORMAL LOW (ref 8.9–10.3)
Chloride: 104 mmol/L (ref 98–111)
Creatinine, Ser: 1.58 mg/dL — ABNORMAL HIGH (ref 0.61–1.24)
GFR, Estimated: 44 mL/min — ABNORMAL LOW (ref >60)
Glucose, Bld: 142 mg/dL — ABNORMAL HIGH (ref 70–99)
Potassium: 3.4 mmol/L — ABNORMAL LOW (ref 3.5–5.1)
Sodium: 139 mmol/L (ref 135–145)

## 2021-05-05 LAB — CBC
HCT: 37.1 % — ABNORMAL LOW (ref 39.0–52.0)
Hemoglobin: 11.3 g/dL — ABNORMAL LOW (ref 13.0–17.0)
MCH: 28.5 pg (ref 26.0–34.0)
MCHC: 30.5 g/dL (ref 30.0–36.0)
MCV: 93.5 fL (ref 80.0–100.0)
Platelets: 107 10*3/uL — ABNORMAL LOW (ref 150–400)
RBC: 3.97 MIL/uL — ABNORMAL LOW (ref 4.22–5.81)
RDW: 15.9 % — ABNORMAL HIGH (ref 11.5–15.5)
WBC: 5.5 10*3/uL (ref 4.0–10.5)
nRBC: 0 % (ref 0.0–0.2)

## 2021-05-05 LAB — MAGNESIUM: Magnesium: 2 mg/dL (ref 1.7–2.4)

## 2021-05-05 MED ORDER — HALOPERIDOL LACTATE 5 MG/ML IJ SOLN
2.0000 mg | Freq: Once | INTRAMUSCULAR | Status: AC
  Administered 2021-05-05: 2 mg via INTRAMUSCULAR
  Filled 2021-05-05: qty 1

## 2021-05-05 MED ORDER — POTASSIUM CHLORIDE CRYS ER 20 MEQ PO TBCR
40.0000 meq | EXTENDED_RELEASE_TABLET | Freq: Two times a day (BID) | ORAL | Status: DC
  Administered 2021-05-05 – 2021-05-07 (×5): 40 meq via ORAL
  Filled 2021-05-05 (×5): qty 2

## 2021-05-05 NOTE — Progress Notes (Addendum)
DAILY PROGRESS NOTE   Patient Name: Randall Wilkins Date of Encounter: 05/05/2021 Cardiologist: Pixie Casino, MD  Chief Complaint   No complaints  Patient Profile   Randall Wilkins is a 80 y.o. male with a hx of chronic respiratory failure on home O2, hypertension, hyperlipidemia, IDDM, venous insufficiency, OSA not on CPAP therapy, CAD s/p CABG 2016, persistent atrial fibrillation, history of Mobitz 1 heart block, and stage IV non-small cell lung cancer who is being seen 05/02/2021 for the evaluation of dyspnea at the request of Dr. Hal Hope.   Subjective   About even yesterday - 1.2L total negative. Weight stable at 99kg. Remains in atrial flutter with 5:1 conduction - sundowning again overnight.  Objective   Vitals:   05/04/21 1950 05/04/21 2318 05/05/21 0334 05/05/21 0749  BP: 126/83 (!) 151/73 (!) 154/56   Pulse: 69 79 (!) 54   Resp: 18 20 20    Temp: 98.2 F (36.8 C) 97.6 F (36.4 C) 97.6 F (36.4 C)   TempSrc: Oral Axillary Axillary   SpO2: 96% 96% 94% 92%  Weight:   99 kg     Intake/Output Summary (Last 24 hours) at 05/05/2021 6546 Last data filed at 05/05/2021 5035 Gross per 24 hour  Intake 240 ml  Output 450 ml  Net -210 ml   Filed Weights   05/03/21 0634 05/04/21 0222 05/05/21 0334  Weight: 101.2 kg 98.8 kg 99 kg    Physical Exam   General appearance: alert, no distress and pale Neck: JVD - 1 cm above sternal notch, no carotid bruit and thyroid not enlarged, symmetric, no tenderness/mass/nodules Lungs: diminished breath sounds bilaterally Heart: regular rate and rhythm, S1, S2 normal and systolic murmur: systolic ejection 3/6, blowing at 2nd right intercostal space Abdomen: soft, non-tender; bowel sounds normal; no masses,  no organomegaly Extremities: extremities normal, atraumatic, no cyanosis or edema Pulses: 2+ and symmetric Skin: Pale, warm, dry Neurologic: Grossly normal Psych: Pleasant, oriented this morning  Inpatient Medications     Scheduled Meds: . allopurinol  100 mg Oral Daily  . amitriptyline  75 mg Oral QHS  . amLODipine  10 mg Oral Daily  . apixaban  5 mg Oral BID  . Chlorhexidine Gluconate Cloth  6 each Topical Daily  . furosemide  60 mg Oral BID  . insulin aspart  0-9 Units Subcutaneous TID WC  . insulin glargine  12 Units Subcutaneous QHS  . isosorbide-hydrALAZINE  1 tablet Oral BID  . ketorolac  1 drop Right Eye TID AC & HS  . levothyroxine  75 mcg Oral QAC breakfast  . lisinopril  10 mg Oral Daily  . potassium chloride SA  20 mEq Oral BID  . rosuvastatin  20 mg Oral Daily  . sodium chloride flush  10-40 mL Intracatheter Q12H  . tamsulosin  0.4 mg Oral QHS  . umeclidinium bromide  1 puff Inhalation Daily  . vitamin B-12  1,000 mcg Oral QHS    Continuous Infusions:   PRN Meds: acetaminophen **OR** acetaminophen, albuterol, sodium chloride flush   Labs   Results for orders placed or performed during the hospital encounter of 05/01/21 (from the past 48 hour(s))  Glucose, capillary     Status: Abnormal   Collection Time: 05/03/21  1:57 PM  Result Value Ref Range   Glucose-Capillary 197 (H) 70 - 99 mg/dL    Comment: Glucose reference range applies only to samples taken after fasting for at least 8 hours.  Glucose, capillary  Status: Abnormal   Collection Time: 05/03/21  4:24 PM  Result Value Ref Range   Glucose-Capillary 167 (H) 70 - 99 mg/dL    Comment: Glucose reference range applies only to samples taken after fasting for at least 8 hours.  Glucose, capillary     Status: Abnormal   Collection Time: 05/03/21  9:15 PM  Result Value Ref Range   Glucose-Capillary 141 (H) 70 - 99 mg/dL    Comment: Glucose reference range applies only to samples taken after fasting for at least 8 hours.  Basic metabolic panel     Status: Abnormal   Collection Time: 05/04/21  2:37 AM  Result Value Ref Range   Sodium 141 135 - 145 mmol/L   Potassium 3.5 3.5 - 5.1 mmol/L   Chloride 102 98 - 111 mmol/L    CO2 30 22 - 32 mmol/L   Glucose, Bld 122 (H) 70 - 99 mg/dL    Comment: Glucose reference range applies only to samples taken after fasting for at least 8 hours.   BUN 18 8 - 23 mg/dL   Creatinine, Ser 1.51 (H) 0.61 - 1.24 mg/dL   Calcium 8.8 (L) 8.9 - 10.3 mg/dL   GFR, Estimated 46 (L) >60 mL/min    Comment: (NOTE) Calculated using the CKD-EPI Creatinine Equation (2021)    Anion gap 9 5 - 15    Comment: Performed at [facility] [address], [place], [place] [address]  Glucose, capillary     Status: Abnormal   Collection Time: 05/04/21  6:37 AM  Result Value Ref Range   Glucose-Capillary 114 (H) 70 - 99 mg/dL    Comment: Glucose reference range applies only to samples taken after fasting for at least 8 hours.  Glucose, capillary     Status: Abnormal   Collection Time: 05/04/21 11:00 AM  Result Value Ref Range   Glucose-Capillary 185 (H) 70 - 99 mg/dL    Comment: Glucose reference range applies only to samples taken after fasting for at least 8 hours.  Glucose, capillary     Status: Abnormal   Collection Time: 05/04/21  4:37 PM  Result Value Ref Range   Glucose-Capillary 164 (H) 70 - 99 mg/dL    Comment: Glucose reference range applies only to samples taken after fasting for at least 8 hours.  Glucose, capillary     Status: Abnormal   Collection Time: 05/04/21  8:45 PM  Result Value Ref Range   Glucose-Capillary 246 (H) 70 - 99 mg/dL    Comment: Glucose reference range applies only to samples taken after fasting for at least 8 hours.  Basic metabolic panel     Status: Abnormal   Collection Time: 05/05/21  3:43 AM  Result Value Ref Range   Sodium 139 135 - 145 mmol/L   Potassium 3.4 (L) 3.5 - 5.1 mmol/L   Chloride 104 98 - 111 mmol/L   CO2 30 22 - 32 mmol/L   Glucose, Bld 142 (H) 70 - 99 mg/dL    Comment: Glucose reference range applies only to samples taken after fasting for at least 8 hours.   BUN 18 8 - 23 mg/dL   Creatinine, Ser 1.58 (H) 0.61 - 1.24 mg/dL    Calcium 8.7 (L) 8.9 - 10.3 mg/dL   GFR, Estimated 44 (L) >60 mL/min    Comment: (NOTE) Calculated using the CKD-EPI Creatinine Equation (2021)    Anion gap 5 5 - 15    Comment: Performed at [facility], [place] [address], [place], [place] [address]  Glucose, capillary     Status: Abnormal   Collection Time: 05/05/21  6:15 AM  Result Value Ref Range   Glucose-Capillary 125 (H) 70 - 99 mg/dL    Comment: Glucose reference range applies only to samples taken after fasting for at least 8 hours.    ECG   N/A  Telemetry   Atrial flutter with 5:1 conduction - Personally Reviewed  Radiology    ECHOCARDIOGRAM LIMITED  Result Date: 05/04/2021    ECHOCARDIOGRAM LIMITED REPORT   Patient Name:   Lelan S Wilkins Date of Exam: 05/04/2021 Medical Rec #:  [id number]      Height:       67.0 in Accession #:    [id number]     Weight:       217.8 lb Date of Birth:  02-19-1941      BSA:          2.097 m Patient Age:    48 years       BP:           160/65 mmHg Patient Gender: M              HR:           58 bpm. Exam Location:  Inpatient Procedure: Limited Echo, Limited Color Doppler and Cardiac Doppler Indications:    aortic stenosis. acute diastolic chf.  History:        Patient has prior history of Echocardiogram examinations, most                 recent 02/27/2021. CHF, chronic kidney disease, Aortic Valve                 Disease, Arrythmias:Atrial Flutter; Risk Factors:Sleep Apnea,                 Hypertension and Dyslipidemia.  Sonographer:    Johny Chess Referring Phys: [place]  1. Left ventricular ejection fraction, by estimation, is 45 to 50%. Left ventricular ejection fraction by PLAX is 45 %. The left ventricle has mildly decreased function. There is mild left ventricular hypertrophy. Left ventricular diastolic function could not be evaluated.  2. The aortic valve is tricuspid. Moderate aortic valve stenosis. Aortic valve area, by VTI measures 1.18 cm. Aortic valve mean  gradient measures 17.5 mmHg. Aortic valve Vmax measures 2.82 m/s. DI is 0.38.  3. There is severely elevated pulmonary artery systolic pressure.  4. Tricuspid valve regurgitation is mild to moderate.  5. The inferior vena cava is normal in size with <50% respiratory variability, suggesting right atrial pressure of 8 mmHg. Comparison(s): Changes from prior study are noted. 02/27/2021: LVEF 40-45%, mild AS - mean gradient 16 mmHg. FINDINGS  Left Ventricle: Left ventricular ejection fraction, by estimation, is 45 to 50%. Left ventricular ejection fraction by PLAX is 45 %. The left ventricle has mildly decreased function. The left ventricular internal cavity size was normal in size. There is  mild left ventricular hypertrophy. Left ventricular diastolic function could not be evaluated. Left ventricular diastolic function could not be evaluated due to atrial fibrillation. Right Ventricle: There is severely elevated pulmonary artery systolic pressure. The tricuspid regurgitant velocity is 4.21 m/s, and with an assumed right atrial pressure of 8 mmHg, the estimated right ventricular systolic pressure is 21.3 mmHg. Tricuspid Valve: The tricuspid valve is grossly normal. Tricuspid valve regurgitation is mild to moderate. Aortic Valve: The aortic valve is  tricuspid. Moderate aortic stenosis is present. Aortic valve mean gradient measures 17.5 mmHg. Aortic valve peak gradient measures 31.8 mmHg. Aortic valve area, by VTI measures 1.18 cm. Venous: The inferior vena cava is normal in size with less than 50% respiratory variability, suggesting right atrial pressure of 8 mmHg. EKG: Rhythm strip during this exam demostrated atrial flutter. LEFT VENTRICLE PLAX 2D LV EF:         Left ventricular ejection fraction by PLAX is 45 %. LVIDd:         4.90 cm LVIDs:         3.80 cm LV PW:         1.10 cm LV IVS:        1.00 cm LVOT diam:     2.00 cm LV SV:         72 LV SV Index:   34 LVOT Area:     3.14 cm  IVC IVC diam: 1.90 cm LEFT  ATRIUM         Index LA diam:    5.00 cm 2.38 cm/m  AORTIC VALVE AV Area (Vmax):    1.10 cm AV Area (Vmean):   1.09 cm AV Area (VTI):     1.18 cm AV Vmax:           282.00 cm/s AV Vmean:          191.500 cm/s AV VTI:            0.608 m AV Peak Grad:      31.8 mmHg AV Mean Grad:      17.5 mmHg LVOT Vmax:         98.95 cm/s LVOT Vmean:        66.450 cm/s LVOT VTI:          0.229 m LVOT/AV VTI ratio: 0.38 TRICUSPID VALVE TR Peak grad:   70.9 mmHg TR Vmax:        421.00 cm/s  SHUNTS Systemic VTI:  0.23 m Systemic Diam: 2.00 cm Lyman Bishop MD Electronically signed by Lyman Bishop MD Signature Date/Time: 05/04/2021/12:31:32 PM    Final     Cardiac Studies   N/A  Assessment   Principal Problem:   CHF (congestive heart failure) (HCC) Active Problems:   Hyperlipidemia   CAD S/P percutaneous coronary angioplasty   Type 1 diabetes mellitus with nephropathy (HCC)   Thrombocytopenia (HCC)   Obstructive sleep apnea   Chronic diastolic congestive heart failure (HCC)   Chronic kidney disease (CKD), stage III (moderate) (HCC)   S/P CABG x 2   Essential hypertension   Adenocarcinoma of right lung, stage 4 (HCC)   CAP (community acquired pneumonia)   Acute on chronic combined systolic and diastolic CHF (congestive heart failure) (HCC)   Typical atrial flutter ([place])   Plan   1. [name] appears to be compensated with CHF at this point. Now on oral lasix. BP well controlled. Still sundowning - needs to be at home. Creatinine stable - low K+, will replete (he is on standing 20MEQ BID potassium, increase to 40 MEQ BID - will also help with flutter). I have arranged for DCCV tomorrow at 12:30 - keep NPO p MN except meds (for sure needs to have Eliquis dosing in the am before cardioversion).  Hopefully, if that is successful, he could be discharged later tomorrow afternoon.  Time Spent Directly with Patient:  I have spent a total of 25 minutes with the patient reviewing hospital notes, telemetry,  EKGs,  labs and examining the patient as well as establishing an assessment and plan that was discussed personally with the patient.  > 50% of time was spent in direct patient care.  Length of Stay:  LOS: 3 days   Pixie Casino, MD, [facility], [place] Director of the Advanced Lipid Disorders &  Cardiovascular Risk Reduction Clinic Diplomate of the American Board of Clinical Lipidology Attending Cardiologist  Direct Dial: [phone]  Fax: [phone]  Website:  [email or web address] 05/05/2021, 8:07 AM

## 2021-05-05 NOTE — Progress Notes (Signed)
PROGRESS NOTE    Randall Wilkins  LKG:[id number] DOB: [birth year] DOA: 05/01/2021 PCP: [name], MD  Brief Narrative: 80 y.o. male with history of chronic systolic heart failure, paroxysmal atrial fibrillation, CAD s/p CABG, hypertension, diabetes mellitus and non-small cell lung cancer stage IV being followed by Dr. Julien Nordmann oncologist presents to the ER because of increasing shortness of breath over the last 48 hours.  Patient shortness of breath is increased on exertion and on lying down.  Has had some cough with some sputum production.  Denies fever chills chest pain.  Has not noticed any gaining weight.  ED Course: In the ER patient was in A. fib rate controlled.  Chest x-ray shows possible fluid effusion and possible infiltrate.  Labs show pancytopenia with creatinine 1.5 glucose 248 and COVID test were negative.  Blood cultures were obtained patient was started on empiric antibiotics for possible pneumonia and admitted for further management.  On my exam patient looks mildly short of breath and has features concerning for CHF and I placed patient on Lasix.   Assessment & Plan:   Principal Problem:   CHF (congestive heart failure) (HCC) Active Problems:   Hyperlipidemia   CAD S/P percutaneous coronary angioplasty   Type 1 diabetes mellitus with nephropathy (HCC)   Thrombocytopenia (HCC)   Obstructive sleep apnea   Chronic diastolic congestive heart failure (HCC)   Chronic kidney disease (CKD), stage III (moderate) (HCC)   S/P CABG x 2   Essential hypertension   Adenocarcinoma of right lung, stage 4 (HCC)   CAP (community acquired pneumonia)   Acute on chronic combined systolic and diastolic CHF (congestive heart failure) (HCC)   Typical atrial flutter (HCC)  #1 acute on chronic combined diastolic and systolic heart failure-he reports improvement with diuresis. Lasix switched to p.o. 05/04/2021, since switching to p.o. he is only negative by 200 cc.  Continue diuresis and  monitor renal functions Creatinine up some 1.58 from 1.51 from 1.48 Will follow.  Lasix changed to p.o.  Cardiology planning for possible cardioversion for persistent a flutter On BiDil, lisinopril Echo ordered for increased intensity of the murmur-echo ejection fraction 45 to 50%.  Elevated pulmonary artery systolic pressure.  Moderate tricuspid regurgitation and moderate aortic valve stenosis.  #2 CKD stage IIIa creatinine 1.58 from 1.51 from 1.48.  He is on Lasix ACE inhibitor.  #3 pancytopenia likely secondary to immunotherapy for malignancy.  Follow-up CBC.  #4 history of type 2 diabetes-on ssi and Lantus CBG (last 3)  Recent Labs    05/04/21 1637 05/04/21 2045 05/05/21 0615  GLUCAP 164* 246* 125*     #5 history of CAD and CABG continue current meds as above.  #6 chronic atrial fibrillation on Eliquis  #7 stage IV adenocarcinoma lung CA followed by Dr. Julien Nordmann  #8 history of sleep apnea on CPAP at home  #9 history of hypothyroidism on Synthroid  #10 history of gout on allopurinol  #11 history of hypertension on lisinopril and Norvasc  #12 history of BPH on Flomax  #13 history of hyperlipidemia on Crestor  #14 mild hypokalemia with potassium 3.4 potassium dose increased to 40 mEq twice a day by cardiology.  Follow-up levels in a.m.  Check magnesium level.    Estimated body mass index is 34.18 kg/m as calculated from the following:   Height as of 04/10/21: 5\' 7"  (1.702 m).   Weight as of this encounter: 99 kg.  DVT prophylaxis: Eliquis  code Status: Full code Family Communication: Discussed with wife  at bedside  disposition Plan:  Status is: Inpatient  Dispo: The patient is from: Home              Anticipated d/c is to: Home              Patient currently is not medically stable to d/c.   Difficult to place patient No   Consultants: Cardiology  Procedures: None Antimicrobials: None  Subjective: Wife by the bedside he again had a rough night with  sundowning received Ativan 0.5 mg.  Wife would like to stay back tonight to make him comfortable and to minimize sundowning.    Objective: Vitals:   05/04/21 1950 05/04/21 2318 05/05/21 0334 05/05/21 0749  BP: 126/83 (!) 151/73 (!) 154/56   Pulse: 69 79 (!) 54   Resp: 18 20 20    Temp: 98.2 F (36.8 C) 97.6 F (36.4 C) 97.6 F (36.4 C)   TempSrc: Oral Axillary Axillary   SpO2: 96% 96% 94% 92%  Weight:   99 kg     Intake/Output Summary (Last 24 hours) at 05/05/2021 1041 Last data filed at 05/05/2021 0334 Gross per 24 hour  Intake 240 ml  Output 450 ml  Net -210 ml   Filed Weights   05/03/21 0634 05/04/21 0222 05/05/21 0334  Weight: 101.2 kg 98.8 kg 99 kg    Examination:  General exam: Appears calm and comfortable  Respiratory system diminished at the bases to auscultation. Respiratory effort normal. Cardiovascular system: S1 & S2 heard, RRR. No JVD, systolic murmurs, rubs, gallops or clicks. Gastrointestinal system: Abdomen is nondistended, soft and nontender. No organomegaly or masses felt. Normal bowel sounds heard. Central nervous system: Alert and oriented. No focal neurological deficits. Extremities: 2+ pitting edema bilateral lower extremity Skin: No rashes, lesions or ulcers Psychiatry: Judgement and insight appear normal. Mood & affect appropriate.     Data Reviewed: I have personally reviewed following labs and imaging studies  CBC: Recent Labs  Lab 05/01/21 1618  WBC 3.5*  NEUTROABS 2.2  HGB 11.0*  HCT 35.9*  MCV 95.2  PLT 768*   Basic Metabolic Panel: Recent Labs  Lab 05/01/21 1618 05/03/21 0700 05/04/21 0237 05/05/21 0343  NA 139 142 141 139  K 4.5 3.5 3.5 3.4*  CL 105 106 102 104  CO2 26 30 30 30   GLUCOSE 248* 119* 122* 142*  BUN 20 16 18 18   CREATININE 1.54* 1.48* 1.51* 1.58*  CALCIUM 9.0 8.8* 8.8* 8.7*   GFR: Estimated Creatinine Clearance: 41.8 mL/min (A) (by C-G formula based on SCr of 1.58 mg/dL (H)). Liver Function Tests: Recent  Labs  Lab 05/01/21 1618  AST 20  ALT 11  ALKPHOS 72  BILITOT 1.0  PROT 6.3*  ALBUMIN 3.6   No results for input(s): LIPASE, AMYLASE in the last 168 hours. No results for input(s): AMMONIA in the last 168 hours. Coagulation Profile: No results for input(s): INR, PROTIME in the last 168 hours. Cardiac Enzymes: No results for input(s): CKTOTAL, CKMB, CKMBINDEX, TROPONINI in the last 168 hours. BNP (last 3 results) No results for input(s): PROBNP in the last 8760 hours. HbA1C: No results for input(s): HGBA1C in the last 72 hours. CBG: Recent Labs  Lab 05/04/21 0637 05/04/21 1100 05/04/21 1637 05/04/21 2045 05/05/21 0615  GLUCAP 114* 185* 164* 246* 125*   Lipid Profile: No results for input(s): CHOL, HDL, LDLCALC, TRIG, CHOLHDL, LDLDIRECT in the last 72 hours. Thyroid Function Tests: No results for input(s): TSH, T4TOTAL, FREET4, T3FREE, THYROIDAB in  the last 72 hours. Anemia Panel: No results for input(s): VITAMINB12, FOLATE, FERRITIN, TIBC, IRON, RETICCTPCT in the last 72 hours. Sepsis Labs: Recent Labs  Lab 05/02/21 0200  PROCALCITON <0.10    Recent Results (from the past 240 hour(s))  Resp Panel by RT-PCR (Flu A&B, Covid) Nasopharyngeal Swab     Status: None   Collection Time: 05/01/21  4:21 PM   Specimen: Nasopharyngeal Swab; Nasopharyngeal(NP) swabs in vial transport medium  Result Value Ref Range Status   SARS Coronavirus 2 by RT PCR NEGATIVE NEGATIVE Final    Comment: (NOTE) SARS-CoV-2 target nucleic acids are NOT DETECTED.  The SARS-CoV-2 RNA is generally detectable in upper respiratory specimens during the acute phase of infection. The lowest concentration of SARS-CoV-2 viral copies this assay can detect is 138 copies/mL. A negative result does not preclude SARS-Cov-2 infection and should not be used as the sole basis for treatment or other patient management decisions. A negative result may occur with  improper specimen collection/handling, submission  of specimen other than nasopharyngeal swab, presence of viral mutation(s) within the areas targeted by this assay, and inadequate number of viral copies(<138 copies/mL). A negative result must be combined with clinical observations, patient history, and epidemiological information. The expected result is Negative.  Fact Sheet for Patients:  EntrepreneurPulse.com.au  Fact Sheet for Healthcare Providers:  IncredibleEmployment.be  This test is no t yet approved or cleared by the [place] FDA and  has been authorized for detection and/or diagnosis of SARS-CoV-2 by FDA under an Emergency Use Authorization (EUA). This EUA will remain  in effect (meaning this test can be used) for the duration of the COVID-19 declaration under Section 564(b)(1) of the Act, 21 U.S.C.section 360bbb-3(b)(1), unless the authorization is terminated  or revoked sooner.       Influenza A by PCR NEGATIVE NEGATIVE Final   Influenza B by PCR NEGATIVE NEGATIVE Final    Comment: (NOTE) The Xpert Xpress SARS-CoV-2/FLU/RSV plus assay is intended as an aid in the diagnosis of influenza from Nasopharyngeal swab specimens and should not be used as a sole basis for treatment. Nasal washings and aspirates are unacceptable for Xpert Xpress SARS-CoV-2/FLU/RSV testing.  Fact Sheet for Patients: EntrepreneurPulse.com.au  Fact Sheet for Healthcare Providers: IncredibleEmployment.be  This test is not yet approved or cleared by the [place] FDA and has been authorized for detection and/or diagnosis of SARS-CoV-2 by FDA under an Emergency Use Authorization (EUA). This EUA will remain in effect (meaning this test can be used) for the duration of the COVID-19 declaration under Section 564(b)(1) of the Act, 21 U.S.C. section 360bbb-3(b)(1), unless the authorization is terminated or revoked.  Performed at [facility], [place] [address],  [place], [place] [address]   Blood culture (routine x 2)     Status: None (Preliminary result)   Collection Time: 05/01/21 10:50 PM   Specimen: BLOOD  Result Value Ref Range Status   Specimen Description BLOOD LEFT ANTECUBITAL  Final   Special Requests   Final    BOTTLES DRAWN AEROBIC AND ANAEROBIC Blood Culture adequate volume   Culture   Final    NO GROWTH 3 DAYS Performed at [facility], [place] [address] [place], [place] [address]    Report Status PENDING  Incomplete  MRSA PCR Screening     Status: None   Collection Time: 05/02/21  2:56 PM   Specimen: Nasopharyngeal  Result Value Ref Range Status   MRSA by PCR NEGATIVE NEGATIVE Final  Comment:        The GeneXpert MRSA Assay (FDA approved for NASAL specimens only), is one component of a comprehensive MRSA colonization surveillance program. It is not intended to diagnose MRSA infection nor to guide or monitor treatment for MRSA infections. Performed at [facility], [place] [address], [place], [place] [address]          Radiology Studies: ECHOCARDIOGRAM LIMITED  Result Date: 05/04/2021    ECHOCARDIOGRAM LIMITED REPORT   Patient Name:   Gabrial S Wilkins Date of Exam: 05/04/2021 Medical Rec #:  [id number]      Height:       67.0 in Accession #:    [id number]     Weight:       217.8 lb Date of Birth:  11-Feb-1941      BSA:          2.097 m Patient Age:    25 years       BP:           160/65 mmHg Patient Gender: M              HR:           58 bpm. Exam Location:  Inpatient Procedure: Limited Echo, Limited Color Doppler and Cardiac Doppler Indications:    aortic stenosis. acute diastolic chf.  History:        Patient has prior history of Echocardiogram examinations, most                 recent 02/27/2021. CHF, chronic kidney disease, Aortic Valve                 Disease, Arrythmias:Atrial Flutter; Risk Factors:Sleep Apnea,                 Hypertension and Dyslipidemia.  Sonographer:    Johny Chess Referring Phys: [place]  1. Left ventricular ejection fraction, by estimation, is 45 to 50%. Left ventricular ejection fraction by PLAX is 45 %. The left ventricle has mildly decreased function. There is mild left ventricular hypertrophy. Left ventricular diastolic function could not be evaluated.  2. The aortic valve is tricuspid. Moderate aortic valve stenosis. Aortic valve area, by VTI measures 1.18 cm. Aortic valve mean gradient measures 17.5 mmHg. Aortic valve Vmax measures 2.82 m/s. DI is 0.38.  3. There is severely elevated pulmonary artery systolic pressure.  4. Tricuspid valve regurgitation is mild to moderate.  5. The inferior vena cava is normal in size with <50% respiratory variability, suggesting right atrial pressure of 8 mmHg. Comparison(s): Changes from prior study are noted. 02/27/2021: LVEF 40-45%, mild AS - mean gradient 16 mmHg. FINDINGS  Left Ventricle: Left ventricular ejection fraction, by estimation, is 45 to 50%. Left ventricular ejection fraction by PLAX is 45 %. The left ventricle has mildly decreased function. The left ventricular internal cavity size was normal in size. There is  mild left ventricular hypertrophy. Left ventricular diastolic function could not be evaluated. Left ventricular diastolic function could not be evaluated due to atrial fibrillation. Right Ventricle: There is severely elevated pulmonary artery systolic pressure. The tricuspid regurgitant velocity is 4.21 m/s, and with an assumed right atrial pressure of 8 mmHg, the estimated right ventricular systolic pressure is 40.1 mmHg. Tricuspid Valve: The tricuspid valve is grossly normal. Tricuspid valve regurgitation is mild to moderate. Aortic Valve: The aortic valve is tricuspid. Moderate aortic stenosis is present. Aortic valve mean gradient measures 17.5 mmHg. Aortic  valve peak gradient measures 31.8 mmHg. Aortic valve area, by VTI measures 1.18 cm. Venous: The inferior vena cava is normal in size with less than  50% respiratory variability, suggesting right atrial pressure of 8 mmHg. EKG: Rhythm strip during this exam demostrated atrial flutter. LEFT VENTRICLE PLAX 2D LV EF:         Left ventricular ejection fraction by PLAX is 45 %. LVIDd:         4.90 cm LVIDs:         3.80 cm LV PW:         1.10 cm LV IVS:        1.00 cm LVOT diam:     2.00 cm LV SV:         72 LV SV Index:   34 LVOT Area:     3.14 cm  IVC IVC diam: 1.90 cm LEFT ATRIUM         Index LA diam:    5.00 cm 2.38 cm/m  AORTIC VALVE AV Area (Vmax):    1.10 cm AV Area (Vmean):   1.09 cm AV Area (VTI):     1.18 cm AV Vmax:           282.00 cm/s AV Vmean:          191.500 cm/s AV VTI:            0.608 m AV Peak Grad:      31.8 mmHg AV Mean Grad:      17.5 mmHg LVOT Vmax:         98.95 cm/s LVOT Vmean:        66.450 cm/s LVOT VTI:          0.229 m LVOT/AV VTI ratio: 0.38 TRICUSPID VALVE TR Peak grad:   70.9 mmHg TR Vmax:        421.00 cm/s  SHUNTS Systemic VTI:  0.23 m Systemic Diam: 2.00 cm Lyman Bishop MD Electronically signed by Lyman Bishop MD Signature Date/Time: 05/04/2021/12:31:32 PM    Final         Scheduled Meds: . allopurinol  100 mg Oral Daily  . amitriptyline  75 mg Oral QHS  . amLODipine  10 mg Oral Daily  . apixaban  5 mg Oral BID  . Chlorhexidine Gluconate Cloth  6 each Topical Daily  . furosemide  60 mg Oral BID  . insulin aspart  0-9 Units Subcutaneous TID WC  . insulin glargine  12 Units Subcutaneous QHS  . isosorbide-hydrALAZINE  1 tablet Oral BID  . ketorolac  1 drop Right Eye TID AC & HS  . levothyroxine  75 mcg Oral QAC breakfast  . lisinopril  10 mg Oral Daily  . potassium chloride SA  40 mEq Oral BID  . rosuvastatin  20 mg Oral Daily  . sodium chloride flush  10-40 mL Intracatheter Q12H  . tamsulosin  0.4 mg Oral QHS  . umeclidinium bromide  1 puff Inhalation Daily  . vitamin B-12  1,000 mcg Oral QHS   Continuous Infusions:   LOS: 3 days    Georgette Shell, MD  05/05/2021, 10:41 AM

## 2021-05-05 NOTE — Progress Notes (Signed)
   Notified by pharmacy that the patient apparently missed a dose of Eliquis the other evening - it was documented by nursing as refused - he has been encephalopathic, however, this is a necessary and important medication. D/t this, he will now need a TEE/DCCV to exclude thrombus prior to cardioversion tomorrow.  I have asked endoscopy to accommodate this change if possible.  Pixie Casino, MD, [facility], [place] Director of the [facility] Lipid Disorders &  Cardiovascular Risk Reduction Clinic Diplomate of the American Board of Clinical Lipidology Attending Cardiologist  Direct Dial: [phone]  Fax: [phone]  Website:  [email or web address]

## 2021-05-05 NOTE — Plan of Care (Signed)
  Problem: Education: Goal: Knowledge of General Education information will improve Description: Including pain rating scale, medication(s)/side effects and non-pharmacologic comfort measures Outcome: Progressing   Problem: Clinical Measurements: Goal: Ability to maintain clinical measurements within normal limits will improve Outcome: Progressing   Problem: Clinical Measurements: Goal: Diagnostic test results will improve Outcome: Progressing   Problem: Clinical Measurements: Goal: Cardiovascular complication will be avoided Outcome: Progressing   Problem: Activity: Goal: Risk for activity intolerance will decrease Outcome: Progressing   Problem: Nutrition: Goal: Adequate nutrition will be maintained Outcome: Progressing   Problem: Coping: Goal: Level of anxiety will decrease Outcome: Progressing   Problem: Pain Managment: Goal: General experience of comfort will improve Outcome: Progressing   Problem: Skin Integrity: Goal: Risk for impaired skin integrity will decrease Outcome: Progressing

## 2021-05-05 NOTE — Progress Notes (Signed)
Mobility Specialist: Progress Note   05/05/21 1448  Mobility  Activity Ambulated in hall  Level of Assistance Minimal assist, patient does 75% or more  Assistive Device Front wheel walker  Distance Ambulated (ft) 172 ft  Mobility Ambulated with assistance in hallway  Mobility Response Tolerated well  Mobility performed by Mobility specialist  Bed Position Chair  $Mobility charge 1 Mobility   Pre-Mobility: 73 HR, 97% SpO2 During-Moibility: 87-88% SpO2 Post-Mobility: 55 HR, 144/55 BP, 100% SpO2  Pt ambulated on 3 L/min [place]. Pt was minA to stand from EOB and contact guard during ambulation. Pt did desat to 87-88% during ambulation but pt asx. Pt back to chair after walk on 3 L in the room, recovered quickly. Pt's wife present in the room.   [place] [facility] Alise Calais Mobility Specialist Mobility Specialist Phone: [phone]

## 2021-05-06 ENCOUNTER — Encounter (HOSPITAL_COMMUNITY)
Admission: EM | Disposition: A | Discharge status: Home / Self Care | Payer: Self-pay | Source: Home / Self Care | Attending: Internal Medicine

## 2021-05-06 ENCOUNTER — Encounter (HOSPITAL_COMMUNITY): Payer: Self-pay | Admitting: Internal Medicine

## 2021-05-06 ENCOUNTER — Inpatient Hospital Stay (HOSPITAL_COMMUNITY): Payer: Medicare Other | Admitting: Anesthesiology

## 2021-05-06 ENCOUNTER — Inpatient Hospital Stay (HOSPITAL_COMMUNITY)
Admit: 2021-05-06 | Discharge: 2021-05-06 | Disposition: A | Discharge status: Home / Self Care | Payer: Medicare Other | Attending: Cardiovascular Disease | Admitting: Cardiovascular Disease

## 2021-05-06 DIAGNOSIS — N183 Chronic kidney disease, stage 3 unspecified: Secondary | ICD-10-CM | POA: Diagnosis not present

## 2021-05-06 DIAGNOSIS — I4892 Unspecified atrial flutter: Secondary | ICD-10-CM | POA: Diagnosis not present

## 2021-05-06 DIAGNOSIS — I350 Nonrheumatic aortic (valve) stenosis: Secondary | ICD-10-CM | POA: Diagnosis not present

## 2021-05-06 DIAGNOSIS — I5043 Acute on chronic combined systolic (congestive) and diastolic (congestive) heart failure: Secondary | ICD-10-CM | POA: Diagnosis not present

## 2021-05-06 DIAGNOSIS — I5021 Acute systolic (congestive) heart failure: Secondary | ICD-10-CM | POA: Diagnosis not present

## 2021-05-06 DIAGNOSIS — I2510 Atherosclerotic heart disease of native coronary artery without angina pectoris: Secondary | ICD-10-CM | POA: Diagnosis not present

## 2021-05-06 DIAGNOSIS — Z9861 Coronary angioplasty status: Secondary | ICD-10-CM

## 2021-05-06 DIAGNOSIS — I483 Typical atrial flutter: Secondary | ICD-10-CM | POA: Diagnosis not present

## 2021-05-06 HISTORY — PX: CARDIOVERSION: SHX1299

## 2021-05-06 HISTORY — PX: TEE WITHOUT CARDIOVERSION: SHX5443

## 2021-05-06 LAB — CBC
HCT: 32.3 % — ABNORMAL LOW (ref 39.0–52.0)
Hemoglobin: 9.9 g/dL — ABNORMAL LOW (ref 13.0–17.0)
MCH: 28.9 pg (ref 26.0–34.0)
MCHC: 30.7 g/dL (ref 30.0–36.0)
MCV: 94.4 fL (ref 80.0–100.0)
Platelets: 107 10*3/uL — ABNORMAL LOW (ref 150–400)
RBC: 3.42 MIL/uL — ABNORMAL LOW (ref 4.22–5.81)
RDW: 15.8 % — ABNORMAL HIGH (ref 11.5–15.5)
WBC: 4.7 10*3/uL (ref 4.0–10.5)
nRBC: 0 % (ref 0.0–0.2)

## 2021-05-06 LAB — COMPREHENSIVE METABOLIC PANEL
ALT: 10 U/L (ref 0–44)
AST: 15 U/L (ref 15–41)
Albumin: 3.1 g/dL — ABNORMAL LOW (ref 3.5–5.0)
Alkaline Phosphatase: 69 U/L (ref 38–126)
Anion gap: 6 (ref 5–15)
BUN: 21 mg/dL (ref 8–23)
CO2: 31 mmol/L (ref 22–32)
Calcium: 8.9 mg/dL (ref 8.9–10.3)
Chloride: 104 mmol/L (ref 98–111)
Creatinine, Ser: 1.6 mg/dL — ABNORMAL HIGH (ref 0.61–1.24)
GFR, Estimated: 43 mL/min — ABNORMAL LOW (ref >60)
Glucose, Bld: 167 mg/dL — ABNORMAL HIGH (ref 70–99)
Potassium: 4.1 mmol/L (ref 3.5–5.1)
Sodium: 141 mmol/L (ref 135–145)
Total Bilirubin: 0.5 mg/dL (ref 0.3–1.2)
Total Protein: 5.4 g/dL — ABNORMAL LOW (ref 6.5–8.1)

## 2021-05-06 LAB — ECHO TEE
AR max vel: 1.11 cm2
AV Area VTI: 1.11 cm2
AV Area mean vel: 1.18 cm2
AV Mean grad: 23.9 mmHg
AV Peak grad: 44.9 mmHg
Ao pk vel: 3.35 m/s

## 2021-05-06 LAB — GLUCOSE, CAPILLARY
Glucose-Capillary: 137 mg/dL — ABNORMAL HIGH (ref 70–99)
Glucose-Capillary: 106 mg/dL — ABNORMAL HIGH (ref 70–99)
Glucose-Capillary: 119 mg/dL — ABNORMAL HIGH (ref 70–99)
Glucose-Capillary: 189 mg/dL — ABNORMAL HIGH (ref 70–99)

## 2021-05-06 SURGERY — ECHOCARDIOGRAM, TRANSESOPHAGEAL
Anesthesia: Monitor Anesthesia Care

## 2021-05-06 MED ORDER — SODIUM CHLORIDE 0.9 % IV SOLN: INTRAVENOUS | Status: DC

## 2021-05-06 MED ORDER — PROPOFOL 500 MG/50ML IV EMUL
INTRAVENOUS | Status: DC | PRN
  Administered 2021-05-06: 75 ug/kg/min via INTRAVENOUS

## 2021-05-06 MED ORDER — PROPOFOL 10 MG/ML IV BOLUS
INTRAVENOUS | Status: DC | PRN
  Administered 2021-05-06 (×2): 10 mg via INTRAVENOUS

## 2021-05-06 MED ORDER — FUROSEMIDE 40 MG PO TABS
40.0000 mg | ORAL_TABLET | Freq: Two times a day (BID) | ORAL | Status: DC
  Administered 2021-05-06 – 2021-05-07 (×2): 40 mg via ORAL
  Filled 2021-05-06 (×4): qty 1

## 2021-05-06 NOTE — Anesthesia Procedure Notes (Signed)
Procedure Name: MAC Date/Time: 05/06/2021 1:16 PM Performed by: Jenne Campus, CRNA Pre-anesthesia Checklist: Patient identified, Emergency Drugs available, Suction available, Patient being monitored and Timeout performed Oxygen Delivery Method: Nasal cannula

## 2021-05-06 NOTE — H&P (View-Only) (Signed)
DAILY PROGRESS NOTE   Patient Name: Randall Wilkins Date of Encounter: 05/06/2021 Cardiologist: Pixie Casino, MD  Chief Complaint   No complaints  Patient Profile   Randall Wilkins is a 80 y.o. male with a hx of chronic respiratory failure on home O2, hypertension, hyperlipidemia, IDDM, venous insufficiency, OSA not on CPAP therapy, CAD s/p CABG 2016, persistent atrial fibrillation, history of Mob[place] 1 heart block, and stage IV non-small cell lung cancer who is being seen 05/02/2021 for the evaluation of dyspnea at the request of Dr. Hal Hope.   Subjective   No issues in the chart overnight - plan for TEE/DCCV today with Dr. Audie Box, unfortunately, due to a missed dose of Eliquis. He remains in rate-controlled flutter. Still net negative 1.4L yesterday. Creatinine is fairly stable.   Objective   Vitals:   05/05/21 2351 05/06/21 0350 05/06/21 0500 05/06/21 0729  BP: 135/62 (!) 114/46    Pulse: 62 67    Resp: 12 20    Temp: 98.4 F (36.9 C) 98.1 F (36.7 C)    TempSrc: Oral Oral    SpO2: 99% 100%  100%  Weight:   99.3 kg     Intake/Output Summary (Last 24 hours) at 05/06/2021 0827 Last data filed at 05/06/2021 0350 Gross per 24 hour  Intake 240 ml  Output 1700 ml  Net -1460 ml   Filed Weights   05/04/21 0222 05/05/21 0334 05/06/21 0500  Weight: 98.8 kg 99 kg 99.3 kg    Physical Exam   General appearance: alert, no distress and pale Neck: JVD - 1 cm above sternal notch, no carotid bruit and thyroid not enlarged, symmetric, no tenderness/mass/nodules Lungs: diminished breath sounds bilaterally Heart: regular rate and rhythm, S1, S2 normal and systolic murmur: systolic ejection 3/6, blowing at 2nd right intercostal space Abdomen: soft, non-tender; bowel sounds normal; no masses,  no organomegaly Extremities: extremities normal, atraumatic, no cyanosis or edema Pulses: 2+ and symmetric Skin: Pale, warm, dry Neurologic: Grossly normal Psych: Pleasant, oriented this  morning  Inpatient Medications    Scheduled Meds: . allopurinol  100 mg Oral Daily  . amitriptyline  75 mg Oral QHS  . amLODipine  10 mg Oral Daily  . apixaban  5 mg Oral BID  . Chlorhexidine Gluconate Cloth  6 each Topical Daily  . furosemide  60 mg Oral BID  . insulin aspart  0-9 Units Subcutaneous TID WC  . insulin glargine  12 Units Subcutaneous QHS  . isosorbide-hydrALAZINE  1 tablet Oral BID  . ketorolac  1 drop Right Eye TID AC & HS  . levothyroxine  75 mcg Oral QAC breakfast  . lisinopril  10 mg Oral Daily  . potassium chloride SA  40 mEq Oral BID  . rosuvastatin  20 mg Oral Daily  . sodium chloride flush  10-40 mL Intracatheter Q12H  . tamsulosin  0.4 mg Oral QHS  . umeclidinium bromide  1 puff Inhalation Daily  . vitamin B-12  1,000 mcg Oral QHS    Continuous Infusions:   PRN Meds: acetaminophen **OR** acetaminophen, albuterol, sodium chloride flush   Labs   Results for orders placed or performed during the hospital encounter of 05/01/21 (from the past 48 hour(s))  Glucose, capillary     Status: Abnormal   Collection Time: 05/04/21 11:00 AM  Result Value Ref Range   Glucose-Capillary 185 (H) 70 - 99 mg/dL    Comment: Glucose reference range applies only to samples taken after fasting for at  least 8 hours.  Glucose, capillary     Status: Abnormal   Collection Time: 05/04/21  4:37 PM  Result Value Ref Range   Glucose-Capillary 164 (H) 70 - 99 mg/dL    Comment: Glucose reference range applies only to samples taken after fasting for at least 8 hours.  Glucose, capillary     Status: Abnormal   Collection Time: 05/04/21  8:45 PM  Result Value Ref Range   Glucose-Capillary 246 (H) 70 - 99 mg/dL    Comment: Glucose reference range applies only to samples taken after fasting for at least 8 hours.  Basic metabolic panel     Status: Abnormal   Collection Time: 05/05/21  3:43 AM  Result Value Ref Range   Sodium 139 135 - 145 mmol/L   Potassium 3.4 (L) 3.5 - 5.1  mmol/L   Chloride 104 98 - 111 mmol/L   CO2 30 22 - 32 mmol/L   Glucose, Bld 142 (H) 70 - 99 mg/dL    Comment: Glucose reference range applies only to samples taken after fasting for at least 8 hours.   BUN 18 8 - 23 mg/dL   Creatinine, Ser 1.58 (H) 0.61 - 1.24 mg/dL   Calcium 8.7 (L) 8.9 - 10.3 mg/dL   GFR, Estimated 44 (L) >60 mL/min    Comment: (NOTE) Calculated using the CKD-EPI Creatinine Equation (2021)    Anion gap 5 5 - 15    Comment: Performed at [facility] [address], [place], [place] [address]  Glucose, capillary     Status: Abnormal   Collection Time: 05/05/21  6:15 AM  Result Value Ref Range   Glucose-Capillary 125 (H) 70 - 99 mg/dL    Comment: Glucose reference range applies only to samples taken after fasting for at least 8 hours.  Glucose, capillary     Status: Abnormal   Collection Time: 05/05/21 11:18 AM  Result Value Ref Range   Glucose-Capillary 198 (H) 70 - 99 mg/dL    Comment: Glucose reference range applies only to samples taken after fasting for at least 8 hours.  Magnesium     Status: None   Collection Time: 05/05/21 12:14 PM  Result Value Ref Range   Magnesium 2.0 1.7 - 2.4 mg/dL    Comment: Performed at [facility] [address], [place], [place] [address]  CBC     Status: Abnormal   Collection Time: 05/05/21 12:14 PM  Result Value Ref Range   WBC 5.5 4.0 - 10.5 K/uL   RBC 3.97 (L) 4.22 - 5.81 MIL/uL   Hemoglobin 11.3 (L) 13.0 - 17.0 g/dL   HCT 37.1 (L) 39.0 - 52.0 %   MCV 93.5 80.0 - 100.0 fL   MCH 28.5 26.0 - 34.0 pg   MCHC 30.5 30.0 - 36.0 g/dL   RDW 15.9 (H) 11.5 - 15.5 %   Platelets 107 (L) 150 - 400 K/uL    Comment: Immature Platelet Fraction may be clinically indicated, consider ordering this additional test [id number] CONSISTENT WITH PREVIOUS RESULT REPEATED TO VERIFY    nRBC 0.0 0.0 - 0.2 %    Comment: Performed at [facility], [place] [address], [place], [place] [address]  Protime-INR     Status: None    Collection Time: 05/05/21 12:14 PM  Result Value Ref Range   Prothrombin Time 14.6 11.4 - 15.2 seconds   INR 1.1 0.8 - 1.2    Comment: (NOTE) INR goal varies based on  device and disease states. Performed at [facility], [place] [address], [place], [place] [address]   Glucose, capillary     Status: Abnormal   Collection Time: 05/05/21  4:34 PM  Result Value Ref Range   Glucose-Capillary 221 (H) 70 - 99 mg/dL    Comment: Glucose reference range applies only to samples taken after fasting for at least 8 hours.   Comment 1 Notify RN    Comment 2 Document in Chart   Glucose, capillary     Status: Abnormal   Collection Time: 05/05/21 10:01 PM  Result Value Ref Range   Glucose-Capillary 162 (H) 70 - 99 mg/dL    Comment: Glucose reference range applies only to samples taken after fasting for at least 8 hours.  CBC     Status: Abnormal   Collection Time: 05/06/21  3:49 AM  Result Value Ref Range   WBC 4.7 4.0 - 10.5 K/uL   RBC 3.42 (L) 4.22 - 5.81 MIL/uL   Hemoglobin 9.9 (L) 13.0 - 17.0 g/dL   HCT 32.3 (L) 39.0 - 52.0 %   MCV 94.4 80.0 - 100.0 fL   MCH 28.9 26.0 - 34.0 pg   MCHC 30.7 30.0 - 36.0 g/dL   RDW 15.8 (H) 11.5 - 15.5 %   Platelets 107 (L) 150 - 400 K/uL    Comment: Immature Platelet Fraction may be clinically indicated, consider ordering this additional test [id number] CONSISTENT WITH PREVIOUS RESULT    nRBC 0.0 0.0 - 0.2 %    Comment: Performed at [facility], [place] [address] [place], [place] [address]  Comprehensive metabolic panel     Status: Abnormal   Collection Time: 05/06/21  3:49 AM  Result Value Ref Range   Sodium 141 135 - 145 mmol/L   Potassium 4.1 3.5 - 5.1 mmol/L   Chloride 104 98 - 111 mmol/L   CO2 31 22 - 32 mmol/L   Glucose, Bld 167 (H) 70 - 99 mg/dL    Comment: Glucose reference range applies only to samples taken after fasting for at least 8 hours.   BUN 21 8 - 23 mg/dL   Creatinine, Ser 1.60 (H) 0.61 - 1.24 mg/dL   Calcium 8.9 8.9 -  10.3 mg/dL   Total Protein 5.4 (L) 6.5 - 8.1 g/dL   Albumin 3.1 (L) 3.5 - 5.0 g/dL   AST 15 15 - 41 U/L   ALT 10 0 - 44 U/L   Alkaline Phosphatase 69 38 - 126 U/L   Total Bilirubin 0.5 0.3 - 1.2 mg/dL   GFR, Estimated 43 (L) >60 mL/min    Comment: (NOTE) Calculated using the CKD-EPI Creatinine Equation (2021)    Anion gap 6 5 - 15    Comment: Performed at [facility], [place] [address] [place], [place] [address]  Glucose, capillary     Status: Abnormal   Collection Time: 05/06/21  5:56 AM  Result Value Ref Range   Glucose-Capillary 137 (H) 70 - 99 mg/dL    Comment: Glucose reference range applies only to samples taken after fasting for at least 8 hours.    ECG   N/A  Telemetry   Atrial flutter with 5:1 conduction - Personally Reviewed  Radiology    ECHOCARDIOGRAM LIMITED  Result Date: 05/04/2021    ECHOCARDIOGRAM LIMITED REPORT   Patient Name:   Randall Wilkins Date of Exam: 05/04/2021 Medical Rec #:  [id number]      Height:  67.0 in Accession #:    [id number]     Weight:       217.8 lb Date of Birth:  07/18/1941      BSA:          2.097 m Patient Age:    31 years       BP:           160/65 mmHg Patient Gender: M              HR:           58 bpm. Exam Location:  Inpatient Procedure: Limited Echo, Limited Color Doppler and Cardiac Doppler Indications:    aortic stenosis. acute diastolic chf.  History:        Patient has prior history of Echocardiogram examinations, most                 recent 02/27/2021. CHF, chronic kidney disease, Aortic Valve                 Disease, Arrythmias:Atrial Flutter; Risk Factors:Sleep Apnea,                 Hypertension and Dyslipidemia.  Sonographer:    Johny Chess Referring Phys: [place]  1. Left ventricular ejection fraction, by estimation, is 45 to 50%. Left ventricular ejection fraction by PLAX is 45 %. The left ventricle has mildly decreased function. There is mild left ventricular hypertrophy. Left ventricular  diastolic function could not be evaluated.  2. The aortic valve is tricuspid. Moderate aortic valve stenosis. Aortic valve area, by VTI measures 1.18 cm. Aortic valve mean gradient measures 17.5 mmHg. Aortic valve Vmax measures 2.82 m/s. DI is 0.38.  3. There is severely elevated pulmonary artery systolic pressure.  4. Tricuspid valve regurgitation is mild to moderate.  5. The inferior vena cava is normal in size with <50% respiratory variability, suggesting right atrial pressure of 8 mmHg. Comparison(s): Changes from prior study are noted. 02/27/2021: LVEF 40-45%, mild AS - mean gradient 16 mmHg. FINDINGS  Left Ventricle: Left ventricular ejection fraction, by estimation, is 45 to 50%. Left ventricular ejection fraction by PLAX is 45 %. The left ventricle has mildly decreased function. The left ventricular internal cavity size was normal in size. There is  mild left ventricular hypertrophy. Left ventricular diastolic function could not be evaluated. Left ventricular diastolic function could not be evaluated due to atrial fibrillation. Right Ventricle: There is severely elevated pulmonary artery systolic pressure. The tricuspid regurgitant velocity is 4.21 m/s, and with an assumed right atrial pressure of 8 mmHg, the estimated right ventricular systolic pressure is 73.5 mmHg. Tricuspid Valve: The tricuspid valve is grossly normal. Tricuspid valve regurgitation is mild to moderate. Aortic Valve: The aortic valve is tricuspid. Moderate aortic stenosis is present. Aortic valve mean gradient measures 17.5 mmHg. Aortic valve peak gradient measures 31.8 mmHg. Aortic valve area, by VTI measures 1.18 cm. Venous: The inferior vena cava is normal in size with less than 50% respiratory variability, suggesting right atrial pressure of 8 mmHg. EKG: Rhythm strip during this exam demostrated atrial flutter. LEFT VENTRICLE PLAX 2D LV EF:         Left ventricular ejection fraction by PLAX is 45 %. LVIDd:         4.90 cm LVIDs:          3.80 cm LV PW:         1.10 cm LV IVS:  1.00 cm LVOT diam:     2.00 cm LV SV:         72 LV SV Index:   34 LVOT Area:     3.14 cm  IVC IVC diam: 1.90 cm LEFT ATRIUM         Index LA diam:    5.00 cm 2.38 cm/m  AORTIC VALVE AV Area (Vmax):    1.10 cm AV Area (Vmean):   1.09 cm AV Area (VTI):     1.18 cm AV Vmax:           282.00 cm/s AV Vmean:          191.500 cm/s AV VTI:            0.608 m AV Peak Grad:      31.8 mmHg AV Mean Grad:      17.5 mmHg LVOT Vmax:         98.95 cm/s LVOT Vmean:        66.450 cm/s LVOT VTI:          0.229 m LVOT/AV VTI ratio: 0.38 TRICUSPID VALVE TR Peak grad:   70.9 mmHg TR Vmax:        421.00 cm/s  SHUNTS Systemic VTI:  0.23 m Systemic Diam: 2.00 cm Lyman Bishop MD Electronically signed by Lyman Bishop MD Signature Date/Time: 05/04/2021/12:31:32 PM    Final     Cardiac Studies   N/A  Assessment   Principal Problem:   CHF (congestive heart failure) (HCC) Active Problems:   Hyperlipidemia   CAD S/P percutaneous coronary angioplasty   Type 1 diabetes mellitus with nephropathy (HCC)   Thrombocytopenia (HCC)   Obstructive sleep apnea   Chronic diastolic congestive heart failure (HCC)   Chronic kidney disease (CKD), stage III (moderate) (HCC)   S/P CABG x 2   Essential hypertension   Adenocarcinoma of right lung, stage 4 (HCC)   CAP (community acquired pneumonia)   Acute on chronic combined systolic and diastolic CHF (congestive heart failure) (HCC)   Typical atrial flutter ([place])   Plan   1. [name] appears to well compensated at this point - he was another 1.5L negative with slight increase in creatinine overnight - will decrease lasix to 40 mg BID starting tonight. Plan for TEE/DCCV today - may be able to be discharged later today.  Time Spent Directly with Patient:  I have spent a total of 25 minutes with the patient reviewing hospital notes, telemetry, EKGs, labs and examining the patient as well as establishing an assessment and plan that  was discussed personally with the patient.  > 50% of time was spent in direct patient care.  Length of Stay:  LOS: 4 days   Pixie Casino, MD, [facility], [place] Director of the [facility]  Card[facility] Diplomate of the American Board of Clinical Lipidology Attending Cardiologist  Direct Dial: [phone]  Fax: [phone]  Website:  [email or web address] Vencent Hauschild 05/06/2021, 8:27 AM

## 2021-05-06 NOTE — Evaluation (Addendum)
Physical Therapy Evaluation Patient Details Name: Randall Wilkins MRN: [id number] DOB: 02/24/1941 Today's Date: 05/06/2021   History of Present Illness  Pt is an 80 y.o. male admitted 5/20 with increased SOB and AFib for planned cardioversion 5/24. PMhx:Afib, HTN, mild AS, CAD s/p CABG, MI, CVA, stage IV non-small cell lung CA (oral chemo), L foot drop, HLD, memory inmpairment  Clinical Impression  Pt pleasant and reports assist of wife at home and son present to confirm. Pt walks home distance only and is currently working with [facility]. Pt and family confirm fall a few months ago but improved stability since that time. Pt with decreased activity tolerance, balance and strength who will benefit from acute therapy to maximize mobility to safely return home. Pt encouraged to ambulate daily with staff.  HR 72-77 SpO2 92-99% on 2L    Follow Up Recommendations Other (comment) (continue current HHPT)    Equipment Recommendations  None recommended by PT    Recommendations for Other Services       Precautions / Restrictions Precautions Precautions: Fall Precaution Comments: left AFO Restrictions Weight Bearing Restrictions: No      Mobility  Bed Mobility               General bed mobility comments: pt in chair on arrival and end of session    Transfers Overall transfer level: Needs assistance   Transfers: Sit to/from Stand Sit to Stand: Min guard         General transfer comment: cues for hand placement, particularly to reach for surface with sitting  Ambulation/Gait Ambulation/Gait assistance: Min guard Gait Distance (Feet): 140 Feet Assistive device: Rolling walker (2 wheeled) Gait Pattern/deviations: Step-through pattern;Decreased stride length;Trunk flexed   Gait velocity interpretation: <1.8 ft/sec, indicate of risk for recurrent falls General Gait Details: pt with slow controlled gait dependent on RW with guarding for safety and assist for O2  Stairs             Wheelchair Mobility    Modified Rankin (Stroke Patients Only)       Balance Overall balance assessment: Needs assistance   Sitting balance-Leahy Scale: Fair     Standing balance support: Bilateral upper extremity supported Standing balance-Leahy Scale: Poor Standing balance comment: reliant on bil UE support in standing                             Pertinent Vitals/Pain Pain Assessment: No/denies pain    Home Living Family/patient expects to be discharged to:: Private residence Living Arrangements: Spouse/significant other Available Help at Discharge: Family;Available 24 hours/day Type of Home: House Home Access: Ramped entrance     Home Layout: One level Home Equipment: Grab bars - tub/shower;Walker - 2 wheels;Hand held shower head;Walker - 4 wheels;Electric scooter;Shower seat - built in;Grab bars - toilet;Transport chair;Adaptive equipment      Prior Function Level of Independence: Needs assistance   Gait / Transfers Assistance Needed: Pt reports using RW for short ambulation distances. Wife reports using transport chair when out of house.  ADL's / Homemaking Assistance Needed: Pt's wife assists with shower transfers and lower body bathing/dressing.        Hand Dominance        Extremity/Trunk Assessment   Upper Extremity Assessment Upper Extremity Assessment: Generalized weakness    Lower Extremity Assessment Lower Extremity Assessment: Generalized weakness (LLE foot drop baseline with AFO)    Cervical / Trunk Assessment Cervical / Trunk Assessment: Kyphotic  Communication   Communication: HOH  Cognition Arousal/Alertness: Awake/alert Behavior During Therapy: Flat affect Overall Cognitive Status: History of cognitive impairments - at baseline                                 General Comments: son present to clarify and confirm PLOF and home assist      General Comments      Exercises     Assessment/Plan     PT Assessment Patient needs continued PT services  PT Problem List Decreased mobility;Decreased activity tolerance;Decreased cognition;Decreased balance       PT Treatment Interventions Gait training;Balance training;Functional mobility training;Therapeutic activities;Patient/family education;Therapeutic exercise;DME instruction    PT Goals (Current goals can be found in the Care Plan section)  Acute Rehab PT Goals Patient Stated Goal: return home and sit on the porch PT Goal Formulation: With patient/family Time For Goal Achievement: 05/20/21 Potential to Achieve Goals: Fair    Frequency Min 3X/week   Barriers to discharge        Co-evaluation               AM-PAC PT "6 Clicks" Mobility  Outcome Measure Help needed turning from your back to your side while in a flat bed without using bedrails?: A Little Help needed moving from lying on your back to sitting on the side of a flat bed without using bedrails?: A Little Help needed moving to and from a bed to a chair (including a wheelchair)?: A Little Help needed standing up from a chair using your arms (e.g., wheelchair or bedside chair)?: A Little Help needed to walk in hospital room?: A Little Help needed climbing 3-5 steps with a railing? : A Lot 6 Click Score: 17    End of Session Equipment Utilized During Treatment: Oxygen;Other (comment) (AFO) Activity Tolerance: Patient tolerated treatment well Patient left: in chair;with call bell/phone within reach;with family/visitor present Nurse Communication: Mobility status PT Visit Diagnosis: Other abnormalities of gait and mobility (R26.89);Difficulty in walking, not elsewhere classified (R26.2)    Time: 5009-3818 PT Time Calculation (min) (ACUTE ONLY): 23 min   Charges:   PT Evaluation $PT Eval Moderate Complexity: 1 Mod          Shafin Pollio P, PT Acute Rehabilitation Services Pager: [phone] Office: [phone]   Clarissa Laird B Loron Weimer 05/06/2021, 11:03 AM

## 2021-05-06 NOTE — Progress Notes (Signed)
DAILY PROGRESS NOTE   Patient Name: Randall Wilkins Date of Encounter: 05/06/2021 Cardiologist: Pixie Casino, MD  Chief Complaint   No complaints  Patient Profile   Randall Wilkins is a 80 y.o. male with a hx of chronic respiratory failure on home O2, hypertension, hyperlipidemia, IDDM, venous insufficiency, OSA not on CPAP therapy, CAD s/p CABG 2016, persistent atrial fibrillation, history of Mobitz 1 heart block, and stage IV non-small cell lung cancer who is being seen 05/02/2021 for the evaluation of dyspnea at the request of Dr. Hal Hope.   Subjective   No issues in the chart overnight - plan for TEE/DCCV today with Dr. Audie Box, unfortunately, due to a missed dose of Eliquis. He remains in rate-controlled flutter. Still net negative 1.4L yesterday. Creatinine is fairly stable.   Objective   Vitals:   05/05/21 2351 05/06/21 0350 05/06/21 0500 05/06/21 0729  BP: 135/62 (!) 114/46    Pulse: 62 67    Resp: 12 20    Temp: 98.4 F (36.9 C) 98.1 F (36.7 C)    TempSrc: Oral Oral    SpO2: 99% 100%  100%  Weight:   99.3 kg     Intake/Output Summary (Last 24 hours) at 05/06/2021 0827 Last data filed at 05/06/2021 0350 Gross per 24 hour  Intake 240 ml  Output 1700 ml  Net -1460 ml   Filed Weights   05/04/21 0222 05/05/21 0334 05/06/21 0500  Weight: 98.8 kg 99 kg 99.3 kg    Physical Exam   General appearance: alert, no distress and pale Neck: JVD - 1 cm above sternal notch, no carotid bruit and thyroid not enlarged, symmetric, no tenderness/mass/nodules Lungs: diminished breath sounds bilaterally Heart: regular rate and rhythm, S1, S2 normal and systolic murmur: systolic ejection 3/6, blowing at 2nd right intercostal space Abdomen: soft, non-tender; bowel sounds normal; no masses,  no organomegaly Extremities: extremities normal, atraumatic, no cyanosis or edema Pulses: 2+ and symmetric Skin: Pale, warm, dry Neurologic: Grossly normal Psych: Pleasant, oriented this  morning  Inpatient Medications    Scheduled Meds: . allopurinol  100 mg Oral Daily  . amitriptyline  75 mg Oral QHS  . amLODipine  10 mg Oral Daily  . apixaban  5 mg Oral BID  . Chlorhexidine Gluconate Cloth  6 each Topical Daily  . furosemide  60 mg Oral BID  . insulin aspart  0-9 Units Subcutaneous TID WC  . insulin glargine  12 Units Subcutaneous QHS  . isosorbide-hydrALAZINE  1 tablet Oral BID  . ketorolac  1 drop Right Eye TID AC & HS  . levothyroxine  75 mcg Oral QAC breakfast  . lisinopril  10 mg Oral Daily  . potassium chloride SA  40 mEq Oral BID  . rosuvastatin  20 mg Oral Daily  . sodium chloride flush  10-40 mL Intracatheter Q12H  . tamsulosin  0.4 mg Oral QHS  . umeclidinium bromide  1 puff Inhalation Daily  . vitamin B-12  1,000 mcg Oral QHS    Continuous Infusions:   PRN Meds: acetaminophen **OR** acetaminophen, albuterol, sodium chloride flush   Labs   Results for orders placed or performed during the hospital encounter of 05/01/21 (from the past 48 hour(s))  Glucose, capillary     Status: Abnormal   Collection Time: 05/04/21 11:00 AM  Result Value Ref Range   Glucose-Capillary 185 (H) 70 - 99 mg/dL    Comment: Glucose reference range applies only to samples taken after fasting for at  least 8 hours.  Glucose, capillary     Status: Abnormal   Collection Time: 05/04/21  4:37 PM  Result Value Ref Range   Glucose-Capillary 164 (H) 70 - 99 mg/dL    Comment: Glucose reference range applies only to samples taken after fasting for at least 8 hours.  Glucose, capillary     Status: Abnormal   Collection Time: 05/04/21  8:45 PM  Result Value Ref Range   Glucose-Capillary 246 (H) 70 - 99 mg/dL    Comment: Glucose reference range applies only to samples taken after fasting for at least 8 hours.  Basic metabolic panel     Status: Abnormal   Collection Time: 05/05/21  3:43 AM  Result Value Ref Range   Sodium 139 135 - 145 mmol/L   Potassium 3.4 (L) 3.5 - 5.1  mmol/L   Chloride 104 98 - 111 mmol/L   CO2 30 22 - 32 mmol/L   Glucose, Bld 142 (H) 70 - 99 mg/dL    Comment: Glucose reference range applies only to samples taken after fasting for at least 8 hours.   BUN 18 8 - 23 mg/dL   Creatinine, Ser 1.58 (H) 0.61 - 1.24 mg/dL   Calcium 8.7 (L) 8.9 - 10.3 mg/dL   GFR, Estimated 44 (L) >60 mL/min    Comment: (NOTE) Calculated using the CKD-EPI Creatinine Equation (2021)    Anion gap 5 5 - 15    Comment: Performed at [facility] [address], [place], [place] [address]  Glucose, capillary     Status: Abnormal   Collection Time: 05/05/21  6:15 AM  Result Value Ref Range   Glucose-Capillary 125 (H) 70 - 99 mg/dL    Comment: Glucose reference range applies only to samples taken after fasting for at least 8 hours.  Glucose, capillary     Status: Abnormal   Collection Time: 05/05/21 11:18 AM  Result Value Ref Range   Glucose-Capillary 198 (H) 70 - 99 mg/dL    Comment: Glucose reference range applies only to samples taken after fasting for at least 8 hours.  Magnesium     Status: None   Collection Time: 05/05/21 12:14 PM  Result Value Ref Range   Magnesium 2.0 1.7 - 2.4 mg/dL    Comment: Performed at [facility] [address] [place], [place] [address]  CBC     Status: Abnormal   Collection Time: 05/05/21 12:14 PM  Result Value Ref Range   WBC 5.5 4.0 - 10.5 K/uL   RBC 3.97 (L) 4.22 - 5.81 MIL/uL   Hemoglobin 11.3 (L) 13.0 - 17.0 g/dL   HCT 37.1 (L) 39.0 - 52.0 %   MCV 93.5 80.0 - 100.0 fL   MCH 28.5 26.0 - 34.0 pg   MCHC 30.5 30.0 - 36.0 g/dL   RDW 15.9 (H) 11.5 - 15.5 %   Platelets 107 (L) 150 - 400 K/uL    Comment: Immature Platelet Fraction may be clinically indicated, consider ordering this additional test [id number] CONSISTENT WITH PREVIOUS RESULT REPEATED TO VERIFY    nRBC 0.0 0.0 - 0.2 %    Comment: Performed at [facility], [place] [address], [place], [place] [address]  Protime-INR     Status: None    Collection Time: 05/05/21 12:14 PM  Result Value Ref Range   Prothrombin Time 14.6 11.4 - 15.2 seconds   INR 1.1 0.8 - 1.2    Comment: (NOTE) INR goal varies based on  device and disease states. Performed at [facility], [place] [address], [place], [place] [address]   Glucose, capillary     Status: Abnormal   Collection Time: 05/05/21  4:34 PM  Result Value Ref Range   Glucose-Capillary 221 (H) 70 - 99 mg/dL    Comment: Glucose reference range applies only to samples taken after fasting for at least 8 hours.   Comment 1 Notify RN    Comment 2 Document in Chart   Glucose, capillary     Status: Abnormal   Collection Time: 05/05/21 10:01 PM  Result Value Ref Range   Glucose-Capillary 162 (H) 70 - 99 mg/dL    Comment: Glucose reference range applies only to samples taken after fasting for at least 8 hours.  CBC     Status: Abnormal   Collection Time: 05/06/21  3:49 AM  Result Value Ref Range   WBC 4.7 4.0 - 10.5 K/uL   RBC 3.42 (L) 4.22 - 5.81 MIL/uL   Hemoglobin 9.9 (L) 13.0 - 17.0 g/dL   HCT 32.3 (L) 39.0 - 52.0 %   MCV 94.4 80.0 - 100.0 fL   MCH 28.9 26.0 - 34.0 pg   MCHC 30.7 30.0 - 36.0 g/dL   RDW 15.8 (H) 11.5 - 15.5 %   Platelets 107 (L) 150 - 400 K/uL    Comment: Immature Platelet Fraction may be clinically indicated, consider ordering this additional test [id number] CONSISTENT WITH PREVIOUS RESULT    nRBC 0.0 0.0 - 0.2 %    Comment: Performed at [facility], [place] [address], [place], [place] [address]  Comprehensive metabolic panel     Status: Abnormal   Collection Time: 05/06/21  3:49 AM  Result Value Ref Range   Sodium 141 135 - 145 mmol/L   Potassium 4.1 3.5 - 5.1 mmol/L   Chloride 104 98 - 111 mmol/L   CO2 31 22 - 32 mmol/L   Glucose, Bld 167 (H) 70 - 99 mg/dL    Comment: Glucose reference range applies only to samples taken after fasting for at least 8 hours.   BUN 21 8 - 23 mg/dL   Creatinine, Ser 1.60 (H) 0.61 - 1.24 mg/dL   Calcium 8.9 8.9 -  10.3 mg/dL   Total Protein 5.4 (L) 6.5 - 8.1 g/dL   Albumin 3.1 (L) 3.5 - 5.0 g/dL   AST 15 15 - 41 U/L   ALT 10 0 - 44 U/L   Alkaline Phosphatase 69 38 - 126 U/L   Total Bilirubin 0.5 0.3 - 1.2 mg/dL   GFR, Estimated 43 (L) >60 mL/min    Comment: (NOTE) Calculated using the CKD-EPI Creatinine Equation (2021)    Anion gap 6 5 - 15    Comment: Performed at [facility], [place] [address], [place], [place] [address]  Glucose, capillary     Status: Abnormal   Collection Time: 05/06/21  5:56 AM  Result Value Ref Range   Glucose-Capillary 137 (H) 70 - 99 mg/dL    Comment: Glucose reference range applies only to samples taken after fasting for at least 8 hours.    ECG   N/A  Telemetry   Atrial flutter with 5:1 conduction - Personally Reviewed  Radiology    ECHOCARDIOGRAM LIMITED  Result Date: 05/04/2021    ECHOCARDIOGRAM LIMITED REPORT   Patient Name:   Randall Wilkins Date of Exam: 05/04/2021 Medical Rec #:  [id number]      Height:  67.0 in Accession #:    [id number]     Weight:       217.8 lb Date of Birth:  Feb 27, 1941      BSA:          2.097 m Patient Age:    71 years       BP:           160/65 mmHg Patient Gender: M              HR:           58 bpm. Exam Location:  Inpatient Procedure: Limited Echo, Limited Color Doppler and Cardiac Doppler Indications:    aortic stenosis. acute diastolic chf.  History:        Patient has prior history of Echocardiogram examinations, most                 recent 02/27/2021. CHF, chronic kidney disease, Aortic Valve                 Disease, Arrythmias:Atrial Flutter; Risk Factors:Sleep Apnea,                 Hypertension and Dyslipidemia.  Sonographer:    Johny Chess Referring Phys: [place]  1. Left ventricular ejection fraction, by estimation, is 45 to 50%. Left ventricular ejection fraction by PLAX is 45 %. The left ventricle has mildly decreased function. There is mild left ventricular hypertrophy. Left ventricular  diastolic function could not be evaluated.  2. The aortic valve is tricuspid. Moderate aortic valve stenosis. Aortic valve area, by VTI measures 1.18 cm. Aortic valve mean gradient measures 17.5 mmHg. Aortic valve Vmax measures 2.82 m/s. DI is 0.38.  3. There is severely elevated pulmonary artery systolic pressure.  4. Tricuspid valve regurgitation is mild to moderate.  5. The inferior vena cava is normal in size with <50% respiratory variability, suggesting right atrial pressure of 8 mmHg. Comparison(s): Changes from prior study are noted. 02/27/2021: LVEF 40-45%, mild AS - mean gradient 16 mmHg. FINDINGS  Left Ventricle: Left ventricular ejection fraction, by estimation, is 45 to 50%. Left ventricular ejection fraction by PLAX is 45 %. The left ventricle has mildly decreased function. The left ventricular internal cavity size was normal in size. There is  mild left ventricular hypertrophy. Left ventricular diastolic function could not be evaluated. Left ventricular diastolic function could not be evaluated due to atrial fibrillation. Right Ventricle: There is severely elevated pulmonary artery systolic pressure. The tricuspid regurgitant velocity is 4.21 m/s, and with an assumed right atrial pressure of 8 mmHg, the estimated right ventricular systolic pressure is 44.9 mmHg. Tricuspid Valve: The tricuspid valve is grossly normal. Tricuspid valve regurgitation is mild to moderate. Aortic Valve: The aortic valve is tricuspid. Moderate aortic stenosis is present. Aortic valve mean gradient measures 17.5 mmHg. Aortic valve peak gradient measures 31.8 mmHg. Aortic valve area, by VTI measures 1.18 cm. Venous: The inferior vena cava is normal in size with less than 50% respiratory variability, suggesting right atrial pressure of 8 mmHg. EKG: Rhythm strip during this exam demostrated atrial flutter. LEFT VENTRICLE PLAX 2D LV EF:         Left ventricular ejection fraction by PLAX is 45 %. LVIDd:         4.90 cm LVIDs:          3.80 cm LV PW:         1.10 cm LV IVS:  1.00 cm LVOT diam:     2.00 cm LV SV:         72 LV SV Index:   34 LVOT Area:     3.14 cm  IVC IVC diam: 1.90 cm LEFT ATRIUM         Index LA diam:    5.00 cm 2.38 cm/m  AORTIC VALVE AV Area (Vmax):    1.10 cm AV Area (Vmean):   1.09 cm AV Area (VTI):     1.18 cm AV Vmax:           282.00 cm/s AV Vmean:          191.500 cm/s AV VTI:            0.608 m AV Peak Grad:      31.8 mmHg AV Mean Grad:      17.5 mmHg LVOT Vmax:         98.95 cm/s LVOT Vmean:        66.450 cm/s LVOT VTI:          0.229 m LVOT/AV VTI ratio: 0.38 TRICUSPID VALVE TR Peak grad:   70.9 mmHg TR Vmax:        421.00 cm/s  SHUNTS Systemic VTI:  0.23 m Systemic Diam: 2.00 cm Lyman Bishop MD Electronically signed by Lyman Bishop MD Signature Date/Time: 05/04/2021/12:31:32 PM    Final     Cardiac Studies   N/A  Assessment   Principal Problem:   CHF (congestive heart failure) (HCC) Active Problems:   Hyperlipidemia   CAD S/P percutaneous coronary angioplasty   Type 1 diabetes mellitus with nephropathy (HCC)   Thrombocytopenia (HCC)   Obstructive sleep apnea   Chronic diastolic congestive heart failure (HCC)   Chronic kidney disease (CKD), stage III (moderate) (HCC)   S/P CABG x 2   Essential hypertension   Adenocarcinoma of right lung, stage 4 (HCC)   CAP (community acquired pneumonia)   Acute on chronic combined systolic and diastolic CHF (congestive heart failure) (HCC)   Typical atrial flutter ([place])   Plan   1. [name] appears to well compensated at this point - he was another 1.5L negative with slight increase in creatinine overnight - will decrease lasix to 40 mg BID starting tonight. Plan for TEE/DCCV today - may be able to be discharged later today.  Time Spent Directly with Patient:  I have spent a total of 25 minutes with the patient reviewing hospital notes, telemetry, EKGs, labs and examining the patient as well as establishing an assessment and plan that  was discussed personally with the patient.  > 50% of time was spent in direct patient care.  Length of Stay:  LOS: 4 days   Pixie Casino, MD, [facility], [place] Director of the [facility] Lipid Disorders &  Cardiovascular Risk Reduction Clinic Diplomate of the American Board of Clinical Lipidology Attending Cardiologist  Direct Dial: [phone]  Fax: [phone]  Website:  [email or web address] 05/06/2021, 8:27 AM

## 2021-05-06 NOTE — CV Procedure (Signed)
   TRANSESOPHAGEAL ECHOCARDIOGRAM GUIDED DIRECT CURRENT CARDIOVERSION  NAME:  Randall Wilkins    MRN: [id number] DOB:  04-Feb-1941    ADMIT DATE: 05/01/2021  INDICATIONS: Symptomatic atrial flutter  PROCEDURE:   Informed consent was obtained prior to the procedure. The risks, benefits and alternatives for the procedure were discussed and the patient comprehended these risks.  Risks include, but are not limited to, cough, sore throat, vomiting, nausea, somnolence, esophageal and stomach trauma or perforation, bleeding, low blood pressure, aspiration, pneumonia, infection, trauma to the teeth and death.    After a procedural time-out, the oropharynx was anesthetized and the patient was sedated by the anesthesia service. The transesophageal probe was inserted in the esophagus and stomach without difficulty and multiple views were obtained. Anesthesia was monitored by Dr. Marcie Bal.   COMPLICATIONS:    Complications: No complications Patient tolerated procedure well.  KEY FINDINGS:  1. No LAA thrombus.  2. Moderate AS. 3. Normal LV/RV function.  4. Full Report to follow.   CARDIOVERSION:     Indications:  Symptomatic Atrial Flutter  Procedure Details:  Once the TEE was complete, the patient had the defibrillator pads placed in the anterior and posterior position. Once an appropriate level of sedation was confirmed, the patient was cardioverted x 1 with 120J of biphasic synchronized energy.  The patient converted to NSR.  There were no apparent complications.  The patient had normal neuro status and respiratory status post procedure with vitals stable as recorded elsewhere.  Adequate airway was maintained throughout and vital signs monitored per protocol.  [name], [place]  [address] [place], [place] [address] [phone]  1:42 PM

## 2021-05-06 NOTE — Interval H&P Note (Signed)
History and Physical Interval Note:  05/06/2021 12:58 PM  Randall Wilkins  has presented today for surgery, with the diagnosis of A flutter.  The various methods of treatment have been discussed with the patient and family. After consideration of risks, benefits and other options for treatment, the patient has consented to  Procedure(s): TRANSESOPHAGEAL ECHOCARDIOGRAM (TEE) (N/A) CARDIOVERSION (N/A) as a surgical intervention.  The patient's history has been reviewed, patient examined, no change in status, stable for surgery.  I have reviewed the patient's chart and labs.  Questions were answered to the patient's satisfaction.     TEE/DCCV for atrial flutter. NPO since midnight. On eliquis.   [name], MD, [place]  [address] [place], [place] [address] [phone]  12:59 PM

## 2021-05-06 NOTE — Progress Notes (Signed)
Progress Note    Randall Wilkins  NGE:[id number] DOB: [birth year]  DOA: 05/01/2021 PCP: [name], MD    Brief Narrative:     Medical records reviewed and are as summarized below:  Randall Wilkins is an 80 y.o. male withhistory of chronic systolic heart failure, paroxysmal atrial fibrillation, CAD s/p CABG, hypertension, diabetes mellitus and non-small cell lung cancer stage IV being followed by Dr. Julien Nordmann oncologist presents to the ER because of increasing shortness .  Here with CHF exacerbation and a flutter.  S/p cardioversion  Assessment/Plan:   Principal Problem:   CHF (congestive heart failure) (HCC) Active Problems:   Hyperlipidemia   CAD S/P percutaneous coronary angioplasty   Type 1 diabetes mellitus with nephropathy (HCC)   Thrombocytopenia (HCC)   Obstructive sleep apnea   Chronic diastolic congestive heart failure (HCC)   Chronic kidney disease (CKD), stage III (moderate) (HCC)   S/P CABG x 2   Essential hypertension   Adenocarcinoma of right lung, stage 4 (HCC)   CAP (community acquired pneumonia)   Acute on chronic combined systolic and diastolic CHF (congestive heart failure) (HCC)   Typical atrial flutter (HCC)     acute on chronic combined diastolic and systolic heart failure-he reports improvement with diuresis. Lasix switched to p.o.  On BiDil, lisinopril Echo: ejection fraction 45 to 50%.  Elevated pulmonary artery systolic pressure.  Moderate tricuspid regurgitation and moderate aortic valve stenosis.  Atrial flutter -s/p cardioversion and TEE (no clot)  CKD stage IIIa -stable  pancytopenia likely secondary to immunotherapy for malignancy.  history of type 2 diabetes-on ssi and Lantus  chronic resp failure -on 2L  mild hypokalemia  -replete  obesity Body mass index is 34.29 kg/m.   Family Communication/Anticipated D/C date and plan/Code Status   DVT prophylaxis: eliquis Code Status: Full Code.  Family Communication:  wife at bedside Disposition Plan: Status is: Inpatient  Remains inpatient appropriate because:Inpatient level of care appropriate due to severity of illness   Dispo: The patient is from: Home              Anticipated d/c is to: Home              Patient currently is not medically stable to d/c. home in AM?  Defer to cards   Difficult to place patient No         Medical Consultants:    cards  Subjective:   No complaints  Objective:    Vitals:   05/06/21 1242 05/06/21 1351 05/06/21 1400 05/06/21 1410  BP: (!) 140/54 (!) 106/44 (!) 112/41 (!) 131/43  Pulse: (!) 158 78 65 62  Resp: (!) 21 (!) 25 (!) 27 (!) 22  Temp:  97.7 F (36.5 C)    TempSrc:  Oral    SpO2: 100% 99% 97% 100%  Weight:        Intake/Output Summary (Last 24 hours) at 05/06/2021 1507 Last data filed at 05/06/2021 0350 Gross per 24 hour  Intake 240 ml  Output 1700 ml  Net -1460 ml   Filed Weights   05/04/21 0222 05/05/21 0334 05/06/21 0500  Weight: 98.8 kg 99 kg 99.3 kg    Exam:  General: Appearance:    Obese male in no acute distress     Lungs:     respirations unlabored  Heart:    Normal heart rate. Normal rhythm.  This AM was irregular on exam  MS:   All extremities are intact.   Neurologic:  Awake, pleasantly confused    Data Reviewed:   I have personally reviewed following labs and imaging studies:  Labs: Labs show the following:   Basic Metabolic Panel: Recent Labs  Lab 05/01/21 1618 05/03/21 0700 05/04/21 0237 05/05/21 0343 05/05/21 1214 05/06/21 0349  NA 139 142 141 139  --  141  K 4.5 3.5 3.5 3.4*  --  4.1  CL 105 106 102 104  --  104  CO2 26 30 30 30   --  31  GLUCOSE 248* 119* 122* 142*  --  167*  BUN 20 16 18 18   --  21  CREATININE 1.54* 1.48* 1.51* 1.58*  --  1.60*  CALCIUM 9.0 8.8* 8.8* 8.7*  --  8.9  MG  --   --   --   --  2.0  --    GFR Estimated Creatinine Clearance: 41.4 mL/min (A) (by C-G formula based on SCr of 1.6 mg/dL (H)). Liver Function  Tests: Recent Labs  Lab 05/01/21 1618 05/06/21 0349  AST 20 15  ALT 11 10  ALKPHOS 72 69  BILITOT 1.0 0.5  PROT 6.3* 5.4*  ALBUMIN 3.6 3.1*   No results for input(s): LIPASE, AMYLASE in the last 168 hours. No results for input(s): AMMONIA in the last 168 hours. Coagulation profile Recent Labs  Lab 05/05/21 1214  INR 1.1    CBC: Recent Labs  Lab 05/01/21 1618 05/05/21 1214 05/06/21 0349  WBC 3.5* 5.5 4.7  NEUTROABS 2.2  --   --   HGB 11.0* 11.3* 9.9*  HCT 35.9* 37.1* 32.3*  MCV 95.2 93.5 94.4  PLT 126* 107* 107*   Cardiac Enzymes: No results for input(s): CKTOTAL, CKMB, CKMBINDEX, TROPONINI in the last 168 hours. BNP (last 3 results) No results for input(s): PROBNP in the last 8760 hours. CBG: Recent Labs  Lab 05/05/21 1118 05/05/21 1634 05/05/21 2201 05/06/21 0556 05/06/21 1125  GLUCAP 198* 221* 162* 137* 106*   D-Dimer: No results for input(s): DDIMER in the last 72 hours. Hgb A1c: No results for input(s): HGBA1C in the last 72 hours. Lipid Profile: No results for input(s): CHOL, HDL, LDLCALC, TRIG, CHOLHDL, LDLDIRECT in the last 72 hours. Thyroid function studies: No results for input(s): TSH, T4TOTAL, T3FREE, THYROIDAB in the last 72 hours.  Invalid input(s): FREET3 Anemia work up: No results for input(s): VITAMINB12, FOLATE, FERRITIN, TIBC, IRON, RETICCTPCT in the last 72 hours. Sepsis Labs: Recent Labs  Lab 05/01/21 1618 05/02/21 0200 05/05/21 1214 05/06/21 0349  PROCALCITON  --  <0.10  --   --   WBC 3.5*  --  5.5 4.7    Microbiology Recent Results (from the past 240 hour(s))  Resp Panel by RT-PCR (Flu A&B, Covid) Nasopharyngeal Swab     Status: None   Collection Time: 05/01/21  4:21 PM   Specimen: Nasopharyngeal Swab; Nasopharyngeal(NP) swabs in vial transport medium  Result Value Ref Range Status   SARS Coronavirus 2 by RT PCR NEGATIVE NEGATIVE Final    Comment: (NOTE) SARS-CoV-2 target nucleic acids are NOT DETECTED.  The  SARS-CoV-2 RNA is generally detectable in upper respiratory specimens during the acute phase of infection. The lowest concentration of SARS-CoV-2 viral copies this assay can detect is 138 copies/mL. A negative result does not preclude SARS-Cov-2 infection and should not be used as the sole basis for treatment or other patient management decisions. A negative result may occur with  improper specimen collection/handling, submission of specimen other than nasopharyngeal swab, presence of viral mutation(s)  within the areas targeted by this assay, and inadequate number of viral copies(<138 copies/mL). A negative result must be combined with clinical observations, patient history, and epidemiological information. The expected result is Negative.  Fact Sheet for Patients:  EntrepreneurPulse.com.au  Fact Sheet for Healthcare Providers:  IncredibleEmployment.be  This test is no t yet approved or cleared by the [place] FDA and  has been authorized for detection and/or diagnosis of SARS-CoV-2 by FDA under an Emergency Use Authorization (EUA). This EUA will remain  in effect (meaning this test can be used) for the duration of the COVID-19 declaration under Section 564(b)(1) of the Act, 21 U.S.C.section 360bbb-3(b)(1), unless the authorization is terminated  or revoked sooner.       Influenza A by PCR NEGATIVE NEGATIVE Final   Influenza B by PCR NEGATIVE NEGATIVE Final    Comment: (NOTE) The Xpert Xpress SARS-CoV-2/FLU/RSV plus assay is intended as an aid in the diagnosis of influenza from Nasopharyngeal swab specimens and should not be used as a sole basis for treatment. Nasal washings and aspirates are unacceptable for Xpert Xpress SARS-CoV-2/FLU/RSV testing.  Fact Sheet for Patients: EntrepreneurPulse.com.au  Fact Sheet for Healthcare Providers: IncredibleEmployment.be  This test is not yet approved or  cleared by the [place] FDA and has been authorized for detection and/or diagnosis of SARS-CoV-2 by FDA under an Emergency Use Authorization (EUA). This EUA will remain in effect (meaning this test can be used) for the duration of the COVID-19 declaration under Section 564(b)(1) of the Act, 21 U.S.C. section 360bbb-3(b)(1), unless the authorization is terminated or revoked.  Performed at [facility], [place] [address] [place], [place] [address]   Blood culture (routine x 2)     Status: None (Preliminary result)   Collection Time: 05/01/21 10:50 PM   Specimen: BLOOD  Result Value Ref Range Status   Specimen Description BLOOD LEFT ANTECUBITAL  Final   Special Requests   Final    BOTTLES DRAWN AEROBIC AND ANAEROBIC Blood Culture adequate volume   Culture   Final    NO GROWTH 4 DAYS Performed at [facility], [place] [address], [place], [place] [address]    Report Status PENDING  Incomplete  MRSA PCR Screening     Status: None   Collection Time: 05/02/21  2:56 PM   Specimen: Nasopharyngeal  Result Value Ref Range Status   MRSA by PCR NEGATIVE NEGATIVE Final    Comment:        The GeneXpert MRSA Assay (FDA approved for NASAL specimens only), is one component of a comprehensive MRSA colonization surveillance program. It is not intended to diagnose MRSA infection nor to guide or monitor treatment for MRSA infections. Performed at [facility], [place] [address], [place], [place] [address]     Procedures and diagnostic studies:  No results found.  Medications:   . allopurinol  100 mg Oral Daily  . amitriptyline  75 mg Oral QHS  . amLODipine  10 mg Oral Daily  . apixaban  5 mg Oral BID  . Chlorhexidine Gluconate Cloth  6 each Topical Daily  . furosemide  40 mg Oral BID  . insulin aspart  0-9 Units Subcutaneous TID WC  . insulin glargine  12 Units Subcutaneous QHS  . isosorbide-hydrALAZINE  1 tablet Oral BID  . ketorolac  1 drop Right Eye TID AC & HS  .  levothyroxine  75 mcg Oral QAC breakfast  . lisinopril  10 mg Oral Daily  .  potassium chloride SA  40 mEq Oral BID  . rosuvastatin  20 mg Oral Daily  . sodium chloride flush  10-40 mL Intracatheter Q12H  . tamsulosin  0.4 mg Oral QHS  . umeclidinium bromide  1 puff Inhalation Daily  . vitamin B-12  1,000 mcg Oral QHS   Continuous Infusions:   LOS: 4 days   Geradine Girt  [facility]   How to contact the [facility] Attending or Consulting provider [place] or covering provider during after hours [place], for this patient?  1. Check the care team in [facility] and look for a) attending/consulting TR[facility] provider listed and b) the [facility] team listed 2. Log into [email or web address] and use [place]'s universal password to access. If you do not have the password, please contact the hospital operator. 3. Locate the [facility] provider you are looking for under [facility] and page to a number that you can be directly reached. 4. If you still have difficulty reaching the provider, please page the [facility] (Director on Call) for the Hospitalists listed on amion for assistance.  05/06/2021, 3:07 PM

## 2021-05-06 NOTE — Progress Notes (Signed)
RT note. Patient placed on auto cpap 18/5 with 2L bled in, patient sat 98% w/ stable VS. RT will continue to monitor.

## 2021-05-06 NOTE — Anesthesia Preprocedure Evaluation (Addendum)
Anesthesia Evaluation  Patient identified by MRN, date of birth, ID band Patient awake    Reviewed: Allergy & Precautions, H&P , NPO status , Patient's Chart, lab work & pertinent test results  Airway Mallampati: II   Neck ROM: full    Dental  (+) Dental Advisory Given, Caps   Pulmonary shortness of breath, sleep apnea , former smoker,    breath sounds clear to auscultation       Cardiovascular hypertension, + CAD, + CABG and +CHF   Rhythm:regular Rate:Normal     Neuro/Psych CVA    GI/Hepatic PUD,   Endo/Other  diabetes, Type 2Hypothyroidism   Renal/GU Renal InsufficiencyRenal disease     Musculoskeletal   Abdominal   Peds  Hematology  (+) anemia ,   Anesthesia Other Findings   Reproductive/Obstetrics                            Anesthesia Physical Anesthesia Plan  ASA: III  Anesthesia Plan: MAC   Post-op Pain Management:    Induction: Intravenous  PONV Risk Score and Plan: 1 and Propofol infusion and Treatment may vary due to age or medical condition  Airway Management Planned: Nasal Cannula  Additional Equipment:   Intra-op Plan:   Post-operative Plan:   Informed Consent: I have reviewed the patients History and Physical, chart, labs and discussed the procedure including the risks, benefits and alternatives for the proposed anesthesia with the patient or authorized representative who has indicated his/her understanding and acceptance.     Dental advisory given  Plan Discussed with: CRNA, Anesthesiologist and Surgeon  Anesthesia Plan Comments:         Anesthesia Quick Evaluation

## 2021-05-06 NOTE — Transfer of Care (Signed)
Immediate Anesthesia Transfer of Care Note  Patient: Randall Wilkins  Procedure(s) Performed: TRANSESOPHAGEAL ECHOCARDIOGRAM (TEE) (N/A ) CARDIOVERSION (N/A )  Patient Location: Endoscopy Unit  Anesthesia Type:MAC  Level of Consciousness: oriented, drowsy and patient cooperative  Airway & Oxygen Therapy: Patient Spontanous Breathing and Patient connected to nasal cannula oxygen  Post-op Assessment: Report given to RN and Post -op Vital signs reviewed and stable  Post vital signs: Reviewed  Last Vitals:  Vitals Value Taken Time  BP 106/44 05/06/21 1351  Temp 36.5 C 05/06/21 1351  Pulse 57 05/06/21 1355  Resp 25 05/06/21 1355  SpO2 97 % 05/06/21 1355  Vitals shown include unvalidated device data.  Last Pain:  Vitals:   05/06/21 1351  TempSrc: Oral  PainSc: 0-No pain         Complications: No complications documented.

## 2021-05-06 NOTE — Care Management Important Message (Signed)
Important Message  Patient Details  Name: Randall Wilkins MRN: [id number] Date of Birth: 16-Oct-1941   Medicare Important Message Given:  Yes     Allyn Bartelson [address] 05/06/2021, 3:56 PM

## 2021-05-07 ENCOUNTER — Telehealth: Payer: Self-pay | Admitting: Internal Medicine

## 2021-05-07 DIAGNOSIS — C3491 Malignant neoplasm of unspecified part of right bronchus or lung: Secondary | ICD-10-CM | POA: Diagnosis not present

## 2021-05-07 DIAGNOSIS — I483 Typical atrial flutter: Secondary | ICD-10-CM | POA: Diagnosis not present

## 2021-05-07 DIAGNOSIS — I5021 Acute systolic (congestive) heart failure: Secondary | ICD-10-CM | POA: Diagnosis not present

## 2021-05-07 LAB — GLUCOSE, CAPILLARY
Glucose-Capillary: 139 mg/dL — ABNORMAL HIGH (ref 70–99)
Glucose-Capillary: 263 mg/dL — ABNORMAL HIGH (ref 70–99)

## 2021-05-07 LAB — BASIC METABOLIC PANEL
Anion gap: 9 (ref 5–15)
BUN: 23 mg/dL (ref 8–23)
CO2: 29 mmol/L (ref 22–32)
Calcium: 9.2 mg/dL (ref 8.9–10.3)
Chloride: 104 mmol/L (ref 98–111)
Creatinine, Ser: 1.57 mg/dL — ABNORMAL HIGH (ref 0.61–1.24)
GFR, Estimated: 44 mL/min — ABNORMAL LOW (ref >60)
Glucose, Bld: 154 mg/dL — ABNORMAL HIGH (ref 70–99)
Potassium: 4.3 mmol/L (ref 3.5–5.1)
Sodium: 142 mmol/L (ref 135–145)

## 2021-05-07 LAB — CULTURE, BLOOD (ROUTINE X 2)
Culture: NO GROWTH
Special Requests: ADEQUATE

## 2021-05-07 MED ORDER — ISOSORB DINITRATE-HYDRALAZINE 20-37.5 MG PO TABS: 1.0000 | ORAL_TABLET | Freq: Two times a day (BID) | ORAL | 2 refills | Status: DC

## 2021-05-07 MED ORDER — HEPARIN SOD (PORK) LOCK FLUSH 100 UNIT/ML IV SOLN
500.0000 [IU] | INTRAVENOUS | Status: AC | PRN
  Administered 2021-05-07: 500 [IU]
  Filled 2021-05-07: qty 5

## 2021-05-07 MED ORDER — TRESIBA FLEXTOUCH 200 UNIT/ML ~~LOC~~ SOPN: 12.0000 [IU] | PEN_INJECTOR | Freq: Every day | SUBCUTANEOUS | Status: DC

## 2021-05-07 MED ORDER — FUROSEMIDE 20 MG PO TABS: 40.0000 mg | ORAL_TABLET | Freq: Two times a day (BID) | ORAL | 2 refills | Status: DC

## 2021-05-07 NOTE — Consult Note (Signed)
   [facility] Inpatient Consult   05/07/2021  Coley S [place] 1941/03/13 [id number]  [facility] [ACO] Patient: Sherre Poot [facility]   Primary Care Provider:  Dr. [name], [facility] Physicians are listed to provide the [facility]   Patient screened for hospital transition with noted high risk score for unplanned readmission risk and  to assess for potential [place] Management service needs for post hospital transition.  Review of patient's medical record reveals patient is for home with Home Health PT.   Plan:  For home, no needs assessed at this time. TOC is to be done by [facility] provider for calls and follow up appointments.  For questions contact:   Natividad Brood, RN BSN [place] [facility] Liaison  [phone] business mobile phone Toll free office [phone]  Fax number: [phone] [email or web address]

## 2021-05-07 NOTE — Progress Notes (Signed)
Physical Therapy Treatment Patient Details Name: Randall Wilkins MRN: [id number] DOB: 1941/11/28 Today's Date: 05/07/2021    History of Present Illness Pt is an 80 y.o. male admitted 5/20 with increased SOB and AFib, underwent cardioversion 5/24. PMhx:Afib, HTN, mild AS, CAD s/p CABG, MI, CVA, stage IV non-small cell lung CA (oral chemo), L foot drop, HLD, memory inmpairment    PT Comments    Pt tolerates treatment well, reporting mobility feels close to baseline. Pt is able to ambulate for increased distances and at a supervision level currently. Pt will continue to benefit from acute PT services, as well as home health PT services, to further increase activity tolerance.   Follow Up Recommendations  Other (comment) (continue HHPT)     Equipment Recommendations  None recommended by PT    Recommendations for Other Services       Precautions / Restrictions Precautions Precautions: Fall Precaution Comments: left AFO Restrictions Weight Bearing Restrictions: No    Mobility  Bed Mobility               General bed mobility comments: received and left in recliner    Transfers Overall transfer level: Needs assistance Equipment used: Rolling walker (2 wheeled) Transfers: Sit to/from Stand Sit to Stand: Supervision            Ambulation/Gait Ambulation/Gait assistance: Supervision Gait Distance (Feet): 200 Feet Assistive device: Rolling walker (2 wheeled) Gait Pattern/deviations: Step-through pattern Gait velocity: reduced Gait velocity interpretation: 1.31 - 2.62 ft/sec, indicative of limited community ambulator General Gait Details: pt with slowed step-through gait, reduced gait speed, increased time for turns   Marine scientist [place] (Stroke Patients Only)       Balance Overall balance assessment: Needs assistance Sitting-balance support: No upper extremity supported;Feet supported Sitting balance-Leahy Scale:  Fair     Standing balance support: No upper extremity supported;Single extremity supported;Bilateral upper extremity supported Standing balance-Leahy Scale: Poor Standing balance comment: reliant on UE support for dynamic balance                            Cognition Arousal/Alertness: Awake/alert Behavior During Therapy: WFL for tasks assessed/performed Overall Cognitive Status: History of cognitive impairments - at baseline                                 General Comments: pt follows one step commands well, agreeable to session. Answers questions appropriately at this time      Exercises      General Comments General comments (skin integrity, edema, etc.): pt on 3L [place] for mobility, 2.5L at rest. VSS with activity      Pertinent Vitals/Pain Pain Assessment: No/denies pain    Home Living                      Prior Function            PT Goals (current goals can now be found in the care plan section) Acute Rehab PT Goals Patient Stated Goal: return home and sit on the porch Progress towards PT goals: Progressing toward goals    Frequency    Min 3X/week      PT Plan Current plan remains appropriate    Co-evaluation  AM-PAC PT "6 Clicks" Mobility   Outcome Measure  Help needed turning from your back to your side while in a flat bed without using bedrails?: A Little Help needed moving from lying on your back to sitting on the side of a flat bed without using bedrails?: A Little Help needed moving to and from a bed to a chair (including a wheelchair)?: A Little Help needed standing up from a chair using your arms (e.g., wheelchair or bedside chair)?: A Little Help needed to walk in hospital room?: A Little Help needed climbing 3-5 steps with a railing? : A Lot 6 Click Score: 17    End of Session Equipment Utilized During Treatment: Oxygen Activity Tolerance: Patient tolerated treatment well Patient left: in  chair;with call bell/phone within reach;with family/visitor present Nurse Communication: Mobility status PT Visit Diagnosis: Other abnormalities of gait and mobility (R26.89);Difficulty in walking, not elsewhere classified (R26.2)     Time: 3716-9678 PT Time Calculation (min) (ACUTE ONLY): 15 min  Charges:  $Therapeutic Activity: 8-22 mins                     [name], PT, DPT Acute Rehabilitation Pager: [phone]    [name] 05/07/2021, 11:57 AM

## 2021-05-07 NOTE — Progress Notes (Signed)
DAILY PROGRESS NOTE   Patient Name: Randall Wilkins Date of Encounter: 05/07/2021 Cardiologist: Pixie Casino, MD  Chief Complaint   No complaints  Patient Profile   Randall Wilkins is a 80 y.o. male with a hx of chronic respiratory failure on home O2, hypertension, hyperlipidemia, IDDM, venous insufficiency, OSA not on CPAP therapy, CAD s/p CABG 2016, persistent atrial fibrillation, history of Mobitz 1 heart block, and stage IV non-small cell lung cancer who is being seen 05/02/2021 for the evaluation of dyspnea at the request of Dr. Hal Hope.   Subjective   Successful DCCV yesterday from flutter to reportedly sinus rhythm, although my review of the EKG demonstrates afib - no discerable P waves. He has a history of long 1st degree AVB and junctional rhythm in the past as well. He is negative negative 1kg, another 637 cc overnight - lasix reduced to 40 mg po BID.  Creatinine improved to 1.57.  Objective   Vitals:   05/06/21 1535 05/06/21 2001 05/06/21 2200 05/07/21 0402  BP: 140/60 133/64 (!) 115/58 133/62  Pulse: 60 66  65  Resp: 14 12 20 20   Temp: 97.6 F (36.4 C) 97.6 F (36.4 C) (!) 97.2 F (36.2 C) (!) 97.4 F (36.3 C)  TempSrc: Oral Oral Oral Axillary  SpO2: 95% 99%  93%  Weight:    98.4 kg    Intake/Output Summary (Last 24 hours) at 05/07/2021 2778 Last data filed at 05/07/2021 0300 Gross per 24 hour  Intake 62.33 ml  Output 700 ml  Net -637.67 ml   Filed Weights   05/05/21 0334 05/06/21 0500 05/07/21 0402  Weight: 99 kg 99.3 kg 98.4 kg    Physical Exam   General appearance: alert, no distress and pale Neck: no carotid bruit, no JVD and thyroid not enlarged, symmetric, no tenderness/mass/nodules Lungs: diminished breath sounds bilaterally Heart: regular rate and rhythm, S1, S2 normal and systolic murmur: systolic ejection 3/6, blowing at 2nd right intercostal space Abdomen: soft, non-tender; bowel sounds normal; no masses,  no organomegaly Extremities:  extremities normal, atraumatic, no cyanosis or edema Pulses: 2+ and symmetric Skin: Pale, warm, dry Neurologic: Grossly normal Psych: Pleasant, oriented this morning  Inpatient Medications    Scheduled Meds: . allopurinol  100 mg Oral Daily  . amitriptyline  75 mg Oral QHS  . amLODipine  10 mg Oral Daily  . apixaban  5 mg Oral BID  . Chlorhexidine Gluconate Cloth  6 each Topical Daily  . furosemide  40 mg Oral BID  . insulin aspart  0-9 Units Subcutaneous TID WC  . insulin glargine  12 Units Subcutaneous QHS  . isosorbide-hydrALAZINE  1 tablet Oral BID  . ketorolac  1 drop Right Eye TID AC & HS  . levothyroxine  75 mcg Oral QAC breakfast  . lisinopril  10 mg Oral Daily  . potassium chloride SA  40 mEq Oral BID  . rosuvastatin  20 mg Oral Daily  . sodium chloride flush  10-40 mL Intracatheter Q12H  . tamsulosin  0.4 mg Oral QHS  . umeclidinium bromide  1 puff Inhalation Daily  . vitamin B-12  1,000 mcg Oral QHS    Continuous Infusions:   PRN Meds: acetaminophen **OR** acetaminophen, albuterol, sodium chloride flush   Labs   Results for orders placed or performed during the hospital encounter of 05/01/21 (from the past 48 hour(s))  Glucose, capillary     Status: Abnormal   Collection Time: 05/05/21 11:18 AM  Result Value  Ref Range   Glucose-Capillary 198 (H) 70 - 99 mg/dL    Comment: Glucose reference range applies only to samples taken after fasting for at least 8 hours.  Magnesium     Status: None   Collection Time: 05/05/21 12:14 PM  Result Value Ref Range   Magnesium 2.0 1.7 - 2.4 mg/dL    Comment: Performed at [facility] [address] [place], [place] [address]  CBC     Status: Abnormal   Collection Time: 05/05/21 12:14 PM  Result Value Ref Range   WBC 5.5 4.0 - 10.5 K/uL   RBC 3.97 (L) 4.22 - 5.81 MIL/uL   Hemoglobin 11.3 (L) 13.0 - 17.0 g/dL   HCT 37.1 (L) 39.0 - 52.0 %   MCV 93.5 80.0 - 100.0 fL   MCH 28.5 26.0 - 34.0 pg   MCHC 30.5 30.0 -  36.0 g/dL   RDW 15.9 (H) 11.5 - 15.5 %   Platelets 107 (L) 150 - 400 K/uL    Comment: Immature Platelet Fraction may be clinically indicated, consider ordering this additional test [id number] CONSISTENT WITH PREVIOUS RESULT REPEATED TO VERIFY    nRBC 0.0 0.0 - 0.2 %    Comment: Performed at [facility], [place] [address] [place], [place] [address]  Protime-INR     Status: None   Collection Time: 05/05/21 12:14 PM  Result Value Ref Range   Prothrombin Time 14.6 11.4 - 15.2 seconds   INR 1.1 0.8 - 1.2    Comment: (NOTE) INR goal varies based on device and disease states. Performed at [facility], [place] [address], [place], [place] [address]   Glucose, capillary     Status: Abnormal   Collection Time: 05/05/21  4:34 PM  Result Value Ref Range   Glucose-Capillary 221 (H) 70 - 99 mg/dL    Comment: Glucose reference range applies only to samples taken after fasting for at least 8 hours.   Comment 1 Notify RN    Comment 2 Document in Chart   Glucose, capillary     Status: Abnormal   Collection Time: 05/05/21 10:01 PM  Result Value Ref Range   Glucose-Capillary 162 (H) 70 - 99 mg/dL    Comment: Glucose reference range applies only to samples taken after fasting for at least 8 hours.  CBC     Status: Abnormal   Collection Time: 05/06/21  3:49 AM  Result Value Ref Range   WBC 4.7 4.0 - 10.5 K/uL   RBC 3.42 (L) 4.22 - 5.81 MIL/uL   Hemoglobin 9.9 (L) 13.0 - 17.0 g/dL   HCT 32.3 (L) 39.0 - 52.0 %   MCV 94.4 80.0 - 100.0 fL   MCH 28.9 26.0 - 34.0 pg   MCHC 30.7 30.0 - 36.0 g/dL   RDW 15.8 (H) 11.5 - 15.5 %   Platelets 107 (L) 150 - 400 K/uL    Comment: Immature Platelet Fraction may be clinically indicated, consider ordering this additional test [id number] CONSISTENT WITH PREVIOUS RESULT    nRBC 0.0 0.0 - 0.2 %    Comment: Performed at [facility], [place] [address], [place], [place] [address]  Comprehensive metabolic panel     Status: Abnormal   Collection Time:  05/06/21  3:49 AM  Result Value Ref Range   Sodium 141 135 - 145 mmol/L   Potassium 4.1 3.5 - 5.1 mmol/L   Chloride 104 98 - 111 mmol/L   CO2 31 22 -  32 mmol/L   Glucose, Bld 167 (H) 70 - 99 mg/dL    Comment: Glucose reference range applies only to samples taken after fasting for at least 8 hours.   BUN 21 8 - 23 mg/dL   Creatinine, Ser 1.60 (H) 0.61 - 1.24 mg/dL   Calcium 8.9 8.9 - 10.3 mg/dL   Total Protein 5.4 (L) 6.5 - 8.1 g/dL   Albumin 3.1 (L) 3.5 - 5.0 g/dL   AST 15 15 - 41 U/L   ALT 10 0 - 44 U/L   Alkaline Phosphatase 69 38 - 126 U/L   Total Bilirubin 0.5 0.3 - 1.2 mg/dL   GFR, Estimated 43 (L) >60 mL/min    Comment: (NOTE) Calculated using the CKD-EPI Creatinine Equation (2021)    Anion gap 6 5 - 15    Comment: Performed at [facility], [place] [address], [place], [place] [address]  Glucose, capillary     Status: Abnormal   Collection Time: 05/06/21  5:56 AM  Result Value Ref Range   Glucose-Capillary 137 (H) 70 - 99 mg/dL    Comment: Glucose reference range applies only to samples taken after fasting for at least 8 hours.  Glucose, capillary     Status: Abnormal   Collection Time: 05/06/21 11:25 AM  Result Value Ref Range   Glucose-Capillary 106 (H) 70 - 99 mg/dL    Comment: Glucose reference range applies only to samples taken after fasting for at least 8 hours.   Comment 1 Notify RN    Comment 2 Document in Chart   Glucose, capillary     Status: Abnormal   Collection Time: 05/06/21  3:33 PM  Result Value Ref Range   Glucose-Capillary 119 (H) 70 - 99 mg/dL    Comment: Glucose reference range applies only to samples taken after fasting for at least 8 hours.  Glucose, capillary     Status: Abnormal   Collection Time: 05/06/21  9:12 PM  Result Value Ref Range   Glucose-Capillary 189 (H) 70 - 99 mg/dL    Comment: Glucose reference range applies only to samples taken after fasting for at least 8 hours.  Basic metabolic panel     Status: Abnormal   Collection  Time: 05/07/21  5:51 AM  Result Value Ref Range   Sodium 142 135 - 145 mmol/L   Potassium 4.3 3.5 - 5.1 mmol/L   Chloride 104 98 - 111 mmol/L   CO2 29 22 - 32 mmol/L   Glucose, Bld 154 (H) 70 - 99 mg/dL    Comment: Glucose reference range applies only to samples taken after fasting for at least 8 hours.   BUN 23 8 - 23 mg/dL   Creatinine, Ser 1.57 (H) 0.61 - 1.24 mg/dL   Calcium 9.2 8.9 - 10.3 mg/dL   GFR, Estimated 44 (L) >60 mL/min    Comment: (NOTE) Calculated using the CKD-EPI Creatinine Equation (2021)    Anion gap 9 5 - 15    Comment: Performed at [facility] [address] [place], [place] [address]  Glucose, capillary     Status: Abnormal   Collection Time: 05/07/21  6:04 AM  Result Value Ref Range   Glucose-Capillary 139 (H) 70 - 99 mg/dL    Comment: Glucose reference range applies only to samples taken after fasting for at least 8 hours.    ECG   N/A  Telemetry   Atrial fib with PVC's - Personally Reviewed  Radiology  ECHO TEE  Result Date: 05/06/2021    TRANSESOPHOGEAL ECHO REPORT   Patient Name:   Kristoffer S Wilkins Date of Exam: 05/06/2021 Medical Rec #:  [id number]      Height:       67.0 in Accession #:    [id number]     Weight:       218.9 lb Date of Birth:  1941/01/21      BSA:          2.101 m Patient Age:    11 years       BP:           131/43 mmHg Patient Gender: M              HR:           66 bpm. Exam Location:  Inpatient Procedure: Transesophageal Echo, 3D Echo, Cardiac Doppler and Color Doppler Indications:     Symptomatic Atrial Flutter  History:         Patient has prior history of Echocardiogram examinations, most                  recent 05/04/2021.  Sonographer:     Philipp Deputy Referring Phys:  [id number] [place] Diagnosing Phys: Eleonore Chiquito MD PROCEDURE: After discussion of the risks and benefits of a TEE, an informed consent was obtained from the patient. TEE procedure time was 30 minutes. The transesophogeal probe was passed  without difficulty through the esophogus of the patient. Imaged were obtained with the patient in a left lateral decubitus position. Local oropharyngeal anesthetic was provided with Cetacaine. Sedation performed by different physician. The patient was monitored while under deep sedation. Anesthestetic sedation was provided intravenously by Anesthesiology: 206mg  of Propofol. Image quality was excellent. The patient's vital signs; including heart rate, blood pressure, and oxygen saturation; remained stable throughout the procedure. The patient developed no complications during the procedure. IMPRESSIONS  1. No LAA thrombus. Successful DCCV with return to NSR.  2. The aortic valve is tricuspid. Aortic valve regurgitation is trivial. Moderate aortic valve stenosis. Aortic valve area, by VTI measures 1.11 cm. Aortic valve mean gradient measures 23.9 mmHg. Aortic valve Vmax measures 3.35 m/s.  3. Left ventricular ejection fraction, by estimation, is 55 to 60%. The left ventricle has normal function.  4. Right ventricular systolic function is normal. The right ventricular size is normal.  5. Left atrial size was mildly dilated. No left atrial/left atrial appendage thrombus was detected. The LAA emptying velocity was 66 cm/s.  6. The mitral valve is grossly normal. Trivial mitral valve regurgitation. No evidence of mitral stenosis.  7. There is mild (Grade II) layered plaque involving the descending aorta. FINDINGS  Left Ventricle: Left ventricular ejection fraction, by estimation, is 55 to 60%. The left ventricle has normal function. The left ventricular internal cavity size was normal in size. Right Ventricle: The right ventricular size is normal. No increase in right ventricular wall thickness. Right ventricular systolic function is normal. Left Atrium: Left atrial size was mildly dilated. No left atrial/left atrial appendage thrombus was detected. The LAA emptying velocity was 66 cm/s. Right Atrium: Right atrial size  was normal in size. Pericardium: There is no evidence of pericardial effusion. Mitral Valve: The mitral valve is grossly normal. Trivial mitral valve regurgitation. No evidence of mitral valve stenosis. Tricuspid Valve: The tricuspid valve is normal in structure. Tricuspid valve regurgitation is mild . No evidence of tricuspid stenosis. Aortic Valve: The aortic valve is tricuspid.  Aortic valve regurgitation is trivial. Moderate aortic stenosis is present. Aortic valve mean gradient measures 23.9 mmHg. Aortic valve peak gradient measures 44.9 mmHg. Aortic valve area, by VTI measures 1.11  cm. Pulmonic Valve: The pulmonic valve was grossly normal. Pulmonic valve regurgitation is not visualized. No evidence of pulmonic stenosis. Aorta: The aortic root and ascending aorta are structurally normal, with no evidence of dilitation. There is mild (Grade II) layered plaque involving the descending aorta. Venous: The left upper pulmonary vein and right upper pulmonary vein are normal. IAS/Shunts: No atrial level shunt detected by color flow Doppler. Additional Comments: A is visualized in the superior vena cava and right atrium.  LEFT VENTRICLE PLAX 2D LVOT diam:     2.20 cm LV SV:         79 LV SV Index:   38 LVOT Area:     3.80 cm  AORTIC VALVE AV Area (Vmax):    1.11 cm AV Area (Vmean):   1.18 cm AV Area (VTI):     1.11 cm AV Vmax:           335.08 cm/s AV Vmean:          228.598 cm/s AV VTI:            0.715 m AV Peak Grad:      44.9 mmHg AV Mean Grad:      23.9 mmHg LVOT Vmax:         98.13 cm/s LVOT Vmean:        71.090 cm/s LVOT VTI:          0.208 m LVOT/AV VTI ratio: 0.29  AORTA Ao Root diam: 3.20 cm Ao Asc diam:  3.30 cm  SHUNTS Systemic VTI:  0.21 m Systemic Diam: 2.20 cm Eleonore Chiquito MD Electronically signed by Eleonore Chiquito MD Signature Date/Time: 05/06/2021/4:14:06 PM    Final     Cardiac Studies   N/A  Assessment   Principal Problem:   CHF (congestive heart failure) (HCC) Active Problems:    Hyperlipidemia   CAD S/P percutaneous coronary angioplasty   Type 1 diabetes mellitus with nephropathy (HCC)   Thrombocytopenia (HCC)   Obstructive sleep apnea   Chronic diastolic congestive heart failure (HCC)   Chronic kidney disease (CKD), stage III (moderate) (HCC)   S/P CABG x 2   Essential hypertension   Adenocarcinoma of right lung, stage 4 (HCC)   CAP (community acquired pneumonia)   Acute on chronic combined systolic and diastolic CHF (congestive heart failure) (HCC)   Typical atrial flutter ([facility])   Plan   [name] was cardioverted out of atrial flutter, but I'm not convinced he is in sinus- no clear P waves - either way, afib would be more tolerable. He appears euvolemic on 40 mg po BID dosing, I would send him home on this. Creatinine has stabilized. He has follow-up scheduled with me on 6/16, would keep that appt.  Ok to d/c home today.   Time Spent Directly with Patient:  I have spent a total of 25 minutes with the patient reviewing hospital notes, telemetry, EKGs, labs and examining the patient as well as establishing an assessment and plan that was discussed personally with the patient.  > 50% of time was spent in direct patient care.  Length of Stay:  LOS: 5 days   Pixie Casino, MD, [facility], [place] Director of the [facility] Diplomate  of the [employer] of Clinical Lipidology Attending Cardiologist  Direct Dial: [phone]  Fax: [phone]  Website:  [email or web address]  Nadean Corwin Aloni Chuang 05/07/2021, 9:22 AM

## 2021-05-07 NOTE — Telephone Encounter (Signed)
Pt c/o medication issue: 1. Name of Medication:? Hydralazine 2. How are you currently taking this medication (dosage and times per day)? 1 time a day 3. Are you having a reaction (difficulty breathing--STAT)?  No  4. What is your medication issue? Patient need some assistant

## 2021-05-07 NOTE — Anesthesia Postprocedure Evaluation (Signed)
Anesthesia Post Note  Patient: Randall Wilkins  Procedure(s) Performed: TRANSESOPHAGEAL ECHOCARDIOGRAM (TEE) (N/A ) CARDIOVERSION (N/A )     Patient location during evaluation: Endoscopy Anesthesia Type: MAC Level of consciousness: awake and alert Pain management: pain level controlled Vital Signs Assessment: post-procedure vital signs reviewed and stable Respiratory status: spontaneous breathing, nonlabored ventilation, respiratory function stable and patient connected to nasal cannula oxygen Cardiovascular status: stable and blood pressure returned to baseline Postop Assessment: no apparent nausea or vomiting Anesthetic complications: no   No complications documented.  Last Vitals:  Vitals:   05/06/21 2200 05/07/21 0402  BP: (!) 115/58 133/62  Pulse:  65  Resp: 20 20  Temp: (!) 36.2 C (!) 36.3 C  SpO2:  93%    Last Pain:  Vitals:   05/07/21 0701  TempSrc:   PainSc: 0-No pain                 Janaria Mccammon S

## 2021-05-07 NOTE — Discharge Summary (Signed)
Physician Discharge Summary   Randall Wilkins  male DOB: October 02, 1941  CHY:[id number]  PCP: [name], MD  Admit date: 05/01/2021 Discharge date: 05/07/2021  Admitted From: home Disposition:  home CODE STATUS: Full code   Hospital Course:  For full details, please see H&P, progress notes, consult notes and ancillary notes.  Briefly,  Randall Wilkins is an 80 y.o. male withhistory of chronic systolic heart failure, persistent atrial fibrillation, CAD s/p CABG 2016, hypertension, diabetes mellitus and non-small cell lung cancer stage IV being followed by Dr. Julien Nordmann oncologist, chronic respiratory failure on home O2, OSAnot on CPAP therapy, who presented to the ER because of increasing shortness .  Here with CHF exacerbation and aflutter.  S/p cardioversion  acute on chronic combined diastolic and systolic heart failure Pt received diuresis initially with IV lasix, then transitioned to oral lasix.  Pt reported improvement in his respiratory symptoms.  Cardiology recommended discharging on oral lasix 40 mg BID.  Will follow up with cardiology as outpatient.  Atrial flutter -s/p TEE and cardioversion.  Continued Eliquis.    CKD stage IIIa -stable  pancytopenia  likely secondary to immunotherapy for malignancy.  history of type 2 diabetes Discharge on home regimen.  chronic hypoxic resp failure on 2L home O2  mild hypokalemia  repleted.  obesity Body mass index is 34.29 kg/m.   Discharge Diagnoses:  Principal Problem:   CHF (congestive heart failure) (HCC) Active Problems:   Hyperlipidemia   CAD S/P percutaneous coronary angioplasty   Type 1 diabetes mellitus with nephropathy (HCC)   Thrombocytopenia (HCC)   Obstructive sleep apnea   Chronic diastolic congestive heart failure (HCC)   Chronic kidney disease (CKD), stage III (moderate) (HCC)   S/P CABG x 2   Essential hypertension   Adenocarcinoma of right lung, stage 4 (HCC)   CAP (community acquired  pneumonia)   Acute on chronic combined systolic and diastolic CHF (congestive heart failure) (HCC)   Typical atrial flutter ([place])   30 Day Unplanned Readmission Risk Score   Flowsheet Row ED to Hosp-Admission (Current) from 05/01/2021 in [place] 2C CV PROGRESSIVE CARE  30 Day Unplanned Readmission Risk Score (%) 29.39 Filed at 05/07/2021 0801     This score is the patient's risk of an unplanned readmission within 30 days of being discharged (0 -100%). The score is based on dignosis, age, lab data, medications, orders, and past utilization.   Low:  0-14.9   Medium: 15-21.9   High: 22-29.9   Extreme: 30 and above        Discharge Instructions:  Allergies as of 05/07/2021      Reactions   Actos [pioglitazone] Swelling   Spironolactone Itching   Metformin And Related Nausea Only   3.16.2022 pt is currently taking this medication at home   Niaspan [niacin Er] Itching, Rash   Wound Dressing Adhesive Rash      Medication List    STOP taking these medications   lidocaine 2 % solution Commonly known as: XYLOCAINE   prednisoLONE acetate 1 % ophthalmic suspension Commonly known as: PRED FORTE     TAKE these medications   albuterol 108 (90 Base) MCG/ACT inhaler Commonly known as: VENTOLIN HFA Inhale 2 puffs into the lungs every 6 (six) hours as needed for wheezing or shortness of breath.   allopurinol 100 MG tablet Commonly known as: ZYLOPRIM Take 1 tablet (100 mg total) by mouth daily.   amLODipine 10 MG tablet Commonly known as: NORVASC TAKE ONE TABLET  BY MOUTH ONCE DAILY   apixaban 5 MG Tabs tablet Commonly known as: Eliquis Take 1 tablet (5 mg total) by mouth 2 (two) times daily.   Contour Next Monitor w/Device Kit 1 Device by Does not apply route 3 (three) times daily. Use to check blood sugars 3 times daily. Dx Code E13.9   Contour Next Test test strip Generic drug: glucose blood USE 1 STRIP TO CHECK GLUCOSE 4 TIMES DAILY   Easy Comfort Pen Needles 33G X 4 MM  Misc Generic drug: Insulin Pen Needle 1 each by Does not apply route See admin instructions. Use to inject insulin 5 times daily.   furosemide 20 MG tablet Commonly known as: LASIX Take 2 tablets (40 mg total) by mouth 2 (two) times daily. What changed:   how much to take  how to take this  when to take this  additional instructions   HumaLOG 100 UNIT/ML cartridge Generic drug: insulin lispro Inject 5-15 Units into the skin 3 (three) times daily with meals. Sliding scale.   isosorbide-hydrALAZINE 20-37.5 MG tablet Commonly known as: BIDIL Take 1 tablet by mouth 2 (two) times daily.   ketorolac 0.5 % ophthalmic solution Commonly known as: ACULAR Place 1 drop into the right eye 4 (four) times daily.   levothyroxine 75 MCG tablet Commonly known as: SYNTHROID Take 75 mcg by mouth daily before breakfast.   lidocaine-prilocaine cream Commonly known as: EMLA APPLY A TEASPOON OVER PORT SITE AT LEASTONE HOUR BEFORE LAB APPOINTMENT. DO NOT RUB IN AND COVER WITH PLASTIC WRAP What changed: See the new instructions.   lisinopril 10 MG tablet Commonly known as: ZESTRIL TAKE ONE TABLET DAILY   Lumakras 120 MG Tabs Generic drug: sotorasib TAKE 960 MG BY MOUTH EVERY MORNING. What changed:   how much to take  how to take this  when to take this   metFORMIN 750 MG 24 hr tablet Commonly known as: GLUCOPHAGE-XR TAKE 1 TABLET WITH DINNER What changed: See the new instructions.   ondansetron 4 MG tablet Commonly known as: ZOFRAN Take 1 tablet (4 mg total) by mouth every 6 (six) hours as needed for nausea.   OXYGEN Inhale 2 L into the lungs. At bedtime and during the day prn   potassium chloride SA 20 MEQ tablet Commonly known as: KLOR-CON Take 20 mEq by mouth 2 (two) times daily.   prochlorperazine 10 MG tablet Commonly known as: COMPAZINE Take 1 tablet (10 mg total) by mouth every 6 (six) hours as needed for nausea or vomiting.   rosuvastatin 20 MG tablet Commonly  known as: CRESTOR Take 20 mg by mouth daily.   Spiriva Respimat 2.5 MCG/ACT Aers Generic drug: Tiotropium Bromide Monohydrate Inhale 2 puffs into the lungs daily.   tamsulosin 0.4 MG Caps capsule Commonly known as: FLOMAX Take 0.4 mg by mouth at bedtime.   Tyler Aas FlexTouch 200 UNIT/ML FlexTouch Pen Generic drug: insulin degludec Inject 12 Units into the skin at bedtime.   vitamin B-12 1000 MCG tablet Commonly known as: CYANOCOBALAMIN Take 1,000 mcg by mouth at bedtime.        Follow-up Information    [name], MD. Schedule an appointment as soon as possible for a visit in 1 week(s).   Specialty: Family Medicine Contact information: [address] [place] [address] [phone]        Pixie Casino, MD Follow up on 05/29/2021.   Specialty: Cardiology Contact information: [address] [place] [address] [phone]  Allergies  Allergen Reactions  . Actos [Pioglitazone] Swelling  . Spironolactone Itching  . Metformin And Related Nausea Only    3.16.2022 pt is currently taking this medication at home  . Niaspan [Niacin Er] Itching and Rash  . Wound Dressing Adhesive Rash     The results of significant diagnostics from this hospitalization (including imaging, microbiology, ancillary and laboratory) are listed below for reference.   Consultations:   Procedures/Studies: DG Chest 1 View  Result Date: 05/01/2021 CLINICAL DATA:  Shortness of breath. EXAM: CHEST  1 VIEW COMPARISON:  March 14, 2021 FINDINGS: Stable cardiomediastinal silhouette. Sternotomy wires are noted. No pneumothorax is noted. Right internal jugular Port-A-Cath is unchanged in position. Mild bibasilar atelectasis or infiltrates are noted. Small pleural effusions may be present. Bony thorax is unremarkable. IMPRESSION: Mild bibasilar subsegmental atelectasis or infiltrates. Possible small pleural effusions. Electronically Signed   By:  Marijo Conception M.D.   On: 05/01/2021 17:06   ECHO TEE  Result Date: 05/06/2021    TRANSESOPHOGEAL ECHO REPORT   Patient Name:   Numan S Wilkins Date of Exam: 05/06/2021 Medical Rec #:  [id number]      Height:       67.0 in Accession #:    [id number]     Weight:       218.9 lb Date of Birth:  11/30/1941      BSA:          2.101 m Patient Age:    73 years       BP:           131/43 mmHg Patient Gender: M              HR:           66 bpm. Exam Location:  Inpatient Procedure: Transesophageal Echo, 3D Echo, Cardiac Doppler and Color Doppler Indications:     Symptomatic Atrial Flutter  History:         Patient has prior history of Echocardiogram examinations, most                  recent 05/04/2021.  Sonographer:     Philipp Deputy Referring Phys:  [id number] [place] Diagnosing Phys: Eleonore Chiquito MD PROCEDURE: After discussion of the risks and benefits of a TEE, an informed consent was obtained from the patient. TEE procedure time was 30 minutes. The transesophogeal probe was passed without difficulty through the esophogus of the patient. Imaged were obtained with the patient in a left lateral decubitus position. Local oropharyngeal anesthetic was provided with Cetacaine. Sedation performed by different physician. The patient was monitored while under deep sedation. Anesthestetic sedation was provided intravenously by Anesthesiology: 248m of Propofol. Image quality was excellent. The patient's vital signs; including heart rate, blood pressure, and oxygen saturation; remained stable throughout the procedure. The patient developed no complications during the procedure. IMPRESSIONS  1. No LAA thrombus. Successful DCCV with return to NSR.  2. The aortic valve is tricuspid. Aortic valve regurgitation is trivial. Moderate aortic valve stenosis. Aortic valve area, by VTI measures 1.11 cm. Aortic valve mean gradient measures 23.9 mmHg. Aortic valve Vmax measures 3.35 m/s.  3. Left ventricular ejection fraction,  by estimation, is 55 to 60%. The left ventricle has normal function.  4. Right ventricular systolic function is normal. The right ventricular size is normal.  5. Left atrial size was mildly dilated. No left atrial/left atrial appendage thrombus was detected. The LAA emptying velocity was 66 cm/s.  6.  The mitral valve is grossly normal. Trivial mitral valve regurgitation. No evidence of mitral stenosis.  7. There is mild (Grade II) layered plaque involving the descending aorta. FINDINGS  Left Ventricle: Left ventricular ejection fraction, by estimation, is 55 to 60%. The left ventricle has normal function. The left ventricular internal cavity size was normal in size. Right Ventricle: The right ventricular size is normal. No increase in right ventricular wall thickness. Right ventricular systolic function is normal. Left Atrium: Left atrial size was mildly dilated. No left atrial/left atrial appendage thrombus was detected. The LAA emptying velocity was 66 cm/s. Right Atrium: Right atrial size was normal in size. Pericardium: There is no evidence of pericardial effusion. Mitral Valve: The mitral valve is grossly normal. Trivial mitral valve regurgitation. No evidence of mitral valve stenosis. Tricuspid Valve: The tricuspid valve is normal in structure. Tricuspid valve regurgitation is mild . No evidence of tricuspid stenosis. Aortic Valve: The aortic valve is tricuspid. Aortic valve regurgitation is trivial. Moderate aortic stenosis is present. Aortic valve mean gradient measures 23.9 mmHg. Aortic valve peak gradient measures 44.9 mmHg. Aortic valve area, by VTI measures 1.11  cm. Pulmonic Valve: The pulmonic valve was grossly normal. Pulmonic valve regurgitation is not visualized. No evidence of pulmonic stenosis. Aorta: The aortic root and ascending aorta are structurally normal, with no evidence of dilitation. There is mild (Grade II) layered plaque involving the descending aorta. Venous: The left upper pulmonary  vein and right upper pulmonary vein are normal. IAS/Shunts: No atrial level shunt detected by color flow Doppler. Additional Comments: A is visualized in the superior vena cava and right atrium.  LEFT VENTRICLE PLAX 2D LVOT diam:     2.20 cm LV SV:         79 LV SV Index:   38 LVOT Area:     3.80 cm  AORTIC VALVE AV Area (Vmax):    1.11 cm AV Area (Vmean):   1.18 cm AV Area (VTI):     1.11 cm AV Vmax:           335.08 cm/s AV Vmean:          228.598 cm/s AV VTI:            0.715 m AV Peak Grad:      44.9 mmHg AV Mean Grad:      23.9 mmHg LVOT Vmax:         98.13 cm/s LVOT Vmean:        71.090 cm/s LVOT VTI:          0.208 m LVOT/AV VTI ratio: 0.29  AORTA Ao Root diam: 3.20 cm Ao Asc diam:  3.30 cm  SHUNTS Systemic VTI:  0.21 m Systemic Diam: 2.20 cm Eleonore Chiquito MD Electronically signed by Eleonore Chiquito MD Signature Date/Time: 05/06/2021/4:14:06 PM    Final    ECHOCARDIOGRAM LIMITED  Result Date: 05/04/2021    ECHOCARDIOGRAM LIMITED REPORT   Patient Name:   Tranquilino S Wilkins Date of Exam: 05/04/2021 Medical Rec #:  [id number]      Height:       67.0 in Accession #:    [id number]     Weight:       217.8 lb Date of Birth:  [birth year]      BSA:          2.097 m Patient Age:    89 years       BP:           160/65  mmHg Patient Gender: M              HR:           58 bpm. Exam Location:  Inpatient Procedure: Limited Echo, Limited Color Doppler and Cardiac Doppler Indications:    aortic stenosis. acute diastolic chf.  History:        Patient has prior history of Echocardiogram examinations, most                 recent 02/27/2021. CHF, chronic kidney disease, Aortic Valve                 Disease, Arrythmias:Atrial Flutter; Risk Factors:Sleep Apnea,                 Hypertension and Dyslipidemia.  Sonographer:    Johny Chess Referring Phys: [place]  1. Left ventricular ejection fraction, by estimation, is 45 to 50%. Left ventricular ejection fraction by PLAX is 45 %. The left ventricle has  mildly decreased function. There is mild left ventricular hypertrophy. Left ventricular diastolic function could not be evaluated.  2. The aortic valve is tricuspid. Moderate aortic valve stenosis. Aortic valve area, by VTI measures 1.18 cm. Aortic valve mean gradient measures 17.5 mmHg. Aortic valve Vmax measures 2.82 m/s. DI is 0.38.  3. There is severely elevated pulmonary artery systolic pressure.  4. Tricuspid valve regurgitation is mild to moderate.  5. The inferior vena cava is normal in size with <50% respiratory variability, suggesting right atrial pressure of 8 mmHg. Comparison(s): Changes from prior study are noted. 02/27/2021: LVEF 40-45%, mild AS - mean gradient 16 mmHg. FINDINGS  Left Ventricle: Left ventricular ejection fraction, by estimation, is 45 to 50%. Left ventricular ejection fraction by PLAX is 45 %. The left ventricle has mildly decreased function. The left ventricular internal cavity size was normal in size. There is  mild left ventricular hypertrophy. Left ventricular diastolic function could not be evaluated. Left ventricular diastolic function could not be evaluated due to atrial fibrillation. Right Ventricle: There is severely elevated pulmonary artery systolic pressure. The tricuspid regurgitant velocity is 4.21 m/s, and with an assumed right atrial pressure of 8 mmHg, the estimated right ventricular systolic pressure is 25.9 mmHg. Tricuspid Valve: The tricuspid valve is grossly normal. Tricuspid valve regurgitation is mild to moderate. Aortic Valve: The aortic valve is tricuspid. Moderate aortic stenosis is present. Aortic valve mean gradient measures 17.5 mmHg. Aortic valve peak gradient measures 31.8 mmHg. Aortic valve area, by VTI measures 1.18 cm. Venous: The inferior vena cava is normal in size with less than 50% respiratory variability, suggesting right atrial pressure of 8 mmHg. EKG: Rhythm strip during this exam demostrated atrial flutter. LEFT VENTRICLE PLAX 2D LV EF:          Left ventricular ejection fraction by PLAX is 45 %. LVIDd:         4.90 cm LVIDs:         3.80 cm LV PW:         1.10 cm LV IVS:        1.00 cm LVOT diam:     2.00 cm LV SV:         72 LV SV Index:   34 LVOT Area:     3.14 cm  IVC IVC diam: 1.90 cm LEFT ATRIUM         Index LA diam:    5.00 cm 2.38 cm/m  AORTIC VALVE AV Area (Vmax):  1.10 cm AV Area (Vmean):   1.09 cm AV Area (VTI):     1.18 cm AV Vmax:           282.00 cm/s AV Vmean:          191.500 cm/s AV VTI:            0.608 m AV Peak Grad:      31.8 mmHg AV Mean Grad:      17.5 mmHg LVOT Vmax:         98.95 cm/s LVOT Vmean:        66.450 cm/s LVOT VTI:          0.229 m LVOT/AV VTI ratio: 0.38 TRICUSPID VALVE TR Peak grad:   70.9 mmHg TR Vmax:        421.00 cm/s  SHUNTS Systemic VTI:  0.23 m Systemic Diam: 2.00 cm Lyman Bishop MD Electronically signed by Lyman Bishop MD Signature Date/Time: 05/04/2021/12:31:32 PM    Final       Labs: BNP (last 3 results) Recent Labs    03/01/21 0302 03/14/21 1144 05/02/21 0152  BNP 1,117.2* 356.0* 878.6*   Basic Metabolic Panel: Recent Labs  Lab 05/03/21 0700 05/04/21 0237 05/05/21 0343 05/05/21 1214 05/06/21 0349 05/07/21 0551  NA 142 141 139  --  141 142  K 3.5 3.5 3.4*  --  4.1 4.3  CL 106 102 104  --  104 104  CO2 _0 --  31 29  GLUCOSE 119* 122* 142*  --  167* 154*  BUN _1 --  21 23  CREATININE 1.48* 1.51* 1.58*  --  1.60* 1.57*  CALCIUM 8.8* 8.8* 8.7*  --  8.9 9.2  MG  --   --   --  2.0  --   --    Liver Function Tests: Recent Labs  Lab 05/01/21 1618 05/06/21 0349  AST 20 15  ALT 11 10  ALKPHOS 72 69  BILITOT 1.0 0.5  PROT 6.3* 5.4*  ALBUMIN 3.6 3.1*   No results for input(s): LIPASE, AMYLASE in the last 168 hours. No results for input(s): AMMONIA in the last 168 hours. CBC: Recent Labs  Lab 05/01/21 1618 05/05/21 1214 05/06/21 0349  WBC 3.5* 5.5 4.7  NEUTROABS 2.2  --   --   HGB 11.0* 11.3* 9.9*  HCT 35.9* 37.1* 32.3*  MCV 95.2 93.5 94.4   PLT 126* 107* 107*   Cardiac Enzymes: No results for input(s): CKTOTAL, CKMB, CKMBINDEX, TROPONINI in the last 168 hours. BNP: Invalid input(s): POCBNP CBG: Recent Labs  Lab 05/06/21 0556 05/06/21 1125 05/06/21 1533 05/06/21 2112 05/07/21 0604  GLUCAP 137* 106* 119* 189* 139*   D-Dimer No results for input(s): DDIMER in the last 72 hours. Hgb A1c No results for input(s): HGBA1C in the last 72 hours. Lipid Profile No results for input(s): CHOL, HDL, LDLCALC, TRIG, CHOLHDL, LDLDIRECT in the last 72 hours. Thyroid function studies No results for input(s): TSH, T4TOTAL, T3FREE, THYROIDAB in the last 72 hours.  Invalid input(s): FREET3 Anemia work up No results for input(s): VITAMINB12, FOLATE, FERRITIN, TIBC, IRON, RETICCTPCT in the last 72 hours. Urinalysis    Component Value Date/Time   COLORURINE YELLOW 02/26/2021 1221   APPEARANCEUR CLEAR 02/26/2021 1221   LABSPEC 1.009 02/26/2021 1221   PHURINE 6.0 02/26/2021 [place] 02/26/2021 [place] 08/29/2019 0959   HGBUR SMALL (A) 02/26/2021 1221   [place] 02/26/2021 1221  KETONESUR NEGATIVE 02/26/2021 1221   PROTEINUR 100 (A) 02/26/2021 1221   UROBILINOGEN 0.2 08/29/2019 0959   NITRITE NEGATIVE 02/26/2021 1221   LEUKOCYTESUR NEGATIVE 02/26/2021 1221   Sepsis Labs Invalid input(s): PROCALCITONIN,  WBC,  LACTICIDVEN Microbiology Recent Results (from the past 240 hour(s))  Resp Panel by RT-PCR (Flu A&B, Covid) Nasopharyngeal Swab     Status: None   Collection Time: 05/01/21  4:21 PM   Specimen: Nasopharyngeal Swab; Nasopharyngeal(NP) swabs in vial transport medium  Result Value Ref Range Status   SARS Coronavirus 2 by RT PCR NEGATIVE NEGATIVE Final    Comment: (NOTE) SARS-CoV-2 target nucleic acids are NOT DETECTED.  The SARS-CoV-2 RNA is generally detectable in upper respiratory specimens during the acute phase of infection. The lowest concentration of SARS-CoV-2 viral  copies this assay can detect is 138 copies/mL. A negative result does not preclude SARS-Cov-2 infection and should not be used as the sole basis for treatment or other patient management decisions. A negative result may occur with  improper specimen collection/handling, submission of specimen other than nasopharyngeal swab, presence of viral mutation(s) within the areas targeted by this assay, and inadequate number of viral copies(<138 copies/mL). A negative result must be combined with clinical observations, patient history, and epidemiological information. The expected result is Negative.  Fact Sheet for Patients:  EntrepreneurPulse.com.au  Fact Sheet for Healthcare Providers:  IncredibleEmployment.be  This test is no t yet approved or cleared by the [place] FDA and  has been authorized for detection and/or diagnosis of SARS-CoV-2 by FDA under an Emergency Use Authorization (EUA). This EUA will remain  in effect (meaning this test can be used) for the duration of the COVID-19 declaration under Section 564(b)(1) of the Act, 21 U.S.C.section 360bbb-3(b)(1), unless the authorization is terminated  or revoked sooner.       Influenza A by PCR NEGATIVE NEGATIVE Final   Influenza B by PCR NEGATIVE NEGATIVE Final    Comment: (NOTE) The Xpert Xpress SARS-CoV-2/FLU/RSV plus assay is intended as an aid in the diagnosis of influenza from Nasopharyngeal swab specimens and should not be used as a sole basis for treatment. Nasal washings and aspirates are unacceptable for Xpert Xpress SARS-CoV-2/FLU/RSV testing.  Fact Sheet for Patients: EntrepreneurPulse.com.au  Fact Sheet for Healthcare Providers: IncredibleEmployment.be  This test is not yet approved or cleared by the [place] FDA and has been authorized for detection and/or diagnosis of SARS-CoV-2 by FDA under an Emergency Use Authorization (EUA). This  EUA will remain in effect (meaning this test can be used) for the duration of the COVID-19 declaration under Section 564(b)(1) of the Act, 21 U.S.C. section 360bbb-3(b)(1), unless the authorization is terminated or revoked.  Performed at [facility], [place] [address], [place], [place] [address]   Blood culture (routine x 2)     Status: None   Collection Time: 05/01/21 10:50 PM   Specimen: BLOOD  Result Value Ref Range Status   Specimen Description BLOOD LEFT ANTECUBITAL  Final   Special Requests   Final    BOTTLES DRAWN AEROBIC AND ANAEROBIC Blood Culture adequate volume   Culture   Final    NO GROWTH 5 DAYS Performed at [facility], [place] [address], [place], [place] [address]    Report Status 05/07/2021 FINAL  Final  MRSA PCR Screening     Status: None   Collection Time: 05/02/21  2:56 PM   Specimen: Nasopharyngeal  Result Value Ref Range Status   MRSA by PCR  NEGATIVE NEGATIVE Final    Comment:        The GeneXpert MRSA Assay (FDA approved for NASAL specimens only), is one component of a comprehensive MRSA colonization surveillance program. It is not intended to diagnose MRSA infection nor to guide or monitor treatment for MRSA infections. Performed at [facility], [place] [address] [place], [place] [address]      Total time spend on discharging this patient, including the last patient exam, discussing the hospital stay, instructions for ongoing care as it relates to all pertinent caregivers, as well as preparing the medical discharge records, prescriptions, and/or referrals as applicable, is 35 minutes.    Enzo Bi, MD  [facility] 05/07/2021, 9:54 AM

## 2021-05-07 NOTE — Telephone Encounter (Signed)
Dr. Oval Linsey started him on Bidil - since cost is an issue, fine to give the separate medications.  Dr. Debara Pickett

## 2021-05-07 NOTE — Telephone Encounter (Signed)
I would recommend that you order the two medicines separate-  hydralazine 25mg  tablets take 1.5 tablets twice a day  and  isosorbide dinitrate 20mg  twice a day

## 2021-05-07 NOTE — Telephone Encounter (Signed)
Spoke with patient's wife - patient discharged today  Bidil will cost $185 for 1 month supply  They cannot afford this   I am unaware of patient assistance for Bidil -- will route to pharmacy team and MD to review

## 2021-05-08 DIAGNOSIS — I130 Hypertensive heart and chronic kidney disease with heart failure and stage 1 through stage 4 chronic kidney disease, or unspecified chronic kidney disease: Secondary | ICD-10-CM | POA: Diagnosis not present

## 2021-05-08 DIAGNOSIS — N1831 Chronic kidney disease, stage 3a: Secondary | ICD-10-CM | POA: Diagnosis not present

## 2021-05-08 DIAGNOSIS — C3491 Malignant neoplasm of unspecified part of right bronchus or lung: Secondary | ICD-10-CM | POA: Diagnosis not present

## 2021-05-08 DIAGNOSIS — E1322 Other specified diabetes mellitus with diabetic chronic kidney disease: Secondary | ICD-10-CM | POA: Diagnosis not present

## 2021-05-08 DIAGNOSIS — I5043 Acute on chronic combined systolic (congestive) and diastolic (congestive) heart failure: Secondary | ICD-10-CM | POA: Diagnosis not present

## 2021-05-08 MED ORDER — ISOSORBIDE DINITRATE 20 MG PO TABS: 20.0000 mg | ORAL_TABLET | Freq: Two times a day (BID) | ORAL | 3 refills | Status: DC

## 2021-05-08 MED ORDER — HYDRALAZINE HCL 25 MG PO TABS: 37.5000 mg | ORAL_TABLET | Freq: Two times a day (BID) | ORAL | 3 refills | Status: DC

## 2021-05-08 NOTE — Telephone Encounter (Signed)
Spoke with wife and relayed advice from MD and pharmacy team. She agreed w/plan for med change. Rx(s) sent to pharmacy electronically.

## 2021-05-09 ENCOUNTER — Other Ambulatory Visit: Payer: Self-pay

## 2021-05-12 ENCOUNTER — Other Ambulatory Visit: Payer: Self-pay | Admitting: Internal Medicine

## 2021-05-13 DIAGNOSIS — N183 Chronic kidney disease, stage 3 unspecified: Secondary | ICD-10-CM | POA: Diagnosis not present

## 2021-05-13 DIAGNOSIS — I10 Essential (primary) hypertension: Secondary | ICD-10-CM | POA: Diagnosis not present

## 2021-05-13 DIAGNOSIS — E1149 Type 2 diabetes mellitus with other diabetic neurological complication: Secondary | ICD-10-CM | POA: Diagnosis not present

## 2021-05-13 DIAGNOSIS — E039 Hypothyroidism, unspecified: Secondary | ICD-10-CM | POA: Diagnosis not present

## 2021-05-13 NOTE — Progress Notes (Signed)
Office Visit Note  Patient: Randall Wilkins             Date of Birth: [birth year]           MRN: [id number]             PCP: [name], MD Referring: [name], MD Visit Date: 05/27/2021 Occupation: @GUAROCC @  Subjective:  Medication management.   History of Present Illness: Randall Wilkins is a 80 y.o. male with history of gout and osteoarthritis.  He was accompanied by his wife today.  According to patient's wife Dawan has been taking allopurinol 100 mg daily.  He has not had any gout flare.  He has been watching his diet closely.  He eats shrimp occasionally.  He does not drink any alcohol.  He denies discomfort in any joints currently.  His  right knee joint is doing better.  Activities of Daily Living:  Patient reports morning stiffness for 1 hour.   Patient Denies nocturnal pain.  Difficulty dressing/grooming: Denies Difficulty climbing stairs: Reports Difficulty getting out of chair: Denies Difficulty using hands for taps, buttons, cutlery, and/or writing: Reports  Review of Systems  Constitutional:  Positive for fatigue.  HENT:  Positive for mouth dryness. Negative for mouth sores and nose dryness.   Eyes:  Negative for pain, itching and dryness.  Respiratory:  Positive for shortness of breath and difficulty breathing. Negative for wheezing.   Cardiovascular:  Negative for chest pain and palpitations.  Gastrointestinal:  Negative for blood in stool, constipation and diarrhea.  Endocrine: Negative for increased urination.  Genitourinary:  Negative for difficulty urinating.  Musculoskeletal:  Positive for myalgias, morning stiffness, muscle tenderness and myalgias. Negative for joint pain, joint pain and joint swelling.  Skin:  Negative for color change, rash and redness.  Allergic/Immunologic: Negative for susceptible to infections.  Neurological:  Positive for numbness, memory loss and weakness. Negative for dizziness and headaches.  Hematological:  Positive  for bruising/bleeding tendency.  Psychiatric/Behavioral:  Positive for confusion.    PMFS History:  Patient Active Problem List   Diagnosis Date Noted   Typical atrial flutter ([place]) 05/04/2021   CHF (congestive heart failure) ([place]) 05/02/2021   Acute on chronic combined systolic and diastolic CHF (congestive heart failure) ([place])    CAP (community acquired pneumonia) 05/01/2021   Cystoid macular degeneration of right eye 04/02/2021   Symptomatic bradycardia 02/26/2021   Port-A-Cath in place 02/03/2021   Macular pucker, right eye 01/29/2021   Macular pucker, left eye 01/29/2021   Memory loss 12/23/2020   Cerebrovascular accident (CVA) ([place]) 12/23/2020   ARF (acute renal failure) ([place]) 10/02/2020   Adenocarcinoma of right lung, stage 4 ([place]) 09/02/2020   Encounter for antineoplastic immunotherapy 09/02/2020   Goals of care, counseling/discussion 08/20/2020   Encounter for antineoplastic chemotherapy 08/20/2020   Mediastinal lymphadenopathy 08/06/2020   Lung mass 06/05/2020   Essential hypertension 03/23/2018   Mild cognitive impairment 12/24/2017   Gait abnormality 12/24/2017   Murmur, cardiac 03/04/2017   History of stroke 12/12/2015   Gait disorder 12/12/2015   Coronary artery disease involving native coronary artery of native heart without angina pectoris    S/P CABG x 2 03/29/2015   Aortic valve sclerosis 03/29/2015   Peptic ulcer with hemorrhage 03/28/2015   Left main coronary artery disease 03/28/2015   Chronic diastolic congestive heart failure ([place])    Chronic kidney disease (CKD), stage III (moderate) ([place])    Dysphagia    AKI (acute kidney injury) ([place])  Acute respiratory failure with hypoxia ([place]) 03/25/2015   NSTEMI (non-ST elevated myocardial infarction) ([place]) 03/25/2015   Shortness of breath    UTI (lower urinary tract infection)    Subendocardial MI subsequent episode care ([place]) 03/24/2015   SOB (shortness of breath)    Confusion 03/21/2015   Weakness  generalized 03/21/2015   Increased urinary frequency 03/21/2015   Hyperglycemia 03/21/2015   Thrombocytopenia ([place]) 03/21/2015   UTI (urinary tract infection) 03/21/2015   Fall 03/21/2015   Hematoma of abdominal wall 03/21/2015   Diarrhea 03/21/2015   Obstructive sleep apnea 03/21/2015   Acute renal failure ([place]) 02/40/9735   Mild diastolic dysfunction 32/99/2426   Dyspnea on exertion 03/21/2015   Neuropathy 09/11/2014   Bilateral leg edema 05/21/2014   Diabetic peripheral neuropathy associated with type 1 diabetes mellitus ([place]) 11/14/2013   Type 1 diabetes mellitus with nephropathy ([place]) 11/14/2013   Aortic valve stenosis 11/14/2013   Obesity (BMI 30-39.9) 11/14/2013   Asymptomatic PVCs 11/14/2013   Memory impairment 02/04/2013   Hyperlipidemia 02/04/2013   Diabetes 1.5, managed as type 1 ([place]) 02/04/2013   Cerebral infarction ([place]) 02/04/2013   CAD S/P percutaneous coronary angioplasty 11/14/2004    Past Medical History:  Diagnosis Date   Aortic stenosis, mild 11/14/2013   Aortic valve sclerosis 03/29/2015   Bilateral leg edema 05/21/2014   CAD (coronary artery disease)    Chronic diastolic congestive heart failure (HCC)    Chronic kidney disease (CKD), stage III (moderate) (HCC)    Diabetes 1.5, managed as type 1 ([place]) 02/04/2013   Diabetic peripheral neuropathy associated with type 1 diabetes mellitus ([place]) 11/14/2013   Dysphagia    Dyspnea on exertion 03/21/2015   Exogenous obesity    Gout    Heart attack ([place])    History of nuclear stress test 07/2011   dipyridamole; fixed inferolateral defect, worse at stress than rest; no reversible ischemia; low risk scan    Hyperlipidemia    Hypertension    Hypothyroidism    Insulin dependent diabetes mellitus    Left foot drop    Left main coronary artery disease 03/28/2015   lung ca dx'd 08/2020   Memory loss    Obesity (BMI 30-39.9) 11/14/2013   Obstructive sleep apnea 03/21/2015   OSA on CPAP    uses a cpap   Peptic ulcer with  hemorrhage 03/28/2015   Peripheral neuropathy    Pneumonia    Rhabdomyolysis    S/P CABG x 2 03/29/2015   LIMA to Diagonal, SVG to OM, EVH via right thigh   Stroke ([place])    L patietal with small scattered lacunar infarcts   Thrombocytopenia ([place]) 03/21/2015   Venous insufficiency    Weakness generalized 03/21/2015    Family History  Problem Relation Age of Onset   Heart disease Mother    Coronary artery disease Father    Cancer Maternal Grandmother    Heart Problems Maternal Grandfather    Diabetes Son        borderline    Past Surgical History:  Procedure Laterality Date   BACK SURGERY  2002   lumbosacral. 11 back surgeries total   BRONCHIAL BIOPSY  08/06/2020   Procedure: BRONCHIAL BIOPSIES;  Surgeon: Collene Gobble, MD;  Location: [facility] ENDOSCOPY;  Service: Pulmonary;;   BRONCHIAL BRUSHINGS  08/06/2020   Procedure: BRONCHIAL BRUSHINGS;  Surgeon: Collene Gobble, MD;  Location: [place];  Service: Pulmonary;;   BRONCHIAL NEEDLE ASPIRATION BIOPSY  08/06/2020   Procedure: BRONCHIAL NEEDLE ASPIRATION BIOPSIES;  Surgeon: Collene Gobble, MD;  Location: [facility] ENDOSCOPY;  Service: Pulmonary;;   CARDIOVERSION N/A 05/06/2021   Procedure: CARDIOVERSION;  Surgeon: Geralynn Rile, MD;  Location: [place];  Service: Cardiovascular;  Laterality: N/A;   Carotid Doppler  03/2013   bilat bulb/prox ICAs - mild amount of fibrous plaque with no evidence of diameter reduction   CARPAL TUNNEL RELEASE Bilateral 08/09/2014   Procedure: BILATERAL CARPAL TUNNEL RELEASE;  Surgeon: Daryll Brod, MD;  Location: [place];  Service: Orthopedics;  Laterality: Bilateral;  ANESTHESIA:  IV REGIONAL BIL FAB   CHOLECYSTECTOMY     COLONOSCOPY     CORONARY ANGIOPLASTY  10/13/1996   CORONARY ANGIOPLASTY  09/21/1989   emergency PTCA   CORONARY ANGIOPLASTY  10/13/1996   Multi-Link diagonal & OD stenting (Dr. Marella Chimes)   [place]  12/03/1997   disease of mid DX-1 ~50% & in mid  PLA & PDA (distal lesions) (Dr. Marella Chimes)    CORONARY ANGIOPLASTY  10/14/1999   progression of disease distal PLA & PDA; progression of disease prox RCA - moderate (Dr. Marella Chimes)    CORONARY ANGIOPLASTY WITH STENT PLACEMENT  04/04/2004   4.0x68mm non-DES (thrombectomy via AngioJet) to RCA for high grade stenosis (Dr. Marella Chimes)   CORONARY ARTERY BYPASS GRAFT N/A 03/29/2015   Procedure: CORONARY ARTERY BYPASS GRAFTING TIMES TWO USING LEFT INTERNAL MAMMARY ARTERY AND RIGHT LEG GREATER SAPHENOUS VEIN HARVESTED ENDOSCOPICALLY.;  Surgeon: Rexene Alberts, MD;  Location: [place];  Service: Open Heart Surgery;  Laterality: N/A;   ESOPHAGOGASTRODUODENOSCOPY N/A 03/27/2015   Procedure: ESOPHAGOGASTRODUODENOSCOPY (EGD);  Surgeon: Clarene Essex, MD;  Location: [facility] ENDOSCOPY;  Service: Endoscopy;  Laterality: N/A;  possible dilation   ESOPHAGOGASTRODUODENOSCOPY N/A 10/05/2020   Procedure: ESOPHAGOGASTRODUODENOSCOPY (EGD);  Surgeon: Arta Silence, MD;  Location: [facility] END[facility];  Service: Endoscopy;  Laterality: N/A;   EYE SURGERY Right    macular fold-02/2021   FINE NEEDLE ASPIRATION  08/06/2020   Procedure: FINE NEEDLE ASPIRATION (FNA) LINEAR;  Surgeon: Collene Gobble, MD;  Location: [place] ENDOSCOPY;  Service: Pulmonary;;   IR IMAGING GUIDED PORT INSERTION  10/07/2020   LEFT HEART CATHETERIZATION WITH CORONARY ANGIOGRAM N/A 03/28/2015   Procedure: LEFT HEART CATHETERIZATION WITH CORONARY ANGIOGRAM;  Surgeon: Peter M Martinique, MD;  Location: [facility] CATH LAB;  Service: Cardiovascular;  Laterality: N/A;   SINUS ENDO W/FUSION     TEE WITHOUT CARDIOVERSION N/A 05/06/2021   Procedure: TRANSESOPHAGEAL ECHOCARDIOGRAM (TEE);  Surgeon: Geralynn Rile, MD;  Location: [place];  Service: Cardiovascular;  Laterality: N/A;   TONSILLECTOMY     TRANSTHORACIC ECHOCARDIOGRAM  08/08/2013   EF 55-60%, mild conc hypertrophy, grade 1 diastolic dysfunction; AV with mild stenosis; LA & RA mildly dilated   VIDEO  BRONCHOSCOPY WITH ENDOBRONCHIAL NAVIGATION N/A 08/06/2020   Procedure: VIDEO BRONCHOSCOPY WITH ENDOBRONCHIAL NAVIGATION;  Surgeon: Collene Gobble, MD;  Location: [place] ENDOSCOPY;  Service: Pulmonary;  Laterality: N/A;   VIDEO BRONCHOSCOPY WITH ENDOBRONCHIAL ULTRASOUND N/A 08/06/2020   Procedure: VIDEO BRONCHOSCOPY WITH ENDOBRONCHIAL ULTRASOUND;  Surgeon: Collene Gobble, MD;  Location: [place] ENDOSCOPY;  Service: Pulmonary;  Laterality: N/A;   Social History   Social History Narrative   Lives with wife and son   Right handed    Caffeine: maybe 1 cup/day   Immunization History  Administered Date(s) Administered   Influenza, High Dose Seasonal PF 09/06/2014, 09/06/2015, 09/18/2016, 09/06/2017, 08/31/2018, 08/28/2019, 09/07/2020   Influenza,inj,Quad PF,6+ Mos 11/14/2013   Influenza-Unspecified 08/29/2018   PFIZER(Purple Top)SARS-COV-2 Vaccination 01/26/2020,  02/26/2020, 08/27/2020     Objective: Vital Signs: BP (!) 110/53 (BP Location: Left Arm, Patient Position: Sitting, Cuff Size: Normal)   Pulse (!) 50   Resp 18   Ht 5\' 7"  (1.702 m)   Wt 220 lb (99.8 kg) Comment: per patient  BMI 34.46 kg/m    Physical Exam Vitals and nursing note reviewed. Chaperone present: Patient was with his wife in a wheelchair..  Constitutional:      Appearance: He is well-developed.  HENT:     Head: Normocephalic and atraumatic.  Eyes:     Conjunctiva/sclera: Conjunctivae normal.     Pupils: Pupils are equal, round, and reactive to light.  Cardiovascular:     Rate and Rhythm: Normal rate and regular rhythm.     Heart sounds: Normal heart sounds.  Pulmonary:     Effort: Pulmonary effort is normal.     Breath sounds: Normal breath sounds.  Abdominal:     General: Bowel sounds are normal.     Palpations: Abdomen is soft.  Musculoskeletal:     Cervical back: Normal range of motion and neck supple.  Skin:    General: Skin is warm and dry.     Capillary Refill: Capillary refill takes less than 2  seconds.  Neurological:     Mental Status: He is alert and oriented to person, place, and time.  Psychiatric:        Behavior: Behavior normal.     Musculoskeletal Exam: C-spine was in good range of motion.  Shoulder joints, elbow joints, wrist joints, MCPs PIPs and DIPs with good range of motion with no synovitis.  Hip joints were difficult to assess in the sitting position.  Knee joints with good range of motion.  No warmth swelling or effusion was noted.  CDAI Exam: CDAI Score: -- Patient Global: --; Provider Global: -- Swollen: --; Tender: -- Joint Exam 05/27/2021   No joint exam has been documented for this visit   There is currently no information documented on the homunculus. Go to the Rheumatology activity and complete the homunculus joint exam.  Investigation: No additional findings.  Imaging: DG Chest 1 View  Result Date: 05/01/2021 CLINICAL DATA:  Shortness of breath. EXAM: CHEST  1 VIEW COMPARISON:  March 14, 2021 FINDINGS: Stable cardiomediastinal silhouette. Sternotomy wires are noted. No pneumothorax is noted. Right internal jugular Port-A-Cath is unchanged in position. Mild bibasilar atelectasis or infiltrates are noted. Small pleural effusions may be present. Bony thorax is unremarkable. IMPRESSION: Mild bibasilar subsegmental atelectasis or infiltrates. Possible small pleural effusions. Electronically Signed   By: Marijo Conception M.D.   On: 05/01/2021 17:06   ECHO TEE  Result Date: 05/06/2021    TRANSESOPHOGEAL ECHO REPORT   Patient Name:   Baylin S Wilkins Date of Exam: 05/06/2021 Medical Rec #:  [id number]      Height:       67.0 in Accession #:    [id number]     Weight:       218.9 lb Date of Birth:  12/09/1941      BSA:          2.101 m Patient Age:    80 years       BP:           131/43 mmHg Patient Gender: M              HR:           66 bpm. Exam Location:  Inpatient Procedure: Transesophageal  Echo, 3D Echo, Cardiac Doppler and Color Doppler Indications:      Symptomatic Atrial Flutter  History:         Patient has prior history of Echocardiogram examinations, most                  recent 05/04/2021.  Sonographer:     Philipp Deputy Referring Phys:  [id number] [place] Diagnosing Phys: Eleonore Chiquito MD PROCEDURE: After discussion of the risks and benefits of a TEE, an informed consent was obtained from the patient. TEE procedure time was 30 minutes. The transesophogeal probe was passed without difficulty through the esophogus of the patient. Imaged were obtained with the patient in a left lateral decubitus position. Local oropharyngeal anesthetic was provided with Cetacaine. Sedation performed by different physician. The patient was monitored while under deep sedation. Anesthestetic sedation was provided intravenously by Anesthesiology: 206mg  of Propofol. Image quality was excellent. The patient's vital signs; including heart rate, blood pressure, and oxygen saturation; remained stable throughout the procedure. The patient developed no complications during the procedure. IMPRESSIONS  1. No LAA thrombus. Successful DCCV with return to NSR.  2. The aortic valve is tricuspid. Aortic valve regurgitation is trivial. Moderate aortic valve stenosis. Aortic valve area, by VTI measures 1.11 cm. Aortic valve mean gradient measures 23.9 mmHg. Aortic valve Vmax measures 3.35 m/s.  3. Left ventricular ejection fraction, by estimation, is 55 to 60%. The left ventricle has normal function.  4. Right ventricular systolic function is normal. The right ventricular size is normal.  5. Left atrial size was mildly dilated. No left atrial/left atrial appendage thrombus was detected. The LAA emptying velocity was 66 cm/s.  6. The mitral valve is grossly normal. Trivial mitral valve regurgitation. No evidence of mitral stenosis.  7. There is mild (Grade II) layered plaque involving the descending aorta. FINDINGS  Left Ventricle: Left ventricular ejection fraction, by estimation, is  55 to 60%. The left ventricle has normal function. The left ventricular internal cavity size was normal in size. Right Ventricle: The right ventricular size is normal. No increase in right ventricular wall thickness. Right ventricular systolic function is normal. Left Atrium: Left atrial size was mildly dilated. No left atrial/left atrial appendage thrombus was detected. The LAA emptying velocity was 66 cm/s. Right Atrium: Right atrial size was normal in size. Pericardium: There is no evidence of pericardial effusion. Mitral Valve: The mitral valve is grossly normal. Trivial mitral valve regurgitation. No evidence of mitral valve stenosis. Tricuspid Valve: The tricuspid valve is normal in structure. Tricuspid valve regurgitation is mild . No evidence of tricuspid stenosis. Aortic Valve: The aortic valve is tricuspid. Aortic valve regurgitation is trivial. Moderate aortic stenosis is present. Aortic valve mean gradient measures 23.9 mmHg. Aortic valve peak gradient measures 44.9 mmHg. Aortic valve area, by VTI measures 1.11  cm. Pulmonic Valve: The pulmonic valve was grossly normal. Pulmonic valve regurgitation is not visualized. No evidence of pulmonic stenosis. Aorta: The aortic root and ascending aorta are structurally normal, with no evidence of dilitation. There is mild (Grade II) layered plaque involving the descending aorta. Venous: The left upper pulmonary vein and right upper pulmonary vein are normal. IAS/Shunts: No atrial level shunt detected by color flow Doppler. Additional Comments: A is visualized in the superior vena cava and right atrium.  LEFT VENTRICLE PLAX 2D LVOT diam:     2.20 cm LV SV:         79 LV SV Index:   38 LVOT Area:  3.80 cm  AORTIC VALVE AV Area (Vmax):    1.11 cm AV Area (Vmean):   1.18 cm AV Area (VTI):     1.11 cm AV Vmax:           335.08 cm/s AV Vmean:          228.598 cm/s AV VTI:            0.715 m AV Peak Grad:      44.9 mmHg AV Mean Grad:      23.9 mmHg LVOT Vmax:          98.13 cm/s LVOT Vmean:        71.090 cm/s LVOT VTI:          0.208 m LVOT/AV VTI ratio: 0.29  AORTA Ao Root diam: 3.20 cm Ao Asc diam:  3.30 cm  SHUNTS Systemic VTI:  0.21 m Systemic Diam: 2.20 cm Eleonore Chiquito MD Electronically signed by Eleonore Chiquito MD Signature Date/Time: 05/06/2021/4:14:06 PM    Final    OCT, Retina - OU - Both Eyes  Result Date: 05/21/2021 Right Eye Quality was good. Scan locations included subfoveal. Central Foveal Thickness: 355. Progression has been stable. Findings include cystoid macular edema. Left Eye Quality was good. Scan locations included subfoveal. Central Foveal Thickness: 324. Progression has been stable. Findings include epiretinal membrane.   ECHOCARDIOGRAM LIMITED  Result Date: 05/04/2021    ECHOCARDIOGRAM LIMITED REPORT   Patient Name:   Zale S Wilkins Date of Exam: 05/04/2021 Medical Rec #:  [id number]      Height:       67.0 in Accession #:    [id number]     Weight:       217.8 lb Date of Birth:  October 26, 1941      BSA:          2.097 m Patient Age:    62 years       BP:           160/65 mmHg Patient Gender: M              HR:           58 bpm. Exam Location:  Inpatient Procedure: Limited Echo, Limited Color Doppler and Cardiac Doppler Indications:    aortic stenosis. acute diastolic chf.  History:        Patient has prior history of Echocardiogram examinations, most                 recent 02/27/2021. CHF, chronic kidney disease, Aortic Valve                 Disease, Arrythmias:Atrial Flutter; Risk Factors:Sleep Apnea,                 Hypertension and Dyslipidemia.  Sonographer:    Johny Chess Referring Phys: [place]  1. Left ventricular ejection fraction, by estimation, is 45 to 50%. Left ventricular ejection fraction by PLAX is 45 %. The left ventricle has mildly decreased function. There is mild left ventricular hypertrophy. Left ventricular diastolic function could not be evaluated.  2. The aortic valve is tricuspid. Moderate aortic  valve stenosis. Aortic valve area, by VTI measures 1.18 cm. Aortic valve mean gradient measures 17.5 mmHg. Aortic valve Vmax measures 2.82 m/s. DI is 0.38.  3. There is severely elevated pulmonary artery systolic pressure.  4. Tricuspid valve regurgitation is mild to moderate.  5. The inferior vena cava is normal in size with <50% respiratory  variability, suggesting right atrial pressure of 8 mmHg. Comparison(s): Changes from prior study are noted. 02/27/2021: LVEF 40-45%, mild AS - mean gradient 16 mmHg. FINDINGS  Left Ventricle: Left ventricular ejection fraction, by estimation, is 45 to 50%. Left ventricular ejection fraction by PLAX is 45 %. The left ventricle has mildly decreased function. The left ventricular internal cavity size was normal in size. There is  mild left ventricular hypertrophy. Left ventricular diastolic function could not be evaluated. Left ventricular diastolic function could not be evaluated due to atrial fibrillation. Right Ventricle: There is severely elevated pulmonary artery systolic pressure. The tricuspid regurgitant velocity is 4.21 m/s, and with an assumed right atrial pressure of 8 mmHg, the estimated right ventricular systolic pressure is 37.9 mmHg. Tricuspid Valve: The tricuspid valve is grossly normal. Tricuspid valve regurgitation is mild to moderate. Aortic Valve: The aortic valve is tricuspid. Moderate aortic stenosis is present. Aortic valve mean gradient measures 17.5 mmHg. Aortic valve peak gradient measures 31.8 mmHg. Aortic valve area, by VTI measures 1.18 cm. Venous: The inferior vena cava is normal in size with less than 50% respiratory variability, suggesting right atrial pressure of 8 mmHg. EKG: Rhythm strip during this exam demostrated atrial flutter. LEFT VENTRICLE PLAX 2D LV EF:         Left ventricular ejection fraction by PLAX is 45 %. LVIDd:         4.90 cm LVIDs:         3.80 cm LV PW:         1.10 cm LV IVS:        1.00 cm LVOT diam:     2.00 cm LV SV:          72 LV SV Index:   34 LVOT Area:     3.14 cm  IVC IVC diam: 1.90 cm LEFT ATRIUM         Index LA diam:    5.00 cm 2.38 cm/m  AORTIC VALVE AV Area (Vmax):    1.10 cm AV Area (Vmean):   1.09 cm AV Area (VTI):     1.18 cm AV Vmax:           282.00 cm/s AV Vmean:          191.500 cm/s AV VTI:            0.608 m AV Peak Grad:      31.8 mmHg AV Mean Grad:      17.5 mmHg LVOT Vmax:         98.95 cm/s LVOT Vmean:        66.450 cm/s LVOT VTI:          0.229 m LVOT/AV VTI ratio: 0.38 TRICUSPID VALVE TR Peak grad:   70.9 mmHg TR Vmax:        421.00 cm/s  SHUNTS Systemic VTI:  0.23 m Systemic Diam: 2.00 cm Lyman Bishop MD Electronically signed by Lyman Bishop MD Signature Date/Time: 05/04/2021/12:31:32 PM    Final      Recent Labs: Lab Results  Component Value Date   WBC 4.8 05/20/2021   HGB 10.5 (L) 05/20/2021   PLT 104 (L) 05/20/2021   NA 142 05/20/2021   K 4.1 05/20/2021   CL 109 05/20/2021   CO2 25 05/20/2021   GLUCOSE 123 (H) 05/20/2021   BUN 28 (H) 05/20/2021   CREATININE 1.46 (H) 05/20/2021   BILITOT 0.6 05/20/2021   ALKPHOS 69 05/20/2021   AST 15 05/20/2021   ALT 7  05/20/2021   PROT 6.2 (L) 05/20/2021   ALBUMIN 3.5 05/20/2021   CALCIUM 9.2 05/20/2021   GFRAA >60 09/16/2020    Speciality Comments: No specialty comments available.  Procedures:  No procedures performed Allergies: Actos [pioglitazone], Spironolactone, Metformin and related, Niaspan [niacin er], and Wound dressing adhesive   Assessment / Plan:     Visit Diagnoses: Idiopathic chronic gout of multiple sites without tophus - Previous patient of Dr. Charlestine Night and [facility] rheumatology.  He is on allopurinol 100 mg p.o. daily. uric acid: 12/02/2020 7.0.  I will check uric acid level today.  He had some recent labs in June which were stable.  Medication management  DDD (degenerative disc disease), cervical - status post fusion by Dr. Ellene Route with limited range of motion.   DDD (degenerative disc disease), lumbar - Status  post fusion by Dr. Ellene Route.  He denies any lower back discomfort currently.  He is in a wheelchair.  Primary osteoarthritis of right knee - Severe end-stage osteoarthritis and severe chondromalacia patella.  Knee joint was aspirated by Dr. Marlou Sa in the past.  He had no warmth swelling or effusion on my examination today.  He denies discomfort in his knee joints.  Other medical problems are listed as follows:  Essential hypertension  Asymptomatic PVCs  Adenocarcinoma of right lung, stage 4 ([place]) - dxd 08/2020 followed by Dr. Julien Nordmann on chemotherapy.  Coronary artery disease involving native coronary artery of native heart without angina pectoris  Aortic valve sclerosis  Chronic diastolic congestive heart failure (HCC)  NSTEMI (non-ST elevated myocardial infarction) (HCC)  Peptic ulcer with hemorrhage  Stage 3 chronic kidney disease, unspecified whether stage 3a or 3b CKD (HCC)  Neuropathy  Diabetic peripheral neuropathy associated with type 1 diabetes mellitus (HCC)  Thrombocytopenia ([place])  Memory impairment  Obstructive sleep apnea  Orders: Orders Placed This Encounter  Procedures   Uric acid    No orders of the defined types were placed in this encounter.    Follow-Up Instructions: Return in about 6 months (around 11/26/2021) for Gout.   Bo Merino, MD  Note - This record has been created using Editor, commissioning.  Chart creation errors have been sought, but may not always  have been located. Such creation errors do not reflect on  the standard of medical care.

## 2021-05-15 ENCOUNTER — Telehealth: Payer: Self-pay | Admitting: Internal Medicine

## 2021-05-15 NOTE — Telephone Encounter (Signed)
Scheduled appts per 6/2 sch msg. Called pt, no answer. Left msg with appts date and times.

## 2021-05-20 ENCOUNTER — Telehealth: Payer: Self-pay | Admitting: Internal Medicine

## 2021-05-20 ENCOUNTER — Inpatient Hospital Stay: Payer: Medicare Other

## 2021-05-20 ENCOUNTER — Inpatient Hospital Stay: Payer: Medicare Other | Attending: Internal Medicine | Admitting: Internal Medicine

## 2021-05-20 ENCOUNTER — Other Ambulatory Visit: Payer: Self-pay

## 2021-05-20 ENCOUNTER — Encounter: Payer: Self-pay | Admitting: Emergency Medicine

## 2021-05-20 ENCOUNTER — Ambulatory Visit (INDEPENDENT_AMBULATORY_CARE_PROVIDER_SITE_OTHER): Payer: Medicare Other | Admitting: Emergency Medicine

## 2021-05-20 ENCOUNTER — Encounter: Payer: Self-pay | Admitting: Internal Medicine

## 2021-05-20 VITALS — BP 140/55 | HR 61 | Temp 97.1°F | Resp 20 | Ht 67.0 in | Wt 220.5 lb

## 2021-05-20 DIAGNOSIS — C3491 Malignant neoplasm of unspecified part of right bronchus or lung: Secondary | ICD-10-CM

## 2021-05-20 DIAGNOSIS — C3490 Malignant neoplasm of unspecified part of unspecified bronchus or lung: Secondary | ICD-10-CM

## 2021-05-20 DIAGNOSIS — J9601 Acute respiratory failure with hypoxia: Secondary | ICD-10-CM | POA: Diagnosis not present

## 2021-05-20 DIAGNOSIS — R0602 Shortness of breath: Secondary | ICD-10-CM | POA: Diagnosis not present

## 2021-05-20 DIAGNOSIS — I4892 Unspecified atrial flutter: Secondary | ICD-10-CM | POA: Diagnosis not present

## 2021-05-20 DIAGNOSIS — Z79899 Other long term (current) drug therapy: Secondary | ICD-10-CM | POA: Insufficient documentation

## 2021-05-20 DIAGNOSIS — C3412 Malignant neoplasm of upper lobe, left bronchus or lung: Secondary | ICD-10-CM | POA: Diagnosis not present

## 2021-05-20 DIAGNOSIS — C3411 Malignant neoplasm of upper lobe, right bronchus or lung: Secondary | ICD-10-CM | POA: Insufficient documentation

## 2021-05-20 DIAGNOSIS — I10 Essential (primary) hypertension: Secondary | ICD-10-CM

## 2021-05-20 DIAGNOSIS — I4891 Unspecified atrial fibrillation: Secondary | ICD-10-CM | POA: Diagnosis not present

## 2021-05-20 DIAGNOSIS — Z95828 Presence of other vascular implants and grafts: Secondary | ICD-10-CM

## 2021-05-20 DIAGNOSIS — Z5111 Encounter for antineoplastic chemotherapy: Secondary | ICD-10-CM

## 2021-05-20 DIAGNOSIS — G4733 Obstructive sleep apnea (adult) (pediatric): Secondary | ICD-10-CM | POA: Diagnosis not present

## 2021-05-20 DIAGNOSIS — I5032 Chronic diastolic (congestive) heart failure: Secondary | ICD-10-CM

## 2021-05-20 LAB — CBC WITH DIFFERENTIAL (CANCER CENTER ONLY)
Abs Immature Granulocytes: 0.01 10*3/uL (ref 0.00–0.07)
Basophils Absolute: 0 10*3/uL (ref 0.0–0.1)
Basophils Relative: 1 %
Eosinophils Absolute: 0 10*3/uL (ref 0.0–0.5)
Eosinophils Relative: 1 %
HCT: 33.5 % — ABNORMAL LOW (ref 39.0–52.0)
Hemoglobin: 10.5 g/dL — ABNORMAL LOW (ref 13.0–17.0)
Immature Granulocytes: 0 %
Lymphocytes Relative: 17 %
Lymphs Abs: 0.8 10*3/uL (ref 0.7–4.0)
MCH: 28.8 pg (ref 26.0–34.0)
MCHC: 31.3 g/dL (ref 30.0–36.0)
MCV: 92 fL (ref 80.0–100.0)
Monocytes Absolute: 0.6 10*3/uL (ref 0.1–1.0)
Monocytes Relative: 12 %
Neutro Abs: 3.3 10*3/uL (ref 1.7–7.7)
Neutrophils Relative %: 69 %
Platelet Count: 104 10*3/uL — ABNORMAL LOW (ref 150–400)
RBC: 3.64 MIL/uL — ABNORMAL LOW (ref 4.22–5.81)
RDW: 16 % — ABNORMAL HIGH (ref 11.5–15.5)
WBC Count: 4.8 10*3/uL (ref 4.0–10.5)
nRBC: 0 % (ref 0.0–0.2)

## 2021-05-20 LAB — CMP (CANCER CENTER ONLY)
ALT: 7 U/L (ref 0–44)
AST: 15 U/L (ref 15–41)
Albumin: 3.5 g/dL (ref 3.5–5.0)
Alkaline Phosphatase: 69 U/L (ref 38–126)
Anion gap: 8 (ref 5–15)
BUN: 28 mg/dL — ABNORMAL HIGH (ref 8–23)
CO2: 25 mmol/L (ref 22–32)
Calcium: 9.2 mg/dL (ref 8.9–10.3)
Chloride: 109 mmol/L (ref 98–111)
Creatinine: 1.46 mg/dL — ABNORMAL HIGH (ref 0.61–1.24)
GFR, Estimated: 48 mL/min — ABNORMAL LOW (ref >60)
Glucose, Bld: 123 mg/dL — ABNORMAL HIGH (ref 70–99)
Potassium: 4.1 mmol/L (ref 3.5–5.1)
Sodium: 142 mmol/L (ref 135–145)
Total Bilirubin: 0.6 mg/dL (ref 0.3–1.2)
Total Protein: 6.2 g/dL — ABNORMAL LOW (ref 6.5–8.1)

## 2021-05-20 MED ORDER — HEPARIN SOD (PORK) LOCK FLUSH 100 UNIT/ML IV SOLN
500.0000 [IU] | Freq: Once | INTRAVENOUS | Status: AC
Start: 2021-05-20 — End: 2021-05-20
  Administered 2021-05-20: 500 [IU]
  Filled 2021-05-20: qty 5

## 2021-05-20 MED ORDER — SODIUM CHLORIDE 0.9% FLUSH
10.0000 mL | Freq: Once | INTRAVENOUS | Status: AC
  Administered 2021-05-20: 10 mL
  Filled 2021-05-20: qty 10

## 2021-05-20 NOTE — Assessment & Plan Note (Signed)
Multifactorial CHF, most recently with A. fib/flutter.  Has improved significantly post hospitalization and cardioversion.  He follows with Dr. Debara Pickett.

## 2021-05-20 NOTE — Assessment & Plan Note (Signed)
Follow with Dr. Julien Nordmann and get your repeat chest imaging as planned

## 2021-05-20 NOTE — Telephone Encounter (Signed)
Scheduled per los. Confirmed appts. Declined printout

## 2021-05-20 NOTE — Progress Notes (Signed)
Subjective:    Patient ID: Randall Wilkins, male    DOB: March 09, 1941, 80 y.o.   MRN: [id number]  HPI  ROV 09/04/20 --follow-up visit for 80 year old former smoker with multiple medical problems include diabetes, hypertension, hyperlipidemia, CAD/CABG, OSA on CPAP.  He underwent bronchoscopy on 08/06/2020 for bilateral rounded opacities and mediastinal lymphadenopathy.  This showed mucinous adenocarcinoma.  He is planning to start systemic chemotherapy and Keytruda on 9/27.  His breathing is doing ok. He is able to get around some, walk through through the house. No cough or wheeze at this time. His biggest barrier to mobility is his legs and imbalance, falls.   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  He has been treated with carboplatin [place], now [place].  He is preparing to start sotorasib.  He is not currently on any scheduled inhaled medication.  His most recent CT scan of the chest was 01/10/2021 which I have reviewed, shows similar bilateral upper lobe masses with some increased surrounding consolidation, adjacent loculated pleural fluid, increased left lower lobe nodularity.  He has been weak, having difficulty ambulating. He was started on supplemental O2 2L/min at the cancer center after he desaturated. He has not responded to albuterol.   ROV 05/20/21 --follow-up visit for 80 year old man with a history of diabetes, hypertension, hyperlipidemia, CAD/CABG, OSA on CPAP.  He was recently admitted for volume overload and atrial fibrillation. He had to be cardioverted.  I have followed him for stage IV mucinous adenocarcinoma that we diagnosed by bronchoscopy 08/06/2020, remains on sotorasib.  He has not gotten any significant response to albuterol.  We tried empiric Spiriva at his last visit to see if he would get benefit. He remains on this -  difficult to say whether it has helped any. Rarely used albuterol, still unclear whether it is helpful. He is using O2 on 2-3L/min depending on exertion. Has been a long time since he has used his CPAP, is willing to try back on it.   Most recent CT scan of the chest on 03/19/2021 reviewed by me, shows interval improvement in his lingular, right upper lobe and left basilar masslike consolidation consistent with response to treatment, decreased right pleural effusion, trace left pleural effusion, no evidence of metastatic disease in the abdomen or pelvis.  Calcified subcutaneous mass over the left gluteal, partially exophytic mass of the lateral midportion of the left kidney.   Review of Systems As per HPI     Objective:   Physical Exam Vitals:   05/20/21 1406  BP: 118/76  Pulse: (!) 54  Temp: 97.8 F (36.6 C)  TempSrc: Temporal  SpO2: 94%  Weight: 220 lb 12.8 oz (100.2 kg)  Height: 5\' 7"  (1.702 m)   Gen: Pleasant, obese, in no distress,  normal affect  ENT: No lesions,  mouth clear,  oropharynx clear, no postnasal drip  Neck: No JVD, no stridor  Lungs: No use of accessory muscles, scattered bilateral rhonchi, no wheezing  Cardiovascular: RRR, heart sounds normal, no murmur or gallops, no peripheral edema  Musculoskeletal: No deformities, brace on his LLE  Neuro: alert, awake, non focal  Skin: Warm, no lesions or rash       Assessment & Plan:  Adenocarcinoma of right lung, stage 4 (HCC) Follow with Dr. Julien Nordmann and get your repeat chest imaging as planned  Acute respiratory failure with hypoxia Agree with continuing oxygen at 2  L/min, up to 3 L/min with physical therapy  Obstructive sleep apnea Agree with getting back on your CPAP every night.  Try to wear this reliably.  Shortness of breath Unclear whether there was a significant contribution from COPD.  He did try Spiriva, still using it with unclear effect.  We will do a trial off of it to see if he misses it.  If  so we can restart.  He has albuterol that he can use as needed.  Chronic diastolic congestive heart failure (HCC) Multifactorial CHF, most recently with A. fib/flutter.  Has improved significantly post hospitalization and cardioversion.  He follows with Dr. Debara Pickett.  Baltazar Apo, MD, PhD 05/20/2021, 2:56 PM [facility] [phone] or if no answer [phone]

## 2021-05-20 NOTE — Patient Instructions (Signed)
We will try stopping your Spiriva to see if you miss it.  If your breathing worsens off this medication then we can restart it. Keep albuterol available to use 2 puffs if needed for shortness of breath, chest tightness, wheezing. Agree with continuing oxygen at 2 L/min, up to 3 L/min with physical therapy Agree with getting back on your CPAP every night.  Try to wear this reliably. Follow with Dr. Julien Nordmann and get your repeat chest imaging as planned Follow Dr. Debara Pickett as planned Follow with Dr Lamonte Sakai in 6 months or sooner if you have any problems

## 2021-05-20 NOTE — Progress Notes (Signed)
[place] [phone]   [phone]  OFFICE PROGRESS NOTE  [name], MD [address] [place] [address]  DIAGNOSIS: Stage IV (T4, N2, M1 a) non-small cell lung cancer, mucinous adenocarcinoma presented with bilateral large masses in the left upper lobe as well as the right lower lobe in addition to other small nodules in the right upper lobe with hilar and mediastinal lymphadenopathy diagnosed in August 2021.  Biomarker Findings Microsatellite status - MS-Stable Tumor Mutational Burden - 4 Muts/Mb Genomic Findings For a complete list of the genes assayed, please refer to the Appendix. KRAS G12C GNAS R201H 7 Disease relevant genes with no reportable alterations: ALK, BRAF, EGFR, ERBB2, MET, RET, ROS1  PDL1 Expression: 20%.  PRIOR THERAPY: Systemic chemotherapy with carboplatin for AUC of 5, Alimta 500 mg/M2 and Keytruda 200 mg IV every 3 weeks.  First dose 09/09/2020.  Status post 6 cycles.  Starting from cycle #5 he has been on maintenance treatment with Alimta and Keytruda.  CURRENT THERAPY: Lumakras (Sotorasib) 960 mg p.o. daily.  First dose started 01/25/2021.  Status post 4 months of treatment.  INTERVAL HISTORY: Randall Wilkins 80 y.o. male returns to the clinic today for follow-up visit accompanied by his wife.  The patient was admitted to the hospital recently with atrial fibrillation/flutter and he had cardioversion done at that time.  He is also on blood thinner.  He continues to tolerate his treatment with Lumakras (Sotorasib) fairly well except for itching.  He denied having any current chest pain, shortness of breath except with exertion with no cough or hemoptysis.  He denied having any fever or chills.  He has no nausea, vomiting, diarrhea or constipation.  He has no headache or visual changes.  The patient is here today for evaluation and repeat blood work.   MEDICAL HISTORY: Past Medical History:   Diagnosis Date  . Aortic stenosis, mild 11/14/2013  . Aortic valve sclerosis 03/29/2015  . Bilateral leg edema 05/21/2014  . CAD (coronary artery disease)   . Chronic diastolic congestive heart failure ([place])   . Chronic kidney disease (CKD), stage III (moderate) (HCC)   . Diabetes 1.5, managed as type 1 ([place]) 02/04/2013  . Diabetic peripheral neuropathy associated with type 1 diabetes mellitus ([place]) 11/14/2013  . Dysphagia   . Dyspnea on exertion 03/21/2015  . Exogenous obesity   . Gout   . Heart attack ([place])   . History of nuclear stress test 07/2011   dipyridamole; fixed inferolateral defect, worse at stress than rest; no reversible ischemia; low risk scan   . Hyperlipidemia   . Hypertension   . Hypothyroidism   . Insulin dependent diabetes mellitus   . Left foot drop   . Left main coronary artery disease 03/28/2015  . lung ca dx'd 08/2020  . Memory loss   . Obesity (BMI 30-39.9) 11/14/2013  . Obstructive sleep apnea 03/21/2015  . OSA on CPAP    uses a cpap  . Peptic ulcer with hemorrhage 03/28/2015  . Peripheral neuropathy   . Pneumonia   . Rhabdomyolysis   . 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 Stroke ([place])    L patietal with small scattered lacunar infarcts  . Thrombocytopenia ([place]) 03/21/2015  . Venous insufficiency   . Weakness generalized 03/21/2015    ALLERGIES:  is allergic to actos [pioglitazone], spironolactone, metformin and related, niaspan [niacin  er], and wound dressing adhesive.  MEDICATIONS:  Current Outpatient Medications  Medication Sig Dispense Refill  . albuterol (VENTOLIN HFA) 108 (90 Base) MCG/ACT inhaler Inhale 2 puffs into the lungs every 6 (six) hours as needed for wheezing or shortness of breath. 8 g 6  . allopurinol (ZYLOPRIM) 100 MG tablet TAKE ONE TABLET EVERY DAY 30 tablet 6  . amLODipine (NORVASC) 10 MG tablet TAKE ONE TABLET BY MOUTH ONCE DAILY 90 tablet 3  . apixaban (ELIQUIS) 5 MG TABS tablet Take 1 tablet (5  mg total) by mouth 2 (two) times daily. 60 tablet 11  . Blood Glucose Monitoring Suppl (CONTOUR NEXT MONITOR) w/Device KIT 1 Device by Does not apply route 3 (three) times daily. Use to check blood sugars 3 times daily. Dx Code E13.9 1 kit 2  . CONTOUR NEXT TEST test strip USE 1 STRIP TO CHECK GLUCOSE 4 TIMES DAILY 300 each 1  . furosemide (LASIX) 20 MG tablet Take 2 tablets (40 mg total) by mouth 2 (two) times daily. 450 tablet 2  . hydrALAZINE (APRESOLINE) 25 MG tablet Take 1.5 tablets (37.5 mg total) by mouth in the morning and at bedtime. 270 tablet 3  . insulin degludec (TRESIBA FLEXTOUCH) 200 UNIT/ML FlexTouch Pen Inject 12 Units into the skin at bedtime.    . insulin lispro (HUMALOG) 100 UNIT/ML cartridge Inject 5-15 Units into the skin 3 (three) times daily with meals. Sliding scale.    . Insulin Pen Needle (EASY COMFORT PEN NEEDLES) 33G X 4 MM MISC 1 each by Does not apply route See admin instructions. Use to inject insulin 5 times daily. 150 each 3  . isosorbide dinitrate (ISORDIL) 20 MG tablet Take 1 tablet (20 mg total) by mouth in the morning and at bedtime. 180 tablet 3  . ketorolac (ACULAR) 0.5 % ophthalmic solution Place 1 drop into the right eye 4 (four) times daily. 5 mL 4  . levothyroxine (SYNTHROID, LEVOTHROID) 75 MCG tablet Take 75 mcg by mouth daily before breakfast.   1  . lidocaine-prilocaine (EMLA) cream APPLY A TEASPOON OVER PORT SITE AT LEASTONE HOUR BEFORE LAB APPOINTMENT. DO NOT RUB IN AND COVER WITH PLASTIC WRAP (Patient taking differently: Apply 1 application topically See admin instructions. Apply a teaspoon over port site at least one hour before lab appointment. Do not rub in and cover with plastic wrap.) 30 g 0  . lisinopril (ZESTRIL) 10 MG tablet TAKE ONE TABLET DAILY (Patient taking differently: Take 10 mg by mouth daily.) 90 tablet 2  . metFORMIN (GLUCOPHAGE-XR) 750 MG 24 hr tablet TAKE 1 TABLET WITH DINNER (Patient taking differently: Take 750 mg by mouth daily  with breakfast. Take 1 tablet with dinner) 90 tablet 1  . ondansetron (ZOFRAN) 4 MG tablet Take 1 tablet (4 mg total) by mouth every 6 (six) hours as needed for nausea. 20 tablet 0  . OXYGEN Inhale 2 L into the lungs. At bedtime and during the day prn    . potassium chloride SA (K-DUR,KLOR-CON) 20 MEQ tablet Take 20 mEq by mouth 2 (two) times daily.     . prochlorperazine (COMPAZINE) 10 MG tablet Take 1 tablet (10 mg total) by mouth every 6 (six) hours as needed for nausea or vomiting. 30 tablet 0  . rosuvastatin (CRESTOR) 20 MG tablet Take 20 mg by mouth daily.    . sotorasib 120 MG TABS TAKE 960 MG BY MOUTH EVERY MORNING. (Patient taking differently: Take 960 mg by mouth daily.) 240 tablet 3  .  Tamsulosin HCl (FLOMAX) 0.4 MG CAPS Take 0.4 mg by mouth at bedtime.    . Tiotropium Bromide Monohydrate (SPIRIVA RESPIMAT) 2.5 MCG/ACT AERS Inhale 2 puffs into the lungs daily. 4 g 3  . vitamin B-12 (CYANOCOBALAMIN) 1000 MCG tablet Take 1,000 mcg by mouth at bedtime.     No current facility-administered medications for this visit.    SURGICAL HISTORY:  Past Surgical History:  Procedure Laterality Date  . BACK SURGERY  2002   lumbosacral. 11 back surgeries total  . BRONCHIAL BIOPSY  08/06/2020   Procedure: BRONCHIAL BIOPSIES;  Surgeon: Collene Gobble, MD;  Location: [facility] ENDOSCOPY;  Service: Pulmonary;;  . BRONCHIAL BRUSHINGS  08/06/2020   Procedure: BRONCHIAL BRUSHINGS;  Surgeon: Collene Gobble, MD;  Location: [facility] ENDOSCOPY;  Service: Pulmonary;;  . BRONCHIAL NEEDLE ASPIRATION BIOPSY  08/06/2020   Procedure: BRONCHIAL NEEDLE ASPIRATION BIOPSIES;  Surgeon: Collene Gobble, MD;  Location: [place];  Service: Pulmonary;;  . CARDIOVERSION N/A 05/06/2021   Procedure: CARDIOVERSION;  Surgeon: Geralynn Rile, MD;  Location: [place];  Service: Cardiovascular;  Laterality: N/A;  . Carotid Doppler  03/2013   bilat bulb/prox ICAs - mild amount of fibrous plaque with no evidence of diameter  reduction  . CARPAL TUNNEL RELEASE Bilateral 08/09/2014   Procedure: BILATERAL CARPAL TUNNEL RELEASE;  Surgeon: Daryll Brod, MD;  Location: [place];  Service: Orthopedics;  Laterality: Bilateral;  ANESTHESIA:  IV REGIONAL BIL FAB  . CHOLECYSTECTOMY    . COLONOSCOPY    . CORONARY ANGIOPLASTY  10/13/1996  . CORONARY ANGIOPLASTY  09/21/1989   emergency PTCA  . CORONARY ANGIOPLASTY  10/13/1996   Multi-Link diagonal & OD stenting (Dr. Marella Chimes)  . CORONARY ANGIOPLASTY  12/03/1997   disease of mid DX-1 ~50% & in mid PLA & PDA (distal lesions) (Dr. Marella Chimes)   . CORONARY ANGIOPLASTY  10/14/1999   progression of disease distal PLA & PDA; progression of disease prox RCA - moderate (Dr. Marella Chimes)   . CORONARY ANGIOPLASTY WITH STENT PLACEMENT  04/04/2004   4.0x64m non-DES (thrombectomy via AngioJet) to RCA for high grade stenosis (Dr. RMarella Chimes  . CORONARY ARTERY BYPASS GRAFT N/A 03/29/2015   Procedure: CORONARY ARTERY BYPASS GRAFTING TIMES TWO USING LEFT INTERNAL MAMMARY ARTERY AND RIGHT LEG GREATER SAPHENOUS VEIN HARVESTED ENDOSCOPICALLY.;  Surgeon: CRexene Alberts MD;  [place]  Service: Open Heart Surgery;  Laterality: N/A;  . ESOPHAGOGASTRODUODENOSCOPY N/A 03/27/2015   Procedure: ESOPHAGOGASTRODUODENOSCOPY (EGD);  Surgeon: MClarene Essex MD;  [facility];  Service: Endoscopy;  Laterality: N/A;  possible dilation  . ESOPHAGOGASTRODUODENOSCOPY N/A 10/05/2020   Procedure: ESOPHAGOGASTRODUODENOSCOPY (EGD);  Surgeon: OArta Silence MD;  [facility];  Service: Endoscopy;  Laterality: N/A;  . FINE NEEDLE ASPIRATION  08/06/2020   Procedure: FINE NEEDLE ASPIRATION (FNA) LINEAR;  Surgeon: BCollene Gobble MD;  [facility];  Service: Pulmonary;;  . IR IMAGING GUIDED PORT INSERTION  10/07/2020  . LEFT HEART CATHETERIZATION WITH CORONARY ANGIOGRAM N/A 03/28/2015   Procedure: LEFT HEART CATHETERIZATION WITH CORONARY ANGIOGRAM;  Surgeon: Peter  M JMartinique MD;  [facility];  Service: Cardiovascular;  Laterality: N/A;  . SINUS ENDO W/FUSION    . TEE WITHOUT CARDIOVERSION N/A 05/06/2021   Procedure: TRANSESOPHAGEAL ECHOCARDIOGRAM (TEE);  Surgeon: OGeralynn Rile MD;  [place]  Service: Cardiovascular;  Laterality: N/A;  . TONSILLECTOMY    . TRANSTHORACIC ECHOCARDIOGRAM  08/08/2013   EF 55-60%, mild conc hypertrophy, grade 1 diastolic  dysfunction; AV with mild stenosis; LA & RA mildly dilated  . VIDEO BRONCHOSCOPY WITH ENDOBRONCHIAL NAVIGATION N/A 08/06/2020   Procedure: VIDEO BRONCHOSCOPY WITH ENDOBRONCHIAL NAVIGATION;  Surgeon: Collene Gobble, MD;  Location: [place] ENDOSCOPY;  Service: Pulmonary;  Laterality: N/A;  . VIDEO BRONCHOSCOPY WITH ENDOBRONCHIAL ULTRASOUND N/A 08/06/2020   Procedure: VIDEO BRONCHOSCOPY WITH ENDOBRONCHIAL ULTRASOUND;  Surgeon: Collene Gobble, MD;  Location: [facility] ENDOSCOPY;  Service: Pulmonary;  Laterality: N/A;    REVIEW OF SYSTEMS:  A comprehensive review of systems was negative except for: Constitutional: positive for fatigue Respiratory: positive for dyspnea on exertion   PHYSICAL EXAMINATION: General appearance: alert, cooperative, fatigued and no distress Head: Normocephalic, without obvious abnormality, atraumatic Neck: no adenopathy, no JVD, supple, symmetrical, trachea midline and thyroid not enlarged, symmetric, no tenderness/mass/nodules Lymph nodes: Cervical, supraclavicular, and axillary nodes normal. Resp: clear to auscultation bilaterally Back: symmetric, no curvature. ROM normal. No CVA tenderness. Cardio: regular rate and rhythm, S1, S2 normal, no murmur, click, rub or gallop GI: soft, non-tender; bowel sounds normal; no masses,  no organomegaly Extremities: extremities normal, atraumatic, no cyanosis or edema  ECOG PERFORMANCE STATUS: 1 - Symptomatic but completely ambulatory  Blood pressure (!) 140/55, pulse 61, temperature (!) 97.1 F (36.2 C), temperature source  Tympanic, resp. rate 20, height 5' 7" (1.702 m), weight 220 lb 8 oz (100 kg), SpO2 91 %.  LABORATORY DATA: Lab Results  Component Value Date   WBC 4.8 05/20/2021   HGB 10.5 (L) 05/20/2021   HCT 33.5 (L) 05/20/2021   MCV 92.0 05/20/2021   PLT 104 (L) 05/20/2021      Chemistry      Component Value Date/Time   NA 142 05/07/2021 0551   K 4.3 05/07/2021 0551   CL 104 05/07/2021 0551   CO2 29 05/07/2021 0551   BUN 23 05/07/2021 0551   CREATININE 1.57 (H) 05/07/2021 0551   CREATININE 1.47 (H) 04/08/2021 1411   CREATININE 1.30 (H) 04/30/2020 1044      Component Value Date/Time   CALCIUM 9.2 05/07/2021 0551   ALKPHOS 69 05/06/2021 0349   AST 15 05/06/2021 0349   AST 18 04/08/2021 1411   ALT 10 05/06/2021 0349   ALT 11 04/08/2021 1411   BILITOT 0.5 05/06/2021 0349   BILITOT 0.7 04/08/2021 1411       RADIOGRAPHIC STUDIES: DG Chest 1 View  Result Date: 05/01/2021 CLINICAL DATA:  Shortness of breath. EXAM: CHEST  1 VIEW COMPARISON:  March 14, 2021 FINDINGS: Stable cardiomediastinal silhouette. Sternotomy wires are noted. No pneumothorax is noted. Right internal jugular Port-A-Cath is unchanged in position. Mild bibasilar atelectasis or infiltrates are noted. Small pleural effusions may be present. Bony thorax is unremarkable. IMPRESSION: Mild bibasilar subsegmental atelectasis or infiltrates. Possible small pleural effusions. Electronically Signed   By: Marijo Conception M.D.   On: 05/01/2021 17:06   ECHO TEE  Result Date: 05/06/2021    TRANSESOPHOGEAL ECHO REPORT   Patient Name:   Randall Wilkins Date of Exam: 05/06/2021 Medical Rec #:  [id number]      Height:       67.0 in Accession #:    [id number]     Weight:       218.9 lb Date of Birth:  1941-01-20      BSA:          2.101 m Patient Age:    80 years       BP:  131/43 mmHg Patient Gender: M              HR:           66 bpm. Exam Location:  Inpatient Procedure: Transesophageal Echo, 3D Echo, Cardiac Doppler and Color Doppler  Indications:     Symptomatic Atrial Flutter  History:         Patient has prior history of Echocardiogram examinations, most                  recent 05/04/2021.  Sonographer:     Philipp Deputy Referring Phys:  [id number] [place] Diagnosing Phys: Eleonore Chiquito MD PROCEDURE: After discussion of the risks and benefits of a TEE, an informed consent was obtained from the patient. TEE procedure time was 30 minutes. The transesophogeal probe was passed without difficulty through the esophogus of the patient. Imaged were obtained with the patient in a left lateral decubitus position. Local oropharyngeal anesthetic was provided with Cetacaine. Sedation performed by different physician. The patient was monitored while under deep sedation. Anesthestetic sedation was provided intravenously by Anesthesiology: 241m of Propofol. Image quality was excellent. The patient's vital signs; including heart rate, blood pressure, and oxygen saturation; remained stable throughout the procedure. The patient developed no complications during the procedure. IMPRESSIONS  1. No LAA thrombus. Successful DCCV with return to NSR.  2. The aortic valve is tricuspid. Aortic valve regurgitation is trivial. Moderate aortic valve stenosis. Aortic valve area, by VTI measures 1.11 cm. Aortic valve mean gradient measures 23.9 mmHg. Aortic valve Vmax measures 3.35 m/s.  3. Left ventricular ejection fraction, by estimation, is 55 to 60%. The left ventricle has normal function.  4. Right ventricular systolic function is normal. The right ventricular size is normal.  5. Left atrial size was mildly dilated. No left atrial/left atrial appendage thrombus was detected. The LAA emptying velocity was 66 cm/s.  6. The mitral valve is grossly normal. Trivial mitral valve regurgitation. No evidence of mitral stenosis.  7. There is mild (Grade II) layered plaque involving the descending aorta. FINDINGS  Left Ventricle: Left ventricular ejection fraction, by  estimation, is 55 to 60%. The left ventricle has normal function. The left ventricular internal cavity size was normal in size. Right Ventricle: The right ventricular size is normal. No increase in right ventricular wall thickness. Right ventricular systolic function is normal. Left Atrium: Left atrial size was mildly dilated. No left atrial/left atrial appendage thrombus was detected. The LAA emptying velocity was 66 cm/s. Right Atrium: Right atrial size was normal in size. Pericardium: There is no evidence of pericardial effusion. Mitral Valve: The mitral valve is grossly normal. Trivial mitral valve regurgitation. No evidence of mitral valve stenosis. Tricuspid Valve: The tricuspid valve is normal in structure. Tricuspid valve regurgitation is mild . No evidence of tricuspid stenosis. Aortic Valve: The aortic valve is tricuspid. Aortic valve regurgitation is trivial. Moderate aortic stenosis is present. Aortic valve mean gradient measures 23.9 mmHg. Aortic valve peak gradient measures 44.9 mmHg. Aortic valve area, by VTI measures 1.11  cm. Pulmonic Valve: The pulmonic valve was grossly normal. Pulmonic valve regurgitation is not visualized. No evidence of pulmonic stenosis. Aorta: The aortic root and ascending aorta are structurally normal, with no evidence of dilitation. There is mild (Grade II) layered plaque involving the descending aorta. Venous: The left upper pulmonary vein and right upper pulmonary vein are normal. IAS/Shunts: No atrial level shunt detected by color flow Doppler. Additional Comments: A is visualized in the superior  vena cava and right atrium.  LEFT VENTRICLE PLAX 2D LVOT diam:     2.20 cm LV SV:         79 LV SV Index:   38 LVOT Area:     3.80 cm  AORTIC VALVE AV Area (Vmax):    1.11 cm AV Area (Vmean):   1.18 cm AV Area (VTI):     1.11 cm AV Vmax:           335.08 cm/s AV Vmean:          228.598 cm/s AV VTI:            0.715 m AV Peak Grad:      44.9 mmHg AV Mean Grad:      23.9 mmHg  LVOT Vmax:         98.13 cm/s LVOT Vmean:        71.090 cm/s LVOT VTI:          0.208 m LVOT/AV VTI ratio: 0.29  AORTA Ao Root diam: 3.20 cm Ao Asc diam:  3.30 cm  SHUNTS Systemic VTI:  0.21 m Systemic Diam: 2.20 cm Eleonore Chiquito MD Electronically signed by Eleonore Chiquito MD Signature Date/Time: 05/06/2021/4:14:06 PM    Final    ECHOCARDIOGRAM LIMITED  Result Date: 05/04/2021    ECHOCARDIOGRAM LIMITED REPORT   Patient Name:   Randall Wilkins Date of Exam: 05/04/2021 Medical Rec #:  [id number]      Height:       67.0 in Accession #:    [id number]     Weight:       217.8 lb Date of Birth:  Dec 13, 1941      BSA:          2.097 m Patient Age:    60 years       BP:           160/65 mmHg Patient Gender: M              HR:           58 bpm. Exam Location:  Inpatient Procedure: Limited Echo, Limited Color Doppler and Cardiac Doppler Indications:    aortic stenosis. acute diastolic chf.  History:        Patient has prior history of Echocardiogram examinations, most                 recent 02/27/2021. CHF, chronic kidney disease, Aortic Valve                 Disease, Arrythmias:Atrial Flutter; Risk Factors:Sleep Apnea,                 Hypertension and Dyslipidemia.  Sonographer:    Johny Chess Referring Phys: [place]  1. Left ventricular ejection fraction, by estimation, is 45 to 50%. Left ventricular ejection fraction by PLAX is 45 %. The left ventricle has mildly decreased function. There is mild left ventricular hypertrophy. Left ventricular diastolic function could not be evaluated.  2. The aortic valve is tricuspid. Moderate aortic valve stenosis. Aortic valve area, by VTI measures 1.18 cm. Aortic valve mean gradient measures 17.5 mmHg. Aortic valve Vmax measures 2.82 m/s. DI is 0.38.  3. There is severely elevated pulmonary artery systolic pressure.  4. Tricuspid valve regurgitation is mild to moderate.  5. The inferior vena cava is normal in size with <50% respiratory variability, suggesting  right atrial pressure of 8 mmHg. Comparison(s): Changes from prior study are noted.  02/27/2021: LVEF 40-45%, mild AS - mean gradient 16 mmHg. FINDINGS  Left Ventricle: Left ventricular ejection fraction, by estimation, is 45 to 50%. Left ventricular ejection fraction by PLAX is 45 %. The left ventricle has mildly decreased function. The left ventricular internal cavity size was normal in size. There is  mild left ventricular hypertrophy. Left ventricular diastolic function could not be evaluated. Left ventricular diastolic function could not be evaluated due to atrial fibrillation. Right Ventricle: There is severely elevated pulmonary artery systolic pressure. The tricuspid regurgitant velocity is 4.21 m/s, and with an assumed right atrial pressure of 8 mmHg, the estimated right ventricular systolic pressure is 35.5 mmHg. Tricuspid Valve: The tricuspid valve is grossly normal. Tricuspid valve regurgitation is mild to moderate. Aortic Valve: The aortic valve is tricuspid. Moderate aortic stenosis is present. Aortic valve mean gradient measures 17.5 mmHg. Aortic valve peak gradient measures 31.8 mmHg. Aortic valve area, by VTI measures 1.18 cm. Venous: The inferior vena cava is normal in size with less than 50% respiratory variability, suggesting right atrial pressure of 8 mmHg. EKG: Rhythm strip during this exam demostrated atrial flutter. LEFT VENTRICLE PLAX 2D LV EF:         Left ventricular ejection fraction by PLAX is 45 %. LVIDd:         4.90 cm LVIDs:         3.80 cm LV PW:         1.10 cm LV IVS:        1.00 cm LVOT diam:     2.00 cm LV SV:         72 LV SV Index:   34 LVOT Area:     3.14 cm  IVC IVC diam: 1.90 cm LEFT ATRIUM         Index LA diam:    5.00 cm 2.38 cm/m  AORTIC VALVE AV Area (Vmax):    1.10 cm AV Area (Vmean):   1.09 cm AV Area (VTI):     1.18 cm AV Vmax:           282.00 cm/s AV Vmean:          191.500 cm/s AV VTI:            0.608 m AV Peak Grad:      31.8 mmHg AV Mean Grad:      17.5  mmHg LVOT Vmax:         98.95 cm/s LVOT Vmean:        66.450 cm/s LVOT VTI:          0.229 m LVOT/AV VTI ratio: 0.38 TRICUSPID VALVE TR Peak grad:   70.9 mmHg TR Vmax:        421.00 cm/s  SHUNTS Systemic VTI:  0.23 m Systemic Diam: 2.00 cm Lyman Bishop MD Electronically signed by Lyman Bishop MD Signature Date/Time: 05/04/2021/12:31:32 PM    Final     ASSESSMENT AND PLAN: This is a very pleasant 80 years old white male diagnosed with a stage IV (T4, N2, M1 a) non-small cell lung cancer, mucinous adenocarcinoma presented with bilateral large masses in the left upper lobe as well as the right upper lobe in addition to other pulmonary nodules in the right upper lobe with hilar and mediastinal lymphadenopathy diagnosed in August 2021. Molecular study showed positive KRAS G12C mutation and PD-L1 expression of 20%. The patient is currently undergoing systemic chemotherapy with carboplatin for AUC of 5, Alimta 500 mg/M2 and Keytruda 200 mg IV  every 3 weeks status post 6 cycles. Starting from cycle #5 the patient is on maintenance treatment with Alimta and Keytruda every 3 weeks.  His treatment was discontinued secondary to disease progression.  The patient is currently on oral treatment with Lumakras (Sotorasib) 960 mg p.o. daily started on 01/25/2021.  Status post 4 months of treatment and has been tolerating this treatment well except for itching. I recommended for him to continue his current treatment with Lumakras (Sotorasib) with the same dose. I will see him back for follow-up in 1 months with repeat CT scan of the chest, abdomen pelvis for restaging of his disease. The patient was advised to call immediately if he has any concerning symptoms in the interval. The patient voices understanding of current disease status and treatment options and is in agreement with the current care plan.  All questions were answered. The patient knows to call the clinic with any problems, questions or concerns. We can  certainly see the patient much sooner if necessary.  Disclaimer: This note was dictated with voice recognition software. Similar sounding words can inadvertently be transcribed and may not be corrected upon review.

## 2021-05-20 NOTE — Assessment & Plan Note (Signed)
Agree with continuing oxygen at 2 L/min, up to 3 L/min with physical therapy

## 2021-05-20 NOTE — Assessment & Plan Note (Signed)
Unclear whether there was a significant contribution from COPD.  He did try Spiriva, still using it with unclear effect.  We will do a trial off of it to see if he misses it.  If so we can restart.  He has albuterol that he can use as needed.

## 2021-05-20 NOTE — Assessment & Plan Note (Signed)
Agree with getting back on your CPAP every night.  Try to wear this reliably.

## 2021-05-21 ENCOUNTER — Encounter (INDEPENDENT_AMBULATORY_CARE_PROVIDER_SITE_OTHER): Payer: Self-pay | Admitting: Ophthalmology

## 2021-05-21 ENCOUNTER — Ambulatory Visit (INDEPENDENT_AMBULATORY_CARE_PROVIDER_SITE_OTHER): Payer: Medicare Other | Admitting: Ophthalmology

## 2021-05-21 DIAGNOSIS — H35372 Puckering of macula, left eye: Secondary | ICD-10-CM | POA: Diagnosis not present

## 2021-05-21 DIAGNOSIS — H35371 Puckering of macula, right eye: Secondary | ICD-10-CM

## 2021-05-21 DIAGNOSIS — G4733 Obstructive sleep apnea (adult) (pediatric): Secondary | ICD-10-CM | POA: Diagnosis not present

## 2021-05-21 DIAGNOSIS — I4891 Unspecified atrial fibrillation: Secondary | ICD-10-CM | POA: Diagnosis not present

## 2021-05-21 DIAGNOSIS — C3490 Malignant neoplasm of unspecified part of unspecified bronchus or lung: Secondary | ICD-10-CM | POA: Diagnosis not present

## 2021-05-21 DIAGNOSIS — H35351 Cystoid macular degeneration, right eye: Secondary | ICD-10-CM | POA: Diagnosis not present

## 2021-05-21 DIAGNOSIS — I2510 Atherosclerotic heart disease of native coronary artery without angina pectoris: Secondary | ICD-10-CM | POA: Diagnosis not present

## 2021-05-21 DIAGNOSIS — I5043 Acute on chronic combined systolic (congestive) and diastolic (congestive) heart failure: Secondary | ICD-10-CM | POA: Diagnosis not present

## 2021-05-21 NOTE — Assessment & Plan Note (Signed)
Chronic and recurrent likely on the basis of hypoxic damage from poor compliance with CPAP use.  Patient is using NSAID likely 3 or 4 times daily with stability of findings but no improvement.  I have encouraged maximal CPAP compliance if possible

## 2021-05-21 NOTE — Progress Notes (Signed)
05/21/2021     CHIEF COMPLAINT Patient presents for Retina Follow Up (7 Wk F/U OD//Pt sts [facility] OD is "so so." No changes reported OU. Pt sts he is still using ketorolac TID OD. )   HISTORY OF PRESENT ILLNESS: Randall Wilkins is a 80 y.o. male who presents to the clinic today for:   HPI    Retina Follow Up    Diagnosis: Other   Laterality: right eye   Onset: 7 weeks ago   Severity: mild   Duration: 7 weeks   Course: stable   Comments: 7 Wk F/U OD  Pt sts VA OD is "so so." No changes reported OU. Pt sts he is still using ketorolac TID OD.        Last edited by Rockie Neighbours, [place] on 05/21/2021  9:28 AM. (History)      Referring physician: [name], MD [address],  [place] [address]  HISTORICAL INFORMATION:   Selected notes from the MEDICAL RECORD NUMBER    Lab Results  Component Value Date   HGBA1C 6.0 (H) 05/02/2021     CURRENT MEDICATIONS: Current Outpatient Medications (Ophthalmic Drugs)  Medication Sig  . ketorolac (ACULAR) 0.5 % ophthalmic solution Place 1 drop into the right eye 4 (four) times daily.   No current facility-administered medications for this visit. (Ophthalmic Drugs)   Current Outpatient Medications (Other)  Medication Sig  . albuterol (VENTOLIN HFA) 108 (90 Base) MCG/ACT inhaler Inhale 2 puffs into the lungs every 6 (six) hours as needed for wheezing or shortness of breath.  . allopurinol (ZYLOPRIM) 100 MG tablet TAKE ONE TABLET EVERY DAY  . amLODipine (NORVASC) 10 MG tablet TAKE ONE TABLET BY MOUTH ONCE DAILY  . apixaban (ELIQUIS) 5 MG TABS tablet Take 1 tablet (5 mg total) by mouth 2 (two) times daily.  . Blood Glucose Monitoring Suppl (CONTOUR NEXT MONITOR) w/Device KIT 1 Device by Does not apply route 3 (three) times daily. Use to check blood sugars 3 times daily. Dx Code E13.9  . CONTOUR NEXT TEST test strip USE 1 STRIP TO CHECK GLUCOSE 4 TIMES DAILY  . furosemide (LASIX) 20 MG tablet Take 2 tablets (40 mg  total) by mouth 2 (two) times daily.  . hydrALAZINE (APRESOLINE) 25 MG tablet Take 1.5 tablets (37.5 mg total) by mouth in the morning and at bedtime.  . insulin degludec (TRESIBA FLEXTOUCH) 200 UNIT/ML FlexTouch Pen Inject 12 Units into the skin at bedtime.  . insulin lispro (HUMALOG) 100 UNIT/ML cartridge Inject 5-15 Units into the skin 3 (three) times daily with meals. Sliding scale.  . Insulin Pen Needle (EASY COMFORT PEN NEEDLES) 33G X 4 MM MISC 1 each by Does not apply route See admin instructions. Use to inject insulin 5 times daily.  . isosorbide dinitrate (ISORDIL) 20 MG tablet Take 1 tablet (20 mg total) by mouth in the morning and at bedtime.  Marland Kitchen levothyroxine (SYNTHROID, LEVOTHROID) 75 MCG tablet Take 75 mcg by mouth daily before breakfast.   . lidocaine-prilocaine (EMLA) cream APPLY A TEASPOON OVER PORT SITE AT LEASTONE HOUR BEFORE LAB APPOINTMENT. DO NOT RUB IN AND COVER WITH PLASTIC WRAP (Patient taking differently: Apply 1 application topically See admin instructions. Apply a teaspoon over port site at least one hour before lab appointment. Do not rub in and cover with plastic wrap.)  . lisinopril (ZESTRIL) 10 MG tablet TAKE ONE TABLET DAILY (Patient taking differently: Take 10 mg by mouth daily.)  . metFORMIN (  GLUCOPHAGE-XR) 750 MG 24 hr tablet TAKE 1 TABLET WITH DINNER (Patient taking differently: Take 750 mg by mouth daily with breakfast. Take 1 tablet with dinner)  . ondansetron (ZOFRAN) 4 MG tablet Take 1 tablet (4 mg total) by mouth every 6 (six) hours as needed for nausea.  . OXYGEN Inhale 2 L into the lungs. At bedtime and during the day prn  . potassium chloride SA (K-DUR,KLOR-CON) 20 MEQ tablet Take 20 mEq by mouth 2 (two) times daily.   . prochlorperazine (COMPAZINE) 10 MG tablet Take 1 tablet (10 mg total) by mouth every 6 (six) hours as needed for nausea or vomiting.  . rosuvastatin (CRESTOR) 20 MG tablet Take 20 mg by mouth daily.  . sotorasib 120 MG TABS TAKE 960 MG BY  MOUTH EVERY MORNING. (Patient taking differently: Take 960 mg by mouth daily.)  . Tamsulosin HCl (FLOMAX) 0.4 MG CAPS Take 0.4 mg by mouth at bedtime.  . Tiotropium Bromide Monohydrate (SPIRIVA RESPIMAT) 2.5 MCG/ACT AERS Inhale 2 puffs into the lungs daily.  . vitamin B-12 (CYANOCOBALAMIN) 1000 MCG tablet Take 1,000 mcg by mouth at bedtime.   No current facility-administered medications for this visit. (Other)      REVIEW OF SYSTEMS:    ALLERGIES Allergies  Allergen Reactions  . Actos [Pioglitazone] Swelling  . Spironolactone Itching  . Metformin And Related Nausea Only    3.16.2022 pt is currently taking this medication at home  . Niaspan [Niacin Er] Itching and Rash  . Wound Dressing Adhesive Rash    PAST MEDICAL HISTORY Past Medical History:  Diagnosis Date  . Aortic stenosis, mild 11/14/2013  . Aortic valve sclerosis 03/29/2015  . Bilateral leg edema 05/21/2014  . CAD (coronary artery disease)   . Chronic diastolic congestive heart failure ([place])   . Chronic kidney disease (CKD), stage III (moderate) (HCC)   . Diabetes 1.5, managed as type 1 ([place]) 02/04/2013  . Diabetic peripheral neuropathy associated with type 1 diabetes mellitus ([place]) 11/14/2013  . Dysphagia   . Dyspnea on exertion 03/21/2015  . Exogenous obesity   . Gout   . Heart attack ([place])   . History of nuclear stress test 07/2011   dipyridamole; fixed inferolateral defect, worse at stress than rest; no reversible ischemia; low risk scan   . Hyperlipidemia   . Hypertension   . Hypothyroidism   . Insulin dependent diabetes mellitus   . Left foot drop   . Left main coronary artery disease 03/28/2015  . lung ca dx'd 08/2020  . Memory loss   . Obesity (BMI 30-39.9) 11/14/2013  . Obstructive sleep apnea 03/21/2015  . OSA on CPAP    uses a cpap  . Peptic ulcer with hemorrhage 03/28/2015  . Peripheral neuropathy   . Pneumonia   . Rhabdomyolysis   . S/P CABG x 2 03/29/2015   LIMA to Diagonal, SVG to OM, EVH via right  thigh  . Stroke ([place])    L patietal with small scattered lacunar infarcts  . Thrombocytopenia ([place]) 03/21/2015  . Venous insufficiency   . Weakness generalized 03/21/2015   Past Surgical History:  Procedure Laterality Date  . BACK SURGERY  2002   lumbosacral. 11 back surgeries total  . BRONCHIAL BIOPSY  08/06/2020   Procedure: BRONCHIAL BIOPSIES;  Surgeon: Collene Gobble, MD;  Location: [facility] ENDOSCOPY;  Service: Pulmonary;;  . BRONCHIAL BRUSHINGS  08/06/2020   Procedure: BRONCHIAL BRUSHINGS;  Surgeon: Collene Gobble, MD;  Location: [facility] ENDOSCOPY;  Service: Pulmonary;;  .  BRONCHIAL NEEDLE ASPIRATION BIOPSY  08/06/2020   Procedure: BRONCHIAL NEEDLE ASPIRATION BIOPSIES;  Surgeon: Collene Gobble, MD;  Location: [facility] ENDOSCOPY;  Service: Pulmonary;;  . CARDIOVERSION N/A 05/06/2021   Procedure: CARDIOVERSION;  Surgeon: Geralynn Rile, MD;  Location: [place];  Service: Cardiovascular;  Laterality: N/A;  . Carotid Doppler  03/2013   bilat bulb/prox ICAs - mild amount of fibrous plaque with no evidence of diameter reduction  . CARPAL TUNNEL RELEASE Bilateral 08/09/2014   Procedure: BILATERAL CARPAL TUNNEL RELEASE;  Surgeon: Daryll Brod, MD;  Location: [place];  Service: Orthopedics;  Laterality: Bilateral;  ANESTHESIA:  IV REGIONAL BIL FAB  . CHOLECYSTECTOMY    . COLONOSCOPY    . CORONARY ANGIOPLASTY  10/13/1996  . CORONARY ANGIOPLASTY  09/21/1989   emergency PTCA  . CORONARY ANGIOPLASTY  10/13/1996   Multi-Link diagonal & OD stenting (Dr. Marella Chimes)  . CORONARY ANGIOPLASTY  12/03/1997   disease of mid DX-1 ~50% & in mid PLA & PDA (distal lesions) (Dr. Marella Chimes)   . CORONARY ANGIOPLASTY  10/14/1999   progression of disease distal PLA & PDA; progression of disease prox RCA - moderate (Dr. Marella Chimes)   . CORONARY ANGIOPLASTY WITH STENT PLACEMENT  04/04/2004   4.0x66m non-DES (thrombectomy via AngioJet) to RCA for high grade stenosis (Dr. RMarella Chimes  . CORONARY  ARTERY BYPASS GRAFT N/A 03/29/2015   Procedure: CORONARY ARTERY BYPASS GRAFTING TIMES TWO USING LEFT INTERNAL MAMMARY ARTERY AND RIGHT LEG GREATER SAPHENOUS VEIN HARVESTED ENDOSCOPICALLY.;  Surgeon: CRexene Alberts MD;  [place]  Service: Open Heart Surgery;  Laterality: N/A;  . ESOPHAGOGASTRODUODENOSCOPY N/A 03/27/2015   Procedure: ESOPHAGOGASTRODUODENOSCOPY (EGD);  Surgeon: MClarene Essex MD;  [facility];  Service: Endoscopy;  Laterality: N/A;  possible dilation  . ESOPHAGOGASTRODUODENOSCOPY N/A 10/05/2020   Procedure: ESOPHAGOGASTRODUODENOSCOPY (EGD);  Surgeon: OArta Silence MD;  [facility];  Service: Endoscopy;  Laterality: N/A;  . FINE NEEDLE ASPIRATION  08/06/2020   Procedure: FINE NEEDLE ASPIRATION (FNA) LINEAR;  Surgeon: BCollene Gobble MD;  [facility];  Service: Pulmonary;;  . IR IMAGING GUIDED PORT INSERTION  10/07/2020  . LEFT HEART CATHETERIZATION WITH CORONARY ANGIOGRAM N/A 03/28/2015   Procedure: LEFT HEART CATHETERIZATION WITH CORONARY ANGIOGRAM;  Surgeon: Peter M JMartinique MD;  [facility];  Service: Cardiovascular;  Laterality: N/A;  . SINUS ENDO W/FUSION    . TEE WITHOUT CARDIOVERSION N/A 05/06/2021   Procedure: TRANSESOPHAGEAL ECHOCARDIOGRAM (TEE);  Surgeon: OGeralynn Rile MD;  [place]  Service: Cardiovascular;  Laterality: N/A;  . TONSILLECTOMY    . TRANSTHORACIC ECHOCARDIOGRAM  08/08/2013   EF 55-60%, mild conc hypertrophy, grade 1 diastolic dysfunction; AV with mild stenosis; LA & RA mildly dilated  . VIDEO BRONCHOSCOPY WITH ENDOBRONCHIAL NAVIGATION N/A 08/06/2020   Procedure: VIDEO BRONCHOSCOPY WITH ENDOBRONCHIAL NAVIGATION;  Surgeon: BCollene Gobble MD;  [facility];  Service: Pulmonary;  Laterality: N/A;  . VIDEO BRONCHOSCOPY WITH ENDOBRONCHIAL ULTRASOUND N/A 08/06/2020   Procedure: VIDEO BRONCHOSCOPY WITH ENDOBRONCHIAL ULTRASOUND;  Surgeon: BCollene Gobble MD;  [facility];   Service: Pulmonary;  Laterality: N/A;    FAMILY HISTORY Family History  Problem Relation Age of Onset  . Heart disease Mother   . Coronary artery disease Father   . Cancer Maternal Grandmother   . Heart Problems Maternal Grandfather   . Diabetes Son        borderline     SOCIAL HISTORY Social History  Tobacco Use  . Smoking status: Former Smoker    Packs/day: 2.00    Years: 20.00    Pack years: 40.00    Types: Cigarettes    Quit date: 01/25/1974    Years since quitting: 47.3  . Smokeless tobacco: Never Used  Vaping Use  . Vaping Use: Never used  Substance Use Topics  . Alcohol use: No    Alcohol/week: 0.0 standard drinks  . Drug use: No         OPHTHALMIC EXAM:  Base Eye Exam    Visual Acuity (ETDRS)      Right Left   Dist [place] 20/70 +2 20/40 +1   Dist ph [place] NI NI       Tonometry (Tonopen, 9:34 AM)      Right Left   Pressure 16 16       Pupils      Pupils Dark Light Shape React APD   Right PERRL 3 3 Round Minimal None   Left PERRL 3 3 Round Minimal None       Visual Fields (Counting fingers)      Left Right    Full Full       Extraocular Movement      Right Left    Full Full       Neuro/Psych    Oriented x3: Yes   Mood/Affect: Normal       Dilation    Right eye: 1.0% Mydriacyl, 2.5% Phenylephrine @ 9:34 AM        Slit Lamp and Fundus Exam    External Exam      Right Left   External Normal Normal       Slit Lamp Exam      Right Left   Lids/Lashes Normal Normal   Conjunctiva/Sclera White and quiet White and quiet   Cornea Clear Clear   Anterior Chamber Deep and quiet Deep and quiet   Iris Round and reactive Round and reactive   Lens Centered posterior chamber intraocular lens Centered posterior chamber intraocular lens   Anterior Vitreous Normal Normal       Fundus Exam      Right Left   Posterior Vitreous Clear, avitric    Disc Normal    C/D Ratio 0.5    Macula No topographic distortion    Vessels Normal, no DR     Periphery Normal           IMAGING AND PROCEDURES  Imaging and Procedures for 05/21/21  OCT, Retina - OU - Both Eyes       Right Eye Quality was good. Scan locations included subfoveal. Central Foveal Thickness: 355. Progression has been stable. Findings include cystoid macular edema.   Left Eye Quality was good. Scan locations included subfoveal. Central Foveal Thickness: 324. Progression has been stable. Findings include epiretinal membrane.                 ASSESSMENT/PLAN:  Obstructive sleep apnea Patient has sleep apnea yet does not use CPAP uses nightly oxygen instead because he is not able to tolerate the mask at night.  He also reports multiple bathroom trips at night which she attributes to Lasix use.  I discussed the importance of nightly oxygenation of the macula for healing and stabilization of the blood supply to the macula and stressed the importance of maximal compliance with CPAP if it is possible.  I do believe also avitric it would decrease his number of trips to the bathroom because  he wakes up needing to pee because he is struggling to get air and oxygen.  Macular pucker, left eye The nature of macular pucker (epiretinal membrane ERM) was discussed with the patient as well as threshold criteria for vitrectomy surgery. I explained that in rare cases another surgery is needed to actually remove a second wrinkle should it regrow.  Most often, the epiretinal membrane and underlying wrinkled internal limiting membrane are removed with the first surgery, to accomplish the goals.   If the operative eye is Phakic (natural lens still present), cataract surgery is often recommended prior to Vitrectomy. This will enable the retina surgeon to have the best view during surgery and the patient to obtain optimal results in the future. Treatment options were discussed.  Macular pucker, right eye Initial improvement in macular topography now worsened with chronic cystoid  macular edema which I attribute to the fact the patient is not able to currently remain compliant with CPAP use subject gating the macula to nightly hypoxic stress complicated by episodic hypertensive changes leading to CME which is persistent despite topical NSAIDs because its not an inflammatory insult to the macula but instead a hypoxic insult  Cystoid macular degeneration of right eye Chronic and recurrent likely on the basis of hypoxic damage from poor compliance with CPAP use.  Patient is using NSAID likely 3 or 4 times daily with stability of findings but no improvement.  I have encouraged maximal CPAP compliance if possible      ICD-10-CM   1. Cystoid macular degeneration of right eye  H35.351 OCT, Retina - OU - Both Eyes  2. Obstructive sleep apnea  G47.33   3. Macular pucker, left eye  H35.372   4. Macular pucker, right eye  H35.371     1.  I have instructed patient to continue on topical NSAIDs 3-4 times daily right eye to try to minimize CME progression  2.  Encourage improved compliance with CPAP use if at all possible.  3.  Ophthalmic Meds Ordered this visit:  No orders of the defined types were placed in this encounter.      Return in about 8 weeks (around 07/16/2021) for dilate, OD, OCT.  There are no Patient Instructions on file for this visit.   Explained the diagnoses, plan, and follow up with the patient and they expressed understanding.  Patient expressed understanding of the importance of proper follow up care.   Clent Demark Alaira Level M.D. Diseases & Surgery of the Retina and Vitreous Retina & Diabetic [place] 05/21/21     Abbreviations: M myopia (nearsighted); A astigmatism; H hyperopia (farsighted); P presbyopia; Mrx spectacle prescription;  CTL contact lenses; OD right eye; OS left eye; OU both eyes  XT exotropia; ET esotropia; PEK punctate epithelial keratitis; PEE punctate epithelial erosions; DES dry eye syndrome; MGD meibomian gland dysfunction; ATs  artificial tears; PFAT's preservative free artificial tears; [place] nuclear sclerotic cataract; PSC posterior subcapsular cataract; ERM epi-retinal membrane; PVD posterior vitreous detachment; RD retinal detachment; DM diabetes mellitus; DR diabetic retinopathy; NPDR non-proliferative diabetic retinopathy; PDR proliferative diabetic retinopathy; CSME clinically significant macular edema; DME diabetic macular edema; dbh dot blot hemorrhages; CWS cotton wool spot; POAG primary open angle glaucoma; C/D cup-to-disc ratio; HVF humphrey visual field; GVF goldmann visual field; OCT optical coherence tomography; IOP intraocular pressure; BRVO Branch retinal vein occlusion; CRVO central retinal vein occlusion; CRAO central retinal artery occlusion; BRAO branch retinal artery occlusion; RT retinal tear; SB scleral buckle; PPV pars plana vitrectomy; VH Vitreous hemorrhage;  PRP panretinal laser photocoagulation; IVK intravitreal kenalog; VMT vitreomacular traction; MH Macular hole;  NVD neovascularization of the disc; NVE neovascularization elsewhere; AREDS age related eye disease study; ARMD age related macular degeneration; POAG primary open angle glaucoma; EBMD epithelial/anterior basement membrane dystrophy; ACIOL anterior chamber intraocular lens; IOL intraocular lens; PCIOL posterior chamber intraocular lens; Phaco/IOL phacoemulsification with intraocular lens placement; [place] photorefractive keratectomy; LASIK laser assisted in situ keratomileusis; HTN hypertension; DM diabetes mellitus; COPD chronic obstructive pulmonary disease

## 2021-05-21 NOTE — Assessment & Plan Note (Addendum)
Patient has sleep apnea yet does not use CPAP uses nightly oxygen instead because he is not able to tolerate the mask at night.  He also reports multiple bathroom trips at night which she attributes to Lasix use.  I discussed the importance of nightly oxygenation of the macula for healing and stabilization of the blood supply to the macula and stressed the importance of maximal compliance with CPAP if it is possible.  I do believe also avitric it would decrease his number of trips to the bathroom because he wakes up needing to pee because he is struggling to get air and oxygen.

## 2021-05-21 NOTE — Assessment & Plan Note (Signed)
Initial improvement in macular topography now worsened with chronic cystoid macular edema which I attribute to the fact the patient is not able to currently remain compliant with CPAP use subject gating the macula to nightly hypoxic stress complicated by episodic hypertensive changes leading to CME which is persistent despite topical NSAIDs because its not an inflammatory insult to the macula but instead a hypoxic insult

## 2021-05-21 NOTE — Assessment & Plan Note (Signed)
The nature of macular pucker (epiretinal membrane ERM) was discussed with the patient as well as threshold criteria for vitrectomy surgery. I explained that in rare cases another surgery is needed to actually remove a second wrinkle should it regrow.  Most often, the epiretinal membrane and underlying wrinkled internal limiting membrane are removed with the first surgery, to accomplish the goals.   If the operative eye is Phakic (natural lens still present), cataract surgery is often recommended prior to Vitrectomy. This will enable the retina surgeon to have the best view during surgery and the patient to obtain optimal results in the future. Treatment options were discussed. 

## 2021-05-26 ENCOUNTER — Other Ambulatory Visit: Payer: Self-pay | Admitting: Endocrinology

## 2021-05-27 ENCOUNTER — Ambulatory Visit (INDEPENDENT_AMBULATORY_CARE_PROVIDER_SITE_OTHER): Payer: Medicare Other | Admitting: Rheumatology

## 2021-05-27 ENCOUNTER — Other Ambulatory Visit: Payer: Self-pay

## 2021-05-27 ENCOUNTER — Encounter: Payer: Self-pay | Admitting: Rheumatology

## 2021-05-27 VITALS — BP 110/53 | HR 50 | Resp 18 | Ht 67.0 in | Wt 220.0 lb

## 2021-05-27 DIAGNOSIS — G629 Polyneuropathy, unspecified: Secondary | ICD-10-CM

## 2021-05-27 DIAGNOSIS — K274 Chronic or unspecified peptic ulcer, site unspecified, with hemorrhage: Secondary | ICD-10-CM

## 2021-05-27 DIAGNOSIS — M1A09X0 Idiopathic chronic gout, multiple sites, without tophus (tophi): Secondary | ICD-10-CM

## 2021-05-27 DIAGNOSIS — I214 Non-ST elevation (NSTEMI) myocardial infarction: Secondary | ICD-10-CM

## 2021-05-27 DIAGNOSIS — Z79899 Other long term (current) drug therapy: Secondary | ICD-10-CM

## 2021-05-27 DIAGNOSIS — N183 Chronic kidney disease, stage 3 unspecified: Secondary | ICD-10-CM

## 2021-05-27 DIAGNOSIS — E1042 Type 1 diabetes mellitus with diabetic polyneuropathy: Secondary | ICD-10-CM

## 2021-05-27 DIAGNOSIS — I358 Other nonrheumatic aortic valve disorders: Secondary | ICD-10-CM

## 2021-05-27 DIAGNOSIS — I493 Ventricular premature depolarization: Secondary | ICD-10-CM

## 2021-05-27 DIAGNOSIS — M5030 Other cervical disc degeneration, unspecified cervical region: Secondary | ICD-10-CM

## 2021-05-27 DIAGNOSIS — R413 Other amnesia: Secondary | ICD-10-CM

## 2021-05-27 DIAGNOSIS — C3491 Malignant neoplasm of unspecified part of right bronchus or lung: Secondary | ICD-10-CM

## 2021-05-27 DIAGNOSIS — M5136 Other intervertebral disc degeneration, lumbar region: Secondary | ICD-10-CM

## 2021-05-27 DIAGNOSIS — I5032 Chronic diastolic (congestive) heart failure: Secondary | ICD-10-CM

## 2021-05-27 DIAGNOSIS — I10 Essential (primary) hypertension: Secondary | ICD-10-CM

## 2021-05-27 DIAGNOSIS — M1711 Unilateral primary osteoarthritis, right knee: Secondary | ICD-10-CM

## 2021-05-27 DIAGNOSIS — D696 Thrombocytopenia, unspecified: Secondary | ICD-10-CM

## 2021-05-27 DIAGNOSIS — I2510 Atherosclerotic heart disease of native coronary artery without angina pectoris: Secondary | ICD-10-CM

## 2021-05-27 DIAGNOSIS — G4733 Obstructive sleep apnea (adult) (pediatric): Secondary | ICD-10-CM

## 2021-05-28 LAB — URIC ACID: Uric Acid, Serum: 3.6 mg/dL — ABNORMAL LOW (ref 4.0–8.0)

## 2021-05-28 NOTE — Progress Notes (Signed)
Uric acid is below 6 and in desirable range.

## 2021-05-29 ENCOUNTER — Encounter: Payer: Self-pay | Admitting: Internal Medicine

## 2021-05-29 ENCOUNTER — Ambulatory Visit (INDEPENDENT_AMBULATORY_CARE_PROVIDER_SITE_OTHER): Payer: Medicare Other | Admitting: Internal Medicine

## 2021-05-29 ENCOUNTER — Other Ambulatory Visit: Payer: Self-pay

## 2021-05-29 VITALS — BP 128/54 | HR 65 | Ht 67.0 in | Wt 225.6 lb

## 2021-05-29 DIAGNOSIS — Z79899 Other long term (current) drug therapy: Secondary | ICD-10-CM

## 2021-05-29 DIAGNOSIS — I5043 Acute on chronic combined systolic (congestive) and diastolic (congestive) heart failure: Secondary | ICD-10-CM

## 2021-05-29 DIAGNOSIS — Z951 Presence of aortocoronary bypass graft: Secondary | ICD-10-CM | POA: Diagnosis not present

## 2021-05-29 DIAGNOSIS — R0602 Shortness of breath: Secondary | ICD-10-CM | POA: Diagnosis not present

## 2021-05-29 MED ORDER — METOLAZONE 2.5 MG PO TABS: 2.5000 mg | ORAL_TABLET | ORAL | 5 refills | Status: DC

## 2021-05-29 NOTE — Progress Notes (Signed)
OFFICE NOTE  Chief Complaint:  Follow-up hospitalization  Primary Care Physician: [name], MD  HPI:  Randall Wilkins is a pleasant 80 year old male patient who was formerly followed by Dr. Rollene Fare. He is here to establish cardiac care with [place] today. He has a history of obesity, multiple comorbidities including insulin-dependent diabetes, diabetic peripheral neuropathy, diabetic nephropathy, venous insufficiency and peripheral neuropathy. He also has obstructive sleep apnea on CPAP and coronary artery disease. He unfortunately suffered a left parietal stroke with small lacunar infarcts noted on MRI in 2013. He is a left foot drop from an L4/L5 neuropathy and wears a brace for this. From a cardiac standpoint he had a large non-drug-eluting stent placed in the RCA in 2005. He has had a stent placed at 89 to a diagonal which was noted to be patent. He's had some problems with dizziness which is improved. He [place] as large to medium secondary to neuropathy and venous insufficiency. Unfortunately not been able to lose a lot of weight due to his difficulty in ambulating and painful neuropathy.  He denies any cardiac chest pain or worsening shortness of breath.  Randall Wilkins returns today for a six-month followup. He is having no complaints. He does report some lower extremity swelling which is stable. He has mild aortic stenosis which is been stable as well. His last echo was in August 2014. He denies any chest pain or worsening shortness of breath. He recently had lab work to Dr. Eugenio Hoes office which we will obtain.  I saw Randall Wilkins back today for hospital follow-up. He is reportedly doing much better. He has more energy and no significant chest discomfort. Between our last appointment see presented with a urinary tract infection and ultimately had chest pain and profuse diaphoresis. He was found to have elevated troponin and underwent cardiac catheterization. This demonstrated the  following:  Coronary angiography: Coronary dominance: right  Left mainstem: 95% distal left main stenosis.  Left anterior descending (LAD): There is 95% ostial LAD stenosis. The LAD is a large vessel. The LAD and diagonal otherwise are without significant disease.  There is a large ramus intermediate branch with 99% ostial stenosis.  Left circumflex (LCx): 100% ostial occlusion with faint left to left collaterals.  Right coronary artery (RCA): the RCA is a large dominant branch. The stent in the proximal vessel is widely patent. There is a 60% stenosis in the mid PDA. The third PLOM is moderate in size with a long 90-95% stenosis prior to distal arborization.    Subsequently he underwent emergent coronary artery bypass grafting with a LIMA to LAD and SVG to OM branch. He is noted to have mild aortic sclerosis versus stenosis, however the valve was not severe enough to be addressed. Since discharge he is more active and is starting cardiac rehabilitation today.  Randall Wilkins returns today for follow-up. Overall he is feeling well and continues to improve in his ambulation and stamina. He denies any chest pain or shortness of breath. He is rehabilitating well. Recent laboratory work shows excellent cholesterol control and total cholesterol 130, HDL 42 LDL 61 and triglycerides 137. His only concern is the cost of medications.  I saw Randall Wilkins back today in the office. He denies any chest pain or worsening shortness of breath. Unfortunately he's had about several weeks of diarrhea which started when he went to the [facility] area. It was not felt to be infectious any seeing a gastroenterologist about this. Otherwise he is without  cardiac complaints. Blood pressure today was low at 114/52. He's managed to lose only a couple of pounds but generally his appetite stools been pretty good despite the diarrhea. He is planning on having an upcoming injection and I received a request to hold his Plavix for 5  days prior to that. He is currently holding it will have his injection on Monday. Based on this I reviewed his records and I do not see a clear ongoing indication for the Plavix in addition to aspirin. Now that he's been surgically revascularized.   03/04/2017  Randall Wilkins returns today for follow-up. Recently he's had some dizziness and apparently had lower blood pressure. He backed off on his lisinopril to 2.5 mg daily, or at least he says he is taking a half tablet. Will try to figure out what the dose was. EKG today shows sinus bradycardia with either a first degree AV block of great significance or possibly atrial fibrillation. It's difficult to see consistent P waves with normal PP intervals. The RR intervals are slightly irregular. He has had sinus with a long first-degree AV block of 340 ms in the past. This is important to determine as he has had prior stroke and would necessitate anticoagulation.  03/23/2018  Randall Wilkins returns today for follow-up.  He has no specific complaints.  He continues to have a little instability with his gait.  He is using a cane today but has a walker at home.  Recently he has had the best blood sugars that he has had in a while.  He started seeing Dr. Dwyane Dee, who added Victoza.  He is also on [place] and Humalog.  He was in a clinical trial with Jardiance, and is to be considered given his history of coronary disease and diabetes for mortality benefit.  I will defer to Dr. Dwyane Dee.  Blood pressure is well controlled today.  Weight remains excessive.  EKG personally reviewed shows sinus rhythm with PVCs for which he is unaware of.  02/07/2019  Randall Wilkins returns today for follow-up.  He is done well and has no new complaints.  Recently seen by Dr. Claiborne Billings and adjustments were made to his CPAP.  He is reportedly 100% compliant.  He was noted to have a type I winky block on EKG a couple weeks ago and his Toprol-XL was reduced from 37.5 to 25 mg.  He seems to be tolerating this  however blood pressures accordingly gone up.  Blood pressure today was 155/71 and home blood pressures are in the 150s.  03/13/2020  Randall Wilkins is seen today in follow-up.  He is accompanied by his wife.  He scheduled to see Dr. Claiborne Billings in a couple weeks.  Recently he has been struggling with shortness of breath and fatigue.  He saw his primary care provider and diagnosed with a pneumonia.  He has been on antibiotics and does seem to be improving.  He has somewhat of a productive cough.  An EKG was performed today and shows atrial fibrillation controlled ventricular response.  This is newly documented.  He does not have known history of this although had had a prior stroke.  He is on low-dose aspirin.  05/03/2020  Randall Wilkins is seen today in follow-up.  He [place] got [place] from the beach.  He remains in A. fib.  Fortunately his pneumonia is not resolving.  Repeat imaging still suggest multifocal infiltrates and his PCP apparently is going to refer him to pulmonary.  I suspect he may end up getting bronchoscopy  or other procedures.  This will complicate things if he were to have a cardioversion, particularly I would not want to stop his anticoagulation to allow for those procedures within a month after it.  At this point he remains in A. fib today but is rate controlled.  He has been fatigued again I think this is related his A. fib and his pneumonias.  He is essentially nonambulatory and difficult for his wife to push around.  They have asked about a powered wheelchair.  07/08/2020  Randall Wilkins returns today for follow-up.  He appears to be an sinus rhythm with Mobitz 1 AV block.  He had this previously and has had some intermittent A. fib however there is no indication for cardioversion.  He is anticoagulated on Eliquis.  His breathing is worsened and recently had a super D CT scan of the chest ordered by Dr. Lamonte Sakai.  Unfortunate this shows a large 5 cm cavitary lung mass in the right lower lobe as well as some persistent lung  mass in the left upper lobe.  Is not clear to me whether this is tumor versus a possible pneumonic process.  He also reported that they had had some remodeling done in her house and they found extensive amounts of mold in the kitchen.  02/06/2021  Randall Wilkins is seen today in the office for follow-up.  He has had some progressive dyspnea.  EKG today shows what appears to be a long first-degree AV block, probably sinus rhythm with a Mobitz 1.  Looking back he had an EKG in October which is read as A. fib but appears to be regular with a long first-degree AV block.  His initial diagnosis of A. fib I think came from me earlier last year with an EKG in the office that was questionable for A. fib but also probably showed a long first-degree AV block.  Nevertheless he remains on Eliquis 2.5 mg twice daily.  His dyspnea has progressed somewhat.  He saw pulmonary and was started on Spiriva which may have improved a little.  He also has extensive lung cancer which may be causing his dyspnea.  He has reported a little bit of leg swelling.  Echo last year showed an EF of 48% with mild to moderate aortic stenosis.  03/11/2021  Randall Wilkins is seen today for a cardiac clearance.  He is supposed to undergo periodontal surgery because apparently he has a cavity or abscess which may affect his bridge.  Apparently this is more urgent.  He would have to stop his Eliquis 2 days prior to the procedure and should start afterwards.  According to our pharmacist it was noted that his creatinine has improved slightly and is now less than 1.5 therefore he will qualify for the 48m twice daily dose.  He does note some improvement in shortness of breath which may have been due to some changes in his chemotherapy.  EKG today shows an atrial flutter at 73.  05/29/2021  LDaymeinreturns today for hospitalization follow-up.  I was fortunate to be rounding in the hospital and took care of him while he was there.  He presented with decompensated heart  failure and significant weight gain.  He was diuresed under 100 kg and had marked improvement in his symptoms.  He was also noted to be in atrial flutter.  I did pursue elective cardioversion however he converted to what appeared to be atrial fibrillation.  He seemed to maintain this for a while however now today he is  back in atrial flutter again with controlled ventricular response.  According to his wife his weight has been going up.  He has become more short of breath requiring oxygen.  He cannot wear his CPAP because he feels smothered.  Weight is down 102 kg, up from 98 kg at discharge.  PMHx:  Past Medical History:  Diagnosis Date   Aortic stenosis, mild 11/14/2013   Aortic valve sclerosis 03/29/2015   Bilateral leg edema 05/21/2014   CAD (coronary artery disease)    Chronic diastolic congestive heart failure (HCC)    Chronic kidney disease (CKD), stage III (moderate) (HCC)    Diabetes 1.5, managed as type 1 ([place]) 02/04/2013   Diabetic peripheral neuropathy associated with type 1 diabetes mellitus ([place]) 11/14/2013   Dysphagia    Dyspnea on exertion 03/21/2015   Exogenous obesity    Gout    Heart attack ([place])    History of nuclear stress test 07/2011   dipyridamole; fixed inferolateral defect, worse at stress than rest; no reversible ischemia; low risk scan    Hyperlipidemia    Hypertension    Hypothyroidism    Insulin dependent diabetes mellitus    Left foot drop    Left main coronary artery disease 03/28/2015   lung ca dx'd 08/2020   Memory loss    Obesity (BMI 30-39.9) 11/14/2013   Obstructive sleep apnea 03/21/2015   OSA on CPAP    uses a cpap   Peptic ulcer with hemorrhage 03/28/2015   Peripheral neuropathy    Pneumonia    Rhabdomyolysis    S/P CABG x 2 03/29/2015   LIMA to Diagonal, SVG to OM, EVH via right thigh   Stroke ([place])    L patietal with small scattered lacunar infarcts   Thrombocytopenia ([place]) 03/21/2015   Venous insufficiency    Weakness generalized 03/21/2015    Past  Surgical History:  Procedure Laterality Date   BACK SURGERY  2002   lumbosacral. 11 back surgeries total   BRONCHIAL BIOPSY  08/06/2020   Procedure: BRONCHIAL BIOPSIES;  Surgeon: Collene Gobble, MD;  Location: [facility] ENDOSCOPY;  Service: Pulmonary;;   BRONCHIAL BRUSHINGS  08/06/2020   Procedure: BRONCHIAL BRUSHINGS;  Surgeon: Collene Gobble, MD;  Location: [facility] ENDOSCOPY;  Service: Pulmonary;;   BRONCHIAL NEEDLE ASPIRATION BIOPSY  08/06/2020   Procedure: BRONCHIAL NEEDLE ASPIRATION BIOPSIES;  Surgeon: Collene Gobble, MD;  Location: [place] ENDOSCOPY;  Service: Pulmonary;;   CARDIOVERSION N/A 05/06/2021   Procedure: CARDIOVERSION;  Surgeon: Geralynn Rile, MD;  Location: [place];  Service: Cardiovascular;  Laterality: N/A;   Carotid Doppler  03/2013   bilat bulb/prox ICAs - mild amount of fibrous plaque with no evidence of diameter reduction   CARPAL TUNNEL RELEASE Bilateral 08/09/2014   Procedure: BILATERAL CARPAL TUNNEL RELEASE;  Surgeon: Daryll Brod, MD;  Location: [place];  Service: Orthopedics;  Laterality: Bilateral;  ANESTHESIA:  IV REGIONAL BIL FAB   CHOLECYSTECTOMY     COLONOSCOPY     CORONARY ANGIOPLASTY  10/13/1996   CORONARY ANGIOPLASTY  09/21/1989   emergency PTCA   CORONARY ANGIOPLASTY  10/13/1996   Multi-Link diagonal & OD stenting (Dr. Marella Chimes)   CORONARY ANGIOPLASTY  12/03/1997   disease of mid DX-1 ~50% & in mid PLA & PDA (distal lesions) (Dr. Marella Chimes)    CORONARY ANGIOPLASTY  10/14/1999   progression of disease distal PLA & PDA; progression of disease prox RCA - moderate (Dr. Marella Chimes)    [place]  PLACEMENT  04/04/2004   4.0x59mm non-DES (thrombectomy via AngioJet) to RCA for high grade stenosis (Dr. Marella Chimes)   CORONARY ARTERY BYPASS GRAFT N/A 03/29/2015   Procedure: CORONARY ARTERY BYPASS GRAFTING TIMES TWO USING LEFT INTERNAL MAMMARY ARTERY AND RIGHT LEG GREATER SAPHENOUS VEIN HARVESTED ENDOSCOPICALLY.;   Surgeon: Rexene Alberts, MD;  Location: [place];  Service: Open Heart Surgery;  Laterality: N/A;   ESOPHAGOGASTRODUODENOSCOPY N/A 03/27/2015   Procedure: ESOPHAGOGASTRODUODENOSCOPY (EGD);  Surgeon: Clarene Essex, MD;  Location: [facility] ENDOSCOPY;  Service: Endoscopy;  Laterality: N/A;  possible dilation   ESOPHAGOGASTRODUODENOSCOPY N/A 10/05/2020   Procedure: ESOPHAGOGASTRODUODENOSCOPY (EGD);  Surgeon: Arta Silence, MD;  Location: [facility] ENDOS[facility];  Service: Endoscopy;  Laterality: N/A;   EYE SURGERY Right    macular fold-02/2021   FINE NEEDLE ASPIRATION  08/06/2020   Procedure: FINE NEEDLE ASPIRATION (FNA) LINEAR;  Surgeon: Collene Gobble, MD;  Location: [place] ENDOSCOPY;  Service: Pulmonary;;   IR IMAGING GUIDED PORT INSERTION  10/07/2020   LEFT HEART CATHETERIZATION WITH CORONARY ANGIOGRAM N/A 03/28/2015   Procedure: LEFT HEART CATHETERIZATION WITH CORONARY ANGIOGRAM;  Surgeon: Peter M Martinique, MD;  Location[facility] CATH LAB;  Service: Cardiovascular;  Laterality: N/A;   SINUS ENDO W/FUSION     TEE WITHOUT CARDIOVERSION N/A 05/06/2021   Procedure: TRANSESOPHAGEAL ECHOCARDIOGRAM (TEE);  Surgeon: Geralynn Rile, MD;  Location: [place];  Service: Cardiovascular;  Laterality: N/A;   TONSILLECTOMY     TRANSTHORACIC ECHOCARDIOGRAM  08/08/2013   EF 55-60%, mild conc hypertrophy, grade 1 diastolic dysfunction; AV with mild stenosis; LA & RA mildly dilated   VIDEO BRONCHOSCOPY WITH ENDOBRONCHIAL NAVIGATION N/A 08/06/2020   Procedure: VIDEO BRONCHOSCOPY WITH ENDOBRONCHIAL NAVIGATION;  Surgeon: Collene Gobble, MD;  Location: [place] ENDOSCOPY;  Service: Pulmonary;  Laterality: N/A;   VIDEO BRONCHOSCOPY WITH ENDOBRONCHIAL ULTRASOUND N/A 08/06/2020   Procedure: VIDEO BRONCHOSCOPY WITH ENDOBRONCHIAL ULTRASOUND;  Surgeon: Collene Gobble, MD;  Location: [place] ENDOSCOPY;  Service: Pulmonary;  Laterality: N/A;    FAMHx:  Family History  Problem Relation Age of Onset   Heart disease Mother    Coronary artery disease  Father    Cancer Maternal Grandmother    Heart Problems Maternal Grandfather    Diabetes Son        borderline     SOCHx:   reports that he quit smoking about 47 years ago. His smoking use included cigarettes. He has a 40.00 pack-year smoking history. He has never used smokeless tobacco. He reports that he does not drink alcohol and does not use drugs.  ALLERGIES:  Allergies  Allergen Reactions   Actos [Pioglitazone] Swelling   Spironolactone Itching   Metformin And Related Nausea Only    3.16.2022 pt is currently taking this medication at home   Niaspan [Niacin Er] Itching and Rash   Wound Dressing Adhesive Rash    ROS: Pertinent items noted in HPI and remainder of comprehensive ROS otherwise negative.  HOME MEDS: Current Outpatient Medications  Medication Sig Dispense Refill   allopurinol (ZYLOPRIM) 100 MG tablet TAKE ONE TABLET EVERY DAY 30 tablet 6   amLODipine (NORVASC) 10 MG tablet TAKE ONE TABLET BY MOUTH ONCE DAILY 90 tablet 3   apixaban (ELIQUIS) 5 MG TABS tablet Take 1 tablet (5 mg total) by mouth 2 (two) times daily. 60 tablet 11   Blood Glucose Monitoring Suppl (CONTOUR NEXT MONITOR) w/Device KIT 1 Device by Does not apply route 3 (three) times daily. Use to check blood sugars 3 times daily. Dx Code E13.9  1 kit 2   colchicine 0.6 MG tablet Take 0.6 mg by mouth 3 (three) times daily as needed.     CONTOUR NEXT TEST test strip USE 1 STRIP TO CHECK GLUCOSE 4 TIMES DAILY 300 each 1   furosemide (LASIX) 20 MG tablet Take 2 tablets (40 mg total) by mouth 2 (two) times daily. 450 tablet 2   hydrALAZINE (APRESOLINE) 25 MG tablet Take 1.5 tablets (37.5 mg total) by mouth in the morning and at bedtime. 270 tablet 3   insulin degludec (TRESIBA FLEXTOUCH) 200 UNIT/ML FlexTouch Pen Inject 12 Units into the skin at bedtime.     insulin lispro (HUMALOG) 100 UNIT/ML cartridge Inject 5-15 Units into the skin 3 (three) times daily with meals. Sliding scale.     Insulin Pen Needle (EASY  COMFORT PEN NEEDLES) 33G X 4 MM MISC 1 each by Does not apply route See admin instructions. Use to inject insulin 5 times daily. 150 each 3   isosorbide dinitrate (ISORDIL) 20 MG tablet Take 1 tablet (20 mg total) by mouth in the morning and at bedtime. 180 tablet 3   levothyroxine (SYNTHROID, LEVOTHROID) 75 MCG tablet Take 75 mcg by mouth daily before breakfast.   1   lidocaine-prilocaine (EMLA) cream APPLY A TEASPOON OVER PORT SITE AT LEASTONE HOUR BEFORE LAB APPOINTMENT. DO NOT RUB IN AND COVER WITH PLASTIC WRAP (Patient taking differently: Apply 1 application topically See admin instructions. Apply a teaspoon over port site at least one hour before lab appointment. Do not rub in and cover with plastic wrap.) 30 g 0   lisinopril (ZESTRIL) 10 MG tablet Take 1 tablet (10 mg total) by mouth daily. 90 tablet 3   metFORMIN (GLUCOPHAGE-XR) 750 MG 24 hr tablet TAKE 1 TABLET WITH DINNER 90 tablet 1   metolazone (ZAROXOLYN) 2.5 MG tablet Take 1 tablet (2.5 mg total) by mouth as directed. Take 30-60 minutes in the morning before furosemide. Take on Monday/Thursday 10 tablet 5   ondansetron (ZOFRAN) 4 MG tablet Take 1 tablet (4 mg total) by mouth every 6 (six) hours as needed for nausea. 20 tablet 0   OXYGEN Inhale 2 L into the lungs. At bedtime and during the day prn     potassium chloride SA (K-DUR,KLOR-CON) 20 MEQ tablet Take 20 mEq by mouth 2 (two) times daily.      rosuvastatin (CRESTOR) 20 MG tablet Take 20 mg by mouth daily.     sotorasib 120 MG TABS TAKE 960 MG BY MOUTH EVERY MORNING. (Patient taking differently: Take 960 mg by mouth daily.) 240 tablet 3   Tamsulosin HCl (FLOMAX) 0.4 MG CAPS Take 0.4 mg by mouth at bedtime.     vitamin B-12 (CYANOCOBALAMIN) 1000 MCG tablet Take 1,000 mcg by mouth at bedtime.     prochlorperazine (COMPAZINE) 10 MG tablet Take 1 tablet (10 mg total) by mouth every 6 (six) hours as needed for nausea or vomiting. (Patient not taking: Reported on 05/29/2021) 30 tablet 0    No current facility-administered medications for this visit.    LABS/IMAGING: No results found for this or any previous visit (from the past 48 hour(s)). No results found.  VITALS: BP (!) 128/54 (BP Location: Left Arm, Patient Position: Sitting)   Pulse 65   Ht $R'5\' 7"'ZW$  (1.702 m)   Wt 225 lb 9.6 oz (102.3 kg)   SpO2 93%   BMI 35.33 kg/m   EXAM: General appearance: alert, no distress and in wheelchair Neck: JVD - 3 cm above  sternal notch, no carotid bruit and thyroid not enlarged, symmetric, no tenderness/mass/nodules Lungs: diminished breath sounds bibasilar and rales bibasilar Heart: irregularly irregular rhythm Abdomen: soft, non-tender; bowel sounds normal; no masses,  no organomegaly Extremities: edema 1+ RLE and LLE in a brace Pulses: 2+ and symmetric Skin: Skin color, texture, turgor normal. No rashes or lesions Neurologic: Grossly normal Psych: Pleasant  EKG: Atrial flutter with variable ventricular response at 65, nonspecific ST-T wave changes personally reviewed  ASSESSMENT: Acute on chronic combined systolic and diastolic congestive heart failure, LVEF 45 to 50% (04/2021) Paroxysmal atrial fibrillation/flutter - CHADSVASC score of 6 Lung Cancer Recent multifocal pneumonia Coronary artery disease status post BMS to the RCA in 2005, recent CABG 2 (LIMA to LAD - for critical left main disease, SVG to OM1)- 2016 Obesity Hypertension-controlled Dyslipidemia Prior stroke Diabetic neuropathy Diabetic nephropathy - left foot drop Type 1 diabetes on insulin LE edema Mild aortic stenosis LVEF 48% with mild to moderate AS (03/2020)  PLAN: 1.   Randall Wilkins has had progressive dyspnea and oxygen requirement as well as lower extremity edema and 5 kg weight gain since discharge in May.  He is currently on 40 mg twice daily of Lasix.  Will add metolazone 2.5 mg on Monday and Thursday and monitor closely for response.  He may need more frequent dosing.  Plan to repeat B med  and BNP in a couple weeks and follow-up with me in about a month.  Pixie Casino, MD, [facility], [place] Director of the Advanced Lipid Disorders &  Cardiovascular Risk Reduction Clinic Diplomate of the American Board of Clinical Lipidology Attending Cardiologist  Direct Dial: [phone]  Fax: [phone]  Website:  [email or web address] 05/29/2021, 2:04 PM

## 2021-05-29 NOTE — Patient Instructions (Signed)
Medication Instructions:  START metolazone 2.5mg  -- take 30-60 minutes before AM lasix -- take on Monday & Thursday only   *If you need a refill on your cardiac medications before your next appointment, please call your pharmacy*   Lab Work: BMET & BNP with labs at cancer center   If you have labs (blood work) drawn today and your tests are completely normal, you will receive your results only by: [place] (if you have MyChart) OR A paper copy in the mail If you have any lab test that is abnormal or we need to change your treatment, we will call you to review the results.   Testing/Procedures: NONE   Follow-Up: At [facility], you and your health needs are our priority.  As part of our continuing mission to provide you with exceptional heart care, we have created designated Provider Care Teams.  These Care Teams include your primary Cardiologist (physician) and Advanced Practice Providers (APPs -  Physician Assistants and Nurse Practitioners) who all work together to provide you with the care you need, when you need it.  We recommend signing up for the patient portal called "MyChart".  Sign up information is provided on this After Visit Summary.  MyChart is used to connect with patients for Virtual Visits (Telemedicine).  Patients are able to view lab/test results, encounter notes, upcoming appointments, etc.  Non-urgent messages can be sent to your provider as well.   To learn more about what you can do with MyChart, go to [email or web address].    Your next appointment:   July 01, 2021 @ 2pm with Dr. Debara Pickett

## 2021-06-03 ENCOUNTER — Telehealth: Payer: Self-pay | Admitting: Internal Medicine

## 2021-06-03 NOTE — Telephone Encounter (Signed)
Wife updated with MD's recommendations and verbalized understanding.

## 2021-06-03 NOTE — Telephone Encounter (Signed)
Spoke to pt's wife who report pt woke up this morning around 2 am reporting he felt like he couldn't breath. Wife state after getting him up in the recliner and increasing oxygen from 2 L to 3, pt was able to go back to sleep. She state pt is reporting feeling a little better but does notice more bilateral leg swelling. No recent weights.  Wife report pt just took his first dose of metolazone yesterday and urinating well. She also report PT saw pt this morning and BP was 90/50. They did not proceed with session due to low BP. Nurse had wife repeat reading and has since increased to 113/58. Pt denies lightheadedness or dizziness, but c/o a headache.  Will forward to MD for review.

## 2021-06-03 NOTE — Telephone Encounter (Signed)
Pt c/o BP issue: STAT if pt c/o blurred vision, one-sided weakness or slurred speech  1. What are your last 5 BP readings? 90/50  2. Are you having any other symptoms (ex. Dizziness, headache, blurred vision, passed out)? headache  3. What is your BP issue? Bp is low  Aid that came to the house this morning told her to call his heart dr because his bp was low Patient oxygen level was good.

## 2021-06-03 NOTE — Telephone Encounter (Signed)
Thanks . I would monitor the symptoms - the metolazone should knock out some fluid, but it may be too strong if his BP was low - they should monitor weights daily to look for trends and to make sure he is not over-diuresed  Dr Lemmie Evens

## 2021-06-06 DIAGNOSIS — N183 Chronic kidney disease, stage 3 unspecified: Secondary | ICD-10-CM | POA: Diagnosis not present

## 2021-06-06 DIAGNOSIS — I10 Essential (primary) hypertension: Secondary | ICD-10-CM | POA: Diagnosis not present

## 2021-06-06 DIAGNOSIS — E1129 Type 2 diabetes mellitus with other diabetic kidney complication: Secondary | ICD-10-CM | POA: Diagnosis not present

## 2021-06-06 DIAGNOSIS — E039 Hypothyroidism, unspecified: Secondary | ICD-10-CM | POA: Diagnosis not present

## 2021-06-08 DIAGNOSIS — C3491 Malignant neoplasm of unspecified part of right bronchus or lung: Secondary | ICD-10-CM | POA: Diagnosis not present

## 2021-06-09 ENCOUNTER — Telehealth: Payer: Self-pay | Admitting: Internal Medicine

## 2021-06-09 NOTE — Telephone Encounter (Signed)
Thanks .Marland Kitchen bp looks good, weight stable  Dr Lemmie Evens

## 2021-06-09 NOTE — Telephone Encounter (Signed)
Could be - should be due for a re-check BMET this week? IF not, ok to order  Dr Lemmie Evens

## 2021-06-09 NOTE — Telephone Encounter (Signed)
Wife updated and state she forgot to mention the pt is very tired and nauseous. She was wondering if symptoms could be contributed to the metolazone.

## 2021-06-09 NOTE — Telephone Encounter (Signed)
Patient's wife calling in to get his bp and weight  6/22 224lbs Bp 115/52 6/23 220lbs BP: 104/44  6/24 N/a      BP: 147/58 6/25 222lbs BP: 128/49 6/26 221lbs BP: 129/64 6/27 224lbs BP: 106/73

## 2021-06-10 ENCOUNTER — Other Ambulatory Visit: Payer: Self-pay

## 2021-06-10 DIAGNOSIS — Z79899 Other long term (current) drug therapy: Secondary | ICD-10-CM

## 2021-06-10 NOTE — Telephone Encounter (Signed)
Called patient wife, advised of message from Dr.Hilty.  Patient wife verbalized understanding. BMET ordered. Patient will come this week to have it drawn.

## 2021-06-10 NOTE — Telephone Encounter (Signed)
Pt is returning call from earlier today. Please advise pt further

## 2021-06-10 NOTE — Telephone Encounter (Signed)
Left message to call back  

## 2021-06-11 ENCOUNTER — Telehealth: Payer: Self-pay | Admitting: Medical Oncology

## 2021-06-11 DIAGNOSIS — Z79899 Other long term (current) drug therapy: Secondary | ICD-10-CM | POA: Diagnosis not present

## 2021-06-11 DIAGNOSIS — R0602 Shortness of breath: Secondary | ICD-10-CM | POA: Diagnosis not present

## 2021-06-11 NOTE — Telephone Encounter (Signed)
Amitriptyline and Sotorasib together  Wife asking if okay for him to take Amitriptyline with Sortorasib- PCP told her to ask [place].   Per [employer] drug interactions there are no adverse interactions.I notified his wife of this.

## 2021-06-12 ENCOUNTER — Other Ambulatory Visit: Payer: Self-pay | Admitting: *Deleted

## 2021-06-12 LAB — BASIC METABOLIC PANEL
BUN/Creatinine Ratio: 23 (ref 10–24)
BUN: 43 mg/dL — ABNORMAL HIGH (ref 8–27)
CO2: 19 mmol/L — ABNORMAL LOW (ref 20–29)
Calcium: 9.2 mg/dL (ref 8.6–10.2)
Chloride: 105 mmol/L (ref 96–106)
Creatinine, Ser: 1.89 mg/dL — ABNORMAL HIGH (ref 0.76–1.27)
Glucose: 85 mg/dL (ref 65–99)
Potassium: 4.9 mmol/L (ref 3.5–5.2)
Sodium: 141 mmol/L (ref 134–144)
eGFR: 35 mL/min/{1.73_m2} — ABNORMAL LOW (ref >59)

## 2021-06-12 LAB — BRAIN NATRIURETIC PEPTIDE: BNP: 142.3 pg/mL — ABNORMAL HIGH (ref 0.0–100.0)

## 2021-06-17 ENCOUNTER — Ambulatory Visit (HOSPITAL_COMMUNITY)
Admission: RE | Admit: 2021-06-17 | Discharge: 2021-06-17 | Disposition: A | Discharge status: Home / Self Care | Payer: Medicare Other | Source: Ambulatory Visit | Attending: Internal Medicine | Admitting: Internal Medicine

## 2021-06-17 ENCOUNTER — Inpatient Hospital Stay: Payer: Medicare Other | Attending: Internal Medicine

## 2021-06-17 ENCOUNTER — Inpatient Hospital Stay: Payer: Medicare Other

## 2021-06-17 ENCOUNTER — Other Ambulatory Visit: Payer: Self-pay

## 2021-06-17 DIAGNOSIS — C3412 Malignant neoplasm of upper lobe, left bronchus or lung: Secondary | ICD-10-CM | POA: Insufficient documentation

## 2021-06-17 DIAGNOSIS — Z79899 Other long term (current) drug therapy: Secondary | ICD-10-CM | POA: Diagnosis not present

## 2021-06-17 DIAGNOSIS — Z8673 Personal history of transient ischemic attack (TIA), and cerebral infarction without residual deficits: Secondary | ICD-10-CM | POA: Insufficient documentation

## 2021-06-17 DIAGNOSIS — I700 Atherosclerosis of aorta: Secondary | ICD-10-CM | POA: Diagnosis not present

## 2021-06-17 DIAGNOSIS — Z95828 Presence of other vascular implants and grafts: Secondary | ICD-10-CM

## 2021-06-17 DIAGNOSIS — Z5111 Encounter for antineoplastic chemotherapy: Secondary | ICD-10-CM | POA: Diagnosis not present

## 2021-06-17 DIAGNOSIS — C3491 Malignant neoplasm of unspecified part of right bronchus or lung: Secondary | ICD-10-CM

## 2021-06-17 DIAGNOSIS — C3431 Malignant neoplasm of lower lobe, right bronchus or lung: Secondary | ICD-10-CM | POA: Diagnosis not present

## 2021-06-17 DIAGNOSIS — Z9981 Dependence on supplemental oxygen: Secondary | ICD-10-CM | POA: Diagnosis not present

## 2021-06-17 DIAGNOSIS — C3490 Malignant neoplasm of unspecified part of unspecified bronchus or lung: Secondary | ICD-10-CM

## 2021-06-17 DIAGNOSIS — I358 Other nonrheumatic aortic valve disorders: Secondary | ICD-10-CM | POA: Diagnosis not present

## 2021-06-17 DIAGNOSIS — J90 Pleural effusion, not elsewhere classified: Secondary | ICD-10-CM | POA: Diagnosis not present

## 2021-06-17 LAB — CMP (CANCER CENTER ONLY)
ALT: 14 U/L (ref 0–44)
AST: 20 U/L (ref 15–41)
Albumin: 3.6 g/dL (ref 3.5–5.0)
Alkaline Phosphatase: 78 U/L (ref 38–126)
Anion gap: 9 (ref 5–15)
BUN: 41 mg/dL — ABNORMAL HIGH (ref 8–23)
CO2: 24 mmol/L (ref 22–32)
Calcium: 9 mg/dL (ref 8.9–10.3)
Chloride: 109 mmol/L (ref 98–111)
Creatinine: 1.73 mg/dL — ABNORMAL HIGH (ref 0.61–1.24)
GFR, Estimated: 39 mL/min — ABNORMAL LOW (ref >60)
Glucose, Bld: 97 mg/dL (ref 70–99)
Potassium: 4.5 mmol/L (ref 3.5–5.1)
Sodium: 142 mmol/L (ref 135–145)
Total Bilirubin: 0.6 mg/dL (ref 0.3–1.2)
Total Protein: 6.2 g/dL — ABNORMAL LOW (ref 6.5–8.1)

## 2021-06-17 LAB — CBC WITH DIFFERENTIAL (CANCER CENTER ONLY)
Abs Immature Granulocytes: 0.01 10*3/uL (ref 0.00–0.07)
Basophils Absolute: 0 10*3/uL (ref 0.0–0.1)
Basophils Relative: 0 %
Eosinophils Absolute: 0 10*3/uL (ref 0.0–0.5)
Eosinophils Relative: 1 %
HCT: 32.3 % — ABNORMAL LOW (ref 39.0–52.0)
Hemoglobin: 10.1 g/dL — ABNORMAL LOW (ref 13.0–17.0)
Immature Granulocytes: 0 %
Lymphocytes Relative: 18 %
Lymphs Abs: 0.6 10*3/uL — ABNORMAL LOW (ref 0.7–4.0)
MCH: 29.2 pg (ref 26.0–34.0)
MCHC: 31.3 g/dL (ref 30.0–36.0)
MCV: 93.4 fL (ref 80.0–100.0)
Monocytes Absolute: 0.5 10*3/uL (ref 0.1–1.0)
Monocytes Relative: 14 %
Neutro Abs: 2.4 10*3/uL (ref 1.7–7.7)
Neutrophils Relative %: 67 %
Platelet Count: 73 10*3/uL — ABNORMAL LOW (ref 150–400)
RBC: 3.46 MIL/uL — ABNORMAL LOW (ref 4.22–5.81)
RDW: 16.3 % — ABNORMAL HIGH (ref 11.5–15.5)
WBC Count: 3.5 10*3/uL — ABNORMAL LOW (ref 4.0–10.5)
nRBC: 0 % (ref 0.0–0.2)

## 2021-06-17 MED ORDER — SODIUM CHLORIDE 0.9% FLUSH
10.0000 mL | Freq: Once | INTRAVENOUS | Status: AC
  Administered 2021-06-17: 10 mL
  Filled 2021-06-17: qty 10

## 2021-06-17 MED ORDER — HEPARIN SOD (PORK) LOCK FLUSH 100 UNIT/ML IV SOLN
500.0000 [IU] | Freq: Once | INTRAVENOUS | Status: AC
  Administered 2021-06-17: 500 [IU]
  Filled 2021-06-17: qty 5

## 2021-06-17 NOTE — Addendum Note (Signed)
Addended by: Scot Dock on: 06/17/2021 04:58 PM   Modules accepted: Orders

## 2021-06-17 NOTE — Patient Instructions (Signed)

## 2021-06-19 ENCOUNTER — Other Ambulatory Visit: Payer: Self-pay

## 2021-06-19 ENCOUNTER — Inpatient Hospital Stay (HOSPITAL_BASED_OUTPATIENT_CLINIC_OR_DEPARTMENT_OTHER): Payer: Medicare Other | Admitting: Internal Medicine

## 2021-06-19 ENCOUNTER — Telehealth: Payer: Self-pay | Admitting: Internal Medicine

## 2021-06-19 VITALS — BP 122/52 | HR 57 | Temp 97.9°F | Resp 18 | Ht 67.0 in | Wt 238.4 lb

## 2021-06-19 DIAGNOSIS — C3491 Malignant neoplasm of unspecified part of right bronchus or lung: Secondary | ICD-10-CM

## 2021-06-19 DIAGNOSIS — C3431 Malignant neoplasm of lower lobe, right bronchus or lung: Secondary | ICD-10-CM | POA: Diagnosis not present

## 2021-06-19 DIAGNOSIS — Z9981 Dependence on supplemental oxygen: Secondary | ICD-10-CM | POA: Diagnosis not present

## 2021-06-19 DIAGNOSIS — Z5111 Encounter for antineoplastic chemotherapy: Secondary | ICD-10-CM

## 2021-06-19 DIAGNOSIS — C3412 Malignant neoplasm of upper lobe, left bronchus or lung: Secondary | ICD-10-CM | POA: Diagnosis not present

## 2021-06-19 DIAGNOSIS — Z79899 Other long term (current) drug therapy: Secondary | ICD-10-CM | POA: Diagnosis not present

## 2021-06-19 DIAGNOSIS — Z8673 Personal history of transient ischemic attack (TIA), and cerebral infarction without residual deficits: Secondary | ICD-10-CM | POA: Diagnosis not present

## 2021-06-19 NOTE — Telephone Encounter (Signed)
I called patient wife, she advised that they seen Oncology doctor- they mentioned that he was having swelling again in both of his legs (knee down). He has had some weight gain, about 1-2lbs. Patient wife mentions that the decrease in Lasix is not helping. I reviewed result note of labs, where MD suggested to stop other medication and hold Lasix for 2 days, but resume 40 mg twice daily. Patient wife did not realize this, she was only giving him 40 mg daily. She will restart the twice daily, and call back on Monday if nothing is any better. I advised with wife I would send over a message to make Dr.Hilty aware and see if he had any other suggestions.

## 2021-06-19 NOTE — Telephone Encounter (Signed)
Pt c/o swelling: STAT is pt has developed SOB within 24 hours  How much weight have you gained and in what time span?  Patient's wife states the patient gains about 1-2 lbs every every. If swelling, where is the swelling located?  Legs and patient's wife states the doctor at the cancer center informed them that the patient has fluid around his lungs and heart as well.  Are you currently taking a fluid pill?  Yes, patient takes furosemide (LASIX) 20 MG tablet  Are you currently SOB?  No   Do you have a log of your daily weights (if so, list)?  No log available   Have you gained 3 pounds in a day or 5 pounds in a week?  Patient's wife states the patient has gained about 1-2 lbs daily ever since his Lasix was decreased  Have you traveled recently?  No but patient's wife states the patient sits in his recliner for extended periods of time and she has to remind him to elevate his feet

## 2021-06-19 NOTE — Telephone Encounter (Signed)
Pt's wife returning call.

## 2021-06-19 NOTE — Progress Notes (Signed)
[place] [phone]   [phone]  OFFICE PROGRESS NOTE  [name], MD [address] [place] [address]  DIAGNOSIS: Stage IV (T4, N2, M1 a) non-small cell lung cancer, mucinous adenocarcinoma presented with bilateral large masses in the left upper lobe as well as the right lower lobe in addition to other small nodules in the right upper lobe with hilar and mediastinal lymphadenopathy diagnosed in August 2021.  Biomarker Findings Microsatellite status - MS-Stable Tumor Mutational Burden - 4 Muts/Mb Genomic Findings For a complete list of the genes assayed, please refer to the Appendix. KRAS G12C GNAS R201H 7 Disease relevant genes with no reportable alterations: ALK, BRAF, EGFR, ERBB2, MET, RET, ROS1  PDL1 Expression: 20%.  PRIOR THERAPY: Systemic chemotherapy with carboplatin for AUC of 5, Alimta 500 mg/M2 and Keytruda 200 mg IV every 3 weeks.  First dose 09/09/2020.  Status post 6 cycles.  Starting from cycle #5 he has been on maintenance treatment with Alimta and Keytruda.  CURRENT THERAPY: Lumakras (Sotorasib) 960 mg p.o. daily.  First dose started 01/25/2021.  Status post 5 months of treatment.  INTERVAL HISTORY: Randall Wilkins 80 y.o. male returns to the clinic today for follow-up visit accompanied by his wife.  The patient is feeling fine except for the fatigue and baseline shortness of breath increased with exertion.  He is currently on home oxygen.  He gained around 12 pounds since his last visit mainly because of fluid retention.  His cardiologist Dr. Debara Pickett reduced his dose of Lasix from 80 mg to 40 mg over the last few weeks and the patient has significant swelling of the lower extremities but he also had anasarca seen on the recent scan.  He denied having any current chest pain, cough or hemoptysis.  He denied having any nausea, vomiting, diarrhea or constipation.  He has no headache or visual changes.  He had a  lot of diarrhea after drinking the oral contrast for the recent scan.  He continues to tolerate his treatment with Lumakras (Sotorasib) fairly well.  The patient had repeat CT scan of the chest, abdomen pelvis performed recently and he is here for evaluation and discussion of his scan results.    MEDICAL HISTORY: Past Medical History:  Diagnosis Date   Aortic stenosis, mild 11/14/2013   Aortic valve sclerosis 03/29/2015   Bilateral leg edema 05/21/2014   CAD (coronary artery disease)    Chronic diastolic congestive heart failure (HCC)    Chronic kidney disease (CKD), stage III (moderate) (HCC)    Diabetes 1.5, managed as type 1 ([place]) 02/04/2013   Diabetic peripheral neuropathy associated with type 1 diabetes mellitus ([place]) 11/14/2013   Dysphagia    Dyspnea on exertion 03/21/2015   Exogenous obesity    Gout    Heart attack ([place])    History of nuclear stress test 07/2011   dipyridamole; fixed inferolateral defect, worse at stress than rest; no reversible ischemia; low risk scan    Hyperlipidemia    Hypertension    Hypothyroidism    Insulin dependent diabetes mellitus    Left foot drop    Left main coronary artery disease 03/28/2015   lung ca dx'd 08/2020   Memory loss    Obesity (BMI 30-39.9) 11/14/2013   Obstructive sleep apnea 03/21/2015   OSA on CPAP    uses a cpap   Peptic ulcer with hemorrhage 03/28/2015   Peripheral neuropathy    Pneumonia  Rhabdomyolysis    S/P CABG x 2 03/29/2015   LIMA to Diagonal, SVG to OM, EVH via right thigh   Stroke ([place])    L patietal with small scattered lacunar infarcts   Thrombocytopenia ([place]) 03/21/2015   Venous insufficiency    Weakness generalized 03/21/2015    ALLERGIES:  is allergic to actos [pioglitazone], spironolactone, metformin and related, niaspan [niacin er], and wound dressing adhesive.  MEDICATIONS:  Current Outpatient Medications  Medication Sig Dispense Refill   allopurinol (ZYLOPRIM) 100 MG tablet TAKE ONE TABLET EVERY DAY 30 tablet 6    amLODipine (NORVASC) 10 MG tablet TAKE ONE TABLET BY MOUTH ONCE DAILY 90 tablet 3   apixaban (ELIQUIS) 5 MG TABS tablet Take 1 tablet (5 mg total) by mouth 2 (two) times daily. 60 tablet 11   Blood Glucose Monitoring Suppl (CONTOUR NEXT MONITOR) w/Device KIT 1 Device by Does not apply route 3 (three) times daily. Use to check blood sugars 3 times daily. Dx Code E13.9 1 kit 2   CONTOUR NEXT TEST test strip USE 1 STRIP TO CHECK GLUCOSE 4 TIMES DAILY 300 each 1   furosemide (LASIX) 20 MG tablet Take 2 tablets (40 mg total) by mouth 2 (two) times daily. 450 tablet 2   hydrALAZINE (APRESOLINE) 25 MG tablet Take 1.5 tablets (37.5 mg total) by mouth in the morning and at bedtime. 270 tablet 3   insulin degludec (TRESIBA FLEXTOUCH) 200 UNIT/ML FlexTouch Pen Inject 12 Units into the skin at bedtime.     insulin lispro (HUMALOG) 100 UNIT/ML cartridge Inject 5-15 Units into the skin 3 (three) times daily with meals. Sliding scale.     Insulin Pen Needle (EASY COMFORT PEN NEEDLES) 33G X 4 MM MISC 1 each by Does not apply route See admin instructions. Use to inject insulin 5 times daily. 150 each 3   isosorbide dinitrate (ISORDIL) 20 MG tablet Take 1 tablet (20 mg total) by mouth in the morning and at bedtime. 180 tablet 3   levothyroxine (SYNTHROID, LEVOTHROID) 75 MCG tablet Take 75 mcg by mouth daily before breakfast.   1   lidocaine-prilocaine (EMLA) cream APPLY A TEASPOON OVER PORT SITE AT LEASTONE HOUR BEFORE LAB APPOINTMENT. DO NOT RUB IN AND COVER WITH PLASTIC WRAP (Patient taking differently: Apply 1 application topically See admin instructions. Apply a teaspoon over port site at least one hour before lab appointment. Do not rub in and cover with plastic wrap.) 30 g 0   lisinopril (ZESTRIL) 10 MG tablet Take 1 tablet (10 mg total) by mouth daily. 90 tablet 3   metFORMIN (GLUCOPHAGE-XR) 750 MG 24 hr tablet TAKE 1 TABLET WITH DINNER 90 tablet 1   OXYGEN Inhale 2 L into the lungs. At bedtime and during the  day prn     potassium chloride SA (K-DUR,KLOR-CON) 20 MEQ tablet Take 20 mEq by mouth 2 (two) times daily.      rosuvastatin (CRESTOR) 20 MG tablet Take 20 mg by mouth daily.     sotorasib 120 MG TABS TAKE 960 MG BY MOUTH EVERY MORNING. (Patient taking differently: Take 960 mg by mouth daily.) 240 tablet 3   Tamsulosin HCl (FLOMAX) 0.4 MG CAPS Take 0.4 mg by mouth at bedtime.     vitamin B-12 (CYANOCOBALAMIN) 1000 MCG tablet Take 1,000 mcg by mouth at bedtime.     amitriptyline (ELAVIL) 50 MG tablet Take 50 mg by mouth at bedtime.     colchicine 0.6 MG tablet Take 0.6 mg by mouth 3 (  three) times daily as needed. (Patient not taking: Reported on 06/19/2021)     ondansetron (ZOFRAN) 4 MG tablet Take 1 tablet (4 mg total) by mouth every 6 (six) hours as needed for nausea. (Patient not taking: Reported on 06/19/2021) 20 tablet 0   prochlorperazine (COMPAZINE) 10 MG tablet Take 1 tablet (10 mg total) by mouth every 6 (six) hours as needed for nausea or vomiting. (Patient not taking: No sig reported) 30 tablet 0   No current facility-administered medications for this visit.    SURGICAL HISTORY:  Past Surgical History:  Procedure Laterality Date   BACK SURGERY  2002   lumbosacral. 11 back surgeries total   BRONCHIAL BIOPSY  08/06/2020   Procedure: BRONCHIAL BIOPSIES;  Surgeon: Collene Gobble, MD;  Location[facility] ENDOSCOPY;  Service: Pulmonary;;   BRONCHIAL BRUSHINGS  08/06/2020   Procedure: BRONCHIAL BRUSHINGS;  Surgeon: Collene Gobble, MD;  Location: [facility] ENDOSCOPY;  Service: Pulmonary;;   BRONCHIAL NEEDLE ASPIRATION BIOPSY  08/06/2020   Procedure: BRONCHIAL NEEDLE ASPIRATION BIOPSIES;  Surgeon: Collene Gobble, MD;  Location: [facility] ENDOSCOPY;  Service: Pulmonary;;   CARDIOVERSION N/A 05/06/2021   Procedure: CARDIOVERSION;  Surgeon: Geralynn Rile, MD;  Location: [facility];  Service: Cardiovascular;  Laterality: N/A;   Carotid Doppler  03/2013   bilat bulb/prox ICAs - mild amount of fibrous  plaque with no evidence of diameter reduction   CARPAL TUNNEL RELEASE Bilateral 08/09/2014   Procedure: BILATERAL CARPAL TUNNEL RELEASE;  Surgeon: Daryll Brod, MD;  Location: [place];  Service: Orthopedics;  Laterality: Bilateral;  ANESTHESIA:  IV REGIONAL BIL FAB   CHOLECYSTECTOMY     COLONOSCOPY     CORONARY ANGIOPLASTY  10/13/1996   CORONARY ANGIOPLASTY  09/21/1989   emergency PTCA   CORONARY ANGIOPLASTY  10/13/1996   Multi-Link diagonal & OD stenting (Dr. Marella Chimes)   [place]  12/03/1997   disease of mid DX-1 ~50% & in mid PLA & PDA (distal lesions) (Dr. Marella Chimes)    CORONARY ANGIOPLASTY  10/14/1999   progression of disease distal PLA & PDA; progression of disease prox RCA - moderate (Dr. Marella Chimes)    CORONARY ANGIOPLASTY WITH STENT PLACEMENT  04/04/2004   4.0x54mm non-DES (thrombectomy via AngioJet) to RCA for high grade stenosis (Dr. Marella Chimes)   CORONARY ARTERY BYPASS GRAFT N/A 03/29/2015   Procedure: CORONARY ARTERY BYPASS GRAFTING TIMES TWO USING LEFT INTERNAL MAMMARY ARTERY AND RIGHT LEG GREATER SAPHENOUS VEIN HARVESTED ENDOSCOPICALLY.;  Surgeon: Rexene Alberts, MD;  Location: [facility];  Service: Open Heart Surgery;  Laterality: N/A;   ESOPHAGOGASTRODUODENOSCOPY N/A 03/27/2015   Procedure: ESOPHAGOGASTRODUODENOSCOPY (EGD);  Surgeon: Clarene Essex, MD;  Location: [facility] ENDOSCOPY;  Service: Endoscopy;  Laterality: N/A;  possible dilation   ESOPHAGOGASTRODUODENOSCOPY N/A 10/05/2020   Procedure: ESOPHAGOGASTRODUODENOSCOPY (EGD);  Surgeon: Arta Silence, MD;  Location: [facility] ENDOSCOPY;  Service: Endoscopy;  Laterality: N/A;   EYE SURGERY Right    macular fold-02/2021   FINE NEEDLE ASPIRATION  08/06/2020   Procedure: FINE NEEDLE ASPIRATION (FNA) LINEAR;  Surgeon: Collene Gobble, MD;  Location: [place] [facility] ENDOSCOPY;  Service: Pulmonary;;   IR IMAGING GUIDED PORT INSERTION  10/07/2020   LEFT HEART CATHETERIZATION WITH CORONARY ANGIOGRAM N/A 03/28/2015    Procedure: LEFT HEART CATHETERIZATION WITH CORONARY ANGIOGRAM;  Surgeon: Peter M Martinique, MD;  Location: [facility] CATH LAB;  Service: Cardiovascular;  Laterality: N/A;   SINUS ENDO W/FUSION     TEE WITHOUT CARDIOVERSION N/A 05/06/2021   Procedure: TRANSESOPHAGEAL ECHOCARDIOGRAM (TEE);  Surgeon: Geralynn Rile, MD;  Location: [place];  Service: Cardiovascular;  Laterality: N/A;   TONSILLECTOMY     TRANSTHORACIC ECHOCARDIOGRAM  08/08/2013   EF 55-60%, mild conc hypertrophy, grade 1 diastolic dysfunction; AV with mild stenosis; LA & RA mildly dilated   VIDEO BRONCHOSCOPY WITH ENDOBRONCHIAL NAVIGATION N/A 08/06/2020   Procedure: VIDEO BRONCHOSCOPY WITH ENDOBRONCHIAL NAVIGATION;  Surgeon: Collene Gobble, MD;  Location: [facility] ENDOSCOPY;  Service: Pulmonary;  Laterality: N/A;   VIDEO BRONCHOSCOPY WITH ENDOBRONCHIAL ULTRASOUND N/A 08/06/2020   Procedure: VIDEO BRONCHOSCOPY WITH ENDOBRONCHIAL ULTRASOUND;  Surgeon: Collene Gobble, MD;  Location: [facility] ENDOSCOPY;  Service: Pulmonary;  Laterality: N/A;    REVIEW OF SYSTEMS:  Constitutional: positive for fatigue Eyes: negative Ears, nose, mouth, throat, and face: negative Respiratory: positive for dyspnea on exertion Cardiovascular: negative Gastrointestinal: negative Genitourinary:negative Integument/breast: negative Hematologic/lymphatic: negative Musculoskeletal:negative Neurological: negative Behavioral/Psych: negative Endocrine: negative Allergic/Immunologic: negative   PHYSICAL EXAMINATION: General appearance: alert, cooperative, fatigued, and no distress Head: Normocephalic, without obvious abnormality, atraumatic Neck: no adenopathy, no JVD, supple, symmetrical, trachea midline, and thyroid not enlarged, symmetric, no tenderness/mass/nodules Lymph nodes: Cervical, supraclavicular, and axillary nodes normal. Resp: clear to auscultation bilaterally Back: symmetric, no curvature. ROM normal. No CVA tenderness. Cardio: regular rate and  rhythm, S1, S2 normal, no murmur, click, rub or gallop GI: soft, non-tender; bowel sounds normal; no masses,  no organomegaly Extremities: edema 1 + edema bilaterally. Neurologic: Alert and oriented X 3, normal strength and tone. Normal symmetric reflexes. Normal coordination and gait  ECOG PERFORMANCE STATUS: 1 - Symptomatic but completely ambulatory  Blood pressure (!) 122/52, pulse (!) 57, temperature 97.9 F (36.6 C), temperature source Tympanic, resp. rate 18, height $RemoveBe'5\' 7"'yPRbxRfzu$  (1.702 m), weight 238 lb 6.4 oz (108.1 kg), SpO2 98 %.  LABORATORY DATA: Lab Results  Component Value Date   WBC 3.5 (L) 06/17/2021   HGB 10.1 (L) 06/17/2021   HCT 32.3 (L) 06/17/2021   MCV 93.4 06/17/2021   PLT 73 (L) 06/17/2021      Chemistry      Component Value Date/Time   NA 142 06/17/2021 1254   NA 141 06/11/2021 0937   K 4.5 06/17/2021 1254   CL 109 06/17/2021 1254   CO2 24 06/17/2021 1254   BUN 41 (H) 06/17/2021 1254   BUN 43 (H) 06/11/2021 0937   CREATININE 1.73 (H) 06/17/2021 1254   CREATININE 1.30 (H) 04/30/2020 1044      Component Value Date/Time   CALCIUM 9.0 06/17/2021 1254   ALKPHOS 78 06/17/2021 1254   AST 20 06/17/2021 1254   ALT 14 06/17/2021 1254   BILITOT 0.6 06/17/2021 1254       RADIOGRAPHIC STUDIES: CT Abdomen Pelvis Wo Contrast  Result Date: 06/18/2021 CLINICAL DATA:  Non-small cell lung cancer staging, oral chemotherapy ongoing EXAM: CT CHEST, ABDOMEN AND PELVIS WITHOUT CONTRAST TECHNIQUE: Multidetector CT imaging of the chest, abdomen and pelvis was performed following the standard protocol without IV contrast. COMPARISON:  03/18/2021 FINDINGS: CT CHEST FINDINGS Cardiovascular: Right chest port catheter. Aortic atherosclerosis. Aortic valve calcifications. Cardiomegaly. Three-vessel coronary artery calcifications and stents. No pericardial effusion. Mediastinum/Nodes: No enlarged mediastinal, hilar, or axillary lymph nodes. Thyroid gland, trachea, and esophagus demonstrate  no significant findings. Lungs/Pleura: Small, right greater than left bilateral pleural effusions, increased compared to prior examination. Unchanged post treatment appearance of masslike consolidations of the lingula (series 6, image 79) posterior right upper lobe (series 6, image 72), right middle lobe (series 6, image 104). There is redemonstrated  severe fibrotic scarring or congenital cystic malformation of the right lower lobe (series 6, image 115). Musculoskeletal: No chest wall mass or suspicious bone lesions identified. CT ABDOMEN PELVIS FINDINGS Hepatobiliary: No focal liver abnormality is seen. Status post cholecystectomy. No biliary dilatation. Pancreas: Unremarkable. No pancreatic ductal dilatation or surrounding inflammatory changes. Spleen: Mild splenomegaly, maximum coronal span 14.1 cm, similar to prior examination. Adrenals/Urinary Tract: Adrenal glands are unremarkable. No significant change in noncontrast appearance of a partially exophytic mass of the lateral midportion of the left kidney (series 2, image 81). Kidneys are otherwise normal, without renal calculi, solid lesion, or hydronephrosis. Bladder is unremarkable. Stomach/Bowel: Stomach is within normal limits. Appendix appears normal. No evidence of bowel wall thickening, distention, or inflammatory changes. Vascular/Lymphatic: Aortic atherosclerosis. No enlarged abdominal or pelvic lymph nodes. Reproductive: No mass or other abnormality. Other: Anasarca, new compared to prior examination. Trace perihepatic ascites, new compared to prior examination. Musculoskeletal: No acute or significant osseous findings. Unchanged partially calcified subcutaneous mass overlying the left gluteus musculature and sacrum, measuring approximately 13.0 x 7.0 cm (series 2, image 118). IMPRESSION: 1. Unchanged post treatment appearance of masslike consolidations of the lingula, posterior right upper lobe, and right middle lobe. 2. No noncontrast evidence of  metastatic disease within the abdomen or pelvis. 3. No significant change in noncontrast appearance of a partially exophytic mass of the lateral midportion of the left kidney, better characterized on prior contrast enhanced examinations as probable renal cell carcinoma. Attention on follow-up. 4. No significant change in noncontrast appearance of a partially calcified subcutaneous mass overlying the left gluteus musculature and sacrum, measuring approximately 13.0 x 7.0 cm. 5. Increased bilateral pleural effusions, without direct noncontrast evidence of pleural metastatic disease. 6. Anasarca, new compared to prior examination. Trace perihepatic ascites, new compared to prior examination. 7. Coronary artery disease. 8. Aortic valve calcifications. Correlate for echocardiographic evidence of aortic valve dysfunction. Aortic Atherosclerosis (ICD10-I70.0). Electronically Signed   By: Eddie Candle M.D.   On: 06/18/2021 08:20   CT Chest Wo Contrast  Result Date: 06/18/2021 CLINICAL DATA:  Non-small cell lung cancer staging, oral chemotherapy ongoing EXAM: CT CHEST, ABDOMEN AND PELVIS WITHOUT CONTRAST TECHNIQUE: Multidetector CT imaging of the chest, abdomen and pelvis was performed following the standard protocol without IV contrast. COMPARISON:  03/18/2021 FINDINGS: CT CHEST FINDINGS Cardiovascular: Right chest port catheter. Aortic atherosclerosis. Aortic valve calcifications. Cardiomegaly. Three-vessel coronary artery calcifications and stents. No pericardial effusion. Mediastinum/Nodes: No enlarged mediastinal, hilar, or axillary lymph nodes. Thyroid gland, trachea, and esophagus demonstrate no significant findings. Lungs/Pleura: Small, right greater than left bilateral pleural effusions, increased compared to prior examination. Unchanged post treatment appearance of masslike consolidations of the lingula (series 6, image 79) posterior right upper lobe (series 6, image 72), right middle lobe (series 6, image 104).  There is redemonstrated severe fibrotic scarring or congenital cystic malformation of the right lower lobe (series 6, image 115). Musculoskeletal: No chest wall mass or suspicious bone lesions identified. CT ABDOMEN PELVIS FINDINGS Hepatobiliary: No focal liver abnormality is seen. Status post cholecystectomy. No biliary dilatation. Pancreas: Unremarkable. No pancreatic ductal dilatation or surrounding inflammatory changes. Spleen: Mild splenomegaly, maximum coronal span 14.1 cm, similar to prior examination. Adrenals/Urinary Tract: Adrenal glands are unremarkable. No significant change in noncontrast appearance of a partially exophytic mass of the lateral midportion of the left kidney (series 2, image 81). Kidneys are otherwise normal, without renal calculi, solid lesion, or hydronephrosis. Bladder is unremarkable. Stomach/Bowel: Stomach is within normal limits. Appendix appears normal. No  evidence of bowel wall thickening, distention, or inflammatory changes. Vascular/Lymphatic: Aortic atherosclerosis. No enlarged abdominal or pelvic lymph nodes. Reproductive: No mass or other abnormality. Other: Anasarca, new compared to prior examination. Trace perihepatic ascites, new compared to prior examination. Musculoskeletal: No acute or significant osseous findings. Unchanged partially calcified subcutaneous mass overlying the left gluteus musculature and sacrum, measuring approximately 13.0 x 7.0 cm (series 2, image 118). IMPRESSION: 1. Unchanged post treatment appearance of masslike consolidations of the lingula, posterior right upper lobe, and right middle lobe. 2. No noncontrast evidence of metastatic disease within the abdomen or pelvis. 3. No significant change in noncontrast appearance of a partially exophytic mass of the lateral midportion of the left kidney, better characterized on prior contrast enhanced examinations as probable renal cell carcinoma. Attention on follow-up. 4. No significant change in  noncontrast appearance of a partially calcified subcutaneous mass overlying the left gluteus musculature and sacrum, measuring approximately 13.0 x 7.0 cm. 5. Increased bilateral pleural effusions, without direct noncontrast evidence of pleural metastatic disease. 6. Anasarca, new compared to prior examination. Trace perihepatic ascites, new compared to prior examination. 7. Coronary artery disease. 8. Aortic valve calcifications. Correlate for echocardiographic evidence of aortic valve dysfunction. Aortic Atherosclerosis (ICD10-I70.0). Electronically Signed   By: Eddie Candle M.D.   On: 06/18/2021 08:20   OCT, Retina - OU - Both Eyes  Result Date: 05/21/2021 Right Eye Quality was good. Scan locations included subfoveal. Central Foveal Thickness: 355. Progression has been stable. Findings include cystoid macular edema. Left Eye Quality was good. Scan locations included subfoveal. Central Foveal Thickness: 324. Progression has been stable. Findings include epiretinal membrane.     ASSESSMENT AND PLAN: This is a very pleasant 80 years old white male diagnosed with a stage IV (T4, N2, M1 a) non-small cell lung cancer, mucinous adenocarcinoma presented with bilateral large masses in the left upper lobe as well as the right upper lobe in addition to other pulmonary nodules in the right upper lobe with hilar and mediastinal lymphadenopathy diagnosed in August 2021. Molecular study showed positive KRAS G12C mutation and PD-L1 expression of 20%. The patient is currently undergoing systemic chemotherapy with carboplatin for AUC of 5, Alimta 500 mg/M2 and Keytruda 200 mg IV every 3 weeks status post 6 cycles. Starting from cycle #5 the patient is on maintenance treatment with Alimta and Keytruda every 3 weeks.  His treatment was discontinued secondary to disease progression.  The patient is currently on oral treatment with Lumakras (Sotorasib) 960 mg p.o. daily started on 01/25/2021.  Status post 5 months of treatment  and has been tolerating this treatment well except for itching. The patient had repeat CT scan of the chest, abdomen pelvis performed recently.  I personally and independently reviewed the scan images and discussed the results with the patient and his wife. His scan showed no concerning findings for disease progression but there was increased and bilateral pleural effusion right more than left with anasarca likely secondary to his congestive heart failure and decrease in the diuretic dose. I recommended for the patient to continue his current treatment with Lumakras (Sotorasib) with the same dose. Regarding the anasarca and lower extremity swelling, he will reach out to his cardiologist Dr. Debara Pickett for consideration of increasing his diuretic dose and adjustment of his medication. I will see the patient back for follow-up visit in 1 months for evaluation and repeat blood work. He was advised to call immediately if he has any concerning symptoms in the interval. The patient voices  understanding of current disease status and treatment options and is in agreement with the current care plan.  All questions were answered. The patient knows to call the clinic with any problems, questions or concerns. We can certainly see the patient much sooner if necessary.  Disclaimer: This note was dictated with voice recognition software. Similar sounding words can inadvertently be transcribed and may not be corrected upon review.

## 2021-06-19 NOTE — Telephone Encounter (Signed)
LVM for return call. 

## 2021-06-20 ENCOUNTER — Telehealth: Payer: Self-pay | Admitting: Internal Medicine

## 2021-06-20 NOTE — Telephone Encounter (Signed)
Lasix 40 mg TWICE daily - no metolazone  Dr Lemmie Evens

## 2021-06-20 NOTE — Telephone Encounter (Signed)
Scheduled per los. Called and spoke with patients wife. Confirmed appt

## 2021-06-24 ENCOUNTER — Telehealth: Payer: Self-pay | Admitting: Internal Medicine

## 2021-06-24 NOTE — Telephone Encounter (Signed)
Patient's wife called in with patient weight due to being ask by dr  July [phone]  July 9 237 July 10 235 July 11 235   July 12 234

## 2021-06-24 NOTE — Telephone Encounter (Signed)
Returned call to patient's wife Patient is taking lasix as prescribed 40mg  BID (after 7/7 note) She reports leg swelling has improved some and leg redness has improved Advised to continue current med regimen and I will send note to Dr. Debara Pickett to review   Appointment 07/01/21 with MD

## 2021-07-01 ENCOUNTER — Encounter: Payer: Self-pay | Admitting: Internal Medicine

## 2021-07-01 ENCOUNTER — Ambulatory Visit (INDEPENDENT_AMBULATORY_CARE_PROVIDER_SITE_OTHER): Payer: Medicare Other | Admitting: Internal Medicine

## 2021-07-01 ENCOUNTER — Other Ambulatory Visit: Payer: Self-pay

## 2021-07-01 VITALS — BP 132/58 | HR 69 | Ht 67.0 in | Wt 236.0 lb

## 2021-07-01 DIAGNOSIS — I10 Essential (primary) hypertension: Secondary | ICD-10-CM | POA: Diagnosis not present

## 2021-07-01 DIAGNOSIS — E1129 Type 2 diabetes mellitus with other diabetic kidney complication: Secondary | ICD-10-CM | POA: Diagnosis not present

## 2021-07-01 DIAGNOSIS — N183 Chronic kidney disease, stage 3 unspecified: Secondary | ICD-10-CM | POA: Diagnosis not present

## 2021-07-01 DIAGNOSIS — Z951 Presence of aortocoronary bypass graft: Secondary | ICD-10-CM

## 2021-07-01 DIAGNOSIS — I5043 Acute on chronic combined systolic (congestive) and diastolic (congestive) heart failure: Secondary | ICD-10-CM | POA: Diagnosis not present

## 2021-07-01 DIAGNOSIS — E1149 Type 2 diabetes mellitus with other diabetic neurological complication: Secondary | ICD-10-CM | POA: Diagnosis not present

## 2021-07-01 DIAGNOSIS — I4892 Unspecified atrial flutter: Secondary | ICD-10-CM

## 2021-07-01 MED ORDER — FUROSEMIDE 40 MG PO TABS: 80.0000 mg | ORAL_TABLET | Freq: Two times a day (BID) | ORAL | 1 refills | Status: DC

## 2021-07-01 NOTE — Patient Instructions (Signed)
Medication Instructions:  INCREASE lasix to 80mg  twice daily  *If you need a refill on your cardiac medications before your next appointment, please call your pharmacy*   Lab Work: BMET & BNP in 3 weeks at cancer center  If you have labs (blood work) drawn today and your tests are completely normal, you will receive your results only by: [place] (if you have MyChart) OR A paper copy in the mail If you have any lab test that is abnormal or we need to change your treatment, we will call you to review the results.   Testing/Procedures: NONE   Follow-Up: At [facility], you and your health needs are our priority.  As part of our continuing mission to provide you with exceptional heart care, we have created designated Provider Care Teams.  These Care Teams include your primary Cardiologist (physician) and Advanced Practice Providers (APPs -  Physician Assistants and Nurse Practitioners) who all work together to provide you with the care you need, when you need it.  We recommend signing up for the patient portal called "MyChart".  Sign up information is provided on this After Visit Summary.  MyChart is used to connect with patients for Virtual Visits (Telemedicine).  Patients are able to view lab/test results, encounter notes, upcoming appointments, etc.  Non-urgent messages can be sent to your provider as well.   To learn more about what you can do with MyChart, go to [email or web address].    Your next appointment:   2 month(s)  The format for your next appointment:   In Person  Provider:   You may see Pixie Casino, MD or one of the following Advanced Practice Providers on your designated Care Team:   Almyra Deforest, PA-C Fabian Sharp, PA-C or  Roby Lofts, [place]   Other Instructions

## 2021-07-01 NOTE — Progress Notes (Signed)
OFFICE NOTE  Chief Complaint:  Follow-up hospitalization  Primary Care Physician: [name], MD  HPI:  Randall Wilkins is a pleasant 80 year old male patient who was formerly followed by Dr. Rollene Fare. He is here to establish cardiac care with [place] today. He has a history of obesity, multiple comorbidities including insulin-dependent diabetes, diabetic peripheral neuropathy, diabetic nephropathy, venous insufficiency and peripheral neuropathy. He also has obstructive sleep apnea on CPAP and coronary artery disease. He unfortunately suffered a left parietal stroke with small lacunar infarcts noted on MRI in 2013. He is a left foot drop from an L4/L5 neuropathy and wears a brace for this. From a cardiac standpoint he had a large non-drug-eluting stent placed in the RCA in 2005. He has had a stent placed at 97 to a diagonal which was noted to be patent. He's had some problems with dizziness which is improved. He [place] as large to medium secondary to neuropathy and venous insufficiency. Unfortunately not been able to lose a lot of weight due to his difficulty in ambulating and painful neuropathy.  He denies any cardiac chest pain or worsening shortness of breath.  [name] for a six-month followup. He is having no complaints. He does report some lower extremity swelling which is stable. He has mild aortic stenosis which is been stable as well. His last echo was in August 2014. He denies any chest pain or worsening shortness of breath. He recently had lab work to Dr. Eugenio Hoes office which we will obtain.  I saw [name] today for hospital follow-up. He is reportedly doing much better. He has more energy and no significant chest discomfort. Between our last appointment see presented with a urinary tract infection and ultimately had chest pain and profuse diaphoresis. He was found to have elevated troponin and underwent cardiac catheterization. This demonstrated the  following:  Coronary angiography: Coronary dominance: right  Left mainstem: 95% distal left main stenosis.  Left anterior descending (LAD): There is 95% ostial LAD stenosis. The LAD is a large vessel. The LAD and diagonal otherwise are without significant disease.  There is a large ramus intermediate branch with 99% ostial stenosis.  Left circumflex (LCx): 100% ostial occlusion with faint left to left collaterals.  Right coronary artery (RCA): the RCA is a large dominant branch. The stent in the proximal vessel is widely patent. There is a 60% stenosis in the mid PDA. The third PLOM is moderate in size with a long 90-95% stenosis prior to distal arborization.    Subsequently he underwent emergent coronary artery bypass grafting with a LIMA to LAD and SVG to OM branch. He is noted to have mild aortic sclerosis versus stenosis, however the valve was not severe enough to be addressed. Since discharge he is more active and is starting cardiac rehabilitation today.  [name] for follow-up. Overall he is feeling well and continues to improve in his ambulation and stamina. He denies any chest pain or shortness of breath. He is rehabilitating well. Recent laboratory work shows excellent cholesterol control and total cholesterol 130, HDL 42 LDL 61 and triglycerides 137. His only concern is the cost of medications.  I saw [name] today in the office. He denies any chest pain or worsening shortness of breath. Unfortunately he's had about several weeks of diarrhea which started when he went to the [facility] [place] [facility] area. It was not felt to be infectious any seeing a gastroenterologist about this. Otherwise he is without  cardiac complaints. Blood pressure today was low at 114/52. He's managed to lose only a couple of pounds but generally his appetite stools been pretty good despite the diarrhea. He is planning on having an upcoming injection and I received a request to hold his Plavix for 5  days prior to that. He is currently holding it will have his injection on Monday. Based on this I reviewed his records and I do not see a clear ongoing indication for the Plavix in addition to aspirin. Now that he's been surgically revascularized.   03/04/2017  [name] for follow-up. Recently he's had some dizziness and apparently had lower blood pressure. He backed off on his lisinopril to 2.5 mg daily, or at least he says he is taking a half tablet. Will try to figure out what the dose was. EKG today shows sinus bradycardia with either a first degree AV block of great significance or possibly atrial fibrillation. It's difficult to see consistent P waves with normal PP intervals. The RR intervals are slightly irregular. He has had sinus with a long first-degree AV block of 340 ms in the past. This is important to determine as he has had prior stroke and would necessitate anticoagulation.  03/23/2018  [name] for follow-up.  He has no specific complaints.  He continues to have a little instability with his gait.  He is using a cane today but has a walker at home.  Recently he has had the best blood sugars that he has had in a while.  He started seeing Dr. Dwyane Dee, who added Victoza.  He is also on [place] and Humalog.  He was in a clinical trial with Jardiance, and is to be considered given his history of coronary disease and diabetes for mortality benefit.  I will defer to Dr. Dwyane Dee.  Blood pressure is well controlled today.  Weight remains excessive.  EKG personally reviewed shows sinus rhythm with PVCs for which he is unaware of.  02/07/2019  Darril returns today for follow-up.  He is done well and has no new complaints.  Recently seen by Dr. Claiborne Billings and adjustments were made to his CPAP.  He is reportedly 100% compliant.  He was noted to have a type I winky block on EKG a couple weeks ago and his Toprol-XL was reduced from 37.5 to 25 mg.  He seems to be tolerating this  however blood pressures accordingly gone up.  Blood pressure today was 155/71 and home blood pressures are in the 150s.  03/13/2020  Masin is seen today in follow-up.  He is accompanied by his wife.  He scheduled to see Dr. Claiborne Billings in a couple weeks.  Recently he has been struggling with shortness of breath and fatigue.  He saw his primary care provider and diagnosed with a pneumonia.  He has been on antibiotics and does seem to be improving.  He has somewhat of a productive cough.  An EKG was performed today and shows atrial fibrillation controlled ventricular response.  This is newly documented.  He does not have known history of this although had had a prior stroke.  He is on low-dose aspirin.  05/03/2020  Rae is seen today in follow-up.  He just got [place] from the beach.  He remains in A. fib.  Fortunately his pneumonia is not resolving.  Repeat imaging still suggest multifocal infiltrates and his PCP apparently is going to refer him to pulmonary.  I suspect he may end up getting bronchoscopy  or other procedures.  This will complicate things if he were to have a cardioversion, particularly I would not want to stop his anticoagulation to allow for those procedures within a month after it.  At this point he remains in A. fib today but is rate controlled.  He has been fatigued again I think this is related his A. fib and his pneumonias.  He is essentially nonambulatory and difficult for his wife to push around.  They have asked about a powered wheelchair.  07/08/2020  Loden returns today for follow-up.  He appears to be an sinus rhythm with Mobitz 1 AV block.  He had this previously and has had some intermittent A. fib however there is no indication for cardioversion.  He is anticoagulated on Eliquis.  His breathing is worsened and recently had a super D CT scan of the chest ordered by Dr. Lamonte Sakai.  Unfortunate this shows a large 5 cm cavitary lung mass in the right lower lobe as well as some persistent lung  mass in the left upper lobe.  Is not clear to me whether this is tumor versus a possible pneumonic process.  He also reported that they had had some remodeling done in her house and they found extensive amounts of mold in the kitchen.  02/06/2021  Chilton is seen today in the office for follow-up.  He has had some progressive dyspnea.  EKG today shows what appears to be a long first-degree AV block, probably sinus rhythm with a Mobitz 1.  Looking back he had an EKG in October which is read as A. fib but appears to be regular with a long first-degree AV block.  His initial diagnosis of A. fib I think came from me earlier last year with an EKG in the office that was questionable for A. fib but also probably showed a long first-degree AV block.  Nevertheless he remains on Eliquis 2.5 mg twice daily.  His dyspnea has progressed somewhat.  He saw pulmonary and was started on Spiriva which may have improved a little.  He also has extensive lung cancer which may be causing his dyspnea.  He has reported a little bit of leg swelling.  Echo last year showed an EF of 48% with mild to moderate aortic stenosis.  03/11/2021  Luian is seen today for a cardiac clearance.  He is supposed to undergo periodontal surgery because apparently he has a cavity or abscess which may affect his bridge.  Apparently this is more urgent.  He would have to stop his Eliquis 2 days prior to the procedure and should start afterwards.  According to our pharmacist it was noted that his creatinine has improved slightly and is now less than 1.5 therefore he will qualify for the 48m twice daily dose.  He does note some improvement in shortness of breath which may have been due to some changes in his chemotherapy.  EKG today shows an atrial flutter at 73.  05/29/2021  LDaymeinreturns today for hospitalization follow-up.  I was fortunate to be rounding in the hospital and took care of him while he was there.  He presented with decompensated heart  failure and significant weight gain.  He was diuresed under 100 kg and had marked improvement in his symptoms.  He was also noted to be in atrial flutter.  I did pursue elective cardioversion however he converted to what appeared to be atrial fibrillation.  He seemed to maintain this for a while however now today he is  back in atrial flutter again with controlled ventricular response.  According to his wife his weight has been going up.  He has become more short of breath requiring oxygen.  He cannot wear his CPAP because he feels smothered.  Weight is down 102 kg, up from 98 kg at discharge.  07/01/2021  Dare is seen today in follow-up.  I had started him on metolazone however he had significant issues with worsening creatinine and volume contraction on this and it was discontinued.  He was then advised to go back to 40 mg twice daily Lasix.  Unfortunately there was some confusion he was only taking Lasix once a day.  He recently went back to twice daily dosing but weight had gone up to 238 pounds and currently is 236 pounds, this is about 10 to 12 pounds over his discharge weight (24 pounds).  BNP recently, however was only elevated at 142.  Creatinine is 1.73, with a GFR of 39.  PMHx:  Past Medical History:  Diagnosis Date   Aortic stenosis, mild 11/14/2013   Aortic valve sclerosis 03/29/2015   Bilateral leg edema 05/21/2014   CAD (coronary artery disease)    Chronic diastolic congestive heart failure (HCC)    Chronic kidney disease (CKD), stage III (moderate) (HCC)    Diabetes 1.5, managed as type 1 ([place]) 02/04/2013   Diabetic peripheral neuropathy associated with type 1 diabetes mellitus ([place]) 11/14/2013   Dysphagia    Dyspnea on exertion 03/21/2015   Exogenous obesity    Gout    Heart attack ([place])    History of nuclear stress test 07/2011   dipyridamole; fixed inferolateral defect, worse at stress than rest; no reversible ischemia; low risk scan    Hyperlipidemia    Hypertension     Hypothyroidism    Insulin dependent diabetes mellitus    Left foot drop    Left main coronary artery disease 03/28/2015   lung ca dx'd 08/2020   Memory loss    Obesity (BMI 30-39.9) 11/14/2013   Obstructive sleep apnea 03/21/2015   OSA on CPAP    uses a cpap   Peptic ulcer with hemorrhage 03/28/2015   Peripheral neuropathy    Pneumonia    Rhabdomyolysis    S/P CABG x 2 03/29/2015   LIMA to Diagonal, SVG to OM, EVH via right thigh   Stroke ([place])    L patietal with small scattered lacunar infarcts   Thrombocytopenia ([place]) 03/21/2015   Venous insufficiency    Weakness generalized 03/21/2015    Past Surgical History:  Procedure Laterality Date   BACK SURGERY  2002   lumbosacral. 11 back surgeries total   BRONCHIAL BIOPSY  08/06/2020   Procedure: BRONCHIAL BIOPSIES;  Surgeon: Collene Gobble, MD;  Location: [facility] ENDOSCOPY;  Service: Pulmonary;;   BRONCHIAL BRUSHINGS  08/06/2020   Procedure: BRONCHIAL BRUSHINGS;  Surgeon: Collene Gobble, MD;  Location: [facility] ENDOSCOPY;  Service: Pulmonary;;   BRONCHIAL NEEDLE ASPIRATION BIOPSY  08/06/2020   Procedure: BRONCHIAL NEEDLE ASPIRATION BIOPSIES;  Surgeon: Collene Gobble, MD;  Location: [place] ENDOSCOPY;  Service: Pulmonary;;   CARDIOVERSION N/A 05/06/2021   Procedure: CARDIOVERSION;  Surgeon: Geralynn Rile, MD;  Location: [place];  Service: Cardiovascular;  Laterality: N/A;   Carotid Doppler  03/2013   bilat bulb/prox ICAs - mild amount of fibrous plaque with no evidence of diameter reduction   CARPAL TUNNEL RELEASE Bilateral 08/09/2014   Procedure: BILATERAL CARPAL TUNNEL RELEASE;  Surgeon: Daryll Brod, MD;  Location: [place]  [facility];  Service: Orthopedics;  Laterality: Bilateral;  ANESTHESIA:  IV REGIONAL BIL FAB   CHOLECYSTECTOMY     COLONOSCOPY     CORONARY ANGIOPLASTY  10/13/1996   CORONARY ANGIOPLASTY  09/21/1989   emergency PTCA   CORONARY ANGIOPLASTY  10/13/1996   Multi-Link diagonal & OD stenting (Dr. Marella Chimes)    [place]  12/03/1997   disease of mid DX-1 ~50% & in mid PLA & PDA (distal lesions) (Dr. Marella Chimes)    CORONARY ANGIOPLASTY  10/14/1999   progression of disease distal PLA & PDA; progression of disease prox RCA - moderate (Dr. Marella Chimes)    CORONARY ANGIOPLASTY WITH STENT PLACEMENT  04/04/2004   4.0x2m non-DES (thrombectomy via AngioJet) to RCA for high grade stenosis (Dr. RMarella Chimes   CORONARY ARTERY BYPASS GRAFT N/A 03/29/2015   Procedure: CORONARY ARTERY BYPASS GRAFTING TIMES TWO USING LEFT INTERNAL MAMMARY ARTERY AND RIGHT LEG GREATER SAPHENOUS VEIN HARVESTED ENDOSCOPICALLY.;  Surgeon: CRexene Alberts MD;  [place]  Service: Open Heart Surgery;  Laterality: N/A;   ESOPHAGOGASTRODUODENOSCOPY N/A 03/27/2015   Procedure: ESOPHAGOGASTRODUODENOSCOPY (EGD);  Surgeon: MClarene Essex MD;  [facility];  Service: Endoscopy;  Laterality: N/A;  possible dilation   ESOPHAGOGASTRODUODENOSCOPY N/A 10/05/2020   Procedure: ESOPHAGOGASTRODUODENOSCOPY (EGD);  Surgeon: OArta Silence MD;  [facility];  Service: Endoscopy;  Laterality: N/A;   EYE SURGERY Right    macular fold-02/2021   FINE NEEDLE ASPIRATION  08/06/2020   Procedure: FINE NEEDLE ASPIRATION (FNA) LINEAR;  Surgeon: BCollene Gobble MD;  [facility];  Service: Pulmonary;;   IR IMAGING GUIDED PORT INSERTION  10/07/2020   LEFT HEART CATHETERIZATION WITH CORONARY ANGIOGRAM N/A 03/28/2015   Procedure: LEFT HEART CATHETERIZATION WITH CORONARY ANGIOGRAM;  Surgeon: Peter M JMartinique MD;  [facility];  Service: Cardiovascular;  Laterality: N/A;   SINUS ENDO W/FUSION     TEE WITHOUT CARDIOVERSION N/A 05/06/2021   Procedure: TRANSESOPHAGEAL ECHOCARDIOGRAM (TEE);  Surgeon: OGeralynn Rile MD;  [facility]  Service: Cardiovascular;  Laterality: N/A;   TONSILLECTOMY     TRANSTHORACIC ECHOCARDIOGRAM  08/08/2013   EF 55-60%, mild conc hypertrophy, grade 1 diastolic  dysfunction; AV with mild stenosis; LA & RA mildly dilated   VIDEO BRONCHOSCOPY WITH ENDOBRONCHIAL NAVIGATION N/A 08/06/2020   Procedure: VIDEO BRONCHOSCOPY WITH ENDOBRONCHIAL NAVIGATION;  Surgeon: BCollene Gobble MD;  [facility];  Service: Pulmonary;  Laterality: N/A;   VIDEO BRONCHOSCOPY WITH ENDOBRONCHIAL ULTRASOUND N/A 08/06/2020   Procedure: VIDEO BRONCHOSCOPY WITH ENDOBRONCHIAL ULTRASOUND;  Surgeon: BCollene Gobble MD;  [facility];  Service: Pulmonary;  Laterality: N/A;    FAMHx:  Family History  Problem Relation Age of Onset   Heart disease Mother    Coronary artery disease Father    Cancer Maternal Grandmother    Heart Problems Maternal Grandfather    Diabetes Son        borderline     SOCHx:   reports that he quit smoking about 47 years ago. His smoking use included cigarettes. He has a 40.00 pack-year smoking history. He has never used smokeless tobacco. He reports that he does not drink alcohol and does not use drugs.  ALLERGIES:  Allergies  Allergen Reactions   Actos [Pioglitazone] Swelling   Spironolactone Itching   Metformin And Related Nausea Only    3.16.2022 pt is currently taking this medication at home   Niaspan [Niacin Er] Itching and Rash   Wound Dressing Adhesive Rash  ROS: Pertinent items noted in HPI and remainder of comprehensive ROS otherwise negative.  HOME MEDS: Current Outpatient Medications  Medication Sig Dispense Refill   allopurinol (ZYLOPRIM) 100 MG tablet TAKE ONE TABLET EVERY DAY 30 tablet 6   amitriptyline (ELAVIL) 50 MG tablet Take 50 mg by mouth at bedtime.     amLODipine (NORVASC) 10 MG tablet TAKE ONE TABLET BY MOUTH ONCE DAILY 90 tablet 3   apixaban (ELIQUIS) 5 MG TABS tablet Take 1 tablet (5 mg total) by mouth 2 (two) times daily. 60 tablet 11   Blood Glucose Monitoring Suppl (CONTOUR NEXT MONITOR) w/Device KIT 1 Device by Does not apply route 3 (three) times daily. Use to check blood sugars 3 times  daily. Dx Code E13.9 1 kit 2   colchicine 0.6 MG tablet Take 0.6 mg by mouth 3 (three) times daily as needed.     CONTOUR NEXT TEST test strip USE 1 STRIP TO CHECK GLUCOSE 4 TIMES DAILY 300 each 1   hydrALAZINE (APRESOLINE) 25 MG tablet Take 1.5 tablets (37.5 mg total) by mouth in the morning and at bedtime. 270 tablet 3   insulin degludec (TRESIBA FLEXTOUCH) 200 UNIT/ML FlexTouch Pen Inject 12 Units into the skin at bedtime.     insulin lispro (HUMALOG) 100 UNIT/ML cartridge Inject 5-15 Units into the skin 3 (three) times daily with meals. Sliding scale.     Insulin Pen Needle (EASY COMFORT PEN NEEDLES) 33G X 4 MM MISC 1 each by Does not apply route See admin instructions. Use to inject insulin 5 times daily. 150 each 3   isosorbide dinitrate (ISORDIL) 20 MG tablet Take 1 tablet (20 mg total) by mouth in the morning and at bedtime. 180 tablet 3   levothyroxine (SYNTHROID, LEVOTHROID) 75 MCG tablet Take 75 mcg by mouth daily before breakfast.   1   lidocaine-prilocaine (EMLA) cream APPLY A TEASPOON OVER PORT SITE AT LEASTONE HOUR BEFORE LAB APPOINTMENT. DO NOT RUB IN AND COVER WITH PLASTIC WRAP (Patient taking differently: Apply 1 application topically See admin instructions. Apply a teaspoon over port site at least one hour before lab appointment. Do not rub in and cover with plastic wrap.) 30 g 0   lisinopril (ZESTRIL) 10 MG tablet Take 1 tablet (10 mg total) by mouth daily. 90 tablet 3   metFORMIN (GLUCOPHAGE-XR) 750 MG 24 hr tablet TAKE 1 TABLET WITH DINNER 90 tablet 1   ondansetron (ZOFRAN) 4 MG tablet Take 1 tablet (4 mg total) by mouth every 6 (six) hours as needed for nausea. 20 tablet 0   OXYGEN Inhale 2 L into the lungs. At bedtime and during the day prn     potassium chloride SA (K-DUR,KLOR-CON) 20 MEQ tablet Take 20 mEq by mouth 2 (two) times daily.      prochlorperazine (COMPAZINE) 10 MG tablet Take 1 tablet (10 mg total) by mouth every 6 (six) hours as needed for nausea or vomiting. 30  tablet 0   rosuvastatin (CRESTOR) 20 MG tablet Take 20 mg by mouth daily.     sotorasib 120 MG TABS TAKE 960 MG BY MOUTH EVERY MORNING. (Patient taking differently: Take 960 mg by mouth daily.) 240 tablet 3   Tamsulosin HCl (FLOMAX) 0.4 MG CAPS Take 0.4 mg by mouth at bedtime.     vitamin B-12 (CYANOCOBALAMIN) 1000 MCG tablet Take 1,000 mcg by mouth at bedtime.     furosemide (LASIX) 40 MG tablet Take 2 tablets (80 mg total) by mouth 2 (two) times daily.  180 tablet 1   No current facility-administered medications for this visit.    LABS/IMAGING: No results found for this or any previous visit (from the past 48 hour(s)). No results found.  VITALS: BP (!) 132/58   Pulse 69   Ht _0  (1.702 m)   Wt 236 lb (107 kg)   SpO2 (!) 85%   BMI 36.96 kg/m   EXAM: General appearance: alert, no distress and in wheelchair Neck: JVD - 3 cm above sternal notch, no carotid bruit and thyroid not enlarged, symmetric, no tenderness/mass/nodules Lungs: diminished breath sounds bibasilar and rales bibasilar Heart: irregularly irregular rhythm Abdomen: soft, non-tender; bowel sounds normal; no masses,  no organomegaly Extremities: edema 2-3+ RLE and LLE in a brace Pulses: 2+ and symmetric Skin: Skin color, texture, turgor normal. No rashes or lesions Neurologic: Grossly normal Psych: Pleasant  EKG: Deferred  ASSESSMENT: Acute on chronic combined systolic and diastolic congestive heart failure, LVEF 45 to 50% (04/2021) Paroxysmal atrial fibrillation/flutter - CHADSVASC score of 6 Lung Cancer Recent multifocal pneumonia Coronary artery disease status post BMS to the RCA in 2005, recent CABG 2 (LIMA to LAD - for critical left main disease, SVG to OM1)- 2016 Obesity Hypertension-controlled Dyslipidemia Prior stroke Diabetic neuropathy Diabetic nephropathy - left foot drop Type 1 diabetes on insulin LE edema Mild aortic stenosis LVEF 48% with mild to moderate AS (03/2020)  PLAN: 1.   Mr.  Wilkins has had further weight gain now more than 10 pounds over his hospital discharge weight.  Creatinine is elevated with stage IIIb CKD.  He unfortunately did not tolerate metolazone.  Will recommend rather increasing his Lasix further to 80 mg twice daily.  We will repeat metabolic profile and BNP in about 3 weeks and follow-up in 1 to 2 months.  Pixie Casino, MD, [facility], [place] Director of the Advanced Lipid Disorders &  Cardiovascular Risk Reduction Clinic Diplomate of the American Board of Clinical Lipidology Attending Cardiologist  Direct Dial: [phone]  Fax: [phone]  Website:  [email or web address] 07/01/2021, 3:06 PM

## 2021-07-08 DIAGNOSIS — C3491 Malignant neoplasm of unspecified part of right bronchus or lung: Secondary | ICD-10-CM | POA: Diagnosis not present

## 2021-07-09 ENCOUNTER — Other Ambulatory Visit: Payer: Self-pay

## 2021-07-09 ENCOUNTER — Ambulatory Visit (INDEPENDENT_AMBULATORY_CARE_PROVIDER_SITE_OTHER): Payer: Medicare Other | Admitting: Endocrinology

## 2021-07-09 ENCOUNTER — Encounter: Payer: Self-pay | Admitting: Endocrinology

## 2021-07-09 VITALS — BP 130/60 | HR 63 | Ht 67.0 in | Wt 229.4 lb

## 2021-07-09 DIAGNOSIS — E1165 Type 2 diabetes mellitus with hyperglycemia: Secondary | ICD-10-CM | POA: Diagnosis not present

## 2021-07-09 DIAGNOSIS — Z794 Long term (current) use of insulin: Secondary | ICD-10-CM | POA: Diagnosis not present

## 2021-07-09 NOTE — Progress Notes (Signed)
Patient ID: Randall Wilkins, male   DOB: December 19, 1940, 80 y.o.   MRN: [id number]           Reason for Appointment: Follow-up for Type 2 Diabetes   History of Present Illness:          Date of diagnosis of type 2 diabetes mellitus:   1998       Background history:   Has been on insulin since about 2001 He thinks he was given metformin only for a month and may have stopped because of renal dysfunction Also Actos was tried and not clear what side effects he had Does not remember other medication he may have tried but has not tried Victoza or Byetta   Recent history:    INSULIN regimen is:   [place] 10 units at 8 PM .  Humalog 2 to 7 units before meals  Non-insulin hypoglycemic drugs the patient is taking are:  Metformin 750 mg daily  His A1c is 6 most recently  Current management, blood sugar patterns and problems identified:  His wife is again monitoring his sugars and giving him insulin doses  Insulin requirement has decreased further This appears to be related to recent weight loss and likely overall decreased intake She is now giving him only 10 units of Tresiba instead of 12 in the last 3 to 4 days Despite this his fasting blood sugar today was only 71 Otherwise his blood sugars are looking fairly normal His appetite is quite variable at different meals and also his carbohydrate intake Only once when he did not take any lunchtime Humalog insulin and did he have a reading of 181 at dinnertime He does not always exhibit symptoms of low sugars Not clear if he was able to get coverage for the Dexcom, previously freestyle libre was not accurate   Glucose monitoring:  Checking blood sugars 3-4 times a day       Glucometer:  Libre/contour   Data for the last 4 weeks from meter   PRE-MEAL Fasting Lunch Dinner Bedtime Overall  Glucose range: 47-90 60-131 93-181 ?   Mean/median:     ?   POST-MEAL PC Breakfast PC Lunch PC Dinner  Glucose range:   ?  Mean/median:       Previous data:   PRE-MEAL Fasting Lunch Dinner Bedtime Overall  Glucose range:  80-151     69-198  Averages:  115  166  126  132  132       Self-care:  Meal times are:  Breakfast is at 8 a.m., lunch: 12 in the last 3 to 4 days noon Dinner: 5-6 PM   Typical meal intake: Breakfast is sometimes doughnuts or sometimes egg sandwich   lunch is a sandwich or cottage cheese/food, dinner is meat and vegetables/solid    Snacks: Sugar-free Jell-O or pudding, crackers          Dietician visit, most recent: 12/2017               CDE 8/18  Exercise:  unable to do any, using walker for ambulation  Weight history: Range 252-280 previously  Wt Readings from Last 3 Encounters:  07/09/21 229 lb 6.4 oz (104.1 kg)  07/01/21 236 lb (107 kg)  06/19/21 238 lb 6.4 oz (108.1 kg)    Glycemic control:   Lab Results  Component Value Date   HGBA1C 6.0 (H) 05/02/2021   HGBA1C 6.7 (H) 02/26/2021   HGBA1C 6.4 01/06/2021   Lab Results  Component Value  Date   MICROALBUR 35.5 (H) 02/12/2020   LDLCALC 36 01/06/2021   CREATININE 1.73 (H) 06/17/2021   Lab Results  Component Value Date   MICRALBCREAT 52.7 (H) 02/12/2020    Lab Results  Component Value Date   FRUCTOSAMINE 252 12/17/2017   FRUCTOSAMINE 233 08/09/2017      Allergies as of 07/09/2021       Reactions   Actos [pioglitazone] Swelling   Spironolactone Itching   Metformin And Related Nausea Only   3.16.2022 pt is currently taking this medication at home   Niaspan [niacin Er] Itching, Rash   Wound Dressing Adhesive Rash        Medication List        Accurate as of July 09, 2021  4:00 PM. If you have any questions, ask your nurse or doctor.          allopurinol 100 MG tablet Commonly known as: ZYLOPRIM TAKE ONE TABLET EVERY DAY   amitriptyline 50 MG tablet Commonly known as: ELAVIL Take 50 mg by mouth at bedtime.   amLODipine 10 MG tablet Commonly known as: NORVASC TAKE ONE TABLET BY MOUTH ONCE DAILY    apixaban 5 MG Tabs tablet Commonly known as: Eliquis Take 1 tablet (5 mg total) by mouth 2 (two) times daily.   colchicine 0.6 MG tablet Take 0.6 mg by mouth 3 (three) times daily as needed.   Contour Next Monitor w/Device Kit 1 Device by Does not apply route 3 (three) times daily. Use to check blood sugars 3 times daily. Dx Code E13.9   Contour Next Test test strip Generic drug: glucose blood USE 1 STRIP TO CHECK GLUCOSE 4 TIMES DAILY   Easy Comfort Pen Needles 33G X 4 MM Misc Generic drug: Insulin Pen Needle 1 each by Does not apply route See admin instructions. Use to inject insulin 5 times daily.   furosemide 40 MG tablet Commonly known as: LASIX Take 2 tablets (80 mg total) by mouth 2 (two) times daily.   HumaLOG 100 UNIT/ML cartridge Generic drug: insulin lispro Inject 5-15 Units into the skin 3 (three) times daily with meals. Sliding scale.   hydrALAZINE 25 MG tablet Commonly known as: APRESOLINE Take 1.5 tablets (37.5 mg total) by mouth in the morning and at bedtime.   isosorbide dinitrate 20 MG tablet Commonly known as: ISORDIL Take 1 tablet (20 mg total) by mouth in the morning and at bedtime.   levothyroxine 75 MCG tablet Commonly known as: SYNTHROID Take 75 mcg by mouth daily before breakfast.   lidocaine-prilocaine cream Commonly known as: EMLA APPLY A TEASPOON OVER PORT SITE AT LEASTONE HOUR BEFORE LAB APPOINTMENT. DO NOT RUB IN AND COVER WITH PLASTIC WRAP What changed: See the new instructions.   lisinopril 10 MG tablet Commonly known as: ZESTRIL Take 1 tablet (10 mg total) by mouth daily.   Lumakras 120 MG Tabs Generic drug: sotorasib TAKE 960 MG BY MOUTH EVERY MORNING. What changed:  how much to take how to take this when to take this   metFORMIN 750 MG 24 hr tablet Commonly known as: GLUCOPHAGE-XR TAKE 1 TABLET WITH DINNER   ondansetron 4 MG tablet Commonly known as: ZOFRAN Take 1 tablet (4 mg total) by mouth every 6 (six) hours as  needed for nausea.   OXYGEN Inhale 2 L into the lungs. At bedtime and during the day prn   potassium chloride SA 20 MEQ tablet Commonly known as: KLOR-CON Take 20 mEq by mouth 2 (two) times daily.  prochlorperazine 10 MG tablet Commonly known as: COMPAZINE Take 1 tablet (10 mg total) by mouth every 6 (six) hours as needed for nausea or vomiting.   rosuvastatin 20 MG tablet Commonly known as: CRESTOR Take 20 mg by mouth daily.   tamsulosin 0.4 MG Caps capsule Commonly known as: FLOMAX Take 0.4 mg by mouth at bedtime.   Tyler Aas FlexTouch 200 UNIT/ML FlexTouch Pen Generic drug: insulin degludec Inject 12 Units into the skin at bedtime. What changed: how much to take   vitamin B-12 1000 MCG tablet Commonly known as: CYANOCOBALAMIN Take 1,000 mcg by mouth at bedtime.        Allergies:  Allergies  Allergen Reactions   Actos [Pioglitazone] Swelling   Spironolactone Itching   Metformin And Related Nausea Only    3.16.2022 pt is currently taking this medication at home   Niaspan [Niacin Er] Itching and Rash   Wound Dressing Adhesive Rash    Past Medical History:  Diagnosis Date   Aortic stenosis, mild 11/14/2013   Aortic valve sclerosis 03/29/2015   Bilateral leg edema 05/21/2014   CAD (coronary artery disease)    Chronic diastolic congestive heart failure (HCC)    Chronic kidney disease (CKD), stage III (moderate) (HCC)    Diabetes 1.5, managed as type 1 ([place]) 02/04/2013   Diabetic peripheral neuropathy associated with type 1 diabetes mellitus ([place]) 11/14/2013   Dysphagia    Dyspnea on exertion 03/21/2015   Exogenous obesity    Gout    Heart attack ([place])    History of nuclear stress test 07/2011   dipyridamole; fixed inferolateral defect, worse at stress than rest; no reversible ischemia; low risk scan    Hyperlipidemia    Hypertension    Hypothyroidism    Insulin dependent diabetes mellitus    Left foot drop    Left main coronary artery disease 03/28/2015   lung ca  dx'd 08/2020   Memory loss    Obesity (BMI 30-39.9) 11/14/2013   Obstructive sleep apnea 03/21/2015   OSA on CPAP    uses a cpap   Peptic ulcer with hemorrhage 03/28/2015   Peripheral neuropathy    Pneumonia    Rhabdomyolysis    S/P CABG x 2 03/29/2015   LIMA to Diagonal, SVG to OM, EVH via right thigh   Stroke ([place])    L patietal with small scattered lacunar infarcts   Thrombocytopenia ([place]) 03/21/2015   Venous insufficiency    Weakness generalized 03/21/2015    Past Surgical History:  Procedure Laterality Date   BACK SURGERY  2002   lumbosacral. 11 back surgeries total   BRONCHIAL BIOPSY  08/06/2020   Procedure: BRONCHIAL BIOPSIES;  Surgeon: Collene Gobble, MD;  Location: [facility] ENDOSCOPY;  Service: Pulmonary;;   BRONCHIAL BRUSHINGS  08/06/2020   Procedure: BRONCHIAL BRUSHINGS;  Surgeon: Collene Gobble, MD;  Location: [place];  Service: Pulmonary;;   BRONCHIAL NEEDLE ASPIRATION BIOPSY  08/06/2020   Procedure: BRONCHIAL NEEDLE ASPIRATION BIOPSIES;  Surgeon: Collene Gobble, MD;  Location: [place];  Service: Pulmonary;;   CARDIOVERSION N/A 05/06/2021   Procedure: CARDIOVERSION;  Surgeon: Geralynn Rile, MD;  Location: [place];  Service: Cardiovascular;  Laterality: N/A;   Carotid Doppler  03/2013   bilat bulb/prox ICAs - mild amount of fibrous plaque with no evidence of diameter reduction   CARPAL TUNNEL RELEASE Bilateral 08/09/2014   Procedure: BILATERAL CARPAL TUNNEL RELEASE;  Surgeon: Daryll Brod, MD;  Location: [place];  Service: Orthopedics;  Laterality:  Bilateral;  ANESTHESIA:  IV REGIONAL BIL FAB   CHOLECYSTECTOMY     COLONOSCOPY     CORONARY ANGIOPLASTY  10/13/1996   CORONARY ANGIOPLASTY  09/21/1989   emergency PTCA   CORONARY ANGIOPLASTY  10/13/1996   Multi-Link diagonal & OD stenting (Dr. Marella Chimes)   [place]  12/03/1997   disease of mid DX-1 ~50% & in mid PLA & PDA (distal lesions) (Dr. Marella Chimes)    CORONARY  ANGIOPLASTY  10/14/1999   progression of disease distal PLA & PDA; progression of disease prox RCA - moderate (Dr. Marella Chimes)    CORONARY ANGIOPLASTY WITH STENT PLACEMENT  04/04/2004   4.0x2m non-DES (thrombectomy via AngioJet) to RCA for high grade stenosis (Dr. RMarella Chimes   CORONARY ARTERY BYPASS GRAFT N/A 03/29/2015   Procedure: CORONARY ARTERY BYPASS GRAFTING TIMES TWO USING LEFT INTERNAL MAMMARY ARTERY AND RIGHT LEG GREATER SAPHENOUS VEIN HARVESTED ENDOSCOPICALLY.;  Surgeon: CRexene Alberts MD;  [place] [facility]  Service: Open Heart Surgery;  Laterality: N/A;   ESOPHAGOGASTRODUODENOSCOPY N/A 03/27/2015   Procedure: ESOPHAGOGASTRODUODENOSCOPY (EGD);  Surgeon: MClarene Essex MD;  [facility];  Service: Endoscopy;  Laterality: N/A;  possible dilation   ESOPHAGOGASTRODUODENOSCOPY N/A 10/05/2020   Procedure: ESOPHAGOGASTRODUODENOSCOPY (EGD);  Surgeon: OArta Silence MD;  [facility];  Service: Endoscopy;  Laterality: N/A;   EYE SURGERY Right    macular fold-02/2021   FINE NEEDLE ASPIRATION  08/06/2020   Procedure: FINE NEEDLE ASPIRATION (FNA) LINEAR;  Surgeon: BCollene Gobble MD;  [facility];  Service: Pulmonary;;   IR IMAGING GUIDED PORT INSERTION  10/07/2020   LEFT HEART CATHETERIZATION WITH CORONARY ANGIOGRAM N/A 03/28/2015   Procedure: LEFT HEART CATHETERIZATION WITH CORONARY ANGIOGRAM;  Surgeon: Peter M JMartinique MD;  [facility];  Service: Cardiovascular;  Laterality: N/A;   SINUS ENDO W/FUSION     TEE WITHOUT CARDIOVERSION N/A 05/06/2021   Procedure: TRANSESOPHAGEAL ECHOCARDIOGRAM (TEE);  Surgeon: OGeralynn Rile MD;  [place] [facility]  Service: Cardiovascular;  Laterality: N/A;   TONSILLECTOMY     TRANSTHORACIC ECHOCARDIOGRAM  08/08/2013   EF 55-60%, mild conc hypertrophy, grade 1 diastolic dysfunction; AV with mild stenosis; LA & RA mildly dilated   VIDEO BRONCHOSCOPY WITH ENDOBRONCHIAL NAVIGATION N/A 08/06/2020    Procedure: VIDEO BRONCHOSCOPY WITH ENDOBRONCHIAL NAVIGATION;  Surgeon: BCollene Gobble MD;  [facility];  Service: Pulmonary;  Laterality: N/A;   VIDEO BRONCHOSCOPY WITH ENDOBRONCHIAL ULTRASOUND N/A 08/06/2020   Procedure: VIDEO BRONCHOSCOPY WITH ENDOBRONCHIAL ULTRASOUND;  Surgeon: BCollene Gobble MD;  [facility];  Service: Pulmonary;  Laterality: N/A;    Family History  Problem Relation Age of Onset   Heart disease Mother    Coronary artery disease Father    Cancer Maternal Grandmother    Heart Problems Maternal Grandfather    Diabetes Son        borderline     Social History:  reports that he quit smoking about 47 years ago. His smoking use included cigarettes. He has a 40.00 pack-year smoking history. He has never used smokeless tobacco. He reports that he does not drink alcohol and does not use drugs.   Review of Systems   Lipid history: LDL controlled on 20 mg rosuvastatin, followed by PCP  Has history of CAD    Lab Results  Component Value Date   CHOL 94 01/06/2021   HDL 36.20 (L) 01/06/2021   LDLCALC 36 01/06/2021   TRIG 109.0 01/06/2021   CHOLHDL 3 01/06/2021  Hypertension: Mild and treated with 10 mg lisinopril has been followed by PCP  BP Readings from Last 3 Encounters:  07/09/21 130/60  07/01/21 (!) 132/58  06/19/21 (!) 122/52     CKD: His creatinine is somewhat inconsistent  His last microalbumin ratio was 53  Lab Results  Component Value Date   CREATININE 1.73 (H) 06/17/2021   CREATININE 1.89 (H) 06/11/2021   CREATININE 1.46 (H) 05/20/2021    Most recent eye exam was in 2/20, Dr. Gershon Crane and reportedly no retinopathy   Complications of diabetes: Peripheral neuropathy with sensory loss.  Dementia: Followed by neurologist and is on Aricept     Physical Examination:  BP 130/60 (BP Location: Right Arm, Patient Position: Sitting, Cuff Size: Normal)   Pulse 63   Ht _0  (1.702 m)   Wt 229 lb 6.4 oz  (104.1 kg)   SpO2 91%   BMI 35.93 kg/m       ASSESSMENT:  Diabetes type 2, insulin requiring with obesity and history of neuropathy  See history of present illness for detailed discussion of current diabetes management, blood sugar patterns from his meter and freestyle [employer] and problems identified  His A1c is 6% about 2 months ago and previously was 6.7. Currently on low doses of basal bolus insulin and low-dose metformin   Recently his blood sugars are lower, lowest reading 47 in the morning and he is also losing weight  Considering his age and comorbid conditions with his lung cancer his blood sugar targets need to be relaxed Also with his renal insufficiency unable to prescribe much metformin and may be better to stop this since renal function may worsen  PLAN:    STOP all diabetes medications  He will start Tyler Aas only if morning sugars stays consistently over 150 and only with 6 units  Humalog to be given only if blood sugars start going up over 200 after meals and can inject as before 2 to 6 units based on meal size right after eating His wife will call if she has any questions Also can cut back on glucose monitoring to once or twice a day instead of 3 times a day  Patient Instructions  No Tyler Aas or metformin  If am sugar stays >150 then try 6 units Sylvie Farrier to have sugars upto 200 after meals         Elayne Snare 07/09/2021, 4:00 PM   Note: This office note was prepared with Dragon voice recognition system technology. Any transcriptional errors that result from this process are unintentional.

## 2021-07-09 NOTE — Patient Instructions (Addendum)
No Tresiba or metformin  If am sugar stays >150 then try 6 units Tyler Aas  Ok to have sugars upto 200 after meals

## 2021-07-10 ENCOUNTER — Ambulatory Visit: Payer: Medicare Other | Admitting: Endocrinology

## 2021-07-16 ENCOUNTER — Encounter (INDEPENDENT_AMBULATORY_CARE_PROVIDER_SITE_OTHER): Payer: Self-pay | Admitting: Ophthalmology

## 2021-07-16 ENCOUNTER — Other Ambulatory Visit: Payer: Self-pay

## 2021-07-16 ENCOUNTER — Ambulatory Visit (INDEPENDENT_AMBULATORY_CARE_PROVIDER_SITE_OTHER): Payer: Medicare Other | Admitting: Ophthalmology

## 2021-07-16 DIAGNOSIS — H35351 Cystoid macular degeneration, right eye: Secondary | ICD-10-CM | POA: Diagnosis not present

## 2021-07-16 DIAGNOSIS — H35371 Puckering of macula, right eye: Secondary | ICD-10-CM | POA: Diagnosis not present

## 2021-07-16 DIAGNOSIS — H35372 Puckering of macula, left eye: Secondary | ICD-10-CM

## 2021-07-16 NOTE — Assessment & Plan Note (Signed)
CME OD has in fact diminished as patient has stopped compression and rubbing of the eye due to allergic type symptoms.  The topical therapy has improved the CME just spontaneously with cessation of compression of the eye.

## 2021-07-16 NOTE — Assessment & Plan Note (Signed)
Condition resolved post vitrectomy

## 2021-07-16 NOTE — Assessment & Plan Note (Signed)
Minor epiretinal membrane on foveal distorting with good acuity observe

## 2021-07-16 NOTE — Progress Notes (Signed)
07/16/2021     CHIEF COMPLAINT Patient presents for Retina Follow Up (8 week fu OD and OCT/Pt states VA OU stable since last visit. Pt denies FOL, floaters, or ocular pain OU. /A1C:6.4/LBS:118/)   HISTORY OF PRESENT ILLNESS: Randall Wilkins is a 80 y.o. male who presents to the clinic today for:   HPI     Retina Follow Up           Diagnosis: Other   Laterality: both eyes   Onset: 8 weeks ago   Severity: mild   Duration: 8 weeks   Course: stable   Comments: 8 week fu OD and OCT Pt states VA OU stable since last visit. Pt denies FOL, floaters, or ocular pain OU.  A1C:6.4 LBS:118        Last edited by Kendra Opitz, COA on 07/16/2021 10:02 AM.      Referring physician: [name], MD [address],  [place] [address]  HISTORICAL INFORMATION:   Selected notes from the MEDICAL RECORD NUMBER    Lab Results  Component Value Date   HGBA1C 6.0 (H) 05/02/2021     CURRENT MEDICATIONS: No current outpatient medications on file. (Ophthalmic Drugs)   No current facility-administered medications for this visit. (Ophthalmic Drugs)   Current Outpatient Medications (Other)  Medication Sig   allopurinol (ZYLOPRIM) 100 MG tablet TAKE ONE TABLET EVERY DAY   amitriptyline (ELAVIL) 50 MG tablet Take 50 mg by mouth at bedtime.   amLODipine (NORVASC) 10 MG tablet TAKE ONE TABLET BY MOUTH ONCE DAILY   apixaban (ELIQUIS) 5 MG TABS tablet Take 1 tablet (5 mg total) by mouth 2 (two) times daily.   Blood Glucose Monitoring Suppl (CONTOUR NEXT MONITOR) w/Device KIT 1 Device by Does not apply route 3 (three) times daily. Use to check blood sugars 3 times daily. Dx Code E13.9   colchicine 0.6 MG tablet Take 0.6 mg by mouth 3 (three) times daily as needed.   CONTOUR NEXT TEST test strip USE 1 STRIP TO CHECK GLUCOSE 4 TIMES DAILY   furosemide (LASIX) 40 MG tablet Take 2 tablets (80 mg total) by mouth 2 (two) times daily.   hydrALAZINE (APRESOLINE) 25 MG tablet Take  1.5 tablets (37.5 mg total) by mouth in the morning and at bedtime.   insulin degludec (TRESIBA FLEXTOUCH) 200 UNIT/ML FlexTouch Pen Inject 12 Units into the skin at bedtime. (Patient taking differently: Inject 10-12 Units into the skin at bedtime.)   insulin lispro (HUMALOG) 100 UNIT/ML cartridge Inject 5-15 Units into the skin 3 (three) times daily with meals. Sliding scale.   Insulin Pen Needle (EASY COMFORT PEN NEEDLES) 33G X 4 MM MISC 1 each by Does not apply route See admin instructions. Use to inject insulin 5 times daily.   isosorbide dinitrate (ISORDIL) 20 MG tablet Take 1 tablet (20 mg total) by mouth in the morning and at bedtime.   levothyroxine (SYNTHROID, LEVOTHROID) 75 MCG tablet Take 75 mcg by mouth daily before breakfast.    lidocaine-prilocaine (EMLA) cream APPLY A TEASPOON OVER PORT SITE AT LEASTONE HOUR BEFORE LAB APPOINTMENT. DO NOT RUB IN AND COVER WITH PLASTIC WRAP (Patient taking differently: Apply 1 application topically See admin instructions. Apply a teaspoon over port site at least one hour before lab appointment. Do not rub in and cover with plastic wrap.)   lisinopril (ZESTRIL) 10 MG tablet Take 1 tablet (10 mg total) by mouth daily.   metFORMIN (GLUCOPHAGE-XR) 750 MG 24  hr tablet TAKE 1 TABLET WITH DINNER   ondansetron (ZOFRAN) 4 MG tablet Take 1 tablet (4 mg total) by mouth every 6 (six) hours as needed for nausea.   OXYGEN Inhale 2 L into the lungs. At bedtime and during the day prn   potassium chloride SA (K-DUR,KLOR-CON) 20 MEQ tablet Take 20 mEq by mouth 2 (two) times daily.    prochlorperazine (COMPAZINE) 10 MG tablet Take 1 tablet (10 mg total) by mouth every 6 (six) hours as needed for nausea or vomiting.   rosuvastatin (CRESTOR) 20 MG tablet Take 20 mg by mouth daily.   sotorasib 120 MG TABS TAKE 960 MG BY MOUTH EVERY MORNING. (Patient taking differently: Take 960 mg by mouth daily.)   Tamsulosin HCl (FLOMAX) 0.4 MG CAPS Take 0.4 mg by mouth at bedtime.    vitamin B-12 (CYANOCOBALAMIN) 1000 MCG tablet Take 1,000 mcg by mouth at bedtime.   No current facility-administered medications for this visit. (Other)      REVIEW OF SYSTEMS:    ALLERGIES Allergies  Allergen Reactions   Actos [Pioglitazone] Swelling   Spironolactone Itching   Metformin And Related Nausea Only    3.16.2022 pt is currently taking this medication at home   Niaspan [Niacin Er] Itching and Rash   Wound Dressing Adhesive Rash    PAST MEDICAL HISTORY Past Medical History:  Diagnosis Date   Aortic stenosis, mild 11/14/2013   Aortic valve sclerosis 03/29/2015   Bilateral leg edema 05/21/2014   CAD (coronary artery disease)    Chronic diastolic congestive heart failure (HCC)    Chronic kidney disease (CKD), stage III (moderate) (HCC)    Diabetes 1.5, managed as type 1 ([place]) 02/04/2013   Diabetic peripheral neuropathy associated with type 1 diabetes mellitus ([place]) 11/14/2013   Dysphagia    Dyspnea on exertion 03/21/2015   Exogenous obesity    Gout    Heart attack ([place])    History of nuclear stress test 07/2011   dipyridamole; fixed inferolateral defect, worse at stress than rest; no reversible ischemia; low risk scan    Hyperlipidemia    Hypertension    Hypothyroidism    Insulin dependent diabetes mellitus    Left foot drop    Left main coronary artery disease 03/28/2015   lung ca dx'd 08/2020   Macular pucker, right eye 01/29/2021   Vitrectomy membrane peel right eye 02-12-2021   Memory loss    Obesity (BMI 30-39.9) 11/14/2013   Obstructive sleep apnea 03/21/2015   OSA on CPAP    uses a cpap   Peptic ulcer with hemorrhage 03/28/2015   Peripheral neuropathy    Pneumonia    Rhabdomyolysis    S/P CABG x 2 03/29/2015   LIMA to Diagonal, SVG to OM, EVH via right thigh   Stroke ([place])    L patietal with small scattered lacunar infarcts   Thrombocytopenia ([place]) 03/21/2015   Venous insufficiency    Weakness generalized 03/21/2015   Past Surgical History:  Procedure  Laterality Date   BACK SURGERY  2002   lumbosacral. 11 back surgeries total   BRONCHIAL BIOPSY  08/06/2020   Procedure: BRONCHIAL BIOPSIES;  Surgeon: Collene Gobble, MD;  Location[facility] ENDOSCOPY;  Service: Pulmonary;;   BRONCHIAL BRUSHINGS  08/06/2020   Procedure: BRONCHIAL BRUSHINGS;  Surgeon: Collene Gobble, MD;  Location: [place];  Service: Pulmonary;;   BRONCHIAL NEEDLE ASPIRATION BIOPSY  08/06/2020   Procedure: BRONCHIAL NEEDLE ASPIRATION BIOPSIES;  Surgeon: Collene Gobble, MD;  Location: [facility]  ENDOSCOPY;  Service: Pulmonary;;   CARDIOVERSION N/A 05/06/2021   Procedure: CARDIOVERSION;  Surgeon: Geralynn Rile, MD;  Location: [place];  Service: Cardiovascular;  Laterality: N/A;   Carotid Doppler  03/2013   bilat bulb/prox ICAs - mild amount of fibrous plaque with no evidence of diameter reduction   CARPAL TUNNEL RELEASE Bilateral 08/09/2014   Procedure: BILATERAL CARPAL TUNNEL RELEASE;  Surgeon: Daryll Brod, MD;  Location: [place];  Service: Orthopedics;  Laterality: Bilateral;  ANESTHESIA:  IV REGIONAL BIL FAB   CHOLECYSTECTOMY     COLONOSCOPY     CORONARY ANGIOPLASTY  10/13/1996   CORONARY ANGIOPLASTY  09/21/1989   emergency PTCA   CORONARY ANGIOPLASTY  10/13/1996   Multi-Link diagonal & OD stenting (Dr. Marella Chimes)   [place]  12/03/1997   disease of mid DX-1 ~50% & in mid PLA & PDA (distal lesions) (Dr. Marella Chimes)    CORONARY ANGIOPLASTY  10/14/1999   progression of disease distal PLA & PDA; progression of disease prox RCA - moderate (Dr. Marella Chimes)    CORONARY ANGIOPLASTY WITH STENT PLACEMENT  04/04/2004   4.0x52m non-DES (thrombectomy via AngioJet) to RCA for high grade stenosis (Dr. RMarella Chimes   CORONARY ARTERY BYPASS GRAFT N/A 03/29/2015   Procedure: CORONARY ARTERY BYPASS GRAFTING TIMES TWO USING LEFT INTERNAL MAMMARY ARTERY AND RIGHT LEG GREATER SAPHENOUS VEIN HARVESTED ENDOSCOPICALLY.;  Surgeon: CRexene Alberts MD;   [facility]  Service: Open Heart Surgery;  Laterality: N/A;   ESOPHAGOGASTRODUODENOSCOPY N/A 03/27/2015   Procedure: ESOPHAGOGASTRODUODENOSCOPY (EGD);  Surgeon: MClarene Essex MD;  [facility];  Service: Endoscopy;  Laterality: N/A;  possible dilation   ESOPHAGOGASTRODUODENOSCOPY N/A 10/05/2020   Procedure: ESOPHAGOGASTRODUODENOSCOPY (EGD);  Surgeon: OArta Silence MD;  [facility];  Service: Endoscopy;  Laterality: N/A;   EYE SURGERY Right    macular fold-02/2021   FINE NEEDLE ASPIRATION  08/06/2020   Procedure: FINE NEEDLE ASPIRATION (FNA) LINEAR;  Surgeon: BCollene Gobble MD;  [facility];  Service: Pulmonary;;   IR IMAGING GUIDED PORT INSERTION  10/07/2020   LEFT HEART CATHETERIZATION WITH CORONARY ANGIOGRAM N/A 03/28/2015   Procedure: LEFT HEART CATHETERIZATION WITH CORONARY ANGIOGRAM;  Surgeon: Peter M JMartinique MD;  [facility];  Service: Cardiovascular;  Laterality: N/A;   SINUS ENDO W/FUSION     TEE WITHOUT CARDIOVERSION N/A 05/06/2021   Procedure: TRANSESOPHAGEAL ECHOCARDIOGRAM (TEE);  Surgeon: OGeralynn Rile MD;  [place] [facility]  Service: Cardiovascular;  Laterality: N/A;   TONSILLECTOMY     TRANSTHORACIC ECHOCARDIOGRAM  08/08/2013   EF 55-60%, mild conc hypertrophy, grade 1 diastolic dysfunction; AV with mild stenosis; LA & RA mildly dilated   VIDEO BRONCHOSCOPY WITH ENDOBRONCHIAL NAVIGATION N/A 08/06/2020   Procedure: VIDEO BRONCHOSCOPY WITH ENDOBRONCHIAL NAVIGATION;  Surgeon: BCollene Gobble MD;  [facility];  Service: Pulmonary;  Laterality: N/A;   VIDEO BRONCHOSCOPY WITH ENDOBRONCHIAL ULTRASOUND N/A 08/06/2020   Procedure: VIDEO BRONCHOSCOPY WITH ENDOBRONCHIAL ULTRASOUND;  Surgeon: BCollene Gobble MD;  [facility];  Service: Pulmonary;  Laterality: N/A;    FAMILY HISTORY Family History  Problem Relation Age of Onset   Heart disease Mother    Coronary artery disease Father    Cancer  Maternal Grandmother    Heart Problems Maternal Grandfather    Diabetes Son        borderline     SOCIAL HISTORY Social History   Tobacco Use   Smoking status: Former    Packs/day: 2.00  Years: 20.00    Pack years: 40.00    Types: Cigarettes    Quit date: 01/25/1974    Years since quitting: 47.5   Smokeless tobacco: Never  Vaping Use   Vaping Use: Never used  Substance Use Topics   Alcohol use: No    Alcohol/week: 0.0 standard drinks   Drug use: No         OPHTHALMIC EXAM:  Base Eye Exam     Visual Acuity (ETDRS)       Right Left   Dist [place] 20/50 20/25 -1   Dist ph [place] 20/40 +2          Tonometry (Tonopen, 10:07 AM)       Right Left   Pressure 12 12         Pupils       Pupils Dark Light Shape React APD   Right PERRL 3 3 Round Minimal None   Left PERRL 3 3 Round Minimal None         Visual Fields (Counting fingers)       Left Right    Full Full         Extraocular Movement       Right Left    Full Full         Neuro/Psych     Oriented x3: Yes   Mood/Affect: Normal         Dilation     Both eyes: 1.0% Mydriacyl, 2.5% Phenylephrine @ 10:07 AM           Slit Lamp and Fundus Exam     External Exam       Right Left   External Normal Normal         Slit Lamp Exam       Right Left   Lids/Lashes Normal Normal   Conjunctiva/Sclera White and quiet White and quiet   Cornea Clear Clear   Anterior Chamber Deep and quiet Deep and quiet   Iris Round and reactive Round and reactive   Lens Centered posterior chamber intraocular lens Centered posterior chamber intraocular lens   Anterior Vitreous Normal Normal         Fundus Exam       Right Left   Posterior Vitreous Clear, avitric Posterior vitreous detachment   Disc Normal Normal   C/D Ratio 0.5 0.5   Macula No topographic distortion, no CME Early age related macular degeneration   Vessels Normal, no DR Normal, no DR   Periphery Normal Normal             IMAGING AND PROCEDURES  Imaging and Procedures for 07/16/21  OCT, Retina - OU - Both Eyes       Right Eye Quality was good. Scan locations included subfoveal. Central Foveal Thickness: 327. Progression has been stable.   Left Eye Quality was good. Scan locations included subfoveal. Central Foveal Thickness: 318. Progression has been stable. Findings include epiretinal membrane.   Notes CME also has improved now since patient has ceased decompression and mashing rubbing of the eye right eye.  Also improved post vitrectomy membrane.             ASSESSMENT/PLAN:  Cystoid macular degeneration of right eye CME OD has in fact diminished as patient has stopped compression and rubbing of the eye due to allergic type symptoms.  The topical therapy has improved the CME just spontaneously with cessation of compression of the eye.    Macular pucker, right  eye Condition resolved post vitrectomy  Macular pucker, left eye Minor epiretinal membrane on foveal distorting with good acuity observe     ICD-10-CM   1. Cystoid macular degeneration of right eye  H35.351 OCT, Retina - OU - Both Eyes    2. Macular pucker, left eye  H35.372 OCT, Retina - OU - Both Eyes    3. Macular pucker, right eye  H35.371       1.  OD, acuity much improved with resolution of CME spontaneous coincident with instruction to the patient to not compress mash or rub the pseudophakic eye because of the risk of iris chafe triggering inflammatory debris  2.  OS with minor epiretinal membrane continue to monitor and observe  3.  Ophthalmic Meds Ordered this visit:  No orders of the defined types were placed in this encounter.      Return in about 9 months (around 04/15/2022) for DILATE OU, OCT.  There are no Patient Instructions on file for this visit.   Explained the diagnoses, plan, and follow up with the patient and they expressed understanding.  Patient expressed understanding of the importance  of proper follow up care.   Clent Demark Sheranda Seabrooks M.D. Diseases & Surgery of the Retina and Vitreous Retina & Diabetic [place] 07/16/21     Abbreviations: M myopia (nearsighted); A astigmatism; H hyperopia (farsighted); P presbyopia; Mrx spectacle prescription;  CTL contact lenses; OD right eye; OS left eye; OU both eyes  XT exotropia; ET esotropia; PEK punctate epithelial keratitis; PEE punctate epithelial erosions; DES dry eye syndrome; MGD meibomian gland dysfunction; ATs artificial tears; PFAT's preservative free artificial tears; [place] nuclear sclerotic cataract; PSC posterior subcapsular cataract; ERM epi-retinal membrane; PVD posterior vitreous detachment; RD retinal detachment; DM diabetes mellitus; DR diabetic retinopathy; NPDR non-proliferative diabetic retinopathy; PDR proliferative diabetic retinopathy; CSME clinically significant macular edema; DME diabetic macular edema; dbh dot blot hemorrhages; CWS cotton wool spot; POAG primary open angle glaucoma; C/D cup-to-disc ratio; HVF humphrey visual field; GVF goldmann visual field; OCT optical coherence tomography; IOP intraocular pressure; BRVO Branch retinal vein occlusion; CRVO central retinal vein occlusion; CRAO central retinal artery occlusion; BRAO branch retinal artery occlusion; RT retinal tear; SB scleral buckle; PPV pars plana vitrectomy; VH Vitreous hemorrhage; PRP panretinal laser photocoagulation; IVK intravitreal kenalog; VMT vitreomacular traction; MH Macular hole;  NVD neovascularization of the disc; NVE neovascularization elsewhere; AREDS age related eye disease study; ARMD age related macular degeneration; POAG primary open angle glaucoma; EBMD epithelial/anterior basement membrane dystrophy; ACIOL anterior chamber intraocular lens; IOL intraocular lens; PCIOL posterior chamber intraocular lens; Phaco/IOL phacoemulsification with intraocular lens placement; [place] photorefractive keratectomy; LASIK laser assisted in situ keratomileusis;  HTN hypertension; DM diabetes mellitus; COPD chronic obstructive pulmonary disease

## 2021-07-18 DIAGNOSIS — L02419 Cutaneous abscess of limb, unspecified: Secondary | ICD-10-CM | POA: Diagnosis not present

## 2021-07-22 ENCOUNTER — Inpatient Hospital Stay: Payer: Medicare Other | Attending: Internal Medicine | Admitting: Internal Medicine

## 2021-07-22 ENCOUNTER — Inpatient Hospital Stay: Payer: Medicare Other

## 2021-07-22 ENCOUNTER — Encounter: Payer: Self-pay | Admitting: Internal Medicine

## 2021-07-22 ENCOUNTER — Other Ambulatory Visit: Payer: Self-pay

## 2021-07-22 VITALS — BP 134/65 | HR 67 | Temp 97.5°F | Resp 17 | Wt 226.7 lb

## 2021-07-22 DIAGNOSIS — C3412 Malignant neoplasm of upper lobe, left bronchus or lung: Secondary | ICD-10-CM | POA: Insufficient documentation

## 2021-07-22 DIAGNOSIS — Z79899 Other long term (current) drug therapy: Secondary | ICD-10-CM | POA: Diagnosis not present

## 2021-07-22 DIAGNOSIS — C3491 Malignant neoplasm of unspecified part of right bronchus or lung: Secondary | ICD-10-CM

## 2021-07-22 DIAGNOSIS — C3431 Malignant neoplasm of lower lobe, right bronchus or lung: Secondary | ICD-10-CM | POA: Diagnosis not present

## 2021-07-22 DIAGNOSIS — Z95828 Presence of other vascular implants and grafts: Secondary | ICD-10-CM

## 2021-07-22 DIAGNOSIS — D649 Anemia, unspecified: Secondary | ICD-10-CM | POA: Diagnosis not present

## 2021-07-22 DIAGNOSIS — Z5111 Encounter for antineoplastic chemotherapy: Secondary | ICD-10-CM

## 2021-07-22 DIAGNOSIS — E039 Hypothyroidism, unspecified: Secondary | ICD-10-CM | POA: Insufficient documentation

## 2021-07-22 LAB — CBC WITH DIFFERENTIAL (CANCER CENTER ONLY)
Abs Immature Granulocytes: 0.01 10*3/uL (ref 0.00–0.07)
Basophils Absolute: 0 10*3/uL (ref 0.0–0.1)
Basophils Relative: 1 %
Eosinophils Absolute: 0.1 10*3/uL (ref 0.0–0.5)
Eosinophils Relative: 1 %
HCT: 31.6 % — ABNORMAL LOW (ref 39.0–52.0)
Hemoglobin: 9.7 g/dL — ABNORMAL LOW (ref 13.0–17.0)
Immature Granulocytes: 0 %
Lymphocytes Relative: 10 %
Lymphs Abs: 0.5 10*3/uL — ABNORMAL LOW (ref 0.7–4.0)
MCH: 28.2 pg (ref 26.0–34.0)
MCHC: 30.7 g/dL (ref 30.0–36.0)
MCV: 91.9 fL (ref 80.0–100.0)
Monocytes Absolute: 0.8 10*3/uL (ref 0.1–1.0)
Monocytes Relative: 16 %
Neutro Abs: 3.6 10*3/uL (ref 1.7–7.7)
Neutrophils Relative %: 72 %
Platelet Count: 114 10*3/uL — ABNORMAL LOW (ref 150–400)
RBC: 3.44 MIL/uL — ABNORMAL LOW (ref 4.22–5.81)
RDW: 16.2 % — ABNORMAL HIGH (ref 11.5–15.5)
WBC Count: 5 10*3/uL (ref 4.0–10.5)
nRBC: 0 % (ref 0.0–0.2)

## 2021-07-22 LAB — CMP (CANCER CENTER ONLY)
ALT: 11 U/L (ref 0–44)
AST: 15 U/L (ref 15–41)
Albumin: 3.2 g/dL — ABNORMAL LOW (ref 3.5–5.0)
Alkaline Phosphatase: 183 U/L — ABNORMAL HIGH (ref 38–126)
Anion gap: 8 (ref 5–15)
BUN: 25 mg/dL — ABNORMAL HIGH (ref 8–23)
CO2: 24 mmol/L (ref 22–32)
Calcium: 9 mg/dL (ref 8.9–10.3)
Chloride: 110 mmol/L (ref 98–111)
Creatinine: 1.67 mg/dL — ABNORMAL HIGH (ref 0.61–1.24)
GFR, Estimated: 41 mL/min — ABNORMAL LOW (ref >60)
Glucose, Bld: 99 mg/dL (ref 70–99)
Potassium: 3.7 mmol/L (ref 3.5–5.1)
Sodium: 142 mmol/L (ref 135–145)
Total Bilirubin: 0.6 mg/dL (ref 0.3–1.2)
Total Protein: 6 g/dL — ABNORMAL LOW (ref 6.5–8.1)

## 2021-07-22 MED ORDER — SODIUM CHLORIDE 0.9% FLUSH
10.0000 mL | Freq: Once | INTRAVENOUS | Status: AC
  Administered 2021-07-22: 10 mL
  Filled 2021-07-22: qty 10

## 2021-07-22 MED ORDER — HEPARIN SOD (PORK) LOCK FLUSH 100 UNIT/ML IV SOLN
500.0000 [IU] | Freq: Once | INTRAVENOUS | Status: AC
  Administered 2021-07-22: 500 [IU]
  Filled 2021-07-22: qty 5

## 2021-07-22 NOTE — Progress Notes (Signed)
[place] [phone]   [phone]  OFFICE PROGRESS NOTE  [name], MD [address]  [place] [address]  DIAGNOSIS: Stage IV (T4, N2, M1 a) non-small cell lung cancer, mucinous adenocarcinoma presented with bilateral large masses in the left upper lobe as well as the right lower lobe in addition to other small nodules in the right upper lobe with hilar and mediastinal lymphadenopathy diagnosed in August 2021.  Biomarker Findings Microsatellite status - MS-Stable Tumor Mutational Burden - 4 Muts/Mb Genomic Findings For a complete list of the genes assayed, please refer to the Appendix. KRAS G12C GNAS R201H 7 Disease relevant genes with no reportable alterations: ALK, BRAF, EGFR, ERBB2, MET, RET, ROS1  PDL1 Expression: 20%.  PRIOR THERAPY: Systemic chemotherapy with carboplatin for AUC of 5, Alimta 500 mg/M2 and Keytruda 200 mg IV every 3 weeks.  First dose 09/09/2020.  Status post 6 cycles.  Starting from cycle #5 he has been on maintenance treatment with Alimta and Keytruda.  CURRENT THERAPY: Lumakras (Sotorasib) 960 mg p.o. daily.  First dose started 01/25/2021.  Status post 6 months of treatment.  INTERVAL HISTORY: Randall Wilkins 80 y.o. male returns to the clinic today for follow-up visit accompanied by his wife.  The patient is feeling fine today with no concerning complaints except for the baseline fatigue and swelling of the lower extremities.  He had cardioversion for the atrial fibrillation recently but he reconverted back to atrial fibrillation few days later.  He denied having any current chest pain but has shortness of breath with exertion with no cough or hemoptysis.  He denied having any fever or chills.  He has no nausea, vomiting, diarrhea or constipation.  He has no headache or visual changes.  He continues to tolerate his treatment with Lumakras (Sotorasib) fairly well.  He is here today for evaluation and  repeat blood work.    MEDICAL HISTORY: Past Medical History:  Diagnosis Date   Aortic stenosis, mild 11/14/2013   Aortic valve sclerosis 03/29/2015   Bilateral leg edema 05/21/2014   CAD (coronary artery disease)    Chronic diastolic congestive heart failure (HCC)    Chronic kidney disease (CKD), stage III (moderate) (HCC)    Diabetes 1.5, managed as type 1 ([place]) 02/04/2013   Diabetic peripheral neuropathy associated with type 1 diabetes mellitus ([place]) 11/14/2013   Dysphagia    Dyspnea on exertion 03/21/2015   Exogenous obesity    Gout    Heart attack ([place])    History of nuclear stress test 07/2011   dipyridamole; fixed inferolateral defect, worse at stress than rest; no reversible ischemia; low risk scan    Hyperlipidemia    Hypertension    Hypothyroidism    Insulin dependent diabetes mellitus    Left foot drop    Left main coronary artery disease 03/28/2015   lung ca dx'd 08/2020   Macular pucker, right eye 01/29/2021   Vitrectomy membrane peel right eye 02-12-2021   Memory loss    Obesity (BMI 30-39.9) 11/14/2013   Obstructive sleep apnea 03/21/2015   OSA on CPAP    uses a cpap   Peptic ulcer with hemorrhage 03/28/2015   Peripheral neuropathy    Pneumonia    Rhabdomyolysis    S/P CABG x 2 03/29/2015   LIMA to Diagonal, SVG to OM, EVH via right thigh   Stroke ([place])    L patietal with small scattered lacunar infarcts   Thrombocytopenia ([place])  03/21/2015   Venous insufficiency    Weakness generalized 03/21/2015    ALLERGIES:  is allergic to actos [pioglitazone], spironolactone, metformin and related, niaspan [niacin er], and wound dressing adhesive.  MEDICATIONS:  Current Outpatient Medications  Medication Sig Dispense Refill   allopurinol (ZYLOPRIM) 100 MG tablet TAKE ONE TABLET EVERY DAY 30 tablet 6   amitriptyline (ELAVIL) 50 MG tablet Take 50 mg by mouth at bedtime.     amLODipine (NORVASC) 10 MG tablet TAKE ONE TABLET BY MOUTH ONCE DAILY 90 tablet 3   apixaban (ELIQUIS) 5 MG TABS  tablet Take 1 tablet (5 mg total) by mouth 2 (two) times daily. 60 tablet 11   Blood Glucose Monitoring Suppl (CONTOUR NEXT MONITOR) w/Device KIT 1 Device by Does not apply route 3 (three) times daily. Use to check blood sugars 3 times daily. Dx Code E13.9 1 kit 2   colchicine 0.6 MG tablet Take 0.6 mg by mouth 3 (three) times daily as needed.     CONTOUR NEXT TEST test strip USE 1 STRIP TO CHECK GLUCOSE 4 TIMES DAILY 300 each 1   furosemide (LASIX) 40 MG tablet Take 2 tablets (80 mg total) by mouth 2 (two) times daily. 180 tablet 1   hydrALAZINE (APRESOLINE) 25 MG tablet Take 1.5 tablets (37.5 mg total) by mouth in the morning and at bedtime. 270 tablet 3   insulin degludec (TRESIBA FLEXTOUCH) 200 UNIT/ML FlexTouch Pen Inject 12 Units into the skin at bedtime. (Patient taking differently: Inject 10-12 Units into the skin at bedtime.)     insulin lispro (HUMALOG) 100 UNIT/ML cartridge Inject 5-15 Units into the skin 3 (three) times daily with meals. Sliding scale.     Insulin Pen Needle (EASY COMFORT PEN NEEDLES) 33G X 4 MM MISC 1 each by Does not apply route See admin instructions. Use to inject insulin 5 times daily. 150 each 3   isosorbide dinitrate (ISORDIL) 20 MG tablet Take 1 tablet (20 mg total) by mouth in the morning and at bedtime. 180 tablet 3   levothyroxine (SYNTHROID, LEVOTHROID) 75 MCG tablet Take 75 mcg by mouth daily before breakfast.   1   lidocaine-prilocaine (EMLA) cream APPLY A TEASPOON OVER PORT SITE AT LEASTONE HOUR BEFORE LAB APPOINTMENT. DO NOT RUB IN AND COVER WITH PLASTIC WRAP (Patient taking differently: Apply 1 application topically See admin instructions. Apply a teaspoon over port site at least one hour before lab appointment. Do not rub in and cover with plastic wrap.) 30 g 0   lisinopril (ZESTRIL) 10 MG tablet Take 1 tablet (10 mg total) by mouth daily. 90 tablet 3   metFORMIN (GLUCOPHAGE-XR) 750 MG 24 hr tablet TAKE 1 TABLET WITH DINNER 90 tablet 1   ondansetron  (ZOFRAN) 4 MG tablet Take 1 tablet (4 mg total) by mouth every 6 (six) hours as needed for nausea. 20 tablet 0   OXYGEN Inhale 2 L into the lungs. At bedtime and during the day prn     potassium chloride SA (K-DUR,KLOR-CON) 20 MEQ tablet Take 20 mEq by mouth 2 (two) times daily.      prochlorperazine (COMPAZINE) 10 MG tablet Take 1 tablet (10 mg total) by mouth every 6 (six) hours as needed for nausea or vomiting. 30 tablet 0   rosuvastatin (CRESTOR) 20 MG tablet Take 20 mg by mouth daily.     sotorasib 120 MG TABS TAKE 960 MG BY MOUTH EVERY MORNING. (Patient taking differently: Take 960 mg by mouth daily.) 240 tablet 3  Tamsulosin HCl (FLOMAX) 0.4 MG CAPS Take 0.4 mg by mouth at bedtime.     vitamin B-12 (CYANOCOBALAMIN) 1000 MCG tablet Take 1,000 mcg by mouth at bedtime.     No current facility-administered medications for this visit.    SURGICAL HISTORY:  Past Surgical History:  Procedure Laterality Date   BACK SURGERY  2002   lumbosacral. 11 back surgeries total   BRONCHIAL BIOPSY  08/06/2020   Procedure: BRONCHIAL BIOPSIES;  Surgeon: Collene Gobble, MD;  Location: [facility] ENDOSCOPY;  Service: Pulmonary;;   BRONCHIAL BRUSHINGS  08/06/2020   Procedure: BRONCHIAL BRUSHINGS;  Surgeon: Collene Gobble, MD;  Location: [facility] ENDOSCOPY;  Service: Pulmonary;;   BRONCHIAL NEEDLE ASPIRATION BIOPSY  08/06/2020   Procedure: BRONCHIAL NEEDLE ASPIRATION BIOPSIES;  Surgeon: Collene Gobble, MD;  Location: [facility] ENDOSCOPY;  Service: Pulmonary;;   CARDIOVERSION N/A 05/06/2021   Procedure: CARDIOVERSION;  Surgeon: Geralynn Rile, MD;  Location: [facility];  Service: Cardiovascular;  Laterality: N/A;   Carotid Doppler  03/2013   bilat bulb/prox ICAs - mild amount of fibrous plaque with no evidence of diameter reduction   CARPAL TUNNEL RELEASE Bilateral 08/09/2014   Procedure: BILATERAL CARPAL TUNNEL RELEASE;  Surgeon: Daryll Brod, MD;  Location: [facility];  Service: Orthopedics;   Laterality: Bilateral;  ANESTHESIA:  IV REGIONAL BIL FAB   CHOLECYSTECTOMY     COLONOSCOPY     CORONARY ANGIOPLASTY  10/13/1996   CORONARY ANGIOPLASTY  09/21/1989   emergency PTCA   CORONARY ANGIOPLASTY  10/13/1996   Multi-Link diagonal & OD stenting (Dr. Marella Chimes)   [place]  12/03/1997   disease of mid DX-1 ~50% & in mid PLA & PDA (distal lesions) (Dr. Marella Chimes)    CORONARY ANGIOPLASTY  10/14/1999   progression of disease distal PLA & PDA; progression of disease prox RCA - moderate (Dr. Marella Chimes)    CORONARY ANGIOPLASTY WITH STENT PLACEMENT  04/04/2004   4.0x19m non-DES (thrombectomy via AngioJet) to RCA for high grade stenosis (Dr. RMarella Chimes   CORONARY ARTERY BYPASS GRAFT N/A 03/29/2015   Procedure: CORONARY ARTERY BYPASS GRAFTING TIMES TWO USING LEFT INTERNAL MAMMARY ARTERY AND RIGHT LEG GREATER SAPHENOUS VEIN HARVESTED ENDOSCOPICALLY.;  Surgeon: CRexene Alberts MD;  [place] [facility]  Service: Open Heart Surgery;  Laterality: N/A;   ESOPHAGOGASTRODUODENOSCOPY N/A 03/27/2015   Procedure: ESOPHAGOGASTRODUODENOSCOPY (EGD);  Surgeon: MClarene Essex MD;  [facility]  Service: Endoscopy;  Laterality: N/A;  possible dilation   ESOPHAGOGASTRODUODENOSCOPY N/A 10/05/2020   Procedure: ESOPHAGOGASTRODUODENOSCOPY (EGD);  Surgeon: OArta Silence MD;  [facility];  Service: Endoscopy;  Laterality: N/A;   EYE SURGERY Right    macular fold-02/2021   FINE NEEDLE ASPIRATION  08/06/2020   Procedure: FINE NEEDLE ASPIRATION (FNA) LINEAR;  Surgeon: BCollene Gobble MD;  [facility];  Service: Pulmonary;;   IR IMAGING GUIDED PORT INSERTION  10/07/2020   LEFT HEART CATHETERIZATION WITH CORONARY ANGIOGRAM N/A 03/28/2015   Procedure: LEFT HEART CATHETERIZATION WITH CORONARY ANGIOGRAM;  Surgeon: Peter M JMartinique MD;  [facility];  Service: Cardiovascular;  Laterality: N/A;   SINUS ENDO W/FUSION     TEE WITHOUT CARDIOVERSION N/A 05/06/2021    Procedure: TRANSESOPHAGEAL ECHOCARDIOGRAM (TEE);  Surgeon: OGeralynn Rile MD;  [place] [facility]  Service: Cardiovascular;  Laterality: N/A;   TONSILLECTOMY     TRANSTHORACIC ECHOCARDIOGRAM  08/08/2013   EF 55-60%, mild conc hypertrophy, grade 1 diastolic dysfunction; AV with mild stenosis; LA & RA mildly dilated  VIDEO BRONCHOSCOPY WITH ENDOBRONCHIAL NAVIGATION N/A 08/06/2020   Procedure: VIDEO BRONCHOSCOPY WITH ENDOBRONCHIAL NAVIGATION;  Surgeon: Collene Gobble, MD;  Location: [facility] ENDOSCOPY;  Service: Pulmonary;  Laterality: N/A;   VIDEO BRONCHOSCOPY WITH ENDOBRONCHIAL ULTRASOUND N/A 08/06/2020   Procedure: VIDEO BRONCHOSCOPY WITH ENDOBRONCHIAL ULTRASOUND;  Surgeon: Collene Gobble, MD;  Location: [facility] ENDOSCOPY;  Service: Pulmonary;  Laterality: N/A;    REVIEW OF SYSTEMS:  A comprehensive review of systems was negative except for: Constitutional: positive for fatigue   PHYSICAL EXAMINATION: General appearance: alert, cooperative, fatigued, and no distress Head: Normocephalic, without obvious abnormality, atraumatic Neck: no adenopathy, no JVD, supple, symmetrical, trachea midline, and thyroid not enlarged, symmetric, no tenderness/mass/nodules Lymph nodes: Cervical, supraclavicular, and axillary nodes normal. Resp: clear to auscultation bilaterally Back: symmetric, no curvature. ROM normal. No CVA tenderness. Cardio: irregularly irregular rhythm GI: soft, non-tender; bowel sounds normal; no masses,  no organomegaly Extremities: edema 1 + edema bilaterally.  ECOG PERFORMANCE STATUS: 1 - Symptomatic but completely ambulatory  Blood pressure 134/65, pulse 67, temperature (!) 97.5 F (36.4 C), temperature source Oral, resp. rate 17, weight 226 lb 11.2 oz (102.8 kg), SpO2 95 %.  LABORATORY DATA: Lab Results  Component Value Date   WBC 3.5 (L) 06/17/2021   HGB 10.1 (L) 06/17/2021   HCT 32.3 (L) 06/17/2021   MCV 93.4 06/17/2021   PLT 73 (L) 06/17/2021      Chemistry       Component Value Date/Time   NA 142 06/17/2021 1254   NA 141 06/11/2021 0937   K 4.5 06/17/2021 1254   CL 109 06/17/2021 1254   CO2 24 06/17/2021 1254   BUN 41 (H) 06/17/2021 1254   BUN 43 (H) 06/11/2021 0937   CREATININE 1.73 (H) 06/17/2021 1254   CREATININE 1.30 (H) 04/30/2020 1044      Component Value Date/Time   CALCIUM 9.0 06/17/2021 1254   ALKPHOS 78 06/17/2021 1254   AST 20 06/17/2021 1254   ALT 14 06/17/2021 1254   BILITOT 0.6 06/17/2021 1254       RADIOGRAPHIC STUDIES: OCT, Retina - OU - Both Eyes  Result Date: 07/16/2021 Right Eye Quality was good. Scan locations included subfoveal. Central Foveal Thickness: 327. Progression has been stable. Left Eye Quality was good. Scan locations included subfoveal. Central Foveal Thickness: 318. Progression has been stable. Findings include epiretinal membrane. Notes CME also has improved now since patient has ceased decompression and mashing rubbing of the eye right eye. Also improved post vitrectomy membrane.    ASSESSMENT AND PLAN: This is a very pleasant 80 years old white male diagnosed with a stage IV (T4, N2, M1 a) non-small cell lung cancer, mucinous adenocarcinoma presented with bilateral large masses in the left upper lobe as well as the right upper lobe in addition to other pulmonary nodules in the right upper lobe with hilar and mediastinal lymphadenopathy diagnosed in August 2021. Molecular study showed positive KRAS G12C mutation and PD-L1 expression of 20%. The patient is currently undergoing systemic chemotherapy with carboplatin for AUC of 5, Alimta 500 mg/M2 and Keytruda 200 mg IV every 3 weeks status post 6 cycles. Starting from cycle #5 the patient is on maintenance treatment with Alimta and Keytruda every 3 weeks.  His treatment was discontinued secondary to disease progression.  The patient is currently on oral treatment with Lumakras (Sotorasib) 960 mg p.o. daily started on 01/25/2021.  Status post 6 months of  treatment. The patient continues to tolerate this treatment well with no concerning  adverse effects. CBC today is unremarkable except for mild anemia and thrombocytopenia.  Comprehensive metabolic panel is still pending. I recommended for the patient to continue his current treatment with Lumakras (Sotorasib) with the same dose. I will see him back for follow-up visit in 1 months for evaluation and repeat blood work. We will consider repeating CT scan in 2 months. The patient was advised to call immediately if he has any other concerning symptoms in the interval.  The patient voices understanding of current disease status and treatment options and is in agreement with the current care plan.  All questions were answered. The patient knows to call the clinic with any problems, questions or concerns. We can certainly see the patient much sooner if necessary.  Disclaimer: This note was dictated with voice recognition software. Similar sounding words can inadvertently be transcribed and may not be corrected upon review.

## 2021-07-24 ENCOUNTER — Telehealth: Payer: Self-pay | Admitting: Internal Medicine

## 2021-07-24 NOTE — Telephone Encounter (Signed)
Scheduled per los. Called, not able to leave msg. Mailed printout  

## 2021-08-05 DIAGNOSIS — I10 Essential (primary) hypertension: Secondary | ICD-10-CM | POA: Diagnosis not present

## 2021-08-05 DIAGNOSIS — E039 Hypothyroidism, unspecified: Secondary | ICD-10-CM | POA: Diagnosis not present

## 2021-08-05 DIAGNOSIS — E1149 Type 2 diabetes mellitus with other diabetic neurological complication: Secondary | ICD-10-CM | POA: Diagnosis not present

## 2021-08-05 DIAGNOSIS — N183 Chronic kidney disease, stage 3 unspecified: Secondary | ICD-10-CM | POA: Diagnosis not present

## 2021-08-08 DIAGNOSIS — C3491 Malignant neoplasm of unspecified part of right bronchus or lung: Secondary | ICD-10-CM | POA: Diagnosis not present

## 2021-08-20 ENCOUNTER — Other Ambulatory Visit: Payer: Self-pay

## 2021-08-20 ENCOUNTER — Inpatient Hospital Stay: Payer: Medicare Other

## 2021-08-20 ENCOUNTER — Inpatient Hospital Stay: Payer: Medicare Other | Attending: Internal Medicine | Admitting: Internal Medicine

## 2021-08-20 ENCOUNTER — Encounter: Payer: Self-pay | Admitting: Internal Medicine

## 2021-08-20 VITALS — BP 119/51 | HR 68 | Temp 97.3°F | Resp 20 | Ht 67.0 in | Wt 219.0 lb

## 2021-08-20 DIAGNOSIS — Z95828 Presence of other vascular implants and grafts: Secondary | ICD-10-CM

## 2021-08-20 DIAGNOSIS — Z5112 Encounter for antineoplastic immunotherapy: Secondary | ICD-10-CM

## 2021-08-20 DIAGNOSIS — C3491 Malignant neoplasm of unspecified part of right bronchus or lung: Secondary | ICD-10-CM

## 2021-08-20 DIAGNOSIS — Z8673 Personal history of transient ischemic attack (TIA), and cerebral infarction without residual deficits: Secondary | ICD-10-CM | POA: Insufficient documentation

## 2021-08-20 DIAGNOSIS — R600 Localized edema: Secondary | ICD-10-CM

## 2021-08-20 DIAGNOSIS — C3412 Malignant neoplasm of upper lobe, left bronchus or lung: Secondary | ICD-10-CM | POA: Insufficient documentation

## 2021-08-20 DIAGNOSIS — Z79899 Other long term (current) drug therapy: Secondary | ICD-10-CM | POA: Diagnosis not present

## 2021-08-20 DIAGNOSIS — E039 Hypothyroidism, unspecified: Secondary | ICD-10-CM | POA: Diagnosis not present

## 2021-08-20 DIAGNOSIS — C3490 Malignant neoplasm of unspecified part of unspecified bronchus or lung: Secondary | ICD-10-CM

## 2021-08-20 LAB — CBC WITH DIFFERENTIAL (CANCER CENTER ONLY)
Abs Immature Granulocytes: 0.02 10*3/uL (ref 0.00–0.07)
Basophils Absolute: 0 10*3/uL (ref 0.0–0.1)
Basophils Relative: 1 %
Eosinophils Absolute: 0.1 10*3/uL (ref 0.0–0.5)
Eosinophils Relative: 2 %
HCT: 30.5 % — ABNORMAL LOW (ref 39.0–52.0)
Hemoglobin: 9.6 g/dL — ABNORMAL LOW (ref 13.0–17.0)
Immature Granulocytes: 1 %
Lymphocytes Relative: 15 %
Lymphs Abs: 0.6 10*3/uL — ABNORMAL LOW (ref 0.7–4.0)
MCH: 28.6 pg (ref 26.0–34.0)
MCHC: 31.5 g/dL (ref 30.0–36.0)
MCV: 90.8 fL (ref 80.0–100.0)
Monocytes Absolute: 0.6 10*3/uL (ref 0.1–1.0)
Monocytes Relative: 15 %
Neutro Abs: 2.8 10*3/uL (ref 1.7–7.7)
Neutrophils Relative %: 66 %
Platelet Count: 87 10*3/uL — ABNORMAL LOW (ref 150–400)
RBC: 3.36 MIL/uL — ABNORMAL LOW (ref 4.22–5.81)
RDW: 16.9 % — ABNORMAL HIGH (ref 11.5–15.5)
WBC Count: 4.1 10*3/uL (ref 4.0–10.5)
nRBC: 0 % (ref 0.0–0.2)

## 2021-08-20 LAB — CMP (CANCER CENTER ONLY)
ALT: <6 U/L (ref 0–44)
AST: 13 U/L — ABNORMAL LOW (ref 15–41)
Albumin: 3.1 g/dL — ABNORMAL LOW (ref 3.5–5.0)
Alkaline Phosphatase: 100 U/L (ref 38–126)
Anion gap: 10 (ref 5–15)
BUN: 21 mg/dL (ref 8–23)
CO2: 26 mmol/L (ref 22–32)
Calcium: 8.7 mg/dL — ABNORMAL LOW (ref 8.9–10.3)
Chloride: 106 mmol/L (ref 98–111)
Creatinine: 1.63 mg/dL — ABNORMAL HIGH (ref 0.61–1.24)
GFR, Estimated: 42 mL/min — ABNORMAL LOW (ref >60)
Glucose, Bld: 110 mg/dL — ABNORMAL HIGH (ref 70–99)
Potassium: 3.7 mmol/L (ref 3.5–5.1)
Sodium: 142 mmol/L (ref 135–145)
Total Bilirubin: 0.6 mg/dL (ref 0.3–1.2)
Total Protein: 6 g/dL — ABNORMAL LOW (ref 6.5–8.1)

## 2021-08-20 MED ORDER — SODIUM CHLORIDE 0.9% FLUSH
10.0000 mL | Freq: Once | INTRAVENOUS | Status: AC
  Administered 2021-08-20: 10 mL

## 2021-08-20 MED ORDER — HEPARIN SOD (PORK) LOCK FLUSH 100 UNIT/ML IV SOLN
500.0000 [IU] | Freq: Once | INTRAVENOUS | Status: AC
  Administered 2021-08-20: 500 [IU]

## 2021-08-20 NOTE — Progress Notes (Signed)
[place] [phone]   [phone]  OFFICE PROGRESS NOTE  [name], MD [address] [place] [address]  DIAGNOSIS: Stage IV (T4, N2, M1 a) non-small cell lung cancer, mucinous adenocarcinoma presented with bilateral large masses in the left upper lobe as well as the right lower lobe in addition to other small nodules in the right upper lobe with hilar and mediastinal lymphadenopathy diagnosed in August 2021.  Biomarker Findings Microsatellite status - MS-Stable Tumor Mutational Burden - 4 Muts/Mb Genomic Findings For a complete list of the genes assayed, please refer to the Appendix. KRAS G12C GNAS R201H 7 Disease relevant genes with no reportable alterations: ALK, BRAF, EGFR, ERBB2, MET, RET, ROS1  PDL1 Expression: 20%.  PRIOR THERAPY: Systemic chemotherapy with carboplatin for AUC of 5, Alimta 500 mg/M2 and Keytruda 200 mg IV every 3 weeks.  First dose 09/09/2020.  Status post 6 cycles.  Starting from cycle #5 he has been on maintenance treatment with Alimta and Keytruda.  CURRENT THERAPY: Lumakras (Sotorasib) 960 mg p.o. daily.  First dose started 01/25/2021.  Status post 7 months of treatment.  INTERVAL HISTORY: Randall Wilkins 80 y.o. male returns to the clinic today for follow-up visit accompanied by his wife.  The patient is feeling fine today with no concerning complaints.  He continues to tolerate his treatment with Lumakras (Sotorasib) fairly well.  He lost few pounds since the last visit because of increasing the diuretics and diuresis.  He denied having any current chest pain, shortness of breath, cough or hemoptysis.  He denied having any fever or chills.  He has no nausea, vomiting, diarrhea or constipation.  He has no headache or visual changes.  He is here today for evaluation and repeat blood work.    MEDICAL HISTORY: Past Medical History:  Diagnosis Date   Aortic stenosis, mild 11/14/2013   Aortic  valve sclerosis 03/29/2015   Bilateral leg edema 05/21/2014   CAD (coronary artery disease)    Chronic diastolic congestive heart failure (HCC)    Chronic kidney disease (CKD), stage III (moderate) (HCC)    Diabetes 1.5, managed as type 1 ([place]) 02/04/2013   Diabetic peripheral neuropathy associated with type 1 diabetes mellitus ([place]) 11/14/2013   Dysphagia    Dyspnea on exertion 03/21/2015   Exogenous obesity    Gout    Heart attack ([place])    History of nuclear stress test 07/2011   dipyridamole; fixed inferolateral defect, worse at stress than rest; no reversible ischemia; low risk scan    Hyperlipidemia    Hypertension    Hypothyroidism    Insulin dependent diabetes mellitus    Left foot drop    Left main coronary artery disease 03/28/2015   lung ca dx'd 08/2020   Macular pucker, right eye 01/29/2021   Vitrectomy membrane peel right eye 02-12-2021   Memory loss    Obesity (BMI 30-39.9) 11/14/2013   Obstructive sleep apnea 03/21/2015   OSA on CPAP    uses a cpap   Peptic ulcer with hemorrhage 03/28/2015   Peripheral neuropathy    Pneumonia    Rhabdomyolysis    S/P CABG x 2 03/29/2015   LIMA to Diagonal, SVG to OM, EVH via right thigh   Stroke ([place])    L patietal with small scattered lacunar infarcts   Thrombocytopenia ([place]) 03/21/2015   Venous insufficiency    Weakness generalized 03/21/2015    ALLERGIES:  is allergic to actos [  pioglitazone], spironolactone, metformin and related, niaspan [niacin er], and wound dressing adhesive.  MEDICATIONS:  Current Outpatient Medications  Medication Sig Dispense Refill   allopurinol (ZYLOPRIM) 100 MG tablet TAKE ONE TABLET EVERY DAY 30 tablet 6   amitriptyline (ELAVIL) 50 MG tablet Take 50 mg by mouth at bedtime.     amLODipine (NORVASC) 10 MG tablet TAKE ONE TABLET BY MOUTH ONCE DAILY 90 tablet 3   apixaban (ELIQUIS) 5 MG TABS tablet Take 1 tablet (5 mg total) by mouth 2 (two) times daily. 60 tablet 11   Blood Glucose Monitoring Suppl (CONTOUR NEXT  MONITOR) w/Device KIT 1 Device by Does not apply route 3 (three) times daily. Use to check blood sugars 3 times daily. Dx Code E13.9 1 kit 2   colchicine 0.6 MG tablet Take 0.6 mg by mouth 3 (three) times daily as needed.     CONTOUR NEXT TEST test strip USE 1 STRIP TO CHECK GLUCOSE 4 TIMES DAILY 300 each 1   furosemide (LASIX) 40 MG tablet Take 2 tablets (80 mg total) by mouth 2 (two) times daily. 180 tablet 1   hydrALAZINE (APRESOLINE) 25 MG tablet Take 1.5 tablets (37.5 mg total) by mouth in the morning and at bedtime. 270 tablet 3   insulin degludec (TRESIBA FLEXTOUCH) 200 UNIT/ML FlexTouch Pen Inject 12 Units into the skin at bedtime. (Patient taking differently: Inject 6 Units into the skin at bedtime.)     insulin lispro (HUMALOG) 100 UNIT/ML cartridge Inject 5-15 Units into the skin 3 (three) times daily with meals. Sliding scale.     Insulin Pen Needle (EASY COMFORT PEN NEEDLES) 33G X 4 MM MISC 1 each by Does not apply route See admin instructions. Use to inject insulin 5 times daily. 150 each 3   isosorbide dinitrate (ISORDIL) 20 MG tablet Take 1 tablet (20 mg total) by mouth in the morning and at bedtime. 180 tablet 3   levothyroxine (SYNTHROID, LEVOTHROID) 75 MCG tablet Take 75 mcg by mouth daily before breakfast.   1   lidocaine-prilocaine (EMLA) cream APPLY A TEASPOON OVER PORT SITE AT LEASTONE HOUR BEFORE LAB APPOINTMENT. DO NOT RUB IN AND COVER WITH PLASTIC WRAP (Patient taking differently: Apply 1 application topically See admin instructions. Apply a teaspoon over port site at least one hour before lab appointment. Do not rub in and cover with plastic wrap.) 30 g 0   lisinopril (ZESTRIL) 10 MG tablet Take 1 tablet (10 mg total) by mouth daily. 90 tablet 3   ondansetron (ZOFRAN) 4 MG tablet Take 1 tablet (4 mg total) by mouth every 6 (six) hours as needed for nausea. 20 tablet 0   OXYGEN Inhale 2 L into the lungs. At bedtime and during the day prn     potassium chloride SA  (K-DUR,KLOR-CON) 20 MEQ tablet Take 20 mEq by mouth 2 (two) times daily.      prochlorperazine (COMPAZINE) 10 MG tablet Take 1 tablet (10 mg total) by mouth every 6 (six) hours as needed for nausea or vomiting. 30 tablet 0   rosuvastatin (CRESTOR) 20 MG tablet Take 20 mg by mouth daily.     sotorasib 120 MG TABS TAKE 960 MG BY MOUTH EVERY MORNING. (Patient taking differently: Take 960 mg by mouth daily.) 240 tablet 3   Tamsulosin HCl (FLOMAX) 0.4 MG CAPS Take 0.4 mg by mouth at bedtime.     vitamin B-12 (CYANOCOBALAMIN) 1000 MCG tablet Take 1,000 mcg by mouth at bedtime.     No current  facility-administered medications for this visit.    SURGICAL HISTORY:  Past Surgical History:  Procedure Laterality Date   BACK SURGERY  2002   lumbosacral. 11 back surgeries total   BRONCHIAL BIOPSY  08/06/2020   Procedure: BRONCHIAL BIOPSIES;  Surgeon: Collene Gobble, MD;  [facility] ENDOSCOPY;  Service: Pulmonary;;   BRONCHIAL BRUSHINGS  08/06/2020   Procedure: BRONCHIAL BRUSHINGS;  Surgeon: Collene Gobble, MD;  Location: [facility] ENDOSCOPY;  Service: Pulmonary;;   BRONCHIAL NEEDLE ASPIRATION BIOPSY  08/06/2020   Procedure: BRONCHIAL NEEDLE ASPIRATION BIOPSIES;  Surgeon: Collene Gobble, MD;  Location[facility] ENDOSCOPY;  Service: Pulmonary;;   CARDIOVERSION N/A 05/06/2021   Procedure: CARDIOVERSION;  Surgeon: Geralynn Rile, MD;  Location: [place];  Service: Cardiovascular;  Laterality: N/A;   Carotid Doppler  03/2013   bilat bulb/prox ICAs - mild amount of fibrous plaque with no evidence of diameter reduction   CARPAL TUNNEL RELEASE Bilateral 08/09/2014   Procedure: BILATERAL CARPAL TUNNEL RELEASE;  Surgeon: Daryll Brod, MD;  Location: [place];  Service: Orthopedics;  Laterality: Bilateral;  ANESTHESIA:  IV REGIONAL BIL FAB   CHOLECYSTECTOMY     COLONOSCOPY     CORONARY ANGIOPLASTY  10/13/1996   CORONARY ANGIOPLASTY  09/21/1989   emergency PTCA   CORONARY ANGIOPLASTY   10/13/1996   Multi-Link diagonal & OD stenting (Dr. Marella Chimes)   [place]  12/03/1997   disease of mid DX-1 ~50% & in mid PLA & PDA (distal lesions) (Dr. Marella Chimes)    CORONARY ANGIOPLASTY  10/14/1999   progression of disease distal PLA & PDA; progression of disease prox RCA - moderate (Dr. Marella Chimes)    CORONARY ANGIOPLASTY WITH STENT PLACEMENT  04/04/2004   4.0x68mm non-DES (thrombectomy via AngioJet) to RCA for high grade stenosis (Dr. Marella Chimes)   CORONARY ARTERY BYPASS GRAFT N/A 03/29/2015   Procedure: CORONARY ARTERY BYPASS GRAFTING TIMES TWO USING LEFT INTERNAL MAMMARY ARTERY AND RIGHT LEG GREATER SAPHENOUS VEIN HARVESTED ENDOSCOPICALLY.;  Surgeon: Rexene Alberts, MD;  Location: [place];  Service: Open Heart Surgery;  Laterality: N/A;   ESOPHAGOGASTRODUODENOSCOPY N/A 03/27/2015   Procedure: ESOPHAGOGASTRODUODENOSCOPY (EGD);  Surgeon: Clarene Essex, MD;  Location[facility] ENDOSCOPY;  Service: Endoscopy;  Laterality: N/A;  possible dilation   ESOPHAGOGASTRODUODENOSCOPY N/A 10/05/2020   Procedure: ESOPHAGOGASTRODUODENOSCOPY (EGD);  Surgeon: Arta Silence, MD;  Location: [facility] ENDOSCOPY;  Service: Endoscopy;  Laterality: N/A;   EYE SURGERY Right    macular fold-02/2021   FINE NEEDLE ASPIRATION  08/06/2020   Procedure: FINE NEEDLE ASPIRATION (FNA) LINEAR;  Surgeon: Collene Gobble, MD;  Location: [place] ENDOSCOPY;  Service: Pulmonary;;   IR IMAGING GUIDED PORT INSERTION  10/07/2020   LEFT HEART CATHETERIZATION WITH CORONARY ANGIOGRAM N/A 03/28/2015   Procedure: LEFT HEART CATHETERIZATION WITH CORONARY ANGIOGRAM;  Surgeon: Peter M Martinique, MD;  Location[facility] CATH LAB;  Service: Cardiovascular;  Laterality: N/A;   SINUS ENDO W/FUSION     TEE WITHOUT CARDIOVERSION N/A 05/06/2021   Procedure: TRANSESOPHAGEAL ECHOCARDIOGRAM (TEE);  Surgeon: Geralynn Rile, MD;  Location: [place];  Service: Cardiovascular;  Laterality: N/A;   TONSILLECTOMY     TRANSTHORACIC ECHOCARDIOGRAM   08/08/2013   EF 55-60%, mild conc hypertrophy, grade 1 diastolic dysfunction; AV with mild stenosis; LA & RA mildly dilated   VIDEO BRONCHOSCOPY WITH ENDOBRONCHIAL NAVIGATION N/A 08/06/2020   Procedure: VIDEO BRONCHOSCOPY WITH ENDOBRONCHIAL NAVIGATION;  Surgeon: Collene Gobble, MD;  Location: [facility] ENDOSCOPY;  Service: Pulmonary;  Laterality: N/A;   VIDEO  BRONCHOSCOPY WITH ENDOBRONCHIAL ULTRASOUND N/A 08/06/2020   Procedure: VIDEO BRONCHOSCOPY WITH ENDOBRONCHIAL ULTRASOUND;  Surgeon: Collene Gobble, MD;  Location: [facility] ENDOSCOPY;  Service: Pulmonary;  Laterality: N/A;    REVIEW OF SYSTEMS:  A comprehensive review of systems was negative except for: Constitutional: positive for fatigue and weight loss   PHYSICAL EXAMINATION: General appearance: alert, cooperative, fatigued, and no distress Head: Normocephalic, without obvious abnormality, atraumatic Neck: no adenopathy, no JVD, supple, symmetrical, trachea midline, and thyroid not enlarged, symmetric, no tenderness/mass/nodules Lymph nodes: Cervical, supraclavicular, and axillary nodes normal. Resp: clear to auscultation bilaterally Back: symmetric, no curvature. ROM normal. No CVA tenderness. Cardio: irregularly irregular rhythm GI: soft, non-tender; bowel sounds normal; no masses,  no organomegaly Extremities: edema 1 + edema bilaterally.  ECOG PERFORMANCE STATUS: 1 - Symptomatic but completely ambulatory  Blood pressure (!) 119/51, pulse 68, temperature (!) 97.3 F (36.3 C), temperature source Tympanic, resp. rate 20, height $RemoveBe'5\' 7"'[id number]$  (1.702 m), weight 219 lb (99.3 kg), SpO2 90 %.  LABORATORY DATA: Lab Results  Component Value Date   WBC 5.0 07/22/2021   HGB 9.7 (L) 07/22/2021   HCT 31.6 (L) 07/22/2021   MCV 91.9 07/22/2021   PLT 114 (L) 07/22/2021      Chemistry      Component Value Date/Time   NA 142 07/22/2021 0930   NA 141 06/11/2021 0937   K 3.7 07/22/2021 0930   CL 110 07/22/2021 0930   CO2 24 07/22/2021 0930   BUN 25 (H)  07/22/2021 0930   BUN 43 (H) 06/11/2021 0937   CREATININE 1.67 (H) 07/22/2021 0930   CREATININE 1.30 (H) 04/30/2020 1044      Component Value Date/Time   CALCIUM 9.0 07/22/2021 0930   ALKPHOS 183 (H) 07/22/2021 0930   AST 15 07/22/2021 0930   ALT 11 07/22/2021 0930   BILITOT 0.6 07/22/2021 0930       RADIOGRAPHIC STUDIES: No results found.   ASSESSMENT AND PLAN: This is a very pleasant 80 years old white male diagnosed with a stage IV (T4, N2, M1 a) non-small cell lung cancer, mucinous adenocarcinoma presented with bilateral large masses in the left upper lobe as well as the right upper lobe in addition to other pulmonary nodules in the right upper lobe with hilar and mediastinal lymphadenopathy diagnosed in August 2021. Molecular study showed positive KRAS G12C mutation and PD-L1 expression of 20%. The patient is currently undergoing systemic chemotherapy with carboplatin for AUC of 5, Alimta 500 mg/M2 and Keytruda 200 mg IV every 3 weeks status post 6 cycles. Starting from cycle #5 the patient is on maintenance treatment with Alimta and Keytruda every 3 weeks.  His treatment was discontinued secondary to disease progression.  The patient is currently on oral treatment with Lumakras (Sotorasib) 960 mg p.o. daily started on 01/25/2021.  Status post 7 months of treatment. The patient continues to tolerate his treatment well with no concerning adverse effects. I recommended for him to continue his current treatment with Lumakras (Sotorasib) with the same dose. I will see him back for follow-up visit in 3 weeks for evaluation with repeat CT scan of the chest, abdomen pelvis for restaging of his disease before the patient takes a long vacation for whole month on the beach in [place]. He was advised to call immediately if he has any other concerning symptoms in the interval. The patient voices understanding of current disease status and treatment options and is in agreement with the current care  plan.  All  questions were answered. The patient knows to call the clinic with any problems, questions or concerns. We can certainly see the patient much sooner if necessary.  Disclaimer: This note was dictated with voice recognition software. Similar sounding words can inadvertently be transcribed and may not be corrected upon review.

## 2021-08-24 ENCOUNTER — Other Ambulatory Visit: Payer: Self-pay | Admitting: Endocrinology

## 2021-08-29 ENCOUNTER — Encounter: Payer: Self-pay | Admitting: Internal Medicine

## 2021-08-29 ENCOUNTER — Other Ambulatory Visit: Payer: Self-pay

## 2021-08-29 ENCOUNTER — Ambulatory Visit (INDEPENDENT_AMBULATORY_CARE_PROVIDER_SITE_OTHER): Payer: Medicare Other | Admitting: Internal Medicine

## 2021-08-29 VITALS — BP 131/57 | HR 69 | Ht 67.0 in | Wt 221.6 lb

## 2021-08-29 DIAGNOSIS — R0600 Dyspnea, unspecified: Secondary | ICD-10-CM | POA: Diagnosis not present

## 2021-08-29 DIAGNOSIS — R0609 Other forms of dyspnea: Secondary | ICD-10-CM

## 2021-08-29 DIAGNOSIS — I4892 Unspecified atrial flutter: Secondary | ICD-10-CM | POA: Diagnosis not present

## 2021-08-29 DIAGNOSIS — Z951 Presence of aortocoronary bypass graft: Secondary | ICD-10-CM

## 2021-08-29 DIAGNOSIS — I350 Nonrheumatic aortic (valve) stenosis: Secondary | ICD-10-CM

## 2021-08-29 DIAGNOSIS — Z8673 Personal history of transient ischemic attack (TIA), and cerebral infarction without residual deficits: Secondary | ICD-10-CM

## 2021-08-29 NOTE — Patient Instructions (Signed)
Medication Instructions:  Your physician recommends that you continue on your current medications as directed. Please refer to the Current Medication list given to you today.  *If you need a refill on your cardiac medications before your next appointment, please call your pharmacy*   Follow-Up: At [facility], you and your health needs are our priority.  As part of our continuing mission to provide you with exceptional heart care, we have created designated Provider Care Teams.  These Care Teams include your primary Cardiologist (physician) and Advanced Practice Providers (APPs -  Physician Assistants and Nurse Practitioners) who all work together to provide you with the care you need, when you need it.  We recommend signing up for the patient portal called "MyChart".  Sign up information is provided on this After Visit Summary.  MyChart is used to connect with patients for Virtual Visits (Telemedicine).  Patients are able to view lab/test results, encounter notes, upcoming appointments, etc.  Non-urgent messages can be sent to your provider as well.   To learn more about what you can do with MyChart, go to [email or web address].    Your next appointment:   6 month(s)  The format for your next appointment:   In Person  Provider:   You may see Pixie Casino, MD or one of the following Advanced Practice Providers on your designated Care Team:   Almyra Deforest, PA-C Fabian Sharp, PA-C or  Roby Lofts, [place]   Other Instructions You have been referred to either Dr. Allegra Lai or Dr. Lars Mage for consideration of atrial flutter ablation. You will get a call to set up an appointment @ [address] ([facility])

## 2021-08-29 NOTE — Progress Notes (Signed)
OFFICE NOTE  Chief Complaint:  Follow-up CHF  Primary Care Physician: [name], MD  HPI:  Randall Wilkins is a pleasant 80 year old male patient who was formerly followed by Dr. Rollene Fare. He is here to establish cardiac care with [place] today. He has a history of obesity, multiple comorbidities including insulin-dependent diabetes, diabetic peripheral neuropathy, diabetic nephropathy, venous insufficiency and peripheral neuropathy. He also has obstructive sleep apnea on CPAP and coronary artery disease. He unfortunately suffered a left parietal stroke with small lacunar infarcts noted on MRI in 2013. He is a left foot drop from an L4/L5 neuropathy and wears a brace for this. From a cardiac standpoint he had a large non-drug-eluting stent placed in the RCA in 2005. He has had a stent placed at 97 to a diagonal which was noted to be patent. He's had some problems with dizziness which is improved. He [place] as large to medium secondary to neuropathy and venous insufficiency. Unfortunately not been able to lose a lot of weight due to his difficulty in ambulating and painful neuropathy.  He denies any cardiac chest pain or worsening shortness of breath.  Randall Wilkins returns today for a six-month followup. He is having no complaints. He does report some lower extremity swelling which is stable. He has mild aortic stenosis which is been stable as well. His last echo was in August 2014. He denies any chest pain or worsening shortness of breath. He recently had lab work to Dr. Eugenio Hoes office which we will obtain.  I saw Randall Wilkins back today for hospital follow-up. He is reportedly doing much better. He has more energy and no significant chest discomfort. Between our last appointment see presented with a urinary tract infection and ultimately had chest pain and profuse diaphoresis. He was found to have elevated troponin and underwent cardiac catheterization. This demonstrated the following:  Coronary  angiography: Coronary dominance: right  Left mainstem: 95% distal left main stenosis.  Left anterior descending (LAD): There is 95% ostial LAD stenosis. The LAD is a large vessel. The LAD and diagonal otherwise are without significant disease.  There is a large ramus intermediate branch with 99% ostial stenosis.  Left circumflex (LCx): 100% ostial occlusion with faint left to left collaterals.  Right coronary artery (RCA): the RCA is a large dominant branch. The stent in the proximal vessel is widely patent. There is a 60% stenosis in the mid PDA. The third PLOM is moderate in size with a long 90-95% stenosis prior to distal arborization.    Subsequently he underwent emergent coronary artery bypass grafting with a LIMA to LAD and SVG to OM branch. He is noted to have mild aortic sclerosis versus stenosis, however the valve was not severe enough to be addressed. Since discharge he is more active and is starting cardiac rehabilitation today.  Randall Wilkins returns today for follow-up. Overall he is feeling well and continues to improve in his ambulation and stamina. He denies any chest pain or shortness of breath. He is rehabilitating well. Recent laboratory work shows excellent cholesterol control and total cholesterol 130, HDL 42 LDL 61 and triglycerides 137. His only concern is the cost of medications.  I saw Randall Wilkins back today in the office. He denies any chest pain or worsening shortness of breath. Unfortunately he's had about several weeks of diarrhea which started when he went to the [facility] area. It was not felt to be infectious any seeing a gastroenterologist about this. Otherwise he is without  cardiac complaints. Blood pressure today was low at 114/52. He's managed to lose only a couple of pounds but generally his appetite stools been pretty good despite the diarrhea. He is planning on having an upcoming injection and I received a request to hold his Plavix for 5 days prior to that. He  is currently holding it will have his injection on Monday. Based on this I reviewed his records and I do not see a clear ongoing indication for the Plavix in addition to aspirin. Now that he's been surgically revascularized.   03/04/2017  Randall Wilkins returns today for follow-up. Recently he's had some dizziness and apparently had lower blood pressure. He backed off on his lisinopril to 2.5 mg daily, or at least he says he is taking a half tablet. Will try to figure out what the dose was. EKG today shows sinus bradycardia with either a first degree AV block of great significance or possibly atrial fibrillation. It's difficult to see consistent P waves with normal PP intervals. The RR intervals are slightly irregular. He has had sinus with a long first-degree AV block of 340 ms in the past. This is important to determine as he has had prior stroke and would necessitate anticoagulation.  03/23/2018  Randall Wilkins returns today for follow-up.  He has no specific complaints.  He continues to have a little instability with his gait.  He is using a cane today but has a walker at home.  Recently he has had the best blood sugars that he has had in a while.  He started seeing Dr. Dwyane Dee, who added Victoza.  He is also on [place] and Humalog.  He was in a clinical trial with Jardiance, and is to be considered given his history of coronary disease and diabetes for mortality benefit.  I will defer to Dr. Dwyane Dee.  Blood pressure is well controlled today.  Weight remains excessive.  EKG personally reviewed shows sinus rhythm with PVCs for which he is unaware of.  02/07/2019  Randall Wilkins returns today for follow-up.  He is done well and has no new complaints.  Recently seen by Dr. Claiborne Billings and adjustments were made to his CPAP.  He is reportedly 100% compliant.  He was noted to have a type I winky block on EKG a couple weeks ago and his Toprol-XL was reduced from 37.5 to 25 mg.  He seems to be tolerating this however blood pressures  accordingly gone up.  Blood pressure today was 155/71 and home blood pressures are in the 150s.  03/13/2020  Randall Wilkins is seen today in follow-up.  He is accompanied by his wife.  He scheduled to see Dr. Claiborne Billings in a couple weeks.  Recently he has been struggling with shortness of breath and fatigue.  He saw his primary care provider and diagnosed with a pneumonia.  He has been on antibiotics and does seem to be improving.  He has somewhat of a productive cough.  An EKG was performed today and shows atrial fibrillation controlled ventricular response.  This is newly documented.  He does not have known history of this although had had a prior stroke.  He is on low-dose aspirin.  05/03/2020  Randall Wilkins is seen today in follow-up.  He just got [place] from the beach.  He remains in A. fib.  Fortunately his pneumonia is not resolving.  Repeat imaging still suggest multifocal infiltrates and his PCP apparently is going to refer him to pulmonary.  I suspect he may end up getting bronchoscopy  or other procedures.  This will complicate things if he were to have a cardioversion, particularly I would not want to stop his anticoagulation to allow for those procedures within a month after it.  At this point he remains in A. fib today but is rate controlled.  He has been fatigued again I think this is related his A. fib and his pneumonias.  He is essentially nonambulatory and difficult for his wife to push around.  They have asked about a powered wheelchair.  07/08/2020  Randall Wilkins returns today for follow-up.  He appears to be an sinus rhythm with Mobitz 1 AV block.  He had this previously and has had some intermittent A. fib however there is no indication for cardioversion.  He is anticoagulated on Eliquis.  His breathing is worsened and recently had a super D CT scan of the chest ordered by Dr. Lamonte Sakai.  Unfortunate this shows a large 5 cm cavitary lung mass in the right lower lobe as well as some persistent lung mass in the left upper  lobe.  Is not clear to me whether this is tumor versus a possible pneumonic process.  He also reported that they had had some remodeling done in her house and they found extensive amounts of mold in the kitchen.  02/06/2021  Randall Wilkins is seen today in the office for follow-up.  He has had some progressive dyspnea.  EKG today shows what appears to be a long first-degree AV block, probably sinus rhythm with a Mobitz 1.  Looking back he had an EKG in October which is read as A. fib but appears to be regular with a long first-degree AV block.  His initial diagnosis of A. fib I think came from me earlier last year with an EKG in the office that was questionable for A. fib but also probably showed a long first-degree AV block.  Nevertheless he remains on Eliquis 2.5 mg twice daily.  His dyspnea has progressed somewhat.  He saw pulmonary and was started on Spiriva which may have improved a little.  He also has extensive lung cancer which may be causing his dyspnea.  He has reported a little bit of leg swelling.  Echo last year showed an EF of 48% with mild to moderate aortic stenosis.  03/11/2021  Randall Wilkins is seen today for a cardiac clearance.  He is supposed to undergo periodontal surgery because apparently he has a cavity or abscess which may affect his bridge.  Apparently this is more urgent.  He would have to stop his Eliquis 2 days prior to the procedure and should start afterwards.  According to our pharmacist it was noted that his creatinine has improved slightly and is now less than 1.5 therefore he will qualify for the $Remov'5mg'cuZMEz$  twice daily dose.  He does note some improvement in shortness of breath which may have been due to some changes in his chemotherapy.  EKG today shows an atrial flutter at 73.  05/29/2021  Randall Wilkins returns today for hospitalization follow-up.  I was fortunate to be rounding in the hospital and took care of him while he was there.  He presented with decompensated heart failure and significant  weight gain.  He was diuresed under 100 kg and had marked improvement in his symptoms.  He was also noted to be in atrial flutter.  I did pursue elective cardioversion however he converted to what appeared to be atrial fibrillation.  He seemed to maintain this for a while however now today he is  back in atrial flutter again with controlled ventricular response.  According to his wife his weight has been going up.  He has become more short of breath requiring oxygen.  He cannot wear his CPAP because he feels smothered.  Weight is down 102 kg, up from 98 kg at discharge.  07/01/2021  Randall Wilkins is seen today in follow-up.  I had started him on metolazone however he had significant issues with worsening creatinine and volume contraction on this and it was discontinued.  He was then advised to go back to 40 mg twice daily Lasix.  Unfortunately there was some confusion he was only taking Lasix once a day.  He recently went back to twice daily dosing but weight had gone up to 238 pounds and currently is 236 pounds, this is about 10 to 12 pounds over his discharge weight (24 pounds).  BNP recently, however was only elevated at 142.  Creatinine is 1.73, with a GFR of 39.  08/29/2021  Randall Wilkins returns today for follow-up of his recent hospitalization.  I saw him in July and he had had some worsening heart failure.  I increased his diuretics and we tried metolazone however he was intolerant of this due to worsening renal function and potassium issues.  I placed him back on 80 mg Lasix twice daily and he seems to be maintaining his weight on this.  He has had atrial flutter which is rate controlled and successfully converted to sinus rhythm in the hospital but then reverted back the day later.  I do feel that the atrial flutter plays a role in his decompensation in addition the fact that he has some aortic stenosis.  It seems that his cancer has been well treated and he seems to be responding however according to his wife he has  had some progressive worsening of dementia which is more of an issue than anything.  PMHx:  Past Medical History:  Diagnosis Date   Aortic stenosis, mild 11/14/2013   Aortic valve sclerosis 03/29/2015   Bilateral leg edema 05/21/2014   CAD (coronary artery disease)    Chronic diastolic congestive heart failure (HCC)    Chronic kidney disease (CKD), stage III (moderate) (HCC)    Diabetes 1.5, managed as type 1 ([place]) 02/04/2013   Diabetic peripheral neuropathy associated with type 1 diabetes mellitus ([place]) 11/14/2013   Dysphagia    Dyspnea on exertion 03/21/2015   Exogenous obesity    Gout    Heart attack ([place])    History of nuclear stress test 07/2011   dipyridamole; fixed inferolateral defect, worse at stress than rest; no reversible ischemia; low risk scan    Hyperlipidemia    Hypertension    Hypothyroidism    Insulin dependent diabetes mellitus    Left foot drop    Left main coronary artery disease 03/28/2015   lung ca dx'd 08/2020   Macular pucker, right eye 01/29/2021   Vitrectomy membrane peel right eye 02-12-2021   Memory loss    Obesity (BMI 30-39.9) 11/14/2013   Obstructive sleep apnea 03/21/2015   OSA on CPAP    uses a cpap   Peptic ulcer with hemorrhage 03/28/2015   Peripheral neuropathy    Pneumonia    Rhabdomyolysis    S/P CABG x 2 03/29/2015   LIMA to Diagonal, SVG to OM, EVH via right thigh   Stroke ([place])    L patietal with small scattered lacunar infarcts   Thrombocytopenia ([place]) 03/21/2015   Venous insufficiency    Weakness  generalized 03/21/2015    Past Surgical History:  Procedure Laterality Date   BACK SURGERY  2002   lumbosacral. 11 back surgeries total   BRONCHIAL BIOPSY  08/06/2020   Procedure: BRONCHIAL BIOPSIES;  Surgeon: Collene Gobble, MD;  Location: [facility] ENDOSCOPY;  Service: Pulmonary;;   BRONCHIAL BRUSHINGS  08/06/2020   Procedure: BRONCHIAL BRUSHINGS;  Surgeon: Collene Gobble, MD;  Location: [facility] ENDOSCOPY;  Service: Pulmonary;;   BRONCHIAL NEEDLE ASPIRATION  BIOPSY  08/06/2020   Procedure: BRONCHIAL NEEDLE ASPIRATION BIOPSIES;  Surgeon: Collene Gobble, MD;  Location: [facility] ENDOSCOPY;  Service: Pulmonary;;   CARDIOVERSION N/A 05/06/2021   Procedure: CARDIOVERSION;  Surgeon: Geralynn Rile, MD;  Location: [place];  Service: Cardiovascular;  Laterality: N/A;   Carotid Doppler  03/2013   bilat bulb/prox ICAs - mild amount of fibrous plaque with no evidence of diameter reduction   CARPAL TUNNEL RELEASE Bilateral 08/09/2014   Procedure: BILATERAL CARPAL TUNNEL RELEASE;  Surgeon: Daryll Brod, MD;  Location: [place];  Service: Orthopedics;  Laterality: Bilateral;  ANESTHESIA:  IV REGIONAL BIL FAB   CHOLECYSTECTOMY     COLONOSCOPY     CORONARY ANGIOPLASTY  10/13/1996   CORONARY ANGIOPLASTY  09/21/1989   emergency PTCA   CORONARY ANGIOPLASTY  10/13/1996   Multi-Link diagonal & OD stenting (Dr. Marella Chimes)   [place]  12/03/1997   disease of mid DX-1 ~50% & in mid PLA & PDA (distal lesions) (Dr. Marella Chimes)    CORONARY ANGIOPLASTY  10/14/1999   progression of disease distal PLA & PDA; progression of disease prox RCA - moderate (Dr. Marella Chimes)    CORONARY ANGIOPLASTY WITH STENT PLACEMENT  04/04/2004   4.0x37mm non-DES (thrombectomy via AngioJet) to RCA for high grade stenosis (Dr. Marella Chimes)   CORONARY ARTERY BYPASS GRAFT N/A 03/29/2015   Procedure: CORONARY ARTERY BYPASS GRAFTING TIMES TWO USING LEFT INTERNAL MAMMARY ARTERY AND RIGHT LEG GREATER SAPHENOUS VEIN HARVESTED ENDOSCOPICALLY.;  Surgeon: Rexene Alberts, MD;  Location: [place];  Service: Open Heart Surgery;  Laterality: N/A;   ESOPHAGOGASTRODUODENOSCOPY N/A 03/27/2015   Procedure: ESOPHAGOGASTRODUODENOSCOPY (EGD);  Surgeon: Clarene Essex, MD;  Location: [facility] ENDOSCOPY;  Service: Endoscopy;  Laterality: N/A;  possible dilation   ESOPHAGOGASTRODUODENOSCOPY N/A 10/05/2020   Procedure: ESOPHAGOGASTRODUODENOSCOPY (EGD);  Surgeon: Arta Silence, MD;   Location: [facility] ENDOSCOPY;  Service: Endoscopy;  Laterality: N/A;   EYE SURGERY Right    macular fold-02/2021   FINE NEEDLE ASPIRATION  08/06/2020   Procedure: FINE NEEDLE ASPIRATION (FNA) LINEAR;  Surgeon: Collene Gobble, MD;  Location: [place] ENDOSCOPY;  Service: Pulmonary;;   IR IMAGING GUIDED PORT INSERTION  10/07/2020   LEFT HEART CATHETERIZATION WITH CORONARY ANGIOGRAM N/A 03/28/2015   Procedure: LEFT HEART CATHETERIZATION WITH CORONARY ANGIOGRAM;  Surgeon: Peter M Martinique, MD;  Location: [facility] CATH LAB;  Service: Cardiovascular;  Laterality: N/A;   SINUS ENDO W/FUSION     TEE WITHOUT CARDIOVERSION N/A 05/06/2021   Procedure: TRANSESOPHAGEAL ECHOCARDIOGRAM (TEE);  Surgeon: Geralynn Rile, MD;  Location: [place];  Service: Cardiovascular;  Laterality: N/A;   TONSILLECTOMY     TRANSTHORACIC ECHOCARDIOGRAM  08/08/2013   EF 55-60%, mild conc hypertrophy, grade 1 diastolic dysfunction; AV with mild stenosis; LA & RA mildly dilated   VIDEO BRONCHOSCOPY WITH ENDOBRONCHIAL NAVIGATION N/A 08/06/2020   Procedure: VIDEO BRONCHOSCOPY WITH ENDOBRONCHIAL NAVIGATION;  Surgeon: Collene Gobble, MD;  Location: [place] ENDOSCOPY;  Service: Pulmonary;  Laterality: N/A;   VIDEO BRONCHOSCOPY WITH ENDOBRONCHIAL ULTRASOUND N/A 08/06/2020  Procedure: VIDEO BRONCHOSCOPY WITH ENDOBRONCHIAL ULTRASOUND;  Surgeon: Collene Gobble, MD;  Location: [facility] ENDOSCOPY;  Service: Pulmonary;  Laterality: N/A;    FAMHx:  Family History  Problem Relation Age of Onset   Heart disease Mother    Coronary artery disease Father    Cancer Maternal Grandmother    Heart Problems Maternal Grandfather    Diabetes Son        borderline     SOCHx:   reports that he quit smoking about 47 years ago. His smoking use included cigarettes. He has a 40.00 pack-year smoking history. He has never used smokeless tobacco. He reports that he does not drink alcohol and does not use drugs.  ALLERGIES:  Allergies  Allergen Reactions   Actos  [Pioglitazone] Swelling   Spironolactone Itching   Metformin And Related Nausea Only    3.16.2022 pt is currently taking this medication at home   Niaspan [Niacin Er] Itching and Rash   Wound Dressing Adhesive Rash    ROS: Pertinent items noted in HPI and remainder of comprehensive ROS otherwise negative.  HOME MEDS: Current Outpatient Medications  Medication Sig Dispense Refill   allopurinol (ZYLOPRIM) 100 MG tablet TAKE ONE TABLET EVERY DAY 30 tablet 6   amitriptyline (ELAVIL) 50 MG tablet Take 50 mg by mouth at bedtime.     amLODipine (NORVASC) 10 MG tablet TAKE ONE TABLET BY MOUTH ONCE DAILY 90 tablet 3   apixaban (ELIQUIS) 5 MG TABS tablet Take 1 tablet (5 mg total) by mouth 2 (two) times daily. 60 tablet 11   Blood Glucose Monitoring Suppl (CONTOUR NEXT MONITOR) w/Device KIT 1 Device by Does not apply route 3 (three) times daily. Use to check blood sugars 3 times daily. Dx Code E13.9 1 kit 2   colchicine 0.6 MG tablet Take 0.6 mg by mouth 3 (three) times daily as needed.     CONTOUR NEXT TEST test strip USE 1 STRIP TO CHECK GLUCOSE 4 TIMES DAILY 300 each 0   furosemide (LASIX) 40 MG tablet Take 2 tablets (80 mg total) by mouth 2 (two) times daily. 180 tablet 1   insulin degludec (TRESIBA FLEXTOUCH) 200 UNIT/ML FlexTouch Pen Inject 12 Units into the skin at bedtime. (Patient taking differently: Inject 6 Units into the skin at bedtime.)     insulin lispro (HUMALOG) 100 UNIT/ML cartridge Inject 5-15 Units into the skin 3 (three) times daily with meals. Sliding scale.     Insulin Pen Needle (EASY COMFORT PEN NEEDLES) 33G X 4 MM MISC 1 each by Does not apply route See admin instructions. Use to inject insulin 5 times daily. 150 each 3   isosorbide dinitrate (ISORDIL) 20 MG tablet Take 1 tablet (20 mg total) by mouth in the morning and at bedtime. 180 tablet 3   levothyroxine (SYNTHROID, LEVOTHROID) 75 MCG tablet Take 75 mcg by mouth daily before breakfast.   1   lidocaine-prilocaine  (EMLA) cream APPLY A TEASPOON OVER PORT SITE AT LEASTONE HOUR BEFORE LAB APPOINTMENT. DO NOT RUB IN AND COVER WITH PLASTIC WRAP (Patient taking differently: Apply 1 application topically See admin instructions. Apply a teaspoon over port site at least one hour before lab appointment. Do not rub in and cover with plastic wrap.) 30 g 0   lisinopril (ZESTRIL) 10 MG tablet Take 1 tablet (10 mg total) by mouth daily. 90 tablet 3   ondansetron (ZOFRAN) 4 MG tablet Take 1 tablet (4 mg total) by mouth every 6 (six) hours as needed for  nausea. 20 tablet 0   OXYGEN Inhale 2 L into the lungs. At bedtime and during the day prn     potassium chloride SA (K-DUR,KLOR-CON) 20 MEQ tablet Take 20 mEq by mouth 2 (two) times daily.      prochlorperazine (COMPAZINE) 10 MG tablet Take 1 tablet (10 mg total) by mouth every 6 (six) hours as needed for nausea or vomiting. 30 tablet 0   rosuvastatin (CRESTOR) 20 MG tablet Take 20 mg by mouth daily.     sotorasib 120 MG TABS TAKE 960 MG BY MOUTH EVERY MORNING. (Patient taking differently: Take 960 mg by mouth daily.) 240 tablet 3   Tamsulosin HCl (FLOMAX) 0.4 MG CAPS Take 0.4 mg by mouth at bedtime.     vitamin B-12 (CYANOCOBALAMIN) 1000 MCG tablet Take 1,000 mcg by mouth at bedtime.     hydrALAZINE (APRESOLINE) 25 MG tablet Take 1.5 tablets (37.5 mg total) by mouth in the morning and at bedtime. 270 tablet 3   No current facility-administered medications for this visit.    LABS/IMAGING: No results found for this or any previous visit (from the past 48 hour(s)). No results found.  VITALS: BP (!) 131/57   Pulse 69   Ht $R'5\' 7"'Wd$  (1.702 m)   Wt 221 lb 9.6 oz (100.5 kg)   SpO2 94%   BMI 34.71 kg/m   EXAM: General appearance: alert, no distress and in wheelchair Neck: JVD - 3 cm above sternal notch, no carotid bruit and thyroid not enlarged, symmetric, no tenderness/mass/nodules Lungs: diminished breath sounds bibasilar and rales bibasilar Heart: irregularly irregular  rhythm Abdomen: soft, non-tender; bowel sounds normal; no masses,  no organomegaly Extremities: edema 2-3+ RLE and LLE in a brace Pulses: 2+ and symmetric Skin: Skin color, texture, turgor normal. No rashes or lesions Neurologic: Grossly normal Psych: Pleasant  EKG: Atrial flutter with variable AV response and PVCs at 69, nonspecific ST-T wave changes-personally reviewed  ASSESSMENT: Acute on chronic combined systolic and diastolic congestive heart failure, LVEF 45 to 50% (04/2021) Paroxysmal atrial fibrillation/flutter - CHADSVASC score of 6 Lung Cancer Recent multifocal pneumonia Coronary artery disease status post BMS to the RCA in 2005, recent CABG 2 (LIMA to LAD - for critical left main disease, SVG to OM1)- 2016 Obesity Hypertension-controlled Dyslipidemia Prior stroke Diabetic neuropathy Diabetic nephropathy - left foot drop Type 1 diabetes on insulin LE edema Mild aortic stenosis LVEF 48% with mild to moderate AS (03/2020)  PLAN: 1.   Randall Wilkins seems to be stable with class II-III heart failure symptoms.  His last echo showed LVEF between 45 to 55%.  He did convert successfully with a TEE guided cardioversion to sinus rhythm however did not maintain that.  It may be contributing to his heart failure.  Because it is a typical flutter, I would recommend evaluation by EP for possible ablation or if not a candidate perhaps antiarrhythmic therapy and repeat cardioversions an option.  He has aortic stenosis which is mild to moderate.  We will continue to follow this.  No changes to his diuretic dose at this time.  Follow-up with me in 6 months or sooner as necessary.  Pixie Casino, MD, [facility], [place] Director of the Advanced Lipid Disorders &  Cardiovascular Risk Reduction Clinic Diplomate of the American Board of Clinical Lipidology Attending Cardiologist  Direct Dial: [phone]  Fax: [phone]  Website:   [email or web address] 08/29/2021, 11:27 AM

## 2021-09-02 ENCOUNTER — Encounter: Payer: Self-pay | Admitting: Cardiology

## 2021-09-02 ENCOUNTER — Telehealth: Payer: Self-pay | Admitting: Cardiology

## 2021-09-02 ENCOUNTER — Ambulatory Visit: Payer: Medicare Other | Admitting: Cardiology

## 2021-09-02 ENCOUNTER — Other Ambulatory Visit: Payer: Self-pay

## 2021-09-02 VITALS — BP 134/70 | HR 67 | Ht 67.0 in | Wt 222.4 lb

## 2021-09-02 DIAGNOSIS — I480 Paroxysmal atrial fibrillation: Secondary | ICD-10-CM | POA: Diagnosis not present

## 2021-09-02 MED ORDER — AMIODARONE HCL 200 MG PO TABS: 200.0000 mg | ORAL_TABLET | Freq: Every day | ORAL | 0 refills | Status: DC

## 2021-09-02 MED ORDER — AMIODARONE HCL 200 MG PO TABS: 200.0000 mg | ORAL_TABLET | Freq: Every day | ORAL | 3 refills | Status: DC

## 2021-09-02 NOTE — Patient Instructions (Addendum)
Medication Instructions:  Your physician has recommended you make the following change in your medication:  START Amiodarone  - take 2 tablets (400 mg total) TWICE a day for 2 weeks, then  - take 1 tablet (200 mg total) TWICE a day for 2 weeks, then  - take 1 tablet (200 mg total) ONCE a day   *If you need a refill on your cardiac medications before your next appointment, please call your pharmacy*   Lab Work: None ordered If you have labs (blood work) drawn today and your tests are completely normal, you will receive your results only by: MyChart Message (if you have MyChart) OR A paper copy in the mail If you have any lab test that is abnormal or we need to change your treatment, we will call you to review the results.   Testing/Procedures: None ordered   Follow-Up: At [facility], you and your health needs are our priority.  As part of our continuing mission to provide you with exceptional heart care, we have created designated Provider Care Teams.  These Care Teams include your primary Cardiologist (physician) and Advanced Practice Providers (APPs -  Physician Assistants and Nurse Practitioners) who all work together to provide you with the care you need, when you need it.  We recommend signing up for the patient portal called "MyChart".  Sign up information is provided on this After Visit Summary.  MyChart is used to connect with patients for Virtual Visits (Telemedicine).  Patients are able to view lab/test results, encounter notes, upcoming appointments, etc.  Non-urgent messages can be sent to your provider as well.   To learn more about what you can do with MyChart, go to [email or web address].    Your next appointment:   2 week(s)  The format for your next appointment:   In Person  Provider:   You will follow up in the [facility] located at [facility]. Your provider will be: Roderic Palau, NP or Clint R. Fenton, PA-C   Your physician  recommends that you schedule a follow-up appointment in: 3 months with Dr. Curt Bears.  Thank you for choosing [facility]!!   Trinidad Curet, RN [phone]   Other Instructions  Amiodarone Tablets What is this medication? AMIODARONE (a MEE oh da rone) prevents and treats a fast or irregular heartbeat (arrhythmia). It works by slowing down overactive electric signals in the heart, which stabilizes your heart rhythm. It belongs to a group of medications called antiarrhythmics. This medicine may be used for other purposes; ask your health care provider or pharmacist if you have questions. COMMON BRAND NAME(S): Cordarone, Pacerone What should I tell my care team before I take this medication? They need to know if you have any of these conditions: Liver disease Lung disease Other heart problems Thyroid disease An unusual or allergic reaction to amiodarone, iodine, other medications, foods, dyes, or preservatives Pregnant or trying to get pregnant Breast-feeding How should I use this medication? Take this medication by mouth with a glass of water. Follow the directions on the prescription label. You can take this medication with or without food. However, you should always take it the same way each time. Take your doses at regular intervals. Do not take your medication more often than directed. Do not stop taking except on the advice of your care team. A special MedGuide will be given to you by the pharmacist with each prescription and refill. Be sure to read this information carefully each time. Talk to  your care team regarding the use of this medication in children. Special care may be needed. Overdosage: If you think you have taken too much of this medicine contact a poison control center or emergency room at once. NOTE: This medicine is only for you. Do not share this medicine with others. What if I miss a dose? If you miss a dose, take it as soon as you can. If it is almost time for  your next dose, take only that dose. Do not take double or extra doses. What may interact with this medication? Do not take this medication with any of the following: Abarelix Apomorphine Arsenic trioxide Certain antibiotics like erythromycin, gemifloxacin, levofloxacin, pentamidine Certain medications for depression like amoxapine, tricyclic antidepressants Certain medications for fungal infections like fluconazole, itraconazole, ketoconazole, posaconazole, voriconazole Certain medications for irregular heartbeat like disopyramide, dronedarone, ibutilide, propafenone, sotalol Certain medications for malaria like chloroquine, halofantrine Cisapride Droperidol Haloperidol [place] Maprotiline Methadone Phenothiazines like chlorpromazine, mesoridazine, thioridazine Pimozide Ranolazine Red yeast rice Vardenafil This medication may also interact with the following: Antiviral medications for HIV or AIDS Certain medications for blood pressure, heart disease, irregular heart beat Certain medications for cholesterol like atorvastatin, cerivastatin, lovastatin, simvastatin Certain medications for hepatitis C like sofosbuvir and ledipasvir; sofosbuvir Certain medications for seizures like phenytoin Certain medications for thyroid problems Certain medications that treat or prevent blood clots like warfarin Cholestyramine Cimetidine Clopidogrel Cyclosporine Dextromethorphan Diuretics Dofetilide Fentanyl General anesthetics Grapefruit juice Lidocaine Loratadine Methotrexate Other medications that prolong the QT interval (cause an abnormal heart rhythm) Procainamide Quinidine Rifabutin, rifampin, or rifapentine St. John's Wort Trazodone Ziprasidone This list may not describe all possible interactions. Give your health care provider a list of all the medicines, herbs, non-prescription drugs, or dietary supplements you use. Also tell them if you smoke, drink alcohol, or use illegal  drugs. Some items may interact with your medicine. What should I watch for while using this medication? Your condition will be monitored closely when you first begin therapy. Often, this medication is first started in a hospital or other monitored health care setting. Once you are on maintenance therapy, visit your care team for regular checks on your progress. Because your condition and use of this medication carry some risk, it is a good idea to carry an identification card, necklace or bracelet with details of your condition, medications, and care team. You may get drowsy or dizzy. Do not drive, use machinery, or do anything that needs mental alertness until you know how this medication affects you. Do not stand or sit up quickly, especially if you are an older patient. This reduces the risk of dizzy or fainting spells. This medication can make you more sensitive to the sun. Keep out of the sun. If you cannot avoid being in the sun, wear protective clothing and use sunscreen. Do not use sun lamps or tanning beds/booths. You should have regular eye exams before and during treatment. Call your care team if you have blurred vision, see halos, or your eyes become sensitive to light. Your eyes may get dry. It may be helpful to use a lubricating eye solution or artificial tears solution. If you are going to have surgery or a procedure that requires contrast dyes, tell your care team that you are taking this medication. What side effects may I notice from receiving this medication? Side effects that you should report to your care team as soon as possible: Allergic reactions-skin rash, itching, hives, swelling of the face, lips, tongue, or throat Bluish-gray  skin Change in vision such as blurry vision, seeing halos around lights, vision loss Heart failure-shortness of breath, swelling of the ankles, feet, or hands, sudden weight gain, unusual weakness or fatigue Heart rhythm changes-fast or irregular  heartbeat, dizziness, feeling faint or lightheaded, chest pain, trouble breathing High thyroid levels (hyperthyroidism)-fast or irregular heartbeat, weight loss, excessive sweating or sensitivity to heat, tremors or shaking, anxiety, nervousness, irregular menstrual cycle or spotting Liver injury-right upper belly pain, loss of appetite, nausea, light-colored stool, dark yellow or brown urine, yellowing skin or eyes, unusual weakness or fatigue Low thyroid levels (hypothyroidism)-unusual weakness or fatigue, sensitivity to cold, constipation, hair loss, dry skin, weight gain, feelings of depression Lung injury-shortness of breath or trouble breathing, cough, spitting up blood, chest pain, fever Pain, tingling, or numbness in the hands or feet, muscle weakness, trouble walking, loss of balance or coordination Side effects that usually do not require medical attention (report to your care team if they continue or are bothersome): Nausea Vomiting This list may not describe all possible side effects. Call your doctor for medical advice about side effects. You may report side effects to FDA at 1-800-FDA-1088. Where should I keep my medication? Keep out of the reach of children and pets. Store at room temperature between 20 and 25 degrees C (68 and 77 degrees F). Protect from light. Keep container tightly closed. Throw away any unused medication after the expiration date. NOTE: This sheet is a summary. It may not cover all possible information. If you have questions about this medicine, talk to your doctor, pharmacist, or health care provider.  2022 [employer]/Gold Standard (2021-01-02 11:04:04)

## 2021-09-02 NOTE — Telephone Encounter (Signed)
Amitriptyline is an intermediate QTc prolonging agent. Baseline QTc 379. Dr. Curt Bears- are you ok with with interaction with close EKG follow up?

## 2021-09-02 NOTE — Telephone Encounter (Signed)
Pt c/o medication issue:  1. Name of Medication:  amiodarone (PACERONE) 200 MG tablet amitriptyline (ELAVIL) 50 MG tablet  2. How are you currently taking this medication (dosage and times per day)? Has not started amiodarone, amitriptyline 1 tablet daily  3. Are you having a reaction (difficulty breathing--STAT)? no  4. What is your medication issue? Patient's wife states the pharmacy told them the medications may interact.

## 2021-09-02 NOTE — Telephone Encounter (Signed)
Wife made aware to not start Amiodarone until hearing back from [place] once MD reviews and advised.  She  verbalized understanding and agreeable to plan.

## 2021-09-02 NOTE — Progress Notes (Signed)
Electrophysiology Office Note   Date:  09/02/2021   ID:  Randall Wilkins, DOB August 14, 1941, MRN [id number]  PCP:  [name], MD  Cardiologist:  Debara Pickett Primary Electrophysiologist:  Pola Furno Meredith Leeds, MD    Chief Complaint: AF   History of Present Illness: Randall Wilkins is a 80 y.o. male who is being seen today for the evaluation of AF at the request of Hilty, Nadean Corwin, MD. Presenting today for electrophysiology evaluation.  He has a history significant for obesity, insulin-dependent diabetes, peripheral neuropathy, diabetic nephropathy, venous insufficiency, and peripheral neuropathy.  He also has obstructive sleep apnea on CPAP and coronary artery disease.  He had a left parietal stroke with small lacunar infarcts on MRI in 2013.  He has a drug-eluting stent placed to his RCA in 2005.  He is currently on both Lasix and metolazone for edema.  He had a recent cardioversion for atrial flutter.  Today, he denies symptoms of palpitations, chest pain, shortness of breath, orthopnea, PND, lower extremity edema, claudication, dizziness, presyncope, syncope, bleeding, or neurologic sequela. The patient is tolerating medications without difficulties.  When he is in atrial fibrillation, he has significant weakness, fatigue, and shortness of breath.  When he is in normal rhythm, just after his cardioversions, he feels much improved.  He would like to do something to get back into normal rhythm.  Unfortunately, he does have memory issues as well as his chronic medical issues.  His wife does not wish him to be admitted to the hospital it can be avoided.   Past Medical History:  Diagnosis Date   Aortic stenosis, mild 11/14/2013   Aortic valve sclerosis 03/29/2015   Bilateral leg edema 05/21/2014   CAD (coronary artery disease)    Chronic diastolic congestive heart failure (HCC)    Chronic kidney disease (CKD), stage III (moderate) (HCC)    Diabetes 1.5, managed as type 1 ([place]) 02/04/2013    Diabetic peripheral neuropathy associated with type 1 diabetes mellitus ([place]) 11/14/2013   Dysphagia    Dyspnea on exertion 03/21/2015   Exogenous obesity    Gout    Heart attack ([place])    History of nuclear stress test 07/2011   dipyridamole; fixed inferolateral defect, worse at stress than rest; no reversible ischemia; low risk scan    Hyperlipidemia    Hypertension    Hypothyroidism    Insulin dependent diabetes mellitus    Left foot drop    Left main coronary artery disease 03/28/2015   lung ca dx'd 08/2020   Macular pucker, right eye 01/29/2021   Vitrectomy membrane peel right eye 02-12-2021   Memory loss    Obesity (BMI 30-39.9) 11/14/2013   Obstructive sleep apnea 03/21/2015   OSA on CPAP    uses a cpap   Peptic ulcer with hemorrhage 03/28/2015   Peripheral neuropathy    Pneumonia    Rhabdomyolysis    S/P CABG x 2 03/29/2015   LIMA to Diagonal, SVG to OM, EVH via right thigh   Stroke ([place])    L patietal with small scattered lacunar infarcts   Thrombocytopenia ([place]) 03/21/2015   Venous insufficiency    Weakness generalized 03/21/2015   Past Surgical History:  Procedure Laterality Date   BACK SURGERY  2002   lumbosacral. 11 back surgeries total   BRONCHIAL BIOPSY  08/06/2020   Procedure: BRONCHIAL BIOPSIES;  Surgeon: Collene Gobble, MD;  Location: [facility] ENDOSCOPY;  Service: Pulmonary;;   BRONCHIAL BRUSHINGS  08/06/2020   Procedure: BRONCHIAL  BRUSHINGS;  Surgeon: Collene Gobble, MD;  Location: [facility] ENDOSCOPY;  Service: Pulmonary;;   BRONCHIAL NEEDLE ASPIRATION BIOPSY  08/06/2020   Procedure: BRONCHIAL NEEDLE ASPIRATION BIOPSIES;  Surgeon: Collene Gobble, MD;  Location: [facility] ENDOSCOPY;  Service: Pulmonary;;   CARDIOVERSION N/A 05/06/2021   Procedure: CARDIOVERSION;  Surgeon: Geralynn Rile, MD;  Location: [facility];  Service: Cardiovascular;  Laterality: N/A;   Carotid Doppler  03/2013   bilat bulb/prox ICAs - mild amount of fibrous plaque with no evidence of diameter reduction    CARPAL TUNNEL RELEASE Bilateral 08/09/2014   Procedure: BILATERAL CARPAL TUNNEL RELEASE;  Surgeon: Daryll Brod, MD;  Location: [facility];  Service: Orthopedics;  Laterality: Bilateral;  ANESTHESIA:  IV REGIONAL BIL FAB   CHOLECYSTECTOMY     COLONOSCOPY     CORONARY ANGIOPLASTY  10/13/1996   CORONARY ANGIOPLASTY  09/21/1989   emergency PTCA   CORONARY ANGIOPLASTY  10/13/1996   Multi-Link diagonal & OD stenting (Dr. Marella Chimes)   [place]  12/03/1997   disease of mid DX-1 ~50% & in mid PLA & PDA (distal lesions) (Dr. Marella Chimes)    CORONARY ANGIOPLASTY  10/14/1999   progression of disease distal PLA & PDA; progression of disease prox RCA - moderate (Dr. Marella Chimes)    CORONARY ANGIOPLASTY WITH STENT PLACEMENT  04/04/2004   4.0x63m non-DES (thrombectomy via AngioJet) to RCA for high grade stenosis (Dr. RMarella Chimes   CORONARY ARTERY BYPASS GRAFT N/A 03/29/2015   Procedure: CORONARY ARTERY BYPASS GRAFTING TIMES TWO USING LEFT INTERNAL MAMMARY ARTERY AND RIGHT LEG GREATER SAPHENOUS VEIN HARVESTED ENDOSCOPICALLY.;  Surgeon: CRexene Alberts MD;  [place] [facility]  Service: Open Heart Surgery;  Laterality: N/A;   ESOPHAGOGASTRODUODENOSCOPY N/A 03/27/2015   Procedure: ESOPHAGOGASTRODUODENOSCOPY (EGD);  Surgeon: MClarene Essex MD;  [facility];  Service: Endoscopy;  Laterality: N/A;  possible dilation   ESOPHAGOGASTRODUODENOSCOPY N/A 10/05/2020   Procedure: ESOPHAGOGASTRODUODENOSCOPY (EGD);  Surgeon: OArta Silence MD;  [facility];  Service: Endoscopy;  Laterality: N/A;   EYE SURGERY Right    macular fold-02/2021   FINE NEEDLE ASPIRATION  08/06/2020   Procedure: FINE NEEDLE ASPIRATION (FNA) LINEAR;  Surgeon: BCollene Gobble MD;  [facility];  Service: Pulmonary;;   IR IMAGING GUIDED PORT INSERTION  10/07/2020   LEFT HEART CATHETERIZATION WITH CORONARY ANGIOGRAM N/A 03/28/2015   Procedure: LEFT HEART CATHETERIZATION WITH CORONARY  ANGIOGRAM;  Surgeon: Peter M JMartinique MD;  [facility];  Service: Cardiovascular;  Laterality: N/A;   SINUS ENDO W/FUSION     TEE WITHOUT CARDIOVERSION N/A 05/06/2021   Procedure: TRANSESOPHAGEAL ECHOCARDIOGRAM (TEE);  Surgeon: OGeralynn Rile MD;  [facility]  Service: Cardiovascular;  Laterality: N/A;   TONSILLECTOMY     TRANSTHORACIC ECHOCARDIOGRAM  08/08/2013   EF 55-60%, mild conc hypertrophy, grade 1 diastolic dysfunction; AV with mild stenosis; LA & RA mildly dilated   VIDEO BRONCHOSCOPY WITH ENDOBRONCHIAL NAVIGATION N/A 08/06/2020   Procedure: VIDEO BRONCHOSCOPY WITH ENDOBRONCHIAL NAVIGATION;  Surgeon: BCollene Gobble MD;  [facility];  Service: Pulmonary;  Laterality: N/A;   VIDEO BRONCHOSCOPY WITH ENDOBRONCHIAL ULTRASOUND N/A 08/06/2020   Procedure: VIDEO BRONCHOSCOPY WITH ENDOBRONCHIAL ULTRASOUND;  Surgeon: BCollene Gobble MD;  [facility];  Service: Pulmonary;  Laterality: N/A;     Current Outpatient Medications  Medication Sig Dispense Refill   allopurinol (ZYLOPRIM) 100 MG tablet TAKE ONE TABLET EVERY DAY 30 tablet 6   amitriptyline (ELAVIL) 50 MG tablet Take  50 mg by mouth at bedtime.     amLODipine (NORVASC) 10 MG tablet TAKE ONE TABLET BY MOUTH ONCE DAILY 90 tablet 3   apixaban (ELIQUIS) 5 MG TABS tablet Take 1 tablet (5 mg total) by mouth 2 (two) times daily. 60 tablet 11   Blood Glucose Monitoring Suppl (CONTOUR NEXT MONITOR) w/Device KIT 1 Device by Does not apply route 3 (three) times daily. Use to check blood sugars 3 times daily. Dx Code E13.9 1 kit 2   colchicine 0.6 MG tablet Take 0.6 mg by mouth 3 (three) times daily as needed.     CONTOUR NEXT TEST test strip USE 1 STRIP TO CHECK GLUCOSE 4 TIMES DAILY 300 each 0   furosemide (LASIX) 40 MG tablet Take 2 tablets (80 mg total) by mouth 2 (two) times daily. 180 tablet 1   insulin degludec (TRESIBA FLEXTOUCH) 200 UNIT/ML FlexTouch Pen Inject 12 Units into the skin at  bedtime. (Patient taking differently: Inject 6 Units into the skin at bedtime.)     insulin lispro (HUMALOG) 100 UNIT/ML cartridge Inject 5-15 Units into the skin 3 (three) times daily with meals. Sliding scale.     Insulin Pen Needle (EASY COMFORT PEN NEEDLES) 33G X 4 MM MISC 1 each by Does not apply route See admin instructions. Use to inject insulin 5 times daily. 150 each 3   isosorbide dinitrate (ISORDIL) 20 MG tablet Take 1 tablet (20 mg total) by mouth in the morning and at bedtime. 180 tablet 3   levothyroxine (SYNTHROID, LEVOTHROID) 75 MCG tablet Take 75 mcg by mouth daily before breakfast.   1   lidocaine-prilocaine (EMLA) cream APPLY A TEASPOON OVER PORT SITE AT LEASTONE HOUR BEFORE LAB APPOINTMENT. DO NOT RUB IN AND COVER WITH PLASTIC WRAP (Patient taking differently: Apply 1 application topically See admin instructions. Apply a teaspoon over port site at least one hour before lab appointment. Do not rub in and cover with plastic wrap.) 30 g 0   lisinopril (ZESTRIL) 10 MG tablet Take 1 tablet (10 mg total) by mouth daily. 90 tablet 3   ondansetron (ZOFRAN) 4 MG tablet Take 1 tablet (4 mg total) by mouth every 6 (six) hours as needed for nausea. 20 tablet 0   OXYGEN Inhale 2 L into the lungs. At bedtime and during the day prn     potassium chloride SA (K-DUR,KLOR-CON) 20 MEQ tablet Take 20 mEq by mouth 2 (two) times daily.      prochlorperazine (COMPAZINE) 10 MG tablet Take 1 tablet (10 mg total) by mouth every 6 (six) hours as needed for nausea or vomiting. 30 tablet 0   rosuvastatin (CRESTOR) 20 MG tablet Take 20 mg by mouth daily.     sotorasib 120 MG TABS TAKE 960 MG BY MOUTH EVERY MORNING. (Patient taking differently: Take 960 mg by mouth daily.) 240 tablet 3   Tamsulosin HCl (FLOMAX) 0.4 MG CAPS Take 0.4 mg by mouth at bedtime.     vitamin B-12 (CYANOCOBALAMIN) 1000 MCG tablet Take 1,000 mcg by mouth at bedtime.     hydrALAZINE (APRESOLINE) 25 MG tablet Take 1.5 tablets (37.5 mg  total) by mouth in the morning and at bedtime. 270 tablet 3   No current facility-administered medications for this visit.    Allergies:   Actos [pioglitazone], Spironolactone, Metformin and related, Niaspan [niacin er], and Wound dressing adhesive   Social History:  The patient  reports that he quit smoking about 47 years ago. His smoking use included cigarettes.  He has a 40.00 pack-year smoking history. He has never used smokeless tobacco. He reports that he does not drink alcohol and does not use drugs.   Family History:  The patient's family history includes Cancer in his maternal grandmother; Coronary artery disease in his father; Diabetes in his son; Heart Problems in his maternal grandfather; Heart disease in his mother.    ROS:  Please see the history of present illness.   Otherwise, review of systems is positive for none.   All other systems are reviewed and negative.    PHYSICAL EXAM: VS:  BP 134/70   Pulse 67   Ht _0  (1.702 m)   Wt 222 lb 6.4 oz (100.9 kg)   SpO2 92% Comment: with 2L 02  BMI 34.83 kg/m  , BMI Body mass index is 34.83 kg/m. GEN: Well nourished, well developed, in no acute distress  HEENT: normal  Neck: no JVD, carotid bruits, or masses Cardiac: Regular rhythm, 2 out of 6 systolic murmur at the base, no rubs, or gallops,no edema  Respiratory:  clear to auscultation bilaterally, normal work of breathing GI: soft, nontender, nondistended, + BS MS: no deformity or atrophy  Skin: warm and dry Neuro:  Strength and sensation are intact Psych: euthymic mood, full affect  EKG:  EKG is not ordered today. Personal review of the ekg ordered 08/29/21 shows atypical atrial flutter  Recent Labs: 05/02/2021: TSH 3.727 05/05/2021: Magnesium 2.0 06/11/2021: BNP 142.3 08/20/2021: ALT <6; BUN 21; Creatinine 1.63; Hemoglobin 9.6; Platelet Count 87; Potassium 3.7; Sodium 142    Lipid Panel     Component Value Date/Time   CHOL 94 01/06/2021 0912   TRIG 109.0  01/06/2021 0912   HDL 36.20 (L) 01/06/2021 0912   CHOLHDL 3 01/06/2021 0912   VLDL 21.8 01/06/2021 0912   LDLCALC 36 01/06/2021 0912     Wt Readings from Last 3 Encounters:  09/02/21 222 lb 6.4 oz (100.9 kg)  08/29/21 221 lb 9.6 oz (100.5 kg)  08/20/21 219 lb (99.3 kg)      Other studies Reviewed: Additional studies/ records that were reviewed today include: TTE 05/04/21  Review of the above records today demonstrates:   1. Left ventricular ejection fraction, by estimation, is 45 to 50%. Left  ventricular ejection fraction by PLAX is 45 %. The left ventricle has  mildly decreased function. There is mild left ventricular hypertrophy.  Left ventricular diastolic function  could not be evaluated.   2. The aortic valve is tricuspid. Moderate aortic valve stenosis. Aortic  valve area, by VTI measures 1.18 cm. Aortic valve mean gradient measures  17.5 mmHg. Aortic valve Vmax measures 2.82 m/s. DI is 0.38.   3. There is severely elevated pulmonary artery systolic pressure.   4. Tricuspid valve regurgitation is mild to moderate.   5. The inferior vena cava is normal in size with <50% respiratory  variability, suggesting right atrial pressure of 8 mmHg.   ASSESSMENT AND PLAN:  1.  Paroxysmal atrial fibrillation/flutter: CHA2DS2-VASc 6.  Currently on Eliquis 5 mg twice daily.  He has had multiple cardioversions in the past.  Unfortunately due to his multiple chronic medical issues disease, kidney disease, dementia, prior stroke, among others, I do not feel that he would be a good candidate for ablation.  Due to that, we did discuss medication management.  Unfortunately he would only be a candidate for dofetilide at a lower dose or amiodarone.  Both him and his wife feel that they do not want  him hospitalized for dofetilide.  We Tieisha Darden start him on amiodarone 400 mg twice daily for 2 weeks followed by 400 mg daily for 2 weeks followed by 200 mg daily.  Get an ECG in 2 weeks to ensure that he is  in normal rhythm.  If he is in atrial fibrillation or flutter, we Makiah Foye likely plan for cardioversion at that time.  2.  Coronary artery disease: Status post multiple stents and CABG x2.  No current chest pain.  3.  Hypertension: Currently well controlled  4.  Hyperlipidemia: Continue statin per primary cardiology  Case discussed with primary cardiology  Current medicines are reviewed at length with the patient today.   The patient does not have concerns regarding his medicines.  The following changes were made today: Start amiodarone  Labs/ tests ordered today include:  No orders of the defined types were placed in this encounter.    Disposition:   FU with Henryk Ursin 3 months  Signed, Zionna Homewood Meredith Leeds, MD  09/02/2021 9:59 AM     [facility] [address] [place] [address] [phone] (office) [phone] (fax)

## 2021-09-03 NOTE — Telephone Encounter (Signed)
Wilkins, Randall Doyne, MD to Marcelle Overlie D, RPH-CPP     7:48 AM Yes, will need ECG in 2 weeks.    Spoke with the pts wife Randall Wilkins, and endorsed to her recommendations per Dr. Curt Bears, as written above. Informed the pts wife that Dr. Curt Bears will further assess this by having the pt have an EKG in 2 weeks, to assess QT interval.  Informed the pts wife 2 weeks would be on 10/5, and the pt already has an appt with our afib clinic to see Randall Wilkins on 10/5 at 0930.  Informed the pts wife that a note was placed in that 10/5 appt note with afib clinic, to do an EKG on the pt at that time, to assess QT interval, per Dr. Curt Bears. Wife verbalized understanding and agrees with this plan.  She will share this with the pt.  Wife was more than gracious for all the assistance provided.

## 2021-09-08 ENCOUNTER — Inpatient Hospital Stay: Payer: Medicare Other

## 2021-09-08 ENCOUNTER — Other Ambulatory Visit: Payer: Self-pay

## 2021-09-08 DIAGNOSIS — C3412 Malignant neoplasm of upper lobe, left bronchus or lung: Secondary | ICD-10-CM | POA: Diagnosis not present

## 2021-09-08 DIAGNOSIS — C3491 Malignant neoplasm of unspecified part of right bronchus or lung: Secondary | ICD-10-CM | POA: Diagnosis not present

## 2021-09-08 DIAGNOSIS — C3490 Malignant neoplasm of unspecified part of unspecified bronchus or lung: Secondary | ICD-10-CM

## 2021-09-08 LAB — CBC WITH DIFFERENTIAL (CANCER CENTER ONLY)
Abs Immature Granulocytes: 0.02 10*3/uL (ref 0.00–0.07)
Basophils Absolute: 0 10*3/uL (ref 0.0–0.1)
Basophils Relative: 0 %
Eosinophils Absolute: 0 10*3/uL (ref 0.0–0.5)
Eosinophils Relative: 1 %
HCT: 30.4 % — ABNORMAL LOW (ref 39.0–52.0)
Hemoglobin: 9.5 g/dL — ABNORMAL LOW (ref 13.0–17.0)
Immature Granulocytes: 0 %
Lymphocytes Relative: 13 %
Lymphs Abs: 0.7 10*3/uL (ref 0.7–4.0)
MCH: 28.6 pg (ref 26.0–34.0)
MCHC: 31.3 g/dL (ref 30.0–36.0)
MCV: 91.6 fL (ref 80.0–100.0)
Monocytes Absolute: 0.6 10*3/uL (ref 0.1–1.0)
Monocytes Relative: 11 %
Neutro Abs: 3.8 10*3/uL (ref 1.7–7.7)
Neutrophils Relative %: 75 %
Platelet Count: 128 10*3/uL — ABNORMAL LOW (ref 150–400)
RBC: 3.32 MIL/uL — ABNORMAL LOW (ref 4.22–5.81)
RDW: 16.9 % — ABNORMAL HIGH (ref 11.5–15.5)
WBC Count: 5.1 10*3/uL (ref 4.0–10.5)
nRBC: 0 % (ref 0.0–0.2)

## 2021-09-08 LAB — CMP (CANCER CENTER ONLY)
ALT: <6 U/L (ref 0–44)
AST: 11 U/L — ABNORMAL LOW (ref 15–41)
Albumin: 2.9 g/dL — ABNORMAL LOW (ref 3.5–5.0)
Alkaline Phosphatase: 97 U/L (ref 38–126)
Anion gap: 11 (ref 5–15)
BUN: 26 mg/dL — ABNORMAL HIGH (ref 8–23)
CO2: 25 mmol/L (ref 22–32)
Calcium: 8.7 mg/dL — ABNORMAL LOW (ref 8.9–10.3)
Chloride: 103 mmol/L (ref 98–111)
Creatinine: 1.83 mg/dL — ABNORMAL HIGH (ref 0.61–1.24)
GFR, Estimated: 37 mL/min — ABNORMAL LOW (ref >60)
Glucose, Bld: 179 mg/dL — ABNORMAL HIGH (ref 70–99)
Potassium: 4.3 mmol/L (ref 3.5–5.1)
Sodium: 139 mmol/L (ref 135–145)
Total Bilirubin: 0.7 mg/dL (ref 0.3–1.2)
Total Protein: 6.1 g/dL — ABNORMAL LOW (ref 6.5–8.1)

## 2021-09-09 ENCOUNTER — Ambulatory Visit (HOSPITAL_COMMUNITY)
Admission: RE | Admit: 2021-09-09 | Discharge: 2021-09-09 | Disposition: A | Discharge status: Home / Self Care | Payer: Medicare Other | Source: Ambulatory Visit | Attending: Internal Medicine | Admitting: Internal Medicine

## 2021-09-09 ENCOUNTER — Ambulatory Visit (INDEPENDENT_AMBULATORY_CARE_PROVIDER_SITE_OTHER): Payer: Medicare Other | Admitting: Endocrinology

## 2021-09-09 ENCOUNTER — Encounter: Payer: Self-pay | Admitting: Endocrinology

## 2021-09-09 VITALS — BP 124/58 | HR 54 | Ht 67.0 in | Wt 223.2 lb

## 2021-09-09 DIAGNOSIS — E039 Hypothyroidism, unspecified: Secondary | ICD-10-CM

## 2021-09-09 DIAGNOSIS — C3490 Malignant neoplasm of unspecified part of unspecified bronchus or lung: Secondary | ICD-10-CM | POA: Insufficient documentation

## 2021-09-09 DIAGNOSIS — J479 Bronchiectasis, uncomplicated: Secondary | ICD-10-CM | POA: Diagnosis not present

## 2021-09-09 DIAGNOSIS — I700 Atherosclerosis of aorta: Secondary | ICD-10-CM | POA: Diagnosis not present

## 2021-09-09 DIAGNOSIS — J90 Pleural effusion, not elsewhere classified: Secondary | ICD-10-CM | POA: Diagnosis not present

## 2021-09-09 DIAGNOSIS — Z794 Long term (current) use of insulin: Secondary | ICD-10-CM

## 2021-09-09 DIAGNOSIS — E1165 Type 2 diabetes mellitus with hyperglycemia: Secondary | ICD-10-CM | POA: Diagnosis not present

## 2021-09-09 DIAGNOSIS — R911 Solitary pulmonary nodule: Secondary | ICD-10-CM | POA: Diagnosis not present

## 2021-09-09 DIAGNOSIS — K661 Hemoperitoneum: Secondary | ICD-10-CM | POA: Diagnosis not present

## 2021-09-09 DIAGNOSIS — N2889 Other specified disorders of kidney and ureter: Secondary | ICD-10-CM | POA: Diagnosis not present

## 2021-09-09 LAB — POCT GLYCOSYLATED HEMOGLOBIN (HGB A1C): Hemoglobin A1C: 6.6 % — AB (ref 4.0–5.6)

## 2021-09-09 NOTE — Progress Notes (Signed)
Patient ID: Randall Wilkins, male   DOB: 08/12/1941, 80 y.o.   MRN: [id number]           Reason for Appointment: Follow-up for Type 2 Diabetes   History of Present Illness:          Date of diagnosis of type 2 diabetes mellitus:   1998       Background history:   Has been on insulin since about 2001 He thinks he was given metformin only for a month and may have stopped because of renal dysfunction Also Actos was tried and not clear what side effects he had Does not remember other medication he may have tried but has not tried Victoza or Byetta   Recent history:    INSULIN regimen is:   [place] 0-6units at 8 PM .  Humalog 5-10 units before meals  Non-insulin hypoglycemic drugs the patient is taking are: None  His A1c is 6 compared to 6.0   Current management, blood sugar patterns and problems identified:  His insulin was stopped on the last visit along with metformin because of low normal blood sugars and weight loss  However blood sugars have been gradually increased  Also appeared to be having mostly high readings in the last 2 weeks with blood sugars as high as 301  This is without any steroids  His appetite is variable but appears to be eating better and not losing weight  His wife is mostly giving him Humalog at mealtimes based on the blood sugar level with doses generally higher at lunchtime  Unable to assess his blood sugars after dinner because of not monitoring at that time  [place]: His wife is giving him some insulin only at night after he eats if he is having a full meal otherwise skipping the dose  However even without any [place] last evening his fasting glucose today was only 159  Hypoglycemia has been rare and only at suppertime Usually checking 3 times a day Not clear if he was able to get coverage for the Dexcom, previously freestyle libre was not accurate   Glucose monitoring:  Checking blood sugars 3-4 times a day       Glucometer: /contour   Data for  the last 4 weeks from meter download   PRE-MEAL Fasting Lunch Dinner Bedtime Overall  Glucose range: 115-301 85-238 60-298    Mean/median: 167 167 130  158   POST-MEAL PC Breakfast PC Lunch PC Dinner  Glucose range:   ?  298  Mean/median:      Previously:  PRE-MEAL Fasting Lunch Dinner Bedtime Overall  Glucose range: 47-90 60-131 93-181 ?   Mean/median:     ?   POST-MEAL PC Breakfast PC Lunch PC Dinner  Glucose range:   ?  Mean/median:         Self-care:  Meal times are:  Breakfast is at 8 a.m., lunch: 12 in the last 3 to 4 days noon Dinner: 5-6 PM   Typical meal intake: Breakfast is sometimes doughnuts or sometimes egg sandwich   lunch is a sandwich or cottage cheese/food, dinner is meat and vegetables/solid    Snacks: Sugar-free Jell-O or pudding, crackers          Dietician visit, most recent: 12/2017               CDE 8/18  Exercise:  unable to do any, using walker for ambulation  Weight history: Range 252-280 previously  Wt Readings from Last 3 Encounters:  09/09/21 223 lb 3.2 oz (101.2 kg)  09/02/21 222 lb 6.4 oz (100.9 kg)  08/29/21 221 lb 9.6 oz (100.5 kg)    Glycemic control:   Lab Results  Component Value Date   HGBA1C 6.6 (A) 09/09/2021   HGBA1C 6.0 (H) 05/02/2021   HGBA1C 6.7 (H) 02/26/2021   Lab Results  Component Value Date   MICROALBUR 35.5 (H) 02/12/2020   LDLCALC 36 01/06/2021   CREATININE 1.83 (H) 09/08/2021   Lab Results  Component Value Date   MICRALBCREAT 52.7 (H) 02/12/2020    Lab Results  Component Value Date   FRUCTOSAMINE 252 12/17/2017   FRUCTOSAMINE 233 08/09/2017      Allergies as of 09/09/2021       Reactions   Actos [pioglitazone] Swelling   Spironolactone Itching   Metformin And Related Nausea Only   3.16.2022 pt is currently taking this medication at home   Niaspan [niacin Er] Itching, Rash   Wound Dressing Adhesive Rash        Medication List        Accurate as of September 09, 2021  3:06 PM. If  you have any questions, ask your nurse or doctor.          allopurinol 100 MG tablet Commonly known as: ZYLOPRIM TAKE ONE TABLET EVERY DAY   amiodarone 200 MG tablet Commonly known as: PACERONE Take 1 tablet (200 mg total) by mouth daily.   amiodarone 200 MG tablet Commonly known as: PACERONE Take 1 tablet (200 mg total) by mouth daily.   amitriptyline 50 MG tablet Commonly known as: ELAVIL Take 50 mg by mouth at bedtime.   amLODipine 10 MG tablet Commonly known as: NORVASC TAKE ONE TABLET BY MOUTH ONCE DAILY   apixaban 5 MG Tabs tablet Commonly known as: Eliquis Take 1 tablet (5 mg total) by mouth 2 (two) times daily.   colchicine 0.6 MG tablet Take 0.6 mg by mouth 3 (three) times daily as needed.   Contour Next Monitor w/Device Kit 1 Device by Does not apply route 3 (three) times daily. Use to check blood sugars 3 times daily. Dx Code E13.9   Contour Next Test test strip Generic drug: glucose blood USE 1 STRIP TO CHECK GLUCOSE 4 TIMES DAILY   Easy Comfort Pen Needles 33G X 4 MM Misc Generic drug: Insulin Pen Needle 1 each by Does not apply route See admin instructions. Use to inject insulin 5 times daily.   furosemide 40 MG tablet Commonly known as: LASIX Take 2 tablets (80 mg total) by mouth 2 (two) times daily.   HumaLOG 100 UNIT/ML cartridge Generic drug: insulin lispro Inject 5-15 Units into the skin 3 (three) times daily with meals. Sliding scale.   hydrALAZINE 25 MG tablet Commonly known as: APRESOLINE Take 1.5 tablets (37.5 mg total) by mouth in the morning and at bedtime.   isosorbide dinitrate 20 MG tablet Commonly known as: ISORDIL Take 1 tablet (20 mg total) by mouth in the morning and at bedtime.   levothyroxine 75 MCG tablet Commonly known as: SYNTHROID Take 75 mcg by mouth daily before breakfast.   lidocaine-prilocaine cream Commonly known as: EMLA APPLY A TEASPOON OVER PORT SITE AT LEASTONE HOUR BEFORE LAB APPOINTMENT. DO NOT RUB IN  AND COVER WITH PLASTIC WRAP What changed: See the new instructions.   lisinopril 10 MG tablet Commonly known as: ZESTRIL Take 1 tablet (10 mg total) by mouth daily.   Lumakras 120 MG Tabs Generic drug: sotorasib TAKE 960 MG  BY MOUTH EVERY MORNING. What changed:  how much to take how to take this when to take this   ondansetron 4 MG tablet Commonly known as: ZOFRAN Take 1 tablet (4 mg total) by mouth every 6 (six) hours as needed for nausea.   OXYGEN Inhale 2 L into the lungs. At bedtime and during the day prn   potassium chloride SA 20 MEQ tablet Commonly known as: KLOR-CON Take 20 mEq by mouth 2 (two) times daily.   prochlorperazine 10 MG tablet Commonly known as: COMPAZINE Take 1 tablet (10 mg total) by mouth every 6 (six) hours as needed for nausea or vomiting.   rosuvastatin 20 MG tablet Commonly known as: CRESTOR Take 20 mg by mouth daily.   tamsulosin 0.4 MG Caps capsule Commonly known as: FLOMAX Take 0.4 mg by mouth at bedtime.   Tyler Aas FlexTouch 200 UNIT/ML FlexTouch Pen Generic drug: insulin degludec Inject 12 Units into the skin at bedtime. What changed: how much to take   vitamin B-12 1000 MCG tablet Commonly known as: CYANOCOBALAMIN Take 1,000 mcg by mouth at bedtime.        Allergies:  Allergies  Allergen Reactions   Actos [Pioglitazone] Swelling   Spironolactone Itching   Metformin And Related Nausea Only    3.16.2022 pt is currently taking this medication at home   Niaspan [Niacin Er] Itching and Rash   Wound Dressing Adhesive Rash    Past Medical History:  Diagnosis Date   Aortic stenosis, mild 11/14/2013   Aortic valve sclerosis 03/29/2015   Bilateral leg edema 05/21/2014   CAD (coronary artery disease)    Chronic diastolic congestive heart failure (HCC)    Chronic kidney disease (CKD), stage III (moderate) (HCC)    Diabetes 1.5, managed as type 1 ([place]) 02/04/2013   Diabetic peripheral neuropathy associated with type 1 diabetes  mellitus ([place]) 11/14/2013   Dysphagia    Dyspnea on exertion 03/21/2015   Exogenous obesity    Gout    Heart attack ([place])    History of nuclear stress test 07/2011   dipyridamole; fixed inferolateral defect, worse at stress than rest; no reversible ischemia; low risk scan    Hyperlipidemia    Hypertension    Hypothyroidism    Insulin dependent diabetes mellitus    Left foot drop    Left main coronary artery disease 03/28/2015   lung ca dx'd 08/2020   Macular pucker, right eye 01/29/2021   Vitrectomy membrane peel right eye 02-12-2021   Memory loss    Obesity (BMI 30-39.9) 11/14/2013   Obstructive sleep apnea 03/21/2015   OSA on CPAP    uses a cpap   Peptic ulcer with hemorrhage 03/28/2015   Peripheral neuropathy    Pneumonia    Rhabdomyolysis    S/P CABG x 2 03/29/2015   LIMA to Diagonal, SVG to OM, EVH via right thigh   Stroke ([place])    L patietal with small scattered lacunar infarcts   Thrombocytopenia ([place]) 03/21/2015   Venous insufficiency    Weakness generalized 03/21/2015    Past Surgical History:  Procedure Laterality Date   BACK SURGERY  2002   lumbosacral. 11 back surgeries total   BRONCHIAL BIOPSY  08/06/2020   Procedure: BRONCHIAL BIOPSIES;  Surgeon: Collene Gobble, MD;  Location: [place];  Service: Pulmonary;;   BRONCHIAL BRUSHINGS  08/06/2020   Procedure: BRONCHIAL BRUSHINGS;  Surgeon: Collene Gobble, MD;  Location: [facility] ENDOSCOPY;  Service: Pulmonary;;   BRONCHIAL NEEDLE ASPIRATION BIOPSY  08/06/2020   Procedure: BRONCHIAL NEEDLE ASPIRATION BIOPSIES;  Surgeon: Collene Gobble, MD;  Location: [facility] ENDOSCOPY;  Service: Pulmonary;;   CARDIOVERSION N/A 05/06/2021   Procedure: CARDIOVERSION;  Surgeon: Geralynn Rile, MD;  Location: [place];  Service: Cardiovascular;  Laterality: N/A;   Carotid Doppler  03/2013   bilat bulb/prox ICAs - mild amount of fibrous plaque with no evidence of diameter reduction   CARPAL TUNNEL RELEASE Bilateral 08/09/2014   Procedure:  BILATERAL CARPAL TUNNEL RELEASE;  Surgeon: Daryll Brod, MD;  Location: [place];  Service: Orthopedics;  Laterality: Bilateral;  ANESTHESIA:  IV REGIONAL BIL FAB   CHOLECYSTECTOMY     COLONOSCOPY     CORONARY ANGIOPLASTY  10/13/1996   CORONARY ANGIOPLASTY  09/21/1989   emergency PTCA   CORONARY ANGIOPLASTY  10/13/1996   Multi-Link diagonal & OD stenting (Dr. Marella Chimes)   [place]  12/03/1997   disease of mid DX-1 ~50% & in mid PLA & PDA (distal lesions) (Dr. Marella Chimes)    CORONARY ANGIOPLASTY  10/14/1999   progression of disease distal PLA & PDA; progression of disease prox RCA - moderate (Dr. Marella Chimes)    CORONARY ANGIOPLASTY WITH STENT PLACEMENT  04/04/2004   4.0x34m non-DES (thrombectomy via AngioJet) to RCA for high grade stenosis (Dr. RMarella Chimes   CORONARY ARTERY BYPASS GRAFT N/A 03/29/2015   Procedure: CORONARY ARTERY BYPASS GRAFTING TIMES TWO USING LEFT INTERNAL MAMMARY ARTERY AND RIGHT LEG GREATER SAPHENOUS VEIN HARVESTED ENDOSCOPICALLY.;  Surgeon: CRexene Alberts MD;  [place]  Service: Open Heart Surgery;  Laterality: N/A;   ESOPHAGOGASTRODUODENOSCOPY N/A 03/27/2015   Procedure: ESOPHAGOGASTRODUODENOSCOPY (EGD);  Surgeon: MClarene Essex MD;  [facility];  Service: Endoscopy;  Laterality: N/A;  possible dilation   ESOPHAGOGASTRODUODENOSCOPY N/A 10/05/2020   Procedure: ESOPHAGOGASTRODUODENOSCOPY (EGD);  Surgeon: OArta Silence MD;  [facility];  Service: Endoscopy;  Laterality: N/A;   EYE SURGERY Right    macular fold-02/2021   FINE NEEDLE ASPIRATION  08/06/2020   Procedure: FINE NEEDLE ASPIRATION (FNA) LINEAR;  Surgeon: BCollene Gobble MD;  [facility];  Service: Pulmonary;;   IR IMAGING GUIDED PORT INSERTION  10/07/2020   LEFT HEART CATHETERIZATION WITH CORONARY ANGIOGRAM N/A 03/28/2015   Procedure: LEFT HEART CATHETERIZATION WITH CORONARY ANGIOGRAM;  Surgeon: Peter M JMartinique MD;  [facility];  Service: Cardiovascular;  Laterality: N/A;   SINUS ENDO W/FUSION     TEE WITHOUT CARDIOVERSION N/A 05/06/2021   Procedure: TRANSESOPHAGEAL ECHOCARDIOGRAM (TEE);  Surgeon: OGeralynn Rile MD;  [place]  Service: Cardiovascular;  Laterality: N/A;   TONSILLECTOMY     TRANSTHORACIC ECHOCARDIOGRAM  08/08/2013   EF 55-60%, mild conc hypertrophy, grade 1 diastolic dysfunction; AV with mild stenosis; LA & RA mildly dilated   VIDEO BRONCHOSCOPY WITH ENDOBRONCHIAL NAVIGATION N/A 08/06/2020   Procedure: VIDEO BRONCHOSCOPY WITH ENDOBRONCHIAL NAVIGATION;  Surgeon: BCollene Gobble MD;  [facility];  Service: Pulmonary;  Laterality: N/A;   VIDEO BRONCHOSCOPY WITH ENDOBRONCHIAL ULTRASOUND N/A 08/06/2020   Procedure: VIDEO BRONCHOSCOPY WITH ENDOBRONCHIAL ULTRASOUND;  Surgeon: BCollene Gobble MD;  [facility];  Service: Pulmonary;  Laterality: N/A;    Family History  Problem Relation Age of Onset   Heart disease Mother    Coronary artery disease Father    Cancer Maternal Grandmother    Heart Problems Maternal Grandfather    Diabetes Son        borderline     Social History:  reports that he quit smoking about 47 years ago. His smoking use included cigarettes. He has a 40.00 pack-year smoking history. He has never used smokeless tobacco. He reports that he does not drink alcohol and does not use drugs.   Review of Systems   Lipid history: LDL controlled on 20 mg rosuvastatin, followed by PCP  Has history of CAD    Lab Results  Component Value Date   CHOL 94 01/06/2021   HDL 36.20 (L) 01/06/2021   LDLCALC 36 01/06/2021   TRIG 109.0 01/06/2021   CHOLHDL 3 01/06/2021           Hypertension: Mild and treated with 10 mg lisinopril has been followed by PCP  BP Readings from Last 3 Encounters:  09/09/21 (!) 124/58  09/02/21 134/70  08/29/21 (!) 131/57     CKD: His creatinine is somewhat inconsistent  His last microalbumin ratio was  53  Lab Results  Component Value Date   CREATININE 1.83 (H) 09/08/2021   CREATININE 1.63 (H) 08/20/2021   CREATININE 1.67 (H) 07/22/2021    Most recent eye exam was in 2/20, Dr. Gershon Crane and reportedly no retinopathy   Complications of diabetes: Peripheral neuropathy with sensory loss.  Dementia: Followed by neurologist and is on Aricept  He is on amiodarone now for atrial flutter Currently on 75 mcg levothyroxine  Lab Results  Component Value Date   TSH 3.727 05/02/2021      Physical Examination:  BP (!) 124/58   Pulse (!) 54   Ht '5\' 7"'  (1.702 m)   Wt 223 lb 3.2 oz (101.2 kg)   SpO2 (!) 83%   BMI 34.96 kg/m       ASSESSMENT:  Diabetes type 2, insulin requiring with obesity and history of neuropathy  See history of present illness for detailed discussion of current diabetes management, blood sugar patterns from his meter and freestyle [employer] and problems identified  His A1c is 6% about 2 months ago and previously was 6.7. Currently on low doses of basal bolus insulin and low-dose metformin   Recently his blood sugars are lower, lowest reading 47 in the morning and he is also losing weight  Considering his age and comorbid conditions with his lung cancer his blood sugar targets need to be relaxed Also with his renal insufficiency unable to prescribe much metformin and may be better to stop this since renal function may worsen  PLAN:    He will continue on basal bolus insulin regimen Because of his variable and sometimes decreased appetite not a good candidate for a GLP-1 drug Also with his variable renal function and use of diuretics likely will not respond adequately with SGLT2 drugs and recent GFR is only 37  He will take [place] at least 6 units consistently every evening at dinnertime  Today discussed that this is not to be adjusted based on his meal intake but on fasting blood sugars  He will keep a record on a flowsheet that was provided and  discussed algorithm for adjusting the dose He will go up to 2 units every 3 days if morning sugars stays consistently over 150  However if blood sugars are below 100 he can reduce by 2 units  Also discussed taking Humalog based on the planned meal size or right after eating based on his intake Periodically needs blood sugars after dinner and if these are consistently over 200 will increase the Humalog coverage 10 We will also try to send the [facility] prescription  again to check on coverage, freestyle Elenor Legato is not accurate for him  Will check TSH regularly especially since he is on amiodarone  Total visit time including counseling = 30 minutes  Patient Instructions  6 Tresiba at supper daily  Keep 2 hour sugar <200         Elayne Snare 09/09/2021, 3:06 PM   Note: This office note was prepared with Dragon voice recognition system technology. Any transcriptional errors that result from this process are unintentional.

## 2021-09-09 NOTE — Patient Instructions (Signed)
6 Tresiba at supper daily  Keep 2 hour sugar <200

## 2021-09-10 ENCOUNTER — Other Ambulatory Visit: Payer: Self-pay

## 2021-09-10 ENCOUNTER — Inpatient Hospital Stay (HOSPITAL_BASED_OUTPATIENT_CLINIC_OR_DEPARTMENT_OTHER): Payer: Medicare Other | Admitting: Internal Medicine

## 2021-09-10 ENCOUNTER — Other Ambulatory Visit: Payer: Self-pay | Admitting: Internal Medicine

## 2021-09-10 VITALS — BP 108/50 | HR 55 | Temp 97.7°F | Resp 18

## 2021-09-10 DIAGNOSIS — E1149 Type 2 diabetes mellitus with other diabetic neurological complication: Secondary | ICD-10-CM | POA: Diagnosis not present

## 2021-09-10 DIAGNOSIS — Z5111 Encounter for antineoplastic chemotherapy: Secondary | ICD-10-CM

## 2021-09-10 DIAGNOSIS — Z8673 Personal history of transient ischemic attack (TIA), and cerebral infarction without residual deficits: Secondary | ICD-10-CM | POA: Diagnosis not present

## 2021-09-10 DIAGNOSIS — E1129 Type 2 diabetes mellitus with other diabetic kidney complication: Secondary | ICD-10-CM | POA: Diagnosis not present

## 2021-09-10 DIAGNOSIS — C3412 Malignant neoplasm of upper lobe, left bronchus or lung: Secondary | ICD-10-CM | POA: Diagnosis not present

## 2021-09-10 DIAGNOSIS — C3491 Malignant neoplasm of unspecified part of right bronchus or lung: Secondary | ICD-10-CM

## 2021-09-10 DIAGNOSIS — G629 Polyneuropathy, unspecified: Secondary | ICD-10-CM | POA: Diagnosis not present

## 2021-09-10 DIAGNOSIS — I2510 Atherosclerotic heart disease of native coronary artery without angina pectoris: Secondary | ICD-10-CM | POA: Diagnosis not present

## 2021-09-10 DIAGNOSIS — E039 Hypothyroidism, unspecified: Secondary | ICD-10-CM | POA: Diagnosis not present

## 2021-09-10 DIAGNOSIS — Z79899 Other long term (current) drug therapy: Secondary | ICD-10-CM | POA: Diagnosis not present

## 2021-09-10 DIAGNOSIS — Z0000 Encounter for general adult medical examination without abnormal findings: Secondary | ICD-10-CM | POA: Diagnosis not present

## 2021-09-10 NOTE — Progress Notes (Signed)
[place] [phone]   [phone]  OFFICE PROGRESS NOTE  [name], MD [address] [place] [address]  DIAGNOSIS: Stage IV (T4, N2, M1 a) non-small cell lung cancer, mucinous adenocarcinoma presented with bilateral large masses in the left upper lobe as well as the right lower lobe in addition to other small nodules in the right upper lobe with hilar and mediastinal lymphadenopathy diagnosed in August 2021.  Biomarker Findings Microsatellite status - MS-Stable Tumor Mutational Burden - 4 Muts/Mb Genomic Findings For a complete list of the genes assayed, please refer to the Appendix. KRAS G12C GNAS R201H 7 Disease relevant genes with no reportable alterations: ALK, BRAF, EGFR, ERBB2, MET, RET, ROS1  PDL1 Expression: 20%.  PRIOR THERAPY: Systemic chemotherapy with carboplatin for AUC of 5, Alimta 500 mg/M2 and Keytruda 200 mg IV every 3 weeks.  First dose 09/09/2020.  Status post 6 cycles.  Starting from cycle #5 he has been on maintenance treatment with Alimta and Keytruda.  CURRENT THERAPY: Lumakras (Sotorasib) 960 mg p.o. daily.  First dose started 01/25/2021.  Status post 8 months of treatment.  INTERVAL HISTORY: Randall Wilkins 80 y.o. male returns to the clinic today for follow-up visit accompanied by his wife.  The patient continues to complain of increasing fatigue and weakness that has been getting worse last week since starting treatment with amiodarone for his atrial fibrillation.  He denied having any current chest pain but has shortness of breath at baseline and currently on home oxygen.  He has no cough or hemoptysis.  He denied having any fever or chills.  He has no nausea, vomiting, diarrhea or constipation.  He has no headache or visual changes.  He has been tolerating his treatment with Lumakras (Sotorasib) fairly well.  The patient had repeat CT scan of the chest, abdomen pelvis performed yesterday and he  is here for evaluation and discussion of his scan results.    MEDICAL HISTORY: Past Medical History:  Diagnosis Date   Aortic stenosis, mild 11/14/2013   Aortic valve sclerosis 03/29/2015   Bilateral leg edema 05/21/2014   CAD (coronary artery disease)    Chronic diastolic congestive heart failure (HCC)    Chronic kidney disease (CKD), stage III (moderate) (HCC)    Diabetes 1.5, managed as type 1 ([place]) 02/04/2013   Diabetic peripheral neuropathy associated with type 1 diabetes mellitus ([place]) 11/14/2013   Dysphagia    Dyspnea on exertion 03/21/2015   Exogenous obesity    Gout    Heart attack ([place])    History of nuclear stress test 07/2011   dipyridamole; fixed inferolateral defect, worse at stress than rest; no reversible ischemia; low risk scan    Hyperlipidemia    Hypertension    Hypothyroidism    Insulin dependent diabetes mellitus    Left foot drop    Left main coronary artery disease 03/28/2015   lung ca dx'd 08/2020   Macular pucker, right eye 01/29/2021   Vitrectomy membrane peel right eye 02-12-2021   Memory loss    Obesity (BMI 30-39.9) 11/14/2013   Obstructive sleep apnea 03/21/2015   OSA on CPAP    uses a cpap   Peptic ulcer with hemorrhage 03/28/2015   Peripheral neuropathy    Pneumonia    Rhabdomyolysis    S/P CABG x 2 03/29/2015   LIMA to Diagonal, SVG to OM, EVH via right thigh   Stroke ([place])    L patietal with  small scattered lacunar infarcts   Thrombocytopenia ([place]) 03/21/2015   Venous insufficiency    Weakness generalized 03/21/2015    ALLERGIES:  is allergic to actos [pioglitazone], spironolactone, metformin and related, niaspan [niacin er], and wound dressing adhesive.  MEDICATIONS:  Current Outpatient Medications  Medication Sig Dispense Refill   allopurinol (ZYLOPRIM) 100 MG tablet TAKE ONE TABLET EVERY DAY 30 tablet 6   amiodarone (PACERONE) 200 MG tablet Take 1 tablet (200 mg total) by mouth daily. 84 tablet 0   amiodarone (PACERONE) 200 MG tablet Take 1 tablet  (200 mg total) by mouth daily. 30 tablet 3   amitriptyline (ELAVIL) 50 MG tablet Take 50 mg by mouth at bedtime.     amLODipine (NORVASC) 10 MG tablet TAKE ONE TABLET BY MOUTH ONCE DAILY 90 tablet 3   apixaban (ELIQUIS) 5 MG TABS tablet Take 1 tablet (5 mg total) by mouth 2 (two) times daily. 60 tablet 11   Blood Glucose Monitoring Suppl (CONTOUR NEXT MONITOR) w/Device KIT 1 Device by Does not apply route 3 (three) times daily. Use to check blood sugars 3 times daily. Dx Code E13.9 1 kit 2   colchicine 0.6 MG tablet Take 0.6 mg by mouth 3 (three) times daily as needed.     CONTOUR NEXT TEST test strip USE 1 STRIP TO CHECK GLUCOSE 4 TIMES DAILY 300 each 0   furosemide (LASIX) 40 MG tablet Take 2 tablets (80 mg total) by mouth 2 (two) times daily. 180 tablet 1   hydrALAZINE (APRESOLINE) 25 MG tablet Take 1.5 tablets (37.5 mg total) by mouth in the morning and at bedtime. 270 tablet 3   insulin degludec (TRESIBA FLEXTOUCH) 200 UNIT/ML FlexTouch Pen Inject 12 Units into the skin at bedtime. (Patient taking differently: Inject 6 Units into the skin at bedtime.)     insulin lispro (HUMALOG) 100 UNIT/ML cartridge Inject 5-15 Units into the skin 3 (three) times daily with meals. Sliding scale.     Insulin Pen Needle (EASY COMFORT PEN NEEDLES) 33G X 4 MM MISC 1 each by Does not apply route See admin instructions. Use to inject insulin 5 times daily. 150 each 3   isosorbide dinitrate (ISORDIL) 20 MG tablet Take 1 tablet (20 mg total) by mouth in the morning and at bedtime. 180 tablet 3   levothyroxine (SYNTHROID, LEVOTHROID) 75 MCG tablet Take 75 mcg by mouth daily before breakfast.   1   lidocaine-prilocaine (EMLA) cream APPLY A TEASPOON OVER PORT SITE AT LEASTONE HOUR BEFORE LAB APPOINTMENT. DO NOT RUB IN AND COVER WITH PLASTIC WRAP (Patient taking differently: Apply 1 application topically See admin instructions. Apply a teaspoon over port site at least one hour before lab appointment. Do not rub in and cover  with plastic wrap.) 30 g 0   lisinopril (ZESTRIL) 10 MG tablet Take 1 tablet (10 mg total) by mouth daily. 90 tablet 3   ondansetron (ZOFRAN) 4 MG tablet Take 1 tablet (4 mg total) by mouth every 6 (six) hours as needed for nausea. 20 tablet 0   OXYGEN Inhale 2 L into the lungs. At bedtime and during the day prn     potassium chloride SA (K-DUR,KLOR-CON) 20 MEQ tablet Take 20 mEq by mouth 2 (two) times daily.      prochlorperazine (COMPAZINE) 10 MG tablet Take 1 tablet (10 mg total) by mouth every 6 (six) hours as needed for nausea or vomiting. 30 tablet 0   rosuvastatin (CRESTOR) 20 MG tablet Take 20 mg by mouth  daily.     sotorasib 120 MG TABS TAKE 960 MG BY MOUTH EVERY MORNING. (Patient taking differently: Take 960 mg by mouth daily.) 240 tablet 3   Tamsulosin HCl (FLOMAX) 0.4 MG CAPS Take 0.4 mg by mouth at bedtime.     vitamin B-12 (CYANOCOBALAMIN) 1000 MCG tablet Take 1,000 mcg by mouth at bedtime.     No current facility-administered medications for this visit.    SURGICAL HISTORY:  Past Surgical History:  Procedure Laterality Date   BACK SURGERY  2002   lumbosacral. 11 back surgeries total   BRONCHIAL BIOPSY  08/06/2020   Procedure: BRONCHIAL BIOPSIES;  Surgeon: Collene Gobble, MD;  Location[facility] ENDOSCOPY;  Service: Pulmonary;;   BRONCHIAL BRUSHINGS  08/06/2020   Procedure: BRONCHIAL BRUSHINGS;  Surgeon: Collene Gobble, MD;  Location: [facility] ENDOSCOPY;  Service: Pulmonary;;   BRONCHIAL NEEDLE ASPIRATION BIOPSY  08/06/2020   Procedure: BRONCHIAL NEEDLE ASPIRATION BIOPSIES;  Surgeon: Collene Gobble, MD;  Location: [facility] ENDOSCOPY;  Service: Pulmonary;;   CARDIOVERSION N/A 05/06/2021   Procedure: CARDIOVERSION;  Surgeon: Geralynn Rile, MD;  Location: [place];  Service: Cardiovascular;  Laterality: N/A;   Carotid Doppler  03/2013   bilat bulb/prox ICAs - mild amount of fibrous plaque with no evidence of diameter reduction   CARPAL TUNNEL RELEASE Bilateral 08/09/2014    Procedure: BILATERAL CARPAL TUNNEL RELEASE;  Surgeon: Daryll Brod, MD;  Location: [place];  Service: Orthopedics;  Laterality: Bilateral;  ANESTHESIA:  IV REGIONAL BIL FAB   CHOLECYSTECTOMY     COLONOSCOPY     CORONARY ANGIOPLASTY  10/13/1996   CORONARY ANGIOPLASTY  09/21/1989   emergency PTCA   CORONARY ANGIOPLASTY  10/13/1996   Multi-Link diagonal & OD stenting (Dr. Marella Chimes)   [place]  12/03/1997   disease of mid DX-1 ~50% & in mid PLA & PDA (distal lesions) (Dr. Marella Chimes)    CORONARY ANGIOPLASTY  10/14/1999   progression of disease distal PLA & PDA; progression of disease prox RCA - moderate (Dr. Marella Chimes)    CORONARY ANGIOPLASTY WITH STENT PLACEMENT  04/04/2004   4.0x46m non-DES (thrombectomy via AngioJet) to RCA for high grade stenosis (Dr. RMarella Chimes   CORONARY ARTERY BYPASS GRAFT N/A 03/29/2015   Procedure: CORONARY ARTERY BYPASS GRAFTING TIMES TWO USING LEFT INTERNAL MAMMARY ARTERY AND RIGHT LEG GREATER SAPHENOUS VEIN HARVESTED ENDOSCOPICALLY.;  Surgeon: CRexene Alberts MD;  [place]  Service: Open Heart Surgery;  Laterality: N/A;   ESOPHAGOGASTRODUODENOSCOPY N/A 03/27/2015   Procedure: ESOPHAGOGASTRODUODENOSCOPY (EGD);  Surgeon: MClarene Essex MD;  [facility];  Service: Endoscopy;  Laterality: N/A;  possible dilation   ESOPHAGOGASTRODUODENOSCOPY N/A 10/05/2020   Procedure: ESOPHAGOGASTRODUODENOSCOPY (EGD);  Surgeon: OArta Silence MD;  [facility];  Service: Endoscopy;  Laterality: N/A;   EYE SURGERY Right    macular fold-02/2021   FINE NEEDLE ASPIRATION  08/06/2020   Procedure: FINE NEEDLE ASPIRATION (FNA) LINEAR;  Surgeon: BCollene Gobble MD;  [facility];  Service: Pulmonary;;   IR IMAGING GUIDED PORT INSERTION  10/07/2020   LEFT HEART CATHETERIZATION WITH CORONARY ANGIOGRAM N/A 03/28/2015   Procedure: LEFT HEART CATHETERIZATION WITH CORONARY ANGIOGRAM;  Surgeon: Peter M JMartinique MD;   [facility];  Service: Cardiovascular;  Laterality: N/A;   SINUS ENDO W/FUSION     TEE WITHOUT CARDIOVERSION N/A 05/06/2021   Procedure: TRANSESOPHAGEAL ECHOCARDIOGRAM (TEE);  Surgeon: OGeralynn Rile MD;  [place] [place]  Service: Cardiovascular;  Laterality: N/A;  TONSILLECTOMY     TRANSTHORACIC ECHOCARDIOGRAM  08/08/2013   EF 55-60%, mild conc hypertrophy, grade 1 diastolic dysfunction; AV with mild stenosis; LA & RA mildly dilated   VIDEO BRONCHOSCOPY WITH ENDOBRONCHIAL NAVIGATION N/A 08/06/2020   Procedure: VIDEO BRONCHOSCOPY WITH ENDOBRONCHIAL NAVIGATION;  Surgeon: Collene Gobble, MD;  Location: [place] ENDOSCOPY;  Service: Pulmonary;  Laterality: N/A;   VIDEO BRONCHOSCOPY WITH ENDOBRONCHIAL ULTRASOUND N/A 08/06/2020   Procedure: VIDEO BRONCHOSCOPY WITH ENDOBRONCHIAL ULTRASOUND;  Surgeon: Collene Gobble, MD;  Location: [place] ENDOSCOPY;  Service: Pulmonary;  Laterality: N/A;    REVIEW OF SYSTEMS:  Constitutional: positive for fatigue Eyes: negative Ears, nose, mouth, throat, and face: negative Respiratory: positive for dyspnea on exertion Cardiovascular: negative Gastrointestinal: negative Genitourinary:negative Integument/breast: negative Hematologic/lymphatic: negative Musculoskeletal:negative Neurological: negative Behavioral/Psych: negative Endocrine: negative Allergic/Immunologic: negative   PHYSICAL EXAMINATION: General appearance: alert, cooperative, fatigued, and no distress Head: Normocephalic, without obvious abnormality, atraumatic Neck: no adenopathy, no JVD, supple, symmetrical, trachea midline, and thyroid not enlarged, symmetric, no tenderness/mass/nodules Lymph nodes: Cervical, supraclavicular, and axillary nodes normal. Resp: clear to auscultation bilaterally Back: symmetric, no curvature. ROM normal. No CVA tenderness. Cardio: irregularly irregular rhythm GI: soft, non-tender; bowel sounds normal; no masses,  no organomegaly Extremities:  edema 1 + edema bilaterally. Neurologic: Alert and oriented X 3, normal strength and tone. Normal symmetric reflexes. Normal coordination and gait  ECOG PERFORMANCE STATUS: 1 - Symptomatic but completely ambulatory  Blood pressure (!) 108/50, pulse (!) 55, temperature 97.7 F (36.5 C), temperature source Oral, resp. rate 18, SpO2 96 %.  LABORATORY DATA: Lab Results  Component Value Date   WBC 5.1 09/08/2021   HGB 9.5 (L) 09/08/2021   HCT 30.4 (L) 09/08/2021   MCV 91.6 09/08/2021   PLT 128 (L) 09/08/2021      Chemistry      Component Value Date/Time   NA 139 09/08/2021 0935   NA 141 06/11/2021 0937   K 4.3 09/08/2021 0935   CL 103 09/08/2021 0935   CO2 25 09/08/2021 0935   BUN 26 (H) 09/08/2021 0935   BUN 43 (H) 06/11/2021 0937   CREATININE 1.83 (H) 09/08/2021 0935   CREATININE 1.30 (H) 04/30/2020 1044      Component Value Date/Time   CALCIUM 8.7 (L) 09/08/2021 0935   ALKPHOS 97 09/08/2021 0935   AST 11 (L) 09/08/2021 0935   ALT <6 09/08/2021 0935   BILITOT 0.7 09/08/2021 0935       RADIOGRAPHIC STUDIES: No results found.   ASSESSMENT AND PLAN: This is a very pleasant 80 years old white male diagnosed with a stage IV (T4, N2, M1 a) non-small cell lung cancer, mucinous adenocarcinoma presented with bilateral large masses in the left upper lobe as well as the right upper lobe in addition to other pulmonary nodules in the right upper lobe with hilar and mediastinal lymphadenopathy diagnosed in August 2021. Molecular study showed positive KRAS G12C mutation and PD-L1 expression of 20%. The patient is currently undergoing systemic chemotherapy with carboplatin for AUC of 5, Alimta 500 mg/M2 and Keytruda 200 mg IV every 3 weeks status post 6 cycles. Starting from cycle #5 the patient is on maintenance treatment with Alimta and Keytruda every 3 weeks.  His treatment was discontinued secondary to disease progression.  The patient is currently on oral treatment with Lumakras  (Sotorasib) 960 mg p.o. daily started on 01/25/2021.  Status post 8 months of treatment. The patient has been tolerating this treatment well with no concerning adverse effects. He has  more fatigue last week since starting treatment with amiodarone for the atrial fibrillation. The patient had repeat CT scan of the chest, abdomen pelvis performed yesterday.  I personally and independently reviewed the scan images and discussed it with the patient and his wife.  The final report of the scan is still pending but I did not see any significant change from the previous imaging studies.  I would wait for the final report for confirmation. I recommended for the patient to continue his current treatment with Lumakras (Sotorasib) for now unless his scan showed any concerning findings for progression, I will call him with other recommendation. The patient will come back for follow-up visit in 1 months for evaluation and repeat blood work. He was advised to call immediately if he has any other concerning symptoms in the interval. The patient voices understanding of current disease status and treatment options and is in agreement with the current care plan.  All questions were answered. The patient knows to call the clinic with any problems, questions or concerns. We can certainly see the patient much sooner if necessary.  Disclaimer: This note was dictated with voice recognition software. Similar sounding words can inadvertently be transcribed and may not be corrected upon review.

## 2021-09-17 ENCOUNTER — Other Ambulatory Visit: Payer: Self-pay

## 2021-09-17 ENCOUNTER — Ambulatory Visit (HOSPITAL_COMMUNITY)
Admission: RE | Admit: 2021-09-17 | Discharge: 2021-09-17 | Disposition: A | Discharge status: Home / Self Care | Payer: Medicare Other | Source: Ambulatory Visit | Attending: Nurse Practitioner | Admitting: Nurse Practitioner

## 2021-09-17 ENCOUNTER — Encounter (HOSPITAL_COMMUNITY): Payer: Self-pay | Admitting: Nurse Practitioner

## 2021-09-17 VITALS — BP 120/42 | HR 52 | Ht 67.0 in | Wt 222.0 lb

## 2021-09-17 DIAGNOSIS — I480 Paroxysmal atrial fibrillation: Secondary | ICD-10-CM

## 2021-09-17 DIAGNOSIS — D6869 Other thrombophilia: Secondary | ICD-10-CM | POA: Diagnosis not present

## 2021-09-17 DIAGNOSIS — I10 Essential (primary) hypertension: Secondary | ICD-10-CM | POA: Diagnosis not present

## 2021-09-17 DIAGNOSIS — Z87891 Personal history of nicotine dependence: Secondary | ICD-10-CM | POA: Insufficient documentation

## 2021-09-17 DIAGNOSIS — I4892 Unspecified atrial flutter: Secondary | ICD-10-CM | POA: Insufficient documentation

## 2021-09-17 DIAGNOSIS — I4819 Other persistent atrial fibrillation: Secondary | ICD-10-CM | POA: Diagnosis not present

## 2021-09-17 DIAGNOSIS — Z7901 Long term (current) use of anticoagulants: Secondary | ICD-10-CM | POA: Insufficient documentation

## 2021-09-17 DIAGNOSIS — Z8249 Family history of ischemic heart disease and other diseases of the circulatory system: Secondary | ICD-10-CM | POA: Insufficient documentation

## 2021-09-17 DIAGNOSIS — Z5181 Encounter for therapeutic drug level monitoring: Secondary | ICD-10-CM | POA: Diagnosis not present

## 2021-09-17 DIAGNOSIS — Z79899 Other long term (current) drug therapy: Secondary | ICD-10-CM | POA: Insufficient documentation

## 2021-09-17 NOTE — Progress Notes (Signed)
Primary Care Physician: [name], MD Referring Physician: Dr. Curt Bears Cardiologist: Dr. Hilty   Randall Wilkins is a 80 y.o. male with complex medical history obesity, multiple comorbidities including insulin-dependent diabetes, diabetic peripheral neuropathy, diabetic nephropathy, venous insufficiency and peripheral neuropathy. He also has obstructive sleep apnea on CPAP and coronary artery disease. He unfortunately suffered a left parietal stroke with small lacunar infarcts noted on MRI in 2013. He is a left foot drop from an L4/L5 neuropathy and wears a brace for this. From a cardiac standpoint he had a large non-drug-eluting stent placed in the RCA in 2005. He has had a stent placed at 89 to a diagonal which was noted to be patent. He's had some problems with dizziness which is improved. Ambulation is difficult for him  with painful neuropathy.  He denies any cardiac chest pain or worsening shortness of breath. He has had issues with persistent atrial flutter with a cardioversion last spring only lasting a few days.   Dr. Debara Pickett recently sent to Dr. Curt Bears for options to treat atrial flutter. Dr. Curt Bears  opted to treat with amiodarone. He just ended the 400 mg bid dose and will start the 200 mg bid tomorrow. There was a question re qt and amitriptyline, that can be qt prolonging. EKG reviewed with Dr. Curt Bears as it is very difficult to assess qt in atrial flutter.Plan is  to cardiovert in 2 weeks. He is on eliquis 5 mg bid for a CHA2DS2VASc  score of at least 7.    Today, he denies symptoms of palpitations, chest pain, shortness of breath, orthopnea, PND, lower extremity edema, dizziness, presyncope, syncope, or neurologic sequela. The patient is tolerating medications without difficulties and is otherwise without complaint today.   Past Medical History:  Diagnosis Date   Aortic stenosis, mild 11/14/2013   Aortic valve sclerosis 03/29/2015   Bilateral leg edema 05/21/2014   CAD (coronary  artery disease)    Chronic diastolic congestive heart failure (HCC)    Chronic kidney disease (CKD), stage III (moderate) (HCC)    Diabetes 1.5, managed as type 1 ([place]) 02/04/2013   Diabetic peripheral neuropathy associated with type 1 diabetes mellitus ([place]) 11/14/2013   Dysphagia    Dyspnea on exertion 03/21/2015   Exogenous obesity    Gout    Heart attack ([place])    History of nuclear stress test 07/2011   dipyridamole; fixed inferolateral defect, worse at stress than rest; no reversible ischemia; low risk scan    Hyperlipidemia    Hypertension    Hypothyroidism    Insulin dependent diabetes mellitus    Left foot drop    Left main coronary artery disease 03/28/2015   lung ca dx'd 08/2020   Macular pucker, right eye 01/29/2021   Vitrectomy membrane peel right eye 02-12-2021   Memory loss    Obesity (BMI 30-39.9) 11/14/2013   Obstructive sleep apnea 03/21/2015   OSA on CPAP    uses a cpap   Peptic ulcer with hemorrhage 03/28/2015   Peripheral neuropathy    Pneumonia    Rhabdomyolysis    S/P CABG x 2 03/29/2015   LIMA to Diagonal, SVG to OM, EVH via right thigh   Stroke ([place])    L patietal with small scattered lacunar infarcts   Thrombocytopenia ([place]) 03/21/2015   Venous insufficiency    Weakness generalized 03/21/2015   Past Surgical History:  Procedure Laterality Date   BACK SURGERY  2002   lumbosacral. 11 back surgeries total  BRONCHIAL BIOPSY  08/06/2020   Procedure: BRONCHIAL BIOPSIES;  Surgeon: Collene Gobble, MD;  Location: [facility] ENDOSCOPY;  Service: Pulmonary;;   BRONCHIAL BRUSHINGS  08/06/2020   Procedure: BRONCHIAL BRUSHINGS;  Surgeon: Collene Gobble, MD;  Location: [facility] ENDOSCOPY;  Service: Pulmonary;;   BRONCHIAL NEEDLE ASPIRATION BIOPSY  08/06/2020   Procedure: BRONCHIAL NEEDLE ASPIRATION BIOPSIES;  Surgeon: Collene Gobble, MD;  Location: [facility] ENDOSCOPY;  Service: Pulmonary;;   CARDIOVERSION N/A 05/06/2021   Procedure: CARDIOVERSION;  Surgeon: Geralynn Rile, MD;   Location: [place];  Service: Cardiovascular;  Laterality: N/A;   Carotid Doppler  03/2013   bilat bulb/prox ICAs - mild amount of fibrous plaque with no evidence of diameter reduction   CARPAL TUNNEL RELEASE Bilateral 08/09/2014   Procedure: BILATERAL CARPAL TUNNEL RELEASE;  Surgeon: Daryll Brod, MD;  Location: [place];  Service: Orthopedics;  Laterality: Bilateral;  ANESTHESIA:  IV REGIONAL BIL FAB   CHOLECYSTECTOMY     COLONOSCOPY     CORONARY ANGIOPLASTY  10/13/1996   CORONARY ANGIOPLASTY  09/21/1989   emergency PTCA   CORONARY ANGIOPLASTY  10/13/1996   Multi-Link diagonal & OD stenting (Dr. Marella Chimes)   [place]  12/03/1997   disease of mid DX-1 ~50% & in mid PLA & PDA (distal lesions) (Dr. Marella Chimes)    CORONARY ANGIOPLASTY  10/14/1999   progression of disease distal PLA & PDA; progression of disease prox RCA - moderate (Dr. Marella Chimes)    CORONARY ANGIOPLASTY WITH STENT PLACEMENT  04/04/2004   4.0x53m non-DES (thrombectomy via AngioJet) to RCA for high grade stenosis (Dr. RMarella Chimes   CORONARY ARTERY BYPASS GRAFT N/A 03/29/2015   Procedure: CORONARY ARTERY BYPASS GRAFTING TIMES TWO USING LEFT INTERNAL MAMMARY ARTERY AND RIGHT LEG GREATER SAPHENOUS VEIN HARVESTED ENDOSCOPICALLY.;  Surgeon: CRexene Alberts MD;  [place]  Service: Open Heart Surgery;  Laterality: N/A;   ESOPHAGOGASTRODUODENOSCOPY N/A 03/27/2015   Procedure: ESOPHAGOGASTRODUODENOSCOPY (EGD);  Surgeon: MClarene Essex MD;  [facility];  Service: Endoscopy;  Laterality: N/A;  possible dilation   ESOPHAGOGASTRODUODENOSCOPY N/A 10/05/2020   Procedure: ESOPHAGOGASTRODUODENOSCOPY (EGD);  Surgeon: OArta Silence MD;  [facility];  Service: Endoscopy;  Laterality: N/A;   EYE SURGERY Right    macular fold-02/2021   FINE NEEDLE ASPIRATION  08/06/2020   Procedure: FINE NEEDLE ASPIRATION (FNA) LINEAR;  Surgeon: BCollene Gobble MD;  [facility];   Service: Pulmonary;;   IR IMAGING GUIDED PORT INSERTION  10/07/2020   LEFT HEART CATHETERIZATION WITH CORONARY ANGIOGRAM N/A 03/28/2015   Procedure: LEFT HEART CATHETERIZATION WITH CORONARY ANGIOGRAM;  Surgeon: Peter M JMartinique MD;  [facility];  Service: Cardiovascular;  Laterality: N/A;   SINUS ENDO W/FUSION     TEE WITHOUT CARDIOVERSION N/A 05/06/2021   Procedure: TRANSESOPHAGEAL ECHOCARDIOGRAM (TEE);  Surgeon: OGeralynn Rile MD;  [place] [place]  Service: Cardiovascular;  Laterality: N/A;   TONSILLECTOMY     TRANSTHORACIC ECHOCARDIOGRAM  08/08/2013   EF 55-60%, mild conc hypertrophy, grade 1 diastolic dysfunction; AV with mild stenosis; LA & RA mildly dilated   VIDEO BRONCHOSCOPY WITH ENDOBRONCHIAL NAVIGATION N/A 08/06/2020   Procedure: VIDEO BRONCHOSCOPY WITH ENDOBRONCHIAL NAVIGATION;  Surgeon: BCollene Gobble MD;  [facility];  Service: Pulmonary;  Laterality: N/A;   VIDEO BRONCHOSCOPY WITH ENDOBRONCHIAL ULTRASOUND N/A 08/06/2020   Procedure: VIDEO BRONCHOSCOPY WITH ENDOBRONCHIAL ULTRASOUND;  Surgeon: BCollene Gobble MD;  [facility];  Service: Pulmonary;  Laterality: N/A;  Current Outpatient Medications  Medication Sig Dispense Refill   allopurinol (ZYLOPRIM) 100 MG tablet TAKE ONE TABLET EVERY DAY 30 tablet 6   amiodarone (PACERONE) 200 MG tablet Take 1 tablet (200 mg total) by mouth daily. (Patient taking differently: Take 200 mg by mouth 2 (two) times daily.) 30 tablet 3   amitriptyline (ELAVIL) 50 MG tablet Take 50 mg by mouth at bedtime.     amLODipine (NORVASC) 10 MG tablet TAKE ONE TABLET BY MOUTH ONCE DAILY 90 tablet 3   apixaban (ELIQUIS) 5 MG TABS tablet Take 1 tablet (5 mg total) by mouth 2 (two) times daily. 60 tablet 11   Blood Glucose Monitoring Suppl (CONTOUR NEXT MONITOR) w/Device KIT 1 Device by Does not apply route 3 (three) times daily. Use to check blood sugars 3 times daily. Dx Code E13.9 1 kit 2   colchicine 0.6  MG tablet Take 0.6 mg by mouth 3 (three) times daily as needed.     CONTOUR NEXT TEST test strip USE 1 STRIP TO CHECK GLUCOSE 4 TIMES DAILY 300 each 0   furosemide (LASIX) 40 MG tablet Take 2 tablets (80 mg total) by mouth 2 (two) times daily. 180 tablet 1   hydrALAZINE (APRESOLINE) 25 MG tablet Take 1.5 tablets (37.5 mg total) by mouth in the morning and at bedtime. 270 tablet 3   insulin degludec (TRESIBA FLEXTOUCH) 200 UNIT/ML FlexTouch Pen Inject 12 Units into the skin at bedtime. (Patient taking differently: Inject 6 Units into the skin at bedtime.)     insulin lispro (HUMALOG) 100 UNIT/ML cartridge Inject 5-15 Units into the skin 3 (three) times daily with meals. Sliding scale.     Insulin Pen Needle (EASY COMFORT PEN NEEDLES) 33G X 4 MM MISC 1 each by Does not apply route See admin instructions. Use to inject insulin 5 times daily. 150 each 3   isosorbide dinitrate (ISORDIL) 20 MG tablet Take 1 tablet (20 mg total) by mouth in the morning and at bedtime. 180 tablet 3   levothyroxine (SYNTHROID) 88 MCG tablet Take 88 mcg by mouth every morning.     lidocaine-prilocaine (EMLA) cream APPLY A TEASPOON OVER PORT SITE AT LEASTONE HOUR BEFORE LAB APPOINTMENT. DO NOT RUB IN AND COVER WITH PLASTIC WRAP (Patient taking differently: Apply 1 application topically See admin instructions. Apply a teaspoon over port site at least one hour before lab appointment. Do not rub in and cover with plastic wrap.) 30 g 0   lisinopril (ZESTRIL) 10 MG tablet Take 1 tablet (10 mg total) by mouth daily. 90 tablet 3   LUMAKRAS 120 MG TABS Take 8 tablets (956m) by mouth daily at one time as directed by physician 240 tablet 2   ondansetron (ZOFRAN) 4 MG tablet Take 1 tablet (4 mg total) by mouth every 6 (six) hours as needed for nausea. 20 tablet 0   OXYGEN Inhale 2 L into the lungs. At bedtime and during the day prn     potassium chloride SA (K-DUR,KLOR-CON) 20 MEQ tablet Take 20 mEq by mouth 2 (two) times daily.       prochlorperazine (COMPAZINE) 10 MG tablet Take 1 tablet (10 mg total) by mouth every 6 (six) hours as needed for nausea or vomiting. 30 tablet 0   rosuvastatin (CRESTOR) 20 MG tablet Take 20 mg by mouth daily.     Tamsulosin HCl (FLOMAX) 0.4 MG CAPS Take 0.4 mg by mouth at bedtime.     vitamin B-12 (CYANOCOBALAMIN) 1000 MCG tablet Take 1,000  mcg by mouth at bedtime.     No current facility-administered medications for this encounter.    Allergies  Allergen Reactions   Actos [Pioglitazone] Swelling   Spironolactone Itching   Metformin And Related Nausea Only    3.16.2022 pt is currently taking this medication at home   Niaspan [Niacin Er] Itching and Rash   Wound Dressing Adhesive Rash    Social History   Socioeconomic History   Marital status: Married    Spouse name: Not on file   Number of children: 3   Years of education: 12   Highest education level: Not on file  Occupational History   Not on file  Tobacco Use   Smoking status: Former    Packs/day: 2.00    Years: 20.00    Pack years: 40.00    Types: Cigarettes    Quit date: 01/25/1974    Years since quitting: 47.6   Smokeless tobacco: Never  Vaping Use   Vaping Use: Never used  Substance and Sexual Activity   Alcohol use: No    Alcohol/week: 0.0 standard drinks   Drug use: No   Sexual activity: Not on file  Other Topics Concern   Not on file  Social History Narrative   Lives with wife and son   Right handed    Caffeine: maybe 1 cup/day   Social Determinants of Health   Financial Resource Strain: Not on file  Food Insecurity: Not on file  Transportation Needs: Not on file  Physical Activity: Not on file  Stress: Not on file  Social Connections: Not on file  Intimate Partner Violence: Not on file    Family History  Problem Relation Age of Onset   Heart disease Mother    Coronary artery disease Father    Cancer Maternal Grandmother    Heart Problems Maternal Grandfather    Diabetes Son         borderline     ROS- All systems are reviewed and negative except as per the HPI above  Physical Exam: Vitals:   09/17/21 0916  BP: (!) 120/42  Pulse: (!) 52  Weight: 100.7 kg  Height: '5\' 7"'  (1.702 m)   Wt Readings from Last 3 Encounters:  09/17/21 100.7 kg  09/09/21 101.2 kg  09/02/21 100.9 kg    Labs: Lab Results  Component Value Date   NA 139 09/08/2021   K 4.3 09/08/2021   CL 103 09/08/2021   CO2 25 09/08/2021   GLUCOSE 179 (H) 09/08/2021   BUN 26 (H) 09/08/2021   CREATININE 1.83 (H) 09/08/2021   CALCIUM 8.7 (L) 09/08/2021   PHOS 3.5 10/08/2020   MG 2.0 05/05/2021   Lab Results  Component Value Date   INR 1.1 05/05/2021   Lab Results  Component Value Date   CHOL 94 01/06/2021   HDL 36.20 (L) 01/06/2021   LDLCALC 36 01/06/2021   TRIG 109.0 01/06/2021     GEN- The patient is well appearing, alert and oriented x 3 today.   Head- normocephalic, atraumatic Eyes-  Sclera clear, conjunctiva pink Ears- hearing intact Oropharynx- clear Neck- supple, no JVP Lymph- no cervical lymphadenopathy Lungs- Clear to ausculation bilaterally, normal work of breathing Heart- irregular rate and rhythm, no murmurs, rubs or gallops, PMI not laterally displaced GI- soft, NT, ND, + BS Extremities- no clubbing, cyanosis, or edema MS- no significant deformity or atrophy Skin- no rash or lesion Psych- euthymic mood, full affect Neuro- strength and sensation are intact  EKG-probable atrial  flutter with slow v response, qrs int 102 ms, qtc 436 ms  Epic records reviewed   Assessment and Plan:  1. Persistent aflutter  Currently loading on amiodarone and is scheduled for cardioversion 10/19 Risk vrs benefit of CV reviewed  Lowers amiodarone to  200 mg bid x 2 weeks and then 200 mg daily  I will bring back day of CV for cbc/bmet and ekg   2. CHA2DS2VASc  score of 7 On eliquis 5 mg bid  Wife states no missed doses x at least 3 weeks  I don't want to change dose of eliquis  right before cardioversion but with age of 80 yo and creatinine over 1.5, dose should be reduced to 2.5 mg bid   3. HTN Stable   4. CHF Weight stable   I will see back 10/19  Butch Penny C. Taniah Reinecke, [facility] [address] [place], [place] [address] [phone]

## 2021-09-17 NOTE — Patient Instructions (Signed)
Cardioversion scheduled for Wednesday, October 19th  -Come to afib clinic for ekg/labs at [place] at the [employer] and go to admitting at 1030AM  - Do not eat or drink anything after midnight the night prior to your procedure.  - Take all your morning medication (except diabetic medications) with a sip of water prior to arrival.  - You will not be able to drive home after your procedure.  - Do NOT miss any doses of your blood thinner - if you should miss a dose please notify our office immediately.  - If you feel as if you go back into normal rhythm prior to scheduled cardioversion, please notify our office immediately. If your procedure is canceled in the cardioversion suite you will be charged a cancellation fee.  Patients will be asked to: to mask in public and hand hygiene (no longer quarantine) in the 3 days prior to surgery, to report if any COVID-19-like illness or household contacts to COVID-19 to determine need for testing

## 2021-09-22 ENCOUNTER — Encounter (HOSPITAL_COMMUNITY): Payer: Self-pay | Admitting: Cardiology

## 2021-09-22 DIAGNOSIS — R3911 Hesitancy of micturition: Secondary | ICD-10-CM | POA: Diagnosis not present

## 2021-09-26 ENCOUNTER — Emergency Department (HOSPITAL_COMMUNITY): Payer: Medicare Other

## 2021-09-26 ENCOUNTER — Inpatient Hospital Stay (HOSPITAL_COMMUNITY)
Admission: EM | Admit: 2021-09-26 | Discharge: 2021-10-07 | DRG: 291 | Disposition: A | Discharge status: Hospice | Payer: Medicare Other | Attending: Internal Medicine | Admitting: Internal Medicine

## 2021-09-26 DIAGNOSIS — I2510 Atherosclerotic heart disease of native coronary artery without angina pectoris: Secondary | ICD-10-CM

## 2021-09-26 DIAGNOSIS — N179 Acute kidney failure, unspecified: Secondary | ICD-10-CM | POA: Diagnosis present

## 2021-09-26 DIAGNOSIS — R0689 Other abnormalities of breathing: Secondary | ICD-10-CM | POA: Diagnosis not present

## 2021-09-26 DIAGNOSIS — Z79899 Other long term (current) drug therapy: Secondary | ICD-10-CM

## 2021-09-26 DIAGNOSIS — E1021 Type 1 diabetes mellitus with diabetic nephropathy: Secondary | ICD-10-CM | POA: Diagnosis present

## 2021-09-26 DIAGNOSIS — N419 Inflammatory disease of prostate, unspecified: Secondary | ICD-10-CM | POA: Diagnosis present

## 2021-09-26 DIAGNOSIS — G9349 Other encephalopathy: Secondary | ICD-10-CM | POA: Diagnosis not present

## 2021-09-26 DIAGNOSIS — I350 Nonrheumatic aortic (valve) stenosis: Secondary | ICD-10-CM | POA: Diagnosis present

## 2021-09-26 DIAGNOSIS — I959 Hypotension, unspecified: Secondary | ICD-10-CM | POA: Diagnosis not present

## 2021-09-26 DIAGNOSIS — Z888 Allergy status to other drugs, medicaments and biological substances status: Secondary | ICD-10-CM

## 2021-09-26 DIAGNOSIS — Z87440 Personal history of urinary (tract) infections: Secondary | ICD-10-CM

## 2021-09-26 DIAGNOSIS — J9811 Atelectasis: Secondary | ICD-10-CM | POA: Diagnosis not present

## 2021-09-26 DIAGNOSIS — I130 Hypertensive heart and chronic kidney disease with heart failure and stage 1 through stage 4 chronic kidney disease, or unspecified chronic kidney disease: Secondary | ICD-10-CM | POA: Diagnosis not present

## 2021-09-26 DIAGNOSIS — Z8249 Family history of ischemic heart disease and other diseases of the circulatory system: Secondary | ICD-10-CM

## 2021-09-26 DIAGNOSIS — J9601 Acute respiratory failure with hypoxia: Secondary | ICD-10-CM

## 2021-09-26 DIAGNOSIS — Z66 Do not resuscitate: Secondary | ICD-10-CM | POA: Diagnosis present

## 2021-09-26 DIAGNOSIS — H9190 Unspecified hearing loss, unspecified ear: Secondary | ICD-10-CM | POA: Diagnosis present

## 2021-09-26 DIAGNOSIS — D631 Anemia in chronic kidney disease: Secondary | ICD-10-CM | POA: Diagnosis present

## 2021-09-26 DIAGNOSIS — M109 Gout, unspecified: Secondary | ICD-10-CM | POA: Diagnosis present

## 2021-09-26 DIAGNOSIS — I252 Old myocardial infarction: Secondary | ICD-10-CM

## 2021-09-26 DIAGNOSIS — E669 Obesity, unspecified: Secondary | ICD-10-CM | POA: Diagnosis present

## 2021-09-26 DIAGNOSIS — D696 Thrombocytopenia, unspecified: Secondary | ICD-10-CM | POA: Diagnosis not present

## 2021-09-26 DIAGNOSIS — E039 Hypothyroidism, unspecified: Secondary | ICD-10-CM | POA: Diagnosis present

## 2021-09-26 DIAGNOSIS — Z87891 Personal history of nicotine dependence: Secondary | ICD-10-CM

## 2021-09-26 DIAGNOSIS — I4892 Unspecified atrial flutter: Secondary | ICD-10-CM

## 2021-09-26 DIAGNOSIS — R778 Other specified abnormalities of plasma proteins: Secondary | ICD-10-CM | POA: Diagnosis present

## 2021-09-26 DIAGNOSIS — E1065 Type 1 diabetes mellitus with hyperglycemia: Secondary | ICD-10-CM | POA: Diagnosis present

## 2021-09-26 DIAGNOSIS — I878 Other specified disorders of veins: Secondary | ICD-10-CM | POA: Diagnosis present

## 2021-09-26 DIAGNOSIS — I10 Essential (primary) hypertension: Secondary | ICD-10-CM | POA: Diagnosis present

## 2021-09-26 DIAGNOSIS — R7989 Other specified abnormal findings of blood chemistry: Secondary | ICD-10-CM

## 2021-09-26 DIAGNOSIS — Z9981 Dependence on supplemental oxygen: Secondary | ICD-10-CM

## 2021-09-26 DIAGNOSIS — I2721 Secondary pulmonary arterial hypertension: Secondary | ICD-10-CM | POA: Diagnosis present

## 2021-09-26 DIAGNOSIS — R069 Unspecified abnormalities of breathing: Secondary | ICD-10-CM | POA: Diagnosis not present

## 2021-09-26 DIAGNOSIS — Z79890 Hormone replacement therapy (postmenopausal): Secondary | ICD-10-CM

## 2021-09-26 DIAGNOSIS — R0602 Shortness of breath: Secondary | ICD-10-CM

## 2021-09-26 DIAGNOSIS — N401 Enlarged prostate with lower urinary tract symptoms: Secondary | ICD-10-CM | POA: Diagnosis not present

## 2021-09-26 DIAGNOSIS — Z951 Presence of aortocoronary bypass graft: Secondary | ICD-10-CM

## 2021-09-26 DIAGNOSIS — I483 Typical atrial flutter: Secondary | ICD-10-CM | POA: Diagnosis present

## 2021-09-26 DIAGNOSIS — Z20822 Contact with and (suspected) exposure to covid-19: Secondary | ICD-10-CM | POA: Diagnosis present

## 2021-09-26 DIAGNOSIS — I4891 Unspecified atrial fibrillation: Secondary | ICD-10-CM

## 2021-09-26 DIAGNOSIS — E1042 Type 1 diabetes mellitus with diabetic polyneuropathy: Secondary | ICD-10-CM | POA: Diagnosis present

## 2021-09-26 DIAGNOSIS — N183 Chronic kidney disease, stage 3 unspecified: Secondary | ICD-10-CM | POA: Diagnosis present

## 2021-09-26 DIAGNOSIS — N1832 Chronic kidney disease, stage 3b: Secondary | ICD-10-CM | POA: Diagnosis present

## 2021-09-26 DIAGNOSIS — E1022 Type 1 diabetes mellitus with diabetic chronic kidney disease: Secondary | ICD-10-CM | POA: Diagnosis present

## 2021-09-26 DIAGNOSIS — J9611 Chronic respiratory failure with hypoxia: Secondary | ICD-10-CM | POA: Diagnosis present

## 2021-09-26 DIAGNOSIS — R338 Other retention of urine: Secondary | ICD-10-CM | POA: Diagnosis not present

## 2021-09-26 DIAGNOSIS — Z955 Presence of coronary angioplasty implant and graft: Secondary | ICD-10-CM

## 2021-09-26 DIAGNOSIS — Z794 Long term (current) use of insulin: Secondary | ICD-10-CM

## 2021-09-26 DIAGNOSIS — Z833 Family history of diabetes mellitus: Secondary | ICD-10-CM

## 2021-09-26 DIAGNOSIS — R0902 Hypoxemia: Secondary | ICD-10-CM | POA: Diagnosis not present

## 2021-09-26 DIAGNOSIS — M25552 Pain in left hip: Secondary | ICD-10-CM | POA: Diagnosis not present

## 2021-09-26 DIAGNOSIS — Z7901 Long term (current) use of anticoagulants: Secondary | ICD-10-CM

## 2021-09-26 DIAGNOSIS — I440 Atrioventricular block, first degree: Secondary | ICD-10-CM | POA: Diagnosis present

## 2021-09-26 DIAGNOSIS — I4819 Other persistent atrial fibrillation: Secondary | ICD-10-CM | POA: Diagnosis present

## 2021-09-26 DIAGNOSIS — J9690 Respiratory failure, unspecified, unspecified whether with hypoxia or hypercapnia: Secondary | ICD-10-CM

## 2021-09-26 DIAGNOSIS — G4700 Insomnia, unspecified: Secondary | ICD-10-CM | POA: Diagnosis not present

## 2021-09-26 DIAGNOSIS — Z9861 Coronary angioplasty status: Secondary | ICD-10-CM

## 2021-09-26 DIAGNOSIS — R269 Unspecified abnormalities of gait and mobility: Secondary | ICD-10-CM | POA: Diagnosis present

## 2021-09-26 DIAGNOSIS — Z8673 Personal history of transient ischemic attack (TIA), and cerebral infarction without residual deficits: Secondary | ICD-10-CM

## 2021-09-26 DIAGNOSIS — Z515 Encounter for palliative care: Secondary | ICD-10-CM

## 2021-09-26 DIAGNOSIS — G4733 Obstructive sleep apnea (adult) (pediatric): Secondary | ICD-10-CM | POA: Diagnosis present

## 2021-09-26 DIAGNOSIS — M25551 Pain in right hip: Secondary | ICD-10-CM | POA: Diagnosis not present

## 2021-09-26 DIAGNOSIS — I5043 Acute on chronic combined systolic (congestive) and diastolic (congestive) heart failure: Secondary | ICD-10-CM | POA: Diagnosis present

## 2021-09-26 DIAGNOSIS — F05 Delirium due to known physiological condition: Secondary | ICD-10-CM | POA: Diagnosis not present

## 2021-09-26 DIAGNOSIS — C3491 Malignant neoplasm of unspecified part of right bronchus or lung: Secondary | ICD-10-CM | POA: Diagnosis present

## 2021-09-26 DIAGNOSIS — R0600 Dyspnea, unspecified: Secondary | ICD-10-CM

## 2021-09-26 DIAGNOSIS — J9621 Acute and chronic respiratory failure with hypoxia: Secondary | ICD-10-CM | POA: Diagnosis present

## 2021-09-26 DIAGNOSIS — E785 Hyperlipidemia, unspecified: Secondary | ICD-10-CM | POA: Diagnosis present

## 2021-09-26 DIAGNOSIS — Z6833 Body mass index (BMI) 33.0-33.9, adult: Secondary | ICD-10-CM

## 2021-09-26 LAB — I-STAT ARTERIAL BLOOD GAS, ED
Acid-Base Excess: 2 mmol/L (ref 0.0–2.0)
Bicarbonate: 25.8 mmol/L (ref 20.0–28.0)
Calcium, Ion: 1.16 mmol/L (ref 1.15–1.40)
HCT: 34 % — ABNORMAL LOW (ref 39.0–52.0)
Hemoglobin: 11.6 g/dL — ABNORMAL LOW (ref 13.0–17.0)
O2 Saturation: 97 %
Potassium: 4 mmol/L (ref 3.5–5.1)
Sodium: 139 mmol/L (ref 135–145)
TCO2: 27 mmol/L (ref 22–32)
pCO2 arterial: 37.5 mmHg (ref 32.0–48.0)
pH, Arterial: 7.446 (ref 7.350–7.450)
pO2, Arterial: 90 mmHg (ref 83.0–108.0)

## 2021-09-26 MED ORDER — IPRATROPIUM BROMIDE 0.02 % IN SOLN
0.5000 mg | Freq: Once | RESPIRATORY_TRACT | Status: AC
  Administered 2021-09-26: 0.5 mg via RESPIRATORY_TRACT

## 2021-09-26 MED ORDER — ALBUTEROL SULFATE (2.5 MG/3ML) 0.083% IN NEBU
2.5000 mg | INHALATION_SOLUTION | Freq: Once | RESPIRATORY_TRACT | Status: AC
  Administered 2021-09-26: 2.5 mg via RESPIRATORY_TRACT

## 2021-09-26 MED ORDER — FUROSEMIDE 10 MG/ML IJ SOLN
40.0000 mg | Freq: Once | INTRAMUSCULAR | Status: AC
  Administered 2021-09-27: 40 mg via INTRAVENOUS
  Filled 2021-09-26: qty 4

## 2021-09-26 NOTE — ED Triage Notes (Signed)
Pt BIB [facility] from home for respiratory distress.  Pt has hx of stage 4 lung cancer.  Pt was found at home on 3L [place] with extended oxygen tubing satting at 52%.    Initial VS were 150/76 HR 88.  On arrival Pt was 90% on 8L [place] and a Neb running at 8L with 5 albuterol and 0.5 atrovent.

## 2021-09-27 ENCOUNTER — Other Ambulatory Visit (HOSPITAL_COMMUNITY): Payer: Medicare Other

## 2021-09-27 ENCOUNTER — Other Ambulatory Visit: Payer: Self-pay

## 2021-09-27 DIAGNOSIS — N179 Acute kidney failure, unspecified: Secondary | ICD-10-CM

## 2021-09-27 DIAGNOSIS — R531 Weakness: Secondary | ICD-10-CM

## 2021-09-27 DIAGNOSIS — J9601 Acute respiratory failure with hypoxia: Secondary | ICD-10-CM

## 2021-09-27 DIAGNOSIS — E1065 Type 1 diabetes mellitus with hyperglycemia: Secondary | ICD-10-CM | POA: Diagnosis present

## 2021-09-27 DIAGNOSIS — C3491 Malignant neoplasm of unspecified part of right bronchus or lung: Secondary | ICD-10-CM

## 2021-09-27 DIAGNOSIS — E669 Obesity, unspecified: Secondary | ICD-10-CM | POA: Diagnosis present

## 2021-09-27 DIAGNOSIS — I4891 Unspecified atrial fibrillation: Secondary | ICD-10-CM | POA: Diagnosis not present

## 2021-09-27 DIAGNOSIS — I350 Nonrheumatic aortic (valve) stenosis: Secondary | ICD-10-CM | POA: Diagnosis present

## 2021-09-27 DIAGNOSIS — R5381 Other malaise: Secondary | ICD-10-CM

## 2021-09-27 DIAGNOSIS — J90 Pleural effusion, not elsewhere classified: Secondary | ICD-10-CM | POA: Diagnosis not present

## 2021-09-27 DIAGNOSIS — I483 Typical atrial flutter: Secondary | ICD-10-CM | POA: Diagnosis present

## 2021-09-27 DIAGNOSIS — I272 Pulmonary hypertension, unspecified: Secondary | ICD-10-CM

## 2021-09-27 DIAGNOSIS — N1832 Chronic kidney disease, stage 3b: Secondary | ICD-10-CM | POA: Diagnosis present

## 2021-09-27 DIAGNOSIS — D638 Anemia in other chronic diseases classified elsewhere: Secondary | ICD-10-CM

## 2021-09-27 DIAGNOSIS — I4819 Other persistent atrial fibrillation: Secondary | ICD-10-CM | POA: Diagnosis present

## 2021-09-27 DIAGNOSIS — I5041 Acute combined systolic (congestive) and diastolic (congestive) heart failure: Secondary | ICD-10-CM | POA: Diagnosis not present

## 2021-09-27 DIAGNOSIS — I2510 Atherosclerotic heart disease of native coronary artery without angina pectoris: Secondary | ICD-10-CM

## 2021-09-27 DIAGNOSIS — R0603 Acute respiratory distress: Secondary | ICD-10-CM | POA: Diagnosis not present

## 2021-09-27 DIAGNOSIS — R0602 Shortness of breath: Secondary | ICD-10-CM | POA: Diagnosis not present

## 2021-09-27 DIAGNOSIS — I2721 Secondary pulmonary arterial hypertension: Secondary | ICD-10-CM | POA: Diagnosis not present

## 2021-09-27 DIAGNOSIS — E039 Hypothyroidism, unspecified: Secondary | ICD-10-CM | POA: Diagnosis present

## 2021-09-27 DIAGNOSIS — D696 Thrombocytopenia, unspecified: Secondary | ICD-10-CM | POA: Diagnosis not present

## 2021-09-27 DIAGNOSIS — I517 Cardiomegaly: Secondary | ICD-10-CM | POA: Diagnosis not present

## 2021-09-27 DIAGNOSIS — I959 Hypotension, unspecified: Secondary | ICD-10-CM | POA: Diagnosis not present

## 2021-09-27 DIAGNOSIS — Z515 Encounter for palliative care: Secondary | ICD-10-CM

## 2021-09-27 DIAGNOSIS — E1042 Type 1 diabetes mellitus with diabetic polyneuropathy: Secondary | ICD-10-CM | POA: Diagnosis not present

## 2021-09-27 DIAGNOSIS — D631 Anemia in chronic kidney disease: Secondary | ICD-10-CM | POA: Diagnosis not present

## 2021-09-27 DIAGNOSIS — I10 Essential (primary) hypertension: Secondary | ICD-10-CM

## 2021-09-27 DIAGNOSIS — Z6833 Body mass index (BMI) 33.0-33.9, adult: Secondary | ICD-10-CM | POA: Diagnosis not present

## 2021-09-27 DIAGNOSIS — Z7189 Other specified counseling: Secondary | ICD-10-CM

## 2021-09-27 DIAGNOSIS — G9349 Other encephalopathy: Secondary | ICD-10-CM | POA: Diagnosis not present

## 2021-09-27 DIAGNOSIS — F05 Delirium due to known physiological condition: Secondary | ICD-10-CM | POA: Diagnosis not present

## 2021-09-27 DIAGNOSIS — R7989 Other specified abnormal findings of blood chemistry: Secondary | ICD-10-CM

## 2021-09-27 DIAGNOSIS — R0600 Dyspnea, unspecified: Secondary | ICD-10-CM | POA: Diagnosis not present

## 2021-09-27 DIAGNOSIS — Z9861 Coronary angioplasty status: Secondary | ICD-10-CM

## 2021-09-27 DIAGNOSIS — I5021 Acute systolic (congestive) heart failure: Secondary | ICD-10-CM | POA: Diagnosis not present

## 2021-09-27 DIAGNOSIS — J9621 Acute and chronic respiratory failure with hypoxia: Secondary | ICD-10-CM | POA: Diagnosis not present

## 2021-09-27 DIAGNOSIS — J9611 Chronic respiratory failure with hypoxia: Secondary | ICD-10-CM

## 2021-09-27 DIAGNOSIS — J810 Acute pulmonary edema: Secondary | ICD-10-CM | POA: Diagnosis not present

## 2021-09-27 DIAGNOSIS — E1022 Type 1 diabetes mellitus with diabetic chronic kidney disease: Secondary | ICD-10-CM | POA: Diagnosis not present

## 2021-09-27 DIAGNOSIS — I5043 Acute on chronic combined systolic (congestive) and diastolic (congestive) heart failure: Secondary | ICD-10-CM | POA: Diagnosis present

## 2021-09-27 DIAGNOSIS — E1021 Type 1 diabetes mellitus with diabetic nephropathy: Secondary | ICD-10-CM

## 2021-09-27 DIAGNOSIS — G9340 Encephalopathy, unspecified: Secondary | ICD-10-CM | POA: Diagnosis not present

## 2021-09-27 DIAGNOSIS — Z66 Do not resuscitate: Secondary | ICD-10-CM | POA: Diagnosis not present

## 2021-09-27 DIAGNOSIS — I130 Hypertensive heart and chronic kidney disease with heart failure and stage 1 through stage 4 chronic kidney disease, or unspecified chronic kidney disease: Secondary | ICD-10-CM | POA: Diagnosis not present

## 2021-09-27 DIAGNOSIS — Z20822 Contact with and (suspected) exposure to covid-19: Secondary | ICD-10-CM | POA: Diagnosis not present

## 2021-09-27 DIAGNOSIS — I4892 Unspecified atrial flutter: Secondary | ICD-10-CM | POA: Diagnosis not present

## 2021-09-27 DIAGNOSIS — N183 Chronic kidney disease, stage 3 unspecified: Secondary | ICD-10-CM

## 2021-09-27 LAB — CBC WITH DIFFERENTIAL/PLATELET
Abs Immature Granulocytes: 0.05 10*3/uL (ref 0.00–0.07)
Abs Immature Granulocytes: 0.03 K/uL (ref 0.00–0.07)
Basophils Absolute: 0 10*3/uL (ref 0.0–0.1)
Basophils Absolute: 0 K/uL (ref 0.0–0.1)
Basophils Relative: 1 %
Basophils Relative: 1 %
Eosinophils Absolute: 0 10*3/uL (ref 0.0–0.5)
Eosinophils Absolute: 0 K/uL (ref 0.0–0.5)
Eosinophils Relative: 0 %
Eosinophils Relative: 0 %
HCT: 29.4 % — ABNORMAL LOW (ref 39.0–52.0)
HCT: 28.8 % — ABNORMAL LOW (ref 39.0–52.0)
Hemoglobin: 8.7 g/dL — ABNORMAL LOW (ref 13.0–17.0)
Hemoglobin: 8.8 g/dL — ABNORMAL LOW (ref 13.0–17.0)
Immature Granulocytes: 1 %
Immature Granulocytes: 0 %
Lymphocytes Relative: 11 %
Lymphocytes Relative: 17 %
Lymphs Abs: 0.9 10*3/uL (ref 0.7–4.0)
Lymphs Abs: 1.2 K/uL (ref 0.7–4.0)
MCH: 28.5 pg (ref 26.0–34.0)
MCH: 29 pg (ref 26.0–34.0)
MCHC: 29.6 g/dL — ABNORMAL LOW (ref 30.0–36.0)
MCHC: 30.6 g/dL (ref 30.0–36.0)
MCV: 96.4 fL (ref 80.0–100.0)
MCV: 95 fL (ref 80.0–100.0)
Monocytes Absolute: 0.9 10*3/uL (ref 0.1–1.0)
Monocytes Absolute: 0.9 K/uL (ref 0.1–1.0)
Monocytes Relative: 11 %
Monocytes Relative: 13 %
Neutro Abs: 6.3 10*3/uL (ref 1.7–7.7)
Neutro Abs: 4.7 K/uL (ref 1.7–7.7)
Neutrophils Relative %: 76 %
Neutrophils Relative %: 69 %
Platelets: 121 10*3/uL — ABNORMAL LOW (ref 150–400)
Platelets: 96 K/uL — ABNORMAL LOW (ref 150–400)
RBC: 3.05 MIL/uL — ABNORMAL LOW (ref 4.22–5.81)
RBC: 3.03 MIL/uL — ABNORMAL LOW (ref 4.22–5.81)
RDW: 17.5 % — ABNORMAL HIGH (ref 11.5–15.5)
RDW: 17.3 % — ABNORMAL HIGH (ref 11.5–15.5)
Smear Review: DECREASED
WBC: 8.1 10*3/uL (ref 4.0–10.5)
WBC: 6.9 K/uL (ref 4.0–10.5)
nRBC: 0 % (ref 0.0–0.2)
nRBC: 0 % (ref 0.0–0.2)

## 2021-09-27 LAB — GLUCOSE, CAPILLARY
Glucose-Capillary: 138 mg/dL — ABNORMAL HIGH (ref 70–99)
Glucose-Capillary: 168 mg/dL — ABNORMAL HIGH (ref 70–99)
Glucose-Capillary: 222 mg/dL — ABNORMAL HIGH (ref 70–99)
Glucose-Capillary: 182 mg/dL — ABNORMAL HIGH (ref 70–99)

## 2021-09-27 LAB — COMPREHENSIVE METABOLIC PANEL
ALT: 11 U/L (ref 0–44)
ALT: 8 U/L (ref 0–44)
AST: 17 U/L (ref 15–41)
AST: 15 U/L (ref 15–41)
Albumin: 2.8 g/dL — ABNORMAL LOW (ref 3.5–5.0)
Albumin: 2.6 g/dL — ABNORMAL LOW (ref 3.5–5.0)
Alkaline Phosphatase: 92 U/L (ref 38–126)
Alkaline Phosphatase: 89 U/L (ref 38–126)
Anion gap: 11 (ref 5–15)
Anion gap: 8 (ref 5–15)
BUN: 39 mg/dL — ABNORMAL HIGH (ref 8–23)
BUN: 38 mg/dL — ABNORMAL HIGH (ref 8–23)
CO2: 23 mmol/L (ref 22–32)
CO2: 24 mmol/L (ref 22–32)
Calcium: 8.4 mg/dL — ABNORMAL LOW (ref 8.9–10.3)
Calcium: 8.4 mg/dL — ABNORMAL LOW (ref 8.9–10.3)
Chloride: 103 mmol/L (ref 98–111)
Chloride: 105 mmol/L (ref 98–111)
Creatinine, Ser: 2.22 mg/dL — ABNORMAL HIGH (ref 0.61–1.24)
Creatinine, Ser: 2.27 mg/dL — ABNORMAL HIGH (ref 0.61–1.24)
GFR, Estimated: 29 mL/min — ABNORMAL LOW (ref >60)
GFR, Estimated: 28 mL/min — ABNORMAL LOW (ref >60)
Glucose, Bld: 145 mg/dL — ABNORMAL HIGH (ref 70–99)
Glucose, Bld: 169 mg/dL — ABNORMAL HIGH (ref 70–99)
Potassium: 4.2 mmol/L (ref 3.5–5.1)
Potassium: 4.3 mmol/L (ref 3.5–5.1)
Sodium: 137 mmol/L (ref 135–145)
Sodium: 137 mmol/L (ref 135–145)
Total Bilirubin: 0.9 mg/dL (ref 0.3–1.2)
Total Bilirubin: 0.7 mg/dL (ref 0.3–1.2)
Total Protein: 5.8 g/dL — ABNORMAL LOW (ref 6.5–8.1)
Total Protein: 5.6 g/dL — ABNORMAL LOW (ref 6.5–8.1)

## 2021-09-27 LAB — URINALYSIS, ROUTINE W REFLEX MICROSCOPIC
Bacteria, UA: NONE SEEN
Bilirubin Urine: NEGATIVE
Glucose, UA: NEGATIVE mg/dL
Hgb urine dipstick: NEGATIVE
Ketones, ur: NEGATIVE mg/dL
Leukocytes,Ua: NEGATIVE
Nitrite: NEGATIVE
Protein, ur: 30 mg/dL — AB
Specific Gravity, Urine: 1.011 (ref 1.005–1.030)
pH: 5 (ref 5.0–8.0)

## 2021-09-27 LAB — PROTIME-INR
INR: 1.8 — ABNORMAL HIGH (ref 0.8–1.2)
Prothrombin Time: 20.4 seconds — ABNORMAL HIGH (ref 11.4–15.2)

## 2021-09-27 LAB — TROPONIN I (HIGH SENSITIVITY)
Troponin I (High Sensitivity): 76 ng/L — ABNORMAL HIGH (ref <18)
Troponin I (High Sensitivity): 83 ng/L — ABNORMAL HIGH (ref <18)

## 2021-09-27 LAB — BRAIN NATRIURETIC PEPTIDE: B Natriuretic Peptide: 1183.2 pg/mL — ABNORMAL HIGH (ref 0.0–100.0)

## 2021-09-27 LAB — MAGNESIUM: Magnesium: 2.2 mg/dL (ref 1.7–2.4)

## 2021-09-27 LAB — RESP PANEL BY RT-PCR (FLU A&B, COVID) ARPGX2
Influenza A by PCR: NEGATIVE
Influenza B by PCR: NEGATIVE
SARS Coronavirus 2 by RT PCR: NEGATIVE

## 2021-09-27 LAB — LACTIC ACID, PLASMA
Lactic Acid, Venous: 1 mmol/L (ref 0.5–1.9)
Lactic Acid, Venous: 1.3 mmol/L (ref 0.5–1.9)

## 2021-09-27 LAB — APTT: aPTT: 48 seconds — ABNORMAL HIGH (ref 24–36)

## 2021-09-27 MED ORDER — ALBUTEROL SULFATE (2.5 MG/3ML) 0.083% IN NEBU
2.5000 mg | INHALATION_SOLUTION | RESPIRATORY_TRACT | Status: DC | PRN
  Administered 2021-09-27 – 2021-09-28 (×2): 2.5 mg via RESPIRATORY_TRACT
  Filled 2021-09-27 (×2): qty 3

## 2021-09-27 MED ORDER — LEVOTHYROXINE SODIUM 88 MCG PO TABS
88.0000 ug | ORAL_TABLET | Freq: Every morning | ORAL | Status: DC
  Administered 2021-09-27 – 2021-09-28 (×2): 88 ug via ORAL
  Filled 2021-09-27 (×3): qty 1

## 2021-09-27 MED ORDER — ACETAMINOPHEN 325 MG PO TABS
650.0000 mg | ORAL_TABLET | Freq: Four times a day (QID) | ORAL | Status: DC | PRN
  Administered 2021-09-27 – 2021-10-01 (×3): 650 mg via ORAL
  Filled 2021-09-27 (×3): qty 2

## 2021-09-27 MED ORDER — TAMSULOSIN HCL 0.4 MG PO CAPS
0.4000 mg | ORAL_CAPSULE | Freq: Every day | ORAL | Status: DC
  Administered 2021-09-27 – 2021-10-03 (×6): 0.4 mg via ORAL
  Filled 2021-09-27 (×6): qty 1

## 2021-09-27 MED ORDER — SODIUM CHLORIDE 0.9% FLUSH: 3.0000 mL | Freq: Two times a day (BID) | INTRAVENOUS | Status: DC

## 2021-09-27 MED ORDER — FUROSEMIDE 10 MG/ML IJ SOLN
80.0000 mg | Freq: Two times a day (BID) | INTRAMUSCULAR | Status: DC
  Administered 2021-09-27 – 2021-09-28 (×3): 80 mg via INTRAVENOUS
  Filled 2021-09-27 (×3): qty 8

## 2021-09-27 MED ORDER — ACETAMINOPHEN 325 MG RE SUPP
650.0000 mg | Freq: Four times a day (QID) | RECTAL | Status: DC | PRN
  Administered 2021-09-28: 650 mg via RECTAL
  Filled 2021-09-27: qty 2
  Filled 2021-09-27: qty 1
  Filled 2021-09-27: qty 2

## 2021-09-27 MED ORDER — ROSUVASTATIN CALCIUM 20 MG PO TABS
20.0000 mg | ORAL_TABLET | Freq: Every day | ORAL | Status: DC
  Administered 2021-09-27 – 2021-10-03 (×7): 20 mg via ORAL
  Filled 2021-09-27 (×7): qty 1

## 2021-09-27 MED ORDER — SODIUM CHLORIDE 0.9% FLUSH: 3.0000 mL | INTRAVENOUS | Status: DC | PRN

## 2021-09-27 MED ORDER — APIXABAN 5 MG PO TABS: 5.0000 mg | ORAL_TABLET | Freq: Two times a day (BID) | ORAL | Status: DC

## 2021-09-27 MED ORDER — APIXABAN 2.5 MG PO TABS
2.5000 mg | ORAL_TABLET | Freq: Two times a day (BID) | ORAL | Status: DC
  Administered 2021-09-27 – 2021-09-28 (×3): 2.5 mg via ORAL
  Filled 2021-09-27 (×3): qty 1

## 2021-09-27 MED ORDER — SODIUM CHLORIDE 0.9% FLUSH
10.0000 mL | Freq: Two times a day (BID) | INTRAVENOUS | Status: DC
  Administered 2021-09-27 – 2021-10-03 (×13): 10 mL via INTRAVENOUS

## 2021-09-27 MED ORDER — SODIUM CHLORIDE 0.9 % IV SOLN
1.0000 g | INTRAVENOUS | Status: DC
Start: 2021-09-27 — End: 2021-09-28
  Administered 2021-09-27 – 2021-09-28 (×2): 1 g via INTRAVENOUS
  Filled 2021-09-27 (×2): qty 10

## 2021-09-27 MED ORDER — INSULIN ASPART 100 UNIT/ML IJ SOLN: 0.0000 [IU] | Freq: Every day | INTRAMUSCULAR | Status: DC

## 2021-09-27 MED ORDER — SODIUM CHLORIDE 0.9% FLUSH
10.0000 mL | INTRAVENOUS | Status: DC | PRN
  Administered 2021-09-27 (×2): 10 mL via INTRAVENOUS

## 2021-09-27 MED ORDER — SOTORASIB 120 MG PO TABS
960.0000 mg | ORAL_TABLET | Freq: Every day | ORAL | Status: DC
  Administered 2021-09-27 – 2021-10-03 (×5): 960 mg via ORAL
  Filled 2021-09-27 (×2): qty 8
  Filled 2021-09-27 (×2): qty 1
  Filled 2021-09-27 (×3): qty 8
  Filled 2021-09-27: qty 1

## 2021-09-27 MED ORDER — AMIODARONE HCL 200 MG PO TABS
200.0000 mg | ORAL_TABLET | Freq: Two times a day (BID) | ORAL | Status: DC
  Administered 2021-09-27 – 2021-10-06 (×19): 200 mg via ORAL
  Filled 2021-09-27 (×19): qty 1

## 2021-09-27 MED ORDER — AMIODARONE HCL 200 MG PO TABS: 200.0000 mg | ORAL_TABLET | Freq: Two times a day (BID) | ORAL | Status: DC

## 2021-09-27 MED ORDER — INSULIN ASPART 100 UNIT/ML IJ SOLN
0.0000 [IU] | Freq: Three times a day (TID) | INTRAMUSCULAR | Status: DC
  Administered 2021-09-27: 3 [IU] via SUBCUTANEOUS
  Administered 2021-09-27: 2 [IU] via SUBCUTANEOUS
  Administered 2021-09-27: 5 [IU] via SUBCUTANEOUS
  Administered 2021-09-28 – 2021-09-29 (×2): 3 [IU] via SUBCUTANEOUS
  Administered 2021-09-30 (×2): 2 [IU] via SUBCUTANEOUS
  Administered 2021-09-30 – 2021-10-01 (×2): 5 [IU] via SUBCUTANEOUS
  Administered 2021-10-01: 3 [IU] via SUBCUTANEOUS
  Administered 2021-10-01 – 2021-10-02 (×3): 5 [IU] via SUBCUTANEOUS
  Administered 2021-10-02 – 2021-10-03 (×3): 3 [IU] via SUBCUTANEOUS
  Administered 2021-10-03: 5 [IU] via SUBCUTANEOUS
  Administered 2021-10-04: 3 [IU] via SUBCUTANEOUS

## 2021-09-27 NOTE — Assessment & Plan Note (Signed)
Chronic. 

## 2021-09-27 NOTE — Assessment & Plan Note (Signed)
Stable

## 2021-09-27 NOTE — Assessment & Plan Note (Signed)
Pt now on 6 L/min. Usually only on 2 L/min.

## 2021-09-27 NOTE — Assessment & Plan Note (Signed)
Continue insulin.

## 2021-09-27 NOTE — H&P (Signed)
History and Physical    Beauford S Martinique YIF:[id number] DOB: Dec 25, 1940 DOA: 09/26/2021  PCP: [name], MD   Patient coming from: Home  I have personally briefly reviewed patient's old medical records in [place]  CC: weakness, SOB HPI: 80 year old male with a history of stage IV lung cancer, type 1 diabetes, obesity, chronic hypoxic respiratory failure, combined systolic diastolic heart failure, atrial fibrillation, chronic anticoagulation who presents to the ER today with a 2 to 5-7-day history of worsening weakness, 1 day history of shortness of breath.  Wife states the patient has been feeling weak for about a 1 week.  She took him to see her his PCP.  Initially thought the patient had a UTI.  They decided this may be a prostate infection.  Patient placed on oral Keflex.  Patient has been feeling increasingly weak.  Patient normally ambulates with a walker but was but not been able to walk over the last 2 to 3 days.  Wife has been pushing him around with a wheelchair.  Today she try to get the patient into bed but EMS fell at the wheelchair.  She noticed that the patient was breathing hard.  EMS was called.  He was noted be hypoxic with his 2 L of oxygen.  On arrival to the ER, sats were 88% on 6 L.  He dropped to 52% on room air.  Laboratory evaluation showed a beta peptide of 1183.  COVID test was negative.  BUN of 35, creatinine 2.2.  Lactic acid normal is 1.  White count was 8.1.  Hemoglobin of 8.7.  ABG pH 7.44 PCO2 35 PO2 of 90.  Chest x-ray demonstrated increased pulmonary markings consistent with edema.  Wife states the patient has been set up to undergo TEE cardioversion by cardiology this next week.  This is due to atrial flutter.  Patient has been orally loading with amiodarone at home.  Due to acute CHF and acute on chronic respiratory failure, [facility] contacted for admission.    ED Course: pt noted to be hypoxic. Labs/cxr consistent with Chf  exacerbation.  Review of Systems:  Review of Systems  Unable to perform ROS: Mental acuity  Pt with altered mental status. HPI and ROS obtained from pt's wife.  Past Medical History:  Diagnosis Date   Aortic stenosis, mild 11/14/2013   Aortic valve sclerosis 03/29/2015   Bilateral leg edema 05/21/2014   CAD (coronary artery disease)    Chronic diastolic congestive heart failure (HCC)    Chronic kidney disease (CKD), stage III (moderate) (HCC)    Diabetes 1.5, managed as type 1 ([place]) 02/04/2013   Diabetic peripheral neuropathy associated with type 1 diabetes mellitus ([place]) 11/14/2013   Dysphagia    Dyspnea on exertion 03/21/2015   Exogenous obesity    Gout    Heart attack ([place])    History of nuclear stress test 07/2011   dipyridamole; fixed inferolateral defect, worse at stress than rest; no reversible ischemia; low risk scan    Hyperlipidemia    Hypertension    Hypothyroidism    Insulin dependent diabetes mellitus    Left foot drop    Left main coronary artery disease 03/28/2015   lung ca dx'd 08/2020   Macular pucker, right eye 01/29/2021   Vitrectomy membrane peel right eye 02-12-2021   Memory loss    Obesity (BMI 30-39.9) 11/14/2013   Obstructive sleep apnea 03/21/2015   OSA on CPAP    uses a cpap   Peptic ulcer  with hemorrhage 03/28/2015   Peripheral neuropathy    Pneumonia    Rhabdomyolysis    S/P CABG x 2 03/29/2015   LIMA to Diagonal, SVG to OM, EVH via right thigh   Stroke ([place])    L patietal with small scattered lacunar infarcts   Thrombocytopenia ([place]) 03/21/2015   Venous insufficiency    Weakness generalized 03/21/2015    Past Surgical History:  Procedure Laterality Date   BACK SURGERY  2002   lumbosacral. 11 back surgeries total   BRONCHIAL BIOPSY  08/06/2020   Procedure: BRONCHIAL BIOPSIES;  Surgeon: Collene Gobble, MD;  Location: [facility] ENDOSCOPY;  Service: Pulmonary;;   BRONCHIAL BRUSHINGS  08/06/2020   Procedure: BRONCHIAL BRUSHINGS;  Surgeon: Collene Gobble, MD;   Location: [place];  Service: Pulmonary;;   BRONCHIAL NEEDLE ASPIRATION BIOPSY  08/06/2020   Procedure: BRONCHIAL NEEDLE ASPIRATION BIOPSIES;  Surgeon: Collene Gobble, MD;  Location: [facility] ENDOSCOPY;  Service: Pulmonary;;   CARDIOVERSION N/A 05/06/2021   Procedure: CARDIOVERSION;  Surgeon: Geralynn Rile, MD;  Location: [place];  Service: Cardiovascular;  Laterality: N/A;   Carotid Doppler  03/2013   bilat bulb/prox ICAs - mild amount of fibrous plaque with no evidence of diameter reduction   CARPAL TUNNEL RELEASE Bilateral 08/09/2014   Procedure: BILATERAL CARPAL TUNNEL RELEASE;  Surgeon: Daryll Brod, MD;  Location: [place];  Service: Orthopedics;  Laterality: Bilateral;  ANESTHESIA:  IV REGIONAL BIL FAB   CHOLECYSTECTOMY     COLONOSCOPY     CORONARY ANGIOPLASTY  10/13/1996   CORONARY ANGIOPLASTY  09/21/1989   emergency PTCA   CORONARY ANGIOPLASTY  10/13/1996   Multi-Link diagonal & OD stenting (Dr. Marella Chimes)   [place]  12/03/1997   disease of mid DX-1 ~50% & in mid PLA & PDA (distal lesions) (Dr. Marella Chimes)    CORONARY ANGIOPLASTY  10/14/1999   progression of disease distal PLA & PDA; progression of disease prox RCA - moderate (Dr. Marella Chimes)    CORONARY ANGIOPLASTY WITH STENT PLACEMENT  04/04/2004   4.0x34m non-DES (thrombectomy via AngioJet) to RCA for high grade stenosis (Dr. RMarella Chimes   CORONARY ARTERY BYPASS GRAFT N/A 03/29/2015   Procedure: CORONARY ARTERY BYPASS GRAFTING TIMES TWO USING LEFT INTERNAL MAMMARY ARTERY AND RIGHT LEG GREATER SAPHENOUS VEIN HARVESTED ENDOSCOPICALLY.;  Surgeon: CRexene Alberts MD;  [place]  Service: Open Heart Surgery;  Laterality: N/A;   ESOPHAGOGASTRODUODENOSCOPY N/A 03/27/2015   Procedure: ESOPHAGOGASTRODUODENOSCOPY (EGD);  Surgeon: MClarene Essex MD;  [facility];  Service: Endoscopy;  Laterality: N/A;  possible dilation   ESOPHAGOGASTRODUODENOSCOPY N/A 10/05/2020    Procedure: ESOPHAGOGASTRODUODENOSCOPY (EGD);  Surgeon: OArta Silence MD;  [facility];  Service: Endoscopy;  Laterality: N/A;   EYE SURGERY Right    macular fold-02/2021   FINE NEEDLE ASPIRATION  08/06/2020   Procedure: FINE NEEDLE ASPIRATION (FNA) LINEAR;  Surgeon: BCollene Gobble MD;  [facility];  Service: Pulmonary;;   IR IMAGING GUIDED PORT INSERTION  10/07/2020   LEFT HEART CATHETERIZATION WITH CORONARY ANGIOGRAM N/A 03/28/2015   Procedure: LEFT HEART CATHETERIZATION WITH CORONARY ANGIOGRAM;  Surgeon: Peter M JMartinique MD;  [facility];  Service: Cardiovascular;  Laterality: N/A;   SINUS ENDO W/FUSION     TEE WITHOUT CARDIOVERSION N/A 05/06/2021   Procedure: TRANSESOPHAGEAL ECHOCARDIOGRAM (TEE);  Surgeon: OGeralynn Rile MD;  [place]  Service: Cardiovascular;  Laterality: N/A;   TONSILLECTOMY     TRANSTHORACIC ECHOCARDIOGRAM  08/08/2013  EF 55-60%, mild conc hypertrophy, grade 1 diastolic dysfunction; AV with mild stenosis; LA & RA mildly dilated   VIDEO BRONCHOSCOPY WITH ENDOBRONCHIAL NAVIGATION N/A 08/06/2020   Procedure: VIDEO BRONCHOSCOPY WITH ENDOBRONCHIAL NAVIGATION;  Surgeon: Collene Gobble, MD;  Location: [facility] ENDOSCOPY;  Service: Pulmonary;  Laterality: N/A;   VIDEO BRONCHOSCOPY WITH ENDOBRONCHIAL ULTRASOUND N/A 08/06/2020   Procedure: VIDEO BRONCHOSCOPY WITH ENDOBRONCHIAL ULTRASOUND;  Surgeon: Collene Gobble, MD;  Location: [place] ENDOSCOPY;  Service: Pulmonary;  Laterality: N/A;     reports that he quit smoking about 47 years ago. His smoking use included cigarettes. He has a 40.00 pack-year smoking history. He has never used smokeless tobacco. He reports that he does not drink alcohol and does not use drugs.  Allergies  Allergen Reactions   Actos [Pioglitazone] Swelling   Spironolactone Itching   Metformin And Related Nausea Only    3.16.2022 pt is currently taking this medication at home   Niaspan [Niacin Er] Itching  and Rash   Wound Dressing Adhesive Rash    Family History  Problem Relation Age of Onset   Heart disease Mother    Coronary artery disease Father    Cancer Maternal Grandmother    Heart Problems Maternal Grandfather    Diabetes Son        borderline     Prior to Admission medications   Medication Sig Start Date End Date Taking? Authorizing Provider  acetaminophen (TYLENOL) 500 MG tablet Take 1,000 mg by mouth every 6 (six) hours as needed for moderate pain or headache.   Yes [provider]  albuterol (VENTOLIN HFA) 108 (90 Base) MCG/ACT inhaler Inhale 2 puffs into the lungs every 6 (six) hours as needed for wheezing or shortness of breath.   Yes [provider]  allopurinol (ZYLOPRIM) 100 MG tablet TAKE ONE TABLET EVERY DAY Patient taking differently: Take 100 mg by mouth daily. 05/14/21  Yes Hilty, Nadean Corwin, MD  amiodarone (PACERONE) 200 MG tablet Take 1 tablet (200 mg total) by mouth daily. Patient taking differently: Take 200 mg by mouth 2 (two) times daily. 09/02/21  Yes Camnitz, Will Hassell Done, MD  amitriptyline (ELAVIL) 50 MG tablet Take 50 mg by mouth at bedtime. 06/11/21  Yes [provider]  amLODipine (NORVASC) 10 MG tablet TAKE ONE TABLET BY MOUTH ONCE DAILY Patient taking differently: Take 10 mg by mouth daily. 05/13/21  Yes Hilty, Nadean Corwin, MD  apixaban (ELIQUIS) 5 MG TABS tablet Take 1 tablet (5 mg total) by mouth 2 (two) times daily. 03/11/21  Yes Hilty, Nadean Corwin, MD  Blood Glucose Monitoring Suppl (CONTOUR NEXT MONITOR) w/Device KIT 1 Device by Does not apply route 3 (three) times daily. Use to check blood sugars 3 times daily. Dx Code E13.9 07/29/17  Yes Elayne Snare, MD  cephALEXin (KEFLEX) 500 MG capsule Take 500 mg by mouth See admin instructions. Tid x 7 days 09/24/21  Yes [provider]  cetirizine (ZYRTEC) 10 MG tablet Take 10 mg by mouth daily.   Yes [provider]  colchicine 0.6 MG tablet Take 0.6 mg by mouth 3 (three)  times daily as needed (gout flares).   Yes [provider]  CONTOUR NEXT TEST test strip USE 1 STRIP TO CHECK GLUCOSE 4 TIMES DAILY Patient taking differently: USE 1 STRIP TO CHECK GLUCOSE 3 TIMES DAILY 08/25/21  Yes Elayne Snare, MD  furosemide (LASIX) 40 MG tablet Take 2 tablets (80 mg total) by mouth 2 (two) times daily. Patient taking differently: Take  80 mg by mouth in the morning and at bedtime. 8:30 am and 2 pm 07/01/21  Yes Hilty, Nadean Corwin, MD  hydrALAZINE (APRESOLINE) 25 MG tablet Take 37.5 mg by mouth in the morning and at bedtime.   Yes [provider]  insulin degludec (TRESIBA FLEXTOUCH) 200 UNIT/ML FlexTouch Pen Inject 12 Units into the skin at bedtime. Patient taking differently: Inject 6 Units into the skin at bedtime. 05/07/21  Yes Enzo Bi, MD  insulin lispro (HUMALOG) 100 UNIT/ML cartridge Inject 5-15 Units into the skin 3 (three) times daily with meals. Sliding scale.   Yes [provider]  Insulin Pen Needle (EASY COMFORT PEN NEEDLES) 33G X 4 MM MISC 1 each by Does not apply route See admin instructions. Use to inject insulin 5 times daily. 05/31/19  Yes Elayne Snare, MD  isosorbide dinitrate (ISORDIL) 20 MG tablet Take 1 tablet (20 mg total) by mouth in the morning and at bedtime. 05/08/21  Yes Hilty, Nadean Corwin, MD  levothyroxine (SYNTHROID) 88 MCG tablet Take 88 mcg by mouth every morning. 09/12/21  Yes [provider]  lidocaine-prilocaine (EMLA) cream APPLY A TEASPOON OVER PORT SITE AT [place] BEFORE LAB APPOINTMENT. DO NOT RUB IN AND COVER WITH PLASTIC WRAP Patient taking differently: Apply 1 application topically See admin instructions. Apply a teaspoon over port site at least one hour before lab appointment. Do not rub in and cover with plastic wrap. 04/08/21  Yes Curt Bears, MD  lisinopril (ZESTRIL) 10 MG tablet Take 1 tablet (10 mg total) by mouth daily. 05/26/21  Yes Elayne Snare, MD  LUMAKRAS 120 MG TABS Take 8 tablets (911m) by  mouth daily at one time as directed by physician Patient taking differently: Take 960 mg by mouth daily. 09/10/21  Yes MCurt Bears MD  ondansetron (ZOFRAN) 4 MG tablet Take 1 tablet (4 mg total) by mouth every 6 (six) hours as needed for nausea. 10/08/20  Yes Sheikh, Omair Latif, DO  OXYGEN Inhale 2 L into the lungs continuous.   Yes [provider]  potassium chloride SA (K-DUR,KLOR-CON) 20 MEQ tablet Take 20 mEq by mouth 2 (two) times daily.    Yes [provider]  prochlorperazine (COMPAZINE) 10 MG tablet Take 1 tablet (10 mg total) by mouth every 6 (six) hours as needed for nausea or vomiting. 09/02/20  Yes MCurt Bears MD  rosuvastatin (CRESTOR) 20 MG tablet Take 20 mg by mouth daily.   Yes [provider]  Tamsulosin HCl (FLOMAX) 0.4 MG CAPS Take 0.4 mg by mouth at bedtime.   Yes [provider]  vitamin B-12 (CYANOCOBALAMIN) 1000 MCG tablet Take 1,000 mcg by mouth at bedtime.   Yes [provider]    Physical Exam: Vitals:   09/26/21 2330 09/26/21 2334 09/27/21 0007 09/27/21 0130  BP: (!) 122/51   (!) 130/54  Pulse: 81   75  Resp: (!) 27   (!) 32  Temp:  98.7 F (37.1 C)    TempSrc:  Oral    SpO2: 99%   98%  Weight:   100.7 kg   Height:   '5\' 7"'  (1.702 m)     Physical Exam Vitals and nursing note reviewed.  Constitutional:      Comments: Arousable but falls asleep easily. Appears chronically ill  HENT:     Head: Normocephalic and atraumatic.     Nose: Nose normal. No rhinorrhea.  Cardiovascular:     Rate and Rhythm: Normal rate. Rhythm irregular.  Heart sounds: Murmur heard.  Systolic murmur is present with a grade of 3/6.  Pulmonary:     Breath sounds: Examination of the right-middle field reveals rales. Examination of the left-middle field reveals rales. Examination of the right-lower field reveals rales. Examination of the left-lower field reveals rales. Rales present.  Abdominal:     General: Bowel sounds are  normal. There is no distension.     Tenderness: There is no abdominal tenderness. There is no guarding.  Musculoskeletal:     Right lower leg: 2+ Edema present.     Left lower leg: 2+ Edema present.  Skin:    General: Skin is warm and dry.     Capillary Refill: Capillary refill takes less than 2 seconds.     Comments: Bilateral anterior tibial venous stasis changes.  Neurological:     Mental Status: He is disoriented.     Labs on Admission: I have personally reviewed following labs and imaging studies  CBC: Recent Labs  Lab 09/26/21 2354 09/27/21 0020  WBC  --  8.1  NEUTROABS  --  6.3  HGB 11.6* 8.7*  HCT 34.0* 29.4*  MCV  --  96.4  PLT  --  594*   Basic Metabolic Panel: Recent Labs  Lab 09/26/21 2354 09/27/21 0020  NA 139 137  K 4.0 4.2  CL  --  103  CO2  --  23  GLUCOSE  --  145*  BUN  --  39*  CREATININE  --  2.22*  CALCIUM  --  8.4*   GFR: Estimated Creatinine Clearance: 30 mL/min (A) (by C-G formula based on SCr of 2.22 mg/dL (H)). Liver Function Tests: Recent Labs  Lab 09/27/21 0020  AST 17  ALT 11  ALKPHOS 92  BILITOT 0.9  PROT 5.8*  ALBUMIN 2.8*   No results for input(s): LIPASE, AMYLASE in the last 168 hours. No results for input(s): AMMONIA in the last 168 hours. Coagulation Profile: Recent Labs  Lab 09/27/21 0020  INR 1.8*   Cardiac Enzymes: No results for input(s): CKTOTAL, CKMB, CKMBINDEX, TROPONINI in the last 168 hours. BNP (last 3 results) No results for input(s): PROBNP in the last 8760 hours. HbA1C: No results for input(s): HGBA1C in the last 72 hours. CBG: No results for input(s): GLUCAP in the last 168 hours. Lipid Profile: No results for input(s): CHOL, HDL, LDLCALC, TRIG, CHOLHDL, LDLDIRECT in the last 72 hours. Thyroid Function Tests: No results for input(s): TSH, T4TOTAL, FREET4, T3FREE, THYROIDAB in the last 72 hours. Anemia Panel: No results for input(s): VITAMINB12, FOLATE, FERRITIN, TIBC, IRON, RETICCTPCT in the  last 72 hours. Urine analysis:    Component Value Date/Time   COLORURINE YELLOW 02/26/2021 1221   APPEARANCEUR CLEAR 02/26/2021 1221   LABSPEC 1.009 02/26/2021 1221   PHURINE 6.0 02/26/2021 1221   GLUCOSEU NEGATIVE 02/26/2021 1221   GLUCOSEU NEGATIVE 08/29/2019 0959   HGBUR SMALL (A) 02/26/2021 1221   BILIRUBINUR NEGATIVE 02/26/2021 1221   KETONESUR NEGATIVE 02/26/2021 1221   PROTEINUR 100 (A) 02/26/2021 1221   UROBILINOGEN 0.2 08/29/2019 0959   NITRITE NEGATIVE 02/26/2021 1221   LEUKOCYTESUR NEGATIVE 02/26/2021 1221    Radiological Exams on Admission: I have personally reviewed images DG Chest Port 1 View  Result Date: 09/26/2021 CLINICAL DATA:  Respiratory distress. EXAM: PORTABLE CHEST 1 VIEW COMPARISON:  May 01, 2021 FINDINGS: There is stable right-sided venous Port-A-Cath positioning. Multiple sternal wires are seen. Mild, diffuse, chronic appearing increased interstitial lung markings are seen. Mild, stable areas  of atelectasis are seen within the bilateral lung bases and mid left lung. Stable elevation of the right hemidiaphragm is seen. There is no evidence of a pleural effusion or pneumothorax. The cardiac silhouette is enlarged and unchanged in size. A radiopaque fusion plate and screws are seen overlying the cervical spine. IMPRESSION: Chronic appearing increased interstitial lung markings with mild, stable bibasilar and mid left lung atelectasis. Electronically Signed   By: Virgina Norfolk M.D.   On: 09/26/2021 23:11    EKG: I have personally reviewed EKG: afib   Assessment/Plan Principal Problem:   Acute on chronic combined systolic and diastolic CHF (congestive heart failure) (HCC) Active Problems:   Acute respiratory failure with hypoxia (HCC)   CAD S/P percutaneous coronary angioplasty   Type 1 diabetes mellitus with nephropathy (HCC)   Obesity (BMI 30-39.9)   Chronic respiratory failure with hypoxia (HCC) - 2 L/min   Chronic kidney disease (CKD), stage III  (moderate) (HCC)   Essential hypertension   Adenocarcinoma of right lung, stage 4 (HCC)   Atrial fibrillation and flutter (HCC)    Acute on chronic combined systolic and diastolic CHF (congestive heart failure) ([place]) Admit to telemetry bed. Continue with IV lasix 80 mg bid. Update echo. Wife states pt was scheduled for TEE/cardioversion due to afib next week. Pt may benefit from seeing cardiology if he CHF exacerbation is due to tachycardia mediated reasons.  Acute respiratory failure with hypoxia (HCC) Pt now on 6 L/min. Usually only on 2 L/min.  CAD S/P percutaneous coronary angioplasty Stable.  Type 1 diabetes mellitus with nephropathy (HCC) Continue insulin.  Obesity (BMI 30-39.9) Chronic.  Chronic respiratory failure with hypoxia (HCC) - 2 L/min Chronic.  Chronic kidney disease (CKD), stage III (moderate) (HCC) Stable.  Essential hypertension Stable.  Adenocarcinoma of right lung, stage 4 ([place]) Continue oral Lumakras. Told wife to bring this med to hospital.  Atrial fibrillation and flutter ([place]) On amiodarone and eliquis.  DVT prophylaxis: Eliquis Code Status: Full Code Family Communication: discussed with pt's wife at bedside  Disposition Plan: return home  Consults called: none  Admission status: Inpatient, Telemetry bed   Kristopher Oppenheim, DO [facility] 09/27/2021, 2:42 AM

## 2021-09-27 NOTE — Consult Note (Signed)
CARDIOLOGY CONSULT NOTE       Patient ID: Randall Wilkins MRN: [id number] DOB/AGE: [birth year] 80 y.o.  Admit date: 09/26/2021 Referring Physician: Bridgett Larsson Primary Physician: [name], MD Primary Cardiologist: Hilty/Camnitz Reason for Consultation: Afib  Principal Problem:   Acute on chronic combined systolic and diastolic CHF (congestive heart failure) ([place]) Active Problems:   CAD S/P percutaneous coronary angioplasty   Type 1 diabetes mellitus with nephropathy ([place])   Obesity (BMI 30-39.9)   Chronic respiratory failure with hypoxia (HCC) - 2 L/min   Chronic kidney disease (CKD), stage III (moderate) (HCC)   Essential hypertension   Adenocarcinoma of right lung, stage 4 (HCC)   Atrial fibrillation and flutter (HCC)   Acute respiratory failure with hypoxia (HCC)   Acute on chronic combined systolic and diastolic CHF, NYHA class 3 (HCC)   HPI:  80 y.o. with stage 4 lung cancer active chemoRx with right sided port. Admitted with progressive weakness inability to get OOB/ambulate. History of CABG distant with LIMA to LAD and SVG to OM.  Previous stroke and afib with TEE/DCC 05/06/21.  AT that time EF was normal 55-60% with moderate AS mean gradient 24 mmHg He has been on low dose eliquis 2.5 mg bid ( age and Cr > 1.5) with no missed doses. Recent visit with Dr Julien Nordmann 09/10/21 with stable CT results for cancer Currently on oaral Rx with Lumakras.  Ambulation difficult at baseline due to neuropathy and old left foot drop from CVA. On admission Cr elevated 1.83-> 2.22 , Hct 29.4 BNP 1183 and Troponin 76 no trend. Patient has mild dyspnea no palpitations and no chest pain Telemetry with good rate control of afib in 70's.  He had recurrent PAF and seen by Dr Curt Bears started on loading dose 400 mg bid of amiodarone 09/02/21 with plans for [facility] scheduled on 10/01/21 with Dr Radford Pax Wife indicates no missed doses of eliquis   ROS All other systems reviewed and negative except as noted  above  Past Medical History:  Diagnosis Date   Aortic stenosis, mild 11/14/2013   Aortic valve sclerosis 03/29/2015   Bilateral leg edema 05/21/2014   CAD (coronary artery disease)    Chronic diastolic congestive heart failure (HCC)    Chronic kidney disease (CKD), stage III (moderate) (HCC)    Diabetes 1.5, managed as type 1 ([place]) 02/04/2013   Diabetic peripheral neuropathy associated with type 1 diabetes mellitus ([place]) 11/14/2013   Dysphagia    Dyspnea on exertion 03/21/2015   Exogenous obesity    Gout    Heart attack ([place])    History of nuclear stress test 07/2011   dipyridamole; fixed inferolateral defect, worse at stress than rest; no reversible ischemia; low risk scan    Hyperlipidemia    Hypertension    Hypothyroidism    Insulin dependent diabetes mellitus    Left foot drop    Left main coronary artery disease 03/28/2015   lung ca dx'd 08/2020   Macular pucker, right eye 01/29/2021   Vitrectomy membrane peel right eye 02-12-2021   Memory loss    Obesity (BMI 30-39.9) 11/14/2013   Obstructive sleep apnea 03/21/2015   OSA on CPAP    uses a cpap   Peptic ulcer with hemorrhage 03/28/2015   Peripheral neuropathy    Pneumonia    Rhabdomyolysis    S/P CABG x 2 03/29/2015   LIMA to Diagonal, SVG to OM, EVH via right thigh   Stroke ([place])    L patietal with small scattered  lacunar infarcts   Thrombocytopenia ([place]) 03/21/2015   Venous insufficiency    Weakness generalized 03/21/2015    Family History  Problem Relation Age of Onset   Heart disease Mother    Coronary artery disease Father    Cancer Maternal Grandmother    Heart Problems Maternal Grandfather    Diabetes Son        borderline     Social History   Socioeconomic History   Marital status: Married    Spouse name: Not on file   Number of children: 3   Years of education: 12   Highest education level: Not on file  Occupational History   Not on file  Tobacco Use   Smoking status: Former    Packs/day: 2.00    Years: 20.00     Pack years: 40.00    Types: Cigarettes    Quit date: 01/25/1974    Years since quitting: 47.7   Smokeless tobacco: Never  Vaping Use   Vaping Use: Never used  Substance and Sexual Activity   Alcohol use: No    Alcohol/week: 0.0 standard drinks   Drug use: No   Sexual activity: Not on file  Other Topics Concern   Not on file  Social History Narrative   Lives with wife and son   Right handed    Caffeine: maybe 1 cup/day   Social Determinants of Health   Financial Resource Strain: Not on file  Food Insecurity: Not on file  Transportation Needs: Not on file  Physical Activity: Not on file  Stress: Not on file  Social Connections: Not on file  Intimate Partner Violence: Not on file    Past Surgical History:  Procedure Laterality Date   BACK SURGERY  2002   lumbosacral. 11 back surgeries total   BRONCHIAL BIOPSY  08/06/2020   Procedure: BRONCHIAL BIOPSIES;  Surgeon: Collene Gobble, MD;  Location: [facility] ENDOSCOPY;  Service: Pulmonary;;   BRONCHIAL BRUSHINGS  08/06/2020   Procedure: BRONCHIAL BRUSHINGS;  Surgeon: Collene Gobble, MD;  Location: [facility] ENDOSCOPY;  Service: Pulmonary;;   BRONCHIAL NEEDLE ASPIRATION BIOPSY  08/06/2020   Procedure: BRONCHIAL NEEDLE ASPIRATION BIOPSIES;  Surgeon: Collene Gobble, MD;  Location: [facility] ENDOSCOPY;  Service: Pulmonary;;   CARDIOVERSION N/A 05/06/2021   Procedure: CARDIOVERSION;  Surgeon: Geralynn Rile, MD;  Location: [place];  Service: Cardiovascular;  Laterality: N/A;   Carotid Doppler  03/2013   bilat bulb/prox ICAs - mild amount of fibrous plaque with no evidence of diameter reduction   CARPAL TUNNEL RELEASE Bilateral 08/09/2014   Procedure: BILATERAL CARPAL TUNNEL RELEASE;  Surgeon: Daryll Brod, MD;  Location: [place];  Service: Orthopedics;  Laterality: Bilateral;  ANESTHESIA:  IV REGIONAL BIL FAB   CHOLECYSTECTOMY     COLONOSCOPY     CORONARY ANGIOPLASTY  10/13/1996   CORONARY ANGIOPLASTY  09/21/1989    emergency PTCA   CORONARY ANGIOPLASTY  10/13/1996   Multi-Link diagonal & OD stenting (Dr. Marella Chimes)   [place]  12/03/1997   disease of mid DX-1 ~50% & in mid PLA & PDA (distal lesions) (Dr. Marella Chimes)    CORONARY ANGIOPLASTY  10/14/1999   progression of disease distal PLA & PDA; progression of disease prox RCA - moderate (Dr. Marella Chimes)    CORONARY ANGIOPLASTY WITH STENT PLACEMENT  04/04/2004   4.0x76mm non-DES (thrombectomy via AngioJet) to RCA for high grade stenosis (Dr. Marella Chimes)   CORONARY ARTERY BYPASS GRAFT N/A 03/29/2015   Procedure: CORONARY  ARTERY BYPASS GRAFTING TIMES TWO USING LEFT INTERNAL MAMMARY ARTERY AND RIGHT LEG GREATER SAPHENOUS VEIN HARVESTED ENDOSCOPICALLY.;  Surgeon: Rexene Alberts, MD;  Location: [place];  Service: Open Heart Surgery;  Laterality: N/A;   ESOPHAGOGASTRODUODENOSCOPY N/A 03/27/2015   Procedure: ESOPHAGOGASTRODUODENOSCOPY (EGD);  Surgeon: Clarene Essex, MD;  Location[facility] ENDOSCOPY;  Service: Endoscopy;  Laterality: N/A;  possible dilation   ESOPHAGOGASTRODUODENOSCOPY N/A 10/05/2020   Procedure: ESOPHAGOGASTRODUODENOSCOPY (EGD);  Surgeon: Arta Silence, MD;  Location: [facility] END[facility];  Service: Endoscopy;  Laterality: N/A;   EYE SURGERY Right    macular fold-02/2021   FINE NEEDLE ASPIRATION  08/06/2020   Procedure: FINE NEEDLE ASPIRATION (FNA) LINEAR;  Surgeon: Collene Gobble, MD;  Location: [place] ENDOSCOPY;  Service: Pulmonary;;   IR IMAGING GUIDED PORT INSERTION  10/07/2020   LEFT HEART CATHETERIZATION WITH CORONARY ANGIOGRAM N/A 03/28/2015   Procedure: LEFT HEART CATHETERIZATION WITH CORONARY ANGIOGRAM;  Surgeon: Jaedyn Lard M Martinique, MD;  Location: [facility] CATH LAB;  Service: Cardiovascular;  Laterality: N/A;   SINUS ENDO W/FUSION     TEE WITHOUT CARDIOVERSION N/A 05/06/2021   Procedure: TRANSESOPHAGEAL ECHOCARDIOGRAM (TEE);  Surgeon: Geralynn Rile, MD;  Location: [place];  Service: Cardiovascular;  Laterality: N/A;    TONSILLECTOMY     TRANSTHORACIC ECHOCARDIOGRAM  08/08/2013   EF 55-60%, mild conc hypertrophy, grade 1 diastolic dysfunction; AV with mild stenosis; LA & RA mildly dilated   VIDEO BRONCHOSCOPY WITH ENDOBRONCHIAL NAVIGATION N/A 08/06/2020   Procedure: VIDEO BRONCHOSCOPY WITH ENDOBRONCHIAL NAVIGATION;  Surgeon: Collene Gobble, MD;  Location: [place] ENDOSCOPY;  Service: Pulmonary;  Laterality: N/A;   VIDEO BRONCHOSCOPY WITH ENDOBRONCHIAL ULTRASOUND N/A 08/06/2020   Procedure: VIDEO BRONCHOSCOPY WITH ENDOBRONCHIAL ULTRASOUND;  Surgeon: Collene Gobble, MD;  Location: [place] ENDOSCOPY;  Service: Pulmonary;  Laterality: N/A;      Current Facility-Administered Medications:    acetaminophen (TYLENOL) tablet 650 mg, 650 mg, Oral, Q6H PRN **OR** acetaminophen (TYLENOL) suppository 650 mg, 650 mg, Rectal, Q6H PRN, Kristopher Oppenheim, DO   albuterol (PROVENTIL) (2.5 MG/3ML) 0.083% nebulizer solution 2.5 mg, 2.5 mg, Nebulization, Q2H PRN, Kristopher Oppenheim, DO   amiodarone (PACERONE) tablet 200 mg, 200 mg, Oral, BID, Kristopher Oppenheim, DO, 200 mg at 09/27/21 1052   apixaban (ELIQUIS) tablet 2.5 mg, 2.5 mg, Oral, BID, Kristopher Oppenheim, DO, 2.5 mg at 09/27/21 1052   cefTRIAXone (ROCEPHIN) 1 g in sodium chloride 0.9 % 100 mL IVPB, 1 g, Intravenous, Q24H, Gonfa, Taye T, MD, Last Rate: 200 mL/hr at 09/27/21 1052, 1 g at 09/27/21 1052   furosemide (LASIX) injection 80 mg, 80 mg, Intravenous, Q12H, Kristopher Oppenheim, DO, 80 mg at 09/27/21 0743   insulin aspart (novoLOG) injection 0-15 Units, 0-15 Units, Subcutaneous, TID WC, Kristopher Oppenheim, DO, 2 Units at 09/27/21 2878   insulin aspart (novoLOG) injection 0-5 Units, 0-5 Units, Subcutaneous, QHS, Kristopher Oppenheim, DO   levothyroxine (SYNTHROID) tablet 88 mcg, 88 mcg, Oral, q morning, Kristopher Oppenheim, DO, 88 mcg at 09/27/21 0559   rosuvastatin (CRESTOR) tablet 20 mg, 20 mg, Oral, Daily, Kristopher Oppenheim, DO, 20 mg at 09/27/21 1052   sodium chloride flush (NS) 0.9 % injection 10 mL, 10 mL, Intravenous, Q12H, Gonfa, Taye T, MD, 10  mL at 09/27/21 1053   sodium chloride flush (NS) 0.9 % injection 10 mL, 10 mL, Intravenous, PRN, Cyndia Skeeters, Taye T, MD, 10 mL at 09/27/21 0746   sotorasib TABS 960 mg, 960 mg, Oral, Daily, Kristopher Oppenheim, DO, 960 mg at 09/27/21 1100   tamsulosin (FLOMAX) capsule 0.4 mg,  0.4 mg, Oral, QHS, Kristopher Oppenheim, DO  amiodarone  200 mg Oral BID   apixaban  2.5 mg Oral BID   furosemide  80 mg Intravenous Q12H   insulin aspart  0-15 Units Subcutaneous TID WC   insulin aspart  0-5 Units Subcutaneous QHS   levothyroxine  88 mcg Oral q morning   rosuvastatin  20 mg Oral Daily   sodium chloride flush  10 mL Intravenous Q12H   sotorasib  960 mg Oral Daily   tamsulosin  0.4 mg Oral QHS    cefTRIAXone (ROCEPHIN)  IV 1 g (09/27/21 1052)    Physical Exam: Blood pressure (!) 119/54, pulse 65, temperature (!) 97.4 F (36.3 C), temperature source Oral, resp. rate (!) 22, height 5\' 7"  (1.702 m), weight 97.5 kg, SpO2 95 %.    Elderly male Decreased BS apices Port tunneled right sublavian Moderate AS murmur noted Plus 2 LE edema bilateral with chronic stasis changes Foley catheter in place   Labs:   Lab Results  Component Value Date   WBC 6.9 09/27/2021   HGB 8.8 (L) 09/27/2021   HCT 28.8 (L) 09/27/2021   MCV 95.0 09/27/2021   PLT 96 (L) 09/27/2021    Recent Labs  Lab 09/27/21 0407  NA 137  K 4.3  CL 105  CO2 24  BUN 38*  CREATININE 2.27*  CALCIUM 8.4*  PROT 5.6*  BILITOT 0.7  ALKPHOS 89  ALT 8  AST 15  GLUCOSE 169*   Lab Results  Component Value Date   TROPONINI 0.08 (H) 04/05/2015    Lab Results  Component Value Date   CHOL 94 01/06/2021   CHOL 98 05/23/2019   CHOL 96 02/15/2018   Lab Results  Component Value Date   HDL 36.20 (L) 01/06/2021   HDL 38.40 (L) 05/23/2019   HDL 37.60 (L) 02/15/2018   Lab Results  Component Value Date   LDLCALC 36 01/06/2021   LDLCALC 43 05/23/2019   LDLCALC 40 02/15/2018   Lab Results  Component Value Date   TRIG 109.0 01/06/2021   TRIG 83.0  05/23/2019   TRIG 93.0 02/15/2018   Lab Results  Component Value Date   CHOLHDL 3 01/06/2021   CHOLHDL 3 05/23/2019   CHOLHDL 3 02/15/2018   No results found for: LDLDIRECT    Radiology: CT Abdomen Pelvis Wo Contrast  Result Date: 09/10/2021 CLINICAL DATA:  Primary Cancer Type: Lung Imaging Indication: Assess response to therapy Interval therapy since last imaging? Yes Initial Cancer Diagnosis Date: 08/06/2020; Established by: Biopsy-proven Detailed Pathology: Stage IV non-small cell lung cancer, mucinous adenocarcinoma. Primary Tumor location: Left upper lobe, right lower lobe. Surgeries: CABG 2016.  Cholecystectomy.  Multiple back surgeries. Chemotherapy: Yes; Ongoing?  No; Most recent administration: 01/2021 Immunotherapy?  Yes; Type: Keytruda; Ongoing? No Radiation therapy? No Other Cancer Therapies: Lumakras daily. EXAM: CT CHEST, ABDOMEN AND PELVIS WITHOUT CONTRAST TECHNIQUE: Multidetector CT imaging of the chest, abdomen and pelvis was performed following the standard protocol without IV contrast. COMPARISON:  Most recent CT chest, abdomen and pelvis 06/17/2021. 08/22/2020 PET-CT. FINDINGS: CT CHEST FINDINGS Cardiovascular: Right IJ Port-A-Cath terminates in the high right atrium. Atherosclerotic calcification of the aorta and aortic valve. Heart is enlarged. No pericardial effusion. Mediastinum/Nodes: 1.4 cm low-attenuation right thyroid nodule. No follow-up recommended. (Ref: J Am Coll Radiol. 2015 Feb;12(2): 143-50).No pathologically enlarged mediastinal or axillary lymph nodes. Hilar regions are difficult to evaluate without IV contrast. Esophagus is grossly unremarkable. Lungs/Pleura: Post treatment parenchymal retraction, bronchiectasis and volume  loss in the posteromedial mid and lower right hemithorax, as before. Small associated partially loculated right pleural effusion. Slight improvement in left upper lobe consolidation with associated architectural distortion. New  peribronchovascular ground-glass in the left lower lobe (4/91), likely infectious or inflammatory in etiology. Subpleural reticular densities and ground-glass in the peripheral left lower lobe, similar. Trace left pleural fluid. Airway is unremarkable. Musculoskeletal: Degenerative changes in the spine. No worrisome lytic or sclerotic lesions. CT ABDOMEN PELVIS FINDINGS Hepatobiliary: Liver is unremarkable. Cholecystectomy. No biliary ductal dilatation. Pancreas: Negative. Spleen: Negative. Adrenals/Urinary Tract: Adrenal glands are unremarkable. 4.6 cm fluid density lesion off the lower pole right kidney is likely a cyst. Other smaller low-attenuation lesions in the kidneys are too small to characterize. There is a subtle 1.8 cm lesion the posterior left kidney (2/73), as on prior exams. No urinary stones. Ureters are decompressed. Bladder is grossly unremarkable. Stomach/Bowel: Stomach is unremarkable. Duodenal diverticula incidentally noted. Small bowel and colon unremarkable. Appendix is questionably visualized. Vascular/Lymphatic: Atherosclerotic calcification of the aorta. No pathologically enlarged lymph nodes. Reproductive: Prostate is visualized. Other: Slight haziness in the anterior left anatomic pelvis (2/100), nonspecific and without a definite source. Mesenteries and peritoneum are otherwise unremarkable. Musculoskeletal: 6.6 x 9.9 cm partially calcified soft tissue mass posterior to the left sacroiliac joint and left sacrum, unchanged and possibly due to prior trauma. Degenerative changes in the spine. No worrisome lytic or sclerotic lesions. IMPRESSION: 1. Post treatment changes in the hemithoraces bilaterally. No evidence of recurrent or metastatic disease. 2. Subtle 1.8 cm lesion off the posterior interpolar left kidney, as on prior exams. Renal cell carcinoma can not be excluded. 3. Small right pleural effusion. 4.  Aortic atherosclerosis (ICD10-I70.0). Electronically Signed   By: Lorin Picket  M.D.   On: 09/10/2021 10:50   CT Chest Wo Contrast  Result Date: 09/10/2021 CLINICAL DATA:  Primary Cancer Type: Lung Imaging Indication: Assess response to therapy Interval therapy since last imaging? Yes Initial Cancer Diagnosis Date: 08/06/2020; Established by: Biopsy-proven Detailed Pathology: Stage IV non-small cell lung cancer, mucinous adenocarcinoma. Primary Tumor location: Left upper lobe, right lower lobe. Surgeries: CABG 2016.  Cholecystectomy.  Multiple back surgeries. Chemotherapy: Yes; Ongoing?  No; Most recent administration: 01/2021 Immunotherapy?  Yes; Type: Keytruda; Ongoing? No Radiation therapy? No Other Cancer Therapies: Lumakras daily. EXAM: CT CHEST, ABDOMEN AND PELVIS WITHOUT CONTRAST TECHNIQUE: Multidetector CT imaging of the chest, abdomen and pelvis was performed following the standard protocol without IV contrast. COMPARISON:  Most recent CT chest, abdomen and pelvis 06/17/2021. 08/22/2020 PET-CT. FINDINGS: CT CHEST FINDINGS Cardiovascular: Right IJ Port-A-Cath terminates in the high right atrium. Atherosclerotic calcification of the aorta and aortic valve. Heart is enlarged. No pericardial effusion. Mediastinum/Nodes: 1.4 cm low-attenuation right thyroid nodule. No follow-up recommended. (Ref: J Am Coll Radiol. 2015 Feb;12(2): 143-50).No pathologically enlarged mediastinal or axillary lymph nodes. Hilar regions are difficult to evaluate without IV contrast. Esophagus is grossly unremarkable. Lungs/Pleura: Post treatment parenchymal retraction, bronchiectasis and volume loss in the posteromedial mid and lower right hemithorax, as before. Small associated partially loculated right pleural effusion. Slight improvement in left upper lobe consolidation with associated architectural distortion. New peribronchovascular ground-glass in the left lower lobe (4/91), likely infectious or inflammatory in etiology. Subpleural reticular densities and ground-glass in the peripheral left lower lobe,  similar. Trace left pleural fluid. Airway is unremarkable. Musculoskeletal: Degenerative changes in the spine. No worrisome lytic or sclerotic lesions. CT ABDOMEN PELVIS FINDINGS Hepatobiliary: Liver is unremarkable. Cholecystectomy. No biliary ductal dilatation. Pancreas: Negative. Spleen:  Negative. Adrenals/Urinary Tract: Adrenal glands are unremarkable. 4.6 cm fluid density lesion off the lower pole right kidney is likely a cyst. Other smaller low-attenuation lesions in the kidneys are too small to characterize. There is a subtle 1.8 cm lesion the posterior left kidney (2/73), as on prior exams. No urinary stones. Ureters are decompressed. Bladder is grossly unremarkable. Stomach/Bowel: Stomach is unremarkable. Duodenal diverticula incidentally noted. Small bowel and colon unremarkable. Appendix is questionably visualized. Vascular/Lymphatic: Atherosclerotic calcification of the aorta. No pathologically enlarged lymph nodes. Reproductive: Prostate is visualized. Other: Slight haziness in the anterior left anatomic pelvis (2/100), nonspecific and without a definite source. Mesenteries and peritoneum are otherwise unremarkable. Musculoskeletal: 6.6 x 9.9 cm partially calcified soft tissue mass posterior to the left sacroiliac joint and left sacrum, unchanged and possibly due to prior trauma. Degenerative changes in the spine. No worrisome lytic or sclerotic lesions. IMPRESSION: 1. Post treatment changes in the hemithoraces bilaterally. No evidence of recurrent or metastatic disease. 2. Subtle 1.8 cm lesion off the posterior interpolar left kidney, as on prior exams. Renal cell carcinoma can not be excluded. 3. Small right pleural effusion. 4.  Aortic atherosclerosis (ICD10-I70.0). Electronically Signed   By: Lorin Picket M.D.   On: 09/10/2021 10:50   DG Chest Port 1 View  Result Date: 09/26/2021 CLINICAL DATA:  Respiratory distress. EXAM: PORTABLE CHEST 1 VIEW COMPARISON:  May 01, 2021 FINDINGS: There is  stable right-sided venous Port-A-Cath positioning. Multiple sternal wires are seen. Mild, diffuse, chronic appearing increased interstitial lung markings are seen. Mild, stable areas of atelectasis are seen within the bilateral lung bases and mid left lung. Stable elevation of the right hemidiaphragm is seen. There is no evidence of a pleural effusion or pneumothorax. The cardiac silhouette is enlarged and unchanged in size. A radiopaque fusion plate and screws are seen overlying the cervical spine. IMPRESSION: Chronic appearing increased interstitial lung markings with mild, stable bibasilar and mid left lung atelectasis. Electronically Signed   By: Virgina Norfolk M.D.   On: 09/26/2021 23:11    EKG: Afib PVC nonspecific ST changes    ASSESSMENT AND PLAN:   PAF:  post multiple DCC;s most recently May 2022 with TEE. On Amiodarone since 09/04/21 with load. On appropriate low dose eliquis 2.5 mg bid with no missed doses Rate is fine and not clear that afib is contributing to his FTT and poor ambulation given stage 4 lung cancer with active Rx. Agree with holding norvasc. No need for AV nodal drug with SSS and bradycardia. Continue amiodarone 200 mg daily Plan on [facility] wendsday may try to move up to Tuesday if possible Check TSH/T4 on amiodarone  CAD:  post distant CABG and stenting native RCA EF has been normal no angina stable Limits choice of AAT to low dose Tikosyn or amiodarone AS:  moderate and can contribute to his fatigue and dyspnea Not a candidate for even TAVR with current functional capacity and stage 4 lung cancer Lung Cancer significant anemia Hct 28.8 stable by recent CT on oral Rx  CRF:  He is volume overloaded on exam with LE edema , dyspnea and elevated BNP 1183 Normal home dose lasix 80 mg daily now on 80 mg iv bid follow Cr closely stable in 2.2 range   Will need PT/OT overall prognosis appears guarded with stage 4 lung cancer multiple co morbidities poor functional status, renal failure  , anemia, and aortic stenosis that is not Rx May end up as palliative care in next 6 months  SignedJenkins Rouge 09/27/2021, 1:05 PM

## 2021-09-27 NOTE — Progress Notes (Signed)
PROGRESS NOTE  Randall Wilkins LSL:[id number] DOB: 03-04-1941   PCP: [name], MD  Patient is from: Home.  Lives with his wife.  Uses walker at baseline but lately has been using wheelchair.  DOA: 09/26/2021 LOS: 0  Chief complaints:  Shortness of breath    Brief Narrative / Interim history: 80 year old M with PMH of combined CHF, stage IV lung cancer, DM-1, chronic hypoxic RF on 2 L, A. fib/flutter on Eliquis, CAD, obesity, hard of hearing, recent "UTI" and ambulatory dysfunction presenting with progressive weakness and acute shortness of breath.  He is admitted with acute on chronic hypoxic RF due to CHF exacerbation.  He was hypoxic to 52% on RA requiring up to 6 L to recover.  BNP elevated to 1200.  CXR with pulmonary edema.  Troponin 76>> 83.  EKG rate controlled A. fib with occasional PVCs.  Started on IV Lasix.  Patient was to have cardioversion next week.   Subjective: Seen and examined earlier this morning.  No major events overnight of this morning.  He has diminished hearing.  Reports right-sided pain with movement.  He denies chest pain or pain with deep breathing.  Breathing has improved.  Denies GI or UTI symptoms.  Wife stated that he had urinary frequency when he was seen by PCP and started on Keflex.  Objective: Vitals:   09/27/21 0230 09/27/21 0440 09/27/21 0547 09/27/21 0810  BP: 116/75 (!) 111/58 (!) 117/54 (!) 120/48  Pulse: 65 61 (!) 56 (!) 59  Resp: (!) 27 20 (!) 22   Temp:  98.5 F (36.9 C) 98.1 F (36.7 C) 97.6 F (36.4 C)  TempSrc:  Oral Oral Oral  SpO2: 98% 94% 95% 96%  Weight:   97.5 kg   Height:   5\' 7"  (1.702 m)     Intake/Output Summary (Last 24 hours) at 09/27/2021 1142 Last data filed at 09/27/2021 1121 Gross per 24 hour  Intake 480 ml  Output 1100 ml  Net -620 ml   Filed Weights   09/27/21 0007 09/27/21 0547  Weight: 100.7 kg 97.5 kg    Examination:  GENERAL: No apparent distress.  Nontoxic. HEENT: MMM.  Vision grossly intact.   Diminished hearing. NECK: Supple.  Notable JVD. RESP: 95% on 6 L.  No IWOB.  Bibasilar crackles. CVS:  RRR. Heart sounds normal.  ABD/GI/GU: BS+. Abd soft, NTND.  MSK/EXT:  Moves extremities. No apparent deformity.  1+ pitting edema in BLE. SKIN: no apparent skin lesion or wound NEURO: Awake, alert and oriented appropriately.  No apparent focal neuro deficit other than chronic LLE weakness. PSYCH: Calm. Normal affect.   Procedures:  None  Microbiology summarized: [id number] and influenza PCR nonreactive. Blood culture pending. Urine culture pending. Assessment & Plan: Acute on chronic hypoxic respiratory failure-patient is on 2 L at baseline.  Requiring 6 L to maintain saturation in mid 90s.  Likely due to CHF exacerbation.  ABG on 6 L basically normal. -Manage CHF as below -Wean oxygen as able  Acute on chronic combined CHF/severe PAH: TTE in 04/2021 with LVEF of 45 to 50%, mod AS, RVSP of 79 mmHg and mild to moderate TVR.  He presents with generalized weakness and acute dyspnea.  Good compliance with his diuretics, fluid and sodium per his wife.  BNP 1200 (higher than baseline).  CXR with pulmonary edema.  Has prominent JVD, crackles and edema on exam.  Started on IV Lasix.  Output is too early to quantify.  Soft blood pressures this morning. -  Consulted cardiology -Would be cautious with diuretics given his severe PAH -Hold Imdur and lisinopril given soft blood pressure -Monitor fluid status, renal functions and electrolytes. -Sodium and fluid restriction   Elevated troponin/history of CABG in 2016: Likely demand ischemia due to CHF and hypoxia.  No significant delta.  No chest pain.  EKG without ischemic finding. -Manage CHF as above -Continue home meds   IDDM-1 with neuropathy and hyperglycemia: A1c 6.6% on 09/09/2021. Recent Labs  Lab 09/27/21 0600 09/27/21 1118  GLUCAP 138* 168*  -Continue current insulin regimen  AKI on CKD-3B/azotemia-cardiorenal? Recent Labs     05/06/21 0349 05/07/21 0551 05/20/21 0947 06/11/21 0937 06/17/21 1254 07/22/21 0930 08/20/21 0922 09/08/21 0935 09/27/21 0020 09/27/21 0407  BUN 21 23 28* 43* 41* 25* 21 26* 39* 38*  CREATININE 1.60* 1.57* 1.46* 1.89* 1.73* 1.67* 1.63* 1.83* 2.22* 2.27*  -Continue monitoring -Further work-up if worse or no improvement   Persistent A. fib/flutter-rate controlled.  On amiodarone and Eliquis.  Reports good compliance with Eliquis.  Plan was for possible cardioversion next week but he got admitted -Continue amiodarone and Eliquis -Cardiology consulted  Possible UTI: Reportedly had increased frequency when he presented to PCP.  He was started on high-dose Keflex for his renal function.  UA negative -IV ceftriaxone for 3 days -Follow urine culture  Anemia of chronic disease-Baseline Hgb 9-10.  Relatively stable Recent Labs    05/05/21 1214 05/06/21 0349 05/20/21 0947 06/17/21 1254 07/22/21 0930 08/20/21 0922 09/08/21 0935 09/26/21 2354 09/27/21 0020 09/27/21 0407  HGB 11.3* 9.9* 10.5* 10.1* 9.7* 9.6* 9.5* 11.6* 8.7* 8.8*  -Continue monitoring  Generalized weakness/physical deconditioning/chronic LLE weakness-uses walker at baseline.  Lately using wheelchair -PT/OT consult  Essential hypertension: Soft blood pressure -IV Lasix as above -Hold Imdur, amlodipine and lisinopril given soft blood pressures   Adenocarcinoma of right lung, stage 4  -Continue oral Lumakras. Told wife to bring this med to hospital.  BPH without LUTS -Continue Flomax  Hypothyroidism: -Continue home Synthroid  Goal of care-patient with significant comorbidities as above.  Poor long-term prognosis.  Not sure if CPR and intubation are to his best interest.  -Consult palliative care   Body mass index is 33.67 kg/m.         DVT prophylaxis:  apixaban (ELIQUIS) tablet 2.5 mg Start: 09/27/21 1000 SCDs Start: 09/27/21 0317 apixaban (ELIQUIS) tablet 2.5 mg  Code Status: Full code Family  Communication: Updated patient's wife at bedside. Level of care: Telemetry Medical Status is: Inpatient  Remains inpatient appropriate because: Due to significantly increased oxygen requirement, fluid overload, AKI and hypotension   Consultants:  Cardiology Palliative medicine   Sch Meds:  Scheduled Meds:  amiodarone  200 mg Oral BID   apixaban  2.5 mg Oral BID   furosemide  80 mg Intravenous Q12H   insulin aspart  0-15 Units Subcutaneous TID WC   insulin aspart  0-5 Units Subcutaneous QHS   levothyroxine  88 mcg Oral q morning   rosuvastatin  20 mg Oral Daily   sodium chloride flush  10 mL Intravenous Q12H   sotorasib  960 mg Oral Daily   tamsulosin  0.4 mg Oral QHS   Continuous Infusions:  cefTRIAXone (ROCEPHIN)  IV 1 g (09/27/21 1052)   PRN Meds:.acetaminophen **OR** acetaminophen, albuterol, sodium chloride flush  Antimicrobials: Anti-infectives (From admission, onward)    Start     Dose/Rate Route Frequency Ordered Stop   09/27/21 0930  cefTRIAXone (ROCEPHIN) 1 g in sodium chloride 0.9 % 100  mL IVPB        1 g 200 mL/hr over 30 Minutes Intravenous Every 24 hours 09/27/21 0839 09/30/21 0929        I have personally reviewed the following labs and images: CBC: Recent Labs  Lab 09/26/21 2354 09/27/21 0020 09/27/21 0407  WBC  --  8.1 6.9  NEUTROABS  --  6.3 4.7  HGB 11.6* 8.7* 8.8*  HCT 34.0* 29.4* 28.8*  MCV  --  96.4 95.0  PLT  --  121* 96*   BMP &GFR Recent Labs  Lab 09/26/21 2354 09/27/21 0020 09/27/21 0407  NA 139 137 137  K 4.0 4.2 4.3  CL  --  103 105  CO2  --  23 24  GLUCOSE  --  145* 169*  BUN  --  39* 38*  CREATININE  --  2.22* 2.27*  CALCIUM  --  8.4* 8.4*  MG  --   --  2.2   Estimated Creatinine Clearance: 28.9 mL/min (A) (by C-G formula based on SCr of 2.27 mg/dL (H)). Liver & Pancreas: Recent Labs  Lab 09/27/21 0020 09/27/21 0407  AST 17 15  ALT 11 8  ALKPHOS 92 89  BILITOT 0.9 0.7  PROT 5.8* 5.6*  ALBUMIN 2.8* 2.6*    No results for input(s): LIPASE, AMYLASE in the last 168 hours. No results for input(s): AMMONIA in the last 168 hours. Diabetic: No results for input(s): HGBA1C in the last 72 hours. Recent Labs  Lab 09/27/21 0600 09/27/21 1118  GLUCAP 138* 168*   Cardiac Enzymes: No results for input(s): CKTOTAL, CKMB, CKMBINDEX, TROPONINI in the last 168 hours. No results for input(s): PROBNP in the last 8760 hours. Coagulation Profile: Recent Labs  Lab 09/27/21 0020  INR 1.8*   Thyroid Function Tests: No results for input(s): TSH, T4TOTAL, FREET4, T3FREE, THYROIDAB in the last 72 hours. Lipid Profile: No results for input(s): CHOL, HDL, LDLCALC, TRIG, CHOLHDL, LDLDIRECT in the last 72 hours. Anemia Panel: No results for input(s): VITAMINB12, FOLATE, FERRITIN, TIBC, IRON, RETICCTPCT in the last 72 hours. Urine analysis:    Component Value Date/Time   COLORURINE YELLOW 09/27/2021 0021   APPEARANCEUR CLEAR 09/27/2021 0021   LABSPEC 1.011 09/27/2021 0021   PHURINE 5.0 09/27/2021 0021   GLUCOSEU NEGATIVE 09/27/2021 0021   GLUCOSEU NEGATIVE 08/29/2019 0959   HGBUR NEGATIVE 09/27/2021 0021   BILIRUBINUR NEGATIVE 09/27/2021 0021   KETONESUR NEGATIVE 09/27/2021 0021   PROTEINUR 30 (A) 09/27/2021 0021   UROBILINOGEN 0.2 08/29/2019 0959   NITRITE NEGATIVE 09/27/2021 0021   LEUKOCYTESUR NEGATIVE 09/27/2021 0021   Sepsis Labs: Invalid input(s): PROCALCITONIN, [place]  Microbiology: Recent Results (from the past 240 hour(s))  Resp Panel by RT-PCR (Flu A&B, Covid) Peripheral     Status: None   Collection Time: 09/27/21 12:25 AM   Specimen: Peripheral; Nasopharyngeal(NP) swabs in vial transport medium  Result Value Ref Range Status   SARS Coronavirus 2 by RT PCR NEGATIVE NEGATIVE Final    Comment: (NOTE) SARS-CoV-2 target nucleic acids are NOT DETECTED.  The SARS-CoV-2 RNA is generally detectable in upper respiratory specimens during the acute phase of infection. The  lowest concentration of SARS-CoV-2 viral copies this assay can detect is 138 copies/mL. A negative result does not preclude SARS-Cov-2 infection and should not be used as the sole basis for treatment or other patient management decisions. A negative result may occur with  improper specimen collection/handling, submission of specimen other than nasopharyngeal swab, presence of viral mutation(s) within the areas  targeted by this assay, and inadequate number of viral copies(<138 copies/mL). A negative result must be combined with clinical observations, patient history, and epidemiological information. The expected result is Negative.  Fact Sheet for Patients:  EntrepreneurPulse.com.au  Fact Sheet for Healthcare Providers:  IncredibleEmployment.be  This test is no t yet approved or cleared by the [place] FDA and  has been authorized for detection and/or diagnosis of SARS-CoV-2 by FDA under an Emergency Use Authorization (EUA). This EUA will remain  in effect (meaning this test can be used) for the duration of the COVID-19 declaration under Section 564(b)(1) of the Act, 21 U.S.C.section 360bbb-3(b)(1), unless the authorization is terminated  or revoked sooner.       Influenza A by PCR NEGATIVE NEGATIVE Final   Influenza B by PCR NEGATIVE NEGATIVE Final    Comment: (NOTE) The Xpert Xpress SARS-CoV-2/FLU/RSV plus assay is intended as an aid in the diagnosis of influenza from Nasopharyngeal swab specimens and should not be used as a sole basis for treatment. Nasal washings and aspirates are unacceptable for Xpert Xpress SARS-CoV-2/FLU/RSV testing.  Fact Sheet for Patients: EntrepreneurPulse.com.au  Fact Sheet for Healthcare Providers: IncredibleEmployment.be  This test is not yet approved or cleared by the [place] FDA and has been authorized for detection and/or diagnosis of SARS-CoV-2 by FDA under  an Emergency Use Authorization (EUA). This EUA will remain in effect (meaning this test can be used) for the duration of the COVID-19 declaration under Section 564(b)(1) of the Act, 21 U.S.C. section 360bbb-3(b)(1), unless the authorization is terminated or revoked.  Performed at [facility], [place] [address] [place], [place] [address]     Radiology Studies: DG Chest Port 1 View  Result Date: 09/26/2021 CLINICAL DATA:  Respiratory distress. EXAM: PORTABLE CHEST 1 VIEW COMPARISON:  May 01, 2021 FINDINGS: There is stable right-sided venous Port-A-Cath positioning. Multiple sternal wires are seen. Mild, diffuse, chronic appearing increased interstitial lung markings are seen. Mild, stable areas of atelectasis are seen within the bilateral lung bases and mid left lung. Stable elevation of the right hemidiaphragm is seen. There is no evidence of a pleural effusion or pneumothorax. The cardiac silhouette is enlarged and unchanged in size. A radiopaque fusion plate and screws are seen overlying the cervical spine. IMPRESSION: Chronic appearing increased interstitial lung markings with mild, stable bibasilar and mid left lung atelectasis. Electronically Signed   By: Virgina Norfolk M.D.   On: 09/26/2021 23:11    Additional 45 minutes with more than 50% spent in reviewing records, counseling patient/family, coordinating care and discussing with consultants   Ceclia Koker T. Haakon  If 7PM-7AM, please contact night-coverage [email or web address] 09/27/2021, 11:42 AM

## 2021-09-27 NOTE — ED Provider Notes (Signed)
[facility] EMERGENCY DEPARTMENT Provider Note   CSN: [id number] Arrival date & time: 09/26/21  2247     History No chief complaint on file.   Randall Wilkins is a 80 y.o. male presents to the emergency department with a history of congestive heart failure, persistent a flutter, adenocarcinoma of the right lung, stage IV, NSTEMI, GI bleed, insulin-dependent diabetes.  Per EMS, they were called for severe respiratory distress.  They found patient alert with oxygen saturation of 52% on 3 L via nasal cannula.  Severe wheezing, tachypnea and retractions.  Patient given supplemental oxygen and nebulizer with significant improvement.  On arrival, patient removed from oxygen supplementation and begins oxygen saturations of 53% on room air.  88% on 6 L via nasal cannula.  Reports increasing shortness of breath of the last week or so, worst with exertion.  Uses albuterol at home but states tonight's episode became severe too quickly.  No other aggravating or alleviating factors.  Patient denies fevers or chills.  Does report he is currently being treated for prostate infection but has not had any complications from this.  The history is provided by the patient and medical records. No language interpreter was used.      Past Medical History:  Diagnosis Date   Aortic stenosis, mild 11/14/2013   Aortic valve sclerosis 03/29/2015   Bilateral leg edema 05/21/2014   CAD (coronary artery disease)    Chronic diastolic congestive heart failure (HCC)    Chronic kidney disease (CKD), stage III (moderate) (HCC)    Diabetes 1.5, managed as type 1 ([place]) 02/04/2013   Diabetic peripheral neuropathy associated with type 1 diabetes mellitus ([place]) 11/14/2013   Dysphagia    Dyspnea on exertion 03/21/2015   Exogenous obesity    Gout    Heart attack ([place])    History of nuclear stress test 07/2011   dipyridamole; fixed inferolateral defect, worse at stress than rest; no reversible ischemia; low risk  scan    Hyperlipidemia    Hypertension    Hypothyroidism    Insulin dependent diabetes mellitus    Left foot drop    Left main coronary artery disease 03/28/2015   lung ca dx'd 08/2020   Macular pucker, right eye 01/29/2021   Vitrectomy membrane peel right eye 02-12-2021   Memory loss    Obesity (BMI 30-39.9) 11/14/2013   Obstructive sleep apnea 03/21/2015   OSA on CPAP    uses a cpap   Peptic ulcer with hemorrhage 03/28/2015   Peripheral neuropathy    Pneumonia    Rhabdomyolysis    S/P CABG x 2 03/29/2015   LIMA to Diagonal, SVG to OM, EVH via right thigh   Stroke ([place])    L patietal with small scattered lacunar infarcts   Thrombocytopenia ([place]) 03/21/2015   Venous insufficiency    Weakness generalized 03/21/2015    Patient Active Problem List   Diagnosis Date Noted   Atrial fibrillation and flutter ([place]) 09/27/2021   Acute respiratory failure with hypoxia ([place]) 09/27/2021   Typical atrial flutter ([place]) 05/04/2021   CHF (congestive heart failure) ([place]) 05/02/2021   Acute on chronic combined systolic and diastolic CHF (congestive heart failure) ([place])    Cystoid macular degeneration of right eye 04/02/2021   Symptomatic bradycardia 02/26/2021   Port-A-Cath in place 02/03/2021   Macular pucker, left eye 01/29/2021   Memory loss 12/23/2020   Cerebrovascular accident (CVA) ([place]) 12/23/2020   ARF (acute renal failure) ([place]) 10/02/2020   Adenocarcinoma of  right lung, stage 4 ([place]) 09/02/2020   Encounter for antineoplastic immunotherapy 09/02/2020   Goals of care, counseling/discussion 08/20/2020   Encounter for antineoplastic chemotherapy 08/20/2020   Mediastinal lymphadenopathy 08/06/2020   Lung mass 06/05/2020   Essential hypertension 03/23/2018   Mild cognitive impairment 12/24/2017   Gait abnormality 12/24/2017   Murmur, cardiac 03/04/2017   History of stroke 12/12/2015   Gait disorder 12/12/2015   Coronary artery disease involving native coronary artery of native heart without  angina pectoris    S/P CABG x 2 03/29/2015   Aortic valve sclerosis 03/29/2015   Peptic ulcer with hemorrhage 03/28/2015   Left main coronary artery disease 03/28/2015   Chronic diastolic congestive heart failure (HCC)    Chronic kidney disease (CKD), stage III (moderate) (HCC)    Dysphagia    Chronic respiratory failure with hypoxia (HCC) - 2 L/min 03/25/2015   NSTEMI (non-ST elevated myocardial infarction) ([place]) 03/25/2015   Shortness of breath    Subendocardial MI subsequent episode care ([place]) 03/24/2015   SOB (shortness of breath)    Confusion 03/21/2015   Weakness generalized 03/21/2015   Increased urinary frequency 03/21/2015   Hyperglycemia 03/21/2015   Thrombocytopenia ([place]) 03/21/2015   Hematoma of abdominal wall 03/21/2015   Obstructive sleep apnea 48/88/9169   Mild diastolic dysfunction 45/02/8881   Dyspnea on exertion 03/21/2015   Neuropathy 09/11/2014   Bilateral leg edema 05/21/2014   Diabetic peripheral neuropathy associated with type 1 diabetes mellitus ([place]) 11/14/2013   Type 1 diabetes mellitus with nephropathy ([place]) 11/14/2013   Aortic valve stenosis 11/14/2013   Obesity (BMI 30-39.9) 11/14/2013   Asymptomatic PVCs 11/14/2013   Memory impairment 02/04/2013   Hyperlipidemia 02/04/2013   Diabetes 1.5, managed as type 1 ([place]) 02/04/2013   Cerebral infarction ([place]) 02/04/2013   CAD S/P percutaneous coronary angioplasty 11/14/2004    Past Surgical History:  Procedure Laterality Date   BACK SURGERY  2002   lumbosacral. 11 back surgeries total   BRONCHIAL BIOPSY  08/06/2020   Procedure: BRONCHIAL BIOPSIES;  Surgeon: Collene Gobble, MD;  Location: [facility] ENDOSCOPY;  Service: Pulmonary;;   BRONCHIAL BRUSHINGS  08/06/2020   Procedure: BRONCHIAL BRUSHINGS;  Surgeon: Collene Gobble, MD;  Location: [facility];  Service: Pulmonary;;   BRONCHIAL NEEDLE ASPIRATION BIOPSY  08/06/2020   Procedure: BRONCHIAL NEEDLE ASPIRATION BIOPSIES;  Surgeon: Collene Gobble, MD;   Location: [facility] ENDOSCOPY;  Service: Pulmonary;;   CARDIOVERSION N/A 05/06/2021   Procedure: CARDIOVERSION;  Surgeon: Geralynn Rile, MD;  Location: [facility];  Service: Cardiovascular;  Laterality: N/A;   Carotid Doppler  03/2013   bilat bulb/prox ICAs - mild amount of fibrous plaque with no evidence of diameter reduction   CARPAL TUNNEL RELEASE Bilateral 08/09/2014   Procedure: BILATERAL CARPAL TUNNEL RELEASE;  Surgeon: Daryll Brod, MD;  Location: [place];  Service: Orthopedics;  Laterality: Bilateral;  ANESTHESIA:  IV REGIONAL BIL FAB   CHOLECYSTECTOMY     COLONOSCOPY     CORONARY ANGIOPLASTY  10/13/1996   CORONARY ANGIOPLASTY  09/21/1989   emergency PTCA   CORONARY ANGIOPLASTY  10/13/1996   Multi-Link diagonal & OD stenting (Dr. Marella Chimes)   CORONARY ANGIOPLASTY  12/03/1997   disease of mid DX-1 ~50% & in mid PLA & PDA (distal lesions) (Dr. Marella Chimes)    CORONARY ANGIOPLASTY  10/14/1999   progression of disease distal PLA & PDA; progression of disease prox RCA - moderate (Dr. Marella Chimes)    [place]  04/04/2004   4.0x62m non-DES (thrombectomy via AngioJet) to RCA for high grade stenosis (Dr. RMarella Chimes   CORONARY ARTERY BYPASS GRAFT N/A 03/29/2015   Procedure: CORONARY ARTERY BYPASS GRAFTING TIMES TWO USING LEFT INTERNAL MAMMARY ARTERY AND RIGHT LEG GREATER SAPHENOUS VEIN HARVESTED ENDOSCOPICALLY.;  Surgeon: CRexene Alberts MD;  [place]  Service: Open Heart Surgery;  Laterality: N/A;   ESOPHAGOGASTRODUODENOSCOPY N/A 03/27/2015   Procedure: ESOPHAGOGASTRODUODENOSCOPY (EGD);  Surgeon: MClarene Essex MD;  [facility]  Service: Endoscopy;  Laterality: N/A;  possible dilation   ESOPHAGOGASTRODUODENOSCOPY N/A 10/05/2020   Procedure: ESOPHAGOGASTRODUODENOSCOPY (EGD);  Surgeon: OArta Silence MD;  [facility];  Service: Endoscopy;  Laterality: N/A;   EYE SURGERY Right    macular fold-02/2021    FINE NEEDLE ASPIRATION  08/06/2020   Procedure: FINE NEEDLE ASPIRATION (FNA) LINEAR;  Surgeon: BCollene Gobble MD;  [facility];  Service: Pulmonary;;   IR IMAGING GUIDED PORT INSERTION  10/07/2020   LEFT HEART CATHETERIZATION WITH CORONARY ANGIOGRAM N/A 03/28/2015   Procedure: LEFT HEART CATHETERIZATION WITH CORONARY ANGIOGRAM;  Surgeon: Peter M JMartinique MD;  [facility];  Service: Cardiovascular;  Laterality: N/A;   SINUS ENDO W/FUSION     TEE WITHOUT CARDIOVERSION N/A 05/06/2021   Procedure: TRANSESOPHAGEAL ECHOCARDIOGRAM (TEE);  Surgeon: OGeralynn Rile MD;  [place]  Service: Cardiovascular;  Laterality: N/A;   TONSILLECTOMY     TRANSTHORACIC ECHOCARDIOGRAM  08/08/2013   EF 55-60%, mild conc hypertrophy, grade 1 diastolic dysfunction; AV with mild stenosis; LA & RA mildly dilated   VIDEO BRONCHOSCOPY WITH ENDOBRONCHIAL NAVIGATION N/A 08/06/2020   Procedure: VIDEO BRONCHOSCOPY WITH ENDOBRONCHIAL NAVIGATION;  Surgeon: BCollene Gobble MD;  [facility];  Service: Pulmonary;  Laterality: N/A;   VIDEO BRONCHOSCOPY WITH ENDOBRONCHIAL ULTRASOUND N/A 08/06/2020   Procedure: VIDEO BRONCHOSCOPY WITH ENDOBRONCHIAL ULTRASOUND;  Surgeon: BCollene Gobble MD;  [facility];  Service: Pulmonary;  Laterality: N/A;       Family History  Problem Relation Age of Onset   Heart disease Mother    Coronary artery disease Father    Cancer Maternal Grandmother    Heart Problems Maternal Grandfather    Diabetes Son        borderline     Social History   Tobacco Use   Smoking status: Former    Packs/day: 2.00    Years: 20.00    Pack years: 40.00    Types: Cigarettes    Quit date: 01/25/1974    Years since quitting: 47.7   Smokeless tobacco: Never  Vaping Use   Vaping Use: Never used  Substance Use Topics   Alcohol use: No    Alcohol/week: 0.0 standard drinks   Drug use: No    Home Medications Prior to Admission medications    Medication Sig Start Date End Date Taking? Authorizing Provider  acetaminophen (TYLENOL) 500 MG tablet Take 1,000 mg by mouth every 6 (six) hours as needed for moderate pain or headache.   Yes [provider]  albuterol (VENTOLIN HFA) 108 (90 Base) MCG/ACT inhaler Inhale 2 puffs into the lungs every 6 (six) hours as needed for wheezing or shortness of breath.   Yes [provider]  allopurinol (ZYLOPRIM) 100 MG tablet TAKE ONE TABLET EVERY DAY Patient taking differently: Take 100 mg by mouth daily. 05/14/21  Yes Hilty, KNadean Corwin MD  amiodarone (PACERONE) 200 MG tablet Take 1 tablet (200 mg total) by mouth daily. Patient taking differently: Take 200  mg by mouth 2 (two) times daily. 09/02/21  Yes Camnitz, Will Hassell Done, MD  amitriptyline (ELAVIL) 50 MG tablet Take 50 mg by mouth at bedtime. 06/11/21  Yes [provider]  amLODipine (NORVASC) 10 MG tablet TAKE ONE TABLET BY MOUTH ONCE DAILY Patient taking differently: Take 10 mg by mouth daily. 05/13/21  Yes Hilty, Nadean Corwin, MD  apixaban (ELIQUIS) 5 MG TABS tablet Take 1 tablet (5 mg total) by mouth 2 (two) times daily. 03/11/21  Yes Hilty, Nadean Corwin, MD  Blood Glucose Monitoring Suppl (CONTOUR NEXT MONITOR) w/Device KIT 1 Device by Does not apply route 3 (three) times daily. Use to check blood sugars 3 times daily. Dx Code E13.9 07/29/17  Yes Elayne Snare, MD  cephALEXin (KEFLEX) 500 MG capsule Take 500 mg by mouth See admin instructions. Tid x 7 days 09/24/21  Yes [provider]  cetirizine (ZYRTEC) 10 MG tablet Take 10 mg by mouth daily.   Yes [provider]  colchicine 0.6 MG tablet Take 0.6 mg by mouth 3 (three) times daily as needed (gout flares).   Yes [provider]  CONTOUR NEXT TEST test strip USE 1 STRIP TO CHECK GLUCOSE 4 TIMES DAILY Patient taking differently: USE 1 STRIP TO CHECK GLUCOSE 3 TIMES DAILY 08/25/21  Yes Elayne Snare, MD  furosemide (LASIX) 40 MG tablet Take 2 tablets (80 mg  total) by mouth 2 (two) times daily. Patient taking differently: Take 80 mg by mouth in the morning and at bedtime. 8:30 am and 2 pm 07/01/21  Yes Hilty, Nadean Corwin, MD  hydrALAZINE (APRESOLINE) 25 MG tablet Take 37.5 mg by mouth in the morning and at bedtime.   Yes [provider]  insulin degludec (TRESIBA FLEXTOUCH) 200 UNIT/ML FlexTouch Pen Inject 12 Units into the skin at bedtime. Patient taking differently: Inject 6 Units into the skin at bedtime. 05/07/21  Yes Enzo Bi, MD  insulin lispro (HUMALOG) 100 UNIT/ML cartridge Inject 5-15 Units into the skin 3 (three) times daily with meals. Sliding scale.   Yes [provider]  Insulin Pen Needle (EASY COMFORT PEN NEEDLES) 33G X 4 MM MISC 1 each by Does not apply route See admin instructions. Use to inject insulin 5 times daily. 05/31/19  Yes Elayne Snare, MD  isosorbide dinitrate (ISORDIL) 20 MG tablet Take 1 tablet (20 mg total) by mouth in the morning and at bedtime. 05/08/21  Yes Hilty, Nadean Corwin, MD  levothyroxine (SYNTHROID) 88 MCG tablet Take 88 mcg by mouth every morning. 09/12/21  Yes [provider]  lidocaine-prilocaine (EMLA) cream APPLY A TEASPOON OVER PORT SITE AT [place] BEFORE LAB APPOINTMENT. DO NOT RUB IN AND COVER WITH PLASTIC WRAP Patient taking differently: Apply 1 application topically See admin instructions. Apply a teaspoon over port site at least one hour before lab appointment. Do not rub in and cover with plastic wrap. 04/08/21  Yes Curt Bears, MD  lisinopril (ZESTRIL) 10 MG tablet Take 1 tablet (10 mg total) by mouth daily. 05/26/21  Yes Elayne Snare, MD  LUMAKRAS 120 MG TABS Take 8 tablets (931m) by mouth daily at one time as directed by physician Patient taking differently: Take 960 mg by mouth daily. 09/10/21  Yes MCurt Bears MD  ondansetron (ZOFRAN) 4 MG tablet Take 1 tablet (4 mg total) by mouth every 6 (six) hours as needed for nausea. 10/08/20  Yes Sheikh, Omair Latif, DO  OXYGEN  Inhale 2 L into the lungs continuous.   Yes [provider]  potassium chloride SA (K-DUR,KLOR-CON) 20 MEQ tablet Take 20 mEq by mouth 2 (two) times daily.    Yes [provider]  prochlorperazine (COMPAZINE) 10 MG tablet Take 1 tablet (10 mg total) by mouth every 6 (six) hours as needed for nausea or vomiting. 09/02/20  Yes Curt Bears, MD  rosuvastatin (CRESTOR) 20 MG tablet Take 20 mg by mouth daily.   Yes [provider]  Tamsulosin HCl (FLOMAX) 0.4 MG CAPS Take 0.4 mg by mouth at bedtime.   Yes [provider]  vitamin B-12 (CYANOCOBALAMIN) 1000 MCG tablet Take 1,000 mcg by mouth at bedtime.   Yes [provider]    Allergies    Actos [pioglitazone], Spironolactone, Metformin and related, Niaspan [niacin er], and Wound dressing adhesive  Review of Systems   Review of Systems  Constitutional:  Positive for fatigue. Negative for appetite change, diaphoresis, fever and unexpected weight change.  HENT:  Negative for mouth sores.   Eyes:  Negative for visual disturbance.  Respiratory:  Positive for chest tightness and shortness of breath. Negative for cough and wheezing.   Cardiovascular:  Negative for chest pain.  Gastrointestinal:  Negative for abdominal pain, constipation, diarrhea, nausea and vomiting.  Endocrine: Negative for polydipsia, polyphagia and polyuria.  Genitourinary:  Negative for dysuria, frequency, hematuria and urgency.  Musculoskeletal:  Negative for back pain and neck stiffness.  Skin:  Negative for rash.  Allergic/Immunologic: Negative for immunocompromised state.  Neurological:  Negative for syncope, light-headedness and headaches.  Hematological:  Does not bruise/bleed easily.  Psychiatric/Behavioral:  Negative for sleep disturbance. The patient is not nervous/anxious.    Physical Exam Updated Vital Signs BP (!) 122/51   Pulse 81   Temp 98.7 F (37.1 C) (Oral)   Resp (!) 27   Ht '5\' 7"'  (1.702 m)   Wt 100.7 kg    SpO2 99%   BMI 34.77 kg/m   Physical Exam Vitals and nursing note reviewed.  Constitutional:      General: He is not in acute distress.    Appearance: He is not diaphoretic.  HENT:     Head: Normocephalic.  Eyes:     General: No scleral icterus.    Conjunctiva/sclera: Conjunctivae normal.  Cardiovascular:     Rate and Rhythm: Normal rate and regular rhythm.     Pulses: Normal pulses.          Radial pulses are 2+ on the right side and 2+ on the left side.     Comments: Severe BLE pitting edema Pulmonary:     Effort: Tachypnea, accessory muscle usage, prolonged expiration, respiratory distress and retractions present.     Breath sounds: No stridor. Wheezing (throughout) present. No rales.     Comments: Equal chest rise. Abdominal:     General: There is no distension.     Palpations: Abdomen is soft.     Tenderness: There is no abdominal tenderness. There is no guarding or rebound.  Musculoskeletal:     Cervical back: Normal range of motion.     Right lower leg: 4+ Edema present.     Left lower leg: 4+ Edema present.     Comments: Moves all extremities equally and without difficulty.  Skin:    General: Skin is warm and dry.     Capillary Refill: Capillary refill takes less than 2 seconds.  Neurological:     Mental Status: He is alert.     GCS: GCS eye subscore is 4. GCS verbal subscore is 5. GCS  motor subscore is 6.     Comments: Speech is clear and goal oriented.  Psychiatric:        Mood and Affect: Mood normal.    ED Results / Procedures / Treatments   Labs (all labs ordered are listed, but only abnormal results are displayed) Labs Reviewed  COMPREHENSIVE METABOLIC PANEL - Abnormal; Notable for the following components:      Result Value   Glucose, Bld 145 (*)    BUN 39 (*)    Creatinine, Ser 2.22 (*)    Calcium 8.4 (*)    Total Protein 5.8 (*)    Albumin 2.8 (*)    GFR, Estimated 29 (*)    All other components within normal limits  CBC WITH  DIFFERENTIAL/PLATELET - Abnormal; Notable for the following components:   RBC 3.05 (*)    Hemoglobin 8.7 (*)    HCT 29.4 (*)    MCHC 29.6 (*)    RDW 17.5 (*)    Platelets 121 (*)    All other components within normal limits  PROTIME-INR - Abnormal; Notable for the following components:   Prothrombin Time 20.4 (*)    INR 1.8 (*)    All other components within normal limits  APTT - Abnormal; Notable for the following components:   aPTT 48 (*)    All other components within normal limits  BRAIN NATRIURETIC PEPTIDE - Abnormal; Notable for the following components:   B Natriuretic Peptide 1,183.2 (*)    All other components within normal limits  I-STAT ARTERIAL BLOOD GAS, ED - Abnormal; Notable for the following components:   HCT 34.0 (*)    Hemoglobin 11.6 (*)    All other components within normal limits  TROPONIN I (HIGH SENSITIVITY) - Abnormal; Notable for the following components:   Troponin I (High Sensitivity) 76 (*)    All other components within normal limits  RESP PANEL BY RT-PCR (FLU A&B, COVID) ARPGX2  URINE CULTURE  EXPECTORATED SPUTUM ASSESSMENT W GRAM STAIN, RFLX TO RESP C  CULTURE, BLOOD (ROUTINE X 2)  CULTURE, BLOOD (ROUTINE X 2)  LACTIC ACID, PLASMA  LACTIC ACID, PLASMA  URINALYSIS, ROUTINE W REFLEX MICROSCOPIC  BLOOD GAS, ARTERIAL  TROPONIN I (HIGH SENSITIVITY)    EKG EKG Interpretation  Date/Time:  Saturday September 27 2021 00:30:18 EDT Ventricular Rate:  78 PR Interval:    QRS Duration: 107 QT Interval:  442 QTC Calculation: 504 R Axis:   -42 Text Interpretation: Atrial fibrillation Ventricular premature complex Prolonged QT interval Confirmed by Dory Horn) on 09/27/2021 2:04:16 AM   Radiology DG Chest Port 1 View  Result Date: 09/26/2021 CLINICAL DATA:  Respiratory distress. EXAM: PORTABLE CHEST 1 VIEW COMPARISON:  May 01, 2021 FINDINGS: There is stable right-sided venous Port-A-Cath positioning. Multiple sternal wires are seen. Mild,  diffuse, chronic appearing increased interstitial lung markings are seen. Mild, stable areas of atelectasis are seen within the bilateral lung bases and mid left lung. Stable elevation of the right hemidiaphragm is seen. There is no evidence of a pleural effusion or pneumothorax. The cardiac silhouette is enlarged and unchanged in size. A radiopaque fusion plate and screws are seen overlying the cervical spine. IMPRESSION: Chronic appearing increased interstitial lung markings with mild, stable bibasilar and mid left lung atelectasis. Electronically Signed   By: Virgina Norfolk M.D.   On: 09/26/2021 23:11    Procedures Procedures   Medications Ordered in ED Medications  albuterol (PROVENTIL) (2.5 MG/3ML) 0.083% nebulizer solution 2.5 mg (2.5 mg  Nebulization Given 09/26/21 2259)  ipratropium (ATROVENT) nebulizer solution 0.5 mg (0.5 mg Nebulization Given 09/26/21 2258)  ipratropium (ATROVENT) nebulizer solution 0.5 mg (0.5 mg Nebulization Given 09/26/21 2258)  albuterol (PROVENTIL) (2.5 MG/3ML) 0.083% nebulizer solution 2.5 mg (2.5 mg Nebulization Given 09/26/21 2259)  furosemide (LASIX) injection 40 mg (40 mg Intravenous Given 09/27/21 0054)    ED Course  I have reviewed the triage vital signs and the nursing notes.  Pertinent labs & imaging results that were available during my care of the patient were reviewed by me and considered in my medical decision making (see chart for details).    MDM Rules/Calculators/A&P                           Presents with severe respiratory distress, persistent hypoxia despite oxygen supplementation.  History of stage IV lung cancer.  He is not tachycardic.  Though his risk for pulmonary embolism is high, suspect less likely to be the cause of his respiratory distress tonight.  Hx of CKD. Patient denies chest pain.  Patient given albuterol nebulizer with improvement.  Will need admission for increased oxygen requirement. Work-up pending.   Patient  remains tachypneic and with additional oxygen.  Acute on chronic kidney failure.  Slightly elevated troponin however not far above baseline.  BNP almost twice baseline.  Patient given Lasix.  Will be admitted.  The patient was discussed with and evaluated by Dr. Randal Buba who agrees with the treatment plan.   Final Clinical Impression(s) / ED Diagnoses Final diagnoses:  Elevated troponin I level  Acute respiratory failure with hypoxia [facility])    Rx / DC Orders ED Discharge Orders     None        Neiko Trivedi, Gwenlyn Perking 09/27/21 Vader, April, MD 09/27/21 [phone]

## 2021-09-27 NOTE — Assessment & Plan Note (Signed)
Continue oral Lumakras. Told wife to bring this med to hospital.

## 2021-09-27 NOTE — Assessment & Plan Note (Signed)
On amiodarone and eliquis.

## 2021-09-27 NOTE — Consult Note (Signed)
Palliative Medicine Inpatient Consult Note  Consulting Provider: Mercy Riding, MD  Reason for consult:   [place] Palliative Medicine Consult  Reason for Consult? 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 fib/flutter on Eliquis, CAD, obesity, hard of hearing, recent "UTI" and ambulatory dysfunction presenting with progressive weakness and acute shortness of breath.  He is admitted with acute on chronic hypoxic RF due to CHF exacerbation.  He was hypoxic to 52% on RA requiring up to 6 L to recover.  BNP elevated to 1200.  CXR with pulmonary edema.   Palliative care was asked to get involved to discuss goals of care in the setting of multiple comorbid conditions.  Clinical Assessment/Goals of Care:  *Please note that this is a verbal dictation therefore any spelling or grammatical errors are due to the "[place] [facility]" system interpretation.  I have reviewed medical records including EPIC notes, labs and imaging, received report from bedside RN, assessed the patient who is resting in bed and not in any acute distress.   I met with Randall Wilkins and his spouse, Randall Wilkins to further discuss diagnosis prognosis, GOC, EOL wishes, disposition and options.  A brief review of Randall Wilkins's chronic diseases was had inclusive of his history of heart failure, stage IV lung cancer, type 1 diabetes mellitus, atrial fibrillation.  We reviewed the reason for admission which was thought to be in the setting of progressive weakness due to urinary infection.  Patient's wife shares that now that Randall Wilkins is hospitalized she realizes that he has an exacerbation of his heart failure.   I introduced Palliative Medicine as specialized medical care for people living with serious illness. It focuses on providing relief from the symptoms and stress of a serious illness. The goal is to improve  quality of life for both the patient and the family.  Randall Wilkins shares that he is from [place], [place] he is lived here throughout the duration of his life.  He has been married to his spouse, Randall Wilkins for the past 48 years.  They share 3 children, 5 grandchildren and 5 great-grandchildren.  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  He enjoys spending time with his family and watching sports on television.  His favorite activity is sitting on his porch on his rocking chair.  He is a man of faith and practices within the [facility] denomination.  Prior to hospitalization Randall Wilkins did require help getting dressed by his wife.  He was able to bathe himself and feed himself.  He was prior able to mobilize with a walker though in the last week has been progressively more physically deconditioned requiring a wheelchair.  A detailed discussion was had today regarding advanced directives, Randall Wilkins and his wife have completed their advanced directives.  A copy has been requested and will be brought in tomorrow to scan into [employer].    Concepts specific to code status, artifical feeding and hydration, continued IV antibiotics and rehospitalization was had.  A MOST form was completed as below:   Cardiopulmonary Resuscitation: Do Not Attempt Resuscitation (DNR/No CPR)  Medical Interventions: Limited Additional Interventions: Use medical treatment, IV fluids and cardiac monitoring as indicated, DO NOT USE intubation or mechanical ventilation. May consider use of less invasive airway support such as BiPAP or CPAP. Also provide comfort measures. Transfer to the hospital if indicated. Avoid  intensive care.   Antibiotics: Determine use of limitation of antibiotics when infection occurs  IV Fluids: IV fluids if indicated  Feeding Tube: No feeding tube   We reviewed the progressive nature of Randall Wilkins's chronic comorbidities most notably his heart failure.  Discussed the progression.  From Randall Wilkins's perspective he does  not have any goals in particular to accomplish as he shares "I have accomplished all the goals I have wanted to end my life".  Patient would like to generally feel better.  I asked if Randall Wilkins would be interested in outpatient palliative support he and his wife are open to this additional level of support.  Patient's spouse shares that they presently have a home health nurse through [facility].  Discussed the importance of continued conversation with family and their  medical providers regarding overall plan of care and treatment options, ensuring decisions are within the context of the patients values and GOCs.  Decision Maker: Randall Wilkins (spouse) [phone]  SUMMARY OF RECOMMENDATIONS   DNAR/DNI  MOST Completed, paper copy placed onto the chart electric copy can be found in Vyn[employer]  DNR Form Completed, paper copy placed onto the chart electric copy can be found in Vyn[facility]  Will obtain a copy of patient's advanced directives  TOC - OP Palliative support  Ongoing incremental   Code Status/Advance Care Planning: DNAR/DNI   Palliative Prophylaxis:  Oral care, mobility  Additional Recommendations (Limitations, Scope, Preferences): Treat what is treatable   Psycho-social/Spiritual:  Desire for further Chaplaincy support: No Additional Recommendations: Education on progressive nature of heart failure   Prognosis: 2 inpatient admissions in the last 6 months, chronic comorbidities, declining physical function places patient at a high 12-month mortality risk.  Discharge Planning: Discharge plan uncertain PT and OT have been ordered for muscular deconditioning.  Vitals:   09/27/21 0810 09/27/21 1149  BP: (!) 120/48 (!) 119/54  Pulse: (!) 59 65  Resp:    Temp: 97.6 F (36.4 C) (!) 97.4 F (36.3 C)  SpO2: 96% 95%    Intake/Output Summary (Last 24 hours) at 09/27/2021 1403 Last data filed at 09/27/2021 1121 Gross per 24 hour  Intake 480 ml  Output 1100 ml  Net -620  ml   Last Weight  Most recent update: 09/27/2021  5:49 AM    Weight  97.5 kg (214 lb 15.2 oz)            Gen: Frail elderly male in mild distress HEENT: moist mucous membranes CV: Regular rate and irregular rhythm PULM: On 6 L nasal cannula with diffuse rhonchi and crackles bilaterally ABD: soft/nontender EXT: (+) edema Neuro: Alert and oriented x2-3 intermittently forgetful  PPS: 50%   This conversation/these recommendations were discussed with patient primary care team, Dr. Gonfa  Time In: 1300 Time Out: 1410 Total Time: 70 Greater than 50%  of this time was spent counseling and coordinating care related to the above assessment and plan.    [place] Palliative Medicine Team Team Cell Phone: [phone] Please utilize secure chat with additional questions, if there is no response within 30 minutes please call the above phone number  Palliative Medicine Team providers are available by phone from 7am to 7pm daily and can be reached through the team cell phone.  Should this patient require assistance outside of these hours, please call the patient's attending physician.   

## 2021-09-27 NOTE — Assessment & Plan Note (Signed)
Admit to telemetry bed. Continue with IV lasix 80 mg bid. Update echo. Wife states pt was scheduled for TEE/cardioversion due to afib next week. Pt may benefit from seeing cardiology if he CHF exacerbation is due to tachycardia mediated reasons.

## 2021-09-27 NOTE — Subjective & Objective (Signed)
CC: weakness, SOB HPI: 80 year old male with a history of stage IV lung cancer, type 1 diabetes, obesity, chronic hypoxic respiratory failure, combined systolic diastolic heart failure, atrial fibrillation, chronic anticoagulation who presents to the ER today with a 2 to 5-7-day history of worsening weakness, 1 day history of shortness of breath.  Wife states the patient has been feeling weak for about a 1 week.  She took him to see her his PCP.  Initially thought the patient had a UTI.  They decided this may be a prostate infection.  Patient placed on oral Keflex.  Patient has been feeling increasingly weak.  Patient normally ambulates with a walker but was but not been able to walk over the last 2 to 3 days.  Wife has been pushing him around with a wheelchair.  Today she try to get the patient into bed but EMS fell at the wheelchair.  She noticed that the patient was breathing hard.  EMS was called.  He was noted be hypoxic with his 2 L of oxygen.  On arrival to the ER, sats were 88% on 6 L.  He dropped to 52% on room air.  Laboratory evaluation showed a beta peptide of 1183.  COVID test was negative.  BUN of 35, creatinine 2.2.  Lactic acid normal is 1.  White count was 8.1.  Hemoglobin of 8.7.  ABG pH 7.44 PCO2 35 PO2 of 90.  Chest x-ray demonstrated increased pulmonary markings consistent with edema.  Wife states the patient has been set up to undergo TEE cardioversion by cardiology this next week.  This is due to atrial flutter.  Patient has been orally loading with amiodarone at home.  Due to acute CHF and acute on chronic respiratory failure, [facility] hospitalist contacted for admission.

## 2021-09-28 ENCOUNTER — Inpatient Hospital Stay (HOSPITAL_COMMUNITY): Payer: Medicare Other

## 2021-09-28 ENCOUNTER — Other Ambulatory Visit (HOSPITAL_COMMUNITY): Payer: Medicare Other

## 2021-09-28 DIAGNOSIS — I4891 Unspecified atrial fibrillation: Secondary | ICD-10-CM | POA: Diagnosis not present

## 2021-09-28 DIAGNOSIS — C3491 Malignant neoplasm of unspecified part of right bronchus or lung: Secondary | ICD-10-CM | POA: Diagnosis not present

## 2021-09-28 DIAGNOSIS — I2510 Atherosclerotic heart disease of native coronary artery without angina pectoris: Secondary | ICD-10-CM | POA: Diagnosis not present

## 2021-09-28 DIAGNOSIS — I5043 Acute on chronic combined systolic (congestive) and diastolic (congestive) heart failure: Secondary | ICD-10-CM | POA: Diagnosis not present

## 2021-09-28 LAB — BASIC METABOLIC PANEL
Anion gap: 11 (ref 5–15)
BUN: 35 mg/dL — ABNORMAL HIGH (ref 8–23)
CO2: 26 mmol/L (ref 22–32)
Calcium: 8.5 mg/dL — ABNORMAL LOW (ref 8.9–10.3)
Chloride: 102 mmol/L (ref 98–111)
Creatinine, Ser: 1.86 mg/dL — ABNORMAL HIGH (ref 0.61–1.24)
GFR, Estimated: 36 mL/min — ABNORMAL LOW (ref >60)
Glucose, Bld: 199 mg/dL — ABNORMAL HIGH (ref 70–99)
Potassium: 3.6 mmol/L (ref 3.5–5.1)
Sodium: 139 mmol/L (ref 135–145)

## 2021-09-28 LAB — RENAL FUNCTION PANEL
Albumin: 2.7 g/dL — ABNORMAL LOW (ref 3.5–5.0)
Anion gap: 10 (ref 5–15)
BUN: 35 mg/dL — ABNORMAL HIGH (ref 8–23)
CO2: 25 mmol/L (ref 22–32)
Calcium: 8.5 mg/dL — ABNORMAL LOW (ref 8.9–10.3)
Chloride: 103 mmol/L (ref 98–111)
Creatinine, Ser: 1.99 mg/dL — ABNORMAL HIGH (ref 0.61–1.24)
GFR, Estimated: 33 mL/min — ABNORMAL LOW (ref >60)
Glucose, Bld: 105 mg/dL — ABNORMAL HIGH (ref 70–99)
Phosphorus: 4.3 mg/dL (ref 2.5–4.6)
Potassium: 3.7 mmol/L (ref 3.5–5.1)
Sodium: 138 mmol/L (ref 135–145)

## 2021-09-28 LAB — CBC
HCT: 32.4 % — ABNORMAL LOW (ref 39.0–52.0)
Hemoglobin: 9.8 g/dL — ABNORMAL LOW (ref 13.0–17.0)
MCH: 28.6 pg (ref 26.0–34.0)
MCHC: 30.2 g/dL (ref 30.0–36.0)
MCV: 94.5 fL (ref 80.0–100.0)
Platelets: 102 10*3/uL — ABNORMAL LOW (ref 150–400)
RBC: 3.43 MIL/uL — ABNORMAL LOW (ref 4.22–5.81)
RDW: 17.3 % — ABNORMAL HIGH (ref 11.5–15.5)
WBC: 8.2 10*3/uL (ref 4.0–10.5)
nRBC: 0 % (ref 0.0–0.2)

## 2021-09-28 LAB — BLOOD GAS, VENOUS
Acid-Base Excess: 1.3 mmol/L (ref 0.0–2.0)
Bicarbonate: 25.2 mmol/L (ref 20.0–28.0)
FIO2: 50
O2 Saturation: 84.2 %
Patient temperature: 37
pCO2, Ven: 38.7 mmHg — ABNORMAL LOW (ref 44.0–60.0)
pH, Ven: 7.429 (ref 7.250–7.430)
pO2, Ven: 50.7 mmHg — ABNORMAL HIGH (ref 32.0–45.0)

## 2021-09-28 LAB — GLUCOSE, CAPILLARY
Glucose-Capillary: 106 mg/dL — ABNORMAL HIGH (ref 70–99)
Glucose-Capillary: 195 mg/dL — ABNORMAL HIGH (ref 70–99)
Glucose-Capillary: 191 mg/dL — ABNORMAL HIGH (ref 70–99)
Glucose-Capillary: 202 mg/dL — ABNORMAL HIGH (ref 70–99)

## 2021-09-28 LAB — CK: Total CK: 38 U/L — ABNORMAL LOW (ref 49–397)

## 2021-09-28 LAB — BRAIN NATRIURETIC PEPTIDE: B Natriuretic Peptide: 1361.1 pg/mL — ABNORMAL HIGH (ref 0.0–100.0)

## 2021-09-28 LAB — MAGNESIUM: Magnesium: 2 mg/dL (ref 1.7–2.4)

## 2021-09-28 LAB — TROPONIN I (HIGH SENSITIVITY): Troponin I (High Sensitivity): 68 ng/L — ABNORMAL HIGH (ref <18)

## 2021-09-28 MED ORDER — SODIUM CHLORIDE 0.9 % IV SOLN
2.0000 g | Freq: Two times a day (BID) | INTRAVENOUS | Status: DC
  Administered 2021-09-28 – 2021-10-03 (×11): 2 g via INTRAVENOUS
  Filled 2021-09-28 (×12): qty 2

## 2021-09-28 MED ORDER — POTASSIUM CHLORIDE CRYS ER 20 MEQ PO TBCR
20.0000 meq | EXTENDED_RELEASE_TABLET | Freq: Once | ORAL | Status: AC
  Administered 2021-09-28: 20 meq via ORAL
  Filled 2021-09-28: qty 1

## 2021-09-28 MED ORDER — SODIUM CHLORIDE 0.9% FLUSH: 10.0000 mL | INTRAVENOUS | Status: DC | PRN

## 2021-09-28 MED ORDER — UMECLIDINIUM BROMIDE 62.5 MCG/INH IN AEPB
1.0000 | INHALATION_SPRAY | Freq: Every day | RESPIRATORY_TRACT | Status: DC
  Administered 2021-09-29 – 2021-10-06 (×8): 1 via RESPIRATORY_TRACT
  Filled 2021-09-28 (×3): qty 7

## 2021-09-28 MED ORDER — BUDESONIDE 0.5 MG/2ML IN SUSP
0.5000 mg | Freq: Two times a day (BID) | RESPIRATORY_TRACT | Status: DC
  Administered 2021-09-28 – 2021-10-06 (×18): 0.5 mg via RESPIRATORY_TRACT
  Filled 2021-09-28 (×18): qty 2

## 2021-09-28 MED ORDER — SODIUM CHLORIDE 0.9% FLUSH
10.0000 mL | Freq: Two times a day (BID) | INTRAVENOUS | Status: DC
  Administered 2021-09-28 – 2021-10-03 (×7): 10 mL

## 2021-09-28 MED ORDER — FUROSEMIDE 10 MG/ML IJ SOLN
60.0000 mg | Freq: Every day | INTRAMUSCULAR | Status: DC
  Administered 2021-09-28 – 2021-10-01 (×4): 60 mg via INTRAVENOUS
  Filled 2021-09-28 (×4): qty 6

## 2021-09-28 MED ORDER — CHLORHEXIDINE GLUCONATE CLOTH 2 % EX PADS
6.0000 | MEDICATED_PAD | Freq: Every day | CUTANEOUS | Status: DC
  Administered 2021-09-28 – 2021-10-03 (×6): 6 via TOPICAL

## 2021-09-28 MED ORDER — SODIUM CHLORIDE 0.9% FLUSH: 10.0000 mL | Freq: Two times a day (BID) | INTRAVENOUS | Status: DC

## 2021-09-28 MED ORDER — HEPARIN (PORCINE) 25000 UT/250ML-% IV SOLN
1350.0000 [IU]/h | INTRAVENOUS | Status: DC
  Administered 2021-09-28: 1200 [IU]/h via INTRAVENOUS
  Filled 2021-09-28: qty 250

## 2021-09-28 MED ORDER — ARFORMOTEROL TARTRATE 15 MCG/2ML IN NEBU
15.0000 ug | INHALATION_SOLUTION | Freq: Two times a day (BID) | RESPIRATORY_TRACT | Status: DC
  Administered 2021-09-28 – 2021-10-06 (×18): 15 ug via RESPIRATORY_TRACT
  Filled 2021-09-28 (×18): qty 2

## 2021-09-28 MED ORDER — IPRATROPIUM-ALBUTEROL 0.5-2.5 (3) MG/3ML IN SOLN
3.0000 mL | RESPIRATORY_TRACT | Status: DC | PRN
  Administered 2021-09-30: 3 mL via RESPIRATORY_TRACT
  Filled 2021-09-28: qty 3

## 2021-09-28 MED ORDER — LEVOTHYROXINE SODIUM 100 MCG/5ML IV SOLN: 44.0000 ug | Freq: Every day | INTRAVENOUS | Status: DC

## 2021-09-28 MED ORDER — METRONIDAZOLE 500 MG/100ML IV SOLN
500.0000 mg | Freq: Two times a day (BID) | INTRAVENOUS | Status: DC
  Administered 2021-09-28 – 2021-09-29 (×3): 500 mg via INTRAVENOUS
  Filled 2021-09-28 (×4): qty 100

## 2021-09-28 MED ORDER — FUROSEMIDE 10 MG/ML IJ SOLN: 60.0000 mg | Freq: Three times a day (TID) | INTRAMUSCULAR | Status: DC

## 2021-09-28 NOTE — Progress Notes (Signed)
PROGRESS NOTE  Randall Wilkins WER:[id number] DOB: [birth year]   PCP: [name], MD  Patient is from: Home.  Lives with his wife.  Uses walker at baseline but lately has been using wheelchair.  DOA: 09/26/2021 LOS: 1  Chief complaints:  Shortness of breath    Brief Narrative / Interim history: 80 year old M with PMH of combined CHF, stage IV lung cancer, hronic hypoxic RF on 2 L, PAH, A. fib/flutter on Eliquis, CAD, DM-1, cobesity, hard of hearing, recent "UTI" and ambulatory dysfunction presenting with progressive weakness and acute shortness of breath.  He is admitted with acute on chronic hypoxic RF due to CHF exacerbation.  He was hypoxic to 52% on RA requiring up to 6 L to recover.  BNP elevated to 1200.  CXR with pulmonary edema.  Troponin 76>> 83.  EKG rate controlled A. fib with occasional PVCs.  Started on IV Lasix.  Patient was to have cardioversion next week.   The next day, cardiology and palliative medicine consulted.  CODE STATUS changed to DNR/DNR.  Remains on IV Lasix  Subjective: Seen and examined earlier this morning and this afternoon.  Patient had increased work of breathing earlier this morning that has improved with IV Lasix.  Repeat CXR with increased bilateral perihilar and bibasilar opacities raising concern for multifocal pneumonia versus pulmonary edema.  His BNP up trended.  He is breathing improved after IV Lasix earlier this morning but gotten worse this afternoon.  He is a started on BiPAP  Objective: Vitals:   09/28/21 0730 09/28/21 0918 09/28/21 1127 09/28/21 1459  BP: (!) 152/71  (!) 128/57   Pulse: 81  71   Resp:   20   Temp: 98.6 F (37 C)  98 F (36.7 C)   TempSrc: Oral  Oral   SpO2: 97% 98% 100% 96%  Weight:      Height:        Intake/Output Summary (Last 24 hours) at 09/28/2021 1507 Last data filed at 09/28/2021 0954 Gross per 24 hour  Intake 720 ml  Output 3300 ml  Net -2580 ml   Filed Weights   09/27/21 0007 09/27/21 0547   Weight: 100.7 kg 97.5 kg    Examination:  GENERAL: No apparent distress.  Nontoxic. HEENT: MMM.  Vision grossly intact.  Diminished hearing. NECK: Supple.  Significant JVD bilaterally. RESP: 90% on BiPAP.  Tachypnea.  Increased work of breathing.  Crackles and rhonchi bilaterally. CVS:  RRR. Heart sounds normal.  ABD/GI/GU: BS+. Abd soft, NTND.  MSK/EXT:  Moves extremities. No apparent deformity.  1+ pitting edema in BLE. SKIN: no apparent skin lesion or wound NEURO: Awake and alert. Oriented appropriately.  No apparent focal neuro deficit. PSYCH: Calm. Normal affect.    Procedures:  None  Microbiology summarized: [id number] and influenza PCR nonreactive. Blood culture NGTD. Urine culture pending. Assessment & Plan: Acute on chronic hypoxic respiratory failure-patient is on 2 L at baseline.  Patient with increased work of breathing, tachypnea and increased oxygen requirement requiring BiPAP despite excellent urine output on diuretics.  Repeat CXR from this morning with increased bilateral opacity.  I wonder if he is aspirating.  He is also on relatively high dose of amiodarone.  He is already on ceftriaxone.  -Continue BiPAP for increased work of breathing -Broaden antibiotic to cefepime and Flagyl -Added Pulmicort, Brovana and as needed DuoNeb -Aspiration precaution -N.p.o. pending SLP eval -Check VBG, troponin and EKG  -Diuretics per cardiology  Acute on chronic combined CHF/severe PAH: TTE in  04/2021 with LVEF of 45 to 50%, mod AS, RVSP of 79 mmHg and mild to moderate TVR.  He presents with generalized weakness and acute dyspnea.  Good compliance with his diuretics, fluid and sodium per his wife.  BNP 1200>> 1400.  CXR worse.  Tachypneic with increased work of breathing and increased O2 requirement.  -Appreciate help by cardiology-patient may need more IV Lasix but defer this to cardiology -Continue holding Imdur and lisinopril given soft blood pressure -Follow  echocardiogram -Monitor fluid status, renal functions and electrolytes. -Sodium and fluid restriction   Elevated troponin/history of CABG in 2016: Likely demand ischemia due to CHF and hypoxia.  No significant delta.  No chest pain.  EKG without ischemic finding. -Repeat EKG and troponin -Continue home meds once cleared by SLP   IDDM-1 with neuropathy and hyperglycemia: A1c 6.6% on 09/09/2021. Recent Labs  Lab 09/27/21 1118 09/27/21 1609 09/27/21 2110 09/28/21 0607 09/28/21 1129  GLUCAP 168* 222* 182* 106* 195*  -Continue current insulin regimen  AKI on CKD-3B/azotemia-seems cardiorenal.  Improving with IV diuretics. Recent Labs    05/20/21 0947 06/11/21 0937 06/17/21 1254 07/22/21 0930 08/20/21 0922 09/08/21 0935 09/27/21 0020 09/27/21 0407 09/28/21 0449 09/28/21 0930  BUN 28* 43* 41* 25* 21 26* 39* 38* 35* 35*  CREATININE 1.46* 1.89* 1.73* 1.67* 1.63* 1.83* 2.22* 2.27* 1.99* 1.86*  -Continue monitoring -Further work-up if worse or no improvement   Persistent A. fib/flutter-rate controlled.  On amiodarone and Eliquis.  Reports good compliance with Eliquis.  Plan was for possible cardioversion next week but he got admitted -Continue amiodarone and Eliquis -Cardiology consulted  Possible UTI: Reportedly had increased frequency when he presented to PCP.  He was started on high-dose Keflex for his renal function.  UA negative -Antibiotics as above -Follow urine culture  Anemia of chronic disease-Baseline Hgb 9-10.  Stable. Recent Labs    05/06/21 0349 05/20/21 0947 06/17/21 1254 07/22/21 0930 08/20/21 0922 09/08/21 0935 09/26/21 2354 09/27/21 0020 09/27/21 0407 09/28/21 0449  HGB 9.9* 10.5* 10.1* 9.7* 9.6* 9.5* 11.6* 8.7* 8.8* 9.8*  -Continue monitoring  Generalized weakness/physical deconditioning/chronic LLE weakness-uses walker at baseline.  Lately using wheelchair -PT/OT consult  Essential hypertension: Normotensive this morning. -IV Lasix as  above -Hold Imdur, amlodipine and lisinopril given soft blood pressures   Adenocarcinoma of right lung, stage 4  -Continue oral Lumakras. Told wife to bring this med to hospital.  BPH without LUTS -Continue Flomax  Hypothyroidism: -Continue home Synthroid  Goal of care-patient with significant comorbidities as above.  Poor long-term prognosis.  Not sure if CPR and intubation are to his best interest.  -Appreciate input by palliative care.  Agree with DNR/DNI.   Body mass index is 33.67 kg/m.         DVT prophylaxis:  apixaban (ELIQUIS) tablet 2.5 mg Start: 09/27/21 1000 SCDs Start: 09/27/21 0317 apixaban (ELIQUIS) tablet 2.5 mg  Code Status: Full code Family Communication: Updated patient's wife at bedside. Level of care: Telemetry Medical Status is: Inpatient  Remains inpatient appropriate because: Due to acute on chronic respiratory failure with increased work of breathing, tachypnea, increased oxygen requirement, fluid overload and IV medication   Consultants:  Cardiology Palliative medicine   Sch Meds:  Scheduled Meds:  amiodarone  200 mg Oral BID   apixaban  2.5 mg Oral BID   arformoterol  15 mcg Nebulization BID   budesonide (PULMICORT) nebulizer solution  0.5 mg Nebulization BID   Chlorhexidine Gluconate Cloth  6 each Topical Daily  furosemide  60 mg Intravenous Daily   insulin aspart  0-15 Units Subcutaneous TID WC   insulin aspart  0-5 Units Subcutaneous QHS   levothyroxine  88 mcg Oral q morning   rosuvastatin  20 mg Oral Daily   sodium chloride flush  10 mL Intravenous Q12H   sodium chloride flush  10-40 mL Intracatheter Q12H   sotorasib  960 mg Oral Daily   tamsulosin  0.4 mg Oral QHS   umeclidinium bromide  1 puff Inhalation Daily   Continuous Infusions:  cefTRIAXone (ROCEPHIN)  IV 1 g (09/28/21 0855)   PRN Meds:.acetaminophen **OR** acetaminophen, ipratropium-albuterol, sodium chloride flush, sodium chloride  flush  Antimicrobials: Anti-infectives (From admission, onward)    Start     Dose/Rate Route Frequency Ordered Stop   09/27/21 0930  cefTRIAXone (ROCEPHIN) 1 g in sodium chloride 0.9 % 100 mL IVPB        1 g 200 mL/hr over 30 Minutes Intravenous Every 24 hours 09/27/21 0839 09/30/21 0929        I have personally reviewed the following labs and images: CBC: Recent Labs  Lab 09/26/21 2354 09/27/21 0020 09/27/21 0407 09/28/21 0449  WBC  --  8.1 6.9 8.2  NEUTROABS  --  6.3 4.7  --   HGB 11.6* 8.7* 8.8* 9.8*  HCT 34.0* 29.4* 28.8* 32.4*  MCV  --  96.4 95.0 94.5  PLT  --  121* 96* 102*   BMP &GFR Recent Labs  Lab 09/26/21 2354 09/27/21 0020 09/27/21 0407 09/28/21 0449 09/28/21 0930  NA 139 137 137 138 139  K 4.0 4.2 4.3 3.7 3.6  CL  --  103 105 103 102  CO2  --  23 24 25 26   GLUCOSE  --  145* 169* 105* 199*  BUN  --  39* 38* 35* 35*  CREATININE  --  2.22* 2.27* 1.99* 1.86*  CALCIUM  --  8.4* 8.4* 8.5* 8.5*  MG  --   --  2.2 2.0  --   PHOS  --   --   --  4.3  --    Estimated Creatinine Clearance: 35.3 mL/min (A) (by C-G formula based on SCr of 1.86 mg/dL (H)). Liver & Pancreas: Recent Labs  Lab 09/27/21 0020 09/27/21 0407 09/28/21 0449  AST 17 15  --   ALT 11 8  --   ALKPHOS 92 89  --   BILITOT 0.9 0.7  --   PROT 5.8* 5.6*  --   ALBUMIN 2.8* 2.6* 2.7*   No results for input(s): LIPASE, AMYLASE in the last 168 hours. No results for input(s): AMMONIA in the last 168 hours. Diabetic: No results for input(s): HGBA1C in the last 72 hours. Recent Labs  Lab 09/27/21 1118 09/27/21 1609 09/27/21 2110 09/28/21 0607 09/28/21 1129  GLUCAP 168* 222* 182* 106* 195*   Cardiac Enzymes: Recent Labs  Lab 09/28/21 0449  CKTOTAL 38*   No results for input(s): PROBNP in the last 8760 hours. Coagulation Profile: Recent Labs  Lab 09/27/21 0020  INR 1.8*   Thyroid Function Tests: No results for input(s): TSH, T4TOTAL, FREET4, T3FREE, THYROIDAB in the last 72  hours. Lipid Profile: No results for input(s): CHOL, HDL, LDLCALC, TRIG, CHOLHDL, LDLDIRECT in the last 72 hours. Anemia Panel: No results for input(s): VITAMINB12, FOLATE, FERRITIN, TIBC, IRON, RETICCTPCT in the last 72 hours. Urine analysis:    Component Value Date/Time   COLORURINE YELLOW 09/27/2021 0021   APPEARANCEUR CLEAR 09/27/2021 0021   LABSPEC  1.011 09/27/2021 0021   PHURINE 5.0 09/27/2021 0021   GLUCOSEU NEGATIVE 09/27/2021 0021   GLUCOSEU NEGATIVE 08/29/2019 0959   HGBUR NEGATIVE 09/27/2021 0021   BILIRUBINUR NEGATIVE 09/27/2021 0021   KETONESUR NEGATIVE 09/27/2021 0021   PROTEINUR 30 (A) 09/27/2021 0021   UROBILINOGEN 0.2 08/29/2019 0959   NITRITE NEGATIVE 09/27/2021 0021   LEUKOCYTESUR NEGATIVE 09/27/2021 0021   Sepsis Labs: Invalid input(s): PROCALCITONIN, [place]  Microbiology: Recent Results (from the past 240 hour(s))  Blood Culture (routine x 2)     Status: None (Preliminary result)   Collection Time: 09/27/21 12:20 AM   Specimen: BLOOD  Result Value Ref Range Status   Specimen Description BLOOD PORTA CATH  Final   Special Requests   Final    BOTTLES DRAWN AEROBIC AND ANAEROBIC Blood Culture adequate volume   Culture   Final    NO GROWTH 1 DAY Performed at [facility], [address] [place], [place] [address]    Report Status PENDING  Incomplete  Resp Panel by RT-PCR (Flu A&B, Covid) Peripheral     Status: None   Collection Time: 09/27/21 12:25 AM   Specimen: Peripheral; Nasopharyngeal(NP) swabs in vial transport medium  Result Value Ref Range Status   SARS Coronavirus 2 by RT PCR NEGATIVE NEGATIVE Final    Comment: (NOTE) SARS-CoV-2 target nucleic acids are NOT DETECTED.  The SARS-CoV-2 RNA is generally detectable in upper respiratory specimens during the acute phase of infection. The lowest concentration of SARS-CoV-2 viral copies this assay can detect is 138 copies/mL. A negative result does not preclude SARS-Cov-2 infection and  should not be used as the sole basis for treatment or other patient management decisions. A negative result may occur with  improper specimen collection/handling, submission of specimen other than nasopharyngeal swab, presence of viral mutation(s) within the areas targeted by this assay, and inadequate number of viral copies(<138 copies/mL). A negative result must be combined with clinical observations, patient history, and epidemiological information. The expected result is Negative.  Fact Sheet for Patients:  EntrepreneurPulse.com.au  Fact Sheet for Healthcare Providers:  IncredibleEmployment.be  This test is no t yet approved or cleared by the [place] FDA and  has been authorized for detection and/or diagnosis of SARS-CoV-2 by FDA under an Emergency Use Authorization (EUA). This EUA will remain  in effect (meaning this test can be used) for the duration of the COVID-19 declaration under Section 564(b)(1) of the Act, 21 U.S.C.section 360bbb-3(b)(1), unless the authorization is terminated  or revoked sooner.       Influenza A by PCR NEGATIVE NEGATIVE Final   Influenza B by PCR NEGATIVE NEGATIVE Final    Comment: (NOTE) The Xpert Xpress SARS-CoV-2/FLU/RSV plus assay is intended as an aid in the diagnosis of influenza from Nasopharyngeal swab specimens and should not be used as a sole basis for treatment. Nasal washings and aspirates are unacceptable for Xpert Xpress SARS-CoV-2/FLU/RSV testing.  Fact Sheet for Patients: EntrepreneurPulse.com.au  Fact Sheet for Healthcare Providers: IncredibleEmployment.be  This test is not yet approved or cleared by the [place] FDA and has been authorized for detection and/or diagnosis of SARS-CoV-2 by FDA under an Emergency Use Authorization (EUA). This EUA will remain in effect (meaning this test can be used) for the duration of the COVID-19 declaration  under Section 564(b)(1) of the Act, 21 U.S.C. section 360bbb-3(b)(1), unless the authorization is terminated or revoked.  Performed at [facility], [place] [address] [place], [place] [address]   Blood  Culture (routine x 2)     Status: None (Preliminary result)   Collection Time: 09/27/21 12:38 AM   Specimen: BLOOD LEFT FOREARM  Result Value Ref Range Status   Specimen Description BLOOD LEFT FOREARM  Final   Special Requests   Final    BOTTLES DRAWN AEROBIC AND ANAEROBIC Blood Culture adequate volume   Culture   Final    NO GROWTH 1 DAY Performed at [facility], [place] [address], [place], [place] [address]    Report Status PENDING  Incomplete    Radiology Studies: DG CHEST PORT 1 VIEW  Result Date: 09/28/2021 CLINICAL DATA:  Shortness of breath EXAM: PORTABLE CHEST 1 VIEW COMPARISON:  Chest x-ray dated 09/26/2021 FINDINGS: Increased bilateral perihilar and bibasilar opacities, some components suggesting dense consolidation and some components of ground-glass opacity. Probable small RIGHT pleural effusion. No pneumothorax is seen. Stable cardiomegaly. RIGHT chest wall Port-A-Cath is stable in position. Median sternotomy wires in place. IMPRESSION: 1. Increased bilateral perihilar and bibasilar opacities, including some components of ground-glass opacity, compatible with multifocal pneumonia versus pulmonary edema. 2. Probable small RIGHT pleural effusion. Electronically Signed   By: Franki Cabot M.D.   On: 09/28/2021 07:33      Beauford Lando T. Vidor  If 7PM-7AM, please contact night-coverage [email or web address] 09/28/2021, 3:07 PM

## 2021-09-28 NOTE — Evaluation (Signed)
Physical Therapy Evaluation Patient Details Name: Randall Wilkins MRN: [id number] DOB: 26-Oct-1941 Today's Date: 09/28/2021  History of Present Illness  80 year old male presents to the ER via EMS 09/26/21 with a 2 to 5-7-day history of worsening weakness resulting in fall when trying to when transferring from wheelchair back to bed. At baseline pt was ambulating with RW, Pt with 1 day history of shortness of breath. On arrival to the ER, sats were 88% on 6 L.  He dropped to 52% on room air. Wife states pt was scheduled for TEE/cardioversion due to afib next week. Admitted for treatment of Acute on chronic combined systolic and diastolic CHF. PMH: stage IV lung cancer, type 1 diabetes, obesity, chronic hypoxic respiratory failure, combined systolic diastolic heart failure, atrial fibrillation, chronic anticoagulation  Clinical Impression  PTA pt living with wife and adult son in single story home with ramped entrance. Pt's wife reports until about 10 days ago pt was supervision for ambulation with RW including walking up and down ramp and getting into car. Pt utilizes transport chair for limited community mobility. Pt's wife provides set up for bathing and dressing, washing his bath and assisting with lower body dressing. Pt is currently limited in safe mobility by increased O2 demand, currently on 15 L HFNC, mild confusion in hospital, in presence of decreased strength and in particular increased posterior lean in upright. With PT after sitting up in chair most of the morning pt requires maxA for coming to standing, and mod to max support to keep CoG over BoS in standing, able to march in place but not progress to ambulation. Pt and wife report ability to get up with nursing and walk to the bathroom. PT recommending HHPT level rehab at discharge given previous success and pt's increased confusion in novel surroundings. Pt's wife report with help of her son she feels like she will be able to provide enough  support at home. PT will continue to follow pt acutely.        Recommendations for follow up therapy are one component of a multi-disciplinary discharge planning process, led by the attending physician.  Recommendations may be updated based on patient status, additional functional criteria and insurance authorization.  Follow Up Recommendations Home health PT;Supervision/Assistance - 24 hour    Equipment Recommendations  None recommended by PT (has necessary equipment)    Recommendations for Other Services OT consult     Precautions / Restrictions Precautions Precautions: Fall Precaution Comments: fall prior to hospitalization Restrictions Weight Bearing Restrictions: No      Mobility  Bed Mobility               General bed mobility comments: OOB in recliner on entry    Transfers Overall transfer level: Needs assistance Equipment used: Rolling walker (2 wheeled) Transfers: Sit to/from Stand Sit to Stand: Max assist         General transfer comment: max A for coming to standing and holding in position of balance, as pt with increased posterior lean.  Ambulation/Gait             General Gait Details: unable to attempt given increased posterior lean         Balance Overall balance assessment: Needs assistance Sitting-balance support: Feet supported;No upper extremity supported Sitting balance-Leahy Scale: Fair     Standing balance support: Bilateral upper extremity supported;During functional activity Standing balance-Leahy Scale: Zero (increased posterior lean in standing) Standing balance comment: requires mod to max support to maintain CoG over  BoS                             Pertinent Vitals/Pain Pain Assessment: No/denies pain    Home Living Family/patient expects to be discharged to:: Private residence Living Arrangements: Spouse/significant other Available Help at Discharge: Family;Available 24 hours/day Type of Home:  House Home Access: Ramped entrance     Home Layout: One level Home Equipment: Grab bars - tub/shower;Walker - 2 wheels;Hand held shower head;Walker - 4 wheels;Electric scooter;Shower seat - built in;Grab bars - toilet;Transport chair;Adaptive equipment      Prior Function Level of Independence: Needs assistance   Gait / Transfers Assistance Needed: Pt reports using RW for short ambulation distances including up and down ramp to car. Wife reports using transport chair when out of house.  ADL's / Homemaking Assistance Needed: Pt's wife assists with shower transfers and lower body bathing/dressing.  Comments: increased weakness progressively worse over last week     Hand Dominance   Dominant Hand: Right    Extremity/Trunk Assessment   Upper Extremity Assessment Upper Extremity Assessment: Defer to OT evaluation    Lower Extremity Assessment Lower Extremity Assessment: LLE deficits/detail;Generalized weakness LLE Deficits / Details: longstanding L dropfoot wears braced shoe at baseline, knee and ankle ROM WFL, hip knee and plantarflexion grossly 3+/5 LLE Sensation: decreased light touch;decreased proprioception LLE Coordination: decreased fine motor;decreased gross motor       Communication   Communication: HOH  Cognition Arousal/Alertness: Awake/alert Behavior During Therapy: WFL for tasks assessed/performed Overall Cognitive Status: Impaired/Different from baseline Area of Impairment: Orientation;Attention;Memory;Following commands;Safety/judgement;Awareness;Problem solving                 Orientation Level: Disoriented to;Time Current Attention Level: Selective Memory: Decreased short-term memory Following Commands: Follows one step commands with increased time;Follows multi-step commands with increased time Safety/Judgement: Decreased awareness of safety Awareness: Emergent Problem Solving: Slow processing;Decreased initiation;Difficulty sequencing;Requires  verbal cues General Comments: pt wife reports confusion at baseline, pt disoriented to time and requires increased time for processing and command follow      General Comments General comments (skin integrity, edema, etc.): Pt wife present and provides home set up and PLOF, SaO2 on 15L HFNC >92%O2 throughout session    Exercises General Exercises - Lower Extremity Hip Flexion/Marching: AROM;10 reps;Both;Standing (pt requiring modA to keep in upright position with marching in place due to increased posterior lean)   Assessment/Plan    PT Assessment Patient needs continued PT services  PT Problem List Decreased strength;Decreased range of motion;Decreased activity tolerance;Decreased balance;Decreased mobility;Decreased cognition;Cardiopulmonary status limiting activity       PT Treatment Interventions DME instruction;Gait training;Therapeutic activities;Therapeutic exercise;Balance training;Functional mobility training;Cognitive remediation;Patient/family education    PT Goals (Current goals can be found in the Care Plan section)  Acute Rehab PT Goals Patient Stated Goal: be able to walk again PT Goal Formulation: With patient/family Time For Goal Achievement: 10/12/21 Potential to Achieve Goals: Fair    Frequency Min 3X/week    AM-PAC PT "6 Clicks" Mobility  Outcome Measure Help needed turning from your back to your side while in a flat bed without using bedrails?: A Little Help needed moving from lying on your back to sitting on the side of a flat bed without using bedrails?: A Little Help needed moving to and from a bed to a chair (including a wheelchair)?: A Lot Help needed standing up from a chair using your arms (e.g., wheelchair or bedside chair)?: A  Lot Help needed to walk in hospital room?: Total Help needed climbing 3-5 steps with a railing? : Total 6 Click Score: 12    End of Session Equipment Utilized During Treatment: Gait belt;Oxygen Activity Tolerance:  Treatment limited secondary to medical complications (Comment);Patient tolerated treatment well (increased O2 demand) Patient left: in chair;with chair alarm set;with family/visitor present Nurse Communication: Mobility status PT Visit Diagnosis: Unsteadiness on feet (R26.81);Repeated falls (R29.6);Muscle weakness (generalized) (M62.81);History of falling (Z91.81);Difficulty in walking, not elsewhere classified (R26.2)    Time: 0034-9179 PT Time Calculation (min) (ACUTE ONLY): 33 min   Charges:   PT Evaluation $PT Eval Moderate Complexity: 1 Mod PT Treatments $Therapeutic Activity: 8-22 mins        Deloria Brassfield B. Migdalia Dk PT, DPT Acute Rehabilitation Services Pager [phone] Office [phone]   [place] 09/28/2021, 12:43 PM

## 2021-09-28 NOTE — Progress Notes (Signed)
Subjective:  Denies SSCP, palpitations or Dyspnea   Objective:  Vitals:   09/27/21 2334 09/28/21 0053 09/28/21 0408 09/28/21 0730  BP: (!) 132/59  126/76 (!) 152/71  Pulse: 75  74 81  Resp: 20 (!) 21 (!) 21   Temp: (!) 101.5 F (38.6 C) 98.5 F (36.9 C) 97.8 F (36.6 C) 98.6 F (37 C)  TempSrc: Axillary Axillary  Oral  SpO2: 100%   97%  Weight:      Height:        Intake/Output from previous day:  Intake/Output Summary (Last 24 hours) at 09/28/2021 0839 Last data filed at 09/28/2021 0731 Gross per 24 hour  Intake 560 ml  Output 3450 ml  Net -2890 ml    Physical Exam: Chronically ill elderly male Decreased BS bases AS murmur  Plus 2 LE edema with chronic stasis Abdomen benign BS positive Palpable DP bilaterally    Lab Results: Basic Metabolic Panel: Recent Labs    09/27/21 0407 09/28/21 0449  NA 137 138  K 4.3 3.7  CL 105 103  CO2 24 25  GLUCOSE 169* 105*  BUN 38* 35*  CREATININE 2.27* 1.99*  CALCIUM 8.4* 8.5*  MG 2.2 2.0  PHOS  --  4.3   Liver Function Tests: Recent Labs    09/27/21 0020 09/27/21 0407 09/28/21 0449  AST 17 15  --   ALT 11 8  --   ALKPHOS 92 89  --   BILITOT 0.9 0.7  --   PROT 5.8* 5.6*  --   ALBUMIN 2.8* 2.6* 2.7*   No results for input(s): LIPASE, AMYLASE in the last 72 hours. CBC: Recent Labs    09/27/21 0020 09/27/21 0407 09/28/21 0449  WBC 8.1 6.9 8.2  NEUTROABS 6.3 4.7  --   HGB 8.7* 8.8* 9.8*  HCT 29.4* 28.8* 32.4*  MCV 96.4 95.0 94.5  PLT 121* 96* 102*   Cardiac Enzymes: Recent Labs    09/28/21 0449  CKTOTAL 38*     Imaging: DG CHEST PORT 1 VIEW  Result Date: 09/28/2021 CLINICAL DATA:  Shortness of breath EXAM: PORTABLE CHEST 1 VIEW COMPARISON:  Chest x-ray dated 09/26/2021 FINDINGS: Increased bilateral perihilar and bibasilar opacities, some components suggesting dense consolidation and some components of ground-glass opacity. Probable small RIGHT pleural effusion. No pneumothorax is seen.  Stable cardiomegaly. RIGHT chest wall Port-A-Cath is stable in position. Median sternotomy wires in place. IMPRESSION: 1. Increased bilateral perihilar and bibasilar opacities, including some components of ground-glass opacity, compatible with multifocal pneumonia versus pulmonary edema. 2. Probable small RIGHT pleural effusion. Electronically Signed   By: Franki Cabot M.D.   On: 09/28/2021 07:33   DG Chest Port 1 View  Result Date: 09/26/2021 CLINICAL DATA:  Respiratory distress. EXAM: PORTABLE CHEST 1 VIEW COMPARISON:  May 01, 2021 FINDINGS: There is stable right-sided venous Port-A-Cath positioning. Multiple sternal wires are seen. Mild, diffuse, chronic appearing increased interstitial lung markings are seen. Mild, stable areas of atelectasis are seen within the bilateral lung bases and mid left lung. Stable elevation of the right hemidiaphragm is seen. There is no evidence of a pleural effusion or pneumothorax. The cardiac silhouette is enlarged and unchanged in size. A radiopaque fusion plate and screws are seen overlying the cervical spine. IMPRESSION: Chronic appearing increased interstitial lung markings with mild, stable bibasilar and mid left lung atelectasis. Electronically Signed   By: Virgina Norfolk M.D.   On: 09/26/2021 23:11    Cardiac Studies:  ECG: afib PVC  nonspecific ST changes    Telemetry:  afib rates 80's   Echo: pending TEE 05/06/21 EF 55-60% moderate AS mean gradient 24 mmHg   Medications:    amiodarone  200 mg Oral BID   apixaban  2.5 mg Oral BID   arformoterol  15 mcg Nebulization BID   budesonide (PULMICORT) nebulizer solution  0.5 mg Nebulization BID   Chlorhexidine Gluconate Cloth  6 each Topical Daily   furosemide  60 mg Intravenous Q8H   insulin aspart  0-15 Units Subcutaneous TID WC   insulin aspart  0-5 Units Subcutaneous QHS   levothyroxine  88 mcg Oral q morning   rosuvastatin  20 mg Oral Daily   sodium chloride flush  10 mL Intravenous Q12H   sodium  chloride flush  10-40 mL Intracatheter Q12H   sotorasib  960 mg Oral Daily   tamsulosin  0.4 mg Oral QHS   umeclidinium bromide  1 puff Inhalation Daily      cefTRIAXone (ROCEPHIN)  IV 1 g (09/27/21 1052)    Assessment/Plan:  PAF:  post multiple DCC;s most recently May 2022 with TEE. On Amiodarone since 09/04/21 with load. On appropriate low dose eliquis 2.5 mg bid with no missed doses Rate is fine and not clear that afib is contributing to his FTT and poor ambulation given stage 4 lung cancer with active Rx. Agree with holding norvasc. No need for AV nodal drug with SSS and bradycardia. Continue amiodarone 200 mg daily Plan on [facility] - may try to move up to Monday or Tuesday will check schedule in am Make NPO in case can be done in am  CAD:  post distant CABG and stenting native RCA EF has been normal no angina stable Limits choice of AAT to low dose Tikosyn or amiodarone AS:  moderate and can contribute to his fatigue and dyspnea Not a candidate for even TAVR with current functional capacity and stage 4 lung cancer Lung Cancer significant anemia Hct 28.8 stable by recent CT on oral Rx  CRF:  He is volume overloaded on exam with LE edema , dyspnea and elevated BNP 1183 Normal home dose K 3.7 supplement BUN 35 Cr 1.99 improved from 2.27 Decreased lasix to 60 mg iv daily Over 2 L diuresis  Jenkins Rouge 09/28/2021, 8:39 AM

## 2021-09-28 NOTE — Progress Notes (Signed)
Patient's wife notified me pt O2 was dropping, checked on pt, audible crackles and struggling for breath. [place] fixed in nare, O2 went up but pt still struggling. RT called, pt placed on bipap, MD notified

## 2021-09-28 NOTE — Progress Notes (Signed)
   09/28/21 9675  Provider Notification  Provider Name/Title V. Rathore, MD/ T. Cyndia Skeeters, MD  Date Provider Notified 09/28/21  Time Provider Notified [phone]  Notification Type Page  Notification Reason Other (Comment) (increased o2 needs/increased work of breathing)  Provider response Other (Comment) (give lasix now)  Date of Provider Response 09/28/21  Time of Provider Response [phone]  This RN called into patients room due to increased work of breathing and low O2 per [place], [place].  Pt was bumped to 15L HFNC and also placed on NRB at 15LNC in order to get O2 back up to 90s from low 80s/high70s.  Pt having increased work of breathing and tachypneic at 35-40 breaths per minute.  Spoke with Dr. Marlowe Sax and was instructed to give pt's lasix early as pt having coarse crackles throughout lungs.  This RN also consulted Clarise Cruz, RT regarding bipap, but RT would like to defer to MD preference at this time.  Also messaged Dr. Cyndia Skeeters on secure chat regarding patient status so the MD is aware when he does rounds today.  After given lasix per [facility], pt is started to urinate and relax slightly.  Wife updated at bedside regarding plan at this time.

## 2021-09-28 NOTE — Progress Notes (Signed)
ANTICOAGULATION CONSULT NOTE - Initial Consult  Pharmacy Consult for heparin  Indication: atrial fibrillation  Allergies  Allergen Reactions   Actos [Pioglitazone] Swelling   Spironolactone Itching   Metformin And Related Nausea Only    3.16.2022 pt is currently taking this medication at home   Niaspan [Niacin Er] Itching and Rash   Wound Dressing Adhesive Rash    Patient Measurements: Height: 5\' 7"  (170.2 cm) Weight: 97.5 kg (214 lb 15.2 oz) IBW/kg (Calculated) : 66.1 Heparin Dosing Weight: 87kg  Vital Signs: Temp: 98.9 F (37.2 C) (10/16 1936) Temp Source: Oral (10/16 1936) BP: 156/100 (10/16 1936) Pulse Rate: 79 (10/16 1936)  Labs: Recent Labs    09/27/21 0020 09/27/21 0156 09/27/21 0407 09/28/21 0449 09/28/21 0930 09/28/21 1658  HGB 8.7*  --  8.8* 9.8*  --   --   HCT 29.4*  --  28.8* 32.4*  --   --   PLT 121*  --  96* 102*  --   --   APTT 48*  --   --   --   --   --   LABPROT 20.4*  --   --   --   --   --   INR 1.8*  --   --   --   --   --   CREATININE 2.22*  --  2.27* 1.99* 1.86*  --   CKTOTAL  --   --   --  38*  --   --   TROPONINIHS 76* 83*  --   --   --  68*    Estimated Creatinine Clearance: 35.3 mL/min (A) (by C-G formula based on SCr of 1.86 mg/dL (H)).   Medical History: Past Medical History:  Diagnosis Date   Aortic stenosis, mild 11/14/2013   Aortic valve sclerosis 03/29/2015   Bilateral leg edema 05/21/2014   CAD (coronary artery disease)    Chronic diastolic congestive heart failure (HCC)    Chronic kidney disease (CKD), stage III (moderate) (HCC)    Diabetes 1.5, managed as type 1 ([place]) 02/04/2013   Diabetic peripheral neuropathy associated with type 1 diabetes mellitus ([place]) 11/14/2013   Dysphagia    Dyspnea on exertion 03/21/2015   Exogenous obesity    Gout    Heart attack ([place])    History of nuclear stress test 07/2011   dipyridamole; fixed inferolateral defect, worse at stress than rest; no reversible ischemia; low risk scan     Hyperlipidemia    Hypertension    Hypothyroidism    Insulin dependent diabetes mellitus    Left foot drop    Left main coronary artery disease 03/28/2015   lung ca dx'd 08/2020   Macular pucker, right eye 01/29/2021   Vitrectomy membrane peel right eye 02-12-2021   Memory loss    Obesity (BMI 30-39.9) 11/14/2013   Obstructive sleep apnea 03/21/2015   OSA on CPAP    uses a cpap   Peptic ulcer with hemorrhage 03/28/2015   Peripheral neuropathy    Pneumonia    Rhabdomyolysis    S/P CABG x 2 03/29/2015   LIMA to Diagonal, SVG to OM, EVH via right thigh   Stroke ([place])    L patietal with small scattered lacunar infarcts   Thrombocytopenia ([place]) 03/21/2015   Venous insufficiency    Weakness generalized 03/21/2015    Assessment: 80 year old male with history of afib on apixaban prior to admit. Patient having swallowing difficulties this evening so new orders received to bridge with heparin  until able to restart oral anticoagulation.   Will need to monitor aptt's until they correlate with heparin levels.   Goal of Therapy:  Heparin level 0.3-0.7 units/ml aPTT 66-102 seconds Monitor platelets by anticoagulation protocol: Yes   Plan:  Start heparin infusion at 1200 units/hr Check anti-Xa level in 8 hours and daily while on heparin Continue to monitor H&H and platelets  Erin Hearing PharmD., BCPS Clinical Pharmacist 09/28/2021 8:30 PM

## 2021-09-28 NOTE — Evaluation (Addendum)
Clinical/Bedside Swallow Evaluation Patient Details  Name: Randall Wilkins MRN: [id number] Date of Birth: 1941/09/19  Today's Date: 09/28/2021 Time: SLP Start Time (ACUTE ONLY): 27 SLP Stop Time (ACUTE ONLY): 9622 SLP Time Calculation (min) (ACUTE ONLY): 13 min  Past Medical History:  Past Medical History:  Diagnosis Date   Aortic stenosis, mild 11/14/2013   Aortic valve sclerosis 03/29/2015   Bilateral leg edema 05/21/2014   CAD (coronary artery disease)    Chronic diastolic congestive heart failure (HCC)    Chronic kidney disease (CKD), stage III (moderate) (HCC)    Diabetes 1.5, managed as type 1 ([place]) 02/04/2013   Diabetic peripheral neuropathy associated with type 1 diabetes mellitus ([place]) 11/14/2013   Dysphagia    Dyspnea on exertion 03/21/2015   Exogenous obesity    Gout    Heart attack ([place])    History of nuclear stress test 07/2011   dipyridamole; fixed inferolateral defect, worse at stress than rest; no reversible ischemia; low risk scan    Hyperlipidemia    Hypertension    Hypothyroidism    Insulin dependent diabetes mellitus    Left foot drop    Left main coronary artery disease 03/28/2015   lung ca dx'd 08/2020   Macular pucker, right eye 01/29/2021   Vitrectomy membrane peel right eye 02-12-2021   Memory loss    Obesity (BMI 30-39.9) 11/14/2013   Obstructive sleep apnea 03/21/2015   OSA on CPAP    uses a cpap   Peptic ulcer with hemorrhage 03/28/2015   Peripheral neuropathy    Pneumonia    Rhabdomyolysis    S/P CABG x 2 03/29/2015   LIMA to Diagonal, SVG to OM, EVH via right thigh   Stroke ([place])    L patietal with small scattered lacunar infarcts   Thrombocytopenia ([place]) 03/21/2015   Venous insufficiency    Weakness generalized 03/21/2015   Past Surgical History:  Past Surgical History:  Procedure Laterality Date   BACK SURGERY  2002   lumbosacral. 11 back surgeries total   BRONCHIAL BIOPSY  08/06/2020   Procedure: BRONCHIAL BIOPSIES;  Surgeon: Collene Gobble, MD;   Location[facility] ENDOSCOPY;  Service: Pulmonary;;   BRONCHIAL BRUSHINGS  08/06/2020   Procedure: BRONCHIAL BRUSHINGS;  Surgeon: Collene Gobble, MD;  Location: [facility] ENDOSCOPY;  Service: Pulmonary;;   BRONCHIAL NEEDLE ASPIRATION BIOPSY  08/06/2020   Procedure: BRONCHIAL NEEDLE ASPIRATION BIOPSIES;  Surgeon: Collene Gobble, MD;  Location: [place];  Service: Pulmonary;;   CARDIOVERSION N/A 05/06/2021   Procedure: CARDIOVERSION;  Surgeon: Geralynn Rile, MD;  Location: [place];  Service: Cardiovascular;  Laterality: N/A;   Carotid Doppler  03/2013   bilat bulb/prox ICAs - mild amount of fibrous plaque with no evidence of diameter reduction   CARPAL TUNNEL RELEASE Bilateral 08/09/2014   Procedure: BILATERAL CARPAL TUNNEL RELEASE;  Surgeon: Daryll Brod, MD;  Location: [place];  Service: Orthopedics;  Laterality: Bilateral;  ANESTHESIA:  IV REGIONAL BIL FAB   CHOLECYSTECTOMY     COLONOSCOPY     CORONARY ANGIOPLASTY  10/13/1996   CORONARY ANGIOPLASTY  09/21/1989   emergency PTCA   CORONARY ANGIOPLASTY  10/13/1996   Multi-Link diagonal & OD stenting (Dr. Marella Chimes)   [place]  12/03/1997   disease of mid DX-1 ~50% & in mid PLA & PDA (distal lesions) (Dr. Marella Chimes)    CORONARY ANGIOPLASTY  10/14/1999   progression of disease distal PLA & PDA; progression of disease prox RCA - moderate (Dr.  R. Rollene Fare)    CORONARY ANGIOPLASTY WITH STENT PLACEMENT  04/04/2004   4.0x83mm non-DES (thrombectomy via AngioJet) to RCA for high grade stenosis (Dr. Marella Chimes)   CORONARY ARTERY BYPASS GRAFT N/A 03/29/2015   Procedure: CORONARY ARTERY BYPASS GRAFTING TIMES TWO USING LEFT INTERNAL MAMMARY ARTERY AND RIGHT LEG GREATER SAPHENOUS VEIN HARVESTED ENDOSCOPICALLY.;  Surgeon: Rexene Alberts, MD;  Location: [place];  Service: Open Heart Surgery;  Laterality: N/A;   ESOPHAGOGASTRODUODENOSCOPY N/A 03/27/2015   Procedure: ESOPHAGOGASTRODUODENOSCOPY (EGD);  Surgeon: Clarene Essex, MD;  Location[facility] ENDOSCOPY;  Service: Endoscopy;  Laterality: N/A;  possible dilation   ESOPHAGOGASTRODUODENOSCOPY N/A 10/05/2020   Procedure: ESOPHAGOGASTRODUODENOSCOPY (EGD);  Surgeon: Arta Silence, MD;  Location: [facility] ENDOS[facility];  Service: Endoscopy;  Laterality: N/A;   EYE SURGERY Right    macular fold-02/2021   FINE NEEDLE ASPIRATION  08/06/2020   Procedure: FINE NEEDLE ASPIRATION (FNA) LINEAR;  Surgeon: Collene Gobble, MD;  Location: [place] ENDOSCOPY;  Service: Pulmonary;;   IR IMAGING GUIDED PORT INSERTION  10/07/2020   LEFT HEART CATHETERIZATION WITH CORONARY ANGIOGRAM N/A 03/28/2015   Procedure: LEFT HEART CATHETERIZATION WITH CORONARY ANGIOGRAM;  Surgeon: Peter M Martinique, MD;  Location: [facility] CATH LAB;  Service: Cardiovascular;  Laterality: N/A;   SINUS ENDO W/FUSION     TEE WITHOUT CARDIOVERSION N/A 05/06/2021   Procedure: TRANSESOPHAGEAL ECHOCARDIOGRAM (TEE);  Surgeon: Geralynn Rile, MD;  Location: [place];  Service: Cardiovascular;  Laterality: N/A;   TONSILLECTOMY     TRANSTHORACIC ECHOCARDIOGRAM  08/08/2013   EF 55-60%, mild conc hypertrophy, grade 1 diastolic dysfunction; AV with mild stenosis; LA & RA mildly dilated   VIDEO BRONCHOSCOPY WITH ENDOBRONCHIAL NAVIGATION N/A 08/06/2020   Procedure: VIDEO BRONCHOSCOPY WITH ENDOBRONCHIAL NAVIGATION;  Surgeon: Collene Gobble, MD;  Location: [place] ENDOSCOPY;  Service: Pulmonary;  Laterality: N/A;   VIDEO BRONCHOSCOPY WITH ENDOBRONCHIAL ULTRASOUND N/A 08/06/2020   Procedure: VIDEO BRONCHOSCOPY WITH ENDOBRONCHIAL ULTRASOUND;  Surgeon: Collene Gobble, MD;  Location: [place] ENDOSCOPY;  Service: Pulmonary;  Laterality: N/A;   HPI:  80 year old male who presented to the ER today with a 2 to 5-7-day history of worsening weakness, 1 day history of shortness of breath.  Pt fell during transfer with wife and she noticed him breathing hard.  EMS was called.  He was noted be hypoxic with his 2 L of oxygen.  CXR 10/16: "Increased bilateral  perihilar and bibasilar opacities, including  some components of ground-glass opacity, compatible with multifocal  pneumonia versus pulmonary edema." Pt with a history of stage IV lung cancer, type 1 diabetes, obesity, chronic hypoxic respiratory failure, combined systolic diastolic heart failure, atrial fibrillation, chronic anticoagulation.    Assessment / Plan / Recommendation  Clinical Impression  Pt presents with functional swallowing as assessed clinically.  Pt tolerated all consistencies trialed.  There were multiple swallows (2) with liquids, but no other s/s of aspiration.  Pt exhibited good oral clearance of solids. Wife reports hx of esophageal dilation.  She notes he sometimes coughs with cold drinks, and that if he drinks too quickly he can get strangled.  Pt takes all pills without difficulty except potassium pill which she dissolves at home.  Pt is taking pill broken at facility.    Recommend continuing regular texture diet with thin liquids.  ADDENDUM: After conferring with MD, there remains a concern for prandial aspiration despite good tolerance of POs at bedside.  MD changed pt's diet to NPO and we will proceed to instrumental swallow assessment (MBSS) next date.  SLP Visit Diagnosis: Dysphagia, unspecified (R13.10)    Aspiration Risk  No limitations    Diet Recommendation Regular;Thin liquid   Liquid Administration via: Cup;Straw Medication Administration: Whole meds with liquid Supervision: Staff to assist with self feeding Compensations: Slow rate;Small sips/bites Postural Changes: Seated upright at 90 degrees    Other  Recommendations Oral Care Recommendations: Oral care BID    Recommendations for follow up therapy are one component of a multi-disciplinary discharge planning process, led by the attending physician.  Recommendations may be updated based on patient status, additional functional criteria and insurance authorization.  Follow up Recommendations None       Frequency and Duration  (N/A)          Prognosis Prognosis for Safe Diet Advancement:  (N/A)      Swallow Study   General Date of Onset: 09/27/21 HPI: 80 year old male who presented to the ER today with a 2 to 5-7-day history of worsening weakness, 1 day history of shortness of breath.  Pt fell during transfer with wife and she noticed him breathing hard.  EMS was called.  He was noted be hypoxic with his 2 L of oxygen.  CXR 10/16: "Increased bilateral perihilar and bibasilar opacities, including  some components of ground-glass opacity, compatible with multifocal  pneumonia versus pulmonary edema." Pt with a history of stage IV lung cancer, type 1 diabetes, obesity, chronic hypoxic respiratory failure, combined systolic diastolic heart failure, atrial fibrillation, chronic anticoagulation. Type of Study: Bedside Swallow Evaluation Previous Swallow Assessment: Prior admission 2016 with recommendations for regular diet with thin liquid Diet Prior to this Study: Regular;Thin liquids Temperature Spikes Noted: No Respiratory Status: Nasal cannula History of Recent Intubation: No Behavior/Cognition: Alert;Cooperative;Pleasant mood Oral Cavity Assessment: Within Functional Limits Oral Care Completed by SLP: No Oral Cavity - Dentition: Adequate natural dentition Self-Feeding Abilities: Needs assist Patient Positioning: Upright in bed Baseline Vocal Quality: Normal Volitional Cough: Strong Volitional Swallow: Able to elicit    Oral/Motor/Sensory Function Overall Oral Motor/Sensory Function: Within functional limits Facial ROM: Within Functional Limits Facial Symmetry: Within Functional Limits Lingual ROM: Within Functional Limits Lingual Symmetry: Within Functional Limits Lingual Strength: Reduced Velum: Within Functional Limits Mandible: Within Functional Limits   Ice Chips Ice chips: Not tested   Thin Liquid Thin Liquid: Impaired Pharyngeal  Phase Impairments: Multiple  swallows    Nectar Thick Nectar Thick Liquid: Not tested   Honey Thick Honey Thick Liquid: Not tested   Puree Puree: Within functional limits Presentation: Spoon   Solid     Solid: Within functional limits      Celedonio Savage, [place], [place] Office: [phone]; Pager [phone]): [phone] 09/28/2021,2:19 PM

## 2021-09-29 ENCOUNTER — Encounter (HOSPITAL_COMMUNITY): Payer: Self-pay | Admitting: Internal Medicine

## 2021-09-29 ENCOUNTER — Inpatient Hospital Stay (HOSPITAL_COMMUNITY): Payer: Medicare Other

## 2021-09-29 DIAGNOSIS — I4892 Unspecified atrial flutter: Secondary | ICD-10-CM | POA: Diagnosis not present

## 2021-09-29 DIAGNOSIS — I2510 Atherosclerotic heart disease of native coronary artery without angina pectoris: Secondary | ICD-10-CM | POA: Diagnosis not present

## 2021-09-29 DIAGNOSIS — I5021 Acute systolic (congestive) heart failure: Secondary | ICD-10-CM

## 2021-09-29 DIAGNOSIS — I5043 Acute on chronic combined systolic (congestive) and diastolic (congestive) heart failure: Secondary | ICD-10-CM | POA: Diagnosis not present

## 2021-09-29 DIAGNOSIS — Z515 Encounter for palliative care: Secondary | ICD-10-CM | POA: Diagnosis not present

## 2021-09-29 DIAGNOSIS — C3491 Malignant neoplasm of unspecified part of right bronchus or lung: Secondary | ICD-10-CM | POA: Diagnosis not present

## 2021-09-29 DIAGNOSIS — I4891 Unspecified atrial fibrillation: Secondary | ICD-10-CM | POA: Diagnosis not present

## 2021-09-29 LAB — RENAL FUNCTION PANEL
Albumin: 2.6 g/dL — ABNORMAL LOW (ref 3.5–5.0)
Anion gap: 10 (ref 5–15)
BUN: 36 mg/dL — ABNORMAL HIGH (ref 8–23)
CO2: 24 mmol/L (ref 22–32)
Calcium: 8.6 mg/dL — ABNORMAL LOW (ref 8.9–10.3)
Chloride: 102 mmol/L (ref 98–111)
Creatinine, Ser: 1.76 mg/dL — ABNORMAL HIGH (ref 0.61–1.24)
GFR, Estimated: 39 mL/min — ABNORMAL LOW (ref >60)
Glucose, Bld: 141 mg/dL — ABNORMAL HIGH (ref 70–99)
Phosphorus: 4.2 mg/dL (ref 2.5–4.6)
Potassium: 3.7 mmol/L (ref 3.5–5.1)
Sodium: 136 mmol/L (ref 135–145)

## 2021-09-29 LAB — CBC
HCT: 31.6 % — ABNORMAL LOW (ref 39.0–52.0)
Hemoglobin: 9.7 g/dL — ABNORMAL LOW (ref 13.0–17.0)
MCH: 28.7 pg (ref 26.0–34.0)
MCHC: 30.7 g/dL (ref 30.0–36.0)
MCV: 93.5 fL (ref 80.0–100.0)
Platelets: 110 10*3/uL — ABNORMAL LOW (ref 150–400)
RBC: 3.38 MIL/uL — ABNORMAL LOW (ref 4.22–5.81)
RDW: 17.2 % — ABNORMAL HIGH (ref 11.5–15.5)
WBC: 9.8 10*3/uL (ref 4.0–10.5)
nRBC: 0 % (ref 0.0–0.2)

## 2021-09-29 LAB — ECHOCARDIOGRAM COMPLETE
AR max vel: 0.8 cm2
AV Area VTI: 0.7 cm2
AV Area mean vel: 1.06 cm2
AV Mean grad: 21 mmHg
AV Peak grad: 49.8 mmHg
Ao pk vel: 3.53 m/s
Area-P 1/2: 5.02 cm2
Height: 67 in
MV VTI: 1.78 cm2
S' Lateral: 3.7 cm
Weight: 3439.18 oz

## 2021-09-29 LAB — GLUCOSE, CAPILLARY
Glucose-Capillary: 139 mg/dL — ABNORMAL HIGH (ref 70–99)
Glucose-Capillary: 141 mg/dL — ABNORMAL HIGH (ref 70–99)
Glucose-Capillary: 119 mg/dL — ABNORMAL HIGH (ref 70–99)
Glucose-Capillary: 198 mg/dL — ABNORMAL HIGH (ref 70–99)
Glucose-Capillary: 160 mg/dL — ABNORMAL HIGH (ref 70–99)

## 2021-09-29 LAB — MAGNESIUM: Magnesium: 2.3 mg/dL (ref 1.7–2.4)

## 2021-09-29 LAB — HEPARIN LEVEL (UNFRACTIONATED): Heparin Unfractionated: >1.1 IU/mL — ABNORMAL HIGH (ref 0.30–0.70)

## 2021-09-29 LAB — APTT: aPTT: 55 seconds — ABNORMAL HIGH (ref 24–36)

## 2021-09-29 MED ORDER — APIXABAN 2.5 MG PO TABS
2.5000 mg | ORAL_TABLET | Freq: Two times a day (BID) | ORAL | Status: DC
  Administered 2021-09-29 – 2021-09-30 (×3): 2.5 mg via ORAL
  Filled 2021-09-29 (×3): qty 1

## 2021-09-29 NOTE — Procedures (Addendum)
Objective Swallowing Evaluation: Type of Study: FEES - Flexible Endoscopic Evaluation of Swallowing   Patient Details  Name: Randall Wilkins MRN: [id number] Date of Birth: [birth year]  Today's Date: 09/29/2021 Time: SLP Start Time (ACUTE ONLY): 6283 -SLP Stop Time (ACUTE ONLY): 6629  SLP Time Calculation (min) (ACUTE ONLY): 28 min   Past Medical History:  Past Medical History:  Diagnosis Date   Aortic stenosis, mild 11/14/2013   Aortic valve sclerosis 03/29/2015   Bilateral leg edema 05/21/2014   CAD (coronary artery disease)    Chronic diastolic congestive heart failure (HCC)    Chronic kidney disease (CKD), stage III (moderate) (HCC)    Diabetes 1.5, managed as type 1 ([place]) 02/04/2013   Diabetic peripheral neuropathy associated with type 1 diabetes mellitus ([place]) 11/14/2013   Dysphagia    Dyspnea on exertion 03/21/2015   Exogenous obesity    Gout    Heart attack ([place])    History of nuclear stress test 07/2011   dipyridamole; fixed inferolateral defect, worse at stress than rest; no reversible ischemia; low risk scan    Hyperlipidemia    Hypertension    Hypothyroidism    Insulin dependent diabetes mellitus    Left foot drop    Left main coronary artery disease 03/28/2015   lung ca dx'd 08/2020   Macular pucker, right eye 01/29/2021   Vitrectomy membrane peel right eye 02-12-2021   Memory loss    Obesity (BMI 30-39.9) 11/14/2013   Obstructive sleep apnea 03/21/2015   OSA on CPAP    uses a cpap   Peptic ulcer with hemorrhage 03/28/2015   Peripheral neuropathy    Pneumonia    Rhabdomyolysis    S/P CABG x 2 03/29/2015   LIMA to Diagonal, SVG to OM, EVH via right thigh   Stroke ([place])    L patietal with small scattered lacunar infarcts   Thrombocytopenia ([place]) 03/21/2015   Venous insufficiency    Weakness generalized 03/21/2015   Past Surgical History:  Past Surgical History:  Procedure Laterality Date   BACK SURGERY  2002   lumbosacral. 11 back surgeries total   BRONCHIAL BIOPSY   08/06/2020   Procedure: BRONCHIAL BIOPSIES;  Surgeon: Collene Gobble, MD;  Location: [facility] ENDOSCOPY;  Service: Pulmonary;;   BRONCHIAL BRUSHINGS  08/06/2020   Procedure: BRONCHIAL BRUSHINGS;  Surgeon: Collene Gobble, MD;  Location[facility] ENDOSCOPY;  Service: Pulmonary;;   BRONCHIAL NEEDLE ASPIRATION BIOPSY  08/06/2020   Procedure: BRONCHIAL NEEDLE ASPIRATION BIOPSIES;  Surgeon: Collene Gobble, MD;  Location: [place];  Service: Pulmonary;;   CARDIOVERSION N/A 05/06/2021   Procedure: CARDIOVERSION;  Surgeon: Geralynn Rile, MD;  Location: [place];  Service: Cardiovascular;  Laterality: N/A;   Carotid Doppler  03/2013   bilat bulb/prox ICAs - mild amount of fibrous plaque with no evidence of diameter reduction   CARPAL TUNNEL RELEASE Bilateral 08/09/2014   Procedure: BILATERAL CARPAL TUNNEL RELEASE;  Surgeon: Daryll Brod, MD;  Location: [place];  Service: Orthopedics;  Laterality: Bilateral;  ANESTHESIA:  IV REGIONAL BIL FAB   CHOLECYSTECTOMY     COLONOSCOPY     CORONARY ANGIOPLASTY  10/13/1996   CORONARY ANGIOPLASTY  09/21/1989   emergency PTCA   CORONARY ANGIOPLASTY  10/13/1996   Multi-Link diagonal & OD stenting (Dr. Marella Chimes)   [place]  12/03/1997   disease of mid DX-1 ~50% & in mid PLA & PDA (distal lesions) (Dr. Marella Chimes)    CORONARY ANGIOPLASTY  10/14/1999   progression  of disease distal PLA & PDA; progression of disease prox RCA - moderate (Dr. Marella Chimes)    [place]  04/04/2004   4.0x47mm non-DES (thrombectomy via AngioJet) to RCA for high grade stenosis (Dr. Marella Chimes)   CORONARY ARTERY BYPASS GRAFT N/A 03/29/2015   Procedure: CORONARY ARTERY BYPASS GRAFTING TIMES TWO USING LEFT INTERNAL MAMMARY ARTERY AND RIGHT LEG GREATER SAPHENOUS VEIN HARVESTED ENDOSCOPICALLY.;  Surgeon: Rexene Alberts, MD;  Location: [place];  Service: Open Heart Surgery;  Laterality: N/A;   ESOPHAGOGASTRODUODENOSCOPY N/A  03/27/2015   Procedure: ESOPHAGOGASTRODUODENOSCOPY (EGD);  Surgeon: Clarene Essex, MD;  Location: [facility] ENDOSCOPY;  Service: Endoscopy;  Laterality: N/A;  possible dilation   ESOPHAGOGASTRODUODENOSCOPY N/A 10/05/2020   Procedure: ESOPHAGOGASTRODUODENOSCOPY (EGD);  Surgeon: Arta Silence, MD;  Location: [facility] ENDOSCOPY;  Service: Endoscopy;  Laterality: N/A;   EYE SURGERY Right    macular fold-02/2021   FINE NEEDLE ASPIRATION  08/06/2020   Procedure: FINE NEEDLE ASPIRATION (FNA) LINEAR;  Surgeon: Collene Gobble, MD;  Location: [place] ENDOSCOPY;  Service: Pulmonary;;   IR IMAGING GUIDED PORT INSERTION  10/07/2020   LEFT HEART CATHETERIZATION WITH CORONARY ANGIOGRAM N/A 03/28/2015   Procedure: LEFT HEART CATHETERIZATION WITH CORONARY ANGIOGRAM;  Surgeon: Peter M Martinique, MD;  Location: [facility] CATH LAB;  Service: Cardiovascular;  Laterality: N/A;   SINUS ENDO W/FUSION     TEE WITHOUT CARDIOVERSION N/A 05/06/2021   Procedure: TRANSESOPHAGEAL ECHOCARDIOGRAM (TEE);  Surgeon: Geralynn Rile, MD;  Location: [place];  Service: Cardiovascular;  Laterality: N/A;   TONSILLECTOMY     TRANSTHORACIC ECHOCARDIOGRAM  08/08/2013   EF 55-60%, mild conc hypertrophy, grade 1 diastolic dysfunction; AV with mild stenosis; LA & RA mildly dilated   VIDEO BRONCHOSCOPY WITH ENDOBRONCHIAL NAVIGATION N/A 08/06/2020   Procedure: VIDEO BRONCHOSCOPY WITH ENDOBRONCHIAL NAVIGATION;  Surgeon: Collene Gobble, MD;  Location: [place] ENDOSCOPY;  Service: Pulmonary;  Laterality: N/A;   VIDEO BRONCHOSCOPY WITH ENDOBRONCHIAL ULTRASOUND N/A 08/06/2020   Procedure: VIDEO BRONCHOSCOPY WITH ENDOBRONCHIAL ULTRASOUND;  Surgeon: Collene Gobble, MD;  Location: [place] ENDOSCOPY;  Service: Pulmonary;  Laterality: N/A;   HPI: 80 year old male who presented to the ER today with a 2 to 5-7-day history of worsening weakness, 1 day history of shortness of breath.  Pt fell during transfer with wife and she noticed him breathing hard.  EMS was called.  He was  noted be hypoxic with his 2 L of oxygen.  CXR 10/16: "Increased bilateral perihilar and bibasilar opacities, including  some components of ground-glass opacity, compatible with multifocal  pneumonia versus pulmonary edema." Pt with a history of stage IV lung cancer, type 1 diabetes, obesity, chronic hypoxic respiratory failure, combined systolic diastolic heart failure, atrial fibrillation, chronic anticoagulation.   Subjective: Pt awake, alert, pleasant, participative.  Wife present for evaluation    Assessment / Plan / Recommendation  CHL IP CLINICAL IMPRESSIONS 09/29/2021  Clinical Impression Pt seen today for FEES in lieu of planned MBSS.  Pt's oxygen requirements (15L [place]) make transportation to radiology complicated and pt was agreeable to bedside FEES to assess swallow function.    Pt presents with grossly normal oropharyngeal swallow function.  There was no penetration or aspiration of any consistencies trialed, including serial straw sips of thin liuqid dairy.  There was trace-no residue with thin liuqids and no appreciable residue with puree or solid textures.  There was a single instance of premature spillage to the pyriform sinus with thin liquid dairy, but swallow initiation was otherwise timely.  Recommend resuming regular texture diet with thin liuqids.  SLP will sign off.  Pt has no further needs at this time.  SLP Visit Diagnosis --  Attention and concentration deficit following --  Frontal lobe and executive function deficit following --  Impact on safety and function No limitations      CHL IP TREATMENT RECOMMENDATION 09/29/2021  Treatment Recommendations No treatment recommended at this time     Prognosis 09/29/2021  Prognosis for Safe Diet Advancement (No Data)  Barriers to Reach Goals --  Barriers/Prognosis Comment --    CHL IP DIET RECOMMENDATION 09/29/2021  SLP Diet Recommendations Regular solids;Thin liquid  Liquid Administration via --  Medication  Administration --  Compensations Slow rate;Small sips/bites  Postural Changes Seated upright at 90 degrees      CHL IP OTHER RECOMMENDATIONS 09/29/2021  Recommended Consults --  Oral Care Recommendations Oral care BID  Other Recommendations --      CHL IP FOLLOW UP RECOMMENDATIONS 09/29/2021  Follow up Recommendations None      CHL IP FREQUENCY AND DURATION 09/28/2021  Speech Therapy Frequency (ACUTE ONLY) (No Data)  Treatment Duration --           CHL IP ORAL PHASE 09/29/2021  Oral Phase WFL  Oral - Pudding Teaspoon --  Oral - Pudding Cup --  Oral - Honey Teaspoon --  Oral - Honey Cup --  Oral - Nectar Teaspoon --  Oral - Nectar Cup --  Oral - Nectar Straw --  Oral - Thin Teaspoon --  Oral - Thin Cup --  Oral - Thin Straw --  Oral - Puree --  Oral - Mech Soft --  Oral - Regular --  Oral - Multi-Consistency --  Oral - Pill --  Oral Phase - Comment --    CHL IP PHARYNGEAL PHASE 09/29/2021  Pharyngeal Phase WFL  Pharyngeal- Pudding Teaspoon --  Pharyngeal --  Pharyngeal- Pudding Cup --  Pharyngeal --  Pharyngeal- Honey Teaspoon --  Pharyngeal --  Pharyngeal- Honey Cup --  Pharyngeal --  Pharyngeal- Nectar Teaspoon --  Pharyngeal --  Pharyngeal- Nectar Cup --  Pharyngeal --  Pharyngeal- Nectar Straw --  Pharyngeal --  Pharyngeal- Thin Teaspoon --  Pharyngeal --  Pharyngeal- Thin Cup --  Pharyngeal --  Pharyngeal- Thin Straw WFL;Delayed swallow initiation-pyriform sinuses  Pharyngeal Material does not enter airway  Pharyngeal- Puree WFL  Pharyngeal Material does not enter airway  Pharyngeal- Mechanical Soft --  Pharyngeal --  Pharyngeal- Regular WFL  Pharyngeal Material does not enter airway  Pharyngeal- Multi-consistency --  Pharyngeal --  Pharyngeal- Pill --  Pharyngeal --  Pharyngeal Comment --     CHL IP CERVICAL ESOPHAGEAL PHASE 09/29/2021  Cervical Esophageal Phase WFL  Pudding Teaspoon --  Pudding Cup --  Honey Teaspoon --  Honey Cup  --  Nectar Teaspoon --  Nectar Cup --  Nectar Straw --  Thin Teaspoon --  Thin Cup --  Thin Straw --  Puree --  Mechanical Soft --  Regular --  Multi-consistency --  Pill --  Cervical Esophageal Comment --     Celedonio Savage, MA, [place] Office: [phone]  09/29/2021, 11:19 AM

## 2021-09-29 NOTE — Progress Notes (Signed)
MD requested RT to remove pt from BiPAP. RT at bedside this AM and removed pt from BiPAP and placed on 10L [place]. Pt tolerated well with SVS. RT will continue to monitor pt.

## 2021-09-29 NOTE — Evaluation (Signed)
Occupational Therapy Evaluation Patient Details Name: Randall Wilkins MRN: [id number] DOB: 26-Jan-1941 Today's Date: 09/29/2021   History of Present Illness 80 year old male presents to the ER via EMS 09/26/21 with a 2 to 5-7-day history of worsening weakness resulting in fall when trying to when transferring from wheelchair back to bed. At baseline pt was ambulating with RW, Pt with 1 day history of shortness of breath. On arrival to the ER, sats were 88% on 6 L.  He dropped to 52% on room air. Wife states pt was scheduled for TEE/cardioversion due to afib next week. Admitted for treatment of Acute on chronic combined systolic and diastolic CHF. Pt requiring bipap 09/28/21  PMH: stage IV lung cancer, type 1 diabetes, obesity, chronic hypoxic respiratory failure, combined systolic diastolic heart failure, atrial fibrillation, chronic anticoagulation   Clinical Impression   Pt walks with a rollator household distances and uses a transport chair outside the home. His wife assists with LB ADL, drying off after shower, shower transfers and all IADL. Pt present with impaired cognition (likely at his baseline) generalized weakness, impaired standing balance and decreased activity tolerance. Pt with Sp02 down to 86% on 15L 02, but recovered to 90% with cues to purse lip breathe. Pt requires up to min assist for bed mobility and OOB with RW from elevated surface. He needs set up to max assist for ADL. Recommending HHOT upon discharge.      Recommendations for follow up therapy are one component of a multi-disciplinary discharge planning process, led by the attending physician.  Recommendations may be updated based on patient status, additional functional criteria and insurance authorization.   Follow Up Recommendations  Home health OT    Equipment Recommendations  None recommended by OT    Recommendations for Other Services       Precautions / Restrictions Precautions Precautions: Fall Precaution  Comments: fall prior to hospitalization      Mobility Bed Mobility Overal bed mobility: Needs Assistance Bed Mobility: Supine to Sit     Supine to sit: Min assist     General bed mobility comments: pulled trunk up on therapist's hand, HOB up, increased time    Transfers Overall transfer level: Needs assistance Equipment used: Rolling walker (2 wheeled) Transfers: Sit to/from [facility] Sit to Stand: Min assist;From elevated surface Stand pivot transfers: Min assist;From elevated surface       General transfer comment: assist to rise and steady from elevated bed x 1 and chair with 3 quilts to build up surface    Balance Overall balance assessment: Needs assistance   Sitting balance-Leahy Scale: Fair     Standing balance support: Bilateral upper extremity supported;During functional activity Standing balance-Leahy Scale: Poor Standing balance comment: requires RW and min assist for dynamic balance and min guard for static                           ADL either performed or assessed with clinical judgement   ADL Overall ADL's : Needs assistance/impaired Eating/Feeding: Set up;Sitting   Grooming: Set up;Sitting   Upper Body Bathing: Minimal assistance;Sitting   Lower Body Bathing: Moderate assistance;Sit to/from stand   Upper Body Dressing : Set up;Sitting   Lower Body Dressing: Maximal assistance;Sit to/from stand   Toilet Transfer: Minimal assistance;Stand-pivot;RW;BSC   Toileting- Clothing Manipulation and Hygiene: Maximal assistance;Sit to/from stand       Functional mobility during ADLs: Minimal assistance;Rolling walker  Vision Ability to See in Adequate Light: 0 Adequate Patient Visual Report: No change from baseline       Perception     Praxis      Pertinent Vitals/Pain Pain Assessment: Faces Faces Pain Scale: No hurt     Hand Dominance Right   Extremity/Trunk Assessment Upper Extremity Assessment Upper  Extremity Assessment: Generalized weakness   Lower Extremity Assessment Lower Extremity Assessment: Defer to PT evaluation   Cervical / Trunk Assessment Cervical / Trunk Assessment: Kyphotic   Communication Communication Communication: HOH   Cognition Arousal/Alertness: Awake/alert Behavior During Therapy: WFL for tasks assessed/performed Overall Cognitive Status: Impaired/Different from baseline Area of Impairment: Memory;Following commands                     Memory: Decreased short-term memory Following Commands: Follows one step commands with increased time;Follows multi-step commands with increased time       General Comments: pt wife reports confusion at baseline, pt disoriented to time and requires increased time for processing and command follow, gets worse when hospitalizaed   General Comments       Exercises     Shoulder Instructions      Home Living Family/patient expects to be discharged to:: Private residence Living Arrangements: Spouse/significant other Available Help at Discharge: Family;Available 24 hours/day Type of Home: House Home Access: Ramped entrance     Home Layout: One level     Bathroom Shower/Tub: Occupational psychologist: Handicapped height     Home Equipment: Grab bars - tub/shower;Walker - 2 wheels;Hand held Tourist information centre manager - 4 wheels;Electric scooter;Shower seat - built in;Grab bars - toilet;Transport chair;Adaptive equipment Adaptive Equipment: Reacher;Long-handled sponge        Prior Functioning/Environment Level of Independence: Needs assistance  Gait / Transfers Assistance Needed: Pt reports using RW for short ambulation distances including up and down ramp to car. Wife reports using transport chair when out of house. ADL's / Homemaking Assistance Needed: Pt's wife assists with shower transfers, drying off back side, lower body bathing/dressing and all IADL. Communication / Swallowing Assistance Needed:  HOH          OT Problem List: Decreased strength;Decreased activity tolerance;Impaired balance (sitting and/or standing);Decreased cognition;Decreased safety awareness;Decreased knowledge of use of DME or AE;Cardiopulmonary status limiting activity      OT Treatment/Interventions: Self-care/ADL training;DME and/or AE instruction;Therapeutic activities;Patient/family education;Balance training    OT Goals(Current goals can be found in the care plan section) Acute Rehab OT Goals Patient Stated Goal: be able to walk again OT Goal Formulation: With patient Time For Goal Achievement: 10/13/21 Potential to Achieve Goals: Good ADL Goals Pt Will Perform Grooming: with supervision;sitting (at sink) Pt Will Perform Upper Body Bathing: with set-up;sitting Pt Will Perform Upper Body Dressing: with set-up;sitting Pt Will Transfer to Toilet: with supervision;ambulating;bedside commode (over toilet) Pt Will Perform Toileting - Clothing Manipulation and hygiene: with min assist;sit to/from stand  OT Frequency: Min 2X/week   Barriers to D/C:            Co-evaluation              AM-PAC OT "6 Clicks" Daily Activity     Outcome Measure Help from another person eating meals?: None Help from another person taking care of personal grooming?: A Little Help from another person toileting, which includes using toliet, bedpan, or urinal?: A Lot Help from another person bathing (including washing, rinsing, drying)?: A Lot Help from another person to put on and taking  off regular upper body clothing?: A Little Help from another person to put on and taking off regular lower body clothing?: A Lot 6 Click Score: 16   End of Session Equipment Utilized During Treatment: Rolling walker;Gait belt;Oxygen (15L) Nurse Communication: Mobility status  Activity Tolerance: Patient tolerated treatment well Patient left: in chair;with call bell/phone within reach;with chair alarm set  OT Visit Diagnosis:  Unsteadiness on feet (R26.81);Other abnormalities of gait and mobility (R26.89);Muscle weakness (generalized) (M62.81);Other symptoms and signs involving cognitive function                Time: 0086-7619 OT Time Calculation (min): 30 min Charges:  OT General Charges $OT Visit: 1 Visit OT Evaluation $OT Eval Moderate Complexity: 1 Mod OT Treatments $Therapeutic Activity: 8-22 mins  Nestor Lewandowsky, OTR/L Acute Rehabilitation Services Pager: [phone] Office: [phone]   Malka So 09/29/2021, 1:11 PM

## 2021-09-29 NOTE — Progress Notes (Addendum)
ANTICOAGULATION CONSULT NOTE - Initial Consult  Pharmacy Consult for heparin  Indication: atrial fibrillation  Allergies  Allergen Reactions   Actos [Pioglitazone] Swelling   Spironolactone Itching   Metformin And Related Nausea Only    3.16.2022 pt is currently taking this medication at home   Niaspan [Niacin Er] Itching and Rash   Wound Dressing Adhesive Rash    Patient Measurements: Height: 5\' 7"  (170.2 cm) Weight: 97.5 kg (214 lb 15.2 oz) IBW/kg (Calculated) : 66.1 Heparin Dosing Weight: 87kg  Vital Signs: Temp: 97.7 F (36.5 C) (10/17 0343) Temp Source: Oral (10/17 0343) BP: 120/58 (10/17 0343) Pulse Rate: 64 (10/17 0450)  Labs: Recent Labs    09/27/21 0020 09/27/21 0156 09/27/21 0407 09/28/21 0449 09/28/21 0930 09/28/21 1658 09/29/21 0622  HGB 8.7*  --  8.8* 9.8*  --   --   --   HCT 29.4*  --  28.8* 32.4*  --   --   --   PLT 121*  --  96* 102*  --   --   --   APTT 48*  --   --   --   --   --  55*  LABPROT 20.4*  --   --   --   --   --   --   INR 1.8*  --   --   --   --   --   --   HEPARINUNFRC  --   --   --   --   --   --  >1.10*  CREATININE 2.22*  --  2.27* 1.99* 1.86*  --   --   CKTOTAL  --   --   --  38*  --   --   --   TROPONINIHS 76* 83*  --   --   --  68*  --      Estimated Creatinine Clearance: 35.3 mL/min (A) (by C-G formula based on SCr of 1.86 mg/dL (H)).   Medical History: Past Medical History:  Diagnosis Date   Aortic stenosis, mild 11/14/2013   Aortic valve sclerosis 03/29/2015   Bilateral leg edema 05/21/2014   CAD (coronary artery disease)    Chronic diastolic congestive heart failure (HCC)    Chronic kidney disease (CKD), stage III (moderate) (HCC)    Diabetes 1.5, managed as type 1 ([place]) 02/04/2013   Diabetic peripheral neuropathy associated with type 1 diabetes mellitus ([place]) 11/14/2013   Dysphagia    Dyspnea on exertion 03/21/2015   Exogenous obesity    Gout    Heart attack ([place])    History of nuclear stress test 07/2011    dipyridamole; fixed inferolateral defect, worse at stress than rest; no reversible ischemia; low risk scan    Hyperlipidemia    Hypertension    Hypothyroidism    Insulin dependent diabetes mellitus    Left foot drop    Left main coronary artery disease 03/28/2015   lung ca dx'd 08/2020   Macular pucker, right eye 01/29/2021   Vitrectomy membrane peel right eye 02-12-2021   Memory loss    Obesity (BMI 30-39.9) 11/14/2013   Obstructive sleep apnea 03/21/2015   OSA on CPAP    uses a cpap   Peptic ulcer with hemorrhage 03/28/2015   Peripheral neuropathy    Pneumonia    Rhabdomyolysis    S/P CABG x 2 03/29/2015   LIMA to Diagonal, SVG to OM, EVH via right thigh   Stroke ([place])    L patietal with small  scattered lacunar infarcts   Thrombocytopenia ([place]) 03/21/2015   Venous insufficiency    Weakness generalized 03/21/2015    Assessment: 80 year old male with history of afib on apixaban prior to admit. Patient having swallowing difficulties this evening so new orders received to bridge with heparin until able to restart oral anticoagulation.   Will need to monitor aptt's until they correlate with heparin levels.  Heparin level > 1.1 is not correlating with aPTT 55, which is subtherapeutic on 1200 units/hr. CBC stable.   Goal of Therapy:  Heparin level 0.3-0.7 units/ml aPTT 66-102 seconds Monitor platelets by anticoagulation protocol: Yes   Plan:  Increase heparin to 1350 units/hr F/u 8hr HL  F/u aPTT until correlates with heparin level  Monitor daily aPTT, HL, CBC/plt Monitor for signs/symptoms of bleeding   ADDENDUM 12:15: Pharmacy consulted to switch heparin to apixaban, patient was able to swallow morning meds.  Will stop heparin and restart home apixaban 2.5mg  BID given renal function and age.    Benetta Spar, PharmD, BCPS, BCCP Clinical Pharmacist  Please check AMION for all [place] phone numbers After 10:00 PM, call [place] [phone]

## 2021-09-29 NOTE — Progress Notes (Addendum)
Subjective:   On bipap no chest pain good diuresis    Objective:  Vitals:   09/29/21 0100 09/29/21 0343 09/29/21 0450 09/29/21 0828  BP:  (!) 120/58    Pulse: 70 (!) 58 64 (!) 53  Resp: (!) 28 (!) 25 (!) 23 (!) 22  Temp:  97.7 F (36.5 C)    TempSrc:  Oral    SpO2: 100% 99% 97% 93%  Weight:  97.5 kg    Height:        Intake/Output from previous day:  Intake/Output Summary (Last 24 hours) at 09/29/2021 0918 Last data filed at 09/29/2021 0647 Gross per 24 hour  Intake --  Output 2225 ml  Net -2225 ml    Physical Exam:  Chronically ill male Bilateral rhonchi Right subclavian tunneled port Plus 2 LE edema with chronic stasis changes On Bipap Loud AS murmur   Lab Results: Basic Metabolic Panel: pending for 10/17 Recent Labs    09/28/21 0449 09/28/21 0930 09/29/21 0703  NA 138 139  --   K 3.7 3.6  --   CL 103 102  --   CO2 25 26  --   GLUCOSE 105* 199*  --   BUN 35* 35*  --   CREATININE 1.99* 1.86*  --   CALCIUM 8.5* 8.5*  --   MG 2.0  --  2.3  PHOS 4.3  --   --    Liver Function Tests: Recent Labs    09/27/21 0020 09/27/21 0407 09/28/21 0449  AST 17 15  --   ALT 11 8  --   ALKPHOS 92 89  --   BILITOT 0.9 0.7  --   PROT 5.8* 5.6*  --   ALBUMIN 2.8* 2.6* 2.7*   No results for input(s): LIPASE, AMYLASE in the last 72 hours. CBC: Recent Labs    09/27/21 0020 09/27/21 0407 09/28/21 0449 09/29/21 0622  WBC 8.1 6.9 8.2 9.8  NEUTROABS 6.3 4.7  --   --   HGB 8.7* 8.8* 9.8* 9.7*  HCT 29.4* 28.8* 32.4* 31.6*  MCV 96.4 95.0 94.5 93.5  PLT 121* 96* 102* 110*   Cardiac Enzymes: Recent Labs    09/28/21 0449  CKTOTAL 38*   BNP    Component Value Date/Time   BNP 1,361.1 (H) 09/28/2021 0449    Imaging: DG CHEST PORT 1 VIEW  Result Date: 09/28/2021 CLINICAL DATA:  Dyspnea.  History of CHF EXAM: PORTABLE CHEST 1 VIEW COMPARISON:  09/28/2021 FINDINGS: Right-sided chest port remains in place. Cardiomegaly. Prior median sternotomy.  Bilateral patchy airspace opacities persist, overall similar to the previous radiograph. Small right pleural effusion. No pneumothorax. IMPRESSION: Bilateral patchy airspace opacities persist, overall similar to previous radiograph. Electronically Signed   By: Davina Poke D.O.   On: 09/28/2021 15:28   DG CHEST PORT 1 VIEW  Result Date: 09/28/2021 CLINICAL DATA:  Shortness of breath EXAM: PORTABLE CHEST 1 VIEW COMPARISON:  Chest x-ray dated 09/26/2021 FINDINGS: Increased bilateral perihilar and bibasilar opacities, some components suggesting dense consolidation and some components of ground-glass opacity. Probable small RIGHT pleural effusion. No pneumothorax is seen. Stable cardiomegaly. RIGHT chest wall Port-A-Cath is stable in position. Median sternotomy wires in place. IMPRESSION: 1. Increased bilateral perihilar and bibasilar opacities, including some components of ground-glass opacity, compatible with multifocal pneumonia versus pulmonary edema. 2. Probable small RIGHT pleural effusion. Electronically Signed   By: Franki Cabot M.D.   On: 09/28/2021 07:33    Cardiac Studies:  ECG: afib  PVC nonspecific ST changes, repeat ECG pending   Telemetry:  afib rates 80's vs SR w/ long PR interval   Echo: pending for 10/17 TEE 05/06/21 EF 55-60% moderate AS mean gradient 24 mmHg   Medications:    amiodarone  200 mg Oral BID   arformoterol  15 mcg Nebulization BID   budesonide (PULMICORT) nebulizer solution  0.5 mg Nebulization BID   Chlorhexidine Gluconate Cloth  6 each Topical Daily   furosemide  60 mg Intravenous Daily   insulin aspart  0-15 Units Subcutaneous TID WC   insulin aspart  0-5 Units Subcutaneous QHS   [START ON 10/01/2021] levothyroxine  44 mcg Intravenous Daily   rosuvastatin  20 mg Oral Daily   sodium chloride flush  10 mL Intravenous Q12H   sodium chloride flush  10-40 mL Intracatheter Q12H   sotorasib  960 mg Oral Daily   tamsulosin  0.4 mg Oral QHS   umeclidinium bromide   1 puff Inhalation Daily      ceFEPime (MAXIPIME) IV 2 g (09/28/21 1649)   heparin 1,200 Units/hr (09/28/21 2117)   metronidazole 500 mg (09/28/21 2114)    Assessment/Plan:  PAF: last TEE/DCCV 04/2021, on renal dose eliquis Scheduled for [facility] 10/19 Current respiratory status not adequate for procedure and sedation continue amiodarone has been changed to heparin Has had continues anticoagulation so if respiratory status improves does not need TEE  CAD: s/p CABG 2016 w/ subsequent PTCA PLA/PDA. No ischemic sx. Only AAT Tikosyn or amio >> amio for now.   AS: at least moderate, echo pending, not a candidate for anything to fix it w/ poor functional capacity and stage 4 lung CA   Lung Cancer: has had chemo, now on oral Lumakras (sotorasib),   CXR suggests pneumonia on Bipap continue Maxipime and metronidazole  CRF:  has fluid on CXR and in LE. PTA on Lasix 80 mg BID. Currently on Lasix 60 mg IV qd. ++volume overload, I/O net neg 4.7 L, wt no change last 48 hr, Renal function today pending  Prognosis seems somewhat guarded   Jenkins Rouge MD [facility]

## 2021-09-29 NOTE — Progress Notes (Signed)
PROGRESS NOTE  Randall Wilkins BWI:[id number] DOB: April 11, 1941   PCP: [name], MD  Patient is from: Home.  Lives with his wife.  Uses walker at baseline but lately has been using wheelchair.  DOA: 09/26/2021 LOS: 2  Chief complaints:  Shortness of breath    Brief Narrative / Interim history: 80 year old M with PMH of combined CHF, stage IV lung cancer, hronic hypoxic RF on 2 L, PAH, A. fib/flutter on Eliquis, CAD, DM-1, cobesity, hard of hearing, recent "UTI" and ambulatory dysfunction presenting with progressive weakness and acute shortness of breath.  He is admitted with acute on chronic hypoxic RF due to CHF exacerbation.  He was hypoxic to 52% on RA requiring up to 6 L to recover.  BNP elevated to 1200.  CXR with pulmonary edema.  Troponin 76>> 83.  EKG rate controlled A. fib with occasional PVCs.  Started on IV Lasix.  Patient was to have cardioversion next week.   The next day, cardiology and palliative medicine consulted.  CODE STATUS changed to DNR/DNR.  High side respiratory distress, increased oxygen requirement and worsening chest x-ray despite antibiotics and excellent urine output requiring BiPAP.  Antibiotics broadened to IV cefepime and Flagyl.  Remains on IV Lasix  Subjective: Seen and examined earlier this morning.  He spiked fever to 102.3 last night.  He spent the night on BiPAP.  Feels well this morning.  He is asking when he can come off BiPAP.  He denies chest pain, shortness of breath, GI or UTI symptoms.  Patient's wife at bedside.  Objective: Vitals:   09/29/21 0100 09/29/21 0343 09/29/21 0450 09/29/21 0828  BP:  (!) 120/58    Pulse: 70 (!) 58 64 (!) 53  Resp: (!) 28 (!) 25 (!) 23 (!) 22  Temp:  97.7 F (36.5 C)    TempSrc:  Oral    SpO2: 100% 99% 97% 93%  Weight:  97.5 kg    Height:        Intake/Output Summary (Last 24 hours) at 09/29/2021 1135 Last data filed at 09/29/2021 0647 Gross per 24 hour  Intake --  Output 1375 ml  Net -1375 ml    Filed Weights   09/27/21 0007 09/27/21 0547 09/29/21 0343  Weight: 100.7 kg 97.5 kg 97.5 kg    Examination:  GENERAL: No apparent distress.  Nontoxic. HEENT: MMM.  Vision and hearing grossly intact.  NECK: Supple.  Notable JVD. RESP: 97% on BiPAP.  No IWOB.  Fair aeration bilaterally.  Bibasilar crackles. CVS:  RRR. Heart sounds normal.  ABD/GI/GU: BS+. Abd soft, NTND.  MSK/EXT:  Moves extremities. No apparent deformity.  1+ edema in BLE. SKIN: no apparent skin lesion or wound NEURO: Awake and alert. Oriented appropriately.  No apparent focal neuro deficit. PSYCH: Calm. Normal affect.    Procedures:  None  Microbiology summarized: ULAGT-36 and influenza PCR nonreactive. Blood culture NGTD. Urine culture pending. Assessment & Plan: Acute on chronic hypoxic respiratory failure-on 2 L at baseline.  Has increased work of breathing, tachypnea and increased oxygen requirement requiring BiPAP despite IV antibiotics and excellent urine output with diuretics.  Repeat CXR with increased bilateral opacity.  Blood gas reassuring.  He also spiked fever to 102.3.  Blood cultures NGTD.  Concern about aspiration but cleared by SLP.  He is also on relatively high dose of amiodarone.  -Intermittent BiPAP as needed -Broadened antibiotic to cefepime and Flagyl on 10/16 -Added Pulmicort, Brovana and as needed DuoNeb on 10/16 -Aspiration precaution -Cleared for  regular diet by SLP -Encourage incentive spirometry/OOB/PT/OT -Diuretics per cardiology  Acute on chronic combined CHF/severe PAH: TTE in 04/2021 with LVEF of 45 to 50%, mod AS, RVSP of 79 mmHg and mild to moderate TVR.  He presents with generalized weakness and acute dyspnea.  Good compliance with his diuretics, fluid and sodium per his wife.  BNP 1200>> 1400.  About 3 L UOP/24 hours.  Net -5 L so far.  Renal function improving. -Appreciate help by cardiology-IV Lasix 60 mg daily -Continue holding Imdur and lisinopril given soft blood  pressure -Follow echocardiogram -Monitor fluid status, renal functions and electrolytes. -Sodium and fluid restriction -Intermittent BiPAP as above.   Elevated troponin/history of CABG in 2016: Likely demand ischemia due to CHF and hypoxia.  No significant delta.  No chest pain.  EKG without ischemic finding.  First degree AVB/prolonged QT-patient is on amiodarone and Lasix. -Defer those to cardiology   IDDM-1 with neuropathy and hyperglycemia: A1c 6.6% on 09/09/2021. Recent Labs  Lab 09/28/21 1129 09/28/21 1551 09/28/21 1942 09/29/21 0610 09/29/21 0625  GLUCAP 195* 191* 202* 141* 139*  -Continue current insulin regimen  AKI on CKD-3B/azotemia-seems cardiorenal.  Improving with IV diuretics. Recent Labs    06/11/21 0937 06/17/21 1254 07/22/21 0930 08/20/21 0922 09/08/21 0935 09/27/21 0020 09/27/21 0407 09/28/21 0449 09/28/21 0930 09/29/21 0703  BUN 43* 41* 25* 21 26* 39* 38* 35* 35* 36*  CREATININE 1.89* 1.73* 1.67* 1.63* 1.83* 2.22* 2.27* 1.99* 1.86* 1.76*  -Continue monitoring -Further work-up if worse or no improvement   Persistent A. fib/flutter-rate controlled.  On amiodarone and Eliquis.  Reports good compliance with Eliquis.  Plan was for possible cardioversion next week but he got admitted -Continue amiodarone and Eliquis -Plan for DCCV next week  Possible UTI: Reportedly had increased frequency when he presented to PCP.  He was started on high-dose Keflex for his renal function.  UA negative.  Seems urine culture was not sent yet. -Antibiotics as above  Anemia of chronic disease-Baseline Hgb 9-10.  Stable. Recent Labs    05/20/21 0947 06/17/21 1254 07/22/21 0930 08/20/21 0922 09/08/21 0935 09/26/21 2354 09/27/21 0020 09/27/21 0407 09/28/21 0449 09/29/21 0622  HGB 10.5* 10.1* 9.7* 9.6* 9.5* 11.6* 8.7* 8.8* 9.8* 9.7*  -Continue monitoring  Generalized weakness/physical deconditioning/chronic LLE weakness-uses walker at baseline.  Lately using  wheelchair -PT/OT consult  Essential hypertension: Normotensive this morning. -IV Lasix as above -Hold Imdur, amlodipine and lisinopril given soft blood pressures   Adenocarcinoma of right lung, stage 4  -Continue oral Lumakras. Told wife to bring this med to hospital.  BPH without LUTS -Continue Flomax  Hypothyroidism: -Continue home Synthroid  Goal of care-patient with significant comorbidities as above.  Poor long-term prognosis.  Not sure if CPR and intubation are to his best interest.  -Appreciate input by palliative care.  Agree with DNR/DNI.   Body mass index is 33.67 kg/m.         DVT prophylaxis:  SCDs Start: 09/27/21 1017  Code Status: Full code Family Communication: Updated patient's wife at bedside. Level of care: Telemetry Medical Status is: Inpatient  Remains inpatient appropriate because: Due to acute on chronic respiratory failure with increased work of breathing requiring BiPAP, tachypnea, increased oxygen requirement, fluid overload, IV medications and planned inpatient procedure by cardiology   Consultants:  Cardiology Palliative medicine   Sch Meds:  Scheduled Meds:  amiodarone  200 mg Oral BID   arformoterol  15 mcg Nebulization BID   budesonide (PULMICORT) nebulizer solution  0.5  mg Nebulization BID   Chlorhexidine Gluconate Cloth  6 each Topical Daily   furosemide  60 mg Intravenous Daily   insulin aspart  0-15 Units Subcutaneous TID WC   insulin aspart  0-5 Units Subcutaneous QHS   [START ON 10/01/2021] levothyroxine  44 mcg Intravenous Daily   rosuvastatin  20 mg Oral Daily   sodium chloride flush  10 mL Intravenous Q12H   sodium chloride flush  10-40 mL Intracatheter Q12H   sotorasib  960 mg Oral Daily   tamsulosin  0.4 mg Oral QHS   umeclidinium bromide  1 puff Inhalation Daily   Continuous Infusions:  ceFEPime (MAXIPIME) IV 2 g (09/29/21 1129)   heparin 1,350 Units/hr (09/29/21 0930)   metronidazole 500 mg (09/29/21 0938)    PRN Meds:.acetaminophen **OR** acetaminophen, ipratropium-albuterol, sodium chloride flush, sodium chloride flush  Antimicrobials: Anti-infectives (From admission, onward)    Start     Dose/Rate Route Frequency Ordered Stop   09/28/21 1800  metroNIDAZOLE (FLAGYL) IVPB 500 mg        500 mg 100 mL/hr over 60 Minutes Intravenous Every 12 hours 09/28/21 1520     09/28/21 1630  ceFEPIme (MAXIPIME) 2 g in sodium chloride 0.9 % 100 mL IVPB        2 g 200 mL/hr over 30 Minutes Intravenous Every 12 hours 09/28/21 1537     09/27/21 0930  cefTRIAXone (ROCEPHIN) 1 g in sodium chloride 0.9 % 100 mL IVPB  Status:  Discontinued        1 g 200 mL/hr over 30 Minutes Intravenous Every 24 hours 09/27/21 0839 09/28/21 1520        I have personally reviewed the following labs and images: CBC: Recent Labs  Lab 09/26/21 2354 09/27/21 0020 09/27/21 0407 09/28/21 0449 09/29/21 0622  WBC  --  8.1 6.9 8.2 9.8  NEUTROABS  --  6.3 4.7  --   --   HGB 11.6* 8.7* 8.8* 9.8* 9.7*  HCT 34.0* 29.4* 28.8* 32.4* 31.6*  MCV  --  96.4 95.0 94.5 93.5  PLT  --  121* 96* 102* 110*   BMP &GFR Recent Labs  Lab 09/27/21 0020 09/27/21 0407 09/28/21 0449 09/28/21 0930 09/29/21 0703  NA 137 137 138 139 136  K 4.2 4.3 3.7 3.6 3.7  CL 103 105 103 102 102  CO2 23 24 25 26 24   GLUCOSE 145* 169* 105* 199* 141*  BUN 39* 38* 35* 35* 36*  CREATININE 2.22* 2.27* 1.99* 1.86* 1.76*  CALCIUM 8.4* 8.4* 8.5* 8.5* 8.6*  MG  --  2.2 2.0  --  2.3  PHOS  --   --  4.3  --  4.2   Estimated Creatinine Clearance: 37.3 mL/min (A) (by C-G formula based on SCr of 1.76 mg/dL (H)). Liver & Pancreas: Recent Labs  Lab 09/27/21 0020 09/27/21 0407 09/28/21 0449 09/29/21 0703  AST 17 15  --   --   ALT 11 8  --   --   ALKPHOS 92 89  --   --   BILITOT 0.9 0.7  --   --   PROT 5.8* 5.6*  --   --   ALBUMIN 2.8* 2.6* 2.7* 2.6*   No results for input(s): LIPASE, AMYLASE in the last 168 hours. No results for input(s): AMMONIA in  the last 168 hours. Diabetic: No results for input(s): HGBA1C in the last 72 hours. Recent Labs  Lab 09/28/21 1129 09/28/21 1551 09/28/21 1942 09/29/21 0610 09/29/21 8242  GLUCAP  195* 191* 202* 141* 139*   Cardiac Enzymes: Recent Labs  Lab 09/28/21 0449  CKTOTAL 38*   No results for input(s): PROBNP in the last 8760 hours. Coagulation Profile: Recent Labs  Lab 09/27/21 0020  INR 1.8*   Thyroid Function Tests: No results for input(s): TSH, T4TOTAL, FREET4, T3FREE, THYROIDAB in the last 72 hours. Lipid Profile: No results for input(s): CHOL, HDL, LDLCALC, TRIG, CHOLHDL, LDLDIRECT in the last 72 hours. Anemia Panel: No results for input(s): VITAMINB12, FOLATE, FERRITIN, TIBC, IRON, RETICCTPCT in the last 72 hours. Urine analysis:    Component Value Date/Time   COLORURINE YELLOW 09/27/2021 0021   APPEARANCEUR CLEAR 09/27/2021 0021   LABSPEC 1.011 09/27/2021 0021   PHURINE 5.0 09/27/2021 0021   GLUCOSEU NEGATIVE 09/27/2021 0021   GLUCOSEU NEGATIVE 08/29/2019 0959   HGBUR NEGATIVE 09/27/2021 0021   BILIRUBINUR NEGATIVE 09/27/2021 0021   KETONESUR NEGATIVE 09/27/2021 0021   PROTEINUR 30 (A) 09/27/2021 0021   UROBILINOGEN 0.2 08/29/2019 0959   NITRITE NEGATIVE 09/27/2021 0021   LEUKOCYTESUR NEGATIVE 09/27/2021 0021   Sepsis Labs: Invalid input(s): PROCALCITONIN, [place]  Microbiology: Recent Results (from the past 240 hour(s))  Blood Culture (routine x 2)     Status: None (Preliminary result)   Collection Time: 09/27/21 12:20 AM   Specimen: BLOOD  Result Value Ref Range Status   Specimen Description BLOOD PORTA CATH  Final   Special Requests   Final    BOTTLES DRAWN AEROBIC AND ANAEROBIC Blood Culture adequate volume   Culture   Final    NO GROWTH 2 DAYS Performed at [facility], [address] [place], [place] [address]    Report Status PENDING  Incomplete  Resp Panel by RT-PCR (Flu A&B, Covid) Peripheral     Status: None   Collection Time:  09/27/21 12:25 AM   Specimen: Peripheral; Nasopharyngeal(NP) swabs in vial transport medium  Result Value Ref Range Status   SARS Coronavirus 2 by RT PCR NEGATIVE NEGATIVE Final    Comment: (NOTE) SARS-CoV-2 target nucleic acids are NOT DETECTED.  The SARS-CoV-2 RNA is generally detectable in upper respiratory specimens during the acute phase of infection. The lowest concentration of SARS-CoV-2 viral copies this assay can detect is 138 copies/mL. A negative result does not preclude SARS-Cov-2 infection and should not be used as the sole basis for treatment or other patient management decisions. A negative result may occur with  improper specimen collection/handling, submission of specimen other than nasopharyngeal swab, presence of viral mutation(s) within the areas targeted by this assay, and inadequate number of viral copies(<138 copies/mL). A negative result must be combined with clinical observations, patient history, and epidemiological information. The expected result is Negative.  Fact Sheet for Patients:  EntrepreneurPulse.com.au  Fact Sheet for Healthcare Providers:  IncredibleEmployment.be  This test is no t yet approved or cleared by the [place] FDA and  has been authorized for detection and/or diagnosis of SARS-CoV-2 by FDA under an Emergency Use Authorization (EUA). This EUA will remain  in effect (meaning this test can be used) for the duration of the COVID-19 declaration under Section 564(b)(1) of the Act, 21 U.S.C.section 360bbb-3(b)(1), unless the authorization is terminated  or revoked sooner.       Influenza A by PCR NEGATIVE NEGATIVE Final   Influenza B by PCR NEGATIVE NEGATIVE Final    Comment: (NOTE) The Xpert Xpress SARS-CoV-2/FLU/RSV plus assay is intended as an aid in the diagnosis of influenza from Nasopharyngeal swab specimens and should not be  used as a sole basis for treatment. Nasal washings and aspirates  are unacceptable for Xpert Xpress SARS-CoV-2/FLU/RSV testing.  Fact Sheet for Patients: EntrepreneurPulse.com.au  Fact Sheet for Healthcare Providers: IncredibleEmployment.be  This test is not yet approved or cleared by the [place] FDA and has been authorized for detection and/or diagnosis of SARS-CoV-2 by FDA under an Emergency Use Authorization (EUA). This EUA will remain in effect (meaning this test can be used) for the duration of the COVID-19 declaration under Section 564(b)(1) of the Act, 21 U.S.C. section 360bbb-3(b)(1), unless the authorization is terminated or revoked.  Performed at [facility], [place] [address], [place], [place] [address]   Blood Culture (routine x 2)     Status: None (Preliminary result)   Collection Time: 09/27/21 12:38 AM   Specimen: BLOOD LEFT FOREARM  Result Value Ref Range Status   Specimen Description BLOOD LEFT FOREARM  Final   Special Requests   Final    BOTTLES DRAWN AEROBIC AND ANAEROBIC Blood Culture adequate volume   Culture   Final    NO GROWTH 2 DAYS Performed at [facility], [place] [address] [place], [place] [address]    Report Status PENDING  Incomplete    Radiology Studies: DG CHEST PORT 1 VIEW  Result Date: 09/28/2021 CLINICAL DATA:  Dyspnea.  History of CHF EXAM: PORTABLE CHEST 1 VIEW COMPARISON:  09/28/2021 FINDINGS: Right-sided chest port remains in place. Cardiomegaly. Prior median sternotomy. Bilateral patchy airspace opacities persist, overall similar to the previous radiograph. Small right pleural effusion. No pneumothorax. IMPRESSION: Bilateral patchy airspace opacities persist, overall similar to previous radiograph. Electronically Signed   By: Davina Poke D.O.   On: 09/28/2021 15:28      Oni Dietzman T. Craig  If 7PM-7AM, please contact night-coverage [email or web address] 09/29/2021, 11:35 AM

## 2021-09-29 NOTE — Progress Notes (Signed)
Echocardiogram 2D Echocardiogram has been performed.  Oneal Deputy Veronia Laprise RDCS 09/29/2021, 4:02 PM

## 2021-09-29 NOTE — Plan of Care (Signed)
  Problem: Education: Goal: Knowledge of General Education information will improve Description Including pain rating scale, medication(s)/side effects and non-pharmacologic comfort measures Outcome: Progressing   

## 2021-09-29 NOTE — Progress Notes (Addendum)
   Palliative Medicine Inpatient Follow Up Note  Consulting Provider: Mercy Riding, MD   Reason for consult:   [name] Palliative Medicine Consult  Reason for Consult? Goal of care counseling      HPI:  Per intake H&P --> 80 year old M with PMH of combined CHF, stage IV lung cancer, DM-1, chronic hypoxic RF on 2 L, A. fib/flutter on Eliquis, CAD, obesity, hard of hearing, recent "UTI" and ambulatory dysfunction presenting with progressive weakness and acute shortness of breath.  He is admitted with acute on chronic hypoxic RF due to CHF exacerbation.  He was hypoxic to 52% on RA requiring up to 6 L to recover.  BNP elevated to 1200.  CXR with pulmonary edema.    Palliative care was asked to get involved to discuss goals of care in the setting of multiple comorbid conditions.  Today's Discussion (09/29/2021):  *Please note that this is a verbal dictation therefore any spelling or grammatical errors are due to the "[place] [employer]" system interpretation.  Chart reviewed.   I met with Randall Wilkins at bedside. He is resting and his wife, Randall Wilkins is present. We discussed Randall Wilkins's present health and how he has intermittently required Bipap for respiratory distress in the setting of PNA. We reviewed his status in the setting of volume overload. Both he and his wife remain hopeful for improvements. We reviewed that presently he is in a tenuous state and it is a wait and see situation for the time being.   Patients spouse shares that she has seen him ill with PNA but never to this extend. Questions and concerns addressed / Palliative support provided  Objective Assessment: Vital Signs Vitals:   09/29/21 0450 09/29/21 0828  BP:    Pulse: 64 (!) 53  Resp: (!) 23 (!) 22  Temp:    SpO2: 97% 93%    Intake/Output Summary (Last 24 hours) at 09/29/2021 0913 Last data filed at 09/29/2021 8850 Gross per 24 hour  Intake --  Output 2225 ml  Net -2225 ml   Last Weight  Most  recent update: 09/29/2021  3:48 AM    Weight  97.5 kg (214 lb 15.2 oz)            Gen: Frail elderly male in mild distress HEENT: moist mucous membranes CV: Regular rate and irregular rhythm PULM: On 10L nasal cannula with diffuse rhonchi and crackles bilaterally ABD: soft/nontender EXT: (+) edema Neuro: Alert and oriented x2-3 intermittently forgetful  SUMMARY OF RECOMMENDATIONS   DNAR/DNI   MOST / DNR Form Completed, paper copy placed onto the chart electric copy can be found in [facility]   A copy of advance directives has been obtained  Presently watchful waiting to see if improvements can be made  SLP today for concerns of aspiration   TOC - OP Palliative support   Ongoing incremental   Time Spent: 25 Greater than 50% of the time was spent in counseling and coordination of care ______________________________________________________________________________________ [place] Team Team Cell Phone: [phone] Please utilize secure chat with additional questions, if there is no response within 30 minutes please call the above phone number  Palliative Medicine Team providers are available by phone from 7am to 7pm daily and can be reached through the team cell phone.  Should this patient require assistance outside of these hours, please call the patient's attending physician.

## 2021-09-30 ENCOUNTER — Inpatient Hospital Stay (HOSPITAL_COMMUNITY): Payer: Medicare Other

## 2021-09-30 DIAGNOSIS — I4892 Unspecified atrial flutter: Secondary | ICD-10-CM | POA: Diagnosis not present

## 2021-09-30 DIAGNOSIS — E1065 Type 1 diabetes mellitus with hyperglycemia: Secondary | ICD-10-CM

## 2021-09-30 DIAGNOSIS — I440 Atrioventricular block, first degree: Secondary | ICD-10-CM

## 2021-09-30 DIAGNOSIS — C3491 Malignant neoplasm of unspecified part of right bronchus or lung: Secondary | ICD-10-CM | POA: Diagnosis not present

## 2021-09-30 DIAGNOSIS — R0603 Acute respiratory distress: Secondary | ICD-10-CM | POA: Diagnosis not present

## 2021-09-30 DIAGNOSIS — I5041 Acute combined systolic (congestive) and diastolic (congestive) heart failure: Secondary | ICD-10-CM

## 2021-09-30 DIAGNOSIS — D631 Anemia in chronic kidney disease: Secondary | ICD-10-CM

## 2021-09-30 DIAGNOSIS — J9601 Acute respiratory failure with hypoxia: Secondary | ICD-10-CM | POA: Diagnosis not present

## 2021-09-30 DIAGNOSIS — I4581 Long QT syndrome: Secondary | ICD-10-CM

## 2021-09-30 DIAGNOSIS — E1022 Type 1 diabetes mellitus with diabetic chronic kidney disease: Secondary | ICD-10-CM

## 2021-09-30 DIAGNOSIS — I130 Hypertensive heart and chronic kidney disease with heart failure and stage 1 through stage 4 chronic kidney disease, or unspecified chronic kidney disease: Principal | ICD-10-CM

## 2021-09-30 DIAGNOSIS — I5043 Acute on chronic combined systolic (congestive) and diastolic (congestive) heart failure: Secondary | ICD-10-CM

## 2021-09-30 DIAGNOSIS — I4891 Unspecified atrial fibrillation: Secondary | ICD-10-CM | POA: Diagnosis not present

## 2021-09-30 DIAGNOSIS — I2510 Atherosclerotic heart disease of native coronary artery without angina pectoris: Secondary | ICD-10-CM | POA: Diagnosis not present

## 2021-09-30 DIAGNOSIS — E1040 Type 1 diabetes mellitus with diabetic neuropathy, unspecified: Secondary | ICD-10-CM

## 2021-09-30 DIAGNOSIS — J9621 Acute and chronic respiratory failure with hypoxia: Secondary | ICD-10-CM

## 2021-09-30 LAB — GLUCOSE, CAPILLARY
Glucose-Capillary: 136 mg/dL — ABNORMAL HIGH (ref 70–99)
Glucose-Capillary: 223 mg/dL — ABNORMAL HIGH (ref 70–99)
Glucose-Capillary: 144 mg/dL — ABNORMAL HIGH (ref 70–99)
Glucose-Capillary: 171 mg/dL — ABNORMAL HIGH (ref 70–99)

## 2021-09-30 LAB — BLOOD GAS, VENOUS
Acid-Base Excess: 1.3 mmol/L (ref 0.0–2.0)
Bicarbonate: 25.2 mmol/L (ref 20.0–28.0)
FIO2: 70
O2 Saturation: 91.8 %
Patient temperature: 38
pCO2, Ven: 41.3 mmHg — ABNORMAL LOW (ref 44.0–60.0)
pH, Ven: 7.408 (ref 7.250–7.430)
pO2, Ven: 67.3 mmHg — ABNORMAL HIGH (ref 32.0–45.0)

## 2021-09-30 LAB — RENAL FUNCTION PANEL
Albumin: 2.2 g/dL — ABNORMAL LOW (ref 3.5–5.0)
Anion gap: 9 (ref 5–15)
BUN: 41 mg/dL — ABNORMAL HIGH (ref 8–23)
CO2: 26 mmol/L (ref 22–32)
Calcium: 8.6 mg/dL — ABNORMAL LOW (ref 8.9–10.3)
Chloride: 104 mmol/L (ref 98–111)
Creatinine, Ser: 1.78 mg/dL — ABNORMAL HIGH (ref 0.61–1.24)
GFR, Estimated: 38 mL/min — ABNORMAL LOW (ref >60)
Glucose, Bld: 142 mg/dL — ABNORMAL HIGH (ref 70–99)
Phosphorus: 3.7 mg/dL (ref 2.5–4.6)
Potassium: 3.7 mmol/L (ref 3.5–5.1)
Sodium: 139 mmol/L (ref 135–145)

## 2021-09-30 LAB — TROPONIN I (HIGH SENSITIVITY)
Troponin I (High Sensitivity): 60 ng/L — ABNORMAL HIGH (ref <18)
Troponin I (High Sensitivity): 77 ng/L — ABNORMAL HIGH (ref <18)

## 2021-09-30 LAB — CBC
HCT: 29.9 % — ABNORMAL LOW (ref 39.0–52.0)
Hemoglobin: 8.9 g/dL — ABNORMAL LOW (ref 13.0–17.0)
MCH: 28.3 pg (ref 26.0–34.0)
MCHC: 29.8 g/dL — ABNORMAL LOW (ref 30.0–36.0)
MCV: 95.2 fL (ref 80.0–100.0)
Platelets: 110 10*3/uL — ABNORMAL LOW (ref 150–400)
RBC: 3.14 MIL/uL — ABNORMAL LOW (ref 4.22–5.81)
RDW: 17.2 % — ABNORMAL HIGH (ref 11.5–15.5)
WBC: 7.2 10*3/uL (ref 4.0–10.5)
nRBC: 0 % (ref 0.0–0.2)

## 2021-09-30 LAB — MAGNESIUM: Magnesium: 2 mg/dL (ref 1.7–2.4)

## 2021-09-30 LAB — BRAIN NATRIURETIC PEPTIDE: B Natriuretic Peptide: 723.3 pg/mL — ABNORMAL HIGH (ref 0.0–100.0)

## 2021-09-30 MED ORDER — METHYLPREDNISOLONE SODIUM SUCC 125 MG IJ SOLR
125.0000 mg | Freq: Once | INTRAMUSCULAR | Status: AC
  Administered 2021-09-30: 125 mg via INTRAVENOUS
  Filled 2021-09-30: qty 2

## 2021-09-30 MED ORDER — APIXABAN 5 MG PO TABS: 5.0000 mg | ORAL_TABLET | Freq: Two times a day (BID) | ORAL | Status: DC

## 2021-09-30 MED ORDER — FUROSEMIDE 10 MG/ML IJ SOLN
80.0000 mg | Freq: Once | INTRAMUSCULAR | Status: AC
  Administered 2021-09-30: 80 mg via INTRAVENOUS
  Filled 2021-09-30: qty 8

## 2021-09-30 MED ORDER — ALLOPURINOL 100 MG PO TABS
100.0000 mg | ORAL_TABLET | Freq: Every day | ORAL | Status: DC
  Administered 2021-09-30 – 2021-10-03 (×4): 100 mg via ORAL
  Filled 2021-09-30 (×4): qty 1

## 2021-09-30 MED ORDER — LEVOTHYROXINE SODIUM 88 MCG PO TABS
88.0000 ug | ORAL_TABLET | Freq: Every day | ORAL | Status: DC
  Administered 2021-10-01 – 2021-10-04 (×4): 88 ug via ORAL
  Filled 2021-09-30 (×4): qty 1

## 2021-09-30 MED ORDER — POTASSIUM CHLORIDE CRYS ER 20 MEQ PO TBCR
40.0000 meq | EXTENDED_RELEASE_TABLET | Freq: Once | ORAL | Status: AC
  Administered 2021-09-30: 40 meq via ORAL
  Filled 2021-09-30: qty 2

## 2021-09-30 MED ORDER — APIXABAN 2.5 MG PO TABS
2.5000 mg | ORAL_TABLET | Freq: Two times a day (BID) | ORAL | Status: DC
  Administered 2021-09-30 – 2021-10-03 (×7): 2.5 mg via ORAL
  Filled 2021-09-30 (×7): qty 1

## 2021-09-30 MED ORDER — METRONIDAZOLE 500 MG PO TABS
500.0000 mg | ORAL_TABLET | Freq: Two times a day (BID) | ORAL | Status: DC
  Administered 2021-09-30 – 2021-10-03 (×8): 500 mg via ORAL
  Filled 2021-09-30 (×8): qty 1

## 2021-09-30 NOTE — Progress Notes (Signed)
PROGRESS NOTE  Randall Wilkins CWC:[id number] DOB: 1941-05-24   PCP: [name], MD  Patient is from: Home.  Lives with his wife.  Uses walker at baseline but lately has been using wheelchair.  DOA: 09/26/2021 LOS: 3  Chief complaints:  Shortness of breath    Brief Narrative / Interim history: 80 year old M with PMH of combined CHF, stage IV lung cancer, hronic hypoxic RF on 2 L, PAH, A. fib/flutter on Eliquis, CAD, DM-1, cobesity, hard of hearing, recent "UTI" and ambulatory dysfunction presenting with progressive weakness and acute shortness of breath.  He is admitted with acute on chronic hypoxic RF due to CHF exacerbation.  He was hypoxic to 52% on RA requiring up to 6 L to recover.  BNP elevated to 1200.  CXR with pulmonary edema.  Troponin 76>> 83.  EKG rate controlled A. fib with occasional PVCs.  Started on IV Lasix.  Patient was to have cardioversion next week.   The next day, cardiology and palliative medicine consulted.  CODE STATUS changed to DNR/DNR.  High side respiratory distress, increased oxygen requirement and worsening chest x-ray despite antibiotics and excellent urine output requiring BiPAP.  Also spiked fever to 102.3.  Antibiotics broadened to IV cefepime and Flagyl.  Remains on IV Lasix.  Now seems to be improving.   Subjective: Seen and examined earlier this morning.  No major events overnight or this morning.  Patient's wife at bedside.  Heart good night last night.  No complaint this morning.  He denies chest pain, dyspnea, cough, GI or UTI symptoms.  Able to transfer from bed to chair on his own with supervision.  Weaned to 13 L by HFNC this morning.  Objective: Vitals:   09/30/21 0407 09/30/21 0834 09/30/21 0836 09/30/21 1206  BP: 130/80   121/67  Pulse: 64   72  Resp: 20   18  Temp: 98.7 F (37.1 C)   98.1 F (36.7 C)  TempSrc: Oral   Oral  SpO2: 97% 100% 100% 92%  Weight: 94.6 kg     Height:        Intake/Output Summary (Last 24 hours) at  09/30/2021 1313 Last data filed at 09/30/2021 0829 Gross per 24 hour  Intake 1117.48 ml  Output 1200 ml  Net -82.52 ml   Filed Weights   09/27/21 0547 09/29/21 0343 09/30/21 0407  Weight: 97.5 kg 97.5 kg 94.6 kg    Examination:  GENERAL: No apparent distress.  Nontoxic. HEENT: MMM.  Vision grossly intact.  Hard of hearing. NECK: Supple.  No apparent JVD.  RESP: 97% on 13 L at rest.  No IWOB.  Fair aeration bilaterally.  Bibasilar crackles. CVS:  RRR.  2/6 SEM over LLSB. ABD/GI/GU: BS+. Abd soft, NTND.  MSK/EXT:  Moves extremities. No apparent deformity.  1+ edema in BLE. SKIN: no apparent skin lesion or wound NEURO: Awake and alert. Oriented fairly.  No apparent focal neuro deficit. PSYCH: Calm. Normal affect.    Procedures:  None  Microbiology summarized: [id number] and influenza PCR nonreactive. Blood culture NGTD. Urine culture pending. Assessment & Plan: Acute on chronic hypoxic respiratory failure-on 2 L at baseline.  Likely due to CHF, lung cancer and possible pneumonia.  Required BiPAP.  Improving with diuretics and antibiotics.  Down to 13 L by HFNC this morning. -CTX >>cefepime and Flagyl 10/16>> -Continue Pulmicort, Brovana and as needed DuoNeb on 10/16 -Cleared for regular diet by SLP -Encourage incentive spirometry/OOB/PT/OT -Diuretics per cardiology  Acute on chronic combined CHF/severe PAH:  TTE with LVEF of 45 to 50%, global hypokinesis, G3 DD and RVSP of 51, sevTVR and mod AS).  TTE in 04/2021 with LVEF of 45 to 50%, mod AS, RVSP of 79 mmHg and mild to moderate TVR.  BNP 1400>> 723.  About 1.2 L UOP/24 hours.  Net -5 L so far.  Renal function stable. -Appreciate help by cardiology-IV Lasix 60 mg daily -Continue holding Imdur and lisinopril given soft blood pressure -Monitor fluid status, renal functions and electrolytes. -Sodium and fluid restriction -Intermittent BiPAP as above.   Elevated troponin/history of CABG in 2016: Likely demand ischemia due to CHF  and hypoxia.  No significant delta.  No chest pain.  EKG without ischemic finding.  First degree AVB/prolonged QT-patient is on amiodarone and Lasix. -Defer those to cardiology   IDDM-1 with neuropathy and hyperglycemia: A1c 6.6% on 09/09/2021. Recent Labs  Lab 09/29/21 1140 09/29/21 1620 09/29/21 2122 09/30/21 0606 09/30/21 1208  GLUCAP 119* 198* 160* 136* 223*  -Continue current insulin regimen -Adjust as appropriate.  AKI on CKD-3B/azotemia-seems cardiorenal.  Improving with IV diuretics. Recent Labs    06/17/21 1254 07/22/21 0930 08/20/21 0922 09/08/21 0935 09/27/21 0020 09/27/21 0407 09/28/21 0449 09/28/21 0930 09/29/21 0703 09/30/21 0515  BUN 41* 25* 21 26* 39* 38* 35* 35* 36* 41*  CREATININE 1.73* 1.67* 1.63* 1.83* 2.22* 2.27* 1.99* 1.86* 1.76* 1.78*  -Continue monitoring -Further work-up if worse or no improvement   Persistent A. fib/flutter-rate controlled.  On amiodarone and Eliquis.  Reports good compliance with Eliquis.  Plan was for possible cardioversion next week but he got admitted -Continue amiodarone and Eliquis -Plan for DCCV on 10/19?  Possible UTI: Reportedly had increased frequency when he presented to PCP.  He was started on high-dose Keflex for his renal function.  UA negative.  Seems urine culture was not sent yet. -Antibiotics as above  Anemia of chronic disease-Baseline Hgb 9-10.  Stable. Recent Labs    06/17/21 1254 07/22/21 0930 08/20/21 0922 09/08/21 0935 09/26/21 2354 09/27/21 0020 09/27/21 0407 09/28/21 0449 09/29/21 0622 09/30/21 0515  HGB 10.1* 9.7* 9.6* 9.5* 11.6* 8.7* 8.8* 9.8* 9.7* 8.9*  -Continue monitoring  Generalized weakness/physical deconditioning/chronic LLE weakness-Lately using wheelchair -PT/OT consult  Essential hypertension: Normotensive this morning. -IV Lasix as above -Hold Imdur, amlodipine and lisinopril given soft blood pressures   Adenocarcinoma of right lung, stage 4  -Continue oral Lumakras. Told  wife to bring this med to hospital.  BPH without LUTS -Continue Flomax  Hypothyroidism: -Continue home Synthroid  Goal of care-patient with significant comorbidities as above.  Poor long-term prognosis.  Not sure if CPR and intubation are to his best interest.  -Appreciate input by palliative care.  Agree with DNR/DNI.   Body mass index is 32.66 kg/m.         DVT prophylaxis:  apixaban (ELIQUIS) tablet 2.5 mg Start: 09/29/21 1300 SCDs Start: 09/27/21 0317 apixaban (ELIQUIS) tablet 2.5 mg  Code Status: Full code Family Communication: Updated patient's wife at bedside. Level of care: Telemetry Medical Status is: Inpatient  Remains inpatient appropriate because: Significantly high oxygen requirement, significant fluid overload requiring IV diuretics,  pneumonia requiring IV antibiotics and further cardiac procedure   Consultants:  Cardiology Palliative medicine   Sch Meds:  Scheduled Meds:  allopurinol  100 mg Oral Daily   amiodarone  200 mg Oral BID   apixaban  2.5 mg Oral BID   arformoterol  15 mcg Nebulization BID   budesonide (PULMICORT) nebulizer solution  0.5 mg Nebulization  BID   Chlorhexidine Gluconate Cloth  6 each Topical Daily   furosemide  60 mg Intravenous Daily   insulin aspart  0-15 Units Subcutaneous TID WC   insulin aspart  0-5 Units Subcutaneous QHS   [START ON 10/01/2021] levothyroxine  88 mcg Oral Q0600   metroNIDAZOLE  500 mg Oral Q12H   rosuvastatin  20 mg Oral Daily   sodium chloride flush  10 mL Intravenous Q12H   sodium chloride flush  10-40 mL Intracatheter Q12H   sotorasib  960 mg Oral Daily   tamsulosin  0.4 mg Oral QHS   umeclidinium bromide  1 puff Inhalation Daily   Continuous Infusions:  ceFEPime (MAXIPIME) IV 2 g (09/30/21 0848)   PRN Meds:.acetaminophen **OR** acetaminophen, ipratropium-albuterol, sodium chloride flush, sodium chloride flush  Antimicrobials: Anti-infectives (From admission, onward)    Start     Dose/Rate  Route Frequency Ordered Stop   09/30/21 1000  metroNIDAZOLE (FLAGYL) tablet 500 mg        500 mg Oral Every 12 hours 09/30/21 0838     09/28/21 1800  metroNIDAZOLE (FLAGYL) IVPB 500 mg  Status:  Discontinued        500 mg 100 mL/hr over 60 Minutes Intravenous Every 12 hours 09/28/21 1520 09/30/21 0838   09/28/21 1630  ceFEPIme (MAXIPIME) 2 g in sodium chloride 0.9 % 100 mL IVPB        2 g 200 mL/hr over 30 Minutes Intravenous Every 12 hours 09/28/21 1537     09/27/21 0930  cefTRIAXone (ROCEPHIN) 1 g in sodium chloride 0.9 % 100 mL IVPB  Status:  Discontinued        1 g 200 mL/hr over 30 Minutes Intravenous Every 24 hours 09/27/21 0839 09/28/21 1520        I have personally reviewed the following labs and images: CBC: Recent Labs  Lab 09/27/21 0020 09/27/21 0407 09/28/21 0449 09/29/21 0622 09/30/21 0515  WBC 8.1 6.9 8.2 9.8 7.2  NEUTROABS 6.3 4.7  --   --   --   HGB 8.7* 8.8* 9.8* 9.7* 8.9*  HCT 29.4* 28.8* 32.4* 31.6* 29.9*  MCV 96.4 95.0 94.5 93.5 95.2  PLT 121* 96* 102* 110* 110*   BMP &GFR Recent Labs  Lab 09/27/21 0407 09/28/21 0449 09/28/21 0930 09/29/21 0703 09/30/21 0515  NA 137 138 139 136 139  K 4.3 3.7 3.6 3.7 3.7  CL 105 103 102 102 104  CO2 24 25 26 24 26   GLUCOSE 169* 105* 199* 141* 142*  BUN 38* 35* 35* 36* 41*  CREATININE 2.27* 1.99* 1.86* 1.76* 1.78*  CALCIUM 8.4* 8.5* 8.5* 8.6* 8.6*  MG 2.2 2.0  --  2.3 2.0  PHOS  --  4.3  --  4.2 3.7   Estimated Creatinine Clearance: 36.3 mL/min (A) (by C-G formula based on SCr of 1.78 mg/dL (H)). Liver & Pancreas: Recent Labs  Lab 09/27/21 0020 09/27/21 0407 09/28/21 0449 09/29/21 0703 09/30/21 0515  AST 17 15  --   --   --   ALT 11 8  --   --   --   ALKPHOS 92 89  --   --   --   BILITOT 0.9 0.7  --   --   --   PROT 5.8* 5.6*  --   --   --   ALBUMIN 2.8* 2.6* 2.7* 2.6* 2.2*   No results for input(s): LIPASE, AMYLASE in the last 168 hours. No results for input(s): AMMONIA in the  last 168  hours. Diabetic: No results for input(s): HGBA1C in the last 72 hours. Recent Labs  Lab 09/29/21 1140 09/29/21 1620 09/29/21 2122 09/30/21 0606 09/30/21 1208  GLUCAP 119* 198* 160* 136* 223*   Cardiac Enzymes: Recent Labs  Lab 09/28/21 0449  CKTOTAL 38*   No results for input(s): PROBNP in the last 8760 hours. Coagulation Profile: Recent Labs  Lab 09/27/21 0020  INR 1.8*   Thyroid Function Tests: No results for input(s): TSH, T4TOTAL, FREET4, T3FREE, THYROIDAB in the last 72 hours. Lipid Profile: No results for input(s): CHOL, HDL, LDLCALC, TRIG, CHOLHDL, LDLDIRECT in the last 72 hours. Anemia Panel: No results for input(s): VITAMINB12, FOLATE, FERRITIN, TIBC, IRON, RETICCTPCT in the last 72 hours. Urine analysis:    Component Value Date/Time   COLORURINE YELLOW 09/27/2021 0021   APPEARANCEUR CLEAR 09/27/2021 0021   LABSPEC 1.011 09/27/2021 0021   PHURINE 5.0 09/27/2021 0021   GLUCOSEU NEGATIVE 09/27/2021 0021   GLUCOSEU NEGATIVE 08/29/2019 0959   HGBUR NEGATIVE 09/27/2021 0021   BILIRUBINUR NEGATIVE 09/27/2021 0021   KETONESUR NEGATIVE 09/27/2021 0021   PROTEINUR 30 (A) 09/27/2021 0021   UROBILINOGEN 0.2 08/29/2019 0959   NITRITE NEGATIVE 09/27/2021 0021   LEUKOCYTESUR NEGATIVE 09/27/2021 0021   Sepsis Labs: Invalid input(s): PROCALCITONIN, [place]  Microbiology: Recent Results (from the past 240 hour(s))  Blood Culture (routine x 2)     Status: None (Preliminary result)   Collection Time: 09/27/21 12:20 AM   Specimen: BLOOD  Result Value Ref Range Status   Specimen Description BLOOD PORTA CATH  Final   Special Requests   Final    BOTTLES DRAWN AEROBIC AND ANAEROBIC Blood Culture adequate volume   Culture   Final    NO GROWTH 3 DAYS Performed at [facility], [address] [place], [place] [address]    Report Status PENDING  Incomplete  Resp Panel by RT-PCR (Flu A&B, Covid) Peripheral     Status: None   Collection Time: 09/27/21 12:25 AM    Specimen: Peripheral; Nasopharyngeal(NP) swabs in vial transport medium  Result Value Ref Range Status   SARS Coronavirus 2 by RT PCR NEGATIVE NEGATIVE Final    Comment: (NOTE) SARS-CoV-2 target nucleic acids are NOT DETECTED.  The SARS-CoV-2 RNA is generally detectable in upper respiratory specimens during the acute phase of infection. The lowest concentration of SARS-CoV-2 viral copies this assay can detect is 138 copies/mL. A negative result does not preclude SARS-Cov-2 infection and should not be used as the sole basis for treatment or other patient management decisions. A negative result may occur with  improper specimen collection/handling, submission of specimen other than nasopharyngeal swab, presence of viral mutation(s) within the areas targeted by this assay, and inadequate number of viral copies(<138 copies/mL). A negative result must be combined with clinical observations, patient history, and epidemiological information. The expected result is Negative.  Fact Sheet for Patients:  EntrepreneurPulse.com.au  Fact Sheet for Healthcare Providers:  IncredibleEmployment.be  This test is no t yet approved or cleared by the [place] FDA and  has been authorized for detection and/or diagnosis of SARS-CoV-2 by FDA under an Emergency Use Authorization (EUA). This EUA will remain  in effect (meaning this test can be used) for the duration of the COVID-19 declaration under Section 564(b)(1) of the Act, 21 U.S.C.section 360bbb-3(b)(1), unless the authorization is terminated  or revoked sooner.       Influenza A by PCR NEGATIVE NEGATIVE Final   Influenza B by PCR NEGATIVE  NEGATIVE Final    Comment: (NOTE) The Xpert Xpress SARS-CoV-2/FLU/RSV plus assay is intended as an aid in the diagnosis of influenza from Nasopharyngeal swab specimens and should not be used as a sole basis for treatment. Nasal washings and aspirates are unacceptable  for Xpert Xpress SARS-CoV-2/FLU/RSV testing.  Fact Sheet for Patients: EntrepreneurPulse.com.au  Fact Sheet for Healthcare Providers: IncredibleEmployment.be  This test is not yet approved or cleared by the [place] FDA and has been authorized for detection and/or diagnosis of SARS-CoV-2 by FDA under an Emergency Use Authorization (EUA). This EUA will remain in effect (meaning this test can be used) for the duration of the COVID-19 declaration under Section 564(b)(1) of the Act, 21 U.S.C. section 360bbb-3(b)(1), unless the authorization is terminated or revoked.  Performed at [facility], [place] [address] [place], [place] [address]   Blood Culture (routine x 2)     Status: None (Preliminary result)   Collection Time: 09/27/21 12:38 AM   Specimen: BLOOD LEFT FOREARM  Result Value Ref Range Status   Specimen Description BLOOD LEFT FOREARM  Final   Special Requests   Final    BOTTLES DRAWN AEROBIC AND ANAEROBIC Blood Culture adequate volume   Culture   Final    NO GROWTH 3 DAYS Performed at [facility], [place] [address], [place], [place] [address]    Report Status PENDING  Incomplete    Radiology Studies: ECHOCARDIOGRAM COMPLETE  Result Date: 09/29/2021    ECHOCARDIOGRAM REPORT   Patient Name:   Laurance S Wilkins Date of Exam: 09/29/2021 Medical Rec #:  [id number]      Height:       67.0 in Accession #:    [id number]     Weight:       214.9 lb Date of Birth:  Jul 30, 1941      BSA:          2.085 m Patient Age:    67 years       BP:           120/58 mmHg Patient Gender: M              HR:           69 bpm. Exam Location:  Inpatient Procedure: 2D Echo, Color Doppler and Cardiac Doppler Indications:    H29.92 Acute systolic (congestive) heart failure  History:        Patient has prior history of Echocardiogram examinations, most                 recent 05/06/2021. CHF, Prior CABG; Risk Factors:Hypertension,                 Diabetes and  Dyslipidemia.  Sonographer:    Raquel Sarna Senior RDCS Referring Phys: [place]  1. Left ventricular ejection fraction, by estimation, is 45 to 50%. The left ventricle has mildly decreased function. The left ventricle demonstrates global hypokinesis. There is mild left ventricular hypertrophy. Left ventricular diastolic parameters are consistent with Grade III diastolic dysfunction (restrictive). There is the interventricular septum is flattened in systole and diastole, consistent with right ventricular pressure and volume overload.  2. Right ventricular systolic function is normal. The right ventricular size is normal. There is moderately elevated pulmonary artery systolic pressure. The estimated right ventricular systolic pressure is 42.6 mmHg.  3. The mitral valve is normal in structure. Mild mitral valve regurgitation. No evidence of mitral stenosis.  4. Tricuspid valve regurgitation is severe.  5. The aortic valve is calcified. There is severe calcifcation of the aortic valve. There is severe thickening of the aortic valve. Aortic valve regurgitation is trivial. Moderate aortic valve stenosis. Aortic valve mean gradient measures 21.0 mmHg. Aortic valve Vmax measures 3.53 m/s.  6. The inferior vena cava is normal in size with greater than 50% respiratory variability, suggesting right atrial pressure of 3 mmHg. Comparison(s): Prior mean aortic valve gradient 17.5 mmHg. FINDINGS  Left Ventricle: Left ventricular ejection fraction, by estimation, is 45 to 50%. The left ventricle has mildly decreased function. The left ventricle demonstrates global hypokinesis. The left ventricular internal cavity size was normal in size. There is  mild left ventricular hypertrophy. The interventricular septum is flattened in systole and diastole, consistent with right ventricular pressure and volume overload. Left ventricular diastolic parameters are consistent with Grade III diastolic dysfunction (restrictive). Right  Ventricle: The right ventricular size is normal. No increase in right ventricular wall thickness. Right ventricular systolic function is normal. There is moderately elevated pulmonary artery systolic pressure. The tricuspid regurgitant velocity is 3.28 m/s, and with an assumed right atrial pressure of 8 mmHg, the estimated right ventricular systolic pressure is 48.8 mmHg. Left Atrium: Left atrial size was normal in size. Right Atrium: Right atrial size was normal in size. Pericardium: There is no evidence of pericardial effusion. Mitral Valve: The mitral valve is normal in structure. Mild mitral valve regurgitation. No evidence of mitral valve stenosis. MV peak gradient, 8.4 mmHg. The mean mitral valve gradient is 3.0 mmHg. Tricuspid Valve: The tricuspid valve is normal in structure. Tricuspid valve regurgitation is severe. No evidence of tricuspid stenosis. Aortic Valve: The aortic valve is calcified. There is severe calcifcation of the aortic valve. There is severe thickening of the aortic valve. Aortic valve regurgitation is trivial. Moderate aortic stenosis is present. Aortic valve mean gradient measures  21.0 mmHg. Aortic valve peak gradient measures 49.8 mmHg. Aortic valve area, by VTI measures 0.70 cm. Pulmonic Valve: The pulmonic valve was normal in structure. Pulmonic valve regurgitation is not visualized. No evidence of pulmonic stenosis. Aorta: The aortic root is normal in size and structure. Venous: The inferior vena cava is normal in size with greater than 50% respiratory variability, suggesting right atrial pressure of 3 mmHg. IAS/Shunts: No atrial level shunt detected by color flow Doppler.  LEFT VENTRICLE PLAX 2D LVIDd:         4.60 cm   Diastology LVIDs:         3.70 cm   LV e' medial:    9.11 cm/s LV PW:         1.30 cm   LV E/e' medial:  14.3 LV IVS:        1.20 cm   LV e' lateral:   11.30 cm/s LVOT diam:     1.90 cm   LV E/e' lateral: 11.5 LV SV:         54 LV SV Index:   26 LVOT Area:     2.84  cm  RIGHT VENTRICLE RV S prime:     10.40 cm/s TAPSE (M-mode): 1.6 cm LEFT ATRIUM             Index        RIGHT ATRIUM           Index LA diam:        3.80 cm 1.82 cm/m   RA Area:     22.40 cm LA Vol (A2C):   62.4 ml 29.93  ml/m  RA Volume:   68.40 ml  32.80 ml/m LA Vol (A4C):   49.3 ml 23.64 ml/m LA Biplane Vol: 55.5 ml 26.62 ml/m  AORTIC VALVE AV Area (Vmax):    0.80 cm AV Area (Vmean):   1.06 cm AV Area (VTI):     0.70 cm AV Vmax:           353.00 cm/s AV Vmean:          198.000 cm/s AV VTI:            0.765 m AV Peak Grad:      49.8 mmHg AV Mean Grad:      21.0 mmHg LVOT Vmax:         99.80 cm/s LVOT Vmean:        73.900 cm/s LVOT VTI:          0.190 m LVOT/AV VTI ratio: 0.25  AORTA Ao Root diam: 3.10 cm Ao Asc diam:  3.10 cm MITRAL VALVE                TRICUSPID VALVE MV Area (PHT): 5.02 cm     TR Peak grad:   43.0 mmHg MV Area VTI:   1.78 cm     TR Vmax:        328.00 cm/s MV Peak grad:  8.4 mmHg MV Mean grad:  3.0 mmHg     SHUNTS MV Vmax:       1.45 m/s     Systemic VTI:  0.19 m MV Vmean:      87.3 cm/s    Systemic Diam: 1.90 cm MV Decel Time: 151 msec MV E velocity: 130.00 cm/s MV A velocity: 94.90 cm/s MV E/A ratio:  1.37 Candee Furbish MD Electronically signed by Candee Furbish MD Signature Date/Time: 09/29/2021/5:46:56 PM    Final       [employer]  If 7PM-7AM, please contact night-coverage [email or web address] 09/30/2021, 1:13 PM

## 2021-09-30 NOTE — Progress Notes (Addendum)
Subjective:   Still requiring high flow O2, but feels better today.  Ate a good breakfast   Objective:  Vitals:   09/29/21 1530 09/29/21 2010 09/29/21 2018 09/30/21 0407  BP:  124/72  130/80  Pulse:  64 64 64  Resp:  20 20 20   Temp:  98.5 F (36.9 C)  98.7 F (37.1 C)  TempSrc:  Oral  Oral  SpO2: 93% 96% 99% 97%  Weight:    94.6 kg  Height:        Intake/Output from previous day:  Intake/Output Summary (Last 24 hours) at 09/30/2021 0744 Last data filed at 09/30/2021 0600 Gross per 24 hour  Intake 1054.48 ml  Output 1200 ml  Net -145.52 ml    Physical Exam:  GEN: No acute distress.   Neck: JVD 10 CM Cardiac: RRR, 3/6 murmur, no rubs, or gallops.  Respiratory: diminished to auscultation bilaterally with rales  GI: Soft, nontender, non-distended  MS: 2+ LE edema; No deformity. Neuro:  Nonfocal  Psych: Normal affect    Lab Results: Basic Metabolic Panel: pending for 10/17 Recent Labs    09/29/21 0703 09/30/21 0515  NA 136 139  K 3.7 3.7  CL 102 104  CO2 24 26  GLUCOSE 141* 142*  BUN 36* 41*  CREATININE 1.76* 1.78*  CALCIUM 8.6* 8.6*  MG 2.3 2.0  PHOS 4.2 3.7   Liver Function Tests: Recent Labs    09/29/21 0703 09/30/21 0515  ALBUMIN 2.6* 2.2*   No results for input(s): LIPASE, AMYLASE in the last 72 hours. CBC: Recent Labs    09/29/21 0622 09/30/21 0515  WBC 9.8 7.2  HGB 9.7* 8.9*  HCT 31.6* 29.9*  MCV 93.5 95.2  PLT 110* 110*   Cardiac Enzymes: Recent Labs    09/28/21 0449  CKTOTAL 38*   BNP    Component Value Date/Time   BNP 723.3 (H) 09/30/2021 0515    Imaging: DG CHEST PORT 1 VIEW  Result Date: 09/28/2021 CLINICAL DATA:  Dyspnea.  History of CHF EXAM: PORTABLE CHEST 1 VIEW COMPARISON:  09/28/2021 FINDINGS: Right-sided chest port remains in place. Cardiomegaly. Prior median sternotomy. Bilateral patchy airspace opacities persist, overall similar to the previous radiograph. Small right pleural effusion. No pneumothorax.  IMPRESSION: Bilateral patchy airspace opacities persist, overall similar to previous radiograph. Electronically Signed   By: Davina Poke D.O.   On: 09/28/2021 15:28   ECHOCARDIOGRAM COMPLETE  Result Date: 09/29/2021    ECHOCARDIOGRAM REPORT   Patient Name:   Gaetan S Martinique Date of Exam: 09/29/2021 Medical Rec #:  [id number]      Height:       67.0 in Accession #:    [id number]     Weight:       214.9 lb Date of Birth:  04-23-1941      BSA:          2.085 m Patient Age:    10 years       BP:           120/58 mmHg Patient Gender: M              HR:           69 bpm. Exam Location:  Inpatient Procedure: 2D Echo, Color Doppler and Cardiac Doppler Indications:    N05.39 Acute systolic (congestive) heart failure  History:        Patient has prior history of Echocardiogram examinations, most  recent 05/06/2021. CHF, Prior CABG; Risk Factors:Hypertension,                 Diabetes and Dyslipidemia.  Sonographer:    Raquel Sarna Senior RDCS Referring Phys: [place]  1. Left ventricular ejection fraction, by estimation, is 45 to 50%. The left ventricle has mildly decreased function. The left ventricle demonstrates global hypokinesis. There is mild left ventricular hypertrophy. Left ventricular diastolic parameters are consistent with Grade III diastolic dysfunction (restrictive). There is the interventricular septum is flattened in systole and diastole, consistent with right ventricular pressure and volume overload.  2. Right ventricular systolic function is normal. The right ventricular size is normal. There is moderately elevated pulmonary artery systolic pressure. The estimated right ventricular systolic pressure is 30.8 mmHg.  3. The mitral valve is normal in structure. Mild mitral valve regurgitation. No evidence of mitral stenosis.  4. Tricuspid valve regurgitation is severe.  5. The aortic valve is calcified. There is severe calcifcation of the aortic valve. There is severe thickening  of the aortic valve. Aortic valve regurgitation is trivial. Moderate aortic valve stenosis. Aortic valve mean gradient measures 21.0 mmHg. Aortic valve Vmax measures 3.53 m/s.  6. The inferior vena cava is normal in size with greater than 50% respiratory variability, suggesting right atrial pressure of 3 mmHg. Comparison(s): Prior mean aortic valve gradient 17.5 mmHg. FINDINGS  Left Ventricle: Left ventricular ejection fraction, by estimation, is 45 to 50%. The left ventricle has mildly decreased function. The left ventricle demonstrates global hypokinesis. The left ventricular internal cavity size was normal in size. There is  mild left ventricular hypertrophy. The interventricular septum is flattened in systole and diastole, consistent with right ventricular pressure and volume overload. Left ventricular diastolic parameters are consistent with Grade III diastolic dysfunction (restrictive). Right Ventricle: The right ventricular size is normal. No increase in right ventricular wall thickness. Right ventricular systolic function is normal. There is moderately elevated pulmonary artery systolic pressure. The tricuspid regurgitant velocity is 3.28 m/s, and with an assumed right atrial pressure of 8 mmHg, the estimated right ventricular systolic pressure is 65.7 mmHg. Left Atrium: Left atrial size was normal in size. Right Atrium: Right atrial size was normal in size. Pericardium: There is no evidence of pericardial effusion. Mitral Valve: The mitral valve is normal in structure. Mild mitral valve regurgitation. No evidence of mitral valve stenosis. MV peak gradient, 8.4 mmHg. The mean mitral valve gradient is 3.0 mmHg. Tricuspid Valve: The tricuspid valve is normal in structure. Tricuspid valve regurgitation is severe. No evidence of tricuspid stenosis. Aortic Valve: The aortic valve is calcified. There is severe calcifcation of the aortic valve. There is severe thickening of the aortic valve. Aortic valve  regurgitation is trivial. Moderate aortic stenosis is present. Aortic valve mean gradient measures  21.0 mmHg. Aortic valve peak gradient measures 49.8 mmHg. Aortic valve area, by VTI measures 0.70 cm. Pulmonic Valve: The pulmonic valve was normal in structure. Pulmonic valve regurgitation is not visualized. No evidence of pulmonic stenosis. Aorta: The aortic root is normal in size and structure. Venous: The inferior vena cava is normal in size with greater than 50% respiratory variability, suggesting right atrial pressure of 3 mmHg. IAS/Shunts: No atrial level shunt detected by color flow Doppler.  LEFT VENTRICLE PLAX 2D LVIDd:         4.60 cm   Diastology LVIDs:         3.70 cm   LV e' medial:    9.11 cm/s LV  PW:         1.30 cm   LV E/e' medial:  14.3 LV IVS:        1.20 cm   LV e' lateral:   11.30 cm/s LVOT diam:     1.90 cm   LV E/e' lateral: 11.5 LV SV:         54 LV SV Index:   26 LVOT Area:     2.84 cm  RIGHT VENTRICLE RV S prime:     10.40 cm/s TAPSE (M-mode): 1.6 cm LEFT ATRIUM             Index        RIGHT ATRIUM           Index LA diam:        3.80 cm 1.82 cm/m   RA Area:     22.40 cm LA Vol (A2C):   62.4 ml 29.93 ml/m  RA Volume:   68.40 ml  32.80 ml/m LA Vol (A4C):   49.3 ml 23.64 ml/m LA Biplane Vol: 55.5 ml 26.62 ml/m  AORTIC VALVE AV Area (Vmax):    0.80 cm AV Area (Vmean):   1.06 cm AV Area (VTI):     0.70 cm AV Vmax:           353.00 cm/s AV Vmean:          198.000 cm/s AV VTI:            0.765 m AV Peak Grad:      49.8 mmHg AV Mean Grad:      21.0 mmHg LVOT Vmax:         99.80 cm/s LVOT Vmean:        73.900 cm/s LVOT VTI:          0.190 m LVOT/AV VTI ratio: 0.25  AORTA Ao Root diam: 3.10 cm Ao Asc diam:  3.10 cm MITRAL VALVE                TRICUSPID VALVE MV Area (PHT): 5.02 cm     TR Peak grad:   43.0 mmHg MV Area VTI:   1.78 cm     TR Vmax:        328.00 cm/s MV Peak grad:  8.4 mmHg MV Mean grad:  3.0 mmHg     SHUNTS MV Vmax:       1.45 m/s     Systemic VTI:  0.19 m MV Vmean:       87.3 cm/s    Systemic Diam: 1.90 cm MV Decel Time: 151 msec MV E velocity: 130.00 cm/s MV A velocity: 94.90 cm/s MV E/A ratio:  1.37 Candee Furbish MD Electronically signed by Candee Furbish MD Signature Date/Time: 09/29/2021/5:46:56 PM    Final     Cardiac Studies:  ECG: afib PVC nonspecific ST changes, repeat ECG pending   Telemetry: Sinus rhythm versus slow atrial flutter  Echo: 10/17  1. Left ventricular ejection fraction, by estimation, is 45 to 50%. The  left ventricle has mildly decreased function. The left ventricle  demonstrates global hypokinesis. There is mild left ventricular  hypertrophy. Left ventricular diastolic parameters  are consistent with Grade III diastolic dysfunction (restrictive). There  is the interventricular septum is flattened in systole and diastole,  consistent with right ventricular pressure and volume overload.   2. Right ventricular systolic function is normal. The right ventricular  size is normal. There is moderately elevated pulmonary artery systolic  pressure. The estimated right ventricular  systolic pressure is 43.8 mmHg.   3. The mitral valve is normal in structure. Mild mitral valve  regurgitation. No evidence of mitral stenosis.   4. Tricuspid valve regurgitation is severe.   5. The aortic valve is calcified. There is severe calcifcation of the  aortic valve. There is severe thickening of the aortic valve. Aortic valve  regurgitation is trivial. Moderate aortic valve stenosis. Aortic valve  mean gradient measures 21.0 mmHg.  Aortic valve Vmax measures 3.53 m/s.   6. The inferior vena cava is normal in size with greater than 50%  respiratory variability, suggesting right atrial pressure of 3 mmHg.  Comparison(s): Prior mean aortic valve gradient 17.5 mmHg.  TEE 05/06/21 EF 55-60% moderate AS mean gradient 24 mmHg   Medications:    amiodarone  200 mg Oral BID   apixaban  2.5 mg Oral BID   arformoterol  15 mcg Nebulization BID   budesonide (PULMICORT)  nebulizer solution  0.5 mg Nebulization BID   Chlorhexidine Gluconate Cloth  6 each Topical Daily   furosemide  60 mg Intravenous Daily   insulin aspart  0-15 Units Subcutaneous TID WC   insulin aspart  0-5 Units Subcutaneous QHS   [START ON 10/01/2021] levothyroxine  44 mcg Intravenous Daily   rosuvastatin  20 mg Oral Daily   sodium chloride flush  10 mL Intravenous Q12H   sodium chloride flush  10-40 mL Intracatheter Q12H   sotorasib  960 mg Oral Daily   tamsulosin  0.4 mg Oral QHS   umeclidinium bromide  1 puff Inhalation Daily      ceFEPime (MAXIPIME) IV 2 g (09/29/21 2235)   metronidazole 500 mg (09/29/21 2112)    Assessment/Plan:  PAF: He has been compliant with renal dose Eliquis, no doses missed.  Scheduled for DCCV 10/19, MD advise if respiratory status will tolerate.  He is tolerating amiodarone at 200 mg twice daily   CAD: s/p CABG 2016 w/ subsequent PTCA PLA/PDA.  No ischemic symptoms, no ASA secondary to Eliquis, continue statin AS: At recent TEE, AS gradient was 24, this echo measured it at 21, suspect underestimated Lung Cancer: has had chemo, now on oral Lumakras (sotorasib),   CXR suggests pneumonia,continue Maxipime and metronidazole  CRF: Volume overloaded by exam.  Urine output 1200 cc on Lasix 60 mg IV daily.  BUN/creatinine slight trend up, continue to follow.  Weight down 14 pounds since admission   Prognosis seems somewhat guarded   Rosaria Ferries, PA-C 09/30/2021 9:41 AM   Looks better AFib rate not high less rhonchi on lung exam On renally dosed eliquis with no missed doses . Scheduled for 11:30 [place] in am  If pulmonary status continues to improve should be ok for procedure Good diuresis and weight down suspect will change to oral diuretics After [facility]  Jenkins Rouge MD [facility]

## 2021-09-30 NOTE — Progress Notes (Signed)
Patient's O2 sats dropped to high 70's - low 80's. Patient's respirations increased to 30's . Notified respiratory and MD

## 2021-09-30 NOTE — Progress Notes (Signed)
   09/30/21 1522  Mobility  Activity Contraindicated/medical hold (Per RN hold d/t low oxygen saturation)

## 2021-09-30 NOTE — Progress Notes (Signed)
Subjective: Notified by patient's RN about patient's increased work of breathing, oxygen requirement and respiratory distress. Patient was stable on 13 L by HFNC earlier in the day and told he started working with therapy, and desaturated to 60% with increased work of breathing and tachypnea.  He was placed on heated HFNC 40% and recovered to low 90s.  However, he continued to have increased work of breathing and tachypnea.  Patient denies chest pain.  He reports bilateral hip pain.  Patient's wife at bedside.  Objective: Vitals:   09/30/21 1206 09/30/21 1530 09/30/21 1543 09/30/21 1551  BP: 121/67  (!) 169/90   Pulse: 72  88 81  Resp: 18   (!) 36  Temp: 98.1 F (36.7 C)     TempSrc: Oral     SpO2: 92% 94%  100%  Weight:      Height:       GENERAL: No apparent distress.  Nontoxic. HEENT: MMM.  Vision grossly intact.  Hard of hearing. NECK: Supple.  Notable JVD even sitting upright. RESP: 92% on 40 L by heated HFNC.  Tachypneic to 30s and 40s.  Notable work of breathing.  Diminished aeration over right lung posteriorly.  Rhonchi bilaterally. CVS:  RRR. Heart sounds normal.  ABD/GI/GU: BS+. Abd soft, NTND.  MSK/EXT:  Moves extremities. 1+ edema bilaterally.  No pain with passive range of motion in his hips bilaterally SKIN: no apparent skin lesion or wound NEURO: Awake and alert. Oriented appropriately.  No apparent focal neuro deficit. PSYCH: Calm. Normal affect.   Labs and studies Labs and studies from this morning reviewed, and notable for Cr 1.78, BUN 41, Hgb 8.9, platelet 11.  Otherwise, CBC and BMP without significant finding.  BNP 723 improved from prior.  Assessment and plan Acute on chronic hypoxic respiratory failure/respiratory distress-likely from CHF and possible underlying obstructive etiology.  Reportedly desaturated to 60% with working with PT. Although recovered on heated high flow [place], he has significant tachypnea and increased work of breathing.  Rhonchorous on exam  with diminished aeration over right lung posteriorly.  Prominent JVD with 1+ pitting edema in BLE as well.  BP elevated to 169/90.  He is not tachycardic.  Already on Eliquis to suspect PE.  Has no chest pain to suggest ACS.  He is on broad-spectrum antibiotic for possible pneumonia. -Place patient on BiPAP. -IV Lasix 80 mg once.  He had 60 mg earlier this morning -IV Solu-Medrol 125 mg x 1, then 40 mg twice daily starting tomorrow -Continue breathing treatments and antibiotics (cefepime and Flagyl -Portable CXR, troponin, twelve-lead EKG and VBG -PCCM consult  Other medical issues -See progress note from this morning.  CRITICAL CARE Performed by: Mercy Riding   Total critical care time: 35 minutes  Critical care time was exclusive of separately billable procedures and treating other patients.  Critical care was necessary to treat or prevent imminent or life-threatening deterioration.  Critical care was time spent personally by me on the following activities: development of treatment plan with patient and/or surrogate as well as nursing, discussions with consultants, evaluation of patient's response to treatment, examination of patient, obtaining history from patient or surrogate, ordering and performing treatments and interventions, ordering and review of laboratory studies, ordering and review of radiographic studies, pulse oximetry and re-evaluation of patient's condition.

## 2021-09-30 NOTE — Progress Notes (Signed)
Physical Therapy Treatment Patient Details Name: Randall Wilkins MRN: [id number] DOB: 06-Feb-1941 Today's Date: 09/30/2021   History of Present Illness 80 year old male presents to the ER via EMS 09/26/21 with a 2 to 5-7-day history of worsening weakness resulting in fall when trying to when transferring from wheelchair back to bed. At baseline pt was ambulating with RW, Pt with 1 day history of shortness of breath. On arrival to the ER, sats were 88% on 6 L.  He dropped to 52% on room air. Wife states pt was scheduled for TEE/cardioversion due to afib next week. Admitted for treatment of Acute on chronic combined systolic and diastolic CHF. Pt requiring bipap 09/28/21  PMH: stage IV lung cancer, type 1 diabetes, obesity, chronic hypoxic respiratory failure, combined systolic diastolic heart failure, atrial fibrillation, chronic anticoagulation    PT Comments    Pt sitting up in chair on entry with son in room. Pt looks better today and reports feeling better. Pt limited in safe mobility by increased O2 demand (see General Comments) especially with mobility, and increased time for sequencing, in presence of generalized weakness, decreased balance and decreased endurance. Pt is min A for transfers and ambulation with RW. D/c plans remain appropriate at this time. PT will continue to follow acutely.      Recommendations for follow up therapy are one component of a multi-disciplinary discharge planning process, led by the attending physician.  Recommendations may be updated based on patient status, additional functional criteria and insurance authorization.  Follow Up Recommendations  Home health PT;Supervision/Assistance - 24 hour     Equipment Recommendations  None recommended by PT       Precautions / Restrictions Precautions Precautions: Fall Precaution Comments: fall prior to hospitalization Restrictions Weight Bearing Restrictions: No     Mobility  Bed Mobility                General bed mobility comments: pt up in recliner on entry    Transfers Overall transfer level: Needs assistance Equipment used: Rolling walker (2 wheeled) Transfers: Sit to/from Stand Sit to Stand: Min assist;From elevated surface         General transfer comment: good initial power up from recliner arms, pt requrires min A for powering up to RW and for bringing CoG over BoS, once balance achieved pt able to maintain  Ambulation/Gait Ambulation/Gait assistance: Min assist Gait Distance (Feet): 35 Feet Assistive device: Rolling walker (2 wheeled) Gait Pattern/deviations: Step-through pattern;Decreased step length - right;Decreased step length - left;Trunk flexed Gait velocity: slowed Gait velocity interpretation: <1.31 ft/sec, indicative of household ambulator General Gait Details: min A for steadying, increased cuing for navigation around obstacles in hallway         Balance Overall balance assessment: Needs assistance   Sitting balance-Leahy Scale: Fair     Standing balance support: Bilateral upper extremity supported;During functional activity Standing balance-Leahy Scale: Poor Standing balance comment: requires RW and min assist for dynamic balance and min guard for static                            Cognition Arousal/Alertness: Awake/alert Behavior During Therapy: WFL for tasks assessed/performed Overall Cognitive Status: Impaired/Different from baseline Area of Impairment: Memory;Following commands                     Memory: Decreased short-term memory Following Commands: Follows one step commands with increased time;Follows multi-step commands with increased time  General Comments: Pt continues to require increased cuing and processing time, combination of HOH and cognition         General Comments General comments (skin integrity, edema, etc.): Pt on 13 L O2 via HFNC, SaO2 96%O2, switched to 15L via tanked O2, with stop of  ambulation SaO2 had dropped to 67%O2, cued in purse lip breathing, led in continuing purse lip breathing requires approx 4 min to recover to 88-89 %SaO2. Pt son in room and assists with close chair follow      Pertinent Vitals/Pain Pain Assessment: No/denies pain Faces Pain Scale: No hurt     PT Goals (current goals can now be found in the care plan section) Acute Rehab PT Goals Patient Stated Goal: be able to walk again PT Goal Formulation: With patient/family Time For Goal Achievement: 10/12/21 Potential to Achieve Goals: Fair Progress towards PT goals: Progressing toward goals    Frequency    Min 3X/week      PT Plan Current plan remains appropriate       AM-PAC PT "6 Clicks" Mobility   Outcome Measure  Help needed turning from your back to your side while in a flat bed without using bedrails?: A Little Help needed moving from lying on your back to sitting on the side of a flat bed without using bedrails?: A Little Help needed moving to and from a bed to a chair (including a wheelchair)?: A Little Help needed standing up from a chair using your arms (e.g., wheelchair or bedside chair)?: A Little Help needed to walk in hospital room?: A Little Help needed climbing 3-5 steps with a railing? : Total 6 Click Score: 16    End of Session Equipment Utilized During Treatment: Gait belt;Oxygen Activity Tolerance: Treatment limited secondary to medical complications (Comment);Patient tolerated treatment well Patient left: in chair;with chair alarm set;with family/visitor present;with nursing/sitter in room Nurse Communication: Mobility status PT Visit Diagnosis: Unsteadiness on feet (R26.81);Repeated falls (R29.6);Muscle weakness (generalized) (M62.81);History of falling (Z91.81);Difficulty in walking, not elsewhere classified (R26.2)     Time: 7588-3254 PT Time Calculation (min) (ACUTE ONLY): 28 min  Charges:  $Gait Training: 8-22 mins $Therapeutic Activity: 8-22  mins                     Darlin Stenseth B. Migdalia Dk PT, DPT Acute Rehabilitation Services Pager [phone] Office [phone]    [place] 09/30/2021, 3:06 PM

## 2021-09-30 NOTE — Consult Note (Signed)
   [facility] CM Inpatient Consult   09/30/2021  [name] 09/22/1941 [id number]  [place] [facility] [ACO] Patient: [name] Medicare  Primary Care Provider:  [name], MD [facility] Physician is listed to provide [facility] follow up   Patient screened for hospitalization with noted extreme high risk score for unplanned readmission risk and  to assess for potential [place] Management service needs for post hospital transition.  Review of patient's medical record reveals patient is being followed by palliative care and notes reviewed for post hospital needs and to be followed with outpatient palliative, currently.  Patient also had been followed per notes with [place] prior to admission.  2:30 pm up in the hall with therapy and son with chair behind him.   Plan:  Continue to follow for progress for transition of care.  For questions contact:   Natividad Brood, RN BSN [facility] Liaison  [phone] business mobile phone Toll free office [phone]  Fax number: [phone] [email or web address]

## 2021-09-30 NOTE — Progress Notes (Signed)
RT NOTES: Called to patient's room d/t patient desaturating to the 32s. Placed patient on heated high flow nasal cannula. Sats improved to 94% but patient still tacypneic with labored breathing. Pt has expiratory wheezes. Breathing treatment administered. No improvement in work of breathing. Placed patient on bipap, 14/7 100%. Work of breathing is slowly decreasing, sats 100%. Will continue to monitor and wean as able.

## 2021-09-30 NOTE — Consult Note (Signed)
NAME:  Randall Wilkins, MRN:  [id number], DOB:  12/02/1941, LOS: 3 ADMISSION DATE:  09/26/2021, CONSULTATION DATE:  10/18 REFERRING MD:  Cyndia Skeeters, CHIEF COMPLAINT:  respiratory failure    History of Present Illness:  This is a 80 year old male patient with a complicated medical history as mentioned below Who was admitted on 10/15 with chief complaint of weakness and shortness of breath.  Which had had progressed from initial weakness to about 1 day history of worsening shortness of breath.  He had been seen in the outpatient setting and treated for a urinary tract infection initially felt possibly being the etiology of his weakness however as his symptoms got worse he got to the point where he could no longer walk, and his wife had to push him around in a wheelchair.  On the day of admission his spouse noticed he was working hard to breathe, he was on his typical 2 L of oxygen.  EMS was called, on arrival to emergency room he required up to 6 L to maintain saturations greater than 88% in room air saturations were 52%.  His BNP was 1183, initial chest x-ray demonstrated pulmonary edema he was admitted to the internal medicine service with a working diagnosis of acute combined systolic and diastolic heart failure with a resultant pulmonary edema and respiratory failure.  Therapeutic interventions included: IV Lasix, noninvasive positive pressure ventilation and hospital admission. Hospital course: 10/16 cardiology and palliative care consulted.  Did have increased oxygen requirements and worsening chest x-ray, also spiked fever of 102.3 and therefore empiric cefepime and Flagyl added.  DNR status established 10/18 had been doing a little bit better.  No major issues overnight.  Weaned down to 13 L high flow oxygen but still having increased work of breathing.  Later in the afternoon on 10/18 had acute worsening of respiratory failure with saturations down to 70, initially placed on high flow however remained  quite short of breath and therefore placed on BiPAP.  Pulmonary medicine asked to evaluate to see if anything else could potentially be added to his care  Pertinent  Medical History  Combined heart failure, stage IV lung cancer, chronic hypoxic respiratory failure typically on 2 L, pulmonary artery hypertension, atrial fibrillation/flutter on Eliquis, coronary artery disease, diabetes type 1, obesity, generalized weakness.Obstructive sleep apnea on CPAP.  Coronary artery disease and prior CABG  Significant Hospital Events: Including procedures, antibiotic start and stop dates in addition to other pertinent events   10/16 cardiology and palliative care consulted.  Did have increased oxygen requirements and worsening chest x-ray, also spiked fever of 102.3 and therefore empiric cefepime and Flagyl added.  DNR status established 10/18 had been doing a little bit better.  No major issues overnight.  Weaned down to 13 L high flow oxygen but still having increased work of breathing.  Later in the afternoon on 10/18 had acute worsening of respiratory failure with saturations down to 70, initially placed on high flow however remained quite short of breath and therefore placed on BiPAP.  Pulmonary medicine asked to evaluate to see if anything else could potentially be added to his care  Interim History / Subjective:  Now on BIPAP   Objective   Blood pressure (Abnormal) 169/90, pulse 81, temperature 98.1 F (36.7 C), temperature source Oral, resp. rate (Abnormal) 23, height 5' 7" (1.702 m), weight 94.6 kg, SpO2 100 %.    FiO2 (%):  [70 %-100 %] 70 %   Intake/Output Summary (Last 24 hours) at 09/30/2021  [place] filed at 09/30/2021 0829 Gross per 24 hour  Intake 1117.48 ml  Output 1200 ml  Net -82.52 ml   Filed Weights   09/27/21 0547 09/29/21 0343 09/30/21 0407  Weight: 97.5 kg 97.5 kg 94.6 kg    Examination: General: chronically ill appearing WM sitting up in chair. Now on BIPAP and  feeling better HENT: NCAT  Lungs: crackles R>L  Portable chest x-ray personally reviewed Persistent right greater than left airspace disease cannot exclude element of pleural effusion Cardiovascular: regular rhythm. Systolic HM c/w AS Abdomen: soft not tender  Extremities: chronic venous stasis changes w/ 4 + edema Neuro: awake and oriented  GU: voids  Resolved Hospital Problem list     Assessment & Plan:   Chronic hypoxic respiratory failure Stage IV lung cancer; adenocarcinoma Acute systolic and diastolic heart failure Aortic stenosis Acute pulmonary edema Pulmonary hypertension Chronic atrial fibrillation/atrial flutter on Eliquis Obesity with history of obstructive sleep apnea CKD stage IV Anemia of chronic disease Mild thrombocytopenia Hypothyroidis History of coronary artery disease with prior CABG and PTCA Hyperglycemia   Acute on Chronic hypoxic respiratory failure, multifactorial: Pulmonary edema 2/2 acute systolic/diastolic HF, w/ severe AS, +/- PNA superimposed on h/o lung cancer. At this point really little to offer. The treatable issues being HF and PNA. Unfortunately really little to offer here.  Plan Cont IV diuresis (his AS will be limiting factor here)  Cont BIPAP and oxygen He should be on BIPAP at HS to help unload his heart. Was on CPAP at  home.  Abx per primary team (not sure if he's infected but possible) Steroids started. (Doubt will help but if this is lymphangitic spread might help)  There may be really nothing else to offer here other than comfort.     Best Practice (right click and "Reselect all SmartList Selections" daily)   Per primary Labs   CBC: Recent Labs  Lab 09/27/21 0020 09/27/21 0407 09/28/21 0449 09/29/21 0622 09/30/21 0515  WBC 8.1 6.9 8.2 9.8 7.2  NEUTROABS 6.3 4.7  --   --   --   HGB 8.7* 8.8* 9.8* 9.7* 8.9*  HCT 29.4* 28.8* 32.4* 31.6* 29.9*  MCV 96.4 95.0 94.5 93.5 95.2  PLT 121* 96* 102* 110* 110*    Basic  Metabolic Panel: Recent Labs  Lab 09/27/21 0407 09/28/21 0449 09/28/21 0930 09/29/21 0703 09/30/21 0515  NA 137 138 139 136 139  K 4.3 3.7 3.6 3.7 3.7  CL 105 103 102 102 104  CO2 _0 GLUCOSE 169* 105* 199* 141* 142*  BUN 38* 35* 35* 36* 41*  CREATININE 2.27* 1.99* 1.86* 1.76* 1.78*  CALCIUM 8.4* 8.5* 8.5* 8.6* 8.6*  MG 2.2 2.0  --  2.3 2.0  PHOS  --  4.3  --  4.2 3.7   GFR: Estimated Creatinine Clearance: 36.3 mL/min (A) (by C-G formula based on SCr of 1.78 mg/dL (H)). Recent Labs  Lab 09/27/21 0021 09/27/21 0053 09/27/21 0407 09/28/21 0449 09/29/21 0622 09/30/21 0515  WBC  --   --  6.9 8.2 9.8 7.2  LATICACIDVEN 1.0 1.3  --   --   --   --     Liver Function Tests: Recent Labs  Lab 09/27/21 0020 09/27/21 0407 09/28/21 0449 09/29/21 0703 09/30/21 0515  AST 17 15  --   --   --   ALT 11 8  --   --   --   ALKPHOS 92 89  --   --   --  BILITOT 0.9 0.7  --   --   --   PROT 5.8* 5.6*  --   --   --   ALBUMIN 2.8* 2.6* 2.7* 2.6* 2.2*   No results for input(s): LIPASE, AMYLASE in the last 168 hours. No results for input(s): AMMONIA in the last 168 hours.  ABG    Component Value Date/Time   PHART 7.446 09/26/2021 2354   PCO2ART 37.5 09/26/2021 2354   PO2ART 90 09/26/2021 2354   HCO3 25.2 09/28/2021 1658   TCO2 27 09/26/2021 2354   ACIDBASEDEF 3.0 (H) 03/30/2015 0110   O2SAT 84.2 09/28/2021 1658     Coagulation Profile: Recent Labs  Lab 09/27/21 0020  INR 1.8*    Cardiac Enzymes: Recent Labs  Lab 09/28/21 0449  CKTOTAL 38*    HbA1C: Hemoglobin A1C  Date/Time Value Ref Range Status  09/09/2021 10:22 AM 6.6 (A) 4.0 - 5.6 % Final  02/13/2019 12:00 AM 7.1  Final   Hgb A1c MFr Bld  Date/Time Value Ref Range Status  05/02/2021 01:53 AM 6.0 (H) 4.8 - 5.6 % Final    Comment:    (NOTE) Pre diabetes:          5.7%-6.4%  Diabetes:              >6.4%  Glycemic control for   <7.0% adults with diabetes   02/26/2021 03:14 PM 6.7 (H) 4.8  - 5.6 % Final    Comment:    (NOTE) Pre diabetes:          5.7%-6.4%  Diabetes:              >6.4%  Glycemic control for   <7.0% adults with diabetes     CBG: Recent Labs  Lab 09/29/21 1140 09/29/21 1620 09/29/21 2122 09/30/21 0606 09/30/21 1208  GLUCAP 119* 198* 160* 136* 223*    Review of Systems:   Review of Systems  Constitutional:  Positive for malaise/fatigue. Negative for chills and fever.  HENT: Negative.    Eyes: Negative.   Respiratory:  Positive for shortness of breath.   Cardiovascular:  Positive for orthopnea and leg swelling.  Gastrointestinal: Negative.   Genitourinary: Negative.   Musculoskeletal: Negative.   Skin: Negative.   Neurological: Negative.   Endo/Heme/Allergies: Negative.   Psychiatric/Behavioral: Negative.      Past Medical History:  He,  has a past medical history of Aortic stenosis, mild (11/14/2013), Aortic valve sclerosis (03/29/2015), Bilateral leg edema (05/21/2014), CAD (coronary artery disease), Chronic diastolic congestive heart failure ([place]), Chronic kidney disease (CKD), stage III (moderate) ([place]), Diabetes 1.5, managed as type 1 ([place]) (02/04/2013), Diabetic peripheral neuropathy associated with type 1 diabetes mellitus ([place]) (11/14/2013), Dysphagia, Dyspnea on exertion (03/21/2015), Exogenous obesity, Gout, Heart attack ([place]), History of nuclear stress test (07/2011), Hyperlipidemia, Hypertension, Hypothyroidism, Insulin dependent diabetes mellitus, Left foot drop, Left main coronary artery disease (03/28/2015), lung ca (dx'd 08/2020), Macular pucker, right eye (01/29/2021), Memory loss, Obesity (BMI 30-39.9) (11/14/2013), Obstructive sleep apnea (03/21/2015), OSA on CPAP, Peptic ulcer with hemorrhage (03/28/2015), Peripheral neuropathy, Pneumonia, Rhabdomyolysis, S/P CABG x 2 (03/29/2015), Stroke ([place]), Thrombocytopenia ([place]) (03/21/2015), Venous insufficiency, and Weakness generalized (03/21/2015).   Surgical History:   Past Surgical History:   Procedure Laterality Date   BACK SURGERY  2002   lumbosacral. 11 back surgeries total   BRONCHIAL BIOPSY  08/06/2020   Procedure: BRONCHIAL BIOPSIES;  Surgeon: Collene Gobble, MD;  Location[facility] ENDOSCOPY;  Service: Pulmonary;;   BRONCHIAL BRUSHINGS  08/06/2020  Procedure: BRONCHIAL BRUSHINGS;  Surgeon: Collene Gobble, MD;  [facility];  Service: Pulmonary;;   BRONCHIAL NEEDLE ASPIRATION BIOPSY  08/06/2020   Procedure: BRONCHIAL NEEDLE ASPIRATION BIOPSIES;  Surgeon: Collene Gobble, MD;  Location: [facility] ENDOSCOPY;  Service: Pulmonary;;   CARDIOVERSION N/A 05/06/2021   Procedure: CARDIOVERSION;  Surgeon: Geralynn Rile, MD;  Location: [place];  Service: Cardiovascular;  Laterality: N/A;   Carotid Doppler  03/2013   bilat bulb/prox ICAs - mild amount of fibrous plaque with no evidence of diameter reduction   CARPAL TUNNEL RELEASE Bilateral 08/09/2014   Procedure: BILATERAL CARPAL TUNNEL RELEASE;  Surgeon: Daryll Brod, MD;  Location: [place];  Service: Orthopedics;  Laterality: Bilateral;  ANESTHESIA:  IV REGIONAL BIL FAB   CHOLECYSTECTOMY     COLONOSCOPY     CORONARY ANGIOPLASTY  10/13/1996   CORONARY ANGIOPLASTY  09/21/1989   emergency PTCA   CORONARY ANGIOPLASTY  10/13/1996   Multi-Link diagonal & OD stenting (Dr. Marella Chimes)   [place]  12/03/1997   disease of mid DX-1 ~50% & in mid PLA & PDA (distal lesions) (Dr. Marella Chimes)    CORONARY ANGIOPLASTY  10/14/1999   progression of disease distal PLA & PDA; progression of disease prox RCA - moderate (Dr. Marella Chimes)    CORONARY ANGIOPLASTY WITH STENT PLACEMENT  04/04/2004   4.0x35m non-DES (thrombectomy via AngioJet) to RCA for high grade stenosis (Dr. RMarella Chimes   CORONARY ARTERY BYPASS GRAFT N/A 03/29/2015   Procedure: CORONARY ARTERY BYPASS GRAFTING TIMES TWO USING LEFT INTERNAL MAMMARY ARTERY AND RIGHT LEG GREATER SAPHENOUS VEIN HARVESTED ENDOSCOPICALLY.;  Surgeon: CRexene Alberts MD;  [place]  Service: Open Heart Surgery;  Laterality: N/A;   ESOPHAGOGASTRODUODENOSCOPY N/A 03/27/2015   Procedure: ESOPHAGOGASTRODUODENOSCOPY (EGD);  Surgeon: MClarene Essex MD;  [facility];  Service: Endoscopy;  Laterality: N/A;  possible dilation   ESOPHAGOGASTRODUODENOSCOPY N/A 10/05/2020   Procedure: ESOPHAGOGASTRODUODENOSCOPY (EGD);  Surgeon: OArta Silence MD;  [facility];  Service: Endoscopy;  Laterality: N/A;   EYE SURGERY Right    macular fold-02/2021   FINE NEEDLE ASPIRATION  08/06/2020   Procedure: FINE NEEDLE ASPIRATION (FNA) LINEAR;  Surgeon: BCollene Gobble MD;  [facility];  Service: Pulmonary;;   IR IMAGING GUIDED PORT INSERTION  10/07/2020   LEFT HEART CATHETERIZATION WITH CORONARY ANGIOGRAM N/A 03/28/2015   Procedure: LEFT HEART CATHETERIZATION WITH CORONARY ANGIOGRAM;  Surgeon: Verbie Babic M JMartinique MD;  [facility];  Service: Cardiovascular;  Laterality: N/A;   SINUS ENDO W/FUSION     TEE WITHOUT CARDIOVERSION N/A 05/06/2021   Procedure: TRANSESOPHAGEAL ECHOCARDIOGRAM (TEE);  Surgeon: OGeralynn Rile MD;  [place] [facility]  Service: Cardiovascular;  Laterality: N/A;   TONSILLECTOMY     TRANSTHORACIC ECHOCARDIOGRAM  08/08/2013   EF 55-60%, mild conc hypertrophy, grade 1 diastolic dysfunction; AV with mild stenosis; LA & RA mildly dilated   VIDEO BRONCHOSCOPY WITH ENDOBRONCHIAL NAVIGATION N/A 08/06/2020   Procedure: VIDEO BRONCHOSCOPY WITH ENDOBRONCHIAL NAVIGATION;  Surgeon: BCollene Gobble MD;  [facility];  Service: Pulmonary;  Laterality: N/A;   VIDEO BRONCHOSCOPY WITH ENDOBRONCHIAL ULTRASOUND N/A 08/06/2020   Procedure: VIDEO BRONCHOSCOPY WITH ENDOBRONCHIAL ULTRASOUND;  Surgeon: BCollene Gobble MD;  [facility];  Service: Pulmonary;  Laterality: N/A;     Social History:   reports that he quit smoking about 47 years ago. His smoking use included cigarettes. He has a 40.00  pack-year smoking history. He has never used  smokeless tobacco. He reports that he does not drink alcohol and does not use drugs.   Family History:  His family history includes Cancer in his maternal grandmother; Coronary artery disease in his father; Diabetes in his son; Heart Problems in his maternal grandfather; Heart disease in his mother.   Allergies Allergies  Allergen Reactions   Actos [Pioglitazone] Swelling   Spironolactone Itching   Metformin And Related Nausea Only    3.16.2022 pt is currently taking this medication at home   Niaspan [Niacin Er] Itching and Rash   Wound Dressing Adhesive Rash     Home Medications  Prior to Admission medications   Medication Sig Start Date End Date Taking? Authorizing Provider  acetaminophen (TYLENOL) 500 MG tablet Take 1,000 mg by mouth every 6 (six) hours as needed for moderate pain or headache.   Yes [provider]  albuterol (VENTOLIN HFA) 108 (90 Base) MCG/ACT inhaler Inhale 2 puffs into the lungs every 6 (six) hours as needed for wheezing or shortness of breath.   Yes [provider]  allopurinol (ZYLOPRIM) 100 MG tablet TAKE ONE TABLET EVERY DAY Patient taking differently: Take 100 mg by mouth daily. 05/14/21  Yes Hilty, Nadean Corwin, MD  amiodarone (PACERONE) 200 MG tablet Take 1 tablet (200 mg total) by mouth daily. Patient taking differently: Take 200 mg by mouth 2 (two) times daily. 09/02/21  Yes Camnitz, Will Hassell Done, MD  amitriptyline (ELAVIL) 50 MG tablet Take 50 mg by mouth at bedtime. 06/11/21  Yes [provider]  amLODipine (NORVASC) 10 MG tablet TAKE ONE TABLET BY MOUTH ONCE DAILY Patient taking differently: Take 10 mg by mouth daily. 05/13/21  Yes Hilty, Nadean Corwin, MD  apixaban (ELIQUIS) 5 MG TABS tablet Take 1 tablet (5 mg total) by mouth 2 (two) times daily. 03/11/21  Yes Hilty, Nadean Corwin, MD  Blood Glucose Monitoring Suppl (CONTOUR NEXT MONITOR) w/Device KIT 1 Device by Does not apply route 3 (three)  times daily. Use to check blood sugars 3 times daily. Dx Code E13.9 07/29/17  Yes Elayne Snare, MD  cephALEXin (KEFLEX) 500 MG capsule Take 500 mg by mouth See admin instructions. Tid x 7 days 09/24/21  Yes [provider]  cetirizine (ZYRTEC) 10 MG tablet Take 10 mg by mouth daily.   Yes [provider]  colchicine 0.6 MG tablet Take 0.6 mg by mouth 3 (three) times daily as needed (gout flares).   Yes [provider]  CONTOUR NEXT TEST test strip USE 1 STRIP TO CHECK GLUCOSE 4 TIMES DAILY Patient taking differently: USE 1 STRIP TO CHECK GLUCOSE 3 TIMES DAILY 08/25/21  Yes Elayne Snare, MD  furosemide (LASIX) 40 MG tablet Take 2 tablets (80 mg total) by mouth 2 (two) times daily. Patient taking differently: Take 80 mg by mouth in the morning and at bedtime. 8:30 am and 2 pm 07/01/21  Yes Hilty, Nadean Corwin, MD  hydrALAZINE (APRESOLINE) 25 MG tablet Take 37.5 mg by mouth in the morning and at bedtime.   Yes [provider]  insulin degludec (TRESIBA FLEXTOUCH) 200 UNIT/ML FlexTouch Pen Inject 12 Units into the skin at bedtime. Patient taking differently: Inject 6 Units into the skin at bedtime. 05/07/21  Yes Enzo Bi, MD  insulin lispro (HUMALOG) 100 UNIT/ML cartridge Inject 5-15 Units into the skin 3 (three) times daily with meals. Sliding scale.   Yes [provider]  Insulin Pen Needle (EASY COMFORT PEN NEEDLES) 33G X 4 MM MISC 1 each by Does  not apply route See admin instructions. Use to inject insulin 5 times daily. 05/31/19  Yes Elayne Snare, MD  isosorbide dinitrate (ISORDIL) 20 MG tablet Take 1 tablet (20 mg total) by mouth in the morning and at bedtime. 05/08/21  Yes Hilty, Nadean Corwin, MD  levothyroxine (SYNTHROID) 88 MCG tablet Take 88 mcg by mouth every morning. 09/12/21  Yes [provider]  lidocaine-prilocaine (EMLA) cream APPLY A TEASPOON OVER PORT SITE AT [place] BEFORE LAB APPOINTMENT. DO NOT RUB IN AND COVER WITH PLASTIC WRAP Patient  taking differently: Apply 1 application topically See admin instructions. Apply a teaspoon over port site at least one hour before lab appointment. Do not rub in and cover with plastic wrap. 04/08/21  Yes Curt Bears, MD  lisinopril (ZESTRIL) 10 MG tablet Take 1 tablet (10 mg total) by mouth daily. 05/26/21  Yes Elayne Snare, MD  LUMAKRAS 120 MG TABS Take 8 tablets (942m) by mouth daily at one time as directed by physician Patient taking differently: Take 960 mg by mouth daily. 09/10/21  Yes MCurt Bears MD  ondansetron (ZOFRAN) 4 MG tablet Take 1 tablet (4 mg total) by mouth every 6 (six) hours as needed for nausea. 10/08/20  Yes Sheikh, Omair Latif, DO  OXYGEN Inhale 2 L into the lungs continuous.   Yes [provider]  potassium chloride SA (K-DUR,KLOR-CON) 20 MEQ tablet Take 20 mEq by mouth 2 (two) times daily.    Yes [provider]  prochlorperazine (COMPAZINE) 10 MG tablet Take 1 tablet (10 mg total) by mouth every 6 (six) hours as needed for nausea or vomiting. 09/02/20  Yes MCurt Bears MD  rosuvastatin (CRESTOR) 20 MG tablet Take 20 mg by mouth daily.   Yes [provider]  Tamsulosin HCl (FLOMAX) 0.4 MG CAPS Take 0.4 mg by mouth at bedtime.   Yes [provider]  vitamin B-12 (CYANOCOBALAMIN) 1000 MCG tablet Take 1,000 mcg by mouth at bedtime.   Yes [provider]     Critical care time: NA    PErick ColaceACNP-BC LBrownsvillePager # [phone] # [phone] no answer

## 2021-10-01 ENCOUNTER — Other Ambulatory Visit (HOSPITAL_COMMUNITY): Payer: Medicare Other | Admitting: Nurse Practitioner

## 2021-10-01 ENCOUNTER — Encounter (HOSPITAL_COMMUNITY)
Admission: EM | Disposition: A | Discharge status: Hospice | Payer: Self-pay | Source: Home / Self Care | Attending: Internal Medicine

## 2021-10-01 ENCOUNTER — Ambulatory Visit (HOSPITAL_COMMUNITY): Admission: RE | Admit: 2021-10-01 | Payer: Medicare Other | Source: Ambulatory Visit | Admitting: Cardiology

## 2021-10-01 DIAGNOSIS — C3491 Malignant neoplasm of unspecified part of right bronchus or lung: Secondary | ICD-10-CM | POA: Diagnosis not present

## 2021-10-01 DIAGNOSIS — I5043 Acute on chronic combined systolic (congestive) and diastolic (congestive) heart failure: Secondary | ICD-10-CM | POA: Diagnosis not present

## 2021-10-01 DIAGNOSIS — J9601 Acute respiratory failure with hypoxia: Secondary | ICD-10-CM | POA: Diagnosis not present

## 2021-10-01 DIAGNOSIS — I4892 Unspecified atrial flutter: Secondary | ICD-10-CM | POA: Diagnosis not present

## 2021-10-01 DIAGNOSIS — I4891 Unspecified atrial fibrillation: Secondary | ICD-10-CM | POA: Diagnosis not present

## 2021-10-01 LAB — RENAL FUNCTION PANEL
Albumin: 2.2 g/dL — ABNORMAL LOW (ref 3.5–5.0)
Anion gap: 9 (ref 5–15)
BUN: 48 mg/dL — ABNORMAL HIGH (ref 8–23)
CO2: 23 mmol/L (ref 22–32)
Calcium: 8.2 mg/dL — ABNORMAL LOW (ref 8.9–10.3)
Chloride: 101 mmol/L (ref 98–111)
Creatinine, Ser: 1.93 mg/dL — ABNORMAL HIGH (ref 0.61–1.24)
GFR, Estimated: 35 mL/min — ABNORMAL LOW (ref >60)
Glucose, Bld: 223 mg/dL — ABNORMAL HIGH (ref 70–99)
Phosphorus: 4.5 mg/dL (ref 2.5–4.6)
Potassium: 4.1 mmol/L (ref 3.5–5.1)
Sodium: 133 mmol/L — ABNORMAL LOW (ref 135–145)

## 2021-10-01 LAB — HEMOGLOBIN AND HEMATOCRIT, BLOOD
HCT: 29.6 % — ABNORMAL LOW (ref 39.0–52.0)
Hemoglobin: 8.8 g/dL — ABNORMAL LOW (ref 13.0–17.0)

## 2021-10-01 LAB — GLUCOSE, CAPILLARY
Glucose-Capillary: 224 mg/dL — ABNORMAL HIGH (ref 70–99)
Glucose-Capillary: 211 mg/dL — ABNORMAL HIGH (ref 70–99)
Glucose-Capillary: 196 mg/dL — ABNORMAL HIGH (ref 70–99)
Glucose-Capillary: 195 mg/dL — ABNORMAL HIGH (ref 70–99)

## 2021-10-01 LAB — MAGNESIUM: Magnesium: 2.2 mg/dL (ref 1.7–2.4)

## 2021-10-01 SURGERY — CARDIOVERSION
Anesthesia: Monitor Anesthesia Care

## 2021-10-01 MED ORDER — FUROSEMIDE 20 MG PO TABS
20.0000 mg | ORAL_TABLET | Freq: Every day | ORAL | Status: DC
  Administered 2021-10-02 – 2021-10-06 (×5): 20 mg via ORAL
  Filled 2021-10-01 (×5): qty 1

## 2021-10-01 NOTE — Plan of Care (Signed)
  Problem: Clinical Measurements: Goal: Will remain free from infection Outcome: Progressing   Problem: Coping: Goal: Level of anxiety will decrease Outcome: Progressing   Problem: Safety: Goal: Ability to remain free from injury will improve Outcome: Progressing   

## 2021-10-01 NOTE — Progress Notes (Signed)
Subjective:   Still requiring high flow O2 looks comfortable Discussed cancelling [facility] given overall pulmonary  Status and good rate control    Objective:  Vitals:   10/01/21 0731 10/01/21 0732 10/01/21 0732 10/01/21 0758  BP:   (!) 128/59   Pulse: 62  61 (!) 58  Resp: (!) 32   (!) 22  Temp:      TempSrc:      SpO2: 100% 100% 100% 96%  Weight:      Height:        Intake/Output from previous day:  Intake/Output Summary (Last 24 hours) at 10/01/2021 0955 Last data filed at 10/01/2021 0350 Gross per 24 hour  Intake 355.78 ml  Output 1200 ml  Net -844.22 ml    Physical Exam:  GEN: No acute distress.   Neck: JVD 10 CM Cardiac: RRR, 3/6 murmur, no rubs, or gallops.  Respiratory: diminished to auscultation bilaterally with rales  GI: Soft, nontender, non-distended  MS: 2+ LE edema; No deformity. Neuro:  Nonfocal  Psych: Normal affect    Lab Results: Basic Metabolic Panel: pending for 10/17 Recent Labs    09/30/21 0515 10/01/21 0529  NA 139 133*  K 3.7 4.1  CL 104 101  CO2 26 23  GLUCOSE 142* 223*  BUN 41* 48*  CREATININE 1.78* 1.93*  CALCIUM 8.6* 8.2*  MG 2.0 2.2  PHOS 3.7 4.5   Liver Function Tests: Recent Labs    09/30/21 0515 10/01/21 0529  ALBUMIN 2.2* 2.2*   No results for input(s): LIPASE, AMYLASE in the last 72 hours. CBC: Recent Labs    09/29/21 0622 09/30/21 0515 10/01/21 0529  WBC 9.8 7.2  --   HGB 9.7* 8.9* 8.8*  HCT 31.6* 29.9* 29.6*  MCV 93.5 95.2  --   PLT 110* 110*  --    Cardiac Enzymes: No results for input(s): CKTOTAL, CKMB, CKMBINDEX, TROPONINI in the last 72 hours.  BNP    Component Value Date/Time   BNP 723.3 (H) 09/30/2021 0515    Imaging: DG Chest Port 1 View  Result Date: 09/30/2021 CLINICAL DATA:  Dyspnea EXAM: PORTABLE CHEST 1 VIEW COMPARISON:  09/28/2021, 09/26/2021, CT 09/09/2001 FINDINGS: Right-sided central venous port tip over the cavoatrial region. Cardiomegaly with vascular congestion. Suspected  bilateral pleural effusions. Similar appearance of bilateral ground-glass opacities and patchy consolidations. No visible pneumothorax. IMPRESSION: Cardiomegaly with vascular congestion and suspected pleural effusions. Similar appearing bilateral ground-glass disease and patchy areas of consolidation Electronically Signed   By: Donavan Foil M.D.   On: 09/30/2021 16:32   ECHOCARDIOGRAM COMPLETE  Result Date: 09/29/2021    ECHOCARDIOGRAM REPORT   Patient Name:   Randall Wilkins Date of Exam: 09/29/2021 Medical Rec #:  [id number]      Height:       67.0 in Accession #:    [id number]     Weight:       214.9 lb Date of Birth:  17-Apr-1941      BSA:          2.085 m Patient Age:    39 years       BP:           120/58 mmHg Patient Gender: M              HR:           69 bpm. Exam Location:  Inpatient Procedure: 2D Echo, Color Doppler and Cardiac Doppler Indications:    K53.97 Acute systolic (congestive)  heart failure  History:        Patient has prior history of Echocardiogram examinations, most                 recent 05/06/2021. CHF, Prior CABG; Risk Factors:Hypertension,                 Diabetes and Dyslipidemia.  Sonographer:    Raquel Sarna Senior RDCS Referring Phys: [place]  1. Left ventricular ejection fraction, by estimation, is 45 to 50%. The left ventricle has mildly decreased function. The left ventricle demonstrates global hypokinesis. There is mild left ventricular hypertrophy. Left ventricular diastolic parameters are consistent with Grade III diastolic dysfunction (restrictive). There is the interventricular septum is flattened in systole and diastole, consistent with right ventricular pressure and volume overload.  2. Right ventricular systolic function is normal. The right ventricular size is normal. There is moderately elevated pulmonary artery systolic pressure. The estimated right ventricular systolic pressure is 91.4 mmHg.  3. The mitral valve is normal in structure. Mild mitral valve  regurgitation. No evidence of mitral stenosis.  4. Tricuspid valve regurgitation is severe.  5. The aortic valve is calcified. There is severe calcifcation of the aortic valve. There is severe thickening of the aortic valve. Aortic valve regurgitation is trivial. Moderate aortic valve stenosis. Aortic valve mean gradient measures 21.0 mmHg. Aortic valve Vmax measures 3.53 m/s.  6. The inferior vena cava is normal in size with greater than 50% respiratory variability, suggesting right atrial pressure of 3 mmHg. Comparison(s): Prior mean aortic valve gradient 17.5 mmHg. FINDINGS  Left Ventricle: Left ventricular ejection fraction, by estimation, is 45 to 50%. The left ventricle has mildly decreased function. The left ventricle demonstrates global hypokinesis. The left ventricular internal cavity size was normal in size. There is  mild left ventricular hypertrophy. The interventricular septum is flattened in systole and diastole, consistent with right ventricular pressure and volume overload. Left ventricular diastolic parameters are consistent with Grade III diastolic dysfunction (restrictive). Right Ventricle: The right ventricular size is normal. No increase in right ventricular wall thickness. Right ventricular systolic function is normal. There is moderately elevated pulmonary artery systolic pressure. The tricuspid regurgitant velocity is 3.28 m/s, and with an assumed right atrial pressure of 8 mmHg, the estimated right ventricular systolic pressure is 78.2 mmHg. Left Atrium: Left atrial size was normal in size. Right Atrium: Right atrial size was normal in size. Pericardium: There is no evidence of pericardial effusion. Mitral Valve: The mitral valve is normal in structure. Mild mitral valve regurgitation. No evidence of mitral valve stenosis. MV peak gradient, 8.4 mmHg. The mean mitral valve gradient is 3.0 mmHg. Tricuspid Valve: The tricuspid valve is normal in structure. Tricuspid valve regurgitation is  severe. No evidence of tricuspid stenosis. Aortic Valve: The aortic valve is calcified. There is severe calcifcation of the aortic valve. There is severe thickening of the aortic valve. Aortic valve regurgitation is trivial. Moderate aortic stenosis is present. Aortic valve mean gradient measures  21.0 mmHg. Aortic valve peak gradient measures 49.8 mmHg. Aortic valve area, by VTI measures 0.70 cm. Pulmonic Valve: The pulmonic valve was normal in structure. Pulmonic valve regurgitation is not visualized. No evidence of pulmonic stenosis. Aorta: The aortic root is normal in size and structure. Venous: The inferior vena cava is normal in size with greater than 50% respiratory variability, suggesting right atrial pressure of 3 mmHg. IAS/Shunts: No atrial level shunt detected by color flow Doppler.  LEFT VENTRICLE PLAX 2D LVIDd:  4.60 cm   Diastology LVIDs:         3.70 cm   LV e' medial:    9.11 cm/s LV PW:         1.30 cm   LV E/e' medial:  14.3 LV IVS:        1.20 cm   LV e' lateral:   11.30 cm/s LVOT diam:     1.90 cm   LV E/e' lateral: 11.5 LV SV:         54 LV SV Index:   26 LVOT Area:     2.84 cm  RIGHT VENTRICLE RV S prime:     10.40 cm/s TAPSE (M-mode): 1.6 cm LEFT ATRIUM             Index        RIGHT ATRIUM           Index LA diam:        3.80 cm 1.82 cm/m   RA Area:     22.40 cm LA Vol (A2C):   62.4 ml 29.93 ml/m  RA Volume:   68.40 ml  32.80 ml/m LA Vol (A4C):   49.3 ml 23.64 ml/m LA Biplane Vol: 55.5 ml 26.62 ml/m  AORTIC VALVE AV Area (Vmax):    0.80 cm AV Area (Vmean):   1.06 cm AV Area (VTI):     0.70 cm AV Vmax:           353.00 cm/s AV Vmean:          198.000 cm/s AV VTI:            0.765 m AV Peak Grad:      49.8 mmHg AV Mean Grad:      21.0 mmHg LVOT Vmax:         99.80 cm/s LVOT Vmean:        73.900 cm/s LVOT VTI:          0.190 m LVOT/AV VTI ratio: 0.25  AORTA Ao Root diam: 3.10 cm Ao Asc diam:  3.10 cm MITRAL VALVE                TRICUSPID VALVE MV Area (PHT): 5.02 cm     TR  Peak grad:   43.0 mmHg MV Area VTI:   1.78 cm     TR Vmax:        328.00 cm/s MV Peak grad:  8.4 mmHg MV Mean grad:  3.0 mmHg     SHUNTS MV Vmax:       1.45 m/s     Systemic VTI:  0.19 m MV Vmean:      87.3 cm/s    Systemic Diam: 1.90 cm MV Decel Time: 151 msec MV E velocity: 130.00 cm/s MV A velocity: 94.90 cm/s MV E/A ratio:  1.37 Candee Furbish MD Electronically signed by Candee Furbish MD Signature Date/Time: 09/29/2021/5:46:56 PM    Final     Cardiac Studies:  ECG: afib PVC nonspecific ST changes, repeat ECG pending   Telemetry: Sinus rhythm versus slow atrial flutter  Echo: 10/17  1. Left ventricular ejection fraction, by estimation, is 45 to 50%. The  left ventricle has mildly decreased function. The left ventricle  demonstrates global hypokinesis. There is mild left ventricular  hypertrophy. Left ventricular diastolic parameters  are consistent with Grade III diastolic dysfunction (restrictive). There  is the interventricular septum is flattened in systole and diastole,  consistent with right ventricular pressure and volume overload.  2. Right ventricular systolic function is normal. The right ventricular  size is normal. There is moderately elevated pulmonary artery systolic  pressure. The estimated right ventricular systolic pressure is 56.4 mmHg.   3. The mitral valve is normal in structure. Mild mitral valve  regurgitation. No evidence of mitral stenosis.   4. Tricuspid valve regurgitation is severe.   5. The aortic valve is calcified. There is severe calcifcation of the  aortic valve. There is severe thickening of the aortic valve. Aortic valve  regurgitation is trivial. Moderate aortic valve stenosis. Aortic valve  mean gradient measures 21.0 mmHg.  Aortic valve Vmax measures 3.53 m/s.   6. The inferior vena cava is normal in size with greater than 50%  respiratory variability, suggesting right atrial pressure of 3 mmHg.  Comparison(s): Prior mean aortic valve gradient 17.5 mmHg.   TEE 05/06/21 EF 55-60% moderate AS mean gradient 24 mmHg   Medications:    allopurinol  100 mg Oral Daily   amiodarone  200 mg Oral BID   apixaban  2.5 mg Oral BID   arformoterol  15 mcg Nebulization BID   budesonide (PULMICORT) nebulizer solution  0.5 mg Nebulization BID   Chlorhexidine Gluconate Cloth  6 each Topical Daily   furosemide  60 mg Intravenous Daily   insulin aspart  0-15 Units Subcutaneous TID WC   insulin aspart  0-5 Units Subcutaneous QHS   levothyroxine  88 mcg Oral Q0600   metroNIDAZOLE  500 mg Oral Q12H   rosuvastatin  20 mg Oral Daily   sodium chloride flush  10 mL Intravenous Q12H   sodium chloride flush  10-40 mL Intracatheter Q12H   sotorasib  960 mg Oral Daily   tamsulosin  0.4 mg Oral QHS   umeclidinium bromide  1 puff Inhalation Daily      ceFEPime (MAXIPIME) IV 2 g (09/30/21 2125)    Assessment/Plan:  PAF: He has been compliant with renal dose Eliquis, no doses missed.  Per Dr Curt Bears on amiodarone 200 bid loading will cancel [facility] as not good idea to give Propofol while on high flow oxygen Rate control is fine  CAD: s/p CABG 2016 w/ subsequent PTCA PLA/PDA.  No ischemic symptoms, no ASA secondary to Eliquis, continue statin AS: At recent TEE, AS gradient was 24, this echo measured it at 21, suspect underestimated Lung Cancer: has had chemo, now on oral Lumakras (sotorasib),   CXR suggests pneumonia,continue Maxipime and metronidazole  CRF: Volume overloaded by exam.  800 cc out weight down now azotemic will decrease lasix and change to PO 20 mg daily   Prognosis seems somewhat guarded   Jenkins Rouge, MD 10/01/2021 9:55 AM

## 2021-10-01 NOTE — Progress Notes (Signed)
[facility]                                                                              Patient Demographics  Randall Wilkins, is a 80 y.o. male, DOB - Mar 26, 1941, VWU:[id number]  Admit date - 09/26/2021   Admitting Physician Kristopher Oppenheim, DO  Outpatient Primary MD for the patient is [name], MD  Outpatient specialists:   LOS - 4  days   Medical records reviewed and are as summarized below:    No chief complaint on file.      Brief summary   Patient is a 80 year old male with history of combined CHF, stage IV lung CA, chronic hypoxic respiratory failure on 2 to 3 L O2 at home, paroxysmal atrial fibrillation, diabetes mellitus, CAD, obesity, hearing deficit presented with progressive weakness and acute shortness of breath.  He was admitted with acute on chronic hypoxic respiratory failure due to CHF exacerbation.  He was hypoxic to 52% on room air requiring up to 6 L.  BNP elevated to 1200.  Chest x-ray showed pulmonary edema.  Troponin 76> 83.  EKG showed rate controlled A. fib with occasional PVCs.  Patient was started on IV Lasix. Patient was noted to have respiratory distress, increasing oxygen requirement, worsening chest x-ray, placed on IV antibiotics, broadened to IV cefepime and Flagyl. On 10/18, required BiPAP and pulmonology was consulted. Cardiology and palliative medicine also following  Assessment & Plan    Principal Problem: Acute on chronic hypoxic respiratory failure on 2 to 3 L O2 at baseline Acute on chronic combined systolic and diastolic CHF -On 29/56, had respiratory distress requiring BiPAP, currently off.  On 15L O2 HFNC -Has history of OSA, previously was using CPAP at home (not lately) -continue aggressive diuresis -Pulmonology was consulted, likely multifactorial due to CHF, third spacing, lymphangitic spread versus pneumonia, also recommended palliative approach, diuresis, antibiotics. -Overall prognosis guarded -Continue  Lasix   Active Problems: Paroxysmal atrial fibrillation -DCCV canceled due to respiratory status -Continue amiodarone, Eliquis    CAD S/P percutaneous coronary angioplasty, aortic stenosis -Currently no chest pain, status post CABG 2016.  - No ischemic symptoms, no aspirin secondary to eliquis, continue statin  History of lung CA, adenocarcinoma right lung stage IV -Continue oral Lumakras, outpatient follow-up with oncology, Dr. Julien Nordmann    Type 1 diabetes mellitus with nephropathy ([place]), IDDM with neuropathy, hyperglycemia, CKD -Continue sliding scale insulin  AKI on chronic kidney disease (CKD), stage III (moderate) (HCC) - currently stable, within baseline Baseline creatinine 1.7-1.9     Essential hypertension -BP stable,  BPH -Continue Flomax  Anemia of chronic disease normocytic -H&H currently stable,  Obesity Estimated body mass index is 32.8 kg/m as calculated from the following:   Height as of this encounter: 5\' 7"  (1.702 m).   Weight as of this encounter: 95 kg.  Code Status: DNR DVT Prophylaxis:  apixaban (ELIQUIS) tablet 2.5 mg Start: 09/30/21 2200 SCDs Start: 09/27/21 0317 apixaban (ELIQUIS) tablet 2.5 mg   Level of Care: Level of care: Telemetry Medical Family Communication: Discussed all imaging results, lab results, explained to the patient and wife at the bedside  Disposition Plan:     Status is: Inpatient  Remains inpatient appropriate because: Still hypoxic, on 15 L O2 HFNC, baseline at home 2 to 3 L.  Time Spent in minutes   35 minutes  Procedures:    Consultants:   Cardiology Pulmonology Palliative medicine  Antimicrobials:   Anti-infectives (From admission, onward)    Start     Dose/Rate Route Frequency Ordered Stop   09/30/21 1000  metroNIDAZOLE (FLAGYL) tablet 500 mg        500 mg Oral Every 12 hours 09/30/21 0838     09/28/21 1800  metroNIDAZOLE (FLAGYL) IVPB 500 mg  Status:  Discontinued        500 mg 100 mL/hr over 60  Minutes Intravenous Every 12 hours 09/28/21 1520 09/30/21 0838   09/28/21 1630  ceFEPIme (MAXIPIME) 2 g in sodium chloride 0.9 % 100 mL IVPB        2 g 200 mL/hr over 30 Minutes Intravenous Every 12 hours 09/28/21 1537     09/27/21 0930  cefTRIAXone (ROCEPHIN) 1 g in sodium chloride 0.9 % 100 mL IVPB  Status:  Discontinued        1 g 200 mL/hr over 30 Minutes Intravenous Every 24 hours 09/27/21 0839 09/28/21 1520          Medications  Scheduled Meds:  allopurinol  100 mg Oral Daily   amiodarone  200 mg Oral BID   apixaban  2.5 mg Oral BID   arformoterol  15 mcg Nebulization BID   budesonide (PULMICORT) nebulizer solution  0.5 mg Nebulization BID   Chlorhexidine Gluconate Cloth  6 each Topical Daily   [START ON 10/02/2021] furosemide  20 mg Oral Daily   insulin aspart  0-15 Units Subcutaneous TID WC   insulin aspart  0-5 Units Subcutaneous QHS   levothyroxine  88 mcg Oral Q0600   metroNIDAZOLE  500 mg Oral Q12H   rosuvastatin  20 mg Oral Daily   sodium chloride flush  10 mL Intravenous Q12H   sodium chloride flush  10-40 mL Intracatheter Q12H   sotorasib  960 mg Oral Daily   tamsulosin  0.4 mg Oral QHS   umeclidinium bromide  1 puff Inhalation Daily   Continuous Infusions:  ceFEPime (MAXIPIME) IV 2 g (10/01/21 0958)   PRN Meds:.acetaminophen **OR** acetaminophen, ipratropium-albuterol, sodium chloride flush, sodium chloride flush      Subjective:   Randall Wilkins was seen and examined today.  On 15 L O2 via HFNC.  BiPAP off this morning.  Wife at the bedside.  No worsening chest pain or shortness of breath.  Afebrile.  No acute events overnight.    Objective:   Vitals:   10/01/21 0732 10/01/21 0732 10/01/21 0758 10/01/21 1149  BP:  (!) 128/59  (!) 138/49  Pulse:  61 (!) 58 71  Resp:   (!) 22 (!) 21  Temp:    98.4 F (36.9 C)  TempSrc:    Oral  SpO2: 100% 100% 96% 100%  Weight:      Height:        Intake/Output Summary (Last 24 hours) at 10/01/2021  1302 Last data filed at 10/01/2021 1115 Gross per 24 hour  Intake 715.78 ml  Output 1600 ml  Net -884.22 ml     Wt Readings from Last 3 Encounters:  10/01/21 95 kg  09/17/21 100.7 kg  09/09/21 101.2 kg     Exam General: Alert and oriented x 3, NAD Cardiovascular: JVD+, 3/6 murmur  Respiratory:  Bibasilar crackles Gastrointestinal: Soft, nontender, nondistended, + bowel sounds Ext: 1+ pedal edema bilaterally Neuro: no new FND's Psych: Normal affect and demeanor, alert and oriented x3    Data Reviewed:  I have personally reviewed following labs and imaging studies  Micro Results Recent Results (from the past 240 hour(s))  Blood Culture (routine x 2)     Status: None (Preliminary result)   Collection Time: 09/27/21 12:20 AM   Specimen: BLOOD  Result Value Ref Range Status   Specimen Description BLOOD PORTA CATH  Final   Special Requests   Final    BOTTLES DRAWN AEROBIC AND ANAEROBIC Blood Culture adequate volume   Culture   Final    NO GROWTH 4 DAYS Performed at [facility], [address], [place], [place] [address]    Report Status PENDING  Incomplete  Resp Panel by RT-PCR (Flu A&B, Covid) Peripheral     Status: None   Collection Time: 09/27/21 12:25 AM   Specimen: Peripheral; Nasopharyngeal(NP) swabs in vial transport medium  Result Value Ref Range Status   SARS Coronavirus 2 by RT PCR NEGATIVE NEGATIVE Final    Comment: (NOTE) SARS-CoV-2 target nucleic acids are NOT DETECTED.  The SARS-CoV-2 RNA is generally detectable in upper respiratory specimens during the acute phase of infection. The lowest concentration of SARS-CoV-2 viral copies this assay can detect is 138 copies/mL. A negative result does not preclude SARS-Cov-2 infection and should not be used as the sole basis for treatment or other patient management decisions. A negative result may occur with  improper specimen collection/handling, submission of specimen other than nasopharyngeal swab,  presence of viral mutation(s) within the areas targeted by this assay, and inadequate number of viral copies(<138 copies/mL). A negative result must be combined with clinical observations, patient history, and epidemiological information. The expected result is Negative.  Fact Sheet for Patients:  EntrepreneurPulse.com.au  Fact Sheet for Healthcare Providers:  IncredibleEmployment.be  This test is no t yet approved or cleared by the [place] FDA and  has been authorized for detection and/or diagnosis of SARS-CoV-2 by FDA under an Emergency Use Authorization (EUA). This EUA will remain  in effect (meaning this test can be used) for the duration of the COVID-19 declaration under Section 564(b)(1) of the Act, 21 U.S.C.section 360bbb-3(b)(1), unless the authorization is terminated  or revoked sooner.       Influenza A by PCR NEGATIVE NEGATIVE Final   Influenza B by PCR NEGATIVE NEGATIVE Final    Comment: (NOTE) The Xpert Xpress SARS-CoV-2/FLU/RSV plus assay is intended as an aid in the diagnosis of influenza from Nasopharyngeal swab specimens and should not be used as a sole basis for treatment. Nasal washings and aspirates are unacceptable for Xpert Xpress SARS-CoV-2/FLU/RSV testing.  Fact Sheet for Patients: EntrepreneurPulse.com.au  Fact Sheet for Healthcare Providers: IncredibleEmployment.be  This test is not yet approved or cleared by the [place] FDA and has been authorized for detection and/or diagnosis of SARS-CoV-2 by FDA under an Emergency Use Authorization (EUA). This EUA will remain in effect (meaning this test can be used) for the duration of the COVID-19 declaration under Section 564(b)(1) of the Act, 21 U.S.C. section 360bbb-3(b)(1), unless the authorization is terminated or revoked.  Performed at [facility], [place] [address] [place], [place] [address]   Blood Culture  (routine x 2)     Status: None (Preliminary result)   Collection Time: 09/27/21 12:38 AM   Specimen: BLOOD LEFT FOREARM  Result Value Ref  Range Status   Specimen Description BLOOD LEFT FOREARM  Final   Special Requests   Final    BOTTLES DRAWN AEROBIC AND ANAEROBIC Blood Culture adequate volume   Culture   Final    NO GROWTH 4 DAYS Performed at [facility], [address] [place], [place] [address]    Report Status PENDING  Incomplete    Radiology Reports CT Abdomen Pelvis Wo Contrast  Result Date: 09/10/2021 CLINICAL DATA:  Primary Cancer Type: Lung Imaging Indication: Assess response to therapy Interval therapy since last imaging? Yes Initial Cancer Diagnosis Date: 08/06/2020; Established by: Biopsy-proven Detailed Pathology: Stage IV non-small cell lung cancer, mucinous adenocarcinoma. Primary Tumor location: Left upper lobe, right lower lobe. Surgeries: CABG 2016.  Cholecystectomy.  Multiple back surgeries. Chemotherapy: Yes; Ongoing?  No; Most recent administration: 01/2021 Immunotherapy?  Yes; Type: Keytruda; Ongoing? No Radiation therapy? No Other Cancer Therapies: Lumakras daily. EXAM: CT CHEST, ABDOMEN AND PELVIS WITHOUT CONTRAST TECHNIQUE: Multidetector CT imaging of the chest, abdomen and pelvis was performed following the standard protocol without IV contrast. COMPARISON:  Most recent CT chest, abdomen and pelvis 06/17/2021. 08/22/2020 PET-CT. FINDINGS: CT CHEST FINDINGS Cardiovascular: Right IJ Port-A-Cath terminates in the high right atrium. Atherosclerotic calcification of the aorta and aortic valve. Heart is enlarged. No pericardial effusion. Mediastinum/Nodes: 1.4 cm low-attenuation right thyroid nodule. No follow-up recommended. (Ref: J Am Coll Radiol. 2015 Feb;12(2): 143-50).No pathologically enlarged mediastinal or axillary lymph nodes. Hilar regions are difficult to evaluate without IV contrast. Esophagus is grossly unremarkable. Lungs/Pleura: Post treatment parenchymal  retraction, bronchiectasis and volume loss in the posteromedial mid and lower right hemithorax, as before. Small associated partially loculated right pleural effusion. Slight improvement in left upper lobe consolidation with associated architectural distortion. New peribronchovascular ground-glass in the left lower lobe (4/91), likely infectious or inflammatory in etiology. Subpleural reticular densities and ground-glass in the peripheral left lower lobe, similar. Trace left pleural fluid. Airway is unremarkable. Musculoskeletal: Degenerative changes in the spine. No worrisome lytic or sclerotic lesions. CT ABDOMEN PELVIS FINDINGS Hepatobiliary: Liver is unremarkable. Cholecystectomy. No biliary ductal dilatation. Pancreas: Negative. Spleen: Negative. Adrenals/Urinary Tract: Adrenal glands are unremarkable. 4.6 cm fluid density lesion off the lower pole right kidney is likely a cyst. Other smaller low-attenuation lesions in the kidneys are too small to characterize. There is a subtle 1.8 cm lesion the posterior left kidney (2/73), as on prior exams. No urinary stones. Ureters are decompressed. Bladder is grossly unremarkable. Stomach/Bowel: Stomach is unremarkable. Duodenal diverticula incidentally noted. Small bowel and colon unremarkable. Appendix is questionably visualized. Vascular/Lymphatic: Atherosclerotic calcification of the aorta. No pathologically enlarged lymph nodes. Reproductive: Prostate is visualized. Other: Slight haziness in the anterior left anatomic pelvis (2/100), nonspecific and without a definite source. Mesenteries and peritoneum are otherwise unremarkable. Musculoskeletal: 6.6 x 9.9 cm partially calcified soft tissue mass posterior to the left sacroiliac joint and left sacrum, unchanged and possibly due to prior trauma. Degenerative changes in the spine. No worrisome lytic or sclerotic lesions. IMPRESSION: 1. Post treatment changes in the hemithoraces bilaterally. No evidence of recurrent or  metastatic disease. 2. Subtle 1.8 cm lesion off the posterior interpolar left kidney, as on prior exams. Renal cell carcinoma can not be excluded. 3. Small right pleural effusion. 4.  Aortic atherosclerosis (ICD10-I70.0). Electronically Signed   By: Lorin Picket M.D.   On: 09/10/2021 10:50   CT Chest Wo Contrast  Result Date: 09/10/2021 CLINICAL DATA:  Primary Cancer Type: Lung Imaging Indication: Assess response to therapy Interval  therapy since last imaging? Yes Initial Cancer Diagnosis Date: 08/06/2020; Established by: Biopsy-proven Detailed Pathology: Stage IV non-small cell lung cancer, mucinous adenocarcinoma. Primary Tumor location: Left upper lobe, right lower lobe. Surgeries: CABG 2016.  Cholecystectomy.  Multiple back surgeries. Chemotherapy: Yes; Ongoing?  No; Most recent administration: 01/2021 Immunotherapy?  Yes; Type: Keytruda; Ongoing? No Radiation therapy? No Other Cancer Therapies: Lumakras daily. EXAM: CT CHEST, ABDOMEN AND PELVIS WITHOUT CONTRAST TECHNIQUE: Multidetector CT imaging of the chest, abdomen and pelvis was performed following the standard protocol without IV contrast. COMPARISON:  Most recent CT chest, abdomen and pelvis 06/17/2021. 08/22/2020 PET-CT. FINDINGS: CT CHEST FINDINGS Cardiovascular: Right IJ Port-A-Cath terminates in the high right atrium. Atherosclerotic calcification of the aorta and aortic valve. Heart is enlarged. No pericardial effusion. Mediastinum/Nodes: 1.4 cm low-attenuation right thyroid nodule. No follow-up recommended. (Ref: J Am Coll Radiol. 2015 Feb;12(2): 143-50).No pathologically enlarged mediastinal or axillary lymph nodes. Hilar regions are difficult to evaluate without IV contrast. Esophagus is grossly unremarkable. Lungs/Pleura: Post treatment parenchymal retraction, bronchiectasis and volume loss in the posteromedial mid and lower right hemithorax, as before. Small associated partially loculated right pleural effusion. Slight improvement in left  upper lobe consolidation with associated architectural distortion. New peribronchovascular ground-glass in the left lower lobe (4/91), likely infectious or inflammatory in etiology. Subpleural reticular densities and ground-glass in the peripheral left lower lobe, similar. Trace left pleural fluid. Airway is unremarkable. Musculoskeletal: Degenerative changes in the spine. No worrisome lytic or sclerotic lesions. CT ABDOMEN PELVIS FINDINGS Hepatobiliary: Liver is unremarkable. Cholecystectomy. No biliary ductal dilatation. Pancreas: Negative. Spleen: Negative. Adrenals/Urinary Tract: Adrenal glands are unremarkable. 4.6 cm fluid density lesion off the lower pole right kidney is likely a cyst. Other smaller low-attenuation lesions in the kidneys are too small to characterize. There is a subtle 1.8 cm lesion the posterior left kidney (2/73), as on prior exams. No urinary stones. Ureters are decompressed. Bladder is grossly unremarkable. Stomach/Bowel: Stomach is unremarkable. Duodenal diverticula incidentally noted. Small bowel and colon unremarkable. Appendix is questionably visualized. Vascular/Lymphatic: Atherosclerotic calcification of the aorta. No pathologically enlarged lymph nodes. Reproductive: Prostate is visualized. Other: Slight haziness in the anterior left anatomic pelvis (2/100), nonspecific and without a definite source. Mesenteries and peritoneum are otherwise unremarkable. Musculoskeletal: 6.6 x 9.9 cm partially calcified soft tissue mass posterior to the left sacroiliac joint and left sacrum, unchanged and possibly due to prior trauma. Degenerative changes in the spine. No worrisome lytic or sclerotic lesions. IMPRESSION: 1. Post treatment changes in the hemithoraces bilaterally. No evidence of recurrent or metastatic disease. 2. Subtle 1.8 cm lesion off the posterior interpolar left kidney, as on prior exams. Renal cell carcinoma can not be excluded. 3. Small right pleural effusion. 4.  Aortic  atherosclerosis (ICD10-I70.0). Electronically Signed   By: Lorin Picket M.D.   On: 09/10/2021 10:50   DG Chest Port 1 View  Result Date: 09/30/2021 CLINICAL DATA:  Dyspnea EXAM: PORTABLE CHEST 1 VIEW COMPARISON:  09/28/2021, 09/26/2021, CT 09/09/2001 FINDINGS: Right-sided central venous port tip over the cavoatrial region. Cardiomegaly with vascular congestion. Suspected bilateral pleural effusions. Similar appearance of bilateral ground-glass opacities and patchy consolidations. No visible pneumothorax. IMPRESSION: Cardiomegaly with vascular congestion and suspected pleural effusions. Similar appearing bilateral ground-glass disease and patchy areas of consolidation Electronically Signed   By: Donavan Foil M.D.   On: 09/30/2021 16:32   DG CHEST PORT 1 VIEW  Result Date: 09/28/2021 CLINICAL DATA:  Dyspnea.  History of CHF EXAM: PORTABLE CHEST 1 VIEW COMPARISON:  09/28/2021  FINDINGS: Right-sided chest port remains in place. Cardiomegaly. Prior median sternotomy. Bilateral patchy airspace opacities persist, overall similar to the previous radiograph. Small right pleural effusion. No pneumothorax. IMPRESSION: Bilateral patchy airspace opacities persist, overall similar to previous radiograph. Electronically Signed   By: Davina Poke D.O.   On: 09/28/2021 15:28   DG CHEST PORT 1 VIEW  Result Date: 09/28/2021 CLINICAL DATA:  Shortness of breath EXAM: PORTABLE CHEST 1 VIEW COMPARISON:  Chest x-ray dated 09/26/2021 FINDINGS: Increased bilateral perihilar and bibasilar opacities, some components suggesting dense consolidation and some components of ground-glass opacity. Probable small RIGHT pleural effusion. No pneumothorax is seen. Stable cardiomegaly. RIGHT chest wall Port-A-Cath is stable in position. Median sternotomy wires in place. IMPRESSION: 1. Increased bilateral perihilar and bibasilar opacities, including some components of ground-glass opacity, compatible with multifocal pneumonia versus  pulmonary edema. 2. Probable small RIGHT pleural effusion. Electronically Signed   By: Franki Cabot M.D.   On: 09/28/2021 07:33   DG Chest Port 1 View  Result Date: 09/26/2021 CLINICAL DATA:  Respiratory distress. EXAM: PORTABLE CHEST 1 VIEW COMPARISON:  May 01, 2021 FINDINGS: There is stable right-sided venous Port-A-Cath positioning. Multiple sternal wires are seen. Mild, diffuse, chronic appearing increased interstitial lung markings are seen. Mild, stable areas of atelectasis are seen within the bilateral lung bases and mid left lung. Stable elevation of the right hemidiaphragm is seen. There is no evidence of a pleural effusion or pneumothorax. The cardiac silhouette is enlarged and unchanged in size. A radiopaque fusion plate and screws are seen overlying the cervical spine. IMPRESSION: Chronic appearing increased interstitial lung markings with mild, stable bibasilar and mid left lung atelectasis. Electronically Signed   By: Virgina Norfolk M.D.   On: 09/26/2021 23:11   ECHOCARDIOGRAM COMPLETE  Result Date: 09/29/2021    ECHOCARDIOGRAM REPORT   Patient Name:   Randall Wilkins Date of Exam: 09/29/2021 Medical Rec #:  [id number]      Height:       67.0 in Accession #:    [id number]     Weight:       214.9 lb Date of Birth:  [birth year]      BSA:          2.085 m Patient Age:    13 years       BP:           120/58 mmHg Patient Gender: M              HR:           69 bpm. Exam Location:  Inpatient Procedure: 2D Echo, Color Doppler and Cardiac Doppler Indications:    P71.06 Acute systolic (congestive) heart failure  History:        Patient has prior history of Echocardiogram examinations, most                 recent 05/06/2021. CHF, Prior CABG; Risk Factors:Hypertension,                 Diabetes and Dyslipidemia.  Sonographer:    Raquel Sarna Senior RDCS Referring Phys: Caroline  1. Left ventricular ejection fraction, by estimation, is 45 to 50%. The left ventricle has mildly decreased  function. The left ventricle demonstrates global hypokinesis. There is mild left ventricular hypertrophy. Left ventricular diastolic parameters are consistent with Grade III diastolic dysfunction (restrictive). There is the interventricular septum is flattened in systole and diastole, consistent with right ventricular pressure and volume overload.  2.  Right ventricular systolic function is normal. The right ventricular size is normal. There is moderately elevated pulmonary artery systolic pressure. The estimated right ventricular systolic pressure is 35.0 mmHg.  3. The mitral valve is normal in structure. Mild mitral valve regurgitation. No evidence of mitral stenosis.  4. Tricuspid valve regurgitation is severe.  5. The aortic valve is calcified. There is severe calcifcation of the aortic valve. There is severe thickening of the aortic valve. Aortic valve regurgitation is trivial. Moderate aortic valve stenosis. Aortic valve mean gradient measures 21.0 mmHg. Aortic valve Vmax measures 3.53 m/s.  6. The inferior vena cava is normal in size with greater than 50% respiratory variability, suggesting right atrial pressure of 3 mmHg. Comparison(s): Prior mean aortic valve gradient 17.5 mmHg. FINDINGS  Left Ventricle: Left ventricular ejection fraction, by estimation, is 45 to 50%. The left ventricle has mildly decreased function. The left ventricle demonstrates global hypokinesis. The left ventricular internal cavity size was normal in size. There is  mild left ventricular hypertrophy. The interventricular septum is flattened in systole and diastole, consistent with right ventricular pressure and volume overload. Left ventricular diastolic parameters are consistent with Grade III diastolic dysfunction (restrictive). Right Ventricle: The right ventricular size is normal. No increase in right ventricular wall thickness. Right ventricular systolic function is normal. There is moderately elevated pulmonary artery systolic  pressure. The tricuspid regurgitant velocity is 3.28 m/s, and with an assumed right atrial pressure of 8 mmHg, the estimated right ventricular systolic pressure is 09.3 mmHg. Left Atrium: Left atrial size was normal in size. Right Atrium: Right atrial size was normal in size. Pericardium: There is no evidence of pericardial effusion. Mitral Valve: The mitral valve is normal in structure. Mild mitral valve regurgitation. No evidence of mitral valve stenosis. MV peak gradient, 8.4 mmHg. The mean mitral valve gradient is 3.0 mmHg. Tricuspid Valve: The tricuspid valve is normal in structure. Tricuspid valve regurgitation is severe. No evidence of tricuspid stenosis. Aortic Valve: The aortic valve is calcified. There is severe calcifcation of the aortic valve. There is severe thickening of the aortic valve. Aortic valve regurgitation is trivial. Moderate aortic stenosis is present. Aortic valve mean gradient measures  21.0 mmHg. Aortic valve peak gradient measures 49.8 mmHg. Aortic valve area, by VTI measures 0.70 cm. Pulmonic Valve: The pulmonic valve was normal in structure. Pulmonic valve regurgitation is not visualized. No evidence of pulmonic stenosis. Aorta: The aortic root is normal in size and structure. Venous: The inferior vena cava is normal in size with greater than 50% respiratory variability, suggesting right atrial pressure of 3 mmHg. IAS/Shunts: No atrial level shunt detected by color flow Doppler.  LEFT VENTRICLE PLAX 2D LVIDd:         4.60 cm   Diastology LVIDs:         3.70 cm   LV e' medial:    9.11 cm/s LV PW:         1.30 cm   LV E/e' medial:  14.3 LV IVS:        1.20 cm   LV e' lateral:   11.30 cm/s LVOT diam:     1.90 cm   LV E/e' lateral: 11.5 LV SV:         54 LV SV Index:   26 LVOT Area:     2.84 cm  RIGHT VENTRICLE RV S prime:     10.40 cm/s TAPSE (M-mode): 1.6 cm LEFT ATRIUM  Index        RIGHT ATRIUM           Index LA diam:        3.80 cm 1.82 cm/m   RA Area:     22.40 cm LA  Vol (A2C):   62.4 ml 29.93 ml/m  RA Volume:   68.40 ml  32.80 ml/m LA Vol (A4C):   49.3 ml 23.64 ml/m LA Biplane Vol: 55.5 ml 26.62 ml/m  AORTIC VALVE AV Area (Vmax):    0.80 cm AV Area (Vmean):   1.06 cm AV Area (VTI):     0.70 cm AV Vmax:           353.00 cm/s AV Vmean:          198.000 cm/s AV VTI:            0.765 m AV Peak Grad:      49.8 mmHg AV Mean Grad:      21.0 mmHg LVOT Vmax:         99.80 cm/s LVOT Vmean:        73.900 cm/s LVOT VTI:          0.190 m LVOT/AV VTI ratio: 0.25  AORTA Ao Root diam: 3.10 cm Ao Asc diam:  3.10 cm MITRAL VALVE                TRICUSPID VALVE MV Area (PHT): 5.02 cm     TR Peak grad:   43.0 mmHg MV Area VTI:   1.78 cm     TR Vmax:        328.00 cm/s MV Peak grad:  8.4 mmHg MV Mean grad:  3.0 mmHg     SHUNTS MV Vmax:       1.45 m/s     Systemic VTI:  0.19 m MV Vmean:      87.3 cm/s    Systemic Diam: 1.90 cm MV Decel Time: 151 msec MV E velocity: 130.00 cm/s MV A velocity: 94.90 cm/s MV E/A ratio:  1.37 Candee Furbish MD Electronically signed by Candee Furbish MD Signature Date/Time: 09/29/2021/5:46:56 PM    Final     Lab Data:  CBC: Recent Labs  Lab 09/27/21 0020 09/27/21 0407 09/28/21 0449 09/29/21 0622 09/30/21 0515 10/01/21 0529  WBC 8.1 6.9 8.2 9.8 7.2  --   NEUTROABS 6.3 4.7  --   --   --   --   HGB 8.7* 8.8* 9.8* 9.7* 8.9* 8.8*  HCT 29.4* 28.8* 32.4* 31.6* 29.9* 29.6*  MCV 96.4 95.0 94.5 93.5 95.2  --   PLT 121* 96* 102* 110* 110*  --    Basic Metabolic Panel: Recent Labs  Lab 09/27/21 0407 09/28/21 0449 09/28/21 0930 09/29/21 0703 09/30/21 0515 10/01/21 0529  NA 137 138 139 136 139 133*  K 4.3 3.7 3.6 3.7 3.7 4.1  CL 105 103 102 102 104 101  CO2 24 25 26 24 26 23   GLUCOSE 169* 105* 199* 141* 142* 223*  BUN 38* 35* 35* 36* 41* 48*  CREATININE 2.27* 1.99* 1.86* 1.76* 1.78* 1.93*  CALCIUM 8.4* 8.5* 8.5* 8.6* 8.6* 8.2*  MG 2.2 2.0  --  2.3 2.0 2.2  PHOS  --  4.3  --  4.2 3.7 4.5   GFR: Estimated Creatinine Clearance: 33.5 mL/min (A)  (by C-G formula based on SCr of 1.93 mg/dL (H)). Liver Function Tests: Recent Labs  Lab 09/27/21 0020 09/27/21 0407 09/28/21 5170 09/29/21 0703 09/30/21 0515 10/01/21 0174  AST 17 15  --   --   --   --   ALT 11 8  --   --   --   --   ALKPHOS 92 89  --   --   --   --   BILITOT 0.9 0.7  --   --   --   --   PROT 5.8* 5.6*  --   --   --   --   ALBUMIN 2.8* 2.6* 2.7* 2.6* 2.2* 2.2*   No results for input(s): LIPASE, AMYLASE in the last 168 hours. No results for input(s): AMMONIA in the last 168 hours. Coagulation Profile: Recent Labs  Lab 09/27/21 0020  INR 1.8*   Cardiac Enzymes: Recent Labs  Lab 09/28/21 0449  CKTOTAL 38*   BNP (last 3 results) No results for input(s): PROBNP in the last 8760 hours. HbA1C: No results for input(s): HGBA1C in the last 72 hours. CBG: Recent Labs  Lab 09/30/21 1208 09/30/21 1624 09/30/21 2106 10/01/21 0611 10/01/21 1114  GLUCAP 223* 144* 171* 224* 211*   Lipid Profile: No results for input(s): CHOL, HDL, LDLCALC, TRIG, CHOLHDL, LDLDIRECT in the last 72 hours. Thyroid Function Tests: No results for input(s): TSH, T4TOTAL, FREET4, T3FREE, THYROIDAB in the last 72 hours. Anemia Panel: No results for input(s): VITAMINB12, FOLATE, FERRITIN, TIBC, IRON, RETICCTPCT in the last 72 hours. Urine analysis:    Component Value Date/Time   COLORURINE YELLOW 09/27/2021 0021   APPEARANCEUR CLEAR 09/27/2021 0021   LABSPEC 1.011 09/27/2021 0021   PHURINE 5.0 09/27/2021 0021   GLUCOSEU NEGATIVE 09/27/2021 0021   GLUCOSEU NEGATIVE 08/29/2019 0959   HGBUR NEGATIVE 09/27/2021 0021   BILIRUBINUR NEGATIVE 09/27/2021 0021   KETONESUR NEGATIVE 09/27/2021 0021   PROTEINUR 30 (A) 09/27/2021 0021   UROBILINOGEN 0.2 08/29/2019 0959   NITRITE NEGATIVE 09/27/2021 0021   LEUKOCYTESUR NEGATIVE 09/27/2021 0021     Shaan Rhoads M.D. [facility] 10/01/2021, 1:02 PM  Available via Epic secure chat 7am-7pm After 7 pm, please refer to night coverage  provider listed on amion.

## 2021-10-02 DIAGNOSIS — J9601 Acute respiratory failure with hypoxia: Secondary | ICD-10-CM | POA: Diagnosis not present

## 2021-10-02 DIAGNOSIS — I5043 Acute on chronic combined systolic (congestive) and diastolic (congestive) heart failure: Secondary | ICD-10-CM | POA: Diagnosis not present

## 2021-10-02 DIAGNOSIS — I4892 Unspecified atrial flutter: Secondary | ICD-10-CM | POA: Diagnosis not present

## 2021-10-02 DIAGNOSIS — C3491 Malignant neoplasm of unspecified part of right bronchus or lung: Secondary | ICD-10-CM | POA: Diagnosis not present

## 2021-10-02 DIAGNOSIS — I4891 Unspecified atrial fibrillation: Secondary | ICD-10-CM | POA: Diagnosis not present

## 2021-10-02 LAB — GLUCOSE, CAPILLARY
Glucose-Capillary: 178 mg/dL — ABNORMAL HIGH (ref 70–99)
Glucose-Capillary: 241 mg/dL — ABNORMAL HIGH (ref 70–99)
Glucose-Capillary: 204 mg/dL — ABNORMAL HIGH (ref 70–99)
Glucose-Capillary: 181 mg/dL — ABNORMAL HIGH (ref 70–99)
Glucose-Capillary: 197 mg/dL — ABNORMAL HIGH (ref 70–99)

## 2021-10-02 LAB — CULTURE, BLOOD (ROUTINE X 2)
Culture: NO GROWTH
Culture: NO GROWTH
Special Requests: ADEQUATE
Special Requests: ADEQUATE

## 2021-10-02 MED ORDER — FUROSEMIDE 20 MG PO TABS
20.0000 mg | ORAL_TABLET | Freq: Once | ORAL | Status: AC
  Administered 2021-10-02: 20 mg via ORAL
  Filled 2021-10-02: qty 1

## 2021-10-02 NOTE — Care Management Important Message (Signed)
Important Message  Patient Details  Name: Randall Wilkins MRN: [id number] Date of Birth: [birth year]   Medicare Important Message Given:  Yes     [name] 10/02/2021, 8:15 AM

## 2021-10-02 NOTE — Progress Notes (Signed)
   10/02/21 0927  Mobility  Activity Contraindicated/medical hold (Pt on heathed HF per RT, not appropriate for mobility at this time.)

## 2021-10-02 NOTE — Progress Notes (Signed)
[facility]                                                                              Patient Demographics  Randall Wilkins, is a 80 y.o. male, DOB - [birth year], ZHY:[id number]  Admit date - 09/26/2021   Admitting Physician Kristopher Oppenheim, DO  Outpatient Primary MD for the patient is Randall Arabian, MD  Outpatient specialists:   LOS - 5  days   Medical records reviewed and are as summarized below:    No chief complaint on file.      Brief summary   Patient is a 80 year old male with history of combined CHF, stage IV lung CA, chronic hypoxic respiratory failure on 2 to 3 L O2 at home, paroxysmal atrial fibrillation, diabetes mellitus, CAD, obesity, hearing deficit presented with progressive weakness and acute shortness of breath.  He was admitted with acute on chronic hypoxic respiratory failure due to CHF exacerbation.  He was hypoxic to 52% on room air requiring up to 6 L.  BNP elevated to 1200.  Chest x-ray showed pulmonary edema.  Troponin 76> 83.  EKG showed rate controlled A. fib with occasional PVCs.  Patient was started on IV Lasix. Patient was noted to have respiratory distress, increasing oxygen requirement, worsening chest x-ray, placed on IV antibiotics, broadened to IV cefepime and Flagyl. On 10/18, required BiPAP and pulmonology was consulted. Cardiology and palliative medicine also following  Assessment & Plan    Principal Problem: Acute on chronic hypoxic respiratory failure on 2 to 3 L O2 at baseline Acute on chronic combined systolic and diastolic CHF -On 29/52, had respiratory distress requiring BiPAP, currently off.  On 15L O2 HFNC -Has history of OSA, previously was using CPAP at home (not lately) -Pulmonology was consulted, likely multifactorial due to CHF, third spacing, lymphangitic spread versus pneumonia, also recommended palliative approach, diuresis, antibiotics. -Still very hypoxic, on 20 L HFNC, normally at home 2 to 3 L -Still very  volume overloaded, cardiology giving additional Lasix, BP borderline, renal function trending up, 1.9.  Will discuss with cardiology if inotropic support with aggressive IV Lasix possible. -Overall poor prognosis, will consult palliative medicine  Active Problems: Paroxysmal atrial fibrillation -DCCV canceled due to tenuous respiratory status  -Continue amiodarone, Eliquis    CAD S/P percutaneous coronary angioplasty, aortic stenosis -Currently no chest pain, status post CABG 2016.  - No ischemic symptoms, no aspirin secondary to eliquis, continue statin  History of lung CA, adenocarcinoma right lung stage IV -Continue oral Lumakras, outpatient follow-up with oncology, Dr. Julien Wilkins    Type 1 diabetes mellitus with nephropathy ([place]), IDDM with neuropathy, hyperglycemia, CKD -Continue sliding scale insulin  AKI on chronic kidney disease (CKD), stage III (moderate) (HCC) - Baseline creatinine 1.7-1.9 -Creatinine trending up, 1.9, still at baseline     Essential hypertension -BP stable  BPH -Continue Flomax  Anemia of chronic disease normocytic -H&H currently stable,  Obesity Estimated body mass index is 33.25 kg/m as calculated from the following:   Height as of this encounter: 5\' 7"  (1.702 m).   Weight as of this encounter: 96.3 kg.  Code Status: DNR DVT Prophylaxis:  apixaban (ELIQUIS) tablet 2.5 mg Start: 09/30/21 2200 SCDs Start: 09/27/21 0317 apixaban (ELIQUIS) tablet 2.5 mg   Level of Care: Level of care: Telemetry Medical Family Communication: Discussed all imaging results, lab results, explained to the patient and discussed with patient's wife at the bedside   Disposition Plan:     Status is: Inpatient  Remains inpatient appropriate because: Still hypoxic, on 15 L O2 HFNC, baseline at home 2 to 3 L.  Time Spent in minutes 35 minutes  Procedures:    Consultants:   Cardiology Pulmonology Palliative medicine  Antimicrobials:   Anti-infectives (From  admission, onward)    Start     Dose/Rate Route Frequency Ordered Stop   09/30/21 1000  metroNIDAZOLE (FLAGYL) tablet 500 mg        500 mg Oral Every 12 hours 09/30/21 0838 10/04/21 2359   09/28/21 1800  metroNIDAZOLE (FLAGYL) IVPB 500 mg  Status:  Discontinued        500 mg 100 mL/hr over 60 Minutes Intravenous Every 12 hours 09/28/21 1520 09/30/21 0838   09/28/21 1630  ceFEPIme (MAXIPIME) 2 g in sodium chloride 0.9 % 100 mL IVPB        2 g 200 mL/hr over 30 Minutes Intravenous Every 12 hours 09/28/21 1537 10/04/21 2359   09/27/21 0930  cefTRIAXone (ROCEPHIN) 1 g in sodium chloride 0.9 % 100 mL IVPB  Status:  Discontinued        1 g 200 mL/hr over 30 Minutes Intravenous Every 24 hours 09/27/21 0839 09/28/21 1520          Medications  Scheduled Meds:  allopurinol  100 mg Oral Daily   amiodarone  200 mg Oral BID   apixaban  2.5 mg Oral BID   arformoterol  15 mcg Nebulization BID   budesonide (PULMICORT) nebulizer solution  0.5 mg Nebulization BID   Chlorhexidine Gluconate Cloth  6 each Topical Daily   furosemide  20 mg Oral Daily   insulin aspart  0-15 Units Subcutaneous TID WC   insulin aspart  0-5 Units Subcutaneous QHS   levothyroxine  88 mcg Oral Q0600   metroNIDAZOLE  500 mg Oral Q12H   rosuvastatin  20 mg Oral Daily   sodium chloride flush  10 mL Intravenous Q12H   sodium chloride flush  10-40 mL Intracatheter Q12H   sotorasib  960 mg Oral Daily   tamsulosin  0.4 mg Oral QHS   umeclidinium bromide  1 puff Inhalation Daily   Continuous Infusions:  ceFEPime (MAXIPIME) IV 2 g (10/02/21 1000)   PRN Meds:.acetaminophen **OR** acetaminophen, ipratropium-albuterol, sodium chloride flush, sodium chloride flush      Subjective:   [name] was seen and examined today.  Sitting up in the chair, on 20 L O2 via HFNC.  Did not require BiPAP overnight.  Wife at the bedside.  No chest pain, fevers or chills.    Objective:   Vitals:   10/02/21 0918 10/02/21 0919  10/02/21 1148 10/02/21 1333  BP:   (!) 137/58   Pulse: 68  61 (!) 58  Resp: (!) 22  19 20   Temp:   98 F (36.7 C)   TempSrc:   Oral   SpO2: 93% 93% 95% 92%  Weight:      Height:        Intake/Output Summary (Last 24 hours) at 10/02/2021 1545 Last data filed at 10/02/2021 1309 Gross per 24 hour  Intake 594 ml  Output 1100 ml  Net -506 ml  Wt Readings from Last 3 Encounters:  10/02/21 96.3 kg  09/17/21 100.7 kg  09/09/21 101.2 kg   Physical Exam General: Alert and oriented x 3, NAD Cardiovascular: JVD+ 3/6 murmur Respiratory: Diminished breath sounds throughout with bibasilar crackles Gastrointestinal: Soft, nontender, nondistended, NBS Ext: 1-2+ pedal edema bilaterally Skin: Chronic venous stasis changes Psych: Normal affect and demeanor, alert and oriented x3   Data Reviewed:  I have personally reviewed following labs and imaging studies  Micro Results Recent Results (from the past 240 hour(s))  Blood Culture (routine x 2)     Status: None   Collection Time: 09/27/21 12:20 AM   Specimen: BLOOD  Result Value Ref Range Status   Specimen Description BLOOD PORTA CATH  Final   Special Requests   Final    BOTTLES DRAWN AEROBIC AND ANAEROBIC Blood Culture adequate volume   Culture   Final    NO GROWTH 5 DAYS Performed at [facility], [address] [place], [place] [address]    Report Status 10/02/2021 FINAL  Final  Resp Panel by RT-PCR (Flu A&B, Covid) Peripheral     Status: None   Collection Time: 09/27/21 12:25 AM   Specimen: Peripheral; Nasopharyngeal(NP) swabs in vial transport medium  Result Value Ref Range Status   SARS Coronavirus 2 by RT PCR NEGATIVE NEGATIVE Final    Comment: (NOTE) SARS-CoV-2 target nucleic acids are NOT DETECTED.  The SARS-CoV-2 RNA is generally detectable in upper respiratory specimens during the acute phase of infection. The lowest concentration of SARS-CoV-2 viral copies this assay can detect is 138 copies/mL. A  negative result does not preclude SARS-Cov-2 infection and should not be used as the sole basis for treatment or other patient management decisions. A negative result may occur with  improper specimen collection/handling, submission of specimen other than nasopharyngeal swab, presence of viral mutation(s) within the areas targeted by this assay, and inadequate number of viral copies(<138 copies/mL). A negative result must be combined with clinical observations, patient history, and epidemiological information. The expected result is Negative.  Fact Sheet for Patients:  EntrepreneurPulse.com.au  Fact Sheet for Healthcare Providers:  IncredibleEmployment.be  This test is no t yet approved or cleared by the [place] FDA and  has been authorized for detection and/or diagnosis of SARS-CoV-2 by FDA under an Emergency Use Authorization (EUA). This EUA will remain  in effect (meaning this test can be used) for the duration of the COVID-19 declaration under Section 564(b)(1) of the Act, 21 U.S.C.section 360bbb-3(b)(1), unless the authorization is terminated  or revoked sooner.       Influenza A by PCR NEGATIVE NEGATIVE Final   Influenza B by PCR NEGATIVE NEGATIVE Final    Comment: (NOTE) The Xpert Xpress SARS-CoV-2/FLU/RSV plus assay is intended as an aid in the diagnosis of influenza from Nasopharyngeal swab specimens and should not be used as a sole basis for treatment. Nasal washings and aspirates are unacceptable for Xpert Xpress SARS-CoV-2/FLU/RSV testing.  Fact Sheet for Patients: EntrepreneurPulse.com.au  Fact Sheet for Healthcare Providers: IncredibleEmployment.be  This test is not yet approved or cleared by the [place] FDA and has been authorized for detection and/or diagnosis of SARS-CoV-2 by FDA under an Emergency Use Authorization (EUA). This EUA will remain in effect (meaning this test can  be used) for the duration of the COVID-19 declaration under Section 564(b)(1) of the Act, 21 U.S.C. section 360bbb-3(b)(1), unless the authorization is terminated or revoked.  Performed at [facility], [place]  [address], [place], [place] [address]   Blood Culture (routine x 2)     Status: None   Collection Time: 09/27/21 12:38 AM   Specimen: BLOOD LEFT FOREARM  Result Value Ref Range Status   Specimen Description BLOOD LEFT FOREARM  Final   Special Requests   Final    BOTTLES DRAWN AEROBIC AND ANAEROBIC Blood Culture adequate volume   Culture   Final    NO GROWTH 5 DAYS Performed at [facility], [place] [address], [place], [place] [address]    Report Status 10/02/2021 FINAL  Final    Radiology Reports CT Abdomen Pelvis Wo Contrast  Result Date: 09/10/2021 CLINICAL DATA:  Primary Cancer Type: Lung Imaging Indication: Assess response to therapy Interval therapy since last imaging? Yes Initial Cancer Diagnosis Date: 08/06/2020; Established by: Biopsy-proven Detailed Pathology: Stage IV non-small cell lung cancer, mucinous adenocarcinoma. Primary Tumor location: Left upper lobe, right lower lobe. Surgeries: CABG 2016.  Cholecystectomy.  Multiple back surgeries. Chemotherapy: Yes; Ongoing?  No; Most recent administration: 01/2021 Immunotherapy?  Yes; Type: Keytruda; Ongoing? No Radiation therapy? No Other Cancer Therapies: Lumakras daily. EXAM: CT CHEST, ABDOMEN AND PELVIS WITHOUT CONTRAST TECHNIQUE: Multidetector CT imaging of the chest, abdomen and pelvis was performed following the standard protocol without IV contrast. COMPARISON:  Most recent CT chest, abdomen and pelvis 06/17/2021. 08/22/2020 PET-CT. FINDINGS: CT CHEST FINDINGS Cardiovascular: Right IJ Port-A-Cath terminates in the high right atrium. Atherosclerotic calcification of the aorta and aortic valve. Heart is enlarged. No pericardial effusion. Mediastinum/Nodes: 1.4 cm low-attenuation right thyroid nodule. No follow-up  recommended. (Ref: J Am Coll Radiol. 2015 Feb;12(2): 143-50).No pathologically enlarged mediastinal or axillary lymph nodes. Hilar regions are difficult to evaluate without IV contrast. Esophagus is grossly unremarkable. Lungs/Pleura: Post treatment parenchymal retraction, bronchiectasis and volume loss in the posteromedial mid and lower right hemithorax, as before. Small associated partially loculated right pleural effusion. Slight improvement in left upper lobe consolidation with associated architectural distortion. New peribronchovascular ground-glass in the left lower lobe (4/91), likely infectious or inflammatory in etiology. Subpleural reticular densities and ground-glass in the peripheral left lower lobe, similar. Trace left pleural fluid. Airway is unremarkable. Musculoskeletal: Degenerative changes in the spine. No worrisome lytic or sclerotic lesions. CT ABDOMEN PELVIS FINDINGS Hepatobiliary: Liver is unremarkable. Cholecystectomy. No biliary ductal dilatation. Pancreas: Negative. Spleen: Negative. Adrenals/Urinary Tract: Adrenal glands are unremarkable. 4.6 cm fluid density lesion off the lower pole right kidney is likely a cyst. Other smaller low-attenuation lesions in the kidneys are too small to characterize. There is a subtle 1.8 cm lesion the posterior left kidney (2/73), as on prior exams. No urinary stones. Ureters are decompressed. Bladder is grossly unremarkable. Stomach/Bowel: Stomach is unremarkable. Duodenal diverticula incidentally noted. Small bowel and colon unremarkable. Appendix is questionably visualized. Vascular/Lymphatic: Atherosclerotic calcification of the aorta. No pathologically enlarged lymph nodes. Reproductive: Prostate is visualized. Other: Slight haziness in the anterior left anatomic pelvis (2/100), nonspecific and without a definite source. Mesenteries and peritoneum are otherwise unremarkable. Musculoskeletal: 6.6 x 9.9 cm partially calcified soft tissue mass posterior to  the left sacroiliac joint and left sacrum, unchanged and possibly due to prior trauma. Degenerative changes in the spine. No worrisome lytic or sclerotic lesions. IMPRESSION: 1. Post treatment changes in the hemithoraces bilaterally. No evidence of recurrent or metastatic disease. 2. Subtle 1.8 cm lesion off the posterior interpolar left kidney, as on prior exams. Renal cell carcinoma can not be excluded. 3. Small right pleural effusion. 4.  Aortic atherosclerosis (ICD10-I70.0). Electronically Signed  By: Lorin Picket M.D.   On: 09/10/2021 10:50   CT Chest Wo Contrast  Result Date: 09/10/2021 CLINICAL DATA:  Primary Cancer Type: Lung Imaging Indication: Assess response to therapy Interval therapy since last imaging? Yes Initial Cancer Diagnosis Date: 08/06/2020; Established by: Biopsy-proven Detailed Pathology: Stage IV non-small cell lung cancer, mucinous adenocarcinoma. Primary Tumor location: Left upper lobe, right lower lobe. Surgeries: CABG 2016.  Cholecystectomy.  Multiple back surgeries. Chemotherapy: Yes; Ongoing?  No; Most recent administration: 01/2021 Immunotherapy?  Yes; Type: Keytruda; Ongoing? No Radiation therapy? No Other Cancer Therapies: Lumakras daily. EXAM: CT CHEST, ABDOMEN AND PELVIS WITHOUT CONTRAST TECHNIQUE: Multidetector CT imaging of the chest, abdomen and pelvis was performed following the standard protocol without IV contrast. COMPARISON:  Most recent CT chest, abdomen and pelvis 06/17/2021. 08/22/2020 PET-CT. FINDINGS: CT CHEST FINDINGS Cardiovascular: Right IJ Port-A-Cath terminates in the high right atrium. Atherosclerotic calcification of the aorta and aortic valve. Heart is enlarged. No pericardial effusion. Mediastinum/Nodes: 1.4 cm low-attenuation right thyroid nodule. No follow-up recommended. (Ref: J Am Coll Radiol. 2015 Feb;12(2): 143-50).No pathologically enlarged mediastinal or axillary lymph nodes. Hilar regions are difficult to evaluate without IV contrast.  Esophagus is grossly unremarkable. Lungs/Pleura: Post treatment parenchymal retraction, bronchiectasis and volume loss in the posteromedial mid and lower right hemithorax, as before. Small associated partially loculated right pleural effusion. Slight improvement in left upper lobe consolidation with associated architectural distortion. New peribronchovascular ground-glass in the left lower lobe (4/91), likely infectious or inflammatory in etiology. Subpleural reticular densities and ground-glass in the peripheral left lower lobe, similar. Trace left pleural fluid. Airway is unremarkable. Musculoskeletal: Degenerative changes in the spine. No worrisome lytic or sclerotic lesions. CT ABDOMEN PELVIS FINDINGS Hepatobiliary: Liver is unremarkable. Cholecystectomy. No biliary ductal dilatation. Pancreas: Negative. Spleen: Negative. Adrenals/Urinary Tract: Adrenal glands are unremarkable. 4.6 cm fluid density lesion off the lower pole right kidney is likely a cyst. Other smaller low-attenuation lesions in the kidneys are too small to characterize. There is a subtle 1.8 cm lesion the posterior left kidney (2/73), as on prior exams. No urinary stones. Ureters are decompressed. Bladder is grossly unremarkable. Stomach/Bowel: Stomach is unremarkable. Duodenal diverticula incidentally noted. Small bowel and colon unremarkable. Appendix is questionably visualized. Vascular/Lymphatic: Atherosclerotic calcification of the aorta. No pathologically enlarged lymph nodes. Reproductive: Prostate is visualized. Other: Slight haziness in the anterior left anatomic pelvis (2/100), nonspecific and without a definite source. Mesenteries and peritoneum are otherwise unremarkable. Musculoskeletal: 6.6 x 9.9 cm partially calcified soft tissue mass posterior to the left sacroiliac joint and left sacrum, unchanged and possibly due to prior trauma. Degenerative changes in the spine. No worrisome lytic or sclerotic lesions. IMPRESSION: 1. Post  treatment changes in the hemithoraces bilaterally. No evidence of recurrent or metastatic disease. 2. Subtle 1.8 cm lesion off the posterior interpolar left kidney, as on prior exams. Renal cell carcinoma can not be excluded. 3. Small right pleural effusion. 4.  Aortic atherosclerosis (ICD10-I70.0). Electronically Signed   By: Lorin Picket M.D.   On: 09/10/2021 10:50   DG Chest Port 1 View  Result Date: 09/30/2021 CLINICAL DATA:  Dyspnea EXAM: PORTABLE CHEST 1 VIEW COMPARISON:  09/28/2021, 09/26/2021, CT 09/09/2001 FINDINGS: Right-sided central venous port tip over the cavoatrial region. Cardiomegaly with vascular congestion. Suspected bilateral pleural effusions. Similar appearance of bilateral ground-glass opacities and patchy consolidations. No visible pneumothorax. IMPRESSION: Cardiomegaly with vascular congestion and suspected pleural effusions. Similar appearing bilateral ground-glass disease and patchy areas of consolidation Electronically Signed   By: Maudie Mercury  Francoise Ceo M.D.   On: 09/30/2021 16:32   DG CHEST PORT 1 VIEW  Result Date: 09/28/2021 CLINICAL DATA:  Dyspnea.  History of CHF EXAM: PORTABLE CHEST 1 VIEW COMPARISON:  09/28/2021 FINDINGS: Right-sided chest port remains in place. Cardiomegaly. Prior median sternotomy. Bilateral patchy airspace opacities persist, overall similar to the previous radiograph. Small right pleural effusion. No pneumothorax. IMPRESSION: Bilateral patchy airspace opacities persist, overall similar to previous radiograph. Electronically Signed   By: Davina Poke D.O.   On: 09/28/2021 15:28   DG CHEST PORT 1 VIEW  Result Date: 09/28/2021 CLINICAL DATA:  Shortness of breath EXAM: PORTABLE CHEST 1 VIEW COMPARISON:  Chest x-ray dated 09/26/2021 FINDINGS: Increased bilateral perihilar and bibasilar opacities, some components suggesting dense consolidation and some components of ground-glass opacity. Probable small RIGHT pleural effusion. No pneumothorax is seen.  Stable cardiomegaly. RIGHT chest wall Port-A-Cath is stable in position. Median sternotomy wires in place. IMPRESSION: 1. Increased bilateral perihilar and bibasilar opacities, including some components of ground-glass opacity, compatible with multifocal pneumonia versus pulmonary edema. 2. Probable small RIGHT pleural effusion. Electronically Signed   By: Franki Cabot M.D.   On: 09/28/2021 07:33   DG Chest Port 1 View  Result Date: 09/26/2021 CLINICAL DATA:  Respiratory distress. EXAM: PORTABLE CHEST 1 VIEW COMPARISON:  May 01, 2021 FINDINGS: There is stable right-sided venous Port-A-Cath positioning. Multiple sternal wires are seen. Mild, diffuse, chronic appearing increased interstitial lung markings are seen. Mild, stable areas of atelectasis are seen within the bilateral lung bases and mid left lung. Stable elevation of the right hemidiaphragm is seen. There is no evidence of a pleural effusion or pneumothorax. The cardiac silhouette is enlarged and unchanged in size. A radiopaque fusion plate and screws are seen overlying the cervical spine. IMPRESSION: Chronic appearing increased interstitial lung markings with mild, stable bibasilar and mid left lung atelectasis. Electronically Signed   By: Virgina Norfolk M.D.   On: 09/26/2021 23:11   ECHOCARDIOGRAM COMPLETE  Result Date: 09/29/2021    ECHOCARDIOGRAM REPORT   Patient Name:   Randall Wilkins Date of Exam: 09/29/2021 Medical Rec #:  [id number]      Height:       67.0 in Accession #:    [id number]     Weight:       214.9 lb Date of Birth:  1941-05-29      BSA:          2.085 m Patient Age:    11 years       BP:           120/58 mmHg Patient Gender: M              HR:           69 bpm. Exam Location:  Inpatient Procedure: 2D Echo, Color Doppler and Cardiac Doppler Indications:    B51.02 Acute systolic (congestive) heart failure  History:        Patient has prior history of Echocardiogram examinations, most                 recent 05/06/2021. CHF, Prior  CABG; Risk Factors:Hypertension,                 Diabetes and Dyslipidemia.  Sonographer:    Raquel Sarna Senior RDCS Referring Phys: [place]  1. Left ventricular ejection fraction, by estimation, is 45 to 50%. The left ventricle has mildly decreased function. The left ventricle demonstrates global hypokinesis. There is mild left ventricular  hypertrophy. Left ventricular diastolic parameters are consistent with Grade III diastolic dysfunction (restrictive). There is the interventricular septum is flattened in systole and diastole, consistent with right ventricular pressure and volume overload.  2. Right ventricular systolic function is normal. The right ventricular size is normal. There is moderately elevated pulmonary artery systolic pressure. The estimated right ventricular systolic pressure is 78.4 mmHg.  3. The mitral valve is normal in structure. Mild mitral valve regurgitation. No evidence of mitral stenosis.  4. Tricuspid valve regurgitation is severe.  5. The aortic valve is calcified. There is severe calcifcation of the aortic valve. There is severe thickening of the aortic valve. Aortic valve regurgitation is trivial. Moderate aortic valve stenosis. Aortic valve mean gradient measures 21.0 mmHg. Aortic valve Vmax measures 3.53 m/s.  6. The inferior vena cava is normal in size with greater than 50% respiratory variability, suggesting right atrial pressure of 3 mmHg. Comparison(s): Prior mean aortic valve gradient 17.5 mmHg. FINDINGS  Left Ventricle: Left ventricular ejection fraction, by estimation, is 45 to 50%. The left ventricle has mildly decreased function. The left ventricle demonstrates global hypokinesis. The left ventricular internal cavity size was normal in size. There is  mild left ventricular hypertrophy. The interventricular septum is flattened in systole and diastole, consistent with right ventricular pressure and volume overload. Left ventricular diastolic parameters are  consistent with Grade III diastolic dysfunction (restrictive). Right Ventricle: The right ventricular size is normal. No increase in right ventricular wall thickness. Right ventricular systolic function is normal. There is moderately elevated pulmonary artery systolic pressure. The tricuspid regurgitant velocity is 3.28 m/s, and with an assumed right atrial pressure of 8 mmHg, the estimated right ventricular systolic pressure is 69.6 mmHg. Left Atrium: Left atrial size was normal in size. Right Atrium: Right atrial size was normal in size. Pericardium: There is no evidence of pericardial effusion. Mitral Valve: The mitral valve is normal in structure. Mild mitral valve regurgitation. No evidence of mitral valve stenosis. MV peak gradient, 8.4 mmHg. The mean mitral valve gradient is 3.0 mmHg. Tricuspid Valve: The tricuspid valve is normal in structure. Tricuspid valve regurgitation is severe. No evidence of tricuspid stenosis. Aortic Valve: The aortic valve is calcified. There is severe calcifcation of the aortic valve. There is severe thickening of the aortic valve. Aortic valve regurgitation is trivial. Moderate aortic stenosis is present. Aortic valve mean gradient measures  21.0 mmHg. Aortic valve peak gradient measures 49.8 mmHg. Aortic valve area, by VTI measures 0.70 cm. Pulmonic Valve: The pulmonic valve was normal in structure. Pulmonic valve regurgitation is not visualized. No evidence of pulmonic stenosis. Aorta: The aortic root is normal in size and structure. Venous: The inferior vena cava is normal in size with greater than 50% respiratory variability, suggesting right atrial pressure of 3 mmHg. IAS/Shunts: No atrial level shunt detected by color flow Doppler.  LEFT VENTRICLE PLAX 2D LVIDd:         4.60 cm   Diastology LVIDs:         3.70 cm   LV e' medial:    9.11 cm/s LV PW:         1.30 cm   LV E/e' medial:  14.3 LV IVS:        1.20 cm   LV e' lateral:   11.30 cm/s LVOT diam:     1.90 cm   LV E/e'  lateral: 11.5 LV SV:         54 LV SV Index:   26 LVOT  Area:     2.84 cm  RIGHT VENTRICLE RV S prime:     10.40 cm/s TAPSE (M-mode): 1.6 cm LEFT ATRIUM             Index        RIGHT ATRIUM           Index LA diam:        3.80 cm 1.82 cm/m   RA Area:     22.40 cm LA Vol (A2C):   62.4 ml 29.93 ml/m  RA Volume:   68.40 ml  32.80 ml/m LA Vol (A4C):   49.3 ml 23.64 ml/m LA Biplane Vol: 55.5 ml 26.62 ml/m  AORTIC VALVE AV Area (Vmax):    0.80 cm AV Area (Vmean):   1.06 cm AV Area (VTI):     0.70 cm AV Vmax:           353.00 cm/s AV Vmean:          198.000 cm/s AV VTI:            0.765 m AV Peak Grad:      49.8 mmHg AV Mean Grad:      21.0 mmHg LVOT Vmax:         99.80 cm/s LVOT Vmean:        73.900 cm/s LVOT VTI:          0.190 m LVOT/AV VTI ratio: 0.25  AORTA Ao Root diam: 3.10 cm Ao Asc diam:  3.10 cm MITRAL VALVE                TRICUSPID VALVE MV Area (PHT): 5.02 cm     TR Peak grad:   43.0 mmHg MV Area VTI:   1.78 cm     TR Vmax:        328.00 cm/s MV Peak grad:  8.4 mmHg MV Mean grad:  3.0 mmHg     SHUNTS MV Vmax:       1.45 m/s     Systemic VTI:  0.19 m MV Vmean:      87.3 cm/s    Systemic Diam: 1.90 cm MV Decel Time: 151 msec MV E velocity: 130.00 cm/s MV A velocity: 94.90 cm/s MV E/A ratio:  1.37 Candee Furbish MD Electronically signed by Candee Furbish MD Signature Date/Time: 09/29/2021/5:46:56 PM    Final     Lab Data:  CBC: Recent Labs  Lab 09/27/21 0020 09/27/21 0407 09/28/21 0449 09/29/21 0622 09/30/21 0515 10/01/21 0529  WBC 8.1 6.9 8.2 9.8 7.2  --   NEUTROABS 6.3 4.7  --   --   --   --   HGB 8.7* 8.8* 9.8* 9.7* 8.9* 8.8*  HCT 29.4* 28.8* 32.4* 31.6* 29.9* 29.6*  MCV 96.4 95.0 94.5 93.5 95.2  --   PLT 121* 96* 102* 110* 110*  --    Basic Metabolic Panel: Recent Labs  Lab 09/27/21 0407 09/28/21 0449 09/28/21 0930 09/29/21 0703 09/30/21 0515 10/01/21 0529  NA 137 138 139 136 139 133*  K 4.3 3.7 3.6 3.7 3.7 4.1  CL 105 103 102 102 104 101  CO2 24 25 26 24 26 23   GLUCOSE  169* 105* 199* 141* 142* 223*  BUN 38* 35* 35* 36* 41* 48*  CREATININE 2.27* 1.99* 1.86* 1.76* 1.78* 1.93*  CALCIUM 8.4* 8.5* 8.5* 8.6* 8.6* 8.2*  MG 2.2 2.0  --  2.3 2.0 2.2  PHOS  --  4.3  --  4.2 3.7 4.5  GFR: Estimated Creatinine Clearance: 33.8 mL/min (A) (by C-G formula based on SCr of 1.93 mg/dL (H)). Liver Function Tests: Recent Labs  Lab 09/27/21 0020 09/27/21 0407 09/28/21 0449 09/29/21 0703 09/30/21 0515 10/01/21 0529  AST 17 15  --   --   --   --   ALT 11 8  --   --   --   --   ALKPHOS 92 89  --   --   --   --   BILITOT 0.9 0.7  --   --   --   --   PROT 5.8* 5.6*  --   --   --   --   ALBUMIN 2.8* 2.6* 2.7* 2.6* 2.2* 2.2*   No results for input(s): LIPASE, AMYLASE in the last 168 hours. No results for input(s): AMMONIA in the last 168 hours. Coagulation Profile: Recent Labs  Lab 09/27/21 0020  INR 1.8*   Cardiac Enzymes: Recent Labs  Lab 09/28/21 0449  CKTOTAL 38*   BNP (last 3 results) No results for input(s): PROBNP in the last 8760 hours. HbA1C: No results for input(s): HGBA1C in the last 72 hours. CBG: Recent Labs  Lab 10/01/21 1559 10/01/21 2125 10/02/21 0614 10/02/21 0839 10/02/21 1148  GLUCAP 196* 195* 178* 241* 204*   Lipid Profile: No results for input(s): CHOL, HDL, LDLCALC, TRIG, CHOLHDL, LDLDIRECT in the last 72 hours. Thyroid Function Tests: No results for input(s): TSH, T4TOTAL, FREET4, T3FREE, THYROIDAB in the last 72 hours. Anemia Panel: No results for input(s): VITAMINB12, FOLATE, FERRITIN, TIBC, IRON, RETICCTPCT in the last 72 hours. Urine analysis:    Component Value Date/Time   COLORURINE YELLOW 09/27/2021 0021   APPEARANCEUR CLEAR 09/27/2021 0021   LABSPEC 1.011 09/27/2021 0021   PHURINE 5.0 09/27/2021 0021   GLUCOSEU NEGATIVE 09/27/2021 0021   GLUCOSEU NEGATIVE 08/29/2019 0959   HGBUR NEGATIVE 09/27/2021 0021   BILIRUBINUR NEGATIVE 09/27/2021 0021   KETONESUR NEGATIVE 09/27/2021 0021   PROTEINUR 30 (A) 09/27/2021  0021   UROBILINOGEN 0.2 08/29/2019 0959   NITRITE NEGATIVE 09/27/2021 0021   LEUKOCYTESUR NEGATIVE 09/27/2021 0021     Marilyne Haseley M.D. [facility] 10/02/2021, 3:45 PM  Available via Epic secure chat 7am-7pm After 7 pm, please refer to night coverage provider listed on amion.

## 2021-10-02 NOTE — Progress Notes (Signed)
Physical Therapy Treatment Patient Details Name:  Randall Wilkins MRN: [id number] DOB: 1941-07-14 Today'Randall Date: 10/02/2021   History of Present Illness 80 year old male presents to the ER via EMS 09/26/21 with a 2 to 5-7-day history of worsening weakness resulting in fall when trying to when transferring from wheelchair back to bed. At baseline pt was ambulating with RW, Pt with 1 day history of shortness of breath. On arrival to the ER, sats were 88% on 6 L.  He dropped to 52% on room air. Wife states pt was scheduled for TEE/cardioversion due to afib next week. Admitted for treatment of Acute on chronic combined systolic and diastolic CHF. Pt requiring bipap 09/28/21  PMH: stage IV lung cancer, type 1 diabetes, obesity, chronic hypoxic respiratory failure, combined systolic diastolic heart failure, atrial fibrillation, chronic anticoagulation    PT Comments    Pt limited to bedroom mobility due to being on [place] this date. In addition, pt displays weakness in his quadriceps and gluteal muscles, resulting in his posterior trunk sway and tendency for his knees to buckle. He is at high risk for falls due to his muscular weakness and decreased aerobic endurance/activity tolerance, see General Comments below in regards to SpO2 during session. Pt continues to require minA for transfers and short gait bouts. Performed several standing and seated exercises to address his muscle weakness impacting his functional mobility. Encouraged performing seated marches and LAQs during day to address weakness. Will continue to follow acutely. At this time, continuing to recommend follow-up with HHPT as pt has had good success with PT previously and pt does live with son who can assist him physically also.     Recommendations for follow up therapy are one component of a multi-disciplinary discharge planning process, led by the attending physician.  Recommendations may be updated based on patient status, additional functional  criteria and insurance authorization.  Follow Up Recommendations  Home health PT;Supervision/Assistance - 24 hour     Equipment Recommendations  None recommended by PT    Recommendations for Other Services       Precautions / Restrictions Precautions Precautions: Fall Precaution Comments: fall prior to hospitalization; [place] Restrictions Weight Bearing Restrictions: No     Mobility  Bed Mobility               General bed mobility comments: Pt sitting up in chair upon arrival.    Transfers Overall transfer level: Needs assistance Equipment used: Rolling walker (2 wheeled) Transfers: Sit to/from Stand Sit to Stand: Min assist         General transfer comment: Pt pushing up from recliner to stand, 3x, minA to power up at end and steady pt during transition of hands from recliner to RW as knee buckling noted.  Ambulation/Gait Ambulation/Gait assistance: Min assist Gait Distance (Feet): 16 Feet Assistive device: Rolling walker (2 wheeled) Gait Pattern/deviations: Step-through pattern;Decreased step length - right;Decreased step length - left;Trunk flexed;Shuffle Gait velocity: slowed Gait velocity interpretation: <1.31 ft/sec, indicative of household ambulator General Gait Details: Pt with posterior trunk sway and noted knee buckling. MinA to prevent LOB and to provide tactile and verbal cues to contract either quad during stance to prevent knee buckling. Cues provided to extend hips. Pt fatigues quickly with sats decreasing to 80s%, thus returned to sit with sats decreasing to as low as 78% with several minutes of pursed lip breathing needed to rebound to 90s%   Chief Strategy Officer  Modified Rankin (Stroke Patients Only)       Balance Overall balance assessment: Needs assistance Sitting-balance support: Feet supported;No upper extremity supported Sitting balance-Leahy Scale: Fair   Postural control: Posterior lean Standing  balance support: Bilateral upper extremity supported;During functional activity Standing balance-Leahy Scale: Poor Standing balance comment: Requires RW and minA to stand due to knee buckling and posterior trunk sway.                            Cognition Arousal/Alertness: Lethargic Behavior During Therapy: Flat affect Overall Cognitive Status: Impaired/Different from baseline Area of Impairment: Memory;Following commands;Problem solving                     Memory: Decreased short-term memory Following Commands: Follows one step commands with increased time;Follows multi-step commands with increased time     Problem Solving: Slow processing;Decreased initiation;Difficulty sequencing;Requires verbal cues General Comments: Pt lethargic, needing increased time to process cues and initiate.      Exercises General Exercises - Lower Extremity Long Arc Quad: Strengthening;Both;10 reps;Seated (needed x2 rest breaks to complete 10 reps) Hip Flexion/Marching: Strengthening;Both;10 reps;Standing (with RW, min feet clearance noted despite cues) Mini-Sqauts: Strengthening;Both;10 reps;Standing (with RW)    General Comments General comments (skin integrity, edema, etc.): On HHFNC 18L 50% FiO2, SpO2 decreasing to 80s% when ambulating or performing exercises, thus returned pt to sit with SpO2 decreasing to as low as 78% with several minutes of pursed lip breathing needed to rebound to 90s%      Pertinent Vitals/Pain Pain Assessment: No/denies pain    Home Living                      Prior Function            PT Goals (current goals can now be found in the care plan section) Acute Rehab PT Goals Patient Stated Goal: to get stronger PT Goal Formulation: With patient/family Time For Goal Achievement: 10/12/21 Potential to Achieve Goals: Fair Progress towards PT goals: Not progressing toward goals - comment (decline in resp status)    Frequency    Min  3X/week      PT Plan Current plan remains appropriate    Co-evaluation              AM-PAC PT "6 Clicks" Mobility   Outcome Measure  Help needed turning from your back to your side while in a flat bed without using bedrails?: A Little Help needed moving from lying on your back to sitting on the side of a flat bed without using bedrails?: A Little Help needed moving to and from a bed to a chair (including a wheelchair)?: A Little Help needed standing up from a chair using your arms (e.g., wheelchair or bedside chair)?: A Little Help needed to walk in hospital room?: A Little Help needed climbing 3-5 steps with a railing? : Total 6 Click Score: 16    End of Session Equipment Utilized During Treatment: Gait belt;Oxygen Activity Tolerance: Patient limited by fatigue Patient left: in chair;with call bell/phone within reach;with family/visitor present   PT Visit Diagnosis: Unsteadiness on feet (R26.81);Repeated falls (R29.6);Muscle weakness (generalized) (M62.81);History of falling (Z91.81);Difficulty in walking, not elsewhere classified (R26.2);Other abnormalities of gait and mobility (R26.89)     Time: 2836-6294 PT Time Calculation (min) (ACUTE ONLY): 29 min  Charges:  $Gait Training: 8-22 mins $Therapeutic Exercise: 8-22 mins  Moishe Spice, PT, DPT Acute Rehabilitation Services  Pager: [phone] Office: [place] 10/02/2021, 11:10 AM

## 2021-10-02 NOTE — Progress Notes (Signed)
Occupational Therapy Treatment Patient Details Name: Randall Wilkins MRN: [id number] DOB: 09-24-1941 Today's Date: 10/02/2021   History of present illness 80 year old male presents to the ER via EMS 09/26/21 with a 2 to 5-7-day history of worsening weakness resulting in fall when trying to when transferring from wheelchair back to bed. At baseline pt was ambulating with RW, Pt with 1 day history of shortness of breath. On arrival to the ER, sats were 88% on 6 L.  He dropped to 52% on room air. Wife states pt was scheduled for TEE/cardioversion due to afib next week. Admitted for treatment of Acute on chronic combined systolic and diastolic CHF. Pt requiring bipap 09/28/21  PMH: stage IV lung cancer, type 1 diabetes, obesity, chronic hypoxic respiratory failure, combined systolic diastolic heart failure, atrial fibrillation, chronic anticoagulation   OT comments  Assisted pt from bed<>BSC with min assist and RW. Completed grooming in sitting. Pt reliant on HHFNC 18L 50% Fi02 and pursed lip breathing to recover from Sp02 dropping to low 80's with activity. 02 demand precluding progress in mobility and ADL.    Recommendations for follow up therapy are one component of a multi-disciplinary discharge planning process, led by the attending physician.  Recommendations may be updated based on patient status, additional functional criteria and insurance authorization.    Follow Up Recommendations  Home health OT    Equipment Recommendations  None recommended by OT    Recommendations for Other Services      Precautions / Restrictions Precautions Precautions: Fall Precaution Comments: fall prior to hospitalization; HHFNC       Mobility Bed Mobility Overal bed mobility: Needs Assistance Bed Mobility: Supine to Sit;Sit to Supine     Supine to sit: Min assist Sit to supine: Min assist   General bed mobility comments: assist to raise trunk and for LEs back into bed    Transfers                       Balance Overall balance assessment: Needs assistance   Sitting balance-Leahy Scale: Fair     Standing balance support: Bilateral upper extremity supported;During functional activity Standing balance-Leahy Scale: Poor Standing balance comment: Requires RW and minA to stand due to knee buckling and posterior trunk bias                           ADL either performed or assessed with clinical judgement   ADL Overall ADL's : Needs assistance/impaired     Grooming: Set up;Sitting;Wash/dry hands                   Toilet Transfer: Minimal assistance;Stand-pivot;RW;BSC   Toileting- Clothing Manipulation and Hygiene: Sit to/from stand;Minimal assistance               Vision       Perception     Praxis      Cognition Arousal/Alertness: Awake/alert Behavior During Therapy: Flat affect Overall Cognitive Status: Impaired/Different from baseline Area of Impairment: Memory;Following commands;Problem solving                     Memory: Decreased short-term memory Following Commands: Follows one step commands with increased time;Follows multi-step commands with increased time     Problem Solving: Slow processing;Decreased initiation;Difficulty sequencing;Requires verbal cues          Exercises     Shoulder Instructions       General Comments  Pertinent Vitals/ Pain       Pain Assessment: No/denies pain  Home Living                                          Prior Functioning/Environment              Frequency  Min 2X/week        Progress Toward Goals  OT Goals(current goals can now be found in the care plan section)  Progress towards OT goals: Progressing toward goals  Acute Rehab OT Goals Patient Stated Goal: to get stronger OT Goal Formulation: With patient Time For Goal Achievement: 10/13/21 Potential to Achieve Goals: Good  Plan Discharge plan remains appropriate     Co-evaluation                 AM-PAC OT "6 Clicks" Daily Activity     Outcome Measure   Help from another person eating meals?: None Help from another person taking care of personal grooming?: A Little Help from another person toileting, which includes using toliet, bedpan, or urinal?: A Little Help from another person bathing (including washing, rinsing, drying)?: A Lot Help from another person to put on and taking off regular upper body clothing?: A Little Help from another person to put on and taking off regular lower body clothing?: A Lot 6 Click Score: 17    End of Session Equipment Utilized During Treatment: Gait belt;Rolling walker;Oxygen  OT Visit Diagnosis: Unsteadiness on feet (R26.81);Other abnormalities of gait and mobility (R26.89);Muscle weakness (generalized) (M62.81);Other symptoms and signs involving cognitive function   Activity Tolerance Patient tolerated treatment well   Patient Left in bed;with call bell/phone within reach;with family/visitor present   Nurse Communication          Time: 8657-8469 OT Time Calculation (min): 16 min  Charges: OT General Charges $OT Visit: 1 Visit OT Treatments $Self Care/Home Management : 8-22 mins  Nestor Lewandowsky, OTR/L Acute Rehabilitation Services Pager: [phone] Office: [phone]  Malka So 10/02/2021, 3:48 PM

## 2021-10-02 NOTE — Progress Notes (Addendum)
Progress Note  Patient Name: Randall Wilkins Date of Encounter: 10/02/2021  [facility] Cardiologist: Pixie Casino, MD   Subjective   Somnolent after working with physical therapy this morning. Wife at bedside assists with history. Patient reports breathing is overall improved but far from baseline. No chest pain or palpitations.   Inpatient Medications    Scheduled Meds:  allopurinol  100 mg Oral Daily   amiodarone  200 mg Oral BID   apixaban  2.5 mg Oral BID   arformoterol  15 mcg Nebulization BID   budesonide (PULMICORT) nebulizer solution  0.5 mg Nebulization BID   Chlorhexidine Gluconate Cloth  6 each Topical Daily   furosemide  20 mg Oral Daily   insulin aspart  0-15 Units Subcutaneous TID WC   insulin aspart  0-5 Units Subcutaneous QHS   levothyroxine  88 mcg Oral Q0600   metroNIDAZOLE  500 mg Oral Q12H   rosuvastatin  20 mg Oral Daily   sodium chloride flush  10 mL Intravenous Q12H   sodium chloride flush  10-40 mL Intracatheter Q12H   sotorasib  960 mg Oral Daily   tamsulosin  0.4 mg Oral QHS   umeclidinium bromide  1 puff Inhalation Daily   Continuous Infusions:  ceFEPime (MAXIPIME) IV 2 g (10/02/21 1000)   PRN Meds: acetaminophen **OR** acetaminophen, ipratropium-albuterol, sodium chloride flush, sodium chloride flush   Vital Signs    Vitals:   10/01/21 1959 10/02/21 0414 10/02/21 0918 10/02/21 0919  BP: (!) 120/54 135/68    Pulse: 60 69 68   Resp: 20 20 (!) 22   Temp: 97.7 F (36.5 C) 98.3 F (36.8 C)    TempSrc: Oral Oral    SpO2: 97% 93% 93% 93%  Weight:  96.3 kg    Height:        Intake/Output Summary (Last 24 hours) at 10/02/2021 1002 Last data filed at 10/02/2021 0528 Gross per 24 hour  Intake 836 ml  Output 2000 ml  Net -1164 ml   Last 3 Weights 10/02/2021 10/01/2021 09/30/2021  Weight (lbs) 212 lb 4.9 oz 209 lb 7 oz 208 lb 8.9 oz  Weight (kg) 96.3 kg 95 kg 94.6 kg       ECG    No new tracings - Personally  Reviewed  Physical Exam   GEN: Ill appearing elderly gentleman sitting in bedside chair in NAD  Neck: No JVD Cardiac: RRR, + murmurs, rubs, or gallops.  Respiratory: diminished breath sounds bilaterally GI: Soft, nontender, non-distended  MS: 1-2+ pitting to thighs edema; No deformity. Neuro:  Nonfocal  Psych: Normal affect   Labs    High Sensitivity Troponin:   Recent Labs  Lab 09/27/21 0020 09/27/21 0156 09/28/21 1658 09/30/21 1715 09/30/21 1837  TROPONINIHS 76* 83* 68* 60* 77*     Chemistry Recent Labs  Lab 09/27/21 0020 09/27/21 0407 09/28/21 0449 09/29/21 0703 09/30/21 0515 10/01/21 0529  NA 137 137   < > 136 139 133*  K 4.2 4.3   < > 3.7 3.7 4.1  CL 103 105   < > 102 104 101  CO2 23 24   < > 24 26 23   GLUCOSE 145* 169*   < > 141* 142* 223*  BUN 39* 38*   < > 36* 41* 48*  CREATININE 2.22* 2.27*   < > 1.76* 1.78* 1.93*  CALCIUM 8.4* 8.4*   < > 8.6* 8.6* 8.2*  MG  --  2.2   < > 2.3  2.0 2.2  PROT 5.8* 5.6*  --   --   --   --   ALBUMIN 2.8* 2.6*   < > 2.6* 2.2* 2.2*  AST 17 15  --   --   --   --   ALT 11 8  --   --   --   --   ALKPHOS 92 89  --   --   --   --   BILITOT 0.9 0.7  --   --   --   --   GFRNONAA 29* 28*   < > 39* 38* 35*  ANIONGAP 11 8   < > 10 9 9    < > = values in this interval not displayed.    Lipids No results for input(s): CHOL, TRIG, HDL, LABVLDL, LDLCALC, CHOLHDL in the last 168 hours.  Hematology Recent Labs  Lab 09/28/21 0449 09/29/21 0622 09/30/21 0515 10/01/21 0529  WBC 8.2 9.8 7.2  --   RBC 3.43* 3.38* 3.14*  --   HGB 9.8* 9.7* 8.9* 8.8*  HCT 32.4* 31.6* 29.9* 29.6*  MCV 94.5 93.5 95.2  --   MCH 28.6 28.7 28.3  --   MCHC 30.2 30.7 29.8*  --   RDW 17.3* 17.2* 17.2*  --   PLT 102* 110* 110*  --    Thyroid No results for input(s): TSH, FREET4 in the last 168 hours.  BNP Recent Labs  Lab 09/27/21 0020 09/28/21 0449 09/30/21 0515  BNP 1,183.2* 1,361.1* 723.3*    DDimer No results for input(s): DDIMER in the last 168  hours.   Radiology    DG Chest Port 1 View  Result Date: 09/30/2021 CLINICAL DATA:  Dyspnea EXAM: PORTABLE CHEST 1 VIEW COMPARISON:  09/28/2021, 09/26/2021, CT 09/09/2001 FINDINGS: Right-sided central venous port tip over the cavoatrial region. Cardiomegaly with vascular congestion. Suspected bilateral pleural effusions. Similar appearance of bilateral ground-glass opacities and patchy consolidations. No visible pneumothorax. IMPRESSION: Cardiomegaly with vascular congestion and suspected pleural effusions. Similar appearing bilateral ground-glass disease and patchy areas of consolidation Electronically Signed   By: Donavan Foil M.D.   On: 09/30/2021 16:32    Cardiac Studies   Echocardiogram 09/29/21: 1. Left ventricular ejection fraction, by estimation, is 45 to 50%. The  left ventricle has mildly decreased function. The left ventricle  demonstrates global hypokinesis. There is mild left ventricular  hypertrophy. Left ventricular diastolic parameters  are consistent with Grade III diastolic dysfunction (restrictive). There  is the interventricular septum is flattened in systole and diastole,  consistent with right ventricular pressure and volume overload.   2. Right ventricular systolic function is normal. The right ventricular  size is normal. There is moderately elevated pulmonary artery systolic  pressure. The estimated right ventricular systolic pressure is 18.8 mmHg.   3. The mitral valve is normal in structure. Mild mitral valve  regurgitation. No evidence of mitral stenosis.   4. Tricuspid valve regurgitation is severe.   5. The aortic valve is calcified. There is severe calcifcation of the  aortic valve. There is severe thickening of the aortic valve. Aortic valve  regurgitation is trivial. Moderate aortic valve stenosis. Aortic valve  mean gradient measures 21.0 mmHg.  Aortic valve Vmax measures 3.53 m/s.   6. The inferior vena cava is normal in size with greater than 50%   respiratory variability, suggesting right atrial pressure of 3 mmHg.   Comparison(s): Prior mean aortic valve gradient 17.5 mmHg.   Patient Profile     80 y.o.  male with a PMH of CAD s/p PCI/DES to RCA in 2005, chronic combined CHF, persistent atrial fibrillation/flutter, HTN, DM type 1, CVA, chronic respiratory failure on O2 2-3L with lung cancer stage IV, CKD stage 3, who is being followed by cardiology for the evaluation of CHF/Afib.   Assessment & Plan    1. Acute on chronic respiratory failure: patient presented with worsening SOB and hypoxia. Echo this admission with EF 45-50%, G3DD, mild MR, moderate AS. Felt to be multifactorial in the setting of CHF, AS, chronic respiratory failure requiring home O2, lung cancer, and possible PNA vs lymphangitic spread. Seen by [facility] 09/30/21 and recommended for ongoing supportive care and palliative care approach. He is on po lasix 20mg  daily, previously IV lasix 60mg -80mg  daily, with UOP net -1.1L in the past 24 hours and -6.6L this admission. Weight is up to 212lbs today from 209lbs yesterday. Cr up to 1.9 yesterday from 1.7, though down from 2.2 on admission (baseline appears to be 1.4-1.5).  - Will give additional lasix 20mg  po this afternoon - Continue antibiotics and supportive care per primary team - Wean O2 via HFNC as tolerated.  - Continue outpatient follow-up with oncology for management of his lung cancer. On lumakras  2. Persistent atrial fibrillation/flutter: initially planned for DCCV 10/01/21, though this was canceled given ongoing increased O2 via HFNC demands. Rates are stable - Continue amiodarone for rhythm control - Continue eliquis for stroke ppx  3. Aortic stenosis: moderate on echo this admission.  - Avoid dehydration - Continue outpatient monitoring if he makes a meaningful recovery and this is in line with his GOC.  4. AoCKD stage 3a: Cr 1.4-1.5 at baseline. Elevated to 2.2 on arrival, down to 1.7 09/30/21, now up to 1.9  today.  - Continue to monitor closely with diuresis.       For questions or updates, please contact [place] Please consult [email or web address] for contact info under        Signed, Abigail Butts, PA-C  10/02/2021, 10:02 AM    Patient examined chart reviewed. Still with significant oxygen requirement Normal home oxygen 2L. Hypoxia from pneumonia and fluid overload. Latter much improved. His afib is stable with good rate control On amiodarone per Dr Curt Bears and renal dose eliquis with no missed doses. Annabella cancelled 10/19 due to pulmonary status. Not sure it will need to be done this admission Renal function stable with diuresis   Jenkins Rouge MD [facility]

## 2021-10-03 DIAGNOSIS — C3491 Malignant neoplasm of unspecified part of right bronchus or lung: Secondary | ICD-10-CM | POA: Diagnosis not present

## 2021-10-03 DIAGNOSIS — J810 Acute pulmonary edema: Secondary | ICD-10-CM

## 2021-10-03 DIAGNOSIS — I4891 Unspecified atrial fibrillation: Secondary | ICD-10-CM | POA: Diagnosis not present

## 2021-10-03 DIAGNOSIS — G9340 Encephalopathy, unspecified: Secondary | ICD-10-CM

## 2021-10-03 DIAGNOSIS — I4892 Unspecified atrial flutter: Secondary | ICD-10-CM | POA: Diagnosis not present

## 2021-10-03 DIAGNOSIS — J9601 Acute respiratory failure with hypoxia: Secondary | ICD-10-CM | POA: Diagnosis not present

## 2021-10-03 DIAGNOSIS — Z515 Encounter for palliative care: Secondary | ICD-10-CM | POA: Diagnosis not present

## 2021-10-03 DIAGNOSIS — I5043 Acute on chronic combined systolic (congestive) and diastolic (congestive) heart failure: Secondary | ICD-10-CM | POA: Diagnosis not present

## 2021-10-03 LAB — GLUCOSE, CAPILLARY
Glucose-Capillary: 170 mg/dL — ABNORMAL HIGH (ref 70–99)
Glucose-Capillary: 237 mg/dL — ABNORMAL HIGH (ref 70–99)
Glucose-Capillary: 168 mg/dL — ABNORMAL HIGH (ref 70–99)
Glucose-Capillary: 124 mg/dL — ABNORMAL HIGH (ref 70–99)

## 2021-10-03 MED ORDER — QUETIAPINE FUMARATE 50 MG PO TABS
50.0000 mg | ORAL_TABLET | Freq: Every day | ORAL | Status: DC
  Administered 2021-10-03 – 2021-10-06 (×4): 50 mg via ORAL
  Filled 2021-10-03 (×4): qty 1

## 2021-10-03 NOTE — Progress Notes (Signed)
[facility]                                                                              Patient Demographics  Randall Wilkins, is a 80 y.o. male, DOB - 04-18-1941, ASN:[id number]  Admit date - 09/26/2021   Admitting Physician Kristopher Oppenheim, DO  Outpatient Primary MD for the patient is Randall Arabian, MD  Outpatient specialists:   LOS - 6  days   Medical records reviewed and are as summarized below:    No chief complaint on file.      Brief summary   Patient is a 80 year old male with history of combined CHF, stage IV lung CA, chronic hypoxic respiratory failure on 2 to 3 L O2 at home, paroxysmal atrial fibrillation, diabetes mellitus, CAD, obesity, hearing deficit presented with progressive weakness and acute shortness of breath.  He was admitted with acute on chronic hypoxic respiratory failure due to CHF exacerbation.  He was hypoxic to 52% on room air requiring up to 6 L.  BNP elevated to 1200.  Chest x-ray showed pulmonary edema.  Troponin 76> 83.  EKG showed rate controlled A. fib with occasional PVCs.  Patient was started on IV Lasix. Patient was noted to have respiratory distress, increasing oxygen requirement, worsening chest x-ray, placed on IV antibiotics, broadened to IV cefepime and Flagyl. On 10/18, required BiPAP and pulmonology was consulted. Cardiology and palliative medicine also following  Assessment & Plan    Principal Problem: Acute on chronic hypoxic respiratory failure on 2 to 3 L O2 at baseline Acute on chronic combined systolic and diastolic CHF -On 19/37, had respiratory distress requiring BiPAP, currently off.  On 15L O2 HFNC -Has history of OSA, previously was using CPAP at home (not lately) -Pulmonology was consulted, likely multifactorial due to CHF, third spacing, lymphangitic spread versus pneumonia, also recommended palliative approach, diuresis, antibiotics. -Continues remain hypoxic, on 20 L HFNC (2-3L outpatient) -Still volume  overloaded, discussed with cardiology. Dr Randall Wilkins, no indication of inotropic support at this time given good diuresis and stable renal function.  Had recommended pulmonology given unable to wean O2 down.   -Patient also has stage IV lung CA, appreciate pulmonology evaluation today and also recommended overall poor prognosis and palliative approach -Discussed in detail with the patient's wife for goals of care, she understands overall poor prognosis, he has been declining, may not recover or able to wean down O2.  Notified oncology, Dr. Julien Wilkins and palliative medicine -Discussed about home with hospice versus residential hospice, wife will discuss with patient and children.     Active Problems: Paroxysmal atrial fibrillation -DCCV canceled due to tenuous respiratory status  -Continue amiodarone, Eliquis    CAD S/P percutaneous coronary angioplasty, aortic stenosis -Currently no chest pain, status post CABG 2016.  -Continue statin  History of lung CA, adenocarcinoma right lung stage IV -Oncology notified, message sent to Dr. Julien Wilkins regarding worsening respiratory status    Type 1 diabetes mellitus with nephropathy ([place]), IDDM with neuropathy, hyperglycemia, CKD -CBGs elevated, continue moderate sliding scale insulin  AKI on chronic kidney disease (CKD), stage III (moderate) (HCC) - Baseline creatinine 1.7-1.9 -Creatinine still at  baseline     Essential hypertension -BP stable  BPH -Continue Flomax  Anemia of chronic disease normocytic -H&H currently stable,  Obesity Estimated body mass index is 32.25 kg/m as calculated from the following:   Height as of this encounter: 5\' 7"  (1.702 m).   Weight as of this encounter: 93.4 kg.  Code Status: DNR DVT Prophylaxis:  apixaban (ELIQUIS) tablet 2.5 mg Start: 09/30/21 2200 SCDs Start: 09/27/21 0317 apixaban (ELIQUIS) tablet 2.5 mg   Level of Care: Level of care: Telemetry Medical Family Communication: Updated patient's wife at the  bedside and goals of care addressed.   Disposition Plan:     Status is: Inpatient  Remains inpatient appropriate because: Still hypoxic, on 20L O2 HFNC, baseline at home 2 to 3 L.  Time Spent in minutes 53mins   Procedures:    Consultants:   Cardiology Pulmonology Palliative medicine  Antimicrobials:   Anti-infectives (From admission, onward)    Start     Dose/Rate Route Frequency Ordered Stop   09/30/21 1000  metroNIDAZOLE (FLAGYL) tablet 500 mg        500 mg Oral Every 12 hours 09/30/21 0838 10/04/21 2359   09/28/21 1800  metroNIDAZOLE (FLAGYL) IVPB 500 mg  Status:  Discontinued        500 mg 100 mL/hr over 60 Minutes Intravenous Every 12 hours 09/28/21 1520 09/30/21 0838   09/28/21 1630  ceFEPIme (MAXIPIME) 2 g in sodium chloride 0.9 % 100 mL IVPB        2 g 200 mL/hr over 30 Minutes Intravenous Every 12 hours 09/28/21 1537 10/04/21 2359   09/27/21 0930  cefTRIAXone (ROCEPHIN) 1 g in sodium chloride 0.9 % 100 mL IVPB  Status:  Discontinued        1 g 200 mL/hr over 30 Minutes Intravenous Every 24 hours 09/27/21 0839 09/28/21 1520          Medications  Scheduled Meds:  allopurinol  100 mg Oral Daily   amiodarone  200 mg Oral BID   apixaban  2.5 mg Oral BID   arformoterol  15 mcg Nebulization BID   budesonide (PULMICORT) nebulizer solution  0.5 mg Nebulization BID   Chlorhexidine Gluconate Cloth  6 each Topical Daily   furosemide  20 mg Oral Daily   insulin aspart  0-15 Units Subcutaneous TID WC   insulin aspart  0-5 Units Subcutaneous QHS   levothyroxine  88 mcg Oral Q0600   metroNIDAZOLE  500 mg Oral Q12H   rosuvastatin  20 mg Oral Daily   sodium chloride flush  10 mL Intravenous Q12H   sodium chloride flush  10-40 mL Intracatheter Q12H   sotorasib  960 mg Oral Daily   tamsulosin  0.4 mg Oral QHS   umeclidinium bromide  1 puff Inhalation Daily   Continuous Infusions:  ceFEPime (MAXIPIME) IV 2 g (10/03/21 0856)   PRN Meds:.acetaminophen **OR**  acetaminophen, ipratropium-albuterol, sodium chloride flush, sodium chloride flush      Subjective:   Adar Wilkins was seen and examined today. Did not sleep well at night, per wife kept taking off his O2.  Still on 20L O2 HFNC.   Objective:   Vitals:   10/03/21 0757 10/03/21 1202 10/03/21 1342 10/03/21 1433  BP:   (!) 145/62   Pulse:   60   Resp:   (!) 22   Temp:   98.5 F (36.9 C)   TempSrc:   Oral   SpO2: 95% 92% 100% 97%  Weight:  Height:        Intake/Output Summary (Last 24 hours) at 10/03/2021 1449 Last data filed at 10/03/2021 1353 Gross per 24 hour  Intake 956 ml  Output 2450 ml  Net -1494 ml     Wt Readings from Last 3 Encounters:  10/03/21 93.4 kg  09/17/21 100.7 kg  09/09/21 101.2 kg    Physical Exam General: Alert and awake, NAD  Cardiovascular:  Respiratory: bibasilar crackles  Gastrointestinal: Soft, nontender, nondistended, NBS Ext: 1+ pedal edema bilaterally Skin: chronic venous stasis changes    Data Reviewed:  I have personally reviewed following labs and imaging studies  Micro Results Recent Results (from the past 240 hour(s))  Blood Culture (routine x 2)     Status: None   Collection Time: 09/27/21 12:20 AM   Specimen: BLOOD  Result Value Ref Range Status   Specimen Description BLOOD PORTA CATH  Final   Special Requests   Final    BOTTLES DRAWN AEROBIC AND ANAEROBIC Blood Culture adequate volume   Culture   Final    NO GROWTH 5 DAYS Performed at [facility], [address] [place], [place] [address]    Report Status 10/02/2021 FINAL  Final  Resp Panel by RT-PCR (Flu A&B, Covid) Peripheral     Status: None   Collection Time: 09/27/21 12:25 AM   Specimen: Peripheral; Nasopharyngeal(NP) swabs in vial transport medium  Result Value Ref Range Status   SARS Coronavirus 2 by RT PCR NEGATIVE NEGATIVE Final    Comment: (NOTE) SARS-CoV-2 target nucleic acids are NOT DETECTED.  The SARS-CoV-2 RNA is generally detectable in  upper respiratory specimens during the acute phase of infection. The lowest concentration of SARS-CoV-2 viral copies this assay can detect is 138 copies/mL. A negative result does not preclude SARS-Cov-2 infection and should not be used as the sole basis for treatment or other patient management decisions. A negative result may occur with  improper specimen collection/handling, submission of specimen other than nasopharyngeal swab, presence of viral mutation(s) within the areas targeted by this assay, and inadequate number of viral copies(<138 copies/mL). A negative result must be combined with clinical observations, patient history, and epidemiological information. The expected result is Negative.  Fact Sheet for Patients:  EntrepreneurPulse.com.au  Fact Sheet for Healthcare Providers:  IncredibleEmployment.be  This test is no t yet approved or cleared by the [place] FDA and  has been authorized for detection and/or diagnosis of SARS-CoV-2 by FDA under an Emergency Use Authorization (EUA). This EUA will remain  in effect (meaning this test can be used) for the duration of the COVID-19 declaration under Section 564(b)(1) of the Act, 21 U.S.C.section 360bbb-3(b)(1), unless the authorization is terminated  or revoked sooner.       Influenza A by PCR NEGATIVE NEGATIVE Final   Influenza B by PCR NEGATIVE NEGATIVE Final    Comment: (NOTE) The Xpert Xpress SARS-CoV-2/FLU/RSV plus assay is intended as an aid in the diagnosis of influenza from Nasopharyngeal swab specimens and should not be used as a sole basis for treatment. Nasal washings and aspirates are unacceptable for Xpert Xpress SARS-CoV-2/FLU/RSV testing.  Fact Sheet for Patients: EntrepreneurPulse.com.au  Fact Sheet for Healthcare Providers: IncredibleEmployment.be  This test is not yet approved or cleared by the [place] FDA and has been  authorized for detection and/or diagnosis of SARS-CoV-2 by FDA under an Emergency Use Authorization (EUA). This EUA will remain in effect (meaning this test can be used) for the duration of the COVID-19  declaration under Section 564(b)(1) of the Act, 21 U.S.C. section 360bbb-3(b)(1), unless the authorization is terminated or revoked.  Performed at [facility], [place] [address] [place], [place] [address]   Blood Culture (routine x 2)     Status: None   Collection Time: 09/27/21 12:38 AM   Specimen: BLOOD LEFT FOREARM  Result Value Ref Range Status   Specimen Description BLOOD LEFT FOREARM  Final   Special Requests   Final    BOTTLES DRAWN AEROBIC AND ANAEROBIC Blood Culture adequate volume   Culture   Final    NO GROWTH 5 DAYS Performed at [facility], [place] [address], [place], [place] [address]    Report Status 10/02/2021 FINAL  Final    Radiology Reports CT Abdomen Pelvis Wo Contrast  Result Date: 09/10/2021 CLINICAL DATA:  Primary Cancer Type: Lung Imaging Indication: Assess response to therapy Interval therapy since last imaging? Yes Initial Cancer Diagnosis Date: 08/06/2020; Established by: Biopsy-proven Detailed Pathology: Stage IV non-small cell lung cancer, mucinous adenocarcinoma. Primary Tumor location: Left upper lobe, right lower lobe. Surgeries: CABG 2016.  Cholecystectomy.  Multiple back surgeries. Chemotherapy: Yes; Ongoing?  No; Most recent administration: 01/2021 Immunotherapy?  Yes; Type: Keytruda; Ongoing? No Radiation therapy? No Other Cancer Therapies: Lumakras daily. EXAM: CT CHEST, ABDOMEN AND PELVIS WITHOUT CONTRAST TECHNIQUE: Multidetector CT imaging of the chest, abdomen and pelvis was performed following the standard protocol without IV contrast. COMPARISON:  Most recent CT chest, abdomen and pelvis 06/17/2021. 08/22/2020 PET-CT. FINDINGS: CT CHEST FINDINGS Cardiovascular: Right IJ Port-A-Cath terminates in the high right atrium. Atherosclerotic  calcification of the aorta and aortic valve. Heart is enlarged. No pericardial effusion. Mediastinum/Nodes: 1.4 cm low-attenuation right thyroid nodule. No follow-up recommended. (Ref: J Am Coll Radiol. 2015 Feb;12(2): 143-50).No pathologically enlarged mediastinal or axillary lymph nodes. Hilar regions are difficult to evaluate without IV contrast. Esophagus is grossly unremarkable. Lungs/Pleura: Post treatment parenchymal retraction, bronchiectasis and volume loss in the posteromedial mid and lower right hemithorax, as before. Small associated partially loculated right pleural effusion. Slight improvement in left upper lobe consolidation with associated architectural distortion. New peribronchovascular ground-glass in the left lower lobe (4/91), likely infectious or inflammatory in etiology. Subpleural reticular densities and ground-glass in the peripheral left lower lobe, similar. Trace left pleural fluid. Airway is unremarkable. Musculoskeletal: Degenerative changes in the spine. No worrisome lytic or sclerotic lesions. CT ABDOMEN PELVIS FINDINGS Hepatobiliary: Liver is unremarkable. Cholecystectomy. No biliary ductal dilatation. Pancreas: Negative. Spleen: Negative. Adrenals/Urinary Tract: Adrenal glands are unremarkable. 4.6 cm fluid density lesion off the lower pole right kidney is likely a cyst. Other smaller low-attenuation lesions in the kidneys are too small to characterize. There is a subtle 1.8 cm lesion the posterior left kidney (2/73), as on prior exams. No urinary stones. Ureters are decompressed. Bladder is grossly unremarkable. Stomach/Bowel: Stomach is unremarkable. Duodenal diverticula incidentally noted. Small bowel and colon unremarkable. Appendix is questionably visualized. Vascular/Lymphatic: Atherosclerotic calcification of the aorta. No pathologically enlarged lymph nodes. Reproductive: Prostate is visualized. Other: Slight haziness in the anterior left anatomic pelvis (2/100), nonspecific  and without a definite source. Mesenteries and peritoneum are otherwise unremarkable. Musculoskeletal: 6.6 x 9.9 cm partially calcified soft tissue mass posterior to the left sacroiliac joint and left sacrum, unchanged and possibly due to prior trauma. Degenerative changes in the spine. No worrisome lytic or sclerotic lesions. IMPRESSION: 1. Post treatment changes in the hemithoraces bilaterally. No evidence of recurrent or metastatic disease. 2. Subtle 1.8 cm lesion off the  posterior interpolar left kidney, as on prior exams. Renal cell carcinoma can not be excluded. 3. Small right pleural effusion. 4.  Aortic atherosclerosis (ICD10-I70.0). Electronically Signed   By: Lorin Picket M.D.   On: 09/10/2021 10:50   CT Chest Wo Contrast  Result Date: 09/10/2021 CLINICAL DATA:  Primary Cancer Type: Lung Imaging Indication: Assess response to therapy Interval therapy since last imaging? Yes Initial Cancer Diagnosis Date: 08/06/2020; Established by: Biopsy-proven Detailed Pathology: Stage IV non-small cell lung cancer, mucinous adenocarcinoma. Primary Tumor location: Left upper lobe, right lower lobe. Surgeries: CABG 2016.  Cholecystectomy.  Multiple back surgeries. Chemotherapy: Yes; Ongoing?  No; Most recent administration: 01/2021 Immunotherapy?  Yes; Type: Keytruda; Ongoing? No Radiation therapy? No Other Cancer Therapies: Lumakras daily. EXAM: CT CHEST, ABDOMEN AND PELVIS WITHOUT CONTRAST TECHNIQUE: Multidetector CT imaging of the chest, abdomen and pelvis was performed following the standard protocol without IV contrast. COMPARISON:  Most recent CT chest, abdomen and pelvis 06/17/2021. 08/22/2020 PET-CT. FINDINGS: CT CHEST FINDINGS Cardiovascular: Right IJ Port-A-Cath terminates in the high right atrium. Atherosclerotic calcification of the aorta and aortic valve. Heart is enlarged. No pericardial effusion. Mediastinum/Nodes: 1.4 cm low-attenuation right thyroid nodule. No follow-up recommended. (Ref: J Am Coll  Radiol. 2015 Feb;12(2): 143-50).No pathologically enlarged mediastinal or axillary lymph nodes. Hilar regions are difficult to evaluate without IV contrast. Esophagus is grossly unremarkable. Lungs/Pleura: Post treatment parenchymal retraction, bronchiectasis and volume loss in the posteromedial mid and lower right hemithorax, as before. Small associated partially loculated right pleural effusion. Slight improvement in left upper lobe consolidation with associated architectural distortion. New peribronchovascular ground-glass in the left lower lobe (4/91), likely infectious or inflammatory in etiology. Subpleural reticular densities and ground-glass in the peripheral left lower lobe, similar. Trace left pleural fluid. Airway is unremarkable. Musculoskeletal: Degenerative changes in the spine. No worrisome lytic or sclerotic lesions. CT ABDOMEN PELVIS FINDINGS Hepatobiliary: Liver is unremarkable. Cholecystectomy. No biliary ductal dilatation. Pancreas: Negative. Spleen: Negative. Adrenals/Urinary Tract: Adrenal glands are unremarkable. 4.6 cm fluid density lesion off the lower pole right kidney is likely a cyst. Other smaller low-attenuation lesions in the kidneys are too small to characterize. There is a subtle 1.8 cm lesion the posterior left kidney (2/73), as on prior exams. No urinary stones. Ureters are decompressed. Bladder is grossly unremarkable. Stomach/Bowel: Stomach is unremarkable. Duodenal diverticula incidentally noted. Small bowel and colon unremarkable. Appendix is questionably visualized. Vascular/Lymphatic: Atherosclerotic calcification of the aorta. No pathologically enlarged lymph nodes. Reproductive: Prostate is visualized. Other: Slight haziness in the anterior left anatomic pelvis (2/100), nonspecific and without a definite source. Mesenteries and peritoneum are otherwise unremarkable. Musculoskeletal: 6.6 x 9.9 cm partially calcified soft tissue mass posterior to the left sacroiliac joint and  left sacrum, unchanged and possibly due to prior trauma. Degenerative changes in the spine. No worrisome lytic or sclerotic lesions. IMPRESSION: 1. Post treatment changes in the hemithoraces bilaterally. No evidence of recurrent or metastatic disease. 2. Subtle 1.8 cm lesion off the posterior interpolar left kidney, as on prior exams. Renal cell carcinoma can not be excluded. 3. Small right pleural effusion. 4.  Aortic atherosclerosis (ICD10-I70.0). Electronically Signed   By: Lorin Picket M.D.   On: 09/10/2021 10:50   DG Chest Port 1 View  Result Date: 09/30/2021 CLINICAL DATA:  Dyspnea EXAM: PORTABLE CHEST 1 VIEW COMPARISON:  09/28/2021, 09/26/2021, CT 09/09/2001 FINDINGS: Right-sided central venous port tip over the cavoatrial region. Cardiomegaly with vascular congestion. Suspected bilateral pleural effusions. Similar appearance of bilateral ground-glass opacities and patchy consolidations.  No visible pneumothorax. IMPRESSION: Cardiomegaly with vascular congestion and suspected pleural effusions. Similar appearing bilateral ground-glass disease and patchy areas of consolidation Electronically Signed   By: Donavan Foil M.D.   On: 09/30/2021 16:32   DG CHEST PORT 1 VIEW  Result Date: 09/28/2021 CLINICAL DATA:  Dyspnea.  History of CHF EXAM: PORTABLE CHEST 1 VIEW COMPARISON:  09/28/2021 FINDINGS: Right-sided chest port remains in place. Cardiomegaly. Prior median sternotomy. Bilateral patchy airspace opacities persist, overall similar to the previous radiograph. Small right pleural effusion. No pneumothorax. IMPRESSION: Bilateral patchy airspace opacities persist, overall similar to previous radiograph. Electronically Signed   By: Davina Poke D.O.   On: 09/28/2021 15:28   DG CHEST PORT 1 VIEW  Result Date: 09/28/2021 CLINICAL DATA:  Shortness of breath EXAM: PORTABLE CHEST 1 VIEW COMPARISON:  Chest x-ray dated 09/26/2021 FINDINGS: Increased bilateral perihilar and bibasilar opacities, some  components suggesting dense consolidation and some components of ground-glass opacity. Probable small RIGHT pleural effusion. No pneumothorax is seen. Stable cardiomegaly. RIGHT chest wall Port-A-Cath is stable in position. Median sternotomy wires in place. IMPRESSION: 1. Increased bilateral perihilar and bibasilar opacities, including some components of ground-glass opacity, compatible with multifocal pneumonia versus pulmonary edema. 2. Probable small RIGHT pleural effusion. Electronically Signed   By: Franki Cabot M.D.   On: 09/28/2021 07:33   DG Chest Port 1 View  Result Date: 09/26/2021 CLINICAL DATA:  Respiratory distress. EXAM: PORTABLE CHEST 1 VIEW COMPARISON:  May 01, 2021 FINDINGS: There is stable right-sided venous Port-A-Cath positioning. Multiple sternal wires are seen. Mild, diffuse, chronic appearing increased interstitial lung markings are seen. Mild, stable areas of atelectasis are seen within the bilateral lung bases and mid left lung. Stable elevation of the right hemidiaphragm is seen. There is no evidence of a pleural effusion or pneumothorax. The cardiac silhouette is enlarged and unchanged in size. A radiopaque fusion plate and screws are seen overlying the cervical spine. IMPRESSION: Chronic appearing increased interstitial lung markings with mild, stable bibasilar and mid left lung atelectasis. Electronically Signed   By: Virgina Norfolk M.D.   On: 09/26/2021 23:11   ECHOCARDIOGRAM COMPLETE  Result Date: 09/29/2021    ECHOCARDIOGRAM REPORT   Patient Name:   Randall Wilkins Date of Exam: 09/29/2021 Medical Rec #:  [id number]      Height:       67.0 in Accession #:    [id number]     Weight:       214.9 lb Date of Birth:  1941-01-19      BSA:          2.085 m Patient Age:    59 years       BP:           120/58 mmHg Patient Gender: M              HR:           69 bpm. Exam Location:  Inpatient Procedure: 2D Echo, Color Doppler and Cardiac Doppler Indications:    A83.41 Acute systolic  (congestive) heart failure  History:        Patient has prior history of Echocardiogram examinations, most                 recent 05/06/2021. CHF, Prior CABG; Risk Factors:Hypertension,                 Diabetes and Dyslipidemia.  Sonographer:    Raquel Sarna Senior RDCS Referring Phys: [place]  1. Left ventricular ejection fraction, by estimation, is 45 to 50%. The left ventricle has mildly decreased function. The left ventricle demonstrates global hypokinesis. There is mild left ventricular hypertrophy. Left ventricular diastolic parameters are consistent with Grade III diastolic dysfunction (restrictive). There is the interventricular septum is flattened in systole and diastole, consistent with right ventricular pressure and volume overload.  2. Right ventricular systolic function is normal. The right ventricular size is normal. There is moderately elevated pulmonary artery systolic pressure. The estimated right ventricular systolic pressure is 84.6 mmHg.  3. The mitral valve is normal in structure. Mild mitral valve regurgitation. No evidence of mitral stenosis.  4. Tricuspid valve regurgitation is severe.  5. The aortic valve is calcified. There is severe calcifcation of the aortic valve. There is severe thickening of the aortic valve. Aortic valve regurgitation is trivial. Moderate aortic valve stenosis. Aortic valve mean gradient measures 21.0 mmHg. Aortic valve Vmax measures 3.53 m/s.  6. The inferior vena cava is normal in size with greater than 50% respiratory variability, suggesting right atrial pressure of 3 mmHg. Comparison(s): Prior mean aortic valve gradient 17.5 mmHg. FINDINGS  Left Ventricle: Left ventricular ejection fraction, by estimation, is 45 to 50%. The left ventricle has mildly decreased function. The left ventricle demonstrates global hypokinesis. The left ventricular internal cavity size was normal in size. There is  mild left ventricular hypertrophy. The interventricular septum  is flattened in systole and diastole, consistent with right ventricular pressure and volume overload. Left ventricular diastolic parameters are consistent with Grade III diastolic dysfunction (restrictive). Right Ventricle: The right ventricular size is normal. No increase in right ventricular wall thickness. Right ventricular systolic function is normal. There is moderately elevated pulmonary artery systolic pressure. The tricuspid regurgitant velocity is 3.28 m/s, and with an assumed right atrial pressure of 8 mmHg, the estimated right ventricular systolic pressure is 96.2 mmHg. Left Atrium: Left atrial size was normal in size. Right Atrium: Right atrial size was normal in size. Pericardium: There is no evidence of pericardial effusion. Mitral Valve: The mitral valve is normal in structure. Mild mitral valve regurgitation. No evidence of mitral valve stenosis. MV peak gradient, 8.4 mmHg. The mean mitral valve gradient is 3.0 mmHg. Tricuspid Valve: The tricuspid valve is normal in structure. Tricuspid valve regurgitation is severe. No evidence of tricuspid stenosis. Aortic Valve: The aortic valve is calcified. There is severe calcifcation of the aortic valve. There is severe thickening of the aortic valve. Aortic valve regurgitation is trivial. Moderate aortic stenosis is present. Aortic valve mean gradient measures  21.0 mmHg. Aortic valve peak gradient measures 49.8 mmHg. Aortic valve area, by VTI measures 0.70 cm. Pulmonic Valve: The pulmonic valve was normal in structure. Pulmonic valve regurgitation is not visualized. No evidence of pulmonic stenosis. Aorta: The aortic root is normal in size and structure. Venous: The inferior vena cava is normal in size with greater than 50% respiratory variability, suggesting right atrial pressure of 3 mmHg. IAS/Shunts: No atrial level shunt detected by color flow Doppler.  LEFT VENTRICLE PLAX 2D LVIDd:         4.60 cm   Diastology LVIDs:         3.70 cm   LV e' medial:     9.11 cm/s LV PW:         1.30 cm   LV E/e' medial:  14.3 LV IVS:        1.20 cm   LV e' lateral:   11.30 cm/s LVOT diam:  1.90 cm   LV E/e' lateral: 11.5 LV SV:         54 LV SV Index:   26 LVOT Area:     2.84 cm  RIGHT VENTRICLE RV S prime:     10.40 cm/s TAPSE (M-mode): 1.6 cm LEFT ATRIUM             Index        RIGHT ATRIUM           Index LA diam:        3.80 cm 1.82 cm/m   RA Area:     22.40 cm LA Vol (A2C):   62.4 ml 29.93 ml/m  RA Volume:   68.40 ml  32.80 ml/m LA Vol (A4C):   49.3 ml 23.64 ml/m LA Biplane Vol: 55.5 ml 26.62 ml/m  AORTIC VALVE AV Area (Vmax):    0.80 cm AV Area (Vmean):   1.06 cm AV Area (VTI):     0.70 cm AV Vmax:           353.00 cm/s AV Vmean:          198.000 cm/s AV VTI:            0.765 m AV Peak Grad:      49.8 mmHg AV Mean Grad:      21.0 mmHg LVOT Vmax:         99.80 cm/s LVOT Vmean:        73.900 cm/s LVOT VTI:          0.190 m LVOT/AV VTI ratio: 0.25  AORTA Ao Root diam: 3.10 cm Ao Asc diam:  3.10 cm MITRAL VALVE                TRICUSPID VALVE MV Area (PHT): 5.02 cm     TR Peak grad:   43.0 mmHg MV Area VTI:   1.78 cm     TR Vmax:        328.00 cm/s MV Peak grad:  8.4 mmHg MV Mean grad:  3.0 mmHg     SHUNTS MV Vmax:       1.45 m/s     Systemic VTI:  0.19 m MV Vmean:      87.3 cm/s    Systemic Diam: 1.90 cm MV Decel Time: 151 msec MV E velocity: 130.00 cm/s MV A velocity: 94.90 cm/s MV E/A ratio:  1.37 Candee Furbish MD Electronically signed by Candee Furbish MD Signature Date/Time: 09/29/2021/5:46:56 PM    Final     Lab Data:  CBC: Recent Labs  Lab 09/27/21 0020 09/27/21 0407 09/28/21 0449 09/29/21 0622 09/30/21 0515 10/01/21 0529  WBC 8.1 6.9 8.2 9.8 7.2  --   NEUTROABS 6.3 4.7  --   --   --   --   HGB 8.7* 8.8* 9.8* 9.7* 8.9* 8.8*  HCT 29.4* 28.8* 32.4* 31.6* 29.9* 29.6*  MCV 96.4 95.0 94.5 93.5 95.2  --   PLT 121* 96* 102* 110* 110*  --    Basic Metabolic Panel: Recent Labs  Lab 09/27/21 0407 09/28/21 0449 09/28/21 0930 09/29/21 0703  09/30/21 0515 10/01/21 0529  NA 137 138 139 136 139 133*  K 4.3 3.7 3.6 3.7 3.7 4.1  CL 105 103 102 102 104 101  CO2 24 25 26 24 26 23   GLUCOSE 169* 105* 199* 141* 142* 223*  BUN 38* 35* 35* 36* 41* 48*  CREATININE 2.27* 1.99* 1.86* 1.76* 1.78* 1.93*  CALCIUM 8.4* 8.5* 8.5* 8.6*  8.6* 8.2*  MG 2.2 2.0  --  2.3 2.0 2.2  PHOS  --  4.3  --  4.2 3.7 4.5   GFR: Estimated Creatinine Clearance: 33.2 mL/min (A) (by C-G formula based on SCr of 1.93 mg/dL (H)). Liver Function Tests: Recent Labs  Lab 09/27/21 0020 09/27/21 0407 09/28/21 0449 09/29/21 0703 09/30/21 0515 10/01/21 0529  AST 17 15  --   --   --   --   ALT 11 8  --   --   --   --   ALKPHOS 92 89  --   --   --   --   BILITOT 0.9 0.7  --   --   --   --   PROT 5.8* 5.6*  --   --   --   --   ALBUMIN 2.8* 2.6* 2.7* 2.6* 2.2* 2.2*   No results for input(s): LIPASE, AMYLASE in the last 168 hours. No results for input(s): AMMONIA in the last 168 hours. Coagulation Profile: Recent Labs  Lab 09/27/21 0020  INR 1.8*   Cardiac Enzymes: Recent Labs  Lab 09/28/21 0449  CKTOTAL 38*   BNP (last 3 results) No results for input(s): PROBNP in the last 8760 hours. HbA1C: No results for input(s): HGBA1C in the last 72 hours. CBG: Recent Labs  Lab 10/02/21 1148 10/02/21 1639 10/02/21 2112 10/03/21 0540 10/03/21 1221  GLUCAP 204* 181* 197* 170* 237*   Lipid Profile: No results for input(s): CHOL, HDL, LDLCALC, TRIG, CHOLHDL, LDLDIRECT in the last 72 hours. Thyroid Function Tests: No results for input(s): TSH, T4TOTAL, FREET4, T3FREE, THYROIDAB in the last 72 hours. Anemia Panel: No results for input(s): VITAMINB12, FOLATE, FERRITIN, TIBC, IRON, RETICCTPCT in the last 72 hours. Urine analysis:    Component Value Date/Time   COLORURINE YELLOW 09/27/2021 0021   APPEARANCEUR CLEAR 09/27/2021 0021   LABSPEC 1.011 09/27/2021 0021   PHURINE 5.0 09/27/2021 0021   GLUCOSEU NEGATIVE 09/27/2021 0021   GLUCOSEU NEGATIVE  08/29/2019 0959   HGBUR NEGATIVE 09/27/2021 0021   BILIRUBINUR NEGATIVE 09/27/2021 0021   KETONESUR NEGATIVE 09/27/2021 0021   PROTEINUR 30 (A) 09/27/2021 0021   UROBILINOGEN 0.2 08/29/2019 0959   NITRITE NEGATIVE 09/27/2021 0021   LEUKOCYTESUR NEGATIVE 09/27/2021 0021     Dolores Mcgovern M.D. [facility] 10/03/2021, 2:49 PM  Available via Epic secure chat 7am-7pm After 7 pm, please refer to night coverage provider listed on amion.

## 2021-10-03 NOTE — Progress Notes (Signed)
Inpatient Diabetes Program Recommendations  AACE/ADA: New Consensus Statement on Inpatient Glycemic Control (2015)  Target Ranges:  Prepandial:   less than 140 mg/dL      Peak postprandial:   less than 180 mg/dL (1-2 hours)      Critically ill patients:  140 - 180 mg/dL  Results for Randall Wilkins, Randall Wilkins (MRN [id number]) as of 10/03/2021 12:48  Ref. Range 10/02/2021 06:14 10/02/2021 08:39 10/02/2021 11:48 10/02/2021 16:39 10/02/2021 21:12  Glucose-Capillary Latest Ref Range: 70 - 99 mg/dL 178 (H) 241 (H)  5 units Novolog  204 (H)  5 units Novolog  181 (H)  3 units Novolog  197 (H)  Results for Randall Wilkins, Randall Wilkins (MRN [id number]) as of 10/03/2021 12:48  Ref. Range 10/03/2021 05:40 10/03/2021 12:21  Glucose-Capillary Latest Ref Range: 70 - 99 mg/dL 170 (H)  3 units Novolog  237 (H)    Home DM Meds: Tresiba 6 units QHS        Humalog 5-15 units TID per SSI  Current Orders: Novolog Moderate Correction Scale/ SSI (0-15 units) TID AC + HS    MD- Per documentation, pt eating 100% of meals  Afternoon CBGs elevated  Please consider starting Novolog Meal Coverage:  Novolog 3 units TID with meals  Hold if pt eats <50% of meal, Hold if pt NPO    --Will follow patient during hospitalization--  Wyn Quaker RN, MSN, CDE Diabetes Coordinator Inpatient Glycemic Control Team Team Pager: [phone] (8a-5p)

## 2021-10-03 NOTE — Progress Notes (Signed)
   HF Navigation Team   Screened for HF [facility] however now planning for Hospice at discharge.   Hospice will manage symptoms.   Daishia Fetterly NP-C  2:12 PM

## 2021-10-03 NOTE — Progress Notes (Signed)
 Palliative Medicine Inpatient Follow Up Note  Consulting Provider: Gonfa, Taye T, MD   Reason for consult:   Palliative Care Consult Services Palliative Medicine Consult  Reason for Consult? Goal of care counseling      HPI:  Per intake H&P --> 80-year-old M with PMH of combined CHF, stage IV lung cancer, DM-1, chronic hypoxic RF on 2 L, A. fib/flutter on Eliquis, CAD, obesity, hard of hearing, recent "UTI" and ambulatory dysfunction presenting with progressive weakness and acute shortness of breath.  He is admitted with acute on chronic hypoxic RF due to CHF exacerbation.  He was hypoxic to 52% on RA requiring up to 6 L to recover.  BNP elevated to 1200.  CXR with pulmonary edema.    Palliative care was asked to get involved to discuss goals of care in the setting of multiple comorbid conditions.  Today's Discussion (10/03/2021):  *Please note that this is a verbal dictation therefore any spelling or grammatical errors are due to the "Dragon Medical One" system interpretation.  Chart reviewed.   I met with Randall Wilkins and his wife, Randall Wilkins at bedside.  I shared with him per my chart review Randall Wilkins has been doing poorly throughout the duration of the week certainly since I last saw him on Monday.  Randall Wilkins shares that things have not gone the way she had hoped they would.  We reviewed Randall Wilkins's very poor clinical state in the setting of his heart failure and stage IV lung cancer.  We discussed that he is declining in front of her very eyes and likely he will not recover from this.  Patient's wife Randall Wilkins asked that we reach out to Drs. Mohammed as well as patient's primary pulmonologist Dr. Byrum to inform them of his declining health state.  I asked Randall Wilkins where he would like to be in his final phases of life.  He shares with me that he would want to be home and his wife Randall Wilkins also endorses this.  Randall Wilkins is very tearful during our time together as she was so hopeful he would improve during this  hospitalization.  I offered her support through empathetic listening.  We reviewed that Randall Wilkins would like to take Randall Wilkins to his home where he may passes peacefully as possible.  We discussed for the time being to continue present measures while she sets the home up for him to transition there.  We reviewed what hospice in the home would look like.  Questions and concerns were answered and palliative support was provided.  Objective Assessment: Vital Signs Vitals:   10/03/21 1202 10/03/21 1342  BP:  (!) 145/62  Pulse:  60  Resp:  (!) 22  Temp:  98.5 F (36.9 C)  SpO2: 92% 100%    Intake/Output Summary (Last 24 hours) at 10/03/2021 1404 Last data filed at 10/03/2021 1353 Gross per 24 hour  Intake 956 ml  Output 2450 ml  Net -1494 ml    Last Weight  Most recent update: 10/03/2021  4:55 AM    Weight  93.4 kg (205 lb 14.6 oz)            Gen: Frail elderly male in mild distress HEENT: moist mucous membranes CV: Regular rate and irregular rhythm PULM: On 20L HFNC with diffuse rhonchi and crackles bilaterally ABD: soft/nontender EXT: (+) edema Neuro: Alert and oriented x2-3 intermittently forgetful  SUMMARY OF RECOMMENDATIONS   DNAR/DNI   MOST / DNR Form Completed   A copy of advance directives has been   obtained  Continue current level of care in anticipation of patient transitioning home with hospice   TOC -transition home with hospice care   Ongoing incremental   Time Spent: 60 Greater than 50% of the time was spent in counseling and coordination of care ______________________________________________________________________________________   [facility] Palliative Medicine Team Team Cell Phone: [phone] Please utilize secure chat with additional questions, if there is no response within 30 minutes please call the above phone number  Palliative Medicine Team providers are available by phone from 7am to 7pm daily and can be reached through the  team cell phone.  Should this patient require assistance outside of these hours, please call the patient's attending physician.     

## 2021-10-03 NOTE — Progress Notes (Signed)
Manufacturing engineer [facility]) Hospital Liaison note.    Received request from [place] for family interest in [facility]. Chart and pt information under review by [facility] physician.  Hospice eligibility pending at this time.  Based on discussion with care team decision made to delay reaching out to pt's spouse until tomorrow.  [place] liaison team will reach out tomorrow.  Thank you for the opportunity to participate in this patient's care.  Domenic Moras, BSN, RN [facility] Liaison (listed on [place] under Hospice/[facility])    [phone] (24h on call)

## 2021-10-03 NOTE — Progress Notes (Signed)
NAME:  Randall Wilkins, MRN:  [id number], DOB:  1941-09-02, LOS: 6 ADMISSION DATE:  09/26/2021, CONSULTATION DATE:  10/18 REFERRING MD:  Cyndia Skeeters, CHIEF COMPLAINT:  respiratory failure    History of Present Illness:  This is a 80 year old male patient with a complicated medical history as mentioned below who was admitted on 10/15 with chief complaint of weakness and shortness of breath.  Which had had progressed from initial weakness to about 1 day history of worsening shortness of breath.  He had been seen in the outpatient setting and treated for a urinary tract infection initially felt possibly being the etiology of his weakness however as his symptoms got worse he got to the point where he could no longer walk, and his wife had to push him around in a wheelchair.  On the day of admission his spouse noticed he was working hard to breathe, he was on his typical 2 L of oxygen.  EMS was called, on arrival to emergency room he required up to 6 L to maintain saturations greater than 88% in room air saturations were 52%.  His BNP was 1183, initial chest x-ray demonstrated pulmonary edema he was admitted to the internal medicine service with a working diagnosis of acute combined systolic and diastolic heart failure with a resultant pulmonary edema and respiratory failure.  Therapeutic interventions included: IV Lasix, noninvasive positive pressure ventilation and hospital admission. Hospital course: 10/16 cardiology and palliative care consulted.  Did have increased oxygen requirements and worsening chest x-ray, also spiked fever of 102.3 and therefore empiric cefepime and Flagyl added.  DNR status established 10/18 had been doing a little bit better.  No major issues overnight.  Weaned down to 13 L high flow oxygen but still having increased work of breathing.  Later in the afternoon on 10/18 had acute worsening of respiratory failure with saturations down to 70, initially placed on high flow however remained  quite short of breath and therefore placed on BiPAP.  Pulmonary medicine asked to evaluate to see if anything else could potentially be added to his care  Pertinent  Medical History  Combined heart failure, stage IV lung cancer, chronic hypoxic respiratory failure typically on 2 L, pulmonary artery hypertension, atrial fibrillation/flutter on Eliquis, coronary artery disease, diabetes type 1, obesity, generalized weakness.Obstructive sleep apnea on CPAP.  Coronary artery disease and prior CABG  Significant Hospital Events: Including procedures, antibiotic start and stop dates in addition to other pertinent events   10/16 cardiology and palliative care consulted.  Did have increased oxygen requirements and worsening chest x-ray, also spiked fever of 102.3 and therefore empiric cefepime and Flagyl added.  DNR status established 10/18 had been doing a little bit better.  No major issues overnight.  Weaned down to 13 L high flow oxygen but still having increased work of breathing.  Later in the afternoon on 10/18 had acute worsening of respiratory failure with saturations down to 70, initially placed on high flow however remained quite short of breath and therefore placed on BiPAP.  Pulmonary medicine asked to evaluate to see if anything else could potentially be added to his care  Interim History / Subjective:  Planning for hospice  Objective   Blood pressure (!) 145/62, pulse 60, temperature 98.5 F (36.9 C), temperature source Oral, resp. rate (!) 22, height 5\' 7"  (1.702 m), weight 93.4 kg, SpO2 97 %.    FiO2 (%):  [40 %-60 %] 60 %   Intake/Output Summary (Last 24 hours) at 10/03/2021 1806  Last data filed at 10/03/2021 1353 Gross per 24 hour  Intake 720 ml  Output 1500 ml  Net -780 ml    Filed Weights   10/01/21 0258 10/02/21 0414 10/03/21 0454  Weight: 95 kg 96.3 kg 93.4 kg    Examination: General: chronically ill appearing man lying in bed watching TV in NAD HENT: [place]/AT, eyes  anicteric Lungs: no accessory muscle use, able to speak without dysnpea, minor rhales on the R Cardiovascular: s1S2, RRR Abdomen: soft, NT, ND Extremities: mild edema, stasis dermatitis left shin Neuro: awake, moving all extremities, globally weak GU: clear yellow urine  Previous CXR and Cts personally reviewed BNP 723  Resolved Hospital Problem list     Assessment & Plan:   Chronic hypoxic respiratory failure Stage IV lung cancer; adenocarcinoma Acute systolic and diastolic heart failure Aortic stenosis Acute pulmonary edema Pulmonary hypertension Chronic atrial fibrillation/atrial flutter on Eliquis Obesity with history of obstructive sleep apnea CKD stage IV Anemia of chronic disease Mild thrombocytopenia Hypothyroidis History of coronary artery disease with prior CABG and PTCA Hyperglycemia   Acute on Chronic hypoxic respiratory failure, multifactorial: Pulmonary edema 2/2 acute systolic/diastolic HF, w/ severe AS, +/- PNA superimposed on h/o lung cancer.  Pleural effusion on the R- chronic since July at least -con't diuresis -agree with Bds -HHFNC; agree that he cannot go home with hospice on this degree of oxygen. If able to wean O2 then could revisit this -doing ok without steroids; can resume if wheezing  Sundowning, acute encephalopathy overnight due to disorientation -start seroquel QHS; d/w wife  Please call PCCM pager over the weekend if questions arise.   Best Practice (right click and "Reselect all SmartList Selections" daily)   Per primary Labs   CBC: Recent Labs  Lab 09/27/21 0020 09/27/21 0407 09/28/21 0449 09/29/21 0622 09/30/21 0515 10/01/21 0529  WBC 8.1 6.9 8.2 9.8 7.2  --   NEUTROABS 6.3 4.7  --   --   --   --   HGB 8.7* 8.8* 9.8* 9.7* 8.9* 8.8*  HCT 29.4* 28.8* 32.4* 31.6* 29.9* 29.6*  MCV 96.4 95.0 94.5 93.5 95.2  --   PLT 121* 96* 102* 110* 110*  --      Basic Metabolic Panel: Recent Labs  Lab 09/27/21 0407 09/28/21 0449  09/28/21 0930 09/29/21 0703 09/30/21 0515 10/01/21 0529  NA 137 138 139 136 139 133*  K 4.3 3.7 3.6 3.7 3.7 4.1  CL 105 103 102 102 104 101  CO2 24 25 26 24 26 23   GLUCOSE 169* 105* 199* 141* 142* 223*  BUN 38* 35* 35* 36* 41* 48*  CREATININE 2.27* 1.99* 1.86* 1.76* 1.78* 1.93*  CALCIUM 8.4* 8.5* 8.5* 8.6* 8.6* 8.2*  MG 2.2 2.0  --  2.3 2.0 2.2  PHOS  --  4.3  --  4.2 3.7 4.5    GFR: Estimated Creatinine Clearance: 33.2 mL/min (A) (by C-G formula based on SCr of 1.93 mg/dL (H)). Recent Labs  Lab 09/27/21 0021 09/27/21 0053 09/27/21 0407 09/28/21 0449 09/29/21 0622 09/30/21 0515  WBC  --   --  6.9 8.2 9.8 7.2  LATICACIDVEN 1.0 1.3  --   --   --   --      Liver Function Tests: Recent Labs  Lab 09/27/21 0020 09/27/21 0407 09/28/21 0449 09/29/21 0703 09/30/21 0515 10/01/21 0529  AST 17 15  --   --   --   --   ALT 11 8  --   --   --   --  ALKPHOS 92 89  --   --   --   --   BILITOT 0.9 0.7  --   --   --   --   PROT 5.8* 5.6*  --   --   --   --   ALBUMIN 2.8* 2.6* 2.7* 2.6* 2.2* 2.2*    No results for input(s): LIPASE, AMYLASE in the last 168 hours. No results for input(s): AMMONIA in the last 168 hours.  ABG    Component Value Date/Time   PHART 7.446 09/26/2021 2354   PCO2ART 37.5 09/26/2021 2354   PO2ART 90 09/26/2021 2354   HCO3 25.2 09/30/2021 1715   TCO2 27 09/26/2021 2354   ACIDBASEDEF 3.0 (H) 03/30/2015 0110   O2SAT 91.8 09/30/2021 [place] Keats Kingry, DO 10/03/21 6:06 PM [facility]

## 2021-10-03 NOTE — TOC Progression Note (Addendum)
Transition of Care [facility]) - Progression Note    Patient Details  Name: Randall Wilkins MRN: [id number] Date of Birth: 09-10-1941  Transition of Care [facility] CM/SW Contact  Zenon Mayo, RN Phone Number: 10/03/2021, 4:01 PM  Clinical Narrative:    NCM spoke with wife to see if she has made a decision regarding home with hospice or residential hospice.  She states she has made a decision and would like for him to go to [facility],  this NCM informed MD and Chrislyn with [facility].  MD said they will talk to family tomorrow, since this is a lot on them right now.  Chrislyn with [facility] will have someone to reach out to family tomorrow.        Expected Discharge Plan and Services                                                 Social Determinants of Health (SDOH) Interventions    Readmission Risk Interventions Readmission Risk Prevention Plan 03/03/2021  PCP or Specialist Appt within 3-5 Days Complete  HRI or [place] Complete  Social Work Consult for [place] Planning/Counseling Complete  Palliative Care Screening Not Applicable  Medication Review Press photographer) Complete  Some recent data might be hidden

## 2021-10-03 NOTE — Progress Notes (Addendum)
Progress Note  Patient Name: Randall Wilkins Date of Encounter: 10/03/2021  [facility] Cardiologist: Pixie Casino, MD   Subjective   Feels his breathing is stable despite dip in O2 sats today. No complaints of chest pain or palpitations. Wife is at bedside. They just met with palliative care and plan to transition to what sounds like hospice care. They are both tearful today. They have been married for 43 years and have 3 children who all live locally. Wife wishes to care for him at home.   Inpatient Medications    Scheduled Meds:  allopurinol  100 mg Oral Daily   amiodarone  200 mg Oral BID   apixaban  2.5 mg Oral BID   arformoterol  15 mcg Nebulization BID   budesonide (PULMICORT) nebulizer solution  0.5 mg Nebulization BID   Chlorhexidine Gluconate Cloth  6 each Topical Daily   furosemide  20 mg Oral Daily   insulin aspart  0-15 Units Subcutaneous TID WC   insulin aspart  0-5 Units Subcutaneous QHS   levothyroxine  88 mcg Oral Q0600   metroNIDAZOLE  500 mg Oral Q12H   rosuvastatin  20 mg Oral Daily   sodium chloride flush  10 mL Intravenous Q12H   sodium chloride flush  10-40 mL Intracatheter Q12H   sotorasib  960 mg Oral Daily   tamsulosin  0.4 mg Oral QHS   umeclidinium bromide  1 puff Inhalation Daily   Continuous Infusions:  ceFEPime (MAXIPIME) IV 2 g (10/03/21 0856)   PRN Meds: acetaminophen **OR** acetaminophen, ipratropium-albuterol, sodium chloride flush, sodium chloride flush   Vital Signs    Vitals:   10/02/21 2025 10/03/21 0259 10/03/21 0454 10/03/21 0757  BP:   (!) 130/51   Pulse:   76   Resp:   20   Temp:   98.4 F (36.9 C)   TempSrc:   Oral   SpO2: 94% 93% 90% 95%  Weight:   93.4 kg   Height:        Intake/Output Summary (Last 24 hours) at 10/03/2021 0957 Last data filed at 10/03/2021 0501 Gross per 24 hour  Intake 830 ml  Output 2050 ml  Net -1220 ml   Last 3 Weights 10/03/2021 10/02/2021 10/01/2021  Weight (lbs) 205 lb 14.6 oz  212 lb 4.9 oz 209 lb 7 oz  Weight (kg) 93.4 kg 96.3 kg 95 kg      Telemetry    Appears to be in sinus rhythm with 1st degree AV block with occasional PVCs - Personally Reviewed  ECG    No new tracings - Personally Reviewed  Physical Exam   GEN: Chronically ill appearing gentleman sitting upright in bed in no acute distress.   Neck: No JVD Cardiac: RRR, + murmur, no rubs or gallops.  Respiratory: crackles at lung bases with scattered expiratory wheeze GI: Soft, nontender, non-distended  MS: No edema; No deformity. Neuro:  Nonfocal  Psych: tearful  Labs    High Sensitivity Troponin:   Recent Labs  Lab 09/27/21 0020 09/27/21 0156 09/28/21 1658 09/30/21 1715 09/30/21 1837  TROPONINIHS 76* 83* 68* 60* 77*     Chemistry Recent Labs  Lab 09/27/21 0020 09/27/21 0407 09/28/21 0449 09/29/21 0703 09/30/21 0515 10/01/21 0529  NA 137 137   < > 136 139 133*  K 4.2 4.3   < > 3.7 3.7 4.1  CL 103 105   < > 102 104 101  CO2 23 24   < > 24  26 23  GLUCOSE 145* 169*   < > 141* 142* 223*  BUN 39* 38*   < > 36* 41* 48*  CREATININE 2.22* 2.27*   < > 1.76* 1.78* 1.93*  CALCIUM 8.4* 8.4*   < > 8.6* 8.6* 8.2*  MG  --  2.2   < > 2.3 2.0 2.2  PROT 5.8* 5.6*  --   --   --   --   ALBUMIN 2.8* 2.6*   < > 2.6* 2.2* 2.2*  AST 17 15  --   --   --   --   ALT 11 8  --   --   --   --   ALKPHOS 92 89  --   --   --   --   BILITOT 0.9 0.7  --   --   --   --   GFRNONAA 29* 28*   < > 39* 38* 35*  ANIONGAP 11 8   < > '10 9 9   ' < > = values in this interval not displayed.    Lipids No results for input(s): CHOL, TRIG, HDL, LABVLDL, LDLCALC, CHOLHDL in the last 168 hours.  Hematology Recent Labs  Lab 09/28/21 0449 09/29/21 0622 09/30/21 0515 10/01/21 0529  WBC 8.2 9.8 7.2  --   RBC 3.43* 3.38* 3.14*  --   HGB 9.8* 9.7* 8.9* 8.8*  HCT 32.4* 31.6* 29.9* 29.6*  MCV 94.5 93.5 95.2  --   MCH 28.6 28.7 28.3  --   MCHC 30.2 30.7 29.8*  --   RDW 17.3* 17.2* 17.2*  --   PLT 102* 110* 110*  --     Thyroid No results for input(s): TSH, FREET4 in the last 168 hours.  BNP Recent Labs  Lab 09/27/21 0020 09/28/21 0449 09/30/21 0515  BNP 1,183.2* 1,361.1* 723.3*    DDimer No results for input(s): DDIMER in the last 168 hours.   Radiology    No results found.  Cardiac Studies   Echocardiogram 09/29/21: 1. Left ventricular ejection fraction, by estimation, is 45 to 50%. The  left ventricle has mildly decreased function. The left ventricle  demonstrates global hypokinesis. There is mild left ventricular  hypertrophy. Left ventricular diastolic parameters  are consistent with Grade III diastolic dysfunction (restrictive). There  is the interventricular septum is flattened in systole and diastole,  consistent with right ventricular pressure and volume overload.   2. Right ventricular systolic function is normal. The right ventricular  size is normal. There is moderately elevated pulmonary artery systolic  pressure. The estimated right ventricular systolic pressure is 92.0 mmHg.   3. The mitral valve is normal in structure. Mild mitral valve  regurgitation. No evidence of mitral stenosis.   4. Tricuspid valve regurgitation is severe.   5. The aortic valve is calcified. There is severe calcifcation of the  aortic valve. There is severe thickening of the aortic valve. Aortic valve  regurgitation is trivial. Moderate aortic valve stenosis. Aortic valve  mean gradient measures 21.0 mmHg.  Aortic valve Vmax measures 3.53 m/s.   6. The inferior vena cava is normal in size with greater than 50%  respiratory variability, suggesting right atrial pressure of 3 mmHg.   Comparison(s): Prior mean aortic valve gradient 17.5 mmHg.   Patient Profile     79 y.o. male with a PMH of CAD s/p PCI/DES to RCA in 2005, chronic combined CHF, persistent atrial fibrillation/flutter, HTN, DM type 1, CVA, chronic respiratory failure on O2 2-3L with  lung cancer stage IV, CKD stage 3, who is being  followed by cardiology for the evaluation of CHF/Afib.   Assessment & Plan    1. Acute on chronic respiratory failure: patient presented with worsening SOB and hypoxia. Echo this admission with EF 45-50%, G3DD, mild MR, moderate AS. Felt to be multifactorial in the setting of CHF, AS, chronic respiratory failure requiring home O2, lung cancer, and possible PNA vs lymphangitic spread. Seen by [facility] 09/30/21 and recommended for ongoing supportive care and palliative care approach. He is on po lasix 76m daily, previously IV lasix 645m80mg daily, with UOP net -1.1L in the past 24 hours and -6.6L this admission. Weight is up to 212lbs today from 209lbs yesterday. Cr up to 1.9 yesterday from 1.7, though down from 2.2 on admission (baseline appears to be 1.4-1.5).  - Continue po lasix 2069maily - Continue antibiotics and supportive care per primary team - Wean O2 via HFNC as tolerated.  - Continue outpatient follow-up with oncology for management of his lung cancer. On lumakras - Await palliative care recommendations, though suspect he will be transitioning to hospice care based on our conversation today.   2. Persistent atrial fibrillation/flutter: initially planned for DCCV 10/01/21, though this was canceled given ongoing increased O2 via HFNC demands. On my review of telemetry, I believe he has been maintaining sinus rhythm with 1st degree AV block with occasional PVCs.  - Continue amiodarone for rhythm control - Continue eliquis for stroke ppx   3. Aortic stenosis: moderate on echo this admission.  - Avoid dehydration - Continue outpatient monitoring if he makes a meaningful recovery and this is in line with his GOC.   4. AoCKD stage 3a: Cr 1.4-1.5 at baseline. Elevated to 2.2 on arrival, down to 1.7 09/30/21, now up to 1.9 today.  - Continue to monitor closely with diuresis.       He has upcoming appointments with Afib clinic and Dr. CamCurt Bears2023.   For questions or updates, please contact  [facility] consult [email or web address] for contact info under        Signed, KriAbigail ButtsA-C  10/03/2021, 9:57 AM    Patient examined chart reviewed discussed care with primary and wife He has had excellent diuresis over a liter every day he is not volume overloaded now He continues to require high flow oxygen with low sats in setting of stage 4 lung cancer and pneumonia He has moderate AS that is not fixable Cr is stable with diuresis No indication for inotropes given good diuresis and stable renal function Would consult pulmonary given his chronic oxygen use and consider palliative care consult  PetJenkins Rouge FACMammoth Hospital

## 2021-10-04 DIAGNOSIS — J9601 Acute respiratory failure with hypoxia: Secondary | ICD-10-CM | POA: Diagnosis not present

## 2021-10-04 DIAGNOSIS — Z515 Encounter for palliative care: Secondary | ICD-10-CM | POA: Diagnosis not present

## 2021-10-04 DIAGNOSIS — I5043 Acute on chronic combined systolic (congestive) and diastolic (congestive) heart failure: Secondary | ICD-10-CM | POA: Diagnosis not present

## 2021-10-04 DIAGNOSIS — C3491 Malignant neoplasm of unspecified part of right bronchus or lung: Secondary | ICD-10-CM | POA: Diagnosis not present

## 2021-10-04 DIAGNOSIS — Z7189 Other specified counseling: Secondary | ICD-10-CM | POA: Diagnosis not present

## 2021-10-04 LAB — BASIC METABOLIC PANEL
Anion gap: 8 (ref 5–15)
BUN: 31 mg/dL — ABNORMAL HIGH (ref 8–23)
CO2: 27 mmol/L (ref 22–32)
Calcium: 8.4 mg/dL — ABNORMAL LOW (ref 8.9–10.3)
Chloride: 106 mmol/L (ref 98–111)
Creatinine, Ser: 1.43 mg/dL — ABNORMAL HIGH (ref 0.61–1.24)
GFR, Estimated: 50 mL/min — ABNORMAL LOW (ref >60)
Glucose, Bld: 155 mg/dL — ABNORMAL HIGH (ref 70–99)
Potassium: 3.3 mmol/L — ABNORMAL LOW (ref 3.5–5.1)
Sodium: 141 mmol/L (ref 135–145)

## 2021-10-04 LAB — GLUCOSE, CAPILLARY: Glucose-Capillary: 152 mg/dL — ABNORMAL HIGH (ref 70–99)

## 2021-10-04 MED ORDER — GLYCOPYRROLATE 0.2 MG/ML IJ SOLN: 0.2000 mg | INTRAMUSCULAR | Status: DC | PRN

## 2021-10-04 MED ORDER — HALOPERIDOL 0.5 MG PO TABS
0.5000 mg | ORAL_TABLET | ORAL | Status: DC | PRN
  Filled 2021-10-04: qty 1

## 2021-10-04 MED ORDER — ONDANSETRON HCL 4 MG/2ML IJ SOLN: 4.0000 mg | Freq: Four times a day (QID) | INTRAMUSCULAR | Status: DC | PRN

## 2021-10-04 MED ORDER — HYDROMORPHONE HCL 1 MG/ML IJ SOLN
0.5000 mg | Freq: Two times a day (BID) | INTRAMUSCULAR | Status: DC
  Administered 2021-10-04 – 2021-10-06 (×6): 0.5 mg via INTRAVENOUS
  Filled 2021-10-04 (×6): qty 0.5

## 2021-10-04 MED ORDER — BIOTENE DRY MOUTH MT LIQD: 15.0000 mL | OROMUCOSAL | Status: DC | PRN

## 2021-10-04 MED ORDER — GLYCOPYRROLATE 1 MG PO TABS: 1.0000 mg | ORAL_TABLET | ORAL | Status: DC | PRN

## 2021-10-04 MED ORDER — POLYVINYL ALCOHOL 1.4 % OP SOLN: 1.0000 [drp] | Freq: Four times a day (QID) | OPHTHALMIC | Status: DC | PRN

## 2021-10-04 MED ORDER — BISACODYL 10 MG RE SUPP: 10.0000 mg | Freq: Every day | RECTAL | Status: DC | PRN

## 2021-10-04 MED ORDER — HYDROMORPHONE HCL 1 MG/ML IJ SOLN: 0.5000 mg | INTRAMUSCULAR | Status: DC | PRN

## 2021-10-04 MED ORDER — LORAZEPAM 2 MG/ML IJ SOLN: 1.0000 mg | INTRAMUSCULAR | Status: DC | PRN

## 2021-10-04 MED ORDER — HALOPERIDOL LACTATE 2 MG/ML PO CONC: 0.5000 mg | ORAL | Status: DC | PRN

## 2021-10-04 MED ORDER — ONDANSETRON 4 MG PO TBDP: 4.0000 mg | ORAL_TABLET | Freq: Four times a day (QID) | ORAL | Status: DC | PRN

## 2021-10-04 MED ORDER — HALOPERIDOL LACTATE 5 MG/ML IJ SOLN: 0.5000 mg | INTRAMUSCULAR | Status: DC | PRN

## 2021-10-04 NOTE — Progress Notes (Signed)
[place] [phone] Manufacturing engineer [facility]) Hospice hospital liaison note  Received request from [facility] for family interest in [facility]. Chart reviewed and [facility] eligibility is pending at this time. Met with patient and his wife in the room to acknowledge referral and explain services.  Unfortunately [facility] does not have a bed to offer today. TOC is aware hospital liaison will follow up tomorrow or sooner if room becomes available.   Please do not hesitate to call with any hospice related questions or concerns.   Thank you for the opportunity to participate in this patient's care.  Randall Wilkins, Therapist, sports, [facility] Liaison  [phone]

## 2021-10-04 NOTE — Progress Notes (Signed)
Palliative Medicine Inpatient Follow Up Note  Consulting Provider: Mercy Riding, MD   Reason for consult:   [place] Palliative Medicine Consult  Reason for Consult? 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 fib/flutter on Eliquis, CAD, obesity, hard of hearing, recent "UTI" and ambulatory dysfunction presenting with progressive weakness and acute shortness of breath.  He is admitted with acute on chronic hypoxic RF due to CHF exacerbation.  He was hypoxic to 52% on RA requiring up to 6 L to recover.  BNP elevated to 1200.  CXR with pulmonary edema.    Palliative care was asked to get involved to discuss goals of care in the setting of multiple comorbid conditions.  Today's Discussion (10/04/2021):  *Please note that this is a verbal dictation therefore any spelling or grammatical errors are due to the "[employer] [employer]" system interpretation.  Chart reviewed.   I met with Randall Wilkins and his wife, Randall Wilkins at bedside early this morning. We reviewed that Randall Wilkins has had some restless nights and Dr. Carlis Abbott had initiated Seroquel last night which was helpful.  Randall Wilkins and I reviewed that the plan has changed and she would prefer at this point for him to transition to [facility]. We reviewed the level of care they provide.  Randall Wilkins shares that Randall Wilkins has no appetite which is difficult to see. I shared that as we transition in the the final phases of life our desire to eat declines which is a more normal part of the process. We reviewed that overtime he will likely become more somnolent as he journeys from this life to the next. Randall Wilkins expresses understanding and knowledge regarding this process as her mother went through something quite similar many years ago.  I shared with Randall Wilkins that I feel it may now be prudent to transition our focus to a full comfort  emphasis. We talked about transition to comfort measures in house and what that would entail inclusive of medications to control pain, dyspnea, agitation, nausea, itching, and hiccups.   We discussed stopping all uneccessary measures such as cardiac monitoring, blood draws, needle sticks, and frequent vital signs. Utilized reflective listening throughout our time together given the difficulty of this situation.   Questions and concerns were answered and palliative support was provided.  Primary care team aware of the change to comfort measures.  Objective Assessment: Vital Signs Vitals:   10/04/21 0215 10/04/21 0400  BP:  (!) 149/61  Pulse: 66 65  Resp: 18 19  Temp:  98.1 F (36.7 C)  SpO2: 96% 94%    Intake/Output Summary (Last 24 hours) at 10/04/2021 0744 Last data filed at 10/04/2021 0455 Gross per 24 hour  Intake 1200 ml  Output 1500 ml  Net -300 ml    Last Weight  Most recent update: 10/04/2021  2:16 AM    Weight  93.1 kg (205 lb 4 oz)            Gen: Frail elderly male in mild distress HEENT: moist mucous membranes CV: Regular rate and irregular rhythm PULM: On 20L HFNC with diffuse rhonchi and crackles bilaterally ABD: soft/nontender EXT: (+) edema Neuro: Alert and oriented x2 intermittently forgetful  SUMMARY OF RECOMMENDATIONS   DNAR/DNI  Transition focus to comfort care  Dilaudid 0.68m IV BID and PRN, [facility]  Will slowly wean O2 and treat symptoms   TOC - [place] when a bed is available   Ongoing incremental   Time Spent: 50 Greater than 50% of the time was spent in counseling and coordination of care ______________________________________________________________________________________ [place] Team Team Cell Phone: [phone] Please utilize secure chat with additional questions, if there is no response within 30 minutes please call the above phone  number  Palliative Medicine Team providers are available by phone from 7am to 7pm daily and can be reached through the team cell phone.  Should this patient require assistance outside of these hours, please call the patient's attending physician.

## 2021-10-04 NOTE — TOC Progression Note (Addendum)
Transition of Care [facility]) - Progression Note    Patient Details  Name: Randall Wilkins MRN: [id number] Date of Birth: [birth year]  Transition of Care [facility]) CM/SW [place], [place] Phone Number: [phone] 10/04/2021, 12:38 PM  Clinical Narrative:     CSW reached out to the [facility] representative to receive status on [facility] referral. Awaiting a response.  12:29PM- Referral is being reviewed by Maylon Cos MD for referral appropriateness however they do not have any beds today.  TOC team will continue to assist with discharge planning needs.       Expected Discharge Plan and Services                                                 Social Determinants of Health (SDOH) Interventions    Readmission Risk Interventions Readmission Risk Prevention Plan 03/03/2021  PCP or Specialist Appt within 3-5 Days Complete  HRI or [place] Complete  Social Work Consult for [place] Planning/Counseling Complete  Palliative Care Screening Not Applicable  Medication Review Press photographer) Complete  Some recent data might be hidden

## 2021-10-04 NOTE — Progress Notes (Signed)
Per RN, patient placed on 12L salter, currently tolerating well.

## 2021-10-04 NOTE — Progress Notes (Signed)
[facility]                                                                              Patient Demographics  Randall Wilkins, is a 80 y.o. male, DOB - [birth year], MAU:[id number]  Admit date - 09/26/2021   Admitting Physician Kristopher Oppenheim, DO  Outpatient Primary MD for the patient is [name], MD  Outpatient specialists:   LOS - 7  days   Medical records reviewed and are as summarized below:    No chief complaint on file.      Brief summary   Patient is a 80 year old male with history of combined CHF, stage IV lung CA, chronic hypoxic respiratory failure on 2 to 3 L O2 at home, paroxysmal atrial fibrillation, diabetes mellitus, CAD, obesity, hearing deficit presented with progressive weakness and acute shortness of breath.  He was admitted with acute on chronic hypoxic respiratory failure due to CHF exacerbation.  He was hypoxic to 52% on room air requiring up to 6 L.  BNP elevated to 1200.  Chest x-ray showed pulmonary edema.  Troponin 76> 83.  EKG showed rate controlled A. fib with occasional PVCs.  Patient was started on IV Lasix. Patient was noted to have respiratory distress, increasing oxygen requirement, worsening chest x-ray, placed on IV antibiotics, broadened to IV cefepime and Flagyl. On 10/18, required BiPAP and pulmonology was consulted. Cardiology and palliative medicine also following  Assessment & Plan    Principal Problem: Acute on chronic hypoxic respiratory failure on 2 to 3 L O2 at baseline Acute on chronic combined systolic and diastolic CHF -On 56/38, had respiratory distress requiring BiPAP, currently off.  On 15L O2 HFNC -Has history of OSA, previously was using CPAP at home (not lately) -Pulmonology was consulted, likely multifactorial due to CHF, third spacing, lymphangitic spread versus pneumonia, also recommended palliative approach,  -Still remains on 20 L HFNC O2, (2-3L outpatient) -Appreciate pulmonology and cardiology  recommendations, overall poor prognosis, also has underlying stage IV lung CA.  -Goals of care discussed with patient's wife on 10/21, now plan for residential hospice, comfort care -Much more alert and oriented today, slept well with Seroquel overnight.  Active Problems: Paroxysmal atrial fibrillation -DCCV was canceled due to tenuous respiratory status  -Appreciate palliative assistance, for now continue amiodarone  -Medications not necessary for comfort discontinued.    CAD S/P percutaneous coronary angioplasty, aortic stenosis -Currently no chest pain, status post CABG 2016.  -Currently comfort care  History of lung CA, adenocarcinoma right lung stage IV -Oncology notified, message sent to Dr. Julien Nordmann regarding worsening respiratory status -Now comfort care    Type 1 diabetes mellitus with nephropathy ([place]), IDDM with neuropathy, hyperglycemia, CKD -Placed on comfort care  AKI on chronic kidney disease (CKD), stage III (moderate) (HCC) - Baseline creatinine 1.7-1.9 -Creatinine at baseline 1.4, labs now discontinued     Essential hypertension -BP stable   Anemia of chronic disease normocytic -H&H stable  Obesity Estimated body mass index is 32.15 kg/m as calculated from the following:   Height as of this encounter: _0  (1.702 m).   Weight as of this encounter: 93.1 kg.  Code Status: DNR DVT Prophylaxis:  SCDs Start: 09/27/21 0317   Level of Care: Level of care: Telemetry Medical Family Communication:  Updated patient's wife at the bedside today.  Patient transition to comfort care and plan for residential hospice, when beds available   Disposition Plan:     Status is: Inpatient  Remains inpatient appropriate because: Hypoxic, on 20L O2 HFNC, baseline at home 2 to 3 L.Marland Kitchen  Pending [facility] when bed available  Time Spent in minutes 25 minutes  Procedures:    Consultants:   Cardiology Pulmonology Palliative medicine  Antimicrobials:    Anti-infectives (From admission, onward)    Start     Dose/Rate Route Frequency Ordered Stop   09/30/21 1000  metroNIDAZOLE (FLAGYL) tablet 500 mg  Status:  Discontinued        500 mg Oral Every 12 hours 09/30/21 0838 10/04/21 0719   09/28/21 1800  metroNIDAZOLE (FLAGYL) IVPB 500 mg  Status:  Discontinued        500 mg 100 mL/hr over 60 Minutes Intravenous Every 12 hours 09/28/21 1520 09/30/21 0838   09/28/21 1630  ceFEPIme (MAXIPIME) 2 g in sodium chloride 0.9 % 100 mL IVPB  Status:  Discontinued        2 g 200 mL/hr over 30 Minutes Intravenous Every 12 hours 09/28/21 1537 10/04/21 0719   09/27/21 0930  cefTRIAXone (ROCEPHIN) 1 g in sodium chloride 0.9 % 100 mL IVPB  Status:  Discontinued        1 g 200 mL/hr over 30 Minutes Intravenous Every 24 hours 09/27/21 0839 09/28/21 1520          Medications  Scheduled Meds:  amiodarone  200 mg Oral BID   arformoterol  15 mcg Nebulization BID   budesonide (PULMICORT) nebulizer solution  0.5 mg Nebulization BID   furosemide  20 mg Oral Daily    HYDROmorphone (DILAUDID) injection  0.5 mg Intravenous BID   QUEtiapine  50 mg Oral QHS   umeclidinium bromide  1 puff Inhalation Daily   Continuous Infusions:   PRN Meds:.acetaminophen **OR** acetaminophen, antiseptic oral rinse, bisacodyl, glycopyrrolate **OR** glycopyrrolate **OR** glycopyrrolate, haloperidol **OR** [DISCONTINUED] haloperidol **OR** haloperidol lactate, HYDROmorphone (DILAUDID) injection, ipratropium-albuterol, LORazepam, ondansetron **OR** ondansetron (ZOFRAN) IV, polyvinyl alcohol      Subjective:   Randall Wilkins was seen and examined today.  Slept well last night, much more alert and oriented today.  Wife at the bedside.  Had met with palliative this morning and transitioned to comfort care.  Patient and wife are both okay with comfort care and transition to residential hospice.  Objective:   Vitals:   10/04/21 0216 10/04/21 0400 10/04/21 0952 10/04/21 1036   BP:  (!) 149/61  (!) 155/103  Pulse:  65  66  Resp:  19  17  Temp:  98.1 F (36.7 C)  98.2 F (36.8 C)  TempSrc:  Oral  Oral  SpO2:  94% 96% (!) 88%  Weight: 93.1 kg     Height:        Intake/Output Summary (Last 24 hours) at 10/04/2021 1335 Last data filed at 10/04/2021 0455 Gross per 24 hour  Intake 960 ml  Output 1500 ml  Net -540 ml     Wt Readings from Last 3 Encounters:  10/04/21 93.1 kg  09/17/21 100.7 kg  09/09/21 101.2 kg   Physical Exam General: Alert and oriented x 3, NAD Cardiovascular: S1 S2 clear, RRR.  Respiratory: Diminished breath sound at the bases Gastrointestinal: Soft, nontender,  nondistended, NBS Ext: trace pedal edema bilaterally, chronic venous stasis changes  Data Reviewed:  I have personally reviewed following labs and imaging studies  Micro Results Recent Results (from the past 240 hour(s))  Blood Culture (routine x 2)     Status: None   Collection Time: 09/27/21 12:20 AM   Specimen: BLOOD  Result Value Ref Range Status   Specimen Description BLOOD PORTA CATH  Final   Special Requests   Final    BOTTLES DRAWN AEROBIC AND ANAEROBIC Blood Culture adequate volume   Culture   Final    NO GROWTH 5 DAYS Performed at [facility], [address], [place], [place] [address]    Report Status 10/02/2021 FINAL  Final  Resp Panel by RT-PCR (Flu A&B, Covid) Peripheral     Status: None   Collection Time: 09/27/21 12:25 AM   Specimen: Peripheral; Nasopharyngeal(NP) swabs in vial transport medium  Result Value Ref Range Status   SARS Coronavirus 2 by RT PCR NEGATIVE NEGATIVE Final    Comment: (NOTE) SARS-CoV-2 target nucleic acids are NOT DETECTED.  The SARS-CoV-2 RNA is generally detectable in upper respiratory specimens during the acute phase of infection. The lowest concentration of SARS-CoV-2 viral copies this assay can detect is 138 copies/mL. A negative result does not preclude SARS-Cov-2 infection and should not be used as the  sole basis for treatment or other patient management decisions. A negative result may occur with  improper specimen collection/handling, submission of specimen other than nasopharyngeal swab, presence of viral mutation(s) within the areas targeted by this assay, and inadequate number of viral copies(<138 copies/mL). A negative result must be combined with clinical observations, patient history, and epidemiological information. The expected result is Negative.  Fact Sheet for Patients:  EntrepreneurPulse.com.au  Fact Sheet for Healthcare Providers:  IncredibleEmployment.be  This test is no t yet approved or cleared by the [place] FDA and  has been authorized for detection and/or diagnosis of SARS-CoV-2 by FDA under an Emergency Use Authorization (EUA). This EUA will remain  in effect (meaning this test can be used) for the duration of the COVID-19 declaration under Section 564(b)(1) of the Act, 21 U.S.C.section 360bbb-3(b)(1), unless the authorization is terminated  or revoked sooner.       Influenza A by PCR NEGATIVE NEGATIVE Final   Influenza B by PCR NEGATIVE NEGATIVE Final    Comment: (NOTE) The Xpert Xpress SARS-CoV-2/FLU/RSV plus assay is intended as an aid in the diagnosis of influenza from Nasopharyngeal swab specimens and should not be used as a sole basis for treatment. Nasal washings and aspirates are unacceptable for Xpert Xpress SARS-CoV-2/FLU/RSV testing.  Fact Sheet for Patients: EntrepreneurPulse.com.au  Fact Sheet for Healthcare Providers: IncredibleEmployment.be  This test is not yet approved or cleared by the [place] FDA and has been authorized for detection and/or diagnosis of SARS-CoV-2 by FDA under an Emergency Use Authorization (EUA). This EUA will remain in effect (meaning this test can be used) for the duration of the COVID-19 declaration under Section 564(b)(1) of the  Act, 21 U.S.C. section 360bbb-3(b)(1), unless the authorization is terminated or revoked.  Performed at [facility], [place] [address], [place], [place] [address]   Blood Culture (routine x 2)     Status: None   Collection Time: 09/27/21 12:38 AM   Specimen: BLOOD LEFT FOREARM  Result Value Ref Range Status   Specimen Description BLOOD LEFT FOREARM  Final   Special Requests   Final  BOTTLES DRAWN AEROBIC AND ANAEROBIC Blood Culture adequate volume   Culture   Final    NO GROWTH 5 DAYS Performed at [facility], [place] [address] [place], [place] [address]    Report Status 10/02/2021 FINAL  Final    Radiology Reports CT Abdomen Pelvis Wo Contrast  Result Date: 09/10/2021 CLINICAL DATA:  Primary Cancer Type: Lung Imaging Indication: Assess response to therapy Interval therapy since last imaging? Yes Initial Cancer Diagnosis Date: 08/06/2020; Established by: Biopsy-proven Detailed Pathology: Stage IV non-small cell lung cancer, mucinous adenocarcinoma. Primary Tumor location: Left upper lobe, right lower lobe. Surgeries: CABG 2016.  Cholecystectomy.  Multiple back surgeries. Chemotherapy: Yes; Ongoing?  No; Most recent administration: 01/2021 Immunotherapy?  Yes; Type: Keytruda; Ongoing? No Radiation therapy? No Other Cancer Therapies: Lumakras daily. EXAM: CT CHEST, ABDOMEN AND PELVIS WITHOUT CONTRAST TECHNIQUE: Multidetector CT imaging of the chest, abdomen and pelvis was performed following the standard protocol without IV contrast. COMPARISON:  Most recent CT chest, abdomen and pelvis 06/17/2021. 08/22/2020 PET-CT. FINDINGS: CT CHEST FINDINGS Cardiovascular: Right IJ Port-A-Cath terminates in the high right atrium. Atherosclerotic calcification of the aorta and aortic valve. Heart is enlarged. No pericardial effusion. Mediastinum/Nodes: 1.4 cm low-attenuation right thyroid nodule. No follow-up recommended. (Ref: J Am Coll Radiol. 2015 Feb;12(2): 143-50).No pathologically enlarged  mediastinal or axillary lymph nodes. Hilar regions are difficult to evaluate without IV contrast. Esophagus is grossly unremarkable. Lungs/Pleura: Post treatment parenchymal retraction, bronchiectasis and volume loss in the posteromedial mid and lower right hemithorax, as before. Small associated partially loculated right pleural effusion. Slight improvement in left upper lobe consolidation with associated architectural distortion. New peribronchovascular ground-glass in the left lower lobe (4/91), likely infectious or inflammatory in etiology. Subpleural reticular densities and ground-glass in the peripheral left lower lobe, similar. Trace left pleural fluid. Airway is unremarkable. Musculoskeletal: Degenerative changes in the spine. No worrisome lytic or sclerotic lesions. CT ABDOMEN PELVIS FINDINGS Hepatobiliary: Liver is unremarkable. Cholecystectomy. No biliary ductal dilatation. Pancreas: Negative. Spleen: Negative. Adrenals/Urinary Tract: Adrenal glands are unremarkable. 4.6 cm fluid density lesion off the lower pole right kidney is likely a cyst. Other smaller low-attenuation lesions in the kidneys are too small to characterize. There is a subtle 1.8 cm lesion the posterior left kidney (2/73), as on prior exams. No urinary stones. Ureters are decompressed. Bladder is grossly unremarkable. Stomach/Bowel: Stomach is unremarkable. Duodenal diverticula incidentally noted. Small bowel and colon unremarkable. Appendix is questionably visualized. Vascular/Lymphatic: Atherosclerotic calcification of the aorta. No pathologically enlarged lymph nodes. Reproductive: Prostate is visualized. Other: Slight haziness in the anterior left anatomic pelvis (2/100), nonspecific and without a definite source. Mesenteries and peritoneum are otherwise unremarkable. Musculoskeletal: 6.6 x 9.9 cm partially calcified soft tissue mass posterior to the left sacroiliac joint and left sacrum, unchanged and possibly due to prior trauma.  Degenerative changes in the spine. No worrisome lytic or sclerotic lesions. IMPRESSION: 1. Post treatment changes in the hemithoraces bilaterally. No evidence of recurrent or metastatic disease. 2. Subtle 1.8 cm lesion off the posterior interpolar left kidney, as on prior exams. Renal cell carcinoma can not be excluded. 3. Small right pleural effusion. 4.  Aortic atherosclerosis (ICD10-I70.0). Electronically Signed   By: Lorin Picket M.D.   On: 09/10/2021 10:50   CT Chest Wo Contrast  Result Date: 09/10/2021 CLINICAL DATA:  Primary Cancer Type: Lung Imaging Indication: Assess response to therapy Interval therapy since last imaging? Yes Initial Cancer Diagnosis Date: 08/06/2020; Established by: Biopsy-proven Detailed Pathology: Stage IV non-small cell lung  cancer, mucinous adenocarcinoma. Primary Tumor location: Left upper lobe, right lower lobe. Surgeries: CABG 2016.  Cholecystectomy.  Multiple back surgeries. Chemotherapy: Yes; Ongoing?  No; Most recent administration: 01/2021 Immunotherapy?  Yes; Type: Keytruda; Ongoing? No Radiation therapy? No Other Cancer Therapies: Lumakras daily. EXAM: CT CHEST, ABDOMEN AND PELVIS WITHOUT CONTRAST TECHNIQUE: Multidetector CT imaging of the chest, abdomen and pelvis was performed following the standard protocol without IV contrast. COMPARISON:  Most recent CT chest, abdomen and pelvis 06/17/2021. 08/22/2020 PET-CT. FINDINGS: CT CHEST FINDINGS Cardiovascular: Right IJ Port-A-Cath terminates in the high right atrium. Atherosclerotic calcification of the aorta and aortic valve. Heart is enlarged. No pericardial effusion. Mediastinum/Nodes: 1.4 cm low-attenuation right thyroid nodule. No follow-up recommended. (Ref: J Am Coll Radiol. 2015 Feb;12(2): 143-50).No pathologically enlarged mediastinal or axillary lymph nodes. Hilar regions are difficult to evaluate without IV contrast. Esophagus is grossly unremarkable. Lungs/Pleura: Post treatment parenchymal retraction,  bronchiectasis and volume loss in the posteromedial mid and lower right hemithorax, as before. Small associated partially loculated right pleural effusion. Slight improvement in left upper lobe consolidation with associated architectural distortion. New peribronchovascular ground-glass in the left lower lobe (4/91), likely infectious or inflammatory in etiology. Subpleural reticular densities and ground-glass in the peripheral left lower lobe, similar. Trace left pleural fluid. Airway is unremarkable. Musculoskeletal: Degenerative changes in the spine. No worrisome lytic or sclerotic lesions. CT ABDOMEN PELVIS FINDINGS Hepatobiliary: Liver is unremarkable. Cholecystectomy. No biliary ductal dilatation. Pancreas: Negative. Spleen: Negative. Adrenals/Urinary Tract: Adrenal glands are unremarkable. 4.6 cm fluid density lesion off the lower pole right kidney is likely a cyst. Other smaller low-attenuation lesions in the kidneys are too small to characterize. There is a subtle 1.8 cm lesion the posterior left kidney (2/73), as on prior exams. No urinary stones. Ureters are decompressed. Bladder is grossly unremarkable. Stomach/Bowel: Stomach is unremarkable. Duodenal diverticula incidentally noted. Small bowel and colon unremarkable. Appendix is questionably visualized. Vascular/Lymphatic: Atherosclerotic calcification of the aorta. No pathologically enlarged lymph nodes. Reproductive: Prostate is visualized. Other: Slight haziness in the anterior left anatomic pelvis (2/100), nonspecific and without a definite source. Mesenteries and peritoneum are otherwise unremarkable. Musculoskeletal: 6.6 x 9.9 cm partially calcified soft tissue mass posterior to the left sacroiliac joint and left sacrum, unchanged and possibly due to prior trauma. Degenerative changes in the spine. No worrisome lytic or sclerotic lesions. IMPRESSION: 1. Post treatment changes in the hemithoraces bilaterally. No evidence of recurrent or metastatic  disease. 2. Subtle 1.8 cm lesion off the posterior interpolar left kidney, as on prior exams. Renal cell carcinoma can not be excluded. 3. Small right pleural effusion. 4.  Aortic atherosclerosis (ICD10-I70.0). Electronically Signed   By: Lorin Picket M.D.   On: 09/10/2021 10:50   DG Chest Port 1 View  Result Date: 09/30/2021 CLINICAL DATA:  Dyspnea EXAM: PORTABLE CHEST 1 VIEW COMPARISON:  09/28/2021, 09/26/2021, CT 09/09/2001 FINDINGS: Right-sided central venous port tip over the cavoatrial region. Cardiomegaly with vascular congestion. Suspected bilateral pleural effusions. Similar appearance of bilateral ground-glass opacities and patchy consolidations. No visible pneumothorax. IMPRESSION: Cardiomegaly with vascular congestion and suspected pleural effusions. Similar appearing bilateral ground-glass disease and patchy areas of consolidation Electronically Signed   By: Donavan Foil M.D.   On: 09/30/2021 16:32   DG CHEST PORT 1 VIEW  Result Date: 09/28/2021 CLINICAL DATA:  Dyspnea.  History of CHF EXAM: PORTABLE CHEST 1 VIEW COMPARISON:  09/28/2021 FINDINGS: Right-sided chest port remains in place. Cardiomegaly. Prior median sternotomy. Bilateral patchy airspace opacities persist, overall similar to the  previous radiograph. Small right pleural effusion. No pneumothorax. IMPRESSION: Bilateral patchy airspace opacities persist, overall similar to previous radiograph. Electronically Signed   By: Davina Poke D.O.   On: 09/28/2021 15:28   DG CHEST PORT 1 VIEW  Result Date: 09/28/2021 CLINICAL DATA:  Shortness of breath EXAM: PORTABLE CHEST 1 VIEW COMPARISON:  Chest x-ray dated 09/26/2021 FINDINGS: Increased bilateral perihilar and bibasilar opacities, some components suggesting dense consolidation and some components of ground-glass opacity. Probable small RIGHT pleural effusion. No pneumothorax is seen. Stable cardiomegaly. RIGHT chest wall Port-A-Cath is stable in position. Median sternotomy  wires in place. IMPRESSION: 1. Increased bilateral perihilar and bibasilar opacities, including some components of ground-glass opacity, compatible with multifocal pneumonia versus pulmonary edema. 2. Probable small RIGHT pleural effusion. Electronically Signed   By: Franki Cabot M.D.   On: 09/28/2021 07:33   DG Chest Port 1 View  Result Date: 09/26/2021 CLINICAL DATA:  Respiratory distress. EXAM: PORTABLE CHEST 1 VIEW COMPARISON:  May 01, 2021 FINDINGS: There is stable right-sided venous Port-A-Cath positioning. Multiple sternal wires are seen. Mild, diffuse, chronic appearing increased interstitial lung markings are seen. Mild, stable areas of atelectasis are seen within the bilateral lung bases and mid left lung. Stable elevation of the right hemidiaphragm is seen. There is no evidence of a pleural effusion or pneumothorax. The cardiac silhouette is enlarged and unchanged in size. A radiopaque fusion plate and screws are seen overlying the cervical spine. IMPRESSION: Chronic appearing increased interstitial lung markings with mild, stable bibasilar and mid left lung atelectasis. Electronically Signed   By: Virgina Norfolk M.D.   On: 09/26/2021 23:11   ECHOCARDIOGRAM COMPLETE  Result Date: 09/29/2021    ECHOCARDIOGRAM REPORT   Patient Name:   Randall Wilkins Date of Exam: 09/29/2021 Medical Rec #:  [id number]      Height:       67.0 in Accession #:    [id number]     Weight:       214.9 lb Date of Birth:  06-Oct-1941      BSA:          2.085 m Patient Age:    47 years       BP:           120/58 mmHg Patient Gender: M              HR:           69 bpm. Exam Location:  Inpatient Procedure: 2D Echo, Color Doppler and Cardiac Doppler Indications:    N00.37 Acute systolic (congestive) heart failure  History:        Patient has prior history of Echocardiogram examinations, most                 recent 05/06/2021. CHF, Prior CABG; Risk Factors:Hypertension,                 Diabetes and Dyslipidemia.  Sonographer:     Raquel Sarna Senior RDCS Referring Phys: [place]  1. Left ventricular ejection fraction, by estimation, is 45 to 50%. The left ventricle has mildly decreased function. The left ventricle demonstrates global hypokinesis. There is mild left ventricular hypertrophy. Left ventricular diastolic parameters are consistent with Grade III diastolic dysfunction (restrictive). There is the interventricular septum is flattened in systole and diastole, consistent with right ventricular pressure and volume overload.  2. Right ventricular systolic function is normal. The right ventricular size is normal. There is moderately elevated pulmonary artery systolic pressure.  The estimated right ventricular systolic pressure is 81.4 mmHg.  3. The mitral valve is normal in structure. Mild mitral valve regurgitation. No evidence of mitral stenosis.  4. Tricuspid valve regurgitation is severe.  5. The aortic valve is calcified. There is severe calcifcation of the aortic valve. There is severe thickening of the aortic valve. Aortic valve regurgitation is trivial. Moderate aortic valve stenosis. Aortic valve mean gradient measures 21.0 mmHg. Aortic valve Vmax measures 3.53 m/s.  6. The inferior vena cava is normal in size with greater than 50% respiratory variability, suggesting right atrial pressure of 3 mmHg. Comparison(s): Prior mean aortic valve gradient 17.5 mmHg. FINDINGS  Left Ventricle: Left ventricular ejection fraction, by estimation, is 45 to 50%. The left ventricle has mildly decreased function. The left ventricle demonstrates global hypokinesis. The left ventricular internal cavity size was normal in size. There is  mild left ventricular hypertrophy. The interventricular septum is flattened in systole and diastole, consistent with right ventricular pressure and volume overload. Left ventricular diastolic parameters are consistent with Grade III diastolic dysfunction (restrictive). Right Ventricle: The right  ventricular size is normal. No increase in right ventricular wall thickness. Right ventricular systolic function is normal. There is moderately elevated pulmonary artery systolic pressure. The tricuspid regurgitant velocity is 3.28 m/s, and with an assumed right atrial pressure of 8 mmHg, the estimated right ventricular systolic pressure is 48.1 mmHg. Left Atrium: Left atrial size was normal in size. Right Atrium: Right atrial size was normal in size. Pericardium: There is no evidence of pericardial effusion. Mitral Valve: The mitral valve is normal in structure. Mild mitral valve regurgitation. No evidence of mitral valve stenosis. MV peak gradient, 8.4 mmHg. The mean mitral valve gradient is 3.0 mmHg. Tricuspid Valve: The tricuspid valve is normal in structure. Tricuspid valve regurgitation is severe. No evidence of tricuspid stenosis. Aortic Valve: The aortic valve is calcified. There is severe calcifcation of the aortic valve. There is severe thickening of the aortic valve. Aortic valve regurgitation is trivial. Moderate aortic stenosis is present. Aortic valve mean gradient measures  21.0 mmHg. Aortic valve peak gradient measures 49.8 mmHg. Aortic valve area, by VTI measures 0.70 cm. Pulmonic Valve: The pulmonic valve was normal in structure. Pulmonic valve regurgitation is not visualized. No evidence of pulmonic stenosis. Aorta: The aortic root is normal in size and structure. Venous: The inferior vena cava is normal in size with greater than 50% respiratory variability, suggesting right atrial pressure of 3 mmHg. IAS/Shunts: No atrial level shunt detected by color flow Doppler.  LEFT VENTRICLE PLAX 2D LVIDd:         4.60 cm   Diastology LVIDs:         3.70 cm   LV e' medial:    9.11 cm/s LV PW:         1.30 cm   LV E/e' medial:  14.3 LV IVS:        1.20 cm   LV e' lateral:   11.30 cm/s LVOT diam:     1.90 cm   LV E/e' lateral: 11.5 LV SV:         54 LV SV Index:   26 LVOT Area:     2.84 cm  RIGHT VENTRICLE  RV S prime:     10.40 cm/s TAPSE (M-mode): 1.6 cm LEFT ATRIUM             Index        RIGHT ATRIUM  Index LA diam:        3.80 cm 1.82 cm/m   RA Area:     22.40 cm LA Vol (A2C):   62.4 ml 29.93 ml/m  RA Volume:   68.40 ml  32.80 ml/m LA Vol (A4C):   49.3 ml 23.64 ml/m LA Biplane Vol: 55.5 ml 26.62 ml/m  AORTIC VALVE AV Area (Vmax):    0.80 cm AV Area (Vmean):   1.06 cm AV Area (VTI):     0.70 cm AV Vmax:           353.00 cm/s AV Vmean:          198.000 cm/s AV VTI:            0.765 m AV Peak Grad:      49.8 mmHg AV Mean Grad:      21.0 mmHg LVOT Vmax:         99.80 cm/s LVOT Vmean:        73.900 cm/s LVOT VTI:          0.190 m LVOT/AV VTI ratio: 0.25  AORTA Ao Root diam: 3.10 cm Ao Asc diam:  3.10 cm MITRAL VALVE                TRICUSPID VALVE MV Area (PHT): 5.02 cm     TR Peak grad:   43.0 mmHg MV Area VTI:   1.78 cm     TR Vmax:        328.00 cm/s MV Peak grad:  8.4 mmHg MV Mean grad:  3.0 mmHg     SHUNTS MV Vmax:       1.45 m/s     Systemic VTI:  0.19 m MV Vmean:      87.3 cm/s    Systemic Diam: 1.90 cm MV Decel Time: 151 msec MV E velocity: 130.00 cm/s MV A velocity: 94.90 cm/s MV E/A ratio:  1.37 Candee Furbish MD Electronically signed by Candee Furbish MD Signature Date/Time: 09/29/2021/5:46:56 PM    Final     Lab Data:  CBC: Recent Labs  Lab 09/28/21 0449 09/29/21 0622 09/30/21 0515 10/01/21 0529  WBC 8.2 9.8 7.2  --   HGB 9.8* 9.7* 8.9* 8.8*  HCT 32.4* 31.6* 29.9* 29.6*  MCV 94.5 93.5 95.2  --   PLT 102* 110* 110*  --    Basic Metabolic Panel: Recent Labs  Lab 09/28/21 0449 09/28/21 0930 09/29/21 0703 09/30/21 0515 10/01/21 0529 10/04/21 0500  NA 138 139 136 139 133* 141  K 3.7 3.6 3.7 3.7 4.1 3.3*  CL 103 102 102 104 101 106  CO2 _0 GLUCOSE 105* 199* 141* 142* 223* 155*  BUN 35* 35* 36* 41* 48* 31*  CREATININE 1.99* 1.86* 1.76* 1.78* 1.93* 1.43*  CALCIUM 8.5* 8.5* 8.6* 8.6* 8.2* 8.4*  MG 2.0  --  2.3 2.0 2.2  --   PHOS 4.3  --  4.2 3.7 4.5   --    GFR: Estimated Creatinine Clearance: 44.8 mL/min (A) (by C-G formula based on SCr of 1.43 mg/dL (H)). Liver Function Tests: Recent Labs  Lab 09/28/21 0449 09/29/21 0703 09/30/21 0515 10/01/21 0529  ALBUMIN 2.7* 2.6* 2.2* 2.2*   No results for input(s): LIPASE, AMYLASE in the last 168 hours. No results for input(s): AMMONIA in the last 168 hours. Coagulation Profile: No results for input(s): INR, PROTIME in the last 168 hours.  Cardiac Enzymes: Recent Labs  Lab 09/28/21 0449  CKTOTAL  38*   BNP (last 3 results) No results for input(s): PROBNP in the last 8760 hours. HbA1C: No results for input(s): HGBA1C in the last 72 hours. CBG: Recent Labs  Lab 10/03/21 0540 10/03/21 1221 10/03/21 1629 10/03/21 2110 10/04/21 0512  GLUCAP 170* 237* 168* 124* 152*   Lipid Profile: No results for input(s): CHOL, HDL, LDLCALC, TRIG, CHOLHDL, LDLDIRECT in the last 72 hours. Thyroid Function Tests: No results for input(s): TSH, T4TOTAL, FREET4, T3FREE, THYROIDAB in the last 72 hours. Anemia Panel: No results for input(s): VITAMINB12, FOLATE, FERRITIN, TIBC, IRON, RETICCTPCT in the last 72 hours. Urine analysis:    Component Value Date/Time   COLORURINE YELLOW 09/27/2021 0021   APPEARANCEUR CLEAR 09/27/2021 0021   LABSPEC 1.011 09/27/2021 0021   PHURINE 5.0 09/27/2021 0021   GLUCOSEU NEGATIVE 09/27/2021 0021   GLUCOSEU NEGATIVE 08/29/2019 0959   HGBUR NEGATIVE 09/27/2021 0021   BILIRUBINUR NEGATIVE 09/27/2021 0021   KETONESUR NEGATIVE 09/27/2021 0021   PROTEINUR 30 (A) 09/27/2021 0021   UROBILINOGEN 0.2 08/29/2019 0959   NITRITE NEGATIVE 09/27/2021 0021   LEUKOCYTESUR NEGATIVE 09/27/2021 0021     Babyboy Loya M.D. [facility] 10/04/2021, 1:35 PM  Available via Epic secure chat 7am-7pm After 7 pm, please refer to night coverage provider listed on amion.

## 2021-10-05 DIAGNOSIS — C3491 Malignant neoplasm of unspecified part of right bronchus or lung: Secondary | ICD-10-CM | POA: Diagnosis not present

## 2021-10-05 DIAGNOSIS — Z515 Encounter for palliative care: Secondary | ICD-10-CM | POA: Diagnosis not present

## 2021-10-05 DIAGNOSIS — I5043 Acute on chronic combined systolic (congestive) and diastolic (congestive) heart failure: Secondary | ICD-10-CM | POA: Diagnosis not present

## 2021-10-05 DIAGNOSIS — J9601 Acute respiratory failure with hypoxia: Secondary | ICD-10-CM | POA: Diagnosis not present

## 2021-10-05 MED ORDER — TAMSULOSIN HCL 0.4 MG PO CAPS
0.4000 mg | ORAL_CAPSULE | Freq: Every day | ORAL | Status: DC
  Administered 2021-10-05 – 2021-10-06 (×2): 0.4 mg via ORAL
  Filled 2021-10-05 (×2): qty 1

## 2021-10-05 NOTE — Progress Notes (Signed)
[facility] ([facility]) Hospital Liaison: RN note      Notified by Transition of Care Manger of patient/family request for [facility] services at home after discharge rather than [facility].    Writer met at bedside with pt's spouse and son to initiate education related to hospice at home, services and team approach to care.  Both verbalized understanding of information given. Per discussion, plan is for discharge home via [facility] tomorrow.   Please send signed and completed DNR form home with patient/family.   Please provide prescriptions for mediations at discharge to ensure comfort until patient can be admitted onto hospice services.    DME needs have been discussed.  Patient currently has the following equipment in the home: shower bench and transport chair.  Patient/family requests the following DME for delivery to the home: hospital bed, overbed table, BSC, and O2@7L.  Family asks that DME be delivered tomorrow to allow time to get the space ready.  This liaison will contact DME provider to arrange delivery to the home. Home address has been verified and is correct in the chart.      Mrs. Boyan is the family member to contact to arrange time of delivery.       [facility] aware of the above. Please notify [facility] when patient is ready to leave the unit at discharge. (Call [phone] or [phone] after 5pm.)  [facility] information and contact numbers given to  Mrs. Shingler.     Please do not hesitate to call with questions.   Thank you for the opportunity to participate in this patient's care.   Chrislyn King, BSN, RN       [facility] Hospital Liaison (listed on AMION under Hospice /[facility])     [phone]  [phone] (24h on call)    

## 2021-10-05 NOTE — Progress Notes (Signed)
   Palliative Medicine Inpatient Follow Up Note  Consulting Provider: Mercy Riding, MD   Reason for consult:   [place] Palliative Medicine Consult  Reason for Consult? 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 fib/flutter on Eliquis, CAD, obesity, hard of hearing, recent "UTI" and ambulatory dysfunction presenting with progressive weakness and acute shortness of breath.  He is admitted with acute on chronic hypoxic RF due to CHF exacerbation.  He was hypoxic to 52% on RA requiring up to 6 L to recover.  BNP elevated to 1200.  CXR with pulmonary edema.    Palliative care was asked to get involved to discuss goals of care in the setting of multiple comorbid conditions.  Today's Discussion (10/05/2021):  *Please note that this is a verbal dictation therefore any spelling or grammatical errors are due to the "[employer]" system interpretation.  Chart reviewed.   I met with Wandell Scullion and his wife, Mickel Baas at bedside early this morning. Mickel Baas shares that Morrie has had his most restful night yet. She expresses that his present medical regiment is helping him tremendously. He continues to go down in O2 requirements.  [place] was asking to get OOB this morning and he was able to get up to the chair with SBA. He endorses that he still has little to no appetite.   I shared given how Karry looks today, he may be able to transition home with hospice as opposed to [facility]. We discussed that on the contrary this may just be a "good day" and cautious optimism is a reasonable emotion at this time.   Questions and concerns were answered and palliative support was provided.  Objective Assessment: Vital Signs Vitals:   10/05/21 0834 10/05/21 0835  BP:    Pulse:    Resp:    Temp:    SpO2: 100% 100%    Intake/Output Summary (Last 24 hours) at 10/05/2021  1129 Last data filed at 10/05/2021 0160 Gross per 24 hour  Intake 800 ml  Output 1100 ml  Net -300 ml    Last Weight  Most recent update: 10/04/2021  2:16 AM    Weight  93.1 kg (205 lb 4 oz)            Gen: Frail elderly male in mild distress HEENT: moist mucous membranes CV: Regular rate and irregular rhythm PULM: On 20L HFNC with diffuse rhonchi and crackles bilaterally ABD: soft/nontender EXT: (+) edema Neuro: Alert and oriented x2 intermittently forgetful  SUMMARY OF RECOMMENDATIONS   DNAR/DNI  Comfort care  Dilaudid 0.$[id number]'5mg'[id number]$  IV BID and PRN, additional comfort medications per [facility]  Will slowly wean O2 and treat symptoms   TOC - Transition [facility] vs. Home Hospice   Ongoing incremental   Time Spent: 35 Greater than 50% of the time was spent in counseling and coordination of care ______________________________________________________________________________________ [place] Team Team Cell Phone: [phone] Please utilize secure chat with additional questions, if there is no response within 30 minutes please call the above phone number  Palliative Medicine Team providers are available by phone from 7am to 7pm daily and can be reached through the team cell phone.  Should this patient require assistance outside of these hours, please call the patient's attending physician.

## 2021-10-05 NOTE — Progress Notes (Signed)
[facility]                                                                              Patient Demographics  Randall Wilkins, is a 80 y.o. male, DOB - [birth year], FXT:[id number]  Admit date - 09/26/2021   Admitting Physician Randall Oppenheim, DO  Outpatient Primary MD for the patient is Randall Arabian, MD  Outpatient specialists:   LOS - 8  days   Medical records reviewed and are as summarized below:    No chief complaint on file.      Brief summary   Patient is a 80 year old male with history of combined CHF, stage IV lung CA, chronic hypoxic respiratory failure on 2 to 3 L O2 at home, paroxysmal atrial fibrillation, diabetes mellitus, CAD, obesity, hearing deficit presented with progressive weakness and acute shortness of breath.  He was admitted with acute on chronic hypoxic respiratory failure due to CHF exacerbation.  He was hypoxic to 52% on room air requiring up to 6 L.  BNP elevated to 1200.  Chest x-ray showed pulmonary edema.  Troponin 76> 83.  EKG showed rate controlled A. fib with occasional PVCs.  Patient was started on IV Lasix. Patient was noted to have respiratory distress, increasing oxygen requirement, worsening chest x-ray, placed on IV antibiotics, broadened to IV cefepime and Flagyl. On 10/18, required BiPAP and pulmonology was consulted. Cardiology and palliative medicine also following  Assessment & Plan    Principal Problem: Acute on chronic hypoxic respiratory failure on 2 to 3 L O2 at baseline Acute on chronic combined systolic and diastolic CHF Underlying stage IV lung CA -On 10/18, had respiratory distress requiring BiPAP, currently off.  On 15L O2 HFNC -Has history of OSA, previously was using CPAP at home (not lately) -Pulmonology was consulted, likely multifactorial due to CHF, third spacing, lymphangitic spread versus pneumonia, also recommended palliative approach,  -Appreciate pulmonology, cardiology, palliative assistance.  .   -Patient was placed on comfort care status on 10/22, however much more alert now, O2 being weaned down, O2 sats 100% on 7 L HFNC, wean as tolerated for DC home in a.m. with hospice -Patient's wife feels comfortable now transitioning him to hospice at home  Active Problems: Paroxysmal atrial fibrillation -DCCV was canceled due to tenuous respiratory status  -Continue amiodarone -Medications not necessary for comfort discontinued.    CAD S/P percutaneous coronary angioplasty, aortic stenosis -Currently no chest pain, status post CABG 2016.  -No further cardiac work-up or interventions, continue comfort care  History of lung CA, adenocarcinoma right lung stage IV -Oncology notified, message sent to Randall Wilkins regarding worsening respiratory status -Now comfort care  Urinary retention -Follow bladder scan, resume Flomax 0.4 mg daily -If acute urinary retention necessitating Foley catheter, will need voiding trial at home.    Type 1 diabetes mellitus with nephropathy ([place]), IDDM with neuropathy, hyperglycemia, CKD -Placed on comfort care  AKI on chronic kidney disease (CKD), stage III (moderate) (HCC) - Baseline creatinine 1.7-1.9 -Creatinine at baseline 1.4, labs now discontinued    Essential hypertension -BP stable   Anemia of chronic disease normocytic -H&H stable  Obesity Estimated body  mass index is 32.15 kg/m as calculated from the following:   Height as of this encounter: 5\' 7"  (1.702 m).   Weight as of this encounter: 93.1 kg.  Code Status: DNR DVT Prophylaxis:  SCDs Start: 09/27/21 0317   Level of Care: Level of care: Telemetry Medical Family Communication:  Updated patient's wife at the bedside today.  Patient transition to comfort care and plan for residential hospice, when beds available   Disposition Plan:     Status is: Inpatient  Remains inpatient appropriate because: O2 being weaned down, patient much more alert and awake, hoping to DC home with  hospice in a.m.   Time Spent in minutes 25 minutes  Procedures:    Consultants:   Cardiology Pulmonology Palliative medicine  Antimicrobials:   Anti-infectives (From admission, onward)    Start     Dose/Rate Route Frequency Ordered Stop   09/30/21 1000  metroNIDAZOLE (FLAGYL) tablet 500 mg  Status:  Discontinued        500 mg Oral Every 12 hours 09/30/21 0838 10/04/21 0719   09/28/21 1800  metroNIDAZOLE (FLAGYL) IVPB 500 mg  Status:  Discontinued        500 mg 100 mL/hr over 60 Minutes Intravenous Every 12 hours 09/28/21 1520 09/30/21 0838   09/28/21 1630  ceFEPIme (MAXIPIME) 2 g in sodium chloride 0.9 % 100 mL IVPB  Status:  Discontinued        2 g 200 mL/hr over 30 Minutes Intravenous Every 12 hours 09/28/21 1537 10/04/21 0719   09/27/21 0930  cefTRIAXone (ROCEPHIN) 1 g in sodium chloride 0.9 % 100 mL IVPB  Status:  Discontinued        1 g 200 mL/hr over 30 Minutes Intravenous Every 24 hours 09/27/21 0839 09/28/21 1520          Medications  Scheduled Meds:  amiodarone  200 mg Oral BID   arformoterol  15 mcg Nebulization BID   budesonide (PULMICORT) nebulizer solution  0.5 mg Nebulization BID   furosemide  20 mg Oral Daily    HYDROmorphone (DILAUDID) injection  0.5 mg Intravenous BID   QUEtiapine  50 mg Oral QHS   tamsulosin  0.4 mg Oral Daily   umeclidinium bromide  1 puff Inhalation Daily   Continuous Infusions:   PRN Meds:.acetaminophen **OR** acetaminophen, antiseptic oral rinse, bisacodyl, glycopyrrolate **OR** glycopyrrolate **OR** glycopyrrolate, haloperidol **OR** [DISCONTINUED] haloperidol **OR** haloperidol lactate, HYDROmorphone (DILAUDID) injection, ipratropium-albuterol, LORazepam, ondansetron **OR** ondansetron (ZOFRAN) IV, polyvinyl alcohol      Subjective:   Randall Wilkins was seen and examined today.  Alert and awake, O2 sats 100% on 7 L HFNC.  Wife at bedside.  No acute issues overnight however having urinary retention.  Appears  comfortable.  Objective:   Vitals:   10/05/21 0740 10/05/21 0832 10/05/21 0834 10/05/21 0835  BP: (!) 166/76     Pulse: 62     Resp: 17     Temp: 98.1 F (36.7 C)     TempSrc: Oral     SpO2: 97% 100% 100% 100%  Weight:      Height:        Intake/Output Summary (Last 24 hours) at 10/05/2021 1354 Last data filed at 10/05/2021 1353 Gross per 24 hour  Intake 480 ml  Output 1500 ml  Net -1020 ml     Wt Readings from Last 3 Encounters:  10/04/21 93.1 kg  09/17/21 100.7 kg  09/09/21 101.2 kg    Physical Exam General: Alert and oriented x  3, NAD Cardiovascular: S1 S2 clear, RRR.  Trace pedal edema b/l Respiratory: Diminished breath sound at the bases  Gastrointestinal: Soft, nontender, nondistended, NBS Ext: trace pedal edema bilaterally  Data Reviewed:  I have personally reviewed following labs and imaging studies  Micro Results Recent Results (from the past 240 hour(s))  Blood Culture (routine x 2)     Status: None   Collection Time: 09/27/21 12:20 AM   Specimen: BLOOD  Result Value Ref Range Status   Specimen Description BLOOD PORTA CATH  Final   Special Requests   Final    BOTTLES DRAWN AEROBIC AND ANAEROBIC Blood Culture adequate volume   Culture   Final    NO GROWTH 5 DAYS Performed at [facility], [address], [place], [place] [address]    Report Status 10/02/2021 FINAL  Final  Resp Panel by RT-PCR (Flu A&B, Covid) Peripheral     Status: None   Collection Time: 09/27/21 12:25 AM   Specimen: Peripheral; Nasopharyngeal(NP) swabs in vial transport medium  Result Value Ref Range Status   SARS Coronavirus 2 by RT PCR NEGATIVE NEGATIVE Final    Comment: (NOTE) SARS-CoV-2 target nucleic acids are NOT DETECTED.  The SARS-CoV-2 RNA is generally detectable in upper respiratory specimens during the acute phase of infection. The lowest concentration of SARS-CoV-2 viral copies this assay can detect is 138 copies/mL. A negative result does not preclude  SARS-Cov-2 infection and should not be used as the sole basis for treatment or other patient management decisions. A negative result may occur with  improper specimen collection/handling, submission of specimen other than nasopharyngeal swab, presence of viral mutation(s) within the areas targeted by this assay, and inadequate number of viral copies(<138 copies/mL). A negative result must be combined with clinical observations, patient history, and epidemiological information. The expected result is Negative.  Fact Sheet for Patients:  EntrepreneurPulse.com.au  Fact Sheet for Healthcare Providers:  IncredibleEmployment.be  This test is no t yet approved or cleared by the [place] FDA and  has been authorized for detection and/or diagnosis of SARS-CoV-2 by FDA under an Emergency Use Authorization (EUA). This EUA will remain  in effect (meaning this test can be used) for the duration of the COVID-19 declaration under Section 564(b)(1) of the Act, 21 U.S.C.section 360bbb-3(b)(1), unless the authorization is terminated  or revoked sooner.       Influenza A by PCR NEGATIVE NEGATIVE Final   Influenza B by PCR NEGATIVE NEGATIVE Final    Comment: (NOTE) The Xpert Xpress SARS-CoV-2/FLU/RSV plus assay is intended as an aid in the diagnosis of influenza from Nasopharyngeal swab specimens and should not be used as a sole basis for treatment. Nasal washings and aspirates are unacceptable for Xpert Xpress SARS-CoV-2/FLU/RSV testing.  Fact Sheet for Patients: EntrepreneurPulse.com.au  Fact Sheet for Healthcare Providers: IncredibleEmployment.be  This test is not yet approved or cleared by the [place] FDA and has been authorized for detection and/or diagnosis of SARS-CoV-2 by FDA under an Emergency Use Authorization (EUA). This EUA will remain in effect (meaning this test can be used) for the duration of  the COVID-19 declaration under Section 564(b)(1) of the Act, 21 U.S.C. section 360bbb-3(b)(1), unless the authorization is terminated or revoked.  Performed at [facility], [place] [address], [place], [place] [address]   Blood Culture (routine x 2)     Status: None   Collection Time: 09/27/21 12:38 AM   Specimen: BLOOD LEFT FOREARM  Result Value Ref Range Status  Specimen Description BLOOD LEFT FOREARM  Final   Special Requests   Final    BOTTLES DRAWN AEROBIC AND ANAEROBIC Blood Culture adequate volume   Culture   Final    NO GROWTH 5 DAYS Performed at [facility], [address], [place], [place] [address]    Report Status 10/02/2021 FINAL  Final    Radiology Reports CT Abdomen Pelvis Wo Contrast  Result Date: 09/10/2021 CLINICAL DATA:  Primary Cancer Type: Lung Imaging Indication: Assess response to therapy Interval therapy since last imaging? Yes Initial Cancer Diagnosis Date: 08/06/2020; Established by: Biopsy-proven Detailed Pathology: Stage IV non-small cell lung cancer, mucinous adenocarcinoma. Primary Tumor location: Left upper lobe, right lower lobe. Surgeries: CABG 2016.  Cholecystectomy.  Multiple back surgeries. Chemotherapy: Yes; Ongoing?  No; Most recent administration: 01/2021 Immunotherapy?  Yes; Type: Keytruda; Ongoing? No Radiation therapy? No Other Cancer Therapies: Lumakras daily. EXAM: CT CHEST, ABDOMEN AND PELVIS WITHOUT CONTRAST TECHNIQUE: Multidetector CT imaging of the chest, abdomen and pelvis was performed following the standard protocol without IV contrast. COMPARISON:  Most recent CT chest, abdomen and pelvis 06/17/2021. 08/22/2020 PET-CT. FINDINGS: CT CHEST FINDINGS Cardiovascular: Right IJ Port-A-Cath terminates in the high right atrium. Atherosclerotic calcification of the aorta and aortic valve. Heart is enlarged. No pericardial effusion. Mediastinum/Nodes: 1.4 cm low-attenuation right thyroid nodule. No follow-up recommended. (Ref: J Am Coll  Radiol. 2015 Feb;12(2): 143-50).No pathologically enlarged mediastinal or axillary lymph nodes. Hilar regions are difficult to evaluate without IV contrast. Esophagus is grossly unremarkable. Lungs/Pleura: Post treatment parenchymal retraction, bronchiectasis and volume loss in the posteromedial mid and lower right hemithorax, as before. Small associated partially loculated right pleural effusion. Slight improvement in left upper lobe consolidation with associated architectural distortion. New peribronchovascular ground-glass in the left lower lobe (4/91), likely infectious or inflammatory in etiology. Subpleural reticular densities and ground-glass in the peripheral left lower lobe, similar. Trace left pleural fluid. Airway is unremarkable. Musculoskeletal: Degenerative changes in the spine. No worrisome lytic or sclerotic lesions. CT ABDOMEN PELVIS FINDINGS Hepatobiliary: Liver is unremarkable. Cholecystectomy. No biliary ductal dilatation. Pancreas: Negative. Spleen: Negative. Adrenals/Urinary Tract: Adrenal glands are unremarkable. 4.6 cm fluid density lesion off the lower pole right kidney is likely a cyst. Other smaller low-attenuation lesions in the kidneys are too small to characterize. There is a subtle 1.8 cm lesion the posterior left kidney (2/73), as on prior exams. No urinary stones. Ureters are decompressed. Bladder is grossly unremarkable. Stomach/Bowel: Stomach is unremarkable. Duodenal diverticula incidentally noted. Small bowel and colon unremarkable. Appendix is questionably visualized. Vascular/Lymphatic: Atherosclerotic calcification of the aorta. No pathologically enlarged lymph nodes. Reproductive: Prostate is visualized. Other: Slight haziness in the anterior left anatomic pelvis (2/100), nonspecific and without a definite source. Mesenteries and peritoneum are otherwise unremarkable. Musculoskeletal: 6.6 x 9.9 cm partially calcified soft tissue mass posterior to the left sacroiliac joint and  left sacrum, unchanged and possibly due to prior trauma. Degenerative changes in the spine. No worrisome lytic or sclerotic lesions. IMPRESSION: 1. Post treatment changes in the hemithoraces bilaterally. No evidence of recurrent or metastatic disease. 2. Subtle 1.8 cm lesion off the posterior interpolar left kidney, as on prior exams. Renal cell carcinoma can not be excluded. 3. Small right pleural effusion. 4.  Aortic atherosclerosis (ICD10-I70.0). Electronically Signed   By: Lorin Picket M.D.   On: 09/10/2021 10:50   CT Chest Wo Contrast  Result Date: 09/10/2021 CLINICAL DATA:  Primary Cancer Type: Lung Imaging Indication: Assess response to therapy Interval therapy since last  imaging? Yes Initial Cancer Diagnosis Date: 08/06/2020; Established by: Biopsy-proven Detailed Pathology: Stage IV non-small cell lung cancer, mucinous adenocarcinoma. Primary Tumor location: Left upper lobe, right lower lobe. Surgeries: CABG 2016.  Cholecystectomy.  Multiple back surgeries. Chemotherapy: Yes; Ongoing?  No; Most recent administration: 01/2021 Immunotherapy?  Yes; Type: Keytruda; Ongoing? No Radiation therapy? No Other Cancer Therapies: Lumakras daily. EXAM: CT CHEST, ABDOMEN AND PELVIS WITHOUT CONTRAST TECHNIQUE: Multidetector CT imaging of the chest, abdomen and pelvis was performed following the standard protocol without IV contrast. COMPARISON:  Most recent CT chest, abdomen and pelvis 06/17/2021. 08/22/2020 PET-CT. FINDINGS: CT CHEST FINDINGS Cardiovascular: Right IJ Port-A-Cath terminates in the high right atrium. Atherosclerotic calcification of the aorta and aortic valve. Heart is enlarged. No pericardial effusion. Mediastinum/Nodes: 1.4 cm low-attenuation right thyroid nodule. No follow-up recommended. (Ref: J Am Coll Radiol. 2015 Feb;12(2): 143-50).No pathologically enlarged mediastinal or axillary lymph nodes. Hilar regions are difficult to evaluate without IV contrast. Esophagus is grossly unremarkable.  Lungs/Pleura: Post treatment parenchymal retraction, bronchiectasis and volume loss in the posteromedial mid and lower right hemithorax, as before. Small associated partially loculated right pleural effusion. Slight improvement in left upper lobe consolidation with associated architectural distortion. New peribronchovascular ground-glass in the left lower lobe (4/91), likely infectious or inflammatory in etiology. Subpleural reticular densities and ground-glass in the peripheral left lower lobe, similar. Trace left pleural fluid. Airway is unremarkable. Musculoskeletal: Degenerative changes in the spine. No worrisome lytic or sclerotic lesions. CT ABDOMEN PELVIS FINDINGS Hepatobiliary: Liver is unremarkable. Cholecystectomy. No biliary ductal dilatation. Pancreas: Negative. Spleen: Negative. Adrenals/Urinary Tract: Adrenal glands are unremarkable. 4.6 cm fluid density lesion off the lower pole right kidney is likely a cyst. Other smaller low-attenuation lesions in the kidneys are too small to characterize. There is a subtle 1.8 cm lesion the posterior left kidney (2/73), as on prior exams. No urinary stones. Ureters are decompressed. Bladder is grossly unremarkable. Stomach/Bowel: Stomach is unremarkable. Duodenal diverticula incidentally noted. Small bowel and colon unremarkable. Appendix is questionably visualized. Vascular/Lymphatic: Atherosclerotic calcification of the aorta. No pathologically enlarged lymph nodes. Reproductive: Prostate is visualized. Other: Slight haziness in the anterior left anatomic pelvis (2/100), nonspecific and without a definite source. Mesenteries and peritoneum are otherwise unremarkable. Musculoskeletal: 6.6 x 9.9 cm partially calcified soft tissue mass posterior to the left sacroiliac joint and left sacrum, unchanged and possibly due to prior trauma. Degenerative changes in the spine. No worrisome lytic or sclerotic lesions. IMPRESSION: 1. Post treatment changes in the hemithoraces  bilaterally. No evidence of recurrent or metastatic disease. 2. Subtle 1.8 cm lesion off the posterior interpolar left kidney, as on prior exams. Renal cell carcinoma can not be excluded. 3. Small right pleural effusion. 4.  Aortic atherosclerosis (ICD10-I70.0). Electronically Signed   By: Lorin Picket M.D.   On: 09/10/2021 10:50   DG Chest Port 1 View  Result Date: 09/30/2021 CLINICAL DATA:  Dyspnea EXAM: PORTABLE CHEST 1 VIEW COMPARISON:  09/28/2021, 09/26/2021, CT 09/09/2001 FINDINGS: Right-sided central venous port tip over the cavoatrial region. Cardiomegaly with vascular congestion. Suspected bilateral pleural effusions. Similar appearance of bilateral ground-glass opacities and patchy consolidations. No visible pneumothorax. IMPRESSION: Cardiomegaly with vascular congestion and suspected pleural effusions. Similar appearing bilateral ground-glass disease and patchy areas of consolidation Electronically Signed   By: Donavan Foil M.D.   On: 09/30/2021 16:32   DG CHEST PORT 1 VIEW  Result Date: 09/28/2021 CLINICAL DATA:  Dyspnea.  History of CHF EXAM: PORTABLE CHEST 1 VIEW COMPARISON:  09/28/2021 FINDINGS: Right-sided chest  port remains in place. Cardiomegaly. Prior median sternotomy. Bilateral patchy airspace opacities persist, overall similar to the previous radiograph. Small right pleural effusion. No pneumothorax. IMPRESSION: Bilateral patchy airspace opacities persist, overall similar to previous radiograph. Electronically Signed   By: Davina Poke D.O.   On: 09/28/2021 15:28   DG CHEST PORT 1 VIEW  Result Date: 09/28/2021 CLINICAL DATA:  Shortness of breath EXAM: PORTABLE CHEST 1 VIEW COMPARISON:  Chest x-ray dated 09/26/2021 FINDINGS: Increased bilateral perihilar and bibasilar opacities, some components suggesting dense consolidation and some components of ground-glass opacity. Probable small RIGHT pleural effusion. No pneumothorax is seen. Stable cardiomegaly. RIGHT chest wall  Port-A-Cath is stable in position. Median sternotomy wires in place. IMPRESSION: 1. Increased bilateral perihilar and bibasilar opacities, including some components of ground-glass opacity, compatible with multifocal pneumonia versus pulmonary edema. 2. Probable small RIGHT pleural effusion. Electronically Signed   By: Franki Cabot M.D.   On: 09/28/2021 07:33   DG Chest Port 1 View  Result Date: 09/26/2021 CLINICAL DATA:  Respiratory distress. EXAM: PORTABLE CHEST 1 VIEW COMPARISON:  May 01, 2021 FINDINGS: There is stable right-sided venous Port-A-Cath positioning. Multiple sternal wires are seen. Mild, diffuse, chronic appearing increased interstitial lung markings are seen. Mild, stable areas of atelectasis are seen within the bilateral lung bases and mid left lung. Stable elevation of the right hemidiaphragm is seen. There is no evidence of a pleural effusion or pneumothorax. The cardiac silhouette is enlarged and unchanged in size. A radiopaque fusion plate and screws are seen overlying the cervical spine. IMPRESSION: Chronic appearing increased interstitial lung markings with mild, stable bibasilar and mid left lung atelectasis. Electronically Signed   By: Virgina Norfolk M.D.   On: 09/26/2021 23:11   ECHOCARDIOGRAM COMPLETE  Result Date: 09/29/2021    ECHOCARDIOGRAM REPORT   Patient Name:   Randall Wilkins Date of Exam: 09/29/2021 Medical Rec #:  [id number]      Height:       67.0 in Accession #:    [id number]     Weight:       214.9 lb Date of Birth:  1941-08-13      BSA:          2.085 m Patient Age:    66 years       BP:           120/58 mmHg Patient Gender: M              HR:           69 bpm. Exam Location:  Inpatient Procedure: 2D Echo, Color Doppler and Cardiac Doppler Indications:    R67.89 Acute systolic (congestive) heart failure  History:        Patient has prior history of Echocardiogram examinations, most                 recent 05/06/2021. CHF, Prior CABG; Risk Factors:Hypertension,                  Diabetes and Dyslipidemia.  Sonographer:    Raquel Sarna Senior RDCS Referring Phys: [place]  1. Left ventricular ejection fraction, by estimation, is 45 to 50%. The left ventricle has mildly decreased function. The left ventricle demonstrates global hypokinesis. There is mild left ventricular hypertrophy. Left ventricular diastolic parameters are consistent with Grade III diastolic dysfunction (restrictive). There is the interventricular septum is flattened in systole and diastole, consistent with right ventricular pressure and volume overload.  2. Right ventricular systolic  function is normal. The right ventricular size is normal. There is moderately elevated pulmonary artery systolic pressure. The estimated right ventricular systolic pressure is 06.2 mmHg.  3. The mitral valve is normal in structure. Mild mitral valve regurgitation. No evidence of mitral stenosis.  4. Tricuspid valve regurgitation is severe.  5. The aortic valve is calcified. There is severe calcifcation of the aortic valve. There is severe thickening of the aortic valve. Aortic valve regurgitation is trivial. Moderate aortic valve stenosis. Aortic valve mean gradient measures 21.0 mmHg. Aortic valve Vmax measures 3.53 m/s.  6. The inferior vena cava is normal in size with greater than 50% respiratory variability, suggesting right atrial pressure of 3 mmHg. Comparison(s): Prior mean aortic valve gradient 17.5 mmHg. FINDINGS  Left Ventricle: Left ventricular ejection fraction, by estimation, is 45 to 50%. The left ventricle has mildly decreased function. The left ventricle demonstrates global hypokinesis. The left ventricular internal cavity size was normal in size. There is  mild left ventricular hypertrophy. The interventricular septum is flattened in systole and diastole, consistent with right ventricular pressure and volume overload. Left ventricular diastolic parameters are consistent with Grade III diastolic  dysfunction (restrictive). Right Ventricle: The right ventricular size is normal. No increase in right ventricular wall thickness. Right ventricular systolic function is normal. There is moderately elevated pulmonary artery systolic pressure. The tricuspid regurgitant velocity is 3.28 m/s, and with an assumed right atrial pressure of 8 mmHg, the estimated right ventricular systolic pressure is 69.4 mmHg. Left Atrium: Left atrial size was normal in size. Right Atrium: Right atrial size was normal in size. Pericardium: There is no evidence of pericardial effusion. Mitral Valve: The mitral valve is normal in structure. Mild mitral valve regurgitation. No evidence of mitral valve stenosis. MV peak gradient, 8.4 mmHg. The mean mitral valve gradient is 3.0 mmHg. Tricuspid Valve: The tricuspid valve is normal in structure. Tricuspid valve regurgitation is severe. No evidence of tricuspid stenosis. Aortic Valve: The aortic valve is calcified. There is severe calcifcation of the aortic valve. There is severe thickening of the aortic valve. Aortic valve regurgitation is trivial. Moderate aortic stenosis is present. Aortic valve mean gradient measures  21.0 mmHg. Aortic valve peak gradient measures 49.8 mmHg. Aortic valve area, by VTI measures 0.70 cm. Pulmonic Valve: The pulmonic valve was normal in structure. Pulmonic valve regurgitation is not visualized. No evidence of pulmonic stenosis. Aorta: The aortic root is normal in size and structure. Venous: The inferior vena cava is normal in size with greater than 50% respiratory variability, suggesting right atrial pressure of 3 mmHg. IAS/Shunts: No atrial level shunt detected by color flow Doppler.  LEFT VENTRICLE PLAX 2D LVIDd:         4.60 cm   Diastology LVIDs:         3.70 cm   LV e' medial:    9.11 cm/s LV PW:         1.30 cm   LV E/e' medial:  14.3 LV IVS:        1.20 cm   LV e' lateral:   11.30 cm/s LVOT diam:     1.90 cm   LV E/e' lateral: 11.5 LV SV:         54 LV SV  Index:   26 LVOT Area:     2.84 cm  RIGHT VENTRICLE RV S prime:     10.40 cm/s TAPSE (M-mode): 1.6 cm LEFT ATRIUM  Index        RIGHT ATRIUM           Index LA diam:        3.80 cm 1.82 cm/m   RA Area:     22.40 cm LA Vol (A2C):   62.4 ml 29.93 ml/m  RA Volume:   68.40 ml  32.80 ml/m LA Vol (A4C):   49.3 ml 23.64 ml/m LA Biplane Vol: 55.5 ml 26.62 ml/m  AORTIC VALVE AV Area (Vmax):    0.80 cm AV Area (Vmean):   1.06 cm AV Area (VTI):     0.70 cm AV Vmax:           353.00 cm/s AV Vmean:          198.000 cm/s AV VTI:            0.765 m AV Peak Grad:      49.8 mmHg AV Mean Grad:      21.0 mmHg LVOT Vmax:         99.80 cm/s LVOT Vmean:        73.900 cm/s LVOT VTI:          0.190 m LVOT/AV VTI ratio: 0.25  AORTA Ao Root diam: 3.10 cm Ao Asc diam:  3.10 cm MITRAL VALVE                TRICUSPID VALVE MV Area (PHT): 5.02 cm     TR Peak grad:   43.0 mmHg MV Area VTI:   1.78 cm     TR Vmax:        328.00 cm/s MV Peak grad:  8.4 mmHg MV Mean grad:  3.0 mmHg     SHUNTS MV Vmax:       1.45 m/s     Systemic VTI:  0.19 m MV Vmean:      87.3 cm/s    Systemic Diam: 1.90 cm MV Decel Time: 151 msec MV E velocity: 130.00 cm/s MV A velocity: 94.90 cm/s MV E/A ratio:  1.37 Candee Furbish MD Electronically signed by Candee Furbish MD Signature Date/Time: 09/29/2021/5:46:56 PM    Final     Lab Data:  CBC: Recent Labs  Lab 09/29/21 0622 09/30/21 0515 10/01/21 0529  WBC 9.8 7.2  --   HGB 9.7* 8.9* 8.8*  HCT 31.6* 29.9* 29.6*  MCV 93.5 95.2  --   PLT 110* 110*  --    Basic Metabolic Panel: Recent Labs  Lab 09/29/21 0703 09/30/21 0515 10/01/21 0529 10/04/21 0500  NA 136 139 133* 141  K 3.7 3.7 4.1 3.3*  CL 102 104 101 106  CO2 24 26 23 27   GLUCOSE 141* 142* 223* 155*  BUN 36* 41* 48* 31*  CREATININE 1.76* 1.78* 1.93* 1.43*  CALCIUM 8.6* 8.6* 8.2* 8.4*  MG 2.3 2.0 2.2  --   PHOS 4.2 3.7 4.5  --    GFR: Estimated Creatinine Clearance: 44.8 mL/min (A) (by C-G formula based on SCr of 1.43 mg/dL  (H)). Liver Function Tests: Recent Labs  Lab 09/29/21 0703 09/30/21 0515 10/01/21 0529  ALBUMIN 2.6* 2.2* 2.2*   No results for input(s): LIPASE, AMYLASE in the last 168 hours. No results for input(s): AMMONIA in the last 168 hours. Coagulation Profile: No results for input(s): INR, PROTIME in the last 168 hours.  Cardiac Enzymes: No results for input(s): CKTOTAL, CKMB, CKMBINDEX, TROPONINI in the last 168 hours.  BNP (last 3 results) No results for input(s): PROBNP in the last  8760 hours. HbA1C: No results for input(s): HGBA1C in the last 72 hours. CBG: Recent Labs  Lab 10/03/21 0540 10/03/21 1221 10/03/21 1629 10/03/21 2110 10/04/21 0512  GLUCAP 170* 237* 168* 124* 152*   Lipid Profile: No results for input(s): CHOL, HDL, LDLCALC, TRIG, CHOLHDL, LDLDIRECT in the last 72 hours. Thyroid Function Tests: No results for input(s): TSH, T4TOTAL, FREET4, T3FREE, THYROIDAB in the last 72 hours. Anemia Panel: No results for input(s): VITAMINB12, FOLATE, FERRITIN, TIBC, IRON, RETICCTPCT in the last 72 hours. Urine analysis:    Component Value Date/Time   COLORURINE YELLOW 09/27/2021 0021   APPEARANCEUR CLEAR 09/27/2021 0021   LABSPEC 1.011 09/27/2021 0021   PHURINE 5.0 09/27/2021 0021   GLUCOSEU NEGATIVE 09/27/2021 0021   GLUCOSEU NEGATIVE 08/29/2019 0959   HGBUR NEGATIVE 09/27/2021 0021   BILIRUBINUR NEGATIVE 09/27/2021 0021   KETONESUR NEGATIVE 09/27/2021 0021   PROTEINUR 30 (A) 09/27/2021 0021   UROBILINOGEN 0.2 08/29/2019 0959   NITRITE NEGATIVE 09/27/2021 0021   LEUKOCYTESUR NEGATIVE 09/27/2021 0021     Damyon Mullane M.D. [facility] 10/05/2021, 1:54 PM  Available via Epic secure chat 7am-7pm After 7 pm, please refer to night coverage provider listed on amion.

## 2021-10-06 DIAGNOSIS — J9601 Acute respiratory failure with hypoxia: Secondary | ICD-10-CM | POA: Diagnosis not present

## 2021-10-06 DIAGNOSIS — I5043 Acute on chronic combined systolic (congestive) and diastolic (congestive) heart failure: Secondary | ICD-10-CM | POA: Diagnosis not present

## 2021-10-06 DIAGNOSIS — C3491 Malignant neoplasm of unspecified part of right bronchus or lung: Secondary | ICD-10-CM | POA: Diagnosis not present

## 2021-10-06 LAB — GLUCOSE, CAPILLARY
Glucose-Capillary: 308 mg/dL — ABNORMAL HIGH (ref 70–99)
Glucose-Capillary: 301 mg/dL — ABNORMAL HIGH (ref 70–99)

## 2021-10-06 MED ORDER — TAMSULOSIN HCL 0.4 MG PO CAPS: 0.4000 mg | ORAL_CAPSULE | Freq: Every day | ORAL | 3 refills | Status: DC

## 2021-10-06 MED ORDER — FUROSEMIDE 20 MG PO TABS: 20.0000 mg | ORAL_TABLET | Freq: Every day | ORAL | 3 refills | Status: DC

## 2021-10-06 MED ORDER — QUETIAPINE FUMARATE 50 MG PO TABS: 50.0000 mg | ORAL_TABLET | Freq: Every day | ORAL | 0 refills | Status: DC

## 2021-10-06 NOTE — Discharge Summary (Addendum)
Physician Discharge Summary   Patient ID: Randall Wilkins MRN: [id number] DOB/AGE: [birth year] 80 y.o.  Admit date: 09/26/2021 Discharge date: 10/06/2021  Primary Care Physician:  [name], MD   Recommendations for Outpatient Follow-up:  Patient now comfort care, home with hospice  Home Health: Home with hospice Equipment/Devices:   Discharge Condition: Overall poor prognosis CODE STATUS: DNR/DNI comfort care Diet recommendation: Heart healthy diet   Discharge Diagnoses:    Acute on chronic respiratory failure with hypoxia ([place]) -currently on 6-7 LO2  Acute on chronic combined systolic and diastolic CHF (congestive heart failure) (HCC)  Type 1 diabetes mellitus with nephropathy (HCC)  Obesity (BMI 30-39.9)  AKI on chronic kidney disease (CKD), stage III (moderate) (HCC)  Adenocarcinoma of right lung, stage 4 (HCC)  Essential hypertension  Paroxysmal atrial fibrillation  Coronary disease status post percutaneous coronary angioplasty, aortic stenosis  Obesity (BMI 30-39.9)  Consults:   Cardiology Pulmonology Palliative care Oncology notified    Allergies:   Allergies  Allergen Reactions   Actos [Pioglitazone] Swelling   Spironolactone Itching   Metformin And Related Nausea Only    3.16.2022 pt is currently taking this medication at home   Niaspan [Niacin Er] Itching and Rash   Wound Dressing Adhesive Rash     DISCHARGE MEDICATIONS: Allergies as of 10/06/2021       Reactions   Actos [pioglitazone] Swelling   Spironolactone Itching   Metformin And Related Nausea Only   3.16.2022 pt is currently taking this medication at home   Niaspan [niacin Er] Itching, Rash   Wound Dressing Adhesive Rash        Medication List     STOP taking these medications    allopurinol 100 MG tablet Commonly known as: ZYLOPRIM   amitriptyline 50 MG tablet Commonly known as: ELAVIL   amLODipine 10 MG tablet Commonly known as: NORVASC   apixaban 5 MG Tabs  tablet Commonly known as: Eliquis   cephALEXin 500 MG capsule Commonly known as: KEFLEX   cetirizine 10 MG tablet Commonly known as: ZYRTEC   colchicine 0.6 MG tablet   Contour Next Monitor w/Device Kit   Contour Next Test test strip Generic drug: glucose blood   Easy Comfort Pen Needles 33G X 4 MM Misc Generic drug: Insulin Pen Needle   HumaLOG 100 UNIT/ML cartridge Generic drug: insulin lispro   hydrALAZINE 25 MG tablet Commonly known as: APRESOLINE   isosorbide dinitrate 20 MG tablet Commonly known as: ISORDIL   levothyroxine 88 MCG tablet Commonly known as: SYNTHROID   lisinopril 10 MG tablet Commonly known as: ZESTRIL   Lumakras 120 MG Tabs Generic drug: sotorasib   OXYGEN   potassium chloride SA 20 MEQ tablet Commonly known as: KLOR-CON   rosuvastatin 20 MG tablet Commonly known as: CRESTOR   Tresiba FlexTouch 200 UNIT/ML FlexTouch Pen Generic drug: insulin degludec   vitamin B-12 1000 MCG tablet Commonly known as: CYANOCOBALAMIN       TAKE these medications    acetaminophen 500 MG tablet Commonly known as: TYLENOL Take 1,000 mg by mouth every 6 (six) hours as needed for moderate pain or headache.   albuterol 108 (90 Base) MCG/ACT inhaler Commonly known as: VENTOLIN HFA Inhale 2 puffs into the lungs every 6 (six) hours as needed for wheezing or shortness of breath.   amiodarone 200 MG tablet Commonly known as: PACERONE Take 1 tablet (200 mg total) by mouth daily. What changed: when to take this   furosemide 20 MG tablet Commonly known  as: LASIX Take 1 tablet (20 mg total) by mouth daily. What changed:  medication strength how much to take when to take this   lidocaine-prilocaine cream Commonly known as: EMLA APPLY A TEASPOON OVER PORT SITE AT [place] BEFORE LAB APPOINTMENT. DO NOT RUB IN AND COVER WITH PLASTIC WRAP What changed: See the new instructions.   ondansetron 4 MG tablet Commonly known as: ZOFRAN Take 1 tablet (4  mg total) by mouth every 6 (six) hours as needed for nausea.   prochlorperazine 10 MG tablet Commonly known as: COMPAZINE Take 1 tablet (10 mg total) by mouth every 6 (six) hours as needed for nausea or vomiting.   QUEtiapine 50 MG tablet Commonly known as: SEROQUEL Take 1 tablet (50 mg total) by mouth at bedtime.   tamsulosin 0.4 MG Caps capsule Commonly known as: FLOMAX Take 1 capsule (0.4 mg total) by mouth at bedtime.         Brief H and P: For complete details please refer to admission H and P, but in brief Patient is a 80 year old male with history of combined CHF, stage IV lung CA, chronic hypoxic respiratory failure on 2 to 3 L O2 at home, paroxysmal atrial fibrillation, diabetes mellitus, CAD, obesity, hearing deficit presented with progressive weakness and acute shortness of breath.  He was admitted with acute on chronic hypoxic respiratory failure due to CHF exacerbation.  He was hypoxic to 52% on room air requiring up to 6 L.  BNP elevated to 1200.  Chest x-ray showed pulmonary edema.  Troponin 76> 83.  EKG showed rate controlled A. fib with occasional PVCs.  Patient was started on IV Lasix. Patient was noted to have respiratory distress, increasing oxygen requirement, worsening chest x-ray, placed on IV antibiotics, broadened to IV cefepime and Flagyl. On 10/18, required BiPAP and pulmonology was consulted. Cardiology and palliative medicine also following  Hospital Course:   Acute on chronic hypoxic respiratory failure on 2 to 3 L O2 at baseline Acute on chronic combined systolic and diastolic CHF Underlying stage IV lung CA -On 10/18, had respiratory distress requiring BiPAP, subsequently needing up to 20 L HFNC, difficult to wean -Has history of OSA, previously was using CPAP at home (not lately), has stage IV lung CA -Followed by cardiology, initially placed on IV diuresis, transitioned to oral Lasix currently on 20 mg daily  -pulmonology was consulted, likely  multifactorial due to CHF, third spacing, lymphangitic spread versus pneumonia, also recommended palliative approach,  -Palliative medicine was consulted, and family agreed for hospice and comfort care.  -Patient was placed on comfort care status on 10/22, however much more alert now, O2 weaned down to 7 L O2.  Family now feels comfortable transitioning him to hospice at home.   Paroxysmal atrial fibrillation -Initially plan for cardioversion was canceled due to tenuous respiratory status  -Continue amiodarone -Medications not necessary for comfort discontinued.     CAD S/P percutaneous coronary angioplasty, aortic stenosis -Currently no chest pain, status post CABG 2016.  -No further cardiac work-up or interventions, continue comfort care   History of lung CA, adenocarcinoma right lung stage IV -Oncology notified, message sent to Dr. Julien Nordmann regarding worsening respiratory status -Now comfort care   Urinary retention -Resumed Flomax 0.4 mg daily -If patient develops any urinary retention, may place Foley for comfort at home by hospice.     Type 1 diabetes mellitus with nephropathy ([place]), IDDM with neuropathy, hyperglycemia, CKD -Placed on comfort care   AKI on chronic kidney  disease (CKD), stage III (moderate) (HCC) - Baseline creatinine 1.7-1.9 -Creatinine at baseline 1.4, labs now discontinued     Essential hypertension -BP stable     Anemia of chronic disease normocytic -H&H stable   Obesity Estimated body mass index is 32.15 kg/m as calculated from the following:   Height as of this encounter: '5\' 7"'  (1.702 m).   Weight as of this encounter: 93.1 kg.  Insomnia, sundowning -Doing well with Seroquel, will continue    Day of Discharge S: Alert and awake, oriented, wife and son at the bedside.  Feels ready to go home today.  Overnight no acute issues, slept good.   BP (!) 149/51 (BP Location: Left Arm)   Pulse (!) 59   Temp 98.1 F (36.7 C) (Oral)   Resp 19   Ht  '5\' 7"'  (1.702 m)   Wt 93.1 kg   SpO2 97%   BMI 32.15 kg/m   Physical Exam: General: Alert and awake oriented x3, NAD, ill-appearing, hearing deficit CVS: S1-S2 clear no murmur rubs or gallops Chest:  diminished breath sound at the bases Abdomen: soft nontender, nondistended, normal bowel sounds Extremities: Trace edema bilaterally    Get Medicines reviewed and adjusted: Please take all your medications with you for your next visit with your Primary MD  Please request your Primary MD to go over all hospital tests and procedure/radiological results at the follow up. Please ask your Primary MD to get all Hospital records sent to his/her office.  If you experience worsening of your admission symptoms, develop shortness of breath, life threatening emergency, suicidal or homicidal thoughts you must seek medical attention immediately by calling 911 or calling your MD immediately  if symptoms less severe.  You must read complete instructions/literature along with all the possible adverse reactions/side effects for all the Medicines you take and that have been prescribed to you. Take any new Medicines after you have completely understood and accept all the possible adverse reactions/side effects.   Do not drive when taking pain medications.   Do not take more than prescribed Pain, Sleep and Anxiety Medications  Special Instructions: If you have smoked or chewed Tobacco  in the last 2 yrs please stop smoking, stop any regular Alcohol  and or any Recreational drug use.  Wear Seat belts while driving.  Please note  You were cared for by a hospitalist during your hospital stay. Once you are discharged, your primary care physician will handle any further medical issues. Please note that NO REFILLS for any discharge medications will be authorized once you are discharged, as it is imperative that you return to your primary care physician (or establish a relationship with a primary care physician if  you do not have one) for your aftercare needs so that they can reassess your need for medications and monitor your lab values.   The results of significant diagnostics from this hospitalization (including imaging, microbiology, ancillary and laboratory) are listed below for reference.      Procedures/Studies:  CT Abdomen Pelvis Wo Contrast  Result Date: 09/10/2021 CLINICAL DATA:  Primary Cancer Type: Lung Imaging Indication: Assess response to therapy Interval therapy since last imaging? Yes Initial Cancer Diagnosis Date: 08/06/2020; Established by: Biopsy-proven Detailed Pathology: Stage IV non-small cell lung cancer, mucinous adenocarcinoma. Primary Tumor location: Left upper lobe, right lower lobe. Surgeries: CABG 2016.  Cholecystectomy.  Multiple back surgeries. Chemotherapy: Yes; Ongoing?  No; Most recent administration: 01/2021 Immunotherapy?  Yes; Type: Keytruda; Ongoing? No Radiation therapy? No Other  Cancer Therapies: Lumakras daily. EXAM: CT CHEST, ABDOMEN AND PELVIS WITHOUT CONTRAST TECHNIQUE: Multidetector CT imaging of the chest, abdomen and pelvis was performed following the standard protocol without IV contrast. COMPARISON:  Most recent CT chest, abdomen and pelvis 06/17/2021. 08/22/2020 PET-CT. FINDINGS: CT CHEST FINDINGS Cardiovascular: Right IJ Port-A-Cath terminates in the high right atrium. Atherosclerotic calcification of the aorta and aortic valve. Heart is enlarged. No pericardial effusion. Mediastinum/Nodes: 1.4 cm low-attenuation right thyroid nodule. No follow-up recommended. (Ref: J Am Coll Radiol. 2015 Feb;12(2): 143-50).No pathologically enlarged mediastinal or axillary lymph nodes. Hilar regions are difficult to evaluate without IV contrast. Esophagus is grossly unremarkable. Lungs/Pleura: Post treatment parenchymal retraction, bronchiectasis and volume loss in the posteromedial mid and lower right hemithorax, as before. Small associated partially loculated right pleural  effusion. Slight improvement in left upper lobe consolidation with associated architectural distortion. New peribronchovascular ground-glass in the left lower lobe (4/91), likely infectious or inflammatory in etiology. Subpleural reticular densities and ground-glass in the peripheral left lower lobe, similar. Trace left pleural fluid. Airway is unremarkable. Musculoskeletal: Degenerative changes in the spine. No worrisome lytic or sclerotic lesions. CT ABDOMEN PELVIS FINDINGS Hepatobiliary: Liver is unremarkable. Cholecystectomy. No biliary ductal dilatation. Pancreas: Negative. Spleen: Negative. Adrenals/Urinary Tract: Adrenal glands are unremarkable. 4.6 cm fluid density lesion off the lower pole right kidney is likely a cyst. Other smaller low-attenuation lesions in the kidneys are too small to characterize. There is a subtle 1.8 cm lesion the posterior left kidney (2/73), as on prior exams. No urinary stones. Ureters are decompressed. Bladder is grossly unremarkable. Stomach/Bowel: Stomach is unremarkable. Duodenal diverticula incidentally noted. Small bowel and colon unremarkable. Appendix is questionably visualized. Vascular/Lymphatic: Atherosclerotic calcification of the aorta. No pathologically enlarged lymph nodes. Reproductive: Prostate is visualized. Other: Slight haziness in the anterior left anatomic pelvis (2/100), nonspecific and without a definite source. Mesenteries and peritoneum are otherwise unremarkable. Musculoskeletal: 6.6 x 9.9 cm partially calcified soft tissue mass posterior to the left sacroiliac joint and left sacrum, unchanged and possibly due to prior trauma. Degenerative changes in the spine. No worrisome lytic or sclerotic lesions. IMPRESSION: 1. Post treatment changes in the hemithoraces bilaterally. No evidence of recurrent or metastatic disease. 2. Subtle 1.8 cm lesion off the posterior interpolar left kidney, as on prior exams. Renal cell carcinoma can not be excluded. 3. Small  right pleural effusion. 4.  Aortic atherosclerosis (ICD10-I70.0). Electronically Signed   By: Lorin Picket M.D.   On: 09/10/2021 10:50   CT Chest Wo Contrast  Result Date: 09/10/2021 CLINICAL DATA:  Primary Cancer Type: Lung Imaging Indication: Assess response to therapy Interval therapy since last imaging? Yes Initial Cancer Diagnosis Date: 08/06/2020; Established by: Biopsy-proven Detailed Pathology: Stage IV non-small cell lung cancer, mucinous adenocarcinoma. Primary Tumor location: Left upper lobe, right lower lobe. Surgeries: CABG 2016.  Cholecystectomy.  Multiple back surgeries. Chemotherapy: Yes; Ongoing?  No; Most recent administration: 01/2021 Immunotherapy?  Yes; Type: Keytruda; Ongoing? No Radiation therapy? No Other Cancer Therapies: Lumakras daily. EXAM: CT CHEST, ABDOMEN AND PELVIS WITHOUT CONTRAST TECHNIQUE: Multidetector CT imaging of the chest, abdomen and pelvis was performed following the standard protocol without IV contrast. COMPARISON:  Most recent CT chest, abdomen and pelvis 06/17/2021. 08/22/2020 PET-CT. FINDINGS: CT CHEST FINDINGS Cardiovascular: Right IJ Port-A-Cath terminates in the high right atrium. Atherosclerotic calcification of the aorta and aortic valve. Heart is enlarged. No pericardial effusion. Mediastinum/Nodes: 1.4 cm low-attenuation right thyroid nodule. No follow-up recommended. (Ref: J Am Coll Radiol. 2015 Feb;12(2): 143-50).No pathologically enlarged mediastinal  or axillary lymph nodes. Hilar regions are difficult to evaluate without IV contrast. Esophagus is grossly unremarkable. Lungs/Pleura: Post treatment parenchymal retraction, bronchiectasis and volume loss in the posteromedial mid and lower right hemithorax, as before. Small associated partially loculated right pleural effusion. Slight improvement in left upper lobe consolidation with associated architectural distortion. New peribronchovascular ground-glass in the left lower lobe (4/91), likely infectious or  inflammatory in etiology. Subpleural reticular densities and ground-glass in the peripheral left lower lobe, similar. Trace left pleural fluid. Airway is unremarkable. Musculoskeletal: Degenerative changes in the spine. No worrisome lytic or sclerotic lesions. CT ABDOMEN PELVIS FINDINGS Hepatobiliary: Liver is unremarkable. Cholecystectomy. No biliary ductal dilatation. Pancreas: Negative. Spleen: Negative. Adrenals/Urinary Tract: Adrenal glands are unremarkable. 4.6 cm fluid density lesion off the lower pole right kidney is likely a cyst. Other smaller low-attenuation lesions in the kidneys are too small to characterize. There is a subtle 1.8 cm lesion the posterior left kidney (2/73), as on prior exams. No urinary stones. Ureters are decompressed. Bladder is grossly unremarkable. Stomach/Bowel: Stomach is unremarkable. Duodenal diverticula incidentally noted. Small bowel and colon unremarkable. Appendix is questionably visualized. Vascular/Lymphatic: Atherosclerotic calcification of the aorta. No pathologically enlarged lymph nodes. Reproductive: Prostate is visualized. Other: Slight haziness in the anterior left anatomic pelvis (2/100), nonspecific and without a definite source. Mesenteries and peritoneum are otherwise unremarkable. Musculoskeletal: 6.6 x 9.9 cm partially calcified soft tissue mass posterior to the left sacroiliac joint and left sacrum, unchanged and possibly due to prior trauma. Degenerative changes in the spine. No worrisome lytic or sclerotic lesions. IMPRESSION: 1. Post treatment changes in the hemithoraces bilaterally. No evidence of recurrent or metastatic disease. 2. Subtle 1.8 cm lesion off the posterior interpolar left kidney, as on prior exams. Renal cell carcinoma can not be excluded. 3. Small right pleural effusion. 4.  Aortic atherosclerosis (ICD10-I70.0). Electronically Signed   By: Lorin Picket M.D.   On: 09/10/2021 10:50   DG Chest Port 1 View  Result Date:  09/30/2021 CLINICAL DATA:  Dyspnea EXAM: PORTABLE CHEST 1 VIEW COMPARISON:  09/28/2021, 09/26/2021, CT 09/09/2001 FINDINGS: Right-sided central venous port tip over the cavoatrial region. Cardiomegaly with vascular congestion. Suspected bilateral pleural effusions. Similar appearance of bilateral ground-glass opacities and patchy consolidations. No visible pneumothorax. IMPRESSION: Cardiomegaly with vascular congestion and suspected pleural effusions. Similar appearing bilateral ground-glass disease and patchy areas of consolidation Electronically Signed   By: Donavan Foil M.D.   On: 09/30/2021 16:32   DG CHEST PORT 1 VIEW  Result Date: 09/28/2021 CLINICAL DATA:  Dyspnea.  History of CHF EXAM: PORTABLE CHEST 1 VIEW COMPARISON:  09/28/2021 FINDINGS: Right-sided chest port remains in place. Cardiomegaly. Prior median sternotomy. Bilateral patchy airspace opacities persist, overall similar to the previous radiograph. Small right pleural effusion. No pneumothorax. IMPRESSION: Bilateral patchy airspace opacities persist, overall similar to previous radiograph. Electronically Signed   By: Davina Poke D.O.   On: 09/28/2021 15:28   DG CHEST PORT 1 VIEW  Result Date: 09/28/2021 CLINICAL DATA:  Shortness of breath EXAM: PORTABLE CHEST 1 VIEW COMPARISON:  Chest x-ray dated 09/26/2021 FINDINGS: Increased bilateral perihilar and bibasilar opacities, some components suggesting dense consolidation and some components of ground-glass opacity. Probable small RIGHT pleural effusion. No pneumothorax is seen. Stable cardiomegaly. RIGHT chest wall Port-A-Cath is stable in position. Median sternotomy wires in place. IMPRESSION: 1. Increased bilateral perihilar and bibasilar opacities, including some components of ground-glass opacity, compatible with multifocal pneumonia versus pulmonary edema. 2. Probable small RIGHT pleural effusion. Electronically Signed  By: Franki Cabot M.D.   On: 09/28/2021 07:33   DG Chest  Port 1 View  Result Date: 09/26/2021 CLINICAL DATA:  Respiratory distress. EXAM: PORTABLE CHEST 1 VIEW COMPARISON:  May 01, 2021 FINDINGS: There is stable right-sided venous Port-A-Cath positioning. Multiple sternal wires are seen. Mild, diffuse, chronic appearing increased interstitial lung markings are seen. Mild, stable areas of atelectasis are seen within the bilateral lung bases and mid left lung. Stable elevation of the right hemidiaphragm is seen. There is no evidence of a pleural effusion or pneumothorax. The cardiac silhouette is enlarged and unchanged in size. A radiopaque fusion plate and screws are seen overlying the cervical spine. IMPRESSION: Chronic appearing increased interstitial lung markings with mild, stable bibasilar and mid left lung atelectasis. Electronically Signed   By: Virgina Norfolk M.D.   On: 09/26/2021 23:11   ECHOCARDIOGRAM COMPLETE  Result Date: 09/29/2021    ECHOCARDIOGRAM REPORT   Patient Name:   Randall Wilkins Date of Exam: 09/29/2021 Medical Rec #:  [id number]      Height:       67.0 in Accession #:    [id number]     Weight:       214.9 lb Date of Birth:  12-25-1940      BSA:          2.085 m Patient Age:    43 years       BP:           120/58 mmHg Patient Gender: M              HR:           69 bpm. Exam Location:  Inpatient Procedure: 2D Echo, Color Doppler and Cardiac Doppler Indications:    X10.62 Acute systolic (congestive) heart failure  History:        Patient has prior history of Echocardiogram examinations, most                 recent 05/06/2021. CHF, Prior CABG; Risk Factors:Hypertension,                 Diabetes and Dyslipidemia.  Sonographer:    Raquel Sarna Senior RDCS Referring Phys: [place]  1. Left ventricular ejection fraction, by estimation, is 45 to 50%. The left ventricle has mildly decreased function. The left ventricle demonstrates global hypokinesis. There is mild left ventricular hypertrophy. Left ventricular diastolic parameters are  consistent with Grade III diastolic dysfunction (restrictive). There is the interventricular septum is flattened in systole and diastole, consistent with right ventricular pressure and volume overload.  2. Right ventricular systolic function is normal. The right ventricular size is normal. There is moderately elevated pulmonary artery systolic pressure. The estimated right ventricular systolic pressure is 69.4 mmHg.  3. The mitral valve is normal in structure. Mild mitral valve regurgitation. No evidence of mitral stenosis.  4. Tricuspid valve regurgitation is severe.  5. The aortic valve is calcified. There is severe calcifcation of the aortic valve. There is severe thickening of the aortic valve. Aortic valve regurgitation is trivial. Moderate aortic valve stenosis. Aortic valve mean gradient measures 21.0 mmHg. Aortic valve Vmax measures 3.53 m/s.  6. The inferior vena cava is normal in size with greater than 50% respiratory variability, suggesting right atrial pressure of 3 mmHg. Comparison(s): Prior mean aortic valve gradient 17.5 mmHg. FINDINGS  Left Ventricle: Left ventricular ejection fraction, by estimation, is 45 to 50%. The left ventricle has mildly decreased function. The left ventricle  demonstrates global hypokinesis. The left ventricular internal cavity size was normal in size. There is  mild left ventricular hypertrophy. The interventricular septum is flattened in systole and diastole, consistent with right ventricular pressure and volume overload. Left ventricular diastolic parameters are consistent with Grade III diastolic dysfunction (restrictive). Right Ventricle: The right ventricular size is normal. No increase in right ventricular wall thickness. Right ventricular systolic function is normal. There is moderately elevated pulmonary artery systolic pressure. The tricuspid regurgitant velocity is 3.28 m/s, and with an assumed right atrial pressure of 8 mmHg, the estimated right ventricular  systolic pressure is 70.7 mmHg. Left Atrium: Left atrial size was normal in size. Right Atrium: Right atrial size was normal in size. Pericardium: There is no evidence of pericardial effusion. Mitral Valve: The mitral valve is normal in structure. Mild mitral valve regurgitation. No evidence of mitral valve stenosis. MV peak gradient, 8.4 mmHg. The mean mitral valve gradient is 3.0 mmHg. Tricuspid Valve: The tricuspid valve is normal in structure. Tricuspid valve regurgitation is severe. No evidence of tricuspid stenosis. Aortic Valve: The aortic valve is calcified. There is severe calcifcation of the aortic valve. There is severe thickening of the aortic valve. Aortic valve regurgitation is trivial. Moderate aortic stenosis is present. Aortic valve mean gradient measures  21.0 mmHg. Aortic valve peak gradient measures 49.8 mmHg. Aortic valve area, by VTI measures 0.70 cm. Pulmonic Valve: The pulmonic valve was normal in structure. Pulmonic valve regurgitation is not visualized. No evidence of pulmonic stenosis. Aorta: The aortic root is normal in size and structure. Venous: The inferior vena cava is normal in size with greater than 50% respiratory variability, suggesting right atrial pressure of 3 mmHg. IAS/Shunts: No atrial level shunt detected by color flow Doppler.  LEFT VENTRICLE PLAX 2D LVIDd:         4.60 cm   Diastology LVIDs:         3.70 cm   LV e' medial:    9.11 cm/s LV PW:         1.30 cm   LV E/e' medial:  14.3 LV IVS:        1.20 cm   LV e' lateral:   11.30 cm/s LVOT diam:     1.90 cm   LV E/e' lateral: 11.5 LV SV:         54 LV SV Index:   26 LVOT Area:     2.84 cm  RIGHT VENTRICLE RV S prime:     10.40 cm/s TAPSE (M-mode): 1.6 cm LEFT ATRIUM             Index        RIGHT ATRIUM           Index LA diam:        3.80 cm 1.82 cm/m   RA Area:     22.40 cm LA Vol (A2C):   62.4 ml 29.93 ml/m  RA Volume:   68.40 ml  32.80 ml/m LA Vol (A4C):   49.3 ml 23.64 ml/m LA Biplane Vol: 55.5 ml 26.62 ml/m   AORTIC VALVE AV Area (Vmax):    0.80 cm AV Area (Vmean):   1.06 cm AV Area (VTI):     0.70 cm AV Vmax:           353.00 cm/s AV Vmean:          198.000 cm/s AV VTI:            0.765 m AV Peak Grad:  49.8 mmHg AV Mean Grad:      21.0 mmHg LVOT Vmax:         99.80 cm/s LVOT Vmean:        73.900 cm/s LVOT VTI:          0.190 m LVOT/AV VTI ratio: 0.25  AORTA Ao Root diam: 3.10 cm Ao Asc diam:  3.10 cm MITRAL VALVE                TRICUSPID VALVE MV Area (PHT): 5.02 cm     TR Peak grad:   43.0 mmHg MV Area VTI:   1.78 cm     TR Vmax:        328.00 cm/s MV Peak grad:  8.4 mmHg MV Mean grad:  3.0 mmHg     SHUNTS MV Vmax:       1.45 m/s     Systemic VTI:  0.19 m MV Vmean:      87.3 cm/s    Systemic Diam: 1.90 cm MV Decel Time: 151 msec MV E velocity: 130.00 cm/s MV A velocity: 94.90 cm/s MV E/A ratio:  1.37 Candee Furbish MD Electronically signed by Candee Furbish MD Signature Date/Time: 09/29/2021/5:46:56 PM    Final       LAB RESULTS: Basic Metabolic Panel: Recent Labs  Lab 10/01/21 0529 10/04/21 0500  NA 133* 141  K 4.1 3.3*  CL 101 106  CO2 23 27  GLUCOSE 223* 155*  BUN 48* 31*  CREATININE 1.93* 1.43*  CALCIUM 8.2* 8.4*  MG 2.2  --   PHOS 4.5  --    Liver Function Tests: Recent Labs  Lab 09/30/21 0515 10/01/21 0529  ALBUMIN 2.2* 2.2*   No results for input(s): LIPASE, AMYLASE in the last 168 hours. No results for input(s): AMMONIA in the last 168 hours. CBC: Recent Labs  Lab 09/30/21 0515 10/01/21 0529  WBC 7.2  --   HGB 8.9* 8.8*  HCT 29.9* 29.6*  MCV 95.2  --   PLT 110*  --    Cardiac Enzymes: No results for input(s): CKTOTAL, CKMB, CKMBINDEX, TROPONINI in the last 168 hours. BNP: Invalid input(s): POCBNP CBG: Recent Labs  Lab 10/04/21 0512 10/06/21 1136  GLUCAP 152* 308*       Disposition and Follow-up: Discharge Instructions     Diet - low sodium heart healthy   Complete by: As directed    Increase activity slowly   Complete by: As directed          DISPOSITION: Home with hospice 35 minutes   [place]     [name], MD Follow up.   Specialty: Family Medicine Why: as needed Contact information: [address] [place] [address] [phone]         Pixie Casino, MD .   Specialty: Cardiology Contact information: [place]  [address] [phone]                  Time coordinating discharge:  35 mins   Signed:   Estill Cotta M.D. [facility] 10/06/2021, 1:20 PM

## 2021-10-06 NOTE — TOC Transition Note (Signed)
Transition of Care [facility]) - CM/SW Discharge Note   Patient Details  Name: Randall Wilkins MRN: [id number] Date of Birth: [birth year]  [facility]) CM/SW Contact:  Zenon Mayo, RN Phone Number: 10/06/2021, 1:23 PM   Clinical Narrative:    Patient is for dc to home with hospice today with [facility], per Olivia Mackie she states that they have Delivered the DME, hosp bed, BSC, home oxygen , cpap with o2 adapter, bedside table.  Olivia Mackie has called [facility] for transport, ambulance forms at the desk on unit.    Final next level of care: Home w Hospice Care Barriers to Discharge: No Barriers Identified   Patient Goals and CMS Choice Patient states their goals for this hospitalization and ongoing recovery are:: return home with hospice CMS Medicare.gov Compare Post Acute Care list provided to:: Patient Represenative (must comment) Choice offered to / list presented to : Spouse  Discharge Placement                       Discharge Plan and Services   Discharge Planning Services: CM Consult                      HH Arranged: RN [place] Agency:  [facility]) Date HH Agency Contacted: 10/06/21 Time [place]: [phone] Representative spoke with at [place]: [place] ([place]) Interventions     Readmission Risk Interventions Readmission Risk Prevention Plan 10/06/2021 03/03/2021  Transportation Screening Complete -  PCP or Specialist Appt within 3-5 Days - Complete  HRI or [place] - Complete  Social Work Consult for [place] Planning/Counseling - Complete  Palliative Care Screening - Not Applicable  Medication Review Press photographer) Complete Complete  PCP or Specialist appointment within 3-5 days of discharge Complete -  [place] or Home Care Consult Complete -  SW Recovery Care/Counseling Consult Complete -  Palliative Care Screening Complete -  [place] Not Applicable -  Some recent data might be  hidden

## 2021-10-06 NOTE — Care Management Important Message (Signed)
Important Message  Patient Details  Name: Randall Wilkins MRN: [id number] Date of Birth: 03-28-1941   Medicare Important Message Given:  Yes     [name] 10/06/2021, 10:31 AM

## 2021-10-06 NOTE — Progress Notes (Signed)
[place] [facility]) Hospital Liaison note  Met with son and patient at bedside. Patient without complaints, ready to discharge home.  All DME has been delivered to the home. CPAP settings auto mode, 10-18 cm H20 and 5 lpm O2.  [facility] has been called for transport. They said earliest pick up would be 2pm.  Please send signed and completed DNR with patient.  Please provide prescriptions for medications at discharge to ensure comfort until patient can be admitted onto hospice services.  Please call with any hospice related questions or concerns.  Thank you, Margaretmary Eddy, BSN, RN [facility] Liaison [phone]

## 2021-10-06 NOTE — TOC Initial Note (Signed)
Transition of Care [facility]) - Initial/Assessment Note    Patient Details  Name: Randall Wilkins MRN: [id number] Date of Birth: 1941-05-12  Transition of Care [facility]) CM/SW Contact:    Zenon Mayo, RN Phone Number: 10/06/2021, 1:20 PM  Clinical Narrative:                 Patient is for dc to home with hospice today with [facility], per Olivia Mackie she states that they have Delivered the DME, hosp bed, BSC, home oxygen , cpap with o2 adapter, bedside table.  Olivia Mackie has called [facility] for transport, ambulance forms at the desk on unit.  Expected Discharge Plan: Home w Hospice Care Barriers to Discharge: No Barriers Identified   Patient Goals and CMS Choice Patient states their goals for this hospitalization and ongoing recovery are:: return home with hospice CMS Medicare.gov Compare Post Acute Care list provided to:: Patient Represenative (must comment) Choice offered to / list presented to : Spouse  Expected Discharge Plan and Services Expected Discharge Plan: [place]   Discharge Planning Services: CM Consult   Living arrangements for the past 2 months: Single Family Home Expected Discharge Date: 10/06/21                         HH Arranged: RN HH Agency:  [facility]) Date HH Agency Contacted: 10/06/21 Time HH Agency Contacted: 73 Representative spoke with at [place]: [place] Arrangements/Services Living arrangements for the past 2 months: [place] Lives with:: Spouse Patient language and need for interpreter reviewed:: Yes Do you feel safe going back to the place where you live?: Yes      Need for Family Participation in Patient Care: Yes (Comment) Care giver support system in place?: Yes (comment)   Criminal Activity/Legal Involvement Pertinent to Current Situation/Hospitalization: No - Comment as needed  Activities of Daily Living Home Assistive Devices/Equipment: None ADL Screening (condition at time of admission) Patient's  cognitive ability adequate to safely complete daily activities?: Yes Is the patient deaf or have difficulty hearing?: Yes Does the patient have difficulty seeing, even when wearing glasses/contacts?: No Does the patient have difficulty concentrating, remembering, or making decisions?: Yes Patient able to express need for assistance with ADLs?: Yes Does the patient have difficulty dressing or bathing?: No Independently performs ADLs?: Yes (appropriate for developmental age) Does the patient have difficulty walking or climbing stairs?: Yes Weakness of Legs: Both Weakness of Arms/Hands: None  Permission Sought/Granted                  Emotional Assessment Appearance:: Appears stated age Attitude/Demeanor/Rapport: Engaged Affect (typically observed): Appropriate Orientation: : Oriented to Self, Oriented to Place, Oriented to  Time, Oriented to Situation Alcohol / Substance Use: Not Applicable Psych Involvement: No (comment)  Admission diagnosis:  Acute respiratory failure with hypoxia (HCC) [J96.01] Elevated troponin I level [R77.8] Acute on chronic combined systolic and diastolic CHF, NYHA class 3 ([place]) [I50.43] Patient Active Problem List   Diagnosis Date Noted   Atrial fibrillation and flutter ([place]) 09/27/2021   Acute respiratory failure with hypoxia ([place]) 09/27/2021   Acute on chronic combined systolic and diastolic CHF, NYHA class 3 ([place]) 09/27/2021   Typical atrial flutter ([place]) 05/04/2021   CHF (congestive heart failure) ([place]) 05/02/2021   Acute on chronic combined systolic and diastolic CHF (congestive heart failure) ([place])    Cystoid macular degeneration of right eye 04/02/2021   Symptomatic bradycardia 02/26/2021  Port-A-Cath in place 02/03/2021   Macular pucker, left eye 01/29/2021   Memory loss 12/23/2020   Cerebrovascular accident (CVA) ([place]) 12/23/2020   ARF (acute renal failure) ([place]) 10/02/2020   Adenocarcinoma of right lung, stage 4 ([place]) 09/02/2020    Encounter for antineoplastic immunotherapy 09/02/2020   Goals of care, counseling/discussion 08/20/2020   Encounter for antineoplastic chemotherapy 08/20/2020   Mediastinal lymphadenopathy 08/06/2020   Lung mass 06/05/2020   Essential hypertension 03/23/2018   Mild cognitive impairment 12/24/2017   Gait abnormality 12/24/2017   Murmur, cardiac 03/04/2017   History of stroke 12/12/2015   Gait disorder 12/12/2015   Coronary artery disease involving native coronary artery of native heart without angina pectoris    S/P CABG x 2 03/29/2015   Aortic valve sclerosis 03/29/2015   Peptic ulcer with hemorrhage 03/28/2015   Left main coronary artery disease 03/28/2015   Chronic diastolic congestive heart failure (HCC)    Chronic kidney disease (CKD), stage III (moderate) (HCC)    Dysphagia    Chronic respiratory failure with hypoxia (HCC) - 2 L/min 03/25/2015   NSTEMI (non-ST elevated myocardial infarction) ([place]) 03/25/2015   Shortness of breath    Subendocardial MI subsequent episode care ([place]) 03/24/2015   SOB (shortness of breath)    Confusion 03/21/2015   Weakness generalized 03/21/2015   Increased urinary frequency 03/21/2015   Hyperglycemia 03/21/2015   Thrombocytopenia ([place]) 03/21/2015   Hematoma of abdominal wall 03/21/2015   Obstructive sleep apnea 70/62/3762   Mild diastolic dysfunction 83/15/1761   Dyspnea on exertion 03/21/2015   Neuropathy 09/11/2014   Bilateral leg edema 05/21/2014   Diabetic peripheral neuropathy associated with type 1 diabetes mellitus ([place]) 11/14/2013   Type 1 diabetes mellitus with nephropathy ([place]) 11/14/2013   Aortic valve stenosis 11/14/2013   Obesity (BMI 30-39.9) 11/14/2013   Asymptomatic PVCs 11/14/2013   Memory impairment 02/04/2013   Hyperlipidemia 02/04/2013   Diabetes 1.5, managed as type 1 ([place]) 02/04/2013   Cerebral infarction ([place]) 02/04/2013   CAD S/P percutaneous coronary angioplasty 11/14/2004   PCP:  [name], MD Pharmacy:    [place], [place] - [address] [place] [address] Phone: [phone] Fax: [phone]  [place], [place] - [address] [place] [address] [place] [address] Phone: [phone] Fax: [place], [place] [address] [place] [address] Phone: [phone] Fax: [phone]     Social Determinants of Health ([place]) Interventions    Readmission Risk Interventions Readmission Risk Prevention Plan 10/06/2021 03/03/2021  Transportation Screening Complete -  PCP or Specialist Appt within 3-5 Days - Complete  HRI or [place] - Complete  Social Work Consult for [place] Planning/Counseling - Complete  Palliative Care Screening - Not Applicable  Medication Review Press photographer) Complete Complete  PCP or Specialist appointment within 3-5 days of discharge Complete -  [place] or Home Care Consult Complete -  SW Recovery Care/Counseling Consult Complete -  Palliative Care Screening Complete -  [place] Not Applicable -  Some recent data might be hidden

## 2021-10-07 ENCOUNTER — Ambulatory Visit (HOSPITAL_COMMUNITY): Payer: Medicare Other | Admitting: Nurse Practitioner

## 2021-10-07 MED ORDER — HEPARIN SOD (PORK) LOCK FLUSH 100 UNIT/ML IV SOLN
500.0000 [IU] | INTRAVENOUS | Status: AC | PRN
  Administered 2021-10-07: 500 [IU]
  Filled 2021-10-07: qty 5

## 2021-10-08 ENCOUNTER — Other Ambulatory Visit: Payer: Medicare Other

## 2021-10-08 ENCOUNTER — Ambulatory Visit: Payer: Medicare Other | Admitting: Internal Medicine

## 2021-10-10 ENCOUNTER — Telehealth: Payer: Self-pay | Admitting: Endocrinology

## 2021-10-10 NOTE — Telephone Encounter (Signed)
Randall Wilkins's Hospice Nurse requests to be called at ph# [phone] re: Patient's medication issue while on Hospice

## 2021-10-10 NOTE — Telephone Encounter (Signed)
Called and spoke with hospice nurse and pt's sugars for the last 3 days have been 201, 242, 258 every morning. Pt was taken off insulin when discharged from hospital Monday. Wife and nurse wanting to know if they should have pt start back taking insulin (6 units with supper) as previously prescribed.

## 2021-10-13 NOTE — Telephone Encounter (Signed)
Called and advised pt's wife to resume [place] 6 units daily.

## 2021-10-30 ENCOUNTER — Other Ambulatory Visit (HOSPITAL_COMMUNITY): Payer: Self-pay

## 2021-10-30 ENCOUNTER — Telehealth: Payer: Self-pay

## 2021-10-30 NOTE — Telephone Encounter (Signed)
Patient Advocate Encounter   Received notification from [facility] that prior authorization for Randall Wilkins is required by his/her insurance.   PA submitted on 10/30/21 Key#: [id number]  Status is pending    [facility] will continue to follow:  Patient Advocate Fax:  [phone]

## 2021-10-31 ENCOUNTER — Other Ambulatory Visit (HOSPITAL_COMMUNITY): Payer: Self-pay

## 2021-11-03 ENCOUNTER — Other Ambulatory Visit (HOSPITAL_COMMUNITY): Payer: Self-pay

## 2021-11-03 NOTE — Telephone Encounter (Signed)
PA cancelled for [place]

## 2021-11-03 NOTE — Telephone Encounter (Signed)
Left voice mail for Hospice Nurse, Almyra Free to try to find out what's going on. Per test claim, it appears the pt gets the [place] through patient assistance. The only note of it is 2 boxes he got back in May.

## 2021-11-11 NOTE — Progress Notes (Deleted)
Office Visit Note  Patient: Randall Wilkins             Date of Birth: 31-May-1941           MRN: [id number]             PCP: [name], MD Referring: [name], MD Visit Date: 11/25/2021 Occupation: @GUAROCC @  Subjective:  No chief complaint on file.   History of Present Illness: Randall Wilkins is a 80 y.o. male ***   Activities of Daily Living:  Patient reports morning stiffness for *** {minute/hour:19697}.   Patient {ACTIONS;DENIES/REPORTS:21021675::"Denies"} nocturnal pain.  Difficulty dressing/grooming: {ACTIONS;DENIES/REPORTS:21021675::"Denies"} Difficulty climbing stairs: {ACTIONS;DENIES/REPORTS:21021675::"Denies"} Difficulty getting out of chair: {ACTIONS;DENIES/REPORTS:21021675::"Denies"} Difficulty using hands for taps, buttons, cutlery, and/or writing: {ACTIONS;DENIES/REPORTS:21021675::"Denies"}  No Rheumatology ROS completed.   PMFS History:  Patient Active Problem List   Diagnosis Date Noted   Atrial fibrillation and flutter ([place]) 09/27/2021   Acute respiratory failure with hypoxia ([place]) 09/27/2021   Acute on chronic combined systolic and diastolic CHF, NYHA class 3 ([place]) 09/27/2021   Typical atrial flutter ([place]) 05/04/2021   CHF (congestive heart failure) ([place]) 05/02/2021   Acute on chronic combined systolic and diastolic CHF (congestive heart failure) ([place])    Cystoid macular degeneration of right eye 04/02/2021   Symptomatic bradycardia 02/26/2021   Port-A-Cath in place 02/03/2021   Macular pucker, left eye 01/29/2021   Memory loss 12/23/2020   Cerebrovascular accident (CVA) ([place]) 12/23/2020   ARF (acute renal failure) ([place]) 10/02/2020   Adenocarcinoma of right lung, stage 4 ([place]) 09/02/2020   Encounter for antineoplastic immunotherapy 09/02/2020   Goals of care, counseling/discussion 08/20/2020   Encounter for antineoplastic chemotherapy 08/20/2020   Mediastinal lymphadenopathy 08/06/2020   Lung mass 06/05/2020   Essential hypertension  03/23/2018   Mild cognitive impairment 12/24/2017   Gait abnormality 12/24/2017   Murmur, cardiac 03/04/2017   History of stroke 12/12/2015   Gait disorder 12/12/2015   Coronary artery disease involving native coronary artery of native heart without angina pectoris    S/P CABG x 2 03/29/2015   Aortic valve sclerosis 03/29/2015   Peptic ulcer with hemorrhage 03/28/2015   Left main coronary artery disease 03/28/2015   Chronic diastolic congestive heart failure (HCC)    Chronic kidney disease (CKD), stage III (moderate) (HCC)    Dysphagia    Chronic respiratory failure with hypoxia (HCC) - 2 L/min 03/25/2015   NSTEMI (non-ST elevated myocardial infarction) ([place]) 03/25/2015   Shortness of breath    Subendocardial MI subsequent episode care ([place]) 03/24/2015   SOB (shortness of breath)    Confusion 03/21/2015   Weakness generalized 03/21/2015   Increased urinary frequency 03/21/2015   Hyperglycemia 03/21/2015   Thrombocytopenia ([place]) 03/21/2015   Hematoma of abdominal wall 03/21/2015   Obstructive sleep apnea 94/49/6759   Mild diastolic dysfunction 16/38/4665   Dyspnea on exertion 03/21/2015   Neuropathy 09/11/2014   Bilateral leg edema 05/21/2014   Diabetic peripheral neuropathy associated with type 1 diabetes mellitus ([place]) 11/14/2013   Type 1 diabetes mellitus with nephropathy ([place]) 11/14/2013   Aortic valve stenosis 11/14/2013   Obesity (BMI 30-39.9) 11/14/2013   Asymptomatic PVCs 11/14/2013   Memory impairment 02/04/2013   Hyperlipidemia 02/04/2013   Diabetes 1.5, managed as type 1 ([place]) 02/04/2013   Cerebral infarction ([place]) 02/04/2013   CAD S/P percutaneous coronary angioplasty 11/14/2004    Past Medical History:  Diagnosis Date   Aortic stenosis, mild 11/14/2013   Aortic valve sclerosis 03/29/2015   Bilateral leg edema  05/21/2014   CAD (coronary artery disease)    Chronic diastolic congestive heart failure (HCC)    Chronic kidney disease (CKD), stage III (moderate) (HCC)     Diabetes 1.5, managed as type 1 ([place]) 02/04/2013   Diabetic peripheral neuropathy associated with type 1 diabetes mellitus ([place]) 11/14/2013   Dysphagia    Dyspnea on exertion 03/21/2015   Exogenous obesity    Gout    Heart attack ([place])    History of nuclear stress test 07/2011   dipyridamole; fixed inferolateral defect, worse at stress than rest; no reversible ischemia; low risk scan    Hyperlipidemia    Hypertension    Hypothyroidism    Insulin dependent diabetes mellitus    Left foot drop    Left main coronary artery disease 03/28/2015   lung ca dx'd 08/2020   Macular pucker, right eye 01/29/2021   Vitrectomy membrane peel right eye 02-12-2021   Memory loss    Obesity (BMI 30-39.9) 11/14/2013   Obstructive sleep apnea 03/21/2015   OSA on CPAP    uses a cpap   Peptic ulcer with hemorrhage 03/28/2015   Peripheral neuropathy    Pneumonia    Rhabdomyolysis    S/P CABG x 2 03/29/2015   LIMA to Diagonal, SVG to OM, EVH via right thigh   Stroke ([place])    L patietal with small scattered lacunar infarcts   Thrombocytopenia ([place]) 03/21/2015   Venous insufficiency    Weakness generalized 03/21/2015    Family History  Problem Relation Age of Onset   Heart disease Mother    Coronary artery disease Father    Cancer Maternal Grandmother    Heart Problems Maternal Grandfather    Diabetes Son        borderline    Past Surgical History:  Procedure Laterality Date   BACK SURGERY  2002   lumbosacral. 11 back surgeries total   BRONCHIAL BIOPSY  08/06/2020   Procedure: BRONCHIAL BIOPSIES;  Surgeon: Collene Gobble, MD;  Location: [facility];  Service: Pulmonary;;   BRONCHIAL BRUSHINGS  08/06/2020   Procedure: BRONCHIAL BRUSHINGS;  Surgeon: Collene Gobble, MD;  Location: [facility];  Service: Pulmonary;;   BRONCHIAL NEEDLE ASPIRATION BIOPSY  08/06/2020   Procedure: BRONCHIAL NEEDLE ASPIRATION BIOPSIES;  Surgeon: Collene Gobble, MD;  Location: [facility];  Service: Pulmonary;;   CARDIOVERSION  N/A 05/06/2021   Procedure: CARDIOVERSION;  Surgeon: Geralynn Rile, MD;  Location: [facility];  Service: Cardiovascular;  Laterality: N/A;   Carotid Doppler  03/2013   bilat bulb/prox ICAs - mild amount of fibrous plaque with no evidence of diameter reduction   CARPAL TUNNEL RELEASE Bilateral 08/09/2014   Procedure: BILATERAL CARPAL TUNNEL RELEASE;  Surgeon: Daryll Brod, MD;  Location: [facility];  Service: Orthopedics;  Laterality: Bilateral;  ANESTHESIA:  IV REGIONAL BIL FAB   CHOLECYSTECTOMY     COLONOSCOPY     CORONARY ANGIOPLASTY  10/13/1996   CORONARY ANGIOPLASTY  09/21/1989   emergency PTCA   CORONARY ANGIOPLASTY  10/13/1996   Multi-Link diagonal & OD stenting (Dr. Marella Chimes)   [place]  12/03/1997   disease of mid DX-1 ~50% & in mid PLA & PDA (distal lesions) (Dr. Marella Chimes)    CORONARY ANGIOPLASTY  10/14/1999   progression of disease distal PLA & PDA; progression of disease prox RCA - moderate (Dr. Marella Chimes)    CORONARY ANGIOPLASTY WITH STENT PLACEMENT  04/04/2004   4.0x55mm non-DES (thrombectomy via AngioJet) to RCA for  high grade stenosis (Dr. Marella Chimes)   CORONARY ARTERY BYPASS GRAFT N/A 03/29/2015   Procedure: CORONARY ARTERY BYPASS GRAFTING TIMES TWO USING LEFT INTERNAL MAMMARY ARTERY AND RIGHT LEG GREATER SAPHENOUS VEIN HARVESTED ENDOSCOPICALLY.;  Surgeon: Rexene Alberts, MD;  Location: [facility];  Service: Open Heart Surgery;  Laterality: N/A;   ESOPHAGOGASTRODUODENOSCOPY N/A 03/27/2015   Procedure: ESOPHAGOGASTRODUODENOSCOPY (EGD);  Surgeon: Clarene Essex, MD;  Location[facility];  Service: Endoscopy;  Laterality: N/A;  possible dilation   ESOPHAGOGASTRODUODENOSCOPY N/A 10/05/2020   Procedure: ESOPHAGOGASTRODUODENOSCOPY (EGD);  Surgeon: Arta Silence, MD;  Location: [facility];  Service: Endoscopy;  Laterality: N/A;   EYE SURGERY Right    macular fold-02/2021   FINE NEEDLE ASPIRATION  08/06/2020   Procedure: FINE NEEDLE  ASPIRATION (FNA) LINEAR;  Surgeon: Collene Gobble, MD;  Location: [facility];  Service: Pulmonary;;   IR IMAGING GUIDED PORT INSERTION  10/07/2020   LEFT HEART CATHETERIZATION WITH CORONARY ANGIOGRAM N/A 03/28/2015   Procedure: LEFT HEART CATHETERIZATION WITH CORONARY ANGIOGRAM;  Surgeon: Peter M Martinique, MD;  Location: [facility];  Service: Cardiovascular;  Laterality: N/A;   SINUS ENDO W/FUSION     TEE WITHOUT CARDIOVERSION N/A 05/06/2021   Procedure: TRANSESOPHAGEAL ECHOCARDIOGRAM (TEE);  Surgeon: Geralynn Rile, MD;  Location: [facility];  Service: Cardiovascular;  Laterality: N/A;   TONSILLECTOMY     TRANSTHORACIC ECHOCARDIOGRAM  08/08/2013   EF 55-60%, mild conc hypertrophy, grade 1 diastolic dysfunction; AV with mild stenosis; LA & RA mildly dilated   VIDEO BRONCHOSCOPY WITH ENDOBRONCHIAL NAVIGATION N/A 08/06/2020   Procedure: VIDEO BRONCHOSCOPY WITH ENDOBRONCHIAL NAVIGATION;  Surgeon: Collene Gobble, MD;  Location: [facility];  Service: Pulmonary;  Laterality: N/A;   VIDEO BRONCHOSCOPY WITH ENDOBRONCHIAL ULTRASOUND N/A 08/06/2020   Procedure: VIDEO BRONCHOSCOPY WITH ENDOBRONCHIAL ULTRASOUND;  Surgeon: Collene Gobble, MD;  Location: [facility];  Service: Pulmonary;  Laterality: N/A;   Social History   Social History Narrative   Lives with wife and son   Right handed    Caffeine: maybe 1 cup/day   Immunization History  Administered Date(s) Administered   Influenza, High Dose Seasonal PF 09/06/2014, 09/06/2015, 09/18/2016, 09/06/2017, 08/31/2018, 08/28/2019, 09/07/2020   Influenza,inj,Quad PF,6+ Mos 11/14/2013   Influenza-Unspecified 08/29/2018   PFIZER(Purple Top)SARS-COV-2 Vaccination 01/26/2020, 02/26/2020, 08/27/2020     Objective: Vital Signs: There were no vitals taken for this visit.   Physical Exam   Musculoskeletal Exam: ***  CDAI Exam: CDAI Score: -- Patient Global: --; Provider Global: -- Swollen: --; Tender: -- Joint Exam 11/25/2021   No  joint exam has been documented for this visit   There is currently no information documented on the homunculus. Go to the Rheumatology activity and complete the homunculus joint exam.  Investigation: No additional findings.  Imaging: No results found.  Recent Labs: Lab Results  Component Value Date   WBC 7.2 09/30/2021   HGB 8.8 (L) 10/01/2021   PLT 110 (L) 09/30/2021   NA 141 10/04/2021   K 3.3 (L) 10/04/2021   CL 106 10/04/2021   CO2 27 10/04/2021   GLUCOSE 155 (H) 10/04/2021   BUN 31 (H) 10/04/2021   CREATININE 1.43 (H) 10/04/2021   BILITOT 0.7 09/27/2021   ALKPHOS 89 09/27/2021   AST 15 09/27/2021   ALT 8 09/27/2021   PROT 5.6 (L) 09/27/2021   ALBUMIN 2.2 (L) 10/01/2021   CALCIUM 8.4 (L) 10/04/2021   GFRAA >60 09/16/2020    Speciality Comments: No specialty comments available.  Procedures:  No procedures  performed Allergies: Actos [pioglitazone], Spironolactone, Metformin and related, Niaspan [niacin er], and Wound dressing adhesive   Assessment / Plan:     Visit Diagnoses: No diagnosis found.  Orders: No orders of the defined types were placed in this encounter.  No orders of the defined types were placed in this encounter.   Face-to-face time spent with patient was *** minutes. Greater than 50% of time was spent in counseling and coordination of care.  Follow-Up Instructions: No follow-ups on file.   Earnestine Mealing, CMA  Note - This record has been created using Editor, commissioning.  Chart creation errors have been sought, but may not always  have been located. Such creation errors do not reflect on  the standard of medical care.

## 2021-11-25 ENCOUNTER — Ambulatory Visit: Payer: Medicare Other | Admitting: Physician Assistant

## 2021-11-25 DIAGNOSIS — D696 Thrombocytopenia, unspecified: Secondary | ICD-10-CM

## 2021-11-25 DIAGNOSIS — M1711 Unilateral primary osteoarthritis, right knee: Secondary | ICD-10-CM

## 2021-11-25 DIAGNOSIS — I358 Other nonrheumatic aortic valve disorders: Secondary | ICD-10-CM

## 2021-11-25 DIAGNOSIS — G629 Polyneuropathy, unspecified: Secondary | ICD-10-CM

## 2021-11-25 DIAGNOSIS — I10 Essential (primary) hypertension: Secondary | ICD-10-CM

## 2021-11-25 DIAGNOSIS — N183 Chronic kidney disease, stage 3 unspecified: Secondary | ICD-10-CM

## 2021-11-25 DIAGNOSIS — I5032 Chronic diastolic (congestive) heart failure: Secondary | ICD-10-CM

## 2021-11-25 DIAGNOSIS — M5136 Other intervertebral disc degeneration, lumbar region: Secondary | ICD-10-CM

## 2021-11-25 DIAGNOSIS — M5030 Other cervical disc degeneration, unspecified cervical region: Secondary | ICD-10-CM

## 2021-11-25 DIAGNOSIS — I2510 Atherosclerotic heart disease of native coronary artery without angina pectoris: Secondary | ICD-10-CM

## 2021-11-25 DIAGNOSIS — C3491 Malignant neoplasm of unspecified part of right bronchus or lung: Secondary | ICD-10-CM

## 2021-11-25 DIAGNOSIS — Z79899 Other long term (current) drug therapy: Secondary | ICD-10-CM

## 2021-11-25 DIAGNOSIS — K274 Chronic or unspecified peptic ulcer, site unspecified, with hemorrhage: Secondary | ICD-10-CM

## 2021-11-25 DIAGNOSIS — I214 Non-ST elevation (NSTEMI) myocardial infarction: Secondary | ICD-10-CM

## 2021-11-25 DIAGNOSIS — G4733 Obstructive sleep apnea (adult) (pediatric): Secondary | ICD-10-CM

## 2021-11-25 DIAGNOSIS — E1042 Type 1 diabetes mellitus with diabetic polyneuropathy: Secondary | ICD-10-CM

## 2021-11-25 DIAGNOSIS — I493 Ventricular premature depolarization: Secondary | ICD-10-CM

## 2021-11-25 DIAGNOSIS — M1A09X0 Idiopathic chronic gout, multiple sites, without tophus (tophi): Secondary | ICD-10-CM

## 2021-11-25 DIAGNOSIS — R413 Other amnesia: Secondary | ICD-10-CM

## 2021-12-12 ENCOUNTER — Ambulatory Visit: Payer: Medicare Other | Admitting: Endocrinology

## 2021-12-14 DEATH — deceased

## 2021-12-17 ENCOUNTER — Ambulatory Visit: Payer: Medicare Other | Admitting: Cardiology

## 2022-03-06 ENCOUNTER — Ambulatory Visit: Payer: Medicare Other | Admitting: Internal Medicine

## 2022-04-06 ENCOUNTER — Encounter (INDEPENDENT_AMBULATORY_CARE_PROVIDER_SITE_OTHER): Payer: Medicare Other | Admitting: Ophthalmology

## 2022-04-15 ENCOUNTER — Encounter (INDEPENDENT_AMBULATORY_CARE_PROVIDER_SITE_OTHER): Payer: Medicare Other | Admitting: Ophthalmology

## 2023-01-21 ENCOUNTER — Encounter (HOSPITAL_COMMUNITY): Payer: Self-pay | Admitting: *Deleted

## 2023-11-03 NOTE — Telephone Encounter (Signed)
Telephone call
